# Patient Record
Sex: Female | Born: 1960 | Race: Black or African American | Hispanic: No
Health system: Southern US, Community
[De-identification: ages and names within clinical notes are randomized; demographics above are authoritative.]

## PROBLEM LIST (undated history)

## (undated) DIAGNOSIS — I350 Nonrheumatic aortic (valve) stenosis: Secondary | ICD-10-CM

## (undated) DIAGNOSIS — J449 Chronic obstructive pulmonary disease, unspecified: Secondary | ICD-10-CM

## (undated) DIAGNOSIS — E8809 Other disorders of plasma-protein metabolism, not elsewhere classified: Secondary | ICD-10-CM

## (undated) DIAGNOSIS — G8929 Other chronic pain: Secondary | ICD-10-CM

## (undated) DIAGNOSIS — M1990 Unspecified osteoarthritis, unspecified site: Secondary | ICD-10-CM

## (undated) DIAGNOSIS — I2510 Atherosclerotic heart disease of native coronary artery without angina pectoris: Secondary | ICD-10-CM

## (undated) DIAGNOSIS — L309 Dermatitis, unspecified: Secondary | ICD-10-CM

## (undated) DIAGNOSIS — F5104 Psychophysiologic insomnia: Secondary | ICD-10-CM

## (undated) DIAGNOSIS — N184 Chronic kidney disease, stage 4 (severe): Secondary | ICD-10-CM

## (undated) DIAGNOSIS — Z992 Dependence on renal dialysis: Secondary | ICD-10-CM

## (undated) DIAGNOSIS — B379 Candidiasis, unspecified: Secondary | ICD-10-CM

## (undated) DIAGNOSIS — I340 Nonrheumatic mitral (valve) insufficiency: Secondary | ICD-10-CM

## (undated) DIAGNOSIS — D649 Anemia, unspecified: Secondary | ICD-10-CM

## (undated) DIAGNOSIS — G4730 Sleep apnea, unspecified: Secondary | ICD-10-CM

## (undated) DIAGNOSIS — E669 Obesity, unspecified: Secondary | ICD-10-CM

## (undated) DIAGNOSIS — M549 Dorsalgia, unspecified: Secondary | ICD-10-CM

## (undated) DIAGNOSIS — D638 Anemia in other chronic diseases classified elsewhere: Secondary | ICD-10-CM

## (undated) DIAGNOSIS — N189 Chronic kidney disease, unspecified: Secondary | ICD-10-CM

## (undated) DIAGNOSIS — N186 End stage renal disease: Secondary | ICD-10-CM

## (undated) DIAGNOSIS — M109 Gout, unspecified: Secondary | ICD-10-CM

## (undated) DIAGNOSIS — J189 Pneumonia, unspecified organism: Secondary | ICD-10-CM

## (undated) DIAGNOSIS — F419 Anxiety disorder, unspecified: Secondary | ICD-10-CM

## (undated) DIAGNOSIS — Z8719 Personal history of other diseases of the digestive system: Secondary | ICD-10-CM

## (undated) DIAGNOSIS — K219 Gastro-esophageal reflux disease without esophagitis: Secondary | ICD-10-CM

## (undated) DIAGNOSIS — I050 Rheumatic mitral stenosis: Secondary | ICD-10-CM

## (undated) DIAGNOSIS — Z720 Tobacco use: Secondary | ICD-10-CM

## (undated) DIAGNOSIS — E871 Hypo-osmolality and hyponatremia: Secondary | ICD-10-CM

## (undated) DIAGNOSIS — I739 Peripheral vascular disease, unspecified: Secondary | ICD-10-CM

## (undated) HISTORY — DX: Dermatitis, unspecified: L30.9

## (undated) HISTORY — DX: Psychophysiologic insomnia: F51.04

## (undated) SURGERY — ESOPHAGOGASTRODUODENOSCOPY (EGD) WITH PROPOFOL
Anesthesia: Monitor Anesthesia Care | Laterality: Left

---

## 1986-02-10 HISTORY — PX: DILATION AND CURETTAGE OF UTERUS: SHX78

## 1988-02-11 HISTORY — PX: TUBAL LIGATION: SHX77

## 2006-12-30 ENCOUNTER — Emergency Department (HOSPITAL_COMMUNITY): Admission: EM | Admit: 2006-12-30 | Discharge: 2006-12-30 | Payer: Self-pay | Admitting: Emergency Medicine

## 2008-03-06 ENCOUNTER — Ambulatory Visit: Payer: Self-pay | Admitting: Family Medicine

## 2008-03-06 DIAGNOSIS — M545 Low back pain, unspecified: Secondary | ICD-10-CM | POA: Insufficient documentation

## 2008-03-06 DIAGNOSIS — E119 Type 2 diabetes mellitus without complications: Secondary | ICD-10-CM | POA: Insufficient documentation

## 2008-03-06 DIAGNOSIS — G4700 Insomnia, unspecified: Secondary | ICD-10-CM | POA: Insufficient documentation

## 2008-03-06 DIAGNOSIS — J45909 Unspecified asthma, uncomplicated: Secondary | ICD-10-CM | POA: Insufficient documentation

## 2008-03-06 DIAGNOSIS — L259 Unspecified contact dermatitis, unspecified cause: Secondary | ICD-10-CM | POA: Insufficient documentation

## 2008-03-06 DIAGNOSIS — I10 Essential (primary) hypertension: Secondary | ICD-10-CM | POA: Insufficient documentation

## 2008-03-06 LAB — CONVERTED CEMR LAB
Blood Glucose, Fingerstick: 264
Hgb A1c MFr Bld: 7.7 %

## 2008-04-24 ENCOUNTER — Telehealth (INDEPENDENT_AMBULATORY_CARE_PROVIDER_SITE_OTHER): Payer: Self-pay | Admitting: *Deleted

## 2008-05-11 ENCOUNTER — Emergency Department (HOSPITAL_COMMUNITY): Admission: EM | Admit: 2008-05-11 | Discharge: 2008-05-11 | Payer: Self-pay | Admitting: Emergency Medicine

## 2008-07-28 ENCOUNTER — Encounter
Admission: RE | Admit: 2008-07-28 | Discharge: 2008-08-01 | Payer: Self-pay | Admitting: Physical Medicine & Rehabilitation

## 2008-08-01 ENCOUNTER — Ambulatory Visit: Payer: Self-pay | Admitting: Physical Medicine & Rehabilitation

## 2008-08-09 ENCOUNTER — Ambulatory Visit (HOSPITAL_COMMUNITY)
Admission: RE | Admit: 2008-08-09 | Discharge: 2008-08-09 | Payer: Self-pay | Admitting: Physical Medicine & Rehabilitation

## 2008-08-10 ENCOUNTER — Ambulatory Visit (HOSPITAL_COMMUNITY)
Admission: RE | Admit: 2008-08-10 | Discharge: 2008-08-10 | Payer: Self-pay | Admitting: Physical Medicine & Rehabilitation

## 2009-01-20 ENCOUNTER — Emergency Department (HOSPITAL_COMMUNITY): Admission: EM | Admit: 2009-01-20 | Discharge: 2009-01-20 | Payer: Self-pay | Admitting: Emergency Medicine

## 2009-04-06 ENCOUNTER — Emergency Department (HOSPITAL_COMMUNITY): Admission: EM | Admit: 2009-04-06 | Discharge: 2009-04-06 | Payer: Self-pay | Admitting: Emergency Medicine

## 2010-03-16 ENCOUNTER — Emergency Department (HOSPITAL_COMMUNITY)
Admission: EM | Admit: 2010-03-16 | Discharge: 2010-03-16 | Disposition: A | Discharge status: Home / Self Care | Payer: Medicare HMO | Attending: Emergency Medicine | Admitting: Emergency Medicine

## 2010-03-16 DIAGNOSIS — J45909 Unspecified asthma, uncomplicated: Secondary | ICD-10-CM | POA: Insufficient documentation

## 2010-03-16 DIAGNOSIS — R51 Headache: Secondary | ICD-10-CM | POA: Insufficient documentation

## 2010-03-16 DIAGNOSIS — R1110 Vomiting, unspecified: Secondary | ICD-10-CM | POA: Insufficient documentation

## 2010-03-16 DIAGNOSIS — E119 Type 2 diabetes mellitus without complications: Secondary | ICD-10-CM | POA: Insufficient documentation

## 2010-03-16 DIAGNOSIS — H53149 Visual discomfort, unspecified: Secondary | ICD-10-CM | POA: Insufficient documentation

## 2010-03-16 DIAGNOSIS — I10 Essential (primary) hypertension: Secondary | ICD-10-CM | POA: Insufficient documentation

## 2010-03-16 LAB — POCT I-STAT, CHEM 8
BUN: 11 mg/dL (ref 6–23)
Calcium, Ion: 1.17 mmol/L (ref 1.12–1.32)
Chloride: 100 mEq/L (ref 96–112)
Creatinine, Ser: 0.9 mg/dL (ref 0.4–1.2)
Glucose, Bld: 213 mg/dL — ABNORMAL HIGH (ref 70–99)
HCT: 38 % (ref 36.0–46.0)
Hemoglobin: 12.9 g/dL (ref 12.0–15.0)
Potassium: 3.3 mEq/L — ABNORMAL LOW (ref 3.5–5.1)
Sodium: 138 mEq/L (ref 135–145)
TCO2: 28 mmol/L (ref 0–100)

## 2010-03-16 LAB — GLUCOSE, CAPILLARY: Glucose-Capillary: 237 mg/dL — ABNORMAL HIGH (ref 70–99)

## 2010-05-01 LAB — URINE CULTURE: Colony Count: 10000

## 2010-05-01 LAB — POCT I-STAT, CHEM 8
BUN: 8 mg/dL (ref 6–23)
Calcium, Ion: 1.21 mmol/L (ref 1.12–1.32)
Chloride: 101 mEq/L (ref 96–112)
Creatinine, Ser: 1 mg/dL (ref 0.4–1.2)
Glucose, Bld: 209 mg/dL — ABNORMAL HIGH (ref 70–99)
HCT: 35 % — ABNORMAL LOW (ref 36.0–46.0)
Hemoglobin: 11.9 g/dL — ABNORMAL LOW (ref 12.0–15.0)
Potassium: 3.8 mEq/L (ref 3.5–5.1)
Sodium: 134 mEq/L — ABNORMAL LOW (ref 135–145)
TCO2: 30 mmol/L (ref 0–100)

## 2010-05-01 LAB — URINALYSIS, ROUTINE W REFLEX MICROSCOPIC
Bilirubin Urine: NEGATIVE
Glucose, UA: NEGATIVE mg/dL
Hgb urine dipstick: NEGATIVE
Ketones, ur: 15 mg/dL — AB
Nitrite: NEGATIVE
Protein, ur: >300 mg/dL — AB
Specific Gravity, Urine: 1.03 (ref 1.005–1.030)
Urobilinogen, UA: 1 mg/dL (ref 0.0–1.0)
pH: 7.5 (ref 5.0–8.0)

## 2010-05-01 LAB — URINE MICROSCOPIC-ADD ON

## 2010-05-14 LAB — RAPID STREP SCREEN (MED CTR MEBANE ONLY): Streptococcus, Group A Screen (Direct): NEGATIVE

## 2010-06-25 NOTE — Group Therapy Note (Signed)
Jodi Bell is a 50 year old female, who has had back pain since 2002 in  a work-related injury.  She has reportedly had her last MRI in 2007.  I  do not have those records, but she states she left them at home.  She  can get these to [place].  She was told that she has L3, 4, 5 disk  herniations and nonsurgical approach was recommended.  She saw PT, she  had some acupuncture done.  She saw a chiropractor, all of which had  some minimal effect.  She has had epidural injections x2, one time was  helpful and the second time was not.   She has been back in the [place] area last year or so.  I do see  where she was seen in the ED in 2008 for hypertension.  More recently  she was seen in the ED on May 11, 2008, with chronic low back pain as  well as some hand pain and recommendations were to see a rheumatologist;  however, due to no funding did not go.  She has been seeing her primary  care physician initially at [facility], but then now at [facility].   Her average pain is about 8/10, the worse spots are her hips and her  hands as well as a left-sided low back.  Her pain is described as sharp,  stabbing, aching.  In addition she has some numbness in her feet.  She  is a diabetic for about last 3 years.  Sleep is fair.  Pain is worse  with walking, bending, sitting, and standing; improves with rest, heat,  and medications.  Relief from meds is fair.  She can walk 25 minutes at  a time.  She can climb steps.  She drives.  She needs assist with meal  prep, household duties, and shopping.   REVIEW OF SYSTEMS:  Positive for trouble walking, spasms, skin rash,  constipation, poor appetite, limb swelling, and asthma.   PAST MEDICAL HISTORY:  1. Diabetes.  2. Lung problems.  3. Thyroid.  4. High blood pressure.   SOCIAL HISTORY:  Divorced, lives with her sister.  Alcohol use, 2  glasses of wine per week, three-quarter pack smoker per day up until  recently quitting.   FAMILY  HISTORY:  1. Heart disease.  2. Lung disease.  3. Diabetes.  4. High blood pressure.   PHYSICAL EXAMINATION:  VITAL SIGNS:  Blood pressure 151/89, pulse 101,  respirations 18, and O2 sat 97% on room air.  GENERAL:  Overweight female in no acute distress.  NEUROLOGIC:  Orientation x3.  Affect is alert.  MUSCULOSKELETAL:  Gait is normal, although she moves slowly from sit to  stand.  She has good internal rotation and external rotation of the  shoulders.  She has mild deficits that end range for hip  internal/external rotation.  Elbow and hand range of motion are full,  although she states she cannot close her fingers all the way.  She has  good ankle range of motion.  Her deep tendon reflexes are 1+ bilateral  upper and lower extremities.  Sensation is diminished in the feet in a  stocking fashion.  Her hand show no evidence of joint swelling.  No  erythema.  She has a fine maculopapular rash on her back, this is  nonpruritic.  She has no other skin lesions noted.  Phalen and Tinel  signs are negative.   IMPRESSION:  1. Chronic low back pain  likely diskogenic.  2. Hip pain, question early osteoarthritis.  She is overweight.  3. Polyarthralgia, question whether this is inflammatory arthritis.      We will check hand films as well as arthritis panel.  Also in the      differential would include fibromyalgia, however, we first need to      rule out other sources.  Should she develop any joint pain, would      need to be further evaluated.   I discussed findings with the patient.  We will start on tramadol 50  t.i.d.  Non-narcotic management until UDS results are available.  Consider EMG NCV for lower extremities to further evaluate her numbness  in the feet, but this is most likely diabetic related.      Charlett Blake, M.D.  Electronically Signed     AEK/MedQ  D:  08/01/2008 09:40:12  T:  08/01/2008 23:10:21  Job #:  [id number]

## 2010-06-26 ENCOUNTER — Inpatient Hospital Stay (INDEPENDENT_AMBULATORY_CARE_PROVIDER_SITE_OTHER)
Admission: RE | Admit: 2010-06-26 | Discharge: 2010-06-26 | Disposition: A | Discharge status: Home / Self Care | Payer: Medicare HMO | Source: Ambulatory Visit | Attending: Family Medicine | Admitting: Family Medicine

## 2010-06-26 DIAGNOSIS — L02219 Cutaneous abscess of trunk, unspecified: Secondary | ICD-10-CM

## 2010-06-26 DIAGNOSIS — L03319 Cellulitis of trunk, unspecified: Secondary | ICD-10-CM

## 2010-06-30 ENCOUNTER — Emergency Department (HOSPITAL_COMMUNITY)
Admission: EM | Admit: 2010-06-30 | Discharge: 2010-06-30 | Disposition: A | Discharge status: Home / Self Care | Payer: Medicare HMO | Attending: Emergency Medicine | Admitting: Emergency Medicine

## 2010-06-30 DIAGNOSIS — G8929 Other chronic pain: Secondary | ICD-10-CM | POA: Insufficient documentation

## 2010-06-30 DIAGNOSIS — L089 Local infection of the skin and subcutaneous tissue, unspecified: Secondary | ICD-10-CM | POA: Insufficient documentation

## 2010-06-30 DIAGNOSIS — Z79899 Other long term (current) drug therapy: Secondary | ICD-10-CM | POA: Insufficient documentation

## 2010-06-30 DIAGNOSIS — Z7982 Long term (current) use of aspirin: Secondary | ICD-10-CM | POA: Insufficient documentation

## 2010-06-30 DIAGNOSIS — J45909 Unspecified asthma, uncomplicated: Secondary | ICD-10-CM | POA: Insufficient documentation

## 2010-06-30 DIAGNOSIS — M549 Dorsalgia, unspecified: Secondary | ICD-10-CM | POA: Insufficient documentation

## 2010-06-30 DIAGNOSIS — I10 Essential (primary) hypertension: Secondary | ICD-10-CM | POA: Insufficient documentation

## 2010-06-30 DIAGNOSIS — M25519 Pain in unspecified shoulder: Secondary | ICD-10-CM | POA: Insufficient documentation

## 2010-06-30 DIAGNOSIS — E119 Type 2 diabetes mellitus without complications: Secondary | ICD-10-CM | POA: Insufficient documentation

## 2010-11-06 ENCOUNTER — Encounter: Payer: Self-pay | Admitting: Gastroenterology

## 2010-11-21 ENCOUNTER — Encounter: Payer: Self-pay | Admitting: *Deleted

## 2010-11-29 ENCOUNTER — Ambulatory Visit: Payer: Medicare HMO | Admitting: Gastroenterology

## 2011-05-03 ENCOUNTER — Emergency Department (HOSPITAL_BASED_OUTPATIENT_CLINIC_OR_DEPARTMENT_OTHER)
Admission: EM | Admit: 2011-05-03 | Discharge: 2011-05-03 | Disposition: A | Discharge status: Home / Self Care | Payer: Medicare PPO | Attending: Emergency Medicine | Admitting: Emergency Medicine

## 2011-05-03 ENCOUNTER — Encounter (HOSPITAL_BASED_OUTPATIENT_CLINIC_OR_DEPARTMENT_OTHER): Payer: Self-pay | Admitting: Emergency Medicine

## 2011-05-03 ENCOUNTER — Emergency Department (INDEPENDENT_AMBULATORY_CARE_PROVIDER_SITE_OTHER): Payer: Medicare PPO

## 2011-05-03 DIAGNOSIS — Z79899 Other long term (current) drug therapy: Secondary | ICD-10-CM | POA: Insufficient documentation

## 2011-05-03 DIAGNOSIS — K219 Gastro-esophageal reflux disease without esophagitis: Secondary | ICD-10-CM | POA: Insufficient documentation

## 2011-05-03 DIAGNOSIS — M79609 Pain in unspecified limb: Secondary | ICD-10-CM

## 2011-05-03 DIAGNOSIS — Z862 Personal history of diseases of the blood and blood-forming organs and certain disorders involving the immune mechanism: Secondary | ICD-10-CM | POA: Insufficient documentation

## 2011-05-03 DIAGNOSIS — G4700 Insomnia, unspecified: Secondary | ICD-10-CM | POA: Insufficient documentation

## 2011-05-03 DIAGNOSIS — M25476 Effusion, unspecified foot: Secondary | ICD-10-CM | POA: Insufficient documentation

## 2011-05-03 DIAGNOSIS — M129 Arthropathy, unspecified: Secondary | ICD-10-CM | POA: Insufficient documentation

## 2011-05-03 DIAGNOSIS — I10 Essential (primary) hypertension: Secondary | ICD-10-CM | POA: Insufficient documentation

## 2011-05-03 DIAGNOSIS — Z8639 Personal history of other endocrine, nutritional and metabolic disease: Secondary | ICD-10-CM | POA: Insufficient documentation

## 2011-05-03 DIAGNOSIS — M25473 Effusion, unspecified ankle: Secondary | ICD-10-CM | POA: Insufficient documentation

## 2011-05-03 DIAGNOSIS — M79671 Pain in right foot: Secondary | ICD-10-CM

## 2011-05-03 DIAGNOSIS — J45909 Unspecified asthma, uncomplicated: Secondary | ICD-10-CM | POA: Insufficient documentation

## 2011-05-03 DIAGNOSIS — M7989 Other specified soft tissue disorders: Secondary | ICD-10-CM | POA: Insufficient documentation

## 2011-05-03 DIAGNOSIS — F17200 Nicotine dependence, unspecified, uncomplicated: Secondary | ICD-10-CM | POA: Insufficient documentation

## 2011-05-03 DIAGNOSIS — M7730 Calcaneal spur, unspecified foot: Secondary | ICD-10-CM

## 2011-05-03 HISTORY — DX: Gastro-esophageal reflux disease without esophagitis: K21.9

## 2011-05-03 HISTORY — DX: Candidiasis, unspecified: B37.9

## 2011-05-03 HISTORY — DX: Unspecified osteoarthritis, unspecified site: M19.90

## 2011-05-03 MED ORDER — HYDROCODONE-ACETAMINOPHEN 5-325 MG PO TABS
1.0000 | ORAL_TABLET | Freq: Once | ORAL | Status: AC
  Administered 2011-05-03: 1 via ORAL
  Filled 2011-05-03: qty 1

## 2011-05-03 MED ORDER — HYDROCODONE-ACETAMINOPHEN 5-325 MG PO TABS: 1.0000 | ORAL_TABLET | Freq: Four times a day (QID) | ORAL | Status: DC | PRN

## 2011-05-03 NOTE — ED Provider Notes (Signed)
History     CSN: [id number]  Arrival date & time 05/03/11  1123   First MD Initiated Contact with Patient 05/03/11 1254      Chief Complaint  Patient presents with  . Foot Pain    (Consider location/radiation/quality/duration/timing/severity/associated sxs/prior treatment) Patient is a 51 y.o. female presenting with lower extremity pain. The history is provided by the patient.  Foot Pain This is a new problem. The current episode started yesterday. The problem has been gradually worsening. Pertinent negatives include no chest pain, chills, diaphoresis, fever, joint swelling, myalgias, nausea, numbness or weakness. The symptoms are aggravated by walking. She has tried acetaminophen for the symptoms. The treatment provided mild relief.   Pt has no h/o gout.  No injury to her right foot.  She awoke yesterday morning with swelling on the dorsal aspect of her foot.  No erythema.  Pt is unable to take NSAIDs.  Past Medical History  Diagnosis Date  . Essential hypertension   . Eczema   . Asthma   . Diabetes mellitus   . Chronic insomnia   . Arthritis   . Candida infection   . GERD (gastroesophageal reflux disease)     Past Surgical History  Procedure Date  . Cesarean section   . Tubal ligation     Family History  Problem Relation Age of Onset  . Lung cancer Mother   . Diabetes Sister   . Heart disease Sister   . Thyroid cancer      1/2 sister  . Heart disease      1/2 sister    History  Substance Use Topics  . Smoking status: Current Everyday Smoker  . Smokeless tobacco: Not on file  . Alcohol Use: Yes    OB History    Grav Para Term Preterm Abortions TAB SAB Ect Mult Living                  Review of Systems  Constitutional: Negative for fever, chills and diaphoresis.  Cardiovascular: Negative for chest pain.  Gastrointestinal: Negative for nausea.  Musculoskeletal: Negative for myalgias and joint swelling.  Neurological: Negative for weakness and  numbness.  All other systems reviewed and are negative.    Allergies  Ace inhibitors  Home Medications   Current Outpatient Rx  Name Route Sig Dispense Refill  . ALBUTEROL SULFATE (2.5 MG/3ML) 0.083% IN NEBU Nebulization Take 2.5 mg by nebulization every 6 (six) hours as needed.    Marland Kitchen AMLODIPINE BESYLATE 10 MG PO TABS Oral Take 10 mg by mouth daily.    . ASPIRIN 81 MG PO TABS Oral Take 81 mg by mouth daily.    . CHLORTHALIDONE 25 MG PO TABS Oral Take 25 mg by mouth daily.    Marland Kitchen GABAPENTIN 100 MG PO CAPS Oral Take 100 mg by mouth 3 (three) times daily.    . GLYBURIDE 2.5 MG PO TABS Oral Take 2.5 mg by mouth 3 (three) times daily.    Marland Kitchen LISINOPRIL 20 MG PO TABS Oral Take 20 mg by mouth daily.    Marland Kitchen METFORMIN HCL 1000 MG PO TABS Oral Take 1,000 mg by mouth 2 (two) times daily with a meal.    . METHOTREXATE CHEMO INJECTION (PF) 1 GM **50 MG/ML** Intravenous Inject into the vein once a week.    Marland Kitchen MONTELUKAST SODIUM 10 MG PO TABS Oral Take 10 mg by mouth at bedtime.    . OXYCODONE HCL 15 MG PO TABS Oral Take 15 mg by  mouth 2 (two) times daily as needed.    Marland Kitchen PANTOPRAZOLE SODIUM 40 MG PO TBEC Oral Take 40 mg by mouth daily.    Marland Kitchen PREDNISONE 5 MG PO TABS Oral Take 7.5 mg by mouth daily.    Marland Kitchen ZOLPIDEM TARTRATE 10 MG PO TABS Oral Take 10 mg by mouth at bedtime as needed.    Marland Kitchen HYDROCODONE-ACETAMINOPHEN 5-325 MG PO TABS Oral Take 1 tablet by mouth every 6 (six) hours as needed for pain. 20 tablet 0    BP 149/87  Pulse 88  Temp(Src) 97.9 F (36.6 C) (Oral)  Resp 16  SpO2 97%  Physical Exam  Nursing note and vitals reviewed. Constitutional: She is oriented to person, place, and time. She appears well-developed and well-nourished.  HENT:  Head: Normocephalic and atraumatic.  Eyes: Conjunctivae and EOM are normal. Pupils are equal, round, and reactive to light.  Neck: Normal range of motion. Neck supple.  Cardiovascular: Normal rate and regular rhythm.   Pulmonary/Chest: Effort normal and  breath sounds normal.  Abdominal: Soft. Bowel sounds are normal. She exhibits no distension. There is no tenderness.  Musculoskeletal: Normal range of motion.       Right ankle: She exhibits swelling.       Feet:  Lymphadenopathy:    She has no cervical adenopathy.  Neurological: She is alert and oriented to person, place, and time. She has normal reflexes.  Skin: Skin is warm and dry. No rash noted. No erythema.  Psychiatric: She has a normal mood and affect. Her behavior is normal.    ED Course  Procedures (including critical care time)  Labs Reviewed - No data to display Dg Foot Complete Right  05/03/2011  *RADIOLOGY REPORT*  Clinical Data: 3-day history of right foot pain bilaterally.  No known injury.  RIGHT FOOT COMPLETE - 3+ VIEW 1013:  Comparison: None.  Findings: No evidence of acute or subacute fracture or dislocation. Well-preserved bone mineral density.  Well-preserved joint spaces. Moderate sized plantar calcaneal spur.  Spur/enthesopathy at the insertion of the Achilles tendon on the posterior calcaneus. Accessory ossicles adjacent to the navicular and cuboid bones. Calcification involving the small arteries of the foot as can be seen in patients with diabetes and/or end-stage renal disease.  IMPRESSION: No acute or subacute osseous abnormality.  Moderate sized plantar calcaneal spur.  Original Report Authenticated By: Deniece Portela, M.D.     1. Foot pain, right       MDM  Reviewed pt's xrays which showed no acute abnormality.  Advised RICE.  Given Norco since pt is unable to take NSAIDs.  Advised to f/u with PCP if sx persist or worsen.  If pt develops leg swelling or more severe pain, return to the ED.  Pt voiced understanding and compliance.        [place], [place] 05/03/11 1415

## 2011-05-03 NOTE — ED Notes (Signed)
Pt having right sided foot pain since Wednesday.  No known injury.  Pt states it seems to be getting worse.  Pt states no bruising or redness, just sore and some swelling.

## 2011-05-04 NOTE — ED Provider Notes (Signed)
History/physical exam/procedure(s) were performed by non-physician practitioner and as supervising physician I was immediately available for consultation/collaboration. I have reviewed all notes and am in agreement with care and plan.   Shaune Pollack, MD 05/04/11 [phone]

## 2011-05-06 ENCOUNTER — Ambulatory Visit (INDEPENDENT_AMBULATORY_CARE_PROVIDER_SITE_OTHER): Payer: Medicare PPO | Admitting: Family Medicine

## 2011-05-06 VITALS — BP 144/85 | HR 91 | Temp 98.0°F | Resp 16 | Ht 62.5 in | Wt 196.8 lb

## 2011-05-06 DIAGNOSIS — M25579 Pain in unspecified ankle and joints of unspecified foot: Secondary | ICD-10-CM

## 2011-05-06 DIAGNOSIS — L4050 Arthropathic psoriasis, unspecified: Secondary | ICD-10-CM

## 2011-05-06 DIAGNOSIS — M129 Arthropathy, unspecified: Secondary | ICD-10-CM

## 2011-05-06 DIAGNOSIS — M1990 Unspecified osteoarthritis, unspecified site: Secondary | ICD-10-CM

## 2011-05-06 LAB — POCT CBC
Granulocyte percent: 78.3 %G (ref 37–80)
HCT, POC: 34.3 % — AB (ref 37.7–47.9)
Hemoglobin: 10.8 g/dL — AB (ref 12.2–16.2)
Lymph, poc: 1.9 (ref 0.6–3.4)
MCH, POC: 28.1 pg (ref 27–31.2)
MCHC: 31.5 g/dL — AB (ref 31.8–35.4)
MCV: 89.3 fL (ref 80–97)
MID (cbc): 0.8 (ref 0–0.9)
MPV: 7.4 fL (ref 0–99.8)
POC Granulocyte: 9.6 — AB (ref 2–6.9)
POC LYMPH PERCENT: 15.2 %L (ref 10–50)
POC MID %: 6.5 %M (ref 0–12)
Platelet Count, POC: 410 10*3/uL (ref 142–424)
RBC: 3.84 M/uL — AB (ref 4.04–5.48)
RDW, POC: 19 %
WBC: 12.2 10*3/uL — AB (ref 4.6–10.2)

## 2011-05-06 LAB — POCT SEDIMENTATION RATE: POCT SED RATE: 87 mm/hr — AB (ref 0–22)

## 2011-05-06 LAB — URIC ACID: Uric Acid, Serum: 11.1 mg/dL — ABNORMAL HIGH (ref 2.4–7.0)

## 2011-05-06 MED ORDER — COLCHICINE 0.6 MG PO TABS: ORAL_TABLET | ORAL | Status: DC

## 2011-05-06 MED ORDER — CEPHALEXIN 500 MG PO CAPS: 500.0000 mg | ORAL_CAPSULE | Freq: Three times a day (TID) | ORAL | Status: AC

## 2011-05-06 NOTE — Progress Notes (Signed)
  Subjective:    Patient ID: Jodi Bell, female    DOB: [birth year], 51 y.o.   MRN: [id number]  Foot Pain Pertinent negatives include no chills or fever.    Patient presents with 6 days history of progressive pain, redness and swelling involving (R) foot. Seen at [facility] on Saturday X ray's were taken and were negative.  Patient was concerned that she was  developing cellulitis and started herself on leftover Keflex.  Called her Rheumatologist(Dr. Firefighter) for antibiotic  refill however he requested patient to be seen for evaluation of possible cellulitis verses inflammatory arthropathy.   No history of gout; known psoriatic arthropathy  Recent EGD; biopsies showed candidal infection. Patient currently completing course of Nystatin Review of Systems  Constitutional: Negative for fever and chills.       Objective:   Physical Exam  Constitutional: She appears well-developed and well-nourished.  Cardiovascular: Normal rate, regular rhythm, normal heart sounds and intact distal pulses.   Pulmonary/Chest: Effort normal and breath sounds normal.  Musculoskeletal:       Right foot: She exhibits tenderness and swelling.       Feet:  Neurological: She is alert.  Skin: There is erythema.  Psychiatric: She has a normal mood and affect.     Results for orders placed in visit on 05/06/11  POCT CBC      Component Value Range   WBC 12.2 (*) 4.6 - 10.2 (K/uL)   Lymph, poc 1.9  0.6 - 3.4    POC LYMPH PERCENT 15.2  10 - 50 (%L)   MID (cbc) 0.8  0 - 0.9    POC MID % 6.5  0 - 12 (%M)   POC Granulocyte 9.6 (*) 2 - 6.9    Granulocyte percent 78.3  37 - 80 (%G)   RBC 3.84 (*) 4.04 - 5.48 (M/uL)   Hemoglobin 10.8 (*) 12.2 - 16.2 (g/dL)   HCT, POC 34.3 (*) 37.7 - 47.9 (%)   MCV 89.3  80 - 97 (fL)   MCH, POC 28.1  27 - 31.2 (pg)   MCHC 31.5 (*) 31.8 - 35.4 (g/dL)   RDW, POC 19.0     Platelet Count, POC 410  142 - 424 (K/uL)   MPV 7.4  0 - 99.8 (fL)  POCT SEDIMENTATION RATE      Component  Value Range   POCT SED RATE 87 (*) 0 - 22 (mm/hr)        Assessment & Plan:   1. Psoriasis arthropathica    2. 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

## 2011-07-17 ENCOUNTER — Emergency Department (HOSPITAL_COMMUNITY)
Admission: EM | Admit: 2011-07-17 | Discharge: 2011-07-17 | Disposition: A | Discharge status: Home / Self Care | Payer: Medicare PPO | Attending: Emergency Medicine | Admitting: Emergency Medicine

## 2011-07-17 ENCOUNTER — Encounter (HOSPITAL_COMMUNITY): Payer: Self-pay | Admitting: *Deleted

## 2011-07-17 ENCOUNTER — Emergency Department (HOSPITAL_COMMUNITY): Payer: Medicare PPO

## 2011-07-17 DIAGNOSIS — J45909 Unspecified asthma, uncomplicated: Secondary | ICD-10-CM | POA: Insufficient documentation

## 2011-07-17 DIAGNOSIS — G4700 Insomnia, unspecified: Secondary | ICD-10-CM | POA: Insufficient documentation

## 2011-07-17 DIAGNOSIS — I10 Essential (primary) hypertension: Secondary | ICD-10-CM | POA: Insufficient documentation

## 2011-07-17 DIAGNOSIS — M25572 Pain in left ankle and joints of left foot: Secondary | ICD-10-CM

## 2011-07-17 DIAGNOSIS — F17200 Nicotine dependence, unspecified, uncomplicated: Secondary | ICD-10-CM | POA: Insufficient documentation

## 2011-07-17 DIAGNOSIS — M25579 Pain in unspecified ankle and joints of unspecified foot: Secondary | ICD-10-CM | POA: Insufficient documentation

## 2011-07-17 DIAGNOSIS — E119 Type 2 diabetes mellitus without complications: Secondary | ICD-10-CM | POA: Insufficient documentation

## 2011-07-17 DIAGNOSIS — K219 Gastro-esophageal reflux disease without esophagitis: Secondary | ICD-10-CM | POA: Insufficient documentation

## 2011-07-17 DIAGNOSIS — Z79899 Other long term (current) drug therapy: Secondary | ICD-10-CM | POA: Insufficient documentation

## 2011-07-17 DIAGNOSIS — Z8739 Personal history of other diseases of the musculoskeletal system and connective tissue: Secondary | ICD-10-CM | POA: Insufficient documentation

## 2011-07-17 MED ORDER — HYDROCODONE-ACETAMINOPHEN 5-325 MG PO TABS
1.0000 | ORAL_TABLET | Freq: Once | ORAL | Status: AC
  Administered 2011-07-17: 1 via ORAL
  Filled 2011-07-17: qty 1

## 2011-07-17 MED ORDER — HYDROCODONE-ACETAMINOPHEN 5-325 MG PO TABS: 1.0000 | ORAL_TABLET | Freq: Four times a day (QID) | ORAL | Status: AC | PRN

## 2011-07-17 NOTE — ED Provider Notes (Signed)
History     CSN: [id number]  Arrival date & time 07/17/11  1605   First MD Initiated Contact with Patient 07/17/11 1637      Chief Complaint  Patient presents with  . Ankle Pain    (Consider location/radiation/quality/duration/timing/severity/associated sxs/prior treatment) HPI Comments: Patient reports she previously tore a tendon in her left ankle after a muscle spasm about 1 year ago - diagnosed by MRI, treated conservatively with boot.  Pt states yesterday she woke up with increased swelling in her lateral left ankle.  Reports pain is constant, throbbing, radiates into left lower leg, exacerbated by palpation or applying pressure to her foot.  Denies foot, calf, or knee pain.  Denies any injury.  Pt states she does have chronic pain in this ankle when the weather is bad, but never like this.  Denies fevers.    Patient is a 51 y.o. female presenting with ankle pain. The history is provided by the patient.  Ankle Pain  Pertinent negatives include no numbness.    Past Medical History  Diagnosis Date  . Essential hypertension   . Eczema   . Asthma   . Diabetes mellitus   . Chronic insomnia   . Arthritis   . Candida infection   . GERD (gastroesophageal reflux disease)     Past Surgical History  Procedure Date  . Cesarean section   . Tubal ligation     Family History  Problem Relation Age of Onset  . Lung cancer Mother   . Diabetes Sister   . Heart disease Sister   . Thyroid cancer      1/2 sister  . Heart disease      1/2 sister    History  Substance Use Topics  . Smoking status: Current Everyday Smoker  . Smokeless tobacco: Not on file  . Alcohol Use: Yes    OB History    Grav Para Term Preterm Abortions TAB SAB Ect Mult Living                  Review of Systems  Musculoskeletal: Negative for back pain.  Skin: Negative for wound.  Neurological: Negative for weakness and numbness.    Allergies  Ace inhibitors  Home Medications   Current  Outpatient Rx  Name Route Sig Dispense Refill  . ALBUTEROL SULFATE HFA 108 (90 BASE) MCG/ACT IN AERS Inhalation Inhale 2 puffs into the lungs every 6 (six) hours as needed. For shortness of breath.    . ALBUTEROL SULFATE (2.5 MG/3ML) 0.083% IN NEBU Nebulization Take 2.5 mg by nebulization every 6 (six) hours as needed. For shortness of breath.    . ALLOPURINOL 100 MG PO TABS Oral Take 100 mg by mouth daily.    Marland Kitchen AMLODIPINE BESYLATE 10 MG PO TABS Oral Take 10 mg by mouth daily.    . ASPIRIN EC 81 MG PO TBEC Oral Take 81 mg by mouth daily.    Marland Kitchen BISOPROLOL FUMARATE 10 MG PO TABS Oral Take 20 mg by mouth daily.    . CHLORTHALIDONE 25 MG PO TABS Oral Take 25 mg by mouth daily.    Marland Kitchen GABAPENTIN 100 MG PO CAPS Oral Take 100-200 mg by mouth 3 (three) times daily. Take 100 MG twice daily, and 200 MG at bedtime.    . GLYBURIDE 2.5 MG PO TABS Oral Take 2.5-7.5 mg by mouth 2 (two) times daily with a meal. Take 7.5 MG in the morning and 2.5 MG at night.    Marland Kitchen  LISINOPRIL 20 MG PO TABS Oral Take 20 mg by mouth daily.    Marland Kitchen METFORMIN HCL 1000 MG PO TABS Oral Take 1,000 mg by mouth 2 (two) times daily with a meal.    . METHOTREXATE SODIUM CHEMO INJECTION 25 MG/ML PF Intravenous Inject 15 mg into the vein every 7 (seven) days. Inject on Saturdays. 15 MG = 0.6 mL    . MONTELUKAST SODIUM 10 MG PO TABS Oral Take 10 mg by mouth at bedtime.    . OXYCODONE HCL 15 MG PO TABS Oral Take 15 mg by mouth 2 (two) times daily as needed. For pain.    Marland Kitchen PANTOPRAZOLE SODIUM 40 MG PO TBEC Oral Take 40 mg by mouth daily.    Marland Kitchen PREDNISONE 5 MG PO TABS Oral Take 7.5 mg by mouth daily.    Marland Kitchen ZOLPIDEM TARTRATE 10 MG PO TABS Oral Take 10 mg by mouth at bedtime as needed. For sleep.      BP 126/79  Pulse 89  Temp(Src) 98.2 F (36.8 C) (Oral)  Resp 22  SpO2 97%  Physical Exam  Nursing note and vitals reviewed. Constitutional: She appears well-developed and well-nourished. No distress.  HENT:  Head: Normocephalic and atraumatic.    Neck: Neck supple.  Pulmonary/Chest: Effort normal.  Musculoskeletal:       Left knee: Normal.       Left ankle: She exhibits decreased range of motion and swelling. She exhibits no ecchymosis, no deformity, no laceration and normal pulse. tenderness. Lateral malleolus tenderness found. Achilles tendon normal.       Left lower leg: Normal.       Left foot: Normal.       Feet:  Neurological: She is alert.  Skin: She is not diaphoretic.    ED Course  Procedures (including critical care time)  Labs Reviewed - No data to display Dg Ankle Complete Left  07/17/2011  *RADIOLOGY REPORT*  Clinical Data: Lateral pain, remote injury  LEFT ANKLE COMPLETE - 3+ VIEW  Comparison: None  Findings: Soft tissue swelling laterally. Osseous mineralization normal. Joint spaces preserved. Plantar and Achilles insertion calcaneal spur formation. Scattered small vessel vascular calcifications. No acute fracture, dislocation, or bone destruction.  IMPRESSION: No acute osseous abnormalities. Calcaneal spurring.  Original Report Authenticated By: Burnetta Sabin, M.D.   6:11 PM Discussed results with patient.    1. Pain in left ankle       MDM  Patient with swelling and tenderness over lateral malleolus without injury, previous tendon injury in the same location last year without known injury.  Xray is negative.  Pt placed in ASO, given crutches, d/c home with norco #15 and orthopedic follow up.  Return precautions, care instructions given.  Patient verbalizes understanding and agrees with plan.          [place], [place] 07/17/11 2038

## 2011-07-17 NOTE — Discharge Instructions (Signed)
Read the information below.  Follow the RICE treatment as outlined in the attachments.  If you develop fevers, swelling that involves your entire leg, redness, or uncontrolled pain, return to the ER for a recheck.  You may return to the ER at any time for worsening condition or any new symptoms that concern you.   Ankle Pain Ankle pain is a common symptom. The bones, cartilage, tendons, and muscles of the ankle joint perform a lot of work each day. The ankle joint holds your body weight and allows you to move around. Ankle pain can occur on either side or back of 1 or both ankles. Ankle pain may be sharp and burning or dull and aching. There may be tenderness, stiffness, redness, or warmth around the ankle. The pain occurs more often when a person walks or puts pressure on the ankle. CAUSES  There are many reasons ankle pain can develop. It is important to work with your caregiver to identify the cause since many conditions can impact the bones, cartilage, muscles, and tendons. Causes for ankle pain include:  Injury, including a break (fracture), sprain, or strain often due to a fall, sports, or a high-impact activity.   Swelling (inflammation) of a tendon (tendonitis).   Achilles tendon rupture.   Ankle instability after repeated sprains and strains.   Poor foot alignment.   Pressure on a nerve (tarsal tunnel syndrome).   Arthritis in the ankle or the lining of the ankle.   Crystal formation in the ankle (gout or pseudogout).  DIAGNOSIS  A diagnosis is based on your medical history, your symptoms, results of your physical exam, and results of diagnostic tests. Diagnostic tests may include X-ray exams or a computerized magnetic scan (magnetic resonance imaging, MRI). TREATMENT  Treatment will depend on the cause of your ankle pain and may include:  Keeping pressure off the ankle and limiting activities.   Using crutches or other walking support (a cane or brace).   Using rest, ice,  compression, and elevation.   Participating in physical therapy or home exercises.   Wearing shoe inserts or special shoes.   Losing weight.   Taking medications to reduce pain or swelling or receiving an injection.   Undergoing surgery.  HOME CARE INSTRUCTIONS   Only take over-the-counter or prescription medicines for pain, discomfort, or fever as directed by your caregiver.   Put ice on the injured area.   Put ice in a plastic bag.   Place a towel between your skin and the bag.   Leave the ice on for 15 to 20 minutes at a time, 3 to 4 times a day.   Keep your leg raised (elevated) when possible to lessen swelling.   Avoid activities that cause ankle pain.   Follow specific exercises as directed by your caregiver.   Record how often you have ankle pain, the location of the pain, and what it feels like. This information may be helpful to you and your caregiver.   Ask your caregiver about returning to work or sports and whether you should drive.   Follow up with your caregiver for further examination, therapy, or testing as directed.  SEEK MEDICAL CARE IF:   Pain or swelling continues or worsens beyond 1 week.   You have an oral temperature above 102 F (38.9 C).   You are feeling unwell or have chills.   You are having an increasingly difficult time with walking.   You have loss of sensation or other new  symptoms.   You have questions or concerns.  MAKE SURE YOU:   Understand these instructions.   Will watch your condition.   Will get help right away if you are not doing well or get worse.  Document Released: 07/17/2009 Document Revised: 01/16/2011 Document Reviewed: 07/17/2009 [facility] Patient Information 2012 [place].

## 2011-07-17 NOTE — ED Notes (Signed)
Lt ankle pain since yesterday.  No known injury she had tendon repair one year ago

## 2011-07-17 NOTE — Progress Notes (Signed)
Orthopedic Tech Progress Note Patient Details:  ROBINE GASKAMP [birth year] [id number]  Ortho Devices Type of Ortho Device: Crutches;ASO Ortho Device/Splint Location: (L) LE Ortho Device/Splint Interventions: Application   Braulio Bosch 07/17/2011, 6:42 PM

## 2011-07-20 NOTE — ED Provider Notes (Signed)
Medical screening examination/treatment/procedure(s) were performed by non-physician practitioner and as supervising physician I was immediately available for consultation/collaboration.   Maudry Diego, MD 07/20/11 1600

## 2011-09-18 ENCOUNTER — Ambulatory Visit (INDEPENDENT_AMBULATORY_CARE_PROVIDER_SITE_OTHER): Payer: Medicare PPO | Admitting: Family Medicine

## 2011-09-18 VITALS — BP 132/90 | HR 88 | Temp 98.3°F | Resp 16 | Ht 62.5 in | Wt 192.6 lb

## 2011-09-18 DIAGNOSIS — F419 Anxiety disorder, unspecified: Secondary | ICD-10-CM

## 2011-09-18 DIAGNOSIS — E119 Type 2 diabetes mellitus without complications: Secondary | ICD-10-CM

## 2011-09-18 DIAGNOSIS — N289 Disorder of kidney and ureter, unspecified: Secondary | ICD-10-CM

## 2011-09-18 DIAGNOSIS — F411 Generalized anxiety disorder: Secondary | ICD-10-CM

## 2011-09-18 LAB — POCT CBC
Granulocyte percent: 71 %G (ref 37–80)
HCT, POC: 37.1 % — AB (ref 37.7–47.9)
Hemoglobin: 10.8 g/dL — AB (ref 12.2–16.2)
Lymph, poc: 2.3 (ref 0.6–3.4)
MCH, POC: 27 pg (ref 27–31.2)
MCHC: 29.1 g/dL — AB (ref 31.8–35.4)
MCV: 92.8 fL (ref 80–97)
MID (cbc): 0.7 (ref 0–0.9)
MPV: 8.1 fL (ref 0–99.8)
POC Granulocyte: 7.3 — AB (ref 2–6.9)
POC LYMPH PERCENT: 22.6 %L (ref 10–50)
POC MID %: 6.4 %M (ref 0–12)
Platelet Count, POC: 465 10*3/uL — AB (ref 142–424)
RBC: 4 M/uL — AB (ref 4.04–5.48)
RDW, POC: 16.7 %
WBC: 10.3 10*3/uL — AB (ref 4.6–10.2)

## 2011-09-18 LAB — COMPREHENSIVE METABOLIC PANEL
ALT: 37 U/L — ABNORMAL HIGH (ref 0–35)
AST: 28 U/L (ref 0–37)
Albumin: 4.3 g/dL (ref 3.5–5.2)
Alkaline Phosphatase: 65 U/L (ref 39–117)
BUN: 25 mg/dL — ABNORMAL HIGH (ref 6–23)
CO2: 28 mEq/L (ref 19–32)
Calcium: 10.7 mg/dL — ABNORMAL HIGH (ref 8.4–10.5)
Chloride: 98 mEq/L (ref 96–112)
Creat: 1.34 mg/dL — ABNORMAL HIGH (ref 0.50–1.10)
Glucose, Bld: 148 mg/dL — ABNORMAL HIGH (ref 70–99)
Potassium: 3.7 mEq/L (ref 3.5–5.3)
Sodium: 137 mEq/L (ref 135–145)
Total Bilirubin: 0.3 mg/dL (ref 0.3–1.2)
Total Protein: 7.5 g/dL (ref 6.0–8.3)

## 2011-09-18 LAB — GLUCOSE, POCT (MANUAL RESULT ENTRY): POC Glucose: 143 mg/dl — AB (ref 70–99)

## 2011-09-18 LAB — T4: T4, Total: 10.3 ug/dL (ref 5.0–12.5)

## 2011-09-18 LAB — T3, FREE: T3, Free: 2.3 pg/mL (ref 2.3–4.2)

## 2011-09-18 LAB — TSH: TSH: 3.043 u[IU]/mL (ref 0.350–4.500)

## 2011-09-18 LAB — POCT GLYCOSYLATED HEMOGLOBIN (HGB A1C): Hemoglobin A1C: 7.3

## 2011-09-18 MED ORDER — ALPRAZOLAM 0.25 MG PO TABS: ORAL_TABLET | ORAL | Status: DC

## 2011-09-18 NOTE — Progress Notes (Addendum)
[facility] [address] [place] [address] [phone]  Date:  09/18/2011   Name:  Jodi Bell   DOB:  1960-09-25   MRN:  [id number]  PCP:  William Hamburger, MD    Chief Complaint: Anxiety   History of Present Illness:  Jodi Bell is a 51 y.o. very pleasant female patient who presents with the following:  She stopped smoking about 6 weeks ago- came off chantix about a week and a half ago.  However, she notes that she feels "very nervous" over the last 2 days.  She has still been using her Ambien but this is not helping her sleep- she has slept very little.  "Just can't get my mind to shut off."    She also notes "menstual cramps" in her chest, she has had this in the past- when she was in her 3s.  It feels "like a spasm.'  Not having this pain now- it just occurs when she is laying down and trying to sleep.   She also notes tingling in her bilateral hands for the last 2 days- only when supine.  Goes away when she sits up.   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  She had an iodine treatment for her thyroid and has not had the problem since.   She has been under some stress lately- she has used some xanax in the past during hard times.    She has HTN, but no other history of CAD.   She also has noted nausea.  She saw GI a few months ago and had an endoscopy- she was diagnosed with thrush.  This problem has resolved, but she has noted nausea just for the past 2 days.    Menopausal for the last 3 or 4 years  Hg 3/13 was 10.8  Patient Active Problem List  Diagnosis  . DIABETES MELLITUS, TYPE II  . INSOMNIA, CHRONIC  . ESSENTIAL HYPERTENSION  . ASTHMA  . ECZEMA  . BACK PAIN, LUMBAR, CHRONIC  . Psoriasis arthropathica    Past Medical History  Diagnosis Date  . Essential hypertension   . Eczema   . Asthma   . Diabetes mellitus   . Chronic insomnia   . Arthritis   . Candida infection   . GERD (gastroesophageal  reflux disease)     Past Surgical History  Procedure Date  . Cesarean section   . Tubal ligation     History  Substance Use Topics  . Smoking status: Former Smoker    Quit date: 07/19/2011  . Smokeless tobacco: Not on file  . Alcohol Use: Yes    Family History  Problem Relation Age of Onset  . Lung cancer Mother   . Diabetes Sister   . Heart disease Sister   . Thyroid cancer      1/2 sister  . Heart disease      1/2 sister    Allergies  Allergen Reactions  . Ace Inhibitors     REACTION: facial swelling(per pt)    Medication list has been reviewed and updated.  Current Outpatient Prescriptions on File Prior to Visit  Medication Sig Dispense Refill  . albuterol (PROVENTIL HFA;VENTOLIN HFA) 108 (90 BASE) MCG/ACT inhaler Inhale 2 puffs into the lungs every 6 (six) hours as needed. For shortness of breath.      . allopurinol (ZYLOPRIM) 100 MG tablet Take 100 mg by mouth daily.      Marland Kitchen  amLODipine (NORVASC) 10 MG tablet Take 10 mg by mouth daily.      Marland Kitchen aspirin EC 81 MG tablet Take 81 mg by mouth daily.      . bisoprolol (ZEBETA) 10 MG tablet Take 20 mg by mouth daily.      . chlorthalidone (HYGROTON) 25 MG tablet Take 25 mg by mouth daily.      Marland Kitchen gabapentin (NEURONTIN) 100 MG capsule Take 100-200 mg by mouth 3 (three) times daily. Take 100 MG twice daily, and 200 MG at bedtime.      Marland Kitchen glyBURIDE (DIABETA) 2.5 MG tablet Take 2.5-7.5 mg by mouth 2 (two) times daily with a meal. Take 7.5 MG in the morning and 2.5 MG at night.      Marland Kitchen lisinopril (PRINIVIL,ZESTRIL) 20 MG tablet Take 20 mg by mouth daily.      . metFORMIN (GLUCOPHAGE) 1000 MG tablet Take 1,000 mg by mouth 2 (two) times daily with a meal.      . methotrexate 25 MG/ML SOLN Inject 15 mg into the vein every 7 (seven) days. Inject on Saturdays. 15 MG = 0.6 mL      . montelukast (SINGULAIR) 10 MG tablet Take 10 mg by mouth at bedtime.      Marland Kitchen oxyCODONE (ROXICODONE) 15 MG immediate release tablet Take 15 mg by mouth 2  (two) times daily as needed. For pain.      . pantoprazole (PROTONIX) 40 MG tablet Take 40 mg by mouth daily.      . predniSONE (DELTASONE) 5 MG tablet Take 7.5 mg by mouth daily.      Marland Kitchen zolpidem (AMBIEN) 10 MG tablet Take 10 mg by mouth at bedtime as needed. For sleep.      Marland Kitchen albuterol (PROVENTIL) (2.5 MG/3ML) 0.083% nebulizer solution Take 2.5 mg by nebulization every 6 (six) hours as needed. For shortness of breath.        Review of Systems:  As per HPI- otherwise negative.   Physical Examination: Filed Vitals:   09/18/11 1327  BP: 132/90  Pulse: 88  Temp: 98.3 F (36.8 C)  Resp: 16   Filed Vitals:   09/18/11 1327  Height: 5' 2.5" (1.588 m)  Weight: 192 lb 9.6 oz (87.363 kg)   Rechecked her pulse ox personally- 100%, pulse 74 Body mass index is 34.67 kg/(m^2). Ideal Body Weight: Weight in (lb) to have BMI = 25: 138.6   GEN: WDWN, NAD, Non-toxic, A & O x 3, obese HEENT: Atraumatic, Normocephalic. Neck supple. No masses, No LAD.  TM and oropharynx wnl, PEERL, EOMI, PEERL, EOMI Ears and Nose: No external deformity. CV: RRR, No M/G/R. No JVD. No thrill. No extra heart sounds. PULM: CTA B, no wheezes, crackles, rhonchi. No retractions. No resp. distress. No accessory muscle use. ABD: S, NT, ND, +BS. No rebound. No HSM. Trunk: able to reproduce soreness in her bilateral pectoralis muscles.  EXTR: No c/c/e.  No abnormalities of hands- no swelling or heat.   NEURO Normal gait.  PSYCH: Normally interactive. Conversant. Mildly anxious  Results for orders placed in visit on 09/18/11  POCT CBC      Component Value Range   WBC 10.3 (*) 4.6 - 10.2 K/uL   Lymph, poc 2.3  0.6 - 3.4   POC LYMPH PERCENT 22.6  10 - 50 %L   MID (cbc) 0.7  0 - 0.9   POC MID % 6.4  0 - 12 %M   POC Granulocyte 7.3 (*) 2 - 6.9  Granulocyte percent 71.0  37 - 80 %G   RBC 4.00 (*) 4.04 - 5.48 M/uL   Hemoglobin 10.8 (*) 12.2 - 16.2 g/dL   HCT, POC 37.1 (*) 37.7 - 47.9 %   MCV 92.8  80 - 97 fL   MCH,  POC 27.0  27 - 31.2 pg   MCHC 29.1 (*) 31.8 - 35.4 g/dL   RDW, POC 16.7     Platelet Count, POC 465 (*) 142 - 424 K/uL   MPV 8.1  0 - 99.8 fL  GLUCOSE, POCT (MANUAL RESULT ENTRY)      Component Value Range   POC Glucose 143 (*) 70 - 99 mg/dl  POCT GLYCOSYLATED HEMOGLOBIN (HGB A1C)      Component Value Range   Hemoglobin A1C 7.3     EKG: NSR, no ST elevation or depression Went over criteria for thyroid storm- she does not fit these criteria Assessment and Plan: 1. Anxiety  EKG 12-Lead, POCT CBC, POCT glucose (manual entry), POCT glycosylated hemoglobin (Hb A1C), Comprehensive metabolic panel, TSH, ALPRAZolam (XANAX) 0.25 MG tablet, T3, Free, T4   Suspect that Erica's symtoms are due to anxiety- she has had these type of symptoms with anxiety in the past.  Will try a low dose of xanax as needed for a few days.  She will let me know if not feeling better- right away if her symptoms get worse or change  Anemia- she has not yet had a colonoscopy.  Stressed the importance of having this test as chronic anemia can be a warning sign of colon problems/ bleeding.    Will follow-up with her other labs- however she certainly is not in thyroid storm as she has no fever, tachycardia, elevated BP, delirium, or other diagnostic symptoms.    Moss Berry, MD  Called her to go over labs- all thyroid labs look ok.  Her BUN and creat are up a bit, as is her calcium  ?dehydration related.  She will RTC for a BMP in the next week or so.  She is feeling much better today.

## 2011-09-19 NOTE — Addendum Note (Signed)
Addended by: Lamar Blinks C on: 09/19/2011 02:40 PM   Modules accepted: Orders

## 2011-10-10 ENCOUNTER — Telehealth: Payer: Self-pay

## 2011-10-10 NOTE — Telephone Encounter (Signed)
The patient called regarding some questions she has about her blood pressure medicine.  Please call the patient at [phone].

## 2011-10-11 NOTE — Telephone Encounter (Signed)
LMOM to CB. 

## 2011-10-13 NOTE — Telephone Encounter (Signed)
Pt reports that her bisoprolol had been switched to Carvedilol and her BP was running higher on this. At last OV Dr Lorelei Pont had wanted her to cont on the carvedilol for a while, but pt wanted her to know that she DCd the carvedilol and restarted bisoprolol on 10/10/11 because her BP was still running in the 150s/high 90s and she was having HAs for several days. She has not taken it yet today, but yesterday after changing back, it was 140/high 80s. Pt reports she is still taking the amlodipine, chlorthalidone and lisinopril as instructed. Pt reports that she did have her RFTs rechecked at [facility] on 8/15 and Creatine was 1.14 and BUN was 24. Pt wanted to check w/Dr Copland to make sure it was OK for her to be back on the bisoprolol and to see if she wanted to adjust any other medications due to her kidney function. Pt is aware that Dr Lorelei Pont will be back in office tomorrow and we will be back in touch w/instr's after her review.

## 2011-10-15 NOTE — Telephone Encounter (Signed)
Called her back- was a bit confused as we had actually not adjusted her BP medications- this was done by her PCP DR. Parks.  However, if the new combination was not controlling he BP it seems reasonable to go back to her old medication regimen.

## 2012-03-18 ENCOUNTER — Other Ambulatory Visit: Payer: Self-pay | Admitting: Nephrology

## 2012-03-18 DIAGNOSIS — N179 Acute kidney failure, unspecified: Secondary | ICD-10-CM

## 2012-03-22 ENCOUNTER — Ambulatory Visit
Admission: RE | Admit: 2012-03-22 | Discharge: 2012-03-22 | Disposition: A | Discharge status: Home / Self Care | Payer: Medicare PPO | Source: Ambulatory Visit | Attending: Nephrology | Admitting: Nephrology

## 2012-03-22 DIAGNOSIS — N179 Acute kidney failure, unspecified: Secondary | ICD-10-CM

## 2012-09-25 ENCOUNTER — Encounter: Payer: Self-pay | Admitting: Family Medicine

## 2012-09-30 ENCOUNTER — Ambulatory Visit (INDEPENDENT_AMBULATORY_CARE_PROVIDER_SITE_OTHER): Payer: Medicare PPO | Admitting: Family Medicine

## 2012-09-30 VITALS — BP 140/90 | HR 95 | Temp 98.4°F | Resp 16 | Ht 62.5 in | Wt 186.6 lb

## 2012-09-30 DIAGNOSIS — E119 Type 2 diabetes mellitus without complications: Secondary | ICD-10-CM

## 2012-09-30 DIAGNOSIS — L500 Allergic urticaria: Secondary | ICD-10-CM

## 2012-09-30 DIAGNOSIS — T7840XA Allergy, unspecified, initial encounter: Secondary | ICD-10-CM

## 2012-09-30 LAB — POCT GLYCOSYLATED HEMOGLOBIN (HGB A1C): Hemoglobin A1C: 6.3

## 2012-09-30 LAB — GLUCOSE, POCT (MANUAL RESULT ENTRY): POC Glucose: 301 mg/dl — AB (ref 70–99)

## 2012-09-30 MED ORDER — EPINEPHRINE 0.3 MG/0.3ML IJ SOAJ: 0.3000 mg | Freq: Once | INTRAMUSCULAR | Status: DC

## 2012-09-30 MED ORDER — METHYLPREDNISOLONE ACETATE 80 MG/ML IJ SUSP
80.0000 mg | Freq: Once | INTRAMUSCULAR | Status: AC
  Administered 2012-09-30: 80 mg via INTRAMUSCULAR

## 2012-09-30 MED ORDER — DIPHENHYDRAMINE HCL 50 MG/ML IJ SOLN
50.0000 mg | Freq: Once | INTRAMUSCULAR | Status: AC
  Administered 2012-09-30: 50 mg via INTRAMUSCULAR

## 2012-09-30 NOTE — Progress Notes (Signed)
[facility]:  Office Visit  Chief Complaint:  Chief Complaint  Patient presents with  . Rash    rash on buttocks and now going down legs since yesterday    HPI: Jodi Bell is a 52 y.o. female who complains of  Rash on her behinds and thought something bit her and now moving down her leg.   She has some on her left arm. THere are more areas affected and she is itching all over. Tried Benadryl at home without releif. She has immuno deficiency Psoriatic Arthritis and is on steroid snad took 2 prednisone 5 mg and thought that would help but it did not. She has not tried any new meds. She was on Lisinopril and taken off and thought that would help but it did not.  She is not SOB, no CP, no voice changes. She has pecan, spicy mustard allergy  She saw an allergist a year ago and she is allergic to everything She has allergies and ususally takes Benadryl  She sees KIm Ernestina Patches for diabetes her HbA1c in March as 6.9   Past Medical History  Diagnosis Date  . Essential hypertension   . Eczema   . Asthma   . Diabetes mellitus   . Chronic insomnia   . Arthritis   . Candida infection   . GERD (gastroesophageal reflux disease)    Past Surgical History  Procedure Laterality Date  . Cesarean section    . Tubal ligation     History   Social History  . Marital Status: Divorced    Spouse Name: N/A    Number of Children: N/A  . Years of Education: N/A   Social History Main Topics  . Smoking status: Former Smoker    Quit date: 07/19/2011  . Smokeless tobacco: Not on file  . Alcohol Use: Yes  . Drug Use: No  . Sexual Activity: Not on file   Other Topics Concern  . Not on file   Social History Narrative  . No narrative on file   Family History  Problem Relation Age of Onset  . Lung cancer Mother   . Diabetes Sister   . Heart disease Sister   . Thyroid cancer      1/2 sister  . Heart disease      1/2 sister   No Active Allergies Prior to Admission  medications   Medication Sig Start Date End Date Taking? Authorizing Provider  albuterol (PROVENTIL HFA;VENTOLIN HFA) 108 (90 BASE) MCG/ACT inhaler Inhale 2 puffs into the lungs every 6 (six) hours as needed. For shortness of breath.   Yes Historical Provider, MD  albuterol (PROVENTIL) (2.5 MG/3ML) 0.083% nebulizer solution Take 2.5 mg by nebulization every 6 (six) hours as needed. For shortness of breath.   Yes Historical Provider, MD  allopurinol (ZYLOPRIM) 100 MG tablet Take 100 mg by mouth daily.   Yes Historical Provider, MD  amLODipine (NORVASC) 10 MG tablet Take 10 mg by mouth daily.   Yes Historical Provider, MD  aspirin EC 81 MG tablet Take 81 mg by mouth daily.   Yes Historical Provider, MD  bisoprolol (ZEBETA) 10 MG tablet Take 20 mg by mouth daily.   Yes Historical Provider, MD  chlorthalidone (HYGROTON) 25 MG tablet Take 25 mg by mouth daily.   Yes Historical Provider, MD  gabapentin (NEURONTIN) 100 MG capsule Take 100-200 mg by mouth 3 (three) times daily. Take 100 MG twice daily, and 200 MG at bedtime.   Yes  Historical Provider, MD  glyBURIDE (DIABETA) 2.5 MG tablet Take 2.5-7.5 mg by mouth 2 (two) times daily with a meal. Take 7.5 MG in the morning and 2.5 MG at night.   Yes Historical Provider, MD  lisinopril (PRINIVIL,ZESTRIL) 20 MG tablet Take 20 mg by mouth daily.   Yes Historical Provider, MD  metFORMIN (GLUCOPHAGE) 1000 MG tablet Take 1,000 mg by mouth 2 (two) times daily with a meal.   Yes Historical Provider, MD  methotrexate 25 MG/ML SOLN Inject 15 mg into the vein every 7 (seven) days. Inject on Saturdays. 15 MG = 0.6 mL   Yes Historical Provider, MD  montelukast (SINGULAIR) 10 MG tablet Take 10 mg by mouth at bedtime.   Yes Historical Provider, MD  oxyCODONE (ROXICODONE) 15 MG immediate release tablet Take 15 mg by mouth 2 (two) times daily as needed. For pain.   Yes Historical Provider, MD  pantoprazole (PROTONIX) 40 MG tablet Take 40 mg by mouth daily.   Yes Historical  Provider, MD  predniSONE (DELTASONE) 5 MG tablet Take 7.5 mg by mouth daily.   Yes Historical Provider, MD  varenicline (CHANTIX) 1 MG tablet Take 1 mg by mouth 2 (two) times daily.   Yes Historical Provider, MD  zolpidem (AMBIEN) 10 MG tablet Take 10 mg by mouth at bedtime as needed. For sleep.   Yes Historical Provider, MD  ALPRAZolam Duanne Moron) 0.25 MG tablet Take one or two every 8 hours as needed for anxiety 09/18/11   Gay Filler Copland, MD     ROS: The patient denies fevers, chills, night sweats, unintentional weight loss, chest pain, palpitations, wheezing, dyspnea on exertion, nausea, vomiting, abdominal pain, dysuria, hematuria, melena, numbness, weakness, or tingling.   All other systems have been reviewed and were otherwise negative with the exception of those mentioned in the HPI and as above.    PHYSICAL EXAM: Filed Vitals:   09/30/12 1807  BP: 140/90  Pulse: 95  Temp: 98.4 F (36.9 C)  Resp: 16   Filed Vitals:   09/30/12 1807  Height: 5' 2.5" (1.588 m)  Weight: 186 lb 9.6 oz (84.641 kg)   Body mass index is 33.56 kg/(m^2).  General: Alert, no acute distress HEENT:  Normocephalic, atraumatic, oropharynx patent. EOMI, PERRLA. Throat is open, no wheezing Cardiovascular:  Regular rate and rhythm, no rubs murmurs or gallops.  No Carotid bruits, radial pulse intact. No pedal edema.  Respiratory: Clear to auscultation bilaterally.  No wheezes, rales, or rhonchi.  No cyanosis, no use of accessory musculature GI: No organomegaly, abdomen is soft and non-tender, positive bowel sounds.  No masses. Skin: + urticarial rash on buttocks, on left arm.  Neurologic: Facial musculature symmetric. Psychiatric: Patient is appropriate throughout our interaction. Lymphatic: No cervical lymphadenopathy Musculoskeletal: Gait intact.   LABS: Results for orders placed in visit on 09/30/12  GLUCOSE, POCT (MANUAL RESULT ENTRY)      Result Value Range   POC Glucose 301 (*) 70 - 99 mg/dl  POCT  GLYCOSYLATED HEMOGLOBIN (HGB A1C)      Result Value Range   Hemoglobin A1C 6.3       EKG/XRAY:   Primary read interpreted by Dr. Marin Comment at [facility].   ASSESSMENT/PLAN: Encounter Diagnoses  Name Primary?  . Allergic reaction, initial encounter Yes  . Allergic urticaria   . Diabetes    No airway obstruction Benadryl 50 mg IM x 1 in office Depomedrol 80 mg IM x 1 Rx Epi pen Benadryl 50 mg scheduled until feesl better  F/u prn or go to ER prn F/u  With allergist Gross sideeffects, risk and benefits, and alternatives of medications d/w patient. Patient is aware that all medications have potential sideeffects and we are unable to predict every sideeffect or drug-drug interaction that may occur.  LE, [place], DO 09/30/2012 6:59 PM

## 2012-09-30 NOTE — Patient Instructions (Signed)
Hives Hives are itchy, red, swollen areas of the skin. They can vary in size and location on your body. Hives can come and go for hours or several days (acute hives) or for several weeks (chronic hives). Hives do not spread from person to person (noncontagious). They may get worse with scratching, exercise, and emotional stress. CAUSES   Allergic reaction to food, additives, or drugs.  Infections, including the common cold.  Illness, such as vasculitis, lupus, or thyroid disease.  Exposure to sunlight, heat, or cold.  Exercise.  Stress.  Contact with chemicals. SYMPTOMS   Red or white swollen patches on the skin. The patches may change size, shape, and location quickly and repeatedly.  Itching.  Swelling of the hands, feet, and face. This may occur if hives develop deeper in the skin. DIAGNOSIS  Your caregiver can usually tell what is wrong by performing a physical exam. Skin or blood tests may also be done to determine the cause of your hives. In some cases, the cause cannot be determined. TREATMENT  Mild cases usually get better with medicines such as antihistamines. Severe cases may require an emergency epinephrine injection. If the cause of your hives is known, treatment includes avoiding that trigger.  HOME CARE INSTRUCTIONS   Avoid causes that trigger your hives.  Take antihistamines as directed by your caregiver to reduce the severity of your hives. Non-sedating or low-sedating antihistamines are usually recommended. Do not drive while taking an antihistamine.  Take any other medicines prescribed for itching as directed by your caregiver.  Wear loose-fitting clothing.  Keep all follow-up appointments as directed by your caregiver. SEEK MEDICAL CARE IF:   You have persistent or severe itching that is not relieved with medicine.  You have painful or swollen joints. SEEK IMMEDIATE MEDICAL CARE IF:   You have a fever.  Your tongue or lips are swollen.  You have  trouble breathing or swallowing.  You feel tightness in the throat or chest.  You have abdominal pain. These problems may be the first sign of a life-threatening allergic reaction. Call your local emergency services (911 in [place] MAKE SURE YOU:   Understand these instructions.  Will watch your condition.  Will get help right away if you are not doing well or get worse. Document Released: 01/27/2005 Document Revised: 07/29/2011 Document Reviewed: 04/22/2011 [employer] Patient Information 2014 [employer].  

## 2012-10-12 ENCOUNTER — Ambulatory Visit (INDEPENDENT_AMBULATORY_CARE_PROVIDER_SITE_OTHER): Payer: Medicare PPO | Admitting: Internal Medicine

## 2012-10-12 VITALS — BP 118/68 | HR 91 | Temp 99.3°F | Resp 16 | Ht 62.5 in | Wt 184.0 lb

## 2012-10-12 DIAGNOSIS — R059 Cough, unspecified: Secondary | ICD-10-CM

## 2012-10-12 DIAGNOSIS — R739 Hyperglycemia, unspecified: Secondary | ICD-10-CM

## 2012-10-12 DIAGNOSIS — R7309 Other abnormal glucose: Secondary | ICD-10-CM

## 2012-10-12 DIAGNOSIS — K219 Gastro-esophageal reflux disease without esophagitis: Secondary | ICD-10-CM

## 2012-10-12 DIAGNOSIS — J029 Acute pharyngitis, unspecified: Secondary | ICD-10-CM

## 2012-10-12 DIAGNOSIS — E119 Type 2 diabetes mellitus without complications: Secondary | ICD-10-CM

## 2012-10-12 DIAGNOSIS — R509 Fever, unspecified: Secondary | ICD-10-CM

## 2012-10-12 DIAGNOSIS — R05 Cough: Secondary | ICD-10-CM

## 2012-10-12 DIAGNOSIS — J039 Acute tonsillitis, unspecified: Secondary | ICD-10-CM

## 2012-10-12 LAB — POCT CBC
Granulocyte percent: 83.3 % — AB (ref 37–80)
HCT, POC: 33.8 % — AB (ref 37.7–47.9)
Hemoglobin: 10.7 g/dL — AB (ref 12.2–16.2)
Lymph, poc: 1.4 (ref 0.6–3.4)
MCH, POC: 29.9 pg (ref 27–31.2)
MCHC: 31.7 g/dL — AB (ref 31.8–35.4)
MCV: 94.5 fL (ref 80–97)
MID (cbc): 0.8 (ref 0–0.9)
MPV: 8.4 fL (ref 0–99.8)
POC Granulocyte: 11 — AB (ref 2–6.9)
POC LYMPH PERCENT: 10.9 % (ref 10–50)
POC MID %: 5.8 % (ref 0–12)
Platelet Count, POC: 324 10*3/uL (ref 142–424)
RBC: 3.58 M/uL — AB (ref 4.04–5.48)
RDW, POC: 19 %
WBC: 13.2 10*3/uL — AB (ref 4.6–10.2)

## 2012-10-12 LAB — GLUCOSE, POCT (MANUAL RESULT ENTRY): POC Glucose: 124 mg/dL — AB (ref 70–99)

## 2012-10-12 LAB — POCT RAPID STREP A (OFFICE): Rapid Strep A Screen: NEGATIVE

## 2012-10-12 MED ORDER — ESOMEPRAZOLE MAGNESIUM 40 MG PO CPDR: 40.0000 mg | DELAYED_RELEASE_CAPSULE | Freq: Every day | ORAL | Status: DC

## 2012-10-12 MED ORDER — HYDROCODONE-ACETAMINOPHEN 7.5-325 MG/15ML PO SOLN: 5.0000 mL | Freq: Four times a day (QID) | ORAL | Status: DC | PRN

## 2012-10-12 MED ORDER — AZITHROMYCIN 500 MG PO TABS: 500.0000 mg | ORAL_TABLET | Freq: Every day | ORAL | Status: DC

## 2012-10-12 NOTE — Patient Instructions (Signed)
Tonsillitis Tonsils are lumps of lymphoid tissues at the back of the throat. Each tonsil has 20 crevices (crypts). Tonsils help fight nose and throat infections and keep infection from spreading to other parts of the body for the first 18 months of life. Tonsillitis is an infection of the throat that causes the tonsils to become red, tender, and swollen. CAUSES Sudden and, if treated, temporary (acute) tonsillitis is usually caused by infection with streptococcal bacteria. Long lasting (chronic) tonsillitis occurs when the crypts of the tonsils become filled with pieces of food and bacteria, which makes it easy for the tonsils to become constantly infected. SYMPTOMS  Symptoms of tonsillitis include:  A sore throat.  White patches on the tonsils.  Fever.  Tiredness. DIAGNOSIS Tonsillitis can be diagnosed through a physical exam. Diagnosis can be confirmed with the results of lab tests, including a throat culture. TREATMENT  The goals of tonsillitis treatment include the reduction of the severity and duration of symptoms, prevention of associated conditions, and prevention of disease transmission. Tonsillitis caused by bacteria can be treated with antibiotics. Usually, treatment with antibiotics is started before the cause of the tonsillitis is known. However, if it is determined that the cause is not bacterial, antibiotics will not treat the tonsillitis. If attacks of tonsillitis are severe and frequent, your caregiver may recommend surgery to remove the tonsils (tonsillectomy). HOME CARE INSTRUCTIONS   Rest as much as possible and get plenty of sleep.  Drink plenty of fluids. While the throat is very sore, eat soft foods or liquids, such as sherbet, soups, or instant breakfast drinks.  Eat frozen ice pops.  Older children and adults may gargle with a warm or cold liquid to help soothe the throat. Mix 1 teaspoon of salt in 1 cup of water.  Other family members who also develop a sore  throat or fever should have a medical exam or throat culture.  Only take over-the-counter or prescription medicines for pain, discomfort, or fever as directed by your caregiver.  If you are given antibiotics, take them as directed. Finish them even if you start to feel better. SEEK MEDICAL CARE IF:   Your baby is older than 3 months with a rectal temperature of 100.5 F (38.1 C) or higher for more than 1 day.  Large, tender lumps develop in your neck.  A rash develops.  Green, yellow-brown, or bloody substance is coughed up.  You are unable to swallow liquids or food for 24 hours.  Your child is unable to swallow food or liquids for 12 hours. SEEK IMMEDIATE MEDICAL CARE IF:   You develop any new symptoms such as vomiting, severe headache, stiff neck, chest pain, or trouble breathing or swallowing.  You have severe throat pain along with drooling or voice changes.  You have severe pain, unrelieved with recommended medications.  You are unable to fully open the mouth.  You develop redness, swelling, or severe pain anywhere in the neck.  You have a fever.  Your baby is older than 3 months with a rectal temperature of 102 F (38.9 C) or higher.  Your baby is 46 months old or younger with a rectal temperature of 100.4 F (38 C) or higher. MAKE SURE YOU:   Understand these instructions.  Will watch your condition.  Will get help right away if you are not doing well or get worse. Document Released: 11/06/2004 Document Revised: 04/21/2011 Document Reviewed: 04/04/2010 [facility] Patient Information 2014 [place], [place]. Diet for Gastroesophageal Reflux Disease, Adult Reflux (  acid reflux) is when acid from your stomach flows up into the esophagus. When acid comes in contact with the esophagus, the acid causes irritation and soreness (inflammation) in the esophagus. When reflux happens often or so severely that it causes damage to the esophagus, it is called gastroesophageal reflux  disease (GERD). Nutrition therapy can help ease the discomfort of GERD. FOODS OR DRINKS TO AVOID OR LIMIT  Smoking or chewing tobacco. Nicotine is one of the most potent stimulants to acid production in the gastrointestinal tract.  Caffeinated and decaffeinated coffee and black tea.  Regular or low-calorie carbonated beverages or energy drinks (caffeine-free carbonated beverages are allowed).   Strong spices, such as black pepper, white pepper, red pepper, cayenne, curry powder, and chili powder.  Peppermint or spearmint.  Chocolate.  High-fat foods, including meats and fried foods. Extra added fats including oils, butter, salad dressings, and nuts. Limit these to less than 8 tsp per day.  Fruits and vegetables if they are not tolerated, such as citrus fruits or tomatoes.  Alcohol.  Any food that seems to aggravate your condition. If you have questions regarding your diet, call your caregiver or a registered dietitian. OTHER THINGS THAT MAY HELP GERD INCLUDE:   Eating your meals slowly, in a relaxed setting.  Eating 5 to 6 small meals per day instead of 3 large meals.  Eliminating food for a period of time if it causes distress.  Not lying down until 3 hours after eating a meal.  Keeping the head of your bed raised 6 to 9 inches (15 to 23 cm) by using a foam wedge or blocks under the legs of the bed. Lying flat may make symptoms worse.  Being physically active. Weight loss may be helpful in reducing reflux in overweight or obese adults.  Wear loose fitting clothing EXAMPLE MEAL PLAN This meal plan is approximately 2,000 calories based on [email or web address] meal planning guidelines. Breakfast   cup cooked oatmeal.  1 cup strawberries.  1 cup low-fat milk.  1 oz almonds. Snack  1 cup cucumber slices.  6 oz yogurt (made from low-fat or fat-free milk). Lunch  2 slice whole-wheat bread.  2 oz sliced [place].  2 tsp mayonnaise.  1 cup blueberries.  1 cup  snap peas. Snack  6 whole-wheat crackers.  1 oz string cheese. Dinner   cup brown rice.  1 cup mixed veggies.  1 tsp olive oil.  3 oz grilled fish. Document Released: 01/27/2005 Document Revised: 04/21/2011 Document Reviewed: 12/13/2010 [facility] Patient Information 2014 [place], [place]. Gastroesophageal Reflux Disease, Adult Gastroesophageal reflux disease (GERD) happens when acid from your stomach flows up into the esophagus. When acid comes in contact with the esophagus, the acid causes soreness (inflammation) in the esophagus. Over time, GERD may create small holes (ulcers) in the lining of the esophagus. CAUSES   Increased body weight. This puts pressure on the stomach, making acid rise from the stomach into the esophagus.  Smoking. This increases acid production in the stomach.  Drinking alcohol. This causes decreased pressure in the lower esophageal sphincter (valve or ring of muscle between the esophagus and stomach), allowing acid from the stomach into the esophagus.  Late evening meals and a full stomach. This increases pressure and acid production in the stomach.  A malformed lower esophageal sphincter. Sometimes, no cause is found. SYMPTOMS   Burning pain in the lower part of the mid-chest behind the breastbone and in the mid-stomach area. This may occur twice a week or more  often.  Trouble swallowing.  Sore throat.  Dry cough.  Asthma-like symptoms including chest tightness, shortness of breath, or wheezing. DIAGNOSIS  Your caregiver may be able to diagnose GERD based on your symptoms. In some cases, X-rays and other tests may be done to check for complications or to check the condition of your stomach and esophagus. TREATMENT  Your caregiver may recommend over-the-counter or prescription medicines to help decrease acid production. Ask your caregiver before starting or adding any new medicines.  HOME CARE INSTRUCTIONS   Change the factors that you can  control. Ask your caregiver for guidance concerning weight loss, quitting smoking, and alcohol consumption.  Avoid foods and drinks that make your symptoms worse, such as:  Caffeine or alcoholic drinks.  Chocolate.  Peppermint or mint flavorings.  Garlic and onions.  Spicy foods.  Citrus fruits, such as oranges, lemons, or limes.  Tomato-based foods such as sauce, chili, salsa, and pizza.  Fried and fatty foods.  Avoid lying down for the 3 hours prior to your bedtime or prior to taking a nap.  Eat small, frequent meals instead of large meals.  Wear loose-fitting clothing. Do not wear anything tight around your waist that causes pressure on your stomach.  Raise the head of your bed 6 to 8 inches with wood blocks to help you sleep. Extra pillows will not help.  Only take over-the-counter or prescription medicines for pain, discomfort, or fever as directed by your caregiver.  Do not take aspirin, ibuprofen, or other nonsteroidal anti-inflammatory drugs (NSAIDs). SEEK IMMEDIATE MEDICAL CARE IF:   You have pain in your arms, neck, jaw, teeth, or back.  Your pain increases or changes in intensity or duration.  You develop nausea, vomiting, or sweating (diaphoresis).  You develop shortness of breath, or you faint.  Your vomit is green, yellow, black, or looks like coffee grounds or blood.  Your stool is red, bloody, or black. These symptoms could be signs of other problems, such as heart disease, gastric bleeding, or esophageal bleeding. MAKE SURE YOU:   Understand these instructions.  Will watch your condition.  Will get help right away if you are not doing well or get worse. Document Released: 11/06/2004 Document Revised: 04/21/2011 Document Reviewed: 08/16/2010 [facility] Patient Information 2014 [place], [place].

## 2012-10-12 NOTE — Progress Notes (Signed)
  Subjective:    Patient ID: Jodi Bell, female    DOB: Oct 03, 1960, 52 y.o.   MRN: [id number]  HPI 2-3 days of fever, very st, early dry cough. Has DM, psoriatic arthritis on immune modulators, last glucose 310 but A1C 6.3. No sob, cp. Is wheezing a little but has neb at home.   Review of Systems     Objective:   Physical Exam  Constitutional: She is oriented to person, place, and time. She appears well-developed and well-nourished. No distress.  HENT:  Right Ear: External ear normal.  Left Ear: External ear normal.  Mouth/Throat: Oropharyngeal exudate present.  Eyes: EOM are normal.  Neck: Normal range of motion. Neck supple.  Cardiovascular: Normal rate.   Pulmonary/Chest: Effort normal. She has wheezes.  Musculoskeletal: She exhibits edema and tenderness.  Neurological: She is alert and oriented to person, place, and time. She exhibits normal muscle tone. Coordination normal.  Skin: No rash noted.  Psychiatric: She has a normal mood and affect.   Results for orders placed in visit on 10/12/12  POCT CBC      Result Value Range   WBC 13.2 (*) 4.6 - 10.2 K/uL   Lymph, poc 1.4  0.6 - 3.4   POC LYMPH PERCENT 10.9  10 - 50 %L   MID (cbc) 0.8  0 - 0.9   POC MID % 5.8  0 - 12 %M   POC Granulocyte 11.0 (*) 2 - 6.9   Granulocyte percent 83.3 (*) 37 - 80 %G   RBC 3.58 (*) 4.04 - 5.48 M/uL   Hemoglobin 10.7 (*) 12.2 - 16.2 g/dL   HCT, POC 33.8 (*) 37.7 - 47.9 %   MCV 94.5  80 - 97 fL   MCH, POC 29.9  27 - 31.2 pg   MCHC 31.7 (*) 31.8 - 35.4 g/dL   RDW, POC 19.0     Platelet Count, POC 324  142 - 424 K/uL   MPV 8.4  0 - 99.8 fL  GLUCOSE, POCT (MANUAL RESULT ENTRY)      Result Value Range   POC Glucose 124 (*) 70 - 99 mg/dl  POCT RAPID STREP A (OFFICE)      Result Value Range   Rapid Strep A Screen Negative  Negative          Assessment & Plan:  Tonsillitis Cough/Gerd

## 2013-09-27 ENCOUNTER — Inpatient Hospital Stay (HOSPITAL_BASED_OUTPATIENT_CLINIC_OR_DEPARTMENT_OTHER)
Admission: EM | Admit: 2013-09-27 | Discharge: 2013-10-03 | DRG: 193 | Disposition: A | Discharge status: Home / Self Care | Payer: Medicare PPO | Attending: Internal Medicine | Admitting: Internal Medicine

## 2013-09-27 ENCOUNTER — Encounter (HOSPITAL_BASED_OUTPATIENT_CLINIC_OR_DEPARTMENT_OTHER): Payer: Self-pay | Admitting: Emergency Medicine

## 2013-09-27 ENCOUNTER — Emergency Department (HOSPITAL_BASED_OUTPATIENT_CLINIC_OR_DEPARTMENT_OTHER): Payer: Medicare PPO

## 2013-09-27 DIAGNOSIS — J189 Pneumonia, unspecified organism: Secondary | ICD-10-CM | POA: Diagnosis not present

## 2013-09-27 DIAGNOSIS — G2579 Other drug induced movement disorders: Secondary | ICD-10-CM | POA: Diagnosis present

## 2013-09-27 DIAGNOSIS — J9690 Respiratory failure, unspecified, unspecified whether with hypoxia or hypercapnia: Secondary | ICD-10-CM

## 2013-09-27 DIAGNOSIS — E876 Hypokalemia: Secondary | ICD-10-CM | POA: Diagnosis not present

## 2013-09-27 DIAGNOSIS — K219 Gastro-esophageal reflux disease without esophagitis: Secondary | ICD-10-CM | POA: Diagnosis present

## 2013-09-27 DIAGNOSIS — E872 Acidosis, unspecified: Secondary | ICD-10-CM | POA: Diagnosis present

## 2013-09-27 DIAGNOSIS — D649 Anemia, unspecified: Secondary | ICD-10-CM | POA: Diagnosis present

## 2013-09-27 DIAGNOSIS — E2749 Other adrenocortical insufficiency: Secondary | ICD-10-CM | POA: Diagnosis present

## 2013-09-27 DIAGNOSIS — E1169 Type 2 diabetes mellitus with other specified complication: Secondary | ICD-10-CM | POA: Diagnosis present

## 2013-09-27 DIAGNOSIS — J45901 Unspecified asthma with (acute) exacerbation: Secondary | ICD-10-CM

## 2013-09-27 DIAGNOSIS — E2740 Unspecified adrenocortical insufficiency: Secondary | ICD-10-CM

## 2013-09-27 DIAGNOSIS — E871 Hypo-osmolality and hyponatremia: Secondary | ICD-10-CM | POA: Diagnosis not present

## 2013-09-27 DIAGNOSIS — D899 Disorder involving the immune mechanism, unspecified: Secondary | ICD-10-CM | POA: Diagnosis present

## 2013-09-27 DIAGNOSIS — J9601 Acute respiratory failure with hypoxia: Secondary | ICD-10-CM | POA: Diagnosis present

## 2013-09-27 DIAGNOSIS — F17200 Nicotine dependence, unspecified, uncomplicated: Secondary | ICD-10-CM | POA: Diagnosis present

## 2013-09-27 DIAGNOSIS — Z79899 Other long term (current) drug therapy: Secondary | ICD-10-CM

## 2013-09-27 DIAGNOSIS — J9600 Acute respiratory failure, unspecified whether with hypoxia or hypercapnia: Secondary | ICD-10-CM | POA: Diagnosis present

## 2013-09-27 DIAGNOSIS — Z833 Family history of diabetes mellitus: Secondary | ICD-10-CM

## 2013-09-27 DIAGNOSIS — J9602 Acute respiratory failure with hypercapnia: Secondary | ICD-10-CM | POA: Diagnosis present

## 2013-09-27 DIAGNOSIS — G9340 Encephalopathy, unspecified: Secondary | ICD-10-CM | POA: Diagnosis present

## 2013-09-27 DIAGNOSIS — D538 Other specified nutritional anemias: Secondary | ICD-10-CM

## 2013-09-27 DIAGNOSIS — N178 Other acute kidney failure: Secondary | ICD-10-CM

## 2013-09-27 DIAGNOSIS — T380X5A Adverse effect of glucocorticoids and synthetic analogues, initial encounter: Secondary | ICD-10-CM | POA: Diagnosis present

## 2013-09-27 DIAGNOSIS — J45909 Unspecified asthma, uncomplicated: Secondary | ICD-10-CM

## 2013-09-27 DIAGNOSIS — G4700 Insomnia, unspecified: Secondary | ICD-10-CM

## 2013-09-27 DIAGNOSIS — Z801 Family history of malignant neoplasm of trachea, bronchus and lung: Secondary | ICD-10-CM

## 2013-09-27 DIAGNOSIS — Z7982 Long term (current) use of aspirin: Secondary | ICD-10-CM

## 2013-09-27 DIAGNOSIS — R0902 Hypoxemia: Secondary | ICD-10-CM

## 2013-09-27 DIAGNOSIS — R509 Fever, unspecified: Secondary | ICD-10-CM | POA: Diagnosis not present

## 2013-09-27 DIAGNOSIS — L259 Unspecified contact dermatitis, unspecified cause: Secondary | ICD-10-CM

## 2013-09-27 DIAGNOSIS — IMO0002 Reserved for concepts with insufficient information to code with codable children: Secondary | ICD-10-CM

## 2013-09-27 DIAGNOSIS — G2589 Other specified extrapyramidal and movement disorders: Secondary | ICD-10-CM | POA: Diagnosis present

## 2013-09-27 DIAGNOSIS — I10 Essential (primary) hypertension: Secondary | ICD-10-CM

## 2013-09-27 DIAGNOSIS — I129 Hypertensive chronic kidney disease with stage 1 through stage 4 chronic kidney disease, or unspecified chronic kidney disease: Secondary | ICD-10-CM | POA: Diagnosis present

## 2013-09-27 DIAGNOSIS — I959 Hypotension, unspecified: Secondary | ICD-10-CM | POA: Diagnosis present

## 2013-09-27 DIAGNOSIS — E662 Morbid (severe) obesity with alveolar hypoventilation: Secondary | ICD-10-CM | POA: Diagnosis present

## 2013-09-27 DIAGNOSIS — E119 Type 2 diabetes mellitus without complications: Secondary | ICD-10-CM | POA: Diagnosis present

## 2013-09-27 DIAGNOSIS — T43205A Adverse effect of unspecified antidepressants, initial encounter: Secondary | ICD-10-CM | POA: Diagnosis present

## 2013-09-27 DIAGNOSIS — G9081 Serotonin syndrome: Secondary | ICD-10-CM | POA: Diagnosis present

## 2013-09-27 DIAGNOSIS — N189 Chronic kidney disease, unspecified: Secondary | ICD-10-CM | POA: Diagnosis present

## 2013-09-27 DIAGNOSIS — L4050 Arthropathic psoriasis, unspecified: Secondary | ICD-10-CM | POA: Diagnosis present

## 2013-09-27 NOTE — ED Notes (Signed)
Patient transported to X-ray 

## 2013-09-27 NOTE — ED Notes (Signed)
Pt c/o spasms in arms/hand, constipation, headache, fever (100.8 1 hour ago). Pt last took ASA,1 hour ago.

## 2013-09-28 ENCOUNTER — Inpatient Hospital Stay (HOSPITAL_COMMUNITY): Payer: Medicare PPO

## 2013-09-28 ENCOUNTER — Encounter (HOSPITAL_BASED_OUTPATIENT_CLINIC_OR_DEPARTMENT_OTHER): Payer: Self-pay | Admitting: Emergency Medicine

## 2013-09-28 DIAGNOSIS — J9602 Acute respiratory failure with hypercapnia: Secondary | ICD-10-CM | POA: Diagnosis present

## 2013-09-28 DIAGNOSIS — T380X5A Adverse effect of glucocorticoids and synthetic analogues, initial encounter: Secondary | ICD-10-CM | POA: Diagnosis present

## 2013-09-28 DIAGNOSIS — J9600 Acute respiratory failure, unspecified whether with hypoxia or hypercapnia: Secondary | ICD-10-CM | POA: Diagnosis present

## 2013-09-28 DIAGNOSIS — I129 Hypertensive chronic kidney disease with stage 1 through stage 4 chronic kidney disease, or unspecified chronic kidney disease: Secondary | ICD-10-CM | POA: Diagnosis present

## 2013-09-28 DIAGNOSIS — R0902 Hypoxemia: Secondary | ICD-10-CM

## 2013-09-28 DIAGNOSIS — E872 Acidosis, unspecified: Secondary | ICD-10-CM | POA: Diagnosis present

## 2013-09-28 DIAGNOSIS — Z801 Family history of malignant neoplasm of trachea, bronchus and lung: Secondary | ICD-10-CM | POA: Diagnosis not present

## 2013-09-28 DIAGNOSIS — J9601 Acute respiratory failure with hypoxia: Secondary | ICD-10-CM | POA: Diagnosis present

## 2013-09-28 DIAGNOSIS — G2579 Other drug induced movement disorders: Secondary | ICD-10-CM | POA: Diagnosis present

## 2013-09-28 DIAGNOSIS — E871 Hypo-osmolality and hyponatremia: Secondary | ICD-10-CM | POA: Diagnosis not present

## 2013-09-28 DIAGNOSIS — R609 Edema, unspecified: Secondary | ICD-10-CM

## 2013-09-28 DIAGNOSIS — E2749 Other adrenocortical insufficiency: Secondary | ICD-10-CM | POA: Diagnosis present

## 2013-09-28 DIAGNOSIS — E876 Hypokalemia: Secondary | ICD-10-CM | POA: Diagnosis not present

## 2013-09-28 DIAGNOSIS — R509 Fever, unspecified: Secondary | ICD-10-CM | POA: Diagnosis present

## 2013-09-28 DIAGNOSIS — Z79899 Other long term (current) drug therapy: Secondary | ICD-10-CM | POA: Diagnosis not present

## 2013-09-28 DIAGNOSIS — N178 Other acute kidney failure: Secondary | ICD-10-CM

## 2013-09-28 DIAGNOSIS — G2589 Other specified extrapyramidal and movement disorders: Secondary | ICD-10-CM

## 2013-09-28 DIAGNOSIS — G9340 Encephalopathy, unspecified: Secondary | ICD-10-CM | POA: Diagnosis present

## 2013-09-28 DIAGNOSIS — L4050 Arthropathic psoriasis, unspecified: Secondary | ICD-10-CM | POA: Diagnosis present

## 2013-09-28 DIAGNOSIS — E662 Morbid (severe) obesity with alveolar hypoventilation: Secondary | ICD-10-CM | POA: Diagnosis present

## 2013-09-28 DIAGNOSIS — I959 Hypotension, unspecified: Secondary | ICD-10-CM | POA: Diagnosis present

## 2013-09-28 DIAGNOSIS — J45909 Unspecified asthma, uncomplicated: Secondary | ICD-10-CM | POA: Diagnosis present

## 2013-09-28 DIAGNOSIS — J9690 Respiratory failure, unspecified, unspecified whether with hypoxia or hypercapnia: Secondary | ICD-10-CM

## 2013-09-28 DIAGNOSIS — J189 Pneumonia, unspecified organism: Secondary | ICD-10-CM | POA: Diagnosis present

## 2013-09-28 DIAGNOSIS — F17200 Nicotine dependence, unspecified, uncomplicated: Secondary | ICD-10-CM | POA: Diagnosis present

## 2013-09-28 DIAGNOSIS — L259 Unspecified contact dermatitis, unspecified cause: Secondary | ICD-10-CM | POA: Diagnosis present

## 2013-09-28 DIAGNOSIS — Z833 Family history of diabetes mellitus: Secondary | ICD-10-CM | POA: Diagnosis not present

## 2013-09-28 DIAGNOSIS — T43205A Adverse effect of unspecified antidepressants, initial encounter: Secondary | ICD-10-CM | POA: Diagnosis present

## 2013-09-28 DIAGNOSIS — D649 Anemia, unspecified: Secondary | ICD-10-CM | POA: Diagnosis present

## 2013-09-28 DIAGNOSIS — IMO0002 Reserved for concepts with insufficient information to code with codable children: Secondary | ICD-10-CM | POA: Diagnosis not present

## 2013-09-28 DIAGNOSIS — Z7982 Long term (current) use of aspirin: Secondary | ICD-10-CM | POA: Diagnosis not present

## 2013-09-28 DIAGNOSIS — N189 Chronic kidney disease, unspecified: Secondary | ICD-10-CM | POA: Diagnosis present

## 2013-09-28 DIAGNOSIS — E1169 Type 2 diabetes mellitus with other specified complication: Secondary | ICD-10-CM | POA: Diagnosis present

## 2013-09-28 DIAGNOSIS — K219 Gastro-esophageal reflux disease without esophagitis: Secondary | ICD-10-CM | POA: Diagnosis present

## 2013-09-28 DIAGNOSIS — D899 Disorder involving the immune mechanism, unspecified: Secondary | ICD-10-CM | POA: Diagnosis present

## 2013-09-28 LAB — BLOOD GAS, ARTERIAL
Acid-base deficit: 3.3 mmol/L — ABNORMAL HIGH (ref 0.0–2.0)
Acid-base deficit: 3.8 mmol/L — ABNORMAL HIGH (ref 0.0–2.0)
Bicarbonate: 22.5 mEq/L (ref 20.0–24.0)
Bicarbonate: 21.8 mEq/L (ref 20.0–24.0)
Delivery systems: POSITIVE
Drawn by: 28701
Drawn by: 277551
Expiratory PAP: 5
Expiratory PAP: 4
FIO2: 0.3 %
FIO2: 0.21 %
Inspiratory PAP: 16
Inspiratory PAP: 25
O2 Saturation: 97.7 %
O2 Saturation: 88.7 %
PEEP: 0 cmH2O
Patient temperature: 99.8
Patient temperature: 98.6
RATE: 10 resp/min
TCO2: 24 mmol/L (ref 0–100)
TCO2: 23.3 mmol/L (ref 0–100)
pCO2 arterial: 51.9 mmHg — ABNORMAL HIGH (ref 35.0–45.0)
pCO2 arterial: 47.8 mmHg — ABNORMAL HIGH (ref 35.0–45.0)
pH, Arterial: 7.264 — ABNORMAL LOW (ref 7.350–7.450)
pH, Arterial: 7.282 — ABNORMAL LOW (ref 7.350–7.450)
pO2, Arterial: 107 mmHg — ABNORMAL HIGH (ref 80.0–100.0)
pO2, Arterial: 61.5 mmHg — ABNORMAL LOW (ref 80.0–100.0)

## 2013-09-28 LAB — BASIC METABOLIC PANEL
Anion gap: 12 (ref 5–15)
Anion gap: 13 (ref 5–15)
BUN: 30 mg/dL — ABNORMAL HIGH (ref 6–23)
BUN: 28 mg/dL — ABNORMAL HIGH (ref 6–23)
CO2: 24 mEq/L (ref 19–32)
CO2: 19 mEq/L (ref 19–32)
Calcium: 10.1 mg/dL (ref 8.4–10.5)
Calcium: 9.2 mg/dL (ref 8.4–10.5)
Chloride: 96 mEq/L (ref 96–112)
Chloride: 99 mEq/L (ref 96–112)
Creatinine, Ser: 2.5 mg/dL — ABNORMAL HIGH (ref 0.50–1.10)
Creatinine, Ser: 2.13 mg/dL — ABNORMAL HIGH (ref 0.50–1.10)
GFR calc Af Amer: 24 mL/min — ABNORMAL LOW (ref >90)
GFR calc Af Amer: 29 mL/min — ABNORMAL LOW (ref >90)
GFR calc non Af Amer: 21 mL/min — ABNORMAL LOW (ref >90)
GFR calc non Af Amer: 25 mL/min — ABNORMAL LOW (ref >90)
Glucose, Bld: 114 mg/dL — ABNORMAL HIGH (ref 70–99)
Glucose, Bld: 121 mg/dL — ABNORMAL HIGH (ref 70–99)
Potassium: 4 mEq/L (ref 3.7–5.3)
Potassium: 4.3 mEq/L (ref 3.7–5.3)
Sodium: 132 mEq/L — ABNORMAL LOW (ref 137–147)
Sodium: 131 mEq/L — ABNORMAL LOW (ref 137–147)

## 2013-09-28 LAB — RAPID URINE DRUG SCREEN, HOSP PERFORMED
Amphetamines: NOT DETECTED
Barbiturates: NOT DETECTED
Benzodiazepines: NOT DETECTED
Cocaine: NOT DETECTED
Opiates: NOT DETECTED
Tetrahydrocannabinol: NOT DETECTED

## 2013-09-28 LAB — CBC
HCT: 22.5 % — ABNORMAL LOW (ref 36.0–46.0)
Hemoglobin: 7.1 g/dL — ABNORMAL LOW (ref 12.0–15.0)
MCH: 28.4 pg (ref 26.0–34.0)
MCHC: 31.6 g/dL (ref 30.0–36.0)
MCV: 90 fL (ref 78.0–100.0)
Platelets: 186 10*3/uL (ref 150–400)
RBC: 2.5 MIL/uL — ABNORMAL LOW (ref 3.87–5.11)
RDW: 21.6 % — ABNORMAL HIGH (ref 11.5–15.5)
WBC: 3.5 10*3/uL — ABNORMAL LOW (ref 4.0–10.5)

## 2013-09-28 LAB — I-STAT ARTERIAL BLOOD GAS, ED
Acid-base deficit: 4 mmol/L — ABNORMAL HIGH (ref 0.0–2.0)
Bicarbonate: 22.6 mEq/L (ref 20.0–24.0)
O2 Saturation: 94 %
Patient temperature: 100.1
TCO2: 24 mmol/L (ref 0–100)
pCO2 arterial: 50.6 mmHg — ABNORMAL HIGH (ref 35.0–45.0)
pH, Arterial: 7.263 — ABNORMAL LOW (ref 7.350–7.450)
pO2, Arterial: 85 mmHg (ref 80.0–100.0)

## 2013-09-28 LAB — CBC WITH DIFFERENTIAL/PLATELET
Basophils Absolute: 0 10*3/uL (ref 0.0–0.1)
Basophils Relative: 0 % (ref 0–1)
Eosinophils Absolute: 0.2 10*3/uL (ref 0.0–0.7)
Eosinophils Relative: 3 % (ref 0–5)
HCT: 23.9 % — ABNORMAL LOW (ref 36.0–46.0)
Hemoglobin: 7.4 g/dL — ABNORMAL LOW (ref 12.0–15.0)
Lymphocytes Relative: 23 % (ref 12–46)
Lymphs Abs: 1.3 10*3/uL (ref 0.7–4.0)
MCH: 27.7 pg (ref 26.0–34.0)
MCHC: 31 g/dL (ref 30.0–36.0)
MCV: 89.5 fL (ref 78.0–100.0)
Monocytes Absolute: 0.2 10*3/uL (ref 0.1–1.0)
Monocytes Relative: 3 % (ref 3–12)
Neutro Abs: 3.9 10*3/uL (ref 1.7–7.7)
Neutrophils Relative %: 70 % (ref 43–77)
Platelets: 235 10*3/uL (ref 150–400)
RBC: 2.67 MIL/uL — ABNORMAL LOW (ref 3.87–5.11)
RDW: 20.9 % — ABNORMAL HIGH (ref 11.5–15.5)
WBC: 5.5 10*3/uL (ref 4.0–10.5)

## 2013-09-28 LAB — URINALYSIS, ROUTINE W REFLEX MICROSCOPIC
Bilirubin Urine: NEGATIVE
Glucose, UA: NEGATIVE mg/dL
Hgb urine dipstick: NEGATIVE
Ketones, ur: NEGATIVE mg/dL
Leukocytes, UA: NEGATIVE
Nitrite: NEGATIVE
Protein, ur: 100 mg/dL — AB
Specific Gravity, Urine: 1.007 (ref 1.005–1.030)
Urobilinogen, UA: 0.2 mg/dL (ref 0.0–1.0)
pH: 5 (ref 5.0–8.0)

## 2013-09-28 LAB — OCCULT BLOOD X 1 CARD TO LAB, STOOL: Fecal Occult Bld: NEGATIVE

## 2013-09-28 LAB — URINE MICROSCOPIC-ADD ON

## 2013-09-28 LAB — GLUCOSE, CAPILLARY
Glucose-Capillary: 100 mg/dL — ABNORMAL HIGH (ref 70–99)
Glucose-Capillary: 125 mg/dL — ABNORMAL HIGH (ref 70–99)
Glucose-Capillary: 246 mg/dL — ABNORMAL HIGH (ref 70–99)

## 2013-09-28 LAB — I-STAT CG4 LACTIC ACID, ED: Lactic Acid, Venous: 0.77 mmol/L (ref 0.5–2.2)

## 2013-09-28 LAB — MRSA PCR SCREENING: MRSA by PCR: NEGATIVE

## 2013-09-28 LAB — LACTIC ACID, PLASMA: Lactic Acid, Venous: 0.5 mmol/L (ref 0.5–2.2)

## 2013-09-28 LAB — RAPID STREP SCREEN (MED CTR MEBANE ONLY): Streptococcus, Group A Screen (Direct): NEGATIVE

## 2013-09-28 LAB — ABO/RH: ABO/RH(D): AB POS

## 2013-09-28 LAB — TROPONIN I: Troponin I: <0.3 ng/mL (ref <0.30)

## 2013-09-28 LAB — PROCALCITONIN: Procalcitonin: <0.1 ng/mL

## 2013-09-28 MED ORDER — CHLORHEXIDINE GLUCONATE 0.12 % MT SOLN
15.0000 mL | Freq: Two times a day (BID) | OROMUCOSAL | Status: DC
  Administered 2013-09-28 – 2013-10-03 (×10): 15 mL via OROMUCOSAL
  Filled 2013-09-28 (×13): qty 15

## 2013-09-28 MED ORDER — CETYLPYRIDINIUM CHLORIDE 0.05 % MT LIQD
7.0000 mL | Freq: Two times a day (BID) | OROMUCOSAL | Status: DC
  Administered 2013-09-28 – 2013-10-03 (×10): 7 mL via OROMUCOSAL

## 2013-09-28 MED ORDER — KETOROLAC TROMETHAMINE 30 MG/ML IJ SOLN: 30.0000 mg | Freq: Once | INTRAMUSCULAR | Status: DC

## 2013-09-28 MED ORDER — DEXTROSE 5 % IV SOLN
1.0000 g | Freq: Once | INTRAVENOUS | Status: AC
  Administered 2013-09-28: 1 g via INTRAVENOUS

## 2013-09-28 MED ORDER — PIPERACILLIN-TAZOBACTAM 3.375 G IVPB
3.3750 g | Freq: Three times a day (TID) | INTRAVENOUS | Status: DC
  Administered 2013-09-28 – 2013-09-29 (×5): 3.375 g via INTRAVENOUS
  Filled 2013-09-28 (×7): qty 50

## 2013-09-28 MED ORDER — SODIUM CHLORIDE 0.9 % IV BOLUS (SEPSIS)
500.0000 mL | Freq: Once | INTRAVENOUS | Status: AC
  Administered 2013-09-28: 500 mL via INTRAVENOUS

## 2013-09-28 MED ORDER — DEXTROSE 5 % IV SOLN
500.0000 mg | Freq: Once | INTRAVENOUS | Status: AC
  Administered 2013-09-28: 500 mg via INTRAVENOUS
  Filled 2013-09-28: qty 500

## 2013-09-28 MED ORDER — SODIUM CHLORIDE 0.9 % IV SOLN
1500.0000 mg | INTRAVENOUS | Status: DC
  Administered 2013-09-28 – 2013-09-29 (×2): 1500 mg via INTRAVENOUS
  Filled 2013-09-28 (×3): qty 1500

## 2013-09-28 MED ORDER — ONDANSETRON HCL 4 MG PO TABS: 4.0000 mg | ORAL_TABLET | Freq: Four times a day (QID) | ORAL | Status: DC | PRN

## 2013-09-28 MED ORDER — MONTELUKAST SODIUM 10 MG PO TABS
10.0000 mg | ORAL_TABLET | Freq: Every day | ORAL | Status: DC
  Administered 2013-09-28 – 2013-10-02 (×5): 10 mg via ORAL
  Filled 2013-09-28 (×6): qty 1

## 2013-09-28 MED ORDER — SODIUM CHLORIDE 0.9 % IV BOLUS (SEPSIS)
1000.0000 mL | Freq: Once | INTRAVENOUS | Status: AC
  Administered 2013-09-28: 1000 mL via INTRAVENOUS

## 2013-09-28 MED ORDER — CHLORTHALIDONE 25 MG PO TABS
25.0000 mg | ORAL_TABLET | Freq: Every day | ORAL | Status: DC
  Filled 2013-09-28: qty 1

## 2013-09-28 MED ORDER — ACETAMINOPHEN 500 MG PO TABS
1000.0000 mg | ORAL_TABLET | Freq: Once | ORAL | Status: AC
  Administered 2013-09-28: 1000 mg via ORAL
  Filled 2013-09-28: qty 2

## 2013-09-28 MED ORDER — HYDROCORTISONE NA SUCCINATE PF 100 MG IJ SOLR
50.0000 mg | Freq: Four times a day (QID) | INTRAMUSCULAR | Status: DC
  Administered 2013-09-28 – 2013-09-30 (×8): 50 mg via INTRAVENOUS
  Filled 2013-09-28 (×11): qty 1

## 2013-09-28 MED ORDER — INSULIN ASPART 100 UNIT/ML ~~LOC~~ SOLN
0.0000 [IU] | Freq: Three times a day (TID) | SUBCUTANEOUS | Status: DC
Start: 2013-09-28 — End: 2013-10-01
  Administered 2013-09-29: 3 [IU] via SUBCUTANEOUS
  Administered 2013-09-29: 2 [IU] via SUBCUTANEOUS
  Administered 2013-09-29 – 2013-09-30 (×2): 3 [IU] via SUBCUTANEOUS
  Administered 2013-09-30: 2 [IU] via SUBCUTANEOUS
  Administered 2013-09-30: 3 [IU] via SUBCUTANEOUS

## 2013-09-28 MED ORDER — IPRATROPIUM-ALBUTEROL 0.5-2.5 (3) MG/3ML IN SOLN
3.0000 mL | RESPIRATORY_TRACT | Status: DC
  Administered 2013-09-28: 3 mL via RESPIRATORY_TRACT
  Filled 2013-09-28: qty 3

## 2013-09-28 MED ORDER — IPRATROPIUM-ALBUTEROL 0.5-2.5 (3) MG/3ML IN SOLN
3.0000 mL | Freq: Four times a day (QID) | RESPIRATORY_TRACT | Status: DC
  Administered 2013-09-28 – 2013-09-29 (×3): 3 mL via RESPIRATORY_TRACT
  Filled 2013-09-28 (×4): qty 3

## 2013-09-28 MED ORDER — CEFTRIAXONE SODIUM 1 G IJ SOLR
INTRAMUSCULAR | Status: AC
  Filled 2013-09-28: qty 10

## 2013-09-28 MED ORDER — LORAZEPAM 2 MG/ML IJ SOLN
1.0000 mg | Freq: Once | INTRAMUSCULAR | Status: AC
  Administered 2013-09-28: 1 mg via INTRAVENOUS
  Filled 2013-09-28: qty 1

## 2013-09-28 MED ORDER — ONDANSETRON HCL 4 MG/2ML IJ SOLN: 4.0000 mg | Freq: Four times a day (QID) | INTRAMUSCULAR | Status: DC | PRN

## 2013-09-28 MED ORDER — HYDROCORTISONE NA SUCCINATE PF 100 MG IJ SOLR
100.0000 mg | Freq: Four times a day (QID) | INTRAMUSCULAR | Status: DC
  Administered 2013-09-28: 100 mg via INTRAVENOUS
  Filled 2013-09-28 (×4): qty 2

## 2013-09-28 MED ORDER — SODIUM CHLORIDE 0.9 % IV SOLN
INTRAVENOUS | Status: DC
  Administered 2013-09-28 – 2013-10-01 (×4): via INTRAVENOUS

## 2013-09-28 NOTE — Progress Notes (Signed)
RT note- Dr. Chuck Hint here to see patient, cancelling ICU transfer, continue to monitor.

## 2013-09-28 NOTE — Consult Note (Signed)
PULMONARY / CRITICAL CARE MEDICINE   Name: Jodi Bell MRN: [id number] DOB: 12-02-1960    ADMISSION DATE:  09/27/2013 CONSULTATION DATE:  09/28/13  REFERRING MD :  Tyrell Antonio   CHIEF COMPLAINT:  Respiratory failure   INITIAL PRESENTATION: 53 yo female with hx asthma, DM, HTN, psoriatic arthritis on methotrexate and chronic prednisone presented 8/18 with fever, myalgias, tremors and SOB.  Admitted by [facility] with suspected PNA, acute renal failure and ?serotonin syndrome.  Had worsening SOB and mental status, started on bipap and PCCM consulted.   STUDIES:    SIGNIFICANT EVENTS: 8/19 admit, bipap, persistent hypercarbia    HISTORY OF PRESENT ILLNESS: 53 yo female with hx asthma, DM, HTN, psoriatic arthritis on methotrexate and chronic prednisone presented 8/18 with fever, myalgias, tremors and SOB.  Admitted by [facility] with suspected PNA and ?serotonin syndrome.  Had worsening SOB and mental status, started on bipap and PCCM consulted.  Of note she was started on lexapro approx 2 weeks prior to admit.      PAST MEDICAL HISTORY :  Past Medical History  Diagnosis Date  . Essential hypertension   . Eczema   . Asthma   . Diabetes mellitus   . Chronic insomnia   . Arthritis   . Candida infection   . GERD (gastroesophageal reflux disease)    Past Surgical History  Procedure Laterality Date  . Cesarean section    . Tubal ligation     Prior to Admission medications   Medication Sig Start Date End Date Taking? Authorizing Provider  albuterol (PROVENTIL HFA;VENTOLIN HFA) 108 (90 BASE) MCG/ACT inhaler Inhale 2 puffs into the lungs every 6 (six) hours as needed. For shortness of breath.   Yes Historical Provider, MD  albuterol (PROVENTIL) (2.5 MG/3ML) 0.083% nebulizer solution Take 2.5 mg by nebulization every 6 (six) hours as needed. For shortness of breath.   Yes Historical Provider, MD  allopurinol (ZYLOPRIM) 100 MG tablet Take 100 mg by mouth daily.   Yes Historical Provider, MD   amLODipine (NORVASC) 10 MG tablet Take 10 mg by mouth daily.   Yes Historical Provider, MD  aspirin EC 81 MG tablet Take 81 mg by mouth daily.   Yes Historical Provider, MD  bisoprolol (ZEBETA) 10 MG tablet Take 20 mg by mouth daily.   Yes Historical Provider, MD  EPINEPHrine (EPIPEN) 0.3 mg/0.3 mL SOAJ injection Inject 0.3 mLs (0.3 mg total) into the muscle once. 09/30/12  Yes Thao P Le, DO  esomeprazole (NEXIUM) 20 MG capsule Take 20 mg by mouth daily before breakfast.   Yes Historical Provider, MD  esomeprazole (NEXIUM) 40 MG capsule Take 1 capsule (40 mg total) by mouth daily. 10/12/12  Yes Orma Flaming, MD  gabapentin (NEURONTIN) 100 MG capsule Take 100-200 mg by mouth 3 (three) times daily. Take 100 MG twice daily, and 200 MG at bedtime.   Yes Historical Provider, MD  glyBURIDE (DIABETA) 2.5 MG tablet Take 2.5-7.5 mg by mouth 2 (two) times daily with a meal. Take 7.5 MG in the morning and 2.5 MG at night.   Yes Historical Provider, MD  lisinopril (PRINIVIL,ZESTRIL) 20 MG tablet Take 20 mg by mouth daily.   Yes Historical Provider, MD  metFORMIN (GLUCOPHAGE) 1000 MG tablet Take 1,000 mg by mouth 2 (two) times daily with a meal.   Yes Historical Provider, MD  methotrexate 25 MG/ML SOLN Inject 15 mg into the vein every 7 (seven) days. Inject on Saturdays. 15 MG = 0.6 mL   Yes  Historical Provider, MD  montelukast (SINGULAIR) 10 MG tablet Take 10 mg by mouth at bedtime.   Yes Historical Provider, MD  oxyCODONE (ROXICODONE) 15 MG immediate release tablet Take 15 mg by mouth 2 (two) times daily as needed. For pain.   Yes Historical Provider, MD  pantoprazole (PROTONIX) 40 MG tablet Take 40 mg by mouth daily.   Yes Historical Provider, MD  predniSONE (DELTASONE) 5 MG tablet Take 7.5 mg by mouth daily.   Yes Historical Provider, MD  ALPRAZolam Duanne Moron) 0.25 MG tablet Take one or two every 8 hours as needed for anxiety 09/18/11   Gay Filler Copland, MD  azithromycin (ZITHROMAX) 500 MG tablet Take 1 tablet  (500 mg total) by mouth daily. 10/12/12   Orma Flaming, MD  chlorthalidone (HYGROTON) 25 MG tablet Take 25 mg by mouth daily.    Historical Provider, MD  HYDROcodone-acetaminophen (HYCET) 7.5-325 mg/15 ml solution Take 5 mLs by mouth every 6 (six) hours as needed for pain (or cough). 10/12/12   Orma Flaming, MD  varenicline (CHANTIX) 1 MG tablet Take 1 mg by mouth 2 (two) times daily.    Historical Provider, MD  zolpidem (AMBIEN) 10 MG tablet Take 10 mg by mouth at bedtime as needed. For sleep.    Historical Provider, MD   No Known Allergies  FAMILY HISTORY:  Family History  Problem Relation Age of Onset  . Lung cancer Mother   . Diabetes Sister   . Heart disease Sister   . Thyroid cancer      1/2 sister  . Heart disease      1/2 sister   SOCIAL HISTORY:  reports that she has been smoking.  She does not have any smokeless tobacco history on file. She reports that she drinks alcohol. She reports that she does not use illicit drugs.  REVIEW OF SYSTEMS:  Obtained from records as per HPI.  Pt lethargic.   SUBJECTIVE:   VITAL SIGNS: Temp:  [98.6 F (37 C)-100.4 F (38 C)] 98.6 F (37 C) (08/19 0724) Pulse Rate:  [73-98] 73 (08/19 0746) Resp:  [11-16] 15 (08/19 0746) BP: (90-129)/(51-69) 90/51 mmHg (08/19 0746) SpO2:  [87 %-100 %] 92 % (08/19 0753) FiO2 (%):  [21 %] 21 % (08/19 0753) Weight:  [191 lb (86.637 kg)-237 lb 14 oz (107.9 kg)] 237 lb 14 oz (107.9 kg) (08/19 0636) HEMODYNAMICS:   VENTILATOR SETTINGS: Vent Mode:  [-]  FiO2 (%):  [21 %] 21 % INTAKE / OUTPUT: No intake or output data in the 24 hours ending 09/28/13 [id number]  PHYSICAL EXAMINATION: General:  Obese female, NAD, somnolent on bipap  Neuro:  Somnolent, arouses to loud voice but falls quickly back to sleep, follows some commands, no evidence of reported tremors/spasms noted, MAE HEENT:  Mm dry, Bipap  Cardiovascular:  s1s2 rrr, NSR 70's  Lungs:  resps even, non labored on bipap, exp wheeze throughout, diminished  bases  Abdomen:  Round, soft, non tender, +bs Musculoskeletal:  Warm and dry, 1+ BLE edema, NEG clonus   LABS:  CBC  Recent Labs Lab 09/28/13 0040  WBC 5.5  HGB 7.4*  HCT 23.9*  PLT 235   Coag's No results found for this basename: APTT, INR,  in the last 168 hours BMET  Recent Labs Lab 09/28/13 0040  NA 132*  K 4.0  CL 96  CO2 24  BUN 30*  CREATININE 2.50*  GLUCOSE 114*   Electrolytes  Recent Labs Lab 09/28/13 0040  CALCIUM 10.1  Sepsis Markers  Recent Labs Lab 09/28/13 0109  LATICACIDVEN 0.77   ABG  Recent Labs Lab 09/28/13 0412 09/28/13 0700 09/28/13 0905  PHART 7.263* 7.264* 7.282*  PCO2ART 50.6* 51.9* 47.8*  PO2ART 85.0 107.0* 61.5*   Liver Enzymes No results found for this basename: AST, ALT, ALKPHOS, BILITOT, ALBUMIN,  in the last 168 hours Cardiac Enzymes  Recent Labs Lab 09/28/13 0040  TROPONINI <0.30   Glucose  Recent Labs Lab 09/28/13 0740  GLUCAP 100*    Imaging Dg Chest 2 View  09/28/2013   CLINICAL DATA:  Chest pain.  EXAM: CHEST  2 VIEW  COMPARISON:  04/06/2009.  FINDINGS: The heart is mildly enlarged but stable. There is mild tortuosity of the thoracic aorta. The lungs are clear. No pleural effusion. The bony thorax is intact.  IMPRESSION: Stable cardiac enlargement.  No acute pulmonary findings.   Electronically Signed   By: Kalman Jewels M.D.   On: 09/28/2013 00:27     ASSESSMENT / PLAN:  PULMONARY Acute hypercarbic/ hypoxic respiratory failure - unclear etiology.  Suspect some degree OHS and ?OSA +/- developing PNA.  Hx asthma  P:   Cont bipap support for now  F/u ABG  F/u CXR  Check RVP - immunosuppresed  Tenuous - low threshold to intubate if no improvement mental status  BD's  BLE doppler  CARDIOVASCULAR Hypotension  Hx HTN  P:  Stress steroids  D/c home anti-HTN Gentle volume  Does not meet SIRS criteria, lactate reassuring  Consider 2D echo   RENAL Acute renal failure - suspect  multifactorial r/t dehydration, ACE, NSAIDs Hyponatremia - mild  P:   Cont volume resuscitation  F/u chem   GASTROINTESTINAL No active issue  P:   NPO for now   HEMATOLOGIC Anemia  P:  F/u cbc  SQ heparin   INFECTIOUS ?PNA - no obvious infiltrate but suspect may fluff out once she is rehydrated  Immunocompromised - on chronic methotrexate  Fever - on immunosuppression so may not develop leukocytosis  P:   BCx2 8/19>>> UA - neg  Vanc 8/19>>> Zosyn 8/19>>> RVP 8/19>>> Follow culture data  abx as above  Hold methotrexate for now  Check RVP  Check pct   ENDOCRINE Relative adrenal insufficiency - chronic prednisone  DM P:   SSI  Stress steroids   NEUROLOGIC AMS - multifactorial r/t hypercarbia, hypotension/SIRS exacerbated by Ativan admin  ?seratonin syndrome - recently started on lexapro, presented with reports of "tremors, spasms" but otherwise no s/s - not tachycardic, hypertensive, no clonus, etc.  P:   Avoid oversedation  bipap as tol  See resp  CT head pending    TODAY'S SUMMARY:   53yo with fever unclear etiology, resp failure, AMS, renal failure.  Cont bipap, f/u cultures, volume resuscitation.  Tenuous resp status, tx ICU and monitor closely.   Nickolas Madrid, NP 09/28/2013  9:22 AM Pager: [phone] or [phone]  Will cancel transfer to ICU, patient is slowly improving with appropriate BiPAP settings.  Will repeat ABG in one hour and pending results will determine need for transfer.  I have personally obtained a history, examined the patient, evaluated laboratory and imaging results, formulated the assessment and plan and placed orders.  CRITICAL CARE: The patient is critically ill with multiple organ systems failure and requires high complexity decision making for assessment and support, frequent evaluation and titration of therapies, application of advanced monitoring technologies and extensive interpretation of multiple databases. Critical  Care Time devoted to patient  care services described in this note is 40 minutes.   Rush Farmer, M.D. [facility] Pulmonary/Critical Care Medicine. Pager: [phone]. After hours pager: [phone].

## 2013-09-28 NOTE — Progress Notes (Signed)
ANTIBIOTIC CONSULT NOTE - INITIAL  Pharmacy Consult for Vancomycin/Zosyn  Indication: rule out pneumonia  No Known Allergies  Patient Measurements: Height: 5\' 2"  (157.5 cm) Weight: 237 lb 14 oz (107.9 kg) IBW/kg (Calculated) : 50.1  Vital Signs: Temp: 98.6 F (37 C) (08/19 0724) Temp src: Oral (08/19 0724) BP: 97/60 mmHg (08/19 0724) Pulse Rate: 75 (08/19 0724)  Labs:  Recent Labs  09/28/13 0040  WBC 5.5  HGB 7.4*  PLT 235  CREATININE 2.50*   Estimated Creatinine Clearance: 30.1 ml/min (by C-G formula based on Cr of 2.5).   Medical History: Past Medical History  Diagnosis Date  . Essential hypertension   . Eczema   . Asthma   . Diabetes mellitus   . Chronic insomnia   . Arthritis   . Candida infection   . GERD (gastroesophageal reflux disease)     Assessment: 53 y/o F here with fever, possible PNA, WBC WNL, acute renal failure with CrCl ~ 30, other labs as above.   Goal of Therapy:  Vancomycin trough level 15-20 mcg/ml  Plan:  -Vancomycin 1500 mg IV q24h, increase dose if renal function improves -Zosyn 3.375G IV q8h to be infused over 4 hours -Trend WBC, temp, renal function  -Drug levels as indicated   Narda Bonds 09/28/2013,7:38 AM

## 2013-09-28 NOTE — ED Notes (Signed)
[facility] here for pt transport.

## 2013-09-28 NOTE — Progress Notes (Signed)
*  Preliminary Results* Bilateral lower extremity venous duplex completed. Bilateral lower extremities are negative for deep vein thrombosis. There is no evidence of Baker's cyst bilaterally.  09/28/2013 1:19 PM  Maudry Mayhew, RVT, RDCS, RDMS

## 2013-09-28 NOTE — ED Notes (Signed)
Pt report called and given to Junie Panning, RN regarding change in pt status.

## 2013-09-28 NOTE — Progress Notes (Signed)
RT note- Patient was transported to CT with no difficulties, following commands and tolerating Bipap well. Patient will be transported to ICU, report given to unit therapist.

## 2013-09-28 NOTE — ED Notes (Signed)
resp at bs stating that pt needed to be placed on bipap for underventilation and blood gas values.

## 2013-09-28 NOTE — H&P (Addendum)
[facility] History and Physical  Jodi Bell [id number] DOB: 02/13/1960 DOA: 09/27/2013  Referring physician: Dr Chaya Jan.  PCP: Default, Provider, MD   Chief Complaint: Fever, tremors.   HPI: Jodi Bell is a 53 y.o. female with PMH significant for Diabetes, Hypertension, Psoriasis Arthritis on methotrexate and chronic prednisone, depression, recently started on lexapro 2 weeks ago who presents with tremors, spasm, of all 4 extremities, and SOB. Patient unable to provide history due to AMS.   In the ED she was given 1 Mg of Ativan. She was started on BIPAP. ABG: PH 7.2. PCO2 at 51, PO2 107, SBP in the 90.      Review of Systems:  Unable to obtain from patient. .   Past Medical History  Diagnosis Date  . Essential hypertension   . Eczema   . Asthma   . Diabetes mellitus   . Chronic insomnia   . Arthritis   . Candida infection   . GERD (gastroesophageal reflux disease)    Past Surgical History  Procedure Laterality Date  . Cesarean section    . Tubal ligation     Social History:  reports that she has been smoking.  She does not have any smokeless tobacco history on file. She reports that she drinks alcohol. She reports that she does not use illicit drugs.  No Known Allergies  Family History  Problem Relation Age of Onset  . Lung cancer Mother   . Diabetes Sister   . Heart disease Sister   . Thyroid cancer      1/2 sister  . Heart disease      1/2 sister     Prior to Admission medications   Medication Sig Start Date End Date Taking? Authorizing Provider  albuterol (PROVENTIL HFA;VENTOLIN HFA) 108 (90 BASE) MCG/ACT inhaler Inhale 2 puffs into the lungs every 6 (six) hours as needed. For shortness of breath.   Yes Historical Provider, MD  albuterol (PROVENTIL) (2.5 MG/3ML) 0.083% nebulizer solution Take 2.5 mg by nebulization every 6 (six) hours as needed. For shortness of breath.   Yes Historical Provider, MD  allopurinol (ZYLOPRIM) 100 MG tablet  Take 100 mg by mouth daily.   Yes Historical Provider, MD  amLODipine (NORVASC) 10 MG tablet Take 10 mg by mouth daily.   Yes Historical Provider, MD  aspirin EC 81 MG tablet Take 81 mg by mouth daily.   Yes Historical Provider, MD  bisoprolol (ZEBETA) 10 MG tablet Take 20 mg by mouth daily.   Yes Historical Provider, MD  EPINEPHrine (EPIPEN) 0.3 mg/0.3 mL SOAJ injection Inject 0.3 mLs (0.3 mg total) into the muscle once. 09/30/12  Yes Thao P Le, DO  esomeprazole (NEXIUM) 20 MG capsule Take 20 mg by mouth daily before breakfast.   Yes Historical Provider, MD  esomeprazole (NEXIUM) 40 MG capsule Take 1 capsule (40 mg total) by mouth daily. 10/12/12  Yes Orma Flaming, MD  gabapentin (NEURONTIN) 100 MG capsule Take 100-200 mg by mouth 3 (three) times daily. Take 100 MG twice daily, and 200 MG at bedtime.   Yes Historical Provider, MD  glyBURIDE (DIABETA) 2.5 MG tablet Take 2.5-7.5 mg by mouth 2 (two) times daily with a meal. Take 7.5 MG in the morning and 2.5 MG at night.   Yes Historical Provider, MD  lisinopril (PRINIVIL,ZESTRIL) 20 MG tablet Take 20 mg by mouth daily.   Yes Historical Provider, MD  metFORMIN (GLUCOPHAGE) 1000 MG tablet Take 1,000 mg by mouth 2 (two) times  daily with a meal.   Yes Historical Provider, MD  methotrexate 25 MG/ML SOLN Inject 15 mg into the vein every 7 (seven) days. Inject on Saturdays. 15 MG = 0.6 mL   Yes Historical Provider, MD  montelukast (SINGULAIR) 10 MG tablet Take 10 mg by mouth at bedtime.   Yes Historical Provider, MD  oxyCODONE (ROXICODONE) 15 MG immediate release tablet Take 15 mg by mouth 2 (two) times daily as needed. For pain.   Yes Historical Provider, MD  pantoprazole (PROTONIX) 40 MG tablet Take 40 mg by mouth daily.   Yes Historical Provider, MD  predniSONE (DELTASONE) 5 MG tablet Take 7.5 mg by mouth daily.   Yes Historical Provider, MD  ALPRAZolam Duanne Moron) 0.25 MG tablet Take one or two every 8 hours as needed for anxiety 09/18/11   Gay Filler Copland, MD   azithromycin (ZITHROMAX) 500 MG tablet Take 1 tablet (500 mg total) by mouth daily. 10/12/12   Orma Flaming, MD  chlorthalidone (HYGROTON) 25 MG tablet Take 25 mg by mouth daily.    Historical Provider, MD  HYDROcodone-acetaminophen (HYCET) 7.5-325 mg/15 ml solution Take 5 mLs by mouth every 6 (six) hours as needed for pain (or cough). 10/12/12   Orma Flaming, MD  varenicline (CHANTIX) 1 MG tablet Take 1 mg by mouth 2 (two) times daily.    Historical Provider, MD  zolpidem (AMBIEN) 10 MG tablet Take 10 mg by mouth at bedtime as needed. For sleep.    Historical Provider, MD   Physical Exam: Filed Vitals:   09/28/13 [id number] 09/28/13 0636 09/28/13 0724 09/28/13 0746  BP:  98/60 97/60 90/51   Pulse: 78 77 75 73  Temp:  99.8 F (37.7 C) 98.6 F (37 C)   TempSrc:  Axillary Oral   Resp: 14 11 11    Height:  5\' 2"  (1.575 m)    Weight:  107.9 kg (237 lb 14 oz)    SpO2: 100% 100% 100% 98%    Wt Readings from Last 3 Encounters:  09/28/13 107.9 kg (237 lb 14 oz)  10/12/12 83.462 kg (184 lb)  09/30/12 84.641 kg (186 lb 9.6 oz)    General:  Lethargic, open eyes to voice, on BIPAP.  Eyes: PERRL, normal lids, irises & conjunctiva ENT: grossly normal hearing, lips & tongue Neck: no LAD, masses or thyromegaly Cardiovascular: RRR, no m/r/g. No LE edema. Telemetry: SR, no arrhythmias  Respiratory: CTA bilaterally, no w/r/r. On BIPAP.  Abdomen: soft, ntnd Skin: no rash or induration seen on limited exam Musculoskeletal: grossly normal tone BUE/BLE Neurologic: Lethargic on BIPA, open eyes to command, able to hold upper extremities up for few seconds.           Labs on Admission:  Basic Metabolic Panel:  Recent Labs Lab 09/28/13 0040  NA 132*  K 4.0  CL 96  CO2 24  GLUCOSE 114*  BUN 30*  CREATININE 2.50*  CALCIUM 10.1   Liver Function Tests: No results found for this basename: AST, ALT, ALKPHOS, BILITOT, PROT, ALBUMIN,  in the last 168 hours No results found for this basename: LIPASE,  AMYLASE,  in the last 168 hours No results found for this basename: AMMONIA,  in the last 168 hours CBC:  Recent Labs Lab 09/28/13 0040  WBC 5.5  NEUTROABS 3.9  HGB 7.4*  HCT 23.9*  MCV 89.5  PLT 235   Cardiac Enzymes:  Recent Labs Lab 09/28/13 0040  TROPONINI <0.30    BNP (last 3 results) No results  found for this basename: PROBNP,  in the last 8760 hours CBG: No results found for this basename: GLUCAP,  in the last 168 hours  Radiological Exams on Admission: Dg Chest 2 View  09/28/2013   CLINICAL DATA:  Chest pain.  EXAM: CHEST  2 VIEW  COMPARISON:  04/06/2009.  FINDINGS: The heart is mildly enlarged but stable. There is mild tortuosity of the thoracic aorta. The lungs are clear. No pleural effusion. The bony thorax is intact.  IMPRESSION: Stable cardiac enlargement.  No acute pulmonary findings.   Electronically Signed   By: Kalman Jewels M.D.   On: 09/28/2013 00:27    EKG: Independently reviewed. Normal sinus rhythm.   Assessment/Plan Active Problems:   Serotonin syndrome   Respiratory failure   1-Acute Respiratory Failure: Patient on BIPAP, no improvement on ABG. She is lethargic, respond to voice. BIPAP setting adjusted. CCM consulted. High risk for intubation.   2-Encephalopathy: Multifactorial, medication, ativan, acidosis. Will order CT head.   3-Hypotension: IV fluids, IV bolus, IV hydrocortisone, stress dose. Blood culture ordered. Start IV vancomycin and zosyn empirically.   4-Psoriatic arthritis. Hold methotrexate.  5-Diabetes; Hold hypoglycemic agents. Check CBG.  6-Acute renal failure; IV fluids. Check bladder scan, I and O. UA.  7-Anemia; Type and screen. Repeat CBC , might need Blood transfusion.  8-Tremors, fever: question serotonin syndrome, monitor. Patient received ativan. Hold on sedatives due to encephalopathy. CT head.   Code Status: Full Code.  DVT Prophylaxis:SCD for now, Hb low.  Family Communication: none at bedside.  Disposition  Plan: expect 4 to 5 days.   Time spent: 75 minutes.   Niel Hummer A [facility] Pager [phone]  **Disclaimer: This note may have been dictated with voice recognition software. Similar sounding words can inadvertently be transcribed and this note may contain transcription errors which may not have been corrected upon publication of note.**

## 2013-09-28 NOTE — Progress Notes (Addendum)
Per DR Tyrell Antonio for RT to manage bipap and get abg @ 830

## 2013-09-28 NOTE — ED Notes (Signed)
[facility] here for pt transport, unable to transport pt at this time due to her resp status and being placed on bipap. [facility] will return to pick up pt after obtaining additional staff. EDP made aware.

## 2013-09-28 NOTE — Progress Notes (Signed)
Utilization Review Completed.Donne Anon T8/19/2015

## 2013-09-28 NOTE — Progress Notes (Signed)
Placed patient on BiPAP per MD order

## 2013-09-28 NOTE — ED Provider Notes (Addendum)
CSN: [id number]     Arrival date & time 09/27/13  2254 History   First MD Initiated Contact with Patient 09/28/13 0002     Chief Complaint  Patient presents with  . Fever     (Consider location/radiation/quality/duration/timing/severity/associated sxs/prior Treatment) Patient is a 53 y.o. female presenting with fever. The history is provided by the patient and a relative. No language interpreter was used.  Fever Temp source:  Oral Severity:  Moderate Onset quality:  Gradual Timing:  Constant Progression:  Unchanged Chronicity:  New Relieved by:  Nothing Worsened by:  Nothing tried Ineffective treatments:  None tried Associated symptoms: myalgias and sore throat   Associated symptoms: no chest pain, no confusion, no cough, no diarrhea, no dysuria, no nausea, no rash, no somnolence and no vomiting   Associated symptoms comment:  Tremors and spasms in all 4 extremities.  Has also been short of breath for a while is concerned that the lexapro she started 2 weeks ago is the cause as she read it's potential side effects Risk factors: no recent surgery   Shortness of breath has been ongoing.  Has stopped smoking denied cough but reports some wheezing.  Does not use home O2.  Denied GI and urinary symptoms.    Past Medical History  Diagnosis Date  . Essential hypertension   . Eczema   . Asthma   . Diabetes mellitus   . Chronic insomnia   . Arthritis   . Candida infection   . GERD (gastroesophageal reflux disease)    Past Surgical History  Procedure Laterality Date  . Cesarean section    . Tubal ligation     Family History  Problem Relation Age of Onset  . Lung cancer Mother   . Diabetes Sister   . Heart disease Sister   . Thyroid cancer      1/2 sister  . Heart disease      1/2 sister   History  Substance Use Topics  . Smoking status: Current Every Day Smoker    Last Attempt to Quit: 07/19/2011  . Smokeless tobacco: Not on file  . Alcohol Use: Yes   OB History   Grav Para Term Preterm Abortions TAB SAB Ect Mult Living                 Review of Systems  Constitutional: Positive for fever.  HENT: Positive for sore throat. Negative for trouble swallowing and voice change.   Eyes: Negative for photophobia.  Respiratory: Positive for shortness of breath and wheezing. Negative for cough.   Cardiovascular: Negative for chest pain and palpitations.  Gastrointestinal: Negative for nausea, vomiting and diarrhea.  Genitourinary: Negative for dysuria.  Musculoskeletal: Positive for myalgias. Negative for neck pain and neck stiffness.  Skin: Negative for rash.  Neurological: Positive for tremors. Negative for facial asymmetry, weakness and numbness.  Psychiatric/Behavioral: Negative for confusion.      Allergies  Review of patient's allergies indicates no known allergies.  Home Medications   Prior to Admission medications   Medication Sig Start Date End Date Taking? Authorizing Provider  albuterol (PROVENTIL HFA;VENTOLIN HFA) 108 (90 BASE) MCG/ACT inhaler Inhale 2 puffs into the lungs every 6 (six) hours as needed. For shortness of breath.   Yes Historical Provider, MD  albuterol (PROVENTIL) (2.5 MG/3ML) 0.083% nebulizer solution Take 2.5 mg by nebulization every 6 (six) hours as needed. For shortness of breath.   Yes Historical Provider, MD  allopurinol (ZYLOPRIM) 100 MG tablet Take 100 mg by mouth  daily.   Yes Historical Provider, MD  amLODipine (NORVASC) 10 MG tablet Take 10 mg by mouth daily.   Yes Historical Provider, MD  aspirin EC 81 MG tablet Take 81 mg by mouth daily.   Yes Historical Provider, MD  bisoprolol (ZEBETA) 10 MG tablet Take 20 mg by mouth daily.   Yes Historical Provider, MD  EPINEPHrine (EPIPEN) 0.3 mg/0.3 mL SOAJ injection Inject 0.3 mLs (0.3 mg total) into the muscle once. 09/30/12  Yes Thao P Le, DO  esomeprazole (NEXIUM) 20 MG capsule Take 20 mg by mouth daily before breakfast.   Yes Historical Provider, MD  esomeprazole  (NEXIUM) 40 MG capsule Take 1 capsule (40 mg total) by mouth daily. 10/12/12  Yes Orma Flaming, MD  gabapentin (NEURONTIN) 100 MG capsule Take 100-200 mg by mouth 3 (three) times daily. Take 100 MG twice daily, and 200 MG at bedtime.   Yes Historical Provider, MD  glyBURIDE (DIABETA) 2.5 MG tablet Take 2.5-7.5 mg by mouth 2 (two) times daily with a meal. Take 7.5 MG in the morning and 2.5 MG at night.   Yes Historical Provider, MD  lisinopril (PRINIVIL,ZESTRIL) 20 MG tablet Take 20 mg by mouth daily.   Yes Historical Provider, MD  metFORMIN (GLUCOPHAGE) 1000 MG tablet Take 1,000 mg by mouth 2 (two) times daily with a meal.   Yes Historical Provider, MD  methotrexate 25 MG/ML SOLN Inject 15 mg into the vein every 7 (seven) days. Inject on Saturdays. 15 MG = 0.6 mL   Yes Historical Provider, MD  montelukast (SINGULAIR) 10 MG tablet Take 10 mg by mouth at bedtime.   Yes Historical Provider, MD  oxyCODONE (ROXICODONE) 15 MG immediate release tablet Take 15 mg by mouth 2 (two) times daily as needed. For pain.   Yes Historical Provider, MD  pantoprazole (PROTONIX) 40 MG tablet Take 40 mg by mouth daily.   Yes Historical Provider, MD  predniSONE (DELTASONE) 5 MG tablet Take 7.5 mg by mouth daily.   Yes Historical Provider, MD  ALPRAZolam Duanne Moron) 0.25 MG tablet Take one or two every 8 hours as needed for anxiety 09/18/11   Gay Filler Copland, MD  azithromycin (ZITHROMAX) 500 MG tablet Take 1 tablet (500 mg total) by mouth daily. 10/12/12   Orma Flaming, MD  chlorthalidone (HYGROTON) 25 MG tablet Take 25 mg by mouth daily.    Historical Provider, MD  HYDROcodone-acetaminophen (HYCET) 7.5-325 mg/15 ml solution Take 5 mLs by mouth every 6 (six) hours as needed for pain (or cough). 10/12/12   Orma Flaming, MD  varenicline (CHANTIX) 1 MG tablet Take 1 mg by mouth 2 (two) times daily.    Historical Provider, MD  zolpidem (AMBIEN) 10 MG tablet Take 10 mg by mouth at bedtime as needed. For sleep.    Historical Provider, MD    BP 129/63  Pulse 88  Temp(Src) 99.9 F (37.7 C) (Oral)  Resp 14  Ht 5\' 2"  (1.575 m)  Wt 191 lb (86.637 kg)  BMI 34.93 kg/m2  SpO2 98% Physical Exam  Constitutional: She is oriented to person, place, and time. She appears well-developed and well-nourished.  HENT:  Head: Normocephalic and atraumatic.  Right Ear: External ear normal.  Left Ear: External ear normal.  Mouth/Throat: No oropharyngeal exudate.  Mild left tonsillar swelling  Eyes: Conjunctivae and EOM are normal. Pupils are equal, round, and reactive to light.  Neck: Normal range of motion. Neck supple.  No pain with displacement of the trachea.  No meningismus  Cardiovascular: Normal rate, regular rhythm and intact distal pulses.   Pulmonary/Chest: No stridor. No respiratory distress. She has decreased breath sounds. She has no wheezes. She has no rales. She exhibits no tenderness.  Abdominal: Soft. Bowel sounds are normal. There is no tenderness. There is no rebound and no guarding.  Musculoskeletal: She exhibits edema. She exhibits no tenderness.  Lymphadenopathy:    She has no cervical adenopathy.  Neurological: She is alert and oriented to person, place, and time.  Tremors, hyperreflexia and some mild rigidity of the knees and elbows; + clonus B  Skin: Skin is warm and dry. No rash noted. She is not diaphoretic.  Psychiatric: She has a normal mood and affect.    ED Course  Procedures (including critical care time) Labs Review Labs Reviewed  BASIC METABOLIC PANEL - Abnormal; Notable for the following:    Sodium 132 (*)    Glucose, Bld 114 (*)    BUN 30 (*)    Creatinine, Ser 2.50 (*)    GFR calc non Af Amer 21 (*)    GFR calc Af Amer 24 (*)    All other components within normal limits  CBC WITH DIFFERENTIAL - Abnormal; Notable for the following:    RBC 2.67 (*)    Hemoglobin 7.4 (*)    HCT 23.9 (*)    RDW 20.9 (*)    All other components within normal limits  URINALYSIS, ROUTINE W REFLEX MICROSCOPIC -  Abnormal; Notable for the following:    Protein, ur 100 (*)    All other components within normal limits  RAPID STREP SCREEN  CULTURE, GROUP A STREP  URINE MICROSCOPIC-ADD ON  TROPONIN I  I-STAT CG4 LACTIC ACID, ED    Imaging Review Dg Chest 2 View  09/28/2013   CLINICAL DATA:  Chest pain.  EXAM: CHEST  2 VIEW  COMPARISON:  04/06/2009.  FINDINGS: The heart is mildly enlarged but stable. There is mild tortuosity of the thoracic aorta. The lungs are clear. No pleural effusion. The bony thorax is intact.  IMPRESSION: Stable cardiac enlargement.  No acute pulmonary findings.   Electronically Signed   By: Kalman Jewels M.D.   On: 09/28/2013 00:27     EKG Interpretation None      Date: 09/28/2013  Rate: 87  Rhythm: normal sinus rhythm  QRS Axis: normal  Intervals: normal  ST/T Wave abnormalities: normal  Conduction Disutrbances: none  Narrative Interpretation: unremarkable     MDM   Final diagnoses:  Serotonin syndrome  Acute renal failure with other specified pathological lesion in kidney  Hypoxia   MDM Reviewed: previous chart, nursing note and vitals Interpretation: labs, ECG and x-ray (NACPD, no white count negative lactate acute renal failure.  Acidotic and hypoventilating on abg) Total time providing critical care: 75-105 minutes. This excludes time spent performing separately reportable procedures and services. Consults: admitting MD   Medications  cefTRIAXone (ROCEPHIN) 1 G injection (not administered)  sodium chloride 0.9 % bolus 1,000 mL (0 mLs Intravenous Stopped 09/28/13 0230)  LORazepam (ATIVAN) injection 1 mg (1 mg Intravenous Given 09/28/13 0333)  acetaminophen (TYLENOL) tablet 1,000 mg (1,000 mg Oral Given 09/28/13 0334)  sodium chloride 0.9 % bolus 500 mL (500 mLs Intravenous New Bag/Given 09/28/13 0245)  cefTRIAXone (ROCEPHIN) 1 g in dextrose 5 % 50 mL IVPB (0 g Intravenous Stopped 09/28/13 0400)  azithromycin (ZITHROMAX) 500 mg in dextrose 5 % 250 mL IVPB  (500 mg Intravenous New Bag/Given 09/28/13 0339)  Results for orders placed during the hospital encounter of 09/27/13  RAPID STREP SCREEN      Result Value Ref Range   Streptococcus, Group A Screen (Direct) NEGATIVE  NEGATIVE  BASIC METABOLIC PANEL      Result Value Ref Range   Sodium 132 (*) 137 - 147 mEq/L   Potassium 4.0  3.7 - 5.3 mEq/L   Chloride 96  96 - 112 mEq/L   CO2 24  19 - 32 mEq/L   Glucose, Bld 114 (*) 70 - 99 mg/dL   BUN 30 (*) 6 - 23 mg/dL   Creatinine, Ser 2.50 (*) 0.50 - 1.10 mg/dL   Calcium 10.1  8.4 - 10.5 mg/dL   GFR calc non Af Amer 21 (*) >90 mL/min   GFR calc Af Amer 24 (*) >90 mL/min   Anion gap 12  5 - 15  CBC WITH DIFFERENTIAL      Result Value Ref Range   WBC 5.5  4.0 - 10.5 K/uL   RBC 2.67 (*) 3.87 - 5.11 MIL/uL   Hemoglobin 7.4 (*) 12.0 - 15.0 g/dL   HCT 23.9 (*) 36.0 - 46.0 %   MCV 89.5  78.0 - 100.0 fL   MCH 27.7  26.0 - 34.0 pg   MCHC 31.0  30.0 - 36.0 g/dL   RDW 20.9 (*) 11.5 - 15.5 %   Platelets 235  150 - 400 K/uL   Neutrophils Relative % 70  43 - 77 %   Neutro Abs 3.9  1.7 - 7.7 K/uL   Lymphocytes Relative 23  12 - 46 %   Lymphs Abs 1.3  0.7 - 4.0 K/uL   Monocytes Relative 3  3 - 12 %   Monocytes Absolute 0.2  0.1 - 1.0 K/uL   Eosinophils Relative 3  0 - 5 %   Eosinophils Absolute 0.2  0.0 - 0.7 K/uL   Basophils Relative 0  0 - 1 %   Basophils Absolute 0.0  0.0 - 0.1 K/uL  URINALYSIS, ROUTINE W REFLEX MICROSCOPIC      Result Value Ref Range   Color, Urine YELLOW  YELLOW   APPearance CLEAR  CLEAR   Specific Gravity, Urine 1.007  1.005 - 1.030   pH 5.0  5.0 - 8.0   Glucose, UA NEGATIVE  NEGATIVE mg/dL   Hgb urine dipstick NEGATIVE  NEGATIVE   Bilirubin Urine NEGATIVE  NEGATIVE   Ketones, ur NEGATIVE  NEGATIVE mg/dL   Protein, ur 100 (*) NEGATIVE mg/dL   Urobilinogen, UA 0.2  0.0 - 1.0 mg/dL   Nitrite NEGATIVE  NEGATIVE   Leukocytes, UA NEGATIVE  NEGATIVE  URINE MICROSCOPIC-ADD ON      Result Value Ref Range   Squamous  Epithelial / LPF RARE  RARE   WBC, UA 0-2  <3 WBC/hpf   RBC / HPF 0-2  <3 RBC/hpf   Bacteria, UA RARE  RARE  TROPONIN I      Result Value Ref Range   Troponin I <0.30  <0.30 ng/mL  OCCULT BLOOD X 1 CARD TO LAB, STOOL      Result Value Ref Range   Fecal Occult Bld NEGATIVE  NEGATIVE  I-STAT CG4 LACTIC ACID, ED      Result Value Ref Range   Lactic Acid, Venous 0.77  0.5 - 2.2 mmol/L  I-STAT ARTERIAL BLOOD GAS, ED      Result Value Ref Range   pH, Arterial 7.263 (*) 7.350 - 7.450   pCO2 arterial  50.6 (*) 35.0 - 45.0 mmHg   pO2, Arterial 85.0  80.0 - 100.0 mmHg   Bicarbonate 22.6  20.0 - 24.0 mEq/L   TCO2 24  0 - 100 mmol/L   O2 Saturation 94.0     Acid-base deficit 4.0 (*) 0.0 - 2.0 mmol/L   Patient temperature 100.1 F     Collection site RADIAL, ALLEN'S TEST ACCEPTABLE     Drawn by RT     Sample type ARTERIAL     Dg Chest 2 View  09/28/2013   CLINICAL DATA:  Chest pain.  EXAM: CHEST  2 VIEW  COMPARISON:  04/06/2009.  FINDINGS: The heart is mildly enlarged but stable. There is mild tortuosity of the thoracic aorta. The lungs are clear. No pleural effusion. The bony thorax is intact.  IMPRESSION: Stable cardiac enlargement.  No acute pulmonary findings.   Electronically Signed   By: Kalman Jewels M.D.   On: 09/28/2013 00:27   DDx: 1. Serotonin Syndrome-- due to elevated temperature and tremors and rigidity on arrival will treat with one small dose of benzodiazepine, EDP favored periactin but not in pixis at [facility] 2. Suspect pneumonia, but as patient is clinically dehydrated, it has not fluffed out on CXR and due to MTX may have nl WBC 3. Renal failure likely secondary to dehydration 4. Hypoxia- on arrival first sat 87% in triage- Will need rule out for PE but will need to get VQ or wait to get CT as is in acute renal failure.  Suspect patient has underlying COPD and may be a CO2 retainer  5. Hypoventilation based on ABG, started bipap  CRITICAL CARE Performed by:  Carlisle Beers Total critical care time: 91 minutes Critical care time was exclusive of separately billable procedures and treating other patients. Critical care was necessary to treat or prevent imminent or life-threatening deterioration. Critical care was time spent personally by me on the following activities: development of treatment plan with patient and/or surrogate as well as nursing, discussions with consultants, evaluation of patient's response to treatment, examination of patient, obtaining history from patient or surrogate, ordering and performing treatments and interventions, ordering and review of laboratory studies, ordering and review of radiographic studies, pulse oximetry and re-evaluation of patient's condition.   Carlisle Beers, MD 09/28/13 0630   Aryiah Monterosso K Zaire Vanbuskirk-Rasch, MD 09/28/13 1106

## 2013-09-28 NOTE — Progress Notes (Signed)
Pt is stable on [place] with sats of 100%. BIPAP left in room in case needed throughout the night. RT will continue to monitor.

## 2013-09-28 NOTE — ED Notes (Signed)
Given to [facility] and Erin,RN on 2600 at [facility]

## 2013-09-29 ENCOUNTER — Inpatient Hospital Stay (HOSPITAL_COMMUNITY): Payer: Medicare PPO

## 2013-09-29 DIAGNOSIS — I10 Essential (primary) hypertension: Secondary | ICD-10-CM

## 2013-09-29 DIAGNOSIS — E119 Type 2 diabetes mellitus without complications: Secondary | ICD-10-CM

## 2013-09-29 DIAGNOSIS — J45901 Unspecified asthma with (acute) exacerbation: Secondary | ICD-10-CM

## 2013-09-29 DIAGNOSIS — J189 Pneumonia, unspecified organism: Principal | ICD-10-CM

## 2013-09-29 DIAGNOSIS — E871 Hypo-osmolality and hyponatremia: Secondary | ICD-10-CM

## 2013-09-29 DIAGNOSIS — L4050 Arthropathic psoriasis, unspecified: Secondary | ICD-10-CM

## 2013-09-29 DIAGNOSIS — E2749 Other adrenocortical insufficiency: Secondary | ICD-10-CM

## 2013-09-29 LAB — VITAMIN B12: Vitamin B-12: 730 pg/mL (ref 211–911)

## 2013-09-29 LAB — COMPREHENSIVE METABOLIC PANEL WITH GFR
ALT: 16 U/L (ref 0–35)
AST: 18 U/L (ref 0–37)
Albumin: 2.4 g/dL — ABNORMAL LOW (ref 3.5–5.2)
Alkaline Phosphatase: 45 U/L (ref 39–117)
Anion gap: 12 (ref 5–15)
BUN: 24 mg/dL — ABNORMAL HIGH (ref 6–23)
CO2: 20 meq/L (ref 19–32)
Calcium: 9.2 mg/dL (ref 8.4–10.5)
Chloride: 101 meq/L (ref 96–112)
Creatinine, Ser: 1.61 mg/dL — ABNORMAL HIGH (ref 0.50–1.10)
GFR calc Af Amer: 41 mL/min — ABNORMAL LOW
GFR calc non Af Amer: 36 mL/min — ABNORMAL LOW
Glucose, Bld: 225 mg/dL — ABNORMAL HIGH (ref 70–99)
Potassium: 4.4 meq/L (ref 3.7–5.3)
Sodium: 133 meq/L — ABNORMAL LOW (ref 137–147)
Total Bilirubin: 0.3 mg/dL (ref 0.3–1.2)
Total Protein: 5.9 g/dL — ABNORMAL LOW (ref 6.0–8.3)

## 2013-09-29 LAB — CBC
HCT: 20.9 % — ABNORMAL LOW (ref 36.0–46.0)
Hemoglobin: 6.7 g/dL — CL (ref 12.0–15.0)
MCH: 28.2 pg (ref 26.0–34.0)
MCHC: 32.1 g/dL (ref 30.0–36.0)
MCV: 87.8 fL (ref 78.0–100.0)
Platelets: 184 K/uL (ref 150–400)
RBC: 2.38 MIL/uL — ABNORMAL LOW (ref 3.87–5.11)
RDW: 21.2 % — ABNORMAL HIGH (ref 11.5–15.5)
WBC: 3.7 K/uL — ABNORMAL LOW (ref 4.0–10.5)

## 2013-09-29 LAB — FOLATE: Folate: 4.4 ng/mL

## 2013-09-29 LAB — LIPID PANEL
Cholesterol: 202 mg/dL — ABNORMAL HIGH (ref 0–200)
HDL: 41 mg/dL (ref >39)
LDL Cholesterol: 111 mg/dL — ABNORMAL HIGH (ref 0–99)
Total CHOL/HDL Ratio: 4.9 RATIO
Triglycerides: 248 mg/dL — ABNORMAL HIGH (ref <150)
VLDL: 50 mg/dL — ABNORMAL HIGH (ref 0–40)

## 2013-09-29 LAB — IRON AND TIBC
Iron: 96 ug/dL (ref 42–135)
Saturation Ratios: 27 % (ref 20–55)
TIBC: 353 ug/dL (ref 250–470)
UIBC: 257 ug/dL (ref 125–400)

## 2013-09-29 LAB — BLOOD GAS, ARTERIAL
Acid-base deficit: 3.4 mmol/L — ABNORMAL HIGH (ref 0.0–2.0)
Bicarbonate: 21.6 meq/L (ref 20.0–24.0)
Drawn by: 41877
O2 Content: 3 L/min
O2 Saturation: 98.5 %
Patient temperature: 98.6
TCO2: 22.9 mmol/L (ref 0–100)
pCO2 arterial: 42.5 mmHg (ref 35.0–45.0)
pH, Arterial: 7.327 — ABNORMAL LOW (ref 7.350–7.450)
pO2, Arterial: 107 mmHg — ABNORMAL HIGH (ref 80.0–100.0)

## 2013-09-29 LAB — GLUCOSE, CAPILLARY
Glucose-Capillary: 215 mg/dL — ABNORMAL HIGH (ref 70–99)
Glucose-Capillary: 205 mg/dL — ABNORMAL HIGH (ref 70–99)
Glucose-Capillary: 196 mg/dL — ABNORMAL HIGH (ref 70–99)
Glucose-Capillary: 161 mg/dL — ABNORMAL HIGH (ref 70–99)

## 2013-09-29 LAB — PROCALCITONIN: Procalcitonin: <0.1 ng/mL

## 2013-09-29 LAB — CULTURE, GROUP A STREP

## 2013-09-29 LAB — HEMOGLOBIN AND HEMATOCRIT, BLOOD
HCT: 21.9 % — ABNORMAL LOW (ref 36.0–46.0)
Hemoglobin: 7.3 g/dL — ABNORMAL LOW (ref 12.0–15.0)

## 2013-09-29 LAB — FERRITIN: Ferritin: 33 ng/mL (ref 10–291)

## 2013-09-29 LAB — RETICULOCYTES
RBC.: 2.57 MIL/uL — ABNORMAL LOW (ref 3.87–5.11)
Retic Count, Absolute: 15.4 10*3/uL — ABNORMAL LOW (ref 19.0–186.0)
Retic Ct Pct: 0.6 % (ref 0.4–3.1)

## 2013-09-29 LAB — HEMOGLOBIN A1C
Hgb A1c MFr Bld: 6.7 % — ABNORMAL HIGH (ref <5.7)
Mean Plasma Glucose: 146 mg/dL — ABNORMAL HIGH (ref <117)

## 2013-09-29 LAB — PREPARE RBC (CROSSMATCH)

## 2013-09-29 MED ORDER — ACETAMINOPHEN 325 MG PO TABS
650.0000 mg | ORAL_TABLET | Freq: Four times a day (QID) | ORAL | Status: DC | PRN
  Administered 2013-09-29 – 2013-10-02 (×2): 650 mg via ORAL
  Filled 2013-09-29 (×2): qty 2

## 2013-09-29 MED ORDER — SODIUM CHLORIDE 0.9 % IV SOLN
Freq: Once | INTRAVENOUS | Status: AC
Start: 2013-09-29 — End: 2013-09-29
  Administered 2013-09-29: 14:00:00 via INTRAVENOUS

## 2013-09-29 MED ORDER — LEVOFLOXACIN IN D5W 750 MG/150ML IV SOLN
750.0000 mg | INTRAVENOUS | Status: AC
  Administered 2013-09-29: 750 mg via INTRAVENOUS
  Filled 2013-09-29: qty 150

## 2013-09-29 MED ORDER — IPRATROPIUM-ALBUTEROL 0.5-2.5 (3) MG/3ML IN SOLN: 3.0000 mL | Freq: Four times a day (QID) | RESPIRATORY_TRACT | Status: DC | PRN

## 2013-09-29 MED ORDER — SODIUM CHLORIDE 0.9 % IV SOLN: Freq: Once | INTRAVENOUS | Status: AC

## 2013-09-29 MED ORDER — FUROSEMIDE 10 MG/ML IJ SOLN: 10.0000 mg | Freq: Once | INTRAMUSCULAR | Status: DC

## 2013-09-29 NOTE — Progress Notes (Signed)
Bipap not to be worn tonigt. Left in room in case needed. RT will continue to monitor.

## 2013-09-29 NOTE — Progress Notes (Signed)
[facility] TEAM 1 - Stepdown/ICU TEAM Progress Note  Jodi Bell [id number] DOB: 1960/12/25 DOA: 09/27/2013 PCP: Default, Provider, MD  Admit HPI / Brief Narrative: 53 yo BF PMHx Asthma, DM, HTN, psoriatic arthritis, eczema on methotrexate and chronic prednisone, chronic kidney disease.  Presented 8/18 with fever, myalgias, tremors and SOB. Admitted by [facility] with suspected PNA, acute renal failure and ?serotonin syndrome. Had worsening SOB and mental status, started on bipap and PCCM consulted. Per her Epic EMR appears that Dr. Vassie Loll is her Rheumatologist at [facility]   HPI/Subjective: 8/20 patient complains of DOE/lightheadedness when she stands. States her normal hemoglobin is 9. States had an EGD and colonoscopy at [facility] in 2014 and was told to return in 10 years  Assessment/Plan: Acute hypercarbic/ hypoxic respiratory failure  - unclear etiology. Suspect some degree OHS and ?OSA +/- developing PNA, and anemia. -Resolved however patient has not ambulated yet -Obtain orthostatic vitals and ambulatory SpO2 after patient receives blood transfusion  Asthma  -Continue DuoNeb QID PRN -Continue Singulair 10 mg daily - Hypotension  -Resolved -Start Zebeta 10 mg daily (home dose 20 mg daily) in the a.m. -Restart amlodipine 10 mg daily first dose a.m.  HTN  -See hypertension  -Echocardiogram pending    Acute on chronic renal failure  - suspect multifactorial r/t dehydration, ACE, NSAIDs  -Continue normal saline at 14ml/hr; improving -Baseline creatinine somewhere between 1.34 (taken 09/2011) 2..5 (taken 09/2013); current creatinine 1.61  Hyponatremia - mild  -Improving cont volume resuscitation -Recheck in a.m.   Symptomatic Anemia  -Guaiac stools -Transfuse 2 units PRBC  CAP PNA (immunocompromised) -Patient on methotrexate and prednisone chronically - no obvious infiltrate but suspect may fluff out once she is rehydrated -Complete 7 day course of antibiotics,  transition to levofloxacin PO 750 mg daily   Psoriatic arthritis/psoriasis  -Immunocompromised - on chronic methotrexate and prednisone -Hold methotrexate, continue stress dose steroids Solu-Cortef 50 mg QID  Fever  - Resolved patient has been afebrile     Relative adrenal insufficiency - chronic prednisone  -Continue stress dose steroids Solu-Cortef 50 mg QID  DM  -Obtain hemoglobin A1c  -Sensitive SSI   AMS  - multifactorial r/t hypercarbia, hypotension/SIRS exacerbated by Ativan admin - Resolved  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      Code Status: FULL Family Communication: no family present at time of exam Disposition Plan: Discharge next 24-48 hours    Consultants: Dr Jennet Maduro (PCCM)    Procedure/Significant Events: 8/19 admit, bipap, persistent hypercarbia  8./19 at CT without contrast  Mild chronic ischemic white matter disease. No acute intracranial  abnormality seen. 8/19 CXR;Stable cardiac enlargement    Culture 8/19 BCx2 left hand x2 NGTD  8/19 Culture group A strep NGTD 8/19 MRSA by PCR negative 8/19 respiratory virus panel pending  Antibiotics: Vancomycin 8/19>> stopped 8/20 Zosyn 8/19>> stopped 8/20 Levofloxacin 8/20>>  DVT prophylaxis: SCDs   Devices NA   LINES / TUBES:      Continuous Infusions: . sodium chloride 125 mL/hr at 09/28/13 0916    Objective: VITAL SIGNS: Temp: 98.8 F (37.1 C) (08/20 0400) Temp src: Oral (08/20 0400) BP: 114/84 mmHg (08/20 0400) Pulse Rate: 98 (08/20 0400) SPO2; 100% on 3 L via [place] FIO2:   Intake/Output Summary (Last 24 hours) at 09/29/13 0729 Last data filed at 09/29/13 0500  Gross per 24 hour  Intake 1891.67  ml  Output   4950 ml  Net -3058.33 ml     Exam: General: A./O. x4, NAD, No acute respiratory distress Lungs: Clear to  auscultation bilaterally without wheezes or crackles Cardiovascular: Regular rate and rhythm without murmur gallop or rub normal S1 and S2 Abdomen: Nontender, nondistended, soft, bowel sounds positive, no rebound, no ascites, no appreciable mass Extremities: No significant cyanosis, clubbing, or edema bilateral lower extremities  Data Reviewed: Basic Metabolic Panel:  Recent Labs Lab 09/28/13 0040 09/28/13 1355 09/29/13 0352  NA 132* 131* 133*  K 4.0 4.3 4.4  CL 96 99 101  CO2 24 19 20   GLUCOSE 114* 121* 225*  BUN 30* 28* 24*  CREATININE 2.50* 2.13* 1.61*  CALCIUM 10.1 9.2 9.2   Liver Function Tests:  Recent Labs Lab 09/29/13 0352  AST 18  ALT 16  ALKPHOS 45  BILITOT 0.3  PROT 5.9*  ALBUMIN 2.4*   No results found for this basename: LIPASE, AMYLASE,  in the last 168 hours No results found for this basename: AMMONIA,  in the last 168 hours CBC:  Recent Labs Lab 09/28/13 0040 09/28/13 0905 09/29/13 0352  WBC 5.5 3.5* 3.7*  NEUTROABS 3.9  --   --   HGB 7.4* 7.1* 6.7*  HCT 23.9* 22.5* 20.9*  MCV 89.5 90.0 87.8  PLT 235 186 184   Cardiac Enzymes:  Recent Labs Lab 09/28/13 0040  TROPONINI <0.30   BNP (last 3 results) No results found for this basename: PROBNP,  in the last 8760 hours CBG:  Recent Labs Lab 09/28/13 0740 09/28/13 1242 09/28/13 2225  GLUCAP 100* 125* 246*    Recent Results (from the past 240 hour(s))  RAPID STREP SCREEN     Status: None   Collection Time    09/28/13 12:50 AM      Result Value Ref Range Status   Streptococcus, Group A Screen (Direct) NEGATIVE  NEGATIVE Final   Comment: (NOTE)     A Rapid Antigen test may result negative if the antigen level in the     sample is below the detection level of this test. The FDA has not     cleared this test as a stand-alone test therefore the rapid antigen     negative result has reflexed to a Group A Strep culture.  MRSA PCR SCREENING     Status: None   Collection Time    09/28/13   6:46 AM      Result Value Ref Range Status   MRSA by PCR NEGATIVE  NEGATIVE Final   Comment:            The GeneXpert MRSA Assay (FDA     approved for NASAL specimens     only), is one component of a     comprehensive MRSA colonization     surveillance program. It is not     intended to diagnose MRSA     infection nor to guide or     monitor treatment for     MRSA infections.     Studies:  Recent x-ray studies have been reviewed in detail by the Attending Physician  Scheduled Meds:  Scheduled Meds: . sodium chloride   Intravenous Once  . antiseptic oral rinse  7 mL Mouth Rinse q12n4p  . chlorhexidine  15 mL Mouth Rinse BID  . hydrocortisone sod succinate (SOLU-CORTEF) inj  50 mg Intravenous Q6H  . insulin aspart  0-9 Units Subcutaneous TID WC  . ipratropium-albuterol  3 mL  Nebulization Q6H  . montelukast  10 mg Oral QHS  . piperacillin-tazobactam (ZOSYN)  IV  3.375 g Intravenous 3 times per day  . vancomycin  1,500 mg Intravenous Q24H    Time spent on care of this patient: 40 mins   Allie Bossier , MD   [facility] Office  [phone] Pager [phone]  On-Call/Text Page:      [email or web address]      password [email or web address]  If 7PM-7AM, please contact night-coverage [email or web address] Password [email or web address] 09/29/2013, 7:29 AM   LOS: 2 days

## 2013-09-29 NOTE — Progress Notes (Signed)
Patient Jodi Bell was on 170's, asymptomatic. Dr. Sherral Hammers paged, awaiting for new orders.

## 2013-09-29 NOTE — Progress Notes (Signed)
PULMONARY / CRITICAL CARE MEDICINE   Name: Jodi Bell MRN: [id number] DOB: [birth year]    ADMISSION DATE:  09/27/2013 CONSULTATION DATE:  09/28/13  REFERRING MD :  Tyrell Antonio   CHIEF COMPLAINT:  Respiratory failure   INITIAL PRESENTATION: 53 yo female with hx asthma, DM, HTN, psoriatic arthritis on methotrexate and chronic prednisone presented 8/18 with fever, myalgias, tremors and SOB.  Admitted by [facility] with suspected PNA, acute renal failure and ?serotonin syndrome.  Had worsening SOB and mental status, started on bipap and PCCM consulted.   STUDIES:  8/19 CT head: chronic ischemic white matter dz, no acute findings  8/19 bilateral LE dopplers negative   SIGNIFICANT EVENTS: 8/19 admit, bipap, persistent hypercarbia. BIPAP adjusted w/ improved WOB and MS. PCO2 improved.  8/20: hgb drifting 7.1-->6.7. Renal fxn improved. PCO2 improved. Fully awake and oriented   SUBJECTIVE:   VITAL SIGNS: Temp:  [96.8 F (36 C)-99.1 F (37.3 C)] 98.8 F (37.1 C) (08/20 0400) Pulse Rate:  [74-98] 98 (08/20 0400) Resp:  [10-22] 22 (08/20 0400) BP: (99-132)/(54-84) 114/84 mmHg (08/20 0400) SpO2:  [97 %-100 %] 100 % (08/20 0400) FiO2 (%):  [35 %] 35 % (08/19 1237) HEMODYNAMICS:   VENTILATOR SETTINGS: Vent Mode:  [-]  FiO2 (%):  [35 %] 35 % INTAKE / OUTPUT:  Intake/Output Summary (Last 24 hours) at 09/29/13 0819 Last data filed at 09/29/13 0700  Gross per 24 hour  Intake 2966.67 ml  Output   4950 ml  Net -1983.33 ml    PHYSICAL EXAMINATION: General:  Obese female, NAD, fully awake and oriented  Neuro:  No focal def/ Fully awake and oriented HEENT:  Mm dry, no JVD  Cardiovascular:  s1s2 rrr, NSR 70's  Lungs:  resps even, non labored, CTA  Abdomen:  Round, soft, non tender, +bs Musculoskeletal:  Warm and dry, 1+ BLE edema, NEG clonus   LABS:  CBC  Recent Labs Lab 09/28/13 0040 09/28/13 0905 09/29/13 0352  WBC 5.5 3.5* 3.7*  HGB 7.4* 7.1* 6.7*  HCT 23.9* 22.5* 20.9*   PLT 235 186 184   Coag's No results found for this basename: APTT, INR,  in the last 168 hours BMET  Recent Labs Lab 09/28/13 0040 09/28/13 1355 09/29/13 0352  NA 132* 131* 133*  K 4.0 4.3 4.4  CL 96 99 101  CO2 24 19 20   BUN 30* 28* 24*  CREATININE 2.50* 2.13* 1.61*  GLUCOSE 114* 121* 225*   Electrolytes  Recent Labs Lab 09/28/13 0040 09/28/13 1355 09/29/13 0352  CALCIUM 10.1 9.2 9.2   Sepsis Markers  Recent Labs Lab 09/28/13 0109 09/28/13 0905 09/28/13 1355 09/29/13 0352  LATICACIDVEN 0.77 0.5  --   --   PROCALCITON  --   --  <0.10 <0.10   ABG  Recent Labs Lab 09/28/13 0700 09/28/13 0905 09/29/13 0357  PHART 7.264* 7.282* 7.327*  PCO2ART 51.9* 47.8* 42.5  PO2ART 107.0* 61.5* 107.0*   Liver Enzymes  Recent Labs Lab 09/29/13 0352  AST 18  ALT 16  ALKPHOS 45  BILITOT 0.3  ALBUMIN 2.4*   Cardiac Enzymes  Recent Labs Lab 09/28/13 0040  TROPONINI <0.30   Glucose  Recent Labs Lab 09/28/13 0740 09/28/13 1242 09/28/13 2225  GLUCAP 100* 125* 246*    Imaging Ct Head Wo Contrast  09/28/2013   CLINICAL DATA:  Altered mental status.  EXAM: CT HEAD WITHOUT CONTRAST  TECHNIQUE: Contiguous axial images were obtained from the base of the skull through the vertex without intravenous  contrast.  COMPARISON:  None.  FINDINGS: Bony calvarium appears intact. No mass effect or midline shift is noted. Ventricular size is within normal limits. There is no evidence of mass lesion, hemorrhage or acute infarction. Mild chronic ischemic white matter disease is noted.  IMPRESSION: Mild chronic ischemic white matter disease. No acute intracranial abnormality seen.   Electronically Signed   By: Sabino Dick M.D.   On: 09/28/2013 10:27     ASSESSMENT / PLAN:  PULMONARY Acute hypercarbic/ hypoxic respiratory failure - unclear etiology.  Suspect some degree OHS and ?OSA +/- developing PNA.  Hx asthma  P:   BIPAP PRN Pulse ox  F/u CXR  f/u RVP -  immunosuppresed  BD's scheduled  Wean FIO2   CARDIOVASCULAR Hypotension  Hx HTN  P:  Taper stress dose steroids  If remains stable would consider adding back antihypertensives in am 8/21 Maintain euvolemia    RENAL Acute renal failure - suspect multifactorial r/t dehydration, ACE, NSAIDs-->improved  Hyponatremia - mild  P:   Maintain euvolemia  Avoid hypotension  F/u chem   GASTROINTESTINAL No active issue  P:   NPO for now   HEMATOLOGIC Anemia  P:  F/u cbc  SQ heparin  Defer to IM service   INFECTIOUS ?PNA - no obvious infiltrate but suspect may fluff out once she is rehydrated  Immunocompromised - on chronic methotrexate  Fever - on immunosuppression so may not develop leukocytosis  P:   BCx2 8/19>>> UA - neg  Vanc 8/19>>> Zosyn 8/19>>> RVP 8/19>>> Follow culture data  abx as above  Hold methotrexate for now  Check RVP  Check pct   ENDOCRINE Relative adrenal insufficiency - chronic prednisone  DM P:   SSI  Stress steroids --.begin taper   NEUROLOGIC AMS - multifactorial r/t hypercarbia, hypotension/SIRS exacerbated by Ativan admin  ?seratonin syndrome - recently started on lexapro, presented with reports of "tremors, spasms" but otherwise no s/s - not tachycardic, hypertensive, no clonus, etc.  P:   Avoid oversedation  Supportive care   TODAY'S SUMMARY:   53yo with fever unclear etiology, resp failure, AMS, renal failure. Improved w/ NIPPV, ABX and hydration. We will s/o   Patient seen and examined, agree with above note.  I dictated the care and orders written for this patient under my direction.  Rush Farmer, MD [phone]

## 2013-09-29 NOTE — Progress Notes (Signed)
ANTIBIOTIC CONSULT NOTE - INITIAL  Pharmacy Consult for Levaquin Indication: pneumonia  Allergies  Allergen Reactions  . Lexapro [Escitalopram Oxalate]     Hand problems     Patient Measurements: Height: 5\' 2"  (157.5 cm) Weight: 237 lb 14 oz (107.9 kg) IBW/kg (Calculated) : 50.1 Adjusted Body Weight:    Vital Signs: Temp: 98.2 F (36.8 C) (08/20 1213) Temp src: Oral (08/20 1213) BP: 145/74 mmHg (08/20 1213) Pulse Rate: 99 (08/20 1213) Intake/Output from previous day: 08/19 0701 - 08/20 0700 In: 4016.7 [P.O.:200; I.V.:3216.7; IV Piggyback:600] Out: 4950 [Urine:4950] Intake/Output from this shift: Total I/O In: 2370 [P.O.:720; I.V.:1000; IV Piggyback:650] Out: 4250 [Urine:4250]  Labs:  Recent Labs  09/28/13 0040 09/28/13 0905 09/28/13 1355 09/29/13 0352 09/29/13 0830  WBC 5.5 3.5*  --  3.7*  --   HGB 7.4* 7.1*  --  6.7* 7.3*  PLT 235 186  --  184  --   CREATININE 2.50*  --  2.13* 1.61*  --    Estimated Creatinine Clearance: 46.7 ml/min (by C-G formula based on Cr of 1.61). No results found for this basename: VANCOTROUGH, VANCOPEAK, VANCORANDOM, GENTTROUGH, GENTPEAK, GENTRANDOM, TOBRATROUGH, TOBRAPEAK, TOBRARND, AMIKACINPEAK, AMIKACINTROU, AMIKACIN,  in the last 72 hours   Microbiology:  Medical History: Past Medical History  Diagnosis Date  . Essential hypertension   . Eczema   . Asthma   . Diabetes mellitus   . Chronic insomnia   . Arthritis   . Candida infection   . GERD (gastroesophageal reflux disease)      Assessment: 53 y/o immunocompromised is to start Levaquin for CAP. Has received 1 day of treatment with Vanco/Zosyn. Patient is currently afebrile. WBC 3.7. CrCl 46.  Goal of Therapy:  Narrow abs to Levaquin for PNA  Plan:  Levaquin 750mg  IV q48h  Bettina Warn S. Alford Highland, PharmD, BCPS Clinical Staff Pharmacist Pager [phone]  Eilene Ghazi Stillinger 09/29/2013,3:42 PM

## 2013-09-30 DIAGNOSIS — R55 Syncope and collapse: Secondary | ICD-10-CM

## 2013-09-30 DIAGNOSIS — I517 Cardiomegaly: Secondary | ICD-10-CM

## 2013-09-30 LAB — GLUCOSE, CAPILLARY
Glucose-Capillary: 197 mg/dL — ABNORMAL HIGH (ref 70–99)
Glucose-Capillary: 203 mg/dL — ABNORMAL HIGH (ref 70–99)
Glucose-Capillary: 202 mg/dL — ABNORMAL HIGH (ref 70–99)
Glucose-Capillary: 130 mg/dL — ABNORMAL HIGH (ref 70–99)

## 2013-09-30 LAB — TYPE AND SCREEN
ABO/RH(D): AB POS
Antibody Screen: NEGATIVE
Unit division: 0
Unit division: 0

## 2013-09-30 LAB — RESPIRATORY VIRUS PANEL
Adenovirus: NOT DETECTED
Influenza A H1: NOT DETECTED
Influenza A H3: NOT DETECTED
Influenza A: NOT DETECTED
Influenza B: NOT DETECTED
Metapneumovirus: NOT DETECTED
Parainfluenza 1: NOT DETECTED
Parainfluenza 2: NOT DETECTED
Parainfluenza 3: NOT DETECTED
Respiratory Syncytial Virus A: NOT DETECTED
Respiratory Syncytial Virus B: NOT DETECTED
Rhinovirus: NOT DETECTED

## 2013-09-30 LAB — HEMOGLOBIN AND HEMATOCRIT, BLOOD
HCT: 30.2 % — ABNORMAL LOW (ref 36.0–46.0)
Hemoglobin: 9.8 g/dL — ABNORMAL LOW (ref 12.0–15.0)

## 2013-09-30 LAB — PROCALCITONIN: Procalcitonin: <0.1 ng/mL

## 2013-09-30 MED ORDER — HYDROCORTISONE NA SUCCINATE PF 100 MG IJ SOLR
50.0000 mg | Freq: Two times a day (BID) | INTRAMUSCULAR | Status: DC
  Administered 2013-09-30 – 2013-10-01 (×2): 50 mg via INTRAVENOUS
  Filled 2013-09-30 (×3): qty 1

## 2013-09-30 MED ORDER — FOLIC ACID 1 MG PO TABS
1.0000 mg | ORAL_TABLET | Freq: Every day | ORAL | Status: DC
  Administered 2013-09-30 – 2013-10-03 (×4): 1 mg via ORAL
  Filled 2013-09-30 (×4): qty 1

## 2013-09-30 MED ORDER — CYCLOBENZAPRINE HCL 10 MG PO TABS: 10.0000 mg | ORAL_TABLET | Freq: Three times a day (TID) | ORAL | Status: DC | PRN

## 2013-09-30 MED ORDER — FERROUS SULFATE 325 (65 FE) MG PO TABS
325.0000 mg | ORAL_TABLET | Freq: Every day | ORAL | Status: DC
  Administered 2013-10-01 – 2013-10-03 (×3): 325 mg via ORAL
  Filled 2013-09-30 (×4): qty 1

## 2013-09-30 MED ORDER — DICLOFENAC SODIUM 1 % TD GEL
2.0000 g | Freq: Four times a day (QID) | TRANSDERMAL | Status: DC | PRN
  Administered 2013-10-01: 2 g via TOPICAL
  Filled 2013-09-30 (×2): qty 100

## 2013-09-30 MED ORDER — LEVOFLOXACIN 750 MG PO TABS
750.0000 mg | ORAL_TABLET | ORAL | Status: DC
  Filled 2013-09-30: qty 1

## 2013-09-30 MED ORDER — HYDROXYZINE PAMOATE 25 MG PO CAPS
25.0000 mg | ORAL_CAPSULE | Freq: Four times a day (QID) | ORAL | Status: DC | PRN
  Filled 2013-09-30 (×2): qty 1

## 2013-09-30 MED ORDER — PANTOPRAZOLE SODIUM 40 MG PO TBEC
40.0000 mg | DELAYED_RELEASE_TABLET | Freq: Every day | ORAL | Status: DC
Start: 2013-09-30 — End: 2013-10-03
  Administered 2013-09-30 – 2013-10-03 (×4): 40 mg via ORAL
  Filled 2013-09-30 (×4): qty 1

## 2013-09-30 MED ORDER — AMLODIPINE BESYLATE 10 MG PO TABS
10.0000 mg | ORAL_TABLET | Freq: Every day | ORAL | Status: DC
  Administered 2013-09-30 – 2013-10-03 (×4): 10 mg via ORAL
  Filled 2013-09-30 (×4): qty 1

## 2013-09-30 MED ORDER — DICLOFENAC SODIUM 1 % TD GEL: 2.0000 g | Freq: Two times a day (BID) | TRANSDERMAL | Status: DC | PRN

## 2013-09-30 NOTE — Progress Notes (Signed)
Patient trasfered from [facility] to 5W10 via wheelchair; alert and oriented x 4; no complaints of pain; IV saline locked in RAC and fluids running in LFA; skin intact. Orient patient to room and unit; instructed how to use the call bell and  fall risk precautions. Will continue to monitor the patient.

## 2013-09-30 NOTE — Progress Notes (Signed)
Inpatient Diabetes Program Recommendations  AACE/ADA: New Consensus Statement on Inpatient Glycemic Control (2013)  Target Ranges:  Prepandial:   less than 140 mg/dL      Peak postprandial:   less than 180 mg/dL (1-2 hours)      Critically ill patients:  140 - 180 mg/dL   Results for Jodi, [name] (MRN [id number]) as of 09/30/2013 08:20  Ref. Range 09/29/2013 08:22 09/29/2013 12:12 09/29/2013 17:20 09/29/2013 21:26  Glucose-Capillary Latest Range: 70-99 mg/dL 215 (H) 205 (H) 196 (H) 161 (H)   Diabetes history: DM2 Outpatient Diabetes medications: Glyburide 7.5 mg QAM, Glyburide 2.5 mg QPM, Metformin 1000 mg BID Current orders for Inpatient glycemic control: Novolog 0-9 units AC  Inpatient Diabetes Program Recommendations Insulin - Meal Coverage: While inpatient and ordered steroids, please consider ordering Novolog 4 units TID with meals for meal coverage if patient eats at least 50% of meal.  Thanks, Barnie Alderman, RN, MSN, [place] Diabetes Coordinator Inpatient Diabetes Program [phone] (Team Pager) [phone] ([facility] office) [phone] [facility] office)

## 2013-09-30 NOTE — Progress Notes (Signed)
[place] TEAM 1 - Stepdown/ICU TEAM Progress Note  Jodi [name] [id number] DOB: 1960/06/24 DOA: 09/27/2013 PCP: Default, Provider, MD  Admit HPI / Brief Narrative: 53 yo F w/ Hx Asthma, DM, HTN, psoriatic arthritis, eczema on methotrexate and chronic prednisone, and chronic kidney disease who presented 8/18 with fever, myalgias, tremors and SOB. Admitted by [facility] with suspected PNA, acute renal failure and ?serotonin syndrome. Had worsening SOB and mental status necessitationg bipap and PCCM consult.  Per EMR appears that Dr. Vassie Loll is her Rheumatologist at [facility].  After admit, her course has been complicated by the development of anemia in the absence of obvious blood loss, as well as renal insufficiency, and hypotension.  She has begun to stabilize clinically w/ empiric tx for CAP, but we need to see her renal function improve further, assure that her Hgb remains stable, and begin to progress activity/ambulate her.    Significant Events: 8/19 admit, bipap, persistent hypercarbia  8/19 CT head without contrast - Mild chronic ischemic white matter disease. No acute intracranial  abnormality seen. 8/19 CXR - stable cardiac enlargement  HPI/Subjective: Denies new complaints.  Asks for her home meds to be resumed.  Denies hematochezia, melena, or hematemesis.  Denies cp, n/v, or abdom pain.  No sob at rest, but has not yet ambulated significantly.    Assessment/Plan:  Acute hypercarbic/ hypoxic respiratory failure -unclear etiology - suspect some degree OHS/OSA +/- developing PNA, and anemia -resolved at rest, but will need to monitor sats w/ ambulation   Asthma  -Continue DuoNeb QID PRN -Continue Singulair 10 mg daily  Hypotension  -Resolved  HTN  -recurring - resume home BP meds  -Echocardiogram pending    Acute on chronic renal failure  -suspect multifactorial r/t dehydration, ACE, NSAIDs - crt peaked at 2.5 -Baseline creatinine somewhere between 1.34 (taken  09/2011) & 2.5 (taken 09/2013) -crt improving w/ hydration - recheck in AM   Hyponatremia - mild  -Improving cont volume resuscitation - recheck in AM   Symptomatic Anemia - unclear etiology   -Transfused 2 units PRBC - Hgb 9.8 s/p transfusion - pt denies obvious blood loss - Fe studies not c/w chronic Fe deficiency - perhaps this is "anemia of chronic disease" related to psoriasis - follow Hgb trend - Hgb 9.6 per CBC at [facility] April 2015  CAP (immunocompromised) -Patient on methotrexate and prednisone chronically -no obvious infiltrate but suspect may fluff out once she is rehydrated - recheck CXR in AM  -complete 7 day course of antibiotics, transition to levofloxacin PO 750 mg daily   Psoriatic arthritis/psoriasis  -Immunocompromised on chronic methotrexate and prednisone -Hold methotrexate, continue stress dose steroids but initiate taper back toward baseline prednisone dosing    Relative adrenal insufficiency - chronic prednisone  -Continue stress dose steroids on taper   DM  -A1c 6.7 - CBG reasonably controlled at present   AMS  -multifactorial r/t hypercarbia, hypotension/SIRS exacerbated by Ativan admin -resolved - pt alert and oriented today   Possible Seratonin syndrome - unlikely -recently started on lexapro, presented with reports of "tremors, spasms" but otherwise no s/s - not tachycardic, hypertensive, no clonus, etc.   Code Status: FULL Family Communication: no family present at time of exam Disposition Plan: Discharge next 24-48 hours  Consultants: PCCM  Antibiotics: Rocephin 8/18 Azithro 8/18 Vancomycin 8/19 >> 8/20 Zosyn 8/19 >> 8/20 Levofloxacin 8/20 >>  DVT prophylaxis: SCDs  Objective: VITAL SIGNS: Temp: 98 F (36.7 C) (08/21 1141) Temp src: Oral (08/21 1141)  BP: 167/88 mmHg (08/21 1141) Pulse Rate: 85 (08/21 1141)  Intake/Output Summary (Last 24 hours) at 09/30/13 1424 Last data filed at 09/30/13 1322  Gross per 24 hour  Intake  4425.5 ml  Output   3550 ml  Net  875.5 ml   Exam: General: No acute respiratory distress at rest in bed  Lungs: Clear to auscultation bilaterally without wheezes or crackles appreciable  Cardiovascular: Regular rate and rhythm without murmur gallop or rub normal S1 and S2 Abdomen: Nontender, nondistended, soft, bowel sounds positive, no rebound, no ascites, no appreciable mass Extremities: No significant cyanosis, clubbing, or edema bilateral lower extremities  Data Reviewed: Basic Metabolic Panel:  Recent Labs Lab 09/28/13 0040 09/28/13 1355 09/29/13 0352  NA 132* 131* 133*  K 4.0 4.3 4.4  CL 96 99 101  CO2 24 19 20   GLUCOSE 114* 121* 225*  BUN 30* 28* 24*  CREATININE 2.50* 2.13* 1.61*  CALCIUM 10.1 9.2 9.2   Liver Function Tests:  Recent Labs Lab 09/29/13 0352  AST 18  ALT 16  ALKPHOS 45  BILITOT 0.3  PROT 5.9*  ALBUMIN 2.4*   CBC:  Recent Labs Lab 09/28/13 0040 09/28/13 0905 09/29/13 0352 09/29/13 0830 09/30/13 0932  WBC 5.5 3.5* 3.7*  --   --   NEUTROABS 3.9  --   --   --   --   HGB 7.4* 7.1* 6.7* 7.3* 9.8*  HCT 23.9* 22.5* 20.9* 21.9* 30.2*  MCV 89.5 90.0 87.8  --   --   PLT 235 186 184  --   --    Cardiac Enzymes:  Recent Labs Lab 09/28/13 0040  TROPONINI <0.30   CBG:  Recent Labs Lab 09/29/13 1212 09/29/13 1720 09/29/13 2126 09/30/13 0830 09/30/13 1158  GLUCAP 205* 196* 161* 197* 203*    Recent Results (from the past 240 hour(s))  RAPID STREP SCREEN     Status: None   Collection Time    09/28/13 12:50 AM      Result Value Ref Range Status   Streptococcus, Group A Screen (Direct) NEGATIVE  NEGATIVE Final   Comment: (NOTE)     A Rapid Antigen test may result negative if the antigen level in the     sample is below the detection level of this test. The FDA has not     cleared this test as a stand-alone test therefore the rapid antigen     negative result has reflexed to a Group A Strep culture.  CULTURE, GROUP A STREP      Status: None   Collection Time    09/28/13 12:50 AM      Result Value Ref Range Status   Specimen Description THROAT   Final   Special Requests NONE   Final   Culture     Final   Value: No Beta Hemolytic Streptococci Isolated     Performed at [facility]   Report Status 09/29/2013 FINAL   Final  MRSA PCR SCREENING     Status: None   Collection Time    09/28/13  6:46 AM      Result Value Ref Range Status   MRSA by PCR NEGATIVE  NEGATIVE Final   Comment:            The GeneXpert MRSA Assay (FDA     approved for NASAL specimens     only), is one component of a     comprehensive MRSA colonization     surveillance program. It  is not     intended to diagnose MRSA     infection nor to guide or     monitor treatment for     MRSA infections.  CULTURE, BLOOD (ROUTINE X 2)     Status: None   Collection Time    09/28/13 11:45 AM      Result Value Ref Range Status   Specimen Description BLOOD LEFT HAND   Final   Special Requests BOTTLES DRAWN AEROBIC AND ANAEROBIC 10CC   Final   Culture  Setup Time     Final   Value: 09/28/2013 17:13     Performed at [facility]   Culture     Final   Value:        BLOOD CULTURE RECEIVED NO GROWTH TO DATE CULTURE WILL BE HELD FOR 5 DAYS BEFORE ISSUING A FINAL NEGATIVE REPORT     Performed at [facility]   Report Status PENDING   Incomplete  CULTURE, BLOOD (ROUTINE X 2)     Status: None   Collection Time    09/28/13 12:00 PM      Result Value Ref Range Status   Specimen Description BLOOD LEFT HAND   Final   Special Requests BOTTLES DRAWN AEROBIC ONLY 10CC   Final   Culture  Setup Time     Final   Value: 09/28/2013 17:14     Performed at [facility]   Culture     Final   Value:        BLOOD CULTURE RECEIVED NO GROWTH TO DATE CULTURE WILL BE HELD FOR 5 DAYS BEFORE ISSUING A FINAL NEGATIVE REPORT     Performed at [facility]   Report Status PENDING   Incomplete     Studies:  Recent x-ray studies have  been reviewed in detail by the Attending Physician  Scheduled Meds:  Scheduled Meds: . antiseptic oral rinse  7 mL Mouth Rinse q12n4p  . chlorhexidine  15 mL Mouth Rinse BID  . hydrocortisone sod succinate (SOLU-CORTEF) inj  50 mg Intravenous Q6H  . insulin aspart  0-9 Units Subcutaneous TID WC  . levofloxacin (LEVAQUIN) IV  750 mg Intravenous Q48H  . montelukast  10 mg Oral QHS    Time spent on care of this patient: 35 mins  Cherene Altes, MD [facility] For Consults/Admissions - Flow Manager - [phone] Office  [phone] Pager [phone]  On-Call/Text Page:      [email or web address]      password [facility]  09/30/2013, 2:24 PM   LOS: 3 days

## 2013-09-30 NOTE — Progress Notes (Signed)
*  PRELIMINARY RESULTS* Echocardiogram 2D Echocardiogram has been performed.  Jodi [name] 09/30/2013, 3:19 PM

## 2013-10-01 ENCOUNTER — Inpatient Hospital Stay (HOSPITAL_COMMUNITY): Payer: Medicare PPO

## 2013-10-01 DIAGNOSIS — E876 Hypokalemia: Secondary | ICD-10-CM

## 2013-10-01 LAB — BASIC METABOLIC PANEL
Anion gap: 15 (ref 5–15)
Anion gap: 14 (ref 5–15)
BUN: 8 mg/dL (ref 6–23)
BUN: 10 mg/dL (ref 6–23)
CO2: 27 mEq/L (ref 19–32)
CO2: 27 mEq/L (ref 19–32)
Calcium: 9.5 mg/dL (ref 8.4–10.5)
Calcium: 9.3 mg/dL (ref 8.4–10.5)
Chloride: 99 mEq/L (ref 96–112)
Chloride: 98 mEq/L (ref 96–112)
Creatinine, Ser: 0.9 mg/dL (ref 0.50–1.10)
Creatinine, Ser: 1.11 mg/dL — ABNORMAL HIGH (ref 0.50–1.10)
GFR calc Af Amer: 83 mL/min — ABNORMAL LOW (ref >90)
GFR calc Af Amer: 65 mL/min — ABNORMAL LOW (ref >90)
GFR calc non Af Amer: 72 mL/min — ABNORMAL LOW (ref >90)
GFR calc non Af Amer: 56 mL/min — ABNORMAL LOW (ref >90)
Glucose, Bld: 170 mg/dL — ABNORMAL HIGH (ref 70–99)
Glucose, Bld: 144 mg/dL — ABNORMAL HIGH (ref 70–99)
Potassium: 2.3 mEq/L — CL (ref 3.7–5.3)
Potassium: 2.5 mEq/L — CL (ref 3.7–5.3)
Sodium: 141 mEq/L (ref 137–147)
Sodium: 139 mEq/L (ref 137–147)

## 2013-10-01 LAB — OCCULT BLOOD X 1 CARD TO LAB, STOOL: Fecal Occult Bld: POSITIVE — AB

## 2013-10-01 LAB — CBC
HCT: 31.1 % — ABNORMAL LOW (ref 36.0–46.0)
Hemoglobin: 10.5 g/dL — ABNORMAL LOW (ref 12.0–15.0)
MCH: 28.2 pg (ref 26.0–34.0)
MCHC: 33.8 g/dL (ref 30.0–36.0)
MCV: 83.6 fL (ref 78.0–100.0)
Platelets: 144 10*3/uL — ABNORMAL LOW (ref 150–400)
RBC: 3.72 MIL/uL — ABNORMAL LOW (ref 3.87–5.11)
RDW: 19 % — ABNORMAL HIGH (ref 11.5–15.5)
WBC: 4.2 10*3/uL (ref 4.0–10.5)

## 2013-10-01 LAB — GLUCOSE, CAPILLARY
Glucose-Capillary: 159 mg/dL — ABNORMAL HIGH (ref 70–99)
Glucose-Capillary: 168 mg/dL — ABNORMAL HIGH (ref 70–99)
Glucose-Capillary: 194 mg/dL — ABNORMAL HIGH (ref 70–99)
Glucose-Capillary: 143 mg/dL — ABNORMAL HIGH (ref 70–99)

## 2013-10-01 LAB — MAGNESIUM: Magnesium: 0.5 mg/dL — CL (ref 1.5–2.5)

## 2013-10-01 MED ORDER — POTASSIUM CHLORIDE 10 MEQ/100ML IV SOLN
10.0000 meq | INTRAVENOUS | Status: AC
  Administered 2013-10-01 (×4): 10 meq via INTRAVENOUS
  Filled 2013-10-01 (×4): qty 100

## 2013-10-01 MED ORDER — DEXTROSE 5 % IV SOLN
5.0000 g | Freq: Once | INTRAVENOUS | Status: AC
  Administered 2013-10-01: 5 g via INTRAVENOUS
  Filled 2013-10-01: qty 10

## 2013-10-01 MED ORDER — HYDROCORTISONE NA SUCCINATE PF 100 MG IJ SOLR
25.0000 mg | Freq: Two times a day (BID) | INTRAMUSCULAR | Status: DC
  Administered 2013-10-01 – 2013-10-03 (×4): 25 mg via INTRAVENOUS
  Filled 2013-10-01 (×5): qty 0.5

## 2013-10-01 MED ORDER — ZOLPIDEM TARTRATE 5 MG PO TABS
5.0000 mg | ORAL_TABLET | Freq: Every evening | ORAL | Status: DC | PRN
  Administered 2013-10-02: 5 mg via ORAL
  Filled 2013-10-01: qty 1

## 2013-10-01 MED ORDER — POTASSIUM CHLORIDE CRYS ER 20 MEQ PO TBCR
40.0000 meq | EXTENDED_RELEASE_TABLET | Freq: Once | ORAL | Status: AC
  Administered 2013-10-01: 40 meq via ORAL
  Filled 2013-10-01: qty 2

## 2013-10-01 MED ORDER — LEVOFLOXACIN 750 MG PO TABS
750.0000 mg | ORAL_TABLET | ORAL | Status: DC
  Administered 2013-10-01 – 2013-10-02 (×2): 750 mg via ORAL
  Filled 2013-10-01 (×3): qty 1

## 2013-10-01 MED ORDER — INSULIN ASPART 100 UNIT/ML ~~LOC~~ SOLN
0.0000 [IU] | Freq: Three times a day (TID) | SUBCUTANEOUS | Status: DC
  Administered 2013-10-01 (×3): 2 [IU] via SUBCUTANEOUS
  Administered 2013-10-02: 5 [IU] via SUBCUTANEOUS
  Administered 2013-10-02 – 2013-10-03 (×3): 2 [IU] via SUBCUTANEOUS
  Administered 2013-10-03: 1 [IU] via SUBCUTANEOUS

## 2013-10-01 MED ORDER — POTASSIUM CHLORIDE 20 MEQ PO PACK: 40.0000 meq | PACK | Freq: Once | ORAL | Status: DC

## 2013-10-01 MED ORDER — HYDROXYZINE HCL 25 MG PO TABS
25.0000 mg | ORAL_TABLET | Freq: Four times a day (QID) | ORAL | Status: DC | PRN
  Administered 2013-10-01 – 2013-10-02 (×3): 25 mg via ORAL
  Filled 2013-10-01 (×4): qty 1

## 2013-10-01 MED ORDER — HYDRALAZINE HCL 20 MG/ML IJ SOLN
7.0000 mg | Freq: Once | INTRAMUSCULAR | Status: AC
  Administered 2013-10-01: 7 mg via INTRAVENOUS
  Filled 2013-10-01: qty 1

## 2013-10-01 MED ORDER — BISOPROLOL FUMARATE 10 MG PO TABS
20.0000 mg | ORAL_TABLET | Freq: Every day | ORAL | Status: DC
  Administered 2013-10-01 – 2013-10-03 (×3): 20 mg via ORAL
  Filled 2013-10-01 (×3): qty 2

## 2013-10-01 MED ORDER — POTASSIUM CHLORIDE CRYS ER 10 MEQ PO TBCR
50.0000 meq | EXTENDED_RELEASE_TABLET | Freq: Once | ORAL | Status: AC
  Administered 2013-10-01: 50 meq via ORAL
  Filled 2013-10-01: qty 1

## 2013-10-01 NOTE — Progress Notes (Signed)
CRITICAL VALUE ALERT  Critical value received:  K=2.5, Magnesium=0.5  Date of notification:  10/01/2013  Time of notification:  2220   Critical value read back: yes  Nurse who received alert:  Wylene Men   MD notified (1st page):  Dr. Sherral Hammers  Time of first page:  2222  MD notified (2nd page):   Time of second page:  Responding MD:  Dr. Sherral Hammers  Time MD responded:  2226

## 2013-10-01 NOTE — Progress Notes (Addendum)
Progress Note  Jodi Bell [name] DOB: [birth year] DOA: 09/27/2013 PCP: Jodi Bell  Admit HPI / Brief Narrative: 53 yo F w/ Hx Asthma, DM, HTN, psoriatic arthritis, eczema on methotrexate and chronic prednisone, and chronic kidney disease who presented 8/18 with fever, myalgias, tremors and SOB. Admitted by [facility] with suspected PNA, acute renal failure and ?serotonin syndrome. Had worsening SOB and mental status necessitationg bipap and PCCM consult.  Per EMR appears that Jodi Bell is her Rheumatologist at [facility].  After admit, her course has been complicated by the development of anemia in the absence of obvious blood loss, as well as renal insufficiency, and hypotension.  She has begun to stabilize clinically w/ empiric tx for CAP, but we need to see her renal function improve further, assure that her Hgb remains stable, and begin to progress activity/ambulate her.    Significant Events: 8/19 admit, bipap, persistent hypercarbia  8/19 CT head without contrast - Mild chronic ischemic white matter disease. No acute intracranial  abnormality seen. 8/19 CXR - stable cardiac enlargement  HPI/Subjective: Denies new complaints. She requests for  her home meds to be resumed. Denies sob and no CP. Also requesting for IVF to be turned off.  Assessment/Plan:  Acute hypercarbic/ hypoxic respiratory failure -unclear etiology - suspect some degree OHS/OSA +/- developing PNA, and anemia -resolved at rest, follow and O2 sats w/ ambulation in am   Asthma  -Continue DuoNeb QID PRN -Continue Singulair 10 mg daily  Hypotension  -Resolved  HTN  -recurring - resume home BP meds  -Echocardiogram pending    Acute on chronic renal failure  -suspect multifactorial r/t dehydration, ACE, NSAIDs - crt peaked at 2.5 -Baseline creatinine somewhere between 1.34 (taken 09/2011) & 2.5 (taken 09/2013) -crt improved w/ hydration - recheck in AM   Hyponatremia - mild  -Improving cont  volume resuscitation - recheck in AM   Symptomatic Anemia, guaiac+ - unclear etiology   -Transfused 2 units PRBC - Hgb 9.8 s/p transfusion - pt denies obvious blood loss - Fe studies not c/w chronic Fe deficiency - perhaps this is "anemia of chronic disease" related to psoriasis - follow Hgb trend - Hgb 9.6 per CBC at [facility] April 2015 -heme + but no gross bleeding and hgb remains stable - 10.5 today -if remains stable on recheck in am, will recommend outpt GI follow up on d/c  CAP (immunocompromised) -Patient on methotrexate and prednisone chronically -no obvious infiltrate but suspect may fluff out once she is rehydrated - recheck CXR in AM  -complete 7 day course of antibiotics, transition to levofloxacin PO 750 mg daily   Psoriatic arthritis/psoriasis  -Immunocompromised on chronic methotrexate and prednisone -Hold methotrexate, continue stress dose steroids but initiate taper back toward baseline prednisone dosing    Relative adrenal insufficiency - chronic prednisone  -Continue stress dose steroids on taper   DM  -A1c 6.7 - CBG reasonably controlled at present   AMS  -multifactorial r/t hypercarbia, hypotension/SIRS exacerbated by Ativan admin -resolved - pt alert and oriented today   Possible Seratonin syndrome - unlikely -recently started on lexapro, presented with reports of "tremors, spasms" but otherwise no s/s - not tachycardic, hypertensive, no clonus, etc.  Hypokalemia -replace k, check mg  Code Status: FULL Family Communication: no family present at time of exam Disposition Plan: Discharge next 24-48 hours  Consultants: PCCM  Antibiotics: Rocephin 8/18 Azithro 8/18 Vancomycin 8/19 >> 8/20 Zosyn 8/19 >> 8/20 Levofloxacin 8/20 >>  DVT prophylaxis: SCDs  Objective: VITAL SIGNS: Temp: 98.5 F (36.9 C) (08/22 1404) Temp src: Oral (08/22 1404) BP: 145/89 mmHg (08/22 1404) Pulse Rate: 85 (08/22 1404)  Intake/Output Summary (Last 24 hours) at  10/01/13 1618 Last data filed at 10/01/13 0816  Gross per 24 hour  Intake   1560 ml  Output    600 ml  Net    960 ml   Exam: General: No acute respiratory distress at rest in bed  Lungs: Clear to auscultation bilaterally without wheezes or crackles appreciable  Cardiovascular: Regular rate and rhythm without murmur gallop or rub normal S1 and S2 Abdomen: Nontender, nondistended, soft, bowel sounds positive, no rebound, no ascites, no appreciable mass Extremities: No significant cyanosis, clubbing, or edema bilateral lower extremities  Data Reviewed: Basic Metabolic Panel:  Recent Labs Lab 09/28/13 0040 09/28/13 1355 09/29/13 0352 10/01/13 0752  NA 132* 131* 133* 141  K 4.0 4.3 4.4 2.3*  CL 96 99 101 99  CO2 _0 GLUCOSE 114* 121* 225* 170*  BUN 30* 28* 24* 8  CREATININE 2.50* 2.13* 1.61* 0.90  CALCIUM 10.1 9.2 9.2 9.5   Liver Function Tests:  Recent Labs Lab 09/29/13 0352  AST 18  ALT 16  ALKPHOS 45  BILITOT 0.3  PROT 5.9*  ALBUMIN 2.4*   CBC:  Recent Labs Lab 09/28/13 0040 09/28/13 0905 09/29/13 0352 09/29/13 0830 09/30/13 0932 10/01/13 0752  WBC 5.5 3.5* 3.7*  --   --  4.2  NEUTROABS 3.9  --   --   --   --   --   HGB 7.4* 7.1* 6.7* 7.3* 9.8* 10.5*  HCT 23.9* 22.5* 20.9* 21.9* 30.2* 31.1*  MCV 89.5 90.0 87.8  --   --  83.6  PLT 235 186 184  --   --  144*   Cardiac Enzymes:  Recent Labs Lab 09/28/13 0040  TROPONINI <0.30   CBG:  Recent Labs Lab 09/30/13 1158 09/30/13 1743 09/30/13 2208 10/01/13 0744 10/01/13 1150  GLUCAP 203* 202* 130* 159* 168*    Recent Results (from the past 240 hour(s))  RAPID STREP SCREEN     Status: None   Collection Time    09/28/13 12:50 AM      Result Value Ref Range Status   Streptococcus, Group A Screen (Direct) NEGATIVE  NEGATIVE Final   Comment: (NOTE)     A Rapid Antigen test may result negative if the antigen level in the     sample is below the detection level of this test. The FDA has not      cleared this test as a stand-alone test therefore the rapid antigen     negative result has reflexed to a Group A Strep culture.  CULTURE, GROUP A STREP     Status: None   Collection Time    09/28/13 12:50 AM      Result Value Ref Range Status   Specimen Description THROAT   Final   Special Requests NONE   Final   Culture     Final   Value: No Beta Hemolytic Streptococci Isolated     Performed at [facility]   Report Status 09/29/2013 FINAL   Final  MRSA PCR SCREENING     Status: None   Collection Time    09/28/13  6:46 AM      Result Value Ref Range Status   MRSA by PCR NEGATIVE  NEGATIVE Final   Comment:  The GeneXpert MRSA Assay (FDA     approved for NASAL specimens     only), is one component of a     comprehensive MRSA colonization     surveillance program. It is not     intended to diagnose MRSA     infection nor to guide or     monitor treatment for     MRSA infections.  CULTURE, BLOOD (ROUTINE X 2)     Status: None   Collection Time    09/28/13 11:45 AM      Result Value Ref Range Status   Specimen Description BLOOD LEFT HAND   Final   Special Requests BOTTLES DRAWN AEROBIC AND ANAEROBIC 10CC   Final   Culture  Setup Time     Final   Value: 09/28/2013 17:13     Performed at [facility]   Culture     Final   Value:        BLOOD CULTURE RECEIVED NO GROWTH TO DATE CULTURE WILL BE HELD FOR 5 DAYS BEFORE ISSUING A FINAL NEGATIVE REPORT     Performed at [facility]   Report Status PENDING   Incomplete  CULTURE, BLOOD (ROUTINE X 2)     Status: None   Collection Time    09/28/13 12:00 PM      Result Value Ref Range Status   Specimen Description BLOOD LEFT HAND   Final   Special Requests BOTTLES DRAWN AEROBIC ONLY 10CC   Final   Culture  Setup Time     Final   Value: 09/28/2013 17:14     Performed at [facility]   Culture     Final   Value:        BLOOD CULTURE RECEIVED NO GROWTH TO DATE CULTURE WILL BE HELD FOR 5  DAYS BEFORE ISSUING A FINAL NEGATIVE REPORT     Performed at [facility]   Report Status PENDING   Incomplete  RESPIRATORY VIRUS PANEL     Status: None   Collection Time    09/28/13  1:00 PM      Result Value Ref Range Status   Source - RVPAN NASAL SWAB   Corrected   Comment: CORRECTED ON 08/21 AT 2008: PREVIOUSLY REPORTED AS NASAL SWAB   Respiratory Syncytial Virus A NOT DETECTED   Final   Respiratory Syncytial Virus B NOT DETECTED   Final   Influenza A NOT DETECTED   Final   Influenza B NOT DETECTED   Final   Parainfluenza 1 NOT DETECTED   Final   Parainfluenza 2 NOT DETECTED   Final   Parainfluenza 3 NOT DETECTED   Final   Metapneumovirus NOT DETECTED   Final   Rhinovirus NOT DETECTED   Final   Adenovirus NOT DETECTED   Final   Influenza A H1 NOT DETECTED   Final   Influenza A H3 NOT DETECTED   Final   Comment: (NOTE)           Normal Reference Range for each Analyte: NOT DETECTED     Testing performed using the Luminex xTAG Respiratory Viral Panel test     kit.     The analytical performance characteristics of this assay have been     determined by [facility].  The modifications have not been     cleared or approved by the FDA. This assay has been validated pursuant     to the CLIA regulations and is used  for clinical purposes.     Performed at [facility]     Studies:  Recent x-ray studies have been reviewed in detail by the Attending Physician  Scheduled Meds:  Scheduled Meds: . amLODipine  10 mg Oral Daily  . antiseptic oral rinse  7 mL Mouth Rinse q12n4p  . bisoprolol  20 mg Oral Daily  . chlorhexidine  15 mL Mouth Rinse BID  . ferrous sulfate  325 mg Oral Q breakfast  . folic acid  1 mg Oral Daily  . hydrocortisone sod succinate (SOLU-CORTEF) inj  25 mg Intravenous Q12H  . insulin aspart  0-9 Units Subcutaneous TID WC  . levofloxacin  750 mg Oral Q24H  . montelukast  10 mg Oral QHS  . pantoprazole  40 mg Oral Daily    Time spent  on care of this patient: 35 mins  Sheila Oats, Bell [facility] For Consults/Admissions - Flow Manager - [phone] Office  [phone] Pager [phone]  On-Call/Text Page:      [email or web address]      password [facility]  10/01/2013, 4:18 PM   LOS: 4 days

## 2013-10-01 NOTE — Progress Notes (Signed)
MD paged due to concern of whether patient should be placed on telemetry since am K+ draw was 2.3.  Today she has received 4 runs of K+, as well as, 60mEq K+ PO per previous nurse.  MD replied that for now we will do a STAT lab draw to ascertain new K+ levels.  Will continue to monitor patient.

## 2013-10-01 NOTE — Progress Notes (Signed)
ANTIBIOTIC CONSULT NOTE - Follow Up  Pharmacy Consult for Levaquin Indication: pneumonia  Allergies  Allergen Reactions  . Lexapro [Escitalopram Oxalate]     Hand problems     Patient Measurements: Height: 5\' 2"  (157.5 cm) Weight: 238 lb 5.1 oz (108.1 kg) IBW/kg (Calculated) : 50.1 Adjusted Body Weight:    Vital Signs: Temp: 98.5 F (36.9 C) (08/22 [id number]) Temp src: Oral (08/22 0608) BP: 185/74 mmHg (08/22 [id number]) Pulse Rate: 76 (08/22 0608) Intake/Output from previous day: 08/21 0701 - 08/22 0700 In: [id number] [P.O.:1060; I.V.:2175] Out: 200 [Urine:200] Intake/Output from this shift: Total I/O In: -  Out: 600 [Urine:400; Stool:200]  Labs:  Recent Labs  09/28/13 1355  09/29/13 0352 09/29/13 0830 09/30/13 0932 10/01/13 0752  WBC  --   --  3.7*  --   --  4.2  HGB  --   < > 6.7* 7.3* 9.8* 10.5*  PLT  --   --  184  --   --  144*  CREATININE 2.13*  --  1.61*  --   --  0.90  < > = values in this interval not displayed. Estimated Creatinine Clearance: 83.7 ml/min (by C-G formula based on Cr of 0.9). No results found for this basename: VANCOTROUGH, VANCOPEAK, VANCORANDOM, GENTTROUGH, GENTPEAK, GENTRANDOM, TOBRATROUGH, TOBRAPEAK, TOBRARND, AMIKACINPEAK, AMIKACINTROU, AMIKACIN,  in the last 72 hours   Microbiology:  Medical History: Past Medical History  Diagnosis Date  . Essential hypertension   . Eczema   . Asthma   . Diabetes mellitus   . Chronic insomnia   . Arthritis   . Candida infection   . GERD (gastroesophageal reflux disease)      Assessment: 53 y/o immunocompromised female on day 4 of Levaquin for CAP. Patient is currently afebrile, WBC 4.2. CrCl has improved from 40s to 84. Changing therapy from Levaquin 750 mg po q48h to q24h given improvement in renal function.  Goal of Therapy:  Resolution of symptoms  Plan:  Levaquin 750mg  po q24h  Megan E. Supple, Pharm.D Clinical Pharmacy Resident Pager: [phone] 10/01/2013 10:12 AM

## 2013-10-02 DIAGNOSIS — I10 Essential (primary) hypertension: Secondary | ICD-10-CM

## 2013-10-02 LAB — BASIC METABOLIC PANEL
Anion gap: 13 (ref 5–15)
BUN: 13 mg/dL (ref 6–23)
CO2: 26 mEq/L (ref 19–32)
Calcium: 8.9 mg/dL (ref 8.4–10.5)
Chloride: 100 mEq/L (ref 96–112)
Creatinine, Ser: 1.18 mg/dL — ABNORMAL HIGH (ref 0.50–1.10)
GFR calc Af Amer: 60 mL/min — ABNORMAL LOW (ref >90)
GFR calc non Af Amer: 52 mL/min — ABNORMAL LOW (ref >90)
Glucose, Bld: 199 mg/dL — ABNORMAL HIGH (ref 70–99)
Potassium: 2.3 mEq/L — CL (ref 3.7–5.3)
Sodium: 139 mEq/L (ref 137–147)

## 2013-10-02 LAB — CBC
HCT: 30.4 % — ABNORMAL LOW (ref 36.0–46.0)
Hemoglobin: 10.1 g/dL — ABNORMAL LOW (ref 12.0–15.0)
MCH: 27.7 pg (ref 26.0–34.0)
MCHC: 33.2 g/dL (ref 30.0–36.0)
MCV: 83.3 fL (ref 78.0–100.0)
Platelets: 123 10*3/uL — ABNORMAL LOW (ref 150–400)
RBC: 3.65 MIL/uL — ABNORMAL LOW (ref 3.87–5.11)
RDW: 18.7 % — ABNORMAL HIGH (ref 11.5–15.5)
WBC: 4.3 10*3/uL (ref 4.0–10.5)

## 2013-10-02 LAB — GLUCOSE, CAPILLARY
Glucose-Capillary: 158 mg/dL — ABNORMAL HIGH (ref 70–99)
Glucose-Capillary: 192 mg/dL — ABNORMAL HIGH (ref 70–99)
Glucose-Capillary: 288 mg/dL — ABNORMAL HIGH (ref 70–99)
Glucose-Capillary: 82 mg/dL (ref 70–99)

## 2013-10-02 LAB — POTASSIUM: Potassium: 3.3 mEq/L — ABNORMAL LOW (ref 3.7–5.3)

## 2013-10-02 LAB — MAGNESIUM: Magnesium: 1.7 mg/dL (ref 1.5–2.5)

## 2013-10-02 MED ORDER — HYDROXYZINE HCL 25 MG PO TABS
25.0000 mg | ORAL_TABLET | Freq: Every day | ORAL | Status: DC
  Administered 2013-10-03: 25 mg via ORAL
  Filled 2013-10-02: qty 1

## 2013-10-02 MED ORDER — POTASSIUM CHLORIDE CRYS ER 20 MEQ PO TBCR
40.0000 meq | EXTENDED_RELEASE_TABLET | ORAL | Status: AC
  Administered 2013-10-02 (×2): 40 meq via ORAL
  Filled 2013-10-02 (×2): qty 2

## 2013-10-02 MED ORDER — POTASSIUM CHLORIDE 10 MEQ/100ML IV SOLN
10.0000 meq | INTRAVENOUS | Status: AC
  Administered 2013-10-02 (×4): 10 meq via INTRAVENOUS
  Filled 2013-10-02 (×4): qty 100

## 2013-10-02 NOTE — Progress Notes (Signed)
CRITICAL VALUE ALERT  Critical value received:  K+ 2.3  Date of notification:  10/02/13  Time of notification:  0658  Critical value read back: yes  Nurse who received alert:  Dow Adolph  MD notified (1st page):  Viyuoh  Time of first page:  0707  MD notified (2nd page): Viyuoh  Time of second page:07:15  Responding MD:  Dillard Essex  Time MD responded:  [phone]

## 2013-10-02 NOTE — Progress Notes (Signed)
MD notified that patient complained she had a head to toe flushed feeling after magnesium administration.  VS taken and were within patient's normal range.  No new orders given.  Will continue to monitor patient.

## 2013-10-02 NOTE — Progress Notes (Addendum)
Progress Note  JUNIPER COBEY PIR:[id number] DOB: 1960/03/22 DOA: 09/27/2013 PCP: Default, Provider, MD  Admit HPI / Brief Narrative: 53 yo F w/ Hx Asthma, DM, HTN, psoriatic arthritis, eczema on methotrexate and chronic prednisone, and chronic kidney disease who presented 8/18 with fever, myalgias, tremors and SOB. Admitted by [facility] with suspected PNA, acute renal failure and ?serotonin syndrome. Had worsening SOB and mental status necessitationg bipap and PCCM consult.  Per EMR appears that Dr. Vassie Loll is her Rheumatologist at [facility].  After admit, her course has been complicated by the development of anemia in the absence of obvious blood loss, as well as renal insufficiency, and hypotension.  She has begun to stabilize clinically w/ empiric tx for CAP, but we need to see her renal function improve further, assure that her Hgb remains stable, and begin to progress activity/ambulate her.    Significant Events: 8/19 admit, bipap, persistent hypercarbia  8/19 CT head without contrast - Mild chronic ischemic white matter disease. No acute intracranial  abnormality seen. 8/19 CXR - stable cardiac enlargement  HPI/Subjective: Denies new complaints.  Assessment/Plan:  Acute hypercarbic/ hypoxic respiratory failure -unclear etiology - suspect some degree OHS/OSA +/- developing PNA, and anemia -resolved at rest, follow and O2 sats w/ ambulation in am   Asthma  -Continue DuoNeb QID PRN -Continue Singulair 10 mg daily  Hypotension  -Resolved  HTN  -recurring - resume home BP meds  -Echocardiogram pending    Acute on chronic renal failure  -suspect multifactorial r/t dehydration, ACE, NSAIDs - crt peaked at 2.5 -Baseline creatinine somewhere between 1.34 (taken 09/2011) & 2.5 (taken 09/2013) -crt improved w/ hydration - recheck in AM   Hyponatremia - mild  -Improving cont volume resuscitation - recheck in AM   Symptomatic Anemia, guaiac+ - unclear etiology   -Transfused 2 units  PRBC - Hgb 9.8 s/p transfusion - pt denies obvious blood loss - Fe studies not c/w chronic Fe deficiency - perhaps this is "anemia of chronic disease" related to psoriasis - follow Hgb trend - Hgb 9.6 per CBC at [facility] April 2015 -heme + but no gross bleeding and hgb remains stable - 10.5 today -Hemoglobin remains stable at this a.m. 8/23>>recommend outpt GI follow up on d/c  CAP (immunocompromised) -Patient on methotrexate and prednisone chronically -no obvious infiltrate but suspect may fluff out once she is rehydrated - recheck CXR in AM  -complete 7 day course of antibiotics, transition to levofloxacin PO 750 mg daily   Psoriatic arthritis/psoriasis  -Immunocompromised on chronic methotrexate and prednisone -Hold methotrexate, continue stress dose steroids but initiate taper back toward baseline prednisone dosing    Relative adrenal insufficiency - chronic prednisone  -Bp stable, Continue weaning down stress dose steroids   DM  -A1c 6.7 - CBG reasonably controlled at present   AMS  -multifactorial r/t hypercarbia, hypotension/SIRS exacerbated by Ativan admin -resolved - pt alert and oriented today   Possible Seratonin syndrome - unlikely -recently started on lexapro, presented with reports of "tremors, spasms" but otherwise no s/s - not tachycardic, hypertensive, no clonus, etc.  Hypokalemia -Again replace k, Hypomagnesemia -Mag replaced last pm Code Status: FULL Family Communication: no family present at time of exam Disposition Plan: Discharge next 24-48 hours  Consultants: PCCM  Antibiotics: Rocephin 8/18 Azithro 8/18 Vancomycin 8/19 >> 8/20 Zosyn 8/19 >> 8/20 Levofloxacin 8/20 >>  DVT prophylaxis: SCDs  Objective: VITAL SIGNS: Temp: 98.3 F (36.8 C) (08/23 1400) Temp src: Oral (08/23 1400) BP: 133/80 mmHg (08/23  1400) Pulse Rate: 82 (08/23 1400)  Intake/Output Summary (Last 24 hours) at 10/02/13 1606 Last data filed at 10/02/13 1139  Gross per 24  hour  Intake    300 ml  Output      0 ml  Net    300 ml   Exam: General: No acute respiratory distress at rest in bed  Lungs: Clear to auscultation bilaterally without wheezes or crackles appreciable  Cardiovascular: Regular rate and rhythm without murmur gallop or rub normal S1 and S2 Abdomen: Nontender, nondistended, soft, bowel sounds positive, no rebound, no ascites, no appreciable mass Extremities: No significant cyanosis, clubbing, or edema bilateral lower extremities  Data Reviewed: Basic Metabolic Panel:  Recent Labs Lab 09/28/13 1355 09/29/13 0352 10/01/13 0752 10/01/13 2112 10/02/13 0530 10/02/13 1415  NA 131* 133* 141 139 139  --   K 4.3 4.4 2.3* 2.5* 2.3* 3.3*  CL 99 101 99 98 100  --   CO2 $Re'19 20 27 27 26  'KbN$ --   GLUCOSE 121* 225* 170* 144* 199*  --   BUN 28* 24* $Remov'8 10 13  'MEHrKV$ --   CREATININE 2.13* 1.61* 0.90 1.11* 1.18*  --   CALCIUM 9.2 9.2 9.5 9.3 8.9  --   MG  --   --   --  0.5* 1.7  --    Liver Function Tests:  Recent Labs Lab 09/29/13 0352  AST 18  ALT 16  ALKPHOS 45  BILITOT 0.3  PROT 5.9*  ALBUMIN 2.4*   CBC:  Recent Labs Lab 09/28/13 0040 09/28/13 0905 09/29/13 0352 09/29/13 0830 09/30/13 0932 10/01/13 0752 10/02/13 0530  WBC 5.5 3.5* 3.7*  --   --  4.2 4.3  NEUTROABS 3.9  --   --   --   --   --   --   HGB 7.4* 7.1* 6.7* 7.3* 9.8* 10.5* 10.1*  HCT 23.9* 22.5* 20.9* 21.9* 30.2* 31.1* 30.4*  MCV 89.5 90.0 87.8  --   --  83.6 83.3  PLT 235 186 184  --   --  144* 123*   Cardiac Enzymes:  Recent Labs Lab 09/28/13 0040  TROPONINI <0.30   CBG:  Recent Labs Lab 10/01/13 1150 10/01/13 1639 10/01/13 2129 10/02/13 0745 10/02/13 1226  GLUCAP 168* 194* 143* 158* 192*    Recent Results (from the past 240 hour(s))  RAPID STREP SCREEN     Status: None   Collection Time    09/28/13 12:50 AM      Result Value Ref Range Status   Streptococcus, Group A Screen (Direct) NEGATIVE  NEGATIVE Final   Comment: (NOTE)     A Rapid Antigen test  may result negative if the antigen level in the     sample is below the detection level of this test. The FDA has not     cleared this test as a stand-alone test therefore the rapid antigen     negative result has reflexed to a Group A Strep culture.  CULTURE, GROUP A STREP     Status: None   Collection Time    09/28/13 12:50 AM      Result Value Ref Range Status   Specimen Description THROAT   Final   Special Requests NONE   Final   Culture     Final   Value: No Beta Hemolytic Streptococci Isolated     Performed at [facility]   Report Status 09/29/2013 FINAL   Final  MRSA PCR SCREENING  Status: None   Collection Time    09/28/13  6:46 AM      Result Value Ref Range Status   MRSA by PCR NEGATIVE  NEGATIVE Final   Comment:            The GeneXpert MRSA Assay (FDA     approved for NASAL specimens     only), is one component of a     comprehensive MRSA colonization     surveillance program. It is not     intended to diagnose MRSA     infection nor to guide or     monitor treatment for     MRSA infections.  CULTURE, BLOOD (ROUTINE X 2)     Status: None   Collection Time    09/28/13 11:45 AM      Result Value Ref Range Status   Specimen Description BLOOD LEFT HAND   Final   Special Requests BOTTLES DRAWN AEROBIC AND ANAEROBIC 10CC   Final   Culture  Setup Time     Final   Value: 09/28/2013 17:13     Performed at [facility]   Culture     Final   Value:        BLOOD CULTURE RECEIVED NO GROWTH TO DATE CULTURE WILL BE HELD FOR 5 DAYS BEFORE ISSUING A FINAL NEGATIVE REPORT     Performed at [facility]   Report Status PENDING   Incomplete  CULTURE, BLOOD (ROUTINE X 2)     Status: None   Collection Time    09/28/13 12:00 PM      Result Value Ref Range Status   Specimen Description BLOOD LEFT HAND   Final   Special Requests BOTTLES DRAWN AEROBIC ONLY 10CC   Final   Culture  Setup Time     Final   Value: 09/28/2013 17:14     Performed at [facility]   Culture     Final   Value:        BLOOD CULTURE RECEIVED NO GROWTH TO DATE CULTURE WILL BE HELD FOR 5 DAYS BEFORE ISSUING A FINAL NEGATIVE REPORT     Performed at [facility]   Report Status PENDING   Incomplete  RESPIRATORY VIRUS PANEL     Status: None   Collection Time    09/28/13  1:00 PM      Result Value Ref Range Status   Source - [facility]AN NASAL SWAB   Corrected   Comment: CORRECTED ON 08/21 AT 2008: PREVIOUSLY REPORTED AS NASAL SWAB   Respiratory Syncytial Virus A NOT DETECTED   Final   Respiratory Syncytial Virus B NOT DETECTED   Final   Influenza A NOT DETECTED   Final   Influenza B NOT DETECTED   Final   Parainfluenza 1 NOT DETECTED   Final   Parainfluenza 2 NOT DETECTED   Final   Parainfluenza 3 NOT DETECTED   Final   Metapneumovirus NOT DETECTED   Final   Rhinovirus NOT DETECTED   Final   Adenovirus NOT DETECTED   Final   Influenza A H1 NOT DETECTED   Final   Influenza A H3 NOT DETECTED   Final   Comment: (NOTE)           Normal Reference Range for each Analyte: NOT DETECTED     Testing performed using the Luminex xTAG Respiratory Viral Panel test     kit.     The analytical performance characteristics of  this assay have been     determined by [facility].  The modifications have not been     cleared or approved by the FDA. This assay has been validated pursuant     to the CLIA regulations and is used for clinical purposes.     Performed at [facility]     Studies:  Recent x-ray studies have been reviewed in detail by the Attending Physician  Scheduled Meds:  Scheduled Meds: . amLODipine  10 mg Oral Daily  . antiseptic oral rinse  7 mL Mouth Rinse q12n4p  . bisoprolol  20 mg Oral Daily  . chlorhexidine  15 mL Mouth Rinse BID  . ferrous sulfate  325 mg Oral Q breakfast  . folic acid  1 mg Oral Daily  . hydrocortisone sod succinate (SOLU-CORTEF) inj  25 mg Intravenous Q12H  . insulin aspart  0-9 Units Subcutaneous TID WC  .  levofloxacin  750 mg Oral Q24H  . montelukast  10 mg Oral QHS  . pantoprazole  40 mg Oral Daily    Time spent on care of this patient: 35 mins  Sheila Oats, MD [facility] For Consults/Admissions - Flow Manager - [phone] Office  [phone] Pager [phone]  On-Call/Text Page:      [email or web address]      password [facility]  10/02/2013, 4:06 PM   LOS: 5 days

## 2013-10-03 DIAGNOSIS — J45909 Unspecified asthma, uncomplicated: Secondary | ICD-10-CM

## 2013-10-03 LAB — BASIC METABOLIC PANEL WITH GFR
Anion gap: 12 (ref 5–15)
BUN: 17 mg/dL (ref 6–23)
CO2: 24 meq/L (ref 19–32)
Calcium: 8.8 mg/dL (ref 8.4–10.5)
Chloride: 103 meq/L (ref 96–112)
Creatinine, Ser: 1.44 mg/dL — ABNORMAL HIGH (ref 0.50–1.10)
GFR calc Af Amer: 47 mL/min — ABNORMAL LOW
GFR calc non Af Amer: 41 mL/min — ABNORMAL LOW
Glucose, Bld: 141 mg/dL — ABNORMAL HIGH (ref 70–99)
Potassium: 3.3 meq/L — ABNORMAL LOW (ref 3.7–5.3)
Sodium: 139 meq/L (ref 137–147)

## 2013-10-03 LAB — GLUCOSE, CAPILLARY
Glucose-Capillary: 145 mg/dL — ABNORMAL HIGH (ref 70–99)
Glucose-Capillary: 169 mg/dL — ABNORMAL HIGH (ref 70–99)

## 2013-10-03 MED ORDER — POTASSIUM CHLORIDE ER 10 MEQ PO TBCR: 20.0000 meq | EXTENDED_RELEASE_TABLET | Freq: Every day | ORAL | Status: DC

## 2013-10-03 MED ORDER — LEVOFLOXACIN 750 MG PO TABS: 750.0000 mg | ORAL_TABLET | ORAL | Status: DC

## 2013-10-03 MED ORDER — POTASSIUM CHLORIDE ER 10 MEQ PO TBCR: 10.0000 meq | EXTENDED_RELEASE_TABLET | Freq: Every day | ORAL | Status: DC

## 2013-10-03 MED ORDER — POTASSIUM CHLORIDE CRYS ER 20 MEQ PO TBCR
40.0000 meq | EXTENDED_RELEASE_TABLET | ORAL | Status: AC
  Administered 2013-10-03 (×2): 40 meq via ORAL
  Filled 2013-10-03 (×2): qty 2

## 2013-10-03 NOTE — Progress Notes (Signed)
Pt requested PRN Ambien. Per MD on 10/01/13 pt is to wear CPAP if requesting Ambien. Pt is refusing to wear CPAP. PRN Ambien not given.

## 2013-10-03 NOTE — Progress Notes (Signed)
Nsg Discharge Note  Admit Date:  09/27/2013 Discharge date: 10/03/2013   MAITTE BARRICKMAN to be D/C'd Home per MD order.  AVS completed.  Copy for chart, and copy for patient signed, and dated. Patient/caregiver able to verbalize understanding.  Discharge Medication:   Medication List    STOP taking these medications       escitalopram 10 MG tablet  Commonly known as:  LEXAPRO     lisinopril 20 MG tablet  Commonly known as:  PRINIVIL,ZESTRIL     metFORMIN 1000 MG tablet  Commonly known as:  GLUCOPHAGE      TAKE these medications       albuterol (2.5 MG/3ML) 0.083% nebulizer solution  Commonly known as:  PROVENTIL  Take 2.5 mg by nebulization every 6 (six) hours as needed. For shortness of breath.     albuterol 108 (90 BASE) MCG/ACT inhaler  Commonly known as:  PROVENTIL HFA;VENTOLIN HFA  Inhale 2 puffs into the lungs every 6 (six) hours as needed. For shortness of breath.     allopurinol 100 MG tablet  Commonly known as:  ZYLOPRIM  Take 100 mg by mouth daily.     amLODipine 10 MG tablet  Commonly known as:  NORVASC  Take 10 mg by mouth daily.     aspirin EC 81 MG tablet  Take 81 mg by mouth daily.     bisoprolol 10 MG tablet  Commonly known as:  ZEBETA  Take 20 mg by mouth daily.     chlorthalidone 25 MG tablet  Commonly known as:  HYGROTON  Take 25 mg by mouth daily.     cyclobenzaprine 10 MG tablet  Commonly known as:  FLEXERIL  Take 10 mg by mouth daily as needed for muscle spasms.     diclofenac sodium 1 % Gel  Commonly known as:  VOLTAREN  Apply 2 g topically daily as needed. For joint pain     diphenhydrAMINE 25 mg capsule  Commonly known as:  BENADRYL  Take 25 mg by mouth every 6 (six) hours as needed for allergies.     EPINEPHrine 0.3 mg/0.3 mL Soaj injection  Commonly known as:  EPIPEN  Inject 0.3 mLs (0.3 mg total) into the muscle once.     esomeprazole 20 MG capsule  Commonly known as:  NEXIUM  Take 20 mg by mouth daily before breakfast.      ferrous sulfate 325 (65 FE) MG tablet  Take 325 mg by mouth See admin instructions. Take on Tuesdays and Thursdays     fluticasone 50 MCG/ACT nasal spray  Commonly known as:  FLONASE  Place 1 spray into both nostrils daily as needed for allergies or rhinitis.     folic acid 1 MG tablet  Commonly known as:  FOLVITE  Take 0.4 mg by mouth daily.     gabapentin 100 MG capsule  Commonly known as:  NEURONTIN  Take 100-200 mg by mouth 3 (three) times daily. Take 100 MG twice daily, and 200 MG at bedtime.     glyBURIDE 2.5 MG tablet  Commonly known as:  DIABETA  Take 2.5-7.5 mg by mouth 2 (two) times daily with a meal. Take 7.5 MG in the morning and 2.5 MG at night.     hydrOXYzine 25 MG capsule  Commonly known as:  VISTARIL  Take 25 mg by mouth daily as needed for anxiety.     levofloxacin 750 MG tablet  Commonly known as:  LEVAQUIN  Take 1 tablet (750  mg total) by mouth daily.     methotrexate 25 MG/ML Soln  Inject 15 mg into the vein every 7 (seven) days. Inject on Saturdays. 15 MG = 0.6 mL     montelukast 10 MG tablet  Commonly known as:  SINGULAIR  Take 10 mg by mouth at bedtime.     oxyCODONE 15 MG immediate release tablet  Commonly known as:  ROXICODONE  Take 15 mg by mouth 2 (two) times daily as needed. For pain.     potassium chloride 10 MEQ tablet  Commonly known as:  K-DUR  Take 1 tablet (10 mEq total) by mouth daily.     predniSONE 5 MG tablet  Commonly known as:  DELTASONE  Take 7.5 mg by mouth daily.     traZODone 100 MG tablet  Commonly known as:  DESYREL  Take 100 mg by mouth daily as needed for sleep.     triamcinolone ointment 0.1 %  Commonly known as:  KENALOG  Apply 1 application topically daily as needed. For rash        Discharge Assessment: Filed Vitals:   10/03/13 1348  BP: 136/81  Pulse: 74  Temp: 98.5 F (36.9 C)  Resp: 17   Skin clean, dry and intact without evidence of skin break down, no evidence of skin tears noted. IV  catheter discontinued intact. Site without signs and symptoms of complications - no redness or edema noted at insertion site, patient denies c/o pain - only slight tenderness at site.  Dressing with slight pressure applied.  D/c Instructions-Education: Discharge instructions given to patient/family with verbalized understanding. D/c education completed with patient/family including follow up instructions, medication list, d/c activities limitations if indicated, with other d/c instructions as indicated by MD - patient able to verbalize understanding, all questions fully answered. Patient instructed to return to ED, call 911, or call MD for any changes in condition.  Patient escorted via [place], and D/C home via private auto.  Dayle Points, RN 10/03/2013 3:20 PM

## 2013-10-03 NOTE — Care Management Note (Signed)
    Page 1 of 1   10/03/2013     3:14:44 PM CARE MANAGEMENT NOTE 10/03/2013  Patient:  Jodi Bell, Jodi Bell   Account Number:  [id number]  Date Initiated:  10/03/2013  Documentation initiated by:  Tomi Bamberger  Subjective/Objective Assessment:   dx fever  admit- lives with family.  pta indep.     Action/Plan:   Anticipated DC Date:  09/19/2013   Anticipated DC Plan:  [place]  CM consult      Choice offered to / List presented to:             Status of service:  Completed, signed off Medicare Important Message given?  YES (If response is "NO", the following Medicare IM given date fields will be blank) Date Medicare IM given:  10/03/2013 Medicare IM given by:  Tomi Bamberger Date Additional Medicare IM given:   Additional Medicare IM given by:    Discharge Disposition:  HOME/SELF CARE  Per UR Regulation:  Reviewed for med. necessity/level of care/duration of stay  If discussed at [place] of Stay Meetings, dates discussed:    Comments:

## 2013-10-03 NOTE — Progress Notes (Signed)
Patient stated that she doesn't want to wear the CPAP tonight. RT advised to call if she changes her mind. RT to continue to monitor.

## 2013-10-03 NOTE — Discharge Summary (Signed)
Physician Discharge Summary  Jodi Bell ZJI:[id number] DOB: 1960/04/28 DOA: 09/27/2013  PCP: Default, Provider, MD  Admit date: 09/27/2013 Discharge date: 10/03/2013  Time spent: >30 minutes  Recommendations for Outpatient Follow-up:    Follow-up Information   Please follow up. (PCP in [facility] 1week or may call [facility]/Dr Maudry Mayhew)     PCP to refer to GI outpt  Follow up labs per PCP -bMET for K and renal function.  Discharge Diagnoses:  Principal Problem:   Respiratory failure Active Problems:   DIABETES MELLITUS, TYPE II   ESSENTIAL HYPERTENSION   Serotonin syndrome   Encephalopathy acute   Hypotension, unspecified   Discharge Condition: improved/stable  Diet recommendation: modified carb  Filed Weights   09/27/13 2310 09/28/13 0636 09/30/13 0501  Weight: 86.637 kg (191 lb) 107.9 kg (237 lb 14 oz) 108.1 kg (238 lb 5.1 oz)    History of present illness:  Pt is a 53 y.o. female with PMH significant for Diabetes, Hypertension, Psoriasis Arthritis on methotrexate and chronic prednisone, depression, recently started on lexapro 2 weeks ago who presents with tremors, spasm, of all 4 extremities, and SOB. Patient unable to provide history due to AMS. In the ED she was given 1 Mg of Ativan. PH 7.2. PCO2 at 51, ABG showed PO2 107, SBP in the 90 She was started on BIPAP and critical care consulted. She was admitted to SDU for further eval and management. Significant Events:  8/19 admit, bipap, persistent hypercarbia  8/19 CT head without contrast - Mild chronic ischemic white matter disease. No acute intracranial abnormality seen.  8/19 CXR - stable cardiac enlargement  Hospital Course:   Acute hypercarbic/ hypoxic respiratory failure  -unclear etiology - suspect some degree OHS/OSA +/- developing PNA, and anemia  -As discussed above upon admission patient was placed on BiPAP, critical care was consulted and followed patient. -She was placed on empiric  antibiotics with developing pneumonia as above. -She proved a clinically, and BiPAP was DC'd and she was put on CPAP at bedtime. She has been oxygenating well on room air refusing further CPAP. Followup chest x-ray on 8/22 showed no evidence of pulmonary edema or other active cardiopulmonary process. She has remained afebrile, hemodynamically stable with no leukocytosis and will be discharged to follow up outpatient. Asthma  -She was maintained on bronchodilators and Singulair.She is to continue her outpt meds on d/c Hypotension  -She was volume resuscitated with IV fluids and was transfused packed red blood cells.She is pancultured to date - no growth She was empirically treated with stress dose steroids for relative adrenal insufficiency>> hypotension resolved and steroids have been tapered and she remains hemodynamically stable. She is to continue outpatient prednisone on discharge.  HTN  -recurringafter treatment of hypertension as above>> home BP medsResumed  -Echocardiogram  showed EF of 65%, no regional wall motion abnormalities   Acute on chronic renal failure  -suspect multifactorial r/t dehydration, ACE, NSAIDs - crt peaked at 2.5  -Baseline creatinine somewhere between 1.34 (taken 09/2011) & 2.5 (taken 09/2013)  - improved with hydration, she is to continue to stay off ACE inhibitor, and follow up with her PCP Hyponatremia - mild  -Improving cont volume resuscitation - recheck in AM  Symptomatic Anemia, guaiac+ - unclear etiology  -Transfused 2 units PRBC - Hgb 9.8 s/p transfusion - pt denies obvious blood loss - Fe studies not c/w chronic Fe deficiency - perhaps this is "anemia of chronic disease" related to psoriasis - follow Hgb trend - Hgb  9.6 per CBC at [facility] April 2015  -heme + but no gross bleeding and hgb remains stable - 10.5 today  -Hemoglobin remained stable  Last 10.1on 8/23>>recommend outpt GI follow up on d/c>> PCP to refer to GI outpt  CAP (immunocompromised)   -Patient on methotrexate and prednisone chronically  -no obvious infiltrate but suspect may fluff out once she is rehydrated - recheck CXR in AM  -she oral abx upon discharge for total of  7 days. Psoriatic arthritis/psoriasis  -Immunocompromised on chronic methotrexate and prednisone  -stress dose steroids were tapered snd she is to resume her outpt prednisone and MTX Relative adrenal insufficiency - chronic prednisone  -As above, stress dose steroids tapered and now to resume outpt prednisone on d/c DM  -A1c 6.7 - CBG reasonably controlled at present  AMS  -multifactorial r/t hypercarbia, hypotension/SIRS exacerbated by Ativan admin  -resolved - pt alert and oriented today  Possible Seratonin syndrome - unlikely  -pt had recently been started on lexapro,  andpresented with reports of "tremors, spasms" but otherwise no s/s - not tachycardic, hypertensive, no clonus, etc. The lexapro was dc'ed this admission. Her symptoms resolved with treatment as above and she is to follow up with PCP for referral to outpt psych as appropriate. Hypokalemia  -d/t diuretics, her k was replaced>> follow up with PCP for recheck. Hypomagnesemia  -resolved, Mag was replaced.  Procedures:  2-D echo  Study Conclusions  - Left ventricle: The cavity size was normal. Wall thickness was increased increased in a pattern of mild to moderate LVH. The estimated ejection fraction was 65%. Wall motion was normal; there were no regional wall motion abnormalities. - Right ventricle: The cavity size was normal. Systolic function was normal. Lower extremity Doppler  Summary:  - No evidence of deep vein thrombosis involving the right lower extremity and left lower extremity. - No evidence of Baker&'s cyst on the right or left.   Consultations:  CCM   Discharge Exam: Filed Vitals:   10/03/13 1002  BP: 138/81  Pulse:   Temp:   Resp:    Exam:  General: No acute respiratory distress at rest in bed   Lungs: Clear to auscultation bilaterally without wheezes or crackles appreciable  Cardiovascular: Regular rate and rhythm without murmur gallop or rub normal S1 and S2  Abdomen: Nontender, nondistended, soft, bowel sounds positive, no rebound, no ascites, no appreciable mass  Extremities: No significant cyanosis, clubbing, or edema bilateral lower extremities    Discharge Instructions You were cared for by a hospitalist during your hospital stay. If you have any questions about your discharge medications or the care you received while you were in the hospital after you are discharged, you can call the unit and asked to speak with the hospitalist on call if the hospitalist that took care of you is not available. Once you are discharged, your primary care physician will handle any further medical issues. Please note that NO REFILLS for any discharge medications will be authorized once you are discharged, as it is imperative that you return to your primary care physician (or establish a relationship with a primary care physician if you do not have one) for your aftercare needs so that they can reassess your need for medications and monitor your lab values.  Discharge Instructions   Diet Carb Modified    Complete by:  As directed      Increase activity slowly    Complete by:  As directed  Medication List    STOP taking these medications       escitalopram 10 MG tablet  Commonly known as:  LEXAPRO     lisinopril 20 MG tablet  Commonly known as:  PRINIVIL,ZESTRIL     metFORMIN 1000 MG tablet  Commonly known as:  GLUCOPHAGE      TAKE these medications       albuterol (2.5 MG/3ML) 0.083% nebulizer solution  Commonly known as:  PROVENTIL  Take 2.5 mg by nebulization every 6 (six) hours as needed. For shortness of breath.     albuterol 108 (90 BASE) MCG/ACT inhaler  Commonly known as:  PROVENTIL HFA;VENTOLIN HFA  Inhale 2 puffs into the lungs every 6 (six) hours as needed.  For shortness of breath.     allopurinol 100 MG tablet  Commonly known as:  ZYLOPRIM  Take 100 mg by mouth daily.     amLODipine 10 MG tablet  Commonly known as:  NORVASC  Take 10 mg by mouth daily.     aspirin EC 81 MG tablet  Take 81 mg by mouth daily.     bisoprolol 10 MG tablet  Commonly known as:  ZEBETA  Take 20 mg by mouth daily.     chlorthalidone 25 MG tablet  Commonly known as:  HYGROTON  Take 25 mg by mouth daily.     cyclobenzaprine 10 MG tablet  Commonly known as:  FLEXERIL  Take 10 mg by mouth daily as needed for muscle spasms.     diclofenac sodium 1 % Gel  Commonly known as:  VOLTAREN  Apply 2 g topically daily as needed. For joint pain     diphenhydrAMINE 25 mg capsule  Commonly known as:  BENADRYL  Take 25 mg by mouth every 6 (six) hours as needed for allergies.     EPINEPHrine 0.3 mg/0.3 mL Soaj injection  Commonly known as:  EPIPEN  Inject 0.3 mLs (0.3 mg total) into the muscle once.     esomeprazole 20 MG capsule  Commonly known as:  NEXIUM  Take 20 mg by mouth daily before breakfast.     ferrous sulfate 325 (65 FE) MG tablet  Take 325 mg by mouth See admin instructions. Take on Tuesdays and Thursdays     fluticasone 50 MCG/ACT nasal spray  Commonly known as:  FLONASE  Place 1 spray into both nostrils daily as needed for allergies or rhinitis.     folic acid 1 MG tablet  Commonly known as:  FOLVITE  Take 0.4 mg by mouth daily.     gabapentin 100 MG capsule  Commonly known as:  NEURONTIN  Take 100-200 mg by mouth 3 (three) times daily. Take 100 MG twice daily, and 200 MG at bedtime.     glyBURIDE 2.5 MG tablet  Commonly known as:  DIABETA  Take 2.5-7.5 mg by mouth 2 (two) times daily with a meal. Take 7.5 MG in the morning and 2.5 MG at night.     hydrOXYzine 25 MG capsule  Commonly known as:  VISTARIL  Take 25 mg by mouth daily as needed for anxiety.     levofloxacin 750 MG tablet  Commonly known as:  LEVAQUIN  Take 1 tablet (750  mg total) by mouth daily.     methotrexate 25 MG/ML Soln  Inject 15 mg into the vein every 7 (seven) days. Inject on Saturdays. 15 MG = 0.6 mL     montelukast 10 MG tablet  Commonly known as:  SINGULAIR  Take 10 mg by mouth at bedtime.     oxyCODONE 15 MG immediate release tablet  Commonly known as:  ROXICODONE  Take 15 mg by mouth 2 (two) times daily as needed. For pain.     potassium chloride 10 MEQ tablet  Commonly known as:  K-DUR  Take 2 tablets (20 mEq total) by mouth daily.     predniSONE 5 MG tablet  Commonly known as:  DELTASONE  Take 7.5 mg by mouth daily.     traZODone 100 MG tablet  Commonly known as:  DESYREL  Take 100 mg by mouth daily as needed for sleep.     triamcinolone ointment 0.1 %  Commonly known as:  KENALOG  Apply 1 application topically daily as needed. For rash       Allergies  Allergen Reactions  . Lexapro [Escitalopram Oxalate]     Hand problems       The results of significant diagnostics from this hospitalization (including imaging, microbiology, ancillary and laboratory) are listed below for reference.    Significant Diagnostic Studies: Dg Chest 2 View  09/28/2013   CLINICAL DATA:  Chest pain.  EXAM: CHEST  2 VIEW  COMPARISON:  04/06/2009.  FINDINGS: The heart is mildly enlarged but stable. There is mild tortuosity of the thoracic aorta. The lungs are clear. No pleural effusion. The bony thorax is intact.  IMPRESSION: Stable cardiac enlargement.  No acute pulmonary findings.   Electronically Signed   By: Kalman Jewels M.D.   On: 09/28/2013 00:27   Ct Head Wo Contrast  09/28/2013   CLINICAL DATA:  Altered mental status.  EXAM: CT HEAD WITHOUT CONTRAST  TECHNIQUE: Contiguous axial images were obtained from the base of the skull through the vertex without intravenous contrast.  COMPARISON:  None.  FINDINGS: Bony calvarium appears intact. No mass effect or midline shift is noted. Ventricular size is within normal limits. There is no evidence  of mass lesion, hemorrhage or acute infarction. Mild chronic ischemic white matter disease is noted.  IMPRESSION: Mild chronic ischemic white matter disease. No acute intracranial abnormality seen.   Electronically Signed   By: Sabino Dick M.D.   On: 09/28/2013 10:27   Dg Chest Port 1 View  10/01/2013   CLINICAL DATA:  Post hydration. Evaluate for edema. History of diabetes.  EXAM: PORTABLE CHEST - 1 VIEW  COMPARISON:  09/29/2013 and 09/27/2013.  FINDINGS: 0659 hr. The heart size is at the upper limits of normal for portable AP technique. The lungs are clear. There is no edema, pleural effusion or pneumothorax. The osseous structures appear normal.  IMPRESSION: No evidence of pulmonary edema or other active cardiopulmonary process.   Electronically Signed   By: Camie Patience M.D.   On: 10/01/2013 09:06   Dg Chest Port 1 View  09/29/2013   CLINICAL DATA:  r/o pna, fever, cough  EXAM: PORTABLE CHEST - 1 VIEW  COMPARISON:  09/27/2013  FINDINGS: Mild cardiomegaly. Tortuous aorta. Lungs are clear. No definite effusion. Regional bones unremarkable.  IMPRESSION: Stable cardiomegaly.  No acute disease.   Electronically Signed   By: Arne Cleveland M.D.   On: 09/29/2013 10:17    Microbiology: Recent Results (from the past 240 hour(s))  RAPID STREP SCREEN     Status: None   Collection Time    09/28/13 12:50 AM      Result Value Ref Range Status   Streptococcus, Group A Screen (Direct) NEGATIVE  NEGATIVE Final   Comment: (NOTE)  A Rapid Antigen test may result negative if the antigen level in the     sample is below the detection level of this test. The FDA has not     cleared this test as a stand-alone test therefore the rapid antigen     negative result has reflexed to a Group A Strep culture.  CULTURE, GROUP A STREP     Status: None   Collection Time    09/28/13 12:50 AM      Result Value Ref Range Status   Specimen Description THROAT   Final   Special Requests NONE   Final   Culture     Final    Value: No Beta Hemolytic Streptococci Isolated     Performed at [facility]   Report Status 09/29/2013 FINAL   Final  MRSA PCR SCREENING     Status: None   Collection Time    09/28/13  6:46 AM      Result Value Ref Range Status   MRSA by PCR NEGATIVE  NEGATIVE Final   Comment:            The GeneXpert MRSA Assay (FDA     approved for NASAL specimens     only), is one component of a     comprehensive MRSA colonization     surveillance program. It is not     intended to diagnose MRSA     infection nor to guide or     monitor treatment for     MRSA infections.  CULTURE, BLOOD (ROUTINE X 2)     Status: None   Collection Time    09/28/13 11:45 AM      Result Value Ref Range Status   Specimen Description BLOOD LEFT HAND   Final   Special Requests BOTTLES DRAWN AEROBIC AND ANAEROBIC 10CC   Final   Culture  Setup Time     Final   Value: 09/28/2013 17:13     Performed at [facility]   Culture     Final   Value:        BLOOD CULTURE RECEIVED NO GROWTH TO DATE CULTURE WILL BE HELD FOR 5 DAYS BEFORE ISSUING A FINAL NEGATIVE REPORT     Performed at [facility]   Report Status PENDING   Incomplete  CULTURE, BLOOD (ROUTINE X 2)     Status: None   Collection Time    09/28/13 12:00 PM      Result Value Ref Range Status   Specimen Description BLOOD LEFT HAND   Final   Special Requests BOTTLES DRAWN AEROBIC ONLY 10CC   Final   Culture  Setup Time     Final   Value: 09/28/2013 17:14     Performed at [facility]   Culture     Final   Value:        BLOOD CULTURE RECEIVED NO GROWTH TO DATE CULTURE WILL BE HELD FOR 5 DAYS BEFORE ISSUING A FINAL NEGATIVE REPORT     Performed at [facility]   Report Status PENDING   Incomplete  RESPIRATORY VIRUS PANEL     Status: None   Collection Time    09/28/13  1:00 PM      Result Value Ref Range Status   Source - RVPAN NASAL SWAB   Corrected   Comment: CORRECTED ON 08/21 AT 2008: PREVIOUSLY REPORTED AS NASAL  SWAB   Respiratory Syncytial Virus A NOT DETECTED   Final  Respiratory Syncytial Virus B NOT DETECTED   Final   Influenza A NOT DETECTED   Final   Influenza B NOT DETECTED   Final   Parainfluenza 1 NOT DETECTED   Final   Parainfluenza 2 NOT DETECTED   Final   Parainfluenza 3 NOT DETECTED   Final   Metapneumovirus NOT DETECTED   Final   Rhinovirus NOT DETECTED   Final   Adenovirus NOT DETECTED   Final   Influenza A H1 NOT DETECTED   Final   Influenza A H3 NOT DETECTED   Final   Comment: (NOTE)           Normal Reference Range for each Analyte: NOT DETECTED     Testing performed using the Luminex xTAG Respiratory Viral Panel test     kit.     The analytical performance characteristics of this assay have been     determined by [facility].  The modifications have not been     cleared or approved by the FDA. This assay has been validated pursuant     to the CLIA regulations and is used for clinical purposes.     Performed at [facility]: Basic Metabolic Panel:  Recent Labs Lab 09/29/13 0352 10/01/13 0752 10/01/13 2112 10/02/13 0530 10/02/13 1415 10/03/13 0516  NA 133* 141 139 139  --  139  K 4.4 2.3* 2.5* 2.3* 3.3* 3.3*  CL 101 99 98 100  --  103  CO2 _0 --  24  GLUCOSE 225* 170* 144* 199*  --  141*  BUN 24* _1 --  17  CREATININE 1.61* 0.90 1.11* 1.18*  --  1.44*  CALCIUM 9.2 9.5 9.3 8.9  --  8.8  MG  --   --  0.5* 1.7  --   --    Liver Function Tests:  Recent Labs Lab 09/29/13 0352  AST 18  ALT 16  ALKPHOS 45  BILITOT 0.3  PROT 5.9*  ALBUMIN 2.4*   No results found for this basename: LIPASE, AMYLASE,  in the last 168 hours No results found for this basename: AMMONIA,  in the last 168 hours CBC:  Recent Labs Lab 09/28/13 0040 09/28/13 0905 09/29/13 0352 09/29/13 0830 09/30/13 0932 10/01/13 0752 10/02/13 0530  WBC 5.5 3.5* 3.7*  --   --  4.2 4.3  NEUTROABS 3.9  --   --   --   --   --   --   HGB 7.4* 7.1*  6.7* 7.3* 9.8* 10.5* 10.1*  HCT 23.9* 22.5* 20.9* 21.9* 30.2* 31.1* 30.4*  MCV 89.5 90.0 87.8  --   --  83.6 83.3  PLT 235 186 184  --   --  144* 123*   Cardiac Enzymes:  Recent Labs Lab 09/28/13 0040  TROPONINI <0.30   BNP: BNP (last 3 results) No results found for this basename: PROBNP,  in the last 8760 hours CBG:  Recent Labs Lab 10/02/13 1226 10/02/13 1644 10/02/13 2107 10/03/13 0737 10/03/13 1139  GLUCAP 192* 288* 82 145* 169*       Signed:  Ednah Hammock C  [facility] 10/03/2013, 1:42 PM

## 2013-10-04 LAB — CULTURE, BLOOD (ROUTINE X 2)
Culture: NO GROWTH
Culture: NO GROWTH

## 2013-10-05 NOTE — Plan of Care (Cosign Needed)
Received a call from our office staff. Post discharge patient was reviewing her home medications and comparing these to her discharge medications and noted there was no mention of whether or not she was to continue her metformin. I reviewed her after visit summary in the discharge Navigator. It does not appear as if the patient's metformin was listed as a medication to continue at time of discharge but nor was it listed as a medication to stop. I suspect that the time the initial medication reconciliation was completed that metformin was never listed as a home medication. Nonetheless I also reviewed her notes during the hospitalization noting she had acute renal failure with underlying chronic kidney disease stage III. During the initial part of her hospitalization all nephrotoxic and or offending medications were held. I also reviewed her CBG readings at time of discharge and her sugars were well controlled at less than 180 on glyburide alone. Because of her ongoing renal insufficiency and stage III chronic kidney disease I instructed our office staff to inform the patient to not take the metformin. She should also followup with her primary care provider and make them aware we stopped the metformin and that this was because of her kidney function.  Erin Hearing, ANP

## 2013-12-03 ENCOUNTER — Ambulatory Visit (INDEPENDENT_AMBULATORY_CARE_PROVIDER_SITE_OTHER): Payer: Medicare PPO | Admitting: Internal Medicine

## 2013-12-03 VITALS — BP 144/82 | HR 85 | Temp 98.1°F | Resp 16 | Ht 62.25 in | Wt 186.2 lb

## 2013-12-03 DIAGNOSIS — L509 Urticaria, unspecified: Secondary | ICD-10-CM

## 2013-12-03 MED ORDER — CETIRIZINE HCL 10 MG PO TABS: 10.0000 mg | ORAL_TABLET | Freq: Two times a day (BID) | ORAL | Status: DC

## 2013-12-03 MED ORDER — DIPHENHYDRAMINE HCL 50 MG/ML IJ SOLN
50.0000 mg | Freq: Once | INTRAMUSCULAR | Status: AC
  Administered 2013-12-03: 50 mg via INTRAMUSCULAR

## 2013-12-03 MED ORDER — BECLOMETHASONE DIPROPIONATE 80 MCG/ACT IN AERS: 2.0000 | INHALATION_SPRAY | Freq: Two times a day (BID) | RESPIRATORY_TRACT | Status: DC

## 2013-12-03 MED ORDER — ALPRAZOLAM ER 1 MG PO TB24: 1.0000 mg | ORAL_TABLET | Freq: Every day | ORAL | Status: DC

## 2013-12-03 NOTE — Progress Notes (Signed)
Subjective:  This chart was scribed for Tami Lin, MD by Dellis Filbert, ED Scribe at [facility].The patient was seen in exam room ** and the patient's care was started at 11:56 PM.   Patient ID: Jodi Bell, female    DOB: [birth year], 53 y.o.   MRN: [id number]  HPI HPI Comments: Jodi Bell is a 53 y.o. female who presents to [facility] complaining of rash all over her body which began on Tuesday. She states she has been using benadryl and zyrtec which at first provides some relief but the symptoms return. Pt usually has allergies and wheezing which she states is typical during this time of year. She states not changes in her appetite. She notes she stopped taking Trazodone and is now taking Ambien because of a twitching episode she had which she says it attributed to seritonin syndrome-see hosp. She says because of her episode she had to go to the hospital for 6 days ago at the end of August.  She says she was taking Ambien 6 year ago.  Pt notes she has been under a lot of stress, and has trouble sleeping but Ambien usually puts her to sleep.  Pt does not work.  She denies fever, nausea, vomiting, diarrhea, and trouble breathing at night.  Review of Systems  Constitutional: Negative for fever and appetite change.  Respiratory: Positive for wheezing.   Gastrointestinal: Negative for nausea, vomiting and diarrhea.  Skin: Positive for color change and rash.       Objective:   Physical Exam  Nursing note and vitals reviewed. Constitutional: She is oriented to person, place, and time. She appears well-developed and well-nourished. No distress.  HENT:  Head: Normocephalic and atraumatic.  Eyes: EOM are normal.  Neck: Normal range of motion.  Cardiovascular: Normal rate, regular rhythm and normal heart sounds.   No murmur heard. Pulmonary/Chest: Effort normal.  Lungs sound clear.  Neurological: She is alert and oriented to person, place, and time.  Skin:  Skin is warm and dry.  Hives large and small scattered in a symmetrical distribution along her torso.  Psychiatric: She has a normal mood and affect. Her behavior is normal.    BP 144/82  Pulse 85  Temp(Src) 98.1 F (36.7 C) (Oral)  Resp 16  Ht 5' 2.25" (1.581 m)  Wt 186 lb 3.2 oz (84.46 kg)  BMI 33.79 kg/m2  SpO2 99%       Assessment & Plan:  I personally performed the services described in this documentation, which was scribed in my presence. The recorded information has been reviewed and is accurate.  Hives - ?etio  Meds ordered this encounter  Medications  . zolpidem (AMBIEN) 10 MG tablet    Sig: Take 10 mg by mouth at bedtime as needed for sleep.  . beclomethasone (QVAR) 80 MCG/ACT inhaler    Sig: Inhale 2 puffs into the lungs 2 (two) times daily. For 1 month    Dispense:  1 Inhaler    Refill:  12  . cetirizine (ZYRTEC) 10 MG tablet    Sig: Take 1 tablet (10 mg total) by mouth 2 (two) times daily.    Dispense:  60 tablet    Refill:  0  . ALPRAZolam (XANAX XR) 1 MG 24 hr tablet    Sig: Take 1 tablet (1 mg total) by mouth daily. At bedtime    Dispense:  10 tablet    Refill:  0  . diphenhydrAMINE (BENADRYL) injection  50 mg    Sig:    --adv zyrtec to bid for 5-7 days F/u 1 week if not well

## 2014-01-02 ENCOUNTER — Ambulatory Visit (INDEPENDENT_AMBULATORY_CARE_PROVIDER_SITE_OTHER): Payer: Medicare PPO | Admitting: Family Medicine

## 2014-01-02 VITALS — BP 124/84 | HR 92 | Temp 97.8°F | Resp 16 | Ht 62.0 in | Wt 192.2 lb

## 2014-01-02 DIAGNOSIS — Z23 Encounter for immunization: Secondary | ICD-10-CM

## 2014-01-02 DIAGNOSIS — L500 Allergic urticaria: Secondary | ICD-10-CM

## 2014-01-02 NOTE — Progress Notes (Signed)
[facility] [address] [place] [address] [phone]  Date:  01/02/2014   Name:  Jodi Bell   DOB:  05-31-1960   MRN:  [id number]  PCP:  Default, Provider, MD    Chief Complaint: Follow-up and Urticaria   History of Present Illness:  Jodi Bell is a 53 y.o. very pleasant female patient who presents with the following:  She was seen here about one month ago with hives. She saw Dr. Laney Pastor with c/o rash all over her body.  Noted to have urticaria on her torso. Treated with zyrtec, benadryl IM. The benadryl helped for a little while but she never did get better; she has had hives most days since last month.  Some days she just has a couple of hives, some days they are more severe.   She has not noted any pattern to the hives- they do not seem associated with exercise or heat, any foods or any activities  She is still on chronic prednisone- she is taking about 10 mg daily now. She is also taking ranitidine, zyrtec, hydroxyzine, singulair.  She stopped taking benadryl as it caused dry eyes.  Last night she did take 2 benadryl.  Last night she noted hives "like a collar" around her neck and onto her face.   She has some photos   She is on prednisone for psoriatric arthritis, and is also on methotrexate.  She just went back on methotrexate about two weeks ago; her rheumatologist wondered if maybe this would help suppress her hives.    The hives are sometimes itchy, sometimes not. No lip or tongue swelling, no SOB.  She does have a history of mild asthma and is on prn albuterol, started avar 80 about one week ago.    She has had some hives in the past, but never anything so persistent.   She is not aware of any new allergens in her life.    She did have a temp of 100.7 and some aches 2 days ago; advil resolved these sx.   Her rheumatologist is Dr. Ouida Sills at [facility].   She did see her PCP last week at [facility].  She had blood testing for her renal function.    Patient Active Problem List   Diagnosis Date Noted  . Serotonin syndrome 09/28/2013  . Respiratory failure 09/28/2013  . Encephalopathy acute 09/28/2013  . Hypotension, unspecified 09/28/2013  . 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 DIABETES MELLITUS, TYPE II 03/06/2008  . INSOMNIA, CHRONIC 03/06/2008  . ESSENTIAL HYPERTENSION 03/06/2008  . ASTHMA 03/06/2008  . ECZEMA 03/06/2008  . BACK PAIN, LUMBAR, CHRONIC 03/06/2008    Past Medical History  Diagnosis Date  . Essential hypertension   . Eczema   . Asthma   . Diabetes mellitus   . Chronic insomnia   . Arthritis   . Candida infection   . GERD (gastroesophageal reflux disease)     Past Surgical History  Procedure Laterality Date  . Cesarean section    . Tubal ligation      History  Substance Use Topics  . Smoking status: Current Every Day Smoker    Last Attempt to Quit: 07/19/2011  . Smokeless tobacco: Not on file  . Alcohol Use: Yes    Family History  Problem Relation Age of Onset  . Lung cancer Mother   . Diabetes Sister   . Heart disease Sister   . Thyroid cancer      1/2 sister  .  Heart disease      1/2 sister    Allergies  Allergen Reactions  . Lexapro [Escitalopram Oxalate]     Hand problems     Medication list has been reviewed and updated.  Current Outpatient Prescriptions on File Prior to Visit  Medication Sig Dispense Refill  . albuterol (PROVENTIL HFA;VENTOLIN HFA) 108 (90 BASE) MCG/ACT inhaler Inhale 2 puffs into the lungs every 6 (six) hours as needed. For shortness of breath.    Marland Kitchen albuterol (PROVENTIL) (2.5 MG/3ML) 0.083% nebulizer solution Take 2.5 mg by nebulization every 6 (six) hours as needed. For shortness of breath.    . allopurinol (ZYLOPRIM) 100 MG tablet Take 100 mg by mouth daily.    Marland Kitchen ALPRAZolam (XANAX XR) 1 MG 24 hr tablet Take 1 tablet (1 mg total) by mouth daily. At bedtime 10 tablet 0  . amLODipine (NORVASC) 10 MG tablet Take 10 mg by mouth daily.    Marland Kitchen aspirin EC 81 MG  tablet Take 81 mg by mouth daily.    . beclomethasone (QVAR) 80 MCG/ACT inhaler Inhale 2 puffs into the lungs 2 (two) times daily. For 1 month 1 Inhaler 12  . bisoprolol (ZEBETA) 10 MG tablet Take 20 mg by mouth daily.    . cetirizine (ZYRTEC) 10 MG tablet Take 1 tablet (10 mg total) by mouth 2 (two) times daily. 60 tablet 0  . chlorthalidone (HYGROTON) 25 MG tablet Take 25 mg by mouth daily.    . cyclobenzaprine (FLEXERIL) 10 MG tablet Take 10 mg by mouth daily as needed for muscle spasms.    . diclofenac sodium (VOLTAREN) 1 % GEL Apply 2 g topically daily as needed. For joint pain    . diphenhydrAMINE (BENADRYL) 25 mg capsule Take 25 mg by mouth every 6 (six) hours as needed for allergies.    Marland Kitchen EPINEPHrine (EPIPEN) 0.3 mg/0.3 mL SOAJ injection Inject 0.3 mLs (0.3 mg total) into the muscle once. 2 Device 1  . esomeprazole (NEXIUM) 20 MG capsule Take 20 mg by mouth daily before breakfast.    . ferrous sulfate 325 (65 FE) MG tablet Take 325 mg by mouth See admin instructions. Take on Tuesdays and Thursdays    . fluticasone (FLONASE) 50 MCG/ACT nasal spray Place 1 spray into both nostrils daily as needed for allergies or rhinitis.    . folic acid (FOLVITE) 1 MG tablet Take 0.4 mg by mouth daily.    Marland Kitchen gabapentin (NEURONTIN) 100 MG capsule Take 100-200 mg by mouth 3 (three) times daily. Take 100 MG twice daily, and 200 MG at bedtime.    Marland Kitchen glyBURIDE (DIABETA) 2.5 MG tablet Take 2.5-7.5 mg by mouth 2 (two) times daily with a meal. Take 7.5 MG in the morning and 2.5 MG at night.    . hydrOXYzine (VISTARIL) 25 MG capsule Take 25 mg by mouth daily as needed for anxiety.    . methotrexate 25 MG/ML SOLN Inject 15 mg into the vein every 7 (seven) days. Inject on Saturdays. 15 MG = 0.6 mL    . montelukast (SINGULAIR) 10 MG tablet Take 10 mg by mouth at bedtime.    Marland Kitchen oxyCODONE (ROXICODONE) 15 MG immediate release tablet Take 15 mg by mouth 2 (two) times daily as needed. For pain.    . predniSONE (DELTASONE) 5  MG tablet Take 7.5 mg by mouth daily.    Marland Kitchen triamcinolone ointment (KENALOG) 0.1 % Apply 1 application topically daily as needed. For rash    . zolpidem (AMBIEN) 10  MG tablet Take 10 mg by mouth at bedtime as needed for sleep.     No current facility-administered medications on file prior to visit.    Review of Systems:  As per HPI- otherwise negative.   Physical Examination: Filed Vitals:   01/02/14 1237  BP: 124/84  Pulse: 92  Temp: 97.8 F (36.6 C)  Resp: 16   Filed Vitals:   01/02/14 1237  Height: 5\' 2"  (1.575 m)  Weight: 192 lb 3.2 oz (87.181 kg)   Body mass index is 35.14 kg/(m^2). Ideal Body Weight: Weight in (lb) to have BMI = 25: 136.4  GEN: WDWN, NAD, Non-toxic, A & O x 3, obese, looks well HEENT: Atraumatic, Normocephalic. Neck supple. No masses, No LAD.  Bilateral TM wnl, oropharynx normal.  PEERL,EOMI.   No evidence of angioedema  Ears and Nose: No external deformity. CV: RRR, No M/G/R. No JVD. No thrill. No extra heart sounds. PULM: CTA B, no wheezes, crackles, rhonchi. No retractions. No resp. distress. No accessory muscle use. ABD: S, NT, ND EXTR: No c/c/e NEURO Normal gait.  PSYCH: Normally interactive. Conversant. Not depressed or anxious appearing.  Calm demeanor.  She has scattered urticaria over her trunk.  Some are quite large- up to about 4inches in diameter   Assessment and Plan: Allergic urticaria - Plan: Ambulatory referral to Allergy  Immunization due - Plan: Flu Vaccine QUAD 36+ mos IM  Giant urticaria - persistent.  She is already doing everything as far as antihistamine that I can think of, and is also on prednisone.   Flu shot today Referral to allergies to see if there is anything else we can do for her hives.  If she can get a ride to bring her back I am happy to give her a shot of benadryl but I do not want her driving home after this medication.  She understands and will try to get a ride and RTC later today or tomorrow.   She has an  epipen at home and knows how to use it She will let me know if I can do anything else to help in the meantime   Signed Lamar Blinks, MD

## 2014-01-02 NOTE — Patient Instructions (Addendum)
I am sorry that you are having such a hard time with your hives Keep your epipen handy and I will set you up to see an allergist If you are able to get a ride I am happy to give you a shot of benadryl.

## 2014-01-03 ENCOUNTER — Telehealth: Payer: Self-pay

## 2014-01-04 ENCOUNTER — Emergency Department (HOSPITAL_BASED_OUTPATIENT_CLINIC_OR_DEPARTMENT_OTHER)
Admission: EM | Admit: 2014-01-04 | Discharge: 2014-01-05 | Disposition: A | Discharge status: Home / Self Care | Payer: Medicare PPO | Attending: Emergency Medicine | Admitting: Emergency Medicine

## 2014-01-04 ENCOUNTER — Encounter (HOSPITAL_BASED_OUTPATIENT_CLINIC_OR_DEPARTMENT_OTHER): Payer: Self-pay

## 2014-01-04 DIAGNOSIS — Z7982 Long term (current) use of aspirin: Secondary | ICD-10-CM | POA: Diagnosis not present

## 2014-01-04 DIAGNOSIS — I10 Essential (primary) hypertension: Secondary | ICD-10-CM | POA: Diagnosis not present

## 2014-01-04 DIAGNOSIS — Z8619 Personal history of other infectious and parasitic diseases: Secondary | ICD-10-CM | POA: Diagnosis not present

## 2014-01-04 DIAGNOSIS — M1990 Unspecified osteoarthritis, unspecified site: Secondary | ICD-10-CM | POA: Diagnosis not present

## 2014-01-04 DIAGNOSIS — T783XXA Angioneurotic edema, initial encounter: Secondary | ICD-10-CM | POA: Diagnosis not present

## 2014-01-04 DIAGNOSIS — Z720 Tobacco use: Secondary | ICD-10-CM | POA: Insufficient documentation

## 2014-01-04 DIAGNOSIS — G8929 Other chronic pain: Secondary | ICD-10-CM | POA: Diagnosis not present

## 2014-01-04 DIAGNOSIS — K219 Gastro-esophageal reflux disease without esophagitis: Secondary | ICD-10-CM | POA: Insufficient documentation

## 2014-01-04 DIAGNOSIS — T465X5A Adverse effect of other antihypertensive drugs, initial encounter: Secondary | ICD-10-CM | POA: Insufficient documentation

## 2014-01-04 DIAGNOSIS — L509 Urticaria, unspecified: Secondary | ICD-10-CM | POA: Diagnosis present

## 2014-01-04 DIAGNOSIS — G4700 Insomnia, unspecified: Secondary | ICD-10-CM | POA: Insufficient documentation

## 2014-01-04 DIAGNOSIS — Z7951 Long term (current) use of inhaled steroids: Secondary | ICD-10-CM | POA: Insufficient documentation

## 2014-01-04 DIAGNOSIS — E119 Type 2 diabetes mellitus without complications: Secondary | ICD-10-CM | POA: Diagnosis not present

## 2014-01-04 DIAGNOSIS — Z7952 Long term (current) use of systemic steroids: Secondary | ICD-10-CM | POA: Insufficient documentation

## 2014-01-04 DIAGNOSIS — Z79899 Other long term (current) drug therapy: Secondary | ICD-10-CM | POA: Diagnosis not present

## 2014-01-04 DIAGNOSIS — T7840XA Allergy, unspecified, initial encounter: Secondary | ICD-10-CM

## 2014-01-04 DIAGNOSIS — J45901 Unspecified asthma with (acute) exacerbation: Secondary | ICD-10-CM | POA: Diagnosis not present

## 2014-01-04 DIAGNOSIS — T464X5A Adverse effect of angiotensin-converting-enzyme inhibitors, initial encounter: Secondary | ICD-10-CM

## 2014-01-04 HISTORY — DX: Other chronic pain: G89.29

## 2014-01-04 HISTORY — DX: Dorsalgia, unspecified: M54.9

## 2014-01-04 MED ORDER — METHYLPREDNISOLONE SODIUM SUCC 125 MG IJ SOLR
80.0000 mg | Freq: Once | INTRAMUSCULAR | Status: AC
  Administered 2014-01-04: 80 mg via INTRAMUSCULAR
  Filled 2014-01-04: qty 2

## 2014-01-04 MED ORDER — DIPHENHYDRAMINE HCL 50 MG/ML IJ SOLN
25.0000 mg | Freq: Once | INTRAMUSCULAR | Status: AC
  Administered 2014-01-04: 25 mg via INTRAMUSCULAR
  Filled 2014-01-04: qty 1

## 2014-01-04 NOTE — ED Notes (Addendum)
C/o hives x 1 month-c/o wheezing and swelling to bottom lip x today-was seen at Urgent Care yesterday-no resp distress noted at this time-pt chart read previous allergy to ace inhibitors that has been removed-pt was started back lisinopril last friday

## 2014-01-04 NOTE — ED Provider Notes (Signed)
CSN: [id number]     Arrival date & time 01/04/14  2207 History  This chart was scribed for Jodi Dunnavant Alfonso Patten, MD by Einar Pheasant, ED Scribe. This patient was seen in room MH03/MH03 and the patient's care was started at 11:48 PM.    Chief Complaint  Patient presents with  . Urticaria  . Wheezing  (Patient is a 53 y.o. female presenting with urticaria and wheezing.  Urticaria  Wheezing Severity:  Mild Severity compared to prior episodes:  Less severe Onset quality:  Gradual Duration:  1 day Timing:  Intermittent Progression:  Unchanged Chronicity:  New Relieved by:  Nothing Worsened by:  Nothing tried Ineffective treatments:  None tried Associated symptoms: no fever    HPI Comments: Jodi Bell is a 53 y.o. female who presents to the Emergency Department complaining of wheezing that started today. He is also complaining of associated bottom lip and right hand swelling  With onset today and hives for past 1 month. Pt was seen at urgent care yesterday but no respiratory distress was noted.  Pt states that she was recently started on Lisinopril the past 3 days. She states that she is also on prednisone daily for her hx of arthritis. Pt states that she has a scheduled appointment with an allergist on 12/9. Denies any fever, chills, nausea, emesis, or SOB.   Past Medical History  Diagnosis Date  . Essential hypertension   . Eczema   . Asthma   . Diabetes mellitus   . Chronic insomnia   . Arthritis   . Candida infection   . GERD (gastroesophageal reflux disease)   . Back pain, chronic    Past Surgical History  Procedure Laterality Date  . Cesarean section    . Tubal ligation     Family History  Problem Relation Age of Onset  . Lung cancer Mother   . Diabetes Sister   . Heart disease Sister   . Thyroid cancer      1/2 sister  . Heart disease      1/2 sister   History  Substance Use Topics  . Smoking status: Current Every Day Smoker  . Smokeless tobacco: Not  on file  . Alcohol Use: Yes   OB History    No data available     Review of Systems  Constitutional: Negative for fever.  HENT: Positive for facial swelling.   Respiratory: Positive for wheezing.   All other systems reviewed and are negative.     Allergies  Lexapro  Home Medications   Prior to Admission medications   Medication Sig Start Date End Date Taking? Authorizing Provider  albuterol (PROVENTIL HFA;VENTOLIN HFA) 108 (90 BASE) MCG/ACT inhaler Inhale 2 puffs into the lungs every 6 (six) hours as needed. For shortness of breath.    Historical Provider, MD  albuterol (PROVENTIL) (2.5 MG/3ML) 0.083% nebulizer solution Take 2.5 mg by nebulization every 6 (six) hours as needed. For shortness of breath.    Historical Provider, MD  allopurinol (ZYLOPRIM) 100 MG tablet Take 100 mg by mouth daily.    Historical Provider, MD  ALPRAZolam (XANAX XR) 1 MG 24 hr tablet Take 1 tablet (1 mg total) by mouth daily. At bedtime 12/03/13   Leandrew Koyanagi, MD  amLODipine (NORVASC) 10 MG tablet Take 10 mg by mouth daily.    Historical Provider, MD  aspirin EC 81 MG tablet Take 81 mg by mouth daily.    Historical Provider, MD  beclomethasone (QVAR) 80 MCG/ACT inhaler  Inhale 2 puffs into the lungs 2 (two) times daily. For 1 month 12/03/13   Leandrew Koyanagi, MD  bisoprolol (ZEBETA) 10 MG tablet Take 20 mg by mouth daily.    Historical Provider, MD  cetirizine (ZYRTEC) 10 MG tablet Take 1 tablet (10 mg total) by mouth 2 (two) times daily. 12/03/13   Leandrew Koyanagi, MD  chlorthalidone (HYGROTON) 25 MG tablet Take 25 mg by mouth daily.    Historical Provider, MD  cyclobenzaprine (FLEXERIL) 10 MG tablet Take 10 mg by mouth daily as needed for muscle spasms.    Historical Provider, MD  diclofenac sodium (VOLTAREN) 1 % GEL Apply 2 g topically daily as needed. For joint pain    Historical Provider, MD  diphenhydrAMINE (BENADRYL) 25 mg capsule Take 25 mg by mouth every 6 (six) hours as needed for  allergies.    Historical Provider, MD  EPINEPHrine (EPIPEN) 0.3 mg/0.3 mL SOAJ injection Inject 0.3 mLs (0.3 mg total) into the muscle once. 09/30/12   Thao P Le, DO  esomeprazole (NEXIUM) 20 MG capsule Take 20 mg by mouth daily before breakfast.    Historical Provider, MD  ferrous sulfate 325 (65 FE) MG tablet Take 325 mg by mouth See admin instructions. Take on Tuesdays and Thursdays    Historical Provider, MD  fluticasone (FLONASE) 50 MCG/ACT nasal spray Place 1 spray into both nostrils daily as needed for allergies or rhinitis.    Historical Provider, MD  folic acid (FOLVITE) 1 MG tablet Take 0.4 mg by mouth daily.    Historical Provider, MD  gabapentin (NEURONTIN) 100 MG capsule Take 100-200 mg by mouth 3 (three) times daily. Take 100 MG twice daily, and 200 MG at bedtime.    Historical Provider, MD  glyBURIDE (DIABETA) 2.5 MG tablet Take 2.5-7.5 mg by mouth 2 (two) times daily with a meal. Take 7.5 MG in the morning and 2.5 MG at night.    Historical Provider, MD  hydrOXYzine (VISTARIL) 25 MG capsule Take 25 mg by mouth daily as needed for anxiety.    Historical Provider, MD  lisinopril (PRINIVIL,ZESTRIL) 40 MG tablet Take 40 mg by mouth daily.    Historical Provider, MD  metFORMIN (GLUCOPHAGE) 1000 MG tablet Take 1,000 mg by mouth 2 (two) times daily with a meal.    Historical Provider, MD  methotrexate 25 MG/ML SOLN Inject 15 mg into the vein every 7 (seven) days. Inject on Saturdays. 15 MG = 0.6 mL    Historical Provider, MD  montelukast (SINGULAIR) 10 MG tablet Take 10 mg by mouth at bedtime.    Historical Provider, MD  oxyCODONE (ROXICODONE) 15 MG immediate release tablet Take 15 mg by mouth 2 (two) times daily as needed. For pain.    Historical Provider, MD  predniSONE (DELTASONE) 5 MG tablet Take 7.5 mg by mouth daily.    Historical Provider, MD  triamcinolone ointment (KENALOG) 0.1 % Apply 1 application topically daily as needed. For rash    Historical Provider, MD  zolpidem (AMBIEN) 10  MG tablet Take 10 mg by mouth at bedtime as needed for sleep.    Historical Provider, MD   Triage Vitals:BP 143/83 mmHg  Pulse 88  Temp(Src) 98.6 F (37 C) (Oral)  Resp 20  Ht 5\' 2"  (1.575 m)  Wt 198 lb (89.812 kg)  BMI 36.21 kg/m2  SpO2 94%  Physical Exam  Constitutional: She is oriented to person, place, and time. She appears well-developed and well-nourished. No distress.  HENT:  Head:  Normocephalic and atraumatic.  Right Ear: External ear normal.  Left Ear: External ear normal.  Nose: Nose normal.  Mouth/Throat: Oropharynx is clear and moist.  Swelling of the lower lip noted. No swelling of the tongue or uvula.   Eyes: Conjunctivae are normal. Pupils are equal, round, and reactive to light. Right eye exhibits no discharge. Left eye exhibits no discharge.  Normal appearance  Neck: Normal range of motion. Neck supple.  Cardiovascular: Normal rate, regular rhythm and normal heart sounds.  Exam reveals no gallop and no friction rub.   No murmur heard. Pulmonary/Chest: Effort normal and breath sounds normal. No stridor. No respiratory distress. She has no wheezes. She has no rhonchi. She has no rales.  Abdominal: Soft. She exhibits no distension. There is no tenderness.  Musculoskeletal: Normal range of motion. She exhibits no edema or tenderness.  Neurological: She is alert and oriented to person, place, and time.  Skin: Skin is warm and dry.  Psychiatric: She has a normal mood and affect. Her behavior is normal. Thought content normal.  Nursing note and vitals reviewed.   ED Course  Procedures (including critical care time)  DIAGNOSTIC STUDIES: Oxygen Saturation is 94% on RA, low by my interpretation.    COORDINATION OF CARE: 11:54 PM- Will order a shot of Benadryl. Pt advised of plan for treatment and pt agrees.   Labs Review Labs Reviewed - No data to display  Imaging Review No results found.   EKG Interpretation None      MDM   Final diagnoses:  None     Hives have been ongoing and there is none at the present time.  Lisinopril restarted 3 days ago and is certainly cause of angioedema.  Is scheduled to see Maryanna Shape allergy December 9.  Stop Lisinopril and call doctor to list as allergy.  Medrol dose pack for hives, benadryl Q 6 hrs.  Call 911 for worsening swelling  I personally performed the services described in this documentation, which was scribed in my presence. The recorded information has been reviewed and is accurate.    Carlisle Beers, MD 01/05/14 [phone]

## 2014-01-05 ENCOUNTER — Encounter (HOSPITAL_BASED_OUTPATIENT_CLINIC_OR_DEPARTMENT_OTHER): Payer: Self-pay | Admitting: Emergency Medicine

## 2014-01-05 MED ORDER — METHYLPREDNISOLONE 4 MG PO KIT: PACK | ORAL | Status: DC

## 2014-02-07 ENCOUNTER — Emergency Department (HOSPITAL_BASED_OUTPATIENT_CLINIC_OR_DEPARTMENT_OTHER)
Admission: EM | Admit: 2014-02-07 | Discharge: 2014-02-08 | Disposition: A | Discharge status: Home / Self Care | Payer: Medicare PPO | Attending: Emergency Medicine | Admitting: Emergency Medicine

## 2014-02-07 ENCOUNTER — Encounter (HOSPITAL_BASED_OUTPATIENT_CLINIC_OR_DEPARTMENT_OTHER): Payer: Self-pay | Admitting: *Deleted

## 2014-02-07 DIAGNOSIS — Z79899 Other long term (current) drug therapy: Secondary | ICD-10-CM | POA: Insufficient documentation

## 2014-02-07 DIAGNOSIS — Z720 Tobacco use: Secondary | ICD-10-CM | POA: Diagnosis not present

## 2014-02-07 DIAGNOSIS — J45901 Unspecified asthma with (acute) exacerbation: Secondary | ICD-10-CM | POA: Diagnosis not present

## 2014-02-07 DIAGNOSIS — Z7982 Long term (current) use of aspirin: Secondary | ICD-10-CM | POA: Insufficient documentation

## 2014-02-07 DIAGNOSIS — E119 Type 2 diabetes mellitus without complications: Secondary | ICD-10-CM | POA: Diagnosis not present

## 2014-02-07 DIAGNOSIS — G4700 Insomnia, unspecified: Secondary | ICD-10-CM | POA: Insufficient documentation

## 2014-02-07 DIAGNOSIS — I10 Essential (primary) hypertension: Secondary | ICD-10-CM | POA: Diagnosis not present

## 2014-02-07 DIAGNOSIS — Z872 Personal history of diseases of the skin and subcutaneous tissue: Secondary | ICD-10-CM | POA: Insufficient documentation

## 2014-02-07 DIAGNOSIS — R05 Cough: Secondary | ICD-10-CM | POA: Diagnosis present

## 2014-02-07 DIAGNOSIS — J069 Acute upper respiratory infection, unspecified: Secondary | ICD-10-CM | POA: Insufficient documentation

## 2014-02-07 DIAGNOSIS — K219 Gastro-esophageal reflux disease without esophagitis: Secondary | ICD-10-CM | POA: Insufficient documentation

## 2014-02-07 DIAGNOSIS — Z8619 Personal history of other infectious and parasitic diseases: Secondary | ICD-10-CM | POA: Insufficient documentation

## 2014-02-07 DIAGNOSIS — Z7952 Long term (current) use of systemic steroids: Secondary | ICD-10-CM | POA: Insufficient documentation

## 2014-02-07 DIAGNOSIS — M546 Pain in thoracic spine: Secondary | ICD-10-CM | POA: Insufficient documentation

## 2014-02-07 DIAGNOSIS — M1990 Unspecified osteoarthritis, unspecified site: Secondary | ICD-10-CM | POA: Insufficient documentation

## 2014-02-07 DIAGNOSIS — M25512 Pain in left shoulder: Secondary | ICD-10-CM | POA: Diagnosis not present

## 2014-02-07 DIAGNOSIS — R059 Cough, unspecified: Secondary | ICD-10-CM

## 2014-02-07 DIAGNOSIS — G8929 Other chronic pain: Secondary | ICD-10-CM | POA: Diagnosis not present

## 2014-02-07 NOTE — ED Notes (Signed)
Pt c/o URi symptoms  x 2 weeks now c/o upper back pain with deep breath

## 2014-02-07 NOTE — ED Notes (Signed)
Cough, congestion x 2 weeks with back pain onset today

## 2014-02-08 ENCOUNTER — Emergency Department (HOSPITAL_BASED_OUTPATIENT_CLINIC_OR_DEPARTMENT_OTHER): Payer: Medicare PPO

## 2014-02-08 MED ORDER — GI COCKTAIL ~~LOC~~
30.0000 mL | Freq: Once | ORAL | Status: AC
  Administered 2014-02-08: 30 mL via ORAL
  Filled 2014-02-08: qty 30

## 2014-02-08 MED ORDER — IPRATROPIUM BROMIDE 0.02 % IN SOLN
0.5000 mg | Freq: Once | RESPIRATORY_TRACT | Status: DC
  Filled 2014-02-08: qty 2.5

## 2014-02-08 MED ORDER — ALBUTEROL SULFATE (2.5 MG/3ML) 0.083% IN NEBU
5.0000 mg | INHALATION_SOLUTION | Freq: Once | RESPIRATORY_TRACT | Status: DC
  Filled 2014-02-08: qty 6

## 2014-02-08 MED ORDER — HYDROMORPHONE HCL 1 MG/ML IJ SOLN
2.0000 mg | Freq: Once | INTRAMUSCULAR | Status: AC
  Administered 2014-02-08: 2 mg via INTRAMUSCULAR
  Filled 2014-02-08: qty 2

## 2014-02-08 MED ORDER — ALBUTEROL SULFATE (2.5 MG/3ML) 0.083% IN NEBU
2.5000 mg | INHALATION_SOLUTION | Freq: Once | RESPIRATORY_TRACT | Status: AC
  Administered 2014-02-08: 2.5 mg via RESPIRATORY_TRACT

## 2014-02-08 MED ORDER — IPRATROPIUM-ALBUTEROL 0.5-2.5 (3) MG/3ML IN SOLN
3.0000 mL | RESPIRATORY_TRACT | Status: DC
  Administered 2014-02-08: 3 mL via RESPIRATORY_TRACT

## 2014-02-08 NOTE — ED Notes (Signed)
Pt c/o gerd  Discussed w md  Pt given gi cocktail

## 2014-02-08 NOTE — ED Notes (Signed)
Patient transported to X-ray 

## 2014-02-08 NOTE — ED Provider Notes (Addendum)
CSN: [id number]     Arrival date & time 02/07/14  2336 History   First MD Initiated Contact with Patient 02/08/14 0013     Chief Complaint  Patient presents with  . URI     (Consider location/radiation/quality/duration/timing/severity/associated sxs/prior Treatment) HPI Comments: Patient states her URI symptoms are improving however for the last 2-3 days she's had severe sharp pain in her left shoulder blade area that is worse with deep breathing and coughing. She also ran out of her oxycodone today and does not follow up with her doctor until the fifth. She is under a pain contract she could just not getting any relief today. She denies any shortness of breath  Patient is a 53 y.o. female presenting with URI. The history is provided by the patient.  URI Presenting symptoms: congestion, cough, fever and rhinorrhea   Severity:  Moderate Onset quality:  Gradual Duration:  2 weeks Timing:  Constant Progression:  Improving Chronicity:  New Associated symptoms: wheezing   Associated symptoms: no neck pain     Past Medical History  Diagnosis Date  . Essential hypertension   . Eczema   . Asthma   . Diabetes mellitus   . Chronic insomnia   . Arthritis   . Candida infection   . GERD (gastroesophageal reflux disease)   . Back pain, chronic    Past Surgical History  Procedure Laterality Date  . Cesarean section    . Tubal ligation     Family History  Problem Relation Age of Onset  . Lung cancer Mother   . Diabetes Sister   . Heart disease Sister   . Thyroid cancer      1/2 sister  . Heart disease      1/2 sister   History  Substance Use Topics  . Smoking status: Current Every Day Smoker -- 1.00 packs/day    Types: Cigarettes  . Smokeless tobacco: Not on file  . Alcohol Use: Yes   OB History    No data available     Review of Systems  Constitutional: Positive for fever.  HENT: Positive for congestion and rhinorrhea.   Respiratory: Positive for cough and  wheezing.   Musculoskeletal: Negative for neck pain.  All other systems reviewed and are negative.     Allergies  Lexapro and Lisinopril  Home Medications   Prior to Admission medications   Medication Sig Start Date End Date Taking? Authorizing Provider  promethazine (PHENERGAN) 25 MG tablet Take 25 mg by mouth every 6 (six) hours as needed for nausea or vomiting.   Yes Historical Provider, MD  albuterol (PROVENTIL HFA;VENTOLIN HFA) 108 (90 BASE) MCG/ACT inhaler Inhale 2 puffs into the lungs every 6 (six) hours as needed. For shortness of breath.    Historical Provider, MD  albuterol (PROVENTIL) (2.5 MG/3ML) 0.083% nebulizer solution Take 2.5 mg by nebulization every 6 (six) hours as needed. For shortness of breath.    Historical Provider, MD  allopurinol (ZYLOPRIM) 100 MG tablet Take 100 mg by mouth daily.    Historical Provider, MD  ALPRAZolam (XANAX XR) 1 MG 24 hr tablet Take 1 tablet (1 mg total) by mouth daily. At bedtime 12/03/13   Leandrew Koyanagi, MD  amLODipine (NORVASC) 10 MG tablet Take 10 mg by mouth daily.    Historical Provider, MD  aspirin EC 81 MG tablet Take 81 mg by mouth daily.    Historical Provider, MD  beclomethasone (QVAR) 80 MCG/ACT inhaler Inhale 2 puffs into the lungs 2 (  two) times daily. For 1 month 12/03/13   Leandrew Koyanagi, MD  bisoprolol (ZEBETA) 10 MG tablet Take 20 mg by mouth daily.    Historical Provider, MD  cetirizine (ZYRTEC) 10 MG tablet Take 1 tablet (10 mg total) by mouth 2 (two) times daily. 12/03/13   Leandrew Koyanagi, MD  chlorthalidone (HYGROTON) 25 MG tablet Take 25 mg by mouth daily.    Historical Provider, MD  cyclobenzaprine (FLEXERIL) 10 MG tablet Take 10 mg by mouth daily as needed for muscle spasms.    Historical Provider, MD  diclofenac sodium (VOLTAREN) 1 % GEL Apply 2 g topically daily as needed. For joint pain    Historical Provider, MD  diphenhydrAMINE (BENADRYL) 25 mg capsule Take 25 mg by mouth every 6 (six) hours as needed  for allergies.    Historical Provider, MD  EPINEPHrine (EPIPEN) 0.3 mg/0.3 mL SOAJ injection Inject 0.3 mLs (0.3 mg total) into the muscle once. 09/30/12   Thao P Le, DO  esomeprazole (NEXIUM) 20 MG capsule Take 20 mg by mouth daily before breakfast.    Historical Provider, MD  ferrous sulfate 325 (65 FE) MG tablet Take 325 mg by mouth See admin instructions. Take on Tuesdays and Thursdays    Historical Provider, MD  fluticasone (FLONASE) 50 MCG/ACT nasal spray Place 1 spray into both nostrils daily as needed for allergies or rhinitis.    Historical Provider, MD  folic acid (FOLVITE) 1 MG tablet Take 0.4 mg by mouth daily.    Historical Provider, MD  gabapentin (NEURONTIN) 100 MG capsule Take 100-200 mg by mouth 3 (three) times daily. Take 100 MG twice daily, and 200 MG at bedtime.    Historical Provider, MD  glyBURIDE (DIABETA) 2.5 MG tablet Take 2.5-7.5 mg by mouth 2 (two) times daily with a meal. Take 7.5 MG in the morning and 2.5 MG at night.    Historical Provider, MD  hydrOXYzine (VISTARIL) 25 MG capsule Take 25 mg by mouth daily as needed for anxiety.    Historical Provider, MD  metFORMIN (GLUCOPHAGE) 1000 MG tablet Take 1,000 mg by mouth 2 (two) times daily with a meal.    Historical Provider, MD  methotrexate 25 MG/ML SOLN Inject 15 mg into the vein every 7 (seven) days. Inject on Saturdays. 15 MG = 0.6 mL    Historical Provider, MD  methylPREDNISolone (MEDROL DOSEPAK) 4 MG tablet Use as directed 01/05/14   April K Palumbo-Rasch, MD  montelukast (SINGULAIR) 10 MG tablet Take 10 mg by mouth at bedtime.    Historical Provider, MD  oxyCODONE (ROXICODONE) 15 MG immediate release tablet Take 15 mg by mouth 2 (two) times daily as needed. For pain.    Historical Provider, MD  predniSONE (DELTASONE) 5 MG tablet Take 7.5 mg by mouth daily.    Historical Provider, MD  triamcinolone ointment (KENALOG) 0.1 % Apply 1 application topically daily as needed. For rash    Historical Provider, MD  zolpidem  (AMBIEN) 10 MG tablet Take 10 mg by mouth at bedtime as needed for sleep.    Historical Provider, MD   BP 142/96 mmHg  Pulse 91  Temp(Src) 98.5 F (36.9 C) (Oral)  Resp 18  Ht 5\' 2"  (1.575 m)  Wt 197 lb (89.359 kg)  BMI 36.02 kg/m2  SpO2 98% Physical Exam  Constitutional: She is oriented to person, place, and time. She appears well-developed and well-nourished. She appears distressed.  HENT:  Head: Normocephalic and atraumatic.  Nose: Mucosal edema and rhinorrhea  present.  Eyes: EOM are normal. Pupils are equal, round, and reactive to light.  Cardiovascular: Normal rate, regular rhythm, normal heart sounds and intact distal pulses.  Exam reveals no friction rub.   No murmur heard. Pulmonary/Chest: Effort normal. She has wheezes. She has no rales. She exhibits no tenderness.  Abdominal: Soft. Bowel sounds are normal. She exhibits no distension. There is no tenderness. There is no rebound and no guarding.  Musculoskeletal: Normal range of motion. She exhibits no tenderness.       Back:  No edema  Neurological: She is alert and oriented to person, place, and time. No cranial nerve deficit.  Skin: Skin is warm and dry. No rash noted.  Psychiatric: She has a normal mood and affect. Her behavior is normal.  Nursing note and vitals reviewed.   ED Course  Procedures (including critical care time) Labs Review Labs Reviewed - No data to display  Imaging Review Dg Chest 2 View  02/08/2014   CLINICAL DATA:  Cough  EXAM: CHEST  2 VIEW  COMPARISON:  10/01/2013  FINDINGS: Chronic mild cardiomegaly and aortic tortuosity. There is no edema, consolidation, effusion, or pneumothorax. No acute osseous findings.  IMPRESSION: No active cardiopulmonary disease.   Electronically Signed   By: Jorje Guild M.D.   On: 02/08/2014 00:31     EKG Interpretation None      MDM   Final diagnoses:  URI (upper respiratory infection)  Left-sided thoracic back pain    Pt with symptoms consistent  with viral URI for the last 2 weeks. Now patient is having severe pain with cough and deep breathing in the left scapular region.  Well appearing here.  No signs of breathing difficulty but mild wheezing on exam  No signs of pharyngitis, otitis or abnormal abdominal findings.  Low suspicion for PE as pt is low risk and in the setting of infectious sx and persistent coughing has other reasons for pain.  Low suspicion for cardiac cause as pt has no chest pain/SOB or prior cardiac hx. CXR wnl and pt to return with any further problems.  Patient is under a pain contract and does not see her doctor to the fifth. She was given 1 dose of pain control here. Also given a breathing treatment with improvement of her wheezing. Patient has nebulizer and albuterol inhaler at home.     Blanchie Dessert, MD 02/08/14 [id number]  Blanchie Dessert, MD 02/08/14 [phone]

## 2014-02-10 HISTORY — PX: BACK SURGERY: SHX140

## 2014-09-04 ENCOUNTER — Emergency Department (HOSPITAL_BASED_OUTPATIENT_CLINIC_OR_DEPARTMENT_OTHER)
Admission: EM | Admit: 2014-09-04 | Discharge: 2014-09-05 | Disposition: A | Discharge status: Home / Self Care | Payer: Medicare PPO | Attending: Emergency Medicine | Admitting: Emergency Medicine

## 2014-09-04 ENCOUNTER — Encounter (HOSPITAL_BASED_OUTPATIENT_CLINIC_OR_DEPARTMENT_OTHER): Payer: Self-pay | Admitting: Emergency Medicine

## 2014-09-04 DIAGNOSIS — Z8739 Personal history of other diseases of the musculoskeletal system and connective tissue: Secondary | ICD-10-CM | POA: Diagnosis not present

## 2014-09-04 DIAGNOSIS — R251 Tremor, unspecified: Secondary | ICD-10-CM | POA: Diagnosis not present

## 2014-09-04 DIAGNOSIS — I10 Essential (primary) hypertension: Secondary | ICD-10-CM | POA: Diagnosis not present

## 2014-09-04 DIAGNOSIS — T426X5A Adverse effect of other antiepileptic and sedative-hypnotic drugs, initial encounter: Secondary | ICD-10-CM | POA: Diagnosis not present

## 2014-09-04 DIAGNOSIS — G4700 Insomnia, unspecified: Secondary | ICD-10-CM | POA: Insufficient documentation

## 2014-09-04 DIAGNOSIS — K219 Gastro-esophageal reflux disease without esophagitis: Secondary | ICD-10-CM | POA: Insufficient documentation

## 2014-09-04 DIAGNOSIS — Z79899 Other long term (current) drug therapy: Secondary | ICD-10-CM | POA: Insufficient documentation

## 2014-09-04 DIAGNOSIS — Z7951 Long term (current) use of inhaled steroids: Secondary | ICD-10-CM | POA: Insufficient documentation

## 2014-09-04 DIAGNOSIS — E119 Type 2 diabetes mellitus without complications: Secondary | ICD-10-CM | POA: Insufficient documentation

## 2014-09-04 DIAGNOSIS — J45909 Unspecified asthma, uncomplicated: Secondary | ICD-10-CM | POA: Insufficient documentation

## 2014-09-04 DIAGNOSIS — T50905A Adverse effect of unspecified drugs, medicaments and biological substances, initial encounter: Secondary | ICD-10-CM

## 2014-09-04 DIAGNOSIS — Z872 Personal history of diseases of the skin and subcutaneous tissue: Secondary | ICD-10-CM | POA: Insufficient documentation

## 2014-09-04 DIAGNOSIS — G8929 Other chronic pain: Secondary | ICD-10-CM | POA: Diagnosis not present

## 2014-09-04 DIAGNOSIS — Z8619 Personal history of other infectious and parasitic diseases: Secondary | ICD-10-CM | POA: Insufficient documentation

## 2014-09-04 DIAGNOSIS — Z87891 Personal history of nicotine dependence: Secondary | ICD-10-CM | POA: Diagnosis not present

## 2014-09-04 MED ORDER — ACETAMINOPHEN 325 MG PO TABS
650.0000 mg | ORAL_TABLET | Freq: Once | ORAL | Status: AC
  Administered 2014-09-04: 650 mg via ORAL
  Filled 2014-09-04: qty 2

## 2014-09-04 MED ORDER — DIAZEPAM 5 MG PO TABS
10.0000 mg | ORAL_TABLET | Freq: Once | ORAL | Status: DC
  Filled 2014-09-04: qty 2

## 2014-09-04 NOTE — ED Notes (Signed)
Pt states that she started taking neurotin again on Thursday, on Saturday she developed tremors, swelling feet and fever. Pt was previously on medication,  she ran out of medication for 2 weeks then restarted back on thur. No previous problems with medication

## 2014-09-04 NOTE — ED Provider Notes (Signed)
CSN: [id number]     Arrival date & time 09/04/14  2114 History   This chart was scribed for Jodi Rosser, MD by Rayna Sexton, ED scribe. This patient was seen in room MH01/MH01 and the patient's care was started at 11:49 PM.   Chief Complaint  Patient presents with  . Tremors   HPI HPI Comments: Jodi Bell is a 54 y.o. female who presents to the Emergency Department complaining of constant, mild, tremors to her extremities with onset 1 day ago. Pt notes that she stopped taking gabapentin 2 weeks ago and began taking 900 mg 2x per day beginning 4 days ago with her last dose being 24 hours ago and is concerned her symptoms are a result of the medication. She notes associated swelling of her bilateral feet and a mild fever. Pt further notes improvement of her tremors since she stopped taking the gabapentin. She also notes a hx of serotonin syndrome with resulting hand tremors in the past and being admitted to [facility] for treatment. She denies illness symptoms such as nasal congestion, sore throat, cough, dyspnea, nausea, vomiting, diarrhea or dysuria.  Past Medical History  Diagnosis Date  . Essential hypertension   . Eczema   . Asthma   . Diabetes mellitus   . Chronic insomnia   . Arthritis   . Candida infection   . GERD (gastroesophageal reflux disease)   . Back pain, chronic    Past Surgical History  Procedure Laterality Date  . Cesarean section    . Tubal ligation     Family History  Problem Relation Age of Onset  . Lung cancer Mother   . Diabetes Sister   . Heart disease Sister   . Thyroid cancer      1/2 sister  . Heart disease      1/2 sister   History  Substance Use Topics  . Smoking status: Former Smoker -- 0.00 packs/day    Quit date: 02/03/2014  . Smokeless tobacco: Not on file  . Alcohol Use: Yes   OB History    No data available     Review of Systems  All other systems reviewed and are negative.  Allergies  Adalimumab; Ibuprofen; Leflunomide  and related; Lexapro; Lisinopril; Statins; and Trazodone and nefazodone  Home Medications   Prior to Admission medications   Medication Sig Start Date End Date Taking? Authorizing Provider  albuterol (PROVENTIL) (2.5 MG/3ML) 0.083% nebulizer solution Take 2.5 mg by nebulization every 6 (six) hours as needed. For shortness of breath.   Yes Historical Provider, MD  allopurinol (ZYLOPRIM) 100 MG tablet Take 100 mg by mouth daily.   Yes Historical Provider, MD  amLODipine (NORVASC) 10 MG tablet Take 10 mg by mouth daily.   Yes Historical Provider, MD  bisoprolol (ZEBETA) 10 MG tablet Take 20 mg by mouth daily.   Yes Historical Provider, MD  cetirizine (ZYRTEC) 10 MG tablet Take 1 tablet (10 mg total) by mouth 2 (two) times daily. 12/03/13  Yes Leandrew Koyanagi, MD  chlorthalidone (HYGROTON) 25 MG tablet Take 25 mg by mouth daily.   Yes Historical Provider, MD  diclofenac sodium (VOLTAREN) 1 % GEL Apply 2 g topically daily as needed. For joint pain   Yes Historical Provider, MD  esomeprazole (NEXIUM) 20 MG capsule Take 20 mg by mouth daily before breakfast.   Yes Historical Provider, MD  ferrous sulfate 325 (65 FE) MG tablet Take 325 mg by mouth See admin instructions. Take on Tuesdays and  Thursdays   Yes Historical Provider, MD  fluticasone (FLONASE) 50 MCG/ACT nasal spray Place 1 spray into both nostrils daily as needed for allergies or rhinitis.   Yes Historical Provider, MD  folic acid (FOLVITE) 1 MG tablet Take 0.4 mg by mouth daily.   Yes Historical Provider, MD  gabapentin (NEURONTIN) 100 MG capsule Take 100-200 mg by mouth 3 (three) times daily. Take 100 MG twice daily, and 200 MG at bedtime.   Yes Historical Provider, MD  glyBURIDE (DIABETA) 2.5 MG tablet Take 2.5-7.5 mg by mouth 2 (two) times daily with a meal. Take 7.5 MG in the morning and 2.5 MG at night.   Yes Historical Provider, MD  hydrOXYzine (VISTARIL) 25 MG capsule Take 25 mg by mouth daily as needed for anxiety.   Yes Historical  Provider, MD  montelukast (SINGULAIR) 10 MG tablet Take 10 mg by mouth at bedtime.   Yes Historical Provider, MD  oxyCODONE (ROXICODONE) 15 MG immediate release tablet Take 15 mg by mouth 2 (two) times daily as needed. For pain.   Yes Historical Provider, MD  predniSONE (DELTASONE) 5 MG tablet Take 7.5 mg by mouth daily.   Yes Historical Provider, MD  promethazine (PHENERGAN) 25 MG tablet Take 25 mg by mouth every 6 (six) hours as needed for nausea or vomiting.   Yes Historical Provider, MD  zolpidem (AMBIEN) 10 MG tablet Take 10 mg by mouth at bedtime as needed for sleep.   Yes Historical Provider, MD  ALPRAZolam (XANAX XR) 1 MG 24 hr tablet Take 1 tablet (1 mg total) by mouth daily. At bedtime 12/03/13   Leandrew Koyanagi, MD  beclomethasone (QVAR) 80 MCG/ACT inhaler Inhale 2 puffs into the lungs 2 (two) times daily. For 1 month 12/03/13   Leandrew Koyanagi, MD  cyclobenzaprine (FLEXERIL) 10 MG tablet Take 10 mg by mouth daily as needed for muscle spasms.    Historical Provider, MD  EPINEPHrine (EPIPEN) 0.3 mg/0.3 mL SOAJ injection Inject 0.3 mLs (0.3 mg total) into the muscle once. 09/30/12   Thao P Le, DO  triamcinolone ointment (KENALOG) 0.1 % Apply 1 application topically daily as needed. For rash    Historical Provider, MD   BP 159/71 mmHg  Pulse 96  Temp(Src) 101.1 F (38.4 C) (Oral)  Resp 20  Ht 5' 2.75" (1.594 m)  Wt 192 lb (87.091 kg)  BMI 34.28 kg/m2  SpO2 94%   Physical Exam  General: Well-developed, well-nourished female in no acute distress; appearance consistent with age of record HENT: normocephalic; atraumatic; pharynx normal  Eyes: pupils equal, round and reactive to light; extraocular muscles intact Neck: supple Heart: regular rate and rhythm; no murmurs, rubs or gallops Lungs: clear to auscultation bilaterally Abdomen: soft; nondistended; nontender; no masses or hepatosplenomegaly; bowel sounds present Extremities: No deformity; full range of motion; pulses normal;  +1 edema of lower legs Neurologic: Awake, alert and oriented; motor function intact in all extremities and symmetric; no facial droop; no hyperflexia; no muscle rigidity; no obvious tremor Skin: Warm and dry Psychiatric: Normal mood and affect  ED Course  Procedures  DIAGNOSTIC STUDIES: Oxygen Saturation is 94% on RA, adequate by my interpretation.    COORDINATION OF CARE: 11:54 PM Discussed treatment plan with pt at bedside and pt agreed to plan.  MDM   Nursing notes and vitals signs, including pulse oximetry, reviewed.  Summary of this visit's results, reviewed by myself:  Labs:  Results for orders placed or performed during the hospital encounter of  09/04/14 (from the past 24 hour(s))  Urinalysis, Routine w reflex microscopic (not at [facility])     Status: Abnormal   Collection Time: 09/05/14 12:13 AM  Result Value Ref Range   Color, Urine YELLOW YELLOW   APPearance CLEAR CLEAR   Specific Gravity, Urine 1.006 1.005 - 1.030   pH 5.5 5.0 - 8.0   Glucose, UA NEGATIVE NEGATIVE mg/dL   Hgb urine dipstick NEGATIVE NEGATIVE   Bilirubin Urine NEGATIVE NEGATIVE   Ketones, ur NEGATIVE NEGATIVE mg/dL   Protein, ur 100 (A) NEGATIVE mg/dL   Urobilinogen, UA 0.2 0.0 - 1.0 mg/dL   Nitrite NEGATIVE NEGATIVE   Leukocytes, UA NEGATIVE NEGATIVE  Urine microscopic-add on     Status: None   Collection Time: 09/05/14 12:13 AM  Result Value Ref Range   Squamous Epithelial / LPF RARE RARE   Bacteria, UA RARE RARE  CBC with Differential/Platelet     Status: Abnormal   Collection Time: 09/05/14 12:15 AM  Result Value Ref Range   WBC 6.5 4.0 - 10.5 K/uL   RBC 3.11 (L) 3.87 - 5.11 MIL/uL   Hemoglobin 9.1 (L) 12.0 - 15.0 g/dL   HCT 28.3 (L) 36.0 - 46.0 %   MCV 91.0 78.0 - 100.0 fL   MCH 29.3 26.0 - 34.0 pg   MCHC 32.2 30.0 - 36.0 g/dL   RDW 15.8 (H) 11.5 - 15.5 %   Platelets 261 150 - 400 K/uL   Neutrophils Relative % 54 43 - 77 %   Neutro Abs 3.6 1.7 - 7.7 K/uL   Lymphocytes Relative 32 12 -  46 %   Lymphs Abs 2.1 0.7 - 4.0 K/uL   Monocytes Relative 11 3 - 12 %   Monocytes Absolute 0.7 0.1 - 1.0 K/uL   Eosinophils Relative 3 0 - 5 %   Eosinophils Absolute 0.2 0.0 - 0.7 K/uL   Basophils Relative 0 0 - 1 %   Basophils Absolute 0.0 0.0 - 0.1 K/uL  Basic metabolic panel     Status: Abnormal   Collection Time: 09/05/14 12:15 AM  Result Value Ref Range   Sodium 134 (L) 135 - 145 mmol/L   Potassium 3.1 (L) 3.5 - 5.1 mmol/L   Chloride 101 101 - 111 mmol/L   CO2 23 22 - 32 mmol/L   Glucose, Bld 166 (H) 65 - 99 mg/dL   BUN 41 (H) 6 - 20 mg/dL   Creatinine, Ser 2.31 (H) 0.44 - 1.00 mg/dL   Calcium 9.4 8.9 - 10.3 mg/dL   GFR calc non Af Amer 23 (L) >60 mL/min   GFR calc Af Amer 26 (L) >60 mL/min   Anion gap 10 5 - 15   1:12 AM Patient symptoms of improved significantly after Valium 5 milligrams orally. We will stop her gabapentin and have her contact her primary care physician concerning restarting it. It is suspicious for a mild serotonin syndrome.  I personally performed the services described in this documentation, which was scribed in my presence. The recorded information has been reviewed and is accurate.    Jodi Rosser, MD 09/05/14 [phone]

## 2014-09-05 LAB — URINALYSIS, ROUTINE W REFLEX MICROSCOPIC
Bilirubin Urine: NEGATIVE
Glucose, UA: NEGATIVE mg/dL
Hgb urine dipstick: NEGATIVE
Ketones, ur: NEGATIVE mg/dL
Leukocytes, UA: NEGATIVE
Nitrite: NEGATIVE
Protein, ur: 100 mg/dL — AB
Specific Gravity, Urine: 1.006 (ref 1.005–1.030)
Urobilinogen, UA: 0.2 mg/dL (ref 0.0–1.0)
pH: 5.5 (ref 5.0–8.0)

## 2014-09-05 LAB — CBC WITH DIFFERENTIAL/PLATELET
Basophils Absolute: 0 10*3/uL (ref 0.0–0.1)
Basophils Relative: 0 % (ref 0–1)
Eosinophils Absolute: 0.2 10*3/uL (ref 0.0–0.7)
Eosinophils Relative: 3 % (ref 0–5)
HCT: 28.3 % — ABNORMAL LOW (ref 36.0–46.0)
Hemoglobin: 9.1 g/dL — ABNORMAL LOW (ref 12.0–15.0)
Lymphocytes Relative: 32 % (ref 12–46)
Lymphs Abs: 2.1 10*3/uL (ref 0.7–4.0)
MCH: 29.3 pg (ref 26.0–34.0)
MCHC: 32.2 g/dL (ref 30.0–36.0)
MCV: 91 fL (ref 78.0–100.0)
Monocytes Absolute: 0.7 10*3/uL (ref 0.1–1.0)
Monocytes Relative: 11 % (ref 3–12)
Neutro Abs: 3.6 10*3/uL (ref 1.7–7.7)
Neutrophils Relative %: 54 % (ref 43–77)
Platelets: 261 10*3/uL (ref 150–400)
RBC: 3.11 MIL/uL — ABNORMAL LOW (ref 3.87–5.11)
RDW: 15.8 % — ABNORMAL HIGH (ref 11.5–15.5)
WBC: 6.5 10*3/uL (ref 4.0–10.5)

## 2014-09-05 LAB — BASIC METABOLIC PANEL WITH GFR
Anion gap: 10 (ref 5–15)
BUN: 41 mg/dL — ABNORMAL HIGH (ref 6–20)
CO2: 23 mmol/L (ref 22–32)
Calcium: 9.4 mg/dL (ref 8.9–10.3)
Chloride: 101 mmol/L (ref 101–111)
Creatinine, Ser: 2.31 mg/dL — ABNORMAL HIGH (ref 0.44–1.00)
GFR calc Af Amer: 26 mL/min — ABNORMAL LOW
GFR calc non Af Amer: 23 mL/min — ABNORMAL LOW
Glucose, Bld: 166 mg/dL — ABNORMAL HIGH (ref 65–99)
Potassium: 3.1 mmol/L — ABNORMAL LOW (ref 3.5–5.1)
Sodium: 134 mmol/L — ABNORMAL LOW (ref 135–145)

## 2014-09-05 LAB — URINE MICROSCOPIC-ADD ON

## 2014-09-05 MED ORDER — DIAZEPAM 5 MG PO TABS: 5.0000 mg | ORAL_TABLET | Freq: Three times a day (TID) | ORAL | Status: DC | PRN

## 2014-09-05 MED ORDER — DIAZEPAM 5 MG PO TABS
5.0000 mg | ORAL_TABLET | Freq: Once | ORAL | Status: AC
  Administered 2014-09-05: 5 mg via ORAL

## 2015-03-14 ENCOUNTER — Emergency Department (HOSPITAL_BASED_OUTPATIENT_CLINIC_OR_DEPARTMENT_OTHER)
Admission: EM | Admit: 2015-03-14 | Discharge: 2015-03-14 | Disposition: A | Discharge status: Home / Self Care | Payer: Medicare PPO | Attending: Emergency Medicine | Admitting: Emergency Medicine

## 2015-03-14 ENCOUNTER — Emergency Department (HOSPITAL_BASED_OUTPATIENT_CLINIC_OR_DEPARTMENT_OTHER): Payer: Medicare PPO

## 2015-03-14 ENCOUNTER — Encounter (HOSPITAL_BASED_OUTPATIENT_CLINIC_OR_DEPARTMENT_OTHER): Payer: Self-pay | Admitting: Emergency Medicine

## 2015-03-14 DIAGNOSIS — I10 Essential (primary) hypertension: Secondary | ICD-10-CM | POA: Diagnosis not present

## 2015-03-14 DIAGNOSIS — Z7951 Long term (current) use of inhaled steroids: Secondary | ICD-10-CM | POA: Insufficient documentation

## 2015-03-14 DIAGNOSIS — R079 Chest pain, unspecified: Secondary | ICD-10-CM | POA: Diagnosis not present

## 2015-03-14 DIAGNOSIS — J441 Chronic obstructive pulmonary disease with (acute) exacerbation: Secondary | ICD-10-CM | POA: Diagnosis not present

## 2015-03-14 DIAGNOSIS — G8929 Other chronic pain: Secondary | ICD-10-CM | POA: Insufficient documentation

## 2015-03-14 DIAGNOSIS — M1990 Unspecified osteoarthritis, unspecified site: Secondary | ICD-10-CM | POA: Insufficient documentation

## 2015-03-14 DIAGNOSIS — Z79899 Other long term (current) drug therapy: Secondary | ICD-10-CM | POA: Diagnosis not present

## 2015-03-14 DIAGNOSIS — R51 Headache: Secondary | ICD-10-CM | POA: Insufficient documentation

## 2015-03-14 DIAGNOSIS — Z872 Personal history of diseases of the skin and subcutaneous tissue: Secondary | ICD-10-CM | POA: Diagnosis not present

## 2015-03-14 DIAGNOSIS — Z87891 Personal history of nicotine dependence: Secondary | ICD-10-CM | POA: Diagnosis not present

## 2015-03-14 DIAGNOSIS — K219 Gastro-esophageal reflux disease without esophagitis: Secondary | ICD-10-CM | POA: Diagnosis not present

## 2015-03-14 DIAGNOSIS — G4700 Insomnia, unspecified: Secondary | ICD-10-CM | POA: Diagnosis not present

## 2015-03-14 DIAGNOSIS — Z8619 Personal history of other infectious and parasitic diseases: Secondary | ICD-10-CM | POA: Insufficient documentation

## 2015-03-14 DIAGNOSIS — E119 Type 2 diabetes mellitus without complications: Secondary | ICD-10-CM | POA: Diagnosis not present

## 2015-03-14 DIAGNOSIS — E669 Obesity, unspecified: Secondary | ICD-10-CM | POA: Diagnosis not present

## 2015-03-14 DIAGNOSIS — R062 Wheezing: Secondary | ICD-10-CM | POA: Diagnosis present

## 2015-03-14 HISTORY — DX: Obesity, unspecified: E66.9

## 2015-03-14 MED ORDER — ALBUTEROL SULFATE (2.5 MG/3ML) 0.083% IN NEBU
2.5000 mg | INHALATION_SOLUTION | Freq: Once | RESPIRATORY_TRACT | Status: AC
Start: 2015-03-14 — End: 2015-03-14
  Administered 2015-03-14: 2.5 mg via RESPIRATORY_TRACT
  Filled 2015-03-14: qty 3

## 2015-03-14 MED ORDER — DOXYCYCLINE HYCLATE 100 MG PO CAPS: 100.0000 mg | ORAL_CAPSULE | Freq: Two times a day (BID) | ORAL | Status: DC

## 2015-03-14 MED ORDER — BENZONATATE 100 MG PO CAPS
100.0000 mg | ORAL_CAPSULE | Freq: Once | ORAL | Status: AC
  Administered 2015-03-14: 100 mg via ORAL
  Filled 2015-03-14: qty 1

## 2015-03-14 MED ORDER — PREDNISONE 50 MG PO TABS
60.0000 mg | ORAL_TABLET | Freq: Once | ORAL | Status: AC
  Administered 2015-03-14: 60 mg via ORAL
  Filled 2015-03-14: qty 1

## 2015-03-14 MED ORDER — BENZONATATE 100 MG PO CAPS: 100.0000 mg | ORAL_CAPSULE | Freq: Three times a day (TID) | ORAL | Status: DC

## 2015-03-14 MED ORDER — PREDNISONE 20 MG PO TABS: 20.0000 mg | ORAL_TABLET | Freq: Two times a day (BID) | ORAL | Status: DC

## 2015-03-14 NOTE — ED Provider Notes (Addendum)
CSN: [id number]     Arrival date & time 03/14/15  1749 History  By signing my name below, I, Soijett Blue, attest that this documentation has been prepared under the direction and in the presence of Tanna Furry, MD. Electronically Signed: Soijett Blue, ED Scribe. 03/14/2015. 7:22 PM.  Chief Complaint  Patient presents with  . Wheezing      The history is provided by the patient. No language interpreter was used.    Jodi Bell is a 55 y.o. female with a medical hx of HTN, asthma, DM, who presents to the Emergency Department complaining of constant wheezing onset 5 days. Pt notes that she has had pneumonia several times in the past and she is concerned for that. Pt reports that she did get a flu vaccine this year. Pt is having associated symptoms of SOB, bilateral CP worsened with deep breathing, resolved fever of 102 2 days ago, HA, and nasal congestion. She reports that her bilateral CP is alleviated with rubbing her chest. She notes that she has not tried any medications for the relief of her symptoms. She denies cough, sore throat, abdominal pain, n/v and any other symptoms. Pt notes that she quit smoking 1 year ago. Pt reports that she takes oxycodone for her chronic back pain.    Past Medical History  Diagnosis Date  . Essential hypertension   . Eczema   . Asthma   . Diabetes mellitus   . Chronic insomnia   . Arthritis   . Candida infection   . GERD (gastroesophageal reflux disease)   . Back pain, chronic   . Obesity    Past Surgical History  Procedure Laterality Date  . Cesarean section    . Tubal ligation     Family History  Problem Relation Age of Onset  . Lung cancer Mother   . Diabetes Sister   . Heart disease Sister   . Thyroid cancer      1/2 sister  . Heart disease      1/2 sister   Social History  Substance Use Topics  . Smoking status: Former Smoker -- 0.00 packs/day    Quit date: 02/03/2014  . Smokeless tobacco: None  . Alcohol Use: Yes      Comment: occ   OB History    No data available     Review of Systems  Constitutional: Positive for fever (resolved). Negative for chills, diaphoresis, appetite change and fatigue.  HENT: Positive for congestion. Negative for mouth sores, sore throat and trouble swallowing.   Eyes: Negative for visual disturbance.  Respiratory: Positive for shortness of breath. Negative for cough, chest tightness and wheezing.   Cardiovascular: Positive for chest pain.  Gastrointestinal: Negative for nausea, vomiting, abdominal pain, diarrhea and abdominal distention.  Endocrine: Negative for polydipsia, polyphagia and polyuria.  Genitourinary: Negative for dysuria, frequency and hematuria.  Musculoskeletal: Negative for gait problem.  Skin: Negative for color change, pallor and rash.  Neurological: Positive for headaches. Negative for dizziness, syncope and light-headedness.  Hematological: Does not bruise/bleed easily.  Psychiatric/Behavioral: Negative for behavioral problems and confusion.      Allergies  Adalimumab; Ibuprofen; Leflunomide and related; Lexapro; Lisinopril; Statins; and Trazodone and nefazodone  Home Medications   Prior to Admission medications   Medication Sig Start Date End Date Taking? Authorizing Provider  albuterol (PROVENTIL) (2.5 MG/3ML) 0.083% nebulizer solution Take 2.5 mg by nebulization every 6 (six) hours as needed. For shortness of breath.    Historical Provider, MD  allopurinol (  ZYLOPRIM) 100 MG tablet Take 100 mg by mouth daily.    Historical Provider, MD  amLODipine (NORVASC) 10 MG tablet Take 10 mg by mouth daily.    Historical Provider, MD  benzonatate (TESSALON) 100 MG capsule Take 1 capsule (100 mg total) by mouth every 8 (eight) hours. 03/14/15   Tanna Furry, MD  bisoprolol (ZEBETA) 10 MG tablet Take 20 mg by mouth daily.    Historical Provider, MD  cetirizine (ZYRTEC) 10 MG tablet Take 1 tablet (10 mg total) by mouth 2 (two) times daily. 12/03/13   Leandrew Koyanagi, MD  chlorthalidone (HYGROTON) 25 MG tablet Take 25 mg by mouth daily.    Historical Provider, MD  diazepam (VALIUM) 5 MG tablet Take 1 tablet (5 mg total) by mouth every 8 (eight) hours as needed (tremor). 09/05/14   John Molpus, MD  diclofenac sodium (VOLTAREN) 1 % GEL Apply 2 g topically daily as needed. For joint pain    Historical Provider, MD  doxycycline (VIBRAMYCIN) 100 MG capsule Take 1 capsule (100 mg total) by mouth 2 (two) times daily. 03/14/15   Tanna Furry, MD  EPINEPHrine (EPIPEN) 0.3 mg/0.3 mL SOAJ injection Inject 0.3 mLs (0.3 mg total) into the muscle once. 09/30/12   Thao P Le, DO  esomeprazole (NEXIUM) 20 MG capsule Take 20 mg by mouth daily before breakfast.    Historical Provider, MD  ferrous sulfate 325 (65 FE) MG tablet Take 325 mg by mouth See admin instructions. Take on Tuesdays and Thursdays    Historical Provider, MD  fluticasone (FLONASE) 50 MCG/ACT nasal spray Place 1 spray into both nostrils daily as needed for allergies or rhinitis.    Historical Provider, MD  folic acid (FOLVITE) 1 MG tablet Take 0.4 mg by mouth daily.    Historical Provider, MD  glyBURIDE (DIABETA) 2.5 MG tablet Take 2.5-7.5 mg by mouth 2 (two) times daily with a meal. Take 7.5 MG in the morning and 2.5 MG at night.    Historical Provider, MD  hydrOXYzine (VISTARIL) 25 MG capsule Take 25 mg by mouth daily as needed for anxiety.    Historical Provider, MD  montelukast (SINGULAIR) 10 MG tablet Take 10 mg by mouth at bedtime.    Historical Provider, MD  oxyCODONE (ROXICODONE) 15 MG immediate release tablet Take 15 mg by mouth 2 (two) times daily as needed. For pain.    Historical Provider, MD  predniSONE (DELTASONE) 20 MG tablet Take 1 tablet (20 mg total) by mouth 2 (two) times daily with a meal. 03/14/15   Tanna Furry, MD  promethazine (PHENERGAN) 25 MG tablet Take 25 mg by mouth every 6 (six) hours as needed for nausea or vomiting.    Historical Provider, MD  zolpidem (AMBIEN) 10 MG tablet Take 10  mg by mouth at bedtime as needed for sleep.    Historical Provider, MD   BP 139/81 mmHg  Pulse 89  Temp(Src) 99.1 F (37.3 C) (Oral)  Resp 15  Ht 5' 2.75" (1.594 m)  Wt 194 lb (87.998 kg)  BMI 34.63 kg/m2  SpO2 98% Physical Exam  Constitutional: She is oriented to person, place, and time. She appears well-developed and well-nourished. No distress.  HENT:  Head: Normocephalic.  Eyes: Conjunctivae are normal. Pupils are equal, round, and reactive to light. No scleral icterus.  Neck: Normal range of motion. Neck supple. No thyromegaly present.  Cardiovascular: Normal rate and regular rhythm.  Exam reveals no gallop and no friction rub.   No murmur  heard. Pulmonary/Chest: Effort normal. No respiratory distress. She has wheezes. She has no rales.  Abdominal: Soft. Bowel sounds are normal. She exhibits no distension. There is no tenderness. There is no rebound.  Musculoskeletal: Normal range of motion.  Neurological: She is alert and oriented to person, place, and time.  Skin: Skin is warm and dry. No rash noted.  Psychiatric: She has a normal mood and affect. Her behavior is normal.  Nursing note and vitals reviewed.   ED Course  Procedures (including critical care time) DIAGNOSTIC STUDIES: Oxygen Saturation is 100% on RA, nl by my interpretation.    COORDINATION OF CARE: 7:20 PM Discussed treatment plan with pt at bedside which includes CXR, breathing treatment, inhaler refill, abx Rx, steroid Rx, and pt agreed to plan.   Labs Review Labs Reviewed - No data to display  Imaging Review Dg Chest 2 View  03/14/2015  CLINICAL DATA:  Pain.  Patient reports wheezing and fever. EXAM: CHEST  2 VIEW COMPARISON:  07/23/2014 chest radiograph. FINDINGS: Stable cardiomediastinal silhouette with mild cardiomegaly. No pneumothorax. No pleural effusion. No pulmonary edema. No acute consolidative airspace disease. Partially visualized posterior lumbar spine hardware. IMPRESSION: Stable mild  cardiomegaly without pulmonary edema. No acute consolidative airspace disease to suggest a pneumonia. Electronically Signed   By: Ilona Sorrel M.D.   On: 03/14/2015 18:29   I have personally reviewed and evaluated these images as part of my medical decision-making.   EKG Interpretation None      MDM   Final diagnoses:  COPD exacerbation ([place])   Patient improved after nebulized albuterol. No infiltrate on chest x-ray. Did receive influenza vaccine. Plan is treatment for COPD exacerbation. Doubt ACS. Doubt pulmonary bolus. No pneumonia. Plan prednisone, albuterol, doxycycline, Tessalon.   I personally performed the services described in this documentation, which was scribed in my presence. The recorded information has been reviewed and is accurate.    Tanna Furry, MD 03/14/15 2034  Tanna Furry, MD 03/14/15 [phone]

## 2015-03-14 NOTE — ED Notes (Signed)
Pt sts she has been wheezing and "feeling bad" x5 days.  Pain under right axilla and in left back with deep breath. Denies cough. Reports hx of pneumonia multiple times and is concerned about that.

## 2015-03-14 NOTE — Discharge Instructions (Signed)

## 2015-08-17 ENCOUNTER — Encounter (HOSPITAL_COMMUNITY): Payer: Self-pay | Admitting: Emergency Medicine

## 2015-08-17 ENCOUNTER — Emergency Department (HOSPITAL_COMMUNITY): Payer: Medicare PPO

## 2015-08-17 ENCOUNTER — Emergency Department (HOSPITAL_COMMUNITY)
Admission: EM | Admit: 2015-08-17 | Discharge: 2015-08-17 | Disposition: A | Discharge status: Home / Self Care | Payer: Medicare PPO | Attending: Emergency Medicine | Admitting: Emergency Medicine

## 2015-08-17 DIAGNOSIS — J45909 Unspecified asthma, uncomplicated: Secondary | ICD-10-CM | POA: Insufficient documentation

## 2015-08-17 DIAGNOSIS — Z7984 Long term (current) use of oral hypoglycemic drugs: Secondary | ICD-10-CM | POA: Insufficient documentation

## 2015-08-17 DIAGNOSIS — E119 Type 2 diabetes mellitus without complications: Secondary | ICD-10-CM | POA: Insufficient documentation

## 2015-08-17 DIAGNOSIS — Z87891 Personal history of nicotine dependence: Secondary | ICD-10-CM | POA: Diagnosis not present

## 2015-08-17 DIAGNOSIS — Z79899 Other long term (current) drug therapy: Secondary | ICD-10-CM | POA: Insufficient documentation

## 2015-08-17 DIAGNOSIS — H538 Other visual disturbances: Secondary | ICD-10-CM | POA: Diagnosis not present

## 2015-08-17 DIAGNOSIS — E871 Hypo-osmolality and hyponatremia: Secondary | ICD-10-CM | POA: Insufficient documentation

## 2015-08-17 DIAGNOSIS — I10 Essential (primary) hypertension: Secondary | ICD-10-CM | POA: Insufficient documentation

## 2015-08-17 DIAGNOSIS — N289 Disorder of kidney and ureter, unspecified: Secondary | ICD-10-CM | POA: Insufficient documentation

## 2015-08-17 LAB — COMPREHENSIVE METABOLIC PANEL
ALT: 18 U/L (ref 14–54)
AST: 21 U/L (ref 15–41)
Albumin: 2.9 g/dL — ABNORMAL LOW (ref 3.5–5.0)
Alkaline Phosphatase: 63 U/L (ref 38–126)
Anion gap: 8 (ref 5–15)
BUN: 27 mg/dL — ABNORMAL HIGH (ref 6–20)
CO2: 25 mmol/L (ref 22–32)
Calcium: 10.7 mg/dL — ABNORMAL HIGH (ref 8.9–10.3)
Chloride: 94 mmol/L — ABNORMAL LOW (ref 101–111)
Creatinine, Ser: 2.68 mg/dL — ABNORMAL HIGH (ref 0.44–1.00)
GFR calc Af Amer: 22 mL/min — ABNORMAL LOW (ref >60)
GFR calc non Af Amer: 19 mL/min — ABNORMAL LOW (ref >60)
Glucose, Bld: 204 mg/dL — ABNORMAL HIGH (ref 65–99)
Potassium: 3.7 mmol/L (ref 3.5–5.1)
Sodium: 127 mmol/L — ABNORMAL LOW (ref 135–145)
Total Bilirubin: 0.4 mg/dL (ref 0.3–1.2)
Total Protein: 7 g/dL (ref 6.5–8.1)

## 2015-08-17 LAB — RAPID URINE DRUG SCREEN, HOSP PERFORMED
Amphetamines: NOT DETECTED
Barbiturates: NOT DETECTED
Benzodiazepines: NOT DETECTED
Cocaine: NOT DETECTED
Opiates: NOT DETECTED
Tetrahydrocannabinol: NOT DETECTED

## 2015-08-17 LAB — I-STAT CHEM 8, ED
BUN: 29 mg/dL — ABNORMAL HIGH (ref 6–20)
Calcium, Ion: 1.36 mmol/L — ABNORMAL HIGH (ref 1.13–1.30)
Chloride: 93 mmol/L — ABNORMAL LOW (ref 101–111)
Creatinine, Ser: 2.7 mg/dL — ABNORMAL HIGH (ref 0.44–1.00)
Glucose, Bld: 208 mg/dL — ABNORMAL HIGH (ref 65–99)
HCT: 34 % — ABNORMAL LOW (ref 36.0–46.0)
Hemoglobin: 11.6 g/dL — ABNORMAL LOW (ref 12.0–15.0)
Potassium: 3.8 mmol/L (ref 3.5–5.1)
Sodium: 129 mmol/L — ABNORMAL LOW (ref 135–145)
TCO2: 24 mmol/L (ref 0–100)

## 2015-08-17 LAB — URINE MICROSCOPIC-ADD ON

## 2015-08-17 LAB — URINALYSIS, ROUTINE W REFLEX MICROSCOPIC
Bilirubin Urine: NEGATIVE
Glucose, UA: NEGATIVE mg/dL
Hgb urine dipstick: NEGATIVE
Ketones, ur: NEGATIVE mg/dL
Leukocytes, UA: NEGATIVE
Nitrite: NEGATIVE
Protein, ur: 100 mg/dL — AB
Specific Gravity, Urine: 1.015 (ref 1.005–1.030)
pH: 5.5 (ref 5.0–8.0)

## 2015-08-17 LAB — CBC
HCT: 31.4 % — ABNORMAL LOW (ref 36.0–46.0)
Hemoglobin: 10.2 g/dL — ABNORMAL LOW (ref 12.0–15.0)
MCH: 27.7 pg (ref 26.0–34.0)
MCHC: 32.5 g/dL (ref 30.0–36.0)
MCV: 85.3 fL (ref 78.0–100.0)
Platelets: 378 10*3/uL (ref 150–400)
RBC: 3.68 MIL/uL — ABNORMAL LOW (ref 3.87–5.11)
RDW: 13.9 % (ref 11.5–15.5)
WBC: 6.5 10*3/uL (ref 4.0–10.5)

## 2015-08-17 LAB — I-STAT TROPONIN, ED: Troponin i, poc: 0 ng/mL (ref 0.00–0.08)

## 2015-08-17 LAB — DIFFERENTIAL
Basophils Absolute: 0 10*3/uL (ref 0.0–0.1)
Basophils Relative: 0 %
Eosinophils Absolute: 0.3 10*3/uL (ref 0.0–0.7)
Eosinophils Relative: 5 %
Lymphocytes Relative: 16 %
Lymphs Abs: 1.1 10*3/uL (ref 0.7–4.0)
Monocytes Absolute: 0.5 10*3/uL (ref 0.1–1.0)
Monocytes Relative: 7 %
Neutro Abs: 4.6 10*3/uL (ref 1.7–7.7)
Neutrophils Relative %: 72 %

## 2015-08-17 LAB — ETHANOL: Alcohol, Ethyl (B): <5 mg/dL (ref <5)

## 2015-08-17 MED ORDER — SODIUM CHLORIDE 0.9 % IV BOLUS (SEPSIS)
1000.0000 mL | Freq: Once | INTRAVENOUS | Status: AC
  Administered 2015-08-17: 1000 mL via INTRAVENOUS

## 2015-08-17 NOTE — ED Notes (Signed)
Pt states "It started yesterday, my vision has been getting blurrier, things im normally able to read i cant read". Pt also c/o pain in her head, sharp pain in the right part of her head. Pt is ambulatory, steady gait, in NAD. VSS. States she felt a little lightheaded yesterday but denies. Pain 1/10 in head.

## 2015-08-17 NOTE — ED Provider Notes (Signed)
CSN: [id number]     Arrival date & time 08/17/15  1227 History   First MD Initiated Contact with Patient 08/17/15 1601     Chief Complaint  Patient presents with  . Headache  . Blurred Vision     (Consider location/radiation/quality/duration/timing/severity/associated sxs/prior Treatment) HPI  Pt presenting with c/o blurry vision and intermittent headache.  She states symptoms started yesterday but she does not remember exactly when.  States she is not able to read things that she is normally able to read.  Headache is intermittent and throbbing.  No diplopia.  No weakness in arms or legs.  No vomiting.  No fever or neck pain.  She has not tried anything for her symptoms.  No vertigo.  No changes in gait.  Headache is intermittent, but blurry vision has been constant.  She cannot remember if the vision changes and the headache started at the same time.  There are no other associated systemic symptoms, there are no other alleviating or modifying factors.   Past Medical History  Diagnosis Date  . Essential hypertension   . Eczema   . Asthma   . Diabetes mellitus   . Chronic insomnia   . Arthritis   . Candida infection   . GERD (gastroesophageal reflux disease)   . Back pain, chronic   . Obesity    Past Surgical History  Procedure Laterality Date  . Cesarean section    . Tubal ligation     Family History  Problem Relation Age of Onset  . Lung cancer Mother   . Diabetes Sister   . Heart disease Sister   . Thyroid cancer      1/2 sister  . Heart disease      1/2 sister   Social History  Substance Use Topics  . Smoking status: Former Smoker -- 0.00 packs/day    Quit date: 02/03/2014  . Smokeless tobacco: None  . Alcohol Use: Yes     Comment: occ   OB History    No data available     Review of Systems  ROS reviewed and all otherwise negative except for mentioned in HPI    Allergies  Adalimumab; Ibuprofen; Leflunomide and related; Lexapro; Lisinopril; Statins; and  Trazodone and nefazodone  Home Medications   Prior to Admission medications   Medication Sig Start Date End Date Taking? Authorizing Provider  amLODipine (NORVASC) 10 MG tablet Take 10 mg by mouth daily.   Yes Historical Provider, MD  bisoprolol (ZEBETA) 10 MG tablet Take 20 mg by mouth daily.   Yes Historical Provider, MD  cetirizine (ZYRTEC) 10 MG tablet Take 1 tablet (10 mg total) by mouth 2 (two) times daily. Patient taking differently: Take 10 mg by mouth daily.  12/03/13  Yes Leandrew Koyanagi, MD  chlorthalidone (HYGROTON) 25 MG tablet Take 25 mg by mouth daily.   Yes Historical Provider, MD  Cholecalciferol (VITAMIN D PO) Take 1 tablet by mouth daily.   Yes Historical Provider, MD  cloNIDine (CATAPRES) 0.1 MG tablet Take 0.1 mg by mouth 2 (two) times daily. 07/15/15  Yes Historical Provider, MD  diazepam (VALIUM) 5 MG tablet Take 1 tablet (5 mg total) by mouth every 8 (eight) hours as needed (tremor). 09/05/14  Yes John Molpus, MD  diclofenac sodium (VOLTAREN) 1 % GEL Apply 2 g topically daily as needed. For joint pain   Yes Historical Provider, MD  EPINEPHrine (EPIPEN) 0.3 mg/0.3 mL SOAJ injection Inject 0.3 mLs (0.3 mg total) into the muscle  once. 09/30/12  Yes Thao P Le, DO  esomeprazole (NEXIUM) 20 MG capsule Take 20 mg by mouth daily before breakfast.   Yes Historical Provider, MD  ferrous sulfate 325 (65 FE) MG tablet Take 325 mg by mouth See admin instructions. Take on Tuesdays and Thursdays   Yes Historical Provider, MD  fluticasone (FLONASE) 50 MCG/ACT nasal spray Place 1 spray into both nostrils daily as needed for allergies or rhinitis.   Yes Historical Provider, MD  folic acid (FOLVITE) 1 MG tablet Take 0.4 mg by mouth daily.   Yes Historical Provider, MD  glyBURIDE (DIABETA) 2.5 MG tablet Take 2.5-7.5 mg by mouth 2 (two) times daily with a meal. Take 7.5 MG in the morning and 2.5 MG at night.   Yes Historical Provider, MD  JANUVIA 100 MG tablet Take 50 mg by mouth daily.  08/09/15  Yes Historical Provider, MD  losartan (COZAAR) 25 MG tablet Take 50 mg by mouth daily. 07/17/15  Yes Historical Provider, MD  montelukast (SINGULAIR) 10 MG tablet Take 10 mg by mouth at bedtime.   Yes Historical Provider, MD  Multiple Vitamin (MULTIVITAMIN) tablet Take 1 tablet by mouth daily.   Yes Historical Provider, MD  oxyCODONE (ROXICODONE) 15 MG immediate release tablet Take 15 mg by mouth 2 (two) times daily as needed. For pain.   Yes Historical Provider, MD  pravastatin (PRAVACHOL) 20 MG tablet Take 20 mg by mouth daily. 06/17/15  Yes Historical Provider, MD  Probiotic Product (PROBIOTIC DAILY PO) Take 1 tablet by mouth daily.   Yes Historical Provider, MD  promethazine (PHENERGAN) 25 MG tablet Take 25 mg by mouth every 6 (six) hours as needed for nausea or vomiting.   Yes Historical Provider, MD  ranitidine (ZANTAC) 300 MG tablet Take 300 mg by mouth 2 (two) times daily. 08/15/15  Yes Historical Provider, MD  VENTOLIN HFA 108 (90 Base) MCG/ACT inhaler Inhale 1 puff into the lungs every 6 (six) hours as needed. wheezing 06/13/15  Yes Historical Provider, MD  zolpidem (AMBIEN) 10 MG tablet Take 10 mg by mouth at bedtime as needed for sleep.   Yes Historical Provider, MD  benzonatate (TESSALON) 100 MG capsule Take 1 capsule (100 mg total) by mouth every 8 (eight) hours. Patient not taking: Reported on 08/17/2015 03/14/15   Tanna Furry, MD  doxycycline (VIBRAMYCIN) 100 MG capsule Take 1 capsule (100 mg total) by mouth 2 (two) times daily. Patient not taking: Reported on 08/17/2015 03/14/15   Tanna Furry, MD  predniSONE (DELTASONE) 20 MG tablet Take 1 tablet (20 mg total) by mouth 2 (two) times daily with a meal. 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 Mouth - mucous membranes moist, pharynx normal without lesions Chest - clear to auscultation, no wheezes, rales or rhonchi, symmetric air entry Heart - normal rate, regular rhythm, normal S1, S2, no murmurs, rubs, clicks or gallops Abdomen - soft, nontender, nondistended, no masses or organomegaly Neurological - alert, oriented x 3, cranial nerves 2-12 tested and intact, strength 5/5 in extremities x 4,  sensation intact Extremities - peripheral pulses normal, no pedal edema, no clubbing or cyanosis Skin - normal coloration and turgor, no rashes  ED Course  Procedures (including critical care time) Labs Review Labs Reviewed  CBC - Abnormal; Notable for the following:    RBC 3.68 (*)    Hemoglobin 10.2 (*)    HCT 31.4 (*)    All other components within normal limits  COMPREHENSIVE METABOLIC PANEL - Abnormal; Notable for the following:    Sodium 127 (*)    Chloride 94 (*)    Glucose, Bld 204 (*)    BUN 27 (*)    Creatinine, Ser 2.68 (*)    Calcium 10.7 (*)    Albumin 2.9 (*)    GFR calc non Af Amer 19 (*)    GFR calc Af Amer 22 (*)    All other components within normal limits  URINALYSIS, ROUTINE W REFLEX MICROSCOPIC (NOT AT [facility]) - Abnormal; Notable for the following:    Protein, ur 100 (*)    All other components within normal limits  URINE MICROSCOPIC-ADD ON - Abnormal; Notable for the following:    Squamous Epithelial / LPF 0-5 (*)    Bacteria, UA RARE (*)    Casts HYALINE CASTS (*)    All other components within normal limits  I-STAT CHEM 8, ED - Abnormal; Notable for the following:    Sodium 129 (*)    Chloride 93 (*)    BUN 29 (*)    Creatinine, Ser 2.70 (*)    Glucose, Bld 208 (*)    Calcium, Ion 1.36 (*)    Hemoglobin 11.6 (*)    HCT 34.0 (*)    All other components within normal  limits  ETHANOL  DIFFERENTIAL  URINE RAPID DRUG SCREEN, HOSP PERFORMED  PROTIME-INR  APTT  I-STAT TROPOININ, ED    Imaging Review Ct Head Wo Contrast  08/17/2015  CLINICAL DATA:  Headache with blurred vision EXAM: CT HEAD WITHOUT CONTRAST TECHNIQUE: Contiguous axial images were obtained from the base of the skull through the vertex without intravenous contrast. COMPARISON:  July 23, 2014 FINDINGS: Brain: The ventricles are normal in size and configuration. There is no intracranial mass, hemorrhage, extra-axial fluid collection, or midline shift. The gray-white compartments are normal except for minimal periventricular small vessel disease in the centra semiovale bilaterally. No acute infarct is evident. Vascular: There are foci of calcification in each cavernous carotid artery. Skull: Bony calvarium appears intact. Sinuses/Orbits: Paranasal sinuses are clear. There is a degree of symmetric ocular proptosis bilaterally, stable. The extraocular muscles do not appear enlarged. No intraorbital lesions are evident. Other: Mastoid air cells are clear. IMPRESSION: Stable minimal periventricular small vessel disease. No intracranial mass, hemorrhage, or evidence of acute infarct. There are foci of calcification in each carotid artery. Symmetric bilateral ocular proptosis without extraocular muscle thickness or enlargement. No intraorbital lesions are evident. This appearance is stable compared to prior study. Electronically Signed   By: Lowella Grip III M.D.   On: 08/17/2015 18:16   I have personally reviewed and evaluated these images and lab results as part of my medical decision-making.   EKG Interpretation   Date/Time:  Friday August 17 2015 16:49:55 EDT Ventricular Rate:  83 PR Interval:    QRS Duration: 95 QT Interval:  381 QTC Calculation: 448 R Axis:   -11 Text Interpretation:  Sinus rhythm Low voltage, precordial leads No  significant change since last tracing Confirmed by Canary Brim  MD,  Aitan Rossbach  (  [id number]) on 08/17/2015 5:02:05 PM      MDM   Final diagnoses:  Renal insufficiency  Hyponatremia  Blurred vision    Pt presenting with c/o worsening blurred vision.   Her neuro exam is normal, vision in right eye is 20/30, left eye 20/40.  No diplopia to suggest posterior CVA.  Head CT obtained and reassuring. Her labs reveal increase in renal insufficiency from one year ago, also some hyponatremia that has worsened mildly.  Pt treated with IV fluids in the ED.  Advised f/u with ophtalmology, have labs rechecked in 1 week.  Discharged with strict return precautions.  Pt agreeable with plan.    Alfonzo Beers, MD 08/17/15 2144

## 2015-08-17 NOTE — ED Notes (Signed)
Pt is in stable condition upon d/c and ambulates from ED. 

## 2015-08-17 NOTE — Discharge Instructions (Signed)
Return to the ED with any concerns including weakness of arms or legs, double vision, changes in speech, fainting, vomiting and not able to keep down liquids, decreased level of alertness/lethargy, or any other alarming symptoms

## 2015-08-17 NOTE — ED Notes (Signed)
Patient transported to CT 

## 2016-02-07 ENCOUNTER — Emergency Department (HOSPITAL_BASED_OUTPATIENT_CLINIC_OR_DEPARTMENT_OTHER)
Admission: EM | Admit: 2016-02-07 | Discharge: 2016-02-07 | Disposition: A | Discharge status: Home / Self Care | Payer: Medicare PPO | Attending: Emergency Medicine | Admitting: Emergency Medicine

## 2016-02-07 ENCOUNTER — Ambulatory Visit: Payer: Medicare PPO

## 2016-02-07 ENCOUNTER — Encounter (HOSPITAL_BASED_OUTPATIENT_CLINIC_OR_DEPARTMENT_OTHER): Payer: Self-pay | Admitting: *Deleted

## 2016-02-07 DIAGNOSIS — R062 Wheezing: Secondary | ICD-10-CM | POA: Diagnosis present

## 2016-02-07 DIAGNOSIS — Z87891 Personal history of nicotine dependence: Secondary | ICD-10-CM | POA: Insufficient documentation

## 2016-02-07 DIAGNOSIS — Z7984 Long term (current) use of oral hypoglycemic drugs: Secondary | ICD-10-CM | POA: Insufficient documentation

## 2016-02-07 DIAGNOSIS — I10 Essential (primary) hypertension: Secondary | ICD-10-CM | POA: Diagnosis not present

## 2016-02-07 DIAGNOSIS — E119 Type 2 diabetes mellitus without complications: Secondary | ICD-10-CM | POA: Insufficient documentation

## 2016-02-07 DIAGNOSIS — J45909 Unspecified asthma, uncomplicated: Secondary | ICD-10-CM | POA: Diagnosis not present

## 2016-02-07 DIAGNOSIS — L509 Urticaria, unspecified: Secondary | ICD-10-CM | POA: Diagnosis not present

## 2016-02-07 DIAGNOSIS — Z79899 Other long term (current) drug therapy: Secondary | ICD-10-CM | POA: Diagnosis not present

## 2016-02-07 MED ORDER — DEXAMETHASONE SODIUM PHOSPHATE 10 MG/ML IJ SOLN
10.0000 mg | Freq: Once | INTRAMUSCULAR | Status: AC
  Administered 2016-02-07: 10 mg via INTRAMUSCULAR
  Filled 2016-02-07: qty 1

## 2016-02-07 MED ORDER — DIPHENHYDRAMINE HCL 50 MG/ML IJ SOLN
50.0000 mg | Freq: Once | INTRAMUSCULAR | Status: AC
  Administered 2016-02-07: 50 mg via INTRAMUSCULAR
  Filled 2016-02-07: qty 1

## 2016-02-07 NOTE — ED Triage Notes (Signed)
Hives since last night. Hx of chronic hives. She took Benadryl last night but none today.

## 2016-02-07 NOTE — ED Provider Notes (Signed)
[facility] Provider Note   CSN: [id number] Arrival date & time: 02/07/16  1244  By signing my name below, I, Jodi Bell, attest that this documentation has been prepared under the direction and in the presence of Virgel Manifold, MD. Electronically Signed: Reola Bell, ED Scribe. 02/07/16. 1:46 PM.  History   Chief Complaint Chief Complaint  Patient presents with  . Urticaria   The history is provided by the patient. No language interpreter was used.    HPI Comments: Jodi Bell is a 55 y.o. female with a PMHx of DM2, asthma, and HTN, who presents to the Emergency Department complaining of gradually improving, persistent lip and bilateral eyelid swelling onset last night. She reports associated wheezing secondary to this issue as well. Pt notes that she has a h/o chronic hives, for which she takes Loratadine and Zyrtec. She notes that this controls this issue at home typically; however, with only mild of her current symptoms. Pt notes that prior to the onset of her facial swelling that she was eating chinese food, however, states that she has eaten this in the past without similar complications. No recent new foods or medication changes otherwise. She denies nausea, vomiting, shortness of breath, or any other associated symptoms.   Past Medical History:  Diagnosis Date  . Arthritis   . Asthma   . Back pain, chronic   . Candida infection   . Chronic insomnia   . Diabetes mellitus   . Eczema   . Essential hypertension   . GERD (gastroesophageal reflux disease)   . Obesity    Patient Active Problem List   Diagnosis Date Noted  . Serotonin syndrome 09/28/2013  . Respiratory failure ([place]) 09/28/2013  . Encephalopathy acute 09/28/2013  . Hypotension, unspecified 09/28/2013  . 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 DIABETES MELLITUS, TYPE II 03/06/2008  . INSOMNIA, CHRONIC 03/06/2008  . ESSENTIAL HYPERTENSION 03/06/2008  . ASTHMA 03/06/2008   . ECZEMA 03/06/2008  . BACK PAIN, LUMBAR, CHRONIC 03/06/2008   Past Surgical History:  Procedure Laterality Date  . CESAREAN SECTION    . TUBAL LIGATION     OB History    No data available     Home Medications    Prior to Admission medications   Medication Sig Start Date End Date Taking? Authorizing Provider  Etanercept (ENBREL [place]) Inject into the skin.   Yes Historical Provider, MD  amLODipine (NORVASC) 10 MG tablet Take 10 mg by mouth daily.    Historical Provider, MD  benzonatate (TESSALON) 100 MG capsule Take 1 capsule (100 mg total) by mouth every 8 (eight) hours. Patient not taking: Reported on 08/17/2015 03/14/15   Tanna Furry, MD  bisoprolol (ZEBETA) 10 MG tablet Take 20 mg by mouth daily.    Historical Provider, MD  cetirizine (ZYRTEC) 10 MG tablet Take 1 tablet (10 mg total) by mouth 2 (two) times daily. Patient taking differently: Take 10 mg by mouth daily.  12/03/13   Leandrew Koyanagi, MD  chlorthalidone (HYGROTON) 25 MG tablet Take 25 mg by mouth daily.    Historical Provider, MD  Cholecalciferol (VITAMIN D PO) Take 1 tablet by mouth daily.    Historical Provider, MD  cloNIDine (CATAPRES) 0.1 MG tablet Take 0.1 mg by mouth 2 (two) times daily. 07/15/15   Historical Provider, MD  diazepam (VALIUM) 5 MG tablet Take 1 tablet (5 mg total) by mouth every 8 (eight) hours as needed (tremor). 09/05/14   [name], MD  diclofenac  sodium (VOLTAREN) 1 % GEL Apply 2 g topically daily as needed. For joint pain    Historical Provider, MD  doxycycline (VIBRAMYCIN) 100 MG capsule Take 1 capsule (100 mg total) by mouth 2 (two) times daily. Patient not taking: Reported on 08/17/2015 03/14/15   Tanna Furry, MD  EPINEPHrine (EPIPEN) 0.3 mg/0.3 mL SOAJ injection Inject 0.3 mLs (0.3 mg total) into the muscle once. 09/30/12   Thao P Le, DO  esomeprazole (NEXIUM) 20 MG capsule Take 20 mg by mouth daily before breakfast.    Historical Provider, MD  ferrous sulfate 325 (65 FE) MG tablet Take 325 mg by  mouth See admin instructions. Take on Tuesdays and Thursdays    Historical Provider, MD  fluticasone (FLONASE) 50 MCG/ACT nasal spray Place 1 spray into both nostrils daily as needed for allergies or rhinitis.    Historical Provider, MD  folic acid (FOLVITE) 1 MG tablet Take 0.4 mg by mouth daily.    Historical Provider, MD  glyBURIDE (DIABETA) 2.5 MG tablet Take 2.5-7.5 mg by mouth 2 (two) times daily with a meal. Take 7.5 MG in the morning and 2.5 MG at night.    Historical Provider, MD  JANUVIA 100 MG tablet Take 50 mg by mouth daily. 08/09/15   Historical Provider, MD  losartan (COZAAR) 25 MG tablet Take 50 mg by mouth daily. 07/17/15   Historical Provider, MD  montelukast (SINGULAIR) 10 MG tablet Take 10 mg by mouth at bedtime.    Historical Provider, MD  Multiple Vitamin (MULTIVITAMIN) tablet Take 1 tablet by mouth daily.    Historical Provider, MD  oxyCODONE (ROXICODONE) 15 MG immediate release tablet Take 15 mg by mouth 2 (two) times daily as needed. For pain.    Historical Provider, MD  pravastatin (PRAVACHOL) 20 MG tablet Take 20 mg by mouth daily. 06/17/15   Historical Provider, MD  predniSONE (DELTASONE) 20 MG tablet Take 1 tablet (20 mg total) by mouth 2 (two) times daily with a meal. Patient not taking: Reported on 08/17/2015 03/14/15   Tanna Furry, MD  Probiotic Product (PROBIOTIC DAILY PO) Take 1 tablet by mouth daily.    Historical Provider, MD  promethazine (PHENERGAN) 25 MG tablet Take 25 mg by mouth every 6 (six) hours as needed for nausea or vomiting.    Historical Provider, MD  ranitidine (ZANTAC) 300 MG tablet Take 300 mg by mouth 2 (two) times daily. 08/15/15   Historical Provider, MD  VENTOLIN HFA 108 (90 Base) MCG/ACT inhaler Inhale 1 puff into the lungs every 6 (six) hours as needed. wheezing 06/13/15   Historical Provider, MD  zolpidem (AMBIEN) 10 MG tablet Take 10 mg by mouth at bedtime as needed for sleep.    Historical Provider, MD   Family History Family History  Problem  Relation Age of Onset  . Lung cancer Mother   . Diabetes Sister   . Heart disease Sister   . Thyroid cancer      1/2 sister  . Heart disease      1/2 sister   Social History Social History  Substance Use Topics  . Smoking status: Former Smoker    Packs/day: 0.00    Quit date: 02/03/2014  . Smokeless tobacco: Never Used  . Alcohol use Yes     Comment: occ   Allergies   Adalimumab; Ibuprofen; Leflunomide and related; Lexapro [escitalopram oxalate]; Lisinopril; Statins; and Trazodone and nefazodone  Review of Systems Review of Systems  HENT: Positive for facial swelling.   Respiratory:  Positive for wheezing. Negative for shortness of breath.   Gastrointestinal: Negative for nausea and vomiting.  All other systems reviewed and are negative.  Physical Exam Updated Vital Signs BP 157/91 (BP Location: Left Arm)   Pulse 81   Temp 97.7 F (36.5 C) (Oral)   Resp 20   Ht 5' 2.75" (1.594 m)   Wt 204 lb (92.5 kg)   SpO2 99%   BMI 36.43 kg/m   Physical Exam  Constitutional: She appears well-developed and well-nourished.  HENT:  Head: Normocephalic.  Right Ear: External ear normal.  Left Ear: External ear normal.  Nose: Nose normal.  Mouth/Throat: Oropharynx is clear and moist.  Mild facial puffiness. No oropharyngeal swelling.   Eyes: Conjunctivae are normal. Right eye exhibits no discharge. Left eye exhibits no discharge.  Neck: Normal range of motion.  Cardiovascular: Normal rate, regular rhythm and normal heart sounds.   No murmur heard. Pulmonary/Chest: Effort normal and breath sounds normal. No respiratory distress. She has no wheezes. She has no rales.  No increased work of breathing.   Abdominal: Soft. She exhibits no distension. There is no tenderness. There is no rebound and no guarding.  Musculoskeletal: Normal range of motion. She exhibits no edema or tenderness.  Neurological: She is alert. No cranial nerve deficit. Coordination normal.  Skin: Skin is warm  and dry. No rash noted. No erythema.  Psychiatric: She has a normal mood and affect. Her behavior is normal.  Nursing note and vitals reviewed.  ED Treatments / Results  DIAGNOSTIC STUDIES: Oxygen Saturation is 99% on RA, normal by my interpretation.   COORDINATION OF CARE: 1:46 PM-Discussed next steps with pt. Pt verbalized understanding and is agreeable with the plan.   Labs (all labs ordered are listed, but only abnormal results are displayed) Labs Reviewed - No data to display  EKG  EKG Interpretation None      Radiology No results found.  Procedures Procedures   Medications Ordered in ED Medications - No data to display  Initial Impression / Assessment and Plan / ED Course  I have reviewed the triage vital signs and the nursing notes.  Pertinent labs & imaging results that were available during my care of the patient were reviewed by me and considered in my medical decision making (see chart for details).  Clinical Course    55 year old female with urticaria.  No wheezing, stridor and hemodynamically stable. Plan course of steroids as needed antihistamines. She reports that she already has epinephrine pens. Reviewed indications for usage as well as return precautions.  Final Clinical Impressions(s) / ED Diagnoses   Final diagnoses:  Urticaria   New Prescriptions New Prescriptions   No medications on file   I personally preformed the services scribed in my presence. The recorded information has been reviewed is accurate. Virgel Manifold, MD.     Virgel Manifold, MD 02/14/16 [phone]

## 2017-06-10 NOTE — Progress Notes (Signed)
Need orders in epic for 5-16 surgery

## 2017-06-13 ENCOUNTER — Ambulatory Visit (HOSPITAL_COMMUNITY)
Admission: EM | Admit: 2017-06-13 | Discharge: 2017-06-13 | Disposition: A | Discharge status: Home / Self Care | Payer: Medicare PPO | Attending: Family Medicine | Admitting: Family Medicine

## 2017-06-13 ENCOUNTER — Encounter (HOSPITAL_COMMUNITY): Payer: Self-pay | Admitting: Emergency Medicine

## 2017-06-13 DIAGNOSIS — R062 Wheezing: Secondary | ICD-10-CM

## 2017-06-13 DIAGNOSIS — R0602 Shortness of breath: Secondary | ICD-10-CM | POA: Diagnosis not present

## 2017-06-13 DIAGNOSIS — Z711 Person with feared health complaint in whom no diagnosis is made: Secondary | ICD-10-CM

## 2017-06-13 NOTE — ED Provider Notes (Signed)
  [facility]    CSN: [id number] Arrival date & time: 06/13/17  1610  Chief Complaint  Patient presents with  . Asthma    Subjective: Patient is a 57 y.o. female here for several episodes of waking up gasping for air.  This happened only today.  She would not off on the couch and wake up feeling like she could not catch her breath.  She does have a history of asthma.  No recent exposure to triggers.  She did use her albuterol inhaler around 1 hour before presentation.  She does not feel like she is currently wheezing or having any shortness of breath/cough.  The last time she felt this, it was many years ago and her thyroid levels were off.  Her PCP recently checked her thyroid levels and she believes that she was told they are abnormal, but does not know the plan moving forward.  She does not have a known history of sleep apnea nor does this bother her at night.   ROS: Heart: Denies chest pain  Lungs: Denies current SOB   Family History  Problem Relation Age of Onset  . Lung cancer Mother   . Diabetes Sister   . Heart disease Sister   . Thyroid cancer Unknown        1/2 sister  . Heart disease Unknown        1/2 sister   Past Medical History:  Diagnosis Date  . Arthritis   . Asthma   . Back pain, chronic   . Candida infection   . Chronic insomnia   . Diabetes mellitus   . Eczema   . Essential hypertension   . GERD (gastroesophageal reflux disease)   . Obesity       Objective: BP 119/60 (BP Location: Right Arm)   Pulse 81   Temp 98.4 F (36.9 C) (Oral)   Resp (!) 22   SpO2 98%  General: Awake, appears stated age HEENT: MMM, EOMi Heart: RRR, no bruits or lower extremity edema Lungs: CTAB, no rales, wheezes or rhonchi. No accessory muscle use Psych: Age appropriate judgment and insight, normal affect and mood  Assessment and Plan: Worried well  The patient's main goal of coming in today was to make sure nothing was abnormal with her heart or lungs.   I did not hear anything regarding these issues.  Her throat does show some evidence of Candida, however she is being treated for this.  We will have her follow-up with her PCP regarding her abnormal thyroid levels and move forward from there.  She has never had a formal sleep study but does not complain of this issue at night. The patient voiced understanding and agreement to the plan.       Shelda Pal, [place] 06/13/17 [phone]

## 2017-06-13 NOTE — Discharge Instructions (Signed)
Your heart and lungs sound great today.   Follow up with your PCP regarding your thyroid labs.

## 2017-06-13 NOTE — ED Triage Notes (Signed)
Pt was sitting watching TV today and she started getting a sensation of holding her breath and feeling like she cant catch her breath. Hx asthma. C/o wheezing.

## 2017-06-18 ENCOUNTER — Other Ambulatory Visit (HOSPITAL_COMMUNITY): Payer: Self-pay | Admitting: *Deleted

## 2017-06-18 NOTE — Patient Instructions (Addendum)
ROSILAND SEN  06/18/2017   Your procedure is scheduled on: 06-25-17  Report to [facility] Main  Entrance  Report to admitting at 715 AM    Call this number if you have problems the morning of surgery [phone]   Remember: Do not eat food or drink liquids :After Midnight.  How to Manage Your Diabetes Before and After Surgery  Why is it important to control my blood sugar before and after surgery? . Improving blood sugar levels before and after surgery helps healing and can limit problems. . A way of improving blood sugar control is eating a healthy diet by: o  Eating less sugar and carbohydrates o  Increasing activity/exercise o  Talking with your doctor about reaching your blood sugar goals . High blood sugars (greater than 180 mg/dL) can raise your risk of infections and slow your recovery, so you will need to focus on controlling your diabetes during the weeks before surgery. . Make sure that the doctor who takes care of your diabetes knows about your planned surgery including the date and location.  How do I manage my blood sugar before surgery? . Check your blood sugar at least 4 times a day, starting 2 days before surgery, to make sure that the level is not too high or low. o Check your blood sugar the morning of your surgery when you wake up and every 2 hours until you get to the Short Stay unit. . If your blood sugar is less than 70 mg/dL, you will need to treat for low blood sugar: o Do not take insulin. o Treat a low blood sugar (less than 70 mg/dL) with  cup of clear juice (cranberry or apple), 4 glucose tablets, OR glucose gel. o Recheck blood sugar in 15 minutes after treatment (to make sure it is greater than 70 mg/dL). If your blood sugar is not greater than 70 mg/dL on recheck, call [phone] for further instructions. . Report your blood sugar to the short stay nurse when you get to Short Stay.  . If you are admitted to the hospital  after surgery: o Your blood sugar will be checked by the staff and you will probably be given insulin after surgery (instead of oral diabetes medicines) to make sure you have good blood sugar levels. o The goal for blood sugar control after surgery is 80-180 mg/dL.   WHAT DO I DO ABOUT MY DIABETES MEDICATION?  . THE DAY BEFORE SURGERY 06-24-17 TAKE YOUR MORNING DOSE OF JANUVIA.  Marland Kitchen THE NIGHT BEFORE SURGERY 06-24-17 TAKE 1/2 OF YOUR EVENING DOSE OF TRESHIBA ( TAKE 6 UNITS )      . THE MORNING OF SURGERY 06-25-17 TAKE NO DIABETIC MEDICATIONS.   Take these medicines the morning of surgery with A SIP OF WATER: OXYCODONE IF NEEDED, VENTOLIN INHALER IF NEEDED AND BRING INHALER WITH YOU, CERTRIZINE (ZYRTEC), CLONIDINE (CATAPRES),, AMLOPININE (NORVASC), PREDNISOLONE (DELTASONE), FLONASE NASAL SPRAY, RANITIDINE, BISOPROLOL, HYDRAZALINE DO NOT TAKE ANY DIABETIC MEDICATIONS DAY OF YOUR SURGERY                               You may not have any metal on your body including hair pins and              piercings  Do not wear jewelry, make-up, lotions, powders or perfumes, deodorant  Do not wear nail polish.  Do not shave  48 hours prior to surgery.                 Do not bring valuables to the hospital. [place].  Contacts, dentures or bridgework may not be worn into surgery.  Leave suitcase in the car. After surgery it may be brought to your room.                 Please read over the following fact sheets you were given: _____________________________________________________________________  [facility] - Preparing for Surgery Before surgery, you can play an important role.  Because skin is not sterile, your skin needs to be as free of germs as possible.  You can reduce the number of germs on your skin by washing with CHG (chlorahexidine gluconate) soap before surgery.  CHG is an antiseptic cleaner which kills germs and bonds with the skin to  continue killing germs even after washing. Please DO NOT use if you have an allergy to CHG or antibacterial soaps.  If your skin becomes reddened/irritated stop using the CHG and inform your nurse when you arrive at Short Stay. Do not shave (including legs and underarms) for at least 48 hours prior to the first CHG shower.  You may shave your face/neck. Please follow these instructions carefully:  1.  Shower with CHG Soap the night before surgery and the  morning of Surgery.  2.  If you choose to wash your hair, wash your hair first as usual with your  normal  shampoo.  3.  After you shampoo, rinse your hair and body thoroughly to remove the  shampoo.                           4.  Use CHG as you would any other liquid soap.  You can apply chg directly  to the skin and wash                       Gently with a scrungie or clean washcloth.  5.  Apply the CHG Soap to your body ONLY FROM THE NECK DOWN.   Do not use on face/ open                           Wound or open sores. Avoid contact with eyes, ears mouth and genitals (private parts).                       Wash face,  Genitals (private parts) with your normal soap.             6.  Wash thoroughly, paying special attention to the area where your surgery  will be performed.  7.  Thoroughly rinse your body with warm water from the neck down.  8.  DO NOT shower/wash with your normal soap after using and rinsing off  the CHG Soap.                9.  Pat yourself dry with a clean towel.            10.  Wear clean pajamas.            11.  Place clean sheets on your bed the  night of your first shower and do not  sleep with pets. Day of Surgery : Do not apply any lotions/deodorants the morning of surgery.  Please wear clean clothes to the hospital/surgery center.  FAILURE TO FOLLOW THESE INSTRUCTIONS MAY RESULT IN THE CANCELLATION OF YOUR SURGERY PATIENT SIGNATURE_________________________________  NURSE  SIGNATURE__________________________________  ________________________________________________________________________   [name]  An incentive spirometer is a tool that can help keep your lungs clear and active. This tool measures how well you are filling your lungs with each breath. Taking long deep breaths may help reverse or decrease the chance of developing breathing (pulmonary) problems (especially infection) following:  A long period of time when you are unable to move or be active. BEFORE THE PROCEDURE   If the spirometer includes an indicator to show your best effort, your nurse or respiratory therapist will set it to a desired goal.  If possible, sit up straight or lean slightly forward. Try not to slouch.  Hold the incentive spirometer in an upright position. INSTRUCTIONS FOR USE  1. Sit on the edge of your bed if possible, or sit up as far as you can in bed or on a chair. 2. Hold the incentive spirometer in an upright position. 3. Breathe out normally. 4. Place the mouthpiece in your mouth and seal your lips tightly around it. 5. Breathe in slowly and as deeply as possible, raising the piston or the ball toward the top of the column. 6. Hold your breath for 3-5 seconds or for as long as possible. Allow the piston or ball to fall to the bottom of the column. 7. Remove the mouthpiece from your mouth and breathe out normally. 8. Rest for a few seconds and repeat Steps 1 through 7 at least 10 times every 1-2 hours when you are awake. Take your time and take a few normal breaths between deep breaths. 9. The spirometer may include an indicator to show your best effort. Use the indicator as a goal to work toward during each repetition. 10. After each set of 10 deep breaths, practice coughing to be sure your lungs are clear. If you have an incision (the cut made at the time of surgery), support your incision when coughing by placing a pillow or rolled up towels firmly  against it. Once you are able to get out of bed, walk around indoors and cough well. You may stop using the incentive spirometer when instructed by your caregiver.  RISKS AND COMPLICATIONS  Take your time so you do not get dizzy or light-headed.  If you are in pain, you may need to take or ask for pain medication before doing incentive spirometry. It is harder to take a deep breath if you are having pain. AFTER USE  Rest and breathe slowly and easily.  It can be helpful to keep track of a log of your progress. Your caregiver can provide you with a simple table to help with this. If you are using the spirometer at home, follow these instructions: [place] IF:   You are having difficultly using the spirometer.  You have trouble using the spirometer as often as instructed.  Your pain medication is not giving enough relief while using the spirometer.  You develop fever of 100.5 F (38.1 C) or higher. SEEK IMMEDIATE MEDICAL CARE IF:   You cough up bloody sputum that had not been present before.  You develop fever of 102 F (38.9 C) or greater.  You develop worsening pain at or  near the incision site. MAKE SURE YOU:   Understand these instructions.  Will watch your condition.  Will get help right away if you are not doing well or get worse. Document Released: 06/09/2006 Document Revised: 04/21/2011 Document Reviewed: 08/10/2006 ExitCare Patient Information 2014 Exit[employer], [place].   ________________________________________________________________________  WHAT IS A BLOOD TRANSFUSION? Blood Transfusion Information  A transfusion is the replacement of blood or some of its parts. Blood is made up of multiple cells which provide different functions.  Red blood cells carry oxygen and are used for blood loss replacement.  White blood cells fight against infection.  Platelets control bleeding.  Plasma helps clot blood.  Other blood products are available for  specialized needs, such as hemophilia or other clotting disorders. BEFORE THE TRANSFUSION  Who gives blood for transfusions?   Healthy volunteers who are fully evaluated to make sure their blood is safe. This is blood bank blood. Transfusion therapy is the safest it has ever been in the practice of medicine. Before blood is taken from a donor, a complete history is taken to make sure that person has no history of diseases nor engages in risky social behavior (examples are intravenous drug use or sexual activity with multiple partners). The donor's travel history is screened to minimize risk of transmitting infections, such as malaria. The donated blood is tested for signs of infectious diseases, such as HIV and hepatitis. The blood is then tested to be sure it is compatible with you in order to minimize the chance of a transfusion reaction. If you or a relative donates blood, this is often done in anticipation of surgery and is not appropriate for emergency situations. It takes many days to process the donated blood. RISKS AND COMPLICATIONS Although transfusion therapy is very safe and saves many lives, the main dangers of transfusion include:   Getting an infectious disease.  Developing a transfusion reaction. This is an allergic reaction to something in the blood you were given. Every precaution is taken to prevent this. The decision to have a blood transfusion has been considered carefully by your caregiver before blood is given. Blood is not given unless the benefits outweigh the risks. AFTER THE TRANSFUSION  Right after receiving a blood transfusion, you will usually feel much better and more energetic. This is especially true if your red blood cells have gotten low (anemic). The transfusion raises the level of the red blood cells which carry oxygen, and this usually causes an energy increase.  The nurse administering the transfusion will monitor you carefully for complications. HOME CARE  INSTRUCTIONS  No special instructions are needed after a transfusion. You may find your energy is better. Speak with your caregiver about any limitations on activity for underlying diseases you may have. SEEK MEDICAL CARE IF:   Your condition is not improving after your transfusion.  You develop redness or irritation at the intravenous (IV) site. SEEK IMMEDIATE MEDICAL CARE IF:  Any of the following symptoms occur over the next 12 hours:  Shaking chills.  You have a temperature by mouth above 102 F (38.9 C), not controlled by medicine.  Chest, back, or muscle pain.  People around you feel you are not acting correctly or are confused.  Shortness of breath or difficulty breathing.  Dizziness and fainting.  You get a rash or develop hives.  You have a decrease in urine output.  Your urine turns a dark color or changes to pink, red, or brown. Any of the following symptoms occur over  the next 10 days:  You have a temperature by mouth above 102 F (38.9 C), not controlled by medicine.  Shortness of breath.  Weakness after normal activity.  The white part of the eye turns yellow (jaundice).  You have a decrease in the amount of urine or are urinating less often.  Your urine turns a dark color or changes to pink, red, or brown. Document Released: 01/25/2000 Document Revised: 04/21/2011 Document Reviewed: 09/13/2007 [facility] Patient Information 2014 [place], [place].  _______________________________________________________________________

## 2017-06-19 ENCOUNTER — Encounter (HOSPITAL_COMMUNITY): Payer: Self-pay

## 2017-06-19 ENCOUNTER — Other Ambulatory Visit: Payer: Self-pay

## 2017-06-19 ENCOUNTER — Encounter (HOSPITAL_COMMUNITY)
Admission: RE | Admit: 2017-06-19 | Discharge: 2017-06-19 | Disposition: A | Discharge status: Home / Self Care | Payer: Medicare PPO | Source: Ambulatory Visit | Attending: Orthopedic Surgery | Admitting: Orthopedic Surgery

## 2017-06-19 DIAGNOSIS — I129 Hypertensive chronic kidney disease with stage 1 through stage 4 chronic kidney disease, or unspecified chronic kidney disease: Secondary | ICD-10-CM | POA: Diagnosis not present

## 2017-06-19 DIAGNOSIS — G4700 Insomnia, unspecified: Secondary | ICD-10-CM | POA: Diagnosis not present

## 2017-06-19 DIAGNOSIS — R9431 Abnormal electrocardiogram [ECG] [EKG]: Secondary | ICD-10-CM | POA: Insufficient documentation

## 2017-06-19 DIAGNOSIS — K219 Gastro-esophageal reflux disease without esophagitis: Secondary | ICD-10-CM | POA: Insufficient documentation

## 2017-06-19 DIAGNOSIS — N189 Chronic kidney disease, unspecified: Secondary | ICD-10-CM | POA: Insufficient documentation

## 2017-06-19 DIAGNOSIS — I10 Essential (primary) hypertension: Secondary | ICD-10-CM | POA: Diagnosis not present

## 2017-06-19 DIAGNOSIS — M1711 Unilateral primary osteoarthritis, right knee: Secondary | ICD-10-CM | POA: Insufficient documentation

## 2017-06-19 DIAGNOSIS — Z794 Long term (current) use of insulin: Secondary | ICD-10-CM | POA: Diagnosis not present

## 2017-06-19 DIAGNOSIS — E1122 Type 2 diabetes mellitus with diabetic chronic kidney disease: Secondary | ICD-10-CM | POA: Diagnosis not present

## 2017-06-19 DIAGNOSIS — Z01818 Encounter for other preprocedural examination: Secondary | ICD-10-CM | POA: Diagnosis not present

## 2017-06-19 DIAGNOSIS — Z7952 Long term (current) use of systemic steroids: Secondary | ICD-10-CM | POA: Diagnosis not present

## 2017-06-19 DIAGNOSIS — Z79899 Other long term (current) drug therapy: Secondary | ICD-10-CM | POA: Insufficient documentation

## 2017-06-19 DIAGNOSIS — Z87891 Personal history of nicotine dependence: Secondary | ICD-10-CM | POA: Insufficient documentation

## 2017-06-19 HISTORY — DX: Chronic kidney disease, unspecified: N18.9

## 2017-06-19 HISTORY — DX: Pneumonia, unspecified organism: J18.9

## 2017-06-19 HISTORY — DX: Anemia, unspecified: D64.9

## 2017-06-19 HISTORY — DX: Personal history of other diseases of the digestive system: Z87.19

## 2017-06-19 LAB — HEMOGLOBIN A1C
Hgb A1c MFr Bld: 6.9 % — ABNORMAL HIGH (ref 4.8–5.6)
Mean Plasma Glucose: 151.33 mg/dL

## 2017-06-19 LAB — CBC
HCT: 28 % — ABNORMAL LOW (ref 36.0–46.0)
Hemoglobin: 8.5 g/dL — ABNORMAL LOW (ref 12.0–15.0)
MCH: 28.2 pg (ref 26.0–34.0)
MCHC: 30.4 g/dL (ref 30.0–36.0)
MCV: 93 fL (ref 78.0–100.0)
Platelets: 394 10*3/uL (ref 150–400)
RBC: 3.01 MIL/uL — ABNORMAL LOW (ref 3.87–5.11)
RDW: 16 % — ABNORMAL HIGH (ref 11.5–15.5)
WBC: 6.4 10*3/uL (ref 4.0–10.5)

## 2017-06-19 LAB — ABO/RH: ABO/RH(D): AB POS

## 2017-06-19 LAB — BASIC METABOLIC PANEL
Anion gap: 10 (ref 5–15)
BUN: 57 mg/dL — ABNORMAL HIGH (ref 6–20)
CO2: 19 mmol/L — ABNORMAL LOW (ref 22–32)
Calcium: 10.2 mg/dL (ref 8.9–10.3)
Chloride: 110 mmol/L (ref 101–111)
Creatinine, Ser: 5.1 mg/dL — ABNORMAL HIGH (ref 0.44–1.00)
GFR calc Af Amer: 10 mL/min — ABNORMAL LOW (ref >60)
GFR calc non Af Amer: 9 mL/min — ABNORMAL LOW (ref >60)
Glucose, Bld: 128 mg/dL — ABNORMAL HIGH (ref 65–99)
Potassium: 5.6 mmol/L — ABNORMAL HIGH (ref 3.5–5.1)
Sodium: 139 mmol/L (ref 135–145)

## 2017-06-19 LAB — GLUCOSE, CAPILLARY: Glucose-Capillary: 108 mg/dL — ABNORMAL HIGH (ref 65–99)

## 2017-06-19 LAB — SURGICAL PCR SCREEN
MRSA, PCR: NEGATIVE
Staphylococcus aureus: NEGATIVE

## 2017-06-19 NOTE — Progress Notes (Signed)
CBC AND BMET RESULTS ROUTED BY Epic TO DR Alvan Dame AND LEFT MESSAGE WITH SHERRY [place]

## 2017-06-22 NOTE — H&P (Signed)
TOTAL KNEE ADMISSION H&P  Patient is being admitted for right total knee arthroplasty.  Subjective:  Chief Complaint:   Right knee primary OA / pain  HPI: Jodi Bell, 57 y.o. female, has a history of pain and functional disability in the right knee due to arthritis and has failed non-surgical conservative treatments for greater than 12 weeks to include NSAID's and/or analgesics, corticosteriod injections, viscosupplementation injections and activity modification.  Onset of symptoms was gradual, starting 9+ years ago with gradually worsening course since that time. The patient noted no past surgery on the right knee(s).  Patient currently rates pain in the right knee(s) at 9 out of 10 with activity. Patient has night pain, worsening of pain with activity and weight bearing, pain that interferes with activities of daily living, pain with passive range of motion, crepitus and joint swelling.  Patient has evidence of periarticular osteophytes and joint space narrowing by imaging studies.There is no active infection.  Risks, benefits and expectations were discussed with the patient.  Risks including but not limited to the risk of anesthesia, blood clots, nerve damage, blood vessel damage, failure of the prosthesis, infection and up to and including death.  Patient understand the risks, benefits and expectations and wishes to proceed with surgery.   PCP: [place]  D/C Plans:       Home  Post-op Meds:       No Rx given   Tranexamic Acid:      To be given - IV   Decadron:      Is to be given  FYI:     Eliquis (kidney function)  Oxycodone ( on pre-op)  DME:   Pt already has equipment  PT:   OPPT Rx given   Patient Active Problem List   Diagnosis Date Noted  . Serotonin syndrome 09/28/2013  . Respiratory failure ([place]) 09/28/2013  . Encephalopathy acute 09/28/2013  . Hypotension, unspecified 09/28/2013  . 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 DIABETES MELLITUS, TYPE  II 03/06/2008  . INSOMNIA, CHRONIC 03/06/2008  . ESSENTIAL HYPERTENSION 03/06/2008  . ASTHMA 03/06/2008  . ECZEMA 03/06/2008  . BACK PAIN, LUMBAR, CHRONIC 03/06/2008   Past Medical History:  Diagnosis Date  . Anemia   . Arthritis    oa and psoriatic ra  . Asthma   . Back pain, chronic    lower back  . Candida infection finished tx 2 days ago  . Chronic insomnia   . Chronic kidney disease    stage 3 ckd last ov nephrology 04-29-17 epic  . Diabetes mellitus    type 2  . Eczema   . Essential hypertension   . GERD (gastroesophageal reflux disease)   . History of hiatal hernia   . Obesity   . Pneumonia few yrs ago x 2    Past Surgical History:  Procedure Laterality Date  . BACK SURGERY  2016   lower back fusion with cage  . CESAREAN SECTION  1990   x 1   . TUBAL LIGATION  1990    No current facility-administered medications for this encounter.    Current Outpatient Medications  Medication Sig Dispense Refill Last Dose  . allopurinol (ZYLOPRIM) 300 MG tablet Take 150 mg by mouth every evening.      Marland Kitchen amLODipine (NORVASC) 10 MG tablet Take 10 mg by mouth daily.   08/17/2015 at Unknown time  . bisoprolol (ZEBETA) 10 MG tablet Take 20 mg by mouth daily. Takes at 100 pm  08/17/2015 at Unknown time  . cetirizine (ZYRTEC) 10 MG tablet Take 20 mg by mouth 2 (two) times daily.      . cholecalciferol (VITAMIN D) 1000 units tablet Take 1 tablet by mouth daily.   08/17/2015 at Unknown time  . cloNIDine (CATAPRES) 0.2 MG tablet Take 0.2 mg by mouth 2 (two) times daily.    08/17/2015 at Unknown time  . diclofenac sodium (VOLTAREN) 1 % GEL Apply 2 g topically daily as needed. For joint pain   Past Week at Unknown time  . EPINEPHrine (EPIPEN) 0.3 mg/0.3 mL SOAJ injection Inject 0.3 mLs (0.3 mg total) into the muscle once. 2 Device 1 unk at unk  . esomeprazole (NEXIUM) 20 MG capsule Take 20 mg by mouth every evening.    08/17/2015 at Unknown time  . ferrous sulfate 325 (65 FE) MG tablet Take 325 mg  by mouth daily.    08/16/2015 at Unknown time  . fluticasone (FLONASE) 50 MCG/ACT nasal spray Place 1 spray into both nostrils daily.    Past Week at Unknown time  . folic acid (FOLVITE) 846 MCG tablet Take 0.4 mg by mouth daily.   08/17/2015 at Unknown time  . furosemide (LASIX) 40 MG tablet Take 40 mg by mouth daily.     Marland Kitchen gabapentin (NEURONTIN) 100 MG capsule Take 200 mg by mouth at bedtime.      . hydrALAZINE (APRESOLINE) 25 MG tablet Take 1 tablet by mouth 4 (four) times daily.     . Insulin Degludec (TRESIBA FLEXTOUCH) 200 UNIT/ML SOPN Inject 14 Units into the skin every evening.      Marland Kitchen JANUVIA 100 MG tablet Take 100 mg by mouth daily.    08/17/2015 at Unknown time  . montelukast (SINGULAIR) 10 MG tablet Take 10 mg by mouth at bedtime.   08/16/2015 at Unknown time  . Multiple Vitamin (MULTIVITAMIN) tablet Take 1 tablet by mouth daily.   08/17/2015 at Unknown time  . oxyCODONE (ROXICODONE) 15 MG immediate release tablet Take 7.5-15 mg by mouth 3 (three) times daily as needed for pain. For pain.   Past Week at Unknown time  . potassium chloride SA (K-DUR,KLOR-CON) 20 MEQ tablet Take 10 mEq by mouth daily.     . predniSONE (DELTASONE) 2.5 MG tablet Take 2.5 mg by mouth daily with breakfast.     . Probiotic Product (PROBIOTIC DAILY PO) Take 1 tablet by mouth daily.   08/17/2015 at Unknown time  . promethazine (PHENERGAN) 25 MG tablet Take 25 mg by mouth every 6 (six) hours as needed for nausea or vomiting.   Past Week at Unknown time  . ranitidine (ZANTAC) 300 MG tablet Take 300 mg by mouth 2 (two) times daily.   08/17/2015 at Unknown time  . senna-docusate (PERI-COLACE) 8.6-50 MG tablet Take 1 tablet by mouth daily as needed for mild constipation.     . VENTOLIN HFA 108 (90 Base) MCG/ACT inhaler Inhale 1 puff into the lungs every 6 (six) hours as needed for shortness of breath. wheezing   rescue at rescue  . zolpidem (AMBIEN) 10 MG tablet Take 10 mg by mouth at bedtime as needed for sleep.   Past Week at  Unknown time  . etanercept (ENBREL) 50 MG/ML injection Inject 50 mg into the skin once a week.       Allergies  Allergen Reactions  . Adalimumab Other (See Comments)    Blisters on abdomen =humira  . Ibuprofen Other (See Comments)    Renal Problems  .  Leflunomide And Related Other (See Comments)    Severe Headache  . Lexapro [Escitalopram Oxalate] Other (See Comments)    Hand problems - Serotonin Syndrome   . Lisinopril Swelling    Angioedema   . Other     Cosentyx= angioedema   . Pistachio Nut (Diagnostic)     Mouth itches and tongue swelling Hazel nuts and pecans also  . Statins Other (See Comments)    Leg pains and weakness  . Trazodone And Nefazodone Other (See Comments)    Serotonin Syndrome     Social History   Tobacco Use  . Smoking status: Former Smoker    Packs/day: 0.50    Years: 40.00    Pack years: 20.00    Types: Cigarettes    Last attempt to quit: 02/03/2014    Years since quitting: 3.3  . Smokeless tobacco: Never Used  Substance Use Topics  . Alcohol use: Yes    Comment: occ    Family History  Problem Relation Age of Onset  . Lung cancer Mother   . Diabetes Sister   . Heart disease Sister   . Thyroid cancer Unknown        1/2 sister  . Heart disease Unknown        1/2 sister     Review of Systems  Constitutional: Negative.   HENT: Negative.   Eyes: Negative.   Respiratory: Negative.   Cardiovascular: Negative.   Gastrointestinal: Positive for heartburn.  Genitourinary: Negative.   Musculoskeletal: Positive for back pain and joint pain.  Skin: Negative.   Neurological: Negative.   Endo/Heme/Allergies: Negative.   Psychiatric/Behavioral: The patient has insomnia.     Objective:  Physical Exam  Constitutional: She is oriented to person, place, and time. She appears well-developed.  HENT:  Head: Normocephalic.  Eyes: Pupils are equal, round, and reactive to light.  Neck: Neck supple. No JVD present. No tracheal deviation present. No  thyromegaly present.  Cardiovascular: Normal rate, regular rhythm and intact distal pulses.  Respiratory: Effort normal and breath sounds normal. No respiratory distress. She has no wheezes.  GI: Soft. There is no tenderness. There is no guarding.  Musculoskeletal:       Right knee: She exhibits decreased range of motion, swelling and bony tenderness. She exhibits no ecchymosis, no deformity, no laceration and no erythema. Tenderness found.  Lymphadenopathy:    She has no cervical adenopathy.  Neurological: She is alert and oriented to person, place, and time. A sensory deficit (bilateral LE DM neuropathy) is present.  Skin: Skin is warm and dry.  Psychiatric: She has a normal mood and affect.      Labs:  Estimated body mass index is 31.89 kg/m as calculated from the following:   Height as of 06/19/17: 5' 2.75" (1.594 m).   Weight as of 06/19/17: 81 kg (178 lb 9.6 oz).   Imaging Review Plain radiographs demonstrate severe degenerative joint disease of the right knee.  The bone quality appears to be good for age and reported activity level.   Preoperative templating of the joint replacement has been completed, documented, and submitted to the Operating Room personnel in order to optimize intra-operative equipment management.   Anticipated LOS equal to or greater than 2 midnights due to - Age 27 and older with one or more of the following:  - Obesity  - Expected need for hospital services (PT, OT, Nursing) required for safe  discharge  - Anticipated need for postoperative skilled nursing care or inpatient  rehab  - Active co-morbidities: Chronic pain requiring opiods, Diabetes and CKD    Assessment/Plan:  End stage arthritis, right knee   The patient history, physical examination, clinical judgment of the provider and imaging studies are consistent with end stage degenerative joint disease of the right knee(s) and total knee arthroplasty is deemed medically necessary. The  treatment options including medical management, injection therapy arthroscopy and arthroplasty were discussed at length. The risks and benefits of total knee arthroplasty were presented and reviewed. The risks due to aseptic loosening, infection, stiffness, patella tracking problems, thromboembolic complications and other imponderables were discussed. The patient acknowledged the explanation, agreed to proceed with the plan and consent was signed. Patient is being admitted for inpatient treatment for surgery, pain control, PT, OT, prophylactic antibiotics, VTE prophylaxis, progressive ambulation and ADL's and discharge planning. The patient is planning to be discharged home.    [place] P[place] Tichina Koebel   PA-C  06/22/2017, 8:51 AM

## 2017-06-24 ENCOUNTER — Encounter (HOSPITAL_COMMUNITY): Payer: Self-pay | Admitting: Certified Registered Nurse Anesthetist

## 2017-06-24 MED ORDER — TRANEXAMIC ACID 1000 MG/10ML IV SOLN
1000.0000 mg | INTRAVENOUS | Status: DC
  Filled 2017-06-24: qty 10

## 2017-06-25 ENCOUNTER — Ambulatory Visit (HOSPITAL_COMMUNITY)
Admission: RE | Admit: 2017-06-25 | Discharge: 2017-06-25 | Disposition: A | Discharge status: Home / Self Care | Payer: Medicare PPO | Source: Ambulatory Visit | Attending: Orthopedic Surgery | Admitting: Orthopedic Surgery

## 2017-06-25 ENCOUNTER — Encounter (HOSPITAL_COMMUNITY)
Admission: RE | Disposition: A | Discharge status: Home / Self Care | Payer: Self-pay | Source: Ambulatory Visit | Attending: Orthopedic Surgery

## 2017-06-25 ENCOUNTER — Encounter (HOSPITAL_COMMUNITY): Payer: Self-pay | Admitting: *Deleted

## 2017-06-25 DIAGNOSIS — E669 Obesity, unspecified: Secondary | ICD-10-CM | POA: Insufficient documentation

## 2017-06-25 DIAGNOSIS — Z79899 Other long term (current) drug therapy: Secondary | ICD-10-CM | POA: Insufficient documentation

## 2017-06-25 DIAGNOSIS — E119 Type 2 diabetes mellitus without complications: Secondary | ICD-10-CM | POA: Diagnosis not present

## 2017-06-25 DIAGNOSIS — M1711 Unilateral primary osteoarthritis, right knee: Secondary | ICD-10-CM | POA: Insufficient documentation

## 2017-06-25 DIAGNOSIS — F5104 Psychophysiologic insomnia: Secondary | ICD-10-CM | POA: Diagnosis not present

## 2017-06-25 DIAGNOSIS — G8929 Other chronic pain: Secondary | ICD-10-CM | POA: Insufficient documentation

## 2017-06-25 DIAGNOSIS — M545 Low back pain: Secondary | ICD-10-CM | POA: Diagnosis not present

## 2017-06-25 DIAGNOSIS — Z7951 Long term (current) use of inhaled steroids: Secondary | ICD-10-CM | POA: Diagnosis not present

## 2017-06-25 DIAGNOSIS — Z6831 Body mass index (BMI) 31.0-31.9, adult: Secondary | ICD-10-CM | POA: Diagnosis not present

## 2017-06-25 DIAGNOSIS — J45909 Unspecified asthma, uncomplicated: Secondary | ICD-10-CM | POA: Insufficient documentation

## 2017-06-25 DIAGNOSIS — Z87891 Personal history of nicotine dependence: Secondary | ICD-10-CM | POA: Insufficient documentation

## 2017-06-25 DIAGNOSIS — I10 Essential (primary) hypertension: Secondary | ICD-10-CM | POA: Diagnosis not present

## 2017-06-25 LAB — GLUCOSE, CAPILLARY: Glucose-Capillary: 157 mg/dL — ABNORMAL HIGH (ref 65–99)

## 2017-06-25 LAB — TYPE AND SCREEN
ABO/RH(D): AB POS
Antibody Screen: NEGATIVE

## 2017-06-25 SURGERY — ARTHROPLASTY, KNEE, TOTAL
Anesthesia: Spinal | Site: Knee | Laterality: Right

## 2017-06-25 MED ORDER — ONDANSETRON HCL 4 MG/2ML IJ SOLN
INTRAMUSCULAR | Status: AC
  Filled 2017-06-25: qty 2

## 2017-06-25 MED ORDER — CEFAZOLIN SODIUM-DEXTROSE 2-4 GM/100ML-% IV SOLN
2.0000 g | INTRAVENOUS | Status: DC
  Filled 2017-06-25: qty 100

## 2017-06-25 MED ORDER — CHLORHEXIDINE GLUCONATE 4 % EX LIQD: 60.0000 mL | Freq: Once | CUTANEOUS | Status: DC

## 2017-06-25 MED ORDER — DEXAMETHASONE SODIUM PHOSPHATE 10 MG/ML IJ SOLN
INTRAMUSCULAR | Status: AC
  Filled 2017-06-25: qty 1

## 2017-06-25 MED ORDER — MIDAZOLAM HCL 2 MG/2ML IJ SOLN
INTRAMUSCULAR | Status: AC
  Filled 2017-06-25: qty 2

## 2017-06-25 MED ORDER — DEXAMETHASONE SODIUM PHOSPHATE 10 MG/ML IJ SOLN: 10.0000 mg | Freq: Once | INTRAMUSCULAR | Status: DC

## 2017-06-25 MED ORDER — PROPOFOL 10 MG/ML IV BOLUS
INTRAVENOUS | Status: AC
  Filled 2017-06-25: qty 40

## 2017-06-25 MED ORDER — FENTANYL CITRATE (PF) 100 MCG/2ML IJ SOLN
50.0000 ug | INTRAMUSCULAR | Status: DC
  Filled 2017-06-25: qty 2

## 2017-06-25 MED ORDER — SODIUM CHLORIDE 0.9 % IV SOLN
INTRAVENOUS | Status: DC
  Administered 2017-06-25: 09:00:00 via INTRAVENOUS

## 2017-06-25 MED ORDER — MIDAZOLAM HCL 2 MG/2ML IJ SOLN
1.0000 mg | INTRAMUSCULAR | Status: DC
  Filled 2017-06-25: qty 2

## 2017-06-25 MED ORDER — LACTATED RINGERS IV SOLN: INTRAVENOUS | Status: DC

## 2017-06-25 NOTE — Discharge Instructions (Signed)
Follow up with your Kidney Doctor and stay in touch with Dr Honor Loh office regarding rescheduling.

## 2017-07-27 ENCOUNTER — Other Ambulatory Visit: Payer: Self-pay | Admitting: Family Medicine

## 2017-07-27 DIAGNOSIS — Z1231 Encounter for screening mammogram for malignant neoplasm of breast: Secondary | ICD-10-CM

## 2017-08-18 ENCOUNTER — Ambulatory Visit
Admission: RE | Admit: 2017-08-18 | Discharge: 2017-08-18 | Disposition: A | Discharge status: Home / Self Care | Payer: Medicare PPO | Source: Ambulatory Visit | Attending: Family Medicine | Admitting: Family Medicine

## 2017-08-18 DIAGNOSIS — Z1231 Encounter for screening mammogram for malignant neoplasm of breast: Secondary | ICD-10-CM

## 2017-09-20 ENCOUNTER — Emergency Department (HOSPITAL_COMMUNITY): Payer: Medicare PPO

## 2017-09-20 ENCOUNTER — Encounter (HOSPITAL_COMMUNITY): Payer: Self-pay

## 2017-09-20 ENCOUNTER — Inpatient Hospital Stay (HOSPITAL_COMMUNITY)
Admission: EM | Admit: 2017-09-20 | Discharge: 2017-09-28 | DRG: 393 | Disposition: A | Discharge status: Home / Self Care | Payer: Medicare PPO | Attending: Internal Medicine | Admitting: Internal Medicine

## 2017-09-20 ENCOUNTER — Other Ambulatory Visit: Payer: Self-pay

## 2017-09-20 DIAGNOSIS — N184 Chronic kidney disease, stage 4 (severe): Secondary | ICD-10-CM

## 2017-09-20 DIAGNOSIS — Z6832 Body mass index (BMI) 32.0-32.9, adult: Secondary | ICD-10-CM

## 2017-09-20 DIAGNOSIS — Z91018 Allergy to other foods: Secondary | ICD-10-CM

## 2017-09-20 DIAGNOSIS — Z7952 Long term (current) use of systemic steroids: Secondary | ICD-10-CM

## 2017-09-20 DIAGNOSIS — K633 Ulcer of intestine: Principal | ICD-10-CM | POA: Diagnosis present

## 2017-09-20 DIAGNOSIS — Z716 Tobacco abuse counseling: Secondary | ICD-10-CM

## 2017-09-20 DIAGNOSIS — N185 Chronic kidney disease, stage 5: Secondary | ICD-10-CM

## 2017-09-20 DIAGNOSIS — Z981 Arthrodesis status: Secondary | ICD-10-CM | POA: Diagnosis not present

## 2017-09-20 DIAGNOSIS — N186 End stage renal disease: Secondary | ICD-10-CM | POA: Diagnosis present

## 2017-09-20 DIAGNOSIS — R51 Headache: Secondary | ICD-10-CM | POA: Diagnosis not present

## 2017-09-20 DIAGNOSIS — K921 Melena: Secondary | ICD-10-CM | POA: Diagnosis not present

## 2017-09-20 DIAGNOSIS — K449 Diaphragmatic hernia without obstruction or gangrene: Secondary | ICD-10-CM | POA: Diagnosis present

## 2017-09-20 DIAGNOSIS — I120 Hypertensive chronic kidney disease with stage 5 chronic kidney disease or end stage renal disease: Secondary | ICD-10-CM | POA: Diagnosis present

## 2017-09-20 DIAGNOSIS — Z794 Long term (current) use of insulin: Secondary | ICD-10-CM

## 2017-09-20 DIAGNOSIS — D649 Anemia, unspecified: Secondary | ICD-10-CM | POA: Diagnosis present

## 2017-09-20 DIAGNOSIS — G8929 Other chronic pain: Secondary | ICD-10-CM | POA: Diagnosis present

## 2017-09-20 DIAGNOSIS — K5909 Other constipation: Secondary | ICD-10-CM | POA: Diagnosis present

## 2017-09-20 DIAGNOSIS — K5900 Constipation, unspecified: Secondary | ICD-10-CM | POA: Diagnosis present

## 2017-09-20 DIAGNOSIS — Z8249 Family history of ischemic heart disease and other diseases of the circulatory system: Secondary | ICD-10-CM

## 2017-09-20 DIAGNOSIS — E1122 Type 2 diabetes mellitus with diabetic chronic kidney disease: Secondary | ICD-10-CM | POA: Diagnosis present

## 2017-09-20 DIAGNOSIS — F5104 Psychophysiologic insomnia: Secondary | ICD-10-CM | POA: Diagnosis present

## 2017-09-20 DIAGNOSIS — K559 Vascular disorder of intestine, unspecified: Secondary | ICD-10-CM | POA: Diagnosis present

## 2017-09-20 DIAGNOSIS — J441 Chronic obstructive pulmonary disease with (acute) exacerbation: Secondary | ICD-10-CM | POA: Diagnosis present

## 2017-09-20 DIAGNOSIS — M545 Low back pain, unspecified: Secondary | ICD-10-CM | POA: Diagnosis present

## 2017-09-20 DIAGNOSIS — Z720 Tobacco use: Secondary | ICD-10-CM | POA: Diagnosis not present

## 2017-09-20 DIAGNOSIS — K922 Gastrointestinal hemorrhage, unspecified: Secondary | ICD-10-CM

## 2017-09-20 DIAGNOSIS — Z885 Allergy status to narcotic agent status: Secondary | ICD-10-CM

## 2017-09-20 DIAGNOSIS — K5730 Diverticulosis of large intestine without perforation or abscess without bleeding: Secondary | ICD-10-CM | POA: Diagnosis present

## 2017-09-20 DIAGNOSIS — J449 Chronic obstructive pulmonary disease, unspecified: Secondary | ICD-10-CM | POA: Diagnosis present

## 2017-09-20 DIAGNOSIS — K219 Gastro-esophageal reflux disease without esophagitis: Secondary | ICD-10-CM | POA: Diagnosis present

## 2017-09-20 DIAGNOSIS — D631 Anemia in chronic kidney disease: Secondary | ICD-10-CM | POA: Diagnosis present

## 2017-09-20 DIAGNOSIS — E669 Obesity, unspecified: Secondary | ICD-10-CM | POA: Diagnosis present

## 2017-09-20 DIAGNOSIS — Z801 Family history of malignant neoplasm of trachea, bronchus and lung: Secondary | ICD-10-CM

## 2017-09-20 DIAGNOSIS — L4050 Arthropathic psoriasis, unspecified: Secondary | ICD-10-CM | POA: Diagnosis present

## 2017-09-20 DIAGNOSIS — F17210 Nicotine dependence, cigarettes, uncomplicated: Secondary | ICD-10-CM | POA: Diagnosis present

## 2017-09-20 DIAGNOSIS — Z888 Allergy status to other drugs, medicaments and biological substances status: Secondary | ICD-10-CM

## 2017-09-20 DIAGNOSIS — E119 Type 2 diabetes mellitus without complications: Secondary | ICD-10-CM

## 2017-09-20 DIAGNOSIS — Z79899 Other long term (current) drug therapy: Secondary | ICD-10-CM

## 2017-09-20 DIAGNOSIS — I10 Essential (primary) hypertension: Secondary | ICD-10-CM | POA: Diagnosis present

## 2017-09-20 DIAGNOSIS — Z886 Allergy status to analgesic agent status: Secondary | ICD-10-CM

## 2017-09-20 DIAGNOSIS — Z7951 Long term (current) use of inhaled steroids: Secondary | ICD-10-CM | POA: Diagnosis not present

## 2017-09-20 DIAGNOSIS — Z833 Family history of diabetes mellitus: Secondary | ICD-10-CM

## 2017-09-20 DIAGNOSIS — D500 Iron deficiency anemia secondary to blood loss (chronic): Secondary | ICD-10-CM | POA: Diagnosis present

## 2017-09-20 DIAGNOSIS — R519 Headache, unspecified: Secondary | ICD-10-CM | POA: Diagnosis present

## 2017-09-20 HISTORY — DX: Chronic obstructive pulmonary disease, unspecified: J44.9

## 2017-09-20 HISTORY — DX: Chronic kidney disease, stage 4 (severe): N18.4

## 2017-09-20 HISTORY — DX: Melena: K92.1

## 2017-09-20 LAB — COMPREHENSIVE METABOLIC PANEL
ALT: 8 U/L (ref 0–44)
AST: 14 U/L — ABNORMAL LOW (ref 15–41)
Albumin: 2.8 g/dL — ABNORMAL LOW (ref 3.5–5.0)
Alkaline Phosphatase: 68 U/L (ref 38–126)
Anion gap: 7 (ref 5–15)
BUN: 57 mg/dL — ABNORMAL HIGH (ref 6–20)
CO2: 22 mmol/L (ref 22–32)
Calcium: 9 mg/dL (ref 8.9–10.3)
Chloride: 112 mmol/L — ABNORMAL HIGH (ref 98–111)
Creatinine, Ser: 6.06 mg/dL — ABNORMAL HIGH (ref 0.44–1.00)
GFR calc Af Amer: 8 mL/min — ABNORMAL LOW (ref >60)
GFR calc non Af Amer: 7 mL/min — ABNORMAL LOW (ref >60)
Glucose, Bld: 118 mg/dL — ABNORMAL HIGH (ref 70–99)
Potassium: 3.6 mmol/L (ref 3.5–5.1)
Sodium: 141 mmol/L (ref 135–145)
Total Bilirubin: 0.4 mg/dL (ref 0.3–1.2)
Total Protein: 6.5 g/dL (ref 6.5–8.1)

## 2017-09-20 LAB — I-STAT CHEM 8, ED
BUN: 57 mg/dL — ABNORMAL HIGH (ref 6–20)
Calcium, Ion: 1.28 mmol/L (ref 1.15–1.40)
Chloride: 112 mmol/L — ABNORMAL HIGH (ref 98–111)
Creatinine, Ser: 6.7 mg/dL — ABNORMAL HIGH (ref 0.44–1.00)
Glucose, Bld: 110 mg/dL — ABNORMAL HIGH (ref 70–99)
HCT: 19 % — ABNORMAL LOW (ref 36.0–46.0)
Hemoglobin: 6.5 g/dL — CL (ref 12.0–15.0)
Potassium: 3.6 mmol/L (ref 3.5–5.1)
Sodium: 141 mmol/L (ref 135–145)
TCO2: 21 mmol/L — ABNORMAL LOW (ref 22–32)

## 2017-09-20 LAB — CBC
HCT: 19.3 % — ABNORMAL LOW (ref 36.0–46.0)
Hemoglobin: 6.3 g/dL — CL (ref 12.0–15.0)
MCH: 29.4 pg (ref 26.0–34.0)
MCHC: 32.6 g/dL (ref 30.0–36.0)
MCV: 90.2 fL (ref 78.0–100.0)
Platelets: 292 10*3/uL (ref 150–400)
RBC: 2.14 MIL/uL — ABNORMAL LOW (ref 3.87–5.11)
RDW: 15.9 % — ABNORMAL HIGH (ref 11.5–15.5)
WBC: 8.8 10*3/uL (ref 4.0–10.5)

## 2017-09-20 LAB — GLUCOSE, CAPILLARY
Glucose-Capillary: 106 mg/dL — ABNORMAL HIGH (ref 70–99)
Glucose-Capillary: 98 mg/dL (ref 70–99)

## 2017-09-20 LAB — PREPARE RBC (CROSSMATCH)

## 2017-09-20 LAB — HEMOGLOBIN AND HEMATOCRIT, BLOOD
HCT: 24.4 % — ABNORMAL LOW (ref 36.0–46.0)
Hemoglobin: 8.2 g/dL — ABNORMAL LOW (ref 12.0–15.0)

## 2017-09-20 LAB — MRSA PCR SCREENING: MRSA by PCR: NEGATIVE

## 2017-09-20 LAB — PREGNANCY, URINE: Preg Test, Ur: NEGATIVE

## 2017-09-20 MED ORDER — SODIUM CHLORIDE 0.9 % IV SOLN
10.0000 mL/h | Freq: Once | INTRAVENOUS | Status: AC
  Administered 2017-09-20: 10 mL/h via INTRAVENOUS

## 2017-09-20 MED ORDER — CLONIDINE HCL 0.2 MG PO TABS
0.2000 mg | ORAL_TABLET | Freq: Two times a day (BID) | ORAL | Status: DC
  Administered 2017-09-20 – 2017-09-28 (×17): 0.2 mg via ORAL
  Filled 2017-09-20: qty 2
  Filled 2017-09-20: qty 1
  Filled 2017-09-20: qty 2
  Filled 2017-09-20: qty 1
  Filled 2017-09-20 (×2): qty 2
  Filled 2017-09-20: qty 1
  Filled 2017-09-20: qty 2
  Filled 2017-09-20: qty 1
  Filled 2017-09-20: qty 2
  Filled 2017-09-20: qty 1
  Filled 2017-09-20 (×2): qty 2
  Filled 2017-09-20 (×2): qty 1
  Filled 2017-09-20: qty 2
  Filled 2017-09-20: qty 1

## 2017-09-20 MED ORDER — SODIUM CHLORIDE 0.9% IV SOLUTION
Freq: Once | INTRAVENOUS | Status: AC
  Administered 2017-09-20: 12:00:00 via INTRAVENOUS

## 2017-09-20 MED ORDER — PREDNISONE 5 MG PO TABS
2.5000 mg | ORAL_TABLET | Freq: Every day | ORAL | Status: DC
  Administered 2017-09-21 – 2017-09-28 (×8): 2.5 mg via ORAL
  Filled 2017-09-20 (×8): qty 1

## 2017-09-20 MED ORDER — FAMOTIDINE IN NACL 20-0.9 MG/50ML-% IV SOLN
20.0000 mg | Freq: Two times a day (BID) | INTRAVENOUS | Status: DC
  Administered 2017-09-20 (×2): 20 mg via INTRAVENOUS
  Filled 2017-09-20 (×2): qty 50

## 2017-09-20 MED ORDER — OXYCODONE HCL 5 MG PO TABS
15.0000 mg | ORAL_TABLET | Freq: Four times a day (QID) | ORAL | Status: DC | PRN
  Administered 2017-09-22 – 2017-09-26 (×3): 15 mg via ORAL
  Filled 2017-09-20 (×4): qty 3

## 2017-09-20 MED ORDER — SODIUM CHLORIDE 0.9 % IV SOLN
250.0000 mL | INTRAVENOUS | Status: DC | PRN
  Administered 2017-09-20: 12:00:00 via INTRAVENOUS
  Administered 2017-09-25 (×2): 250 mL via INTRAVENOUS

## 2017-09-20 MED ORDER — ONDANSETRON HCL 4 MG/2ML IJ SOLN
4.0000 mg | Freq: Four times a day (QID) | INTRAMUSCULAR | Status: DC | PRN
  Administered 2017-09-22: 4 mg via INTRAVENOUS
  Filled 2017-09-20: qty 2

## 2017-09-20 MED ORDER — IPRATROPIUM BROMIDE 0.02 % IN SOLN: 0.5000 mg | Freq: Four times a day (QID) | RESPIRATORY_TRACT | Status: DC | PRN

## 2017-09-20 MED ORDER — ZOLPIDEM TARTRATE 5 MG PO TABS
5.0000 mg | ORAL_TABLET | Freq: Every day | ORAL | Status: DC
  Administered 2017-09-20 – 2017-09-27 (×8): 5 mg via ORAL
  Filled 2017-09-20 (×8): qty 1

## 2017-09-20 MED ORDER — FUROSEMIDE 40 MG PO TABS
40.0000 mg | ORAL_TABLET | Freq: Every day | ORAL | Status: DC
  Administered 2017-09-20 – 2017-09-22 (×3): 40 mg via ORAL
  Filled 2017-09-20 (×3): qty 1

## 2017-09-20 MED ORDER — NICOTINE 21 MG/24HR TD PT24: 21.0000 mg | MEDICATED_PATCH | Freq: Every day | TRANSDERMAL | Status: DC | PRN

## 2017-09-20 MED ORDER — SODIUM CHLORIDE 0.9% FLUSH
3.0000 mL | Freq: Two times a day (BID) | INTRAVENOUS | Status: DC
  Administered 2017-09-20 – 2017-09-28 (×15): 3 mL via INTRAVENOUS

## 2017-09-20 MED ORDER — ONDANSETRON HCL 4 MG PO TABS: 4.0000 mg | ORAL_TABLET | Freq: Four times a day (QID) | ORAL | Status: DC | PRN

## 2017-09-20 MED ORDER — ACETAMINOPHEN 325 MG PO TABS
650.0000 mg | ORAL_TABLET | Freq: Four times a day (QID) | ORAL | Status: DC | PRN
  Administered 2017-09-21 – 2017-09-28 (×17): 650 mg via ORAL
  Filled 2017-09-20 (×18): qty 2

## 2017-09-20 MED ORDER — SODIUM CHLORIDE 0.9% FLUSH: 3.0000 mL | INTRAVENOUS | Status: DC | PRN

## 2017-09-20 MED ORDER — INSULIN ASPART 100 UNIT/ML ~~LOC~~ SOLN: 0.0000 [IU] | Freq: Three times a day (TID) | SUBCUTANEOUS | Status: DC

## 2017-09-20 MED ORDER — ACETAMINOPHEN 650 MG RE SUPP: 650.0000 mg | Freq: Four times a day (QID) | RECTAL | Status: DC | PRN

## 2017-09-20 MED ORDER — ALBUTEROL SULFATE (2.5 MG/3ML) 0.083% IN NEBU: 2.5000 mg | INHALATION_SOLUTION | Freq: Four times a day (QID) | RESPIRATORY_TRACT | Status: DC | PRN

## 2017-09-20 NOTE — ED Provider Notes (Signed)
[place] DEPT Provider Note   CSN: [id number] Arrival date & time: 09/20/17  0757     History   Chief Complaint Chief Complaint  Patient presents with  . Rectal Bleeding  . Abdominal Cramping    HPI Jodi Bell is a 57 y.o. female.  The history is provided by the patient. No language interpreter was used.  Rectal Bleeding  Abdominal Cramping    Jodi Bell is a 57 y.o. female who presents to the Emergency Department complaining of rectal bleeding. She presents to the emergency department complaining of rectal bleeding that began this morning. She has a history of CKD, chronic anemia, chronic immunosuppression and chronic constipation. She took a Doctor, general practice last night due to constipation and nausea. Around 6 AM this morning she developed bloodied bowel movements times three. She reports lower abdominal cramping as well as associated nausea. She denies any fevers, chest pain, lightheadedness. She denies any similar symptoms in the past but has required blood transfusions due to anemia. Past Medical History:  Diagnosis Date  . Anemia   . Arthritis    oa and psoriatic ra  . Asthma   . Back pain, chronic    lower back  . Candida infection finished tx 2 days ago  . Chronic insomnia   . Chronic kidney disease    stage 3 ckd last ov nephrology 04-29-17 epic  . Chronic kidney disease (CKD) stage G4/A1, severely decreased glomerular filtration rate (GFR) between 15-29 mL/min/1.73 square meter and albuminuria creatinine ratio less than 30 mg/g (HCC)   . COPD (chronic obstructive pulmonary disease) ([place])   . Diabetes mellitus    type 2  . Eczema   . Essential hypertension   . GERD (gastroesophageal reflux disease)   . History of hiatal hernia   . Obesity   . Pneumonia few yrs ago x 2    Patient Active Problem List   Diagnosis Date Noted  . Hematochezia 09/20/2017  . GERD (gastroesophageal reflux disease) 09/20/2017  . CKD stage 5  due to type 2 diabetes mellitus ([place]) 09/20/2017  . COPD (chronic obstructive pulmonary disease) ([place]) 09/20/2017  . Tobacco use 09/20/2017  . Symptomatic anemia 09/20/2017  . Headache 09/20/2017  . Chronic kidney disease (CKD) stage G4/A1, severely decreased glomerular filtration rate (GFR) between 15-29 mL/min/1.73 square meter and albuminuria creatinine ratio less than 30 mg/g (HCC)   . Serotonin syndrome 09/28/2013  . Respiratory failure ([place]) 09/28/2013  . Encephalopathy acute 09/28/2013  . Hypotension, unspecified 09/28/2013  . 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 Type 2 diabetes mellitus ([place]) 03/06/2008  . INSOMNIA, CHRONIC 03/06/2008  . Essential hypertension 03/06/2008  . ASTHMA 03/06/2008  . ECZEMA 03/06/2008  . BACK PAIN, LUMBAR, CHRONIC 03/06/2008    Past Surgical History:  Procedure Laterality Date  . BACK SURGERY  2016   lower back fusion with cage  . [place]   x 1   . DILATION AND CURETTAGE OF UTERUS  1988  . TUBAL LIGATION  1990     OB History   None      Home Medications    Prior to Admission medications   Medication Sig Start Date End Date Taking? Authorizing Provider  allopurinol (ZYLOPRIM) 300 MG tablet Take 150 mg by mouth every evening.    Yes [provider]  amLODipine (NORVASC) 10 MG tablet Take 10 mg by mouth daily.   Yes [provider]  bisoprolol (ZEBETA) 10 MG tablet Take  20 mg by mouth daily. Takes at 100 pm   Yes [provider]  cetirizine (ZYRTEC) 10 MG tablet Take 20 mg by mouth 2 (two) times daily.  06/13/17  Yes Shelda Pal, DO  cloNIDine (CATAPRES) 0.2 MG tablet Take 0.2 mg by mouth 2 (two) times daily.  07/15/15  Yes [provider]  diclofenac sodium (VOLTAREN) 1 % GEL Apply 2 g topically daily as needed (joint pain).    Yes [provider]  EPINEPHrine (EPIPEN) 0.3 mg/0.3 mL SOAJ injection Inject 0.3 mLs (0.3 mg total) into the muscle once. 09/30/12  Yes Le,  Thao P, DO  esomeprazole (NEXIUM) 20 MG capsule Take 20 mg by mouth every evening.    Yes [provider]  etanercept (ENBREL) 50 MG/ML injection Inject 50 mg into the skin once a week.    Yes [provider]  ferrous sulfate 325 (65 FE) MG tablet Take 325 mg by mouth daily.    Yes [provider]  fluticasone (FLONASE) 50 MCG/ACT nasal spray Place 1 spray into both nostrils daily.    Yes [provider]  folic acid (FOLVITE) 094 MCG tablet Take 0.4 mg by mouth daily.   Yes [provider]  furosemide (LASIX) 40 MG tablet Take 40 mg by mouth daily.   Yes [provider]  gabapentin (NEURONTIN) 100 MG capsule Take 100 mg by mouth at bedtime as needed (nerve pain).   Yes [provider]  hydrALAZINE (APRESOLINE) 25 MG tablet Take 1 tablet by mouth 4 (four) times daily. 06/26/16  Yes [provider]  Insulin Degludec (TRESIBA FLEXTOUCH) 200 UNIT/ML SOPN Inject 14 Units into the skin every evening.    Yes [provider]  JANUVIA 100 MG tablet Take 100 mg by mouth daily.  08/09/15  Yes [provider]  montelukast (SINGULAIR) 10 MG tablet Take 10 mg by mouth at bedtime.   Yes [provider]  Multiple Vitamin (MULTIVITAMIN) tablet Take 1 tablet by mouth daily.   Yes [provider]  oxyCODONE (ROXICODONE) 15 MG immediate release tablet Take 15 mg by mouth 4 (four) times daily as needed for pain. For pain.   Yes [provider]  predniSONE (DELTASONE) 2.5 MG tablet Take 2.5 mg by mouth daily with breakfast.   Yes [provider]  Probiotic Product (PROBIOTIC DAILY PO) Take 1 tablet by mouth daily.   Yes [provider]  promethazine (PHENERGAN) 12.5 MG tablet Take 12.5 mg by mouth daily as needed for nausea or vomiting.  08/17/17  Yes [provider]  ranitidine (ZANTAC) 300 MG tablet Take 300 mg by mouth 2 (two) times daily. 08/15/15  Yes [provider]    senna-docusate (PERI-COLACE) 8.6-50 MG tablet Take 1 tablet by mouth daily as needed for mild constipation.   Yes [provider]  VENTOLIN HFA 108 (90 Base) MCG/ACT inhaler Inhale 1 puff into the lungs every 6 (six) hours as needed for shortness of breath. wheezing 06/13/15  Yes [provider]  Vitamin D, Ergocalciferol, (DRISDOL) 50000 units CAPS capsule Take 1 capsule by mouth once a week. On Friday 09/15/17  Yes [provider]  zolpidem (AMBIEN) 10 MG tablet Take 10 mg by mouth at bedtime.    Yes [provider]    Family History Family History  Problem Relation Age of Onset  . Lung cancer Mother   . Diabetes Sister   . Heart disease Sister   . Thyroid cancer Unknown  1/2 sister  . Heart disease Unknown        1/2 sister    Social History Social History   Tobacco Use  . Smoking status: Current Every Day Smoker    Packs/day: 0.50    Years: 40.00    Pack years: 20.00    Types: Cigarettes    Last attempt to quit: 02/03/2014    Years since quitting: 3.6  . Smokeless tobacco: Never Used  Substance Use Topics  . Alcohol use: Yes    Comment: occ  . Drug use: No     Allergies   Adalimumab; Ibuprofen; Leflunomide and related; Lexapro [escitalopram oxalate]; Lisinopril; Other; Pistachio nut (diagnostic); Statins; and Trazodone and nefazodone   Review of Systems Review of Systems  Gastrointestinal: Positive for hematochezia.  All other systems reviewed and are negative.    Physical Exam Updated Vital Signs BP 132/81   Pulse 79   Temp 98.3 F (36.8 C) (Oral)   Resp 17   Ht 5\' 2"  (1.575 m)   Wt 80.7 kg   SpO2 99%   BMI 32.56 kg/m   Physical Exam  Constitutional: She is oriented to person, place, and time. She appears well-developed and well-nourished.  HENT:  Head: Normocephalic and atraumatic.  Cardiovascular: Normal rate and regular rhythm.  No murmur heard. Pulmonary/Chest: Effort normal and breath sounds normal.  No respiratory distress.  Abdominal: Soft. There is no rebound and no guarding.  Mild lower abdominal tenderness without guarding/rebound  Musculoskeletal: She exhibits no edema or tenderness.  Neurological: She is alert and oriented to person, place, and time.  Skin: Skin is warm and dry.  Psychiatric: She has a normal mood and affect. Her behavior is normal.  Nursing note and vitals reviewed.    ED Treatments / Results  Labs (all labs ordered are listed, but only abnormal results are displayed) Labs Reviewed  COMPREHENSIVE METABOLIC PANEL - Abnormal; Notable for the following components:      Result Value   Chloride 112 (*)    Glucose, Bld 118 (*)    BUN 57 (*)    Creatinine, Ser 6.06 (*)    Albumin 2.8 (*)    AST 14 (*)    GFR calc non Af Amer 7 (*)    GFR calc Af Amer 8 (*)    All other components within normal limits  CBC - Abnormal; Notable for the following components:   RBC 2.14 (*)    Hemoglobin 6.3 (*)    HCT 19.3 (*)    RDW 15.9 (*)    All other components within normal limits  I-STAT CHEM 8, ED - Abnormal; Notable for the following components:   Chloride 112 (*)    BUN 57 (*)    Creatinine, Ser 6.70 (*)    Glucose, Bld 110 (*)    TCO2 21 (*)    Hemoglobin 6.5 (*)    HCT 19.0 (*)    All other components within normal limits  MRSA PCR SCREENING  PREGNANCY, URINE  HEMOGLOBIN AND HEMATOCRIT, BLOOD  HEMOGLOBIN AND HEMATOCRIT, BLOOD  POC OCCULT BLOOD, ED  TYPE AND SCREEN  PREPARE RBC (CROSSMATCH)  PREPARE RBC (CROSSMATCH)    EKG None  Radiology Dg Chest Port 1 View  Result Date: 09/20/2017 CLINICAL DATA:  Rectal bleeding.  Chronic kidney disease. EXAM: PORTABLE CHEST 1 VIEW COMPARISON:  05/31/2016 FINDINGS: The heart size and mediastinal contours are within normal limits. Both lungs are clear. The visualized skeletal structures are unremarkable. IMPRESSION: No  active disease. Electronically Signed   By: Kathreen Devoid   On: 09/20/2017 09:19     Procedures Procedures (including critical care time) CRITICAL CARE Performed by: Quintella Reichert   Total critical care time: 45 minutes  Critical care time was exclusive of separately billable procedures and treating other patients.  Critical care was necessary to treat or prevent imminent or life-threatening deterioration.  Critical care was time spent personally by me on the following activities: development of treatment plan with patient and/or surrogate as well as nursing, discussions with consultants, evaluation of patient's response to treatment, examination of patient, obtaining history from patient or surrogate, ordering and performing treatments and interventions, ordering and review of laboratory studies, ordering and review of radiographic studies, pulse oximetry and re-evaluation of patient's condition.  Medications Ordered in ED Medications  famotidine (PEPCID) IVPB 20 mg premix (has no administration in time range)  ondansetron (ZOFRAN) tablet 4 mg (has no administration in time range)    Or  ondansetron (ZOFRAN) injection 4 mg (has no administration in time range)  sodium chloride flush (NS) 0.9 % injection 3 mL (3 mLs Intravenous Given 09/20/17 1154)  sodium chloride flush (NS) 0.9 % injection 3 mL (has no administration in time range)  0.9 %  sodium chloride infusion (has no administration in time range)  acetaminophen (TYLENOL) tablet 650 mg (has no administration in time range)    Or  acetaminophen (TYLENOL) suppository 650 mg (has no administration in time range)  ipratropium (ATROVENT) nebulizer solution 0.5 mg (has no administration in time range)  albuterol (PROVENTIL) (2.5 MG/3ML) 0.083% nebulizer solution 2.5 mg (has no administration in time range)  cloNIDine (CATAPRES) tablet 0.2 mg (has no administration in time range)  oxyCODONE (Oxy IR/ROXICODONE) immediate release tablet 15 mg (has no administration in time range)  furosemide (LASIX) tablet 40 mg (has  no administration in time range)  predniSONE (DELTASONE) tablet 2.5 mg (has no administration in time range)  zolpidem (AMBIEN) tablet 5 mg (has no administration in time range)  insulin aspart (novoLOG) injection 0-9 Units (has no administration in time range)  nicotine (NICODERM CQ - dosed in mg/24 hours) patch 21 mg (has no administration in time range)  0.9 %  sodium chloride infusion (0 mL/hr Intravenous Stopping Infusion hung by another clincian 09/20/17 1145)  0.9 %  sodium chloride infusion (Manually program via Guardrails IV Fluids) ( Intravenous New Bag/Given 09/20/17 1133)     Initial Impression / Assessment and Plan / ED Course  I have reviewed the triage vital signs and the nursing notes.  Pertinent labs & imaging results that were available during my care of the patient were reviewed by me and considered in my medical decision making (see chart for details).     Patient here for evaluation of hematochezia starting this morning. In the emergency department just after arrival she had three large blood he bowel movements that are predominantly maroon color clots. Hemoglobin is dropped by 2 g compared to prior. Given change in hemoglobin as well as significant amount of blood in the emergency department will transfuse. She has CKD with a baseline creatinine around five, increased to six today. No evidence of volume overload on examination. Discussed with Dr. Michail Sermon with [facility] who will see the patient and consult. Discussed with intensivist - recommend hospitalist admission and they can consult critical care if needed. Hospitalist consulted for admission. Patient updated of critical nature of illness and recommendation for admission blood transfusion and she is in agreement  with treatment plan.  Final Clinical Impressions(s) / ED Diagnoses   Final diagnoses:  Lower GI bleed    ED Discharge Orders    None       Quintella Reichert, MD 09/20/17 1159

## 2017-09-20 NOTE — Consult Note (Signed)
Referring Provider: Dr. Ralene Bathe Primary Care Physician:  [facility] Primary Gastroenterologist:  Althia Forts  Reason for Consultation:  Rectal bleeding  HPI: Jodi Bell is a 57 y.o. female with CKD, GERD, Type 2 Dm, obesity who had the acute onset of red blood per rectum that occurred 3 times at home and 3 times in the ER. Large volume with clots. Lower abdominal pain that occurred with the bleeding. Nausea without vomiting. Denies dizziness. Chronic GERD on PPI therapy and reports having an EGD in 2/19 at [facility] that showed Candida esophagitis (records not available at this time). Reports a normal colonoscopy several years ago at [facility] (records not available). History of anemia with baseline reportedly in the 8 range. Hgb 6.5 today. Hemodynamically stable. Took 2 Alka-Seltzers recently and denies other NSAIDs.  Past Medical History:  Diagnosis Date  . Anemia   . Arthritis    oa and psoriatic ra  . Asthma   . Back pain, chronic    lower back  . Candida infection finished tx 2 days ago  . Chronic insomnia   . Chronic kidney disease    stage 3 ckd last ov nephrology 04-29-17 epic  . Chronic kidney disease (CKD) stage G4/A1, severely decreased glomerular filtration rate (GFR) between 15-29 mL/min/1.73 square meter and albuminuria creatinine ratio less than 30 mg/g (HCC)   . COPD (chronic obstructive pulmonary disease) ([place])   . Diabetes mellitus    type 2  . Eczema   . Essential hypertension   . GERD (gastroesophageal reflux disease)   . History of hiatal hernia   . Obesity   . Pneumonia few yrs ago x 2    Past Surgical History:  Procedure Laterality Date  . BACK SURGERY  2016   lower back fusion with cage  . [place]   x [place]    Prior to Admission medications   Medication Sig Start Date End Date Taking? Authorizing Provider  allopurinol (ZYLOPRIM) 300 MG tablet Take 150 mg by mouth every evening.    Yes [provider]   amLODipine (NORVASC) 10 MG tablet Take 10 mg by mouth daily.   Yes [provider]  bisoprolol (ZEBETA) 10 MG tablet Take 20 mg by mouth daily. Takes at 100 pm   Yes [provider]  cetirizine (ZYRTEC) 10 MG tablet Take 20 mg by mouth 2 (two) times daily.  06/13/17  Yes Shelda Pal, DO  cloNIDine (CATAPRES) 0.2 MG tablet Take 0.2 mg by mouth 2 (two) times daily.  07/15/15  Yes [provider]  diclofenac sodium (VOLTAREN) 1 % GEL Apply 2 g topically daily as needed (joint pain).    Yes [provider]  EPINEPHrine (EPIPEN) 0.3 mg/0.3 mL SOAJ injection Inject 0.3 mLs (0.3 mg total) into the muscle once. 09/30/12  Yes Le, Thao P, DO  esomeprazole (NEXIUM) 20 MG capsule Take 20 mg by mouth every evening.    Yes [provider]  etanercept (ENBREL) 50 MG/ML injection Inject 50 mg into the skin once a week.    Yes [provider]  ferrous sulfate 325 (65 FE) MG tablet Take 325 mg by mouth daily.    Yes [provider]  fluticasone (FLONASE) 50 MCG/ACT nasal spray Place 1 spray into both nostrils daily.    Yes [provider]  folic acid (FOLVITE) 892 MCG tablet Take 0.4 mg by mouth daily.   Yes [provider]  furosemide (LASIX) 40 MG tablet Take 40 mg by mouth daily.   Yes [provider]  gabapentin (NEURONTIN) 100 MG capsule Take 100 mg by mouth at bedtime as needed (nerve pain).   Yes [provider]  hydrALAZINE (APRESOLINE) 25 MG tablet Take 1 tablet by mouth 4 (four) times daily. 06/26/16  Yes [provider]  Insulin Degludec (TRESIBA FLEXTOUCH) 200 UNIT/ML SOPN Inject 14 Units into the skin every evening.    Yes [provider]  JANUVIA 100 MG tablet Take 100 mg by mouth daily.  08/09/15  Yes [provider]  montelukast (SINGULAIR) 10 MG tablet Take 10 mg by mouth at bedtime.   Yes [provider]  Multiple Vitamin (MULTIVITAMIN) tablet Take 1 tablet by  mouth daily.   Yes [provider]  oxyCODONE (ROXICODONE) 15 MG immediate release tablet Take 15 mg by mouth 4 (four) times daily as needed for pain. For pain.   Yes [provider]  predniSONE (DELTASONE) 2.5 MG tablet Take 2.5 mg by mouth daily with breakfast.   Yes [provider]  Probiotic Product (PROBIOTIC DAILY PO) Take 1 tablet by mouth daily.   Yes [provider]  promethazine (PHENERGAN) 12.5 MG tablet Take 12.5 mg by mouth daily as needed for nausea or vomiting.  08/17/17  Yes [provider]  ranitidine (ZANTAC) 300 MG tablet Take 300 mg by mouth 2 (two) times daily. 08/15/15  Yes [provider]  senna-docusate (PERI-COLACE) 8.6-50 MG tablet Take 1 tablet by mouth daily as needed for mild constipation.   Yes [provider]  VENTOLIN HFA 108 (90 Base) MCG/ACT inhaler Inhale 1 puff into the lungs every 6 (six) hours as needed for shortness of breath. wheezing 06/13/15  Yes [provider]  Vitamin D, Ergocalciferol, (DRISDOL) 50000 units CAPS capsule Take 1 capsule by mouth once a week. On Friday 09/15/17  Yes [provider]  zolpidem (AMBIEN) 10 MG tablet Take 10 mg by mouth at bedtime.    Yes [provider]    Scheduled Meds: . sodium chloride   Intravenous Once   Continuous Infusions: . famotidine (PEPCID) IV     PRN Meds:.  Allergies as of 09/20/2017 - Review Complete 09/20/2017  Allergen Reaction Noted  . Adalimumab Other (See Comments) 09/04/2014  . Ibuprofen Other (See Comments) 09/04/2014  . Leflunomide and related Other (See Comments) 09/04/2014  . Lexapro [escitalopram oxalate] Other (See Comments) 09/29/2013  . Lisinopril Swelling 01/05/2014  . Other  06/19/2017  . Pistachio nut (diagnostic)  06/19/2017  . Statins Other (See Comments) 09/04/2014  . Trazodone and nefazodone Other (See Comments) 09/04/2014    Family History  Problem Relation Age of Onset  . Lung cancer Mother    . Diabetes Sister   . Heart disease Sister   . Thyroid cancer Unknown        1/2 sister  . Heart disease Unknown        1/2 sister    Social History   Socioeconomic History  . Marital status: Divorced    Spouse name: Not on file  . Number of children: Not on file  . Years of education: Not on file  . Highest education level: Not on file  Occupational History  . Not on file  Social Needs  . Financial resource strain: Not on file  . Food insecurity:    Worry: Not on file    Inability: Not on file  . Transportation needs:  Medical: Not on file    Non-medical: Not on file  Tobacco Use  . Smoking status: Current Every Day Smoker    Packs/day: 0.50    Years: 40.00    Pack years: 20.00    Types: Cigarettes    Last attempt to quit: 02/03/2014    Years since quitting: 3.6  . Smokeless tobacco: Never Used  Substance and Sexual Activity  . Alcohol use: Yes    Comment: occ  . Drug use: No  . Sexual activity: Yes    Birth control/protection: Surgical  Lifestyle  . Physical activity:    Days per week: Not on file    Minutes per session: Not on file  . Stress: Not on file  Relationships  . Social connections:    Talks on phone: Not on file    Gets together: Not on file    Attends religious service: Not on file    Active member of club or organization: Not on file    Attends meetings of clubs or organizations: Not on file    Relationship status: Not on file  . Intimate partner violence:    Fear of current or ex partner: Not on file    Emotionally abused: Not on file    Physically abused: Not on file    Forced sexual activity: Not on file  Other Topics Concern  . Not on file  Social History Narrative  . Not on file    Review of Systems: All negative except as stated above in HPI.  Physical Exam: Vital signs: Vitals:   09/20/17 0804 09/20/17 0930  BP: 136/81 130/76  Pulse: 80 79  Resp: 16   Temp: 98 F (36.7 C)   SpO2: 97% 99%     General:    Lethargic, obese, no acute distress Head: normocephalic, atraumatic Eyes: anicteric sclera ENT: oropharynx clear Neck: supple, nontender Lungs:  Clear throughout to auscultation.   No wheezes, crackles, or rhonchi. No acute distress. Heart:  Regular rate and rhythm; no murmurs, clicks, rubs,  or gallops. Abdomen: LLQ tenderness with guarding, otherwise nontender, soft, nondistended, +BS  Rectal:  Deferred Ext: no edema  GI:  Lab Results: Recent Labs    09/20/17 0818 09/20/17 0858  WBC 8.8  --   HGB 6.3* 6.5*  HCT 19.3* 19.0*  PLT 292  --    BMET Recent Labs    09/20/17 0818 09/20/17 0858  NA 141 141  K 3.6 3.6  CL 112* 112*  CO2 22  --   GLUCOSE 118* 110*  BUN 57* 57*  CREATININE 6.06* 6.70*  CALCIUM 9.0  --    LFT Recent Labs    09/20/17 0818  PROT 6.5  ALBUMIN 2.8*  AST 14*  ALT 8  ALKPHOS 68  BILITOT 0.4   PT/INR No results for input(s): LABPROT, INR in the last 72 hours.   Studies/Results: Dg Chest Port 1 View  Result Date: 09/20/2017 CLINICAL DATA:  Rectal bleeding.  Chronic kidney disease. EXAM: PORTABLE CHEST 1 VIEW COMPARISON:  05/31/2016 FINDINGS: The heart size and mediastinal contours are within normal limits. Both lungs are clear. The visualized skeletal structures are unremarkable. IMPRESSION: No active disease. Electronically Signed   By: Kathreen Devoid   On: 09/20/2017 09:19    Impression/Plan: Rectal bleeding suspect diverticular source. Doubt upper tract source. Agree with blood transfusions. Ice chips and sips of water ok. May need an updated colonoscopy as inpatient but will see how bleeding progresses and Dr. Penelope Coop  will re-evaluate tomorrow and decide on timing of a repeat colonoscopy.    LOS: 0 days   Anselmo C.  09/20/2017, 10:22 AM  Questions please call [phone]

## 2017-09-20 NOTE — Plan of Care (Signed)
  Problem: Pain Managment: Goal: General experience of comfort will improve Outcome: Progressing   

## 2017-09-20 NOTE — Significant Event (Signed)
Pulmonary Critical Care:  Initially called for admission by ED physician. Deferred admission to Hospitalist service. On chart review, there are no critical care issues.  Please re-consult if you require our assistance.  Kipp Brood, MD [facility] ICU Physician [place]  Pager: [phone] Mobile: [phone] After hours: [phone].

## 2017-09-20 NOTE — ED Triage Notes (Signed)
Per EMS: Pt has rectal bleeding since this am. Pt states she has lost about 2 cups of blood since this am.  Pt took a medication to help her have a BM, but now she has diarrhea.  Pt hx of diabetes.

## 2017-09-20 NOTE — H&P (Signed)
History and Physical    Jodi Bell:[id number] DOB: May 06, 1960 DOA: 09/20/2017  PCP: [facility] [place]   Patient coming from: Home.  I have personally briefly reviewed patient's old medical records in [place]  Chief Complaint: Rectal bleeding.  HPI: Jodi Bell is a 57 y.o. female with medical history significant of ESRD, anemia, osteoarthritis and psoriatic arthritis, asthma/COPD, history of Candida esophagitis, chronic back pain, chronic insomnia, type 2 diabetes, eczema, essential hypertension, GERD, hiatal hernia, obesity, history of pneumonia who is coming to the emergency department with complaints of hematochezia since early this morning.  She had 3 episodes of bloody bowel movements at home in the morning the emergency department.  She complains of crampy left lower quadrant pain as well.  She felt mildly lightheaded when she was boarding in the ambulance.  She feels mildly dyspneic and fatigued.She denies regular NSAIDs use.  However, she mentioned having Alka-Seltzer yesterday for headache.  She denies nausea, emesis, diarrhea, but gets frequently constipated.  She has frequent melena, but she attributes this to her iron supplementation.    She denies fever, chills, rhinorrhea, sore throat, wheezing, hemoptysis, chest pain, palpitations, diaphoresis, PND, orthopnea or recent pitting edema lower extremities.  She urinates several times a day despite ESRD.  She denies dysuria, frequency or hematuria.  No heat or cold intolerance.  No polyuria, polydipsia, polyphagia or blurred vision.  ED Course: Initial vital signs temperature 98 F, pulse 80, respirations 16, blood pressure 136/81 mmHg and O2 sat 97% on room air.  A 2 units of packed RBC transfusion was started in the emergency department.  Her white count was 8.0, hemoglobin 6.3 g/dL (usually around 8.5 g/dL) and platelets 292.  Sodium 141, potassium 3.6, chloride 112 and CO2 22 mmol/L.  Her BUN was 57,  creatinine 6.06, glucose 118 and calcium 9.0 mg/dL.  Total protein 6.5 and albumin 2.8 g/dL.  AST mildly decreased at 14 units/L.  The rest of the LFTs were unremarkable.  Review of Systems: As per HPI otherwise 10 point review of systems negative.   Past Medical History:  Diagnosis Date  . Anemia   . Arthritis    oa and psoriatic ra  . Asthma   . Back pain, chronic    lower back  . Candida infection finished tx 2 days ago  . Chronic insomnia   . Chronic kidney disease    stage 3 ckd last ov nephrology 04-29-17 epic  . Chronic kidney disease (CKD) stage G4/A1, severely decreased glomerular filtration rate (GFR) between 15-29 mL/min/1.73 square meter and albuminuria creatinine ratio less than 30 mg/g (HCC)   . COPD (chronic obstructive pulmonary disease) ([place])   . Diabetes mellitus    type 2  . Eczema   . Essential hypertension   . GERD (gastroesophageal reflux disease)   . History of hiatal hernia   . Obesity   . Pneumonia few yrs ago x 2    Past Surgical History:  Procedure Laterality Date  . BACK SURGERY  2016   lower back fusion with cage  . [place]   x 1   . DILATION AND CURETTAGE OF UTERUS  1988  . TUBAL LIGATION  1990     reports that she has been smoking cigarettes. She has a 20.00 pack-year smoking history. She has never used smokeless tobacco. She reports that she drinks alcohol. She reports that she does not use drugs.  Allergies  Allergen Reactions  . Adalimumab Other (  See Comments)    Blisters on abdomen =humira  . Ibuprofen Other (See Comments)    Renal Problems  . Leflunomide And Related Other (See Comments)    Severe Headache  . Lexapro [Escitalopram Oxalate] Other (See Comments)    Hand problems - Serotonin Syndrome   . Lisinopril Swelling    Angioedema   . Other     Cosentyx= angioedema   . Pistachio Nut (Diagnostic)     Mouth itches and tongue swelling Hazel nuts and pecans also  . Statins Other (See Comments)    Leg pains and  weakness  . Trazodone And Nefazodone Other (See Comments)    Serotonin Syndrome     Family History  Problem Relation Age of Onset  . Lung cancer Mother   . Diabetes Sister   . Heart disease Sister   . Thyroid cancer Unknown        1/2 sister  . Heart disease Unknown        1/2 sister    Prior to Admission medications   Medication Sig Start Date End Date Taking? Authorizing Provider  allopurinol (ZYLOPRIM) 300 MG tablet Take 150 mg by mouth every evening.    Yes [provider]  amLODipine (NORVASC) 10 MG tablet Take 10 mg by mouth daily.   Yes [provider]  bisoprolol (ZEBETA) 10 MG tablet Take 20 mg by mouth daily. Takes at 100 pm   Yes [provider]  cetirizine (ZYRTEC) 10 MG tablet Take 20 mg by mouth 2 (two) times daily.  06/13/17  Yes Shelda Pal, DO  cloNIDine (CATAPRES) 0.2 MG tablet Take 0.2 mg by mouth 2 (two) times daily.  07/15/15  Yes [provider]  diclofenac sodium (VOLTAREN) 1 % GEL Apply 2 g topically daily as needed (joint pain).    Yes [provider]  EPINEPHrine (EPIPEN) 0.3 mg/0.3 mL SOAJ injection Inject 0.3 mLs (0.3 mg total) into the muscle once. 09/30/12  Yes Le, Thao P, DO  esomeprazole (NEXIUM) 20 MG capsule Take 20 mg by mouth every evening.    Yes [provider]  etanercept (ENBREL) 50 MG/ML injection Inject 50 mg into the skin once a week.    Yes [provider]  ferrous sulfate 325 (65 FE) MG tablet Take 325 mg by mouth daily.    Yes [provider]  fluticasone (FLONASE) 50 MCG/ACT nasal spray Place 1 spray into both nostrils daily.    Yes [provider]  folic acid (FOLVITE) 094 MCG tablet Take 0.4 mg by mouth daily.   Yes [provider]  furosemide (LASIX) 40 MG tablet Take 40 mg by mouth daily.   Yes [provider]  gabapentin (NEURONTIN) 100 MG capsule Take 100 mg by mouth at bedtime as needed (nerve pain).   Yes [provider]  hydrALAZINE (APRESOLINE) 25 MG tablet Take 1 tablet by mouth 4 (four) times daily. 06/26/16  Yes [provider]  Insulin Degludec (TRESIBA FLEXTOUCH) 200 UNIT/ML SOPN Inject 14 Units into the skin every evening.    Yes [provider]  JANUVIA 100 MG tablet Take 100 mg by mouth daily.  08/09/15  Yes [provider]  montelukast (SINGULAIR) 10 MG tablet Take 10 mg by mouth at bedtime.   Yes [provider]  Multiple Vitamin (MULTIVITAMIN) tablet Take 1 tablet by mouth daily.   Yes [provider]  oxyCODONE (ROXICODONE) 15 MG immediate release tablet Take 15 mg by mouth 4 (  four) times daily as needed for pain. For pain.   Yes [provider]  predniSONE (DELTASONE) 2.5 MG tablet Take 2.5 mg by mouth daily with breakfast.   Yes [provider]  Probiotic Product (PROBIOTIC DAILY PO) Take 1 tablet by mouth daily.   Yes [provider]  promethazine (PHENERGAN) 12.5 MG tablet Take 12.5 mg by mouth daily as needed for nausea or vomiting.  08/17/17  Yes [provider]  ranitidine (ZANTAC) 300 MG tablet Take 300 mg by mouth 2 (two) times daily. 08/15/15  Yes [provider]  senna-docusate (PERI-COLACE) 8.6-50 MG tablet Take 1 tablet by mouth daily as needed for mild constipation.   Yes [provider]  VENTOLIN HFA 108 (90 Base) MCG/ACT inhaler Inhale 1 puff into the lungs every 6 (six) hours as needed for shortness of breath. wheezing 06/13/15  Yes [provider]  Vitamin D, Ergocalciferol, (DRISDOL) 50000 units CAPS capsule Take 1 capsule by mouth once a week. On Friday 09/15/17  Yes [provider]  zolpidem (AMBIEN) 10 MG tablet Take 10 mg by mouth at bedtime.    Yes [provider]    Physical Exam: Vitals:   09/20/17 1020 09/20/17 1023 09/20/17 1030 09/20/17 1100  BP: 129/72 132/81 132/81   Pulse: 78 78 79   Resp: 16 16 17    Temp: 98 F (36.7 C) 98 F (36.7 C)  98.3 F  (36.8 C)  TempSrc: Oral Oral  Oral  SpO2:   99%   Weight:      Height:        Constitutional: NAD, calm, comfortable Eyes: PERRL, lids and conjunctivae normal ENMT: Mucous membranes are moist. Posterior pharynx clear of any exudate or lesions. Neck: normal, supple, no masses, no thyromegaly Respiratory: Clear to auscultation bilaterally, no wheezing, no crackles. Normal respiratory effort. No accessory muscle use.  Cardiovascular: Regular rate and rhythm, no murmurs / rubs / gallops. No extremity edema. 2+ pedal pulses. No carotid bruits.  Abdomen: Obese, soft, positive LLQ tenderness, no guarding/rebound/masses palpated. No hepatosplenomegaly. Bowel sounds positive.  Musculoskeletal: no clubbing / cyanosis. Good ROM, no contractures. Normal muscle tone.  Skin: no rashes, lesions, ulcers on limited dermatological examination. Neurologic: CN 2-12 grossly intact. Sensation intact, DTR normal. Strength 5/5 in all 4.  Psychiatric: Normal judgment and insight. Alert and oriented x 4. Normal mood.    Labs on Admission: I have personally reviewed following labs and imaging studies  CBC: Recent Labs  Lab 09/20/17 0818 09/20/17 0858  WBC 8.8  --   HGB 6.3* 6.5*  HCT 19.3* 19.0*  MCV 90.2  --   PLT 292  --    Basic Metabolic Panel: Recent Labs  Lab 09/20/17 0818 09/20/17 0858  NA 141 141  K 3.6 3.6  CL 112* 112*  CO2 22  --   GLUCOSE 118* 110*  BUN 57* 57*  CREATININE 6.06* 6.70*  CALCIUM 9.0  --    GFR: Estimated Creatinine Clearance: 9.1 mL/min (A) (by C-G formula based on SCr of 6.7 mg/dL (H)). Liver Function Tests: Recent Labs  Lab 09/20/17 0818  AST 14*  ALT 8  ALKPHOS 68  BILITOT 0.4  PROT 6.5  ALBUMIN 2.8*   No results for input(s): LIPASE, AMYLASE in the last 168 hours. No results for input(s): AMMONIA in the last 168 hours. Coagulation Profile: No results for input(s): INR, PROTIME in the last 168 hours. Cardiac Enzymes: No results for input(s):  CKTOTAL, CKMB, CKMBINDEX, TROPONINI  in the last 168 hours. BNP (last 3 results) No results for input(s): PROBNP in the last 8760 hours. HbA1C: No results for input(s): HGBA1C in the last 72 hours. CBG: No results for input(s): GLUCAP in the last 168 hours. Lipid Profile: No results for input(s): CHOL, HDL, LDLCALC, TRIG, CHOLHDL, LDLDIRECT in the last 72 hours. Thyroid Function Tests: No results for input(s): TSH, T4TOTAL, FREET4, T3FREE, THYROIDAB in the last 72 hours. Anemia Panel: No results for input(s): VITAMINB12, FOLATE, FERRITIN, TIBC, IRON, RETICCTPCT in the last 72 hours. Urine analysis:    Component Value Date/Time   COLORURINE YELLOW 08/17/2015 1621   APPEARANCEUR CLEAR 08/17/2015 1621   LABSPEC 1.015 08/17/2015 1621   PHURINE 5.5 08/17/2015 1621   GLUCOSEU NEGATIVE 08/17/2015 1621   HGBUR NEGATIVE 08/17/2015 1621   BILIRUBINUR NEGATIVE 08/17/2015 1621   KETONESUR NEGATIVE 08/17/2015 1621   PROTEINUR 100 (A) 08/17/2015 1621   UROBILINOGEN 0.2 09/05/2014 0013   NITRITE NEGATIVE 08/17/2015 1621   LEUKOCYTESUR NEGATIVE 08/17/2015 1621    Radiological Exams on Admission: Dg Chest Port 1 View  Result Date: 09/20/2017 CLINICAL DATA:  Rectal bleeding.  Chronic kidney disease. EXAM: PORTABLE CHEST 1 VIEW COMPARISON:  05/31/2016 FINDINGS: The heart size and mediastinal contours are within normal limits. Both lungs are clear. The visualized skeletal structures are unremarkable. IMPRESSION: No active disease. Electronically Signed   By: Kathreen Devoid   On: 09/20/2017 09:19    EKG: Independently reviewed.   Assessment/Plan Principal Problem:   Hematochezia Admit to stepdown/inpatient. Continue supplemental oxygen. Keep n.p.o. Continue PRBC transfusion. Monitor hematocrit and hemoglobin. Famotidine 20 mg IVP every 12 hours. Antiemetics as needed. GI consult appreciated.  Active Problems:   Symptomatic anemia As above. Continue PRBC transfusion.   Monitor H&H.     Type 2 diabetes mellitus (HCC) Last hemoglobin A1c 6.9% on 06/19/2017. Currently n.p.o. CBG monitoring every 6 hours with sensitive RI coverage. Switch to before meals and at bedtime CBG monitoring once cleared for oral intake.    Essential hypertension Continue clonidine to avoid rebound hypertension or anxiety. Multiple other antihypertensive meds. Monitor blood pressure heart rate, renal function electrolytes.    GERD (gastroesophageal reflux disease) Famotidine 20 mg IVP every 12 hours while n.p.o.    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 2 diabetes mellitus ([place]) Advised to resume follow-ups with nephrology. Advised to consult with vascular surgery for HD vascular access.    COPD (chronic obstructive pulmonary disease) ([place]) Supplemental oxygen as needed. Bronchodilators as needed.    Tobacco use Nicotine replacement therapy offered. Nicotine replacement therapy ordered PRN. Nurse to provide tobacco cessation information.    Headache Avoid NSAIDs or aspirin. Tylenol as needed.    Psoriatic arthritis On Enbrel injections.    BACK PAIN, CHRONIC, LUMBAR Continue oxycodone as needed.    DVT prophylaxis: SCDs. Code Status: Full code. Family Communication: Her daughter Bartolo Darter was present in the ED room. Disposition Plan: Admit to stepdown for PRBC transfusion and close monitoring. Consults called: GI Wilford Corner, MD) Admission status: Inpatient/stepdown.   Reubin Milan MD [facility] Pager [phone].  If 7PM-7AM, please contact night-coverage [email or web address] Password [facility]  09/20/2017, 11:41 AM

## 2017-09-20 NOTE — ED Notes (Signed)
ED TO INPATIENT HANDOFF REPORT  Name/Age/Gender Jodi Bell 57 y.o. female  Code Status Code Status History    Date Active Date Inactive Code Status Order ID Comments User Context   09/28/2013 0745 10/03/2013 1820 Full Code [id number]  Elmarie Shiley, MD Inpatient      Home/SNF/Other Home  Chief Complaint rectal bleeding  Level of Care/Admitting Diagnosis ED Disposition    ED Disposition Condition Comment   Admit  Hospital Area: [facility] [[id number]]  Level of Care: Stepdown [14]  Admit to SDU based on following criteria: Hemodynamic compromise or significant risk of instability:  Patient requiring short term acute titration and management of vasoactive drips, and invasive monitoring (i.e., CVP and Arterial line).  Diagnosis: Hematochezia [[id number]]  Admitting Physician: Reubin Milan [[id number]]  Attending Physician: Reubin Milan [[id number]]  Estimated length of stay: past midnight tomorrow  Certification:: I certify this patient will need inpatient services for at least 2 midnights  PT Class (Do Not Modify): Inpatient [101]  PT Acc Code (Do Not Modify): Private [1]       Medical History Past Medical History:  Diagnosis Date  . Anemia   . Arthritis    oa and psoriatic ra  . Asthma   . Back pain, chronic    lower back  . Candida infection finished tx 2 days ago  . Chronic insomnia   . Chronic kidney disease    stage 3 ckd last ov nephrology 04-29-17 epic  . Chronic kidney disease (CKD) stage G4/A1, severely decreased glomerular filtration rate (GFR) between 15-29 mL/min/1.73 square meter and albuminuria creatinine ratio less than 30 mg/g (HCC)   . COPD (chronic obstructive pulmonary disease) ([place])   . Diabetes mellitus    type 2  . Eczema   . Essential hypertension   . GERD (gastroesophageal reflux disease)   . History of hiatal hernia   . Obesity   . Pneumonia few yrs ago x 2    Allergies Allergies  Allergen Reactions  .  Adalimumab Other (See Comments)    Blisters on abdomen =humira  . Ibuprofen Other (See Comments)    Renal Problems  . Leflunomide And Related Other (See Comments)    Severe Headache  . Lexapro [Escitalopram Oxalate] Other (See Comments)    Hand problems - Serotonin Syndrome   . Lisinopril Swelling    Angioedema   . Other     Cosentyx= angioedema   . Pistachio Nut (Diagnostic)     Mouth itches and tongue swelling Hazel nuts and pecans also  . Statins Other (See Comments)    Leg pains and weakness  . Trazodone And Nefazodone Other (See Comments)    Serotonin Syndrome     IV Location/Drains/Wounds Patient Lines/Drains/Airways Status   Active Line/Drains/Airways    Name:   Placement date:   Placement time:   Site:   Days:   Peripheral IV 09/20/17 Right;Upper Arm   09/20/17    0855    Arm   less than 1          Labs/Imaging Results for orders placed or performed during the hospital encounter of 09/20/17 (from the past 48 hour(s))  Comprehensive metabolic panel     Status: Abnormal   Collection Time: 09/20/17  8:18 AM  Result Value Ref Range   Sodium 141 135 - 145 mmol/L   Potassium 3.6 3.5 - 5.1 mmol/L   Chloride 112 (H) 98 - 111 mmol/L   CO2 22 22 -  32 mmol/L   Glucose, Bld 118 (H) 70 - 99 mg/dL   BUN 57 (H) 6 - 20 mg/dL   Creatinine, Ser 6.06 (H) 0.44 - 1.00 mg/dL   Calcium 9.0 8.9 - 10.3 mg/dL   Total Protein 6.5 6.5 - 8.1 g/dL   Albumin 2.8 (L) 3.5 - 5.0 g/dL   AST 14 (L) 15 - 41 U/L   ALT 8 0 - 44 U/L   Alkaline Phosphatase 68 38 - 126 U/L   Total Bilirubin 0.4 0.3 - 1.2 mg/dL   GFR calc non Af Amer 7 (L) >60 mL/min   GFR calc Af Amer 8 (L) >60 mL/min    Comment: (NOTE) The eGFR has been calculated using the CKD EPI equation. This calculation has not been validated in all clinical situations. eGFR's persistently <60 mL/min signify possible Chronic Kidney Disease.    Anion gap 7 5 - 15    Comment: Performed at [facility], [place] [address], [place], [place] [address]  CBC     Status: Abnormal   Collection Time: 09/20/17  8:18 AM  Result Value Ref Range   WBC 8.8 4.0 - 10.5 K/uL   RBC 2.14 (L) 3.87 - 5.11 MIL/uL   Hemoglobin 6.3 (LL) 12.0 - 15.0 g/dL    Comment: RESULT REPEATED AND VERIFIED CRITICAL RESULT CALLED TO, READ BACK BY AND VERIFIED WITH: DOWD,P AT 0930 ON [id number] BY HOOKER,B    HCT 19.3 (L) 36.0 - 46.0 %   MCV 90.2 78.0 - 100.0 fL   MCH 29.4 26.0 - 34.0 pg   MCHC 32.6 30.0 - 36.0 g/dL   RDW 15.9 (H) 11.5 - 15.5 %   Platelets 292 150 - 400 K/uL    Comment: Performed at [facility], [place] [address], [place], [place] [address]  Type and screen [place]     Status: None (Preliminary result)   Collection Time: 09/20/17  8:18 AM  Result Value Ref Range   ABO/RH(D) AB POS    Antibody Screen NEG    Sample Expiration 09/23/2017    Unit Number [id number]    Blood Component Type RED CELLS,LR    Unit division 00    Status of Unit ALLOCATED    Transfusion Status OK TO TRANSFUSE    Crossmatch Result Compatible    Unit Number [id number]    Blood Component Type RBC LR PHER2    Unit division 00    Status of Unit ALLOCATED    Transfusion Status OK TO TRANSFUSE    Crossmatch Result Compatible    Unit Number [id number]    Blood Component Type RED CELLS,LR    Unit division 00    Status of Unit ISSUED    Transfusion Status OK TO TRANSFUSE    Crossmatch Result      Compatible Performed at [facility], [place] [address] [place], [place] [address]    Unit Number [id number]    Blood Component Type RED CELLS,LR    Unit division 00    Status of Unit ALLOCATED    Transfusion Status OK TO TRANSFUSE    Crossmatch Result Compatible   Prepare RBC     Status: None   Collection Time: 09/20/17  8:41 AM  Result Value Ref Range   Order Confirmation      ORDER PROCESSED BY BLOOD BANK Performed at [facility], [place] [address]  [place],  [address]   I-stat Chem 8, ED  Status: Abnormal   Collection Time: 09/20/17  8:58 AM  Result Value Ref Range   Sodium 141 135 - 145 mmol/L   Potassium 3.6 3.5 - 5.1 mmol/L   Chloride 112 (H) 98 - 111 mmol/L   BUN 57 (H) 6 - 20 mg/dL   Creatinine, Ser 6.70 (H) 0.44 - 1.00 mg/dL   Glucose, Bld 110 (H) 70 - 99 mg/dL   Calcium, Ion 1.28 1.15 - 1.40 mmol/L   TCO2 21 (L) 22 - 32 mmol/L   Hemoglobin 6.5 (LL) 12.0 - 15.0 g/dL   HCT 19.0 (L) 36.0 - 46.0 %   Comment NOTIFIED PHYSICIAN   Prepare RBC     Status: None   Collection Time: 09/20/17  9:02 AM  Result Value Ref Range   Order Confirmation      ORDER PROCESSED BY BLOOD BANK Performed at [facility], [place] [address] [place], [place] [address]    Dg Chest Port 1 View  Result Date: 09/20/2017 CLINICAL DATA:  Rectal bleeding.  Chronic kidney disease. EXAM: PORTABLE CHEST 1 VIEW COMPARISON:  05/31/2016 FINDINGS: The heart size and mediastinal contours are within normal limits. Both lungs are clear. The visualized skeletal structures are unremarkable. IMPRESSION: No active disease. Electronically Signed   By: Kathreen Devoid   On: 09/20/2017 09:19    Pending Labs Unresulted Labs (From admission, onward)    Start     Ordered   09/20/17 0819  Pregnancy, urine  Once,   STAT     09/20/17 0818          Vitals/Pain Today's Vitals   09/20/17 1000 09/20/17 1011 09/20/17 1020 09/20/17 1023  BP: 129/72  129/72 132/81  Pulse: 77 78 78 78  Resp:   16 16  Temp:   98 F (36.7 C) 98 F (36.7 C)  TempSrc:   Oral Oral  SpO2: 98% 99%    Weight:      Height:      PainSc:        Isolation Precautions No active isolations  Medications Medications  0.9 %  sodium chloride infusion (Manually program via Guardrails IV Fluids) (has no administration in time range)  famotidine (PEPCID) IVPB 20 mg premix (has no administration in time range)  0.9 %  sodium chloride infusion (10 mL/hr Intravenous New Bag/Given  09/20/17 0925)    Mobility walks with device

## 2017-09-21 DIAGNOSIS — Z720 Tobacco use: Secondary | ICD-10-CM

## 2017-09-21 DIAGNOSIS — I10 Essential (primary) hypertension: Secondary | ICD-10-CM

## 2017-09-21 DIAGNOSIS — N185 Chronic kidney disease, stage 5: Secondary | ICD-10-CM

## 2017-09-21 DIAGNOSIS — E1122 Type 2 diabetes mellitus with diabetic chronic kidney disease: Secondary | ICD-10-CM

## 2017-09-21 DIAGNOSIS — L4050 Arthropathic psoriasis, unspecified: Secondary | ICD-10-CM

## 2017-09-21 DIAGNOSIS — K219 Gastro-esophageal reflux disease without esophagitis: Secondary | ICD-10-CM

## 2017-09-21 DIAGNOSIS — D649 Anemia, unspecified: Secondary | ICD-10-CM

## 2017-09-21 LAB — CBC WITH DIFFERENTIAL/PLATELET
Basophils Absolute: 0 10*3/uL (ref 0.0–0.1)
Basophils Relative: 0 %
Eosinophils Absolute: 0.2 10*3/uL (ref 0.0–0.7)
Eosinophils Relative: 3 %
HCT: 21.8 % — ABNORMAL LOW (ref 36.0–46.0)
Hemoglobin: 7.2 g/dL — ABNORMAL LOW (ref 12.0–15.0)
Lymphocytes Relative: 24 %
Lymphs Abs: 1.9 10*3/uL (ref 0.7–4.0)
MCH: 29.4 pg (ref 26.0–34.0)
MCHC: 33 g/dL (ref 30.0–36.0)
MCV: 89 fL (ref 78.0–100.0)
Monocytes Absolute: 0.7 10*3/uL (ref 0.1–1.0)
Monocytes Relative: 9 %
Neutro Abs: 5 10*3/uL (ref 1.7–7.7)
Neutrophils Relative %: 64 %
Platelets: 205 10*3/uL (ref 150–400)
RBC: 2.45 MIL/uL — ABNORMAL LOW (ref 3.87–5.11)
RDW: 15.9 % — ABNORMAL HIGH (ref 11.5–15.5)
WBC: 7.8 10*3/uL (ref 4.0–10.5)

## 2017-09-21 LAB — COMPREHENSIVE METABOLIC PANEL
ALT: 9 U/L (ref 0–44)
AST: 10 U/L — ABNORMAL LOW (ref 15–41)
Albumin: 2.3 g/dL — ABNORMAL LOW (ref 3.5–5.0)
Alkaline Phosphatase: 50 U/L (ref 38–126)
Anion gap: 6 (ref 5–15)
BUN: 53 mg/dL — ABNORMAL HIGH (ref 6–20)
CO2: 22 mmol/L (ref 22–32)
Calcium: 8.9 mg/dL (ref 8.9–10.3)
Chloride: 115 mmol/L — ABNORMAL HIGH (ref 98–111)
Creatinine, Ser: 5.14 mg/dL — ABNORMAL HIGH (ref 0.44–1.00)
GFR calc Af Amer: 10 mL/min — ABNORMAL LOW (ref >60)
GFR calc non Af Amer: 8 mL/min — ABNORMAL LOW (ref >60)
Glucose, Bld: 93 mg/dL (ref 70–99)
Potassium: 3.6 mmol/L (ref 3.5–5.1)
Sodium: 143 mmol/L (ref 135–145)
Total Bilirubin: 0.4 mg/dL (ref 0.3–1.2)
Total Protein: 5.2 g/dL — ABNORMAL LOW (ref 6.5–8.1)

## 2017-09-21 LAB — GLUCOSE, CAPILLARY
Glucose-Capillary: 108 mg/dL — ABNORMAL HIGH (ref 70–99)
Glucose-Capillary: 112 mg/dL — ABNORMAL HIGH (ref 70–99)
Glucose-Capillary: 146 mg/dL — ABNORMAL HIGH (ref 70–99)
Glucose-Capillary: 164 mg/dL — ABNORMAL HIGH (ref 70–99)
Glucose-Capillary: 171 mg/dL — ABNORMAL HIGH (ref 70–99)
Glucose-Capillary: 76 mg/dL (ref 70–99)
Glucose-Capillary: 78 mg/dL (ref 70–99)
Glucose-Capillary: 82 mg/dL (ref 70–99)
Glucose-Capillary: 84 mg/dL (ref 70–99)

## 2017-09-21 LAB — HEMOGLOBIN AND HEMATOCRIT, BLOOD
HCT: 23.1 % — ABNORMAL LOW (ref 36.0–46.0)
HCT: 22.6 % — ABNORMAL LOW (ref 36.0–46.0)
HCT: 19.4 % — ABNORMAL LOW (ref 36.0–46.0)
Hemoglobin: 7.7 g/dL — ABNORMAL LOW (ref 12.0–15.0)
Hemoglobin: 7.6 g/dL — ABNORMAL LOW (ref 12.0–15.0)
Hemoglobin: 6.5 g/dL — CL (ref 12.0–15.0)

## 2017-09-21 LAB — HIV ANTIBODY (ROUTINE TESTING W REFLEX): HIV Screen 4th Generation wRfx: NONREACTIVE

## 2017-09-21 MED ORDER — SODIUM CHLORIDE 0.9% IV SOLUTION
Freq: Once | INTRAVENOUS | Status: AC
  Administered 2017-09-21: via INTRAVENOUS

## 2017-09-21 MED ORDER — AMLODIPINE BESYLATE 10 MG PO TABS
10.0000 mg | ORAL_TABLET | Freq: Every day | ORAL | Status: DC
  Administered 2017-09-21 – 2017-09-28 (×8): 10 mg via ORAL
  Filled 2017-09-21 (×8): qty 1

## 2017-09-21 MED ORDER — FAMOTIDINE IN NACL 20-0.9 MG/50ML-% IV SOLN
20.0000 mg | INTRAVENOUS | Status: DC
  Administered 2017-09-21 – 2017-09-26 (×6): 20 mg via INTRAVENOUS
  Filled 2017-09-21 (×7): qty 50

## 2017-09-21 MED ORDER — INSULIN ASPART 100 UNIT/ML ~~LOC~~ SOLN
0.0000 [IU] | SUBCUTANEOUS | Status: DC
  Administered 2017-09-21: 1 [IU] via SUBCUTANEOUS
  Administered 2017-09-21 – 2017-09-22 (×2): 2 [IU] via SUBCUTANEOUS
  Administered 2017-09-22 – 2017-09-25 (×7): 1 [IU] via SUBCUTANEOUS

## 2017-09-21 MED ORDER — DEXTROSE-NACL 5-0.2 % IV SOLN
INTRAVENOUS | Status: DC
  Administered 2017-09-21: 1000 mL via INTRAVENOUS

## 2017-09-21 MED ORDER — ALPRAZOLAM 0.25 MG PO TABS
0.2500 mg | ORAL_TABLET | Freq: Once | ORAL | Status: AC
  Administered 2017-09-21: 0.25 mg via ORAL
  Filled 2017-09-21: qty 1

## 2017-09-21 MED ORDER — LORATADINE 10 MG PO TABS
10.0000 mg | ORAL_TABLET | Freq: Every day | ORAL | Status: DC
  Administered 2017-09-21 – 2017-09-28 (×7): 10 mg via ORAL
  Filled 2017-09-21 (×9): qty 1

## 2017-09-21 MED ORDER — DEXTROSE-NACL 5-0.45 % IV SOLN: INTRAVENOUS | Status: DC

## 2017-09-21 MED ORDER — FLUTICASONE PROPIONATE 50 MCG/ACT NA SUSP
1.0000 | Freq: Every day | NASAL | Status: DC
  Administered 2017-09-21 – 2017-09-28 (×8): 1 via NASAL
  Filled 2017-09-21: qty 16

## 2017-09-21 MED ORDER — MONTELUKAST SODIUM 10 MG PO TABS
10.0000 mg | ORAL_TABLET | Freq: Every day | ORAL | Status: DC
  Administered 2017-09-21 – 2017-09-27 (×7): 10 mg via ORAL
  Filled 2017-09-21 (×7): qty 1

## 2017-09-21 NOTE — Progress Notes (Signed)
CRITICAL VALUE ALERT  Critical Value:  Hgb 6.5  Date & Time Notied:  09/21/2017 2220  Provider Notified: [facility]   Orders Received/Actions taken: Waiting for orders; will continue to monitor

## 2017-09-21 NOTE — Progress Notes (Signed)
PROGRESS NOTE  Jodi Bell GLO:[id number] DOB: [birth year] DOA: 09/20/2017 PCP: [facility]  HPI/Recap of past 19 hours: 57 year old female with medical history significant for ESRD, psoriatic arthritis, asthma/COPD, chronic back pain, DM type II, hypertension, history of Candida esophagitis, presents to the ER complaining of about 3 episodes of BRBPR, with an additional 3 episodes while in the ED X 1 day.  Complained of associated left lower quadrant discomfort, with some fatigue.  Patient denies any regular NSAID use, or any prior episodes.  Patient does report frequent dark stools but is on iron supplementation.  In the ED, patient was noted to have a hemoglobin of 6.3, baseline around 8.5, creatinine 6.06, baseline around 5.  GI consulted.  Patient admitted for further management.  Today, patient reports feeling better, last BRBPR was last night, none so far this am.  Denies any abdominal pain, nausea/vomiting, fever/chills, chest pain, shortness of breath.   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 GI on board-continue to monitor, may need a colonoscopy before discharge.  Advance to CLD Trend CBC Monitor closely  Acute drop in hgb/anemia of CKD Hgb on admission 6.3, baseline around 8.5 Likely worsened by above S/P 2 PRBC Trend CBC  ESRD Not on HD yet, still makes urine Cr around baseline Follows with nephrology: Pt has been constantly  advised to start HD and see vascular for access  Type 2 DM Controlled Last A1c 6.9% on 06/19/17 SSI, accuchecks, hypoglycemic protocol Hold home regimen for now  HTN Stable Continue norvasc, clonidine Hold hydralazine given GI bleed Monitor closely  COPD/asthma Stable Nebulizer as needed Supplemental oxygen as needed Continue Flonase, singular  GERD Continue PPI  Psoriatic arthritis Continue Enbrel injections  Chronic back pain Continue home oxycodone as needed  Tobacco abuse Advised to quit Nicotine patch offered     Code Status: Full  Family Communication: None at bedside  Disposition Plan: Home once stable   Consultants:  GI  Procedures:  None  Antimicrobials:  None  DVT prophylaxis: SCDs   Objective: Vitals:   09/21/17 0929 09/21/17 0930 09/21/17 1000 09/21/17 1100  BP: (!) 179/88 (!) 179/88 (!) 141/80   Pulse:   77 73  Resp:   17 15  Temp:      TempSrc:      SpO2:   98% 98%  Weight:      Height:        Intake/Output Summary (Last 24 hours) at 09/21/2017 1156 Last data filed at 09/21/2017 1100 Gross per 24 hour  Intake 1131.59 ml  Output 1250 ml  Net -118.41 ml   Filed Weights   09/20/17 0805  Weight: 80.7 kg    Exam:   General: NAD  Cardiovascular: S1, S2 present  Respiratory: CTA B  Abdomen: Soft, nontender, nondistended, bowel sounds present  Musculoskeletal: No pedal edema bilaterally  Skin: Normal  Psychiatry: Normal mood   Data Reviewed: CBC: Recent Labs  Lab 09/20/17  0818 09/20/17 0858 09/20/17 1843 09/21/17 0049 09/21/17 0333 09/21/17 0849  WBC 8.8  --   --   --  7.8  --   NEUTROABS  --   --   --   --  5.0  --   HGB 6.3* 6.5* 8.2* 7.7* 7.2* 7.6*  HCT 19.3* 19.0* 24.4* 23.1* 21.8* 22.6*  MCV 90.2  --   --   --  89.0  --   PLT 292  --   --   --  205  --    Basic Metabolic Panel: Recent Labs  Lab 09/20/17 0818 09/20/17 0858 09/21/17 0333  NA 141 141 143  K 3.6 3.6 3.6  CL 112* 112*  115*  CO2 22  --  22  GLUCOSE 118* 110* 93  BUN 57* 57* 53*  CREATININE 6.06* 6.70* 5.14*  CALCIUM 9.0  --  8.9   GFR: Estimated Creatinine Clearance: 11.9 mL/min (A) (by C-G formula based on SCr of 5.14 mg/dL (H)). Liver Function Tests: Recent Labs  Lab 09/20/17 0818 09/21/17 0333  AST 14* 10*  ALT 8 9  ALKPHOS 68 50  BILITOT 0.4 0.4  PROT 6.5 5.2*  ALBUMIN 2.8* 2.3*   No results for input(s): LIPASE, AMYLASE in the last 168 hours. No results for input(s): AMMONIA in the last 168 hours. Coagulation Profile: No results for input(s): INR, PROTIME in the last 168 hours. Cardiac Enzymes: No results for input(s): CKTOTAL, CKMB, CKMBINDEX, TROPONINI in the last 168 hours. BNP (last 3 results) No results for input(s): PROBNP in the last 8760 hours. HbA1C: No results for input(s): HGBA1C in the last 72 hours. CBG: Recent Labs  Lab 09/21/17 0101 09/21/17 0322 09/21/17 0648 09/21/17 0830 09/21/17 1135  GLUCAP 76 82 84 78 112*   Lipid Profile: No results for input(s): CHOL, HDL, LDLCALC, TRIG, CHOLHDL, LDLDIRECT in the last 72 hours. Thyroid Function Tests: No results for input(s): TSH, T4TOTAL, FREET4, T3FREE, THYROIDAB in the last 72 hours. Anemia Panel: No results for input(s): VITAMINB12, FOLATE, FERRITIN, TIBC, IRON, RETICCTPCT in the last 72 hours. Urine analysis:    Component Value Date/Time   COLORURINE YELLOW 08/17/2015 1621   APPEARANCEUR CLEAR 08/17/2015 1621   LABSPEC 1.015 08/17/2015 1621   PHURINE 5.5 08/17/2015 1621   GLUCOSEU NEGATIVE 08/17/2015 1621   HGBUR NEGATIVE 08/17/2015 1621   BILIRUBINUR NEGATIVE 08/17/2015 1621   KETONESUR NEGATIVE 08/17/2015 1621   PROTEINUR 100 (A) 08/17/2015 1621   UROBILINOGEN 0.2 09/05/2014 0013   NITRITE NEGATIVE 08/17/2015 1621   LEUKOCYTESUR NEGATIVE 08/17/2015 1621   Sepsis Labs: @LABRCNTIP (procalcitonin:4,lacticidven:4)  ) Recent Results (from the past 240 hour(s))  MRSA PCR Screening     Status: None    Collection Time: 09/20/17 11:17 AM  Result Value Ref Range Status   MRSA by PCR NEGATIVE NEGATIVE Final    Comment:        The GeneXpert MRSA Assay (FDA approved for NASAL specimens only), is one component of a comprehensive MRSA colonization surveillance program. It is not intended to diagnose MRSA infection nor to guide or monitor treatment for MRSA infections. Performed at [facility], [place] [address] [place], [place] [address]       Studies: No results found.  Scheduled Meds: . amLODipine  10 mg Oral Daily  . cloNIDine  0.2 mg Oral BID  . furosemide  40 mg Oral Daily  . insulin aspart  0-9 Units Subcutaneous Q4H  . predniSONE  2.5 mg Oral Q breakfast  .  sodium chloride flush  3 mL Intravenous Q12H  . zolpidem  5 mg Oral QHS    Continuous Infusions: . sodium chloride Stopped (09/21/17 0832)  . dextrose 5 % and 0.2 % NaCl 1,000 mL (09/21/17 0836)  . famotidine (PEPCID) IV       LOS: 1 day     Alma Friendly, MD [facility]   If 7PM-7AM, please contact night-coverage [email or web address] Password [facility] 09/21/2017, 11:56 AM

## 2017-09-21 NOTE — Progress Notes (Signed)
[facility] Progress Note  Subjective: The patient experienced some rectal bleeding last night which was reported as bright red but none today.  She feels okay today.  Objective: Vital signs in last 24 hours: Temp:  [98 F (36.7 C)-98.3 F (36.8 C)] 98.2 F (36.8 C) (08/12 0800) Pulse Rate:  [72-82] 74 (08/12 0900) Resp:  [11-21] 15 (08/12 0900) BP: (129-179)/(69-88) 179/88 (08/12 0930) SpO2:  [97 %-99 %] 98 % (08/12 0900) Weight change:    PE:  No distress  Heart regular rhythm  Lungs clear  Abdomen soft with some minimal discomfort on the right side which she says feels like a pinching.  Lab Results: Results for orders placed or performed during the hospital encounter of 09/20/17 (from the past 24 hour(s))  MRSA PCR Screening     Status: None   Collection Time: 09/20/17 11:17 AM  Result Value Ref Range   MRSA by PCR NEGATIVE NEGATIVE  Glucose, capillary     Status: Abnormal   Collection Time: 09/20/17 12:04 PM  Result Value Ref Range   Glucose-Capillary 106 (H) 70 - 99 mg/dL  Glucose, capillary     Status: None   Collection Time: 09/20/17  4:10 PM  Result Value Ref Range   Glucose-Capillary 98 70 - 99 mg/dL  Hemoglobin and hematocrit, blood     Status: Abnormal   Collection Time: 09/20/17  6:43 PM  Result Value Ref Range   Hemoglobin 8.2 (L) 12.0 - 15.0 g/dL   HCT 24.4 (L) 36.0 - 46.0 %  Hemoglobin and hematocrit, blood     Status: Abnormal   Collection Time: 09/21/17 12:49 AM  Result Value Ref Range   Hemoglobin 7.7 (L) 12.0 - 15.0 g/dL   HCT 23.1 (L) 36.0 - 46.0 %  Glucose, capillary     Status: None   Collection Time: 09/21/17  1:01 AM  Result Value Ref Range   Glucose-Capillary 76 70 - 99 mg/dL  Glucose, capillary     Status: None   Collection Time: 09/21/17  3:22 AM  Result Value Ref Range   Glucose-Capillary 82 70 - 99 mg/dL  CBC WITH DIFFERENTIAL     Status: Abnormal   Collection Time: 09/21/17  3:33 AM  Result Value Ref Range   WBC  7.8 4.0 - 10.5 K/uL   RBC 2.45 (L) 3.87 - 5.11 MIL/uL   Hemoglobin 7.2 (L) 12.0 - 15.0 g/dL   HCT 21.8 (L) 36.0 - 46.0 %   MCV 89.0 78.0 - 100.0 fL   MCH 29.4 26.0 - 34.0 pg   MCHC 33.0 30.0 - 36.0 g/dL   RDW 15.9 (H) 11.5 - 15.5 %   Platelets 205 150 - 400 K/uL   Neutrophils Relative % 64 %   Neutro Abs 5.0 1.7 - 7.7 K/uL   Lymphocytes Relative 24 %   Lymphs Abs 1.9 0.7 - 4.0 K/uL   Monocytes Relative 9 %   Monocytes Absolute 0.7 0.1 - 1.0 K/uL   Eosinophils Relative 3 %   Eosinophils Absolute 0.2 0.0 - 0.7 K/uL   Basophils Relative 0 %   Basophils Absolute 0.0 0.0 - 0.1 K/uL  Comprehensive metabolic panel     Status: Abnormal   Collection Time: 09/21/17  3:33 AM  Result Value Ref Range   Sodium 143 135 - 145 mmol/L   Potassium 3.6 3.5 - 5.1 mmol/L   Chloride 115 (H) 98 - 111 mmol/L   CO2 22 22 - 32 mmol/L   Glucose, Bld  93 70 - 99 mg/dL   BUN 53 (H) 6 - 20 mg/dL   Creatinine, Ser 5.14 (H) 0.44 - 1.00 mg/dL   Calcium 8.9 8.9 - 10.3 mg/dL   Total Protein 5.2 (L) 6.5 - 8.1 g/dL   Albumin 2.3 (L) 3.5 - 5.0 g/dL   AST 10 (L) 15 - 41 U/L   ALT 9 0 - 44 U/L   Alkaline Phosphatase 50 38 - 126 U/L   Total Bilirubin 0.4 0.3 - 1.2 mg/dL   GFR calc non Af Amer 8 (L) >60 mL/min   GFR calc Af Amer 10 (L) >60 mL/min   Anion gap 6 5 - 15  Glucose, capillary     Status: None   Collection Time: 09/21/17  6:48 AM  Result Value Ref Range   Glucose-Capillary 84 70 - 99 mg/dL  Glucose, capillary     Status: None   Collection Time: 09/21/17  8:30 AM  Result Value Ref Range   Glucose-Capillary 78 70 - 99 mg/dL  Hemoglobin and hematocrit, blood     Status: Abnormal   Collection Time: 09/21/17  8:49 AM  Result Value Ref Range   Hemoglobin 7.6 (L) 12.0 - 15.0 g/dL   HCT 22.6 (L) 36.0 - 46.0 %    Studies/Results: No results found.    Assessment: Lower GI bleed.  Plan:   Etiology of this bleed is unclear.  She reports a normal colonoscopy in 2014.  We will continue to monitor  her.  She will probably need an updated colonoscopy before discharge.    Cassell Clement 09/21/2017, 9:41 AM  Pager: [phone] If no answer or after 5 PM call [phone] Lab Results  Component Value Date   HGB 7.6 (L) 09/21/2017   HGB 7.2 (L) 09/21/2017   HGB 7.7 (L) 09/21/2017   HCT 22.6 (L) 09/21/2017   HCT 21.8 (L) 09/21/2017   HCT 23.1 (L) 09/21/2017   ALKPHOS 50 09/21/2017   ALKPHOS 68 09/20/2017   ALKPHOS 63 08/17/2015   AST 10 (L) 09/21/2017   AST 14 (L) 09/20/2017   AST 21 08/17/2015   ALT 9 09/21/2017   ALT 8 09/20/2017   ALT 18 08/17/2015

## 2017-09-21 NOTE — Care Management Note (Signed)
Case Management Note  Patient Details  Name: JALAIYA OYSTER MRN: [id number] Date of Birth: April 06, 1960  Subjective/Objective:         Rectal bleeding /hgb 7.2 rec'd one unit prbc-hgb 7.6/iv flds at 50cc/hr,/iv pepcid/            Action/Plan:  Lives at home with adult children/has RN through [facility] will f/u to see what cm needs patient has at dc and progress of hospital stay.   Expected Discharge Date:                  Expected Discharge Plan:  Home/Self Care  In-House Referral:     Discharge planning Services  CM Consult  Post Acute Care Choice:    Choice offered to:     DME Arranged:    DME Agency:     HH Arranged:    HH Agency:     Status of Service:  In process, will continue to follow  If discussed at Long Length of Stay Meetings, dates discussed:    Additional Comments:  Leeroy Cha, RN 09/21/2017, 10:43 AM

## 2017-09-22 LAB — CBC
HCT: 26.9 % — ABNORMAL LOW (ref 36.0–46.0)
Hemoglobin: 9.2 g/dL — ABNORMAL LOW (ref 12.0–15.0)
MCH: 30 pg (ref 26.0–34.0)
MCHC: 34.2 g/dL (ref 30.0–36.0)
MCV: 87.6 fL (ref 78.0–100.0)
Platelets: 248 10*3/uL (ref 150–400)
RBC: 3.07 MIL/uL — ABNORMAL LOW (ref 3.87–5.11)
RDW: 16.1 % — ABNORMAL HIGH (ref 11.5–15.5)
WBC: 8.8 10*3/uL (ref 4.0–10.5)

## 2017-09-22 LAB — BASIC METABOLIC PANEL
Anion gap: 8 (ref 5–15)
BUN: 49 mg/dL — ABNORMAL HIGH (ref 6–20)
CO2: 20 mmol/L — ABNORMAL LOW (ref 22–32)
Calcium: 9.2 mg/dL (ref 8.9–10.3)
Chloride: 109 mmol/L (ref 98–111)
Creatinine, Ser: 4.69 mg/dL — ABNORMAL HIGH (ref 0.44–1.00)
GFR calc Af Amer: 11 mL/min — ABNORMAL LOW (ref >60)
GFR calc non Af Amer: 9 mL/min — ABNORMAL LOW (ref >60)
Glucose, Bld: 128 mg/dL — ABNORMAL HIGH (ref 70–99)
Potassium: 3.9 mmol/L (ref 3.5–5.1)
Sodium: 137 mmol/L (ref 135–145)

## 2017-09-22 LAB — CBC WITH DIFFERENTIAL/PLATELET
Basophils Absolute: 0 10*3/uL (ref 0.0–0.1)
Basophils Relative: 0 %
Eosinophils Absolute: 0.3 10*3/uL (ref 0.0–0.7)
Eosinophils Relative: 4 %
HCT: 25 % — ABNORMAL LOW (ref 36.0–46.0)
Hemoglobin: 8.4 g/dL — ABNORMAL LOW (ref 12.0–15.0)
Lymphocytes Relative: 26 %
Lymphs Abs: 1.9 10*3/uL (ref 0.7–4.0)
MCH: 29.2 pg (ref 26.0–34.0)
MCHC: 33.6 g/dL (ref 30.0–36.0)
MCV: 86.8 fL (ref 78.0–100.0)
Monocytes Absolute: 0.8 10*3/uL (ref 0.1–1.0)
Monocytes Relative: 10 %
Neutro Abs: 4.4 10*3/uL (ref 1.7–7.7)
Neutrophils Relative %: 60 %
Platelets: 214 10*3/uL (ref 150–400)
RBC: 2.88 MIL/uL — ABNORMAL LOW (ref 3.87–5.11)
RDW: 15.6 % — ABNORMAL HIGH (ref 11.5–15.5)
WBC: 7.4 10*3/uL (ref 4.0–10.5)

## 2017-09-22 LAB — GLUCOSE, CAPILLARY
Glucose-Capillary: 108 mg/dL — ABNORMAL HIGH (ref 70–99)
Glucose-Capillary: 108 mg/dL — ABNORMAL HIGH (ref 70–99)
Glucose-Capillary: 122 mg/dL — ABNORMAL HIGH (ref 70–99)
Glucose-Capillary: 123 mg/dL — ABNORMAL HIGH (ref 70–99)
Glucose-Capillary: 156 mg/dL — ABNORMAL HIGH (ref 70–99)
Glucose-Capillary: 89 mg/dL (ref 70–99)

## 2017-09-22 LAB — PREPARE RBC (CROSSMATCH)

## 2017-09-22 MED ORDER — SENNOSIDES-DOCUSATE SODIUM 8.6-50 MG PO TABS
1.0000 | ORAL_TABLET | Freq: Every evening | ORAL | Status: DC | PRN
Start: 2017-09-22 — End: 2017-09-28
  Administered 2017-09-26 – 2017-09-27 (×2): 1 via ORAL
  Filled 2017-09-22 (×2): qty 1

## 2017-09-22 MED ORDER — HYDRALAZINE HCL 20 MG/ML IJ SOLN
10.0000 mg | Freq: Once | INTRAMUSCULAR | Status: AC
  Administered 2017-09-22: 10 mg via INTRAVENOUS
  Filled 2017-09-22: qty 1

## 2017-09-22 MED ORDER — SODIUM CHLORIDE 0.9 % IV SOLN
INTRAVENOUS | Status: DC
  Administered 2017-09-22: 20:00:00 via INTRAVENOUS

## 2017-09-22 MED ORDER — PEG 3350-KCL-NA BICARB-NACL 420 G PO SOLR
4000.0000 mL | Freq: Once | ORAL | Status: AC
  Administered 2017-09-22: 4000 mL via ORAL
  Filled 2017-09-22: qty 4000

## 2017-09-22 NOTE — Care Management Note (Signed)
Case Management Note  Patient Details  Name: Jodi Bell MRN: [id number] Date of Birth: 10/20/1960  Subjective/Objective:    Continues to have bloody stools/hgb dropped down to 6.5 overnight rec'd one unit of prbc hgb 8.4 this am/                Action/Plan: cdolonscopy planned for [id number] due to amount of blood in colon/ will follow for cm needs  Expected Discharge Date:                  Expected Discharge Plan:  Home/Self Care  In-House Referral:     Discharge planning Services  CM Consult  Post Acute Care Choice:    Choice offered to:     DME Arranged:    DME Agency:     HH Arranged:    [place] Agency:     Status of Service:  In process, will continue to follow  If discussed at Long Length of Stay Meetings, dates discussed:    Additional Comments:  Leeroy Cha, RN 09/22/2017, 10:59 AM

## 2017-09-22 NOTE — Anesthesia Preprocedure Evaluation (Addendum)
Anesthesia Evaluation  Patient identified by MRN, date of birth, ID band Patient awake    Reviewed: Allergy & Precautions, NPO status , Patient's Chart, lab work & pertinent test results  Airway Mallampati: I       Dental  (+) Dental Advisory Given, Loose,  One loose tooth, otherwise teeth intact:   Pulmonary asthma , COPD,  COPD inhaler, Current Smoker,    breath sounds clear to auscultation       Cardiovascular hypertension,  Rhythm:Regular Rate:Normal  TTE 2015 EF 60%, no valvular abnormalities   Neuro/Psych negative neurological ROS  negative psych ROS   GI/Hepatic Neg liver ROS, hiatal hernia, GERD  ,Presented to ED with hematochezia. Hgb 6.3. Received 3U pRBCs. Repeat Hgb 8.4. This morning, Hgb 6.5, received 1 U pRBCs, repeat Hgb 8.7   Endo/Other  diabetes  Renal/GU ESRFRenal disease  negative genitourinary   Musculoskeletal  (+) Arthritis , Osteoarthritis,    Abdominal   Peds  Hematology  (+) anemia ,   Anesthesia Other Findings   Reproductive/Obstetrics                            Anesthesia Physical Anesthesia Plan  ASA: III  Anesthesia Plan: MAC   Post-op Pain Management:    Induction: Intravenous  PONV Risk Score and Plan: 1 and Propofol infusion and Treatment may vary due to age or medical condition  Airway Management Planned: Natural Airway and Nasal Cannula  Additional Equipment:   Intra-op Plan:   Post-operative Plan:   Informed Consent:   Plan Discussed with: CRNA  Anesthesia Plan Comments:         Anesthesia Quick Evaluation

## 2017-09-22 NOTE — Progress Notes (Signed)
[facility] Progress Note  Subjective: The patient had further rectal bleeding last night.  Hemoglobin dropped to 6.5.  She was transfused blood.  Hemoglobin improved today.  Patient denies any bleeding today since last night  Objective: Vital signs in last 24 hours: Temp:  [97.6 F (36.4 C)-98.4 F (36.9 C)] 97.9 F (36.6 C) (08/13 0800) Pulse Rate:  [66-86] 80 (08/13 0800) Resp:  [10-29] 16 (08/13 0800) BP: (141-183)/(74-102) 156/85 (08/13 0907) SpO2:  [97 %-100 %] 99 % (08/13 0800) Weight change:    PE:  No distress  Heart regular rhythm  Lungs clear  Abdomen soft slight lower abdominal discomfort  Lab Results: Results for orders placed or performed during the hospital encounter of 09/20/17 (from the past 24 hour(s))  Glucose, capillary     Status: Abnormal   Collection Time: 09/21/17 11:35 AM  Result Value Ref Range   Glucose-Capillary 112 (H) 70 - 99 mg/dL  Glucose, capillary     Status: Abnormal   Collection Time: 09/21/17  4:38 PM  Result Value Ref Range   Glucose-Capillary 164 (H) 70 - 99 mg/dL  Glucose, capillary     Status: Abnormal   Collection Time: 09/21/17  6:28 PM  Result Value Ref Range   Glucose-Capillary 171 (H) 70 - 99 mg/dL  Glucose, capillary     Status: Abnormal   Collection Time: 09/21/17  8:08 PM  Result Value Ref Range   Glucose-Capillary 146 (H) 70 - 99 mg/dL   Comment 1 Notify RN    Comment 2 Document in Chart   Hemoglobin and hematocrit, blood     Status: Abnormal   Collection Time: 09/21/17  9:52 PM  Result Value Ref Range   Hemoglobin 6.5 (LL) 12.0 - 15.0 g/dL   HCT 19.4 (L) 36.0 - 46.0 %  Glucose, capillary     Status: Abnormal   Collection Time: 09/21/17 11:49 PM  Result Value Ref Range   Glucose-Capillary 108 (H) 70 - 99 mg/dL   Comment 1 Notify RN    Comment 2 Document in Chart   Prepare RBC     Status: None   Collection Time: 09/22/17  1:13 AM  Result Value Ref Range   Order Confirmation      ORDER PROCESSED  BY BLOOD BANK Performed at [facility], [place] [address] [place], [place] [address]   Glucose, capillary     Status: Abnormal   Collection Time: 09/22/17  3:50 AM  Result Value Ref Range   Glucose-Capillary 123 (H) 70 - 99 mg/dL   Comment 1 Notify RN    Comment 2 Document in Chart   CBC WITH DIFFERENTIAL     Status: Abnormal   Collection Time: 09/22/17  3:52 AM  Result Value Ref Range   WBC 7.4 4.0 - 10.5 K/uL   RBC 2.88 (L) 3.87 - 5.11 MIL/uL   Hemoglobin 8.4 (L) 12.0 - 15.0 g/dL   HCT 25.0 (L) 36.0 - 46.0 %   MCV 86.8 78.0 - 100.0 fL   MCH 29.2 26.0 - 34.0 pg   MCHC 33.6 30.0 - 36.0 g/dL   RDW 15.6 (H) 11.5 - 15.5 %   Platelets 214 150 - 400 K/uL   Neutrophils Relative % 60 %   Neutro Abs 4.4 1.7 - 7.7 K/uL   Lymphocytes Relative 26 %   Lymphs Abs 1.9 0.7 - 4.0 K/uL   Monocytes Relative 10 %   Monocytes Absolute 0.8 0.1 - 1.0 K/uL   Eosinophils  Relative 4 %   Eosinophils Absolute 0.3 0.0 - 0.7 K/uL   Basophils Relative 0 %   Basophils Absolute 0.0 0.0 - 0.1 K/uL  Basic metabolic panel     Status: Abnormal   Collection Time: 09/22/17  3:52 AM  Result Value Ref Range   Sodium 137 135 - 145 mmol/L   Potassium 3.9 3.5 - 5.1 mmol/L   Chloride 109 98 - 111 mmol/L   CO2 20 (L) 22 - 32 mmol/L   Glucose, Bld 128 (H) 70 - 99 mg/dL   BUN 49 (H) 6 - 20 mg/dL   Creatinine, Ser 4.69 (H) 0.44 - 1.00 mg/dL   Calcium 9.2 8.9 - 10.3 mg/dL   GFR calc non Af Amer 9 (L) >60 mL/min   GFR calc Af Amer 11 (L) >60 mL/min   Anion gap 8 5 - 15  Glucose, capillary     Status: None   Collection Time: 09/22/17  8:17 AM  Result Value Ref Range   Glucose-Capillary 89 70 - 99 mg/dL   Comment 1 Notify RN    Comment 2 Document in Chart     Studies/Results: No results found.    Assessment: Lower GI bleed etiology unclear  Blood loss anemia  Plan:   We will plan colonoscopy tomorrow.  Continue to monitor for further bleeding.  If she has excessive bleeding again it  would be reasonable to do a nuclear medicine GI bleeding scan.    SAM F Jakira Mcfadden 09/22/2017, 10:51 AM  Pager: [phone] If no answer or after 5 PM call [phone]

## 2017-09-22 NOTE — Progress Notes (Signed)
PROGRESS NOTE  Jodi Bell OBS:[id number] DOB: 20-Dec-1960 DOA: 09/20/2017 PCP: [facility]  HPI/Recap of past 69 hours: 57 year old female with medical history significant for ESRD, psoriatic arthritis, asthma/COPD, chronic back pain, DM type II, hypertension, history of Candida esophagitis, presents to the ER complaining of about 3 episodes of BRBPR, with an additional 3 episodes while in the ED X 1 day.  Complained of associated left lower quadrant discomfort, with some fatigue.  Patient denies any regular NSAID use, or any prior episodes.  Patient does report frequent dark stools but is on iron supplementation.  In the ED, patient was noted to have a hemoglobin of 6.3, baseline around 8.5, creatinine 6.06, baseline around 5.  GI consulted.  Patient admitted for further management.  Today, patient reports feeling the same, had an episode of BRBPR last night, with acute drop in hemoglobin. Denies any abdominal pain, nausea/vomiting, fever/chills, chest pain, shortness of breath. 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 GI on board- Plan for colonoscopy on 09/23/17 Trend CBC Monitor closely  Acute drop in hgb/anemia of CKD Hgb on admission 6.3, baseline around 8.5 Likely worsened by above S/P 2 PRBC with improvement, dropped to 6.5 on  8/12, s/p 1 U now 8.4 Trend CBC  ESRD Not on HD yet, still makes urine Cr around  baseline Follows with nephrology: Pt has been constantly advised to start HD and see vascular for access  Type 2 DM Controlled Last A1c 6.9% on 06/19/17 SSI, accuchecks, hypoglycemic protocol Hold home regimen for now  HTN Stable Continue norvasc, clonidine Hold hydralazine given GI bleed Monitor closely  COPD/asthma Stable Nebulizer as needed Supplemental oxygen as needed Continue Flonase, singular  GERD Continue PPI  Psoriatic arthritis Continue Enbrel injections  Chronic back pain Continue home oxycodone as needed  Tobacco abuse Advised to quit Nicotine patch offered     Code Status: Full  Family Communication: None at bedside  Disposition Plan: Home once stable   Consultants:  GI  Procedures:  None  Antimicrobials:  None  DVT prophylaxis: SCDs   Objective: Vitals:   09/22/17 1100 09/22/17 1200 09/22/17 1515 09/22/17 1544  BP: (!) 155/82 132/81 129/79   Pulse: 75 78 81 79  Resp: 15 20 14 14   Temp:  98 F (36.7 C)    TempSrc:  Oral    SpO2: 98% 98% 97% 100%  Weight:      Height:        Intake/Output Summary (Last 24 hours) at 09/22/2017 1556 Last data filed at 09/22/2017 1140 Gross per 24 hour  Intake 1822.22 ml  Output 1150 ml  Net 672.22 ml   Filed Weights   09/20/17 0805  Weight: 80.7 kg    Exam:   General: NAD  Cardiovascular: S1, S2 present  Respiratory: CTA B  Abdomen: Soft, nontender, nondistended, bowel sounds present  Musculoskeletal: No pedal edema bilaterally  Skin: Normal  Psychiatry: Normal mood  Data Reviewed: CBC: Recent Labs  Lab 09/20/17 0818  09/21/17 0049 09/21/17 0333 09/21/17 0849 09/21/17 2152 09/22/17 0352  WBC 8.8  --   --  7.8  --   --  7.4  NEUTROABS  --   --   --  5.0  --   --  4.4  HGB 6.3*   < > 7.7* 7.2* 7.6* 6.5* 8.4*  HCT 19.3*   < > 23.1* 21.8* 22.6* 19.4* 25.0*  MCV 90.2  --   --  89.0  --   --  86.8  PLT 292  --   --  205  --   --  214   < > = values in this  interval not displayed.   Basic Metabolic Panel: Recent Labs  Lab 09/20/17 0818 09/20/17 0858 09/21/17 0333 09/22/17 0352  NA 141 141 143 137  K 3.6 3.6 3.6 3.9  CL 112* 112* 115* 109  CO2 22  --  22 20*  GLUCOSE 118* 110* 93 128*  BUN 57* 57* 53* 49*  CREATININE 6.06* 6.70* 5.14* 4.69*  CALCIUM 9.0  --  8.9 9.2   GFR: Estimated Creatinine Clearance: 13 mL/min (A) (by C-G formula based on SCr of 4.69 mg/dL (H)). Liver Function Tests: Recent Labs  Lab 09/20/17 0818 09/21/17 0333  AST 14* 10*  ALT 8 9  ALKPHOS 68 50  BILITOT 0.4 0.4  PROT 6.5 5.2*  ALBUMIN 2.8* 2.3*   No results for input(s): LIPASE, AMYLASE in the last 168 hours. No results for input(s): AMMONIA in the last 168 hours. Coagulation Profile: No results for input(s): INR, PROTIME in the last 168 hours. Cardiac Enzymes: No results for input(s): CKTOTAL, CKMB, CKMBINDEX, TROPONINI in the last 168 hours. BNP (last 3 results) No results for input(s): PROBNP in the last 8760 hours. HbA1C: No results for input(s): HGBA1C in the last 72 hours. CBG: Recent Labs  Lab 09/21/17 2008 09/21/17 2349 09/22/17 0350 09/22/17 0817 09/22/17 1142  GLUCAP 146* 108* 123* 89 122*   Lipid Profile: No results for input(s): CHOL, HDL, LDLCALC, TRIG, CHOLHDL, LDLDIRECT in the last 72 hours. Thyroid Function Tests: No results for input(s): TSH, T4TOTAL, FREET4, T3FREE, THYROIDAB in the last 72 hours. Anemia Panel: No results for input(s): VITAMINB12, FOLATE, FERRITIN, TIBC, IRON, RETICCTPCT in the last 72 hours. Urine analysis:    Component Value Date/Time   COLORURINE YELLOW 08/17/2015 1621   APPEARANCEUR CLEAR 08/17/2015 1621   LABSPEC 1.015 08/17/2015 1621   PHURINE 5.5 08/17/2015 1621   GLUCOSEU NEGATIVE 08/17/2015 1621   HGBUR NEGATIVE 08/17/2015 1621   BILIRUBINUR NEGATIVE 08/17/2015 1621   KETONESUR NEGATIVE 08/17/2015 1621   PROTEINUR 100 (A) 08/17/2015 1621   UROBILINOGEN 0.2 09/05/2014 0013   NITRITE  NEGATIVE 08/17/2015 1621   LEUKOCYTESUR NEGATIVE 08/17/2015 1621   Sepsis Labs: @LABRCNTIP (procalcitonin:4,lacticidven:4)  ) Recent Results (from the past 240 hour(s))  MRSA PCR Screening     Status: None   Collection Time: 09/20/17 11:17 AM  Result Value Ref Range Status   MRSA by PCR NEGATIVE NEGATIVE Final    Comment:        The GeneXpert MRSA Assay (FDA approved for NASAL specimens only), is one component of a comprehensive MRSA colonization surveillance program. It is not intended to diagnose MRSA infection nor to guide or monitor treatment for MRSA infections. Performed at [facility], [place] [address], [place], [place] [address]       Studies: No results found.  Scheduled  Meds: . amLODipine  10 mg Oral Daily  . cloNIDine  0.2 mg Oral BID  . fluticasone  1 spray Each Nare Daily  . furosemide  40 mg Oral Daily  . insulin aspart  0-9 Units Subcutaneous Q4H  . loratadine  10 mg Oral Daily  . montelukast  10 mg Oral QHS  . polyethylene glycol-electrolytes  4,000 mL Oral Once  . predniSONE  2.5 mg Oral Q breakfast  . sodium chloride flush  3 mL Intravenous Q12H  . zolpidem  5 mg Oral QHS    Continuous Infusions: . sodium chloride Stopped (09/21/17 0832)  . famotidine (PEPCID) IV Stopped (09/21/17 2137)     LOS: 2 days     Alma Friendly, MD [facility]   If 7PM-7AM, please contact night-coverage [email or web address] Password [facility] 09/22/2017, 3:56 PM

## 2017-09-23 ENCOUNTER — Inpatient Hospital Stay (HOSPITAL_COMMUNITY): Payer: Medicare PPO | Admitting: Anesthesiology

## 2017-09-23 ENCOUNTER — Encounter (HOSPITAL_COMMUNITY)
Admission: EM | Disposition: A | Discharge status: Home / Self Care | Payer: Self-pay | Source: Home / Self Care | Attending: Internal Medicine

## 2017-09-23 ENCOUNTER — Encounter (HOSPITAL_COMMUNITY): Payer: Self-pay | Admitting: *Deleted

## 2017-09-23 DIAGNOSIS — K921 Melena: Secondary | ICD-10-CM

## 2017-09-23 HISTORY — PX: BIOPSY: SHX5522

## 2017-09-23 HISTORY — PX: COLONOSCOPY WITH PROPOFOL: SHX5780

## 2017-09-23 HISTORY — PX: SUBMUCOSAL INJECTION: SHX5543

## 2017-09-23 HISTORY — PX: ESOPHAGOGASTRODUODENOSCOPY (EGD) WITH PROPOFOL: SHX5813

## 2017-09-23 LAB — GLUCOSE, CAPILLARY
Glucose-Capillary: 104 mg/dL — ABNORMAL HIGH (ref 70–99)
Glucose-Capillary: 96 mg/dL (ref 70–99)
Glucose-Capillary: 117 mg/dL — ABNORMAL HIGH (ref 70–99)
Glucose-Capillary: 139 mg/dL — ABNORMAL HIGH (ref 70–99)
Glucose-Capillary: 128 mg/dL — ABNORMAL HIGH (ref 70–99)

## 2017-09-23 LAB — BASIC METABOLIC PANEL
Anion gap: 7 (ref 5–15)
BUN: 45 mg/dL — ABNORMAL HIGH (ref 6–20)
CO2: 22 mmol/L (ref 22–32)
Calcium: 8.7 mg/dL — ABNORMAL LOW (ref 8.9–10.3)
Chloride: 112 mmol/L — ABNORMAL HIGH (ref 98–111)
Creatinine, Ser: 4.75 mg/dL — ABNORMAL HIGH (ref 0.44–1.00)
GFR calc Af Amer: 11 mL/min — ABNORMAL LOW (ref >60)
GFR calc non Af Amer: 9 mL/min — ABNORMAL LOW (ref >60)
Glucose, Bld: 111 mg/dL — ABNORMAL HIGH (ref 70–99)
Potassium: 4.3 mmol/L (ref 3.5–5.1)
Sodium: 141 mmol/L (ref 135–145)

## 2017-09-23 LAB — CBC
HCT: 24.9 % — ABNORMAL LOW (ref 36.0–46.0)
Hemoglobin: 8.5 g/dL — ABNORMAL LOW (ref 12.0–15.0)
MCH: 30.2 pg (ref 26.0–34.0)
MCHC: 34.1 g/dL (ref 30.0–36.0)
MCV: 88.6 fL (ref 78.0–100.0)
Platelets: 190 10*3/uL (ref 150–400)
RBC: 2.81 MIL/uL — ABNORMAL LOW (ref 3.87–5.11)
RDW: 15.6 % — ABNORMAL HIGH (ref 11.5–15.5)
WBC: 7.9 10*3/uL (ref 4.0–10.5)

## 2017-09-23 LAB — CBC WITH DIFFERENTIAL/PLATELET
Basophils Absolute: 0 10*3/uL (ref 0.0–0.1)
Basophils Relative: 0 %
Eosinophils Absolute: 0.3 10*3/uL (ref 0.0–0.7)
Eosinophils Relative: 4 %
HCT: 20 % — ABNORMAL LOW (ref 36.0–46.0)
Hemoglobin: 6.7 g/dL — CL (ref 12.0–15.0)
Lymphocytes Relative: 33 %
Lymphs Abs: 2.7 10*3/uL (ref 0.7–4.0)
MCH: 29.6 pg (ref 26.0–34.0)
MCHC: 33.5 g/dL (ref 30.0–36.0)
MCV: 88.5 fL (ref 78.0–100.0)
Monocytes Absolute: 0.6 10*3/uL (ref 0.1–1.0)
Monocytes Relative: 7 %
Neutro Abs: 4.4 10*3/uL (ref 1.7–7.7)
Neutrophils Relative %: 56 %
Platelets: 203 10*3/uL (ref 150–400)
RBC: 2.26 MIL/uL — ABNORMAL LOW (ref 3.87–5.11)
RDW: 16.4 % — ABNORMAL HIGH (ref 11.5–15.5)
WBC: 8 10*3/uL (ref 4.0–10.5)

## 2017-09-23 LAB — PREPARE RBC (CROSSMATCH)

## 2017-09-23 SURGERY — COLONOSCOPY WITH PROPOFOL
Anesthesia: Monitor Anesthesia Care

## 2017-09-23 MED ORDER — BISOPROLOL FUMARATE 5 MG PO TABS
20.0000 mg | ORAL_TABLET | Freq: Every day | ORAL | Status: DC
  Administered 2017-09-23 – 2017-09-28 (×6): 20 mg via ORAL
  Filled 2017-09-23 (×6): qty 4

## 2017-09-23 MED ORDER — SODIUM CHLORIDE 0.9% IV SOLUTION
Freq: Once | INTRAVENOUS | Status: AC
Start: 2017-09-23 — End: 2017-09-23
  Administered 2017-09-23: 05:00:00 via INTRAVENOUS

## 2017-09-23 MED ORDER — PROPOFOL 10 MG/ML IV BOLUS
INTRAVENOUS | Status: AC
  Filled 2017-09-23: qty 40

## 2017-09-23 MED ORDER — PROPOFOL 10 MG/ML IV BOLUS
INTRAVENOUS | Status: AC
  Filled 2017-09-23: qty 20

## 2017-09-23 MED ORDER — LIDOCAINE 2% (20 MG/ML) 5 ML SYRINGE
INTRAMUSCULAR | Status: DC | PRN
  Administered 2017-09-23: 100 mg via INTRAVENOUS

## 2017-09-23 MED ORDER — SODIUM CHLORIDE 0.9% IV SOLUTION: Freq: Once | INTRAVENOUS | Status: AC

## 2017-09-23 MED ORDER — PROPOFOL 500 MG/50ML IV EMUL
INTRAVENOUS | Status: DC | PRN
  Administered 2017-09-23: 140 ug/kg/min via INTRAVENOUS

## 2017-09-23 MED ORDER — EPINEPHRINE PF 1 MG/10ML IJ SOSY
PREFILLED_SYRINGE | INTRAMUSCULAR | Status: AC
  Filled 2017-09-23: qty 10

## 2017-09-23 MED ORDER — PROPOFOL 10 MG/ML IV BOLUS
INTRAVENOUS | Status: DC | PRN
  Administered 2017-09-23 (×4): 20 mg via INTRAVENOUS

## 2017-09-23 MED ORDER — SODIUM CHLORIDE 0.9 % IJ SOLN
PREFILLED_SYRINGE | INTRAMUSCULAR | Status: DC | PRN
  Administered 2017-09-23: 2 mL

## 2017-09-23 MED ORDER — PHENYLEPHRINE 40 MCG/ML (10ML) SYRINGE FOR IV PUSH (FOR BLOOD PRESSURE SUPPORT)
PREFILLED_SYRINGE | INTRAVENOUS | Status: DC | PRN
  Administered 2017-09-23 (×3): 80 ug via INTRAVENOUS

## 2017-09-23 SURGICAL SUPPLY — 22 items

## 2017-09-23 NOTE — H&P (Signed)
The patient presents to the endoscopy department for further evaluation of gastrointestinal bleeding.  It is felt this is most likely a lower GI bleed however because of continued bleeding I want to make sure there is not an upper GI component to this bleed as well.  We therefore re-discussed evaluation and in addition to colonoscopy we will include an EGD which the patient is agreeable to.  Physical:  No distress  Heart regular rhythm no murmurs  Lungs clear  Abdomen soft and nontender  Impression: GI bleed  Plan EGD and colonoscopy

## 2017-09-23 NOTE — Progress Notes (Signed)
PROGRESS NOTE  Jodi Bell:[id number] DOB: 01/12/1961 DOA: 09/20/2017 [facility]: [facility]  HPI/Recap of past 43 hours: 57 year old female with medical history significant for ESRD, psoriatic arthritis, asthma/COPD, chronic back pain, DM type II, hypertension, history of Candida esophagitis, presents to the ER complaining of about 3 episodes of BRBPR, with an additional 3 episodes while in the ED X 1 day.  Complained of associated left lower quadrant discomfort, with some fatigue.  Patient denies any regular NSAID use, or any prior episodes.  Patient does report frequent dark stools but is on iron supplementation.  In the ED, patient was noted to have a hemoglobin of 6.3, baseline around 8.5, creatinine 6.06, baseline around 5.  GI consulted.  Patient admitted for further management.  Today, feeling same, no nausea no vomiting.  No fever no chills.  No abdominal pain.   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 GI on board-underwent EGD colonoscopy.  EGD was normal.  Colonoscopy showed multiple ulcers. H&H appropriately elevated after transfusion.  We will recheck tomorrow.  Acute drop in hgb/anemia of CKD Hgb on admission 6.3, baseline around 8.5 Likely worsened by above S/P 2 PRBC with improvement, dropped to 6.5 on  8/12, s/p 1 U now 8.4 Trend CBC  ESRD Not on HD yet, still makes urine Cr around baseline Follows with nephrology: Pt has been constantly advised to  start HD and see vascular for access  Type 2 DM Controlled Last A1c 6.9% on 06/19/17 SSI, accuchecks, hypoglycemic protocol Hold home regimen for now  HTN Stable Continue norvasc, clonidine Hold hydralazine given GI bleed Monitor closely  COPD/asthma Stable Nebulizer as needed Supplemental oxygen as needed Continue Flonase, singular  GERD Continue PPI  Psoriatic arthritis Continue Enbrel injections  Chronic back pain Continue home oxycodone as needed  Tobacco abuse Advised to quit Nicotine patch offered     Code Status: Full  Family Communication: None at bedside  Disposition Plan: Home once stable   Consultants:  GI  Procedures:  None  Antimicrobials:  None  DVT prophylaxis: SCDs   Objective: Vitals:   09/23/17 1500 09/23/17 1600 09/23/17 1615 09/23/17 1700  BP:   125/75   Pulse:   75 74  Resp: 20  18 19   Temp:  98.1 F (36.7 C)    TempSrc:  Oral    SpO2:   100% 100%  Weight:      Height:        Intake/Output Summary (Last 24 hours) at 09/23/2017 1830 Last data filed at 09/23/2017 1050 Gross per 24 hour  Intake 515 ml  Output -  Net 515 ml   Filed Weights   09/20/17 0805 09/23/17 0929  Weight: 80.7 kg 80.7 kg    Exam:   General: NAD  Cardiovascular: S1, S2 present  Respiratory: CTA B  Abdomen: Soft, nontender, nondistended, bowel sounds present  Musculoskeletal: No pedal edema bilaterally  Skin: Normal  Psychiatry: Normal mood   Data Reviewed: CBC:  Recent Labs  Lab 09/21/17 0333  09/21/17 2152 09/22/17 0352 09/22/17 2116 09/23/17 0311 09/23/17 0848  WBC 7.8  --   --  7.4 8.8 8.0 7.9  NEUTROABS 5.0  --   --  4.4  --  4.4  --   HGB 7.2*   < > 6.5* 8.4* 9.2* 6.7* 8.5*  HCT 21.8*   < > 19.4* 25.0* 26.9* 20.0* 24.9*  MCV 89.0  --   --  86.8 87.6 88.5 88.6  PLT 205  --   --  214 248 203 190   < > = values in this interval not displayed.   Basic Metabolic Panel: Recent Labs  Lab 09/20/17 0818  09/20/17 0858 09/21/17 0333 09/22/17 0352 09/23/17 0311  NA 141 141 143 137 141  K 3.6 3.6 3.6 3.9 4.3  CL 112* 112* 115* 109 112*  CO2 22  --  22 20* 22  GLUCOSE 118* 110* 93 128* 111*  BUN 57* 57* 53* 49* 45*  CREATININE 6.06* 6.70* 5.14* 4.69* 4.75*  CALCIUM 9.0  --  8.9 9.2 8.7*   GFR: Estimated Creatinine Clearance: 12.9 mL/min (A) (by C-G formula based on SCr of 4.75 mg/dL (H)). Liver Function Tests: Recent Labs  Lab 09/20/17 0818 09/21/17 0333  AST 14* 10*  ALT 8 9  ALKPHOS 68 50  BILITOT 0.4 0.4  PROT 6.5 5.2*  ALBUMIN 2.8* 2.3*   No results for input(s): LIPASE, AMYLASE in the last 168 hours. No results for input(s): AMMONIA in the last 168 hours. Coagulation Profile: No results for input(s): INR, PROTIME in the last 168 hours. Cardiac Enzymes: No results for input(s): CKTOTAL, CKMB, CKMBINDEX, TROPONINI in the last 168 hours. BNP (last 3 results) No results for input(s): PROBNP in the last 8760 hours. HbA1C: No results for input(s): HGBA1C in the last 72 hours. CBG: Recent Labs  Lab 09/22/17 2343 09/23/17 0344 09/23/17 0820 09/23/17 1152 09/23/17 1614  GLUCAP 108* 104* 96 117* 139*   Lipid Profile: No results for input(s): CHOL, HDL, LDLCALC, TRIG, CHOLHDL, LDLDIRECT in the last 72 hours. Thyroid Function Tests: No results for input(s): TSH, T4TOTAL, FREET4, T3FREE, THYROIDAB in the last 72 hours. Anemia Panel: No results for input(s): VITAMINB12, FOLATE, FERRITIN, TIBC, IRON, RETICCTPCT in the last 72 hours. Urine analysis:    Component Value Date/Time   COLORURINE YELLOW 08/17/2015 1621   APPEARANCEUR CLEAR 08/17/2015 1621   LABSPEC 1.015 08/17/2015 1621   PHURINE 5.5 08/17/2015 1621   GLUCOSEU NEGATIVE 08/17/2015 1621   HGBUR NEGATIVE 08/17/2015 1621   BILIRUBINUR NEGATIVE 08/17/2015 1621   KETONESUR NEGATIVE 08/17/2015 1621   PROTEINUR 100 (A) 08/17/2015 1621   UROBILINOGEN 0.2 09/05/2014 0013   NITRITE NEGATIVE 08/17/2015 1621    LEUKOCYTESUR NEGATIVE 08/17/2015 1621   Sepsis Labs: @LABRCNTIP (procalcitonin:4,lacticidven:4)  ) Recent Results (from the past 240 hour(s))  MRSA PCR Screening     Status: None   Collection Time: 09/20/17 11:17 AM  Result Value Ref Range Status   MRSA by PCR NEGATIVE NEGATIVE Final    Comment:        The GeneXpert MRSA Assay (FDA approved for NASAL specimens only), is one component of a comprehensive MRSA colonization surveillance program. It is not intended to diagnose MRSA infection nor to guide or monitor treatment for MRSA infections. Performed at [facility], [place] [address] [place], [place] [address]       Studies: No results found.  Scheduled Meds: . amLODipine  10 mg Oral  Daily  . bisoprolol  20 mg Oral Daily  . cloNIDine  0.2 mg Oral BID  . fluticasone  1 spray Each Nare Daily  . insulin aspart  0-9 Units Subcutaneous Q4H  . loratadine  10 mg Oral Daily  . montelukast  10 mg Oral QHS  . predniSONE  2.5 mg Oral Q breakfast  . sodium chloride flush  3 mL Intravenous Q12H  . zolpidem  5 mg Oral QHS    Continuous Infusions: . sodium chloride 0 mL (09/21/17 0832)  . famotidine (PEPCID) IV Stopped (09/22/17 2146)     LOS: 3 days     Berle Mull, MD [facility]   If 7PM-7AM, please contact night-coverage [email or web address] Password [email or web address] 09/23/2017, 6:30 PM

## 2017-09-23 NOTE — Op Note (Signed)
[facility] Patient Name: Jodi Bell Procedure Date: 09/23/2017 MRN: [id number] Attending MD: Wonda Horner , MD Date of Birth: 13-Oct-1960 CSN: [id number] Age: 57 Admit Type: Inpatient Procedure:                Upper GI endoscopy Indications:              Gastrointestinal bleeding of unknown origin Providers:                Wonda Horner, MD, Kingsley Plan, RN, William Dalton, Technician Referring MD:              Medicines:                Propofol per Anesthesia Complications:            No immediate complications. Estimated Blood Loss:     Estimated blood loss: none. Procedure:                Pre-Anesthesia Assessment:                           - Prior to the procedure, a History and Physical                            was performed, and patient medications and                            allergies were reviewed. The patient's tolerance of                            previous anesthesia was also reviewed. The risks                            and benefits of the procedure and the sedation                            options and risks were discussed with the patient.                            All questions were answered, and informed consent                            was obtained. Prior Anticoagulants: The patient has                            taken no previous anticoagulant or antiplatelet                            agents. ASA Grade Assessment: III - A patient with                            severe systemic disease. After reviewing the risks  and benefits, the patient was deemed in                            satisfactory condition to undergo the procedure.                           After obtaining informed consent, the endoscope was                            passed under direct vision. Throughout the                            procedure, the patient's blood pressure, pulse, and   oxygen saturations were monitored continuously. The                            GIF-H190 ([id number] Olympus adult endoscope was                            introduced through the mouth, and advanced to the                            second part of duodenum. The upper GI endoscopy was                            accomplished without difficulty. The patient                            tolerated the procedure well. Scope In: Scope Out: Findings:      The examined esophagus was normal.      A small hiatal hernia was present.      The examined duodenum was normal. Impression:               - Normal esophagus.                           - Small hiatal hernia.                           - Normal examined duodenum.                           - No specimens collected. Moderate Sedation:      . Recommendation:           - Advance diet as tolerated.                           - Continue present medications. Procedure Code(s):        --- Professional ---                           [phone], Esophagogastroduodenoscopy, flexible,                            transoral; diagnostic, including collection of  specimen(s) by brushing or washing, when performed                            (separate procedure) Diagnosis Code(s):        --- Professional ---                           K44.9, Diaphragmatic hernia without obstruction or                            gangrene                           K92.2, Gastrointestinal hemorrhage, unspecified CPT copyright 2017 American Medical Association. All rights reserved. The codes documented in this report are preliminary and upon coder review may  be revised to meet current compliance requirements. Wonda Horner, MD 09/23/2017 10:53:11 AM This report has been signed electronically. Number of Addenda: 0

## 2017-09-23 NOTE — Anesthesia Postprocedure Evaluation (Signed)
Anesthesia Post Note  Patient: [name]  Procedure(s) Performed: COLONOSCOPY WITH PROPOFOL Hemostatic clips placed (N/A ) ESOPHAGOGASTRODUODENOSCOPY (EGD) WITH PROPOFOL (N/A ) SUBMUCOSAL INJECTION BIOPSY     Patient location during evaluation: Endoscopy Anesthesia Type: MAC Level of consciousness: awake and alert Pain management: pain level controlled Vital Signs Assessment: post-procedure vital signs reviewed and stable Respiratory status: spontaneous breathing, nonlabored ventilation and respiratory function stable Cardiovascular status: blood pressure returned to baseline and stable Postop Assessment: no apparent nausea or vomiting Anesthetic complications: no    Last Vitals:  Vitals:   09/23/17 0929 09/23/17 1050  BP: (!) 189/89 135/71  Pulse: 85 72  Resp: 16 16  Temp: 36.8 C 36.8 C  SpO2: 100%     Last Pain:  Vitals:   09/23/17 1050  TempSrc: Oral  PainSc: 0-No pain                 Harlowe Dowler L Murlin Schrieber

## 2017-09-23 NOTE — Transfer of Care (Signed)
Immediate Anesthesia Transfer of Care Note  Patient: Jodi Bell  Procedure(s) Performed: COLONOSCOPY WITH PROPOFOL Hemostatic clips placed (N/A ) ESOPHAGOGASTRODUODENOSCOPY (EGD) WITH PROPOFOL (N/A ) SUBMUCOSAL INJECTION BIOPSY  Patient Location: Endoscopy Unit  Anesthesia Type:MAC  Level of Consciousness: awake and alert   Airway & Oxygen Therapy: Patient Spontanous Breathing and Patient connected to nasal cannula oxygen  Post-op Assessment: Report given to RN and Post -op Vital signs reviewed and stable  Post vital signs: Reviewed and stable  Last Vitals:  Vitals Value Taken Time  BP    Temp    Pulse 73 09/23/2017 10:50 AM  Resp    SpO2 100 % 09/23/2017 10:50 AM  Vitals shown include unvalidated device data.  Last Pain:  Vitals:   09/23/17 0929  TempSrc: Oral  PainSc: 0-No pain         Complications: No apparent anesthesia complications

## 2017-09-23 NOTE — Anesthesia Procedure Notes (Signed)
Date/Time: 09/23/2017 10:02 AM Performed by: Sharlette Dense, CRNA Oxygen Delivery Method: Nasal cannula

## 2017-09-23 NOTE — Progress Notes (Signed)
CRITICAL VALUE ALERT  Critical Value:  Hgb is 6.5  Date & Time Notied:  4210 09/23/2017  Provider Notified: [facility]   Orders Received/Actions taken: Waiting for orders; will continue to monitor

## 2017-09-23 NOTE — Op Note (Signed)
[facility] Patient Name: Jodi Bell Procedure Date: 09/23/2017 MRN: [id number] Attending MD: Wonda Horner , MD Date of Birth: 1960-02-29 CSN: [id number] Age: 57 Admit Type: Inpatient Procedure:                Colonoscopy Indications:              Gastrointestinal bleeding Providers:                Wonda Horner, MD, Kingsley Plan, RN, William Dalton, Technician Referring MD:              Medicines:                Propofol per Anesthesia Complications:            No immediate complications. Estimated Blood Loss:     Estimated blood loss: none. Procedure:                Pre-Anesthesia Assessment:                           - Prior to the procedure, a History and Physical                            was performed, and patient medications and                            allergies were reviewed. The patient's tolerance of                            previous anesthesia was also reviewed. The risks                            and benefits of the procedure and the sedation                            options and risks were discussed with the patient.                            All questions were answered, and informed consent                            was obtained. Prior Anticoagulants: The patient has                            taken no previous anticoagulant or antiplatelet                            agents. ASA Grade Assessment: III - A patient with                            severe systemic disease. After reviewing the risks                            and  benefits, the patient was deemed in                            satisfactory condition to undergo the procedure.                           - Prior to the procedure, a History and Physical                            was performed, and patient medications and                            allergies were reviewed. The patient's tolerance of                            previous anesthesia was also  reviewed. The risks                            and benefits of the procedure and the sedation                            options and risks were discussed with the patient.                            All questions were answered, and informed consent                            was obtained. Prior Anticoagulants: The patient has                            taken no previous anticoagulant or antiplatelet                            agents. ASA Grade Assessment: III - A patient with                            severe systemic disease. After reviewing the risks                            and benefits, the patient was deemed in                            satisfactory condition to undergo the procedure.                           After obtaining informed consent, the colonoscope                            was passed under direct vision. Throughout the                            procedure, the patient's blood pressure, pulse, and  oxygen saturations were monitored continuously. The                            CF-HQ190L ([id number] Olympus adult colonoscope was                            introduced through the anus and advanced to the the                            cecum, identified by appendiceal orifice and                            ileocecal valve. The ileocecal valve, appendiceal                            orifice, and rectum were photographed. The                            colonoscopy was performed without difficulty. The                            patient tolerated the procedure well. The quality                            of the bowel preparation was good. Scope In: 10:15:51 AM Scope Out: 10:43:04 AM Scope Withdrawal Time: 0 hours 5 minutes 37 seconds  Total Procedure Duration: 0 hours 27 minutes 13 seconds  Findings:      The perianal and digital rectal examinations were normal.      Multiple small and large-mouthed diverticula were found in the sigmoid       colon and  descending colon.      Multiple ulcers (4) were found in the proximal/mid transverse colon.       Stigmata of recent bleeding were present. There was a clot present. Area       was successfully injected with 2 mL of a 1:10,000 solution of       epinephrine for hemostasis. To prevent bleeding post-intervention, one       hemostatic clip was successfully placed. There was no bleeding at the       end of the procedure. I suspect this was the area of recurrent bleeding.       Biopsies were taken with a cold forceps for histology. Etiology of       ulcers unclear. Impression:               - Diverticulosis in the sigmoid colon and in the                            descending colon.                           - Multiple ulcers in the proximal transverse colon.                            Injected. Clip was placed. Biopsied. Moderate Sedation:      . Recommendation:           -  Advance diet as tolerated.                           - Continue present medications.                           - Await pathology results.                           - Watch for further bleeding. Procedure Code(s):        --- Professional ---                           [id number], Colonoscopy, flexible; with control of                            bleeding, any method Diagnosis Code(s):        --- Professional ---                           K63.3, Ulcer of intestine                           K92.2, Gastrointestinal hemorrhage, unspecified                           K57.30, Diverticulosis of large intestine without                            perforation or abscess without bleeding CPT copyright 2017 [employer]. All rights reserved. The codes documented in this report are preliminary and upon coder review may  be revised to meet current compliance requirements. Wonda Horner, MD 09/23/2017 11:01:52 AM This report has been signed electronically. Number of Addenda: 0

## 2017-09-24 LAB — TYPE AND SCREEN
ABO/RH(D): AB POS
Antibody Screen: NEGATIVE
Unit division: 0
Unit division: 0
Unit division: 0
Unit division: 0
Unit division: 0
Unit division: 0
Unit division: 0

## 2017-09-24 LAB — BASIC METABOLIC PANEL
Anion gap: 5 (ref 5–15)
BUN: 41 mg/dL — ABNORMAL HIGH (ref 6–20)
CO2: 21 mmol/L — ABNORMAL LOW (ref 22–32)
Calcium: 8.9 mg/dL (ref 8.9–10.3)
Chloride: 114 mmol/L — ABNORMAL HIGH (ref 98–111)
Creatinine, Ser: 4.59 mg/dL — ABNORMAL HIGH (ref 0.44–1.00)
GFR calc Af Amer: 11 mL/min — ABNORMAL LOW (ref >60)
GFR calc non Af Amer: 10 mL/min — ABNORMAL LOW (ref >60)
Glucose, Bld: 140 mg/dL — ABNORMAL HIGH (ref 70–99)
Potassium: 4.7 mmol/L (ref 3.5–5.1)
Sodium: 140 mmol/L (ref 135–145)

## 2017-09-24 LAB — BPAM RBC
Blood Product Expiration Date: 201908312359
Blood Product Expiration Date: 201908312359
Blood Product Expiration Date: 201908312359
Blood Product Expiration Date: 201909032359
Blood Product Expiration Date: 201909062359
Blood Product Expiration Date: 201909062359
Blood Product Expiration Date: 201909062359
ISSUE DATE / TIME: 201908111008
ISSUE DATE / TIME: 201908111327
ISSUE DATE / TIME: 201908122352
ISSUE DATE / TIME: 201908140514
Unit Type and Rh: 6200
Unit Type and Rh: 6200
Unit Type and Rh: 6200
Unit Type and Rh: 6200
Unit Type and Rh: 6200
Unit Type and Rh: 6200
Unit Type and Rh: 6200

## 2017-09-24 LAB — CBC WITH DIFFERENTIAL/PLATELET
Basophils Absolute: 0 10*3/uL (ref 0.0–0.1)
Basophils Relative: 0 %
Eosinophils Absolute: 0.2 10*3/uL (ref 0.0–0.7)
Eosinophils Relative: 3 %
HCT: 21.2 % — ABNORMAL LOW (ref 36.0–46.0)
Hemoglobin: 7.2 g/dL — ABNORMAL LOW (ref 12.0–15.0)
Lymphocytes Relative: 28 %
Lymphs Abs: 2 10*3/uL (ref 0.7–4.0)
MCH: 30.5 pg (ref 26.0–34.0)
MCHC: 34 g/dL (ref 30.0–36.0)
MCV: 89.8 fL (ref 78.0–100.0)
Monocytes Absolute: 0.4 10*3/uL (ref 0.1–1.0)
Monocytes Relative: 6 %
Neutro Abs: 4.4 10*3/uL (ref 1.7–7.7)
Neutrophils Relative %: 63 %
Platelets: 176 10*3/uL (ref 150–400)
RBC: 2.36 MIL/uL — ABNORMAL LOW (ref 3.87–5.11)
RDW: 16 % — ABNORMAL HIGH (ref 11.5–15.5)
WBC: 7.1 10*3/uL (ref 4.0–10.5)

## 2017-09-24 LAB — GLUCOSE, CAPILLARY
Glucose-Capillary: 104 mg/dL — ABNORMAL HIGH (ref 70–99)
Glucose-Capillary: 115 mg/dL — ABNORMAL HIGH (ref 70–99)
Glucose-Capillary: 118 mg/dL — ABNORMAL HIGH (ref 70–99)
Glucose-Capillary: 129 mg/dL — ABNORMAL HIGH (ref 70–99)
Glucose-Capillary: 142 mg/dL — ABNORMAL HIGH (ref 70–99)
Glucose-Capillary: 149 mg/dL — ABNORMAL HIGH (ref 70–99)
Glucose-Capillary: 83 mg/dL (ref 70–99)

## 2017-09-24 LAB — CBC
HCT: 22.3 % — ABNORMAL LOW (ref 36.0–46.0)
Hemoglobin: 7.7 g/dL — ABNORMAL LOW (ref 12.0–15.0)
MCH: 30.8 pg (ref 26.0–34.0)
MCHC: 34.5 g/dL (ref 30.0–36.0)
MCV: 89.2 fL (ref 78.0–100.0)
Platelets: 199 10*3/uL (ref 150–400)
RBC: 2.5 MIL/uL — ABNORMAL LOW (ref 3.87–5.11)
RDW: 15.9 % — ABNORMAL HIGH (ref 11.5–15.5)
WBC: 7.9 10*3/uL (ref 4.0–10.5)

## 2017-09-24 MED ORDER — FUROSEMIDE 40 MG PO TABS
40.0000 mg | ORAL_TABLET | Freq: Every day | ORAL | Status: DC
  Administered 2017-09-24 – 2017-09-28 (×5): 40 mg via ORAL
  Filled 2017-09-24 (×5): qty 1

## 2017-09-24 NOTE — Progress Notes (Addendum)
PROGRESS NOTE  Jodi Bell DGU:[id number] DOB: 1960/10/30 DOA: 09/20/2017 [facility]CP: [facility]  HPI/Recap of past 68 hours: 57 year old female with medical history significant for ESRD, psoriatic arthritis, asthma/COPD, chronic back pain, DM type II, hypertension, history of Candida esophagitis, presents to the ER complaining of about 3 episodes of BRBPR, with an additional 3 episodes while in the ED X 1 day.  Complained of associated left lower quadrant discomfort, with some fatigue.  Patient denies any regular NSAID use, or any prior episodes.  Patient does report frequent dark stools but is on iron supplementation.  In the ED, patient was noted to have a hemoglobin of 6.3, baseline around 8.5, creatinine 6.06, baseline around 5.  GI consulted.  Patient admitted for further management.  Today, feeling better.  No nausea no vomiting.  Still had some [place] per rectum.  Still also some abdominal pain.   Assessment/Plan: Principal Problem:   Hematochezia Active Problems:   Type 2 diabetes mellitus ([place])   Essential hypertension   BACK PAIN, LUMBAR, CHRONIC   Psoriasis arthropathica (HCC)   GERD (gastroesophageal reflux disease)   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 2 diabetes mellitus (H[facility])   COPD (chronic obstructive pulmonary disease) ([facility])   Tobacco use   Symptomatic anemia   Headache  GI bleed, likely lower Stable S/P 3U of PRBC, last 09/21/17 Reports hx of EGD in 2/19 at [facility] esophagitis, reports a normal colonoscopy in 2014 at [facility]. GI on board-underwent EGD colonoscopy.  EGD was normal.  Colonoscopy showed multiple ulcers. H&H appropriately elevated after transfusion.  We will recheck tomorrow. Diet advance to soft.  Acute drop in hgb/anemia of CKD Hgb on admission 6.3, baseline around 8.5 Likely worsened by above S/P 2 PRBC with improvement, dropped to 6.5 on  8/12, s/p 1 U H&H 7.2 and 7.7 today, stable.  Likely yesterday is 8.7 was an error since it  should be around 7 as well. Trend CBC  ESRD Not on HD yet, still makes urine Cr around baseline Follows with nephrology: Pt has been constantly advised to start HD and see vascular for access  Type 2 DM Controlled Last A1c 6.9% on 06/19/17 SSI, accuchecks, hypoglycemic protocol Hold home regimen for now  HTN Stable Continue norvasc, clonidine Hold hydralazine given GI bleed Monitor closely  COPD/asthma Stable Nebulizer as needed Supplemental oxygen as needed Continue Flonase, singular  GERD Continue PPI  Psoriatic arthritis Continue Enbrel injections  Chronic back pain Continue home oxycodone as needed  Tobacco abuse Advised to quit Nicotine patch offered     Code Status: Full  Family Communication: None at bedside  Disposition Plan: Home once stable   Consultants:  GI  Procedures:  None  Antimicrobials:  None  DVT prophylaxis: SCDs   Objective: Vitals:   09/24/17 0800 09/24/17 0852 09/24/17 0854 09/24/17 0925  BP:  (!) 155/95 (!) 155/95 (!) 142/85  Pulse: 79   72  Resp: 15  18 18   Temp:    (!) 97.5 F (36.4 C)  TempSrc:    Oral  SpO2: 100%   99%  Weight:      Height:        Intake/Output Summary (Last 24 hours) at 09/24/2017 1734 Last data filed at 09/24/2017 1319 Gross per 24 hour  Intake 300 ml  Output -  Net 300 ml   Filed Weights   09/20/17 0805 09/23/17 0929  Weight: 80.7 kg 80.7 kg    Exam:   General: NAD  Cardiovascular:  S1, S2 present  Respiratory: CTA B  Abdomen: Soft, nontender, nondistended, bowel sounds present  Musculoskeletal: No pedal edema bilaterally  Skin: Normal  Psychiatry: Normal mood   Data Reviewed: CBC: Recent Labs  Lab 09/21/17 0333  09/22/17 0352 09/22/17 2116 09/23/17 0311 09/23/17 0848 09/24/17 0330 09/24/17 1519  WBC 7.8  --  7.4 8.8 8.0 7.9 7.1 7.9  NEUTROABS 5.0  --  4.4  --  4.4  --  4.4  --   HGB 7.2*   < > 8.4* 9.2* 6.7* 8.5* 7.2* 7.7*  HCT 21.8*   < > 25.0* 26.9*  20.0* 24.9* 21.2* 22.3*  MCV 89.0  --  86.8 87.6 88.5 88.6 89.8 89.2  PLT 205  --  214 248 203 190 176 199   < > = values in this interval not displayed.   Basic Metabolic Panel: Recent Labs  Lab 09/20/17 0818 09/20/17 0858 09/21/17 0333 09/22/17 0352 09/23/17 0311 09/24/17 0330  NA 141 141 143 137 141 140  K 3.6 3.6 3.6 3.9 4.3 4.7  CL 112* 112* 115* 109 112* 114*  CO2 22  --  22 20* 22 21*  GLUCOSE 118* 110* 93 128* 111* 140*  BUN 57* 57* 53* 49* 45* 41*  CREATININE 6.06* 6.70* 5.14* 4.69* 4.75* 4.59*  CALCIUM 9.0  --  8.9 9.2 8.7* 8.9   GFR: Estimated Creatinine Clearance: 13.3 mL/min (A) (by C-G formula based on SCr of 4.59 mg/dL (H)). Liver Function Tests: Recent Labs  Lab 09/20/17 0818 09/21/17 0333  AST 14* 10*  ALT 8 9  ALKPHOS 68 50  BILITOT 0.4 0.4  PROT 6.5 5.2*  ALBUMIN 2.8* 2.3*   No results for input(s): LIPASE, AMYLASE in the last 168 hours. No results for input(s): AMMONIA in the last 168 hours. Coagulation Profile: No results for input(s): INR, PROTIME in the last 168 hours. Cardiac Enzymes: No results for input(s): CKTOTAL, CKMB, CKMBINDEX, TROPONINI in the last 168 hours. BNP (last 3 results) No results for input(s): PROBNP in the last 8760 hours. HbA1C: No results for input(s): HGBA1C in the last 72 hours. CBG: Recent Labs  Lab 09/24/17 0020 09/24/17 0405 09/24/17 0823 09/24/17 1147 09/24/17 1616  GLUCAP 118* 142* 83 115* 149*   Lipid Profile: No results for input(s): CHOL, HDL, LDLCALC, TRIG, CHOLHDL, LDLDIRECT in the last 72 hours. Thyroid Function Tests: No results for input(s): TSH, T4TOTAL, FREET4, T3FREE, THYROIDAB in the last 72 hours. Anemia Panel: No results for input(s): VITAMINB12, FOLATE, FERRITIN, TIBC, IRON, RETICCTPCT in the last 72 hours. Urine analysis:    Component Value Date/Time   COLORURINE YELLOW 08/17/2015 1621   APPEARANCEUR CLEAR 08/17/2015 1621   LABSPEC 1.015 08/17/2015 1621   PHURINE 5.5 08/17/2015  1621   GLUCOSEU NEGATIVE 08/17/2015 1621   HGBUR NEGATIVE 08/17/2015 1621   BILIRUBINUR NEGATIVE 08/17/2015 1621   KETONESUR NEGATIVE 08/17/2015 1621   PROTEINUR 100 (A) 08/17/2015 1621   UROBILINOGEN 0.2 09/05/2014 0013   NITRITE NEGATIVE 08/17/2015 1621   LEUKOCYTESUR NEGATIVE 08/17/2015 1621   Sepsis Labs: @LABRCNTIP (procalcitonin:4,lacticidven:4)  ) Recent Results (from the past 240 hour(s))  MRSA PCR Screening     Status: None   Collection Time: 09/20/17 11:17 AM  Result Value Ref Range Status   MRSA by PCR NEGATIVE NEGATIVE Final    Comment:        The GeneXpert MRSA Assay (FDA approved for NASAL specimens only), is one component of a comprehensive MRSA colonization surveillance program. It is not intended  to diagnose MRSA infection nor to guide or monitor treatment for MRSA infections. Performed at [facility], [place] [address] [place], [place] [address]       Studies: No results found.  Scheduled Meds: . amLODipine  10 mg Oral Daily  . bisoprolol  20 mg Oral Daily  . cloNIDine  0.2 mg Oral BID  . fluticasone  1 spray Each Nare Daily  . furosemide  40 mg Oral Daily  . insulin aspart  0-9 Units Subcutaneous Q4H  . loratadine  10 mg Oral Daily  . montelukast  10 mg Oral QHS  . predniSONE  2.5 mg Oral Q breakfast  . sodium chloride flush  3 mL Intravenous Q12H  . zolpidem  5 mg Oral QHS    Continuous Infusions: . sodium chloride 0 mL (09/21/17 0832)  . famotidine (PEPCID) IV Stopped (09/23/17 2147)     LOS: 4 days     Berle Mull, MD [facility]   If 7PM-7AM, please contact night-coverage [email or web address] Password [facility] 09/24/2017, 5:34 PM

## 2017-09-24 NOTE — Progress Notes (Signed)
Patient is transferred to 1410 from ICU. Admission VS is stable. Patient is AOX4.

## 2017-09-24 NOTE — Progress Notes (Signed)
[facility] Progress Note  Subjective: Doing well. Saw a little blood in stool last night, but not a lot.   Objective: Vital signs in last 24 hours: Temp:  [97.5 F (36.4 C)-98.2 F (36.8 C)] 97.5 F (36.4 C) (08/15 0925) Pulse Rate:  [71-80] 72 (08/15 0925) Resp:  [10-29] 18 (08/15 0925) BP: (116-169)/(71-95) 142/85 (08/15 0925) SpO2:  [97 %-100 %] 99 % (08/15 0925) Weight change:    PE: No distress  Lab Results: Results for orders placed or performed during the hospital encounter of 09/20/17 (from the past 24 hour(s))  Glucose, capillary     Status: Abnormal   Collection Time: 09/23/17 11:52 AM  Result Value Ref Range   Glucose-Capillary 117 (H) 70 - 99 mg/dL  Glucose, capillary     Status: Abnormal   Collection Time: 09/23/17  4:14 PM  Result Value Ref Range   Glucose-Capillary 139 (H) 70 - 99 mg/dL  Glucose, capillary     Status: Abnormal   Collection Time: 09/23/17  7:45 PM  Result Value Ref Range   Glucose-Capillary 128 (H) 70 - 99 mg/dL  Glucose, capillary     Status: Abnormal   Collection Time: 09/24/17 12:20 AM  Result Value Ref Range   Glucose-Capillary 118 (H) 70 - 99 mg/dL  CBC WITH DIFFERENTIAL     Status: Abnormal   Collection Time: 09/24/17  3:30 AM  Result Value Ref Range   WBC 7.1 4.0 - 10.5 K/uL   RBC 2.36 (L) 3.87 - 5.11 MIL/uL   Hemoglobin 7.2 (L) 12.0 - 15.0 g/dL   HCT 21.2 (L) 36.0 - 46.0 %   MCV 89.8 78.0 - 100.0 fL   MCH 30.5 26.0 - 34.0 pg   MCHC 34.0 30.0 - 36.0 g/dL   RDW 16.0 (H) 11.5 - 15.5 %   Platelets 176 150 - 400 K/uL   Neutrophils Relative % 63 %   Neutro Abs 4.4 1.7 - 7.7 K/uL   Lymphocytes Relative 28 %   Lymphs Abs 2.0 0.7 - 4.0 K/uL   Monocytes Relative 6 %   Monocytes Absolute 0.4 0.1 - 1.0 K/uL   Eosinophils Relative 3 %   Eosinophils Absolute 0.2 0.0 - 0.7 K/uL   Basophils Relative 0 %   Basophils Absolute 0.0 0.0 - 0.1 K/uL  Basic metabolic panel     Status: Abnormal   Collection Time: 09/24/17  3:30  AM  Result Value Ref Range   Sodium 140 135 - 145 mmol/L   Potassium 4.7 3.5 - 5.1 mmol/L   Chloride 114 (H) 98 - 111 mmol/L   CO2 21 (L) 22 - 32 mmol/L   Glucose, Bld 140 (H) 70 - 99 mg/dL   BUN 41 (H) 6 - 20 mg/dL   Creatinine, Ser 4.59 (H) 0.44 - 1.00 mg/dL   Calcium 8.9 8.9 - 10.3 mg/dL   GFR calc non Af Amer 10 (L) >60 mL/min   GFR calc Af Amer 11 (L) >60 mL/min   Anion gap 5 5 - 15  Glucose, capillary     Status: Abnormal   Collection Time: 09/24/17  4:05 AM  Result Value Ref Range   Glucose-Capillary 142 (H) 70 - 99 mg/dL  Glucose, capillary     Status: None   Collection Time: 09/24/17  8:23 AM  Result Value Ref Range   Glucose-Capillary 83 70 - 99 mg/dL    Studies/Results: No results found.    Assessment: LGI bleed. Diverticuli and ulcers seen in colon.  S/P clipping.    Plan:   Advance diet. Observe.    SAM F Rayven Hendrickson 09/24/2017, 10:00 AM  Pager: [phone] If no answer or after 5 PM call [phone]

## 2017-09-24 NOTE — Care Management Note (Signed)
Case Management Note  Patient Details  Name: Jodi Bell MRN: [id number] Date of Birth: 1960-12-20  Subjective/Objective: Admitted w/LGIB. From home. Has pcp(will make own pcp appt),has transp,pharmacy.Patient provided w/goodrx discount coupon,& DSS-she wil f/u if eligible for medicaid. No further CM needs.                  Action/Plan:d/c plan home.   Expected Discharge Date:                  Expected Discharge Plan:  Home/Self Care  In-House Referral:     Discharge planning Services  CM Consult  Post Acute Care Choice:    Choice offered to:     DME Arranged:    DME Agency:     HH Arranged:    HH Agency:     Status of Service:  In process, will continue to follow  If discussed at Long Length of Stay Meetings, dates discussed:    Additional Comments:  Dessa Phi, RN 09/24/2017, 1:20 PM

## 2017-09-24 NOTE — Care Management Important Message (Signed)
Important Message  Patient Details  Name: Jodi Bell MRN: [id number] Date of Birth: May 07, 1960   Medicare Important Message Given:  Yes    Kerin Salen 09/24/2017, 10:41 AMImportant Message  Patient Details  Name: Jodi Bell MRN: [id number] Date of Birth: 13-May-1960   Medicare Important Message Given:  Yes    Kerin Salen 09/24/2017, 10:41 AM

## 2017-09-25 ENCOUNTER — Encounter (HOSPITAL_COMMUNITY): Payer: Self-pay | Admitting: Gastroenterology

## 2017-09-25 LAB — PREPARE RBC (CROSSMATCH)

## 2017-09-25 LAB — CBC WITH DIFFERENTIAL/PLATELET
Basophils Absolute: 0 10*3/uL (ref 0.0–0.1)
Basophils Relative: 0 %
Eosinophils Absolute: 0.3 10*3/uL (ref 0.0–0.7)
Eosinophils Relative: 3 %
HCT: 18.7 % — ABNORMAL LOW (ref 36.0–46.0)
Hemoglobin: 6.3 g/dL — CL (ref 12.0–15.0)
Lymphocytes Relative: 33 %
Lymphs Abs: 2.8 10*3/uL (ref 0.7–4.0)
MCH: 30 pg (ref 26.0–34.0)
MCHC: 33.7 g/dL (ref 30.0–36.0)
MCV: 89 fL (ref 78.0–100.0)
Monocytes Absolute: 0.4 10*3/uL (ref 0.1–1.0)
Monocytes Relative: 5 %
Neutro Abs: 4.8 10*3/uL (ref 1.7–7.7)
Neutrophils Relative %: 59 %
Platelets: 186 10*3/uL (ref 150–400)
RBC: 2.1 MIL/uL — ABNORMAL LOW (ref 3.87–5.11)
RDW: 15.9 % — ABNORMAL HIGH (ref 11.5–15.5)
WBC: 8.2 10*3/uL (ref 4.0–10.5)

## 2017-09-25 LAB — BASIC METABOLIC PANEL
Anion gap: 5 (ref 5–15)
BUN: 41 mg/dL — ABNORMAL HIGH (ref 6–20)
CO2: 21 mmol/L — ABNORMAL LOW (ref 22–32)
Calcium: 8.8 mg/dL — ABNORMAL LOW (ref 8.9–10.3)
Chloride: 112 mmol/L — ABNORMAL HIGH (ref 98–111)
Creatinine, Ser: 4.4 mg/dL — ABNORMAL HIGH (ref 0.44–1.00)
GFR calc Af Amer: 12 mL/min — ABNORMAL LOW (ref >60)
GFR calc non Af Amer: 10 mL/min — ABNORMAL LOW (ref >60)
Glucose, Bld: 118 mg/dL — ABNORMAL HIGH (ref 70–99)
Potassium: 4.1 mmol/L (ref 3.5–5.1)
Sodium: 138 mmol/L (ref 135–145)

## 2017-09-25 LAB — GLUCOSE, CAPILLARY
Glucose-Capillary: 116 mg/dL — ABNORMAL HIGH (ref 70–99)
Glucose-Capillary: 98 mg/dL (ref 70–99)
Glucose-Capillary: 114 mg/dL — ABNORMAL HIGH (ref 70–99)
Glucose-Capillary: 130 mg/dL — ABNORMAL HIGH (ref 70–99)
Glucose-Capillary: 134 mg/dL — ABNORMAL HIGH (ref 70–99)

## 2017-09-25 LAB — HEMOGLOBIN AND HEMATOCRIT, BLOOD
HCT: 30.7 % — ABNORMAL LOW (ref 36.0–46.0)
Hemoglobin: 10.6 g/dL — ABNORMAL LOW (ref 12.0–15.0)

## 2017-09-25 MED ORDER — INSULIN ASPART 100 UNIT/ML ~~LOC~~ SOLN
0.0000 [IU] | Freq: Three times a day (TID) | SUBCUTANEOUS | Status: DC
  Administered 2017-09-25 – 2017-09-26 (×3): 1 [IU] via SUBCUTANEOUS
  Administered 2017-09-27: 3 [IU] via SUBCUTANEOUS
  Administered 2017-09-27: 2 [IU] via SUBCUTANEOUS
  Administered 2017-09-28: 1 [IU] via SUBCUTANEOUS
  Administered 2017-09-28: 2 [IU] via SUBCUTANEOUS

## 2017-09-25 MED ORDER — SODIUM CHLORIDE 0.9% IV SOLUTION: Freq: Once | INTRAVENOUS | Status: DC

## 2017-09-25 NOTE — Progress Notes (Signed)
PROGRESS NOTE  Jodi Bell ZOX:[id number] DOB: 1960-08-03 DOA: 09/20/2017 PCP: [facility]  HPI/Recap of past 65 hours: 57 year old female with medical history significant for ESRD, psoriatic arthritis, asthma/COPD, chronic back pain, DM type II, hypertension, history of Candida esophagitis, presents to the ER complaining of about 3 episodes of BRBPR, with an additional 3 episodes while in the ED X 1 day.  Complained of associated left lower quadrant discomfort, with some fatigue.  Patient denies any regular NSAID use, or any prior episodes.  Patient does report frequent dark stools but is on iron supplementation.  In the ED, patient was noted to have a hemoglobin of 6.3, baseline around 8.5, creatinine 6.06, baseline around 5.  GI consulted.  Patient admitted for further management.  Today, feeling hungry, had another episode of BRBPR yesterday.  No new abdominal pain.  No fever no chills.  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 GI on board-underwent EGD colonoscopy.  EGD was normal.  Colonoscopy showed multiple ulcers. H&H appropriately elevated after transfusion. On 09/24/2017 hemoglobin was 7.7, repeat on 816 hemoglobin six-point 2:03 PRBC hemoglobin came up to 10.6. GI recommend to continue conservative management, continue current diet and may require a repeat colonoscopy. We will recheck tomorrow.  Acute drop in hgb/anemia of CKD Hgb on  admission 6.3, baseline around 8.5 Likely worsened by above I suspect that the drop on 09/25/2017 is more likely secondary to lab error rather than dropping hemoglobin since the patient's H&H risen to 10.6 after only 2 PRBC from 6.3. Trend CBC  ESRD Not on HD yet, still makes urine Cr around baseline Follows with nephrology: Pt has been constantly advised to start HD and see vascular for access  Type 2 DM Controlled Last A1c 6.9% on 06/19/17 SSI, accuchecks, hypoglycemic protocol Hold home regimen for now  HTN Stable Continue norvasc, clonidine Hold hydralazine given GI bleed Monitor closely  COPD/asthma Stable Nebulizer as needed Supplemental oxygen as needed Continue Flonase, singular  GERD Continue PPI  Psoriatic arthritis Continue Enbrel injections  Chronic back pain Continue home oxycodone as needed  Tobacco abuse Advised to quit Nicotine patch offered  Code Status: Full  Family Communication: None at bedside  Disposition Plan: Home once stable   Consultants:  GI  Procedures:  None  Antimicrobials:  None  DVT prophylaxis: SCDs   Objective: Vitals:   09/25/17 1108 09/25/17 1203 09/25/17 1219 09/25/17 1355  BP: (!) 156/88 (!) 145/89 (!) 166/90 (!) 158/89  Pulse: 80 80 83 74  Resp: 16 16 18 16   Temp: 98.5 F (36.9 C) 98.2 F (36.8 C) 98.4 F (36.9 C) 98 F (36.7 C)  TempSrc: Oral Oral Oral Oral  SpO2: 100% 100% 100% 100%  Weight:      Height:        Intake/Output Summary (Last 24 hours) at 09/25/2017 1645 Last data filed at 09/25/2017 1354 Gross per 24 hour  Intake 1334.07 ml  Output  677 ml  Net 657.07 ml   Filed Weights   09/20/17 0805 09/23/17 0929  Weight: 80.7 kg 80.7 kg    Exam:   General: NAD  Cardiovascular: S1, S2 present  Respiratory: CTA B  Abdomen: Soft, nontender, nondistended, bowel sounds present  Musculoskeletal: No pedal edema bilaterally  Skin: Normal  Psychiatry: Normal mood   Data  Reviewed: CBC: Recent Labs  Lab 09/21/17 0333  09/22/17 0352  09/23/17 0311 09/23/17 0848 09/24/17 0330 09/24/17 1519 09/25/17 0533 09/25/17 1459  WBC 7.8  --  7.4   < > 8.0 7.9 7.1 7.9 8.2  --   NEUTROABS 5.0  --  4.4  --  4.4  --  4.4  --  4.8  --   HGB 7.2*   < > 8.4*   < > 6.7* 8.5* 7.2* 7.7* 6.3* 10.6*  HCT 21.8*   < > 25.0*   < > 20.0* 24.9* 21.2* 22.3* 18.7* 30.7*  MCV 89.0  --  86.8   < > 88.5 88.6 89.8 89.2 89.0  --   PLT 205  --  214   < > 203 190 176 199 186  --    < > = values in this interval not displayed.   Basic Metabolic Panel: Recent Labs  Lab 09/21/17 0333 09/22/17 0352 09/23/17 0311 09/24/17 0330 09/25/17 0533  NA 143 137 141 140 138  K 3.6 3.9 4.3 4.7 4.1  CL 115* 109 112* 114* 112*  CO2 22 20* 22 21* 21*  GLUCOSE 93 128* 111* 140* 118*  BUN 53* 49* 45* 41* 41*  CREATININE 5.14* 4.69* 4.75* 4.59* 4.40*  CALCIUM 8.9 9.2 8.7* 8.9 8.8*   GFR: Estimated Creatinine Clearance: 13.9 mL/min (A) (by C-G formula based on SCr of 4.4 mg/dL (H)). Liver Function Tests: Recent Labs  Lab 09/20/17 0818 09/21/17 0333  AST 14* 10*  ALT 8 9  ALKPHOS 68 50  BILITOT 0.4 0.4  PROT 6.5 5.2*  ALBUMIN 2.8* 2.3*   No results for input(s): LIPASE, AMYLASE in the last 168 hours. No results for input(s): AMMONIA in the last 168 hours. Coagulation Profile: No results for input(s): INR, PROTIME in the last 168 hours. Cardiac Enzymes: No results for input(s): CKTOTAL, CKMB, CKMBINDEX, TROPONINI in the last 168 hours. BNP (last 3 results) No results for input(s): PROBNP in the last 8760 hours. HbA1C: No results for input(s): HGBA1C in the last 72 hours. CBG: Recent Labs  Lab 09/24/17 2000 09/24/17 2353 09/25/17 0409 09/25/17 0741 09/25/17 1235  GLUCAP 104* 129* 116* 98 114*   Lipid Profile: No results for input(s): CHOL, HDL, LDLCALC, TRIG, CHOLHDL, LDLDIRECT in the last 72 hours. Thyroid Function Tests: No results for input(s): TSH, T4TOTAL, FREET4,  T3FREE, THYROIDAB in the last 72 hours. Anemia Panel: No results for input(s): VITAMINB12, FOLATE, FERRITIN, TIBC, IRON, RETICCTPCT in the last 72 hours. Urine analysis:    Component Value Date/Time   COLORURINE YELLOW 08/17/2015 1621   APPEARANCEUR CLEAR 08/17/2015 1621   LABSPEC 1.015 08/17/2015 1621   PHURINE 5.5 08/17/2015 1621   GLUCOSEU NEGATIVE 08/17/2015 1621   HGBUR NEGATIVE 08/17/2015 1621   BILIRUBINUR NEGATIVE 08/17/2015 1621   KETONESUR NEGATIVE 08/17/2015 1621   PROTEINUR 100 (A) 08/17/2015 1621   UROBILINOGEN 0.2 09/05/2014 0013   NITRITE NEGATIVE 08/17/2015 1621   LEUKOCYTESUR NEGATIVE 08/17/2015 1621   Sepsis Labs: @LABRCNTIP (procalcitonin:4,lacticidven:4)  ) Recent Results (from the past 240 hour(s))  MRSA PCR Screening     Status: None  Collection Time: 09/20/17 11:17 AM  Result Value Ref Range Status   MRSA by PCR NEGATIVE NEGATIVE Final    Comment:        The GeneXpert MRSA Assay (FDA approved for NASAL specimens only), is one component of a comprehensive MRSA colonization surveillance program. It is not intended to diagnose MRSA infection nor to guide or monitor treatment for MRSA infections. Performed at [facility], [place] [address] [place], [place] [address]       Studies: No results found.  Scheduled Meds: . sodium chloride   Intravenous Once  . amLODipine  10 mg Oral Daily  . bisoprolol  20 mg Oral Daily  . cloNIDine  0.2 mg Oral BID  . fluticasone  1 spray Each Nare Daily  . furosemide  40 mg Oral Daily  . insulin aspart  0-9 Units Subcutaneous Q4H  . loratadine  10 mg Oral Daily  . montelukast  10 mg Oral QHS  . predniSONE  2.5 mg Oral Q breakfast  . sodium chloride flush  3 mL Intravenous Q12H  . zolpidem  5 mg Oral QHS    Continuous Infusions: . sodium chloride 250 mL (09/25/17 0855)  . famotidine (PEPCID) IV Stopped (09/24/17 2351)     LOS: 5 days     Berle Mull, MD [facility]   If  7PM-7AM, please contact night-coverage [email or web address] Password [facility] 09/25/2017, 4:45 PM

## 2017-09-25 NOTE — Plan of Care (Signed)
Plan of care discussed.   

## 2017-09-25 NOTE — Progress Notes (Signed)
[facility] Progress Note  Subjective: The patient had some recurrent rectal bleeding yesterday at 4 PM. She states it was darker blood. She states it was about 100 mL. Repeat hemoglobin this morning dropped to 6.3. Patient being transfused.  Biopsies of ulcerated area in: Suggest ischemia  Objective: Vital signs in last 24 hours: Temp:  [97.9 F (36.6 C)-98.5 F (36.9 C)] 98.5 F (36.9 C) (08/16 1108) Pulse Rate:  [69-80] 80 (08/16 1108) Resp:  [16-18] 16 (08/16 1108) BP: (123-156)/(77-88) 156/88 (08/16 1108) SpO2:  [100 %] 100 % (08/16 1108) Weight change:    PE:  No distress  Abdomen is soft there is some tenderness which is mild in the left upper quadrant.  Lab Results: Results for orders placed or performed during the hospital encounter of 09/20/17 (from the past 24 hour(s))  Glucose, capillary     Status: Abnormal   Collection Time: 09/24/17 11:47 AM  Result Value Ref Range   Glucose-Capillary 115 (H) 70 - 99 mg/dL  CBC     Status: Abnormal   Collection Time: 09/24/17  3:19 PM  Result Value Ref Range   WBC 7.9 4.0 - 10.5 K/uL   RBC 2.50 (L) 3.87 - 5.11 MIL/uL   Hemoglobin 7.7 (L) 12.0 - 15.0 g/dL   HCT 22.3 (L) 36.0 - 46.0 %   MCV 89.2 78.0 - 100.0 fL   MCH 30.8 26.0 - 34.0 pg   MCHC 34.5 30.0 - 36.0 g/dL   RDW 15.9 (H) 11.5 - 15.5 %   Platelets 199 150 - 400 K/uL  Glucose, capillary     Status: Abnormal   Collection Time: 09/24/17  4:16 PM  Result Value Ref Range   Glucose-Capillary 149 (H) 70 - 99 mg/dL  Glucose, capillary     Status: Abnormal   Collection Time: 09/24/17  8:00 PM  Result Value Ref Range   Glucose-Capillary 104 (H) 70 - 99 mg/dL   Comment 1 Notify RN   Glucose, capillary     Status: Abnormal   Collection Time: 09/24/17 11:53 PM  Result Value Ref Range   Glucose-Capillary 129 (H) 70 - 99 mg/dL   Comment 1 Notify RN   Glucose, capillary     Status: Abnormal   Collection Time: 09/25/17  4:09 AM  Result Value Ref Range   Glucose-Capillary 116 (H) 70 - 99 mg/dL   Comment 1 Notify RN   CBC WITH DIFFERENTIAL     Status: Abnormal   Collection Time: 09/25/17  5:33 AM  Result Value Ref Range   WBC 8.2 4.0 - 10.5 K/uL   RBC 2.10 (L) 3.87 - 5.11 MIL/uL   Hemoglobin 6.3 (LL) 12.0 - 15.0 g/dL   HCT 18.7 (L) 36.0 - 46.0 %   MCV 89.0 78.0 - 100.0 fL   MCH 30.0 26.0 - 34.0 pg   MCHC 33.7 30.0 - 36.0 g/dL   RDW 15.9 (H) 11.5 - 15.5 %   Platelets 186 150 - 400 K/uL   Neutrophils Relative % 59 %   Neutro Abs 4.8 1.7 - 7.7 K/uL   Lymphocytes Relative 33 %   Lymphs Abs 2.8 0.7 - 4.0 K/uL   Monocytes Relative 5 %   Monocytes Absolute 0.4 0.1 - 1.0 K/uL   Eosinophils Relative 3 %   Eosinophils Absolute 0.3 0.0 - 0.7 K/uL   Basophils Relative 0 %   Basophils Absolute 0.0 0.0 - 0.1 K/uL  Basic metabolic panel     Status: Abnormal  Collection Time: 09/25/17  5:33 AM  Result Value Ref Range   Sodium 138 135 - 145 mmol/L   Potassium 4.1 3.5 - 5.1 mmol/L   Chloride 112 (H) 98 - 111 mmol/L   CO2 21 (L) 22 - 32 mmol/L   Glucose, Bld 118 (H) 70 - 99 mg/dL   BUN 41 (H) 6 - 20 mg/dL   Creatinine, Ser 4.40 (H) 0.44 - 1.00 mg/dL   Calcium 8.8 (L) 8.9 - 10.3 mg/dL   GFR calc non Af Amer 10 (L) >60 mL/min   GFR calc Af Amer 12 (L) >60 mL/min   Anion gap 5 5 - 15  Prepare RBC     Status: None   Collection Time: 09/25/17  6:23 AM  Result Value Ref Range   Order Confirmation      ORDER PROCESSED BY BLOOD BANK Performed at [facility] [address], [place], [place] [address]   Type and screen [place]     Status: None (Preliminary result)   Collection Time: 09/25/17  6:30 AM  Result Value Ref Range   ABO/RH(D) AB POS    Antibody Screen NEG    Sample Expiration 09/28/2017    Unit Number [id number]    Blood Component Type RBC, LR IRR    Unit division 00    Status of Unit ISSUED    Transfusion Status OK TO TRANSFUSE    Crossmatch Result      Compatible Performed at  [facility], [place] [address] [place], [place] [address]    Unit Number [id number]    Blood Component Type RED CELLS,LR    Unit division 00    Status of Unit ALLOCATED    Transfusion Status OK TO TRANSFUSE    Crossmatch Result Compatible   Glucose, capillary     Status: None   Collection Time: 09/25/17  7:41 AM  Result Value Ref Range   Glucose-Capillary 98 70 - 99 mg/dL    Studies/Results: No results found.    Assessment: Lower GI bleed secondary to ischemic ulcers in transverse colon. Status post colonoscopy with clipping  Plan:   Continue to monitor. If she bleeds further we will have to do another colonoscopy.    Cassell Clement 09/25/2017, 11:36 AM  Pager: [phone] If no answer or after 5 PM call [phone]

## 2017-09-25 NOTE — Progress Notes (Signed)
CRITICAL VALUE ALERT  Critical Value:  hgb 6.3  Date & Time Notied:  09/25/17 0602  Provider Notified: Silas Sacramento    Orders Received/Actions taken: NP on call paged

## 2017-09-26 ENCOUNTER — Encounter (HOSPITAL_COMMUNITY): Payer: Self-pay

## 2017-09-26 LAB — BASIC METABOLIC PANEL
Anion gap: 5 (ref 5–15)
BUN: 40 mg/dL — ABNORMAL HIGH (ref 6–20)
CO2: 21 mmol/L — ABNORMAL LOW (ref 22–32)
Calcium: 8.9 mg/dL (ref 8.9–10.3)
Chloride: 113 mmol/L — ABNORMAL HIGH (ref 98–111)
Creatinine, Ser: 4.29 mg/dL — ABNORMAL HIGH (ref 0.44–1.00)
GFR calc Af Amer: 12 mL/min — ABNORMAL LOW (ref >60)
GFR calc non Af Amer: 11 mL/min — ABNORMAL LOW (ref >60)
Glucose, Bld: 105 mg/dL — ABNORMAL HIGH (ref 70–99)
Potassium: 3.8 mmol/L (ref 3.5–5.1)
Sodium: 139 mmol/L (ref 135–145)

## 2017-09-26 LAB — GLUCOSE, CAPILLARY
Glucose-Capillary: 82 mg/dL (ref 70–99)
Glucose-Capillary: 137 mg/dL — ABNORMAL HIGH (ref 70–99)
Glucose-Capillary: 132 mg/dL — ABNORMAL HIGH (ref 70–99)
Glucose-Capillary: 105 mg/dL — ABNORMAL HIGH (ref 70–99)

## 2017-09-26 LAB — BPAM RBC
Blood Product Expiration Date: 201908292359
Blood Product Expiration Date: 201909062359
ISSUE DATE / TIME: 201908160906
ISSUE DATE / TIME: 201908161152
Unit Type and Rh: 6200
Unit Type and Rh: 6200

## 2017-09-26 LAB — CBC WITH DIFFERENTIAL/PLATELET
Basophils Absolute: 0 10*3/uL (ref 0.0–0.1)
Basophils Relative: 0 %
Eosinophils Absolute: 0.3 10*3/uL (ref 0.0–0.7)
Eosinophils Relative: 4 %
HCT: 27 % — ABNORMAL LOW (ref 36.0–46.0)
Hemoglobin: 9.5 g/dL — ABNORMAL LOW (ref 12.0–15.0)
Lymphocytes Relative: 32 %
Lymphs Abs: 2.6 10*3/uL (ref 0.7–4.0)
MCH: 30.8 pg (ref 26.0–34.0)
MCHC: 35.2 g/dL (ref 30.0–36.0)
MCV: 87.7 fL (ref 78.0–100.0)
Monocytes Absolute: 0.4 10*3/uL (ref 0.1–1.0)
Monocytes Relative: 5 %
Neutro Abs: 4.9 10*3/uL (ref 1.7–7.7)
Neutrophils Relative %: 59 %
Platelets: 200 10*3/uL (ref 150–400)
RBC: 3.08 MIL/uL — ABNORMAL LOW (ref 3.87–5.11)
RDW: 15 % (ref 11.5–15.5)
WBC: 8.3 10*3/uL (ref 4.0–10.5)

## 2017-09-26 LAB — TYPE AND SCREEN
ABO/RH(D): AB POS
Antibody Screen: NEGATIVE
Unit division: 0
Unit division: 0

## 2017-09-26 NOTE — Progress Notes (Signed)
H and H 9.5/27. Had a formed dark stool which I suspect is old blood. She saw a little red but only when she wiped. Feels fine otherwise. IMP: Ischemic ulcers in colon as seen on colonoscopy. S/P clipping. Plan. Advance diet and observe.

## 2017-09-26 NOTE — Progress Notes (Signed)
PROGRESS NOTE  Jodi Bell TDD:[id number] DOB: 01/19/1961 DOA: 09/20/2017 [facility]: [facility]  HPI/Recap of past 57 hours: 57 year old female with medical history significant for ESRD, psoriatic arthritis, asthma/COPD, chronic back pain, DM type II, hypertension, history of Candida esophagitis, presents to the ER complaining of about 3 episodes of BRBPR, with an additional 3 episodes while in the ED X 1 day.  Complained of associated left lower quadrant discomfort, with some fatigue.  Patient denies any regular NSAID use, or any prior episodes.  Patient does report frequent dark stools but is on iron supplementation.  In the ED, patient was noted to have a hemoglobin of 6.3, baseline around 8.5, creatinine 6.06, baseline around 5.  GI consulted.  Patient admitted for further management.  Today, no acute complaint.  Actually had a dark BM.  No blood in the stool.  No nausea no vomiting.  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 GI on board-underwent EGD colonoscopy.  EGD was normal.  Colonoscopy showed multiple ulcers. H&H stable GI recommend to continue conservative management, continue advance diet and monitor H&H.  Acute drop in hgb/anemia of CKD Hgb on admission 6.3, baseline around 8.5 Likely worsened by above I suspect that the drop on 09/25/2017 is more likely secondary to lab error rather than dropping hemoglobin since the patient's H&H risen to 10.6 after only 2 PRBC  from 6.3. Trend CBC  ESRD Not on HD yet, still makes urine Cr around baseline Follows with nephrology: Pt has been constantly advised to start HD and see vascular for access  Type 2 DM Controlled Last A1c 6.9% on 06/19/17 SSI, accuchecks, hypoglycemic protocol Hold home regimen for now  HTN Stable Continue norvasc, clonidine Hold hydralazine given GI bleed Monitor closely  COPD/asthma Stable Nebulizer as needed Supplemental oxygen as needed Continue Flonase, singular  GERD Continue PPI  Psoriatic arthritis Continue Enbrel injections  Chronic back pain Continue home oxycodone as needed  Tobacco abuse Advised to quit Nicotine patch offered  Code Status: Full  Family Communication: None at bedside  Disposition Plan: Home once stable, likely tomorrow   Consultants:  GI  Procedures:  None  Antimicrobials:  None  DVT prophylaxis: SCDs   Objective: Vitals:   09/25/17 1355 09/25/17 2126 09/26/17 0637 09/26/17 1418  BP: (!) 158/89 (!) 150/87 (!) 167/94 132/87  Pulse: 74 79 72 74  Resp: 16 16 18 16   Temp: 98 F (36.7 C) 98.6 F (37 C) 98.7 F (37.1 C) 98.7 F (37.1 C)  TempSrc: Oral Oral Oral Oral  SpO2: 100% 99% 100% 100%  Weight:      Height:        Intake/Output Summary (Last 24 hours) at 09/26/2017 1547 Last data filed at 09/26/2017 1427 Gross per 24 hour  Intake 2243.88 ml  Output -  Net 2243.88 ml   Filed Weights   09/20/17 0805 09/23/17 0929  Weight: 80.7 kg 80.7 kg    Exam:   General: NAD  Cardiovascular: S1, S2 present  Respiratory: CTA B  Abdomen: Soft, nontender, nondistended, bowel sounds present  Musculoskeletal: No pedal edema bilaterally  Skin: Normal  Psychiatry: Normal mood   Data Reviewed: CBC: Recent Labs  Lab 09/22/17 0352  09/23/17 0311 09/23/17 0848 09/24/17 0330 09/24/17 1519 09/25/17 0533 09/25/17 1459 09/26/17 0520  WBC 7.4   < > 8.0 7.9 7.1 7.9 8.2  --  8.3  NEUTROABS 4.4  --  4.4   --  4.4  --  4.8  --  4.9  HGB 8.4*   < > 6.7* 8.5* 7.2* 7.7* 6.3* 10.6* 9.5*  HCT 25.0*   < > 20.0* 24.9* 21.2* 22.3* 18.7* 30.7* 27.0*  MCV 86.8   < > 88.5 88.6 89.8 89.2 89.0  --  87.7  PLT 214   < > 203 190 176 199 186  --  200   < > = values in this interval not displayed.   Basic Metabolic Panel: Recent Labs  Lab 09/22/17 0352 09/23/17 0311 09/24/17 0330 09/25/17 0533 09/26/17 0520  NA 137 141 140 138 139  K 3.9 4.3 4.7 4.1 3.8  CL 109 112* 114* 112* 113*  CO2 20* 22 21* 21* 21*  GLUCOSE 128* 111* 140* 118* 105*  BUN 49* 45* 41* 41* 40*  CREATININE 4.69* 4.75* 4.59* 4.40* 4.29*  CALCIUM 9.2 8.7* 8.9 8.8* 8.9   GFR: Estimated Creatinine Clearance: 14.2 mL/min (A) (by C-G formula based on SCr of 4.29 mg/dL (H)). Liver Function Tests: Recent Labs  Lab 09/20/17 0818 09/21/17 0333  AST 14* 10*  ALT 8 9  ALKPHOS 68 50  BILITOT 0.4 0.4  PROT 6.5 5.2*  ALBUMIN 2.8* 2.3*   No results for input(s): LIPASE, AMYLASE in the last 168 hours. No results for input(s): AMMONIA in the last 168 hours. Coagulation Profile: No results for input(s): INR, PROTIME in the last 168 hours. Cardiac Enzymes: No results for input(s): CKTOTAL, CKMB, CKMBINDEX, TROPONINI in the last 168 hours. BNP (last 3 results) No results for input(s): PROBNP in the last 8760 hours. HbA1C: No results for input(s): HGBA1C in the last 72 hours. CBG: Recent Labs  Lab 09/25/17 1235 09/25/17 1708 09/25/17 2129 09/26/17 0730 09/26/17 1156  GLUCAP 114* 130* 134* 82 137*   Lipid Profile: No results for input(s): CHOL, HDL, LDLCALC, TRIG, CHOLHDL, LDLDIRECT in the last 72 hours. Thyroid Function Tests: No results for input(s): TSH, T4TOTAL, FREET4, T3FREE, THYROIDAB in the last 72 hours. Anemia Panel: No results for input(s): VITAMINB12, FOLATE, FERRITIN, TIBC, IRON, RETICCTPCT in the last 72 hours. Urine analysis:    Component Value Date/Time   COLORURINE YELLOW 08/17/2015 1621   APPEARANCEUR  CLEAR 08/17/2015 1621   LABSPEC 1.015 08/17/2015 1621   PHURINE 5.5 08/17/2015 1621   GLUCOSEU NEGATIVE 08/17/2015 1621   HGBUR NEGATIVE 08/17/2015 1621   BILIRUBINUR NEGATIVE 08/17/2015 1621   KETONESUR NEGATIVE 08/17/2015 1621   PROTEINUR 100 (A) 08/17/2015 1621   UROBILINOGEN 0.2 09/05/2014 0013   NITRITE NEGATIVE 08/17/2015 1621   LEUKOCYTESUR NEGATIVE 08/17/2015 1621   Sepsis Labs: @LABRCNTIP (procalcitonin:4,lacticidven:4)  ) Recent Results (from the past 240 hour(s))  MRSA PCR Screening     Status: None   Collection Time: 09/20/17 11:17 AM  Result Value Ref Range Status   MRSA by PCR NEGATIVE NEGATIVE Final    Comment:        The GeneXpert MRSA Assay (FDA approved for NASAL specimens only), is one component of a comprehensive MRSA colonization surveillance program.  It is not intended to diagnose MRSA infection nor to guide or monitor treatment for MRSA infections. Performed at [facility], [place] [address], [place], [place] [address]       Studies: No results found.  Scheduled Meds: . sodium chloride   Intravenous Once  . amLODipine  10 mg Oral Daily  . bisoprolol  20 mg Oral Daily  . cloNIDine  0.2 mg Oral BID  . fluticasone  1 spray Each Nare Daily  . furosemide  40 mg Oral Daily  . insulin aspart  0-9 Units Subcutaneous TID AC & HS  . loratadine  10 mg Oral Daily  . montelukast  10 mg Oral QHS  . predniSONE  2.5 mg Oral Q breakfast  . sodium chloride flush  3 mL Intravenous Q12H  . zolpidem  5 mg Oral QHS    Continuous Infusions: . sodium chloride Stopped (09/25/17 2329)  . famotidine (PEPCID) IV Stopped (09/25/17 2250)     LOS: 6 days     Berle Mull, MD [facility]   If 7PM-7AM, please contact night-coverage [email or web address] Password [facility] 09/26/2017, 3:47 PM

## 2017-09-27 LAB — CBC WITH DIFFERENTIAL/PLATELET
Basophils Absolute: 0 10*3/uL (ref 0.0–0.1)
Basophils Relative: 0 %
Eosinophils Absolute: 0.3 10*3/uL (ref 0.0–0.7)
Eosinophils Relative: 3 %
HCT: 27.2 % — ABNORMAL LOW (ref 36.0–46.0)
Hemoglobin: 9.4 g/dL — ABNORMAL LOW (ref 12.0–15.0)
Lymphocytes Relative: 31 %
Lymphs Abs: 2.8 10*3/uL (ref 0.7–4.0)
MCH: 30.9 pg (ref 26.0–34.0)
MCHC: 34.6 g/dL (ref 30.0–36.0)
MCV: 89.5 fL (ref 78.0–100.0)
Monocytes Absolute: 0.4 10*3/uL (ref 0.1–1.0)
Monocytes Relative: 5 %
Neutro Abs: 5.6 10*3/uL (ref 1.7–7.7)
Neutrophils Relative %: 61 %
Platelets: 235 10*3/uL (ref 150–400)
RBC: 3.04 MIL/uL — ABNORMAL LOW (ref 3.87–5.11)
RDW: 15.2 % (ref 11.5–15.5)
WBC: 9.1 10*3/uL (ref 4.0–10.5)

## 2017-09-27 LAB — GLUCOSE, CAPILLARY
Glucose-Capillary: 178 mg/dL — ABNORMAL HIGH (ref 70–99)
Glucose-Capillary: 86 mg/dL (ref 70–99)
Glucose-Capillary: 209 mg/dL — ABNORMAL HIGH (ref 70–99)
Glucose-Capillary: 82 mg/dL (ref 70–99)

## 2017-09-27 LAB — HEMOGLOBIN AND HEMATOCRIT, BLOOD
HCT: 29.4 % — ABNORMAL LOW (ref 36.0–46.0)
Hemoglobin: 9.9 g/dL — ABNORMAL LOW (ref 12.0–15.0)

## 2017-09-27 MED ORDER — SUCRALFATE 1 GM/10ML PO SUSP
1.0000 g | Freq: Three times a day (TID) | ORAL | Status: DC
  Administered 2017-09-27 – 2017-09-28 (×5): 1 g via ORAL
  Filled 2017-09-27 (×5): qty 10

## 2017-09-27 MED ORDER — PANTOPRAZOLE SODIUM 40 MG PO TBEC
40.0000 mg | DELAYED_RELEASE_TABLET | Freq: Two times a day (BID) | ORAL | Status: DC
  Administered 2017-09-27 – 2017-09-28 (×3): 40 mg via ORAL
  Filled 2017-09-27 (×3): qty 1

## 2017-09-27 MED ORDER — OXYCODONE HCL 5 MG PO TABS
5.0000 mg | ORAL_TABLET | Freq: Four times a day (QID) | ORAL | Status: DC | PRN
  Administered 2017-09-27: 5 mg via ORAL

## 2017-09-27 NOTE — Progress Notes (Signed)
The patient states that she feels fine. Her hemoglobin and hematocrit are stable. She did pass a little bit of blood with a bowel movement and she left it in the toilet bowl for me to see. It looked like a brownish stool with a little reddish blood in the toilet water. I think she is stable. She would like to wait another day however before going home just to make sure she has no further significant bleeding.

## 2017-09-27 NOTE — Progress Notes (Signed)
Notified PCP on call for an order to take telemetry off for showers and tests. Awaiting new orders.

## 2017-09-27 NOTE — Progress Notes (Addendum)
Patient ready to shower. PIV was covered and Telemetry was removed for shower. Will place telemetry back on after shower.

## 2017-09-27 NOTE — Progress Notes (Signed)
PROGRESS NOTE  Jodi Bell IWL:[id number] DOB: 25-Dec-1960 DOA: 09/20/2017 PCP: [facility]  HPI/Recap of past 64 hours: 57 year old female with medical history significant for ESRD, psoriatic arthritis, asthma/COPD, chronic back pain, DM type II, hypertension, history of Candida esophagitis, presents to the ER complaining of about 3 episodes of BRBPR, with an additional 3 episodes while in the ED X 1 day.  Complained of associated left lower quadrant discomfort, with some fatigue.  Patient denies any regular NSAID use, or any prior episodes.  Patient does report frequent dark stools but is on iron supplementation.  In the ED, patient was noted to have a hemoglobin of 6.3, baseline around 8.5, creatinine 6.06, baseline around 5.  GI consulted.  Patient admitted for further management.  Today, no acute complaint.  Has epigastric pain as well as BRBPR with bowel movements.  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 GI on board-underwent EGD colonoscopy.  EGD was normal.  Colonoscopy showed multiple ulcers. H&H stable GI recommend to continue conservative management, continue advance diet and monitor H&H.  H&H have remained stable. Epigastric pain will add Carafate and Protonix.  Acute drop in hgb/anemia of CKD Hgb on admission 6.3, baseline around 8.5 Likely worsened by above I suspect that the drop on 09/25/2017 is more likely secondary to lab error rather than dropping  hemoglobin since the patient's H&H risen to 10.6 after only 2 PRBC from 6.3. Trend CBC  ESRD Not on HD yet, still makes urine Cr around baseline Follows with nephrology: Pt has been constantly advised to start HD and see vascular for access  Type 2 DM Controlled Last A1c 6.9% on 06/19/17 SSI, accuchecks, hypoglycemic protocol Hold home regimen for now  HTN Stable Continue norvasc, clonidine Hold hydralazine given GI bleed Monitor closely  COPD/asthma Stable Nebulizer as needed Supplemental oxygen as needed Continue Flonase, singular  GERD Continue PPI  Psoriatic arthritis Continue Enbrel injections  Chronic back pain Continue home oxycodone as needed  Tobacco abuse Advised to quit Nicotine patch offered  Code Status: Full  Family Communication: None at bedside  Disposition Plan: Home once stable, likely tomorrow   Consultants:  GI  Procedures:  None  Antimicrobials:  None  DVT prophylaxis: SCDs   Objective: Vitals:   09/26/17 2200 09/27/17 0407 09/27/17 0900 09/27/17 1357  BP: (!) 158/92 (!) 172/92 (!) 148/91 (!) 143/76  Pulse: 83 79 72 73  Resp: 18 18  18   Temp: 99.3 F (37.4 C) 98.7 F (37.1 C)  98.2 F (36.8 C)  TempSrc: Oral Oral  Oral  SpO2: 100% 100%  100%  Weight:      Height:        Intake/Output Summary (Last 24 hours) at 09/27/2017 1424 Last data filed at 09/27/2017 1333 Gross per 24 hour  Intake 2383 ml  Output 405 ml  Net 1978 ml   Filed Weights   09/20/17 0805 09/23/17 0929  Weight: 80.7 kg 80.7 kg    Exam:  General: NAD  Cardiovascular: S1, S2 present  Respiratory: CTA B  Abdomen: Soft, nontender, nondistended, bowel sounds present  Musculoskeletal: No pedal edema bilaterally  Skin: Normal  Psychiatry: Normal mood   Data Reviewed: CBC: Recent Labs  Lab 09/23/17 0311  09/24/17 0330 09/24/17 1519 09/25/17 0533 09/25/17 1459 09/26/17 0520 09/27/17 0528 09/27/17 1001  WBC 8.0   < > 7.1 7.9  8.2  --  8.3 9.1  --   NEUTROABS 4.4  --  4.4  --  4.8  --  4.9 5.6  --   HGB 6.7*   < > 7.2* 7.7* 6.3* 10.6* 9.5* 9.4* 9.9*  HCT 20.0*   < > 21.2* 22.3* 18.7* 30.7* 27.0* 27.2* 29.4*  MCV 88.5   < > 89.8 89.2 89.0  --  87.7 89.5  --   PLT 203   < > 176 199 186  --  200 235  --    < > = values in this interval not displayed.   Basic Metabolic Panel: Recent Labs  Lab 09/22/17 0352 09/23/17 0311 09/24/17 0330 09/25/17 0533 09/26/17 0520  NA 137 141 140 138 139  K 3.9 4.3 4.7 4.1 3.8  CL 109 112* 114* 112* 113*  CO2 20* 22 21* 21* 21*  GLUCOSE 128* 111* 140* 118* 105*  BUN 49* 45* 41* 41* 40*  CREATININE 4.69* 4.75* 4.59* 4.40* 4.29*  CALCIUM 9.2 8.7* 8.9 8.8* 8.9   GFR: Estimated Creatinine Clearance: 14.2 mL/min (A) (by C-G formula based on SCr of 4.29 mg/dL (H)). Liver Function Tests: Recent Labs  Lab 09/21/17 0333  AST 10*  ALT 9  ALKPHOS 50  BILITOT 0.4  PROT 5.2*  ALBUMIN 2.3*   No results for input(s): LIPASE, AMYLASE in the last 168 hours. No results for input(s): AMMONIA in the last 168 hours. Coagulation Profile: No results for input(s): INR, PROTIME in the last 168 hours. Cardiac Enzymes: No results for input(s): CKTOTAL, CKMB, CKMBINDEX, TROPONINI in the last 168 hours. BNP (last 3 results) No results for input(s): PROBNP in the last 8760 hours. HbA1C: No results for input(s): HGBA1C in the last 72 hours. CBG: Recent Labs  Lab 09/26/17 1156 09/26/17 1612 09/26/17 2149 09/27/17 0742 09/27/17 1147  GLUCAP 137* 132* 105* 178* 86   Lipid Profile: No results for input(s): CHOL, HDL, LDLCALC, TRIG, CHOLHDL, LDLDIRECT in the last 72 hours. Thyroid Function Tests: No results for input(s): TSH, T4TOTAL, FREET4, T3FREE, THYROIDAB in the last 72 hours. Anemia Panel: No results for input(s): VITAMINB12, FOLATE, FERRITIN, TIBC, IRON, RETICCTPCT in the last 72 hours. Urine analysis:    Component Value Date/Time   COLORURINE YELLOW 08/17/2015 1621    APPEARANCEUR CLEAR 08/17/2015 1621   LABSPEC 1.015 08/17/2015 1621   PHURINE 5.5 08/17/2015 1621   GLUCOSEU NEGATIVE 08/17/2015 1621   HGBUR NEGATIVE 08/17/2015 1621   BILIRUBINUR NEGATIVE 08/17/2015 1621   KETONESUR NEGATIVE 08/17/2015 1621   PROTEINUR 100 (A) 08/17/2015 1621   UROBILINOGEN 0.2 09/05/2014 0013   NITRITE NEGATIVE 08/17/2015 1621   LEUKOCYTESUR NEGATIVE 08/17/2015 1621   Sepsis Labs: @LABRCNTIP (procalcitonin:4,lacticidven:4)  ) Recent Results (from the past 240 hour(s))  MRSA PCR Screening     Status: None   Collection Time: 09/20/17 11:17 AM  Result Value Ref Range Status   MRSA by PCR NEGATIVE NEGATIVE Final    Comment:        The GeneXpert MRSA Assay (FDA approved for NASAL specimens only), is one component of a comprehensive MRSA colonization surveillance  program. It is not intended to diagnose MRSA infection nor to guide or monitor treatment for MRSA infections. Performed at [facility], [place] [address], [place], [place] [address]       Studies: No results found.  Scheduled Meds: . sodium chloride   Intravenous Once  . amLODipine  10 mg Oral Daily  . bisoprolol  20 mg Oral Daily  . cloNIDine  0.2 mg Oral BID  . fluticasone  1 spray Each Nare Daily  . furosemide  40 mg Oral Daily  . insulin aspart  0-9 Units Subcutaneous TID AC & HS  . loratadine  10 mg Oral Daily  . montelukast  10 mg Oral QHS  . pantoprazole  40 mg Oral BID AC  . predniSONE  2.5 mg Oral Q breakfast  . sodium chloride flush  3 mL Intravenous Q12H  . sucralfate  1 g Oral TID WC & HS  . zolpidem  5 mg Oral QHS    Continuous Infusions: . sodium chloride Stopped (09/25/17 2329)     LOS: 7 days     Berle Mull, MD [facility]   If 7PM-7AM, please contact night-coverage [email or web address] Password [facility] 09/27/2017, 2:24 PM

## 2017-09-28 LAB — CBC WITH DIFFERENTIAL/PLATELET
Basophils Absolute: 0 10*3/uL (ref 0.0–0.1)
Basophils Relative: 0 %
Eosinophils Absolute: 0.3 10*3/uL (ref 0.0–0.7)
Eosinophils Relative: 3 %
HCT: 26.5 % — ABNORMAL LOW (ref 36.0–46.0)
Hemoglobin: 9.1 g/dL — ABNORMAL LOW (ref 12.0–15.0)
Lymphocytes Relative: 29 %
Lymphs Abs: 2.7 10*3/uL (ref 0.7–4.0)
MCH: 30.6 pg (ref 26.0–34.0)
MCHC: 34.3 g/dL (ref 30.0–36.0)
MCV: 89.2 fL (ref 78.0–100.0)
Monocytes Absolute: 0.6 10*3/uL (ref 0.1–1.0)
Monocytes Relative: 6 %
Neutro Abs: 5.8 10*3/uL (ref 1.7–7.7)
Neutrophils Relative %: 62 %
Platelets: 243 10*3/uL (ref 150–400)
RBC: 2.97 MIL/uL — ABNORMAL LOW (ref 3.87–5.11)
RDW: 15 % (ref 11.5–15.5)
WBC: 9.3 10*3/uL (ref 4.0–10.5)

## 2017-09-28 LAB — BASIC METABOLIC PANEL
Anion gap: 6 (ref 5–15)
BUN: 38 mg/dL — ABNORMAL HIGH (ref 6–20)
CO2: 22 mmol/L (ref 22–32)
Calcium: 8.9 mg/dL (ref 8.9–10.3)
Chloride: 112 mmol/L — ABNORMAL HIGH (ref 98–111)
Creatinine, Ser: 4.29 mg/dL — ABNORMAL HIGH (ref 0.44–1.00)
GFR calc Af Amer: 12 mL/min — ABNORMAL LOW (ref >60)
GFR calc non Af Amer: 11 mL/min — ABNORMAL LOW (ref >60)
Glucose, Bld: 129 mg/dL — ABNORMAL HIGH (ref 70–99)
Potassium: 3.5 mmol/L (ref 3.5–5.1)
Sodium: 140 mmol/L (ref 135–145)

## 2017-09-28 LAB — GLUCOSE, CAPILLARY
Glucose-Capillary: 122 mg/dL — ABNORMAL HIGH (ref 70–99)
Glucose-Capillary: 161 mg/dL — ABNORMAL HIGH (ref 70–99)

## 2017-09-28 MED ORDER — SUCRALFATE 1 GM/10ML PO SUSP: 1.0000 g | Freq: Three times a day (TID) | ORAL | 0 refills | Status: DC

## 2017-09-28 MED ORDER — NICOTINE 21 MG/24HR TD PT24: 21.0000 mg | MEDICATED_PATCH | Freq: Every day | TRANSDERMAL | 0 refills | Status: DC | PRN

## 2017-09-28 MED ORDER — LABETALOL HCL 5 MG/ML IV SOLN
10.0000 mg | Freq: Once | INTRAVENOUS | Status: AC
  Administered 2017-09-28: 10 mg via INTRAVENOUS
  Filled 2017-09-28: qty 4

## 2017-09-28 NOTE — Progress Notes (Signed)
B/P 186/87;HR 117.  PCP on call was notified. Awaiting new orders.

## 2017-09-28 NOTE — Care Management Note (Signed)
Case Management Note  Patient Details  Name: SHAQUAYLA KLIMAS MRN: [id number] Date of Birth: 21-Mar-1960  Subjective/Objective:  No further CM needs.                  Action/Plan:d/c home.   Expected Discharge Date:  09/28/17               Expected Discharge Plan:  Home/Self Care  In-House Referral:     Discharge planning Services  CM Consult  Post Acute Care Choice:    Choice offered to:     DME Arranged:    DME Agency:     HH Arranged:    HH Agency:     Status of Service:  In process, will continue to follow  If discussed at Long Length of Stay Meetings, dates discussed:    Additional Comments:  Dessa Phi, RN 09/28/2017, 11:10 AM

## 2017-09-28 NOTE — Care Management Important Message (Signed)
Important Message  Patient Details  Name: Jodi Bell MRN: [id number] Date of Birth: February 12, 1960   Medicare Important Message Given:  Yes    Kerin Salen 09/28/2017, 12:57 [place] Message  Patient Details  Name: Jodi Bell MRN: [id number] Date of Birth: 08-05-1960   Medicare Important Message Given:  Yes    Kerin Salen 09/28/2017, 12:57 PM

## 2017-09-29 NOTE — Discharge Summary (Signed)
[facility] Discharge Summary   Patient: Jodi Bell EUM:[id number]   PCP: [facility] DOB: 1960-03-02   Date of admission: 09/20/2017   Date of discharge: 09/28/2017    Discharge Diagnoses:  Principal Problem:   Hematochezia Active Problems:   Type 2 diabetes mellitus ([place])   Essential hypertension   BACK PAIN, LUMBAR, CHRONIC   Psoriasis arthropathica (HCC)   GERD (gastroesophageal reflux disease)   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 2 diabetes mellitus ([place])   COPD (chronic obstructive pulmonary disease) ([place])   Tobacco use   Symptomatic anemia   Headache   Admitted From: home Disposition:  home  Recommendations for Outpatient Follow-up:  1. please follow up with PCP in 1 week   [place]. Schedule an appointment as soon as possible for a visit in 1 week(s).   Contact information: [place] [address] [phone]        Gastroenterology, [facility]. Schedule an appointment as soon as possible for a visit in 1 month(s).   Contact information: [address] [place] [address] [phone]          Diet recommendation: soft diet  Activity: The patient is advised to gradually reintroduce usual activities.  Discharge Condition: good  Code Status: full code  History of present illness: As per the H and P dictated on admission, "Jodi Bell is a 57 y.o. female with medical history significant of ESRD, anemia, osteoarthritis and psoriatic arthritis, asthma/COPD, history of Candida esophagitis, chronic back pain, chronic insomnia, type 2 diabetes, eczema, essential hypertension, GERD, hiatal hernia, obesity, history of pneumonia who is coming to the emergency department with complaints of hematochezia since early this morning.  She had 3 episodes of bloody bowel movements at home in the morning the emergency department.  She complains of crampy left lower quadrant pain as well.  She  felt mildly lightheaded when she was boarding in the ambulance.  She feels mildly dyspneic and fatigued.She denies regular NSAIDs use.  However, she mentioned having Alka-Seltzer yesterday for headache.  She denies nausea, emesis, diarrhea, but gets frequently constipated.  She has frequent melena, but she attributes this to her iron supplementation.    She denies fever, chills, rhinorrhea, sore throat, wheezing, hemoptysis, chest pain, palpitations, diaphoresis, PND, orthopnea or recent pitting edema lower extremities.  She urinates several times a day despite ESRD.  She denies dysuria, frequency or hematuria.  No heat or cold intolerance.  No polyuria, polydipsia, polyphagia or blurred vision."  Hospital Course:  Summary of her active problems in the hospital is as following. GI bleed, likely lower Stable S/P 7U of PRBC, last 09/25/17 Reports hx of EGD in 2/19 at [facility] esophagitis, reports a normal colonoscopy in 2014 at [facility]. GI on board-underwent EGD colonoscopy.  EGD was normal. Colonoscopy showed multiple ulcers. H&H stable GI recommend to continue conservative management,  Epigastric pain will add Carafate and Protonix.  Acute drop in hgb/anemia of CKD Hgb on admission 6.3, baseline around 8.5 Likely worsened by above I suspect that the drop on 09/25/2017 is more likely secondary to lab error rather than dropping hemoglobin since the patient's H&H risen to 10.6 after only 2 PRBC from 6.3.  ESRD Not on HD yet, still makes urine Cr around baseline Follows with nephrology: Pt has been constantly advised to start HD and see vascular for access  Type 2 DM Controlled Last A1c 6.9% on 06/19/17 Resume home  regimen for now  HTN Stable Continue norvasc, clonidine Monitor closely  COPD/asthma Stable Nebulizer as needed Supplemental oxygen as needed Continue Flonase, singular  GERD Continue PPI  Psoriatic arthritis Continue Enbrel injections  Chronic back  pain Continue home oxycodone as needed  Tobacco abuse Advised to quit Nicotine patch offered  All other chronic medical condition were stable during the hospitalization.  Patient was ambulatory without any assistance. On the day of the discharge the patient's vitals were stable , and no other acute medical condition were reported by patient. the patient was felt safe to be discharge at home with family.  Consultants: gastroenterology  Procedures: colonoscopy   DISCHARGE MEDICATION: Allergies as of 09/28/2017      Reactions   Adalimumab Other (See Comments)   Blisters on abdomen =humira   Ibuprofen Other (See Comments)   Renal Problems   Leflunomide And Related Other (See Comments)   Severe Headache   Lexapro [escitalopram Oxalate] Other (See Comments)   Hand problems - Serotonin Syndrome    Lisinopril Swelling   Angioedema    Other    Cosentyx= angioedema   Pistachio Nut (diagnostic)    Mouth itches and tongue swelling Hazel nuts and pecans also   Statins Other (See Comments)   Leg pains and weakness   Trazodone And Nefazodone Other (See Comments)   Serotonin Syndrome       Medication List    TAKE these medications   allopurinol 300 MG tablet Commonly known as:  ZYLOPRIM Take 150 mg by mouth every evening.   amLODipine 10 MG tablet Commonly known as:  NORVASC Take 10 mg by mouth daily.   bisoprolol 10 MG tablet Commonly known as:  ZEBETA Take 20 mg by mouth daily. Takes at 100 pm   cetirizine 10 MG tablet Commonly known as:  ZYRTEC Take 20 mg by mouth 2 (two) times daily.   cloNIDine 0.2 MG tablet Commonly known as:  CATAPRES Take 0.2 mg by mouth 2 (two) times daily.   diclofenac sodium 1 % Gel Commonly known as:  VOLTAREN Apply 2 g topically daily as needed (joint pain).   ENBREL 50 MG/ML injection Generic drug:  etanercept Inject 50 mg into the skin once a week.   EPINEPHrine 0.3 mg/0.3 mL Soaj injection Commonly known as:  EPI-PEN Inject 0.3  mLs (0.3 mg total) into the muscle once. Notes to patient:  Use as directed for allergic reaction   esomeprazole 20 MG capsule Commonly known as:  NEXIUM Take 20 mg by mouth every evening.   ferrous sulfate 325 (65 FE) MG tablet Take 325 mg by mouth daily.   fluticasone 50 MCG/ACT nasal spray Commonly known as:  FLONASE Place 1 spray into both nostrils daily.   folic acid 409 MCG tablet Commonly known as:  FOLVITE Take 0.4 mg by mouth daily.   furosemide 40 MG tablet Commonly known as:  LASIX Take 40 mg by mouth daily.   gabapentin 100 MG capsule Commonly known as:  NEURONTIN Take 100 mg by mouth at bedtime as needed (nerve pain).   hydrALAZINE 25 MG tablet Commonly known as:  APRESOLINE Take 1 tablet by mouth 4 (four) times daily.   JANUVIA 100 MG tablet Generic drug:  sitaGLIPtin Take 100 mg by mouth daily.   montelukast 10 MG tablet Commonly known as:  SINGULAIR Take 10 mg by mouth at bedtime.   multivitamin tablet Take 1 tablet by mouth daily.   nicotine 21 mg/24hr patch Commonly known as:  NICODERM CQ - dosed in mg/24 hours Place 1 patch (21 mg total) onto the skin daily as needed (Nicotine withdrawal symptoms).   oxyCODONE 15 MG immediate release tablet Commonly known as:  ROXICODONE Take 15 mg by mouth 4 (four) times daily as needed for pain. For pain.   PERI-COLACE 8.6-50 MG tablet Generic drug:  senna-docusate Take 1 tablet by mouth daily as needed for mild constipation.   predniSONE 2.5 MG tablet Commonly known as:  DELTASONE Take 2.5 mg by mouth daily with breakfast.   PROBIOTIC DAILY PO Take 1 tablet by mouth daily.   promethazine 12.5 MG tablet Commonly known as:  PHENERGAN Take 12.5 mg by mouth daily as needed for nausea or vomiting.   ranitidine 300 MG tablet Commonly known as:  ZANTAC Take 300 mg by mouth 2 (two) times daily.   sucralfate 1 GM/10ML suspension Commonly known as:  CARAFATE Take 10 mLs (1 g total) by mouth 4 (four)  times daily -  with meals and at bedtime.   TRESIBA FLEXTOUCH 200 UNIT/ML Sopn Generic drug:  Insulin Degludec Inject 14 Units into the skin every evening.   VENTOLIN HFA 108 (90 Base) MCG/ACT inhaler Generic drug:  albuterol Inhale 1 puff into the lungs every 6 (six) hours as needed for shortness of breath. wheezing   Vitamin D (Ergocalciferol) 50000 units Caps capsule Commonly known as:  DRISDOL Take 1 capsule by mouth once a week. On Friday   zolpidem 10 MG tablet Commonly known as:  AMBIEN Take 10 mg by mouth at bedtime.      Allergies  Allergen Reactions  . Adalimumab Other (See Comments)    Blisters on abdomen =humira  . Ibuprofen Other (See Comments)    Renal Problems  . Leflunomide And Related Other (See Comments)    Severe Headache  . Lexapro [Escitalopram Oxalate] Other (See Comments)    Hand problems - Serotonin Syndrome   . Lisinopril Swelling    Angioedema   . Other     Cosentyx= angioedema   . Pistachio Nut (Diagnostic)     Mouth itches and tongue swelling Hazel nuts and pecans also  . Statins Other (See Comments)    Leg pains and weakness  . Trazodone And Nefazodone Other (See Comments)    Serotonin Syndrome    Discharge Instructions    Diet - low sodium heart healthy   Complete by:  As directed    Discharge instructions   Complete by:  As directed    It is important that you read following instructions as well as go over your medication list with RN to help you understand your care after this hospitalization.  Discharge Instructions: Please follow-up with PCP in one week  Please request your primary care physician to go over all Hospital Tests and Procedure/Radiological results at the follow up,  Please get all Hospital records sent to your PCP by signing hospital release before you go home.   do not take more than prescribed Pain, Sleep and Anxiety Medications. You were cared for by a hospitalist during your hospital stay. If you have any  questions about your discharge medications or the care you received while you were in the hospital after you are discharged, you can call the unit and ask to speak with the hospitalist on call if the hospitalist that took care of you is not available.  Once you are discharged, your primary care physician will handle any further medical issues. Please note that NO REFILLS for  any discharge medications will be authorized once you are discharged, as it is imperative that you return to your primary care physician (or establish a relationship with a primary care physician if you do not have one) for your aftercare needs so that they can reassess your need for medications and monitor your lab values. You Must read complete instructions/literature along with all the possible adverse reactions/side effects for all the Medicines you take and that have been prescribed to you. Take any new Medicines after you have completely understood and accept all the possible adverse reactions/side effects. Wear Seat belts while driving. If you have smoked or chewed Tobacco in the last 2 yrs please stop smoking and/or stop any Recreational drug use.   Increase activity slowly   Complete by:  As directed      Discharge Exam: Filed Weights   09/20/17 0805 09/23/17 0929  Weight: 80.7 kg 80.7 kg   Vitals:   09/28/17 0735 09/28/17 0840  BP: (!) 159/83   Pulse: 84   Resp:    Temp:    SpO2:  98%   General: Appear in no distress, no Rash; Oral Mucosa moist. Cardiovascular: S1 and S2 Present, no Murmur, no JVD Respiratory: Bilateral Air entry present and Clear to Auscultation, no Crackles, no wheezes Abdomen: Bowel Sound present, Soft and no tenderness Extremities: no Pedal edema, no calf tenderness Neurology: Grossly no focal neuro deficit.  The results of significant diagnostics from this hospitalization (including imaging, microbiology, ancillary and laboratory) are listed below for reference.    Significant  Diagnostic Studies: Dg Chest Port 1 View  Result Date: 09/20/2017 CLINICAL DATA:  Rectal bleeding.  Chronic kidney disease. EXAM: PORTABLE CHEST 1 VIEW COMPARISON:  05/31/2016 FINDINGS: The heart size and mediastinal contours are within normal limits. Both lungs are clear. The visualized skeletal structures are unremarkable. IMPRESSION: No active disease. Electronically Signed   By: Kathreen Devoid   On: 09/20/2017 09:19    Microbiology: Recent Results (from the past 240 hour(s))  MRSA PCR Screening     Status: None   Collection Time: 09/20/17 11:17 AM  Result Value Ref Range Status   MRSA by PCR NEGATIVE NEGATIVE Final    Comment:        The GeneXpert MRSA Assay (FDA approved for NASAL specimens only), is one component of a comprehensive MRSA colonization surveillance program. It is not intended to diagnose MRSA infection nor to guide or monitor treatment for MRSA infections. Performed at [facility], [address] [place], [place] [address]      Labs: CBC: Recent Labs  Lab 09/24/17 0330 09/24/17 1519 09/25/17 0533 09/25/17 1459 09/26/17 0520 09/27/17 0528 09/27/17 1001 09/28/17 0513  WBC 7.1 7.9 8.2  --  8.3 9.1  --  9.3  NEUTROABS 4.4  --  4.8  --  4.9 5.6  --  5.8  HGB 7.2* 7.7* 6.3* 10.6* 9.5* 9.4* 9.9* 9.1*  HCT 21.2* 22.3* 18.7* 30.7* 27.0* 27.2* 29.4* 26.5*  MCV 89.8 89.2 89.0  --  87.7 89.5  --  89.2  PLT 176 199 186  --  200 235  --  366   Basic Metabolic Panel: Recent Labs  Lab 09/23/17 0311 09/24/17 0330 09/25/17 0533 09/26/17 0520 09/28/17 0513  NA 141 140 138 139 140  K 4.3 4.7 4.1 3.8 3.5  CL 112* 114* 112* 113* 112*  CO2 22 21* 21* 21* 22  GLUCOSE 111* 140* 118* 105* 129*  BUN 45* 41* 41*  40* 38*  CREATININE 4.75* 4.59* 4.40* 4.29* 4.29*  CALCIUM 8.7* 8.9 8.8* 8.9 8.9   Liver Function Tests: No results for input(s): AST, ALT, ALKPHOS, BILITOT, PROT, ALBUMIN in the last 168 hours. No results for input(s): LIPASE,  AMYLASE in the last 168 hours. No results for input(s): AMMONIA in the last 168 hours. Cardiac Enzymes: No results for input(s): CKTOTAL, CKMB, CKMBINDEX, TROPONINI in the last 168 hours. BNP (last 3 results) No results for input(s): BNP in the last 8760 hours. CBG: Recent Labs  Lab 09/27/17 1147 09/27/17 1641 09/27/17 2122 09/28/17 0724 09/28/17 1141  GLUCAP 86 209* 82 122* 161*   Time spent: 35 minutes  Signed:  Berle Mull  [facility] 09/28/2017  , 6:41 PM

## 2017-10-21 ENCOUNTER — Encounter (HOSPITAL_COMMUNITY): Payer: Self-pay | Admitting: *Deleted

## 2017-10-28 ENCOUNTER — Other Ambulatory Visit (HOSPITAL_COMMUNITY): Payer: Self-pay | Admitting: Gastroenterology

## 2017-10-28 ENCOUNTER — Other Ambulatory Visit: Payer: Self-pay | Admitting: Gastroenterology

## 2017-10-28 DIAGNOSIS — K5900 Constipation, unspecified: Secondary | ICD-10-CM

## 2017-10-28 DIAGNOSIS — K559 Vascular disorder of intestine, unspecified: Secondary | ICD-10-CM

## 2017-10-30 NOTE — H&P (Signed)
UNICOMPARTMENTAL KNEE ADMISSION H&P  Patient is being admitted for left medial unicompartmental knee arthroplasty.  Subjective:  Chief Complaint:  Left knee medial compartmental primary OA /pain    HPI: Jodi Bell, 57 y.o. female , has a history of pain and functional disability in the left and has failed non-surgical conservative treatments for greater than 12 weeks to include NSAID's and/or analgesics, corticosteriod injections, viscosupplementation injections and activity modification.  Onset of symptoms was gradual, starting 2+ years ago with gradually worsening course since that time. The patient noted no past surgery on the left knee(s).  Patient currently rates pain in the left knee(s) at 9 out of 10 with activity. Patient has night pain, worsening of pain with activity and weight bearing, pain that interferes with activities of daily living, pain with passive range of motion, crepitus and joint swelling.  Patient has evidence of periarticular osteophytes and joint space narrowing of the medial compartment by imaging studies.  There is no active infection.  Risks, benefits and expectations were discussed with the patient.  Risks including but not limited to the risk of anesthesia, blood clots, nerve damage, blood vessel damage, failure of the prosthesis, infection and up to and including death.  Patient understand the risks, benefits and expectations and wishes to proceed with surgery.   PCP: [place]  D/C Plans:       Home   Post-op Meds:       No Rx given   Tranexamic Acid:      To be given - IV   Decadron:      Is to be given  FYI:     Lovenox 14 days (CKD 4)  Oxycodone 10-20 q 4 hrs prn (On oxycodone prior to surgery)  DME:    Rx given for - RW   PT:    HHPT to be ordered   Past Medical History:  Diagnosis Date  . Anemia   . Arthritis    oa and psoriatic ra  . Asthma   . Back pain, chronic    lower back  . Candida infection finished tx 2  days ago  . Chronic insomnia   . Chronic kidney disease    stage 3 ckd last ov nephrology 04-29-17 epic  . Chronic kidney disease (CKD) stage G4/A1, severely decreased glomerular filtration rate (GFR) between 15-29 mL/min/1.73 square meter and albuminuria creatinine ratio less than 30 mg/g (HCC)   . COPD (chronic obstructive pulmonary disease) ([place])   . Diabetes mellitus    type 2  . Eczema   . Essential hypertension   . GERD (gastroesophageal reflux disease)   . History of hiatal hernia   . Obesity   . Pneumonia few yrs ago x 2     Past Surgical History:  Procedure Laterality Date  . BACK SURGERY  2016   lower back fusion with cage  . BIOPSY  09/23/2017   Procedure: BIOPSY;  Surgeon: Wonda Horner, MD;  Location: [facility];  Service: Endoscopy;;  . CESAREAN SECTION  1990   x 1   . COLONOSCOPY WITH PROPOFOL N/A 09/23/2017   Procedure: COLONOSCOPY WITH PROPOFOL Hemostatic clips placed;  Surgeon: Wonda Horner, MD;  Location: [facility];  Service: Endoscopy;  Laterality: N/A;  . DILATION AND CURETTAGE OF UTERUS  1988  . ESOPHAGOGASTRODUODENOSCOPY (EGD) WITH PROPOFOL N/A 09/23/2017   Procedure: ESOPHAGOGASTRODUODENOSCOPY (EGD) WITH PROPOFOL;  Surgeon: Wonda Horner, MD;  Location: [facility];  Service: Endoscopy;  Laterality: N/A;  . SUBMUCOSAL INJECTION  09/23/2017   Procedure: SUBMUCOSAL INJECTION;  Surgeon: Wonda Horner, MD;  Location: [facility];  Service: Endoscopy;;  in colon  . TUBAL LIGATION  1990    Allergies  Allergen Reactions  . Adalimumab Other (See Comments)    Blisters on abdomen =humira  . Ibuprofen Other (See Comments)    Renal Problems  . Leflunomide And Related Other (See Comments)    Severe Headache  . Lexapro [Escitalopram Oxalate] Other (See Comments)    Hand problems - Serotonin Syndrome   . Lisinopril Swelling    Angioedema   . Other     Cosentyx= angioedema   . Pistachio Nut (Diagnostic)     Mouth itches and tongue swelling Hazel nuts  and pecans also  . Statins Other (See Comments)    Leg pains and weakness  . Trazodone And Nefazodone Other (See Comments)    Serotonin Syndrome     Social History   Tobacco Use  . Smoking status: Current Every Day Smoker    Packs/day: 0.50    Years: 40.00    Pack years: 20.00    Types: Cigarettes    Last attempt to quit: 02/03/2014    Years since quitting: 3.7  . Smokeless tobacco: Never Used  Substance Use Topics  . Alcohol use: Yes    Comment: occ  . Drug use: No    Family History  Problem Relation Age of Onset  . Lung cancer Mother   . Diabetes Sister   . Heart disease Sister   . Thyroid cancer Unknown        1/2 sister  . Heart disease Unknown        1/2 sister     Review of Systems  Constitutional: Negative.   HENT: Negative.   Eyes: Negative.   Respiratory: Negative.   Cardiovascular: Negative.   Gastrointestinal: Positive for constipation and heartburn.  Genitourinary: Negative.   Musculoskeletal: Positive for back pain and joint pain.  Skin: Negative.   Neurological: Negative.   Endo/Heme/Allergies: Negative.   Psychiatric/Behavioral: The patient is nervous/anxious and has insomnia.      Objective:   Physical Exam  Constitutional: She is oriented to person, place, and time. She appears well-developed.  HENT:  Head: Normocephalic.  Eyes: Pupils are equal, round, and reactive to light.  Neck: Neck supple. No JVD present. No tracheal deviation present. No thyromegaly present.  Cardiovascular: Normal rate, regular rhythm and intact distal pulses.  Respiratory: Effort normal and breath sounds normal. No respiratory distress. She has no wheezes.  GI: Soft. There is no tenderness. There is no guarding.  Musculoskeletal:       Left knee: She exhibits decreased range of motion, swelling and bony tenderness. She exhibits no ecchymosis, no deformity, no laceration and no erythema. Tenderness found. Medial joint line tenderness noted. No lateral joint  line tenderness noted.  Lymphadenopathy:    She has no cervical adenopathy.  Neurological: She is alert and oriented to person, place, and time.  Skin: Skin is warm and dry.  Psychiatric: She has a normal mood and affect.        Labs:  Estimated body mass index is 32.56 kg/m as calculated from the following:   Height as of 09/23/17: 5\' 2"  (1.575 m).   Weight as of 09/23/17: 80.7 kg.   Imaging Review Plain radiographs demonstrate severe degenerative joint disease of the left knee(s) medial compartment.  The bone quality appears to be good for  age and reported activity level.  Assessment/Plan:  End stage arthritis, left knee medial compartment  The patient history, physical examination, clinical judgment of the provider and imaging studies are consistent with end stage degenerative joint disease of the left knee(s) and medial unicompartmental knee arthroplasty is deemed medically necessary. The treatment options including medical management, injection therapy arthroscopy and arthroplasty were discussed at length. The risks and benefits of total knee arthroplasty were presented and reviewed. The risks due to aseptic loosening, infection, stiffness, patella tracking problems, thromboembolic complications and other imponderables were discussed. The patient acknowledged the explanation, agreed to proceed with the plan and consent was signed. Patient is being admitted for outpatient / observation treatment for surgery, pain control, PT, OT, prophylactic antibiotics, VTE prophylaxis, progressive ambulation and ADL's and discharge planning. The patient is planning to be discharged home with home health services.     [place] Elaisha Zahniser   PA-C  10/30/2017, 12:07 PM

## 2017-11-05 NOTE — Patient Instructions (Addendum)
Jodi Bell  11/05/2017   Your procedure is scheduled on: 11-12-17    Report to [facility] Main  Entrance    Report to Admitting at 9:45 AM    Call this number if you have problems the morning of surgery [phone]   Remember: Do not eat food or drink liquids :After Midnight.       Take these medicines the morning of surgery with A SIP OF WATER: Amlodipine (Norvasc), Cetirizine (Zyrtec), Clonidine (Catapres), Hydralazine (Apresoline), Ranitidine (Zantac), and Bisoprolol  You may also bring and use your inhaler, and nasal spray as needed.    BRUSH YOUR TEETH MORNING OF SURGERY AND RINSE YOUR MOUTH OUT, NO CHEWING GUM CANDY OR MINTS.   DO NOT TAKE ANY DIABETIC MEDICATIONS DAY OF YOUR SURGERY                               You may not have any metal on your body including hair pins and              piercings  Do not wear jewelry, make-up, lotions, powders or perfumes, deodorant             Do not wear nail polish.  Do not shave  48 hours prior to surgery.                 Do not bring valuables to the hospital. [place].  Contacts, dentures or bridgework may not be worn into surgery.  Leave suitcase in the car. After surgery it may be brought to your room.      Special Instructions: N/A              Please read over the following fact sheets you were given: _____________________________________________________________________             How to Manage Your Diabetes Before and After Surgery  Why is it important to control my blood sugar before and after surgery? . Improving blood sugar levels before and after surgery helps healing and can limit problems. . A way of improving blood sugar control is eating a healthy diet by: o  Eating less sugar and carbohydrates o  Increasing activity/exercise o  Talking with your doctor about reaching your blood sugar goals . High blood sugars (greater than 180  mg/dL) can raise your risk of infections and slow your recovery, so you will need to focus on controlling your diabetes during the weeks before surgery. . Make sure that the doctor who takes care of your diabetes knows about your planned surgery including the date and location.  How do I manage my blood sugar before surgery? . Check your blood sugar at least 4 times a day, starting 2 days before surgery, to make sure that the level is not too high or low. o Check your blood sugar the morning of your surgery when you wake up and every 2 hours until you get to the Short Stay unit. . If your blood sugar is less than 70 mg/dL, you will need to treat for low blood sugar: o Do not take insulin. o Treat a low blood sugar (less than 70 mg/dL) with  cup of clear juice (cranberry or apple), 4 glucose tablets,  OR glucose gel. o Recheck blood sugar in 15 minutes after treatment (to make sure it is greater than 70 mg/dL). If your blood sugar is not greater than 70 mg/dL on recheck, call [phone] for further instructions. . Report your blood sugar to the short stay nurse when you get to Short Stay.  . If you are admitted to the hospital after surgery: o Your blood sugar will be checked by the staff and you will probably be given insulin after surgery (instead of oral diabetes medicines) to make sure you have good blood sugar levels. o The goal for blood sugar control after surgery is 80-180 mg/dL.   WHAT DO I DO ABOUT MY DIABETES MEDICATION?  Marland Kitchen Do not take oral diabetes medicines (pills) the morning of surgery.  . THE DAY BEFORE SURGERY, take your usual dose of Januvia. However, take only 8  units of your evening dose of Tresiba  insulin.        [place] - Preparing for Surgery Before surgery, you can play an important role.  Because skin is not sterile, your skin needs to be as free of germs as possible.  You can reduce the number of germs on your skin by washing with CHG (chlorahexidine  gluconate) soap before surgery.  CHG is an antiseptic cleaner which kills germs and bonds with the skin to continue killing germs even after washing. Please DO NOT use if you have an allergy to CHG or antibacterial soaps.  If your skin becomes reddened/irritated stop using the CHG and inform your nurse when you arrive at Short Stay. Do not shave (including legs and underarms) for at least 48 hours prior to the first CHG shower.  You may shave your face/neck. Please follow these instructions carefully:  1.  Shower with CHG Soap the night before surgery and the  morning of Surgery.  2.  If you choose to wash your hair, wash your hair first as usual with your  normal  shampoo.  3.  After you shampoo, rinse your hair and body thoroughly to remove the  shampoo.                           4.  Use CHG as you would any other liquid soap.  You can apply chg directly  to the skin and wash                       Gently with a scrungie or clean washcloth.  5.  Apply the CHG Soap to your body ONLY FROM THE NECK DOWN.   Do not use on face/ open                           Wound or open sores. Avoid contact with eyes, ears mouth and genitals (private parts).                       Wash face,  Genitals (private parts) with your normal soap.             6.  Wash thoroughly, paying special attention to the area where your surgery  will be performed.  7.  Thoroughly rinse your body with warm water from the neck down.  8.  DO NOT shower/wash with your normal soap after using and rinsing off  the CHG Soap.  9.  Pat yourself dry with a clean towel.            10.  Wear clean pajamas.            11.  Place clean sheets on your bed the night of your first shower and do not  sleep with pets. Day of Surgery : Do not apply any lotions/deodorants the morning of surgery.  Please wear clean clothes to the hospital/surgery center.  FAILURE TO FOLLOW THESE INSTRUCTIONS MAY RESULT IN THE CANCELLATION OF YOUR  SURGERY PATIENT SIGNATURE_________________________________  NURSE SIGNATURE__________________________________  ________________________________________________________________________   [name]  An incentive spirometer is a tool that can help keep your lungs clear and active. This tool measures how well you are filling your lungs with each breath. Taking long deep breaths may help reverse or decrease the chance of developing breathing (pulmonary) problems (especially infection) following:  A long period of time when you are unable to move or be active. BEFORE THE PROCEDURE   If the spirometer includes an indicator to show your best effort, your nurse or respiratory therapist will set it to a desired goal.  If possible, sit up straight or lean slightly forward. Try not to slouch.  Hold the incentive spirometer in an upright position. INSTRUCTIONS FOR USE  1. Sit on the edge of your bed if possible, or sit up as far as you can in bed or on a chair. 2. Hold the incentive spirometer in an upright position. 3. Breathe out normally. 4. Place the mouthpiece in your mouth and seal your lips tightly around it. 5. Breathe in slowly and as deeply as possible, raising the piston or the ball toward the top of the column. 6. Hold your breath for 3-5 seconds or for as long as possible. Allow the piston or ball to fall to the bottom of the column. 7. Remove the mouthpiece from your mouth and breathe out normally. 8. Rest for a few seconds and repeat Steps 1 through 7 at least 10 times every 1-2 hours when you are awake. Take your time and take a few normal breaths between deep breaths. 9. The spirometer may include an indicator to show your best effort. Use the indicator as a goal to work toward during each repetition. 10. After each set of 10 deep breaths, practice coughing to be sure your lungs are clear. If you have an incision (the cut made at the time of surgery), support your  incision when coughing by placing a pillow or rolled up towels firmly against it. Once you are able to get out of bed, walk around indoors and cough well. You may stop using the incentive spirometer when instructed by your caregiver.  RISKS AND COMPLICATIONS  Take your time so you do not get dizzy or light-headed.  If you are in pain, you may need to take or ask for pain medication before doing incentive spirometry. It is harder to take a deep breath if you are having pain. AFTER USE  Rest and breathe slowly and easily.  It can be helpful to keep track of a log of your progress. Your caregiver can provide you with a simple table to help with this. If you are using the spirometer at home, follow these instructions: [place] IF:   You are having difficultly using the spirometer.  You have trouble using the spirometer as often as instructed.  Your pain medication is not giving enough relief while using the spirometer.  You  develop fever of 100.5 F (38.1 C) or higher. SEEK IMMEDIATE MEDICAL CARE IF:   You cough up bloody sputum that had not been present before.  You develop fever of 102 F (38.9 C) or greater.  You develop worsening pain at or near the incision site. MAKE SURE YOU:   Understand these instructions.  Will watch your condition.  Will get help right away if you are not doing well or get worse. Document Released: 06/09/2006 Document Revised: 04/21/2011 Document Reviewed: 08/10/2006 Exit[employer] Patient Information 2014 [employer], [place].   ________________________________________________________________________  WHAT IS A BLOOD TRANSFUSION? Blood Transfusion Information  A transfusion is the replacement of blood or some of its parts. Blood is made up of multiple cells which provide different functions.  Red blood cells carry oxygen and are used for blood loss replacement.  White blood cells fight against infection.  Platelets control bleeding.  Plasma  helps clot blood.  Other blood products are available for specialized needs, such as hemophilia or other clotting disorders. BEFORE THE TRANSFUSION  Who gives blood for transfusions?   Healthy volunteers who are fully evaluated to make sure their blood is safe. This is blood bank blood. Transfusion therapy is the safest it has ever been in the practice of medicine. Before blood is taken from a donor, a complete history is taken to make sure that person has no history of diseases nor engages in risky social behavior (examples are intravenous drug use or sexual activity with multiple partners). The donor's travel history is screened to minimize risk of transmitting infections, such as malaria. The donated blood is tested for signs of infectious diseases, such as HIV and hepatitis. The blood is then tested to be sure it is compatible with you in order to minimize the chance of a transfusion reaction. If you or a relative donates blood, this is often done in anticipation of surgery and is not appropriate for emergency situations. It takes many days to process the donated blood. RISKS AND COMPLICATIONS Although transfusion therapy is very safe and saves many lives, the main dangers of transfusion include:   Getting an infectious disease.  Developing a transfusion reaction. This is an allergic reaction to something in the blood you were given. Every precaution is taken to prevent this. The decision to have a blood transfusion has been considered carefully by your caregiver before blood is given. Blood is not given unless the benefits outweigh the risks. AFTER THE TRANSFUSION  Right after receiving a blood transfusion, you will usually feel much better and more energetic. This is especially true if your red blood cells have gotten low (anemic). The transfusion raises the level of the red blood cells which carry oxygen, and this usually causes an energy increase.  The nurse administering the transfusion  will monitor you carefully for complications. HOME CARE INSTRUCTIONS  No special instructions are needed after a transfusion. You may find your energy is better. Speak with your caregiver about any limitations on activity for underlying diseases you may have. SEEK MEDICAL CARE IF:   Your condition is not improving after your transfusion.  You develop redness or irritation at the intravenous (IV) site. SEEK IMMEDIATE MEDICAL CARE IF:  Any of the following symptoms occur over the next 12 hours:  Shaking chills.  You have a temperature by mouth above 102 F (38.9 C), not controlled by medicine.  Chest, back, or muscle pain.  People around you feel you are not acting correctly or are confused.  Shortness of  breath or difficulty breathing.  Dizziness and fainting.  You get a rash or develop hives.  You have a decrease in urine output.  Your urine turns a dark color or changes to pink, red, or brown. Any of the following symptoms occur over the next 10 days:  You have a temperature by mouth above 102 F (38.9 C), not controlled by medicine.  Shortness of breath.  Weakness after normal activity.  The white part of the eye turns yellow (jaundice).  You have a decrease in the amount of urine or are urinating less often.  Your urine turns a dark color or changes to pink, red, or brown. Document Released: 01/25/2000 Document Revised: 04/21/2011 Document Reviewed: 09/13/2007 [facility] Patient Information 2014 [place], [place].  _______________________________________________________________________

## 2017-11-05 NOTE — Progress Notes (Addendum)
10-19-17 Surgical Clearance from Dr. Neta Ehlers, Nephrologist on chart  09-20-17 (Epic) CXR  06-19-17 (Epic) EKG

## 2017-11-06 ENCOUNTER — Encounter (HOSPITAL_COMMUNITY)
Admission: RE | Admit: 2017-11-06 | Discharge: 2017-11-06 | Disposition: A | Discharge status: Home / Self Care | Payer: Medicare PPO | Source: Ambulatory Visit | Attending: Orthopedic Surgery | Admitting: Orthopedic Surgery

## 2017-11-06 ENCOUNTER — Other Ambulatory Visit: Payer: Self-pay

## 2017-11-06 ENCOUNTER — Encounter (HOSPITAL_COMMUNITY): Payer: Self-pay

## 2017-11-06 DIAGNOSIS — M1712 Unilateral primary osteoarthritis, left knee: Secondary | ICD-10-CM | POA: Insufficient documentation

## 2017-11-06 DIAGNOSIS — Z01812 Encounter for preprocedural laboratory examination: Secondary | ICD-10-CM | POA: Diagnosis not present

## 2017-11-06 LAB — CBC
HCT: 28.6 % — ABNORMAL LOW (ref 36.0–46.0)
Hemoglobin: 9.1 g/dL — ABNORMAL LOW (ref 12.0–15.0)
MCH: 30.4 pg (ref 26.0–34.0)
MCHC: 31.8 g/dL (ref 30.0–36.0)
MCV: 95.7 fL (ref 78.0–100.0)
Platelets: 266 10*3/uL (ref 150–400)
RBC: 2.99 MIL/uL — ABNORMAL LOW (ref 3.87–5.11)
RDW: 15.1 % (ref 11.5–15.5)
WBC: 7.9 10*3/uL (ref 4.0–10.5)

## 2017-11-06 LAB — BASIC METABOLIC PANEL
Anion gap: 6 (ref 5–15)
BUN: 50 mg/dL — ABNORMAL HIGH (ref 6–20)
CO2: 23 mmol/L (ref 22–32)
Calcium: 9.6 mg/dL (ref 8.9–10.3)
Chloride: 113 mmol/L — ABNORMAL HIGH (ref 98–111)
Creatinine, Ser: 5.85 mg/dL — ABNORMAL HIGH (ref 0.44–1.00)
GFR calc Af Amer: 8 mL/min — ABNORMAL LOW (ref >60)
GFR calc non Af Amer: 7 mL/min — ABNORMAL LOW (ref >60)
Glucose, Bld: 93 mg/dL (ref 70–99)
Potassium: 5.1 mmol/L (ref 3.5–5.1)
Sodium: 142 mmol/L (ref 135–145)

## 2017-11-06 LAB — GLUCOSE, CAPILLARY: Glucose-Capillary: 95 mg/dL (ref 70–99)

## 2017-11-06 LAB — SURGICAL PCR SCREEN
MRSA, PCR: NEGATIVE
Staphylococcus aureus: NEGATIVE

## 2017-11-06 LAB — HEMOGLOBIN A1C
Hgb A1c MFr Bld: 5.7 % — ABNORMAL HIGH (ref 4.8–5.6)
Mean Plasma Glucose: 116.89 mg/dL

## 2017-11-06 NOTE — Progress Notes (Signed)
11-06-17 BMP result routed to Dr. Alvan Dame for review

## 2017-11-09 ENCOUNTER — Ambulatory Visit (HOSPITAL_COMMUNITY)
Admission: RE | Admit: 2017-11-09 | Discharge: 2017-11-09 | Disposition: A | Discharge status: Home / Self Care | Payer: Medicare PPO | Source: Ambulatory Visit | Attending: Gastroenterology | Admitting: Gastroenterology

## 2017-11-09 DIAGNOSIS — N281 Cyst of kidney, acquired: Secondary | ICD-10-CM | POA: Diagnosis not present

## 2017-11-09 DIAGNOSIS — Z981 Arthrodesis status: Secondary | ICD-10-CM | POA: Diagnosis not present

## 2017-11-09 DIAGNOSIS — K559 Vascular disorder of intestine, unspecified: Secondary | ICD-10-CM | POA: Insufficient documentation

## 2017-11-09 DIAGNOSIS — K5900 Constipation, unspecified: Secondary | ICD-10-CM | POA: Diagnosis present

## 2017-11-12 ENCOUNTER — Encounter (HOSPITAL_COMMUNITY): Payer: Self-pay | Admitting: Anesthesiology

## 2017-11-12 ENCOUNTER — Observation Stay (HOSPITAL_COMMUNITY)
Admission: RE | Admit: 2017-11-12 | Discharge: 2017-11-13 | Disposition: A | Discharge status: Home / Self Care | Payer: Medicare PPO | Source: Ambulatory Visit | Attending: Orthopedic Surgery | Admitting: Orthopedic Surgery

## 2017-11-12 ENCOUNTER — Other Ambulatory Visit: Payer: Self-pay

## 2017-11-12 ENCOUNTER — Encounter (HOSPITAL_COMMUNITY)
Admission: RE | Disposition: A | Discharge status: Home / Self Care | Payer: Self-pay | Source: Ambulatory Visit | Attending: Orthopedic Surgery

## 2017-11-12 ENCOUNTER — Ambulatory Visit (HOSPITAL_COMMUNITY): Payer: Medicare PPO | Admitting: Anesthesiology

## 2017-11-12 DIAGNOSIS — K219 Gastro-esophageal reflux disease without esophagitis: Secondary | ICD-10-CM | POA: Insufficient documentation

## 2017-11-12 DIAGNOSIS — E669 Obesity, unspecified: Secondary | ICD-10-CM | POA: Diagnosis not present

## 2017-11-12 DIAGNOSIS — N184 Chronic kidney disease, stage 4 (severe): Secondary | ICD-10-CM | POA: Insufficient documentation

## 2017-11-12 DIAGNOSIS — J449 Chronic obstructive pulmonary disease, unspecified: Secondary | ICD-10-CM | POA: Diagnosis not present

## 2017-11-12 DIAGNOSIS — Z888 Allergy status to other drugs, medicaments and biological substances status: Secondary | ICD-10-CM | POA: Insufficient documentation

## 2017-11-12 DIAGNOSIS — F17210 Nicotine dependence, cigarettes, uncomplicated: Secondary | ICD-10-CM | POA: Diagnosis not present

## 2017-11-12 DIAGNOSIS — Z79899 Other long term (current) drug therapy: Secondary | ICD-10-CM | POA: Insufficient documentation

## 2017-11-12 DIAGNOSIS — D649 Anemia, unspecified: Secondary | ICD-10-CM | POA: Diagnosis not present

## 2017-11-12 DIAGNOSIS — Z7984 Long term (current) use of oral hypoglycemic drugs: Secondary | ICD-10-CM | POA: Diagnosis not present

## 2017-11-12 DIAGNOSIS — M1712 Unilateral primary osteoarthritis, left knee: Secondary | ICD-10-CM | POA: Diagnosis not present

## 2017-11-12 DIAGNOSIS — Z96652 Presence of left artificial knee joint: Secondary | ICD-10-CM

## 2017-11-12 DIAGNOSIS — Z6831 Body mass index (BMI) 31.0-31.9, adult: Secondary | ICD-10-CM | POA: Insufficient documentation

## 2017-11-12 DIAGNOSIS — E1122 Type 2 diabetes mellitus with diabetic chronic kidney disease: Secondary | ICD-10-CM | POA: Insufficient documentation

## 2017-11-12 DIAGNOSIS — Z23 Encounter for immunization: Secondary | ICD-10-CM | POA: Diagnosis not present

## 2017-11-12 DIAGNOSIS — I129 Hypertensive chronic kidney disease with stage 1 through stage 4 chronic kidney disease, or unspecified chronic kidney disease: Secondary | ICD-10-CM | POA: Diagnosis not present

## 2017-11-12 DIAGNOSIS — Z886 Allergy status to analgesic agent status: Secondary | ICD-10-CM | POA: Diagnosis not present

## 2017-11-12 HISTORY — PX: PARTIAL KNEE ARTHROPLASTY: SHX2174

## 2017-11-12 LAB — GLUCOSE, CAPILLARY
Glucose-Capillary: 116 mg/dL — ABNORMAL HIGH (ref 70–99)
Glucose-Capillary: 137 mg/dL — ABNORMAL HIGH (ref 70–99)

## 2017-11-12 LAB — BASIC METABOLIC PANEL
Anion gap: 11 (ref 5–15)
BUN: 38 mg/dL — ABNORMAL HIGH (ref 6–20)
CO2: 22 mmol/L (ref 22–32)
Calcium: 9.3 mg/dL (ref 8.9–10.3)
Chloride: 107 mmol/L (ref 98–111)
Creatinine, Ser: 5.97 mg/dL — ABNORMAL HIGH (ref 0.44–1.00)
GFR calc Af Amer: 8 mL/min — ABNORMAL LOW (ref >60)
GFR calc non Af Amer: 7 mL/min — ABNORMAL LOW (ref >60)
Glucose, Bld: 121 mg/dL — ABNORMAL HIGH (ref 70–99)
Potassium: 3.9 mmol/L (ref 3.5–5.1)
Sodium: 140 mmol/L (ref 135–145)

## 2017-11-12 LAB — TYPE AND SCREEN
ABO/RH(D): AB POS
Antibody Screen: NEGATIVE

## 2017-11-12 LAB — CBC
HCT: 25.7 % — ABNORMAL LOW (ref 36.0–46.0)
Hemoglobin: 8.2 g/dL — ABNORMAL LOW (ref 12.0–15.0)
MCH: 30.1 pg (ref 26.0–34.0)
MCHC: 31.9 g/dL (ref 30.0–36.0)
MCV: 94.5 fL (ref 78.0–100.0)
Platelets: 258 10*3/uL (ref 150–400)
RBC: 2.72 MIL/uL — ABNORMAL LOW (ref 3.87–5.11)
RDW: 15 % (ref 11.5–15.5)
WBC: 6.1 10*3/uL (ref 4.0–10.5)

## 2017-11-12 LAB — CREATININE, SERUM
Creatinine, Ser: 6.03 mg/dL — ABNORMAL HIGH (ref 0.44–1.00)
GFR calc Af Amer: 8 mL/min — ABNORMAL LOW (ref >60)
GFR calc non Af Amer: 7 mL/min — ABNORMAL LOW (ref >60)

## 2017-11-12 SURGERY — ARTHROPLASTY, KNEE, UNICOMPARTMENTAL
Anesthesia: Spinal | Laterality: Left

## 2017-11-12 MED ORDER — PROPOFOL 500 MG/50ML IV EMUL
INTRAVENOUS | Status: DC | PRN
  Administered 2017-11-12: 75 ug/kg/min via INTRAVENOUS

## 2017-11-12 MED ORDER — METOCLOPRAMIDE HCL 5 MG PO TABS: 5.0000 mg | ORAL_TABLET | Freq: Three times a day (TID) | ORAL | Status: DC | PRN

## 2017-11-12 MED ORDER — SODIUM CHLORIDE 0.9 % IJ SOLN
INTRAMUSCULAR | Status: AC
  Filled 2017-11-12: qty 50

## 2017-11-12 MED ORDER — STERILE WATER FOR IRRIGATION IR SOLN
Status: DC | PRN
  Administered 2017-11-12: 1000 mL

## 2017-11-12 MED ORDER — PROPOFOL 10 MG/ML IV BOLUS
INTRAVENOUS | Status: AC
  Filled 2017-11-12: qty 40

## 2017-11-12 MED ORDER — ENOXAPARIN SODIUM 30 MG/0.3ML ~~LOC~~ SOLN
30.0000 mg | SUBCUTANEOUS | Status: DC
  Administered 2017-11-13: 30 mg via SUBCUTANEOUS
  Filled 2017-11-12: qty 0.3

## 2017-11-12 MED ORDER — POLYETHYLENE GLYCOL 3350 17 G PO PACK: 17.0000 g | PACK | Freq: Every day | ORAL | Status: DC | PRN

## 2017-11-12 MED ORDER — METHOCARBAMOL 500 MG IVPB - SIMPLE MED
500.0000 mg | Freq: Four times a day (QID) | INTRAVENOUS | Status: DC | PRN
  Filled 2017-11-12: qty 50

## 2017-11-12 MED ORDER — OXYCODONE HCL 5 MG PO TABS: 5.0000 mg | ORAL_TABLET | ORAL | Status: DC | PRN

## 2017-11-12 MED ORDER — KETOROLAC TROMETHAMINE 30 MG/ML IJ SOLN
INTRAMUSCULAR | Status: DC | PRN
  Administered 2017-11-12: 30 mg

## 2017-11-12 MED ORDER — MIDAZOLAM HCL 2 MG/2ML IJ SOLN
1.0000 mg | INTRAMUSCULAR | Status: AC
  Administered 2017-11-12: 2 mg via INTRAVENOUS
  Filled 2017-11-12: qty 2

## 2017-11-12 MED ORDER — ROPIVACAINE HCL 7.5 MG/ML IJ SOLN
INTRAMUSCULAR | Status: DC | PRN
  Administered 2017-11-12: 20 mL via PERINEURAL

## 2017-11-12 MED ORDER — SODIUM CHLORIDE 0.9 % IV SOLN
1000.0000 mg | INTRAVENOUS | Status: AC
  Administered 2017-11-12: 1000 mg via INTRAVENOUS
  Filled 2017-11-12: qty 10

## 2017-11-12 MED ORDER — SODIUM CHLORIDE 0.9 % IV SOLN
INTRAVENOUS | Status: DC
  Administered 2017-11-12: 10:00:00 via INTRAVENOUS

## 2017-11-12 MED ORDER — SODIUM CHLORIDE 0.9 % IJ SOLN
INTRAMUSCULAR | Status: DC | PRN
  Administered 2017-11-12: 30 mL

## 2017-11-12 MED ORDER — PHENYLEPHRINE HCL 10 MG/ML IJ SOLN
INTRAMUSCULAR | Status: AC
  Filled 2017-11-12: qty 1

## 2017-11-12 MED ORDER — BUPIVACAINE HCL (PF) 0.25 % IJ SOLN
INTRAMUSCULAR | Status: AC
  Filled 2017-11-12: qty 30

## 2017-11-12 MED ORDER — OXYCODONE HCL 5 MG PO TABS
10.0000 mg | ORAL_TABLET | ORAL | Status: DC | PRN
  Administered 2017-11-12 – 2017-11-13 (×5): 20 mg via ORAL
  Filled 2017-11-12 (×3): qty 4
  Filled 2017-11-12: qty 2
  Filled 2017-11-12: qty 4
  Filled 2017-11-12: qty 2

## 2017-11-12 MED ORDER — ONDANSETRON HCL 4 MG/2ML IJ SOLN: 4.0000 mg | Freq: Four times a day (QID) | INTRAMUSCULAR | Status: DC | PRN

## 2017-11-12 MED ORDER — METOCLOPRAMIDE HCL 5 MG/ML IJ SOLN: 5.0000 mg | Freq: Three times a day (TID) | INTRAMUSCULAR | Status: DC | PRN

## 2017-11-12 MED ORDER — HYDROMORPHONE HCL 1 MG/ML IJ SOLN: 0.2500 mg | INTRAMUSCULAR | Status: DC | PRN

## 2017-11-12 MED ORDER — ACETAMINOPHEN 500 MG PO TABS
1000.0000 mg | ORAL_TABLET | Freq: Four times a day (QID) | ORAL | Status: DC
  Administered 2017-11-12 – 2017-11-13 (×3): 1000 mg via ORAL
  Filled 2017-11-12 (×3): qty 2

## 2017-11-12 MED ORDER — CEFAZOLIN SODIUM-DEXTROSE 1-4 GM/50ML-% IV SOLN
1.0000 g | Freq: Four times a day (QID) | INTRAVENOUS | Status: AC
  Administered 2017-11-12 – 2017-11-13 (×2): 1 g via INTRAVENOUS
  Filled 2017-11-12 (×2): qty 50

## 2017-11-12 MED ORDER — KETOROLAC TROMETHAMINE 30 MG/ML IJ SOLN
INTRAMUSCULAR | Status: AC
  Filled 2017-11-12: qty 1

## 2017-11-12 MED ORDER — ONDANSETRON HCL 4 MG PO TABS: 4.0000 mg | ORAL_TABLET | Freq: Four times a day (QID) | ORAL | Status: DC | PRN

## 2017-11-12 MED ORDER — ONDANSETRON HCL 4 MG/2ML IJ SOLN
INTRAMUSCULAR | Status: DC | PRN
  Administered 2017-11-12: 4 mg via INTRAVENOUS

## 2017-11-12 MED ORDER — FENTANYL CITRATE (PF) 100 MCG/2ML IJ SOLN
50.0000 ug | INTRAMUSCULAR | Status: AC
  Administered 2017-11-12: 100 ug via INTRAVENOUS
  Filled 2017-11-12: qty 2

## 2017-11-12 MED ORDER — PROMETHAZINE HCL 25 MG/ML IJ SOLN: 6.2500 mg | INTRAMUSCULAR | Status: DC | PRN

## 2017-11-12 MED ORDER — CEFAZOLIN SODIUM-DEXTROSE 2-4 GM/100ML-% IV SOLN
2.0000 g | INTRAVENOUS | Status: AC
  Administered 2017-11-12: 2 g via INTRAVENOUS
  Filled 2017-11-12: qty 100

## 2017-11-12 MED ORDER — INFLUENZA VAC SPLIT QUAD 0.5 ML IM SUSY
0.5000 mL | PREFILLED_SYRINGE | INTRAMUSCULAR | Status: AC
  Administered 2017-11-13: 0.5 mL via INTRAMUSCULAR
  Filled 2017-11-12: qty 0.5

## 2017-11-12 MED ORDER — DEXAMETHASONE SODIUM PHOSPHATE 10 MG/ML IJ SOLN
10.0000 mg | Freq: Once | INTRAMUSCULAR | Status: AC
  Administered 2017-11-12: 10 mg via INTRAVENOUS

## 2017-11-12 MED ORDER — METHOCARBAMOL 500 MG PO TABS
500.0000 mg | ORAL_TABLET | Freq: Four times a day (QID) | ORAL | Status: DC | PRN
  Administered 2017-11-12 – 2017-11-13 (×2): 500 mg via ORAL
  Filled 2017-11-12 (×2): qty 1

## 2017-11-12 MED ORDER — HYDROMORPHONE HCL 1 MG/ML IJ SOLN: 0.5000 mg | INTRAMUSCULAR | Status: DC | PRN

## 2017-11-12 MED ORDER — MENTHOL 3 MG MT LOZG: 1.0000 | LOZENGE | OROMUCOSAL | Status: DC | PRN

## 2017-11-12 MED ORDER — LACTATED RINGERS IV SOLN: INTRAVENOUS | Status: DC

## 2017-11-12 MED ORDER — PHENOL 1.4 % MT LIQD: 1.0000 | OROMUCOSAL | Status: DC | PRN

## 2017-11-12 MED ORDER — CHLORHEXIDINE GLUCONATE 4 % EX LIQD: 60.0000 mL | Freq: Once | CUTANEOUS | Status: DC

## 2017-11-12 MED ORDER — BUPIVACAINE-EPINEPHRINE 0.25% -1:200000 IJ SOLN
INTRAMUSCULAR | Status: DC | PRN
  Administered 2017-11-12: 30 mL

## 2017-11-12 MED ORDER — DOCUSATE SODIUM 100 MG PO CAPS
100.0000 mg | ORAL_CAPSULE | Freq: Two times a day (BID) | ORAL | Status: DC
  Administered 2017-11-12 – 2017-11-13 (×2): 100 mg via ORAL
  Filled 2017-11-12 (×2): qty 1

## 2017-11-12 MED ORDER — ACETAMINOPHEN 325 MG PO TABS: 325.0000 mg | ORAL_TABLET | Freq: Four times a day (QID) | ORAL | Status: DC | PRN

## 2017-11-12 MED ORDER — CLONIDINE HCL (ANALGESIA) 100 MCG/ML EP SOLN
EPIDURAL | Status: DC | PRN
  Administered 2017-11-12: 50 ug

## 2017-11-12 MED ORDER — SODIUM CHLORIDE 0.45 % IV SOLN
INTRAVENOUS | Status: DC
  Administered 2017-11-12: 16:00:00 via INTRAVENOUS

## 2017-11-12 MED ORDER — TRANEXAMIC ACID 1000 MG/10ML IV SOLN
1000.0000 mg | Freq: Once | INTRAVENOUS | Status: AC
  Administered 2017-11-12: 1000 mg via INTRAVENOUS
  Filled 2017-11-12: qty 1000

## 2017-11-12 SURGICAL SUPPLY — 48 items
BAG DECANTER FOR FLEXI CONT (MISCELLANEOUS) IMPLANT
BAG ZIPLOCK 12X15 (MISCELLANEOUS) IMPLANT
BANDAGE ACE 6X5 VEL STRL LF (GAUZE/BANDAGES/DRESSINGS) ×2 IMPLANT
BEARING MENISCAL TIBIAL 4 SM L (Orthopedic Implant) ×2 IMPLANT
BLADE SAW RECIPROCATING 77.5 (BLADE) ×2 IMPLANT
BLADE SAW SGTL 13.0X1.19X90.0M (BLADE) ×2 IMPLANT
BOWL SMART MIX CTS (DISPOSABLE) ×2 IMPLANT
CEMENT BONE R 1X40 (Cement) ×2 IMPLANT
COVER SURGICAL LIGHT HANDLE (MISCELLANEOUS) ×2 IMPLANT
CUFF TOURN SGL QUICK 34 (TOURNIQUET CUFF) ×1
CUFF TRNQT CYL 34X4X40X1 (TOURNIQUET CUFF) ×1 IMPLANT
DECANTER SPIKE VIAL GLASS SM (MISCELLANEOUS) IMPLANT
DERMABOND ADVANCED (GAUZE/BANDAGES/DRESSINGS) ×1
DERMABOND ADVANCED .7 DNX12 (GAUZE/BANDAGES/DRESSINGS) ×1 IMPLANT
DRAPE U-SHAPE 47X51 STRL (DRAPES) ×2 IMPLANT
DRESSING AQUACEL AG SP 3.5X10 (GAUZE/BANDAGES/DRESSINGS) ×1 IMPLANT
DRSG AQUACEL AG SP 3.5X10 (GAUZE/BANDAGES/DRESSINGS) ×2
DURAPREP 26ML APPLICATOR (WOUND CARE) ×4 IMPLANT
ELECT REM PT RETURN 15FT ADLT (MISCELLANEOUS) ×2 IMPLANT
GLOVE BIOGEL M 7.0 STRL (GLOVE) IMPLANT
GLOVE BIOGEL PI IND STRL 7.5 (GLOVE) ×1 IMPLANT
GLOVE BIOGEL PI IND STRL 8.5 (GLOVE) ×1 IMPLANT
GLOVE BIOGEL PI INDICATOR 7.5 (GLOVE) ×1
GLOVE BIOGEL PI INDICATOR 8.5 (GLOVE) ×1
GLOVE ECLIPSE 8.0 STRL XLNG CF (GLOVE) ×2 IMPLANT
GLOVE ORTHO TXT STRL SZ7.5 (GLOVE) ×4 IMPLANT
GOWN STRL REUS W/TWL 2XL LVL3 (GOWN DISPOSABLE) ×2 IMPLANT
GOWN STRL REUS W/TWL LRG LVL3 (GOWN DISPOSABLE) ×2 IMPLANT
GOWN STRL REUS W/TWL XL LVL3 (GOWN DISPOSABLE) ×2 IMPLANT
HOLDER FOLEY CATH W/STRAP (MISCELLANEOUS) IMPLANT
LEGGING LITHOTOMY PAIR STRL (DRAPES) ×2 IMPLANT
MANIFOLD NEPTUNE II (INSTRUMENTS) ×2 IMPLANT
NDL SAFETY ECLIPSE 18X1.5 (NEEDLE) IMPLANT
NEEDLE HYPO 18GX1.5 SHARP (NEEDLE)
PACK TOTAL KNEE CUSTOM (KITS) ×2 IMPLANT
PEG FEMORAL PEGGED STRL SM (Knees) ×2 IMPLANT
SUT MNCRL AB 4-0 PS2 18 (SUTURE) ×2 IMPLANT
SUT STRATAFIX PDS+ 0 24IN (SUTURE) ×2 IMPLANT
SUT VIC AB 1 CT1 36 (SUTURE) ×2 IMPLANT
SUT VIC AB 2-0 CT1 27 (SUTURE) ×2
SUT VIC AB 2-0 CT1 TAPERPNT 27 (SUTURE) ×2 IMPLANT
SYR 3ML LL SCALE MARK (SYRINGE) IMPLANT
SYR 50ML LL SCALE MARK (SYRINGE) ×2 IMPLANT
SYR 5ML LL (SYRINGE) ×2 IMPLANT
TRAY FOLEY MTR SLVR 16FR STAT (SET/KITS/TRAYS/PACK) IMPLANT
TRAY TIBIAL KNEE OXFORD STD AA (Joint) ×2 IMPLANT
WRAP KNEE MAXI GEL POST OP (GAUZE/BANDAGES/DRESSINGS) ×2 IMPLANT
YANKAUER SUCT BULB TIP NO VENT (SUCTIONS) ×2 IMPLANT

## 2017-11-12 NOTE — Brief Op Note (Signed)
11/12/2017  1:13 PM  PATIENT:  Jodi Bell  57 y.o. female  PRE-OPERATIVE DIAGNOSIS:  left knee osteoarthritis  POST-OPERATIVE DIAGNOSIS:  left knee osteoarthritis  PROCEDURE:  Procedure(s) with comments: left unicompartmental arthroplasty-medial (Left) - 20min  SURGEON:  Surgeon(s) and Role:    Paralee Cancel, MD - Primary  PHYSICIAN ASSISTANT: Molli Barrows, PA-C  ANESTHESIA:   regional and spinal  EBL:  100 mL   BLOOD ADMINISTERED:none  DRAINS: none   LOCAL MEDICATIONS USED:  MARCAINE     SPECIMEN:  No Specimen  DISPOSITION OF SPECIMEN:  N/A  COUNTS:  YES  TOURNIQUET:   Total Tourniquet Time Documented: Thigh (Left) - 34 minutes Total: Thigh (Left) - 34 minutes   DICTATION: .Other Dictation: Dictation Number [id number]  PLAN OF CARE: Admit for overnight observation  PATIENT DISPOSITION:  PACU - hemodynamically stable.   Delay start of Pharmacological VTE agent (>24hrs) due to surgical blood loss or risk of bleeding: no

## 2017-11-12 NOTE — Anesthesia Preprocedure Evaluation (Signed)
Anesthesia Evaluation  Patient identified by MRN, date of birth, ID band Patient awake    Reviewed: Allergy & Precautions, NPO status , Patient's Chart, lab work & pertinent test results  Airway Mallampati: II  TM Distance: >3 FB Neck ROM: Full    Dental no notable dental hx.    Pulmonary COPD, Current Smoker,    Pulmonary exam normal breath sounds clear to auscultation       Cardiovascular hypertension, Normal cardiovascular exam Rhythm:Regular Rate:Normal     Neuro/Psych negative neurological ROS  negative psych ROS   GI/Hepatic Neg liver ROS, GERD  ,  Endo/Other  negative endocrine ROSdiabetes  Renal/GU ESRFRenal disease  negative genitourinary   Musculoskeletal negative musculoskeletal ROS (+)   Abdominal   Peds negative pediatric ROS (+)  Hematology  (+) anemia ,   Anesthesia Other Findings   Reproductive/Obstetrics negative OB ROS                             Anesthesia Physical Anesthesia Plan  ASA: III  Anesthesia Plan: Spinal   Post-op Pain Management:  Regional for Post-op pain   Induction: Intravenous  PONV Risk Score and Plan: 2 and Ondansetron, Dexamethasone and Treatment may vary due to age or medical condition  Airway Management Planned: Simple Face Mask  Additional Equipment:   Intra-op Plan:   Post-operative Plan:   Informed Consent: I have reviewed the patients History and Physical, chart, labs and discussed the procedure including the risks, benefits and alternatives for the proposed anesthesia with the patient or authorized representative who has indicated his/her understanding and acceptance.   Dental advisory given  Plan Discussed with: CRNA and Surgeon  Anesthesia Plan Comments:         Anesthesia Quick Evaluation

## 2017-11-12 NOTE — Transfer of Care (Signed)
Immediate Anesthesia Transfer of Care Note  Patient: Jodi Bell  Procedure(s) Performed: Procedure(s) with comments: left unicompartmental arthroplasty-medial (Left) - 37min  Patient Location: PACU  Anesthesia Type:Spinal  Level of Consciousness:  sedated, patient cooperative and responds to stimulation  Airway & Oxygen Therapy:Patient Spontanous Breathing and Patient connected to face mask oxgen  Post-op Assessment:  Report given to PACU RN and Post -op Vital signs reviewed and stable  Post vital signs:  Reviewed and stable  Last Vitals:  Vitals:   11/12/17 1123 11/12/17 1124  BP:    Pulse: 73 73  Resp: 13 11  Temp:    SpO2: 55% 974%    Complications: No apparent anesthesia complications

## 2017-11-12 NOTE — Interval H&P Note (Signed)
History and Physical Interval Note:  11/12/2017 10:08 AM  Jodi Bell  has presented today for surgery, with the diagnosis of left knee osteoarthritis  The various methods of treatment have been discussed with the patient and family. After consideration of risks, benefits and other options for treatment, the patient has consented to  Procedure(s) with comments: left unicompartmental arthroplasty-medial (Left) - 9min as a surgical intervention .  The patient's history has been reviewed, patient examined, no change in status, stable for surgery.  I have reviewed the patient's chart and labs.  Questions were answered to the patient's satisfaction.     Mauri Pole

## 2017-11-12 NOTE — Anesthesia Procedure Notes (Signed)
Anesthesia Regional Block: Adductor canal block   Pre-Anesthetic Checklist: ,, timeout performed, Correct Patient, Correct Site, Correct Laterality, Correct Procedure, Correct Position, site marked, Risks and benefits discussed,  Surgical consent,  Pre-op evaluation,  At surgeon's request and post-op pain management  Laterality: Left  Prep: chloraprep       Needles:  Injection technique: Single-shot  Needle Type: Echogenic Needle     Needle Length: 9cm      Additional Needles:   Procedures:,,,, ultrasound used (permanent image in chart),,,,  Narrative:  Start time: 11/12/2017 11:18 AM End time: 11/12/2017 11:24 AM Injection made incrementally with aspirations every 5 mL.  Performed by: Personally  Anesthesiologist: Myrtie Soman, MD  Additional Notes: Patient tolerated the procedure well without complications

## 2017-11-12 NOTE — Evaluation (Signed)
Physical Therapy Evaluation Patient Details Name: Jodi Bell MRN: [id number] DOB: 07-Sep-1960 Today's Date: 11/12/2017   History of Present Illness  57 YO female s/p L UKR on 11/12/17. PMH includes anemia, OA, psoriatic arthritis, LBP and back fusion, CKD, COPD, DMII, HTN.   Clinical Impression   Pt presents with L knee pain, decreased mobility, and decreased tolerance for ambulation. Pt to benefit from acute PT. Pt ambulated room distance, but presented with mild L knee buckling. Further ambulation deferred due to this, and due to pt's high level of pain. PT to progress mobility as able, and will continue to follow acutely.     Follow Up Recommendations Follow surgeon's recommendation for DC plan and follow-up therapies;Supervision for mobility/OOB(HHPT )    Equipment Recommendations  None recommended by PT    Recommendations for Other Services       Precautions / Restrictions Precautions Precautions: Fall Restrictions Weight Bearing Restrictions: No Other Position/Activity Restrictions: WBAT       Mobility  Bed Mobility Overal bed mobility: Needs Assistance Bed Mobility: Supine to Sit     Supine to sit: Min assist;HOB elevated     General bed mobility comments: Min assist for LLE managment, trunk elevation, and scooting to EOB. Pt with increased time and effort. Pt with dizziness upon sitting EOB, BP 137/81. Dizziness subsided after approximately 1 minute.   Transfers Overall transfer level: Needs assistance Equipment used: Rolling walker (2 wheeled) Transfers: Sit to/from Stand Sit to Stand: Min assist;From elevated surface         General transfer comment: Min assist for power up. Pt with self-steadying. Pt with no L knee buckling with L and R weight shifts.   Ambulation/Gait Ambulation/Gait assistance: Min assist;+2 safety/equipment(chair follow ) Gait Distance (Feet): 5 Feet Assistive device: Rolling walker (2 wheeled) Gait Pattern/deviations: Step-to  pattern;Decreased stride length;Decreased stance time - left;Decreased weight shift to left Gait velocity: decr    General Gait Details: Pt with mild L knee buckling, initially PT-corrected with knee extension facilitation, then pt self-correcting. Ambulation stopped after a few steps/5 ft due to pt pain and due to mild L knee buckling.   Stairs            Wheelchair Mobility    Modified Rankin (Stroke Patients Only)       Balance Overall balance assessment: Mild deficits observed, not formally tested                                           Pertinent Vitals/Pain Pain Assessment: 0-10 Pain Score: 8  Pain Location: L knee  Pain Descriptors / Indicators: Sore;Operative site guarding Pain Intervention(s): Limited activity within patient's tolerance;Repositioned;Ice applied;Monitored during session;Premedicated before session    Home Living Family/patient expects to be discharged to:: Private residence Living Arrangements: Children(son ) Available Help at Discharge: Family;Available PRN/intermittently(sister is coming to help starting Monday, son works ) Type of Home: Apartment Home Access: Stairs to enter Entrance Stairs-Rails: Left;Right;Can reach both Technical brewer of Steps: [place]: One level Home Equipment: Bedside commode;Walker - 2 wheels;Adaptive equipment      Prior Function Level of Independence: Independent               Hand Dominance   Dominant Hand: Right    Extremity/Trunk Assessment   Upper Extremity Assessment Upper Extremity Assessment: Overall WFL for tasks assessed    Lower Extremity  Assessment Lower Extremity Assessment: Overall WFL for tasks assessed;LLE deficits/detail LLE Deficits / Details: suspected post-surgical weakness; able to perform weak quad set x3, ankle pumps  LLE Sensation: WNL    Cervical / Trunk Assessment Cervical / Trunk Assessment: Normal  Communication   Communication: No  difficulties  Cognition Arousal/Alertness: Lethargic;Suspect due to medications Behavior During Therapy: [facility] for tasks assessed/performed Overall Cognitive Status: Within Functional Limits for tasks assessed                                        General Comments      Exercises Total Joint Exercises Ankle Circles/Pumps: AROM;Both;10 reps;Seated   Assessment/Plan    PT Assessment Patient needs continued PT services  PT Problem List Decreased strength;Pain;Decreased range of motion;Decreased activity tolerance;Decreased knowledge of use of DME;Decreased balance;Decreased safety awareness;Decreased mobility       PT Treatment Interventions DME instruction;Therapeutic activities;Gait training;Therapeutic exercise;Patient/family education;Stair training;Balance training;Functional mobility training    PT Goals (Current goals can be found in the Care Plan section)  Acute Rehab PT Goals PT Goal Formulation: With patient Time For Goal Achievement: 11/19/17 Potential to Achieve Goals: Good    Frequency 7X/week   Barriers to discharge        Co-evaluation               AM-PAC PT "6 Clicks" Daily Activity  Outcome Measure Difficulty turning over in bed (including adjusting bedclothes, sheets and blankets)?: Unable Difficulty moving from lying on back to sitting on the side of the bed? : Unable Difficulty sitting down on and standing up from a chair with arms (e.g., wheelchair, bedside commode, etc,.)?: Unable Help needed moving to and from a bed to chair (including a wheelchair)?: A Little Help needed walking in hospital room?: A Little Help needed climbing 3-5 steps with a railing? : A Little 6 Click Score: 12    End of Session Equipment Utilized During Treatment: Gait belt Activity Tolerance: Patient limited by pain Patient left: in chair;with call bell/phone within reach;with family/visitor present;with nursing/sitter in room;with SCD's reapplied(chair  alarm not set, not functioning correctly. Pt reports she will not mobilize without assist ) Nurse Communication: Mobility status PT Visit Diagnosis: Other abnormalities of gait and mobility (R26.89);Difficulty in walking, not elsewhere classified (R26.2)    Time: 6295-2841 PT Time Calculation (min) (ACUTE ONLY): 20 min   Charges:   PT Evaluation $PT Eval Low Complexity: 1 Low         Julien Girt, PT Acute Rehabilitation Services Pager [phone]  Office [phone]   Lowell Mcgurk D Elonda Husky 11/12/2017, 6:24 PM

## 2017-11-12 NOTE — Progress Notes (Signed)
Assisted Dr. Rose with left, ultrasound guided, adductor canal block. Side rails up, monitors on throughout procedure. See vital signs in flow sheet. Tolerated Procedure well.  

## 2017-11-12 NOTE — Op Note (Signed)
NAME: Jodi Bell, Jodi Bell MEDICAL RECORD LY:[id number] ACCOUNT [id number] DATE OF BIRTH:[birth year] FACILITY: [facility] LOCATION: [facility] PHYSICIAN:Duell Holdren Marian Sorrow, MD  OPERATIVE REPORT  DATE OF PROCEDURE:  11/12/2017  PREOPERATIVE DIAGNOSIS:  Left knee medial compartment osteoarthritis.  POSTOPERATIVE DIAGNOSIS:  Left knee medial compartment osteoarthritis.  PROCEDURE:  Left knee partial knee arthroplasty, medial.  COMPONENTS USED:  Biomet Oxford knee system with a small femur, double A left medial tibial tray, a size 4 insert to match the small femur.  SURGEON:  Paralee Cancel, MD  ASSISTANT:  Danise Edge, PA-C.  Note that Mr. Freda Munro was present for the entirety of the case for preoperative positioning, perioperative management of the operative extremity, general facilitation of the case and primary wound closure.  ANESTHESIA:  Regional plus spinal.  SPECIMENS:  None.  COMPLICATIONS:  None.  DRAINS:  None.  ESTIMATED BLOOD LOSS:  Less than 100 mL.  TOURNIQUET TIME:  34 minutes at 250 mmHg.  INDICATIONS:  The patient is a 57 year old female with left knee osteoarthritis, predominantly medial base associated with pain correlating with this.  She also has multiple medical issues.  We discussed treatment options definitively including partial  versus total knee arthroplasty.  We felt that a partial knee arthroplasty would treat her knee intraarticular pathology the best allowing for a smooth recovery, less blood loss and less concern for need for transfusion, risk of infection, DVT component  failure.  Risks associated with medical comorbidities including renal failure were all discussed and reviewed, consent was obtained for benefit of pain relief.  DESCRIPTION OF PROCEDURE:  The patient was brought to the operative theater.  Once adequate anesthesia, preoperative antibiotics, Ancef administered.  She was positioned supine with a left thigh tourniquet placed.  The left lower extremity  was then  draped over an Oxford leg holder to allow for 120 degrees of flexion.  The right leg was positioned carefully in a leg holder.  The left lower extremity was prepped and draped in sterile fashion.  Timeout was performed identifying the patient, the  planned procedure and extremity.  Leg was exsanguinated, tourniquet elevated to 250 mmHg.  A paramidline incision was made from over the proximal pole of patella to the tibial tubercle.  Soft tissue dissection was carried out exposing the extensor  mechanism.  Median arthrotomy was then made.  Following initial exposure, we sized the femur to be a size small.  The size small spoon was kept in place and attached into the extramedullary guide parallel to the tibial crest using a size 4 G clamp.  The  reciprocating and oscillating saw was used to perform proximal tibial osteotomy.  The cut surface of the tibia next to the tibial spines was best fit with a size AA tibial tray.  With a size AA tibial tray and placed retractors removed, the size 4 feeler  gauge had appropriate tension on the medial collateral ligament 90 degrees of flexion.  Given these findings, attention was now directed to the femur.  Femoral canal was opened with a drill and then starting awl.  Intramedullary rod was then passed.   The intramedullary rod was then linked to the posterior cutting block guide and the drill holes made for the posterior cutting block.  The small posterior cutting block was tamped into the distal femur and the posterior cut made off the femur.  I placed  the 4 spigot first and milled the distal femur.  Excess bone was removed.  A trial reduction was now carried out  a small femur AA tibial tray.  With retractors, removed the size 4 feeler gauge had appropriate tension at 90 degrees; however, the 3 feeler  gauge had appropriate tension at 20 degrees of flexion.  Given the asymmetry and the tension at 90 and 20, we removed the trial components.  We placed a 5  spigot, milled the distal femur 1 mm and removed excessive bone.  At this point, I did a final  preparation of the tibia by pinning the tibial tray into place using reciprocating saw to create a trough for the keel, removed excessive bone.  Another trial was carried out with a small femur with the keel of the left AA tibial tray.  At this point,  the size 4 lollipop insert had appropriate tension on the medial collateral ligament at 90 and 20 degrees of flexion.  Given these findings, we removed all trial components.  We drilled the sclerotic bone.  We checked for posterior osteophytes and milled  the anterior aspect of the distal femur.  Final components were opened, cement was mixed.  The knee was irrigated with normal saline solution and the synovial capsular junction of the knee injected with 0.25% Marcaine with epinephrine, 1 mL of Toradol  and saline along the medial aspect of the joint.  The final components were then cemented into place and held at 45 degrees of flexion with a size 4 feeler gauge until the cement cured.  Once the cement fully cured, excessive cement was removed  throughout the knee.  I retrialed and selected the size 4 insert.  The final 4 insert to match the small femur was then snapped into place.  The extensor mechanism was then reapproximated using #1 Vicryl and Stratafix sutures.  The remaining wound was  closed with 2-0 Vicryl and a running Monocryl stitch.  The knee was then cleaned, dried and dressed sterilely using surgical glue and Aquacel dressing.  The patient was then brought to the recovery room in stable condition, tolerating the procedure well.   Findings were reviewed with family.  She will be admitted to the hospital overnight for observation and discharged tomorrow.  TN/NUANCE  D:11/12/2017 T:11/12/2017 JOB:[id number]

## 2017-11-12 NOTE — Anesthesia Procedure Notes (Signed)
Anesthesia Procedure Image    

## 2017-11-13 ENCOUNTER — Encounter (HOSPITAL_COMMUNITY): Payer: Self-pay | Admitting: Orthopedic Surgery

## 2017-11-13 DIAGNOSIS — M1712 Unilateral primary osteoarthritis, left knee: Secondary | ICD-10-CM | POA: Diagnosis not present

## 2017-11-13 MED ORDER — OXYCODONE HCL 10 MG PO TABS: 10.0000 mg | ORAL_TABLET | Freq: Four times a day (QID) | ORAL | 0 refills | Status: DC | PRN

## 2017-11-13 MED ORDER — METHOCARBAMOL 500 MG PO TABS: 500.0000 mg | ORAL_TABLET | Freq: Four times a day (QID) | ORAL | 0 refills | Status: DC | PRN

## 2017-11-13 MED ORDER — ENOXAPARIN SODIUM 30 MG/0.3ML ~~LOC~~ SOLN: 30.0000 mg | SUBCUTANEOUS | 0 refills | Status: DC

## 2017-11-13 NOTE — Progress Notes (Signed)
Physical Therapy Treatment Patient Details Name: Jodi Bell MRN: [id number] DOB: [birth year] Today's Date: 11/13/2017    History of Present Illness 57 YO female s/p L UKR on 11/12/17. PMH includes anemia, OA, psoriatic arthritis, LBP and back fusion, CKD, COPD, DMII, HTN.     PT Comments    Pt cooperative but moving slowly and with continued mild buckling at L knee.    Follow Up Recommendations  Follow surgeon's recommendation for DC plan and follow-up therapies;Supervision for mobility/OOB     Equipment Recommendations  None recommended by PT    Recommendations for Other Services       Precautions / Restrictions Precautions Precautions: Fall Restrictions Weight Bearing Restrictions: No Other Position/Activity Restrictions: WBAT     Mobility  Bed Mobility               General bed mobility comments: Pt up in chair and requests back to same  Transfers Overall transfer level: Needs assistance Equipment used: Rolling walker (2 wheeled) Transfers: Sit to/from Stand Sit to Stand: Min assist;Min guard         General transfer comment: cues for LE management and use of UEs to self assist  Ambulation/Gait Ambulation/Gait assistance: Min assist;Min guard Gait Distance (Feet): 50 Feet Assistive device: Rolling walker (2 wheeled) Gait Pattern/deviations: Step-to pattern;Decreased stride length;Decreased stance time - left;Decreased weight shift to left Gait velocity: decr    General Gait Details: Pt with mild L knee buckling, pt corrected.  Cues for posture, sequence and position from RW.   Stairs             Wheelchair Mobility    Modified Rankin (Stroke Patients Only)       Balance Overall balance assessment: Mild deficits observed, not formally tested                                          Cognition Arousal/Alertness: Awake/alert Behavior During Therapy: WFL for tasks assessed/performed Overall Cognitive Status: Within  Functional Limits for tasks assessed                                        Exercises Total Joint Exercises Ankle Circles/Pumps: AROM;Both;Seated;15 reps Quad Sets: AROM;Both;10 reps;Supine Heel Slides: AAROM;Left;15 reps;Supine Straight Leg Raises: AAROM;AROM;Left;10 reps;Supine Goniometric ROM: AAROM L knee -10 - 50    General Comments        Pertinent Vitals/Pain Pain Assessment: 0-10 Pain Score: 8  Pain Location: L knee  Pain Descriptors / Indicators: Sore;Operative site guarding Pain Intervention(s): Limited activity within patient's tolerance;Monitored during session;Premedicated before session;Ice applied    Home Living                      Prior Function            PT Goals (current goals can now be found in the care plan section) Acute Rehab PT Goals PT Goal Formulation: With patient Time For Goal Achievement: 11/19/17 Potential to Achieve Goals: Good Progress towards PT goals: Progressing toward goals    Frequency    7X/week      PT Plan Current plan remains appropriate    Co-evaluation              AM-PAC PT "6 Clicks" Daily Activity  Outcome Measure  Difficulty turning over in bed (including adjusting bedclothes, sheets and blankets)?: Unable Difficulty moving from lying on back to sitting on the side of the bed? : Unable Difficulty sitting down on and standing up from a chair with arms (e.g., wheelchair, bedside commode, etc,.)?: Unable Help needed moving to and from a bed to chair (including a wheelchair)?: A Little Help needed walking in hospital room?: A Little Help needed climbing 3-5 steps with a railing? : A Little 6 Click Score: 12    End of Session Equipment Utilized During Treatment: Gait belt Activity Tolerance: Patient limited by fatigue;Patient limited by pain Patient left: in chair;with call bell/phone within reach Nurse Communication: Mobility status PT Visit Diagnosis: Other abnormalities of  gait and mobility (R26.89);Difficulty in walking, not elsewhere classified (R26.2)     Time: 0935-1010 PT Time Calculation (min) (ACUTE ONLY): 35 min  Charges:  $Gait Training: 8-22 mins $Therapeutic Exercise: 8-22 mins                     [place] Pager [phone] Office [phone]    Emanuella Nickle 11/13/2017, 11:46 AM

## 2017-11-13 NOTE — Care Management Note (Signed)
Case Management Note  Patient Details  Name: Jodi Bell MRN: [id number] Date of Birth: 03-Mar-1960  Subjective/Objective:     Discharge planning, spoke with patient and spouse at bedside. Have chosen [facility] for [facility] PT, evaluate and treat.   Action/Plan: Contacted [facility] for referral. Has DME. [phone]               Expected Discharge Date:  11/13/17               Expected Discharge Plan:  [place]  In-House Referral:  NA  Discharge planning Services  CM Consult  Post Acute Care Choice:  Home Health, NA Choice offered to:  Patient  DME Arranged:  N/A DME Agency:  NA  HH Arranged:  PT [place] Agency:  [facility] (formerly [employer])  Status of Service:  Completed, signed off  If discussed at [employer], dates discussed:    Additional Comments:  Guadalupe Maple, RN 11/13/2017, 10:11 AM

## 2017-11-13 NOTE — Progress Notes (Signed)
Patient ID: Jodi Bell, female   DOB: [birth year], 57 y.o.   MRN: [id number] Subjective: 1 Day Post-Op Procedure(s) (LRB): left unicompartmental arthroplasty-medial (Left)    Patient reports pain as mild to moderate.  Up in chair this am.  Anxious about stairs.  No events  Objective:   VITALS:   Vitals:   11/13/17 0245 11/13/17 0529  BP: 135/77 (!) 143/89  Pulse: 80 87  Resp:  17  Temp: 98.6 F (37 C) 98.4 F (36.9 C)  SpO2: 96% 100%    Neurovascular intact Incision: dressing C/D/I  LABS Recent Labs    11/12/17 1401  HGB 8.2*  HCT 25.7*  WBC 6.1  PLT 258    Recent Labs    11/12/17 1020 11/12/17 1401  NA 140  --   K 3.9  --   BUN 38*  --   CREATININE 5.97* 6.03*  GLUCOSE 121*  --     No results for input(s): LABPT, INR in the last 72 hours.   Assessment/Plan: 1 Day Post-Op Procedure(s) (LRB): left unicompartmental arthroplasty-medial (Left)   Advance diet Up with therapy   Plan for discharge today after therapy HH PT for first 2 weeks - 5 visits Goals reviewed RTC in 2 weeks Oxycodone for pain Iron for chronic anemia Follow up with PCP and Nephrologist for chronic medical conditions

## 2017-11-13 NOTE — Progress Notes (Signed)
Physical Therapy Treatment Patient Details Name: Jodi Bell MRN: [id number] DOB: May 11, 1960 Today's Date: 11/13/2017    History of Present Illness 57 YO female s/p L UKR on 11/12/17. PMH includes anemia, OA, psoriatic arthritis, LBP and back fusion, CKD, COPD, DMII, HTN.     PT Comments    Pt progressing well with mobility and requiring decreased assist this session.  Pt up to [facility], reviewed don/doff KI for use on stairs and negotiated stairs with bil rails and assist.   Follow Up Recommendations  Follow surgeon's recommendation for DC plan and follow-up therapies;Supervision for mobility/OOB     Equipment Recommendations  None recommended by PT    Recommendations for Other Services       Precautions / Restrictions Precautions Precautions: Fall Restrictions Weight Bearing Restrictions: No Other Position/Activity Restrictions: WBAT     Mobility  Bed Mobility               General bed mobility comments: Pt up in chair and requests back to same  Transfers Overall transfer level: Needs assistance Equipment used: Rolling walker (2 wheeled) Transfers: Sit to/from [employer] Transfers Sit to Stand: Min guard;Supervision Stand pivot transfers: Min guard;Supervision       General transfer comment: Pt self-cues for LE management and use of UEs to self assist  Ambulation/Gait Ambulation/Gait assistance: Min guard;Supervision Gait Distance (Feet): 24 Feet Assistive device: Rolling walker (2 wheeled) Gait Pattern/deviations: Step-to pattern;Decreased stride length;Decreased stance time - left;Decreased weight shift to left Gait velocity: decr    General Gait Details: min cues for posture.  Pt self cueing for sequence and position from RW   Stairs Stairs: Yes Stairs assistance: Min assist Stair Management: Two rails;Step to pattern;Forwards Number of Stairs: 7 General stair comments: cues for sequence and foot placement   Wheelchair Mobility     Modified Rankin (Stroke Patients Only)       Balance Overall balance assessment: Mild deficits observed, not formally tested                                          Cognition Arousal/Alertness: Awake/alert Behavior During Therapy: WFL for tasks assessed/performed Overall Cognitive Status: Within Functional Limits for tasks assessed                                        Exercises Total Joint Exercises Ankle Circles/Pumps: AROM;Both;Seated;15 reps Quad Sets: AROM;Both;10 reps;Supine Heel Slides: AAROM;Left;15 reps;Supine Straight Leg Raises: AAROM;AROM;Left;10 reps;Supine Goniometric ROM: AAROM L knee -10 - 50    General Comments        Pertinent Vitals/Pain Pain Assessment: 0-10 Pain Score: 6  Pain Location: L knee  Pain Descriptors / Indicators: Aching;Sore Pain Intervention(s): Limited activity within patient's tolerance;Monitored during session;Premedicated before session;Ice applied    Home Living                      Prior Function            PT Goals (current goals can now be found in the care plan section) Acute Rehab PT Goals PT Goal Formulation: With patient Time For Goal Achievement: 11/19/17 Potential to Achieve Goals: Good Progress towards PT goals: Progressing toward goals    Frequency    7X/week      PT  Plan Current plan remains appropriate    Co-evaluation              AM-PAC PT "6 Clicks" Daily Activity  Outcome Measure  Difficulty turning over in bed (including adjusting bedclothes, sheets and blankets)?: A Lot Difficulty moving from lying on back to sitting on the side of the bed? : A Lot Difficulty sitting down on and standing up from a chair with arms (e.g., wheelchair, bedside commode, etc,.)?: A Lot Help needed moving to and from a bed to chair (including a wheelchair)?: A Little Help needed walking in hospital room?: A Little Help needed climbing 3-5 steps with a railing? :  A Little 6 Click Score: 15    End of Session Equipment Utilized During Treatment: Gait belt Activity Tolerance: Patient tolerated treatment well Patient left: in chair;with call bell/phone within reach Nurse Communication: Mobility status PT Visit Diagnosis: Other abnormalities of gait and mobility (R26.89);Difficulty in walking, not elsewhere classified (R26.2)     Time: 3976-7341 PT Time Calculation (min) (ACUTE ONLY): 28 min  Charges:  $Gait Training: 8-22 mins $Therapeutic Exercise: 8-22 mins $Therapeutic Activity: 8-22 mins                     Debe Coder PT Acute Rehabilitation Services Pager [phone] Office [phone]    Ariaunna Longsworth 11/13/2017, 1:12 PM

## 2017-11-13 NOTE — Discharge Instructions (Signed)
Dr. Paralee Cancel Total Joint Specialist [facility] [address] [place], [place] [address] [phone]  UNI KNEE REPLACEMENT POSTOPERATIVE DIRECTIONS   Knee Rehabilitation, Guidelines Following Surgery  Results after knee surgery are often greatly improved when you follow the exercise, range of motion and muscle strengthening exercises prescribed by your doctor. Safety measures are also important to protect the knee from further injury. Any time any of these exercises cause you to have increased pain or swelling in your knee joint, decrease the amount until you are comfortable again and slowly increase them. If you have problems or questions, call your caregiver or physical therapist for advice.   HOME CARE INSTRUCTIONS  Remove items at home which could result in a fall. This includes throw rugs or furniture in walking pathways.   ICE to the affected knee every three hours for 30 minutes at a time and then as needed for pain and swelling.  Continue to use ice on the knee for pain and swelling from surgery. You may notice swelling that will progress down to the foot and ankle.  This is normal after surgery.  Elevate the leg when you are not up walking on it.    Continue to use the breathing machine which will help keep your temperature down.  It is common for your temperature to cycle up and down following surgery, especially at night when you are not up moving around and exerting yourself.  The breathing machine keeps your lungs expanded and your temperature down.  Do not place pillow under knee, focus on keeping the knee straight while resting  DIET You may resume your previous home diet once your are discharged from the hospital.  DRESSING / WOUND CARE / SHOWERING Keep the surgical dressing until follow up.  The dressing is water proof, so you can shower without any extra covering.  IF THE DRESSING FALLS OFF or the wound gets wet inside, change the dressing with  sterile gauze.  Please use good hand washing techniques before changing the dressing.  Do not use any lotions or creams on the incision until instructed by your surgeon.   Leave the surgical dressing on the knee for 48 hours.  May remove the dressing on the second day.  Remove the Ace Wrap along with the cotton padding.  Leave the steri-strip bandaids in place along the incision on the skin.  Cover the incision each day with some dry gauze and paper tape. You may start showering once you are discharged home but do not submerge the incision under water. Just pat the incision dry and apply a dry gauze dressing on daily. Change the surgical dressing daily and reapply a dry dressing each time.  ACTIVITY Walk with your walker as instructed. Use walker as long as suggested by your caregivers. Avoid periods of inactivity such as sitting longer than an hour when not asleep. This helps prevent blood clots.  You may resume a sexual relationship in one month or when given the OK by your doctor.  You may return to work once you are cleared by your doctor.  Do not drive a car for 6 weeks or until released by you surgeon.  Do not drive while taking narcotics.  WEIGHT BEARING Weight bearing as tolerated with assist device (walker, cane, etc) as directed, use it as long as suggested by your surgeon or therapist, typically at least 4-6 weeks.  POSTOPERATIVE CONSTIPATION PROTOCOL Constipation - defined medically as fewer than three stools per  week and severe constipation as less than one stool per week.  One of the most common issues patients have following surgery is constipation.  Even if you have a regular bowel pattern at home, your normal regimen is likely to be disrupted due to multiple reasons following surgery.  Combination of anesthesia, postoperative narcotics, change in appetite and fluid intake all can affect your bowels.  In order to avoid complications following surgery, here are some recommendations  in order to help you during your recovery period.  Colace (docusate) - Pick up an over-the-counter form of Colace or another stool softener and take twice a day as long as you are requiring postoperative pain medications.  Take with a full glass of water daily.  If you experience loose stools or diarrhea, hold the colace until you stool forms back up.  If your symptoms do not get better within 1 week or if they get worse, check with your doctor.  Dulcolax (bisacodyl) - Pick up over-the-counter and take as directed by the product packaging as needed to assist with the movement of your bowels.  Take with a full glass of water.  Use this product as needed if not relieved by Colace only.   MiraLax (polyethylene glycol) - Pick up over-the-counter to have on hand.  MiraLax is a solution that will increase the amount of water in your bowels to assist with bowel movements.  Take as directed and can mix with a glass of water, juice, soda, coffee, or tea.  Take if you go more than two days without a movement. Do not use MiraLax more than once per day. Call your doctor if you are still constipated or irregular after using this medication for 7 days in a row.  If you continue to have problems with postoperative constipation, please contact the office for further assistance and recommendations.  If you experience "the worst abdominal pain ever" or develop nausea or vomiting, please contact the office immediatly for further recommendations for treatment.  ITCHING  If you experience itching with your medications, try taking only a single pain pill, or even half a pain pill at a time.  You can also use Benadryl over the counter for itching or also to help with sleep.   TED HOSE STOCKINGS Wear the elastic stockings on both legs for three weeks following surgery during the day but you may remove then at night for sleeping.  MEDICATIONS See your medication summary on the After Visit Summary that the nursing staff  will review with you prior to discharge.  You may have some home medications which will be placed on hold until you complete the course of blood thinner medication.  It is important for you to complete the blood thinner medication as prescribed by your surgeon.  Continue your approved medications as instructed at time of discharge.  PRECAUTIONS If you experience chest pain or shortness of breath - call 911 immediately for transfer to the hospital emergency department.  If you develop a fever greater that 101 F, purulent drainage from wound, increased redness or drainage from wound, foul odor from the wound/dressing, or calf pain - CONTACT YOUR SURGEON.                                                   FOLLOW-UP APPOINTMENTS Make sure you keep all of your  appointments after your operation with your surgeon and caregivers. You should call the office at the above phone number and make an appointment for approximately two weeks after the date of your surgery or on the date instructed by your surgeon outlined in the "After Visit Summary".  RANGE OF MOTION AND STRENGTHENING EXERCISES  Rehabilitation of the knee is important following a knee injury or an operation. After just a few days of immobilization, the muscles of the thigh which control the knee become weakened and shrink (atrophy). Knee exercises are designed to build up the tone and strength of the thigh muscles and to improve knee motion. Often times heat used for twenty to thirty minutes before working out will loosen up your tissues and help with improving the range of motion but do not use heat for the first two weeks following surgery. These exercises can be done on a training (exercise) mat, on the floor, on a table or on a bed. Use what ever works the best and is most comfortable for you Knee exercises include:  Leg Lifts - While your knee is still immobilized in a splint or cast, you can do straight leg raises. Lift the leg to 60 degrees, hold  for 3 sec, and slowly lower the leg. Repeat 10-20 times 2-3 times daily. Perform this exercise against resistance later as your knee gets better.  Quad and Hamstring Sets - Tighten up the muscle on the front of the thigh (Quad) and hold for 5-10 sec. Repeat this 10-20 times hourly. Hamstring sets are done by pushing the foot backward against an object and holding for 5-10 sec. Repeat as with quad sets.   Leg Slides: Lying on your back, slowly slide your foot toward your buttocks, bending your knee up off the floor (only go as far as is comfortable). Then slowly slide your foot back down until your leg is flat on the floor again.  Angel Wings: Lying on your back spread your legs to the side as far apart as you can without causing discomfort.  A rehabilitation program following serious knee injuries can speed recovery and prevent re-injury in the future due to weakened muscles. Contact your doctor or a physical therapist for more information on knee rehabilitation.   IF YOU ARE TRANSFERRED TO A SKILLED REHAB FACILITY If the patient is transferred to a skilled rehab facility following release from the hospital, a list of the current medications will be sent to the facility for the patient to continue.  When discharged from the skilled rehab facility, please have the facility set up the patient's [place] prior to being released. Also, the skilled facility will be responsible for providing the patient with their medications at time of release from the facility to include their pain medication, the muscle relaxants, and their blood thinner medication. If the patient is still at the rehab facility at time of the two week follow up appointment, the skilled rehab facility will also need to assist the patient in arranging follow up appointment in our office and any transportation needs.  MAKE SURE YOU:  Understand these instructions.  Get help right away if you are not doing well or get  worse.    Pick up stool softner and laxative for home use following surgery while on pain medications. Do not submerge incision under water. Please use good hand washing techniques while changing dressing each day. May shower starting three days after surgery. Please use a clean towel to pat the  incision dry following showers. Continue to use ice for pain and swelling after surgery. Do not use any lotions or creams on the incision until instructed by your surgeon.

## 2017-11-13 NOTE — Plan of Care (Signed)

## 2017-11-16 NOTE — Discharge Summary (Signed)
Physician Discharge Summary  Patient ID: [name] MRN: [id number] DOB/AGE: 12-Sep-1960 57 y.o.  Admit date: 11/12/2017 Discharge date: 11/13/2017   Procedures:  Procedure(s) (LRB): left unicompartmental arthroplasty-medial (Left)  Attending Physician:  Dr. Paralee Cancel   Admission Diagnoses:   Left knee medial compartmental primary OA /pain  Discharge Diagnoses:  Active Problems:   Status post left partial knee replacement  Past Medical History:  Diagnosis Date  . Anemia   . Arthritis    oa and psoriatic ra  . Asthma   . Back pain, chronic    lower back  . Candida infection finished tx 2 days ago  . Chronic insomnia   . Chronic kidney disease    stage 3 ckd last ov nephrology 04-29-17 epic  . Chronic kidney disease (CKD) stage G4/A1, severely decreased glomerular filtration rate (GFR) between 15-29 mL/min/1.73 square meter and albuminuria creatinine ratio less than 30 mg/g (HCC)   . COPD (chronic obstructive pulmonary disease) ([place])   . Diabetes mellitus    type 2  . Eczema   . Essential hypertension   . GERD (gastroesophageal reflux disease)   . History of hiatal hernia   . Obesity   . Pneumonia few yrs ago x 2    HPI:    Jodi Bell, 57 y.o. female , has a history of pain and functional disability in the left and has failed non-surgical conservative treatments for greater than 12 weeks to include NSAID's and/or analgesics, corticosteriod injections, viscosupplementation injections and activity modification.  Onset of symptoms was gradual, starting 2+ years ago with gradually worsening course since that time. The patient noted no past surgery on the left knee(s).  Patient currently rates pain in the left knee(s) at 9 out of 10 with activity. Patient has night pain, worsening of pain with activity and weight bearing, pain that interferes with activities of daily living, pain with passive range of motion, crepitus and joint swelling.  Patient has evidence of  periarticular osteophytes and joint space narrowing of the medial compartment by imaging studies.  There is no active infection.  Risks, benefits and expectations were discussed with the patient.  Risks including but not limited to the risk of anesthesia, blood clots, nerve damage, blood vessel damage, failure of the prosthesis, infection and up to and including death.  Patient understand the risks, benefits and expectations and wishes to proceed with surgery.   PCP: [facility]   Discharged Condition: good  Hospital Course:  Patient underwent the above stated procedure on 11/12/2017. Patient tolerated the procedure well and brought to the recovery room in good condition and subsequently to the floor.  POD #1 BP: 143/89 ; Pulse: 87 ; Temp: 98.4 F (36.9 C) ; Resp: 17 Patient reports pain as mild to moderate.  Up in chair this am.  Anxious about stairs.  No events.  Ready to be discharged home.  Neurovascular intact and incision: dressing C/D/I.   LABS  Basename    HGB     8.2  HCT     25.7    Discharge Exam: General appearance: alert, cooperative and no distress Extremities: Homans sign is negative, no sign of DVT, no edema, redness or tenderness in the calves or thighs and no ulcers, gangrene or trophic changes  Disposition: Home with follow up in 2 weeks   Follow-up Information    Paralee Cancel, MD. Schedule an appointment as soon as possible for a visit in 2 week(s).   Specialty:  Orthopedic Surgery Contact information: [address] [place] [address] [phone]        [facility] Follow up.   Specialty:  [place] Why:  phyiscal therapy Contact information: [address] [place] [address] [phone]           Discharge Instructions    Call MD / Call 911   Complete by:  As directed    If you experience chest pain or shortness of breath, CALL 911 and be transported to the hospital emergency room.   If you develope a fever above 101 F, pus (white drainage) or increased drainage or redness at the wound, or calf pain, call your surgeon's office.   Constipation Prevention   Complete by:  As directed    Drink plenty of fluids.  Prune juice may be helpful.  You may use a stool softener, such as Colace (over the counter) 100 mg twice a day.  Use MiraLax (over the counter) for constipation as needed.   Diet - low sodium heart healthy   Complete by:  As directed    Discharge instructions   Complete by:  As directed    Dr. Paralee Cancel [facility] [address], [place], [place] [address] [phone]  UNI KNEE REPLACEMENT POSTOPERATIVE DIRECTIONS   Knee Rehabilitation, Guidelines Following Surgery  Results after knee surgery are often greatly improved when you follow the exercise, range of motion and muscle strengthening exercises prescribed by your doctor. Safety measures are also important to protect the knee from further injury. Any time any of these exercises cause you to have increased pain or swelling in your knee joint, decrease the amount until you are comfortable again and slowly increase them. If you have problems or questions, call your caregiver or physical therapist for advice.   HOME CARE INSTRUCTIONS  Remove items at home which could result in a fall. This includes throw rugs or furniture in walking pathways.  ICE to the affected knee every three hours for 30 minutes at a time and then as needed for pain and swelling.  Continue to use ice on the knee for pain and swelling from surgery. You may notice swelling that will progress down to the foot and ankle.  This is normal after surgery.  Elevate the leg when you are not up walking on it.   Continue to use the breathing machine which will help keep your temperature down.  It is common for your temperature to cycle up and down following surgery, especially at night when you are not up moving around  and exerting yourself.  The breathing machine keeps your lungs expanded and your temperature down. Do not place pillow under knee, focus on keeping the knee straight while resting  DIET You may resume your previous home diet once your are discharged from the hospital.  DRESSING / WOUND CARE / SHOWERING Keep the surgical dressing until follow up.  The dressing is water proof, so you can shower without any extra covering.  IF THE DRESSING FALLS OFF or the wound gets wet inside, change the dressing with sterile gauze.  Please use good hand washing techniques before changing the dressing.  Do not use any lotions or creams on the incision until instructed by your surgeon.   Leave the surgical dressing on the knee for 48 hours.  May remove the dressing on the second day.  Remove the Ace Wrap along with the cotton  padding.  Leave the steri-strip bandaids in place along the incision on the skin.  Cover the incision each day with some dry gauze and paper tape. You may start showering once you are discharged home but do not submerge the incision under water. Just pat the incision dry and apply a dry gauze dressing on daily. Change the surgical dressing daily and reapply a dry dressing each time.  ACTIVITY Walk with your walker as instructed. Use walker as long as suggested by your caregivers. Avoid periods of inactivity such as sitting longer than an hour when not asleep. This helps prevent blood clots.  You may resume a sexual relationship in one month or when given the OK by your doctor.  You may return to work once you are cleared by your doctor.  Do not drive a car for 6 weeks or until released by you surgeon.  Do not drive while taking narcotics.  WEIGHT BEARING Weight bearing as tolerated with assist device (walker, cane, etc) as directed, use it as long as suggested by your surgeon or therapist, typically at least 4-6 weeks.  POSTOPERATIVE CONSTIPATION PROTOCOL Constipation - defined medically  as fewer than three stools per week and severe constipation as less than one stool per week.  One of the most common issues patients have following surgery is constipation.  Even if you have a regular bowel pattern at home, your normal regimen is likely to be disrupted due to multiple reasons following surgery.  Combination of anesthesia, postoperative narcotics, change in appetite and fluid intake all can affect your bowels.  In order to avoid complications following surgery, here are some recommendations in order to help you during your recovery period.  Colace (docusate) - Pick up an over-the-counter form of Colace or another stool softener and take twice a day as long as you are requiring postoperative pain medications.  Take with a full glass of water daily.  If you experience loose stools or diarrhea, hold the colace until you stool forms back up.  If your symptoms do not get better within 1 week or if they get worse, check with your doctor.  Dulcolax (bisacodyl) - Pick up over-the-counter and take as directed by the product packaging as needed to assist with the movement of your bowels.  Take with a full glass of water.  Use this product as needed if not relieved by Colace only.   MiraLax (polyethylene glycol) - Pick up over-the-counter to have on hand.  MiraLax is a solution that will increase the amount of water in your bowels to assist with bowel movements.  Take as directed and can mix with a glass of water, juice, soda, coffee, or tea.  Take if you go more than two days without a movement. Do not use MiraLax more than once per day. Call your doctor if you are still constipated or irregular after using this medication for 7 days in a row.  If you continue to have problems with postoperative constipation, please contact the office for further assistance and recommendations.  If you experience "the worst abdominal pain ever" or develop nausea or vomiting, please contact the office immediatly for  further recommendations for treatment.  ITCHING  If you experience itching with your medications, try taking only a single pain pill, or even half a pain pill at a time.  You can also use Benadryl over the counter for itching or also to help with sleep.   TED HOSE STOCKINGS Wear the elastic stockings on both  legs for three weeks following surgery during the day but you may remove then at night for sleeping.  MEDICATIONS See your medication summary on the "After Visit Summary" that the nursing staff will review with you prior to discharge.  You may have some home medications which will be placed on hold until you complete the course of blood thinner medication.  It is important for you to complete the blood thinner medication as prescribed by your surgeon.  Continue your approved medications as instructed at time of discharge.  PRECAUTIONS If you experience chest pain or shortness of breath - call 911 immediately for transfer to the hospital emergency department.  If you develop a fever greater that 101 F, purulent drainage from wound, increased redness or drainage from wound, foul odor from the wound/dressing, or calf pain - CONTACT YOUR SURGEON.                                                   FOLLOW-UP APPOINTMENTS Make sure you keep all of your appointments after your operation with your surgeon and caregivers. You should call the office at the above phone number and make an appointment for approximately two weeks after the date of your surgery or on the date instructed by your surgeon outlined in the "After Visit Summary".  RANGE OF MOTION AND STRENGTHENING EXERCISES  Rehabilitation of the knee is important following a knee injury or an operation. After just a few days of immobilization, the muscles of the thigh which control the knee become weakened and shrink (atrophy). Knee exercises are designed to build up the tone and strength of the thigh muscles and to improve knee motion. Often times  heat used for twenty to thirty minutes before working out will loosen up your tissues and help with improving the range of motion but do not use heat for the first two weeks following surgery. These exercises can be done on a training (exercise) mat, on the floor, on a table or on a bed. Use what ever works the best and is most comfortable for you Knee exercises include:  Leg Lifts - While your knee is still immobilized in a splint or cast, you can do straight leg raises. Lift the leg to 60 degrees, hold for 3 sec, and slowly lower the leg. Repeat 10-20 times 2-3 times daily. Perform this exercise against resistance later as your knee gets better.  Quad and Hamstring Sets - Tighten up the muscle on the front of the thigh (Quad) and hold for 5-10 sec. Repeat this 10-20 times hourly. Hamstring sets are done by pushing the foot backward against an object and holding for 5-10 sec. Repeat as with quad sets.  Leg Slides: Lying on your back, slowly slide your foot toward your buttocks, bending your knee up off the floor (only go as far as is comfortable). Then slowly slide your foot back down until your leg is flat on the floor again. Angel Wings: Lying on your back spread your legs to the side as far apart as you can without causing discomfort.  A rehabilitation program following serious knee injuries can speed recovery and prevent re-injury in the future due to weakened muscles. Contact your doctor or a physical therapist for more information on knee rehabilitation.   IF YOU ARE TRANSFERRED TO A SKILLED REHAB FACILITY If the patient  is transferred to a skilled rehab facility following release from the hospital, a list of the current medications will be sent to the facility for the patient to continue.  When discharged from the skilled rehab facility, please have the facility set up the patient's [place] prior to being released. Also, the skilled facility will be responsible for providing the  patient with their medications at time of release from the facility to include their pain medication, the muscle relaxants, and their blood thinner medication. If the patient is still at the rehab facility at time of the two week follow up appointment, the skilled rehab facility will also need to assist the patient in arranging follow up appointment in our office and any transportation needs.  MAKE SURE YOU:  Understand these instructions.  Get help right away if you are not doing well or get worse.    Pick up stool softner and laxative for home use following surgery while on pain medications. Do not submerge incision under water. Please use good hand washing techniques while changing dressing each day. May shower starting three days after surgery. Please use a clean towel to pat the incision dry following showers. Continue to use ice for pain and swelling after surgery. Do not use any lotions or creams on the incision until instructed by your surgeon.   Increase activity slowly as tolerated   Complete by:  As directed       Allergies as of 11/13/2017      Reactions   Adalimumab Other (See Comments)   Blisters on abdomen =humira   Ibuprofen Other (See Comments)   Renal Problems   Leflunomide And Related Other (See Comments)   Severe Headache   Lexapro [escitalopram Oxalate] Other (See Comments)   Hand problems - Serotonin Syndrome    Lisinopril Swelling   Angioedema    Other    Cosentyx= angioedema   Pistachio Nut (diagnostic)    Mouth itches and tongue swelling Hazel nuts and pecans also   Statins Other (See Comments)   Leg pains and weakness   Trazodone And Nefazodone Other (See Comments)   Serotonin Syndrome       Medication List    STOP taking these medications   nicotine 21 mg/24hr patch Commonly known as:  NICODERM CQ - dosed in mg/24 hours   sucralfate 1 GM/10ML suspension Commonly known as:  CARAFATE     TAKE these medications   allopurinol 300 MG  tablet Commonly known as:  ZYLOPRIM Take 150 mg by mouth daily.   amLODipine 10 MG tablet Commonly known as:  NORVASC Take 10 mg by mouth daily.   bisoprolol 10 MG tablet Commonly known as:  ZEBETA Take 20 mg by mouth daily.   cetirizine 10 MG tablet Commonly known as:  ZYRTEC Take 20 mg by mouth 2 (two) times daily.   cloNIDine 0.2 MG tablet Commonly known as:  CATAPRES Take 0.2 mg by mouth 2 (two) times daily.   diclofenac sodium 1 % Gel Commonly known as:  VOLTAREN Apply 2 g topically daily as needed (joint pain).   enoxaparin 30 MG/0.3ML injection Commonly known as:  LOVENOX Inject 0.3 mLs (30 mg total) into the skin daily.   EPINEPHrine 0.3 mg/0.3 mL Soaj injection Commonly known as:  EPI-PEN Inject 0.3 mLs (0.3 mg total) into the muscle once.   esomeprazole 20 MG capsule Commonly known as:  NEXIUM Take 20 mg by mouth every evening.   ferrous sulfate 325 (65 FE)  MG tablet Take 325 mg by mouth daily.   fluticasone 50 MCG/ACT nasal spray Commonly known as:  FLONASE Place 1 spray into both nostrils daily.   folic acid 932 MCG tablet Commonly known as:  FOLVITE Take 400 mcg by mouth daily.   furosemide 40 MG tablet Commonly known as:  LASIX Take 40 mg by mouth daily.   gabapentin 300 MG capsule Commonly known as:  NEURONTIN Take 600 mg by mouth at bedtime as needed (nerve pain).   hydrALAZINE 25 MG tablet Commonly known as:  APRESOLINE Take 25 mg by mouth 4 (four) times daily.   JANUVIA 100 MG tablet Generic drug:  sitaGLIPtin Take 100 mg by mouth daily.   methocarbamol 500 MG tablet Commonly known as:  ROBAXIN Take 1 tablet (500 mg total) by mouth every 6 (six) hours as needed for muscle spasms.   montelukast 10 MG tablet Commonly known as:  SINGULAIR Take 10 mg by mouth at bedtime.   multivitamin tablet Take 1 tablet by mouth daily.   Omega 3 1000 MG Caps Take 1,000 mg by mouth daily.   Oxycodone HCl 10 MG Tabs Take 1-2 tablets (10-20  mg total) by mouth every 6 (six) hours as needed for severe pain (pain score 7-10). What changed:    medication strength  how much to take  when to take this  reasons to take this  additional instructions   PERI-COLACE 8.6-50 MG tablet Generic drug:  senna-docusate Take 2 tablets by mouth at bedtime.   predniSONE 2.5 MG tablet Commonly known as:  DELTASONE Take 2.5 mg by mouth daily with breakfast.   PROBIOTIC DAILY PO Take 1 tablet by mouth daily.   promethazine 12.5 MG tablet Commonly known as:  PHENERGAN Take 12.5 mg by mouth daily as needed for nausea or vomiting.   ranitidine 300 MG tablet Commonly known as:  ZANTAC Take 300 mg by mouth 2 (two) times daily.   TRESIBA FLEXTOUCH 200 UNIT/ML Sopn Generic drug:  Insulin Degludec Inject 14 Units into the skin every evening.   VENTOLIN HFA 108 (90 Base) MCG/ACT inhaler Generic drug:  albuterol Inhale 1-2 puffs into the lungs every 6 (six) hours as needed for shortness of breath. wheezing   Vitamin D (Ergocalciferol) 50000 units Caps capsule Commonly known as:  DRISDOL Take 50,000 Units by mouth every Tuesday.   zolpidem 10 MG tablet Commonly known as:  AMBIEN Take 10 mg by mouth at bedtime.        Signed: [place] Pugh. Tania Perrott   PA-C  11/16/2017, 4:17 PM

## 2017-11-23 NOTE — Anesthesia Postprocedure Evaluation (Signed)
Anesthesia Post Note  Patient: Jodi Bell  Procedure(s) Performed: left unicompartmental arthroplasty-medial (Left )     Patient location during evaluation: PACU Anesthesia Type: Spinal Level of consciousness: oriented and awake and alert Pain management: pain level controlled Vital Signs Assessment: post-procedure vital signs reviewed and stable Respiratory status: spontaneous breathing, respiratory function stable and patient connected to nasal cannula oxygen Cardiovascular status: blood pressure returned to baseline and stable Postop Assessment: no headache, no backache and no apparent nausea or vomiting Anesthetic complications: no    Last Vitals:  Vitals:   11/13/17 0529 11/13/17 0858  BP: (!) 143/89 (!) 149/83  Pulse: 87 93  Resp: 17   Temp: 36.9 C 37.1 C  SpO2: 100% 99%    Last Pain:  Vitals:   11/13/17 1235  TempSrc:   PainSc: 3                  Irean Kendricks S

## 2017-12-12 ENCOUNTER — Observation Stay (HOSPITAL_COMMUNITY): Payer: Medicare PPO

## 2017-12-12 ENCOUNTER — Encounter (HOSPITAL_COMMUNITY): Payer: Self-pay | Admitting: Emergency Medicine

## 2017-12-12 ENCOUNTER — Observation Stay (HOSPITAL_COMMUNITY)
Admission: EM | Admit: 2017-12-12 | Discharge: 2017-12-14 | Disposition: A | Discharge status: Home / Self Care | Payer: Medicare PPO | Attending: Internal Medicine | Admitting: Internal Medicine

## 2017-12-12 ENCOUNTER — Other Ambulatory Visit: Payer: Self-pay

## 2017-12-12 ENCOUNTER — Emergency Department (HOSPITAL_COMMUNITY): Payer: Medicare PPO

## 2017-12-12 DIAGNOSIS — I214 Non-ST elevation (NSTEMI) myocardial infarction: Secondary | ICD-10-CM | POA: Diagnosis not present

## 2017-12-12 DIAGNOSIS — E1122 Type 2 diabetes mellitus with diabetic chronic kidney disease: Secondary | ICD-10-CM | POA: Diagnosis not present

## 2017-12-12 DIAGNOSIS — I10 Essential (primary) hypertension: Secondary | ICD-10-CM | POA: Diagnosis not present

## 2017-12-12 DIAGNOSIS — D649 Anemia, unspecified: Secondary | ICD-10-CM | POA: Insufficient documentation

## 2017-12-12 DIAGNOSIS — N185 Chronic kidney disease, stage 5: Secondary | ICD-10-CM | POA: Insufficient documentation

## 2017-12-12 DIAGNOSIS — Z7901 Long term (current) use of anticoagulants: Secondary | ICD-10-CM | POA: Diagnosis not present

## 2017-12-12 DIAGNOSIS — I120 Hypertensive chronic kidney disease with stage 5 chronic kidney disease or end stage renal disease: Secondary | ICD-10-CM | POA: Diagnosis not present

## 2017-12-12 DIAGNOSIS — R079 Chest pain, unspecified: Secondary | ICD-10-CM | POA: Diagnosis present

## 2017-12-12 DIAGNOSIS — Z79899 Other long term (current) drug therapy: Secondary | ICD-10-CM | POA: Insufficient documentation

## 2017-12-12 DIAGNOSIS — F17210 Nicotine dependence, cigarettes, uncomplicated: Secondary | ICD-10-CM | POA: Insufficient documentation

## 2017-12-12 DIAGNOSIS — E119 Type 2 diabetes mellitus without complications: Secondary | ICD-10-CM

## 2017-12-12 DIAGNOSIS — Z794 Long term (current) use of insulin: Secondary | ICD-10-CM

## 2017-12-12 DIAGNOSIS — J45909 Unspecified asthma, uncomplicated: Secondary | ICD-10-CM | POA: Insufficient documentation

## 2017-12-12 LAB — CBC
HCT: 26.2 % — ABNORMAL LOW (ref 36.0–46.0)
Hemoglobin: 8.2 g/dL — ABNORMAL LOW (ref 12.0–15.0)
MCH: 29.6 pg (ref 26.0–34.0)
MCHC: 31.3 g/dL (ref 30.0–36.0)
MCV: 94.6 fL (ref 80.0–100.0)
Platelets: 285 10*3/uL (ref 150–400)
RBC: 2.77 MIL/uL — ABNORMAL LOW (ref 3.87–5.11)
RDW: 14.2 % (ref 11.5–15.5)
WBC: 8.6 10*3/uL (ref 4.0–10.5)
nRBC: 0.2 % (ref 0.0–0.2)

## 2017-12-12 LAB — BASIC METABOLIC PANEL
Anion gap: 11 (ref 5–15)
BUN: 41 mg/dL — ABNORMAL HIGH (ref 6–20)
CO2: 18 mmol/L — ABNORMAL LOW (ref 22–32)
Calcium: 9.9 mg/dL (ref 8.9–10.3)
Chloride: 106 mmol/L (ref 98–111)
Creatinine, Ser: 5.5 mg/dL — ABNORMAL HIGH (ref 0.44–1.00)
GFR calc Af Amer: 9 mL/min — ABNORMAL LOW (ref >60)
GFR calc non Af Amer: 8 mL/min — ABNORMAL LOW (ref >60)
Glucose, Bld: 199 mg/dL — ABNORMAL HIGH (ref 70–99)
Potassium: 3.5 mmol/L (ref 3.5–5.1)
Sodium: 135 mmol/L (ref 135–145)

## 2017-12-12 LAB — I-STAT BETA HCG BLOOD, ED (MC, WL, AP ONLY): I-stat hCG, quantitative: <5 m[IU]/mL (ref <5)

## 2017-12-12 LAB — MRSA PCR SCREENING: MRSA by PCR: NEGATIVE

## 2017-12-12 LAB — ECHOCARDIOGRAM COMPLETE
Height: 64 in
Weight: 2864.22 oz

## 2017-12-12 LAB — CBG MONITORING, ED: Glucose-Capillary: 246 mg/dL — ABNORMAL HIGH (ref 70–99)

## 2017-12-12 LAB — GLUCOSE, CAPILLARY
Glucose-Capillary: 113 mg/dL — ABNORMAL HIGH (ref 70–99)
Glucose-Capillary: 157 mg/dL — ABNORMAL HIGH (ref 70–99)
Glucose-Capillary: 138 mg/dL — ABNORMAL HIGH (ref 70–99)

## 2017-12-12 LAB — D-DIMER, QUANTITATIVE: D-Dimer, Quant: 2.63 ug/mL-FEU — ABNORMAL HIGH (ref 0.00–0.50)

## 2017-12-12 LAB — I-STAT TROPONIN, ED: Troponin i, poc: 0.03 ng/mL (ref 0.00–0.08)

## 2017-12-12 LAB — TROPONIN I
Troponin I: 0.03 ng/mL (ref <0.03)
Troponin I: 0.06 ng/mL (ref <0.03)
Troponin I: 0.06 ng/mL (ref <0.03)

## 2017-12-12 LAB — HEPARIN LEVEL (UNFRACTIONATED)
Heparin Unfractionated: 0.32 IU/mL (ref 0.30–0.70)
Heparin Unfractionated: 0.26 IU/mL — ABNORMAL LOW (ref 0.30–0.70)

## 2017-12-12 MED ORDER — TECHNETIUM TC 99M DIETHYLENETRIAME-PENTAACETIC ACID
31.7000 | Freq: Once | INTRAVENOUS | Status: AC | PRN
  Administered 2017-12-12: 31.7 via RESPIRATORY_TRACT

## 2017-12-12 MED ORDER — AMLODIPINE BESYLATE 10 MG PO TABS
10.0000 mg | ORAL_TABLET | Freq: Every day | ORAL | Status: DC
  Administered 2017-12-12 – 2017-12-14 (×3): 10 mg via ORAL
  Filled 2017-12-12 (×3): qty 1

## 2017-12-12 MED ORDER — HEPARIN (PORCINE) IN NACL 100-0.45 UNIT/ML-% IJ SOLN
1450.0000 [IU]/h | INTRAMUSCULAR | Status: DC
  Administered 2017-12-12 (×2): 1300 [IU]/h via INTRAVENOUS
  Administered 2017-12-13: 1450 [IU]/h via INTRAVENOUS
  Filled 2017-12-12 (×4): qty 250

## 2017-12-12 MED ORDER — NITROGLYCERIN 0.4 MG SL SUBL
0.4000 mg | SUBLINGUAL_TABLET | SUBLINGUAL | Status: DC | PRN
  Administered 2017-12-12 (×2): 0.4 mg via SUBLINGUAL
  Filled 2017-12-12: qty 1

## 2017-12-12 MED ORDER — METHOCARBAMOL 500 MG PO TABS: 500.0000 mg | ORAL_TABLET | Freq: Four times a day (QID) | ORAL | Status: DC | PRN

## 2017-12-12 MED ORDER — ZOLPIDEM TARTRATE 5 MG PO TABS
5.0000 mg | ORAL_TABLET | Freq: Every day | ORAL | Status: DC
  Administered 2017-12-12 – 2017-12-13 (×2): 5 mg via ORAL
  Filled 2017-12-12 (×2): qty 1

## 2017-12-12 MED ORDER — HEPARIN BOLUS VIA INFUSION
4000.0000 [IU] | Freq: Once | INTRAVENOUS | Status: DC
  Filled 2017-12-12: qty 4000

## 2017-12-12 MED ORDER — CLONIDINE HCL 0.2 MG PO TABS
0.2000 mg | ORAL_TABLET | Freq: Two times a day (BID) | ORAL | Status: DC
  Administered 2017-12-12 – 2017-12-14 (×5): 0.2 mg via ORAL
  Filled 2017-12-12 (×5): qty 1

## 2017-12-12 MED ORDER — PROMETHAZINE HCL 25 MG PO TABS: 12.5000 mg | ORAL_TABLET | Freq: Every day | ORAL | Status: DC | PRN

## 2017-12-12 MED ORDER — INSULIN ASPART 100 UNIT/ML ~~LOC~~ SOLN
0.0000 [IU] | SUBCUTANEOUS | Status: DC
  Administered 2017-12-12: 2 [IU] via SUBCUTANEOUS
  Administered 2017-12-12: 3 [IU] via SUBCUTANEOUS
  Administered 2017-12-12: 2 [IU] via SUBCUTANEOUS
  Filled 2017-12-12: qty 1

## 2017-12-12 MED ORDER — GABAPENTIN 300 MG PO CAPS
600.0000 mg | ORAL_CAPSULE | Freq: Every evening | ORAL | Status: DC | PRN
  Administered 2017-12-13: 600 mg via ORAL
  Filled 2017-12-12: qty 2

## 2017-12-12 MED ORDER — ACETAMINOPHEN 325 MG PO TABS
650.0000 mg | ORAL_TABLET | ORAL | Status: DC | PRN
  Administered 2017-12-13: 650 mg via ORAL
  Filled 2017-12-12: qty 2

## 2017-12-12 MED ORDER — SENNOSIDES-DOCUSATE SODIUM 8.6-50 MG PO TABS
2.0000 | ORAL_TABLET | Freq: Every day | ORAL | Status: DC
  Administered 2017-12-14: 2 via ORAL
  Filled 2017-12-12 (×3): qty 2

## 2017-12-12 MED ORDER — SODIUM CHLORIDE 0.9 % IV BOLUS (SEPSIS)
1000.0000 mL | Freq: Once | INTRAVENOUS | Status: AC
  Administered 2017-12-12: 1000 mL via INTRAVENOUS

## 2017-12-12 MED ORDER — HEPARIN BOLUS VIA INFUSION
4000.0000 [IU] | Freq: Once | INTRAVENOUS | Status: AC
  Administered 2017-12-12: 4000 [IU] via INTRAVENOUS
  Filled 2017-12-12: qty 4000

## 2017-12-12 MED ORDER — ASPIRIN 81 MG PO CHEW
324.0000 mg | CHEWABLE_TABLET | Freq: Once | ORAL | Status: DC
  Filled 2017-12-12: qty 4

## 2017-12-12 MED ORDER — HYDRALAZINE HCL 50 MG PO TABS
50.0000 mg | ORAL_TABLET | Freq: Three times a day (TID) | ORAL | Status: DC
  Administered 2017-12-12 – 2017-12-14 (×5): 50 mg via ORAL
  Filled 2017-12-12 (×5): qty 1

## 2017-12-12 MED ORDER — ALBUTEROL SULFATE (2.5 MG/3ML) 0.083% IN NEBU: 3.0000 mL | INHALATION_SOLUTION | Freq: Four times a day (QID) | RESPIRATORY_TRACT | Status: DC | PRN

## 2017-12-12 MED ORDER — MONTELUKAST SODIUM 10 MG PO TABS
10.0000 mg | ORAL_TABLET | Freq: Every day | ORAL | Status: DC
  Administered 2017-12-12 – 2017-12-13 (×2): 10 mg via ORAL
  Filled 2017-12-12 (×2): qty 1

## 2017-12-12 MED ORDER — FUROSEMIDE 40 MG PO TABS
40.0000 mg | ORAL_TABLET | Freq: Every day | ORAL | Status: DC
  Administered 2017-12-12 – 2017-12-14 (×3): 40 mg via ORAL
  Filled 2017-12-12 (×3): qty 1

## 2017-12-12 MED ORDER — ADULT MULTIVITAMIN W/MINERALS CH
1.0000 | ORAL_TABLET | Freq: Every day | ORAL | Status: DC
  Administered 2017-12-12 – 2017-12-14 (×3): 1 via ORAL
  Filled 2017-12-12 (×3): qty 1

## 2017-12-12 MED ORDER — FLUTICASONE PROPIONATE 50 MCG/ACT NA SUSP
1.0000 | Freq: Every day | NASAL | Status: DC
  Administered 2017-12-12: 1 via NASAL
  Filled 2017-12-12: qty 16

## 2017-12-12 MED ORDER — ALLOPURINOL 300 MG PO TABS
150.0000 mg | ORAL_TABLET | Freq: Every day | ORAL | Status: DC
  Administered 2017-12-12 – 2017-12-14 (×3): 150 mg via ORAL
  Filled 2017-12-12 (×3): qty 1

## 2017-12-12 MED ORDER — OXYCODONE HCL 5 MG PO TABS
10.0000 mg | ORAL_TABLET | Freq: Four times a day (QID) | ORAL | Status: DC | PRN
  Administered 2017-12-13 – 2017-12-14 (×2): 10 mg via ORAL
  Filled 2017-12-12 (×2): qty 2

## 2017-12-12 MED ORDER — FERROUS SULFATE 325 (65 FE) MG PO TABS
325.0000 mg | ORAL_TABLET | Freq: Every day | ORAL | Status: DC
  Administered 2017-12-12 – 2017-12-14 (×3): 325 mg via ORAL
  Filled 2017-12-12 (×3): qty 1

## 2017-12-12 MED ORDER — HYDRALAZINE HCL 25 MG PO TABS
25.0000 mg | ORAL_TABLET | Freq: Four times a day (QID) | ORAL | Status: DC
  Administered 2017-12-12: 25 mg via ORAL
  Filled 2017-12-12 (×2): qty 1

## 2017-12-12 MED ORDER — HEPARIN (PORCINE) IN NACL 100-0.45 UNIT/ML-% IJ SOLN
1300.0000 [IU]/h | INTRAMUSCULAR | Status: DC
  Filled 2017-12-12: qty 250

## 2017-12-12 MED ORDER — BISOPROLOL FUMARATE 10 MG PO TABS
20.0000 mg | ORAL_TABLET | Freq: Every day | ORAL | Status: DC
  Administered 2017-12-12 – 2017-12-14 (×3): 20 mg via ORAL
  Filled 2017-12-12 (×4): qty 2

## 2017-12-12 MED ORDER — PREDNISONE 2.5 MG PO TABS
2.5000 mg | ORAL_TABLET | Freq: Every day | ORAL | Status: DC
  Administered 2017-12-13 – 2017-12-14 (×2): 2.5 mg via ORAL
  Filled 2017-12-12 (×3): qty 1

## 2017-12-12 MED ORDER — ACETAMINOPHEN 500 MG PO TABS
1000.0000 mg | ORAL_TABLET | Freq: Once | ORAL | Status: AC
  Administered 2017-12-12: 1000 mg via ORAL
  Filled 2017-12-12: qty 2

## 2017-12-12 MED ORDER — PANTOPRAZOLE SODIUM 40 MG PO TBEC
40.0000 mg | DELAYED_RELEASE_TABLET | Freq: Every day | ORAL | Status: DC
  Administered 2017-12-12 – 2017-12-13 (×2): 40 mg via ORAL
  Filled 2017-12-12 (×2): qty 1

## 2017-12-12 MED ORDER — INSULIN ASPART 100 UNIT/ML ~~LOC~~ SOLN
0.0000 [IU] | Freq: Three times a day (TID) | SUBCUTANEOUS | Status: DC
  Administered 2017-12-13: 3 [IU] via SUBCUTANEOUS
  Administered 2017-12-14 (×2): 1 [IU] via SUBCUTANEOUS

## 2017-12-12 MED ORDER — FOLIC ACID 1 MG PO TABS
500.0000 ug | ORAL_TABLET | Freq: Every day | ORAL | Status: DC
  Administered 2017-12-12 – 2017-12-14 (×3): 0.5 mg via ORAL
  Filled 2017-12-12 (×3): qty 1

## 2017-12-12 MED ORDER — HEPARIN SODIUM (PORCINE) 5000 UNIT/ML IJ SOLN: 5000.0000 [IU] | Freq: Three times a day (TID) | INTRAMUSCULAR | Status: DC

## 2017-12-12 MED ORDER — ONDANSETRON HCL 4 MG/2ML IJ SOLN: 4.0000 mg | Freq: Four times a day (QID) | INTRAMUSCULAR | Status: DC | PRN

## 2017-12-12 MED ORDER — TECHNETIUM TO 99M ALBUMIN AGGREGATED
4.3500 | Freq: Once | INTRAVENOUS | Status: AC | PRN
  Administered 2017-12-12: 4.35 via INTRAVENOUS

## 2017-12-12 NOTE — Progress Notes (Signed)
ANTICOAGULATION CONSULT NOTE - Initial Consult  Pharmacy Consult for heparin Indication: r/o PE  Allergies  Allergen Reactions  . Adalimumab Other (See Comments)    Blisters on abdomen =humira  . Ibuprofen Other (See Comments)    Renal Problems  . Leflunomide And Related Other (See Comments)    Severe Headache  . Lexapro [Escitalopram Oxalate] Other (See Comments)    Hand problems - Serotonin Syndrome   . Lisinopril Swelling    Angioedema   . Other     Cosentyx= angioedema   . Pistachio Nut (Diagnostic)     Mouth itches and tongue swelling Hazel nuts and pecans also  . Statins Other (See Comments)    Leg pains and weakness  . Trazodone And Nefazodone Other (See Comments)    Serotonin Syndrome     Patient Measurements: Height: 5\' 4"  (162.6 cm) Weight: 179 lb 0.2 oz (81.2 kg) IBW/kg (Calculated) : 54.7 Heparin Dosing Weight: 75kg  Vital Signs: Temp: 98.7 F (37.1 C) (11/02 0120) Temp Source: Oral (11/02 0120) BP: 144/79 (11/02 0356) Pulse Rate: 127 (11/02 0356)  Labs: Recent Labs    12/12/17 0132  HGB 8.2*  HCT 26.2*  PLT 285  CREATININE 5.50*    Estimated Creatinine Clearance: 11.6 mL/min (A) (by C-G formula based on SCr of 5.5 mg/dL (H)).   Medical History: Past Medical History:  Diagnosis Date  . Anemia   . Arthritis    oa and psoriatic ra  . Asthma   . Back pain, chronic    lower back  . Candida infection finished tx 2 days ago  . Chronic insomnia   . Chronic kidney disease    stage 3 ckd last ov nephrology 04-29-17 epic  . Chronic kidney disease (CKD) stage G4/A1, severely decreased glomerular filtration rate (GFR) between 15-29 mL/min/1.73 square meter and albuminuria creatinine ratio less than 30 mg/g (HCC)   . COPD (chronic obstructive pulmonary disease) ([place])   . Diabetes mellitus    type 2  . Eczema   . Essential hypertension   . GERD (gastroesophageal reflux disease)   . History of hiatal hernia   . Obesity   . Pneumonia few yrs ago  x 2    Assessment: 57yo female c/o SOB and left-sided CP, D-dimer elevated and noted to have had TKA 20mo ago >> concern for PE, to start heparin.  Goal of Therapy:  Heparin level 0.3-0.7 units/ml Monitor platelets by anticoagulation protocol: Yes   Plan:  Will give heparin 4000 units IV bolus x1 followed by gtt at 1300 units/hr and monitor heparin levels and CBC.  Wynona Neat, PharmD, BCPS  12/12/2017,5:29 AM

## 2017-12-12 NOTE — Progress Notes (Addendum)
[place] for heparin Indication: r/o PE  Allergies  Allergen Reactions  . Adalimumab Other (See Comments)    Blisters on abdomen =humira  . Ibuprofen Other (See Comments)    Renal Problems  . Leflunomide And Related Other (See Comments)    Severe Headache  . Lexapro [Escitalopram Oxalate] Other (See Comments)    Hand problems - Serotonin Syndrome   . Lisinopril Swelling    Angioedema   . Other     Cosentyx= angioedema   . Pistachio Nut (Diagnostic)     Mouth itches and tongue swelling Hazel nuts and pecans also  . Statins Other (See Comments)    Leg pains and weakness  . Trazodone And Nefazodone Other (See Comments)    Serotonin Syndrome     Patient Measurements: Height: 5\' 4"  (162.6 cm) Weight: 179 lb 0.2 oz (81.2 kg) IBW/kg (Calculated) : 54.7 Heparin Dosing Weight: 75kg  Vital Signs: Temp: 98.2 F (36.8 C) (11/02 0751) Temp Source: Oral (11/02 0751) BP: 142/95 (11/02 1400) Pulse Rate: 87 (11/02 1400)  Labs: Recent Labs    12/12/17 0132 12/12/17 0357 12/12/17 0759 12/12/17 1347  HGB 8.2*  --   --   --   HCT 26.2*  --   --   --   PLT 285  --   --   --   HEPARINUNFRC  --   --   --  0.32  CREATININE 5.50*  --   --   --   TROPONINI  --  0.03* 0.06*  --     Estimated Creatinine Clearance: 11.6 mL/min (A) (by C-G formula based on SCr of 5.5 mg/dL (H)).   Medical History: Past Medical History:  Diagnosis Date  . Anemia   . Arthritis    oa and psoriatic ra  . Asthma   . Back pain, chronic    lower back  . Candida infection finished tx 2 days ago  . Chronic insomnia   . Chronic kidney disease    stage 3 ckd last ov nephrology 04-29-17 epic  . Chronic kidney disease (CKD) stage G4/A1, severely decreased glomerular filtration rate (GFR) between 15-29 mL/min/1.73 square meter and albuminuria creatinine ratio less than 30 mg/g (HCC)   . COPD (chronic obstructive pulmonary disease) ([address])   . Diabetes mellitus    type  2  . Eczema   . Essential hypertension   . GERD (gastroesophageal reflux disease)   . History of hiatal hernia   . Obesity   . Pneumonia few yrs ago x 2    Assessment: 57yo female c/o SOB and left-sided CP and concern for ACS (VQ negative for PE) -heparin level = 0.32 on 1300 units/hr  Goal of Therapy:  Heparin level 0.3-0.7 units/ml Monitor platelets by anticoagulation protocol: Yes   Plan:  -No heparin changes needed -Heparin level in 6 hours and daily wth CBC daily  Hildred Laser, PharmD Clinical Pharmacist **Pharmacist phone directory can now be found on [email or web address] ([email or web address]).  Listed under [place].  Addendum -heparin level= 0.26  Plan  -increase heparin to 1450 units/ht -Heparin level in 8 hours and daily wth CBC daily  Hildred Laser, PharmD Clinical Pharmacist **Pharmacist phone directory can now be found on [email or web address] ([email or web address]).  Listed under [place].

## 2017-12-12 NOTE — Consult Note (Signed)
Reason for Consult: Chest pain Referring Physician: [facility]  [name] is an 57 y.o. female.  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 On arrival, patient was hypertensive and tachyardic. Chest pain improved with NTG. BP has now improved.   Patient sess a nephrologist Dr. Neta Bell and is contemplating getting an AV Fistula placed for dialysis.     Past Medical History:  Diagnosis Date  . Anemia   . Arthritis    oa and psoriatic ra  . Asthma   . Back pain, chronic    lower back  . Candida infection finished tx 2 days ago  . Chronic insomnia   . Chronic kidney disease    stage 3 ckd last ov nephrology 04-29-17 epic  . Chronic kidney disease (CKD) stage G4/A1, severely decreased glomerular filtration rate (GFR) between 15-29 mL/min/1.73 square meter and albuminuria creatinine ratio less than 30 mg/g (HCC)   . COPD (chronic obstructive pulmonary disease) ([place])   . Diabetes mellitus    type 2  . Eczema   . Essential hypertension   . GERD (gastroesophageal reflux disease)   . History of hiatal hernia   . Obesity   . Pneumonia few yrs ago x 2    Past Surgical History:  Procedure Laterality Date  . BACK SURGERY  2016   lower back fusion with cage  . BIOPSY  09/23/2017   Procedure: BIOPSY;  Surgeon: Wonda Horner, MD;  Location: [facility];  Service: Endoscopy;;  . CESAREAN SECTION  1990   x 1   . COLONOSCOPY WITH PROPOFOL N/A 09/23/2017   Procedure: COLONOSCOPY WITH PROPOFOL Hemostatic clips placed;  Surgeon: Wonda Horner, MD;  Location: [facility];  Service: Endoscopy;  Laterality: N/A;  . DILATION AND CURETTAGE OF UTERUS  1988  . ESOPHAGOGASTRODUODENOSCOPY (EGD) WITH PROPOFOL N/A 09/23/2017   Procedure: ESOPHAGOGASTRODUODENOSCOPY (EGD) WITH PROPOFOL;  Surgeon: Wonda Horner, MD;  Location: [facility];  Service:  Endoscopy;  Laterality: N/A;  . PARTIAL KNEE ARTHROPLASTY Left 11/12/2017   Procedure: left unicompartmental arthroplasty-medial;  Surgeon: Paralee Cancel, MD;  Location: [facility];  Service: Orthopedics;  Laterality: Left;  91mn  . SUBMUCOSAL INJECTION  09/23/2017   Procedure: SUBMUCOSAL INJECTION;  Surgeon: GWonda Horner MD;  Location: [facility];  Service: Endoscopy;;  in colon  . TUBAL LIGATION  1990    Family History  Problem Relation Age of Onset  . Lung cancer Mother   . Diabetes Sister   . Heart disease Sister   . Thyroid cancer Unknown        1/2 sister  . Heart disease Unknown        1/2 sister    Social History:  reports that she has been smoking cigarettes. She has a 20.00 pack-year smoking history. She has never used smokeless tobacco. She reports that she drinks alcohol. She reports that she does not use drugs.  Allergies:  Allergies  Allergen Reactions  . Adalimumab Other (See Comments)    Blisters on abdomen =humira  . Ibuprofen Other (See Comments)    Renal Problems  . Leflunomide And Related Other (See Comments)    Severe Headache  . Lexapro [Escitalopram Oxalate] Other (See Comments)    Hand problems - Serotonin Syndrome   . Lisinopril Swelling    Angioedema   . Other  Cosentyx= angioedema   . Pistachio Nut (Diagnostic)     Mouth itches and tongue swelling Hazel nuts and pecans also  . Statins Other (See Comments)    Leg pains and weakness  . Trazodone And Nefazodone Other (See Comments)    Serotonin Syndrome     Medications: I have reviewed the patient's current medications.  Results for orders placed or performed during the hospital encounter of 12/12/17 (from the past 48 hour(s))  Basic metabolic panel     Status: Abnormal   Collection Time: 12/12/17  1:32 AM  Result Value Ref Range   Sodium 135 135 - 145 mmol/L   Potassium 3.5 3.5 - 5.1 mmol/L   Chloride 106 98 - 111 mmol/L   CO2 18 (L) 22 - 32 mmol/L   Glucose, Bld 199 (H) 70 - 99  mg/dL   BUN 41 (H) 6 - 20 mg/dL   Creatinine, Ser 5.50 (H) 0.44 - 1.00 mg/dL   Calcium 9.9 8.9 - 10.3 mg/dL   GFR calc non Af Amer 8 (L) >60 mL/min   GFR calc Af Amer 9 (L) >60 mL/min    Comment: (NOTE) The eGFR has been calculated using the CKD EPI equation. This calculation has not been validated in all clinical situations. eGFR's persistently <60 mL/min signify possible Chronic Kidney Disease.    Anion gap 11 5 - 15    Comment: Performed at [facility] [address], [place], [place] [address]  CBC     Status: Abnormal   Collection Time: 12/12/17  1:32 AM  Result Value Ref Range   WBC 8.6 4.0 - 10.5 K/uL   RBC 2.77 (L) 3.87 - 5.11 MIL/uL   Hemoglobin 8.2 (L) 12.0 - 15.0 g/dL   HCT 26.2 (L) 36.0 - 46.0 %   MCV 94.6 80.0 - 100.0 fL   MCH 29.6 26.0 - 34.0 pg   MCHC 31.3 30.0 - 36.0 g/dL   RDW 14.2 11.5 - 15.5 %   Platelets 285 150 - 400 K/uL   nRBC 0.2 0.0 - 0.2 %    Comment: Performed at [facility], [place] [address], [place], [place] [address]  I-stat troponin, ED     Status: None   Collection Time: 12/12/17  1:35 AM  Result Value Ref Range   Troponin i, poc 0.03 0.00 - 0.08 ng/mL   Comment 3            Comment: Due to the release kinetics of cTnI, a negative result within the first hours of the onset of symptoms does not rule out myocardial infarction with certainty. If myocardial infarction is still suspected, repeat the test at appropriate intervals.   I-Stat beta hCG blood, ED     Status: None   Collection Time: 12/12/17  1:44 AM  Result Value Ref Range   I-stat hCG, quantitative <5.0 <5 mIU/mL   Comment 3            Comment:   GEST. AGE      CONC.  (mIU/mL)   <=1 WEEK        5 - 50     2 WEEKS       50 - 500     3 WEEKS       100 - 10,000     4 WEEKS     1,000 - 30,000        FEMALE AND NON-PREGNANT FEMALE:     LESS THAN 5 mIU/mL  D-dimer, quantitative (not at [facility])     Status: Abnormal   Collection Time: 12/12/17  3:57 AM  Result Value Ref  Range   D-Dimer, Quant 2.63 (H) 0.00 - 0.50 ug/mL-FEU    Comment: (NOTE) At the manufacturer cut-off of 0.50 ug/mL FEU, this assay has been documented to exclude PE with a sensitivity and negative predictive value of 97 to 99%.  At this time, this assay has not been approved by the FDA to exclude DVT/VTE. Results should be correlated with clinical presentation. Performed at [facility], [place] [address] [place], [place] [address]   Troponin I-serum (0, 3, 6 hours)     Status: Abnormal   Collection Time: 12/12/17  3:57 AM  Result Value Ref Range   Troponin I 0.03 (HH) <0.03 ng/mL    Comment: CRITICAL RESULT CALLED TO, READ BACK BY AND VERIFIED WITH: MUNNETT,W RN 12/12/2017 0549 JORDANS Performed at [facility], [place] [address], [place], [place] [address]   CBG monitoring, ED     Status: Abnormal   Collection Time: 12/12/17  6:07 AM  Result Value Ref Range   Glucose-Capillary 246 (H) 70 - 99 mg/dL   Comment 1 Notify RN    Comment 2 Document in Chart   Troponin I-serum (0, 3, 6 hours)     Status: Abnormal   Collection Time: 12/12/17  7:59 AM  Result Value Ref Range   Troponin I 0.06 (HH) <0.03 ng/mL    Comment: CRITICAL VALUE NOTED.  VALUE IS CONSISTENT WITH PREVIOUSLY REPORTED AND CALLED VALUE. Performed at [facility], [place] [address], [place], [place] [address]   MRSA PCR Screening     Status: None   Collection Time: 12/12/17  8:08 AM  Result Value Ref Range   MRSA by PCR NEGATIVE NEGATIVE    Comment:        The GeneXpert MRSA Assay (FDA approved for NASAL specimens only), is one component of a comprehensive MRSA colonization surveillance program. It is not intended to diagnose MRSA infection nor to guide or monitor treatment for MRSA infections. Performed at [facility], [place] [address] [place], [place] [address]     Dg Chest 2 View  Result Date: 12/12/2017 CLINICAL DATA:  57 year old female with chest pain and shortness of breath. EXAM:  CHEST - 2 VIEW COMPARISON:  Chest radiograph dated 09/20/2017 FINDINGS: Stable cardiomegaly with mild vascular congestion. No focal consolidation, pleural effusion, or pneumothorax. No acute osseous pathology. IMPRESSION: Cardiomegaly with mild vascular congestion. No focal consolidation. Electronically Signed   By: Anner Crete M.D.   On: 12/12/2017 02:29   Cardiac studies: EKG 12/12/2017: Sinus tachycardia. Low voltage. Occasional PVC.  No ischemic changes   Outpatient echocardiogram 08/31/2016: Hyperdynamic LV, EF >70%. Aortic sclerosis  Nuclear stress test 11/15/2016: Stress EKG changes seen, No reversible ischemia. EF 69%  Renal Dopplers 12/05/2016: No e/o renal artery stenosis. Normal renal size b/l Possibly a simple cyst 3.4X3 cm right kidney  Review of Systems  Constitutional: Negative.   HENT: Negative.   Eyes: Negative.   Respiratory: Positive for shortness of breath.   Cardiovascular: Positive for chest pain.  Gastrointestinal: Negative.   Genitourinary: Negative.   Musculoskeletal: Negative.   Neurological: Negative.   Endo/Heme/Allergies: Does not bruise/bleed easily.  All other systems reviewed and are negative.  Blood pressure 139/78, pulse (!) 106, temperature 98.2 F (36.8 C), temperature source Oral, resp. rate 12, height '5\' 4"'$  (1.626 m), weight 81.2 kg, SpO2  100 %. Physical Exam  Constitutional: She is oriented to person, place, and time. She appears well-developed and well-nourished.  HENT:  Head: Normocephalic and atraumatic.  Neck: No JVD present.  Cardiovascular: Normal rate and regular rhythm.  Murmur heard.  Crescendo decrescendo systolic murmur is present with a grade of 2/6. Pulses:      Carotid pulses are on the right side with bruit, and on the left side with bruit.      Dorsalis pedis pulses are 2+ on the right side, and 2+ on the left side.       Posterior tibial pulses are 2+ on the right side, and 2+ on the left side.  Respiratory:  Effort normal and breath sounds normal. She has no wheezes.  GI: Soft. Bowel sounds are normal. There is no tenderness. There is no rebound.  Musculoskeletal: She exhibits edema (1+ b/l).  Lymphadenopathy:    She has no cervical adenopathy.  Neurological: She is alert and oriented to person, place, and time. No cranial nerve deficit.  Skin: Skin is warm and dry.    Assessment/Recommendations:  57 y/o female with hypertension, type 2 DM, FSGS and CKD V, COPD, HFpEF, aortic sclerosis, admitted with chest pain.  Chest pain, troponin elevation: Likely related to hypertensive urgency in the setting of advanced CKD, although obstructive CAD cannot be excluded. Unfortunately, ischemic workup is limited at this stage due to her advanced CKD-not on dialysis. Continue medical management at this time. Continue amlodipine 10 mg, bisoprolol 20 mg, clonidine 0.2 mg daily. Change hydralazine 25 mg 4 times daily to 50 mg tid.   Acute on chronic diastolic heart failure: Continue aggressive blood pressure control. Continue lasix 40 mg PO daily.  Also, patient underwent VQ scan to rule put PE. Report pending.   Aortic sclerosis: Will obtain echocardiogram.  Parilee Hally J Delmar Dondero 12/12/2017, 10:33 AM    [place], MD [facility]. [place] Pager: [phone] Office: [phone] If no answer Cell [phone]

## 2017-12-12 NOTE — Progress Notes (Signed)
Patient was admitted earlier today with chest pain.    Essentially, Jodi Bell is a 57 y.o. 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  Patient presents to the ED with c/o L sided CP and SOB.  Symptoms onset earlier this evening.  Symptoms moderate to severe.  CP is intermittent, worse with activity better at rest.  Seems to be a "tightness" across her chest.    Last troponin was 0.06.  VQ scan is normal.  Radiology team has been consulted to assist with patient's management.  Further will depend on hospital course.

## 2017-12-12 NOTE — Progress Notes (Signed)
[facility] asked to see as part of CP unit protocol. Dr. Virgina Jock had messaged Nell Range to let her know he planned on seeing as it is a patient of Dr. Einar Gip. We removed from our census. Tawny Raspberry PA-C

## 2017-12-12 NOTE — H&P (Signed)
History and Physical    JESSALYNN MCCOWAN OVF:[id number] DOB: 1961/02/09 DOA: 12/12/2017  PCP: [facility] [place]  Patient coming from: Home  I have personally briefly reviewed patient's old medical records in [place]  Chief Complaint: CP  HPI: Jodi Bell is a 57 y.o. 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  Patient presents to the ED with c/o L sided CP and SOB.  Symptoms onset earlier this evening.  Symptoms moderate to severe.  CP is intermittent, worse with activity better at rest.  Seems to be a "tightness" across her chest.  EMS called.   ED Course: EMS gave ASA, 10mg  albuterol, 1mg  atrovent.  Not clear wether patient was tachycardic before or only after the albuterol but HR initially 140s in ED, now down to 120s.  BP running 841Y systolic.  Tried NTG x2 which dropped BP to 606 systolic, didn't really help the intermittent CP, but HR went up to 130.  Trop initially neg.  D.Dimer pending.  Of note, patient just had TKA earlier this month on 10/3!  She was given lovenox to take home and she completed short course of lovenox post op.   Review of Systems: As per HPI otherwise 10 point review of systems negative.   Past Medical History:  Diagnosis Date  . Anemia   . Arthritis    oa and psoriatic ra  . Asthma   . Back pain, chronic    lower back  . Candida infection finished tx 2 days ago  . Chronic insomnia   . Chronic kidney disease    stage 3 ckd last ov nephrology 04-29-17 epic  . Chronic kidney disease (CKD) stage G4/A1, severely decreased glomerular filtration rate (GFR) between 15-29 mL/min/1.73 square meter and albuminuria creatinine ratio less than 30 mg/g (HCC)   . COPD (chronic obstructive pulmonary disease) ([place])   . Diabetes mellitus    type 2  . Eczema   . Essential hypertension   . GERD (gastroesophageal reflux disease)   . History of hiatal hernia   . Obesity   . Pneumonia few yrs ago x 2     Past Surgical History:  Procedure Laterality Date  . BACK SURGERY  2016   lower back fusion with cage  . BIOPSY  09/23/2017   Procedure: BIOPSY;  Surgeon: Wonda Horner, MD;  Location: [facility];  Service: Endoscopy;;  . CESAREAN SECTION  1990   x 1   . COLONOSCOPY WITH PROPOFOL N/A 09/23/2017   Procedure: COLONOSCOPY WITH PROPOFOL Hemostatic clips placed;  Surgeon: Wonda Horner, MD;  Location: [facility];  Service: Endoscopy;  Laterality: N/A;  . DILATION AND CURETTAGE OF UTERUS  1988  . ESOPHAGOGASTRODUODENOSCOPY (EGD) WITH PROPOFOL N/A 09/23/2017   Procedure: ESOPHAGOGASTRODUODENOSCOPY (EGD) WITH PROPOFOL;  Surgeon: Wonda Horner, MD;  Location: [facility];  Service: Endoscopy;  Laterality: N/A;  . PARTIAL KNEE ARTHROPLASTY Left 11/12/2017   Procedure: left unicompartmental arthroplasty-medial;  Surgeon: Paralee Cancel, MD;  Location: [facility];  Service: Orthopedics;  Laterality: Left;  49min  . SUBMUCOSAL INJECTION  09/23/2017   Procedure: SUBMUCOSAL INJECTION;  Surgeon: Wonda Horner, MD;  Location: [facility];  Service: Endoscopy;;  in colon  . TUBAL LIGATION  1990     reports that she has been smoking cigarettes. She has a 20.00 pack-year smoking history. She has never used smokeless tobacco. She reports that she drinks alcohol. She reports that she does not  use drugs.  Allergies  Allergen Reactions  . Adalimumab Other (See Comments)    Blisters on abdomen =humira  . Ibuprofen Other (See Comments)    Renal Problems  . Leflunomide And Related Other (See Comments)    Severe Headache  . Lexapro [Escitalopram Oxalate] Other (See Comments)    Hand problems - Serotonin Syndrome   . Lisinopril Swelling    Angioedema   . Other     Cosentyx= angioedema   . Pistachio Nut (Diagnostic)     Mouth itches and tongue swelling Hazel nuts and pecans also  . Statins Other (See Comments)    Leg pains and weakness  . Trazodone And Nefazodone Other (See Comments)    Serotonin  Syndrome     Family History  Problem Relation Age of Onset  . Lung cancer Mother   . Diabetes Sister   . Heart disease Sister   . Thyroid cancer Unknown        1/2 sister  . Heart disease Unknown        1/2 sister     Prior to Admission medications   Medication Sig Start Date End Date Taking? Authorizing Provider  allopurinol (ZYLOPRIM) 300 MG tablet Take 150 mg by mouth daily.    Yes [provider]  amLODipine (NORVASC) 10 MG tablet Take 10 mg by mouth daily.   Yes [provider]  bisoprolol (ZEBETA) 10 MG tablet Take 20 mg by mouth daily.    Yes [provider]  cetirizine (ZYRTEC) 10 MG tablet Take 20 mg by mouth 2 (two) times daily.  06/13/17  Yes Shelda Pal, DO  cloNIDine (CATAPRES) 0.2 MG tablet Take 0.2 mg by mouth 2 (two) times daily.  07/15/15  Yes [provider]  diclofenac sodium (VOLTAREN) 1 % GEL Apply 2 g topically daily as needed (joint pain).    Yes [provider]  EPINEPHrine (EPIPEN) 0.3 mg/0.3 mL SOAJ injection Inject 0.3 mLs (0.3 mg total) into the muscle once. Patient taking differently: Inject 0.3 mg into the muscle as needed (for allergic reaction).  09/30/12  Yes Le, Thao P, DO  esomeprazole (NEXIUM) 20 MG capsule Take 20 mg by mouth every evening.    Yes [provider]  ferrous sulfate 325 (65 FE) MG tablet Take 325 mg by mouth daily.    Yes [provider]  fluticasone (FLONASE) 50 MCG/ACT nasal spray Place 1 spray into both nostrils daily.    Yes [provider]  folic acid (FOLVITE) 665 MCG tablet Take 400 mcg by mouth daily.    Yes [provider]  furosemide (LASIX) 40 MG tablet Take 40 mg by mouth daily.   Yes [provider]  gabapentin (NEURONTIN) 300 MG capsule Take 600 mg by mouth at bedtime as needed (nerve pain).    Yes [provider]  hydrALAZINE (APRESOLINE) 25 MG tablet Take 25 mg by mouth 4 (four) times daily.  06/26/16  Yes [provider]  Insulin Degludec (TRESIBA FLEXTOUCH) 200 UNIT/ML SOPN Inject 14 Units into the skin every evening.    Yes [provider]  JANUVIA 100 MG tablet Take 100 mg by mouth daily.  08/09/15  Yes [provider]  methocarbamol (ROBAXIN) 500 MG tablet Take 1 tablet (500 mg total) by mouth every 6 (six) hours as needed for muscle spasms. 11/13/17  Yes Constable, Amber, PA-C  montelukast (SINGULAIR) 10 MG tablet Take 10 mg by mouth at bedtime.   Yes [provider]  Multiple Vitamin (MULTIVITAMIN) tablet Take 1 tablet by mouth daily.   Yes [provider]  Omega 3 1000 MG CAPS Take 1,000 mg by mouth daily.   Yes [provider]  oxyCODONE 10 MG TABS Take 1-2 tablets (10-20 mg total) by mouth every 6 (six) hours as needed for severe pain (pain score 7-10). 11/13/17  Yes Constable, Amber, PA-C  predniSONE (DELTASONE) 2.5 MG tablet Take 2.5 mg by mouth daily with breakfast.   Yes [provider]  Probiotic Product (PROBIOTIC DAILY PO) Take 1 tablet by mouth daily.   Yes [provider]  promethazine (PHENERGAN) 12.5 MG tablet Take 12.5 mg by mouth daily as needed for nausea or vomiting.  08/17/17  Yes [provider]  ranitidine (ZANTAC) 300 MG tablet Take 300 mg by mouth 2 (two) times daily. 08/15/15  Yes [provider]  senna-docusate (PERI-COLACE) 8.6-50 MG tablet Take 2 tablets by mouth at bedtime.    Yes [provider]  VENTOLIN HFA 108 (90 Base) MCG/ACT inhaler Inhale 1-2 puffs into the lungs every 6 (six) hours as needed for shortness of breath. wheezing 06/13/15  Yes [provider]  Vitamin D, Ergocalciferol, (DRISDOL) 50000 units CAPS capsule Take 50,000 Units by mouth every Tuesday.  09/15/17  Yes [provider]  zolpidem (AMBIEN) 10 MG tablet Take 10 mg by mouth at bedtime.    Yes [provider]    Physical Exam: Vitals:   12/12/17 0345 12/12/17 0346 12/12/17 0351 12/12/17  0356  BP: (!) 145/72 (!) 146/88 (!) 137/92 (!) 144/79  Pulse: (!) 123 (!) 121 (!) 124 (!) 127  Resp: 19 (!) 24 (!) 26 (!) 25  Temp:      TempSrc:      SpO2: 98% 98% 99% 99%  Weight:      Height:        Constitutional: NAD, calm, comfortable Eyes: PERRL, lids and conjunctivae normal ENMT: Mucous membranes are moist. Posterior pharynx clear of any exudate or lesions.Normal dentition.  Neck: normal, supple, no masses, no thyromegaly Respiratory: clear to auscultation bilaterally, no wheezing, no crackles. Normal respiratory effort. No accessory muscle use.  Cardiovascular: Tachycardic, regular Abdomen: no tenderness, no masses palpated. No hepatosplenomegaly. Bowel sounds positive.  Musculoskeletal: no clubbing / cyanosis. No joint deformity upper and lower extremities. Good ROM, no contractures. Normal muscle tone.  Skin: no rashes, lesions, ulcers. No induration Neurologic: CN 2-12 grossly intact. Sensation intact, DTR normal. Strength 5/5 in all 4.  Psychiatric: Normal judgment and insight. Alert and oriented x 3. Normal mood.    Labs on Admission: I have personally reviewed following labs and imaging studies  CBC: Recent Labs  Lab 12/12/17 0132  WBC 8.6  HGB 8.2*  HCT 26.2*  MCV 94.6  PLT 875   Basic Metabolic Panel: Recent Labs  Lab 12/12/17 0132  NA 135  K 3.5  CL 106  CO2 18*  GLUCOSE 199*  BUN 41*  CREATININE 5.50*  CALCIUM 9.9   GFR: Estimated Creatinine Clearance: 11.6 mL/min (A) (by C-G formula based on SCr of 5.5 mg/dL (H)). Liver Function Tests: No results for input(s): AST, ALT, ALKPHOS, BILITOT, PROT, ALBUMIN in the last 168 hours. No results for input(s): LIPASE, AMYLASE in the last 168 hours. No results for input(s): AMMONIA in the last 168 hours. Coagulation Profile: No results for input(s): INR, PROTIME in the last 168 hours. Cardiac Enzymes: No results for input(s): CKTOTAL, CKMB, CKMBINDEX, TROPONINI in the last  168 hours. BNP (last 3  results) No results for input(s): PROBNP in the last 8760 hours. HbA1C: No results for input(s): HGBA1C in the last 72 hours. CBG: No results for input(s): GLUCAP in the last 168 hours. Lipid Profile: No results for input(s): CHOL, HDL, LDLCALC, TRIG, CHOLHDL, LDLDIRECT in the last 72 hours. Thyroid Function Tests: No results for input(s): TSH, T4TOTAL, FREET4, T3FREE, THYROIDAB in the last 72 hours. Anemia Panel: No results for input(s): VITAMINB12, FOLATE, FERRITIN, TIBC, IRON, RETICCTPCT in the last 72 hours. Urine analysis:    Component Value Date/Time   COLORURINE YELLOW 08/17/2015 1621   APPEARANCEUR CLEAR 08/17/2015 1621   LABSPEC 1.015 08/17/2015 1621   PHURINE 5.5 08/17/2015 1621   GLUCOSEU NEGATIVE 08/17/2015 1621   HGBUR NEGATIVE 08/17/2015 1621   BILIRUBINUR NEGATIVE 08/17/2015 1621   KETONESUR NEGATIVE 08/17/2015 1621   PROTEINUR 100 (A) 08/17/2015 1621   UROBILINOGEN 0.2 09/05/2014 0013   NITRITE NEGATIVE 08/17/2015 1621   LEUKOCYTESUR NEGATIVE 08/17/2015 1621    Radiological Exams on Admission: Dg Chest 2 View  Result Date: 12/12/2017 CLINICAL DATA:  57 year old female with chest pain and shortness of breath. EXAM: CHEST - 2 VIEW COMPARISON:  Chest radiograph dated 09/20/2017 FINDINGS: Stable cardiomegaly with mild vascular congestion. No focal consolidation, pleural effusion, or pneumothorax. No acute osseous pathology. IMPRESSION: Cardiomegaly with mild vascular congestion. No focal consolidation. Electronically Signed   By: Anner Crete M.D.   On: 12/12/2017 02:29    EKG: Independently reviewed.  Assessment/Plan Principal Problem:   Chest pain, rule out acute myocardial infarction Active Problems:   Type 2 diabetes mellitus (HCC)   Essential hypertension   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 2 diabetes mellitus ([place])    1. CP - need to r/o ACS and PE, dissection felt less likely 1. The intermittent CP across chest seems more suspicious for PE than ACS 1. The  h/o knee replacement surgery this month also raises suspicion for PE 2. CP obs pathway 3. Serial trops 4. Tele monitor 5. D.Dimer pending - if positive will put on heparin gtt and would need VQ scan in AM 6. If rules out for PE needs cards eval in AM 7. NPO 2. HTN - 1. Continue home BP meds 3. DM2 - 1. Sensitive SSI Q4H 4. CKD stage 5 - 1. Chronic and stable 2. Continue lasix 40  DVT prophylaxis: Heparin [place] (for now) Code Status: Full Family Communication: Son at bedside Disposition Plan: Home after admit Consults called: None, call cards in AM if rules out for PE Admission status: Place in [place], [place] [facility] Pager [phone] Only works nights!  If 7AM-7PM, please contact the primary day team physician taking care of patient  [email or web address] Password [email or web address]  12/12/2017, 3:59 AM

## 2017-12-12 NOTE — Progress Notes (Signed)
  Echocardiogram 2D Echocardiogram has been performed.  Jodi Bell M 12/12/2017, 1:18 PM

## 2017-12-12 NOTE — ED Triage Notes (Signed)
BIB [facility] from home with c/o of SOB with left sided chest pain. Received 10mg  albuterol, 1mg  of Atrovent, and 324mg  aspirin in route. Hx of COPD, Asthma and CKD.

## 2017-12-12 NOTE — ED Provider Notes (Signed)
TIME SEEN: 2:32 AM  CHIEF COMPLAINT: Chest pain  HPI: Patient is a 57 year old female with history of diabetes, hypertension, hyperlipidemia, COPD, chronic kidney disease not yet on dialysis, previous tobacco use who quit 3 weeks ago who presents to the emergency department with chest pain and shortness of breath.  Describes the chest pain is a diffuse anterior tightness without radiation.  Started around 10 AM yesterday morning and progressively got worse.  It is worse with exertion.  States she thinks her last stress test was in July 2017.  No history of MI or cardiac catheterization.  No history of PE or DVT.  She did recently have a left knee replacement the beginning of October.  No calf swelling or calf tenderness.  Was given albuterol by EMS prior to arrival.  ROS: See HPI Constitutional: no fever  Eyes: no drainage  ENT: no runny nose   Cardiovascular:   chest pain  Resp:  SOB  GI: no vomiting GU: no dysuria Integumentary: no rash  Allergy: no hives  Musculoskeletal: no leg swelling  Neurological: no slurred speech ROS otherwise negative  PAST MEDICAL HISTORY/PAST SURGICAL HISTORY:  Past Medical History:  Diagnosis Date  . Anemia   . Arthritis    oa and psoriatic ra  . Asthma   . Back pain, chronic    lower back  . Candida infection finished tx 2 days ago  . Chronic insomnia   . Chronic kidney disease    stage 3 ckd last ov nephrology 04-29-17 epic  . Chronic kidney disease (CKD) stage G4/A1, severely decreased glomerular filtration rate (GFR) between 15-29 mL/min/1.73 square meter and albuminuria creatinine ratio less than 30 mg/g (HCC)   . COPD (chronic obstructive pulmonary disease) ([place])   . Diabetes mellitus    type 2  . Eczema   . Essential hypertension   . GERD (gastroesophageal reflux disease)   . History of hiatal hernia   . Obesity   . Pneumonia few yrs ago x 2    MEDICATIONS:  Prior to Admission medications   Medication Sig Start Date End Date Taking?  Authorizing Provider  allopurinol (ZYLOPRIM) 300 MG tablet Take 150 mg by mouth daily.     [provider]  amLODipine (NORVASC) 10 MG tablet Take 10 mg by mouth daily.    [provider]  bisoprolol (ZEBETA) 10 MG tablet Take 20 mg by mouth daily.     [provider]  cetirizine (ZYRTEC) 10 MG tablet Take 20 mg by mouth 2 (two) times daily.  06/13/17   Shelda Pal, DO  cloNIDine (CATAPRES) 0.2 MG tablet Take 0.2 mg by mouth 2 (two) times daily.  07/15/15   [provider]  diclofenac sodium (VOLTAREN) 1 % GEL Apply 2 g topically daily as needed (joint pain).     [provider]  enoxaparin (LOVENOX) 30 MG/0.3ML injection Inject 0.3 mLs (30 mg total) into the skin daily. 11/13/17   Constable, Amber, PA-C  EPINEPHrine (EPIPEN) 0.3 mg/0.3 mL SOAJ injection Inject 0.3 mLs (0.3 mg total) into the muscle once. 09/30/12   Le, Thao P, DO  esomeprazole (NEXIUM) 20 MG capsule Take 20 mg by mouth every evening.     [provider]  ferrous sulfate 325 (65 FE) MG tablet Take 325 mg by mouth daily.     [provider]  fluticasone (FLONASE) 50 MCG/ACT nasal spray Place 1 spray into both nostrils daily.     [provider]  folic acid (  FOLVITE) 800 MCG tablet Take 400 mcg by mouth daily.     [provider]  furosemide (LASIX) 40 MG tablet Take 40 mg by mouth daily.    [provider]  gabapentin (NEURONTIN) 300 MG capsule Take 600 mg by mouth at bedtime as needed (nerve pain).     [provider]  hydrALAZINE (APRESOLINE) 25 MG tablet Take 25 mg by mouth 4 (four) times daily.  06/26/16   [provider]  Insulin Degludec (TRESIBA FLEXTOUCH) 200 UNIT/ML SOPN Inject 14 Units into the skin every evening.     [provider]  JANUVIA 100 MG tablet Take 100 mg by mouth daily.  08/09/15   [provider]  methocarbamol (ROBAXIN) 500 MG tablet Take 1 tablet (500 mg total) by mouth every 6  (six) hours as needed for muscle spasms. 11/13/17   Constable, Amber, PA-C  montelukast (SINGULAIR) 10 MG tablet Take 10 mg by mouth at bedtime.    [provider]  Multiple Vitamin (MULTIVITAMIN) tablet Take 1 tablet by mouth daily.    [provider]  Omega 3 1000 MG CAPS Take 1,000 mg by mouth daily.    [provider]  oxyCODONE 10 MG TABS Take 1-2 tablets (10-20 mg total) by mouth every 6 (six) hours as needed for severe pain (pain score 7-10). 11/13/17   Constable, Amber, PA-C  predniSONE (DELTASONE) 2.5 MG tablet Take 2.5 mg by mouth daily with breakfast.    [provider]  Probiotic Product (PROBIOTIC DAILY PO) Take 1 tablet by mouth daily.    [provider]  promethazine (PHENERGAN) 12.5 MG tablet Take 12.5 mg by mouth daily as needed for nausea or vomiting.  08/17/17   [provider]  ranitidine (ZANTAC) 300 MG tablet Take 300 mg by mouth 2 (two) times daily. 08/15/15   [provider]  senna-docusate (PERI-COLACE) 8.6-50 MG tablet Take 2 tablets by mouth at bedtime.     [provider]  VENTOLIN HFA 108 (90 Base) MCG/ACT inhaler Inhale 1-2 puffs into the lungs every 6 (six) hours as needed for shortness of breath. wheezing 06/13/15   [provider]  Vitamin D, Ergocalciferol, (DRISDOL) 50000 units CAPS capsule Take 50,000 Units by mouth every Tuesday.  09/15/17   [provider]  zolpidem (AMBIEN) 10 MG tablet Take 10 mg by mouth at bedtime.     [provider]    ALLERGIES:  Allergies  Allergen Reactions  . Adalimumab Other (See Comments)    Blisters on abdomen =humira  . Ibuprofen Other (See Comments)    Renal Problems  . Leflunomide And Related Other (See Comments)    Severe Headache  . Lexapro [Escitalopram Oxalate] Other (See Comments)    Hand problems - Serotonin Syndrome   . Lisinopril Swelling    Angioedema   . Other     Cosentyx= angioedema   . Pistachio Nut (Diagnostic)      Mouth itches and tongue swelling Hazel nuts and pecans also  . Statins Other (See Comments)    Leg pains and weakness  . Trazodone And Nefazodone Other (See Comments)    Serotonin Syndrome     SOCIAL HISTORY:  Social History   Tobacco Use  . Smoking status: Current Every Day Smoker    Packs/day: 0.50    Years: 40.00    Pack years: 20.00    Types: Cigarettes    Last attempt to quit: 02/03/2014    Years since quitting: 3.8  .  Smokeless tobacco: Never Used  . Tobacco comment: Pt quit  a week ago, per 11-06-17 PAT appt  Substance Use Topics  . Alcohol use: Yes    Comment: occ    FAMILY HISTORY: Family History  Problem Relation Age of Onset  . Lung cancer Mother   . Diabetes Sister   . Heart disease Sister   . Thyroid cancer Unknown        1/2 sister  . Heart disease Unknown        1/2 sister    EXAM: BP (!) 163/74   Pulse (!) 126   Temp 98.7 F (37.1 C) (Oral)   Resp 19   Ht 5\' 4"  (1.626 m)   Wt 81.2 kg   SpO2 100%   BMI 30.73 kg/m  CONSTITUTIONAL: Alert and oriented and responds appropriately to questions. Well-appearing; well-nourished HEAD: Normocephalic EYES: Conjunctivae clear, pupils appear equal, EOMI ENT: normal nose; moist mucous membranes NECK: Supple, no meningismus, no nuchal rigidity, no LAD  CARD: Regular and tachycardic; S1 and S2 appreciated; no murmurs, no clicks, no rubs, no gallops RESP: Normal chest excursion without splinting or tachypnea; breath sounds clear and equal bilaterally; no wheezes, no rhonchi, no rales, no hypoxia or respiratory distress, speaking full sentences ABD/GI: Normal bowel sounds; non-distended; soft, non-tender, no rebound, no guarding, no peritoneal signs, no hepatosplenomegaly BACK:  The back appears normal and is non-tender to palpation, there is no CVA tenderness EXT: Normal ROM in all joints; non-tender to palpation; no edema; normal capillary refill; no cyanosis, no calf tenderness or swelling    SKIN: Normal  color for age and race; warm; no rash NEURO: Moves all extremities equally PSYCH: The patient's mood and manner are appropriate. Grooming and personal hygiene are appropriate.  MEDICAL DECISION MAKING: Patient here with complaints of exertional chest pain and shortness of breath.  She has multiple risk factors for ACS.  She is extremely tachycardic here which may be secondary to recent albuterol use with EMS.  Her lungs are currently clear.  She does have history of COPD but I am not convinced that this is a COPD exacerbation.  Her rectal temperature is 99.6 today.  Will give dose of Tylenol in case she has the beginning of a fever that could be causing some of her tachycardia.  I am also concerned for possible pulmonary embolus given she recently had a knee replacement.  Unable to obtain a CTA of her chest given her history of chronic kidney disease.  Will obtain d-dimer.  First troponin is negative.  Chest x-ray shows cardiomegaly with vascular congestion but no edema.  Will discuss with medicine for admission.  Will give aspirin, nitroglycerin.  ED PROGRESS:   3:23 AM Discussed patient's case with hospitalist, Dr. Alcario Drought.  I have recommended admission and patient (and family if present) agree with this plan. Admitting physician will place admission orders.   I reviewed all nursing notes, vitals, pertinent previous records, EKGs, lab and urine results, imaging (as available).    Patient's d-dimer is positive.  Hospitalist has ordered a VQ scan and started patient on heparin.  Heart rate is slowly improving.   EKG Interpretation  Date/Time:  Saturday December 12 2017 02:06:52 EDT Ventricular Rate:  128 PR Interval:    QRS Duration: 88 QT Interval:  323 QTC Calculation: 472 R Axis:   -20 Text Interpretation:  Sinus tachycardia Ventricular premature complex Borderline left axis deviation Low voltage, precordial leads Rate faster than previous Confirmed by Jermaine Tholl, Cyril Mourning (  [id number]) on 12/12/2017  2:29:05 AM        CRITICAL CARE Performed by: Cyril Mourning Kierstan Auer   Total critical care time: 45 minutes  Critical care time was exclusive of separately billable procedures and treating other patients.  Critical care was necessary to treat or prevent imminent or life-threatening deterioration.  Critical care was time spent personally by me on the following activities: development of treatment plan with patient and/or surrogate as well as nursing, discussions with consultants, evaluation of patient's response to treatment, examination of patient, obtaining history from patient or surrogate, ordering and performing treatments and interventions, ordering and review of laboratory studies, ordering and review of radiographic studies, pulse oximetry and re-evaluation of patient's condition.       Meghna Hagmann, Delice Bison, DO 12/12/17 [phone]

## 2017-12-13 DIAGNOSIS — R079 Chest pain, unspecified: Secondary | ICD-10-CM | POA: Diagnosis not present

## 2017-12-13 LAB — CBC
HCT: 22.8 % — ABNORMAL LOW (ref 36.0–46.0)
Hemoglobin: 7.1 g/dL — ABNORMAL LOW (ref 12.0–15.0)
MCH: 29.6 pg (ref 26.0–34.0)
MCHC: 31.1 g/dL (ref 30.0–36.0)
MCV: 95 fL (ref 80.0–100.0)
Platelets: 270 K/uL (ref 150–400)
RBC: 2.4 MIL/uL — ABNORMAL LOW (ref 3.87–5.11)
RDW: 14.1 % (ref 11.5–15.5)
WBC: 7.4 K/uL (ref 4.0–10.5)
nRBC: 0 % (ref 0.0–0.2)

## 2017-12-13 LAB — IRON AND TIBC
Iron: 100 ug/dL (ref 28–170)
Saturation Ratios: 54 % — ABNORMAL HIGH (ref 10.4–31.8)
TIBC: 186 ug/dL — ABNORMAL LOW (ref 250–450)
UIBC: 86 ug/dL

## 2017-12-13 LAB — GLUCOSE, CAPILLARY
Glucose-Capillary: 119 mg/dL — ABNORMAL HIGH (ref 70–99)
Glucose-Capillary: 207 mg/dL — ABNORMAL HIGH (ref 70–99)
Glucose-Capillary: 111 mg/dL — ABNORMAL HIGH (ref 70–99)
Glucose-Capillary: 108 mg/dL — ABNORMAL HIGH (ref 70–99)

## 2017-12-13 LAB — TROPONIN I: Troponin I: 0.04 ng/mL (ref <0.03)

## 2017-12-13 LAB — HEMOGLOBIN AND HEMATOCRIT, BLOOD
HCT: 25.6 % — ABNORMAL LOW (ref 36.0–46.0)
Hemoglobin: 7.7 g/dL — ABNORMAL LOW (ref 12.0–15.0)

## 2017-12-13 LAB — FERRITIN: Ferritin: 109 ng/mL (ref 11–307)

## 2017-12-13 LAB — PREPARE RBC (CROSSMATCH)

## 2017-12-13 LAB — HEPARIN LEVEL (UNFRACTIONATED): Heparin Unfractionated: 0.38 IU/mL (ref 0.30–0.70)

## 2017-12-13 MED ORDER — FUROSEMIDE 10 MG/ML IJ SOLN
100.0000 mg | Freq: Once | INTRAVENOUS | Status: AC
  Administered 2017-12-13: 100 mg via INTRAVENOUS
  Filled 2017-12-13: qty 10

## 2017-12-13 MED ORDER — SODIUM CHLORIDE 0.9% IV SOLUTION
Freq: Once | INTRAVENOUS | Status: AC
  Administered 2017-12-13: 18:00:00 via INTRAVENOUS

## 2017-12-13 NOTE — Progress Notes (Signed)
Dr. Marthenia Rolling notified repeat H/H 7.7/25.6.

## 2017-12-13 NOTE — Progress Notes (Signed)
PROGRESS NOTE    Jodi Bell  KNL:[id number] DOB: 01/26/1961 DOA: 12/12/2017 PCP: [facility]  Outpatient Specialists:   Brief Narrative:  Jodi Bell a 57 y.o.femalewith medical history significant ofCKD stage 5 not on dialysis, HTN, DM2, asthma. Patient is admitted with chest pain and significant anemia. Troponin on presentation was 0.06.  VQ scan was normal. NSTEMI is being managed medically. Patient will be transfused with 2 units of PRBC. Will check iron studies.  Patient will follow up with Nephrology on discharge for likely EPO treatment.  BP was noted to be uncontrolled on presentation.  Assessment & Plan:   Principal Problem:   Chest pain, rule out acute myocardial infarction Active Problems:   Type 2 diabetes mellitus ([place])   Essential hypertension   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 2 diabetes mellitus (HCC)  Chest pain (NSTEMI): Troponin has been trended.   Troponin is currently stable.   No chest pain for today.   Patient is still on heparin drip.   Cardiology is directing care. VQ scan was not suggestive of PE. Significant anemia noted.  Anemia: Likely multifactorial. Patient has advanced CKD. Will check iron level. We will hold checking EPO level for now. Transfuse 2 units of packed red blood cells. High-dose IV Lasix after first unit of packed red blood cell transfusion. Low-dose to infuse iron. Follow with nephrology on discharge for possible EPO  Uncontrolled hypertension:  Continue to optimize.   Blood pressure control has improved significantly  DM2 - Sensitive SSI   CKD stage 5: Chronic and stable Follow up with Nephrology on discharge for management of advanced CKD. Likely EPO therapy on discharge.   DVT prophylaxis: Heparin   Code Status: Full Family Communication: Son at bedside Disposition Plan: Home in am Consults called: Cardiology  Procedures:   None  Antimicrobials:   None   Subjective: Denies chest pain  today.  Objective: Vitals:   12/13/17 0831 12/13/17 1000 12/13/17 1200 12/13/17 1317  BP: (!) 161/84 (!) 146/88 125/76 133/80  Pulse:  71 71   Resp:  14 19   Temp:      TempSrc:      SpO2:  98% 98%   Weight:      Height:        Intake/Output Summary (Last 24 hours) at 12/13/2017 1533 Last data filed at 12/13/2017 1438 Gross per 24 hour  Intake 1375.26 ml  Output 2750 ml  Net -1374.74 ml   Filed Weights   12/12/17 0227  Weight: 81.2 kg    Examination:  General exam: Appears calm and comfortable.  Pale.  Puffiness of the eyes. Respiratory system: Clear to auscultation. Respiratory effort normal. Cardiovascular system: S1 & S2 heard Gastrointestinal system: Abdomen is obese, soft and nontender. No organomegaly or masses felt. Normal bowel sounds heard. Central nervous system: Alert and oriented. No focal neurological deficits. Psychiatry: Judgement and insight appear normal. Mood & affect appropriate.   Data Reviewed: I have personally reviewed following labs and imaging studies  CBC: Recent Labs  Lab 12/12/17 0132 12/13/17 0359 12/13/17 1316  WBC 8.6 7.4  --   HGB 8.2* 7.1* 7.7*  HCT 26.2* 22.8* 25.6*  MCV 94.6 95.0  --   PLT 285 270  --    Basic Metabolic Panel: Recent Labs  Lab 12/12/17 0132  NA 135  K 3.5  CL 106  CO2 18*  GLUCOSE 199*  BUN 41*  CREATININE 5.50*  CALCIUM 9.9   GFR: Estimated Creatinine  Clearance: 11.6 mL/min (A) (by C-G formula based on SCr of 5.5 mg/dL (H)). Liver Function Tests: No results for input(s): AST, ALT, ALKPHOS, BILITOT, PROT, ALBUMIN in the last 168 hours. No results for input(s): LIPASE, AMYLASE in the last 168 hours. No results for input(s): AMMONIA in the last 168 hours. Coagulation Profile: No results for input(s): INR, PROTIME in the last 168 hours. Cardiac Enzymes: Recent Labs  Lab 12/12/17 0357 12/12/17 0759 12/12/17 1347 12/13/17 0359  TROPONINI 0.03* 0.06* 0.06* 0.04*   BNP (last 3 results) No  results for input(s): PROBNP in the last 8760 hours. HbA1C: No results for input(s): HGBA1C in the last 72 hours. CBG: Recent Labs  Lab 12/12/17 1236 12/12/17 1658 12/12/17 2002 12/13/17 0747 12/13/17 1138  GLUCAP 113* 157* 138* 119* 207*   Lipid Profile: No results for input(s): CHOL, HDL, LDLCALC, TRIG, CHOLHDL, LDLDIRECT in the last 72 hours. Thyroid Function Tests: No results for input(s): TSH, T4TOTAL, FREET4, T3FREE, THYROIDAB in the last 72 hours. Anemia Panel: No results for input(s): VITAMINB12, FOLATE, FERRITIN, TIBC, IRON, RETICCTPCT in the last 72 hours. Urine analysis:    Component Value Date/Time   COLORURINE YELLOW 08/17/2015 1621   APPEARANCEUR CLEAR 08/17/2015 1621   LABSPEC 1.015 08/17/2015 1621   PHURINE 5.5 08/17/2015 1621   GLUCOSEU NEGATIVE 08/17/2015 1621   HGBUR NEGATIVE 08/17/2015 1621   BILIRUBINUR NEGATIVE 08/17/2015 1621   KETONESUR NEGATIVE 08/17/2015 1621   PROTEINUR 100 (A) 08/17/2015 1621   UROBILINOGEN 0.2 09/05/2014 0013   NITRITE NEGATIVE 08/17/2015 1621   LEUKOCYTESUR NEGATIVE 08/17/2015 1621   Sepsis Labs: @LABRCNTIP (procalcitonin:4,lacticidven:4)  ) Recent Results (from the past 240 hour(s))  MRSA PCR Screening     Status: None   Collection Time: 12/12/17  8:08 AM  Result Value Ref Range Status   MRSA by PCR NEGATIVE NEGATIVE Final    Comment:        The GeneXpert MRSA Assay (FDA approved for NASAL specimens only), is one component of a comprehensive MRSA colonization surveillance program. It is not intended to diagnose MRSA infection nor to guide or monitor treatment for MRSA infections. Performed at [facility], [place] [address] [place], [place] [address]          Radiology Studies: Dg Chest 2 View  Result Date: 12/12/2017 CLINICAL DATA:  57 year old female with chest pain and shortness of breath. EXAM: CHEST - 2 VIEW COMPARISON:  Chest radiograph dated 09/20/2017 FINDINGS: Stable cardiomegaly with mild  vascular congestion. No focal consolidation, pleural effusion, or pneumothorax. No acute osseous pathology. IMPRESSION: Cardiomegaly with mild vascular congestion. No focal consolidation. Electronically Signed   By: Anner Crete M.D.   On: 12/12/2017 02:29   Nm Pulmonary Perf And Vent  Result Date: 12/12/2017 CLINICAL DATA:  Chest pain and shortness of breath, former smoker resume recently quit, history COPD, hypertension, diabetes mellitus, chronic kidney disease but not yet on dialysis, suspected pulmonary embolism EXAM: NUCLEAR MEDICINE VENTILATION - PERFUSION LUNG SCAN TECHNIQUE: Ventilation images were obtained in multiple projections using inhaled aerosol Tc-19m DTPA. Perfusion images were obtained in multiple projections after intravenous injection of Tc-73m-MAA. RADIOPHARMACEUTICALS:  31.7 mCi of Tc-90m DTPA aerosol inhalation and 4.35 mCi Tc93m-MAA IV COMPARISON:  None Correlation: Chest radiograph 12/12/2017 FINDINGS: Ventilation: Normal Perfusion: Normal IMPRESSION: Normal ventilation and perfusion lung scan. Electronically Signed   By: Lavonia Dana M.D.   On: 12/12/2017 12:10        Scheduled Meds: . sodium chloride   Intravenous Once  .  allopurinol  150 mg Oral Daily  . amLODipine  10 mg Oral Daily  . aspirin  324 mg Oral Once  . bisoprolol  20 mg Oral Daily  . cloNIDine  0.2 mg Oral BID  . ferrous sulfate  325 mg Oral Daily  . fluticasone  1 spray Each Nare Daily  . folic acid  497 mcg Oral Daily  . furosemide  40 mg Oral Daily  . hydrALAZINE  50 mg Oral Q8H  . insulin aspart  0-9 Units Subcutaneous TID WC  . montelukast  10 mg Oral QHS  . multivitamin with minerals  1 tablet Oral Daily  . pantoprazole  40 mg Oral QHS  . predniSONE  2.5 mg Oral Q breakfast  . senna-docusate  2 tablet Oral QHS  . zolpidem  5 mg Oral QHS   Continuous Infusions: . furosemide    . heparin 1,450 Units/hr (12/13/17 1438)     LOS: 0 days    Time spent: 25 minutes.    Dana Allan, MD  [facility] Pager #: [phone] 7PM-7AM contact night coverage as above

## 2017-12-13 NOTE — Progress Notes (Signed)
Subjective:  Feels better. Occasional short lasting episodes of chest pain on 12/12/17 Normal VQ scan  Objective:  Vital Signs in the last 24 hours: Temp:  [97.9 F (36.6 C)-98.1 F (36.7 C)] 97.9 F (36.6 C) (11/03 0449) Pulse Rate:  [62-87] 75 (11/03 0449) Resp:  [11-17] 12 (11/03 0449) BP: (139-161)/(78-95) 161/84 (11/03 0831) SpO2:  [96 %-98 %] 98 % (11/03 0449)  Intake/Output from previous day: 11/02 0701 - 11/03 0700 In: 611.1 [P.O.:360; I.V.:251.1] Out: 1900 [Urine:1900] Intake/Output from this shift: Total I/O In: 330.8 [P.O.:240; I.V.:90.8] Out: -   Physical Exam: Constitutional: She is oriented to person, place, and time. She appears well-developed and well-nourished.  HENT:  Head: Normocephalic and atraumatic.  Neck: No JVD present.  Cardiovascular: Normal rate and regular rhythm.  Murmur heard.  Crescendo decrescendo systolic murmur is present with a grade of 2/6. Pulses:      Carotid pulses are on the right side with bruit, and on the left side with bruit.      Dorsalis pedis pulses are 2+ on the right side, and 2+ on the left side.       Posterior tibial pulses are 2+ on the right side, and 2+ on the left side.  Respiratory: Effort normal and breath sounds normal. She has no wheezes.  GI: Soft. Bowel sounds are normal. There is no tenderness. There is no rebound.  Musculoskeletal: She exhibits trace edema  Lymphadenopathy:    She has no cervical adenopathy.  Neurological: She is alert and oriented to person, place, and time. No cranial nerve deficit.  Skin: Skin is warm and dry.   Lab Results: Recent Labs    12/12/17 0132 12/13/17 0359  WBC 8.6 7.4  HGB 8.2* 7.1*  PLT 285 270   Recent Labs    12/12/17 0132  NA 135  K 3.5  CL 106  CO2 18*  GLUCOSE 199*  BUN 41*  CREATININE 5.50*   Recent Labs    12/12/17 0759 12/12/17 1347  TROPONINI 0.06* 0.06*   Cardiac studies: EKG 12/12/2017: Sinus tachycardia. Low voltage. Occasional PVC.  No  ischemic changes  Hospital echocardiogram 12/12/2017: - Left ventricle: The cavity size was normal. There was mild focal   basal hypertrophy of the septum. Systolic function was normal.   The estimated ejection fraction was in the range of 55% to 60%.   Wall motion was normal; there were no regional wall motion   abnormalities. Doppler parameters are consistent with abnormal   left ventricular relaxation (grade 1 diastolic dysfunction). - Aortic valve: Mildly thickened leaflets. No significant valvular   stenosis. Increased LVOT velocity and murmur likely due to basal   septal hypertrophy. - Mitral valve: There was mild to moderate regurgitation. - Left atrium: The atrium was mildly dilated.  Nuclear stress test 11/15/2016: Stress EKG changes seen, No reversible ischemia. EF 69%  Renal Dopplers 12/05/2016: No e/o renal artery stenosis. Normal renal size b/l Possibly a simple cyst 3.4X3 cm right kidney  Assessment/Plan:   57 y/o female with hypertension, type 2 DM, FSGS and CKD V, COPD, HFpEF, aortic sclerosis, admitted with chest pain.  Chest pain, troponin elevation: Likely related to hypertensive urgency in the setting of advanced CKD, although obstructive CAD cannot be excluded. Unfortunately, ischemic workup is limited at this stage due to her advanced CKD-not on dialysis. Continue medical management at this time. Continue amlodipine 10 mg, bisoprolol 20 mg, clonidine 0.2 mg daily. Continue hydralazine 50 mg tid. Will change to [facility]  outpatient.   Acute on chronic diastolic heart failure: Continue aggressive blood pressure control. Continue lasix 40 mg PO daily.   Anticipate discharge today or tomorrow. Will arrange outpatient follow up.   Cardiology will sign off. Please call us if any questions.    LOS: 0 days    Druanne Bosques J Daneshia Tavano 12/13/2017, 9:43 AM  [place], MD [facility] Pager: [phone] Office: [phone] If no answer  Cell [phone]

## 2017-12-13 NOTE — Progress Notes (Addendum)
[place] for heparin Indication: r/o PE  Allergies  Allergen Reactions  . Adalimumab Other (See Comments)    Blisters on abdomen =humira  . Ibuprofen Other (See Comments)    Renal Problems  . Leflunomide And Related Other (See Comments)    Severe Headache  . Lexapro [Escitalopram Oxalate] Other (See Comments)    Hand problems - Serotonin Syndrome   . Lisinopril Swelling    Angioedema   . Other     Cosentyx= angioedema   . Pistachio Nut (Diagnostic)     Mouth itches and tongue swelling Hazel nuts and pecans also  . Statins Other (See Comments)    Leg pains and weakness  . Trazodone And Nefazodone Other (See Comments)    Serotonin Syndrome     Patient Measurements: Height: 5\' 4"  (162.6 cm) Weight: 179 lb 0.2 oz (81.2 kg) IBW/kg (Calculated) : 54.7 Heparin Dosing Weight: 75kg  Vital Signs: Temp: 97.9 F (36.6 C) (11/03 0449) Temp Source: Oral (11/03 0449) BP: 150/80 (11/03 0659) Pulse Rate: 75 (11/03 0449)  Labs: Recent Labs    12/12/17 0132 12/12/17 0357 12/12/17 0759 12/12/17 1347 12/12/17 2042 12/13/17 0359 12/13/17 0630  HGB 8.2*  --   --   --   --  7.1*  --   HCT 26.2*  --   --   --   --  22.8*  --   PLT 285  --   --   --   --  270  --   HEPARINUNFRC  --   --   --  0.32 0.26*  --  0.38  CREATININE 5.50*  --   --   --   --   --   --   TROPONINI  --  0.03* 0.06* 0.06*  --   --   --     Estimated Creatinine Clearance: 11.6 mL/min (A) (by C-G formula based on SCr of 5.5 mg/dL (H)).   Medical History: Past Medical History:  Diagnosis Date  . Anemia   . Arthritis    oa and psoriatic ra  . Asthma   . Back pain, chronic    lower back  . Candida infection finished tx 2 days ago  . Chronic insomnia   . Chronic kidney disease    stage 3 ckd last ov nephrology 04-29-17 epic  . Chronic kidney disease (CKD) stage G4/A1, severely decreased glomerular filtration rate (GFR) between 15-29 mL/min/1.73 square meter and  albuminuria creatinine ratio less than 30 mg/g (HCC)   . COPD (chronic obstructive pulmonary disease) ([place])   . Diabetes mellitus    type 2  . Eczema   . Essential hypertension   . GERD (gastroesophageal reflux disease)   . History of hiatal hernia   . Obesity   . Pneumonia few yrs ago x 2    Assessment: 57yo female c/o SOB and left-sided CP and concern for ACS (VQ negative for PE) -heparin level = 0.38 on 1450 units/hr, slight drop in Hgb but no bleeding per nurse.   Goal of Therapy:  Heparin level 0.3-0.7 units/ml Monitor platelets by anticoagulation protocol: Yes   Plan:  -Continue heparin gtt at 1450units/hr  -Heparin daily with CBC daily  Harrietta Guardian, PharmD PGY1 Pharmacy Resident 12/13/2017    8:35 AM  **Pharmacist phone directory can now be found on [email or web address] ([email or web address]).  Listed under [place].

## 2017-12-14 DIAGNOSIS — R079 Chest pain, unspecified: Secondary | ICD-10-CM | POA: Diagnosis not present

## 2017-12-14 LAB — TYPE AND SCREEN
ABO/RH(D): AB POS
Antibody Screen: NEGATIVE
Unit division: 0
Unit division: 0

## 2017-12-14 LAB — CBC WITH DIFFERENTIAL/PLATELET
Abs Immature Granulocytes: 0.04 10*3/uL (ref 0.00–0.07)
Basophils Absolute: 0 10*3/uL (ref 0.0–0.1)
Basophils Relative: 1 %
Eosinophils Absolute: 0.6 10*3/uL — ABNORMAL HIGH (ref 0.0–0.5)
Eosinophils Relative: 8 %
HCT: 31.1 % — ABNORMAL LOW (ref 36.0–46.0)
Hemoglobin: 9.9 g/dL — ABNORMAL LOW (ref 12.0–15.0)
Immature Granulocytes: 1 %
Lymphocytes Relative: 29 %
Lymphs Abs: 2.4 10*3/uL (ref 0.7–4.0)
MCH: 28.9 pg (ref 26.0–34.0)
MCHC: 31.8 g/dL (ref 30.0–36.0)
MCV: 90.9 fL (ref 80.0–100.0)
Monocytes Absolute: 0.4 10*3/uL (ref 0.1–1.0)
Monocytes Relative: 5 %
Neutro Abs: 4.7 10*3/uL (ref 1.7–7.7)
Neutrophils Relative %: 56 %
Platelets: 268 10*3/uL (ref 150–400)
RBC: 3.42 MIL/uL — ABNORMAL LOW (ref 3.87–5.11)
RDW: 17.1 % — ABNORMAL HIGH (ref 11.5–15.5)
WBC: 8.3 10*3/uL (ref 4.0–10.5)
nRBC: 0 % (ref 0.0–0.2)

## 2017-12-14 LAB — BPAM RBC
Blood Product Expiration Date: 201911282359
Blood Product Expiration Date: 201911282359
ISSUE DATE / TIME: 201911031720
ISSUE DATE / TIME: 201911032112
Unit Type and Rh: 6200
Unit Type and Rh: 6200

## 2017-12-14 LAB — RENAL FUNCTION PANEL
Albumin: 2 g/dL — ABNORMAL LOW (ref 3.5–5.0)
Anion gap: 4 — ABNORMAL LOW (ref 5–15)
BUN: 41 mg/dL — ABNORMAL HIGH (ref 6–20)
CO2: 24 mmol/L (ref 22–32)
Calcium: 9.2 mg/dL (ref 8.9–10.3)
Chloride: 108 mmol/L (ref 98–111)
Creatinine, Ser: 5.29 mg/dL — ABNORMAL HIGH (ref 0.44–1.00)
GFR calc Af Amer: 9 mL/min — ABNORMAL LOW (ref >60)
GFR calc non Af Amer: 8 mL/min — ABNORMAL LOW (ref >60)
Glucose, Bld: 113 mg/dL — ABNORMAL HIGH (ref 70–99)
Phosphorus: 3.8 mg/dL (ref 2.5–4.6)
Potassium: 4.5 mmol/L (ref 3.5–5.1)
Sodium: 136 mmol/L (ref 135–145)

## 2017-12-14 LAB — GLUCOSE, CAPILLARY
Glucose-Capillary: 124 mg/dL — ABNORMAL HIGH (ref 70–99)
Glucose-Capillary: 130 mg/dL — ABNORMAL HIGH (ref 70–99)

## 2017-12-14 LAB — MAGNESIUM: Magnesium: 1.6 mg/dL — ABNORMAL LOW (ref 1.7–2.4)

## 2017-12-14 LAB — ABO/RH: ABO/RH(D): AB POS

## 2017-12-14 LAB — HEPARIN LEVEL (UNFRACTIONATED): Heparin Unfractionated: 0.4 IU/mL (ref 0.30–0.70)

## 2017-12-14 MED ORDER — GABAPENTIN 300 MG PO CAPS: 300.0000 mg | ORAL_CAPSULE | Freq: Every evening | ORAL | 2 refills | Status: DC | PRN

## 2017-12-14 MED ORDER — ASPIRIN 325 MG PO TABS: 325.0000 mg | ORAL_TABLET | Freq: Every day | ORAL | 1 refills | Status: DC

## 2017-12-14 MED ORDER — BISOPROLOL FUMARATE 10 MG PO TABS: 20.0000 mg | ORAL_TABLET | Freq: Every day | ORAL | 1 refills | Status: DC

## 2017-12-14 MED ORDER — HYDRALAZINE HCL 50 MG PO TABS: 50.0000 mg | ORAL_TABLET | Freq: Three times a day (TID) | ORAL | 1 refills | Status: DC

## 2017-12-14 MED ORDER — ASPIRIN 325 MG PO TABS: 325.0000 mg | ORAL_TABLET | Freq: Every day | ORAL | Status: DC

## 2017-12-14 NOTE — Progress Notes (Signed)
[place] for heparin Indication: r/o PE  Allergies  Allergen Reactions  . Adalimumab Other (See Comments)    Blisters on abdomen =humira  . Ibuprofen Other (See Comments)    Renal Problems  . Leflunomide And Related Other (See Comments)    Severe Headache  . Lexapro [Escitalopram Oxalate] Other (See Comments)    Hand problems - Serotonin Syndrome   . Lisinopril Swelling    Angioedema   . Other     Cosentyx= angioedema   . Pistachio Nut (Diagnostic)     Mouth itches and tongue swelling Hazel nuts and pecans also  . Statins Other (See Comments)    Leg pains and weakness  . Trazodone And Nefazodone Other (See Comments)    Serotonin Syndrome     Patient Measurements: Height: 5\' 4"  (162.6 cm) Weight: 173 lb (78.5 kg) IBW/kg (Calculated) : 54.7 Heparin Dosing Weight: 75kg  Vital Signs: Temp: 98.5 F (36.9 C) (11/04 0734) Temp Source: Oral (11/04 0734) BP: 171/92 (11/04 0734) Pulse Rate: 75 (11/04 0734)  Labs: Recent Labs    12/12/17 0132  12/12/17 0759  12/12/17 1347 12/12/17 2042 12/13/17 0359 12/13/17 0630 12/13/17 1316 12/14/17 0524  HGB 8.2*  --   --   --   --   --  7.1*  --  7.7* 9.9*  HCT 26.2*  --   --   --   --   --  22.8*  --  25.6* 31.1*  PLT 285  --   --   --   --   --  270  --   --  268  HEPARINUNFRC  --   --   --    < > 0.32 0.26*  --  0.38  --  0.40  CREATININE 5.50*  --   --   --   --   --   --   --   --  5.29*  TROPONINI  --    < > 0.06*  --  0.06*  --  0.04*  --   --   --    < > = values in this interval not displayed.    Estimated Creatinine Clearance: 11.9 mL/min (A) (by C-G formula based on SCr of 5.29 mg/dL (H)).   Medical History: Past Medical History:  Diagnosis Date  . Anemia   . Arthritis    oa and psoriatic ra  . Asthma   . Back pain, chronic    lower back  . Candida infection finished tx 2 days ago  . Chronic insomnia   . Chronic kidney disease    stage 3 ckd last ov nephrology  04-29-17 epic  . Chronic kidney disease (CKD) stage G4/A1, severely decreased glomerular filtration rate (GFR) between 15-29 mL/min/1.73 square meter and albuminuria creatinine ratio less than 30 mg/g (HCC)   . COPD (chronic obstructive pulmonary disease) ([place])   . Diabetes mellitus    type 2  . Eczema   . Essential hypertension   . GERD (gastroesophageal reflux disease)   . History of hiatal hernia   . Obesity   . Pneumonia few yrs ago x 2    Assessment: 57yo female c/o SOB and left-sided CP and concern for ACS (VQ negative for PE). -heparin level therapeutic. Hg up to 9.9 s/p transfusion 11/3, plt wnl. No bleed issues documented. Cardiology planning discharge, likely today per notes.  Goal of Therapy:  Heparin level 0.3-0.7 units/ml Monitor platelets by anticoagulation protocol: Yes  Plan:  -Continue heparin gtt at 1450units/hr  -Monitor daily heparin level and CBC, s/sx bleeding -F/u Cardiology plans  Elicia Lamp, PharmD, BCPS Clinical Pharmacist Clinical phone [phone] Please check AMION for all [place] contact numbers 12/14/2017 8:26 AM

## 2017-12-14 NOTE — Discharge Summary (Signed)
Physician Discharge Summary  Patient ID: Jodi Bell MRN: [id number] DOB/AGE: [birth year] 57 y.o.  Admit date: 12/12/2017 Discharge date: 12/14/2017  Admission Diagnoses:  Discharge Diagnoses:  NSTEMI Anemia, likely multifactorial, including anemia of CKD Type 2 diabetes mellitus ([place]) Essential hypertension, uncontrolled 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 2 diabetes mellitus [facility])  Discharged Condition: stable  Hospital Course:  Jodi Bell a 57 year old female, with past medical history significant for CKD stage 5 not on dialysis, hypertension, diabetes mellitus type 2 and asthma.  Patient presented with chest pain and significant anemia. Troponin on presentation was 0.06. Patient was admitted for further assessment and management.  Cardiac enzymes were cycled.  Cardiology team was consulted to assist with management of patient's chest pain and minimally elevated troponin.  Due to advanced kidney impairment, cardiac work-up was very limited.  Patient was started on heparin drip.  VQ scan was negative for pulmonary embolism.  Hemoglobin was initially 8.2 g/dL, but dropped to 7.7 g/dL.  Patient was transfused with 2 units of packed red blood cells.  Hemoglobin prior to discharge was 9.9 g/dL.  Iron level was within normal range.  With above management, the chest pain resolved.  Patient will be discharged to the care of the primary care provider, cardiology team and nephrology team.  Patient will follow up with Nephrology on discharge for likely EPO treatment.  BP was noted to be uncontrolled on presentation, but optimized during the hospital stay.  Chest pain (NSTEMI): Troponin trended.   Troponin has remained stable.   Chest pain has resolved. Heparin drip will be discontinued prior to discharge. Cardiology team directed patient's care. VQ scan was not suggestive of pulmonary embolism.  Significant anemia noted. Patient was transfused with 2 units of packed red blood cells during the  hospital stay.  Anemia: Likely multifactorial. Patient has advanced CKD. Iron was within normal range.   Patient was on iron prior to admission Patient was transfused with 2 units of packed red blood cells. Follow with nephrology on discharge for possible EPO  Uncontrolled hypertension:  Continue to optimize.   Blood pressure control has improved significantly  DM2: Sensitive SSI   CKD stage 5: Chronic and stable Follow up with Nephrology on discharge for management of advanced CKD. Likely EPO therapy on discharge.  Consults: cardiology  Significant Diagnostic Studies: labs: Troponin peaked at 0.06.   Discharge Exam: Blood pressure (!) 158/89, pulse 72, temperature 98.6 F (37 C), temperature source Oral, resp. rate 18, height 5\' 4"  (1.626 m), weight 78.5 kg, SpO2 97 %.   Disposition: Discharge disposition: 01-Home or Self Care  Discharge Instructions    Diet - low sodium heart healthy   Complete by:  As directed    Increase activity slowly   Complete by:  As directed      Allergies as of 12/14/2017      Reactions   Adalimumab Other (See Comments)   Blisters on abdomen =humira   Ibuprofen Other (See Comments)   Renal Problems   Leflunomide And Related Other (See Comments)   Severe Headache   Lexapro [escitalopram Oxalate] Other (See Comments)   Hand problems - Serotonin Syndrome    Lisinopril Swelling   Angioedema    Other    Cosentyx= angioedema   Pistachio Nut (diagnostic)    Mouth itches and tongue swelling Hazel nuts and pecans also   Statins Other (See Comments)   Leg pains and weakness   Trazodone And Nefazodone Other (See Comments)   Serotonin  Syndrome       Medication List    STOP taking these medications   diclofenac sodium 1 % Gel Commonly known as:  VOLTAREN   PROBIOTIC DAILY PO   promethazine 12.5 MG tablet Commonly known as:  PHENERGAN   ranitidine 300 MG tablet Commonly known as:  ZANTAC     TAKE these medications    allopurinol 300 MG tablet Commonly known as:  ZYLOPRIM Take 150 mg by mouth daily.   amLODipine 10 MG tablet Commonly known as:  NORVASC Take 10 mg by mouth daily.   aspirin 325 MG tablet Take 1 tablet (325 mg total) by mouth daily.   bisoprolol 10 MG tablet Commonly known as:  ZEBETA Take 2 tablets (20 mg total) by mouth daily.   cetirizine 10 MG tablet Commonly known as:  ZYRTEC Take 20 mg by mouth 2 (two) times daily.   cloNIDine 0.2 MG tablet Commonly known as:  CATAPRES Take 0.2 mg by mouth 2 (two) times daily.   EPINEPHrine 0.3 mg/0.3 mL Soaj injection Commonly known as:  EPI-PEN Inject 0.3 mLs (0.3 mg total) into the muscle once. What changed:    when to take this  reasons to take this   esomeprazole 20 MG capsule Commonly known as:  NEXIUM Take 20 mg by mouth every evening.   ferrous sulfate 325 (65 FE) MG tablet Take 325 mg by mouth daily.   fluticasone 50 MCG/ACT nasal spray Commonly known as:  FLONASE Place 1 spray into both nostrils daily.   folic acid 154 MCG tablet Commonly known as:  FOLVITE Take 400 mcg by mouth daily.   furosemide 40 MG tablet Commonly known as:  LASIX Take 40 mg by mouth daily.   gabapentin 300 MG capsule Commonly known as:  NEURONTIN Take 1 capsule (300 mg total) by mouth at bedtime as needed (Neuropathic pain). What changed:    how much to take  reasons to take this   hydrALAZINE 50 MG tablet Commonly known as:  APRESOLINE Take 1 tablet (50 mg total) by mouth every 8 (eight) hours. What changed:    medication strength  how much to take  when to take this   JANUVIA 100 MG tablet Generic drug:  sitaGLIPtin Take 100 mg by mouth daily.   methocarbamol 500 MG tablet Commonly known as:  ROBAXIN Take 1 tablet (500 mg total) by mouth every 6 (six) hours as needed for muscle spasms.   montelukast 10 MG tablet Commonly known as:  SINGULAIR Take 10 mg by mouth at bedtime.   multivitamin tablet Take 1  tablet by mouth daily.   Omega 3 1000 MG Caps Take 1,000 mg by mouth daily.   Oxycodone HCl 10 MG Tabs Take 1-2 tablets (10-20 mg total) by mouth every 6 (six) hours as needed for severe pain (pain score 7-10).   PERI-COLACE 8.6-50 MG tablet Generic drug:  senna-docusate Take 2 tablets by mouth at bedtime.   predniSONE 2.5 MG tablet Commonly known as:  DELTASONE Take 2.5 mg by mouth daily with breakfast.   TRESIBA FLEXTOUCH 200 UNIT/ML Sopn Generic drug:  Insulin Degludec Inject 14 Units into the skin every evening.   VENTOLIN HFA 108 (90 Base) MCG/ACT inhaler Generic drug:  albuterol Inhale 1-2 puffs into the lungs every 6 (six) hours as needed for shortness of breath. wheezing   Vitamin D (Ergocalciferol) 50000 units Caps capsule Commonly known as:  DRISDOL Take 50,000 Units by mouth every Tuesday.   zolpidem 10 MG  tablet Commonly known as:  AMBIEN Take 10 mg by mouth at bedtime.        SignedBonnell Public 12/14/2017, 12:20 PM

## 2018-02-27 ENCOUNTER — Other Ambulatory Visit: Payer: Self-pay

## 2018-02-27 ENCOUNTER — Encounter (HOSPITAL_COMMUNITY): Payer: Self-pay | Admitting: Emergency Medicine

## 2018-02-27 ENCOUNTER — Emergency Department (HOSPITAL_COMMUNITY): Payer: Medicare PPO

## 2018-02-27 ENCOUNTER — Inpatient Hospital Stay (HOSPITAL_COMMUNITY)
Admission: EM | Admit: 2018-02-27 | Discharge: 2018-03-01 | DRG: 291 | Disposition: A | Discharge status: Home / Self Care | Payer: Medicare PPO | Attending: Internal Medicine | Admitting: Internal Medicine

## 2018-02-27 DIAGNOSIS — E1122 Type 2 diabetes mellitus with diabetic chronic kidney disease: Secondary | ICD-10-CM | POA: Diagnosis present

## 2018-02-27 DIAGNOSIS — Z794 Long term (current) use of insulin: Secondary | ICD-10-CM

## 2018-02-27 DIAGNOSIS — N186 End stage renal disease: Secondary | ICD-10-CM | POA: Diagnosis present

## 2018-02-27 DIAGNOSIS — E8770 Fluid overload, unspecified: Secondary | ICD-10-CM | POA: Diagnosis present

## 2018-02-27 DIAGNOSIS — Z6835 Body mass index (BMI) 35.0-35.9, adult: Secondary | ICD-10-CM

## 2018-02-27 DIAGNOSIS — Z833 Family history of diabetes mellitus: Secondary | ICD-10-CM

## 2018-02-27 DIAGNOSIS — I132 Hypertensive heart and chronic kidney disease with heart failure and with stage 5 chronic kidney disease, or end stage renal disease: Principal | ICD-10-CM | POA: Diagnosis present

## 2018-02-27 DIAGNOSIS — F17210 Nicotine dependence, cigarettes, uncomplicated: Secondary | ICD-10-CM | POA: Diagnosis present

## 2018-02-27 DIAGNOSIS — J42 Unspecified chronic bronchitis: Secondary | ICD-10-CM | POA: Diagnosis not present

## 2018-02-27 DIAGNOSIS — N185 Chronic kidney disease, stage 5: Secondary | ICD-10-CM

## 2018-02-27 DIAGNOSIS — Z7952 Long term (current) use of systemic steroids: Secondary | ICD-10-CM

## 2018-02-27 DIAGNOSIS — I10 Essential (primary) hypertension: Secondary | ICD-10-CM | POA: Diagnosis not present

## 2018-02-27 DIAGNOSIS — J449 Chronic obstructive pulmonary disease, unspecified: Secondary | ICD-10-CM | POA: Diagnosis present

## 2018-02-27 DIAGNOSIS — Z7982 Long term (current) use of aspirin: Secondary | ICD-10-CM | POA: Diagnosis not present

## 2018-02-27 DIAGNOSIS — Z888 Allergy status to other drugs, medicaments and biological substances status: Secondary | ICD-10-CM | POA: Diagnosis not present

## 2018-02-27 DIAGNOSIS — M109 Gout, unspecified: Secondary | ICD-10-CM | POA: Diagnosis present

## 2018-02-27 DIAGNOSIS — F5104 Psychophysiologic insomnia: Secondary | ICD-10-CM | POA: Diagnosis present

## 2018-02-27 DIAGNOSIS — Z8249 Family history of ischemic heart disease and other diseases of the circulatory system: Secondary | ICD-10-CM | POA: Diagnosis not present

## 2018-02-27 DIAGNOSIS — K219 Gastro-esophageal reflux disease without esophagitis: Secondary | ICD-10-CM | POA: Diagnosis present

## 2018-02-27 DIAGNOSIS — E669 Obesity, unspecified: Secondary | ICD-10-CM | POA: Diagnosis present

## 2018-02-27 DIAGNOSIS — I5032 Chronic diastolic (congestive) heart failure: Secondary | ICD-10-CM | POA: Diagnosis present

## 2018-02-27 DIAGNOSIS — N184 Chronic kidney disease, stage 4 (severe): Secondary | ICD-10-CM | POA: Diagnosis present

## 2018-02-27 DIAGNOSIS — Z79899 Other long term (current) drug therapy: Secondary | ICD-10-CM | POA: Diagnosis not present

## 2018-02-27 DIAGNOSIS — D631 Anemia in chronic kidney disease: Secondary | ICD-10-CM | POA: Diagnosis present

## 2018-02-27 DIAGNOSIS — E119 Type 2 diabetes mellitus without complications: Secondary | ICD-10-CM

## 2018-02-27 DIAGNOSIS — D649 Anemia, unspecified: Secondary | ICD-10-CM | POA: Diagnosis present

## 2018-02-27 DIAGNOSIS — J441 Chronic obstructive pulmonary disease with (acute) exacerbation: Secondary | ICD-10-CM | POA: Diagnosis present

## 2018-02-27 DIAGNOSIS — Z91018 Allergy to other foods: Secondary | ICD-10-CM

## 2018-02-27 LAB — BASIC METABOLIC PANEL
Anion gap: 12 (ref 5–15)
BUN: 63 mg/dL — ABNORMAL HIGH (ref 6–20)
CO2: 20 mmol/L — ABNORMAL LOW (ref 22–32)
Calcium: 8.8 mg/dL — ABNORMAL LOW (ref 8.9–10.3)
Chloride: 103 mmol/L (ref 98–111)
Creatinine, Ser: 8.23 mg/dL — ABNORMAL HIGH (ref 0.44–1.00)
GFR calc Af Amer: 6 mL/min — ABNORMAL LOW (ref >60)
GFR calc non Af Amer: 5 mL/min — ABNORMAL LOW (ref >60)
Glucose, Bld: 67 mg/dL — ABNORMAL LOW (ref 70–99)
Potassium: 4.1 mmol/L (ref 3.5–5.1)
Sodium: 135 mmol/L (ref 135–145)

## 2018-02-27 LAB — CBC WITH DIFFERENTIAL/PLATELET
Abs Immature Granulocytes: 0.04 10*3/uL (ref 0.00–0.07)
Basophils Absolute: 0 10*3/uL (ref 0.0–0.1)
Basophils Relative: 0 %
Eosinophils Absolute: 0.4 10*3/uL (ref 0.0–0.5)
Eosinophils Relative: 5 %
HCT: 27.9 % — ABNORMAL LOW (ref 36.0–46.0)
Hemoglobin: 8.4 g/dL — ABNORMAL LOW (ref 12.0–15.0)
Immature Granulocytes: 0 %
Lymphocytes Relative: 23 %
Lymphs Abs: 2.1 10*3/uL (ref 0.7–4.0)
MCH: 28.2 pg (ref 26.0–34.0)
MCHC: 30.1 g/dL (ref 30.0–36.0)
MCV: 93.6 fL (ref 80.0–100.0)
Monocytes Absolute: 0.6 10*3/uL (ref 0.1–1.0)
Monocytes Relative: 6 %
Neutro Abs: 5.9 10*3/uL (ref 1.7–7.7)
Neutrophils Relative %: 66 %
Platelets: 307 10*3/uL (ref 150–400)
RBC: 2.98 MIL/uL — ABNORMAL LOW (ref 3.87–5.11)
RDW: 15.2 % (ref 11.5–15.5)
WBC: 9 10*3/uL (ref 4.0–10.5)
nRBC: 0 % (ref 0.0–0.2)

## 2018-02-27 LAB — INFLUENZA PANEL BY PCR (TYPE A & B)
Influenza A By PCR: NEGATIVE
Influenza B By PCR: NEGATIVE

## 2018-02-27 LAB — BRAIN NATRIURETIC PEPTIDE: B Natriuretic Peptide: 889.6 pg/mL — ABNORMAL HIGH (ref 0.0–100.0)

## 2018-02-27 LAB — I-STAT TROPONIN, ED: Troponin i, poc: 0.01 ng/mL (ref 0.00–0.08)

## 2018-02-27 LAB — GLUCOSE, CAPILLARY: Glucose-Capillary: 66 mg/dL — ABNORMAL LOW (ref 70–99)

## 2018-02-27 MED ORDER — HEPARIN SODIUM (PORCINE) 5000 UNIT/ML IJ SOLN
5000.0000 [IU] | Freq: Three times a day (TID) | INTRAMUSCULAR | Status: DC
  Administered 2018-02-27 – 2018-03-01 (×5): 5000 [IU] via SUBCUTANEOUS
  Filled 2018-02-27 (×5): qty 1

## 2018-02-27 MED ORDER — FERROUS SULFATE 325 (65 FE) MG PO TABS
325.0000 mg | ORAL_TABLET | Freq: Every day | ORAL | Status: DC
  Administered 2018-02-28 – 2018-03-01 (×2): 325 mg via ORAL
  Filled 2018-02-27 (×2): qty 1

## 2018-02-27 MED ORDER — CLONIDINE HCL 0.2 MG PO TABS
0.2000 mg | ORAL_TABLET | Freq: Two times a day (BID) | ORAL | Status: DC
  Administered 2018-02-27 – 2018-03-01 (×4): 0.2 mg via ORAL
  Filled 2018-02-27 (×4): qty 1

## 2018-02-27 MED ORDER — ALLOPURINOL 300 MG PO TABS
150.0000 mg | ORAL_TABLET | Freq: Every day | ORAL | Status: DC
  Administered 2018-02-28 – 2018-03-01 (×2): 150 mg via ORAL
  Filled 2018-02-27 (×2): qty 1

## 2018-02-27 MED ORDER — MONTELUKAST SODIUM 10 MG PO TABS
10.0000 mg | ORAL_TABLET | Freq: Every day | ORAL | Status: DC
  Administered 2018-02-27 – 2018-02-28 (×2): 10 mg via ORAL
  Filled 2018-02-27 (×2): qty 1

## 2018-02-27 MED ORDER — PANTOPRAZOLE SODIUM 40 MG PO TBEC
40.0000 mg | DELAYED_RELEASE_TABLET | Freq: Every day | ORAL | Status: DC
  Administered 2018-02-28 – 2018-03-01 (×2): 40 mg via ORAL
  Filled 2018-02-27 (×2): qty 1

## 2018-02-27 MED ORDER — FLUTICASONE PROPIONATE 50 MCG/ACT NA SUSP
1.0000 | Freq: Every day | NASAL | Status: DC
  Administered 2018-03-01: 1 via NASAL
  Filled 2018-02-27 (×2): qty 16

## 2018-02-27 MED ORDER — FUROSEMIDE 10 MG/ML IJ SOLN
40.0000 mg | Freq: Once | INTRAMUSCULAR | Status: AC
  Administered 2018-02-27: 40 mg via INTRAVENOUS
  Filled 2018-02-27: qty 4

## 2018-02-27 MED ORDER — INSULIN GLARGINE 100 UNIT/ML ~~LOC~~ SOLN
14.0000 [IU] | Freq: Every day | SUBCUTANEOUS | Status: DC
  Administered 2018-02-28: 14 [IU] via SUBCUTANEOUS
  Filled 2018-02-27: qty 0.14

## 2018-02-27 MED ORDER — OXYCODONE HCL 5 MG PO TABS
10.0000 mg | ORAL_TABLET | Freq: Four times a day (QID) | ORAL | Status: DC | PRN
  Administered 2018-02-27: 10 mg via ORAL
  Filled 2018-02-27: qty 2

## 2018-02-27 MED ORDER — METHOCARBAMOL 500 MG PO TABS: 500.0000 mg | ORAL_TABLET | Freq: Four times a day (QID) | ORAL | Status: DC | PRN

## 2018-02-27 MED ORDER — LORATADINE 10 MG PO TABS
10.0000 mg | ORAL_TABLET | Freq: Every day | ORAL | Status: DC
  Administered 2018-02-28 – 2018-03-01 (×2): 10 mg via ORAL
  Filled 2018-02-27 (×2): qty 1

## 2018-02-27 MED ORDER — BISOPROLOL FUMARATE 10 MG PO TABS
20.0000 mg | ORAL_TABLET | Freq: Every day | ORAL | Status: DC
  Administered 2018-02-28 – 2018-03-01 (×2): 20 mg via ORAL
  Filled 2018-02-27 (×2): qty 2

## 2018-02-27 MED ORDER — ACETAMINOPHEN 325 MG PO TABS: 650.0000 mg | ORAL_TABLET | Freq: Four times a day (QID) | ORAL | Status: DC | PRN

## 2018-02-27 MED ORDER — INSULIN DEGLUDEC 200 UNIT/ML ~~LOC~~ SOPN: 14.0000 [IU] | PEN_INJECTOR | Freq: Every evening | SUBCUTANEOUS | Status: DC

## 2018-02-27 MED ORDER — IPRATROPIUM-ALBUTEROL 0.5-2.5 (3) MG/3ML IN SOLN
3.0000 mL | Freq: Once | RESPIRATORY_TRACT | Status: AC
  Administered 2018-02-27: 3 mL via RESPIRATORY_TRACT
  Filled 2018-02-27: qty 3

## 2018-02-27 MED ORDER — ASPIRIN 325 MG PO TABS: 325.0000 mg | ORAL_TABLET | Freq: Every day | ORAL | Status: DC

## 2018-02-27 MED ORDER — HYDRALAZINE HCL 50 MG PO TABS
50.0000 mg | ORAL_TABLET | Freq: Three times a day (TID) | ORAL | Status: DC
  Administered 2018-02-27 – 2018-02-28 (×2): 50 mg via ORAL
  Filled 2018-02-27 (×2): qty 1

## 2018-02-27 MED ORDER — ADULT MULTIVITAMIN W/MINERALS CH
1.0000 | ORAL_TABLET | Freq: Every day | ORAL | Status: DC
  Administered 2018-02-28 – 2018-03-01 (×2): 1 via ORAL
  Filled 2018-02-27 (×2): qty 1

## 2018-02-27 MED ORDER — VITAMIN D (ERGOCALCIFEROL) 1.25 MG (50000 UNIT) PO CAPS: 50000.0000 [IU] | ORAL_CAPSULE | ORAL | Status: DC

## 2018-02-27 MED ORDER — ALBUTEROL SULFATE (2.5 MG/3ML) 0.083% IN NEBU
2.5000 mg | INHALATION_SOLUTION | Freq: Four times a day (QID) | RESPIRATORY_TRACT | Status: DC | PRN
  Administered 2018-02-28: 2.5 mg via RESPIRATORY_TRACT
  Filled 2018-02-27: qty 3

## 2018-02-27 MED ORDER — FUROSEMIDE 40 MG PO TABS
40.0000 mg | ORAL_TABLET | Freq: Every day | ORAL | Status: DC
  Administered 2018-02-28: 40 mg via ORAL
  Filled 2018-02-27: qty 1

## 2018-02-27 MED ORDER — LINAGLIPTIN 5 MG PO TABS
5.0000 mg | ORAL_TABLET | Freq: Every day | ORAL | Status: DC
  Administered 2018-02-28 – 2018-03-01 (×2): 5 mg via ORAL
  Filled 2018-02-27 (×2): qty 1

## 2018-02-27 MED ORDER — ZOLPIDEM TARTRATE 5 MG PO TABS
5.0000 mg | ORAL_TABLET | Freq: Every evening | ORAL | Status: DC | PRN
  Administered 2018-02-28: 5 mg via ORAL
  Filled 2018-02-27: qty 1

## 2018-02-27 MED ORDER — FOLIC ACID 800 MCG PO TABS: 400.0000 ug | ORAL_TABLET | Freq: Every day | ORAL | Status: DC

## 2018-02-27 MED ORDER — SENNOSIDES-DOCUSATE SODIUM 8.6-50 MG PO TABS
2.0000 | ORAL_TABLET | Freq: Every day | ORAL | Status: DC
  Administered 2018-02-27 – 2018-02-28 (×2): 2 via ORAL
  Filled 2018-02-27 (×2): qty 2

## 2018-02-27 MED ORDER — BENZOCAINE 10 % MT GEL
Freq: Three times a day (TID) | OROMUCOSAL | Status: DC | PRN
  Filled 2018-02-27: qty 9

## 2018-02-27 MED ORDER — PREDNISONE 5 MG PO TABS
2.5000 mg | ORAL_TABLET | Freq: Every day | ORAL | Status: DC
  Administered 2018-02-28 – 2018-03-01 (×2): 2.5 mg via ORAL
  Filled 2018-02-27 (×3): qty 1

## 2018-02-27 MED ORDER — GABAPENTIN 300 MG PO CAPS: 300.0000 mg | ORAL_CAPSULE | Freq: Every evening | ORAL | Status: DC | PRN

## 2018-02-27 MED ORDER — OMEGA 3 1000 MG PO CAPS: 1000.0000 mg | ORAL_CAPSULE | Freq: Every day | ORAL | Status: DC

## 2018-02-27 MED ORDER — AMLODIPINE BESYLATE 10 MG PO TABS
10.0000 mg | ORAL_TABLET | Freq: Every day | ORAL | Status: DC
  Administered 2018-02-28 – 2018-03-01 (×2): 10 mg via ORAL
  Filled 2018-02-27 (×2): qty 1

## 2018-02-27 NOTE — ED Triage Notes (Signed)
Patient c/o feeling lightheaded and bilateral leg edema. Patient states she has had a cough x 3 weeks. Said she has had multiple xrays and has not had any infection but was given a z-pack and has two pills left.

## 2018-02-27 NOTE — H&P (Signed)
History and Physical  Jodi Bell JSH:[id number] DOB: 05-01-1960 DOA: 02/27/2018  Referring physician:   Gari Crown      PCP: [facility]  Outpatient Specialists: Dr. Neta Ehlers Patient coming from: Home & is able to ambulate yes  Chief Complaint: Shortness of breath cough generalized weakness  HPI: Jodi Bell is a 58 y.o. female with medical history significant for end-stage renal disease stage V, hypertension, anemia of chronic disease, mild chronic low back pain, COPD, hiatal hernia, recent left knee surgery in November who presented to the emergency room with complaints of cough and flulike symptoms for 3 weeks the cough is productive of tan sputum, and in the past 3 days has experienced increased shortness of breath with dyspnea on exertion as well as swelling of her legs and hands.  She denies abdominal bloating chest pain or fever.  She had been using  oral Lasix with no improvement in her urine output, she contacted her primary care provider who advised her to come to the emergency room so that she could have IV Lasix.  She had also been treated with a Zithromax by her primary care doctor for her cough in the emergency room she was given IV Lasix with little response in urine output.  ED Course: IV Lasix given with no improvement in urine output.  Influenza virus test was negative for flu.  She is saturating well at 98% on room air pulse is in the 70s the provider consulted with nephrologist on-call Dr. Jimmy Footman who advised to give IV Lasix this but that did not produce any urine  Review of Systems: Patient seen  . Pt complains of lightheadedness and lower extremity edema cough some shortness of breath  Pt denies any chest pain abdominal bloating nausea vomiting or diarrhea.  Review of systems are otherwise negative   Past Medical History:  Diagnosis Date  . Anemia   . Arthritis    oa and psoriatic ra  . Asthma   . Back pain, chronic    lower  back  . Candida infection finished tx 2 days ago  . Chronic insomnia   . Chronic kidney disease    stage 3 ckd last ov nephrology 04-29-17 epic  . Chronic kidney disease (CKD) stage G4/A1, severely decreased glomerular filtration rate (GFR) between 15-29 mL/min/1.73 square meter and albuminuria creatinine ratio less than 30 mg/g (HCC)   . COPD (chronic obstructive pulmonary disease) ([place])   . Diabetes mellitus    type 2  . Eczema   . Essential hypertension   . GERD (gastroesophageal reflux disease)   . History of hiatal hernia   . Obesity   . Pneumonia few yrs ago x 2   Past Surgical History:  Procedure Laterality Date  . BACK SURGERY  2016   lower back fusion with cage  . BIOPSY  09/23/2017   Procedure: BIOPSY;  Surgeon: Wonda Horner, MD;  Location: [facility];  Service: Endoscopy;;  . CESAREAN SECTION  1990   x 1   . COLONOSCOPY WITH PROPOFOL N/A 09/23/2017   Procedure: COLONOSCOPY WITH PROPOFOL Hemostatic clips placed;  Surgeon: Wonda Horner, MD;  Location: [facility];  Service: Endoscopy;  Laterality: N/A;  . DILATION AND CURETTAGE OF UTERUS  1988  . ESOPHAGOGASTRODUODENOSCOPY (EGD) WITH PROPOFOL N/A 09/23/2017   Procedure: ESOPHAGOGASTRODUODENOSCOPY (EGD) WITH PROPOFOL;  Surgeon: Wonda Horner, MD;  [facility];  Service: Endoscopy;  Laterality: N/A;  . PARTIAL KNEE ARTHROPLASTY Left 11/12/2017  Procedure: left unicompartmental arthroplasty-medial;  Surgeon: Paralee Cancel, MD;  Location: [facility];  Service: Orthopedics;  Laterality: Left;  20min  . SUBMUCOSAL INJECTION  09/23/2017   Procedure: SUBMUCOSAL INJECTION;  Surgeon: Wonda Horner, MD;  [facility]: [facility];  Service: Endoscopy;;  in colon  . TUBAL LIGATION  1990    Social History:  reports that she has been smoking cigarettes. She has a 20.00 pack-year smoking history. She has never used smokeless tobacco. She reports current alcohol use. She reports that she does not use drugs.   Allergies    Allergen Reactions  . Adalimumab Other (See Comments)    Blisters on abdomen =humira  . Ibuprofen Other (See Comments)    Renal Problems  . Leflunomide And Related Other (See Comments)    Severe Headache  . Lexapro [Escitalopram Oxalate] Other (See Comments)    Hand problems - Serotonin Syndrome   . Lisinopril Swelling    Angioedema   . Other     Cosentyx= angioedema   . Pistachio Nut (Diagnostic)     Mouth itches and tongue swelling Hazel nuts and pecans also  . Statins Other (See Comments)    Leg pains and weakness  . Trazodone And Nefazodone Other (See Comments)    Serotonin Syndrome     Family History  Problem Relation Age of Onset  . Lung cancer Mother   . Diabetes Sister   . Heart disease Sister   . Thyroid cancer Other        1/2 sister  . Heart disease Other        1/2 sister      Prior to Admission medications   Medication Sig Start Date End Date Taking? Authorizing Provider  allopurinol (ZYLOPRIM) 300 MG tablet Take 150 mg by mouth daily.     [provider]  amLODipine (NORVASC) 10 MG tablet Take 10 mg by mouth daily.    [provider]  aspirin 325 MG tablet Take 1 tablet (325 mg total) by mouth daily. 12/14/17   Bonnell Public, MD  bisoprolol (ZEBETA) 10 MG tablet Take 2 tablets (20 mg total) by mouth daily. 12/14/17   Bonnell Public, MD  cetirizine (ZYRTEC) 10 MG tablet Take 20 mg by mouth 2 (two) times daily.  06/13/17   Shelda Pal, DO  cloNIDine (CATAPRES) 0.2 MG tablet Take 0.2 mg by mouth 2 (two) times daily.  07/15/15   [provider]  EPINEPHrine (EPIPEN) 0.3 mg/0.3 mL SOAJ injection Inject 0.3 mLs (0.3 mg total) into the muscle once. Patient taking differently: Inject 0.3 mg into the muscle as needed (for allergic reaction).  09/30/12   Le, Thao P, DO  esomeprazole (NEXIUM) 20 MG capsule Take 20 mg by mouth every evening.     [provider]  ferrous sulfate 325 (65 FE) MG tablet Take 325 mg by  mouth daily.     [provider]  fluticasone (FLONASE) 50 MCG/ACT nasal spray Place 1 spray into both nostrils daily.     [provider]  folic acid (FOLVITE) 263 MCG tablet Take 400 mcg by mouth daily.     [provider]  furosemide (LASIX) 40 MG tablet Take 40 mg by mouth daily.    [provider]  gabapentin (NEURONTIN) 300 MG capsule Take 1 capsule (300 mg total) by mouth at bedtime as needed (Neuropathic pain). 12/14/17 12/14/18  Dana Allan I, MD  hydrALAZINE (APRESOLINE) 50 MG tablet Take 1 tablet (50 mg  total) by mouth every 8 (eight) hours. 12/14/17   Bonnell Public, MD  Insulin Degludec (TRESIBA FLEXTOUCH) 200 UNIT/ML SOPN Inject 14 Units into the skin every evening.     [provider]  JANUVIA 100 MG tablet Take 100 mg by mouth daily.  08/09/15   [provider]  methocarbamol (ROBAXIN) 500 MG tablet Take 1 tablet (500 mg total) by mouth every 6 (six) hours as needed for muscle spasms. 11/13/17   Constable, Amber, PA-C  montelukast (SINGULAIR) 10 MG tablet Take 10 mg by mouth at bedtime.    [provider]  Multiple Vitamin (MULTIVITAMIN) tablet Take 1 tablet by mouth daily.    [provider]  Omega 3 1000 MG CAPS Take 1,000 mg by mouth daily.    [provider]  oxyCODONE 10 MG TABS Take 1-2 tablets (10-20 mg total) by mouth every 6 (six) hours as needed for severe pain (pain score 7-10). 11/13/17   Constable, Amber, PA-C  predniSONE (DELTASONE) 2.5 MG tablet Take 2.5 mg by mouth daily with breakfast.    [provider]  senna-docusate (PERI-COLACE) 8.6-50 MG tablet Take 2 tablets by mouth at bedtime.     [provider]  VENTOLIN HFA 108 (90 Base) MCG/ACT inhaler Inhale 1-2 puffs into the lungs every 6 (six) hours as needed for shortness of breath. wheezing 06/13/15   [provider]  Vitamin D, Ergocalciferol, (DRISDOL) 50000 units CAPS capsule Take 50,000 Units by mouth  every Tuesday.  09/15/17   [provider]  zolpidem (AMBIEN) 10 MG tablet Take 10 mg by mouth at bedtime.     [provider]    Physical Exam: BP (!) 167/84   Pulse 77   Temp 98.1 F (36.7 C) (Oral)   Resp 15   Ht 5\' 2"  (1.575 m)   Wt 84.4 kg   SpO2 100%   BMI 34.02 kg/m   Exam:  . General: 58 y.o. year-old female well developed well nourished in no acute distress.  Alert and oriented x3. . Cardiovascular: Regular rate and rhythm with no rubs or gallops.  No thyromegaly or JVD noted.   Marland Kitchen Respiratory: Frequent coughing slightly productive positive wheezing wheezes audible rales. Good inspiratory effort. . Abdomen: Soft nontender nondistended with normal bowel sounds x4 quadrants. . Musculoskeletal: 2+ pitting lower extremity edema. 2/4 pulses in all 4 extremities.  There is also edema of both hands . Skin: No ulcerative lesions noted or rashes, . Psychiatry: Mood is appropriate for condition and setting           Labs on Admission:  Basic Metabolic Panel: Recent Labs  Lab 02/27/18 1419  NA 135  K 4.1  CL 103  CO2 20*  GLUCOSE 67*  BUN 63*  CREATININE 8.23*  CALCIUM 8.8*   Liver Function Tests: No results for input(s): AST, ALT, ALKPHOS, BILITOT, PROT, ALBUMIN in the last 168 hours. No results for input(s): LIPASE, AMYLASE in the last 168 hours. No results for input(s): AMMONIA in the last 168 hours. CBC: Recent Labs  Lab 02/27/18 1419  WBC 9.0  NEUTROABS 5.9  HGB 8.4*  HCT 27.9*  MCV 93.6  PLT 307   Cardiac Enzymes: No results for input(s): CKTOTAL, CKMB, CKMBINDEX, TROPONINI in the last 168 hours.  BNP (last 3 results) Recent Labs    02/27/18 1419  BNP 889.6*    ProBNP (last 3 results) No results for input(s): PROBNP in the last 8760 hours.  CBG: No results  for input(s): GLUCAP in the last 168 hours.  Radiological Exams on Admission: Dg Chest 2 View  Result Date: 02/27/2018 CLINICAL DATA:  Bilateral lower extremity edema  and lightheadedness. EXAM: CHEST - 2 VIEW COMPARISON:  February 25, 2018 FINDINGS: The heart size is enlarged. The aorta is tortuous. The mediastinal contour is stable. There is pulmonary edema. There is no focal pneumonia or pleural effusion. The bony structures are stable. IMPRESSION: Mild congestive heart failure.  No focal pneumonia. Electronically Signed   By: Abelardo Diesel M.D.   On: 02/27/2018 15:37    EKG: None  Assessment/Plan Present on Admission: . Essential hypertension . Chronic kidney disease (CKD) stage G4/A1, severely decreased glomerular filtration rate (GFR) between 15-29 mL/min/1.73 square meter and albuminuria creatinine ratio less than 30 mg/g (HCC) . Symptomatic anemia . 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 2 diabetes mellitus ([place]) . GERD (gastroesophageal reflux disease) . COPD (chronic obstructive pulmonary disease) ([place]) . Fluid overload  Active Problems:   Type 2 diabetes mellitus (HCC)   Essential hypertension   Chronic kidney disease (CKD) stage G4/A1, severely decreased glomerular filtration rate (GFR) between 15-29 mL/min/1.73 square meter and albuminuria creatinine ratio less than 30 mg/g (HCC)   GERD (gastroesophageal reflux disease)   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 2 diabetes mellitus (HCC)   COPD (chronic obstructive pulmonary disease) (HCC)   Symptomatic anemia   Fluid overload   1.  Pulmonary edema/fluid overload chest x-ray showing pulmonary edema no consolidation felt right.  Nephrology has been consulted to advise IV Lasix but patient was not responsive to IV Lasix.  Will follow patient up  2.  Anemia of chronic disease we will monitor  4.  Upper respiratory infection versus bronchitis.  She completed outpatient Zithromax with no improvement.  I will continue her on her Flonase loratadine as well as add benzonatate for symptomatic relief of her cough  4.  GERD we will continue her PPI  5.  Type 2 diabetes mellitus we will maintain low-carb diet continue her on her  insulin  Severity of Illness: The appropriate patient status for this patient is INPATIENT. Inpatient status is judged to be reasonable and necessary in order to provide the required intensity of service to ensure the patient's safety. The patient's presenting symptoms, physical exam findings, and initial radiographic and laboratory data in the context of their chronic comorbidities is felt to place them at high risk for further clinical deterioration. Furthermore, it is not anticipated that the patient will be medically stable for discharge from the hospital within 2 midnights of admission. The following factors support the patient status of inpatient.   " The patient's presenting symptoms include cough fluid overload. " The worrisome physical exam findings include fluid overload bilateral edema. " The initial radiographic and laboratory data are worrisome because of BNP 888, chest x-ray shows pulmonary edema. " The chronic co-morbidities include chronic kidney disease stage V.   * I certify that at the point of admission it is my clinical judgment that the patient will require inpatient hospital care spanning beyond 2 midnights from the point of admission due to high intensity of service, high risk for further deterioration and high frequency of surveillance required.*    DVT prophylaxis: Heparin  Code Status: Full  Family Communication: Sister at bedside  Disposition Plan: Home when stable  Consults called: Nephrology consult by ED doc  Admission status: Patient    Cristal Deer MD [facility] Pager [phone]  If 7PM-7AM, please contact  night-coverage [email or web address] Password [email or web address]  02/27/2018, 8:37 PM

## 2018-02-27 NOTE — ED Notes (Signed)
CALLED [facility] FOR PT CONSULT.

## 2018-02-27 NOTE — ED Provider Notes (Signed)
Medical Screening Examination  Chief Complaint: Bilateral Leg Swelling  HPI:   Patient reports that she was sent in today by her primary care provider for "IV Lasix"for bilateral lower extremity swelling.  Patient states the swelling is been present for multiple months, equal bilaterally without pain.  She states that she has been using p.o. Lasix without relief. Additionally patient reports that she has been having an upper respiratory tract infection for the past 3 weeks with cough, minimally reductive with white sputum.  She states that she has been recently started on a azithromycin pack for her symptoms with minimal relief. Patient denies any and all pain.  She denies chest pain or shortness of breath. Finally patient states that she has been feeling intermittently lightheaded for the past 2 days, she states that she thinks this is due to to her recent illness.  She denies syncope, vision changes, weakness or tingling of the extremities or difficulty speaking.  ROS: Cardiopulmonary: Denies chest pain, denies shortness of breath, denies hemoptysis, endorses cough, endorses bilateral lower extremity edema.  Physical Exam:   Gen: No distress  Neuro: Awake and Alert  Skin: Warm    Focused Exam: Bilateral 1+ pitting edema.  Pedal pulses intact bilaterally.  Negative Homans sign.  Lungs with mild expiratory wheezing bilaterally, questionable rhonchi versus upper airway sounds of the right lung.  Pulse rate in the 70s bpm, SPO2 98% on room air.  Patient is not in acute distress.  Moving all extremities spontaneously.  No focal neuro deficits on examination.   Initiation of care has begun. The patient has been counseled on the process, plan, and necessity for staying for the completion/evaluation, and the remainder of the medical screening examination    Gari Crown 02/27/18 [place]    Maudie Flakes, MD 02/27/18 2105

## 2018-02-27 NOTE — ED Provider Notes (Signed)
[facility] Emergency Department Provider Note MRN:  [id number]  Arrival date & time: 02/27/18     Chief Complaint   Cough and Leg Swelling   History of Present Illness   Jodi Bell is a 58 y.o. year-old female with a history of CKD, asthma, COPD, diabetes presenting to the ED with chief complaint of cough.  3 weeks of persistent cough, more recently with shortness of breath, dizziness today.  Patient explains she normally has some swelling to her ankles bilaterally, thinks it is a bit worse for the past few days.  Denies headache or vision change, endorsing subjective fevers today.  No neck pain, no chest pain.  No abdominal pain, no dysuria.  Multiple sick contacts at home.  No exacerbating relieving factors.  Review of Systems  A complete 10 system review of systems was obtained and all systems are negative except as noted in the HPI and PMH.   Patient's Health History    Past Medical History:  Diagnosis Date  . Anemia   . Arthritis    oa and psoriatic ra  . Asthma   . Back pain, chronic    lower back  . Candida infection finished tx 2 days ago  . Chronic insomnia   . Chronic kidney disease    stage 3 ckd last ov nephrology 04-29-17 epic  . Chronic kidney disease (CKD) stage G4/A1, severely decreased glomerular filtration rate (GFR) between 15-29 mL/min/1.73 square meter and albuminuria creatinine ratio less than 30 mg/g (HCC)   . COPD (chronic obstructive pulmonary disease) ([place])   . Diabetes mellitus    type 2  . Eczema   . Essential hypertension   . GERD (gastroesophageal reflux disease)   . History of hiatal hernia   . Obesity   . Pneumonia few yrs ago x 2    Past Surgical History:  Procedure Laterality Date  . BACK SURGERY  2016   lower back fusion with cage  . BIOPSY  09/23/2017   Procedure: BIOPSY;  Surgeon: Wonda Horner, MD;  [facility];  Service: Endoscopy;;  . CESAREAN SECTION  1990   x 1   . COLONOSCOPY WITH PROPOFOL  N/A 09/23/2017   Procedure: COLONOSCOPY WITH PROPOFOL Hemostatic clips placed;  Surgeon: Wonda Horner, MD;  [facility];  Service: Endoscopy;  Laterality: N/A;  . DILATION AND CURETTAGE OF UTERUS  1988  . ESOPHAGOGASTRODUODENOSCOPY (EGD) WITH PROPOFOL N/A 09/23/2017   Procedure: ESOPHAGOGASTRODUODENOSCOPY (EGD) WITH PROPOFOL;  Surgeon: Wonda Horner, MD;  [facility];  Service: Endoscopy;  Laterality: N/A;  . PARTIAL KNEE ARTHROPLASTY Left 11/12/2017   Procedure: left unicompartmental arthroplasty-medial;  Surgeon: Paralee Cancel, MD;  [facility];  Service: Orthopedics;  Laterality: Left;  14min  . SUBMUCOSAL INJECTION  09/23/2017   Procedure: SUBMUCOSAL INJECTION;  Surgeon: Wonda Horner, MD;  [facility];  Service: Endoscopy;;  in colon  . TUBAL LIGATION  1990    Family History  Problem Relation Age of Onset  . Lung cancer Mother   . Diabetes Sister   . Heart disease Sister   . Thyroid cancer Other        1/2 sister  . Heart disease Other        1/2 sister    Social History   Socioeconomic History  . Marital status: Divorced    Spouse name: Not on file  . Number of children: Not on file  . Years of education: Not on  file  . Highest education level: Not on file  Occupational History  . Not on file  Social Needs  . Financial resource strain: Not on file  . Food insecurity:    Worry: Not on file    Inability: Not on file  . Transportation needs:    Medical: Not on file    Non-medical: Not on file  Tobacco Use  . Smoking status: Current Every Day Smoker    Packs/day: 0.50    Years: 40.00    Pack years: 20.00    Types: Cigarettes    Last attempt to quit: 02/03/2014    Years since quitting: 4.0  . Smokeless tobacco: Never Used  . Tobacco comment: Pt quit  a week ago, per 11-06-17 PAT appt  Substance and Sexual Activity  . Alcohol use: Yes    Comment: occ  . Drug use: No  . Sexual activity: Yes    Birth control/protection: Surgical   Lifestyle  . Physical activity:    Days per week: Not on file    Minutes per session: Not on file  . Stress: Not on file  Relationships  . Social connections:    Talks on phone: Not on file    Gets together: Not on file    Attends religious service: Not on file    Active member of club or organization: Not on file    Attends meetings of clubs or organizations: Not on file    Relationship status: Not on file  . Intimate partner violence:    Fear of current or ex partner: Not on file    Emotionally abused: Not on file    Physically abused: Not on file    Forced sexual activity: Not on file  Other Topics Concern  . Not on file  Social History Narrative  . Not on file     Physical Exam  Vital Signs and Nursing Notes reviewed Vitals:   02/27/18 1730 02/27/18 1800  BP: (!) 162/79 (!) 167/84  Pulse: 76 77  Resp: 15 15  Temp:    SpO2: 99% 100%    CONSTITUTIONAL: Bell-appearing, NAD NEURO:  Alert and oriented x 3, no focal deficits EYES:  eyes equal and reactive ENT/NECK:  no LAD, no JVD CARDIO: Regular rate, Bell-perfused, normal S1 and S2 PULM:  CTAB no wheezing or rhonchi GI/GU:  normal bowel sounds, non-distended, non-tender MSK/SPINE:  No gross deformities, 1+ pitting edema to bilateral ankles SKIN:  no rash, atraumatic PSYCH:  Appropriate speech and behavior  Diagnostic and Interventional Summary    EKG Interpretation  Date/Time:  Saturday February 27 2018 14:17:57 EST Ventricular Rate:  77 PR Interval:    QRS Duration: 84 QT Interval:  414 QTC Calculation: 469 R Axis:   2 Text Interpretation:  Sinus rhythm Low voltage, precordial leads Confirmed by Gerlene Fee [phone]) on 02/27/2018 3:17:24 PM      Labs Reviewed  CBC WITH DIFFERENTIAL/PLATELET - Abnormal; Notable for the following components:      Result Value   RBC 2.98 (*)    Hemoglobin 8.4 (*)    HCT 27.9 (*)    All other components within normal limits  BASIC METABOLIC PANEL - Abnormal; Notable  for the following components:   CO2 20 (*)    Glucose, Bld 67 (*)    BUN 63 (*)    Creatinine, Ser 8.23 (*)    Calcium 8.8 (*)    GFR calc non Af Amer 5 (*)    GFR  calc Af Amer 6 (*)    All other components within normal limits  BRAIN NATRIURETIC PEPTIDE - Abnormal; Notable for the following components:   B Natriuretic Peptide 889.6 (*)    All other components within normal limits  INFLUENZA PANEL BY PCR (TYPE A & B)  URINALYSIS, ROUTINE W REFLEX MICROSCOPIC  I-STAT TROPONIN, ED    DG Chest 2 View  Final Result      Medications  ipratropium-albuterol (DUONEB) 0.5-2.5 (3) MG/3ML nebulizer solution 3 mL (3 mLs Nebulization Given 02/27/18 1505)  furosemide (LASIX) injection 40 mg (40 mg Intravenous Given 02/27/18 1708)     Procedures Critical Care  ED Course and Medical Decision Making  I have reviewed the triage vital signs and the nursing notes.  Pertinent labs & imaging results that were available during my care of the patient were reviewed by me and considered in my medical decision making (see below for details).  Favoring viral process, possibly influenza, long duration, now getting worse, also considering secondary bacterial pneumonia, labs and chest x-ray pending.  Patient had a recent admission back in November for worsening kidney function, required transfusion.  Clinical Course as of Feb 27 2102  Sat Feb 27, 2018  1640 Was unable to reach her specific nephrologist, spoke with Dr. Jimmy Footman of nephrology here, who was not concerned about any significant AKI based on her labs today.  Advised IV Lasix.   [MB]    Clinical Course User Index [MB] Maudie Flakes, MD    Patient did not make any urine after IV Lasix trial, admitted to hospital service for further care.  Barth Kirks. Sedonia Small, MD [place] [email or web address]  Final Clinical Impressions(s) / ED Diagnoses     ICD-10-CM   1. Stage 5 chronic kidney disease not  on chronic dialysis ([place]) N18.5   2. Hypervolemia, unspecified hypervolemia type E87.70     ED Discharge Orders    None         Maudie Flakes, MD 02/27/18 2104

## 2018-02-28 DIAGNOSIS — I10 Essential (primary) hypertension: Secondary | ICD-10-CM

## 2018-02-28 DIAGNOSIS — Z794 Long term (current) use of insulin: Secondary | ICD-10-CM

## 2018-02-28 LAB — HEMOGLOBIN A1C
Hgb A1c MFr Bld: 5.5 % (ref 4.8–5.6)
Mean Plasma Glucose: 111.15 mg/dL

## 2018-02-28 LAB — CBC
HCT: 24.1 % — ABNORMAL LOW (ref 36.0–46.0)
Hemoglobin: 7.4 g/dL — ABNORMAL LOW (ref 12.0–15.0)
MCH: 28.4 pg (ref 26.0–34.0)
MCHC: 30.7 g/dL (ref 30.0–36.0)
MCV: 92.3 fL (ref 80.0–100.0)
Platelets: 242 10*3/uL (ref 150–400)
RBC: 2.61 MIL/uL — ABNORMAL LOW (ref 3.87–5.11)
RDW: 15.4 % (ref 11.5–15.5)
WBC: 6.3 10*3/uL (ref 4.0–10.5)
nRBC: 0 % (ref 0.0–0.2)

## 2018-02-28 LAB — BASIC METABOLIC PANEL
Anion gap: 10 (ref 5–15)
BUN: 62 mg/dL — ABNORMAL HIGH (ref 6–20)
CO2: 20 mmol/L — ABNORMAL LOW (ref 22–32)
Calcium: 8.5 mg/dL — ABNORMAL LOW (ref 8.9–10.3)
Chloride: 105 mmol/L (ref 98–111)
Creatinine, Ser: 8.06 mg/dL — ABNORMAL HIGH (ref 0.44–1.00)
GFR calc Af Amer: 6 mL/min — ABNORMAL LOW (ref >60)
GFR calc non Af Amer: 5 mL/min — ABNORMAL LOW (ref >60)
Glucose, Bld: 70 mg/dL (ref 70–99)
Potassium: 3.9 mmol/L (ref 3.5–5.1)
Sodium: 135 mmol/L (ref 135–145)

## 2018-02-28 LAB — MAGNESIUM: Magnesium: 1.8 mg/dL (ref 1.7–2.4)

## 2018-02-28 LAB — GLUCOSE, CAPILLARY
Glucose-Capillary: 74 mg/dL (ref 70–99)
Glucose-Capillary: 110 mg/dL — ABNORMAL HIGH (ref 70–99)
Glucose-Capillary: 99 mg/dL (ref 70–99)

## 2018-02-28 MED ORDER — INSULIN ASPART 100 UNIT/ML ~~LOC~~ SOLN: 0.0000 [IU] | Freq: Three times a day (TID) | SUBCUTANEOUS | Status: DC

## 2018-02-28 MED ORDER — NITROGLYCERIN 2 % TD OINT
0.5000 [in_us] | TOPICAL_OINTMENT | Freq: Four times a day (QID) | TRANSDERMAL | Status: DC
  Administered 2018-02-28 – 2018-03-01 (×4): 0.5 [in_us] via TOPICAL
  Filled 2018-02-28: qty 30

## 2018-02-28 MED ORDER — HYDRALAZINE HCL 50 MG PO TABS
100.0000 mg | ORAL_TABLET | Freq: Three times a day (TID) | ORAL | Status: DC
  Administered 2018-02-28 – 2018-03-01 (×3): 100 mg via ORAL
  Filled 2018-02-28 (×3): qty 2

## 2018-02-28 MED ORDER — FUROSEMIDE 10 MG/ML IJ SOLN
160.0000 mg | Freq: Two times a day (BID) | INTRAVENOUS | Status: DC
  Administered 2018-02-28 – 2018-03-01 (×3): 160 mg via INTRAVENOUS
  Filled 2018-02-28: qty 12
  Filled 2018-02-28: qty 16
  Filled 2018-02-28: qty 2
  Filled 2018-02-28: qty 16

## 2018-02-28 MED ORDER — INSULIN GLARGINE 100 UNIT/ML ~~LOC~~ SOLN
10.0000 [IU] | Freq: Every day | SUBCUTANEOUS | Status: DC
  Filled 2018-02-28: qty 0.1

## 2018-02-28 MED ORDER — BENZONATATE 100 MG PO CAPS
200.0000 mg | ORAL_CAPSULE | Freq: Two times a day (BID) | ORAL | Status: DC | PRN
  Administered 2018-02-28: 200 mg via ORAL
  Filled 2018-02-28: qty 2

## 2018-02-28 NOTE — Progress Notes (Signed)
PROGRESS NOTE                                                                                                                                                                                                             Patient Demographics:    Jodi Bell, is a 58 y.o. female, DOB - [birth year], VEL:[id number]  Admit date - 02/27/2018   Admitting Physician Cristal Deer, MD  Outpatient Primary MD for the patient is [facility] [place]  LOS - 1  Chief Complaint  Patient presents with  . Cough  . Leg Swelling       Brief Narrative   Jodi Bell is a 59 y.o. female with medical history significant for end-stage renal disease stage V now approaching ESRD, hypertension, anemia of chronic disease, mild chronic low back pain, COPD, hiatal hernia, recent left knee surgery in November who presented to the emergency room with cough, shortness of breath and some edema.  She was diagnosed with CHF and admitted to the hospital.   Subjective:    Jodi Bell today has, No headache, No chest pain, No abdominal pain - No Nausea, No new weakness tingling or numbness, No Cough - Improved SOB.     Assessment  & Plan :     1.  Acute on chronic diastolic CHF with EF of 55 to 60% on echocardiogram done in November 2019.  She also has advanced kidney disease now approaching ESRD, did not respond well to low-dose IV diuretic, discussed the case with nephrologist on-call Dr. Jimmy Footman.  Currently no uremic signs and no indication for HD, will be placed on high-dose IV Lasix today, will monitor electrolytes, intake output and symptoms.  So encouraged to increase activity.  2.  CKD 5 now approaching ESRD.  Supportive care as above, no indication for HD, outpatient nephrology follow-up with her primary nephrologist in [facility] after discharge.  3.  Hypertension.  In poor control, continue home medications, hydralazine dose increased, Nitropaste added.  4.  COPD.  At baseline continue  supportive care.  No wheezing.  5.  Anemia of chronic disease.  Due to underlying renal insufficiency.  Monitor no indication for transfusion.  6.  Gout.  On allopurinol.  7.  DM type II.  On Lantus dose decreased due to nighttime hypoglycemia, will monitor with low-dose with meals sliding scale only.  Check A1c.  He is also on chronic prednisone patient does not recall exactly why.  Home dose continued.  Lab Results  Component Value Date   HGBA1C 5.7 (H) 11/06/2017     CBG (last 3)  Recent Labs    02/27/18 2159  GLUCAP 66*      Family Communication  :  None  Code Status :  Full  Disposition Plan  :  Home in am if better  Consults  :  Dr Deterding Renal over the phone  Procedures  :      DVT Prophylaxis  :   Heparin    Lab Results  Component Value Date   PLT 242 02/28/2018    Diet :  Diet Order            Diet renal with fluid restriction Fluid restriction: 1200 mL Fluid; Room service appropriate? Yes; Fluid consistency: Thin  Diet effective now               Inpatient Medications Scheduled Meds: . allopurinol  150 mg Oral Daily  . amLODipine  10 mg Oral Daily  . bisoprolol  20 mg Oral Daily  . cloNIDine  0.2 mg Oral BID  . ferrous sulfate  325 mg Oral Daily  . fluticasone  1 spray Each Nare Daily  . heparin  5,000 Units Subcutaneous Q8H  . hydrALAZINE  50 mg Oral Q8H  . insulin glargine  14 Units Subcutaneous Daily  . linagliptin  5 mg Oral Daily  . loratadine  10 mg Oral Daily  . montelukast  10 mg Oral QHS  . multivitamin with minerals  1 tablet Oral Daily  . pantoprazole  40 mg Oral Daily  . predniSONE  2.5 mg Oral Q breakfast  . senna-docusate  2 tablet Oral QHS  . [START ON 03/02/2018] Vitamin D (Ergocalciferol)  50,000 Units Oral Q Tue   Continuous Infusions: . furosemide     PRN Meds:.acetaminophen, albuterol, benzocaine, gabapentin, methocarbamol, oxyCODONE, zolpidem  Antibiotics  :   Anti-infectives (From admission, onward)    None          Objective:   Vitals:   02/27/18 1730 02/27/18 1800 02/27/18 2144 02/28/18 0445  BP: (!) 162/79 (!) 167/84 (!) 186/86 (!) 188/89  Pulse: 76 77 80 77  Resp: 15 15 17 20   Temp:   98.6 F (37 C) 99 F (37.2 C)  TempSrc:   Oral Oral  SpO2: 99% 100% 100% 97%  Weight:   84 kg   Height:   5' (1.524 m)     Wt Readings from Last 3 Encounters:  02/27/18 84 kg  12/14/17 78.5 kg  11/12/17 81.2 kg     Intake/Output Summary (Last 24 hours) at 02/28/2018 1045 Last data filed at 02/28/2018 1000 Gross per 24 hour  Intake 660 ml  Output -  Net 660 ml     Physical Exam  Awake Alert, Oriented X 3, No new F.N deficits, Normal affect [place].AT,PERRAL Supple Neck,No JVD, No cervical lymphadenopathy appriciated.  Symmetrical Chest wall movement, Good air movement bilaterally, few rales RRR,No Gallops,Rubs or new Murmurs, No Parasternal Heave +ve B.Sounds, Abd Soft, No tenderness, No organomegaly appriciated, No rebound - guarding or rigidity. No Cyanosis, Clubbing or edema, No new Rash or bruise       Data Review:    CBC Recent Labs  Lab 02/27/18 1419 02/28/18 0605  WBC 9.0 6.3  HGB 8.4* 7.4*  HCT 27.9* 24.1*  PLT 307 242  MCV 93.6 92.3  MCH 28.2 28.4  MCHC 30.1 30.7  RDW 15.2 15.4  LYMPHSABS 2.1  --  MONOABS 0.6  --   EOSABS 0.4  --   BASOSABS 0.0  --     Chemistries  Recent Labs  Lab 02/27/18 1419 02/28/18 0605  NA 135 135  K 4.1 3.9  CL 103 105  CO2 20* 20*  GLUCOSE 67* 70  BUN 63* 62*  CREATININE 8.23* 8.06*  CALCIUM 8.8* 8.5*  MG  --  1.8   ------------------------------------------------------------------------------------------------------------------ No results for input(s): CHOL, HDL, LDLCALC, TRIG, CHOLHDL, LDLDIRECT in the last 72 hours.  Lab Results  Component Value Date   HGBA1C 5.7 (H) 11/06/2017   ------------------------------------------------------------------------------------------------------------------ No results for  input(s): TSH, T4TOTAL, T3FREE, THYROIDAB in the last 72 hours.  Invalid input(s): FREET3 ------------------------------------------------------------------------------------------------------------------ No results for input(s): VITAMINB12, FOLATE, FERRITIN, TIBC, IRON, RETICCTPCT in the last 72 hours.  Coagulation profile No results for input(s): INR, PROTIME in the last 168 hours.  No results for input(s): DDIMER in the last 72 hours.  Cardiac Enzymes No results for input(s): CKMB, TROPONINI, MYOGLOBIN in the last 168 hours.  Invalid input(s): CK ------------------------------------------------------------------------------------------------------------------    Component Value Date/Time   BNP 889.6 (H) 02/27/2018 1419    Micro Results No results found for this or any previous visit (from the past 240 hour(s)).  Radiology Reports Dg Chest 2 View  Result Date: 02/27/2018 CLINICAL DATA:  Bilateral lower extremity edema and lightheadedness. EXAM: CHEST - 2 VIEW COMPARISON:  February 25, 2018 FINDINGS: The heart size is enlarged. The aorta is tortuous. The mediastinal contour is stable. There is pulmonary edema. There is no focal pneumonia or pleural effusion. The bony structures are stable. IMPRESSION: Mild congestive heart failure.  No focal pneumonia. Electronically Signed   By: Abelardo Diesel M.D.   On: 02/27/2018 15:37    Time Spent in minutes  30   Lala Lund M.D on 02/28/2018 at 10:45 AM  To page go to [email or web address] - password [email or web address]

## 2018-03-01 LAB — BASIC METABOLIC PANEL
Anion gap: 12 (ref 5–15)
BUN: 63 mg/dL — ABNORMAL HIGH (ref 6–20)
CO2: 19 mmol/L — ABNORMAL LOW (ref 22–32)
Calcium: 8.5 mg/dL — ABNORMAL LOW (ref 8.9–10.3)
Chloride: 103 mmol/L (ref 98–111)
Creatinine, Ser: 8.47 mg/dL — ABNORMAL HIGH (ref 0.44–1.00)
GFR calc Af Amer: 5 mL/min — ABNORMAL LOW (ref >60)
GFR calc non Af Amer: 5 mL/min — ABNORMAL LOW (ref >60)
Glucose, Bld: 73 mg/dL (ref 70–99)
Potassium: 3.8 mmol/L (ref 3.5–5.1)
Sodium: 134 mmol/L — ABNORMAL LOW (ref 135–145)

## 2018-03-01 LAB — GLUCOSE, CAPILLARY: Glucose-Capillary: 56 mg/dL — ABNORMAL LOW (ref 70–99)

## 2018-03-01 LAB — MAGNESIUM: Magnesium: 1.8 mg/dL (ref 1.7–2.4)

## 2018-03-01 MED ORDER — POTASSIUM CHLORIDE CRYS ER 20 MEQ PO TBCR
20.0000 meq | EXTENDED_RELEASE_TABLET | Freq: Once | ORAL | Status: AC
  Administered 2018-03-01: 20 meq via ORAL
  Filled 2018-03-01: qty 1

## 2018-03-01 MED ORDER — METOLAZONE 2.5 MG PO TABS
2.5000 mg | ORAL_TABLET | Freq: Once | ORAL | Status: AC
  Administered 2018-03-01: 2.5 mg via ORAL
  Filled 2018-03-01: qty 1

## 2018-03-01 MED ORDER — ISOSORBIDE MONONITRATE ER 60 MG PO TB24: 60.0000 mg | ORAL_TABLET | Freq: Every day | ORAL | 0 refills | Status: DC

## 2018-03-01 MED ORDER — POTASSIUM CHLORIDE ER 10 MEQ PO TBCR: 10.0000 meq | EXTENDED_RELEASE_TABLET | Freq: Every day | ORAL | 0 refills | Status: DC

## 2018-03-01 MED ORDER — ISOSORBIDE MONONITRATE ER 60 MG PO TB24
60.0000 mg | ORAL_TABLET | Freq: Every day | ORAL | Status: DC
  Administered 2018-03-01: 60 mg via ORAL
  Filled 2018-03-01: qty 1

## 2018-03-01 MED ORDER — FUROSEMIDE 40 MG PO TABS: 80.0000 mg | ORAL_TABLET | Freq: Two times a day (BID) | ORAL | 0 refills | Status: DC

## 2018-03-01 MED ORDER — SPIRONOLACTONE 25 MG PO TABS: 25.0000 mg | ORAL_TABLET | Freq: Every day | ORAL | Status: DC

## 2018-03-01 MED ORDER — BENZONATATE 100 MG PO CAPS: 100.0000 mg | ORAL_CAPSULE | Freq: Three times a day (TID) | ORAL | 0 refills | Status: DC | PRN

## 2018-03-01 NOTE — Discharge Instructions (Signed)
Follow with Primary MD [facility] and your Nephrologist in 3-4 days   Get CBC, CMP checked  by Primary MD in 3-4 days   Activity: As tolerated with Full fall precautions use walker/cane & assistance as needed  Disposition Home   Diet: Renal, 1.2 lit/day fluid restriction  Special Instructions: If you have smoked or chewed Tobacco  in the last 2 yrs please stop smoking, stop any regular Alcohol  and or any Recreational drug use.  On your next visit with your primary care physician please Get Medicines reviewed and adjusted.  Please request your Prim.MD to go over all Hospital Tests and Procedure/Radiological results at the follow up, please get all Hospital records sent to your Prim MD by signing hospital release before you go home.  If you experience worsening of your admission symptoms, develop shortness of breath, life threatening emergency, suicidal or homicidal thoughts you must seek medical attention immediately by calling 911 or calling your MD immediately  if symptoms less severe.  You Must read complete instructions/literature along with all the possible adverse reactions/side effects for all the Medicines you take and that have been prescribed to you. Take any new Medicines after you have completely understood and accpet all the possible adverse reactions/side effects.

## 2018-03-01 NOTE — Discharge Summary (Signed)
Jodi Bell WNU:[id number] DOB: 1960-10-04 DOA: 02/27/2018  PCP: [facility]  Admit date: 02/27/2018  Discharge date: 03/01/2018  Admitted From: Home   Disposition:  Home  Recommendations for Outpatient Follow-up:   Follow up with PCP in 1-2 weeks  PCP Please obtain BMP/CBC, 2 view CXR in 1week,  (see Discharge instructions)   PCP Please follow up on the following pending results:    Home Health: None   Equipment/Devices: None  Consultations: None Discharge Condition: Stable   CODE STATUS: Full   Diet Recommendation: Renal, 1.2 lit/day fluid restriction  Chief Complaint  Patient presents with  . Cough  . Leg Swelling     Brief history of present illness from the day of admission and additional interim summary    Jodi T Tayloris a 58 y.o.femalewith medical history significant forend-stage renal disease stage V now approaching ESRD, hypertension, anemia of chronic disease, mild chronic low back pain, COPD, hiatal hernia, recent left knee surgery in November who presented to the emergency room with cough, shortness of breath and some edema.  She was diagnosed with CHF and admitted to the hospital.                                                                 Hospital Course   1.  Acute on chronic diastolic CHF with EF of 55 to 60% on echocardiogram done in November 2019.  She also has advanced kidney disease now approaching ESRD, did not respond well to low-dose IV diuretic, she was subsequently put on Lasix 160 mg IV twice daily along with a dose of Zaroxolyn after which she had excellent diuresis, symptoms are much improved and she is at baseline on room air now, she has been educated on salt and fluid restriction at home, she will be placed on oral diuretic and requested to follow with  PCP and her primary nephrologist in [facility] within the next 2 to 3 days.  Twice PCP to monitor her weight, BMP and diuretic dose closely.  2.  CKD 5 now approaching ESRD.  Supportive care as above, no indication for HD, outpatient nephrology follow-up with her primary nephrologist in [facility] after discharge.  He already has left arm AV fistula/graft placed in November.  3.  Hypertension.  In poor control, continue home medications I have added Imdur 60 mg daily for better control, PCP to monitor and adjust.  4.  COPD.  At baseline continue supportive care.  No wheezing.  5.  Anemia of chronic disease.  Due to underlying renal insufficiency.  Monitor no indication for transfusion.  6.  Gout.  On allopurinol.  7.  DM type II.    Continue home regimen.  8.  History of psoriatic arthritis.  Patient takes 2.5 mg of prednisone daily  for it.  Currently stable.  Discharge diagnosis     Active Problems:   Type 2 diabetes mellitus (HCC)   Essential hypertension   Chronic kidney disease (CKD) stage G4/A1, severely decreased glomerular filtration rate (GFR) between 15-29 mL/min/1.73 square meter and albuminuria creatinine ratio less than 30 mg/g (HCC)   GERD (gastroesophageal reflux disease)   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 2 diabetes mellitus (HCC)   COPD (chronic obstructive pulmonary disease) (HCC)   Symptomatic anemia   Fluid overload    Discharge instructions    Discharge Instructions    Diet - low sodium heart healthy   Complete by:  As directed    Discharge instructions   Complete by:  As directed    Follow with Primary MD [facility] [place] and your Nephrologist in 3-4 days   Get CBC, CMP checked  by Primary MD in 3-4 days   Activity: As tolerated with Full fall precautions use walker/cane & assistance as needed  Disposition Home   Diet: Renal, 1.2 lit/day fluid restriction  Special Instructions: If you have smoked or chewed Tobacco  in the last 2 yrs please stop  smoking, stop any regular Alcohol  and or any Recreational drug use.  On your next visit with your primary care physician please Get Medicines reviewed and adjusted.  Please request your Prim.MD to go over all Hospital Tests and Procedure/Radiological results at the follow up, please get all Hospital records sent to your Prim MD by signing hospital release before you go home.  If you experience worsening of your admission symptoms, develop shortness of breath, life threatening emergency, suicidal or homicidal thoughts you must seek medical attention immediately by calling 911 or calling your MD immediately  if symptoms less severe.  You Must read complete instructions/literature along with all the possible adverse reactions/side effects for all the Medicines you take and that have been prescribed to you. Take any new Medicines after you have completely understood and accpet all the possible adverse reactions/side effects.   Increase activity slowly   Complete by:  As directed       Discharge Medications   Allergies as of 03/01/2018      Reactions   Adalimumab Other (See Comments)   Blisters on abdomen =humira   Allyl Isothiocyanate Swelling   SPICEY MUSTARD SWELLING OF TONGUE AND MOUTH   Banana Swelling   TONGUE AND MOUTH SWELLING   Ezetimibe Other (See Comments)   Ibuprofen Other (See Comments)   Renal Problems   Leflunomide And Related Other (See Comments)   Severe Headache   Lexapro [escitalopram Oxalate] Other (See Comments)   Hand problems - Serotonin Syndrome    Lisinopril Swelling   Angioedema    Other Other (See Comments)   Cosentyx= angioedema   Pistachio Nut (diagnostic) Itching   Mouth itches and tongue swelling Hazel nuts and pecans also   Statins Other (See Comments)   Leg pains and weakness   Trazodone And Nefazodone Other (See Comments)   Serotonin Syndrome       Medication List    STOP taking these medications   aspirin 325 MG tablet   azithromycin 250  MG tablet Commonly known as:  ZITHROMAX   methocarbamol 500 MG tablet Commonly known as:  ROBAXIN     TAKE these medications   allopurinol 300 MG tablet Commonly known as:  ZYLOPRIM Take 150 mg by mouth daily.   amLODipine 10 MG tablet Commonly known as:  NORVASC Take 10 mg  by mouth daily.   benzonatate 100 MG capsule Commonly known as:  TESSALON PERLES Take 1 capsule (100 mg total) by mouth 3 (three) times daily as needed for cough.   bisoprolol 10 MG tablet Commonly known as:  ZEBETA Take 2 tablets (20 mg total) by mouth daily.   cetirizine 10 MG tablet Commonly known as:  ZYRTEC Take 20 mg by mouth 2 (two) times daily.   cloNIDine 0.2 MG tablet Commonly known as:  CATAPRES Take 0.2 mg by mouth 2 (two) times daily.   EPINEPHrine 0.3 mg/0.3 mL Soaj injection Commonly known as:  EPIPEN Inject 0.3 mLs (0.3 mg total) into the muscle once. What changed:    when to take this  reasons to take this   esomeprazole 20 MG capsule Commonly known as:  NEXIUM Take 20 mg by mouth every evening.   ferrous sulfate 325 (65 FE) MG tablet Take 325 mg by mouth daily.   fluticasone 50 MCG/ACT nasal spray Commonly known as:  FLONASE Place 1 spray into both nostrils daily as needed for allergies or rhinitis.   folic acid 353 MCG tablet Commonly known as:  FOLVITE Take 400 mcg by mouth daily.   furosemide 40 MG tablet Commonly known as:  LASIX Take 40 mg by mouth 2 (two) times daily. What changed:  Another medication with the same name was added. Make sure you understand how and when to take each.   furosemide 40 MG tablet Commonly known as:  LASIX Take 2 tablets (80 mg total) by mouth 2 (two) times daily. What changed:  You were already taking a medication with the same name, and this prescription was added. Make sure you understand how and when to take each.   gabapentin 300 MG capsule Commonly known as:  NEURONTIN Take 1 capsule (300 mg total) by mouth at bedtime as  needed (Neuropathic pain).   hydrALAZINE 50 MG tablet Commonly known as:  APRESOLINE Take 1 tablet (50 mg total) by mouth every 8 (eight) hours.   isosorbide mononitrate 60 MG 24 hr tablet Commonly known as:  IMDUR Take 1 tablet (60 mg total) by mouth daily. Start taking on:  March 02, 2018   JANUVIA 100 MG tablet Generic drug:  sitaGLIPtin Take 100 mg by mouth daily.   montelukast 10 MG tablet Commonly known as:  SINGULAIR Take 10 mg by mouth at bedtime.   multivitamin tablet Take 1 tablet by mouth daily.   Oxycodone HCl 10 MG Tabs Take 1-2 tablets (10-20 mg total) by mouth every 6 (six) hours as needed for severe pain (pain score 7-10). What changed:  how much to take   potassium chloride 10 MEQ tablet Commonly known as:  K-DUR Take 1 tablet (10 mEq total) by mouth daily.   predniSONE 2.5 MG tablet Commonly known as:  DELTASONE Take 2.5 mg by mouth daily with breakfast.   sodium bicarbonate 650 MG tablet Take 650 mg by mouth 2 (two) times daily.   TRESIBA FLEXTOUCH 200 UNIT/ML Sopn Generic drug:  Insulin Degludec Inject 14 Units into the skin every evening.   VENTOLIN HFA 108 (90 Base) MCG/ACT inhaler Generic drug:  albuterol Inhale 1-2 puffs into the lungs every 6 (six) hours as needed for shortness of breath. wheezing   Vitamin D (Ergocalciferol) 1.25 MG (50000 UT) Caps capsule Commonly known as:  DRISDOL Take 50,000 Units by mouth every Tuesday.   zolpidem 10 MG tablet Commonly known as:  AMBIEN Take 10 mg by mouth at bedtime.  Follow-up Drew. Schedule an appointment as soon as possible for a visit in 3 day(s).   Contact information: [place] [id number] [phone]           Major procedures and Radiology Reports - PLEASE review detailed and final reports thoroughly  -       Dg Chest 2 View  Result Date: 02/27/2018 CLINICAL DATA:  Bilateral lower extremity edema and  lightheadedness. EXAM: CHEST - 2 VIEW COMPARISON:  February 25, 2018 FINDINGS: The heart size is enlarged. The aorta is tortuous. The mediastinal contour is stable. There is pulmonary edema. There is no focal pneumonia or pleural effusion. The bony structures are stable. IMPRESSION: Mild congestive heart failure.  No focal pneumonia. Electronically Signed   By: Abelardo Diesel M.D.   On: 02/27/2018 15:37    Micro Results     No results found for this or any previous visit (from the past 240 hour(s)).  Today   Subjective    Jodi Bell today has no headache,no chest abdominal pain,no new weakness tingling or numbness, feels much better wants to go home today.   Objective   Blood pressure (!) 176/93, pulse 77, temperature 98.2 F (36.8 C), temperature source Oral, resp. rate 16, height 5' (1.524 m), weight 84 kg, SpO2 99 %.   Intake/Output Summary (Last 24 hours) at 03/01/2018 0904 Last data filed at 03/01/2018 0600 Gross per 24 hour  Intake 492 ml  Output 1550 ml  Net -1058 ml    Exam  Awake Alert, Oriented x 3, No new F.N deficits, Normal affect [employer].AT,PERRAL Supple Neck,No JVD, No cervical lymphadenopathy appriciated.  Symmetrical Chest wall movement, Good air movement bilaterally, CTAB RRR,No Gallops,Rubs or new Murmurs, No Parasternal Heave +ve B.Sounds, Abd Soft, Non tender, No organomegaly appriciated, No rebound -guarding or rigidity. No Cyanosis, Clubbing or edema, No new Rash or bruise    Data Review   CBC w Diff:  Lab Results  Component Value Date   WBC 6.3 02/28/2018   HGB 7.4 (L) 02/28/2018   HCT 24.1 (L) 02/28/2018   PLT 242 02/28/2018   LYMPHOPCT 23 02/27/2018   MONOPCT 6 02/27/2018   EOSPCT 5 02/27/2018   BASOPCT 0 02/27/2018    CMP:  Lab Results  Component Value Date   NA 134 (L) 03/01/2018   K 3.8 03/01/2018   CL 103 03/01/2018   CO2 19 (L) 03/01/2018   BUN 63 (H) 03/01/2018   CREATININE 8.47 (H) 03/01/2018   CREATININE 1.34 (H) 09/18/2011     PROT 5.2 (L) 09/21/2017   ALBUMIN 2.0 (L) 12/14/2017   BILITOT 0.4 09/21/2017   ALKPHOS 50 09/21/2017   AST 10 (L) 09/21/2017   ALT 9 09/21/2017  .   Total Time in preparing paper work, data evaluation and todays exam - 74 minutes  Lala Lund M.D on 03/01/2018 at 9:04 AM  [facility]   Office  [phone]

## 2018-03-01 NOTE — Progress Notes (Signed)
Discharge instructions (including medications) discussed with and copy provided to patient/caregiver 

## 2018-03-01 NOTE — Plan of Care (Signed)
  Problem: Clinical Measurements: Goal: Ability to maintain clinical measurements within normal limits will improve Outcome: Progressing   Problem: Clinical Measurements: Goal: Respiratory complications will improve Outcome: Progressing   Problem: Clinical Measurements: Goal: Cardiovascular complication will be avoided Outcome: Progressing   

## 2018-03-01 NOTE — Progress Notes (Signed)
[name] to be D/C' per MD order.  Discussed with the patient and all questions fully answered.  VSS, Skin clean, dry and intact without evidence of skin break down, no evidence of skin tears noted. IV catheter discontinued intact. Site without signs and symptoms of complications. Dressing and pressure applied.  An After Visit Summary was printed and given to the patient. Patient received prescription.  D/c education completed with patient/family including follow up instructions, medication list, d/c activities limitations if indicated, with other d/c instructions as indicated by MD - patient able to verbalize understanding, all questions fully answered.   Patient instructed to return to ED, call 911, or call MD for any changes in condition.   Patient escorted via [place], and D/C home via private auto.   [place] 03/01/2018 12:33 PM

## 2018-03-22 ENCOUNTER — Other Ambulatory Visit: Payer: Self-pay | Admitting: Cardiology

## 2018-03-24 ENCOUNTER — Ambulatory Visit: Payer: Self-pay | Admitting: Cardiology

## 2018-04-21 ENCOUNTER — Emergency Department (HOSPITAL_COMMUNITY): Payer: Medicare PPO

## 2018-04-21 ENCOUNTER — Other Ambulatory Visit: Payer: Self-pay

## 2018-04-21 ENCOUNTER — Encounter (HOSPITAL_COMMUNITY): Payer: Self-pay | Admitting: Emergency Medicine

## 2018-04-21 ENCOUNTER — Emergency Department (HOSPITAL_COMMUNITY)
Admission: EM | Admit: 2018-04-21 | Discharge: 2018-04-21 | Disposition: A | Discharge status: Home / Self Care | Payer: Medicare PPO | Attending: Emergency Medicine | Admitting: Emergency Medicine

## 2018-04-21 DIAGNOSIS — J069 Acute upper respiratory infection, unspecified: Secondary | ICD-10-CM | POA: Diagnosis not present

## 2018-04-21 DIAGNOSIS — J449 Chronic obstructive pulmonary disease, unspecified: Secondary | ICD-10-CM | POA: Insufficient documentation

## 2018-04-21 DIAGNOSIS — F17210 Nicotine dependence, cigarettes, uncomplicated: Secondary | ICD-10-CM | POA: Insufficient documentation

## 2018-04-21 DIAGNOSIS — Z992 Dependence on renal dialysis: Secondary | ICD-10-CM | POA: Diagnosis not present

## 2018-04-21 DIAGNOSIS — N184 Chronic kidney disease, stage 4 (severe): Secondary | ICD-10-CM | POA: Diagnosis not present

## 2018-04-21 DIAGNOSIS — R110 Nausea: Secondary | ICD-10-CM | POA: Insufficient documentation

## 2018-04-21 DIAGNOSIS — E1122 Type 2 diabetes mellitus with diabetic chronic kidney disease: Secondary | ICD-10-CM | POA: Diagnosis not present

## 2018-04-21 DIAGNOSIS — Z794 Long term (current) use of insulin: Secondary | ICD-10-CM | POA: Diagnosis not present

## 2018-04-21 DIAGNOSIS — Z79899 Other long term (current) drug therapy: Secondary | ICD-10-CM | POA: Insufficient documentation

## 2018-04-21 DIAGNOSIS — R05 Cough: Secondary | ICD-10-CM | POA: Diagnosis present

## 2018-04-21 DIAGNOSIS — N186 End stage renal disease: Secondary | ICD-10-CM

## 2018-04-21 DIAGNOSIS — B9789 Other viral agents as the cause of diseases classified elsewhere: Secondary | ICD-10-CM

## 2018-04-21 LAB — COMPREHENSIVE METABOLIC PANEL
ALT: 10 U/L (ref 0–44)
AST: 17 U/L (ref 15–41)
Albumin: 2.6 g/dL — ABNORMAL LOW (ref 3.5–5.0)
Alkaline Phosphatase: 93 U/L (ref 38–126)
Anion gap: 14 (ref 5–15)
BUN: 45 mg/dL — ABNORMAL HIGH (ref 6–20)
CO2: 22 mmol/L (ref 22–32)
Calcium: 8.3 mg/dL — ABNORMAL LOW (ref 8.9–10.3)
Chloride: 95 mmol/L — ABNORMAL LOW (ref 98–111)
Creatinine, Ser: 10.31 mg/dL — ABNORMAL HIGH (ref 0.44–1.00)
GFR calc Af Amer: 4 mL/min — ABNORMAL LOW (ref >60)
GFR calc non Af Amer: 4 mL/min — ABNORMAL LOW (ref >60)
Glucose, Bld: 86 mg/dL (ref 70–99)
Potassium: 3.8 mmol/L (ref 3.5–5.1)
Sodium: 131 mmol/L — ABNORMAL LOW (ref 135–145)
Total Bilirubin: 0.5 mg/dL (ref 0.3–1.2)
Total Protein: 6.3 g/dL — ABNORMAL LOW (ref 6.5–8.1)

## 2018-04-21 LAB — CBC WITH DIFFERENTIAL/PLATELET
Abs Immature Granulocytes: 0.02 10*3/uL (ref 0.00–0.07)
Basophils Absolute: 0 10*3/uL (ref 0.0–0.1)
Basophils Relative: 0 %
Eosinophils Absolute: 0.3 10*3/uL (ref 0.0–0.5)
Eosinophils Relative: 5 %
HCT: 28.4 % — ABNORMAL LOW (ref 36.0–46.0)
Hemoglobin: 9 g/dL — ABNORMAL LOW (ref 12.0–15.0)
Immature Granulocytes: 0 %
Lymphocytes Relative: 25 %
Lymphs Abs: 1.8 10*3/uL (ref 0.7–4.0)
MCH: 29.8 pg (ref 26.0–34.0)
MCHC: 31.7 g/dL (ref 30.0–36.0)
MCV: 94 fL (ref 80.0–100.0)
Monocytes Absolute: 0.5 10*3/uL (ref 0.1–1.0)
Monocytes Relative: 7 %
Neutro Abs: 4.4 10*3/uL (ref 1.7–7.7)
Neutrophils Relative %: 63 %
Platelets: 252 10*3/uL (ref 150–400)
RBC: 3.02 MIL/uL — ABNORMAL LOW (ref 3.87–5.11)
RDW: 14.5 % (ref 11.5–15.5)
WBC: 7 10*3/uL (ref 4.0–10.5)
nRBC: 0 % (ref 0.0–0.2)

## 2018-04-21 MED ORDER — OXYCODONE HCL 5 MG PO TABS
5.0000 mg | ORAL_TABLET | Freq: Once | ORAL | Status: AC
  Administered 2018-04-21: 5 mg via ORAL
  Filled 2018-04-21: qty 1

## 2018-04-21 MED ORDER — IPRATROPIUM-ALBUTEROL 0.5-2.5 (3) MG/3ML IN SOLN
3.0000 mL | Freq: Once | RESPIRATORY_TRACT | Status: AC
  Administered 2018-04-21: 3 mL via RESPIRATORY_TRACT
  Filled 2018-04-21: qty 3

## 2018-04-21 NOTE — ED Notes (Signed)
Came to get labs PT NOT IN THE ROOM.

## 2018-04-21 NOTE — ED Provider Notes (Signed)
[facility] EMERGENCY DEPARTMENT Provider Note   CSN: [id number] Arrival date & time: 04/21/18  1111    History   Chief Complaint Chief Complaint  Patient presents with  . Sore Throat  . Missed Dialysis    HPI Jodi Bell is a 58 y.o. female with past medical history of hypertension, asthma, COPD, CKD on dialysis MWF, type 2 diabetes, presenting to the emergency department with complaint of URI symptoms.  Patient states she had a sore throat and runny nose that began on Thursday.  On Friday she developed a dry cough.  She states yesterday she then had some episodes of nausea with vomiting, that is worse after meals.  She has not had any nausea or vomiting today.  Dates because she was not feeling well, she missed her dialysis on Monday and again today.  Denies shortness of breath is worsening from baseline, no new lower extremity swelling, abdominal pain, diarrhea, fever or chills.  Patient is treated her symptoms with over-the-counter cold medications with some relief.  Patient has had the flu vaccine this season.     The history is provided by the patient.    Past Medical History:  Diagnosis Date  . Anemia   . Arthritis    oa and psoriatic ra  . Asthma   . Back pain, chronic    lower back  . Candida infection finished tx 2 days ago  . Chronic insomnia   . Chronic kidney disease    stage 3 ckd last ov nephrology 04-29-17 epic  . Chronic kidney disease (CKD) stage G4/A1, severely decreased glomerular filtration rate (GFR) between 15-29 mL/min/1.73 square meter and albuminuria creatinine ratio less than 30 mg/g (HCC)   . COPD (chronic obstructive pulmonary disease) ([place])   . Diabetes mellitus    type 2  . Eczema   . Essential hypertension   . GERD (gastroesophageal reflux disease)   . History of hiatal hernia   . Obesity   . Pneumonia few yrs ago x 2    Patient Active Problem List   Diagnosis Date Noted  . Fluid overload 02/27/2018  . Chest  pain, rule out acute myocardial infarction 12/12/2017  . Status post left partial knee replacement 11/12/2017  . Hematochezia 09/20/2017  . GERD (gastroesophageal reflux disease) 09/20/2017  . 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 2 diabetes mellitus ([place]) 09/20/2017  . COPD (chronic obstructive pulmonary disease) ([place]) 09/20/2017  . Tobacco use 09/20/2017  . Symptomatic anemia 09/20/2017  . Headache 09/20/2017  . Chronic kidney disease (CKD) stage G4/A1, severely decreased glomerular filtration rate (GFR) between 15-29 mL/min/1.73 square meter and albuminuria creatinine ratio less than 30 mg/g (HCC)   . Serotonin syndrome 09/28/2013  . Respiratory failure ([place]) 09/28/2013  . Encephalopathy acute 09/28/2013  . Hypotension, unspecified 09/28/2013  . 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 Type 2 diabetes mellitus ([place]) 03/06/2008  . INSOMNIA, CHRONIC 03/06/2008  . Essential hypertension 03/06/2008  . ASTHMA 03/06/2008  . ECZEMA 03/06/2008  . BACK PAIN, LUMBAR, CHRONIC 03/06/2008    Past Surgical History:  Procedure Laterality Date  . BACK SURGERY  2016   lower back fusion with cage  . BIOPSY  09/23/2017   Procedure: BIOPSY;  Surgeon: Wonda Horner, MD;  Location: [facility];  Service: Endoscopy;;  . CESAREAN SECTION  1990   x 1   . COLONOSCOPY WITH PROPOFOL N/A 09/23/2017   Procedure: COLONOSCOPY WITH PROPOFOL Hemostatic clips placed;  Surgeon: Wonda Horner, MD;  Location: [facility];  Service: Endoscopy;  Laterality: N/A;  . DILATION AND CURETTAGE OF UTERUS  1988  . ESOPHAGOGASTRODUODENOSCOPY (EGD) WITH PROPOFOL N/A 09/23/2017   Procedure: ESOPHAGOGASTRODUODENOSCOPY (EGD) WITH PROPOFOL;  Surgeon: Wonda Horner, MD;  Location: [facility];  Service: Endoscopy;  Laterality: N/A;  . PARTIAL KNEE ARTHROPLASTY Left 11/12/2017   Procedure: left unicompartmental arthroplasty-medial;  Surgeon: Paralee Cancel, MD;  Location: [facility];  Service: Orthopedics;  Laterality: Left;  64min  .  SUBMUCOSAL INJECTION  09/23/2017   Procedure: SUBMUCOSAL INJECTION;  Surgeon: Wonda Horner, MD;  Location: [facility];  Service: Endoscopy;;  in colon  . TUBAL LIGATION  1990     OB History   No obstetric history on file.      Home Medications    Prior to Admission medications   Medication Sig Start Date End Date Taking? Authorizing Provider  allopurinol (ZYLOPRIM) 300 MG tablet Take 150 mg by mouth daily.     [provider]  amLODipine (NORVASC) 10 MG tablet Take 10 mg by mouth daily.    [provider]  benzonatate (TESSALON PERLES) 100 MG capsule Take 1 capsule (100 mg total) by mouth 3 (three) times daily as needed for cough. 03/01/18 03/01/19  Thurnell Lose, MD  bisoprolol (ZEBETA) 10 MG tablet Take 2 tablets (20 mg total) by mouth daily. 12/14/17   Bonnell Public, MD  cetirizine (ZYRTEC) 10 MG tablet Take 20 mg by mouth 2 (two) times daily.  06/13/17   Shelda Pal, DO  cloNIDine (CATAPRES) 0.2 MG tablet Take 0.2 mg by mouth 2 (two) times daily.  07/15/15   [provider]  EPINEPHrine (EPIPEN) 0.3 mg/0.3 mL SOAJ injection Inject 0.3 mLs (0.3 mg total) into the muscle once. Patient taking differently: Inject 0.3 mg into the muscle as needed (for allergic reaction).  09/30/12   Le, Thao P, DO  esomeprazole (NEXIUM) 20 MG capsule Take 20 mg by mouth every evening.     [provider]  ezetimibe (ZETIA) 10 MG tablet TAKE 1 TABLET BY MOUTH EVERY DAY 03/22/18   Adrian Prows, MD  ferrous sulfate 325 (65 FE) MG tablet Take 325 mg by mouth daily.     [provider]  fluticasone (FLONASE) 50 MCG/ACT nasal spray Place 1 spray into both nostrils daily as needed for allergies or rhinitis.     [provider]  folic acid (FOLVITE) 932 MCG tablet Take 400 mcg by mouth daily.     [provider]  furosemide (LASIX) 40 MG tablet Take 40 mg by mouth 2 (two) times daily.     [provider]  furosemide (LASIX) 40  MG tablet Take 2 tablets (80 mg total) by mouth 2 (two) times daily. 03/01/18   Thurnell Lose, MD  gabapentin (NEURONTIN) 300 MG capsule Take 1 capsule (300 mg total) by mouth at bedtime as needed (Neuropathic pain). 12/14/17 12/14/18  Dana Allan I, MD  hydrALAZINE (APRESOLINE) 50 MG tablet Take 1 tablet (50 mg total) by mouth every 8 (eight) hours. 12/14/17   Bonnell Public, MD  Insulin Degludec (TRESIBA FLEXTOUCH) 200 UNIT/ML SOPN Inject 14 Units into the skin every evening.     [provider]  isosorbide mononitrate (IMDUR) 60 MG 24 hr tablet Take 1 tablet (60 mg total) by mouth daily. 03/02/18   Thurnell Lose, MD  JANUVIA 100 MG tablet Take 100 mg by mouth daily.  08/09/15   [provider]  montelukast (  SINGULAIR) 10 MG tablet Take 10 mg by mouth at bedtime.    [provider]  Multiple Vitamin (MULTIVITAMIN) tablet Take 1 tablet by mouth daily.    [provider]  oxyCODONE 10 MG TABS Take 1-2 tablets (10-20 mg total) by mouth every 6 (six) hours as needed for severe pain (pain score 7-10). Patient taking differently: Take 10 mg by mouth every 6 (six) hours as needed for severe pain (pain score 7-10).  11/13/17   Constable, Amber, PA-C  potassium chloride (K-DUR) 10 MEQ tablet Take 1 tablet (10 mEq total) by mouth daily. 03/01/18   Thurnell Lose, MD  predniSONE (DELTASONE) 2.5 MG tablet Take 2.5 mg by mouth daily with breakfast.    [provider]  sodium bicarbonate 650 MG tablet Take 650 mg by mouth 2 (two) times daily. 02/17/18   [provider]  VENTOLIN HFA 108 (90 Base) MCG/ACT inhaler Inhale 1-2 puffs into the lungs every 6 (six) hours as needed for shortness of breath. wheezing 06/13/15   [provider]  Vitamin D, Ergocalciferol, (DRISDOL) 50000 units CAPS capsule Take 50,000 Units by mouth every Tuesday.  09/15/17   [provider]  zolpidem (AMBIEN) 10 MG tablet Take 10 mg by mouth at bedtime.      [provider]    Family History Family History  Problem Relation Age of Onset  . Lung cancer Mother   . Diabetes Sister   . Heart disease Sister   . Thyroid cancer Other        1/2 sister  . Heart disease Other        1/2 sister    Social History Social History   Tobacco Use  . Smoking status: Current Every Day Smoker    Packs/day: 0.50    Years: 40.00    Pack years: 20.00    Types: Cigarettes    Last attempt to quit: 02/03/2014    Years since quitting: 4.2  . Smokeless tobacco: Never Used  . Tobacco comment: Pt quit  a week ago, per 11-06-17 PAT appt  Substance Use Topics  . Alcohol use: Yes    Comment: occ  . Drug use: No     Allergies   Adalimumab; Allyl isothiocyanate; Banana; Ezetimibe; Ibuprofen; Leflunomide and related; Lexapro [escitalopram oxalate]; Lisinopril; Other; Pistachio nut (diagnostic); Statins; and Trazodone and nefazodone   Review of Systems Review of Systems  Constitutional: Positive for appetite change. Negative for chills and fever.  HENT: Positive for congestion, rhinorrhea and sore throat. Negative for ear pain, trouble swallowing and voice change.   Respiratory: Positive for cough. Negative for shortness of breath.   Cardiovascular: Negative for leg swelling.  Gastrointestinal: Positive for nausea and vomiting. Negative for abdominal pain.  All other systems reviewed and are negative.    Physical Exam Updated Vital Signs BP (!) 148/97   Pulse 74   Temp 98.7 F (37.1 C) (Oral)   Resp 19   Ht 5\' 2"  (1.575 m)   Wt 78.9 kg   SpO2 100%   BMI 31.83 kg/m   Physical Exam Vitals signs and nursing note reviewed.  Constitutional:      General: She is not in acute distress.    Appearance: She is well-developed. She is not ill-appearing.  HENT:     Head: Normocephalic and atraumatic.     Right Ear: Tympanic membrane and ear canal normal.     Left Ear: Tympanic membrane and ear canal normal.  Mouth/Throat:     Mouth:  Mucous membranes are moist.     Tonsils: No tonsillar exudate or tonsillar abscesses.  Eyes:     Conjunctiva/sclera: Conjunctivae normal.     Pupils: Pupils are equal, round, and reactive to light.  Neck:     Musculoskeletal: Normal range of motion and neck supple.  Cardiovascular:     Rate and Rhythm: Normal rate and regular rhythm.  Pulmonary:     Effort: Pulmonary effort is normal. No respiratory distress.     Breath sounds: Wheezing present.     Comments: Breath sounds diminished with faint wheezes bilaterally. Chest:     Chest wall: No tenderness.  Abdominal:     General: Bowel sounds are normal. There is no distension.     Palpations: Abdomen is soft.     Tenderness: There is no abdominal tenderness. There is no guarding or rebound.  Musculoskeletal:     Comments: 1+ pedal edema bilaterally  Lymphadenopathy:     Cervical: No cervical adenopathy.  Skin:    General: Skin is warm.  Neurological:     Mental Status: She is alert.  Psychiatric:        Mood and Affect: Mood normal.        Behavior: Behavior normal.      ED Treatments / Results  Labs (all labs ordered are listed, but only abnormal results are displayed) Labs Reviewed  COMPREHENSIVE METABOLIC PANEL - Abnormal; Notable for the following components:      Result Value   Sodium 131 (*)    Chloride 95 (*)    BUN 45 (*)    Creatinine, Ser 10.31 (*)    Calcium 8.3 (*)    Total Protein 6.3 (*)    Albumin 2.6 (*)    GFR calc non Af Amer 4 (*)    GFR calc Af Amer 4 (*)    All other components within normal limits  CBC WITH DIFFERENTIAL/PLATELET - Abnormal; Notable for the following components:   RBC 3.02 (*)    Hemoglobin 9.0 (*)    HCT 28.4 (*)    All other components within normal limits    EKG EKG Interpretation  Date/Time:  Wednesday April 21 2018 12:47:29 EDT Ventricular Rate:  78 PR Interval:    QRS Duration: 89 QT Interval:  436 QTC Calculation: 497 R Axis:   -27 Text Interpretation:   Sinus rhythm Borderline left axis deviation Borderline prolonged QT interval No acute changes No significant change since last tracing 7 Confirmed by Varney Biles ([id number]) on 04/21/2018 12:52:22 PM   Radiology Dg Chest 2 View  Result Date: 04/21/2018 CLINICAL DATA:  Cough, congestion and sore throat for 4-5 days. EXAM: CHEST - 2 VIEW COMPARISON:  02/27/2010 FINDINGS: The heart is enlarged but stable. There is mild tortuosity and calcification of the thoracic aorta. Moderate central vascular congestion without overt pulmonary edema. No definite infiltrates or effusions. The bony thorax is intact. IMPRESSION: Cardiac enlargement and moderate vascular congestion without overt pulmonary edema, focal infiltrates or pleural effusions. Electronically Signed   By: Marijo Sanes M.D.   On: 04/21/2018 12:54    Procedures Procedures (including critical care time)  Medications Ordered in ED Medications  ipratropium-albuterol (DUONEB) 0.5-2.5 (3) MG/3ML nebulizer solution 3 mL (3 mLs Nebulization Given 04/21/18 1253)  oxyCODONE (Oxy IR/ROXICODONE) immediate release tablet 5 mg (5 mg Oral Given 04/21/18 1505)     Initial Impression / Assessment and Plan / ED Course  I have reviewed the  triage vital signs and the nursing notes.  Pertinent labs & imaging results that were available during my care of the patient were reviewed by me and considered in my medical decision making (see chart for details).        Patient with history of CKD on hemodialysis MWF, asthma, COPD, presenting with 5 days of URI symptoms, consistent with viral etiology.  On exam, lungs with faint wheezes, however patient denies any shortness of breath.  O2 saturation 100% on room air.  Normal work of breathing.  There is some lower extremity edema present, however patient states this is not worse than her normal.  She has missed dialysis on Monday and today due to not feeling well.  ENT exam is otherwise reassuring.  Uvula is midline,  tolerating secretions.  Afebrile.  Chest x-ray with vascular congestion, however patient is breathing at her baseline and improved after DuoNeb.  Creatinine is 10.3 however potassium is within normal limits.  No leukocytosis.  Recommend patient follow-up with her hemodialysis clinic for dialysis tomorrow.  Symptomatic management for viral illness.  Patient has albuterol inhaler at home, encouraged she use this as the DuoNeb helped her chest congestion today.  Patient is agreeable to plan and safe for discharge.  Patient discussed with Dr. Kathrynn Humble.  Discussed results, findings, treatment and follow up. Patient advised of return precautions. Patient verbalized understanding and agreed with plan.   Final Clinical Impressions(s) / ED Diagnoses   Final diagnoses:  CKD (chronic kidney disease) requiring chronic dialysis ([place])  Viral URI with cough    ED Discharge Orders    None       Robinson, [place] N, PA-C 04/21/18 1506    Varney Biles, MD 04/23/18 1817

## 2018-04-21 NOTE — Discharge Instructions (Addendum)
Call your dialysis clinic to schedule dialysis tomorrow. Use your inhaler every 4-6 hours as needed. Continue taking over-the-counter medicines as needed for cold symptoms. You can use saline nasal spray for congestion. Return for develop shortness of breath, difficulty swallowing, or new or worsening symptoms.

## 2018-04-21 NOTE — ED Notes (Signed)
Patient verbalizes understanding of discharge instructions. Opportunity for questioning and answers were provided. Armband removed by staff, pt discharged from ED.  

## 2018-04-21 NOTE — ED Triage Notes (Signed)
Pt arrives to ED from home with complaints of cough, runny nose, sore throat, since Thursday. Pt stated she started to vomit yesterday. Pt has not been to dialysis since last Monday.

## 2018-05-16 ENCOUNTER — Inpatient Hospital Stay (HOSPITAL_COMMUNITY)
Admission: EM | Admit: 2018-05-16 | Discharge: 2018-05-20 | DRG: 208 | Disposition: A | Discharge status: Home / Self Care | Payer: Medicare PPO | Attending: Internal Medicine | Admitting: Internal Medicine

## 2018-05-16 ENCOUNTER — Emergency Department (HOSPITAL_COMMUNITY): Payer: Medicare PPO

## 2018-05-16 ENCOUNTER — Other Ambulatory Visit: Payer: Self-pay

## 2018-05-16 ENCOUNTER — Encounter (HOSPITAL_COMMUNITY): Payer: Self-pay

## 2018-05-16 DIAGNOSIS — N186 End stage renal disease: Secondary | ICD-10-CM | POA: Diagnosis not present

## 2018-05-16 DIAGNOSIS — E1122 Type 2 diabetes mellitus with diabetic chronic kidney disease: Secondary | ICD-10-CM | POA: Diagnosis present

## 2018-05-16 DIAGNOSIS — J9601 Acute respiratory failure with hypoxia: Secondary | ICD-10-CM | POA: Diagnosis present

## 2018-05-16 DIAGNOSIS — R6889 Other general symptoms and signs: Secondary | ICD-10-CM | POA: Diagnosis not present

## 2018-05-16 DIAGNOSIS — T41295A Adverse effect of other general anesthetics, initial encounter: Secondary | ICD-10-CM | POA: Diagnosis not present

## 2018-05-16 DIAGNOSIS — Z6831 Body mass index (BMI) 31.0-31.9, adult: Secondary | ICD-10-CM

## 2018-05-16 DIAGNOSIS — L309 Dermatitis, unspecified: Secondary | ICD-10-CM | POA: Diagnosis present

## 2018-05-16 DIAGNOSIS — Z716 Tobacco abuse counseling: Secondary | ICD-10-CM

## 2018-05-16 DIAGNOSIS — I959 Hypotension, unspecified: Secondary | ICD-10-CM | POA: Diagnosis not present

## 2018-05-16 DIAGNOSIS — Z7951 Long term (current) use of inhaled steroids: Secondary | ICD-10-CM

## 2018-05-16 DIAGNOSIS — M109 Gout, unspecified: Secondary | ICD-10-CM | POA: Diagnosis present

## 2018-05-16 DIAGNOSIS — J441 Chronic obstructive pulmonary disease with (acute) exacerbation: Secondary | ICD-10-CM | POA: Diagnosis present

## 2018-05-16 DIAGNOSIS — E669 Obesity, unspecified: Secondary | ICD-10-CM | POA: Diagnosis present

## 2018-05-16 DIAGNOSIS — E785 Hyperlipidemia, unspecified: Secondary | ICD-10-CM | POA: Diagnosis present

## 2018-05-16 DIAGNOSIS — J9602 Acute respiratory failure with hypercapnia: Secondary | ICD-10-CM | POA: Diagnosis present

## 2018-05-16 DIAGNOSIS — Z79899 Other long term (current) drug therapy: Secondary | ICD-10-CM | POA: Diagnosis not present

## 2018-05-16 DIAGNOSIS — Z992 Dependence on renal dialysis: Secondary | ICD-10-CM

## 2018-05-16 DIAGNOSIS — R069 Unspecified abnormalities of breathing: Secondary | ICD-10-CM

## 2018-05-16 DIAGNOSIS — Z7401 Bed confinement status: Secondary | ICD-10-CM | POA: Diagnosis not present

## 2018-05-16 DIAGNOSIS — R251 Tremor, unspecified: Secondary | ICD-10-CM | POA: Diagnosis present

## 2018-05-16 DIAGNOSIS — I120 Hypertensive chronic kidney disease with stage 5 chronic kidney disease or end stage renal disease: Secondary | ICD-10-CM | POA: Diagnosis present

## 2018-05-16 DIAGNOSIS — J45901 Unspecified asthma with (acute) exacerbation: Secondary | ICD-10-CM | POA: Diagnosis present

## 2018-05-16 DIAGNOSIS — Z794 Long term (current) use of insulin: Secondary | ICD-10-CM

## 2018-05-16 DIAGNOSIS — Z720 Tobacco use: Secondary | ICD-10-CM

## 2018-05-16 DIAGNOSIS — Z20828 Contact with and (suspected) exposure to other viral communicable diseases: Secondary | ICD-10-CM | POA: Diagnosis present

## 2018-05-16 DIAGNOSIS — J9690 Respiratory failure, unspecified, unspecified whether with hypoxia or hypercapnia: Secondary | ICD-10-CM

## 2018-05-16 DIAGNOSIS — Z9911 Dependence on respirator [ventilator] status: Secondary | ICD-10-CM | POA: Diagnosis not present

## 2018-05-16 DIAGNOSIS — J45909 Unspecified asthma, uncomplicated: Secondary | ICD-10-CM | POA: Diagnosis not present

## 2018-05-16 DIAGNOSIS — E8770 Fluid overload, unspecified: Secondary | ICD-10-CM | POA: Diagnosis not present

## 2018-05-16 DIAGNOSIS — Z20822 Contact with and (suspected) exposure to covid-19: Secondary | ICD-10-CM

## 2018-05-16 LAB — C-REACTIVE PROTEIN: CRP: 4.8 mg/dL — ABNORMAL HIGH (ref <1.0)

## 2018-05-16 LAB — POCT I-STAT 7, (LYTES, BLD GAS, ICA,H+H)
Acid-Base Excess: 1 mmol/L (ref 0.0–2.0)
Bicarbonate: 26.5 mmol/L (ref 20.0–28.0)
Bicarbonate: 24.6 mmol/L (ref 20.0–28.0)
Calcium, Ion: 1.08 mmol/L — ABNORMAL LOW (ref 1.15–1.40)
Calcium, Ion: 1.06 mmol/L — ABNORMAL LOW (ref 1.15–1.40)
HCT: 31 % — ABNORMAL LOW (ref 36.0–46.0)
HCT: 26 % — ABNORMAL LOW (ref 36.0–46.0)
Hemoglobin: 10.5 g/dL — ABNORMAL LOW (ref 12.0–15.0)
Hemoglobin: 8.8 g/dL — ABNORMAL LOW (ref 12.0–15.0)
O2 Saturation: 95 %
O2 Saturation: 97 %
Patient temperature: 98.6
Patient temperature: 96.8
Potassium: 3.2 mmol/L — ABNORMAL LOW (ref 3.5–5.1)
Potassium: 3.4 mmol/L — ABNORMAL LOW (ref 3.5–5.1)
Sodium: 132 mmol/L — ABNORMAL LOW (ref 135–145)
Sodium: 131 mmol/L — ABNORMAL LOW (ref 135–145)
TCO2: 28 mmol/L (ref 22–32)
TCO2: 26 mmol/L (ref 22–32)
pCO2 arterial: 44.1 mmHg (ref 32.0–48.0)
pCO2 arterial: 38.9 mmHg (ref 32.0–48.0)
pH, Arterial: 7.387 (ref 7.350–7.450)
pH, Arterial: 7.405 (ref 7.350–7.450)
pO2, Arterial: 75 mmHg — ABNORMAL LOW (ref 83.0–108.0)
pO2, Arterial: 87 mmHg (ref 83.0–108.0)

## 2018-05-16 LAB — CBC WITH DIFFERENTIAL/PLATELET
Abs Immature Granulocytes: 0.03 10*3/uL (ref 0.00–0.07)
Basophils Absolute: 0.1 10*3/uL (ref 0.0–0.1)
Basophils Relative: 1 %
Eosinophils Absolute: 0.5 10*3/uL (ref 0.0–0.5)
Eosinophils Relative: 5 %
HCT: 34.1 % — ABNORMAL LOW (ref 36.0–46.0)
Hemoglobin: 10.6 g/dL — ABNORMAL LOW (ref 12.0–15.0)
Immature Granulocytes: 0 %
Lymphocytes Relative: 34 %
Lymphs Abs: 3.8 10*3/uL (ref 0.7–4.0)
MCH: 30.3 pg (ref 26.0–34.0)
MCHC: 31.1 g/dL (ref 30.0–36.0)
MCV: 97.4 fL (ref 80.0–100.0)
Monocytes Absolute: 1.1 10*3/uL — ABNORMAL HIGH (ref 0.1–1.0)
Monocytes Relative: 10 %
Neutro Abs: 5.6 10*3/uL (ref 1.7–7.7)
Neutrophils Relative %: 50 %
Platelets: 347 10*3/uL (ref 150–400)
RBC: 3.5 MIL/uL — ABNORMAL LOW (ref 3.87–5.11)
RDW: 16.5 % — ABNORMAL HIGH (ref 11.5–15.5)
WBC: 11.2 10*3/uL — ABNORMAL HIGH (ref 4.0–10.5)
nRBC: 0 % (ref 0.0–0.2)

## 2018-05-16 LAB — COMPREHENSIVE METABOLIC PANEL
ALT: 14 U/L (ref 0–44)
AST: 27 U/L (ref 15–41)
Albumin: 3 g/dL — ABNORMAL LOW (ref 3.5–5.0)
Alkaline Phosphatase: 144 U/L — ABNORMAL HIGH (ref 38–126)
Anion gap: 14 (ref 5–15)
BUN: 19 mg/dL (ref 6–20)
CO2: 24 mmol/L (ref 22–32)
Calcium: 8.5 mg/dL — ABNORMAL LOW (ref 8.9–10.3)
Chloride: 92 mmol/L — ABNORMAL LOW (ref 98–111)
Creatinine, Ser: 5.79 mg/dL — ABNORMAL HIGH (ref 0.44–1.00)
GFR calc Af Amer: 9 mL/min — ABNORMAL LOW (ref >60)
GFR calc non Af Amer: 7 mL/min — ABNORMAL LOW (ref >60)
Glucose, Bld: 142 mg/dL — ABNORMAL HIGH (ref 70–99)
Potassium: 3.4 mmol/L — ABNORMAL LOW (ref 3.5–5.1)
Sodium: 130 mmol/L — ABNORMAL LOW (ref 135–145)
Total Bilirubin: 0.4 mg/dL (ref 0.3–1.2)
Total Protein: 7.4 g/dL (ref 6.5–8.1)

## 2018-05-16 LAB — RAPID URINE DRUG SCREEN, HOSP PERFORMED
Amphetamines: NOT DETECTED
Barbiturates: NOT DETECTED
Benzodiazepines: NOT DETECTED
Cocaine: NOT DETECTED
Opiates: NOT DETECTED
Tetrahydrocannabinol: NOT DETECTED

## 2018-05-16 LAB — BLOOD GAS, ARTERIAL
Acid-base deficit: 3.6 mmol/L — ABNORMAL HIGH (ref 0.0–2.0)
Bicarbonate: 25.9 mmol/L (ref 20.0–28.0)
Drawn by: 36277
FIO2: 100
MECHVT: 520 mL
O2 Saturation: 98.8 %
PEEP: 5 cmH2O
Patient temperature: 98.6
RATE: 18 resp/min
pCO2 arterial: 96.7 mmHg (ref 32.0–48.0)
pH, Arterial: 7.056 — CL (ref 7.350–7.450)
pO2, Arterial: 244 mmHg — ABNORMAL HIGH (ref 83.0–108.0)

## 2018-05-16 LAB — RESPIRATORY PANEL BY PCR

## 2018-05-16 LAB — TRIGLYCERIDES: Triglycerides: 260 mg/dL — ABNORMAL HIGH (ref <150)

## 2018-05-16 LAB — FERRITIN: Ferritin: 848 ng/mL — ABNORMAL HIGH (ref 11–307)

## 2018-05-16 LAB — LACTIC ACID, PLASMA
Lactic Acid, Venous: 1.6 mmol/L (ref 0.5–1.9)
Lactic Acid, Venous: 1.2 mmol/L (ref 0.5–1.9)

## 2018-05-16 LAB — GLUCOSE, CAPILLARY
Glucose-Capillary: 173 mg/dL — ABNORMAL HIGH (ref 70–99)
Glucose-Capillary: 163 mg/dL — ABNORMAL HIGH (ref 70–99)
Glucose-Capillary: 169 mg/dL — ABNORMAL HIGH (ref 70–99)
Glucose-Capillary: 136 mg/dL — ABNORMAL HIGH (ref 70–99)

## 2018-05-16 LAB — TROPONIN I: Troponin I: <0.03 ng/mL (ref <0.03)

## 2018-05-16 LAB — BRAIN NATRIURETIC PEPTIDE: B Natriuretic Peptide: >4500 pg/mL — ABNORMAL HIGH (ref 0.0–100.0)

## 2018-05-16 LAB — STREP PNEUMONIAE URINARY ANTIGEN: Strep Pneumo Urinary Antigen: NEGATIVE

## 2018-05-16 LAB — HIV ANTIBODY (ROUTINE TESTING W REFLEX): HIV Screen 4th Generation wRfx: NONREACTIVE

## 2018-05-16 LAB — MRSA PCR SCREENING: MRSA by PCR: NEGATIVE

## 2018-05-16 LAB — PROCALCITONIN: Procalcitonin: 0.22 ng/mL

## 2018-05-16 LAB — D-DIMER, QUANTITATIVE: D-Dimer, Quant: 6.1 ug/mL-FEU — ABNORMAL HIGH (ref 0.00–0.50)

## 2018-05-16 MED ORDER — ORAL CARE MOUTH RINSE
15.0000 mL | OROMUCOSAL | Status: DC
  Administered 2018-05-16 – 2018-05-17 (×14): 15 mL via OROMUCOSAL

## 2018-05-16 MED ORDER — PIPERACILLIN-TAZOBACTAM 3.375 G IVPB
3.3750 g | Freq: Two times a day (BID) | INTRAVENOUS | Status: DC
  Administered 2018-05-16 – 2018-05-18 (×4): 3.375 g via INTRAVENOUS
  Filled 2018-05-16 (×7): qty 50

## 2018-05-16 MED ORDER — PIPERACILLIN-TAZOBACTAM 3.375 G IVPB 30 MIN: 3.3750 g | Freq: Once | INTRAVENOUS | Status: DC

## 2018-05-16 MED ORDER — ACETAMINOPHEN 325 MG PO TABS
650.0000 mg | ORAL_TABLET | ORAL | Status: DC | PRN
  Administered 2018-05-18 – 2018-05-20 (×3): 650 mg via ORAL
  Filled 2018-05-16 (×3): qty 2

## 2018-05-16 MED ORDER — ENOXAPARIN SODIUM 30 MG/0.3ML ~~LOC~~ SOLN
30.0000 mg | SUBCUTANEOUS | Status: DC
  Administered 2018-05-16: 30 mg via SUBCUTANEOUS
  Filled 2018-05-16 (×2): qty 0.3

## 2018-05-16 MED ORDER — INSULIN ASPART 100 UNIT/ML ~~LOC~~ SOLN
0.0000 [IU] | SUBCUTANEOUS | Status: DC
  Administered 2018-05-16: 3 [IU] via SUBCUTANEOUS
  Administered 2018-05-16: 20:00:00 2 [IU] via SUBCUTANEOUS
  Administered 2018-05-16 – 2018-05-17 (×3): 3 [IU] via SUBCUTANEOUS
  Administered 2018-05-17: 2 [IU] via SUBCUTANEOUS
  Administered 2018-05-18 (×2): 3 [IU] via SUBCUTANEOUS

## 2018-05-16 MED ORDER — SENNOSIDES 8.8 MG/5ML PO SYRP
5.0000 mL | ORAL_SOLUTION | Freq: Two times a day (BID) | ORAL | Status: DC | PRN
  Filled 2018-05-16: qty 5

## 2018-05-16 MED ORDER — ALBUTEROL SULFATE (2.5 MG/3ML) 0.083% IN NEBU: 2.5000 mg | INHALATION_SOLUTION | RESPIRATORY_TRACT | Status: DC | PRN

## 2018-05-16 MED ORDER — PANTOPRAZOLE SODIUM 40 MG IV SOLR: 40.0000 mg | Freq: Every day | INTRAVENOUS | Status: DC

## 2018-05-16 MED ORDER — PROPOFOL 1000 MG/100ML IV EMUL
5.0000 ug/kg/min | INTRAVENOUS | Status: DC
  Administered 2018-05-16: 10 ug/kg/min via INTRAVENOUS

## 2018-05-16 MED ORDER — ETOMIDATE 2 MG/ML IV SOLN
INTRAVENOUS | Status: AC | PRN
  Administered 2018-05-16: 20 mg via INTRAVENOUS

## 2018-05-16 MED ORDER — ALBUTEROL SULFATE (2.5 MG/3ML) 0.083% IN NEBU: 2.5000 mg | INHALATION_SOLUTION | RESPIRATORY_TRACT | Status: DC

## 2018-05-16 MED ORDER — FUROSEMIDE 10 MG/ML IJ SOLN
120.0000 mg | Freq: Once | INTRAVENOUS | Status: AC
  Administered 2018-05-16: 120 mg via INTRAVENOUS
  Filled 2018-05-16: qty 10

## 2018-05-16 MED ORDER — MIDAZOLAM HCL (PF) 5 MG/ML IJ SOLN: 1.0000 mg | INTRAMUSCULAR | Status: DC | PRN

## 2018-05-16 MED ORDER — ALBUTEROL SULFATE (2.5 MG/3ML) 0.083% IN NEBU
INHALATION_SOLUTION | RESPIRATORY_TRACT | Status: AC
  Administered 2018-05-16: 2.5 mg
  Filled 2018-05-16: qty 3

## 2018-05-16 MED ORDER — CHLORHEXIDINE GLUCONATE 0.12% ORAL RINSE (MEDLINE KIT)
15.0000 mL | Freq: Two times a day (BID) | OROMUCOSAL | Status: DC
  Administered 2018-05-16 – 2018-05-17 (×3): 15 mL via OROMUCOSAL

## 2018-05-16 MED ORDER — HYDRALAZINE HCL 20 MG/ML IJ SOLN
10.0000 mg | INTRAMUSCULAR | Status: DC | PRN
  Administered 2018-05-18 (×2): 20 mg via INTRAVENOUS
  Filled 2018-05-16 (×2): qty 1

## 2018-05-16 MED ORDER — PANTOPRAZOLE SODIUM 40 MG PO PACK
40.0000 mg | PACK | Freq: Every day | ORAL | Status: DC
  Administered 2018-05-16 – 2018-05-17 (×2): 40 mg
  Filled 2018-05-16 (×2): qty 20

## 2018-05-16 MED ORDER — BISACODYL 10 MG RE SUPP: 10.0000 mg | Freq: Every day | RECTAL | Status: DC | PRN

## 2018-05-16 MED ORDER — ONDANSETRON HCL 4 MG/2ML IJ SOLN: 4.0000 mg | Freq: Four times a day (QID) | INTRAMUSCULAR | Status: DC | PRN

## 2018-05-16 MED ORDER — HEPARIN SODIUM (PORCINE) 5000 UNIT/ML IJ SOLN
5000.0000 [IU] | Freq: Three times a day (TID) | INTRAMUSCULAR | Status: DC
  Filled 2018-05-16: qty 1

## 2018-05-16 MED ORDER — PROPOFOL 1000 MG/100ML IV EMUL
0.0000 ug/kg/min | INTRAVENOUS | Status: DC
  Filled 2018-05-16: qty 100

## 2018-05-16 MED ORDER — LINEZOLID 600 MG/300ML IV SOLN
600.0000 mg | Freq: Two times a day (BID) | INTRAVENOUS | Status: DC
  Administered 2018-05-16 (×2): 600 mg via INTRAVENOUS
  Filled 2018-05-16 (×4): qty 300

## 2018-05-16 MED ORDER — PROPOFOL 1000 MG/100ML IV EMUL
INTRAVENOUS | Status: AC
  Filled 2018-05-16: qty 100

## 2018-05-16 MED ORDER — MIDAZOLAM HCL 2 MG/2ML IJ SOLN
1.0000 mg | INTRAMUSCULAR | Status: DC | PRN
  Administered 2018-05-16: 2 mg via INTRAVENOUS
  Administered 2018-05-16: 1 mg via INTRAVENOUS
  Administered 2018-05-17 (×2): 2 mg via INTRAVENOUS
  Filled 2018-05-16 (×3): qty 2
  Filled 2018-05-16: qty 4

## 2018-05-16 MED ORDER — METHYLPREDNISOLONE SODIUM SUCC 125 MG IJ SOLR
60.0000 mg | Freq: Every day | INTRAMUSCULAR | Status: DC
  Administered 2018-05-16 – 2018-05-18 (×3): 60 mg via INTRAVENOUS
  Filled 2018-05-16 (×3): qty 2

## 2018-05-16 MED ORDER — SODIUM CHLORIDE 0.9 % IV SOLN
INTRAVENOUS | Status: AC | PRN
  Administered 2018-05-16: 1000 mL via INTRAVENOUS

## 2018-05-16 MED ORDER — ROCURONIUM BROMIDE 50 MG/5ML IV SOLN
INTRAVENOUS | Status: AC | PRN
  Administered 2018-05-16: 100 mg via INTRAVENOUS

## 2018-05-16 MED ORDER — FENTANYL 2500MCG IN NS 250ML (10MCG/ML) PREMIX INFUSION
25.0000 ug/h | INTRAVENOUS | Status: DC
  Administered 2018-05-16: 50 ug/h via INTRAVENOUS
  Administered 2018-05-16: 150 ug/h via INTRAVENOUS
  Administered 2018-05-17: 275 ug/h via INTRAVENOUS
  Filled 2018-05-16 (×3): qty 250

## 2018-05-16 MED ORDER — INSULIN ASPART 100 UNIT/ML ~~LOC~~ SOLN: 0.0000 [IU] | SUBCUTANEOUS | Status: DC

## 2018-05-16 MED ORDER — FENTANYL CITRATE (PF) 100 MCG/2ML IJ SOLN
50.0000 ug | Freq: Once | INTRAMUSCULAR | Status: AC
  Administered 2018-05-16: 50 ug via INTRAVENOUS

## 2018-05-16 MED ORDER — ALBUTEROL SULFATE (2.5 MG/3ML) 0.083% IN NEBU
2.5000 mg | INHALATION_SOLUTION | RESPIRATORY_TRACT | Status: DC
  Administered 2018-05-16 (×4): 2.5 mg via RESPIRATORY_TRACT
  Filled 2018-05-16 (×3): qty 3

## 2018-05-16 MED ORDER — ALBUTEROL SULFATE HFA 108 (90 BASE) MCG/ACT IN AERS
2.0000 | INHALATION_SPRAY | RESPIRATORY_TRACT | Status: DC
  Filled 2018-05-16: qty 6.7

## 2018-05-16 MED ORDER — ALBUTEROL SULFATE HFA 108 (90 BASE) MCG/ACT IN AERS
2.0000 | INHALATION_SPRAY | RESPIRATORY_TRACT | Status: DC | PRN
  Filled 2018-05-16 (×2): qty 6.7

## 2018-05-16 MED ORDER — FENTANYL BOLUS VIA INFUSION
50.0000 ug | INTRAVENOUS | Status: DC | PRN
  Administered 2018-05-16 – 2018-05-17 (×8): 50 ug via INTRAVENOUS
  Filled 2018-05-16: qty 50

## 2018-05-16 NOTE — ED Triage Notes (Addendum)
Per EMS, SOB all day, tried albuterol.  EMS gave duo neb, 99% on non-rebreather.  Given 125 solmed., 190/100.  Pt has diminished breath sounds on both sides, unable to speak to answer questions.

## 2018-05-16 NOTE — H&P (Addendum)
NAME:  Jodi Bell, MRN:  [id number], DOB:  Jan 05, 1961, LOS: 0 ADMISSION DATE:  05/16/2018, CONSULTATION DATE:  05/16/18 REFERRING MD:  Chryl Heck, CHIEF COMPLAINT:  Shortness of breath  Brief History   58 year old woman with history of asthma, ESRD on dialysis MWF with sudden onset shortness of breath since yesterday, 4/4 am.    History of present illness   Dyspnea at rest since yesterday am (4/4).  Lives with daughter.  Apparently was unable to get her nebulizer treatment done on her own, which is unusual for her.  Patient called her to come assist her daughter this evening, found her to be very distressed, short of breath and unable to complete sentences, so EMS was called.  She was given duoneb and solumedrol in route.   She was intubated in the er for severe respiratory distress, tachypnea, confusion. Initial sat "20%", improved to 90 with NRB but remained tachypneic and altered.  Per daughter, no sick contacts.   Last dialysis Friday as scheduled with no problems.    She was hypertensive at the time of intubation, which improved with sedation and intubation.  Started on propofol infusion.  2020 propofol discontinued due to hypotension   Past Medical History  Asthma (has nebulizer at home) ESRD - Nephrologist is Dr. Quincy Simmonds HTN  [facility] Events   Intubated 4/4   Consults:  05/16/2018 renal>>  Procedures:  Intubated 4/4  Significant Diagnostic Tests:   CXR 05/16/18:  Moderate cardiomegaly with hazy bilateral opacities. No pneumothorax or sizable pleural effusion.  IMPRESSION: 1. Radiographically appropriate position of endotracheal tube. 2. Cardiomegaly with bilateral hazy opacities which may indicate pulmonary edema or infection.  Micro Data:  4/4 Blood cultures>> 05/16/2018 sputum culture 05/16/2018 urine Legionella 05/16/2018 urine strep 4 06/12/2018 COVID-19 05/16/2018 RVP  Antimicrobials:   linezolid 4/5>> Zosyn 4/5>>  Interim  history/subjective:    Objective   Blood pressure 123/71, pulse 70, temperature (!) 95.8 F (35.4 C), temperature source Oral, resp. rate (!) 25, height 5\' 2"  (1.575 m), weight 78.9 kg, SpO2 100 %.    Vent Mode: PRVC FiO2 (%):  [40 %-100 %] 60 % Set Rate:  [18 bmp-25 bmp] 25 bmp Vt Set:  [390 mL-520 mL] 390 mL PEEP:  [5 cmH20-8 cmH20] 8 cmH20 Plateau Pressure:  [22 cmH20-30 cmH20] 22 cmH20   Intake/Output Summary (Last 24 hours) at 05/16/2018 0924 Last data filed at 05/16/2018 0900 Gross per 24 hour  Intake 76.04 ml  Output -  Net 76.04 ml      Filed Weights   05/16/18 0402  Weight: 78.9 kg    Examination: General: Awake alert follows commands.  She had a period of hypotension while on propofol this is been discontinued.  Noted to have increasing FiO2 needs currently on 60% FiO2 and PEEP increased to 8 arterial blood gas. HEENT: Endotracheal tube orogastric tube in place Neuro: Follows commands CV: Heart sounds are regular regular rate and rhythm PULM: even/non-labored, lungs bilaterally expiratory wheezes noted IN:OMVE, non-tender, bsx4 active  Extremities: warm/dry, 1+ edema  Skin: Warm and dry   Resolved Hospital Problem list     Assessment & Plan:  Acute hypoxic respiratory failure that required intubation on 05/16/2018 at 0413 hrs. She has a history of asthma and continues to smoke end-stage renal disease with a left AV graft and on arm, hemodialysis Monday Wednesdays and Fridays reportedly attended on Friday Note she has a separate medical record number from 1 be used  Rule out COVID-19  Vent bundle Clinically this does not sound like COVID-19 but has been tested. Isolation protocol for COVID-19 Solu-Medrol for bronchospastic stent Bronchodilators Note increasing FiO2 needs on 05/16/2018 Continue to monitor for FiO2 needs increased dramatically and will consider early prone tx 05/16/2018 chest x-ray bilateral hazy opacities could be infection could be fluid    End-stage renal disease with hemodialysis Monday Wednesdays and Fridays Renal consult We will place dialysis catheter early rather than later due to suspected COVID-19 infection Nephrology consult will be called 05/16/2018, Paged at [phone]  Diabetes mellitus Sliding scale insulin protocol  Hypertension Hold home antihypertensives       Best practice:  Diet:NPO  Pain/Anxiety/Delirium protocol (if indicated): yes VAP protocol (if indicated): yes DVT prophylaxis: heparin GI prophylaxis: protonix Glucose control: ISS Mobility: bed bound Code Status: Full Code  Family Communication: 05/16/2018 we will contact daughter for permit for hemodialysis cath.Daughter is Beatriz Chancellor [phone] Disposition:   Labs   CBC: Recent Labs  Lab 05/16/18 0400 05/16/18 0533  WBC 11.2*  --   NEUTROABS 5.6  --   HGB 10.6* 10.5*  HCT 34.1* 31.0*  MCV 97.4  --   PLT 347  --     Basic Metabolic Panel: Recent Labs  Lab 05/16/18 0401 05/16/18 0533  NA 130* 132*  K 3.4* 3.2*  CL 92*  --   CO2 24  --   GLUCOSE 142*  --   BUN 19  --   CREATININE 5.79*  --   CALCIUM 8.5*  --    GFR: Estimated Creatinine Clearance: 10.3 mL/min (A) (by C-G formula based on SCr of 5.79 mg/dL (H)). Recent Labs  Lab 05/16/18 0400 05/16/18 0740  PROCALCITON 0.22  --   WBC 11.2*  --   LATICACIDVEN 1.6 1.2    Liver Function Tests: Recent Labs  Lab 05/16/18 0401  AST 27  ALT 14  ALKPHOS 144*  BILITOT 0.4  PROT 7.4  ALBUMIN 3.0*   No results for input(s): LIPASE, AMYLASE in the last 168 hours. No results for input(s): AMMONIA in the last 168 hours.  ABG    Component Value Date/Time   PHART 7.387 05/16/2018 0533   PCO2ART 44.1 05/16/2018 0533   PO2ART 75.0 (L) 05/16/2018 0533   HCO3 26.5 05/16/2018 0533   TCO2 28 05/16/2018 0533   ACIDBASEDEF 3.6 (H) 05/16/2018 0411   O2SAT 95.0 05/16/2018 0533     Coagulation Profile: No results for input(s): INR, PROTIME in the last 168 hours.  Cardiac  Enzymes: Recent Labs  Lab 05/16/18 0400  TROPONINI <0.03    HbA1C: No results found for: HGBA1C  CBG: Recent Labs  Lab 05/16/18 0727  GLUCAP 173*      Critical care time: 60      Additional problem: QTC prolongation >500   Attending Note:  Case, chart and films reviewed, assessment and plan discussed with Mr Minor. Agree with assessment and plan. Dr Laurelyn Sickle  MD   [facility] catheter pla  Richardson Landry Minor ACNP Maryanna Shape PCCM Pager [phone] till 1 pm If no answer page [phone] 05/16/2018, 9:26 AM

## 2018-05-16 NOTE — ED Notes (Signed)
ED TO INPATIENT HANDOFF REPORT  ED Nurse Name and Phone #: Kathie Rhodes RN  S Name/Age/Gender Jodi Bell 58 y.o. female Room/Bed: RESUSC/RESUSC  Code Status   Code Status: Full Code  Home/SNF/Other Home Patient oriented to: self, place, time and situation Is this baseline? No   Triage Complete: Triage complete  Chief Complaint shortness of breath   Triage Note Per EMS, SOB all day, tried albuterol.  EMS gave duo neb, 99% on non-rebreather.  Given 125 solmed., 190/100.  Pt has diminished breath sounds on both sides, unable to speak to answer questions.   Allergies Allergies not on file  Level of Care/Admitting Diagnosis ED Disposition    ED Disposition Condition [facility] Area: [place] [[id number]]  Level of Care: ICU [6]  Diagnosis: Suspected Covid-19 Virus Infection [[id number]]  Admitting Physician: Collier Bullock [[id number]]  Attending Physician: Collier Bullock [[id number]]  Estimated length of stay: > 1 week  Certification:: I certify this patient will need inpatient services for at least 2 midnights  Bed request comments: COVID R/O HIGH RISK  PT Class (Do Not Modify): Inpatient [101]  PT Acc Code (Do Not Modify): Private [1]       B Medical/Surgery History No past medical history on file.   A IV Location/Drains/Wounds Patient Lines/Drains/Airways Status   Active Line/Drains/Airways    Name:   Placement date:   Placement time:   Site:   Days:   Peripheral IV 05/16/18 Right Antecubital   05/16/18    0402    Antecubital   less than 1   Peripheral IV 05/16/18 Right Hand   05/16/18    0403    Hand   less than 1   NG/OG Tube Orogastric 16 Fr. Center mouth Xray;Aucultation Documented cm marking at nare/ corner of mouth   05/16/18    [place] mouth   less than 1   Airway 7.5 mm   05/16/18    0345     less than 1          Intake/Output Last 24 hours No intake or output data in the 24 hours ending 05/16/18  0551  Labs/Imaging Results for orders placed or performed during the hospital encounter of 05/16/18 (from the past 48 hour(s))  CBC with Differential     Status: Abnormal   Collection Time: 05/16/18  4:00 AM  Result Value Ref Range   WBC 11.2 (H) 4.0 - 10.5 K/uL   RBC 3.50 (L) 3.87 - 5.11 MIL/uL   Hemoglobin 10.6 (L) 12.0 - 15.0 g/dL   HCT 34.1 (L) 36.0 - 46.0 %   MCV 97.4 80.0 - 100.0 fL   MCH 30.3 26.0 - 34.0 pg   MCHC 31.1 30.0 - 36.0 g/dL   RDW 16.5 (H) 11.5 - 15.5 %   Platelets 347 150 - 400 K/uL   nRBC 0.0 0.0 - 0.2 %   Neutrophils Relative % 50 %   Neutro Abs 5.6 1.7 - 7.7 K/uL   Lymphocytes Relative 34 %   Lymphs Abs 3.8 0.7 - 4.0 K/uL   Monocytes Relative 10 %   Monocytes Absolute 1.1 (H) 0.1 - 1.0 K/uL   Eosinophils Relative 5 %   Eosinophils Absolute 0.5 0.0 - 0.5 K/uL   Basophils Relative 1 %   Basophils Absolute 0.1 0.0 - 0.1 K/uL   Immature Granulocytes 0 %   Abs Immature Granulocytes 0.03 0.00 - 0.07 K/uL  Comment: Performed at [facility], [place] [address] [place], [place] [address]  Troponin I - ONCE - STAT     Status: None   Collection Time: 05/16/18  4:00 AM  Result Value Ref Range   Troponin I <0.03 <0.03 ng/mL    Comment: Performed at [facility] [address] [place], [place] [address]  Lactic acid, plasma     Status: None   Collection Time: 05/16/18  4:00 AM  Result Value Ref Range   Lactic Acid, Venous 1.6 0.5 - 1.9 mmol/L    Comment: Performed at [facility] [address], [place], [place] [address]  Blood culture (routine x 2)     Status: None (Preliminary result)   Collection Time: 05/16/18  4:01 AM  Result Value Ref Range   Specimen Description BLOOD RIGHT ARM    Special Requests      BOTTLES DRAWN AEROBIC AND ANAEROBIC Blood Culture adequate volume Performed at [facility], [place] [address], [place], [place] [address]    Culture PENDING    Report Status PENDING   Comprehensive metabolic panel     Status:  Abnormal   Collection Time: 05/16/18  4:01 AM  Result Value Ref Range   Sodium 130 (L) 135 - 145 mmol/L   Potassium 3.4 (L) 3.5 - 5.1 mmol/L   Chloride 92 (L) 98 - 111 mmol/L   CO2 24 22 - 32 mmol/L   Glucose, Bld 142 (H) 70 - 99 mg/dL   BUN 19 6 - 20 mg/dL   Creatinine, Ser 5.79 (H) 0.44 - 1.00 mg/dL   Calcium 8.5 (L) 8.9 - 10.3 mg/dL   Total Protein 7.4 6.5 - 8.1 g/dL   Albumin 3.0 (L) 3.5 - 5.0 g/dL   AST 27 15 - 41 U/L   ALT 14 0 - 44 U/L   Alkaline Phosphatase 144 (H) 38 - 126 U/L   Total Bilirubin 0.4 0.3 - 1.2 mg/dL   GFR calc non Af Amer 7 (L) >60 mL/min   GFR calc Af Amer 9 (L) >60 mL/min   Anion gap 14 5 - 15    Comment: Performed at [facility], [place] [address], [place], [place] [address]  Blood culture (routine x 2)     Status: None (Preliminary result)   Collection Time: 05/16/18  4:02 AM  Result Value Ref Range   Specimen Description BLOOD RIGHT HAND    Special Requests      BOTTLES DRAWN AEROBIC ONLY Blood Culture adequate volume Performed at [facility] [address], [name], [place] [address]    Culture PENDING    Report Status PENDING   Blood gas, arterial     Status: Abnormal   Collection Time: 05/16/18  4:11 AM  Result Value Ref Range   FIO2 100.00    Delivery systems VENTILATOR    Mode PRESSURE REGULATED VOLUME CONTROL    VT 520 mL   LHR 18.0 resp/min   Peep/cpap 5.0 cm H20   pH, Arterial 7.056 (LL) 7.350 - 7.450    Comment: CRITICAL RESULT CALLED TO, READ BACK BY AND VERIFIED WITH: TERRI COCKMAN, RRT RCP AT 0415 BY FRANK MIKE JR RRT,RCP ON 05/16/2018    pCO2 arterial 96.7 (HH) 32.0 - 48.0 mmHg    Comment: CRITICAL RESULT CALLED TO, READ BACK BY AND VERIFIED WITH: TERRI COCKMAN, RRT RCP AT 0415 BY FRANK MIKE JR RRT,RCP ON 05/16/2018    pO2,  Arterial 244 (H) 83.0 - 108.0 mmHg   Bicarbonate 25.9 20.0 - 28.0 mmol/L   Acid-base deficit 3.6 (H) 0.0 - 2.0 mmol/L   O2 Saturation 98.8 %   Patient temperature 98.6    Collection site  LEFT RADIAL    Drawn by [phone]    Sample type ARTERIAL    Allens test (pass/fail) PASS PASS  I-STAT 7, (LYTES, BLD GAS, ICA, H+H)     Status: Abnormal   Collection Time: 05/16/18  5:33 AM  Result Value Ref Range   pH, Arterial 7.387 7.350 - 7.450   pCO2 arterial 44.1 32.0 - 48.0 mmHg   pO2, Arterial 75.0 (L) 83.0 - 108.0 mmHg   Bicarbonate 26.5 20.0 - 28.0 mmol/L   TCO2 28 22 - 32 mmol/L   O2 Saturation 95.0 %   Acid-Base Excess 1.0 0.0 - 2.0 mmol/L   Sodium 132 (L) 135 - 145 mmol/L   Potassium 3.2 (L) 3.5 - 5.1 mmol/L   Calcium, Ion 1.08 (L) 1.15 - 1.40 mmol/L   HCT 31.0 (L) 36.0 - 46.0 %   Hemoglobin 10.5 (L) 12.0 - 15.0 g/dL   Patient temperature 98.6 F    Collection site RADIAL, ALLEN'S TEST ACCEPTABLE    Drawn by RT    Sample type ARTERIAL    Dg Chest Portable 1 View  Result Date: 05/16/2018 CLINICAL DATA:  Shortness of breath EXAM: PORTABLE CHEST 1 VIEW COMPARISON:  None. FINDINGS: Endotracheal tube tip is at the level of the clavicular heads. Orogastric tube tip and side port are below the field of view. Moderate cardiomegaly with hazy bilateral opacities. No pneumothorax or sizable pleural effusion. IMPRESSION: 1. Radiographically appropriate position of endotracheal tube. 2. Cardiomegaly with bilateral hazy opacities which may indicate pulmonary edema or infection. Electronically Signed   By: Ulyses Jarred M.D.   On: 05/16/2018 04:24    Pending Labs Unresulted Labs (From admission, onward)    Start     Ordered   05/17/18 0500  Comprehensive metabolic panel  Tomorrow morning,   R     05/16/18 0522   05/17/18 0500  Magnesium  Tomorrow morning,   R     05/16/18 0522   05/16/18 0529  Triglycerides  (propofol (DIPRIVAN))  Every 72 hours,   R    Comments:  While on propofol (DIPRIVAN)    05/16/18 0529   05/16/18 0524  Ferritin  Once,   R     05/16/18 0524   05/16/18 0523  C-reactive protein  Once,   R     05/16/18 0524   05/16/18 0522  Procalcitonin  Once,   R      05/16/18 0522   05/16/18 0522  D-dimer, quantitative (not at [facility])  Once,   R     05/16/18 0522   05/16/18 0522  Strep pneumoniae urinary antigen  (not at [facility])  Once,   R     05/16/18 0522   05/16/18 0522  Legionella Pneumophila Serogp 1 Ur Ag  Once,   R     05/16/18 0522   05/16/18 0522  Blood gas, arterial  Once,   R     05/16/18 0522   05/16/18 0518  HIV antibody (Routine Testing)  Once,   R     05/16/18 0522   05/16/18 0517  Novel Coronavirus, NAA (hospital order; send-out to ref lab)  (Novel Coronavirus, NAA [facility] Order; send-out to ref lab) with precautions panel)  ONCE - STAT,   R  Question Answer Comment  Current symptoms Fever and Shortness of breath   Excluded other viral illnesses No (testing not indicated)      05/16/18 0522   05/16/18 0358  Lactic acid, plasma  Now then every 2 hours,   STAT     05/16/18 0357   05/16/18 0340  Brain natriuretic peptide  Once,   R     05/16/18 0353          Vitals/Pain Today's Vitals   05/16/18 0510 05/16/18 0520 05/16/18 0530 05/16/18 0540  BP: 132/75 (!) 173/85 (!) 164/78 (!) 161/78  Pulse: 75 79 80 77  Resp: (!) 25 (!) 25 (!) 25 20  Temp:      TempSrc:      SpO2: 98% 99% 99% 98%  Weight:      Height:        Isolation Precautions Airborne and Contact precautions  Medications Medications  propofol (DIPRIVAN) 1000 MG/100ML infusion (has no administration in time range)  heparin injection 5,000 Units (has no administration in time range)  acetaminophen (TYLENOL) tablet 650 mg (has no administration in time range)  ondansetron (ZOFRAN) injection 4 mg (has no administration in time range)  pantoprazole (PROTONIX) injection 40 mg (has no administration in time range)  linezolid (ZYVOX) IVPB 600 mg (has no administration in time range)  propofol (DIPRIVAN) 1000 MG/100ML infusion (has no administration in time range)  fentaNYL (SUBLIMAZE) injection 50 mcg (has no administration in time range)  fentaNYL 257mcg in NS  29mL (8mcg/ml) infusion-PREMIX (has no administration in time range)  fentaNYL (SUBLIMAZE) bolus via infusion 50 mcg (has no administration in time range)  sennosides (SENOKOT) 8.8 MG/5ML syrup 5 mL (has no administration in time range)  bisacodyl (DULCOLAX) suppository 10 mg (has no administration in time range)  methylPREDNISolone sodium succinate (SOLU-MEDROL) 125 mg/2 mL injection 60 mg (has no administration in time range)  piperacillin-tazobactam (ZOSYN) IVPB 3.375 g (has no administration in time range)  piperacillin-tazobactam (ZOSYN) IVPB 3.375 g (has no administration in time range)  0.9 %  sodium chloride infusion (1,000 mLs Intravenous New Bag/Given 05/16/18 0346)  etomidate (AMIDATE) injection (20 mg Intravenous Given 05/16/18 0348)  rocuronium (ZEMURON) injection (100 mg Intravenous Given 05/16/18 0348)    Mobility walks     Focused Assessments Pulmonary Assessment Handoff:  Lung sounds: Bilateral Breath Sounds: Diminished, Expiratory wheezes O2 Device: Ventilator        R Recommendations: See Admitting Provider Note  Report given to:   Additional Notes: COVID r/o

## 2018-05-16 NOTE — Consult Note (Addendum)
Renal Service Consult Note [facility]  Jodi Bell 05/16/2018 Jodi Bell Requesting Physician:  Dr Jodi Bell, N  Reason for Consult:  ESRD pt w/ resp failure HPI: The patient is a 58 y.o. year-old with hx of DM2, HTN, COPD, obesity and ESRD on HD in [facility] for about 6 wks (hx obtained from daughter) on MWF schedule. Pt presented this am SOB/ resp distress  Initial sat "20%", improved on NRB but pt remained tachypenic and altered and was intubated.  No sick contacts per family. CXR R > L infiltrates. Admitted to ICU, temp 95.8 lowest and 99.5 this am.  Asked to see for ESRD.    Patient sedated unable to give hx.  Spoke w/ daughter on the phone.    ROS  denies CP  no joint pain   no HA  no blurry vision  no rash  no diarrhea  no nausea/ vomiting   PMH: HTN, DM2, COPD, PNA, obestiy, ESRD on HD PSH: back surg, Csection Allergies: none  Home medications Prior to Admission medications   Medication Sig Start Date End Date Taking? Authorizing Provider  allopurinol (ZYLOPRIM) 300 MG tablet Take 150-300 mg by mouth daily.   Yes [provider]  amLODipine (NORVASC) 10 MG tablet Take 10 mg by mouth daily.   Yes [provider]  bisoprolol (ZEBETA) 10 MG tablet Take 20 mg by mouth daily.   Yes [provider]  budesonide-formoterol (SYMBICORT) 160-4.5 MCG/ACT inhaler Inhale 2 puffs into the lungs 2 (two) times daily.   Yes [provider]  busPIRone (BUSPAR) 5 MG tablet Take 5 mg by mouth 2 (two) times daily.   Yes [provider]  cloNIDine (CATAPRES) 0.2 MG tablet Take 0.2 mg by mouth 2 (two) times daily. 02/04/18  Yes [provider]  ezetimibe (ZETIA) 10 MG tablet Take 10 mg by mouth daily. 12/25/17  Yes [provider]  furosemide (LASIX) 40 MG tablet Take 40 mg by mouth 2 (two) times daily.   Yes [provider]  gabapentin (NEURONTIN) 300 MG capsule Take 300 mg by mouth at  bedtime.   Yes [provider]  hydrALAZINE (APRESOLINE) 50 MG tablet Take 50 mg by mouth every 8 (eight) hours.   Yes [provider]  Insulin Degludec (TRESIBA FLEXTOUCH) 200 UNIT/ML SOPN Inject 14 Units into the skin at bedtime.   Yes [provider]  isosorbide mononitrate (IMDUR) 60 MG 24 hr tablet Take 60 mg by mouth daily.   Yes [provider]  montelukast (SINGULAIR) 10 MG tablet Take 10 mg by mouth at bedtime. 04/04/18  Yes [provider]  oxyCODONE (OXYCONTIN) 10 mg 12 hr tablet Take 10 mg by mouth every 12 (twelve) hours.   Yes [provider]  sitaGLIPtin (JANUVIA) 100 MG tablet Take 100 mg by mouth daily.   Yes [provider]  Vitamin D, Ergocalciferol, (DRISDOL) 1.25 MG (50000 UT) CAPS capsule Take 50,000 Units by mouth every 7 (seven) days.   Yes [provider]  zolpidem (AMBIEN) 10 MG tablet Take 10 mg by mouth at bedtime.   Yes [provider]   Liver Function Tests Recent Labs  Lab 05/16/18 0401  AST 27  ALT 14  ALKPHOS 144*  BILITOT 0.4  PROT 7.4  ALBUMIN 3.0*   No results for input(s): LIPASE, AMYLASE in the last 168 hours. CBC Recent Labs  Lab 05/16/18 0400 05/16/18 0533 05/16/18 0951  WBC 11.2*  --   --  NEUTROABS 5.6  --   --   HGB 10.6* 10.5* 8.8*  HCT 34.1* 31.0* 26.0*  MCV 97.4  --   --   PLT 347  --   --    Basic Metabolic Panel Recent Labs  Lab 05/16/18 0401 05/16/18 0533 05/16/18 0951  NA 130* 132* 131*  K 3.4* 3.2* 3.4*  CL 92*  --   --   CO2 24  --   --   GLUCOSE 142*  --   --   BUN 19  --   --   CREATININE 5.79*  --   --   CALCIUM 8.5*  --   --    Iron/TIBC/Ferritin/ %Sat    Component Value Date/Time   FERRITIN 848 (H) 05/16/2018 0400    Vitals:   05/16/18 1000 05/16/18 1030 05/16/18 1100 05/16/18 1130  BP: (!) 151/79 128/73 (!) 159/85 124/72  Pulse: 75 71 80 73  Resp: (!) 25 (!) 25 (!) 25 (!) 25  Temp:    (!) 97.2 F (36.2 C)  TempSrc:       SpO2: 100% 100% 100% 99%  Weight:      Height:       Exam Gen examined in room 4/5, on vent, sedated , not responding  No rash, cyanosis or gangrene Sclera anicteric, throat w ETT  No jvd or bruits, flat neck veins Chest no rales or wheezing RRR no MRG Abd soft ntnd no mass or ascites +bs obese GU deferred MS no joint effusions or deformity Ext no LE or UE or abd wall edema Neuro is unresponsive on the vent  UE AVF+bruit    Home meds:  - amlodipine 10/ bisoprolol 20/ clonidine 0.2 bid/ lasix 40 bid/ hydralazine 50 tid  - insulin degludec 14 hs/ sitagliptin 100 qd  - allopurinol 300  - ezetimibe 10/ isosorbide mononitrate 60 qd  - oxycontin 10 bid/ gabapentin 300 hs/ buspirone 5 bid  - budesonide-formoterol 2 puffs bid   - prn's/ vitamins/ supplements   Assessment/ Rec: 1. Acute resp failure - in patient recently started on dialysis 6 wks ago , gets HD in [facility] w[facility].  Bilat pulm infiltrates, not sure infectious or non-infectious (edema).  On exam is euvolemic.  Does not need RRT form a solute standpoint at this time. Pt will most likely declare herself in next 48hrs as to cardiogenic or non-cardiogenic edema. If 3-lumen is placed , CVP's might be helpful.  Will follow.  2. ESRD on HD MWF - started 6 wks ago 3. DM2 on insulin 4. HTN - on 4 bp meds and still takes some lasix. She was given IV lasix high dose 1 time today, results pending.  5. H/o gout 6. H/o HL    [employer] 05/16/2018, 12:22 PM

## 2018-05-16 NOTE — ED Provider Notes (Addendum)
[facility] Provider Note   CSN: [id number] Arrival date & time: 05/16/18  0334    History   Chief Complaint Chief Complaint  Patient presents with  . Shortness of Breath    HPI Jodi Bell is a 58 y.o. female.     HPI  This is a 58 year old female with history of end-stage renal disease on dialysis Monday, Wednesday, Friday, asthma who presents with respiratory distress.  I was unable to obtain a history from the patient given her respiratory status.  Per EMS, she had progressive shortness of breath beginning yesterday.  She was given a DuoNeb and Solu-Medrol in route with minimal improvement.  On my evaluation she is in acute distress.  She speaks in short sentences.  She appears confused.  I was able to speak to the patient's daughter.  Patient's daughter said she complained of some shortness of breath last night requiring her to sit up more.  She called her daughter this morning.  Daughter states that she took a nebulizer treatment but had difficulty getting it on herself and required extra help.  Daughter denies any specific sick contacts.  Does report increased allergies in the family.  Reports that she did go to her dialysis session on Friday as scheduled  Level 5 caveat for acuity of condition.  No past medical history on file.  There are no active problems to display for this patient.  OB History   No obstetric history on file.   Past medical, family, and social history unable to obtain secondary to mental status.  Level 5 caveat.   Home Medications    Prior to Admission medications   Not on File    Family History No family history on file.  Social History Social History   Tobacco Use  . Smoking status: Not on file  Substance Use Topics  . Alcohol use: Not on file  . Drug use: Not on file     Allergies   Patient has no allergy information on record.   Review of Systems Review of Systems  Unable to  perform ROS: Acuity of condition     Physical Exam Updated Vital Signs BP (!) 180/95   Pulse 87   Temp (!) 96.3 F (35.7 C) (Rectal)   Resp 18   Ht 1.575 m (5\' 2" )   Wt 78.9 kg   SpO2 100%   BMI 31.83 kg/m   Physical Exam Vitals signs and nursing note reviewed.  Constitutional:      Comments: Disoriented, tachypneic, acute respiratory distress noted  HENT:     Head: Normocephalic and atraumatic.  Eyes:     Pupils: Pupils are equal, round, and reactive to light.  Neck:     Musculoskeletal: Neck supple.  Cardiovascular:     Rate and Rhythm: Regular rhythm.     Heart sounds: Normal heart sounds.     Comments: Tachycardia Pulmonary:     Effort: Respiratory distress present.     Comments: Minimal air movement, speaking in short sentences, tachypnea noted, accessory muscle use, slight wheeze right lung fields Abdominal:     General: Bowel sounds are normal.     Palpations: Abdomen is soft.  Musculoskeletal:     Right lower leg: Edema present.     Left lower leg: Edema present.     Comments: Trace bilateral lower extremity edema  Skin:    General: Skin is warm and dry.  Neurological:     Mental Status: She  is alert and oriented to person, place, and time.      ED Treatments / Results  Labs (all labs ordered are listed, but only abnormal results are displayed) Labs Reviewed  BLOOD GAS, ARTERIAL - Abnormal; Notable for the following components:      Result Value   pH, Arterial 7.056 (*)    pCO2 arterial 96.7 (*)    pO2, Arterial 244 (*)    Acid-base deficit 3.6 (*)    All other components within normal limits  CULTURE, BLOOD (ROUTINE X 2)  CULTURE, BLOOD (ROUTINE X 2)  CBC WITH DIFFERENTIAL/PLATELET  BRAIN NATRIURETIC PEPTIDE  TROPONIN I  LACTIC ACID, PLASMA  LACTIC ACID, PLASMA  COMPREHENSIVE METABOLIC PANEL  I-STAT ARTERIAL BLOOD GAS, ED    EKG None  Radiology Dg Chest Portable 1 View  Result Date: 05/16/2018 CLINICAL DATA:  Shortness of breath  EXAM: PORTABLE CHEST 1 VIEW COMPARISON:  None. FINDINGS: Endotracheal tube tip is at the level of the clavicular heads. Orogastric tube tip and side port are below the field of view. Moderate cardiomegaly with hazy bilateral opacities. No pneumothorax or sizable pleural effusion. IMPRESSION: 1. Radiographically appropriate position of endotracheal tube. 2. Cardiomegaly with bilateral hazy opacities which may indicate pulmonary edema or infection. Electronically Signed   By: Ulyses Jarred M.D.   On: 05/16/2018 04:24    Procedures .Critical Care Performed by: Merryl Hacker, MD Authorized by: Merryl Hacker, MD   Critical care provider statement:    Critical care time (minutes):  45   Critical care time was exclusive of:  Separately billable procedures and treating other patients   Critical care was necessary to treat or prevent imminent or life-threatening deterioration of the following conditions:  Respiratory failure   Critical care was time spent personally by me on the following activities:  Discussions with consultants, evaluation of patient's response to treatment, examination of patient, ordering and performing treatments and interventions, ordering and review of laboratory studies, ordering and review of radiographic studies, pulse oximetry, re-evaluation of patient's condition, obtaining history from patient or surrogate and review of old charts Procedure Name: Intubation Date/Time: 05/16/2018 4:13 AM Performed by: Merryl Hacker, MD Pre-anesthesia Checklist: Patient identified, Patient being monitored, Emergency Drugs available, Timeout performed and Suction available Oxygen Delivery Method: Nasal cannula Preoxygenation: Pre-oxygenation with 100% oxygen Induction Type: Rapid sequence Ventilation: Mask ventilation without difficulty Laryngoscope Size: Glidescope and 3 Grade View: Grade I Tube size: 7.5 mm Number of attempts: 1 Placement Confirmation: ETT inserted through  vocal cords under direct vision,  CO2 detector and Breath sounds checked- equal and bilateral Secured at: 22 cm Tube secured with: ETT holder Dental Injury: Teeth and Oropharynx as per pre-operative assessment       (including critical care time)  Medications Ordered in ED Medications  0.9 %  sodium chloride infusion (1,000 mLs Intravenous New Bag/Given 05/16/18 0346)  propofol (DIPRIVAN) 1000 MG/100ML infusion (has no administration in time range)  etomidate (AMIDATE) injection (20 mg Intravenous Given 05/16/18 0348)  rocuronium (ZEMURON) injection (100 mg Intravenous Given 05/16/18 0348)  propofol (DIPRIVAN) 1000 MG/100ML infusion (10 mcg/kg/min  78.9 kg Intravenous New Bag/Given 05/16/18 0413)     Initial Impression / Assessment and Plan / ED Course  I have reviewed the triage vital signs and the nursing notes.  Pertinent labs & imaging results that were available during my care of the patient were reviewed by me and considered in my medical decision making (see chart for details).  Patient presents in acute respiratory distress.  Minimal history obtained on initial evaluation.  She has a history of asthma as well as end-stage renal disease.  She does not appear overtly volume overloaded.  Minimal air movement on exam.  Initial O2 sats on room air in the mid 20s with a good waveform.  She was placed on a nonrebreather with improvement.  Given her mental status and respiratory compromise, decision was made to intubate the patient.    Blood gas shows respiratory acidosis with pH of 7.056 and a PCO2 of 96.7.  Additionally chest x-ray with bilateral opacities concerning for infection versus pulmonary edema.  Critical care is at the bedside.  Antibiotics deferred to critical care.  Additional lab work pending.  Jodi Bell was evaluated in Emergency Department on 05/16/2018 for the symptoms described in the history of present illness. She was evaluated in the context of the global  COVID-19 pandemic, which necessitated consideration that the patient might be at risk for infection with the SARS-CoV-2 virus that causes COVID-19. Institutional protocols and algorithms that pertain to the evaluation of patients at risk for COVID-19 are in a state of rapid change based on information released by regulatory bodies including the CDC and federal and state organizations. These policies and algorithms were followed during the patient's care in the ED.   Final Clinical Impressions(s) / ED Diagnoses   Final diagnoses:  Acute respiratory failure with hypoxia and hypercapnia [facility])    ED Discharge Orders    None       Zymir Napoli, Barbette Hair, MD 05/16/18 4917    Merryl Hacker, MD 05/16/18 [phone]

## 2018-05-16 NOTE — Progress Notes (Signed)
Pharmacy Antibiotic Note  Jodi Bell is a 58 y.o. female admitted on 05/16/2018 with pneumonia.  Pharmacy has been consulted for zosyn dosing.  Plan: Zosyn 3.375 gm IV q12 hours F/u cultures and clinical course  Height: 5\' 2"  (157.5 cm) Weight: 174 lb (78.9 kg) IBW/kg (Calculated) : 50.1  Temp (24hrs), Avg:96.3 F (35.7 C), Min:96.3 F (35.7 C), Max:96.3 F (35.7 C)  Recent Labs  Lab 05/16/18 0400 05/16/18 0401  WBC 11.2*  --   CREATININE  --  5.79*  LATICACIDVEN 1.6  --     Estimated Creatinine Clearance: 10.3 mL/min (A) (by C-G formula based on SCr of 5.79 mg/dL (H)).    Allergies not on file  Thank you for allowing pharmacy to be a part of this patient's care.  Beverlee Nims 05/16/2018 5:36 AM

## 2018-05-16 NOTE — H&P (Addendum)
..   NAME:  Jodi Bell, MRN:  [id number], DOB:  1960/06/02, LOS: 0 ADMISSION DATE:  05/16/2018, CONSULTATION DATE:  05/16/18 REFERRING MD:  Chryl Heck, CHIEF COMPLAINT:  Shortness of breath  Brief History   58 year old woman with history of asthma, ESRD on dialysis MWF with sudden onset shortness of breath since yesterday, 4/4 am.    History of present illness   Dyspnea at rest since yesterday am (4/4).  Lives with daughter.  Apparently was unable to get her nebulizer treatment done on her own, which is unusual for her.  Patient called her to come assist her daughter this evening, found her to be very distressed, short of breath and unable to complete sentences, so EMS was called.  She was given duoneb and solumedrol in route.   She was intubated in the er for severe respiratory distress, tachypnea, confusion. Initial sat "20%", improved to 90 with NRB but remained tachypneic and altered.  Per daughter, no sick contacts.   Last dialysis Friday as scheduled with no problems.    She was hypertensive at the time of intubation, which improved with sedation and intubation.  Started on propofol infusion.    Past Medical History  Asthma (has nebulizer at home) ESRD - Nephrologist is Dr. Quincy Simmonds HTN   [facility] Events   Intubated 4/4   Consults:    Procedures:  Intubated 4/4  Significant Diagnostic Tests:   CXR 05/16/18:  Moderate cardiomegaly with hazy bilateral opacities. No pneumothorax or sizable pleural effusion.  IMPRESSION: 1. Radiographically appropriate position of endotracheal tube. 2. Cardiomegaly with bilateral hazy opacities which may indicate pulmonary edema or infection.  Micro Data:  Blood cultures  Antimicrobials:   linezolid 4/5 Zosyn 4/5  Interim history/subjective:    Objective   Blood pressure (!) 156/81, pulse 80, temperature (!) 96.3 F (35.7 C), temperature source Rectal, resp. rate (!) 22, height 5\' 2"  (1.575 m), weight 78.9 kg, SpO2 97  %.    Vent Mode: PRVC FiO2 (%):  [100 %] 100 % Set Rate:  [18 bmp] 18 bmp Vt Set:  [520 mL] 520 mL PEEP:  [5 cmH20] 5 cmH20 Plateau Pressure:  [30 cmH20] 30 cmH20  No intake or output data in the 24 hours ending 05/16/18 0447   At the time of my exam:  PRVC, Vt 390, Peep 5 RR 25 FiO2 40% Peak pressure 21 Filed Weights   05/16/18 0402  Weight: 78.9 kg    Examination: General: sedated, paralyzed  HENT: p equal and round Lungs: poor air movement Cardiovascular: rrr Abdomen: non distended.   Neuro: sedated and paralysed  Resolved Hospital Problem list     Assessment & Plan:  Acute respiratory failure, hypoxic and hypercarbic:  AMS likely 2/2 severe hypercarbia prior to intubation.  Repeat ABG shows improvement. Cont current settings.   DDX: COVID-19, bacterial PNA, CHF.  Volume status - appears euvolemic.   CRP, ferritin, procal, BNP pending.  Trop negative.  Urine legionella and strep ag pending.   Empiric antibiotics started, broad coverage for possible MDR given HD.  Linezolid and zosyn.  Solumedrol for asthma exacerbation component.    Hx Asthma: albuterol nebs.  Awaiting drug list from daughter.   ESRD:  Renal consult in AM for dialysis.  AF fistula is access.    HTN: monitor.  Initially hypertensive prior to intubation.  Now in normal range.    Full Code.    Daughter is Beatriz Chancellor [phone]  Best practice:  Diet:NPO  Pain/Anxiety/Delirium protocol (if indicated): yes VAP protocol (if indicated): yes DVT prophylaxis: heparin GI prophylaxis: protonix Glucose control: ISS Mobility: bed bound Code Status: Full Code  Family Communication: Daughter  Disposition:   Labs   CBC: No results for input(s): WBC, NEUTROABS, HGB, HCT, MCV, PLT in the last 168 hours.  Basic Metabolic Panel: No results for input(s): NA, K, CL, CO2, GLUCOSE, BUN, CREATININE, CALCIUM, MG, PHOS in the last 168 hours. GFR: CrCl cannot be calculated (No successful lab value found.).  No results for input(s): PROCALCITON, WBC, LATICACIDVEN in the last 168 hours.  Liver Function Tests: No results for input(s): AST, ALT, ALKPHOS, BILITOT, PROT, ALBUMIN in the last 168 hours. No results for input(s): LIPASE, AMYLASE in the last 168 hours. No results for input(s): AMMONIA in the last 168 hours.  ABG    Component Value Date/Time   PHART 7.056 (LL) 05/16/2018 0411   PCO2ART 96.7 (HH) 05/16/2018 0411   PO2ART 244 (H) 05/16/2018 0411   HCO3 25.9 05/16/2018 0411   ACIDBASEDEF 3.6 (H) 05/16/2018 0411   O2SAT 98.8 05/16/2018 0411     Coagulation Profile: No results for input(s): INR, PROTIME in the last 168 hours.  Cardiac Enzymes: No results for input(s): CKTOTAL, CKMB, CKMBINDEX, TROPONINI in the last 168 hours.  HbA1C: No results found for: HGBA1C  CBG: No results for input(s): GLUCAP in the last 168 hours.  Review of Systems:   Unable to assess.   Past Medical History  She,  has no past medical history on file.   Surgical History      Social History      Family History   Her family history is not on file.   Allergies Allergies not on file   Home Medications  Prior to Admission medications   Not on File     Critical care time: 60      Additional problem: QTC prolongation >500

## 2018-05-16 NOTE — ED Notes (Signed)
Pts daughter Beatriz Chancellor would like a pt status @ [phone]

## 2018-05-16 NOTE — Progress Notes (Signed)
Pt transported from ED to 43m 16 after suctioning without event.  Rt will monitor.

## 2018-05-16 NOTE — Significant Event (Signed)
    Entered room to attempt to place hemodialysis catheter. Patient was awake and alert despite being on 300 mcg of fentanyl. Gave her a sheet of paper and a pen and she expressed the fact she does not want a hemodialysis catheter at this time. I spent in excess of 25 minutes talking to her about the need for hemodialysis catheter.  The fact that we were not Normal hemodialysis in intensive care unit but doing CRRT due to the COVID-19 virus.  She again refused to have a catheter placed at this time.  Spoke with Dr. Valeta Harms and he will speak with nephrology concerning intermittent hemodialysis.  Summary hemodialysis catheter was not placed due to the request of the patient.    05/16/2018 1300 hrs.  Richardson Landry Jahzeel Poythress ACNP Maryanna Shape PCCM Pager [phone] till 1 pm If no answer page [phone] 05/16/2018, 1:09 PM

## 2018-05-17 ENCOUNTER — Inpatient Hospital Stay (HOSPITAL_COMMUNITY): Payer: Medicare PPO

## 2018-05-17 DIAGNOSIS — R6889 Other general symptoms and signs: Secondary | ICD-10-CM

## 2018-05-17 DIAGNOSIS — Z9911 Dependence on respirator [ventilator] status: Secondary | ICD-10-CM

## 2018-05-17 DIAGNOSIS — N186 End stage renal disease: Secondary | ICD-10-CM

## 2018-05-17 DIAGNOSIS — J45909 Unspecified asthma, uncomplicated: Secondary | ICD-10-CM

## 2018-05-17 LAB — GLUCOSE, CAPILLARY
Glucose-Capillary: 103 mg/dL — ABNORMAL HIGH (ref 70–99)
Glucose-Capillary: 109 mg/dL — ABNORMAL HIGH (ref 70–99)
Glucose-Capillary: 141 mg/dL — ABNORMAL HIGH (ref 70–99)
Glucose-Capillary: 153 mg/dL — ABNORMAL HIGH (ref 70–99)
Glucose-Capillary: 176 mg/dL — ABNORMAL HIGH (ref 70–99)
Glucose-Capillary: 83 mg/dL (ref 70–99)

## 2018-05-17 LAB — COMPREHENSIVE METABOLIC PANEL
ALT: 11 U/L (ref 0–44)
AST: 21 U/L (ref 15–41)
Albumin: 2.4 g/dL — ABNORMAL LOW (ref 3.5–5.0)
Alkaline Phosphatase: 100 U/L (ref 38–126)
Anion gap: 17 — ABNORMAL HIGH (ref 5–15)
BUN: 28 mg/dL — ABNORMAL HIGH (ref 6–20)
CO2: 20 mmol/L — ABNORMAL LOW (ref 22–32)
Calcium: 8.3 mg/dL — ABNORMAL LOW (ref 8.9–10.3)
Chloride: 93 mmol/L — ABNORMAL LOW (ref 98–111)
Creatinine, Ser: 7.26 mg/dL — ABNORMAL HIGH (ref 0.44–1.00)
GFR calc Af Amer: 7 mL/min — ABNORMAL LOW (ref >60)
GFR calc non Af Amer: 6 mL/min — ABNORMAL LOW (ref >60)
Glucose, Bld: 88 mg/dL (ref 70–99)
Potassium: 3.9 mmol/L (ref 3.5–5.1)
Sodium: 130 mmol/L — ABNORMAL LOW (ref 135–145)
Total Bilirubin: 0.5 mg/dL (ref 0.3–1.2)
Total Protein: 5.9 g/dL — ABNORMAL LOW (ref 6.5–8.1)

## 2018-05-17 LAB — POCT I-STAT 7, (LYTES, BLD GAS, ICA,H+H)
Acid-Base Excess: 2 mmol/L (ref 0.0–2.0)
Bicarbonate: 26.5 mmol/L (ref 20.0–28.0)
Calcium, Ion: 1.09 mmol/L — ABNORMAL LOW (ref 1.15–1.40)
HCT: 38 % (ref 36.0–46.0)
Hemoglobin: 12.9 g/dL (ref 12.0–15.0)
O2 Saturation: 100 %
Patient temperature: 97.9
Potassium: 3.6 mmol/L (ref 3.5–5.1)
Sodium: 130 mmol/L — ABNORMAL LOW (ref 135–145)
TCO2: 28 mmol/L (ref 22–32)
pCO2 arterial: 37.7 mmHg (ref 32.0–48.0)
pH, Arterial: 7.454 — ABNORMAL HIGH (ref 7.350–7.450)
pO2, Arterial: 169 mmHg — ABNORMAL HIGH (ref 83.0–108.0)

## 2018-05-17 LAB — NOVEL CORONAVIRUS, NAA (HOSP ORDER, SEND-OUT TO REF LAB; TAT 18-24 HRS): SARS-CoV-2, NAA: NOT DETECTED

## 2018-05-17 LAB — CBC WITH DIFFERENTIAL/PLATELET
Abs Immature Granulocytes: 0.03 10*3/uL (ref 0.00–0.07)
Basophils Absolute: 0 10*3/uL (ref 0.0–0.1)
Basophils Relative: 0 %
Eosinophils Absolute: 0 10*3/uL (ref 0.0–0.5)
Eosinophils Relative: 0 %
HCT: 25.8 % — ABNORMAL LOW (ref 36.0–46.0)
Hemoglobin: 8.5 g/dL — ABNORMAL LOW (ref 12.0–15.0)
Immature Granulocytes: 0 %
Lymphocytes Relative: 11 %
Lymphs Abs: 0.8 10*3/uL (ref 0.7–4.0)
MCH: 30.2 pg (ref 26.0–34.0)
MCHC: 32.9 g/dL (ref 30.0–36.0)
MCV: 91.8 fL (ref 80.0–100.0)
Monocytes Absolute: 0.5 10*3/uL (ref 0.1–1.0)
Monocytes Relative: 7 %
Neutro Abs: 6.2 10*3/uL (ref 1.7–7.7)
Neutrophils Relative %: 82 %
Platelets: 233 10*3/uL (ref 150–400)
RBC: 2.81 MIL/uL — ABNORMAL LOW (ref 3.87–5.11)
RDW: 15.9 % — ABNORMAL HIGH (ref 11.5–15.5)
WBC: 7.6 10*3/uL (ref 4.0–10.5)
nRBC: 0 % (ref 0.0–0.2)

## 2018-05-17 LAB — PHOSPHORUS: Phosphorus: 5.6 mg/dL — ABNORMAL HIGH (ref 2.5–4.6)

## 2018-05-17 LAB — LEGIONELLA PNEUMOPHILA SEROGP 1 UR AG: L. pneumophila Serogp 1 Ur Ag: NEGATIVE

## 2018-05-17 LAB — MAGNESIUM: Magnesium: 2.1 mg/dL (ref 1.7–2.4)

## 2018-05-17 MED ORDER — ORAL CARE MOUTH RINSE
15.0000 mL | Freq: Two times a day (BID) | OROMUCOSAL | Status: DC
  Administered 2018-05-17 – 2018-05-19 (×5): 15 mL via OROMUCOSAL

## 2018-05-17 MED ORDER — ALBUTEROL SULFATE HFA 108 (90 BASE) MCG/ACT IN AERS
2.0000 | INHALATION_SPRAY | Freq: Four times a day (QID) | RESPIRATORY_TRACT | Status: DC
  Filled 2018-05-17: qty 6.7

## 2018-05-17 MED ORDER — ALBUTEROL SULFATE (2.5 MG/3ML) 0.083% IN NEBU
2.5000 mg | INHALATION_SOLUTION | Freq: Four times a day (QID) | RESPIRATORY_TRACT | Status: DC
  Administered 2018-05-17: 2.5 mg via RESPIRATORY_TRACT
  Filled 2018-05-17: qty 3

## 2018-05-17 MED ORDER — VITAL AF 1.2 CAL PO LIQD: 1000.0000 mL | ORAL | Status: DC

## 2018-05-17 MED ORDER — AMLODIPINE BESYLATE 10 MG PO TABS
10.0000 mg | ORAL_TABLET | Freq: Every day | ORAL | Status: DC
  Administered 2018-05-17 – 2018-05-20 (×4): 10 mg via ORAL
  Filled 2018-05-17 (×4): qty 1

## 2018-05-17 MED ORDER — ALBUTEROL SULFATE (2.5 MG/3ML) 0.083% IN NEBU: 2.5000 mg | INHALATION_SOLUTION | Freq: Four times a day (QID) | RESPIRATORY_TRACT | Status: DC

## 2018-05-17 MED ORDER — ALBUTEROL SULFATE HFA 108 (90 BASE) MCG/ACT IN AERS
2.0000 | INHALATION_SPRAY | Freq: Four times a day (QID) | RESPIRATORY_TRACT | Status: DC
  Administered 2018-05-17 – 2018-05-18 (×3): 2 via RESPIRATORY_TRACT
  Filled 2018-05-17: qty 6.7

## 2018-05-17 MED ORDER — CLONIDINE HCL 0.2 MG PO TABS
0.2000 mg | ORAL_TABLET | Freq: Two times a day (BID) | ORAL | Status: DC
  Administered 2018-05-17 – 2018-05-20 (×6): 0.2 mg via ORAL
  Filled 2018-05-17 (×6): qty 1

## 2018-05-17 MED ORDER — PRO-STAT SUGAR FREE PO LIQD
30.0000 mL | Freq: Three times a day (TID) | ORAL | Status: DC
  Administered 2018-05-18: 30 mL
  Filled 2018-05-17: qty 30

## 2018-05-17 MED ORDER — HEPARIN SODIUM (PORCINE) 5000 UNIT/ML IJ SOLN
5000.0000 [IU] | Freq: Three times a day (TID) | INTRAMUSCULAR | Status: DC
  Administered 2018-05-17 – 2018-05-20 (×8): 5000 [IU] via SUBCUTANEOUS
  Filled 2018-05-17 (×8): qty 1

## 2018-05-17 MED ORDER — ALBUTEROL SULFATE (2.5 MG/3ML) 0.083% IN NEBU
INHALATION_SOLUTION | RESPIRATORY_TRACT | Status: AC
  Administered 2018-05-17: 2.5 mg
  Filled 2018-05-17: qty 3

## 2018-05-17 MED ORDER — DEXMEDETOMIDINE HCL IN NACL 400 MCG/100ML IV SOLN
0.4000 ug/kg/h | INTRAVENOUS | Status: DC
  Administered 2018-05-17: 0.4 ug/kg/h via INTRAVENOUS
  Filled 2018-05-17: qty 100

## 2018-05-17 NOTE — Progress Notes (Addendum)
NAME:  Jodi Bell, MRN:  [id number], DOB:  02/14/1960, LOS: 1 ADMISSION DATE:  05/16/2018, CONSULTATION DATE:  05/16/18 REFERRING MD:  Chryl Heck, CHIEF COMPLAINT:  Shortness of breath  Brief History   58 year old woman with history of asthma, ESRD on dialysis MWF with sudden onset shortness of breath since yesterday, 4/4 am.    History of present illness   Dyspnea at rest since yesterday am (4/4).  Lives with daughter.  Apparently was unable to get her nebulizer treatment done on her own, which is unusual for her.  Patient called her to come assist her daughter this evening, found her to be very distressed, short of breath and unable to complete sentences, so EMS was called.  She was given duoneb and solumedrol in route.   She was intubated in the er for severe respiratory distress, tachypnea, confusion. Initial sat "20%", improved to 90 with NRB but remained tachypneic and altered.  Per daughter, no sick contacts.   Last dialysis Friday as scheduled with no problems.    She was hypertensive at the time of intubation, which improved with sedation and intubation.  Started on propofol infusion.  2020 propofol discontinued due to hypotension   Past Medical History  Asthma (has nebulizer at home) ESRD - Nephrologist is Dr. Quincy Simmonds HTN  [place] [facility] Events   Intubated 4/4 4/5> refused placement of trialysis catheter  Consults:  05/16/2018 renal>>  Procedures:  Intubated 4/4  Significant Diagnostic Tests:   CXR 05/16/18:  Moderate cardiomegaly with hazy bilateral opacities. No pneumothorax or sizable pleural effusion.  IMPRESSION: 1. Radiographically appropriate position of endotracheal tube. 2. Cardiomegaly with bilateral hazy opacities which may indicate pulmonary edema or infection.  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 Personally Reviewed   Micro Data:  4/4 Blood  cultures>> 05/16/2018 sputum culture 05/16/2018 urine Legionella 05/16/2018 urine strep 4 06/12/2018 COVID-19 05/16/2018 RVP  Antimicrobials:   linezolid 4/5>4/6 Zosyn 4/5>>  Interim history/subjective:  Increased agitation overnight, eICU increased fentanyl gtt.  Doing well with PSV/CPAP this morning COVID-19 result negative  Objective   Blood pressure 134/71, pulse 77, temperature 97.9 F (36.6 C), resp. rate (!) 25, height 5\' 2"  (1.575 m), weight 79.6 kg, SpO2 100 %.    Vent Mode: PRVC FiO2 (%):  [60 %] 60 % Set Rate:  [25 bmp] 25 bmp Vt Set:  [390 mL] 390 mL PEEP:  [8 cmH20] 8 cmH20 Plateau Pressure:  [19 cmH20-24 cmH20] 19 cmH20   Intake/Output Summary (Last 24 hours) at 05/17/2018 0807 Last data filed at 05/17/2018 0600 Gross per 24 hour  Intake 1266.27 ml  Output 1000 ml  Net 266.27 ml      Filed Weights   05/16/18 0402 05/17/18 0359  Weight: 78.9 kg 79.6 kg    Examination:  General: AAO, following commands, NAD in bed  HEENT: NCAT, ETT OGT secure, pink mmm, anicteric sclera, trachea midline Neuro: PERRL, following commands, no focal deficits  CV: RRR s1s2 no r/g/m no JVD, 2+ pulses  PULM: Even, non-labored respirations, CTA bilaterally.  GI: soft round ndnt, normoactive x4  Extremities: Trace pedal edema bilaterally. Symmetrical bulk and tone, no obvious deformity Skin: Clean, dry, warm, intact no rash or abrasion    Resolved Hospital Problem list     Assessment & Plan:  Acute hypoxic respiratory failure that required intubation on 05/16/2018 at 0413 hrs. -Asthma, continued tobacco use -ESRD with a left  AV graft and on arm, iHD Monday Wednesdays and Fridays reportedly attended on Friday Note she has a separate medical record number from 1 be used P VAP prevention bundle Clinically this does not sound like COVID-19 but has been tested. COVID-19 not detected Solu-Medrol for bronchospasm Continue bronchodilators  Weaning on vent today, PSV/CPAP 40% Will Discuss  with PCCM attending-- wondering if, given clinical improvement, CXR improvement, doing well with vent wean, if patient can be extubated.  Patient does make some urine, can use Lasix  Will change fentanyl gtt to precedex gtt, with PRN fentanyl   End-stage renal disease with hemodialysis Monday Wednesdays and Fridays Patient refused trialysis catheter placement 4/5, however has stated 4/6 that she will consent for placement if we need to do CRRT.  P Nephrology following, appreciate recs  Per nephrology 4/6 patient does not need CRRT today.  (iHD can be executed when/if COVID-19 results negative) Patient does make urine, we can repeat lasix today today if needed.   Diabetes mellitus Sliding scale insulin protocol  Hypertension Hold home antihypertensives  -will be starting precedex gtt today in lieu of fentanyl gtt    Best practice:  Diet:NPO  Pain/Anxiety/Delirium protocol (if indicated): precedex  VAP protocol (if indicated): yes DVT prophylaxis: heparin GI prophylaxis: protonix Glucose control: ISS Mobility: bed bound Code Status: Full Code  Family Communication: pending. Daughter is Beatriz Chancellor [phone] Disposition: ICU   Labs   CBC: Recent Labs  Lab 05/16/18 0400 05/16/18 0533 05/16/18 0951 05/17/18 0344  WBC 11.2*  --   --   --   NEUTROABS 5.6  --   --   --   HGB 10.6* 10.5* 8.8* 12.9  HCT 34.1* 31.0* 26.0* 38.0  MCV 97.4  --   --   --   PLT 347  --   --   --     Basic Metabolic Panel: Recent Labs  Lab 05/16/18 0401 05/16/18 0533 05/16/18 0951 05/17/18 0344 05/17/18 0656  NA 130* 132* 131* 130* 130*  K 3.4* 3.2* 3.4* 3.6 3.9  CL 92*  --   --   --  93*  CO2 24  --   --   --  20*  GLUCOSE 142*  --   --   --  88  BUN 19  --   --   --  28*  CREATININE 5.79*  --   --   --  7.26*  CALCIUM 8.5*  --   --   --  8.3*  MG  --   --   --   --  2.1  PHOS  --   --   --   --  5.6*   GFR: Estimated Creatinine Clearance: 8.3 mL/min (A) (by C-G formula based on  SCr of 7.26 mg/dL (H)). Recent Labs  Lab 05/16/18 0400 05/16/18 0740  PROCALCITON 0.22  --   WBC 11.2*  --   LATICACIDVEN 1.6 1.2    Liver Function Tests: Recent Labs  Lab 05/16/18 0401 05/17/18 0656  AST 27 21  ALT 14 11  ALKPHOS 144* 100  BILITOT 0.4 0.5  PROT 7.4 5.9*  ALBUMIN 3.0* 2.4*   No results for input(s): LIPASE, AMYLASE in the last 168 hours. No results for input(s): AMMONIA in the last 168 hours.  ABG    Component Value Date/Time   PHART 7.454 (H) 05/17/2018 0344   PCO2ART 37.7 05/17/2018 0344   PO2ART 169.0 (H) 05/17/2018 0344   HCO3 26.5 05/17/2018 0344  TCO2 28 05/17/2018 0344   ACIDBASEDEF 3.6 (H) 05/16/2018 0411   O2SAT 100.0 05/17/2018 0344     Coagulation Profile: No results for input(s): INR, PROTIME in the last 168 hours.  Cardiac Enzymes: Recent Labs  Lab 05/16/18 0400  TROPONINI <0.03    HbA1C: No results found for: HGBA1C  CBG: Recent Labs  Lab 05/16/18 1137 05/16/18 1619 05/16/18 2010 05/17/18 0019 05/17/18 0803  GLUCAP 163* 169* 136* 153* 83      Critical care time: 35 minutes     Eliseo Gum MSN, AGACNP-BC [place] [phone] If no answer, [phone] 05/17/2018, 8:07 AM   PCCM Attending:  58 yo FM, AHRF, intubated on MV, ESRD h/o asthma  BP (!) 172/93   Pulse 84   Temp 98.2 F (36.8 C)   Resp 15   Ht 5\' 2"  (1.575 m)   Wt 79.6 kg   SpO2 95%   BMI 32.10 kg/m   Gen: intubated on vent, comfortable, seen while weaning, NAD  Lungs: vent reviewed, BL vented breaths  Cardiac: sinus on tele, regular   A:  AHRF Intubated on MV  ESRD DMII HTN  P: COVID19 negative  Transfer laterally to another ICU  Continue weaning Planned extubation later iHD per nephrology  We appreciate there rec   See note above   This patient is critically ill with multiple organ system failure; which, requires frequent high complexity decision making, assessment, support, evaluation, and  titration of therapies. This was completed through the application of advanced monitoring technologies and extensive interpretation of multiple databases. During this encounter critical care time was devoted to patient care services described in this note for 32 minutes.   Garner Nash, DO [facility] 05/17/2018 6:15 PM  Personal pager: [phone] If unanswered, please page CCM On-call: [phone]

## 2018-05-17 NOTE — Progress Notes (Signed)
  [facility] Progress Note   Assessment/ Plan:   1. Acute resp failure - in patient recently started on dialysis 6 wks ago , gets HD in [facility].  Bilat pulm infiltrates, not sure infectious or non-infectious (edema).  On exam is euvolemic per notes.  COVID-19 testing pending.  On linezolid, solumedrol, Zosyn.  2.  ESRD on HD MWF - started 6 wks ago.  Discussed with Dr. Valeta Harms this AM- we will await results of COVID-19 testing as long as we can before committing to CRRT or IHD to conserve resources and protect staff.  There is no urgent or emergent need for dialysis currently and she has not missed any sessions.  High-dose Lasix since she still makes some urine.      3. DM2 on insulin  4. HTN - on 4 bp meds and still takes some lasix. She was given IV lasix high dose 1 time today, results pending.   5. H/o gout  6. H/o HL  Subjective:    Remains intubated.  Declined placement of nontunneled HD catheter (please see pt's handwritten note in PCCM documentation).  SBT this AM.  COVID-19 testing pending.   Objective:   BP 134/71   Pulse 82   Temp 98.1 F (36.7 C)   Resp 20   Ht _0  (1.575 m)   Wt 79.6 kg   SpO2 100%   BMI 32.10 kg/m   Physical Exam: Gen: intubated, sedated, NAD Remainder of physical exam not possible d/t COVID-19 ruleout and I am relying on physical exam perofrmed by Drs Mariane Masters and Valeta Harms and Richardson Landry Minor NP.  Labs: BMET Recent Labs  Lab 05/16/18 0401 05/16/18 0533 05/16/18 0951 05/17/18 0344 05/17/18 0656  NA 130* 132* 131* 130* 130*  K 3.4* 3.2* 3.4* 3.6 3.9  CL 92*  --   --   --  93*  CO2 24  --   --   --  20*  GLUCOSE 142*  --   --   --  88  BUN 19  --   --   --  28*  CREATININE 5.79*  --   --   --  7.26*  CALCIUM 8.5*  --   --   --  8.3*  PHOS  --   --   --   --  5.6*   CBC Recent Labs  Lab 05/16/18 0400 05/16/18 0533 05/16/18 0951 05/17/18 0344  WBC 11.2*  --   --   --   NEUTROABS 5.6  --   --   --   HGB  10.6* 10.5* 8.8* 12.9  HCT 34.1* 31.0* 26.0* 38.0  MCV 97.4  --   --   --   PLT 347  --   --   --     _1 @ Medications:    . albuterol  2.5 mg Nebulization Q6H  . chlorhexidine gluconate (MEDLINE KIT)  15 mL Mouth Rinse BID  . enoxaparin (LOVENOX) injection  30 mg Subcutaneous Q24H  . insulin aspart  0-15 Units Subcutaneous Q4H  . mouth rinse  15 mL Mouth Rinse 10 times per day  . methylPREDNISolone (SOLU-MEDROL) injection  60 mg Intravenous Daily  . pantoprazole sodium  40 mg Per Tube Daily     Madelon Lips, MD [place] pgr [phone] 05/17/2018, 9:10 AM

## 2018-05-17 NOTE — Progress Notes (Signed)
[place] Progress Note Patient Name: MYLENE BOW DOB: [birth year] MRN: [id number]   Date of Service  05/17/2018  HPI/Events of Note  Notified by bedside ICU RN that pt was requiring higher doses of fentanyl.    eICU Interventions  Increase the max dose of fentanyl drip to 344mcg/hr.      Intervention Category Minor Interventions: Agitation / anxiety - evaluation and management  Elsie Lincoln 05/17/2018, 2:09 AM

## 2018-05-17 NOTE — Progress Notes (Addendum)
Initial Nutrition Assessment RD working remotely.  DOCUMENTATION CODES:   Obesity unspecified  INTERVENTION:    Vital AF 1.2 at 30 ml/h (720 ml per day)  Pro-stat 30 ml TID   Provides 1164 kcal, 99 gm protein, 523 ml free water daily  NUTRITION DIAGNOSIS:   Inadequate oral intake related to inability to eat as evidenced by NPO status.  GOAL:   Provide needs based on ASPEN/SCCM guidelines  MONITOR:   Vent status, TF tolerance, Labs, I & O's, Weight trends  REASON FOR ASSESSMENT:   Ventilator, Consult Enteral/tube feeding initiation and management  ASSESSMENT:   58 yo female with PMH of DM, asthma, ESRD on HD, and HTN who was admitted with sudden onset SOB. Intubated 4/4. COVID-19 negative.   Received MD Consult for TF initiation and management. OGT in place.  Patient is currently intubated on ventilator support MV: 4.3 L/min Temp (24hrs), Avg:98.4 F (36.9 C), Min:97.5 F (36.4 C), Max:99.9 F (37.7 C)   Labs reviewed. Sodium 130 (L), BUN 28 (H), creatinine 7.26 (H), phosphorus 5.6 (H) Medications reviewed and include Novolog, Solumedrol, Precedex.  Generalized edema to BUE per RN documentation. Unsure of EDW.  NUTRITION - FOCUSED PHYSICAL EXAM:  unable to complete  Diet Order:   Diet Order            Diet NPO time specified  Diet effective now              EDUCATION NEEDS:   No education needs have been identified at this time  Skin:  Skin Assessment: Reviewed RN Assessment  Last BM:  none documented since admission  Height:   Ht Readings from Last 1 Encounters:  05/16/18 5\' 2"  (1.575 m)    Weight:   Wt Readings from Last 1 Encounters:  05/17/18 79.6 kg    Ideal Body Weight:  50 kg  BMI:  Body mass index is 32.1 kg/m.  Estimated Nutritional Needs:   Kcal:  [phone]  Protein:  100 gm  Fluid:  1 L     Molli Barrows, RD, LDN, [place] Pager [phone] After Hours Pager [phone]

## 2018-05-17 NOTE — Progress Notes (Signed)
Patient had 900cc green colored output through OG tube within the last 24 hours. ELink notified. Will continue to monitor.

## 2018-05-17 NOTE — Procedures (Signed)
Extubation Procedure Note  Patient Details:   Name: Jodi Bell DOB: 1960-04-06 MRN: [id number]   Airway Documentation:    Vent end date: 05/17/18 Vent end time: 1545   Evaluation  O2 sats: stable throughout Complications: No apparent complications Patient did tolerate procedure well. Bilateral Breath Sounds: Inspiratory wheezes, Expiratory wheezes   Yes.   Pt extubated per physician order. Pt placed on 3L [place]. No stridor heard, good strong cough and pt able to speak name. IS started with pt. RRT will continue to monitor.   Sharla Kidney 05/17/2018, 3:49 PM

## 2018-05-18 ENCOUNTER — Inpatient Hospital Stay (HOSPITAL_COMMUNITY): Payer: Medicare PPO

## 2018-05-18 LAB — BASIC METABOLIC PANEL
Anion gap: 15 (ref 5–15)
BUN: 35 mg/dL — ABNORMAL HIGH (ref 6–20)
CO2: 26 mmol/L (ref 22–32)
Calcium: 7.8 mg/dL — ABNORMAL LOW (ref 8.9–10.3)
Chloride: 91 mmol/L — ABNORMAL LOW (ref 98–111)
Creatinine, Ser: 8.44 mg/dL — ABNORMAL HIGH (ref 0.44–1.00)
GFR calc Af Amer: 5 mL/min — ABNORMAL LOW (ref >60)
GFR calc non Af Amer: 5 mL/min — ABNORMAL LOW (ref >60)
Glucose, Bld: 142 mg/dL — ABNORMAL HIGH (ref 70–99)
Potassium: 4.1 mmol/L (ref 3.5–5.1)
Sodium: 132 mmol/L — ABNORMAL LOW (ref 135–145)

## 2018-05-18 LAB — CBC
HCT: 28.1 % — ABNORMAL LOW (ref 36.0–46.0)
Hemoglobin: 8.7 g/dL — ABNORMAL LOW (ref 12.0–15.0)
MCH: 29.3 pg (ref 26.0–34.0)
MCHC: 31 g/dL (ref 30.0–36.0)
MCV: 94.6 fL (ref 80.0–100.0)
Platelets: 350 10*3/uL (ref 150–400)
RBC: 2.97 MIL/uL — ABNORMAL LOW (ref 3.87–5.11)
RDW: 16 % — ABNORMAL HIGH (ref 11.5–15.5)
WBC: 9.7 10*3/uL (ref 4.0–10.5)
nRBC: 0 % (ref 0.0–0.2)

## 2018-05-18 LAB — CULTURE, RESPIRATORY W GRAM STAIN
Culture: NORMAL
Special Requests: NORMAL

## 2018-05-18 LAB — GLUCOSE, CAPILLARY
Glucose-Capillary: 107 mg/dL — ABNORMAL HIGH (ref 70–99)
Glucose-Capillary: 119 mg/dL — ABNORMAL HIGH (ref 70–99)
Glucose-Capillary: 132 mg/dL — ABNORMAL HIGH (ref 70–99)
Glucose-Capillary: 146 mg/dL — ABNORMAL HIGH (ref 70–99)
Glucose-Capillary: 157 mg/dL — ABNORMAL HIGH (ref 70–99)
Glucose-Capillary: 168 mg/dL — ABNORMAL HIGH (ref 70–99)

## 2018-05-18 LAB — HEPATITIS B SURFACE ANTIGEN: Hepatitis B Surface Ag: NEGATIVE

## 2018-05-18 MED ORDER — ALTEPLASE 2 MG IJ SOLR
2.0000 mg | Freq: Once | INTRAMUSCULAR | Status: DC | PRN
  Filled 2018-05-18: qty 2

## 2018-05-18 MED ORDER — SODIUM CHLORIDE 0.9 % IV SOLN: 100.0000 mL | INTRAVENOUS | Status: DC | PRN

## 2018-05-18 MED ORDER — HEPARIN SODIUM (PORCINE) 1000 UNIT/ML DIALYSIS
1000.0000 [IU] | INTRAMUSCULAR | Status: DC | PRN
  Filled 2018-05-18: qty 1

## 2018-05-18 MED ORDER — LIDOCAINE-PRILOCAINE 2.5-2.5 % EX CREA: 1.0000 "application " | TOPICAL_CREAM | CUTANEOUS | Status: DC | PRN

## 2018-05-18 MED ORDER — LIDOCAINE HCL (PF) 1 % IJ SOLN
5.0000 mL | INTRAMUSCULAR | Status: DC | PRN
  Filled 2018-05-18: qty 5

## 2018-05-18 MED ORDER — PENTAFLUOROPROP-TETRAFLUOROETH EX AERO: 1.0000 "application " | INHALATION_SPRAY | CUTANEOUS | Status: DC | PRN

## 2018-05-18 MED ORDER — GUAIFENESIN ER 600 MG PO TB12
600.0000 mg | ORAL_TABLET | Freq: Two times a day (BID) | ORAL | Status: DC
  Administered 2018-05-18 – 2018-05-20 (×6): 600 mg via ORAL
  Filled 2018-05-18 (×6): qty 1

## 2018-05-18 MED ORDER — CHLORHEXIDINE GLUCONATE CLOTH 2 % EX PADS
6.0000 | MEDICATED_PAD | Freq: Every day | CUTANEOUS | Status: DC
  Administered 2018-05-19 – 2018-05-20 (×2): 6 via TOPICAL

## 2018-05-18 NOTE — Progress Notes (Signed)
[place] Progress Note Patient Name: Jodi Bell DOB: [birth year] MRN: [id number]   Date of Service  05/18/2018  HPI/Events of Note  Patient with respiratory secretions which she is having difficulty mobilizing. Request for Mucinex.   eICU Interventions  Will order Mucinex.      Intervention Category Major Interventions: Other:  [name],[name] 05/18/2018, 1:24 AM

## 2018-05-18 NOTE — Progress Notes (Addendum)
NAME:  Jodi Bell, MRN:  [id number], DOB:  05-Mar-1960, LOS: 2 ADMISSION DATE:  05/16/2018, CONSULTATION DATE:  05/16/18 REFERRING MD:  Chryl Heck, CHIEF COMPLAINT:  Shortness of breath  Brief History   58 year old woman with history of asthma, ESRD on dialysis MWF with sudden onset shortness of breath since yesterday, 4/4 am.    History of present illness   Dyspnea at rest since yesterday am (4/4).  Lives with daughter.  Apparently was unable to get her nebulizer treatment done on her own, which is unusual for her.  Patient called her to come assist her daughter this evening, found her to be very distressed, short of breath and unable to complete sentences, so EMS was called.  She was given duoneb and solumedrol in route.   She was intubated in the er for severe respiratory distress, tachypnea, confusion. Initial sat "20%", improved to 90 with NRB but remained tachypneic and altered.  Per daughter, no sick contacts.   Last dialysis Friday as scheduled with no problems.    She was hypertensive at the time of intubation, which improved with sedation and intubation.  Started on propofol infusion.  2020 propofol discontinued due to hypotension   Past Medical History  Asthma (has nebulizer at home) ESRD - Nephrologist is Dr. Quincy Simmonds HTN  [facility] Events   Intubated 4/4 4/5> refused placement of trialysis catheter  Consults:  05/16/2018 renal>>  Procedures:  Intubated 4/4>> 4/6  Significant Diagnostic Tests:   CXR 05/16/18:  Moderate cardiomegaly with hazy bilateral opacities. No pneumothorax or sizable pleural effusion.  IMPRESSION: 1. Radiographically appropriate position of endotracheal tube. 2. Cardiomegaly with bilateral hazy opacities which may indicate pulmonary edema or infection.  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 Personally Reviewed CXR 4/7>>  Improved aeration  is noted with tiny left pleural effusion  Micro Data:  4/4 Blood cultures>> 05/16/2018 sputum culture 05/16/2018 urine Legionella 05/16/2018 urine strep 4 06/12/2018 COVID-19 05/16/2018 RVP  Antimicrobials:   linezolid 4/5>4/6 Zosyn 4/5>>  Interim history/subjective:  Extubated 4/6 COVID-19 result negative  Objective   Blood pressure (!) 149/75, pulse 90, temperature 98.1 F (36.7 C), temperature source Oral, resp. rate 16, height 5\' 2"  (1.575 m), weight 80.8 kg, SpO2 99 %.    Vent Mode: PRVC FiO2 (%):  [40 %] 40 % Set Rate:  [25 bmp] 25 bmp Vt Set:  [390 mL] 390 mL PEEP:  [8 cmH20] 8 cmH20 Plateau Pressure:  [29 cmH20] 29 cmH20   Intake/Output Summary (Last 24 hours) at 05/18/2018 1024 Last data filed at 05/18/2018 0900 Gross per 24 hour  Intake 408.16 ml  Output 50 ml  Net 358.16 ml      Filed Weights   05/16/18 0402 05/17/18 0359 05/18/18 0500  Weight: 78.9 kg 79.6 kg 80.8 kg    Examination:  General: AAO, following commands, OOB in chair HEENT: NCAT, ETT OGT secure, pink mmm, anicteric sclera, trachea midline Neuro: Awake and alert, following commands, MAE x 4, tremor CV:s1s2, RRR,  no r/g/m no JVD, 2+ pulses  PULM: Bilateral chest excursion, Even, non-labored respirations, CTA bilaterally, diminished per bases.  GI: soft , ND, NT, BS + Extremities: Trace pedal edema bilaterally. Symmetrical bulk and tone, no obvious deformity Skin: Clean, dry, warm, intact no rash or abrasion    Resolved Hospital Problem list     Assessment & Plan:  Acute hypoxic respiratory failure that required intubation  on 05/16/2018 at 0413 hrs. -Asthma, continued tobacco use -ESRD with a left AV graft and on arm, iHD Monday Wednesdays and Fridays reportedly attended on Friday Extubated 4/6 Now on 1 L [place] COVID-19 not detected P Aggressive Pulmonary Toilet OOB to chair PT/OT IS Continue Solu-Medrol for bronchospasm Continue bronchodilators  Titrate oxygen for Saturation goal of  > 92%  Transfer to med surg floor and [facility] as primary Patient does make some urine, can use Lasix    End-stage renal disease with hemodialysis Monday Wednesdays and Fridays For HD 4/7 CRRT not needed Tremors  P Nephrology following, appreciate recs  HD planned 4/7 Hopeful HD will resolve tremors Patient does make urine, we can repeat lasix  As needed   Diabetes mellitus Sliding scale insulin protocol CBG's Q 4  Hypertension Home Norvasc and Catapress resumed 4/6 Prn apresoline for SBP > 185 systolic  Will transfer to regular medical bed and [facility].  Best practice:  Diet:NPO  Pain/Anxiety/Delirium protocol (if indicated): precedex  VAP protocol (if indicated): yes DVT prophylaxis: heparin GI prophylaxis: protonix Glucose control: ISS Mobility: bed bound Code Status: Full Code  Family Communication: pending. Daughter is Beatriz Chancellor [phone] Disposition: Transfer to med surg   Labs   CBC: Recent Labs  Lab 05/16/18 0400 05/16/18 0533 05/16/18 0951 05/17/18 0344 05/17/18 0940  WBC 11.2*  --   --   --  7.6  NEUTROABS 5.6  --   --   --  6.2  HGB 10.6* 10.5* 8.8* 12.9 8.5*  HCT 34.1* 31.0* 26.0* 38.0 25.8*  MCV 97.4  --   --   --  91.8  PLT 347  --   --   --  785    Basic Metabolic Panel: Recent Labs  Lab 05/16/18 0401 05/16/18 0533 05/16/18 0951 05/17/18 0344 05/17/18 0656 05/18/18 0307  NA 130* 132* 131* 130* 130* 132*  K 3.4* 3.2* 3.4* 3.6 3.9 4.1  CL 92*  --   --   --  93* 91*  CO2 24  --   --   --  20* 26  GLUCOSE 142*  --   --   --  88 142*  BUN 19  --   --   --  28* 35*  CREATININE 5.79*  --   --   --  7.26* 8.44*  CALCIUM 8.5*  --   --   --  8.3* 7.8*  MG  --   --   --   --  2.1  --   PHOS  --   --   --   --  5.6*  --    GFR: Estimated Creatinine Clearance: 7.2 mL/min (A) (by C-G formula based on SCr of 8.44 mg/dL (H)). Recent Labs  Lab 05/16/18 0400 05/16/18 0740 05/17/18 0940  PROCALCITON 0.22  --   --   WBC 11.2*  --  7.6  LATICACIDVEN  1.6 1.2  --     Liver Function Tests: Recent Labs  Lab 05/16/18 0401 05/17/18 0656  AST 27 21  ALT 14 11  ALKPHOS 144* 100  BILITOT 0.4 0.5  PROT 7.4 5.9*  ALBUMIN 3.0* 2.4*   No results for input(s): LIPASE, AMYLASE in the last 168 hours. No results for input(s): AMMONIA in the last 168 hours.  ABG    Component Value Date/Time   PHART 7.454 (H) 05/17/2018 0344   PCO2ART 37.7 05/17/2018 0344   PO2ART 169.0 (H) 05/17/2018 0344   HCO3 26.5 05/17/2018 0344  TCO2 28 05/17/2018 0344   ACIDBASEDEF 3.6 (H) 05/16/2018 0411   O2SAT 100.0 05/17/2018 0344     Coagulation Profile: No results for input(s): INR, PROTIME in the last 168 hours.  Cardiac Enzymes: Recent Labs  Lab 05/16/18 0400  TROPONINI <0.03    HbA1C: No results found for: HGBA1C  CBG: Recent Labs  Lab 05/17/18 1549 05/17/18 1959 05/18/18 0008 05/18/18 0413 05/18/18 0825  GLUCAP 141* 176* 107* 157* 119*      APP time 30 minutes      Magdalen Spatz, AGACNP-BC  Pulmonary Critical Care 05/18/2018 10:24 AM  Personal pager: [phone] If unanswered, please page CCM On-call: [phone]

## 2018-05-18 NOTE — Evaluation (Addendum)
Occupational Therapy Evaluation Patient Details Name: Jodi [facility] MRN: [id number] DOB: 1960/03/20 Today's Date: 05/18/2018    History of Present Illness Pt is a 58 y/o female with a PMH significant for asthma, ESRD on HD MWF,  who presents with SOB at rest. Pt arrived via EMS and was intubated in the ED for severe respiratory distress, tachynea, confusion. Intubated 4/4-4/6.   Clinical Impression   This 58 y/o female presents with the above. PTA pt reports she is mod independent with ADL and functional mobility using SPC. Pt presents supine in bed pleasant and willing to participate in therapy session. Pt requiring close minguard-light minA for functional transfers; she currently requires minguard assist for seated UB ADL, minA for LB ADL. Pt with bil UE tremors which she reports she has intermittently at baseline. Pt fatigued with activity but feel she will progress well. Pt trialed on RA during session with O2 sats briefly decreasing to mid-high 80s, returning to >92% with seated rest and cues for deep breathing, and reapplied 1L O2 end of session. She will benefit from continued acute OT services and recommend follow up [facility] therapy services after discharge to maximize her safety and independence with ADL and mobility. Will follow.     Follow Up Recommendations  Home health OT;Supervision/Assistance - 24 hour    Equipment Recommendations  None recommended by OT           Precautions / Restrictions Precautions Precautions: Fall Precaution Comments: Watch O2 sats Restrictions Weight Bearing Restrictions: No      Mobility Bed Mobility Overal bed mobility: Needs Assistance Bed Mobility: Supine to Sit     Supine to sit: Supervision     General bed mobility comments: Supervision for safety and management of lines. Pt was able to transition to EOB without assistance, however required cues to scoot out fully and get feet on the floor.   Transfers Overall transfer level: Needs  assistance Equipment used: None Transfers: Sit to/from Stand Sit to Stand: Min guard         General transfer comment: Close guard for safety as pt powered up to full stand. Knees mildly buckly due to tremors but no LOB noted.     Balance Overall balance assessment: Needs assistance Sitting-balance support: Feet supported;No upper extremity supported Sitting balance-Leahy Scale: Fair     Standing balance support: Single extremity supported;During functional activity Standing balance-Leahy Scale: Fair                             ADL either performed or assessed with clinical judgement   ADL Overall ADL's : Needs assistance/impaired Eating/Feeding: Set up;Sitting   Grooming: Wash/dry face;Set up;Sitting   Upper Body Bathing: Set up;Min guard;Sitting   Lower Body Bathing: Minimal assistance;Sit to/from stand   Upper Body Dressing : Set up;Min guard;Sitting   Lower Body Dressing: Minimal assistance;Sit to/from stand Lower Body Dressing Details (indicate cue type and reason): pt donning socks via figure 4 technique at bed level with HOB elevated; minguard-light minA for standing balance Toilet Transfer: Min guard;Minimal assistance;Stand-pivot Toilet Transfer Details (indicate cue type and reason): simulated via transfer to [place] and Hygiene: Minimal assistance;Sit to/from stand       Functional mobility during ADLs: Min guard;Minimal assistance General ADL Comments: pt with bil UE tremors, generalized weakness and decreased endurance  Pertinent Vitals/Pain Pain Assessment: No/denies pain     Hand Dominance Right   Extremity/Trunk Assessment Upper Extremity Assessment Upper Extremity Assessment: Generalized weakness;RUE deficits/detail;LUE deficits/detail RUE Deficits / Details: bil UE tremors, more notable at rest vs with intentional movement, pt reports she has these intermittently;  reports numbness in fingers (neuropathy) LUE Deficits / Details: bil UE tremors, more notable at rest vs with intentional movement, pt reports she has these intermittently; reports numbness in fingers (neuropathy)   Lower Extremity Assessment Lower Extremity Assessment: Defer to PT evaluation RLE Deficits / Details: Bilaterally, strength 4+/5 to 5/5, however noted tremors at rest and during functional mobility.  RLE Sensation: WNL LLE Sensation: WNL   Cervical / Trunk Assessment Cervical / Trunk Assessment: Normal   Communication Communication Communication: No difficulties   Cognition Arousal/Alertness: Awake/alert Behavior During Therapy: WFL for tasks assessed/performed Overall Cognitive Status: Within Functional Limits for tasks assessed                                     General Comments  Pt trialed on RA with SpO2 briefly down to mid-upper 80's, returned to >92% with seated rest and cues for deep breathing; pt placed on 1L O2 end of session    Exercises     Shoulder Instructions      Home Living Family/patient expects to be discharged to:: Private residence Living Arrangements: Children Available Help at Discharge: Family;Available 24 hours/day Type of Home: House Home Access: Level entry     Home Layout: Multi-level Alternate Level Stairs-Number of Steps: 10 stairs to get up to kitchen Alternate Level Stairs-Rails: Left Bathroom Shower/Tub: Teacher, early years/pre: Standard     Home Equipment: Environmental consultant - 2 wheels;Cane - single point;Bedside commode          Prior Functioning/Environment Level of Independence: Independent with assistive device(s)        Comments: Utilized SPC mostly         OT Problem List: Decreased strength;Decreased range of motion;Decreased activity tolerance;Impaired balance (sitting and/or standing);Cardiopulmonary status limiting activity;Impaired UE functional use      OT Treatment/Interventions:  Self-care/ADL training;Therapeutic exercise;Neuromuscular education;DME and/or AE instruction;Therapeutic activities;Patient/family education;Balance training    OT Goals(Current goals can be found in the care plan section) Acute Rehab OT Goals Patient Stated Goal: Return home at d/c; get rid of the tremors OT Goal Formulation: With patient Time For Goal Achievement: 06/01/18 Potential to Achieve Goals: Good  OT Frequency: Min 2X/week   Barriers to D/C:            Co-evaluation PT/OT/SLP Co-Evaluation/Treatment: Yes Reason for Co-Treatment: To address functional/ADL transfers;For patient/therapist safety PT goals addressed during session: Mobility/safety with mobility;Balance OT goals addressed during session: ADL's and self-care      AM-PAC OT "6 Clicks" Daily Activity     Outcome Measure Help from another person eating meals?: None Help from another person taking care of personal grooming?: A Little Help from another person toileting, which includes using toliet, bedpan, or urinal?: A Little Help from another person bathing (including washing, rinsing, drying)?: A Little Help from another person to put on and taking off regular upper body clothing?: None Help from another person to put on and taking off regular lower body clothing?: A Little 6 Click Score: 20   End of Session Equipment Utilized During Treatment: Oxygen Nurse Communication: Mobility status  Activity Tolerance: Patient tolerated treatment well  Patient left: in chair;with call bell/phone within reach;with nursing/sitter in room  OT Visit Diagnosis: Muscle weakness (generalized) (M62.81);Unsteadiness on feet (R26.81)                Time: 1991-4445 OT Time Calculation (min): 25 min Charges:  OT General Charges $OT Visit: 1 Visit OT Evaluation $OT Eval Moderate Complexity: [place], OT [employer] Pager [phone] Office [phone]   Raymondo Band 05/18/2018, 10:56 AM

## 2018-05-18 NOTE — Evaluation (Signed)
Physical Therapy Evaluation Patient Details Name: Jodi Bell MRN: [id number] DOB: 1960-08-23 Today's Date: 05/18/2018   History of Present Illness  Pt is a 58 y/o female with a PMH significant for asthma, ESRD on HD MWF,  who presents with SOB at rest. Pt arrived via EMS and was intubated in the ED for severe respiratory distress, tachynea, confusion. Intubated 4/4-4/6.  Clinical Impression  Pt admitted with above diagnosis. Pt currently with functional limitations due to the deficits listed below (see PT Problem List). At the time of PT eval pt was able to perform transfers and minimal ambulation with gross min guard assist and therapist management of lines. Pt on RA for a portion of session and sats dropped as low as 86% (waveform questionable). Pt left on 1L/min supplemental O2 (sats 100%) at end of session and RN notified. Pt will benefit from skilled PT to increase their independence and safety with mobility to allow discharge to the venue listed below.       Follow Up Recommendations Home health PT;Supervision for mobility/OOB    Equipment Recommendations  None recommended by PT    Recommendations for Other Services       Precautions / Restrictions Precautions Precautions: Fall Precaution Comments: Watch O2 sats Restrictions Weight Bearing Restrictions: No      Mobility  Bed Mobility Overal bed mobility: Needs Assistance Bed Mobility: Supine to Sit     Supine to sit: Supervision     General bed mobility comments: Supervision for safety and management of lines. Pt was able to transition to EOB without assistance, however required cues to scoot out fully and get feet on the floor.   Transfers Overall transfer level: Needs assistance Equipment used: None Transfers: Sit to/from Stand Sit to Stand: Min guard         General transfer comment: Close guard for safety as pt powered up to full stand. Knees mildly buckly due to tremors but no LOB noted.    Ambulation/Gait Ambulation/Gait assistance: Min guard Gait Distance (Feet): 5 Feet Assistive device: 1 person hand held assist Gait Pattern/deviations: Step-through pattern;Decreased stride length;Trunk flexed;Narrow base of support Gait velocity: Decreased Gait velocity interpretation: <1.31 ft/sec, indicative of household ambulator General Gait Details: Pt took a few guarded steps from bed to recliner. Continued to demonstrate tremors and required hands on guarding throughout.   Stairs            Wheelchair Mobility    Modified Rankin (Stroke Patients Only)       Balance Overall balance assessment: Needs assistance Sitting-balance support: Feet supported;No upper extremity supported Sitting balance-Leahy Scale: Fair     Standing balance support: Single extremity supported;During functional activity Standing balance-Leahy Scale: Fair                               Pertinent Vitals/Pain Pain Assessment: No/denies pain    Home Living Family/patient expects to be discharged to:: Private residence Living Arrangements: Children Available Help at Discharge: Family;Available 24 hours/day Type of Home: House Home Access: Level entry     Home Layout: Multi-level Home Equipment: Walker - 2 wheels;Cane - single point;Bedside commode      Prior Function Level of Independence: Independent with assistive device(s)         Comments: Utilized SPC mostly      Hand Dominance        Extremity/Trunk Assessment   Upper Extremity Assessment Upper Extremity Assessment: Defer to  OT evaluation    Lower Extremity Assessment Lower Extremity Assessment: RLE deficits/detail;LLE deficits/detail RLE Deficits / Details: Bilaterally, strength 4+/5 to 5/5, however noted tremors at rest and during functional mobility.  RLE Sensation: WNL LLE Sensation: WNL    Cervical / Trunk Assessment Cervical / Trunk Assessment: Normal  Communication   Communication: No  difficulties  Cognition Arousal/Alertness: Awake/alert Behavior During Therapy: WFL for tasks assessed/performed Overall Cognitive Status: Within Functional Limits for tasks assessed                                        General Comments      Exercises     Assessment/Plan    PT Assessment Patient needs continued PT services  PT Problem List Decreased strength;Decreased activity tolerance;Decreased balance;Decreased mobility;Decreased coordination;Decreased knowledge of use of DME;Decreased safety awareness;Decreased knowledge of precautions;Cardiopulmonary status limiting activity       PT Treatment Interventions DME instruction;Gait training;Stair training;Functional mobility training;Therapeutic activities;Therapeutic exercise;Neuromuscular re-education;Patient/family education    PT Goals (Current goals can be found in the Care Plan section)  Acute Rehab PT Goals Patient Stated Goal: Return home at d/c; get rid of the tremors PT Goal Formulation: With patient Time For Goal Achievement: 05/25/18 Potential to Achieve Goals: Good    Frequency Min 3X/week   Barriers to discharge        Co-evaluation PT/OT/SLP Co-Evaluation/Treatment: Yes Reason for Co-Treatment: For patient/therapist safety;To address functional/ADL transfers PT goals addressed during session: Mobility/safety with mobility;Balance         AM-PAC PT "6 Clicks" Mobility  Outcome Measure Help needed turning from your back to your side while in a flat bed without using bedrails?: None Help needed moving from lying on your back to sitting on the side of a flat bed without using bedrails?: A Little Help needed moving to and from a bed to a chair (including a wheelchair)?: A Little Help needed standing up from a chair using your arms (e.g., wheelchair or bedside chair)?: A Little Help needed to walk in hospital room?: A Little Help needed climbing 3-5 steps with a railing? : A Little 6  Click Score: 19    End of Session Equipment Utilized During Treatment: Oxygen Activity Tolerance: Patient tolerated treatment well Patient left: in chair;with call bell/phone within reach Nurse Communication: Mobility status PT Visit Diagnosis: Unsteadiness on feet (R26.81);Difficulty in walking, not elsewhere classified (R26.2)    Time: 0347-4259 PT Time Calculation (min) (ACUTE ONLY): 24 min   Charges:   PT Evaluation $PT Eval Moderate Complexity: 1 Mod          Rolinda Roan, PT, DPT Acute Rehabilitation Services Pager: [phone] Office: [phone]   Thelma Comp 05/18/2018, 9:34 AM

## 2018-05-18 NOTE — Progress Notes (Signed)
SLP Cancellation Note  Patient Details Name: Jodi Bell MRN: [id number] DOB: 1960/10/31   Cancelled treatment:       Reason Eval/Treat Not Completed: Other (comment) Per discussion RN, diet was started post-extubation and SLP swallow eval no longer wanted. Will defer evaluation at this time, but please reorder if needed.   [name] 05/18/2018, 10:23 AM  [name], M.A. [place] Acute Environmental education officer [phone] Office [phone]

## 2018-05-18 NOTE — Progress Notes (Signed)
Jodi Bell [id number]  Code Status: FULL  Admission Data: 05/18/2018 7:38 PM  Attending Provider: Duwayne Heck  DDU:[id number], [facility]  Consults/ Treatment Team: Treatment Team:  Roney Jaffe, MD  [name] is a 58 y.o. female patient admitted from ED awake, alert - oriented X 4 - no acute distress noted. VSS - Blood pressure (!) 180/81, pulse 96, temperature 98.7 F (37.1 C), temperature source Oral, resp. rate 18, height 5\' 2"  (1.575 m), weight 75.9 kg, SpO2 96 %. no c/o shortness of breath, no c/o chest pain. packet given to patient/family. Admission INP armband ID verified with patient/family, and in place.  SR up x 2, fall assessment complete, with patient and family able to verbalize understanding of risk associated with falls, and verbalized understanding to call nsg before up out of bed. Call light within reach, patient able to voice, and demonstrate understanding. Skin, clean-dry- intact without evidence of bruising, or skin tears.  No evidence of skin break down noted on exam.  ?  Will cont to eval and treat per MD orders.  [name], RN  05/18/2018 7:38 PM

## 2018-05-18 NOTE — Progress Notes (Signed)
[facility] Progress Note   Assessment/ Plan:   1. Acute resp failure - in patient recently started on dialysis 6 wks ago , gets HD in [facility] w/ [place] group.  Bilat pulm infiltrates, RVP and COVID-19 negative, cultures negative so far, antibiotics narrowed to Zosyn per primary.  Extubated  2.  ESRD on HD MWF - started 6 wks ago.  Will provide HD off schedule today and then back normal MWF schedule.    3. DM2 on insulin  4. HTN - on 4 bp meds and still takes some lasix. She was given IV lasix high dose 1 time today, results pending.   5. H/o gout  6. H/o HL  7.  Tremor: Possibly d/t combination of solumedrol, missed HD, per pt gabapentin has this effect on her but hasn't had any here so anything would be PTA  Would recommend her decreasing gabapentin to 100 mg QHS--> pt reports she takes TID.    Subjective:    COVID-19 negative.  Extubated yesterday.  Reports having tremors.   Objective:   BP (!) 156/72   Pulse 90   Temp 98.1 F (36.7 C) (Oral)   Resp (!) 22   Ht 5\' 2"  (1.575 m)   Wt 80.8 kg   SpO2 97%   BMI 32.58 kg/m   Physical Exam: GEN older woman, NAD, sitting in chair HEENT EOMI PERRL NECK + JVD PULM bilateral faint wheezes CV RRR no m/rr/g ABD soft nontender NABS EXT 1+ LE edema NEURO AAO x 3, coarse tremor bilaterally  SKIN no rashes or lesions ACCESS: LUE AVF + T/B  Labs: BMET Recent Labs  Lab 05/16/18 0401 05/16/18 0533 05/16/18 0951 05/17/18 0344 05/17/18 0656 05/18/18 0307  NA 130* 132* 131* 130* 130* 132*  K 3.4* 3.2* 3.4* 3.6 3.9 4.1  CL 92*  --   --   --  93* 91*  CO2 24  --   --   --  20* 26  GLUCOSE 142*  --   --   --  88 142*  BUN 19  --   --   --  28* 35*  CREATININE 5.79*  --   --   --  7.26* 8.44*  CALCIUM 8.5*  --   --   --  8.3* 7.8*  PHOS  --   --   --   --  5.6*  --    CBC Recent Labs  Lab 05/16/18 0400 05/16/18 0533 05/16/18 0951 05/17/18 0344 05/17/18 0940  WBC 11.2*  --   --   --  7.6  NEUTROABS  5.6  --   --   --  6.2  HGB 10.6* 10.5* 8.8* 12.9 8.5*  HCT 34.1* 31.0* 26.0* 38.0 25.8*  MCV 97.4  --   --   --  91.8  PLT 347  --   --   --  233    @IMGRELPRIORS @ Medications:    . albuterol  2 puff Inhalation Q6H  . amLODipine  10 mg Oral Daily  . Chlorhexidine Gluconate Cloth  6 each Topical Q0600  . cloNIDine  0.2 mg Oral BID  . feeding supplement (PRO-STAT SUGAR FREE 64)  30 mL Per Tube TID  . guaiFENesin  600 mg Oral BID  . heparin injection (subcutaneous)  5,000 Units Subcutaneous Q8H  . insulin aspart  0-15 Units Subcutaneous Q4H  . mouth rinse  15 mL Mouth Rinse BID  . methylPREDNISolone (SOLU-MEDROL) injection  60 mg Intravenous Daily  .  pantoprazole sodium  40 mg Per Tube Daily     Madelon Lips, MD [place] pgr [phone] 05/18/2018, 9:56 AM

## 2018-05-19 ENCOUNTER — Inpatient Hospital Stay (HOSPITAL_COMMUNITY): Payer: Medicare PPO

## 2018-05-19 LAB — GLUCOSE, CAPILLARY
Glucose-Capillary: 105 mg/dL — ABNORMAL HIGH (ref 70–99)
Glucose-Capillary: 107 mg/dL — ABNORMAL HIGH (ref 70–99)
Glucose-Capillary: 76 mg/dL (ref 70–99)
Glucose-Capillary: 110 mg/dL — ABNORMAL HIGH (ref 70–99)
Glucose-Capillary: 114 mg/dL — ABNORMAL HIGH (ref 70–99)
Glucose-Capillary: 134 mg/dL — ABNORMAL HIGH (ref 70–99)

## 2018-05-19 LAB — CBC
HCT: 26.3 % — ABNORMAL LOW (ref 36.0–46.0)
Hemoglobin: 8.2 g/dL — ABNORMAL LOW (ref 12.0–15.0)
MCH: 29.7 pg (ref 26.0–34.0)
MCHC: 31.2 g/dL (ref 30.0–36.0)
MCV: 95.3 fL (ref 80.0–100.0)
Platelets: 267 10*3/uL (ref 150–400)
RBC: 2.76 MIL/uL — ABNORMAL LOW (ref 3.87–5.11)
RDW: 15.9 % — ABNORMAL HIGH (ref 11.5–15.5)
WBC: 7.1 10*3/uL (ref 4.0–10.5)
nRBC: 0 % (ref 0.0–0.2)

## 2018-05-19 LAB — BASIC METABOLIC PANEL
Anion gap: 8 (ref 5–15)
BUN: 15 mg/dL (ref 6–20)
CO2: 25 mmol/L (ref 22–32)
Calcium: 7.8 mg/dL — ABNORMAL LOW (ref 8.9–10.3)
Chloride: 99 mmol/L (ref 98–111)
Creatinine, Ser: 4.64 mg/dL — ABNORMAL HIGH (ref 0.44–1.00)
GFR calc Af Amer: 11 mL/min — ABNORMAL LOW (ref >60)
GFR calc non Af Amer: 10 mL/min — ABNORMAL LOW (ref >60)
Glucose, Bld: 111 mg/dL — ABNORMAL HIGH (ref 70–99)
Potassium: 3 mmol/L — ABNORMAL LOW (ref 3.5–5.1)
Sodium: 132 mmol/L — ABNORMAL LOW (ref 135–145)

## 2018-05-19 LAB — HEPATITIS B SURFACE ANTIBODY,QUALITATIVE: Hep B S Ab: NONREACTIVE

## 2018-05-19 MED ORDER — ALBUTEROL SULFATE (2.5 MG/3ML) 0.083% IN NEBU
2.5000 mg | INHALATION_SOLUTION | Freq: Two times a day (BID) | RESPIRATORY_TRACT | Status: DC
  Administered 2018-05-19: 2.5 mg via RESPIRATORY_TRACT
  Filled 2018-05-19 (×2): qty 3

## 2018-05-19 MED ORDER — FUROSEMIDE 40 MG PO TABS
40.0000 mg | ORAL_TABLET | Freq: Two times a day (BID) | ORAL | Status: DC
  Administered 2018-05-20: 40 mg via ORAL
  Filled 2018-05-19 (×2): qty 1

## 2018-05-19 MED ORDER — ALBUTEROL SULFATE (2.5 MG/3ML) 0.083% IN NEBU: 2.5000 mg | INHALATION_SOLUTION | Freq: Four times a day (QID) | RESPIRATORY_TRACT | Status: DC | PRN

## 2018-05-19 MED ORDER — EZETIMIBE 10 MG PO TABS
10.0000 mg | ORAL_TABLET | Freq: Every day | ORAL | Status: DC
  Administered 2018-05-19 – 2018-05-20 (×2): 10 mg via ORAL
  Filled 2018-05-19 (×2): qty 1

## 2018-05-19 MED ORDER — ALLOPURINOL 300 MG PO TABS
150.0000 mg | ORAL_TABLET | Freq: Every day | ORAL | Status: DC
  Administered 2018-05-19 – 2018-05-20 (×2): 150 mg via ORAL
  Filled 2018-05-19 (×2): qty 1

## 2018-05-19 MED ORDER — BISOPROLOL FUMARATE 10 MG PO TABS
20.0000 mg | ORAL_TABLET | Freq: Every day | ORAL | Status: DC
  Administered 2018-05-19 – 2018-05-20 (×2): 20 mg via ORAL
  Filled 2018-05-19 (×2): qty 2

## 2018-05-19 MED ORDER — ZOLPIDEM TARTRATE 5 MG PO TABS
5.0000 mg | ORAL_TABLET | Freq: Every evening | ORAL | Status: DC | PRN
  Administered 2018-05-19: 5 mg via ORAL
  Filled 2018-05-19: qty 1

## 2018-05-19 MED ORDER — INSULIN ASPART 100 UNIT/ML ~~LOC~~ SOLN: 0.0000 [IU] | Freq: Three times a day (TID) | SUBCUTANEOUS | Status: DC

## 2018-05-19 MED ORDER — ALBUTEROL SULFATE (2.5 MG/3ML) 0.083% IN NEBU
2.5000 mg | INHALATION_SOLUTION | Freq: Four times a day (QID) | RESPIRATORY_TRACT | Status: DC
  Administered 2018-05-19: 2.5 mg via RESPIRATORY_TRACT

## 2018-05-19 MED ORDER — NEPRO/CARBSTEADY PO LIQD
237.0000 mL | Freq: Every day | ORAL | Status: DC
  Administered 2018-05-19 – 2018-05-20 (×2): 237 mL via ORAL

## 2018-05-19 NOTE — Progress Notes (Signed)
[facility] Progress Note Patient Name: MELYNA HURON DOB: [birth year] MRN: [id number]   Date of Service  05/19/2018  HPI/Events of Note  Hypertension and Headache - BP = 166/76. Patient requests home Bisoprolol.   eICU Interventions  Will order: 1. Bisoprolol 20 mg PO now and Q day.      Intervention Category Major Interventions: Hypertension - evaluation and management  [name],Steven Eugene 05/19/2018, 12:56 AM

## 2018-05-19 NOTE — Progress Notes (Signed)
Patient back from dialysis; she didn't received the treatment today due to her left upper arm pain and edema. Dialysis was rescheduled for tomorrow. At this time patient is eating her breakfast; ice pack on LUE. Will continue to monitor.

## 2018-05-19 NOTE — Progress Notes (Signed)
Physical Therapy Treatment Patient Details Name: Jodi Bell MRN: [id number] DOB: 1960-12-16 Today's Date: 05/19/2018    History of Present Illness Pt is a 58 y/o female with a PMH significant for asthma, ESRD on HD MWF,  who presents with SOB at rest. Pt arrived via EMS and was intubated in the ED for severe respiratory distress, tachynea, confusion. Intubated 4/4-4/6.    PT Comments    Pt making steady progress with functional mobility. No SOB or dyspnea noted. Pt would continue to benefit from skilled physical therapy services at this time while admitted and after d/c to address the below listed limitations in order to improve overall safety and independence with functional mobility.    Follow Up Recommendations  Home health PT;Supervision for mobility/OOB     Equipment Recommendations  None recommended by PT    Recommendations for Other Services       Precautions / Restrictions Precautions Precautions: Fall Restrictions Weight Bearing Restrictions: No    Mobility  Bed Mobility Overal bed mobility: Modified Independent                Transfers Overall transfer level: Needs assistance Equipment used: None Transfers: Sit to/from Stand Sit to Stand: Supervision         General transfer comment: supervision for safety  Ambulation/Gait Ambulation/Gait assistance: Min guard Gait Distance (Feet): 20 Feet Assistive device: None Gait Pattern/deviations: Antalgic;Step-through pattern;Decreased step length - right;Decreased step length - left;Decreased stance time - left;Decreased stride length;Decreased weight shift to left Gait velocity: decreased   General Gait Details: pt with mildly antalgic gait pattern which she reported as a prior pain/injury to her L groin area. Pt with no instability or need for physical assistance, min guard for safety   Stairs             Wheelchair Mobility    Modified Rankin (Stroke Patients Only)       Balance  Overall balance assessment: Needs assistance Sitting-balance support: Feet supported;No upper extremity supported Sitting balance-Leahy Scale: Good     Standing balance support: During functional activity Standing balance-Leahy Scale: Fair                              Cognition Arousal/Alertness: Awake/alert Behavior During Therapy: WFL for tasks assessed/performed Overall Cognitive Status: Within Functional Limits for tasks assessed                                        Exercises      General Comments        Pertinent Vitals/Pain Pain Assessment: No/denies pain    Home Living                      Prior Function            PT Goals (current goals can now be found in the care plan section) Acute Rehab PT Goals PT Goal Formulation: With patient Time For Goal Achievement: 05/25/18 Potential to Achieve Goals: Good Progress towards PT goals: Progressing toward goals    Frequency    Min 3X/week      PT Plan Current plan remains appropriate    Co-evaluation              AM-PAC PT "6 Clicks" Mobility   Outcome Measure  Help needed turning from your  back to your side while in a flat bed without using bedrails?: None Help needed moving from lying on your back to sitting on the side of a flat bed without using bedrails?: None Help needed moving to and from a bed to a chair (including a wheelchair)?: A Little Help needed standing up from a chair using your arms (e.g., wheelchair or bedside chair)?: None Help needed to walk in hospital room?: A Little Help needed climbing 3-5 steps with a railing? : A Little 6 Click Score: 21    End of Session   Activity Tolerance: Patient tolerated treatment well Patient left: in bed;with call bell/phone within reach Nurse Communication: Mobility status PT Visit Diagnosis: Unsteadiness on feet (R26.81);Difficulty in walking, not elsewhere classified (R26.2)     Time: 1324-4010 PT  Time Calculation (min) (ACUTE ONLY): 13 min  Charges:  $Gait Training: 8-22 mins                     Sherie Don, [place], DPT  Acute Rehabilitation Services Pager [phone] Office [place] 05/19/2018, 2:56 PM

## 2018-05-19 NOTE — Progress Notes (Signed)
Occupational Therapy Treatment Patient Details Name: Jodi Bell MRN: [id number] DOB: 22-Aug-1960 Today's Date: 05/19/2018    History of present illness Pt is a 58 y/o female with a PMH significant for asthma, ESRD on HD MWF,  who presents with SOB at rest. Pt arrived via EMS and was intubated in the ED for severe respiratory distress, tachynea, confusion. Intubated 4/4-4/6.   OT comments  Pt performing grooming at sink with supervision in standing and leaning on counter to preserve energy. Pt performing light bathing and dressing in sitting in standing with supervisionA. Pt performing ADL functional mobility with RW with supervisionA. Pt would benefit from continued OT skilled services. OT to follow acutely.   RA: >90% O2    Follow Up Recommendations  Home health OT;Supervision - Intermittent    Equipment Recommendations  None recommended by OT    Recommendations for Other Services      Precautions / Restrictions Precautions Precautions: Fall Restrictions Weight Bearing Restrictions: No       Mobility Bed Mobility Overal bed mobility: Modified Independent Bed Mobility: Supine to Sit     Supine to sit: Supervision     General bed mobility comments: no assist required  Transfers Overall transfer level: Needs assistance Equipment used: None Transfers: Sit to/from Stand Sit to Stand: Supervision         General transfer comment: supervision for safety    Balance Overall balance assessment: Needs assistance Sitting-balance support: Feet supported;No upper extremity supported Sitting balance-Leahy Scale: Good     Standing balance support: During functional activity Standing balance-Leahy Scale: Fair                             ADL either performed or assessed with clinical judgement   ADL Overall ADL's : Needs assistance/impaired Eating/Feeding: Modified independent;Sitting   Grooming: Supervision/safety;Set up;Standing   Upper Body  Bathing: Supervision/ safety;Set up;Standing   Lower Body Bathing: Set up;Sitting/lateral leans;Sit to/from stand   Upper Body Dressing : Modified independent;Sitting;Standing   Lower Body Dressing: Set up;Sitting/lateral leans;Sit to/from stand   Toilet Transfer: Supervision/safety;Regular Museum/gallery exhibitions officer and Hygiene: Supervision/safety;Sitting/lateral lean;Sit to/from stand       Functional mobility during ADLs: Min guard General ADL Comments: Decreased activity tolerance, but pt reported close to baseline.     Vision   Vision Assessment?: No apparent visual deficits   Perception     Praxis      Cognition Arousal/Alertness: Awake/alert Behavior During Therapy: WFL for tasks assessed/performed Overall Cognitive Status: Within Functional Limits for tasks assessed                                          Exercises     Shoulder Instructions       General Comments O2 levels on RA for mobility and ADl at sink. Pt performing functional tasks with supervisionA.    Pertinent Vitals/ Pain       Pain Assessment: No/denies pain  Home Living                                          Prior Functioning/Environment              Frequency  Min 2X/week  Progress Toward Goals  OT Goals(current goals can now be found in the care plan section)  Progress towards OT goals: Progressing toward goals  Acute Rehab OT Goals Patient Stated Goal: Return home at d/c; get rid of the tremors OT Goal Formulation: With patient Time For Goal Achievement: 06/01/18 Potential to Achieve Goals: Good ADL Goals Pt Will Transfer to Toilet: with modified independence;regular height toilet Pt Will Perform Toileting - Clothing Manipulation and hygiene: with modified independence;sit to/from stand;sitting/lateral leans Pt/caregiver will Perform Home Exercise Program: Both right and left upper extremity;With written HEP  provided Additional ADL Goal #1: Pt will independently demonstrate at least one energy conservation technique during functional task completion.  Plan Discharge plan remains appropriate    Co-evaluation                 AM-PAC OT "6 Clicks" Daily Activity     Outcome Measure   Help from another person eating meals?: None Help from another person taking care of personal grooming?: None Help from another person toileting, which includes using toliet, bedpan, or urinal?: None Help from another person bathing (including washing, rinsing, drying)?: A Little Help from another person to put on and taking off regular upper body clothing?: None Help from another person to put on and taking off regular lower body clothing?: None 6 Click Score: 23    End of Session Equipment Utilized During Treatment: Oxygen  OT Visit Diagnosis: Muscle weakness (generalized) (M62.81);Unsteadiness on feet (R26.81)   Activity Tolerance Patient tolerated treatment well   Patient Left in bed;with call bell/phone within reach(pt does not like recliner- "makes me feel stiff.")   Nurse Communication Mobility status        Time: 4403-4742 OT Time Calculation (min): 17 min  Charges: OT General Charges $OT Visit: 1 Visit OT Treatments $Self Care/Home Management : 8-22 mins  Darryl Nestle) Marsa Aris OTR/L Acute Rehabilitation Services Pager: [phone] Office: [phone]   Fredda Hammed 05/19/2018, 3:20 PM

## 2018-05-19 NOTE — Progress Notes (Signed)
[facility] Progress Note   Assessment/ Plan:   1. Acute resp failure - in patient recently started on dialysis 6 wks ago , gets HD in [facility] w/ [place] group.  Bilat pulm infiltrates, RVP and COVID-19 negative, cultures negative so far, antibiotics narrowed to Zosyn per primary.  Extubated  2.  ESRD on HD MWF - started 6 wks ago.  Will reschedule rx to tomorrow given infiltration.  Suspect it will resolve.    3. DM2 on insulin  4. HTN - on 4 bp meds and still takes some lasix. Better with UF  5. H/o gout  6. H/o HL  7.  Tremor: Possibly d/t combination of solumedrol, missed HD, per pt gabapentin has this effect on her but hasn't had any here so anything would be PTA  Would recommend her decreasing gabapentin to 100 mg QHS--> pt reports she takes TID.    Subjective:    Came for dialysis today but noted infiltration- unable to access.  Will reschedule rx for tomorrow.  Fistula with with T/B.     Objective:   BP (!) 166/86 (BP Location: Right Arm)   Pulse 81   Temp 98.2 F (36.8 C) (Oral)   Resp 20   Ht 5\' 2"  (1.575 m)   Wt 77 kg   SpO2 95%   BMI 31.05 kg/m   Physical Exam: GEN older woman, NAD, sitting in chair HEENT EOMI PERRL NECK JVD PULM bilateral faint wheezes CV RRR no m/rr/g ABD soft nontender NABS EXT no LE edema NEURO AAO x 3, no tremor SKIN no rashes or lesions ACCESS: LUE AVF + T/B-- infiltration noted.    Labs: BMET Recent Labs  Lab 05/16/18 0401 05/16/18 0533 05/16/18 5465 05/17/18 0344 05/17/18 0656 05/18/18 0307 05/19/18 0226  NA 130* 132* 131* 130* 130* 132* 132*  K 3.4* 3.2* 3.4* 3.6 3.9 4.1 3.0*  CL 92*  --   --   --  93* 91* 99  CO2 24  --   --   --  20* 26 25  GLUCOSE 142*  --   --   --  88 142* 111*  BUN 19  --   --   --  28* 35* 15  CREATININE 5.79*  --   --   --  7.26* 8.44* 4.64*  CALCIUM 8.5*  --   --   --  8.3* 7.8* 7.8*  PHOS  --   --   --   --  5.6*  --   --    CBC Recent Labs  Lab 05/16/18 0400   05/17/18 0344 05/17/18 0940 05/18/18 1258 05/19/18 0226  WBC 11.2*  --   --  7.6 9.7 7.1  NEUTROABS 5.6  --   --  6.2  --   --   HGB 10.6*   < > 12.9 8.5* 8.7* 8.2*  HCT 34.1*   < > 38.0 25.8* 28.1* 26.3*  MCV 97.4  --   --  91.8 94.6 95.3  PLT 347  --   --  233 350 267   < > = values in this interval not displayed.    @IMGRELPRIORS @ Medications:    . albuterol  2.5 mg Nebulization Q6H  . amLODipine  10 mg Oral Daily  . bisoprolol  20 mg Oral Daily  . Chlorhexidine Gluconate Cloth  6 each Topical Q0600  . cloNIDine  0.2 mg Oral BID  . guaiFENesin  600 mg Oral BID  . heparin injection (  subcutaneous)  5,000 Units Subcutaneous Q8H  . insulin aspart  0-15 Units Subcutaneous Q4H  . mouth rinse  15 mL Mouth Rinse BID     Madelon Lips, MD [facility] pgr [phone] 05/19/2018, 10:22 AM

## 2018-05-19 NOTE — Progress Notes (Signed)
Nutrition Follow Up  RD working remotely  [place]:   Obesity unspecified  INTERVENTION:    Nepro Shake po daily, each supplement provides 425 kcal and 19 grams protein  NEW NUTRITION DIAGNOSIS:   Increased nutrient needs related to acute illness, chronic illness as evidenced by estimated needs, ongoing  NEW GOAL:   Patient will meet greater than or equal to 90% of their needs, progressing  MONITOR:   PO intake, Supplement acceptance, Labs, Skin, Weight trends, I & O's  ASSESSMENT:   58 yo female with PMH of DM, asthma, ESRD on HD, and HTN who was admitted with sudden onset SOB. Intubated 4/4. COVID-19 negative.   4/5 admitted with acute respiratory failure with hypoxia  4/6 extubated, TF discontinued via OGT 4/7 transferred out of MICU  RD attempted to speak with over room phone; no answer. Pt currently on a Renal-1200 ml fluid restriction diet. PO intake at 60% per flowsheet records.  Adult Tube Feeding Protocol order still in place; will D/C. Labs reviewed. Na 132 (L). K 3.0 (L). CBG's [phone].  Diet Order:   Diet Order            Diet renal with fluid restriction Fluid restriction: 1200 mL Fluid; Room service appropriate? Yes; Fluid consistency: Thin  Diet effective now             EDUCATION NEEDS:   Not appropriate for education at this time  Skin:  Skin Assessment: Reviewed RN Assessment  Last BM:  PTA  Height:   Ht Readings from Last 1 Encounters:  05/16/18 5\' 2"  (1.575 m)   Weight:   Wt Readings from Last 1 Encounters:  05/19/18 77 kg   Ideal Body Weight:  50 kg  BMI:  Body mass index is 31.05 kg/m.  Estimated Nutritional Needs:   Kcal:  1800-2000  Protein:  90-105 gm  Fluid:  1,0000 ml + UOP  Arthur Holms, RD, LDN Pager #: [phone] After-Hours Pager #: [phone]

## 2018-05-19 NOTE — Progress Notes (Signed)
Patient's sister Kennyth Lose, called staff and asked to speak with a doctor regarding patient's AV fistula. She said that problem must be addressed today not to wait until tomorrow. Writer paged Dr. Hollie Salk and made her aware. Will continue to monitor.

## 2018-05-19 NOTE — Progress Notes (Signed)
PROGRESS NOTE  Jodi Bell  MOQ:[id number] DOB: 19-Jul-1960 DOA: 05/16/2018 PCP: [facility]   Brief Narrative: Jodi Bell is a 58 y.o. female with a history of asthma, tobacco use, ESRD (MWF begun recently), HTN who presented to ED by EMS with increasing dyspnea. Solumedrol and duonebs given en route though continued to be in respiratory distress requiring intubation in ED. CXR showed cardiomegaly with bilateral hazy opacities which improved with dialysis. She was given antibiotics and extubated 4/6, weaned to supplemental oxygen. Covid testing was sent and found to be negative.    Assessment & Plan: Active Problems:   Asthma   Suspected Covid-19 Virus Infection   ESRD (end stage renal disease) ([place])  Acute hypoxic respiratory failure: s/p ETT 4/5 - 4/6, due to pulmonary edema in setting of asthma and tobacco use. RVP and covid negative/viral infection ruled out, cultures negative so zosyn discontinued. - Continue supplemental oxygen to maintain SpO2 >92%  ESRD:  - Continue iHD MWF per nephrology. Due to left AVF infiltration, nephrology will cancel HD today and retry tomorrow. Continues to have thrill and bruit.  - Continue lasix  Asthma:  - Continue singulair - IV steroid discontinued, no current wheezing. - Continue nebs prn  Tobacco use:  - Cessation counseling recommended.   Tremors: - Reduced gabapentin to 100mg  once daily.   HTN:  - Continue norvasc, clonidine, bisoprolol home meds. If remains elevated, will also restart hydralazine/imdur - Continue prn hydralazine.   T2DM:  - Home sitagliptin, tresiba 14u qHS being held - Continue SSI, at inpatient goal.  Obesity: BMI 31 - Weight loss recommended  Gout: Stable - Continue allopurinol  Hyperlipidemia:  - Continue zetia.  DVT prophylaxis: Heparin Code Status: Full Family Communication: None at bedside. Patient did not request call to family.  Disposition Plan: Home once stabilized.    Consultants:   CCM primary 4/5 - 4/7  Nephrology  Procedures:   Hemodialysis  Antimicrobials:  Zosyn 4/5 - 4/6  Linezolid 4/5   Subjective: Breathing better, not at baseline, some swelling to the LUE over past 24 hours. No fevers, no cough, no wheezing.  Objective: Vitals:   05/19/18 0444 05/19/18 0447 05/19/18 0841 05/19/18 0842  BP:  (!) 166/86    Pulse:  84  81  Resp:  16  20  Temp:  98.2 F (36.8 C)    TempSrc:  Oral    SpO2:  92% 95% 95%  Weight: 77 kg     Height:        Intake/Output Summary (Last 24 hours) at 05/19/2018 1121 Last data filed at 05/18/2018 1619 Gross per 24 hour  Intake -  Output 2503 ml  Net -2503 ml   Filed Weights   05/18/18 1210 05/18/18 1619 05/19/18 0444  Weight: 78.5 kg 75.9 kg 77 kg    Gen: 58 y.o. female in no distress  Pulm: Non-labored breathing. Clear to auscultation bilaterally.  CV: Regular rate and rhythm. No murmur, rub, or gallop. No JVD, no pedal edema. GI: Abdomen soft, non-tender, non-distended, with normoactive bowel sounds. No organomegaly or masses felt. Ext: Warm, no deformities. LUE AVF with distal bruit and thrill, proximally swollen, mildly tender no erythema. Skin: As above, otherwise no rashes, lesions or ulcers Neuro: Alert and oriented. No focal neurological deficits. Psych: Judgement and insight appear normal. Mood & affect appropriate.   Data Reviewed: I have personally reviewed following labs and imaging studies  CBC: Recent Labs  Lab 05/16/18 0400  05/16/18 0951  05/17/18 0344 05/17/18 0940 05/18/18 1258 05/19/18 0226  WBC 11.2*  --   --   --  7.6 9.7 7.1  NEUTROABS 5.6  --   --   --  6.2  --   --   HGB 10.6*   < > 8.8* 12.9 8.5* 8.7* 8.2*  HCT 34.1*   < > 26.0* 38.0 25.8* 28.1* 26.3*  MCV 97.4  --   --   --  91.8 94.6 95.3  PLT 347  --   --   --  233 350 267   < > = values in this interval not displayed.   Basic Metabolic Panel: Recent Labs  Lab 05/16/18 0401  05/16/18 0951 05/17/18  0344 05/17/18 0656 05/18/18 0307 05/19/18 0226  NA 130*   < > 131* 130* 130* 132* 132*  K 3.4*   < > 3.4* 3.6 3.9 4.1 3.0*  CL 92*  --   --   --  93* 91* 99  CO2 24  --   --   --  20* 26 25  GLUCOSE 142*  --   --   --  88 142* 111*  BUN 19  --   --   --  28* 35* 15  CREATININE 5.79*  --   --   --  7.26* 8.44* 4.64*  CALCIUM 8.5*  --   --   --  8.3* 7.8* 7.8*  MG  --   --   --   --  2.1  --   --   PHOS  --   --   --   --  5.6*  --   --    < > = values in this interval not displayed.   GFR: Estimated Creatinine Clearance: 12.7 mL/min (A) (by C-G formula based on SCr of 4.64 mg/dL (H)). Liver Function Tests: Recent Labs  Lab 05/16/18 0401 05/17/18 0656  AST 27 21  ALT 14 11  ALKPHOS 144* 100  BILITOT 0.4 0.5  PROT 7.4 5.9*  ALBUMIN 3.0* 2.4*   No results for input(s): LIPASE, AMYLASE in the last 168 hours. No results for input(s): AMMONIA in the last 168 hours. Coagulation Profile: No results for input(s): INR, PROTIME in the last 168 hours. Cardiac Enzymes: Recent Labs  Lab 05/16/18 0400  TROPONINI <0.03   BNP (last 3 results) No results for input(s): PROBNP in the last 8760 hours. HbA1C: No results for input(s): HGBA1C in the last 72 hours. CBG: Recent Labs  Lab 05/18/18 1731 05/18/18 2049 05/19/18 0125 05/19/18 0608 05/19/18 0834  GLUCAP 168* 146* 105* 107* 76   Lipid Profile: No results for input(s): CHOL, HDL, LDLCALC, TRIG, CHOLHDL, LDLDIRECT in the last 72 hours. Thyroid Function Tests: No results for input(s): TSH, T4TOTAL, FREET4, T3FREE, THYROIDAB in the last 72 hours. Anemia Panel: No results for input(s): VITAMINB12, FOLATE, FERRITIN, TIBC, IRON, RETICCTPCT in the last 72 hours. Urine analysis: No results found for: COLORURINE, APPEARANCEUR, LABSPEC, [place], GLUCOSEU, HGBUR, [place], [place], PROTEINUR, UROBILINOGEN, NITRITE, LEUKOCYTESUR Recent Results (from the past 240 hour(s))  Blood culture (routine x 2)     Status: None  (Preliminary result)   Collection Time: 05/16/18  4:01 AM  Result Value Ref Range Status   Specimen Description BLOOD RIGHT ARM  Final   Special Requests   Final    BOTTLES DRAWN AEROBIC AND ANAEROBIC Blood Culture adequate volume   Culture   Final    NO GROWTH 3 DAYS Performed at [facility]  Lab, [address], [place], [place] [address]    Report Status PENDING  Incomplete  Blood culture (routine x 2)     Status: None (Preliminary result)   Collection Time: 05/16/18  4:02 AM  Result Value Ref Range Status   Specimen Description BLOOD RIGHT HAND  Final   Special Requests   Final    BOTTLES DRAWN AEROBIC ONLY Blood Culture adequate volume   Culture   Final    NO GROWTH 3 DAYS Performed at [facility], [place] [address], [place], [place] [address]    Report Status PENDING  Incomplete  Novel Coronavirus, NAA (hospital order; send-out to ref lab)     Status: None   Collection Time: 05/16/18  4:03 AM  Result Value Ref Range Status   SARS-CoV-2, NAA NOT DETECTED NOT DETECTED Final    Comment: Performed at [facility]  Final    Comment: Performed at [facility], [place] [address], [place], [place] [address]  MRSA PCR Screening     Status: None   Collection Time: 05/16/18  7:17 AM  Result Value Ref Range Status   MRSA by PCR NEGATIVE NEGATIVE Final    Comment:        The GeneXpert MRSA Assay (FDA approved for NASAL specimens only), is one component of a comprehensive MRSA colonization surveillance program. It is not intended to diagnose MRSA infection nor to guide or monitor treatment for MRSA infections. Performed at [facility], [place] [address] [place], [place] [address]   Culture, respiratory (non-expectorated)     Status: None   Collection Time: 05/16/18  9:30 AM  Result Value Ref Range Status   Specimen Description TRACHEAL ASPIRATE  Final   Special Requests Normal  Final   Gram Stain   Final    RARE WBC  PRESENT, PREDOMINANTLY MONONUCLEAR RARE GRAM POSITIVE COCCI IN PAIRS    Culture   Final    FEW Consistent with normal respiratory flora. Performed at [facility], [place] [address] [place],  [address]    Report Status 05/18/2018 FINAL  Final  Respiratory Panel by PCR     Status: None   Collection Time: 05/16/18 11:24 AM  Result Value Ref Range Status   Adenovirus NOT DETECTED NOT DETECTED Final   Coronavirus 229E NOT DETECTED NOT DETECTED Final    Comment: (NOTE) The Coronavirus on the Respiratory Panel, DOES NOT test for the novel  Coronavirus (2019 nCoV)    Coronavirus HKU1 NOT DETECTED NOT DETECTED Final   Coronavirus NL63 NOT DETECTED NOT DETECTED Final   Coronavirus OC43 NOT DETECTED NOT DETECTED Final   Metapneumovirus NOT DETECTED NOT DETECTED Final   Rhinovirus / Enterovirus NOT DETECTED NOT DETECTED Final   Influenza A NOT DETECTED NOT DETECTED Final   Influenza B NOT DETECTED NOT DETECTED Final   Parainfluenza Virus 1 NOT DETECTED NOT DETECTED Final   Parainfluenza Virus 2 NOT DETECTED NOT DETECTED Final   Parainfluenza Virus 3 NOT DETECTED NOT DETECTED Final   Parainfluenza Virus 4 NOT DETECTED NOT DETECTED Final   Respiratory Syncytial Virus NOT DETECTED NOT DETECTED Final   Bordetella pertussis NOT DETECTED NOT DETECTED Final   Chlamydophila pneumoniae NOT DETECTED NOT DETECTED Final   Mycoplasma pneumoniae NOT DETECTED NOT DETECTED Final    Comment: Performed at [facility], [place] [address], [place],  [address]      Radiology Studies: Dg Chest [place]  1 View  Result Date: 05/19/2018 CLINICAL DATA:  Respiratory distress, shortness of breath. EXAM: PORTABLE CHEST 1 VIEW COMPARISON:  05/18/2018 FINDINGS: Lungs are somewhat hypoinflated demonstrate hazy prominence of the perihilar markings suggesting mild vascular congestion. No focal lobar consolidation or effusion. Moderate stable cardiomegaly. Remainder of the exam is unchanged. IMPRESSION:  Cardiomegaly with suggestion of mild vascular congestion. Electronically Signed   By: Marin Olp M.D.   On: 05/19/2018 08:27   Dg Chest Port 1 View  Result Date: 05/18/2018 CLINICAL DATA:  Respiratory failure EXAM: PORTABLE CHEST 1 VIEW COMPARISON:  05/17/2018 FINDINGS: Cardiac shadow is enlarged but stable. Aortic calcifications are again seen. Endotracheal tube and nasogastric catheter have been removed in the interval. Lungs are well aerated bilaterally. Improved aeration is noted bilaterally with only mild residual hazy opacities seen. Tiny left pleural effusion is noted. IMPRESSION: Interval removal of tubes and lines. Improved aeration is noted with tiny left pleural effusion. Electronically Signed   By: Inez Catalina M.D.   On: 05/18/2018 08:16    Scheduled Meds: . albuterol  2.5 mg Nebulization Q6H  . amLODipine  10 mg Oral Daily  . bisoprolol  20 mg Oral Daily  . Chlorhexidine Gluconate Cloth  6 each Topical Q0600  . cloNIDine  0.2 mg Oral BID  . guaiFENesin  600 mg Oral BID  . heparin injection (subcutaneous)  5,000 Units Subcutaneous Q8H  . insulin aspart  0-15 Units Subcutaneous Q4H  . mouth rinse  15 mL Mouth Rinse BID   Continuous Infusions: . sodium chloride    . sodium chloride       LOS: 3 days   Time spent: 25 minutes.  Patrecia Pour, MD [facility] [email or web address] Password [facility] 05/19/2018, 11:21 AM

## 2018-05-20 DIAGNOSIS — E8770 Fluid overload, unspecified: Secondary | ICD-10-CM

## 2018-05-20 LAB — RENAL FUNCTION PANEL
Albumin: 2.2 g/dL — ABNORMAL LOW (ref 3.5–5.0)
Anion gap: 10 (ref 5–15)
BUN: 23 mg/dL — ABNORMAL HIGH (ref 6–20)
CO2: 26 mmol/L (ref 22–32)
Calcium: 8.2 mg/dL — ABNORMAL LOW (ref 8.9–10.3)
Chloride: 99 mmol/L (ref 98–111)
Creatinine, Ser: 6.1 mg/dL — ABNORMAL HIGH (ref 0.44–1.00)
GFR calc Af Amer: 8 mL/min — ABNORMAL LOW (ref >60)
GFR calc non Af Amer: 7 mL/min — ABNORMAL LOW (ref >60)
Glucose, Bld: 98 mg/dL (ref 70–99)
Phosphorus: 4.4 mg/dL (ref 2.5–4.6)
Potassium: 2.8 mmol/L — ABNORMAL LOW (ref 3.5–5.1)
Sodium: 135 mmol/L (ref 135–145)

## 2018-05-20 LAB — GLUCOSE, CAPILLARY: Glucose-Capillary: 76 mg/dL (ref 70–99)

## 2018-05-20 MED ORDER — POTASSIUM CHLORIDE CRYS ER 20 MEQ PO TBCR
20.0000 meq | EXTENDED_RELEASE_TABLET | Freq: Once | ORAL | Status: AC
  Administered 2018-05-20: 20 meq via ORAL
  Filled 2018-05-20: qty 1

## 2018-05-20 MED ORDER — GABAPENTIN 100 MG PO CAPS: 100.0000 mg | ORAL_CAPSULE | Freq: Every day | ORAL | 0 refills | Status: DC

## 2018-05-20 MED ORDER — ONDANSETRON HCL 4 MG PO TABS: 4.0000 mg | ORAL_TABLET | Freq: Three times a day (TID) | ORAL | 0 refills | Status: DC | PRN

## 2018-05-20 NOTE — Progress Notes (Signed)
Patient went to dialysis. She verbalized she feels much better on her LUE. Present bruit/thrill, edema decreased.

## 2018-05-20 NOTE — TOC Transition Note (Signed)
Transition of Care [facility]) - CM/SW Discharge Note   Patient Details  Name: AZAYLIA FONG MRN: [id number] Date of Birth: Dec 22, 1960  [facility] of [facility]) CM/SW Contact:  Sharin Mons, RN Phone Number: 05/20/2018, 4:24 PM   Clinical Narrative:   Admitted with KI/ sob. PMH significant for asthma, ESRD on HD MWF. From home with daughter and son in law. Pt declined home health services ( PT). Pt states has transportation.      Freddy Finner (Daughter) Simone Curia [phone]          Patient Goals and CMS Choice        Discharge Placement                       Discharge Plan and Services                          Social Determinants of Health (SDOH) Interventions     Readmission Risk Interventions No flowsheet data found.

## 2018-05-20 NOTE — Progress Notes (Signed)
HD initiated via L AVF using 16g needles x2. No heparin treatment. Dr. Hollie Salk notified of 16g needle use due to swelling of arterial aspect of avf. Patient currently without complaints. Call bell at bedside. Continue to monitor.

## 2018-05-20 NOTE — Progress Notes (Signed)
Patient back from dialysis; alert and oriented x 4; no acute distress noted, no complaints. Will continue to monitor.

## 2018-05-20 NOTE — Procedures (Signed)
Patient seen and examined on Hemodialysis. BP (!) 208/88   Pulse 81   Temp (!) 97 F (36.1 C) (Oral)   Resp 14   Ht 5\' 2"  (1.575 m)   Wt 77.1 kg   SpO2 99%   BMI 31.09 kg/m   QB 400 mL/ min, UF goal 3L  2 16 g needles today.  Eager to go.  Tolerating treatment without complaints at this time.  Madelon Lips MD [facility] pgr [phone] 11:30 AM

## 2018-05-20 NOTE — Progress Notes (Signed)
Patient was discharged home by MD order; discharged instructions review and give to patient with care notes; IV DIC;  AV fistula LUE - positive bruit/thrill; patient will be escorted to the car by nurse via wheelchair.

## 2018-05-20 NOTE — Progress Notes (Signed)
Physical Therapy Treatment Patient Details Name: Jodi Bell MRN: [id number] DOB: [birth year] Today's Date: 05/20/2018    History of Present Illness Pt is a 58 y/o female with a PMH significant for asthma, ESRD on HD MWF,  who presents with SOB at rest. Pt arrived via EMS and was intubated in the ED for severe respiratory distress, tachynea, confusion. Intubated 4/4-4/6.    PT Comments    Pt making steady progress with functional mobility. Pt would continue to benefit from skilled physical therapy services at this time while admitted and after d/c to address the below listed limitations in order to improve overall safety and independence with functional mobility.    Follow Up Recommendations  Home health PT;Supervision for mobility/OOB     Equipment Recommendations  None recommended by PT    Recommendations for Other Services       Precautions / Restrictions Precautions Precautions: Fall Restrictions Weight Bearing Restrictions: No    Mobility  Bed Mobility               General bed mobility comments: pt sitting EOB upon arrival  Transfers Overall transfer level: Needs assistance Equipment used: None Transfers: Sit to/from Stand Sit to Stand: Supervision         General transfer comment: supervision for safety  Ambulation/Gait Ambulation/Gait assistance: Min guard;Supervision Gait Distance (Feet): 75 Feet Assistive device: None Gait Pattern/deviations: Antalgic;Step-through pattern;Decreased step length - right;Decreased step length - left;Decreased stance time - left;Decreased stride length;Decreased weight shift to left Gait velocity: decreased   General Gait Details: pt with mildly antalgic gait pattern which she reported as a prior pain/injury to her L groin area. Pt with no instability or need for physical assistance, min guard to supervision level for safety   Stairs             Wheelchair Mobility    Modified Rankin (Stroke Patients  Only)       Balance Overall balance assessment: Needs assistance Sitting-balance support: Feet supported;No upper extremity supported Sitting balance-Leahy Scale: Good     Standing balance support: During functional activity Standing balance-Leahy Scale: Fair                              Cognition Arousal/Alertness: Awake/alert Behavior During Therapy: WFL for tasks assessed/performed Overall Cognitive Status: Within Functional Limits for tasks assessed                                        Exercises      General Comments        Pertinent Vitals/Pain Pain Assessment: No/denies pain    Home Living                      Prior Function            PT Goals (current goals can now be found in the care plan section) Acute Rehab PT Goals PT Goal Formulation: With patient Time For Goal Achievement: 05/25/18 Potential to Achieve Goals: Good Progress towards PT goals: Progressing toward goals    Frequency    Min 3X/week      PT Plan Current plan remains appropriate    Co-evaluation              AM-PAC PT "6 Clicks" Mobility   Outcome Measure  Help needed turning  from your back to your side while in a flat bed without using bedrails?: None Help needed moving from lying on your back to sitting on the side of a flat bed without using bedrails?: None Help needed moving to and from a bed to a chair (including a wheelchair)?: None Help needed standing up from a chair using your arms (e.g., wheelchair or bedside chair)?: None Help needed to walk in hospital room?: A Little Help needed climbing 3-5 steps with a railing? : A Little 6 Click Score: 22    End of Session   Activity Tolerance: Patient tolerated treatment well Patient left: in bed;with call bell/phone within reach;Other (comment)(sitting EOB) Nurse Communication: Mobility status PT Visit Diagnosis: Unsteadiness on feet (R26.81);Difficulty in walking, not  elsewhere classified (R26.2)     Time: 3343-5686 PT Time Calculation (min) (ACUTE ONLY): 15 min  Charges:  $Gait Training: 8-22 mins                     Sherie Don, [place], DPT  Acute Rehabilitation Services Pager [phone] [address] 05/20/2018, 12:40 PM

## 2018-05-20 NOTE — Discharge Summary (Signed)
Physician Discharge Summary  Jodi Bell IDC:[id number] DOB: [birth year] DOA: 05/16/2018  PCP: [facility]  Admit date: 05/16/2018 Discharge date: 05/20/2018  Admitted From: Home Disposition: Home  Recommendations for Outpatient Follow-up:  1. Follow up with PCP in 1 week  2. Follow up with outpatient dialysis as scheduled 3. Follow up in ED if symptoms worsen or new appear   Home Health: Home with PT/OT Equipment/Devices: None  Discharge Condition: Stable CODE STATUS: Full Diet recommendation: Heart healthy/renal hemodialysis diet with fluid restriction  Brief/Interim Summary: 58 year old female with history of asthma, tobacco use, end-stage renal disease on dialysis which was started 6 weeks ago, hypertension presented on 05/16/2018 with increasing dyspnea.  Patient was intubated in the ED.  Chest x-ray showed cardiomegaly with bilateral hazy opacities which improved with dialysis.  She was given antibiotics.  She was extubated on 05/17/2018.  COVID 19 testing was sent out and was found to be negative.  She was transferred to hospitalist service on 05/19/2018.-Respiratory status has much improved with dialysis.  She will be discharged home today.   Discharge Diagnoses:  Active Problems:   Asthma   Suspected Covid-19 Virus Infection   ESRD (end stage renal disease) ([place])  Acute hypoxic respiratory failure: Most likely due to pulmonary edema in the setting of asthma and tobacco use -Status post intubation on 05/16/2018 and extubation on 05/17/2018. -Infiltrates seem to have been related to fluid overload which resolved with dialysis.  Cultures have been negative so far.  Zosyn has been discontinued. -Currently on room air.  End-stage renal disease on hemodialysis -Hemodialysis was started 6 weeks ago.  Nephrology following.  Undergoing dialysis today as well.  Discharged home after dialysis today.  Outpatient follow-up with dialysis as scheduled. Patient is also on  Lasix.  Asthma -Continue Singulair.   IV Solu-Medrol discontinued. -Continue Symbicort.  Tobacco use -Patient needs to stop smoking  Tremors -Gabapentin has been reduced to 100 mg daily  Hypertension--blood pressure on the higher side.  Continue Norvasc, clonidine, bisoprolol.  Still takes Lasix.  Resume hydralazine and Imdur.  Diabetes mellitus type 2 -Home medications on hold.  Continue CBGs with SSI  Obesity -Outpatient follow-up  Gout--continue allopurinol  Hyperlipidemia--continue Zetia   Discharge Instructions  Discharge Instructions    Call MD for:  difficulty breathing, headache or visual disturbances   Complete by:  As directed    Call MD for:  extreme fatigue   Complete by:  As directed    Call MD for:  persistant dizziness or light-headedness   Complete by:  As directed    Diet - low sodium heart healthy   Complete by:  As directed    Diet Carb Modified   Complete by:  As directed    Increase activity slowly   Complete by:  As directed      Allergies as of 05/20/2018   No Known Allergies     Medication List    TAKE these medications   allopurinol 300 MG tablet Commonly known as:  ZYLOPRIM Take 150-300 mg by mouth daily.   amLODipine 10 MG tablet Commonly known as:  NORVASC Take 10 mg by mouth daily.   bisoprolol 10 MG tablet Commonly known as:  ZEBETA Take 20 mg by mouth daily.   budesonide-formoterol 160-4.5 MCG/ACT inhaler Commonly known as:  SYMBICORT Inhale 2 puffs into the lungs 2 (two) times daily.   busPIRone 5 MG tablet Commonly known as:  BUSPAR Take 5 mg by mouth 2 (two) times daily.  cloNIDine 0.2 MG tablet Commonly known as:  CATAPRES Take 0.2 mg by mouth 2 (two) times daily.   ezetimibe 10 MG tablet Commonly known as:  ZETIA Take 10 mg by mouth daily.   furosemide 40 MG tablet Commonly known as:  LASIX Take 40 mg by mouth 2 (two) times daily.   gabapentin 100 MG capsule Commonly known as:   Neurontin Take 1 capsule (100 mg total) by mouth at bedtime for 30 days. What changed:    medication strength  how much to take   hydrALAZINE 50 MG tablet Commonly known as:  APRESOLINE Take 50 mg by mouth every 8 (eight) hours.   isosorbide mononitrate 60 MG 24 hr tablet Commonly known as:  IMDUR Take 60 mg by mouth daily.   montelukast 10 MG tablet Commonly known as:  SINGULAIR Take 10 mg by mouth at bedtime.   ondansetron 4 MG tablet Commonly known as:  Zofran Take 1 tablet (4 mg total) by mouth every 8 (eight) hours as needed for nausea or vomiting.   OxyCONTIN 10 mg 12 hr tablet Generic drug:  oxyCODONE Take 10 mg by mouth every 12 (twelve) hours.   sitaGLIPtin 100 MG tablet Commonly known as:  JANUVIA Take 100 mg by mouth daily.   Tyler Aas FlexTouch 200 UNIT/ML Sopn Generic drug:  Insulin Degludec Inject 14 Units into the skin at bedtime.   Vitamin D (Ergocalciferol) 1.25 MG (50000 UT) Caps capsule Commonly known as:  DRISDOL Take 50,000 Units by mouth every 7 (seven) days.   zolpidem 10 MG tablet Commonly known as:  AMBIEN Take 10 mg by mouth at bedtime.      Follow-up [facility]. Schedule an appointment as soon as possible for a visit in 1 week(s).   Contact information: [place] [phone]          No Known Allergies  Consultations:  Nephrology/PCCM   Procedures/Studies: Dg Chest Port 1 View  Result Date: 05/19/2018 CLINICAL DATA:  Respiratory distress, shortness of breath. EXAM: PORTABLE CHEST 1 VIEW COMPARISON:  05/18/2018 FINDINGS: Lungs are somewhat hypoinflated demonstrate hazy prominence of the perihilar markings suggesting mild vascular congestion. No focal lobar consolidation or effusion. Moderate stable cardiomegaly. Remainder of the exam is unchanged. IMPRESSION: Cardiomegaly with suggestion of mild vascular congestion. Electronically Signed   By: Marin Olp M.D.   On:  05/19/2018 08:27   Dg Chest Port 1 View  Result Date: 05/18/2018 CLINICAL DATA:  Respiratory failure EXAM: PORTABLE CHEST 1 VIEW COMPARISON:  05/17/2018 FINDINGS: Cardiac shadow is enlarged but stable. Aortic calcifications are again seen. Endotracheal tube and nasogastric catheter have been removed in the interval. Lungs are well aerated bilaterally. Improved aeration is noted bilaterally with only mild residual hazy opacities seen. Tiny left pleural effusion is noted. IMPRESSION: Interval removal of tubes and lines. Improved aeration is noted with tiny left pleural effusion. Electronically Signed   By: Inez Catalina M.D.   On: 05/18/2018 08:16   Dg Chest Port 1 View  Result Date: 05/17/2018 CLINICAL DATA:  Abnormal respiration EXAM: PORTABLE CHEST 1 VIEW COMPARISON:  05/16/2018 FINDINGS: Diffuse bilateral airspace disease has improved. Residual central and basilar airspace disease persists. Endotracheal and NG tubes stable. Cardiomegaly. Vascular congestion. No pneumothorax. IMPRESSION: Improved bilateral airspace disease. Electronically Signed   By: Marybelle Killings M.D.   On: 05/17/2018 07:29   Dg Chest Portable 1 View  Result Date: 05/16/2018 CLINICAL DATA:  Shortness of breath  EXAM: PORTABLE CHEST 1 VIEW COMPARISON:  None. FINDINGS: Endotracheal tube tip is at the level of the clavicular heads. Orogastric tube tip and side port are below the field of view. Moderate cardiomegaly with hazy bilateral opacities. No pneumothorax or sizable pleural effusion. IMPRESSION: 1. Radiographically appropriate position of endotracheal tube. 2. Cardiomegaly with bilateral hazy opacities which may indicate pulmonary edema or infection. Electronically Signed   By: Ulyses Jarred M.D.   On: 05/16/2018 04:24       Subjective: Patient seen and examined at bedside undergoing dialysis.  Denies overnight fever, nausea, vomiting or worsening shortness of breath..  Feels better and wants to go home today.  Discharge  Exam: Vitals:   05/20/18 1030 05/20/18 1100  BP: (!) 169/93 (!) 208/88  Pulse: 77 81  Resp: 15 14  Temp:    SpO2:     General exam: Appears calm and comfortable  Respiratory system: Bilateral decreased breath sounds at bases with scattered crackles Cardiovascular system: S1 & S2 heard, Rate controlled Gastrointestinal system: Abdomen is nondistended, soft and nontender. Normal bowel sounds heard. Extremities: No cyanosis, clubbing; lower extremity edema present    The results of significant diagnostics from this hospitalization (including imaging, microbiology, ancillary and laboratory) are listed below for reference.     Microbiology: Recent Results (from the past 240 hour(s))  Blood culture (routine x 2)     Status: None (Preliminary result)   Collection Time: 05/16/18  4:01 AM  Result Value Ref Range Status   Specimen Description BLOOD RIGHT ARM  Final   Special Requests   Final    BOTTLES DRAWN AEROBIC AND ANAEROBIC Blood Culture adequate volume   Culture   Final    NO GROWTH 4 DAYS Performed at [facility], [address], [place], [place] [address]    Report Status PENDING  Incomplete  Blood culture (routine x 2)     Status: None (Preliminary result)   Collection Time: 05/16/18  4:02 AM  Result Value Ref Range Status   Specimen Description BLOOD RIGHT HAND  Final   Special Requests   Final    BOTTLES DRAWN AEROBIC ONLY Blood Culture adequate volume   Culture   Final    NO GROWTH 4 DAYS Performed at [facility], [place] [address] [place], [place] [address]    Report Status PENDING  Incomplete  Novel Coronavirus, NAA (hospital order; send-out to ref lab)     Status: None   Collection Time: 05/16/18  4:03 AM  Result Value Ref Range Status   SARS-CoV-2, NAA NOT DETECTED NOT DETECTED Final    Comment: Performed at [place]  Final    Comment: Performed at [facility], [place] [address] [place], [place]  [address]  MRSA PCR Screening     Status: None   Collection Time: 05/16/18  7:17 AM  Result Value Ref Range Status   MRSA by PCR NEGATIVE NEGATIVE Final    Comment:        The GeneXpert MRSA Assay (FDA approved for NASAL specimens only), is one component of a comprehensive MRSA colonization surveillance program. It is not intended to diagnose MRSA infection nor to guide or monitor treatment for MRSA infections. Performed at [facility], [place] [address], [place],  [address]   Culture, respiratory (non-expectorated)     Status: None   Collection Time: 05/16/18  9:30 AM  Result Value Ref Range Status  Specimen Description TRACHEAL ASPIRATE  Final   Special Requests Normal  Final   Gram Stain   Final    RARE WBC PRESENT, PREDOMINANTLY MONONUCLEAR RARE GRAM POSITIVE COCCI IN PAIRS    Culture   Final    FEW Consistent with normal respiratory flora. Performed at [facility], [place] [address] [place], [place] [address]    Report Status 05/18/2018 FINAL  Final  Respiratory Panel by PCR     Status: None   Collection Time: 05/16/18 11:24 AM  Result Value Ref Range Status   Adenovirus NOT DETECTED NOT DETECTED Final   Coronavirus 229E NOT DETECTED NOT DETECTED Final    Comment: (NOTE) The Coronavirus on the Respiratory Panel, DOES NOT test for the novel  Coronavirus (2019 nCoV)    Coronavirus HKU1 NOT DETECTED NOT DETECTED Final   Coronavirus NL63 NOT DETECTED NOT DETECTED Final   Coronavirus OC43 NOT DETECTED NOT DETECTED Final   Metapneumovirus NOT DETECTED NOT DETECTED Final   Rhinovirus / Enterovirus NOT DETECTED NOT DETECTED Final   Influenza A NOT DETECTED NOT DETECTED Final   Influenza B NOT DETECTED NOT DETECTED Final   Parainfluenza Virus 1 NOT DETECTED NOT DETECTED Final   Parainfluenza Virus 2 NOT DETECTED NOT DETECTED Final   Parainfluenza Virus 3 NOT DETECTED NOT DETECTED Final   Parainfluenza Virus 4 NOT DETECTED NOT DETECTED Final   Respiratory  Syncytial Virus NOT DETECTED NOT DETECTED Final   Bordetella pertussis NOT DETECTED NOT DETECTED Final   Chlamydophila pneumoniae NOT DETECTED NOT DETECTED Final   Mycoplasma pneumoniae NOT DETECTED NOT DETECTED Final    Comment: Performed at [facility], [place] [address] [place], [place] [address]     Labs: BNP (last 3 results) Recent Labs    05/16/18 0400  BNP >1,497.0*   Basic Metabolic Panel: Recent Labs  Lab 05/16/18 0401  05/17/18 0344 05/17/18 0656 05/18/18 0307 05/19/18 0226 05/20/18 0243  NA 130*   < > 130* 130* 132* 132* 135  K 3.4*   < > 3.6 3.9 4.1 3.0* 2.8*  CL 92*  --   --  93* 91* 99 99  CO2 24  --   --  20* 26 25 26   GLUCOSE 142*  --   --  88 142* 111* 98  BUN 19  --   --  28* 35* 15 23*  CREATININE 5.79*  --   --  7.26* 8.44* 4.64* 6.10*  CALCIUM 8.5*  --   --  8.3* 7.8* 7.8* 8.2*  MG  --   --   --  2.1  --   --   --   PHOS  --   --   --  5.6*  --   --  4.4   < > = values in this interval not displayed.   Liver Function Tests: Recent Labs  Lab 05/16/18 0401 05/17/18 0656 05/20/18 0243  AST 27 21  --   ALT 14 11  --   ALKPHOS 144* 100  --   BILITOT 0.4 0.5  --   PROT 7.4 5.9*  --   ALBUMIN 3.0* 2.4* 2.2*   No results for input(s): LIPASE, AMYLASE in the last 168 hours. No results for input(s): AMMONIA in the last 168 hours. CBC: Recent Labs  Lab 05/16/18 0400  05/16/18 0951 05/17/18 0344 05/17/18 0940 05/18/18 1258 05/19/18 0226  WBC 11.2*  --   --   --  7.6 9.7 7.1  NEUTROABS  5.6  --   --   --  6.2  --   --   HGB 10.6*   < > 8.8* 12.9 8.5* 8.7* 8.2*  HCT 34.1*   < > 26.0* 38.0 25.8* 28.1* 26.3*  MCV 97.4  --   --   --  91.8 94.6 95.3  PLT 347  --   --   --  233 350 267   < > = values in this interval not displayed.   Cardiac Enzymes: Recent Labs  Lab 05/16/18 0400  TROPONINI <0.03   BNP: Invalid input(s): POCBNP CBG: Recent Labs  Lab 05/19/18 0608 05/19/18 0834 05/19/18 1130 05/19/18 1627 05/19/18 2106  GLUCAP  107* 76 110* 114* 134*   D-Dimer No results for input(s): DDIMER in the last 72 hours. Hgb A1c No results for input(s): HGBA1C in the last 72 hours. Lipid Profile No results for input(s): CHOL, HDL, LDLCALC, TRIG, CHOLHDL, LDLDIRECT in the last 72 hours. Thyroid function studies No results for input(s): TSH, T4TOTAL, T3FREE, THYROIDAB in the last 72 hours.  Invalid input(s): FREET3 Anemia work up No results for input(s): VITAMINB12, FOLATE, FERRITIN, TIBC, IRON, RETICCTPCT in the last 72 hours. Urinalysis No results found for: COLORURINE, APPEARANCEUR, [place], [place], [place], [place], [place], [place], PROTEINUR, UROBILINOGEN, NITRITE, LEUKOCYTESUR Sepsis Labs Invalid input(s): PROCALCITONIN,  WBC,  LACTICIDVEN Microbiology Recent Results (from the past 240 hour(s))  Blood culture (routine x 2)     Status: None (Preliminary result)   Collection Time: 05/16/18  4:01 AM  Result Value Ref Range Status   Specimen Description BLOOD RIGHT ARM  Final   Special Requests   Final    BOTTLES DRAWN AEROBIC AND ANAEROBIC Blood Culture adequate volume   Culture   Final    NO GROWTH 4 DAYS Performed at [facility], [place] [address] [place], [place] [address]    Report Status PENDING  Incomplete  Blood culture (routine x 2)     Status: None (Preliminary result)   Collection Time: 05/16/18  4:02 AM  Result Value Ref Range Status   Specimen Description BLOOD RIGHT HAND  Final   Special Requests   Final    BOTTLES DRAWN AEROBIC ONLY Blood Culture adequate volume   Culture   Final    NO GROWTH 4 DAYS Performed at [facility], [place] [address] [place], [place] [address]    Report Status PENDING  Incomplete  Novel Coronavirus, NAA (hospital order; send-out to ref lab)     Status: None   Collection Time: 05/16/18  4:03 AM  Result Value Ref Range Status   SARS-CoV-2, NAA NOT DETECTED NOT DETECTED Final    Comment: Performed at [place]  Final    Comment: Performed at [facility], [place] [address], [place], [place] [address]  MRSA PCR Screening     Status: None   Collection Time: 05/16/18  7:17 AM  Result Value Ref Range Status   MRSA by PCR NEGATIVE NEGATIVE Final    Comment:        The GeneXpert MRSA Assay (FDA approved for NASAL specimens only), is one component of a comprehensive MRSA colonization surveillance program. It is not intended to diagnose MRSA infection nor to guide or monitor treatment for MRSA infections. Performed at [facility], [place] [address], [place],  [address]   Culture, respiratory (non-expectorated)     Status: None   Collection Time: 05/16/18  9:30 AM  Result Value Ref Range Status   Specimen Description TRACHEAL ASPIRATE  Final   Special Requests Normal  Final   Gram Stain   Final    RARE WBC PRESENT, PREDOMINANTLY MONONUCLEAR RARE GRAM POSITIVE COCCI IN PAIRS    Culture   Final    FEW Consistent with normal respiratory flora. Performed at [facility], [place] [address], [place], [place] [address]    Report Status 05/18/2018 FINAL  Final  Respiratory Panel by PCR     Status: None   Collection Time: 05/16/18 11:24 AM  Result Value Ref Range Status   Adenovirus NOT DETECTED NOT DETECTED Final   Coronavirus 229E NOT DETECTED NOT DETECTED Final    Comment: (NOTE) The Coronavirus on the Respiratory Panel, DOES NOT test for the novel  Coronavirus (2019 nCoV)    Coronavirus HKU1 NOT DETECTED NOT DETECTED Final   Coronavirus NL63 NOT DETECTED NOT DETECTED Final   Coronavirus OC43 NOT DETECTED NOT DETECTED Final   Metapneumovirus NOT DETECTED NOT DETECTED Final   Rhinovirus / Enterovirus NOT DETECTED NOT DETECTED Final   Influenza A NOT DETECTED NOT DETECTED Final   Influenza B NOT DETECTED NOT DETECTED Final   Parainfluenza Virus 1 NOT DETECTED NOT DETECTED Final   Parainfluenza Virus 2 NOT DETECTED NOT DETECTED Final   Parainfluenza Virus 3  NOT DETECTED NOT DETECTED Final   Parainfluenza Virus 4 NOT DETECTED NOT DETECTED Final   Respiratory Syncytial Virus NOT DETECTED NOT DETECTED Final   Bordetella pertussis NOT DETECTED NOT DETECTED Final   Chlamydophila pneumoniae NOT DETECTED NOT DETECTED Final   Mycoplasma pneumoniae NOT DETECTED NOT DETECTED Final    Comment: Performed at [facility], [place] [address] [place], [place] [address]     Time coordinating discharge: 35 minutes  SIGNED:   Aline August, MD  [facility] 05/20/2018, 11:20 AM

## 2018-05-20 NOTE — Progress Notes (Signed)
[facility] Progress Note   Assessment/ Plan:   1. Acute resp failure - in patient recently started on dialysis 6 wks ago , gets HD in [facility] w/ [facility].  Bilat pulm infiltrates, RVP and COVID-19 negative, cultures negative so far, off antibiotics.  Extubated and in NAD.    2.  ESRD on HD MWF - started 6 wks ago.  HD today off schedule and then back to regular MWF tomorrow. Using 3K bath but K pretty low- will give a small amount of supplementation today esp since going to HD again tomorrow as OP.    3. DM2 on insulin  4. HTN - on 4 bp meds and still takes some lasix. Better with UF  5. H/o gout  6. H/o HL  7.  Tremor: Possibly d/t combination of solumedrol, missed HD, per pt gabapentin has this effect on her but hasn't had any here so anything would be PTA  Would recommend her decreasing gabapentin to 100 mg QHS--> pt reports she takes TID.  8.  Dispo: OK to go after HD  Subjective:    Got 2 16 g needles in today.  Access looks better.     Objective:   BP (!) 208/88   Pulse 81   Temp (!) 97 F (36.1 C) (Oral)   Resp 14   Ht 5\' 2"  (1.575 m)   Wt 77.1 kg   SpO2 99%   BMI 31.09 kg/m   Physical Exam: GEN older woman, NAD, sitting in chair HEENT EOMI PERRL NECK JVD PULM clear bilaterally CV RRR no m/rr/g ABD soft nontender NABS EXT no LE edema NEURO AAO x 3, no tremor SKIN no rashes or lesions ACCESS: LUE AVF + T/B-- infiltration noted.    Labs: BMET Recent Labs  Lab 05/16/18 0401 05/16/18 0533 05/16/18 9983 05/17/18 0344 05/17/18 0656 05/18/18 0307 05/19/18 0226 05/20/18 0243  NA 130* 132* 131* 130* 130* 132* 132* 135  K 3.4* 3.2* 3.4* 3.6 3.9 4.1 3.0* 2.8*  CL 92*  --   --   --  93* 91* 99 99  CO2 24  --   --   --  20* 26 25 26   GLUCOSE 142*  --   --   --  88 142* 111* 98  BUN 19  --   --   --  28* 35* 15 23*  CREATININE 5.79*  --   --   --  7.26* 8.44* 4.64* 6.10*  CALCIUM 8.5*  --   --   --  8.3* 7.8* 7.8* 8.2*  PHOS  --   --    --   --  5.6*  --   --  4.4   CBC Recent Labs  Lab 05/16/18 0400  05/17/18 0344 05/17/18 0940 05/18/18 1258 05/19/18 0226  WBC 11.2*  --   --  7.6 9.7 7.1  NEUTROABS 5.6  --   --  6.2  --   --   HGB 10.6*   < > 12.9 8.5* 8.7* 8.2*  HCT 34.1*   < > 38.0 25.8* 28.1* 26.3*  MCV 97.4  --   --  91.8 94.6 95.3  PLT 347  --   --  233 350 267   < > = values in this interval not displayed.    @IMGRELPRIORS @ Medications:    . albuterol  2.5 mg Nebulization BID  . allopurinol  150 mg Oral Daily  . amLODipine  10 mg Oral Daily  .  bisoprolol  20 mg Oral Daily  . Chlorhexidine Gluconate Cloth  6 each Topical Q0600  . cloNIDine  0.2 mg Oral BID  . ezetimibe  10 mg Oral Daily  . feeding supplement (NEPRO CARB STEADY)  237 mL Oral Daily  . furosemide  40 mg Oral BID  . guaiFENesin  600 mg Oral BID  . heparin injection (subcutaneous)  5,000 Units Subcutaneous Q8H  . insulin aspart  0-9 Units Subcutaneous TID WC  . mouth rinse  15 mL Mouth Rinse BID     Madelon Lips, MD [facility] pgr [phone] 05/20/2018, 11:25 AM

## 2018-05-20 NOTE — Progress Notes (Signed)
PT Cancellation Note  Patient Details Name: Jodi Bell MRN: [id number] DOB: 1960-12-22   Cancelled Treatment:    Reason Eval/Treat Not Completed: Patient at procedure or test/unavailable (HD). PT will continue to f/u with pt acutely as available.    [place] 05/20/2018, 8:21 AM

## 2018-05-21 LAB — CULTURE, BLOOD (ROUTINE X 2)
Culture: NO GROWTH
Culture: NO GROWTH
Special Requests: ADEQUATE
Special Requests: ADEQUATE

## 2018-09-14 ENCOUNTER — Emergency Department (HOSPITAL_BASED_OUTPATIENT_CLINIC_OR_DEPARTMENT_OTHER): Payer: Medicare PPO

## 2018-09-14 ENCOUNTER — Observation Stay (HOSPITAL_BASED_OUTPATIENT_CLINIC_OR_DEPARTMENT_OTHER)
Admission: EM | Admit: 2018-09-14 | Discharge: 2018-09-15 | Disposition: A | Discharge status: Home / Self Care | Payer: Medicare PPO | Attending: Internal Medicine | Admitting: Internal Medicine

## 2018-09-14 ENCOUNTER — Encounter (HOSPITAL_BASED_OUTPATIENT_CLINIC_OR_DEPARTMENT_OTHER): Payer: Self-pay

## 2018-09-14 ENCOUNTER — Other Ambulatory Visit: Payer: Self-pay

## 2018-09-14 DIAGNOSIS — I120 Hypertensive chronic kidney disease with stage 5 chronic kidney disease or end stage renal disease: Secondary | ICD-10-CM | POA: Diagnosis not present

## 2018-09-14 DIAGNOSIS — E1122 Type 2 diabetes mellitus with diabetic chronic kidney disease: Secondary | ICD-10-CM | POA: Insufficient documentation

## 2018-09-14 DIAGNOSIS — E669 Obesity, unspecified: Secondary | ICD-10-CM | POA: Insufficient documentation

## 2018-09-14 DIAGNOSIS — G8929 Other chronic pain: Secondary | ICD-10-CM | POA: Insufficient documentation

## 2018-09-14 DIAGNOSIS — R0602 Shortness of breath: Secondary | ICD-10-CM

## 2018-09-14 DIAGNOSIS — I10 Essential (primary) hypertension: Secondary | ICD-10-CM | POA: Diagnosis present

## 2018-09-14 DIAGNOSIS — J441 Chronic obstructive pulmonary disease with (acute) exacerbation: Secondary | ICD-10-CM | POA: Diagnosis present

## 2018-09-14 DIAGNOSIS — Z992 Dependence on renal dialysis: Secondary | ICD-10-CM | POA: Diagnosis not present

## 2018-09-14 DIAGNOSIS — I6782 Cerebral ischemia: Secondary | ICD-10-CM | POA: Diagnosis not present

## 2018-09-14 DIAGNOSIS — J9602 Acute respiratory failure with hypercapnia: Secondary | ICD-10-CM | POA: Diagnosis present

## 2018-09-14 DIAGNOSIS — M5021 Other cervical disc displacement,  high cervical region: Secondary | ICD-10-CM | POA: Diagnosis not present

## 2018-09-14 DIAGNOSIS — J42 Unspecified chronic bronchitis: Secondary | ICD-10-CM

## 2018-09-14 DIAGNOSIS — E8770 Fluid overload, unspecified: Principal | ICD-10-CM | POA: Insufficient documentation

## 2018-09-14 DIAGNOSIS — K219 Gastro-esophageal reflux disease without esophagitis: Secondary | ICD-10-CM | POA: Insufficient documentation

## 2018-09-14 DIAGNOSIS — E8779 Other fluid overload: Secondary | ICD-10-CM | POA: Diagnosis present

## 2018-09-14 DIAGNOSIS — I700 Atherosclerosis of aorta: Secondary | ICD-10-CM | POA: Diagnosis not present

## 2018-09-14 DIAGNOSIS — N185 Chronic kidney disease, stage 5: Secondary | ICD-10-CM

## 2018-09-14 DIAGNOSIS — N186 End stage renal disease: Secondary | ICD-10-CM | POA: Insufficient documentation

## 2018-09-14 DIAGNOSIS — Z7951 Long term (current) use of inhaled steroids: Secondary | ICD-10-CM | POA: Insufficient documentation

## 2018-09-14 DIAGNOSIS — Z87891 Personal history of nicotine dependence: Secondary | ICD-10-CM | POA: Insufficient documentation

## 2018-09-14 DIAGNOSIS — Z8249 Family history of ischemic heart disease and other diseases of the circulatory system: Secondary | ICD-10-CM | POA: Insufficient documentation

## 2018-09-14 DIAGNOSIS — Z7952 Long term (current) use of systemic steroids: Secondary | ICD-10-CM | POA: Insufficient documentation

## 2018-09-14 DIAGNOSIS — J449 Chronic obstructive pulmonary disease, unspecified: Secondary | ICD-10-CM | POA: Insufficient documentation

## 2018-09-14 DIAGNOSIS — M5000 Cervical disc disorder with myelopathy, unspecified cervical region: Secondary | ICD-10-CM | POA: Insufficient documentation

## 2018-09-14 DIAGNOSIS — G4733 Obstructive sleep apnea (adult) (pediatric): Secondary | ICD-10-CM | POA: Diagnosis not present

## 2018-09-14 DIAGNOSIS — Z20828 Contact with and (suspected) exposure to other viral communicable diseases: Secondary | ICD-10-CM | POA: Insufficient documentation

## 2018-09-14 DIAGNOSIS — J9601 Acute respiratory failure with hypoxia: Secondary | ICD-10-CM | POA: Insufficient documentation

## 2018-09-14 DIAGNOSIS — Z79899 Other long term (current) drug therapy: Secondary | ICD-10-CM | POA: Insufficient documentation

## 2018-09-14 DIAGNOSIS — G4700 Insomnia, unspecified: Secondary | ICD-10-CM | POA: Insufficient documentation

## 2018-09-14 DIAGNOSIS — E119 Type 2 diabetes mellitus without complications: Secondary | ICD-10-CM

## 2018-09-14 LAB — COMPREHENSIVE METABOLIC PANEL
ALT: 18 U/L (ref 0–44)
AST: 21 U/L (ref 15–41)
Albumin: 3.5 g/dL (ref 3.5–5.0)
Alkaline Phosphatase: 123 U/L (ref 38–126)
Anion gap: 11 (ref 5–15)
BUN: 24 mg/dL — ABNORMAL HIGH (ref 6–20)
CO2: 27 mmol/L (ref 22–32)
Calcium: 8.6 mg/dL — ABNORMAL LOW (ref 8.9–10.3)
Chloride: 98 mmol/L (ref 98–111)
Creatinine, Ser: 5.23 mg/dL — ABNORMAL HIGH (ref 0.44–1.00)
GFR calc Af Amer: 10 mL/min — ABNORMAL LOW (ref >60)
GFR calc non Af Amer: 8 mL/min — ABNORMAL LOW (ref >60)
Glucose, Bld: 149 mg/dL — ABNORMAL HIGH (ref 70–99)
Potassium: 5 mmol/L (ref 3.5–5.1)
Sodium: 136 mmol/L (ref 135–145)
Total Bilirubin: 0.7 mg/dL (ref 0.3–1.2)
Total Protein: 6.8 g/dL (ref 6.5–8.1)

## 2018-09-14 LAB — SARS CORONAVIRUS 2 BY RT PCR (HOSPITAL ORDER, PERFORMED IN ~~LOC~~ HOSPITAL LAB): SARS Coronavirus 2: NEGATIVE

## 2018-09-14 LAB — CBC WITH DIFFERENTIAL/PLATELET
Abs Immature Granulocytes: 0.02 10*3/uL (ref 0.00–0.07)
Basophils Absolute: 0 10*3/uL (ref 0.0–0.1)
Basophils Relative: 0 %
Eosinophils Absolute: 0.2 10*3/uL (ref 0.0–0.5)
Eosinophils Relative: 3 %
HCT: 35.1 % — ABNORMAL LOW (ref 36.0–46.0)
Hemoglobin: 10.7 g/dL — ABNORMAL LOW (ref 12.0–15.0)
Immature Granulocytes: 0 %
Lymphocytes Relative: 7 %
Lymphs Abs: 0.5 10*3/uL — ABNORMAL LOW (ref 0.7–4.0)
MCH: 27.4 pg (ref 26.0–34.0)
MCHC: 30.5 g/dL (ref 30.0–36.0)
MCV: 89.8 fL (ref 80.0–100.0)
Monocytes Absolute: 0.6 10*3/uL (ref 0.1–1.0)
Monocytes Relative: 8 %
Neutro Abs: 5.9 10*3/uL (ref 1.7–7.7)
Neutrophils Relative %: 82 %
Platelets: 211 10*3/uL (ref 150–400)
RBC: 3.91 MIL/uL (ref 3.87–5.11)
RDW: 18.5 % — ABNORMAL HIGH (ref 11.5–15.5)
WBC: 7.2 10*3/uL (ref 4.0–10.5)
nRBC: 0 % (ref 0.0–0.2)

## 2018-09-14 LAB — GLUCOSE, CAPILLARY: Glucose-Capillary: 115 mg/dL — ABNORMAL HIGH (ref 70–99)

## 2018-09-14 LAB — TROPONIN I (HIGH SENSITIVITY): Troponin I (High Sensitivity): 12 ng/L (ref <18)

## 2018-09-14 MED ORDER — FLUTICASONE PROPIONATE 50 MCG/ACT NA SUSP
1.0000 | Freq: Every day | NASAL | Status: DC | PRN
  Filled 2018-09-14: qty 16

## 2018-09-14 MED ORDER — FERROUS SULFATE 325 (65 FE) MG PO TABS
325.0000 mg | ORAL_TABLET | Freq: Every day | ORAL | Status: DC
  Administered 2018-09-15: 325 mg via ORAL
  Filled 2018-09-14: qty 1

## 2018-09-14 MED ORDER — ACETAMINOPHEN 650 MG RE SUPP: 650.0000 mg | Freq: Four times a day (QID) | RECTAL | Status: DC | PRN

## 2018-09-14 MED ORDER — DIPHENHYDRAMINE HCL 50 MG/ML IJ SOLN
12.5000 mg | Freq: Once | INTRAMUSCULAR | Status: AC
  Administered 2018-09-14: 12.5 mg via INTRAVENOUS
  Filled 2018-09-14: qty 1

## 2018-09-14 MED ORDER — HYDRALAZINE HCL 50 MG PO TABS
100.0000 mg | ORAL_TABLET | Freq: Three times a day (TID) | ORAL | Status: DC
  Administered 2018-09-14 – 2018-09-15 (×2): 100 mg via ORAL
  Filled 2018-09-14 (×2): qty 2

## 2018-09-14 MED ORDER — MOMETASONE FURO-FORMOTEROL FUM 200-5 MCG/ACT IN AERO
2.0000 | INHALATION_SPRAY | Freq: Two times a day (BID) | RESPIRATORY_TRACT | Status: DC
  Filled 2018-09-14: qty 8.8

## 2018-09-14 MED ORDER — PREDNISONE 5 MG PO TABS
5.0000 mg | ORAL_TABLET | Freq: Every day | ORAL | Status: DC
  Administered 2018-09-15: 5 mg via ORAL
  Filled 2018-09-14: qty 1

## 2018-09-14 MED ORDER — AMLODIPINE BESYLATE 10 MG PO TABS
10.0000 mg | ORAL_TABLET | Freq: Every day | ORAL | Status: DC
  Administered 2018-09-15: 10 mg via ORAL
  Filled 2018-09-14: qty 1

## 2018-09-14 MED ORDER — OXYCODONE HCL 5 MG PO TABS: 10.0000 mg | ORAL_TABLET | Freq: Four times a day (QID) | ORAL | Status: DC | PRN

## 2018-09-14 MED ORDER — UMECLIDINIUM BROMIDE 62.5 MCG/INH IN AEPB
1.0000 | INHALATION_SPRAY | Freq: Every day | RESPIRATORY_TRACT | Status: DC
  Filled 2018-09-14: qty 7

## 2018-09-14 MED ORDER — FUROSEMIDE 40 MG PO TABS
40.0000 mg | ORAL_TABLET | Freq: Two times a day (BID) | ORAL | Status: DC
  Administered 2018-09-15: 40 mg via ORAL
  Filled 2018-09-14: qty 1

## 2018-09-14 MED ORDER — ONDANSETRON HCL 4 MG/2ML IJ SOLN: 4.0000 mg | Freq: Four times a day (QID) | INTRAMUSCULAR | Status: DC | PRN

## 2018-09-14 MED ORDER — ZOLPIDEM TARTRATE 5 MG PO TABS
5.0000 mg | ORAL_TABLET | Freq: Every day | ORAL | Status: DC
  Administered 2018-09-14: 5 mg via ORAL
  Filled 2018-09-14: qty 1

## 2018-09-14 MED ORDER — METHOCARBAMOL 500 MG PO TABS
500.0000 mg | ORAL_TABLET | Freq: Two times a day (BID) | ORAL | Status: DC
  Administered 2018-09-14 – 2018-09-15 (×2): 500 mg via ORAL
  Filled 2018-09-14 (×2): qty 1

## 2018-09-14 MED ORDER — CLONIDINE HCL 0.3 MG PO TABS
0.3000 mg | ORAL_TABLET | Freq: Two times a day (BID) | ORAL | Status: DC
  Administered 2018-09-15: 0.3 mg via ORAL
  Filled 2018-09-14: qty 1

## 2018-09-14 MED ORDER — CLONIDINE HCL 0.1 MG PO TABS
0.2000 mg | ORAL_TABLET | Freq: Once | ORAL | Status: AC
  Administered 2018-09-14: 0.2 mg via ORAL
  Filled 2018-09-14: qty 2

## 2018-09-14 MED ORDER — ONDANSETRON HCL 4 MG PO TABS: 4.0000 mg | ORAL_TABLET | Freq: Four times a day (QID) | ORAL | Status: DC | PRN

## 2018-09-14 MED ORDER — MONTELUKAST SODIUM 10 MG PO TABS
10.0000 mg | ORAL_TABLET | Freq: Every day | ORAL | Status: DC
  Administered 2018-09-14: 10 mg via ORAL
  Filled 2018-09-14: qty 1

## 2018-09-14 MED ORDER — LOSARTAN POTASSIUM 50 MG PO TABS
100.0000 mg | ORAL_TABLET | Freq: Every day | ORAL | Status: DC
  Administered 2018-09-15: 100 mg via ORAL
  Filled 2018-09-14: qty 2

## 2018-09-14 MED ORDER — GABAPENTIN 300 MG PO CAPS: 300.0000 mg | ORAL_CAPSULE | Freq: Every evening | ORAL | Status: DC | PRN

## 2018-09-14 MED ORDER — BISOPROLOL FUMARATE 5 MG PO TABS
20.0000 mg | ORAL_TABLET | Freq: Every day | ORAL | Status: DC
  Administered 2018-09-15: 20 mg via ORAL
  Filled 2018-09-14: qty 4

## 2018-09-14 MED ORDER — LINAGLIPTIN 5 MG PO TABS
5.0000 mg | ORAL_TABLET | Freq: Every day | ORAL | Status: DC
  Administered 2018-09-15: 5 mg via ORAL
  Filled 2018-09-14: qty 1

## 2018-09-14 MED ORDER — FOLIC ACID 1 MG PO TABS
500.0000 ug | ORAL_TABLET | Freq: Every day | ORAL | Status: DC
  Administered 2018-09-15: 0.5 mg via ORAL
  Filled 2018-09-14: qty 1

## 2018-09-14 MED ORDER — ISOSORBIDE MONONITRATE ER 60 MG PO TB24
60.0000 mg | ORAL_TABLET | Freq: Every day | ORAL | Status: DC
  Administered 2018-09-15: 60 mg via ORAL
  Filled 2018-09-14: qty 1

## 2018-09-14 MED ORDER — PROCHLORPERAZINE EDISYLATE 10 MG/2ML IJ SOLN
10.0000 mg | Freq: Once | INTRAMUSCULAR | Status: AC
  Administered 2018-09-14: 10 mg via INTRAVENOUS
  Filled 2018-09-14: qty 2

## 2018-09-14 MED ORDER — ALBUTEROL SULFATE (2.5 MG/3ML) 0.083% IN NEBU: 3.0000 mL | INHALATION_SOLUTION | Freq: Four times a day (QID) | RESPIRATORY_TRACT | Status: DC | PRN

## 2018-09-14 MED ORDER — LORATADINE 10 MG PO TABS
10.0000 mg | ORAL_TABLET | Freq: Every day | ORAL | Status: DC
  Administered 2018-09-15: 10 mg via ORAL
  Filled 2018-09-14: qty 1

## 2018-09-14 MED ORDER — ACETAMINOPHEN 325 MG PO TABS
650.0000 mg | ORAL_TABLET | Freq: Four times a day (QID) | ORAL | Status: DC | PRN
  Administered 2018-09-15: 09:00:00 650 mg via ORAL

## 2018-09-14 MED ORDER — CHLORHEXIDINE GLUCONATE CLOTH 2 % EX PADS
6.0000 | MEDICATED_PAD | Freq: Every day | CUTANEOUS | Status: DC
  Administered 2018-09-15: 6 via TOPICAL

## 2018-09-14 MED ORDER — HEPARIN SODIUM (PORCINE) 5000 UNIT/ML IJ SOLN
5000.0000 [IU] | Freq: Three times a day (TID) | INTRAMUSCULAR | Status: DC
  Administered 2018-09-14 – 2018-09-15 (×3): 5000 [IU] via SUBCUTANEOUS
  Filled 2018-09-14 (×3): qty 1

## 2018-09-14 MED ORDER — ALLOPURINOL 100 MG PO TABS
100.0000 mg | ORAL_TABLET | Freq: Every day | ORAL | Status: DC
  Administered 2018-09-15: 100 mg via ORAL
  Filled 2018-09-14: qty 1

## 2018-09-14 MED ORDER — PANTOPRAZOLE SODIUM 40 MG PO TBEC
40.0000 mg | DELAYED_RELEASE_TABLET | Freq: Every day | ORAL | Status: DC
  Administered 2018-09-14: 40 mg via ORAL
  Filled 2018-09-14: qty 1

## 2018-09-14 NOTE — ED Notes (Signed)
Lawrence (Pt Son) [phone]

## 2018-09-14 NOTE — ED Notes (Signed)
Pt son lawrence updated with plan of care.

## 2018-09-14 NOTE — ED Triage Notes (Signed)
Pt c/o elevated BP x 1-2 weeks-c/o SOB that started last night-pt states she has dialysis this am-normal schedule in M-W-F-pt states she did not go yesterday-states [facility] where [facility] "of my neck" due to "stiff neck c/o yesterday-pt c/o HA

## 2018-09-14 NOTE — ED Notes (Signed)
Pt reports having to stop dialysis today due to severe cramping- pt also reports dizziness. Pt SOB with exertion. Pt states "this feels like when my CHF acts up". Pt reports severe HA with light sensitivity. No N/V. Pt  originally 85% RA when brought back to room, placed on 2L [place] and SpO2 increased to 98%.

## 2018-09-14 NOTE — ED Notes (Signed)
ED Provider at bedside. 

## 2018-09-14 NOTE — ED Provider Notes (Signed)
[facility] Provider Note   CSN: [id number] Arrival date & time: 09/14/18  1421    History   Chief Complaint Chief Complaint  Patient presents with   Hypertension   Shortness of Breath    HPI Jodi Bell is a 58 y.o. female.     HPI   Patient is a 58 year old female with a history of ESRD (on dialysis MWF, [facility]), COPD, diabetes, hypertension, GERD, who presents to the emergency department today complaining of hypertension and shortness of breath. She is also c/o a headache.   Headache: States that HA started today at 10am. States HA woke her out of sleep. Pain is located on the right frontal aspect behind her eye. Denies vision changes or vomiting. Has been nauseated and has sensitivity to light. Has been constant since onset. Pain rated 9/10.   SOB: Started having SOB last night. Has been using inhalers but they have not improved her symptoms.  Denies chest pain, cough, or fevers. States her sxs feel similar to when she had a heart failure exacerbation in the past. States her weight was up 11 lbs from last dialysis (Friday). She still makes some urine.  States she supposed to go to dialysis Monday, Wednesday, Friday.  She skipped yesterday's session and was able to be worked in today.  She normally goes for 4 hours but only went for 3 hours today because she was cramping so she requested to be stopped.  Past Medical History:  Diagnosis Date   Anemia    Arthritis    oa and psoriatic ra   Asthma    Back pain, chronic    lower back   Candida infection finished tx 2 days ago   Chronic insomnia    Chronic kidney disease    stage 3 ckd last ov nephrology 04-29-17 epic   Chronic kidney disease (CKD) stage G4/A1, severely decreased glomerular filtration rate (GFR) between 15-29 mL/min/1.73 square meter and albuminuria creatinine ratio less than 30 mg/g (HCC)    COPD (chronic obstructive pulmonary disease) ([place])     Diabetes mellitus    type 2   Eczema    Essential hypertension    GERD (gastroesophageal reflux disease)    History of hiatal hernia    Obesity    Pneumonia few yrs ago x 2    Patient Active Problem List   Diagnosis Date Noted   Volume overload state of heart 09/14/2018   Asthma 05/16/2018   Suspected Covid-19 Virus Infection 05/16/2018   ESRD (end stage renal disease) ([place]) 05/16/2018   Fluid overload 02/27/2018   Chest pain, rule out acute myocardial infarction 12/12/2017   Status post left partial knee replacement 11/12/2017   Hematochezia 09/20/2017   GERD (gastroesophageal reflux disease) 09/20/2017   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 2 diabetes mellitus ([place]) 09/20/2017   COPD (chronic obstructive pulmonary disease) ([place]) 09/20/2017   Tobacco use 09/20/2017   Symptomatic anemia 09/20/2017   Headache 09/20/2017   Chronic kidney disease (CKD) stage G4/A1, severely decreased glomerular filtration rate (GFR) between 15-29 mL/min/1.73 square meter and albuminuria creatinine ratio less than 30 mg/g (HCC)    Serotonin syndrome 09/28/2013   Respiratory failure ([place]) 09/28/2013   Encephalopathy acute 09/28/2013   Hypotension, unspecified 09/28/2013   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   Type 2 diabetes mellitus ([place]) 03/06/2008   INSOMNIA, CHRONIC 03/06/2008   Essential hypertension 03/06/2008   ASTHMA 03/06/2008   ECZEMA 03/06/2008   BACK PAIN,  LUMBAR, CHRONIC 03/06/2008    Past Surgical History:  Procedure Laterality Date   BACK SURGERY  2016   lower back fusion with cage   BIOPSY  09/23/2017   Procedure: BIOPSY;  Surgeon: Wonda Horner, MD;  Location: [facility];  Service: Endoscopy;;   CESAREAN SECTION  1990   x 1    COLONOSCOPY WITH PROPOFOL N/A 09/23/2017   Procedure: COLONOSCOPY WITH PROPOFOL Hemostatic clips placed;  Surgeon: Wonda Horner, MD;  Location: [facility];  Service: Endoscopy;  Laterality: N/A;   DILATION AND  CURETTAGE OF UTERUS  1988   ESOPHAGOGASTRODUODENOSCOPY (EGD) WITH PROPOFOL N/A 09/23/2017   Procedure: ESOPHAGOGASTRODUODENOSCOPY (EGD) WITH PROPOFOL;  Surgeon: Wonda Horner, MD;  Location: [facility];  Service: Endoscopy;  Laterality: N/A;   PARTIAL KNEE ARTHROPLASTY Left 11/12/2017   Procedure: left unicompartmental arthroplasty-medial;  Surgeon: Paralee Cancel, MD;  Location: [facility];  Service: Orthopedics;  Laterality: Left;  68min   SUBMUCOSAL INJECTION  09/23/2017   Procedure: SUBMUCOSAL INJECTION;  Surgeon: Wonda Horner, MD;  Location: [facility];  Service: Endoscopy;;  in colon   [place]     OB History   No obstetric history on file.      Home Medications    Prior to Admission medications   Medication Sig Start Date End Date Taking? Authorizing Provider  allopurinol (ZYLOPRIM) 300 MG tablet Take 150 mg by mouth daily.     [provider]  allopurinol (ZYLOPRIM) 300 MG tablet Take 150-300 mg by mouth daily.    [provider]  amLODipine (NORVASC) 10 MG tablet Take 10 mg by mouth daily.    [provider]  amLODipine (NORVASC) 10 MG tablet Take 10 mg by mouth daily.    [provider]  benzonatate (TESSALON PERLES) 100 MG capsule Take 1 capsule (100 mg total) by mouth 3 (three) times daily as needed for cough. 03/01/18 03/01/19  Thurnell Lose, MD  bisoprolol (ZEBETA) 10 MG tablet Take 2 tablets (20 mg total) by mouth daily. 12/14/17   Dana Allan I, MD  bisoprolol (ZEBETA) 10 MG tablet Take 20 mg by mouth daily.    [provider]  budesonide-formoterol (SYMBICORT) 160-4.5 MCG/ACT inhaler Inhale 2 puffs into the lungs 2 (two) times daily.    [provider]  busPIRone (BUSPAR) 5 MG tablet Take 5 mg by mouth 2 (two) times daily.    [provider]  cetirizine (ZYRTEC) 10 MG tablet Take 20 mg by mouth 2 (two) times daily.  06/13/17   Shelda Pal, DO  cloNIDine (CATAPRES) 0.2 MG  tablet Take 0.2 mg by mouth 2 (two) times daily.  07/15/15   [provider]  cloNIDine (CATAPRES) 0.2 MG tablet Take 0.2 mg by mouth 2 (two) times daily. 02/04/18   [provider]  EPINEPHrine (EPIPEN) 0.3 mg/0.3 mL SOAJ injection Inject 0.3 mLs (0.3 mg total) into the muscle once. Patient taking differently: Inject 0.3 mg into the muscle as needed (for allergic reaction).  09/30/12   Le, Thao P, DO  esomeprazole (NEXIUM) 20 MG capsule Take 20 mg by mouth every evening.     [provider]  ezetimibe (ZETIA) 10 MG tablet TAKE 1 TABLET BY MOUTH EVERY DAY 03/22/18   Adrian Prows, MD  ezetimibe (ZETIA) 10 MG tablet Take 10 mg by mouth daily. 12/25/17   [provider]  ferrous sulfate 325 (65 FE) MG tablet Take 325 mg by mouth daily.  [provider]  fluticasone (FLONASE) 50 MCG/ACT nasal spray Place 1 spray into both nostrils daily as needed for allergies or rhinitis.     [provider]  folic acid (FOLVITE) 465 MCG tablet Take 400 mcg by mouth daily.     [provider]  furosemide (LASIX) 40 MG tablet Take 40 mg by mouth 2 (two) times daily.     [provider]  furosemide (LASIX) 40 MG tablet Take 2 tablets (80 mg total) by mouth 2 (two) times daily. 03/01/18   Thurnell Lose, MD  furosemide (LASIX) 40 MG tablet Take 40 mg by mouth 2 (two) times daily.    [provider]  gabapentin (NEURONTIN) 100 MG capsule Take 1 capsule (100 mg total) by mouth at bedtime for 30 days. 05/20/18 06/19/18  Aline August, MD  gabapentin (NEURONTIN) 300 MG capsule Take 1 capsule (300 mg total) by mouth at bedtime as needed (Neuropathic pain). 12/14/17 12/14/18  Dana Allan I, MD  hydrALAZINE (APRESOLINE) 50 MG tablet Take 1 tablet (50 mg total) by mouth every 8 (eight) hours. 12/14/17   Dana Allan I, MD  hydrALAZINE (APRESOLINE) 50 MG tablet Take 50 mg by mouth every 8 (eight) hours.    [provider]  Insulin  Degludec (TRESIBA FLEXTOUCH) 200 UNIT/ML SOPN Inject 14 Units into the skin every evening.     [provider]  Insulin Degludec (TRESIBA FLEXTOUCH) 200 UNIT/ML SOPN Inject 14 Units into the skin at bedtime.    [provider]  isosorbide mononitrate (IMDUR) 60 MG 24 hr tablet Take 1 tablet (60 mg total) by mouth daily. 03/02/18   Thurnell Lose, MD  isosorbide mononitrate (IMDUR) 60 MG 24 hr tablet Take 60 mg by mouth daily.    [provider]  JANUVIA 100 MG tablet Take 100 mg by mouth daily.  08/09/15   [provider]  montelukast (SINGULAIR) 10 MG tablet Take 10 mg by mouth at bedtime.    [provider]  montelukast (SINGULAIR) 10 MG tablet Take 10 mg by mouth at bedtime. 04/04/18   [provider]  Multiple Vitamin (MULTIVITAMIN) tablet Take 1 tablet by mouth daily.    [provider]  ondansetron (ZOFRAN) 4 MG tablet Take 1 tablet (4 mg total) by mouth every 8 (eight) hours as needed for nausea or vomiting. 05/20/18 05/20/19  Aline August, MD  oxyCODONE (OXYCONTIN) 10 mg 12 hr tablet Take 10 mg by mouth every 12 (twelve) hours.    [provider]  oxyCODONE 10 MG TABS Take 1-2 tablets (10-20 mg total) by mouth every 6 (six) hours as needed for severe pain (pain score 7-10). Patient taking differently: Take 10 mg by mouth every 6 (six) hours as needed for severe pain (pain score 7-10).  11/13/17   Constable, Amber, PA-C  potassium chloride (K-DUR) 10 MEQ tablet Take 1 tablet (10 mEq total) by mouth daily. 03/01/18   Thurnell Lose, MD  predniSONE (DELTASONE) 2.5 MG tablet Take 2.5 mg by mouth daily with breakfast.    [provider]  sitaGLIPtin (JANUVIA) 100 MG tablet Take 100 mg by mouth daily.    [provider]  sodium bicarbonate 650 MG tablet Take 650 mg by mouth 2 (two) times daily. 02/17/18   [provider]  VENTOLIN HFA 108 (90 Base) MCG/ACT inhaler Inhale 1-2 puffs into the lungs every 6  (six) hours as needed for shortness of breath. wheezing 06/13/15   [provider]  Vitamin D, Ergocalciferol, (DRISDOL) 1.25 MG (50000 UT) CAPS capsule Take 50,000 Units by mouth every 7 (seven) days.    [provider]  Vitamin D, Ergocalciferol, (DRISDOL) 50000 units CAPS capsule Take 50,000 Units by mouth every Tuesday.  09/15/17   [provider]  zolpidem (AMBIEN) 10 MG tablet Take 10 mg by mouth at bedtime.     [provider]  zolpidem (AMBIEN) 10 MG tablet Take 10 mg by mouth at bedtime.    [provider]    Family History Family History  Problem Relation Age of Onset   Lung cancer Mother    Diabetes Sister    Heart disease Sister    Thyroid cancer Other        1/2 sister   Heart disease Other        1/2 sister    Social History Social History   Tobacco Use   Smoking status: Former Smoker    Packs/day: 0.50    Years: 40.00    Pack years: 20.00    Types: Cigarettes    Quit date: 05/15/2018    Years since quitting: 0.3   Smokeless tobacco: Never Used  Substance Use Topics   Alcohol use: Yes    Comment: occ   Drug use: No     Allergies   Adalimumab, Allyl isothiocyanate, Banana, Ezetimibe, Ibuprofen, Leflunomide and related, Lexapro [escitalopram oxalate], Lisinopril, Other, Pistachio nut (diagnostic), Statins, and Trazodone and nefazodone   Review of Systems Review of Systems  Constitutional: Negative for chills and fever.  HENT: Negative for ear pain and sore throat.   Eyes: Positive for photophobia. Negative for pain and visual disturbance.  Respiratory: Positive for shortness of breath. Negative for cough.   Cardiovascular: Positive for leg swelling. Negative for chest pain.  Gastrointestinal: Negative for abdominal pain, constipation, diarrhea, nausea and vomiting.  Genitourinary: Negative for dysuria and hematuria.  Musculoskeletal: Negative for back pain.  Skin: Negative for rash.  Neurological:  Positive for headaches. Negative for dizziness, weakness, light-headedness and numbness.  All other systems reviewed and are negative.    Physical Exam Updated Vital Signs BP (!) 150/104    Pulse 70    Temp 99.3 F (37.4 C) (Oral)    Resp 17    SpO2 96%   Physical Exam Vitals signs and nursing note reviewed.  Constitutional:      General: She is not in acute distress.    Appearance: She is well-developed.  HENT:     Head: Normocephalic and atraumatic.  Eyes:     Conjunctiva/sclera: Conjunctivae normal.  Neck:     Musculoskeletal: Neck supple.  Cardiovascular:     Rate and Rhythm: Normal rate and regular rhythm.     Heart sounds: No murmur.  Pulmonary:     Effort: Pulmonary effort is normal. No respiratory distress.     Breath sounds: Examination of the right-upper field reveals wheezing. Examination of the left-upper field reveals decreased breath sounds and wheezing. Examination of the right-middle field reveals rales. Examination of the left-middle field reveals rales. Examination of the right-lower field reveals rales. Examination of the left-lower field reveals decreased breath sounds and rales. Decreased breath sounds, wheezing and rales present.  Abdominal:     Palpations: Abdomen is soft.     Tenderness: There is no abdominal tenderness.  Skin:    General: Skin is warm and dry.  Neurological:     Mental Status: She is alert.     ED Treatments / Results  Labs (all labs ordered are listed, but only abnormal results are displayed) Labs Reviewed  CBC WITH DIFFERENTIAL/PLATELET - Abnormal; Notable for the following components:      Result Value   Hemoglobin 10.7 (*)    HCT 35.1 (*)    RDW 18.5 (*)    Lymphs Abs 0.5 (*)    All other components within normal limits  COMPREHENSIVE METABOLIC PANEL - Abnormal; Notable for the following components:   Glucose, Bld 149 (*)    BUN 24 (*)    Creatinine, Ser 5.23 (*)    Calcium 8.6 (*)    GFR calc non Af Amer 8 (*)    GFR  calc Af Amer 10 (*)    All other components within normal limits  SARS CORONAVIRUS 2 (HOSPITAL ORDER, [facility])  GLUCOSE, CAPILLARY  TROPONIN I (HIGH SENSITIVITY)    EKG EKG Interpretation  Date/Time:  Tuesday September 14 2018 14:43:30 EDT Ventricular Rate:  80 PR Interval:    QRS Duration: 86 QT Interval:  386 QTC Calculation: 446 R Axis:   -2 Text Interpretation:  Sinus rhythm Probable left atrial enlargement Confirmed by Quintella Reichert [phone]) on 09/14/2018 3:02:14 PM   Radiology Dg Chest 1 View  Result Date: 09/14/2018 CLINICAL DATA:  Elevated blood pressure and shortness of breath. EXAM: CHEST  1 VIEW COMPARISON:  April 21, 2018 FINDINGS: Enlarged cardiac silhouette. Calcific atherosclerotic disease of the aorta. Mixed pattern pulmonary edema. No pleural effusions seen. Osseous structures are without acute abnormality. Soft tissues are grossly normal. IMPRESSION: Enlarged cardiac silhouette with mixed pattern pulmonary edema. Electronically Signed   By: Fidela Salisbury M.D.   On: 09/14/2018 15:25   Ct Head Wo Contrast  Result Date: 09/14/2018 CLINICAL DATA:  Headache, stiff neck, worst headache of life, elevated blood pressure for 1-2 weeks, shortness of breath since last night, dialysis this morning EXAM: CT HEAD WITHOUT CONTRAST CT CERVICAL SPINE WITHOUT CONTRAST TECHNIQUE: Multidetector CT imaging of the head and cervical spine was performed following the standard protocol without intravenous contrast. Multiplanar CT image reconstructions of the cervical spine were also generated. COMPARISON:  CT head 08/17/2015, cervical spine radiographs of 09/13/2018 FINDINGS: CT HEAD FINDINGS Brain: Normal ventricular morphology. No midline shift or mass effect. Small vessel chronic ischemic changes of deep cerebral white matter. No intracranial hemorrhage, mass lesion, or evidence of acute infarction. No extra-axial fluid collections. Few scattered dural  calcifications at falx Vascular: Atherosclerotic calcifications of internal carotid arteries bilaterally at skull base. No hyperdense vessels. Skull: Intact Sinuses/Orbits: Clear Other: N/A CT CERVICAL SPINE FINDINGS Alignment: Normal Skull base and vertebrae: Low normal osseous mineralization. Disc space narrowing and endplate spur formation at C4-C5 and C5-C6. Vertebral body heights maintained without fracture or bone, subluxation or bone destruction. Soft tissues and spinal canal: Prevertebral soft tissues normal thickness. Remaining cervical soft tissues grossly unremarkable. Atherosclerotic calcifications of the carotid bifurcations noted. Disc levels: Bulging disc at C4-C5. Probable central disc protrusion at C3-C4, potentially exerting mass effect upon spinal cord. Upper chest: Tip of LEFT lung apex clear. Other: N/A IMPRESSION: Small vessel chronic ischemic changes of deep cerebral white matter. No acute intracranial abnormalities. Degenerative disc disease changes of the cervical spine primarily at C4-C5 and C5-C6. Probable central disc protrusion at C3-C4, potentially exerting mass effect upon the spinal cord; this could be better evaluated by MR imaging of the cervical spine without contrast. Electronically Signed   By: Lavonia Dana M.D.   On: 09/14/2018 15:30  Ct Cervical Spine Wo Contrast  Result Date: 09/14/2018 CLINICAL DATA:  Headache, stiff neck, worst headache of life, elevated blood pressure for 1-2 weeks, shortness of breath since last night, dialysis this morning EXAM: CT HEAD WITHOUT CONTRAST CT CERVICAL SPINE WITHOUT CONTRAST TECHNIQUE: Multidetector CT imaging of the head and cervical spine was performed following the standard protocol without intravenous contrast. Multiplanar CT image reconstructions of the cervical spine were also generated. COMPARISON:  CT head 08/17/2015, cervical spine radiographs of 09/13/2018 FINDINGS: CT HEAD FINDINGS Brain: Normal ventricular morphology. No  midline shift or mass effect. Small vessel chronic ischemic changes of deep cerebral white matter. No intracranial hemorrhage, mass lesion, or evidence of acute infarction. No extra-axial fluid collections. Few scattered dural calcifications at falx Vascular: Atherosclerotic calcifications of internal carotid arteries bilaterally at skull base. No hyperdense vessels. Skull: Intact Sinuses/Orbits: Clear Other: N/A CT CERVICAL SPINE FINDINGS Alignment: Normal Skull base and vertebrae: Low normal osseous mineralization. Disc space narrowing and endplate spur formation at C4-C5 and C5-C6. Vertebral body heights maintained without fracture or bone, subluxation or bone destruction. Soft tissues and spinal canal: Prevertebral soft tissues normal thickness. Remaining cervical soft tissues grossly unremarkable. Atherosclerotic calcifications of the carotid bifurcations noted. Disc levels: Bulging disc at C4-C5. Probable central disc protrusion at C3-C4, potentially exerting mass effect upon spinal cord. Upper chest: Tip of LEFT lung apex clear. Other: N/A IMPRESSION: Small vessel chronic ischemic changes of deep cerebral white matter. No acute intracranial abnormalities. Degenerative disc disease changes of the cervical spine primarily at C4-C5 and C5-C6. Probable central disc protrusion at C3-C4, potentially exerting mass effect upon the spinal cord; this could be better evaluated by MR imaging of the cervical spine without contrast. Electronically Signed   By: Lavonia Dana M.D.   On: 09/14/2018 15:30    Procedures Procedures (including critical care time)  Medications Ordered in ED Medications  prochlorperazine (COMPAZINE) injection 10 mg (10 mg Intravenous Given 09/14/18 1517)  diphenhydrAMINE (BENADRYL) injection 12.5 mg (12.5 mg Intravenous Given 09/14/18 1518)  cloNIDine (CATAPRES) tablet 0.2 mg (0.2 mg Oral Given 09/14/18 1609)     Initial Impression / Assessment and Plan / ED Course  I have reviewed the  triage vital signs and the nursing notes.  Pertinent labs & imaging results that were available during my care of the patient were reviewed by me and considered in my medical decision making (see chart for details).   Final Clinical Impressions(s) / ED Diagnoses   Final diagnoses:  SOB (shortness of breath)   58 year old female with history of ESRD on dialysis presenting for evaluation of shortness of breath started yesterday.  Missed dialysis yesterday and only had partial treatment today.  Also complaining of headache on the right side of the head associated with photosensitivity.  Started 5 hours prior to arrival.  On exam patient does appear to be fluid overloaded has crackles in the bilateral lower lobes.  She does have minimal wheezing in the bilateral upper lobes however less likely COPD exacerbation, favor fluid overload.  She will likely need dialysis.  She was given for nightly dose of clonidine to help lower her blood pressures.  She was placed on 3L O2 and sats improved and she remains comfortable.  Additionally, with regard to headache, Her neuro exam is nonfocal.  Her headache seems migrainous in nature.  CT scan negative for intracranial bleeding or obvious CVA.  She also notes that she recently had bilateral neck pain with bilateral trapezius pain and has some  tenderness on exam.  She had x-ray completed at urgent care.  Obtained CT imaging of the cervical spine to rule out cervical pathology as cause of headache.  This did showed some degenerative changes with some disc bulging potentially going to the spinal cord.  She does not have any numbness/weakness or findings on exam to suggest cord involvement.  Radiology is recommending MRI for further evaluation.  If she continues to have no neurologic symptoms she may be appropriate for outpatient MRI.  I did discuss this with her prior to her transfer.  She was given headache cocktail and headache completely improved.  4:08 PM CONSULT  with Dr. Moshe Cipro with nephrology who states that patient can be transferred to come for dialysis however she would likely not be dialyzed until tomorrow.  Recommends hospitalist admission.  4:44 PM CONSULT with Dr. Evangeline Gula with hospitalist service at [facility] who accepts pt for admission.   Patient transferred to [facility] in stable condition.   ED Discharge Orders    None       Bishop Dublin 09/14/18 2058    Quintella Reichert, MD 09/16/18 [phone]

## 2018-09-14 NOTE — ED Notes (Signed)
Patient transported to CT 

## 2018-09-14 NOTE — Progress Notes (Signed)
58 year old female with end-stage renal disease dialyzes Monday Wednesday and Friday at [facility] who skipped her dialysis yesterday.  She presents emergency department short of breath and hypoxic complaints of headaches.  She was at dialysis today but had only a partial treatment because of headache and cramping.  She requested that they stop dialysis due to that.  In the emergency department here she is found to be volume overloaded and short of breath but requiring 2 L nasal cannula.  Hest x-ray is consistent with pulmonary edema.  Complains of persistent headache and a CT scan of the head and neck were obtained which found a C3-C4 disc protrusion.  He is followed by [facility] in [facility]. Case was discussed with Dr. Moshe Cipro who feels patient could dialyze in a.m.  She is going to be admitted overnight and dialyzed in the morning.  Guards to her disc protrusion recommend close follow-up at [facility].  Patient may require MRI for further evaluation.  Physical examination does not reveal any neurological deficits.

## 2018-09-14 NOTE — H&P (Signed)
History and Physical    Jodi Bell ZDG:[id number] DOB: Oct 30, 1960 DOA: 09/14/2018  PCP: [facility] [place]  Patient coming from: Home  I have personally briefly reviewed patient's old medical records in [place]  Chief Complaint: SOB  HPI: Jodi Bell is a 58 y.o. female with medical history significant of ESRD on dialysis MWF, DM2, HTN, COPD.  Patient presents to the ED at [facility] with c/o headache, SOB, and hypertension.  Headache onset at 10am today, woke her from sleep, pain is right frontal.  Associated nausea and sensitivity to light.  Constant since onset.  Started having SOB last night, inhalers not helping.  No CP, no cough, no fevers.  Symptoms similar to CHF in past.  Wt up 11 lbs from last dialysis on Friday.  Skipped dialysis yesterday, went today but only for 3 hours before dialysis terminated prematurely due to cramping.   ED Course: BP 194/91, now 155/81 after 0.2mg  clonidine.  CXR shows pulmonary edema.  COVID negative.  CT neck shows central disk protrusion at C3-C4 but patient having no neurologic symptoms.   Review of Systems: As per HPI, otherwise all review of systems negative.  Past Medical History:  Diagnosis Date   Anemia    Arthritis    oa and psoriatic ra   Asthma    Back pain, chronic    lower back   Candida infection finished tx 2 days ago   Chronic insomnia    Chronic kidney disease    stage 3 ckd last ov nephrology 04-29-17 epic   Chronic kidney disease (CKD) stage G4/A1, severely decreased glomerular filtration rate (GFR) between 15-29 mL/min/1.73 square meter and albuminuria creatinine ratio less than 30 mg/g (HCC)    COPD (chronic obstructive pulmonary disease) ([place])    Diabetes mellitus    type 2   Eczema    Essential hypertension    GERD (gastroesophageal reflux disease)    History of hiatal hernia    Obesity    Pneumonia few yrs ago x 2    Past Surgical History:  Procedure Laterality Date   BACK  SURGERY  2016   lower back fusion with cage   BIOPSY  09/23/2017   Procedure: BIOPSY;  Surgeon: Wonda Horner, MD;  Location: [facility];  Service: Endoscopy;;   CESAREAN SECTION  1990   x 1    COLONOSCOPY WITH PROPOFOL N/A 09/23/2017   Procedure: COLONOSCOPY WITH PROPOFOL Hemostatic clips placed;  Surgeon: Wonda Horner, MD;  Location: [facility];  Service: Endoscopy;  Laterality: N/A;   DILATION AND CURETTAGE OF UTERUS  1988   ESOPHAGOGASTRODUODENOSCOPY (EGD) WITH PROPOFOL N/A 09/23/2017   Procedure: ESOPHAGOGASTRODUODENOSCOPY (EGD) WITH PROPOFOL;  Surgeon: Wonda Horner, MD;  Location: [facility];  Service: Endoscopy;  Laterality: N/A;   PARTIAL KNEE ARTHROPLASTY Left 11/12/2017   Procedure: left unicompartmental arthroplasty-medial;  Surgeon: Paralee Cancel, MD;  Location: [facility];  Service: Orthopedics;  Laterality: Left;  67min   SUBMUCOSAL INJECTION  09/23/2017   Procedure: SUBMUCOSAL INJECTION;  Surgeon: Wonda Horner, MD;  Location: [facility];  Service: Endoscopy;;  in colon   [place]     reports that she quit smoking about 4 months ago. Her smoking use included cigarettes. She has a 20.00 pack-year smoking history. She has never used smokeless tobacco. She reports current alcohol use. She reports that she does not use drugs.  Allergies  Allergen Reactions   Adalimumab Other (See Comments)    Blisters  on abdomen =humira   Allyl Isothiocyanate Swelling    SPICEY MUSTARD SWELLING OF TONGUE AND MOUTH   Banana Swelling    TONGUE AND MOUTH SWELLING   Ezetimibe Other (See Comments)   Ibuprofen Other (See Comments)    Renal Problems   Leflunomide And Related Other (See Comments)    Severe Headache   Lexapro [Escitalopram Oxalate] Other (See Comments)    Hand problems - Serotonin Syndrome    Lisinopril Swelling    Angioedema    Other Other (See Comments)    Cosentyx= angioedema    Pistachio Nut (Diagnostic) Itching    Mouth itches and  tongue swelling Hazel nuts and pecans also   Statins Other (See Comments)    Leg pains and weakness   Trazodone And Nefazodone Other (See Comments)    Serotonin Syndrome     Family History  Problem Relation Age of Onset   Lung cancer Mother    Diabetes Sister    Heart disease Sister    Thyroid cancer Other        1/2 sister   Heart disease Other        1/2 sister     Prior to Admission medications   Medication Sig Start Date End Date Taking? Authorizing Provider  allopurinol (ZYLOPRIM) 300 MG tablet Take 150 mg by mouth daily.     [provider]  amLODipine (NORVASC) 10 MG tablet Take 10 mg by mouth daily.    [provider]  bisoprolol (ZEBETA) 10 MG tablet Take 2 tablets (20 mg total) by mouth daily. 12/14/17   Bonnell Public, MD  budesonide-formoterol (SYMBICORT) 160-4.5 MCG/ACT inhaler Inhale 2 puffs into the lungs 2 (two) times daily.    [provider]  cetirizine (ZYRTEC) 10 MG tablet Take 20 mg by mouth 2 (two) times daily.  06/13/17   Shelda Pal, DO  cloNIDine (CATAPRES) 0.2 MG tablet Take 0.2 mg by mouth 2 (two) times daily.  07/15/15   [provider]  EPINEPHrine (EPIPEN) 0.3 mg/0.3 mL SOAJ injection Inject 0.3 mLs (0.3 mg total) into the muscle once. Patient taking differently: Inject 0.3 mg into the muscle as needed (for allergic reaction).  09/30/12   Le, Thao P, DO  esomeprazole (NEXIUM) 20 MG capsule Take 20 mg by mouth every evening.     [provider]  ezetimibe (ZETIA) 10 MG tablet TAKE 1 TABLET BY MOUTH EVERY DAY 03/22/18   Adrian Prows, MD  ferrous sulfate 325 (65 FE) MG tablet Take 325 mg by mouth daily.     [provider]  fluticasone (FLONASE) 50 MCG/ACT nasal spray Place 1 spray into both nostrils daily as needed for allergies or rhinitis.     [provider]  folic acid (FOLVITE) 381 MCG tablet Take 400 mcg by mouth daily.     [provider]  furosemide (LASIX) 40  MG tablet Take 40 mg by mouth 2 (two) times daily.     [provider]  gabapentin (NEURONTIN) 100 MG capsule Take 1 capsule (100 mg total) by mouth at bedtime for 30 days. 05/20/18 06/19/18  Aline August, MD  gabapentin (NEURONTIN) 300 MG capsule Take 1 capsule (300 mg total) by mouth at bedtime as needed (Neuropathic pain). 12/14/17 12/14/18  Dana Allan I, MD  hydrALAZINE (APRESOLINE) 50 MG tablet Take 1 tablet (50 mg total) by mouth every 8 (eight) hours. 12/14/17   Dana Allan I, MD  isosorbide mononitrate (IMDUR) 60 MG 24 hr  tablet Take 1 tablet (60 mg total) by mouth daily. 03/02/18   Thurnell Lose, MD  JANUVIA 100 MG tablet Take 100 mg by mouth daily.  08/09/15   [provider]  montelukast (SINGULAIR) 10 MG tablet Take 10 mg by mouth at bedtime.    [provider]  Multiple Vitamin (MULTIVITAMIN) tablet Take 1 tablet by mouth daily.    [provider]  ondansetron (ZOFRAN) 4 MG tablet Take 1 tablet (4 mg total) by mouth every 8 (eight) hours as needed for nausea or vomiting. 05/20/18 05/20/19  Aline August, MD  oxyCODONE 10 MG TABS Take 1-2 tablets (10-20 mg total) by mouth every 6 (six) hours as needed for severe pain (pain score 7-10). Patient taking differently: Take 10 mg by mouth every 6 (six) hours as needed for severe pain (pain score 7-10).  11/13/17   Constable, Amber, PA-C  predniSONE (DELTASONE) 2.5 MG tablet Take 2.5 mg by mouth daily with breakfast.    [provider]  VENTOLIN HFA 108 (90 Base) MCG/ACT inhaler Inhale 1-2 puffs into the lungs every 6 (six) hours as needed for shortness of breath. wheezing 06/13/15   [provider]  Vitamin D, Ergocalciferol, (DRISDOL) 1.25 MG (50000 UT) CAPS capsule Take 50,000 Units by mouth every 7 (seven) days.    [provider]  Vitamin D, Ergocalciferol, (DRISDOL) 50000 units CAPS capsule Take 50,000 Units by mouth every Tuesday.  09/15/17   [provider]    zolpidem (AMBIEN) 10 MG tablet Take 10 mg by mouth at bedtime.     [provider]    Physical Exam: Vitals:   09/14/18 1900 09/14/18 1930 09/14/18 2000 09/14/18 2058  BP: (!) 146/72 (!) 146/79 (!) 150/104 (!) 155/81  Pulse: 65 70  70  Resp: 20 (!) 25 17 18   Temp:    98.3 F (36.8 C)  TempSrc:    Oral  SpO2: 99% 96%  99%  Weight:    72 kg    Constitutional: NAD, calm, comfortable Eyes: PERRL, lids and conjunctivae normal ENMT: Mucous membranes are moist. Posterior pharynx clear of any exudate or lesions.Normal dentition.  Neck: normal, supple, no masses, no thyromegaly Respiratory: Diffuse rales and decreased breath sounds Cardiovascular: Regular rate and rhythm, no murmurs / rubs / gallops. No extremity edema. 2+ pedal pulses. No carotid bruits.  Abdomen: no tenderness, no masses palpated. No hepatosplenomegaly. Bowel sounds positive.  Musculoskeletal: no clubbing / cyanosis. No joint deformity upper and lower extremities. Good ROM, no contractures. Normal muscle tone.  Skin: no rashes, lesions, ulcers. No induration Neurologic: CN 2-12 grossly intact. Sensation intact, DTR normal. Strength 5/5 in all 4.  Psychiatric: Normal judgment and insight. Alert and oriented x 3. Normal mood.    Labs on Admission: I have personally reviewed following labs and imaging studies  CBC: Recent Labs  Lab 09/14/18 1447  WBC 7.2  NEUTROABS 5.9  HGB 10.7*  HCT 35.1*  MCV 89.8  PLT 417   Basic Metabolic Panel: Recent Labs  Lab 09/14/18 1447  NA 136  K 5.0  CL 98  CO2 27  GLUCOSE 149*  BUN 24*  CREATININE 5.23*  CALCIUM 8.6*   GFR: Estimated Creatinine Clearance: 10.9 mL/min (A) (by C-G formula based on SCr of 5.23 mg/dL (H)). Liver Function Tests: Recent Labs  Lab 09/14/18 1447  AST 21  ALT 18  ALKPHOS 123  BILITOT 0.7  PROT 6.8  ALBUMIN 3.5   No results for input(s): LIPASE,  AMYLASE in the last 168 hours. No results for input(s): AMMONIA in the last 168  hours. Coagulation Profile: No results for input(s): INR, PROTIME in the last 168 hours. Cardiac Enzymes: No results for input(s): CKTOTAL, CKMB, CKMBINDEX, TROPONINI in the last 168 hours. BNP (last 3 results) No results for input(s): PROBNP in the last 8760 hours. HbA1C: No results for input(s): HGBA1C in the last 72 hours. CBG: Recent Labs  Lab 09/14/18 2057  GLUCAP 115*   Lipid Profile: No results for input(s): CHOL, HDL, LDLCALC, TRIG, CHOLHDL, LDLDIRECT in the last 72 hours. Thyroid Function Tests: No results for input(s): TSH, T4TOTAL, FREET4, T3FREE, THYROIDAB in the last 72 hours. Anemia Panel: No results for input(s): VITAMINB12, FOLATE, FERRITIN, TIBC, IRON, RETICCTPCT in the last 72 hours. Urine analysis:    Component Value Date/Time   COLORURINE YELLOW 08/17/2015 1621   APPEARANCEUR CLEAR 08/17/2015 1621   LABSPEC 1.015 08/17/2015 1621   PHURINE 5.5 08/17/2015 1621   GLUCOSEU NEGATIVE 08/17/2015 1621   HGBUR NEGATIVE 08/17/2015 1621   BILIRUBINUR NEGATIVE 08/17/2015 1621   KETONESUR NEGATIVE 08/17/2015 1621   PROTEINUR 100 (A) 08/17/2015 1621   UROBILINOGEN 0.2 09/05/2014 0013   NITRITE NEGATIVE 08/17/2015 1621   LEUKOCYTESUR NEGATIVE 08/17/2015 1621    Radiological Exams on Admission: Dg Chest 1 View  Result Date: 09/14/2018 CLINICAL DATA:  Elevated blood pressure and shortness of breath. EXAM: CHEST  1 VIEW COMPARISON:  April 21, 2018 FINDINGS: Enlarged cardiac silhouette. Calcific atherosclerotic disease of the aorta. Mixed pattern pulmonary edema. No pleural effusions seen. Osseous structures are without acute abnormality. Soft tissues are grossly normal. IMPRESSION: Enlarged cardiac silhouette with mixed pattern pulmonary edema. Electronically Signed   By: Fidela Salisbury M.D.   On: 09/14/2018 15:25   Ct Head Wo Contrast  Result Date: 09/14/2018 CLINICAL DATA:  Headache, stiff neck, worst headache of life, elevated blood pressure for 1-2 weeks,  shortness of breath since last night, dialysis this morning EXAM: CT HEAD WITHOUT CONTRAST CT CERVICAL SPINE WITHOUT CONTRAST TECHNIQUE: Multidetector CT imaging of the head and cervical spine was performed following the standard protocol without intravenous contrast. Multiplanar CT image reconstructions of the cervical spine were also generated. COMPARISON:  CT head 08/17/2015, cervical spine radiographs of 09/13/2018 FINDINGS: CT HEAD FINDINGS Brain: Normal ventricular morphology. No midline shift or mass effect. Small vessel chronic ischemic changes of deep cerebral white matter. No intracranial hemorrhage, mass lesion, or evidence of acute infarction. No extra-axial fluid collections. Few scattered dural calcifications at falx Vascular: Atherosclerotic calcifications of internal carotid arteries bilaterally at skull base. No hyperdense vessels. Skull: Intact Sinuses/Orbits: Clear Other: N/A CT CERVICAL SPINE FINDINGS Alignment: Normal Skull base and vertebrae: Low normal osseous mineralization. Disc space narrowing and endplate spur formation at C4-C5 and C5-C6. Vertebral body heights maintained without fracture or bone, subluxation or bone destruction. Soft tissues and spinal canal: Prevertebral soft tissues normal thickness. Remaining cervical soft tissues grossly unremarkable. Atherosclerotic calcifications of the carotid bifurcations noted. Disc levels: Bulging disc at C4-C5. Probable central disc protrusion at C3-C4, potentially exerting mass effect upon spinal cord. Upper chest: Tip of LEFT lung apex clear. Other: N/A IMPRESSION: Small vessel chronic ischemic changes of deep cerebral white matter. No acute intracranial abnormalities. Degenerative disc disease changes of the cervical spine primarily at C4-C5 and C5-C6. Probable central disc protrusion at C3-C4, potentially exerting mass effect upon the spinal cord; this could be better evaluated by MR imaging of the cervical spine without contrast.  Electronically Signed  By: Lavonia Dana M.D.   On: 09/14/2018 15:30   Ct Cervical Spine Wo Contrast  Result Date: 09/14/2018 CLINICAL DATA:  Headache, stiff neck, worst headache of life, elevated blood pressure for 1-2 weeks, shortness of breath since last night, dialysis this morning EXAM: CT HEAD WITHOUT CONTRAST CT CERVICAL SPINE WITHOUT CONTRAST TECHNIQUE: Multidetector CT imaging of the head and cervical spine was performed following the standard protocol without intravenous contrast. Multiplanar CT image reconstructions of the cervical spine were also generated. COMPARISON:  CT head 08/17/2015, cervical spine radiographs of 09/13/2018 FINDINGS: CT HEAD FINDINGS Brain: Normal ventricular morphology. No midline shift or mass effect. Small vessel chronic ischemic changes of deep cerebral white matter. No intracranial hemorrhage, mass lesion, or evidence of acute infarction. No extra-axial fluid collections. Few scattered dural calcifications at falx Vascular: Atherosclerotic calcifications of internal carotid arteries bilaterally at skull base. No hyperdense vessels. Skull: Intact Sinuses/Orbits: Clear Other: N/A CT CERVICAL SPINE FINDINGS Alignment: Normal Skull base and vertebrae: Low normal osseous mineralization. Disc space narrowing and endplate spur formation at C4-C5 and C5-C6. Vertebral body heights maintained without fracture or bone, subluxation or bone destruction. Soft tissues and spinal canal: Prevertebral soft tissues normal thickness. Remaining cervical soft tissues grossly unremarkable. Atherosclerotic calcifications of the carotid bifurcations noted. Disc levels: Bulging disc at C4-C5. Probable central disc protrusion at C3-C4, potentially exerting mass effect upon spinal cord. Upper chest: Tip of LEFT lung apex clear. Other: N/A IMPRESSION: Small vessel chronic ischemic changes of deep cerebral white matter. No acute intracranial abnormalities. Degenerative disc disease changes of the  cervical spine primarily at C4-C5 and C5-C6. Probable central disc protrusion at C3-C4, potentially exerting mass effect upon the spinal cord; this could be better evaluated by MR imaging of the cervical spine without contrast. Electronically Signed   By: Lavonia Dana M.D.   On: 09/14/2018 15:30    EKG: Independently reviewed.  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 Volume overload in setting of ESRD, missed dialysis and only partial session today - 1. EDP spoke with Dr. Moshe Cipro: plan for dialysis in AM 2. O2 via [employer] 3. Tele monitor 2. C3-C4 disk protrusion - 1. Can follow up outpt for this 3. DM2 - 1. Continue Januvia 4. HTN - 1. Continue home BP meds 5. OSA - 1. Continue CPAP 6. COPD - 1. Continue home neb treatments 2. Continue chronic prednisone  DVT prophylaxis: Heparin [place] Code Status: Full Family Communication: No family in room Disposition Plan: Home after admit Consults called: Nephrology Admission status: Place in obs - convert to IP if not discharged tomorrow     Alcario Drought, [facility]  How to contact the [facility] Attending or Consulting provider [place] or covering provider during after hours [place], for this patient?  1. Check the care team in [facility] and look for a) attending/consulting [facility] provider listed and b) the [facility] team listed 2. Log into [email or web address]  Amion Physician Scheduling and messaging for groups and whole hospitals  On call and physician scheduling software for group practices, residents, hospitalists and other medical providers for call, clinic, rotation  and shift schedules.  OnCall [employer] is a hospital-wide system for scheduling doctors and paging doctors on call. EasyPlot is for scientific plotting and data analysis.  [email or web address]  and use [place]'s universal password to access. If you do not have the password, please contact the hospital operator.  3. Locate the [facility] provider you are looking for under [facility] and page to a number that you can be directly reached. 4. If you still have difficulty reaching the provider, please page the [facility] (Director on Call) for the Hospitalists listed on amion for assistance.  09/14/2018, 10:16 PM

## 2018-09-14 NOTE — Progress Notes (Signed)
Just arrived from [facility] via [place]. Tele applied and called. Oxygen @ 2L/[place] applied. [facility] paged to notify of arrival.

## 2018-09-14 NOTE — ED Notes (Signed)
[facility] arrived to transport [facility] to [facility] 

## 2018-09-15 ENCOUNTER — Encounter (HOSPITAL_COMMUNITY): Payer: Self-pay | Admitting: *Deleted

## 2018-09-15 ENCOUNTER — Other Ambulatory Visit: Payer: Self-pay

## 2018-09-15 DIAGNOSIS — N186 End stage renal disease: Secondary | ICD-10-CM | POA: Diagnosis not present

## 2018-09-15 DIAGNOSIS — J9601 Acute respiratory failure with hypoxia: Secondary | ICD-10-CM

## 2018-09-15 DIAGNOSIS — J42 Unspecified chronic bronchitis: Secondary | ICD-10-CM | POA: Diagnosis not present

## 2018-09-15 DIAGNOSIS — I10 Essential (primary) hypertension: Secondary | ICD-10-CM | POA: Diagnosis not present

## 2018-09-15 LAB — GLUCOSE, CAPILLARY
Glucose-Capillary: 102 mg/dL — ABNORMAL HIGH (ref 70–99)
Glucose-Capillary: 118 mg/dL — ABNORMAL HIGH (ref 70–99)

## 2018-09-15 LAB — HEPATITIS B SURFACE ANTIGEN: Hepatitis B Surface Ag: NEGATIVE

## 2018-09-15 MED ORDER — TRAMADOL HCL 50 MG PO TABS
50.0000 mg | ORAL_TABLET | Freq: Once | ORAL | Status: AC
  Administered 2018-09-15: 50 mg via ORAL
  Filled 2018-09-15: qty 1

## 2018-09-15 MED ORDER — ACETAMINOPHEN 325 MG PO TABS
ORAL_TABLET | ORAL | Status: AC
  Administered 2018-09-15: 650 mg via ORAL
  Filled 2018-09-15: qty 2

## 2018-09-15 MED ORDER — OXYCODONE HCL 10 MG PO TABS: 10.0000 mg | ORAL_TABLET | Freq: Four times a day (QID) | ORAL | Status: DC | PRN

## 2018-09-15 NOTE — Discharge Summary (Signed)
Physician Discharge Summary  Jodi Bell NOI:[id number] DOB: [birth year] DOA: 09/14/2018  PCP: [facility]  Admit date: 09/14/2018 Discharge date: 09/15/2018  Time spent: 45 minutes  Recommendations for Outpatient Follow-up:  1. Continue dialysis per normal schedule. 2. Follow up with PCP for evaluation of BP control 3. 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 female with medical history significant of ESRD on dialysis MWF, DM2, HTN, COPD presented 8/4 to the ED at [facility] with c/o headache, SOB, and hypertension. Headache onset at 10am  woke her from sleep, pain is right frontal.  Associated nausea and sensitivity to light.  Constant since onset. Started having SOB later and reported home inhalers not helping.  No CP, no cough, no fevers.  Symptoms similar to CHF in past.  Wt up 11 lbs from last dialysis on Friday 4 days prior.  Skipped dialysis , went today but only for 3 hours before dialysis terminated prematurely due to cramping  Hospital Course:  1.  acute respiratory failure with hypoxia secondary to volume overload in setting of ESRD, missed dialysis and only partial session at make up in setting of uncontrolled BP.  Initially oxygen saturation level 85% on room air. Chest xray with enlarged cardiac silhouette with mixed pattern pulmonary edema.   Provided with oxygen supplementation and full Dialysis session on  8/5. At discharge oxygen saturation level 95% on room air with ambulation 2. C3-C4 disk protrusion - 1. Can follow up outpt for this 3. DM2 - 1. Continue Januvia 4. HTN - 1. Continue home BP meds 5. OSA - 1. Continue CPAP 6. COPD - 1. Continue home neb treatments 2. Continue chronic prednisone   Procedures:  Dialysis 09/15/18  Consultations:  Dr Moshe Cipro nephrology  Discharge Exam: Vitals:   09/15/18 1130 09/15/18 1140  BP: (!) 184/86 (!) 184/84  Pulse: 75 75  Resp:  18  Temp:  98.5 F (36.9 C)  SpO2:  98%    General: awake alert no acute distress Cardiovascular: rrr no mgr no LE edema Respiratory: normal effort BS clear bilaterally no wheeze or crackles  Discharge Instructions    Allergies as of 09/15/2018      Reactions   Adalimumab Other (See Comments)   Blisters on abdomen =humira   Allyl Isothiocyanate Swelling   SPICEY MUSTARD SWELLING OF TONGUE AND MOUTH   Banana Swelling   TONGUE AND MOUTH SWELLING   Ezetimibe Other (See Comments)   Ibuprofen Other (See Comments)   Renal Problems   Leflunomide And Related Other (See Comments)   Severe Headache   Lexapro [escitalopram Oxalate] Other (See Comments)   Hand problems - Serotonin Syndrome    Lisinopril Swelling   Angioedema    Other Other (See Comments)   Cosentyx= angioedema   Pistachio Nut (diagnostic) Itching   Mouth itches and tongue swelling  Hazel nuts and pecans also   Statins Other (See Comments)   Leg pains and weakness   Trazodone And Nefazodone Other (See Comments)   Serotonin Syndrome       Medication List    TAKE these medications   allopurinol 300 MG tablet Commonly known as: ZYLOPRIM Take 150 mg by mouth daily.   amLODipine 10 MG tablet Commonly known as: NORVASC Take 10 mg by mouth daily.   bisoprolol 10 MG tablet Commonly known as: ZEBETA Take 2 tablets (20 mg total) by mouth daily.    budesonide-formoterol 160-4.5 MCG/ACT inhaler Commonly known as: SYMBICORT Inhale 2 puffs into the lungs 2 (two) times daily.   cetirizine 10 MG tablet Commonly known as: ZYRTEC Take 20 mg by mouth 2 (two) times daily.   cloNIDine 0.2 MG tablet Commonly known as: CATAPRES Take 0.2 mg by mouth 2 (two) times daily.   EPINEPHrine 0.3 mg/0.3 mL Soaj injection Commonly known as: EpiPen Inject 0.3 mLs (0.3 mg total) into the muscle once. What changed:   when to take this  reasons to take this   esomeprazole 20 MG capsule Commonly known as: NEXIUM Take 20 mg by mouth every evening.   ezetimibe 10 MG tablet Commonly known as: ZETIA TAKE 1 TABLET BY MOUTH EVERY DAY   ferrous sulfate 325 (65 FE) MG tablet Take 325 mg by mouth daily.   fluticasone 50 MCG/ACT nasal spray Commonly known as: FLONASE Place 1 spray into both nostrils daily as needed for allergies or rhinitis.   folic acid 892 MCG tablet Commonly known as: FOLVITE Take 400 mcg by mouth daily.   furosemide 40 MG tablet Commonly known as: LASIX Take 40 mg by mouth 2 (two) times daily.   gabapentin 300 MG capsule Commonly known as: Neurontin Take 1 capsule (300 mg total) by mouth at bedtime as needed (Neuropathic pain). What changed: Another medication with the same name was removed. Continue taking this medication, and follow the directions you see here.   hydrALAZINE 50 MG tablet Commonly known as: APRESOLINE Take 1 tablet (50 mg total) by mouth every 8 (eight) hours.   isosorbide mononitrate 60 MG 24 hr tablet Commonly known as: IMDUR Take 1 tablet (60 mg total) by mouth daily.   Januvia 100 MG tablet Generic drug: sitaGLIPtin Take 100 mg by mouth daily.   montelukast 10 MG tablet Commonly known as: SINGULAIR Take 10 mg by mouth at bedtime.   multivitamin tablet Take 1 tablet by mouth daily.   ondansetron 4 MG tablet Commonly known as: Zofran Take 1 tablet (4 mg total) by mouth every 8 (eight)  hours as needed for nausea or vomiting.   Oxycodone HCl 10 MG Tabs Take 1 tablet (10 mg total) by mouth every 6 (six) hours as needed (pain score 7-10). What changed:   how much to take  reasons to take this   predniSONE 2.5 MG tablet Commonly known as: DELTASONE Take 2.5 mg by mouth daily with breakfast.   Ventolin HFA 108 (90 Base) MCG/ACT inhaler Generic drug: albuterol Inhale 1-2 puffs into the lungs every 6 (six) hours as needed for shortness of breath. wheezing   Vitamin D (Ergocalciferol) 1.25 MG (50000 UT) Caps capsule Commonly known as: DRISDOL Take 50,000 Units by mouth every 7 (seven) days.   Vitamin D (Ergocalciferol) 1.25 MG (50000 UT) Caps capsule Commonly known as: DRISDOL Take 50,000 Units by mouth every Tuesday.   zolpidem 10 MG tablet Commonly known as: AMBIEN Take 10 mg  by mouth at bedtime.      Allergies  Allergen Reactions  . Adalimumab Other (See Comments)    Blisters on abdomen =humira  . Allyl Isothiocyanate Swelling    SPICEY MUSTARD SWELLING OF TONGUE AND MOUTH  . Banana Swelling    TONGUE AND MOUTH SWELLING  . Ezetimibe Other (See Comments)  . Ibuprofen Other (See Comments)    Renal Problems  . Leflunomide And Related Other (See Comments)    Severe Headache  . Lexapro [Escitalopram Oxalate] Other (See Comments)    Hand problems - Serotonin Syndrome   . Lisinopril Swelling    Angioedema   . Other Other (See Comments)    Cosentyx= angioedema   . Pistachio Nut (Diagnostic) Itching    Mouth itches and tongue swelling Hazel nuts and pecans also  . Statins Other (See Comments)    Leg pains and weakness  . Trazodone And Nefazodone Other (See Comments)    Serotonin Syndrome       The results of significant diagnostics from this hospitalization (including imaging, microbiology, ancillary and laboratory) are listed below for reference.    Significant Diagnostic Studies: Dg Chest 1 View  Result Date: 09/14/2018 CLINICAL DATA:   Elevated blood pressure and shortness of breath. EXAM: CHEST  1 VIEW COMPARISON:  April 21, 2018 FINDINGS: Enlarged cardiac silhouette. Calcific atherosclerotic disease of the aorta. Mixed pattern pulmonary edema. No pleural effusions seen. Osseous structures are without acute abnormality. Soft tissues are grossly normal. IMPRESSION: Enlarged cardiac silhouette with mixed pattern pulmonary edema. Electronically Signed   By: Fidela Salisbury M.D.   On: 09/14/2018 15:25   Ct Head Wo Contrast  Result Date: 09/14/2018 CLINICAL DATA:  Headache, stiff neck, worst headache of life, elevated blood pressure for 1-2 weeks, shortness of breath since last night, dialysis this morning EXAM: CT HEAD WITHOUT CONTRAST CT CERVICAL SPINE WITHOUT CONTRAST TECHNIQUE: Multidetector CT imaging of the head and cervical spine was performed following the standard protocol without intravenous contrast. Multiplanar CT image reconstructions of the cervical spine were also generated. COMPARISON:  CT head 08/17/2015, cervical spine radiographs of 09/13/2018 FINDINGS: CT HEAD FINDINGS Brain: Normal ventricular morphology. No midline shift or mass effect. Small vessel chronic ischemic changes of deep cerebral white matter. No intracranial hemorrhage, mass lesion, or evidence of acute infarction. No extra-axial fluid collections. Few scattered dural calcifications at falx Vascular: Atherosclerotic calcifications of internal carotid arteries bilaterally at skull base. No hyperdense vessels. Skull: Intact Sinuses/Orbits: Clear Other: N/A CT CERVICAL SPINE FINDINGS Alignment: Normal Skull base and vertebrae: Low normal osseous mineralization. Disc space narrowing and endplate spur formation at C4-C5 and C5-C6. Vertebral body heights maintained without fracture or bone, subluxation or bone destruction. Soft tissues and spinal canal: Prevertebral soft tissues normal thickness. Remaining cervical soft tissues grossly unremarkable. Atherosclerotic  calcifications of the carotid bifurcations noted. Disc levels: Bulging disc at C4-C5. Probable central disc protrusion at C3-C4, potentially exerting mass effect upon spinal cord. Upper chest: Tip of LEFT lung apex clear. Other: N/A IMPRESSION: Small vessel chronic ischemic changes of deep cerebral white matter. No acute intracranial abnormalities. Degenerative disc disease changes of the cervical spine primarily at C4-C5 and C5-C6. Probable central disc protrusion at C3-C4, potentially exerting mass effect upon the spinal cord; this could be better evaluated by MR imaging of the cervical spine without contrast. Electronically Signed   By: Lavonia Dana M.D.   On: 09/14/2018 15:30   Ct Cervical Spine Wo Contrast  Result Date: 09/14/2018 CLINICAL DATA:  Headache, stiff neck, worst headache of life, elevated blood pressure for 1-2 weeks, shortness of breath since last night, dialysis this morning EXAM: CT HEAD WITHOUT CONTRAST CT CERVICAL SPINE WITHOUT CONTRAST TECHNIQUE: Multidetector CT imaging of the head and cervical spine was performed following the standard protocol without intravenous contrast. Multiplanar CT image reconstructions of the cervical spine were also generated. COMPARISON:  CT head 08/17/2015, cervical spine radiographs of 09/13/2018 FINDINGS: CT HEAD FINDINGS Brain: Normal ventricular morphology. No midline shift or mass effect. Small vessel chronic ischemic changes of deep cerebral white matter. No intracranial hemorrhage, mass lesion, or evidence of acute infarction. No extra-axial fluid collections. Few scattered dural calcifications at falx Vascular: Atherosclerotic calcifications of internal carotid arteries bilaterally at skull base. No hyperdense vessels. Skull: Intact Sinuses/Orbits: Clear Other: N/A CT CERVICAL SPINE FINDINGS Alignment: Normal Skull base and vertebrae: Low normal osseous mineralization. Disc space narrowing and endplate spur formation at C4-C5 and C5-C6. Vertebral body  heights maintained without fracture or bone, subluxation or bone destruction. Soft tissues and spinal canal: Prevertebral soft tissues normal thickness. Remaining cervical soft tissues grossly unremarkable. Atherosclerotic calcifications of the carotid bifurcations noted. Disc levels: Bulging disc at C4-C5. Probable central disc protrusion at C3-C4, potentially exerting mass effect upon spinal cord. Upper chest: Tip of LEFT lung apex clear. Other: N/A IMPRESSION: Small vessel chronic ischemic changes of deep cerebral white matter. No acute intracranial abnormalities. Degenerative disc disease changes of the cervical spine primarily at C4-C5 and C5-C6. Probable central disc protrusion at C3-C4, potentially exerting mass effect upon the spinal cord; this could be better evaluated by MR imaging of the cervical spine without contrast. Electronically Signed   By: Lavonia Dana M.D.   On: 09/14/2018 15:30    Microbiology: Recent Results (from the past 240 hour(s))  SARS Coronavirus 2 [facility] order, Performed in [facility] hospital lab) Nasopharyngeal Nasopharyngeal Swab     Status: None   Collection Time: 09/14/18  3:15 PM   Specimen: Nasopharyngeal Swab  Result Value Ref Range Status   SARS Coronavirus 2 NEGATIVE NEGATIVE Final    Comment: (NOTE) If result is NEGATIVE SARS-CoV-2 target nucleic acids are NOT DETECTED. The SARS-CoV-2 RNA is generally detectable in upper and lower  respiratory specimens during the acute phase of infection. The lowest  concentration of SARS-CoV-2 viral copies this assay can detect is 250  copies / mL. A negative result does not preclude SARS-CoV-2 infection  and should not be used as the sole basis for treatment or other  patient management decisions.  A negative result may occur with  improper specimen collection / handling, submission of specimen other  than nasopharyngeal swab, presence of viral mutation(s) within the  areas targeted by this assay, and inadequate  number of viral copies  (<250 copies / mL). A negative result must be combined with clinical  observations, patient history, and epidemiological information. If result is POSITIVE SARS-CoV-2 target nucleic acids are DETECTED. The SARS-CoV-2 RNA is generally detectable in upper and lower  respiratory specimens dur ing the acute phase of infection.  Positive  results are indicative of active infection with SARS-CoV-2.  Clinical  correlation with patient history and other diagnostic information is  necessary to determine patient infection status.  Positive results do  not rule out bacterial infection or co-infection with other viruses. If result is PRESUMPTIVE POSTIVE SARS-CoV-2 nucleic acids MAY BE PRESENT.   A presumptive positive result was obtained on the submitted specimen  and confirmed on repeat testing.  While  2019 novel coronavirus  (SARS-CoV-2) nucleic acids may be present in the submitted sample  additional confirmatory testing may be necessary for epidemiological  and / or clinical management purposes  to differentiate between  SARS-CoV-2 and other Sarbecovirus currently known to infect humans.  If clinically indicated additional testing with an alternate test  methodology [phone]) is advised. The SARS-CoV-2 RNA is generally  detectable in upper and lower respiratory sp ecimens during the acute  phase of infection. The expected result is Negative. Fact Sheet for Patients:  StrictlyIdeas.no Fact Sheet for Healthcare Providers: BankingDealers.co.za This test is not yet approved or cleared by the [place] FDA and has been authorized for detection and/or diagnosis of SARS-CoV-2 by FDA under an Emergency Use Authorization (EUA).  This EUA will remain in effect (meaning this test can be used) for the duration of the COVID-19 declaration under Section 564(b)(1) of the Act, 21 U.S.C. section 360bbb-3(b)(1), unless the  authorization is terminated or revoked sooner. Performed at [facility], [address] [place], [place] [address]      Labs: Basic Metabolic Panel: Recent Labs  Lab 09/14/18 1447  NA 136  K 5.0  CL 98  CO2 27  GLUCOSE 149*  BUN 24*  CREATININE 5.23*  CALCIUM 8.6*   Liver Function Tests: Recent Labs  Lab 09/14/18 1447  AST 21  ALT 18  ALKPHOS 123  BILITOT 0.7  PROT 6.8  ALBUMIN 3.5   No results for input(s): LIPASE, AMYLASE in the last 168 hours. No results for input(s): AMMONIA in the last 168 hours. CBC: Recent Labs  Lab 09/14/18 1447  WBC 7.2  NEUTROABS 5.9  HGB 10.7*  HCT 35.1*  MCV 89.8  PLT 211   Cardiac Enzymes: No results for input(s): CKTOTAL, CKMB, CKMBINDEX, TROPONINI in the last 168 hours. BNP: BNP (last 3 results) Recent Labs    02/27/18 1419 05/16/18 0400  BNP 889.6* >4,500.0*    ProBNP (last 3 results) No results for input(s): PROBNP in the last 8760 hours.  CBG: Recent Labs  Lab 09/14/18 2057 09/15/18 0655 09/15/18 1234  GLUCAP 115* 102* 118*       Signed:  Radene Gunning NP [facility] 09/15/2018, 2:04 PM

## 2018-09-15 NOTE — Procedures (Signed)
Patient was seen on dialysis and the procedure was supervised.  BFR 400  Via AVF BP is  188/96.   Patient appears to be tolerating treatment well- c/o neck pain and does not look visibly SOB-  BP still very high- put in for a high goal hoping that she will no longer need O2 once HD is done and BP will be down.  If is the case is fine for d/c after HD   Louis Meckel 09/15/2018

## 2018-09-15 NOTE — Progress Notes (Signed)
DISCHARGE NOTE HOME Jodi Bell to be discharged Home per MD order. Discussed prescriptions and follow up appointments with the patient. Prescriptions given to patient; medication list explained in detail. Patient verbalized understanding.  Skin clean, dry and intact without evidence of skin break down, no evidence of skin tears noted. IV catheter discontinued intact. Site without signs and symptoms of complications. Dressing and pressure applied. Pt denies pain at the site currently. No complaints noted.  Patient free of lines, drains, and wounds.   An After Visit Summary (AVS) was printed and given to the patient. Patient escorted via wheelchair, and discharged home via private auto.  Paulla Fore, RN

## 2018-09-15 NOTE — Progress Notes (Addendum)
Pt on RA, O2 saturation 92% Pt walked into the hall on RA, O2 saturation  At 95%.  Paulla Fore, RN.

## 2018-09-24 ENCOUNTER — Emergency Department (HOSPITAL_BASED_OUTPATIENT_CLINIC_OR_DEPARTMENT_OTHER)
Admission: EM | Admit: 2018-09-24 | Discharge: 2018-09-24 | Disposition: A | Discharge status: Home / Self Care | Payer: Medicare PPO | Attending: Emergency Medicine | Admitting: Emergency Medicine

## 2018-09-24 ENCOUNTER — Encounter (HOSPITAL_BASED_OUTPATIENT_CLINIC_OR_DEPARTMENT_OTHER): Payer: Self-pay | Admitting: *Deleted

## 2018-09-24 ENCOUNTER — Other Ambulatory Visit: Payer: Self-pay

## 2018-09-24 DIAGNOSIS — R51 Headache: Secondary | ICD-10-CM | POA: Diagnosis present

## 2018-09-24 DIAGNOSIS — I120 Hypertensive chronic kidney disease with stage 5 chronic kidney disease or end stage renal disease: Secondary | ICD-10-CM | POA: Insufficient documentation

## 2018-09-24 DIAGNOSIS — Z79899 Other long term (current) drug therapy: Secondary | ICD-10-CM | POA: Diagnosis not present

## 2018-09-24 DIAGNOSIS — N186 End stage renal disease: Secondary | ICD-10-CM | POA: Insufficient documentation

## 2018-09-24 DIAGNOSIS — J449 Chronic obstructive pulmonary disease, unspecified: Secondary | ICD-10-CM | POA: Insufficient documentation

## 2018-09-24 DIAGNOSIS — Z87891 Personal history of nicotine dependence: Secondary | ICD-10-CM | POA: Insufficient documentation

## 2018-09-24 DIAGNOSIS — Z992 Dependence on renal dialysis: Secondary | ICD-10-CM | POA: Diagnosis not present

## 2018-09-24 DIAGNOSIS — Z96652 Presence of left artificial knee joint: Secondary | ICD-10-CM | POA: Insufficient documentation

## 2018-09-24 DIAGNOSIS — I10 Essential (primary) hypertension: Secondary | ICD-10-CM

## 2018-09-24 LAB — CBC WITH DIFFERENTIAL/PLATELET
Abs Immature Granulocytes: 0.02 10*3/uL (ref 0.00–0.07)
Basophils Absolute: 0 10*3/uL (ref 0.0–0.1)
Basophils Relative: 1 %
Eosinophils Absolute: 0.2 10*3/uL (ref 0.0–0.5)
Eosinophils Relative: 4 %
HCT: 36.7 % (ref 36.0–46.0)
Hemoglobin: 11.4 g/dL — ABNORMAL LOW (ref 12.0–15.0)
Immature Granulocytes: 0 %
Lymphocytes Relative: 25 %
Lymphs Abs: 1.3 10*3/uL (ref 0.7–4.0)
MCH: 27.1 pg (ref 26.0–34.0)
MCHC: 31.1 g/dL (ref 30.0–36.0)
MCV: 87.4 fL (ref 80.0–100.0)
Monocytes Absolute: 0.4 10*3/uL (ref 0.1–1.0)
Monocytes Relative: 8 %
Neutro Abs: 3.1 10*3/uL (ref 1.7–7.7)
Neutrophils Relative %: 62 %
Platelets: 241 10*3/uL (ref 150–400)
RBC: 4.2 MIL/uL (ref 3.87–5.11)
RDW: 17.6 % — ABNORMAL HIGH (ref 11.5–15.5)
WBC: 5.1 10*3/uL (ref 4.0–10.5)
nRBC: 0 % (ref 0.0–0.2)

## 2018-09-24 LAB — BASIC METABOLIC PANEL
Anion gap: 12 (ref 5–15)
BUN: 23 mg/dL — ABNORMAL HIGH (ref 6–20)
CO2: 27 mmol/L (ref 22–32)
Calcium: 8.7 mg/dL — ABNORMAL LOW (ref 8.9–10.3)
Chloride: 97 mmol/L — ABNORMAL LOW (ref 98–111)
Creatinine, Ser: 4.3 mg/dL — ABNORMAL HIGH (ref 0.44–1.00)
GFR calc Af Amer: 12 mL/min — ABNORMAL LOW (ref >60)
GFR calc non Af Amer: 11 mL/min — ABNORMAL LOW (ref >60)
Glucose, Bld: 115 mg/dL — ABNORMAL HIGH (ref 70–99)
Potassium: 4.7 mmol/L (ref 3.5–5.1)
Sodium: 136 mmol/L (ref 135–145)

## 2018-09-24 MED ORDER — HYDRALAZINE HCL 20 MG/ML IJ SOLN
10.0000 mg | Freq: Once | INTRAMUSCULAR | Status: AC
  Administered 2018-09-24: 10 mg via INTRAVENOUS
  Filled 2018-09-24: qty 1

## 2018-09-24 MED ORDER — DIPHENHYDRAMINE HCL 50 MG/ML IJ SOLN
25.0000 mg | Freq: Once | INTRAMUSCULAR | Status: AC
  Administered 2018-09-24: 25 mg via INTRAVENOUS
  Filled 2018-09-24: qty 1

## 2018-09-24 MED ORDER — CLONIDINE HCL 0.1 MG PO TABS
0.2000 mg | ORAL_TABLET | Freq: Once | ORAL | Status: AC
  Administered 2018-09-24: 0.2 mg via ORAL
  Filled 2018-09-24: qty 2

## 2018-09-24 MED ORDER — METOCLOPRAMIDE HCL 5 MG/ML IJ SOLN
10.0000 mg | Freq: Once | INTRAMUSCULAR | Status: AC
  Administered 2018-09-24: 10 mg via INTRAVENOUS
  Filled 2018-09-24: qty 2

## 2018-09-24 MED ORDER — HYDRALAZINE HCL 25 MG PO TABS: 50.0000 mg | ORAL_TABLET | Freq: Once | ORAL | Status: DC

## 2018-09-24 NOTE — ED Notes (Signed)
Pt. Reports headache all week with elevated B/P.  Pt. Reports she was last tested for covid a week ago here at [facility]

## 2018-09-24 NOTE — ED Notes (Signed)
Pt. Reports she is here for elevated blood pressure.  Last dialysis was this morning her B/P there was high.

## 2018-09-24 NOTE — ED Provider Notes (Signed)
[facility] EMERGENCY DEPARTMENT Provider Note   CSN: [id number] Arrival date & time: 09/24/18  1743     History   Chief Complaint Chief Complaint  Patient presents with  . Hypertension    HPI Jodi Bell is a 58 y.o. female history of CKD on dialysis, uncontrolled hypertension here presenting with headaches, hypertension .  Patient was recently admitted for shortness of breath, hypertension.  She also saw urgent care earlier this week and her losartan was changed to olmesartan.  She also has been on the clonidine as well as hydralazine and Norvasc.  Patient states that she has some headaches but denies any chest pain or shortness of breath .  She got dialyzed today but blood pressure has been persistently in the low 200s so she wants to come here for evaluation.  She states that she is compliant with all her BP meds.    The history is provided by the patient.    Past Medical History:  Diagnosis Date  . Anemia   . Arthritis    oa and psoriatic ra  . Asthma   . Back pain, chronic    lower back  . Candida infection finished tx 2 days ago  . Chronic insomnia   . Chronic kidney disease    stage 3 ckd last ov nephrology 04-29-17 epic  . Chronic kidney disease (CKD) stage G4/A1, severely decreased glomerular filtration rate (GFR) between 15-29 mL/min/1.73 square meter and albuminuria creatinine ratio less than 30 mg/g (HCC)   . COPD (chronic obstructive pulmonary disease) ([place])   . Diabetes mellitus    type 2  . Eczema   . Essential hypertension   . GERD (gastroesophageal reflux disease)   . History of hiatal hernia   . Obesity   . Pneumonia few yrs ago x 2    Patient Active Problem List   Diagnosis Date Noted  . Volume overload state of heart 09/14/2018  . Intervertebral cervical disc disorder with myelopathy, cervical region 09/14/2018  . OSA (obstructive sleep apnea) 09/14/2018  . Asthma 05/16/2018  . Suspected Covid-19 Virus Infection 05/16/2018  .  ESRD (end stage renal disease) ([place]) 05/16/2018  . Fluid overload 02/27/2018  . Chest pain, rule out acute myocardial infarction 12/12/2017  . Status post left partial knee replacement 11/12/2017  . Hematochezia 09/20/2017  . GERD (gastroesophageal reflux disease) 09/20/2017  . 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 2 diabetes mellitus ([place]) 09/20/2017  . COPD (chronic obstructive pulmonary disease) ([place]) 09/20/2017  . Tobacco use 09/20/2017  . Symptomatic anemia 09/20/2017  . Headache 09/20/2017  . Serotonin syndrome 09/28/2013  . Acute respiratory failure with hypoxia ([place]) 09/28/2013  . Encephalopathy acute 09/28/2013  . Hypotension, unspecified 09/28/2013  . 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 Type 2 diabetes mellitus ([place]) 03/06/2008  . INSOMNIA, CHRONIC 03/06/2008  . Essential hypertension 03/06/2008  . ASTHMA 03/06/2008  . ECZEMA 03/06/2008  . BACK PAIN, LUMBAR, CHRONIC 03/06/2008    Past Surgical History:  Procedure Laterality Date  . BACK SURGERY  2016   lower back fusion with cage  . BIOPSY  09/23/2017   Procedure: BIOPSY;  Surgeon: Wonda Horner, MD;  Location: [facility];  Service: Endoscopy;;  . CESAREAN SECTION  1990   x 1   . COLONOSCOPY WITH PROPOFOL N/A 09/23/2017   Procedure: COLONOSCOPY WITH PROPOFOL Hemostatic clips placed;  Surgeon: Wonda Horner, MD;  Location: [facility];  Service: Endoscopy;  Laterality: N/A;  . DILATION AND CURETTAGE OF  UTERUS  1988  . ESOPHAGOGASTRODUODENOSCOPY (EGD) WITH PROPOFOL N/A 09/23/2017   Procedure: ESOPHAGOGASTRODUODENOSCOPY (EGD) WITH PROPOFOL;  Surgeon: Wonda Horner, MD;  Location: [facility];  Service: Endoscopy;  Laterality: N/A;  . PARTIAL KNEE ARTHROPLASTY Left 11/12/2017   Procedure: left unicompartmental arthroplasty-medial;  Surgeon: Paralee Cancel, MD;  Location: [facility];  Service: Orthopedics;  Laterality: Left;  83min  . SUBMUCOSAL INJECTION  09/23/2017   Procedure: SUBMUCOSAL INJECTION;  Surgeon: Wonda Horner, MD;   Location: [facility];  Service: Endoscopy;;  in colon  . TUBAL LIGATION  1990     OB History   No obstetric history on file.      Home Medications    Prior to Admission medications   Medication Sig Start Date End Date Taking? Authorizing Provider  allopurinol (ZYLOPRIM) 300 MG tablet Take 150 mg by mouth daily.     [provider]  amLODipine (NORVASC) 10 MG tablet Take 10 mg by mouth daily.    [provider]  bisoprolol (ZEBETA) 10 MG tablet Take 2 tablets (20 mg total) by mouth daily. 12/14/17   Bonnell Public, MD  budesonide-formoterol (SYMBICORT) 160-4.5 MCG/ACT inhaler Inhale 2 puffs into the lungs 2 (two) times daily.    [provider]  cetirizine (ZYRTEC) 10 MG tablet Take 20 mg by mouth 2 (two) times daily.  06/13/17   Shelda Pal, DO  cloNIDine (CATAPRES) 0.2 MG tablet Take 0.2 mg by mouth 2 (two) times daily.  07/15/15   [provider]  EPINEPHrine (EPIPEN) 0.3 mg/0.3 mL SOAJ injection Inject 0.3 mLs (0.3 mg total) into the muscle once. Patient taking differently: Inject 0.3 mg into the muscle as needed (for allergic reaction).  09/30/12   Le, Thao P, DO  esomeprazole (NEXIUM) 20 MG capsule Take 20 mg by mouth every evening.     [provider]  ezetimibe (ZETIA) 10 MG tablet TAKE 1 TABLET BY MOUTH EVERY DAY 03/22/18   Adrian Prows, MD  ferrous sulfate 325 (65 FE) MG tablet Take 325 mg by mouth daily.     [provider]  fluticasone (FLONASE) 50 MCG/ACT nasal spray Place 1 spray into both nostrils daily as needed for allergies or rhinitis.     [provider]  folic acid (FOLVITE) [id number] MCG tablet Take 400 mcg by mouth daily.     [provider]  furosemide (LASIX) 40 MG tablet Take 40 mg by mouth 2 (two) times daily.     [provider]  gabapentin (NEURONTIN) 300 MG capsule Take 1 capsule (300 mg total) by mouth at bedtime as needed (Neuropathic pain). 12/14/17 12/14/18  Dana Allan I, MD  hydrALAZINE (APRESOLINE) 50 MG tablet Take 1 tablet (50 mg total) by mouth every 8 (eight) hours. 12/14/17   Bonnell Public, MD  isosorbide mononitrate (IMDUR) 60 MG 24 hr tablet Take 1 tablet (60 mg total) by mouth daily. 03/02/18   Thurnell Lose, MD  JANUVIA 100 MG tablet Take 100 mg by mouth daily.  08/09/15   [provider]  montelukast (SINGULAIR) 10 MG tablet Take 10 mg by mouth at bedtime.    [provider]  Multiple Vitamin (MULTIVITAMIN) tablet Take 1 tablet by mouth daily.    [provider]  ondansetron (ZOFRAN) 4 MG tablet Take 1 tablet (4 mg total) by mouth every 8 (eight) hours as needed for nausea or vomiting. 05/20/18 05/20/19  Aline August, MD  Oxycodone HCl 10 MG TABS Take  1 tablet (10 mg total) by mouth every 6 (six) hours as needed (pain score 7-10). 09/15/18   Black, Lezlie Octave, NP  predniSONE (DELTASONE) 2.5 MG tablet Take 2.5 mg by mouth daily with breakfast.    [provider]  VENTOLIN HFA 108 (90 Base) MCG/ACT inhaler Inhale 1-2 puffs into the lungs every 6 (six) hours as needed for shortness of breath. wheezing 06/13/15   [provider]  Vitamin D, Ergocalciferol, (DRISDOL) 1.25 MG (50000 UT) CAPS capsule Take 50,000 Units by mouth every 7 (seven) days.    [provider]  Vitamin D, Ergocalciferol, (DRISDOL) 50000 units CAPS capsule Take 50,000 Units by mouth every Tuesday.  09/15/17   [provider]  zolpidem (AMBIEN) 10 MG tablet Take 10 mg by mouth at bedtime.     [provider]    Family History Family History  Problem Relation Age of Onset  . Lung cancer Mother   . Diabetes Sister   . Heart disease Sister   . Asthma Daughter   . Arthritis Daughter   . Arthritis Daughter   . Thyroid cancer Other        1/2 sister  . Heart disease Other        1/2 sister    Social History Social History   Tobacco Use  . Smoking status: Former Smoker    Packs/day: 0.50    Years:  40.00    Pack years: 20.00    Types: Cigarettes    Quit date: 05/15/2018    Years since quitting: 0.3  . Smokeless tobacco: Never Used  Substance Use Topics  . Alcohol use: Yes    Comment: occ  . Drug use: No     Allergies   Adalimumab, Allyl isothiocyanate, Banana, Ezetimibe, Ibuprofen, Leflunomide and related, Lexapro [escitalopram oxalate], Lisinopril, Other, Pistachio nut (diagnostic), Statins, and Trazodone and nefazodone   Review of Systems Review of Systems  Neurological: Positive for headaches.  All other systems reviewed and are negative.    Physical Exam Updated Vital Signs BP (!) 174/86   Pulse 75   Temp 98.3 F (36.8 C) (Oral)   Resp (!) 21   Ht 5\' 2"  (1.575 m)   Wt 67.8 kg   SpO2 97%   BMI 27.34 kg/m   Physical Exam Vitals signs reviewed.  HENT:     Head: Normocephalic.     Mouth/Throat:     Mouth: Mucous membranes are moist.  Eyes:     Extraocular Movements: Extraocular movements intact.     Pupils: Pupils are equal, round, and reactive to light.  Neck:     Musculoskeletal: Normal range of motion.  Cardiovascular:     Rate and Rhythm: Normal rate and regular rhythm.     Pulses: Normal pulses.     Heart sounds: Normal heart sounds.  Pulmonary:     Effort: Pulmonary effort is normal.     Breath sounds: Normal breath sounds.  Abdominal:     General: Abdomen is flat.     Palpations: Abdomen is soft.  Musculoskeletal: Normal range of motion.  Skin:    General: Skin is warm.     Capillary Refill: Capillary refill takes less than 2 seconds.  Neurological:     General: No focal deficit present.     Mental Status: She is alert and oriented to person, place, and time.     Cranial Nerves: No cranial nerve deficit.     Sensory: No sensory deficit.  Comments: CN 2- 12 intact, nl strength and sensation throughout, nl finger to nose bilaterally, nl gait   Psychiatric:        Mood and Affect: Mood normal.      ED Treatments / Results  Labs  (all labs ordered are listed, but only abnormal results are displayed) Labs Reviewed  CBC WITH DIFFERENTIAL/PLATELET - Abnormal; Notable for the following components:      Result Value   Hemoglobin 11.4 (*)    RDW 17.6 (*)    All other components within normal limits  BASIC METABOLIC PANEL - Abnormal; Notable for the following components:   Chloride 97 (*)    Glucose, Bld 115 (*)    BUN 23 (*)    Creatinine, Ser 4.30 (*)    Calcium 8.7 (*)    GFR calc non Af Amer 11 (*)    GFR calc Af Amer 12 (*)    All other components within normal limits    EKG EKG Interpretation  Date/Time:  Friday September 24 2018 18:40:45 EDT Ventricular Rate:  74 PR Interval:    QRS Duration: 87 QT Interval:  409 QTC Calculation: 454 R Axis:   -23 Text Interpretation:  Sinus rhythm Probable left atrial enlargement Borderline left axis deviation Baseline wander in lead(s) V3 No significant change since last tracing Confirmed by Wandra Arthurs [phone]) on 09/24/2018 6:50:55 PM   Radiology No results found.  Procedures Procedures (including critical care time)  Medications Ordered in ED Medications  cloNIDine (CATAPRES) tablet 0.2 mg (0.2 mg Oral Given 09/24/18 1933)  hydrALAZINE (APRESOLINE) injection 10 mg (10 mg Intravenous Given 09/24/18 1929)  metoCLOPramide (REGLAN) injection 10 mg (10 mg Intravenous Given 09/24/18 1936)  diphenhydrAMINE (BENADRYL) injection 25 mg (25 mg Intravenous Given 09/24/18 1934)     Initial Impression / Assessment and Plan / ED Course  I have reviewed the triage vital signs and the nursing notes.  Pertinent labs & imaging results that were available during my care of the patient were reviewed by me and considered in my medical decision making (see chart for details).       TEASHA KUBACKI is a 58 y.o. female here with headaches. Likely somatic hypertension.  Patient is on multiple BP meds and has normal neuro exam currently .  Patient also was recently admitted and had  extensive work-up.  She is compliant with her BP meds .  We will give her extra dose of her clonidine and hydralazine.  Also given some migraine cocktail  8:41 PM Headache improved. BP down to 170s from 200s. Told her to take her BP meds as prescribed. She just had a medication change and likely needs more time to be effective. She will continue to take her BP at home and follow up with PCP in a week    Final Clinical Impressions(s) / ED Diagnoses   Final diagnoses:  None    ED Discharge Orders    None       Drenda Freeze, MD 09/24/18 2042

## 2018-09-24 NOTE — Discharge Instructions (Signed)
Continue your blood pressure medicines as prescribed.   Continue to take your blood pressure at home.  Please mention to your dialysis doctor about your blood pressure.  Return to ER if you have worse chest pain, shortness of breath, headaches, vomiting.

## 2018-09-24 NOTE — ED Triage Notes (Signed)
Pt c/o increased BP

## 2018-09-26 ENCOUNTER — Emergency Department (HOSPITAL_BASED_OUTPATIENT_CLINIC_OR_DEPARTMENT_OTHER)
Admission: EM | Admit: 2018-09-26 | Discharge: 2018-09-27 | Disposition: A | Discharge status: Home / Self Care | Payer: Medicare PPO | Attending: Emergency Medicine | Admitting: Emergency Medicine

## 2018-09-26 ENCOUNTER — Other Ambulatory Visit: Payer: Self-pay

## 2018-09-26 ENCOUNTER — Encounter (HOSPITAL_BASED_OUTPATIENT_CLINIC_OR_DEPARTMENT_OTHER): Payer: Self-pay | Admitting: Emergency Medicine

## 2018-09-26 ENCOUNTER — Emergency Department (HOSPITAL_BASED_OUTPATIENT_CLINIC_OR_DEPARTMENT_OTHER): Payer: Medicare PPO

## 2018-09-26 DIAGNOSIS — I129 Hypertensive chronic kidney disease with stage 1 through stage 4 chronic kidney disease, or unspecified chronic kidney disease: Secondary | ICD-10-CM | POA: Insufficient documentation

## 2018-09-26 DIAGNOSIS — Z87891 Personal history of nicotine dependence: Secondary | ICD-10-CM | POA: Diagnosis not present

## 2018-09-26 DIAGNOSIS — R51 Headache: Secondary | ICD-10-CM | POA: Diagnosis present

## 2018-09-26 DIAGNOSIS — Z79899 Other long term (current) drug therapy: Secondary | ICD-10-CM | POA: Diagnosis not present

## 2018-09-26 DIAGNOSIS — J441 Chronic obstructive pulmonary disease with (acute) exacerbation: Secondary | ICD-10-CM | POA: Insufficient documentation

## 2018-09-26 DIAGNOSIS — R519 Headache, unspecified: Secondary | ICD-10-CM

## 2018-09-26 DIAGNOSIS — N183 Chronic kidney disease, stage 3 (moderate): Secondary | ICD-10-CM | POA: Insufficient documentation

## 2018-09-26 DIAGNOSIS — E119 Type 2 diabetes mellitus without complications: Secondary | ICD-10-CM | POA: Diagnosis not present

## 2018-09-26 DIAGNOSIS — R030 Elevated blood-pressure reading, without diagnosis of hypertension: Secondary | ICD-10-CM

## 2018-09-26 DIAGNOSIS — R0602 Shortness of breath: Secondary | ICD-10-CM | POA: Insufficient documentation

## 2018-09-26 LAB — COMPREHENSIVE METABOLIC PANEL
ALT: 25 U/L (ref 0–44)
AST: 34 U/L (ref 15–41)
Albumin: 3.7 g/dL (ref 3.5–5.0)
Alkaline Phosphatase: 128 U/L — ABNORMAL HIGH (ref 38–126)
Anion gap: 15 (ref 5–15)
BUN: 52 mg/dL — ABNORMAL HIGH (ref 6–20)
CO2: 22 mmol/L (ref 22–32)
Calcium: 8.6 mg/dL — ABNORMAL LOW (ref 8.9–10.3)
Chloride: 97 mmol/L — ABNORMAL LOW (ref 98–111)
Creatinine, Ser: 7.92 mg/dL — ABNORMAL HIGH (ref 0.44–1.00)
GFR calc Af Amer: 6 mL/min — ABNORMAL LOW (ref >60)
GFR calc non Af Amer: 5 mL/min — ABNORMAL LOW (ref >60)
Glucose, Bld: 117 mg/dL — ABNORMAL HIGH (ref 70–99)
Potassium: 5.2 mmol/L — ABNORMAL HIGH (ref 3.5–5.1)
Sodium: 134 mmol/L — ABNORMAL LOW (ref 135–145)
Total Bilirubin: 0.7 mg/dL (ref 0.3–1.2)
Total Protein: 7.1 g/dL (ref 6.5–8.1)

## 2018-09-26 LAB — CBC WITH DIFFERENTIAL/PLATELET
Abs Immature Granulocytes: 0.04 10*3/uL (ref 0.00–0.07)
Basophils Absolute: 0 10*3/uL (ref 0.0–0.1)
Basophils Relative: 0 %
Eosinophils Absolute: 0.2 10*3/uL (ref 0.0–0.5)
Eosinophils Relative: 3 %
HCT: 34.6 % — ABNORMAL LOW (ref 36.0–46.0)
Hemoglobin: 10.7 g/dL — ABNORMAL LOW (ref 12.0–15.0)
Immature Granulocytes: 1 %
Lymphocytes Relative: 31 %
Lymphs Abs: 2.2 10*3/uL (ref 0.7–4.0)
MCH: 27.1 pg (ref 26.0–34.0)
MCHC: 30.9 g/dL (ref 30.0–36.0)
MCV: 87.6 fL (ref 80.0–100.0)
Monocytes Absolute: 0.5 10*3/uL (ref 0.1–1.0)
Monocytes Relative: 8 %
Neutro Abs: 3.9 10*3/uL (ref 1.7–7.7)
Neutrophils Relative %: 57 %
Platelets: 238 10*3/uL (ref 150–400)
RBC: 3.95 MIL/uL (ref 3.87–5.11)
RDW: 17.6 % — ABNORMAL HIGH (ref 11.5–15.5)
WBC: 6.9 10*3/uL (ref 4.0–10.5)
nRBC: 0 % (ref 0.0–0.2)

## 2018-09-26 LAB — TROPONIN I (HIGH SENSITIVITY)
Troponin I (High Sensitivity): 13 ng/L (ref <18)
Troponin I (High Sensitivity): 12 ng/L (ref <18)

## 2018-09-26 MED ORDER — CLONIDINE HCL 0.1 MG PO TABS
0.2000 mg | ORAL_TABLET | Freq: Once | ORAL | Status: AC
  Administered 2018-09-26: 0.2 mg via ORAL
  Filled 2018-09-26: qty 2

## 2018-09-26 MED ORDER — HYDRALAZINE HCL 20 MG/ML IJ SOLN
10.0000 mg | Freq: Once | INTRAMUSCULAR | Status: AC
  Administered 2018-09-26: 10 mg via INTRAVENOUS
  Filled 2018-09-26: qty 1

## 2018-09-26 NOTE — ED Notes (Signed)
Patient transported to X-ray 

## 2018-09-26 NOTE — ED Triage Notes (Signed)
Reports increase in blood pressure in the last couple days. Reports seen on Friday for same. States today she has a "terrible headache."

## 2018-09-26 NOTE — ED Notes (Signed)
Pt requesting something for headache. EDP made aware

## 2018-09-26 NOTE — ED Provider Notes (Addendum)
[facility] Provider Note   CSN: [id number] Arrival date & time: 09/26/18  1948     History   Chief Complaint Chief Complaint  Patient presents with  . Hypertension  . Headache    HPI [name] is a 58 y.o. female.     The history is provided by the patient and medical records. No language interpreter was used.  Headache Pain location:  Generalized Quality:  Dull Radiates to:  Does not radiate Severity currently:  8/10 Severity at highest:  8/10 Onset quality:  Gradual Duration:  1 day Timing:  Constant Progression:  Waxing and waning Similar to prior headaches: yes   Context comment:  Elevated BP Relieved by:  Nothing Worsened by:  Nothing Ineffective treatments:  None tried Associated symptoms: no abdominal pain, no back pain, no blurred vision, no congestion, no cough, no diarrhea, no dizziness, no facial pain, no fatigue, no fever, no focal weakness, no loss of balance, no nausea, no neck pain, no neck stiffness, no numbness, no paresthesias, no photophobia, no seizures, no URI, no visual change, no vomiting and no weakness     Past Medical History:  Diagnosis Date  . Anemia   . Arthritis    oa and psoriatic ra  . Asthma   . Back pain, chronic    lower back  . Candida infection finished tx 2 days ago  . Chronic insomnia   . Chronic kidney disease    stage 3 ckd last ov nephrology 04-29-17 epic  . Chronic kidney disease (CKD) stage G4/A1, severely decreased glomerular filtration rate (GFR) between 15-29 mL/min/1.73 square meter and albuminuria creatinine ratio less than 30 mg/g (HCC)   . COPD (chronic obstructive pulmonary disease) ([place])   . Diabetes mellitus    type 2  . Eczema   . Essential hypertension   . GERD (gastroesophageal reflux disease)   . History of hiatal hernia   . Obesity   . Pneumonia few yrs ago x 2    Patient Active Problem List   Diagnosis Date Noted  . Volume overload state of heart  09/14/2018  . Intervertebral cervical disc disorder with myelopathy, cervical region 09/14/2018  . OSA (obstructive sleep apnea) 09/14/2018  . Asthma 05/16/2018  . Suspected Covid-19 Virus Infection 05/16/2018  . ESRD (end stage renal disease) ([place]) 05/16/2018  . Fluid overload 02/27/2018  . Chest pain, rule out acute myocardial infarction 12/12/2017  . Status post left partial knee replacement 11/12/2017  . Hematochezia 09/20/2017  . GERD (gastroesophageal reflux disease) 09/20/2017  . 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 2 diabetes mellitus ([place]) 09/20/2017  . COPD (chronic obstructive pulmonary disease) ([place]) 09/20/2017  . Tobacco use 09/20/2017  . Symptomatic anemia 09/20/2017  . Headache 09/20/2017  . Serotonin syndrome 09/28/2013  . Acute respiratory failure with hypoxia ([place]) 09/28/2013  . Encephalopathy acute 09/28/2013  . Hypotension, unspecified 09/28/2013  . 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 Type 2 diabetes mellitus ([place]) 03/06/2008  . INSOMNIA, CHRONIC 03/06/2008  . Essential hypertension 03/06/2008  . ASTHMA 03/06/2008  . ECZEMA 03/06/2008  . BACK PAIN, LUMBAR, CHRONIC 03/06/2008    Past Surgical History:  Procedure Laterality Date  . BACK SURGERY  2016   lower back fusion with cage  . BIOPSY  09/23/2017   Procedure: BIOPSY;  Surgeon: Wonda Horner, MD;  Location: [facility];  Service: Endoscopy;;  . CESAREAN SECTION  1990   x 1   . COLONOSCOPY WITH PROPOFOL N/A 09/23/2017  Procedure: COLONOSCOPY WITH PROPOFOL Hemostatic clips placed;  Surgeon: Wonda Horner, MD;  Location: [facility];  Service: Endoscopy;  Laterality: N/A;  . DILATION AND CURETTAGE OF UTERUS  1988  . ESOPHAGOGASTRODUODENOSCOPY (EGD) WITH PROPOFOL N/A 09/23/2017   Procedure: ESOPHAGOGASTRODUODENOSCOPY (EGD) WITH PROPOFOL;  Surgeon: Wonda Horner, MD;  Location: [facility];  Service: Endoscopy;  Laterality: N/A;  . PARTIAL KNEE ARTHROPLASTY Left 11/12/2017   Procedure: left unicompartmental  arthroplasty-medial;  Surgeon: Paralee Cancel, MD;  Location: [facility];  Service: Orthopedics;  Laterality: Left;  82min  . SUBMUCOSAL INJECTION  09/23/2017   Procedure: SUBMUCOSAL INJECTION;  Surgeon: Wonda Horner, MD;  Location: [facility];  Service: Endoscopy;;  in colon  . TUBAL LIGATION  1990     OB History   No obstetric history on file.      Home Medications    Prior to Admission medications   Medication Sig Start Date End Date Taking? Authorizing Provider  allopurinol (ZYLOPRIM) 300 MG tablet Take 150 mg by mouth daily.     [provider]  amLODipine (NORVASC) 10 MG tablet Take 10 mg by mouth daily.    [provider]  bisoprolol (ZEBETA) 10 MG tablet Take 2 tablets (20 mg total) by mouth daily. 12/14/17   Bonnell Public, MD  budesonide-formoterol (SYMBICORT) 160-4.5 MCG/ACT inhaler Inhale 2 puffs into the lungs 2 (two) times daily.    [provider]  cetirizine (ZYRTEC) 10 MG tablet Take 20 mg by mouth 2 (two) times daily.  06/13/17   Shelda Pal, DO  cloNIDine (CATAPRES) 0.2 MG tablet Take 0.2 mg by mouth 2 (two) times daily.  07/15/15   [provider]  EPINEPHrine (EPIPEN) 0.3 mg/0.3 mL SOAJ injection Inject 0.3 mLs (0.3 mg total) into the muscle once. Patient taking differently: Inject 0.3 mg into the muscle as needed (for allergic reaction).  09/30/12   Le, Thao P, DO  esomeprazole (NEXIUM) 20 MG capsule Take 20 mg by mouth every evening.     [provider]  ezetimibe (ZETIA) 10 MG tablet TAKE 1 TABLET BY MOUTH EVERY DAY 03/22/18   Adrian Prows, MD  ferrous sulfate 325 (65 FE) MG tablet Take 325 mg by mouth daily.     [provider]  fluticasone (FLONASE) 50 MCG/ACT nasal spray Place 1 spray into both nostrils daily as needed for allergies or rhinitis.     [provider]  folic acid (FOLVITE) [id number] MCG tablet Take 400 mcg by mouth daily.     [provider]  furosemide (LASIX) 40 MG tablet  Take 40 mg by mouth 2 (two) times daily.     [provider]  gabapentin (NEURONTIN) 300 MG capsule Take 1 capsule (300 mg total) by mouth at bedtime as needed (Neuropathic pain). 12/14/17 12/14/18  Dana Allan I, MD  hydrALAZINE (APRESOLINE) 50 MG tablet Take 1 tablet (50 mg total) by mouth every 8 (eight) hours. 12/14/17   Bonnell Public, MD  isosorbide mononitrate (IMDUR) 60 MG 24 hr tablet Take 1 tablet (60 mg total) by mouth daily. 03/02/18   Thurnell Lose, MD  JANUVIA 100 MG tablet Take 100 mg by mouth daily.  08/09/15   [provider]  montelukast (SINGULAIR) 10 MG tablet Take 10 mg by mouth at bedtime.    [provider]  Multiple Vitamin (MULTIVITAMIN) tablet Take 1 tablet by mouth daily.    [provider]  ondansetron (ZOFRAN) 4 MG tablet Take 1  tablet (4 mg total) by mouth every 8 (eight) hours as needed for nausea or vomiting. 05/20/18 05/20/19  Aline August, MD  Oxycodone HCl 10 MG TABS Take 1 tablet (10 mg total) by mouth every 6 (six) hours as needed (pain score 7-10). 09/15/18   Black, Lezlie Octave, NP  predniSONE (DELTASONE) 2.5 MG tablet Take 2.5 mg by mouth daily with breakfast.    [provider]  VENTOLIN HFA 108 (90 Base) MCG/ACT inhaler Inhale 1-2 puffs into the lungs every 6 (six) hours as needed for shortness of breath. wheezing 06/13/15   [provider]  Vitamin D, Ergocalciferol, (DRISDOL) 1.25 MG (50000 UT) CAPS capsule Take 50,000 Units by mouth every 7 (seven) days.    [provider]  Vitamin D, Ergocalciferol, (DRISDOL) 50000 units CAPS capsule Take 50,000 Units by mouth every Tuesday.  09/15/17   [provider]  zolpidem (AMBIEN) 10 MG tablet Take 10 mg by mouth at bedtime.     [provider]    Family History Family History  Problem Relation Age of Onset  . Lung cancer Mother   . Diabetes Sister   . Heart disease Sister   . Asthma Daughter   . Arthritis Daughter   . Arthritis  Daughter   . Thyroid cancer Other        1/2 sister  . Heart disease Other        1/2 sister    Social History Social History   Tobacco Use  . Smoking status: Former Smoker    Packs/day: 0.50    Years: 40.00    Pack years: 20.00    Types: Cigarettes    Quit date: 05/15/2018    Years since quitting: 0.3  . Smokeless tobacco: Never Used  Substance Use Topics  . Alcohol use: Yes    Comment: occ  . Drug use: No     Allergies   Adalimumab, Allyl isothiocyanate, Banana, Ezetimibe, Ibuprofen, Leflunomide and related, Lexapro [escitalopram oxalate], Lisinopril, Other, Pistachio nut (diagnostic), Statins, and Trazodone and nefazodone   Review of Systems Review of Systems  Constitutional: Negative for chills, diaphoresis, fatigue and fever.  HENT: Negative for congestion.   Eyes: Negative for blurred vision and photophobia.  Respiratory: Positive for shortness of breath. Negative for cough, chest tightness and wheezing.   Cardiovascular: Negative for chest pain and palpitations.  Gastrointestinal: Negative for abdominal distention, abdominal pain, blood in stool, constipation, diarrhea, nausea and vomiting.  Genitourinary: Negative for flank pain and frequency.  Musculoskeletal: Negative for back pain, neck pain and neck stiffness.  Neurological: Positive for headaches. Negative for dizziness, focal weakness, seizures, facial asymmetry, weakness, light-headedness, numbness, paresthesias and loss of balance.  Psychiatric/Behavioral: Negative for agitation.  All other systems reviewed and are negative.    Physical Exam Updated Vital Signs BP (!) 202/94 (BP Location: Right Arm)   Temp 98.6 F (37 C) (Oral)   Resp 18   SpO2 100%   Physical Exam Vitals signs and nursing note reviewed.  Constitutional:      General: She is not in acute distress.    Appearance: She is well-developed. She is not ill-appearing, toxic-appearing or diaphoretic.  HENT:     Head: Normocephalic and  atraumatic.     Right Ear: External ear normal.     Left Ear: External ear normal.     Nose: Nose normal.     Mouth/Throat:     Pharynx: No oropharyngeal exudate.  Eyes:     General: No  scleral icterus.    Extraocular Movements: Extraocular movements intact.     Right eye: Normal extraocular motion.     Left eye: Normal extraocular motion.     Conjunctiva/sclera: Conjunctivae normal.     Pupils: Pupils are equal, round, and reactive to light.  Neck:     Musculoskeletal: Normal range of motion and neck supple. No neck rigidity.  Cardiovascular:     Rate and Rhythm: Normal rate and regular rhythm.     Heart sounds: Normal heart sounds. No murmur.  Pulmonary:     Effort: Pulmonary effort is normal. No respiratory distress.     Breath sounds: No stridor. Rales present. No wheezing or rhonchi.  Chest:     Chest wall: No tenderness.  Abdominal:     General: There is no distension.     Tenderness: There is no abdominal tenderness. There is no rebound.  Musculoskeletal: Normal range of motion.        General: No swelling or tenderness.  Lymphadenopathy:     Cervical: No cervical adenopathy.  Skin:    General: Skin is warm.     Capillary Refill: Capillary refill takes less than 2 seconds.     Findings: No erythema or rash.  Neurological:     Mental Status: She is alert and oriented to person, place, and time.     Cranial Nerves: No cranial nerve deficit, dysarthria or facial asymmetry.     Sensory: No sensory deficit.     Motor: No weakness or abnormal muscle tone.     Coordination: Coordination normal.     Deep Tendon Reflexes: Reflexes are normal and symmetric.  Psychiatric:        Mood and Affect: Mood normal.      ED Treatments / Results  Labs (all labs ordered are listed, but only abnormal results are displayed) Labs Reviewed  CBC WITH DIFFERENTIAL/PLATELET - Abnormal; Notable for the following components:      Result Value   Hemoglobin 10.7 (*)    HCT 34.6 (*)     RDW 17.6 (*)    All other components within normal limits  COMPREHENSIVE METABOLIC PANEL - Abnormal; Notable for the following components:   Sodium 134 (*)    Potassium 5.2 (*)    Chloride 97 (*)    Glucose, Bld 117 (*)    BUN 52 (*)    Creatinine, Ser 7.92 (*)    Calcium 8.6 (*)    Alkaline Phosphatase 128 (*)    GFR calc non Af Amer 5 (*)    GFR calc Af Amer 6 (*)    All other components within normal limits  TROPONIN I (HIGH SENSITIVITY)  TROPONIN I (HIGH SENSITIVITY)    EKG EKG Interpretation  Date/Time:  Sunday September 26 2018 21:38:55 EDT Ventricular Rate:  72 PR Interval:    QRS Duration: 86 QT Interval:  422 QTC Calculation: 462 R Axis:   -19 Text Interpretation:  Sinus rhythm Probable left atrial enlargement Probable left ventricular hypertrophy When compared to prior, no significant changes seen.  No STEMI Confirmed by Antony Blackbird [phone]) on 09/27/2018 12:20:27 AM   Radiology Dg Chest 2 View  Result Date: 09/26/2018 CLINICAL DATA:  Bilateral lung rales. Exertional shortness of breath. Ex-smoker. EXAM: CHEST - 2 VIEW COMPARISON:  09/14/2018. FINDINGS: Stable enlarged cardiac silhouette. Increased prominence of the pulmonary vasculature with decreased prominence of the interstitial markings. No pleural fluid. Lumbar spine fixation hardware. IMPRESSION: 1. Cardiomegaly with progressive pulmonary vascular congestion. 2.  Improved pulmonary edema. Electronically Signed   By: Claudie Revering M.D.   On: 09/26/2018 21:40    Procedures Procedures (including critical care time)  CRITICAL CARE Performed by: Gwenyth Allegra Elizebeth Kluesner Total critical care time: 35 minutes Critical care time was exclusive of separately billable procedures and treating other patients. Critical care was necessary to treat or prevent imminent or life-threatening deterioration. Critical care was time spent personally by me on the following activities: development of treatment plan with patient and/or  surrogate as well as nursing, discussions with consultants, evaluation of patient's response to treatment, examination of patient, obtaining history from patient or surrogate, ordering and performing treatments and interventions, ordering and review of laboratory studies, ordering and review of radiographic studies, pulse oximetry and re-evaluation of patient's condition.   Medications Ordered in ED Medications  cloNIDine (CATAPRES) tablet 0.2 mg (0.2 mg Oral Given 09/26/18 2207)  hydrALAZINE (APRESOLINE) injection 10 mg (10 mg Intravenous Given 09/26/18 2209)     Initial Impression / Assessment and Plan / ED Course  I have reviewed the triage vital signs and the nursing notes.  Pertinent labs & imaging results that were available during my care of the patient were reviewed by me and considered in my medical decision making (see chart for details).        ARRIELLE DOFFLEMYER is a 58 y.o. female with a past medical history sniffing for hypertension, diabetes, ESRD on dialysis MWF, COPD, asthma, obesity who presents with elevated blood pressure, headache, and exertional shortness of breath.  Patient reports that she recently had some blood pressure medications changed and has been having elevated blood pressures causing headaches.  She reports that she was seen several days ago with similar headache and came in for blood pressure management.  She says that it happened again and she wanted to be evaluated.  She reports he is also had some exertional shortness of breath which is worse than her baseline.  She denies any new extremity edema, fevers, chills, congestion, or cough.  She denies any chest pain.  She denies nausea, vomiting, and does not have any urinary symptoms for the small amount of urine she makes.  On exam, patient has no focal neurologic deficits.  Neck is nontender and not rigid.  Normal sensation and strength in all extremities.  Normal gait.  Lungs had some faint crackles in the bases  but were otherwise clear.  No significant wheezing on my initial exam.  Chest nontender.  No murmur.  Patient maintaining oxygen saturations on room air.  Patient's blood pressure was over 200 on arrival similar to previous visit.  Patient was given clonidine and hydralazine similar to prior and blood pressure improved into the 170s.  Patient's headache improved when blood pressure improved.  Had a shared decision making conversation with patient about CT head imaging.  Patient recently had a normal head CT and she would rather not get a repeat head CT today given the improving headache.  This was felt to be reasonable decision given her lack of other neurologic deficits.  Patient was ambulated with pulse oximetry with no hypoxia.  Chest x-ray was performed showing improved pulmonary edema.  Troponin negative x2.  Other labs consistent with a ESRD patient needing dialysis tomorrow.  No evidence that she needs emergent dialysis tonight.  As headache is improved and patient is feeling better, she was felt stable for discharge home for dialysis tomorrow.  She will continue to take her blood pressure medication at home and  will call her doctor tomorrow to discuss further blood pressure management titration.  She understands extremely strict return precautions and follow-up instructions.  She had no other questions or concerns and was discharged in good condition with improved symptoms.    Final Clinical Impressions(s) / ED Diagnoses   Final diagnoses:  Bad headache  Elevated blood pressure reading  Exertional shortness of breath    ED Discharge Orders    None      Clinical Impression: 1. Bad headache   2. Elevated blood pressure reading   3. Exertional shortness of breath     Disposition: Discharge  Condition: Good  I have discussed the results, Dx and Tx plan with the pt(& family if present). He/she/they expressed understanding and agree(s) with the plan. Discharge instructions discussed  at great length. Strict return precautions discussed and pt &/or family have verbalized understanding of the instructions. No further questions at time of discharge.    Discharge Medication List as of 09/27/2018 12:47 AM      Follow Up: your PCP and nephrology team.        Raenette Sakata, Gwenyth Allegra, MD 09/27/18 0129    Edward Guthmiller, Gwenyth Allegra, MD 10/04/18 1031

## 2018-09-27 NOTE — Discharge Instructions (Signed)
Your work-up today was consistent with headaches related to your elevated blood pressure.  As your blood pressure improved, her headache improved.  We worked you up to look for too much fluid in your lungs and your x-ray showed improved fluid in your lungs compared to prior.  You were able to ambulate around the emergency department without hypoxia, low oxygen.  Your other labs were consistent with your renal disease and you are scheduled for dialysis tomorrow.  Please go to your dialysis tomorrow and call your PCP and nephrologist for further management of your elevated blood pressure as this has been the second visit recently for headaches related to the blood pressure.  We discussed together holding on CT imaging given the improving headache and your recent head imaging that was otherwise reassuring.  If you develop any new or worsened symptoms, please return to the nearest emergency department.

## 2018-09-27 NOTE — ED Notes (Signed)
Pt understood dc material. NAD noted. All questions answered to satisfaction. Pt escorted to check out counter. 

## 2018-10-07 ENCOUNTER — Other Ambulatory Visit: Payer: Self-pay | Admitting: Internal Medicine

## 2018-10-07 DIAGNOSIS — Z1231 Encounter for screening mammogram for malignant neoplasm of breast: Secondary | ICD-10-CM

## 2018-11-19 ENCOUNTER — Ambulatory Visit: Payer: Medicare PPO

## 2019-01-04 ENCOUNTER — Ambulatory Visit: Payer: Medicare PPO

## 2019-04-30 ENCOUNTER — Ambulatory Visit: Payer: Medicare PPO | Attending: Internal Medicine

## 2019-04-30 DIAGNOSIS — Z23 Encounter for immunization: Secondary | ICD-10-CM

## 2019-04-30 NOTE — Progress Notes (Signed)
   Covid-19 Vaccination Clinic  Name:  Jodi Bell    MRN: [id number] DOB: [birth year]  04/30/2019  [name] was observed post Covid-19 immunization for 15 minutes without incident. She was provided with Vaccine Information Sheet and instruction to access the V-Safe system.   [name] was instructed to call 911 with any severe reactions post vaccine: Marland Kitchen Difficulty breathing  . Swelling of face and throat  . A fast heartbeat  . A bad rash all over body  . Dizziness and weakness   Immunizations Administered    Name Date Dose VIS Date Route   Moderna COVID-19 Vaccine 04/30/2019  9:05 AM 0.5 mL 01/11/2019 Intramuscular   Manufacturer: Moderna   Lot: [id number]   [employer] [place]: [id number]

## 2019-05-12 DIAGNOSIS — K922 Gastrointestinal hemorrhage, unspecified: Secondary | ICD-10-CM

## 2019-05-12 HISTORY — DX: Gastrointestinal hemorrhage, unspecified: K92.2

## 2019-05-17 ENCOUNTER — Other Ambulatory Visit: Payer: Self-pay

## 2019-05-17 ENCOUNTER — Encounter (HOSPITAL_BASED_OUTPATIENT_CLINIC_OR_DEPARTMENT_OTHER): Payer: Self-pay | Admitting: *Deleted

## 2019-05-17 ENCOUNTER — Inpatient Hospital Stay (HOSPITAL_BASED_OUTPATIENT_CLINIC_OR_DEPARTMENT_OTHER)
Admission: EM | Admit: 2019-05-17 | Discharge: 2019-05-21 | DRG: 380 | Disposition: A | Discharge status: Home Health | Payer: Medicare PPO | Attending: Internal Medicine | Admitting: Internal Medicine

## 2019-05-17 DIAGNOSIS — D631 Anemia in chronic kidney disease: Secondary | ICD-10-CM | POA: Diagnosis present

## 2019-05-17 DIAGNOSIS — E8889 Other specified metabolic disorders: Secondary | ICD-10-CM | POA: Diagnosis present

## 2019-05-17 DIAGNOSIS — Z20822 Contact with and (suspected) exposure to covid-19: Secondary | ICD-10-CM | POA: Diagnosis present

## 2019-05-17 DIAGNOSIS — J45909 Unspecified asthma, uncomplicated: Secondary | ICD-10-CM | POA: Diagnosis present

## 2019-05-17 DIAGNOSIS — K2211 Ulcer of esophagus with bleeding: Secondary | ICD-10-CM | POA: Diagnosis present

## 2019-05-17 DIAGNOSIS — R195 Other fecal abnormalities: Secondary | ICD-10-CM

## 2019-05-17 DIAGNOSIS — Z992 Dependence on renal dialysis: Secondary | ICD-10-CM | POA: Diagnosis not present

## 2019-05-17 DIAGNOSIS — Z87891 Personal history of nicotine dependence: Secondary | ICD-10-CM

## 2019-05-17 DIAGNOSIS — K31819 Angiodysplasia of stomach and duodenum without bleeding: Secondary | ICD-10-CM | POA: Diagnosis present

## 2019-05-17 DIAGNOSIS — Z801 Family history of malignant neoplasm of trachea, bronchus and lung: Secondary | ICD-10-CM

## 2019-05-17 DIAGNOSIS — I160 Hypertensive urgency: Secondary | ICD-10-CM | POA: Diagnosis present

## 2019-05-17 DIAGNOSIS — E1142 Type 2 diabetes mellitus with diabetic polyneuropathy: Secondary | ICD-10-CM | POA: Diagnosis present

## 2019-05-17 DIAGNOSIS — G8929 Other chronic pain: Secondary | ICD-10-CM | POA: Diagnosis present

## 2019-05-17 DIAGNOSIS — I10 Essential (primary) hypertension: Secondary | ICD-10-CM | POA: Diagnosis not present

## 2019-05-17 DIAGNOSIS — E875 Hyperkalemia: Secondary | ICD-10-CM | POA: Diagnosis present

## 2019-05-17 DIAGNOSIS — L4050 Arthropathic psoriasis, unspecified: Secondary | ICD-10-CM | POA: Diagnosis present

## 2019-05-17 DIAGNOSIS — I120 Hypertensive chronic kidney disease with stage 5 chronic kidney disease or end stage renal disease: Secondary | ICD-10-CM | POA: Diagnosis present

## 2019-05-17 DIAGNOSIS — N2581 Secondary hyperparathyroidism of renal origin: Secondary | ICD-10-CM | POA: Diagnosis present

## 2019-05-17 DIAGNOSIS — Z808 Family history of malignant neoplasm of other organs or systems: Secondary | ICD-10-CM

## 2019-05-17 DIAGNOSIS — J449 Chronic obstructive pulmonary disease, unspecified: Secondary | ICD-10-CM | POA: Diagnosis present

## 2019-05-17 DIAGNOSIS — D649 Anemia, unspecified: Secondary | ICD-10-CM | POA: Diagnosis not present

## 2019-05-17 DIAGNOSIS — E1122 Type 2 diabetes mellitus with diabetic chronic kidney disease: Secondary | ICD-10-CM | POA: Diagnosis present

## 2019-05-17 DIAGNOSIS — N186 End stage renal disease: Secondary | ICD-10-CM | POA: Diagnosis present

## 2019-05-17 DIAGNOSIS — K2101 Gastro-esophageal reflux disease with esophagitis, with bleeding: Secondary | ICD-10-CM | POA: Diagnosis present

## 2019-05-17 DIAGNOSIS — J441 Chronic obstructive pulmonary disease with (acute) exacerbation: Secondary | ICD-10-CM | POA: Diagnosis present

## 2019-05-17 DIAGNOSIS — K922 Gastrointestinal hemorrhage, unspecified: Secondary | ICD-10-CM | POA: Diagnosis not present

## 2019-05-17 DIAGNOSIS — Z7952 Long term (current) use of systemic steroids: Secondary | ICD-10-CM

## 2019-05-17 DIAGNOSIS — K219 Gastro-esophageal reflux disease without esophagitis: Secondary | ICD-10-CM | POA: Diagnosis not present

## 2019-05-17 DIAGNOSIS — E669 Obesity, unspecified: Secondary | ICD-10-CM | POA: Diagnosis present

## 2019-05-17 DIAGNOSIS — Z96652 Presence of left artificial knee joint: Secondary | ICD-10-CM | POA: Diagnosis present

## 2019-05-17 DIAGNOSIS — E119 Type 2 diabetes mellitus without complications: Secondary | ICD-10-CM

## 2019-05-17 DIAGNOSIS — Z981 Arthrodesis status: Secondary | ICD-10-CM

## 2019-05-17 DIAGNOSIS — Z79899 Other long term (current) drug therapy: Secondary | ICD-10-CM

## 2019-05-17 DIAGNOSIS — Z833 Family history of diabetes mellitus: Secondary | ICD-10-CM | POA: Diagnosis not present

## 2019-05-17 DIAGNOSIS — Z7951 Long term (current) use of inhaled steroids: Secondary | ICD-10-CM

## 2019-05-17 DIAGNOSIS — J42 Unspecified chronic bronchitis: Secondary | ICD-10-CM | POA: Diagnosis not present

## 2019-05-17 DIAGNOSIS — Z6830 Body mass index (BMI) 30.0-30.9, adult: Secondary | ICD-10-CM

## 2019-05-17 DIAGNOSIS — Z7984 Long term (current) use of oral hypoglycemic drugs: Secondary | ICD-10-CM

## 2019-05-17 DIAGNOSIS — G4733 Obstructive sleep apnea (adult) (pediatric): Secondary | ICD-10-CM | POA: Diagnosis present

## 2019-05-17 DIAGNOSIS — Z825 Family history of asthma and other chronic lower respiratory diseases: Secondary | ICD-10-CM

## 2019-05-17 DIAGNOSIS — D62 Acute posthemorrhagic anemia: Secondary | ICD-10-CM | POA: Diagnosis present

## 2019-05-17 DIAGNOSIS — K921 Melena: Secondary | ICD-10-CM | POA: Diagnosis present

## 2019-05-17 DIAGNOSIS — Z8249 Family history of ischemic heart disease and other diseases of the circulatory system: Secondary | ICD-10-CM

## 2019-05-17 HISTORY — DX: Sleep apnea, unspecified: G47.30

## 2019-05-17 LAB — MRSA PCR SCREENING: MRSA by PCR: NEGATIVE

## 2019-05-17 LAB — BASIC METABOLIC PANEL
Anion gap: 15 (ref 5–15)
BUN: 46 mg/dL — ABNORMAL HIGH (ref 6–20)
CO2: 25 mmol/L (ref 22–32)
Calcium: 8.5 mg/dL — ABNORMAL LOW (ref 8.9–10.3)
Chloride: 94 mmol/L — ABNORMAL LOW (ref 98–111)
Creatinine, Ser: 7.42 mg/dL — ABNORMAL HIGH (ref 0.44–1.00)
GFR calc Af Amer: 6 mL/min — ABNORMAL LOW (ref >60)
GFR calc non Af Amer: 5 mL/min — ABNORMAL LOW (ref >60)
Glucose, Bld: 144 mg/dL — ABNORMAL HIGH (ref 70–99)
Potassium: 5.5 mmol/L — ABNORMAL HIGH (ref 3.5–5.1)
Sodium: 134 mmol/L — ABNORMAL LOW (ref 135–145)

## 2019-05-17 LAB — CBC WITH DIFFERENTIAL/PLATELET
Abs Immature Granulocytes: 0.02 10*3/uL (ref 0.00–0.07)
Basophils Absolute: 0 10*3/uL (ref 0.0–0.1)
Basophils Relative: 0 %
Eosinophils Absolute: 0.1 10*3/uL (ref 0.0–0.5)
Eosinophils Relative: 1 %
HCT: 20.4 % — ABNORMAL LOW (ref 36.0–46.0)
Hemoglobin: 6.2 g/dL — CL (ref 12.0–15.0)
Immature Granulocytes: 0 %
Lymphocytes Relative: 9 %
Lymphs Abs: 0.5 10*3/uL — ABNORMAL LOW (ref 0.7–4.0)
MCH: 32.3 pg (ref 26.0–34.0)
MCHC: 30.4 g/dL (ref 30.0–36.0)
MCV: 106.3 fL — ABNORMAL HIGH (ref 80.0–100.0)
Monocytes Absolute: 0.3 10*3/uL (ref 0.1–1.0)
Monocytes Relative: 6 %
Neutro Abs: 4.6 10*3/uL (ref 1.7–7.7)
Neutrophils Relative %: 84 %
Platelets: 212 10*3/uL (ref 150–400)
RBC: 1.92 MIL/uL — ABNORMAL LOW (ref 3.87–5.11)
RDW: 16.2 % — ABNORMAL HIGH (ref 11.5–15.5)
WBC: 5.5 10*3/uL (ref 4.0–10.5)
nRBC: 0 % (ref 0.0–0.2)

## 2019-05-17 LAB — RESPIRATORY PANEL BY RT PCR (FLU A&B, COVID)
Influenza A by PCR: NEGATIVE
Influenza B by PCR: NEGATIVE
SARS Coronavirus 2 by RT PCR: NEGATIVE

## 2019-05-17 LAB — OCCULT BLOOD X 1 CARD TO LAB, STOOL: Fecal Occult Bld: POSITIVE — AB

## 2019-05-17 MED ORDER — CHLORHEXIDINE GLUCONATE CLOTH 2 % EX PADS
6.0000 | MEDICATED_PAD | Freq: Every day | CUTANEOUS | Status: DC
  Administered 2019-05-18 – 2019-05-19 (×2): 6 via TOPICAL

## 2019-05-17 MED ORDER — SODIUM CHLORIDE 0.9 % IV SOLN
10.0000 mL/h | Freq: Once | INTRAVENOUS | Status: AC
  Administered 2019-05-17: 10 mL/h via INTRAVENOUS

## 2019-05-17 MED ORDER — HYDRALAZINE HCL 50 MG PO TABS
100.0000 mg | ORAL_TABLET | Freq: Three times a day (TID) | ORAL | Status: DC
  Administered 2019-05-17 – 2019-05-21 (×11): 100 mg via ORAL
  Filled 2019-05-17 (×11): qty 2

## 2019-05-17 MED ORDER — LABETALOL HCL 200 MG PO TABS
400.0000 mg | ORAL_TABLET | Freq: Two times a day (BID) | ORAL | Status: DC
  Administered 2019-05-17 – 2019-05-18 (×2): 400 mg via ORAL
  Filled 2019-05-17 (×4): qty 2

## 2019-05-17 MED ORDER — CLONIDINE HCL 0.1 MG PO TABS
0.3000 mg | ORAL_TABLET | Freq: Two times a day (BID) | ORAL | Status: DC
  Administered 2019-05-17 – 2019-05-18 (×2): 0.3 mg via ORAL
  Filled 2019-05-17 (×2): qty 3

## 2019-05-17 MED ORDER — AMLODIPINE BESYLATE 10 MG PO TABS
10.0000 mg | ORAL_TABLET | Freq: Every day | ORAL | Status: DC
  Administered 2019-05-18 – 2019-05-20 (×3): 10 mg via ORAL
  Filled 2019-05-17 (×5): qty 1

## 2019-05-17 MED ORDER — SODIUM ZIRCONIUM CYCLOSILICATE 10 G PO PACK
10.0000 g | PACK | Freq: Every day | ORAL | Status: DC
  Administered 2019-05-17 – 2019-05-21 (×4): 10 g via ORAL
  Filled 2019-05-17 (×4): qty 1

## 2019-05-17 NOTE — Consult Note (Addendum)
[facility] Renal Consultation Note    Indication for Consultation:  Management of ESRD/hemodialysis; anemia, hypertension/volume and secondary hyperparathyroidism  HPI: Jodi Bell is a 59 y.o. female with a PMH significant for HTN, DM type 2, GERD, psoriatic arthritis, DDD s/p fusion of her cervical spine in February, chronic pain, GIB due to duodenitis and esophagitis s/p blood transfusion 04/12/19, and ESRD on HD qMWF at [facility] who presented to [facility] with worsening weakness and dizziness.  She has also noted hematochezia for the past week associated with abdominal pain.  At the ED her Hgb was noted to be 6.2 and she was transferred to [facility] for blood transfusion and further GI workup.  We were consulted to provide HD during her hospitalization.  Past Medical History:  Diagnosis Date  . Anemia   . Arthritis    oa and psoriatic ra  . Asthma   . Back pain, chronic    lower back  . Candida infection finished tx 2 days ago  . Chronic insomnia   . ESRD on HD every MWF   . COPD (chronic obstructive pulmonary disease) ([place])   . Diabetes mellitus    type 2  . Eczema   . Essential hypertension   . GERD (gastroesophageal reflux disease)   . History of hiatal hernia   . Obesity   . Pneumonia few yrs ago x 2   Past Surgical History:  Procedure Laterality Date  . BACK SURGERY  2016   lower back fusion with cage  . BIOPSY  09/23/2017   Procedure: BIOPSY;  Surgeon: Wonda Horner, MD;  Location: [facility];  Service: Endoscopy;;  . CESAREAN SECTION  1990   x 1   . COLONOSCOPY WITH PROPOFOL N/A 09/23/2017   Procedure: COLONOSCOPY WITH PROPOFOL Hemostatic clips placed;  Surgeon: Wonda Horner, MD;  Location: [facility];  Service: Endoscopy;  Laterality: N/A;  . DILATION AND CURETTAGE OF UTERUS  1988  . ESOPHAGOGASTRODUODENOSCOPY (EGD) WITH PROPOFOL N/A 09/23/2017   Procedure: ESOPHAGOGASTRODUODENOSCOPY (EGD) WITH PROPOFOL;  Surgeon: Wonda Horner,  MD;  Location: [facility];  Service: Endoscopy;  Laterality: N/A;  . PARTIAL KNEE ARTHROPLASTY Left 11/12/2017   Procedure: left unicompartmental arthroplasty-medial;  Surgeon: Paralee Cancel, MD;  Location: [facility];  Service: Orthopedics;  Laterality: Left;  32min  . SUBMUCOSAL INJECTION  09/23/2017   Procedure: SUBMUCOSAL INJECTION;  Surgeon: Wonda Horner, MD;  Location: [facility];  Service: Endoscopy;;  in colon  . TUBAL LIGATION  1990   Family History:   Family History  Problem Relation Age of Onset  . Lung cancer Mother   . Diabetes Sister   . Heart disease Sister   . Asthma Daughter   . Arthritis Daughter   . Arthritis Daughter   . Thyroid cancer Other        1/2 sister  . Heart disease Other        1/2 sister   Social History:  reports that she quit smoking about a year ago. Her smoking use included cigarettes. She has a 20.00 pack-year smoking history. She has never used smokeless tobacco. She reports current alcohol use. She reports that she does not use drugs. Allergies  Allergen Reactions  . Adalimumab Other (See Comments)    Blisters on abdomen =humira  . Allyl Isothiocyanate Swelling    SPICEY MUSTARD SWELLING OF TONGUE AND MOUTH  . Banana Swelling    TONGUE AND MOUTH SWELLING  . Ezetimibe Other (  See Comments)  . Ibuprofen Other (See Comments)    Renal Problems  . Leflunomide And Related Other (See Comments)    Severe Headache  . Lexapro [Escitalopram Oxalate] Other (See Comments)    Hand problems - Serotonin Syndrome   . Lisinopril Swelling    Angioedema   . Other Other (See Comments)    Cosentyx= angioedema   . Pistachio Nut (Diagnostic) Itching    Mouth itches and tongue swelling Hazel nuts and pecans also  . Statins Other (See Comments)    Leg pains and weakness  . Trazodone And Nefazodone Other (See Comments)    Serotonin Syndrome    Medications prior to admission: cetirizine (ZYRTEC) 10 MG tablet  Indications: urticaria Take 20 mg by mouth  as needed for Allergies or Rhinitis. Patient takes as needed   0 12/03/2013  Active  EPINEPHrine (EPI-PEN) 0.3 mg/0.3 mL auto-injector  Indications: anaphylaxis Inject 0.3 mg into the muscle once as needed for Anaphylaxis.   0 09/30/2012  Active  ferrous sulfate (FERATAB) 325 (65 FE) MG tablet  Take 325 mg by mouth 3 (three) times a week. Monday, Wednesday, Friday   0   Active  fluticasone (FLONASE) 50 mcg/actuation nasal spray  1 spray by Nasal route daily as needed for Allergies.   0   Active  folic acid (FOLVITE) 102 MCG tablet  Take 400 mcg by mouth daily.   0   Active  allopurinoL (ZYLOPRIM) 100 MG tablet  Take 100 mg by mouth daily.   0 04/14/2016  Active  budesonide-formoteroL (SYMBICORT) 160-4.5 mcg/actuation inhaler  Indications: Chronic obstructive pulmonary disease, unspecified COPD type (HCC) Inhale 2 puffs into the lungs 2 times daily for 30 days. 2 Inhaler  3 03/05/2018 01/18/2020 Active  albuterol (VENTOLIN HFA) 90 mcg/actuation inhaler  Inhale 1 puff into the lungs every 6 (six) hours as needed. 1 Inhaler  5 05/31/2018  Active  tiotropium (SPIRIVA RESPIMAT) 2.5 mcg/actuation inhaler  Inhale 2 puffs into the lungs daily. 4 g  5 07/16/2018  Active    Additional Information Patient taking differently: 2 puff Inhalation Daily as needed, shortness of breath, Informant: Self, Reported on 01/18/2019   hydrALAZINE (APRESOLINE) 100 MG tablet  TAKE 1 TABLET BY MOUTH THREE TIMES A DAY 270 tablet  3 08/25/2018  Active  olmesartan (BENICAR) 40 MG tablet  Take 1 tablet (40 mg total) by mouth daily. 30 tablet  11 09/22/2018  Active  isosorbide mononitrate ER (IMDUR ER) 60 MG 24 hr tablet  Take 1 tablet (60 mg total) by mouth 2 times daily. 60 tablet  11 09/22/2018  Active    Additional Information Patient taking differently: 60 mg Oral Daily, Informant: Self, Reported on 01/18/2019   esomeprazole (NEXIUM, NEXIUM 24HR) 20 MG capsule  Take 20 mg by mouth  daily as needed (acid reflux).   0   Active  labetaloL (NORMODYNE) 200 MG tablet  Take 400 mg by mouth 3 times daily.   0 10/31/2018  Active  calcium acetate,phosphat bind, (PHOSLO) 667 mg capsule  Take 1,334-2,001 mg by mouth 3 times daily with meals. *6 capsules per day total, split between 2-3 meals*   0 10/25/2018  Active  ondansetron (ZOFRAN) 4 MG tablet  Take 1 tablet (4 mg total) by mouth every 8 (eight) hours as needed. 20 tablet  5 11/26/2018 11/26/2019 Active  cloNIDine HCL (CATAPRES) 0.3 MG tablet  Indications: hypertension Take 1 tablet (0.3 mg total) by mouth 3 times daily. 270 tablet  1  11/30/2018  Active    Additional Information Patient taking differently: 0.3 mg Oral 2 times daily, Indications: hypertension, Informant: Self, Reported on 04/22/2019   zolpidem (AMBIEN) 5 MG tablet  Indications: Primary insomnia Take 1 tablet (5 mg total) by mouth nightly as needed for up to 30 days for Sleep. 30 tablet  5 12/01/2018 01/18/2020 Active  XELJANZ 5 mg Tab  Take 5 mg by mouth every evening.   0 12/09/2018  Active  predniSONE (DELTASONE) 5 MG tablet  Take 5 mg by mouth daily.   0 12/30/2018  Active  LORazepam (ATIVAN) 0.5 MG tablet  Take 1 tablet (0.5 mg total) by mouth every 8 (eight) hours as needed for Anxiety. 30 tablet  5 02/22/2019  Active  furosemide (LASIX) 40 MG tablet  Take 1 tablet (40 mg total) by mouth 2 times daily. 60 tablet  11 02/25/2019  Active  cinacalcet (SENSIPAR) 30 MG tablet  Take 30 mg by mouth every evening.  0   Active  folic acid/vit B complex and C (RENAL-VITE ORAL)  Take 1 tablet by mouth daily.  0   Active  ELDERBERRY FRUIT ORAL  Drops - 2 dropperfuls by mouth daily  0   Active  Bacillus coagulans/inulin (PROBIOTIC WITH PREBIOTIC ORAL)  2 gummies by mouth every evening  0   Active  acetaminophen (TYLENOL) 500 MG tablet  Take 1,000 mg by mouth as needed for Pain.  0   Active  UNABLE TO FIND  Eye Relief (omega  3-4-5) capsules - 1 capsule by mouth 3 times daily  0   Active  carboxymethylcellulose (REFRESH LIQUIGEL) 1 % ophthalmic solution  Place 1 drop into both eyes 3 times daily.  0   Active  methocarbamoL (ROBAXIN) 500 MG tablet  Indications: Cervical pain Take 1 tablet (500 mg total) by mouth 4 (four) times daily as needed. 50 tablet  1 03/21/2019  Active  senna-docusate (PERICOLACE, SENOKOT-S) 8.6-50 mg per tablet  Take 2 tablets by mouth nightly.  0 03/21/2019  Active  amLODIPine (NORVASC) 10 MG tablet  TAKE 1 TABLET BY MOUTH EVERY DAY 90 tablet  0 03/28/2019  Active  oxyCODONE (ROXICODONE) 15 MG immediate release tablet  Take 15 mg by mouth every 4 (four) hours as needed for Moderate pain.  0   Active  nebulizers (COMPACT COMPRESSOR NEBULIZER) Misc  Indications: Mild persistent asthma with acute exacerbation, Wheezing Use as directed. 1 each  0 04/15/2019  Active  albuterol 2.5 mg /3 mL (0.083 %) nebulizer solution  Indications: Mild persistent asthma with acute exacerbation Take 3 mLs (2.5 mg total) by nebulization every 6 (six) hours as needed for Wheezing. 120 mL  11 04/15/2019  Active  montelukast (SINGULAIR) 10 mg tablet  Indications: Mild persistent asthma without complication TAKE 1 TABLET BY MOUTH EVERY DAY AT NIGHT 90 tablet  1 04/20/2019  Active  JANUVIA 25 mg tablet  TAKE 1 TABLET BY MOUTH EVERY DAY 90 tablet  3 04/26/2019  Active  oxyCODONE (ROXICODONE) 10 mg IR tablet  Take 10 mg by mouth every 4 (four) hours as needed. 60 each  0 04/29/2019  Active  gabapentin (NEURONTIN) 300 MG capsule  TAKE 1 CAPSULE (300 MG TOTAL) BY MOUTH NIGHTLY AS NEEDED. 90 capsule  0 05/09/2019 08/07/2019 Active        No current facility-administered medications for this encounter.   Labs: Basic Metabolic Panel: Recent Labs  Lab 05/17/19 1510  NA 134*  K 5.5*  CL 94*  CO2  25  GLUCOSE 144*  BUN 46*  CREATININE 7.42*  CALCIUM 8.5*   Liver Function  Tests: No results for input(s): AST, ALT, ALKPHOS, BILITOT, PROT, ALBUMIN in the last 168 hours. No results for input(s): LIPASE, AMYLASE in the last 168 hours. No results for input(s): AMMONIA in the last 168 hours. CBC: Recent Labs  Lab 05/17/19 1510  WBC 5.5  NEUTROABS 4.6  HGB 6.2*  HCT 20.4*  MCV 106.3*  PLT 212   Cardiac Enzymes: No results for input(s): CKTOTAL, CKMB, CKMBINDEX, TROPONINI in the last 168 hours. CBG: No results for input(s): GLUCAP in the last 168 hours. Iron Studies: No results for input(s): IRON, TIBC, TRANSFERRIN, FERRITIN in the last 72 hours. Studies/Results: No results found.  ROS: A comprehensive review of systems was negative except for: Constitutional: positive for dizziness and weakness Gastrointestinal: positive for abdominal pain, vomiting and hematochezia Musculoskeletal: positive for arthralgias, muscle weakness and neck pain Physical Exam: Vitals:   05/17/19 1700 05/17/19 1727 05/17/19 1931 05/17/19 2000  BP:  (!) 143/70 (!) 162/70 (!) 186/78  Pulse: 70 75 79 83  Resp: 20 19 19 20   Temp:    98 F (36.7 C)  TempSrc:    Oral  SpO2: 96% 95% 96% 100%  Weight:      Height:          Weight change:  No intake or output data in the 24 hours ending 05/17/19 2204 BP (!) 186/78 (BP Location: Right Wrist)   Pulse 83   Temp 98 F (36.7 C) (Oral)   Resp 20   Ht 5' 2.75" (1.594 m)   Wt 71.2 kg   SpO2 100%   BMI 28.03 kg/m  General appearance: alert, cooperative and no distress Head: Normocephalic, without obvious abnormality, atraumatic Eyes: negative findings: lids and lashes normal, conjunctivae and sclerae normal and corneas clear Resp: clear to auscultation bilaterally Cardio: regular rate and rhythm, S1, S2 normal, no murmur, click, rub or gallop GI: +BS, soft, mild tenderness to deep palpation of lower abdomen, no guarding or rebound Extremities: extremities normal, atraumatic, no cyanosis or edema and LUE AVF +T/B Dialysis  Access:  Dialysis Orders: Center: [facility]  on MWF . EDW 73 kg (below edw) HD [place] 2K/2.5Ca  Time 3:30 Heparin none. Access LUE AVF BFR 400 DFR 700       Assessment/Plan: 1.  ABLA due to GI bleed- agree with transfusion of 1 unit tonight and recheck in am.  She may need another transfusion tomorrow with HD 2.  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  Plan for HD first thing tomorrow morning, no heparin.  Will temporize hyperkalemia with lokelma until HD tomorrow morning. 3. Hyperkalemia- mild, will treat with lokelma and plan for HD first thing in the am 4.  Hypertension/volume  - resume home bp meds and follow 5.  Anemia  - as above, transfuse and follow 6.  Metabolic bone disease -  Cont with home meds 7.  Nutrition - renal diet, carb modified, npo after midnight for likely EGD tomorrow. 8. Chronic pain- ok to resume meds  Donetta Potts, MD [place] Pager [phone] 05/17/2019, 10:04 PM

## 2019-05-17 NOTE — ED Triage Notes (Signed)
Dialysis yesterday. Pain in both arms afterward. She went to pain management today but left due not feeling well. States she is anemic. She had a blood transfusion recently. She has multiple complaints.

## 2019-05-17 NOTE — ED Provider Notes (Signed)
[place] EMERGENCY DEPARTMENT Provider Note   CSN: [id number] Arrival date & time: 05/17/19  1258     History Chief Complaint  Patient presents with  . Fatigue    Jodi Bell is a 59 y.o. female with history of ESRD on dialysis Monday Wednesday Friday, diabetes mellitus, hypertension, GERD, hiatal hernia, obesity, anemia, arthritis, asthma presents for evaluation of acute onset, persistent and progressively worsening fatigue beginning today.  She reports "feeling as though I need a blood transfusion".  She was last transfused yesterday, reports that she is approximately 30 minutes short of treatment but was dialyzed for 3.5 hours.  She tells me that yesterday evening while she was attempting to lay down for bed she felt "severe bone pain" radiating from her neck to the bilateral upper extremities.  She reports this lasted for several hours and was unrelieved by 20 mg of oxycodone and 2 extra strength Tylenol but that she finally fell asleep around 5 AM and awoke with no pain.  She is status post cervical spine surgery in February and is scheduled to see her neurosurgeon later this month.  She denies numbness or weakness of the extremities, chest pain, shortness of breath.  Yesterday she did have an episode of nonbloody nonbilious emesis and reports chronic upper abdominal pains consistent with pain she has experienced with her hiatal hernia.  She reports that on Friday she had some wine for her birthday and since then her stools have been dark and tarry but that this is not necessarily unusual for her when she takes her iron supplements.  Chart review shows she was admitted to [facility] for symptomatic anemia on 04/12/19. She underwent EGD that "showed distal esophagus was mildly friable with several tiny 1 to 2 mm duodenal erosions with no evidence of active bleeding impression was esophagitis duodenitis".  She was transfused 1 unit packed red blood cells and started on a  PPI during that hospitalization.  Per discharge summary noted through care everywhere the plan was to continue monitoring the patient's hemoglobin but the patient left the hospital AMA on 04/13/2019 at 7:45 PM.   The history is provided by the patient.       Past Medical History:  Diagnosis Date  . Anemia   . Arthritis    oa and psoriatic ra  . Asthma   . Back pain, chronic    lower back  . Candida infection finished tx 2 days ago  . Chronic insomnia   . Chronic kidney disease    stage 3 ckd last ov nephrology 04-29-17 epic  . Chronic kidney disease (CKD) stage G4/A1, severely decreased glomerular filtration rate (GFR) between 15-29 mL/min/1.73 square meter and albuminuria creatinine ratio less than 30 mg/g (HCC)   . COPD (chronic obstructive pulmonary disease) ([place])   . Diabetes mellitus    type 2  . Eczema   . Essential hypertension   . GERD (gastroesophageal reflux disease)   . History of hiatal hernia   . Obesity   . Pneumonia few yrs ago x 2    Patient Active Problem List   Diagnosis Date Noted  . GI bleeding 05/17/2019  . Volume overload state of heart 09/14/2018  . Intervertebral cervical disc disorder with myelopathy, cervical region 09/14/2018  . OSA (obstructive sleep apnea) 09/14/2018  . Asthma 05/16/2018  . Suspected COVID-19 virus infection 05/16/2018  . ESRD (end stage renal disease) ([place]) 05/16/2018  . Fluid overload 02/27/2018  . Chest pain, rule out  acute myocardial infarction 12/12/2017  . Status post left partial knee replacement 11/12/2017  . Hematochezia 09/20/2017  . GERD (gastroesophageal reflux disease) 09/20/2017  . 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 2 diabetes mellitus ([place]) 09/20/2017  . COPD (chronic obstructive pulmonary disease) ([place]) 09/20/2017  . Tobacco use 09/20/2017  . Symptomatic anemia 09/20/2017  . Headache 09/20/2017  . Serotonin syndrome 09/28/2013  . Acute respiratory failure with hypoxia ([place]) 09/28/2013  . Encephalopathy acute  09/28/2013  . Hypotension, unspecified 09/28/2013  . 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 Type 2 diabetes mellitus ([place]) 03/06/2008  . INSOMNIA, CHRONIC 03/06/2008  . Essential hypertension 03/06/2008  . ASTHMA 03/06/2008  . ECZEMA 03/06/2008  . BACK PAIN, LUMBAR, CHRONIC 03/06/2008    Past Surgical History:  Procedure Laterality Date  . BACK SURGERY  2016   lower back fusion with cage  . BIOPSY  09/23/2017   Procedure: BIOPSY;  Surgeon: Wonda Horner, MD;  Location: [facility] ENDOSCOPY;  Service: Endoscopy;;  . CESAREAN SECTION  1990   x 1   . COLONOSCOPY WITH PROPOFOL N/A 09/23/2017   Procedure: COLONOSCOPY WITH PROPOFOL Hemostatic clips placed;  Surgeon: Wonda Horner, MD;  Location: [facility]OS[facility];  Service: Endoscopy;  Laterality: N/A;  . DILATION AND CURETTAGE OF UTERUS  1988  . ESOPHAGOGASTRODUODENOSCOPY (EGD) WITH PROPOFOL N/A 09/23/2017   Procedure: ESOPHAGOGASTRODUODENOSCOPY (EGD) WITH PROPOFOL;  Surgeon: Wonda Horner, MD;  Location: [facility];  Service: Endoscopy;  Laterality: N/A;  . PARTIAL KNEE ARTHROPLASTY Left 11/12/2017   Procedure: left unicompartmental arthroplasty-medial;  Surgeon: Paralee Cancel, MD;  Location: [facility] ORS;  Service: Orthopedics;  Laterality: Left;  48min  . SUBMUCOSAL INJECTION  09/23/2017   Procedure: SUBMUCOSAL INJECTION;  Surgeon: Wonda Horner, MD;  Location: [facility];  Service: Endoscopy;;  in colon  . TUBAL LIGATION  1990     OB History   No obstetric history on file.     Family History  Problem Relation Age of Onset  . Lung cancer Mother   . Diabetes Sister   . Heart disease Sister   . Asthma Daughter   . Arthritis Daughter   . Arthritis Daughter   . Thyroid cancer Other        1/2 sister  . Heart disease Other        1/2 sister    Social History   Tobacco Use  . Smoking status: Former Smoker    Packs/day: 0.50    Years: 40.00    Pack years: 20.00    Types: Cigarettes    Quit date: 05/15/2018    Years since  quitting: 1.0  . Smokeless tobacco: Never Used  Substance Use Topics  . Alcohol use: Yes    Comment: occ  . Drug use: No    Home Medications Prior to Admission medications   Medication Sig Start Date End Date Taking? Authorizing Provider  allopurinol (ZYLOPRIM) 300 MG tablet Take 150 mg by mouth daily.     [provider]  amLODipine (NORVASC) 10 MG tablet Take 10 mg by mouth daily.    [provider]  bisoprolol (ZEBETA) 10 MG tablet Take 2 tablets (20 mg total) by mouth daily. 12/14/17   Bonnell Public, MD  budesonide-formoterol (SYMBICORT) 160-4.5 MCG/ACT inhaler Inhale 2 puffs into the lungs 2 (two) times daily.    [provider]  cetirizine (ZYRTEC) 10 MG tablet Take 20 mg by mouth 2 (two) times daily.  06/13/17   Shelda Pal, DO  cloNIDine (CATAPRES) 0.2 MG tablet Take 0.2 mg by mouth 2 (two) times daily.  07/15/15   [provider]  EPINEPHrine (EPIPEN) 0.3 mg/0.3 mL SOAJ injection Inject 0.3 mLs (0.3 mg total) into the muscle once. Patient taking differently: Inject 0.3 mg into the muscle as needed (for allergic reaction).  09/30/12   Le, Thao P, DO  esomeprazole (NEXIUM) 20 MG capsule Take 20 mg by mouth every evening.     [provider]  ezetimibe (ZETIA) 10 MG tablet TAKE 1 TABLET BY MOUTH EVERY DAY 03/22/18   Adrian Prows, MD  ferrous sulfate 325 (65 FE) MG tablet Take 325 mg by mouth daily.     [provider]  fluticasone (FLONASE) 50 MCG/ACT nasal spray Place 1 spray into both nostrils daily as needed for allergies or rhinitis.     [provider]  folic acid (FOLVITE) 378 MCG tablet Take 400 mcg by mouth daily.     [provider]  furosemide (LASIX) 40 MG tablet Take 40 mg by mouth 2 (two) times daily.     [provider]  gabapentin (NEURONTIN) 300 MG capsule Take 1 capsule (300 mg total) by mouth at bedtime as needed (Neuropathic pain). 12/14/17 12/14/18  Dana Allan I, MD   hydrALAZINE (APRESOLINE) 50 MG tablet Take 1 tablet (50 mg total) by mouth every 8 (eight) hours. 12/14/17   Bonnell Public, MD  isosorbide mononitrate (IMDUR) 60 MG 24 hr tablet Take 1 tablet (60 mg total) by mouth daily. 03/02/18   Thurnell Lose, MD  JANUVIA 100 MG tablet Take 100 mg by mouth daily.  08/09/15   [provider]  montelukast (SINGULAIR) 10 MG tablet Take 10 mg by mouth at bedtime.    [provider]  Multiple Vitamin (MULTIVITAMIN) tablet Take 1 tablet by mouth daily.    [provider]  ondansetron (ZOFRAN) 4 MG tablet Take 1 tablet (4 mg total) by mouth every 8 (eight) hours as needed for nausea or vomiting. 05/20/18 05/20/19  Aline August, MD  Oxycodone HCl 10 MG TABS Take 1 tablet (10 mg total) by mouth every 6 (six) hours as needed (pain score 7-10). 09/15/18   Black, Lezlie Octave, NP  predniSONE (DELTASONE) 2.5 MG tablet Take 2.5 mg by mouth daily with breakfast.    [provider]  VENTOLIN HFA 108 (90 Base) MCG/ACT inhaler Inhale 1-2 puffs into the lungs every 6 (six) hours as needed for shortness of breath. wheezing 06/13/15   [provider]  Vitamin D, Ergocalciferol, (DRISDOL) 1.25 MG (50000 UT) CAPS capsule Take 50,000 Units by mouth every 7 (seven) days.    [provider]  Vitamin D, Ergocalciferol, (DRISDOL) 50000 units CAPS capsule Take 50,000 Units by mouth every Tuesday.  09/15/17   [provider]  zolpidem (AMBIEN) 10 MG tablet Take 10 mg by mouth at bedtime.     [provider]    Allergies    Adalimumab, Allyl isothiocyanate, Banana, Ezetimibe, Ibuprofen, Leflunomide and related, Lexapro [escitalopram oxalate], Lisinopril, Other, Pistachio nut (diagnostic), Statins, and Trazodone and nefazodone  Review of Systems   Review of Systems  Constitutional: Positive for fatigue. Negative for chills and fever.  Respiratory: Negative for shortness of breath.   Cardiovascular: Negative for chest pain.   Gastrointestinal: Positive for abdominal pain (chronic, unchanged), nausea and vomiting.  Neurological: Positive for weakness (generally) and light-headedness.  All other systems reviewed and are negative.   Physical Exam Updated Vital Signs BP Marland Kitchen)  181/74 (BP Location: Right Wrist)   Pulse 82   Temp 98.2 F (36.8 C) (Oral)   Resp 20   Ht 5' 2.75" (1.594 m)   Wt 71.2 kg   SpO2 100%   BMI 28.03 kg/m   Physical Exam Vitals and nursing note reviewed.  Constitutional:      General: She is not in acute distress.    Appearance: She is well-developed.  HENT:     Head: Normocephalic and atraumatic.  Eyes:     General:        Right eye: No discharge.        Left eye: No discharge.     Conjunctiva/sclera: Conjunctivae normal.  Neck:     Vascular: No JVD.     Trachea: No tracheal deviation.     Comments: Aspen collar in place Cardiovascular:     Rate and Rhythm: Normal rate and regular rhythm.  Pulmonary:     Effort: Pulmonary effort is normal.     Breath sounds: Normal breath sounds.  Abdominal:     General: Abdomen is protuberant. Bowel sounds are normal. There is no distension.     Palpations: Abdomen is soft.     Tenderness: There is abdominal tenderness in the right upper quadrant, epigastric area and left upper quadrant. There is no right CVA tenderness, left CVA tenderness, guarding or rebound.  Musculoskeletal:     Cervical back: Neck supple.  Skin:    General: Skin is warm and dry.     Capillary Refill: Capillary refill takes less than 2 seconds.     Findings: No erythema.  Neurological:     Mental Status: She is alert.  Psychiatric:        Behavior: Behavior normal.     ED Results / Procedures / Treatments   Labs (all labs ordered are listed, but only abnormal results are displayed) Labs Reviewed  BASIC METABOLIC PANEL - Abnormal; Notable for the following components:      Result Value   Sodium 134 (*)    Potassium 5.5 (*)    Chloride 94 (*)     Glucose, Bld 144 (*)    BUN 46 (*)    Creatinine, Ser 7.42 (*)    Calcium 8.5 (*)    GFR calc non Af Amer 5 (*)    GFR calc Af Amer 6 (*)    All other components within normal limits  CBC WITH DIFFERENTIAL/PLATELET - Abnormal; Notable for the following components:   RBC 1.92 (*)    Hemoglobin 6.2 (*)    HCT 20.4 (*)    MCV 106.3 (*)    RDW 16.2 (*)    Lymphs Abs 0.5 (*)    All other components within normal limits  OCCULT BLOOD X 1 CARD TO LAB, STOOL - Abnormal; Notable for the following components:   Fecal Occult Bld POSITIVE (*)    All other components within normal limits  RESPIRATORY PANEL BY RT PCR (FLU A&B, COVID)  MRSA PCR SCREENING  RENAL FUNCTION PANEL  CBC  PREPARE RBC (CROSSMATCH)  TYPE AND SCREEN    EKG None  Radiology No results found.  Procedures .Critical Care Performed by: Renita Papa, PA-C Authorized by: Renita Papa, PA-C   Critical care provider statement:    Critical care time (minutes):  45   Critical care was necessary to treat or prevent imminent or life-threatening deterioration of the following conditions:  Circulatory failure   Critical care was time spent personally  by me on the following activities:  Discussions with consultants, evaluation of patient's response to treatment, examination of patient, ordering and performing treatments and interventions, ordering and review of laboratory studies, ordering and review of radiographic studies, pulse oximetry, re-evaluation of patient's condition, obtaining history from patient or surrogate and review of old charts   (including critical care time)  Medications Ordered in ED Medications  sodium zirconium cyclosilicate (LOKELMA) packet 10 g (10 g Oral Given 05/17/19 2308)  cloNIDine (CATAPRES) tablet 0.3 mg (0.3 mg Oral Given 05/17/19 2305)  amLODipine (NORVASC) tablet 10 mg (10 mg Oral Not Given 05/17/19 2309)  labetalol (NORMODYNE) tablet 400 mg (400 mg Oral Given 05/17/19 2306)  hydrALAZINE  (APRESOLINE) tablet 100 mg (100 mg Oral Given 05/17/19 2305)  Chlorhexidine Gluconate Cloth 2 % PADS 6 each (has no administration in time range)  0.9 %  sodium chloride infusion (10 mL/hr Intravenous New Bag/Given 05/17/19 1859)    ED Course  I have reviewed the triage vital signs and the nursing notes.  Pertinent labs & imaging results that were available during my care of the patient were reviewed by me and considered in my medical decision making (see chart for details).    MDM Rules/Calculators/A&P                      Patient presenting for evaluation of generalized weakness.  She is afebrile, intermittently hypertensive in the ED.  She is nontoxic in appearance.  Lab work reviewed and interpreted by myself is significant for anemia with hemoglobin of 6.2, down from 8 after recent admission last month at outside hospital for symptomatic anemia with acute GI bleed.  It appears her baseline is around 8.  Her stools are heme positive.  Remainder of lab work is significant for BUN and creatinine consistent with ESRD, mild hyperkalemia with potassium of 5.5 however EKG shows no concerning changes, QT intervals within normal limits.  CONSULT: Spoke with Azucena Freed, PA with [facility] gastroenterology.  They will plan to see the patient tomorrow.  Spoke with Dr. Doristine Bosworth with [facility] at [facility].  The patient will require transfer to [facility] for admission to receive blood transfusion and for dialysis. She requests transfer to ED to ED to help expedite her transfusion as well as consult to the nephrologist for further recommendations.   CONSULT: Spoke with Dr. Marval Regal who is also in agreement with transfer ED to ED and will see the patient emergently when she arrives to [facility].  Spoke with Dr. Vanita Panda who accepts for transfer.  Final Clinical Impression(s) / ED Diagnoses Final diagnoses:  Symptomatic anemia  Hyperkalemia  ESRD on dialysis [facility])  Heme  positive stool    Rx / DC Orders ED Discharge Orders    None       Debroah Baller 05/17/19 2352    Margette Fast, MD 05/19/19 [phone]

## 2019-05-17 NOTE — ED Notes (Signed)
Snack & juice provided.

## 2019-05-17 NOTE — ED Notes (Signed)
Attempted to call report to [facility], left name and # for call back. [facility] en route with pt.

## 2019-05-18 DIAGNOSIS — I10 Essential (primary) hypertension: Secondary | ICD-10-CM

## 2019-05-18 DIAGNOSIS — K922 Gastrointestinal hemorrhage, unspecified: Secondary | ICD-10-CM

## 2019-05-18 DIAGNOSIS — D649 Anemia, unspecified: Secondary | ICD-10-CM

## 2019-05-18 DIAGNOSIS — K219 Gastro-esophageal reflux disease without esophagitis: Secondary | ICD-10-CM

## 2019-05-18 DIAGNOSIS — J42 Unspecified chronic bronchitis: Secondary | ICD-10-CM

## 2019-05-18 DIAGNOSIS — N186 End stage renal disease: Secondary | ICD-10-CM

## 2019-05-18 LAB — CBC
HCT: 19.1 % — ABNORMAL LOW (ref 36.0–46.0)
HCT: 22 % — ABNORMAL LOW (ref 36.0–46.0)
Hemoglobin: 6 g/dL — CL (ref 12.0–15.0)
Hemoglobin: 6.9 g/dL — CL (ref 12.0–15.0)
MCH: 31.5 pg (ref 26.0–34.0)
MCH: 33.5 pg (ref 26.0–34.0)
MCHC: 31.4 g/dL (ref 30.0–36.0)
MCHC: 31.4 g/dL (ref 30.0–36.0)
MCV: 100.5 fL — ABNORMAL HIGH (ref 80.0–100.0)
MCV: 106.7 fL — ABNORMAL HIGH (ref 80.0–100.0)
Platelets: 215 10*3/uL (ref 150–400)
Platelets: 218 10*3/uL (ref 150–400)
RBC: 1.79 MIL/uL — ABNORMAL LOW (ref 3.87–5.11)
RBC: 2.19 MIL/uL — ABNORMAL LOW (ref 3.87–5.11)
RDW: 16.2 % — ABNORMAL HIGH (ref 11.5–15.5)
RDW: 18.1 % — ABNORMAL HIGH (ref 11.5–15.5)
WBC: 5.4 10*3/uL (ref 4.0–10.5)
WBC: 5.4 10*3/uL (ref 4.0–10.5)
nRBC: 0 % (ref 0.0–0.2)
nRBC: 0 % (ref 0.0–0.2)

## 2019-05-18 LAB — GLUCOSE, CAPILLARY: Glucose-Capillary: 121 mg/dL — ABNORMAL HIGH (ref 70–99)

## 2019-05-18 LAB — RENAL FUNCTION PANEL
Albumin: 3.2 g/dL — ABNORMAL LOW (ref 3.5–5.0)
Albumin: 3.2 g/dL — ABNORMAL LOW (ref 3.5–5.0)
Anion gap: 14 (ref 5–15)
Anion gap: 17 — ABNORMAL HIGH (ref 5–15)
BUN: 49 mg/dL — ABNORMAL HIGH (ref 6–20)
BUN: 54 mg/dL — ABNORMAL HIGH (ref 6–20)
CO2: 25 mmol/L (ref 22–32)
CO2: 27 mmol/L (ref 22–32)
Calcium: 8.7 mg/dL — ABNORMAL LOW (ref 8.9–10.3)
Calcium: 8.9 mg/dL (ref 8.9–10.3)
Chloride: 95 mmol/L — ABNORMAL LOW (ref 98–111)
Chloride: 97 mmol/L — ABNORMAL LOW (ref 98–111)
Creatinine, Ser: 8.2 mg/dL — ABNORMAL HIGH (ref 0.44–1.00)
Creatinine, Ser: 8.76 mg/dL — ABNORMAL HIGH (ref 0.44–1.00)
GFR calc Af Amer: 5 mL/min — ABNORMAL LOW (ref >60)
GFR calc Af Amer: 6 mL/min — ABNORMAL LOW (ref >60)
GFR calc non Af Amer: 4 mL/min — ABNORMAL LOW (ref >60)
GFR calc non Af Amer: 5 mL/min — ABNORMAL LOW (ref >60)
Glucose, Bld: 138 mg/dL — ABNORMAL HIGH (ref 70–99)
Glucose, Bld: 158 mg/dL — ABNORMAL HIGH (ref 70–99)
Phosphorus: 6.2 mg/dL — ABNORMAL HIGH (ref 2.5–4.6)
Phosphorus: 7.2 mg/dL — ABNORMAL HIGH (ref 2.5–4.6)
Potassium: 4.2 mmol/L (ref 3.5–5.1)
Potassium: 5 mmol/L (ref 3.5–5.1)
Sodium: 137 mmol/L (ref 135–145)
Sodium: 138 mmol/L (ref 135–145)

## 2019-05-18 LAB — PREPARE RBC (CROSSMATCH)

## 2019-05-18 LAB — HEMOGLOBIN AND HEMATOCRIT, BLOOD
HCT: 29.7 % — ABNORMAL LOW (ref 36.0–46.0)
Hemoglobin: 9.9 g/dL — ABNORMAL LOW (ref 12.0–15.0)

## 2019-05-18 LAB — HEPATITIS B SURFACE ANTIGEN: Hepatitis B Surface Ag: NONREACTIVE

## 2019-05-18 LAB — HIV ANTIBODY (ROUTINE TESTING W REFLEX): HIV Screen 4th Generation wRfx: NONREACTIVE

## 2019-05-18 MED ORDER — ACETAMINOPHEN 500 MG PO TABS
1000.0000 mg | ORAL_TABLET | Freq: Four times a day (QID) | ORAL | Status: DC | PRN
  Administered 2019-05-18 – 2019-05-21 (×6): 1000 mg via ORAL
  Filled 2019-05-18 (×6): qty 2

## 2019-05-18 MED ORDER — ZOLPIDEM TARTRATE 5 MG PO TABS
5.0000 mg | ORAL_TABLET | Freq: Every evening | ORAL | Status: DC | PRN
  Administered 2019-05-18 – 2019-05-20 (×3): 5 mg via ORAL
  Filled 2019-05-18 (×3): qty 1

## 2019-05-18 MED ORDER — CLONIDINE HCL 0.1 MG PO TABS
0.3000 mg | ORAL_TABLET | Freq: Three times a day (TID) | ORAL | Status: DC
  Administered 2019-05-18 – 2019-05-21 (×8): 0.3 mg via ORAL
  Filled 2019-05-18 (×8): qty 3

## 2019-05-18 MED ORDER — INSULIN ASPART 100 UNIT/ML ~~LOC~~ SOLN
0.0000 [IU] | Freq: Three times a day (TID) | SUBCUTANEOUS | Status: DC
  Administered 2019-05-19: 1 [IU] via SUBCUTANEOUS

## 2019-05-18 MED ORDER — MOMETASONE FURO-FORMOTEROL FUM 200-5 MCG/ACT IN AERO
2.0000 | INHALATION_SPRAY | Freq: Two times a day (BID) | RESPIRATORY_TRACT | Status: DC
  Administered 2019-05-19 – 2019-05-20 (×2): 2 via RESPIRATORY_TRACT
  Filled 2019-05-18: qty 8.8

## 2019-05-18 MED ORDER — ISOSORBIDE MONONITRATE ER 60 MG PO TB24
60.0000 mg | ORAL_TABLET | Freq: Every day | ORAL | Status: DC
  Administered 2019-05-18 – 2019-05-21 (×4): 60 mg via ORAL
  Filled 2019-05-18 (×4): qty 1

## 2019-05-18 MED ORDER — IRBESARTAN 150 MG PO TABS
150.0000 mg | ORAL_TABLET | Freq: Every day | ORAL | Status: DC
  Administered 2019-05-18 – 2019-05-21 (×4): 150 mg via ORAL
  Filled 2019-05-18 (×4): qty 1

## 2019-05-18 MED ORDER — SODIUM CHLORIDE 0.9 % IV SOLN: 100.0000 mL | INTRAVENOUS | Status: DC | PRN

## 2019-05-18 MED ORDER — FOLIC ACID 1 MG PO TABS
1.0000 mg | ORAL_TABLET | Freq: Every day | ORAL | Status: DC
  Administered 2019-05-20 – 2019-05-21 (×2): 1 mg via ORAL
  Filled 2019-05-18 (×2): qty 1

## 2019-05-18 MED ORDER — PREDNISONE 10 MG PO TABS
5.0000 mg | ORAL_TABLET | Freq: Every day | ORAL | Status: DC
  Administered 2019-05-20 – 2019-05-21 (×2): 5 mg via ORAL
  Filled 2019-05-18 (×2): qty 1

## 2019-05-18 MED ORDER — ZOLPIDEM TARTRATE 5 MG PO TABS: 5.0000 mg | ORAL_TABLET | Freq: Every day | ORAL | Status: DC

## 2019-05-18 MED ORDER — LORAZEPAM 0.5 MG PO TABS
0.5000 mg | ORAL_TABLET | Freq: Three times a day (TID) | ORAL | Status: DC | PRN
  Administered 2019-05-19: 0.5 mg via ORAL
  Filled 2019-05-18: qty 1

## 2019-05-18 MED ORDER — PENTAFLUOROPROP-TETRAFLUOROETH EX AERO: 1.0000 "application " | INHALATION_SPRAY | CUTANEOUS | Status: DC | PRN

## 2019-05-18 MED ORDER — METHOCARBAMOL 500 MG PO TABS
500.0000 mg | ORAL_TABLET | Freq: Four times a day (QID) | ORAL | Status: DC | PRN
  Administered 2019-05-19 – 2019-05-21 (×6): 500 mg via ORAL
  Filled 2019-05-18 (×7): qty 1

## 2019-05-18 MED ORDER — SODIUM CHLORIDE 0.9% IV SOLUTION: Freq: Once | INTRAVENOUS | Status: DC

## 2019-05-18 MED ORDER — CINACALCET HCL 30 MG PO TABS
30.0000 mg | ORAL_TABLET | Freq: Every day | ORAL | Status: DC
  Administered 2019-05-20 – 2019-05-21 (×2): 30 mg via ORAL
  Filled 2019-05-18 (×3): qty 1

## 2019-05-18 MED ORDER — PANTOPRAZOLE SODIUM 40 MG IV SOLR
40.0000 mg | Freq: Two times a day (BID) | INTRAVENOUS | Status: DC
  Administered 2019-05-18 – 2019-05-21 (×6): 40 mg via INTRAVENOUS
  Filled 2019-05-18 (×6): qty 40

## 2019-05-18 MED ORDER — FUROSEMIDE 40 MG PO TABS
40.0000 mg | ORAL_TABLET | Freq: Two times a day (BID) | ORAL | Status: DC
  Administered 2019-05-18 – 2019-05-21 (×5): 40 mg via ORAL
  Filled 2019-05-18 (×5): qty 1

## 2019-05-18 MED ORDER — PREDNISONE 10 MG PO TABS: 5.0000 mg | ORAL_TABLET | Freq: Every day | ORAL | Status: DC

## 2019-05-18 MED ORDER — MONTELUKAST SODIUM 10 MG PO TABS
10.0000 mg | ORAL_TABLET | Freq: Every day | ORAL | Status: DC
  Administered 2019-05-19 – 2019-05-20 (×2): 10 mg via ORAL
  Filled 2019-05-18 (×2): qty 1

## 2019-05-18 MED ORDER — FERROUS SULFATE 325 (65 FE) MG PO TABS
325.0000 mg | ORAL_TABLET | ORAL | Status: DC
  Administered 2019-05-19: 325 mg via ORAL
  Filled 2019-05-18: qty 1

## 2019-05-18 MED ORDER — SENNA 8.6 MG PO TABS
1.0000 | ORAL_TABLET | Freq: Every day | ORAL | Status: DC
  Administered 2019-05-18 – 2019-05-21 (×3): 8.6 mg via ORAL
  Filled 2019-05-18 (×3): qty 1

## 2019-05-18 MED ORDER — LABETALOL HCL 200 MG PO TABS
400.0000 mg | ORAL_TABLET | Freq: Three times a day (TID) | ORAL | Status: DC
  Administered 2019-05-18 – 2019-05-21 (×8): 400 mg via ORAL
  Filled 2019-05-18 (×11): qty 2

## 2019-05-18 MED ORDER — METHOCARBAMOL 500 MG PO TABS
500.0000 mg | ORAL_TABLET | Freq: Once | ORAL | Status: AC
  Administered 2019-05-18: 500 mg via ORAL
  Filled 2019-05-18: qty 1

## 2019-05-18 MED ORDER — ALLOPURINOL 100 MG PO TABS
100.0000 mg | ORAL_TABLET | Freq: Every evening | ORAL | Status: DC
  Administered 2019-05-18 – 2019-05-20 (×3): 100 mg via ORAL
  Filled 2019-05-18 (×3): qty 1

## 2019-05-18 MED ORDER — LIDOCAINE-PRILOCAINE 2.5-2.5 % EX CREA: 1.0000 "application " | TOPICAL_CREAM | CUTANEOUS | Status: DC | PRN

## 2019-05-18 MED ORDER — UMECLIDINIUM BROMIDE 62.5 MCG/INH IN AEPB
1.0000 | INHALATION_SPRAY | Freq: Every day | RESPIRATORY_TRACT | Status: DC
  Administered 2019-05-19: 1 via RESPIRATORY_TRACT
  Filled 2019-05-18: qty 7

## 2019-05-18 MED ORDER — CALCIUM ACETATE (PHOS BINDER) 667 MG PO CAPS
2001.0000 mg | ORAL_CAPSULE | Freq: Three times a day (TID) | ORAL | Status: DC
  Administered 2019-05-19 – 2019-05-21 (×6): 2001 mg via ORAL
  Filled 2019-05-18 (×6): qty 3

## 2019-05-18 MED ORDER — SODIUM CHLORIDE 0.9 % IV SOLN: INTRAVENOUS | Status: DC

## 2019-05-18 MED ORDER — LIDOCAINE HCL (PF) 1 % IJ SOLN: 5.0000 mL | INTRAMUSCULAR | Status: DC | PRN

## 2019-05-18 MED ORDER — INSULIN ASPART 100 UNIT/ML ~~LOC~~ SOLN: 0.0000 [IU] | Freq: Every day | SUBCUTANEOUS | Status: DC

## 2019-05-18 MED ORDER — OXYCODONE HCL 5 MG PO TABS
10.0000 mg | ORAL_TABLET | Freq: Four times a day (QID) | ORAL | Status: DC | PRN
  Administered 2019-05-20: 10 mg via ORAL
  Filled 2019-05-18: qty 2

## 2019-05-18 MED ORDER — FLUTICASONE PROPIONATE 50 MCG/ACT NA SUSP
1.0000 | Freq: Every day | NASAL | Status: DC
  Administered 2019-05-20 – 2019-05-21 (×2): 1 via NASAL
  Filled 2019-05-18: qty 16

## 2019-05-18 MED ORDER — GABAPENTIN 300 MG PO CAPS
300.0000 mg | ORAL_CAPSULE | Freq: Every day | ORAL | Status: DC
  Administered 2019-05-18 – 2019-05-20 (×3): 300 mg via ORAL
  Filled 2019-05-18 (×3): qty 1

## 2019-05-18 MED ORDER — ALBUTEROL SULFATE (2.5 MG/3ML) 0.083% IN NEBU: 2.5000 mg | INHALATION_SOLUTION | Freq: Four times a day (QID) | RESPIRATORY_TRACT | Status: DC | PRN

## 2019-05-18 NOTE — Evaluation (Signed)
Physical Therapy Evaluation Patient Details Name: Jodi Bell MRN: [id number] DOB: [birth year] Today's Date: 05/18/2019   History of Present Illness  Jodi Bell is a 59 y.o. female with a PMH significant for HTN, DM type 2, GERD, psoriatic arthritis, DDD s/p fusion of her cervical spine in February, chronic pain, GIB due to duodenitis and esophagitis s/p blood transfusion 04/12/19, and ESRD on HD qMWF admitted with anemia due to GI bleed.  Clinical Impression  Patient presents with mobility limited due to recent anemia and generalized weakness.  She remains close to baseline, but will benefit from skilled PT in the acute setting to allow return home with steps needed to access kitchen. Receiving HHPT prior to admission so recommend resume PT upon d/c.     Follow Up Recommendations Home health PT(already set up with [facility])    Equipment Recommendations  Other (comment)(toilet riser)    Recommendations for Other Services       Precautions / Restrictions Precautions Precautions: Fall Required Braces or Orthoses: Cervical Brace Cervical Brace: Hard collar;For comfort Restrictions Other Position/Activity Restrictions: cervical fusion 03/2019      Mobility  Bed Mobility               General bed mobility comments: up in chair  Transfers Overall transfer level: Needs assistance Equipment used: Straight cane Transfers: Sit to/from Stand Sit to Stand: Supervision         General transfer comment: for safety with lines  Ambulation/Gait Ambulation/Gait assistance: Supervision Gait Distance (Feet): 150 Feet Assistive device: Straight cane Gait Pattern/deviations: Step-through pattern;Decreased stride length;Wide base of support     General Gait Details: adjusted cane for height  Stairs            Wheelchair Mobility    Modified Rankin (Stroke Patients Only)       Balance Overall balance assessment: Needs assistance   Sitting balance-Leahy Scale:  Good       Standing balance-Leahy Scale: Good                               Pertinent Vitals/Pain Pain Assessment: Faces Faces Pain Scale: Hurts little more Pain Location: neck and L shoulder Pain Descriptors / Indicators: Aching;Sore Pain Intervention(s): Monitored during session;Repositioned    Home Living Family/patient expects to be discharged to:: Private residence Living Arrangements: Children Available Help at Discharge: Family Type of Home: House Home Access: Level entry     Home Layout: Multi-level Home Equipment: Environmental consultant - 2 wheels;Cane - single point;Bedside commode Additional Comments: wants to get toilet riser for toilet    Prior Function Level of Independence: Independent with assistive device(s)         Comments: SPC when going to dialysis occasionally or on steps; drives to dialysis     Hand Dominance        Extremity/Trunk Assessment   Upper Extremity Assessment Upper Extremity Assessment: LUE deficits/detail LUE Deficits / Details: shoulder flexion 3+/5 pain from cervical surgery    Lower Extremity Assessment Lower Extremity Assessment: LLE deficits/detail LLE Deficits / Details: AROM WFL, strength hip flexion 3+/5 mild pain in joint LLE Sensation: (intermittent numbness in feet, more at night)       Communication   Communication: No difficulties  Cognition Arousal/Alertness: Awake/alert Behavior During Therapy: WFL for tasks assessed/performed Overall Cognitive Status: Within Functional Limits for tasks assessed  General Comments      Exercises     Assessment/Plan    PT Assessment Patient needs continued PT services  PT Problem List Decreased mobility;Decreased safety awareness;Cardiopulmonary status limiting activity;Decreased activity tolerance       PT Treatment Interventions DME instruction;Stair training;Therapeutic activities;Balance training;Functional  mobility training;Gait training;Patient/family education    PT Goals (Current goals can be found in the Care Plan section)  Acute Rehab PT Goals Patient Stated Goal: to get home PT Goal Formulation: With patient Time For Goal Achievement: 06/01/19 Potential to Achieve Goals: Good    Frequency Min 3X/week   Barriers to discharge        Co-evaluation               AM-PAC PT "6 Clicks" Mobility  Outcome Measure Help needed turning from your back to your side while in a flat bed without using bedrails?: None Help needed moving from lying on your back to sitting on the side of a flat bed without using bedrails?: None Help needed moving to and from a bed to a chair (including a wheelchair)?: None Help needed standing up from a chair using your arms (e.g., wheelchair or bedside chair)?: None Help needed to walk in hospital room?: A Little Help needed climbing 3-5 steps with a railing? : A Little 6 Click Score: 22    End of Session   Activity Tolerance: Patient tolerated treatment well Patient left: in chair;with call bell/phone within reach   PT Visit Diagnosis: Other abnormalities of gait and mobility (R26.89)    Time: 5809-9833 PT Time Calculation (min) (ACUTE ONLY): 14 min   Charges:   PT Evaluation $PT Eval Low Complexity: [place], PT Acute Rehabilitation Services [phone] 05/18/2019   Reginia Naas 05/18/2019, 4:42 PM

## 2019-05-18 NOTE — Progress Notes (Signed)
PT Cancellation Note  Patient Details Name: Jodi Bell MRN: [id number] DOB: [birth year]   Cancelled Treatment:    Reason Eval/Treat Not Completed: Patient at procedure or test/unavailable; patient currently in HD.  Will attempt later as time permits and pt stable.    Reginia Naas 05/18/2019, 9:16 AM Magda Kiel, [place] [phone] 05/18/2019

## 2019-05-18 NOTE — Consult Note (Signed)
Referring Provider: [facility] Primary Care Physician:  Rudene Anda, MD Primary Gastroenterologist:  Dr. Penelope Coop  Reason for Consultation: GI bleed  HPI: Jodi Bell is a 59 y.o. female with past medical history of psoriatic arthritis, end-stage renal disease on dialysis hypertension, diabetes, degenerative disc disease admitted to the hospital for further evaluation for anemia.  She initially presented to [facility] with weakness and dizziness.  Found to have hemoglobin of 6.2.  Transferred to [place] [facility] for further evaluation.   Patient was admitted to the hospital 04/2019 at [facility].  She had EGD and colonoscopy at that time.  EGD showed gastritis.  Biopsies were negative for H. pylori.  Colonoscopy showed tubular adenoma.  Repeat colonoscopy was recommended in 3 years with 2-day prep. -Full report not available to review.  Patient seen and examined at bedside.  She has been complaining of intermittent black tarry stools since her discharge from [facility].  Also complaining of few streaks of fresh blood in the stool.  Complaining of diarrhea alternating with constipation which is chronic for her.  Taking chronic narcotic pain medication.  Had one episode of nonbloody vomiting few days ago.  Denies any nausea.   EGD in August 2019 by Dr. Penelope Coop for evaluation of anemia showed small adenopathy otherwise normal.  Colonoscopy in August 2019 showed multiple ulcers in the proximal to mid transverse colon.  It was treated with epinephrine injection and clips placement.    Past Medical History:  Diagnosis Date  . Anemia   . Arthritis    oa and psoriatic ra  . Asthma   . Back pain, chronic    lower back  . Candida infection finished tx 2 days ago  . Chronic insomnia   . Chronic kidney disease    stage 3 ckd last ov nephrology 04-29-17 epic  . Chronic kidney disease (CKD) stage G4/A1, severely decreased glomerular filtration rate (GFR) between 15-29  mL/min/1.73 square meter and albuminuria creatinine ratio less than 30 mg/g (HCC)   . COPD (chronic obstructive pulmonary disease) ([place])   . Diabetes mellitus    type 2  . Eczema   . Essential hypertension   . GERD (gastroesophageal reflux disease)   . History of hiatal hernia   . Obesity   . Pneumonia few yrs ago x 2    Past Surgical History:  Procedure Laterality Date  . BACK SURGERY  2016   lower back fusion with cage  . BIOPSY  09/23/2017   Procedure: BIOPSY;  Surgeon: Wonda Horner, MD;  Location: [facility];  Service: Endoscopy;;  . CESAREAN SECTION  1990   x 1   . COLONOSCOPY WITH PROPOFOL N/A 09/23/2017   Procedure: COLONOSCOPY WITH PROPOFOL Hemostatic clips placed;  Surgeon: Wonda Horner, MD;  Location: [facility];  Service: Endoscopy;  Laterality: N/A;  . DILATION AND CURETTAGE OF UTERUS  1988  . ESOPHAGOGASTRODUODENOSCOPY (EGD) WITH PROPOFOL N/A 09/23/2017   Procedure: ESOPHAGOGASTRODUODENOSCOPY (EGD) WITH PROPOFOL;  Surgeon: Wonda Horner, MD;  Location: [facility];  Service: Endoscopy;  Laterality: N/A;  . PARTIAL KNEE ARTHROPLASTY Left 11/12/2017   Procedure: left unicompartmental arthroplasty-medial;  Surgeon: Paralee Cancel, MD;  Location: [facility];  Service: Orthopedics;  Laterality: Left;  48min  . SUBMUCOSAL INJECTION  09/23/2017   Procedure: SUBMUCOSAL INJECTION;  Surgeon: Wonda Horner, MD;  Location: [facility];  Service: Endoscopy;;  in colon  . TUBAL LIGATION  1990    Prior to Admission  medications   Medication Sig Start Date End Date Taking? Authorizing Provider  acetaminophen (TYLENOL) 500 MG tablet Take 1,000 mg by mouth every 6 (six) hours as needed for mild pain.   Yes [provider]  allopurinol (ZYLOPRIM) 100 MG tablet Take 100 mg by mouth every evening.   Yes [provider]  amLODipine (NORVASC) 10 MG tablet Take 10 mg by mouth daily.   Yes [provider]  budesonide-formoterol (SYMBICORT) 160-4.5 MCG/ACT inhaler  Inhale 2 puffs into the lungs 2 (two) times daily.   Yes [provider]  calcium acetate (PHOSLO) 667 MG capsule Take 3 capsules by mouth in the morning and at bedtime. 10/25/18  Yes [provider]  carboxymethylcellulose 1 % ophthalmic solution Place 1 drop into both eyes 3 (three) times daily.   Yes [provider]  cetirizine (ZYRTEC) 10 MG tablet Take 10 mg by mouth 2 (two) times daily as needed (hives).  06/13/17  Yes Shelda Pal, DO  cinacalcet (SENSIPAR) 30 MG tablet Take 30 mg by mouth daily.   Yes [provider]  cloNIDine (CATAPRES) 0.3 MG tablet Take 0.3 mg by mouth 2 (two) times daily.  07/15/15  Yes [provider]  ELDERBERRY PO Take 2 drops by mouth daily.   Yes [provider]  EPINEPHrine (EPIPEN) 0.3 mg/0.3 mL SOAJ injection Inject 0.3 mLs (0.3 mg total) into the muscle once. Patient taking differently: Inject 0.3 mg into the muscle as needed (for allergic reaction).  09/30/12  Yes Le, Thao P, DO  esomeprazole (NEXIUM) 20 MG capsule Take 20 mg by mouth daily.    Yes [provider]  ferrous sulfate 325 (65 FE) MG tablet Take 325 mg by mouth 3 (three) times a week. TUE THUR SAT   Yes [provider]  fluticasone (FLONASE) 50 MCG/ACT nasal spray Place 1 spray into both nostrils daily.    Yes [provider]  folic acid (FOLVITE) 782 MCG tablet Take 400 mcg by mouth daily.    Yes [provider]  furosemide (LASIX) 40 MG tablet Take 40 mg by mouth 2 (two) times daily.    Yes [provider]  gabapentin (NEURONTIN) 300 MG capsule Take 1 capsule (300 mg total) by mouth at bedtime as needed (Neuropathic pain). Patient taking differently: Take 300 mg by mouth daily with supper.  12/14/17 05/18/19 Yes Dana Allan I, MD  hydrALAZINE (APRESOLINE) 50 MG tablet Take 1 tablet (50 mg total) by mouth every 8 (eight) hours. Patient taking differently: Take 100 mg by mouth every 8 (eight)  hours.  12/14/17  Yes Bonnell Public, MD  isosorbide mononitrate (IMDUR) 60 MG 24 hr tablet Take 1 tablet (60 mg total) by mouth daily. 03/02/18  Yes Thurnell Lose, MD  JANUVIA 25 MG tablet Take 25 mg by mouth daily.  08/09/15  Yes [provider]  labetalol (NORMODYNE) 200 MG tablet Take 400 mg by mouth 3 (three) times daily.   Yes [provider]  LORazepam (ATIVAN) 0.5 MG tablet Take 0.5 mg by mouth every 8 (eight) hours as needed for anxiety. 05/02/19  Yes [provider]  methocarbamol (ROBAXIN) 500 MG tablet Take 500 mg by mouth 4 (four) times daily as needed for muscle pain. neck 05/01/19  Yes [provider]  montelukast (SINGULAIR) 10 MG tablet Take 10 mg by mouth daily with supper.    Yes [provider]  multivitamin (RENA-VIT) TABS tablet Take 1 tablet by mouth daily  with supper.   Yes [provider]  olmesartan (BENICAR) 40 MG tablet Take 40 mg by mouth daily. 05/06/19  Yes [provider]  Omega-3 1000 MG CAPS Take 1,000 mg by mouth in the morning, at noon, and at bedtime.   Yes [provider]  ondansetron (ZOFRAN) 4 MG tablet Take 1 tablet (4 mg total) by mouth every 8 (eight) hours as needed for nausea or vomiting. 05/20/18 05/20/19 Yes Aline August, MD  Oxycodone HCl 10 MG TABS Take 1 tablet (10 mg total) by mouth every 6 (six) hours as needed (pain score 7-10). 09/15/18  Yes Black, Lezlie Octave, NP  predniSONE (DELTASONE) 5 MG tablet Take 5 mg by mouth daily with breakfast.    Yes [provider]  Probiotic Product (PROBIOTIC PO) Take 1 capsule by mouth daily.   Yes [provider]  senna (SENOKOT) 8.6 MG tablet Take 1-2 tablets by mouth daily as needed for constipation.   Yes [provider]  Tiotropium Bromide Monohydrate (SPIRIVA RESPIMAT IN) Inhale 2 puffs into the lungs daily.   Yes [provider]  VENTOLIN HFA 108 (90 Base) MCG/ACT inhaler Inhale 1-2 puffs into the lungs  every 6 (six) hours as needed for shortness of breath. wheezing 06/13/15  Yes [provider]  XELJANZ 5 MG TABS Take 1 tablet by mouth at bedtime. 05/17/19  Yes [provider]  zolpidem (AMBIEN) 5 MG tablet Take 5 mg by mouth at bedtime. 05/05/19  Yes [provider]  bisoprolol (ZEBETA) 10 MG tablet Take 2 tablets (20 mg total) by mouth daily. Patient not taking: Reported on 05/18/2019 12/14/17   Dana Allan I, MD  ezetimibe (ZETIA) 10 MG tablet TAKE 1 TABLET BY MOUTH EVERY DAY Patient not taking: Reported on 05/18/2019 03/22/18   Adrian Prows, MD    Scheduled Meds: . sodium chloride   Intravenous Once  . sodium chloride   Intravenous Once  . amLODipine  10 mg Oral QHS  . Chlorhexidine Gluconate Cloth  6 each Topical Q0600  . cloNIDine  0.3 mg Oral BID  . hydrALAZINE  100 mg Oral Q8H  . labetalol  400 mg Oral BID  . senna  1 tablet Oral Daily  . sodium zirconium cyclosilicate  10 g Oral Daily   Continuous Infusions: PRN Meds:.  Allergies as of 05/17/2019 - Review Complete 05/17/2019  Allergen Reaction Noted  . Adalimumab Other (See Comments) 09/04/2014  . Allyl isothiocyanate Swelling 01/21/2018  . Banana Swelling 01/21/2018  . Ezetimibe Other (See Comments) 01/13/2018  . Ibuprofen Other (See Comments) 09/04/2014  . Leflunomide and related Other (See Comments) 09/04/2014  . Lexapro [escitalopram oxalate] Other (See Comments) 09/29/2013  . Lisinopril Swelling 01/05/2014  . Other Other (See Comments) 06/19/2017  . Pistachio nut (diagnostic) Itching 06/19/2017  . Statins Other (See Comments) 09/04/2014  . Trazodone and nefazodone Other (See Comments) 09/04/2014    Family History  Problem Relation Age of Onset  . Lung cancer Mother   . Diabetes Sister   . Heart disease Sister   . Asthma Daughter   . Arthritis Daughter   . Arthritis Daughter   . Thyroid cancer Other        1/2 sister  . Heart disease Other        1/2 sister    Social History    Socioeconomic History  . Marital status: Divorced    Spouse name: BASIL  . Number of children: 3  . Years of education: 1  .  Highest education level: GED or equivalent  Occupational History  . Occupation: retired rn  Tobacco Use  . Smoking status: Former Smoker    Packs/day: 0.50    Years: 40.00    Pack years: 20.00    Types: Cigarettes    Quit date: 05/15/2018    Years since quitting: 1.0  . Smokeless tobacco: Never Used  Substance and Sexual Activity  . Alcohol use: Yes    Comment: occ  . Drug use: No  . Sexual activity: Not Currently    Birth control/protection: Surgical  Other Topics Concern  . Not on file  Social History Narrative  . Not on file   Social Determinants of Health   Financial Resource Strain: Low Risk   . Difficulty of Paying Living Expenses: Not hard at all  Food Insecurity: No Food Insecurity  . Worried About Charity fundraiser in the Last Year: Never true  . Ran Out of Food in the Last Year: Never true  Transportation Needs: No Transportation Needs  . Lack of Transportation (Medical): No  . Lack of Transportation (Non-Medical): No  Physical Activity: Inactive  . Days of Exercise per Week: 0 days  . Minutes of Exercise per Session: 0 min  Stress: No Stress Concern Present  . Feeling of Stress : Not at all  Social Connections: Somewhat Isolated  . Frequency of Communication with Friends and Family: More than three times a week  . Frequency of Social Gatherings with Friends and Family: Three times a week  . Attends Religious Services: More than 4 times per year  . Active Member of Clubs or Organizations: No  . Attends Archivist Meetings: Never  . Marital Status: Divorced  Human resources officer Violence: Not At Risk  . Fear of Current or Ex-Partner: No  . Emotionally Abused: No  . Physically Abused: No  . Sexually Abused: No    Review of Systems: Review of Systems  Constitutional: Positive for malaise/fatigue. Negative for chills and  fever.  HENT: Negative for hearing loss and tinnitus.   Eyes: Negative for blurred vision and double vision.  Respiratory: Positive for shortness of breath. Negative for cough and hemoptysis.   Cardiovascular: Negative for chest pain and palpitations.  Gastrointestinal: Positive for blood in stool, constipation, diarrhea, melena, nausea and vomiting.  Genitourinary: Negative for dysuria and urgency.  Musculoskeletal: Positive for joint pain, myalgias and neck pain.  Skin: Negative for itching and rash.  Neurological: Positive for weakness. Negative for seizures and loss of consciousness.  Endo/Heme/Allergies: Bruises/bleeds easily.  Psychiatric/Behavioral: The patient is not nervous/anxious and does not have insomnia.     Physical Exam: Vital signs: Vitals:   05/18/19 1115 05/18/19 1133  BP: (!) 176/84 (!) 184/81  Pulse: 72 77  Resp: 17 15  Temp: 98.7 F (37.1 C) 98.2 F (36.8 C)  SpO2: 100% 98%   Last BM Date: 05/17/19(reports normally takes senekot) Physical Exam  Constitutional: She is oriented to person, place, and time. She appears well-developed and well-nourished. No distress.  HENT:  Head: Normocephalic and atraumatic.  Cervical collar present  Eyes: EOM are normal. No scleral icterus.  Cardiovascular: Normal rate, regular rhythm and normal heart sounds.  Pulmonary/Chest: Effort normal. No respiratory distress.  Abdominal: Soft. Bowel sounds are normal. She exhibits no distension. There is no abdominal tenderness. There is no rebound and no guarding.  Musculoskeletal:        General: No edema. Normal range of motion.     Cervical back:  Normal range of motion and neck supple.  Neurological: She is alert and oriented to person, place, and time.  Skin: Skin is warm. No erythema.  Psychiatric: She has a normal mood and affect. Judgment and thought content normal.  Vitals reviewed.  GI:  Lab Results: Recent Labs    05/17/19 1510 05/17/19 2356 05/18/19 0845  WBC  5.5 5.4 5.4  HGB 6.2* 6.0* 6.9*  HCT 20.4* 19.1* 22.0*  PLT 212 218 215   BMET Recent Labs    05/17/19 1510 05/17/19 2356 05/18/19 0844  NA 134* 138 137  K 5.5* 5.0 4.2  CL 94* 97* 95*  CO2 25 27 25   GLUCOSE 144* 138* 158*  BUN 46* 49* 54*  CREATININE 7.42* 8.20* 8.76*  CALCIUM 8.5* 8.9 8.7*   LFT Recent Labs    05/18/19 0844  ALBUMIN 3.2*   PT/INR No results for input(s): LABPROT, INR in the last 72 hours.   Studies/Results: No results found.  Impression/Plan: -GI bleed with black tarry stool and streaks of fresh blood in the stool.  Had EGD and colonoscopy in March 2021 at [facility].  EGD showed gastritis and duodenitis.  Colonoscopy showed tubular adenoma.  Full report not available to review. -Acute blood loss anemia.  Improved with blood transfusion -End-stage renal disease.  On dialysis  Recommendations ------------------------ -Start IV twice daily PPI.  -Plan for EGD tomorrow.  Keep n.p.o. past midnight -  if EGD negative, recommend capsule endoscopy.  Patient with no history of small bowel surgery.  No history of abdominal or pelvic radiation.  Risk of capsule retention discussed.  Patient verbalized understanding.  Risks (bleeding, infection, bowel perforation that could require surgery, sedation-related changes in cardiopulmonary systems), benefits (identification and possible treatment of source of symptoms, exclusion of certain causes of symptoms), and alternatives (watchful waiting, radiographic imaging studies, empiric medical treatment)  were explained to patient in detail and patient wishes to proceed.    LOS: 1 day   Otis Brace  MD, FACP 05/18/2019, 1:33 PM  Contact #  [phone]

## 2019-05-18 NOTE — H&P (Signed)
History and Physical    Jodi Bell YYT:[id number] DOB: 1960/12/04 DOA: 05/17/2019  PCP: Rudene Anda, MD   Patient coming from: Home  I have personally briefly reviewed patient's old medical records in [place]  Chief Complaint: Transferred from [place] generalized weakness and fatigue  HPI: Jodi Bell is a 59 y.o. female with medical history significant of anemia of chronic disease, end-stage renal disease on dialysis, type 2 diabetes mellitus, hypertension, GERD, asthma presents with generalized weakness and fatigue the last few days.  She also reports drinking wine since last Friday and her stools have been bloody and dark since then.  She reports being admitted to [facility] for symptomatic anemia on 04/12/2019, underwent an EGD showing esophagitis and duodenitis, 1 unit of PRBC transfusion , and was on PPI.  But patient left the hospital AMA on 04/13/2019.  She presents yesterday with the above complaints and was found to have a hemoglobin around 6.  She was admitted to [facility] for further evaluation. Her hemoglobin was 6.2, ED Course: On arrival to ED hemoglobin was 6.2, potassium of 5.5 and guaiac positive.  Review of Systems: As per HPI otherwise"All others reviewed and are negative,"   Past Medical History:  Diagnosis Date  . Anemia   . Arthritis    oa and psoriatic ra  . Asthma   . Back pain, chronic    lower back  . Candida infection finished tx 2 days ago  . Chronic insomnia   . Chronic kidney disease    stage 3 ckd last ov nephrology 04-29-17 epic  . Chronic kidney disease (CKD) stage G4/A1, severely decreased glomerular filtration rate (GFR) between 15-29 mL/min/1.73 square meter and albuminuria creatinine ratio less than 30 mg/g (HCC)   . COPD (chronic obstructive pulmonary disease) ([place])   . Diabetes mellitus    type 2  . Eczema   . Essential hypertension   . GERD (gastroesophageal reflux disease)   . History of hiatal hernia    . Obesity   . Pneumonia few yrs ago x 2    Past Surgical History:  Procedure Laterality Date  . BACK SURGERY  2016   lower back fusion with cage  . BIOPSY  09/23/2017   Procedure: BIOPSY;  Surgeon: Wonda Horner, MD;  Location: [facility];  Service: Endoscopy;;  . CESAREAN SECTION  1990   x 1   . COLONOSCOPY WITH PROPOFOL N/A 09/23/2017   Procedure: COLONOSCOPY WITH PROPOFOL Hemostatic clips placed;  Surgeon: Wonda Horner, MD;  Location: [facility];  Service: Endoscopy;  Laterality: N/A;  . DILATION AND CURETTAGE OF UTERUS  1988  . ESOPHAGOGASTRODUODENOSCOPY (EGD) WITH PROPOFOL N/A 09/23/2017   Procedure: ESOPHAGOGASTRODUODENOSCOPY (EGD) WITH PROPOFOL;  Surgeon: Wonda Horner, MD;  Location: [facility];  Service: Endoscopy;  Laterality: N/A;  . PARTIAL KNEE ARTHROPLASTY Left 11/12/2017   Procedure: left unicompartmental arthroplasty-medial;  Surgeon: Paralee Cancel, MD;  Location: [facility] OR[facility];  Service: Orthopedics;  Laterality: Left;  86min  . SUBMUCOSAL INJECTION  09/23/2017   Procedure: SUBMUCOSAL INJECTION;  Surgeon: Wonda Horner, MD;  Location: [facility];  Service: Endoscopy;;  in colon  . TUBAL LIGATION  1990   Social History:    reports that she quit smoking about a year ago. Her smoking use included cigarettes. She has a 20.00 pack-year smoking history. She has never used smokeless tobacco. She reports current alcohol use. She reports that she does not use drugs.  Allergies  Allergen Reactions  . Banana Anaphylaxis and Swelling    TONGUE AND MOUTH SWELLING  . Black Walnut Flavor Anaphylaxis    Walnuts  . Hazelnut ([place]) Allergy Skin Test Anaphylaxis    Hazelnuts  . No Healthtouch Food Allergies Anaphylaxis    Spicy Mustard 4.7.2021 Pt reports that she eats Regular Yellow Mustard  . Pecan Nut (Diagnostic) Anaphylaxis    Pecans  . Adalimumab Other (See Comments)    Blisters on abdomen =humira  . Ezetimibe Other (See Comments)    myalgias  . Ibuprofen Other  (See Comments)    Renal Problems  . Leflunomide And Related Other (See Comments)    Severe Headache  . Lexapro [Escitalopram Oxalate] Other (See Comments)    Hand problems - Serotonin Syndrome   . Lisinopril Swelling    Angioedema   . Secukinumab     Cosentyx = angiodema  . Statins Other (See Comments)    Leg pains and weakness  . Trazodone And Nefazodone Other (See Comments)    Serotonin Syndrome     Family History  Problem Relation Age of Onset  . Lung cancer Mother   . Diabetes Sister   . Heart disease Sister   . Asthma Daughter   . Arthritis Daughter   . Arthritis Daughter   . Thyroid cancer Other        1/2 sister  . Heart disease Other        1/2 sister   Family history reviewed and not pertinent  Prior to Admission medications   Medication Sig Start Date End Date Taking? Authorizing Provider  acetaminophen (TYLENOL) 500 MG tablet Take 1,000 mg by mouth every 6 (six) hours as needed for mild pain.   Yes [provider]  allopurinol (ZYLOPRIM) 100 MG tablet Take 100 mg by mouth every evening.   Yes [provider]  amLODipine (NORVASC) 10 MG tablet Take 10 mg by mouth daily.   Yes [provider]  budesonide-formoterol (SYMBICORT) 160-4.5 MCG/ACT inhaler Inhale 2 puffs into the lungs 2 (two) times daily.   Yes [provider]  calcium acetate (PHOSLO) 667 MG capsule Take 3 capsules by mouth in the morning and at bedtime. 10/25/18  Yes [provider]  carboxymethylcellulose 1 % ophthalmic solution Place 1 drop into both eyes 3 (three) times daily.   Yes [provider]  cetirizine (ZYRTEC) 10 MG tablet Take 10 mg by mouth 2 (two) times daily as needed (hives).  06/13/17  Yes Shelda Pal, DO  cinacalcet (SENSIPAR) 30 MG tablet Take 30 mg by mouth daily.   Yes [provider]  cloNIDine (CATAPRES) 0.3 MG tablet Take 0.3 mg by mouth 2 (two) times daily.  07/15/15  Yes [provider]  ELDERBERRY  PO Take 2 drops by mouth daily.   Yes [provider]  EPINEPHrine (EPIPEN) 0.3 mg/0.3 mL SOAJ injection Inject 0.3 mLs (0.3 mg total) into the muscle once. Patient taking differently: Inject 0.3 mg into the muscle as needed (for allergic reaction).  09/30/12  Yes Le, Thao P, DO  esomeprazole (NEXIUM) 20 MG capsule Take 20 mg by mouth daily.    Yes [provider]  ferrous sulfate 325 (65 FE) MG tablet Take 325 mg by mouth 3 (three) times a week. TUE THUR SAT   Yes [provider]  fluticasone (FLONASE) 50 MCG/ACT nasal spray Place 1 spray into both nostrils daily.    Yes [provider]  folic acid (FOLVITE) 209 MCG  tablet Take 400 mcg by mouth daily.    Yes [provider]  furosemide (LASIX) 40 MG tablet Take 40 mg by mouth 2 (two) times daily.    Yes [provider]  gabapentin (NEURONTIN) 300 MG capsule Take 1 capsule (300 mg total) by mouth at bedtime as needed (Neuropathic pain). Patient taking differently: Take 300 mg by mouth daily with supper.  12/14/17 05/18/19 Yes Dana Allan I, MD  hydrALAZINE (APRESOLINE) 50 MG tablet Take 1 tablet (50 mg total) by mouth every 8 (eight) hours. Patient taking differently: Take 100 mg by mouth every 8 (eight) hours.  12/14/17  Yes Bonnell Public, MD  isosorbide mononitrate (IMDUR) 60 MG 24 hr tablet Take 1 tablet (60 mg total) by mouth daily. 03/02/18  Yes Thurnell Lose, MD  JANUVIA 25 MG tablet Take 25 mg by mouth daily.  08/09/15  Yes [provider]  labetalol (NORMODYNE) 200 MG tablet Take 400 mg by mouth 3 (three) times daily.   Yes [provider]  LORazepam (ATIVAN) 0.5 MG tablet Take 0.5 mg by mouth every 8 (eight) hours as needed for anxiety. 05/02/19  Yes [provider]  methocarbamol (ROBAXIN) 500 MG tablet Take 500 mg by mouth 4 (four) times daily as needed for muscle pain. neck 05/01/19  Yes [provider]  montelukast (SINGULAIR) 10 MG tablet  Take 10 mg by mouth daily with supper.    Yes [provider]  multivitamin (RENA-VIT) TABS tablet Take 1 tablet by mouth daily with supper.   Yes [provider]  olmesartan (BENICAR) 40 MG tablet Take 40 mg by mouth daily. 05/06/19  Yes [provider]  Omega-3 1000 MG CAPS Take 1,000 mg by mouth in the morning, at noon, and at bedtime.   Yes [provider]  ondansetron (ZOFRAN) 4 MG tablet Take 1 tablet (4 mg total) by mouth every 8 (eight) hours as needed for nausea or vomiting. 05/20/18 05/20/19 Yes Aline August, MD  Oxycodone HCl 10 MG TABS Take 1 tablet (10 mg total) by mouth every 6 (six) hours as needed (pain score 7-10). 09/15/18  Yes Black, Lezlie Octave, NP  predniSONE (DELTASONE) 5 MG tablet Take 5 mg by mouth daily with breakfast.    Yes [provider]  Probiotic Product (PROBIOTIC PO) Take 1 capsule by mouth daily.   Yes [provider]  senna (SENOKOT) 8.6 MG tablet Take 1-2 tablets by mouth daily as needed for constipation.   Yes [provider]  Tiotropium Bromide Monohydrate (SPIRIVA RESPIMAT IN) Inhale 2 puffs into the lungs daily.   Yes [provider]  VENTOLIN HFA 108 (90 Base) MCG/ACT inhaler Inhale 1-2 puffs into the lungs every 6 (six) hours as needed for shortness of breath. wheezing 06/13/15  Yes [provider]  XELJANZ 5 MG TABS Take 1 tablet by mouth at bedtime. 05/17/19  Yes [provider]  zolpidem (AMBIEN) 5 MG tablet Take 5 mg by mouth at bedtime. 05/05/19  Yes [provider]  bisoprolol (ZEBETA) 10 MG tablet Take 2 tablets (20 mg total) by mouth daily. Patient not taking: Reported on 05/18/2019 12/14/17   Dana Allan I, MD  ezetimibe (ZETIA) 10 MG tablet TAKE 1 TABLET BY MOUTH EVERY DAY Patient not taking: Reported on 05/18/2019 03/22/18   Adrian Prows, MD    Physical Exam: Vitals:   05/18/19 1615 05/18/19 1630 05/18/19 1632 05/18/19 1700  BP: (!) 196/85 (!) 208/88 (!) 207/92  (!) 210/89  Pulse: 72 73 75 72  Resp: 13 12 15 12   Temp: 98.4 F (36.9 C) 98.4 F (36.9 C)    TempSrc: Oral Oral    SpO2: 98% 99% 100% 99%  Weight:      Height:        Constitutional: NAD, calm, comfortable Vitals:   05/18/19 1615 05/18/19 1630 05/18/19 1632 05/18/19 1700  BP: (!) 196/85 (!) 208/88 (!) 207/92 (!) 210/89  Pulse: 72 73 75 72  Resp: 13 12 15 12   Temp: 98.4 F (36.9 C) 98.4 F (36.9 C)    TempSrc: Oral Oral    SpO2: 98% 99% 100% 99%  Weight:      Height:       Eyes: PERRL, lids and conjunctivae normal ENMT: Mucous membranes are moist..Normal dentition.  Neck: normal, supple, no masses, no thyromegaly Respiratory: clear to auscultation bilaterally, Normal respiratory effort. No accessory muscle use.  Cardiovascular: Regular rate and rhythm, no JVD Abdomen: no tenderness, no distention bowel sounds positive.  Musculoskeletal: no clubbing / cyanosis. No joint deformity upper and lower extremities. Good ROM, no contractures. Normal muscle tone.  Skin: no rashes, lesions, ulcers. No induration Neurologic: CN 2-12 grossly intact. Sensation intact, DTR normal. Strength 5/5 in all 4.  Psychiatric: . Alert and oriented x 3. Normal mood.    Labs on Admission: I have personally reviewed following labs and imaging studies  CBC: Recent Labs  Lab 05/17/19 1510 05/17/19 2356 05/18/19 0845  WBC 5.5 5.4 5.4  NEUTROABS 4.6  --   --   HGB 6.2* 6.0* 6.9*  HCT 20.4* 19.1* 22.0*  MCV 106.3* 106.7* 100.5*  PLT 212 218 409   Basic Metabolic Panel: Recent Labs  Lab 05/17/19 1510 05/17/19 2356 05/18/19 0844  NA 134* 138 137  K 5.5* 5.0 4.2  CL 94* 97* 95*  CO2 25 27 25   GLUCOSE 144* 138* 158*  BUN 46* 49* 54*  CREATININE 7.42* 8.20* 8.76*  CALCIUM 8.5* 8.9 8.7*  PHOS  --  6.2* 7.2*   GFR: Estimated Creatinine Clearance: 6.5 mL/min (A) (by C-G formula based on SCr of 8.76 mg/dL (H)). Liver Function Tests: Recent Labs  Lab 05/17/19 2356 05/18/19 0844   ALBUMIN 3.2* 3.2*   No results for input(s): LIPASE, AMYLASE in the last 168 hours. No results for input(s): AMMONIA in the last 168 hours. Coagulation Profile: No results for input(s): INR, PROTIME in the last 168 hours. Cardiac Enzymes: No results for input(s): CKTOTAL, CKMB, CKMBINDEX, TROPONINI in the last 168 hours. BNP (last 3 results) No results for input(s): PROBNP in the last 8760 hours. HbA1C: No results for input(s): HGBA1C in the last 72 hours. CBG: Recent Labs  Lab 05/18/19 0023  GLUCAP 121*   Lipid Profile: No results for input(s): CHOL, HDL, LDLCALC, TRIG, CHOLHDL, LDLDIRECT in the last 72 hours. Thyroid Function Tests: No results for input(s): TSH, T4TOTAL, FREET4, T3FREE, THYROIDAB in the last 72 hours. Anemia Panel: No results for input(s): VITAMINB12, FOLATE, FERRITIN, TIBC, IRON, RETICCTPCT in the last 72 hours. Urine analysis:    Component Value Date/Time   COLORURINE YELLOW 08/17/2015 1621   APPEARANCEUR CLEAR 08/17/2015 1621   LABSPEC 1.015 08/17/2015 1621   PHURINE 5.5 08/17/2015 1621   GLUCOSEU NEGATIVE 08/17/2015 1621   HGBUR NEGATIVE 08/17/2015 1621   BILIRUBINUR NEGATIVE 08/17/2015 1621   KETONESUR NEGATIVE 08/17/2015 1621   PROTEINUR 100 (A) 08/17/2015 1621   UROBILINOGEN 0.2 09/05/2014 0013   NITRITE NEGATIVE 08/17/2015 1621   LEUKOCYTESUR  NEGATIVE 08/17/2015 1621    Radiological Exams on Admission: No results found.    Assessment/Plan Active Problems:   Type 2 diabetes mellitus (HCC)   Essential hypertension   Psoriasis arthropathica (HCC)   GERD (gastroesophageal reflux disease)   COPD (chronic obstructive pulmonary disease) (HCC)   Symptomatic anemia   Asthma   ESRD (end stage renal disease) (HCC)   OSA (obstructive sleep apnea)   GI bleeding   Acute anemia of blood loss probably from upper GI bleed Hemoglobin around 6.2 on admission, with melanotic stools and bright red blood per rectum N.p.o. after midnight for  possible EGD in the morning.  GI on board and appreciate recommendations. Continue with PPI IV twice daily Check CBC every 6 hours Transfuse to keep hemoglobin greater than 7.  Currently hemoglobin is at 6.91 unit of PRBC transfusion ordered.    End-stage renal disease on dialysis  Nephrology consulted appreciate recommendations.    Essential hypertension/hypertensive urgency Restarted all home medications and recheck blood pressure and notify MD. Patient is currently on clonidine 0.3 mg twice daily, amlodipine 10 mg daily, isosorbide for 60 mg daily, Normodyne 400 mg 3 times daily,.    Hyperkalemia Resolved with Lokelma.    Type 2 diabetes mellitus with peripheral neuropathy Continue with sliding scale insulin and get hemoglobin A1c.   Psoriatic arthritis; On prednisone 5 mg daily.   COPD Wheezing heard Continue with Spiriva and Ventolin as needed.   DVT prophylaxis: SCDs Code Status: Full code Family Communication: None at bedside Disposition Plan: Pending further evaluation GI bleed Consults called: GI consult, nephrology Admission status: Inpatient/progress   Hosie Poisson MD [facility]    05/18/2019, 6:01 PM

## 2019-05-18 NOTE — Progress Notes (Signed)
CRITICAL VALUE ALERT  Critical Value:  Hemoglobin 6.0  Date & Time Notified:  4/7 @ 0030  Provider Notified: Baltazar Najjar @ [phone]  Orders Received/Actions taken: Patient awaiting to receive 1 unit of PRBC. Will transfuse once blood is ready.

## 2019-05-18 NOTE — Plan of Care (Signed)

## 2019-05-18 NOTE — Progress Notes (Signed)
[facility] Progress Note  Subjective: seen on HD , no c/o's.    Vitals:   05/18/19 0930 05/18/19 1000 05/18/19 1030 05/18/19 1050  BP: (!) 176/84 (!) 186/69 (!) 191/88 (!) 189/87  Pulse: 84 82 78 77  Resp:    14  Temp:    98.4 F (36.9 C)  TempSrc:    Oral  SpO2:    99%  Weight:      Height:        Exam:  alert, obese, no distress   No jvd   Chest occ rales at bases o/w clear   Cor reg no RG   Abd soft ntnd obese     Ext no edema    LUE AVF +bruit    Dialysis: [facility] MWF  3.5h  2/2.5 bath  73kg  Hep none  L AVF  400/700  Assessment/ Plan: 1. ABLA/ GI bleed- agree with transfusion of 1 unit tonight and recheck in am. Have ordered 1 additional unit prbc w/ HD this am. O/w per pmd.  2.  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 HD this am, is on now.  3. Hyperkalemia - mild, on HD now 4.  Hypertension/volume  - resume home bp meds and follow, no vol excess on exam.  5.  Anemia ckd  - as above, transfuse and follow 6.  Metabolic bone disease -  Cont with home meds 7.  Nutrition - renal diet, carb modified, npo after midnight for likely EGD tomorrow. 8. Chronic pain- ok to resume meds     Rob Arno Cullers 05/18/2019, 11:06 AM   Recent Labs  Lab 05/17/19 2356 05/18/19 0844 05/18/19 0845  K 5.0 4.2  --   BUN 49* 54*  --   CREATININE 8.20* 8.76*  --   CALCIUM 8.9 8.7*  --   PHOS 6.2* 7.2*  --   HGB 6.0*  --  6.9*   Inpatient medications: . sodium chloride   Intravenous Once  . amLODipine  10 mg Oral QHS  . Chlorhexidine Gluconate Cloth  6 each Topical Q0600  . cloNIDine  0.3 mg Oral BID  . hydrALAZINE  100 mg Oral Q8H  . labetalol  400 mg Oral BID  . senna  1 tablet Oral Daily  . sodium zirconium cyclosilicate  10 g Oral Daily   . sodium chloride    . sodium chloride     sodium chloride, sodium chloride, lidocaine (PF), lidocaine-prilocaine, pentafluoroprop-tetrafluoroeth

## 2019-05-18 NOTE — H&P (View-Only) (Signed)
Referring Provider: [facility] Primary Care Physician:  Rudene Anda, MD Primary Gastroenterologist:  Dr. Penelope Coop  Reason for Consultation: GI bleed  HPI: Jodi Bell is a 59 y.o. female with past medical history of psoriatic arthritis, end-stage renal disease on dialysis hypertension, diabetes, degenerative disc disease admitted to the hospital for further evaluation for anemia.  She initially presented to [facility] with weakness and dizziness.  Found to have hemoglobin of 6.2.  Transferred to [facility] for further evaluation.   Patient was admitted to the hospital 04/2019 at [facility].  She had EGD and colonoscopy at that time.  EGD showed gastritis.  Biopsies were negative for H. pylori.  Colonoscopy showed tubular adenoma.  Repeat colonoscopy was recommended in 3 years with 2-day prep. -Full report not available to review.  Patient seen and examined at bedside.  She has been complaining of intermittent black tarry stools since her discharge from [facility].  Also complaining of few streaks of fresh blood in the stool.  Complaining of diarrhea alternating with constipation which is chronic for her.  Taking chronic narcotic pain medication.  Had one episode of nonbloody vomiting few days ago.  Denies any nausea.   EGD in August 2019 by Dr. Penelope Coop for evaluation of anemia showed small adenopathy otherwise normal.  Colonoscopy in August 2019 showed multiple ulcers in the proximal to mid transverse colon.  It was treated with epinephrine injection and clips placement.    Past Medical History:  Diagnosis Date  . Anemia   . Arthritis    oa and psoriatic ra  . Asthma   . Back pain, chronic    lower back  . Candida infection finished tx 2 days ago  . Chronic insomnia   . Chronic kidney disease    stage 3 ckd last ov nephrology 04-29-17 epic  . Chronic kidney disease (CKD) stage G4/A1, severely decreased glomerular filtration rate (GFR) between 15-29  mL/min/1.73 square meter and albuminuria creatinine ratio less than 30 mg/g (HCC)   . COPD (chronic obstructive pulmonary disease) ([place])   . Diabetes mellitus    type 2  . Eczema   . Essential hypertension   . GERD (gastroesophageal reflux disease)   . History of hiatal hernia   . Obesity   . Pneumonia few yrs ago x 2    Past Surgical History:  Procedure Laterality Date  . BACK SURGERY  2016   lower back fusion with cage  . BIOPSY  09/23/2017   Procedure: BIOPSY;  Surgeon: Wonda Horner, MD;  Location: [facility] ENDOSCOPY;  Service: Endoscopy;;  . CESAREAN SECTION  1990   x 1   . COLONOSCOPY WITH PROPOFOL N/A 09/23/2017   Procedure: COLONOSCOPY WITH PROPOFOL Hemostatic clips placed;  Surgeon: Wonda Horner, MD;  Location: [facility] ENDOSCOPY;  Service: Endoscopy;  Laterality: N/A;  . DILATION AND CURETTAGE OF UTERUS  1988  . ESOPHAGOGASTRODUODENOSCOPY (EGD) WITH PROPOFOL N/A 09/23/2017   Procedure: ESOPHAGOGASTRODUODENOSCOPY (EGD) WITH PROPOFOL;  Surgeon: Wonda Horner, MD;  Location: [facility] ENDOSCOPY;  Service: Endoscopy;  Laterality: N/A;  . PARTIAL KNEE ARTHROPLASTY Left 11/12/2017   Procedure: left unicompartmental arthroplasty-medial;  Surgeon: Paralee Cancel, MD;  Location: [facility] ORS;  Service: Orthopedics;  Laterality: Left;  53min  . SUBMUCOSAL INJECTION  09/23/2017   Procedure: SUBMUCOSAL INJECTION;  Surgeon: Wonda Horner, MD;  Location: [facility] ENDOSCOPY;  Service: Endoscopy;;  in colon  . TUBAL LIGATION  1990    Prior to Admission  medications   Medication Sig Start Date End Date Taking? Authorizing Provider  acetaminophen (TYLENOL) 500 MG tablet Take 1,000 mg by mouth every 6 (six) hours as needed for mild pain.   Yes [provider]  allopurinol (ZYLOPRIM) 100 MG tablet Take 100 mg by mouth every evening.   Yes [provider]  amLODipine (NORVASC) 10 MG tablet Take 10 mg by mouth daily.   Yes [provider]  budesonide-formoterol (SYMBICORT) 160-4.5 MCG/ACT inhaler  Inhale 2 puffs into the lungs 2 (two) times daily.   Yes [provider]  calcium acetate (PHOSLO) 667 MG capsule Take 3 capsules by mouth in the morning and at bedtime. 10/25/18  Yes [provider]  carboxymethylcellulose 1 % ophthalmic solution Place 1 drop into both eyes 3 (three) times daily.   Yes [provider]  cetirizine (ZYRTEC) 10 MG tablet Take 10 mg by mouth 2 (two) times daily as needed (hives).  06/13/17  Yes Shelda Pal, DO  cinacalcet (SENSIPAR) 30 MG tablet Take 30 mg by mouth daily.   Yes [provider]  cloNIDine (CATAPRES) 0.3 MG tablet Take 0.3 mg by mouth 2 (two) times daily.  07/15/15  Yes [provider]  ELDERBERRY PO Take 2 drops by mouth daily.   Yes [provider]  EPINEPHrine (EPIPEN) 0.3 mg/0.3 mL SOAJ injection Inject 0.3 mLs (0.3 mg total) into the muscle once. Patient taking differently: Inject 0.3 mg into the muscle as needed (for allergic reaction).  09/30/12  Yes Le, Thao P, DO  esomeprazole (NEXIUM) 20 MG capsule Take 20 mg by mouth daily.    Yes [provider]  ferrous sulfate 325 (65 FE) MG tablet Take 325 mg by mouth 3 (three) times a week. TUE THUR SAT   Yes [provider]  fluticasone (FLONASE) 50 MCG/ACT nasal spray Place 1 spray into both nostrils daily.    Yes [provider]  folic acid (FOLVITE) 786 MCG tablet Take 400 mcg by mouth daily.    Yes [provider]  furosemide (LASIX) 40 MG tablet Take 40 mg by mouth 2 (two) times daily.    Yes [provider]  gabapentin (NEURONTIN) 300 MG capsule Take 1 capsule (300 mg total) by mouth at bedtime as needed (Neuropathic pain). Patient taking differently: Take 300 mg by mouth daily with supper.  12/14/17 05/18/19 Yes Dana Allan I, MD  hydrALAZINE (APRESOLINE) 50 MG tablet Take 1 tablet (50 mg total) by mouth every 8 (eight) hours. Patient taking differently: Take 100 mg by mouth every 8 (eight)  hours.  12/14/17  Yes Bonnell Public, MD  isosorbide mononitrate (IMDUR) 60 MG 24 hr tablet Take 1 tablet (60 mg total) by mouth daily. 03/02/18  Yes Thurnell Lose, MD  JANUVIA 25 MG tablet Take 25 mg by mouth daily.  08/09/15  Yes [provider]  labetalol (NORMODYNE) 200 MG tablet Take 400 mg by mouth 3 (three) times daily.   Yes [provider]  LORazepam (ATIVAN) 0.5 MG tablet Take 0.5 mg by mouth every 8 (eight) hours as needed for anxiety. 05/02/19  Yes [provider]  methocarbamol (ROBAXIN) 500 MG tablet Take 500 mg by mouth 4 (four) times daily as needed for muscle pain. neck 05/01/19  Yes [provider]  montelukast (SINGULAIR) 10 MG tablet Take 10 mg by mouth daily with supper.    Yes [provider]  multivitamin (RENA-VIT) TABS tablet Take 1 tablet by mouth daily  with supper.   Yes [provider]  olmesartan (BENICAR) 40 MG tablet Take 40 mg by mouth daily. 05/06/19  Yes [provider]  Omega-3 1000 MG CAPS Take 1,000 mg by mouth in the morning, at noon, and at bedtime.   Yes [provider]  ondansetron (ZOFRAN) 4 MG tablet Take 1 tablet (4 mg total) by mouth every 8 (eight) hours as needed for nausea or vomiting. 05/20/18 05/20/19 Yes Aline August, MD  Oxycodone HCl 10 MG TABS Take 1 tablet (10 mg total) by mouth every 6 (six) hours as needed (pain score 7-10). 09/15/18  Yes Black, Lezlie Octave, NP  predniSONE (DELTASONE) 5 MG tablet Take 5 mg by mouth daily with breakfast.    Yes [provider]  Probiotic Product (PROBIOTIC PO) Take 1 capsule by mouth daily.   Yes [provider]  senna (SENOKOT) 8.6 MG tablet Take 1-2 tablets by mouth daily as needed for constipation.   Yes [provider]  Tiotropium Bromide Monohydrate (SPIRIVA RESPIMAT IN) Inhale 2 puffs into the lungs daily.   Yes [provider]  VENTOLIN HFA 108 (90 Base) MCG/ACT inhaler Inhale 1-2 puffs into the lungs  every 6 (six) hours as needed for shortness of breath. wheezing 06/13/15  Yes [provider]  XELJANZ 5 MG TABS Take 1 tablet by mouth at bedtime. 05/17/19  Yes [provider]  zolpidem (AMBIEN) 5 MG tablet Take 5 mg by mouth at bedtime. 05/05/19  Yes [provider]  bisoprolol (ZEBETA) 10 MG tablet Take 2 tablets (20 mg total) by mouth daily. Patient not taking: Reported on 05/18/2019 12/14/17   Dana Allan I, MD  ezetimibe (ZETIA) 10 MG tablet TAKE 1 TABLET BY MOUTH EVERY DAY Patient not taking: Reported on 05/18/2019 03/22/18   Adrian Prows, MD    Scheduled Meds: . sodium chloride   Intravenous Once  . sodium chloride   Intravenous Once  . amLODipine  10 mg Oral QHS  . Chlorhexidine Gluconate Cloth  6 each Topical Q0600  . cloNIDine  0.3 mg Oral BID  . hydrALAZINE  100 mg Oral Q8H  . labetalol  400 mg Oral BID  . senna  1 tablet Oral Daily  . sodium zirconium cyclosilicate  10 g Oral Daily   Continuous Infusions: PRN Meds:.  Allergies as of 05/17/2019 - Review Complete 05/17/2019  Allergen Reaction Noted  . Adalimumab Other (See Comments) 09/04/2014  . Allyl isothiocyanate Swelling 01/21/2018  . Banana Swelling 01/21/2018  . Ezetimibe Other (See Comments) 01/13/2018  . Ibuprofen Other (See Comments) 09/04/2014  . Leflunomide and related Other (See Comments) 09/04/2014  . Lexapro [escitalopram oxalate] Other (See Comments) 09/29/2013  . Lisinopril Swelling 01/05/2014  . Other Other (See Comments) 06/19/2017  . Pistachio nut (diagnostic) Itching 06/19/2017  . Statins Other (See Comments) 09/04/2014  . Trazodone and nefazodone Other (See Comments) 09/04/2014    Family History  Problem Relation Age of Onset  . Lung cancer Mother   . Diabetes Sister   . Heart disease Sister   . Asthma Daughter   . Arthritis Daughter   . Arthritis Daughter   . Thyroid cancer Other        1/2 sister  . Heart disease Other        1/2 sister    Social History    Socioeconomic History  . Marital status: Divorced    Spouse name: BASIL  . Number of children: 3  . Years of education: 1  .  Highest education level: GED or equivalent  Occupational History  . Occupation: retired rn  Tobacco Use  . Smoking status: Former Smoker    Packs/day: 0.50    Years: 40.00    Pack years: 20.00    Types: Cigarettes    Quit date: 05/15/2018    Years since quitting: 1.0  . Smokeless tobacco: Never Used  Substance and Sexual Activity  . Alcohol use: Yes    Comment: occ  . Drug use: No  . Sexual activity: Not Currently    Birth control/protection: Surgical  Other Topics Concern  . Not on file  Social History Narrative  . Not on file   Social Determinants of Health   Financial Resource Strain: Low Risk   . Difficulty of Paying Living Expenses: Not hard at all  Food Insecurity: No Food Insecurity  . Worried About Charity fundraiser in the Last Year: Never true  . Ran Out of Food in the Last Year: Never true  Transportation Needs: No Transportation Needs  . Lack of Transportation (Medical): No  . Lack of Transportation (Non-Medical): No  Physical Activity: Inactive  . Days of Exercise per Week: 0 days  . Minutes of Exercise per Session: 0 min  Stress: No Stress Concern Present  . Feeling of Stress : Not at all  Social Connections: Somewhat Isolated  . Frequency of Communication with Friends and Family: More than three times a week  . Frequency of Social Gatherings with Friends and Family: Three times a week  . Attends Religious Services: More than 4 times per year  . Active Member of Clubs or Organizations: No  . Attends Archivist Meetings: Never  . Marital Status: Divorced  Human resources officer Violence: Not At Risk  . Fear of Current or Ex-Partner: No  . Emotionally Abused: No  . Physically Abused: No  . Sexually Abused: No    Review of Systems: Review of Systems  Constitutional: Positive for malaise/fatigue. Negative for chills and  fever.  HENT: Negative for hearing loss and tinnitus.   Eyes: Negative for blurred vision and double vision.  Respiratory: Positive for shortness of breath. Negative for cough and hemoptysis.   Cardiovascular: Negative for chest pain and palpitations.  Gastrointestinal: Positive for blood in stool, constipation, diarrhea, melena, nausea and vomiting.  Genitourinary: Negative for dysuria and urgency.  Musculoskeletal: Positive for joint pain, myalgias and neck pain.  Skin: Negative for itching and rash.  Neurological: Positive for weakness. Negative for seizures and loss of consciousness.  Endo/Heme/Allergies: Bruises/bleeds easily.  Psychiatric/Behavioral: The patient is not nervous/anxious and does not have insomnia.     Physical Exam: Vital signs: Vitals:   05/18/19 1115 05/18/19 1133  BP: (!) 176/84 (!) 184/81  Pulse: 72 77  Resp: 17 15  Temp: 98.7 F (37.1 C) 98.2 F (36.8 C)  SpO2: 100% 98%   Last BM Date: 05/17/19(reports normally takes senekot) Physical Exam  Constitutional: She is oriented to person, place, and time. She appears well-developed and well-nourished. No distress.  HENT:  Head: Normocephalic and atraumatic.  Cervical collar present  Eyes: EOM are normal. No scleral icterus.  Cardiovascular: Normal rate, regular rhythm and normal heart sounds.  Pulmonary/Chest: Effort normal. No respiratory distress.  Abdominal: Soft. Bowel sounds are normal. She exhibits no distension. There is no abdominal tenderness. There is no rebound and no guarding.  Musculoskeletal:        General: No edema. Normal range of motion.     Cervical back:  Normal range of motion and neck supple.  Neurological: She is alert and oriented to person, place, and time.  Skin: Skin is warm. No erythema.  Psychiatric: She has a normal mood and affect. Judgment and thought content normal.  Vitals reviewed.  GI:  Lab Results: Recent Labs    05/17/19 1510 05/17/19 2356 05/18/19 0845  WBC  5.5 5.4 5.4  HGB 6.2* 6.0* 6.9*  HCT 20.4* 19.1* 22.0*  PLT 212 218 215   BMET Recent Labs    05/17/19 1510 05/17/19 2356 05/18/19 0844  NA 134* 138 137  K 5.5* 5.0 4.2  CL 94* 97* 95*  CO2 25 27 25   GLUCOSE 144* 138* 158*  BUN 46* 49* 54*  CREATININE 7.42* 8.20* 8.76*  CALCIUM 8.5* 8.9 8.7*   LFT Recent Labs    05/18/19 0844  ALBUMIN 3.2*   PT/INR No results for input(s): LABPROT, INR in the last 72 hours.   Studies/Results: No results found.  Impression/Plan: -GI bleed with black tarry stool and streaks of fresh blood in the stool.  Had EGD and colonoscopy in March 2021 at [facility].  EGD showed gastritis and duodenitis.  Colonoscopy showed tubular adenoma.  Full report not available to review. -Acute blood loss anemia.  Improved with blood transfusion -End-stage renal disease.  On dialysis  Recommendations ------------------------ -Start IV twice daily PPI.  -Plan for EGD tomorrow.  Keep n.p.o. past midnight -  if EGD negative, recommend capsule endoscopy.  Patient with no history of small bowel surgery.  No history of abdominal or pelvic radiation.  Risk of capsule retention discussed.  Patient verbalized understanding.  Risks (bleeding, infection, bowel perforation that could require surgery, sedation-related changes in cardiopulmonary systems), benefits (identification and possible treatment of source of symptoms, exclusion of certain causes of symptoms), and alternatives (watchful waiting, radiographic imaging studies, empiric medical treatment)  were explained to patient in detail and patient wishes to proceed.    LOS: 1 day   Otis Brace  MD, FACP 05/18/2019, 1:33 PM  Contact #  [phone]

## 2019-05-18 NOTE — Progress Notes (Signed)
Patient via bed to dialysis

## 2019-05-19 ENCOUNTER — Inpatient Hospital Stay (HOSPITAL_COMMUNITY): Payer: Medicare PPO | Admitting: Certified Registered"

## 2019-05-19 ENCOUNTER — Encounter (HOSPITAL_COMMUNITY): Payer: Self-pay | Admitting: Internal Medicine

## 2019-05-19 ENCOUNTER — Other Ambulatory Visit: Payer: Self-pay

## 2019-05-19 ENCOUNTER — Encounter (HOSPITAL_COMMUNITY)
Admission: EM | Disposition: A | Discharge status: Home Health | Payer: Self-pay | Source: Home / Self Care | Attending: Internal Medicine

## 2019-05-19 DIAGNOSIS — Z992 Dependence on renal dialysis: Secondary | ICD-10-CM

## 2019-05-19 DIAGNOSIS — J45909 Unspecified asthma, uncomplicated: Secondary | ICD-10-CM

## 2019-05-19 HISTORY — PX: BIOPSY: SHX5522

## 2019-05-19 HISTORY — PX: GIVENS CAPSULE STUDY: SHX5432

## 2019-05-19 HISTORY — PX: HOT HEMOSTASIS: SHX5433

## 2019-05-19 HISTORY — PX: ESOPHAGOGASTRODUODENOSCOPY (EGD) WITH PROPOFOL: SHX5813

## 2019-05-19 LAB — GLUCOSE, CAPILLARY
Glucose-Capillary: 99 mg/dL (ref 70–99)
Glucose-Capillary: 149 mg/dL — ABNORMAL HIGH (ref 70–99)

## 2019-05-19 LAB — CBC
HCT: 29.2 % — ABNORMAL LOW (ref 36.0–46.0)
Hemoglobin: 9.5 g/dL — ABNORMAL LOW (ref 12.0–15.0)
MCH: 31.4 pg (ref 26.0–34.0)
MCHC: 32.5 g/dL (ref 30.0–36.0)
MCV: 96.4 fL (ref 80.0–100.0)
Platelets: 191 10*3/uL (ref 150–400)
RBC: 3.03 MIL/uL — ABNORMAL LOW (ref 3.87–5.11)
RDW: 18.7 % — ABNORMAL HIGH (ref 11.5–15.5)
WBC: 5.3 10*3/uL (ref 4.0–10.5)
nRBC: 0 % (ref 0.0–0.2)

## 2019-05-19 LAB — TYPE AND SCREEN
ABO/RH(D): AB POS
Antibody Screen: NEGATIVE
Unit division: 0
Unit division: 0
Unit division: 0

## 2019-05-19 LAB — BPAM RBC
Blood Product Expiration Date: 202104102359
Blood Product Expiration Date: 202105012359
Blood Product Expiration Date: 202105082359
ISSUE DATE / TIME: 202104070413
ISSUE DATE / TIME: 202104071056
ISSUE DATE / TIME: 202104071536
Unit Type and Rh: 8400
Unit Type and Rh: 8400
Unit Type and Rh: 8400

## 2019-05-19 SURGERY — ESOPHAGOGASTRODUODENOSCOPY (EGD) WITH PROPOFOL
Anesthesia: Monitor Anesthesia Care

## 2019-05-19 MED ORDER — SODIUM CHLORIDE 0.9 % IV SOLN
INTRAVENOUS | Status: AC | PRN
  Administered 2019-05-19: 500 mL via INTRAVENOUS

## 2019-05-19 MED ORDER — PROPOFOL 500 MG/50ML IV EMUL
INTRAVENOUS | Status: DC | PRN
  Administered 2019-05-19: 125 ug/kg/min via INTRAVENOUS

## 2019-05-19 MED ORDER — PROPOFOL 10 MG/ML IV BOLUS
INTRAVENOUS | Status: DC | PRN
  Administered 2019-05-19: 40 mg via INTRAVENOUS
  Administered 2019-05-19 (×2): 20 mg via INTRAVENOUS
  Administered 2019-05-19: 30 mg via INTRAVENOUS

## 2019-05-19 MED ORDER — CHLORHEXIDINE GLUCONATE CLOTH 2 % EX PADS: 6.0000 | MEDICATED_PAD | Freq: Every day | CUTANEOUS | Status: DC

## 2019-05-19 MED ORDER — LABETALOL HCL 5 MG/ML IV SOLN
10.0000 mg | INTRAVENOUS | Status: DC | PRN
  Administered 2019-05-19: 10 mg via INTRAVENOUS
  Filled 2019-05-19: qty 4

## 2019-05-19 SURGICAL SUPPLY — 15 items

## 2019-05-19 NOTE — Transfer of Care (Signed)
Immediate Anesthesia Transfer of Care Note  Patient: Jodi Bell  Procedure(s) Performed: ESOPHAGOGASTRODUODENOSCOPY (EGD) WITH PROPOFOL with possible Capsule (N/A ) GIVENS CAPSULE STUDY (N/A ) BIOPSY HOT HEMOSTASIS (ARGON PLASMA COAGULATION/BICAP) (N/A )  Patient Location: Endoscopy Unit  Anesthesia Type:MAC  Level of Consciousness: drowsy and patient cooperative  Airway & Oxygen Therapy: Patient Spontanous Breathing  Post-op Assessment: Report given to RN and Post -op Vital signs reviewed and stable  Post vital signs: Reviewed and stable  Last Vitals:  Vitals Value Taken Time  BP 142/66 05/19/19 1151  Temp    Pulse 69 05/19/19 1151  Resp 10 05/19/19 1151  SpO2 96 % 05/19/19 1151  Vitals shown include unvalidated device data.  Last Pain:  Vitals:   05/19/19 1003  TempSrc: Oral  PainSc: 0-No pain         Complications: No apparent anesthesia complications

## 2019-05-19 NOTE — Anesthesia Preprocedure Evaluation (Addendum)
Anesthesia Evaluation  Patient identified by MRN, date of birth, ID band Patient awake    Reviewed: Allergy & Precautions, NPO status , Patient's Chart, lab work & pertinent test results, reviewed documented beta blocker date and time   Airway Mallampati: II  TM Distance: >3 FB Neck ROM: Limited   Comment: C-collar in place Dental  (+) Teeth Intact, Dental Advisory Given   Pulmonary asthma , sleep apnea and Continuous Positive Airway Pressure Ventilation , COPD,  COPD inhaler, Current Smoker and Patient abstained from smoking.,    Pulmonary exam normal breath sounds clear to auscultation       Cardiovascular hypertension, Pt. on medications and Pt. on home beta blockers  Rhythm:Regular Rate:Tachycardia + Systolic murmurs    Neuro/Psych  Headaches, negative psych ROS   GI/Hepatic Neg liver ROS, hiatal hernia, GERD  ,GI Bleed    Endo/Other  diabetes, Type 2, Oral Hypoglycemic AgentsObesity   Renal/GU ESRF and DialysisRenal disease     Musculoskeletal  (+) Arthritis ,   Abdominal   Peds  Hematology  (+) Blood dyscrasia, anemia ,   Anesthesia Other Findings Day of surgery medications reviewed with the patient.  Reproductive/Obstetrics                            Anesthesia Physical Anesthesia Plan  ASA: III  Anesthesia Plan: MAC   Post-op Pain Management:    Induction: Intravenous  PONV Risk Score and Plan: 2 and Propofol infusion and Treatment may vary due to age or medical condition  Airway Management Planned: Nasal Cannula and Natural Airway  Additional Equipment:   Intra-op Plan:   Post-operative Plan:   Informed Consent: I have reviewed the patients History and Physical, chart, labs and discussed the procedure including the risks, benefits and alternatives for the proposed anesthesia with the patient or authorized representative who has indicated his/her understanding and  acceptance.     Dental advisory given  Plan Discussed with: CRNA and Anesthesiologist  Anesthesia Plan Comments:         Anesthesia Quick Evaluation

## 2019-05-19 NOTE — Progress Notes (Signed)
PROGRESS NOTE    Jodi Bell  YKD:[id number] DOB: 1960-12-05 DOA: 05/17/2019 PCP: Rudene Anda, MD    Brief Narrative:   Jodi Bell is a 59 y.o. female with medical history significant of anemia of chronic disease, end-stage renal disease on dialysis, type 2 diabetes mellitus, hypertension, GERD, asthma presents with generalized weakness and fatigue the last few days.  She also reports drinking wine since last Friday and her stools have been bloody and dark since then.  She reports being admitted to [facility] hospital for symptomatic anemia on 04/12/2019, underwent an EGD showing esophagitis and duodenitis, 1 unit of PRBC transfusion , and was on PPI.  But patient left the hospital AMA on 04/13/2019.  She presents on 4/6 with the above complaints and was found to have a hemoglobin around 6.  She was admitted to [facility] for further evaluation.  Assessment & Plan:   Active Problems:   Type 2 diabetes mellitus (HCC)   Essential hypertension   Psoriasis arthropathica (HCC)   GERD (gastroesophageal reflux disease)   COPD (chronic obstructive pulmonary disease) (HCC)   Symptomatic anemia   Asthma   ESRD (end stage renal disease) (HCC)   OSA (obstructive sleep apnea)   GI bleeding   Acute anemia of blood loss probably from the upper GI bleed Patient's hemoglobin around 6.2 on admission and reports melanotic stools and also bright red blood per rectum Gastroenterology consulted and patient underwent EGD showing reflux esophagitis and esophageal varices.  Video capsule inserted. Continue with PPI and transfuse to keep hemoglobin greater than 7. Patient underwent 1 unit of PRBC transfusion and repeat hemoglobin around 9.   End-stage renal disease on dialysis Further management as per nephrology.   Hypertensive urgency Blood pressure parameters improved when compared to yesterday and restarted all her home medications reviewed. Patient is currently on clonidine 0.3 mg 3 times  daily, amlodipine 10 mg daily, labetalol 400 mg 3 times daily, isosorbide 60 mg daily and Benicar 40 mg daily.    Type 2 diabetes mellitus with peripheral neuropathy. CBG (last 3)  Recent Labs    05/18/19 0023 05/19/19 1241  GLUCAP 121* 99   Continue with sliding scale insulin and get hemoglobin A1c.    COPD and asthma No wheezing heard Continue with Spiriva and Ventolin.   Psoriatec arthritis Continue with prednisone.  DVT prophylaxis: SCDs Code Status: Full code Family Communication: None at bedside  disposition Plan:  . Patient came from: Home.            . Anticipated d/c place: Home . Barriers to d/c OR conditions which need to be met to effect a safe d/c: pending further work up for GI bleeding with video capsule.    Consultants:   Gastroenterology  Nephrology.   Procedures: EGD shows LA Grade B reflux esophagitis with no bleeding. - Esophageal ulcer with no stigmata of recent bleeding. - Esophageal plaques were found, suspicious for candidiasis. Biopsied. - Normal mucosa was found in the entire stomach.  - A single angioectasia in the duodenum.   Video capsule placement.   Antimicrobials: none.   Subjective: Pt reports on bowel movement yesterday with some blood.  No chest pain or sob.   Objective: Vitals:   05/19/19 1156 05/19/19 1200 05/19/19 1210 05/19/19 1227  BP: (!) 142/66 (!) 158/69 (!) 179/65 (!) 162/78  Pulse: 67 66 69 70  Resp: _0 Temp: 98.8 F (37.1 C)     TempSrc: Oral  SpO2: 96% 97% 98% 100%  Weight:      Height:        Intake/Output Summary (Last 24 hours) at 05/19/2019 1247 Last data filed at 05/19/2019 1148 Gross per 24 hour  Intake 701 ml  Output --  Net 701 ml   Filed Weights   05/17/19 1304 05/17/19 2000 05/18/19 0830  Weight: 71.2 kg 72.6 kg 74.8 kg    Examination:  General exam: Appears calm and comfortable  Respiratory system: Clear to auscultation. Respiratory effort normal. Cardiovascular  system: S1 & S2 heard, RRR. No JVD,No pedal edema. Gastrointestinal system: Abdomen is nondistended, soft and nontender. Normal bowel sounds heard. Central nervous system: Alert and oriented. No focal neurological deficits. Extremities: Symmetric 5 x 5 power. Skin: No rashes, lesions or ulcers Psychiatry: Mood & affect appropriate.     Data Reviewed: I have personally reviewed following labs and imaging studies  CBC: Recent Labs  Lab 05/17/19 1510 05/17/19 2356 05/18/19 0845 05/18/19 2058 05/19/19 0639  WBC 5.5 5.4 5.4  --  5.3  NEUTROABS 4.6  --   --   --   --   HGB 6.2* 6.0* 6.9* 9.9* 9.5*  HCT 20.4* 19.1* 22.0* 29.7* 29.2*  MCV 106.3* 106.7* 100.5*  --  96.4  PLT 212 218 215  --  397   Basic Metabolic Panel: Recent Labs  Lab 05/17/19 1510 05/17/19 2356 05/18/19 0844  NA 134* 138 137  K 5.5* 5.0 4.2  CL 94* 97* 95*  CO2 _0 GLUCOSE 144* 138* 158*  BUN 46* 49* 54*  CREATININE 7.42* 8.20* 8.76*  CALCIUM 8.5* 8.9 8.7*  PHOS  --  6.2* 7.2*   GFR: Estimated Creatinine Clearance: 6.5 mL/min (A) (by C-G formula based on SCr of 8.76 mg/dL (H)). Liver Function Tests: Recent Labs  Lab 05/17/19 2356 05/18/19 0844  ALBUMIN 3.2* 3.2*   No results for input(s): LIPASE, AMYLASE in the last 168 hours. No results for input(s): AMMONIA in the last 168 hours. Coagulation Profile: No results for input(s): INR, PROTIME in the last 168 hours. Cardiac Enzymes: No results for input(s): CKTOTAL, CKMB, CKMBINDEX, TROPONINI in the last 168 hours. BNP (last 3 results) No results for input(s): PROBNP in the last 8760 hours. HbA1C: No results for input(s): HGBA1C in the last 72 hours. CBG: Recent Labs  Lab 05/18/19 0023 05/19/19 1241  GLUCAP 121* 99   Lipid Profile: No results for input(s): CHOL, HDL, LDLCALC, TRIG, CHOLHDL, LDLDIRECT in the last 72 hours. Thyroid Function Tests: No results for input(s): TSH, T4TOTAL, FREET4, T3FREE, THYROIDAB in the last 72  hours. Anemia Panel: No results for input(s): VITAMINB12, FOLATE, FERRITIN, TIBC, IRON, RETICCTPCT in the last 72 hours. Sepsis Labs: No results for input(s): PROCALCITON, LATICACIDVEN in the last 168 hours.  Recent Results (from the past 240 hour(s))  Respiratory Panel by RT PCR (Flu A&B, Covid) - Nasopharyngeal Swab     Status: None   Collection Time: 05/17/19  5:14 PM   Specimen: Nasopharyngeal Swab  Result Value Ref Range Status   SARS Coronavirus 2 by RT PCR NEGATIVE NEGATIVE Final    Comment: (NOTE) SARS-CoV-2 target nucleic acids are NOT DETECTED. The SARS-CoV-2 RNA is generally detectable in upper respiratoy specimens during the acute phase of infection. The lowest concentration of SARS-CoV-2 viral copies this assay can detect is 131 copies/mL. A negative result does not preclude SARS-Cov-2 infection and should not be used as the sole basis for treatment or other patient management  decisions. A negative result may occur with  improper specimen collection/handling, submission of specimen other than nasopharyngeal swab, presence of viral mutation(s) within the areas targeted by this assay, and inadequate number of viral copies (<131 copies/mL). A negative result must be combined with clinical observations, patient history, and epidemiological information. The expected result is Negative. Fact Sheet for Patients:  PinkCheek.be Fact Sheet for Healthcare Providers:  GravelBags.it This test is not yet ap proved or cleared by the [place] FDA and  has been authorized for detection and/or diagnosis of SARS-CoV-2 by FDA under an Emergency Use Authorization (EUA). This EUA will remain  in effect (meaning this test can be used) for the duration of the COVID-19 declaration under Section 564(b)(1) of the Act, 21 U.S.C. section 360bbb-3(b)(1), unless the authorization is terminated or revoked sooner.    Influenza A by PCR  NEGATIVE NEGATIVE Final   Influenza B by PCR NEGATIVE NEGATIVE Final    Comment: (NOTE) The Xpert Xpress SARS-CoV-2/FLU/RSV assay is intended as an aid in  the diagnosis of influenza from Nasopharyngeal swab specimens and  should not be used as a sole basis for treatment. Nasal washings and  aspirates are unacceptable for Xpert Xpress SARS-CoV-2/FLU/RSV  testing. Fact Sheet for Patients: PinkCheek.be Fact Sheet for Healthcare Providers: GravelBags.it This test is not yet approved or cleared by the [place] FDA and  has been authorized for detection and/or diagnosis of SARS-CoV-2 by  FDA under an Emergency Use Authorization (EUA). This EUA will remain  in effect (meaning this test can be used) for the duration of the  Covid-19 declaration under Section 564(b)(1) of the Act, 21  U.S.C. section 360bbb-3(b)(1), unless the authorization is  terminated or revoked. Performed at [facility], [address] [place], [place] [address]   MRSA PCR Screening     Status: None   Collection Time: 05/17/19  9:00 PM   Specimen: Nasal Mucosa; Nasopharyngeal  Result Value Ref Range Status   MRSA by PCR NEGATIVE NEGATIVE Final    Comment:        The GeneXpert MRSA Assay (FDA approved for NASAL specimens only), is one component of a comprehensive MRSA colonization surveillance program. It is not intended to diagnose MRSA infection nor to guide or monitor treatment for MRSA infections. Performed at [facility], [place] [address], [place], [place] [address]          Radiology Studies: No results found.      Scheduled Meds: . sodium chloride   Intravenous Once  . sodium chloride   Intravenous Once  . allopurinol  100 mg Oral QPM  . amLODipine  10 mg Oral QHS  . calcium acetate  2,001 mg Oral TID WC  . Chlorhexidine Gluconate Cloth  6 each Topical Q0600  . cinacalcet  30 mg Oral Q breakfast  . cloNIDine   0.3 mg Oral TID  . ferrous sulfate  325 mg Oral Q T,Th,Sat-1800  . fluticasone  1 spray Each Nare Daily  . folic acid  1 mg Oral Daily  . furosemide  40 mg Oral BID  . gabapentin  300 mg Oral Q supper  . hydrALAZINE  100 mg Oral Q8H  . insulin aspart  0-5 Units Subcutaneous QHS  . insulin aspart  0-9 Units Subcutaneous TID WC  . irbesartan  150 mg Oral Daily  . isosorbide mononitrate  60 mg Oral Daily  . labetalol  400 mg Oral TID  . mometasone-formoterol  2  puff Inhalation BID  . montelukast  10 mg Oral Q supper  . pantoprazole (PROTONIX) IV  40 mg Intravenous Q12H  . predniSONE  5 mg Oral Q breakfast  . senna  1 tablet Oral Daily  . sodium zirconium cyclosilicate  10 g Oral Daily  . umeclidinium bromide  1 puff Inhalation Daily   Continuous Infusions:   LOS: 2 days      Hosie Poisson, MD [facility]   To contact the attending provider between 7A-7P or the covering provider during after hours 7P-7A, please log into the web site [email or web address] and access using universal [place] password for that web site. If you do not have the password, please call the hospital operator.  05/19/2019, 12:47 PM

## 2019-05-19 NOTE — Progress Notes (Signed)
PT Cancellation Note  Patient Details Name: Jodi Bell MRN: [id number] DOB: [birth year]   Cancelled Treatment:    Reason Eval/Treat Not Completed: Medical issues which prohibited therapy; hooked up to camera for capsule endoscopy with lines, etc.  Will attempt to see tomorrow around HD.   Reginia Naas 05/19/2019, 4:31 PM  Magda Kiel, [place] [phone] 05/19/2019

## 2019-05-19 NOTE — Progress Notes (Signed)
[facility] Progress Note  Subjective: seen on HD , no c/o's.    Vitals:   05/19/19 1200 05/19/19 1210 05/19/19 1227 05/19/19 1406  BP: (!) 158/69 (!) 179/65 (!) 162/78 (!) 179/76  Pulse: 66 69 70   Resp: 10 14 20    Temp:      TempSrc:      SpO2: 97% 98% 100%   Weight:      Height:        Exam:  alert, obese, no distress   No jvd   Chest occ rales at bases o/w clear   Cor reg no RG   Abd soft ntnd obese     Ext no edema    LUE AVF +bruit    Dialysis: [facility] MWF  3.5h  2/2.5 bath  73kg  Hep none  L AVF  400/700  Assessment/ Plan: 1. ABLA/ GI bleed- EGD today showed esophagitis w/ varices. Video capsule inserted. On PPI , prn prbc's.  2.  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 HD Friday, no heparin.  3. Hyperkalemia - mild, on HD now 4.  Hypertension/volume  - resume home bp meds and follow, up 2kg , BP's up UF 2.5 L next hd  5.  Anemia ckd  - get records 6.  Metabolic bone disease -  Cont with home meds 7.  Nutrition - renal diet, carb modified, npo after midnight for likely EGD tomorrow. 8. Chronic pain- ok to resume meds     Rob Berk Pilot 05/19/2019, 2:43 PM   Recent Labs  Lab 05/17/19 2356 05/18/19 0844 05/18/19 0845 05/18/19 2058 05/19/19 0639  K 5.0 4.2  --   --   --   BUN 49* 54*  --   --   --   CREATININE 8.20* 8.76*  --   --   --   CALCIUM 8.9 8.7*  --   --   --   PHOS 6.2* 7.2*  --   --   --   HGB 6.0*  --    < > 9.9* 9.5*   < > = values in this interval not displayed.   Inpatient medications: . sodium chloride   Intravenous Once  . sodium chloride   Intravenous Once  . allopurinol  100 mg Oral QPM  . amLODipine  10 mg Oral QHS  . calcium acetate  2,001 mg Oral TID WC  . Chlorhexidine Gluconate Cloth  6 each Topical Q0600  . cinacalcet  30 mg Oral Q breakfast  . cloNIDine  0.3 mg Oral TID  . ferrous sulfate  325 mg Oral Q T,Th,Sat-1800  . fluticasone  1 spray Each Nare Daily  . folic acid  1 mg Oral Daily  . furosemide  40 mg  Oral BID  . gabapentin  300 mg Oral Q supper  . hydrALAZINE  100 mg Oral Q8H  . insulin aspart  0-5 Units Subcutaneous QHS  . insulin aspart  0-9 Units Subcutaneous TID WC  . irbesartan  150 mg Oral Daily  . isosorbide mononitrate  60 mg Oral Daily  . labetalol  400 mg Oral TID  . mometasone-formoterol  2 puff Inhalation BID  . montelukast  10 mg Oral Q supper  . pantoprazole (PROTONIX) IV  40 mg Intravenous Q12H  . predniSONE  5 mg Oral Q breakfast  . senna  1 tablet Oral Daily  . sodium zirconium cyclosilicate  10 g Oral Daily  . umeclidinium bromide  1 puff Inhalation  Daily    acetaminophen, albuterol, labetalol, LORazepam, methocarbamol, oxyCODONE, zolpidem

## 2019-05-19 NOTE — Op Note (Signed)
[facility] Patient Name: Jodi Bell Procedure Date : 05/19/2019 MRN: [id number] Attending MD: Otis Brace , MD Date of Birth: 03/22/1960 CSN: [id number] Age: 59 Admit Type: Inpatient Procedure:                Upper GI endoscopy Indications:              Melena, Suspected upper gastrointestinal bleeding Providers:                Otis Brace, MD, Jobe Igo, RN, Elspeth Cho Tech., Technician, Lance Coon, CRNA Referring MD:              Medicines:                Sedation Administered by an Anesthesia Professional Complications:            No immediate complications. Estimated Blood Loss:     Estimated blood loss was minimal. Procedure:                Pre-Anesthesia Assessment:                           - Prior to the procedure, a History and Physical                            was performed, and patient medications and                            allergies were reviewed. The patient's tolerance of                            previous anesthesia was also reviewed. The risks                            and benefits of the procedure and the sedation                            options and risks were discussed with the patient.                            All questions were answered, and informed consent                            was obtained. Prior Anticoagulants: The patient has                            taken no previous anticoagulant or antiplatelet                            agents. ASA Grade Assessment: III - A patient with                            severe systemic disease. After reviewing the risks  and benefits, the patient was deemed in                            satisfactory condition to undergo the procedure.                           After obtaining informed consent, the endoscope was                            passed under direct vision. Throughout the                            procedure,  the patient's blood pressure, pulse, and                            oxygen saturations were monitored continuously. The                            [id number]-H190 ([id number]) Olympus gastroscope was                            introduced through the mouth, and advanced to the                            second part of duodenum. The upper GI endoscopy was                            accomplished without difficulty. The patient                            tolerated the procedure well. Scope In: Scope Out: Findings:      LA Grade B (one or more mucosal breaks greater than 5 mm, not extending       between the tops of two mucosal folds) esophagitis with no bleeding was       found in the distal esophagus.      One superficial esophageal ulcer with no stigmata of recent bleeding was       found in the middle third of the esophagus. The lesion was 4 mm in       largest dimension.      Patchy, white plaques were found in the middle third of the esophagus       and in the lower third of the esophagus. Biopsies were taken with a cold       forceps for histology.      Normal mucosa was found in the entire examined stomach.      The cardia and gastric fundus were normal on retroflexion.      A single small angioectasia with bleeding on contact was found in the       duodenal bulb. Fulguration to ablate the lesion to prevent bleeding by       argon plasma was successful.      The second portion of the duodenum was normal.      Using the endoscope, the video capsule enteroscope was advanced into the       duodenal bulb. Impression:               -  LA Grade B reflux esophagitis with no bleeding.                           - Esophageal ulcer with no stigmata of recent                            bleeding.                           - Esophageal plaques were found, suspicious for                            candidiasis. Biopsied.                           - Normal mucosa was found in the entire stomach.                            - A single angioectasia in the duodenum. Treated                            with argon plasma coagulation (APC).                           - Normal second portion of the duodenum.                           - Successful completion of the Video Capsule                            Enteroscope placement. Recommendation:           - Resume previous diet.                           - Continue present medications.                           - Await pathology results. Procedure Code(s):        --- Professional ---                           40347, 50, Esophagogastroduodenoscopy, flexible,                            transoral; with control of bleeding, any method                           43239, Esophagogastroduodenoscopy, flexible,                            transoral; with biopsy, single or multiple Diagnosis Code(s):        --- Professional ---                           K21.00, Gastro-esophageal reflux disease with  esophagitis, without bleeding                           K22.10, Ulcer of esophagus without bleeding                           K22.9, Disease of esophagus, unspecified                           K31.819, Angiodysplasia of stomach and duodenum                            without bleeding                           K92.1, Melena (includes Hematochezia) CPT copyright 2019 American Medical Association. All rights reserved. The codes documented in this report are preliminary and upon coder review may  be revised to meet current compliance requirements. Otis Brace, MD Otis Brace, MD 05/19/2019 11:59:59 AM Number of Addenda: 0

## 2019-05-19 NOTE — Progress Notes (Signed)
RT offered pt CPAP for the night and pt declined at this time. RT will continue to monitor.

## 2019-05-19 NOTE — Plan of Care (Signed)
  Problem: Education: Goal: Knowledge of General Education information will improve Description: Including pain rating scale, medication(s)/side effects and non-pharmacologic comfort measures Outcome: Progressing   Problem: Clinical Measurements: Goal: Ability to maintain clinical measurements within normal limits will improve Outcome: Progressing Goal: Will remain free from infection Outcome: Progressing Goal: Respiratory complications will improve Outcome: Progressing   Problem: Activity: Goal: Risk for activity intolerance will decrease Outcome: Progressing   Problem: Coping: Goal: Level of anxiety will decrease Outcome: Progressing

## 2019-05-19 NOTE — Interval H&P Note (Signed)
History and Physical Interval Note:  05/19/2019 10:32 AM  Jodi Bell  has presented today for surgery, with the diagnosis of GI bleed.  The various methods of treatment have been discussed with the patient and family. After consideration of risks, benefits and other options for treatment, the patient has consented to  Procedure(s): ESOPHAGOGASTRODUODENOSCOPY (EGD) WITH PROPOFOL with possible Capsule (N/A) as a surgical intervention.  The patient's history has been reviewed, patient examined, no change in status, stable for surgery.  I have reviewed the patient's chart and labs.  Questions were answered to the patient's satisfaction.    Hemoglobin improved with blood transfusion.  Continues to have bloody bowel movements.  Proceed with EGD plus or minus capsule endoscopy.  Risks (bleeding, infection, bowel perforation that could require surgery, sedation-related changes in cardiopulmonary systems, possible risk of capsule retention), benefits (identification and possible treatment of source of symptoms, exclusion of certain causes of symptoms), and alternatives (watchful waiting, radiographic imaging studies, empiric medical treatment)  were explained to patient in detail and patient wishes to proceed.    Fantasha Daniele

## 2019-05-19 NOTE — Anesthesia Postprocedure Evaluation (Signed)
Anesthesia Post Note  Patient: GEORGINA KRIST  Procedure(s) Performed: ESOPHAGOGASTRODUODENOSCOPY (EGD) WITH PROPOFOL with possible Capsule (N/A ) GIVENS CAPSULE STUDY (N/A ) BIOPSY HOT HEMOSTASIS (ARGON PLASMA COAGULATION/BICAP) (N/A )     Patient location during evaluation: PACU Anesthesia Type: MAC Level of consciousness: awake and alert and oriented Pain management: pain level controlled Vital Signs Assessment: post-procedure vital signs reviewed and stable Respiratory status: spontaneous breathing, nonlabored ventilation and respiratory function stable Cardiovascular status: blood pressure returned to baseline Postop Assessment: no apparent nausea or vomiting Anesthetic complications: no    Last Vitals:  Vitals:   05/19/19 1200 05/19/19 1210  BP: (!) 158/69 (!) 179/65  Pulse: 66 69  Resp: 10 14  Temp:    SpO2: 97% 98%    Last Pain:  Vitals:   05/19/19 1210  TempSrc:   PainSc: 0-No pain                 Brennan Bailey

## 2019-05-20 ENCOUNTER — Other Ambulatory Visit: Payer: Self-pay | Admitting: Physician Assistant

## 2019-05-20 LAB — CBC
HCT: 32.1 % — ABNORMAL LOW (ref 36.0–46.0)
Hemoglobin: 10.4 g/dL — ABNORMAL LOW (ref 12.0–15.0)
MCH: 31.2 pg (ref 26.0–34.0)
MCHC: 32.4 g/dL (ref 30.0–36.0)
MCV: 96.4 fL (ref 80.0–100.0)
Platelets: 201 10*3/uL (ref 150–400)
RBC: 3.33 MIL/uL — ABNORMAL LOW (ref 3.87–5.11)
RDW: 17.7 % — ABNORMAL HIGH (ref 11.5–15.5)
WBC: 6.7 10*3/uL (ref 4.0–10.5)
nRBC: 0 % (ref 0.0–0.2)

## 2019-05-20 LAB — COMPREHENSIVE METABOLIC PANEL
ALT: 10 U/L (ref 0–44)
AST: 15 U/L (ref 15–41)
Albumin: 3.4 g/dL — ABNORMAL LOW (ref 3.5–5.0)
Alkaline Phosphatase: 53 U/L (ref 38–126)
Anion gap: 16 — ABNORMAL HIGH (ref 5–15)
BUN: 42 mg/dL — ABNORMAL HIGH (ref 6–20)
CO2: 23 mmol/L (ref 22–32)
Calcium: 9.6 mg/dL (ref 8.9–10.3)
Chloride: 97 mmol/L — ABNORMAL LOW (ref 98–111)
Creatinine, Ser: 9.19 mg/dL — ABNORMAL HIGH (ref 0.44–1.00)
GFR calc Af Amer: 5 mL/min — ABNORMAL LOW (ref >60)
GFR calc non Af Amer: 4 mL/min — ABNORMAL LOW (ref >60)
Glucose, Bld: 136 mg/dL — ABNORMAL HIGH (ref 70–99)
Potassium: 5.3 mmol/L — ABNORMAL HIGH (ref 3.5–5.1)
Sodium: 136 mmol/L (ref 135–145)
Total Bilirubin: 1 mg/dL (ref 0.3–1.2)
Total Protein: 6 g/dL — ABNORMAL LOW (ref 6.5–8.1)

## 2019-05-20 LAB — HEMOGLOBIN A1C
Hgb A1c MFr Bld: 4.9 % (ref 4.8–5.6)
Mean Plasma Glucose: 94 mg/dL

## 2019-05-20 LAB — GLUCOSE, CAPILLARY
Glucose-Capillary: 154 mg/dL — ABNORMAL HIGH (ref 70–99)
Glucose-Capillary: 111 mg/dL — ABNORMAL HIGH (ref 70–99)

## 2019-05-20 NOTE — Progress Notes (Signed)
Physical Therapy Treatment Patient Details Name: Jodi Bell MRN: [id number] DOB: October 23, 1960 Today's Date: 05/20/2019    History of Present Illness Jodi Bell is a 59 y.o. female with a PMH significant for HTN, DM type 2, GERD, psoriatic arthritis, DDD s/p fusion of her cervical spine in February, chronic pain, GIB due to duodenitis and esophagitis s/p blood transfusion 04/12/19, and ESRD on HD qMWF admitted with anemia due to GI bleed.    PT Comments    Patient progressing with ambulation distance/actvity tolerance and able to negotiate steps today.  Vitals stable and no noted LOB though some veering in hallway with cane.  She does have some elevation of BP with mobility with BP 189/74, but asymptomatic.  She is already set up with HHPT, would recommend to resume upon d/c.   Follow Up Recommendations  Home health PT(set up through [facility] already)     Equipment Recommendations  Other (comment)(toilet riser)    Recommendations for Other Services       Precautions / Restrictions Precautions Precautions: Fall Required Braces or Orthoses: Cervical Brace Cervical Brace: Hard collar;For comfort    Mobility  Bed Mobility               General bed mobility comments: up in chair  Transfers Overall transfer level: Modified independent Equipment used: Straight cane                Ambulation/Gait Ambulation/Gait assistance: Supervision Gait Distance (Feet): 300 Feet Assistive device: Straight cane Gait Pattern/deviations: Step-through pattern;Drifts right/left;Decreased stride length     General Gait Details: cane for balance, no LOB, but some veering in hallway   Stairs Stairs: Yes Stairs assistance: Min guard;Supervision Stair Management: One rail Left;With cane Number of Stairs: 4(x 2)     Wheelchair Mobility    Modified Rankin (Stroke Patients Only)       Balance Overall balance assessment: Mild deficits observed, not formally tested                                           Cognition Arousal/Alertness: Awake/alert Behavior During Therapy: WFL for tasks assessed/performed Overall Cognitive Status: Within Functional Limits for tasks assessed                                        Exercises      General Comments General comments (skin integrity, edema, etc.): VSS with mobility      Pertinent Vitals/Pain Faces Pain Scale: Hurts a little bit Pain Location: neck and L shoulder Pain Descriptors / Indicators: Discomfort Pain Intervention(s): Monitored during session    Home Living                      Prior Function            PT Goals (current goals can now be found in the care plan section) Progress towards PT goals: Progressing toward goals    Frequency    Min 3X/week      PT Plan Current plan remains appropriate    Co-evaluation              AM-PAC PT "6 Clicks" Mobility   Outcome Measure  Help needed turning from your back to your side while in a flat bed  without using bedrails?: None Help needed moving from lying on your back to sitting on the side of a flat bed without using bedrails?: None Help needed moving to and from a bed to a chair (including a wheelchair)?: None Help needed standing up from a chair using your arms (e.g., wheelchair or bedside chair)?: None Help needed to walk in hospital room?: None Help needed climbing 3-5 steps with a railing? : A Little 6 Click Score: 23    End of Session Equipment Utilized During Treatment: Cervical collar Activity Tolerance: Patient tolerated treatment well Patient left: in chair;with call bell/phone within reach   PT Visit Diagnosis: Other abnormalities of gait and mobility (R26.89)     Time: 8453-6468 PT Time Calculation (min) (ACUTE ONLY): 14 min  Charges:  $Gait Training: 8-22 mins                     Magda Kiel, [place] [phone] 05/20/2019    Reginia Naas 05/20/2019, 4:03 PM

## 2019-05-20 NOTE — Progress Notes (Signed)
PROGRESS NOTE    Jodi Bell  WIO:[id number] DOB: 06/16/1960 DOA: 05/17/2019 PCP: Rudene Anda, MD    Brief Narrative:   Jodi Bell is a 59 y.o. female with medical history significant of anemia of chronic disease, end-stage renal disease on dialysis, type 2 diabetes mellitus, hypertension, GERD, asthma presents with generalized weakness and fatigue the last few days.  She also reports drinking wine since last Friday and her stools have been bloody and dark since then.  She reports being admitted to [facility] hospital for symptomatic anemia on 04/12/2019, underwent an EGD showing esophagitis and duodenitis, 1 unit of PRBC transfusion , and was on PPI.  But patient left the hospital AMA on 04/13/2019.  She presents on 4/6 with the above complaints and was found to have a hemoglobin around 6.  She was admitted to [facility] for further evaluation. Underwent EGd showing reflux esophagitis and non bleeding esophageal ulcer.  Video capsule inserted.  GI on board and await further recommendations.  Assessment & Plan:   Active Problems:   Type 2 diabetes mellitus (HCC)   Essential hypertension   Psoriasis arthropathica (HCC)   GERD (gastroesophageal reflux disease)   COPD (chronic obstructive pulmonary disease) (HCC)   Symptomatic anemia   Asthma   ESRD (end stage renal disease) (HCC)   OSA (obstructive sleep apnea)   GI bleeding   Acute anemia of blood loss probably from the upper GI bleed Patient's hemoglobin around 6.2 on admission and reports melanotic stools and also bright red blood per rectum Gastroenterology consulted and patient underwent EGD showing reflux esophagitis and non bleeding esophageal ulcer.    Video capsule inserted. Continue with PPI and transfuse to keep hemoglobin greater than 7. Patient underwent 1 unit of PRBC transfusion and hemoglobin has been stable between 9-10. 1 small bowel movement overnight.  Patient denies any nausea or vomiting abdominal cramping  with   End-stage renal disease on dialysis Further management as per nephrology. Plan for HD later today.   Hypertensive urgency Blood pressure parameters suboptimally controlled as needed labetalol ordered. Patient is currently on clonidine 0.3 mg 3 times daily, amlodipine 10 mg daily, labetalol 400 mg 3 times daily, isosorbide 60 mg daily and Benicar 40 mg daily.    Type 2 diabetes mellitus with peripheral neuropathy. CBG (last 3)  Recent Labs    05/19/19 1241 05/19/19 1637 05/20/19 1059  GLUCAP 99 149* 154*   Continue with sliding scale insulin Hemoglobin A1c is 4.9    COPD and asthma No wheezing is heard Continue with Spiriva and Ventolin.   Psoriatec arthritis Continue with prednisone.   Hyperkalemia Resolved, currently on Lokelma.  DVT prophylaxis: SCDs Code Status: Full code Family Communication: None at bedside  disposition Plan:  . Patient came from: Home.            . Anticipated d/c place: Home . Barriers to d/c OR conditions which need to be met to effect a safe d/c: pending further work up for GI bleeding with video capsule.    Consultants:   Gastroenterology  Nephrology.   Procedures: EGD shows LA Grade B reflux esophagitis with no bleeding. - Esophageal ulcer with no stigmata of recent bleeding. - Esophageal plaques were found, suspicious for candidiasis. Biopsied. - Normal mucosa was found in the entire stomach.  - A single angioectasia in the duodenum.   Video capsule placement.   Antimicrobials: none.   Subjective: No chest pain or shortness of breath no abdominal pain one  small bowel movement appeared red in color  Objective: Vitals:   05/20/19 0626 05/20/19 0808 05/20/19 0809 05/20/19 1058  BP: (!) 151/67  (!) 154/70 (!) 164/75  Pulse:   70   Resp:  17    Temp:  98 F (36.7 C)  97.7 F (36.5 C)  TempSrc:  Oral  Oral  SpO2:   93%   Weight:      Height:        Intake/Output Summary (Last 24 hours) at 05/20/2019  1334 Last data filed at 05/19/2019 1600 Gross per 24 hour  Intake 378.42 ml  Output --  Net 378.42 ml   Filed Weights   05/17/19 1304 05/17/19 2000 05/18/19 0830  Weight: 71.2 kg 72.6 kg 74.8 kg    Examination:  General exam: Alert and comfortable, no apparent distress Respiratory system: Clear to auscultation bilaterally, no wheezing or rhonchi. Cardiovascular system: 1 S2 heard, regular rate rhythm, no JVD,  gastrointestinal system: Abdomen is soft, nontender, nondistended, bowel sounds normal Central nervous system: Alert and oriented, grossly nonfocal Extremities: No cyanosis or clubbing Skin: No rashes seen Psychiatry: Mood is appropriate     Data Reviewed: I have personally reviewed following labs and imaging studies  CBC: Recent Labs  Lab 05/17/19 1510 05/17/19 1510 05/17/19 2356 05/18/19 0845 05/18/19 2058 05/19/19 0639 05/20/19 0955  WBC 5.5  --  5.4 5.4  --  5.3 6.7  NEUTROABS 4.6  --   --   --   --   --   --   HGB 6.2*   < > 6.0* 6.9* 9.9* 9.5* 10.4*  HCT 20.4*   < > 19.1* 22.0* 29.7* 29.2* 32.1*  MCV 106.3*  --  106.7* 100.5*  --  96.4 96.4  PLT 212  --  218 215  --  191 201   < > = values in this interval not displayed.   Basic Metabolic Panel: Recent Labs  Lab 05/17/19 1510 05/17/19 2356 05/18/19 0844  NA 134* 138 137  K 5.5* 5.0 4.2  CL 94* 97* 95*  CO2 '25 27 25  '$ GLUCOSE 144* 138* 158*  BUN 46* 49* 54*  CREATININE 7.42* 8.20* 8.76*  CALCIUM 8.5* 8.9 8.7*  PHOS  --  6.2* 7.2*   GFR: Estimated Creatinine Clearance: 6.5 mL/min (A) (by C-G formula based on SCr of 8.76 mg/dL (H)). Liver Function Tests: Recent Labs  Lab 05/17/19 2356 05/18/19 0844  ALBUMIN 3.2* 3.2*   No results for input(s): LIPASE, AMYLASE in the last 168 hours. No results for input(s): AMMONIA in the last 168 hours. Coagulation Profile: No results for input(s): INR, PROTIME in the last 168 hours. Cardiac Enzymes: No results for input(s): CKTOTAL, CKMB, CKMBINDEX,  TROPONINI in the last 168 hours. BNP (last 3 results) No results for input(s): PROBNP in the last 8760 hours. HbA1C: Recent Labs    05/19/19 0639  HGBA1C 4.9   CBG: Recent Labs  Lab 05/18/19 0023 05/19/19 1241 05/19/19 1637 05/20/19 1059  GLUCAP 121* 99 149* 154*   Lipid Profile: No results for input(s): CHOL, HDL, LDLCALC, TRIG, CHOLHDL, LDLDIRECT in the last 72 hours. Thyroid Function Tests: No results for input(s): TSH, T4TOTAL, FREET4, T3FREE, THYROIDAB in the last 72 hours. Anemia Panel: No results for input(s): VITAMINB12, FOLATE, FERRITIN, TIBC, IRON, RETICCTPCT in the last 72 hours. Sepsis Labs: No results for input(s): PROCALCITON, LATICACIDVEN in the last 168 hours.  Recent Results (from the past 240 hour(s))  Respiratory Panel by RT PCR (Flu  A&B, Covid) - Nasopharyngeal Swab     Status: None   Collection Time: 05/17/19  5:14 PM   Specimen: Nasopharyngeal Swab  Result Value Ref Range Status   SARS Coronavirus 2 by RT PCR NEGATIVE NEGATIVE Final    Comment: (NOTE) SARS-CoV-2 target nucleic acids are NOT DETECTED. The SARS-CoV-2 RNA is generally detectable in upper respiratoy specimens during the acute phase of infection. The lowest concentration of SARS-CoV-2 viral copies this assay can detect is 131 copies/mL. A negative result does not preclude SARS-Cov-2 infection and should not be used as the sole basis for treatment or other patient management decisions. A negative result may occur with  improper specimen collection/handling, submission of specimen other than nasopharyngeal swab, presence of viral mutation(s) within the areas targeted by this assay, and inadequate number of viral copies (<131 copies/mL). A negative result must be combined with clinical observations, patient history, and epidemiological information. The expected result is Negative. Fact Sheet for Patients:  PinkCheek.be Fact Sheet for Healthcare Providers:   GravelBags.it This test is not yet ap proved or cleared by the [place] FDA and  has been authorized for detection and/or diagnosis of SARS-CoV-2 by FDA under an Emergency Use Authorization (EUA). This EUA will remain  in effect (meaning this test can be used) for the duration of the COVID-19 declaration under Section 564(b)(1) of the Act, 21 U.S.C. section 360bbb-3(b)(1), unless the authorization is terminated or revoked sooner.    Influenza A by PCR NEGATIVE NEGATIVE Final   Influenza B by PCR NEGATIVE NEGATIVE Final    Comment: (NOTE) The Xpert Xpress SARS-CoV-2/FLU/RSV assay is intended as an aid in  the diagnosis of influenza from Nasopharyngeal swab specimens and  should not be used as a sole basis for treatment. Nasal washings and  aspirates are unacceptable for Xpert Xpress SARS-CoV-2/FLU/RSV  testing. Fact Sheet for Patients: PinkCheek.be Fact Sheet for Healthcare Providers: GravelBags.it This test is not yet approved or cleared by the [place] FDA and  has been authorized for detection and/or diagnosis of SARS-CoV-2 by  FDA under an Emergency Use Authorization (EUA). This EUA will remain  in effect (meaning this test can be used) for the duration of the  Covid-19 declaration under Section 564(b)(1) of the Act, 21  U.S.C. section 360bbb-3(b)(1), unless the authorization is  terminated or revoked. Performed at [facility], [address] [place], [place] [address]   MRSA PCR Screening     Status: None   Collection Time: 05/17/19  9:00 PM   Specimen: Nasal Mucosa; Nasopharyngeal  Result Value Ref Range Status   MRSA by PCR NEGATIVE NEGATIVE Final    Comment:        The GeneXpert MRSA Assay (FDA approved for NASAL specimens only), is one component of a comprehensive MRSA colonization surveillance program. It is not intended to diagnose MRSA infection nor to  guide or monitor treatment for MRSA infections. Performed at [facility], [place] [address], [place], [place] [address]          Radiology Studies: No results found.      Scheduled Meds: . sodium chloride   Intravenous Once  . sodium chloride   Intravenous Once  . allopurinol  100 mg Oral QPM  . amLODipine  10 mg Oral QHS  . calcium acetate  2,001 mg Oral TID WC  . Chlorhexidine Gluconate Cloth  6 each Topical Q0600  . Chlorhexidine Gluconate Cloth  6 each Topical Q0600  . cinacalcet  30 mg Oral Q breakfast  . cloNIDine  0.3 mg Oral TID  . ferrous sulfate  325 mg Oral Q T,Th,Sat-1800  . fluticasone  1 spray Each Nare Daily  . folic acid  1 mg Oral Daily  . furosemide  40 mg Oral BID  . gabapentin  300 mg Oral Q supper  . hydrALAZINE  100 mg Oral Q8H  . insulin aspart  0-5 Units Subcutaneous QHS  . insulin aspart  0-9 Units Subcutaneous TID WC  . irbesartan  150 mg Oral Daily  . isosorbide mononitrate  60 mg Oral Daily  . labetalol  400 mg Oral TID  . mometasone-formoterol  2 puff Inhalation BID  . montelukast  10 mg Oral Q supper  . pantoprazole (PROTONIX) IV  40 mg Intravenous Q12H  . predniSONE  5 mg Oral Q breakfast  . senna  1 tablet Oral Daily  . sodium zirconium cyclosilicate  10 g Oral Daily  . umeclidinium bromide  1 puff Inhalation Daily   Continuous Infusions:   LOS: 3 days      Hosie Poisson, MD [facility]   To contact the attending provider between 7A-7P or the covering provider during after hours 7P-7A, please log into the web site [email or web address] and access using universal [place] password for that web site. If you do not have the password, please call the hospital operator.  05/20/2019, 1:34 PM

## 2019-05-20 NOTE — Progress Notes (Signed)
[facility] Gastroenterology Progress Note  Jodi Bell 59 y.o. 1960/08/31  CC: GI bleed   Subjective: Patient seen and examined at bedside.  Complaining of streaks of fresh blood in the stool but no black tarry stool.  Denies abdominal pain.  ROS : Afebrile.  Negative for chest pain   Objective: Vital signs in last 24 hours: Vitals:   05/20/19 0809 05/20/19 1058  BP: (!) 154/70 (!) 164/75  Pulse: 70   Resp:    Temp:  97.7 F (36.5 C)  SpO2: 93%     Physical Exam:  General.  Well developed.  Sitting comfortably.  Not in acute distress Abdomen.  Soft, nontender, nondistended, bowel sounds present.  No peritoneal signs  Lab Results: Recent Labs    05/17/19 2356 05/18/19 0844  NA 138 137  K 5.0 4.2  CL 97* 95*  CO2 27 25  GLUCOSE 138* 158*  BUN 49* 54*  CREATININE 8.20* 8.76*  CALCIUM 8.9 8.7*  PHOS 6.2* 7.2*   Recent Labs    05/17/19 2356 05/18/19 0844  ALBUMIN 3.2* 3.2*   Recent Labs    05/17/19 1510 05/17/19 2356 05/19/19 0639 05/20/19 0955  WBC 5.5   < > 5.3 6.7  NEUTROABS 4.6  --   --   --   HGB 6.2*   < > 9.5* 10.4*  HCT 20.4*   < > 29.2* 32.1*  MCV 106.3*   < > 96.4 96.4  PLT 212   < > 191 201   < > = values in this interval not displayed.   No results for input(s): LABPROT, INR in the last 72 hours.    Assessment/Plan: -GI bleed with black tarry stool and streaks of fresh blood in the stool.  Had EGD and colonoscopy in March 2021 at [facility].  EGD showed gastritis and duodenitis.  Colonoscopy showed tubular adenoma.  Full report not available to review. -Acute blood loss anemia.  Improved with blood transfusion -End-stage renal disease.  On dialysis  Recommendations -------------------------- -EGD yesterday showed LA grade D esophagitis and esophageal ulcer.  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  Capsule was deployed.  -Follow capsule study findings- report pending -Advance diet as tolerated -Hemoglobin  is stable. -GI will follow   Otis Brace MD, [place] 05/20/2019, 2:06 PM  Contact #  [phone]

## 2019-05-20 NOTE — Progress Notes (Signed)
Pt removed her cardiac monitor, and refused to put back on

## 2019-05-20 NOTE — Progress Notes (Signed)
[facility] Progress Note  Subjective: seen in room, no c/o's. Waiting for video results.   Vitals:   05/20/19 1058 05/20/19 1410 05/20/19 1503 05/20/19 1559  BP: (!) 164/75 (!) 165/70 (!) 189/74   Pulse:   73   Resp:      Temp: 97.7 F (36.5 C)   97.8 F (36.6 C)  TempSrc: Oral   Oral  SpO2:   99%   Weight:      Height:        Exam:  alert, obese, no distress   No jvd   Chest occ rales at bases o/w clear   Cor reg no RG   Abd soft ntnd obese     Ext no edema    LUE AVF +bruit    Dialysis: [facility] MWF  3.5h  2/2.5 bath  73kg  Hep none  L AVF  400/700  Assessment/ Plan: 1. ABLA/ GI bleed- EGD today showed esophagitis w/ varices. Video capsule results pending. On PPI , prn prbc's.  Hb 10 today after transfusion. 2.  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 HD today, may have to postpone until Sat d/t high census.   3. Hyperkalemia - resolved 4.  Hypertension/volume  - stable on exam, up 1-2kg 5.  Metabolic bone disease -  Cont with home meds 6.  Nutrition - renal diet, carb modified, npo after midnight for likely EGD tomorrow. 7. Chronic pain- ok to resume meds     Jodi Bell 05/20/2019, 5:15 PM   Recent Labs  Lab 05/17/19 2356 05/18/19 0844 05/18/19 0845 05/19/19 0639 05/20/19 0955  K 5.0 4.2  --   --   --   BUN 49* 54*  --   --   --   CREATININE 8.20* 8.76*  --   --   --   CALCIUM 8.9 8.7*  --   --   --   PHOS 6.2* 7.2*  --   --   --   HGB 6.0*  --    < > 9.5* 10.4*   < > = values in this interval not displayed.   Inpatient medications: . sodium chloride   Intravenous Once  . sodium chloride   Intravenous Once  . allopurinol  100 mg Oral QPM  . amLODipine  10 mg Oral QHS  . calcium acetate  2,001 mg Oral TID WC  . Chlorhexidine Gluconate Cloth  6 each Topical Q0600  . Chlorhexidine Gluconate Cloth  6 each Topical Q0600  . cinacalcet  30 mg Oral Q breakfast  . cloNIDine  0.3 mg Oral TID  . ferrous sulfate  325 mg Oral Q T,Th,Sat-1800   . fluticasone  1 spray Each Nare Daily  . folic acid  1 mg Oral Daily  . furosemide  40 mg Oral BID  . gabapentin  300 mg Oral Q supper  . hydrALAZINE  100 mg Oral Q8H  . insulin aspart  0-5 Units Subcutaneous QHS  . insulin aspart  0-9 Units Subcutaneous TID WC  . irbesartan  150 mg Oral Daily  . isosorbide mononitrate  60 mg Oral Daily  . labetalol  400 mg Oral TID  . mometasone-formoterol  2 puff Inhalation BID  . montelukast  10 mg Oral Q supper  . pantoprazole (PROTONIX) IV  40 mg Intravenous Q12H  . predniSONE  5 mg Oral Q breakfast  . senna  1 tablet Oral Daily  . sodium zirconium cyclosilicate  10 g Oral Daily  .  umeclidinium bromide  1 puff Inhalation Daily    acetaminophen, albuterol, labetalol, LORazepam, methocarbamol, oxyCODONE, zolpidem

## 2019-05-20 NOTE — Plan of Care (Signed)

## 2019-05-20 NOTE — Procedures (Signed)
-  Capsule endoscopy showed few erosions and one small nonbleeding AVM in the proximal jejunum.  No active bleeding.  -Okay to discharge from GI standpoint.  GI will sign off.  Call us back if needed -Recommend follow-up with primary GI at [facility] after discharge  Otis Brace MD, [place] 05/20/2019, 3:44 PM  Contact #  [phone]

## 2019-05-20 NOTE — Progress Notes (Signed)
Nutrition Brief Note  Patient identified on the Malnutrition Screening Tool (MST) Report  Wt Readings from Last 15 Encounters:  05/18/19 74.8 kg  09/24/18 67.8 kg  09/15/18 67.8 kg  05/20/18 75.7 kg  04/21/18 78.9 kg  02/27/18 84 kg  12/14/17 78.5 kg  11/12/17 81.2 kg  11/06/17 81.2 kg  09/23/17 80.7 kg  06/25/17 80.7 kg  06/19/17 81 kg  02/07/16 92.5 kg  03/14/15 88 kg  09/04/14 87.1 kg   Jodi Bell is a 59 y.o. female with medical history significant of anemia of chronic disease, end-stage renal disease on dialysis, type 2 diabetes mellitus, hypertension, GERD, asthma presents with generalized weakness and fatigue the last few days.  She also reports drinking wine since last Friday and her stools have been bloody and dark since then.  She reports being admitted to [facility] hospital for symptomatic anemia on 04/12/2019, underwent an EGD showing esophagitis and duodenitis, 1 unit of PRBC transfusion , and was on PPI.  But patient left the hospital AMA on 04/13/2019.  She presents yesterday with the above complaints and was found to have a hemoglobin around 6.  She was admitted to [facility] for further evaluation.  Pt admitted with acute anemia of blood loss secondary to upper GIB.   4/8- s/p upper GI endoscopy- revealed LA grade B reflux esophagitis with no bleeding, esophageal ulcer with no stigmata of recent bleeding; esophageal plaques found (?cadidiasis, biopsied); single angioectasia in duodenum (s/p APC), video capsule inserted  Reviewed I/O's: +578 ml x 24 hours and +194 ml since admission  Spoke with pt, who was sitting in recliner chair, finishing lunch at time of visit (pt consumed all of her cottage cheese and fruit plate). Pt shares her appetite has been good; noted meal completion documented at 50-100%. PTA pt reports improved appetite, generally consuming 2 meals per day on non-HD days (meals include oatmeal and caramel macchiato from [employer] or a plate from  [employer] chicken). On HD days, she still consumes 2 meals, but consumes a snack (Little Debbie Cake or bagel) prior to HD.   Pt denies any weight loss. She shares that she had a history of losing weight, but has gained most of it back. She shares that her clothes have been fitting more snug lately secondary to weight gain. Per wt hx, wt has been stable over the past year. Nephrology notes indicate EDW 73 kg.   Nutrition-Focused physical exam completed. Findings are no fat depletion, no muscle depletion, and no edema.   Pt denies any further nutrition-related concerns, however, appreciative of RD visit.   Lab Results  Component Value Date   HGBA1C 5.5 02/28/2018   PTA DM medications are 25 mg [place] daily.   Labs reviewed: CBGS: 99-149 (inpatient orders for glycemic control are 0-5 units inuslin aspart q HS, 0-9 units insulin aspart TID with meals).   Body mass index is 30.16 kg/m. Patient meets criteria for obesity, class I based on current BMI.   Current diet order is renal, carb modified with 1.2 L fluid restriction, patient is consuming approximately 50-100% of meals at this time. Labs and medications reviewed.   No nutrition interventions warranted at this time. If nutrition issues arise, please consult RD.   Loistine Chance, RD, LDN, [place] Registered Dietitian II Certified Diabetes Care and Education Specialist Please refer to [facility] for RD and/or RD on-call/weekend/after hours pager

## 2019-05-21 DIAGNOSIS — R195 Other fecal abnormalities: Secondary | ICD-10-CM

## 2019-05-21 DIAGNOSIS — E875 Hyperkalemia: Secondary | ICD-10-CM

## 2019-05-21 LAB — GLUCOSE, CAPILLARY: Glucose-Capillary: 171 mg/dL — ABNORMAL HIGH (ref 70–99)

## 2019-05-21 LAB — POTASSIUM: Potassium: 4.6 mmol/L (ref 3.5–5.1)

## 2019-05-21 MED ORDER — CLONIDINE HCL 0.3 MG PO TABS: 0.3000 mg | ORAL_TABLET | Freq: Three times a day (TID) | ORAL | Status: DC

## 2019-05-21 MED ORDER — ONDANSETRON HCL 4 MG PO TABS: 4.0000 mg | ORAL_TABLET | Freq: Three times a day (TID) | ORAL | 0 refills | Status: AC | PRN

## 2019-05-21 MED ORDER — ESOMEPRAZOLE MAGNESIUM 20 MG PO CPDR: 40.0000 mg | DELAYED_RELEASE_CAPSULE | Freq: Every day | ORAL | 1 refills | Status: DC

## 2019-05-21 NOTE — Plan of Care (Signed)
  Problem: Education: Goal: Knowledge of General Education information will improve Description: Including pain rating scale, medication(s)/side effects and non-pharmacologic comfort measures Outcome: Progressing   Problem: Clinical Measurements: Goal: Ability to maintain clinical measurements within normal limits will improve Outcome: Progressing Goal: Will remain free from infection Outcome: Progressing   

## 2019-05-21 NOTE — Progress Notes (Signed)
[facility] Progress Note  Subjective: going home today  Vitals:   05/21/19 0318 05/21/19 0621 05/21/19 0823 05/21/19 1122  BP: (!) 164/75 (!) 171/77 (!) 181/77 (!) 150/68  Pulse: 85  90 76  Resp: 16   19  Temp: 98.1 F (36.7 C)   98.1 F (36.7 C)  TempSrc: Oral   Oral  SpO2: 97%   96%  Weight: 78.1 kg     Height:        Exam:  alert, obese, no distress   No jvd   Chest occ rales at bases o/w clear   Cor reg no RG   Abd soft ntnd obese     Ext no edema    LUE AVF +bruit    Dialysis: [facility] MWF  3.5h  2/2.5 bath  73kg  Hep none  L AVF  400/700  Assessment/ Plan: 1. ABLA/ GI bleed- EGD today showed esophagitis w/ varices. Video capsule results pending. On PPI , prn prbc's.  Hb 9.5. Going home today.  2.  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 Had HD last night.  3. Hyperkalemia - resolved 4.  Hypertension/volume  - stable on exam, up 1-2kg 5.  Metabolic bone disease -  Cont with home meds 6.  Nutrition - renal diet, carb modified, npo after midnight for likely EGD tomorrow. 7. Chronic pain- ok to resume meds     Rob Maureen Delatte 05/21/2019, 2:51 PM   Recent Labs  Lab 05/17/19 2356 05/17/19 2356 05/18/19 0844 05/18/19 0844 05/18/19 0845 05/19/19 0639 05/20/19 0955 05/20/19 1857 05/21/19 1055  K 5.0   < > 4.2   < >  --   --   --  5.3* 4.6  BUN 49*   < > 54*  --   --   --   --  42*  --   CREATININE 8.20*   < > 8.76*  --   --   --   --  9.19*  --   CALCIUM 8.9   < > 8.7*  --   --   --   --  9.6  --   PHOS 6.2*  --  7.2*  --   --   --   --   --   --   HGB 6.0*  --   --   --    < > 9.5* 10.4*  --   --    < > = values in this interval not displayed.   Inpatient medications: . sodium chloride   Intravenous Once  . sodium chloride   Intravenous Once  . allopurinol  100 mg Oral QPM  . amLODipine  10 mg Oral QHS  . calcium acetate  2,001 mg Oral TID WC  . Chlorhexidine Gluconate Cloth  6 each Topical Q0600  . Chlorhexidine Gluconate Cloth  6 each  Topical Q0600  . cinacalcet  30 mg Oral Q breakfast  . cloNIDine  0.3 mg Oral TID  . ferrous sulfate  325 mg Oral Q T,Th,Sat-1800  . fluticasone  1 spray Each Nare Daily  . folic acid  1 mg Oral Daily  . furosemide  40 mg Oral BID  . gabapentin  300 mg Oral Q supper  . hydrALAZINE  100 mg Oral Q8H  . insulin aspart  0-5 Units Subcutaneous QHS  . insulin aspart  0-9 Units Subcutaneous TID WC  . irbesartan  150 mg Oral Daily  . isosorbide mononitrate  60 mg Oral  Daily  . labetalol  400 mg Oral TID  . mometasone-formoterol  2 puff Inhalation BID  . montelukast  10 mg Oral Q supper  . pantoprazole (PROTONIX) IV  40 mg Intravenous Q12H  . predniSONE  5 mg Oral Q breakfast  . senna  1 tablet Oral Daily  . sodium zirconium cyclosilicate  10 g Oral Daily  . umeclidinium bromide  1 puff Inhalation Daily    acetaminophen, albuterol, labetalol, LORazepam, methocarbamol, oxyCODONE, zolpidem

## 2019-05-21 NOTE — Progress Notes (Addendum)
Pt refusing to be on wall monitor at handoff, said she is not here for any cardiac issues. I educated her on the importance of heart monitors so if anything does go wrong I know to come in. She agreed to be a on a tele box after finishing breakfast.    0804: Came in with the tele box and patient refused the tele box, saying "I'm a GI patient and they just don't have anywhere else to put me" and "my rhythm is fine"

## 2019-05-21 NOTE — Care Management (Signed)
Verified patient is active w [facility] for [facility] PT. Notified liaison that she is DC to home today to resume services.

## 2019-05-21 NOTE — Progress Notes (Signed)
RT Late note: Pt declined use of CPAP dream station tonight stating she is going home as soon as someone comes to get her. RT will continue to monitor.

## 2019-05-21 NOTE — Progress Notes (Signed)
Discharge instruction given. Patient verbalized understanding and all questions were answered.

## 2019-05-22 NOTE — Discharge Summary (Signed)
Physician Discharge Summary  Jodi Bell HDQ:[id number] DOB: [birth year] DOA: 05/17/2019  PCP: Rudene Anda, MD  Admit date: 05/17/2019 Discharge date: 05/21/2019  Admitted From: Home Disposition: Home  Recommendations for Outpatient Follow-up:  1. Follow up with PCP in 1-2 weeks 2. Please obtain BMP/CBC in one week 3. Please follow up with gastroenterology as needed 4. Follow-up with nephrology as needed.  Home Health: Yes  Discharge Condition:stable.  CODE STATUS: full code.  Diet recommendation: Heart Healthy  Brief/Interim Summary: Jodi Bell a 59 y.o.femalewith medical history significant ofanemia of chronic disease, end-stage renal disease on dialysis, type 2 diabetes mellitus, hypertension, GERD, asthma presents with generalized weakness and fatigue the last few days. She also reports drinking wine since last Friday and her stools have been bloody and dark since then. She reports being admitted to [facility] hospital for symptomatic anemia on 04/12/2019, underwent an EGD showing esophagitis and duodenitis, 1 unit of PRBC transfusion ,and was on PPI. But patient left the hospital AMA on 04/13/2019. She presents on 4/6 with the above complaints and was found to have a hemoglobin around 6. She was admitted to [facility] for further evaluation. Underwent EGd showing reflux esophagitis and non bleeding esophageal ulcer.  Video capsule inserted no new abnormalities noted. She was cleared for discharge from GI, and pt did not want to wait for HD today.   Discharge Diagnoses:  Active Problems:   Type 2 diabetes mellitus (HCC)   Essential hypertension   Psoriasis arthropathica (HCC)   GERD (gastroesophageal reflux disease)   COPD (chronic obstructive pulmonary disease) (HCC)   Symptomatic anemia   Asthma   ESRD (end stage renal disease) (HCC)   OSA (obstructive sleep apnea)   GI bleeding  Acute anemia of blood loss probably from the upper GI bleed Patient's hemoglobin  around 6.2 on admission and reports melanotic stools and also bright red blood per rectum Gastroenterology consulted and patient underwent EGD showing reflux esophagitis and non bleeding esophageal ulcer.    Video capsule inserted. Continue with PPI and transfuse to keep hemoglobin greater than 7. Patient underwent 1 unit of PRBC transfusion and hemoglobin has been stable between 9-10. 1 small bowel movement overnight.  Patient denies any nausea or vomiting abdominal cramping .  Video capsule study wnl as per GI and she is cleared for discharge.    End-stage renal disease on dialysis Further management as per nephrology.    Hypertensive urgency Blood pressure parameters suboptimally controlled as needed labetalol ordered. Patient is currently on clonidine 0.3 mg 3 times daily, amlodipine 10 mg daily, labetalol 400 mg 3 times daily, isosorbide 60 mg daily and Benicar 40 mg daily.    Type 2 diabetes mellitus with peripheral neuropathy. CBG (last 3)  Recent Labs (last 2 labs)        Recent Labs    05/19/19 1241 05/19/19 1637 05/20/19 1059  GLUCAP 99 149* 154*     Continue with sliding scale insulin Hemoglobin A1c is 4.9    COPD and asthma No wheezing is heard Continue with Spiriva and Ventolin.   Psoriatec arthritis Continue with prednisone.   Hyperkalemia Resolved, currently on Lokelma.    Discharge Instructions  Discharge Instructions    Diet - low sodium heart healthy   Complete by: As directed    Discharge instructions   Complete by: As directed    Please follow up with Nephrology as needed.     Allergies as of 05/21/2019      Reactions  Banana Anaphylaxis, Swelling   TONGUE AND MOUTH SWELLING   Black Walnut Flavor Anaphylaxis   Walnuts   Hazelnut (filbert) Allergy Skin Test Anaphylaxis   Hazelnuts   No Healthtouch Food Allergies Anaphylaxis   Spicy Mustard 4.7.2021 Pt reports that she eats Regular Yellow Mustard   Pecan Nut  (diagnostic) Anaphylaxis   Pecans   Adalimumab Other (See Comments)   Blisters on abdomen =humira   Ezetimibe Other (See Comments)   myalgias   Ibuprofen Other (See Comments)   Renal Problems   Leflunomide And Related Other (See Comments)   Severe Headache   Lexapro [escitalopram Oxalate] Other (See Comments)   Hand problems - Serotonin Syndrome    Lisinopril Swelling   Angioedema    Secukinumab    Cosentyx = angiodema   Statins Other (See Comments)   Leg pains and weakness   Trazodone And Nefazodone Other (See Comments)   Serotonin Syndrome       Medication List    STOP taking these medications   bisoprolol 10 MG tablet Commonly known as: ZEBETA   ezetimibe 10 MG tablet Commonly known as: ZETIA     TAKE these medications   acetaminophen 500 MG tablet Commonly known as: TYLENOL Take 1,000 mg by mouth every 6 (six) hours as needed for mild pain.   allopurinol 100 MG tablet Commonly known as: ZYLOPRIM Take 100 mg by mouth every evening.   amLODipine 10 MG tablet Commonly known as: NORVASC Take 10 mg by mouth daily.   budesonide-formoterol 160-4.5 MCG/ACT inhaler Commonly known as: SYMBICORT Inhale 2 puffs into the lungs 2 (two) times daily.   calcium acetate 667 MG capsule Commonly known as: PHOSLO Take 3 capsules by mouth in the morning and at bedtime.   carboxymethylcellulose 1 % ophthalmic solution Place 1 drop into both eyes 3 (three) times daily.   cetirizine 10 MG tablet Commonly known as: ZYRTEC Take 10 mg by mouth 2 (two) times daily as needed (hives).   cinacalcet 30 MG tablet Commonly known as: SENSIPAR Take 30 mg by mouth daily.   cloNIDine 0.3 MG tablet Commonly known as: CATAPRES Take 1 tablet (0.3 mg total) by mouth 3 (three) times daily. What changed: when to take this   ELDERBERRY PO Take 2 drops by mouth daily.   EPINEPHrine 0.3 mg/0.3 mL Soaj injection Commonly known as: EpiPen Inject 0.3 mLs (0.3 mg total) into the muscle  once. What changed:   when to take this  reasons to take this   esomeprazole 20 MG capsule Commonly known as: NEXIUM Take 2 capsules (40 mg total) by mouth daily. What changed: how much to take   ferrous sulfate 325 (65 FE) MG tablet Take 325 mg by mouth 3 (three) times a week. TUE THUR SAT   fluticasone 50 MCG/ACT nasal spray Commonly known as: FLONASE Place 1 spray into both nostrils daily.   folic acid 073 MCG tablet Commonly known as: FOLVITE Take 400 mcg by mouth daily.   furosemide 40 MG tablet Commonly known as: LASIX Take 40 mg by mouth 2 (two) times daily.   gabapentin 300 MG capsule Commonly known as: Neurontin Take 1 capsule (300 mg total) by mouth at bedtime as needed (Neuropathic pain). What changed: when to take this   hydrALAZINE 50 MG tablet Commonly known as: APRESOLINE Take 1 tablet (50 mg total) by mouth every 8 (eight) hours. What changed: how much to take   isosorbide mononitrate 60 MG 24 hr tablet Commonly known as:  IMDUR Take 1 tablet (60 mg total) by mouth daily.   Januvia 25 MG tablet Generic drug: sitaGLIPtin Take 25 mg by mouth daily.   labetalol 200 MG tablet Commonly known as: NORMODYNE Take 400 mg by mouth 3 (three) times daily.   LORazepam 0.5 MG tablet Commonly known as: ATIVAN Take 0.5 mg by mouth every 8 (eight) hours as needed for anxiety.   methocarbamol 500 MG tablet Commonly known as: ROBAXIN Take 500 mg by mouth 4 (four) times daily as needed for muscle pain. neck   montelukast 10 MG tablet Commonly known as: SINGULAIR Take 10 mg by mouth daily with supper.   multivitamin Tabs tablet Take 1 tablet by mouth daily with supper.   olmesartan 40 MG tablet Commonly known as: BENICAR Take 40 mg by mouth daily.   Omega-3 1000 MG Caps Take 1,000 mg by mouth in the morning, at noon, and at bedtime.   ondansetron 4 MG tablet Commonly known as: Zofran Take 1 tablet (4 mg total) by mouth every 8 (eight) hours as needed  for nausea or vomiting.   Oxycodone HCl 10 MG Tabs Take 1 tablet (10 mg total) by mouth every 6 (six) hours as needed (pain score 7-10).   predniSONE 5 MG tablet Commonly known as: DELTASONE Take 5 mg by mouth daily with breakfast.   PROBIOTIC PO Take 1 capsule by mouth daily.   senna 8.6 MG tablet Commonly known as: SENOKOT Take 1-2 tablets by mouth daily as needed for constipation.   SPIRIVA RESPIMAT IN Inhale 2 puffs into the lungs daily.   Ventolin HFA 108 (90 Base) MCG/ACT inhaler Generic drug: albuterol Inhale 1-2 puffs into the lungs every 6 (six) hours as needed for shortness of breath. wheezing   Xeljanz 5 MG Tabs Generic drug: Tofacitinib Citrate Take 1 tablet by mouth at bedtime.   zolpidem 5 MG tablet Commonly known as: AMBIEN Take 5 mg by mouth at bedtime.      Follow-up Information    [facility]   Specialty: Home Health Services Why: For home health services Contact information: [address] [place] [address] [phone]          Allergies  Allergen Reactions  . Banana Anaphylaxis and Swelling    TONGUE AND MOUTH SWELLING  . Black Walnut Flavor Anaphylaxis    Walnuts  . Hazelnut (Filbert) Allergy Skin Test Anaphylaxis    Hazelnuts  . No Healthtouch Food Allergies Anaphylaxis    Spicy Mustard 4.7.2021 Pt reports that she eats Regular Yellow Mustard  . Pecan Nut (Diagnostic) Anaphylaxis    Pecans  . Adalimumab Other (See Comments)    Blisters on abdomen =humira  . Ezetimibe Other (See Comments)    myalgias  . Ibuprofen Other (See Comments)    Renal Problems  . Leflunomide And Related Other (See Comments)    Severe Headache  . Lexapro [Escitalopram Oxalate] Other (See Comments)    Hand problems - Serotonin Syndrome   . Lisinopril Swelling    Angioedema   . Secukinumab     Cosentyx = angiodema  . Statins Other (See Comments)    Leg pains and weakness  . Trazodone And Nefazodone Other (See Comments)     Serotonin Syndrome     Consultations:  Nephrology  Gastroenterology   Procedures/Studies:  No results found.    Subjective: No new complaints  Discharge Exam: Vitals:   05/21/19 0823 05/21/19 1122  BP: (!) 181/77 (!) 150/68  Pulse: 90 76  Resp:  19  Temp:  98.1 F (36.7 C)  SpO2:  96%   Vitals:   05/21/19 0318 05/21/19 0621 05/21/19 0823 05/21/19 1122  BP: (!) 164/75 (!) 171/77 (!) 181/77 (!) 150/68  Pulse: 85  90 76  Resp: 16   19  Temp: 98.1 F (36.7 C)   98.1 F (36.7 C)  TempSrc: Oral   Oral  SpO2: 97%   96%  Weight: 78.1 kg     Height:        General: Pt is alert, awake, not in acute distress Cardiovascular: RRR, S1/S2 +, no rubs, no gallops Respiratory: CTA bilaterally, no wheezing, no rhonchi Abdominal: Soft, NT, ND, bowel sounds + Extremities: no edema, no cyanosis    The results of significant diagnostics from this hospitalization (including imaging, microbiology, ancillary and laboratory) are listed below for reference.     Microbiology: Recent Results (from the past 240 hour(s))  Respiratory Panel by RT PCR (Flu A&B, Covid) - Nasopharyngeal Swab     Status: None   Collection Time: 05/17/19  5:14 PM   Specimen: Nasopharyngeal Swab  Result Value Ref Range Status   SARS Coronavirus 2 by RT PCR NEGATIVE NEGATIVE Final    Comment: (NOTE) SARS-CoV-2 target nucleic acids are NOT DETECTED. The SARS-CoV-2 RNA is generally detectable in upper respiratoy specimens during the acute phase of infection. The lowest concentration of SARS-CoV-2 viral copies this assay can detect is 131 copies/mL. A negative result does not preclude SARS-Cov-2 infection and should not be used as the sole basis for treatment or other patient management decisions. A negative result may occur with  improper specimen collection/handling, submission of specimen other than nasopharyngeal swab, presence of viral mutation(s) within the areas targeted by this assay, and  inadequate number of viral copies (<131 copies/mL). A negative result must be combined with clinical observations, patient history, and epidemiological information. The expected result is Negative. Fact Sheet for Patients:  PinkCheek.be Fact Sheet for Healthcare Providers:  GravelBags.it This test is not yet ap proved or cleared by the [place] FDA and  has been authorized for detection and/or diagnosis of SARS-CoV-2 by FDA under an Emergency Use Authorization (EUA). This EUA will remain  in effect (meaning this test can be used) for the duration of the COVID-19 declaration under Section 564(b)(1) of the Act, 21 U.S.C. section 360bbb-3(b)(1), unless the authorization is terminated or revoked sooner.    Influenza A by PCR NEGATIVE NEGATIVE Final   Influenza B by PCR NEGATIVE NEGATIVE Final    Comment: (NOTE) The Xpert Xpress SARS-CoV-2/FLU/RSV assay is intended as an aid in  the diagnosis of influenza from Nasopharyngeal swab specimens and  should not be used as a sole basis for treatment. Nasal washings and  aspirates are unacceptable for Xpert Xpress SARS-CoV-2/FLU/RSV  testing. Fact Sheet for Patients: PinkCheek.be Fact Sheet for Healthcare Providers: GravelBags.it This test is not yet approved or cleared by the [place] FDA and  has been authorized for detection and/or diagnosis of SARS-CoV-2 by  FDA under an Emergency Use Authorization (EUA). This EUA will remain  in effect (meaning this test can be used) for the duration of the  Covid-19 declaration under Section 564(b)(1) of the Act, 21  U.S.C. section 360bbb-3(b)(1), unless the authorization is  terminated or revoked. Performed at [facility], [address] [place], [place] [address]   MRSA PCR Screening     Status: None   Collection Time: 05/17/19  9:00 PM   Specimen: Nasal Mucosa;  Nasopharyngeal  Result Value Ref Range Status   MRSA by PCR NEGATIVE NEGATIVE Final    Comment:        The GeneXpert MRSA Assay (FDA approved for NASAL specimens only), is one component of a comprehensive MRSA colonization surveillance program. It is not intended to diagnose MRSA infection nor to guide or monitor treatment for MRSA infections. Performed at [facility], [facility] [address] [place], [place] [address]      Labs: BNP (last 3 results) No results for input(s): BNP in the last 8760 hours. Basic Metabolic Panel: Recent Labs  Lab 05/17/19 1510 05/17/19 2356 05/18/19 0844 05/20/19 1857 05/21/19 1055  NA 134* 138 137 136  --   K 5.5* 5.0 4.2 5.3* 4.6  CL 94* 97* 95* 97*  --   CO2 25 27 25 23   --   GLUCOSE 144* 138* 158* 136*  --   BUN 46* 49* 54* 42*  --   CREATININE 7.42* 8.20* 8.76* 9.19*  --   CALCIUM 8.5* 8.9 8.7* 9.6  --   PHOS  --  6.2* 7.2*  --   --    Liver Function Tests: Recent Labs  Lab 05/17/19 2356 05/18/19 0844 05/20/19 1857  AST  --   --  15  ALT  --   --  10  ALKPHOS  --   --  53  BILITOT  --   --  1.0  PROT  --   --  6.0*  ALBUMIN 3.2* 3.2* 3.4*   No results for input(s): LIPASE, AMYLASE in the last 168 hours. No results for input(s): AMMONIA in the last 168 hours. CBC: Recent Labs  Lab 05/17/19 1510 05/17/19 1510 05/17/19 2356 05/18/19 0845 05/18/19 2058 05/19/19 0639 05/20/19 0955  WBC 5.5  --  5.4 5.4  --  5.3 6.7  NEUTROABS 4.6  --   --   --   --   --   --   HGB 6.2*   < > 6.0* 6.9* 9.9* 9.5* 10.4*  HCT 20.4*   < > 19.1* 22.0* 29.7* 29.2* 32.1*  MCV 106.3*  --  106.7* 100.5*  --  96.4 96.4  PLT 212  --  218 215  --  191 201   < > = values in this interval not displayed.   Cardiac Enzymes: No results for input(s): CKTOTAL, CKMB, CKMBINDEX, TROPONINI in the last 168 hours. BNP: Invalid input(s): POCBNP CBG: Recent Labs  Lab 05/19/19 1241 05/19/19 1637 05/20/19 1059 05/20/19 2110 05/21/19 0801  GLUCAP 99  149* 154* 111* 171*   D-Dimer No results for input(s): DDIMER in the last 72 hours. Hgb A1c No results for input(s): HGBA1C in the last 72 hours. Lipid Profile No results for input(s): CHOL, HDL, LDLCALC, TRIG, CHOLHDL, LDLDIRECT in the last 72 hours. Thyroid function studies No results for input(s): TSH, T4TOTAL, T3FREE, THYROIDAB in the last 72 hours.  Invalid input(s): FREET3 Anemia work up No results for input(s): VITAMINB12, FOLATE, FERRITIN, TIBC, IRON, RETICCTPCT in the last 72 hours. Urinalysis    Component Value Date/Time   COLORURINE YELLOW 08/17/2015 1621   APPEARANCEUR CLEAR 08/17/2015 1621   LABSPEC 1.015 08/17/2015 1621   PHURINE 5.5 08/17/2015 1621   GLUCOSEU NEGATIVE 08/17/2015 1621   HGBUR NEGATIVE 08/17/2015 1621   BILIRUBINUR NEGATIVE 08/17/2015 1621   KETONESUR NEGATIVE 08/17/2015 1621   PROTEINUR 100 (A) 08/17/2015 1621   UROBILINOGEN 0.2 09/05/2014 0013  NITRITE NEGATIVE 08/17/2015 1621   LEUKOCYTESUR NEGATIVE 08/17/2015 1621   Sepsis Labs Invalid input(s): PROCALCITONIN,  WBC,  LACTICIDVEN Microbiology Recent Results (from the past 240 hour(s))  Respiratory Panel by RT PCR (Flu A&B, Covid) - Nasopharyngeal Swab     Status: None   Collection Time: 05/17/19  5:14 PM   Specimen: Nasopharyngeal Swab  Result Value Ref Range Status   SARS Coronavirus 2 by RT PCR NEGATIVE NEGATIVE Final    Comment: (NOTE) SARS-CoV-2 target nucleic acids are NOT DETECTED. The SARS-CoV-2 RNA is generally detectable in upper respiratoy specimens during the acute phase of infection. The lowest concentration of SARS-CoV-2 viral copies this assay can detect is 131 copies/mL. A negative result does not preclude SARS-Cov-2 infection and should not be used as the sole basis for treatment or other patient management decisions. A negative result may occur with  improper specimen collection/handling, submission of specimen other than nasopharyngeal swab, presence of viral  mutation(s) within the areas targeted by this assay, and inadequate number of viral copies (<131 copies/mL). A negative result must be combined with clinical observations, patient history, and epidemiological information. The expected result is Negative. Fact Sheet for Patients:  PinkCheek.be Fact Sheet for Healthcare Providers:  GravelBags.it This test is not yet ap proved or cleared by the [place] FDA and  has been authorized for detection and/or diagnosis of SARS-CoV-2 by FDA under an Emergency Use Authorization (EUA). This EUA will remain  in effect (meaning this test can be used) for the duration of the COVID-19 declaration under Section 564(b)(1) of the Act, 21 U.S.C. section 360bbb-3(b)(1), unless the authorization is terminated or revoked sooner.    Influenza A by PCR NEGATIVE NEGATIVE Final   Influenza B by PCR NEGATIVE NEGATIVE Final    Comment: (NOTE) The Xpert Xpress SARS-CoV-2/FLU/RSV assay is intended as an aid in  the diagnosis of influenza from Nasopharyngeal swab specimens and  should not be used as a sole basis for treatment. Nasal washings and  aspirates are unacceptable for Xpert Xpress SARS-CoV-2/FLU/RSV  testing. Fact Sheet for Patients: PinkCheek.be Fact Sheet for Healthcare Providers: GravelBags.it This test is not yet approved or cleared by the [place] FDA and  has been authorized for detection and/or diagnosis of SARS-CoV-2 by  FDA under an Emergency Use Authorization (EUA). This EUA will remain  in effect (meaning this test can be used) for the duration of the  Covid-19 declaration under Section 564(b)(1) of the Act, 21  U.S.C. section 360bbb-3(b)(1), unless the authorization is  terminated or revoked. Performed at [facility], [address] [place], [place] [address]   MRSA PCR Screening     Status: None    Collection Time: 05/17/19  9:00 PM   Specimen: Nasal Mucosa; Nasopharyngeal  Result Value Ref Range Status   MRSA by PCR NEGATIVE NEGATIVE Final    Comment:        The GeneXpert MRSA Assay (FDA approved for NASAL specimens only), is one component of a comprehensive MRSA colonization surveillance program. It is not intended to diagnose MRSA infection nor to guide or monitor treatment for MRSA infections. Performed at [facility], [place] [address], [place], [place] [address]      Time coordinating discharge: 35 minutes  SIGNED:   Hosie Poisson, MD  [facility]

## 2019-05-23 LAB — SURGICAL PATHOLOGY

## 2019-06-01 ENCOUNTER — Ambulatory Visit: Payer: Medicare PPO | Attending: Internal Medicine

## 2019-06-01 DIAGNOSIS — Z23 Encounter for immunization: Secondary | ICD-10-CM

## 2019-06-01 NOTE — Progress Notes (Signed)
   Covid-19 Vaccination Clinic  Name:  LEANNY MOECKEL    MRN: [id number] DOB: [birth year]  06/01/2019  Ms. Kilts was observed post Covid-19 immunization for 15 minutes without incident. She was provided with Vaccine Information Sheet and instruction to access the V-Safe system.   Ms. Sze was instructed to call 911 with any severe reactions post vaccine: Marland Kitchen Difficulty breathing  . Swelling of face and throat  . A fast heartbeat  . A bad rash all over body  . Dizziness and weakness   Immunizations Administered    Name Date Dose VIS Date Route   Moderna COVID-19 Vaccine 06/01/2019  3:52 PM 0.5 mL 01/2019 Intramuscular   Manufacturer: Moderna   Lot: [id number]   [place]: [id number]

## 2019-07-09 ENCOUNTER — Inpatient Hospital Stay (HOSPITAL_BASED_OUTPATIENT_CLINIC_OR_DEPARTMENT_OTHER)
Admission: EM | Admit: 2019-07-09 | Discharge: 2019-07-11 | DRG: 193 | Disposition: A | Discharge status: Home / Self Care | Payer: Medicare PPO | Attending: Family Medicine | Admitting: Family Medicine

## 2019-07-09 ENCOUNTER — Encounter (HOSPITAL_BASED_OUTPATIENT_CLINIC_OR_DEPARTMENT_OTHER): Payer: Self-pay | Admitting: *Deleted

## 2019-07-09 ENCOUNTER — Emergency Department (HOSPITAL_BASED_OUTPATIENT_CLINIC_OR_DEPARTMENT_OTHER): Payer: Medicare PPO

## 2019-07-09 ENCOUNTER — Other Ambulatory Visit: Payer: Self-pay

## 2019-07-09 DIAGNOSIS — Z888 Allergy status to other drugs, medicaments and biological substances status: Secondary | ICD-10-CM

## 2019-07-09 DIAGNOSIS — E1122 Type 2 diabetes mellitus with diabetic chronic kidney disease: Secondary | ICD-10-CM | POA: Diagnosis present

## 2019-07-09 DIAGNOSIS — J9602 Acute respiratory failure with hypercapnia: Secondary | ICD-10-CM | POA: Diagnosis present

## 2019-07-09 DIAGNOSIS — E669 Obesity, unspecified: Secondary | ICD-10-CM | POA: Diagnosis present

## 2019-07-09 DIAGNOSIS — G4733 Obstructive sleep apnea (adult) (pediatric): Secondary | ICD-10-CM | POA: Diagnosis present

## 2019-07-09 DIAGNOSIS — G4700 Insomnia, unspecified: Secondary | ICD-10-CM | POA: Diagnosis present

## 2019-07-09 DIAGNOSIS — Z885 Allergy status to narcotic agent status: Secondary | ICD-10-CM

## 2019-07-09 DIAGNOSIS — Z992 Dependence on renal dialysis: Secondary | ICD-10-CM

## 2019-07-09 DIAGNOSIS — Z7984 Long term (current) use of oral hypoglycemic drugs: Secondary | ICD-10-CM

## 2019-07-09 DIAGNOSIS — Z91018 Allergy to other foods: Secondary | ICD-10-CM

## 2019-07-09 DIAGNOSIS — N186 End stage renal disease: Secondary | ICD-10-CM | POA: Diagnosis present

## 2019-07-09 DIAGNOSIS — L309 Dermatitis, unspecified: Secondary | ICD-10-CM | POA: Diagnosis present

## 2019-07-09 DIAGNOSIS — Z87892 Personal history of anaphylaxis: Secondary | ICD-10-CM

## 2019-07-09 DIAGNOSIS — J441 Chronic obstructive pulmonary disease with (acute) exacerbation: Secondary | ICD-10-CM | POA: Diagnosis present

## 2019-07-09 DIAGNOSIS — F419 Anxiety disorder, unspecified: Secondary | ICD-10-CM | POA: Diagnosis present

## 2019-07-09 DIAGNOSIS — J189 Pneumonia, unspecified organism: Secondary | ICD-10-CM

## 2019-07-09 DIAGNOSIS — I120 Hypertensive chronic kidney disease with stage 5 chronic kidney disease or end stage renal disease: Secondary | ICD-10-CM | POA: Diagnosis present

## 2019-07-09 DIAGNOSIS — Z8701 Personal history of pneumonia (recurrent): Secondary | ICD-10-CM

## 2019-07-09 DIAGNOSIS — D631 Anemia in chronic kidney disease: Secondary | ICD-10-CM | POA: Diagnosis present

## 2019-07-09 DIAGNOSIS — J9601 Acute respiratory failure with hypoxia: Secondary | ICD-10-CM | POA: Diagnosis not present

## 2019-07-09 DIAGNOSIS — F17210 Nicotine dependence, cigarettes, uncomplicated: Secondary | ICD-10-CM | POA: Diagnosis present

## 2019-07-09 DIAGNOSIS — Z833 Family history of diabetes mellitus: Secondary | ICD-10-CM

## 2019-07-09 DIAGNOSIS — Z825 Family history of asthma and other chronic lower respiratory diseases: Secondary | ICD-10-CM

## 2019-07-09 DIAGNOSIS — J449 Chronic obstructive pulmonary disease, unspecified: Secondary | ICD-10-CM | POA: Diagnosis present

## 2019-07-09 DIAGNOSIS — Z8249 Family history of ischemic heart disease and other diseases of the circulatory system: Secondary | ICD-10-CM

## 2019-07-09 DIAGNOSIS — Z6829 Body mass index (BMI) 29.0-29.9, adult: Secondary | ICD-10-CM

## 2019-07-09 DIAGNOSIS — E119 Type 2 diabetes mellitus without complications: Secondary | ICD-10-CM

## 2019-07-09 DIAGNOSIS — I10 Essential (primary) hypertension: Secondary | ICD-10-CM | POA: Diagnosis present

## 2019-07-09 DIAGNOSIS — G8929 Other chronic pain: Secondary | ICD-10-CM | POA: Diagnosis present

## 2019-07-09 DIAGNOSIS — Z20822 Contact with and (suspected) exposure to covid-19: Secondary | ICD-10-CM | POA: Diagnosis present

## 2019-07-09 DIAGNOSIS — F5104 Psychophysiologic insomnia: Secondary | ICD-10-CM | POA: Diagnosis present

## 2019-07-09 DIAGNOSIS — J440 Chronic obstructive pulmonary disease with acute lower respiratory infection: Secondary | ICD-10-CM | POA: Diagnosis present

## 2019-07-09 DIAGNOSIS — K219 Gastro-esophageal reflux disease without esophagitis: Secondary | ICD-10-CM | POA: Diagnosis present

## 2019-07-09 DIAGNOSIS — J810 Acute pulmonary edema: Secondary | ICD-10-CM | POA: Diagnosis present

## 2019-07-09 DIAGNOSIS — Z7952 Long term (current) use of systemic steroids: Secondary | ICD-10-CM

## 2019-07-09 DIAGNOSIS — M549 Dorsalgia, unspecified: Secondary | ICD-10-CM | POA: Diagnosis present

## 2019-07-09 DIAGNOSIS — Z7951 Long term (current) use of inhaled steroids: Secondary | ICD-10-CM

## 2019-07-09 DIAGNOSIS — M898X9 Other specified disorders of bone, unspecified site: Secondary | ICD-10-CM | POA: Diagnosis present

## 2019-07-09 DIAGNOSIS — L4050 Arthropathic psoriasis, unspecified: Secondary | ICD-10-CM | POA: Diagnosis present

## 2019-07-09 DIAGNOSIS — M1990 Unspecified osteoarthritis, unspecified site: Secondary | ICD-10-CM | POA: Diagnosis present

## 2019-07-09 DIAGNOSIS — Z981 Arthrodesis status: Secondary | ICD-10-CM

## 2019-07-09 DIAGNOSIS — Z79899 Other long term (current) drug therapy: Secondary | ICD-10-CM

## 2019-07-09 LAB — CBC WITH DIFFERENTIAL/PLATELET
Abs Immature Granulocytes: 0.03 10*3/uL (ref 0.00–0.07)
Basophils Absolute: 0 10*3/uL (ref 0.0–0.1)
Basophils Relative: 0 %
Eosinophils Absolute: 0.1 10*3/uL (ref 0.0–0.5)
Eosinophils Relative: 1 %
HCT: 36.1 % (ref 36.0–46.0)
Hemoglobin: 11.5 g/dL — ABNORMAL LOW (ref 12.0–15.0)
Immature Granulocytes: 1 %
Lymphocytes Relative: 10 %
Lymphs Abs: 0.6 10*3/uL — ABNORMAL LOW (ref 0.7–4.0)
MCH: 31.7 pg (ref 26.0–34.0)
MCHC: 31.9 g/dL (ref 30.0–36.0)
MCV: 99.4 fL (ref 80.0–100.0)
Monocytes Absolute: 0.7 10*3/uL (ref 0.1–1.0)
Monocytes Relative: 12 %
Neutro Abs: 4.5 10*3/uL (ref 1.7–7.7)
Neutrophils Relative %: 76 %
Platelets: 223 10*3/uL (ref 150–400)
RBC: 3.63 MIL/uL — ABNORMAL LOW (ref 3.87–5.11)
RDW: 17.2 % — ABNORMAL HIGH (ref 11.5–15.5)
WBC: 6 10*3/uL (ref 4.0–10.5)
nRBC: 0 % (ref 0.0–0.2)

## 2019-07-09 LAB — COMPREHENSIVE METABOLIC PANEL
ALT: 12 U/L (ref 0–44)
AST: 17 U/L (ref 15–41)
Albumin: 3.7 g/dL (ref 3.5–5.0)
Alkaline Phosphatase: 94 U/L (ref 38–126)
Anion gap: 14 (ref 5–15)
BUN: 26 mg/dL — ABNORMAL HIGH (ref 6–20)
CO2: 26 mmol/L (ref 22–32)
Calcium: 9.3 mg/dL (ref 8.9–10.3)
Chloride: 94 mmol/L — ABNORMAL LOW (ref 98–111)
Creatinine, Ser: 8.32 mg/dL — ABNORMAL HIGH (ref 0.44–1.00)
GFR calc Af Amer: 6 mL/min — ABNORMAL LOW (ref >60)
GFR calc non Af Amer: 5 mL/min — ABNORMAL LOW (ref >60)
Glucose, Bld: 89 mg/dL (ref 70–99)
Potassium: 4.5 mmol/L (ref 3.5–5.1)
Sodium: 134 mmol/L — ABNORMAL LOW (ref 135–145)
Total Bilirubin: 1 mg/dL (ref 0.3–1.2)
Total Protein: 7.3 g/dL (ref 6.5–8.1)

## 2019-07-09 LAB — SARS CORONAVIRUS 2 BY RT PCR (HOSPITAL ORDER, PERFORMED IN ~~LOC~~ HOSPITAL LAB): SARS Coronavirus 2: NEGATIVE

## 2019-07-09 LAB — PROTIME-INR
INR: 1.1 (ref 0.8–1.2)
Prothrombin Time: 13.6 seconds (ref 11.4–15.2)

## 2019-07-09 LAB — LACTIC ACID, PLASMA: Lactic Acid, Venous: 0.7 mmol/L (ref 0.5–1.9)

## 2019-07-09 LAB — APTT: aPTT: 37 seconds — ABNORMAL HIGH (ref 24–36)

## 2019-07-09 LAB — BRAIN NATRIURETIC PEPTIDE: B Natriuretic Peptide: 775.5 pg/mL — ABNORMAL HIGH (ref 0.0–100.0)

## 2019-07-09 MED ORDER — ALBUTEROL SULFATE HFA 108 (90 BASE) MCG/ACT IN AERS
6.0000 | INHALATION_SPRAY | Freq: Once | RESPIRATORY_TRACT | Status: AC
  Administered 2019-07-09: 6 via RESPIRATORY_TRACT
  Filled 2019-07-09: qty 6.7

## 2019-07-09 MED ORDER — SODIUM CHLORIDE 0.9 % IV SOLN
500.0000 mg | Freq: Once | INTRAVENOUS | Status: AC
  Administered 2019-07-09: 500 mg via INTRAVENOUS
  Filled 2019-07-09: qty 500

## 2019-07-09 MED ORDER — SODIUM CHLORIDE 0.9 % IV SOLN: 2.0000 g | INTRAVENOUS | Status: DC

## 2019-07-09 MED ORDER — ACETAMINOPHEN 500 MG PO TABS
1000.0000 mg | ORAL_TABLET | Freq: Once | ORAL | Status: AC
  Administered 2019-07-09: 1000 mg via ORAL
  Filled 2019-07-09: qty 2

## 2019-07-09 MED ORDER — SODIUM CHLORIDE 0.9 % IV SOLN
INTRAVENOUS | Status: DC | PRN
  Administered 2019-07-09: 250 mL via INTRAVENOUS

## 2019-07-09 MED ORDER — VANCOMYCIN HCL IN DEXTROSE 750-5 MG/150ML-% IV SOLN
750.0000 mg | INTRAVENOUS | Status: DC
  Filled 2019-07-09: qty 150

## 2019-07-09 MED ORDER — AEROCHAMBER PLUS FLO-VU MISC
1.0000 | Freq: Once | Status: AC
  Administered 2019-07-09: 1
  Filled 2019-07-09: qty 1

## 2019-07-09 MED ORDER — VANCOMYCIN HCL IN DEXTROSE 1-5 GM/200ML-% IV SOLN
1000.0000 mg | Freq: Once | INTRAVENOUS | Status: AC
  Administered 2019-07-09: 1000 mg via INTRAVENOUS
  Filled 2019-07-09: qty 200

## 2019-07-09 MED ORDER — SODIUM CHLORIDE 0.9 % IV SOLN
2.0000 g | Freq: Once | INTRAVENOUS | Status: AC
  Administered 2019-07-09: 2 g via INTRAVENOUS
  Filled 2019-07-09: qty 2

## 2019-07-09 NOTE — Progress Notes (Signed)
Pharmacy Antibiotic Note  Jodi Bell is a 59 y.o. female admitted on 07/09/2019 with pneumonia.  Pharmacy has been consulted for vancomycin and cefepime dosing. Pt is febrile with Tmax 101.2. Labs are pending. Pt with history of ESRD on HD.   Plan: Vancomycin 1gm (only dose available in [facility] pyxis) then 750mg  post-HD Cefepime 2gm IV x 1 then 2gm QHD F/u renal plans, C&S, clinical status and pre-HD vanc level PRN  Height: 5\' 2"  (157.5 cm) Weight: 72.1 kg (159 lb) IBW/kg (Calculated) : 50.1  Temp (24hrs), Avg:100.4 F (38 C), Min:99.5 F (37.5 C), Max:101.2 F (38.4 C)  No results for input(s): WBC, CREATININE, LATICACIDVEN, VANCOTROUGH, VANCOPEAK, VANCORANDOM, GENTTROUGH, GENTPEAK, GENTRANDOM, TOBRATROUGH, TOBRAPEAK, TOBRARND, AMIKACINPEAK, AMIKACINTROU, AMIKACIN in the last 168 hours.  CrCl cannot be calculated (Patient's most recent lab result is older than the maximum 21 days allowed.).    Allergies  Allergen Reactions  . Banana Anaphylaxis and Swelling    TONGUE AND MOUTH SWELLING  . Black Walnut Flavor Anaphylaxis    Walnuts  . Hazelnut ([place]) Allergy Skin Test Anaphylaxis    Hazelnuts  . No Healthtouch Food Allergies Anaphylaxis    Spicy Mustard 4.7.2021 Pt reports that she eats Regular Yellow Mustard  . Pecan Nut (Diagnostic) Anaphylaxis    Pecans  . Adalimumab Other (See Comments)    Blisters on abdomen =humira  . Ezetimibe Other (See Comments)    myalgias  . Ibuprofen Other (See Comments)    Renal Problems  . Leflunomide And Related Other (See Comments)    Severe Headache  . Lexapro [Escitalopram Oxalate] Other (See Comments)    Hand problems - Serotonin Syndrome   . Lisinopril Swelling    Angioedema   . Secukinumab     Cosentyx = angiodema  . Statins Other (See Comments)    Leg pains and weakness  . Trazodone And Nefazodone Other (See Comments)    Serotonin Syndrome     Antimicrobials this admission: Vanc 5/29>> Cefepime 5/29>> Azithro x 1  5/29  Dose adjustments this admission: N/A  Microbiology results: Pending  Thank you for allowing pharmacy to be a part of this patient's care.  Cyncere Ruhe, Rande Lawman 07/09/2019 8:58 PM

## 2019-07-09 NOTE — ED Provider Notes (Signed)
[facility] EMERGENCY DEPARTMENT Provider Note   CSN: [id number] Arrival date & time: 07/09/19  1930     History Chief Complaint  Patient presents with  . Cough    Jodi Bell is a 59 y.o. female.  Jodi Bell is a 59 y.o. female with a history of ESRD on HD, hypertension, asthma, GERD, COPD, diabetes, sleep apnea, who presents to the emergency department for evaluation of fevers, cough and shortness of breath.  She reports symptoms started a few days ago with progressively worsening cough.  She reports she feels that her chest is congested but she feels like she cannot get it off with coughing.  She reports that she continued to feel progressively worse and this morning woke up with a fever up to 101 at home, no meds prior to arrival to treat fever.  She states that she has felt more fatigued with some generalized body aches and poor appetite over the past few days, today feels more short of breath with exertion but denies associated chest pain.  She has had some congestion, denies sore throat.  Denies vomiting, diarrhea or abdominal pain.  Only makes a small amount of urine daily, denies any dysuria.  Reports she had a full dialysis treatment yesterday where they took off about 6 pounds and she was just below her dry weight after treatment.  She reports that she has not had much swelling in her legs recently.  States that she had both of her Covid vaccines, and that all of her family members have been vaccinated as well.  Her ex-husband who she is around frequently, was just hospitalized for a few weeks with bilateral pneumonia, but did not have Covid.  Also reports that her grandchild was recently sick over the past week and had coughed in her face a few times.  No other known sick contacts.        Past Medical History:  Diagnosis Date  . Anemia   . Arthritis    oa and psoriatic ra  . Asthma   . Back pain, chronic    lower back  . Candida infection finished tx 2  days ago  . Chronic insomnia   . Chronic kidney disease    stage 3 ckd last ov nephrology 04-29-17 epic  . Chronic kidney disease (CKD) stage G4/A1, severely decreased glomerular filtration rate (GFR) between 15-29 mL/min/1.73 square meter and albuminuria creatinine ratio less than 30 mg/g (HCC)   . COPD (chronic obstructive pulmonary disease) ([place])   . Diabetes mellitus    type 2  . Eczema   . Essential hypertension   . GERD (gastroesophageal reflux disease)   . History of hiatal hernia   . Obesity   . Pneumonia few yrs ago x 2  . Sleep apnea     Patient Active Problem List   Diagnosis Date Noted  . GI bleeding 05/17/2019  . Volume overload state of heart 09/14/2018  . Intervertebral cervical disc disorder with myelopathy, cervical region 09/14/2018  . OSA (obstructive sleep apnea) 09/14/2018  . Asthma 05/16/2018  . Suspected COVID-19 virus infection 05/16/2018  . ESRD (end stage renal disease) ([place]) 05/16/2018  . Fluid overload 02/27/2018  . Chest pain, rule out acute myocardial infarction 12/12/2017  . Status post left partial knee replacement 11/12/2017  . Hematochezia 09/20/2017  . GERD (gastroesophageal reflux disease) 09/20/2017  . 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 2 diabetes mellitus ([place]) 09/20/2017  . COPD (chronic obstructive pulmonary disease) ([place])  09/20/2017  . Tobacco use 09/20/2017  . Symptomatic anemia 09/20/2017  . Headache 09/20/2017  . Serotonin syndrome 09/28/2013  . Acute respiratory failure with hypoxia ([place]) 09/28/2013  . Encephalopathy acute 09/28/2013  . Hypotension, unspecified 09/28/2013  . 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 Type 2 diabetes mellitus ([place]) 03/06/2008  . INSOMNIA, CHRONIC 03/06/2008  . Essential hypertension 03/06/2008  . ASTHMA 03/06/2008  . ECZEMA 03/06/2008  . BACK PAIN, LUMBAR, CHRONIC 03/06/2008    Past Surgical History:  Procedure Laterality Date  . BACK SURGERY  2016   lower back fusion with cage  . BIOPSY  09/23/2017    Procedure: BIOPSY;  Surgeon: Wonda Horner, MD;  Location: [facility];  Service: Endoscopy;;  . BIOPSY  05/19/2019   Procedure: BIOPSY;  Surgeon: Otis Brace, MD;  Location: [facility];  Service: Gastroenterology;;  . CESAREAN SECTION  1990   x 1   . COLONOSCOPY WITH PROPOFOL N/A 09/23/2017   Procedure: COLONOSCOPY WITH PROPOFOL Hemostatic clips placed;  Surgeon: Wonda Horner, MD;  Location: [facility];  Service: Endoscopy;  Laterality: N/A;  . DILATION AND CURETTAGE OF UTERUS  1988  . ESOPHAGOGASTRODUODENOSCOPY (EGD) WITH PROPOFOL N/A 09/23/2017   Procedure: ESOPHAGOGASTRODUODENOSCOPY (EGD) WITH PROPOFOL;  Surgeon: Wonda Horner, MD;  Location: [facility];  Service: Endoscopy;  Laterality: N/A;  . ESOPHAGOGASTRODUODENOSCOPY (EGD) WITH PROPOFOL N/A 05/19/2019   Procedure: ESOPHAGOGASTRODUODENOSCOPY (EGD) WITH PROPOFOL;  Surgeon: Otis Brace, MD;  Location: [facility];  Service: Gastroenterology;  Laterality: N/A;  . GIVENS CAPSULE STUDY N/A 05/19/2019   Procedure: GIVENS CAPSULE STUDY;  Surgeon: Otis Brace, MD;  Location: [facility];  Service: Gastroenterology;  Laterality: N/A;  . HOT HEMOSTASIS N/A 05/19/2019   Procedure: HOT HEMOSTASIS (ARGON PLASMA COAGULATION/BICAP);  Surgeon: Otis Brace, MD;  Location: [facility];  Service: Gastroenterology;  Laterality: N/A;  . PARTIAL KNEE ARTHROPLASTY Left 11/12/2017   Procedure: left unicompartmental arthroplasty-medial;  Surgeon: Paralee Cancel, MD;  Location: [facility];  Service: Orthopedics;  Laterality: Left;  39min  . SUBMUCOSAL INJECTION  09/23/2017   Procedure: SUBMUCOSAL INJECTION;  Surgeon: Wonda Horner, MD;  Location: [facility];  Service: Endoscopy;;  in colon  . TUBAL LIGATION  1990     OB History   No obstetric history on file.     Family History  Problem Relation Age of Onset  . Lung cancer Mother   . Diabetes Sister   . Heart disease Sister   . Asthma Daughter   . Arthritis Daughter   .  Arthritis Daughter   . Thyroid cancer Other        1/2 sister  . Heart disease Other        1/2 sister    Social History   Tobacco Use  . Smoking status: Current Some Day Smoker    Packs/day: 0.50    Years: 40.00    Pack years: 20.00    Types: Cigarettes    Last attempt to quit: 05/15/2018    Years since quitting: 1.1  . Smokeless tobacco: Never Used  Substance Use Topics  . Alcohol use: Yes    Comment: occ  . Drug use: No    Home Medications Prior to Admission medications   Medication Sig Start Date End Date Taking? Authorizing Provider  acetaminophen (TYLENOL) 500 MG tablet Take 1,000 mg by mouth every 6 (six) hours as needed for mild pain.    [provider]  allopurinol (ZYLOPRIM) 100 MG tablet Take 100 mg by mouth every  evening.    [provider]  amLODipine (NORVASC) 10 MG tablet Take 10 mg by mouth daily.    [provider]  budesonide-formoterol (SYMBICORT) 160-4.5 MCG/ACT inhaler Inhale 2 puffs into the lungs 2 (two) times daily.    [provider]  calcium acetate (PHOSLO) 667 MG capsule Take 3 capsules by mouth in the morning and at bedtime. 10/25/18   [provider]  carboxymethylcellulose 1 % ophthalmic solution Place 1 drop into both eyes 3 (three) times daily.    [provider]  cetirizine (ZYRTEC) 10 MG tablet Take 10 mg by mouth 2 (two) times daily as needed (hives).  06/13/17   Shelda Pal, DO  cinacalcet (SENSIPAR) 30 MG tablet Take 30 mg by mouth daily.    [provider]  cloNIDine (CATAPRES) 0.3 MG tablet Take 1 tablet (0.3 mg total) by mouth 3 (three) times daily. 05/21/19   Hosie Poisson, MD  ELDERBERRY PO Take 2 drops by mouth daily.    [provider]  EPINEPHrine (EPIPEN) 0.3 mg/0.3 mL SOAJ injection Inject 0.3 mLs (0.3 mg total) into the muscle once. Patient taking differently: Inject 0.3 mg into the muscle as needed (for allergic reaction).  09/30/12   Le, Thao P, DO   esomeprazole (NEXIUM) 20 MG capsule Take 2 capsules (40 mg total) by mouth daily. 05/21/19   Hosie Poisson, MD  ferrous sulfate 325 (65 FE) MG tablet Take 325 mg by mouth 3 (three) times a week. TUE THUR SAT    [provider]  fluticasone (FLONASE) 50 MCG/ACT nasal spray Place 1 spray into both nostrils daily.     [provider]  folic acid (FOLVITE) 885 MCG tablet Take 400 mcg by mouth daily.     [provider]  furosemide (LASIX) 40 MG tablet Take 40 mg by mouth 2 (two) times daily.     [provider]  gabapentin (NEURONTIN) 300 MG capsule Take 1 capsule (300 mg total) by mouth at bedtime as needed (Neuropathic pain). Patient taking differently: Take 300 mg by mouth daily with supper.  12/14/17 05/18/19  Dana Allan I, MD  hydrALAZINE (APRESOLINE) 50 MG tablet Take 1 tablet (50 mg total) by mouth every 8 (eight) hours. Patient taking differently: Take 100 mg by mouth every 8 (eight) hours.  12/14/17   Bonnell Public, MD  isosorbide mononitrate (IMDUR) 60 MG 24 hr tablet Take 1 tablet (60 mg total) by mouth daily. 03/02/18   Thurnell Lose, MD  JANUVIA 25 MG tablet Take 25 mg by mouth daily.  08/09/15   [provider]  labetalol (NORMODYNE) 200 MG tablet Take 400 mg by mouth 3 (three) times daily.    [provider]  LORazepam (ATIVAN) 0.5 MG tablet Take 0.5 mg by mouth every 8 (eight) hours as needed for anxiety. 05/02/19   [provider]  methocarbamol (ROBAXIN) 500 MG tablet Take 500 mg by mouth 4 (four) times daily as needed for muscle pain. neck 05/01/19   [provider]  montelukast (SINGULAIR) 10 MG tablet Take 10 mg by mouth daily with supper.     [provider]  multivitamin (RENA-VIT) TABS tablet Take 1 tablet by mouth daily with supper.    [provider]  olmesartan (BENICAR) 40 MG tablet Take 40 mg by mouth daily. 05/06/19   [provider]  Omega-3 1000 MG CAPS Take 1,000  mg by mouth in the morning, at noon, and at bedtime.  [provider]  ondansetron (ZOFRAN) 4 MG tablet Take 1 tablet (4 mg total) by mouth every 8 (eight) hours as needed for nausea or vomiting. 05/21/19 05/20/20  Hosie Poisson, MD  Oxycodone HCl 10 MG TABS Take 1 tablet (10 mg total) by mouth every 6 (six) hours as needed (pain score 7-10). 09/15/18   Black, Lezlie Octave, NP  predniSONE (DELTASONE) 5 MG tablet Take 5 mg by mouth daily with breakfast.     [provider]  Probiotic Product (PROBIOTIC PO) Take 1 capsule by mouth daily.    [provider]  senna (SENOKOT) 8.6 MG tablet Take 1-2 tablets by mouth daily as needed for constipation.    [provider]  Tiotropium Bromide Monohydrate (SPIRIVA RESPIMAT IN) Inhale 2 puffs into the lungs daily.    [provider]  VENTOLIN HFA 108 (90 Base) MCG/ACT inhaler Inhale 1-2 puffs into the lungs every 6 (six) hours as needed for shortness of breath. wheezing 06/13/15   [provider]  XELJANZ 5 MG TABS Take 1 tablet by mouth at bedtime. 05/17/19   [provider]  zolpidem (AMBIEN) 5 MG tablet Take 5 mg by mouth at bedtime. 05/05/19   [provider]    Allergies    Banana, Black walnut flavor, Hazelnut (filbert) allergy skin test, No healthtouch food allergies, Pecan nut (diagnostic), Adalimumab, Ezetimibe, Ibuprofen, Leflunomide and related, Lexapro [escitalopram oxalate], Lisinopril, Secukinumab, Statins, and Trazodone and nefazodone  Review of Systems   Review of Systems  Constitutional: Positive for appetite change, chills and fever.  HENT: Positive for congestion and rhinorrhea. Negative for sore throat.   Respiratory: Positive for cough, shortness of breath and wheezing.   Cardiovascular: Negative for chest pain.  Gastrointestinal: Negative for abdominal pain, diarrhea, nausea and vomiting.  Genitourinary: Negative for dysuria and frequency.  Musculoskeletal: Positive for  arthralgias and myalgias.  Skin: Negative for color change and rash.  Neurological: Negative for dizziness, syncope and light-headedness.  All other systems reviewed and are negative.   Physical Exam Updated Vital Signs BP (!) 180/74 (BP Location: Right Arm)   Pulse 85   Temp (!) 101.2 F (38.4 C) (Oral)   Resp (!) 24   Ht 5\' 2"  (1.575 m)   Wt 72.1 kg   SpO2 96%   BMI 29.08 kg/m   Physical Exam Vitals and nursing note reviewed.  Constitutional:      General: She is not in acute distress.    Appearance: She is well-developed. She is ill-appearing. She is not diaphoretic.     Comments: Patient is ill-appearing but not in acute distress  HENT:     Head: Normocephalic and atraumatic.     Nose: Rhinorrhea present.     Mouth/Throat:     Mouth: Mucous membranes are moist.     Pharynx: Oropharynx is clear.  Eyes:     General:        Right eye: No discharge.        Left eye: No discharge.  Cardiovascular:     Rate and Rhythm: Normal rate and regular rhythm.     Heart sounds: Normal heart sounds. No murmur. No friction rub. No gallop.   Pulmonary:     Effort: No respiratory distress.     Breath sounds: Wheezing, rhonchi and rales present.     Comments: Patient tachypneic with mildly increased respiratory effort, satting in the low 90s on room air, with intermittent drops into the upper 80s, improved on 2 L  nasal cannula.  On exam she has diffuse wheezes, rales and rhonchi throughout bilateral lung fields, with diminished air movement in the bases. Abdominal:     General: Bowel sounds are normal. There is no distension.     Palpations: Abdomen is soft. There is no mass.     Tenderness: There is no abdominal tenderness. There is no guarding.     Comments: Abdomen soft, nondistended, nontender to palpation in all quadrants without guarding or peritoneal signs  Musculoskeletal:        General: No deformity.     Cervical back: Neck supple.     Right lower leg: No edema.     Left  lower leg: No edema.  Skin:    General: Skin is warm and dry.     Capillary Refill: Capillary refill takes less than 2 seconds.  Neurological:     Mental Status: She is alert and oriented to person, place, and time.     Coordination: Coordination normal.     Comments: Speech is clear, able to follow commands Moves extremities without ataxia, coordination intact  Psychiatric:        Mood and Affect: Mood normal.        Behavior: Behavior normal.     ED Results / Procedures / Treatments   Labs (all labs ordered are listed, but only abnormal results are displayed) Labs Reviewed  SARS CORONAVIRUS 2 BY RT PCR (HOSPITAL ORDER, [place] [facility])    EKG None  Radiology No results found.  Procedures Procedures (including critical care time)  Medications Ordered in ED Medications  acetaminophen (TYLENOL) tablet 1,000 mg (has no administration in time range)    ED Course  I have reviewed the triage vital signs and the nursing notes.  Pertinent labs & imaging results that were available during my care of the patient were reviewed by me and considered in my medical decision making (see chart for details).   MDM Rules/Calculators/A&P                     59 year old female presents with fevers, cough, shortness of breath.  On arrival patient is ill-appearing, febrile, lungs with diffuse, wheezes, rales and rhonchi, with new 2 L oxygen requirement.  Patient is on dialysis and had full treatment yesterday, clinically does not appear fluid overloaded.  High suspicion for pneumonia, also concern for potential Covid or other viral etiology.  Patient has had both Covid vaccines.  Will get sepsis labs.  Patient is febrile with oxygen requirement but does not have signs of severe sepsis is not tachycardic or hypotensive, feel that 30 cc/kg fluid bolus will do more harm than good at this point we will continue to monitor patient closely.  Chest x-ray with cardiomegaly and  possible mild vascular congestion, feel this could also represent a more apical pneumonia given clinical picture.  Patient started on broad-spectrum antibiotics given her high exposure to healthcare setting she is at high risk for potential MRSA or Pseudomonas colonization.  She is also been given bronchodilators given wheezing on exam.  Remained stable on 2 L nasal cannula.  I have independently ordered, reviewed and interpreted all labs and imaging: Covid test is negative.  Patient without leukocytosis, hemoglobin is stable.  No significant electrolyte derangements, specifically potassium is normal after recent dialysis treatment.  Lactic acid is not elevated.  BNP at 775.5 which appears to be patient's baseline with renal failure.  Given new oxygen requirement with high  clinical suspicion for pneumonia patient will require hospitalization.  Case discussed with Dr. Posey Pronto with [facility] hospitalist who accepts the patient for admission at [facility].  I discussed with Dr. Joylene Grapes with nephrology who requests that the morning team be called to set patient up for dialysis during her hospital admission.  Final Clinical Impression(s) / ED Diagnoses Final diagnoses:  Acute respiratory failure with hypoxia ([place])  Pneumonia of both lungs due to infectious organism, unspecified part of lung    Rx / DC Orders ED Discharge Orders    None       Janet Berlin 07/10/19 1229    Malvin Johns, MD 07/14/19 1505

## 2019-07-09 NOTE — ED Triage Notes (Signed)
Pt reports productive cough x 3 days. States she received her 2nd covid vaccine in April. She had dialysis yesterday. States using her inhalers with some relief

## 2019-07-10 ENCOUNTER — Encounter (HOSPITAL_COMMUNITY): Payer: Self-pay | Admitting: Internal Medicine

## 2019-07-10 ENCOUNTER — Other Ambulatory Visit: Payer: Self-pay

## 2019-07-10 DIAGNOSIS — F17210 Nicotine dependence, cigarettes, uncomplicated: Secondary | ICD-10-CM | POA: Diagnosis present

## 2019-07-10 DIAGNOSIS — J449 Chronic obstructive pulmonary disease, unspecified: Secondary | ICD-10-CM

## 2019-07-10 DIAGNOSIS — F5104 Psychophysiologic insomnia: Secondary | ICD-10-CM | POA: Diagnosis present

## 2019-07-10 DIAGNOSIS — J810 Acute pulmonary edema: Secondary | ICD-10-CM | POA: Diagnosis present

## 2019-07-10 DIAGNOSIS — J441 Chronic obstructive pulmonary disease with (acute) exacerbation: Secondary | ICD-10-CM | POA: Diagnosis present

## 2019-07-10 DIAGNOSIS — I120 Hypertensive chronic kidney disease with stage 5 chronic kidney disease or end stage renal disease: Secondary | ICD-10-CM | POA: Diagnosis present

## 2019-07-10 DIAGNOSIS — N186 End stage renal disease: Secondary | ICD-10-CM

## 2019-07-10 DIAGNOSIS — M898X9 Other specified disorders of bone, unspecified site: Secondary | ICD-10-CM | POA: Diagnosis present

## 2019-07-10 DIAGNOSIS — G4700 Insomnia, unspecified: Secondary | ICD-10-CM

## 2019-07-10 DIAGNOSIS — J189 Pneumonia, unspecified organism: Secondary | ICD-10-CM | POA: Diagnosis present

## 2019-07-10 DIAGNOSIS — Z992 Dependence on renal dialysis: Secondary | ICD-10-CM | POA: Diagnosis not present

## 2019-07-10 DIAGNOSIS — Z6829 Body mass index (BMI) 29.0-29.9, adult: Secondary | ICD-10-CM | POA: Diagnosis not present

## 2019-07-10 DIAGNOSIS — J440 Chronic obstructive pulmonary disease with acute lower respiratory infection: Secondary | ICD-10-CM | POA: Diagnosis present

## 2019-07-10 DIAGNOSIS — L309 Dermatitis, unspecified: Secondary | ICD-10-CM | POA: Diagnosis present

## 2019-07-10 DIAGNOSIS — D631 Anemia in chronic kidney disease: Secondary | ICD-10-CM | POA: Diagnosis present

## 2019-07-10 DIAGNOSIS — L4050 Arthropathic psoriasis, unspecified: Secondary | ICD-10-CM | POA: Diagnosis present

## 2019-07-10 DIAGNOSIS — K219 Gastro-esophageal reflux disease without esophagitis: Secondary | ICD-10-CM | POA: Diagnosis present

## 2019-07-10 DIAGNOSIS — G8929 Other chronic pain: Secondary | ICD-10-CM | POA: Diagnosis present

## 2019-07-10 DIAGNOSIS — E1122 Type 2 diabetes mellitus with diabetic chronic kidney disease: Secondary | ICD-10-CM | POA: Diagnosis present

## 2019-07-10 DIAGNOSIS — M549 Dorsalgia, unspecified: Secondary | ICD-10-CM | POA: Diagnosis present

## 2019-07-10 DIAGNOSIS — J9601 Acute respiratory failure with hypoxia: Secondary | ICD-10-CM

## 2019-07-10 DIAGNOSIS — Z20822 Contact with and (suspected) exposure to covid-19: Secondary | ICD-10-CM | POA: Diagnosis present

## 2019-07-10 DIAGNOSIS — M1990 Unspecified osteoarthritis, unspecified site: Secondary | ICD-10-CM | POA: Diagnosis present

## 2019-07-10 DIAGNOSIS — F419 Anxiety disorder, unspecified: Secondary | ICD-10-CM | POA: Diagnosis present

## 2019-07-10 DIAGNOSIS — E669 Obesity, unspecified: Secondary | ICD-10-CM | POA: Diagnosis present

## 2019-07-10 DIAGNOSIS — G4733 Obstructive sleep apnea (adult) (pediatric): Secondary | ICD-10-CM

## 2019-07-10 DIAGNOSIS — L4052 Psoriatic arthritis mutilans: Secondary | ICD-10-CM

## 2019-07-10 LAB — GLUCOSE, CAPILLARY
Glucose-Capillary: 103 mg/dL — ABNORMAL HIGH (ref 70–99)
Glucose-Capillary: 77 mg/dL (ref 70–99)
Glucose-Capillary: 85 mg/dL (ref 70–99)
Glucose-Capillary: 151 mg/dL — ABNORMAL HIGH (ref 70–99)
Glucose-Capillary: 223 mg/dL — ABNORMAL HIGH (ref 70–99)

## 2019-07-10 LAB — PROCALCITONIN: Procalcitonin: 0.31 ng/mL

## 2019-07-10 LAB — CBG MONITORING, ED: Glucose-Capillary: 107 mg/dL — ABNORMAL HIGH (ref 70–99)

## 2019-07-10 MED ORDER — HYDRALAZINE HCL 50 MG PO TABS
100.0000 mg | ORAL_TABLET | Freq: Three times a day (TID) | ORAL | Status: DC
  Administered 2019-07-10 – 2019-07-11 (×4): 100 mg via ORAL
  Filled 2019-07-10 (×5): qty 2

## 2019-07-10 MED ORDER — CALCIUM ACETATE (PHOS BINDER) 667 MG PO CAPS
2001.0000 mg | ORAL_CAPSULE | Freq: Three times a day (TID) | ORAL | Status: DC
  Administered 2019-07-10 – 2019-07-11 (×4): 2001 mg via ORAL
  Filled 2019-07-10 (×4): qty 3

## 2019-07-10 MED ORDER — PREDNISONE 5 MG PO TABS
5.0000 mg | ORAL_TABLET | Freq: Every day | ORAL | Status: DC
  Administered 2019-07-10 – 2019-07-11 (×2): 5 mg via ORAL
  Filled 2019-07-10 (×2): qty 1

## 2019-07-10 MED ORDER — ALBUTEROL SULFATE (2.5 MG/3ML) 0.083% IN NEBU: 2.5000 mg | INHALATION_SOLUTION | Freq: Four times a day (QID) | RESPIRATORY_TRACT | Status: DC | PRN

## 2019-07-10 MED ORDER — ACETAMINOPHEN 500 MG PO TABS: 1000.0000 mg | ORAL_TABLET | Freq: Four times a day (QID) | ORAL | Status: DC | PRN

## 2019-07-10 MED ORDER — UMECLIDINIUM BROMIDE 62.5 MCG/INH IN AEPB
1.0000 | INHALATION_SPRAY | Freq: Every day | RESPIRATORY_TRACT | Status: DC
  Administered 2019-07-10: 1 via RESPIRATORY_TRACT
  Filled 2019-07-10: qty 7

## 2019-07-10 MED ORDER — FUROSEMIDE 40 MG PO TABS
40.0000 mg | ORAL_TABLET | Freq: Two times a day (BID) | ORAL | Status: DC
  Administered 2019-07-10 – 2019-07-11 (×3): 40 mg via ORAL
  Filled 2019-07-10 (×3): qty 1

## 2019-07-10 MED ORDER — GABAPENTIN 300 MG PO CAPS
300.0000 mg | ORAL_CAPSULE | Freq: Every day | ORAL | Status: DC
  Filled 2019-07-10: qty 1

## 2019-07-10 MED ORDER — FOLIC ACID 1 MG PO TABS
500.0000 ug | ORAL_TABLET | Freq: Every day | ORAL | Status: DC
  Administered 2019-07-10 – 2019-07-11 (×2): 0.5 mg via ORAL
  Filled 2019-07-10 (×2): qty 1

## 2019-07-10 MED ORDER — MENTHOL 3 MG MT LOZG
1.0000 | LOZENGE | OROMUCOSAL | Status: DC | PRN
  Administered 2019-07-10: 3 mg via ORAL
  Filled 2019-07-10: qty 9

## 2019-07-10 MED ORDER — POLYVINYL ALCOHOL 1.4 % OP SOLN
1.0000 [drp] | Freq: Three times a day (TID) | OPHTHALMIC | Status: DC
  Administered 2019-07-10 (×3): 1 [drp] via OPHTHALMIC
  Filled 2019-07-10: qty 15

## 2019-07-10 MED ORDER — LINAGLIPTIN 5 MG PO TABS
5.0000 mg | ORAL_TABLET | Freq: Every day | ORAL | Status: DC
  Administered 2019-07-11: 5 mg via ORAL
  Filled 2019-07-10 (×2): qty 1

## 2019-07-10 MED ORDER — PANTOPRAZOLE SODIUM 40 MG PO TBEC
40.0000 mg | DELAYED_RELEASE_TABLET | Freq: Every day | ORAL | Status: DC
  Administered 2019-07-10 – 2019-07-11 (×2): 40 mg via ORAL
  Filled 2019-07-10 (×2): qty 1

## 2019-07-10 MED ORDER — CLONIDINE HCL 0.3 MG PO TABS
0.3000 mg | ORAL_TABLET | Freq: Three times a day (TID) | ORAL | Status: DC
  Administered 2019-07-10 – 2019-07-11 (×4): 0.3 mg via ORAL
  Filled 2019-07-10 (×4): qty 1

## 2019-07-10 MED ORDER — OXYCODONE HCL 5 MG PO TABS
10.0000 mg | ORAL_TABLET | Freq: Four times a day (QID) | ORAL | Status: DC | PRN
  Administered 2019-07-10 (×2): 10 mg via ORAL
  Filled 2019-07-10 (×2): qty 2

## 2019-07-10 MED ORDER — LABETALOL HCL 200 MG PO TABS
400.0000 mg | ORAL_TABLET | Freq: Three times a day (TID) | ORAL | Status: DC
  Administered 2019-07-10 – 2019-07-11 (×4): 400 mg via ORAL
  Filled 2019-07-10 (×4): qty 2

## 2019-07-10 MED ORDER — LORATADINE 10 MG PO TABS
10.0000 mg | ORAL_TABLET | Freq: Every day | ORAL | Status: DC
  Administered 2019-07-10: 10 mg via ORAL
  Filled 2019-07-10 (×2): qty 1

## 2019-07-10 MED ORDER — ALLOPURINOL 100 MG PO TABS
100.0000 mg | ORAL_TABLET | Freq: Every evening | ORAL | Status: DC
  Administered 2019-07-10: 100 mg via ORAL
  Filled 2019-07-10: qty 1

## 2019-07-10 MED ORDER — MONTELUKAST SODIUM 10 MG PO TABS
10.0000 mg | ORAL_TABLET | Freq: Every day | ORAL | Status: DC
  Administered 2019-07-10: 10 mg via ORAL
  Filled 2019-07-10: qty 1

## 2019-07-10 MED ORDER — METHYLPREDNISOLONE SODIUM SUCC 125 MG IJ SOLR
60.0000 mg | Freq: Three times a day (TID) | INTRAMUSCULAR | Status: DC
  Administered 2019-07-10 – 2019-07-11 (×4): 60 mg via INTRAVENOUS
  Filled 2019-07-10 (×4): qty 2

## 2019-07-10 MED ORDER — AMLODIPINE BESYLATE 10 MG PO TABS
10.0000 mg | ORAL_TABLET | Freq: Every day | ORAL | Status: DC
  Administered 2019-07-10 – 2019-07-11 (×2): 10 mg via ORAL
  Filled 2019-07-10 (×2): qty 1

## 2019-07-10 MED ORDER — METHOCARBAMOL 500 MG PO TABS: 500.0000 mg | ORAL_TABLET | Freq: Four times a day (QID) | ORAL | Status: DC | PRN

## 2019-07-10 MED ORDER — SENNA 8.6 MG PO TABS: 1.0000 | ORAL_TABLET | Freq: Every day | ORAL | Status: DC | PRN

## 2019-07-10 MED ORDER — FERROUS SULFATE 325 (65 FE) MG PO TABS: 325.0000 mg | ORAL_TABLET | ORAL | Status: DC

## 2019-07-10 MED ORDER — TOFACITINIB CITRATE 5 MG PO TABS
1.0000 | ORAL_TABLET | Freq: Every day | ORAL | Status: DC
  Filled 2019-07-10: qty 1

## 2019-07-10 MED ORDER — CHLORHEXIDINE GLUCONATE CLOTH 2 % EX PADS
6.0000 | MEDICATED_PAD | Freq: Every day | CUTANEOUS | Status: DC
  Administered 2019-07-11: 6 via TOPICAL

## 2019-07-10 MED ORDER — FLUTICASONE PROPIONATE 50 MCG/ACT NA SUSP
1.0000 | Freq: Every day | NASAL | Status: DC
  Administered 2019-07-10: 1 via NASAL
  Filled 2019-07-10: qty 16

## 2019-07-10 MED ORDER — BENZONATATE 100 MG PO CAPS
200.0000 mg | ORAL_CAPSULE | Freq: Three times a day (TID) | ORAL | Status: DC | PRN
  Administered 2019-07-10 (×2): 200 mg via ORAL
  Filled 2019-07-10 (×2): qty 2

## 2019-07-10 MED ORDER — ISOSORBIDE MONONITRATE ER 60 MG PO TB24
60.0000 mg | ORAL_TABLET | Freq: Every day | ORAL | Status: DC
  Administered 2019-07-10 – 2019-07-11 (×2): 60 mg via ORAL
  Filled 2019-07-10 (×2): qty 1

## 2019-07-10 MED ORDER — INSULIN ASPART 100 UNIT/ML ~~LOC~~ SOLN
0.0000 [IU] | Freq: Every day | SUBCUTANEOUS | Status: DC
  Administered 2019-07-10: 2 [IU] via SUBCUTANEOUS

## 2019-07-10 MED ORDER — HEPARIN SODIUM (PORCINE) 5000 UNIT/ML IJ SOLN
5000.0000 [IU] | Freq: Three times a day (TID) | INTRAMUSCULAR | Status: DC
  Administered 2019-07-10 – 2019-07-11 (×4): 5000 [IU] via SUBCUTANEOUS
  Filled 2019-07-10 (×5): qty 1

## 2019-07-10 MED ORDER — RENA-VITE PO TABS
1.0000 | ORAL_TABLET | Freq: Every day | ORAL | Status: DC
  Administered 2019-07-10: 1 via ORAL
  Filled 2019-07-10: qty 1

## 2019-07-10 MED ORDER — ZOLPIDEM TARTRATE 5 MG PO TABS
5.0000 mg | ORAL_TABLET | Freq: Every day | ORAL | Status: DC
  Administered 2019-07-10: 5 mg via ORAL
  Filled 2019-07-10: qty 1

## 2019-07-10 MED ORDER — MOMETASONE FURO-FORMOTEROL FUM 200-5 MCG/ACT IN AERO
2.0000 | INHALATION_SPRAY | Freq: Two times a day (BID) | RESPIRATORY_TRACT | Status: DC
  Administered 2019-07-10 (×2): 2 via RESPIRATORY_TRACT
  Filled 2019-07-10: qty 8.8

## 2019-07-10 MED ORDER — INSULIN ASPART 100 UNIT/ML ~~LOC~~ SOLN: 0.0000 [IU] | Freq: Three times a day (TID) | SUBCUTANEOUS | Status: DC

## 2019-07-10 MED ORDER — CINACALCET HCL 30 MG PO TABS
30.0000 mg | ORAL_TABLET | Freq: Every day | ORAL | Status: DC
  Administered 2019-07-10: 30 mg via ORAL
  Filled 2019-07-10 (×2): qty 1

## 2019-07-10 MED ORDER — IRBESARTAN 300 MG PO TABS
300.0000 mg | ORAL_TABLET | Freq: Every day | ORAL | Status: DC
  Administered 2019-07-10 – 2019-07-11 (×2): 300 mg via ORAL
  Filled 2019-07-10 (×2): qty 1

## 2019-07-10 MED ORDER — LORAZEPAM 0.5 MG PO TABS: 0.5000 mg | ORAL_TABLET | Freq: Three times a day (TID) | ORAL | Status: DC | PRN

## 2019-07-10 MED ORDER — ONDANSETRON HCL 4 MG/2ML IJ SOLN: 4.0000 mg | Freq: Four times a day (QID) | INTRAMUSCULAR | Status: DC | PRN

## 2019-07-10 MED ORDER — VANCOMYCIN HCL 750 MG/150ML IV SOLN
750.0000 mg | Freq: Once | INTRAVENOUS | Status: AC
  Administered 2019-07-10: 750 mg via INTRAVENOUS
  Filled 2019-07-10: qty 150

## 2019-07-10 NOTE — ED Notes (Signed)
[facility] notified (Tara) - patient ready for transport 

## 2019-07-10 NOTE — Progress Notes (Signed)
Patient was admitted early morning with community-acquired pneumonia and acute hypoxic respiratory failure.  She was started on broad-spectrum antibiotics, cefepime, vancomycin and Flagyl.  Patient seen and examined.  She does not feel any difference than yesterday.  Her major complaint is cough.  She has a sputum but she cannot cough it up.  Denies any shortness of breath but still requiring 1 L of nasal cannula oxygen.  On examination, she has scattered rhonchi and bilateral diffuse wheezes on the lung exam.  Abdomen soft nontender.  She looks sick.  Abdomen soft and nontender.  No peripheral edema.  Chest x-ray also shows pulmonary edema. She tells me that she has a history of COPD as well.  Based on the examination, she seems to have COPD exacerbation.  I will start her on Solu-Medrol and continue current antibiotics.  I will consult nephrology for dialysis tomorrow.

## 2019-07-10 NOTE — ED Notes (Signed)
Leaving with [facility] at this time. 

## 2019-07-10 NOTE — Consult Note (Signed)
Reason for Consult: To manage dialysis and dialysis related needs Referring Physician: Darliss Cheney  Jodi Bell is an 59 y.o. female.  HPI: 59 year old female with HTN, DM 2, GERD, psoriatic arthritis, chronic back pain, ESRD on MWF at [facility] who presents with worsening shortness of breath and cough for the past few days.  She also has had symptoms of fever.  Her cough has intermittently been productive of green sputum.  She completed dialysis on Friday without any issues.  She recently had her dry weight increased because of some shaking at the end of dialysis and possibly cramping.  She visually had a dry weight of 157 and it was increased to 160.  Because of her worsening shortness of breath she was brought to the emergency department.  In the emergency department she was found to be febrile.  Blood pressure was elevated.  She was given supplemental oxygen and admitted to the hospital.  She was given antibiotics.  She specifically denies nausea, vomiting, diarrhea, chest pain.  She has not had any bloody stools.  Past Medical History:  Diagnosis Date  . Anemia   . Arthritis    oa and psoriatic ra  . Asthma   . Back pain, chronic    lower back  . Candida infection finished tx 2 days ago  . Chronic insomnia   . Chronic kidney disease    stage 3 ckd last ov nephrology 04-29-17 epic  . Chronic kidney disease (CKD) stage G4/A1, severely decreased glomerular filtration rate (GFR) between 15-29 mL/min/1.73 square meter and albuminuria creatinine ratio less than 30 mg/g (HCC)   . COPD (chronic obstructive pulmonary disease) ([place])   . Diabetes mellitus    type 2  . Eczema   . Essential hypertension   . GERD (gastroesophageal reflux disease)   . History of hiatal hernia   . Obesity   . Pneumonia few yrs ago x 2  . Sleep apnea     Past Surgical History:  Procedure Laterality Date  . BACK SURGERY  2016   lower back fusion with cage  . BIOPSY  09/23/2017   Procedure: BIOPSY;   Surgeon: Wonda Horner, MD;  Location: [facility];  Service: Endoscopy;;  . BIOPSY  05/19/2019   Procedure: BIOPSY;  Surgeon: Otis Brace, MD;  Location: [place];  Service: Gastroenterology;;  . CESAREAN SECTION  1990   x 1   . COLONOSCOPY WITH PROPOFOL N/A 09/23/2017   Procedure: COLONOSCOPY WITH PROPOFOL Hemostatic clips placed;  Surgeon: Wonda Horner, MD;  Location: [facility];  Service: Endoscopy;  Laterality: N/A;  . DILATION AND CURETTAGE OF UTERUS  1988  . ESOPHAGOGASTRODUODENOSCOPY (EGD) WITH PROPOFOL N/A 09/23/2017   Procedure: ESOPHAGOGASTRODUODENOSCOPY (EGD) WITH PROPOFOL;  Surgeon: Wonda Horner, MD;  Location: [facility];  Service: Endoscopy;  Laterality: N/A;  . ESOPHAGOGASTRODUODENOSCOPY (EGD) WITH PROPOFOL N/A 05/19/2019   Procedure: ESOPHAGOGASTRODUODENOSCOPY (EGD) WITH PROPOFOL;  Surgeon: Otis Brace, MD;  Location: [facility];  Service: Gastroenterology;  Laterality: N/A;  . GIVENS CAPSULE STUDY N/A 05/19/2019   Procedure: GIVENS CAPSULE STUDY;  Surgeon: Otis Brace, MD;  Location: [place];  Service: Gastroenterology;  Laterality: N/A;  . HOT HEMOSTASIS N/A 05/19/2019   Procedure: HOT HEMOSTASIS (ARGON PLASMA COAGULATION/BICAP);  Surgeon: Otis Brace, MD;  Location: [facility];  Service: Gastroenterology;  Laterality: N/A;  . PARTIAL KNEE ARTHROPLASTY Left 11/12/2017   Procedure: left unicompartmental arthroplasty-medial;  Surgeon: Paralee Cancel, MD;  Location: [facility];  Service: Orthopedics;  Laterality: Left;  87min  . SUBMUCOSAL INJECTION  09/23/2017   Procedure: SUBMUCOSAL INJECTION;  Surgeon: Wonda Horner, MD;  Location: [facility];  Service: Endoscopy;;  in colon  . TUBAL LIGATION  1990    Family History  Problem Relation Age of Onset  . Lung cancer Mother   . Diabetes Sister   . Heart disease Sister   . Asthma Daughter   . Arthritis Daughter   . Arthritis Daughter   . Thyroid cancer Other        1/2 sister  . Heart  disease Other        1/2 sister    Social History:  reports that she has been smoking cigarettes. She has a 20.00 pack-year smoking history. She has never used smokeless tobacco. She reports current alcohol use. She reports that she does not use drugs.  Allergies:  Allergies  Allergen Reactions  . Banana Anaphylaxis and Swelling    TONGUE AND MOUTH SWELLING  . Black Walnut Flavor Anaphylaxis    Walnuts  . Hazelnut ([place]) Allergy Skin Test Anaphylaxis    Hazelnuts  . No Healthtouch Food Allergies Anaphylaxis    Spicy Mustard 4.7.2021 Pt reports that she eats Regular Yellow Mustard  . Pecan Nut (Diagnostic) Anaphylaxis    Pecans  . Adalimumab Other (See Comments)    Blisters on abdomen =humira  . Ezetimibe Other (See Comments)    myalgias  . Ibuprofen Other (See Comments)    Renal Problems  . Leflunomide And Related Other (See Comments)    Severe Headache  . Lexapro [Escitalopram Oxalate] Other (See Comments)    Hand problems - Serotonin Syndrome   . Lisinopril Swelling    Angioedema   . Secukinumab     Cosentyx = angiodema  . Statins Other (See Comments)    Leg pains and weakness  . Trazodone And Nefazodone Other (See Comments)    Serotonin Syndrome     Medications: I have reviewed the patient's current medications.   Results for orders placed or performed during the hospital encounter of 07/09/19 (from the past 48 hour(s))  SARS Coronavirus 2 by RT PCR (hospital order, performed in [facility] hospital lab) Nasopharyngeal Nasopharyngeal Swab     Status: None   Collection Time: 07/09/19  8:19 PM   Specimen: Nasopharyngeal Swab  Result Value Ref Range   SARS Coronavirus 2 NEGATIVE NEGATIVE    Comment: (NOTE) SARS-CoV-2 target nucleic acids are NOT DETECTED. The SARS-CoV-2 RNA is generally detectable in upper and lower respiratory specimens during the acute phase of infection. The lowest concentration of SARS-CoV-2 viral copies this assay can detect is 250 copies  / mL. A negative result does not preclude SARS-CoV-2 infection and should not be used as the sole basis for treatment or other patient management decisions.  A negative result may occur with improper specimen collection / handling, submission of specimen other than nasopharyngeal swab, presence of viral mutation(s) within the areas targeted by this assay, and inadequate number of viral copies (<250 copies / mL). A negative result must be combined with clinical observations, patient history, and epidemiological information. Fact Sheet for Patients:   StrictlyIdeas.no Fact Sheet for Healthcare Providers: BankingDealers.co.za This test is not yet approved or cleared  by the [place] FDA and has been authorized for detection and/or diagnosis of SARS-CoV-2 by FDA under an Emergency Use Authorization (EUA).  This EUA will remain in effect (meaning this test can be used) for the duration of the COVID-19 declaration under Section 564(b)(1)  of the Act, 21 U.S.C. section 360bbb-3(b)(1), unless the authorization is terminated or revoked sooner. Performed at [facility], [address] [place], [place] [address]   Comprehensive metabolic panel     Status: Abnormal   Collection Time: 07/09/19  9:05 PM  Result Value Ref Range   Sodium 134 (L) 135 - 145 mmol/L   Potassium 4.5 3.5 - 5.1 mmol/L   Chloride 94 (L) 98 - 111 mmol/L   CO2 26 22 - 32 mmol/L   Glucose, Bld 89 70 - 99 mg/dL    Comment: Glucose reference range applies only to samples taken after fasting for at least 8 hours.   BUN 26 (H) 6 - 20 mg/dL   Creatinine, Ser 8.32 (H) 0.44 - 1.00 mg/dL   Calcium 9.3 8.9 - 10.3 mg/dL   Total Protein 7.3 6.5 - 8.1 g/dL   Albumin 3.7 3.5 - 5.0 g/dL   AST 17 15 - 41 U/L   ALT 12 0 - 44 U/L   Alkaline Phosphatase 94 38 - 126 U/L   Total Bilirubin 1.0 0.3 - 1.2 mg/dL   GFR calc non Af Amer 5 (L) >60 mL/min   GFR calc Af Amer 6 (L) >60  mL/min   Anion gap 14 5 - 15    Comment: Performed at [facility], [address] [place], [place] [address]  CBC with Differential     Status: Abnormal   Collection Time: 07/09/19  9:05 PM  Result Value Ref Range   WBC 6.0 4.0 - 10.5 K/uL   RBC 3.63 (L) 3.87 - 5.11 MIL/uL   Hemoglobin 11.5 (L) 12.0 - 15.0 g/dL   HCT 36.1 36.0 - 46.0 %   MCV 99.4 80.0 - 100.0 fL   MCH 31.7 26.0 - 34.0 pg   MCHC 31.9 30.0 - 36.0 g/dL   RDW 17.2 (H) 11.5 - 15.5 %   Platelets 223 150 - 400 K/uL   nRBC 0.0 0.0 - 0.2 %   Neutrophils Relative % 76 %   Neutro Abs 4.5 1.7 - 7.7 K/uL   Lymphocytes Relative 10 %   Lymphs Abs 0.6 (L) 0.7 - 4.0 K/uL   Monocytes Relative 12 %   Monocytes Absolute 0.7 0.1 - 1.0 K/uL   Eosinophils Relative 1 %   Eosinophils Absolute 0.1 0.0 - 0.5 K/uL   Basophils Relative 0 %   Basophils Absolute 0.0 0.0 - 0.1 K/uL   Immature Granulocytes 1 %   Abs Immature Granulocytes 0.03 0.00 - 0.07 K/uL    Comment: Performed at [facility], [address] [place], [place] [address]  Lactic acid, plasma     Status: None   Collection Time: 07/09/19  9:05 PM  Result Value Ref Range   Lactic Acid, Venous 0.7 0.5 - 1.9 mmol/L    Comment: Performed at [facility], [address] [place], [place] [address]  Culture, blood (routine x 2)     Status: None (Preliminary result)   Collection Time: 07/09/19  9:05 PM   Specimen: BLOOD RIGHT ARM  Result Value Ref Range   Specimen Description      BLOOD RIGHT ARM Performed at [facility], [address] [place], [place] [address]    Special Requests      BOTTLES DRAWN AEROBIC AND ANAEROBIC Blood Culture adequate volume Performed at [facility], [address] [place], [place]  [address]    Culture      NO GROWTH < 12 HOURS Performed at [facility] [address] [place], [place] [address]    Report Status PENDING   Brain natriuretic peptide     Status: Abnormal    Collection Time: 07/09/19  9:05 PM  Result Value Ref Range   B Natriuretic Peptide 775.5 (H) 0.0 - 100.0 pg/mL    Comment: Performed at [facility], [address] [place], [place] [address]  Protime-INR     Status: None   Collection Time: 07/09/19  9:05 PM  Result Value Ref Range   Prothrombin Time 13.6 11.4 - 15.2 seconds   INR 1.1 0.8 - 1.2    Comment: (NOTE) INR goal varies based on device and disease states. Performed at [facility], [address] [place], [place] [address]   APTT     Status: Abnormal   Collection Time: 07/09/19  9:05 PM  Result Value Ref Range   aPTT 37 (H) 24 - 36 seconds    Comment:        IF BASELINE aPTT IS ELEVATED, SUGGEST PATIENT RISK ASSESSMENT BE USED TO DETERMINE APPROPRIATE ANTICOAGULANT THERAPY. Performed at [facility], [address] [place], [place] [address]   Culture, blood (routine x 2)     Status: None (Preliminary result)   Collection Time: 07/09/19  9:10 PM   Specimen: BLOOD RIGHT HAND  Result Value Ref Range   Specimen Description      BLOOD RIGHT HAND Performed at [facility], [address] [place], [place] [address]    Special Requests      BOTTLES DRAWN AEROBIC AND ANAEROBIC Blood Culture adequate volume Performed at [facility], [address] [place], [place] [address]    Culture      NO GROWTH < 12 HOURS Performed at [facility], [address], [place], [place] [address]    Report Status PENDING   CBG monitoring, ED     Status: Abnormal   Collection Time: 07/10/19  1:16 AM  Result Value Ref Range   Glucose-Capillary 107 (H) 70 - 99 mg/dL    Comment: Glucose reference range applies only to samples taken after fasting for at least 8 hours.  Glucose, capillary     Status: Abnormal   Collection Time: 07/10/19  2:50 AM  Result Value Ref Range   Glucose-Capillary 103 (H) 70 - 99 mg/dL    Comment: Glucose reference range applies only to samples  taken after fasting for at least 8 hours.  Procalcitonin - Baseline     Status: None   Collection Time: 07/10/19  4:42 AM  Result Value Ref Range   Procalcitonin 0.31 ng/mL    Comment:        Interpretation: PCT (Procalcitonin) <= 0.5 ng/mL: Systemic infection (sepsis) is not likely. Local bacterial infection is possible. (NOTE)       Sepsis PCT Algorithm           Lower Respiratory Tract                                      Infection PCT Algorithm    ----------------------------     ----------------------------         PCT < 0.25 ng/mL  PCT < 0.10 ng/mL         Strongly encourage             Strongly discourage   discontinuation of antibiotics    initiation of antibiotics    ----------------------------     -----------------------------       PCT 0.25 - 0.50 ng/mL            PCT 0.10 - 0.25 ng/mL               OR       >80% decrease in PCT            Discourage initiation of                                            antibiotics      Encourage discontinuation           of antibiotics    ----------------------------     -----------------------------         PCT >= 0.50 ng/mL              PCT 0.26 - 0.50 ng/mL               AND        <80% decrease in PCT             Encourage initiation of                                             antibiotics       Encourage continuation           of antibiotics    ----------------------------     -----------------------------        PCT >= 0.50 ng/mL                  PCT > 0.50 ng/mL               AND         increase in PCT                  Strongly encourage                                      initiation of antibiotics    Strongly encourage escalation           of antibiotics                                     -----------------------------                                           PCT <= 0.25 ng/mL                                                 OR                                        >  80% decrease in PCT                                      Discontinue / Do not initiate                                             antibiotics Performed at [facility], [place] [address], [place], [place] [address]   Glucose, capillary     Status: None   Collection Time: 07/10/19  7:00 AM  Result Value Ref Range   Glucose-Capillary 77 70 - 99 mg/dL    Comment: Glucose reference range applies only to samples taken after fasting for at least 8 hours.  Glucose, capillary     Status: None   Collection Time: 07/10/19 10:58 AM  Result Value Ref Range   Glucose-Capillary 85 70 - 99 mg/dL    Comment: Glucose reference range applies only to samples taken after fasting for at least 8 hours.    DG Chest Port 1 View  Result Date: 07/09/2019 CLINICAL DATA:  Cough and fever EXAM: PORTABLE CHEST 1 VIEW COMPARISON:  None. FINDINGS: There is mild cardiomegaly. Aortic knob calcifications. Prominence of the central pulmonary vasculature seen. The visualized skeletal structures are unremarkable. IMPRESSION: Cardiomegaly and mild pulmonary vascular congestion. Electronically Signed   By: Prudencio Pair M.D.   On: 07/09/2019 20:35    ROS: 12 systems reviewed and negative except per HPI Blood pressure (!) 169/68, pulse 85, temperature 98.6 F (37 C), temperature source Oral, resp. rate 18, height 5\' 2"  (1.575 m), weight 72.9 kg, SpO2 95 %. GEN: wdwn, sitting in bed, nad ENT: no nasal discharge, mmm EYES: no scleral icterus, eomi CV: normal rate, no murmurs PULM: Mild increased work of breathing, crackles bilaterally with bilateral expiratory wheezing ABD: NABS, non-distended SKIN: no rashes or jaundice EXT: no edema, warm and well perfused  Assessment/Plan: 1 hypoxic respiratory failure/pneumonia/COPD exacerbation: Based on exam most likely COPD exacerbation.  Being treated with steroids and antibiotics.  Pulmonary edema from inadequate ultrafiltration on dialysis also likely contributing.  Titrate oxygen requirements as needed and  dialysis tomorrow as below 2 ESRD: Monday Wednesday Friday at [facility].  We will need to obtain records tomorrow.  Plan for dialysis tomorrow with regimen dictated by labs.  Likely to challenge dry weight if tolerated. 3 Hypertension: Amlodipine 10 mg, Lasix 40 mg twice daily, hydralazine 100 mg 3 times daily, irbesartan 300 mg daily, labetalol 400 mg 3 times daily, clonidine 0.3 mg 3 times daily, Imdur 60 mg daily and remains elevated.  Need to challenge dry weight as above 4. 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  No indication for ESA.  On oral iron daily 5. Metabolic Bone Disease: Continue home PhosLo and Sensipar 6.  Diabetes mellitus type 2: Linagliptin and insulin per primary team  Reesa Chew 07/10/2019, 3:07 PM

## 2019-07-10 NOTE — Progress Notes (Signed)
Pt arrived via stretcher with [facility]. [place] texted re: arrival. No family with patient. See assessment.

## 2019-07-10 NOTE — H&P (Signed)
[facility] History and Physical  Jodi Bell AOZ:[id number] DOB: 1960-04-05 DOA: 07/09/2019  Referring EDP: Marijean Bravo PCP: Rudene Anda, MD   Chief Complaint: Cough, Fever, SOB  HPI: Jodi Bell is a 59 y.o. 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  Patient reports that she started to cough and feel SOB a few days ago which has progressively worsened and took her to the ED last night at [facility]. She had noted a fever at home as well. Reports cough is productive but unable to expectorate. She was admitted in April for anemia workup. Reportedly completed full HD session yesterday with 6 pounds pulled off. SARS-CoV-2 PCR is negative. She received COVID-19 vaccination per report. Also reports close contact to family member who had bilateral pneumonia recently. Reports poor appetite over the last few days. Denies headache, dizziness, chest pain, abdominal pain, nausea, vomiting, diarrhea, constipation, dysuria, hematuria, hematochezia, melena, difficulty moving arms/legs, speech difficulty, confusion or any other complaints.  In the outside ED: Febrile to 101.63F with elevated BP's/ Required 1-2 L O2. Labs remarkable for Na 134, Cr 8.32, WBC 6.0, Hgb 11.5, Lactate WNL, BNP 775, blood cx drawn.   Patient given broad spectrum abx w/ IV Vanco/cefepime/azithromycin and albuterol inhaler treatment. No emergent need for HD, EDP did call nephrology who recommended consulting tomorrow if needed. Transferred to [facility].  Review of Systems:  All other systems negative unless noted above in HPI.   Past Medical History:  Diagnosis Date  . Anemia   . Arthritis    oa and psoriatic ra  . Asthma   . Back pain, chronic    lower back  . Candida infection finished tx 2 days ago  . Chronic insomnia   . Chronic kidney disease    stage 3 ckd last ov nephrology 04-29-17  epic  . Chronic kidney disease (CKD) stage G4/A1, severely decreased glomerular filtration rate (GFR) between 15-29 mL/min/1.73 square meter and albuminuria creatinine ratio less than 30 mg/g (HCC)   . COPD (chronic obstructive pulmonary disease) ([place])   . Diabetes mellitus    type 2  . Eczema   . Essential hypertension   . GERD (gastroesophageal reflux disease)   . History of hiatal hernia   . Obesity   . Pneumonia few yrs ago x 2  . Sleep apnea    Past Surgical History:  Procedure Laterality Date  . BACK SURGERY  2016   lower back fusion with cage  . BIOPSY  09/23/2017   Procedure: BIOPSY;  Surgeon: Wonda Horner, MD;  Location: [facility];  Service: Endoscopy;;  . BIOPSY  05/19/2019   Procedure: BIOPSY;  Surgeon: Otis Brace, MD;  Location: [place];  Service: Gastroenterology;;  . CESAREAN SECTION  1990   x 1   . COLONOSCOPY WITH PROPOFOL N/A 09/23/2017   Procedure: COLONOSCOPY WITH PROPOFOL Hemostatic clips placed;  Surgeon: Wonda Horner, MD;  Location: [facility];  Service: Endoscopy;  Laterality: N/A;  . DILATION AND CURETTAGE OF UTERUS  1988  . ESOPHAGOGASTRODUODENOSCOPY (EGD) WITH PROPOFOL N/A 09/23/2017   Procedure: ESOPHAGOGASTRODUODENOSCOPY (EGD) WITH PROPOFOL;  Surgeon: Wonda Horner, MD;  Location: [facility];  Service: Endoscopy;  Laterality: N/A;  . ESOPHAGOGASTRODUODENOSCOPY (EGD) WITH PROPOFOL N/A 05/19/2019   Procedure: ESOPHAGOGASTRODUODENOSCOPY (EGD) WITH PROPOFOL;  Surgeon: Otis Brace, MD;  Location: [facility];  Service: Gastroenterology;  Laterality:  N/A;  . GIVENS CAPSULE STUDY N/A 05/19/2019   Procedure: GIVENS CAPSULE STUDY;  Surgeon: Otis Brace, MD;  Location: [place];  Service: Gastroenterology;  Laterality: N/A;  . HOT HEMOSTASIS N/A 05/19/2019   Procedure: HOT HEMOSTASIS (ARGON PLASMA COAGULATION/BICAP);  Surgeon: Otis Brace, MD;  Location: [facility] ENDOSCOPY;  Service: Gastroenterology;  Laterality: N/A;  . PARTIAL KNEE  ARTHROPLASTY Left 11/12/2017   Procedure: left unicompartmental arthroplasty-medial;  Surgeon: Paralee Cancel, MD;  Location: [facility];  Service: Orthopedics;  Laterality: Left;  68min  . SUBMUCOSAL INJECTION  09/23/2017   Procedure: SUBMUCOSAL INJECTION;  Surgeon: Wonda Horner, MD;  Location: [facility];  Service: Endoscopy;;  in colon  . TUBAL LIGATION  1990   Social History:  reports that she has been smoking cigarettes. She has a 20.00 pack-year smoking history. She has never used smokeless tobacco. She reports current alcohol use. She reports that she does not use drugs.  Allergies  Allergen Reactions  . Banana Anaphylaxis and Swelling    TONGUE AND MOUTH SWELLING  . Black Walnut Flavor Anaphylaxis    Walnuts  . Hazelnut ([place]) Allergy Skin Test Anaphylaxis    Hazelnuts  . No Healthtouch Food Allergies Anaphylaxis    Spicy Mustard 4.7.2021 Pt reports that she eats Regular Yellow Mustard  . Pecan Nut (Diagnostic) Anaphylaxis    Pecans  . Adalimumab Other (See Comments)    Blisters on abdomen =humira  . Ezetimibe Other (See Comments)    myalgias  . Ibuprofen Other (See Comments)    Renal Problems  . Leflunomide And Related Other (See Comments)    Severe Headache  . Lexapro [Escitalopram Oxalate] Other (See Comments)    Hand problems - Serotonin Syndrome   . Lisinopril Swelling    Angioedema   . Secukinumab     Cosentyx = angiodema  . Statins Other (See Comments)    Leg pains and weakness  . Trazodone And Nefazodone Other (See Comments)    Serotonin Syndrome     Family History  Problem Relation Age of Onset  . Lung cancer Mother   . Diabetes Sister   . Heart disease Sister   . Asthma Daughter   . Arthritis Daughter   . Arthritis Daughter   . Thyroid cancer Other        1/2 sister  . Heart disease Other        1/2 sister     Prior to Admission medications   Medication Sig Start Date End Date Taking? Authorizing Provider  acetaminophen (TYLENOL) 500 MG  tablet Take 1,000 mg by mouth every 6 (six) hours as needed for mild pain.    [provider]  allopurinol (ZYLOPRIM) 100 MG tablet Take 100 mg by mouth every evening.    [provider]  amLODipine (NORVASC) 10 MG tablet Take 10 mg by mouth daily.    [provider]  budesonide-formoterol (SYMBICORT) 160-4.5 MCG/ACT inhaler Inhale 2 puffs into the lungs 2 (two) times daily.    [provider]  calcium acetate (PHOSLO) 667 MG capsule Take 3 capsules by mouth in the morning and at bedtime. 10/25/18   [provider]  carboxymethylcellulose 1 % ophthalmic solution Place 1 drop into both eyes 3 (three) times daily.    [provider]  cetirizine (ZYRTEC) 10 MG tablet Take 10 mg by mouth 2 (two) times daily as needed (hives).  06/13/17   Shelda Pal, DO  cinacalcet (SENSIPAR) 30 MG tablet Take 30 mg by mouth  daily.    [provider]  cloNIDine (CATAPRES) 0.3 MG tablet Take 1 tablet (0.3 mg total) by mouth 3 (three) times daily. 05/21/19   Hosie Poisson, MD  ELDERBERRY PO Take 2 drops by mouth daily.    [provider]  EPINEPHrine (EPIPEN) 0.3 mg/0.3 mL SOAJ injection Inject 0.3 mLs (0.3 mg total) into the muscle once. Patient taking differently: Inject 0.3 mg into the muscle as needed (for allergic reaction).  09/30/12   Le, Thao P, DO  esomeprazole (NEXIUM) 20 MG capsule Take 2 capsules (40 mg total) by mouth daily. 05/21/19   Hosie Poisson, MD  ferrous sulfate 325 (65 FE) MG tablet Take 325 mg by mouth 3 (three) times a week. TUE THUR SAT    [provider]  fluticasone (FLONASE) 50 MCG/ACT nasal spray Place 1 spray into both nostrils daily.     [provider]  folic acid (FOLVITE) 076 MCG tablet Take 400 mcg by mouth daily.     [provider]  furosemide (LASIX) 40 MG tablet Take 40 mg by mouth 2 (two) times daily.     [provider]  gabapentin (NEURONTIN) 300 MG capsule Take 1  capsule (300 mg total) by mouth at bedtime as needed (Neuropathic pain). Patient taking differently: Take 300 mg by mouth daily with supper.  12/14/17 05/18/19  Dana Allan I, MD  hydrALAZINE (APRESOLINE) 50 MG tablet Take 1 tablet (50 mg total) by mouth every 8 (eight) hours. Patient taking differently: Take 100 mg by mouth every 8 (eight) hours.  12/14/17   Bonnell Public, MD  isosorbide mononitrate (IMDUR) 60 MG 24 hr tablet Take 1 tablet (60 mg total) by mouth daily. 03/02/18   Thurnell Lose, MD  JANUVIA 25 MG tablet Take 25 mg by mouth daily.  08/09/15   [provider]  labetalol (NORMODYNE) 200 MG tablet Take 400 mg by mouth 3 (three) times daily.    [provider]  LORazepam (ATIVAN) 0.5 MG tablet Take 0.5 mg by mouth every 8 (eight) hours as needed for anxiety. 05/02/19   [provider]  methocarbamol (ROBAXIN) 500 MG tablet Take 500 mg by mouth 4 (four) times daily as needed for muscle pain. neck 05/01/19   [provider]  montelukast (SINGULAIR) 10 MG tablet Take 10 mg by mouth daily with supper.     [provider]  multivitamin (RENA-VIT) TABS tablet Take 1 tablet by mouth daily with supper.    [provider]  olmesartan (BENICAR) 40 MG tablet Take 40 mg by mouth daily. 05/06/19   [provider]  Omega-3 1000 MG CAPS Take 1,000 mg by mouth in the morning, at noon, and at bedtime.    [provider]  ondansetron (ZOFRAN) 4 MG tablet Take 1 tablet (4 mg total) by mouth every 8 (eight) hours as needed for nausea or vomiting. 05/21/19 05/20/20  Hosie Poisson, MD  Oxycodone HCl 10 MG TABS Take 1 tablet (10 mg total) by mouth every 6 (six) hours as needed (pain score 7-10). 09/15/18   Black, Lezlie Octave, NP  predniSONE (DELTASONE) 5 MG tablet Take 5 mg by mouth daily with breakfast.     [provider]  Probiotic Product (PROBIOTIC PO) Take 1 capsule by mouth daily.    [provider]  senna (SENOKOT)  8.6 MG tablet Take 1-2 tablets by mouth daily as needed for constipation.    [provider]  Tiotropium Bromide Monohydrate (SPIRIVA RESPIMAT  IN) Inhale 2 puffs into the lungs daily.    [provider]  VENTOLIN HFA 108 (90 Base) MCG/ACT inhaler Inhale 1-2 puffs into the lungs every 6 (six) hours as needed for shortness of breath. wheezing 06/13/15   [provider]  XELJANZ 5 MG TABS Take 1 tablet by mouth at bedtime. 05/17/19   [provider]  zolpidem (AMBIEN) 5 MG tablet Take 5 mg by mouth at bedtime. 05/05/19   [provider]   Physical Exam: Vitals:   07/10/19 0051 07/10/19 0100 07/10/19 0130 07/10/19 0235  BP:  131/68 130/64 (!) 147/62  Pulse:  79 70 76  Resp:  13 14 18   Temp: 98.6 F (37 C)   98.8 F (37.1 C)  TempSrc: Oral   Oral  SpO2:  100% 99% 100%  Weight:    72.9 kg  Height:    5\' 2"  (1.575 m)    Wt Readings from Last 3 Encounters:  07/10/19 72.9 kg  05/21/19 78.1 kg  09/24/18 67.8 kg    . General:  Appears calm and comfortable. AAOx4. Appears fatigued. . Eyes: EOMI, normal lids, irises & conjunctiva . ENT: grossly normal hearing, lips & tongue . Neck: normal ROM . Cardiovascular: RRR, no m/r/g. No LE edema. Marland Kitchen Respiratory: Bilateral expiratory wheezing and diffuse rhonchi. Normal respiratory effort. . Abdomen: soft, ntnd . Skin: no rash or induration seen on limited exam . Musculoskeletal: grossly normal tone BUE/BLE . Psychiatric: grossly normal mood and affect, speech fluent and appropriate . Neurologic: grossly non-focal.          Labs on Admission:  Basic Metabolic Panel: Recent Labs  Lab 07/09/19 2105  NA 134*  K 4.5  CL 94*  CO2 26  GLUCOSE 89  BUN 26*  CREATININE 8.32*  CALCIUM 9.3   Liver Function Tests: Recent Labs  Lab 07/09/19 2105  AST 17  ALT 12  ALKPHOS 94  BILITOT 1.0  PROT 7.3  ALBUMIN 3.7   No results for input(s): LIPASE, AMYLASE in the last 168 hours. No results for input(s):  AMMONIA in the last 168 hours. CBC: Recent Labs  Lab 07/09/19 2105  WBC 6.0  NEUTROABS 4.5  HGB 11.5*  HCT 36.1  MCV 99.4  PLT 223   Cardiac Enzymes: No results for input(s): CKTOTAL, CKMB, CKMBINDEX, TROPONINI in the last 168 hours.  BNP (last 3 results) Recent Labs    07/09/19 2105  BNP 775.5*    ProBNP (last 3 results) No results for input(s): PROBNP in the last 8760 hours.  CBG: Recent Labs  Lab 07/10/19 0116 07/10/19 0250  GLUCAP 107* 103*    Radiological Exams on Admission: DG Chest Port 1 View  Result Date: 07/09/2019 CLINICAL DATA:  Cough and fever EXAM: PORTABLE CHEST 1 VIEW COMPARISON:  None. FINDINGS: There is mild cardiomegaly. Aortic knob calcifications. Prominence of the central pulmonary vasculature seen. The visualized skeletal structures are unremarkable. IMPRESSION: Cardiomegaly and mild pulmonary vascular congestion. Electronically Signed   By: Prudencio Pair M.D.   On: 07/09/2019 20:35    EKG: Independently reviewed. HR 85. Sinus rhythm. QTc 452. No STEMI.  Assessment/Plan Principal Problem:   Acute respiratory failure with hypoxia (HCC) Active Problems:   Type 2 diabetes mellitus (HCC)   INSOMNIA, CHRONIC   Essential hypertension   COPD (chronic obstructive pulmonary disease) (HCC)   ESRD (end stage renal disease) (HCC)   OSA (obstructive sleep apnea)  59 y.o. female with PMH of ESRD on MWF HD, COPD/Asthma  not on O2, T2DM, HLD, HTN, Anxiety, OSA presenting with several days of dyspnea, productive cough, and fever and admitted for possible PNA.  Acute Hypoxic Respiratory Failure Possible PNA Hx COPD/Asthma  - several days of dyspnea, productive cough, and fever - O2 saturation high 80s-low 90s on arrival, improved with 2 L O2. Temp 101.40F, no leukocytosis. Lactate WNL - CXR read as mild pulmonary vascular congestion.   - Wheezing, crackles, rhonchi on exam. No lower extremity edema, BNP 775 which appears to be baseline with ESRD -  started on Vanco/cefepime/azithromycin and albuterol inhaler treatment at outside ED - Cont Vanc and Cefepime for now; possibly viral  - Procal ordered - hx of COPD/Asthma with significant wheezing; cont inhalers - O2 PRN - f/u blood cx - hx of tobacco use, currently smoke 0.25 ppd, declined nicotine patch   ESRD - Renal consulted for dialysis - appears euvolemic  - electrolytes stable - Cont Phoslo, Cinacalcet - Daily weights - I's and O's  HTN - cont Benicar, Labetalol, Imdur, Hydralazine, Clonidine, Norvasc - denies chest pain   Type 2 DM - cont home meds with sliding scale  - Cont Gabapentin   Psoriatic Arthritis - cont Prednisone and Xeljanz  Insomnia Anxiety  - Ambien PRN - cont Xanax   Code Status: Full DVT Prophylaxis: Heparin Family Communication: None Disposition Plan: Admit to inpatient. Patient is at high risk for further decompensation due to age and co-morbidities. Requiring O2 and IV meds currently. Anticipate discharge home in 3-4 days.   Time spent: 70 minutes  Chauncey Mann, MD [facility] Pager [phone]

## 2019-07-11 LAB — BASIC METABOLIC PANEL
Anion gap: 16 — ABNORMAL HIGH (ref 5–15)
BUN: 49 mg/dL — ABNORMAL HIGH (ref 6–20)
CO2: 19 mmol/L — ABNORMAL LOW (ref 22–32)
Calcium: 8.7 mg/dL — ABNORMAL LOW (ref 8.9–10.3)
Chloride: 94 mmol/L — ABNORMAL LOW (ref 98–111)
Creatinine, Ser: 10.35 mg/dL — ABNORMAL HIGH (ref 0.44–1.00)
GFR calc Af Amer: 4 mL/min — ABNORMAL LOW (ref >60)
GFR calc non Af Amer: 4 mL/min — ABNORMAL LOW (ref >60)
Glucose, Bld: 175 mg/dL — ABNORMAL HIGH (ref 70–99)
Potassium: 5.2 mmol/L — ABNORMAL HIGH (ref 3.5–5.1)
Sodium: 129 mmol/L — ABNORMAL LOW (ref 135–145)

## 2019-07-11 LAB — CBC
HCT: 32.9 % — ABNORMAL LOW (ref 36.0–46.0)
Hemoglobin: 10.5 g/dL — ABNORMAL LOW (ref 12.0–15.0)
MCH: 31.3 pg (ref 26.0–34.0)
MCHC: 31.9 g/dL (ref 30.0–36.0)
MCV: 98.2 fL (ref 80.0–100.0)
Platelets: 227 10*3/uL (ref 150–400)
RBC: 3.35 MIL/uL — ABNORMAL LOW (ref 3.87–5.11)
RDW: 16 % — ABNORMAL HIGH (ref 11.5–15.5)
WBC: 3.9 10*3/uL — ABNORMAL LOW (ref 4.0–10.5)
nRBC: 0 % (ref 0.0–0.2)

## 2019-07-11 LAB — GLUCOSE, CAPILLARY
Glucose-Capillary: 141 mg/dL — ABNORMAL HIGH (ref 70–99)
Glucose-Capillary: 178 mg/dL — ABNORMAL HIGH (ref 70–99)

## 2019-07-11 LAB — HEPATITIS B SURFACE ANTIBODY,QUALITATIVE: Hep B S Ab: NONREACTIVE

## 2019-07-11 LAB — HEPATITIS B CORE ANTIBODY, TOTAL: Hep B Core Total Ab: NONREACTIVE

## 2019-07-11 LAB — MAGNESIUM: Magnesium: 1.9 mg/dL (ref 1.7–2.4)

## 2019-07-11 LAB — HEPATITIS B SURFACE ANTIGEN: Hepatitis B Surface Ag: NONREACTIVE

## 2019-07-11 MED ORDER — VANCOMYCIN HCL IN DEXTROSE 750-5 MG/150ML-% IV SOLN
INTRAVENOUS | Status: AC
  Administered 2019-07-11: 750 mg via INTRAVENOUS
  Filled 2019-07-11: qty 150

## 2019-07-11 MED ORDER — CEFDINIR 300 MG PO CAPS: 300.0000 mg | ORAL_CAPSULE | ORAL | 0 refills | Status: AC

## 2019-07-11 MED ORDER — PREDNISONE 50 MG PO TABS: 50.0000 mg | ORAL_TABLET | Freq: Every day | ORAL | 0 refills | Status: AC

## 2019-07-11 MED ORDER — CEFDINIR 300 MG PO CAPS
300.0000 mg | ORAL_CAPSULE | Freq: Once | ORAL | Status: AC
  Administered 2019-07-11: 300 mg via ORAL
  Filled 2019-07-11 (×2): qty 1

## 2019-07-11 NOTE — Progress Notes (Signed)
Jodi Bell to be discharged Home  per MD order. Discussed prescriptions and follow up appointments with the patient. Prescriptions given to patient; medication list explained in detail. Patient verbalized understanding.  Skin clean, dry and intact without evidence of skin break down, no evidence of skin tears noted. IV catheter discontinued intact. Site without signs and symptoms of complications. Dressing and pressure applied. Pt denies pain at the site currently. No complaints noted.  Patient free of lines, drains, and wounds.   An After Visit Summary (AVS) was printed and given to the patient. Patient escorted via wheelchair, and discharged home via private auto.  [name], RN

## 2019-07-11 NOTE — Discharge Instructions (Signed)
Community-Acquired Pneumonia, Adult Pneumonia is a type of lung infection that causes swelling in the airways of the lungs. Mucus and fluid may also build up inside the airways. This may cause coughing and difficulty breathing. There are different types of pneumonia. One type can develop while a person is in a hospital. A different type is called community-acquired pneumonia. It develops in people who are not, and have not recently been, in the hospital or another type of health care facility. What are the causes? This condition may be caused by:  Viruses. This is the most common cause of pneumonia.  Bacteria. Community-acquired pneumonia is often caused by Streptococcus pneumoniae bacteria. These bacteria are often passed from one person to another by breathing in droplets from the cough or sneeze of an infected person.  Fungi. This is the least common cause of pneumonia. What increases the risk? The following factors may make you more likely to develop this condition:  Having a chronic disease, such as chronic obstructive pulmonary disease (COPD), asthma, congestive heart failure, cystic fibrosis, diabetes, or kidney disease.  Having early-stage or late-stage HIV.  Having sickle cell disease.  Having had your spleen removed (splenectomy).  Having poor dental hygiene.  Having a medical condition that increases the risk of breathing in (aspirating) secretions from your own mouth and nose.  Having a weakened body defense system (immune system).  Being a smoker.  Traveling to areas where pneumonia-causing germs commonly exist.  Being around animal habitats or animals that have pneumonia-causing germs, including birds, bats, rabbits, cats, and farm animals. What are the signs or symptoms? Symptoms of this condition include:  A dry cough.  A wet (productive) cough.  Fever.  Sweating.  Chest pain, especially when breathing deeply or coughing.  Rapid breathing or difficulty  breathing.  Shortness of breath.  Shaking chills.  Fatigue.  Muscle aches. How is this diagnosed? This condition may be diagnosed based on:  Your medical history.  A physical exam. You may also have tests, including:  Chest X-rays.  Tests of your blood oxygen level and other blood gases.  Tests on blood, mucus (sputum), fluid around your lungs (pleural fluid), and urine. If your pneumonia is severe, other tests may be done to find the exact cause of your illness. How is this treated? Treatment for this condition depends on many factors, such as the cause of your pneumonia, the medicines you take, and other medical conditions that you have. For most adults, treatment and recovery from pneumonia may occur at home. In some cases, treatment must happen in a hospital. Treatment may include:  Medicines that are given by mouth or through an IV, including: ? Antibiotic medicines, if the pneumonia was caused by bacteria. ? Antiviral medicines, if the pneumonia was caused by a virus.  Being given extra oxygen.  Respiratory therapy. Although rare, treating severe pneumonia may include:  Using a machine to help you breathe (mechanical ventilation). This is done if you are not breathing well on your own and you cannot maintain a safe blood oxygen level.  Thoracentesis. This is a procedure to remove fluid from around one lung or both lungs to help you breathe better. Follow these instructions at home:  Medicines  Take over-the-counter and prescription medicines only as told by your health care provider. ? Only take cough medicine if you are losing sleep. Be aware that cough medicine can prevent your body's natural ability to remove mucus from your lungs.  If you were prescribed an antibiotic   medicine, take it as told by your health care provider. Do not stop taking the antibiotic even if you start to feel better. General instructions  Sleep in a semi-upright position at night. Try  sleeping in a reclining chair, or place a few pillows under your head.  Rest as needed and get at least 8 hours of sleep each night.  Drink enough water to keep your urine pale yellow. This will help to thin out mucus secretions in your lungs.  Eat a healthy diet that includes plenty of vegetables, fruits, whole grains, low-fat dairy products, and lean protein.  Do not use any products that contain nicotine or tobacco, such as cigarettes, e-cigarettes, and chewing tobacco. If you need help quitting, ask your health care provider.  Keep all follow-up visits as told by your health care provider. This is important. How is this prevented? You can lower your risk of developing community-acquired pneumonia by:  Getting a pneumococcal vaccine. There are different types and schedules of pneumococcal vaccines. Ask your health care provider which option is best for you. Consider getting the vaccine if: ? You are older than 59 years of age. ? You are older than 59 years of age and are undergoing cancer treatment, have chronic lung disease, or have other medical conditions that affect your immune system. Ask your health care provider if this applies to you.  Getting an influenza vaccine every year. Ask your health care provider which type of vaccine is best for you.  Getting regular checkups from your dentist.  Washing your hands often. If soap and water are not available, use hand sanitizer. Contact a health care provider if:  You have a fever.  You are losing sleep because you cannot control your cough with cough medicine. Get help right away if:  You have worsening shortness of breath.  You have increased chest pain.  Your sickness becomes worse, especially if you are an older adult or have a weakened immune system.  You cough up blood. Summary  Pneumonia is an infection of the lungs.  Community-acquired pneumonia develops in people who have not been in the hospital. It can be caused  by bacteria, viruses, or fungi.  This condition may be treated with antibiotics or antiviral medicines.  Severe cases may require hospitalization, mechanical ventilation, and other procedures to drain fluid from the lungs. This information is not intended to replace advice given to you by your health care provider. Make sure you discuss any questions you have with your health care provider. Document Revised: 09/24/2017 Document Reviewed: 09/24/2017 Elsevier Patient Education  2020 [employer]  

## 2019-07-11 NOTE — Progress Notes (Signed)
[facility] Progress Note  Subjective: feeling good, wants to go home  Vitals:   07/11/19 0714 07/11/19 0730 07/11/19 0800 07/11/19 0830  BP: (!) 151/85 (!) 188/76 (!) 166/83 (!) 176/81  Pulse: 81 78 78 80  Resp:      Temp:      TempSrc:      SpO2:      Weight:      Height:        Exam: GEN: wdwn, sitting in bed, nad ENT: no nasal discharge, mmm EYES: no scleral icterus, eomi CV: normal rate, no murmurs PULM: Mild increased work of breathing, crackles bilaterally with bilateral expiratory wheezing ABD: NABS, non-distended SKIN: no rashes or jaundice EXT: no edema, warm and well perfused    OP HD: get records   Assessment/ Plan: 1 hypoxic respiratory failure/pneumonia/COPD exacerbation: Based on exam most likely COPD exacerbation.  Being treated with steroids and abx. Feeling much better. On HD now, wants to go home.  2 ESRD: Monday Wednesday Friday at [facility]  On HD this am, stable for dc from renal standpoint.  3 Hypertension: Amlodipine 10 mg, Lasix 40 mg twice daily, hydralazine 100 mg 3 times daily, irbesartan 300 mg daily, labetalol 400 mg 3 times daily, clonidine 0.3 mg 3 times daily, Imdur 60 mg daily and remains elevated.  4. 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  No indication for ESA.  On oral iron daily 5. Metabolic Bone Disease: Continue home PhosLo and Sensipar 6.  Diabetes mellitus type 2: Linagliptin and insulin per primary team      Rob Terriona Horlacher 07/11/2019, 9:17 AM   Recent Labs  Lab 07/09/19 2105 07/11/19 0318  K 4.5 5.2*  BUN 26* 49*  CREATININE 8.32* 10.35*  CALCIUM 9.3 8.7*  HGB 11.5* 10.5*   Inpatient medications: . allopurinol  100 mg Oral QPM  . amLODipine  10 mg Oral Daily  . calcium acetate  2,001 mg Oral TID WC  . Chlorhexidine Gluconate Cloth  6 each Topical Q0600  . cinacalcet  30 mg Oral Q breakfast  . cloNIDine  0.3 mg Oral TID  . ferrous sulfate  325 mg Oral Once per day on Mon Wed Fri  . fluticasone  1 spray  Each Nare Daily  . folic acid  785 mcg Oral Daily  . furosemide  40 mg Oral BID  . gabapentin  300 mg Oral Q supper  . heparin  5,000 Units Subcutaneous Q8H  . hydrALAZINE  100 mg Oral Q8H  . insulin aspart  0-5 Units Subcutaneous QHS  . insulin aspart  0-6 Units Subcutaneous TID WC  . irbesartan  300 mg Oral Daily  . isosorbide mononitrate  60 mg Oral Daily  . labetalol  400 mg Oral TID  . linagliptin  5 mg Oral Daily  . loratadine  10 mg Oral Daily  . methylPREDNISolone (SOLU-MEDROL) injection  60 mg Intravenous Q8H  . mometasone-formoterol  2 puff Inhalation BID  . montelukast  10 mg Oral Q supper  . multivitamin  1 tablet Oral Q supper  . pantoprazole  40 mg Oral Daily  . polyvinyl alcohol  1 drop Both Eyes TID  . predniSONE  5 mg Oral Q breakfast  . Tofacitinib Citrate  1 tablet Oral QHS  . umeclidinium bromide  1 puff Inhalation Daily  . zolpidem  5 mg Oral QHS   . sodium chloride 250 mL (07/09/19 2122)  . ceFEPime (MAXIPIME) IV    . vancomycin  sodium chloride, acetaminophen, albuterol, benzonatate, LORazepam, menthol-cetylpyridinium, methocarbamol, ondansetron (ZOFRAN) IV, oxyCODONE, senna

## 2019-07-11 NOTE — Discharge Summary (Signed)
Physician Discharge Summary  Jodi Bell JAS:[id number] DOB: 02-28-1960 DOA: 07/09/2019  PCP: Rudene Anda, MD  Admit date: 07/09/2019 Discharge date: 07/11/2019  Admitted From: Home Disposition: Home  Recommendations for Outpatient Follow-up:  1. Follow up with PCP in 1-2 weeks 2. Follow-up with nephrology for scheduled dialysis 3. Please obtain BMP/CBC in one week 4. Please follow up on the following pending results:  Home Health: None Equipment/Devices: None  Discharge Condition: Stable CODE STATUS: Full code Diet recommendation: Cardiac/renal  Subjective: Seen and examined in the dialysis unit. Feels " excellent". No shortness of breath or any other complaint. Has been off of oxygen since 5 PM yesterday.  HPI: Jodi Bell is a 59 y.o. 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  Patient reports that she started to cough and feel SOB a few days ago which has progressively worsened and took her to the ED last night at [facility]. She had noted a fever at home as well. Reports cough is productive but unable to expectorate. She was admitted in April for anemia workup. Reportedly completed full HD session yesterday with 6 pounds pulled off. SARS-CoV-2 PCR is negative. She received COVID-19 vaccination per report. Also reports close contact to family member who had bilateral pneumonia recently. Reports poor appetite over the last few days. Denies headache, dizziness, chest pain, abdominal pain, nausea, vomiting, diarrhea, constipation, dysuria, hematuria, hematochezia, melena, difficulty moving arms/legs, speech difficulty, confusion or any other complaints.  In the outside ED: Febrile to 101.9F with elevated BP's/ Required 1-2 L O2. Labs remarkable for Na 134, Cr 8.32, WBC 6.0, Hgb 11.5, Lactate WNL, BNP 775, blood cx drawn.   Patient given broad  spectrum abx w/ IV Vanco/cefepime/azithromycin and albuterol inhaler treatment. No emergent need for HD, EDP did call nephrology who recommended consulting tomorrow if needed. Transferred to [facility].  Brief/Interim Summary: Patient was initially admitted with acute hypoxic respiratory failure secondary to community-acquired pneumonia and acute pulmonary edema and she was started on cefepime, vancomycin and Flagyl. When I saw this patient yesterday, she was diffusely wheezy and based on that, she was diagnosed with acute COPD exacerbation. I started her on Solu-Medrol and continued antibiotics. Nephrology was consulted. She was getting her scheduled dialysis today in the ED. She feels " excellent" today without having any shortness of breath and she has been off of oxygen since 5 PM yesterday. She is ready to go home. She is going to be discharged home after dialysis today. I will discharge her on cefdinir. She will get her first dose today after dialysis and then 3 more doses. I will also discharge her on prednisone for 3 more doses starting tomorrow. Resume all her home medications and follow with PCP and nephrology.  Discharge Diagnoses:  Principal Problem:   Acute respiratory failure with hypoxia (HCC) Active Problems:   Type 2 diabetes mellitus (HCC)   INSOMNIA, CHRONIC   Essential hypertension   COPD (chronic obstructive pulmonary disease) (HCC)   ESRD (end stage renal disease) (HCC)   OSA (obstructive sleep apnea)    Discharge Instructions   Allergies as of 07/11/2019      Reactions   Banana Anaphylaxis, Swelling   TONGUE AND MOUTH SWELLING   Black Walnut Flavor Anaphylaxis   Walnuts   Hazelnut (filbert) Allergy Skin Test Anaphylaxis   Hazelnuts   No Healthtouch Food Allergies  Anaphylaxis   Spicy Mustard 4.7.2021 Pt reports that she eats Regular Yellow Mustard   Pecan Nut (diagnostic) Anaphylaxis   Pecans   Adalimumab Other (See Comments)   Blisters on abdomen =humira   Ezetimibe  Other (See Comments)   myalgias   Ibuprofen Other (See Comments)   Renal Problems   Leflunomide And Related Other (See Comments)   Severe Headache   Lexapro [escitalopram Oxalate] Other (See Comments)   Hand problems - Serotonin Syndrome    Lisinopril Swelling   Angioedema    Secukinumab    Cosentyx = angiodema   Statins Other (See Comments)   Leg pains and weakness   Trazodone And Nefazodone Other (See Comments)   Serotonin Syndrome       Medication List    TAKE these medications   acetaminophen 500 MG tablet Commonly known as: TYLENOL Take 1,000 mg by mouth every 6 (six) hours as needed for mild pain.   allopurinol 100 MG tablet Commonly known as: ZYLOPRIM Take 100 mg by mouth every evening.   amLODipine 10 MG tablet Commonly known as: NORVASC Take 10 mg by mouth daily.   budesonide-formoterol 160-4.5 MCG/ACT inhaler Commonly known as: SYMBICORT Inhale 2 puffs into the lungs 2 (two) times daily.   calcium acetate 667 MG capsule Commonly known as: PHOSLO Take 3 capsules by mouth in the morning and at bedtime.   carboxymethylcellulose 1 % ophthalmic solution Place 1 drop into both eyes 3 (three) times daily.   cefdinir 300 MG capsule Commonly known as: OMNICEF Take 1 capsule (300 mg total) by mouth every other day for 3 doses. Start taking on: July 13, 2019   cetirizine 10 MG tablet Commonly known as: ZYRTEC Take 10 mg by mouth 2 (two) times daily as needed (hives).   cinacalcet 30 MG tablet Commonly known as: SENSIPAR Take 30 mg by mouth daily.   cloNIDine 0.3 MG tablet Commonly known as: CATAPRES Take 1 tablet (0.3 mg total) by mouth 3 (three) times daily.   ELDERBERRY PO Take 2 drops by mouth daily.   EPINEPHrine 0.3 mg/0.3 mL Soaj injection Commonly known as: EpiPen Inject 0.3 mLs (0.3 mg total) into the muscle once. What changed:   when to take this  reasons to take this   esomeprazole 20 MG capsule Commonly known as: NEXIUM Take 2  capsules (40 mg total) by mouth daily.   ferrous sulfate 325 (65 FE) MG tablet Take 325 mg by mouth 3 (three) times a week. TUE THUR SAT   fluticasone 50 MCG/ACT nasal spray Commonly known as: FLONASE Place 1 spray into both nostrils daily.   folic acid 195 MCG tablet Commonly known as: FOLVITE Take 400 mcg by mouth daily.   furosemide 40 MG tablet Commonly known as: LASIX Take 40 mg by mouth 2 (two) times daily.   gabapentin 300 MG capsule Commonly known as: Neurontin Take 1 capsule (300 mg total) by mouth at bedtime as needed (Neuropathic pain). What changed: when to take this   hydrALAZINE 50 MG tablet Commonly known as: APRESOLINE Take 1 tablet (50 mg total) by mouth every 8 (eight) hours. What changed: how much to take   isosorbide mononitrate 60 MG 24 hr tablet Commonly known as: IMDUR Take 1 tablet (60 mg total) by mouth daily.   Januvia 25 MG tablet Generic drug: sitaGLIPtin Take 25 mg by mouth daily.   labetalol 200 MG tablet Commonly known as: NORMODYNE Take 400 mg by mouth 3 (three) times daily.  LORazepam 0.5 MG tablet Commonly known as: ATIVAN Take 0.5 mg by mouth every 8 (eight) hours as needed for anxiety.   methocarbamol 500 MG tablet Commonly known as: ROBAXIN Take 500 mg by mouth 4 (four) times daily as needed for muscle pain. neck   montelukast 10 MG tablet Commonly known as: SINGULAIR Take 10 mg by mouth daily with supper.   multivitamin Tabs tablet Take 1 tablet by mouth daily with supper.   olmesartan 40 MG tablet Commonly known as: BENICAR Take 40 mg by mouth daily.   Omega-3 1000 MG Caps Take 1,000 mg by mouth in the morning, at noon, and at bedtime.   ondansetron 4 MG tablet Commonly known as: Zofran Take 1 tablet (4 mg total) by mouth every 8 (eight) hours as needed for nausea or vomiting.   Oxycodone HCl 10 MG Tabs Take 1 tablet (10 mg total) by mouth every 6 (six) hours as needed (pain score 7-10).   predniSONE 5 MG  tablet Commonly known as: DELTASONE Take 5 mg by mouth daily with breakfast. What changed: Another medication with the same name was added. Make sure you understand how and when to take each.   predniSONE 50 MG tablet Commonly known as: DELTASONE Take 1 tablet (50 mg total) by mouth daily with breakfast for 3 days. Start taking on: July 12, 2019 What changed: You were already taking a medication with the same name, and this prescription was added. Make sure you understand how and when to take each.   PROBIOTIC PO Take 1 capsule by mouth daily.   senna 8.6 MG tablet Commonly known as: SENOKOT Take 1-2 tablets by mouth daily as needed for constipation.   SPIRIVA RESPIMAT IN Inhale 2 puffs into the lungs daily.   Ventolin HFA 108 (90 Base) MCG/ACT inhaler Generic drug: albuterol Inhale 1-2 puffs into the lungs every 6 (six) hours as needed for shortness of breath. wheezing   Xeljanz 5 MG Tabs Generic drug: Tofacitinib Citrate Take 1 tablet by mouth at bedtime.   zolpidem 5 MG tablet Commonly known as: AMBIEN Take 5 mg by mouth at bedtime.      Follow-up Information    Rudene Anda, MD Follow up in 1 week(s).   Specialty: Internal Medicine Contact information: [address] [place] [address] [phone]          Allergies  Allergen Reactions  . Banana Anaphylaxis and Swelling    TONGUE AND MOUTH SWELLING  . Black Walnut Flavor Anaphylaxis    Walnuts  . Hazelnut (Filbert) Allergy Skin Test Anaphylaxis    Hazelnuts  . No Healthtouch Food Allergies Anaphylaxis    Spicy Mustard 4.7.2021 Pt reports that she eats Regular Yellow Mustard  . Pecan Nut (Diagnostic) Anaphylaxis    Pecans  . Adalimumab Other (See Comments)    Blisters on abdomen =humira  . Ezetimibe Other (See Comments)    myalgias  . Ibuprofen Other (See Comments)    Renal Problems  . Leflunomide And Related Other (See Comments)    Severe Headache  . Lexapro [Escitalopram Oxalate]  Other (See Comments)    Hand problems - Serotonin Syndrome   . Lisinopril Swelling    Angioedema   . Secukinumab     Cosentyx = angiodema  . Statins Other (See Comments)    Leg pains and weakness  . Trazodone And Nefazodone Other (See Comments)    Serotonin Syndrome     Consultations: Nephrology   Procedures/Studies: DG Chest  Port 1 View  Result Date: 07/09/2019 CLINICAL DATA:  Cough and fever EXAM: PORTABLE CHEST 1 VIEW COMPARISON:  None. FINDINGS: There is mild cardiomegaly. Aortic knob calcifications. Prominence of the central pulmonary vasculature seen. The visualized skeletal structures are unremarkable. IMPRESSION: Cardiomegaly and mild pulmonary vascular congestion. Electronically Signed   By: Prudencio Pair M.D.   On: 07/09/2019 20:35      Discharge Exam: Vitals:   07/11/19 0900 07/11/19 1000  BP: (!) 164/76 (!) 156/80  Pulse: 78 80  Resp:    Temp:    SpO2:     Vitals:   07/11/19 0800 07/11/19 0830 07/11/19 0900 07/11/19 1000  BP: (!) 166/83 (!) 176/81 (!) 164/76 (!) 156/80  Pulse: 78 80 78 80  Resp:      Temp:      TempSrc:      SpO2:      Weight:      Height:        General: Pt is alert, awake, not in acute distress Cardiovascular: RRR, S1/S2 +, no rubs, no gallops Respiratory: Faint rhonchi bilaterally, no wheezing, no rhonchi Abdominal: Soft, NT, ND, bowel sounds + Extremities: no edema, no cyanosis    The results of significant diagnostics from this hospitalization (including imaging, microbiology, ancillary and laboratory) are listed below for reference.     Microbiology: Recent Results (from the past 240 hour(s))  SARS Coronavirus 2 by RT PCR (hospital order, performed in [facility] hospital lab) Nasopharyngeal Nasopharyngeal Swab     Status: None   Collection Time: 07/09/19  8:19 PM   Specimen: Nasopharyngeal Swab  Result Value Ref Range Status   SARS Coronavirus 2 NEGATIVE NEGATIVE Final    Comment: (NOTE) SARS-CoV-2 target nucleic acids  are NOT DETECTED. The SARS-CoV-2 RNA is generally detectable in upper and lower respiratory specimens during the acute phase of infection. The lowest concentration of SARS-CoV-2 viral copies this assay can detect is 250 copies / mL. A negative result does not preclude SARS-CoV-2 infection and should not be used as the sole basis for treatment or other patient management decisions.  A negative result may occur with improper specimen collection / handling, submission of specimen other than nasopharyngeal swab, presence of viral mutation(s) within the areas targeted by this assay, and inadequate number of viral copies (<250 copies / mL). A negative result must be combined with clinical observations, patient history, and epidemiological information. Fact Sheet for Patients:   StrictlyIdeas.no Fact Sheet for Healthcare Providers: BankingDealers.co.za This test is not yet approved or cleared  by the [place] FDA and has been authorized for detection and/or diagnosis of SARS-CoV-2 by FDA under an Emergency Use Authorization (EUA).  This EUA will remain in effect (meaning this test can be used) for the duration of the COVID-19 declaration under Section 564(b)(1) of the Act, 21 U.S.C. section 360bbb-3(b)(1), unless the authorization is terminated or revoked sooner. Performed at [facility], [address] [place], [place] [address]   Culture, blood (routine x 2)     Status: None (Preliminary result)   Collection Time: 07/09/19  9:05 PM   Specimen: BLOOD RIGHT ARM  Result Value Ref Range Status   Specimen Description   Final    BLOOD RIGHT ARM Performed at [facility], [address] [place], [place] [address]    Special Requests   Final    BOTTLES DRAWN AEROBIC AND ANAEROBIC Blood Culture adequate volume Performed at [facility], [address] [place], [place] [address]    Culture   Final    NO  GROWTH 1 DAY Performed at [facility] [address], [place], [place] [address]    Report Status PENDING  Incomplete  Culture, blood (routine x 2)     Status: None (Preliminary result)   Collection Time: 07/09/19  9:10 PM   Specimen: BLOOD RIGHT HAND  Result Value Ref Range Status   Specimen Description   Final    BLOOD RIGHT HAND Performed at [facility], [address] [place], [place] [address]    Special Requests   Final    BOTTLES DRAWN AEROBIC AND ANAEROBIC Blood Culture adequate volume Performed at [facility], [address], [place], [place] [address]    Culture   Final    NO GROWTH 1 DAY Performed at [facility], [place] [address], [place], [place] [address]    Report Status PENDING  Incomplete  MRSA culture     Status: None (Preliminary result)   Collection Time: 07/10/19 10:47 AM   Specimen: Body Fluid  Result Value Ref Range Status   Specimen Description FLUID  Final   Special Requests BODY FLUID  Final   Culture   Final    NO GROWTH < 24 HOURS Performed at [facility], [address], [place], [place] [address]    Report Status PENDING  Incomplete     Labs: BNP (last 3 results) Recent Labs    07/09/19 2105  BNP 485.4*   Basic Metabolic Panel: Recent Labs  Lab 07/09/19 2105 07/11/19 0318  NA 134* 129*  K 4.5 5.2*  CL 94* 94*  CO2 26 19*  GLUCOSE 89 175*  BUN 26* 49*  CREATININE 8.32* 10.35*  CALCIUM 9.3 8.7*  MG  --  1.9   Liver Function Tests: Recent Labs  Lab 07/09/19 2105  AST 17  ALT 12  ALKPHOS 94  BILITOT 1.0  PROT 7.3  ALBUMIN 3.7   No results for input(s): LIPASE, AMYLASE in the last 168 hours. No results for input(s): AMMONIA in the last 168 hours. CBC: Recent Labs  Lab 07/09/19 2105 07/11/19 0318  WBC 6.0 3.9*  NEUTROABS 4.5  --   HGB 11.5* 10.5*  HCT 36.1 32.9*  MCV 99.4 98.2  PLT 223 227   Cardiac Enzymes: No results for input(s): CKTOTAL, CKMB, CKMBINDEX, TROPONINI  in the last 168 hours. BNP: Invalid input(s): POCBNP CBG: Recent Labs  Lab 07/10/19 0700 07/10/19 1058 07/10/19 1621 07/10/19 2113 07/11/19 0622  GLUCAP 77 85 151* 223* 141*   D-Dimer No results for input(s): DDIMER in the last 72 hours. Hgb A1c No results for input(s): HGBA1C in the last 72 hours. Lipid Profile No results for input(s): CHOL, HDL, LDLCALC, TRIG, CHOLHDL, LDLDIRECT in the last 72 hours. Thyroid function studies No results for input(s): TSH, T4TOTAL, T3FREE, THYROIDAB in the last 72 hours.  Invalid input(s): FREET3 Anemia work up No results for input(s): VITAMINB12, FOLATE, FERRITIN, TIBC, IRON, RETICCTPCT in the last 72 hours. Urinalysis    Component Value Date/Time   COLORURINE YELLOW 08/17/2015 1621   APPEARANCEUR CLEAR 08/17/2015 1621   LABSPEC 1.015 08/17/2015 1621   PHURINE 5.5 08/17/2015 1621   GLUCOSEU NEGATIVE 08/17/2015 1621   HGBUR NEGATIVE 08/17/2015 1621   BILIRUBINUR NEGATIVE 08/17/2015 1621   KETONESUR NEGATIVE 08/17/2015 1621   PROTEINUR 100 (A) 08/17/2015 1621   UROBILINOGEN 0.2 09/05/2014  0013   NITRITE NEGATIVE 08/17/2015 1621   LEUKOCYTESUR NEGATIVE 08/17/2015 1621   Sepsis Labs Invalid input(s): PROCALCITONIN,  WBC,  LACTICIDVEN Microbiology Recent Results (from the past 240 hour(s))  SARS Coronavirus 2 by RT PCR (hospital order, performed in [facility] hospital lab) Nasopharyngeal Nasopharyngeal Swab     Status: None   Collection Time: 07/09/19  8:19 PM   Specimen: Nasopharyngeal Swab  Result Value Ref Range Status   SARS Coronavirus 2 NEGATIVE NEGATIVE Final    Comment: (NOTE) SARS-CoV-2 target nucleic acids are NOT DETECTED. The SARS-CoV-2 RNA is generally detectable in upper and lower respiratory specimens during the acute phase of infection. The lowest concentration of SARS-CoV-2 viral copies this assay can detect is 250 copies / mL. A negative result does not preclude SARS-CoV-2 infection and should not be used as the  sole basis for treatment or other patient management decisions.  A negative result may occur with improper specimen collection / handling, submission of specimen other than nasopharyngeal swab, presence of viral mutation(s) within the areas targeted by this assay, and inadequate number of viral copies (<250 copies / mL). A negative result must be combined with clinical observations, patient history, and epidemiological information. Fact Sheet for Patients:   StrictlyIdeas.no Fact Sheet for Healthcare Providers: BankingDealers.co.za This test is not yet approved or cleared  by the [place] FDA and has been authorized for detection and/or diagnosis of SARS-CoV-2 by FDA under an Emergency Use Authorization (EUA).  This EUA will remain in effect (meaning this test can be used) for the duration of the COVID-19 declaration under Section 564(b)(1) of the Act, 21 U.S.C. section 360bbb-3(b)(1), unless the authorization is terminated or revoked sooner. Performed at [facility], [address] [place], [place] [address]   Culture, blood (routine x 2)     Status: None (Preliminary result)   Collection Time: 07/09/19  9:05 PM   Specimen: BLOOD RIGHT ARM  Result Value Ref Range Status   Specimen Description   Final    BLOOD RIGHT ARM Performed at [facility], [address] [place], [place] [address]    Special Requests   Final    BOTTLES DRAWN AEROBIC AND ANAEROBIC Blood Culture adequate volume Performed at [facility], [address], [place], [place] [address]    Culture   Final    NO GROWTH 1 DAY Performed at [facility], [place] [address], [place], [place] [address]    Report Status PENDING  Incomplete  Culture, blood (routine x 2)     Status: None (Preliminary result)   Collection Time: 07/09/19  9:10 PM   Specimen: BLOOD RIGHT HAND  Result Value Ref Range Status   Specimen Description    Final    BLOOD RIGHT HAND Performed at [facility], [address] [place], [place] [address]    Special Requests   Final    BOTTLES DRAWN AEROBIC AND ANAEROBIC Blood Culture adequate volume Performed at [facility], [address], [place], [place] [address]    Culture   Final    NO GROWTH 1 DAY Performed at [facility], [place] [address] [place], [place] [address]    Report Status PENDING  Incomplete  MRSA culture     Status: None (Preliminary result)   Collection Time: 07/10/19 10:47 AM   Specimen: Body Fluid  Result Value Ref Range Status   Specimen Description  FLUID  Final   Special Requests BODY FLUID  Final   Culture   Final    NO GROWTH < 24 HOURS Performed at [facility], [place] [address] [place], [place] [address]    Report Status PENDING  Incomplete     Time coordinating discharge: Over 30 minutes  SIGNED:   Darliss Cheney, MD  [facility] 07/11/2019, 10:32 AM  If 7PM-7AM, please contact night-coverage [email or web address]

## 2019-07-13 LAB — MRSA CULTURE: Culture: NOT DETECTED

## 2019-07-15 LAB — CULTURE, BLOOD (ROUTINE X 2)
Culture: NO GROWTH
Culture: NO GROWTH
Special Requests: ADEQUATE
Special Requests: ADEQUATE

## 2019-11-09 ENCOUNTER — Other Ambulatory Visit: Payer: Self-pay

## 2019-11-09 ENCOUNTER — Encounter (HOSPITAL_COMMUNITY): Payer: Self-pay

## 2019-11-09 ENCOUNTER — Emergency Department (HOSPITAL_COMMUNITY): Payer: Medicare PPO

## 2019-11-09 ENCOUNTER — Emergency Department (HOSPITAL_COMMUNITY)
Admission: EM | Admit: 2019-11-09 | Discharge: 2019-11-10 | Disposition: A | Discharge status: Home / Self Care | Payer: Medicare PPO | Attending: Emergency Medicine | Admitting: Emergency Medicine

## 2019-11-09 DIAGNOSIS — R0602 Shortness of breath: Secondary | ICD-10-CM | POA: Insufficient documentation

## 2019-11-09 DIAGNOSIS — Z96652 Presence of left artificial knee joint: Secondary | ICD-10-CM | POA: Diagnosis not present

## 2019-11-09 DIAGNOSIS — R079 Chest pain, unspecified: Secondary | ICD-10-CM | POA: Diagnosis not present

## 2019-11-09 DIAGNOSIS — Z79899 Other long term (current) drug therapy: Secondary | ICD-10-CM | POA: Diagnosis not present

## 2019-11-09 DIAGNOSIS — M25512 Pain in left shoulder: Secondary | ICD-10-CM | POA: Insufficient documentation

## 2019-11-09 DIAGNOSIS — Z20822 Contact with and (suspected) exposure to covid-19: Secondary | ICD-10-CM | POA: Diagnosis not present

## 2019-11-09 DIAGNOSIS — M542 Cervicalgia: Secondary | ICD-10-CM | POA: Diagnosis not present

## 2019-11-09 DIAGNOSIS — Z7951 Long term (current) use of inhaled steroids: Secondary | ICD-10-CM | POA: Insufficient documentation

## 2019-11-09 DIAGNOSIS — F17210 Nicotine dependence, cigarettes, uncomplicated: Secondary | ICD-10-CM | POA: Insufficient documentation

## 2019-11-09 DIAGNOSIS — I120 Hypertensive chronic kidney disease with stage 5 chronic kidney disease or end stage renal disease: Secondary | ICD-10-CM | POA: Diagnosis not present

## 2019-11-09 DIAGNOSIS — N186 End stage renal disease: Secondary | ICD-10-CM | POA: Diagnosis not present

## 2019-11-09 DIAGNOSIS — E1122 Type 2 diabetes mellitus with diabetic chronic kidney disease: Secondary | ICD-10-CM | POA: Insufficient documentation

## 2019-11-09 DIAGNOSIS — Z992 Dependence on renal dialysis: Secondary | ICD-10-CM | POA: Diagnosis not present

## 2019-11-09 DIAGNOSIS — J449 Chronic obstructive pulmonary disease, unspecified: Secondary | ICD-10-CM | POA: Diagnosis not present

## 2019-11-09 LAB — BASIC METABOLIC PANEL
Anion gap: 17 — ABNORMAL HIGH (ref 5–15)
BUN: 12 mg/dL (ref 6–20)
CO2: 23 mmol/L (ref 22–32)
Calcium: 8.9 mg/dL (ref 8.9–10.3)
Chloride: 94 mmol/L — ABNORMAL LOW (ref 98–111)
Creatinine, Ser: 3.52 mg/dL — ABNORMAL HIGH (ref 0.44–1.00)
GFR calc Af Amer: 16 mL/min — ABNORMAL LOW (ref >60)
GFR calc non Af Amer: 13 mL/min — ABNORMAL LOW (ref >60)
Glucose, Bld: 248 mg/dL — ABNORMAL HIGH (ref 70–99)
Potassium: 3.6 mmol/L (ref 3.5–5.1)
Sodium: 134 mmol/L — ABNORMAL LOW (ref 135–145)

## 2019-11-09 LAB — CBC
HCT: 35.3 % — ABNORMAL LOW (ref 36.0–46.0)
Hemoglobin: 11.1 g/dL — ABNORMAL LOW (ref 12.0–15.0)
MCH: 31.7 pg (ref 26.0–34.0)
MCHC: 31.4 g/dL (ref 30.0–36.0)
MCV: 100.9 fL — ABNORMAL HIGH (ref 80.0–100.0)
Platelets: 282 10*3/uL (ref 150–400)
RBC: 3.5 MIL/uL — ABNORMAL LOW (ref 3.87–5.11)
RDW: 16.6 % — ABNORMAL HIGH (ref 11.5–15.5)
WBC: 7.1 10*3/uL (ref 4.0–10.5)
nRBC: 0 % (ref 0.0–0.2)

## 2019-11-09 LAB — CBG MONITORING, ED: Glucose-Capillary: 127 mg/dL — ABNORMAL HIGH (ref 70–99)

## 2019-11-09 LAB — TROPONIN I (HIGH SENSITIVITY): Troponin I (High Sensitivity): 10 ng/L (ref <18)

## 2019-11-09 NOTE — ED Triage Notes (Signed)
Patient complains of CP/SOB while receiving dialysis today. Reports that she has had same in the past. Patient alert and oriented, states pain more in left shoulder.

## 2019-11-10 ENCOUNTER — Emergency Department (HOSPITAL_BASED_OUTPATIENT_CLINIC_OR_DEPARTMENT_OTHER)
Admission: EM | Admit: 2019-11-10 | Discharge: 2019-11-10 | Disposition: A | Discharge status: Home / Self Care | Payer: Medicare PPO | Source: Home / Self Care | Attending: Emergency Medicine | Admitting: Emergency Medicine

## 2019-11-10 ENCOUNTER — Emergency Department (HOSPITAL_BASED_OUTPATIENT_CLINIC_OR_DEPARTMENT_OTHER): Payer: Medicare PPO

## 2019-11-10 ENCOUNTER — Other Ambulatory Visit: Payer: Self-pay

## 2019-11-10 ENCOUNTER — Encounter (HOSPITAL_BASED_OUTPATIENT_CLINIC_OR_DEPARTMENT_OTHER): Payer: Self-pay | Admitting: *Deleted

## 2019-11-10 DIAGNOSIS — Z7951 Long term (current) use of inhaled steroids: Secondary | ICD-10-CM | POA: Insufficient documentation

## 2019-11-10 DIAGNOSIS — I120 Hypertensive chronic kidney disease with stage 5 chronic kidney disease or end stage renal disease: Secondary | ICD-10-CM | POA: Insufficient documentation

## 2019-11-10 DIAGNOSIS — R0602 Shortness of breath: Secondary | ICD-10-CM | POA: Insufficient documentation

## 2019-11-10 DIAGNOSIS — Z20822 Contact with and (suspected) exposure to covid-19: Secondary | ICD-10-CM | POA: Insufficient documentation

## 2019-11-10 DIAGNOSIS — F17210 Nicotine dependence, cigarettes, uncomplicated: Secondary | ICD-10-CM | POA: Insufficient documentation

## 2019-11-10 DIAGNOSIS — Z992 Dependence on renal dialysis: Secondary | ICD-10-CM | POA: Insufficient documentation

## 2019-11-10 DIAGNOSIS — Z79899 Other long term (current) drug therapy: Secondary | ICD-10-CM | POA: Insufficient documentation

## 2019-11-10 DIAGNOSIS — Z96652 Presence of left artificial knee joint: Secondary | ICD-10-CM | POA: Insufficient documentation

## 2019-11-10 DIAGNOSIS — M542 Cervicalgia: Secondary | ICD-10-CM | POA: Insufficient documentation

## 2019-11-10 DIAGNOSIS — E1122 Type 2 diabetes mellitus with diabetic chronic kidney disease: Secondary | ICD-10-CM | POA: Insufficient documentation

## 2019-11-10 DIAGNOSIS — M25512 Pain in left shoulder: Secondary | ICD-10-CM | POA: Insufficient documentation

## 2019-11-10 DIAGNOSIS — J449 Chronic obstructive pulmonary disease, unspecified: Secondary | ICD-10-CM | POA: Insufficient documentation

## 2019-11-10 DIAGNOSIS — N186 End stage renal disease: Secondary | ICD-10-CM | POA: Insufficient documentation

## 2019-11-10 LAB — RESPIRATORY PANEL BY RT PCR (FLU A&B, COVID)
Influenza A by PCR: NEGATIVE
Influenza B by PCR: NEGATIVE
SARS Coronavirus 2 by RT PCR: NEGATIVE

## 2019-11-10 LAB — TROPONIN I (HIGH SENSITIVITY): Troponin I (High Sensitivity): 8 ng/L (ref <18)

## 2019-11-10 MED ORDER — IOHEXOL 350 MG/ML SOLN
100.0000 mL | Freq: Once | INTRAVENOUS | Status: AC | PRN
  Administered 2019-11-10: 100 mL via INTRAVENOUS

## 2019-11-10 NOTE — Discharge Instructions (Signed)
Your CT scan did not show a blood clot in the lung or pneumonia. This could be related so the discomfort you're having with your neck spasms. Please follow-up with your family doctor.

## 2019-11-10 NOTE — ED Notes (Signed)
Pt called to be roomed, no response 

## 2019-11-10 NOTE — ED Triage Notes (Addendum)
SOB since yesterday while at dialysis. She was sent to [facility] with oxygen sats 100% but left due to long wait. She went to [facility] this am with feeling of SOB. She was stable but was told to come to the ED. Oxygen sats 100% at triage. She is speaking in complete sentences. Lungs clear. Her CXR yesterday was normal.

## 2019-11-10 NOTE — ED Provider Notes (Signed)
[facility] EMERGENCY DEPARTMENT Provider Note   CSN: [id number] Arrival date & time: 11/10/19  1121     History Chief Complaint  Patient presents with  . Shortness of Breath    Jodi Bell is a 59 y.o. female.  59 yo F with a chief complaint of shortness of breath and pain in her left shoulder.  This is been off and on since yesterday.  Occurred during our 4 of dialysis.  They felt she was over her dry weight and so had increased her time by an hour.  She said that when she got this pain in her shoulder it made her breathing a bit worse.  Last for couple minutes and then resolved.  This happened again yesterday when she was in the emergency department waiting to be seen.  Again lasted for couple minutes and resolved as the spasms resolved.  She has been having chronic spasms to the base of her neck and she is on medication for that.  She had some transient chest pain to the left side of her chest occurred while she was in the waiting room last night but is resolved.  Denied any exertional symptoms.  Denied history of PE or DVT denied hemoptysis denies unilateral lower extremity edema.  She was just in the hospital about 3 weeks ago for a hypoglycemic episode per her.  She states compliance with her dialysis.  The history is provided by the patient.  Shortness of Breath Severity:  Moderate Onset quality:  Gradual Duration:  2 days Timing:  Intermittent Progression:  Waxing and waning Chronicity:  New Relieved by:  Nothing Worsened by:  Nothing Ineffective treatments:  None tried Associated symptoms: neck pain (chronic spasms)   Associated symptoms: no chest pain, no fever, no headaches, no vomiting and no wheezing        Past Medical History:  Diagnosis Date  . Anemia   . Arthritis    oa and psoriatic ra  . Asthma   . Back pain, chronic    lower back  . Candida infection finished tx 2 days ago  . Chronic insomnia   . Chronic kidney disease    stage 3 ckd  last ov nephrology 04-29-17 epic  . Chronic kidney disease (CKD) stage G4/A1, severely decreased glomerular filtration rate (GFR) between 15-29 mL/min/1.73 square meter and albuminuria creatinine ratio less than 30 mg/g (HCC)   . COPD (chronic obstructive pulmonary disease) ([place])   . Diabetes mellitus    type 2  . Eczema   . Essential hypertension   . GERD (gastroesophageal reflux disease)   . History of hiatal hernia   . Obesity   . Pneumonia few yrs ago x 2  . Sleep apnea     Patient Active Problem List   Diagnosis Date Noted  . GI bleeding 05/17/2019  . Volume overload state of heart 09/14/2018  . Intervertebral cervical disc disorder with myelopathy, cervical region 09/14/2018  . OSA (obstructive sleep apnea) 09/14/2018  . Asthma 05/16/2018  . Suspected COVID-19 virus infection 05/16/2018  . ESRD (end stage renal disease) ([place]) 05/16/2018  . Fluid overload 02/27/2018  . Chest pain, rule out acute myocardial infarction 12/12/2017  . Status post left partial knee replacement 11/12/2017  . Hematochezia 09/20/2017  . GERD (gastroesophageal reflux disease) 09/20/2017  . 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 2 diabetes mellitus ([place]) 09/20/2017  . COPD (chronic obstructive pulmonary disease) ([place]) 09/20/2017  . Tobacco use 09/20/2017  . Symptomatic anemia 09/20/2017  .  Headache 09/20/2017  . Serotonin syndrome 09/28/2013  . Acute respiratory failure with hypoxia ([place]) 09/28/2013  . Encephalopathy acute 09/28/2013  . Hypotension, unspecified 09/28/2013  . 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 Type 2 diabetes mellitus ([place]) 03/06/2008  . INSOMNIA, CHRONIC 03/06/2008  . Essential hypertension 03/06/2008  . ASTHMA 03/06/2008  . ECZEMA 03/06/2008  . BACK PAIN, LUMBAR, CHRONIC 03/06/2008    Past Surgical History:  Procedure Laterality Date  . BACK SURGERY  2016   lower back fusion with cage  . BIOPSY  09/23/2017   Procedure: BIOPSY;  Surgeon: Wonda Horner, MD;  Location: [facility]  ENDOSCOPY;  Service: Endoscopy;;  . BIOPSY  05/19/2019   Procedure: BIOPSY;  Surgeon: Otis Brace, MD;  Location: [facility];  Service: Gastroenterology;;  . CESAREAN SECTION  1990   x 1   . COLONOSCOPY WITH PROPOFOL N/A 09/23/2017   Procedure: COLONOSCOPY WITH PROPOFOL Hemostatic clips placed;  Surgeon: Wonda Horner, MD;  Location: [facility] ENDOSCOPY;  Service: Endoscopy;  Laterality: N/A;  . DILATION AND CURETTAGE OF UTERUS  1988  . ESOPHAGOGASTRODUODENOSCOPY (EGD) WITH PROPOFOL N/A 09/23/2017   Procedure: ESOPHAGOGASTRODUODENOSCOPY (EGD) WITH PROPOFOL;  Surgeon: Wonda Horner, MD;  Location: [facility] ENDOSCOPY;  Service: Endoscopy;  Laterality: N/A;  . ESOPHAGOGASTRODUODENOSCOPY (EGD) WITH PROPOFOL N/A 05/19/2019   Procedure: ESOPHAGOGASTRODUODENOSCOPY (EGD) WITH PROPOFOL;  Surgeon: Otis Brace, MD;  Location: [facility] ENDOSCOPY;  Service: Gastroenterology;  Laterality: N/A;  . GIVENS CAPSULE STUDY N/A 05/19/2019   Procedure: GIVENS CAPSULE STUDY;  Surgeon: Otis Brace, MD;  Location: [facility];  Service: Gastroenterology;  Laterality: N/A;  . HOT HEMOSTASIS N/A 05/19/2019   Procedure: HOT HEMOSTASIS (ARGON PLASMA COAGULATION/BICAP);  Surgeon: Otis Brace, MD;  Location[facility] ENDOSCOPY;  Service: Gastroenterology;  Laterality: N/A;  . PARTIAL KNEE ARTHROPLASTY Left 11/12/2017   Procedure: left unicompartmental arthroplasty-medial;  Surgeon: Paralee Cancel, MD;  Location: [facility] ORS;  Service: Orthopedics;  Laterality: Left;  40min  . SUBMUCOSAL INJECTION  09/23/2017   Procedure: SUBMUCOSAL INJECTION;  Surgeon: Wonda Horner, MD;  Location: [facility] ENDOSCOPY;  Service: Endoscopy;;  in colon  . TUBAL LIGATION  1990     OB History   No obstetric history on file.     Family History  Problem Relation Age of Onset  . Lung cancer Mother   . Diabetes Sister   . Heart disease Sister   . Asthma Daughter   . Arthritis Daughter   . Arthritis Daughter   . Thyroid cancer Other        1/2 sister  .  Heart disease Other        1/2 sister    Social History   Tobacco Use  . Smoking status: Current Some Day Smoker    Packs/day: 0.50    Years: 40.00    Pack years: 20.00    Types: Cigarettes    Last attempt to quit: 05/15/2018    Years since quitting: 1.4  . Smokeless tobacco: Never Used  Vaping Use  . Vaping Use: Never used  Substance Use Topics  . Alcohol use: Yes    Comment: occ  . Drug use: No    Home Medications Prior to Admission medications   Medication Sig Start Date End Date Taking? Authorizing Provider  acetaminophen (TYLENOL) 500 MG tablet Take 1,000 mg by mouth every 6 (six) hours as needed for mild pain.    [provider]  allopurinol (ZYLOPRIM) 100 MG tablet Take 100 mg by mouth every evening.  [provider]  amLODipine (NORVASC) 10 MG tablet Take 10 mg by mouth daily.    [provider]  budesonide-formoterol (SYMBICORT) 160-4.5 MCG/ACT inhaler Inhale 2 puffs into the lungs 2 (two) times daily.    [provider]  calcium acetate (PHOSLO) 667 MG capsule Take 3 capsules by mouth in the morning and at bedtime. 10/25/18   [provider]  carboxymethylcellulose 1 % ophthalmic solution Place 1 drop into both eyes 3 (three) times daily.    [provider]  cetirizine (ZYRTEC) 10 MG tablet Take 10 mg by mouth 2 (two) times daily as needed (hives).  06/13/17   Shelda Pal, DO  cinacalcet (SENSIPAR) 30 MG tablet Take 30 mg by mouth daily.    [provider]  cloNIDine (CATAPRES) 0.3 MG tablet Take 1 tablet (0.3 mg total) by mouth 3 (three) times daily. 05/21/19   Hosie Poisson, MD  ELDERBERRY PO Take 2 drops by mouth daily.    [provider]  EPINEPHrine (EPIPEN) 0.3 mg/0.3 mL SOAJ injection Inject 0.3 mLs (0.3 mg total) into the muscle once. Patient taking differently: Inject 0.3 mg into the muscle as needed (for allergic reaction).  09/30/12   Le, Thao P, DO  esomeprazole (NEXIUM) 20 MG  capsule Take 2 capsules (40 mg total) by mouth daily. 05/21/19   Hosie Poisson, MD  ferrous sulfate 325 (65 FE) MG tablet Take 325 mg by mouth 3 (three) times a week. TUE THUR SAT    [provider]  fluticasone (FLONASE) 50 MCG/ACT nasal spray Place 1 spray into both nostrils daily.     [provider]  folic acid (FOLVITE) 778 MCG tablet Take 400 mcg by mouth daily.     [provider]  furosemide (LASIX) 40 MG tablet Take 40 mg by mouth 2 (two) times daily.     [provider]  gabapentin (NEURONTIN) 300 MG capsule Take 1 capsule (300 mg total) by mouth at bedtime as needed (Neuropathic pain). Patient taking differently: Take 300 mg by mouth daily with supper.  12/14/17 05/18/19  Dana Allan I, MD  hydrALAZINE (APRESOLINE) 50 MG tablet Take 1 tablet (50 mg total) by mouth every 8 (eight) hours. Patient taking differently: Take 100 mg by mouth every 8 (eight) hours.  12/14/17   Bonnell Public, MD  isosorbide mononitrate (IMDUR) 60 MG 24 hr tablet Take 1 tablet (60 mg total) by mouth daily. 03/02/18   Thurnell Lose, MD  JANUVIA 25 MG tablet Take 25 mg by mouth daily.  08/09/15   [provider]  labetalol (NORMODYNE) 200 MG tablet Take 400 mg by mouth 3 (three) times daily.    [provider]  LORazepam (ATIVAN) 0.5 MG tablet Take 0.5 mg by mouth every 8 (eight) hours as needed for anxiety. 05/02/19   [provider]  methocarbamol (ROBAXIN) 500 MG tablet Take 500 mg by mouth 4 (four) times daily as needed for muscle pain. neck 05/01/19   [provider]  montelukast (SINGULAIR) 10 MG tablet Take 10 mg by mouth daily with supper.     [provider]  multivitamin (RENA-VIT) TABS tablet Take 1 tablet by mouth daily with supper.    [provider]  olmesartan (BENICAR) 40 MG tablet Take 40 mg by mouth daily. 05/06/19   [provider]  Omega-3 1000 MG CAPS Take 1,000 mg by mouth in the morning, at  noon, and at bedtime.    [provider]  ondansetron (ZOFRAN) 4 MG tablet Take 1 tablet (4 mg total) by mouth every 8 (eight) hours as needed for nausea or vomiting. 05/21/19 05/20/20  Hosie Poisson, MD  Oxycodone HCl 10 MG TABS Take 1 tablet (10 mg total) by mouth every 6 (six) hours as needed (pain score 7-10). 09/15/18   Black, Lezlie Octave, NP  predniSONE (DELTASONE) 5 MG tablet Take 5 mg by mouth daily with breakfast.     [provider]  Probiotic Product (PROBIOTIC PO) Take 1 capsule by mouth daily.    [provider]  senna (SENOKOT) 8.6 MG tablet Take 1-2 tablets by mouth daily as needed for constipation.    [provider]  Tiotropium Bromide Monohydrate (SPIRIVA RESPIMAT IN) Inhale 2 puffs into the lungs daily.    [provider]  VENTOLIN HFA 108 (90 Base) MCG/ACT inhaler Inhale 1-2 puffs into the lungs every 6 (six) hours as needed for shortness of breath. wheezing 06/13/15   [provider]  XELJANZ 5 MG TABS Take 1 tablet by mouth at bedtime. 05/17/19   [provider]  zolpidem (AMBIEN) 5 MG tablet Take 5 mg by mouth at bedtime. 05/05/19   [provider]    Allergies    Banana, Black walnut flavor, Hazelnut (filbert) allergy skin test, No healthtouch food allergies, Pecan nut (diagnostic), Adalimumab, Ezetimibe, Ibuprofen, Leflunomide and related, Lexapro [escitalopram oxalate], Lisinopril, Secukinumab, Statins, and Trazodone and nefazodone  Review of Systems   Review of Systems  Constitutional: Negative for chills and fever.  HENT: Negative for congestion and rhinorrhea.   Eyes: Negative for redness and visual disturbance.  Respiratory: Positive for shortness of breath. Negative for wheezing.   Cardiovascular: Negative for chest pain and palpitations.  Gastrointestinal: Negative for nausea and vomiting.  Genitourinary: Negative for dysuria and urgency.  Musculoskeletal: Positive for neck pain (chronic spasms).  Negative for arthralgias and myalgias.  Skin: Negative for pallor and wound.  Neurological: Negative for dizziness and headaches.    Physical Exam Updated Vital Signs BP 116/67 (BP Location: Left Arm)   Pulse 76   Temp 98.9 F (37.2 C) (Oral)   Resp 16   Ht 5\' 2"  (1.575 m)   Wt 72.6 kg   SpO2 100%   BMI 29.26 kg/m   Physical Exam Vitals and nursing note reviewed.  Constitutional:      General: She is not in acute distress.    Appearance: She is well-developed. She is obese. She is not diaphoretic.  HENT:     Head: Normocephalic and atraumatic.     Comments: Swollen turbinates, posterior nasal drip, no noted sinus ttp, tm normal bilaterally.   Eyes:     Pupils: Pupils are equal, round, and reactive to light.  Cardiovascular:     Rate and Rhythm: Normal rate and regular rhythm.     Heart sounds: No murmur heard.  No friction rub. No gallop.   Pulmonary:     Effort: Pulmonary effort is normal.     Breath sounds: No wheezing or rales.  Abdominal:     General: There is no distension.     Palpations: Abdomen is soft.     Tenderness: There is no abdominal tenderness.  Musculoskeletal:        General: No tenderness.     Cervical back: Normal range of motion and neck supple.  Skin:    General: Skin is warm and dry.  Neurological:     Mental Status: She is alert and oriented to  person, place, and time.  Psychiatric:        Behavior: Behavior normal.     ED Results / Procedures / Treatments   Labs (all labs ordered are listed, but only abnormal results are displayed) Labs Reviewed  RESPIRATORY PANEL BY RT PCR (FLU A&B, COVID)  TROPONIN I (HIGH SENSITIVITY)    EKG None  Radiology DG Chest 2 View  Result Date: 11/09/2019 CLINICAL DATA:  Chest pain EXAM: CHEST - 2 VIEW COMPARISON:  October 17, 2019 FINDINGS: Lungs are clear. Heart is mildly enlarged with pulmonary vascularity normal. No adenopathy. There is aortic atherosclerosis. There is postoperative change in  the lower cervical region as well as in the mid lumbar region. IMPRESSION: Lungs clear.  Heart mildly enlarged.  No adenopathy. Aortic Atherosclerosis (ICD10-I70.0). Electronically Signed   By: Lowella Grip III M.D.   On: 11/09/2019 12:28   CT Angio Chest PE W and/or Wo Contrast  Result Date: 11/10/2019 CLINICAL DATA:  Shortness of breath EXAM: CT ANGIOGRAPHY CHEST WITH CONTRAST TECHNIQUE: Multidetector CT imaging of the chest was performed using the standard protocol during bolus administration of intravenous contrast. Multiplanar CT image reconstructions and MIPs were obtained to evaluate the vascular anatomy. CONTRAST:  114mL OMNIPAQUE IOHEXOL 350 MG/ML SOLN COMPARISON:  Chest radiograph November 09, 2019. Chest CT August 10, 2018 FINDINGS: Cardiovascular: There is no demonstrable pulmonary embolus. There is no appreciable thoracic aortic aneurysm or dissection. There are foci of aortic atherosclerosis. There are foci of coronary artery calcification at multiple sites. There is no pericardial thickening. Minimal pericardial fluid is within physiologic range. Heart is mildly enlarged. Main pulmonary outflow tract measures 3.3 cm in diameter, prominent. Mediastinum/Nodes: Visualized thyroid appears normal. There is a stable precarinal lymph node measuring 1.2 x 1.1 cm, stable. There is a stable lymph node lateral to the distal trachea measuring 1.4 x 1.4 cm. There is a subcarinal lymph node measuring 1.9 x 1.5 cm, smaller than on previous study. No new lymph node enlargement evident. No esophageal lesion evident. Lungs/Pleura: There is no edema or airspace opacity. There is bibasilar atelectatic change. A previously noted nodular opacity in the posterior right base is not appreciable currently. A previously noted nodular opacity in the posterior left upper lobe region not appreciable. Upper Abdomen: Visualized upper abdominal structures appear unremarkable. Musculoskeletal: No blastic or lytic bone lesions.  No evident chest wall lesions. Postoperative change incompletely visualized in the lower cervical spine region. Review of the MIP images confirms the above findings. IMPRESSION: 1. No evident pulmonary embolus. No thoracic aortic aneurysm or dissection. There are foci of aortic atherosclerosis. There are foci of coronary artery calcification. There is mild cardiomegaly. 2. Prominence of the main pulmonary outflow tract is indicative of a degree of pulmonary arterial hypertension. 3.  Bibasilar atelectasis.  No edema or airspace opacity. 4. Areas of adenopathy either stable or slightly less prominent than on previous study. Etiology for adenopathy uncertain. Aortic Atherosclerosis (ICD10-I70.0). Electronically Signed   By: Lowella Grip III M.D.   On: 11/10/2019 16:41    Procedures Procedures (including critical care time)  Medications Ordered in ED Medications  iohexol (OMNIPAQUE) 350 MG/ML injection 100 mL (100 mLs Intravenous Contrast Given 11/10/19 1621)    ED Course  I have reviewed the triage vital signs and the nursing notes.  Pertinent labs & imaging results that were available during my care of the patient were reviewed by me and considered in my medical decision making (see chart for details).  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  It sounds like her shortness of breath is most likely related to her neck spasms that seem to be consistent with the timing.  She is however at a higher risk for PE as she was just in the hospital.  She also is likely to have an elevated D-dimer with her having a chronic infection.  CT scan of the chest without PE or occult pneumonia. Delta Lance Bosch is negative. Will discharge the patient home. PCP and dialysis follow-up.  5:12 PM:  I have discussed the  diagnosis/risks/treatment options with the patient and believe the pt to be eligible for discharge home to follow-up with PCP, nephrology. We also discussed returning to the ED immediately if new or worsening sx occur. We discussed the sx which are most concerning (e.g., sudden worsening pain, fever, inability to tolerate by mouth) that necessitate immediate return. Medications administered to the patient during their visit and any new prescriptions provided to the patient are listed below.  Medications given during this visit Medications  iohexol (OMNIPAQUE) 350 MG/ML injection 100 mL (100 mLs Intravenous Contrast Given 11/10/19 1621)     The patient appears reasonably screen and/or stabilized for discharge and I doubt any other medical condition or other [facility] requiring further screening, evaluation, or treatment in the ED at this time prior to discharge.   Final Clinical Impression(s) / ED Diagnoses Final diagnoses:  Shortness of breath    Rx / DC Orders ED Discharge Orders    None       Deno Etienne, DO 11/10/19 1712

## 2019-11-10 NOTE — ED Notes (Addendum)
Sent to [facility] yesterday after dialysis  For sob after they started her on her 4 th hour , was seen at [facility] this am and then sent here ,   M W F  This am felt the same .   States felt different than fluid overload , but her pulse ox is always 100 % , states these sob episodes are accompied by neck spasms she states

## 2019-12-15 ENCOUNTER — Emergency Department (HOSPITAL_BASED_OUTPATIENT_CLINIC_OR_DEPARTMENT_OTHER): Payer: Medicare PPO

## 2019-12-15 ENCOUNTER — Encounter (HOSPITAL_BASED_OUTPATIENT_CLINIC_OR_DEPARTMENT_OTHER): Payer: Self-pay

## 2019-12-15 ENCOUNTER — Other Ambulatory Visit: Payer: Self-pay

## 2019-12-15 ENCOUNTER — Emergency Department (HOSPITAL_BASED_OUTPATIENT_CLINIC_OR_DEPARTMENT_OTHER)
Admission: EM | Admit: 2019-12-15 | Discharge: 2019-12-15 | Disposition: A | Discharge status: Home / Self Care | Payer: Medicare PPO | Attending: Emergency Medicine | Admitting: Emergency Medicine

## 2019-12-15 DIAGNOSIS — Z7951 Long term (current) use of inhaled steroids: Secondary | ICD-10-CM | POA: Diagnosis not present

## 2019-12-15 DIAGNOSIS — Z8616 Personal history of COVID-19: Secondary | ICD-10-CM | POA: Insufficient documentation

## 2019-12-15 DIAGNOSIS — I120 Hypertensive chronic kidney disease with stage 5 chronic kidney disease or end stage renal disease: Secondary | ICD-10-CM | POA: Insufficient documentation

## 2019-12-15 DIAGNOSIS — Z79899 Other long term (current) drug therapy: Secondary | ICD-10-CM | POA: Insufficient documentation

## 2019-12-15 DIAGNOSIS — I959 Hypotension, unspecified: Secondary | ICD-10-CM | POA: Diagnosis not present

## 2019-12-15 DIAGNOSIS — N186 End stage renal disease: Secondary | ICD-10-CM | POA: Insufficient documentation

## 2019-12-15 DIAGNOSIS — Z96652 Presence of left artificial knee joint: Secondary | ICD-10-CM | POA: Insufficient documentation

## 2019-12-15 DIAGNOSIS — Z992 Dependence on renal dialysis: Secondary | ICD-10-CM | POA: Insufficient documentation

## 2019-12-15 DIAGNOSIS — J449 Chronic obstructive pulmonary disease, unspecified: Secondary | ICD-10-CM | POA: Diagnosis not present

## 2019-12-15 DIAGNOSIS — Z7984 Long term (current) use of oral hypoglycemic drugs: Secondary | ICD-10-CM | POA: Insufficient documentation

## 2019-12-15 DIAGNOSIS — F17210 Nicotine dependence, cigarettes, uncomplicated: Secondary | ICD-10-CM | POA: Diagnosis not present

## 2019-12-15 DIAGNOSIS — E1122 Type 2 diabetes mellitus with diabetic chronic kidney disease: Secondary | ICD-10-CM | POA: Insufficient documentation

## 2019-12-15 DIAGNOSIS — R55 Syncope and collapse: Secondary | ICD-10-CM | POA: Insufficient documentation

## 2019-12-15 LAB — TROPONIN I (HIGH SENSITIVITY)
Troponin I (High Sensitivity): 12 ng/L (ref <18)
Troponin I (High Sensitivity): 11 ng/L (ref <18)

## 2019-12-15 LAB — CBC
HCT: 38.1 % (ref 36.0–46.0)
Hemoglobin: 12.2 g/dL (ref 12.0–15.0)
MCH: 32.2 pg (ref 26.0–34.0)
MCHC: 32 g/dL (ref 30.0–36.0)
MCV: 100.5 fL — ABNORMAL HIGH (ref 80.0–100.0)
Platelets: 250 10*3/uL (ref 150–400)
RBC: 3.79 MIL/uL — ABNORMAL LOW (ref 3.87–5.11)
RDW: 15.2 % (ref 11.5–15.5)
WBC: 6.3 10*3/uL (ref 4.0–10.5)
nRBC: 0 % (ref 0.0–0.2)

## 2019-12-15 LAB — BASIC METABOLIC PANEL
Anion gap: 18 — ABNORMAL HIGH (ref 5–15)
BUN: 25 mg/dL — ABNORMAL HIGH (ref 6–20)
CO2: 22 mmol/L (ref 22–32)
Calcium: 10.2 mg/dL (ref 8.9–10.3)
Chloride: 92 mmol/L — ABNORMAL LOW (ref 98–111)
Creatinine, Ser: 7.31 mg/dL — ABNORMAL HIGH (ref 0.44–1.00)
GFR, Estimated: 6 mL/min — ABNORMAL LOW (ref >60)
Glucose, Bld: 137 mg/dL — ABNORMAL HIGH (ref 70–99)
Potassium: 4.3 mmol/L (ref 3.5–5.1)
Sodium: 132 mmol/L — ABNORMAL LOW (ref 135–145)

## 2019-12-15 LAB — CBG MONITORING, ED: Glucose-Capillary: 152 mg/dL — ABNORMAL HIGH (ref 70–99)

## 2019-12-15 MED ORDER — SODIUM CHLORIDE 0.9 % IV BOLUS
500.0000 mL | Freq: Once | INTRAVENOUS | Status: AC
  Administered 2019-12-15: 500 mL via INTRAVENOUS

## 2019-12-15 NOTE — Discharge Instructions (Signed)
Your blood pressure was low and that is why you feel dizzy.  Do not take your blood pressure medicine tomorrow morning.  Please start dialysis tomorrow you need less fluid taken out  Return to ER if you have worse dizziness, chest pain, shortness of breath.

## 2019-12-15 NOTE — ED Notes (Signed)
Pt on monitor 

## 2019-12-15 NOTE — ED Notes (Signed)
Pt only produces urine once or twice a week - not able to give sample at this time.

## 2019-12-15 NOTE — ED Triage Notes (Signed)
Pt c/o near syncopal episode yesterday after dialysis and once again today-NAD-to triage in w/c

## 2019-12-15 NOTE — ED Provider Notes (Signed)
[facility] EMERGENCY DEPARTMENT Provider Note   CSN: [id number] Arrival date & time: 12/15/19  1758     History Chief Complaint  Patient presents with  . Near Syncope    Jodi Bell is a 59 y.o. female history of CKD, diabetes, hypertension, ESRD on dialysis (last dialysis was yesterday), here presenting with near syncope.  Patient states that she usually gets very lightheaded and dizzy after dialysis.  She states that yesterday she felt like she is on pass out so she got a wheelchair to go to her car.  She states that her blood pressure was low 100s at that time.  She states that this morning her blood pressures in the 140s so she did take her blood pressure medicine.  She states that this afternoon she just feels lightheaded and dizzy like she is on pass out.  Denies any chest pain or shortness of breath.  Patient states that they did not take off more fluid than usual yesterday.  Patient denies any focal weakness or trouble speaking or room spinning  The history is provided by the patient.       Past Medical History:  Diagnosis Date  . Anemia   . Arthritis    oa and psoriatic ra  . Asthma   . Back pain, chronic    lower back  . Candida infection finished tx 2 days ago  . Chronic insomnia   . Chronic kidney disease    stage 3 ckd last ov nephrology 04-29-17 epic  . Chronic kidney disease (CKD) stage G4/A1, severely decreased glomerular filtration rate (GFR) between 15-29 mL/min/1.73 square meter and albuminuria creatinine ratio less than 30 mg/g (HCC)   . COPD (chronic obstructive pulmonary disease) ([place])   . Diabetes mellitus    type 2  . Eczema   . Essential hypertension   . GERD (gastroesophageal reflux disease)   . History of hiatal hernia   . Obesity   . Pneumonia few yrs ago x 2  . Sleep apnea     Patient Active Problem List   Diagnosis Date Noted  . GI bleeding 05/17/2019  . Volume overload state of heart 09/14/2018  . Intervertebral cervical  disc disorder with myelopathy, cervical region 09/14/2018  . OSA (obstructive sleep apnea) 09/14/2018  . Asthma 05/16/2018  . Suspected COVID-19 virus infection 05/16/2018  . ESRD (end stage renal disease) ([place]) 05/16/2018  . Fluid overload 02/27/2018  . Chest pain, rule out acute myocardial infarction 12/12/2017  . Status post left partial knee replacement 11/12/2017  . Hematochezia 09/20/2017  . GERD (gastroesophageal reflux disease) 09/20/2017  . 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 2 diabetes mellitus ([place]) 09/20/2017  . COPD (chronic obstructive pulmonary disease) ([place]) 09/20/2017  . Tobacco use 09/20/2017  . Symptomatic anemia 09/20/2017  . Headache 09/20/2017  . Serotonin syndrome 09/28/2013  . Acute respiratory failure with hypoxia ([place]) 09/28/2013  . Encephalopathy acute 09/28/2013  . Hypotension, unspecified 09/28/2013  . 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 Type 2 diabetes mellitus ([place]) 03/06/2008  . INSOMNIA, CHRONIC 03/06/2008  . Essential hypertension 03/06/2008  . ASTHMA 03/06/2008  . ECZEMA 03/06/2008  . BACK PAIN, LUMBAR, CHRONIC 03/06/2008    Past Surgical History:  Procedure Laterality Date  . BACK SURGERY  2016   lower back fusion with cage  . BIOPSY  09/23/2017   Procedure: BIOPSY;  Surgeon: Wonda Horner, MD;  Location: [facility];  Service: Endoscopy;;  . BIOPSY  05/19/2019   Procedure: BIOPSY;  Surgeon: Otis Brace, MD;  Location: [facility];  Service: Gastroenterology;;  . CESAREAN SECTION  1990   x 1   . COLONOSCOPY WITH PROPOFOL N/A 09/23/2017   Procedure: COLONOSCOPY WITH PROPOFOL Hemostatic clips placed;  Surgeon: Wonda Horner, MD;  Location: [facility];  Service: Endoscopy;  Laterality: N/A;  . DILATION AND CURETTAGE OF UTERUS  1988  . ESOPHAGOGASTRODUODENOSCOPY (EGD) WITH PROPOFOL N/A 09/23/2017   Procedure: ESOPHAGOGASTRODUODENOSCOPY (EGD) WITH PROPOFOL;  Surgeon: Wonda Horner, MD;  Location: [facility];  Service: Endoscopy;   Laterality: N/A;  . ESOPHAGOGASTRODUODENOSCOPY (EGD) WITH PROPOFOL N/A 05/19/2019   Procedure: ESOPHAGOGASTRODUODENOSCOPY (EGD) WITH PROPOFOL;  Surgeon: Otis Brace, MD;  Location: [facility];  Service: Gastroenterology;  Laterality: N/A;  . GIVENS CAPSULE STUDY N/A 05/19/2019   Procedure: GIVENS CAPSULE STUDY;  Surgeon: Otis Brace, MD;  Location: [facility];  Service: Gastroenterology;  Laterality: N/A;  . HOT HEMOSTASIS N/A 05/19/2019   Procedure: HOT HEMOSTASIS (ARGON PLASMA COAGULATION/BICAP);  Surgeon: Otis Brace, MD;  Location: [facility] ENDOSCOPY;  Service: Gastroenterology;  Laterality: N/A;  . PARTIAL KNEE ARTHROPLASTY Left 11/12/2017   Procedure: left unicompartmental arthroplasty-medial;  Surgeon: Paralee Cancel, MD;  Location: [facility];  Service: Orthopedics;  Laterality: Left;  50min  . SUBMUCOSAL INJECTION  09/23/2017   Procedure: SUBMUCOSAL INJECTION;  Surgeon: Wonda Horner, MD;  Location: [facility];  Service: Endoscopy;;  in colon  . TUBAL LIGATION  1990     OB History   No obstetric history on file.     Family History  Problem Relation Age of Onset  . Lung cancer Mother   . Diabetes Sister   . Heart disease Sister   . Asthma Daughter   . Arthritis Daughter   . Arthritis Daughter   . Thyroid cancer Other        1/2 sister  . Heart disease Other        1/2 sister    Social History   Tobacco Use  . Smoking status: Current Every Day Smoker    Packs/day: 0.50    Years: 40.00    Pack years: 20.00    Types: Cigarettes  . Smokeless tobacco: Never Used  Vaping Use  . Vaping Use: Never used  Substance Use Topics  . Alcohol use: Yes    Comment: occ  . Drug use: No    Home Medications Prior to Admission medications   Medication Sig Start Date End Date Taking? Authorizing Provider  acetaminophen (TYLENOL) 500 MG tablet Take 1,000 mg by mouth every 6 (six) hours as needed for mild pain.    [provider]  allopurinol (ZYLOPRIM) 100 MG  tablet Take 100 mg by mouth every evening.    [provider]  amLODipine (NORVASC) 10 MG tablet Take 10 mg by mouth daily.    [provider]  budesonide-formoterol (SYMBICORT) 160-4.5 MCG/ACT inhaler Inhale 2 puffs into the lungs 2 (two) times daily.    [provider]  calcium acetate (PHOSLO) 667 MG capsule Take 3 capsules by mouth in the morning and at bedtime. 10/25/18   [provider]  carboxymethylcellulose 1 % ophthalmic solution Place 1 drop into both eyes 3 (three) times daily.    [provider]  cetirizine (ZYRTEC) 10 MG tablet Take 10 mg by mouth 2 (two) times daily as needed (hives).  06/13/17   Shelda Pal, DO  cinacalcet (SENSIPAR) 30 MG tablet Take 30 mg by mouth daily.    [provider]  cloNIDine (  CATAPRES) 0.3 MG tablet Take 1 tablet (0.3 mg total) by mouth 3 (three) times daily. 05/21/19   Hosie Poisson, MD  ELDERBERRY PO Take 2 drops by mouth daily.    [provider]  EPINEPHrine (EPIPEN) 0.3 mg/0.3 mL SOAJ injection Inject 0.3 mLs (0.3 mg total) into the muscle once. Patient taking differently: Inject 0.3 mg into the muscle as needed (for allergic reaction).  09/30/12   Le, Thao P, DO  esomeprazole (NEXIUM) 20 MG capsule Take 2 capsules (40 mg total) by mouth daily. 05/21/19   Hosie Poisson, MD  ferrous sulfate 325 (65 FE) MG tablet Take 325 mg by mouth 3 (three) times a week. TUE THUR SAT    [provider]  fluticasone (FLONASE) 50 MCG/ACT nasal spray Place 1 spray into both nostrils daily.     [provider]  folic acid (FOLVITE) 093 MCG tablet Take 400 mcg by mouth daily.     [provider]  furosemide (LASIX) 40 MG tablet Take 40 mg by mouth 2 (two) times daily.     [provider]  gabapentin (NEURONTIN) 300 MG capsule Take 1 capsule (300 mg total) by mouth at bedtime as needed (Neuropathic pain). Patient taking differently: Take 300 mg by mouth daily with  supper.  12/14/17 05/18/19  Dana Allan I, MD  hydrALAZINE (APRESOLINE) 50 MG tablet Take 1 tablet (50 mg total) by mouth every 8 (eight) hours. Patient taking differently: Take 100 mg by mouth every 8 (eight) hours.  12/14/17   Bonnell Public, MD  isosorbide mononitrate (IMDUR) 60 MG 24 hr tablet Take 1 tablet (60 mg total) by mouth daily. 03/02/18   Thurnell Lose, MD  JANUVIA 25 MG tablet Take 25 mg by mouth daily.  08/09/15   [provider]  labetalol (NORMODYNE) 200 MG tablet Take 400 mg by mouth 3 (three) times daily.    [provider]  LORazepam (ATIVAN) 0.5 MG tablet Take 0.5 mg by mouth every 8 (eight) hours as needed for anxiety. 05/02/19   [provider]  methocarbamol (ROBAXIN) 500 MG tablet Take 500 mg by mouth 4 (four) times daily as needed for muscle pain. neck 05/01/19   [provider]  montelukast (SINGULAIR) 10 MG tablet Take 10 mg by mouth daily with supper.     [provider]  multivitamin (RENA-VIT) TABS tablet Take 1 tablet by mouth daily with supper.    [provider]  olmesartan (BENICAR) 40 MG tablet Take 40 mg by mouth daily. 05/06/19   [provider]  Omega-3 1000 MG CAPS Take 1,000 mg by mouth in the morning, at noon, and at bedtime.    [provider]  ondansetron (ZOFRAN) 4 MG tablet Take 1 tablet (4 mg total) by mouth every 8 (eight) hours as needed for nausea or vomiting. 05/21/19 05/20/20  Hosie Poisson, MD  Oxycodone HCl 10 MG TABS Take 1 tablet (10 mg total) by mouth every 6 (six) hours as needed (pain score 7-10). 09/15/18   Black, Lezlie Octave, NP  predniSONE (DELTASONE) 5 MG tablet Take 5 mg by mouth daily with breakfast.     [provider]  Probiotic Product (PROBIOTIC PO) Take 1 capsule by mouth daily.    [provider]  senna (SENOKOT) 8.6 MG tablet Take 1-2 tablets by mouth daily as needed for constipation.    [provider]  Tiotropium Bromide  Monohydrate (SPIRIVA RESPIMAT IN) Inhale 2 puffs into the lungs daily.  [provider]  VENTOLIN HFA 108 (90 Base) MCG/ACT inhaler Inhale 1-2 puffs into the lungs every 6 (six) hours as needed for shortness of breath. wheezing 06/13/15   [provider]  XELJANZ 5 MG TABS Take 1 tablet by mouth at bedtime. 05/17/19   [provider]  zolpidem (AMBIEN) 5 MG tablet Take 5 mg by mouth at bedtime. 05/05/19   [provider]    Allergies    Banana, Black walnut flavor, Hazelnut (filbert) allergy skin test, No healthtouch food allergies, Pecan nut (diagnostic), Adalimumab, Ezetimibe, Ibuprofen, Leflunomide and related, Lexapro [escitalopram oxalate], Lisinopril, Secukinumab, Statins, and Trazodone and nefazodone  Review of Systems   Review of Systems  Cardiovascular: Positive for near-syncope.  Neurological: Positive for dizziness.  All other systems reviewed and are negative.   Physical Exam Updated Vital Signs BP 114/64 (BP Location: Right Arm)   Pulse (!) 103   Temp 98.8 F (37.1 C) (Oral)   Resp 20   Ht 5\' 3"  (1.6 m)   Wt 74 kg   SpO2 100%   BMI 28.90 kg/m   Physical Exam Vitals and nursing note reviewed.  Constitutional:      Appearance: Normal appearance.     Comments: Chronically ill  HENT:     Head: Normocephalic.     Nose: Nose normal.     Mouth/Throat:     Mouth: Mucous membranes are moist.  Eyes:     Extraocular Movements: Extraocular movements intact.     Pupils: Pupils are equal, round, and reactive to light.  Cardiovascular:     Rate and Rhythm: Normal rate and regular rhythm.     Pulses: Normal pulses.     Heart sounds: Normal heart sounds.  Pulmonary:     Effort: Pulmonary effort is normal.     Breath sounds: Normal breath sounds.  Abdominal:     General: Abdomen is flat.     Palpations: Abdomen is soft.  Musculoskeletal:        General: Normal range of motion.     Cervical back: Normal range of motion and neck supple.    Skin:    General: Skin is warm.     Capillary Refill: Capillary refill takes less than 2 seconds.  Neurological:     Comments: No obvious eye deviation.  Patient has normal strength and sensation bilateral arms and legs.  No obvious facial droop.  Psychiatric:        Mood and Affect: Mood normal.        Behavior: Behavior normal.     ED Results / Procedures / Treatments   Labs (all labs ordered are listed, but only abnormal results are displayed) Labs Reviewed  BASIC METABOLIC PANEL - Abnormal; Notable for the following components:      Result Value   Sodium 132 (*)    Chloride 92 (*)    Glucose, Bld 137 (*)    BUN 25 (*)    Creatinine, Ser 7.31 (*)    GFR, Estimated 6 (*)    Anion gap 18 (*)    All other components within normal limits  CBC - Abnormal; Notable for the following components:   RBC 3.79 (*)    MCV 100.5 (*)    All other components within normal limits  CBG MONITORING, ED - Abnormal; Notable for the following components:   Glucose-Capillary 152 (*)    All other components within normal limits  URINALYSIS, ROUTINE W REFLEX MICROSCOPIC  TROPONIN I (HIGH SENSITIVITY)  EKG EKG Interpretation  Date/Time:  Thursday December 15 2019 18:39:11 EDT Ventricular Rate:  97 PR Interval:    QRS Duration: 97 QT Interval:  376 QTC Calculation: 478 R Axis:   -14 Text Interpretation: Sinus rhythm No significant change since last tracing Confirmed by Wandra Arthurs [phone]) on 12/15/2019 6:40:47 PM   Radiology No results found.  Procedures Procedures (including critical care time)  Medications Ordered in ED Medications  sodium chloride 0.9 % bolus 500 mL (500 mLs Intravenous New Bag/Given 12/15/19 1859)    ED Course  I have reviewed the triage vital signs and the nursing notes.  Pertinent labs & imaging results that were available during my care of the patient were reviewed by me and considered in my medical decision making (see chart for details).    MDM  Rules/Calculators/A&P                         Jodi Bell is a 59 y.o. female here presenting with near syncope.  Patient's blood pressure in the room was in the low 90s.  I wonder if she was diuresed too much yesterday.  Also consider orthostatic hypotension.  Will get CBC and BMP and also chest x-ray.  Will give 500 cc bolus.  9:56 PM Labs at baseline.  Troponin negative x2 and chest x-ray did not show any pulmonary edema.  I think likely too much fluid was taken off during dialysis.  I told her to hold her blood pressure medicines in the morning.  She also should tell dialysis take off less fluid tomorrow.  Her blood pressure came up to 103/56 from 90s after some fluids.  Stable for discharge.   Final Clinical Impression(s) / ED Diagnoses Final diagnoses:  None    Rx / DC Orders ED Discharge Orders    None       Drenda Freeze, MD 12/15/19 2157

## 2020-02-09 ENCOUNTER — Ambulatory Visit: Payer: Self-pay | Admitting: Student

## 2020-02-13 NOTE — Progress Notes (Deleted)
Primary Physician/Referring:  [name]., MD  Patient ID: Jodi Bell, female    DOB: Jun 13, 1960, 60 y.o.   MRN: [id number]  No chief complaint on file.  HPI:    Jodi Bell  is a 60 y.o. female with history of hypertension, type 2 diabetes, focal segmental glomerulosclerosis, stage V chronic kidney disease awaiting transplant, COPD, HFpEF, aortic sclerosis.  Patient was last seen in the office by Dr. Virgina Jock 12/24/2017 for management of hypertension.  Patient continues to follow with nephrologist Dr. Neta Ehlers***.  Elevated cholesterol in 2018 but patient reports significant myalgias with statin therapy and therefore in the past has preferred to avoid statin therapy.   ***Patient was seen in ED at [facility] on 12/15/2019 with complaints of near syncope following dialysis earlier that morning. Workup revealed orthostatics and blood pressure improved with fluid resuscitation. It was suspected patient was over diuresed at dialysis and she was discharged.   Past Medical History:  Diagnosis Date  . Anemia   . Arthritis    oa and psoriatic ra  . Asthma   . Back pain, chronic    lower back  . Candida infection finished tx 2 days ago  . Chronic insomnia   . Chronic kidney disease    stage 3 ckd last ov nephrology 04-29-17 epic  . Chronic kidney disease (CKD) stage G4/A1, severely decreased glomerular filtration rate (GFR) between 15-29 mL/min/1.73 square meter and albuminuria creatinine ratio less than 30 mg/g (HCC)   . COPD (chronic obstructive pulmonary disease) ([place])   . Diabetes mellitus    type 2  . Eczema   . Essential hypertension   . GERD (gastroesophageal reflux disease)   . History of hiatal hernia   . Obesity   . Pneumonia few yrs ago x 2  . Sleep apnea    Past Surgical History:  Procedure Laterality Date  . BACK SURGERY  2016   lower back fusion with cage  . BIOPSY  09/23/2017   Procedure: BIOPSY;  Surgeon: Wonda Horner, MD;  Location: [facility]  ENDOSCOPY;  Service: Endoscopy;;  . BIOPSY  05/19/2019   Procedure: BIOPSY;  Surgeon: Otis Brace, MD;  Location: [place];  Service: Gastroenterology;;  . CESAREAN SECTION  1990   x 1   . COLONOSCOPY WITH PROPOFOL N/A 09/23/2017   Procedure: COLONOSCOPY WITH PROPOFOL Hemostatic clips placed;  Surgeon: Wonda Horner, MD;  Location: [facility] ENDOSCOPY;  Service: Endoscopy;  Laterality: N/A;  . DILATION AND CURETTAGE OF UTERUS  1988  . ESOPHAGOGASTRODUODENOSCOPY (EGD) WITH PROPOFOL N/A 09/23/2017   Procedure: ESOPHAGOGASTRODUODENOSCOPY (EGD) WITH PROPOFOL;  Surgeon: Wonda Horner, MD;  Location: [facility] ENDOSCOPY;  Service: Endoscopy;  Laterality: N/A;  . ESOPHAGOGASTRODUODENOSCOPY (EGD) WITH PROPOFOL N/A 05/19/2019   Procedure: ESOPHAGOGASTRODUODENOSCOPY (EGD) WITH PROPOFOL;  Surgeon: Otis Brace, MD;  Location: [facility] ENDOSCOPY;  Service: Gastroenterology;  Laterality: N/A;  . GIVENS CAPSULE STUDY N/A 05/19/2019   Procedure: GIVENS CAPSULE STUDY;  Surgeon: Otis Brace, MD;  Location: [place];  Service: Gastroenterology;  Laterality: N/A;  . HOT HEMOSTASIS N/A 05/19/2019   Procedure: HOT HEMOSTASIS (ARGON PLASMA COAGULATION/BICAP);  Surgeon: Otis Brace, MD;  Location: [facility] ENDOSCOPY;  Service: Gastroenterology;  Laterality: N/A;  . PARTIAL KNEE ARTHROPLASTY Left 11/12/2017   Procedure: left unicompartmental arthroplasty-medial;  Surgeon: Paralee Cancel, MD;  Location: [facility] ORS;  Service: Orthopedics;  Laterality: Left;  62min  . SUBMUCOSAL INJECTION  09/23/2017   Procedure: SUBMUCOSAL INJECTION;  Surgeon: Wonda Horner, MD;  Location:  [facility] ENDOSCOPY;  Service: Endoscopy;;  in colon  . TUBAL LIGATION  1990   Family History  Problem Relation Age of Onset  . Lung cancer Mother   . Diabetes Sister   . Heart disease Sister   . Asthma Daughter   . Arthritis Daughter   . Arthritis Daughter   . Thyroid cancer Other        1/2 sister  . Heart disease Other        1/2 sister    Social  History   Tobacco Use  . Smoking status: Current Every Day Smoker    Packs/day: 0.50    Years: 40.00    Pack years: 20.00    Types: Cigarettes  . Smokeless tobacco: Never Used  Substance Use Topics  . Alcohol use: Yes    Comment: occ   Marital Status: Divorced   ROS  ***Review of Systems  Cardiovascular: Positive for dyspnea on exertion (stable).  Musculoskeletal: Positive for joint pain (bilateral knees) and muscle weakness (bilateral legs, chronic. Uses walker).    Objective  There were no vitals taken for this visit.  Vitals with BMI 12/15/2019 12/15/2019 12/15/2019  Height - - -  Weight - - -  BMI - - -  Systolic 762 831 99  Diastolic 71 61 67  Pulse 88 91 93      ***Physical Exam Vitals reviewed.  Constitutional:      Appearance: She is obese.     Comments: Pannus present  HENT:     Head: Normocephalic and atraumatic.  Cardiovascular:     Rate and Rhythm: Normal rate and regular rhythm.     Pulses: Intact distal pulses.          Carotid pulses are 2+ on the right side with bruit and 2+ on the left side with bruit.      Femoral pulses are 1+ on the right side and 1+ on the left side.      Popliteal pulses are 1+ on the right side and 1+ on the left side.       Dorsalis pedis pulses are 2+ on the right side and 2+ on the left side.       Posterior tibial pulses are 2+ on the right side and 2+ on the left side.     Heart sounds: S1 normal and S2 normal. Murmur heard.   Harsh midsystolic murmur is present with a grade of 2/6 at the upper right sternal border. No gallop.      Comments: Bilateral 1+ pitting edema lower extremities.  Femoral and popliteal pulses difficult to evaluate doe to patient's body habitus.  Pulmonary:     Effort: Pulmonary effort is normal. No respiratory distress.     Breath sounds: No wheezing, rhonchi or rales.  Neurological:     Mental Status: She is alert.     Laboratory examination:   Recent Labs    07/09/19 2105 07/11/19 0318  11/09/19 1152 12/15/19 1845  NA 134* 129* 134* 132*  K 4.5 5.2* 3.6 4.3  CL 94* 94* 94* 92*  CO2 26 19* 23 22  GLUCOSE 89 175* 248* 137*  BUN 26* 49* 12 25*  CREATININE 8.32* 10.35* 3.52* 7.31*  CALCIUM 9.3 8.7* 8.9 10.2  GFRNONAA 5* 4* 13* 6*  GFRAA 6* 4* 16*  --    CrCl cannot be calculated (Patient's most recent lab result is older than the maximum 21 days allowed.).  CMP Latest Ref Rng & Units 12/15/2019  11/09/2019 07/11/2019  Glucose 70 - 99 mg/dL 137(H) 248(H) 175(H)  BUN 6 - 20 mg/dL 25(H) 12 49(H)  Creatinine 0.44 - 1.00 mg/dL 7.31(H) 3.52(H) 10.35(H)  Sodium 135 - 145 mmol/L 132(L) 134(L) 129(L)  Potassium 3.5 - 5.1 mmol/L 4.3 3.6 5.2(H)  Chloride 98 - 111 mmol/L 92(L) 94(L) 94(L)  CO2 22 - 32 mmol/L 22 23 19(L)  Calcium 8.9 - 10.3 mg/dL 10.2 8.9 8.7(L)  Total Protein 6.5 - 8.1 g/dL - - -  Total Bilirubin 0.3 - 1.2 mg/dL - - -  Alkaline Phos 38 - 126 U/L - - -  AST 15 - 41 U/L - - -  ALT 0 - 44 U/L - - -   CBC Latest Ref Rng & Units 12/15/2019 11/09/2019 07/11/2019  WBC 4.0 - 10.5 K/uL 6.3 7.1 3.9(L)  Hemoglobin 12.0 - 15.0 g/dL 12.2 11.1(L) 10.5(L)  Hematocrit 36.0 - 46.0 % 38.1 35.3(L) 32.9(L)  Platelets 150 - 400 K/uL 250 282 227    Lipid Panel No results for input(s): CHOL, TRIG, LDLCALC, VLDL, HDL, CHOLHDL, LDLDIRECT in the last 8760 hours.  HEMOGLOBIN A1C Lab Results  Component Value Date   HGBA1C 4.9 05/19/2019   MPG 94 05/19/2019   TSH No results for input(s): TSH in the last 8760 hours.  External labs:   ***  Medications and allergies   Allergies  Allergen Reactions  . Banana Anaphylaxis and Swelling    TONGUE AND MOUTH SWELLING  . Black Walnut Flavor Anaphylaxis    Walnuts  . Hazelnut ([place]) Allergy Skin Test Anaphylaxis    Hazelnuts  . No Healthtouch Food Allergies Anaphylaxis    Spicy Mustard 4.7.2021 Pt reports that she eats Regular Yellow Mustard  . Pecan Nut (Diagnostic) Anaphylaxis    Pecans  . Adalimumab Other (See Comments)     Blisters on abdomen =humira  . Ezetimibe Other (See Comments)    myalgias  . Ibuprofen Other (See Comments)    Renal Problems  . Leflunomide And Related Other (See Comments)    Severe Headache  . Lexapro [Escitalopram Oxalate] Other (See Comments)    Hand problems - Serotonin Syndrome   . Lisinopril Swelling    Angioedema   . Secukinumab     Cosentyx = angiodema  . Statins Other (See Comments)    Leg pains and weakness  . Trazodone And Nefazodone Other (See Comments)    Serotonin Syndrome      Outpatient Medications Prior to Visit  Medication Sig Dispense Refill  . acetaminophen (TYLENOL) 500 MG tablet Take 1,000 mg by mouth every 6 (six) hours as needed for mild pain.    Marland Kitchen allopurinol (ZYLOPRIM) 100 MG tablet Take 100 mg by mouth every evening.    Marland Kitchen amLODipine (NORVASC) 10 MG tablet Take 10 mg by mouth daily.    . budesonide-formoterol (SYMBICORT) 160-4.5 MCG/ACT inhaler Inhale 2 puffs into the lungs 2 (two) times daily.    . calcium acetate (PHOSLO) 667 MG capsule Take 3 capsules by mouth in the morning and at bedtime.    . carboxymethylcellulose 1 % ophthalmic solution Place 1 drop into both eyes 3 (three) times daily.    . cetirizine (ZYRTEC) 10 MG tablet Take 10 mg by mouth 2 (two) times daily as needed (hives).     . cinacalcet (SENSIPAR) 30 MG tablet Take 30 mg by mouth daily.    . cloNIDine (CATAPRES) 0.3 MG tablet Take 1 tablet (0.3 mg total) by mouth 3 (three) times daily.    Marland Kitchen  ELDERBERRY PO Take 2 drops by mouth daily.    Marland Kitchen EPINEPHrine (EPIPEN) 0.3 mg/0.3 mL SOAJ injection Inject 0.3 mLs (0.3 mg total) into the muscle once. (Patient taking differently: Inject 0.3 mg into the muscle as needed (for allergic reaction). ) 2 Device 1  . esomeprazole (NEXIUM) 20 MG capsule Take 2 capsules (40 mg total) by mouth daily. 60 capsule 1  . ferrous sulfate 325 (65 FE) MG tablet Take 325 mg by mouth 3 (three) times a week. TUE THUR SAT    . fluticasone (FLONASE) 50 MCG/ACT nasal  spray Place 1 spray into both nostrils daily.     . folic acid (FOLVITE) 009 MCG tablet Take 400 mcg by mouth daily.     . furosemide (LASIX) 40 MG tablet Take 40 mg by mouth 2 (two) times daily.     Marland Kitchen gabapentin (NEURONTIN) 300 MG capsule Take 1 capsule (300 mg total) by mouth at bedtime as needed (Neuropathic pain). (Patient taking differently: Take 300 mg by mouth daily with supper. ) 30 capsule 2  . hydrALAZINE (APRESOLINE) 50 MG tablet Take 1 tablet (50 mg total) by mouth every 8 (eight) hours. (Patient taking differently: Take 100 mg by mouth every 8 (eight) hours. ) 90 tablet 1  . isosorbide mononitrate (IMDUR) 60 MG 24 hr tablet Take 1 tablet (60 mg total) by mouth daily. 30 tablet 0  . JANUVIA 25 MG tablet Take 25 mg by mouth daily.     Marland Kitchen labetalol (NORMODYNE) 200 MG tablet Take 400 mg by mouth 3 (three) times daily.    Marland Kitchen LORazepam (ATIVAN) 0.5 MG tablet Take 0.5 mg by mouth every 8 (eight) hours as needed for anxiety.    . methocarbamol (ROBAXIN) 500 MG tablet Take 500 mg by mouth 4 (four) times daily as needed for muscle pain. neck    . montelukast (SINGULAIR) 10 MG tablet Take 10 mg by mouth daily with supper.     . multivitamin (RENA-VIT) TABS tablet Take 1 tablet by mouth daily with supper.    . olmesartan (BENICAR) 40 MG tablet Take 40 mg by mouth daily.    . Omega-3 1000 MG CAPS Take 1,000 mg by mouth in the morning, at noon, and at bedtime.    . ondansetron (ZOFRAN) 4 MG tablet Take 1 tablet (4 mg total) by mouth every 8 (eight) hours as needed for nausea or vomiting. 20 tablet 0  . Oxycodone HCl 10 MG TABS Take 1 tablet (10 mg total) by mouth every 6 (six) hours as needed (pain score 7-10).    . predniSONE (DELTASONE) 5 MG tablet Take 5 mg by mouth daily with breakfast.     . Probiotic Product (PROBIOTIC PO) Take 1 capsule by mouth daily.    Marland Kitchen senna (SENOKOT) 8.6 MG tablet Take 1-2 tablets by mouth daily as needed for constipation.    . Tiotropium Bromide Monohydrate (SPIRIVA  RESPIMAT IN) Inhale 2 puffs into the lungs daily.    . VENTOLIN HFA 108 (90 Base) MCG/ACT inhaler Inhale 1-2 puffs into the lungs every 6 (six) hours as needed for shortness of breath. wheezing    . XELJANZ 5 MG TABS Take 1 tablet by mouth at bedtime.    Marland Kitchen zolpidem (AMBIEN) 5 MG tablet Take 5 mg by mouth at bedtime.     No facility-administered medications prior to visit.     Radiology:   No results found.  Cardiac Studies:   ***  Echocardiogram 08/31/2016:  Normal LV systolic  function, no regional wall motion abnormality.  Hyperdynamic LV with greater than 70% EF.  Aortic sclerosis.  Renal artery duplex 12/05/2016:  No evidence of renal artery occlusive disease in either renal artery.  Possibly a simple cyst in the right kidney.  Normal renal size bilateral.  Nuclear stress test Lexiscan 11/15/2016:  Stress EKG changes.  No reversible ischemia.  EF 69%   EKG:   ***  ***  Assessment     ICD-10-CM   1. Chronic diastolic (congestive) heart failure (HCC)  I50.32   2. Essential hypertension  I10   3. Hyperlipidemia associated with type 2 diabetes mellitus ([place])  E11.69    E78.5   4. Stenosis of right carotid artery  I65.21   5. 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 CKD (chronic kidney disease) stage 5, GFR less than 15 ml/min (HCC)  N18.5      There are no discontinued medications.  No orders of the defined types were placed in this encounter.   Recommendations:   VON INSCOE is a 60 y.o.  female with history of hypertension, type 2 diabetes, focal segmental glomerulosclerosis, stage V chronic kidney disease awaiting transplant, COPD, HFpEF, aortic sclerosis.  ***Lipids?  At last visit medical management of risk factors for coronary disease are limited due to advanced CAD and not on dialysis, however patient is now on dialysis.  Patient was seen in  collaboration with Dr. Marland Kitchen He also reviewed patient's chart and examined the patient. Dr. Marland Kitchen is in agreement of the plan.   During this visit I reviewed and updated: Tobacco history  allergies medication reconciliation  medical history  surgical history  family history  social history.  This note was created using a voice recognition software as a result there may be grammatical errors inadvertently enclosed that do not reflect the nature of this encounter. Every attempt is made to correct such errors.   Alethia Berthold, PA-C 02/14/2020, 3:18 PM Office: [phone]

## 2020-02-14 ENCOUNTER — Ambulatory Visit: Payer: Self-pay | Admitting: Student

## 2020-05-22 ENCOUNTER — Emergency Department (HOSPITAL_BASED_OUTPATIENT_CLINIC_OR_DEPARTMENT_OTHER): Payer: Medicare PPO

## 2020-05-22 ENCOUNTER — Emergency Department (HOSPITAL_BASED_OUTPATIENT_CLINIC_OR_DEPARTMENT_OTHER)
Admission: EM | Admit: 2020-05-22 | Discharge: 2020-05-22 | Disposition: A | Discharge status: Home / Self Care | Payer: Medicare PPO | Attending: Emergency Medicine | Admitting: Emergency Medicine

## 2020-05-22 ENCOUNTER — Encounter (HOSPITAL_BASED_OUTPATIENT_CLINIC_OR_DEPARTMENT_OTHER): Payer: Self-pay

## 2020-05-22 ENCOUNTER — Other Ambulatory Visit: Payer: Self-pay

## 2020-05-22 DIAGNOSIS — R0602 Shortness of breath: Secondary | ICD-10-CM | POA: Insufficient documentation

## 2020-05-22 DIAGNOSIS — Z992 Dependence on renal dialysis: Secondary | ICD-10-CM | POA: Diagnosis not present

## 2020-05-22 DIAGNOSIS — N186 End stage renal disease: Secondary | ICD-10-CM | POA: Insufficient documentation

## 2020-05-22 DIAGNOSIS — I120 Hypertensive chronic kidney disease with stage 5 chronic kidney disease or end stage renal disease: Secondary | ICD-10-CM | POA: Diagnosis not present

## 2020-05-22 DIAGNOSIS — Z96652 Presence of left artificial knee joint: Secondary | ICD-10-CM | POA: Diagnosis not present

## 2020-05-22 DIAGNOSIS — J449 Chronic obstructive pulmonary disease, unspecified: Secondary | ICD-10-CM | POA: Diagnosis not present

## 2020-05-22 DIAGNOSIS — R600 Localized edema: Secondary | ICD-10-CM | POA: Diagnosis not present

## 2020-05-22 DIAGNOSIS — J45909 Unspecified asthma, uncomplicated: Secondary | ICD-10-CM | POA: Diagnosis not present

## 2020-05-22 DIAGNOSIS — R609 Edema, unspecified: Secondary | ICD-10-CM

## 2020-05-22 DIAGNOSIS — Z79899 Other long term (current) drug therapy: Secondary | ICD-10-CM | POA: Diagnosis not present

## 2020-05-22 DIAGNOSIS — F17210 Nicotine dependence, cigarettes, uncomplicated: Secondary | ICD-10-CM | POA: Diagnosis not present

## 2020-05-22 DIAGNOSIS — E1122 Type 2 diabetes mellitus with diabetic chronic kidney disease: Secondary | ICD-10-CM | POA: Diagnosis not present

## 2020-05-22 DIAGNOSIS — M7989 Other specified soft tissue disorders: Secondary | ICD-10-CM | POA: Diagnosis present

## 2020-05-22 LAB — CBC WITH DIFFERENTIAL/PLATELET
Abs Immature Granulocytes: 0.01 10*3/uL (ref 0.00–0.07)
Basophils Absolute: 0 10*3/uL (ref 0.0–0.1)
Basophils Relative: 0 %
Eosinophils Absolute: 0 10*3/uL (ref 0.0–0.5)
Eosinophils Relative: 0 %
HCT: 33.1 % — ABNORMAL LOW (ref 36.0–46.0)
Hemoglobin: 10.5 g/dL — ABNORMAL LOW (ref 12.0–15.0)
Immature Granulocytes: 0 %
Lymphocytes Relative: 6 %
Lymphs Abs: 0.4 10*3/uL — ABNORMAL LOW (ref 0.7–4.0)
MCH: 31.8 pg (ref 26.0–34.0)
MCHC: 31.7 g/dL (ref 30.0–36.0)
MCV: 100.3 fL — ABNORMAL HIGH (ref 80.0–100.0)
Monocytes Absolute: 0.2 10*3/uL (ref 0.1–1.0)
Monocytes Relative: 3 %
Neutro Abs: 5.6 10*3/uL (ref 1.7–7.7)
Neutrophils Relative %: 91 %
Platelets: 254 10*3/uL (ref 150–400)
RBC: 3.3 MIL/uL — ABNORMAL LOW (ref 3.87–5.11)
RDW: 15.4 % (ref 11.5–15.5)
WBC: 6.2 10*3/uL (ref 4.0–10.5)
nRBC: 0 % (ref 0.0–0.2)

## 2020-05-22 LAB — COMPREHENSIVE METABOLIC PANEL
ALT: 8 U/L (ref 0–44)
AST: 16 U/L (ref 15–41)
Albumin: 3 g/dL — ABNORMAL LOW (ref 3.5–5.0)
Alkaline Phosphatase: 113 U/L (ref 38–126)
Anion gap: 9 (ref 5–15)
BUN: 12 mg/dL (ref 6–20)
CO2: 27 mmol/L (ref 22–32)
Calcium: 8.3 mg/dL — ABNORMAL LOW (ref 8.9–10.3)
Chloride: 95 mmol/L — ABNORMAL LOW (ref 98–111)
Creatinine, Ser: 5.97 mg/dL — ABNORMAL HIGH (ref 0.44–1.00)
GFR, Estimated: 8 mL/min — ABNORMAL LOW (ref >60)
Glucose, Bld: 176 mg/dL — ABNORMAL HIGH (ref 70–99)
Potassium: 4.1 mmol/L (ref 3.5–5.1)
Sodium: 131 mmol/L — ABNORMAL LOW (ref 135–145)
Total Bilirubin: 0.4 mg/dL (ref 0.3–1.2)
Total Protein: 6.6 g/dL (ref 6.5–8.1)

## 2020-05-22 MED ORDER — FUROSEMIDE 10 MG/ML IJ SOLN
40.0000 mg | Freq: Once | INTRAMUSCULAR | Status: AC
  Administered 2020-05-22: 40 mg via INTRAVENOUS
  Filled 2020-05-22: qty 4

## 2020-05-22 NOTE — ED Notes (Signed)
Patient transported to X-ray 

## 2020-05-22 NOTE — ED Provider Notes (Signed)
[facility] EMERGENCY DEPARTMENT Provider Note   CSN: [id number] Arrival date & time: 05/22/20  1558     History Chief Complaint  Patient presents with  . Leg Swelling  . Shortness of Breath    Jodi Bell is a 60 y.o. female.  The history is provided by the patient.  Shortness of Breath Severity:  Mild Onset quality:  Gradual Timing:  Intermittent Progression:  Unchanged Chronicity:  Recurrent Context comment:  Above her dry weight in diaylsis, increased leg swelling but improved since diaylsis yesterday. Takes lasix.  Relieved by:  Nothing Worsened by:  Nothing Associated symptoms: no abdominal pain, no chest pain, no cough, no ear pain, no fever, no rash, no sore throat and no vomiting   Risk factors: no hx of PE/DVT        Past Medical History:  Diagnosis Date  . Anemia   . Arthritis    oa and psoriatic ra  . Asthma   . Back pain, chronic    lower back  . Candida infection finished tx 2 days ago  . Chronic insomnia   . Chronic kidney disease    stage 3 ckd last ov nephrology 04-29-17 epic  . Chronic kidney disease (CKD) stage G4/A1, severely decreased glomerular filtration rate (GFR) between 15-29 mL/min/1.73 square meter and albuminuria creatinine ratio less than 30 mg/g (HCC)   . COPD (chronic obstructive pulmonary disease) ([place])   . Diabetes mellitus    type 2  . Eczema   . Essential hypertension   . GERD (gastroesophageal reflux disease)   . History of hiatal hernia   . Obesity   . Pneumonia few yrs ago x 2  . Sleep apnea     Patient Active Problem List   Diagnosis Date Noted  . GI bleeding 05/17/2019  . Volume overload state of heart 09/14/2018  . Intervertebral cervical disc disorder with myelopathy, cervical region 09/14/2018  . OSA (obstructive sleep apnea) 09/14/2018  . Asthma 05/16/2018  . Suspected COVID-19 virus infection 05/16/2018  . ESRD (end stage renal disease) ([place]) 05/16/2018  . Fluid overload 02/27/2018  . Chest  pain, rule out acute myocardial infarction 12/12/2017  . Status post left partial knee replacement 11/12/2017  . Hematochezia 09/20/2017  . GERD (gastroesophageal reflux disease) 09/20/2017  . 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 2 diabetes mellitus ([place]) 09/20/2017  . COPD (chronic obstructive pulmonary disease) ([place]) 09/20/2017  . Tobacco use 09/20/2017  . Symptomatic anemia 09/20/2017  . Headache 09/20/2017  . Serotonin syndrome 09/28/2013  . Acute respiratory failure with hypoxia ([place]) 09/28/2013  . Encephalopathy acute 09/28/2013  . Hypotension, unspecified 09/28/2013  . 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 Type 2 diabetes mellitus ([place]) 03/06/2008  . INSOMNIA, CHRONIC 03/06/2008  . Essential hypertension 03/06/2008  . ASTHMA 03/06/2008  . ECZEMA 03/06/2008  . BACK PAIN, LUMBAR, CHRONIC 03/06/2008    Past Surgical History:  Procedure Laterality Date  . BACK SURGERY  2016   lower back fusion with cage  . BIOPSY  09/23/2017   Procedure: BIOPSY;  Surgeon: Wonda Horner, MD;  Location: [facility];  Service: Endoscopy;;  . BIOPSY  05/19/2019   Procedure: BIOPSY;  Surgeon: Otis Brace, MD;  Location: [facility];  Service: Gastroenterology;;  . CESAREAN SECTION  1990   x 1   . COLONOSCOPY WITH PROPOFOL N/A 09/23/2017   Procedure: COLONOSCOPY WITH PROPOFOL Hemostatic clips placed;  Surgeon: Wonda Horner, MD;  Location: [facility];  Service: Endoscopy;  Laterality:  N/A;  . DILATION AND CURETTAGE OF UTERUS  1988  . ESOPHAGOGASTRODUODENOSCOPY (EGD) WITH PROPOFOL N/A 09/23/2017   Procedure: ESOPHAGOGASTRODUODENOSCOPY (EGD) WITH PROPOFOL;  Surgeon: Wonda Horner, MD;  Location: [facility];  Service: Endoscopy;  Laterality: N/A;  . ESOPHAGOGASTRODUODENOSCOPY (EGD) WITH PROPOFOL N/A 05/19/2019   Procedure: ESOPHAGOGASTRODUODENOSCOPY (EGD) WITH PROPOFOL;  Surgeon: Otis Brace, MD;  Location: [facility];  Service: Gastroenterology;  Laterality: N/A;  . GIVENS CAPSULE STUDY N/A  05/19/2019   Procedure: GIVENS CAPSULE STUDY;  Surgeon: Otis Brace, MD;  Location: [facility];  Service: Gastroenterology;  Laterality: N/A;  . HOT HEMOSTASIS N/A 05/19/2019   Procedure: HOT HEMOSTASIS (ARGON PLASMA COAGULATION/BICAP);  Surgeon: Otis Brace, MD;  Location: [facility];  Service: Gastroenterology;  Laterality: N/A;  . PARTIAL KNEE ARTHROPLASTY Left 11/12/2017   Procedure: left unicompartmental arthroplasty-medial;  Surgeon: Paralee Cancel, MD;  Location: [facility];  Service: Orthopedics;  Laterality: Left;  19mn  . SUBMUCOSAL INJECTION  09/23/2017   Procedure: SUBMUCOSAL INJECTION;  Surgeon: GWonda Horner MD;  Location: [facility];  Service: Endoscopy;;  in colon  . TUBAL LIGATION  1990     OB History   No obstetric history on file.     Family History  Problem Relation Age of Onset  . Lung cancer Mother   . Diabetes Sister   . Heart disease Sister   . Asthma Daughter   . Arthritis Daughter   . Arthritis Daughter   . Thyroid cancer Other        1/2 sister  . Heart disease Other        1/2 sister    Social History   Tobacco Use  . Smoking status: Current Every Day Smoker    Packs/day: 0.50    Years: 40.00    Pack years: 20.00    Types: Cigarettes  . Smokeless tobacco: Never Used  Vaping Use  . Vaping Use: Never used  Substance Use Topics  . Alcohol use: Yes    Comment: occ  . Drug use: No    Home Medications Prior to Admission medications   Medication Sig Start Date End Date Taking? Authorizing Provider  acetaminophen (TYLENOL) 500 MG tablet Take 1,000 mg by mouth every 6 (six) hours as needed for mild pain.    [provider]  allopurinol (ZYLOPRIM) 100 MG tablet Take 100 mg by mouth every evening.    [provider]  amLODipine (NORVASC) 10 MG tablet Take 10 mg by mouth daily.    [provider]  budesonide-formoterol (SYMBICORT) 160-4.5 MCG/ACT inhaler Inhale 2 puffs into the lungs 2 (two) times daily.     [provider]  calcium acetate (PHOSLO) 667 MG capsule Take 3 capsules by mouth in the morning and at bedtime. 10/25/18   [provider]  carboxymethylcellulose 1 % ophthalmic solution Place 1 drop into both eyes 3 (three) times daily.    [provider]  cetirizine (ZYRTEC) 10 MG tablet Take 10 mg by mouth 2 (two) times daily as needed (hives).  06/13/17   WShelda Pal DO  cinacalcet (SENSIPAR) 30 MG tablet Take 30 mg by mouth daily.    [provider]  cloNIDine (CATAPRES) 0.3 MG tablet Take 1 tablet (0.3 mg total) by mouth 3 (three) times daily. 05/21/19   AHosie Poisson MD  ELDERBERRY PO Take 2 drops by mouth daily.    [provider]  EPINEPHrine (EPIPEN) 0.3 mg/0.3 mL SOAJ injection Inject 0.3 mLs (0.3 mg total) into the  muscle once. Patient taking differently: Inject 0.3 mg into the muscle as needed (for allergic reaction).  09/30/12   Le, Thao P, DO  esomeprazole (NEXIUM) 20 MG capsule Take 2 capsules (40 mg total) by mouth daily. 05/21/19   Hosie Poisson, MD  ferrous sulfate 325 (65 FE) MG tablet Take 325 mg by mouth 3 (three) times a week. TUE THUR SAT    [provider]  fluticasone (FLONASE) 50 MCG/ACT nasal spray Place 1 spray into both nostrils daily.     [provider]  folic acid (FOLVITE) Q000111Q MCG tablet Take 400 mcg by mouth daily.     [provider]  furosemide (LASIX) 40 MG tablet Take 40 mg by mouth 2 (two) times daily.     [provider]  gabapentin (NEURONTIN) 300 MG capsule Take 1 capsule (300 mg total) by mouth at bedtime as needed (Neuropathic pain). Patient taking differently: Take 300 mg by mouth daily with supper.  12/14/17 05/18/19  Dana Allan I, MD  hydrALAZINE (APRESOLINE) 50 MG tablet Take 1 tablet (50 mg total) by mouth every 8 (eight) hours. Patient taking differently: Take 100 mg by mouth every 8 (eight) hours.  12/14/17   Bonnell Public, MD  isosorbide mononitrate  (IMDUR) 60 MG 24 hr tablet Take 1 tablet (60 mg total) by mouth daily. 03/02/18   Thurnell Lose, MD  JANUVIA 25 MG tablet Take 25 mg by mouth daily.  08/09/15   [provider]  labetalol (NORMODYNE) 200 MG tablet Take 400 mg by mouth 3 (three) times daily.    [provider]  LORazepam (ATIVAN) 0.5 MG tablet Take 0.5 mg by mouth every 8 (eight) hours as needed for anxiety. 05/02/19   [provider]  methocarbamol (ROBAXIN) 500 MG tablet Take 500 mg by mouth 4 (four) times daily as needed for muscle pain. neck 05/01/19   [provider]  montelukast (SINGULAIR) 10 MG tablet Take 10 mg by mouth daily with supper.     [provider]  multivitamin (RENA-VIT) TABS tablet Take 1 tablet by mouth daily with supper.    [provider]  olmesartan (BENICAR) 40 MG tablet Take 40 mg by mouth daily. 05/06/19   [provider]  Omega-3 1000 MG CAPS Take 1,000 mg by mouth in the morning, at noon, and at bedtime.    [provider]  Oxycodone HCl 10 MG TABS Take 1 tablet (10 mg total) by mouth every 6 (six) hours as needed (pain score 7-10). 09/15/18   Black, Lezlie Octave, NP  predniSONE (DELTASONE) 5 MG tablet Take 5 mg by mouth daily with breakfast.     [provider]  Probiotic Product (PROBIOTIC PO) Take 1 capsule by mouth daily.    [provider]  senna (SENOKOT) 8.6 MG tablet Take 1-2 tablets by mouth daily as needed for constipation.    [provider]  Tiotropium Bromide Monohydrate (SPIRIVA RESPIMAT IN) Inhale 2 puffs into the lungs daily.    [provider]  VENTOLIN HFA 108 (90 Base) MCG/ACT inhaler Inhale 1-2 puffs into the lungs every 6 (six) hours as needed for shortness of breath. wheezing 06/13/15   [provider]  XELJANZ 5 MG TABS Take 1 tablet by mouth at bedtime. 05/17/19   [provider]  zolpidem (AMBIEN) 5 MG tablet Take 5 mg by mouth at bedtime. 05/05/19   [provider]    Allergies    Banana, Black walnut  flavor, Hazelnut (filbert) allergy skin test, No healthtouch food allergies, Pecan nut (diagnostic), Adalimumab, Ezetimibe, Ibuprofen, Leflunomide and related, Lexapro [escitalopram oxalate], Lisinopril, Secukinumab, Statins, and Trazodone and nefazodone  Review of Systems   Review of Systems  Constitutional: Negative for chills and fever.  HENT: Negative for ear pain and sore throat.   Eyes: Negative for pain and visual disturbance.  Respiratory: Positive for shortness of breath. Negative for cough.   Cardiovascular: Positive for leg swelling. Negative for chest pain and palpitations.  Gastrointestinal: Negative for abdominal pain and vomiting.  Genitourinary: Negative for dysuria and hematuria.  Musculoskeletal: Negative for arthralgias and back pain.  Skin: Negative for color change and rash.  Neurological: Negative for seizures and syncope.  All other systems reviewed and are negative.   Physical Exam Updated Vital Signs BP 138/83 (BP Location: Right Arm)   Pulse 79   Temp 98.4 F (36.9 C) (Oral)   Resp 20   Ht '5\' 2"'$  (1.575 m)   Wt 75.8 kg   SpO2 96%   BMI 30.54 kg/m   Physical Exam Vitals and nursing note reviewed.  Constitutional:      General: She is not in acute distress.    Appearance: She is well-developed.  HENT:     Head: Normocephalic and atraumatic.     Mouth/Throat:     Mouth: Mucous membranes are moist.  Eyes:     Conjunctiva/sclera: Conjunctivae normal.     Pupils: Pupils are equal, round, and reactive to light.  Cardiovascular:     Rate and Rhythm: Normal rate and regular rhythm.     Pulses: Normal pulses.     Heart sounds: Normal heart sounds. No murmur heard.   Pulmonary:     Effort: Pulmonary effort is normal. No respiratory distress.     Breath sounds: Normal breath sounds. No decreased breath sounds, wheezing or rhonchi.  Abdominal:     Palpations: Abdomen is soft.     Tenderness: There  is no abdominal tenderness.  Musculoskeletal:        General: Normal range of motion.     Cervical back: Normal range of motion and neck supple.     Right lower leg: No tenderness. Edema (1+) present.     Left lower leg: No tenderness. Edema (1+) present.  Skin:    General: Skin is warm and dry.     Capillary Refill: Capillary refill takes less than 2 seconds.  Neurological:     General: No focal deficit present.     Mental Status: She is alert.     ED Results / Procedures / Treatments   Labs (all labs ordered are listed, but only abnormal results are displayed) Labs Reviewed  CBC WITH DIFFERENTIAL/PLATELET - Abnormal; Notable for the following components:      Result Value   RBC 3.30 (*)    Hemoglobin 10.5 (*)    HCT 33.1 (*)    MCV 100.3 (*)    Lymphs Abs 0.4 (*)    All other components within normal limits  COMPREHENSIVE METABOLIC PANEL - Abnormal; Notable for the following components:   Sodium 131 (*)    Chloride 95 (*)    Glucose, Bld 176 (*)    Creatinine, Ser 5.97 (*)    Calcium 8.3 (*)    Albumin 3.0 (*)    GFR, Estimated 8 (*)    All other components within normal limits    EKG EKG Interpretation  Date/Time:  Tuesday May 22 2020 16:18:54 EDT  Ventricular Rate:  84 PR Interval:  172 QRS Duration: 86 QT Interval:  404 QTC Calculation: 477 R Axis:   -1 Text Interpretation: Normal sinus rhythm Possible Anterior infarct , age undetermined Abnormal ECG Confirmed by Lennice Sites [phone]) on 05/22/2020 5:01:38 PM   Radiology DG Chest 2 View  Result Date: 05/22/2020 CLINICAL DATA:  Short of breath, lower extremity edema EXAM: CHEST - 2 VIEW COMPARISON:  12/15/2019 FINDINGS: Frontal and lateral views of the chest demonstrate an enlarged cardiac silhouette. There is central vascular congestion without acute airspace disease. Are trace bilateral pleural effusions. No pneumothorax. No acute bony abnormalities. IMPRESSION: 1. Central vascular congestion without overt  edema. 2. Trace bilateral pleural effusions. Electronically Signed   By: Randa Ngo M.D.   On: 05/22/2020 17:28    Procedures Procedures   Medications Ordered in ED Medications  furosemide (LASIX) injection 40 mg (40 mg Intravenous Given 05/22/20 1813)    ED Course  I have reviewed the triage vital signs and the nursing notes.  Pertinent labs & imaging results that were available during my care of the patient were reviewed by me and considered in my medical decision making (see chart for details).    MDM Rules/Calculators/A&P                          Jodi Bell is a 60 year old female with history of CKD, COPD presents the ED with leg swelling, shortness of breath.  She states that she is slightly above her dry weight.  Had dialysis yesterday and feels better since then.  She has no signs of respiratory distress.  She has some trace swelling in her legs.  Chest x-ray shows some trace effusion but no overt edema.  Lab work showed no significant anemia, electrolyte abnormality, kidney injury.  She is on room air with normal work of breathing.  Overall we will give her an extra dose of Lasix here.  She has dialysis tomorrow.  Recommend having discussion with them about trying to get her back to her dry weight.  She understands return precautions.  No concern for PE or DVT as she is not hypoxic having no leg pain.  Symmetric swelling.  Overall suspect some mild volume overload that I think will improve with Lasix and further dialysis.  No emergent need for dialysis at this time.  Discharged in good condition.  EKG unremarkable.  Doubt cardiac process.  This chart was dictated using voice recognition software.  Despite best efforts to proofread,  errors can occur which can change the documentation meaning.    Final Clinical Impression(s) / ED Diagnoses Final diagnoses:  Peripheral edema    Rx / DC Orders ED Discharge Orders    None       Lennice Sites, DO 05/22/20 1816

## 2020-05-22 NOTE — ED Notes (Signed)
Patient states had daysis yesterday.  States has swelling of both legs and in  To lower abdomen  Legs swollen but non pitting.

## 2020-05-22 NOTE — ED Triage Notes (Signed)
Pt c/o bilat LE swelling, SOB x this am-states also bilat LE swelling 4/2-states she had dialysis yesterday and swelling was better-NAD-steady slow gait with own cane

## 2020-06-28 ENCOUNTER — Other Ambulatory Visit: Payer: Self-pay

## 2020-06-28 ENCOUNTER — Emergency Department (HOSPITAL_COMMUNITY): Payer: Medicare PPO

## 2020-06-28 ENCOUNTER — Encounter (HOSPITAL_COMMUNITY): Payer: Self-pay

## 2020-06-28 ENCOUNTER — Emergency Department (HOSPITAL_COMMUNITY)
Admission: EM | Admit: 2020-06-28 | Discharge: 2020-06-29 | Disposition: A | Discharge status: Home / Self Care | Payer: Medicare PPO | Attending: Emergency Medicine | Admitting: Emergency Medicine

## 2020-06-28 DIAGNOSIS — R079 Chest pain, unspecified: Secondary | ICD-10-CM | POA: Insufficient documentation

## 2020-06-28 DIAGNOSIS — Z96652 Presence of left artificial knee joint: Secondary | ICD-10-CM | POA: Insufficient documentation

## 2020-06-28 DIAGNOSIS — F17210 Nicotine dependence, cigarettes, uncomplicated: Secondary | ICD-10-CM | POA: Diagnosis not present

## 2020-06-28 DIAGNOSIS — Z7951 Long term (current) use of inhaled steroids: Secondary | ICD-10-CM | POA: Diagnosis not present

## 2020-06-28 DIAGNOSIS — J449 Chronic obstructive pulmonary disease, unspecified: Secondary | ICD-10-CM | POA: Diagnosis not present

## 2020-06-28 DIAGNOSIS — Z79899 Other long term (current) drug therapy: Secondary | ICD-10-CM | POA: Diagnosis not present

## 2020-06-28 DIAGNOSIS — I120 Hypertensive chronic kidney disease with stage 5 chronic kidney disease or end stage renal disease: Secondary | ICD-10-CM | POA: Diagnosis not present

## 2020-06-28 DIAGNOSIS — E1122 Type 2 diabetes mellitus with diabetic chronic kidney disease: Secondary | ICD-10-CM | POA: Insufficient documentation

## 2020-06-28 DIAGNOSIS — N186 End stage renal disease: Secondary | ICD-10-CM | POA: Insufficient documentation

## 2020-06-28 DIAGNOSIS — Z8616 Personal history of COVID-19: Secondary | ICD-10-CM | POA: Insufficient documentation

## 2020-06-28 DIAGNOSIS — R0602 Shortness of breath: Secondary | ICD-10-CM | POA: Insufficient documentation

## 2020-06-28 DIAGNOSIS — Z992 Dependence on renal dialysis: Secondary | ICD-10-CM | POA: Insufficient documentation

## 2020-06-28 DIAGNOSIS — J45909 Unspecified asthma, uncomplicated: Secondary | ICD-10-CM | POA: Diagnosis not present

## 2020-06-28 DIAGNOSIS — R0989 Other specified symptoms and signs involving the circulatory and respiratory systems: Secondary | ICD-10-CM | POA: Insufficient documentation

## 2020-06-28 DIAGNOSIS — R0789 Other chest pain: Secondary | ICD-10-CM

## 2020-06-28 LAB — BASIC METABOLIC PANEL
Anion gap: 9 (ref 5–15)
BUN: 19 mg/dL (ref 6–20)
CO2: 26 mmol/L (ref 22–32)
Calcium: 9.2 mg/dL (ref 8.9–10.3)
Chloride: 95 mmol/L — ABNORMAL LOW (ref 98–111)
Creatinine, Ser: 7.85 mg/dL — ABNORMAL HIGH (ref 0.44–1.00)
GFR, Estimated: 5 mL/min — ABNORMAL LOW (ref >60)
Glucose, Bld: 76 mg/dL (ref 70–99)
Potassium: 4.2 mmol/L (ref 3.5–5.1)
Sodium: 130 mmol/L — ABNORMAL LOW (ref 135–145)

## 2020-06-28 LAB — HEPATIC FUNCTION PANEL
ALT: 7 U/L (ref 0–44)
AST: 15 U/L (ref 15–41)
Albumin: 2.6 g/dL — ABNORMAL LOW (ref 3.5–5.0)
Alkaline Phosphatase: 89 U/L (ref 38–126)
Bilirubin, Direct: 0.1 mg/dL (ref 0.0–0.2)
Indirect Bilirubin: 0.6 mg/dL (ref 0.3–0.9)
Total Bilirubin: 0.7 mg/dL (ref 0.3–1.2)
Total Protein: 5.7 g/dL — ABNORMAL LOW (ref 6.5–8.1)

## 2020-06-28 LAB — CBC
HCT: 35.5 % — ABNORMAL LOW (ref 36.0–46.0)
Hemoglobin: 11.4 g/dL — ABNORMAL LOW (ref 12.0–15.0)
MCH: 31.8 pg (ref 26.0–34.0)
MCHC: 32.1 g/dL (ref 30.0–36.0)
MCV: 98.9 fL (ref 80.0–100.0)
Platelets: 169 10*3/uL (ref 150–400)
RBC: 3.59 MIL/uL — ABNORMAL LOW (ref 3.87–5.11)
RDW: 15.7 % — ABNORMAL HIGH (ref 11.5–15.5)
WBC: 4 10*3/uL (ref 4.0–10.5)
nRBC: 0 % (ref 0.0–0.2)

## 2020-06-28 LAB — D-DIMER, QUANTITATIVE: D-Dimer, Quant: 3.8 ug/mL-FEU — ABNORMAL HIGH (ref 0.00–0.50)

## 2020-06-28 LAB — TROPONIN I (HIGH SENSITIVITY): Troponin I (High Sensitivity): 11 ng/L (ref <18)

## 2020-06-28 LAB — LIPASE, BLOOD: Lipase: 25 U/L (ref 11–51)

## 2020-06-28 NOTE — ED Notes (Addendum)
Patient remained 94-95% on room air during ambulation

## 2020-06-28 NOTE — ED Provider Notes (Signed)
[facility] EMERGENCY DEPARTMENT Provider Note   CSN: [id number] Arrival date & time: 06/28/20  2028     History Chief Complaint  Patient presents with  . Chest Pain    Jodi Bell is a 60 y.o. female.  HPI 60 year old female presents with chest pain and shortness of breath.  Started this evening.  She noted a little bit of a low-grade temperature and cough yesterday morning but that resolved.  Chest pain feels like a burning sensation in the middle of her chest.  It is inferior and almost into her abdomen.  Some shortness of breath as well.  She missed dialysis yesterday because she was not feeling great.  For the last couple weeks since returning from a beach trip she has noticed that her right leg seems to be swollen.  Pain has improved with nitroglycerin given by EMS, went down from a 7 to a 4 down to a 3.  Past Medical History:  Diagnosis Date  . Anemia   . Arthritis    oa and psoriatic ra  . Asthma   . Back pain, chronic    lower back  . Candida infection finished tx 2 days ago  . Chronic insomnia   . Chronic kidney disease    stage 3 ckd last ov nephrology 04-29-17 epic  . Chronic kidney disease (CKD) stage G4/A1, severely decreased glomerular filtration rate (GFR) between 15-29 mL/min/1.73 square meter and albuminuria creatinine ratio less than 30 mg/g (HCC)   . COPD (chronic obstructive pulmonary disease) ([place])   . Diabetes mellitus    type 2  . Eczema   . Essential hypertension   . GERD (gastroesophageal reflux disease)   . History of hiatal hernia   . Obesity   . Pneumonia few yrs ago x 2  . Sleep apnea     Patient Active Problem List   Diagnosis Date Noted  . GI bleeding 05/17/2019  . Volume overload state of heart 09/14/2018  . Intervertebral cervical disc disorder with myelopathy, cervical region 09/14/2018  . OSA (obstructive sleep apnea) 09/14/2018  . Asthma 05/16/2018  . Suspected COVID-19 virus infection 05/16/2018  . ESRD  (end stage renal disease) ([place]) 05/16/2018  . Fluid overload 02/27/2018  . Chest pain, rule out acute myocardial infarction 12/12/2017  . Status post left partial knee replacement 11/12/2017  . Hematochezia 09/20/2017  . GERD (gastroesophageal reflux disease) 09/20/2017  . 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 2 diabetes mellitus ([place]) 09/20/2017  . COPD (chronic obstructive pulmonary disease) ([place]) 09/20/2017  . Tobacco use 09/20/2017  . Symptomatic anemia 09/20/2017  . Headache 09/20/2017  . Serotonin syndrome 09/28/2013  . Acute respiratory failure with hypoxia ([place]) 09/28/2013  . Encephalopathy acute 09/28/2013  . Hypotension, unspecified 09/28/2013  . 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 Type 2 diabetes mellitus ([place]) 03/06/2008  . INSOMNIA, CHRONIC 03/06/2008  . Essential hypertension 03/06/2008  . ASTHMA 03/06/2008  . ECZEMA 03/06/2008  . BACK PAIN, LUMBAR, CHRONIC 03/06/2008    Past Surgical History:  Procedure Laterality Date  . BACK SURGERY  2016   lower back fusion with cage  . BIOPSY  09/23/2017   Procedure: BIOPSY;  Surgeon: Wonda Horner, MD;  Location: [facility];  Service: Endoscopy;;  . BIOPSY  05/19/2019   Procedure: BIOPSY;  Surgeon: Otis Brace, MD;  Location: [place];  Service: Gastroenterology;;  . CESAREAN SECTION  1990   x 1   . COLONOSCOPY WITH PROPOFOL N/A 09/23/2017   Procedure:  COLONOSCOPY WITH PROPOFOL Hemostatic clips placed;  Surgeon: Wonda Horner, MD;  Location: [facility];  Service: Endoscopy;  Laterality: N/A;  . DILATION AND CURETTAGE OF UTERUS  1988  . ESOPHAGOGASTRODUODENOSCOPY (EGD) WITH PROPOFOL N/A 09/23/2017   Procedure: ESOPHAGOGASTRODUODENOSCOPY (EGD) WITH PROPOFOL;  Surgeon: Wonda Horner, MD;  Location: [facility];  Service: Endoscopy;  Laterality: N/A;  . ESOPHAGOGASTRODUODENOSCOPY (EGD) WITH PROPOFOL N/A 05/19/2019   Procedure: ESOPHAGOGASTRODUODENOSCOPY (EGD) WITH PROPOFOL;  Surgeon: Otis Brace, MD;  Location: [facility]  ENDOSCOPY;  Service: Gastroenterology;  Laterality: N/A;  . GIVENS CAPSULE STUDY N/A 05/19/2019   Procedure: GIVENS CAPSULE STUDY;  Surgeon: Otis Brace, MD;  Location: [place];  Service: Gastroenterology;  Laterality: N/A;  . HOT HEMOSTASIS N/A 05/19/2019   Procedure: HOT HEMOSTASIS (ARGON PLASMA COAGULATION/BICAP);  Surgeon: Otis Brace, MD;  Location: [facility] ENDOSCOPY;  Service: Gastroenterology;  Laterality: N/A;  . PARTIAL KNEE ARTHROPLASTY Left 11/12/2017   Procedure: left unicompartmental arthroplasty-medial;  Surgeon: Paralee Cancel, MD;  Location: [facility];  Service: Orthopedics;  Laterality: Left;  46mn  . SUBMUCOSAL INJECTION  09/23/2017   Procedure: SUBMUCOSAL INJECTION;  Surgeon: GWonda Horner MD;  Location: [facility];  Service: Endoscopy;;  in colon  . TUBAL LIGATION  1990     OB History   No obstetric history on file.     Family History  Problem Relation Age of Onset  . Lung cancer Mother   . Diabetes Sister   . Heart disease Sister   . Asthma Daughter   . Arthritis Daughter   . Arthritis Daughter   . Thyroid cancer Other        1/2 sister  . Heart disease Other        1/2 sister    Social History   Tobacco Use  . Smoking status: Current Every Day Smoker    Packs/day: 0.50    Years: 40.00    Pack years: 20.00    Types: Cigarettes  . Smokeless tobacco: Never Used  Vaping Use  . Vaping Use: Never used  Substance Use Topics  . Alcohol use: Yes    Comment: occ  . Drug use: No    Home Medications Prior to Admission medications   Medication Sig Start Date End Date Taking? Authorizing Provider  acetaminophen (TYLENOL) 500 MG tablet Take 1,000 mg by mouth every 6 (six) hours as needed for mild pain.    [provider]  allopurinol (ZYLOPRIM) 100 MG tablet Take 100 mg by mouth every evening.    [provider]  amLODipine (NORVASC) 10 MG tablet Take 10 mg by mouth daily.    [provider]  budesonide-formoterol  (SYMBICORT) 160-4.5 MCG/ACT inhaler Inhale 2 puffs into the lungs 2 (two) times daily.    [provider]  calcium acetate (PHOSLO) 667 MG capsule Take 3 capsules by mouth in the morning and at bedtime. 10/25/18   [provider]  carboxymethylcellulose 1 % ophthalmic solution Place 1 drop into both eyes 3 (three) times daily.    [provider]  cetirizine (ZYRTEC) 10 MG tablet Take 10 mg by mouth 2 (two) times daily as needed (hives).  06/13/17   WShelda Pal DO  cinacalcet (SENSIPAR) 30 MG tablet Take 30 mg by mouth daily.    [provider]  cloNIDine (CATAPRES) 0.3 MG tablet Take 1 tablet (0.3 mg total) by mouth 3 (three) times daily. 05/21/19   AHosie Poisson MD  ELDERBERRY PO Take 2 drops by mouth daily.  [provider]  EPINEPHrine (EPIPEN) 0.3 mg/0.3 mL SOAJ injection Inject 0.3 mLs (0.3 mg total) into the muscle once. Patient taking differently: Inject 0.3 mg into the muscle as needed (for allergic reaction).  09/30/12   Le, Thao P, DO  esomeprazole (NEXIUM) 20 MG capsule Take 2 capsules (40 mg total) by mouth daily. 05/21/19   Hosie Poisson, MD  ferrous sulfate 325 (65 FE) MG tablet Take 325 mg by mouth 3 (three) times a week. TUE THUR SAT    [provider]  fluticasone (FLONASE) 50 MCG/ACT nasal spray Place 1 spray into both nostrils daily.     [provider]  folic acid (FOLVITE) Q000111Q MCG tablet Take 400 mcg by mouth daily.     [provider]  furosemide (LASIX) 40 MG tablet Take 40 mg by mouth 2 (two) times daily.     [provider]  gabapentin (NEURONTIN) 300 MG capsule Take 1 capsule (300 mg total) by mouth at bedtime as needed (Neuropathic pain). Patient taking differently: Take 300 mg by mouth daily with supper.  12/14/17 05/18/19  Dana Allan I, MD  hydrALAZINE (APRESOLINE) 50 MG tablet Take 1 tablet (50 mg total) by mouth every 8 (eight) hours. Patient taking differently: Take 100 mg  by mouth every 8 (eight) hours.  12/14/17   Bonnell Public, MD  isosorbide mononitrate (IMDUR) 60 MG 24 hr tablet Take 1 tablet (60 mg total) by mouth daily. 03/02/18   Thurnell Lose, MD  JANUVIA 25 MG tablet Take 25 mg by mouth daily.  08/09/15   [provider]  labetalol (NORMODYNE) 200 MG tablet Take 400 mg by mouth 3 (three) times daily.    [provider]  LORazepam (ATIVAN) 0.5 MG tablet Take 0.5 mg by mouth every 8 (eight) hours as needed for anxiety. 05/02/19   [provider]  methocarbamol (ROBAXIN) 500 MG tablet Take 500 mg by mouth 4 (four) times daily as needed for muscle pain. neck 05/01/19   [provider]  montelukast (SINGULAIR) 10 MG tablet Take 10 mg by mouth daily with supper.     [provider]  multivitamin (RENA-VIT) TABS tablet Take 1 tablet by mouth daily with supper.    [provider]  olmesartan (BENICAR) 40 MG tablet Take 40 mg by mouth daily. 05/06/19   [provider]  Omega-3 1000 MG CAPS Take 1,000 mg by mouth in the morning, at noon, and at bedtime.    [provider]  Oxycodone HCl 10 MG TABS Take 1 tablet (10 mg total) by mouth every 6 (six) hours as needed (pain score 7-10). 09/15/18   Black, Lezlie Octave, NP  predniSONE (DELTASONE) 5 MG tablet Take 5 mg by mouth daily with breakfast.     [provider]  Probiotic Product (PROBIOTIC PO) Take 1 capsule by mouth daily.    [provider]  senna (SENOKOT) 8.6 MG tablet Take 1-2 tablets by mouth daily as needed for constipation.    [provider]  Tiotropium Bromide Monohydrate (SPIRIVA RESPIMAT IN) Inhale 2 puffs into the lungs daily.    [provider]  VENTOLIN HFA 108 (90 Base) MCG/ACT inhaler Inhale 1-2 puffs into the lungs every 6 (six) hours as needed for shortness of breath. wheezing 06/13/15   [provider]  XELJANZ 5 MG TABS Take 1 tablet by mouth at bedtime. 05/17/19   [provider]   zolpidem (AMBIEN) 5 MG tablet Take 5 mg by  mouth at bedtime. 05/05/19   [provider]    Allergies    Banana, Black walnut flavor, Hazelnut (filbert) allergy skin test, No healthtouch food allergies, Pecan nut (diagnostic), Adalimumab, Ezetimibe, Ibuprofen, Leflunomide and related, Lexapro [escitalopram oxalate], Lisinopril, Secukinumab, Statins, and Trazodone and nefazodone  Review of Systems   Review of Systems  Constitutional: Positive for fever (low grade, 99).  Respiratory: Positive for cough (yesterday, none today) and shortness of breath.   Cardiovascular: Positive for chest pain and leg swelling (Right).  Gastrointestinal: Negative for abdominal pain and vomiting.  All other systems reviewed and are negative.   Physical Exam Updated Vital Signs BP (!) 175/83   Pulse 83   Temp 98.4 F (36.9 C) (Temporal)   Resp 19   Ht '5\' 2"'$  (1.575 m)   Wt 75.8 kg   SpO2 94%   BMI 30.56 kg/m   Physical Exam Vitals and nursing note reviewed.  Constitutional:      General: She is not in acute distress.    Appearance: She is well-developed. She is not ill-appearing or diaphoretic.  HENT:     Head: Normocephalic and atraumatic.     Right Ear: External ear normal.     Left Ear: External ear normal.     Nose: Nose normal.  Eyes:     General:        Right eye: No discharge.        Left eye: No discharge.  Cardiovascular:     Rate and Rhythm: Normal rate and regular rhythm.     Heart sounds: Normal heart sounds.  Pulmonary:     Effort: Pulmonary effort is normal.     Breath sounds: Examination of the right-lower field reveals rales. Examination of the left-lower field reveals rales. Rales present.  Abdominal:     Palpations: Abdomen is soft.     Tenderness: There is no abdominal tenderness.  Musculoskeletal:     Right lower leg: No edema.     Left lower leg: No edema.  Skin:    General: Skin is warm and dry.  Neurological:     Mental Status: She is alert.   Psychiatric:        Mood and Affect: Mood is not anxious.     ED Results / Procedures / Treatments   Labs (all labs ordered are listed, but only abnormal results are displayed) Labs Reviewed  BASIC METABOLIC PANEL - Abnormal; Notable for the following components:      Result Value   Sodium 130 (*)    Chloride 95 (*)    Creatinine, Ser 7.85 (*)    GFR, Estimated 5 (*)    All other components within normal limits  CBC - Abnormal; Notable for the following components:   RBC 3.59 (*)    Hemoglobin 11.4 (*)    HCT 35.5 (*)    RDW 15.7 (*)    All other components within normal limits  D-DIMER, QUANTITATIVE - Abnormal; Notable for the following components:   D-Dimer, Quant 3.80 (*)    All other components within normal limits  HEPATIC FUNCTION PANEL - Abnormal; Notable for the following components:   Total Protein 5.7 (*)    Albumin 2.6 (*)    All other components within normal limits  LIPASE, BLOOD  TROPONIN I (HIGH SENSITIVITY)  TROPONIN I (HIGH SENSITIVITY)    EKG EKG Interpretation  Date/Time:  Thursday Jun 28 2020 20:41:59 EDT Ventricular Rate:  81 PR Interval:  179 QRS Duration:  89 QT Interval:  409 QTC Calculation: 475 R Axis:   -22 Text Interpretation: Sinus rhythm Consider left ventricular hypertrophy Confirmed by Sherwood Gambler [phone]) on 06/28/2020 8:51:32 PM   Radiology DG Chest 2 View  Result Date: 06/28/2020 CLINICAL DATA:  chest pain EXAM: CHEST - 2 VIEW COMPARISON:  May 22, 2020 FINDINGS: Enlarged cardiac silhouette with central vascular congestion. Bilateral interstitial thickening. No visible pneumothorax or pleural effusion. The visualized skeletal structures are unremarkable. IMPRESSION: Enlarged cardiac silhouette with central vascular congestion and bilateral interstitial thickening, suggestive of CHF. Electronically Signed   By: Dahlia Bailiff MD   On: 06/28/2020 21:11    Procedures Procedures   Medications Ordered in ED Medications - No  data to display  ED Course  I have reviewed the triage vital signs and the nursing notes.  Pertinent labs & imaging results that were available during my care of the patient were reviewed by me and considered in my medical decision making (see chart for details).    MDM Rules/Calculators/A&P                          Patient's chest pain is of unclear etiology.  Initial troponin is negative.  She did miss dialysis yesterday because she was not feeling great which is probably why she has some mild congestion seen on chest x-ray.  However she is able to ambulate here without significant shortness of breath and no hypoxia.  Given her recent travel to the beach with report of right leg swelling, PE work-up obtained he does have a positive D-dimer so will need V/Q scanning given the contrast shortage.  Second troponin is also pending.  Care transferred to Dr. Ronnald Nian with VQ and troponin pending.  If these are negative patient feels well enough for discharge and follow-up at dialysis tomorrow Final Clinical Impression(s) / ED Diagnoses Final diagnoses:  None    Rx / DC Orders ED Discharge Orders         Ordered    LE VENOUS        06/28/20 [id number]           Sherwood Gambler, MD 06/28/20 [id number]

## 2020-06-28 NOTE — ED Notes (Signed)
Patient transported to X-ray 

## 2020-06-28 NOTE — Discharge Instructions (Addendum)
IMPORTANT PATIENT INSTRUCTIONS:  You have been scheduled for an Outpatient Vascular Study at [facility].     If tomorrow is a Saturday, Sunday or holiday, please go to the [facility] Emergency Department Registration Desk at 11 am tomorrow morning and tell them you are there for a vascular study.    If tomorrow is a weekday (Monday-Friday), please go to [facility] at 11 am and tell them you are there for a vascular study.  If you develop recurrent, continued, or worsening chest pain, shortness of breath, fever, vomiting, abdominal or back pain, or any other new/concerning symptoms then return to the ER for evaluation.

## 2020-06-28 NOTE — ED Triage Notes (Signed)
Patient from home with [facility], reports chest pain and shortness of breath starting about 1 hour PTA, patient with hx of CHF, PNA, ESRD, gave herself a breathing treatment at home with relief.  190/100 80 HR 94% RA 175/86 after 2 SL Nitro ESRD L arm restriction CBG 98 324 mg ASA 18 R A/C

## 2020-06-28 NOTE — ED Notes (Signed)
Lab called to add on hepatic function, lipase, and d-dimer.

## 2020-06-29 ENCOUNTER — Emergency Department (HOSPITAL_COMMUNITY): Payer: Medicare PPO

## 2020-06-29 LAB — TROPONIN I (HIGH SENSITIVITY): Troponin I (High Sensitivity): 10 ng/L (ref <18)

## 2020-06-29 MED ORDER — FENTANYL CITRATE (PF) 100 MCG/2ML IJ SOLN
50.0000 ug | Freq: Once | INTRAMUSCULAR | Status: AC
  Administered 2020-06-29: 50 ug via INTRAVENOUS
  Filled 2020-06-29: qty 2

## 2020-06-29 MED ORDER — TECHNETIUM TO 99M ALBUMIN AGGREGATED
4.4000 | Freq: Once | INTRAVENOUS | Status: AC | PRN
  Administered 2020-06-29: 4.4 via INTRAVENOUS

## 2020-06-29 NOTE — ED Provider Notes (Signed)
Signed out to me awaiting repeat troponin and VQ scan.  Here with atypical chest pain.  Troponin stable at 10.  VQ scan negative for PE.  Discharged in good condition.   Lennice Sites, DO 06/29/20 [id number]

## 2020-06-29 NOTE — ED Notes (Signed)
Patient transported to Nuclear Medicine 

## 2020-07-03 ENCOUNTER — Inpatient Hospital Stay (HOSPITAL_COMMUNITY)
Admission: EM | Admit: 2020-07-03 | Discharge: 2020-07-06 | DRG: 643 | Disposition: A | Discharge status: Home / Self Care | Payer: Medicare PPO | Attending: Family Medicine | Admitting: Family Medicine

## 2020-07-03 ENCOUNTER — Emergency Department (HOSPITAL_COMMUNITY): Payer: Medicare PPO

## 2020-07-03 DIAGNOSIS — G4733 Obstructive sleep apnea (adult) (pediatric): Secondary | ICD-10-CM | POA: Diagnosis present

## 2020-07-03 DIAGNOSIS — N186 End stage renal disease: Secondary | ICD-10-CM

## 2020-07-03 DIAGNOSIS — J449 Chronic obstructive pulmonary disease, unspecified: Secondary | ICD-10-CM | POA: Diagnosis present

## 2020-07-03 DIAGNOSIS — E785 Hyperlipidemia, unspecified: Secondary | ICD-10-CM | POA: Diagnosis present

## 2020-07-03 DIAGNOSIS — E876 Hypokalemia: Secondary | ICD-10-CM | POA: Diagnosis not present

## 2020-07-03 DIAGNOSIS — Z87892 Personal history of anaphylaxis: Secondary | ICD-10-CM

## 2020-07-03 DIAGNOSIS — M542 Cervicalgia: Secondary | ICD-10-CM | POA: Diagnosis present

## 2020-07-03 DIAGNOSIS — E2740 Unspecified adrenocortical insufficiency: Principal | ICD-10-CM | POA: Diagnosis present

## 2020-07-03 DIAGNOSIS — Z8249 Family history of ischemic heart disease and other diseases of the circulatory system: Secondary | ICD-10-CM

## 2020-07-03 DIAGNOSIS — K219 Gastro-esophageal reflux disease without esophagitis: Secondary | ICD-10-CM | POA: Diagnosis present

## 2020-07-03 DIAGNOSIS — K5900 Constipation, unspecified: Secondary | ICD-10-CM | POA: Diagnosis not present

## 2020-07-03 DIAGNOSIS — I2510 Atherosclerotic heart disease of native coronary artery without angina pectoris: Secondary | ICD-10-CM | POA: Diagnosis present

## 2020-07-03 DIAGNOSIS — J4520 Mild intermittent asthma, uncomplicated: Secondary | ICD-10-CM

## 2020-07-03 DIAGNOSIS — J45909 Unspecified asthma, uncomplicated: Secondary | ICD-10-CM

## 2020-07-03 DIAGNOSIS — K5901 Slow transit constipation: Secondary | ICD-10-CM

## 2020-07-03 DIAGNOSIS — Z825 Family history of asthma and other chronic lower respiratory diseases: Secondary | ICD-10-CM

## 2020-07-03 DIAGNOSIS — E11649 Type 2 diabetes mellitus with hypoglycemia without coma: Secondary | ICD-10-CM | POA: Diagnosis present

## 2020-07-03 DIAGNOSIS — D631 Anemia in chronic kidney disease: Secondary | ICD-10-CM | POA: Diagnosis present

## 2020-07-03 DIAGNOSIS — E162 Hypoglycemia, unspecified: Secondary | ICD-10-CM

## 2020-07-03 DIAGNOSIS — F5104 Psychophysiologic insomnia: Secondary | ICD-10-CM | POA: Diagnosis present

## 2020-07-03 DIAGNOSIS — I150 Renovascular hypertension: Secondary | ICD-10-CM | POA: Diagnosis present

## 2020-07-03 DIAGNOSIS — E669 Obesity, unspecified: Secondary | ICD-10-CM | POA: Diagnosis present

## 2020-07-03 DIAGNOSIS — R0609 Other forms of dyspnea: Secondary | ICD-10-CM

## 2020-07-03 DIAGNOSIS — F17210 Nicotine dependence, cigarettes, uncomplicated: Secondary | ICD-10-CM | POA: Diagnosis present

## 2020-07-03 DIAGNOSIS — G4700 Insomnia, unspecified: Secondary | ICD-10-CM

## 2020-07-03 DIAGNOSIS — R0600 Dyspnea, unspecified: Secondary | ICD-10-CM | POA: Diagnosis present

## 2020-07-03 DIAGNOSIS — R0602 Shortness of breath: Secondary | ICD-10-CM

## 2020-07-03 DIAGNOSIS — Z8701 Personal history of pneumonia (recurrent): Secondary | ICD-10-CM

## 2020-07-03 DIAGNOSIS — Z885 Allergy status to narcotic agent status: Secondary | ICD-10-CM

## 2020-07-03 DIAGNOSIS — Z888 Allergy status to other drugs, medicaments and biological substances status: Secondary | ICD-10-CM

## 2020-07-03 DIAGNOSIS — M5000 Cervical disc disorder with myelopathy, unspecified cervical region: Secondary | ICD-10-CM

## 2020-07-03 DIAGNOSIS — J111 Influenza due to unidentified influenza virus with other respiratory manifestations: Secondary | ICD-10-CM | POA: Diagnosis present

## 2020-07-03 DIAGNOSIS — Z79899 Other long term (current) drug therapy: Secondary | ICD-10-CM

## 2020-07-03 DIAGNOSIS — Z992 Dependence on renal dialysis: Secondary | ICD-10-CM

## 2020-07-03 DIAGNOSIS — E1122 Type 2 diabetes mellitus with diabetic chronic kidney disease: Secondary | ICD-10-CM | POA: Diagnosis present

## 2020-07-03 DIAGNOSIS — I10 Essential (primary) hypertension: Secondary | ICD-10-CM

## 2020-07-03 DIAGNOSIS — F419 Anxiety disorder, unspecified: Secondary | ICD-10-CM | POA: Diagnosis present

## 2020-07-03 DIAGNOSIS — Z91018 Allergy to other foods: Secondary | ICD-10-CM

## 2020-07-03 DIAGNOSIS — Z981 Arthrodesis status: Secondary | ICD-10-CM

## 2020-07-03 DIAGNOSIS — Z7984 Long term (current) use of oral hypoglycemic drugs: Secondary | ICD-10-CM

## 2020-07-03 DIAGNOSIS — M069 Rheumatoid arthritis, unspecified: Secondary | ICD-10-CM | POA: Diagnosis present

## 2020-07-03 DIAGNOSIS — Z6828 Body mass index (BMI) 28.0-28.9, adult: Secondary | ICD-10-CM

## 2020-07-03 DIAGNOSIS — Z20822 Contact with and (suspected) exposure to covid-19: Secondary | ICD-10-CM | POA: Diagnosis present

## 2020-07-03 DIAGNOSIS — L4050 Arthropathic psoriasis, unspecified: Secondary | ICD-10-CM | POA: Diagnosis present

## 2020-07-03 DIAGNOSIS — M898X9 Other specified disorders of bone, unspecified site: Secondary | ICD-10-CM | POA: Diagnosis present

## 2020-07-03 DIAGNOSIS — Z833 Family history of diabetes mellitus: Secondary | ICD-10-CM

## 2020-07-03 HISTORY — DX: Peripheral vascular disease, unspecified: I73.9

## 2020-07-03 HISTORY — DX: Nonrheumatic aortic (valve) stenosis: I35.0

## 2020-07-03 HISTORY — DX: Tobacco use: Z72.0

## 2020-07-03 HISTORY — DX: Atherosclerotic heart disease of native coronary artery without angina pectoris: I25.10

## 2020-07-03 HISTORY — DX: End stage renal disease: N18.6

## 2020-07-03 HISTORY — DX: Rheumatic mitral stenosis: I05.0

## 2020-07-03 HISTORY — DX: Other disorders of plasma-protein metabolism, not elsewhere classified: E88.09

## 2020-07-03 HISTORY — DX: Gout, unspecified: M10.9

## 2020-07-03 HISTORY — DX: End stage renal disease: Z99.2

## 2020-07-03 HISTORY — DX: Anemia in other chronic diseases classified elsewhere: D63.8

## 2020-07-03 HISTORY — DX: Hypo-osmolality and hyponatremia: E87.1

## 2020-07-03 HISTORY — DX: Anxiety disorder, unspecified: F41.9

## 2020-07-03 LAB — COMPREHENSIVE METABOLIC PANEL
ALT: 9 U/L (ref 0–44)
AST: 17 U/L (ref 15–41)
Albumin: 2.5 g/dL — ABNORMAL LOW (ref 3.5–5.0)
Alkaline Phosphatase: 91 U/L (ref 38–126)
Anion gap: 14 (ref 5–15)
BUN: 22 mg/dL — ABNORMAL HIGH (ref 6–20)
CO2: 20 mmol/L — ABNORMAL LOW (ref 22–32)
Calcium: 8.4 mg/dL — ABNORMAL LOW (ref 8.9–10.3)
Chloride: 96 mmol/L — ABNORMAL LOW (ref 98–111)
Creatinine, Ser: 11.17 mg/dL — ABNORMAL HIGH (ref 0.44–1.00)
GFR, Estimated: 4 mL/min — ABNORMAL LOW (ref >60)
Glucose, Bld: 47 mg/dL — ABNORMAL LOW (ref 70–99)
Potassium: 4.9 mmol/L (ref 3.5–5.1)
Sodium: 130 mmol/L — ABNORMAL LOW (ref 135–145)
Total Bilirubin: 1.4 mg/dL — ABNORMAL HIGH (ref 0.3–1.2)
Total Protein: 5.8 g/dL — ABNORMAL LOW (ref 6.5–8.1)

## 2020-07-03 LAB — RESP PANEL BY RT-PCR (FLU A&B, COVID) ARPGX2
Influenza A by PCR: NEGATIVE
Influenza B by PCR: NEGATIVE
SARS Coronavirus 2 by RT PCR: NEGATIVE

## 2020-07-03 LAB — CBC WITH DIFFERENTIAL/PLATELET
Abs Immature Granulocytes: 0.01 10*3/uL (ref 0.00–0.07)
Basophils Absolute: 0 10*3/uL (ref 0.0–0.1)
Basophils Relative: 1 %
Eosinophils Absolute: 0.3 10*3/uL (ref 0.0–0.5)
Eosinophils Relative: 9 %
HCT: 35.6 % — ABNORMAL LOW (ref 36.0–46.0)
Hemoglobin: 11 g/dL — ABNORMAL LOW (ref 12.0–15.0)
Immature Granulocytes: 0 %
Lymphocytes Relative: 30 %
Lymphs Abs: 1.1 10*3/uL (ref 0.7–4.0)
MCH: 31.5 pg (ref 26.0–34.0)
MCHC: 30.9 g/dL (ref 30.0–36.0)
MCV: 102 fL — ABNORMAL HIGH (ref 80.0–100.0)
Monocytes Absolute: 0.5 10*3/uL (ref 0.1–1.0)
Monocytes Relative: 14 %
Neutro Abs: 1.6 10*3/uL — ABNORMAL LOW (ref 1.7–7.7)
Neutrophils Relative %: 46 %
Platelets: 200 10*3/uL (ref 150–400)
RBC: 3.49 MIL/uL — ABNORMAL LOW (ref 3.87–5.11)
RDW: 15.8 % — ABNORMAL HIGH (ref 11.5–15.5)
WBC: 3.6 10*3/uL — ABNORMAL LOW (ref 4.0–10.5)
nRBC: 0 % (ref 0.0–0.2)

## 2020-07-03 LAB — HEPATITIS B SURFACE ANTIGEN: Hepatitis B Surface Ag: NONREACTIVE

## 2020-07-03 LAB — HEPATITIS B SURFACE ANTIBODY,QUALITATIVE: Hep B S Ab: REACTIVE — AB

## 2020-07-03 LAB — HIV ANTIBODY (ROUTINE TESTING W REFLEX): HIV Screen 4th Generation wRfx: NONREACTIVE

## 2020-07-03 LAB — CBG MONITORING, ED
Glucose-Capillary: 42 mg/dL — CL (ref 70–99)
Glucose-Capillary: 49 mg/dL — ABNORMAL LOW (ref 70–99)
Glucose-Capillary: 156 mg/dL — ABNORMAL HIGH (ref 70–99)
Glucose-Capillary: 67 mg/dL — ABNORMAL LOW (ref 70–99)
Glucose-Capillary: 81 mg/dL (ref 70–99)
Glucose-Capillary: 70 mg/dL (ref 70–99)

## 2020-07-03 LAB — GLUCOSE, CAPILLARY: Glucose-Capillary: 87 mg/dL (ref 70–99)

## 2020-07-03 MED ORDER — ISOSORBIDE MONONITRATE ER 60 MG PO TB24
60.0000 mg | ORAL_TABLET | Freq: Every day | ORAL | Status: DC
  Administered 2020-07-04: 60 mg via ORAL
  Filled 2020-07-03 (×2): qty 1

## 2020-07-03 MED ORDER — ACETAMINOPHEN 500 MG PO TABS
ORAL_TABLET | ORAL | Status: AC
  Filled 2020-07-03: qty 2

## 2020-07-03 MED ORDER — ACETAMINOPHEN 500 MG PO TABS
1000.0000 mg | ORAL_TABLET | Freq: Four times a day (QID) | ORAL | Status: DC | PRN
  Administered 2020-07-03 – 2020-07-04 (×2): 1000 mg via ORAL
  Filled 2020-07-03: qty 2

## 2020-07-03 MED ORDER — ALBUTEROL SULFATE (2.5 MG/3ML) 0.083% IN NEBU: 3.0000 mL | INHALATION_SOLUTION | Freq: Four times a day (QID) | RESPIRATORY_TRACT | Status: DC | PRN

## 2020-07-03 MED ORDER — RAMELTEON 8 MG PO TABS
8.0000 mg | ORAL_TABLET | Freq: Every day | ORAL | Status: DC
  Filled 2020-07-03: qty 1

## 2020-07-03 MED ORDER — DEXTROSE 50 % IV SOLN
50.0000 mL | Freq: Once | INTRAVENOUS | Status: AC
  Administered 2020-07-03: 50 mL via INTRAVENOUS
  Filled 2020-07-03: qty 50

## 2020-07-03 MED ORDER — HEPARIN SODIUM (PORCINE) 5000 UNIT/ML IJ SOLN
5000.0000 [IU] | Freq: Three times a day (TID) | INTRAMUSCULAR | Status: DC
  Administered 2020-07-03 – 2020-07-06 (×8): 5000 [IU] via SUBCUTANEOUS
  Filled 2020-07-03 (×7): qty 1

## 2020-07-03 MED ORDER — GUAIFENESIN 200 MG PO TABS
200.0000 mg | ORAL_TABLET | ORAL | Status: DC | PRN
  Administered 2020-07-03: 200 mg via ORAL
  Filled 2020-07-03: qty 1

## 2020-07-03 MED ORDER — NICOTINE 7 MG/24HR TD PT24
7.0000 mg | MEDICATED_PATCH | Freq: Every day | TRANSDERMAL | Status: DC
  Administered 2020-07-03 – 2020-07-04 (×2): 7 mg via TRANSDERMAL
  Filled 2020-07-03 (×2): qty 1

## 2020-07-03 MED ORDER — FLUTICASONE FUROATE-VILANTEROL 200-25 MCG/INH IN AEPB
1.0000 | INHALATION_SPRAY | Freq: Every day | RESPIRATORY_TRACT | Status: DC
  Filled 2020-07-03: qty 28

## 2020-07-03 MED ORDER — CHLORHEXIDINE GLUCONATE CLOTH 2 % EX PADS: 6.0000 | MEDICATED_PAD | Freq: Every day | CUTANEOUS | Status: DC

## 2020-07-03 MED ORDER — HYDRALAZINE HCL 50 MG PO TABS: 50.0000 mg | ORAL_TABLET | Freq: Every evening | ORAL | Status: DC | PRN

## 2020-07-03 MED ORDER — FUROSEMIDE 40 MG PO TABS
40.0000 mg | ORAL_TABLET | Freq: Two times a day (BID) | ORAL | Status: DC
  Administered 2020-07-04 – 2020-07-06 (×5): 40 mg via ORAL
  Filled 2020-07-03: qty 2
  Filled 2020-07-03 (×4): qty 1

## 2020-07-03 MED ORDER — PARICALCITOL 5 MCG/ML IV SOLN
6.0000 ug | INTRAVENOUS | Status: DC
  Administered 2020-07-06: 6 ug via INTRAVENOUS
  Filled 2020-07-03 (×3): qty 1.2

## 2020-07-03 MED ORDER — AMLODIPINE BESYLATE 10 MG PO TABS
10.0000 mg | ORAL_TABLET | Freq: Every day | ORAL | Status: DC
  Administered 2020-07-04: 10 mg via ORAL
  Filled 2020-07-03: qty 2
  Filled 2020-07-03: qty 1

## 2020-07-03 MED ORDER — LABETALOL HCL 200 MG PO TABS
400.0000 mg | ORAL_TABLET | Freq: Three times a day (TID) | ORAL | Status: DC
  Administered 2020-07-03: 400 mg via ORAL
  Filled 2020-07-03: qty 2

## 2020-07-03 MED ORDER — TRIMETHOBENZAMIDE HCL 100 MG/ML IM SOLN
200.0000 mg | Freq: Once | INTRAMUSCULAR | Status: AC
  Administered 2020-07-03: 200 mg via INTRAMUSCULAR
  Filled 2020-07-03: qty 2

## 2020-07-03 MED ORDER — IRBESARTAN 300 MG PO TABS
300.0000 mg | ORAL_TABLET | Freq: Every day | ORAL | Status: DC
  Administered 2020-07-04: 300 mg via ORAL
  Filled 2020-07-03 (×2): qty 1

## 2020-07-03 MED ORDER — CLONIDINE HCL 0.3 MG PO TABS
0.3000 mg | ORAL_TABLET | Freq: Three times a day (TID) | ORAL | Status: DC
  Administered 2020-07-03 – 2020-07-06 (×8): 0.3 mg via ORAL
  Filled 2020-07-03 (×8): qty 1

## 2020-07-03 MED ORDER — HYDRALAZINE HCL 50 MG PO TABS
50.0000 mg | ORAL_TABLET | Freq: Three times a day (TID) | ORAL | Status: DC
  Administered 2020-07-04 (×2): 50 mg via ORAL
  Filled 2020-07-03 (×2): qty 1

## 2020-07-03 NOTE — Consult Note (Signed)
[facility] Renal Consultation Note  Requesting MD: Blanchie Dessert, MD Indication for Consultation:  ESRD  Chief complaint: shortness of breath  HPI:  Jodi Bell is a 60 y.o. female with a history of ESDR on HD through [facility], HTN, DM, obesity and OSA who presented to the ER with shortness of breath.  Shortness of breath has gotten worse since she's been here and she has been coughing more.  She did also have n/v yesterday.  She was recently diagnosed with influenza last Thursday.  She is at [facility] through [place].  Called her outpatient HD unit for orders below.  Missed 5/18 as well - before that has been doing well with attending for several months per nursing.  The patient states that she was too weak to get ready and she states was also found to have low blood sugar.   PMHx:   Past Medical History:  Diagnosis Date  . Anemia   . Arthritis    oa and psoriatic ra  . Asthma   . Back pain, chronic    lower back  . Candida infection finished tx 2 days ago  . Chronic insomnia    ESRD   . COPD (chronic obstructive pulmonary disease) ([place])   . Diabetes mellitus    type 2  . Eczema   . Essential hypertension   . GERD (gastroesophageal reflux disease)   . History of hiatal hernia   . Obesity   . Pneumonia few yrs ago x 2  . Sleep apnea     Past Surgical History:  Procedure Laterality Date  . BACK SURGERY  2016   lower back fusion with cage  . BIOPSY  09/23/2017   Procedure: BIOPSY;  Surgeon: Wonda Horner, MD;  Location: [facility];  Service: Endoscopy;;  . BIOPSY  05/19/2019   Procedure: BIOPSY;  Surgeon: Otis Brace, MD;  Location: [facility];  Service: Gastroenterology;;  . CESAREAN SECTION  1990   x 1   . COLONOSCOPY WITH PROPOFOL N/A 09/23/2017   Procedure: COLONOSCOPY WITH PROPOFOL Hemostatic clips placed;  Surgeon: Wonda Horner, MD;  Location: [facility];  Service: Endoscopy;  Laterality: N/A;  . DILATION AND  CURETTAGE OF UTERUS  1988  . ESOPHAGOGASTRODUODENOSCOPY (EGD) WITH PROPOFOL N/A 09/23/2017   Procedure: ESOPHAGOGASTRODUODENOSCOPY (EGD) WITH PROPOFOL;  Surgeon: Wonda Horner, MD;  Location: [facility];  Service: Endoscopy;  Laterality: N/A;  . ESOPHAGOGASTRODUODENOSCOPY (EGD) WITH PROPOFOL N/A 05/19/2019   Procedure: ESOPHAGOGASTRODUODENOSCOPY (EGD) WITH PROPOFOL;  Surgeon: Otis Brace, MD;  Location: [facility];  Service: Gastroenterology;  Laterality: N/A;  . GIVENS CAPSULE STUDY N/A 05/19/2019   Procedure: GIVENS CAPSULE STUDY;  Surgeon: Otis Brace, MD;  Location: [facility];  Service: Gastroenterology;  Laterality: N/A;  . HOT HEMOSTASIS N/A 05/19/2019   Procedure: HOT HEMOSTASIS (ARGON PLASMA COAGULATION/BICAP);  Surgeon: Otis Brace, MD;  Location: [facility]COPY;  Service: Gastroenterology;  Laterality: N/A;  . PARTIAL KNEE ARTHROPLASTY Left 11/12/2017   Procedure: left unicompartmental arthroplasty-medial;  Surgeon: Paralee Cancel, MD;  Location: [facility];  Service: Orthopedics;  Laterality: Left;  74mn  . SUBMUCOSAL INJECTION  09/23/2017   Procedure: SUBMUCOSAL INJECTION;  Surgeon: GWonda Horner MD;  Location: [facility];  Service: Endoscopy;;  in colon  . TUBAL LIGATION  1990    Family Hx:  Family History  Problem Relation Age of Onset  . Lung cancer Mother   . Diabetes Sister   . Heart disease Sister   .  Asthma Daughter   . Arthritis Daughter   . Arthritis Daughter   . Thyroid cancer Other        1/2 sister  . Heart disease Other        1/2 sister    Social History:  reports that she has been smoking cigarettes. She has a 20.00 pack-year smoking history. She has never used smokeless tobacco. She reports current alcohol use. She reports that she does not use drugs.  Allergies:  Allergies  Allergen Reactions  . Banana Anaphylaxis and Swelling    TONGUE AND MOUTH SWELLING  . Black Walnut Flavor Anaphylaxis    Walnuts  . Hazelnut ([place]) Allergy  Skin Test Anaphylaxis    Hazelnuts  . No Healtht[employer] Food Allergies Anaphylaxis    Spicy Mustard 4.7.2021 Pt reports that she eats Regular Yellow Mustard  . Pecan Nut (Diagnostic) Anaphylaxis    Pecans  . Adalimumab Other (See Comments)    Blisters on abdomen =humira  . Ezetimibe Other (See Comments)    myalgias  . Ibuprofen Other (See Comments)    Renal Problems  . Leflunomide And Related Other (See Comments)    Severe Headache  . Lexapro [Escitalopram Oxalate] Other (See Comments)    Hand problems - Serotonin Syndrome   . Lisinopril Swelling    Angioedema   . Secukinumab     Cosentyx = angiodema  . Statins Other (See Comments)    Leg pains and weakness  . Trazodone And Nefazodone Other (See Comments)    Serotonin Syndrome     Medications: Prior to Admission medications   Medication Sig Start Date End Date Taking? Authorizing Provider  acetaminophen (TYLENOL) 500 MG tablet Take 1,000 mg by mouth every 6 (six) hours as needed for mild pain.    [provider]  allopurinol (ZYLOPRIM) 100 MG tablet Take 100 mg by mouth every evening.    [provider]  amLODipine (NORVASC) 10 MG tablet Take 10 mg by mouth daily.    [provider]  budesonide-formoterol (SYMBICORT) 160-4.5 MCG/ACT inhaler Inhale 2 puffs into the lungs 2 (two) times daily.    [provider]  calcium acetate (PHOSLO) 667 MG capsule Take 3 capsules by mouth in the morning and at bedtime. 10/25/18   [provider]  carboxymethylcellulose 1 % ophthalmic solution Place 1 drop into both eyes 3 (three) times daily.    [provider]  cetirizine (ZYRTEC) 10 MG tablet Take 10 mg by mouth 2 (two) times daily as needed (hives).  06/13/17   Shelda Pal, DO  cinacalcet (SENSIPAR) 30 MG tablet Take 30 mg by mouth MWF.    [provider]  cloNIDine (CATAPRES) 0.3 MG tablet Take 1 tablet (0.3 mg total) by mouth 3 (three) times daily. 05/21/19   Hosie Poisson, MD  ELDERBERRY PO Take 2 drops by mouth daily.    [provider]  EPINEPHrine (EPIPEN) 0.3 mg/0.3 mL SOAJ injection Inject 0.3 mLs (0.3 mg total) into the muscle once. Patient taking differently: Inject 0.3 mg into the muscle as needed (for allergic reaction).  09/30/12   Le, Thao P, DO  esomeprazole (NEXIUM) 20 MG capsule Take 2 capsules (40 mg total) by mouth daily. 05/21/19   Hosie Poisson, MD  ferrous sulfate 325 (65 FE) MG tablet Take 325 mg by mouth 3 (three) times a week. TUE THUR SAT    [provider]  fluticasone (FLONASE) 50 MCG/ACT nasal spray Place 1 spray into both nostrils daily.  [provider]  folic acid (FOLVITE) [id number] MCG tablet Take 400 mcg by mouth daily.     [provider]  furosemide (LASIX) 40 MG tablet Take 40 mg by mouth 2 (two) times daily.     [provider]  gabapentin (NEURONTIN) 300 MG capsule Take 1 capsule (300 mg total) by mouth at bedtime as needed (Neuropathic pain). Patient taking differently: Take 300 mg by mouth daily with supper.  12/14/17 05/18/19  Dana Allan I, MD  hydrALAZINE (APRESOLINE) 50 MG tablet Take 1 tablet (50 mg total) by mouth every 8 (eight) hours. Patient taking differently: Take 100 mg by mouth every 8 (eight) hours.  12/14/17   Bonnell Public, MD  isosorbide mononitrate (IMDUR) 60 MG 24 hr tablet Take 1 tablet (60 mg total) by mouth daily. 03/02/18   Thurnell Lose, MD  JANUVIA 25 MG tablet Take 25 mg by mouth daily.  08/09/15   [provider]  labetalol (NORMODYNE) 200 MG tablet Take 400 mg by mouth 3 (three) times daily.    [provider]  LORazepam (ATIVAN) 0.5 MG tablet Take 0.5 mg by mouth every 8 (eight) hours as needed for anxiety. 05/02/19   [provider]  methocarbamol (ROBAXIN) 500 MG tablet Take 500 mg by mouth 4 (four) times daily as needed for muscle pain. neck 05/01/19   [provider]  montelukast (SINGULAIR) 10 MG tablet  Take 10 mg by mouth daily with supper.     [provider]  multivitamin (RENA-VIT) TABS tablet Take 1 tablet by mouth daily with supper.    [provider]  olmesartan (BENICAR) 40 MG tablet Take 40 mg by mouth daily. 05/06/19   [provider]  Omega-3 1000 MG CAPS Take 1,000 mg by mouth in the morning, at noon, and at bedtime.    [provider]  Oxycodone HCl 10 MG TABS Take 1 tablet (10 mg total) by mouth every 6 (six) hours as needed (pain score 7-10). 09/15/18   Black, Lezlie Octave, NP  predniSONE (DELTASONE) 5 MG tablet Take 5 mg by mouth daily with breakfast.     [provider]  Probiotic Product (PROBIOTIC PO) Take 1 capsule by mouth daily.    [provider]  senna (SENOKOT) 8.6 MG tablet Take 1-2 tablets by mouth daily as needed for constipation.    [provider]  Tiotropium Bromide Monohydrate (SPIRIVA RESPIMAT IN) Inhale 2 puffs into the lungs daily.    [provider]  VENTOLIN HFA 108 (90 Base) MCG/ACT inhaler Inhale 1-2 puffs into the lungs every 6 (six) hours as needed for shortness of breath. wheezing 06/13/15   [provider]  XELJANZ 5 MG TABS Take 1 tablet by mouth at bedtime. 05/17/19   [provider]  zolpidem (AMBIEN) 5 MG tablet Take 5 mg by mouth at bedtime. 05/05/19   [provider]    I have reviewed the patient's current and reported prior to admission medications.  Labs:  BMP Latest Ref Rng & Units 07/03/2020 06/28/2020 05/22/2020  Glucose 70 - 99 mg/dL 47(L) 76 176(H)  BUN 6 - 20 mg/dL 22(H) 19 12  Creatinine 0.44 - 1.00 mg/dL 11.17(H) 7.85(H) 5.97(H)  Sodium 135 - 145 mmol/L 130(L) 130(L) 131(L)  Potassium 3.5 - 5.1 mmol/L 4.9 4.2 4.1  Chloride 98 - 111 mmol/L 96(L) 95(L) 95(L)  CO2 22 - 32 mmol/L 20(L) 26 27  Calcium 8.9 - 10.3 mg/dL 8.4(L) 9.2 8.3(L)  Urinalysis    Component Value Date/Time   COLORURINE YELLOW 08/17/2015 1621   APPEARANCEUR CLEAR 08/17/2015 1621    LABSPEC 1.015 08/17/2015 1621   PHURINE 5.5 08/17/2015 1621   GLUCOSEU NEGATIVE 08/17/2015 1621   HGBUR NEGATIVE 08/17/2015 1621   BILIRUBINUR NEGATIVE 08/17/2015 1621   KETONESUR NEGATIVE 08/17/2015 1621   PROTEINUR 100 (A) 08/17/2015 1621   UROBILINOGEN 0.2 09/05/2014 0013   NITRITE NEGATIVE 08/17/2015 1621   LEUKOCYTESUR NEGATIVE 08/17/2015 1621     ROS:  Pertinent items noted in HPI and remainder of comprehensive ROS otherwise negative.  Physical Exam: Vitals:   07/03/20 1400 07/03/20 1500  BP: (!) 162/69 (!) 149/79  Pulse: 73 74  Resp: 19 20  Temp:    SpO2: 99% 90%     General: adult female in bed in NAD at rest, short of breath with exertion HEENT: NCAT Eyes: EOMI sclera anicteric Neck: supple trachea midline Heart:S1S2 no rub Lungs: crackles occ wheeze; on room air; increased work of breathing with exertion Abdomen:  Soft/NT/ND Extremities: no pitting edema appreciated; no cyanosis or clubbing Skin: no rash on extremities exposed Neuro: alert and oriented x 3 provides hx and follows commands Access - LUE AVF bruit and thrill   Outpatient HD orders:  [facility] MWF Left arm AVF EDW 72.5 kg  3.5 hours Didn't come 5/23; last HD was 5/20 with post weight 72.5 kg  2/2.5 Ca  Heparin 3200 bolus and 500 hourly aranesp 85 mcg once weekly last dose on 5/20 venofer 50 mg weekly hectorol out - now on zemplar 6 mcg each treatment Normally between 1.5 - 2.5 kg between treatment  Assessment/Plan:  # ESRD - HD today  - she still needs covid swab - get back on schedule for HD as staffing permits  # Flu positive - per primary team - optimize volume status with HD  # HTN  - Continue home regimen per team and optimize volume status with HD   # Anemia CKD  - s/p recent aranesp dose on 5/20 - no ESA indicated at this time   # Metabolic bone disease - zemplar 6 mcg each tx outpatient and on sensipar 30 mg MWF at home (not daily as on bottle - per pt has been  reduced)   Claudia Desanctis 07/03/2020, 4:24 PM

## 2020-07-03 NOTE — H&P (Addendum)
[facility] Admission History and Physical Service Pager: [phone]  Patient name: Jodi Bell Medical record number: [id number] Date of birth: 04/10/1960 Age: 60 y.o. Gender: female  Primary Care Provider: [name]., MD Consultants: Nephro Code Status: Full Preferred Emergency Contact: Daughter Dameika  Chief Complaint: Weakness  Assessment and Plan: Jodi Bell is a 60 y.o. female presenting with weakness. 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     Weakness Patient began experienceing this morning.  On arrival patient's blood sugars in 40's.  Improved with D10 bolus. VSS.  Thought likely due to hypoglycemia in setting of Viral URI given symptoms.  Afebrile. WBC-3.6.  Less likely COPD exacerbtion given minimal sputum production.   Unlikely PE given Wells score of 0.  Last ECHO in 2019, showed Grade 1 Diastolic Dysfuction with mildl septal hypertrophy.  Will obtain ECHO given Cardiomegaly on exam and suspected cardiomegaly on chest X-Ray.    Patient also ESRD with MWF and has missed dialysis on Monday.  Plan to admit for observation and dialysis. - Admit to FPTS, Med/Surg, Attneding Dr Nori Riis - Vital signs q4hr - CBG q2hr, then q4h - Renal diet - Daily weights - f/u ECHO   ESRD on MWF HD Last dialysis session was on Friday.  Dry weight is 73 kg.  Indicates she couldn't go on Monday as she was sick.  Had planned to go this morning but felt very weak when trying to get up.  Indicates has had difficulty breathing today.  On exam has crackles in lung bases and systolic murmur at right sternal border.  Plan for dialysis today. - Nephro following, appreciate recs - Plan for Dialysis tonight  Chronic Nausea Patient takes Zofran at home.  Holding in setting of Prolonged QTc - Can consider restarting Zofran if develops nausea   COPD/Asthma  Has had only mild amount of clear sputum  production with cough.  On Spiriva 2 puffs BID, Singulair daily, and Albuterol as needed.   - Wean down O2 as able, goal O2 88-92%  Type 2 DM Last A1C was 5.6 on 02/17/20.  On Januvia 25 mg daily.  Last took yesterday. - Hold Januvia in setting of Hypoglycemia  HTN On multiple medications for management.  On Amlodipine 10 mg daily, Clonidine 0.3 mg 3 times daily, Lasix 40 mg twice daily, Hydralazine 2 times daily, Imdur 60 mg daily, Labetalol 200 mg 2 tabs 3 times daily, Olmesartan 40 mg daily - Continue Home medications  Rheumatoid Arthritis On Xeljanz 5 mg daily. Was taking Prednisone 5 mg daily, but stopped about 2 weeks ago. Was discontinued by her Rheumatologist. She is currently in the process of looking for a new Rheumatologist. - follow up with Pharm regarding when started taking  Anxiety Takes Ativan as needed for anxiety. - Hold Ativan  Insomnia Receives Ambien nightly - Will hold ambien currently to avoid sedation  Neck Pain -Takes Felexeril 10 mg TID.  Tobacco Use Currently smoking  - Nicotine patch   FEN/GI: Renal diet, w/fluid restriction Prophylaxis: Heparin  Disposition: Med-Surg  History of Present Illness:  Jodi Bell is a 60 y.o. female presenting with weakness.  Indicates tested positive for flu on Thursday.  Since then has had cough, malaise, n/v, and weakness.   Has MWF dialysis and missed Monday session due to feeling sick and was feeling too weak to go today.  Also endorses having chronic  nausea.    Review Of Systems: Per HPI with the following additions:  Review of Systems  Constitutional: Positive for activity change and appetite change. Negative for fever.  Respiratory: Positive for cough and shortness of breath. Negative for chest tightness.   Cardiovascular: Positive for leg swelling. Negative for chest pain.  Gastrointestinal: Positive for nausea and vomiting. Negative for abdominal pain.  Endocrine: Positive for polydipsia.   Neurological: Positive for weakness.     Patient Active Problem List   Diagnosis Date Noted  . Dyspnea 07/03/2020  . GI bleeding 05/17/2019  . Volume overload state of heart 09/14/2018  . Intervertebral cervical disc disorder with myelopathy, cervical region 09/14/2018  . OSA (obstructive sleep apnea) 09/14/2018  . Asthma 05/16/2018  . Suspected COVID-19 virus infection 05/16/2018  . ESRD (end stage renal disease) ([place]) 05/16/2018  . Fluid overload 02/27/2018  . Chest pain, rule out acute myocardial infarction 12/12/2017  . Status post left partial knee replacement 11/12/2017  . Hematochezia 09/20/2017  . GERD (gastroesophageal reflux disease) 09/20/2017  . 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 2 diabetes mellitus ([place]) 09/20/2017  . COPD (chronic obstructive pulmonary disease) ([place]) 09/20/2017  . Tobacco use 09/20/2017  . Symptomatic anemia 09/20/2017  . Headache 09/20/2017  . Serotonin syndrome 09/28/2013  . Acute respiratory failure with hypoxia ([place]) 09/28/2013  . Encephalopathy acute 09/28/2013  . Hypotension, unspecified 09/28/2013  . 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 Type 2 diabetes mellitus ([place]) 03/06/2008  . INSOMNIA, CHRONIC 03/06/2008  . Essential hypertension 03/06/2008  . ASTHMA 03/06/2008  . ECZEMA 03/06/2008  . BACK PAIN, LUMBAR, CHRONIC 03/06/2008    Past Medical History: Past Medical History:  Diagnosis Date  . Anemia   . Arthritis    oa and psoriatic ra  . Asthma   . Back pain, chronic    lower back  . Candida infection finished tx 2 days ago  . Chronic insomnia   . Chronic kidney disease    stage 3 ckd last ov nephrology 04-29-17 epic  . Chronic kidney disease (CKD) stage G4/A1, severely decreased glomerular filtration rate (GFR) between 15-29 mL/min/1.73 square meter and albuminuria creatinine ratio less than 30 mg/g (HCC)   . COPD (chronic obstructive pulmonary disease) ([place])   . Diabetes mellitus    type 2  . Eczema   . Essential hypertension    . GERD (gastroesophageal reflux disease)   . History of hiatal hernia   . Obesity   . Pneumonia few yrs ago x 2  . Sleep apnea     Past Surgical History: Past Surgical History:  Procedure Laterality Date  . BACK SURGERY  2016   lower back fusion with cage  . BIOPSY  09/23/2017   Procedure: BIOPSY;  Surgeon: Wonda Horner, MD;  Location: [facility];  Service: Endoscopy;;  . BIOPSY  05/19/2019   Procedure: BIOPSY;  Surgeon: Otis Brace, MD;  Location: [place];  Service: Gastroenterology;;  . CESAREAN SECTION  1990   x 1   . COLONOSCOPY WITH PROPOFOL N/A 09/23/2017   Procedure: COLONOSCOPY WITH PROPOFOL Hemostatic clips placed;  Surgeon: Wonda Horner, MD;  Location: [facility];  Service: Endoscopy;  Laterality: N/A;  . DILATION AND CURETTAGE OF UTERUS  1988  . ESOPHAGOGASTRODUODENOSCOPY (EGD) WITH PROPOFOL N/A 09/23/2017   Procedure: ESOPHAGOGASTRODUODENOSCOPY (EGD) WITH PROPOFOL;  Surgeon: Wonda Horner, MD;  Location: [facility];  Service: Endoscopy;  Laterality: N/A;  . ESOPHAGOGASTRODUODENOSCOPY (EGD) WITH PROPOFOL N/A 05/19/2019   Procedure: ESOPHAGOGASTRODUODENOSCOPY (EGD)  WITH PROPOFOL;  Surgeon: Otis Brace, MD;  Location: [facility];  Service: Gastroenterology;  Laterality: N/A;  . GIVENS CAPSULE STUDY N/A 05/19/2019   Procedure: GIVENS CAPSULE STUDY;  Surgeon: Otis Brace, MD;  Location: [place];  Service: Gastroenterology;  Laterality: N/A;  . HOT HEMOSTASIS N/A 05/19/2019   Procedure: HOT HEMOSTASIS (ARGON PLASMA COAGULATION/BICAP);  Surgeon: Otis Brace, MD;  [facility];  Service: Gastroenterology;  Laterality: N/A;  . PARTIAL KNEE ARTHROPLASTY Left 11/12/2017   Procedure: left unicompartmental arthroplasty-medial;  Surgeon: Paralee Cancel, MD;  Location: [facility];  Service: Orthopedics;  Laterality: Left;  36mn  . SUBMUCOSAL INJECTION  09/23/2017   Procedure: SUBMUCOSAL INJECTION;  Surgeon: GWonda Horner MD;  Location: [facility];  Service: Endoscopy;;  in colon  . TUBAL LIGATION  1990    Social History: Social History   Tobacco Use  . Smoking status: Current Every Day Smoker    Packs/day: 0.50    Years: 40.00    Pack years: 20.00    Types: Cigarettes  . Smokeless tobacco: Never Used  Vaping Use  . Vaping Use: Never used  Substance Use Topics  . Alcohol use: Yes    Comment: occ  . Drug use: No   Additional social history: Please also refer to relevant sections of EMR.  Family History: Family History  Problem Relation Age of Onset  . Lung cancer Mother   . Diabetes Sister   . Heart disease Sister   . Asthma Daughter   . Arthritis Daughter   . Arthritis Daughter   . Thyroid cancer Other        1/2 sister  . Heart disease Other        1/2 sister    Allergies and Medications: Allergies  Allergen Reactions  . Banana Anaphylaxis and Swelling    TONGUE AND MOUTH SWELLING  . Black Walnut Flavor Anaphylaxis    Walnuts  . Hazelnut ([place]) Allergy Skin Test Anaphylaxis    Hazelnuts  . No Healthtouch Food Allergies Anaphylaxis    Spicy Mustard 4.7.2021 Pt reports that she eats Regular Yellow Mustard  . Pecan Nut (Diagnostic) Anaphylaxis    Pecans  . Adalimumab Other (See Comments)    Blisters on abdomen =humira  . Ezetimibe Other (See Comments)    myalgias  . Ibuprofen Other (See Comments)    Renal Problems  . Leflunomide And Related Other (See Comments)    Severe Headache  . Lexapro [Escitalopram Oxalate] Other (See Comments)    Hand problems - Serotonin Syndrome   . Lisinopril Swelling    Angioedema   . Secukinumab     Cosentyx = angiodema  . Statins Other (See Comments)    Leg pains and weakness  . Trazodone And Nefazodone Other (See Comments)    Serotonin Syndrome    No current facility-administered medications on file prior to encounter.   Current Outpatient Medications on File Prior to Encounter  Medication Sig Dispense Refill  . acetaminophen (TYLENOL) 500 MG  tablet Take 1,000 mg by mouth every 6 (six) hours as needed for mild pain.    .Marland Kitchenallopurinol (ZYLOPRIM) 100 MG tablet Take 100 mg by mouth every evening.    .Marland KitchenamLODipine (NORVASC) 10 MG tablet Take 10 mg by mouth daily.    . budesonide-formoterol (SYMBICORT) 160-4.5 MCG/ACT inhaler Inhale 2 puffs into the lungs 2 (two) times daily.    . calcium acetate (PHOSLO) 667 MG capsule Take 3 capsules by mouth in the morning and at  bedtime.    . carboxymethylcellulose 1 % ophthalmic solution Place 1 drop into both eyes 3 (three) times daily.    . cetirizine (ZYRTEC) 10 MG tablet Take 10 mg by mouth 2 (two) times daily as needed (hives).     . cinacalcet (SENSIPAR) 30 MG tablet Take 30 mg by mouth daily.    . cloNIDine (CATAPRES) 0.3 MG tablet Take 1 tablet (0.3 mg total) by mouth 3 (three) times daily.    Marland Kitchen ELDERBERRY PO Take 2 drops by mouth daily.    Marland Kitchen EPINEPHrine (EPIPEN) 0.3 mg/0.3 mL SOAJ injection Inject 0.3 mLs (0.3 mg total) into the muscle once. (Patient taking differently: Inject 0.3 mg into the muscle as needed (for allergic reaction). ) 2 Device 1  . esomeprazole (NEXIUM) 20 MG capsule Take 2 capsules (40 mg total) by mouth daily. 60 capsule 1  . ferrous sulfate 325 (65 FE) MG tablet Take 325 mg by mouth 3 (three) times a week. TUE THUR SAT    . fluticasone (FLONASE) 50 MCG/ACT nasal spray Place 1 spray into both nostrils daily.     . folic acid (FOLVITE) [id number] MCG tablet Take 400 mcg by mouth daily.     . furosemide (LASIX) 40 MG tablet Take 40 mg by mouth 2 (two) times daily.     Marland Kitchen gabapentin (NEURONTIN) 300 MG capsule Take 1 capsule (300 mg total) by mouth at bedtime as needed (Neuropathic pain). (Patient taking differently: Take 300 mg by mouth daily with supper. ) 30 capsule 2  . hydrALAZINE (APRESOLINE) 50 MG tablet Take 1 tablet (50 mg total) by mouth every 8 (eight) hours. (Patient taking differently: Take 100 mg by mouth every 8 (eight) hours. ) 90 tablet 1  . isosorbide mononitrate (IMDUR)  60 MG 24 hr tablet Take 1 tablet (60 mg total) by mouth daily. 30 tablet 0  . JANUVIA 25 MG tablet Take 25 mg by mouth daily.     Marland Kitchen labetalol (NORMODYNE) 200 MG tablet Take 400 mg by mouth 3 (three) times daily.    Marland Kitchen LORazepam (ATIVAN) 0.5 MG tablet Take 0.5 mg by mouth every 8 (eight) hours as needed for anxiety.    . methocarbamol (ROBAXIN) 500 MG tablet Take 500 mg by mouth 4 (four) times daily as needed for muscle pain. neck    . montelukast (SINGULAIR) 10 MG tablet Take 10 mg by mouth daily with supper.     . multivitamin (RENA-VIT) TABS tablet Take 1 tablet by mouth daily with supper.    . olmesartan (BENICAR) 40 MG tablet Take 40 mg by mouth daily.    . Omega-3 1000 MG CAPS Take 1,000 mg by mouth in the morning, at noon, and at bedtime.    . Oxycodone HCl 10 MG TABS Take 1 tablet (10 mg total) by mouth every 6 (six) hours as needed (pain score 7-10).    . predniSONE (DELTASONE) 5 MG tablet Take 5 mg by mouth daily with breakfast.     . Probiotic Product (PROBIOTIC PO) Take 1 capsule by mouth daily.    Marland Kitchen senna (SENOKOT) 8.6 MG tablet Take 1-2 tablets by mouth daily as needed for constipation.    . Tiotropium Bromide Monohydrate (SPIRIVA RESPIMAT IN) Inhale 2 puffs into the lungs daily.    . VENTOLIN HFA 108 (90 Base) MCG/ACT inhaler Inhale 1-2 puffs into the lungs every 6 (six) hours as needed for shortness of breath. wheezing    . XELJANZ 5 MG TABS Take 1 tablet  by mouth at bedtime.    Marland Kitchen zolpidem (AMBIEN) 5 MG tablet Take 5 mg by mouth at bedtime.      Objective: BP (!) 167/78   Pulse 73   Temp (!) 97.5 F (36.4 C) (Oral)   Resp 16   SpO2 98%  Exam:  Physical Exam Constitutional:      General: She is not in acute distress.    Appearance: Normal appearance.  HENT:     Head: Normocephalic and atraumatic.  Cardiovascular:     Rate and Rhythm: Normal rate and regular rhythm.     Heart sounds: Murmur heard.      Comments: Systolic murmur  Pulmonary:     Effort: Pulmonary  effort is normal.     Breath sounds: Rales present.     Comments: Breathing comfortably on 4L Nasal Canula, crackles in bases of lungs Musculoskeletal:     Comments: Mild non-pitting edema in legs bilaterally  Skin:    General: Skin is warm.  Neurological:     Mental Status: She is alert.     Labs and Imaging: CBC BMET  Recent Labs  Lab 07/03/20 1025  WBC 3.6*  HGB 11.0*  HCT 35.6*  PLT 200   Recent Labs  Lab 07/03/20 1025  NA 130*  K 4.9  CL 96*  CO2 20*  BUN 22*  CREATININE 11.17*  GLUCOSE 47*  CALCIUM 8.4*     EKG: Normal sinus rhythm, left axis deviation   Delora Fuel, MD 07/03/2020, 4:22 PM PGY-1, [place] Intern pager: [phone], text pages welcome   FPTS Upper-Level Resident Addendum   I have independently interviewed and examined the patient. I have discussed the above with the original author and agree with their documentation.  Please see also any attending notes.    Carollee Leitz MD PGY-2, [facility] 07/04/2020 6:37 AM  FPTS Service pager: [phone] (text pages welcome through [facility])

## 2020-07-03 NOTE — Progress Notes (Incomplete)
Patient req for her hydralazine,ambien and flexeril meds

## 2020-07-03 NOTE — ED Notes (Signed)
Report given to 5MW.

## 2020-07-03 NOTE — ED Notes (Signed)
Pt given 8oz of coke for CBG 42

## 2020-07-03 NOTE — Progress Notes (Incomplete)
New Admission Note:   Arrival Method:  Mental Orientation: Telemetry:  Assessment: Completed Skin: IV: Pain:  Tubes: Safety Measures: Safety Fall Prevention Plan has been discussed.  Admission: Completed 5MW Orientation: Patient has been oriented to the room, unit and staff.  Family:  Orders have been reviewed and implemented. Will continue to monitor the patient. Call light has been placed within reach and bed alarm has been activated.   Renna Kilmer BSN, RN-BC Phone number: [phone] 

## 2020-07-03 NOTE — ED Triage Notes (Signed)
Pt arrived by EMS from home c/o weakness and feeling light headed today Has diagnosed with the flu last week and missed her dialysis yesterday due to feeling unwell.   Pt states today when she tried to stand her legs felt very weak and she got lightheaded and called EMS.

## 2020-07-03 NOTE — ED Provider Notes (Signed)
[facility] EMERGENCY DEPARTMENT Provider Note   CSN: [id number] Arrival date & time: 07/03/20  1006     History Chief Complaint  Patient presents with  . Weakness    Jodi Bell is a 60 y.o. female with a pmh of ESRD on HD M/w/F with last full dialysis last Friday (4 days ago.)  Patient was diagnosed with influenza last Thursday.  Has a history of chronic nausea.  Patient states that since she had the flu she has had extremely poor appetite and has barely been able to eat anything because she is constantly nauseated whenever she thinks about eating food.  This morning she got a call from the nurse at her dialysis clinic who told her that she could go ahead and come in for full dialysis since she missed yesterday however when she stood up she felt like she was too weak to stand and felt like she was going to pass out.  She denies any active fevers.  She has a mild cough.  She has not been taking any new medications.  HPI     Past Medical History:  Diagnosis Date  . Anemia   . Arthritis    oa and psoriatic ra  . Asthma   . Back pain, chronic    lower back  . Candida infection finished tx 2 days ago  . Chronic insomnia   . Chronic kidney disease    stage 3 ckd last ov nephrology 04-29-17 epic  . Chronic kidney disease (CKD) stage G4/A1, severely decreased glomerular filtration rate (GFR) between 15-29 mL/min/1.73 square meter and albuminuria creatinine ratio less than 30 mg/g (HCC)   . COPD (chronic obstructive pulmonary disease) ([place])   . Diabetes mellitus    type 2  . Eczema   . Essential hypertension   . GERD (gastroesophageal reflux disease)   . History of hiatal hernia   . Obesity   . Pneumonia few yrs ago x 2  . Sleep apnea     Patient Active Problem List   Diagnosis Date Noted  . GI bleeding 05/17/2019  . Volume overload state of heart 09/14/2018  . Intervertebral cervical disc disorder with myelopathy, cervical region 09/14/2018  . OSA  (obstructive sleep apnea) 09/14/2018  . Asthma 05/16/2018  . Suspected COVID-19 virus infection 05/16/2018  . ESRD (end stage renal disease) ([place]) 05/16/2018  . Fluid overload 02/27/2018  . Chest pain, rule out acute myocardial infarction 12/12/2017  . Status post left partial knee replacement 11/12/2017  . Hematochezia 09/20/2017  . GERD (gastroesophageal reflux disease) 09/20/2017  . 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 2 diabetes mellitus ([place]) 09/20/2017  . COPD (chronic obstructive pulmonary disease) ([place]) 09/20/2017  . Tobacco use 09/20/2017  . Symptomatic anemia 09/20/2017  . Headache 09/20/2017  . Serotonin syndrome 09/28/2013  . Acute respiratory failure with hypoxia ([place]) 09/28/2013  . Encephalopathy acute 09/28/2013  . Hypotension, unspecified 09/28/2013  . 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 Type 2 diabetes mellitus ([place]) 03/06/2008  . INSOMNIA, CHRONIC 03/06/2008  . Essential hypertension 03/06/2008  . ASTHMA 03/06/2008  . ECZEMA 03/06/2008  . BACK PAIN, LUMBAR, CHRONIC 03/06/2008    Past Surgical History:  Procedure Laterality Date  . BACK SURGERY  2016   lower back fusion with cage  . BIOPSY  09/23/2017   Procedure: BIOPSY;  Surgeon: Wonda Horner, MD;  Location: [facility];  Service: Endoscopy;;  . BIOPSY  05/19/2019   Procedure: BIOPSY;  Surgeon: Otis Brace, MD;  Location: [facility];  Service: Gastroenterology;;  . CESAREAN SECTION  1990   x 1   . COLONOSCOPY WITH PROPOFOL N/A 09/23/2017   Procedure: COLONOSCOPY WITH PROPOFOL Hemostatic clips placed;  Surgeon: Wonda Horner, MD;  Location: [facility];  Service: Endoscopy;  Laterality: N/A;  . DILATION AND CURETTAGE OF UTERUS  1988  . ESOPHAGOGASTRODUODENOSCOPY (EGD) WITH PROPOFOL N/A 09/23/2017   Procedure: ESOPHAGOGASTRODUODENOSCOPY (EGD) WITH PROPOFOL;  Surgeon: Wonda Horner, MD;  Location: [facility];  Service: Endoscopy;  Laterality: N/A;  . ESOPHAGOGASTRODUODENOSCOPY (EGD) WITH PROPOFOL N/A  05/19/2019   Procedure: ESOPHAGOGASTRODUODENOSCOPY (EGD) WITH PROPOFOL;  Surgeon: Otis Brace, MD;  Location: [facility];  Service: Gastroenterology;  Laterality: N/A;  . GIVENS CAPSULE STUDY N/A 05/19/2019   Procedure: GIVENS CAPSULE STUDY;  Surgeon: Otis Brace, MD;  Location: [facility];  Service: Gastroenterology;  Laterality: N/A;  . HOT HEMOSTASIS N/A 05/19/2019   Procedure: HOT HEMOSTASIS (ARGON PLASMA COAGULATION/BICAP);  Surgeon: Otis Brace, MD;  Location: [facility] ENDOSCOPY;  Service: Gastroenterology;  Laterality: N/A;  . PARTIAL KNEE ARTHROPLASTY Left 11/12/2017   Procedure: left unicompartmental arthroplasty-medial;  Surgeon: Paralee Cancel, MD;  Location: [facility];  Service: Orthopedics;  Laterality: Left;  42mn  . SUBMUCOSAL INJECTION  09/23/2017   Procedure: SUBMUCOSAL INJECTION;  Surgeon: GWonda Horner MD;  Location: [facility];  Service: Endoscopy;;  in colon  . TUBAL LIGATION  1990     OB History   No obstetric history on file.     Family History  Problem Relation Age of Onset  . Lung cancer Mother   . Diabetes Sister   . Heart disease Sister   . Asthma Daughter   . Arthritis Daughter   . Arthritis Daughter   . Thyroid cancer Other        1/2 sister  . Heart disease Other        1/2 sister    Social History   Tobacco Use  . Smoking status: Current Every Day Smoker    Packs/day: 0.50    Years: 40.00    Pack years: 20.00    Types: Cigarettes  . Smokeless tobacco: Never Used  Vaping Use  . Vaping Use: Never used  Substance Use Topics  . Alcohol use: Yes    Comment: occ  . Drug use: No    Home Medications Prior to Admission medications   Medication Sig Start Date End Date Taking? Authorizing Provider  acetaminophen (TYLENOL) 500 MG tablet Take 1,000 mg by mouth every 6 (six) hours as needed for mild pain.    [provider]  allopurinol (ZYLOPRIM) 100 MG tablet Take 100 mg by mouth every evening.    [provider]   amLODipine (NORVASC) 10 MG tablet Take 10 mg by mouth daily.    [provider]  budesonide-formoterol (SYMBICORT) 160-4.5 MCG/ACT inhaler Inhale 2 puffs into the lungs 2 (two) times daily.    [provider]  calcium acetate (PHOSLO) 667 MG capsule Take 3 capsules by mouth in the morning and at bedtime. 10/25/18   [provider]  carboxymethylcellulose 1 % ophthalmic solution Place 1 drop into both eyes 3 (three) times daily.    [provider]  cetirizine (ZYRTEC) 10 MG tablet Take 10 mg by mouth 2 (two) times daily as needed (hives).  06/13/17   WShelda Pal DO  cinacalcet (SENSIPAR) 30 MG tablet Take 30 mg by mouth daily.    [provider]  cloNIDine (CATAPRES) 0.3 MG tablet Take  1 tablet (0.3 mg total) by mouth 3 (three) times daily. 05/21/19   Hosie Poisson, MD  ELDERBERRY PO Take 2 drops by mouth daily.    [provider]  EPINEPHrine (EPIPEN) 0.3 mg/0.3 mL SOAJ injection Inject 0.3 mLs (0.3 mg total) into the muscle once. Patient taking differently: Inject 0.3 mg into the muscle as needed (for allergic reaction).  09/30/12   Le, Thao P, DO  esomeprazole (NEXIUM) 20 MG capsule Take 2 capsules (40 mg total) by mouth daily. 05/21/19   Hosie Poisson, MD  ferrous sulfate 325 (65 FE) MG tablet Take 325 mg by mouth 3 (three) times a week. TUE THUR SAT    [provider]  fluticasone (FLONASE) 50 MCG/ACT nasal spray Place 1 spray into both nostrils daily.     [provider]  folic acid (FOLVITE) [id number] MCG tablet Take 400 mcg by mouth daily.     [provider]  furosemide (LASIX) 40 MG tablet Take 40 mg by mouth 2 (two) times daily.     [provider]  gabapentin (NEURONTIN) 300 MG capsule Take 1 capsule (300 mg total) by mouth at bedtime as needed (Neuropathic pain). Patient taking differently: Take 300 mg by mouth daily with supper.  12/14/17 05/18/19  Dana Allan I, MD  hydrALAZINE (APRESOLINE)  50 MG tablet Take 1 tablet (50 mg total) by mouth every 8 (eight) hours. Patient taking differently: Take 100 mg by mouth every 8 (eight) hours.  12/14/17   Bonnell Public, MD  isosorbide mononitrate (IMDUR) 60 MG 24 hr tablet Take 1 tablet (60 mg total) by mouth daily. 03/02/18   Thurnell Lose, MD  JANUVIA 25 MG tablet Take 25 mg by mouth daily.  08/09/15   [provider]  labetalol (NORMODYNE) 200 MG tablet Take 400 mg by mouth 3 (three) times daily.    [provider]  LORazepam (ATIVAN) 0.5 MG tablet Take 0.5 mg by mouth every 8 (eight) hours as needed for anxiety. 05/02/19   [provider]  methocarbamol (ROBAXIN) 500 MG tablet Take 500 mg by mouth 4 (four) times daily as needed for muscle pain. neck 05/01/19   [provider]  montelukast (SINGULAIR) 10 MG tablet Take 10 mg by mouth daily with supper.     [provider]  multivitamin (RENA-VIT) TABS tablet Take 1 tablet by mouth daily with supper.    [provider]  olmesartan (BENICAR) 40 MG tablet Take 40 mg by mouth daily. 05/06/19   [provider]  Omega-3 1000 MG CAPS Take 1,000 mg by mouth in the morning, at noon, and at bedtime.    [provider]  Oxycodone HCl 10 MG TABS Take 1 tablet (10 mg total) by mouth every 6 (six) hours as needed (pain score 7-10). 09/15/18   Black, Lezlie Octave, NP  predniSONE (DELTASONE) 5 MG tablet Take 5 mg by mouth daily with breakfast.     [provider]  Probiotic Product (PROBIOTIC PO) Take 1 capsule by mouth daily.    [provider]  senna (SENOKOT) 8.6 MG tablet Take 1-2 tablets by mouth daily as needed for constipation.    [provider]  Tiotropium Bromide Monohydrate (SPIRIVA RESPIMAT IN) Inhale 2 puffs into the lungs daily.    [provider]  VENTOLIN HFA 108 (90 Base) MCG/ACT inhaler Inhale 1-2 puffs into the lungs every 6 (six) hours as needed for shortness of breath. wheezing 06/13/15    [provider]  XELJANZ 5 MG TABS Take 1 tablet by mouth at bedtime. 05/17/19   [provider]  zolpidem (AMBIEN) 5 MG tablet Take 5 mg by mouth at bedtime. 05/05/19   [provider]    Allergies    Banana, Black walnut flavor, Hazelnut (filbert) allergy skin test, No healthtouch food allergies, Pecan nut (diagnostic), Adalimumab, Ezetimibe, Ibuprofen, Leflunomide and related, Lexapro [escitalopram oxalate], Lisinopril, Secukinumab, Statins, and Trazodone and nefazodone  Review of Systems   Review of Systems Ten systems reviewed and are negative for acute change, except as noted in the HPI.   Physical Exam Updated Vital Signs BP (!) 157/65   Pulse 73   Temp (!) 97.5 F (36.4 C) (Oral)   Resp 15   SpO2 92%   Physical Exam Vitals and nursing note reviewed.  Constitutional:      General: She is not in acute distress.    Appearance: She is well-developed. She is not diaphoretic.  HENT:     Head: Normocephalic and atraumatic.  Eyes:     General: No scleral icterus.    Conjunctiva/sclera: Conjunctivae normal.  Cardiovascular:     Rate and Rhythm: Normal rate and regular rhythm.     Heart sounds: Normal heart sounds. No murmur heard. No friction rub. No gallop.   Pulmonary:     Effort: Pulmonary effort is normal. No respiratory distress.     Breath sounds: Normal breath sounds. No rales.  Abdominal:     General: Bowel sounds are normal. There is no distension.     Palpations: Abdomen is soft. There is no mass.     Tenderness: There is no abdominal tenderness. There is no guarding.  Musculoskeletal:     Cervical back: Normal range of motion.     Right lower leg: No edema.     Left lower leg: No edema.  Skin:    General: Skin is warm and dry.  Neurological:     Mental Status: She is alert and oriented to person, place, and time.  Psychiatric:        Behavior: Behavior normal.     ED Results / Procedures / Treatments   Labs (all labs ordered  are listed, but only abnormal results are displayed) Labs Reviewed  CBC WITH DIFFERENTIAL/PLATELET - Abnormal; Notable for the following components:      Result Value   WBC 3.6 (*)    RBC 3.49 (*)    Hemoglobin 11.0 (*)    HCT 35.6 (*)    MCV 102.0 (*)    RDW 15.8 (*)    Neutro Abs 1.6 (*)    All other components within normal limits  CBG MONITORING, ED - Abnormal; Notable for the following components:   Glucose-Capillary 42 (*)    All other components within normal limits  CBG MONITORING, ED - Abnormal; Notable for the following components:   Glucose-Capillary 49 (*)    All other components within normal limits  CBG MONITORING, ED - Abnormal; Notable for the following components:   Glucose-Capillary 67 (*)    All other components within normal limits  COMPREHENSIVE METABOLIC PANEL    EKG None  Radiology DG Chest Port 1 View  Result Date: 07/03/2020 CLINICAL DATA:  Weakness. EXAM: PORTABLE CHEST 1 VIEW COMPARISON:  April 28, 2020. FINDINGS: Stable cardiomegaly is noted with central pulmonary vascular congestion. No pneumothorax is noted. Mild bibasilar atelectasis or edema is noted with small pleural effusions, left greater than right. Bony thorax is unremarkable. IMPRESSION:  Stable cardiomegaly with central pulmonary vascular congestion. Mild bibasilar atelectasis or edema is noted with small pleural effusions. Aortic Atherosclerosis (ICD10-I70.0). Electronically Signed   By: Marijo Conception M.D.   On: 07/03/2020 11:02    Procedures Procedures   Medications Ordered in ED Medications - No data to display  ED Course  I have reviewed the triage vital signs and the nursing notes.  Pertinent labs & imaging results that were available during my care of the patient were reviewed by me and considered in my medical decision making (see chart for details).  Clinical Course as of 07/04/20 1607  Tue Jul 03, 2020  1206 Potassium: 4.9 [AH]  1458 Patient not wanting to eat. I have  encouraged her to take in more food. - she is more short of breath.  [AH]    Clinical Course User Index [AH] Margarita Mail, PA-C   MDM Rules/Calculators/A&P                           Patient here with weakness. The differential diagnosis of weakness includes but is not limited to neurologic causes (GBS, myasthenia gravis, CVA, MS, ALS, transverse myelitis, spinal cord injury, CVA, botulism, ) and other causes: ACS, Arrhythmia, syncope, orthostatic hypotension, sepsis, hypoglycemia, electrolyte disturbance, hypothyroidism, respiratory failure, symptomatic anemia, dehydration, heat injury, polypharmacy, malignancy. During the course of her ED stay patient developed worsening dyspnea, likely multifactorial due to pulm edema and bronchitis/ bronchospasm. The patient has had difficulty keeping her blood sugars elevated and again this appears to be multifactorial due to ESRD and very poor oral intake. I ordered and reviewed labs cbcwith mild macrocytic anemia, cmo with elevated BUN/CR and potassium wnl. CXR shows vascular congestionand BL trace effusions. EKG with Prolonged QT. Patient given TIGAN IM for nausea. Patient will be admitted by resident service, Nephrology will dialyze. Final Clinical Impression(s) / ED Diagnoses Final diagnoses:  None    Rx / DC Orders ED Discharge Orders    None       Margarita Mail, PA-C 07/04/20 1613    Blanchie Dessert, MD 07/08/20 2051

## 2020-07-03 NOTE — Progress Notes (Incomplete)
Received report from ED RN.

## 2020-07-04 ENCOUNTER — Other Ambulatory Visit: Payer: Self-pay

## 2020-07-04 ENCOUNTER — Encounter (HOSPITAL_COMMUNITY): Payer: Self-pay | Admitting: Family Medicine

## 2020-07-04 ENCOUNTER — Observation Stay (HOSPITAL_COMMUNITY): Payer: Medicare PPO

## 2020-07-04 DIAGNOSIS — M069 Rheumatoid arthritis, unspecified: Secondary | ICD-10-CM | POA: Diagnosis present

## 2020-07-04 DIAGNOSIS — E2740 Unspecified adrenocortical insufficiency: Secondary | ICD-10-CM | POA: Diagnosis present

## 2020-07-04 DIAGNOSIS — F5104 Psychophysiologic insomnia: Secondary | ICD-10-CM | POA: Diagnosis present

## 2020-07-04 DIAGNOSIS — I150 Renovascular hypertension: Secondary | ICD-10-CM | POA: Diagnosis present

## 2020-07-04 DIAGNOSIS — R011 Cardiac murmur, unspecified: Secondary | ICD-10-CM | POA: Diagnosis not present

## 2020-07-04 DIAGNOSIS — K219 Gastro-esophageal reflux disease without esophagitis: Secondary | ICD-10-CM | POA: Diagnosis present

## 2020-07-04 DIAGNOSIS — N186 End stage renal disease: Secondary | ICD-10-CM | POA: Diagnosis present

## 2020-07-04 DIAGNOSIS — F17210 Nicotine dependence, cigarettes, uncomplicated: Secondary | ICD-10-CM | POA: Diagnosis present

## 2020-07-04 DIAGNOSIS — Z992 Dependence on renal dialysis: Secondary | ICD-10-CM | POA: Diagnosis not present

## 2020-07-04 DIAGNOSIS — E162 Hypoglycemia, unspecified: Secondary | ICD-10-CM

## 2020-07-04 DIAGNOSIS — I080 Rheumatic disorders of both mitral and aortic valves: Secondary | ICD-10-CM

## 2020-07-04 DIAGNOSIS — Z20822 Contact with and (suspected) exposure to covid-19: Secondary | ICD-10-CM | POA: Diagnosis present

## 2020-07-04 DIAGNOSIS — R0600 Dyspnea, unspecified: Secondary | ICD-10-CM | POA: Diagnosis present

## 2020-07-04 DIAGNOSIS — I2510 Atherosclerotic heart disease of native coronary artery without angina pectoris: Secondary | ICD-10-CM | POA: Diagnosis present

## 2020-07-04 DIAGNOSIS — E1122 Type 2 diabetes mellitus with diabetic chronic kidney disease: Secondary | ICD-10-CM | POA: Diagnosis present

## 2020-07-04 DIAGNOSIS — J449 Chronic obstructive pulmonary disease, unspecified: Secondary | ICD-10-CM | POA: Diagnosis present

## 2020-07-04 DIAGNOSIS — M898X9 Other specified disorders of bone, unspecified site: Secondary | ICD-10-CM | POA: Diagnosis present

## 2020-07-04 DIAGNOSIS — L4050 Arthropathic psoriasis, unspecified: Secondary | ICD-10-CM | POA: Diagnosis present

## 2020-07-04 DIAGNOSIS — E785 Hyperlipidemia, unspecified: Secondary | ICD-10-CM | POA: Diagnosis present

## 2020-07-04 DIAGNOSIS — E669 Obesity, unspecified: Secondary | ICD-10-CM | POA: Diagnosis present

## 2020-07-04 DIAGNOSIS — E11649 Type 2 diabetes mellitus with hypoglycemia without coma: Secondary | ICD-10-CM | POA: Diagnosis present

## 2020-07-04 DIAGNOSIS — I10 Essential (primary) hypertension: Secondary | ICD-10-CM | POA: Diagnosis not present

## 2020-07-04 DIAGNOSIS — F419 Anxiety disorder, unspecified: Secondary | ICD-10-CM | POA: Diagnosis present

## 2020-07-04 DIAGNOSIS — J111 Influenza due to unidentified influenza virus with other respiratory manifestations: Secondary | ICD-10-CM | POA: Diagnosis present

## 2020-07-04 DIAGNOSIS — G4733 Obstructive sleep apnea (adult) (pediatric): Secondary | ICD-10-CM | POA: Diagnosis present

## 2020-07-04 DIAGNOSIS — D631 Anemia in chronic kidney disease: Secondary | ICD-10-CM | POA: Diagnosis present

## 2020-07-04 DIAGNOSIS — E876 Hypokalemia: Secondary | ICD-10-CM | POA: Diagnosis not present

## 2020-07-04 DIAGNOSIS — M542 Cervicalgia: Secondary | ICD-10-CM | POA: Diagnosis present

## 2020-07-04 DIAGNOSIS — K5900 Constipation, unspecified: Secondary | ICD-10-CM | POA: Diagnosis not present

## 2020-07-04 LAB — GLUCOSE, CAPILLARY
Glucose-Capillary: 110 mg/dL — ABNORMAL HIGH (ref 70–99)
Glucose-Capillary: 82 mg/dL (ref 70–99)
Glucose-Capillary: 76 mg/dL (ref 70–99)
Glucose-Capillary: 81 mg/dL (ref 70–99)
Glucose-Capillary: 107 mg/dL — ABNORMAL HIGH (ref 70–99)
Glucose-Capillary: 95 mg/dL (ref 70–99)
Glucose-Capillary: 84 mg/dL (ref 70–99)

## 2020-07-04 LAB — RENAL FUNCTION PANEL
Albumin: 2.4 g/dL — ABNORMAL LOW (ref 3.5–5.0)
Anion gap: 8 (ref 5–15)
BUN: 7 mg/dL (ref 6–20)
CO2: 30 mmol/L (ref 22–32)
Calcium: 8.7 mg/dL — ABNORMAL LOW (ref 8.9–10.3)
Chloride: 96 mmol/L — ABNORMAL LOW (ref 98–111)
Creatinine, Ser: 6.01 mg/dL — ABNORMAL HIGH (ref 0.44–1.00)
GFR, Estimated: 7 mL/min — ABNORMAL LOW (ref >60)
Glucose, Bld: 86 mg/dL (ref 70–99)
Phosphorus: 2.7 mg/dL (ref 2.5–4.6)
Potassium: 3.8 mmol/L (ref 3.5–5.1)
Sodium: 134 mmol/L — ABNORMAL LOW (ref 135–145)

## 2020-07-04 LAB — CBC
HCT: 32.3 % — ABNORMAL LOW (ref 36.0–46.0)
Hemoglobin: 10.4 g/dL — ABNORMAL LOW (ref 12.0–15.0)
MCH: 31.5 pg (ref 26.0–34.0)
MCHC: 32.2 g/dL (ref 30.0–36.0)
MCV: 97.9 fL (ref 80.0–100.0)
Platelets: 194 10*3/uL (ref 150–400)
RBC: 3.3 MIL/uL — ABNORMAL LOW (ref 3.87–5.11)
RDW: 15.3 % (ref 11.5–15.5)
WBC: 2.7 10*3/uL — ABNORMAL LOW (ref 4.0–10.5)
nRBC: 0 % (ref 0.0–0.2)

## 2020-07-04 LAB — ECHOCARDIOGRAM COMPLETE
AR max vel: 1.26 cm2
AV Area VTI: 1.34 cm2
AV Area mean vel: 1.25 cm2
AV Mean grad: 18 mmHg
AV Peak grad: 33.2 mmHg
Ao pk vel: 2.88 m/s
Area-P 1/2: 2.23 cm2
MV VTI: 1.52 cm2
S' Lateral: 3.1 cm

## 2020-07-04 LAB — T4, FREE: Free T4: 0.71 ng/dL (ref 0.61–1.12)

## 2020-07-04 LAB — HEPATITIS B SURFACE ANTIBODY, QUANTITATIVE: Hep B S AB Quant (Post): 55.3 m[IU]/mL (ref >9.9)

## 2020-07-04 LAB — MRSA PCR SCREENING: MRSA by PCR: NEGATIVE

## 2020-07-04 LAB — TSH: TSH: 4.375 u[IU]/mL (ref 0.350–4.500)

## 2020-07-04 LAB — CORTISOL: Cortisol, Plasma: 4.2 ug/dL

## 2020-07-04 MED ORDER — LABETALOL HCL 200 MG PO TABS
400.0000 mg | ORAL_TABLET | Freq: Three times a day (TID) | ORAL | Status: DC
  Administered 2020-07-04 (×2): 400 mg via ORAL
  Filled 2020-07-04 (×3): qty 2

## 2020-07-04 MED ORDER — DARBEPOETIN ALFA 150 MCG/0.3ML IJ SOSY
PREFILLED_SYRINGE | INTRAMUSCULAR | Status: AC
  Filled 2020-07-04: qty 0.3

## 2020-07-04 MED ORDER — RENA-VITE PO TABS
1.0000 | ORAL_TABLET | Freq: Every day | ORAL | Status: DC
  Administered 2020-07-04 – 2020-07-05 (×2): 1 via ORAL
  Filled 2020-07-04 (×3): qty 1

## 2020-07-04 MED ORDER — LORATADINE 10 MG PO TABS
10.0000 mg | ORAL_TABLET | Freq: Every day | ORAL | Status: DC | PRN
Start: 2020-07-04 — End: 2020-07-06

## 2020-07-04 MED ORDER — HYDRALAZINE HCL 50 MG PO TABS
50.0000 mg | ORAL_TABLET | Freq: Two times a day (BID) | ORAL | Status: DC
  Administered 2020-07-04: 50 mg via ORAL
  Filled 2020-07-04: qty 1

## 2020-07-04 MED ORDER — ZOLPIDEM TARTRATE 5 MG PO TABS
5.0000 mg | ORAL_TABLET | Freq: Every day | ORAL | Status: DC
  Administered 2020-07-04 – 2020-07-05 (×2): 5 mg via ORAL
  Filled 2020-07-04 (×2): qty 1

## 2020-07-04 MED ORDER — GABAPENTIN 300 MG PO CAPS
300.0000 mg | ORAL_CAPSULE | Freq: Every evening | ORAL | Status: DC | PRN
  Filled 2020-07-04: qty 1

## 2020-07-04 MED ORDER — METHOCARBAMOL 500 MG PO TABS
500.0000 mg | ORAL_TABLET | Freq: Three times a day (TID) | ORAL | Status: DC | PRN
Start: 2020-07-04 — End: 2020-07-06
  Administered 2020-07-04 – 2020-07-06 (×2): 500 mg via ORAL
  Filled 2020-07-04 (×2): qty 1

## 2020-07-04 MED ORDER — SENNA 8.6 MG PO TABS
1.0000 | ORAL_TABLET | Freq: Every day | ORAL | Status: DC | PRN
Start: 2020-07-04 — End: 2020-07-06

## 2020-07-04 MED ORDER — PREDNISONE 5 MG PO TABS: 5.0000 mg | ORAL_TABLET | Freq: Every day | ORAL | Status: DC

## 2020-07-04 MED ORDER — SORBITOL 70 % SOLN: 15.0000 mL | Freq: Every day | Status: DC | PRN

## 2020-07-04 MED ORDER — OXYCODONE HCL 5 MG PO TABS
5.0000 mg | ORAL_TABLET | Freq: Four times a day (QID) | ORAL | Status: DC | PRN
Start: 2020-07-04 — End: 2020-07-06
  Administered 2020-07-04 – 2020-07-06 (×3): 5 mg via ORAL
  Filled 2020-07-04 (×3): qty 1

## 2020-07-04 MED ORDER — FERROUS SULFATE 325 (65 FE) MG PO TABS
325.0000 mg | ORAL_TABLET | ORAL | Status: DC
  Administered 2020-07-04: 325 mg via ORAL
  Filled 2020-07-04: qty 1

## 2020-07-04 MED ORDER — POLYVINYL ALCOHOL 1.4 % OP SOLN
1.0000 [drp] | Freq: Three times a day (TID) | OPHTHALMIC | Status: DC
  Administered 2020-07-04 – 2020-07-06 (×7): 1 [drp] via OPHTHALMIC
  Filled 2020-07-04: qty 15

## 2020-07-04 MED ORDER — CHLORHEXIDINE GLUCONATE CLOTH 2 % EX PADS: 6.0000 | MEDICATED_PAD | Freq: Every day | CUTANEOUS | Status: DC

## 2020-07-04 NOTE — Plan of Care (Signed)
  Problem: Clinical Measurements: Goal: Ability to maintain clinical measurements within normal limits will improve Outcome: Progressing   

## 2020-07-04 NOTE — Progress Notes (Signed)
Initial Nutrition Assessment  DOCUMENTATION CODES:  Not applicable  INTERVENTION:  Add Magic cup TID with meals, each supplement provides 290 kcal and 9 grams of protein.  Add Rena-Vite daily.  NUTRITION DIAGNOSIS:  Inadequate oral intake related to acute illness as evidenced by per patient/family report.  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  Spoke with pt at bedside. Pt reports that she has had little to no appetite in the last 2-3 weeks. She reports trying to eat despite no appetite, but it was difficult. She reports eating 3 meals per day on non-dialysis days and 2 meals and a snack on non-dialysis days. In H&P, pt reports having the flu and not feeling well recently.  She reports her dry weight is 164 lbs and she recently weighed herself and her scale said 149 lbs. This is a 15 (9%) weight loss, which is significant and severe. New EDW is 72.5 kg.  On exam, pt did not have any significant depletions.  Recommend adding Magic Cup TID to promote intake and Rena-Vite daily.  Medications: reviewed; ferrous sulfate, Lasix  Labs: reviewed; Na 134, CBG 76-110 HbA1c: 4.9% (05/2019)  NUTRITION - FOCUSED PHYSICAL EXAM: Flowsheet Row Most Recent Value  Orbital Region Mild depletion  Upper Arm Region No depletion  Thoracic and Lumbar Region No depletion  Buccal Region Mild  depletion  Temple Region No depletion  Clavicle Bone Region No depletion  Clavicle and Acromion Bone Region No depletion  Scapular Bone Region No depletion  Dorsal Hand No depletion  Patellar Region No depletion  Anterior Thigh Region No depletion  Posterior Calf Region No depletion  Edema (RD Assessment) None  Hair Reviewed  Eyes Reviewed  Mouth Reviewed  Skin Reviewed  Nails Reviewed     Diet Order:   Diet Order            Diet renal with fluid restriction Fluid restriction: 1200 mL Fluid; Room service appropriate? Yes; Fluid consistency: Thin  Diet effective now                EDUCATION NEEDS:  Education needs have been addressed  Skin:  Skin Assessment: Reviewed RN Assessment  Last BM:  07/04/20 - Type 7, large amount  Height:  Ht Readings from Last 1 Encounters:  06/28/20 '5\' 2"'$  (1.575 m)   Weight:  Wt Readings from Last 1 Encounters:  07/04/20 70 kg   Ideal Body Weight:  50 kg  BMI:  Body mass index is 28.23 kg/m.  Estimated Nutritional Needs:  Kcal:  1850-2050 Protein:  85-100 grams Fluid:  >1.85 L  Derrel Nip, RD, LDN Registered Dietitian After Hours/Weekend Pager # in [place]

## 2020-07-04 NOTE — Evaluation (Signed)
Physical Therapy Evaluation Patient Details Name: Jodi Bell MRN: [id number] DOB: Mar 27, 1960 Today's Date: 07/04/2020   History of Present Illness  60 year old female presents via EMS 5/24 c/o weakness and light headed after missing dialysis day prior.  Blood sugar in 40s Admitted for observation. 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  Clinical Impression  PTA pt living with daughter in multistory home with bed and bath on main level. Pt reports independence with use of SPC/RW for mobility. Pt is limited in safe mobility by decreased strength and endurance. Pt is independent for bed mobility, mod I for transfers and supervision for ambulation with RW. PT recommending HHPT at discharge to regain strength and endurance. PT will continue to follow acutely.     Follow Up Recommendations Home health PT;Supervision - Intermittent    Equipment Recommendations  Other (comment) (Rollator)       Precautions / Restrictions Precautions Precautions: None Restrictions Weight Bearing Restrictions: No      Mobility  Bed Mobility Overal bed mobility: Independent                  Transfers Overall transfer level: Modified independent Equipment used: Rolling walker (2 wheeled)             General transfer comment: good power up and self steadying at [employer]  Ambulation/Gait Ambulation/Gait assistance: Supervision Gait Distance (Feet): 20 Feet Assistive device: Rolling walker (2 wheeled) Gait Pattern/deviations: Step-through pattern;Decreased step length - right;Decreased step length - left Gait velocity: slowed Gait velocity interpretation: <1.8 ft/sec, indicate of risk for recurrent falls General Gait Details: supervision for slow, steady gait with RW, vc for proximity to RW        Balance Overall balance assessment: Mild deficits observed, not formally tested                                           Pertinent  Vitals/Pain Pain Assessment: Faces Faces Pain Scale: Hurts a little bit Pain Location: R 5th finger Pain Descriptors / Indicators: Aching Pain Intervention(s): Monitored during session    Home Living Family/patient expects to be discharged to:: Private residence Living Arrangements: Children Available Help at Discharge: Family Type of Home: House Home Access: Level entry     Home Layout: Multi-level;Able to live on main level with bedroom/bathroom Home Equipment: Gilford Rile - 2 wheels;Toilet riser;Tub bench;Cane - single point      Prior Function Level of Independence: Independent with assistive device(s)         Comments: uses SPC     Hand Dominance   Dominant Hand: Right    Extremity/Trunk Assessment   Upper Extremity Assessment Upper Extremity Assessment: Defer to OT evaluation    Lower Extremity Assessment Lower Extremity Assessment: Generalized weakness       Communication   Communication: No difficulties  Cognition Arousal/Alertness: Awake/alert Behavior During Therapy: WFL for tasks assessed/performed Overall Cognitive Status: Within Functional Limits for tasks assessed                                        General Comments General comments (skin integrity, edema, etc.): VSS on RA        Assessment/Plan    PT Assessment Patient needs continued PT services  PT  Problem List Decreased strength;Decreased activity tolerance;Decreased mobility       PT Treatment Interventions DME instruction;Gait training;Functional mobility training;Therapeutic activities;Therapeutic exercise;Balance training;Cognitive remediation;Patient/family education    PT Goals (Current goals can be found in the Care Plan section)  Acute Rehab PT Goals Patient Stated Goal: get strength back PT Goal Formulation: With patient Time For Goal Achievement: 07/18/20 Potential to Achieve Goals: Good    Frequency Min 3X/week    AM-PAC PT "6 Clicks" Mobility   Outcome Measure Help needed turning from your back to your side while in a flat bed without using bedrails?: None Help needed moving from lying on your back to sitting on the side of a flat bed without using bedrails?: None Help needed moving to and from a bed to a chair (including a wheelchair)?: None Help needed standing up from a chair using your arms (e.g., wheelchair or bedside chair)?: None Help needed to walk in hospital room?: None Help needed climbing 3-5 steps with a railing? : A Little 6 Click Score: 23    End of Session Equipment Utilized During Treatment: Gait belt Activity Tolerance: Patient tolerated treatment well Patient left: in chair;with call bell/phone within reach;with chair alarm set Nurse Communication: Mobility status PT Visit Diagnosis: Muscle weakness (generalized) (M62.81)    Time: [id number] PT Time Calculation (min) (ACUTE ONLY): 20 min   Charges:   PT Evaluation $PT Eval Moderate Complexity: 1 Mod          Shawna Kiener B. Migdalia Dk PT, DPT Acute Rehabilitation Services Pager [phone] Office [phone]   [place] 07/04/2020, 9:41 AM

## 2020-07-04 NOTE — Evaluation (Signed)
Occupational Therapy Evaluation Patient Details Name: Jodi Bell MRN: [id number] DOB: 07-Apr-1960 Today's Date: 07/04/2020    History of Present Illness 60 year old female presents via EMS 5/24 c/o weakness and light headed after missing dialysis day prior.  Blood sugar in 40s Admitted for observation. 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   Clinical Impression   Pt is functioning at a set up to supervision level in ADL and ADL transfers due to generalized weakness. Recommending ADL with nursing staff and returning home with her supportive family. No further OT needs.     Follow Up Recommendations  No OT follow up    Equipment Recommendations  None recommended by OT    Recommendations for Other Services       Precautions / Restrictions Precautions Precautions: None Restrictions Weight Bearing Restrictions: No      Mobility Bed Mobility              General bed mobility comments: pt in chair    Transfers Overall transfer level: Modified independent Equipment used: Rolling walker (2 wheeled)             General transfer comment: no physical assist or difficulty    Balance Overall balance assessment: Mild deficits observed, not formally tested                                         ADL either performed or assessed with clinical judgement   ADL                                         General ADL Comments: Pt is functioning over all at a set up to supervision level. Pt reports a sedentary lifestyle.     Vision Patient Visual Report: No change from baseline       Perception     Praxis      Pertinent Vitals/Pain Pain Assessment: No/denies pain Faces Pain Scale: Hurts a little bit Pain Location: R 5th finger Pain Descriptors / Indicators: Aching Pain Intervention(s): Monitored during session     Hand Dominance Right   Extremity/Trunk Assessment Upper Extremity  Assessment Upper Extremity Assessment: Generalized weakness   Lower Extremity Assessment Lower Extremity Assessment: Generalized weakness       Communication Communication Communication: No difficulties   Cognition Arousal/Alertness: Awake/alert Behavior During Therapy: WFL for tasks assessed/performed Overall Cognitive Status: Within Functional Limits for tasks assessed                                 General Comments: reports her processing is slower than usual   General Comments  VSS on RA    Exercises     Shoulder Instructions      Home Living Family/patient expects to be discharged to:: Private residence Living Arrangements: Children Available Help at Discharge: Family Type of Home: House Home Access: Level entry     Home Layout: Multi-level;Able to live on main level with bedroom/bathroom     Bathroom Shower/Tub: Teacher, early years/pre: Standard Bathroom Accessibility: Yes   Home Equipment: Walker - 2 wheels;Toilet riser;Tub bench;Cane - single point          Prior Functioning/Environment Level  of Independence: Independent with assistive device(s)        Comments: uses [facility]        OT Problem List:        OT Treatment/Interventions:      OT Goals(Current goals can be found in the care plan section) Acute Rehab OT Goals Patient Stated Goal: get strength back  OT Frequency:     Barriers to D/C:            Co-evaluation              AM-PAC OT "6 Clicks" Daily Activity     Outcome Measure Help from another person eating meals?: None Help from another person taking care of personal grooming?: None Help from another person toileting, which includes using toliet, bedpan, or urinal?: None Help from another person bathing (including washing, rinsing, drying)?: None Help from another person to put on and taking off regular upper body clothing?: None Help from another person to put on and taking off regular lower  body clothing?: None 6 Click Score: 24   End of Session Equipment Utilized During Treatment: Rolling walker;Gait belt  Activity Tolerance: Patient tolerated treatment well Patient left: in chair;with call bell/phone within reach;with chair alarm set  OT Visit Diagnosis: Muscle weakness (generalized) (M62.81)                Time: [id number] OT Time Calculation (min): 15 min Charges:  OT General Charges $OT Visit: 1 Visit OT Evaluation $OT Eval Low Complexity: 1 Low  Nestor Lewandowsky, OTR/L Acute Rehabilitation Services Pager: [phone] Office: [phone]  Malka So 07/04/2020, 10:37 AM

## 2020-07-04 NOTE — Consult Note (Addendum)
Cardiology Consultation:   Patient ID: Jodi Bell MRN: [id number]; DOB: 1960-09-19  Admit date: 07/03/2020 Date of Consult: 07/04/2020  PCP:  [name]., MD   [facility] Providers Cardiologist:  None   - new to [facility] Click here to update MD or APP on Care Team, Refresh:1}     Patient Profile:   Jodi Bell is a 60 y.o. 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 Demaris Callander.  History of Present Illness:   Jodi Bell was seen at [facility] several months ago in anticipation of renal transplant. Their note indicates she previously had issues with baseline BPs 180s but was also having intermittent issues with hypotension at dialysis. Pre-transplant testing was arranged to include 2D echo 04/2020 showing normal LV function, mild LVH, trivial AI/MR/PR, mild-moderate AS, mild TR, trivial pericardial effusion. Abdominal CT 04/2020 showed no adrenal gland tumors but did demonstrate mild-moderate atherosclerotic calcification and tortuosity of the external iliac arteries, severe coronary artery calcifications. Cardiac PET MPI same day demonstrated normal LV function, no evidence of ischemia or infarct, abnormal myocardial blood flow reserve suggestive of balanced ischemia and/or microvascular disease. She does not recall being told any additional workup was necessary but states she honestly doesn't remember even what tests she had. Additional CT imaging 04/24/20 showed numerous bilateral  renal lesions likely representing cysts but too small to characterize with absolute certainty.  She was sick last week with coughing, nausea, vomiting and low grade fever. She reportedly was diagnosed with influenza last Thursday. She states this was diagnosed when she was in the ER. Reviewed note from 5/19 and do not see flu or Covid testing at that time. She was also having some atypical chest pain. D-dimer was elevated but VQ negative for PE. Symptoms were felt possibly due to missed HD (had missed 5/18). She went to her session on Friday 5/20. However, over the weekend she continued feel weak, especially in her legs so missed her HD on Monday 5/23 as well. Per nephrology notes she typically otherwise usually attends well without missed dialysis. She presented back to the ED 07/03/2020 with continued weakness in her legs and poor appetite. She reports mild SOB and nausea. She had not been using her inhaler recently. She has rare transient focal chest pains that periodically come on that spontaneously resolve within minutes (occurred 1x over the weekend then briefly last night). It is not worse with exertion or inspiration or specifically associated with any other symptoms. She does notice the episodes increase in frequency if she misses her Imdur. 2D Echo today showed EF 60-65%, grade 2 DD, normal RV, mildly elevated PASP, severely dilated LA, mild-moderate mitral stenosis, mild aortic stenosis, dilated IVC. Troponins were negative. EKGs show NSR no acute changes. Some question of prolonged QT noted in chart, last assessed 41m by hand calculation.  Cardiology is consulted for persistent hypertension in the 180s. She has not yet gotten her amlodipine, Imdur, and irbesartan from this morning. She has not yet gone for make-up dialysis yet to get back on track. It is  planned for this afternoon. She denies any acute complaints this afternoon. She reports as an outpatient her BP had been much better controlled  after they got her on her current regimen. She is currently ordered for amlodipine '10mg'$  daily, clonidine 0.3.mg TID, Lasix '40mg'$  BID, hydralazine '50mg'$  BID, labetalol '400mg'$  TID, Imdur '60mg'$  daily, irbesartan '300mg'$  daily (formulary substituted for home olmesartan). Her home MAR has hydralazine '100mg'$  BID listed instead of '50mg'$  BID. At the end of the encounter she recalls she saw Dr. Einar Gip once many years ago for unclear reasons, benign workup per her report, and is content with [facility] taking over her care.   Past Medical History:  Diagnosis Date  . Anemia of chronic disease   . Anxiety   . Aortic stenosis   . Arthritis    oa and psoriatic ra  . Asthma   . Back pain, chronic    lower back  . Candida infection finished tx 2 days ago  . Chronic insomnia   . COPD (chronic obstructive pulmonary disease) ([place])   . Coronary artery calcification seen on CT scan   . Diabetes mellitus    type 2  . Eczema   . ESRD on dialysis ([place])   . Essential hypertension   . GERD (gastroesophageal reflux disease)   . Gout   . History of hiatal hernia   . Hypoalbuminemia   . Hyponatremia   . Mitral stenosis   . Obesity   . PAD (peripheral artery disease) ([facility])    a. externial iliac calcification seen on CT 04/2020.  Marland Kitchen Pneumonia few yrs ago x 2  . Sleep apnea   . Tobacco abuse   . Upper GI bleed 05/2019    Past Surgical History:  Procedure Laterality Date  . BACK SURGERY  2016   lower back fusion with cage  . BIOPSY  09/23/2017   Procedure: BIOPSY;  Surgeon: Wonda Horner, MD;  Location: [facility];  Service: Endoscopy;;  . BIOPSY  05/19/2019   Procedure: BIOPSY;  Surgeon: Otis Brace, MD;  Location: [place];  Service: Gastroenterology;;  . CESAREAN SECTION  1990   x 1   . COLONOSCOPY WITH PROPOFOL N/A 09/23/2017   Procedure: COLONOSCOPY WITH PROPOFOL Hemostatic clips placed;  Surgeon: Wonda Horner, MD;  Location: [facility];  Service: Endoscopy;  Laterality: N/A;  . DILATION AND  CURETTAGE OF UTERUS  1988  . ESOPHAGOGASTRODUODENOSCOPY (EGD) WITH PROPOFOL N/A 09/23/2017   Procedure: ESOPHAGOGASTRODUODENOSCOPY (EGD) WITH PROPOFOL;  Surgeon: Wonda Horner, MD;  Location: [facility];  Service: Endoscopy;  Laterality: N/A;  . ESOPHAGOGASTRODUODENOSCOPY (EGD) WITH PROPOFOL N/A 05/19/2019   Procedure: ESOPHAGOGASTRODUODENOSCOPY (EGD) WITH PROPOFOL;  Surgeon: Otis Brace, MD;  Location: [facility];  Service: Gastroenterology;  Laterality: N/A;  . GIVENS CAPSULE STUDY N/A 05/19/2019   Procedure: GIVENS CAPSULE STUDY;  Surgeon: Otis Brace, MD;  Location: [place];  Service: Gastroenterology;  Laterality: N/A;  . HOT HEMOSTASIS N/A 05/19/2019   Procedure: HOT HEMOSTASIS (ARGON PLASMA COAGULATION/BICAP);  Surgeon: Otis Brace, MD;  Location: [facility];  Service: Gastroenterology;  Laterality: N/A;  . PARTIAL KNEE ARTHROPLASTY Left 11/12/2017   Procedure: left unicompartmental arthroplasty-medial;  Surgeon: Paralee Cancel, MD;  Location: [facility];  Service: Orthopedics;  Laterality: Left;  45mn  . SUBMUCOSAL INJECTION  09/23/2017   Procedure: SUBMUCOSAL INJECTION;  Surgeon: GWonda Horner MD;  Location: [facility];  Service: Endoscopy;;  in colon  . TUBAL LIGATION  1990     Home Medications:  Prior to  Admission medications   Medication Sig Start Date End Date Taking? Authorizing Provider  acetaminophen (TYLENOL) 500 MG tablet Take 1,000 mg by mouth every 6 (six) hours as needed for mild pain.   Yes [provider]  albuterol (PROVENTIL) (2.5 MG/3ML) 0.083% nebulizer solution Take 2.5 mg by nebulization as needed for wheezing or shortness of breath. 04/15/19  Yes [provider]  allopurinol (ZYLOPRIM) 100 MG tablet Take 100 mg by mouth every evening.   Yes [provider]  amLODipine (NORVASC) 10 MG tablet Take 10 mg by mouth daily.   Yes [provider]  budesonide-formoterol (SYMBICORT) 160-4.5 MCG/ACT inhaler Inhale 2  puffs into the lungs 2 (two) times daily.   Yes [provider]  calcium acetate (PHOSLO) 667 MG capsule Take 3 capsules by mouth in the morning and at bedtime. Take 2 capsules with snacks. 10/25/18  Yes [provider]  carboxymethylcellulose 1 % ophthalmic solution Place 1 drop into both eyes 4 (four) times daily.   Yes [provider]  cetirizine (ZYRTEC) 10 MG tablet Take 10 mg by mouth 2 (two) times daily as needed (hives).  06/13/17  Yes Shelda Pal, DO  Cholecalciferol (VITAMIN D3) 10 MCG (400 UNIT) tablet Take 400 Units by mouth every evening.   Yes [provider]  cinacalcet (SENSIPAR) 30 MG tablet Take 30 mg by mouth 3 (three) times a week. Tuesday, Thursday, Saturday.   Yes [provider]  cloNIDine (CATAPRES) 0.3 MG tablet Take 1 tablet (0.3 mg total) by mouth 3 (three) times daily. 05/21/19  Yes Hosie Poisson, MD  cyclobenzaprine (FLEXERIL) 10 MG tablet Take 10 mg by mouth 3 (three) times daily as needed for muscle spasms. 06/08/20  Yes [provider]  ELDERBERRY PO Take 2 drops by mouth daily.   Yes [provider]  EPINEPHrine (EPIPEN) 0.3 mg/0.3 mL SOAJ injection Inject 0.3 mLs (0.3 mg total) into the muscle once. Patient taking differently: Inject 0.3 mg into the muscle as needed (for allergic reaction). 09/30/12  Yes Le, Thao P, DO  ferrous sulfate 325 (65 FE) MG tablet Take 325 mg by mouth 3 (three) times a week. TUE THUR SAT   Yes [provider]  fluticasone (FLONASE) 50 MCG/ACT nasal spray Place 1 spray into both nostrils daily.   Yes [provider]  folic acid (FOLVITE) [id number] MCG tablet Take 400 mcg by mouth daily.    Yes [provider]  furosemide (LASIX) 40 MG tablet Take 40 mg by mouth 2 (two) times daily.    Yes [provider]  gabapentin (NEURONTIN) 300 MG capsule Take 1 capsule (300 mg total) by mouth at bedtime as needed (Neuropathic pain). Patient taking  differently: Take 300 mg by mouth as needed (pain). 12/14/17 05/18/19 Yes Dana Allan I, MD  hydrALAZINE (APRESOLINE) 100 MG tablet Take 100 mg by mouth 2 (two) times daily. 05/07/20  Yes [provider]  isosorbide mononitrate (IMDUR) 60 MG 24 hr tablet Take 1 tablet (60 mg total) by mouth daily. 03/02/18  Yes Thurnell Lose, MD  JANUVIA 25 MG tablet Take 25 mg by mouth daily.  08/09/15  Yes [provider]  labetalol (NORMODYNE) 200 MG tablet Take 400 mg by mouth 3 (three) times daily.   Yes [provider]  LORazepam (ATIVAN) 1 MG tablet Take 1 mg by mouth every 8 (eight) hours as needed for anxiety. 06/25/20  Yes [provider]  montelukast (SINGULAIR) 10 MG tablet Take 10  mg by mouth daily with supper.    Yes [provider]  multivitamin (RENA-VIT) TABS tablet Take 1 tablet by mouth daily with supper.   Yes [provider]  olmesartan (BENICAR) 40 MG tablet Take 40 mg by mouth every evening. 05/06/19  Yes [provider]  oxyCODONE (ROXICODONE) 15 MG immediate release tablet Take 15 mg by mouth 3 (three) times daily as needed for pain. 05/31/20  Yes [provider]  pantoprazole (PROTONIX) 40 MG tablet Take 40 mg by mouth daily. 06/09/20  Yes [provider]  predniSONE (DELTASONE) 5 MG tablet Take 5 mg by mouth daily with breakfast.    Yes [provider]  Probiotic Product (PROBIOTIC PO) Take 1 capsule by mouth daily.   Yes [provider]  Tiotropium Bromide Monohydrate (SPIRIVA RESPIMAT IN) Inhale 2 puffs into the lungs daily.   Yes [provider]  VENTOLIN HFA 108 (90 Base) MCG/ACT inhaler Inhale 1-2 puffs into the lungs every 6 (six) hours as needed for shortness of breath. wheezing 06/13/15  Yes [provider]  XELJANZ 5 MG TABS Take 5 mg by mouth at bedtime. 05/17/19  Yes [provider]  zolpidem (AMBIEN) 5 MG tablet Take 5 mg by mouth at bedtime. 05/05/19  Yes  [provider]  esomeprazole (NEXIUM) 20 MG capsule Take 2 capsules (40 mg total) by mouth daily. Patient not taking: Reported on 07/04/2020 05/21/19   Hosie Poisson, MD    Inpatient Medications: Scheduled Meds: . amLODipine  10 mg Oral Daily  . Chlorhexidine Gluconate Cloth  6 each Topical Q0600  . cloNIDine  0.3 mg Oral TID  . ferrous sulfate  325 mg Oral Once per day on Mon Wed Fri  . furosemide  40 mg Oral BID  . heparin  5,000 Units Subcutaneous Q8H  . hydrALAZINE  50 mg Oral BID  . irbesartan  300 mg Oral Daily  . isosorbide mononitrate  60 mg Oral Daily  . labetalol  400 mg Oral TID  . paricalcitol  6 mcg Intravenous Once per day on Mon Wed Fri  . polyvinyl alcohol  1 drop Both Eyes TID  . zolpidem  5 mg Oral QHS   Continuous Infusions:  PRN Meds: acetaminophen, albuterol, guaiFENesin, loratadine, methocarbamol, senna  Allergies:    Allergies  Allergen Reactions  . 3-Methyl-2-Benzothiazolinone Hydrazone Other (See Comments)    Muscle weakness muscle cramping in legs Other reaction(s): Myalgias (Muscle Pain)  . Banana Anaphylaxis and Swelling    TONGUE AND MOUTH SWELLING  . Black Walnut Flavor Anaphylaxis    Walnuts  . Hazelnut ([place]) Allergy Skin Test Anaphylaxis    Hazelnuts  . Leflunomide And Related Other (See Comments)    Severe Headache Other reaction(s): Headache Severe Headache Severe Headache    . Lisinopril Swelling    Angioedema  Swelling of the face Angioedema  Swelling of the face Other reaction(s): Facial Swelling   . No Healthtouch Food Allergies Anaphylaxis    Spicy Mustard 4.7.2021 Pt reports that she eats Regular Yellow Mustard  . Other Other (See Comments), Anaphylaxis and Swelling    Cosentyx= angioedema  Other reaction(s): Facial Edema (intolerance) Mouth itches and tongue swelling Hazel nuts and pecans also Walnuts Walnuts Renal failure Other reaction(s): Other Serotonin Syndrome  Serotonin syndrome   Tremors Other reaction(s): Other Tremors Muscle weakness muscle cramping in legs Other reaction(s): Myalgias (Muscle Pain) Mouth itches and tongue swelling Hazel nuts and pecans also Walnuts   . Pecan Extract Allergy  Skin Test Anaphylaxis    Pecans Pecans Pecans   . Pecan Nut (Diagnostic) Anaphylaxis    Pecans  . Trazodone And Nefazodone Other (See Comments)    Serotonin Syndrome  Serotonin syndrome  Serotonin Syndrome  Serotonin syndrome  Tremors Other reaction(s): Other Tremors   . Adalimumab Other (See Comments)    Blisters on abdomen =humira Blisters on abdomen =humira Blisters on abdomen =humira  . Escitalopram Oxalate Other (See Comments)    Hand problems - Serotonin Syndrome  Hand problems - Serotonin Syndrome  Hand problems - Serotonin Syndrome  Hand problems   . Ezetimibe Other (See Comments)    myalgias cramps myalgias cramps   . Secukinumab Swelling    Cosentyx = angiodema Cosentyx = angiodema  . Statins Other (See Comments)    Leg pains and weakness Leg pains and weakness  . Ibuprofen Other (See Comments)    Renal Problems    Social History:   Social History   Socioeconomic History  . Marital status: Divorced    Spouse name: BASIL  . Number of children: 3  . Years of education: 1  . Highest education level: GED or equivalent  Occupational History  . Occupation: retired Corporate treasurer  Tobacco Use  . Smoking status: Current Every Day Smoker    Packs/day: 0.50    Years: 40.00    Pack years: 20.00    Types: Cigarettes  . Smokeless tobacco: Never Used  Vaping Use  . Vaping Use: Never used  Substance and Sexual Activity  . Alcohol use: Yes    Comment: occ  . Drug use: No  . Sexual activity: Not on file  Other Topics Concern  . Not on file  Social History Narrative  . Not on file   Social Determinants of Health   Financial Resource Strain: Not on file  Food Insecurity: Not on file  Transportation Needs: Not on file  Physical Activity:  Not on file  Stress: Not on file  Social Connections: Not on file  Intimate Partner Violence: Not on file    Family History:    Family History  Problem Relation Age of Onset  . Lung cancer Mother   . Diabetes Sister   . Heart disease Sister   . Asthma Daughter   . Arthritis Daughter   . Arthritis Daughter   . Thyroid cancer Other        1/2 sister  . Heart disease Other        1/2 sister     ROS:  Please see the history of present illness.  All other ROS reviewed and negative.     Physical Exam/Data:   Vitals:   07/03/20 2233 07/04/20 0130 07/04/20 0421 07/04/20 0945  BP: (!) 194/79 (!) 184/74 (!) 186/82 (!) 182/81  Pulse: 87 76 80 81  Resp: '18 17 17 19  '$ Temp: 99.4 F (37.4 C) 98.5 F (36.9 C) 98.7 F (37.1 C) 98.6 F (37 C)  TempSrc: Oral Oral Oral Oral  SpO2: 90% 92% 92% 93%    Intake/Output Summary (Last 24 hours) at 07/04/2020 1528 Last data filed at 07/04/2020 1258 Gross per 24 hour  Intake 500 ml  Output 3001 ml  Net -2501 ml   Last 3 Weights 06/28/2020 05/22/2020 12/15/2019  Weight (lbs) 167 lb 1.7 oz 167 lb 163 lb 2.3 oz  Weight (kg) 75.8 kg 75.751 kg 74 kg     There is no height or weight on file to calculate BMI.  General: Well developed,  well nourished AAF in no acute distress. Head: Normocephalic, atraumatic, sclera non-icteric, no xanthomas, nares are without discharge. Neck: Negative for carotid bruits. JVP not elevated. Lungs: Bilateral crackles noted without wheezes or rhonchi. Breathing is unlabored. Heart: RRR S1 S2 2/6 SEM across precordium without rubs or gallops.  Abdomen: Soft, non-tender, non-distended with normoactive bowel sounds. No rebound/guarding. Extremities: No clubbing or cyanosis. No edema. Distal pedal pulses are 2+ and equal bilaterally. LUE AVF Neuro: Alert and oriented X 3. Moves all extremities spontaneously. Psych:  Responds to questions appropriately with a normal affect. Son at bedside.  EKG:  The EKG was personally  reviewed and demonstrates:    Multiple tracings reviewed. Initial: NSR 81bpm, LAD, no acute STT changes, QTc 463m Most recent: NSR 75bpm, borderline LAD, no acute STT changes, QTc hand calculated at 4944m  Telemetry:  Telemetry was personally reviewed and demonstrates:  NSR  Relevant CV Studies:  [facility]2020 [facility] INTERPRET[facility] --------------------------------------------------------------- NORMAL LEFT VENTRICULAR SYSTOLIC FUNCTION WITH MILD LVH NORMAL RIGHT VENTRICULAR SYSTOLIC FUNCTION VALVULAR REGURGITATION: TRIVIAL AR, TRIVIAL MR, TRIVIAL PR, MILD TR VALVULAR STENOSIS: MILD AS TRIVIAL PERICARDIAL EFFUSION MILD TO MODERATE AORTIC STENOSIS. NO PRIOR STUDY FOR COMPARISON  2d Echo Here Today 1. Left ventricular ejection fraction, by estimation, is 60 to 65%. The  left ventricle has normal function. The left ventricle has no regional  wall motion abnormalities. There is mild concentric left ventricular  hypertrophy. Left ventricular diastolic  parameters are consistent with Grade II diastolic dysfunction  (pseudonormalization). Elevated left atrial pressure.  2. Right ventricular systolic function is normal. The right ventricular  size is normal. There is mildly elevated pulmonary artery systolic  pressure. The estimated right ventricular systolic pressure is 370000000mHg.  3. Left atrial size was severely dilated.  4. The mitral valve is degenerative. Mild to moderate mitral valve  regurgitation. Mild to moderate mitral stenosis. The mean mitral valve  gradient is 8.0 mmHg. Moderate to severe mitral annular calcification.  5. The aortic valve is tricuspid. There is mild calcification of the  aortic valve. There is moderate thickening of the aortic valve. Aortic  valve regurgitation is not visualized. Mild aortic valve stenosis. Aortic  valve mean gradient measures 18.0 mmHg.  Aortic valve Vmax measures 2.88 m/s.  6. The inferior vena cava is  dilated in size with >50% respiratory  variability, suggesting right atrial pressure of 8 mmHg.   Comparison(s): Prior images unable to be directly viewed, comparison made  by report only.   PET MPI 04/2020  FINAL COMMENTS   Cardiac PET/CT Myocardial Perfusion Study:  1- Cardiac PET/CT Myocardial Perfusion Imaging Study Demonstrate No Evidence of Ischemia or Infarct By Visual Analysis.   Cardiac PET/CT Myocardial Functional Study:  1- Gated Cardiac PET/CT Functional Study DemonstrateNormal Left Ventricular Ejection Fraction at Rest and Stress as Well as  Normal Left Ventricular Volumes.  2- Gated Cardiac PET/CT Functional Study Demonstrate Normal Left Ventricular Systolic Function and Normal Wall Thickening.  3- No Evidence of Increased Tracer Uptake in The Right Ventricular Free Wall.    Cardiac PET/CT Global Myocardial Blood Flow Quantitation:   Rest Myocardial Blood Flow - 1.0 ml/min/g  Stress Myocardial Blood Flow: 1.2 ml/min/g  Myocardial Blood Flow Reserve: 1.2 ml/min/g ( Normal >62m66min/g )   Cardiac CT Viewer: Evidence Of Significant Coronary Artery Calcifications.   Conclusion: Significant Coronary Artery Calcifications. No Evidence of Ischemia or Infarct By Visual Analysis. Abnormal  Myocardial Blood FlowReserve Suggestive of Balanced Ischemia and/or Microvascular Disease.  Normal Left Ventricle Ejection  Fraction. Normal Left Ventricular Systolic Function and Volumes.        Laboratory Data:  High Sensitivity Troponin:   Recent Labs  Lab 06/28/20 2037 06/28/20 2237  TROPONINIHS 11 10     Chemistry Recent Labs  Lab 06/28/20 2037 07/03/20 1025 07/04/20 0405  NA 130* 130* 134*  K 4.2 4.9 3.8  CL 95* 96* 96*  CO2 26 20* 30  GLUCOSE 76 47* 86  BUN 19 22* 7  CREATININE 7.85* 11.17* 6.01*  CALCIUM 9.2 8.4* 8.7*  GFRNONAA 5* 4* 7*  ANIONGAP '9 14 8    '$ Recent Labs  Lab 06/28/20 2119 07/03/20 1025 07/04/20 0405  PROT 5.7* 5.8*  --    ALBUMIN 2.6* 2.5* 2.4*  AST 15 17  --   ALT 7 9  --   ALKPHOS 89 91  --   BILITOT 0.7 1.4*  --    Hematology Recent Labs  Lab 06/28/20 2037 07/03/20 1025 07/04/20 0405  WBC 4.0 3.6* 2.7*  RBC 3.59* 3.49* 3.30*  HGB 11.4* 11.0* 10.4*  HCT 35.5* 35.6* 32.3*  MCV 98.9 102.0* 97.9  MCH 31.8 31.5 31.5  MCHC 32.1 30.9 32.2  RDW 15.7* 15.8* 15.3  PLT 169 200 194   BNPNo results for input(s): BNP, PROBNP in the last 168 hours.  DDimer  Recent Labs  Lab 06/28/20 2119  DDIMER 3.80*     Radiology/Studies:  DG Chest Port 1 View  Result Date: 07/03/2020 CLINICAL DATA:  Weakness. EXAM: PORTABLE CHEST 1 VIEW COMPARISON:  April 28, 2020. FINDINGS: Stable cardiomegaly is noted with central pulmonary vascular congestion. No pneumothorax is noted. Mild bibasilar atelectasis or edema is noted with small pleural effusions, left greater than right. Bony thorax is unremarkable. IMPRESSION: Stable cardiomegaly with central pulmonary vascular congestion. Mild bibasilar atelectasis or edema is noted with small pleural effusions. Aortic Atherosclerosis (ICD10-I70.0). Electronically Signed   By: Marijo Conception M.D.   On: 07/03/2020 11:02   ECHOCARDIOGRAM COMPLETE  Result Date: 07/04/2020    ECHOCARDIOGRAM REPORT   Patient Name:   NERINE STURMS Date of Exam: 07/04/2020 Medical Rec #:  [id number]        Height:       62.0 in Accession #:    [id number]       Weight:       167.1 lb Date of Birth:  26-Oct-1960         BSA:          1.771 m Patient Age:    13 years         BP:           186/82 mmHg Patient Gender: F                HR:           80 bpm. Exam Location:  Inpatient Procedure: 2D Echo, Cardiac Doppler and Color Doppler Indications:    Murmur  History:        Patient has prior history of Echocardiogram examinations, most                 recent 12/12/2017. COPD; Risk Factors:Hypertension and Diabetes.  Sonographer:    Cammy Brochure Referring Phys: [phone] [place]  1. Left ventricular  ejection fraction, by estimation, is 60 to 65%. The left ventricle has normal function. The left ventricle has no regional wall motion abnormalities. There is mild concentric left ventricular hypertrophy.  Left ventricular diastolic parameters are consistent with Grade II diastolic dysfunction (pseudonormalization). Elevated left atrial pressure.  2. Right ventricular systolic function is normal. The right ventricular size is normal. There is mildly elevated pulmonary artery systolic pressure. The estimated right ventricular systolic pressure is 0000000 mmHg.  3. Left atrial size was severely dilated.  4. The mitral valve is degenerative. Mild to moderate mitral valve regurgitation. Mild to moderate mitral stenosis. The mean mitral valve gradient is 8.0 mmHg. Moderate to severe mitral annular calcification.  5. The aortic valve is tricuspid. There is mild calcification of the aortic valve. There is moderate thickening of the aortic valve. Aortic valve regurgitation is not visualized. Mild aortic valve stenosis. Aortic valve mean gradient measures 18.0 mmHg.  Aortic valve Vmax measures 2.88 m/s.  6. The inferior vena cava is dilated in size with >50% respiratory variability, suggesting right atrial pressure of 8 mmHg. Comparison(s): Prior images unable to be directly viewed, comparison made by report only. FINDINGS  Left Ventricle: Left ventricular ejection fraction, by estimation, is 60 to 65%. The left ventricle has normal function. The left ventricle has no regional wall motion abnormalities. The left ventricular internal cavity size was normal in size. There is  mild concentric left ventricular hypertrophy. Left ventricular diastolic parameters are consistent with Grade II diastolic dysfunction (pseudonormalization). Elevated left atrial pressure. Right Ventricle: The right ventricular size is normal. No increase in right ventricular wall thickness. Right ventricular systolic function is normal. There is mildly  elevated pulmonary artery systolic pressure. The tricuspid regurgitant velocity is 2.70  m/s, and with an assumed right atrial pressure of 8 mmHg, the estimated right ventricular systolic pressure is 0000000 mmHg. Left Atrium: Left atrial size was severely dilated. Right Atrium: Right atrial size was normal in size. Pericardium: There is no evidence of pericardial effusion. Mitral Valve: The mitral valve is degenerative in appearance. There is mild thickening of the mitral valve leaflet(s). Moderate to severe mitral annular calcification. Mild to moderate mitral valve regurgitation, with centrally-directed jet. Mild to moderate mitral valve stenosis. MV peak gradient, 16.8 mmHg. The mean mitral valve gradient is 8.0 mmHg with average heart rate of 66 bpm. Tricuspid Valve: The tricuspid valve is normal in structure. Tricuspid valve regurgitation is mild . No evidence of tricuspid stenosis. Aortic Valve: The aortic valve is tricuspid. There is mild calcification of the aortic valve. There is moderate thickening of the aortic valve. Aortic valve regurgitation is not visualized. Mild aortic stenosis is present. Aortic valve mean gradient measures 18.0 mmHg. Aortic valve peak gradient measures 33.2 mmHg. Aortic valve area, by VTI measures 1.34 cm. Pulmonic Valve: The pulmonic valve was grossly normal. Pulmonic valve regurgitation is trivial. No evidence of pulmonic stenosis. Aorta: The aortic root is normal in size and structure. Venous: The inferior vena cava is dilated in size with greater than 50% respiratory variability, suggesting right atrial pressure of 8 mmHg. IAS/Shunts: There is right bowing of the interatrial septum, suggestive of elevated left atrial pressure. No atrial level shunt detected by color flow Doppler.  LEFT VENTRICLE PLAX 2D LVIDd:         4.50 cm  Diastology LVIDs:         3.10 cm  LV e' medial:    4.46 cm/s LV PW:         1.30 cm  LV E/e' medial:  33.9 LV IVS:        1.40 cm  LV e' lateral:   7.83  cm/s LVOT diam:  1.90 cm  LV E/e' lateral: 19.3 LV SV:         77 LV SV Index:   44 LVOT Area:     2.84 cm  RIGHT VENTRICLE             IVC RV Basal diam:  2.90 cm     IVC diam: 2.20 cm RV S prime:     12.70 cm/s TAPSE (M-mode): 2.6 cm LEFT ATRIUM             Index       RIGHT ATRIUM           Index LA diam:        4.40 cm 2.48 cm/m  RA Area:     16.60 cm LA Vol (A2C):   95.0 ml 53.64 ml/m RA Volume:   39.50 ml  22.30 ml/m LA Vol (A4C):   98.2 ml 55.44 ml/m LA Biplane Vol: 97.0 ml 54.77 ml/m  AORTIC VALVE AV Area (Vmax):    1.26 cm AV Area (Vmean):   1.25 cm AV Area (VTI):     1.34 cm AV Vmax:           288.00 cm/s AV Vmean:          199.000 cm/s AV VTI:            0.579 m AV Peak Grad:      33.2 mmHg AV Mean Grad:      18.0 mmHg LVOT Vmax:         128.00 cm/s LVOT Vmean:        87.600 cm/s LVOT VTI:          0.273 m LVOT/AV VTI ratio: 0.47  AORTA Ao Root diam: 3.00 cm Ao Asc diam:  3.60 cm MITRAL VALVE                TRICUSPID VALVE MV Area (PHT): 2.23 cm     TR Peak grad:   29.2 mmHg MV Area VTI:   1.52 cm     TR Vmax:        270.00 cm/s MV Peak grad:  16.8 mmHg MV Mean grad:  8.0 mmHg     SHUNTS MV Vmax:       2.05 m/s     Systemic VTI:  0.27 m MV Vmean:      137.0 cm/s   Systemic Diam: 1.90 cm MV Decel Time: 341 msec MV E velocity: 151.00 cm/s MV A velocity: 144.00 cm/s MV E/A ratio:  1.05 Mihai Croitoru MD Electronically signed by Sanda Klein MD Signature Date/Time: 07/04/2020/12:46:59 PM    Final      Assessment and Plan:   1. Uncontrolled HTN - patient missed HD 5/18 and 5/23 in context of not feeling well (?influenza) - reports good control ever since adjustment of her regimen before then - she has not been dialyzed since Friday 5/20 so this is likely contributing to the persistence of elevated BPs at this time, along with home regimen not yet administered - would recommend to follow post-dialysis and consider titrating hydralazine back to prior home dose if needed - thyroid  function added to labs and normal - if BP does not return to baseline consider renal artery duplex  2. Coronary artery calcification with h/o atypical chest pain - recent PET MPI as above - negative for ischemia/infarct but could not exclude balanced ischemia or microvascular disease - not on ASA with prior history of GI bleed  -  currently on beta blocker - consider statin therapy although mortality data limited in ESRD pts - tobacco cessation has been advised - negative troponins - will review further eval with MD  3. ESRD on HD - per nephrology  4. ? Recent influenza - patient states she was tested in ED on Thursday 5/19, I do not see this occurred - Covid/flu swab here was negative but that does not fully exclude recent infection last week otherwise  5. Valvular heart disease with mild AS, mild-moderate MS - anticipate continued OP follow-up, will review with MD  6. PAD - mild-moderate atherosclerotic calcification and tortuosity of the external iliac arteries noted on CT 04/2020 - no true claudication sx but does report generalized weakness of legs - consider noninvasive LE arterial testing  Risk Assessment/Risk Scores:     HEAR Score (for undifferentiated chest pain):  HEAR Score: 3  New York Heart Association (NYHA) Functional Class NYHA Class II in context of ESRD    For questions or updates, please contact [facility] Please consult [email or web address] for contact info under    Signed, Charlie Pitter, PA-C  07/04/2020 3:28 PM

## 2020-07-04 NOTE — Progress Notes (Signed)
[facility] Progress Note  Name: Jodi Bell MRN: [id number] DOB: 1960-12-07  Chief Complaint:  Shortness of breath  Subjective:  had HD on 5/24 with 3 kg UF (early evening). covid returned negative.  Note she is obs status.  She states breathing is better.  Some shortness of breath with working with PT/walking. States she is unclear as to discharge plan.  She thinks she is going home tomorrow.  Review of systems:  Denies n/v No chest pain Shortness of breath with exertion  ---- Background on consult  Jodi Bell is a 60 y.o. female with a history of ESDR on HD through [facility], HTN, DM, obesity and OSA who presented to the ER with shortness of breath.  Shortness of breath has gotten worse since she's been here and she has been coughing more.  She did also have n/v yesterday.  She was recently diagnosed with influenza last Thursday.  She is at [facility] unit through [facility].  Called her outpatient HD unit for orders below.  Missed 5/18 as well - before that has been doing well with attending for several months per nursing.  The patient states that she was too weak to get ready and she states was also found to have low blood sugar.   Intake/Output Summary (Last 24 hours) at 07/04/2020 1048 Last data filed at 07/04/2020 [id number] Gross per 24 hour  Intake 300 ml  Output 3001 ml  Net -2701 ml    Vitals:  Vitals:   07/03/20 2152 07/03/20 2233 07/04/20 0130 07/04/20 0421  BP: (!) 174/85 (!) 194/79 (!) 184/74 (!) 186/82  Pulse: 83 87 76 80  Resp: '20 18 17 17  '$ Temp: 97.8 F (36.6 C) 99.4 F (37.4 C) 98.5 F (36.9 C) 98.7 F (37.1 C)  TempSrc: Oral Oral Oral Oral  SpO2: 99% 90% 92% 92%     Physical Exam:  General: adult female in bed in NAD at rest HEENT: NCAT Eyes: EOMI sclera anicteric Neck: supple trachea midline Heart:S1S2 no rub Lungs: unlabored on room air Abdomen:  Soft/NT/ND Extremities: no pitting edema appreciated; no cyanosis or  clubbing Skin: no rash on extremities exposed Neuro: alert and oriented x 3 provides hx and follows commands Access - LUE AVF thrill    Medications reviewed   Labs:  BMP Latest Ref Rng & Units 07/04/2020 07/03/2020 06/28/2020  Glucose 70 - 99 mg/dL 86 47(L) 76  BUN 6 - 20 mg/dL 7 22(H) 19  Creatinine 0.44 - 1.00 mg/dL 6.01(H) 11.17(H) 7.85(H)  Sodium 135 - 145 mmol/L 134(L) 130(L) 130(L)  Potassium 3.5 - 5.1 mmol/L 3.8 4.9 4.2  Chloride 98 - 111 mmol/L 96(L) 96(L) 95(L)  CO2 22 - 32 mmol/L 30 20(L) 26  Calcium 8.9 - 10.3 mg/dL 8.7(L) 8.4(L) 9.2    Outpatient HD orders:  [facility] MWF Left arm AVF EDW 72.5 kg  3.5 hours Didn't come 5/23; last HD was 5/20 with post weight 72.5 kg  2/2.5 Ca  Heparin 3200 bolus and 500 hourly aranesp 85 mcg once weekly last dose on 5/20 venofer 50 mg weekly hectorol out - now on zemplar 6 mcg each treatment Normally between 1.5 - 2.5 kg between treatment   Assessment/Plan:   # ESRD - HD usually per MWF schedule - Truncated treatment today to get back on schedule    # Flu positive - per primary team - optimize volume status with HD  # HTN  - Continue home regimen per team and  optimize volume status with HD   # Anemia CKD  - s/p recent aranesp dose on 5/20 - no ESA indicated at this time   # Metabolic bone disease - zemplar 6 mcg each tx outpatient and on sensipar 30 mg MWF at home (not daily as on bottle - per pt has been reduced)  Disposition per primary team. States she is going home tomorrow.  She is obs   Claudia Desanctis, MD 07/04/2020 11:08 AM

## 2020-07-04 NOTE — Progress Notes (Signed)
Family Medicine Teaching Service Daily Progress Note Intern Pager: [phone]  Patient name: Jodi Bell Medical record number: [id number] Date of birth: 08/20/1960 Age: 60 y.o. Gender: female  Primary Care Provider: [name]., MD Consultants: Nephrology Code Status: Full  Pt Overview and Major Events to Date:  07/04/2020: Admitted to FPTS  Assessment and Plan: Jodi Bell is a 60 y.o. female presenting with weakness. 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    Weakness-Improving I Hypoglycemia Patient is improving but still feels weak this morning. On arrival patient's blood sugars in 40's, improved with D10 bolus.Thought likely due to hypoglycemia in setting of Viral URI given symptoms.  Afebrile. WBC-2.7.  Less likely COPD exacerbtion given minimal sputum production.   Unlikely PE given Wells score of 0.   Cardiomegaly on exam and suspected cardiomegaly on chest X-Ray.   Echo pending Patient also ESRD with MWF and has missed dialysis on Monday.   -- Vital signs q4hr -- CBG q2hr -- Renal diet -- Daily weights -- f/u ECHO   ESRD on MWF HD Nephrology consulted yesterday and dialysis session last night.  Dry weight is 73 kg.  Indicates she couldn't go on Monday as she was sick.  No edema appreciated in lower extremities and as per nephro they might put her on regular schedule based on their staffing. -- Nephro following, appreciate recs  Anemia of chronic disease --No ESA indicated at this time as per nephrology --s/p recent Aranesp dose on 99991111  Metabolic bone disease Home meds: Zemplar 6 MCG H treatment outpatient and on Sensipar 30 mg MWF at home.  [facility] Sensipar 30 mg every day but per patient it has been reduced orally for MWF -- Continue with same plan  Chronic Nausea Patient takes Zofran at home.  Holding in setting of Prolonged QTc 517 --May consider restarting if nausea develops  COPD/Asthma   Home meds:  Spiriva 2 puffs BID, Singulair daily, and Albuterol as needed.   Patient breathing normally on room air --c/w albuterol, guanfacine 200 mg  Type 2 DM Last A1C was 5.6 on 02/17/20.  On Januvia 25 mg daily.  Last took yesterday. - Hold Januvia in setting of Hypoglycemia  HTN Home meds: clonidine 0.3 mg  3 times daily, Lasix 40 mg twice daily, hydralazine 50 mg 3 times daily, Imdur 60 mg daily, labetalol 400 mg 3 times daily, olmesartan 40 mg daily, norvasc 10 mg.  Cardiology consulted, awaiting their recommendation --c/w Norvasc 10 mg, clonidine 0.3 mg, hydralazine 50 mg BID, irbesartan 300 mg, Imdur 60 mg, Lasix 40 mg BID, labetalol 400 mg TID --Awaiting cardiology recommendations  Rheumatoid Arthritis On Xeljanz 5 mg daily.  Patient was also taking 5 mg prednisone, ran out of it 2 weeks ago.  To rule out any adrenal insufficiency we have ordered random cortisol, if needed we will follow-up with morning cortisol. -- Hold Morrie Sheldon --Hold prednisone 5 mg --f/u random Cortiosol  Anxiety Takes Ativan as needed for anxiety. - Hold Ativan  Insomnia Receives Ambien nightly - c/w Ambien  Tobacco Use Currently smoking  - d/c nicotine patch   FEN/GI: Renal diet with fluid restriction PPx: Sub Q Heparin   Status is: Inpatient  The patient will require care spanning > 2 midnights and should be moved to inpatient because: Ongoing diagnostic testing needed not appropriate for outpatient work up  Dispo: The patient is from: Home  Anticipated d/c is to: Home              Patient currently is not medically stable to d/c.   Difficult to place patient No        Subjective:  Patient evaluated at bedside this morning.  She complains of constipation and would like to restart her home medication along with prune juice.  She denies any nausea, vomiting, dizziness, headache, chest pain, abdominal pain but states she would need her sleep medication Ambien  because was not able to sleep well last night.  No other complaints  Objective: Temp:  [97.8 F (36.6 C)-99.4 F (37.4 C)] 98.7 F (37.1 C) (05/25 0421) Pulse Rate:  [73-95] 80 (05/25 0421) Resp:  [15-24] 17 (05/25 0421) BP: (140-194)/(65-89) 186/82 (05/25 0421) SpO2:  [90 %-99 %] 92 % (05/25 0421) Physical Exam: General: Pleasant lady lying comfortably in bed, NAD Cardiovascular: Regular rate and rhythm,+ murmur Respiratory: Patient breathing comfortably on room air Abdomen: Nondistended, nontender, bowel sounds present Extremities: No Edema appreciated  Laboratory: Recent Labs  Lab 06/28/20 2037 07/03/20 1025 07/04/20 0405  WBC 4.0 3.6* 2.7*  HGB 11.4* 11.0* 10.4*  HCT 35.5* 35.6* 32.3*  PLT 169 200 194   Recent Labs  Lab 06/28/20 2037 06/28/20 2119 07/03/20 1025 07/04/20 0405  NA 130*  --  130* 134*  K 4.2  --  4.9 3.8  CL 95*  --  96* 96*  CO2 26  --  20* 30  BUN 19  --  22* 7  CREATININE 7.85*  --  11.17* 6.01*  CALCIUM 9.2  --  8.4* 8.7*  PROT  --  5.7* 5.8*  --   BILITOT  --  0.7 1.4*  --   ALKPHOS  --  89 91  --   ALT  --  7 9  --   AST  --  15 17  --   GLUCOSE 76  --  47* 86    Imaging/Diagnostic Tests:No results found.  Honor Junes, MD 07/04/2020, 11:03 AM PGY-1, [place] Intern pager: [phone], text pages welcome

## 2020-07-05 DIAGNOSIS — N186 End stage renal disease: Secondary | ICD-10-CM | POA: Diagnosis not present

## 2020-07-05 DIAGNOSIS — R0600 Dyspnea, unspecified: Secondary | ICD-10-CM

## 2020-07-05 LAB — CBC
HCT: 33.4 % — ABNORMAL LOW (ref 36.0–46.0)
Hemoglobin: 11 g/dL — ABNORMAL LOW (ref 12.0–15.0)
MCH: 32 pg (ref 26.0–34.0)
MCHC: 32.9 g/dL (ref 30.0–36.0)
MCV: 97.1 fL (ref 80.0–100.0)
Platelets: 193 10*3/uL (ref 150–400)
RBC: 3.44 MIL/uL — ABNORMAL LOW (ref 3.87–5.11)
RDW: 15.5 % (ref 11.5–15.5)
WBC: 2.6 10*3/uL — ABNORMAL LOW (ref 4.0–10.5)
nRBC: 0 % (ref 0.0–0.2)

## 2020-07-05 LAB — BASIC METABOLIC PANEL
Anion gap: 10 (ref 5–15)
BUN: <5 mg/dL — ABNORMAL LOW (ref 6–20)
CO2: 29 mmol/L (ref 22–32)
Calcium: 9.1 mg/dL (ref 8.9–10.3)
Chloride: 92 mmol/L — ABNORMAL LOW (ref 98–111)
Creatinine, Ser: 4.65 mg/dL — ABNORMAL HIGH (ref 0.44–1.00)
GFR, Estimated: 10 mL/min — ABNORMAL LOW (ref >60)
Glucose, Bld: 110 mg/dL — ABNORMAL HIGH (ref 70–99)
Potassium: 3.4 mmol/L — ABNORMAL LOW (ref 3.5–5.1)
Sodium: 131 mmol/L — ABNORMAL LOW (ref 135–145)

## 2020-07-05 LAB — GLUCOSE, CAPILLARY
Glucose-Capillary: 144 mg/dL — ABNORMAL HIGH (ref 70–99)
Glucose-Capillary: 182 mg/dL — ABNORMAL HIGH (ref 70–99)
Glucose-Capillary: 82 mg/dL (ref 70–99)
Glucose-Capillary: 89 mg/dL (ref 70–99)
Glucose-Capillary: 95 mg/dL (ref 70–99)

## 2020-07-05 LAB — CORTISOL-AM, BLOOD: Cortisol - AM: 3.1 ug/dL — ABNORMAL LOW (ref 6.7–22.6)

## 2020-07-05 MED ORDER — PREDNISONE 10 MG PO TABS
10.0000 mg | ORAL_TABLET | Freq: Every day | ORAL | Status: AC
  Administered 2020-07-05 – 2020-07-06 (×2): 10 mg via ORAL
  Filled 2020-07-05 (×2): qty 1

## 2020-07-05 MED ORDER — LABETALOL HCL 200 MG PO TABS
400.0000 mg | ORAL_TABLET | Freq: Three times a day (TID) | ORAL | Status: DC
  Administered 2020-07-05 – 2020-07-06 (×4): 400 mg via ORAL
  Filled 2020-07-05 (×4): qty 2

## 2020-07-05 MED ORDER — PREDNISONE 5 MG PO TABS: 5.0000 mg | ORAL_TABLET | Freq: Every day | ORAL | Status: DC

## 2020-07-05 MED ORDER — CHLORHEXIDINE GLUCONATE CLOTH 2 % EX PADS: 6.0000 | MEDICATED_PAD | Freq: Every day | CUTANEOUS | Status: DC

## 2020-07-05 MED ORDER — AMLODIPINE BESYLATE 10 MG PO TABS
10.0000 mg | ORAL_TABLET | Freq: Every day | ORAL | Status: DC
  Administered 2020-07-05 – 2020-07-06 (×2): 10 mg via ORAL
  Filled 2020-07-05 (×2): qty 1

## 2020-07-05 MED ORDER — HYDRALAZINE HCL 50 MG PO TABS
50.0000 mg | ORAL_TABLET | Freq: Two times a day (BID) | ORAL | Status: DC
  Administered 2020-07-05: 50 mg via ORAL
  Filled 2020-07-05: qty 1

## 2020-07-05 MED ORDER — ISOSORBIDE MONONITRATE ER 60 MG PO TB24
60.0000 mg | ORAL_TABLET | Freq: Every day | ORAL | Status: DC
  Administered 2020-07-05: 60 mg via ORAL
  Filled 2020-07-05 (×2): qty 1

## 2020-07-05 MED ORDER — POTASSIUM CHLORIDE CRYS ER 20 MEQ PO TBCR
40.0000 meq | EXTENDED_RELEASE_TABLET | Freq: Once | ORAL | Status: AC
  Administered 2020-07-05: 40 meq via ORAL
  Filled 2020-07-05: qty 2

## 2020-07-05 MED ORDER — IRBESARTAN 300 MG PO TABS
300.0000 mg | ORAL_TABLET | Freq: Every day | ORAL | Status: DC
Start: 2020-07-05 — End: 2020-07-06
  Administered 2020-07-05: 300 mg via ORAL
  Filled 2020-07-05 (×2): qty 1

## 2020-07-05 MED ORDER — HYDRALAZINE HCL 50 MG PO TABS
100.0000 mg | ORAL_TABLET | Freq: Three times a day (TID) | ORAL | Status: DC
  Administered 2020-07-05 – 2020-07-06 (×4): 100 mg via ORAL
  Filled 2020-07-05 (×4): qty 2

## 2020-07-05 NOTE — Discharge Instructions (Signed)
Dear Ms. Lovena Le,  Please continue taking prednisone 5 mg and see your PCP within a week Also, continue taking all your BP medications along with regular check-ups at home but see your PCP within a week to check your BP medications and for any adjustment that is needed.  It was pleasure taking care of you!

## 2020-07-05 NOTE — Discharge Summary (Addendum)
[facility] Discharge Summary  Patient name: Jodi Bell Medical record number: [id number] Date of birth: 03-17-1960 Age: 60 y.o. Gender: female Date of Admission: 07/03/2020  Date of Discharge: 07/06/2020  Admitting Physician: Delora Fuel, MD  Primary Care Provider: [name]., MD Consultants: Nephrology, Cardiology  Indication for Hospitalization:  Uncontrolled hypertension Weakness  Discharge Diagnoses/Problem List:  1.  Acute adrenal insufficiency. Weakness with low AM cortisol suggestive of  Adrenal insufficiency from recent withdrawal of chronic prednisone  3.  Uncontrolled renovascular hypertension 3.  ESRD on HD MWF 4. Rheumatoid arthritis  Disposition: Home  Discharge Condition: Stable  Discharge Exam:   Vitals with BMI 07/06/2020 07/06/2020 07/06/2020  Height - - -  Weight - 149 lbs 1 oz -  BMI - 99991111 -  Systolic Q000111Q [id number]  Diastolic 73 83 83  Pulse 92 88 87    Physical Exam Constitutional:      General: She is not in acute distress.    Appearance: Normal appearance. She is not ill-appearing.  HENT:     Head: Normocephalic and atraumatic.     Mouth/Throat:     Mouth: Mucous membranes are moist.  Cardiovascular:     Rate and Rhythm: Normal rate and regular rhythm.  Pulmonary:     Effort: Pulmonary effort is normal.     Breath sounds: Normal breath sounds.  Abdominal:     General: Abdomen is flat.  Skin:    General: Skin is warm.  Neurological:     General: No focal deficit present.     Mental Status: She is alert.     Brief Hospital Course:   1. Acute adrenal insufficiency Both random and AM cortisol low at 4.1 and 3.1 respectively. Patient has been taking prednisone almost daily with last refill of 5 mg tabs start date 03/13/2020 and 5 refills. We gave 2 days 10 mg a day and will discharge on 5 mg a day orally. To be tapered or continued per PCP with followup scheduled.  2. Uncontrolled renovascular  hypertension BP ranged from [id number] systolic and diastolic in [id number] range. Seen by both cardiology and nephrology in consult for renovascualr hypertension with poor control. They recommended continuing current outpatient regimen with an increased dose of hydralazine as below and follow up by cardiology and nephrology.  3. ESRD on hemodialysis Continue HD and regarding BP medications continue the lasix 40 mg BID (daily including dialysis days) - continue her established regimen  - anticipate lower EDW at future diaalysis  4. Rheumatoid arthritis  Needs follow up with rheumatology. Has been seen at [facility] and was not able to make her appointment in February of this year. Had been taking 5 mg  Prednisone on most days until 2 weeks prior to admission Needs to re-establish care with rheumatology. At one time was on [place] but did not receive  That this brief hospital stay.  Issues for Follow Up:  1. Consider CBC and BMP in 1 week 2. BP in 1 week with PCP 3. PCP appointment for further management (continuationo r tapering) of prednisone    Significant Procedures: ECHO  Significant Labs and Imaging:  Recent Labs  Lab 07/04/20 0405 07/05/20 0605 07/06/20 0349  WBC 2.7* 2.6* 1.7*  HGB 10.4* 11.0* 10.9*  HCT 32.3* 33.4* 33.3*  PLT 194 193 225   Recent Labs  Lab 07/03/20 1025 07/04/20 0405 07/05/20 0605 07/06/20 0349  NA 130* 134* 131* 130*  K 4.9 3.8 3.4* 4.8  CL 96* 96* 92* 96*  CO2 20* '30 29 28  '$ GLUCOSE 47* 86 110* 126*  BUN 22* 7 <5* 6  CREATININE 11.17* 6.01* 4.65* 6.25*  CALCIUM 8.4* 8.7* 9.1 9.4  PHOS  --  2.7  --   --   ALKPHOS 91  --   --   --   AST 17  --   --   --   ALT 9  --   --   --   ALBUMIN 2.5* 2.4*  --   --    DG Chest 2 View  Result Date: 06/28/2020 CLINICAL DATA:  chest pain EXAM: CHEST - 2 VIEW COMPARISON:  May 22, 2020 FINDINGS: Enlarged cardiac silhouette with central vascular congestion. Bilateral interstitial thickening. No visible pneumothorax or  pleural effusion. The visualized skeletal structures are unremarkable. IMPRESSION: Enlarged cardiac silhouette with central vascular congestion and bilateral interstitial thickening, suggestive of CHF. Electronically Signed   By: Dahlia Bailiff MD   On: 06/28/2020 21:11   DG Chest 2 View  Result Date: 05/22/2020 CLINICAL DATA:  Short of breath, lower extremity edema EXAM: CHEST - 2 VIEW COMPARISON:  12/15/2019 FINDINGS: Frontal and lateral views of the chest demonstrate an enlarged cardiac silhouette. There is central vascular congestion without acute airspace disease. Are trace bilateral pleural effusions. No pneumothorax. No acute bony abnormalities. IMPRESSION: 1. Central vascular congestion without overt edema. 2. Trace bilateral pleural effusions. Electronically Signed   By: Randa Ngo M.D.   On: 05/22/2020 17:28   NM Pulmonary Perfusion  Result Date: 06/29/2020 CLINICAL DATA:  Chest pain, dyspnea on exertion, elevated D-dimer EXAM: NUCLEAR MEDICINE PERFUSION LUNG SCAN TECHNIQUE: Perfusion images were obtained in multiple projections after intravenous injection of radiopharmaceutical. Ventilation scans intentionally deferred if perfusion scan and chest x-ray adequate for interpretation during COVID 19 epidemic. RADIOPHARMACEUTICALS:  4.4 mCi Tc-87mMAA IV COMPARISON:  None. FINDINGS: There is a normal distribution of radiotracer throughout the lungs. No perfusion defects are identified. IMPRESSION: Normal examination.  No evidence of pulmonary embolism. Electronically Signed   By: AFidela SalisburyMD   On: 06/29/2020 01:17   DG Chest Port 1 View  Result Date: 07/03/2020 CLINICAL DATA:  Weakness. EXAM: PORTABLE CHEST 1 VIEW COMPARISON:  April 28, 2020. FINDINGS: Stable cardiomegaly is noted with central pulmonary vascular congestion. No pneumothorax is noted. Mild bibasilar atelectasis or edema is noted with small pleural effusions, left greater than right. Bony thorax is unremarkable. IMPRESSION:  Stable cardiomegaly with central pulmonary vascular congestion. Mild bibasilar atelectasis or edema is noted with small pleural effusions. Aortic Atherosclerosis (ICD10-I70.0). Electronically Signed   By: JMarijo ConceptionM.D.   On: 07/03/2020 11:02   ECHOCARDIOGRAM COMPLETE  Result Date: 07/04/2020    ECHOCARDIOGRAM REPORT   Patient Name:   LCAITIE DELINEDate of Exam: 07/04/2020 Medical Rec #:  [id number]       Height:       62.0 in Accession #:    [id number]      Weight:       167.1 lb Date of Birth:  [birth year]        BSA:          1.771 m Patient Age:    60 years         BP:           186/82 mmHg Patient Gender: F                HR:  80 bpm. Exam Location:  Inpatient Procedure: 2D Echo, Cardiac Doppler and Color Doppler Indications:    Murmur  History:        Patient has prior history of Echocardiogram examinations, most                 recent 12/12/2017. COPD; Risk Factors:Hypertension and Diabetes.  Sonographer:    Cammy Brochure Referring Phys: [phone] [place]  1. Left ventricular ejection fraction, by estimation, is 60 to 65%. The left ventricle has normal function. The left ventricle has no regional wall motion abnormalities. There is mild concentric left ventricular hypertrophy. Left ventricular diastolic parameters are consistent with Grade II diastolic dysfunction (pseudonormalization). Elevated left atrial pressure.  2. Right ventricular systolic function is normal. The right ventricular size is normal. There is mildly elevated pulmonary artery systolic pressure. The estimated right ventricular systolic pressure is 0000000 mmHg.  3. Left atrial size was severely dilated.  4. The mitral valve is degenerative. Mild to moderate mitral valve regurgitation. Mild to moderate mitral stenosis. The mean mitral valve gradient is 8.0 mmHg. Moderate to severe mitral annular calcification.  5. The aortic valve is tricuspid. There is mild calcification of the aortic valve. There is moderate  thickening of the aortic valve. Aortic valve regurgitation is not visualized. Mild aortic valve stenosis. Aortic valve mean gradient measures 18.0 mmHg.  Aortic valve Vmax measures 2.88 m/s.  6. The inferior vena cava is dilated in size with >50% respiratory variability, suggesting right atrial pressure of 8 mmHg. Comparison(s): Prior images unable to be directly viewed, comparison made by report only. FINDINGS  Left Ventricle: Left ventricular ejection fraction, by estimation, is 60 to 65%. The left ventricle has normal function. The left ventricle has no regional wall motion abnormalities. The left ventricular internal cavity size was normal in size. There is  mild concentric left ventricular hypertrophy. Left ventricular diastolic parameters are consistent with Grade II diastolic dysfunction (pseudonormalization). Elevated left atrial pressure. Right Ventricle: The right ventricular size is normal. No increase in right ventricular wall thickness. Right ventricular systolic function is normal. There is mildly elevated pulmonary artery systolic pressure. The tricuspid regurgitant velocity is 2.70  m/s, and with an assumed right atrial pressure of 8 mmHg, the estimated right ventricular systolic pressure is 0000000 mmHg. Left Atrium: Left atrial size was severely dilated. Right Atrium: Right atrial size was normal in size. Pericardium: There is no evidence of pericardial effusion. Mitral Valve: The mitral valve is degenerative in appearance. There is mild thickening of the mitral valve leaflet(s). Moderate to severe mitral annular calcification. Mild to moderate mitral valve regurgitation, with centrally-directed jet. Mild to moderate mitral valve stenosis. MV peak gradient, 16.8 mmHg. The mean mitral valve gradient is 8.0 mmHg with average heart rate of 66 bpm. Tricuspid Valve: The tricuspid valve is normal in structure. Tricuspid valve regurgitation is mild . No evidence of tricuspid stenosis. Aortic Valve: The  aortic valve is tricuspid. There is mild calcification of the aortic valve. There is moderate thickening of the aortic valve. Aortic valve regurgitation is not visualized. Mild aortic stenosis is present. Aortic valve mean gradient measures 18.0 mmHg. Aortic valve peak gradient measures 33.2 mmHg. Aortic valve area, by VTI measures 1.34 cm. Pulmonic Valve: The pulmonic valve was grossly normal. Pulmonic valve regurgitation is trivial. No evidence of pulmonic stenosis. Aorta: The aortic root is normal in size and structure. Venous: The inferior vena cava is dilated in size with greater than 50% respiratory variability,  suggesting right atrial pressure of 8 mmHg. IAS/Shunts: There is right bowing of the interatrial septum, suggestive of elevated left atrial pressure. No atrial level shunt detected by color flow Doppler.  LEFT VENTRICLE PLAX 2D LVIDd:         4.50 cm  Diastology LVIDs:         3.10 cm  LV e' medial:    4.46 cm/s LV PW:         1.30 cm  LV E/e' medial:  33.9 LV IVS:        1.40 cm  LV e' lateral:   7.83 cm/s LVOT diam:     1.90 cm  LV E/e' lateral: 19.3 LV SV:         77 LV SV Index:   44 LVOT Area:     2.84 cm  RIGHT VENTRICLE             IVC RV Basal diam:  2.90 cm     IVC diam: 2.20 cm RV S prime:     12.70 cm/s TAPSE (M-mode): 2.6 cm LEFT ATRIUM             Index       RIGHT ATRIUM           Index LA diam:        4.40 cm 2.48 cm/m  RA Area:     16.60 cm LA Vol (A2C):   95.0 ml 53.64 ml/m RA Volume:   39.50 ml  22.30 ml/m LA Vol (A4C):   98.2 ml 55.44 ml/m LA Biplane Vol: 97.0 ml 54.77 ml/m  AORTIC VALVE AV Area (Vmax):    1.26 cm AV Area (Vmean):   1.25 cm AV Area (VTI):     1.34 cm AV Vmax:           288.00 cm/s AV Vmean:          199.000 cm/s AV VTI:            0.579 m AV Peak Grad:      33.2 mmHg AV Mean Grad:      18.0 mmHg LVOT Vmax:         128.00 cm/s LVOT Vmean:        87.600 cm/s LVOT VTI:          0.273 m LVOT/AV VTI ratio: 0.47  AORTA Ao Root diam: 3.00 cm Ao Asc diam:  3.60 cm  MITRAL VALVE                TRICUSPID VALVE MV Area (PHT): 2.23 cm     TR Peak grad:   29.2 mmHg MV Area VTI:   1.52 cm     TR Vmax:        270.00 cm/s MV Peak grad:  16.8 mmHg MV Mean grad:  8.0 mmHg     SHUNTS MV Vmax:       2.05 m/s     Systemic VTI:  0.27 m MV Vmean:      137.0 cm/s   Systemic Diam: 1.90 cm MV Decel Time: 341 msec MV E velocity: 151.00 cm/s MV A velocity: 144.00 cm/s MV E/A ratio:  1.05 Mihai Croitoru MD Electronically signed by Sanda Klein MD Signature Date/Time: 07/04/2020/12:46:59 PM    Final     Results/Tests Pending at Time of Discharge: None  Discharge Medications:  Allergies as of 07/06/2020      Reactions   3-methyl-2-benzothiazolinone Hydrazone Other (See Comments)   Muscle  weakness muscle cramping in legs Other reaction(s): Myalgias (Muscle Pain)   Banana Anaphylaxis, Swelling   TONGUE AND MOUTH SWELLING   Black Walnut Flavor Anaphylaxis   Walnuts   Hazelnut (filbert) Allergy Skin Test Anaphylaxis   Hazelnuts   Leflunomide And Related Other (See Comments)   Severe Headache Other reaction(s): Headache Severe Headache Severe Headache    Lisinopril Swelling   Angioedema  Swelling of the face Angioedema  Swelling of the face Other reaction(s): Facial Swelling   No Healtht[employer] Food Allergies Anaphylaxis   Spicy Mustard 4.7.2021 Pt reports that she eats Regular Yellow Mustard   Other Other (See Comments), Anaphylaxis, Swelling   Cosentyx= angioedema Other reaction(s): Facial Edema (intolerance) Mouth itches and tongue swelling Hazel nuts and pecans also Walnuts Walnuts Renal failure Other reaction(s): Other Serotonin Syndrome  Serotonin syndrome  Tremors Other reaction(s): Other Tremors Muscle weakness muscle cramping in legs Other reaction(s): Myalgias (Muscle Pain) Mouth itches and tongue swelling Hazel nuts and pecans also Walnuts   Pecan Extract Allergy Skin Test Anaphylaxis   Pecans Pecans Pecans   Pecan Nut (diagnostic)  Anaphylaxis   Pecans   Trazodone And Nefazodone Other (See Comments)   Serotonin Syndrome  Serotonin syndrome  Serotonin Syndrome  Serotonin syndrome  Tremors Other reaction(s): Other Tremors   Adalimumab Other (See Comments)   Blisters on abdomen =humira Blisters on abdomen =humira Blisters on abdomen =humira   Escitalopram Oxalate Other (See Comments)   Hand problems - Serotonin Syndrome  Hand problems - Serotonin Syndrome  Hand problems - Serotonin Syndrome  Hand problems    Ezetimibe Other (See Comments)   myalgias cramps myalgias cramps   Secukinumab Swelling   Cosentyx = angiodema Cosentyx = angiodema   Statins Other (See Comments)   Leg pains and weakness Leg pains and weakness   Ibuprofen Other (See Comments)   Renal Problems      Medication List    STOP taking these medications   esomeprazole 20 MG capsule Commonly known as: NEXIUM     TAKE these medications   acetaminophen 500 MG tablet Commonly known as: TYLENOL Take 1,000 mg by mouth every 6 (six) hours as needed for mild pain.   allopurinol 100 MG tablet Commonly known as: ZYLOPRIM Take 100 mg by mouth every evening.   amLODipine 10 MG tablet Commonly known as: NORVASC Take 10 mg by mouth daily.   budesonide-formoterol 160-4.5 MCG/ACT inhaler Commonly known as: SYMBICORT Inhale 2 puffs into the lungs 2 (two) times daily.   calcium acetate 667 MG capsule Commonly known as: PHOSLO Take 3 capsules by mouth in the morning and at bedtime. Take 2 capsules with snacks.   carboxymethylcellulose 1 % ophthalmic solution Place 1 drop into both eyes 4 (four) times daily.   cetirizine 10 MG tablet Commonly known as: ZYRTEC Take 10 mg by mouth 2 (two) times daily as needed (hives).   cinacalcet 30 MG tablet Commonly known as: SENSIPAR Take 30 mg by mouth 3 (three) times a week. Tuesday, Thursday, Saturday.   cloNIDine 0.3 MG tablet Commonly known as: CATAPRES Take 1 tablet (0.3 mg total) by  mouth 3 (three) times daily.   cyclobenzaprine 10 MG tablet Commonly known as: FLEXERIL Take 10 mg by mouth 3 (three) times daily as needed for muscle spasms.   ELDERBERRY PO Take 2 drops by mouth daily.   EPINEPHrine 0.3 mg/0.3 mL Soaj injection Commonly known as: EpiPen Inject 0.3 mLs (0.3 mg total) into the muscle once. What changed:  when to take this  reasons to take this   ferrous sulfate 325 (65 FE) MG tablet Take 325 mg by mouth 3 (three) times a week. TUE THUR SAT   fluticasone 50 MCG/ACT nasal spray Commonly known as: FLONASE Place 1 spray into both nostrils daily.   folic acid Q000111Q MCG tablet Commonly known as: FOLVITE Take 400 mcg by mouth daily.   furosemide 40 MG tablet Commonly known as: LASIX Take 40 mg by mouth 2 (two) times daily.   gabapentin 300 MG capsule Commonly known as: Neurontin Take 1 capsule (300 mg total) by mouth at bedtime as needed (Neuropathic pain). What changed:   when to take this  reasons to take this   hydrALAZINE 100 MG tablet Commonly known as: APRESOLINE Take 1 tablet (100 mg total) by mouth every 8 (eight) hours. What changed:   medication strength  how much to take  Another medication with the same name was removed. Continue taking this medication, and follow the directions you see here.   isosorbide mononitrate 60 MG 24 hr tablet Commonly known as: IMDUR Take 1 tablet (60 mg total) by mouth daily.   Januvia 25 MG tablet Generic drug: sitaGLIPtin Take 1 tablet (25 mg total) by mouth daily.   labetalol 200 MG tablet Commonly known as: NORMODYNE Take 400 mg by mouth 3 (three) times daily.   LORazepam 1 MG tablet Commonly known as: ATIVAN Take 1 mg by mouth every 8 (eight) hours as needed for anxiety.   montelukast 10 MG tablet Commonly known as: SINGULAIR Take 10 mg by mouth daily with supper.   multivitamin Tabs tablet Take 1 tablet by mouth daily with supper.   olmesartan 40 MG tablet Commonly  known as: BENICAR Take 40 mg by mouth every evening.   oxyCODONE 15 MG immediate release tablet Commonly known as: ROXICODONE Take 15 mg by mouth 3 (three) times daily as needed for pain.   pantoprazole 40 MG tablet Commonly known as: PROTONIX Take 40 mg by mouth daily.   predniSONE 5 MG tablet Commonly known as: DELTASONE Take 1 tablet (5 mg total) by mouth daily with breakfast.   PROBIOTIC PO Take 1 capsule by mouth daily.   SPIRIVA RESPIMAT IN Inhale 2 puffs into the lungs daily.   Ventolin HFA 108 (90 Base) MCG/ACT inhaler Generic drug: albuterol Inhale 1-2 puffs into the lungs every 6 (six) hours as needed for shortness of breath. wheezing   albuterol (2.5 MG/3ML) 0.083% nebulizer solution Commonly known as: PROVENTIL Take 2.5 mg by nebulization as needed for wheezing or shortness of breath.   Vitamin D3 10 MCG (400 UNIT) tablet Take 400 Units by mouth every evening.   Xeljanz 5 MG Tabs Generic drug: Tofacitinib Citrate Take 5 mg by mouth at bedtime.   zolpidem 5 MG tablet Commonly known as: AMBIEN Take 5 mg by mouth at bedtime.            Durable Medical Equipment  (From admission, onward)         Start     Ordered   07/06/20 1449  For home use only DME 4 wheeled rolling walker with seat  Once       Question:  Patient needs a walker to treat with the following condition  Answer:  Weakness   07/06/20 1448          Discharge Instructions: Please refer to Patient Instructions section of EMR for full details.  Patient was counseled important signs and symptoms  that should prompt return to medical care, changes in medications, dietary instructions, activity restrictions, and follow up appointments.   Follow-Up Appointments:  Follow-up Information    Care, [facility] Follow up.   Specialty: [place] Why: A representative from [facility] will contact you to arrange start date and time for your therapy. Contact  information: [place] [address] [phone]        [name]., MD. Schedule an appointment as soon as possible for a visit.   Specialty: Internal Medicine Contact information: [id number] [place] [address] [phone]               Delora Fuel, MD 07/06/2020, 9:08 PM PGY-2, [place]

## 2020-07-05 NOTE — TOC Transition Note (Signed)
Transition of Care [facility]) - CM/SW Discharge Note   Patient Details  Name: Jodi Bell MRN: [id number] Date of Birth: [birth year]  Transition of Care [employer]) CM/SW Contact:  Ninfa Meeker, RN Phone Number: 07/05/2020, 11:02 AM   Clinical Narrative:   Case manager spoke with patient concerning discharge plan. Patient lives with her daughter and son-in-law. CM discussed choices for [place], patient says she has used [facility] and wishes to do so now. Referral called to [place], Liaison for [facility]. No DME needs.   Final next level of care: Home w Home Health Services Barriers to Discharge: No Barriers Identified   Patient Goals and CMS Choice Patient states their goals for this hospitalization and ongoing recovery are:: go home   Choice offered to / list presented to : Patient  Discharge Placement                       Discharge Plan and Services   Discharge Planning Services: CM Consult Post Acute Care Choice: Home Health          DME Arranged: N/A DME Agency: NA       HH Arranged: PT HH Agency: [place] Date [facility] Agency Contacted: 07/05/20 Time [place]: 1101 Representative spoke with at [place]: [place] Determinants of Health ([place]) Interventions     Readmission Risk Interventions No flowsheet data found.

## 2020-07-05 NOTE — Progress Notes (Addendum)
FPTS Interim Progress Note  S: Received a page from RN about patient sleeping and found on floor. Patient evaluated at bedside immediately.  She states that she was sleeping on her chair when she heard some noise outside, and that scared her and she tried to get up from her chair and her foot just slipped.  Did not hit the floor but when had balance issue just slipped herself voluntarily to the floor and sat there until found by RN.  Denies hitting her head or intending of her foot just sat quickly on her back.  Of note while conversation informed that her blood pressure medications are managed by her nephrology team at HD not by her internal medicine physician  O: BP (!) 179/81 (BP Location: Right Arm)   Pulse 79   Temp 98.3 F (36.8 C) (Oral)   Resp 18   Wt 151 lb 0.2 oz (68.5 kg)   SpO2 94%   BMI 27.62 kg/m     Patient is in expected thoroughly at her back, hips and legs.  No redness, no swelling or bruise noted.  No broken bone felt.  Multiple exercises performed with elevation flexion extension of bilateral lower extremities.  Did not notice any flinching. --Patient is advised to sleep in bed and ask for help when needed to get up from bed and transfer to chair. -- No other intervention needed at this time.   Honor Junes, MD 07/05/2020, 3:30 PM PGY-1 , [place] Medicine Service pager [phone]

## 2020-07-05 NOTE — Progress Notes (Signed)
Alarm went off from chair, patient found on the edge of chair sitting on the floor by Eliberto Ivory with  [facility] RN and  Patient noted to be alert, oriented x 4 ,and denies of any pain. Assessed the patient with no complaints of discomforts. Able to stand up and transferred back in bed placed in low fowlers position. Vital signs stable. Denies any discomforts or pain. Family notified and aware.

## 2020-07-05 NOTE — Progress Notes (Addendum)
Family Medicine Teaching Service Daily Progress Note Intern Pager: [phone]  Patient name: Jodi Bell Medical record number: [id number] Date of birth: 05/14/1960 Age: 60 y.o. Gender: female  Primary Care Provider: [name]., MD Consultants: Nephrology Code Status: Full  Pt Overview and Major Events to Date:  07/04/2020: Admitted to FPTS  Assessment and Plan: Jodi Bell is a 60 y.o. female presenting with weakness and uncontrolled hypertension. 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    Weakness-Improving I Hypoglycemia Patient is improving and feels better this morning.  CBG 89 this morning, improved from 40s on arrival s/p D10 bolus. Afebrile. WBC-2.6.  Waiting for PT and OT recommendations. -- Vital signs q4hr -- CBG TID -- Renal diet -- PT eval and treat --OT eval and treat  Low AM Cortisol I ? Adrenal insufficiency due to prednisone withdrawal Patient's am cortisol is 3.1, random glucose 4.1, normal Complains of fatigue, weakness, chronic nausea in past.  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  On chart review 03/13/2020 orders only visit shows prednisone 5 mg daily 30 tablets with 5 refills start date 03/13/2020- 05/22/2020. -- Start prednisone 10 mg for 2 days and then 5 mg  Rheumatoid Arthritis  Psoriatic Arthritis  On Xeljanz 5 mg daily.  Patient was also taking 5 mg prednisone, ran out of it 2 weeks ago.   -- Hold Xeljanz  Uncontrolled Hypertension BP's 171-200/71-85.Cardiology patient's hold home regimen. Home meds: clonidine 0.3 mg  3 times daily, Lasix 40 mg twice daily, hydralazine 100 mg 3 times daily, Imdur 60 mg daily, labetalol 400 mg 3 times daily, olmesartan 40 mg daily, norvasc 10 mg.  Cardiology consulted, awaiting their recommendation --c/w Norvasc 10 mg, clonidine 0.3 mg, hydralazine 100 mg TID, irbesartan 300 mg,  Imdur 60 mg, Lasix 40 mg BID, labetalol 400 mg TID -- Appreciate cardiology assistance   ESRD on MWF HD Following and patient had HD yesterday and it was truncated to get her back on her schedule.  Dry weight is 73 kg. No edema appreciated in lower extremities. -- Nephro following, appreciate recs  Hypokalemia K+ 3.4 -- Recieved 1 dose of 40 mg KCl  Anemia of chronic disease --No ESA indicated at this time as per nephrology --s/p recent Aranesp dose on [id number]  Metabolic bone disease Home meds: Zemplar 6 MCG H treatment outpatient and on Sensipar 30 mg MWF at home.  [facility] Sensipar 30 mg every day but per patient it has been reduced orally for MWF -- Continue with same plan  Chronic Nausea Patient takes Zofran at home.  Holding in setting of Prolonged QTc 517 --May consider restarting if nausea develops  COPD/Asthma  Home meds:  Spiriva 2 puffs BID, Singulair daily, and Albuterol as needed.   Patient breathing normally on room air --c/w albuterol, guanfacine 200 mg  Type 2 DM Last A1C was 5.6 on 02/17/20.  On Januvia 25 mg daily.  - Hold Januvia in setting of Hypoglycemia  Anxiety Takes Ativan as needed for anxiety. - Hold Ativan  Insomnia Receives Ambien nightly - c/w Ambien  Tobacco Use Currently smoking  - d/c nicotine patch   FEN/GI: Renal diet with fluid restriction PPx: Sub Q Heparin   Status is: Inpatient  The patient will require care spanning > 2 midnights and should be moved to inpatient because: Ongoing diagnostic testing needed not appropriate for  outpatient work up  [employer]: The patient is from: Home              Anticipated d/c is to: Home              Patient currently is not medically stable to d/c.   Difficult to place patient No        Subjective:  No acute overnight events.  Patient evaluated at bedside this morning.  She denies any complaints today except feeling weak and states looking forward to eat her breakfast.  Explained  about her discharge plan delayed due to her high blood pressure and states she is okay to stay.  No other complaints at this time  Objective: Temp:  [98.1 F (36.7 C)-99.1 F (37.3 C)] 98.2 F (36.8 C) (05/26 0509) Pulse Rate:  [78-84] 80 (05/26 0627) Resp:  [15-19] 15 (05/26 0509) BP: (171-200)/(71-85) 176/78 (05/26 0627) SpO2:  [86 %-95 %] 86 % (05/26 0509) Weight:  [151 lb 0.2 oz (68.5 kg)-154 lb 5.2 oz (70 kg)] 151 lb 0.2 oz (68.5 kg) (05/25 1805) Physical Exam: General: Pleasant lady sitting comfortably in bed, NAD Cardiovascular: Regular rate and rhythm,+ murmur Respiratory: Patient breathing comfortably on room air Abdomen: Nondistended, nontender, bowel sounds present Extremities: No Edema appreciated Laboratory: Recent Labs  Lab 07/03/20 1025 07/04/20 0405 07/05/20 0605  WBC 3.6* 2.7* 2.6*  HGB 11.0* 10.4* 11.0*  HCT 35.6* 32.3* 33.4*  PLT 200 194 193   Recent Labs  Lab 06/28/20 2119 07/03/20 1025 07/04/20 0405 07/05/20 0605  NA  --  130* 134* 131*  K  --  4.9 3.8 3.4*  CL  --  96* 96* 92*  CO2  --  20* 30 29  BUN  --  22* 7 <5*  CREATININE  --  11.17* 6.01* 4.65*  CALCIUM  --  8.4* 8.7* 9.1  PROT 5.7* 5.8*  --   --   BILITOT 0.7 1.4*  --   --   ALKPHOS 89 91  --   --   ALT 7 9  --   --   AST 15 17  --   --   GLUCOSE  --  47* 86 110*    Imaging/Diagnostic Tests:No results found.   Honor Junes, MD 07/05/2020, 9:23 AM PGY-1, [place] Intern pager: [phone], text pages welcome

## 2020-07-05 NOTE — Plan of Care (Signed)
  Problem: Health Behavior/Discharge Planning: Goal: Ability to manage health-related needs will improve Outcome: Progressing   Problem: Activity: Goal: Risk for activity intolerance will decrease Outcome: Progressing   Problem: Nutrition: Goal: Adequate nutrition will be maintained Outcome: Progressing   Problem: Pain Managment: Goal: General experience of comfort will improve Outcome: Progressing   

## 2020-07-05 NOTE — Hospital Course (Addendum)
Jodi Bell is a 60 y.o. female presenting with weakness. 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       Weakness I Hypoglycemia Patient began experienceing weakness on 5/25.  On arrival patient's blood sugars in 40's.  Improved with D10 bolus. VSS.  Thought likely due to hypoglycemia in setting of Viral URI given symptoms.  Afebrile. WBC-3.6.  Less likely COPD exacerbtion given minimal sputum production.   Unlikely PE given Wells score of 0.  Last ECHO in 2019, showed Grade 1 Diastolic Dysfuction with mildl septal hypertrophy. Cardiomegaly on exam and suspected cardiomegaly on chest X-Ray. Patient approved during hospitalization.   Low AM Cortisol I ? Adrenal insufficiency with prednisone withdrawal Patient's am cortisol is 3.1, random glucose 4.1, normal Complains of fatigue, weakness, chronic nausea in past.  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  On chart review 03/13/2020 orders only visit shows prednisone 5 mg daily 30 tablets with 5 refills start date 03/13/2020- 05/22/2020. Started on prednisone 10 mg for 2 days and then c/w 5 mg   ESRD on MWF HD Nephrology followed in the hospital.  Received HD on 5/24  then 5/25 as was not able to make her outpatient appointments 1 time due to weakness.   Uncontrolled Hypertension BP's 171-200/71-85.Cardiology patient's hold home regimen. Home meds: clonidine 0.3 mg  3 times daily, Lasix 40 mg twice daily, hydralazine 100 mg 3 times daily, Imdur 60 mg daily, labetalol 400 mg 3 times daily, olmesartan 40 mg daily, norvasc 10 mg.  Cardiology consulted, awaiting their recommendation --c/w Norvasc 10 mg, clonidine 0.3 mg, hydralazine 100 mg TID, irbesartan 300 mg, Imdur 60 mg, Lasix 40 mg BID, labetalol 400 mg TID   All other chronic conditions stable.

## 2020-07-05 NOTE — Plan of Care (Signed)
  Problem: Activity: Goal: Risk for activity intolerance will decrease Outcome: Progressing   

## 2020-07-05 NOTE — Progress Notes (Signed)
Physical Therapy Treatment Patient Details Name: Jodi Bell MRN: [id number] DOB: [birth year] Today's Date: 07/05/2020    History of Present Illness 60 year old female presents via EMS 5/24 c/o weakness and light headed after missing dialysis day prior.  Blood sugar in 40s Admitted for observation. 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    PT Comments    Pt dozing with feet off EoB. Pt reports feeling very tired today. Hopeful for discharge home today however BP is being monitored. BP prior to ambulation 167/77. Pt independent for bed mobility, and mod I for transfers and ambulation. Pt ambulation noted to start strong however velocity decreased with distance. With return to room BP 169/74. Pt lunch had arrived and pt declined additional exercise. D/c plans remain appropriate at this time.      Follow Up Recommendations  Home health PT;Supervision - Intermittent     Equipment Recommendations  Other (comment) (Rollator)       Precautions / Restrictions Precautions Precautions: None Restrictions Weight Bearing Restrictions: No    Mobility  Bed Mobility Overal bed mobility: Independent                  Transfers Overall transfer level: Modified independent Equipment used: Rolling walker (2 wheeled)             General transfer comment: good power up and self steadying at [employer]  Ambulation/Gait Ambulation/Gait assistance: Supervision Gait Distance (Feet): 60 Feet Assistive device: Rolling walker (2 wheeled) Gait Pattern/deviations: Step-through pattern;Decreased step length - right;Decreased step length - left Gait velocity: slowed Gait velocity interpretation: <1.8 ft/sec, indicate of risk for recurrent falls General Gait Details: supervision for slow, steady gait with RW, vc for proximity to RW         Balance Overall balance assessment: Mild deficits observed, not formally tested                                           Cognition Arousal/Alertness: Awake/alert Behavior During Therapy: WFL for tasks assessed/performed Overall Cognitive Status: Within Functional Limits for tasks assessed                                           General Comments General comments (skin integrity, edema, etc.): BP prior to ambulation 167/77 after ambulation 139/74      Pertinent Vitals/Pain Pain Assessment: No/denies pain           PT Goals (current goals can now be found in the care plan section) Acute Rehab PT Goals Patient Stated Goal: get strength back PT Goal Formulation: With patient Time For Goal Achievement: 07/18/20 Potential to Achieve Goals: Good Progress towards PT goals: Progressing toward goals    Frequency    Min 3X/week      PT Plan Current plan remains appropriate       AM-PAC PT "6 Clicks" Mobility   Outcome Measure  Help needed turning from your back to your side while in a flat bed without using bedrails?: None Help needed moving from lying on your back to sitting on the side of a flat bed without using bedrails?: None Help needed moving to and from a bed to a chair (including a wheelchair)?: None Help needed standing up  from a chair using your arms (e.g., wheelchair or bedside chair)?: None Help needed to walk in hospital room?: None Help needed climbing 3-5 steps with a railing? : A Little 6 Click Score: 23    End of Session Equipment Utilized During Treatment: Gait belt Activity Tolerance: Patient tolerated treatment well Patient left: in chair;with call bell/phone within reach;with chair alarm set Nurse Communication: Mobility status PT Visit Diagnosis: Muscle weakness (generalized) (M62.81)     Time: [id number] PT Time Calculation (min) (ACUTE ONLY): 16 min  Charges:  $Therapeutic Exercise: 8-22 mins                     Kayhan Boardley B. Migdalia Dk PT, DPT Acute Rehabilitation Services Pager [phone] Office [phone]    [place] 07/05/2020, 12:55 PM

## 2020-07-06 ENCOUNTER — Other Ambulatory Visit (HOSPITAL_COMMUNITY): Payer: Self-pay

## 2020-07-06 DIAGNOSIS — R0600 Dyspnea, unspecified: Secondary | ICD-10-CM | POA: Diagnosis not present

## 2020-07-06 DIAGNOSIS — I10 Essential (primary) hypertension: Secondary | ICD-10-CM

## 2020-07-06 DIAGNOSIS — N186 End stage renal disease: Secondary | ICD-10-CM | POA: Diagnosis not present

## 2020-07-06 LAB — BASIC METABOLIC PANEL
Anion gap: 6 (ref 5–15)
BUN: 6 mg/dL (ref 6–20)
CO2: 28 mmol/L (ref 22–32)
Calcium: 9.4 mg/dL (ref 8.9–10.3)
Chloride: 96 mmol/L — ABNORMAL LOW (ref 98–111)
Creatinine, Ser: 6.25 mg/dL — ABNORMAL HIGH (ref 0.44–1.00)
GFR, Estimated: 7 mL/min — ABNORMAL LOW (ref >60)
Glucose, Bld: 126 mg/dL — ABNORMAL HIGH (ref 70–99)
Potassium: 4.8 mmol/L (ref 3.5–5.1)
Sodium: 130 mmol/L — ABNORMAL LOW (ref 135–145)

## 2020-07-06 LAB — CBC
HCT: 33.3 % — ABNORMAL LOW (ref 36.0–46.0)
Hemoglobin: 10.9 g/dL — ABNORMAL LOW (ref 12.0–15.0)
MCH: 31.6 pg (ref 26.0–34.0)
MCHC: 32.7 g/dL (ref 30.0–36.0)
MCV: 96.5 fL (ref 80.0–100.0)
Platelets: 225 10*3/uL (ref 150–400)
RBC: 3.45 MIL/uL — ABNORMAL LOW (ref 3.87–5.11)
RDW: 15.5 % (ref 11.5–15.5)
WBC: 1.7 10*3/uL — ABNORMAL LOW (ref 4.0–10.5)
nRBC: 0 % (ref 0.0–0.2)

## 2020-07-06 LAB — GLUCOSE, CAPILLARY
Glucose-Capillary: 110 mg/dL — ABNORMAL HIGH (ref 70–99)
Glucose-Capillary: 150 mg/dL — ABNORMAL HIGH (ref 70–99)
Glucose-Capillary: 135 mg/dL — ABNORMAL HIGH (ref 70–99)
Glucose-Capillary: 164 mg/dL — ABNORMAL HIGH (ref 70–99)

## 2020-07-06 MED ORDER — CINACALCET HCL 30 MG PO TABS
30.0000 mg | ORAL_TABLET | ORAL | Status: DC
  Administered 2020-07-06: 30 mg via ORAL
  Filled 2020-07-06: qty 1

## 2020-07-06 MED ORDER — PREDNISONE 5 MG PO TABS
5.0000 mg | ORAL_TABLET | Freq: Every day | ORAL | 0 refills | Status: DC
  Filled 2020-07-06: qty 30, 30d supply, fill #0

## 2020-07-06 MED ORDER — JANUVIA 25 MG PO TABS
25.0000 mg | ORAL_TABLET | Freq: Every day | ORAL | 0 refills | Status: DC
  Filled 2020-07-06: qty 30, 30d supply, fill #0

## 2020-07-06 MED ORDER — HYDRALAZINE HCL 100 MG PO TABS
100.0000 mg | ORAL_TABLET | Freq: Three times a day (TID) | ORAL | 0 refills | Status: DC
  Filled 2020-07-06: qty 90, 30d supply, fill #0

## 2020-07-06 MED ORDER — FUROSEMIDE 40 MG PO TABS
40.0000 mg | ORAL_TABLET | Freq: Two times a day (BID) | ORAL | Status: DC
  Administered 2020-07-06: 40 mg via ORAL
  Filled 2020-07-06: qty 1

## 2020-07-06 NOTE — Plan of Care (Signed)
  Problem: Education: Goal: Knowledge of General Education information will improve Description: Including pain rating scale, medication(s)/side effects and non-pharmacologic comfort measures Outcome: Adequate for Discharge   Problem: Health Behavior/Discharge Planning: Goal: Ability to manage health-related needs will improve Outcome: Adequate for Discharge   Problem: Clinical Measurements: Goal: Ability to maintain clinical measurements within normal limits will improve Outcome: Adequate for Discharge Goal: Will remain free from infection Outcome: Adequate for Discharge Goal: Diagnostic test results will improve Outcome: Adequate for Discharge Goal: Respiratory complications will improve Outcome: Adequate for Discharge Goal: Cardiovascular complication will be avoided Outcome: Adequate for Discharge   Problem: Activity: Goal: Risk for activity intolerance will decrease Outcome: Adequate for Discharge   Problem: Nutrition: Goal: Adequate nutrition will be maintained Outcome: Adequate for Discharge   Problem: Coping: Goal: Level of anxiety will decrease Outcome: Adequate for Discharge   Problem: Elimination: Goal: Will not experience complications related to bowel motility Outcome: Adequate for Discharge Goal: Will not experience complications related to urinary retention Outcome: Adequate for Discharge   Problem: Pain Managment: Goal: General experience of comfort will improve Outcome: Adequate for Discharge   Problem: Safety: Goal: Ability to remain free from injury will improve Outcome: Adequate for Discharge   Problem: Skin Integrity: Goal: Risk for impaired skin integrity will decrease Outcome: Adequate for Discharge   Problem: Acute Rehab PT Goals(only PT should resolve) Goal: Patient Will Transfer Sit To/From Stand Outcome: Adequate for Discharge Goal: Pt Will Ambulate Outcome: Adequate for Discharge Goal: Pt Will Go Up/Down Stairs Outcome: Adequate for  Discharge   Problem: Inadequate Intake (NI-2.1) Goal: Food and/or nutrient delivery Description: Individualized approach for food/nutrient provision. Outcome: Adequate for Discharge   Problem: Fluid Volume: Goal: Compliance with measures to maintain balanced fluid volume will improve 07/06/2020 1718 by [name], RN Outcome: Adequate for Discharge 07/06/2020 0743 by [name], RN Outcome: Progressing   Problem: Health Behavior/Discharge Planning: Goal: Ability to manage health-related needs will improve 07/06/2020 1718 by [name], RN Outcome: Adequate for Discharge 07/06/2020 0743 by [name], RN Outcome: Progressing   Problem: Nutritional: Goal: Ability to make healthy dietary choices will improve 07/06/2020 1718 by [name], RN Outcome: Adequate for Discharge 07/06/2020 0743 by [name], RN Outcome: Progressing

## 2020-07-06 NOTE — Plan of Care (Signed)
  Problem: Nutrition: Goal: Adequate nutrition will be maintained Outcome: Progressing   

## 2020-07-06 NOTE — Plan of Care (Signed)
  Problem: Fluid Volume: Goal: Compliance with measures to maintain balanced fluid volume will improve Outcome: Progressing   Problem: Health Behavior/Discharge Planning: Goal: Ability to manage health-related needs will improve Outcome: Progressing   Problem: Nutritional: Goal: Ability to make healthy dietary choices will improve Outcome: Progressing

## 2020-07-06 NOTE — Progress Notes (Addendum)
[facility] Progress Note  Name: Jodi Bell MRN: [id number] DOB: [birth year]  Chief Complaint:  Shortness of breath  Subjective:  had HD on 5/25 with 1.5 kg UF (early evening).  She thinks she is going home today.  States she is on lasix 40 mg BID at home.  Review of systems:   Denies n/v No chest pain Denies shortness of breath.  ---- Background on consult  Jodi Bell is a 60 y.o. female with a history of ESRD on HD through [facility], HTN, DM, obesity and OSA who presented to the ER with shortness of breath.  Shortness of breath has gotten worse since she's been here and she has been coughing more.  She did also have n/v yesterday.  She was recently diagnosed with influenza last Thursday.  She is at [facility] unit through [place].  Called her outpatient HD unit for orders below.  Missed 5/18 as well - before that has been doing well with attending for several months per nursing.  The patient states that she was too weak to get ready and she states was also found to have low blood sugar.   Intake/Output Summary (Last 24 hours) at 07/06/2020 1021 Last data filed at 07/06/2020 0800 Gross per 24 hour  Intake 600 ml  Output 0 ml  Net 600 ml    Vitals:  Vitals:   07/05/20 2134 07/06/20 0531 07/06/20 0535 07/06/20 0855  BP: (!) 172/84 (!) 179/78  (!) 169/74  Pulse: 83 78  79  Resp: '16 16  20  '$ Temp: 98.8 F (37.1 C) 98.2 F (36.8 C)  98.3 F (36.8 C)  TempSrc: Oral Oral  Oral  SpO2: 96% 93%  93%  Weight:   71 kg      Physical Exam:  General: adult female in bed in NAD at rest HEENT: NCAT Eyes: EOMI sclera anicteric Neck: supple trachea midline Heart:S1S2 no rub Lungs: clear to auscultation; unlabored on room air Abdomen:  Soft/NT/ND Extremities: no pitting edema appreciated; no cyanosis or clubbing Skin: no rash on extremities exposed Neuro: alert and oriented x 3 provides hx and follows commands Access - LUE AVF thrill and  bruit   Medications reviewed   Labs:  BMP Latest Ref Rng & Units 07/06/2020 07/05/2020 07/04/2020  Glucose 70 - 99 mg/dL 126(H) 110(H) 86  BUN 6 - 20 mg/dL 6 <5(L) 7  Creatinine 0.44 - 1.00 mg/dL 6.25(H) 4.65(H) 6.01(H)  Sodium 135 - 145 mmol/L 130(L) 131(L) 134(L)  Potassium 3.5 - 5.1 mmol/L 4.8 3.4(L) 3.8  Chloride 98 - 111 mmol/L 96(L) 92(L) 96(L)  CO2 22 - 32 mmol/L '28 29 30  '$ Calcium 8.9 - 10.3 mg/dL 9.4 9.1 8.7(L)    Outpatient HD orders:  [facility] MWF Left arm AVF EDW 72.5 kg  3.5 hours Didn't come 5/23; last HD was 5/20 with post weight 72.5 kg  2/2.5 Ca  Heparin 3200 bolus and 500 hourly aranesp 85 mcg once weekly last dose on 5/20 venofer 50 mg weekly hectorol out - now on zemplar 6 mcg each treatment Normally between 1.5 - 2.5 kg between treatment   Assessment/Plan:   # ESRD - HD per MWF schedule - HD here today - she tells me she is on lasix 40 mg BID (daily not just on non-HD days) - continue her established regimen  - anticipate lower EDW  # Flu positive - per primary team - optimize volume status with HD   # HTN  -  Continue home regimen per team and optimize volume status with HD   # Anemia CKD  - s/p recent aranesp dose on 5/20 - no ESA indicated at this time   # Metabolic bone disease - zemplar 6 mcg each tx outpatient and on sensipar 30 mg MWF at home (not daily as on bottle - per pt has been reduced)  Disposition per primary team. Stable from a renal standpoint.   Claudia Desanctis, MD 07/06/2020 10:32 AM

## 2020-07-06 NOTE — Progress Notes (Signed)
Renal Navigator faxed discharge information to patient's outpatient HD clinic/[facility] to provide continuity of care.   Alphonzo Cruise, [place] Renal Navigator [phone]

## 2020-07-06 NOTE — Progress Notes (Signed)
DISCHARGE NOTE HOME Jodi Bell to be discharged Home per MD order. Discussed prescriptions and follow up appointments with the patient. Prescriptions given to patient; medication list explained in detail. Patient verbalized understanding.  Skin clean, dry and intact without evidence of skin break down, no evidence of skin tears noted. IV catheter discontinued intact. Site without signs and symptoms of complications. Dressing and pressure applied. Pt denies pain at the site currently. No complaints noted.  Patient free of lines, drains, and wounds.   An After Visit Summary (AVS) was printed and given to the patient. Patient escorted via wheelchair, and discharged home via private auto.  [name], RN

## 2020-08-22 ENCOUNTER — Emergency Department (HOSPITAL_COMMUNITY)
Admission: EM | Admit: 2020-08-22 | Discharge: 2020-08-23 | Disposition: A | Discharge status: Home / Self Care | Payer: Medicare PPO | Attending: Emergency Medicine | Admitting: Emergency Medicine

## 2020-08-22 ENCOUNTER — Emergency Department (HOSPITAL_COMMUNITY): Payer: Medicare PPO

## 2020-08-22 ENCOUNTER — Other Ambulatory Visit: Payer: Self-pay

## 2020-08-22 DIAGNOSIS — F17210 Nicotine dependence, cigarettes, uncomplicated: Secondary | ICD-10-CM | POA: Diagnosis not present

## 2020-08-22 DIAGNOSIS — Z96652 Presence of left artificial knee joint: Secondary | ICD-10-CM | POA: Insufficient documentation

## 2020-08-22 DIAGNOSIS — M542 Cervicalgia: Secondary | ICD-10-CM | POA: Insufficient documentation

## 2020-08-22 DIAGNOSIS — I120 Hypertensive chronic kidney disease with stage 5 chronic kidney disease or end stage renal disease: Secondary | ICD-10-CM | POA: Insufficient documentation

## 2020-08-22 DIAGNOSIS — I509 Heart failure, unspecified: Secondary | ICD-10-CM

## 2020-08-22 DIAGNOSIS — R0602 Shortness of breath: Secondary | ICD-10-CM | POA: Diagnosis present

## 2020-08-22 DIAGNOSIS — J449 Chronic obstructive pulmonary disease, unspecified: Secondary | ICD-10-CM | POA: Insufficient documentation

## 2020-08-22 DIAGNOSIS — Z992 Dependence on renal dialysis: Secondary | ICD-10-CM | POA: Diagnosis not present

## 2020-08-22 DIAGNOSIS — Z20822 Contact with and (suspected) exposure to covid-19: Secondary | ICD-10-CM | POA: Insufficient documentation

## 2020-08-22 DIAGNOSIS — J45909 Unspecified asthma, uncomplicated: Secondary | ICD-10-CM | POA: Insufficient documentation

## 2020-08-22 DIAGNOSIS — Z79899 Other long term (current) drug therapy: Secondary | ICD-10-CM | POA: Diagnosis not present

## 2020-08-22 DIAGNOSIS — N186 End stage renal disease: Secondary | ICD-10-CM | POA: Diagnosis not present

## 2020-08-22 DIAGNOSIS — Z7951 Long term (current) use of inhaled steroids: Secondary | ICD-10-CM | POA: Insufficient documentation

## 2020-08-22 DIAGNOSIS — E119 Type 2 diabetes mellitus without complications: Secondary | ICD-10-CM | POA: Diagnosis not present

## 2020-08-22 DIAGNOSIS — I10 Essential (primary) hypertension: Secondary | ICD-10-CM

## 2020-08-22 LAB — CBC WITH DIFFERENTIAL/PLATELET
Abs Immature Granulocytes: 0.01 10*3/uL (ref 0.00–0.07)
Basophils Absolute: 0 10*3/uL (ref 0.0–0.1)
Basophils Relative: 1 %
Eosinophils Absolute: 0.2 10*3/uL (ref 0.0–0.5)
Eosinophils Relative: 5 %
HCT: 30.5 % — ABNORMAL LOW (ref 36.0–46.0)
Hemoglobin: 9.7 g/dL — ABNORMAL LOW (ref 12.0–15.0)
Immature Granulocytes: 0 %
Lymphocytes Relative: 19 %
Lymphs Abs: 0.8 10*3/uL (ref 0.7–4.0)
MCH: 33.2 pg (ref 26.0–34.0)
MCHC: 31.8 g/dL (ref 30.0–36.0)
MCV: 104.5 fL — ABNORMAL HIGH (ref 80.0–100.0)
Monocytes Absolute: 0.6 10*3/uL (ref 0.1–1.0)
Monocytes Relative: 13 %
Neutro Abs: 2.6 10*3/uL (ref 1.7–7.7)
Neutrophils Relative %: 62 %
Platelets: 153 10*3/uL (ref 150–400)
RBC: 2.92 MIL/uL — ABNORMAL LOW (ref 3.87–5.11)
RDW: 15.3 % (ref 11.5–15.5)
WBC: 4.2 10*3/uL (ref 4.0–10.5)
nRBC: 0 % (ref 0.0–0.2)

## 2020-08-22 LAB — COMPREHENSIVE METABOLIC PANEL
ALT: 12 U/L (ref 0–44)
AST: 16 U/L (ref 15–41)
Albumin: 2.9 g/dL — ABNORMAL LOW (ref 3.5–5.0)
Alkaline Phosphatase: 67 U/L (ref 38–126)
Anion gap: 7 (ref 5–15)
BUN: 7 mg/dL (ref 6–20)
CO2: 32 mmol/L (ref 22–32)
Calcium: 10.1 mg/dL (ref 8.9–10.3)
Chloride: 99 mmol/L (ref 98–111)
Creatinine, Ser: 3.14 mg/dL — ABNORMAL HIGH (ref 0.44–1.00)
GFR, Estimated: 16 mL/min — ABNORMAL LOW (ref >60)
Glucose, Bld: 72 mg/dL (ref 70–99)
Potassium: 4.3 mmol/L (ref 3.5–5.1)
Sodium: 138 mmol/L (ref 135–145)
Total Bilirubin: 0.7 mg/dL (ref 0.3–1.2)
Total Protein: 6.1 g/dL — ABNORMAL LOW (ref 6.5–8.1)

## 2020-08-22 LAB — TROPONIN I (HIGH SENSITIVITY): Troponin I (High Sensitivity): 19 ng/L — ABNORMAL HIGH (ref <18)

## 2020-08-22 MED ORDER — ACETAMINOPHEN 325 MG PO TABS
650.0000 mg | ORAL_TABLET | Freq: Once | ORAL | Status: AC
  Administered 2020-08-22: 650 mg via ORAL
  Filled 2020-08-22: qty 2

## 2020-08-22 MED ORDER — IPRATROPIUM-ALBUTEROL 0.5-2.5 (3) MG/3ML IN SOLN
3.0000 mL | Freq: Once | RESPIRATORY_TRACT | Status: AC
  Administered 2020-08-22: 3 mL via RESPIRATORY_TRACT
  Filled 2020-08-22: qty 3

## 2020-08-22 MED ORDER — LABETALOL HCL 200 MG PO TABS
200.0000 mg | ORAL_TABLET | Freq: Once | ORAL | Status: AC
  Administered 2020-08-22: 200 mg via ORAL
  Filled 2020-08-22: qty 1

## 2020-08-22 MED ORDER — HYDRALAZINE HCL 25 MG PO TABS
100.0000 mg | ORAL_TABLET | Freq: Once | ORAL | Status: AC
  Administered 2020-08-22: 100 mg via ORAL
  Filled 2020-08-22: qty 4

## 2020-08-22 MED ORDER — CLONIDINE HCL 0.2 MG PO TABS
0.3000 mg | ORAL_TABLET | Freq: Once | ORAL | Status: AC
  Administered 2020-08-22: 0.3 mg via ORAL
  Filled 2020-08-22: qty 1

## 2020-08-22 NOTE — ED Notes (Addendum)
PT stated that she felt like she couldn't breathe. Pt O2 was at 98%,  Pt was placed on 2liters via nasal cannula for comfort.

## 2020-08-22 NOTE — ED Provider Notes (Signed)
[facility] EMERGENCY DEPARTMENT Provider Note   CSN: [id number] Arrival date & time: 08/22/20  1810     History Chief Complaint  Patient presents with  . Shortness of Breath    Pt  c/o SOB that started this morning. Pt also c/o headache that's been ongoing for past month.     Jodi Bell is a 60 y.o. female with a past medical history of ESRD on dialysis last completed today.  Hypertension, GERD presenting to the ED for shortness of breath.  Reports chest pain since yesterday that is been intermittent.  2 nitroglycerin yesterday but unsure if this caused improvement.  Has been feeling short of breath today.  Temp of 100 that she noticed today.  This all occurred after her dialysis session which she completed without issue.  Has been having neck pain "for ages."  Had neck surgery about a year ago for possible fracture?  The neck pain is not new.  Will intermittently radiate to her ear.  Denies any vomiting, diarrhea, abdominal pain.  HPI     Past Medical History:  Diagnosis Date  . Anemia of chronic disease   . Anxiety   . Aortic stenosis   . Arthritis    oa and psoriatic ra  . Asthma   . Back pain, chronic    lower back  . Candida infection finished tx 2 days ago  . Chronic insomnia   . COPD (chronic obstructive pulmonary disease) ([place])   . Coronary artery calcification seen on CT scan   . Diabetes mellitus    type 2  . Eczema   . ESRD on dialysis ([place])   . Essential hypertension   . GERD (gastroesophageal reflux disease)   . Gout   . History of hiatal hernia   . Hypoalbuminemia   . Hyponatremia   . Mitral stenosis   . Obesity   . PAD (peripheral artery disease) ([facility])    a. externial iliac calcification seen on CT 04/2020.  Marland Kitchen Pneumonia few yrs ago x 2  . Sleep apnea   . Tobacco abuse   . Upper GI bleed 05/2019    Patient Active Problem List   Diagnosis Date Noted  . Dyspnea 07/03/2020  . GI bleeding 05/17/2019  . Volume overload state  of heart 09/14/2018  . Intervertebral cervical disc disorder with myelopathy, cervical region 09/14/2018  . OSA (obstructive sleep apnea) 09/14/2018  . Asthma 05/16/2018  . Suspected COVID-19 virus infection 05/16/2018  . ESRD (end stage renal disease) ([place]) 05/16/2018  . Fluid overload 02/27/2018  . Chest pain, rule out acute myocardial infarction 12/12/2017  . Status post left partial knee replacement 11/12/2017  . Hematochezia 09/20/2017  . GERD (gastroesophageal reflux disease) 09/20/2017  . 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 2 diabetes mellitus ([place]) 09/20/2017  . COPD (chronic obstructive pulmonary disease) ([place]) 09/20/2017  . Tobacco use 09/20/2017  . Symptomatic anemia 09/20/2017  . Headache 09/20/2017  . Serotonin syndrome 09/28/2013  . Acute respiratory failure with hypoxia ([place]) 09/28/2013  . Encephalopathy acute 09/28/2013  . Hypotension, unspecified 09/28/2013  . 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 Type 2 diabetes mellitus ([place]) 03/06/2008  . INSOMNIA, CHRONIC 03/06/2008  . Essential hypertension 03/06/2008  . ASTHMA 03/06/2008  . ECZEMA 03/06/2008  . BACK PAIN, LUMBAR, CHRONIC 03/06/2008    Past Surgical History:  Procedure Laterality Date  . BACK SURGERY  2016   lower back fusion with cage  . BIOPSY  09/23/2017   Procedure:  BIOPSY;  Surgeon: Wonda Horner, MD;  Location: [facility];  Service: Endoscopy;;  . BIOPSY  05/19/2019   Procedure: BIOPSY;  Surgeon: Otis Brace, MD;  Location: [facility];  Service: Gastroenterology;;  . CESAREAN SECTION  1990   x 1   . COLONOSCOPY WITH PROPOFOL N/A 09/23/2017   Procedure: COLONOSCOPY WITH PROPOFOL Hemostatic clips placed;  Surgeon: Wonda Horner, MD;  Location: [facility];  Service: Endoscopy;  Laterality: N/A;  . DILATION AND CURETTAGE OF UTERUS  1988  . ESOPHAGOGASTRODUODENOSCOPY (EGD) WITH PROPOFOL N/A 09/23/2017   Procedure: ESOPHAGOGASTRODUODENOSCOPY (EGD) WITH PROPOFOL;  Surgeon: Wonda Horner, MD;   Location: [facility];  Service: Endoscopy;  Laterality: N/A;  . ESOPHAGOGASTRODUODENOSCOPY (EGD) WITH PROPOFOL N/A 05/19/2019   Procedure: ESOPHAGOGASTRODUODENOSCOPY (EGD) WITH PROPOFOL;  Surgeon: Otis Brace, MD;  Location: [facility];  Service: Gastroenterology;  Laterality: N/A;  . GIVENS CAPSULE STUDY N/A 05/19/2019   Procedure: GIVENS CAPSULE STUDY;  Surgeon: Otis Brace, MD;  Location: [facility];  Service: Gastroenterology;  Laterality: N/A;  . HOT HEMOSTASIS N/A 05/19/2019   Procedure: HOT HEMOSTASIS (ARGON PLASMA COAGULATION/BICAP);  Surgeon: Otis Brace, MD;  Location: [facility] ENDOSCOPY;  Service: Gastroenterology;  Laterality: N/A;  . PARTIAL KNEE ARTHROPLASTY Left 11/12/2017   Procedure: left unicompartmental arthroplasty-medial;  Surgeon: Paralee Cancel, MD;  Location: [facility];  Service: Orthopedics;  Laterality: Left;  32mn  . SUBMUCOSAL INJECTION  09/23/2017   Procedure: SUBMUCOSAL INJECTION;  Surgeon: GWonda Horner MD;  Location: [facility];  Service: Endoscopy;;  in colon  . TUBAL LIGATION  1990     OB History   No obstetric history on file.     Family History  Problem Relation Age of Onset  . Lung cancer Mother   . Diabetes Sister   . Heart disease Sister   . Asthma Daughter   . Arthritis Daughter   . Arthritis Daughter   . Thyroid cancer Other        1/2 sister  . Heart disease Other        1/2 sister    Social History   Tobacco Use  . Smoking status: Every Day    Packs/day: 0.50    Years: 40.00    Pack years: 20.00    Types: Cigarettes  . Smokeless tobacco: Never  Vaping Use  . Vaping Use: Never used  Substance Use Topics  . Alcohol use: Yes    Comment: occ  . Drug use: No    Home Medications Prior to Admission medications   Medication Sig Start Date End Date Taking? Authorizing Provider  acetaminophen (TYLENOL) 500 MG tablet Take 1,000 mg by mouth every 6 (six) hours as needed for mild pain.    [provider]   albuterol (PROVENTIL) (2.5 MG/3ML) 0.083% nebulizer solution Take 2.5 mg by nebulization as needed for wheezing or shortness of breath. 04/15/19   [provider]  allopurinol (ZYLOPRIM) 100 MG tablet Take 100 mg by mouth every evening.    [provider]  amLODipine (NORVASC) 10 MG tablet Take 10 mg by mouth daily.    [provider]  budesonide-formoterol (SYMBICORT) 160-4.5 MCG/ACT inhaler Inhale 2 puffs into the lungs 2 (two) times daily.    [provider]  calcium acetate (PHOSLO) 667 MG capsule Take 3 capsules by mouth in the morning and at bedtime. Take 2 capsules with snacks. 10/25/18   [provider]  carboxymethylcellulose 1 % ophthalmic solution Place 1 drop into both eyes 4 (four) times  daily.    [provider]  cetirizine (ZYRTEC) 10 MG tablet Take 10 mg by mouth 2 (two) times daily as needed (hives).  06/13/17   Shelda Pal, DO  Cholecalciferol (VITAMIN D3) 10 MCG (400 UNIT) tablet Take 400 Units by mouth every evening.    [provider]  cinacalcet (SENSIPAR) 30 MG tablet Take 30 mg by mouth 3 (three) times a week. Tuesday, Thursday, Saturday.    [provider]  cloNIDine (CATAPRES) 0.3 MG tablet Take 1 tablet (0.3 mg total) by mouth 3 (three) times daily. 05/21/19   Hosie Poisson, MD  cyclobenzaprine (FLEXERIL) 10 MG tablet Take 10 mg by mouth 3 (three) times daily as needed for muscle spasms. 06/08/20   [provider]  ELDERBERRY PO Take 2 drops by mouth daily.    [provider]  EPINEPHrine (EPIPEN) 0.3 mg/0.3 mL SOAJ injection Inject 0.3 mLs (0.3 mg total) into the muscle once. Patient taking differently: Inject 0.3 mg into the muscle as needed (for allergic reaction). 09/30/12   Le, Thao P, DO  ferrous sulfate 325 (65 FE) MG tablet Take 325 mg by mouth 3 (three) times a week. TUE THUR SAT    [provider]  fluticasone (FLONASE) 50 MCG/ACT nasal spray Place 1 spray into  both nostrils daily.    [provider]  folic acid (FOLVITE) Q000111Q MCG tablet Take 400 mcg by mouth daily.     [provider]  furosemide (LASIX) 40 MG tablet Take 40 mg by mouth 2 (two) times daily.     [provider]  gabapentin (NEURONTIN) 300 MG capsule Take 1 capsule (300 mg total) by mouth at bedtime as needed (Neuropathic pain). Patient taking differently: Take 300 mg by mouth as needed (pain). 12/14/17 05/18/19  Dana Allan I, MD  hydrALAZINE (APRESOLINE) 100 MG tablet Take 1 tablet (100 mg total) by mouth every 8 (eight) hours. 07/06/20   [name], MD  isosorbide mononitrate (IMDUR) 60 MG 24 hr tablet Take 1 tablet (60 mg total) by mouth daily. 03/02/18   Thurnell Lose, MD  JANUVIA 25 MG tablet Take 1 tablet (25 mg total) by mouth daily. 07/06/20   [name], MD  labetalol (NORMODYNE) 200 MG tablet Take 400 mg by mouth 3 (three) times daily.    [provider]  LORazepam (ATIVAN) 1 MG tablet Take 1 mg by mouth every 8 (eight) hours as needed for anxiety. 06/25/20   [provider]  montelukast (SINGULAIR) 10 MG tablet Take 10 mg by mouth daily with supper.     [provider]  multivitamin (RENA-VIT) TABS tablet Take 1 tablet by mouth daily with supper.    [provider]  olmesartan (BENICAR) 40 MG tablet Take 40 mg by mouth every evening. 05/06/19   [provider]  oxyCODONE (ROXICODONE) 15 MG immediate release tablet Take 15 mg by mouth 3 (three) times daily as needed for pain. 05/31/20   [provider]  pantoprazole (PROTONIX) 40 MG tablet Take 40 mg by mouth daily. 06/09/20   [provider]  predniSONE (DELTASONE) 5 MG tablet Take 1 tablet (5 mg total) by mouth daily with breakfast. 07/06/20   [name], MD  Probiotic Product (PROBIOTIC PO) Take 1 capsule by mouth daily.    [provider]  Tiotropium Bromide Monohydrate (SPIRIVA RESPIMAT IN) Inhale 2 puffs into  the lungs daily.    [provider]  VENTOLIN HFA 108 (90 Base) MCG/ACT inhaler  Inhale 1-2 puffs into the lungs every 6 (six) hours as needed for shortness of breath. wheezing 06/13/15   [provider]  XELJANZ 5 MG TABS Take 5 mg by mouth at bedtime. 05/17/19   [provider]  zolpidem (AMBIEN) 5 MG tablet Take 5 mg by mouth at bedtime. 05/05/19   [provider]    Allergies    3-methyl-2-benzothiazolinone hydrazone, Banana, Black walnut flavor, Hazelnut (filbert) allergy skin test, Leflunomide and related, Lisinopril, No healthtouch food allergies, Other, Pecan extract allergy skin test, Pecan nut (diagnostic), Trazodone and nefazodone, Adalimumab, Escitalopram oxalate, Ezetimibe, Secukinumab, Statins, and Ibuprofen  Review of Systems   Review of Systems  Constitutional:  Negative for appetite change, chills and fever.  HENT:  Negative for ear pain, rhinorrhea, sneezing and sore throat.   Eyes:  Negative for photophobia and visual disturbance.  Respiratory:  Positive for shortness of breath. Negative for cough, chest tightness and wheezing.   Cardiovascular:  Positive for chest pain. Negative for palpitations.  Gastrointestinal:  Negative for abdominal pain, blood in stool, constipation, diarrhea, nausea and vomiting.  Genitourinary:  Negative for dysuria, hematuria and urgency.  Musculoskeletal:  Positive for neck pain. Negative for myalgias.  Skin:  Negative for rash.  Neurological:  Negative for dizziness, weakness and light-headedness.   Physical Exam Updated Vital Signs BP (!) 186/81   Pulse 79   Temp 99.1 F (37.3 C) (Oral)   Resp 17   Ht '5\' 2"'$  (1.575 m)   Wt 76.2 kg   SpO2 100%   BMI 30.73 kg/m   Physical Exam Vitals and nursing note reviewed.  Constitutional:      General: She is not in acute distress.    Appearance: She is well-developed.     Comments: Speaking in complete sentences without signs of respiratory distress.  HENT:      Head: Normocephalic and atraumatic.     Nose: Nose normal.  Eyes:     General: No scleral icterus.       Left eye: No discharge.     Conjunctiva/sclera: Conjunctivae normal.  Cardiovascular:     Rate and Rhythm: Normal rate and regular rhythm.     Heart sounds: Normal heart sounds. No murmur heard.   No friction rub. No gallop.  Pulmonary:     Effort: Pulmonary effort is normal. No respiratory distress.     Breath sounds: Normal breath sounds.  Abdominal:     General: Bowel sounds are normal. There is no distension.     Palpations: Abdomen is soft.     Tenderness: There is no abdominal tenderness. There is no guarding.  Musculoskeletal:        General: Normal range of motion.     Cervical back: Normal range of motion and neck supple.     Right lower leg: No tenderness. No edema.     Left lower leg: No tenderness. No edema.  Skin:    General: Skin is warm and dry.     Findings: No rash.  Neurological:     Mental Status: She is alert.     Motor: No abnormal muscle tone.     Coordination: Coordination normal.    ED Results / Procedures / Treatments   Labs (all labs ordered are listed, but only abnormal results are displayed) Labs Reviewed  CBC WITH DIFFERENTIAL/PLATELET - Abnormal; Notable for the following components:      Result Value   RBC 2.92 (*)    Hemoglobin 9.7 (*)  HCT 30.5 (*)    MCV 104.5 (*)    All other components within normal limits  RESP PANEL BY RT-PCR (FLU A&B, COVID) [id number]  COMPREHENSIVE METABOLIC PANEL  TROPONIN I (HIGH SENSITIVITY)  TROPONIN I (HIGH SENSITIVITY)    EKG None  Radiology CT Cervical Spine Wo Contrast  Result Date: 08/22/2020 CLINICAL DATA:  Initial evaluation for neck pain. EXAM: CT CERVICAL SPINE WITHOUT CONTRAST TECHNIQUE: Multidetector CT imaging of the cervical spine was performed without intravenous contrast. Multiplanar CT image reconstructions were also generated. COMPARISON:  Comparison made with prior postoperative  radiograph from 09/01/2019. FINDINGS: Alignment: Straightening of the normal cervical lordosis. No listhesis. Skull base and vertebrae: Vertebral body height maintained without acute or chronic fracture. No discrete or worrisome osseous lesions. Postoperative changes from prior anterior fusion at C3-C6 with C4 corpectomy. Interbody device in place at C5-6 with solid arthrodesis. There has been interval subsidence at the corpectomy cage, now seen abutting the fixation screws superiorly at C3 and inferiorly at C5. Previously approximately 2 mm of distance with seen between the cage and adjacent screws on prior postoperative radiograph. Trace 1-2 mm lucency seen between the anterior fixation plate and anterior vertebral column, also slightly progressed from prior radiograph. No other significant periprosthetic lucency to suggest loosening or failure. Hardware itself is intact. Soft tissues and spinal canal: Paraspinous soft tissues demonstrate no acute finding. No abnormal prevertebral edema. Moderate atheromatous change about the carotid bifurcations. Disc levels: C2-3: Minimal disc bulge with left-sided facet hypertrophy. No canal or foraminal stenosis. C3-4: Prior fusion with C4 corpectomy. No residual spinal stenosis. Foramina remain patent. C4-5: Prior fusion with C4 corpectomy. No residual spinal stenosis. Foramina remain patent. C5-6: Prior fusion. No residual spinal stenosis. Residual uncinate spurring without significant foraminal encroachment. C6-7: Mild disc bulge.  No spinal stenosis.  Foramina appear patent. C7-T1: Unremarkable. Upper chest: Layering bilateral pleural effusions partially visualized. Mild scattered interlobular septal thickening within the aerated portions of the visualized lungs suggests a degree of associated pulmonary interstitial congestion/edema. Other: None. IMPRESSION: 1. Postoperative changes from prior anterior fusion at C3-C6 with C4 corpectomy. There has been interval subsidence  at the corpectomy cage, now seen abutting the fixation screws superiorly at C3 and inferiorly at C5. Finding could potentially contribute to neck pain. 2. Otherwise negative CT of the cervical spine. No other acute osseous abnormality. No significant canal or foraminal stenosis. 3. Layering bilateral pleural effusions, partially visualized. Mild scattered interlobular septal thickening within the aerated portions of the visualized lungs, suggesting a degree of associated pulmonary interstitial congestion/edema. Electronically Signed   By: Jeannine Boga M.D.   On: 08/22/2020 21:35   DG Chest Port 1 View  Result Date: 08/22/2020 CLINICAL DATA:  Shortness of breath EXAM: PORTABLE CHEST 1 VIEW COMPARISON:  Jul 03, 2020. FINDINGS: Cardiomegaly with central vascular prominence. Aortic atherosclerosis. Diffuse interstitial opacities. Probable small bilateral pleural effusions. Partially visualized cervical fusion hardware. IMPRESSION: Findings most consistent with CHF. Electronically Signed   By: Dahlia Bailiff MD   On: 08/22/2020 20:07    Procedures Procedures   Medications Ordered in ED Medications  labetalol (NORMODYNE) tablet 200 mg (has no administration in time range)  ipratropium-albuterol (DUONEB) 0.5-2.5 (3) MG/3ML nebulizer solution 3 mL (3 mLs Nebulization Given 08/22/20 2211)  hydrALAZINE (APRESOLINE) tablet 100 mg (100 mg Oral Given 08/22/20 2214)  cloNIDine (CATAPRES) tablet 0.3 mg (0.3 mg Oral Given 08/22/20 2214)    ED Course  I have reviewed the triage vital signs and the nursing  notes.  Pertinent labs & imaging results that were available during my care of the patient were reviewed by me and considered in my medical decision making (see chart for details).  Clinical Course as of 08/23/20 0005  Wed Aug 22, 2020  2205 BP(!): 211/81 Patient has not taking her antihypertensives that she typically takes tonight.  I have ordered these to be given here. [HK]  2225 Hemoglobin(!): 9.7  [HK]  2257 CT Cervical Spine Wo Contrast Postop changes without evidence of hardware failure or loosening. [HK]  2323 Creatinine(!): 3.14 [HK]  2323 Potassium: 4.3 [HK]  2323 Anion gap: 7 [HK]  2323 Troponin I (High Sensitivity)(!): 19 [HK]  Thu Aug 23, 2020  0005 SARS Coronavirus 2 by RT PCR: NEGATIVE [HK]    Clinical Course User Index [HK] Delia Heady, PA-C   MDM Rules/Calculators/A&P                          60 year old female with past medical history of ESRD on dialysis, hypertension, GERD presenting to the ED for shortness of breath.  Chest pain intermittently since yesterday.  Shortness of breath happened today.  1 dialysis this morning without any issues.  Temp of 100 at home.  Reports neck pain has been chronic for several months.  This is unchanged.  No recent injury or trauma.  Abdomen is soft.  No signs respiratory distress on arrival.  Lungs are clear.  Will obtain lab work, imaging and reassess.  Patient developed wheezing, requested breathing treatment which I have ordered.  She is hypertensive here so I ordered her home labetalol, hydralazine and clonidine to be given.  Chest x-ray shows findings consistent with CHF.  CT of the cervical spine shows postop changes without any suggestions of loosening or failure of hardware.  No acute findings.  Again redemonstrated pleural effusions. She reports improvement with DuoNeb.  Her creatinine is consistent with her ESRD.  Normal potassium and anion gap here.  Her first troponin is 19.  She will need a repeat troponin as well as an EKG to evaluate for ischemia.  I have ordered her home antihypertensive so we will need to make sure her blood pressure is improved here. Suspect that if her delta troponin is stable and her EKG does not show ischemia as well as if her blood pressure improves will be able to be discharged home with continued home medications and continuing dialysis as regularly scheduled.  She does not appear in respiratory  distress and is not hypoxic here.  Care handed off to oncoming provider pending recheck of blood pressure after antihypertensives, delta troponin and EKG.    Portions of this note were generated with [employer]. Dictation errors may occur despite best attempts at proofreading.  Final Clinical Impression(s) / ED Diagnoses Final diagnoses:  SOB (shortness of breath)    Rx / DC Orders ED Discharge Orders     None        Delia Heady, PA-C 08/22/20 2336    Lucrezia Starch, MD 08/24/20 1140

## 2020-08-23 LAB — RESP PANEL BY RT-PCR (FLU A&B, COVID) ARPGX2
Influenza A by PCR: NEGATIVE
Influenza B by PCR: NEGATIVE
SARS Coronavirus 2 by RT PCR: NEGATIVE

## 2020-08-23 LAB — TROPONIN I (HIGH SENSITIVITY): Troponin I (High Sensitivity): 18 ng/L — ABNORMAL HIGH (ref <18)

## 2020-08-23 MED ORDER — LABETALOL HCL 200 MG PO TABS
200.0000 mg | ORAL_TABLET | Freq: Once | ORAL | Status: AC
  Administered 2020-08-23: 200 mg via ORAL
  Filled 2020-08-23: qty 1

## 2020-08-23 MED ORDER — ALBUTEROL SULFATE HFA 108 (90 BASE) MCG/ACT IN AERS
2.0000 | INHALATION_SPRAY | RESPIRATORY_TRACT | Status: DC | PRN
  Administered 2020-08-23: 2 via RESPIRATORY_TRACT
  Filled 2020-08-23: qty 6.7

## 2020-08-23 NOTE — ED Provider Notes (Signed)
Received signout from previous provider, please see her note for complete H&P.  Patient with multiple comorbidities including end-stage renal disease currently on dialysis had a dialysis session today.  She is here with chest pain shortness of breath as well as having some cold symptoms including feeling feverish.  Had a negative COVID test here.  Blood pressure did improved.  When ambulating, O2 sats at 98% to 100% on room air.  Patient may go home and take her usual blood pressure medication.  She was also will need dialysis tomorrow as per schedule.  Return precaution given.  Also recommend patient to follow-up with neurosurgeon due to her neck pain as CT scan today did demonstrate some mild changes to her hardware.  Unsure if this is related to her neck discomfort but she does not have any focal neurodeficit requiring emergent management at this time.  BP (!) 200/88   Pulse 71   Temp 99.1 F (37.3 C) (Oral)   Resp 13   Ht '5\' 2"'$  (1.575 m)   Wt 76.2 kg   SpO2 100%   BMI 30.73 kg/m   Results for orders placed or performed during the hospital encounter of 08/22/20  Resp Panel by RT-PCR (Flu A&B, Covid) Nasopharyngeal Swab   Specimen: Nasopharyngeal Swab; Nasopharyngeal(NP) swabs in vial transport medium  Result Value Ref Range   SARS Coronavirus 2 by RT PCR NEGATIVE NEGATIVE   Influenza A by PCR NEGATIVE NEGATIVE   Influenza B by PCR NEGATIVE NEGATIVE  Comprehensive metabolic panel  Result Value Ref Range   Sodium 138 135 - 145 mmol/L   Potassium 4.3 3.5 - 5.1 mmol/L   Chloride 99 98 - 111 mmol/L   CO2 32 22 - 32 mmol/L   Glucose, Bld 72 70 - 99 mg/dL   BUN 7 6 - 20 mg/dL   Creatinine, Ser 3.14 (H) 0.44 - 1.00 mg/dL   Calcium 10.1 8.9 - 10.3 mg/dL   Total Protein 6.1 (L) 6.5 - 8.1 g/dL   Albumin 2.9 (L) 3.5 - 5.0 g/dL   AST 16 15 - 41 U/L   ALT 12 0 - 44 U/L   Alkaline Phosphatase 67 38 - 126 U/L   Total Bilirubin 0.7 0.3 - 1.2 mg/dL   GFR, Estimated 16 (L) >60 mL/min   Anion gap  7 5 - 15  CBC with Differential  Result Value Ref Range   WBC 4.2 4.0 - 10.5 K/uL   RBC 2.92 (L) 3.87 - 5.11 MIL/uL   Hemoglobin 9.7 (L) 12.0 - 15.0 g/dL   HCT 30.5 (L) 36.0 - 46.0 %   MCV 104.5 (H) 80.0 - 100.0 fL   MCH 33.2 26.0 - 34.0 pg   MCHC 31.8 30.0 - 36.0 g/dL   RDW 15.3 11.5 - 15.5 %   Platelets 153 150 - 400 K/uL   nRBC 0.0 0.0 - 0.2 %   Neutrophils Relative % 62 %   Neutro Abs 2.6 1.7 - 7.7 K/uL   Lymphocytes Relative 19 %   Lymphs Abs 0.8 0.7 - 4.0 K/uL   Monocytes Relative 13 %   Monocytes Absolute 0.6 0.1 - 1.0 K/uL   Eosinophils Relative 5 %   Eosinophils Absolute 0.2 0.0 - 0.5 K/uL   Basophils Relative 1 %   Basophils Absolute 0.0 0.0 - 0.1 K/uL   Immature Granulocytes 0 %   Abs Immature Granulocytes 0.01 0.00 - 0.07 K/uL  Troponin I (High Sensitivity)  Result Value Ref Range   Troponin  I (High Sensitivity) 19 (H) <18 ng/L  Troponin I (High Sensitivity)  Result Value Ref Range   Troponin I (High Sensitivity) 18 (H) <18 ng/L   CT Cervical Spine Wo Contrast  Result Date: 08/22/2020 CLINICAL DATA:  Initial evaluation for neck pain. EXAM: CT CERVICAL SPINE WITHOUT CONTRAST TECHNIQUE: Multidetector CT imaging of the cervical spine was performed without intravenous contrast. Multiplanar CT image reconstructions were also generated. COMPARISON:  Comparison made with prior postoperative radiograph from 09/01/2019. FINDINGS: Alignment: Straightening of the normal cervical lordosis. No listhesis. Skull base and vertebrae: Vertebral body height maintained without acute or chronic fracture. No discrete or worrisome osseous lesions. Postoperative changes from prior anterior fusion at C3-C6 with C4 corpectomy. Interbody device in place at C5-6 with solid arthrodesis. There has been interval subsidence at the corpectomy cage, now seen abutting the fixation screws superiorly at C3 and inferiorly at C5. Previously approximately 2 mm of distance with seen between the cage and  adjacent screws on prior postoperative radiograph. Trace 1-2 mm lucency seen between the anterior fixation plate and anterior vertebral column, also slightly progressed from prior radiograph. No other significant periprosthetic lucency to suggest loosening or failure. Hardware itself is intact. Soft tissues and spinal canal: Paraspinous soft tissues demonstrate no acute finding. No abnormal prevertebral edema. Moderate atheromatous change about the carotid bifurcations. Disc levels: C2-3: Minimal disc bulge with left-sided facet hypertrophy. No canal or foraminal stenosis. C3-4: Prior fusion with C4 corpectomy. No residual spinal stenosis. Foramina remain patent. C4-5: Prior fusion with C4 corpectomy. No residual spinal stenosis. Foramina remain patent. C5-6: Prior fusion. No residual spinal stenosis. Residual uncinate spurring without significant foraminal encroachment. C6-7: Mild disc bulge.  No spinal stenosis.  Foramina appear patent. C7-T1: Unremarkable. Upper chest: Layering bilateral pleural effusions partially visualized. Mild scattered interlobular septal thickening within the aerated portions of the visualized lungs suggests a degree of associated pulmonary interstitial congestion/edema. Other: None. IMPRESSION: 1. Postoperative changes from prior anterior fusion at C3-C6 with C4 corpectomy. There has been interval subsidence at the corpectomy cage, now seen abutting the fixation screws superiorly at C3 and inferiorly at C5. Finding could potentially contribute to neck pain. 2. Otherwise negative CT of the cervical spine. No other acute osseous abnormality. No significant canal or foraminal stenosis. 3. Layering bilateral pleural effusions, partially visualized. Mild scattered interlobular septal thickening within the aerated portions of the visualized lungs, suggesting a degree of associated pulmonary interstitial congestion/edema. Electronically Signed   By: Jeannine Boga M.D.   On: 08/22/2020  21:35   DG Chest Port 1 View  Result Date: 08/22/2020 CLINICAL DATA:  Shortness of breath EXAM: PORTABLE CHEST 1 VIEW COMPARISON:  Jul 03, 2020. FINDINGS: Cardiomegaly with central vascular prominence. Aortic atherosclerosis. Diffuse interstitial opacities. Probable small bilateral pleural effusions. Partially visualized cervical fusion hardware. IMPRESSION: Findings most consistent with CHF. Electronically Signed   By: Dahlia Bailiff MD   On: 08/22/2020 20:07      Domenic Moras, PA-C 08/23/20 ID:[id number]    Ripley Fraise, MD 08/24/20 [phone]

## 2020-08-23 NOTE — ED Notes (Signed)
RN to MD about plan of care for patient due to concerns about ongoing high BP and SOB and concerning lab results. MD aware, but concerned about 2nd troponin at this time. RN to continue to monitor patient.

## 2020-08-23 NOTE — ED Notes (Signed)
Pt complaints of shOB after ambulating. Pt connected back to monitor and PA made aware

## 2020-08-23 NOTE — Discharge Instructions (Addendum)
Continue your home medications as previously prescribed. Take your blood pressure medication. Continue dialysis sessions. Return to the ER if you start to experience worsening shortness of breath, chest pain, increased leg swelling. Do not forget to follow-up closely with your neurosurgeon for reassessments of your recurrent neck pain as the CT scan today did mention some slight changes to the location of your hardware.

## 2020-08-23 NOTE — ED Notes (Signed)
RN concern about patient BP. Discussed with ED provider plan for patient du

## 2020-08-23 NOTE — ED Notes (Signed)
Ambulated in room with sats between 98%-100% RA

## 2020-08-23 NOTE — ED Notes (Signed)
Pt complained of shortness of breath after ambulating to the bathroom. RN made aware.

## 2020-09-08 ENCOUNTER — Emergency Department (HOSPITAL_BASED_OUTPATIENT_CLINIC_OR_DEPARTMENT_OTHER): Payer: Medicare PPO

## 2020-09-08 ENCOUNTER — Encounter (HOSPITAL_BASED_OUTPATIENT_CLINIC_OR_DEPARTMENT_OTHER): Payer: Self-pay

## 2020-09-08 ENCOUNTER — Inpatient Hospital Stay (HOSPITAL_BASED_OUTPATIENT_CLINIC_OR_DEPARTMENT_OTHER)
Admission: EM | Admit: 2020-09-08 | Discharge: 2020-10-04 | DRG: 233 | Disposition: A | Discharge status: Home Health | Payer: Medicare PPO | Attending: Surgery | Admitting: Surgery

## 2020-09-08 ENCOUNTER — Other Ambulatory Visit: Payer: Self-pay

## 2020-09-08 DIAGNOSIS — E871 Hypo-osmolality and hyponatremia: Secondary | ICD-10-CM | POA: Diagnosis present

## 2020-09-08 DIAGNOSIS — M109 Gout, unspecified: Secondary | ICD-10-CM | POA: Diagnosis present

## 2020-09-08 DIAGNOSIS — E1151 Type 2 diabetes mellitus with diabetic peripheral angiopathy without gangrene: Secondary | ICD-10-CM | POA: Diagnosis present

## 2020-09-08 DIAGNOSIS — E440 Moderate protein-calorie malnutrition: Secondary | ICD-10-CM | POA: Diagnosis present

## 2020-09-08 DIAGNOSIS — G9341 Metabolic encephalopathy: Secondary | ICD-10-CM | POA: Diagnosis present

## 2020-09-08 DIAGNOSIS — J90 Pleural effusion, not elsewhere classified: Secondary | ICD-10-CM

## 2020-09-08 DIAGNOSIS — Z951 Presence of aortocoronary bypass graft: Secondary | ICD-10-CM | POA: Diagnosis not present

## 2020-09-08 DIAGNOSIS — J441 Chronic obstructive pulmonary disease with (acute) exacerbation: Secondary | ICD-10-CM | POA: Diagnosis present

## 2020-09-08 DIAGNOSIS — I080 Rheumatic disorders of both mitral and aortic valves: Secondary | ICD-10-CM | POA: Diagnosis present

## 2020-09-08 DIAGNOSIS — J449 Chronic obstructive pulmonary disease, unspecified: Secondary | ICD-10-CM | POA: Diagnosis not present

## 2020-09-08 DIAGNOSIS — J939 Pneumothorax, unspecified: Secondary | ICD-10-CM | POA: Diagnosis not present

## 2020-09-08 DIAGNOSIS — I5031 Acute diastolic (congestive) heart failure: Secondary | ICD-10-CM | POA: Diagnosis not present

## 2020-09-08 DIAGNOSIS — Z96652 Presence of left artificial knee joint: Secondary | ICD-10-CM | POA: Diagnosis present

## 2020-09-08 DIAGNOSIS — Z7984 Long term (current) use of oral hypoglycemic drugs: Secondary | ICD-10-CM

## 2020-09-08 DIAGNOSIS — Z888 Allergy status to other drugs, medicaments and biological substances status: Secondary | ICD-10-CM

## 2020-09-08 DIAGNOSIS — E1165 Type 2 diabetes mellitus with hyperglycemia: Secondary | ICD-10-CM | POA: Diagnosis not present

## 2020-09-08 DIAGNOSIS — D631 Anemia in chronic kidney disease: Secondary | ICD-10-CM | POA: Diagnosis present

## 2020-09-08 DIAGNOSIS — Z833 Family history of diabetes mellitus: Secondary | ICD-10-CM

## 2020-09-08 DIAGNOSIS — L4050 Arthropathic psoriasis, unspecified: Secondary | ICD-10-CM | POA: Diagnosis present

## 2020-09-08 DIAGNOSIS — M069 Rheumatoid arthritis, unspecified: Secondary | ICD-10-CM | POA: Diagnosis present

## 2020-09-08 DIAGNOSIS — K922 Gastrointestinal hemorrhage, unspecified: Secondary | ICD-10-CM | POA: Diagnosis not present

## 2020-09-08 DIAGNOSIS — I132 Hypertensive heart and chronic kidney disease with heart failure and with stage 5 chronic kidney disease, or end stage renal disease: Secondary | ICD-10-CM | POA: Diagnosis present

## 2020-09-08 DIAGNOSIS — I161 Hypertensive emergency: Secondary | ICD-10-CM | POA: Diagnosis present

## 2020-09-08 DIAGNOSIS — F419 Anxiety disorder, unspecified: Secondary | ICD-10-CM | POA: Diagnosis present

## 2020-09-08 DIAGNOSIS — Z8719 Personal history of other diseases of the digestive system: Secondary | ICD-10-CM

## 2020-09-08 DIAGNOSIS — Z20822 Contact with and (suspected) exposure to covid-19: Secondary | ICD-10-CM | POA: Diagnosis present

## 2020-09-08 DIAGNOSIS — J811 Chronic pulmonary edema: Secondary | ICD-10-CM

## 2020-09-08 DIAGNOSIS — I509 Heart failure, unspecified: Secondary | ICD-10-CM

## 2020-09-08 DIAGNOSIS — E1122 Type 2 diabetes mellitus with diabetic chronic kidney disease: Secondary | ICD-10-CM | POA: Diagnosis present

## 2020-09-08 DIAGNOSIS — I313 Pericardial effusion (noninflammatory): Secondary | ICD-10-CM | POA: Diagnosis present

## 2020-09-08 DIAGNOSIS — M898X9 Other specified disorders of bone, unspecified site: Secondary | ICD-10-CM | POA: Diagnosis present

## 2020-09-08 DIAGNOSIS — I214 Non-ST elevation (NSTEMI) myocardial infarction: Principal | ICD-10-CM | POA: Diagnosis present

## 2020-09-08 DIAGNOSIS — Z6826 Body mass index (BMI) 26.0-26.9, adult: Secondary | ICD-10-CM

## 2020-09-08 DIAGNOSIS — I160 Hypertensive urgency: Secondary | ICD-10-CM | POA: Insufficient documentation

## 2020-09-08 DIAGNOSIS — F17210 Nicotine dependence, cigarettes, uncomplicated: Secondary | ICD-10-CM | POA: Diagnosis present

## 2020-09-08 DIAGNOSIS — I700 Atherosclerosis of aorta: Secondary | ICD-10-CM | POA: Diagnosis present

## 2020-09-08 DIAGNOSIS — J9601 Acute respiratory failure with hypoxia: Secondary | ICD-10-CM | POA: Diagnosis present

## 2020-09-08 DIAGNOSIS — Z981 Arthrodesis status: Secondary | ICD-10-CM

## 2020-09-08 DIAGNOSIS — K648 Other hemorrhoids: Secondary | ICD-10-CM | POA: Diagnosis present

## 2020-09-08 DIAGNOSIS — K254 Chronic or unspecified gastric ulcer with hemorrhage: Secondary | ICD-10-CM | POA: Diagnosis present

## 2020-09-08 DIAGNOSIS — R569 Unspecified convulsions: Secondary | ICD-10-CM | POA: Diagnosis not present

## 2020-09-08 DIAGNOSIS — Z79899 Other long term (current) drug therapy: Secondary | ICD-10-CM

## 2020-09-08 DIAGNOSIS — K2970 Gastritis, unspecified, without bleeding: Secondary | ICD-10-CM | POA: Diagnosis present

## 2020-09-08 DIAGNOSIS — I5033 Acute on chronic diastolic (congestive) heart failure: Secondary | ICD-10-CM | POA: Diagnosis present

## 2020-09-08 DIAGNOSIS — L8990 Pressure ulcer of unspecified site, unspecified stage: Secondary | ICD-10-CM | POA: Insufficient documentation

## 2020-09-08 DIAGNOSIS — Z886 Allergy status to analgesic agent status: Secondary | ICD-10-CM

## 2020-09-08 DIAGNOSIS — G253 Myoclonus: Secondary | ICD-10-CM | POA: Diagnosis not present

## 2020-09-08 DIAGNOSIS — R29810 Facial weakness: Secondary | ICD-10-CM | POA: Diagnosis not present

## 2020-09-08 DIAGNOSIS — I10 Essential (primary) hypertension: Secondary | ICD-10-CM | POA: Diagnosis not present

## 2020-09-08 DIAGNOSIS — D500 Iron deficiency anemia secondary to blood loss (chronic): Secondary | ICD-10-CM | POA: Diagnosis not present

## 2020-09-08 DIAGNOSIS — D6959 Other secondary thrombocytopenia: Secondary | ICD-10-CM | POA: Diagnosis not present

## 2020-09-08 DIAGNOSIS — Z992 Dependence on renal dialysis: Secondary | ICD-10-CM | POA: Diagnosis not present

## 2020-09-08 DIAGNOSIS — D62 Acute posthemorrhagic anemia: Secondary | ICD-10-CM | POA: Diagnosis not present

## 2020-09-08 DIAGNOSIS — Y848 Other medical procedures as the cause of abnormal reaction of the patient, or of later complication, without mention of misadventure at the time of the procedure: Secondary | ICD-10-CM | POA: Diagnosis not present

## 2020-09-08 DIAGNOSIS — J810 Acute pulmonary edema: Secondary | ICD-10-CM | POA: Diagnosis not present

## 2020-09-08 DIAGNOSIS — G4730 Sleep apnea, unspecified: Secondary | ICD-10-CM | POA: Diagnosis present

## 2020-09-08 DIAGNOSIS — I2510 Atherosclerotic heart disease of native coronary artery without angina pectoris: Secondary | ICD-10-CM | POA: Diagnosis not present

## 2020-09-08 DIAGNOSIS — Z91018 Allergy to other foods: Secondary | ICD-10-CM

## 2020-09-08 DIAGNOSIS — K3182 Dieulafoy lesion (hemorrhagic) of stomach and duodenum: Secondary | ICD-10-CM | POA: Diagnosis present

## 2020-09-08 DIAGNOSIS — M48061 Spinal stenosis, lumbar region without neurogenic claudication: Secondary | ICD-10-CM | POA: Diagnosis present

## 2020-09-08 DIAGNOSIS — R0602 Shortness of breath: Secondary | ICD-10-CM

## 2020-09-08 DIAGNOSIS — K5900 Constipation, unspecified: Secondary | ICD-10-CM | POA: Diagnosis present

## 2020-09-08 DIAGNOSIS — K219 Gastro-esophageal reflux disease without esophagitis: Secondary | ICD-10-CM | POA: Diagnosis present

## 2020-09-08 DIAGNOSIS — K5730 Diverticulosis of large intestine without perforation or abscess without bleeding: Secondary | ICD-10-CM | POA: Diagnosis present

## 2020-09-08 DIAGNOSIS — I480 Paroxysmal atrial fibrillation: Secondary | ICD-10-CM | POA: Diagnosis not present

## 2020-09-08 DIAGNOSIS — Z452 Encounter for adjustment and management of vascular access device: Secondary | ICD-10-CM

## 2020-09-08 DIAGNOSIS — E1140 Type 2 diabetes mellitus with diabetic neuropathy, unspecified: Secondary | ICD-10-CM | POA: Diagnosis present

## 2020-09-08 DIAGNOSIS — Z09 Encounter for follow-up examination after completed treatment for conditions other than malignant neoplasm: Secondary | ICD-10-CM

## 2020-09-08 DIAGNOSIS — T82594A Other mechanical complication of infusion catheter, initial encounter: Secondary | ICD-10-CM | POA: Diagnosis not present

## 2020-09-08 DIAGNOSIS — I25110 Atherosclerotic heart disease of native coronary artery with unstable angina pectoris: Secondary | ICD-10-CM | POA: Diagnosis present

## 2020-09-08 DIAGNOSIS — N186 End stage renal disease: Secondary | ICD-10-CM | POA: Diagnosis present

## 2020-09-08 DIAGNOSIS — Z720 Tobacco use: Secondary | ICD-10-CM | POA: Diagnosis not present

## 2020-09-08 DIAGNOSIS — K279 Peptic ulcer, site unspecified, unspecified as acute or chronic, without hemorrhage or perforation: Secondary | ICD-10-CM | POA: Diagnosis not present

## 2020-09-08 DIAGNOSIS — E876 Hypokalemia: Secondary | ICD-10-CM | POA: Diagnosis present

## 2020-09-08 DIAGNOSIS — Z01810 Encounter for preprocedural cardiovascular examination: Secondary | ICD-10-CM | POA: Diagnosis not present

## 2020-09-08 DIAGNOSIS — Z7952 Long term (current) use of systemic steroids: Secondary | ICD-10-CM

## 2020-09-08 DIAGNOSIS — Z8249 Family history of ischemic heart disease and other diseases of the circulatory system: Secondary | ICD-10-CM

## 2020-09-08 DIAGNOSIS — I4891 Unspecified atrial fibrillation: Secondary | ICD-10-CM | POA: Diagnosis not present

## 2020-09-08 DIAGNOSIS — J9811 Atelectasis: Secondary | ICD-10-CM | POA: Diagnosis not present

## 2020-09-08 DIAGNOSIS — F5104 Psychophysiologic insomnia: Secondary | ICD-10-CM | POA: Diagnosis present

## 2020-09-08 DIAGNOSIS — I440 Atrioventricular block, first degree: Secondary | ICD-10-CM | POA: Diagnosis present

## 2020-09-08 DIAGNOSIS — G8929 Other chronic pain: Secondary | ICD-10-CM | POA: Diagnosis present

## 2020-09-08 HISTORY — DX: Nonrheumatic aortic (valve) stenosis: I35.0

## 2020-09-08 HISTORY — DX: Nonrheumatic mitral (valve) insufficiency: I34.0

## 2020-09-08 LAB — CBC WITH DIFFERENTIAL/PLATELET
Abs Immature Granulocytes: 0.03 10*3/uL (ref 0.00–0.07)
Basophils Absolute: 0.1 10*3/uL (ref 0.0–0.1)
Basophils Relative: 1 %
Eosinophils Absolute: 0.4 10*3/uL (ref 0.0–0.5)
Eosinophils Relative: 6 %
HCT: 28.1 % — ABNORMAL LOW (ref 36.0–46.0)
Hemoglobin: 9.2 g/dL — ABNORMAL LOW (ref 12.0–15.0)
Immature Granulocytes: 0 %
Lymphocytes Relative: 18 %
Lymphs Abs: 1.3 10*3/uL (ref 0.7–4.0)
MCH: 33.1 pg (ref 26.0–34.0)
MCHC: 32.7 g/dL (ref 30.0–36.0)
MCV: 101.1 fL — ABNORMAL HIGH (ref 80.0–100.0)
Monocytes Absolute: 0.6 10*3/uL (ref 0.1–1.0)
Monocytes Relative: 9 %
Neutro Abs: 4.7 10*3/uL (ref 1.7–7.7)
Neutrophils Relative %: 66 %
Platelets: 243 10*3/uL (ref 150–400)
RBC: 2.78 MIL/uL — ABNORMAL LOW (ref 3.87–5.11)
RDW: 14.2 % (ref 11.5–15.5)
WBC: 7.1 10*3/uL (ref 4.0–10.5)
nRBC: 0 % (ref 0.0–0.2)

## 2020-09-08 LAB — RESP PANEL BY RT-PCR (FLU A&B, COVID) ARPGX2
Influenza A by PCR: NEGATIVE
Influenza B by PCR: NEGATIVE
SARS Coronavirus 2 by RT PCR: NEGATIVE

## 2020-09-08 LAB — PROTIME-INR
INR: 1.1 (ref 0.8–1.2)
Prothrombin Time: 14.4 seconds (ref 11.4–15.2)

## 2020-09-08 LAB — BASIC METABOLIC PANEL
Anion gap: 8 (ref 5–15)
BUN: 18 mg/dL (ref 6–20)
CO2: 29 mmol/L (ref 22–32)
Calcium: 10.3 mg/dL (ref 8.9–10.3)
Chloride: 97 mmol/L — ABNORMAL LOW (ref 98–111)
Creatinine, Ser: 3.86 mg/dL — ABNORMAL HIGH (ref 0.44–1.00)
GFR, Estimated: 13 mL/min — ABNORMAL LOW (ref >60)
Glucose, Bld: 103 mg/dL — ABNORMAL HIGH (ref 70–99)
Potassium: 4.4 mmol/L (ref 3.5–5.1)
Sodium: 134 mmol/L — ABNORMAL LOW (ref 135–145)

## 2020-09-08 LAB — TROPONIN I (HIGH SENSITIVITY)
Troponin I (High Sensitivity): 40 ng/L — ABNORMAL HIGH (ref <18)
Troponin I (High Sensitivity): 126 ng/L (ref <18)
Troponin I (High Sensitivity): 330 ng/L

## 2020-09-08 LAB — MAGNESIUM: Magnesium: 1.9 mg/dL (ref 1.7–2.4)

## 2020-09-08 MED ORDER — NITROGLYCERIN 0.4 MG SL SUBL
0.4000 mg | SUBLINGUAL_TABLET | Freq: Once | SUBLINGUAL | Status: AC
  Administered 2020-09-08: 0.4 mg via SUBLINGUAL
  Filled 2020-09-08: qty 1

## 2020-09-08 MED ORDER — ALBUTEROL SULFATE HFA 108 (90 BASE) MCG/ACT IN AERS
1.0000 | INHALATION_SPRAY | Freq: Four times a day (QID) | RESPIRATORY_TRACT | Status: DC | PRN
  Filled 2020-09-08: qty 6.7

## 2020-09-08 MED ORDER — ALBUTEROL SULFATE (2.5 MG/3ML) 0.083% IN NEBU
INHALATION_SOLUTION | RESPIRATORY_TRACT | Status: AC
  Administered 2020-09-08: 2.5 mg via RESPIRATORY_TRACT
  Filled 2020-09-08: qty 3

## 2020-09-08 MED ORDER — AMLODIPINE BESYLATE 10 MG PO TABS
10.0000 mg | ORAL_TABLET | Freq: Every day | ORAL | Status: DC
  Administered 2020-09-09 – 2020-09-12 (×4): 10 mg via ORAL
  Filled 2020-09-08 (×2): qty 1
  Filled 2020-09-08: qty 2
  Filled 2020-09-08: qty 1

## 2020-09-08 MED ORDER — UMECLIDINIUM BROMIDE 62.5 MCG/INH IN AEPB
1.0000 | INHALATION_SPRAY | Freq: Every day | RESPIRATORY_TRACT | Status: DC
  Administered 2020-09-09 – 2020-09-19 (×4): 1 via RESPIRATORY_TRACT
  Filled 2020-09-08 (×2): qty 7

## 2020-09-08 MED ORDER — MONTELUKAST SODIUM 10 MG PO TABS
10.0000 mg | ORAL_TABLET | Freq: Every day | ORAL | Status: DC
  Administered 2020-09-08 – 2020-10-03 (×23): 10 mg via ORAL
  Filled 2020-09-08 (×23): qty 1

## 2020-09-08 MED ORDER — CLONIDINE HCL 0.2 MG PO TABS
0.3000 mg | ORAL_TABLET | Freq: Two times a day (BID) | ORAL | Status: DC
  Administered 2020-09-08 – 2020-09-09 (×2): 0.3 mg via ORAL
  Filled 2020-09-08 (×2): qty 1

## 2020-09-08 MED ORDER — IPRATROPIUM-ALBUTEROL 0.5-2.5 (3) MG/3ML IN SOLN
3.0000 mL | Freq: Four times a day (QID) | RESPIRATORY_TRACT | Status: DC
  Administered 2020-09-08 – 2020-09-09 (×5): 3 mL via RESPIRATORY_TRACT
  Filled 2020-09-08 (×6): qty 3

## 2020-09-08 MED ORDER — HYDRALAZINE HCL 50 MG PO TABS
100.0000 mg | ORAL_TABLET | Freq: Three times a day (TID) | ORAL | Status: DC
  Administered 2020-09-08 – 2020-09-12 (×10): 100 mg via ORAL
  Filled 2020-09-08 (×12): qty 2

## 2020-09-08 MED ORDER — TOFACITINIB CITRATE 5 MG PO TABS: 5.0000 mg | ORAL_TABLET | Freq: Every day | ORAL | Status: DC

## 2020-09-08 MED ORDER — LORAZEPAM 1 MG PO TABS
1.0000 mg | ORAL_TABLET | Freq: Three times a day (TID) | ORAL | Status: DC | PRN
  Administered 2020-09-10 – 2020-09-13 (×2): 1 mg via ORAL
  Filled 2020-09-08 (×2): qty 1

## 2020-09-08 MED ORDER — LORATADINE 10 MG PO TABS
10.0000 mg | ORAL_TABLET | Freq: Every day | ORAL | Status: DC
  Administered 2020-09-09 – 2020-09-12 (×4): 10 mg via ORAL
  Filled 2020-09-08 (×4): qty 1

## 2020-09-08 MED ORDER — ZOLPIDEM TARTRATE 5 MG PO TABS
5.0000 mg | ORAL_TABLET | Freq: Every day | ORAL | Status: DC
  Administered 2020-09-08 – 2020-09-12 (×5): 5 mg via ORAL
  Filled 2020-09-08 (×5): qty 1

## 2020-09-08 MED ORDER — ACETAMINOPHEN 325 MG PO TABS: 650.0000 mg | ORAL_TABLET | ORAL | Status: DC | PRN

## 2020-09-08 MED ORDER — NITROGLYCERIN 0.2 MG/HR TD PT24
0.2000 mg | MEDICATED_PATCH | Freq: Every day | TRANSDERMAL | Status: DC
  Administered 2020-09-09: 0.2 mg via TRANSDERMAL
  Filled 2020-09-08 (×2): qty 1

## 2020-09-08 MED ORDER — PREDNISONE 20 MG PO TABS
40.0000 mg | ORAL_TABLET | Freq: Every day | ORAL | Status: DC
  Administered 2020-09-09: 40 mg via ORAL
  Filled 2020-09-08: qty 2

## 2020-09-08 MED ORDER — FERROUS SULFATE 325 (65 FE) MG PO TABS
325.0000 mg | ORAL_TABLET | ORAL | Status: DC
  Administered 2020-09-12 – 2020-09-28 (×8): 325 mg via ORAL
  Filled 2020-09-08 (×10): qty 1

## 2020-09-08 MED ORDER — FUROSEMIDE 10 MG/ML IJ SOLN
80.0000 mg | Freq: Two times a day (BID) | INTRAMUSCULAR | Status: DC
  Administered 2020-09-08 – 2020-09-10 (×4): 80 mg via INTRAVENOUS
  Filled 2020-09-08 (×4): qty 8

## 2020-09-08 MED ORDER — ONDANSETRON HCL 4 MG/2ML IJ SOLN: 4.0000 mg | Freq: Four times a day (QID) | INTRAMUSCULAR | Status: DC | PRN

## 2020-09-08 MED ORDER — INSULIN ASPART 100 UNIT/ML IJ SOLN
0.0000 [IU] | Freq: Three times a day (TID) | INTRAMUSCULAR | Status: DC
  Administered 2020-09-09: 2 [IU] via SUBCUTANEOUS
  Administered 2020-09-10 – 2020-09-11 (×2): 1 [IU] via SUBCUTANEOUS

## 2020-09-08 MED ORDER — FLUTICASONE PROPIONATE 50 MCG/ACT NA SUSP
1.0000 | Freq: Every day | NASAL | Status: DC
  Administered 2020-09-09 – 2020-10-04 (×15): 1 via NASAL
  Filled 2020-09-08: qty 16

## 2020-09-08 MED ORDER — ISOSORBIDE MONONITRATE ER 30 MG PO TB24: 60.0000 mg | ORAL_TABLET | Freq: Every day | ORAL | Status: DC

## 2020-09-08 MED ORDER — GABAPENTIN 300 MG PO CAPS
300.0000 mg | ORAL_CAPSULE | Freq: Every evening | ORAL | Status: DC | PRN
  Administered 2020-09-14 – 2020-09-25 (×3): 300 mg via ORAL
  Filled 2020-09-08 (×4): qty 1

## 2020-09-08 MED ORDER — SODIUM CHLORIDE 0.9% FLUSH
3.0000 mL | Freq: Two times a day (BID) | INTRAVENOUS | Status: DC
  Administered 2020-09-08 – 2020-09-09 (×2): 3 mL via INTRAVENOUS

## 2020-09-08 MED ORDER — ALLOPURINOL 100 MG PO TABS
100.0000 mg | ORAL_TABLET | Freq: Every evening | ORAL | Status: DC
  Administered 2020-09-08 – 2020-10-03 (×22): 100 mg via ORAL
  Filled 2020-09-08 (×23): qty 1

## 2020-09-08 MED ORDER — IRBESARTAN 300 MG PO TABS
300.0000 mg | ORAL_TABLET | Freq: Every day | ORAL | Status: DC
  Administered 2020-09-09 – 2020-09-12 (×4): 300 mg via ORAL
  Filled 2020-09-08 (×4): qty 1

## 2020-09-08 MED ORDER — OXYCODONE HCL 5 MG PO TABS: 15.0000 mg | ORAL_TABLET | Freq: Three times a day (TID) | ORAL | Status: DC | PRN

## 2020-09-08 MED ORDER — ALBUTEROL SULFATE (2.5 MG/3ML) 0.083% IN NEBU
INHALATION_SOLUTION | RESPIRATORY_TRACT | Status: AC
  Administered 2020-09-08: 5 mg via RESPIRATORY_TRACT
  Filled 2020-09-08: qty 6

## 2020-09-08 MED ORDER — PANTOPRAZOLE SODIUM 40 MG PO TBEC
40.0000 mg | DELAYED_RELEASE_TABLET | Freq: Every day | ORAL | Status: DC
  Administered 2020-09-08 – 2020-09-12 (×5): 40 mg via ORAL
  Filled 2020-09-08 (×5): qty 1

## 2020-09-08 MED ORDER — FOLIC ACID 1 MG PO TABS
1.0000 mg | ORAL_TABLET | Freq: Every day | ORAL | Status: DC
  Administered 2020-09-09 – 2020-10-04 (×23): 1 mg via ORAL
  Filled 2020-09-08 (×24): qty 1

## 2020-09-08 MED ORDER — ALBUTEROL SULFATE (2.5 MG/3ML) 0.083% IN NEBU: 2.5000 mg | INHALATION_SOLUTION | Freq: Once | RESPIRATORY_TRACT | Status: AC

## 2020-09-08 MED ORDER — SACCHAROMYCES BOULARDII 250 MG PO CAPS
250.0000 mg | ORAL_CAPSULE | Freq: Every day | ORAL | Status: DC
  Administered 2020-09-09 – 2020-10-04 (×23): 250 mg via ORAL
  Filled 2020-09-08 (×25): qty 1

## 2020-09-08 MED ORDER — VITAMIN D 25 MCG (1000 UNIT) PO TABS
1000.0000 [IU] | ORAL_TABLET | Freq: Every evening | ORAL | Status: DC
  Administered 2020-09-08 – 2020-10-03 (×22): 1000 [IU] via ORAL
  Filled 2020-09-08 (×23): qty 1

## 2020-09-08 MED ORDER — LINAGLIPTIN 5 MG PO TABS
5.0000 mg | ORAL_TABLET | Freq: Every day | ORAL | Status: DC
  Administered 2020-09-09 – 2020-09-12 (×4): 5 mg via ORAL
  Filled 2020-09-08 (×4): qty 1

## 2020-09-08 MED ORDER — METHYLPREDNISOLONE SODIUM SUCC 125 MG IJ SOLR
60.0000 mg | Freq: Once | INTRAMUSCULAR | Status: AC
  Administered 2020-09-09: 60 mg via INTRAVENOUS
  Filled 2020-09-08: qty 2

## 2020-09-08 MED ORDER — NITROGLYCERIN IN D5W 200-5 MCG/ML-% IV SOLN
0.0000 ug/min | INTRAVENOUS | Status: DC
  Administered 2020-09-08: 5 ug/min via INTRAVENOUS
  Filled 2020-09-08: qty 250

## 2020-09-08 MED ORDER — CINACALCET HCL 30 MG PO TABS
30.0000 mg | ORAL_TABLET | ORAL | Status: DC
  Administered 2020-09-18: 30 mg via ORAL
  Filled 2020-09-08 (×3): qty 1

## 2020-09-08 MED ORDER — POLYVINYL ALCOHOL 1.4 % OP SOLN
1.0000 [drp] | Freq: Three times a day (TID) | OPHTHALMIC | Status: DC
  Administered 2020-09-10 – 2020-10-04 (×42): 1 [drp] via OPHTHALMIC
  Filled 2020-09-08 (×2): qty 15

## 2020-09-08 MED ORDER — ALBUTEROL SULFATE (2.5 MG/3ML) 0.083% IN NEBU: 5.0000 mg | INHALATION_SOLUTION | Freq: Once | RESPIRATORY_TRACT | Status: AC

## 2020-09-08 MED ORDER — NITROGLYCERIN 2 % TD OINT
1.0000 [in_us] | TOPICAL_OINTMENT | Freq: Once | TRANSDERMAL | Status: AC
  Administered 2020-09-08: 1 [in_us] via TOPICAL
  Filled 2020-09-08: qty 1

## 2020-09-08 MED ORDER — NICOTINE 7 MG/24HR TD PT24
7.0000 mg | MEDICATED_PATCH | Freq: Every day | TRANSDERMAL | Status: DC
  Administered 2020-09-09 – 2020-09-11 (×3): 7 mg via TRANSDERMAL
  Filled 2020-09-08 (×6): qty 1

## 2020-09-08 MED ORDER — LABETALOL HCL 200 MG PO TABS
400.0000 mg | ORAL_TABLET | Freq: Two times a day (BID) | ORAL | Status: DC
  Administered 2020-09-08 – 2020-09-09 (×2): 400 mg via ORAL
  Filled 2020-09-08 (×2): qty 2

## 2020-09-08 MED ORDER — CYCLOBENZAPRINE HCL 10 MG PO TABS
10.0000 mg | ORAL_TABLET | Freq: Three times a day (TID) | ORAL | Status: DC | PRN
  Administered 2020-09-08 – 2020-09-11 (×3): 10 mg via ORAL
  Filled 2020-09-08 (×3): qty 1

## 2020-09-08 MED ORDER — SODIUM CHLORIDE 0.9 % IV SOLN: 250.0000 mL | INTRAVENOUS | Status: DC | PRN

## 2020-09-08 MED ORDER — ACETAMINOPHEN 500 MG PO TABS
1000.0000 mg | ORAL_TABLET | Freq: Four times a day (QID) | ORAL | Status: DC | PRN
  Administered 2020-09-09 – 2020-09-10 (×3): 1000 mg via ORAL
  Filled 2020-09-08 (×3): qty 2

## 2020-09-08 MED ORDER — CHLORHEXIDINE GLUCONATE CLOTH 2 % EX PADS
6.0000 | MEDICATED_PAD | Freq: Every day | CUTANEOUS | Status: DC
  Administered 2020-09-09 – 2020-09-12 (×4): 6 via TOPICAL

## 2020-09-08 MED ORDER — SODIUM CHLORIDE 0.9% FLUSH: 3.0000 mL | INTRAVENOUS | Status: DC | PRN

## 2020-09-08 MED ORDER — LABETALOL HCL 5 MG/ML IV SOLN
20.0000 mg | Freq: Once | INTRAVENOUS | Status: AC
  Administered 2020-09-08: 20 mg via INTRAVENOUS
  Filled 2020-09-08: qty 4

## 2020-09-08 MED ORDER — CALCIUM ACETATE (PHOS BINDER) 667 MG PO CAPS
2001.0000 mg | ORAL_CAPSULE | Freq: Three times a day (TID) | ORAL | Status: DC
  Administered 2020-09-09 – 2020-09-19 (×20): 2001 mg via ORAL
  Filled 2020-09-08 (×20): qty 3

## 2020-09-08 MED ORDER — HEPARIN (PORCINE) 25000 UT/250ML-% IV SOLN
2230.0000 [IU]/h | INTRAVENOUS | Status: DC
  Administered 2020-09-08: 1000 [IU]/h via INTRAVENOUS
  Administered 2020-09-09: 1150 [IU]/h via INTRAVENOUS
  Administered 2020-09-10: 1300 [IU]/h via INTRAVENOUS
  Filled 2020-09-08 (×4): qty 250

## 2020-09-08 NOTE — Progress Notes (Signed)
Troponin level on the rise 40>126>330. Paged cardiology x2, no response.  Patient at dialysis, ordered repeat EKG and morning troponin.  Continue heparin drip tonight.

## 2020-09-08 NOTE — Progress Notes (Signed)
Patient wanted a break from BIPAP and said she was breathing easier after HD. Placed patient on nasal cannula at 3lpm with Sp02=97%. Will continue to monitor patient.

## 2020-09-08 NOTE — Procedures (Signed)
   I was present at this dialysis session, have reviewed the session itself and made  appropriate changes Kelly Splinter MD [place] pager [phone]   09/08/2020, 8:05 PM

## 2020-09-08 NOTE — ED Triage Notes (Signed)
Awoke this a.m. with pain all over. She states she has had an episode like this before in Feb. and it was caused by a medication:  Cinacalcet.  Her nephrologist stopped the medicine temporarily because it caused a calcium imbalance that was the reason for her pain.  There restarted the medicine back once her labs WDL.

## 2020-09-08 NOTE — Consult Note (Signed)
Renal Service Consult Note [facility]  Jodi Bell 09/08/2020 Jodi Blazing, MD Requesting Physician: Dr. Roosevelt Bell  Reason for Consult: ESRD pt w/ resp distress HPI: The patient is a 60 y.o. year-old w/ hx of ESRD on HD, PAD, COPD, DM2, HTN on 5 bp meds presented to ED w/ SOB today. She had full HD yest, she was "below my dry weight" prior to starting.  Not trying to lose weight. No fever or sputum production. Asked to see for dialysis.    Pt reportedly was in some resp distress, but w/ bipap the resp effort is now back to baseline. Pt deniens any CP, fevers, abd pain , no n/v/d. Has not missed HD.    ROS - denies CP, no joint pain, no HA, no blurry vision, no rash, no diarrhea, no nausea/ vomiting, no dysuria, no difficulty voiding   Past Medical History  Past Medical History:  Diagnosis Date   Anemia of chronic disease    Anxiety    Aortic stenosis    Arthritis    oa and psoriatic ra   Asthma    Back pain, chronic    lower back   Candida infection finished tx 2 days ago   Chronic insomnia    COPD (chronic obstructive pulmonary disease) (HCC)    Coronary artery calcification seen on CT scan    Diabetes mellitus    type 2   Eczema    ESRD on dialysis [facility])    Essential hypertension    GERD (gastroesophageal reflux disease)    Gout    History of hiatal hernia    Hypoalbuminemia    Hyponatremia    Mitral stenosis    Obesity    PAD (peripheral artery disease) ([place])    a. externial iliac calcification seen on CT 04/2020.   Pneumonia few yrs ago x 2   Sleep apnea    Tobacco abuse    Upper GI bleed 05/2019   Past Surgical History  Past Surgical History:  Procedure Laterality Date   BACK SURGERY  2016   lower back fusion with cage   BIOPSY  09/23/2017   Procedure: BIOPSY;  Surgeon: Wonda Horner, MD;  Location: [facility];  Service: Endoscopy;;   BIOPSY  05/19/2019   Procedure: BIOPSY;  Surgeon: Otis Brace, MD;  Location: [place];   Service: Gastroenterology;;   CESAREAN SECTION  1990   x 1    COLONOSCOPY WITH PROPOFOL N/A 09/23/2017   Procedure: COLONOSCOPY WITH PROPOFOL Hemostatic clips placed;  Surgeon: Wonda Horner, MD;  Location: [facility];  Service: Endoscopy;  Laterality: N/A;   DILATION AND CURETTAGE OF UTERUS  1988   ESOPHAGOGASTRODUODENOSCOPY (EGD) WITH PROPOFOL N/A 09/23/2017   Procedure: ESOPHAGOGASTRODUODENOSCOPY (EGD) WITH PROPOFOL;  Surgeon: Wonda Horner, MD;  Location: [facility];  Service: Endoscopy;  Laterality: N/A;   ESOPHAGOGASTRODUODENOSCOPY (EGD) WITH PROPOFOL N/A 05/19/2019   Procedure: ESOPHAGOGASTRODUODENOSCOPY (EGD) WITH PROPOFOL;  Surgeon: Otis Brace, MD;  Location: [place];  Service: Gastroenterology;  Laterality: N/A;   GIVENS CAPSULE STUDY N/A 05/19/2019   Procedure: GIVENS CAPSULE STUDY;  Surgeon: Otis Brace, MD;  Location: [place];  Service: Gastroenterology;  Laterality: N/A;   HOT HEMOSTASIS N/A 05/19/2019   Procedure: HOT HEMOSTASIS (ARGON PLASMA COAGULATION/BICAP);  Surgeon: Otis Brace, MD;  Location: [facility] ENDOSCOPY;  Service: Gastroenterology;  Laterality: N/A;   PARTIAL KNEE ARTHROPLASTY Left 11/12/2017   Procedure: left unicompartmental arthroplasty-medial;  Surgeon: Paralee Cancel, MD;  Location: [facility];  Service:  Orthopedics;  Laterality: Left;  9mn   SUBMUCOSAL INJECTION  09/23/2017   Procedure: SUBMUCOSAL INJECTION;  Surgeon: GWonda Horner MD;  Location: [facility];  Service: Endoscopy;;  in colon   TUBAL LIGATION  1990   Family History  Family History  Problem Relation Age of Onset   Lung cancer Mother    Diabetes Sister    Heart disease Sister    Asthma Daughter    Arthritis Daughter    Arthritis Daughter    Thyroid cancer Other        1/2 sister   Heart disease Other        1/2 sister   Social History  reports that she has been smoking cigarettes. She has a 20.00 pack-year smoking history. She has never used smokeless tobacco. She  reports current alcohol use. She reports that she does not use drugs. Allergies  Allergies  Allergen Reactions   3-Methyl-2-Benzothiazolinone Hydrazone Other (See Comments)    Muscle weakness muscle cramping in legs Other reaction(s): Myalgias (Muscle Pain)   Banana Anaphylaxis and Swelling    TONGUE AND MOUTH SWELLING   Black Walnut Flavor Anaphylaxis    Walnuts   Hazelnut ([place]) Allergy Skin Test Anaphylaxis    Hazelnuts   Leflunomide And Related Other (See Comments)    Severe Headache Other reaction(s): Headache Severe Headache Severe Headache     Lisinopril Swelling    Angioedema  Swelling of the face Angioedema  Swelling of the face Other reaction(s): Facial Swelling    No [employer] Food Allergies Anaphylaxis    Spicy Mustard 4.7.2021 Pt reports that she eats Regular Yellow Mustard   Other Other (See Comments), Anaphylaxis and Swelling    Cosentyx= angioedema  Other reaction(s): Facial Edema (intolerance) Mouth itches and tongue swelling Hazel nuts and pecans also Walnuts Walnuts Renal failure Other reaction(s): Other Serotonin Syndrome  Serotonin syndrome  Tremors Other reaction(s): Other Tremors Muscle weakness muscle cramping in legs Other reaction(s): Myalgias (Muscle Pain) Mouth itches and tongue swelling Hazel nuts and pecans also Walnuts    Pecan Extract Allergy Skin Test Anaphylaxis    Pecans Pecans Pecans    Pecan Nut (Diagnostic) Anaphylaxis    Pecans   Trazodone And Nefazodone Other (See Comments)    Serotonin Syndrome  Serotonin syndrome  Serotonin Syndrome  Serotonin syndrome  Tremors Other reaction(s): Other Tremors    Adalimumab Other (See Comments)    Blisters on abdomen =humira Blisters on abdomen =humira Blisters on abdomen =humira   Escitalopram Oxalate Other (See Comments)    Hand problems - Serotonin Syndrome  Hand problems - Serotonin Syndrome  Hand problems - Serotonin Syndrome  Hand problems    Ezetimibe  Other (See Comments)    myalgias cramps myalgias cramps    Secukinumab Swelling    Cosentyx = angiodema Cosentyx = angiodema   Statins Other (See Comments)    Leg pains and weakness Leg pains and weakness   Ibuprofen Other (See Comments)    Renal Problems   Home medications Prior to Admission medications   Medication Sig Start Date End Date Taking? Authorizing Provider  acetaminophen (TYLENOL) 500 MG tablet Take 1,000 mg by mouth every 6 (six) hours as needed for mild pain.    [provider]  albuterol (PROVENTIL) (2.5 MG/3ML) 0.083% nebulizer solution Take 2.5 mg by nebulization as needed for wheezing or shortness of breath. 04/15/19   [provider]  allopurinol (ZYLOPRIM) 100 MG tablet Take 100 mg by mouth every evening.  [provider]  amLODipine (NORVASC) 10 MG tablet Take 10 mg by mouth daily.    [provider]  calcium acetate (PHOSLO) 667 MG capsule Take 3 capsules by mouth in the morning and at bedtime. Take 2 capsules with snacks. 10/25/18   [provider]  carboxymethylcellulose 1 % ophthalmic solution Place 1 drop into both eyes 4 (four) times daily.    [provider]  cetirizine (ZYRTEC) 10 MG tablet Take 10 mg by mouth 2 (two) times daily as needed (hives).  06/13/17   Shelda Pal, DO  Cholecalciferol (VITAMIN D3) 10 MCG (400 UNIT) tablet Take 400 Units by mouth every evening.    [provider]  cinacalcet (SENSIPAR) 30 MG tablet Take 30 mg by mouth 3 (three) times a week. Tuesday, Thursday, Saturday.    [provider]  cloNIDine (CATAPRES) 0.3 MG tablet Take 1 tablet (0.3 mg total) by mouth 3 (three) times daily. 05/21/19   Hosie Poisson, MD  cyclobenzaprine (FLEXERIL) 10 MG tablet Take 10 mg by mouth 3 (three) times daily as needed for muscle spasms. 06/08/20   [provider]  ELDERBERRY PO Take 2 drops by mouth daily.    [provider]  EPINEPHrine (EPIPEN) 0.3  mg/0.3 mL SOAJ injection Inject 0.3 mLs (0.3 mg total) into the muscle once. Patient taking differently: Inject 0.3 mg into the muscle as needed (for allergic reaction). 09/30/12   Le, Thao P, DO  ferrous sulfate 325 (65 FE) MG tablet Take 325 mg by mouth 3 (three) times a week. TUE THUR SAT    [provider]  fluticasone (FLONASE) 50 MCG/ACT nasal spray Place 1 spray into both nostrils daily.    [provider]  folic acid (FOLVITE) [id number] MCG tablet Take 400 mcg by mouth daily.     [provider]  furosemide (LASIX) 40 MG tablet Take 40 mg by mouth 2 (two) times daily.     [provider]  gabapentin (NEURONTIN) 300 MG capsule Take 1 capsule (300 mg total) by mouth at bedtime as needed (Neuropathic pain). Patient taking differently: Take 300 mg by mouth as needed (pain). 12/14/17 10/20/20  Dana Allan I, MD  hydrALAZINE (APRESOLINE) 100 MG tablet Take 1 tablet (100 mg total) by mouth every 8 (eight) hours. 07/06/20   [name], MD  isosorbide mononitrate (IMDUR) 60 MG 24 hr tablet Take 1 tablet (60 mg total) by mouth daily. 03/02/18   Thurnell Lose, MD  JANUVIA 25 MG tablet Take 1 tablet (25 mg total) by mouth daily. 07/06/20   [name], MD  labetalol (NORMODYNE) 200 MG tablet Take 400 mg by mouth 3 (three) times daily.    [provider]  LORazepam (ATIVAN) 1 MG tablet Take 1 mg by mouth every 8 (eight) hours as needed for anxiety. 06/25/20   [provider]  montelukast (SINGULAIR) 10 MG tablet Take 10 mg by mouth daily with supper.     [provider]  multivitamin (RENA-VIT) TABS tablet Take 1 tablet by mouth daily with supper.    [provider]  nitroGLYCERIN (NITROSTAT) 0.4 MG SL tablet Place 0.4 mg under the tongue every 5 (five) minutes as needed for chest pain. 08/01/20   [provider]  olmesartan (BENICAR) 40 MG tablet Take 40 mg by mouth every evening. 05/06/19   [provider]   oxyCODONE (ROXICODONE) 15 MG immediate release tablet Take 15 mg by mouth 3 (three) times daily as needed for  pain. 05/31/20   [provider]  pantoprazole (PROTONIX) 40 MG tablet Take 40 mg by mouth daily. 06/09/20   [provider]  predniSONE (DELTASONE) 5 MG tablet Take 1 tablet (5 mg total) by mouth daily with breakfast. 07/06/20   [name], MD  Probiotic Product (PROBIOTIC PO) Take 1 capsule by mouth daily.    [provider]  Tiotropium Bromide Monohydrate (SPIRIVA RESPIMAT IN) Inhale 2 puffs into the lungs daily.    [provider]  VENTOLIN HFA 108 (90 Base) MCG/ACT inhaler Inhale 1-2 puffs into the lungs every 6 (six) hours as needed for shortness of breath. wheezing 06/13/15   [provider]  XELJANZ 5 MG TABS Take 5 mg by mouth at bedtime. 05/17/19   [provider]  zolpidem (AMBIEN) 5 MG tablet Take 5 mg by mouth at bedtime. 05/05/19   [provider]     Vitals:   09/08/20 1743 09/08/20 1758 09/08/20 1815 09/08/20 1915  BP: (!) 191/97  (!) 183/111 (!) 198/93  Pulse: 90  89 84  Resp: 19  (!) 28 20  Temp: 98.1 F (36.7 C)   98.2 F (36.8 C)  TempSrc: Oral   Oral  SpO2: 99% 99% 98% 100%  Weight:      Height:       Exam Gen alert, no distress, on bipap, no use of acc muscles No rash, cyanosis or gangrene Sclera anicteric, throat clear  No jvd or bruits Chest clear bilat to bases, no rales/ wheezing RRR no MRG Abd soft ntnd no mass or ascites +bs GU normal MS no joint effusions or deformity Ext 1+ LE edema, no wounds or ulcers Neuro is alert, Ox 3 , nf  LUE AVF+bruit    Home meds include phoslo 3 ac tid, norvasc, allopurinol, sensipar 30 tiw mwf hd, capares 0.3 tid, lasix 40, neurontin 300 hs prn/ hydralazine 100 tid, imdur 60, januvia, labetalol 400 tid, singulair, renavit, sl ntg prn, olmesartan 300 hs, oxy IR prn, protonix, prednisone 5 qd, spiriva respimat, venotolin hfa, xeljanz 5 hs,  prn's   CXR - IMPRESSION: Cardiomegaly with moderate interstitial edema, slightly increased.   OP HD: MWF [facility]  3.5h  2 /2.5 bath  dry wt pend   Hep none   LUA AVF  400/700   na 134  K 4.4 CO2 29   BUN 18  Cr 3.8  Assessment Resp distress/ acute hypoxic resp failure - due to vol overload most likely. May be losing body weight.  ESRD - on HD MWF at [facility] HTN - severe, on 5 BP lowering meds at home Anemia - Hb 9.2, follow COPD Gout PAD   Plan - acute HD urgently tonight, UF 4-5 L as tolerated. Wean bipap off as tolerated. Get good wt's post HD.      Kelly Splinter  MD 09/08/2020, 7:53 PM  Recent Labs  Lab 09/08/20 1221  WBC 7.1  HGB 9.2*   Recent Labs  Lab 09/08/20 1221  K 4.4  BUN 18  CREATININE 3.86*  CALCIUM 10.3

## 2020-09-08 NOTE — ED Triage Notes (Signed)
Pt presented to free standing ED with c/o SOB and CP that started this morning. PT states she felt fine at dialysis yesterday and completed her session. PT was transferred to [facility] after elevated BP and trop. Pt transferred with Nitro drip and heparin drip infusing

## 2020-09-08 NOTE — H&P (Addendum)
History and Physical    Jodi Bell [id number] DOB: 1961/01/08 DOA: 09/08/2020  PCP: [name]., MD (Confirm with patient/family/NH records and if not entered, this has to be entered at [facility] point of entry) Patient coming from: [facility]  I have personally briefly reviewed patient's old medical records in [place]  Chief Complaint: SOB  HPI: Jodi Bell is a 60 y.o. female with medical history significant of ESRD on HD MWF, refractory HTN, chronic diastolic CHF, IIDM, diabetic neuropathy, COPD, cigarette smoker, RA on chronic steroid, CAD, PAD, chronic anemia presented with sudden onset of shortness of breath.  Patient underwent routine hemodialysis yesterday, and came home okay.  Patient states she has been careful of her fluid intake she used to only drink 32 ounces a day, does not use any salt to cook.  Overnight she slept well, she woke up this morning feeling okay but after a mild exercise she became severe shortness of breath.  No cough no fever chills no chest pains.  Patient also reported that she has been compliant with all her home BP medications, she smokes 7 to 8 cigarettes a day.  Patient also said that recently she has been taking 5 mg prednisone daily for her rheumatoid arthritis chronically.  ED Course: Patient was found to be in severe respiratory distress, tachypneic.  Blood pressure significantly elevated with systolic [id number] to [id number].  Chest x-ray shows severe fluid overload and pulmonary edema.  Troponins 40> 126, EKG pending, patient has no chest pains.  ED started patient on heparin drip and nitroglycerin drip and transferred to [facility] for further management.  Review of Systems: As per HPI otherwise 14 point review of systems negative.    Past Medical History:  Diagnosis Date   Anemia of chronic disease    Anxiety    Aortic stenosis    Arthritis    oa and psoriatic ra   Asthma    Back pain, chronic    lower back   Candida infection finished  tx 2 days ago   Chronic insomnia    COPD (chronic obstructive pulmonary disease) (HCC)    Coronary artery calcification seen on CT scan    Diabetes mellitus    type 2   Eczema    ESRD on dialysis [facility])    Essential hypertension    GERD (gastroesophageal reflux disease)    Gout    History of hiatal hernia    Hypoalbuminemia    Hyponatremia    Mitral stenosis    Obesity    PAD (peripheral artery disease) ([place])    a. externial iliac calcification seen on CT 04/2020.   Pneumonia few yrs ago x 2   Sleep apnea    Tobacco abuse    Upper GI bleed 05/2019    Past Surgical History:  Procedure Laterality Date   BACK SURGERY  2016   lower back fusion with cage   BIOPSY  09/23/2017   Procedure: BIOPSY;  Surgeon: Wonda Horner, MD;  Location: [facility];  Service: Endoscopy;;   BIOPSY  05/19/2019   Procedure: BIOPSY;  Surgeon: Otis Brace, MD;  Location: [place];  Service: Gastroenterology;;   CESAREAN SECTION  1990   x 1    COLONOSCOPY WITH PROPOFOL N/A 09/23/2017   Procedure: COLONOSCOPY WITH PROPOFOL Hemostatic clips placed;  Surgeon: Wonda Horner, MD;  Location: [facility];  Service: Endoscopy;  Laterality: N/A;   DILATION AND CURETTAGE OF UTERUS  1988   ESOPHAGOGASTRODUODENOSCOPY (EGD)  WITH PROPOFOL N/A 09/23/2017   Procedure: ESOPHAGOGASTRODUODENOSCOPY (EGD) WITH PROPOFOL;  Surgeon: Wonda Horner, MD;  Location: [facility];  Service: Endoscopy;  Laterality: N/A;   ESOPHAGOGASTRODUODENOSCOPY (EGD) WITH PROPOFOL N/A 05/19/2019   Procedure: ESOPHAGOGASTRODUODENOSCOPY (EGD) WITH PROPOFOL;  Surgeon: Otis Brace, MD;  Location: [place];  Service: Gastroenterology;  Laterality: N/A;   GIVENS CAPSULE STUDY N/A 05/19/2019   Procedure: GIVENS CAPSULE STUDY;  Surgeon: Otis Brace, MD;  Location: [place];  Service: Gastroenterology;  Laterality: N/A;   HOT HEMOSTASIS N/A 05/19/2019   Procedure: HOT HEMOSTASIS (ARGON PLASMA COAGULATION/BICAP);  Surgeon:  Otis Brace, MD;  Location: [facility];  Service: Gastroenterology;  Laterality: N/A;   PARTIAL KNEE ARTHROPLASTY Left 11/12/2017   Procedure: left unicompartmental arthroplasty-medial;  Surgeon: Paralee Cancel, MD;  Location: [facility];  Service: Orthopedics;  Laterality: Left;  26mn   SUBMUCOSAL INJECTION  09/23/2017   Procedure: SUBMUCOSAL INJECTION;  Surgeon: GWonda Horner MD;  Location: [facility];  Service: Endoscopy;;  in colon   [place]    reports that she has been smoking cigarettes. She has a 20.00 pack-year smoking history. She has never used smokeless tobacco. She reports current alcohol use. She reports that she does not use drugs.  Allergies  Allergen Reactions   3-Methyl-2-Benzothiazolinone Hydrazone Other (See Comments)    Muscle weakness muscle cramping in legs Other reaction(s): Myalgias (Muscle Pain)   Banana Anaphylaxis and Swelling    TONGUE AND MOUTH SWELLING   Black Walnut Flavor Anaphylaxis    Walnuts   Hazelnut ([place]) Allergy Skin Test Anaphylaxis    Hazelnuts   Leflunomide And Related Other (See Comments)    Severe Headache Other reaction(s): Headache Severe Headache Severe Headache     Lisinopril Swelling    Angioedema  Swelling of the face Angioedema  Swelling of the face Other reaction(s): Facial Swelling    No Healthtouch Food Allergies Anaphylaxis    Spicy Mustard 4.7.2021 Pt reports that she eats Regular Yellow Mustard   Other Other (See Comments), Anaphylaxis and Swelling    Cosentyx= angioedema  Other reaction(s): Facial Edema (intolerance) Mouth itches and tongue swelling Hazel nuts and pecans also Walnuts Walnuts Renal failure Other reaction(s): Other Serotonin Syndrome  Serotonin syndrome  Tremors Other reaction(s): Other Tremors Muscle weakness muscle cramping in legs Other reaction(s): Myalgias (Muscle Pain) Mouth itches and tongue swelling Hazel nuts and pecans also Walnuts    Pecan Extract  Allergy Skin Test Anaphylaxis    Pecans Pecans Pecans    Pecan Nut (Diagnostic) Anaphylaxis    Pecans   Trazodone And Nefazodone Other (See Comments)    Serotonin Syndrome  Serotonin syndrome  Serotonin Syndrome  Serotonin syndrome  Tremors Other reaction(s): Other Tremors    Adalimumab Other (See Comments)    Blisters on abdomen =humira Blisters on abdomen =humira Blisters on abdomen =humira   Escitalopram Oxalate Other (See Comments)    Hand problems - Serotonin Syndrome  Hand problems - Serotonin Syndrome  Hand problems - Serotonin Syndrome  Hand problems    Ezetimibe Other (See Comments)    myalgias cramps myalgias cramps    Secukinumab Swelling    Cosentyx = angiodema Cosentyx = angiodema   Statins Other (See Comments)    Leg pains and weakness Leg pains and weakness   Ibuprofen Other (See Comments)    Renal Problems    Family History  Problem Relation Age of Onset   Lung cancer Mother    Diabetes Sister  Heart disease Sister    Asthma Daughter    Arthritis Daughter    Arthritis Daughter    Thyroid cancer Other        1/2 sister   Heart disease Other        1/2 sister     Prior to Admission medications   Medication Sig Start Date End Date Taking? Authorizing Provider  acetaminophen (TYLENOL) 500 MG tablet Take 1,000 mg by mouth every 6 (six) hours as needed for mild pain.    [provider]  albuterol (PROVENTIL) (2.5 MG/3ML) 0.083% nebulizer solution Take 2.5 mg by nebulization as needed for wheezing or shortness of breath. 04/15/19   [provider]  allopurinol (ZYLOPRIM) 100 MG tablet Take 100 mg by mouth every evening.    [provider]  amLODipine (NORVASC) 10 MG tablet Take 10 mg by mouth daily.    [provider]  calcium acetate (PHOSLO) 667 MG capsule Take 3 capsules by mouth in the morning and at bedtime. Take 2 capsules with snacks. 10/25/18   [provider]  carboxymethylcellulose 1 %  ophthalmic solution Place 1 drop into both eyes 4 (four) times daily.    [provider]  cetirizine (ZYRTEC) 10 MG tablet Take 10 mg by mouth 2 (two) times daily as needed (hives).  06/13/17   Shelda Pal, DO  Cholecalciferol (VITAMIN D3) 10 MCG (400 UNIT) tablet Take 400 Units by mouth every evening.    [provider]  cinacalcet (SENSIPAR) 30 MG tablet Take 30 mg by mouth 3 (three) times a week. Tuesday, Thursday, Saturday.    [provider]  cloNIDine (CATAPRES) 0.3 MG tablet Take 1 tablet (0.3 mg total) by mouth 3 (three) times daily. 05/21/19   Hosie Poisson, MD  cyclobenzaprine (FLEXERIL) 10 MG tablet Take 10 mg by mouth 3 (three) times daily as needed for muscle spasms. 06/08/20   [provider]  ELDERBERRY PO Take 2 drops by mouth daily.    [provider]  EPINEPHrine (EPIPEN) 0.3 mg/0.3 mL SOAJ injection Inject 0.3 mLs (0.3 mg total) into the muscle once. Patient taking differently: Inject 0.3 mg into the muscle as needed (for allergic reaction). 09/30/12   Le, Thao P, DO  ferrous sulfate 325 (65 FE) MG tablet Take 325 mg by mouth 3 (three) times a week. TUE THUR SAT    [provider]  fluticasone (FLONASE) 50 MCG/ACT nasal spray Place 1 spray into both nostrils daily.    [provider]  folic acid (FOLVITE) [id number] MCG tablet Take 400 mcg by mouth daily.     [provider]  furosemide (LASIX) 40 MG tablet Take 40 mg by mouth 2 (two) times daily.     [provider]  gabapentin (NEURONTIN) 300 MG capsule Take 1 capsule (300 mg total) by mouth at bedtime as needed (Neuropathic pain). Patient taking differently: Take 300 mg by mouth as needed (pain). 12/14/17 10/20/20  Dana Allan I, MD  hydrALAZINE (APRESOLINE) 100 MG tablet Take 1 tablet (100 mg total) by mouth every 8 (eight) hours. 07/06/20   [name], MD  isosorbide mononitrate (IMDUR) 60 MG 24 hr tablet Take 1 tablet (60 mg total) by  mouth daily. 03/02/18   Thurnell Lose, MD  JANUVIA 25 MG tablet Take 1 tablet (25 mg total) by mouth daily. 07/06/20   [name], MD  labetalol (NORMODYNE) 200 MG tablet Take 400 mg by mouth 3 (three) times daily.  [provider]  LORazepam (ATIVAN) 1 MG tablet Take 1 mg by mouth every 8 (eight) hours as needed for anxiety. 06/25/20   [provider]  montelukast (SINGULAIR) 10 MG tablet Take 10 mg by mouth daily with supper.     [provider]  multivitamin (RENA-VIT) TABS tablet Take 1 tablet by mouth daily with supper.    [provider]  nitroGLYCERIN (NITROSTAT) 0.4 MG SL tablet Place 0.4 mg under the tongue every 5 (five) minutes as needed for chest pain. 08/01/20   [provider]  olmesartan (BENICAR) 40 MG tablet Take 40 mg by mouth every evening. 05/06/19   [provider]  oxyCODONE (ROXICODONE) 15 MG immediate release tablet Take 15 mg by mouth 3 (three) times daily as needed for pain. 05/31/20   [provider]  pantoprazole (PROTONIX) 40 MG tablet Take 40 mg by mouth daily. 06/09/20   [provider]  predniSONE (DELTASONE) 5 MG tablet Take 1 tablet (5 mg total) by mouth daily with breakfast. 07/06/20   [name], MD  Probiotic Product (PROBIOTIC PO) Take 1 capsule by mouth daily.    [provider]  Tiotropium Bromide Monohydrate (SPIRIVA RESPIMAT IN) Inhale 2 puffs into the lungs daily.    [provider]  VENTOLIN HFA 108 (90 Base) MCG/ACT inhaler Inhale 1-2 puffs into the lungs every 6 (six) hours as needed for shortness of breath. wheezing 06/13/15   [provider]  XELJANZ 5 MG TABS Take 5 mg by mouth at bedtime. 05/17/19   [provider]  zolpidem (AMBIEN) 5 MG tablet Take 5 mg by mouth at bedtime. 05/05/19   [provider]    Physical Exam: Vitals:   09/08/20 1600 09/08/20 1629 09/08/20 1743 09/08/20 1758  BP: (!) 186/96  (!) 191/97   Pulse: 85   90   Resp: 16  19   Temp:   98.1 F (36.7 C)   TempSrc:   Oral   SpO2: 97% 95% 99% 99%  Weight:      Height:        Constitutional: NAD, calm, comfortable Vitals:   09/08/20 1600 09/08/20 1629 09/08/20 1743 09/08/20 1758  BP: (!) 186/96  (!) 191/97   Pulse: 85  90   Resp: 16  19   Temp:   98.1 F (36.7 C)   TempSrc:   Oral   SpO2: 97% 95% 99% 99%  Weight:      Height:       Eyes: PERRL, lids and conjunctivae normal ENMT: Mucous membranes are moist. Posterior pharynx clear of any exudate or lesions.Normal dentition.  Neck: normal, supple, no masses, no thyromegaly Respiratory: Diminished breathing sound bilaterally, scattered wheezing, diffused crackles.  Increasing respiratory effort.  Positive signs of accessory muscle use.  Cardiovascular: Regular rate and rhythm, no murmurs / rubs / gallops.  2+ extremity edema. 2+ pedal pulses. No carotid bruits.  Abdomen: no tenderness, no masses palpated. No hepatosplenomegaly. Bowel sounds positive.  Musculoskeletal: no clubbing / cyanosis. No joint deformity upper and lower extremities. Good ROM, no contractures. Normal muscle tone.  Skin: no rashes, lesions, ulcers. No induration Neurologic: CN 2-12 grossly intact. Sensation intact, DTR normal. Strength 5/5 in all 4.  Psychiatric: Normal judgment and insight. Alert and oriented x 3. Normal mood.     Labs on Admission: I have personally reviewed following labs and imaging studies  CBC: Recent Labs  Lab 09/08/20 1221  WBC 7.1  NEUTROABS 4.7  HGB  9.2*  HCT 28.1*  MCV 101.1*  PLT 0000000   Basic Metabolic Panel: Recent Labs  Lab 09/08/20 1221  NA 134*  K 4.4  CL 97*  CO2 29  GLUCOSE 103*  BUN 18  CREATININE 3.86*  CALCIUM 10.3  MG 1.9   GFR: Estimated Creatinine Clearance: 14 mL/min (A) (by C-G formula based on SCr of 3.86 mg/dL (H)). Liver Function Tests: No results for input(s): AST, ALT, ALKPHOS, BILITOT, PROT, ALBUMIN in the last 168 hours. No results for  input(s): LIPASE, AMYLASE in the last 168 hours. No results for input(s): AMMONIA in the last 168 hours. Coagulation Profile: No results for input(s): INR, PROTIME in the last 168 hours. Cardiac Enzymes: No results for input(s): CKTOTAL, CKMB, CKMBINDEX, TROPONINI in the last 168 hours. BNP (last 3 results) No results for input(s): PROBNP in the last 8760 hours. HbA1C: No results for input(s): HGBA1C in the last 72 hours. CBG: No results for input(s): GLUCAP in the last 168 hours. Lipid Profile: No results for input(s): CHOL, HDL, LDLCALC, TRIG, CHOLHDL, LDLDIRECT in the last 72 hours. Thyroid Function Tests: No results for input(s): TSH, T4TOTAL, FREET4, T3FREE, THYROIDAB in the last 72 hours. Anemia Panel: No results for input(s): VITAMINB12, FOLATE, FERRITIN, TIBC, IRON, RETICCTPCT in the last 72 hours. Urine analysis:    Component Value Date/Time   COLORURINE YELLOW 08/17/2015 1621   APPEARANCEUR CLEAR 08/17/2015 1621   LABSPEC 1.015 08/17/2015 1621   PHURINE 5.5 08/17/2015 1621   GLUCOSEU NEGATIVE 08/17/2015 1621   HGBUR NEGATIVE 08/17/2015 1621   BILIRUBINUR NEGATIVE 08/17/2015 1621   KETONESUR NEGATIVE 08/17/2015 1621   PROTEINUR 100 (A) 08/17/2015 1621   UROBILINOGEN 0.2 09/05/2014 0013   NITRITE NEGATIVE 08/17/2015 1621   LEUKOCYTESUR NEGATIVE 08/17/2015 1621    Radiological Exams on Admission: DG Chest 2 View  Result Date: 09/08/2020 CLINICAL DATA:  Shortness of breath beginning this morning EXAM: CHEST - 2 VIEW COMPARISON:  08/22/2020 FINDINGS: Lateral view degraded by patient arm position. Cervical spine fixation. Midline trachea. Moderate cardiomegaly. Atherosclerosis in the transverse aorta. No pleural effusion or pneumothorax. Moderate interstitial edema is asymmetric, greater on the right. Felt to be slightly increased. IMPRESSION: Cardiomegaly with moderate interstitial edema, slightly increased. Aortic Atherosclerosis (ICD10-I70.0). Electronically Signed   By:  Abigail Miyamoto M.D.   On: 09/08/2020 13:25    EKG: Independently reviewed.  Sinus rhythm, no acute ST-T changes.  Assessment/Plan Active Problems:   Pulmonary edema   CHF (congestive heart failure) ([place])  (please populate well all problems here in Problem List. (For example, if patient is on BP meds at home and you resume or decide to hold them, it is a problem that needs to be her. Same for CAD, COPD, HLD and so on)  Acute on chronic diastolic CHF decompensation -With significant pulmonary edema -Ordered Lasix 80 mg twice daily starting now. -Informed Dr. Jonnie Finner for emergency HD tonightl. -Change nitroglycerin drip to nitro patch to avoid additional fluid overloading. -resume home BP meds -History of refractory hypertension, looks like patient was in the ED 2 weeks ago and has BP 200 and patient says that she checked her blood pressure at home occasionally most readings SBP> 160.  Will discuss with nephrology about 4 times a week dialysis versus additional BP meds.  But compliance will be an issue. -Echo was done in May, will not repeat this time. -BiPAP  HTN emergency -As above.  Acute COPD exacerbation -1 dose of IV Solu-Medrol, short course p.o. steroids starting  tomorrow.  Breathing treatment. -Educated to quit smoking.  Positive troponins -No significant chest pains, will repeat third and fourth set troponin, temporarily on heparin drip, if no significant spiking on third or fourth troponin, plans to discontinue heparin drip. -Overall suspect this is demand ischemia from hypertension emergency and fluid overload low/CHF decompensation.  ESRD on HD -Needs emergency dialysis tonight.  Nephrology aware and making arrangement for HD ASAP.  IIDM -Continue Januvia, add sliding scale.  CAD -As above.  Cigarette smoke -Nicotine patch.  DVT prophylaxis: Heparin drip Code Status: Full Code Family Communication: Son at bedside Disposition Plan: Expect more than 2 midnight  hospital stay to treat acute decompensation diastolic CHF and uncontrolled/refractory hypertension, likely will need additional dialysis. Consults called: Nephrology Admission status: PCU   Lequita Halt MD [facility] Pager [phone]  09/08/2020, 6:08 PM

## 2020-09-08 NOTE — Progress Notes (Signed)
ANTICOAGULATION CONSULT NOTE - Initial Consult  Pharmacy Consult for Heparin Indication: chest pain/ACS  Allergies  Allergen Reactions   3-Methyl-2-Benzothiazolinone Hydrazone Other (See Comments)    Muscle weakness muscle cramping in legs Other reaction(s): Myalgias (Muscle Pain)   Banana Anaphylaxis and Swelling    TONGUE AND MOUTH SWELLING   Black Walnut Flavor Anaphylaxis    Walnuts   Hazelnut ([place]) Allergy Skin Test Anaphylaxis    Hazelnuts   Leflunomide And Related Other (See Comments)    Severe Headache Other reaction(s): Headache Severe Headache Severe Headache     Lisinopril Swelling    Angioedema  Swelling of the face Angioedema  Swelling of the face Other reaction(s): Facial Swelling    No Healthtouch Food Allergies Anaphylaxis    Spicy Mustard 4.7.2021 Pt reports that she eats Regular Yellow Mustard   Other Other (See Comments), Anaphylaxis and Swelling    Cosentyx= angioedema  Other reaction(s): Facial Edema (intolerance) Mouth itches and tongue swelling Hazel nuts and pecans also Walnuts Walnuts Renal failure Other reaction(s): Other Serotonin Syndrome  Serotonin syndrome  Tremors Other reaction(s): Other Tremors Muscle weakness muscle cramping in legs Other reaction(s): Myalgias (Muscle Pain) Mouth itches and tongue swelling Hazel nuts and pecans also Walnuts    Pecan Extract Allergy Skin Test Anaphylaxis    Pecans Pecans Pecans    Pecan Nut (Diagnostic) Anaphylaxis    Pecans   Trazodone And Nefazodone Other (See Comments)    Serotonin Syndrome  Serotonin syndrome  Serotonin Syndrome  Serotonin syndrome  Tremors Other reaction(s): Other Tremors    Adalimumab Other (See Comments)    Blisters on abdomen =humira Blisters on abdomen =humira Blisters on abdomen =humira   Escitalopram Oxalate Other (See Comments)    Hand problems - Serotonin Syndrome  Hand problems - Serotonin Syndrome  Hand problems - Serotonin Syndrome   Hand problems    Ezetimibe Other (See Comments)    myalgias cramps myalgias cramps    Secukinumab Swelling    Cosentyx = angiodema Cosentyx = angiodema   Statins Other (See Comments)    Leg pains and weakness Leg pains and weakness   Ibuprofen Other (See Comments)    Renal Problems    Patient Measurements: Height: '5\' 2"'$  (157.5 cm) Weight: 68 kg (150 lb) IBW/kg (Calculated) : 50.1 Heparin Dosing Weight: 64.2  Vital Signs: Temp: 98 F (36.7 C) (07/30 1125) Temp Source: Oral (07/30 1125) BP: 184/90 (07/30 1516) Pulse Rate: 84 (07/30 1516)  Labs: Recent Labs    09/08/20 1221 09/08/20 1430  HGB 9.2*  --   HCT 28.1*  --   PLT 243  --   CREATININE 3.86*  --   TROPONINIHS 40* 126*    Estimated Creatinine Clearance: 14 mL/min (A) (by C-G formula based on SCr of 3.86 mg/dL (H)).   Medical History: Past Medical History:  Diagnosis Date   Anemia of chronic disease    Anxiety    Aortic stenosis    Arthritis    oa and psoriatic ra   Asthma    Back pain, chronic    lower back   Candida infection finished tx 2 days ago   Chronic insomnia    COPD (chronic obstructive pulmonary disease) (HCC)    Coronary artery calcification seen on CT scan    Diabetes mellitus    type 2   Eczema    ESRD on dialysis [facility])    Essential hypertension    GERD (gastroesophageal reflux disease)    Gout  History of hiatal hernia    Hypoalbuminemia    Hyponatremia    Mitral stenosis    Obesity    PAD (peripheral artery disease) (HCC)    a. externial iliac calcification seen on CT 04/2020.   Pneumonia few yrs ago x 2   Sleep apnea    Tobacco abuse    Upper GI bleed 05/2019    Medications:  (Not in a hospital admission)  Scheduled:  Infusions:   nitroGLYCERIN     Assessment: 44 yof presenting with SOB. Patient is ESRD on HD, last session 7/29. MD [place] requesting ED to ED transfer for emergent HD in the setting of fluid overload and HTN emergency.  Patient is not on  anticoagulation prior to arrival. HGb 9.2 (near baseline); plt 243.  Goal of Therapy:  Heparin level 0.3-0.7 units/ml Monitor platelets by anticoagulation protocol: Yes   Plan:  Start heparin infusion at 1000 units/hr Check anti-Xa level in 6-8 hours and daily while on heparin Continue to monitor H&H and platelets  Heloise Purpura 09/08/2020,3:52 PM

## 2020-09-08 NOTE — ED Provider Notes (Signed)
[place] EMERGENCY DEPARTMENT Provider Note   CSN: [id number] Arrival date & time: 09/08/20  1109     History No chief complaint on file.   Jodi Bell is a 60 y.o. female.  Patient with history of ESRD on hemodialysis, last dialyzed yesterday, anemia, COPD and asthma --presents to the emergency department for evaluation of shortness of breath.  She also states that she has had ongoing muscle pains.  She states that her muscles feel like they are "being ripped off the bone".  This has been ongoing.  This morning she awoke and swept her porch.  She sat down to watch a movie with family and develop progressively worsening shortness of breath.  She called her dialysis center and after speaking with them felt that she should come to the emergency department for work-up.  Pain is all over the upper body, including the chest.  He states that she has had pain like this in the past that she attributes to a medication, cinacalcet.  She denies infectious symptoms such as fever, chills, runny nose, cough.  No vomiting or diarrhea.  No reported bloody stools.  She does report some wheezing due to asthma. The onset of this condition was acute. The course is constant. Aggravating factors: none. Alleviating factors: none.        Past Medical History:  Diagnosis Date   Anemia of chronic disease    Anxiety    Aortic stenosis    Arthritis    oa and psoriatic ra   Asthma    Back pain, chronic    lower back   Candida infection finished tx 2 days ago   Chronic insomnia    COPD (chronic obstructive pulmonary disease) (HCC)    Coronary artery calcification seen on CT scan    Diabetes mellitus    type 2   Eczema    ESRD on dialysis [facility])    Essential hypertension    GERD (gastroesophageal reflux disease)    Gout    History of hiatal hernia    Hypoalbuminemia    Hyponatremia    Mitral stenosis    Obesity    PAD (peripheral artery disease) ([place])    a. externial iliac  calcification seen on CT 04/2020.   Pneumonia few yrs ago x 2   Sleep apnea    Tobacco abuse    Upper GI bleed 05/2019    Patient Active Problem List   Diagnosis Date Noted   Dyspnea 07/03/2020   GI bleeding 05/17/2019   Volume overload state of heart 09/14/2018   Intervertebral cervical disc disorder with myelopathy, cervical region 09/14/2018   OSA (obstructive sleep apnea) 09/14/2018   Asthma 05/16/2018   Suspected COVID-19 virus infection 05/16/2018   ESRD (end stage renal disease) ([place]) 05/16/2018   Fluid overload 02/27/2018   Chest pain, rule out acute myocardial infarction 12/12/2017   Status post left partial knee replacement 11/12/2017   Hematochezia 09/20/2017   GERD (gastroesophageal reflux disease) 09/20/2017   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 2 diabetes mellitus ([place]) 09/20/2017   COPD (chronic obstructive pulmonary disease) ([place]) 09/20/2017   Tobacco use 09/20/2017   Symptomatic anemia 09/20/2017   Headache 09/20/2017   Serotonin syndrome 09/28/2013   Acute respiratory failure with hypoxia ([place]) 09/28/2013   Encephalopathy acute 09/28/2013   Hypotension, unspecified 09/28/2013   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   Type 2 diabetes mellitus ([place]) 03/06/2008   INSOMNIA, CHRONIC 03/06/2008   Essential hypertension 03/06/2008   ASTHMA  03/06/2008   ECZEMA 03/06/2008   BACK PAIN, LUMBAR, CHRONIC 03/06/2008    Past Surgical History:  Procedure Laterality Date   BACK SURGERY  2016   lower back fusion with cage   BIOPSY  09/23/2017   Procedure: BIOPSY;  Surgeon: Wonda Horner, MD;  Location: [facility];  Service: Endoscopy;;   BIOPSY  05/19/2019   Procedure: BIOPSY;  Surgeon: Otis Brace, MD;  Location: [place];  Service: Gastroenterology;;   CESAREAN SECTION  1990   x 1    COLONOSCOPY WITH PROPOFOL N/A 09/23/2017   Procedure: COLONOSCOPY WITH PROPOFOL Hemostatic clips placed;  Surgeon: Wonda Horner, MD;  [facility]: [facility];  Service: Endoscopy;   Laterality: N/A;   DILATION AND CURETTAGE OF UTERUS  1988   ESOPHAGOGASTRODUODENOSCOPY (EGD) WITH PROPOFOL N/A 09/23/2017   Procedure: ESOPHAGOGASTRODUODENOSCOPY (EGD) WITH PROPOFOL;  Surgeon: Wonda Horner, MD;  [facility];  Service: Endoscopy;  Laterality: N/A;   ESOPHAGOGASTRODUODENOSCOPY (EGD) WITH PROPOFOL N/A 05/19/2019   Procedure: ESOPHAGOGASTRODUODENOSCOPY (EGD) WITH PROPOFOL;  Surgeon: Otis Brace, MD;  Location: [place];  Service: Gastroenterology;  Laterality: N/A;   GIVENS CAPSULE STUDY N/A 05/19/2019   Procedure: GIVENS CAPSULE STUDY;  Surgeon: Otis Brace, MD;  Location: [place];  Service: Gastroenterology;  Laterality: N/A;   HOT HEMOSTASIS N/A 05/19/2019   Procedure: HOT HEMOSTASIS (ARGON PLASMA COAGULATION/BICAP);  Surgeon: Otis Brace, MD;  Location: [facility] END[facility]COPY;  Service: Gastroenterology;  Laterality: N/A;   PARTIAL KNEE ARTHROPLASTY Left 11/12/2017   Procedure: left unicompartmental arthroplasty-medial;  Surgeon: Paralee Cancel, MD;  Location: [facility];  Service: Orthopedics;  Laterality: Left;  48mn   SUBMUCOSAL INJECTION  09/23/2017   Procedure: SUBMUCOSAL INJECTION;  Surgeon: GWonda Horner MD;  Location[facility];  Service: Endoscopy;;  in colon   [place]    OB History   No obstetric history on file.     Family History  Problem Relation Age of Onset   Lung cancer Mother    Diabetes Sister    Heart disease Sister    Asthma Daughter    Arthritis Daughter    Arthritis Daughter    Thyroid cancer Other        1/2 sister   Heart disease Other        1/2 sister    Social History   Tobacco Use   Smoking status: Every Day    Packs/day: 0.50    Years: 40.00    Pack years: 20.00    Types: Cigarettes   Smokeless tobacco: Never  Vaping Use   Vaping Use: Never used  Substance Use Topics   Alcohol use: Yes    Comment: occ   Drug use: No    Home Medications Prior to Admission medications   Medication  Sig Start Date End Date Taking? Authorizing Provider  acetaminophen (TYLENOL) 500 MG tablet Take 1,000 mg by mouth every 6 (six) hours as needed for mild pain.    [provider]  albuterol (PROVENTIL) (2.5 MG/3ML) 0.083% nebulizer solution Take 2.5 mg by nebulization as needed for wheezing or shortness of breath. 04/15/19   [provider]  allopurinol (ZYLOPRIM) 100 MG tablet Take 100 mg by mouth every evening.    [provider]  amLODipine (NORVASC) 10 MG tablet Take 10 mg by mouth daily.    [provider]  calcium acetate (PHOSLO) 667 MG capsule Take 3 capsules by mouth in the morning and at bedtime. Take 2 capsules with snacks.  10/25/18   [provider]  carboxymethylcellulose 1 % ophthalmic solution Place 1 drop into both eyes 4 (four) times daily.    [provider]  cetirizine (ZYRTEC) 10 MG tablet Take 10 mg by mouth 2 (two) times daily as needed (hives).  06/13/17   Shelda Pal, DO  Cholecalciferol (VITAMIN D3) 10 MCG (400 UNIT) tablet Take 400 Units by mouth every evening.    [provider]  cinacalcet (SENSIPAR) 30 MG tablet Take 30 mg by mouth 3 (three) times a week. Tuesday, Thursday, Saturday.    [provider]  cloNIDine (CATAPRES) 0.3 MG tablet Take 1 tablet (0.3 mg total) by mouth 3 (three) times daily. 05/21/19   Hosie Poisson, MD  cyclobenzaprine (FLEXERIL) 10 MG tablet Take 10 mg by mouth 3 (three) times daily as needed for muscle spasms. 06/08/20   [provider]  ELDERBERRY PO Take 2 drops by mouth daily.    [provider]  EPINEPHrine (EPIPEN) 0.3 mg/0.3 mL SOAJ injection Inject 0.3 mLs (0.3 mg total) into the muscle once. Patient taking differently: Inject 0.3 mg into the muscle as needed (for allergic reaction). 09/30/12   Le, Thao P, DO  ferrous sulfate 325 (65 FE) MG tablet Take 325 mg by mouth 3 (three) times a week. TUE THUR SAT    [provider]  fluticasone  (FLONASE) 50 MCG/ACT nasal spray Place 1 spray into both nostrils daily.    [provider]  folic acid (FOLVITE) [id number] MCG tablet Take 400 mcg by mouth daily.     [provider]  furosemide (LASIX) 40 MG tablet Take 40 mg by mouth 2 (two) times daily.     [provider]  gabapentin (NEURONTIN) 300 MG capsule Take 1 capsule (300 mg total) by mouth at bedtime as needed (Neuropathic pain). Patient taking differently: Take 300 mg by mouth as needed (pain). 12/14/17 10/20/20  Dana Allan I, MD  hydrALAZINE (APRESOLINE) 100 MG tablet Take 1 tablet (100 mg total) by mouth every 8 (eight) hours. 07/06/20   [name], MD  isosorbide mononitrate (IMDUR) 60 MG 24 hr tablet Take 1 tablet (60 mg total) by mouth daily. 03/02/18   Thurnell Lose, MD  JANUVIA 25 MG tablet Take 1 tablet (25 mg total) by mouth daily. 07/06/20   [name], MD  labetalol (NORMODYNE) 200 MG tablet Take 400 mg by mouth 3 (three) times daily.    [provider]  LORazepam (ATIVAN) 1 MG tablet Take 1 mg by mouth every 8 (eight) hours as needed for anxiety. 06/25/20   [provider]  montelukast (SINGULAIR) 10 MG tablet Take 10 mg by mouth daily with supper.     [provider]  multivitamin (RENA-VIT) TABS tablet Take 1 tablet by mouth daily with supper.    [provider]  nitroGLYCERIN (NITROSTAT) 0.4 MG SL tablet Place 0.4 mg under the tongue every 5 (five) minutes as needed for chest pain. 08/01/20   [provider]  olmesartan (BENICAR) 40 MG tablet Take 40 mg by mouth every evening. 05/06/19   [provider]  oxyCODONE (ROXICODONE) 15 MG immediate release tablet Take 15 mg by mouth 3 (three) times daily as needed for pain. 05/31/20   [provider]  pantoprazole (PROTONIX) 40 MG tablet Take 40 mg by mouth daily. 06/09/20   [provider]  predniSONE (DELTASONE) 5 MG tablet Take 1 tablet (5 mg total) by mouth daily with  breakfast. 07/06/20  [name], MD  Probiotic Product (PROBIOTIC PO) Take 1 capsule by mouth daily.    [provider]  Tiotropium Bromide Monohydrate (SPIRIVA RESPIMAT IN) Inhale 2 puffs into the lungs daily.    [provider]  VENTOLIN HFA 108 (90 Base) MCG/ACT inhaler Inhale 1-2 puffs into the lungs every 6 (six) hours as needed for shortness of breath. wheezing 06/13/15   [provider]  XELJANZ 5 MG TABS Take 5 mg by mouth at bedtime. 05/17/19   [provider]  zolpidem (AMBIEN) 5 MG tablet Take 5 mg by mouth at bedtime. 05/05/19   [provider]    Allergies    3-methyl-2-benzothiazolinone hydrazone, Banana, Black walnut flavor, Hazelnut (filbert) allergy skin test, Leflunomide and related, Lisinopril, No healthtouch food allergies, Other, Pecan extract allergy skin test, Pecan nut (diagnostic), Trazodone and nefazodone, Adalimumab, Escitalopram oxalate, Ezetimibe, Secukinumab, Statins, and Ibuprofen  Review of Systems   Review of Systems  Constitutional:  Negative for diaphoresis and fever.  Eyes:  Negative for redness.  Respiratory:  Positive for shortness of breath and wheezing. Negative for cough.   Cardiovascular:  Positive for chest pain. Negative for palpitations and leg swelling.  Gastrointestinal:  Negative for abdominal pain, nausea and vomiting.  Genitourinary:  Negative for dysuria.  Musculoskeletal:  Positive for myalgias. Negative for back pain and neck pain.  Skin:  Negative for rash.  Neurological:  Negative for syncope and light-headedness.  Psychiatric/Behavioral:  The patient is not nervous/anxious.    Physical Exam Updated Vital Signs BP (!) 199/93 (BP Location: Right Arm)   Pulse 93   Temp 98 F (36.7 C) (Oral)   Resp (!) 24   SpO2 97%   Physical Exam Vitals and nursing note reviewed.  Constitutional:      Appearance: She is well-developed. She is not diaphoretic.  HENT:     Head: Normocephalic and  atraumatic.     Nose: Nose normal.     Mouth/Throat:     Mouth: Mucous membranes are moist. Mucous membranes are not dry.  Eyes:     Conjunctiva/sclera: Conjunctivae normal.  Neck:     Vascular: Normal carotid pulses. No JVD.     Trachea: Trachea normal. No tracheal deviation.  Cardiovascular:     Rate and Rhythm: Normal rate and regular rhythm.     Pulses: No decreased pulses.          Radial pulses are 2+ on the right side and 2+ on the left side.     Heart sounds: S1 normal and S2 normal. Murmur heard.  Pulmonary:     Effort: Pulmonary effort is normal. No respiratory distress.     Breath sounds: Wheezing and rales present.     Comments: Minimal scattered wheezing, expiratory Chest:     Chest wall: No tenderness.  Abdominal:     General: Bowel sounds are normal.     Palpations: Abdomen is soft.     Tenderness: There is no abdominal tenderness. There is no guarding or rebound.  Musculoskeletal:        General: Normal range of motion.     Cervical back: Normal range of motion and neck supple. No muscular tenderness.     Right lower leg: No edema.     Left lower leg: No edema.     Comments: No significant lower extremity edema  Skin:    General: Skin is warm and dry.     Coloration: Skin is not pale.  Neurological:  Mental Status: She is alert.    ED Results / Procedures / Treatments   Labs (all labs ordered are listed, but only abnormal results are displayed) Labs Reviewed  CBC WITH DIFFERENTIAL/PLATELET - Abnormal; Notable for the following components:      Result Value   RBC 2.78 (*)    Hemoglobin 9.2 (*)    HCT 28.1 (*)    MCV 101.1 (*)    All other components within normal limits  BASIC METABOLIC PANEL - Abnormal; Notable for the following components:   Sodium 134 (*)    Chloride 97 (*)    Glucose, Bld 103 (*)    Creatinine, Ser 3.86 (*)    GFR, Estimated 13 (*)    All other components within normal limits  TROPONIN I (HIGH SENSITIVITY) - Abnormal;  Notable for the following components:   Troponin I (High Sensitivity) 40 (*)    All other components within normal limits  TROPONIN I (HIGH SENSITIVITY) - Abnormal; Notable for the following components:   Troponin I (High Sensitivity) 126 (*)    All other components within normal limits  RESP PANEL BY RT-PCR (FLU A&B, COVID) ARPGX2  MAGNESIUM  PROTIME-INR  HEPARIN LEVEL (UNFRACTIONATED)    ED ECG REPORT   Date: 09/08/2020  Rate: 80  Rhythm: normal sinus rhythm  QRS Axis: left  Intervals: normal  ST/T Wave abnormalities: normal  Conduction Disutrbances:none  Narrative Interpretation:   Old EKG Reviewed: unchanged  I have personally reviewed the EKG tracing and agree with the computerized printout as noted.   Radiology DG Chest 2 View  Result Date: 09/08/2020 CLINICAL DATA:  Shortness of breath beginning this morning EXAM: CHEST - 2 VIEW COMPARISON:  08/22/2020 FINDINGS: Lateral view degraded by patient arm position. Cervical spine fixation. Midline trachea. Moderate cardiomegaly. Atherosclerosis in the transverse aorta. No pleural effusion or pneumothorax. Moderate interstitial edema is asymmetric, greater on the right. Felt to be slightly increased. IMPRESSION: Cardiomegaly with moderate interstitial edema, slightly increased. Aortic Atherosclerosis (ICD10-I70.0). Electronically Signed   By: Abigail Miyamoto M.D.   On: 09/08/2020 13:25    Procedures Procedures   Medications Ordered in ED Medications  nitroGLYCERIN 50 mg in dextrose 5 % 250 mL (0.2 mg/mL) infusion (5 mcg/min Intravenous New Bag/Given 09/08/20 1613)  heparin ADULT infusion 100 units/mL (25000 units/241m) (has no administration in time range)  albuterol (PROVENTIL) (2.5 MG/3ML) 0.083% nebulizer solution 5 mg (5 mg Nebulization Given 09/08/20 1232)  nitroGLYCERIN (NITROSTAT) SL tablet 0.4 mg (0.4 mg Sublingual Given 09/08/20 1341)  labetalol (NORMODYNE) injection 20 mg (20 mg Intravenous Given 09/08/20 1438)   nitroGLYCERIN (NITROGLYN) 2 % ointment 1 inch (1 inch Topical Given 09/08/20 1513)    ED Course  I have reviewed the triage vital signs and the nursing notes.  Pertinent labs & imaging results that were available during my care of the patient were reviewed by me and considered in my medical decision making (see chart for details).  EKG reviewed.   Vital signs reviewed and are as follows: BP (!) 199/93 (BP Location: Right Arm)   Pulse 93   Temp 98 F (36.7 C) (Oral)   Resp (!) 24   SpO2 97%   Patient seen and examined. Work-up initiated. Medications ordered.  She was placed on supplemental oxygen on arrival due to reported chest pain and shortness of breath.  There have been no episodes of hypoxia reported.  Patient will be weaned down to ensure no hypoxia, changed from 4 L/min to 2  L/min.  1:14 PM Pt currently on 4L/min. CXR with edema. Discussed with Dr. Joya Gaskins who will see patient. Will give SL NTG, consider dose of labetalol. Will likely need admission. No need for bipap unless worsening.   2:58 PM BP is slightly improved with treatment thus far.  Will place Nitropaste.  Last reading 181/100.  Plan for admission to the hospital.  COVID-negative.  4:18 PM Discussed with Dr. Roosevelt Locks with [facility]. He requests ED to ED transfer due to possibility of needing dialysis for pulmonary edema. Discussed with Dr. Tyrone Nine who accepts.   After discussion with hospitalist, troponin 40 >> 126. Ordered heparin. Will need better BP control, will start NTG drip. Goal 0000000 systolic.   Notified Dr. Roosevelt Locks who agrees with plan.   Repeat EKG unchanged.        CRITICAL CARE Performed by: Carlisle Cater PA-C Total critical care time: 45 minutes Critical care time was exclusive of separately billable procedures and treating other patients. Critical care was necessary to treat or prevent imminent or life-threatening deterioration. Critical care was time spent personally by me on the following activities:  development of treatment plan with patient and/or surrogate as well as nursing, discussions with consultants, evaluation of patient's response to treatment, examination of patient, obtaining history from patient or surrogate, ordering and performing treatments and interventions, ordering and review of laboratory studies, ordering and review of radiographic studies, pulse oximetry and re-evaluation of patient's condition.     MDM Rules/Calculators/A&P                           Admit.    Final Clinical Impression(s) / ED Diagnoses Final diagnoses:  Acute pulmonary edema ([place])  Hypertensive urgency  ESRD (end stage renal disease) [facility])    Rx / DC Orders ED Discharge Orders     None        Suann Larry 09/08/20 2046    Arnaldo Natal, MD 09/09/20 [phone]

## 2020-09-08 NOTE — ED Notes (Signed)
PT taken to Dialysis on bipap with RT and RN. Report given to Ob[facility]. MD RS also made aware of critical trop of 330

## 2020-09-08 NOTE — Progress Notes (Signed)
ESRD and Fluid overload and HTN emergency, I asked ED physician at [facility] to do ED to ED transfer, patient need emergency HD.

## 2020-09-08 NOTE — ED Notes (Signed)
Report given to [facility]. 

## 2020-09-08 NOTE — ED Provider Notes (Signed)
  Physical Exam  BP (!) 191/97 (BP Location: Right Arm)   Pulse 90   Temp 98.1 F (36.7 C) (Oral)   Resp 19   Ht '5\' 2"'$  (1.575 m)   Wt 68 kg   SpO2 99%   BMI 27.44 kg/m   Physical Exam Vitals and nursing note reviewed.  Constitutional:      General: She is not in acute distress.    Appearance: She is well-developed.  HENT:     Head: Normocephalic and atraumatic.  Eyes:     Extraocular Movements: Extraocular movements intact.  Cardiovascular:     Rate and Rhythm: Normal rate.  Pulmonary:     Breath sounds: Rhonchi present.     Comments: Rhonchi in bilateral lower bases Abdominal:     General: There is no distension.  Musculoskeletal:        General: Normal range of motion.     Cervical back: Normal range of motion.  Skin:    General: Skin is warm.     Findings: No rash.  Neurological:     Mental Status: She is alert and oriented to person, place, and time.    ED Course/Procedures     Procedures  MDM   Patient sent from [place] ER.  Presenting for chest pain and shortness of breath.  Concern for fluid overload.  Patient has been accepted and admitted by [facility] hospitalist service, however there are no beds available inpatient, so patient was sent ED to ED for more urgent evaluation and possible dialysis.  Patient has uptrending trops, is requiring oxygen, and reporting worsening shortness of breath.  Discussed with Dr. Roosevelt Locks from [facility] hospitalist service, he is aware patient is in the ER.  Discussed with Dr. Jonnie Finner from nephrology who will evaluate patient for dialysis.      Franchot Heidelberg, PA-C 09/08/20 Evalee Jefferson    Fredia Sorrow, MD 09/13/20 1241

## 2020-09-09 ENCOUNTER — Encounter (HOSPITAL_COMMUNITY): Payer: Self-pay | Admitting: Internal Medicine

## 2020-09-09 ENCOUNTER — Inpatient Hospital Stay (HOSPITAL_COMMUNITY): Payer: Medicare PPO

## 2020-09-09 DIAGNOSIS — I214 Non-ST elevation (NSTEMI) myocardial infarction: Secondary | ICD-10-CM | POA: Diagnosis not present

## 2020-09-09 DIAGNOSIS — I5031 Acute diastolic (congestive) heart failure: Secondary | ICD-10-CM

## 2020-09-09 DIAGNOSIS — J810 Acute pulmonary edema: Secondary | ICD-10-CM

## 2020-09-09 DIAGNOSIS — N186 End stage renal disease: Secondary | ICD-10-CM

## 2020-09-09 LAB — GLUCOSE, CAPILLARY
Glucose-Capillary: 82 mg/dL (ref 70–99)
Glucose-Capillary: 235 mg/dL — ABNORMAL HIGH (ref 70–99)
Glucose-Capillary: 184 mg/dL — ABNORMAL HIGH (ref 70–99)

## 2020-09-09 LAB — CBC
HCT: 22.8 % — ABNORMAL LOW (ref 36.0–46.0)
Hemoglobin: 7.5 g/dL — ABNORMAL LOW (ref 12.0–15.0)
MCH: 33 pg (ref 26.0–34.0)
MCHC: 32.9 g/dL (ref 30.0–36.0)
MCV: 100.4 fL — ABNORMAL HIGH (ref 80.0–100.0)
Platelets: 171 10*3/uL (ref 150–400)
RBC: 2.27 MIL/uL — ABNORMAL LOW (ref 3.87–5.11)
RDW: 14.1 % (ref 11.5–15.5)
WBC: 5.2 10*3/uL (ref 4.0–10.5)
nRBC: 0 % (ref 0.0–0.2)

## 2020-09-09 LAB — ECHOCARDIOGRAM COMPLETE
AR max vel: 1.04 cm2
AV Area VTI: 1.06 cm2
AV Area mean vel: 1.02 cm2
AV Mean grad: 24 mmHg
AV Peak grad: 42.5 mmHg
Ao pk vel: 3.26 m/s
Area-P 1/2: 3.91 cm2
Height: 62 in
S' Lateral: 3.2 cm
Weight: 2433.88 oz

## 2020-09-09 LAB — HEMOGLOBIN A1C
Hgb A1c MFr Bld: 5.2 % (ref 4.8–5.6)
Mean Plasma Glucose: 102.54 mg/dL

## 2020-09-09 LAB — BASIC METABOLIC PANEL
Anion gap: 7 (ref 5–15)
BUN: 10 mg/dL (ref 6–20)
CO2: 31 mmol/L (ref 22–32)
Calcium: 9.2 mg/dL (ref 8.9–10.3)
Chloride: 97 mmol/L — ABNORMAL LOW (ref 98–111)
Creatinine, Ser: 3.14 mg/dL — ABNORMAL HIGH (ref 0.44–1.00)
GFR, Estimated: 16 mL/min — ABNORMAL LOW (ref >60)
Glucose, Bld: 82 mg/dL (ref 70–99)
Potassium: 3.8 mmol/L (ref 3.5–5.1)
Sodium: 135 mmol/L (ref 135–145)

## 2020-09-09 LAB — TROPONIN I (HIGH SENSITIVITY): Troponin I (High Sensitivity): 1499 ng/L (ref <18)

## 2020-09-09 LAB — HEMOGLOBIN AND HEMATOCRIT, BLOOD
HCT: 24.3 % — ABNORMAL LOW (ref 36.0–46.0)
Hemoglobin: 8.2 g/dL — ABNORMAL LOW (ref 12.0–15.0)

## 2020-09-09 LAB — HEPARIN LEVEL (UNFRACTIONATED)
Heparin Unfractionated: 0.12 IU/mL — ABNORMAL LOW (ref 0.30–0.70)
Heparin Unfractionated: 0.21 IU/mL — ABNORMAL LOW (ref 0.30–0.70)

## 2020-09-09 LAB — SEDIMENTATION RATE: Sed Rate: 30 mm/hr — ABNORMAL HIGH (ref 0–22)

## 2020-09-09 LAB — PREPARE RBC (CROSSMATCH)

## 2020-09-09 LAB — C-REACTIVE PROTEIN: CRP: 5.7 mg/dL — ABNORMAL HIGH (ref <1.0)

## 2020-09-09 MED ORDER — CLONIDINE HCL 0.3 MG PO TABS
0.3000 mg | ORAL_TABLET | Freq: Three times a day (TID) | ORAL | Status: DC
  Administered 2020-09-09 – 2020-09-12 (×11): 0.3 mg via ORAL
  Filled 2020-09-09 (×11): qty 1

## 2020-09-09 MED ORDER — PREDNISONE 20 MG PO TABS
20.0000 mg | ORAL_TABLET | Freq: Every day | ORAL | Status: DC
  Administered 2020-09-10: 20 mg via ORAL
  Filled 2020-09-09: qty 1

## 2020-09-09 MED ORDER — LABETALOL HCL 200 MG PO TABS
400.0000 mg | ORAL_TABLET | Freq: Three times a day (TID) | ORAL | Status: DC
  Administered 2020-09-09 – 2020-09-12 (×11): 400 mg via ORAL
  Filled 2020-09-09 (×11): qty 2

## 2020-09-09 MED ORDER — ASPIRIN EC 81 MG PO TBEC
81.0000 mg | DELAYED_RELEASE_TABLET | Freq: Every day | ORAL | Status: DC
  Administered 2020-09-09 – 2020-09-12 (×4): 81 mg via ORAL
  Filled 2020-09-09 (×4): qty 1

## 2020-09-09 MED ORDER — CHLORHEXIDINE GLUCONATE 0.12 % MT SOLN
15.0000 mL | Freq: Two times a day (BID) | OROMUCOSAL | Status: DC
  Administered 2020-09-09 – 2020-09-29 (×28): 15 mL via OROMUCOSAL
  Filled 2020-09-09 (×34): qty 15

## 2020-09-09 MED ORDER — ORAL CARE MOUTH RINSE
15.0000 mL | Freq: Two times a day (BID) | OROMUCOSAL | Status: DC
  Administered 2020-09-09 – 2020-10-02 (×19): 15 mL via OROMUCOSAL

## 2020-09-09 MED ORDER — POLYETHYLENE GLYCOL 3350 17 G PO PACK
17.0000 g | PACK | Freq: Every day | ORAL | Status: DC
  Administered 2020-09-11 – 2020-09-24 (×5): 17 g via ORAL
  Filled 2020-09-09 (×7): qty 1

## 2020-09-09 MED ORDER — SODIUM CHLORIDE 0.9% IV SOLUTION
Freq: Once | INTRAVENOUS | Status: DC
Start: 2020-09-09 — End: 2020-09-13

## 2020-09-09 MED ORDER — SORBITOL 70 % SOLN
30.0000 mL | Freq: Once | Status: AC
  Administered 2020-09-09: 30 mL via ORAL
  Filled 2020-09-09: qty 30

## 2020-09-09 MED ORDER — NITROGLYCERIN IN D5W 200-5 MCG/ML-% IV SOLN
5.0000 ug/min | INTRAVENOUS | Status: DC
  Administered 2020-09-09: 5 ug/min via INTRAVENOUS
  Administered 2020-09-10: 45 ug/min via INTRAVENOUS
  Administered 2020-09-10: 55 ug/min via INTRAVENOUS
  Administered 2020-09-11: 65 ug/min via INTRAVENOUS
  Administered 2020-09-11: 75 ug/min via INTRAVENOUS
  Administered 2020-09-12: 85 ug/min via INTRAVENOUS
  Administered 2020-09-12: 75 ug/min via INTRAVENOUS
  Administered 2020-09-13: 85 ug/min via INTRAVENOUS
  Filled 2020-09-09 (×7): qty 250

## 2020-09-09 NOTE — Progress Notes (Signed)
Patient's RN consult for new PIV access. Patient has two PIV access and infusing nitroglycerine and heparin drip each PIV access. Nitro and heparin are comparable to run together. Patient's RN called pharmacist regarding this matter and pharmacist told her it is okay to run. Patient was not need third PIV access at this time. HS [employer]

## 2020-09-09 NOTE — Progress Notes (Signed)
[facility] Progress Note  Subjective: only tolerate 1.5- 2 hrs of HD overnight, net UF 1.5 L . If off bipap but still SOB. Had cramping on HD. Seen in room. Still SOB but improved.   Vitals:   09/09/20 0300 09/09/20 0400 09/09/20 0733 09/09/20 0820  BP: (!) 174/82 (!) 165/78  (!) 186/84  Pulse: 74 72  86  Resp: '17 14  14  '$ Temp:    98.9 F (37.2 C)  TempSrc:    Oral  SpO2: 100% 100% 100% 100%  Weight:  69 kg    Height:        Exam: Gen alert, no distress, [place] O2 No rash, cyanosis or gangrene Sclera anicteric, throat clear No jvd or bruits Chest bilat basilar rales RRR no MRG Abd soft ntnd no mass or ascites +bs GU normal MS no joint effusions or deformity Ext 1+ LE edema, no wounds or ulcers Neuro is alert, Ox 3 , nf  LUE AVF+bruit      Home meds include phoslo 3 ac tid, norvasc, allopurinol, sensipar 30 tiw mwf hd, capares 0.3 tid, lasix 40, neurontin 300 hs prn/ hydralazine 100 tid, imdur 60, januvia, labetalol 400 tid, singulair, renavit, sl ntg prn, olmesartan 300 hs, oxy IR prn, protonix, prednisone 5 qd, spiriva respimat, venotolin hfa, xeljanz 5 hs, prn's     CXR - 7/30 diffuse IS edema  CXR - 7/31 improving but persistent IS edema    OP HD: MWF [facility]/ [facility]  3.5h  2 /2.5 bath  dry wt pend   Hep none   LUA AVF  400/700   na 134  K 4.4 CO2 29   BUN 18  Cr 3.8   Assessment/ Plan Resp distress/ acute hypoxic resp failure - due to vol overload most likely. May be losing body weight. Needs records from her OP unit. Only tolerated 1.5 L off last night, breathing better but still wet.  Will plan HD again today later this evening w/ gentler UF 2-2.5 L over 3 hrs w/ transfusion of 1u prbc.  ESRD - on HD MWF at [facility] [address], [facility] HTN - severe, on 5 BP lowering meds at home Anemia - Hb 9.2 > 7.5 , per cards keep her Hb > 8.0.  to get prbc x 1 tonight w hd COPD Gout PAD     Rob Antrone [place] 09/09/2020, 11:41 AM   Recent Labs  Lab 09/08/20 1221  09/09/20 0300  K 4.4 3.8  BUN 18 10  CREATININE 3.86* 3.14*  CALCIUM 10.3 9.2  HGB 9.2* 7.5*   Inpatient medications:  allopurinol  100 mg Oral QPM   amLODipine  10 mg Oral Daily   aspirin EC  81 mg Oral Daily   calcium acetate  2,001 mg Oral TID WC   chlorhexidine  15 mL Mouth Rinse BID   Chlorhexidine Gluconate Cloth  6 each Topical Q0600   cholecalciferol  1,000 Units Oral QPM   [START ON 09/11/2020] cinacalcet  30 mg Oral Q T,Th,Sa-HD   cloNIDine  0.3 mg Oral TID   [START ON 09/10/2020] ferrous sulfate  325 mg Oral Once per day on Mon Wed Fri   fluticasone  1 spray Each Nare Daily   folic acid  1 mg Oral Daily   furosemide  80 mg Intravenous BID   hydrALAZINE  100 mg Oral Q8H   insulin aspart  0-6 Units Subcutaneous TID WC   ipratropium-albuterol  3 mL Nebulization Q6H   irbesartan  300 mg Oral Daily   labetalol  400 mg Oral TID   linagliptin  5 mg Oral Daily   loratadine  10 mg Oral Daily   mouth rinse  15 mL Mouth Rinse q12n4p   methylPREDNISolone (SOLU-MEDROL) injection  60 mg Intravenous Once   montelukast  10 mg Oral Q supper   nicotine  7 mg Transdermal Daily   pantoprazole  40 mg Oral Daily   polyvinyl alcohol  1 drop Both Eyes TID AC & HS   [START ON 09/10/2020] predniSONE  20 mg Oral Q breakfast   saccharomyces boulardii  250 mg Oral Daily   sodium chloride flush  3 mL Intravenous Q12H   Tofacitinib Citrate  5 mg Oral QHS   umeclidinium bromide  1 puff Inhalation Daily   zolpidem  5 mg Oral QHS    sodium chloride     heparin 1,150 Units/hr (09/09/20 0500)   nitroGLYCERIN     sodium chloride, acetaminophen, albuterol, cyclobenzaprine, gabapentin, LORazepam, ondansetron (ZOFRAN) IV, oxyCODONE, sodium chloride flush

## 2020-09-09 NOTE — Progress Notes (Signed)
  Echocardiogram 2D Echocardiogram has been performed.  [name] 09/09/2020, 4:38 PM

## 2020-09-09 NOTE — Plan of Care (Signed)

## 2020-09-09 NOTE — Progress Notes (Signed)
[facility] for Heparin Indication: chest pain/ACS  Allergies  Allergen Reactions   3-Methyl-2-Benzothiazolinone Hydrazone Other (See Comments)    Muscle weakness muscle cramping in legs Other reaction(s): Myalgias (Muscle Pain)   Banana Anaphylaxis and Swelling    TONGUE AND MOUTH SWELLING   Black Walnut Flavor Anaphylaxis    Walnuts   Hazelnut ([place]) Allergy Skin Test Anaphylaxis    Hazelnuts   Leflunomide And Related Other (See Comments)    Severe Headache Other reaction(s): Headache Severe Headache Severe Headache     Lisinopril Swelling    Angioedema  Swelling of the face Angioedema  Swelling of the face Other reaction(s): Facial Swelling    No [employer] Food Allergies Anaphylaxis    Spicy Mustard 4.7.2021 Pt reports that she eats Regular Yellow Mustard   Other Other (See Comments), Anaphylaxis and Swelling    Cosentyx= angioedema  Other reaction(s): Facial Edema (intolerance) Mouth itches and tongue swelling Hazel nuts and pecans also Walnuts Walnuts Renal failure Other reaction(s): Other Serotonin Syndrome  Serotonin syndrome  Tremors Other reaction(s): Other Tremors Muscle weakness muscle cramping in legs Other reaction(s): Myalgias (Muscle Pain) Mouth itches and tongue swelling Hazel nuts and pecans also Walnuts    Pecan Extract Allergy Skin Test Anaphylaxis    Pecans Pecans Pecans    Pecan Nut (Diagnostic) Anaphylaxis    Pecans   Trazodone And Nefazodone Other (See Comments)    Serotonin Syndrome  Serotonin syndrome  Serotonin Syndrome  Serotonin syndrome  Tremors Other reaction(s): Other Tremors    Adalimumab Other (See Comments)    Blisters on abdomen =humira Blisters on abdomen =humira Blisters on abdomen =humira   Escitalopram Oxalate Other (See Comments)    Hand problems - Serotonin Syndrome  Hand problems - Serotonin Syndrome  Hand problems - Serotonin Syndrome  Hand problems     Ezetimibe Other (See Comments)    myalgias cramps myalgias cramps    Secukinumab Swelling    Cosentyx = angiodema Cosentyx = angiodema   Statins Other (See Comments)    Leg pains and weakness Leg pains and weakness   Ibuprofen Other (See Comments)    Renal Problems    Patient Measurements: Height: '5\' 2"'$  (157.5 cm) Weight: 68 kg (150 lb) IBW/kg (Calculated) : 50.1 Heparin Dosing Weight: 64.2  Vital Signs: Temp: 98.9 F (37.2 C) (07/31 0000) Temp Source: Oral (07/31 0000) BP: 174/82 (07/31 0300) Pulse Rate: 74 (07/31 0300)  Labs: Recent Labs    09/08/20 1221 09/08/20 1430 09/08/20 1800 09/09/20 0300  HGB 9.2*  --   --  7.5*  HCT 28.1*  --   --  22.8*  PLT 243  --   --  171  LABPROT  --   --  14.4  --   INR  --   --  1.1  --   HEPARINUNFRC  --   --   --  0.12*  CREATININE 3.86*  --   --  3.14*  TROPONINIHS 40* 126* 330*  --      Estimated Creatinine Clearance: 17.2 mL/min (A) (by C-G formula based on SCr of 3.14 mg/dL (H)).   Medical History: Past Medical History:  Diagnosis Date   Anemia of chronic disease    Anxiety    Aortic stenosis    Arthritis    oa and psoriatic ra   Asthma    Back pain, chronic    lower back   Candida infection finished tx 2 days ago   Chronic  insomnia    COPD (chronic obstructive pulmonary disease) (HCC)    Coronary artery calcification seen on CT scan    Diabetes mellitus    type 2   Eczema    ESRD on dialysis [facility])    Essential hypertension    GERD (gastroesophageal reflux disease)    Gout    History of hiatal hernia    Hypoalbuminemia    Hyponatremia    Mitral stenosis    Obesity    PAD (peripheral artery disease) ([place])    a. externial iliac calcification seen on CT 04/2020.   Pneumonia few yrs ago x 2   Sleep apnea    Tobacco abuse    Upper GI bleed 05/2019    Infusions:   sodium chloride     heparin 1,000 Units/hr (09/08/20 1627)   Assessment: 54 yof presenting with SOB. Patient is ESRD on HD, last  session 7/29. MD [facility] requesting ED to ED transfer for emergent HD in the setting of fluid overload and HTN emergency.  Patient is not on anticoagulation prior to arrival. HGb 9.2 (near baseline); plt 243.  7/31 AM update:  Heparin level low Hgb down some (watch closely) S/P HD session last night  Goal of Therapy:  Heparin level 0.3-0.7 units/ml Monitor platelets by anticoagulation protocol: Yes   Plan:  Inc heparin to 1150 units/hr 1200 heparin level  Narda Bonds, PharmD, BCPS Clinical Pharmacist Phone: [phone]

## 2020-09-09 NOTE — Consult Note (Signed)
Cardiology Consultation:   Patient ID: Jodi Bell MRN: [id number]; DOB: Jun 21, 1960  Admit date: 09/08/2020 Date of Consult: 09/09/2020  PCP:  [name]., MD   [facility] Providers Cardiologist:  None   - somewhat prior disjointed care, saw Dr. Virgina Jock team in 2019, Dr Debara Pickett in consult 06/2020, was pending to see [facility] cardiology soon Click here to update MD or APP on Care Team, Refresh:1}     Patient Profile:   Jodi Bell is a 60 y.o. 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 Tyrell Antonio.  History of Present Illness:   Our team previously met Ms. Maldonado for the first time in 06/2020. She had previously seen Dr. Virgina Jock several years ago but did not recall seeing them. She has not had any office visits since that time but she tells me she had an appointment pending at [facility]. She was seen at [facility] earlier this year in anticipation of renal transplant. Their note indicated prior had issues with baseline BPs 180s but was also having intermittent issues with hypotension at dialysis. (Pt states hypotension has not been an issue recently.) Pre-transplant testing was arranged to include 2D echo 04/2020 showing normal LV function, mild LVH, trivial AI/MR/PR, mild-moderate AS, mild TR, trivial pericardial effusion. Abdominal CT 04/2020 showed no adrenal gland tumors but did demonstrate mild-moderate  atherosclerotic calcification and tortuosity of the external iliac arteries, severe coronary artery calcifications. Cardiac PET MPI same day demonstrated normal LV function, no evidence of ischemia or infarct, abnormal myocardial blood flow reserve suggestive of balanced ischemia and/or microvascular disease. She did not recall any further cardiac testing being planned. Additional CT imaging 04/24/20 showed numerous bilateral renal lesions likely representing cysts but too small to characterize with absolute certainty. We were consulted in 06/2020 for hypertension but this was in the context of missed HD while she had had a recent URI (pt reported she'd tested positive for flu but not corroborated in chart), also some delay in getting medications during that admission so it was unclear what her BP would do after catching up. It was recommended to continue antihypertensive regimen and f/u as OP. TSH was normal. 2D echo 06/2020 showed EF 60-65%, grade 2 DD, mildly elevated PASP, severely dilated LA, mild-moderate mitral stenosis/regurgitation, mild aortic stenosis. She was seen in the ED 08/2020 with low grade temp, shortness of breath, and intermittent chest pain with markedly elevated BP at that time. Troponins were low level 18-19, she was treated with antihypertensive therapy and able to be discharged.  She presented back to the hospital yesterday with progressive shortness of breath. She had been noticing intermittent brief vague left sided chest pains lasting 2-3 minutes over the course of the week for which she'd been periodically taking SL NTG with relief. No aggravating factors otherwise. She had had full dialysis on the day prior, 09/07/20. She also reported diffuse muscle aching feeling like her muscles were being ripped off the bone in her upper body. This felt somewhat similar  to a prior calcium imbalance issue she'd had. Due to the worsening SOB she came to the ER. She was placed on supplemental O2 for  comfort. SBPs 180-190s. CXR showed with cardiomegaly, moderate interstitial edema, aortic atherosclerosis. Given concern for fluid overload she was started on IV Lasix 80mg  BID and sent for emergency HD yesterday evening. Dr. Jonnie Finner questioned whether perhaps she could have lost body weight recently, making her target dry weight actually lower than previous values. Labs reveal hsTroponin 40->126->330->1499, Hgb drop to 7.5 (macrocytic) down from 9-11 range earlier this year, Covid negative. She was started on IV heparin.  BP remains 186/84 by last check. Current antihypertensive rx includes amlodipine 10mg  daily, clonidine 0.3mg  BID, Lasix 80mg  IV BID, hydralazine 100mg  q8hr, irbesartan 300mg  daily, labetalol 400mg  BID (received 20mg  IV in ED), NTG patch (initially IV NTG then converted to patch). Has not had ASA this admission. Denies any evidence of bleeding. She is feeling better this AM but still feels mildly SOB. Chest pains are fleeting but less frequent today.   Past Medical History:  Diagnosis Date   Anemia of chronic disease    Anxiety    Arthritis    oa and psoriatic ra   Asthma    Back pain, chronic    lower back   Candida infection finished tx 2 days ago   Chronic insomnia    COPD (chronic obstructive pulmonary disease) (HCC)    Coronary artery calcification seen on CT scan    Diabetes mellitus    type 2   Eczema    ESRD on dialysis [facility])    Essential hypertension    GERD (gastroesophageal reflux disease)    Gout    History of hiatal hernia    Hypoalbuminemia    Hyponatremia    Mild aortic stenosis    Mitral regurgitation    Mitral stenosis    Obesity    PAD (peripheral artery disease) ([place])    a. externial iliac calcification seen on CT 04/2020.   Pneumonia few yrs ago x 2   Sleep apnea    Tobacco abuse    Upper GI bleed 05/2019    Past Surgical History:  Procedure Laterality Date   BACK SURGERY  2016   lower back fusion with cage   BIOPSY  09/23/2017    Procedure: BIOPSY;  Surgeon: Wonda Horner, MD;  Location: [facility];  Service: Endoscopy;;   BIOPSY  05/19/2019   Procedure: BIOPSY;  Surgeon: Otis Brace, MD;  Location: [place];  Service: Gastroenterology;;   CESAREAN SECTION  1990   x 1    COLONOSCOPY WITH PROPOFOL N/A 09/23/2017   Procedure: COLONOSCOPY WITH PROPOFOL Hemostatic clips placed;  Surgeon: Wonda Horner, MD;  Location: [facility];  Service: Endoscopy;  Laterality: N/A;   DILATION AND CURETTAGE OF UTERUS  1988   ESOPHAGOGASTRODUODENOSCOPY (EGD) WITH PROPOFOL N/A 09/23/2017   Procedure: ESOPHAGOGASTRODUODENOSCOPY (EGD) WITH PROPOFOL;  Surgeon: Wonda Horner, MD;  Location: [facility];  Service: Endoscopy;  Laterality: N/A;   ESOPHAGOGASTRODUODENOSCOPY (EGD) WITH PROPOFOL N/A 05/19/2019   Procedure: ESOPHAGOGASTRODUODENOSCOPY (EGD) WITH PROPOFOL;  Surgeon: Otis Brace, MD;  Location: [place];  Service: Gastroenterology;  Laterality: N/A;   GIVENS CAPSULE STUDY N/A 05/19/2019   Procedure: GIVENS CAPSULE STUDY;  Surgeon: Otis Brace, MD;  Location: [place];  Service: Gastroenterology;  Laterality: N/A;   HOT HEMOSTASIS N/A 05/19/2019   Procedure: HOT HEMOSTASIS (ARGON PLASMA COAGULATION/BICAP);  Surgeon: Otis Brace, MD;  Location: [place];  Service:  Gastroenterology;  Laterality: N/A;   PARTIAL KNEE ARTHROPLASTY Left 11/12/2017   Procedure: left unicompartmental arthroplasty-medial;  Surgeon: Paralee Cancel, MD;  Location: [facility];  Service: Orthopedics;  Laterality: Left;  46min   SUBMUCOSAL INJECTION  09/23/2017   Procedure: SUBMUCOSAL INJECTION;  Surgeon: Wonda Horner, MD;  Location: [facility];  Service: Endoscopy;;  in colon   [place] Medications:  Prior to Admission medications   Medication Sig Start Date End Date Taking? Authorizing Provider  acetaminophen (TYLENOL) 500 MG tablet Take 1,000 mg by mouth every 6 (six) hours as needed for mild pain.     [provider]  albuterol (PROVENTIL) (2.5 MG/3ML) 0.083% nebulizer solution Take 2.5 mg by nebulization as needed for wheezing or shortness of breath. 04/15/19   [provider]  allopurinol (ZYLOPRIM) 100 MG tablet Take 100 mg by mouth every evening.    [provider]  amLODipine (NORVASC) 10 MG tablet Take 10 mg by mouth daily.    [provider]  calcium acetate (PHOSLO) 667 MG capsule Take 3 capsules by mouth in the morning and at bedtime. Take 2 capsules with snacks. 10/25/18   [provider]  carboxymethylcellulose 1 % ophthalmic solution Place 1 drop into both eyes 4 (four) times daily.    [provider]  cetirizine (ZYRTEC) 10 MG tablet Take 10 mg by mouth 2 (two) times daily as needed (hives).  06/13/17   Shelda Pal, DO  Cholecalciferol (VITAMIN D3) 10 MCG (400 UNIT) tablet Take 400 Units by mouth every evening.    [provider]  cinacalcet (SENSIPAR) 30 MG tablet Take 30 mg by mouth 3 (three) times a week. Tuesday, Thursday, Saturday.    [provider]  cloNIDine (CATAPRES) 0.3 MG tablet Take 1 tablet (0.3 mg total) by mouth 3 (three) times daily. 05/21/19   Hosie Poisson, MD  cyclobenzaprine (FLEXERIL) 10 MG tablet Take 10 mg by mouth 3 (three) times daily as needed for muscle spasms. 06/08/20   [provider]  ELDERBERRY PO Take 2 drops by mouth daily.    [provider]  EPINEPHrine (EPIPEN) 0.3 mg/0.3 mL SOAJ injection Inject 0.3 mLs (0.3 mg total) into the muscle once. Patient taking differently: Inject 0.3 mg into the muscle as needed (for allergic reaction). 09/30/12   Le, Thao P, DO  ferrous sulfate 325 (65 FE) MG tablet Take 325 mg by mouth 3 (three) times a week. TUE THUR SAT    [provider]  fluticasone (FLONASE) 50 MCG/ACT nasal spray Place 1 spray into both nostrils daily.    [provider]  folic acid (FOLVITE) 621 MCG tablet Take 400 mcg by mouth  daily.     [provider]  furosemide (LASIX) 40 MG tablet Take 40 mg by mouth 2 (two) times daily.     [provider]  gabapentin (NEURONTIN) 300 MG capsule Take 1 capsule (300 mg total) by mouth at bedtime as needed (Neuropathic pain). Patient taking differently: Take 300 mg by mouth as needed (pain). 12/14/17 10/20/20  Dana Allan I, MD  hydrALAZINE (APRESOLINE) 100 MG tablet Take 1 tablet (100 mg total) by mouth every 8 (eight) hours. 07/06/20   [name], MD  isosorbide mononitrate (IMDUR) 60 MG 24 hr tablet Take 1 tablet (60 mg total) by mouth daily. 03/02/18   Thurnell Lose, MD  JANUVIA 25 MG tablet Take 1 tablet (25 mg total) by mouth daily. 07/06/20  [name], MD  labetalol (NORMODYNE) 200 MG tablet Take 400 mg by mouth 3 (three) times daily.    [provider]  LORazepam (ATIVAN) 1 MG tablet Take 1 mg by mouth every 8 (eight) hours as needed for anxiety. 06/25/20   [provider]  montelukast (SINGULAIR) 10 MG tablet Take 10 mg by mouth daily with supper.     [provider]  multivitamin (RENA-VIT) TABS tablet Take 1 tablet by mouth daily with supper.    [provider]  nitroGLYCERIN (NITROSTAT) 0.4 MG SL tablet Place 0.4 mg under the tongue every 5 (five) minutes as needed for chest pain. 08/01/20   [provider]  olmesartan (BENICAR) 40 MG tablet Take 40 mg by mouth every evening. 05/06/19   [provider]  oxyCODONE (ROXICODONE) 15 MG immediate release tablet Take 15 mg by mouth 3 (three) times daily as needed for pain. 05/31/20   [provider]  pantoprazole (PROTONIX) 40 MG tablet Take 40 mg by mouth daily. 06/09/20   [provider]  predniSONE (DELTASONE) 5 MG tablet Take 1 tablet (5 mg total) by mouth daily with breakfast. 07/06/20   [name], MD  Probiotic Product (PROBIOTIC PO) Take 1 capsule by mouth daily.    [provider]  Tiotropium Bromide  Monohydrate (SPIRIVA RESPIMAT IN) Inhale 2 puffs into the lungs daily.    [provider]  VENTOLIN HFA 108 (90 Base) MCG/ACT inhaler Inhale 1-2 puffs into the lungs every 6 (six) hours as needed for shortness of breath. wheezing 06/13/15   [provider]  XELJANZ 5 MG TABS Take 5 mg by mouth at bedtime. 05/17/19   [provider]  zolpidem (AMBIEN) 5 MG tablet Take 5 mg by mouth at bedtime. 05/05/19   [provider]    Inpatient Medications: Scheduled Meds:  allopurinol  100 mg Oral QPM   amLODipine  10 mg Oral Daily   aspirin EC  81 mg Oral Daily   calcium acetate  2,001 mg Oral TID WC   chlorhexidine  15 mL Mouth Rinse BID   Chlorhexidine Gluconate Cloth  6 each Topical Q0600   cholecalciferol  1,000 Units Oral QPM   [START ON 09/11/2020] cinacalcet  30 mg Oral Q T,Th,Sa-HD   cloNIDine  0.3 mg Oral BID   [START ON 09/10/2020] ferrous sulfate  325 mg Oral Once per day on Mon Wed Fri   fluticasone  1 spray Each Nare Daily   folic acid  1 mg Oral Daily   furosemide  80 mg Intravenous BID   hydrALAZINE  100 mg Oral Q8H   insulin aspart  0-6 Units Subcutaneous TID WC   ipratropium-albuterol  3 mL Nebulization Q6H   irbesartan  300 mg Oral Daily   labetalol  400 mg Oral BID   linagliptin  5 mg Oral Daily   loratadine  10 mg Oral Daily   mouth rinse  15 mL Mouth Rinse q12n4p   methylPREDNISolone (SOLU-MEDROL) injection  60 mg Intravenous Once   montelukast  10 mg Oral Q supper   nicotine  7 mg Transdermal Daily   nitroGLYCERIN  0.2 mg Transdermal Daily   pantoprazole  40 mg Oral Daily   polyvinyl alcohol  1 drop Both Eyes TID AC & HS   predniSONE  40 mg Oral Q breakfast   saccharomyces boulardii  250 mg Oral Daily   sodium chloride flush  3 mL Intravenous Q12H   Tofacitinib Citrate  5  mg Oral QHS   umeclidinium bromide  1 puff Inhalation Daily   zolpidem  5 mg Oral QHS   Continuous Infusions:  sodium chloride     heparin 1,150 Units/hr (09/09/20  0500)   PRN Meds: sodium chloride, acetaminophen, albuterol, cyclobenzaprine, gabapentin, LORazepam, ondansetron (ZOFRAN) IV, oxyCODONE, sodium chloride flush  Allergies:    Allergies  Allergen Reactions   3-Methyl-2-Benzothiazolinone Hydrazone Other (See Comments)    Muscle weakness muscle cramping in legs Other reaction(s): Myalgias (Muscle Pain)   Banana Anaphylaxis and Swelling    TONGUE AND MOUTH SWELLING   Black Walnut Flavor Anaphylaxis    Walnuts   Hazelnut ([place]) Allergy Skin Test Anaphylaxis    Hazelnuts   Leflunomide And Related Other (See Comments)    Severe Headache Other reaction(s): Headache Severe Headache Severe Headache     Lisinopril Swelling    Angioedema  Swelling of the face Angioedema  Swelling of the face Other reaction(s): Facial Swelling    No Healthtouch Food Allergies Anaphylaxis    Spicy Mustard 4.7.2021 Pt reports that she eats Regular Yellow Mustard   Other Other (See Comments), Anaphylaxis and Swelling    Cosentyx= angioedema  Other reaction(s): Facial Edema (intolerance) Mouth itches and tongue swelling Hazel nuts and pecans also Walnuts Walnuts Renal failure Other reaction(s): Other Serotonin Syndrome  Serotonin syndrome  Tremors Other reaction(s): Other Tremors Muscle weakness muscle cramping in legs Other reaction(s): Myalgias (Muscle Pain) Mouth itches and tongue swelling Hazel nuts and pecans also Walnuts    Pecan Extract Allergy Skin Test Anaphylaxis    Pecans Pecans Pecans    Pecan Nut (Diagnostic) Anaphylaxis    Pecans   Trazodone And Nefazodone Other (See Comments)    Serotonin Syndrome  Serotonin syndrome  Serotonin Syndrome  Serotonin syndrome  Tremors Other reaction(s): Other Tremors    Adalimumab Other (See Comments)    Blisters on abdomen =humira Blisters on abdomen =humira Blisters on abdomen =humira   Escitalopram Oxalate Other (See Comments)    Hand problems - Serotonin Syndrome  Hand  problems - Serotonin Syndrome  Hand problems - Serotonin Syndrome  Hand problems    Ezetimibe Other (See Comments)    myalgias cramps myalgias cramps    Secukinumab Swelling    Cosentyx = angiodema Cosentyx = angiodema   Statins Other (See Comments)    Leg pains and weakness Leg pains and weakness   Ibuprofen Other (See Comments)    Renal Problems    Social History:   Social History   Socioeconomic History   Marital status: Divorced    Spouse name: BASIL   Number of children: 3   Years of education: 1   Highest education level: GED or equivalent  Occupational History   Occupation: retired Corporate treasurer  Tobacco Use   Smoking status: Every Day    Packs/day: 0.50    Years: 40.00    Pack years: 20.00    Types: Cigarettes   Smokeless tobacco: Never  Vaping Use   Vaping Use: Never used  Substance and Sexual Activity   Alcohol use: Yes    Comment: occ   Drug use: No   Sexual activity: Not on file  Other Topics Concern   Not on file  Social History Narrative   Not on file   Social Determinants of Health   Financial Resource Strain: Not on file  Food Insecurity: Not on file  Transportation Needs: Not on file  Physical Activity: Not on file  Stress: Not on file  Social  Connections: Not on file  Intimate Partner Violence: Not on file    Family History:   Family History  Problem Relation Age of Onset   Lung cancer Mother    Diabetes Sister    Heart disease Sister    Asthma Daughter    Arthritis Daughter    Arthritis Daughter    Thyroid cancer Other        1/2 sister   Heart disease Other        1/2 sister     ROS:  Please see the history of present illness.  All other ROS reviewed and negative.     Physical Exam/Data:   Vitals:   09/09/20 0300 09/09/20 0400 09/09/20 0733 09/09/20 0820  BP: (!) 174/82 (!) 165/78  (!) 186/84  Pulse: 74 72  86  Resp: $Remo'17 14  14  'AMZJw$ Temp:    98.9 F (37.2 C)  TempSrc:    Oral  SpO2: 100% 100% 100% 100%  Weight:  69 kg     Height:        Intake/Output Summary (Last 24 hours) at 09/09/2020 0959 Last data filed at 09/09/2020 0600 Gross per 24 hour  Intake 139.53 ml  Output 1503 ml  Net -1363.47 ml   Last 3 Weights 09/09/2020 09/08/2020 08/22/2020  Weight (lbs) 152 lb 1.9 oz 150 lb 168 lb  Weight (kg) 69 kg 68.04 kg 76.204 kg     Body mass index is 27.82 kg/m.  General: Well developed, well nourished AAF in no acute distress. Head: Normocephalic, atraumatic, mild scleral dullness, no xanthomas, nares are without discharge, + exophthalmos/periorbital prominence Neck: Negative for carotid bruits. JVP not elevated. Lungs: Clear bilaterally to auscultation without wheezes, rales, or rhonchi. Breathing is unlabored. Heart: RRR 2/6 SEM heard over precordium, preserved S2, no rubs, or gallops.  Abdomen: Soft, non-tender, non-distended with normoactive bowel sounds. No rebound/guarding. Extremities: No clubbing or cyanosis. Trace edema. Distal pedal pulses are 2+ and equal bilaterally. Neuro: Alert and oriented X 3. Moves all extremities spontaneously. Psych:  Responds to questions appropriately with a normal affect.   EKG:  The EKG was personally reviewed and demonstrates:   Multiple tracings reviewed Most recently NSR 86bpm probable LAE, nonspecific STTW changes, left axis deviation Telemetry:  Telemetry was personally reviewed and demonstrates:  NSR  Relevant CV Studies: 2D echo 06/2020 IMPRESSIONS     1. Left ventricular ejection fraction, by estimation, is 60 to 65%. The  left ventricle has normal function. The left ventricle has no regional  wall motion abnormalities. There is mild concentric left ventricular  hypertrophy. Left ventricular diastolic  parameters are consistent with Grade II diastolic dysfunction  (pseudonormalization). Elevated left atrial pressure.   2. Right ventricular systolic function is normal. The right ventricular  size is normal. There is mildly elevated pulmonary artery  systolic  pressure. The estimated right ventricular systolic pressure is 76.1 mmHg.   3. Left atrial size was severely dilated.   4. The mitral valve is degenerative. Mild to moderate mitral valve  regurgitation. Mild to moderate mitral stenosis. The mean mitral valve  gradient is 8.0 mmHg. Moderate to severe mitral annular calcification.   5. The aortic valve is tricuspid. There is mild calcification of the  aortic valve. There is moderate thickening of the aortic valve. Aortic  valve regurgitation is not visualized. Mild aortic valve stenosis. Aortic  valve mean gradient measures 18.0 mmHg.   Aortic valve Vmax measures 2.88 m/s.   6. The inferior vena cava  is dilated in size with >50% respiratory  variability, suggesting right atrial pressure of 8 mmHg.   Comparison(s): Prior images unable to be directly viewed, comparison made  by report only.   [facility]D [facility] 04[facility]2020 [facility] INTERPRETATION ---------------------------------------------------------------  NORMAL LEFT VENTRICULAR SYSTOLIC FUNCTION WITH MILD LVH  NORMAL RIGHT VENTRICULAR SYSTOLIC FUNCTION  VALVULAR REGURGITATION: TRIVIAL AR, TRIVIAL MR, TRIVIAL PR, MILD TR  VALVULAR STENOSIS: MILD AS  TRIVIAL PERICARDIAL EFFUSION  MILD TO MODERATE AORTIC STENOSIS.  NO PRIOR STUDY FOR COMPARISON   2d Echo Here Today  1. Left ventricular ejection fraction, by estimation, is 60 to 65%. The  left ventricle has normal function. The left ventricle has no regional  wall motion abnormalities. There is mild concentric left ventricular  hypertrophy. Left ventricular diastolic  parameters are consistent with Grade II diastolic dysfunction  (pseudonormalization). Elevated left atrial pressure.   2. Right ventricular systolic function is normal. The right ventricular  size is normal. There is mildly elevated pulmonary artery systolic  pressure. The estimated right ventricular systolic pressure is 63.3 mmHg.   3. Left atrial size was  severely dilated.   4. The mitral valve is degenerative. Mild to moderate mitral valve  regurgitation. Mild to moderate mitral stenosis. The mean mitral valve  gradient is 8.0 mmHg. Moderate to severe mitral annular calcification.   5. The aortic valve is tricuspid. There is mild calcification of the  aortic valve. There is moderate thickening of the aortic valve. Aortic  valve regurgitation is not visualized. Mild aortic valve stenosis. Aortic  valve mean gradient measures 18.0 mmHg.   Aortic valve Vmax measures 2.88 m/s.   6. The inferior vena cava is dilated in size with >50% respiratory  variability, suggesting right atrial pressure of 8 mmHg.   Comparison(s): Prior images unable to be directly viewed, comparison made  by report only.   PET MPI 04/2020   FINAL COMMENTS    Cardiac PET/CT Myocardial Perfusion Study:   1- Cardiac PET/CT Myocardial Perfusion Imaging Study Demonstrate No Evidence of Ischemia or Infarct By Visual Analysis.    Cardiac PET/CT Myocardial Functional Study:   1- Gated Cardiac PET/CT Functional Study Demonstrate Normal Left Ventricular Ejection Fraction at Rest and Stress as Well as   Normal Left Ventricular Volumes.   2- Gated Cardiac PET/CT Functional Study Demonstrate Normal Left Ventricular Systolic Function and Normal Wall Thickening.   3- No Evidence of Increased Tracer Uptake in The Right Ventricular Free Wall.     Cardiac PET/CT Global Myocardial Blood Flow Quantitation:    Rest Myocardial Blood Flow - 1.0 ml/min/g   Stress Myocardial Blood Flow: 1.2 ml/min/g   Myocardial Blood Flow Reserve: 1.2 ml/min/g ( Normal >45ml/min/g )    Cardiac CT Viewer: Evidence Of Significant Coronary Artery Calcifications.    Conclusion: Significant Coronary Artery Calcifications. No Evidence of Ischemia or Infarct By Visual Analysis. Abnormal   Myocardial Blood Flow Reserve Suggestive of Balanced Ischemia and/or Microvascular Disease. Normal Left Ventricle Ejection    Fraction. Normal Left Ventricular Systolic Function and Volumes.      Laboratory Data:  High Sensitivity Troponin:   Recent Labs  Lab 08/23/20 0235 09/08/20 1221 09/08/20 1430 09/08/20 1800 09/09/20 0605  TROPONINIHS 18* 40* 126* 330* 1,499*     Chemistry Recent Labs  Lab 09/08/20 1221 09/09/20 0300  NA 134* 135  K 4.4 3.8  CL 97* 97*  CO2 29 31  GLUCOSE 103* 82  BUN 18 10  CREATININE 3.86* 3.14*  CALCIUM 10.3  9.2  GFRNONAA 13* 16*  ANIONGAP 8 7    No results for input(s): PROT, ALBUMIN, AST, ALT, ALKPHOS, BILITOT in the last 168 hours. Hematology Recent Labs  Lab 09/08/20 1221 09/09/20 0300  WBC 7.1 5.2  RBC 2.78* 2.27*  HGB 9.2* 7.5*  HCT 28.1* 22.8*  MCV 101.1* 100.4*  MCH 33.1 33.0  MCHC 32.7 32.9  RDW 14.2 14.1  PLT 243 171   BNPNo results for input(s): BNP, PROBNP in the last 168 hours.  DDimer No results for input(s): DDIMER in the last 168 hours.   Radiology/Studies:  DG Chest 1 View  Result Date: 09/09/2020 CLINICAL DATA:  CHF.  Shortness of breath. EXAM: CHEST  1 VIEW COMPARISON:  September 08, 2020 FINDINGS: No pneumothorax. Stable cardiomegaly. The hila and mediastinum are unchanged. Diffuse interstitial opacities in the lungs are improved in the interval but do persist. Probable small left effusion with underlying atelectasis. No other interval changes or acute abnormalities. IMPRESSION: Stable cardiomegaly. Persistent but improving pulmonary edema. Persistent small left effusion with underlying atelectasis. Electronically Signed   By: Dorise Bullion III M.D   On: 09/09/2020 09:18   DG Chest 2 View  Result Date: 09/08/2020 CLINICAL DATA:  Shortness of breath beginning this morning EXAM: CHEST - 2 VIEW COMPARISON:  08/22/2020 FINDINGS: Lateral view degraded by patient arm position. Cervical spine fixation. Midline trachea. Moderate cardiomegaly. Atherosclerosis in the transverse aorta. No pleural effusion or pneumothorax. Moderate interstitial  edema is asymmetric, greater on the right. Felt to be slightly increased. IMPRESSION: Cardiomegaly with moderate interstitial edema, slightly increased. Aortic Atherosclerosis (ICD10-I70.0). Electronically Signed   By: Abigail Miyamoto M.D.   On: 09/08/2020 13:25     Assessment and Plan:   1. Acute on chronic diastolic CHF in the context of ESRD on HD  - volume is managed by dialysis - Dr. Jonnie Finner questioned whether perhaps she could have lost body weight recently, making her target dry weight actually lower than previous values - will defer the decision to continue IV Lasix to nephrology/primary team - follow-up echocardiogram - uncontrolled HTN likely contributing  2. Resistant hypertension - TSH normal at OSH 08/29/20 - I did curbside Dr. Oval Linsey briefly with question about whether renal duplex is useful - while prognostically can provide info about the why, renal angioplasty does not provide the type of benefit needed to help control BP - current antihypertensive rx includes amlodipine 10mg  daily, clonidine 0.3mg  BID, Lasix 80mg  IV BID, hydralazine 100mg  q8hr, irbesartan 300mg  daily, labetalol 400mg  BID, NTG patch - per d/w Dr. Quentin Ore, switch NTG patch back to IV NTG to be able to titrate as needed - he recommends to follow BP on home regimen today and adjust as needed going forward - appreciate renal input as well  3. Suspected NSTEMI - hsTroponins are higher than expected for demand ischemia - Hgb is noted to be downtrending but no reported signs/sx of bleeding - per d/w Dr. Quentin Ore, anticipate cath tomorrow but we will need to make sure she tolerates aspirin 81mg  and is able to maintain her hemoglobin further (hold off 324mg  dosing per MD) - we will make NPO after midnight - will send message to rounding team to finalize plans for cath - Dr. Quentin Ore would also suggest deferring dialysis until after cath but await final cath plans - continue BB  - change NTG patch to IV NTG to be able to  titrate for BP for anticipated cath - check lipids in AM and consider addition of  statin (limited mortality use in ESRD), also diffuse myalgias recently which may muddle the picture - check echo  4. Acute on chronic anemia - Hgb downtrend to 7.5, patient denies symptoms of bleeding. - Dr. Quentin Ore suggests consideration of transfusion to keep Hgb above 8, will send msg to IM team to make them aware and advise on further work-up - will tentatively plan for an afternoon Hgb as well  5. Valvular heart disease with mild-moderate mitral stenosis/regurgitation and mild aortic stenosis - follow-up echocardiogram  6. Generalized muscle aches - pt reports this is similar to when she previously had issue with calcium level - defer further eval to IM  Risk Assessment/Risk Scores:     TIMI Risk Score for Unstable Angina or Non-ST Elevation MI:   The patient's TIMI risk score is 2, which indicates a 8% risk of all cause mortality, new or recurrent myocardial infarction or need for urgent revascularization in the next 14 days.  New York Heart Association (NYHA) Functional Class NYHA Class III   For questions or updates, please contact [facility] Please consult [email or web address] for contact info under    Signed, Charlie Pitter, PA-C  09/09/2020 9:59 AM

## 2020-09-09 NOTE — Progress Notes (Signed)
[place] for Heparin Indication: chest pain/ACS  Allergies  Allergen Reactions   3-Methyl-2-Benzothiazolinone Hydrazone Other (See Comments)    Muscle weakness muscle cramping in legs Other reaction(s): Myalgias (Muscle Pain)   Banana Anaphylaxis and Swelling    TONGUE AND MOUTH SWELLING   Black Walnut Flavor Anaphylaxis    Walnuts   Hazelnut ([place]) Allergy Skin Test Anaphylaxis    Hazelnuts   Leflunomide And Related Other (See Comments)    Severe Headache Other reaction(s): Headache Severe Headache Severe Headache     Lisinopril Swelling    Angioedema  Swelling of the face Angioedema  Swelling of the face Other reaction(s): Facial Swelling    No [employer] Food Allergies Anaphylaxis    Spicy Mustard 4.7.2021 Pt reports that she eats Regular Yellow Mustard   Other Other (See Comments), Anaphylaxis and Swelling    Cosentyx= angioedema  Other reaction(s): Facial Edema (intolerance) Mouth itches and tongue swelling Hazel nuts and pecans also Walnuts Walnuts Renal failure Other reaction(s): Other Serotonin Syndrome  Serotonin syndrome  Tremors Other reaction(s): Other Tremors Muscle weakness muscle cramping in legs Other reaction(s): Myalgias (Muscle Pain) Mouth itches and tongue swelling Hazel nuts and pecans also Walnuts    Pecan Extract Allergy Skin Test Anaphylaxis    Pecans Pecans Pecans    Pecan Nut (Diagnostic) Anaphylaxis    Pecans   Trazodone And Nefazodone Other (See Comments)    Serotonin Syndrome  Serotonin syndrome  Serotonin Syndrome  Serotonin syndrome  Tremors Other reaction(s): Other Tremors    Adalimumab Other (See Comments)    Blisters on abdomen =humira Blisters on abdomen =humira Blisters on abdomen =humira   Escitalopram Oxalate Other (See Comments)    Hand problems - Serotonin Syndrome  Hand problems - Serotonin Syndrome  Hand problems - Serotonin Syndrome  Hand problems     Ezetimibe Other (See Comments)    myalgias cramps myalgias cramps    Secukinumab Swelling    Cosentyx = angiodema Cosentyx = angiodema   Statins Other (See Comments)    Leg pains and weakness Leg pains and weakness   Ibuprofen Other (See Comments)    Renal Problems    Patient Measurements: Height: '5\' 2"'$  (157.5 cm) Weight: 69 kg (152 lb 1.9 oz) IBW/kg (Calculated) : 50.1 Heparin Dosing Weight: 64.2 kg  Vital Signs: Temp: 98.9 F (37.2 C) (07/31 0000) Temp Source: Oral (07/31 0000) BP: 165/78 (07/31 0400) Pulse Rate: 72 (07/31 0400)  Labs: Recent Labs    09/08/20 1221 09/08/20 1430 09/08/20 1800 09/09/20 0300 09/09/20 0605  HGB 9.2*  --   --  7.5*  --   HCT 28.1*  --   --  22.8*  --   PLT 243  --   --  171  --   LABPROT  --   --  14.4  --   --   INR  --   --  1.1  --   --   HEPARINUNFRC  --   --   --  0.12*  --   CREATININE 3.86*  --   --  3.14*  --   TROPONINIHS 40* 126* 330*  --  1,499*     Estimated Creatinine Clearance: 17.4 mL/min (A) (by C-G formula based on SCr of 3.14 mg/dL (H)).   Medical History: Past Medical History:  Diagnosis Date   Anemia of chronic disease    Anxiety    Aortic stenosis    Arthritis    oa and psoriatic  ra   Asthma    Back pain, chronic    lower back   Candida infection finished tx 2 days ago   Chronic insomnia    COPD (chronic obstructive pulmonary disease) (HCC)    Coronary artery calcification seen on CT scan    Diabetes mellitus    type 2   Eczema    ESRD on dialysis [facility])    Essential hypertension    GERD (gastroesophageal reflux disease)    Gout    History of hiatal hernia    Hypoalbuminemia    Hyponatremia    Mitral stenosis    Obesity    PAD (peripheral artery disease) ([place])    a. externial iliac calcification seen on CT 04/2020.   Pneumonia few yrs ago x 2   Sleep apnea    Tobacco abuse    Upper GI bleed 05/2019    Infusions:   sodium chloride     heparin 1,150 Units/hr (09/09/20 0500)    Assessment: 60 yo female presenting with SOB. Patient is ESRD on HD MWF, received emergent session 7/30. No PTA anticoagulation. Pharmacy consulted to dose heparin for elevated troponin [id number]).   Heparin infusion at 1150 units/hr with subtherapeutic heparin level of 0.21. Hb low 8.2 (BL ~11), platelets normal. No signs or symptoms of bleeding noted.  Goal of Therapy:  Heparin level 0.3-0.7 units/ml Monitor platelets by anticoagulation protocol: Yes   Plan:  Increase heparin to 1300 units/hr  8 hour heparin level Monitor H/H, signs/symptoms of bleeding  Laurey Arrow, PharmD PGY1 Pharmacy Resident 09/09/2020  2:44 PM  Please check [email or web address] for unit-specific pharmacy phone numbers.

## 2020-09-09 NOTE — Progress Notes (Signed)
PROGRESS NOTE    Jodi Bell  JKK:[id number] DOB: 13-Sep-1960 DOA: 09/08/2020 PCP: [name]., MD   Brief Narrative: 60 year old with past medical history significant for ESRD on hemodialysis, PAD, COPD, RA on chronic steroids, CAD, PAD, diabetes, hypertension on 5 blood pressure medications who presents complaining of worsening shortness of breath the day of admission.  Patient had full hemodialysis the day prior to admission.  She has not missed hemodialysis.  Patient is a current smoker.  Patient was found to be in severe respiratory distress, tachypneic, blood pressure significantly elevated systolic 716.  Chest x-ray shows severe fluid overload and pulmonary edema.  She was started on heparin drip nephrology consulted.  Patient underwent hemodialysis on admission.  After hemodialysis she was transition from BiPAP to nasal cannula oxygen.  Assessment & Plan:   Active Problems:   Pulmonary edema   CHF (congestive heart failure) (HCC)  1-Acute Hypoxic respiratory failure, Acute on Chronic Diastolic Heart Failure, Decompensated: Related to volume overload.  Volume management with HD>  She was only able to tolerate 1.5 hour of HD last night.  Plan for HD today.  Continue with BIPAP as needed. She has required BIPAP PRN today. On 3 L oxygen.    2-Hypertensive emergency: Continue with Lopressor, clonidine increase to home dose.  Continue with Norvasc and Hydralazine,  She will be started on nitroglycerin gtt today.   Anemia; of chronic diseases;  Drop in hb.  Plan for Blood transfusion.  CT abdomen negative for retroperitoneal bleed.  Check occult blood.   COPD: Taper prednisone to home dose.  Continue with inhaler.   NSTEMI; Elevation of troponins;CAD: On heparin drip Plan for transfusion with HD. Nitroglycerin gtt started.  Plan for Cath tomorrow.   ESRD on hemodialysis She underwent emergency dialysis night of admission. Appreciate nephrology  assistance.  Diabetes type 2: Continue with Januvia SSI.   Current smoker: Counseling provided.   Abnormal CT scan finding: Erosion of superior endplate of L3 and inferior endplate of L2 with air in the disc space.  The left L3 pedicle screw extending into the inferior aspect of the disc space.  The adjacent soft tissues are not well assessed due to streak artifact of the hardware.  Discitis/osteomyelitis not excluded at L3-L4. -Patient reports prior history of infection in her back. -Proceed with ESR, CRP and blood cultures -Went to get MRI of the lumbar spine  Gallbladder wall thickening and or pericholecystic fluid: She denies abdominal pain, nausea or vomiting.  Monitor for now.  Constipation: Start MiraLAX, received sorbitol.   Estimated body mass index is 27.82 kg/m as calculated from the following:   Height as of this encounter: $RemoveBeforeD'5\' 2"'OyhDiRFvdVUhql$  (1.575 m).   Weight as of this encounter: 69 kg.   DVT prophylaxis: Heparin gtt Code Status: Full Code Family Communication: Care discussed with patient Disposition Plan:  Status is: Inpatient  Remains inpatient appropriate because:IV treatments appropriate due to intensity of illness or inability to take PO  Dispo: The patient is from: Home              Anticipated d/c is to: Home              Patient currently is not medically stable to d/c.   Difficult to place patient No        Consultants:  Cardiology Nephrology   Procedures:  ECHO  Antimicrobials:    Subjective: She report intermittent chest pain.  Complaint of SOB.  She denies abdominal pain. She  report Chronic back pain.   Objective: Vitals:   09/09/20 0143 09/09/20 0200 09/09/20 0300 09/09/20 0400  BP:  (!) 170/84 (!) 174/82 (!) 165/78  Pulse: 75 75 74 72  Resp: $Remo'20 14 17 14  'HrkbY$ Temp:      TempSrc:      SpO2: 100% 100% 100% 100%  Weight:    69 kg  Height:        Intake/Output Summary (Last 24 hours) at 09/09/2020 0709 Last data filed at 09/09/2020  0600 Gross per 24 hour  Intake 139.53 ml  Output 1503 ml  Net -1363.47 ml   Filed Weights   09/08/20 1145 09/09/20 0400  Weight: 68 kg 69 kg    Examination:  General exam: Appears calm and comfortable  Respiratory system: Tachypnea, BL crackle.  Cardiovascular system: S1 & S2 heard, RRR. No JVD, murmurs, rubs, gallops or clicks. No pedal edema. Gastrointestinal system: Abdomen is nondistended, soft and nontender. No organomegaly or masses felt. Normal bowel sounds heard. Central nervous system: Alert and oriented.  Extremities: Symmetric 5 x 5 power.   Data Reviewed: I have personally reviewed following labs and imaging studies  CBC: Recent Labs  Lab 09/08/20 1221 09/09/20 0300  WBC 7.1 5.2  NEUTROABS 4.7  --   HGB 9.2* 7.5*  HCT 28.1* 22.8*  MCV 101.1* 100.4*  PLT 243 673   Basic Metabolic Panel: Recent Labs  Lab 09/08/20 1221 09/09/20 0300  NA 134* 135  K 4.4 3.8  CL 97* 97*  CO2 29 31  GLUCOSE 103* 82  BUN 18 10  CREATININE 3.86* 3.14*  CALCIUM 10.3 9.2  MG 1.9  --    GFR: Estimated Creatinine Clearance: 17.4 mL/min (A) (by C-G formula based on SCr of 3.14 mg/dL (H)). Liver Function Tests: No results for input(s): AST, ALT, ALKPHOS, BILITOT, PROT, ALBUMIN in the last 168 hours. No results for input(s): LIPASE, AMYLASE in the last 168 hours. No results for input(s): AMMONIA in the last 168 hours. Coagulation Profile: Recent Labs  Lab 09/08/20 1800  INR 1.1   Cardiac Enzymes: No results for input(s): CKTOTAL, CKMB, CKMBINDEX, TROPONINI in the last 168 hours. BNP (last 3 results) No results for input(s): PROBNP in the last 8760 hours. HbA1C: Recent Labs    09/09/20 0300  HGBA1C 5.2   CBG: Recent Labs  Lab 09/09/20 0613  GLUCAP 82   Lipid Profile: No results for input(s): CHOL, HDL, LDLCALC, TRIG, CHOLHDL, LDLDIRECT in the last 72 hours. Thyroid Function Tests: No results for input(s): TSH, T4TOTAL, FREET4, T3FREE, THYROIDAB in the last  72 hours. Anemia Panel: No results for input(s): VITAMINB12, FOLATE, FERRITIN, TIBC, IRON, RETICCTPCT in the last 72 hours. Sepsis Labs: No results for input(s): PROCALCITON, LATICACIDVEN in the last 168 hours.  Recent Results (from the past 240 hour(s))  Resp Panel by RT-PCR (Flu A&B, Covid) Nasopharyngeal Swab     Status: None   Collection Time: 09/08/20  1:21 PM   Specimen: Nasopharyngeal Swab; Nasopharyngeal(NP) swabs in vial transport medium  Result Value Ref Range Status   SARS Coronavirus 2 by RT PCR NEGATIVE NEGATIVE Final    Comment: (NOTE) SARS-CoV-2 target nucleic acids are NOT DETECTED.  The SARS-CoV-2 RNA is generally detectable in upper respiratory specimens during the acute phase of infection. The lowest concentration of SARS-CoV-2 viral copies this assay can detect is 138 copies/mL. A negative result does not preclude SARS-Cov-2 infection and should not be used as the sole basis for treatment or other patient  management decisions. A negative result may occur with  improper specimen collection/handling, submission of specimen other than nasopharyngeal swab, presence of viral mutation(s) within the areas targeted by this assay, and inadequate number of viral copies(<138 copies/mL). A negative result must be combined with clinical observations, patient history, and epidemiological information. The expected result is Negative.  Fact Sheet for Patients:  EntrepreneurPulse.com.au  Fact Sheet for Healthcare Providers:  IncredibleEmployment.be  This test is no t yet approved or cleared by the [place] FDA and  has been authorized for detection and/or diagnosis of SARS-CoV-2 by FDA under an Emergency Use Authorization (EUA). This EUA will remain  in effect (meaning this test can be used) for the duration of the COVID-19 declaration under Section 564(b)(1) of the Act, 21 U.S.C.section 360bbb-3(b)(1), unless the authorization is  terminated  or revoked sooner.       Influenza A by PCR NEGATIVE NEGATIVE Final   Influenza B by PCR NEGATIVE NEGATIVE Final    Comment: (NOTE) The Xpert Xpress SARS-CoV-2/FLU/RSV plus assay is intended as an aid in the diagnosis of influenza from Nasopharyngeal swab specimens and should not be used as a sole basis for treatment. Nasal washings and aspirates are unacceptable for Xpert Xpress SARS-CoV-2/FLU/RSV testing.  Fact Sheet for Patients: EntrepreneurPulse.com.au  Fact Sheet for Healthcare Providers: IncredibleEmployment.be  This test is not yet approved or cleared by the [place] FDA and has been authorized for detection and/or diagnosis of SARS-CoV-2 by FDA under an Emergency Use Authorization (EUA). This EUA will remain in effect (meaning this test can be used) for the duration of the COVID-19 declaration under Section 564(b)(1) of the Act, 21 U.S.C. section 360bbb-3(b)(1), unless the authorization is terminated or revoked.  Performed at [facility], [address], [place], [place] [address]          Radiology Studies: DG Chest 2 View  Result Date: 09/08/2020 CLINICAL DATA:  Shortness of breath beginning this morning EXAM: CHEST - 2 VIEW COMPARISON:  08/22/2020 FINDINGS: Lateral view degraded by patient arm position. Cervical spine fixation. Midline trachea. Moderate cardiomegaly. Atherosclerosis in the transverse aorta. No pleural effusion or pneumothorax. Moderate interstitial edema is asymmetric, greater on the right. Felt to be slightly increased. IMPRESSION: Cardiomegaly with moderate interstitial edema, slightly increased. Aortic Atherosclerosis (ICD10-I70.0). Electronically Signed   By: Abigail Miyamoto M.D.   On: 09/08/2020 13:25        Scheduled Meds:  allopurinol  100 mg Oral QPM   amLODipine  10 mg Oral Daily   calcium acetate  2,001 mg Oral TID WC   chlorhexidine  15 mL Mouth Rinse BID    Chlorhexidine Gluconate Cloth  6 each Topical Q0600   cholecalciferol  1,000 Units Oral QPM   [START ON 09/11/2020] cinacalcet  30 mg Oral Q T,Th,Sa-HD   cloNIDine  0.3 mg Oral BID   [START ON 09/10/2020] ferrous sulfate  325 mg Oral Once per day on Mon Wed Fri   fluticasone  1 spray Each Nare Daily   folic acid  1 mg Oral Daily   furosemide  80 mg Intravenous BID   hydrALAZINE  100 mg Oral Q8H   insulin aspart  0-6 Units Subcutaneous TID WC   ipratropium-albuterol  3 mL Nebulization Q6H   irbesartan  300 mg Oral Daily   labetalol  400 mg Oral BID   linagliptin  5 mg Oral Daily   loratadine  10 mg Oral Daily   mouth rinse  15 mL  Mouth Rinse q12n4p   methylPREDNISolone (SOLU-MEDROL) injection  60 mg Intravenous Once   montelukast  10 mg Oral Q supper   nicotine  7 mg Transdermal Daily   nitroGLYCERIN  0.2 mg Transdermal Daily   pantoprazole  40 mg Oral Daily   polyvinyl alcohol  1 drop Both Eyes TID AC & HS   predniSONE  40 mg Oral Q breakfast   saccharomyces boulardii  250 mg Oral Daily   sodium chloride flush  3 mL Intravenous Q12H   Tofacitinib Citrate  5 mg Oral QHS   umeclidinium bromide  1 puff Inhalation Daily   zolpidem  5 mg Oral QHS   Continuous Infusions:  sodium chloride     heparin 1,150 Units/hr (09/09/20 0500)     LOS: 1 day    Time spent: 35 minutes.     Elmarie Shiley, MD [facility]   If 7PM-7AM, please contact night-coverage [email or web address]  09/09/2020, 7:09 AM

## 2020-09-10 ENCOUNTER — Encounter (HOSPITAL_COMMUNITY)
Admission: EM | Disposition: A | Discharge status: Home Health | Payer: Self-pay | Source: Home / Self Care | Attending: Surgery

## 2020-09-10 ENCOUNTER — Encounter (HOSPITAL_COMMUNITY): Payer: Self-pay | Admitting: Internal Medicine

## 2020-09-10 DIAGNOSIS — J90 Pleural effusion, not elsewhere classified: Secondary | ICD-10-CM

## 2020-09-10 DIAGNOSIS — J810 Acute pulmonary edema: Secondary | ICD-10-CM | POA: Diagnosis not present

## 2020-09-10 DIAGNOSIS — I5033 Acute on chronic diastolic (congestive) heart failure: Secondary | ICD-10-CM | POA: Diagnosis not present

## 2020-09-10 DIAGNOSIS — Z720 Tobacco use: Secondary | ICD-10-CM

## 2020-09-10 DIAGNOSIS — I2510 Atherosclerotic heart disease of native coronary artery without angina pectoris: Secondary | ICD-10-CM

## 2020-09-10 DIAGNOSIS — Z992 Dependence on renal dialysis: Secondary | ICD-10-CM

## 2020-09-10 DIAGNOSIS — I214 Non-ST elevation (NSTEMI) myocardial infarction: Secondary | ICD-10-CM

## 2020-09-10 DIAGNOSIS — J449 Chronic obstructive pulmonary disease, unspecified: Secondary | ICD-10-CM

## 2020-09-10 DIAGNOSIS — N186 End stage renal disease: Secondary | ICD-10-CM

## 2020-09-10 HISTORY — PX: LEFT HEART CATH AND CORONARY ANGIOGRAPHY: CATH118249

## 2020-09-10 LAB — GLUCOSE, CAPILLARY
Glucose-Capillary: 154 mg/dL — ABNORMAL HIGH (ref 70–99)
Glucose-Capillary: 130 mg/dL — ABNORMAL HIGH (ref 70–99)
Glucose-Capillary: 142 mg/dL — ABNORMAL HIGH (ref 70–99)
Glucose-Capillary: 166 mg/dL — ABNORMAL HIGH (ref 70–99)
Glucose-Capillary: 146 mg/dL — ABNORMAL HIGH (ref 70–99)

## 2020-09-10 LAB — LIPID PANEL
Cholesterol: 193 mg/dL (ref 0–200)
HDL: 69 mg/dL (ref >40)
LDL Cholesterol: 112 mg/dL — ABNORMAL HIGH (ref 0–99)
Total CHOL/HDL Ratio: 2.8 RATIO
Triglycerides: 60 mg/dL (ref <150)
VLDL: 12 mg/dL (ref 0–40)

## 2020-09-10 LAB — BASIC METABOLIC PANEL
Anion gap: 10 (ref 5–15)
BUN: 8 mg/dL (ref 6–20)
CO2: 27 mmol/L (ref 22–32)
Calcium: 9.9 mg/dL (ref 8.9–10.3)
Chloride: 96 mmol/L — ABNORMAL LOW (ref 98–111)
Creatinine, Ser: 2.71 mg/dL — ABNORMAL HIGH (ref 0.44–1.00)
GFR, Estimated: 19 mL/min — ABNORMAL LOW (ref >60)
Glucose, Bld: 198 mg/dL — ABNORMAL HIGH (ref 70–99)
Potassium: 3.5 mmol/L (ref 3.5–5.1)
Sodium: 133 mmol/L — ABNORMAL LOW (ref 135–145)

## 2020-09-10 LAB — CBC
HCT: 27 % — ABNORMAL LOW (ref 36.0–46.0)
Hemoglobin: 9.2 g/dL — ABNORMAL LOW (ref 12.0–15.0)
MCH: 32.7 pg (ref 26.0–34.0)
MCHC: 34.1 g/dL (ref 30.0–36.0)
MCV: 96.1 fL (ref 80.0–100.0)
Platelets: 188 10*3/uL (ref 150–400)
RBC: 2.81 MIL/uL — ABNORMAL LOW (ref 3.87–5.11)
RDW: 15.5 % (ref 11.5–15.5)
WBC: 4.1 10*3/uL (ref 4.0–10.5)
nRBC: 0 % (ref 0.0–0.2)

## 2020-09-10 LAB — POCT ACTIVATED CLOTTING TIME: Activated Clotting Time: 161 seconds

## 2020-09-10 LAB — HEPARIN LEVEL (UNFRACTIONATED): Heparin Unfractionated: 0.57 IU/mL (ref 0.30–0.70)

## 2020-09-10 SURGERY — LEFT HEART CATH AND CORONARY ANGIOGRAPHY
Anesthesia: LOCAL

## 2020-09-10 MED ORDER — HEPARIN (PORCINE) IN NACL 1000-0.9 UT/500ML-% IV SOLN
INTRAVENOUS | Status: DC | PRN
  Administered 2020-09-10 (×2): 500 mL

## 2020-09-10 MED ORDER — HYDRALAZINE HCL 20 MG/ML IJ SOLN
INTRAMUSCULAR | Status: DC | PRN
Start: 2020-09-10 — End: 2020-09-10
  Administered 2020-09-10: 10 mg via INTRAVENOUS

## 2020-09-10 MED ORDER — IPRATROPIUM-ALBUTEROL 0.5-2.5 (3) MG/3ML IN SOLN
3.0000 mL | Freq: Two times a day (BID) | RESPIRATORY_TRACT | Status: DC
  Filled 2020-09-10: qty 3

## 2020-09-10 MED ORDER — FENTANYL CITRATE (PF) 100 MCG/2ML IJ SOLN
INTRAMUSCULAR | Status: AC
  Filled 2020-09-10: qty 2

## 2020-09-10 MED ORDER — LIDOCAINE HCL (PF) 1 % IJ SOLN
INTRAMUSCULAR | Status: AC
  Filled 2020-09-10: qty 30

## 2020-09-10 MED ORDER — SODIUM CHLORIDE 0.9 % IV SOLN
INTRAVENOUS | Status: DC
Start: 2020-09-11 — End: 2020-09-10

## 2020-09-10 MED ORDER — FUROSEMIDE 40 MG PO TABS
80.0000 mg | ORAL_TABLET | Freq: Two times a day (BID) | ORAL | Status: DC
  Administered 2020-09-10 – 2020-09-12 (×5): 80 mg via ORAL
  Filled 2020-09-10 (×5): qty 2

## 2020-09-10 MED ORDER — SODIUM CHLORIDE 0.9% FLUSH
3.0000 mL | Freq: Two times a day (BID) | INTRAVENOUS | Status: DC
  Administered 2020-09-10 – 2020-09-12 (×4): 3 mL via INTRAVENOUS

## 2020-09-10 MED ORDER — PREDNISONE 10 MG PO TABS
10.0000 mg | ORAL_TABLET | Freq: Every day | ORAL | Status: DC
  Administered 2020-09-11: 10 mg via ORAL
  Filled 2020-09-10: qty 1

## 2020-09-10 MED ORDER — LABETALOL HCL 5 MG/ML IV SOLN: 10.0000 mg | INTRAVENOUS | Status: AC | PRN

## 2020-09-10 MED ORDER — HYDRALAZINE HCL 20 MG/ML IJ SOLN
INTRAMUSCULAR | Status: AC
  Filled 2020-09-10: qty 1

## 2020-09-10 MED ORDER — HYDRALAZINE HCL 20 MG/ML IJ SOLN: 10.0000 mg | INTRAMUSCULAR | Status: AC | PRN

## 2020-09-10 MED ORDER — MIDAZOLAM HCL 2 MG/2ML IJ SOLN
INTRAMUSCULAR | Status: DC | PRN
  Administered 2020-09-10: 1 mg via INTRAVENOUS

## 2020-09-10 MED ORDER — HEPARIN (PORCINE) IN NACL 1000-0.9 UT/500ML-% IV SOLN
INTRAVENOUS | Status: AC
  Filled 2020-09-10: qty 1000

## 2020-09-10 MED ORDER — LIDOCAINE HCL (PF) 1 % IJ SOLN
INTRAMUSCULAR | Status: DC | PRN
  Administered 2020-09-10: 4 mL

## 2020-09-10 MED ORDER — FENTANYL CITRATE (PF) 100 MCG/2ML IJ SOLN
INTRAMUSCULAR | Status: DC | PRN
  Administered 2020-09-10: 25 ug via INTRAVENOUS

## 2020-09-10 MED ORDER — ASPIRIN 81 MG PO CHEW: 81.0000 mg | CHEWABLE_TABLET | ORAL | Status: DC

## 2020-09-10 MED ORDER — SODIUM CHLORIDE 0.9 % IV SOLN: 250.0000 mL | INTRAVENOUS | Status: DC | PRN

## 2020-09-10 MED ORDER — SODIUM CHLORIDE 0.9 % IV SOLN: INTRAVENOUS | Status: DC

## 2020-09-10 MED ORDER — MIDAZOLAM HCL 2 MG/2ML IJ SOLN
INTRAMUSCULAR | Status: AC
  Filled 2020-09-10: qty 2

## 2020-09-10 MED ORDER — IOHEXOL 350 MG/ML SOLN
INTRAVENOUS | Status: DC | PRN
  Administered 2020-09-10: 55 mL

## 2020-09-10 MED ORDER — SORBITOL 70 % SOLN: 30.0000 mL | Freq: Once | Status: DC

## 2020-09-10 MED ORDER — HEPARIN (PORCINE) 25000 UT/250ML-% IV SOLN
1400.0000 [IU]/h | INTRAVENOUS | Status: DC
  Administered 2020-09-10: 1300 [IU]/h via INTRAVENOUS
  Filled 2020-09-10: qty 250

## 2020-09-10 MED ORDER — SODIUM CHLORIDE 0.9% FLUSH: 3.0000 mL | INTRAVENOUS | Status: DC | PRN

## 2020-09-10 SURGICAL SUPPLY — 9 items
CATH INFINITI 5FR MULTPACK ANG (CATHETERS) ×2 IMPLANT
KIT HEART LEFT (KITS) ×2 IMPLANT
KIT MICROPUNCTURE NIT STIFF (SHEATH) ×2 IMPLANT
PACK CARDIAC CATHETERIZATION (CUSTOM PROCEDURE TRAY) ×2 IMPLANT
SHEATH PINNACLE 5F 10CM (SHEATH) ×2 IMPLANT
SHEATH PROBE COVER 6X72 (BAG) ×2 IMPLANT
TRANSDUCER W/STOPCOCK (MISCELLANEOUS) ×2 IMPLANT
TUBING CIL FLEX 10 FLL-RA (TUBING) ×2 IMPLANT
WIRE EMERALD 3MM-J .035X150CM (WIRE) ×2 IMPLANT

## 2020-09-10 NOTE — Progress Notes (Signed)
Site area: rt groin fa sheah pulled by Tomma Lightning Site Prior to Removal:  Level 0 Pressure Applied For: 20 minutes Manual:   yes Patient Status During Pull:  0 Post Pull Site:  Level 0 Post Pull Instructions Given:  yes Post Pull Pulses Present: rt dp palpable Dressing Applied:  gauze and tegaderm Bedrest begins @ 1440 Comments:

## 2020-09-10 NOTE — Progress Notes (Signed)
PROGRESS NOTE    Jodi Bell  FOY:[id number] DOB: 25-Oct-1960 DOA: 09/08/2020 PCP: [name]., MD   Brief Narrative: 60 year old with past medical history significant for ESRD on hemodialysis, PAD, COPD, RA on chronic steroids, CAD, PAD, diabetes, hypertension on 5 blood pressure medications who presents complaining of worsening shortness of breath the day of admission.  Patient had full hemodialysis the day prior to admission.  She has not missed hemodialysis.  Patient is a current smoker.  Patient was found to be in severe respiratory distress, tachypneic, blood pressure significantly elevated systolic 767.  Chest x-ray shows severe fluid overload and pulmonary edema.  She was started on heparin drip nephrology consulted.  Patient underwent hemodialysis on admission.  After hemodialysis she was transition from BiPAP to nasal cannula oxygen.  Assessment & Plan:   Active Problems:   Pulmonary edema   CHF (congestive heart failure) (HCC)  1-Acute Hypoxic respiratory failure, Acute on Chronic Diastolic Heart Failure, Decompensated: Related to volume overload.  Volume management with HD>  She was only able to tolerate 1.5 hour of HD last night.  Had HD last night.  Continue with BIPAP as needed.  On 2 L oxygen today. Improving.   2-Hypertensive emergency: Continue with Lopressor, clonidine increase to home dose.  Continue with Norvasc and Hydralazine. She was started on nitroglycerin gtt. 7/31.  Anemia; of chronic diseases;  Drop in hb.  Had Blood transfusion 7/31 CT abdomen negative for retroperitoneal bleed.  Occult blood ordered.   COPD: Taper prednisone to home dose.  Continue with inhaler.   NSTEMI; Elevation of troponins;CAD: On heparin drip Received transfusion with HD>  Nitroglycerin gtt started.  Underwent Cath:  "Severe two-vessel coronary artery disease with heavily calcified disease of up to 80% involving the proximal and mid LAD as well as up to 90%  involving the mid RCA. Mild-moderate, non-obstructive disease noted in the LCx territory. Mildly elevated left ventricular filling pressure (LVEDP 15-20 mmHg)." CVTS consulted.   ESRD on hemodialysis She underwent emergency dialysis night of admission. Appreciate nephrology assistance.  Diabetes type 2: Continue with Januvia SSI.   Current smoker: Counseling provided.   Abnormal CT scan finding: Erosion of superior endplate of L3 and inferior endplate of L2 with air in the disc space.  The left L3 pedicle screw extending into the inferior aspect of the disc space.  The adjacent soft tissues are not well assessed due to streak artifact of the hardware.  Discitis/osteomyelitis not excluded at L3-L4. -Patient reports prior history of infection in her back. -ESR at 30 not significantly elevated, , CRP: 5.7  and Blood cultures: No growth to date.  -Plan to get MRI of the lumbar spine  Gallbladder wall thickening and or pericholecystic fluid: She denies abdominal pain, nausea or vomiting.  Monitor for now.  Constipation: Started  MiraLAX, Repeat sorbitol.   Estimated body mass index is 25.56 kg/m as calculated from the following:   Height as of this encounter: $RemoveBeforeD'5\' 2"'[id number]$  (1.575 m).   Weight as of this encounter: 63.4 kg.   DVT prophylaxis: Heparin gtt Code Status: Full Code Family Communication: Care discussed with patient Disposition Plan:  Status is: Inpatient  Remains inpatient appropriate because:IV treatments appropriate due to intensity of illness or inability to take PO  Dispo: The patient is from: Home              Anticipated d/c is to: Home              Patient  currently is not medically stable to d/c.   Difficult to place patient No        Consultants:  Cardiology Nephrology   Procedures:  ECHO  Antimicrobials:    Subjective: She is breathing better. No BM yet.    Objective: Vitals:   09/10/20 0405 09/10/20 0500 09/10/20 0600 09/10/20 0700  BP:   (!) 173/99 (!) 165/81 (!) 155/81  Pulse:  82 85 85  Resp: $Remo'16 11 14 11  'EMUvA$ Temp:  98.2 F (36.8 C)  98 F (36.7 C)  TempSrc:    Oral  SpO2:  100% 100% 100%  Weight:  63.4 kg    Height:        Intake/Output Summary (Last 24 hours) at 09/10/2020 0808 Last data filed at 09/10/2020 0805 Gross per 24 hour  Intake 1351.53 ml  Output 2000 ml  Net -648.47 ml    Filed Weights   09/08/20 1145 09/09/20 0400 09/10/20 0500  Weight: 68 kg 69 kg 63.4 kg    Examination:  General exam: NAD Respiratory system:  BL crackles Cardiovascular system: S 1, S2  RRR Gastrointestinal system: BS present, soft, distended.  Central nervous system: alert, oriented  Extremities: Symmetric power   Data Reviewed: I have personally reviewed following labs and imaging studies  CBC: Recent Labs  Lab 09/08/20 1221 09/09/20 0300 09/09/20 1204 09/10/20 0251  WBC 7.1 5.2  --  4.1  NEUTROABS 4.7  --   --   --   HGB 9.2* 7.5* 8.2* 9.2*  HCT 28.1* 22.8* 24.3* 27.0*  MCV 101.1* 100.4*  --  96.1  PLT 243 171  --  017    Basic Metabolic Panel: Recent Labs  Lab 09/08/20 1221 09/09/20 0300 09/10/20 0251  NA 134* 135 133*  K 4.4 3.8 3.5  CL 97* 97* 96*  CO2 $Re'29 31 27  'iga$ GLUCOSE 103* 82 198*  BUN $Re'18 10 8  'GFM$ CREATININE 3.86* 3.14* 2.71*  CALCIUM 10.3 9.2 9.9  MG 1.9  --   --     GFR: Estimated Creatinine Clearance: 19.3 mL/min (A) (by C-G formula based on SCr of 2.71 mg/dL (H)). Liver Function Tests: No results for input(s): AST, ALT, ALKPHOS, BILITOT, PROT, ALBUMIN in the last 168 hours. No results for input(s): LIPASE, AMYLASE in the last 168 hours. No results for input(s): AMMONIA in the last 168 hours. Coagulation Profile: Recent Labs  Lab 09/08/20 1800  INR 1.1    Cardiac Enzymes: No results for input(s): CKTOTAL, CKMB, CKMBINDEX, TROPONINI in the last 168 hours. BNP (last 3 results) No results for input(s): PROBNP in the last 8760 hours. HbA1C: Recent Labs    09/09/20 0300  HGBA1C 5.2     CBG: Recent Labs  Lab 09/09/20 0613 09/09/20 1747 09/09/20 2103 09/10/20 0602  GLUCAP 82 235* 184* 154*    Lipid Profile: Recent Labs    09/10/20 0251  CHOL 193  HDL 69  LDLCALC 112*  TRIG 60  CHOLHDL 2.8   Thyroid Function Tests: No results for input(s): TSH, T4TOTAL, FREET4, T3FREE, THYROIDAB in the last 72 hours. Anemia Panel: No results for input(s): VITAMINB12, FOLATE, FERRITIN, TIBC, IRON, RETICCTPCT in the last 72 hours. Sepsis Labs: No results for input(s): PROCALCITON, LATICACIDVEN in the last 168 hours.  Recent Results (from the past 240 hour(s))  Resp Panel by RT-PCR (Flu A&B, Covid) Nasopharyngeal Swab     Status: None   Collection Time: 09/08/20  1:21 PM   Specimen: Nasopharyngeal Swab; Nasopharyngeal(NP) swabs in vial  transport medium  Result Value Ref Range Status   SARS Coronavirus 2 by RT PCR NEGATIVE NEGATIVE Final    Comment: (NOTE) SARS-CoV-2 target nucleic acids are NOT DETECTED.  The SARS-CoV-2 RNA is generally detectable in upper respiratory specimens during the acute phase of infection. The lowest concentration of SARS-CoV-2 viral copies this assay can detect is 138 copies/mL. A negative result does not preclude SARS-Cov-2 infection and should not be used as the sole basis for treatment or other patient management decisions. A negative result may occur with  improper specimen collection/handling, submission of specimen other than nasopharyngeal swab, presence of viral mutation(s) within the areas targeted by this assay, and inadequate number of viral copies(<138 copies/mL). A negative result must be combined with clinical observations, patient history, and epidemiological information. The expected result is Negative.  Fact Sheet for Patients:  EntrepreneurPulse.com.au  Fact Sheet for Healthcare Providers:  IncredibleEmployment.be  This test is no t yet approved or cleared by the [place] FDA  and  has been authorized for detection and/or diagnosis of SARS-CoV-2 by FDA under an Emergency Use Authorization (EUA). This EUA will remain  in effect (meaning this test can be used) for the duration of the COVID-19 declaration under Section 564(b)(1) of the Act, 21 U.S.C.section 360bbb-3(b)(1), unless the authorization is terminated  or revoked sooner.       Influenza A by PCR NEGATIVE NEGATIVE Final   Influenza B by PCR NEGATIVE NEGATIVE Final    Comment: (NOTE) The Xpert Xpress SARS-CoV-2/FLU/RSV plus assay is intended as an aid in the diagnosis of influenza from Nasopharyngeal swab specimens and should not be used as a sole basis for treatment. Nasal washings and aspirates are unacceptable for Xpert Xpress SARS-CoV-2/FLU/RSV testing.  Fact Sheet for Patients: EntrepreneurPulse.com.au  Fact Sheet for Healthcare Providers: IncredibleEmployment.be  This test is not yet approved or cleared by the [place] FDA and has been authorized for detection and/or diagnosis of SARS-CoV-2 by FDA under an Emergency Use Authorization (EUA). This EUA will remain in effect (meaning this test can be used) for the duration of the COVID-19 declaration under Section 564(b)(1) of the Act, 21 U.S.C. section 360bbb-3(b)(1), unless the authorization is terminated or revoked.  Performed at [facility] [address] [place], [place] [address]           Radiology Studies: CT ABDOMEN PELVIS WO CONTRAST  Result Date: 09/09/2020 CLINICAL DATA:  Anemia.  Abdominal pain. EXAM: CT ABDOMEN AND PELVIS WITHOUT CONTRAST TECHNIQUE: Multidetector CT imaging of the abdomen and pelvis was performed following the standard protocol without IV contrast. COMPARISON:  None FINDINGS: Lower chest: Bilateral pleural effusions with underlying atelectasis. Cardiomegaly. Three-vessel coronary artery disease. Hepatobiliary: Gallbladder wall thickening and/or  pericholecystic fluid identified. Liver is unremarkable on unenhanced images. Pancreas: Unremarkable. No pancreatic ductal dilatation or surrounding inflammatory changes. Spleen: Normal in size without focal abnormality. Adrenals/Urinary Tract: Adrenal glands are unremarkable. Bilateral renal cysts are identified. Symmetric perinephric stranding is likely nonacute. No hydronephrosis. No renal stones or suspicious masses are identified. No ureterectasis or ureteral stones. The bladder is decompressed but grossly unremarkable. Stomach/Bowel: The stomach and small bowel are unremarkable. Scattered colonic diverticulosis is identified without diverticulitis. Moderate fecal loading seen throughout the colon. The appendix is normal in appearance. Vascular/Lymphatic: Calcified atherosclerosis is seen in the tortuous nonaneurysmal aorta. No adenopathy. Reproductive: Uterus and bilateral adnexa are unremarkable. Other: Increased attenuation in the subcutaneous fat consistent with volume overload. A small amount of fluid in the left  pericolic gutter is likely due to volume overload. No free air. No fluid collection. Musculoskeletal: Pedicle rods and screws are seen at L3 and L4. A disc spacer device is seen at L3-4. Erosive changes are seen at the superior endplate of L3 and the inferior endplate of L2. There is focal kyphosis in this region. There is air in the disc space in this region. The left pedicle screw at L3 extends superior to the eroded superior endplate into the inferior aspect of the disc space. IMPRESSION: 1. Erosion of the superior endplate of L3 and the inferior endplate of L2 with air in the disc space. The left L3 pedicle screw extends into the inferior aspect of the disc space. The adjacent soft tissues are not well assessed due to streak artifact off the hardware. Discitis/osteomyelitis not excluded at L3-4. 2. Bilateral pleural effusions with underlying atelectasis. Cardiomegaly. Three-vessel coronary  artery disease. 3. Gallbladder wall thickening and/or pericholecystic fluid. A right upper quadrant ultrasound could better evaluate the gallbladder. 4. Moderate fecal loading in the colon. 5. Calcified atherosclerosis in the abdominal aorta. 6. Volume overload with increased attenuation in the subcutaneous fat diffusely and a small amount of fluid in the pericolic gutter. These results will be called to the ordering clinician or representative by the Radiologist Assistant, and communication documented in the PACS or [employer]. Electronically Signed   By: Dorise Bullion III M.D   On: 09/09/2020 14:38   DG Chest 1 View  Result Date: 09/09/2020 CLINICAL DATA:  CHF.  Shortness of breath. EXAM: CHEST  1 VIEW COMPARISON:  September 08, 2020 FINDINGS: No pneumothorax. Stable cardiomegaly. The hila and mediastinum are unchanged. Diffuse interstitial opacities in the lungs are improved in the interval but do persist. Probable small left effusion with underlying atelectasis. No other interval changes or acute abnormalities. IMPRESSION: Stable cardiomegaly. Persistent but improving pulmonary edema. Persistent small left effusion with underlying atelectasis. Electronically Signed   By: Dorise Bullion III M.D   On: 09/09/2020 09:18   DG Chest 2 View  Result Date: 09/08/2020 CLINICAL DATA:  Shortness of breath beginning this morning EXAM: CHEST - 2 VIEW COMPARISON:  08/22/2020 FINDINGS: Lateral view degraded by patient arm position. Cervical spine fixation. Midline trachea. Moderate cardiomegaly. Atherosclerosis in the transverse aorta. No pleural effusion or pneumothorax. Moderate interstitial edema is asymmetric, greater on the right. Felt to be slightly increased. IMPRESSION: Cardiomegaly with moderate interstitial edema, slightly increased. Aortic Atherosclerosis (ICD10-I70.0). Electronically Signed   By: Abigail Miyamoto M.D.   On: 09/08/2020 13:25   ECHOCARDIOGRAM COMPLETE  Result Date: 09/09/2020     ECHOCARDIOGRAM REPORT   Patient Name:   Jodi Bell Date of Exam: 09/09/2020 Medical Rec #:  [id number]        Height:       62.0 in Accession #:    [id number]       Weight:       152.1 lb Date of Birth:  [birth year]         BSA:          1.702 m Patient Age:    89 years         BP:           161/94 mmHg Patient Gender: F                HR:           82 bpm. Exam Location:  Inpatient Procedure: 2D Echo Indications:    NSTEMI  History:        Patient has prior history of Echocardiogram examinations, most                 recent 07/04/2020. CHF, end stage renal disease and COPD; Risk                 Factors:Sleep Apnea, Hypertension and Diabetes.  Sonographer:    Delcie Roch Referring Phys: [id number] DAYNA N DUNN IMPRESSIONS  1. Left ventricular ejection fraction, by estimation, is 60%. The left ventricle has normal function. The left ventricle are septal wall motion abnormalities described below. There is mild concentric left ventricular hypertrophy with severe hypetrophy of the basal-septal segment. Left ventricular diastolic parameters are indeterminate in the setting of significant mitral annular calcification but suggestive of increase left atrial pressure.  2. Right ventricular systolic function is normal. The right ventricular size is normal. There is normal pulmonary artery systolic pressure.  3. A small pericardial effusion is present. The pericardial effusion is circumferential.  4. The mitral valve is degenerative. Mild mitral valve regurgitation. Severe mitral annular calcification.  5. The aortic valve is tricuspid. There is mild calcification of the aortic valve. There is moderate thickening of the aortic valve. Aortic valve regurgitation is not visualized. Mild aortic valve stenosis. Aortic valve mean gradient measures 24.0 mmHg.  Aortic valve Vmax measures 3.26 m/s.  6. Left atrial size was severely dilated. Comparison(s): A prior study was performed on 07/04/20. Prior images reviewed side by side.  Septal hypokinesis appears new. FINDINGS  Left Ventricle: Left ventricular ejection fraction, by estimation, is 60%. The left ventricle has normal function. The left ventricle demonstrates regional wall motion abnormalities. The left ventricular internal cavity size was normal in size. There is  mild concentric left ventricular hypertrophy of the basal-septal segment. Left ventricular diastolic parameters are indeterminate.  LV Wall Scoring: The anterior septum, mid inferoseptal segment, and basal inferoseptal segment are hypokinetic. Right Ventricle: The right ventricular size is normal. No increase in right ventricular wall thickness. Right ventricular systolic function is normal. There is normal pulmonary artery systolic pressure. The tricuspid regurgitant velocity is 2.69 m/s, and  with an assumed right atrial pressure of 3 mmHg, the estimated right ventricular systolic pressure is 31.9 mmHg. Left Atrium: Left atrial size was severely dilated. Right Atrium: Right atrial size was normal in size. Pericardium: A small pericardial effusion is present. The pericardial effusion is circumferential. Mitral Valve: The mitral valve is degenerative in appearance. Severe mitral annular calcification. Mild mitral valve regurgitation. Tricuspid Valve: The tricuspid valve is normal in structure. Tricuspid valve regurgitation is trivial. No evidence of tricuspid stenosis. Aortic Valve: The aortic valve is tricuspid. There is mild calcification of the aortic valve. There is moderate thickening of the aortic valve. Aortic valve regurgitation is not visualized. Mild aortic stenosis is present. Aortic valve mean gradient measures 24.0 mmHg. Aortic valve peak gradient measures 42.5 mmHg. Aortic valve area, by VTI measures 1.06 cm. Pulmonic Valve: The pulmonic valve was normal in structure. Pulmonic valve regurgitation is not visualized. No evidence of pulmonic stenosis. Aorta: The aortic root and ascending aorta are structurally  normal, with no evidence of dilitation. IAS/Shunts: The atrial septum is grossly normal.  LEFT VENTRICLE PLAX 2D LVIDd:         4.90 cm  Diastology LVIDs:         3.20 cm  LV e' lateral:   7.18 cm/s LV PW:         1.50 cm  LV E/e' lateral: 14.5 LV IVS:        1.30 cm LVOT diam:     1.70 cm LV SV:         69 LV SV Index:   41 LVOT Area:     2.27 cm  RIGHT VENTRICLE             IVC RV S prime:     14.40 cm/s  IVC diam: 1.30 cm TAPSE (M-mode): 3.0 cm LEFT ATRIUM             Index       RIGHT ATRIUM           Index LA diam:        3.90 cm 2.29 cm/m  RA Area:     16.50 cm LA Vol (A2C):   75.5 ml 44.37 ml/m RA Volume:   43.70 ml  25.68 ml/m LA Vol (A4C):   85.4 ml 50.18 ml/m LA Biplane Vol: 81.8 ml 48.07 ml/m  AORTIC VALVE AV Area (Vmax):    1.04 cm AV Area (Vmean):   1.02 cm AV Area (VTI):     1.06 cm AV Vmax:           326.00 cm/s AV Vmean:          230.000 cm/s AV VTI:            0.654 m AV Peak Grad:      42.5 mmHg AV Mean Grad:      24.0 mmHg LVOT Vmax:         150.00 cm/s LVOT Vmean:        103.000 cm/s LVOT VTI:          0.306 m LVOT/AV VTI ratio: 0.47  AORTA Ao Asc diam: 3.50 cm MITRAL VALVE                TRICUSPID VALVE MV Area (PHT): 3.91 cm     TR Peak grad:   28.9 mmHg MV Decel Time: 194 msec     TR Vmax:        269.00 cm/s MV E velocity: 104.00 cm/s MV A velocity: 143.00 cm/s  SHUNTS MV E/A ratio:  0.73         Systemic VTI:  0.31 m                             Systemic Diam: 1.70 cm Rudean Haskell MD Electronically signed by Rudean Haskell MD Signature Date/Time: 09/09/2020/4:46:48 PM    Final         Scheduled Meds:  sodium chloride   Intravenous Once   allopurinol  100 mg Oral QPM   amLODipine  10 mg Oral Daily   aspirin EC  81 mg Oral Daily   calcium acetate  2,001 mg Oral TID WC   chlorhexidine  15 mL Mouth Rinse BID   Chlorhexidine Gluconate Cloth  6 each Topical Q0600   cholecalciferol  1,000 Units Oral QPM   [START ON 09/11/2020] cinacalcet  30 mg Oral Q T,Th,Sa-HD    cloNIDine  0.3 mg Oral TID   ferrous sulfate  325 mg Oral Once per day on Mon Wed Fri   fluticasone  1 spray Each Nare Daily   folic acid  1 mg Oral Daily   furosemide  80 mg Intravenous BID   hydrALAZINE  100 mg Oral Q8H   insulin aspart  0-6 Units Subcutaneous TID  WC   ipratropium-albuterol  3 mL Nebulization BID   irbesartan  300 mg Oral Daily   labetalol  400 mg Oral TID   linagliptin  5 mg Oral Daily   loratadine  10 mg Oral Daily   mouth rinse  15 mL Mouth Rinse q12n4p   montelukast  10 mg Oral Q supper   nicotine  7 mg Transdermal Daily   pantoprazole  40 mg Oral Daily   polyethylene glycol  17 g Oral Daily   polyvinyl alcohol  1 drop Both Eyes TID AC & HS   predniSONE  20 mg Oral Q breakfast   saccharomyces boulardii  250 mg Oral Daily   sodium chloride flush  3 mL Intravenous Q12H   Tofacitinib Citrate  5 mg Oral QHS   umeclidinium bromide  1 puff Inhalation Daily   zolpidem  5 mg Oral QHS   Continuous Infusions:  sodium chloride     heparin 1,300 Units/hr (09/09/20 1517)   nitroGLYCERIN 45 mcg/min (09/10/20 0541)     LOS: 2 days    Time spent: 35 minutes.     Elmarie Shiley, MD [facility]   If 7PM-7AM, please contact night-coverage [email or web address]  09/10/2020, 8:08 AM

## 2020-09-10 NOTE — Consult Note (Addendum)
[address]       [place] [address]             [phone]        [name] Medical Record [id number] Date of Birth: [birth year]  Referring: Dr. 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 In the ED, she was found to be in severe respiratory distress, hypertensive with SBP in the 180-190's, and tachypneic. She was placed on supplemental oxygen. Chest x ray done showed stable cardiomegaly, persistent but improving pulmonary edema, persistent small left pleural effusion, and atelectasis. CT of abdomen and pelvis showed bilateral pleural effusions with atelectasis, cardiomegaly, coronary artery disease, gallbladder wall thickening and/or pericholecystic fluid, calcified atherosclerosis in the abdominal aorta, and erosion of the superior endplate of L3 and the inferior endplate of L2 with air in the disc space (discitis/osteomyelitis cannot be excluded). Initial Troponin I (high sensitivity) was 40 and max is 1,499. She ruled in for a NSTEMI. She was started on IV Heparin. Of note, CBC showed H and H to be 9.2 and 28.1.Patient underwent a cardiac catheterization on 09/10/2020 that showed 80% stenosis of the proximal and mid LAD as well as up to 90% stenosis of the mid RCA. Echo done 09/09/2020 showed LVEF 60%, small pericardial effusion, mild AS, mild MR, and severe mitral annular calcification. Of note,  echo done May 2022 showed mild to moderate MR and MS, and mild AS. Dr. Cyndia Bent has been consulted for consideration of coronary artery bypass grafting surgery.   Current Activity/ Functional Status: Patient is independent with mobility/ambulation, transfers, ADL's, IADL's.   Zubrod Score: At the time of surgery this patient's most appropriate activity status/level should be described as: '[]'$     0    Normal activity, no symptoms '[x]'$     1    Restricted in physical strenuous activity but ambulatory, able to do out light work '[]'$     2    Ambulatory and capable of self care, unable to do work activities, up and about more than 50%  Of the time                            '[]'$     3    Only limited self care, in bed greater than 50% of waking hours '[]'$     4    Completely disabled, no self care, confined to bed or chair '[]'$     5    Moribund  Past Medical History:  Diagnosis Date   Anemia of chronic disease    Anxiety    Arthritis    oa and psoriatic ra   Asthma    Back pain, chronic    lower back   Candida infection finished tx 2 days ago   Chronic insomnia    COPD (chronic obstructive pulmonary disease) ([place])  Coronary artery calcification seen on CT scan    Diabetes mellitus    type 2   Eczema    ESRD on dialysis [facility])    Essential hypertension    GERD (gastroesophageal reflux disease)    Gout    History of hiatal hernia    Hypoalbuminemia    Hyponatremia    Mild aortic stenosis    Mitral regurgitation    Mitral stenosis    Obesity    PAD (peripheral artery disease) ([place])    a. externial iliac calcification seen on CT 04/2020.   Pneumonia few yrs ago x 2   Sleep apnea    Tobacco abuse    Upper GI bleed 05/2019  Cervical myelopathy  Past Surgical History:  Procedure Laterality Date   BACK SURGERY  2016   lower back fusion with cage   BIOPSY  09/23/2017   Procedure: BIOPSY;  Surgeon: Wonda Horner, MD;  Location: [facility];  Service: Endoscopy;;   BIOPSY  05/19/2019    Procedure: BIOPSY;  Surgeon: Otis Brace, MD;  Location: [place];  Service: Gastroenterology;;   CESAREAN SECTION  1990   x 1    COLONOSCOPY WITH PROPOFOL N/A 09/23/2017   Procedure: COLONOSCOPY WITH PROPOFOL Hemostatic clips placed;  Surgeon: Wonda Horner, MD;  Location: [facility];  Service: Endoscopy;  Laterality: N/A;   DILATION AND CURETTAGE OF UTERUS  1988   ESOPHAGOGASTRODUODENOSCOPY (EGD) WITH PROPOFOL N/A 09/23/2017   Procedure: ESOPHAGOGASTRODUODENOSCOPY (EGD) WITH PROPOFOL;  Surgeon: Wonda Horner, MD;  Location: [facility];  Service: Endoscopy;  Laterality: N/A;   ESOPHAGOGASTRODUODENOSCOPY (EGD) WITH PROPOFOL N/A 05/19/2019   Procedure: ESOPHAGOGASTRODUODENOSCOPY (EGD) WITH PROPOFOL;  Surgeon: Otis Brace, MD;  Location: [facility];  Service: Gastroenterology;  Laterality: N/A;   GIVENS CAPSULE STUDY N/A 05/19/2019   Procedure: GIVENS CAPSULE STUDY;  Surgeon: Otis Brace, MD;  Location: [place];  Service: Gastroenterology;  Laterality: N/A;   HOT HEMOSTASIS N/A 05/19/2019   Procedure: HOT HEMOSTASIS (ARGON PLASMA COAGULATION/BICAP);  Surgeon: Otis Brace, MD;  Location: [facility] ENDOSCOPY;  Service: Gastroenterology;  Laterality: N/A;   PARTIAL KNEE ARTHROPLASTY Left 11/12/2017   Procedure: left unicompartmental arthroplasty-medial;  Surgeon: Paralee Cancel, MD;  Location: [facility];  Service: Orthopedics;  Laterality: Left;  6mn   SUBMUCOSAL INJECTION  09/23/2017   Procedure: SUBMUCOSAL INJECTION;  Surgeon: GWonda Horner MD;  Location: [facility];  Service: Endoscopy;;  in colon   TUBAL LIGATION  1990    Social History   Tobacco Use  Smoking Status Every Day   Packs/day: 0.50   Years: 40.00   Pack years: 20.00   Types: Cigarettes  Smokeless Tobacco Never  She has quit smoking in the past but is unable to fully stop. She states she smokes about 1/2 pack per day now.  Social History   Substance and Sexual Activity  Alcohol Use Yes    Comment: occ  She is disabled. She has 3 children (1 son and 2 daughters) who live locally, as well as 5 grand children.  Allergies: Allergies  Allergen Reactions   3-Methyl-2-Benzothiazolinone Hydrazone Other (See Comments)    Muscle weakness muscle cramping in legs Other reaction(s): Myalgias (Muscle Pain)   Banana Anaphylaxis and Swelling    TONGUE AND MOUTH SWELLING   Black Walnut Flavor Anaphylaxis    Walnuts   Hazelnut ([place]) Allergy Skin Test Anaphylaxis    Hazelnuts   Leflunomide And Related Other (See Comments)    Severe Headache   Lisinopril Swelling  Angioedema  Swelling of the face   No [employer]alt[employer] Food Allergies Anaphylaxis    Spicy Mustard 4.7.2021 Pt reports that she eats Regular Yellow Mustard   Other Other (See Comments), Anaphylaxis and Swelling    Cosentyx= angioedema  Other reaction(s): Facial Edema (intolerance) Mouth itches and tongue swelling Hazel nuts and pecans also Walnuts Walnuts Renal failure Other reaction(s): Other Serotonin Syndrome  Serotonin syndrome  Tremors Other reaction(s): Other Tremors Muscle weakness muscle cramping in legs Other reaction(s): Myalgias (Muscle Pain) Mouth itches and tongue swelling Hazel nuts and pecans also Walnuts    Pecan Extract Allergy Skin Test Anaphylaxis    Pecans    Pecan Nut (Diagnostic) Anaphylaxis    Pecans   Trazodone And Nefazodone Other (See Comments)    Serotonin Syndrome  Tremors   Adalimumab Other (See Comments)    Blisters on abdomen =humira   Escitalopram Oxalate Other (See Comments)    Hand problems - Serotonin Syndrome     Ezetimibe Other (See Comments)    myalgias cramps    Secukinumab Swelling    Cosentyx = angiodema    Statins Other (See Comments)    Leg pains and weakness   Ibuprofen Other (See Comments)    Renal Problems    Current Facility-Administered Medications  Medication Dose Route Frequency Provider Last Rate Last Admin   0.9 %  sodium chloride  infusion (Manually program via Guardrails IV Fluids)   Intravenous Once Roney Jaffe, MD       0.9 %  sodium chloride infusion  250 mL Intravenous PRN Wynetta Fines T, MD       acetaminophen (TYLENOL) tablet 1,000 mg  1,000 mg Oral Q6H PRN Wynetta Fines T, MD   1,000 mg at 09/09/20 2218   albuterol (VENTOLIN HFA) 108 (90 Base) MCG/ACT inhaler 1-2 puff  1-2 puff Inhalation Q6H PRN Wynetta Fines T, MD       allopurinol (ZYLOPRIM) tablet 100 mg  100 mg Oral QPM Wynetta Fines T, MD   100 mg at 09/09/20 1736   amLODipine (NORVASC) tablet 10 mg  10 mg Oral Daily Wynetta Fines T, MD   10 mg at 09/10/20 [id number]   aspirin EC tablet 81 mg  81 mg Oral Daily Dunn, Dayna N, PA-C   81 mg at 09/10/20 0937   calcium acetate (PHOSLO) capsule 2,001 mg  2,001 mg Oral TID WC Wynetta Fines T, MD   2,001 mg at 09/10/20 0938   chlorhexidine (PERIDEX) 0.12 % solution 15 mL  15 mL Mouth Rinse BID Regalado, Belkys A, MD   15 mL at 09/09/20 2149   Chlorhexidine Gluconate Cloth 2 % PADS 6 each  6 each Topical Q0600 Roney Jaffe, MD   6 each at 09/10/20 0940   cholecalciferol (VITAMIN D3) tablet 1,000 Units  1,000 Units Oral QPM Wynetta Fines T, MD   1,000 Units at 09/09/20 1736   [START ON 09/11/2020] cinacalcet (SENSIPAR) tablet 30 mg  30 mg Oral Q T,Th,Sa-HD Wynetta Fines T, MD       cloNIDine (CATAPRES) tablet 0.3 mg  0.3 mg Oral TID Regalado, Belkys A, MD   0.3 mg at 09/10/20 0940   cyclobenzaprine (FLEXERIL) tablet 10 mg  10 mg Oral TID PRN Wynetta Fines T, MD   10 mg at 09/09/20 2218   ferrous sulfate tablet 325 mg  325 mg Oral Once per day on Mon Wed Fri Zhang, Ping T, MD       fluticasone [facility]) 50 MCG/ACT nasal spray  1 spray  1 spray Each Nare Daily Lequita Halt, MD   1 spray at [id number]   folic acid (FOLVITE) tablet 1 mg  1 mg Oral Daily Wynetta Fines T, MD   1 mg at 09/10/20 [id number]   furosemide (LASIX) tablet 80 mg  80 mg Oral BID Minus Breeding, MD       gabapentin (NEURONTIN) capsule 300 mg  300 mg Oral QHS PRN Wynetta Fines  T, MD       heparin ADULT infusion 100 units/mL (25000 units/279m)  1,300 Units/hr Intravenous Continuous URalph Dowdy RPH 13 mL/hr at 09/10/20 1023 1,300 Units/hr at 09/10/20 1023   hydrALAZINE (APRESOLINE) injection 10 mg  10 mg Intravenous Q20 Min PRN End, CHarrell Gave MD       hydrALAZINE (APRESOLINE) tablet 100 mg  100 mg Oral Q8H ZWynetta FinesT, MD   100 mg at 09/10/20 0943   insulin aspart (novoLOG) injection 0-6 Units  0-6 Units Subcutaneous TID WC ZLequita Halt MD   2 Units at 09/09/20 1750   ipratropium-albuterol (DUONEB) 0.5-2.5 (3) MG/3ML nebulizer solution 3 mL  3 mL Nebulization BID Regalado, Belkys A, MD       irbesartan (AVAPRO) tablet 300 mg  300 mg Oral Daily ZWynetta FinesT, MD   300 mg at 09/10/20 [id number]  labetalol (NORMODYNE) injection 10 mg  10 mg Intravenous Q10 min PRN End, CHarrell Gave MD       labetalol (NORMODYNE) tablet 400 mg  400 mg Oral TID Regalado, Belkys A, MD   400 mg at 09/10/20 0939   linagliptin (TRADJENTA) tablet 5 mg  5 mg Oral Daily ZWynetta FinesT, MD   5 mg at 09/10/20 0940   loratadine (CLARITIN) tablet 10 mg  10 mg Oral Daily ZWynetta FinesT, MD   10 mg at 09/10/20 0939   LORazepam (ATIVAN) tablet 1 mg  1 mg Oral Q8H PRN ZLequita Halt MD       MEDLINE mouth rinse  15 mL Mouth Rinse q12n4p Regalado, Belkys A, MD   15 mL at 09/09/20 1219   montelukast (SINGULAIR) tablet 10 mg  10 mg Oral Q supper ZWynetta FinesT, MD   10 mg at 09/09/20 1736   nicotine (NICODERM CQ - dosed in mg/24 hr) patch 7 mg  7 mg Transdermal Daily ZWynetta FinesT, MD   7 mg at 09/10/20 0941   nitroGLYCERIN 50 mg in dextrose 5 % 250 mL (0.2 mg/mL) infusion  5-200 mcg/min Intravenous Titrated Dunn, Dayna N, PA-C 15 mL/hr at 09/10/20 1353 50 mcg/min at 09/10/20 1353   ondansetron (ZOFRAN) injection 4 mg  4 mg Intravenous Q6H PRN ZWynetta FinesT, MD       oxyCODONE (Oxy IR/ROXICODONE) immediate release tablet 15 mg  15 mg Oral TID PRN ZLequita Halt MD       pantoprazole (PROTONIX) EC tablet 40  mg  40 mg Oral Daily ZWynetta FinesT, MD   40 mg at 09/10/20 0939   polyethylene glycol (MIRALAX / GLYCOLAX) packet 17 g  17 g Oral Daily Regalado, Belkys A, MD       polyvinyl alcohol (LIQUIFILM TEARS) 1.4 % ophthalmic solution 1 drop  1 drop Both Eyes TID AC & HS ZWynetta FinesT, MD       predniSONE (DELTASONE) tablet 20 mg  20 mg Oral Q breakfast Regalado, Belkys A, MD   20 mg at 09/10/20 0939   saccharomyces boulardii (FLORASTOR) capsule 250  mg  250 mg Oral Daily Wynetta Fines T, MD   250 mg at 09/10/20 [id number]   sodium chloride flush (NS) 0.9 % injection 3 mL  3 mL Intravenous Q12H Wynetta Fines T, MD   3 mL at 09/09/20 0855   sodium chloride flush (NS) 0.9 % injection 3 mL  3 mL Intravenous PRN Wynetta Fines T, MD       sorbitol 70 % solution 30 mL  30 mL Oral Once Regalado, Belkys A, MD       Tofacitinib Citrate TABS 5 mg  5 mg Oral QHS Wynetta Fines T, MD       umeclidinium bromide (INCRUSE ELLIPTA) 62.5 MCG/INH 1 puff  1 puff Inhalation Daily Wynetta Fines T, MD   1 puff at 09/10/20 1002   zolpidem (AMBIEN) tablet 5 mg  5 mg Oral QHS Wynetta Fines T, MD   5 mg at 09/09/20 2149    Medications Prior to Admission  Medication Sig Dispense Refill Last Dose   acetaminophen (TYLENOL) 500 MG tablet Take 1,000 mg by mouth every 6 (six) hours as needed for mild pain.   Past Week   albuterol (PROVENTIL) (2.5 MG/3ML) 0.083% nebulizer solution Take 2.5 mg by nebulization as needed for wheezing or shortness of breath.   Past Week   allopurinol (ZYLOPRIM) 100 MG tablet Take 100 mg by mouth every evening.   Past Week   amLODipine (NORVASC) 10 MG tablet Take 10 mg by mouth daily.   Past Week   calcium acetate (PHOSLO) 667 MG capsule Take 3 capsules by mouth in the morning and at bedtime. Take 2 capsules with snacks.   Past Week   carboxymethylcellulose 1 % ophthalmic solution Place 1 drop into both eyes 4 (four) times daily.   Past Week   cetirizine (ZYRTEC) 10 MG tablet Take 10 mg by mouth 2 (two) times daily as needed  (hives).    Past Week   Cholecalciferol (VITAMIN D3) 10 MCG (400 UNIT) tablet Take 400 Units by mouth every evening.   Past Week   cloNIDine (CATAPRES) 0.3 MG tablet Take 1 tablet (0.3 mg total) by mouth 3 (three) times daily.   Past Week   cyclobenzaprine (FLEXERIL) 10 MG tablet Take 10 mg by mouth 3 (three) times daily as needed for muscle spasms.   Past Week   ELDERBERRY PO Take 2 drops by mouth daily.   Past Week   ferrous sulfate 325 (65 FE) MG tablet Take 325 mg by mouth 3 (three) times a week. TUE THUR SAT   Past Week   fluticasone (FLONASE) 50 MCG/ACT nasal spray Place 1 spray into both nostrils daily.   Past Week   folic acid (FOLVITE) [id number] MCG tablet Take 400 mcg by mouth daily.    Past Week   furosemide (LASIX) 40 MG tablet Take 40 mg by mouth 2 (two) times daily.    Past Week   gabapentin (NEURONTIN) 300 MG capsule Take 1 capsule (300 mg total) by mouth at bedtime as needed (Neuropathic pain). (Patient taking differently: Take 300 mg by mouth as needed (pain).) 30 capsule 2 Past Week   hydrALAZINE (APRESOLINE) 100 MG tablet Take 1 tablet (100 mg total) by mouth every 8 (eight) hours. 90 tablet 0 Past Week   isosorbide mononitrate (IMDUR) 60 MG 24 hr tablet Take 1 tablet (60 mg total) by mouth daily. 30 tablet 0 Past Week   labetalol (NORMODYNE) 200 MG tablet Take 400 mg by mouth 3 (three) times  daily.   Past Week   LORazepam (ATIVAN) 1 MG tablet Take 1 mg by mouth every 8 (eight) hours as needed for anxiety.   Past Week   montelukast (SINGULAIR) 10 MG tablet Take 10 mg by mouth daily with supper.    Past Week   multivitamin (RENA-VIT) TABS tablet Take 1 tablet by mouth daily with supper.   Past Week   nitroGLYCERIN (NITROSTAT) 0.4 MG SL tablet Place 0.4 mg under the tongue every 5 (five) minutes as needed for chest pain.   Past Week   olmesartan (BENICAR) 40 MG tablet Take 40 mg by mouth every evening.   Past Week   oxyCODONE (ROXICODONE) 15 MG immediate release tablet Take 15 mg by  mouth 3 (three) times daily as needed for pain.   Past Week   pantoprazole (PROTONIX) 40 MG tablet Take 40 mg by mouth daily.   Past Week   predniSONE (DELTASONE) 5 MG tablet Take 1 tablet (5 mg total) by mouth daily with breakfast. 30 tablet 0 Past Week   Probiotic Product (PROBIOTIC PO) Take 1 capsule by mouth daily.   Past Week   Tiotropium Bromide Monohydrate (SPIRIVA RESPIMAT IN) Inhale 2 puffs into the lungs daily.   Past Week   VENTOLIN HFA 108 (90 Base) MCG/ACT inhaler Inhale 1-2 puffs into the lungs every 6 (six) hours as needed for shortness of breath. wheezing   Past Week   XELJANZ 5 MG TABS Take 5 mg by mouth at bedtime.   Past Week   zolpidem (AMBIEN) 5 MG tablet Take 5 mg by mouth at bedtime.   Past Week   cinacalcet (SENSIPAR) 30 MG tablet Take 30 mg by mouth 3 (three) times a week. Tuesday, Thursday, Saturday. (Patient not taking: Reported on 09/09/2020)   Not Taking   EPINEPHrine (EPIPEN) 0.3 mg/0.3 mL SOAJ injection Inject 0.3 mLs (0.3 mg total) into the muscle once. (Patient taking differently: Inject 0.3 mg into the muscle as needed (for allergic reaction).) 2 Device 1 unk   JANUVIA 25 MG tablet Take 1 tablet (25 mg total) by mouth daily. (Patient not taking: Reported on 09/09/2020) 30 tablet 0 Not Taking    Family History  Problem Relation Age of Onset   Lung cancer Mother    Diabetes Sister    Heart disease Sister    Asthma Daughter    Arthritis Daughter    Arthritis Daughter    Thyroid cancer Other        1/2 sister   Heart disease Other        1/2 sister  Her father died in an MVA when she was 25 months old.  Review of Systems:     Cardiac Review of Systems: Y or  [   N ]= no  Chest Pain [   N ]     Pedal Edema [  Y-on admission R>L]    Palpitations [ N ] Syncope  [ N ]   Presyncope [   N]  General Review of Systems: [Y] = yes [ N ]=no Constitional: recent weight change [  Y]; fatigue [  Y]; nausea [ N ]; night sweats [  ]; fever [  N]; or chills [  N]  Dental: Last Dentist visit: Unknown. Has dentures  Eye : Amaurosis fugax[  N]; Resp: cough [ N ];  wheezing[ N ];  hemoptysis[N  ];  GI:  vomiting[ N ];  dysphagia[  ]; melena[  N];  hematochezia [ N ];  GU:  hematuria[  N];                Skin: rash, swelling[  ];, hair loss[  ];  peripheral edema[  ];  or itching[  ]; Musculosketetal: myalgias[  ];  joint swelling[  ];  joint erythema[  ]; joint pain[  ];  back pain[  ];  Heme/Lymph: anemia[  Y];  Neuro: TIA[ N ];  stroke[  N]; seizures[  N];     Psych:anxiety[  Y];  Endocrine: diabetes[ Y ];  thyroid dysfunction[  N];              Physical Exam: BP (!) 163/69   Pulse 83   Temp 98.3 F (36.8 C) (Oral)   Resp 13   Ht '5\' 2"'$  (1.575 m)   Wt 63.4 kg   SpO2 99%   BMI 25.56 kg/m    General appearance: alert, cooperative, and no distress Head: Normocephalic, without obvious abnormality, atraumatic Neck: supple, symmetrical, trachea midline Resp: clear to auscultation bilaterally Cardio: RRR, Grade III/VI systolic murmur  GI: Soft, non tender, bowel sounds present Extremities: No LE edema. Palp DP on the right. No palpable pulses in left foot but foot is warm. LUE HD access has thrill/bruit Neurologic: Grossly normal  Diagnostic Studies & Laboratory data:     Recent Radiology Findings:   CT ABDOMEN PELVIS WO CONTRAST  Result Date: 09/09/2020 CLINICAL DATA:  Anemia.  Abdominal pain. EXAM: CT ABDOMEN AND PELVIS WITHOUT CONTRAST TECHNIQUE: Multidetector CT imaging of the abdomen and pelvis was performed following the standard protocol without IV contrast. COMPARISON:  None FINDINGS: Lower chest: Bilateral pleural effusions with underlying atelectasis. Cardiomegaly. Three-vessel coronary artery disease. Hepatobiliary: Gallbladder wall thickening and/or pericholecystic fluid identified. Liver is unremarkable on unenhanced images. Pancreas: Unremarkable. No pancreatic ductal dilatation or  surrounding inflammatory changes. Spleen: Normal in size without focal abnormality. Adrenals/Urinary Tract: Adrenal glands are unremarkable. Bilateral renal cysts are identified. Symmetric perinephric stranding is likely nonacute. No hydronephrosis. No renal stones or suspicious masses are identified. No ureterectasis or ureteral stones. The bladder is decompressed but grossly unremarkable. Stomach/Bowel: The stomach and small bowel are unremarkable. Scattered colonic diverticulosis is identified without diverticulitis. Moderate fecal loading seen throughout the colon. The appendix is normal in appearance. Vascular/Lymphatic: Calcified atherosclerosis is seen in the tortuous nonaneurysmal aorta. No adenopathy. Reproductive: Uterus and bilateral adnexa are unremarkable. Other: Increased attenuation in the subcutaneous fat consistent with volume overload. A small amount of fluid in the left pericolic gutter is likely due to volume overload. No free air. No fluid collection. Musculoskeletal: Pedicle rods and screws are seen at L3 and L4. A disc spacer device is seen at L3-4. Erosive changes are seen at the superior endplate of L3 and the inferior endplate of L2. There is focal kyphosis in this region. There is air in the disc space in this region. The left pedicle screw at L3 extends superior to the eroded superior endplate into the inferior aspect of the disc space. IMPRESSION: 1. Erosion of the superior endplate of L3 and the inferior endplate of L2 with air in the disc space. The left L3 pedicle screw extends into the inferior aspect of the disc space. The adjacent soft tissues  are not well assessed due to streak artifact off the hardware. Discitis/osteomyelitis not excluded at L3-4. 2. Bilateral pleural effusions with underlying atelectasis. Cardiomegaly. Three-vessel coronary artery disease. 3. Gallbladder wall thickening and/or pericholecystic fluid. A right upper quadrant ultrasound could better evaluate the  gallbladder. 4. Moderate fecal loading in the colon. 5. Calcified atherosclerosis in the abdominal aorta. 6. Volume overload with increased attenuation in the subcutaneous fat diffusely and a small amount of fluid in the pericolic gutter. These results will be called to the ordering clinician or representative by the Radiologist Assistant, and communication documented in the PACS or [employer]. Electronically Signed   By: Dorise Bullion III M.D   On: 09/09/2020 14:38   DG Chest 1 View  Result Date: 09/09/2020 CLINICAL DATA:  CHF.  Shortness of breath. EXAM: CHEST  1 VIEW COMPARISON:  September 08, 2020 FINDINGS: No pneumothorax. Stable cardiomegaly. The hila and mediastinum are unchanged. Diffuse interstitial opacities in the lungs are improved in the interval but do persist. Probable small left effusion with underlying atelectasis. No other interval changes or acute abnormalities. IMPRESSION: Stable cardiomegaly. Persistent but improving pulmonary edema. Persistent small left effusion with underlying atelectasis. Electronically Signed   By: Dorise Bullion III M.D   On: 09/09/2020 09:18   CARDIAC CATHETERIZATION  Result Date: 09/10/2020 Conclusions: Severe two-vessel coronary artery disease with heavily calcified disease of up to 80% involving the proximal and mid LAD as well as up to 90% involving the mid RCA. Mild-moderate, non-obstructive disease noted in the LCx territory. Mildly elevated left ventricular filling pressure (LVEDP 15-20 mmHg). Recommendations: Given severe two-vessel CAD with heavy, eccentric calcium (including in the proximal LAD) and the patient's history of diabetes, recommend cardiac surgery consultation for CABG. Improve blood pressure control and wean IV nitroglycerin, as tolerated. Nelva Bush, MD [facility]  ECHOCARDIOGRAM COMPLETE  Result Date: 09/09/2020    ECHOCARDIOGRAM REPORT   Patient Name:   Jodi Bell Date of Exam: 09/09/2020 Medical Rec #:  [id number]         Height:       62.0 in Accession #:    [id number]       Weight:       152.1 lb Date of Birth:  [birth year]         BSA:          1.702 m Patient Age:    11 years         BP:           161/94 mmHg Patient Gender: F                HR:           82 bpm. Exam Location:  Inpatient Procedure: 2D Echo Indications:    NSTEMI  History:        Patient has prior history of Echocardiogram examinations, most                 recent 07/04/2020. CHF, end stage renal disease and COPD; Risk                 Factors:Sleep Apnea, Hypertension and Diabetes.  Sonographer:    [name] Referring Phys: [address] [place]  1. Left ventricular ejection fraction, by estimation, is 60%. The left ventricle has normal function. The left ventricle are septal wall motion abnormalities described below. There is mild concentric left ventricular hypertrophy with severe hypetrophy of the basal-septal segment. Left ventricular diastolic parameters are  indeterminate in the setting of significant mitral annular calcification but suggestive of increase left atrial pressure.  2. Right ventricular systolic function is normal. The right ventricular size is normal. There is normal pulmonary artery systolic pressure.  3. A small pericardial effusion is present. The pericardial effusion is circumferential.  4. The mitral valve is degenerative. Mild mitral valve regurgitation. Severe mitral annular calcification.  5. The aortic valve is tricuspid. There is mild calcification of the aortic valve. There is moderate thickening of the aortic valve. Aortic valve regurgitation is not visualized. Mild aortic valve stenosis. Aortic valve mean gradient measures 24.0 mmHg.  Aortic valve Vmax measures 3.26 m/s.  6. Left atrial size was severely dilated. Comparison(s): A prior study was performed on 07/04/20. Prior images reviewed side by side. Septal hypokinesis appears new. FINDINGS  Left Ventricle: Left ventricular ejection fraction, by estimation, is  60%. The left ventricle has normal function. The left ventricle demonstrates regional wall motion abnormalities. The left ventricular internal cavity size was normal in size. There is  mild concentric left ventricular hypertrophy of the basal-septal segment. Left ventricular diastolic parameters are indeterminate.  LV Wall Scoring: The anterior septum, mid inferoseptal segment, and basal inferoseptal segment are hypokinetic. Right Ventricle: The right ventricular size is normal. No increase in right ventricular wall thickness. Right ventricular systolic function is normal. There is normal pulmonary artery systolic pressure. The tricuspid regurgitant velocity is 2.69 m/s, and  with an assumed right atrial pressure of 3 mmHg, the estimated right ventricular systolic pressure is [id number] mmHg. Left Atrium: Left atrial size was severely dilated. Right Atrium: Right atrial size was normal in size. Pericardium: A small pericardial effusion is present. The pericardial effusion is circumferential. Mitral Valve: The mitral valve is degenerative in appearance. Severe mitral annular calcification. Mild mitral valve regurgitation. Tricuspid Valve: The tricuspid valve is normal in structure. Tricuspid valve regurgitation is trivial. No evidence of tricuspid stenosis. Aortic Valve: The aortic valve is tricuspid. There is mild calcification of the aortic valve. There is moderate thickening of the aortic valve. Aortic valve regurgitation is not visualized. Mild aortic stenosis is present. Aortic valve mean gradient measures 24.0 mmHg. Aortic valve peak gradient measures 42.5 mmHg. Aortic valve area, by VTI measures 1.06 cm. Pulmonic Valve: The pulmonic valve was normal in structure. Pulmonic valve regurgitation is not visualized. No evidence of pulmonic stenosis. Aorta: The aortic root and ascending aorta are structurally normal, with no evidence of dilitation. IAS/Shunts: The atrial septum is grossly normal.  LEFT VENTRICLE PLAX 2D  LVIDd:         4.90 cm  Diastology LVIDs:         3.20 cm  LV e' lateral:   7.18 cm/s LV PW:         1.50 cm  LV E/e' lateral: 14.5 LV IVS:        1.30 cm LVOT diam:     1.70 cm LV SV:         69 LV SV Index:   41 LVOT Area:     2.27 cm  RIGHT VENTRICLE             IVC RV S prime:     14.40 cm/s  IVC diam: 1.30 cm TAPSE (M-mode): 3.0 cm LEFT ATRIUM             Index       RIGHT ATRIUM           Index LA diam:  3.90 cm 2.29 cm/m  RA Area:     16.50 cm LA Vol (A2C):   75.5 ml 44.37 ml/m RA Volume:   43.70 ml  25.68 ml/m LA Vol (A4C):   85.4 ml 50.18 ml/m LA Biplane Vol: 81.8 ml 48.07 ml/m  AORTIC VALVE AV Area (Vmax):    1.04 cm AV Area (Vmean):   1.02 cm AV Area (VTI):     1.06 cm AV Vmax:           326.00 cm/s AV Vmean:          230.000 cm/s AV VTI:            0.654 m AV Peak Grad:      42.5 mmHg AV Mean Grad:      24.0 mmHg LVOT Vmax:         150.00 cm/s LVOT Vmean:        103.000 cm/s LVOT VTI:          0.306 m LVOT/AV VTI ratio: 0.47  AORTA Ao Asc diam: 3.50 cm MITRAL VALVE                TRICUSPID VALVE MV Area (PHT): 3.91 cm     TR Peak grad:   28.9 mmHg MV Decel Time: 194 msec     TR Vmax:        269.00 cm/s MV E velocity: 104.00 cm/s MV A velocity: 143.00 cm/s  SHUNTS MV E/A ratio:  0.73         Systemic VTI:  0.31 m                             Systemic Diam: 1.70 cm Rudean Haskell MD Electronically signed by Rudean Haskell MD Signature Date/Time: 09/09/2020/4:46:48 PM    Final      I have independently reviewed the above radiologic studies and discussed with the patient   Recent Lab Findings: Lab Results  Component Value Date   WBC 4.1 09/10/2020   HGB 9.2 (L) 09/10/2020   HCT 27.0 (L) 09/10/2020   PLT 188 09/10/2020   GLUCOSE 198 (H) 09/10/2020   CHOL 193 09/10/2020   TRIG 60 09/10/2020   HDL 69 09/10/2020   LDLCALC 112 (H) 09/10/2020   ALT 12 08/22/2020   AST 16 08/22/2020   NA 133 (L) 09/10/2020   K 3.5 09/10/2020   CL 96 (L) 09/10/2020   CREATININE 2.71  (H) 09/10/2020   BUN 8 09/10/2020   CO2 27 09/10/2020   TSH 4.375 07/03/2020   INR 1.1 09/08/2020   HGBA1C 5.2 09/09/2020   Assessment / Plan:   S/P NSTEMI, coronary artery disease-on Heparin drip. Dr. Cyndia Bent to determine if surgery is her best option 2. ESRD-HD days are M/Wed/Fri. Neprhology has been following. Previously, patient seen at [facility] for the possibility of a renal transplant;however, patient unable to stop smoking. 3. History of COPD, OSA-on Albuterol PRN, Spiriva Inhaler. She does continue to smoke cigarettes. She is not on CPAP 4. History of diabetes mellitus-on Januvia 25 mg daily. Last HGA1C 5.2 5. History of PAD 6. 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 Chronic anemia-on Ferrous sulfate. Has had transfusion during this hospitalization as H/H decreased to 7.5 and 22.8 yesterday. 8. History of chronic back pain-MRI ordered  I  spent 20 minutes counseling the patient face to face.  Lars Pinks PA-C 09/10/2020 2:57 PM    Chart reviewed, patient examined, agree with  above. She presented with recurrent episodes of substernal chest discomfort and shortness of breath. Occurring with exertion and at rest, frequently after smoking cigarettes. Cardiac cath shows diffusely calcified vessels with high grade proximal and mid LAD stenoses and 90% mid RCA. The LCX has mild-moderate non-obstructive disease. Echo shows an LVEF of 60% with mild LVH. The aortic valve is trileaflet, calcified and moderately thickened but the mean gradient is only 24 mm Hg with a DI of 0.47 and an AVA of 1.06 cm2. The leaflets move well. It looks like a mildly stenotic valve visually and I don't think it warrants replacement in this 60 year old with ESRD on HD. A tissue valve would probably give her about the same gradient and would likely calcify quickly. I think she would probably be better off with her own valve and followed. I don't think she is a candidate for a mechanical valve with her comorbidities.  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 MRI of the lumbar spine has been ordered. She has chronic lower back pain and takes oxycodone frequently for that.   I think her coronary disease is most amenable to CABG given the degree of calcification although the calcium extends into the distal LAD and may make sewing a bypass there difficult or impossible. Hopefully there is a spot soft enough to graft. The PDA looks graftable on the right. I would leave the LCX alone. I discussed the operative procedure with the patient  including alternatives, benefits and risks; including but not limited to bleeding, blood transfusion, infection, stroke, myocardial infarction, graft failure, heart block requiring a permanent pacemaker, organ dysfunction, and death. Her operative risk is increased due to all of her comorbidities. Jodi Bell understands and agrees to proceed.  We will schedule surgery for Thursday pending the results of her MRI of the spine and carotid dopplers.

## 2020-09-10 NOTE — Progress Notes (Signed)
Progress Note  Patient Name: Jodi Bell Date of Encounter: 09/10/2020  Primary Cardiologist:   None   Subjective   Some chest discomfort this morning.  No acute SOB.   Inpatient Medications    Scheduled Meds:  sodium chloride   Intravenous Once   allopurinol  100 mg Oral QPM   amLODipine  10 mg Oral Daily   aspirin EC  81 mg Oral Daily   calcium acetate  2,001 mg Oral TID WC   chlorhexidine  15 mL Mouth Rinse BID   Chlorhexidine Gluconate Cloth  6 each Topical Q0600   cholecalciferol  1,000 Units Oral QPM   [START ON 09/11/2020] cinacalcet  30 mg Oral Q T,Th,Sa-HD   cloNIDine  0.3 mg Oral TID   ferrous sulfate  325 mg Oral Once per day on Mon Wed Fri   fluticasone  1 spray Each Nare Daily   folic acid  1 mg Oral Daily   furosemide  80 mg Intravenous BID   hydrALAZINE  100 mg Oral Q8H   insulin aspart  0-6 Units Subcutaneous TID WC   ipratropium-albuterol  3 mL Nebulization BID   irbesartan  300 mg Oral Daily   labetalol  400 mg Oral TID   linagliptin  5 mg Oral Daily   loratadine  10 mg Oral Daily   mouth rinse  15 mL Mouth Rinse q12n4p   montelukast  10 mg Oral Q supper   nicotine  7 mg Transdermal Daily   pantoprazole  40 mg Oral Daily   polyethylene glycol  17 g Oral Daily   polyvinyl alcohol  1 drop Both Eyes TID AC & HS   predniSONE  20 mg Oral Q breakfast   saccharomyces boulardii  250 mg Oral Daily   sodium chloride flush  3 mL Intravenous Q12H   Tofacitinib Citrate  5 mg Oral QHS   umeclidinium bromide  1 puff Inhalation Daily   zolpidem  5 mg Oral QHS   Continuous Infusions:  sodium chloride     heparin 1,300 Units/hr (09/09/20 1517)   nitroGLYCERIN 45 mcg/min (09/10/20 0541)   PRN Meds: sodium chloride, acetaminophen, albuterol, cyclobenzaprine, gabapentin, LORazepam, ondansetron (ZOFRAN) IV, oxyCODONE, sodium chloride flush   Vital Signs    Vitals:   09/10/20 0405 09/10/20 0500 09/10/20 0600 09/10/20 0700  BP:  (!) 173/99 (!) 165/81 (!)  155/81  Pulse:  82 85 85  Resp: '16 11 14 11  '$ Temp:  98.2 F (36.8 C)  98 F (36.7 C)  TempSrc:    Oral  SpO2:  100% 100% 100%  Weight:  63.4 kg    Height:        Intake/Output Summary (Last 24 hours) at 09/10/2020 0940 Last data filed at 09/10/2020 0805 Gross per 24 hour  Intake 1351.53 ml  Output 2000 ml  Net -648.47 ml   Filed Weights   09/08/20 1145 09/09/20 0400 09/10/20 0500  Weight: 68 kg 69 kg 63.4 kg    Telemetry    NSR - Personally Reviewed  ECG    NA - Personally Reviewed  Physical Exam   GEN: No acute distress.   Neck: No  JVD Cardiac: RRR, 3/6 systolic murmur, mid to late peaking, no diastolic murmurs, rubs, or gallops.  Respiratory: Clear  to auscultation bilaterally. GI: Soft, nontender, non-distended  MS: No  edema; No deformity. Neuro:  Nonfocal  Psych: Normal affect   Labs    Chemistry Recent Labs  Lab 09/08/20 1221  09/09/20 0300 09/10/20 0251  NA 134* 135 133*  K 4.4 3.8 3.5  CL 97* 97* 96*  CO2 '29 31 27  '$ GLUCOSE 103* 82 198*  BUN '18 10 8  '$ CREATININE 3.86* 3.14* 2.71*  CALCIUM 10.3 9.2 9.9  GFRNONAA 13* 16* 19*  ANIONGAP '8 7 10     '$ Hematology Recent Labs  Lab 09/08/20 1221 09/09/20 0300 09/09/20 1204 09/10/20 0251  WBC 7.1 5.2  --  4.1  RBC 2.78* 2.27*  --  2.81*  HGB 9.2* 7.5* 8.2* 9.2*  HCT 28.1* 22.8* 24.3* 27.0*  MCV 101.1* 100.4*  --  96.1  MCH 33.1 33.0  --  32.7  MCHC 32.7 32.9  --  34.1  RDW 14.2 14.1  --  15.5  PLT 243 171  --  188    Cardiac EnzymesNo results for input(s): TROPONINI in the last 168 hours. No results for input(s): TROPIPOC in the last 168 hours.   BNPNo results for input(s): BNP, PROBNP in the last 168 hours.   DDimer No results for input(s): DDIMER in the last 168 hours.   Radiology    CT ABDOMEN PELVIS WO CONTRAST  Result Date: 09/09/2020 CLINICAL DATA:  Anemia.  Abdominal pain. EXAM: CT ABDOMEN AND PELVIS WITHOUT CONTRAST TECHNIQUE: Multidetector CT imaging of the abdomen and pelvis  was performed following the standard protocol without IV contrast. COMPARISON:  None FINDINGS: Lower chest: Bilateral pleural effusions with underlying atelectasis. Cardiomegaly. Three-vessel coronary artery disease. Hepatobiliary: Gallbladder wall thickening and/or pericholecystic fluid identified. Liver is unremarkable on unenhanced images. Pancreas: Unremarkable. No pancreatic ductal dilatation or surrounding inflammatory changes. Spleen: Normal in size without focal abnormality. Adrenals/Urinary Tract: Adrenal glands are unremarkable. Bilateral renal cysts are identified. Symmetric perinephric stranding is likely nonacute. No hydronephrosis. No renal stones or suspicious masses are identified. No ureterectasis or ureteral stones. The bladder is decompressed but grossly unremarkable. Stomach/Bowel: The stomach and small bowel are unremarkable. Scattered colonic diverticulosis is identified without diverticulitis. Moderate fecal loading seen throughout the colon. The appendix is normal in appearance. Vascular/Lymphatic: Calcified atherosclerosis is seen in the tortuous nonaneurysmal aorta. No adenopathy. Reproductive: Uterus and bilateral adnexa are unremarkable. Other: Increased attenuation in the subcutaneous fat consistent with volume overload. A small amount of fluid in the left pericolic gutter is likely due to volume overload. No free air. No fluid collection. Musculoskeletal: Pedicle rods and screws are seen at L3 and L4. A disc spacer device is seen at L3-4. Erosive changes are seen at the superior endplate of L3 and the inferior endplate of L2. There is focal kyphosis in this region. There is air in the disc space in this region. The left pedicle screw at L3 extends superior to the eroded superior endplate into the inferior aspect of the disc space. IMPRESSION: 1. Erosion of the superior endplate of L3 and the inferior endplate of L2 with air in the disc space. The left L3 pedicle screw extends into the  inferior aspect of the disc space. The adjacent soft tissues are not well assessed due to streak artifact off the hardware. Discitis/osteomyelitis not excluded at L3-4. 2. Bilateral pleural effusions with underlying atelectasis. Cardiomegaly. Three-vessel coronary artery disease. 3. Gallbladder wall thickening and/or pericholecystic fluid. A right upper quadrant ultrasound could better evaluate the gallbladder. 4. Moderate fecal loading in the colon. 5. Calcified atherosclerosis in the abdominal aorta. 6. Volume overload with increased attenuation in the subcutaneous fat diffusely and a small amount of fluid in the pericolic gutter. These results will  be called to the ordering clinician or representative by the Radiologist Assistant, and communication documented in the PACS or [employer]. Electronically Signed   By: Dorise Bullion III M.D   On: 09/09/2020 14:38   DG Chest 1 View  Result Date: 09/09/2020 CLINICAL DATA:  CHF.  Shortness of breath. EXAM: CHEST  1 VIEW COMPARISON:  September 08, 2020 FINDINGS: No pneumothorax. Stable cardiomegaly. The hila and mediastinum are unchanged. Diffuse interstitial opacities in the lungs are improved in the interval but do persist. Probable small left effusion with underlying atelectasis. No other interval changes or acute abnormalities. IMPRESSION: Stable cardiomegaly. Persistent but improving pulmonary edema. Persistent small left effusion with underlying atelectasis. Electronically Signed   By: Dorise Bullion III M.D   On: 09/09/2020 09:18   DG Chest 2 View  Result Date: 09/08/2020 CLINICAL DATA:  Shortness of breath beginning this morning EXAM: CHEST - 2 VIEW COMPARISON:  08/22/2020 FINDINGS: Lateral view degraded by patient arm position. Cervical spine fixation. Midline trachea. Moderate cardiomegaly. Atherosclerosis in the transverse aorta. No pleural effusion or pneumothorax. Moderate interstitial edema is asymmetric, greater on the right. Felt to be slightly  increased. IMPRESSION: Cardiomegaly with moderate interstitial edema, slightly increased. Aortic Atherosclerosis (ICD10-I70.0). Electronically Signed   By: Abigail Miyamoto M.D.   On: 09/08/2020 13:25   ECHOCARDIOGRAM COMPLETE  Result Date: 09/09/2020    ECHOCARDIOGRAM REPORT   Patient Name:   ISATOU SHEPP Date of Exam: 09/09/2020 Medical Rec #:  [id number]        Height:       62.0 in Accession #:    [id number]       Weight:       152.1 lb Date of Birth:  [birth year]         BSA:          1.702 m Patient Age:    60 years         BP:           161/94 mmHg Patient Gender: F                HR:           82 bpm. Exam Location:  Inpatient Procedure: 2D Echo Indications:    NSTEMI  History:        Patient has prior history of Echocardiogram examinations, most                 recent 07/04/2020. CHF, end stage renal disease and COPD; Risk                 Factors:Sleep Apnea, Hypertension and Diabetes.  Sonographer:    [name] Referring Phys: 5 [place]  1. Left ventricular ejection fraction, by estimation, is 60%. The left ventricle has normal function. The left ventricle are septal wall motion abnormalities described below. There is mild concentric left ventricular hypertrophy with severe hypetrophy of the basal-septal segment. Left ventricular diastolic parameters are indeterminate in the setting of significant mitral annular calcification but suggestive of increase left atrial pressure.  2. Right ventricular systolic function is normal. The right ventricular size is normal. There is normal pulmonary artery systolic pressure.  3. A small pericardial effusion is present. The pericardial effusion is circumferential.  4. The mitral valve is degenerative. Mild mitral valve regurgitation. Severe mitral annular calcification.  5. The aortic valve is tricuspid. There is mild calcification of the aortic valve. There is moderate thickening of the aortic valve.  Aortic valve regurgitation is not  visualized. Mild aortic valve stenosis. Aortic valve mean gradient measures 24.0 mmHg.  Aortic valve Vmax measures 3.26 m/s.  6. Left atrial size was severely dilated. Comparison(s): A prior study was performed on 07/04/20. Prior images reviewed side by side. Septal hypokinesis appears new. FINDINGS  Left Ventricle: Left ventricular ejection fraction, by estimation, is 60%. The left ventricle has normal function. The left ventricle demonstrates regional wall motion abnormalities. The left ventricular internal cavity size was normal in size. There is  mild concentric left ventricular hypertrophy of the basal-septal segment. Left ventricular diastolic parameters are indeterminate.  LV Wall Scoring: The anterior septum, mid inferoseptal segment, and basal inferoseptal segment are hypokinetic. Right Ventricle: The right ventricular size is normal. No increase in right ventricular wall thickness. Right ventricular systolic function is normal. There is normal pulmonary artery systolic pressure. The tricuspid regurgitant velocity is 2.69 m/s, and  with an assumed right atrial pressure of 3 mmHg, the estimated right ventricular systolic pressure is 123XX123 mmHg. Left Atrium: Left atrial size was severely dilated. Right Atrium: Right atrial size was normal in size. Pericardium: A small pericardial effusion is present. The pericardial effusion is circumferential. Mitral Valve: The mitral valve is degenerative in appearance. Severe mitral annular calcification. Mild mitral valve regurgitation. Tricuspid Valve: The tricuspid valve is normal in structure. Tricuspid valve regurgitation is trivial. No evidence of tricuspid stenosis. Aortic Valve: The aortic valve is tricuspid. There is mild calcification of the aortic valve. There is moderate thickening of the aortic valve. Aortic valve regurgitation is not visualized. Mild aortic stenosis is present. Aortic valve mean gradient measures 24.0 mmHg. Aortic valve peak gradient measures  42.5 mmHg. Aortic valve area, by VTI measures 1.06 cm. Pulmonic Valve: The pulmonic valve was normal in structure. Pulmonic valve regurgitation is not visualized. No evidence of pulmonic stenosis. Aorta: The aortic root and ascending aorta are structurally normal, with no evidence of dilitation. IAS/Shunts: The atrial septum is grossly normal.  LEFT VENTRICLE PLAX 2D LVIDd:         4.90 cm  Diastology LVIDs:         3.20 cm  LV e' lateral:   7.18 cm/s LV PW:         1.50 cm  LV E/e' lateral: 14.5 LV IVS:        1.30 cm LVOT diam:     1.70 cm LV SV:         69 LV SV Index:   41 LVOT Area:     2.27 cm  RIGHT VENTRICLE             IVC RV S prime:     14.40 cm/s  IVC diam: 1.30 cm TAPSE (M-mode): 3.0 cm LEFT ATRIUM             Index       RIGHT ATRIUM           Index LA diam:        3.90 cm 2.29 cm/m  RA Area:     16.50 cm LA Vol (A2C):   75.5 ml 44.37 ml/m RA Volume:   43.70 ml  25.68 ml/m LA Vol (A4C):   85.4 ml 50.18 ml/m LA Biplane Vol: 81.8 ml 48.07 ml/m  AORTIC VALVE AV Area (Vmax):    1.04 cm AV Area (Vmean):   1.02 cm AV Area (VTI):     1.06 cm AV Vmax:  326.00 cm/s AV Vmean:          230.000 cm/s AV VTI:            0.654 m AV Peak Grad:      42.5 mmHg AV Mean Grad:      24.0 mmHg LVOT Vmax:         150.00 cm/s LVOT Vmean:        103.000 cm/s LVOT VTI:          0.306 m LVOT/AV VTI ratio: 0.47  AORTA Ao Asc diam: 3.50 cm MITRAL VALVE                TRICUSPID VALVE MV Area (PHT): 3.91 cm     TR Peak grad:   28.9 mmHg MV Decel Time: 194 msec     TR Vmax:        269.00 cm/s MV E velocity: 104.00 cm/s MV A velocity: 143.00 cm/s  SHUNTS MV E/A ratio:  0.73         Systemic VTI:  0.31 m                             Systemic Diam: 1.70 cm Rudean Haskell MD Electronically signed by Rudean Haskell MD Signature Date/Time: 09/09/2020/4:46:48 PM    Final     Cardiac Studies   Echo:  1. Left ventricular ejection fraction, by estimation, is 60%. The left  ventricle has normal function. The  left ventricle are septal wall motion  abnormalities described below. There is mild concentric left ventricular  hypertrophy with severe hypetrophy  of the basal-septal segment. Left ventricular diastolic parameters are  indeterminate in the setting of significant mitral annular calcification  but suggestive of increase left atrial pressure.   2. Right ventricular systolic function is normal. The right ventricular  size is normal. There is normal pulmonary artery systolic pressure.   3. A small pericardial effusion is present. The pericardial effusion is  circumferential.   4. The mitral valve is degenerative. Mild mitral valve regurgitation.  Severe mitral annular calcification.   5. The aortic valve is tricuspid. There is mild calcification of the  aortic valve. There is moderate thickening of the aortic valve. Aortic  valve regurgitation is not visualized. Mild aortic valve stenosis. Aortic  valve mean gradient measures 24.0 mmHg.   Aortic valve Vmax measures 3.26 m/s.   6. Left atrial size was severely dilated.   Patient Profile     60 y.o. 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 Tyrell Antonio  Assessment & Plan    ACUTE ON CHRONIC DIASTOLIC HF:  Net urine intake to output negative 2.1 liters.  This is in addition to dialysis.  Breathing better.  Likely change to PO diuretic.    NSTEMI:  Cath today.  The patient understands  that risks included but are not limited to stroke (1 in 1000), death (1 in 2), kidney failure [usually temporary] (1 in 500), bleeding (  1 in 200), allergic reaction [possibly serious] (1 in 200).  The patient understands and agrees to proceed.   HTN:    BPs are better but still chronically elevated. On IV NTG.  However, likely change to Imdur post cath.  Will titrate meds as needed.      ANEMIA:  Status post transfusion.  She has no active site of bleeding and primary team is checking stool guaiac.  Hgb is improved.  I think she would be OK for DAPT if needed.    MURMUR:  Mild AS.  No change in therapy.   For questions or updates, please contact [facility] Please consult [email or web address] for contact info under Cardiology/STEMI.   Signed, Minus Breeding, MD  09/10/2020, 9:40 AM

## 2020-09-10 NOTE — Progress Notes (Addendum)
[facility] for Heparin Indication: chest pain/ACS  Allergies  Allergen Reactions   3-Methyl-2-Benzothiazolinone Hydrazone Other (See Comments)    Muscle weakness muscle cramping in legs Other reaction(s): Myalgias (Muscle Pain)   Banana Anaphylaxis and Swelling    TONGUE AND MOUTH SWELLING   Black Walnut Flavor Anaphylaxis    Walnuts   Hazelnut ([place]) Allergy Skin Test Anaphylaxis    Hazelnuts   Leflunomide And Related Other (See Comments)    Severe Headache Other reaction(s): Headache Severe Headache Severe Headache     Lisinopril Swelling    Angioedema  Swelling of the face Angioedema  Swelling of the face Other reaction(s): Facial Swelling    No [employer] Food Allergies Anaphylaxis    Spicy Mustard 4.7.2021 Pt reports that she eats Regular Yellow Mustard   Other Other (See Comments), Anaphylaxis and Swelling    Cosentyx= angioedema  Other reaction(s): Facial Edema (intolerance) Mouth itches and tongue swelling Hazel nuts and pecans also Walnuts Walnuts Renal failure Other reaction(s): Other Serotonin Syndrome  Serotonin syndrome  Tremors Other reaction(s): Other Tremors Muscle weakness muscle cramping in legs Other reaction(s): Myalgias (Muscle Pain) Mouth itches and tongue swelling Hazel nuts and pecans also Walnuts    Pecan Extract Allergy Skin Test Anaphylaxis    Pecans Pecans Pecans    Pecan Nut (Diagnostic) Anaphylaxis    Pecans   Trazodone And Nefazodone Other (See Comments)    Serotonin Syndrome  Serotonin syndrome  Serotonin Syndrome  Serotonin syndrome  Tremors Other reaction(s): Other Tremors    Adalimumab Other (See Comments)    Blisters on abdomen =humira Blisters on abdomen =humira Blisters on abdomen =humira   Escitalopram Oxalate Other (See Comments)    Hand problems - Serotonin Syndrome  Hand problems - Serotonin Syndrome  Hand problems - Serotonin Syndrome  Hand problems     Ezetimibe Other (See Comments)    myalgias cramps myalgias cramps    Secukinumab Swelling    Cosentyx = angiodema Cosentyx = angiodema   Statins Other (See Comments)    Leg pains and weakness Leg pains and weakness   Ibuprofen Other (See Comments)    Renal Problems    Patient Measurements: Height: '5\' 2"'$  (157.5 cm) Weight: 63.4 kg (139 lb 12.4 oz) IBW/kg (Calculated) : 50.1 Heparin Dosing Weight: 64.2 kg  Vital Signs: Temp: 98 F (36.7 C) (08/01 0700) Temp Source: Oral (08/01 0700) BP: 175/76 (08/01 0943) Pulse Rate: 85 (08/01 0700)  Labs: Recent Labs    09/08/20 1221 09/08/20 1430 09/08/20 1800 09/09/20 0300 09/09/20 0605 09/09/20 1204 09/10/20 0251 09/10/20 0827  HGB 9.2*  --   --  7.5*  --  8.2* 9.2*  --   HCT 28.1*  --   --  22.8*  --  24.3* 27.0*  --   PLT 243  --   --  171  --   --  188  --   LABPROT  --   --  14.4  --   --   --   --   --   INR  --   --  1.1  --   --   --   --   --   HEPARINUNFRC  --   --   --  0.12*  --  0.21*  --  0.57  CREATININE 3.86*  --   --  3.14*  --   --  2.71*  --   TROPONINIHS 40* 126* 330*  --  1,499*  --   --   --  Estimated Creatinine Clearance: 19.3 mL/min (A) (by C-G formula based on SCr of 2.71 mg/dL (H)).   Medical History: Past Medical History:  Diagnosis Date   Anemia of chronic disease    Anxiety    Arthritis    oa and psoriatic ra   Asthma    Back pain, chronic    lower back   Candida infection finished tx 2 days ago   Chronic insomnia    COPD (chronic obstructive pulmonary disease) (HCC)    Coronary artery calcification seen on CT scan    Diabetes mellitus    type 2   Eczema    ESRD on dialysis [facility])    Essential hypertension    GERD (gastroesophageal reflux disease)    Gout    History of hiatal hernia    Hypoalbuminemia    Hyponatremia    Mild aortic stenosis    Mitral regurgitation    Mitral stenosis    Obesity    PAD (peripheral artery disease) ([place])    a. externial iliac  calcification seen on CT 04/2020.   Pneumonia few yrs ago x 2   Sleep apnea    Tobacco abuse    Upper GI bleed 05/2019    Infusions:   sodium chloride     heparin 1,300 Units/hr (09/09/20 1517)   nitroGLYCERIN 45 mcg/min (09/10/20 0541)   Assessment: 60 yo female presenting with SOB. Patient is ESRD on HD MWF, received emergent session 7/30. No PTA anticoagulation. Pharmacy consulted to dose heparin for elevated troponin [id number]).   Heparin level came back therapeutic this AM. Hgb stable at 9.2. Possible cath today. We will get a confirm level.   Addendum Pt is s/p cath with 2VCAD. CVTS consult for CABG. Resume heparin 8 hr post sheath removal (~1430)  Goal of Therapy:  Heparin level 0.3-0.7 units/ml Monitor platelets by anticoagulation protocol: Yes   Plan:  Resume heparin 1300 units/hr at 2230 tonight Heparin level in AM Monitor H/H, signs/symptoms of bleeding  Onnie Boer, PharmD, BCIDP, AAHIVP, CPP Infectious Disease Pharmacist 09/10/2020 9:54 AM

## 2020-09-10 NOTE — Plan of Care (Signed)
  Problem: Elimination: °Goal: Will not experience complications related to urinary retention °Outcome: Completed/Met °  °Problem: Coping: °Goal: Level of anxiety will decrease °Outcome: Completed/Met °  °Problem: Nutrition: °Goal: Adequate nutrition will be maintained °Outcome: Completed/Met °  °

## 2020-09-10 NOTE — Progress Notes (Signed)
Pt returned from dialysis. Pt refused to wear bipap for the rest of the night.

## 2020-09-10 NOTE — Interval H&P Note (Signed)
History and Physical Interval Note:  09/10/2020 1:28 PM  [name]  has presented today for surgery, with the diagnosis of NSTEMI.  The various methods of treatment have been discussed with the patient and family. After consideration of risks, benefits and other options for treatment, the patient has consented to  Procedure(s): LEFT HEART CATH AND CORONARY ANGIOGRAPHY (N/A) as a surgical intervention.  The patient's history has been reviewed, patient examined, no change in status, stable for surgery.  I have reviewed the patient's chart and labs.  Questions were answered to the patient's satisfaction.    Cath Lab Visit (complete for each Cath Lab visit)  Clinical Evaluation Leading to the Procedure:   ACS: Yes.    Non-ACS:  N/A  Itsel Opfer

## 2020-09-10 NOTE — Progress Notes (Signed)
   Consulted CT surgery for possible CABG at the request of Dr. Saunders Revel.  Darreld Mclean, PA-C 09/10/2020 2:55 PM

## 2020-09-10 NOTE — H&P (View-Only) (Signed)
Progress Note  Patient Name: Jodi Bell Date of Encounter: 09/10/2020  Primary Cardiologist:   None   Subjective   Some chest discomfort this morning.  No acute SOB.   Inpatient Medications    Scheduled Meds:  sodium chloride   Intravenous Once   allopurinol  100 mg Oral QPM   amLODipine  10 mg Oral Daily   aspirin EC  81 mg Oral Daily   calcium acetate  2,001 mg Oral TID WC   chlorhexidine  15 mL Mouth Rinse BID   Chlorhexidine Gluconate Cloth  6 each Topical Q0600   cholecalciferol  1,000 Units Oral QPM   [START ON 09/11/2020] cinacalcet  30 mg Oral Q T,Th,Sa-HD   cloNIDine  0.3 mg Oral TID   ferrous sulfate  325 mg Oral Once per day on Mon Wed Fri   fluticasone  1 spray Each Nare Daily   folic acid  1 mg Oral Daily   furosemide  80 mg Intravenous BID   hydrALAZINE  100 mg Oral Q8H   insulin aspart  0-6 Units Subcutaneous TID WC   ipratropium-albuterol  3 mL Nebulization BID   irbesartan  300 mg Oral Daily   labetalol  400 mg Oral TID   linagliptin  5 mg Oral Daily   loratadine  10 mg Oral Daily   mouth rinse  15 mL Mouth Rinse q12n4p   montelukast  10 mg Oral Q supper   nicotine  7 mg Transdermal Daily   pantoprazole  40 mg Oral Daily   polyethylene glycol  17 g Oral Daily   polyvinyl alcohol  1 drop Both Eyes TID AC & HS   predniSONE  20 mg Oral Q breakfast   saccharomyces boulardii  250 mg Oral Daily   sodium chloride flush  3 mL Intravenous Q12H   Tofacitinib Citrate  5 mg Oral QHS   umeclidinium bromide  1 puff Inhalation Daily   zolpidem  5 mg Oral QHS   Continuous Infusions:  sodium chloride     heparin 1,300 Units/hr (09/09/20 1517)   nitroGLYCERIN 45 mcg/min (09/10/20 0541)   PRN Meds: sodium chloride, acetaminophen, albuterol, cyclobenzaprine, gabapentin, LORazepam, ondansetron (ZOFRAN) IV, oxyCODONE, sodium chloride flush   Vital Signs    Vitals:   09/10/20 0405 09/10/20 0500 09/10/20 0600 09/10/20 0700  BP:  (!) 173/99 (!) 165/81 (!)  155/81  Pulse:  82 85 85  Resp: '16 11 14 11  '$ Temp:  98.2 F (36.8 C)  98 F (36.7 C)  TempSrc:    Oral  SpO2:  100% 100% 100%  Weight:  63.4 kg    Height:        Intake/Output Summary (Last 24 hours) at 09/10/2020 0940 Last data filed at 09/10/2020 0805 Gross per 24 hour  Intake 1351.53 ml  Output 2000 ml  Net -648.47 ml   Filed Weights   09/08/20 1145 09/09/20 0400 09/10/20 0500  Weight: 68 kg 69 kg 63.4 kg    Telemetry    NSR - Personally Reviewed  ECG    NA - Personally Reviewed  Physical Exam   GEN: No acute distress.   Neck: No  JVD Cardiac: RRR, 3/6 systolic murmur, mid to late peaking, no diastolic murmurs, rubs, or gallops.  Respiratory: Clear  to auscultation bilaterally. GI: Soft, nontender, non-distended  MS: No  edema; No deformity. Neuro:  Nonfocal  Psych: Normal affect   Labs    Chemistry Recent Labs  Lab 09/08/20 1221  09/09/20 0300 09/10/20 0251  NA 134* 135 133*  K 4.4 3.8 3.5  CL 97* 97* 96*  CO2 '29 31 27  '$ GLUCOSE 103* 82 198*  BUN '18 10 8  '$ CREATININE 3.86* 3.14* 2.71*  CALCIUM 10.3 9.2 9.9  GFRNONAA 13* 16* 19*  ANIONGAP '8 7 10     '$ Hematology Recent Labs  Lab 09/08/20 1221 09/09/20 0300 09/09/20 1204 09/10/20 0251  WBC 7.1 5.2  --  4.1  RBC 2.78* 2.27*  --  2.81*  HGB 9.2* 7.5* 8.2* 9.2*  HCT 28.1* 22.8* 24.3* 27.0*  MCV 101.1* 100.4*  --  96.1  MCH 33.1 33.0  --  32.7  MCHC 32.7 32.9  --  34.1  RDW 14.2 14.1  --  15.5  PLT 243 171  --  188    Cardiac EnzymesNo results for input(s): TROPONINI in the last 168 hours. No results for input(s): TROPIPOC in the last 168 hours.   BNPNo results for input(s): BNP, PROBNP in the last 168 hours.   DDimer No results for input(s): DDIMER in the last 168 hours.   Radiology    CT ABDOMEN PELVIS WO CONTRAST  Result Date: 09/09/2020 CLINICAL DATA:  Anemia.  Abdominal pain. EXAM: CT ABDOMEN AND PELVIS WITHOUT CONTRAST TECHNIQUE: Multidetector CT imaging of the abdomen and pelvis  was performed following the standard protocol without IV contrast. COMPARISON:  None FINDINGS: Lower chest: Bilateral pleural effusions with underlying atelectasis. Cardiomegaly. Three-vessel coronary artery disease. Hepatobiliary: Gallbladder wall thickening and/or pericholecystic fluid identified. Liver is unremarkable on unenhanced images. Pancreas: Unremarkable. No pancreatic ductal dilatation or surrounding inflammatory changes. Spleen: Normal in size without focal abnormality. Adrenals/Urinary Tract: Adrenal glands are unremarkable. Bilateral renal cysts are identified. Symmetric perinephric stranding is likely nonacute. No hydronephrosis. No renal stones or suspicious masses are identified. No ureterectasis or ureteral stones. The bladder is decompressed but grossly unremarkable. Stomach/Bowel: The stomach and small bowel are unremarkable. Scattered colonic diverticulosis is identified without diverticulitis. Moderate fecal loading seen throughout the colon. The appendix is normal in appearance. Vascular/Lymphatic: Calcified atherosclerosis is seen in the tortuous nonaneurysmal aorta. No adenopathy. Reproductive: Uterus and bilateral adnexa are unremarkable. Other: Increased attenuation in the subcutaneous fat consistent with volume overload. A small amount of fluid in the left pericolic gutter is likely due to volume overload. No free air. No fluid collection. Musculoskeletal: Pedicle rods and screws are seen at L3 and L4. A disc spacer device is seen at L3-4. Erosive changes are seen at the superior endplate of L3 and the inferior endplate of L2. There is focal kyphosis in this region. There is air in the disc space in this region. The left pedicle screw at L3 extends superior to the eroded superior endplate into the inferior aspect of the disc space. IMPRESSION: 1. Erosion of the superior endplate of L3 and the inferior endplate of L2 with air in the disc space. The left L3 pedicle screw extends into the  inferior aspect of the disc space. The adjacent soft tissues are not well assessed due to streak artifact off the hardware. Discitis/osteomyelitis not excluded at L3-4. 2. Bilateral pleural effusions with underlying atelectasis. Cardiomegaly. Three-vessel coronary artery disease. 3. Gallbladder wall thickening and/or pericholecystic fluid. A right upper quadrant ultrasound could better evaluate the gallbladder. 4. Moderate fecal loading in the colon. 5. Calcified atherosclerosis in the abdominal aorta. 6. Volume overload with increased attenuation in the subcutaneous fat diffusely and a small amount of fluid in the pericolic gutter. These results will  be called to the ordering clinician or representative by the Radiologist Assistant, and communication documented in the PACS or [employer]. Electronically Signed   By: Dorise Bullion III M.D   On: 09/09/2020 14:38   DG Chest 1 View  Result Date: 09/09/2020 CLINICAL DATA:  CHF.  Shortness of breath. EXAM: CHEST  1 VIEW COMPARISON:  September 08, 2020 FINDINGS: No pneumothorax. Stable cardiomegaly. The hila and mediastinum are unchanged. Diffuse interstitial opacities in the lungs are improved in the interval but do persist. Probable small left effusion with underlying atelectasis. No other interval changes or acute abnormalities. IMPRESSION: Stable cardiomegaly. Persistent but improving pulmonary edema. Persistent small left effusion with underlying atelectasis. Electronically Signed   By: Dorise Bullion III M.D   On: 09/09/2020 09:18   DG Chest 2 View  Result Date: 09/08/2020 CLINICAL DATA:  Shortness of breath beginning this morning EXAM: CHEST - 2 VIEW COMPARISON:  08/22/2020 FINDINGS: Lateral view degraded by patient arm position. Cervical spine fixation. Midline trachea. Moderate cardiomegaly. Atherosclerosis in the transverse aorta. No pleural effusion or pneumothorax. Moderate interstitial edema is asymmetric, greater on the right. Felt to be slightly  increased. IMPRESSION: Cardiomegaly with moderate interstitial edema, slightly increased. Aortic Atherosclerosis (ICD10-I70.0). Electronically Signed   By: Abigail Miyamoto M.D.   On: 09/08/2020 13:25   ECHOCARDIOGRAM COMPLETE  Result Date: 09/09/2020    ECHOCARDIOGRAM REPORT   Patient Name:   Jodi Bell Date of Exam: 09/09/2020 Medical Rec #:  [id number]        Height:       62.0 in Accession #:    [id number]       Weight:       152.1 lb Date of Birth:  [birth year]         BSA:          1.702 m Patient Age:    15 years         BP:           161/94 mmHg Patient Gender: F                HR:           82 bpm. Exam Location:  Inpatient Procedure: 2D Echo Indications:    NSTEMI  History:        Patient has prior history of Echocardiogram examinations, most                 recent 07/04/2020. CHF, end stage renal disease and COPD; Risk                 Factors:Sleep Apnea, Hypertension and Diabetes.  Sonographer:    [name] Referring Phys: [id number] [place]  1. Left ventricular ejection fraction, by estimation, is 60%. The left ventricle has normal function. The left ventricle are septal wall motion abnormalities described below. There is mild concentric left ventricular hypertrophy with severe hypetrophy of the basal-septal segment. Left ventricular diastolic parameters are indeterminate in the setting of significant mitral annular calcification but suggestive of increase left atrial pressure.  2. Right ventricular systolic function is normal. The right ventricular size is normal. There is normal pulmonary artery systolic pressure.  3. A small pericardial effusion is present. The pericardial effusion is circumferential.  4. The mitral valve is degenerative. Mild mitral valve regurgitation. Severe mitral annular calcification.  5. The aortic valve is tricuspid. There is mild calcification of the aortic valve. There is moderate thickening of the aortic valve.  Aortic valve regurgitation is not  visualized. Mild aortic valve stenosis. Aortic valve mean gradient measures 24.0 mmHg.  Aortic valve Vmax measures 3.26 m/s.  6. Left atrial size was severely dilated. Comparison(s): A prior study was performed on 07/04/20. Prior images reviewed side by side. Septal hypokinesis appears new. FINDINGS  Left Ventricle: Left ventricular ejection fraction, by estimation, is 60%. The left ventricle has normal function. The left ventricle demonstrates regional wall motion abnormalities. The left ventricular internal cavity size was normal in size. There is  mild concentric left ventricular hypertrophy of the basal-septal segment. Left ventricular diastolic parameters are indeterminate.  LV Wall Scoring: The anterior septum, mid inferoseptal segment, and basal inferoseptal segment are hypokinetic. Right Ventricle: The right ventricular size is normal. No increase in right ventricular wall thickness. Right ventricular systolic function is normal. There is normal pulmonary artery systolic pressure. The tricuspid regurgitant velocity is 2.69 m/s, and  with an assumed right atrial pressure of 3 mmHg, the estimated right ventricular systolic pressure is 123XX123 mmHg. Left Atrium: Left atrial size was severely dilated. Right Atrium: Right atrial size was normal in size. Pericardium: A small pericardial effusion is present. The pericardial effusion is circumferential. Mitral Valve: The mitral valve is degenerative in appearance. Severe mitral annular calcification. Mild mitral valve regurgitation. Tricuspid Valve: The tricuspid valve is normal in structure. Tricuspid valve regurgitation is trivial. No evidence of tricuspid stenosis. Aortic Valve: The aortic valve is tricuspid. There is mild calcification of the aortic valve. There is moderate thickening of the aortic valve. Aortic valve regurgitation is not visualized. Mild aortic stenosis is present. Aortic valve mean gradient measures 24.0 mmHg. Aortic valve peak gradient measures  42.5 mmHg. Aortic valve area, by VTI measures 1.06 cm. Pulmonic Valve: The pulmonic valve was normal in structure. Pulmonic valve regurgitation is not visualized. No evidence of pulmonic stenosis. Aorta: The aortic root and ascending aorta are structurally normal, with no evidence of dilitation. IAS/Shunts: The atrial septum is grossly normal.  LEFT VENTRICLE PLAX 2D LVIDd:         4.90 cm  Diastology LVIDs:         3.20 cm  LV e' lateral:   7.18 cm/s LV PW:         1.50 cm  LV E/e' lateral: 14.5 LV IVS:        1.30 cm LVOT diam:     1.70 cm LV SV:         69 LV SV Index:   41 LVOT Area:     2.27 cm  RIGHT VENTRICLE             IVC RV S prime:     14.40 cm/s  IVC diam: 1.30 cm TAPSE (M-mode): 3.0 cm LEFT ATRIUM             Index       RIGHT ATRIUM           Index LA diam:        3.90 cm 2.29 cm/m  RA Area:     16.50 cm LA Vol (A2C):   75.5 ml 44.37 ml/m RA Volume:   43.70 ml  25.68 ml/m LA Vol (A4C):   85.4 ml 50.18 ml/m LA Biplane Vol: 81.8 ml 48.07 ml/m  AORTIC VALVE AV Area (Vmax):    1.04 cm AV Area (Vmean):   1.02 cm AV Area (VTI):     1.06 cm AV Vmax:  326.00 cm/s AV Vmean:          230.000 cm/s AV VTI:            0.654 m AV Peak Grad:      42.5 mmHg AV Mean Grad:      24.0 mmHg LVOT Vmax:         150.00 cm/s LVOT Vmean:        103.000 cm/s LVOT VTI:          0.306 m LVOT/AV VTI ratio: 0.47  AORTA Ao Asc diam: 3.50 cm MITRAL VALVE                TRICUSPID VALVE MV Area (PHT): 3.91 cm     TR Peak grad:   28.9 mmHg MV Decel Time: 194 msec     TR Vmax:        269.00 cm/s MV E velocity: 104.00 cm/s MV A velocity: 143.00 cm/s  SHUNTS MV E/A ratio:  0.73         Systemic VTI:  0.31 m                             Systemic Diam: 1.70 cm Jodi Haskell MD Electronically signed by Jodi Haskell MD Signature Date/Time: 09/09/2020/4:46:48 PM    Final     Cardiac Studies   Echo:  1. Left ventricular ejection fraction, by estimation, is 60%. The left  ventricle has normal function. The  left ventricle are septal wall motion  abnormalities described below. There is mild concentric left ventricular  hypertrophy with severe hypetrophy  of the basal-septal segment. Left ventricular diastolic parameters are  indeterminate in the setting of significant mitral annular calcification  but suggestive of increase left atrial pressure.   2. Right ventricular systolic function is normal. The right ventricular  size is normal. There is normal pulmonary artery systolic pressure.   3. A small pericardial effusion is present. The pericardial effusion is  circumferential.   4. The mitral valve is degenerative. Mild mitral valve regurgitation.  Severe mitral annular calcification.   5. The aortic valve is tricuspid. There is mild calcification of the  aortic valve. There is moderate thickening of the aortic valve. Aortic  valve regurgitation is not visualized. Mild aortic valve stenosis. Aortic  valve mean gradient measures 24.0 mmHg.   Aortic valve Vmax measures 3.26 m/s.   6. Left atrial size was severely dilated.   Patient Profile     60 y.o. 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 Tyrell Antonio  Assessment & Plan    ACUTE ON CHRONIC DIASTOLIC HF:  Net urine intake to output negative 2.1 liters.  This is in addition to dialysis.  Breathing better.  Likely change to PO diuretic.    NSTEMI:  Cath today.  The patient understands  that risks included but are not limited to stroke (1 in 1000), death (1 in 16), kidney failure [usually temporary] (1 in 500), bleeding (  1 in 200), allergic reaction [possibly serious] (1 in 200).  The patient understands and agrees to proceed.   HTN:    BPs are better but still chronically elevated. On IV NTG.  However, likely change to Imdur post cath.  Will titrate meds as needed.      ANEMIA:  Status post transfusion.  She has no active site of bleeding and primary team is checking stool guaiac.  Hgb is improved.  I think she would be OK for DAPT if needed.    MURMUR:  Mild AS.  No change in therapy.   For questions or updates, please contact [facility] Please consult [email or web address] for contact info under Cardiology/STEMI.   Signed, Minus Breeding, MD  09/10/2020, 9:40 AM

## 2020-09-11 ENCOUNTER — Inpatient Hospital Stay (HOSPITAL_COMMUNITY): Payer: Medicare PPO

## 2020-09-11 DIAGNOSIS — Z01810 Encounter for preprocedural cardiovascular examination: Secondary | ICD-10-CM | POA: Diagnosis not present

## 2020-09-11 DIAGNOSIS — I160 Hypertensive urgency: Secondary | ICD-10-CM | POA: Diagnosis not present

## 2020-09-11 DIAGNOSIS — I214 Non-ST elevation (NSTEMI) myocardial infarction: Secondary | ICD-10-CM | POA: Diagnosis not present

## 2020-09-11 DIAGNOSIS — J810 Acute pulmonary edema: Secondary | ICD-10-CM | POA: Diagnosis not present

## 2020-09-11 LAB — BASIC METABOLIC PANEL
Anion gap: 11 (ref 5–15)
BUN: 19 mg/dL (ref 6–20)
CO2: 28 mmol/L (ref 22–32)
Calcium: 10.5 mg/dL — ABNORMAL HIGH (ref 8.9–10.3)
Chloride: 93 mmol/L — ABNORMAL LOW (ref 98–111)
Creatinine, Ser: 4.36 mg/dL — ABNORMAL HIGH (ref 0.44–1.00)
GFR, Estimated: 11 mL/min — ABNORMAL LOW (ref >60)
Glucose, Bld: 120 mg/dL — ABNORMAL HIGH (ref 70–99)
Potassium: 4.1 mmol/L (ref 3.5–5.1)
Sodium: 132 mmol/L — ABNORMAL LOW (ref 135–145)

## 2020-09-11 LAB — CBC
HCT: 23.7 % — ABNORMAL LOW (ref 36.0–46.0)
Hemoglobin: 7.8 g/dL — ABNORMAL LOW (ref 12.0–15.0)
MCH: 32.1 pg (ref 26.0–34.0)
MCHC: 32.9 g/dL (ref 30.0–36.0)
MCV: 97.5 fL (ref 80.0–100.0)
Platelets: 175 10*3/uL (ref 150–400)
RBC: 2.43 MIL/uL — ABNORMAL LOW (ref 3.87–5.11)
RDW: 15.5 % (ref 11.5–15.5)
WBC: 5.9 10*3/uL (ref 4.0–10.5)
nRBC: 0 % (ref 0.0–0.2)

## 2020-09-11 LAB — HEPARIN LEVEL (UNFRACTIONATED): Heparin Unfractionated: 0.21 IU/mL — ABNORMAL LOW (ref 0.30–0.70)

## 2020-09-11 LAB — OCCULT BLOOD X 1 CARD TO LAB, STOOL: Fecal Occult Bld: POSITIVE — AB

## 2020-09-11 LAB — GLUCOSE, CAPILLARY
Glucose-Capillary: 110 mg/dL — ABNORMAL HIGH (ref 70–99)
Glucose-Capillary: 111 mg/dL — ABNORMAL HIGH (ref 70–99)
Glucose-Capillary: 160 mg/dL — ABNORMAL HIGH (ref 70–99)
Glucose-Capillary: 124 mg/dL — ABNORMAL HIGH (ref 70–99)

## 2020-09-11 LAB — PREPARE RBC (CROSSMATCH)

## 2020-09-11 MED ORDER — LACTULOSE 10 GM/15ML PO SOLN
30.0000 g | Freq: Once | ORAL | Status: AC
  Administered 2020-09-11: 30 g via ORAL
  Filled 2020-09-11: qty 45

## 2020-09-11 MED ORDER — PREDNISONE 5 MG PO TABS
5.0000 mg | ORAL_TABLET | Freq: Every day | ORAL | Status: DC
  Administered 2020-09-12 – 2020-10-04 (×20): 5 mg via ORAL
  Filled 2020-09-11 (×21): qty 1

## 2020-09-11 MED ORDER — SODIUM CHLORIDE 0.9% IV SOLUTION: Freq: Once | INTRAVENOUS | Status: AC

## 2020-09-11 NOTE — Progress Notes (Signed)
Pre cabg has been completed.   Preliminary results in CV Proc.   Abram Sander 09/11/2020 2:57 PM

## 2020-09-11 NOTE — Progress Notes (Signed)
Progress Note  Patient Name: Jodi Bell Date of Encounter: 09/11/2020  Primary Cardiologist:   None   Subjective   No chest pain.  No SOB.   Inpatient Medications    Scheduled Meds:  sodium chloride   Intravenous Once   allopurinol  100 mg Oral QPM   amLODipine  10 mg Oral Daily   aspirin EC  81 mg Oral Daily   calcium acetate  2,001 mg Oral TID WC   chlorhexidine  15 mL Mouth Rinse BID   Chlorhexidine Gluconate Cloth  6 each Topical Q0600   cholecalciferol  1,000 Units Oral QPM   cinacalcet  30 mg Oral Q T,Th,Sa-HD   cloNIDine  0.3 mg Oral TID   ferrous sulfate  325 mg Oral Once per day on Mon Wed Fri   fluticasone  1 spray Each Nare Daily   folic acid  1 mg Oral Daily   furosemide  80 mg Oral BID   hydrALAZINE  100 mg Oral Q8H   insulin aspart  0-6 Units Subcutaneous TID WC   irbesartan  300 mg Oral Daily   labetalol  400 mg Oral TID   linagliptin  5 mg Oral Daily   loratadine  10 mg Oral Daily   mouth rinse  15 mL Mouth Rinse q12n4p   montelukast  10 mg Oral Q supper   nicotine  7 mg Transdermal Daily   pantoprazole  40 mg Oral Daily   polyethylene glycol  17 g Oral Daily   polyvinyl alcohol  1 drop Both Eyes TID AC & HS   predniSONE  10 mg Oral Q breakfast   saccharomyces boulardii  250 mg Oral Daily   sodium chloride flush  3 mL Intravenous Q12H   sorbitol  30 mL Oral Once   Tofacitinib Citrate  5 mg Oral QHS   umeclidinium bromide  1 puff Inhalation Daily   zolpidem  5 mg Oral QHS   Continuous Infusions:  sodium chloride     sodium chloride     heparin 1,400 Units/hr (09/11/20 0516)   nitroGLYCERIN 75 mcg/min (09/11/20 0725)   PRN Meds: sodium chloride, sodium chloride, acetaminophen, albuterol, cyclobenzaprine, gabapentin, LORazepam, ondansetron (ZOFRAN) IV, oxyCODONE, sodium chloride flush   Vital Signs    Vitals:   09/11/20 0650 09/11/20 0700 09/11/20 0710 09/11/20 0810  BP:  (!) 158/73  (!) 156/78  Pulse: 73 73 73   Resp: '14 17 15 16   '$ Temp:  98.4 F (36.9 C)    TempSrc:  Oral    SpO2: 100% 99% 100%   Weight:      Height:        Intake/Output Summary (Last 24 hours) at 09/11/2020 1013 Last data filed at 09/11/2020 0912 Gross per 24 hour  Intake 557.26 ml  Output --  Net 557.26 ml   Filed Weights   09/10/20 1238 09/11/20 0046 09/11/20 0500  Weight: 63.4 kg 64 kg 64 kg    Telemetry    NSR - Personally Reviewed  ECG    NA - Personally Reviewed  Physical Exam   GENERAL:  Well appearing NECK:  No jugular venous distention, waveform within normal limits, carotid upstroke brisk and symmetric, no bruits, no thyromegaly LUNGS:  Clear to auscultation bilaterally CHEST:  Unremarkable HEART:  PMI not displaced or sustained,S1 and S2 within normal limits, no S3, no S4, no clicks, no rubs, 3/6 apical systolic murmur, no diastolic murmurs ABD:  Flat, positive bowel sounds normal in  frequency in pitch, no bruits, no rebound, no guarding, no midline pulsatile mass, no hepatomegaly, no splenomegaly EXT:  2 plus pulses throughout, no edema, no cyanosis no clubbing, right groin with slight echymosis but no bleeding .    Labs    Chemistry Recent Labs  Lab 09/09/20 0300 09/10/20 0251 09/11/20 0358  NA 135 133* 132*  K 3.8 3.5 4.1  CL 97* 96* 93*  CO2 '31 27 28  '$ GLUCOSE 82 198* 120*  BUN '10 8 19  '$ CREATININE 3.14* 2.71* 4.36*  CALCIUM 9.2 9.9 10.5*  GFRNONAA 16* 19* 11*  ANIONGAP '7 10 11     '$ Hematology Recent Labs  Lab 09/09/20 0300 09/09/20 1204 09/10/20 0251 09/11/20 0358  WBC 5.2  --  4.1 5.9  RBC 2.27*  --  2.81* 2.43*  HGB 7.5* 8.2* 9.2* 7.8*  HCT 22.8* 24.3* 27.0* 23.7*  MCV 100.4*  --  96.1 97.5  MCH 33.0  --  32.7 32.1  MCHC 32.9  --  34.1 32.9  RDW 14.1  --  15.5 15.5  PLT 171  --  188 175    Cardiac EnzymesNo results for input(s): TROPONINI in the last 168 hours. No results for input(s): TROPIPOC in the last 168 hours.   BNPNo results for input(s): BNP, PROBNP in the last 168 hours.    DDimer No results for input(s): DDIMER in the last 168 hours.   Radiology    CT ABDOMEN PELVIS WO CONTRAST  Result Date: 09/09/2020 CLINICAL DATA:  Anemia.  Abdominal pain. EXAM: CT ABDOMEN AND PELVIS WITHOUT CONTRAST TECHNIQUE: Multidetector CT imaging of the abdomen and pelvis was performed following the standard protocol without IV contrast. COMPARISON:  None FINDINGS: Lower chest: Bilateral pleural effusions with underlying atelectasis. Cardiomegaly. Three-vessel coronary artery disease. Hepatobiliary: Gallbladder wall thickening and/or pericholecystic fluid identified. Liver is unremarkable on unenhanced images. Pancreas: Unremarkable. No pancreatic ductal dilatation or surrounding inflammatory changes. Spleen: Normal in size without focal abnormality. Adrenals/Urinary Tract: Adrenal glands are unremarkable. Bilateral renal cysts are identified. Symmetric perinephric stranding is likely nonacute. No hydronephrosis. No renal stones or suspicious masses are identified. No ureterectasis or ureteral stones. The bladder is decompressed but grossly unremarkable. Stomach/Bowel: The stomach and small bowel are unremarkable. Scattered colonic diverticulosis is identified without diverticulitis. Moderate fecal loading seen throughout the colon. The appendix is normal in appearance. Vascular/Lymphatic: Calcified atherosclerosis is seen in the tortuous nonaneurysmal aorta. No adenopathy. Reproductive: Uterus and bilateral adnexa are unremarkable. Other: Increased attenuation in the subcutaneous fat consistent with volume overload. A small amount of fluid in the left pericolic gutter is likely due to volume overload. No free air. No fluid collection. Musculoskeletal: Pedicle rods and screws are seen at L3 and L4. A disc spacer device is seen at L3-4. Erosive changes are seen at the superior endplate of L3 and the inferior endplate of L2. There is focal kyphosis in this region. There is air in the disc space in  this region. The left pedicle screw at L3 extends superior to the eroded superior endplate into the inferior aspect of the disc space. IMPRESSION: 1. Erosion of the superior endplate of L3 and the inferior endplate of L2 with air in the disc space. The left L3 pedicle screw extends into the inferior aspect of the disc space. The adjacent soft tissues are not well assessed due to streak artifact off the hardware. Discitis/osteomyelitis not excluded at L3-4. 2. Bilateral pleural effusions with underlying atelectasis. Cardiomegaly. Three-vessel coronary artery disease. 3. Gallbladder wall  thickening and/or pericholecystic fluid. A right upper quadrant ultrasound could better evaluate the gallbladder. 4. Moderate fecal loading in the colon. 5. Calcified atherosclerosis in the abdominal aorta. 6. Volume overload with increased attenuation in the subcutaneous fat diffusely and a small amount of fluid in the pericolic gutter. These results will be called to the ordering clinician or representative by the Radiologist Assistant, and communication documented in the PACS or [employer]. Electronically Signed   By: Dorise Bullion III M.D   On: 09/09/2020 14:38   CARDIAC CATHETERIZATION  Result Date: 09/10/2020 Conclusions: Severe two-vessel coronary artery disease with heavily calcified disease of up to 80% involving the proximal and mid LAD as well as up to 90% involving the mid RCA. Mild-moderate, non-obstructive disease noted in the LCx territory. Mildly elevated left ventricular filling pressure (LVEDP 15-20 mmHg). Recommendations: Given severe two-vessel CAD with heavy, eccentric calcium (including in the proximal LAD) and the patient's history of diabetes, recommend cardiac surgery consultation for CABG. Improve blood pressure control and wean IV nitroglycerin, as tolerated. Nelva Bush, MD [facility]  ECHOCARDIOGRAM COMPLETE  Result Date: 09/09/2020    ECHOCARDIOGRAM REPORT   Patient Name:   ZAKYAH TEPEDINO Date of Exam: 09/09/2020 Medical Rec #:  [id number]        Height:       62.0 in Accession #:    [id number]       Weight:       152.1 lb Date of Birth:  05-Aug-1960         BSA:          1.702 m Patient Age:    39 years         BP:           161/94 mmHg Patient Gender: F                HR:           82 bpm. Exam Location:  Inpatient Procedure: 2D Echo Indications:    NSTEMI  History:        Patient has prior history of Echocardiogram examinations, most                 recent 07/04/2020. CHF, end stage renal disease and COPD; Risk                 Factors:Sleep Apnea, Hypertension and Diabetes.  Sonographer:    [name] Referring Phys: [id number] [place]  1. Left ventricular ejection fraction, by estimation, is 60%. The left ventricle has normal function. The left ventricle are septal wall motion abnormalities described below. There is mild concentric left ventricular hypertrophy with severe hypetrophy of the basal-septal segment. Left ventricular diastolic parameters are indeterminate in the setting of significant mitral annular calcification but suggestive of increase left atrial pressure.  2. Right ventricular systolic function is normal. The right ventricular size is normal. There is normal pulmonary artery systolic pressure.  3. A small pericardial effusion is present. The pericardial effusion is circumferential.  4. The mitral valve is degenerative. Mild mitral valve regurgitation. Severe mitral annular calcification.  5. The aortic valve is tricuspid. There is mild calcification of the aortic valve. There is moderate thickening of the aortic valve. Aortic valve regurgitation is not visualized. Mild aortic valve stenosis. Aortic valve mean gradient measures 24.0 mmHg.  Aortic valve Vmax measures 3.26 m/s.  6. Left atrial size was severely dilated. Comparison(s): A prior study was performed on 07/04/20. Prior  images reviewed side by side. Septal hypokinesis appears new. FINDINGS  Left  Ventricle: Left ventricular ejection fraction, by estimation, is 60%. The left ventricle has normal function. The left ventricle demonstrates regional wall motion abnormalities. The left ventricular internal cavity size was normal in size. There is  mild concentric left ventricular hypertrophy of the basal-septal segment. Left ventricular diastolic parameters are indeterminate.  LV Wall Scoring: The anterior septum, mid inferoseptal segment, and basal inferoseptal segment are hypokinetic. Right Ventricle: The right ventricular size is normal. No increase in right ventricular wall thickness. Right ventricular systolic function is normal. There is normal pulmonary artery systolic pressure. The tricuspid regurgitant velocity is 2.69 m/s, and  with an assumed right atrial pressure of 3 mmHg, the estimated right ventricular systolic pressure is 123XX123 mmHg. Left Atrium: Left atrial size was severely dilated. Right Atrium: Right atrial size was normal in size. Pericardium: A small pericardial effusion is present. The pericardial effusion is circumferential. Mitral Valve: The mitral valve is degenerative in appearance. Severe mitral annular calcification. Mild mitral valve regurgitation. Tricuspid Valve: The tricuspid valve is normal in structure. Tricuspid valve regurgitation is trivial. No evidence of tricuspid stenosis. Aortic Valve: The aortic valve is tricuspid. There is mild calcification of the aortic valve. There is moderate thickening of the aortic valve. Aortic valve regurgitation is not visualized. Mild aortic stenosis is present. Aortic valve mean gradient measures 24.0 mmHg. Aortic valve peak gradient measures 42.5 mmHg. Aortic valve area, by VTI measures 1.06 cm. Pulmonic Valve: The pulmonic valve was normal in structure. Pulmonic valve regurgitation is not visualized. No evidence of pulmonic stenosis. Aorta: The aortic root and ascending aorta are structurally normal, with no evidence of dilitation.  IAS/Shunts: The atrial septum is grossly normal.  LEFT VENTRICLE PLAX 2D LVIDd:         4.90 cm  Diastology LVIDs:         3.20 cm  LV e' lateral:   7.18 cm/s LV PW:         1.50 cm  LV E/e' lateral: 14.5 LV IVS:        1.30 cm LVOT diam:     1.70 cm LV SV:         69 LV SV Index:   41 LVOT Area:     2.27 cm  RIGHT VENTRICLE             IVC RV S prime:     14.40 cm/s  IVC diam: 1.30 cm TAPSE (M-mode): 3.0 cm LEFT ATRIUM             Index       RIGHT ATRIUM           Index LA diam:        3.90 cm 2.29 cm/m  RA Area:     16.50 cm LA Vol (A2C):   75.5 ml 44.37 ml/m RA Volume:   43.70 ml  25.68 ml/m LA Vol (A4C):   85.4 ml 50.18 ml/m LA Biplane Vol: 81.8 ml 48.07 ml/m  AORTIC VALVE AV Area (Vmax):    1.04 cm AV Area (Vmean):   1.02 cm AV Area (VTI):     1.06 cm AV Vmax:           326.00 cm/s AV Vmean:          230.000 cm/s AV VTI:            0.654 m AV Peak Grad:      42.5 mmHg  AV Mean Grad:      24.0 mmHg LVOT Vmax:         150.00 cm/s LVOT Vmean:        103.000 cm/s LVOT VTI:          0.306 m LVOT/AV VTI ratio: 0.47  AORTA Ao Asc diam: 3.50 cm MITRAL VALVE                TRICUSPID VALVE MV Area (PHT): 3.91 cm     TR Peak grad:   28.9 mmHg MV Decel Time: 194 msec     TR Vmax:        269.00 cm/s MV E velocity: 104.00 cm/s MV A velocity: 143.00 cm/s  SHUNTS MV E/A ratio:  0.73         Systemic VTI:  0.31 m                             Systemic Diam: 1.70 cm Rudean Haskell MD Electronically signed by Rudean Haskell MD Signature Date/Time: 09/09/2020/4:46:48 PM    Final     Cardiac Studies   Echo:  1. Left ventricular ejection fraction, by estimation, is 60%. The left  ventricle has normal function. The left ventricle are septal wall motion  abnormalities described below. There is mild concentric left ventricular  hypertrophy with severe hypetrophy  of the basal-septal segment. Left ventricular diastolic parameters are  indeterminate in the setting of significant mitral annular calcification   but suggestive of increase left atrial pressure.   2. Right ventricular systolic function is normal. The right ventricular  size is normal. There is normal pulmonary artery systolic pressure.   3. A small pericardial effusion is present. The pericardial effusion is  circumferential.   4. The mitral valve is degenerative. Mild mitral valve regurgitation.  Severe mitral annular calcification.   5. The aortic valve is tricuspid. There is mild calcification of the  aortic valve. There is moderate thickening of the aortic valve. Aortic  valve regurgitation is not visualized. Mild aortic valve stenosis. Aortic  valve mean gradient measures 24.0 mmHg.   Aortic valve Vmax measures 3.26 m/s.   6. Left atrial size was severely dilated.    Diagnostic cath Dominance: Right        Patient Profile     60 y.o. 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 Tyrell Antonio  Assessment & Plan    ACUTE ON CHRONIC DIASTOLIC HF:     Volume managed per dialysis.    NSTEMI:     Disease as above and films reviewed with Dr. Saunders Revel.   Seen by Dr. Cyndia Bent and the patient will need CABG.    Tentatively planned for Thursday.  MRI pending as there were some lumbar spine issues and MRI is needed to exclude the possibility of osteomyelitis.   OK to stop heparin.   HTN:    BPs are better but still chronically elevated. On IV NTG.  I will  continue this pending  surgery.    ANEMIA:  Status post transfusion.  HGB drifting down without signs of active bleeding.  Primary team is working this up and nephrology has ordered another transfusion.     MURMUR:  Mild AS.  I agree with conservative therapy without valve replacement.    For questions or updates, please contact [place] Please consult [email or web address] for contact info under Cardiology/STEMI.   Signed, Minus Breeding, MD  09/11/2020, 10:13 AM

## 2020-09-11 NOTE — Progress Notes (Signed)
PROGRESS NOTE    Jodi Bell  ASN:[id number] DOB: 06-02-1960 DOA: 09/08/2020 PCP: [name]., MD   Brief Narrative: 60 year old with past medical history significant for ESRD on hemodialysis, PAD, COPD, RA on chronic steroids, CAD, PAD, diabetes, hypertension on 5 blood pressure medications who presents complaining of worsening shortness of breath the day of admission.  Patient had full hemodialysis the day prior to admission.  She has not missed hemodialysis.  Patient is a current smoker.  Patient was found to be in severe respiratory distress, tachypneic, blood pressure significantly elevated systolic 193.  Chest x-ray shows severe fluid overload and pulmonary edema.  She was started on heparin drip nephrology consulted.  Patient underwent hemodialysis on admission.  After hemodialysis she was transition from BiPAP to nasal cannula oxygen.  Patient was found to have Severe two vessels CAD. Plan is for CABG depending on Lumbar spine MRI.   Assessment & Plan:   Active Problems:   Pulmonary edema   CHF (congestive heart failure) (HCC)   Non-ST elevation (NSTEMI) myocardial infarction (HCC)  1-Acute Hypoxic respiratory failure, Acute on Chronic Diastolic Heart Failure, Decompensated: Related to volume overload.  Volume management with HD>  Continue with BIPAP as needed.  On 2 L oxygen. Improving. Plan for HD  8/03.  2-Hypertensive emergency: Continue with Lopressor, clonidine increase to home dose.  Continue with Norvasc and Hydralazine. Continue with nitroglycerin gtt. 7/31.  Anemia; of chronic diseases;  Drop in hb.  Had Blood transfusion 7/31 CT abdomen negative for retroperitoneal bleed.  Occult blood ordered. Pending.  Plan for second unit PRBC with HD 8/03.  COPD: Taper prednisone to home dose. Start Home dose 5 mg daily 8/03. Continue with inhaler.   NSTEMI; Elevation of troponins;CAD: Received Heparin Gtt. Discussed with cardiology, ok to discontinue heparin  Gtt.  Received transfusion with HD>  Nitroglycerin gtt started.  Underwent Cath:  "Severe two-vessel coronary artery disease with heavily calcified disease of up to 80% involving the proximal and mid LAD as well as up to 90% involving the mid RCA. Mild-moderate, non-obstructive disease noted in the LCx territory. Mildly elevated left ventricular filling pressure (LVEDP 15-20 mmHg)." CVTS consulted. Plan for CABG on Thursday depending on Lumbar MRI.  ESRD on hemodialysis She underwent emergency dialysis night of admission. Appreciate nephrology assistance.  Diabetes type 2: Continue with Januvia SSI.  Hb A1 5.2  Current smoker: Counseling provided.   Abnormal CT scan finding: Erosion of superior endplate of L3 and inferior endplate of L2 with air in the disc space.  The left L3 pedicle screw extending into the inferior aspect of the disc space.  The adjacent soft tissues are not well assessed due to streak artifact of the hardware.  Discitis/osteomyelitis not excluded at L3-L4. -Patient reports prior history of infection in her back. -ESR at 30 not significantly elevated,  CRP: 5.7  and Blood cultures: No growth to date.  -Plan to get MRI of the lumbar spine  Gallbladder wall thickening and or pericholecystic fluid: She denies abdominal pain, nausea or vomiting.  Monitor for now.  Constipation: Started  MiraLAX, Received Sorbitol. Plan for lactulose. No BM yet   Estimated body mass index is 25.81 kg/m as calculated from the following:   Height as of this encounter: $RemoveBeforeD'5\' 2"'DYqzBOPCeEwqMn$  (1.575 m).   Weight as of this encounter: 64 kg.   DVT prophylaxis: Heparin gtt Code Status: Full Code Family Communication: Care discussed with patient Disposition Plan:  Status is: Inpatient  Remains inpatient appropriate  because:IV treatments appropriate due to intensity of illness or inability to take PO  Dispo: The patient is from: Home              Anticipated d/c is to: Home              Patient  currently is not medically stable to d/c.   Difficult to place patient No        Consultants:  Cardiology Nephrology   Procedures:  ECHO  Antimicrobials:    Subjective: She would like her diet to be change to just renal diet.  No BM yet. Had episode of chest pain last night.   Objective: Vitals:   09/11/20 0100 09/11/20 0355 09/11/20 0500 09/11/20 0600  BP: (!) 154/70 (!) 161/72  (!) 159/75  Pulse: 76 73  76  Resp: $Remo'13 13  14  'VkwcI$ Temp:  97.9 F (36.6 C)  98.1 F (36.7 C)  TempSrc:  Oral  Oral  SpO2: 99% 100%  99%  Weight:   64 kg   Height:        Intake/Output Summary (Last 24 hours) at 09/11/2020 0715 Last data filed at 09/10/2020 1500 Gross per 24 hour  Intake 557.26 ml  Output --  Net 557.26 ml    Filed Weights   09/10/20 1238 09/11/20 0046 09/11/20 0500  Weight: 63.4 kg 64 kg 64 kg    Examination:  General exam: NAD Respiratory system:  BL crackles.  Cardiovascular system: S 1, S 2 RRR Gastrointestinal system: BS present, Soft, nt Central nervous system: Alert, oriented.  Extremities: Symmetric power.    Data Reviewed: I have personally reviewed following labs and imaging studies  CBC: Recent Labs  Lab 09/08/20 1221 09/09/20 0300 09/09/20 1204 09/10/20 0251 09/11/20 0358  WBC 7.1 5.2  --  4.1 5.9  NEUTROABS 4.7  --   --   --   --   HGB 9.2* 7.5* 8.2* 9.2* 7.8*  HCT 28.1* 22.8* 24.3* 27.0* 23.7*  MCV 101.1* 100.4*  --  96.1 97.5  PLT 243 171  --  188 213    Basic Metabolic Panel: Recent Labs  Lab 09/08/20 1221 09/09/20 0300 09/10/20 0251 09/11/20 0358  NA 134* 135 133* 132*  K 4.4 3.8 3.5 4.1  CL 97* 97* 96* 93*  CO2 $Re'29 31 27 28  'TOe$ GLUCOSE 103* 82 198* 120*  BUN $Re'18 10 8 19  'NLv$ CREATININE 3.86* 3.14* 2.71* 4.36*  CALCIUM 10.3 9.2 9.9 10.5*  MG 1.9  --   --   --     GFR: Estimated Creatinine Clearance: 12.1 mL/min (A) (by C-G formula based on SCr of 4.36 mg/dL (H)). Liver Function Tests: No results for input(s): AST, ALT,  ALKPHOS, BILITOT, PROT, ALBUMIN in the last 168 hours. No results for input(s): LIPASE, AMYLASE in the last 168 hours. No results for input(s): AMMONIA in the last 168 hours. Coagulation Profile: Recent Labs  Lab 09/08/20 1800  INR 1.1    Cardiac Enzymes: No results for input(s): CKTOTAL, CKMB, CKMBINDEX, TROPONINI in the last 168 hours. BNP (last 3 results) No results for input(s): PROBNP in the last 8760 hours. HbA1C: Recent Labs    09/09/20 0300  HGBA1C 5.2    CBG: Recent Labs  Lab 09/10/20 1054 09/10/20 1416 09/10/20 1619 09/10/20 2050 09/11/20 0601  GLUCAP 130* 142* 166* 146* 110*    Lipid Profile: Recent Labs    09/10/20 0251  CHOL 193  HDL 69  LDLCALC 112*  TRIG 60  CHOLHDL 2.8    Thyroid Function Tests: No results for input(s): TSH, T4TOTAL, FREET4, T3FREE, THYROIDAB in the last 72 hours. Anemia Panel: No results for input(s): VITAMINB12, FOLATE, FERRITIN, TIBC, IRON, RETICCTPCT in the last 72 hours. Sepsis Labs: No results for input(s): PROCALCITON, LATICACIDVEN in the last 168 hours.  Recent Results (from the past 240 hour(s))  Resp Panel by RT-PCR (Flu A&B, Covid) Nasopharyngeal Swab     Status: None   Collection Time: 09/08/20  1:21 PM   Specimen: Nasopharyngeal Swab; Nasopharyngeal(NP) swabs in vial transport medium  Result Value Ref Range Status   SARS Coronavirus 2 by RT PCR NEGATIVE NEGATIVE Final    Comment: (NOTE) SARS-CoV-2 target nucleic acids are NOT DETECTED.  The SARS-CoV-2 RNA is generally detectable in upper respiratory specimens during the acute phase of infection. The lowest concentration of SARS-CoV-2 viral copies this assay can detect is 138 copies/mL. A negative result does not preclude SARS-Cov-2 infection and should not be used as the sole basis for treatment or other patient management decisions. A negative result may occur with  improper specimen collection/handling, submission of specimen other than nasopharyngeal  swab, presence of viral mutation(s) within the areas targeted by this assay, and inadequate number of viral copies(<138 copies/mL). A negative result must be combined with clinical observations, patient history, and epidemiological information. The expected result is Negative.  Fact Sheet for Patients:  EntrepreneurPulse.com.au  Fact Sheet for Healthcare Providers:  IncredibleEmployment.be  This test is no t yet approved or cleared by the [place] FDA and  has been authorized for detection and/or diagnosis of SARS-CoV-2 by FDA under an Emergency Use Authorization (EUA). This EUA will remain  in effect (meaning this test can be used) for the duration of the COVID-19 declaration under Section 564(b)(1) of the Act, 21 U.S.C.section 360bbb-3(b)(1), unless the authorization is terminated  or revoked sooner.       Influenza A by PCR NEGATIVE NEGATIVE Final   Influenza B by PCR NEGATIVE NEGATIVE Final    Comment: (NOTE) The Xpert Xpress SARS-CoV-2/FLU/RSV plus assay is intended as an aid in the diagnosis of influenza from Nasopharyngeal swab specimens and should not be used as a sole basis for treatment. Nasal washings and aspirates are unacceptable for Xpert Xpress SARS-CoV-2/FLU/RSV testing.  Fact Sheet for Patients: EntrepreneurPulse.com.au  Fact Sheet for Healthcare Providers: IncredibleEmployment.be  This test is not yet approved or cleared by the [place] FDA and has been authorized for detection and/or diagnosis of SARS-CoV-2 by FDA under an Emergency Use Authorization (EUA). This EUA will remain in effect (meaning this test can be used) for the duration of the COVID-19 declaration under Section 564(b)(1) of the Act, 21 U.S.C. section 360bbb-3(b)(1), unless the authorization is terminated or revoked.  Performed at [facility], [address] [place], [place] [address]    Culture, blood (routine x 2)     Status: None (Preliminary result)   Collection Time: 09/09/20  4:52 PM   Specimen: BLOOD LEFT WRIST  Result Value Ref Range Status   Specimen Description BLOOD LEFT WRIST  Final   Special Requests   Final    BOTTLES DRAWN AEROBIC ONLY Blood Culture results may not be optimal due to an inadequate volume of blood received in culture bottles   Culture   Final    NO GROWTH < 24 HOURS Performed at [facility], [address], [place], [place] [address]    Report Status PENDING  Incomplete  Culture,  blood (routine x 2)     Status: None (Preliminary result)   Collection Time: 09/09/20  5:03 PM   Specimen: BLOOD RIGHT WRIST  Result Value Ref Range Status   Specimen Description BLOOD RIGHT WRIST  Final   Special Requests   Final    BOTTLES DRAWN AEROBIC ONLY Blood Culture results may not be optimal due to an inadequate volume of blood received in culture bottles   Culture   Final    NO GROWTH < 24 HOURS Performed at [facility], [address] [place],  [address]    Report Status PENDING  Incomplete          Radiology Studies: CT ABDOMEN PELVIS WO CONTRAST  Result Date: 09/09/2020 CLINICAL DATA:  Anemia.  Abdominal pain. EXAM: CT ABDOMEN AND PELVIS WITHOUT CONTRAST TECHNIQUE: Multidetector CT imaging of the abdomen and pelvis was performed following the standard protocol without IV contrast. COMPARISON:  None FINDINGS: Lower chest: Bilateral pleural effusions with underlying atelectasis. Cardiomegaly. Three-vessel coronary artery disease. Hepatobiliary: Gallbladder wall thickening and/or pericholecystic fluid identified. Liver is unremarkable on unenhanced images. Pancreas: Unremarkable. No pancreatic ductal dilatation or surrounding inflammatory changes. Spleen: Normal in size without focal abnormality. Adrenals/Urinary Tract: Adrenal glands are unremarkable. Bilateral renal cysts are identified. Symmetric perinephric stranding is  likely nonacute. No hydronephrosis. No renal stones or suspicious masses are identified. No ureterectasis or ureteral stones. The bladder is decompressed but grossly unremarkable. Stomach/Bowel: The stomach and small bowel are unremarkable. Scattered colonic diverticulosis is identified without diverticulitis. Moderate fecal loading seen throughout the colon. The appendix is normal in appearance. Vascular/Lymphatic: Calcified atherosclerosis is seen in the tortuous nonaneurysmal aorta. No adenopathy. Reproductive: Uterus and bilateral adnexa are unremarkable. Other: Increased attenuation in the subcutaneous fat consistent with volume overload. A small amount of fluid in the left pericolic gutter is likely due to volume overload. No free air. No fluid collection. Musculoskeletal: Pedicle rods and screws are seen at L3 and L4. A disc spacer device is seen at L3-4. Erosive changes are seen at the superior endplate of L3 and the inferior endplate of L2. There is focal kyphosis in this region. There is air in the disc space in this region. The left pedicle screw at L3 extends superior to the eroded superior endplate into the inferior aspect of the disc space. IMPRESSION: 1. Erosion of the superior endplate of L3 and the inferior endplate of L2 with air in the disc space. The left L3 pedicle screw extends into the inferior aspect of the disc space. The adjacent soft tissues are not well assessed due to streak artifact off the hardware. Discitis/osteomyelitis not excluded at L3-4. 2. Bilateral pleural effusions with underlying atelectasis. Cardiomegaly. Three-vessel coronary artery disease. 3. Gallbladder wall thickening and/or pericholecystic fluid. A right upper quadrant ultrasound could better evaluate the gallbladder. 4. Moderate fecal loading in the colon. 5. Calcified atherosclerosis in the abdominal aorta. 6. Volume overload with increased attenuation in the subcutaneous fat diffusely and a small amount of fluid in  the pericolic gutter. These results will be called to the ordering clinician or representative by the Radiologist Assistant, and communication documented in the PACS or [employer]. Electronically Signed   By: Dorise Bullion III M.D   On: 09/09/2020 14:38   DG Chest 1 View  Result Date: 09/09/2020 CLINICAL DATA:  CHF.  Shortness of breath. EXAM: CHEST  1 VIEW COMPARISON:  September 08, 2020 FINDINGS: No pneumothorax. Stable cardiomegaly. The hila and mediastinum are unchanged. Diffuse  interstitial opacities in the lungs are improved in the interval but do persist. Probable small left effusion with underlying atelectasis. No other interval changes or acute abnormalities. IMPRESSION: Stable cardiomegaly. Persistent but improving pulmonary edema. Persistent small left effusion with underlying atelectasis. Electronically Signed   By: Dorise Bullion III M.D   On: 09/09/2020 09:18   CARDIAC CATHETERIZATION  Result Date: 09/10/2020 Conclusions: Severe two-vessel coronary artery disease with heavily calcified disease of up to 80% involving the proximal and mid LAD as well as up to 90% involving the mid RCA. Mild-moderate, non-obstructive disease noted in the LCx territory. Mildly elevated left ventricular filling pressure (LVEDP 15-20 mmHg). Recommendations: Given severe two-vessel CAD with heavy, eccentric calcium (including in the proximal LAD) and the patient's history of diabetes, recommend cardiac surgery consultation for CABG. Improve blood pressure control and wean IV nitroglycerin, as tolerated. Nelva Bush, MD [facility]  ECHOCARDIOGRAM COMPLETE  Result Date: 09/09/2020    ECHOCARDIOGRAM REPORT   Patient Name:   Jodi Bell Date of Exam: 09/09/2020 Medical Rec #:  [id number]        Height:       62.0 in Accession #:    [id number]       Weight:       152.1 lb Date of Birth:  29-Jun-1960         BSA:          1.702 m Patient Age:    46 years         BP:           161/94 mmHg Patient Gender: F                 HR:           82 bpm. Exam Location:  Inpatient Procedure: 2D Echo Indications:    NSTEMI  History:        Patient has prior history of Echocardiogram examinations, most                 recent 07/04/2020. CHF, end stage renal disease and COPD; Risk                 Factors:Sleep Apnea, Hypertension and Diabetes.  Sonographer:    [name] Referring Phys: 68 [place]  1. Left ventricular ejection fraction, by estimation, is 60%. The left ventricle has normal function. The left ventricle are septal wall motion abnormalities described below. There is mild concentric left ventricular hypertrophy with severe hypetrophy of the basal-septal segment. Left ventricular diastolic parameters are indeterminate in the setting of significant mitral annular calcification but suggestive of increase left atrial pressure.  2. Right ventricular systolic function is normal. The right ventricular size is normal. There is normal pulmonary artery systolic pressure.  3. A small pericardial effusion is present. The pericardial effusion is circumferential.  4. The mitral valve is degenerative. Mild mitral valve regurgitation. Severe mitral annular calcification.  5. The aortic valve is tricuspid. There is mild calcification of the aortic valve. There is moderate thickening of the aortic valve. Aortic valve regurgitation is not visualized. Mild aortic valve stenosis. Aortic valve mean gradient measures 24.0 mmHg.  Aortic valve Vmax measures 3.26 m/s.  6. Left atrial size was severely dilated. Comparison(s): A prior study was performed on 07/04/20. Prior images reviewed side by side. Septal hypokinesis appears new. FINDINGS  Left Ventricle: Left ventricular ejection fraction, by estimation, is 60%. The left ventricle has normal function. The left ventricle demonstrates  regional wall motion abnormalities. The left ventricular internal cavity size was normal in size. There is  mild concentric left ventricular  hypertrophy of the basal-septal segment. Left ventricular diastolic parameters are indeterminate.  LV Wall Scoring: The anterior septum, mid inferoseptal segment, and basal inferoseptal segment are hypokinetic. Right Ventricle: The right ventricular size is normal. No increase in right ventricular wall thickness. Right ventricular systolic function is normal. There is normal pulmonary artery systolic pressure. The tricuspid regurgitant velocity is 2.69 m/s, and  with an assumed right atrial pressure of 3 mmHg, the estimated right ventricular systolic pressure is 07.6 mmHg. Left Atrium: Left atrial size was severely dilated. Right Atrium: Right atrial size was normal in size. Pericardium: A small pericardial effusion is present. The pericardial effusion is circumferential. Mitral Valve: The mitral valve is degenerative in appearance. Severe mitral annular calcification. Mild mitral valve regurgitation. Tricuspid Valve: The tricuspid valve is normal in structure. Tricuspid valve regurgitation is trivial. No evidence of tricuspid stenosis. Aortic Valve: The aortic valve is tricuspid. There is mild calcification of the aortic valve. There is moderate thickening of the aortic valve. Aortic valve regurgitation is not visualized. Mild aortic stenosis is present. Aortic valve mean gradient measures 24.0 mmHg. Aortic valve peak gradient measures 42.5 mmHg. Aortic valve area, by VTI measures 1.06 cm. Pulmonic Valve: The pulmonic valve was normal in structure. Pulmonic valve regurgitation is not visualized. No evidence of pulmonic stenosis. Aorta: The aortic root and ascending aorta are structurally normal, with no evidence of dilitation. IAS/Shunts: The atrial septum is grossly normal.  LEFT VENTRICLE PLAX 2D LVIDd:         4.90 cm  Diastology LVIDs:         3.20 cm  LV e' lateral:   7.18 cm/s LV PW:         1.50 cm  LV E/e' lateral: 14.5 LV IVS:        1.30 cm LVOT diam:     1.70 cm LV SV:         69 LV SV Index:   41 LVOT  Area:     2.27 cm  RIGHT VENTRICLE             IVC RV S prime:     14.40 cm/s  IVC diam: 1.30 cm TAPSE (M-mode): 3.0 cm LEFT ATRIUM             Index       RIGHT ATRIUM           Index LA diam:        3.90 cm 2.29 cm/m  RA Area:     16.50 cm LA Vol (A2C):   75.5 ml 44.37 ml/m RA Volume:   43.70 ml  25.68 ml/m LA Vol (A4C):   85.4 ml 50.18 ml/m LA Biplane Vol: 81.8 ml 48.07 ml/m  AORTIC VALVE AV Area (Vmax):    1.04 cm AV Area (Vmean):   1.02 cm AV Area (VTI):     1.06 cm AV Vmax:           326.00 cm/s AV Vmean:          230.000 cm/s AV VTI:            0.654 m AV Peak Grad:      42.5 mmHg AV Mean Grad:      24.0 mmHg LVOT Vmax:         150.00 cm/s LVOT Vmean:  103.000 cm/s LVOT VTI:          0.306 m LVOT/AV VTI ratio: 0.47  AORTA Ao Asc diam: 3.50 cm MITRAL VALVE                TRICUSPID VALVE MV Area (PHT): 3.91 cm     TR Peak grad:   28.9 mmHg MV Decel Time: 194 msec     TR Vmax:        269.00 cm/s MV E velocity: 104.00 cm/s MV A velocity: 143.00 cm/s  SHUNTS MV E/A ratio:  0.73         Systemic VTI:  0.31 m                             Systemic Diam: 1.70 cm Rudean Haskell MD Electronically signed by Rudean Haskell MD Signature Date/Time: 09/09/2020/4:46:48 PM    Final         Scheduled Meds:  sodium chloride   Intravenous Once   allopurinol  100 mg Oral QPM   amLODipine  10 mg Oral Daily   aspirin EC  81 mg Oral Daily   calcium acetate  2,001 mg Oral TID WC   chlorhexidine  15 mL Mouth Rinse BID   Chlorhexidine Gluconate Cloth  6 each Topical Q0600   cholecalciferol  1,000 Units Oral QPM   cinacalcet  30 mg Oral Q T,Th,Sa-HD   cloNIDine  0.3 mg Oral TID   ferrous sulfate  325 mg Oral Once per day on Mon Wed Fri   fluticasone  1 spray Each Nare Daily   folic acid  1 mg Oral Daily   furosemide  80 mg Oral BID   hydrALAZINE  100 mg Oral Q8H   insulin aspart  0-6 Units Subcutaneous TID WC   irbesartan  300 mg Oral Daily   labetalol  400 mg Oral TID   linagliptin  5  mg Oral Daily   loratadine  10 mg Oral Daily   mouth rinse  15 mL Mouth Rinse q12n4p   montelukast  10 mg Oral Q supper   nicotine  7 mg Transdermal Daily   pantoprazole  40 mg Oral Daily   polyethylene glycol  17 g Oral Daily   polyvinyl alcohol  1 drop Both Eyes TID AC & HS   predniSONE  10 mg Oral Q breakfast   saccharomyces boulardii  250 mg Oral Daily   sodium chloride flush  3 mL Intravenous Q12H   sorbitol  30 mL Oral Once   Tofacitinib Citrate  5 mg Oral QHS   umeclidinium bromide  1 puff Inhalation Daily   zolpidem  5 mg Oral QHS   Continuous Infusions:  sodium chloride     sodium chloride     heparin 1,400 Units/hr (09/11/20 0516)   nitroGLYCERIN 70 mcg/min (09/11/20 0517)     LOS: 3 days    Time spent: 35 minutes.     Elmarie Shiley, MD [facility]   If 7PM-7AM, please contact night-coverage [email or web address]  09/11/2020, 7:15 AM

## 2020-09-11 NOTE — Progress Notes (Signed)
[place] for Heparin Indication: chest pain/ACS  Allergies  Allergen Reactions   3-Methyl-2-Benzothiazolinone Hydrazone Other (See Comments)    Muscle weakness muscle cramping in legs Other reaction(s): Myalgias (Muscle Pain)   Banana Anaphylaxis and Swelling    TONGUE AND MOUTH SWELLING   Black Walnut Flavor Anaphylaxis    Walnuts   Hazelnut ([place]) Allergy Skin Test Anaphylaxis    Hazelnuts   Leflunomide And Related Other (See Comments)    Severe Headache   Lisinopril Swelling    Angioedema  Swelling of the face   No [employer] Food Allergies Anaphylaxis    Spicy Mustard 4.7.2021 Pt reports that she eats Regular Yellow Mustard   Other Other (See Comments), Anaphylaxis and Swelling    Cosentyx= angioedema  Other reaction(s): Facial Edema (intolerance) Mouth itches and tongue swelling Hazel nuts and pecans also Walnuts Walnuts Renal failure Other reaction(s): Other Serotonin Syndrome  Serotonin syndrome  Tremors Other reaction(s): Other Tremors Muscle weakness muscle cramping in legs Other reaction(s): Myalgias (Muscle Pain) Mouth itches and tongue swelling Hazel nuts and pecans also Walnuts    Pecan Extract Allergy Skin Test Anaphylaxis    Pecans    Pecan Nut (Diagnostic) Anaphylaxis    Pecans   Trazodone And Nefazodone Other (See Comments)    Serotonin Syndrome  Tremors   Adalimumab Other (See Comments)    Blisters on abdomen =humira   Escitalopram Oxalate Other (See Comments)    Hand problems - Serotonin Syndrome     Ezetimibe Other (See Comments)    myalgias cramps    Secukinumab Swelling    Cosentyx = angiodema    Statins Other (See Comments)    Leg pains and weakness   Ibuprofen Other (See Comments)    Renal Problems    Patient Measurements: Height: '5\' 2"'$  (157.5 cm) Weight: 64 kg (141 lb 1.5 oz) IBW/kg (Calculated) : 50.1 Heparin Dosing Weight: 64.2 kg  Vital Signs: Temp: 97.9 F (36.6 C)  (08/02 0355) Temp Source: Oral (08/02 0355) BP: 161/72 (08/02 0355) Pulse Rate: 73 (08/02 0355)  Labs: Recent Labs    09/08/20 1221 09/08/20 1221 09/08/20 1430 09/08/20 1800 09/09/20 0300 09/09/20 0605 09/09/20 1204 09/10/20 0251 09/10/20 0827 09/11/20 0358  HGB 9.2*  --   --   --  7.5*  --  8.2* 9.2*  --  7.8*  HCT 28.1*  --   --   --  22.8*  --  24.3* 27.0*  --  23.7*  PLT 243  --   --   --  171  --   --  188  --  175  LABPROT  --   --   --  14.4  --   --   --   --   --   --   INR  --   --   --  1.1  --   --   --   --   --   --   HEPARINUNFRC  --    < >  --   --  0.12*  --  0.21*  --  0.57 0.21*  CREATININE 3.86*  --   --   --  3.14*  --   --  2.71*  --   --   TROPONINIHS 40*  --  126* 330*  --  1,499*  --   --   --   --    < > = values in this interval not displayed.  Estimated Creatinine Clearance: 19.4 mL/min (A) (by C-G formula based on SCr of 2.71 mg/dL (H)).   Medical History: Past Medical History:  Diagnosis Date   Anemia of chronic disease    Anxiety    Arthritis    oa and psoriatic ra   Asthma    Back pain, chronic    lower back   Candida infection finished tx 2 days ago   Chronic insomnia    COPD (chronic obstructive pulmonary disease) (HCC)    Coronary artery calcification seen on CT scan    Diabetes mellitus    type 2   Eczema    ESRD on dialysis [facility])    Essential hypertension    GERD (gastroesophageal reflux disease)    Gout    History of hiatal hernia    Hypoalbuminemia    Hyponatremia    Mild aortic stenosis    Mitral regurgitation    Mitral stenosis    Obesity    PAD (peripheral artery disease) ([place])    a. externial iliac calcification seen on CT 04/2020.   Pneumonia few yrs ago x 2   Sleep apnea    Tobacco abuse    Upper GI bleed 05/2019    Infusions:   sodium chloride     sodium chloride     heparin 1,300 Units/hr (09/10/20 2300)   nitroGLYCERIN 65 mcg/min (09/11/20 0410)   Assessment: 60 yo female presenting with SOB.  Patient is ESRD on HD MWF, received emergent session 7/30. No PTA anticoagulation. Pharmacy consulted to dose heparin for elevated troponin [id number]).   8/2 AM update: Heparin level low  Likely CABG 8/4  Goal of Therapy:  Heparin level 0.3-0.7 units/ml Monitor platelets by anticoagulation protocol: Yes   Plan:  Inc heparin to 1400 units/hr 1300 heparin level CABG this week  Narda Bonds, PharmD, BCPS Clinical Pharmacist Phone: [phone]

## 2020-09-11 NOTE — Progress Notes (Signed)
[facility] Progress Note   Subjective: Seen in room, denies chest pain/SOB. Concerned about having HD post cath but assured this is not an issue for her. Plans for CABG 08/04 per Dr. Cyndia Bent. HD tomorrow on schedule.    Objective Vitals:   09/11/20 0650 09/11/20 0700 09/11/20 0710 09/11/20 0810  BP:  (!) 158/73  (!) 156/78  Pulse: 73 73 73   Resp: '14 17 15 16  '$ Temp:  98.4 F (36.9 C)    TempSrc:  Oral    SpO2: 100% 99% 100%   Weight:      Height:       Physical Exam General: chronically ill appearing female in NAD Heart: [id number] RRR 3/6 systolic M. Mild JVD at 30 degrees.  Lungs: Few crackles L base otherwise CTAB, no WOB.  Abdomen:NABS Extremities:No LE edema Dialysis Access: L AVF + T/B   Additional Objective Labs: Basic Metabolic Panel: Recent Labs  Lab 09/09/20 0300 09/10/20 0251 09/11/20 0358  NA 135 133* 132*  K 3.8 3.5 4.1  CL 97* 96* 93*  CO2 '31 27 28  '$ GLUCOSE 82 198* 120*  BUN '10 8 19  '$ CREATININE 3.14* 2.71* 4.36*  CALCIUM 9.2 9.9 10.5*   Liver Function Tests: No results for input(s): AST, ALT, ALKPHOS, BILITOT, PROT, ALBUMIN in the last 168 hours. No results for input(s): LIPASE, AMYLASE in the last 168 hours. CBC: Recent Labs  Lab 09/08/20 1221 09/09/20 0300 09/09/20 1204 09/10/20 0251 09/11/20 0358  WBC 7.1 5.2  --  4.1 5.9  NEUTROABS 4.7  --   --   --   --   HGB 9.2* 7.5* 8.2* 9.2* 7.8*  HCT 28.1* 22.8* 24.3* 27.0* 23.7*  MCV 101.1* 100.4*  --  96.1 97.5  PLT 243 171  --  188 175   Blood Culture    Component Value Date/Time   SDES BLOOD RIGHT WRIST 09/09/2020 1703   SPECREQUEST  09/09/2020 1703    BOTTLES DRAWN AEROBIC ONLY Blood Culture results may not be optimal due to an inadequate volume of blood received in culture bottles   CULT  09/09/2020 1703    NO GROWTH 2 DAYS Performed at [facility] [address] [place], [place] [address]    REPTSTATUS PENDING 09/09/2020 1703    Cardiac Enzymes: No results  for input(s): CKTOTAL, CKMB, CKMBINDEX, TROPONINI in the last 168 hours. CBG: Recent Labs  Lab 09/10/20 1054 09/10/20 1416 09/10/20 1619 09/10/20 2050 09/11/20 0601  GLUCAP 130* 142* 166* 146* 110*   Iron Studies: No results for input(s): IRON, TIBC, TRANSFERRIN, FERRITIN in the last 72 hours. '@lablastinr3'$ @ Studies/Results: CT ABDOMEN PELVIS WO CONTRAST  Result Date: 09/09/2020 CLINICAL DATA:  Anemia.  Abdominal pain. EXAM: CT ABDOMEN AND PELVIS WITHOUT CONTRAST TECHNIQUE: Multidetector CT imaging of the abdomen and pelvis was performed following the standard protocol without IV contrast. COMPARISON:  None FINDINGS: Lower chest: Bilateral pleural effusions with underlying atelectasis. Cardiomegaly. Three-vessel coronary artery disease. Hepatobiliary: Gallbladder wall thickening and/or pericholecystic fluid identified. Liver is unremarkable on unenhanced images. Pancreas: Unremarkable. No pancreatic ductal dilatation or surrounding inflammatory changes. Spleen: Normal in size without focal abnormality. Adrenals/Urinary Tract: Adrenal glands are unremarkable. Bilateral renal cysts are identified. Symmetric perinephric stranding is likely nonacute. No hydronephrosis. No renal stones or suspicious masses are identified. No ureterectasis or ureteral stones. The bladder is decompressed but grossly unremarkable. Stomach/Bowel: The stomach and small bowel are unremarkable. Scattered colonic diverticulosis is identified without diverticulitis. Moderate fecal loading seen  throughout the colon. The appendix is normal in appearance. Vascular/Lymphatic: Calcified atherosclerosis is seen in the tortuous nonaneurysmal aorta. No adenopathy. Reproductive: Uterus and bilateral adnexa are unremarkable. Other: Increased attenuation in the subcutaneous fat consistent with volume overload. A small amount of fluid in the left pericolic gutter is likely due to volume overload. No free air. No fluid collection.  Musculoskeletal: Pedicle rods and screws are seen at L3 and L4. A disc spacer device is seen at L3-4. Erosive changes are seen at the superior endplate of L3 and the inferior endplate of L2. There is focal kyphosis in this region. There is air in the disc space in this region. The left pedicle screw at L3 extends superior to the eroded superior endplate into the inferior aspect of the disc space. IMPRESSION: 1. Erosion of the superior endplate of L3 and the inferior endplate of L2 with air in the disc space. The left L3 pedicle screw extends into the inferior aspect of the disc space. The adjacent soft tissues are not well assessed due to streak artifact off the hardware. Discitis/osteomyelitis not excluded at L3-4. 2. Bilateral pleural effusions with underlying atelectasis. Cardiomegaly. Three-vessel coronary artery disease. 3. Gallbladder wall thickening and/or pericholecystic fluid. A right upper quadrant ultrasound could better evaluate the gallbladder. 4. Moderate fecal loading in the colon. 5. Calcified atherosclerosis in the abdominal aorta. 6. Volume overload with increased attenuation in the subcutaneous fat diffusely and a small amount of fluid in the pericolic gutter. These results will be called to the ordering clinician or representative by the Radiologist Assistant, and communication documented in the PACS or [employer]. Electronically Signed   By: Dorise Bullion III M.D   On: 09/09/2020 14:38   CARDIAC CATHETERIZATION  Result Date: 09/10/2020 Conclusions: Severe two-vessel coronary artery disease with heavily calcified disease of up to 80% involving the proximal and mid LAD as well as up to 90% involving the mid RCA. Mild-moderate, non-obstructive disease noted in the LCx territory. Mildly elevated left ventricular filling pressure (LVEDP 15-20 mmHg). Recommendations: Given severe two-vessel CAD with heavy, eccentric calcium (including in the proximal LAD) and the patient's history of  diabetes, recommend cardiac surgery consultation for CABG. Improve blood pressure control and wean IV nitroglycerin, as tolerated. Nelva Bush, MD [facility]  ECHOCARDIOGRAM COMPLETE  Result Date: 09/09/2020    ECHOCARDIOGRAM REPORT   Patient Name:   Jodi Bell Date of Exam: 09/09/2020 Medical Rec #:  [id number]        Height:       62.0 in Accession #:    [id number]       Weight:       152.1 lb Date of Birth:  1960-11-26         BSA:          1.702 m Patient Age:    60 years         BP:           161/94 mmHg Patient Gender: F                HR:           82 bpm. Exam Location:  Inpatient Procedure: 2D Echo Indications:    NSTEMI  History:        Patient has prior history of Echocardiogram examinations, most                 recent 07/04/2020. CHF, end stage renal disease and COPD; Risk  Factors:Sleep Apnea, Hypertension and Diabetes.  Sonographer:    [name] Referring Phys: [address] [place]  1. Left ventricular ejection fraction, by estimation, is 60%. The left ventricle has normal function. The left ventricle are septal wall motion abnormalities described below. There is mild concentric left ventricular hypertrophy with severe hypetrophy of the basal-septal segment. Left ventricular diastolic parameters are indeterminate in the setting of significant mitral annular calcification but suggestive of increase left atrial pressure.  2. Right ventricular systolic function is normal. The right ventricular size is normal. There is normal pulmonary artery systolic pressure.  3. A small pericardial effusion is present. The pericardial effusion is circumferential.  4. The mitral valve is degenerative. Mild mitral valve regurgitation. Severe mitral annular calcification.  5. The aortic valve is tricuspid. There is mild calcification of the aortic valve. There is moderate thickening of the aortic valve. Aortic valve regurgitation is not visualized. Mild aortic valve stenosis.  Aortic valve mean gradient measures 24.0 mmHg.  Aortic valve Vmax measures 3.26 m/s.  6. Left atrial size was severely dilated. Comparison(s): A prior study was performed on 07/04/20. Prior images reviewed side by side. Septal hypokinesis appears new. FINDINGS  Left Ventricle: Left ventricular ejection fraction, by estimation, is 60%. The left ventricle has normal function. The left ventricle demonstrates regional wall motion abnormalities. The left ventricular internal cavity size was normal in size. There is  mild concentric left ventricular hypertrophy of the basal-septal segment. Left ventricular diastolic parameters are indeterminate.  LV Wall Scoring: The anterior septum, mid inferoseptal segment, and basal inferoseptal segment are hypokinetic. Right Ventricle: The right ventricular size is normal. No increase in right ventricular wall thickness. Right ventricular systolic function is normal. There is normal pulmonary artery systolic pressure. The tricuspid regurgitant velocity is 2.69 m/s, and  with an assumed right atrial pressure of 3 mmHg, the estimated right ventricular systolic pressure is 123XX123 mmHg. Left Atrium: Left atrial size was severely dilated. Right Atrium: Right atrial size was normal in size. Pericardium: A small pericardial effusion is present. The pericardial effusion is circumferential. Mitral Valve: The mitral valve is degenerative in appearance. Severe mitral annular calcification. Mild mitral valve regurgitation. Tricuspid Valve: The tricuspid valve is normal in structure. Tricuspid valve regurgitation is trivial. No evidence of tricuspid stenosis. Aortic Valve: The aortic valve is tricuspid. There is mild calcification of the aortic valve. There is moderate thickening of the aortic valve. Aortic valve regurgitation is not visualized. Mild aortic stenosis is present. Aortic valve mean gradient measures 24.0 mmHg. Aortic valve peak gradient measures 42.5 mmHg. Aortic valve area, by VTI  measures 1.06 cm. Pulmonic Valve: The pulmonic valve was normal in structure. Pulmonic valve regurgitation is not visualized. No evidence of pulmonic stenosis. Aorta: The aortic root and ascending aorta are structurally normal, with no evidence of dilitation. IAS/Shunts: The atrial septum is grossly normal.  LEFT VENTRICLE PLAX 2D LVIDd:         4.90 cm  Diastology LVIDs:         3.20 cm  LV e' lateral:   7.18 cm/s LV PW:         1.50 cm  LV E/e' lateral: 14.5 LV IVS:        1.30 cm LVOT diam:     1.70 cm LV SV:         69 LV SV Index:   41 LVOT Area:     2.27 cm  RIGHT VENTRICLE  IVC RV S prime:     14.40 cm/s  IVC diam: 1.30 cm TAPSE (M-mode): 3.0 cm LEFT ATRIUM             Index       RIGHT ATRIUM           Index LA diam:        3.90 cm 2.29 cm/m  RA Area:     16.50 cm LA Vol (A2C):   75.5 ml 44.37 ml/m RA Volume:   43.70 ml  25.68 ml/m LA Vol (A4C):   85.4 ml 50.18 ml/m LA Biplane Vol: 81.8 ml 48.07 ml/m  AORTIC VALVE AV Area (Vmax):    1.04 cm AV Area (Vmean):   1.02 cm AV Area (VTI):     1.06 cm AV Vmax:           326.00 cm/s AV Vmean:          230.000 cm/s AV VTI:            0.654 m AV Peak Grad:      42.5 mmHg AV Mean Grad:      24.0 mmHg LVOT Vmax:         150.00 cm/s LVOT Vmean:        103.000 cm/s LVOT VTI:          0.306 m LVOT/AV VTI ratio: 0.47  AORTA Ao Asc diam: 3.50 cm MITRAL VALVE                TRICUSPID VALVE MV Area (PHT): 3.91 cm     TR Peak grad:   28.9 mmHg MV Decel Time: 194 msec     TR Vmax:        269.00 cm/s MV E velocity: 104.00 cm/s MV A velocity: 143.00 cm/s  SHUNTS MV E/A ratio:  0.73         Systemic VTI:  0.31 m                             Systemic Diam: 1.70 cm Rudean Haskell MD Electronically signed by Rudean Haskell MD Signature Date/Time: 09/09/2020/4:46:48 PM    Final    Medications:  sodium chloride     sodium chloride     heparin 1,400 Units/hr (09/11/20 0516)   nitroGLYCERIN 75 mcg/min (09/11/20 0725)    sodium chloride   Intravenous  Once   allopurinol  100 mg Oral QPM   amLODipine  10 mg Oral Daily   aspirin EC  81 mg Oral Daily   calcium acetate  2,001 mg Oral TID WC   chlorhexidine  15 mL Mouth Rinse BID   Chlorhexidine Gluconate Cloth  6 each Topical Q0600   cholecalciferol  1,000 Units Oral QPM   cinacalcet  30 mg Oral Q T,Th,Sa-HD   cloNIDine  0.3 mg Oral TID   ferrous sulfate  325 mg Oral Once per day on Mon Wed Fri   fluticasone  1 spray Each Nare Daily   folic acid  1 mg Oral Daily   furosemide  80 mg Oral BID   hydrALAZINE  100 mg Oral Q8H   insulin aspart  0-6 Units Subcutaneous TID WC   irbesartan  300 mg Oral Daily   labetalol  400 mg Oral TID   linagliptin  5 mg Oral Daily   loratadine  10 mg Oral Daily   mouth rinse  15 mL Mouth Rinse q12n4p  montelukast  10 mg Oral Q supper   nicotine  7 mg Transdermal Daily   pantoprazole  40 mg Oral Daily   polyethylene glycol  17 g Oral Daily   polyvinyl alcohol  1 drop Both Eyes TID AC & HS   predniSONE  10 mg Oral Q breakfast   saccharomyces boulardii  250 mg Oral Daily   sodium chloride flush  3 mL Intravenous Q12H   sorbitol  30 mL Oral Once   Tofacitinib Citrate  5 mg Oral QHS   umeclidinium bromide  1 puff Inhalation Daily   zolpidem  5 mg Oral QHS      Home meds include phoslo 3 ac tid, norvasc, allopurinol, sensipar 30 tiw mwf hd, capares 0.3 tid, lasix 40, neurontin 300 hs prn/ hydralazine 100 tid, imdur 60, januvia, labetalol 400 tid, singulair, renavit, sl ntg prn, olmesartan 300 hs, oxy IR prn, protonix, prednisone 5 qd, spiriva respimat, venotolin hfa, xeljanz 5 hs, prn's     CXR - 7/30 diffuse IS edema  CXR - 7/31 improving but persistent IS edema    OP HD: MWF [facility]  3.5h  2 /2.5 bath  dry wt pend   Hep none   LUA AVF  400/700   na 134  K 4.4 CO2 29   BUN 18  Cr 3.8   Assessment/ Plan Resp distress/ acute hypoxic resp failure - due to vol overload most likely. May be losing body weight. Needs records from her OP unit.  NSTEMI-Went for cardiac cath 09/11/20. 80% stenosis Prox LAD, 90% stenosis mid RCA. Mild AS/MR EF 60%. CT surgery (Dr. Cyndia Bent) consulted. CABG planned for 09/13/2020. Currently on NTG gtt 75 mcg/min.  ESRD - on HD MWF at [facility] [address] [place] [facility]. No acute HD needs today. Next HD on schedule 09/12/2020.  HTN/Volume - severe HTN, on 5 BP lowering meds at home. HD 09/09/2020 into AM 09/10/20. Net UF 2 liters. Few crackles L base, no LE edema. No WOB at present. UF as tolerated tomorrow.  Anemia - HGB 7.8 after 1 unit PRBCs 09/10/20. Denies bloody or tarry stools. Check FOBT. Transfuse 1 unit PRBCs with HD tomorrow.  COPD Gout PAD   Elivia Robotham H. Shontae Rosiles NP-C 09/11/2020, 9:53 AM  [employer] [phone]

## 2020-09-11 NOTE — Evaluation (Signed)
Physical Therapy Evaluation Patient Details Name: Jodi Bell MRN: [id number] DOB: 05/15/1960 Today's Date: 09/11/2020   History of Present Illness  Pt is a 60 y.o. female admitted 09/08/20 with acute onset SOB. Workup for CHF decompensaiton, COPD exacerbation, HTN urgency, NSTEMI; need for emergent HD. S/p LHC 8/1 which showed severe two-vessel CAD. Likely plan for CABG on 8/4. PMH includes ESRD (HD MWF), HTN, DM2, COPD, OSA, psoriatic RA, PAD, anemia, tobacco abuse.   Clinical Impression  Pt presents with an overall decrease in functional mobility secondary to above. PTA, pt independent, drives, primarily sedentary, lives with daughter's family. Today, pt ambulatory with RW at supervision-level; limited by c/o fatigue. Initiated educ re: sternal precautions and activity recommendations in preparation for potential CABG on 8/4. Pt would benefit from continued acute PT services to maximize functional mobility and independence prior to d/c home; will assess for follow-up PT needs post-op.     Follow Up Recommendations Other (comment) (TBD post-op)    Equipment Recommendations  Rollator   Recommendations for Other Services       Precautions / Restrictions Precautions Precautions: Fall;Other (comment) Precaution Comments: Initiated educ on sternal precautions for potential CABG 8/4 Restrictions Weight Bearing Restrictions: No      Mobility  Bed Mobility Overal bed mobility: Modified Independent             General bed mobility comments: HOB elevated    Transfers Overall transfer level: Modified independent Equipment used: Rolling walker (2 wheeled)             General transfer comment: Reliant on UE support to push into standing  Ambulation/Gait Ambulation/Gait assistance: Supervision Gait Distance (Feet): 40 Feet Assistive device: Rolling walker (2 wheeled) Gait Pattern/deviations: Step-through pattern;Decreased stride length;Trunk flexed Gait velocity:  Decreased   General Gait Details: Slow, steady, fatigued gait with RW and supervision for safety; pt declines further mobility secondary to fatigue  Stairs            Wheelchair Mobility    Modified Rankin (Stroke Patients Only)       Balance Overall balance assessment: Needs assistance   Sitting balance-Leahy Scale: Good       Standing balance-Leahy Scale: Fair                               Pertinent Vitals/Pain Pain Assessment: No/denies pain    Home Living Family/patient expects to be discharged to:: Private residence Living Arrangements: Children Available Help at Discharge: Family;Available PRN/intermittently Type of Home: House Home Access: Level entry     Home Layout: Multi-level;Able to live on main level with bedroom/bathroom Home Equipment: Gilford Rile - 2 wheels;Toilet riser;Tub bench;Cane - single point Additional Comments: Lives with daughter (who works from home) and son-in-law (who works later in day), another daughter lives nearby; family can provide near 24/7 upon return home if needed post-op    Prior Function Level of Independence: Independent with assistive device(s)         Comments: Mod indep with use of SPC majority of time; drives; HD MWF     Hand Dominance        Extremity/Trunk Assessment   Upper Extremity Assessment Upper Extremity Assessment: Generalized weakness    Lower Extremity Assessment Lower Extremity Assessment: Generalized weakness       Communication   Communication: No difficulties  Cognition Arousal/Alertness: Awake/alert Behavior During Therapy: WFL for tasks assessed/performed;Flat affect Overall Cognitive Status: Within Functional Limits for  tasks assessed                                        General Comments General comments (skin integrity, edema, etc.): Initiated educ on sternal precautions with regards to mobility and ADL tasks (in preparation for potential CABG this  week), handout provided    Exercises     Assessment/Plan    PT Assessment Patient needs continued PT services  PT Problem List Decreased strength;Decreased activity tolerance;Decreased balance;Decreased mobility;Decreased knowledge of precautions;Cardiopulmonary status limiting activity       PT Treatment Interventions DME instruction;Gait training;Stair training;Functional mobility training;Therapeutic activities;Therapeutic exercise;Balance training;Patient/family education    PT Goals (Current goals can be found in the Care Plan section)  Acute Rehab PT Goals Patient Stated Goal: return home with assist from family PT Goal Formulation: With patient Time For Goal Achievement: 09/25/20 Potential to Achieve Goals: Good    Frequency Min 3X/week   Barriers to discharge        Co-evaluation               AM-PAC PT "6 Clicks" Mobility  Outcome Measure Help needed turning from your back to your side while in a flat bed without using bedrails?: None Help needed moving from lying on your back to sitting on the side of a flat bed without using bedrails?: None Help needed moving to and from a bed to a chair (including a wheelchair)?: None Help needed standing up from a chair using your arms (e.g., wheelchair or bedside chair)?: None Help needed to walk in hospital room?: A Little Help needed climbing 3-5 steps with a railing? : A Little 6 Click Score: 22    End of Session   Activity Tolerance: Patient tolerated treatment well;Patient limited by fatigue Patient left: in chair;with call bell/phone within reach Nurse Communication: Mobility status PT Visit Diagnosis: Other abnormalities of gait and mobility (R26.89);Muscle weakness (generalized) (M62.81)    Time: [id number] PT Time Calculation (min) (ACUTE ONLY): 25 min   Charges:   PT Evaluation $PT Eval Moderate Complexity: 1 Mod PT Treatments $Therapeutic Activity: 8-22 mins      Mabeline Caras, PT, DPT Acute  Rehabilitation Services  Pager [phone] Office [place] 09/11/2020, 11:53 AM

## 2020-09-11 NOTE — Progress Notes (Signed)
Pts order for BIPAP is PRN at this time pt is not in need of BIPAP. Pt respiratory status is stable w/no distress noted. RT will continue to monitor.

## 2020-09-12 ENCOUNTER — Encounter (HOSPITAL_COMMUNITY): Payer: Self-pay | Admitting: Internal Medicine

## 2020-09-12 ENCOUNTER — Inpatient Hospital Stay (HOSPITAL_COMMUNITY): Payer: Medicare PPO

## 2020-09-12 DIAGNOSIS — I2510 Atherosclerotic heart disease of native coronary artery without angina pectoris: Secondary | ICD-10-CM

## 2020-09-12 DIAGNOSIS — I214 Non-ST elevation (NSTEMI) myocardial infarction: Secondary | ICD-10-CM | POA: Diagnosis not present

## 2020-09-12 DIAGNOSIS — I5033 Acute on chronic diastolic (congestive) heart failure: Secondary | ICD-10-CM | POA: Diagnosis not present

## 2020-09-12 LAB — PROTIME-INR
INR: 1.1 (ref 0.8–1.2)
Prothrombin Time: 14.5 seconds (ref 11.4–15.2)

## 2020-09-12 LAB — BASIC METABOLIC PANEL
Anion gap: 11 (ref 5–15)
BUN: 22 mg/dL — ABNORMAL HIGH (ref 6–20)
CO2: 26 mmol/L (ref 22–32)
Calcium: 10.4 mg/dL — ABNORMAL HIGH (ref 8.9–10.3)
Chloride: 92 mmol/L — ABNORMAL LOW (ref 98–111)
Creatinine, Ser: 5.59 mg/dL — ABNORMAL HIGH (ref 0.44–1.00)
GFR, Estimated: 8 mL/min — ABNORMAL LOW (ref >60)
Glucose, Bld: 122 mg/dL — ABNORMAL HIGH (ref 70–99)
Potassium: 4.3 mmol/L (ref 3.5–5.1)
Sodium: 129 mmol/L — ABNORMAL LOW (ref 135–145)

## 2020-09-12 LAB — URINALYSIS, COMPLETE (UACMP) WITH MICROSCOPIC
Bilirubin Urine: NEGATIVE
Glucose, UA: NEGATIVE mg/dL
Ketones, ur: NEGATIVE mg/dL
Leukocytes,Ua: NEGATIVE
Nitrite: NEGATIVE
Protein, ur: 100 mg/dL — AB
Specific Gravity, Urine: 1.012 (ref 1.005–1.030)
pH: 8 (ref 5.0–8.0)

## 2020-09-12 LAB — CBC
HCT: 24 % — ABNORMAL LOW (ref 36.0–46.0)
HCT: 25 % — ABNORMAL LOW (ref 36.0–46.0)
Hemoglobin: 8.1 g/dL — ABNORMAL LOW (ref 12.0–15.0)
Hemoglobin: 8.6 g/dL — ABNORMAL LOW (ref 12.0–15.0)
MCH: 32.7 pg (ref 26.0–34.0)
MCH: 33.1 pg (ref 26.0–34.0)
MCHC: 33.8 g/dL (ref 30.0–36.0)
MCHC: 34.4 g/dL (ref 30.0–36.0)
MCV: 96.8 fL (ref 80.0–100.0)
MCV: 96.2 fL (ref 80.0–100.0)
Platelets: 170 10*3/uL (ref 150–400)
Platelets: 190 10*3/uL (ref 150–400)
RBC: 2.48 MIL/uL — ABNORMAL LOW (ref 3.87–5.11)
RBC: 2.6 MIL/uL — ABNORMAL LOW (ref 3.87–5.11)
RDW: 15.7 % — ABNORMAL HIGH (ref 11.5–15.5)
RDW: 15.5 % (ref 11.5–15.5)
WBC: 5.2 10*3/uL (ref 4.0–10.5)
WBC: 5.8 10*3/uL (ref 4.0–10.5)
nRBC: 0 % (ref 0.0–0.2)
nRBC: 0 % (ref 0.0–0.2)

## 2020-09-12 LAB — BLOOD GAS, ARTERIAL
Acid-Base Excess: 6 mmol/L — ABNORMAL HIGH (ref 0.0–2.0)
Bicarbonate: 29.9 mmol/L — ABNORMAL HIGH (ref 20.0–28.0)
Drawn by: 38235
FIO2: 21
O2 Saturation: 97.4 %
Patient temperature: 37
pCO2 arterial: 43 mmHg (ref 32.0–48.0)
pH, Arterial: 7.457 — ABNORMAL HIGH (ref 7.350–7.450)
pO2, Arterial: 89.3 mmHg (ref 83.0–108.0)

## 2020-09-12 LAB — PULMONARY FUNCTION TEST
FEF 25-75 Pre: 1.41 L/sec
FEF2575-%Pred-Pre: 72 %
FEV1-%Pred-Pre: 57 %
FEV1-Pre: 1.11 L
FEV1FVC-%Pred-Pre: 109 %
FEV6-%Pred-Pre: 54 %
FEV6-Pre: 1.28 L
FEV6FVC-%Pred-Pre: 103 %
FVC-%Pred-Pre: 52 %
FVC-Pre: 1.28 L
Pre FEV1/FVC ratio: 87 %
Pre FEV6/FVC Ratio: 100 %

## 2020-09-12 LAB — RENAL FUNCTION PANEL
Albumin: 2.5 g/dL — ABNORMAL LOW (ref 3.5–5.0)
Anion gap: 9 (ref 5–15)
BUN: 7 mg/dL (ref 6–20)
CO2: 26 mmol/L (ref 22–32)
Calcium: 9.3 mg/dL (ref 8.9–10.3)
Chloride: 97 mmol/L — ABNORMAL LOW (ref 98–111)
Creatinine, Ser: 2.57 mg/dL — ABNORMAL HIGH (ref 0.44–1.00)
GFR, Estimated: 21 mL/min — ABNORMAL LOW (ref >60)
Glucose, Bld: 153 mg/dL — ABNORMAL HIGH (ref 70–99)
Phosphorus: 1.5 mg/dL — ABNORMAL LOW (ref 2.5–4.6)
Potassium: 3.4 mmol/L — ABNORMAL LOW (ref 3.5–5.1)
Sodium: 132 mmol/L — ABNORMAL LOW (ref 135–145)

## 2020-09-12 LAB — SURGICAL PCR SCREEN
MRSA, PCR: NEGATIVE
Staphylococcus aureus: NEGATIVE

## 2020-09-12 LAB — PREPARE RBC (CROSSMATCH)

## 2020-09-12 LAB — GLUCOSE, CAPILLARY
Glucose-Capillary: 108 mg/dL — ABNORMAL HIGH (ref 70–99)
Glucose-Capillary: 116 mg/dL — ABNORMAL HIGH (ref 70–99)
Glucose-Capillary: 143 mg/dL — ABNORMAL HIGH (ref 70–99)

## 2020-09-12 LAB — APTT: aPTT: 29 seconds (ref 24–36)

## 2020-09-12 MED ORDER — VANCOMYCIN HCL 1250 MG/250ML IV SOLN
1250.0000 mg | INTRAVENOUS | Status: AC
  Administered 2020-09-13: 1250 mg via INTRAVENOUS
  Filled 2020-09-12: qty 250

## 2020-09-12 MED ORDER — LIDOCAINE HCL (PF) 1 % IJ SOLN: 5.0000 mL | INTRAMUSCULAR | Status: DC | PRN

## 2020-09-12 MED ORDER — CEFAZOLIN SODIUM-DEXTROSE 2-4 GM/100ML-% IV SOLN
2.0000 g | INTRAVENOUS | Status: AC
  Administered 2020-09-13 (×2): 2 g via INTRAVENOUS
  Filled 2020-09-12: qty 100

## 2020-09-12 MED ORDER — MILRINONE LACTATE IN DEXTROSE 20-5 MG/100ML-% IV SOLN
0.3000 ug/kg/min | INTRAVENOUS | Status: DC
  Filled 2020-09-12: qty 100

## 2020-09-12 MED ORDER — TEMAZEPAM 15 MG PO CAPS: 15.0000 mg | ORAL_CAPSULE | Freq: Once | ORAL | Status: DC | PRN

## 2020-09-12 MED ORDER — NOREPINEPHRINE 4 MG/250ML-% IV SOLN
0.0000 ug/min | INTRAVENOUS | Status: DC
  Filled 2020-09-12: qty 250

## 2020-09-12 MED ORDER — MANNITOL 20 % IV SOLN
Freq: Once | INTRAVENOUS | Status: DC
  Filled 2020-09-12: qty 13

## 2020-09-12 MED ORDER — CHLORHEXIDINE GLUCONATE CLOTH 2 % EX PADS
6.0000 | MEDICATED_PAD | Freq: Once | CUTANEOUS | Status: AC
  Administered 2020-09-12: 6 via TOPICAL

## 2020-09-12 MED ORDER — SODIUM CHLORIDE 0.9 % IV SOLN
INTRAVENOUS | Status: DC
  Filled 2020-09-12: qty 30

## 2020-09-12 MED ORDER — TRANEXAMIC ACID (OHS) PUMP PRIME SOLUTION
2.0000 mg/kg | INTRAVENOUS | Status: DC
  Filled 2020-09-12: qty 1.27

## 2020-09-12 MED ORDER — TRANEXAMIC ACID (OHS) BOLUS VIA INFUSION
15.0000 mg/kg | INTRAVENOUS | Status: AC
  Administered 2020-09-13: 954 mg via INTRAVENOUS
  Filled 2020-09-12: qty 954

## 2020-09-12 MED ORDER — PHENYLEPHRINE HCL-NACL 20-0.9 MG/250ML-% IV SOLN
30.0000 ug/min | INTRAVENOUS | Status: DC
  Filled 2020-09-12: qty 250

## 2020-09-12 MED ORDER — NYSTATIN 100000 UNIT/ML MT SUSP
5.0000 mL | Freq: Four times a day (QID) | OROMUCOSAL | Status: DC
  Administered 2020-09-12 – 2020-09-21 (×27): 500000 [IU] via ORAL
  Filled 2020-09-12 (×21): qty 5

## 2020-09-12 MED ORDER — DIAZEPAM 2 MG PO TABS
2.0000 mg | ORAL_TABLET | Freq: Once | ORAL | Status: AC
  Administered 2020-09-13: 2 mg via ORAL
  Filled 2020-09-12: qty 1

## 2020-09-12 MED ORDER — CHLORHEXIDINE GLUCONATE CLOTH 2 % EX PADS
6.0000 | MEDICATED_PAD | Freq: Once | CUTANEOUS | Status: AC
  Administered 2020-09-13: 6 via TOPICAL

## 2020-09-12 MED ORDER — POTASSIUM CHLORIDE 2 MEQ/ML IV SOLN
80.0000 meq | INTRAVENOUS | Status: DC
  Filled 2020-09-12: qty 40

## 2020-09-12 MED ORDER — SODIUM CHLORIDE 0.9 % IV SOLN: 100.0000 mL | INTRAVENOUS | Status: DC | PRN

## 2020-09-12 MED ORDER — CEFAZOLIN SODIUM-DEXTROSE 2-4 GM/100ML-% IV SOLN
2.0000 g | INTRAVENOUS | Status: DC
  Filled 2020-09-12: qty 100

## 2020-09-12 MED ORDER — DARBEPOETIN ALFA 100 MCG/0.5ML IJ SOSY
100.0000 ug | PREFILLED_SYRINGE | INTRAMUSCULAR | Status: DC
  Filled 2020-09-12: qty 0.5

## 2020-09-12 MED ORDER — DEXMEDETOMIDINE HCL IN NACL 400 MCG/100ML IV SOLN
0.1000 ug/kg/h | INTRAVENOUS | Status: AC
  Administered 2020-09-13: .5 ug/kg/h via INTRAVENOUS
  Filled 2020-09-12: qty 100

## 2020-09-12 MED ORDER — BISACODYL 5 MG PO TBEC
5.0000 mg | DELAYED_RELEASE_TABLET | Freq: Once | ORAL | Status: AC
  Administered 2020-09-12: 5 mg via ORAL
  Filled 2020-09-12: qty 1

## 2020-09-12 MED ORDER — PLASMA-LYTE A IV SOLN
INTRAVENOUS | Status: DC
  Filled 2020-09-12: qty 5

## 2020-09-12 MED ORDER — EPINEPHRINE HCL 5 MG/250ML IV SOLN IN NS
0.0000 ug/min | INTRAVENOUS | Status: DC
  Filled 2020-09-12: qty 250

## 2020-09-12 MED ORDER — METOPROLOL TARTRATE 12.5 MG HALF TABLET
12.5000 mg | ORAL_TABLET | Freq: Once | ORAL | Status: AC
  Administered 2020-09-13: 12.5 mg via ORAL
  Filled 2020-09-12: qty 1

## 2020-09-12 MED ORDER — NITROGLYCERIN IN D5W 200-5 MCG/ML-% IV SOLN
2.0000 ug/min | INTRAVENOUS | Status: AC
  Administered 2020-09-13: 85 ug/min via INTRAVENOUS
  Filled 2020-09-12: qty 250

## 2020-09-12 MED ORDER — TRANEXAMIC ACID 1000 MG/10ML IV SOLN
1.5000 mg/kg/h | INTRAVENOUS | Status: AC
  Administered 2020-09-13: 1.5 mg/kg/h via INTRAVENOUS
  Filled 2020-09-12: qty 25

## 2020-09-12 MED ORDER — PENTAFLUOROPROP-TETRAFLUOROETH EX AERO: 1.0000 "application " | INHALATION_SPRAY | CUTANEOUS | Status: DC | PRN

## 2020-09-12 MED ORDER — LIDOCAINE-PRILOCAINE 2.5-2.5 % EX CREA
1.0000 "application " | TOPICAL_CREAM | CUTANEOUS | Status: DC | PRN
  Filled 2020-09-12: qty 5

## 2020-09-12 MED ORDER — CHLORHEXIDINE GLUCONATE 0.12 % MT SOLN
15.0000 mL | Freq: Once | OROMUCOSAL | Status: AC
  Administered 2020-09-13: 15 mL via OROMUCOSAL

## 2020-09-12 MED ORDER — INSULIN REGULAR(HUMAN) IN NACL 100-0.9 UT/100ML-% IV SOLN
INTRAVENOUS | Status: AC
  Administered 2020-09-13: .7 [IU]/h via INTRAVENOUS
  Filled 2020-09-12: qty 100

## 2020-09-12 NOTE — H&P (View-Only) (Signed)
2 Days Post-Op Procedure(s) (LRB): LEFT HEART CATH AND CORONARY ANGIOGRAPHY (N/A) Subjective: No complaints of chest pain or dyspnea. On NTG drip  Objective: Vital signs in last 24 hours: Temp:  [97.8 F (36.6 C)-98.4 F (36.9 C)] 98.4 F (36.9 C) (08/03 0717) Pulse Rate:  [71-73] 71 (08/03 0717) Cardiac Rhythm: Normal sinus rhythm (08/02 1900) Resp:  [12-18] 18 (08/03 0717) BP: (140-166)/(70-83) 166/75 (08/03 0717) SpO2:  [99 %-100 %] 99 % (08/03 0717) Weight:  [63.6 kg] 63.6 kg (08/03 0200)  Hemodynamic parameters for last 24 hours:    Intake/Output from previous day: 08/02 0701 - 08/03 0700 In: 460 [P.O.:460] Out: -  Intake/Output this shift: No intake/output data recorded.  General appearance: alert and cooperative Neurologic: intact Heart: regular rate and rhythm and systolic murmur RSB Lungs: clear to auscultation bilaterally  Lab Results: Recent Labs    09/11/20 0358 09/12/20 0228  WBC 5.9 5.2  HGB 7.8* 8.1*  HCT 23.7* 24.0*  PLT 175 170   BMET:  Recent Labs    09/11/20 0358 09/12/20 0228  NA 132* 129*  K 4.1 4.3  CL 93* 92*  CO2 28 26  GLUCOSE 120* 122*  BUN 19 22*  CREATININE 4.36* 5.59*  CALCIUM 10.5* 10.4*    PT/INR: No results for input(s): LABPROT, INR in the last 72 hours. ABG    Component Value Date/Time   PHART 7.454 (H) 05/17/2018 0344   HCO3 26.5 05/17/2018 0344   TCO2 28 05/17/2018 0344   ACIDBASEDEF 3.6 (H) 05/16/2018 0411   O2SAT 100.0 05/17/2018 0344   CBG (last 3)  Recent Labs    09/11/20 1611 09/11/20 2044 09/12/20 0602  GLUCAP 160* 124* 108*    Assessment/Plan:  Severe multi-vessel CAD. MRI of spine shows chronic degenerative changes but no signs of osteomyelitis or discitis. Carotid dopplers show no stenosis. Will plan to proceed with CABG tomorrow. She is having HD today.  LOS: 4 days    Gaye Pollack 09/12/2020

## 2020-09-12 NOTE — Progress Notes (Signed)
[facility] Progress Note   Subjective: Patient feels okay today denying any chest pain or shortness of breath  Objective Vitals:   09/12/20 0200 09/12/20 0339 09/12/20 0400 09/12/20 0717  BP: (!) 149/71 (!) 153/73 (!) 153/73 (!) 166/75  Pulse:  72  71  Resp: '17 17  18  '$ Temp:  97.8 F (36.6 C)  98.4 F (36.9 C)  TempSrc:  Oral  Oral  SpO2:  99%  99%  Weight: 63.6 kg     Height:       Physical Exam General: chronically ill appearing female in NAD Heart: Normal rate, systolic murmur present, no rub Lungs: Bilateral chest rise with no increased work of breathing Abdomen:NABS Extremities:No LE edema Dialysis Access: L AVF + T/B   Additional Objective Labs: Basic Metabolic Panel: Recent Labs  Lab 09/10/20 0251 09/11/20 0358 09/12/20 0228  NA 133* 132* 129*  K 3.5 4.1 4.3  CL 96* 93* 92*  CO2 '27 28 26  '$ GLUCOSE 198* 120* 122*  BUN 8 19 22*  CREATININE 2.71* 4.36* 5.59*  CALCIUM 9.9 10.5* 10.4*   Liver Function Tests: No results for input(s): AST, ALT, ALKPHOS, BILITOT, PROT, ALBUMIN in the last 168 hours. No results for input(s): LIPASE, AMYLASE in the last 168 hours. CBC: Recent Labs  Lab 09/08/20 1221 09/09/20 0300 09/09/20 1204 09/10/20 0251 09/11/20 0358 09/12/20 0228  WBC 7.1 5.2  --  4.1 5.9 5.2  NEUTROABS 4.7  --   --   --   --   --   HGB 9.2* 7.5*   < > 9.2* 7.8* 8.1*  HCT 28.1* 22.8*   < > 27.0* 23.7* 24.0*  MCV 101.1* 100.4*  --  96.1 97.5 96.8  PLT 243 171  --  188 175 170   < > = values in this interval not displayed.   Blood Culture    Component Value Date/Time   SDES BLOOD RIGHT WRIST 09/09/2020 1703   SPECREQUEST  09/09/2020 1703    BOTTLES DRAWN AEROBIC ONLY Blood Culture results may not be optimal due to an inadequate volume of blood received in culture bottles   CULT  09/09/2020 1703    NO GROWTH 3 DAYS Performed at [facility], [place] [address] [place], [place] [address]    REPTSTATUS PENDING 09/09/2020 1703     Cardiac Enzymes: No results for input(s): CKTOTAL, CKMB, CKMBINDEX, TROPONINI in the last 168 hours. CBG: Recent Labs  Lab 09/11/20 0601 09/11/20 1101 09/11/20 1611 09/11/20 2044 09/12/20 0602  GLUCAP 110* 111* 160* 124* 108*   Iron Studies: No results for input(s): IRON, TIBC, TRANSFERRIN, FERRITIN in the last 72 hours. '@lablastinr3'$ @ Studies/Results: MR LUMBAR SPINE WO CONTRAST  Result Date: 09/11/2020 CLINICAL DATA:  Low back pain, infection suspected. EXAM: MRI LUMBAR SPINE WITHOUT CONTRAST TECHNIQUE: Multiplanar, multisequence MR imaging of the lumbar spine was performed. No intravenous contrast was administered. COMPARISON:  11/15/2019 FINDINGS: Segmentation:  5 lumbar type vertebral bodies. Alignment: Straightening of the normal lumbar lordosis. Minimal scoliotic curvature. Vertebrae:  Minimal residual endplate T2 signal at [id number]. Conus medullaris and cauda equina: Conus extends to the L1 level. Conus and cauda equina appear normal. Paraspinal and other soft tissues: Negative other than multiple renal cysts. Disc levels: T11-12: Small superior endplate Schmorl's node. Minimal disc bulge. No stenosis. T12-L1: Normal L1-2: Mild bulging of the disc. Mild facet and ligamentous prominence. No compressive stenosis. L2-3: Endplate osteophytes and circumferential protrusion of the disc. Facet and ligamentous hypertrophy. Severe  multifactorial spinal stenosis likely to cause neural compression. Disc space and endplate changes do not suggest infection at this time. L3-4: Previous posterior decompression, diskectomy and fusion procedure. Pedicle screws and posterior rods. Fixed anterolisthesis of 3 mm at L3-4. Bulging of the disc. Mild canal narrowing but no likely neural compression. L4-5: Mild bulging of the disc. Mild facet and ligamentous prominence. Mild narrowing of the lateral recesses but without visible neural compression. L5-S1: Normal interspace. IMPRESSION: L2-3: Severe multifactorial  spinal stenosis. Endplate osteophytes and circumferential protrusion of the disc. Facet and ligamentous hypertrophy. Neural compression seems quite likely at this level. The appearance of the disc space and endplates does not suggest infection at this time. Degree of stenosis appears similar to the study of last October. L3-4: Previous posterior decompression, diskectomy and fusion. No change since last October. No likely compressive stenosis. Electronically Signed   By: Nelson Chimes M.D.   On: 09/11/2020 14:08   CARDIAC CATHETERIZATION  Result Date: 09/10/2020 Conclusions: Severe two-vessel coronary artery disease with heavily calcified disease of up to 80% involving the proximal and mid LAD as well as up to 90% involving the mid RCA. Mild-moderate, non-obstructive disease noted in the LCx territory. Mildly elevated left ventricular filling pressure (LVEDP 15-20 mmHg). Recommendations: Given severe two-vessel CAD with heavy, eccentric calcium (including in the proximal LAD) and the patient's history of diabetes, recommend cardiac surgery consultation for CABG. Improve blood pressure control and wean IV nitroglycerin, as tolerated. Nelva Bush, MD [facility]  VAS US DOPPLER PRE CABG  Result Date: 09/11/2020 PREOPERATIVE VASCULAR EVALUATION Patient Name:  Jodi Bell  Date of Exam:   09/11/2020 Medical Rec #: [id number]         Accession #:    [id number] Date of Birth: [birth year]          Patient Gender: F Patient Age:   60Y Exam Location:  [facility] Procedure:      VAS US DOPPLER PRE CABG Referring Phys: [place] --------------------------------------------------------------------------------  Indications:      Pre-CABG. Risk Factors:     Hypertension, Diabetes, coronary artery disease. Comparison Study: no prior Performing Technologist: Archie Patten RVS  Examination Guidelines: A complete evaluation includes B-mode imaging, spectral Doppler, color Doppler, and power Doppler as  needed of all accessible portions of each vessel. Bilateral testing is considered an integral part of a complete examination. Limited examinations for reoccurring indications may be performed as noted.  Right Carotid Findings: +----------+--------+--------+--------+------------+--------+           PSV cm/sEDV cm/sStenosisDescribe    Comments +----------+--------+--------+--------+------------+--------+ CCA Prox  78      11              heterogenous         +----------+--------+--------+--------+------------+--------+ CCA Distal70      11              heterogenous         +----------+--------+--------+--------+------------+--------+ ICA Prox  156     31      1-39%   heterogenoustortuous +----------+--------+--------+--------+------------+--------+ ICA Distal83      17                                   +----------+--------+--------+--------+------------+--------+ ECA       92                                           +----------+--------+--------+--------+------------+--------+ +----------+--------+-------+--------+------------+  PSV cm/sEDV cmsDescribeArm Pressure +----------+--------+-------+--------+------------+ Subclavian102                                 +----------+--------+-------+--------+------------+ +---------+--------+--+--------+-+---------+ VertebralPSV cm/s24EDV cm/s6Antegrade +---------+--------+--+--------+-+---------+ Left Carotid Findings: +----------+--------+--------+--------+------------+--------+           PSV cm/sEDV cm/sStenosisDescribe    Comments +----------+--------+--------+--------+------------+--------+ CCA Prox  109     15              heterogenous         +----------+--------+--------+--------+------------+--------+ CCA Distal59      13              heterogenous         +----------+--------+--------+--------+------------+--------+ ICA Prox  79      23      1-39%   heterogenous          +----------+--------+--------+--------+------------+--------+ ICA Distal62      15                                   +----------+--------+--------+--------+------------+--------+ ECA       128                                          +----------+--------+--------+--------+------------+--------+ +----------+--------+--------+--------+------------+ SubclavianPSV cm/sEDV cm/sDescribeArm Pressure +----------+--------+--------+--------+------------+           140     46                           +----------+--------+--------+--------+------------+ +---------+--------+--+--------+-+---------+ VertebralPSV cm/s36EDV cm/s8Antegrade +---------+--------+--+--------+-+---------+  ABI Findings: +--------+------------------+-----+---------+--------+ Right   Rt Pressure (mmHg)IndexWaveform Comment  +--------+------------------+-----+---------+--------+ Brachial                       triphasic         +--------+------------------+-----+---------+--------+ ATA                            triphasic         +--------+------------------+-----+---------+--------+ PTA                            triphasic         +--------+------------------+-----+---------+--------+ +--------+------------------+-----+---------+-------+ Left    Lt Pressure (mmHg)IndexWaveform Comment +--------+------------------+-----+---------+-------+ Brachial                       triphasicfistula +--------+------------------+-----+---------+-------+ ATA                            triphasic        +--------+------------------+-----+---------+-------+ PTA                            triphasic        +--------+------------------+-----+---------+-------+  Right Doppler Findings: +--------+--------+-----+---------+--------+ Site    PressureIndexDoppler  Comments +--------+--------+-----+---------+--------+ Brachial             triphasic          +--------+--------+-----+---------+--------+ Radial               triphasic         +--------+--------+-----+---------+--------+ Ulnar  triphasic         +--------+--------+-----+---------+--------+  Left Doppler Findings: +--------+--------+-----+---------+--------+ Site    PressureIndexDoppler  Comments +--------+--------+-----+---------+--------+ Brachial             triphasicfistula  +--------+--------+-----+---------+--------+ Radial               triphasic         +--------+--------+-----+---------+--------+ Ulnar                biphasic          +--------+--------+-----+---------+--------+  Summary: Right Carotid: Velocities in the right ICA are consistent with a 1-39% stenosis. Left Carotid: Velocities in the left ICA are consistent with a 1-39% stenosis. Vertebrals: Bilateral vertebral arteries demonstrate antegrade flow. Right Upper Extremity: Doppler waveforms decrease 50% with right radial compression. Doppler waveforms remain within normal limits with right ulnar compression. Left Upper Extremity: Unable to obtain Allens test- Fistula.  Electronically signed by Deitra Mayo MD on 09/11/2020 at 5:00:08 PM.    Final    Medications:  sodium chloride     sodium chloride     nitroGLYCERIN 85 mcg/min (09/12/20 0655)    sodium chloride   Intravenous Once   allopurinol  100 mg Oral QPM   amLODipine  10 mg Oral Daily   aspirin EC  81 mg Oral Daily   calcium acetate  2,001 mg Oral TID WC   chlorhexidine  15 mL Mouth Rinse BID   Chlorhexidine Gluconate Cloth  6 each Topical Q0600   cholecalciferol  1,000 Units Oral QPM   cinacalcet  30 mg Oral Q T,Th,Sa-HD   cloNIDine  0.3 mg Oral TID   ferrous sulfate  325 mg Oral Once per day on Mon Wed Fri   fluticasone  1 spray Each Nare Daily   folic acid  1 mg Oral Daily   furosemide  80 mg Oral BID   hydrALAZINE  100 mg Oral Q8H   insulin aspart  0-6 Units Subcutaneous TID WC   irbesartan  300 mg  Oral Daily   labetalol  400 mg Oral TID   linagliptin  5 mg Oral Daily   loratadine  10 mg Oral Daily   mouth rinse  15 mL Mouth Rinse q12n4p   montelukast  10 mg Oral Q supper   nicotine  7 mg Transdermal Daily   pantoprazole  40 mg Oral Daily   polyethylene glycol  17 g Oral Daily   polyvinyl alcohol  1 drop Both Eyes TID AC & HS   predniSONE  5 mg Oral Q breakfast   saccharomyces boulardii  250 mg Oral Daily   sodium chloride flush  3 mL Intravenous Q12H   sorbitol  30 mL Oral Once   Tofacitinib Citrate  5 mg Oral QHS   umeclidinium bromide  1 puff Inhalation Daily   zolpidem  5 mg Oral QHS      Home meds include phoslo 3 ac tid, norvasc, allopurinol, sensipar 30 tiw mwf hd, capares 0.3 tid, lasix 40, neurontin 300 hs prn/ hydralazine 100 tid, imdur 60, januvia, labetalol 400 tid, singulair, renavit, sl ntg prn, olmesartan 300 hs, oxy IR prn, protonix, prednisone 5 qd, spiriva respimat, venotolin hfa, xeljanz 5 hs, prn's     CXR - 7/30 diffuse IS edema  CXR - 7/31 improving but persistent IS edema    OP HD: MWF [facility]  3.5h  2 /2.5 bath  dry wt pend   Hep none   LUA AVF  400/700  na 134  K 4.4 CO2 29   BUN 18  Cr 3.8   Assessment/ Plan Resp distress/ acute hypoxic resp failure - due to vol overload most likely. May be losing body weight. Needs records from her OP unit. NSTEMI-Went for cardiac cath 09/11/20. 80% stenosis Prox LAD, 90% stenosis mid RCA. Mild AS/MR EF 60%. CT surgery (Dr. Cyndia Bent) consulted. CABG planned for 09/13/2020. Currently on NTG gtt .  ESRD - on HD MWF at [facility] [place] [facility].  Plan for HD today and then maintain MWF schedule thereafter HTN/Volume - severe HTN, on 5 BP lowering meds at home. HD 09/09/2020 into AM 09/10/20. Net UF 2 liters. Few crackles L base, no LE edema. No WOB at present. UF as tolerated tomorrow.  Anemia -hemoglobin 8.1.  FOBT positive but stool hard likely of minimal significance.  No transfusion today.  Continue to monitor  and transfuse as needed. Start aranesp 112mg on Friday COPD Gout PAD

## 2020-09-12 NOTE — Progress Notes (Addendum)
Progress Note    RAUSHANAH RAK  [id number] DOB: 02-Jan-1961  DOA: 09/08/2020 PCP: [name]., MD      Brief Narrative:    Medical records reviewed and are as summarized below:  [name] is a 60 y.o. female with medical history significant for ESRD on hemodialysis, PAD, COPD, RA on chronic steroids, CAD, PAD, tobacco use disorder, diabetes, hypertension on 5 blood pressure medications, who presented to the hospital with shortness of breath.      Assessment/Plan:   Active Problems:   Pulmonary edema   CHF (congestive heart failure) (HCC)   Non-ST elevation (NSTEMI) myocardial infarction (HCC)   Body mass index is 25.64 kg/m.   Acute on chronic diastolic CHF: She is on hemodialysis for volume management.  NSTEMI: Vascular lab.  Infusion but this has been discontinued.  Cardiac cath showed severe multivessel CAD.  Plan for CABG tomorrow.  Hypertension, hypertensive emergency: BP improved.  Continue nitroglycerin infusion and antihypertensives  ESRD: Follow-up with nephrologist for hemodialysis.  Acute hypoxic respiratory failure: Resolved.  She is tolerating room air.  Severe multifactorial spinal stenosis at L2-3.  Endplate osteophytes and circumferential protrusion of the disc (MRI lumbar spine.  No evidence of discitis or osteomyelitis on MRI lumbar spine.  Positive heme stools: Hemoglobin is stable.  Hyponatremia: Asymptomatic.  Monitor BMP.  Other comorbidities include type II DM, COPD, anemia of chronic disease.  Diet Order             Diet renal with fluid restriction Fluid restriction: 1200 mL Fluid; Room service appropriate? Yes; Fluid consistency: Thin  Diet effective now                      Consultants: Cardiologist Cardiothoracic surgeon Nephrologist  Procedures: Cardiac cath    Medications:    sodium chloride   Intravenous Once   allopurinol  100 mg Oral QPM   amLODipine  10 mg Oral Daily   aspirin EC  81  mg Oral Daily   calcium acetate  2,001 mg Oral TID WC   chlorhexidine  15 mL Mouth Rinse BID   Chlorhexidine Gluconate Cloth  6 each Topical Q0600   cholecalciferol  1,000 Units Oral QPM   cinacalcet  30 mg Oral Q T,Th,Sa-HD   cloNIDine  0.3 mg Oral TID   ferrous sulfate  325 mg Oral Once per day on Mon Wed Fri   fluticasone  1 spray Each Nare Daily   folic acid  1 mg Oral Daily   furosemide  80 mg Oral BID   hydrALAZINE  100 mg Oral Q8H   insulin aspart  0-6 Units Subcutaneous TID WC   irbesartan  300 mg Oral Daily   labetalol  400 mg Oral TID   linagliptin  5 mg Oral Daily   loratadine  10 mg Oral Daily   mouth rinse  15 mL Mouth Rinse q12n4p   montelukast  10 mg Oral Q supper   nicotine  7 mg Transdermal Daily   pantoprazole  40 mg Oral Daily   polyethylene glycol  17 g Oral Daily   polyvinyl alcohol  1 drop Both Eyes TID AC & HS   predniSONE  5 mg Oral Q breakfast   saccharomyces boulardii  250 mg Oral Daily   sodium chloride flush  3 mL Intravenous Q12H   sorbitol  30 mL Oral Once   Tofacitinib Citrate  5 mg Oral QHS   umeclidinium  bromide  1 puff Inhalation Daily   zolpidem  5 mg Oral QHS   Continuous Infusions:  sodium chloride     sodium chloride     nitroGLYCERIN 85 mcg/min (09/12/20 0655)     Anti-infectives (From admission, onward)    None              Family Communication/Anticipated D/C date and plan/Code Status   DVT prophylaxis:      Code Status: Full Code  Family Communication: None Disposition Plan:    Status is: Inpatient  Remains inpatient appropriate because:Ongoing diagnostic testing needed not appropriate for outpatient work up and IV treatments appropriate due to intensity of illness or inability to take PO  Dispo: The patient is from: Home              Anticipated d/c is to: Home              Patient currently is not medically stable to d/c.   Difficult to place patient No           Subjective:   No chest pain  or shortness of breath  Objective:    Vitals:   09/12/20 0200 09/12/20 0339 09/12/20 0400 09/12/20 0717  BP: (!) 149/71 (!) 153/73 (!) 153/73 (!) 166/75  Pulse:  72  71  Resp: '17 17  18  '$ Temp:  97.8 F (36.6 C)  98.4 F (36.9 C)  TempSrc:  Oral  Oral  SpO2:  99%  99%  Weight: 63.6 kg     Height:       No data found.   Intake/Output Summary (Last 24 hours) at 09/12/2020 0923 Last data filed at 09/11/2020 2000 Gross per 24 hour  Intake 340 ml  Output --  Net 340 ml   Filed Weights   09/11/20 0046 09/11/20 0500 09/12/20 0200  Weight: 64 kg 64 kg 63.6 kg    Exam:  GEN: NAD SKIN: Warm and dry EYES: No pallor or icterus ENT: MMM CV: RRR PULM: CTA B ABD: soft, ND, NT, +BS CNS: AAO x 3, non focal EXT: No edema or tenderness. Left arm bruit         Data Reviewed:   I have personally reviewed following labs and imaging studies:  Labs: Labs show the following:   Basic Metabolic Panel: Recent Labs  Lab 09/08/20 1221 09/09/20 0300 09/10/20 0251 09/11/20 0358 09/12/20 0228  NA 134* 135 133* 132* 129*  K 4.4 3.8 3.5 4.1 4.3  CL 97* 97* 96* 93* 92*  CO2 '29 31 27 28 26  '$ GLUCOSE 103* 82 198* 120* 122*  BUN '18 10 8 19 '$ 22*  CREATININE 3.86* 3.14* 2.71* 4.36* 5.59*  CALCIUM 10.3 9.2 9.9 10.5* 10.4*  MG 1.9  --   --   --   --    GFR Estimated Creatinine Clearance: 9.4 mL/min (A) (by C-G formula based on SCr of 5.59 mg/dL (H)). Liver Function Tests: No results for input(s): AST, ALT, ALKPHOS, BILITOT, PROT, ALBUMIN in the last 168 hours. No results for input(s): LIPASE, AMYLASE in the last 168 hours. No results for input(s): AMMONIA in the last 168 hours. Coagulation profile Recent Labs  Lab 09/08/20 1800  INR 1.1    CBC: Recent Labs  Lab 09/08/20 1221 09/09/20 0300 09/09/20 1204 09/10/20 0251 09/11/20 0358 09/12/20 0228  WBC 7.1 5.2  --  4.1 5.9 5.2  NEUTROABS 4.7  --   --   --   --   --  HGB 9.2* 7.5* 8.2* 9.2* 7.8* 8.1*  HCT 28.1* 22.8*  24.3* 27.0* 23.7* 24.0*  MCV 101.1* 100.4*  --  96.1 97.5 96.8  PLT 243 171  --  188 175 170   Cardiac Enzymes: No results for input(s): CKTOTAL, CKMB, CKMBINDEX, TROPONINI in the last 168 hours. BNP (last 3 results) No results for input(s): PROBNP in the last 8760 hours. CBG: Recent Labs  Lab 09/11/20 0601 09/11/20 1101 09/11/20 1611 09/11/20 2044 09/12/20 0602  GLUCAP 110* 111* 160* 124* 108*   D-Dimer: No results for input(s): DDIMER in the last 72 hours. Hgb A1c: No results for input(s): HGBA1C in the last 72 hours. Lipid Profile: Recent Labs    09/10/20 0251  CHOL 193  HDL 69  LDLCALC 112*  TRIG 60  CHOLHDL 2.8   Thyroid function studies: No results for input(s): TSH, T4TOTAL, T3FREE, THYROIDAB in the last 72 hours.  Invalid input(s): FREET3 Anemia work up: No results for input(s): VITAMINB12, FOLATE, FERRITIN, TIBC, IRON, RETICCTPCT in the last 72 hours. Sepsis Labs: Recent Labs  Lab 09/09/20 0300 09/10/20 0251 09/11/20 0358 09/12/20 0228  WBC 5.2 4.1 5.9 5.2    Microbiology Recent Results (from the past 240 hour(s))  Resp Panel by RT-PCR (Flu A&B, Covid) Nasopharyngeal Swab     Status: None   Collection Time: 09/08/20  1:21 PM   Specimen: Nasopharyngeal Swab; Nasopharyngeal(NP) swabs in vial transport medium  Result Value Ref Range Status   SARS Coronavirus 2 by RT PCR NEGATIVE NEGATIVE Final    Comment: (NOTE) SARS-CoV-2 target nucleic acids are NOT DETECTED.  The SARS-CoV-2 RNA is generally detectable in upper respiratory specimens during the acute phase of infection. The lowest concentration of SARS-CoV-2 viral copies this assay can detect is 138 copies/mL. A negative result does not preclude SARS-Cov-2 infection and should not be used as the sole basis for treatment or other patient management decisions. A negative result may occur with  improper specimen collection/handling, submission of specimen other than nasopharyngeal swab, presence of  viral mutation(s) within the areas targeted by this assay, and inadequate number of viral copies(<138 copies/mL). A negative result must be combined with clinical observations, patient history, and epidemiological information. The expected result is Negative.  Fact Sheet for Patients:  EntrepreneurPulse.com.au  Fact Sheet for Healthcare Providers:  IncredibleEmployment.be  This test is no t yet approved or cleared by the [place] FDA and  has been authorized for detection and/or diagnosis of SARS-CoV-2 by FDA under an Emergency Use Authorization (EUA). This EUA will remain  in effect (meaning this test can be used) for the duration of the COVID-19 declaration under Section 564(b)(1) of the Act, 21 U.S.C.section 360bbb-3(b)(1), unless the authorization is terminated  or revoked sooner.       Influenza A by PCR NEGATIVE NEGATIVE Final   Influenza B by PCR NEGATIVE NEGATIVE Final    Comment: (NOTE) The Xpert Xpress SARS-CoV-2/FLU/RSV plus assay is intended as an aid in the diagnosis of influenza from Nasopharyngeal swab specimens and should not be used as a sole basis for treatment. Nasal washings and aspirates are unacceptable for Xpert Xpress SARS-CoV-2/FLU/RSV testing.  Fact Sheet for Patients: EntrepreneurPulse.com.au  Fact Sheet for Healthcare Providers: IncredibleEmployment.be  This test is not yet approved or cleared by the [place] FDA and has been authorized for detection and/or diagnosis of SARS-CoV-2 by FDA under an Emergency Use Authorization (EUA). This EUA will remain in effect (meaning this test can be used) for the duration of  the COVID-19 declaration under Section 564(b)(1) of the Act, 21 U.S.C. section 360bbb-3(b)(1), unless the authorization is terminated or revoked.  Performed at [facility], [address] [place], [place] [address]   Culture, blood (routine x  2)     Status: None (Preliminary result)   Collection Time: 09/09/20  4:52 PM   Specimen: BLOOD LEFT WRIST  Result Value Ref Range Status   Specimen Description BLOOD LEFT WRIST  Final   Special Requests   Final    BOTTLES DRAWN AEROBIC ONLY Blood Culture results may not be optimal due to an inadequate volume of blood received in culture bottles   Culture   Final    NO GROWTH 3 DAYS Performed at [facility], [place] [address], [place], [place] [address]    Report Status PENDING  Incomplete  Culture, blood (routine x 2)     Status: None (Preliminary result)   Collection Time: 09/09/20  5:03 PM   Specimen: BLOOD RIGHT WRIST  Result Value Ref Range Status   Specimen Description BLOOD RIGHT WRIST  Final   Special Requests   Final    BOTTLES DRAWN AEROBIC ONLY Blood Culture results may not be optimal due to an inadequate volume of blood received in culture bottles   Culture   Final    NO GROWTH 3 DAYS Performed at [facility], [place] [address], [place], [place] [address]    Report Status PENDING  Incomplete    Procedures and diagnostic studies:  MR LUMBAR SPINE WO CONTRAST  Result Date: 09/11/2020 CLINICAL DATA:  Low back pain, infection suspected. EXAM: MRI LUMBAR SPINE WITHOUT CONTRAST TECHNIQUE: Multiplanar, multisequence MR imaging of the lumbar spine was performed. No intravenous contrast was administered. COMPARISON:  11/15/2019 FINDINGS: Segmentation:  5 lumbar type vertebral bodies. Alignment: Straightening of the normal lumbar lordosis. Minimal scoliotic curvature. Vertebrae:  Minimal residual endplate T2 signal at [id number]. Conus medullaris and cauda equina: Conus extends to the L1 level. Conus and cauda equina appear normal. Paraspinal and other soft tissues: Negative other than multiple renal cysts. Disc levels: T11-12: Small superior endplate Schmorl's node. Minimal disc bulge. No stenosis. T12-L1: Normal L1-2: Mild bulging of the disc. Mild facet and ligamentous prominence.  No compressive stenosis. L2-3: Endplate osteophytes and circumferential protrusion of the disc. Facet and ligamentous hypertrophy. Severe multifactorial spinal stenosis likely to cause neural compression. Disc space and endplate changes do not suggest infection at this time. L3-4: Previous posterior decompression, diskectomy and fusion procedure. Pedicle screws and posterior rods. Fixed anterolisthesis of 3 mm at L3-4. Bulging of the disc. Mild canal narrowing but no likely neural compression. L4-5: Mild bulging of the disc. Mild facet and ligamentous prominence. Mild narrowing of the lateral recesses but without visible neural compression. L5-S1: Normal interspace. IMPRESSION: L2-3: Severe multifactorial spinal stenosis. Endplate osteophytes and circumferential protrusion of the disc. Facet and ligamentous hypertrophy. Neural compression seems quite likely at this level. The appearance of the disc space and endplates does not suggest infection at this time. Degree of stenosis appears similar to the study of last October. L3-4: Previous posterior decompression, diskectomy and fusion. No change since last October. No likely compressive stenosis. Electronically Signed   By: Nelson Chimes M.D.   On: 09/11/2020 14:08   CARDIAC CATHETERIZATION  Result Date: 09/10/2020 Conclusions: Severe two-vessel coronary artery disease with heavily calcified disease of up to 80% involving the proximal and mid LAD as well as up to 90% involving the mid  RCA. Mild-moderate, non-obstructive disease noted in the LCx territory. Mildly elevated left ventricular filling pressure (LVEDP 15-20 mmHg). Recommendations: Given severe two-vessel CAD with heavy, eccentric calcium (including in the proximal LAD) and the patient's history of diabetes, recommend cardiac surgery consultation for CABG. Improve blood pressure control and wean IV nitroglycerin, as tolerated. Nelva Bush, MD [facility]  VAS US DOPPLER PRE CABG  Result Date:  09/11/2020 PREOPERATIVE VASCULAR EVALUATION Patient Name:  Jodi Bell  Date of Exam:   09/11/2020 Medical Rec #: [id number]         Accession #:    [id number] Date of Birth: 06-23-1960          Patient Gender: F Patient Age:   60Y Exam Location:  [facility] Procedure:      VAS US DOPPLER PRE CABG Referring Phys: Bradford --------------------------------------------------------------------------------  Indications:      Pre-CABG. Risk Factors:     Hypertension, Diabetes, coronary artery disease. Comparison Study: no prior Performing Technologist: Archie Patten RVS  Examination Guidelines: A complete evaluation includes B-mode imaging, spectral Doppler, color Doppler, and power Doppler as needed of all accessible portions of each vessel. Bilateral testing is considered an integral part of a complete examination. Limited examinations for reoccurring indications may be performed as noted.  Right Carotid Findings: +----------+--------+--------+--------+------------+--------+           PSV cm/sEDV cm/sStenosisDescribe    Comments +----------+--------+--------+--------+------------+--------+ CCA Prox  78      11              heterogenous         +----------+--------+--------+--------+------------+--------+ CCA Distal70      11              heterogenous         +----------+--------+--------+--------+------------+--------+ ICA Prox  156     31      1-39%   heterogenoustortuous +----------+--------+--------+--------+------------+--------+ ICA Distal83      17                                   +----------+--------+--------+--------+------------+--------+ ECA       92                                           +----------+--------+--------+--------+------------+--------+ +----------+--------+-------+--------+------------+           PSV cm/sEDV cmsDescribeArm Pressure +----------+--------+-------+--------+------------+ Subclavian102                                  +----------+--------+-------+--------+------------+ +---------+--------+--+--------+-+---------+ VertebralPSV cm/s24EDV cm/s6Antegrade +---------+--------+--+--------+-+---------+ Left Carotid Findings: +----------+--------+--------+--------+------------+--------+           PSV cm/sEDV cm/sStenosisDescribe    Comments +----------+--------+--------+--------+------------+--------+ CCA Prox  109     15              heterogenous         +----------+--------+--------+--------+------------+--------+ CCA Distal59      13              heterogenous         +----------+--------+--------+--------+------------+--------+ ICA Prox  79      23      1-39%   heterogenous         +----------+--------+--------+--------+------------+--------+ ICA Distal62      15                                   +----------+--------+--------+--------+------------+--------+  ECA       128                                          +----------+--------+--------+--------+------------+--------+ +----------+--------+--------+--------+------------+ SubclavianPSV cm/sEDV cm/sDescribeArm Pressure +----------+--------+--------+--------+------------+           140     46                           +----------+--------+--------+--------+------------+ +---------+--------+--+--------+-+---------+ VertebralPSV cm/s36EDV cm/s8Antegrade +---------+--------+--+--------+-+---------+  ABI Findings: +--------+------------------+-----+---------+--------+ Right   Rt Pressure (mmHg)IndexWaveform Comment  +--------+------------------+-----+---------+--------+ Brachial                       triphasic         +--------+------------------+-----+---------+--------+ ATA                            triphasic         +--------+------------------+-----+---------+--------+ PTA                            triphasic         +--------+------------------+-----+---------+--------+  +--------+------------------+-----+---------+-------+ Left    Lt Pressure (mmHg)IndexWaveform Comment +--------+------------------+-----+---------+-------+ Brachial                       triphasicfistula +--------+------------------+-----+---------+-------+ ATA                            triphasic        +--------+------------------+-----+---------+-------+ PTA                            triphasic        +--------+------------------+-----+---------+-------+  Right Doppler Findings: +--------+--------+-----+---------+--------+ Site    PressureIndexDoppler  Comments +--------+--------+-----+---------+--------+ Brachial             triphasic         +--------+--------+-----+---------+--------+ Radial               triphasic         +--------+--------+-----+---------+--------+ Ulnar                triphasic         +--------+--------+-----+---------+--------+  Left Doppler Findings: +--------+--------+-----+---------+--------+ Site    PressureIndexDoppler  Comments +--------+--------+-----+---------+--------+ Brachial             triphasicfistula  +--------+--------+-----+---------+--------+ Radial               triphasic         +--------+--------+-----+---------+--------+ Ulnar                biphasic          +--------+--------+-----+---------+--------+  Summary: Right Carotid: Velocities in the right ICA are consistent with a 1-39% stenosis. Left Carotid: Velocities in the left ICA are consistent with a 1-39% stenosis. Vertebrals: Bilateral vertebral arteries demonstrate antegrade flow. Right Upper Extremity: Doppler waveforms decrease 50% with right radial compression. Doppler waveforms remain within normal limits with right ulnar compression. Left Upper Extremity: Unable to obtain Allens test- Fistula.  Electronically signed by Deitra Mayo MD on 09/11/2020 at 5:00:08 PM.    Final  LOS: 4 days   Sachi Boulay  [facility]   Pager on [email or web address]. If 7PM-7AM, please contact night-coverage at [email or web address]     09/12/2020, 9:23 AM

## 2020-09-12 NOTE — Progress Notes (Signed)
Progress Note  Patient Name: Jodi Bell Date of Encounter: 09/12/2020  Primary Cardiologist:   None   Subjective   No chest pain or SOB.   Her biggest complaint is constipation.   Inpatient Medications    Scheduled Meds:  sodium chloride   Intravenous Once   allopurinol  100 mg Oral QPM   amLODipine  10 mg Oral Daily   aspirin EC  81 mg Oral Daily   calcium acetate  2,001 mg Oral TID WC   chlorhexidine  15 mL Mouth Rinse BID   Chlorhexidine Gluconate Cloth  6 each Topical Q0600   cholecalciferol  1,000 Units Oral QPM   cinacalcet  30 mg Oral Q T,Th,Sa-HD   cloNIDine  0.3 mg Oral TID   [START ON 09/14/2020] darbepoetin (ARANESP) injection - DIALYSIS  100 mcg Intravenous Q Fri-HD   ferrous sulfate  325 mg Oral Once per day on Mon Wed Fri   fluticasone  1 spray Each Nare Daily   folic acid  1 mg Oral Daily   furosemide  80 mg Oral BID   hydrALAZINE  100 mg Oral Q8H   insulin aspart  0-6 Units Subcutaneous TID WC   irbesartan  300 mg Oral Daily   labetalol  400 mg Oral TID   linagliptin  5 mg Oral Daily   loratadine  10 mg Oral Daily   mouth rinse  15 mL Mouth Rinse q12n4p   montelukast  10 mg Oral Q supper   nicotine  7 mg Transdermal Daily   pantoprazole  40 mg Oral Daily   polyethylene glycol  17 g Oral Daily   polyvinyl alcohol  1 drop Both Eyes TID AC & HS   predniSONE  5 mg Oral Q breakfast   saccharomyces boulardii  250 mg Oral Daily   sodium chloride flush  3 mL Intravenous Q12H   sorbitol  30 mL Oral Once   Tofacitinib Citrate  5 mg Oral QHS   umeclidinium bromide  1 puff Inhalation Daily   zolpidem  5 mg Oral QHS   Continuous Infusions:  sodium chloride     sodium chloride     nitroGLYCERIN 85 mcg/min (09/12/20 0655)   PRN Meds: sodium chloride, sodium chloride, acetaminophen, albuterol, cyclobenzaprine, gabapentin, LORazepam, ondansetron (ZOFRAN) IV, oxyCODONE, sodium chloride flush   Vital Signs    Vitals:   09/12/20 0200 09/12/20 0339  09/12/20 0400 09/12/20 0717  BP: (!) 149/71 (!) 153/73 (!) 153/73 (!) 166/75  Pulse:  72  71  Resp: '17 17  18  '$ Temp:  97.8 F (36.6 C)  98.4 F (36.9 C)  TempSrc:  Oral  Oral  SpO2:  99%  99%  Weight: 63.6 kg     Height:        Intake/Output Summary (Last 24 hours) at 09/12/2020 1005 Last data filed at 09/11/2020 2000 Gross per 24 hour  Intake 340 ml  Output --  Net 340 ml   Filed Weights   09/11/20 0046 09/11/20 0500 09/12/20 0200  Weight: 64 kg 64 kg 63.6 kg    Telemetry    NSR  - Personally Reviewed  ECG    NA - Personally Reviewed  Physical Exam   GEN: No  acute distress.   Neck: No  JVD Cardiac: RRR, 3/6 apical systolic murmur, no diastolic murmurs, rubs, or gallops.  Respiratory: Clear   to auscultation bilaterally. GI: Soft, nontender, non-distended, normal bowel sounds  MS:  No edema; No deformity.  Neuro:   Nonfocal  Psych: Oriented and appropriate     Labs    Chemistry Recent Labs  Lab 09/10/20 0251 09/11/20 0358 09/12/20 0228  NA 133* 132* 129*  K 3.5 4.1 4.3  CL 96* 93* 92*  CO2 '27 28 26  '$ GLUCOSE 198* 120* 122*  BUN 8 19 22*  CREATININE 2.71* 4.36* 5.59*  CALCIUM 9.9 10.5* 10.4*  GFRNONAA 19* 11* 8*  ANIONGAP '10 11 11     '$ Hematology Recent Labs  Lab 09/10/20 0251 09/11/20 0358 09/12/20 0228  WBC 4.1 5.9 5.2  RBC 2.81* 2.43* 2.48*  HGB 9.2* 7.8* 8.1*  HCT 27.0* 23.7* 24.0*  MCV 96.1 97.5 96.8  MCH 32.7 32.1 32.7  MCHC 34.1 32.9 33.8  RDW 15.5 15.5 15.7*  PLT 188 175 170    Cardiac EnzymesNo results for input(s): TROPONINI in the last 168 hours. No results for input(s): TROPIPOC in the last 168 hours.   BNPNo results for input(s): BNP, PROBNP in the last 168 hours.   DDimer No results for input(s): DDIMER in the last 168 hours.   Radiology    MR LUMBAR SPINE WO CONTRAST  Result Date: 09/11/2020 CLINICAL DATA:  Low back pain, infection suspected. EXAM: MRI LUMBAR SPINE WITHOUT CONTRAST TECHNIQUE: Multiplanar,  multisequence MR imaging of the lumbar spine was performed. No intravenous contrast was administered. COMPARISON:  11/15/2019 FINDINGS: Segmentation:  5 lumbar type vertebral bodies. Alignment: Straightening of the normal lumbar lordosis. Minimal scoliotic curvature. Vertebrae:  Minimal residual endplate T2 signal at [id number]. Conus medullaris and cauda equina: Conus extends to the L1 level. Conus and cauda equina appear normal. Paraspinal and other soft tissues: Negative other than multiple renal cysts. Disc levels: T11-12: Small superior endplate Schmorl's node. Minimal disc bulge. No stenosis. T12-L1: Normal L1-2: Mild bulging of the disc. Mild facet and ligamentous prominence. No compressive stenosis. L2-3: Endplate osteophytes and circumferential protrusion of the disc. Facet and ligamentous hypertrophy. Severe multifactorial spinal stenosis likely to cause neural compression. Disc space and endplate changes do not suggest infection at this time. L3-4: Previous posterior decompression, diskectomy and fusion procedure. Pedicle screws and posterior rods. Fixed anterolisthesis of 3 mm at L3-4. Bulging of the disc. Mild canal narrowing but no likely neural compression. L4-5: Mild bulging of the disc. Mild facet and ligamentous prominence. Mild narrowing of the lateral recesses but without visible neural compression. L5-S1: Normal interspace. IMPRESSION: L2-3: Severe multifactorial spinal stenosis. Endplate osteophytes and circumferential protrusion of the disc. Facet and ligamentous hypertrophy. Neural compression seems quite likely at this level. The appearance of the disc space and endplates does not suggest infection at this time. Degree of stenosis appears similar to the study of last October. L3-4: Previous posterior decompression, diskectomy and fusion. No change since last October. No likely compressive stenosis. Electronically Signed   By: Nelson Chimes M.D.   On: 09/11/2020 14:08   CARDIAC  CATHETERIZATION  Result Date: 09/10/2020 Conclusions: Severe two-vessel coronary artery disease with heavily calcified disease of up to 80% involving the proximal and mid LAD as well as up to 90% involving the mid RCA. Mild-moderate, non-obstructive disease noted in the LCx territory. Mildly elevated left ventricular filling pressure (LVEDP 15-20 mmHg). Recommendations: Given severe two-vessel CAD with heavy, eccentric calcium (including in the proximal LAD) and the patient's history of diabetes, recommend cardiac surgery consultation for CABG. Improve blood pressure control and wean IV nitroglycerin, as tolerated. Nelva Bush, MD [facility]  VAS US DOPPLER PRE CABG  Result Date:  09/11/2020 PREOPERATIVE VASCULAR EVALUATION Patient Name:  SHATANA QUIAMBAO  Date of Exam:   09/11/2020 Medical Rec #: [id number]         Accession #:    [id number] Date of Birth: 1960/05/07          Patient Gender: F Patient Age:   60Y Exam Location:  [facility] Procedure:      VAS US DOPPLER PRE CABG Referring Phys: Sargent --------------------------------------------------------------------------------  Indications:      Pre-CABG. Risk Factors:     Hypertension, Diabetes, coronary artery disease. Comparison Study: no prior Performing Technologist: Archie Patten RVS  Examination Guidelines: A complete evaluation includes B-mode imaging, spectral Doppler, color Doppler, and power Doppler as needed of all accessible portions of each vessel. Bilateral testing is considered an integral part of a complete examination. Limited examinations for reoccurring indications may be performed as noted.  Right Carotid Findings: +----------+--------+--------+--------+------------+--------+           PSV cm/sEDV cm/sStenosisDescribe    Comments +----------+--------+--------+--------+------------+--------+ CCA Prox  78      11              heterogenous          +----------+--------+--------+--------+------------+--------+ CCA Distal70      11              heterogenous         +----------+--------+--------+--------+------------+--------+ ICA Prox  156     31      1-39%   heterogenoustortuous +----------+--------+--------+--------+------------+--------+ ICA Distal83      17                                   +----------+--------+--------+--------+------------+--------+ ECA       92                                           +----------+--------+--------+--------+------------+--------+ +----------+--------+-------+--------+------------+           PSV cm/sEDV cmsDescribeArm Pressure +----------+--------+-------+--------+------------+ Subclavian102                                 +----------+--------+-------+--------+------------+ +---------+--------+--+--------+-+---------+ VertebralPSV cm/s24EDV cm/s6Antegrade +---------+--------+--+--------+-+---------+ Left Carotid Findings: +----------+--------+--------+--------+------------+--------+           PSV cm/sEDV cm/sStenosisDescribe    Comments +----------+--------+--------+--------+------------+--------+ CCA Prox  109     15              heterogenous         +----------+--------+--------+--------+------------+--------+ CCA Distal59      13              heterogenous         +----------+--------+--------+--------+------------+--------+ ICA Prox  79      23      1-39%   heterogenous         +----------+--------+--------+--------+------------+--------+ ICA Distal62      15                                   +----------+--------+--------+--------+------------+--------+ ECA       128                                          +----------+--------+--------+--------+------------+--------+ +----------+--------+--------+--------+------------+  SubclavianPSV cm/sEDV cm/sDescribeArm Pressure +----------+--------+--------+--------+------------+            140     46                           +----------+--------+--------+--------+------------+ +---------+--------+--+--------+-+---------+ VertebralPSV cm/s36EDV cm/s8Antegrade +---------+--------+--+--------+-+---------+  ABI Findings: +--------+------------------+-----+---------+--------+ Right   Rt Pressure (mmHg)IndexWaveform Comment  +--------+------------------+-----+---------+--------+ Brachial                       triphasic         +--------+------------------+-----+---------+--------+ ATA                            triphasic         +--------+------------------+-----+---------+--------+ PTA                            triphasic         +--------+------------------+-----+---------+--------+ +--------+------------------+-----+---------+-------+ Left    Lt Pressure (mmHg)IndexWaveform Comment +--------+------------------+-----+---------+-------+ Brachial                       triphasicfistula +--------+------------------+-----+---------+-------+ ATA                            triphasic        +--------+------------------+-----+---------+-------+ PTA                            triphasic        +--------+------------------+-----+---------+-------+  Right Doppler Findings: +--------+--------+-----+---------+--------+ Site    PressureIndexDoppler  Comments +--------+--------+-----+---------+--------+ Brachial             triphasic         +--------+--------+-----+---------+--------+ Radial               triphasic         +--------+--------+-----+---------+--------+ Ulnar                triphasic         +--------+--------+-----+---------+--------+  Left Doppler Findings: +--------+--------+-----+---------+--------+ Site    PressureIndexDoppler  Comments +--------+--------+-----+---------+--------+ Brachial             triphasicfistula  +--------+--------+-----+---------+--------+ Radial               triphasic          +--------+--------+-----+---------+--------+ Ulnar                biphasic          +--------+--------+-----+---------+--------+  Summary: Right Carotid: Velocities in the right ICA are consistent with a 1-39% stenosis. Left Carotid: Velocities in the left ICA are consistent with a 1-39% stenosis. Vertebrals: Bilateral vertebral arteries demonstrate antegrade flow. Right Upper Extremity: Doppler waveforms decrease 50% with right radial compression. Doppler waveforms remain within normal limits with right ulnar compression. Left Upper Extremity: Unable to obtain Allens test- Fistula.  Electronically signed by Deitra Mayo MD on 09/11/2020 at 5:00:08 PM.    Final     Cardiac Studies   Echo:  1. Left ventricular ejection fraction, by estimation, is 60%. The left  ventricle has normal function. The left ventricle are septal wall motion  abnormalities described below. There is mild concentric left ventricular  hypertrophy with severe hypetrophy  of the basal-septal segment. Left ventricular diastolic parameters are  indeterminate in the setting of significant  mitral annular calcification  but suggestive of increase left atrial pressure.   2. Right ventricular systolic function is normal. The right ventricular  size is normal. There is normal pulmonary artery systolic pressure.   3. A small pericardial effusion is present. The pericardial effusion is  circumferential.   4. The mitral valve is degenerative. Mild mitral valve regurgitation.  Severe mitral annular calcification.   5. The aortic valve is tricuspid. There is mild calcification of the  aortic valve. There is moderate thickening of the aortic valve. Aortic  valve regurgitation is not visualized. Mild aortic valve stenosis. Aortic  valve mean gradient measures 24.0 mmHg.   Aortic valve Vmax measures 3.26 m/s.   6. Left atrial size was severely dilated.    Diagnostic cath Dominance: Right        Patient Profile      60 y.o. 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 Tyrell Antonio  Assessment & Plan    ACUTE ON CHRONIC DIASTOLIC HF:      Volume managed per dialysis.     NSTEMI:     CABG tomorrow.    HTN:      Continue current meds including IV NTG.  We will titrate BP meds post CABG.    ANEMIA:  Status post transfusion again yesterday.  H/H stable.  Guaiac positive but no overt bleeding in BMs and wonder if this is mechanical trauma with hard BM vs hemorrhoid.  Many reasons for anemia and no black stools or hematochezia.  Follow Hgb.     MURMUR:  Mild AS.  We will follow this clinically.  No AVR indicated at this time.   For questions or updates, please contact [place] Please consult [email or web address] for contact info under Cardiology/STEMI.   Signed, Minus Breeding, MD  09/12/2020, 10:05 AM

## 2020-09-12 NOTE — Care Management Important Message (Signed)
Important Message  Patient Details  Name: Jodi Bell MRN: [id number] Date of Birth: 1960-08-28   Medicare Important Message Given:  Yes     Shelda Altes 09/12/2020, 10:55 AM

## 2020-09-12 NOTE — Anesthesia Preprocedure Evaluation (Addendum)
Anesthesia Evaluation  Patient identified by MRN, date of birth, ID band Patient awake    Reviewed: Allergy & Precautions, NPO status , Patient's Chart, lab work & pertinent test results  Airway Mallampati: II  TM Distance: >3 FB Neck ROM: Full    Dental  (+) Dental Advisory Given, Edentulous Upper, Edentulous Lower   Pulmonary shortness of breath, asthma , sleep apnea , pneumonia, COPD, Current Smoker and Patient abstained from smoking.,    Pulmonary exam normal breath sounds clear to auscultation       Cardiovascular hypertension, Pt. on medications and Pt. on home beta blockers + CAD, + Past MI, + Peripheral Vascular Disease and +CHF  + Valvular Problems/Murmurs AS  Rhythm:Regular Rate:Normal + Systolic murmurs Echo 0/94/0768 1. Left ventricular ejection fraction, by estimation, is 60%. The left ventricle has normal function. The left ventricle are septal wall motion abnormalities described below. There is mild concentric left ventricular hypertrophy with severe hypetrophy of the basal-septal segment. Left ventricular diastolic parameters are indeterminate in the setting of significant mitral annular calcification but suggestive of increase left atrial pressure.  2. Right ventricular systolic function is normal. The right ventricular size is normal. There is normal pulmonary artery systolic pressure.  3. A small pericardial effusion is present. The pericardial effusion is circumferential.  4. The mitral valve is degenerative. Mild mitral valve regurgitation. Severe mitral annular calcification.  5. The aortic valve is tricuspid. There is mild calcification of the aortic valve. There is moderate thickening of the aortic valve. Aortic valve regurgitation is not visualized. Mild aortic valve stenosis. Aortic valve mean gradient measures 24.0 mmHg.Aortic valve Vmax measures 3.26 m/s.  6. Left atrial size was severely dilated.     Neuro/Psych  Headaches, PSYCHIATRIC DISORDERS Anxiety    GI/Hepatic hiatal hernia, GERD  ,  Endo/Other  diabetes, Type 2  Renal/GU CRF and Renal InsufficiencyRenal disease     Musculoskeletal  (+) Arthritis ,   Abdominal   Peds  Hematology  (+) Blood dyscrasia, anemia ,   Anesthesia Other Findings   Reproductive/Obstetrics                                                            Anesthesia Evaluation  Patient identified by MRN, date of birth, ID band Patient awake    Reviewed: Allergy & Precautions, NPO status , Patient's Chart, lab work & pertinent test results, reviewed documented beta blocker date and time   Airway Mallampati: II  TM Distance: >3 FB Neck ROM: Limited   Comment: C-collar in place Dental  (+) Teeth Intact, Dental Advisory Given   Pulmonary asthma , sleep apnea and Continuous Positive Airway Pressure Ventilation , COPD,  COPD inhaler, Current Smoker and Patient abstained from smoking.,    Pulmonary exam normal breath sounds clear to auscultation       Cardiovascular hypertension, Pt. on medications and Pt. on home beta blockers  Rhythm:Regular Rate:Tachycardia + Systolic murmurs    Neuro/Psych  Headaches, negative psych ROS   GI/Hepatic Neg liver ROS, hiatal hernia, GERD  ,GI Bleed    Endo/Other  diabetes, Type 2, Oral Hypoglycemic AgentsObesity   Renal/GU ESRF and DialysisRenal disease     Musculoskeletal  (+) Arthritis ,   Abdominal   Peds  Hematology  (+) Blood dyscrasia, anemia ,  Anesthesia Other Findings Day of surgery medications reviewed with the patient.  Reproductive/Obstetrics                            Anesthesia Physical Anesthesia Plan  ASA: III  Anesthesia Plan: MAC   Post-op Pain Management:    Induction: Intravenous  PONV Risk Score and Plan: 2 and Propofol infusion and Treatment may vary due to age or medical condition  Airway  Management Planned: Nasal Cannula and Natural Airway  Additional Equipment:   Intra-op Plan:   Post-operative Plan:   Informed Consent: I have reviewed the patients History and Physical, chart, labs and discussed the procedure including the risks, benefits and alternatives for the proposed anesthesia with the patient or authorized representative who has indicated his/her understanding and acceptance.     Dental advisory given  Plan Discussed with: CRNA and Anesthesiologist  Anesthesia Plan Comments:         Anesthesia Quick Evaluation  Anesthesia Physical Anesthesia Plan  ASA: 4  Anesthesia Plan: General   Post-op Pain Management:    Induction: Intravenous  PONV Risk Score and Plan: 3 and Treatment may vary due to age or medical condition and Midazolam  Airway Management Planned: Oral ETT  Additional Equipment: Arterial line, PA Cath, TEE, Ultrasound Guidance Line Placement and CVP  Intra-op Plan:   Post-operative Plan: Post-operative intubation/ventilation  Informed Consent: I have reviewed the patients History and Physical, chart, labs and discussed the procedure including the risks, benefits and alternatives for the proposed anesthesia with the patient or authorized representative who has indicated his/her understanding and acceptance.     Dental advisory given  Plan Discussed with: CRNA  Anesthesia Plan Comments:       Anesthesia Quick Evaluation

## 2020-09-12 NOTE — Progress Notes (Signed)
2 Days Post-Op Procedure(s) (LRB): LEFT HEART CATH AND CORONARY ANGIOGRAPHY (N/A) Subjective: No complaints of chest pain or dyspnea. On NTG drip  Objective: Vital signs in last 24 hours: Temp:  [97.8 F (36.6 C)-98.4 F (36.9 C)] 98.4 F (36.9 C) (08/03 0717) Pulse Rate:  [71-73] 71 (08/03 0717) Cardiac Rhythm: Normal sinus rhythm (08/02 1900) Resp:  [12-18] 18 (08/03 0717) BP: (140-166)/(70-83) 166/75 (08/03 0717) SpO2:  [99 %-100 %] 99 % (08/03 0717) Weight:  [63.6 kg] 63.6 kg (08/03 0200)  Hemodynamic parameters for last 24 hours:    Intake/Output from previous day: 08/02 0701 - 08/03 0700 In: 460 [P.O.:460] Out: -  Intake/Output this shift: No intake/output data recorded.  General appearance: alert and cooperative Neurologic: intact Heart: regular rate and rhythm and systolic murmur RSB Lungs: clear to auscultation bilaterally  Lab Results: Recent Labs    09/11/20 0358 09/12/20 0228  WBC 5.9 5.2  HGB 7.8* 8.1*  HCT 23.7* 24.0*  PLT 175 170   BMET:  Recent Labs    09/11/20 0358 09/12/20 0228  NA 132* 129*  K 4.1 4.3  CL 93* 92*  CO2 28 26  GLUCOSE 120* 122*  BUN 19 22*  CREATININE 4.36* 5.59*  CALCIUM 10.5* 10.4*    PT/INR: No results for input(s): LABPROT, INR in the last 72 hours. ABG    Component Value Date/Time   PHART 7.454 (H) 05/17/2018 0344   HCO3 26.5 05/17/2018 0344   TCO2 28 05/17/2018 0344   ACIDBASEDEF 3.6 (H) 05/16/2018 0411   O2SAT 100.0 05/17/2018 0344   CBG (last 3)  Recent Labs    09/11/20 1611 09/11/20 2044 09/12/20 0602  GLUCAP 160* 124* 108*    Assessment/Plan:  Severe multi-vessel CAD. MRI of spine shows chronic degenerative changes but no signs of osteomyelitis or discitis. Carotid dopplers show no stenosis. Will plan to proceed with CABG tomorrow. She is having HD today.  LOS: 4 days    Gaye Pollack 09/12/2020

## 2020-09-12 NOTE — Progress Notes (Signed)
[id number] Gave pt OHS booklet, care guide, staying in the tube handout, and wrote down how to view pre op video. Discussed importance of sternal precautions. Gave IS and pt demonstrated 1000 ml correctly. Discussed importance of walking and IS after surgery. Pt stated her daughter will be able to assist in care after discharge. Will follow up after surgery. Graylon Good RN BSN 09/12/2020 10:07 AM

## 2020-09-12 NOTE — Progress Notes (Signed)
Physical Therapy Treatment Patient Details Name: Jodi Bell MRN: [id number] DOB: [birth year] Today's Date: 09/12/2020    History of Present Illness Pt is a 60 y.o. female admitted 09/08/20 with acute onset SOB. Workup for CHF decompensaiton, COPD exacerbation, HTN urgency, NSTEMI; need for emergent HD. S/p LHC 8/1 which showed severe two-vessel CAD. Likely plan for CABG on 8/4. PMH includes ESRD (HD MWF), HTN, DM2, COPD, OSA, psoriatic RA, PAD, anemia, tobacco abuse.   PT Comments    Pt progressing well with mobility. Reinforced sternal precautions with transfer and gait training in preparation for likely CABG, as well as typical activity progression post-op. Will follow-up for continued PT treatment post-op.    Follow Up Recommendations  Other (TBD post-op)     Equipment Recommendations  Rollator   Recommendations for Other Services       Precautions / Restrictions Precautions Precautions: Fall;Other (comment) Precaution Comments: Reviewed sternal precautions in preparation for CABG Restrictions Weight Bearing Restrictions: No    Mobility  Bed Mobility Overal bed mobility: Modified Independent             General bed mobility comments: HOB elevated    Transfers Overall transfer level: Needs assistance Equipment used: None Transfers: Sit to/from Stand Sit to Stand: Supervision         General transfer comment: Practiced multiple sit<>stands without DME with pt pushing from knees to maintain "sternal precautions", supervision for balance and technique  Ambulation/Gait Ambulation/Gait assistance: Supervision Gait Distance (Feet): 120 Feet Assistive device: 4-wheeled walker Gait Pattern/deviations: Step-through pattern;Decreased stride length;Trunk flexed Gait velocity: Decreased   General Gait Details: Slow, steady gait with rollator, supervision for safety; no overt instability or LOB; pt reports further distance limited by BLE fatigue   Stairs              Wheelchair Mobility    Modified Rankin (Stroke Patients Only)       Balance Overall balance assessment: Needs assistance   Sitting balance-Leahy Scale: Good     Standing balance support: No upper extremity supported;During functional activity Standing balance-Leahy Scale: Fair Standing balance comment: Can static stand and take steps without UE support; static and dynamic stability improved with UE support                            Cognition Arousal/Alertness: Awake/alert Behavior During Therapy: WFL for tasks assessed/performed;Flat affect Overall Cognitive Status: Within Functional Limits for tasks assessed                                 General Comments: WFL for majority of simple tasks; question decreased awareness or insight into deficits      Exercises Other Exercises Other Exercises: Repeated sit<>stands with hands on knees (reliant on momentum and UE support)    General Comments General comments (skin integrity, edema, etc.): Continued to reinforce sternal precautions with mobility throughout session; also educ on typical activity progression post-op      Pertinent Vitals/Pain Pain Assessment: No/denies pain    Home Living                      Prior Function            PT Goals (current goals can now be found in the care plan section) Progress towards PT goals: Progressing toward goals    Frequency    Min 3X/week  PT Plan Current plan remains appropriate    Co-evaluation              AM-PAC PT "6 Clicks" Mobility   Outcome Measure  Help needed turning from your back to your side while in a flat bed without using bedrails?: None Help needed moving from lying on your back to sitting on the side of a flat bed without using bedrails?: None Help needed moving to and from a bed to a chair (including a wheelchair)?: None Help needed standing up from a chair using your arms (e.g., wheelchair or  bedside chair)?: None Help needed to walk in hospital room?: A Little Help needed climbing 3-5 steps with a railing? : A Little 6 Click Score: 22    End of Session   Activity Tolerance: Patient tolerated treatment well;Patient limited by fatigue Patient left: with call bell/phone within reach;in bed Nurse Communication: Mobility status PT Visit Diagnosis: Other abnormalities of gait and mobility (R26.89);Muscle weakness (generalized) (M62.81)     Time: [id number] PT Time Calculation (min) (ACUTE ONLY): 14 min  Charges:  $Therapeutic Activity: 8-22 mins                     Mabeline Caras, PT, DPT Acute Rehabilitation Services  Pager [phone] Office [place] 09/12/2020, 12:54 PM

## 2020-09-13 ENCOUNTER — Inpatient Hospital Stay (HOSPITAL_COMMUNITY): Payer: Medicare PPO

## 2020-09-13 ENCOUNTER — Inpatient Hospital Stay (HOSPITAL_COMMUNITY): Payer: Medicare PPO | Admitting: Anesthesiology

## 2020-09-13 ENCOUNTER — Encounter (HOSPITAL_COMMUNITY): Payer: Self-pay | Admitting: Internal Medicine

## 2020-09-13 ENCOUNTER — Inpatient Hospital Stay (HOSPITAL_COMMUNITY)
Admission: EM | Disposition: A | Discharge status: Home Health | Payer: Self-pay | Source: Home / Self Care | Attending: Surgery

## 2020-09-13 DIAGNOSIS — I2510 Atherosclerotic heart disease of native coronary artery without angina pectoris: Secondary | ICD-10-CM

## 2020-09-13 DIAGNOSIS — Z951 Presence of aortocoronary bypass graft: Secondary | ICD-10-CM

## 2020-09-13 HISTORY — PX: CORONARY ARTERY BYPASS GRAFT: SHX141

## 2020-09-13 HISTORY — PX: TEE WITHOUT CARDIOVERSION: SHX5443

## 2020-09-13 LAB — TYPE AND SCREEN
ABO/RH(D): AB POS
Antibody Screen: NEGATIVE
Unit division: 0
Unit division: 0

## 2020-09-13 LAB — POCT I-STAT 7, (LYTES, BLD GAS, ICA,H+H)
Acid-Base Excess: 9 mmol/L — ABNORMAL HIGH (ref 0.0–2.0)
Acid-Base Excess: 3 mmol/L — ABNORMAL HIGH (ref 0.0–2.0)
Acid-Base Excess: 7 mmol/L — ABNORMAL HIGH (ref 0.0–2.0)
Acid-Base Excess: 10 mmol/L — ABNORMAL HIGH (ref 0.0–2.0)
Acid-Base Excess: 6 mmol/L — ABNORMAL HIGH (ref 0.0–2.0)
Acid-Base Excess: 1 mmol/L (ref 0.0–2.0)
Acid-Base Excess: 0 mmol/L (ref 0.0–2.0)
Acid-Base Excess: 0 mmol/L (ref 0.0–2.0)
Acid-base deficit: 1 mmol/L (ref 0.0–2.0)
Bicarbonate: 33.9 mmol/L — ABNORMAL HIGH (ref 20.0–28.0)
Bicarbonate: 27.4 mmol/L (ref 20.0–28.0)
Bicarbonate: 29.8 mmol/L — ABNORMAL HIGH (ref 20.0–28.0)
Bicarbonate: 29.3 mmol/L — ABNORMAL HIGH (ref 20.0–28.0)
Bicarbonate: 31.6 mmol/L — ABNORMAL HIGH (ref 20.0–28.0)
Bicarbonate: 26.4 mmol/L (ref 20.0–28.0)
Bicarbonate: 26 mmol/L (ref 20.0–28.0)
Bicarbonate: 25.2 mmol/L (ref 20.0–28.0)
Bicarbonate: 25.5 mmol/L (ref 20.0–28.0)
Calcium, Ion: 1.35 mmol/L (ref 1.15–1.40)
Calcium, Ion: 1.05 mmol/L — ABNORMAL LOW (ref 1.15–1.40)
Calcium, Ion: 1.08 mmol/L — ABNORMAL LOW (ref 1.15–1.40)
Calcium, Ion: 1.13 mmol/L — ABNORMAL LOW (ref 1.15–1.40)
Calcium, Ion: 1.15 mmol/L (ref 1.15–1.40)
Calcium, Ion: 1 mmol/L — ABNORMAL LOW (ref 1.15–1.40)
Calcium, Ion: 1.35 mmol/L (ref 1.15–1.40)
Calcium, Ion: 1.34 mmol/L (ref 1.15–1.40)
Calcium, Ion: 1.39 mmol/L (ref 1.15–1.40)
HCT: 26 % — ABNORMAL LOW (ref 36.0–46.0)
HCT: 21 % — ABNORMAL LOW (ref 36.0–46.0)
HCT: 22 % — ABNORMAL LOW (ref 36.0–46.0)
HCT: 17 % — ABNORMAL LOW (ref 36.0–46.0)
HCT: 21 % — ABNORMAL LOW (ref 36.0–46.0)
HCT: 23 % — ABNORMAL LOW (ref 36.0–46.0)
HCT: 32 % — ABNORMAL LOW (ref 36.0–46.0)
HCT: 33 % — ABNORMAL LOW (ref 36.0–46.0)
HCT: 35 % — ABNORMAL LOW (ref 36.0–46.0)
Hemoglobin: 8.8 g/dL — ABNORMAL LOW (ref 12.0–15.0)
Hemoglobin: 7.1 g/dL — ABNORMAL LOW (ref 12.0–15.0)
Hemoglobin: 7.5 g/dL — ABNORMAL LOW (ref 12.0–15.0)
Hemoglobin: 5.8 g/dL — CL (ref 12.0–15.0)
Hemoglobin: 7.1 g/dL — ABNORMAL LOW (ref 12.0–15.0)
Hemoglobin: 7.8 g/dL — ABNORMAL LOW (ref 12.0–15.0)
Hemoglobin: 10.9 g/dL — ABNORMAL LOW (ref 12.0–15.0)
Hemoglobin: 11.2 g/dL — ABNORMAL LOW (ref 12.0–15.0)
Hemoglobin: 11.9 g/dL — ABNORMAL LOW (ref 12.0–15.0)
O2 Saturation: 100 %
O2 Saturation: 100 %
O2 Saturation: 89 %
O2 Saturation: 100 %
O2 Saturation: 100 %
O2 Saturation: 100 %
O2 Saturation: 100 %
O2 Saturation: 99 %
O2 Saturation: 99 %
Patient temperature: 35.4
Patient temperature: 37.1
Patient temperature: 37.2
Patient temperature: 37.3
Potassium: 3.4 mmol/L — ABNORMAL LOW (ref 3.5–5.1)
Potassium: 3.8 mmol/L (ref 3.5–5.1)
Potassium: 4.7 mmol/L (ref 3.5–5.1)
Potassium: 4.1 mmol/L (ref 3.5–5.1)
Potassium: 4.4 mmol/L (ref 3.5–5.1)
Potassium: 4 mmol/L (ref 3.5–5.1)
Potassium: 3.7 mmol/L (ref 3.5–5.1)
Potassium: 3.7 mmol/L (ref 3.5–5.1)
Potassium: 3.8 mmol/L (ref 3.5–5.1)
Sodium: 134 mmol/L — ABNORMAL LOW (ref 135–145)
Sodium: 134 mmol/L — ABNORMAL LOW (ref 135–145)
Sodium: 134 mmol/L — ABNORMAL LOW (ref 135–145)
Sodium: 134 mmol/L — ABNORMAL LOW (ref 135–145)
Sodium: 135 mmol/L (ref 135–145)
Sodium: 135 mmol/L (ref 135–145)
Sodium: 135 mmol/L (ref 135–145)
Sodium: 133 mmol/L — ABNORMAL LOW (ref 135–145)
Sodium: 134 mmol/L — ABNORMAL LOW (ref 135–145)
TCO2: 35 mmol/L — ABNORMAL HIGH (ref 22–32)
TCO2: 29 mmol/L (ref 22–32)
TCO2: 31 mmol/L (ref 22–32)
TCO2: 30 mmol/L (ref 22–32)
TCO2: 32 mmol/L (ref 22–32)
TCO2: 28 mmol/L (ref 22–32)
TCO2: 27 mmol/L (ref 22–32)
TCO2: 26 mmol/L (ref 22–32)
TCO2: 27 mmol/L (ref 22–32)
pCO2 arterial: 49.8 mmHg — ABNORMAL HIGH (ref 32.0–48.0)
pCO2 arterial: 31.3 mmHg — ABNORMAL LOW (ref 32.0–48.0)
pCO2 arterial: 38.5 mmHg (ref 32.0–48.0)
pCO2 arterial: 30.3 mmHg — ABNORMAL LOW (ref 32.0–48.0)
pCO2 arterial: 35.6 mmHg (ref 32.0–48.0)
pCO2 arterial: 43 mmHg (ref 32.0–48.0)
pCO2 arterial: 48.2 mmHg — ABNORMAL HIGH (ref 32.0–48.0)
pCO2 arterial: 43 mmHg (ref 32.0–48.0)
pCO2 arterial: 50.6 mmHg — ABNORMAL HIGH (ref 32.0–48.0)
pH, Arterial: 7.441 (ref 7.350–7.450)
pH, Arterial: 7.461 — ABNORMAL HIGH (ref 7.350–7.450)
pH, Arterial: 7.587 — ABNORMAL HIGH (ref 7.350–7.450)
pH, Arterial: 7.523 — ABNORMAL HIGH (ref 7.350–7.450)
pH, Arterial: 7.626 (ref 7.350–7.450)
pH, Arterial: 7.39 (ref 7.350–7.450)
pH, Arterial: 7.34 — ABNORMAL LOW (ref 7.350–7.450)
pH, Arterial: 7.376 (ref 7.350–7.450)
pH, Arterial: 7.311 — ABNORMAL LOW (ref 7.350–7.450)
pO2, Arterial: 510 mmHg — ABNORMAL HIGH (ref 83.0–108.0)
pO2, Arterial: 426 mmHg — ABNORMAL HIGH (ref 83.0–108.0)
pO2, Arterial: 54 mmHg — ABNORMAL LOW (ref 83.0–108.0)
pO2, Arterial: 306 mmHg — ABNORMAL HIGH (ref 83.0–108.0)
pO2, Arterial: 411 mmHg — ABNORMAL HIGH (ref 83.0–108.0)
pO2, Arterial: 215 mmHg — ABNORMAL HIGH (ref 83.0–108.0)
pO2, Arterial: 186 mmHg — ABNORMAL HIGH (ref 83.0–108.0)
pO2, Arterial: 147 mmHg — ABNORMAL HIGH (ref 83.0–108.0)
pO2, Arterial: 170 mmHg — ABNORMAL HIGH (ref 83.0–108.0)

## 2020-09-13 LAB — CBC
HCT: 23.7 % — ABNORMAL LOW (ref 36.0–46.0)
HCT: 24.5 % — ABNORMAL LOW (ref 36.0–46.0)
HCT: 32.7 % — ABNORMAL LOW (ref 36.0–46.0)
Hemoglobin: 7.9 g/dL — ABNORMAL LOW (ref 12.0–15.0)
Hemoglobin: 8.1 g/dL — ABNORMAL LOW (ref 12.0–15.0)
Hemoglobin: 11 g/dL — ABNORMAL LOW (ref 12.0–15.0)
MCH: 32.4 pg (ref 26.0–34.0)
MCH: 30 pg (ref 26.0–34.0)
MCH: 29.8 pg (ref 26.0–34.0)
MCHC: 33.3 g/dL (ref 30.0–36.0)
MCHC: 33.1 g/dL (ref 30.0–36.0)
MCHC: 33.6 g/dL (ref 30.0–36.0)
MCV: 97.1 fL (ref 80.0–100.0)
MCV: 90.7 fL (ref 80.0–100.0)
MCV: 88.6 fL (ref 80.0–100.0)
Platelets: 167 10*3/uL (ref 150–400)
Platelets: 87 10*3/uL — ABNORMAL LOW (ref 150–400)
Platelets: 119 10*3/uL — ABNORMAL LOW (ref 150–400)
RBC: 2.44 MIL/uL — ABNORMAL LOW (ref 3.87–5.11)
RBC: 2.7 MIL/uL — ABNORMAL LOW (ref 3.87–5.11)
RBC: 3.69 MIL/uL — ABNORMAL LOW (ref 3.87–5.11)
RDW: 15.9 % — ABNORMAL HIGH (ref 11.5–15.5)
RDW: 23.6 % — ABNORMAL HIGH (ref 11.5–15.5)
RDW: 24.3 % — ABNORMAL HIGH (ref 11.5–15.5)
WBC: 5.4 10*3/uL (ref 4.0–10.5)
WBC: 7 10*3/uL (ref 4.0–10.5)
WBC: 6.9 10*3/uL (ref 4.0–10.5)
nRBC: 0 % (ref 0.0–0.2)
nRBC: 0 % (ref 0.0–0.2)
nRBC: 0 % (ref 0.0–0.2)

## 2020-09-13 LAB — POCT I-STAT, CHEM 8
BUN: 7 mg/dL (ref 6–20)
BUN: 9 mg/dL (ref 6–20)
BUN: 8 mg/dL (ref 6–20)
BUN: 9 mg/dL (ref 6–20)
BUN: 9 mg/dL (ref 6–20)
Calcium, Ion: 1.38 mmol/L (ref 1.15–1.40)
Calcium, Ion: 1.32 mmol/L (ref 1.15–1.40)
Calcium, Ion: 1.1 mmol/L — ABNORMAL LOW (ref 1.15–1.40)
Calcium, Ion: 1.29 mmol/L (ref 1.15–1.40)
Calcium, Ion: 1.13 mmol/L — ABNORMAL LOW (ref 1.15–1.40)
Chloride: 95 mmol/L — ABNORMAL LOW (ref 98–111)
Chloride: 95 mmol/L — ABNORMAL LOW (ref 98–111)
Chloride: 96 mmol/L — ABNORMAL LOW (ref 98–111)
Chloride: 97 mmol/L — ABNORMAL LOW (ref 98–111)
Chloride: 96 mmol/L — ABNORMAL LOW (ref 98–111)
Creatinine, Ser: 3.3 mg/dL — ABNORMAL HIGH (ref 0.44–1.00)
Creatinine, Ser: 3.3 mg/dL — ABNORMAL HIGH (ref 0.44–1.00)
Creatinine, Ser: 3.1 mg/dL — ABNORMAL HIGH (ref 0.44–1.00)
Creatinine, Ser: 3.3 mg/dL — ABNORMAL HIGH (ref 0.44–1.00)
Creatinine, Ser: 3.1 mg/dL — ABNORMAL HIGH (ref 0.44–1.00)
Glucose, Bld: 88 mg/dL (ref 70–99)
Glucose, Bld: 101 mg/dL — ABNORMAL HIGH (ref 70–99)
Glucose, Bld: 103 mg/dL — ABNORMAL HIGH (ref 70–99)
Glucose, Bld: 112 mg/dL — ABNORMAL HIGH (ref 70–99)
Glucose, Bld: 143 mg/dL — ABNORMAL HIGH (ref 70–99)
HCT: 24 % — ABNORMAL LOW (ref 36.0–46.0)
HCT: 23 % — ABNORMAL LOW (ref 36.0–46.0)
HCT: 19 % — ABNORMAL LOW (ref 36.0–46.0)
HCT: 20 % — ABNORMAL LOW (ref 36.0–46.0)
HCT: 19 % — ABNORMAL LOW (ref 36.0–46.0)
Hemoglobin: 8.2 g/dL — ABNORMAL LOW (ref 12.0–15.0)
Hemoglobin: 7.8 g/dL — ABNORMAL LOW (ref 12.0–15.0)
Hemoglobin: 6.5 g/dL — CL (ref 12.0–15.0)
Hemoglobin: 6.8 g/dL — CL (ref 12.0–15.0)
Hemoglobin: 6.5 g/dL — CL (ref 12.0–15.0)
Potassium: 3.4 mmol/L — ABNORMAL LOW (ref 3.5–5.1)
Potassium: 3.2 mmol/L — ABNORMAL LOW (ref 3.5–5.1)
Potassium: 3.2 mmol/L — ABNORMAL LOW (ref 3.5–5.1)
Potassium: 4.3 mmol/L (ref 3.5–5.1)
Potassium: 4.1 mmol/L (ref 3.5–5.1)
Sodium: 132 mmol/L — ABNORMAL LOW (ref 135–145)
Sodium: 132 mmol/L — ABNORMAL LOW (ref 135–145)
Sodium: 130 mmol/L — ABNORMAL LOW (ref 135–145)
Sodium: 133 mmol/L — ABNORMAL LOW (ref 135–145)
Sodium: 133 mmol/L — ABNORMAL LOW (ref 135–145)
TCO2: 33 mmol/L — ABNORMAL HIGH (ref 22–32)
TCO2: 28 mmol/L (ref 22–32)
TCO2: 28 mmol/L (ref 22–32)
TCO2: 33 mmol/L — ABNORMAL HIGH (ref 22–32)
TCO2: 28 mmol/L (ref 22–32)

## 2020-09-13 LAB — PREPARE RBC (CROSSMATCH)

## 2020-09-13 LAB — HEMOGLOBIN AND HEMATOCRIT, BLOOD
HCT: 20 % — ABNORMAL LOW (ref 36.0–46.0)
Hemoglobin: 6.8 g/dL — CL (ref 12.0–15.0)

## 2020-09-13 LAB — PROTIME-INR
INR: 1.7 — ABNORMAL HIGH (ref 0.8–1.2)
INR: 1.3 — ABNORMAL HIGH (ref 0.8–1.2)
Prothrombin Time: 19.5 seconds — ABNORMAL HIGH (ref 11.4–15.2)
Prothrombin Time: 16.3 seconds — ABNORMAL HIGH (ref 11.4–15.2)

## 2020-09-13 LAB — COMPREHENSIVE METABOLIC PANEL
ALT: 9 U/L (ref 0–44)
ALT: 9 U/L (ref 0–44)
AST: 13 U/L — ABNORMAL LOW (ref 15–41)
AST: 16 U/L (ref 15–41)
Albumin: 2.2 g/dL — ABNORMAL LOW (ref 3.5–5.0)
Albumin: 2.2 g/dL — ABNORMAL LOW (ref 3.5–5.0)
Alkaline Phosphatase: 68 U/L (ref 38–126)
Alkaline Phosphatase: 45 U/L (ref 38–126)
Anion gap: 9 (ref 5–15)
Anion gap: 9 (ref 5–15)
BUN: <5 mg/dL — ABNORMAL LOW (ref 6–20)
BUN: 13 mg/dL (ref 6–20)
CO2: 29 mmol/L (ref 22–32)
CO2: 25 mmol/L (ref 22–32)
Calcium: 9.8 mg/dL (ref 8.9–10.3)
Calcium: 8.8 mg/dL — ABNORMAL LOW (ref 8.9–10.3)
Chloride: 92 mmol/L — ABNORMAL LOW (ref 98–111)
Chloride: 100 mmol/L (ref 98–111)
Creatinine, Ser: 3.13 mg/dL — ABNORMAL HIGH (ref 0.44–1.00)
Creatinine, Ser: 3.42 mg/dL — ABNORMAL HIGH (ref 0.44–1.00)
GFR, Estimated: 16 mL/min — ABNORMAL LOW (ref >60)
GFR, Estimated: 15 mL/min — ABNORMAL LOW (ref >60)
Glucose, Bld: 111 mg/dL — ABNORMAL HIGH (ref 70–99)
Glucose, Bld: 84 mg/dL (ref 70–99)
Potassium: 3.3 mmol/L — ABNORMAL LOW (ref 3.5–5.1)
Potassium: 3.7 mmol/L (ref 3.5–5.1)
Sodium: 130 mmol/L — ABNORMAL LOW (ref 135–145)
Sodium: 134 mmol/L — ABNORMAL LOW (ref 135–145)
Total Bilirubin: 0.4 mg/dL (ref 0.3–1.2)
Total Bilirubin: 0.5 mg/dL (ref 0.3–1.2)
Total Protein: 5 g/dL — ABNORMAL LOW (ref 6.5–8.1)
Total Protein: 4 g/dL — ABNORMAL LOW (ref 6.5–8.1)

## 2020-09-13 LAB — GLUCOSE, CAPILLARY
Glucose-Capillary: 106 mg/dL — ABNORMAL HIGH (ref 70–99)
Glucose-Capillary: 107 mg/dL — ABNORMAL HIGH (ref 70–99)
Glucose-Capillary: 113 mg/dL — ABNORMAL HIGH (ref 70–99)
Glucose-Capillary: 115 mg/dL — ABNORMAL HIGH (ref 70–99)
Glucose-Capillary: 126 mg/dL — ABNORMAL HIGH (ref 70–99)
Glucose-Capillary: 170 mg/dL — ABNORMAL HIGH (ref 70–99)
Glucose-Capillary: 50 mg/dL — ABNORMAL LOW (ref 70–99)
Glucose-Capillary: 67 mg/dL — ABNORMAL LOW (ref 70–99)
Glucose-Capillary: 79 mg/dL (ref 70–99)
Glucose-Capillary: 80 mg/dL (ref 70–99)
Glucose-Capillary: 81 mg/dL (ref 70–99)
Glucose-Capillary: 84 mg/dL (ref 70–99)
Glucose-Capillary: 93 mg/dL (ref 70–99)
Glucose-Capillary: 93 mg/dL (ref 70–99)
Glucose-Capillary: 99 mg/dL (ref 70–99)

## 2020-09-13 LAB — BPAM RBC
Blood Product Expiration Date: 202209072359
Blood Product Expiration Date: 202209072359
ISSUE DATE / TIME: 202208010111
Unit Type and Rh: 8400
Unit Type and Rh: 8400

## 2020-09-13 LAB — APTT
aPTT: 37 seconds — ABNORMAL HIGH (ref 24–36)
aPTT: 32 seconds (ref 24–36)

## 2020-09-13 LAB — HEMOGLOBIN A1C
Hgb A1c MFr Bld: 5.2 % (ref 4.8–5.6)
Mean Plasma Glucose: 102.54 mg/dL

## 2020-09-13 LAB — PLATELET COUNT: Platelets: 119 10*3/uL — ABNORMAL LOW (ref 150–400)

## 2020-09-13 LAB — PHOSPHORUS: Phosphorus: 3.2 mg/dL (ref 2.5–4.6)

## 2020-09-13 SURGERY — CORONARY ARTERY BYPASS GRAFTING (CABG)
Anesthesia: General | Site: Chest

## 2020-09-13 MED ORDER — DEXMEDETOMIDINE HCL IN NACL 400 MCG/100ML IV SOLN: 0.0000 ug/kg/h | INTRAVENOUS | Status: DC

## 2020-09-13 MED ORDER — ROCURONIUM BROMIDE 10 MG/ML (PF) SYRINGE
PREFILLED_SYRINGE | INTRAVENOUS | Status: AC
  Filled 2020-09-13: qty 10

## 2020-09-13 MED ORDER — SODIUM CHLORIDE 0.9 % IV SOLN: INTRAVENOUS | Status: DC | PRN

## 2020-09-13 MED ORDER — SODIUM CHLORIDE 0.9% IV SOLUTION: Freq: Once | INTRAVENOUS | Status: DC

## 2020-09-13 MED ORDER — FENTANYL CITRATE (PF) 250 MCG/5ML IJ SOLN
INTRAMUSCULAR | Status: AC
  Filled 2020-09-13: qty 20

## 2020-09-13 MED ORDER — CEFAZOLIN SODIUM-DEXTROSE 2-4 GM/100ML-% IV SOLN
2.0000 g | Freq: Three times a day (TID) | INTRAVENOUS | Status: AC
Start: 2020-09-13 — End: 2020-09-15
  Administered 2020-09-13 – 2020-09-15 (×6): 2 g via INTRAVENOUS
  Filled 2020-09-13 (×6): qty 100

## 2020-09-13 MED ORDER — THROMBIN (RECOMBINANT) 20000 UNITS EX SOLR
CUTANEOUS | Status: AC
  Filled 2020-09-13: qty 20000

## 2020-09-13 MED ORDER — 0.9 % SODIUM CHLORIDE (POUR BTL) OPTIME
TOPICAL | Status: DC | PRN
  Administered 2020-09-13: 5000 mL

## 2020-09-13 MED ORDER — PROTAMINE SULFATE 10 MG/ML IV SOLN
INTRAVENOUS | Status: AC
  Filled 2020-09-13: qty 25

## 2020-09-13 MED ORDER — EPHEDRINE 5 MG/ML INJ
INTRAVENOUS | Status: AC
  Filled 2020-09-13: qty 5

## 2020-09-13 MED ORDER — DEXTROSE 50 % IV SOLN
0.0000 mL | INTRAVENOUS | Status: DC | PRN
  Administered 2020-09-13: 35 mL via INTRAVENOUS
  Administered 2020-09-13 – 2020-09-15 (×4): 50 mL via INTRAVENOUS
  Administered 2020-09-16: 25 mL via INTRAVENOUS
  Administered 2020-09-16 (×2): 50 mL via INTRAVENOUS
  Administered 2020-09-19: 40 mL via INTRAVENOUS
  Filled 2020-09-13 (×7): qty 50

## 2020-09-13 MED ORDER — PHENYLEPHRINE 40 MCG/ML (10ML) SYRINGE FOR IV PUSH (FOR BLOOD PRESSURE SUPPORT)
PREFILLED_SYRINGE | INTRAVENOUS | Status: AC
  Filled 2020-09-13: qty 10

## 2020-09-13 MED ORDER — LACTATED RINGERS IV SOLN: 500.0000 mL | Freq: Once | INTRAVENOUS | Status: DC | PRN

## 2020-09-13 MED ORDER — NICARDIPINE HCL IN NACL 20-0.86 MG/200ML-% IV SOLN
3.0000 mg/h | INTRAVENOUS | Status: DC
  Administered 2020-09-13: 3 mg/h via INTRAVENOUS
  Administered 2020-09-14 (×3): 5 mg/h via INTRAVENOUS
  Administered 2020-09-14: 2 mg/h via INTRAVENOUS
  Administered 2020-09-14: 5 mg/h via INTRAVENOUS
  Filled 2020-09-13 (×7): qty 200

## 2020-09-13 MED ORDER — SODIUM CHLORIDE 0.9% FLUSH: 3.0000 mL | INTRAVENOUS | Status: DC | PRN

## 2020-09-13 MED ORDER — SODIUM CHLORIDE 0.45 % IV SOLN
INTRAVENOUS | Status: DC | PRN
Start: 2020-09-13 — End: 2020-09-19

## 2020-09-13 MED ORDER — HEMOSTATIC AGENTS (NO CHARGE) OPTIME
TOPICAL | Status: DC | PRN
  Administered 2020-09-13: 1 via TOPICAL

## 2020-09-13 MED ORDER — SODIUM CHLORIDE 0.9 % IV SOLN
250.0000 mL | INTRAVENOUS | Status: DC
  Administered 2020-09-14: 250 mL via INTRAVENOUS

## 2020-09-13 MED ORDER — MAGNESIUM SULFATE 4 GM/100ML IV SOLN
4.0000 g | Freq: Once | INTRAVENOUS | Status: DC
  Filled 2020-09-13: qty 100

## 2020-09-13 MED ORDER — MIDAZOLAM HCL 2 MG/2ML IJ SOLN: 2.0000 mg | INTRAMUSCULAR | Status: DC | PRN

## 2020-09-13 MED ORDER — ACETAMINOPHEN 500 MG PO TABS
1000.0000 mg | ORAL_TABLET | Freq: Four times a day (QID) | ORAL | Status: AC
  Administered 2020-09-14 – 2020-09-17 (×4): 1000 mg via ORAL
  Filled 2020-09-13 (×5): qty 2

## 2020-09-13 MED ORDER — ROCURONIUM BROMIDE 10 MG/ML (PF) SYRINGE
PREFILLED_SYRINGE | INTRAVENOUS | Status: DC | PRN
  Administered 2020-09-13: 50 mg via INTRAVENOUS
  Administered 2020-09-13: 80 mg via INTRAVENOUS
  Administered 2020-09-13: 20 mg via INTRAVENOUS

## 2020-09-13 MED ORDER — PHENYLEPHRINE 40 MCG/ML (10ML) SYRINGE FOR IV PUSH (FOR BLOOD PRESSURE SUPPORT)
PREFILLED_SYRINGE | INTRAVENOUS | Status: DC | PRN
  Administered 2020-09-13: 40 ug via INTRAVENOUS

## 2020-09-13 MED ORDER — ARTIFICIAL TEARS OPHTHALMIC OINT
TOPICAL_OINTMENT | OPHTHALMIC | Status: AC
  Filled 2020-09-13: qty 3.5

## 2020-09-13 MED ORDER — ACETAMINOPHEN 650 MG RE SUPP
650.0000 mg | Freq: Once | RECTAL | Status: AC
Start: 2020-09-13 — End: 2020-09-18

## 2020-09-13 MED ORDER — FENTANYL CITRATE (PF) 250 MCG/5ML IJ SOLN
INTRAMUSCULAR | Status: DC | PRN
  Administered 2020-09-13: 200 ug via INTRAVENOUS
  Administered 2020-09-13 (×2): 100 ug via INTRAVENOUS
  Administered 2020-09-13: 150 ug via INTRAVENOUS
  Administered 2020-09-13 (×2): 100 ug via INTRAVENOUS
  Administered 2020-09-13: 50 ug via INTRAVENOUS
  Administered 2020-09-13: 150 ug via INTRAVENOUS
  Administered 2020-09-13: 100 ug via INTRAVENOUS
  Administered 2020-09-13: 50 ug via INTRAVENOUS
  Administered 2020-09-13: 150 ug via INTRAVENOUS

## 2020-09-13 MED ORDER — BISACODYL 5 MG PO TBEC
10.0000 mg | DELAYED_RELEASE_TABLET | Freq: Every day | ORAL | Status: DC
  Administered 2020-09-14 – 2020-09-20 (×5): 10 mg via ORAL
  Filled 2020-09-13 (×6): qty 2

## 2020-09-13 MED ORDER — MIDAZOLAM HCL (PF) 10 MG/2ML IJ SOLN
INTRAMUSCULAR | Status: AC
  Filled 2020-09-13: qty 2

## 2020-09-13 MED ORDER — FENTANYL CITRATE (PF) 250 MCG/5ML IJ SOLN
INTRAMUSCULAR | Status: AC
  Filled 2020-09-13: qty 5

## 2020-09-13 MED ORDER — FAMOTIDINE IN NACL 20-0.9 MG/50ML-% IV SOLN
20.0000 mg | Freq: Two times a day (BID) | INTRAVENOUS | Status: AC
  Administered 2020-09-13 (×2): 20 mg via INTRAVENOUS
  Filled 2020-09-13 (×2): qty 50

## 2020-09-13 MED ORDER — POTASSIUM CHLORIDE 10 MEQ/50ML IV SOLN: 10.0000 meq | INTRAVENOUS | Status: AC

## 2020-09-13 MED ORDER — PLASMA-LYTE A IV SOLN
INTRAVENOUS | Status: DC | PRN
  Administered 2020-09-13: 1000 mL

## 2020-09-13 MED ORDER — ONDANSETRON HCL 4 MG/2ML IJ SOLN
4.0000 mg | Freq: Four times a day (QID) | INTRAMUSCULAR | Status: DC | PRN
  Administered 2020-09-13 – 2020-09-19 (×5): 4 mg via INTRAVENOUS
  Filled 2020-09-13 (×6): qty 2

## 2020-09-13 MED ORDER — PROPOFOL 10 MG/ML IV BOLUS
INTRAVENOUS | Status: DC | PRN
Start: 2020-09-13 — End: 2020-09-13
  Administered 2020-09-13 (×3): 20 mg via INTRAVENOUS
  Administered 2020-09-13: 60 mg via INTRAVENOUS

## 2020-09-13 MED ORDER — TRAMADOL HCL 50 MG PO TABS: 50.0000 mg | ORAL_TABLET | ORAL | Status: DC | PRN

## 2020-09-13 MED ORDER — CHLORHEXIDINE GLUCONATE CLOTH 2 % EX PADS
6.0000 | MEDICATED_PAD | Freq: Every day | CUTANEOUS | Status: DC
  Administered 2020-09-14: 6 via TOPICAL

## 2020-09-13 MED ORDER — THROMBIN 20000 UNITS EX SOLR
OROMUCOSAL | Status: DC | PRN
  Administered 2020-09-13: 12 mL via TOPICAL

## 2020-09-13 MED ORDER — ASPIRIN 81 MG PO CHEW: 324.0000 mg | CHEWABLE_TABLET | Freq: Every day | ORAL | Status: DC

## 2020-09-13 MED ORDER — METOPROLOL TARTRATE 12.5 MG HALF TABLET
12.5000 mg | ORAL_TABLET | Freq: Two times a day (BID) | ORAL | Status: DC
Start: 2020-09-13 — End: 2020-09-19
  Administered 2020-09-16 – 2020-09-18 (×6): 12.5 mg via ORAL
  Filled 2020-09-13 (×6): qty 1

## 2020-09-13 MED ORDER — METOPROLOL TARTRATE 25 MG/10 ML ORAL SUSPENSION: 12.5000 mg | Freq: Two times a day (BID) | ORAL | Status: DC

## 2020-09-13 MED ORDER — HEPARIN SODIUM (PORCINE) 1000 UNIT/ML IJ SOLN
INTRAMUSCULAR | Status: AC
  Filled 2020-09-13: qty 1

## 2020-09-13 MED ORDER — MORPHINE SULFATE (PF) 2 MG/ML IV SOLN
1.0000 mg | INTRAVENOUS | Status: DC | PRN
  Administered 2020-09-13: 2 mg via INTRAVENOUS
  Administered 2020-09-13 – 2020-09-14 (×3): 4 mg via INTRAVENOUS
  Administered 2020-09-14: 2 mg via INTRAVENOUS
  Administered 2020-09-14: 4 mg via INTRAVENOUS
  Filled 2020-09-13: qty 2
  Filled 2020-09-13: qty 1
  Filled 2020-09-13: qty 2
  Filled 2020-09-13: qty 1
  Filled 2020-09-13: qty 2
  Filled 2020-09-13: qty 1
  Filled 2020-09-13: qty 2

## 2020-09-13 MED ORDER — BISACODYL 10 MG RE SUPP
10.0000 mg | Freq: Every day | RECTAL | Status: DC
  Administered 2020-09-15 – 2020-09-17 (×2): 10 mg via RECTAL
  Filled 2020-09-13 (×2): qty 1

## 2020-09-13 MED ORDER — HEPARIN SODIUM (PORCINE) 1000 UNIT/ML IJ SOLN
INTRAMUSCULAR | Status: DC | PRN
  Administered 2020-09-13: 22000 [IU] via INTRAVENOUS

## 2020-09-13 MED ORDER — THROMBIN 20000 UNITS EX SOLR
CUTANEOUS | Status: DC | PRN
  Administered 2020-09-13: 20000 [IU] via TOPICAL

## 2020-09-13 MED ORDER — PROPOFOL 10 MG/ML IV BOLUS
INTRAVENOUS | Status: AC
  Filled 2020-09-13: qty 20

## 2020-09-13 MED ORDER — PHENYLEPHRINE HCL-NACL 20-0.9 MG/250ML-% IV SOLN
0.0000 ug/min | INTRAVENOUS | Status: DC
Start: 2020-09-13 — End: 2020-09-15

## 2020-09-13 MED ORDER — NITROGLYCERIN IN D5W 200-5 MCG/ML-% IV SOLN
0.0000 ug/min | INTRAVENOUS | Status: DC
Start: 2020-09-13 — End: 2020-09-15

## 2020-09-13 MED ORDER — OXYCODONE HCL 5 MG PO TABS
5.0000 mg | ORAL_TABLET | ORAL | Status: DC | PRN
  Administered 2020-09-14: 5 mg via ORAL
  Administered 2020-09-14: 10 mg via ORAL
  Administered 2020-09-18 – 2020-09-23 (×6): 5 mg via ORAL
  Administered 2020-09-24: 10 mg via ORAL
  Administered 2020-09-25 – 2020-09-26 (×4): 5 mg via ORAL
  Administered 2020-09-26 – 2020-09-27 (×2): 10 mg via ORAL
  Administered 2020-09-29: 5 mg via ORAL
  Administered 2020-09-29: 10 mg via ORAL
  Administered 2020-09-29 (×2): 5 mg via ORAL
  Administered 2020-10-01: 10 mg via ORAL
  Administered 2020-10-01: 5 mg via ORAL
  Administered 2020-10-01 – 2020-10-04 (×6): 10 mg via ORAL
  Filled 2020-09-13 (×2): qty 2
  Filled 2020-09-13: qty 1
  Filled 2020-09-13: qty 2
  Filled 2020-09-13: qty 1
  Filled 2020-09-13: qty 2
  Filled 2020-09-13 (×2): qty 1
  Filled 2020-09-13 (×3): qty 2
  Filled 2020-09-13 (×3): qty 1
  Filled 2020-09-13: qty 2
  Filled 2020-09-13 (×2): qty 1
  Filled 2020-09-13: qty 2
  Filled 2020-09-13: qty 1
  Filled 2020-09-13: qty 2
  Filled 2020-09-13 (×2): qty 1
  Filled 2020-09-13: qty 2
  Filled 2020-09-13 (×2): qty 1
  Filled 2020-09-13: qty 2

## 2020-09-13 MED ORDER — SODIUM CHLORIDE 0.9 % IV SOLN: INTRAVENOUS | Status: DC

## 2020-09-13 MED ORDER — ALBUMIN HUMAN 5 % IV SOLN: INTRAVENOUS | Status: DC | PRN

## 2020-09-13 MED ORDER — PANTOPRAZOLE SODIUM 40 MG PO TBEC
40.0000 mg | DELAYED_RELEASE_TABLET | Freq: Every day | ORAL | Status: DC
  Administered 2020-09-17 – 2020-09-18 (×2): 40 mg via ORAL
  Filled 2020-09-13 (×2): qty 1

## 2020-09-13 MED ORDER — PROTAMINE SULFATE 10 MG/ML IV SOLN
INTRAVENOUS | Status: DC | PRN
  Administered 2020-09-13: 190 mg via INTRAVENOUS
  Administered 2020-09-13: 30 mg via INTRAVENOUS

## 2020-09-13 MED ORDER — ACETAMINOPHEN 160 MG/5ML PO SOLN
1000.0000 mg | Freq: Four times a day (QID) | ORAL | Status: AC
  Administered 2020-09-15 – 2020-09-18 (×2): 1000 mg
  Filled 2020-09-13 (×2): qty 40.6

## 2020-09-13 MED ORDER — SODIUM CHLORIDE 0.9% FLUSH
3.0000 mL | Freq: Two times a day (BID) | INTRAVENOUS | Status: DC
Start: 2020-09-14 — End: 2020-09-19
  Administered 2020-09-14 – 2020-09-18 (×8): 3 mL via INTRAVENOUS

## 2020-09-13 MED ORDER — SODIUM CHLORIDE (PF) 0.9 % IJ SOLN
INTRAMUSCULAR | Status: AC
  Filled 2020-09-13: qty 10

## 2020-09-13 MED ORDER — ALBUMIN HUMAN 5 % IV SOLN: 250.0000 mL | INTRAVENOUS | Status: DC | PRN

## 2020-09-13 MED ORDER — INSULIN REGULAR(HUMAN) IN NACL 100-0.9 UT/100ML-% IV SOLN: INTRAVENOUS | Status: DC

## 2020-09-13 MED ORDER — DOCUSATE SODIUM 100 MG PO CAPS
200.0000 mg | ORAL_CAPSULE | Freq: Every day | ORAL | Status: DC
  Administered 2020-09-16 – 2020-09-24 (×6): 200 mg via ORAL
  Filled 2020-09-13 (×6): qty 2

## 2020-09-13 MED ORDER — MIDAZOLAM HCL 5 MG/5ML IJ SOLN
INTRAMUSCULAR | Status: DC | PRN
  Administered 2020-09-13 (×2): 1 mg via INTRAVENOUS

## 2020-09-13 MED ORDER — VANCOMYCIN HCL IN DEXTROSE 1-5 GM/200ML-% IV SOLN
1000.0000 mg | Freq: Once | INTRAVENOUS | Status: AC
  Administered 2020-09-13: 1000 mg via INTRAVENOUS
  Filled 2020-09-13: qty 200

## 2020-09-13 MED ORDER — METOPROLOL TARTRATE 5 MG/5ML IV SOLN
2.5000 mg | INTRAVENOUS | Status: DC | PRN
  Administered 2020-09-18: 2.5 mg via INTRAVENOUS
  Administered 2020-09-19: 5 mg via INTRAVENOUS
  Administered 2020-09-19: 2.5 mg via INTRAVENOUS
  Administered 2020-09-19 (×2): 5 mg via INTRAVENOUS
  Filled 2020-09-13 (×5): qty 5

## 2020-09-13 MED ORDER — ASPIRIN EC 325 MG PO TBEC
325.0000 mg | DELAYED_RELEASE_TABLET | Freq: Every day | ORAL | Status: DC
Start: 2020-09-14 — End: 2020-09-23
  Administered 2020-09-14 – 2020-09-23 (×7): 325 mg via ORAL
  Filled 2020-09-13 (×8): qty 1

## 2020-09-13 MED ORDER — SUCCINYLCHOLINE CHLORIDE 200 MG/10ML IV SOSY
PREFILLED_SYRINGE | INTRAVENOUS | Status: AC
  Filled 2020-09-13: qty 10

## 2020-09-13 MED ORDER — PHENYLEPHRINE HCL (PRESSORS) 10 MG/ML IV SOLN: INTRAVENOUS | Status: DC | PRN

## 2020-09-13 MED ORDER — LACTATED RINGERS IV SOLN: INTRAVENOUS | Status: DC

## 2020-09-13 MED ORDER — ACETAMINOPHEN 160 MG/5ML PO SOLN
650.0000 mg | Freq: Once | ORAL | Status: AC
  Administered 2020-09-18: 650 mg
  Filled 2020-09-13: qty 20.3

## 2020-09-13 MED ORDER — CHLORHEXIDINE GLUCONATE 0.12 % MT SOLN
15.0000 mL | OROMUCOSAL | Status: AC
  Administered 2020-09-13: 15 mL via OROMUCOSAL
  Filled 2020-09-13: qty 15

## 2020-09-13 SURGICAL SUPPLY — 94 items
BAG DECANTER FOR FLEXI CONT (MISCELLANEOUS) ×3 IMPLANT
BLADE CLIPPER SURG (BLADE) ×3 IMPLANT
BLADE STERNUM SYSTEM 6 (BLADE) ×3 IMPLANT
BNDG ELASTIC 4X5.8 VLCR STR LF (GAUZE/BANDAGES/DRESSINGS) ×3 IMPLANT
BNDG ELASTIC 6X5.8 VLCR STR LF (GAUZE/BANDAGES/DRESSINGS) ×3 IMPLANT
BNDG GAUZE ELAST 4 BULKY (GAUZE/BANDAGES/DRESSINGS) ×3 IMPLANT
CANISTER SUCT 3000ML PPV (MISCELLANEOUS) ×3 IMPLANT
CATH ROBINSON RED A/P 18FR (CATHETERS) ×7 IMPLANT
CATH THORACIC 28FR (CATHETERS) ×3 IMPLANT
CATH THORACIC 36FR (CATHETERS) ×3 IMPLANT
CATH THORACIC 36FR RT ANG (CATHETERS) ×3 IMPLANT
CLIP TI WIDE RED SMALL 24 (CLIP) ×1 IMPLANT
CLIP VESOCCLUDE MED 24/CT (CLIP) IMPLANT
CLIP VESOCCLUDE SM WIDE 24/CT (CLIP) IMPLANT
CONTAINER PROTECT SURGISLUSH (MISCELLANEOUS) ×4 IMPLANT
DERMABOND ADVANCED (GAUZE/BANDAGES/DRESSINGS) ×1
DERMABOND ADVANCED .7 DNX12 (GAUZE/BANDAGES/DRESSINGS) IMPLANT
DRAPE CARDIOVASCULAR INCISE (DRAPES) ×1
DRAPE SRG 135X102X78XABS (DRAPES) ×2 IMPLANT
DRAPE WARM FLUID 44X44 (DRAPES) ×3 IMPLANT
DRSG COVADERM 4X14 (GAUZE/BANDAGES/DRESSINGS) ×3 IMPLANT
ELECT CAUTERY BLADE 6.4 (BLADE) ×3 IMPLANT
ELECT REM PT RETURN 9FT ADLT (ELECTROSURGICAL) ×6 IMPLANT
ELECTRODE REM PT RTRN 9FT ADLT (ELECTROSURGICAL) ×4 IMPLANT
FELT TEFLON 1X6 (MISCELLANEOUS) ×5 IMPLANT
GAUZE SPONGE 4X4 12PLY STRL (GAUZE/BANDAGES/DRESSINGS) ×6 IMPLANT
GAUZE SPONGE 4X4 12PLY STRL LF (GAUZE/BANDAGES/DRESSINGS) ×2 IMPLANT
GLOVE SURG ENC MOIS LTX SZ6 (GLOVE) IMPLANT
GLOVE SURG ENC MOIS LTX SZ6.5 (GLOVE) IMPLANT
GLOVE SURG ENC MOIS LTX SZ7 (GLOVE) IMPLANT
GLOVE SURG ENC MOIS LTX SZ7.5 (GLOVE) IMPLANT
GLOVE SURG MICRO LTX SZ7 (GLOVE) ×6 IMPLANT
GLOVE SURG MICRO LTX SZ7.5 (GLOVE) ×2 IMPLANT
GLOVE SURG ORTHO LTX SZ7.5 (GLOVE) IMPLANT
GLOVE SURG UNDER POLY LF SZ6 (GLOVE) IMPLANT
GLOVE SURG UNDER POLY LF SZ6.5 (GLOVE) IMPLANT
GLOVE SURG UNDER POLY LF SZ7 (GLOVE) IMPLANT
GOWN STRL REUS W/ TWL LRG LVL3 (GOWN DISPOSABLE) ×8 IMPLANT
GOWN STRL REUS W/ TWL XL LVL3 (GOWN DISPOSABLE) ×2 IMPLANT
GOWN STRL REUS W/TWL LRG LVL3 (GOWN DISPOSABLE) ×4
GOWN STRL REUS W/TWL XL LVL3 (GOWN DISPOSABLE) ×1
HEMOSTAT POWDER SURGIFOAM 1G (HEMOSTASIS) ×9 IMPLANT
HEMOSTAT SURGICEL 2X14 (HEMOSTASIS) ×3 IMPLANT
KIT BASIN OR (CUSTOM PROCEDURE TRAY) ×3 IMPLANT
KIT CATH CPB BARTLE (MISCELLANEOUS) ×3 IMPLANT
KIT SUCTION CATH 14FR (SUCTIONS) ×3 IMPLANT
KIT TURNOVER KIT B (KITS) ×3 IMPLANT
KIT VASOVIEW HEMOPRO 2 VH 4000 (KITS) ×3 IMPLANT
NS IRRIG 1000ML POUR BTL (IV SOLUTION) ×15 IMPLANT
PACK E OPEN HEART (SUTURE) ×3 IMPLANT
PACK OPEN HEART (CUSTOM PROCEDURE TRAY) ×3 IMPLANT
PAD ARMBOARD 7.5X6 YLW CONV (MISCELLANEOUS) ×6 IMPLANT
PAD ELECT DEFIB RADIOL ZOLL (MISCELLANEOUS) ×3 IMPLANT
PENCIL BUTTON HOLSTER BLD 10FT (ELECTRODE) ×3 IMPLANT
POSITIONER HEAD DONUT 9IN (MISCELLANEOUS) ×3 IMPLANT
PUNCH AORTIC ROTATE 4.5MM 8IN (MISCELLANEOUS) ×3 IMPLANT
SET MPS 3-ND DEL (MISCELLANEOUS) ×1 IMPLANT
SPONGE T-LAP 18X18 ~~LOC~~+RFID (SPONGE) ×1 IMPLANT
SPONGE T-LAP 4X18 ~~LOC~~+RFID (SPONGE) ×4 IMPLANT
SUPPORT HEART JANKE-BARRON (MISCELLANEOUS) ×3 IMPLANT
SUT BONE WAX W31G (SUTURE) ×3 IMPLANT
SUT MNCRL AB 4-0 PS2 18 (SUTURE) IMPLANT
SUT PROLENE 3 0 SH DA (SUTURE) IMPLANT
SUT PROLENE 3 0 SH1 36 (SUTURE) ×4 IMPLANT
SUT PROLENE 4 0 RB 1 (SUTURE) ×2
SUT PROLENE 4 0 SH DA (SUTURE) IMPLANT
SUT PROLENE 4-0 RB1 .5 CRCL 36 (SUTURE) IMPLANT
SUT PROLENE 5 0 C 1 36 (SUTURE) IMPLANT
SUT PROLENE 6 0 C 1 30 (SUTURE) IMPLANT
SUT PROLENE 7 0 BV 1 (SUTURE) IMPLANT
SUT PROLENE 7 0 BV1 MDA (SUTURE) ×3 IMPLANT
SUT PROLENE 8 0 BV175 6 (SUTURE) IMPLANT
SUT SILK  1 MH (SUTURE)
SUT SILK 1 MH (SUTURE) IMPLANT
SUT SILK 2 0 SH (SUTURE) IMPLANT
SUT STEEL 6MS V (SUTURE) ×2 IMPLANT
SUT VIC AB 1 CTX 36 (SUTURE) ×2
SUT VIC AB 1 CTX36XBRD ANBCTR (SUTURE) ×4 IMPLANT
SUT VIC AB 2-0 CT1 27 (SUTURE) ×1
SUT VIC AB 2-0 CT1 TAPERPNT 27 (SUTURE) IMPLANT
SUT VIC AB 2-0 CTX 27 (SUTURE) IMPLANT
SUT VIC AB 3-0 SH 27 (SUTURE)
SUT VIC AB 3-0 SH 27X BRD (SUTURE) IMPLANT
SUT VIC AB 3-0 X1 27 (SUTURE) IMPLANT
SUT VICRYL 4-0 PS2 18IN ABS (SUTURE) IMPLANT
SYSTEM SAHARA CHEST DRAIN ATS (WOUND CARE) ×3 IMPLANT
TAPE CLOTH SURG 4X10 WHT LF (GAUZE/BANDAGES/DRESSINGS) ×1 IMPLANT
TAPE PAPER 2X10 WHT MICROPORE (GAUZE/BANDAGES/DRESSINGS) ×1 IMPLANT
TOWEL GREEN STERILE (TOWEL DISPOSABLE) ×3 IMPLANT
TOWEL GREEN STERILE FF (TOWEL DISPOSABLE) ×3 IMPLANT
TRAY FOLEY SLVR 14FR TEMP STAT (SET/KITS/TRAYS/PACK) ×1 IMPLANT
TUBING LAP HI FLOW INSUFFLATIO (TUBING) ×3 IMPLANT
UNDERPAD 30X36 HEAVY ABSORB (UNDERPADS AND DIAPERS) ×3 IMPLANT
WATER STERILE IRR 1000ML POUR (IV SOLUTION) ×6 IMPLANT

## 2020-09-13 NOTE — Transfer of Care (Signed)
Immediate Anesthesia Transfer of Care Note  Patient: Jodi Bell  Procedure(s) Performed: CORONARY ARTERY BYPASS GRAFTING (CABG), ON PUMP, TIMES TWO, USING LEFT INTERNAL MAMMARY ARTERY AND RIGHT ENDOSCOPICALLY HARVESTED GREATER SAPHENOUS VEIN (Chest) TRANSESOPHAGEAL ECHOCARDIOGRAM (TEE)  Patient Location: ICU  Anesthesia Type:General  Level of Consciousness: Patient remains intubated per anesthesia plan  Airway & Oxygen Therapy: Patient remains intubated per anesthesia plan and Patient placed on Ventilator (see vital sign flow sheet for setting)  Post-op Assessment: Report given to RN  Post vital signs: Reviewed and stable  Last Vitals:  Vitals Value Taken Time  BP 109/46   Temp    Pulse 72 09/13/20 1235  Resp 12 09/13/20 1235  SpO2 97 % 09/13/20 1235    Last Pain:  Vitals:   09/13/20 0525  TempSrc: Oral  PainSc:       Patients Stated Pain Goal: 2 ([id number])  Complications: No notable events documented.

## 2020-09-13 NOTE — Progress Notes (Signed)
      [address]       [place], [address]             [phone]      S/p CABG x 2  Intubated, sedated  BP (!) 119/51   Pulse 65   Temp (!) 96.08 F (35.6 C)   Resp 14   Ht '5\' 2"'$  (1.575 m)   Wt 61.3 kg   SpO2 100%   BMI 24.73 kg/m  28/14 CI= 2.2 Off neo   Intake/Output Summary (Last 24 hours) at 09/13/2020 1721 Last data filed at 09/13/2020 1700 Gross per 24 hour  Intake 4936.99 ml  Output 3873 ml  Net 1063.99 ml   Hct= 24, PLT 87K  Remo Lipps C. Roxan Hockey, MD [facility] [phone]

## 2020-09-13 NOTE — Progress Notes (Addendum)
Dr. Gilford Raid, [facility] surgeon, has taken over the care of Ms. Jodi Bell.

## 2020-09-13 NOTE — Interval H&P Note (Signed)
History and Physical Interval Note:  09/13/2020 6:22 AM  Jodi Bell  has presented today for surgery, with the diagnosis of CAD.  The various methods of treatment have been discussed with the patient and family. After consideration of risks, benefits and other options for treatment, the patient has consented to  Procedure(s): CORONARY ARTERY BYPASS GRAFTING (CABG) (N/A) TRANSESOPHAGEAL ECHOCARDIOGRAM (TEE) (N/A) as a surgical intervention.  The patient's history has been reviewed, patient examined, no change in status, stable for surgery.  I have reviewed the patient's chart and labs.  Questions were answered to the patient's satisfaction.     Gaye Pollack

## 2020-09-13 NOTE — Progress Notes (Signed)
  Echocardiogram Echocardiogram Transesophageal has been performed.  Jodi Bell M 09/13/2020, 2:26 PM 

## 2020-09-13 NOTE — Brief Op Note (Signed)
09/08/2020 - 09/13/2020  11:42 AM  PATIENT:  Jodi Bell  60 y.o. female  PRE-OPERATIVE DIAGNOSIS:  Coronary Artery Disease  POST-OPERATIVE DIAGNOSIS:  Coronary Artery Disease  PROCEDURE:  Procedure(s): CORONARY ARTERY BYPASS GRAFTING (CABG), ON PUMP, TIMES TWO, USING LEFT INTERNAL MAMMARY ARTERY AND RIGHT ENDOSCOPICALLY HARVESTED GREATER SAPHENOUS VEIN (N/A) TRANSESOPHAGEAL ECHOCARDIOGRAM (TEE) (N/A) LIMA-LAD SVG-PDA EVH 40 MINUTES  SURGEON:  Surgeon(s) and Role:    * Bartle, Fernande Boyden, MD - Primary  PHYSICIAN ASSISTANT: Antavion Bartoszek PA-C  ASSISTANTS: STAFF RNFA   ANESTHESIA:   general  EBL:  1000 mL   BLOOD ADMINISTERED:none  DRAINS:  LEFT PLEURAL AND MEDIASTINAL CHEST TUBES    LOCAL MEDICATIONS USED:  NONE  SPECIMEN:  No Specimen  DISPOSITION OF SPECIMEN:  N/A  COUNTS:  YES  TOURNIQUET:  * No tourniquets in log *  DICTATION: .Dragon Dictation  PLAN OF CARE: Admit to inpatient   PATIENT DISPOSITION:  ICU - intubated and hemodynamically stable.   Delay start of Pharmacological VTE agent (>24hrs) due to surgical blood loss or risk of bleeding: yes  COMPLICATIONS: NO KNOWN

## 2020-09-13 NOTE — Progress Notes (Signed)
[facility] Progress Note   Subjective: Intubated and sedated. CABG this morning w/ bypass graft x2. Surgery went well. Remains intubated.  Objective Vitals:   09/13/20 0726 09/13/20 1000 09/13/20 1235 09/13/20 1300  BP:      Pulse: 63  72 69  Resp: '10  12 14  '$ Temp:    (!) 95.54 F (35.3 C)  TempSrc:    Core  SpO2: 100%  97% 100%  Weight:      Height:  '5\' 2"'$  (1.575 m)     Physical Exam General: lying in bed, intubated, sedated Heart: Normal rate, murmur not audible at this time Lungs: Bilateral chest rise, ventilated, comfortable Abdomen:NABS, no distention Extremities:No LE edema Dialysis Access: L AVF + T/B   Additional Objective Labs: Basic Metabolic Panel: Recent Labs  Lab 09/12/20 0228 09/12/20 1946 09/13/20 0333 09/13/20 0807 09/13/20 0921 09/13/20 0948 09/13/20 1014 09/13/20 1018 09/13/20 1025 09/13/20 1044  NA 129* 132* 130*   < > 132* 130*   < > 134* 133* 135  K 4.3 3.4* 3.3*   < > 3.2* 3.2*   < > 4.7 4.3 4.4  CL 92* 97* 92*   < > 95* 96*  --   --  97*  --   CO2 '26 26 29  '$ --   --   --   --   --   --   --   GLUCOSE 122* 153* 111*   < > 101* 103*  --   --  112*  --   BUN 22* 7 <5*   < > 9 9  --   --  8  --   CREATININE 5.59* 2.57* 3.13*   < > 3.30* 3.30*  --   --  3.10*  --   CALCIUM 10.4* 9.3 9.8  --   --   --   --   --   --   --   PHOS  --  1.5*  --   --   --   --   --   --   --   --    < > = values in this interval not displayed.   Liver Function Tests: Recent Labs  Lab 09/12/20 1946 09/13/20 0333  AST  --  13*  ALT  --  9  ALKPHOS  --  68  BILITOT  --  0.4  PROT  --  5.0*  ALBUMIN 2.5* 2.2*   No results for input(s): LIPASE, AMYLASE in the last 168 hours. CBC: Recent Labs  Lab 09/08/20 1221 09/09/20 0300 09/11/20 0358 09/12/20 0228 09/12/20 1946 09/13/20 0333 09/13/20 0807 09/13/20 1044 09/13/20 1048 09/13/20 1247  WBC 7.1   < > 5.9 5.2 5.8 5.4  --   --   --  7.0  NEUTROABS 4.7  --   --   --   --   --   --    --   --   --   HGB 9.2*   < > 7.8* 8.1* 8.6* 7.9*   < > 5.8* 6.8* 8.1*  HCT 28.1*   < > 23.7* 24.0* 25.0* 23.7*   < > 17.0* 20.0* 24.5*  MCV 101.1*   < > 97.5 96.8 96.2 97.1  --   --   --  90.7  PLT 243   < > 175 170 190 167  --   --  119* 87*   < > = values in this interval not displayed.   Blood  Culture    Component Value Date/Time   SDES BLOOD RIGHT WRIST 09/09/2020 1703   SPECREQUEST  09/09/2020 1703    BOTTLES DRAWN AEROBIC ONLY Blood Culture results may not be optimal due to an inadequate volume of blood received in culture bottles   CULT  09/09/2020 1703    NO GROWTH 4 DAYS Performed at [facility], [place] [address], [place], [place] [address]    REPTSTATUS PENDING 09/09/2020 1703    Cardiac Enzymes: No results for input(s): CKTOTAL, CKMB, CKMBINDEX, TROPONINI in the last 168 hours. CBG: Recent Labs  Lab 09/12/20 1107 09/12/20 1549 09/12/20 2053 09/13/20 0602 09/13/20 1249  GLUCAP 116* 126* 143* 107* 80   Iron Studies: No results for input(s): IRON, TIBC, TRANSFERRIN, FERRITIN in the last 72 hours. '@lablastinr3'$ @ Studies/Results: DG Chest Port 1 View  Result Date: 09/13/2020 CLINICAL DATA:  Postoperative, open-heart surgery EXAM: PORTABLE CHEST 1 VIEW COMPARISON:  03/12/2020 FINDINGS: Interval postoperative findings of median sternotomy with extensive support apparatus including endotracheal tube, esophagogastric tube, left-sided chest and mediastinal drainage tubes, right neck pulmonary vascular catheter, tip projecting over the pulmonary outflow tract. No pneumothorax. Cardiomegaly with mild, diffuse bilateral interstitial pulmonary opacity. IMPRESSION: 1. Interval postoperative findings of median sternotomy with extensive support apparatus, appropriately positioned. 2. Cardiomegaly with mild, diffuse bilateral interstitial pulmonary opacity, likely edema. Electronically Signed   By: Eddie Candle M.D.   On: 09/13/2020 13:23   VAS US DOPPLER PRE CABG  Result Date:  09/11/2020 PREOPERATIVE VASCULAR EVALUATION Patient Name:  Jodi Bell  Date of Exam:   09/11/2020 Medical Rec #: [id number]         Accession #:    [id number] Date of Birth: [birth year]          Patient Gender: F Patient Age:   60Y Exam Location:  [facility] Procedure:      VAS US DOPPLER PRE CABG Referring Phys: [place] --------------------------------------------------------------------------------  Indications:      Pre-CABG. Risk Factors:     Hypertension, Diabetes, coronary artery disease. Comparison Study: no prior Performing Technologist: Archie Patten RVS  Examination Guidelines: A complete evaluation includes B-mode imaging, spectral Doppler, color Doppler, and power Doppler as needed of all accessible portions of each vessel. Bilateral testing is considered an integral part of a complete examination. Limited examinations for reoccurring indications may be performed as noted.  Right Carotid Findings: +----------+--------+--------+--------+------------+--------+           PSV cm/sEDV cm/sStenosisDescribe    Comments +----------+--------+--------+--------+------------+--------+ CCA Prox  78      11              heterogenous         +----------+--------+--------+--------+------------+--------+ CCA Distal70      11              heterogenous         +----------+--------+--------+--------+------------+--------+ ICA Prox  156     31      1-39%   heterogenoustortuous +----------+--------+--------+--------+------------+--------+ ICA Distal83      17                                   +----------+--------+--------+--------+------------+--------+ ECA       92                                           +----------+--------+--------+--------+------------+--------+ +----------+--------+-------+--------+------------+  PSV cm/sEDV cmsDescribeArm Pressure +----------+--------+-------+--------+------------+ Subclavian102                                  +----------+--------+-------+--------+------------+ +---------+--------+--+--------+-+---------+ VertebralPSV cm/s24EDV cm/s6Antegrade +---------+--------+--+--------+-+---------+ Left Carotid Findings: +----------+--------+--------+--------+------------+--------+           PSV cm/sEDV cm/sStenosisDescribe    Comments +----------+--------+--------+--------+------------+--------+ CCA Prox  109     15              heterogenous         +----------+--------+--------+--------+------------+--------+ CCA Distal59      13              heterogenous         +----------+--------+--------+--------+------------+--------+ ICA Prox  79      23      1-39%   heterogenous         +----------+--------+--------+--------+------------+--------+ ICA Distal62      15                                   +----------+--------+--------+--------+------------+--------+ ECA       128                                          +----------+--------+--------+--------+------------+--------+ +----------+--------+--------+--------+------------+ SubclavianPSV cm/sEDV cm/sDescribeArm Pressure +----------+--------+--------+--------+------------+           140     46                           +----------+--------+--------+--------+------------+ +---------+--------+--+--------+-+---------+ VertebralPSV cm/s36EDV cm/s8Antegrade +---------+--------+--+--------+-+---------+  ABI Findings: +--------+------------------+-----+---------+--------+ Right   Rt Pressure (mmHg)IndexWaveform Comment  +--------+------------------+-----+---------+--------+ Brachial                       triphasic         +--------+------------------+-----+---------+--------+ ATA                            triphasic         +--------+------------------+-----+---------+--------+ PTA                            triphasic         +--------+------------------+-----+---------+--------+  +--------+------------------+-----+---------+-------+ Left    Lt Pressure (mmHg)IndexWaveform Comment +--------+------------------+-----+---------+-------+ Brachial                       triphasicfistula +--------+------------------+-----+---------+-------+ ATA                            triphasic        +--------+------------------+-----+---------+-------+ PTA                            triphasic        +--------+------------------+-----+---------+-------+  Right Doppler Findings: +--------+--------+-----+---------+--------+ Site    PressureIndexDoppler  Comments +--------+--------+-----+---------+--------+ Brachial             triphasic         +--------+--------+-----+---------+--------+ Radial               triphasic         +--------+--------+-----+---------+--------+ Ulnar  triphasic         +--------+--------+-----+---------+--------+  Left Doppler Findings: +--------+--------+-----+---------+--------+ Site    PressureIndexDoppler  Comments +--------+--------+-----+---------+--------+ Brachial             triphasicfistula  +--------+--------+-----+---------+--------+ Radial               triphasic         +--------+--------+-----+---------+--------+ Ulnar                biphasic          +--------+--------+-----+---------+--------+  Summary: Right Carotid: Velocities in the right ICA are consistent with a 1-39% stenosis. Left Carotid: Velocities in the left ICA are consistent with a 1-39% stenosis. Vertebrals: Bilateral vertebral arteries demonstrate antegrade flow. Right Upper Extremity: Doppler waveforms decrease 50% with right radial compression. Doppler waveforms remain within normal limits with right ulnar compression. Left Upper Extremity: Unable to obtain Allens test- Fistula.  Electronically signed by Deitra Mayo MD on 09/11/2020 at 5:00:08 PM.    Final    Medications:  sodium chloride 20 mL/hr at 09/13/20  1304   [START ON 09/14/2020] sodium chloride     sodium chloride     albumin human      ceFAZolin (ANCEF) IV     dexmedetomidine (PRECEDEX) IV infusion 0.7 mcg/kg/hr (09/13/20 1256)   famotidine (PEPCID) IV 20 mg (09/13/20 1304)   insulin 0.1 Units/hr (09/13/20 1256)   lactated ringers     lactated ringers     lactated ringers     magnesium sulfate     nitroGLYCERIN 0 mcg/min (09/13/20 1257)   phenylephrine (NEO-SYNEPHRINE) Adult infusion 0 mcg/min (09/13/20 1258)   potassium chloride     vancomycin      sodium chloride   Intravenous Once   [START ON 09/14/2020] acetaminophen  1,000 mg Oral Q6H   Or   [START ON 09/14/2020] acetaminophen (TYLENOL) oral liquid 160 mg/5 mL  1,000 mg Per Tube Q6H   acetaminophen (TYLENOL) oral liquid 160 mg/5 mL  650 mg Per Tube Once   Or   acetaminophen  650 mg Rectal Once   allopurinol  100 mg Oral QPM   [START ON 09/14/2020] aspirin EC  325 mg Oral Daily   Or   [START ON 09/14/2020] aspirin  324 mg Per Tube Daily   [START ON 09/14/2020] bisacodyl  10 mg Oral Daily   Or   [START ON 09/14/2020] bisacodyl  10 mg Rectal Daily   calcium acetate  2,001 mg Oral TID WC   chlorhexidine  15 mL Mouth Rinse BID   chlorhexidine  15 mL Mouth/Throat NOW   [START ON 09/14/2020] Chlorhexidine Gluconate Cloth  6 each Topical Q0600   cholecalciferol  1,000 Units Oral QPM   cinacalcet  30 mg Oral Q T,Th,Sa-HD   [START ON 09/14/2020] darbepoetin (ARANESP) injection - DIALYSIS  100 mcg Intravenous Q Fri-HD   [START ON 09/14/2020] docusate sodium  200 mg Oral Daily   ferrous sulfate  325 mg Oral Once per day on Mon Wed Fri   fluticasone  1 spray Each Nare Daily   folic acid  1 mg Oral Daily   mouth rinse  15 mL Mouth Rinse q12n4p   metoprolol tartrate  12.5 mg Oral BID   Or   metoprolol tartrate  12.5 mg Per Tube BID   montelukast  10 mg Oral Q supper   nystatin  5 mL Oral QID   [START ON 09/15/2020] pantoprazole  40 mg Oral Daily   polyethylene  glycol  17 g Oral Daily    polyvinyl alcohol  1 drop Both Eyes TID AC & HS   predniSONE  5 mg Oral Q breakfast   saccharomyces boulardii  250 mg Oral Daily   [START ON 09/14/2020] sodium chloride flush  3 mL Intravenous Q12H   umeclidinium bromide  1 puff Inhalation Daily      Home meds include phoslo 3 ac tid, norvasc, allopurinol, sensipar 30 tiw mwf hd, capares 0.3 tid, lasix 40, neurontin 300 hs prn/ hydralazine 100 tid, imdur 60, januvia, labetalol 400 tid, singulair, renavit, sl ntg prn, olmesartan 300 hs, oxy IR prn, protonix, prednisone 5 qd, spiriva respimat, venotolin hfa, xeljanz 5 hs, prn's     CXR - 7/30 diffuse IS edema  CXR - 7/31 improving but persistent IS edema    OP HD: MWF [facility]  3.5h  2 /2.5 bath  dry wt pend   Hep none   LUA AVF  400/700   na 134  K 4.4 CO2 29   BUN 18  Cr 3.8   Assessment/ Plan Resp distress/ acute hypoxic resp failure - due to vol overload most likely. May be losing body weight. NSTEMI-Went for cardiac cath 09/11/20. 80% stenosis Prox LAD, 90% stenosis mid RCA. Mild AS/MR EF 60%. CT surgery (Dr. Cyndia Bent) consulted. No s/p CABG on 8/4. Mgmt per cardiothoracic surgery ESRD - on HD MWF at [facility] [place] [facility].  Tolerated HD on 8/3 with no issues. Tentatively plan on HD tomorrow. Will hold off on ordering it until seeing her labs in the morning and ensuring she remains stable. HTN/Volume - severe HTN, on 5 BP lowering meds at home. Only metop ordered at this time Anemia - worsened after surgery which is no surprise. Received transfusion. aranesp 134mg ordered to begin tomorrow COPD Gout PAD

## 2020-09-13 NOTE — Anesthesia Procedure Notes (Signed)
Central Venous Catheter Insertion Performed by: Nolon Nations, MD, anesthesiologist Start/End8/05/2020 7:20 AM, 09/13/2020 7:25 AM Patient location: Pre-op. Preanesthetic checklist: patient identified, IV checked, site marked, risks and benefits discussed, surgical consent, monitors and equipment checked, pre-op evaluation, timeout performed and anesthesia consent Hand hygiene performed  and maximum sterile barriers used  PA cath was placed.Swan type:thermodilution Procedure performed without using ultrasound guided technique. Attempts: 1 Patient tolerated the procedure well with no immediate complications.

## 2020-09-13 NOTE — Anesthesia Procedure Notes (Signed)
Central Venous Catheter Insertion Performed by: Nolon Nations, MD, anesthesiologist Start/End8/05/2020 6:58 AM, 09/13/2020 7:20 AM Patient location: Pre-op. Preanesthetic checklist: patient identified, IV checked, site marked, risks and benefits discussed, surgical consent, monitors and equipment checked, pre-op evaluation, timeout performed and anesthesia consent Position: Trendelenburg Lidocaine 1% used for infiltration and patient sedated Hand hygiene performed , maximum sterile barriers used  and Seldinger technique used Catheter size: 8.5 Fr Total catheter length 10. Central line was placed.Sheath introducer Swan type:thermodilution PA Cath depth:50 Procedure performed using ultrasound guided technique. Ultrasound Notes:anatomy identified, needle tip was noted to be adjacent to the nerve/plexus identified, no ultrasound evidence of intravascular and/or intraneural injection and image(s) printed for medical record Attempts: 1 Following insertion, line sutured, dressing applied and Biopatch. Post procedure assessment: blood return through all ports, free fluid flow and no air  Patient tolerated the procedure well with no immediate complications.

## 2020-09-13 NOTE — Anesthesia Procedure Notes (Signed)
Arterial Line Insertion Start/End8/05/2020 7:00 AM, 09/13/2020 7:08 AM Performed by: Barrington Ellison, CRNA, CRNA  Preanesthetic checklist: patient identified, IV checked and risks and benefits discussed Lidocaine 1% used for infiltration Right, radial was placed Catheter size: 20 G Hand hygiene performed  and maximum sterile barriers used  Allen's test indicative of satisfactory collateral circulation Attempts: 1 Procedure performed without using ultrasound guided technique. Following insertion, dressing applied and Biopatch. Post procedure assessment: normal  Patient tolerated the procedure well with no immediate complications.

## 2020-09-13 NOTE — Procedures (Signed)
Extubation Procedure Note  Patient Details:   Name: Jodi Bell DOB: Feb 19, 1960 MRN: [id number]   Airway Documentation:  Airway 8 mm (Active)  Secured at (cm) 23 cm 09/13/20 1935  Measured From Lips 09/13/20 1935  Secured Location Right 09/13/20 1935  Secured By [employer] Tape 09/13/20 1935  Prone position No 09/13/20 1235  Cuff Pressure (cm H2O) Clear OR 27-39 CmH2O 09/13/20 1935  Site Condition Dry 09/13/20 1935   Vent end date: (not recorded) Vent end time: (not recorded)   Evaluation  O2 sats: stable throughout Complications: No apparent complications Patient did tolerate procedure well. Bilateral Breath Sounds: Rhonchi, Diminished   Yes Patient extubated to 5L [name], patient able to state name, no stridor noted, bilateral rhonchi BS. Patient did have a cuff leak. [name], nif -25.   [name] 09/13/2020, 9:18 PM

## 2020-09-13 NOTE — Op Note (Signed)
CARDIOVASCULAR SURGERY OPERATIVE NOTE  09/13/2020  Surgeon:  Gaye Pollack, MD  First Assistant: Jadene Pierini,  PA-C   Preoperative Diagnosis:  Severe multi-vessel coronary artery disease   Postoperative Diagnosis:  Same   Procedure:  Median Sternotomy Extracorporeal circulation 3.   Coronary artery bypass grafting x 2  Left internal mammary artery graft to the LAD SVG to PDA   4.   Endoscopic vein harvest from the right leg   Anesthesia:  General Endotracheal   Clinical History/Surgical Indication:  She presented with recurrent episodes of substernal chest discomfort and shortness of breath. Occurring with exertion and at rest, frequently after smoking cigarettes. Cardiac cath shows diffusely calcified vessels with high grade proximal and mid LAD stenoses and 90% mid RCA. The LCX has mild-moderate non-obstructive disease. Echo shows an LVEF of 60% with mild LVH. The aortic valve is trileaflet, calcified and moderately thickened but the mean gradient is only 24 mm Hg with a DI of 0.47 and an AVA of 1.06 cm2. The leaflets move well. It looks like a mildly stenotic valve visually and I don't think it warrants replacement in this 60 year old with ESRD on HD. A tissue valve would probably give her about the same gradient and would likely calcify quickly. I think she would probably be better off with her own valve and followed. I don't think she is a candidate for a mechanical valve with her comorbidities. I think her coronary disease is most amenable to CABG given the degree of calcification although the calcium extends into the distal LAD and may make sewing a bypass there difficult or impossible. Hopefully there is a spot soft enough to graft. The PDA looks graftable on the right. I would leave the LCX alone. I discussed the operative procedure with the patient  including alternatives, benefits  and risks; including but not limited to bleeding, blood transfusion, infection, stroke, myocardial infarction, graft failure, heart block requiring a permanent pacemaker, organ dysfunction, and death. Her operative risk is increased due to all of her comorbidities. [name] understands and agrees to proceed.  Preparation:  The patient was seen in the preoperative holding area and the correct patient, correct operation were confirmed with the patient after reviewing the medical record and catheterization. The consent was signed by me. Preoperative antibiotics were given. A pulmonary arterial line and radial arterial line were placed by the anesthesia team. The patient was taken back to the operating room and positioned supine on the operating room table. After being placed under general endotracheal anesthesia by the anesthesia team a foley catheter was placed. The neck, chest, abdomen, and both legs were prepped with betadine soap and solution and draped in the usual sterile manner. A surgical time-out was taken and the correct patient and operative procedure were confirmed with the nursing and anesthesia staff.   Cardiopulmonary Bypass:  A median sternotomy was performed. The pericardium was opened in the midline. Right ventricular function appeared normal. The ascending aorta was of normal size and had no palpable plaque. There were no contraindications to aortic cannulation or cross-clamping. The patient was fully systemically heparinized and the ACT was maintained > 400 sec. The proximal aortic arch was cannulated with a 20 F aortic cannula for arterial inflow. Venous cannulation was performed via the right atrial appendage using a two-staged venous cannula. An antegrade cardioplegia/vent cannula was inserted into the mid-ascending aorta. Aortic occlusion was performed with a single cross-clamp. Systemic cooling to 32 degrees Centigrade and topical cooling  of the heart with iced saline were  used. Hyperkalemic antegrade cold blood cardioplegia was used to induce diastolic arrest and was then given at about 20 minute intervals throughout the period of arrest to maintain myocardial temperature at or below 10 degrees centigrade. A temperature probe was inserted into the interventricular septum and an insulating pad was placed in the pericardium.   Left internal mammary artery harvest:  The left side of the sternum was retracted using the Rultract retractor. The left internal mammary artery was harvested as a pedicle graft. All side branches were clipped. It was a medium-sized vessel of good quality with excellent blood flow. It was ligated distally and divided. It was sprayed with topical papaverine solution to prevent vasospasm.   Endoscopic vein harvest:  The right greater saphenous vein was harvested endoscopically through a 2 cm incision medial to the right knee. It was harvested from the upper thigh to below the knee. It was a medium-sized vein of good quality. The side branches were all ligated with 4-0 silk ties.    Coronary arteries:  The coronary arteries were examined.  LAD:  large vessel that was diffusely diseased with calcific plaque except for distally near the apex. LCX:  no significant stenosis on cath. RCA:  Diffusely diseased with calcific plaque. PDA is a large vessel with some proximal plaque but mid to distal vessel is normal.   Grafts:  LIMA to the LAD: 2.5 mm. It was sewn end to side using 8-0 prolene continuous suture. SVG to PDA:  2.5 mm. It was sewn end to side using 7-0 prolene continuous suture.  The proximal vein graft anastomosis was performed to the mid-ascending aorta using continuous 6-0 prolene suture. A graft marker was placed around the proximal anastomosis.   Completion:  The patient was rewarmed to 37 degrees Centigrade. The clamp was removed from the LIMA pedicle and there was rapid warming of the septum and return of ventricular  fibrillation. The crossclamp was removed with a time of 47 minutes. There was spontaneous return of sinus rhythm. The distal and proximal anastomoses were checked for hemostasis. The position of the grafts was satisfactory. Two temporary epicardial pacing wires were placed on the right atrium and two on the right ventricle. The patient was weaned from CPB without difficulty on no inotropes. CPB time was 63 minutes. Cardiac output was 5 LPM. TEE showed normal LV systolic function. Heparin was fully reversed with protamine and the aortic and venous cannulas removed. Hemostasis was achieved. Mediastinal and left pleural drainage tubes were placed. The sternum was closed with double #6 stainless steel wires. The fascia was closed with continuous # 1 vicryl suture. The subcutaneous tissue was closed with 2-0 vicryl continuous suture. The skin was closed with 3-0 vicryl subcuticular suture. All sponge, needle, and instrument counts were reported correct at the end of the case. Dry sterile dressings were placed over the incisions and around the chest tubes which were connected to pleurevac suction. The patient was then transported to the surgical intensive care unit in stable condition.

## 2020-09-13 NOTE — Anesthesia Procedure Notes (Signed)
Procedure Name: Intubation Date/Time: 09/13/2020 7:55 AM Performed by: Barrington Ellison, CRNA Pre-anesthesia Checklist: Patient identified, Emergency Drugs available, Suction available and Patient being monitored Patient Re-evaluated:Patient Re-evaluated prior to induction Oxygen Delivery Method: Circle System Utilized Preoxygenation: Pre-oxygenation with 100% oxygen Induction Type: IV induction Ventilation: Mask ventilation without difficulty and Oral airway inserted - appropriate to patient size Laryngoscope Size: Mac and 3 Grade View: Grade I Tube type: Oral Tube size: 8.0 mm Number of attempts: 1 Airway Equipment and Method: Stylet and Oral airway Placement Confirmation: ETT inserted through vocal cords under direct vision, positive ETCO2 and breath sounds checked- equal and bilateral Secured at: 22 cm Tube secured with: Tape Dental Injury: Teeth and Oropharynx as per pre-operative assessment

## 2020-09-14 ENCOUNTER — Encounter (HOSPITAL_COMMUNITY): Payer: Self-pay | Admitting: Surgery

## 2020-09-14 ENCOUNTER — Inpatient Hospital Stay (HOSPITAL_COMMUNITY): Payer: Medicare PPO

## 2020-09-14 LAB — CBC
HCT: 34.6 % — ABNORMAL LOW (ref 36.0–46.0)
HCT: 34.1 % — ABNORMAL LOW (ref 36.0–46.0)
Hemoglobin: 11.2 g/dL — ABNORMAL LOW (ref 12.0–15.0)
Hemoglobin: 11.2 g/dL — ABNORMAL LOW (ref 12.0–15.0)
MCH: 29.2 pg (ref 26.0–34.0)
MCH: 29.4 pg (ref 26.0–34.0)
MCHC: 32.4 g/dL (ref 30.0–36.0)
MCHC: 32.8 g/dL (ref 30.0–36.0)
MCV: 90.3 fL (ref 80.0–100.0)
MCV: 89.5 fL (ref 80.0–100.0)
Platelets: 128 10*3/uL — ABNORMAL LOW (ref 150–400)
Platelets: 114 10*3/uL — ABNORMAL LOW (ref 150–400)
RBC: 3.83 MIL/uL — ABNORMAL LOW (ref 3.87–5.11)
RBC: 3.81 MIL/uL — ABNORMAL LOW (ref 3.87–5.11)
RDW: 24.6 % — ABNORMAL HIGH (ref 11.5–15.5)
RDW: 24.3 % — ABNORMAL HIGH (ref 11.5–15.5)
WBC: 9 10*3/uL (ref 4.0–10.5)
WBC: 9.8 10*3/uL (ref 4.0–10.5)
nRBC: 0 % (ref 0.0–0.2)
nRBC: 0 % (ref 0.0–0.2)

## 2020-09-14 LAB — BASIC METABOLIC PANEL
Anion gap: 9 (ref 5–15)
Anion gap: 10 (ref 5–15)
BUN: 15 mg/dL (ref 6–20)
BUN: 20 mg/dL (ref 6–20)
CO2: 25 mmol/L (ref 22–32)
CO2: 24 mmol/L (ref 22–32)
Calcium: 9.1 mg/dL (ref 8.9–10.3)
Calcium: 9.8 mg/dL (ref 8.9–10.3)
Chloride: 98 mmol/L (ref 98–111)
Chloride: 98 mmol/L (ref 98–111)
Creatinine, Ser: 3.92 mg/dL — ABNORMAL HIGH (ref 0.44–1.00)
Creatinine, Ser: 4.72 mg/dL — ABNORMAL HIGH (ref 0.44–1.00)
GFR, Estimated: 13 mL/min — ABNORMAL LOW (ref >60)
GFR, Estimated: 10 mL/min — ABNORMAL LOW (ref >60)
Glucose, Bld: 102 mg/dL — ABNORMAL HIGH (ref 70–99)
Glucose, Bld: 76 mg/dL (ref 70–99)
Potassium: 3.7 mmol/L (ref 3.5–5.1)
Potassium: 4.8 mmol/L (ref 3.5–5.1)
Sodium: 132 mmol/L — ABNORMAL LOW (ref 135–145)
Sodium: 132 mmol/L — ABNORMAL LOW (ref 135–145)

## 2020-09-14 LAB — CULTURE, BLOOD (ROUTINE X 2)
Culture: NO GROWTH
Culture: NO GROWTH

## 2020-09-14 LAB — GLUCOSE, CAPILLARY
Glucose-Capillary: 114 mg/dL — ABNORMAL HIGH (ref 70–99)
Glucose-Capillary: 150 mg/dL — ABNORMAL HIGH (ref 70–99)
Glucose-Capillary: 208 mg/dL — ABNORMAL HIGH (ref 70–99)
Glucose-Capillary: 66 mg/dL — ABNORMAL LOW (ref 70–99)
Glucose-Capillary: 69 mg/dL — ABNORMAL LOW (ref 70–99)
Glucose-Capillary: 69 mg/dL — ABNORMAL LOW (ref 70–99)
Glucose-Capillary: 73 mg/dL (ref 70–99)
Glucose-Capillary: 73 mg/dL (ref 70–99)
Glucose-Capillary: 73 mg/dL (ref 70–99)
Glucose-Capillary: 77 mg/dL (ref 70–99)
Glucose-Capillary: 80 mg/dL (ref 70–99)
Glucose-Capillary: 80 mg/dL (ref 70–99)
Glucose-Capillary: 90 mg/dL (ref 70–99)
Glucose-Capillary: 99 mg/dL (ref 70–99)

## 2020-09-14 LAB — MAGNESIUM
Magnesium: 1.8 mg/dL (ref 1.7–2.4)
Magnesium: 2 mg/dL (ref 1.7–2.4)

## 2020-09-14 MED ORDER — CYCLOBENZAPRINE HCL 10 MG PO TABS
10.0000 mg | ORAL_TABLET | Freq: Three times a day (TID) | ORAL | Status: DC | PRN
  Administered 2020-09-14: 10 mg via ORAL
  Filled 2020-09-14 (×2): qty 1

## 2020-09-14 MED ORDER — INSULIN ASPART 100 UNIT/ML IJ SOLN
0.0000 [IU] | INTRAMUSCULAR | Status: DC
  Administered 2020-09-15 (×2): 2 [IU] via SUBCUTANEOUS
  Administered 2020-09-18: 4 [IU] via SUBCUTANEOUS
  Administered 2020-09-18 – 2020-09-21 (×3): 2 [IU] via SUBCUTANEOUS
  Administered 2020-09-22: 4 [IU] via SUBCUTANEOUS

## 2020-09-14 MED ORDER — TRAMADOL HCL 50 MG PO TABS
50.0000 mg | ORAL_TABLET | Freq: Two times a day (BID) | ORAL | Status: DC | PRN
  Administered 2020-09-14: 100 mg via ORAL
  Filled 2020-09-14: qty 2

## 2020-09-14 MED ORDER — POTASSIUM CHLORIDE 10 MEQ/50ML IV SOLN
10.0000 meq | INTRAVENOUS | Status: AC
  Administered 2020-09-14 (×3): 10 meq via INTRAVENOUS

## 2020-09-14 MED ORDER — LORAZEPAM 1 MG PO TABS
1.0000 mg | ORAL_TABLET | Freq: Three times a day (TID) | ORAL | Status: DC | PRN
  Administered 2020-09-14: 1 mg via ORAL
  Filled 2020-09-14: qty 1

## 2020-09-14 MED FILL — Thrombin (Recombinant) For Soln 20000 Unit: CUTANEOUS | Qty: 1 | Status: AC

## 2020-09-14 MED FILL — Sodium Bicarbonate IV Soln 8.4%: INTRAVENOUS | Qty: 50 | Status: AC

## 2020-09-14 MED FILL — Heparin Sodium (Porcine) Inj 1000 Unit/ML: INTRAMUSCULAR | Qty: 10 | Status: AC

## 2020-09-14 MED FILL — Mannitol IV Soln 20%: INTRAVENOUS | Qty: 500 | Status: AC

## 2020-09-14 MED FILL — Sodium Chloride IV Soln 0.9%: INTRAVENOUS | Qty: 3000 | Status: AC

## 2020-09-14 MED FILL — Lidocaine HCl Local Preservative Free (PF) Inj 2%: INTRAMUSCULAR | Qty: 5 | Status: AC

## 2020-09-14 MED FILL — Electrolyte-R (PH 7.4) Solution: INTRAVENOUS | Qty: 4000 | Status: AC

## 2020-09-14 NOTE — Hospital Course (Addendum)
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 In the ED, she was found to be in severe respiratory distress, hypertensive with SBP in the 180-190's, and tachypneic. She was placed on supplemental oxygen. Chest x ray done showed stable cardiomegaly, persistent but improving pulmonary edema, persistent small left pleural effusion, and atelectasis. CT of abdomen and pelvis showed bilateral pleural effusions with atelectasis, cardiomegaly, coronary artery disease, gallbladder wall thickening and/or pericholecystic fluid, calcified atherosclerosis in the abdominal aorta, and erosion of the superior endplate of L3 and the inferior endplate of L2 with air in the disc space (discitis/osteomyelitis cannot be excluded). Initial Troponin I (high sensitivity) was 40 and max is 1,499. She ruled in for a NSTEMI. She was started on IV Heparin. Of note, CBC showed H and H to be 9.2 and 28.1.Patient underwent a cardiac catheterization on 09/10/2020 that showed 80% stenosis of the proximal and mid LAD as well as up to 90% stenosis of the mid RCA. Echo done 09/09/2020 showed LVEF 60%, small pericardial effusion, mild AS, mild MR, and severe mitral annular calcification. Of note, echo done May 2022 showed mild to moderate MR and MS, and mild AS. Dr. Cyndia Bent has been consulted for consideration of coronary artery bypass grafting surgery.  The patient and all relevant studies were evaluated by Dr. Cyndia Bent recommended proceeding with CABG as best surgical revascularization option at this time.   Hospital course:  The patient was medically stabilized on the hospitalist service with the assistance of cardiology and nephrology for hemodialysis management.  She was felt  to be stable to proceed with surgery and on 09/13/2020 taken the operating room at which time she underwent CABG x2.  She tolerated procedure well and was taken to the surgical intensive care unit in stable condition.  Postoperative hospital course:  On postoperative day 1 the patient was hemodynamically stable in sinus rhythm.  She was on Cardene for hypertension and blood pressure was monitored closly.  Dialysis management was again reinitiated by the nephrology service.  She was extubated without difficulty using standard protocols.  Her Swan-Ganz and arterial line were removed on postoperative 1.  Chest tubes were removed as well.  She has an expected acute blood loss anemia which is stabilized.  On postoperative day 2 the patient had a event concerning for seizure activity and possible CVA.  A code stroke was called and CT scan was negative for acute finding.  She was noted to have some mental status changes.  The seizure activity was described as like spasms in her legs as she also had right arm flexion and head turn to the right and did become unresponsive.  The activity lasted approximately 1 minute and following this she remained confused and there was a question of left-sided weakness.  A stat EEG showed no definite seizure activity and it was subsequently followed by video prolonged EEG.  She was started on Keppra and as well as loaded with phenytoin.  She has had no repeat events and antiseizure medications have subsequently been discontinued.  Her tramadol was also discontinued.  The video EEG showed no evidence of seizure activity and findings were consistent with moderate diffuse encephalopathy of nonspecific etiology.  She continues to be managed from a nephrology  viewpoint with hemodialysis managed by the renal service.  During dialysis on 09/20/2018.  She has difficulty with elevated heart rate as well as decreased blood pressure requiring stopping the procedure after 2 hours.  She stabilized  quickly and returned to the ICU.  She has a history of CKD and HD was arranged by Nephrology. She developed edema in the LUE and breast. Scan was negative for DVT and it was felt that swelling was due to LUE AV fistula with venous hypertension. Overnight she developed some dark-colored stool as well as streaks of fresh blood as well as nausea and vomiting.  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  Her hemoglobin hematocrit dropped to 8.3 and 23.8 respectively.  Gastroenterology consultation was obtained and she has subsequently been started on a Protonix drip as well as Anusol suppositories.  If she continues to have bleeding she may require a bleeding scan or CTA for further evaluation as she is not felt to be a strong enough candidate to undergo colonoscopy at this time.  She was transfused with 2 units of packed red cells on 09/20/2020.  This resulted in only a 0.5% increase in her hematocrit.  She continued to have some dark stools with red streaks over the course of the day.  Dr. Alessandra Bevels recommended nuclear medicine bleeding scan which was performed and proved to be negative.  He further recommended proceeding with endoscopy and possibly sigmoidoscopy on 09/21/2020 but the family initially declined these procedures.  The Protonix drip was continued.  She was permitted to have a full liquid diet. Because of further bloody stools, patient and family agreeable to upper GI endoscopy on 09/22/2020. 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 She had post op a fib, but was maintaining SR on Amiodarone. She was felt surgically stable for transfer from the ICU to 4E on 08/16. Blood pressure was well controlled as of 08/18 on Clonidine 0.1 mg bid, Hydralazine 100 mg tid, Irbesartan 300 mg daily, Imdur 30 mg daily, and Labetalol 200 mg tid. She had  thrombocytopenia post op. Platelets went down to 72,000 but then gradually increased. She also had anemic of chronic disease.  She had nausea and emesis on 08/19. Zofran was continue and Reglan was given. She did have a repeat GI bleed with bright red blood and GI was re-consulted on 8/21.  She underwent both EGD and colonoscopy on 10/03/2018 for which were negative for active bleeding.  Colonoscopy did show inflamed and eroded internal hemorrhoids which could be the source of bleeding.  She is also noted to have diffuse colonic diverticuli but no signs of bleeding.  She has been quite stable from a CV surgical perspective remaining in sinus rhythm with some systolic hypertension at times, but mostly blood pressure is well controlled.  She has some moderate malnutrition in the context of acute on chronic disease and is receiving supplements.  She is overall progressing well in terms of her physical rehabilitation, albeit slow due to deconditioning and multiple comorbidities.  She has had no further GI bleeding in over 48 hours and hemoglobin hematocrit are felt to be stable, although decreased at 7.0 and 21.9.  She is on Aranesp through the nephrology service and iron studies have been obtained.  She will require continued ongoing outpatient management of her anemia but is felt to be stable and not require  further transfusion at this time.  Overall from a medical perspective she is felt to be stable for discharge on today's date 10/04/20.

## 2020-09-14 NOTE — Progress Notes (Signed)
1 Day Post-Op Procedure(s) (LRB): CORONARY ARTERY BYPASS GRAFTING (CABG), ON PUMP, TIMES TWO, USING LEFT INTERNAL MAMMARY ARTERY AND RIGHT ENDOSCOPICALLY HARVESTED GREATER SAPHENOUS VEIN (N/A) TRANSESOPHAGEAL ECHOCARDIOGRAM (TEE) (N/A) Subjective: Complains of pain  Objective: Vital signs in last 24 hours: Temp:  [95.18 F (35.1 C)-99.14 F (37.3 C)] 97.88 F (36.6 C) (08/05 0700) Pulse Rate:  [64-92] 73 (08/05 0700) Cardiac Rhythm: Normal sinus rhythm (08/04 2000) Resp:  [10-20] 13 (08/05 0700) BP: (119)/(51) 119/51 (08/04 1430) SpO2:  [97 %-100 %] 100 % (08/05 0700) Arterial Line BP: (99-159)/(43-66) 140/49 (08/05 0700) FiO2 (%):  [40 %-50 %] 40 % (08/04 2100) Weight:  [65.7 kg] 65.7 kg (08/05 0500)  Hemodynamic parameters for last 24 hours: PAP: (19-33)/(6-15) 33/15 CO:  [3.5 L/min-5.4 L/min] 5.4 L/min CI:  [2.2 L/min/m2-3.3 L/min/m2] 3.3 L/min/m2  Intake/Output from previous day: 08/04 0701 - 08/05 0700 In: 5838.9 [I.V.:3916; Blood:815; IV Piggyback:1108] Out: [id number] [Urine:23; Emesis/NG output:50; Blood:1000; Chest Tube:710] Intake/Output this shift: No intake/output data recorded.  General appearance: alert and cooperative Neurologic: intact Heart: regular rate and rhythm Lungs: clear to auscultation bilaterally Extremities: edema mild Wound: dressing dry  Lab Results: Recent Labs    09/13/20 1940 09/13/20 2009 09/13/20 2152 09/14/20 0500  WBC 6.9  --   --  9.0  HGB 11.0*   < > 11.9* 11.2*  HCT 32.7*   < > 35.0* 34.6*  PLT 119*  --   --  128*   < > = values in this interval not displayed.   BMET:  Recent Labs    09/13/20 1940 09/13/20 2009 09/13/20 2152 09/14/20 0500  NA 134*   < > 134* 132*  K 3.7   < > 3.8 3.7  CL 100  --   --  98  CO2 25  --   --  25  GLUCOSE 84  --   --  102*  BUN 13  --   --  15  CREATININE 3.42*  --   --  3.92*  CALCIUM 8.8*  --   --  9.1   < > = values in this interval not displayed.    PT/INR:  Recent Labs     09/13/20 2000  LABPROT 16.3*  INR 1.3*   ABG    Component Value Date/Time   PHART 7.311 (L) 09/13/2020 2152   HCO3 25.5 09/13/2020 2152   TCO2 27 09/13/2020 2152   ACIDBASEDEF 1.0 09/13/2020 2152   O2SAT 99.0 09/13/2020 2152   CBG (last 3)  Recent Labs    09/14/20 0557 09/14/20 0618 09/14/20 0656  GLUCAP 66* 208* 150*   CXR: bibasilar atelectasis  ECG: sinus, no acute changes.  Assessment/Plan: S/P Procedure(s) (LRB): CORONARY ARTERY BYPASS GRAFTING (CABG), ON PUMP, TIMES TWO, USING LEFT INTERNAL MAMMARY ARTERY AND RIGHT ENDOSCOPICALLY HARVESTED GREATER SAPHENOUS VEIN (N/A) TRANSESOPHAGEAL ECHOCARDIOGRAM (TEE) (N/A)  POD 1 Hemodynamically stable in sinus rhythm. On Cardene for HTN. Continue lopressor. Will wait until after HD to increase antihypertensives.  DC chest tubes after dangle.  DC swan and arterial line.  HD per nephrology.  Glucose under good control. Transition to SSI.  OOB, IS, ambulate as tolerated    LOS: 6 days    Gaye Pollack 09/14/2020

## 2020-09-14 NOTE — Anesthesia Postprocedure Evaluation (Signed)
Anesthesia Post Note  Patient: Jodi Bell  Procedure(s) Performed: CORONARY ARTERY BYPASS GRAFTING (CABG), ON PUMP, TIMES TWO, USING LEFT INTERNAL MAMMARY ARTERY AND RIGHT ENDOSCOPICALLY HARVESTED GREATER SAPHENOUS VEIN (Chest) TRANSESOPHAGEAL ECHOCARDIOGRAM (TEE)     Patient location during evaluation: SICU Anesthesia Type: General Level of consciousness: sedated and patient remains intubated per anesthesia plan Pain management: pain level controlled Vital Signs Assessment: post-procedure vital signs reviewed and stable Respiratory status: patient remains intubated per anesthesia plan and patient on ventilator - see flowsheet for VS Cardiovascular status: stable Anesthetic complications: no   No notable events documented.  Last Vitals:  Vitals:   09/14/20 0600 09/14/20 0700  BP:    Pulse: 72 73  Resp: 20 13  Temp: 36.7 C 36.6 C  SpO2: 99% 100%    Last Pain:  Vitals:   09/14/20 0400  TempSrc: Core  PainSc: Patrick

## 2020-09-14 NOTE — Progress Notes (Addendum)
Physical Therapy Treatment/Re-evaluation Patient Details Name: Jodi Bell MRN: [id number] DOB: 1960-06-19 Today's Date: 09/14/2020    History of Present Illness Pt is a 60 y.o. female admitted 09/08/20 with acute onset SOB. Workup for CHF decompensaiton, COPD exacerbation, HTN urgency, NSTEMI; need for emergent HD. S/p LHC 8/1 which showed severe two-vessel CAD. CABGx2 8/4. PMH includes ESRD (HD MWF), HTN, DM2, COPD, OSA, psoriatic RA, PAD, anemia, tobacco abuse.    PT Comments    Pt demonstrated increased level of assist with bed mobility, transfers, and gait, limited by pain and fatigue 2/2 post-op CABG. VSS throughout. Pt educated on use of DME, transfers, and sternal precautions. Pt requires reinforcement with sternal precautions. Goals remain appropriate and recommend HHPT depending on pt's progress.   Pre-vitals - HR 66, spO2 100% on 2L, BP 117/64(83) Vitals during treatment - HR 66-70, spO2 93-96% on RA     Follow Up Recommendations  Home health PT (Dependent on progress)     Equipment Recommendations       Recommendations for Other Services       Precautions / Restrictions Precautions Precautions: Fall;Sternal Precaution Booklet Issued: Yes (comment) Precaution Comments: Reviewed sternal precautions and provided handout. Pt demonstrated understanding, but requires reinforcement Restrictions Weight Bearing Restrictions: No    Mobility  Bed Mobility Overal bed mobility: Needs Assistance Bed Mobility: Supine to Sit     Supine to sit: HOB elevated;Mod assist     General bed mobility comments: min A to initiate LEs to EOB. Mod A to elevate trunk, adjust hips toward EOB, and adhere to sternal precautions. Required multimodal cues to initiate task.    Transfers Overall transfer level: Needs assistance Equipment used: Rolling walker (2 wheeled)   Sit to Stand: Min assist;+2 safety/equipment         General transfer comment: sist to stand transfer x2. 1st  trial from EOB and 2nd from recliner. Mutlimodal cues provided for hand placement on RW and adherence to sternal precautions for both trials. Min A to power up into standing. +2 for safety and management of lines  Ambulation/Gait Ambulation/Gait assistance: Min assist;+2 physical assistance Gait Distance (Feet): 10 Feet Assistive device: Rolling walker (2 wheeled) Gait Pattern/deviations: Decreased stride length;Step-through pattern;Trunk flexed Gait velocity: Decreased   General Gait Details: Pt ambulated 31f before taking a seated rest and being wheeled back into room. Verbal cues provided for upright standing position. Min A to guide RW and keep hips toward RW. +2 for chair follow   Stairs             Wheelchair Mobility    Modified Rankin (Stroke Patients Only)       Balance Overall balance assessment: Needs assistance Sitting-balance support: No upper extremity supported;Feet supported Sitting balance-Leahy Scale: Fair     Standing balance support: Bilateral upper extremity supported Standing balance-Leahy Scale: Poor Standing balance comment: Reliant on BUE support with RW and demonstrated increased trunk flexion                            Cognition Arousal/Alertness: Awake/alert Behavior During Therapy: Flat affect Overall Cognitive Status: No family/caregiver present to determine baseline cognitive functioning                                 General Comments: Pt appeared to have brain fog during session. Disoriented to time and required verbal cueing about precautions.  Followed one-step commands accurately.      Exercises      General Comments General comments (skin integrity, edema, etc.): VSS on RA      Pertinent Vitals/Pain Pain Assessment: Faces Faces Pain Scale: Hurts whole lot Pain Location: All over, but mostly mentioned soreness in shoulders Pain Descriptors / Indicators:  Discomfort;Sore;Constant;Moaning;Grimacing;Guarding Pain Intervention(s): Limited activity within patient's tolerance;Repositioned;Monitored during session;Other (comment) (Massage)    Home Living                      Prior Function            PT Goals (current goals can now be found in the care plan section) Acute Rehab PT Goals Patient Stated Goal: return home with assist from family PT Goal Formulation: With patient Time For Goal Achievement: 09/25/20 Potential to Achieve Goals: Fair Progress towards PT goals: Not progressing toward goals - comment (Did not progress toward goals due to pain and fatigue post-op. Will reassess next session)    Frequency    Min 3X/week      PT Plan Current plan remains appropriate    Co-evaluation              AM-PAC PT "6 Clicks" Mobility   Outcome Measure  Help needed turning from your back to your side while in a flat bed without using bedrails?: A Little Help needed moving from lying on your back to sitting on the side of a flat bed without using bedrails?: A Little Help needed moving to and from a bed to a chair (including a wheelchair)?: A Lot Help needed standing up from a chair using your arms (e.g., wheelchair or bedside chair)?: A Lot Help needed to walk in hospital room?: A Lot Help needed climbing 3-5 steps with a railing? : A Lot 6 Click Score: 14    End of Session Equipment Utilized During Treatment: Gait belt Activity Tolerance: Patient limited by fatigue;Patient limited by pain Patient left: in chair;with call bell/phone within reach;with chair alarm set Nurse Communication: Mobility status PT Visit Diagnosis: Other abnormalities of gait and mobility (R26.89);Muscle weakness (generalized) (M62.81)     Time: [id number] PT Time Calculation (min) (ACUTE ONLY): 33 min  Charges:  $Therapeutic Activity: 8-22 mins                     Louie Casa, SPT Acute Rehab: (336) [id number]    Domingo Dimes 09/14/2020,  1:08 PM

## 2020-09-14 NOTE — Progress Notes (Signed)
Patient ID: Jodi Bell, female   DOB: February 06, 1961, 60 y.o.   MRN: [id number]  [facility] Progress Note   Assessment/ Plan:   1.  Non-ST elevation MI status post two-vessel CABG yesterday: Presented with volume overload/acute hypoxic respiratory failure. 2. ESRD: On hemodialysis on a Monday/Wednesday/Friday schedule as an outpatient and she is currently hemodynamically stable.  Will order for hemodialysis later today-no acute lab abnormality/significant volume excess. 3. Anemia: Slight drop of hemoglobin/hematocrit noted this morning on labs, continue ESA 4. CKD-MBD: Calcium and phosphorus level acceptable range, continue to follow trend with hemodialysis/binders when diet is resumed. 5. Nutrition: Resume renal diet when permitted by CT surgery 6. Hypertension: Currently on nicardipine drip, monitor with hemodialysis  Subjective:   Feels like she has some irritation in her throat/upper part of chest   Objective:   BP (!) 119/51   Pulse 73   Temp 97.88 F (36.6 C)   Resp 13   Ht '5\' 2"'$  (1.575 m)   Wt 65.7 kg   SpO2 100%   BMI 26.49 kg/m   Physical Exam: Gen: Appears somewhat uncomfortable resting in bed, propped up CVS: Pulse regular rhythm, distant heart sounds with intact sternotomy dressing Resp: Decreased breath sounds bilaterally due to poor inspiratory excursion Abd: Soft, obese, nontender, bowel sounds normal Ext: Without lower extremity edema, left upper arm AV fistula pulsatile  Labs: BMET Recent Labs  Lab 09/10/20 0251 09/11/20 0358 09/12/20 0228 09/12/20 1946 09/13/20 0333 09/13/20 0807 09/13/20 0811 09/13/20 0921 09/13/20 0948 09/13/20 1014 09/13/20 1025 09/13/20 1044 09/13/20 1124 09/13/20 1251 09/13/20 1940 09/13/20 2009 09/13/20 2105 09/13/20 2152 09/14/20 0500  NA 133* 132* 129* 132* 130* 132*   < > 132* 130*   < > 133*   < > 133* 135 134* 135 133* 134* 132*  K 3.5 4.1 4.3 3.4* 3.3* 3.4*   < > 3.2* 3.2*   < > 4.3   < > 4.1 4.0  3.7 3.7 3.7 3.8 3.7  CL 96* 93* 92* 97* 92* 95*  --  95* 96*  --  97*  --  96*  --  100  --   --   --  98  CO2 '27 28 26 26 29  '$ --   --   --   --   --   --   --   --   --  25  --   --   --  25  GLUCOSE 198* 120* 122* 153* 111* 88  --  101* 103*  --  112*  --  143*  --  84  --   --   --  102*  BUN 8 19 22* 7 <5* 7  --  9 9  --  8  --  9  --  13  --   --   --  15  CREATININE 2.71* 4.36* 5.59* 2.57* 3.13* 3.30*  --  3.30* 3.30*  --  3.10*  --  3.10*  --  3.42*  --   --   --  3.92*  CALCIUM 9.9 10.5* 10.4* 9.3 9.8  --   --   --   --   --   --   --   --   --  8.8*  --   --   --  9.1  PHOS  --   --   --  1.5*  --   --   --   --   --   --   --   --   --   --  3.2  --   --   --   --    < > = values in this interval not displayed.   CBC Recent Labs  Lab 09/08/20 1221 09/09/20 0300 09/13/20 0333 09/13/20 0807 09/13/20 1048 09/13/20 1121 09/13/20 1247 09/13/20 1251 09/13/20 1940 09/13/20 2009 09/13/20 2105 09/13/20 2152 09/14/20 0500  WBC 7.1   < > 5.4  --   --   --  7.0  --  6.9  --   --   --  9.0  NEUTROABS 4.7  --   --   --   --   --   --   --   --   --   --   --   --   HGB 9.2*   < > 7.9*   < > 6.8*   < > 8.1*   < > 11.0* 10.9* 11.2* 11.9* 11.2*  HCT 28.1*   < > 23.7*   < > 20.0*   < > 24.5*   < > 32.7* 32.0* 33.0* 35.0* 34.6*  MCV 101.1*   < > 97.1  --   --   --  90.7  --  88.6  --   --   --  90.3  PLT 243   < > 167  --  119*  --  87*  --  119*  --   --   --  128*   < > = values in this interval not displayed.      Medications:     sodium chloride   Intravenous Once   acetaminophen  1,000 mg Oral Q6H   Or   acetaminophen (TYLENOL) oral liquid 160 mg/5 mL  1,000 mg Per Tube Q6H   acetaminophen (TYLENOL) oral liquid 160 mg/5 mL  650 mg Per Tube Once   Or   acetaminophen  650 mg Rectal Once   allopurinol  100 mg Oral QPM   aspirin EC  325 mg Oral Daily   Or   aspirin  324 mg Per Tube Daily   bisacodyl  10 mg Oral Daily   Or   bisacodyl  10 mg Rectal Daily   calcium acetate   2,001 mg Oral TID WC   chlorhexidine  15 mL Mouth Rinse BID   Chlorhexidine Gluconate Cloth  6 each Topical Q0600   cholecalciferol  1,000 Units Oral QPM   cinacalcet  30 mg Oral Q T,Th,Sa-HD   darbepoetin (ARANESP) injection - DIALYSIS  100 mcg Intravenous Q Fri-HD   docusate sodium  200 mg Oral Daily   ferrous sulfate  325 mg Oral Once per day on Mon Wed Fri   fluticasone  1 spray Each Nare Daily   folic acid  1 mg Oral Daily   insulin aspart  0-24 Units Subcutaneous Q4H   mouth rinse  15 mL Mouth Rinse q12n4p   metoprolol tartrate  12.5 mg Oral BID   Or   metoprolol tartrate  12.5 mg Per Tube BID   montelukast  10 mg Oral Q supper   nystatin  5 mL Oral QID   [START ON 09/15/2020] pantoprazole  40 mg Oral Daily   polyethylene glycol  17 g Oral Daily   polyvinyl alcohol  1 drop Both Eyes TID AC & HS   predniSONE  5 mg Oral Q breakfast   saccharomyces boulardii  250 mg Oral Daily   sodium chloride flush  3 mL Intravenous Q12H   umeclidinium bromide  1 puff Inhalation  Daily   Elmarie Shiley, MD 09/14/2020, 8:02 AM

## 2020-09-14 NOTE — Progress Notes (Signed)
EVENING ROUNDS NOTE :     [address]       ,[place] [address]             [phone]                 1 Day Post-Op Procedure(s) (LRB): CORONARY ARTERY BYPASS GRAFTING (CABG), ON PUMP, TIMES TWO, USING LEFT INTERNAL MAMMARY ARTERY AND RIGHT ENDOSCOPICALLY HARVESTED GREATER SAPHENOUS VEIN (N/A) TRANSESOPHAGEAL ECHOCARDIOGRAM (TEE) (N/A)   Total Length of Stay:  LOS: 6 days  Events:   No events Resting comfortably    BP 109/67 (BP Location: Right Arm)   Pulse 66   Temp 97.9 F (36.6 C) (Oral)   Resp 19   Ht '5\' 2"'$  (1.575 m)   Wt 65.7 kg   SpO2 97%   BMI 26.49 kg/m   PAP: (21-33)/(6-15) 33/14 CO:  [4.2 L/min-5.4 L/min] 5.4 L/min CI:  [2.6 L/min/m2-3.3 L/min/m2] 3.3 L/min/m2  Vent Mode: CPAP;PSV FiO2 (%):  [40 %-50 %] 40 % Set Rate:  [4 bmp-12 bmp] 4 bmp Vt Set:  [400 mL] 400 mL PEEP:  [5 cmH20] 5 cmH20 Pressure Support:  [10 cmH20] 10 cmH20   sodium chloride Stopped (09/14/20 0652)   sodium chloride 250 mL (09/14/20 0509)   sodium chloride 10 mL/hr at 09/13/20 1245    ceFAZolin (ANCEF) IV 2 g (09/14/20 1729)   lactated ringers     lactated ringers     magnesium sulfate     niCARDipine 5 mg/hr (09/14/20 1427)   nitroGLYCERIN 0 mcg/min (09/13/20 1257)   phenylephrine (NEO-SYNEPHRINE) Adult infusion Stopped (09/13/20 1623)    I/O last 3 completed shifts: In: 6198.9 [P.O.:360; I.V.:3916; Blood:815; IV [id number] Out: [id number] [Urine:23; Emesis/NG output:50; Blood:1000; Chest Tube:710]   CBC Latest Ref Rng & Units 09/14/2020 09/14/2020 09/13/2020  WBC 4.0 - 10.5 K/uL 9.8 9.0 -  Hemoglobin 12.0 - 15.0 g/dL 11.2(L) 11.2(L) 11.9(L)  Hematocrit 36.0 - 46.0 % 34.1(L) 34.6(L) 35.0(L)  Platelets 150 - 400 K/uL 114(L) 128(L) -    BMP Latest Ref Rng & Units 09/14/2020 09/14/2020 09/13/2020  Glucose 70 - 99 mg/dL 76 102(H) -  BUN 6 - 20 mg/dL 20 15 -  Creatinine 0.44 - 1.00 mg/dL 4.72(H) 3.92(H) -  Sodium 135 - 145 mmol/L 132(L) 132(L) 134(L)  Potassium  3.5 - 5.1 mmol/L 4.8 3.7 3.8  Chloride 98 - 111 mmol/L 98 98 -  CO2 22 - 32 mmol/L 24 25 -  Calcium 8.9 - 10.3 mg/dL 9.8 9.1 -    ABG    Component Value Date/Time   PHART 7.311 (L) 09/13/2020 2152   PCO2ART 50.6 (H) 09/13/2020 2152   PO2ART 170 (H) 09/13/2020 2152   HCO3 25.5 09/13/2020 2152   TCO2 27 09/13/2020 2152   ACIDBASEDEF 1.0 09/13/2020 2152   O2SAT 99.0 09/13/2020 2152       Jodi Bouillon, MD 09/14/2020 6:18 PM

## 2020-09-15 ENCOUNTER — Inpatient Hospital Stay (HOSPITAL_COMMUNITY): Payer: Medicare PPO

## 2020-09-15 DIAGNOSIS — R569 Unspecified convulsions: Secondary | ICD-10-CM | POA: Diagnosis not present

## 2020-09-15 DIAGNOSIS — Z951 Presence of aortocoronary bypass graft: Secondary | ICD-10-CM | POA: Diagnosis not present

## 2020-09-15 DIAGNOSIS — J810 Acute pulmonary edema: Secondary | ICD-10-CM | POA: Diagnosis not present

## 2020-09-15 LAB — BASIC METABOLIC PANEL
Anion gap: 10 (ref 5–15)
BUN: 25 mg/dL — ABNORMAL HIGH (ref 6–20)
CO2: 24 mmol/L (ref 22–32)
Calcium: 9.7 mg/dL (ref 8.9–10.3)
Chloride: 96 mmol/L — ABNORMAL LOW (ref 98–111)
Creatinine, Ser: 5.26 mg/dL — ABNORMAL HIGH (ref 0.44–1.00)
GFR, Estimated: 9 mL/min — ABNORMAL LOW (ref >60)
Glucose, Bld: 88 mg/dL (ref 70–99)
Potassium: 5 mmol/L (ref 3.5–5.1)
Sodium: 130 mmol/L — ABNORMAL LOW (ref 135–145)

## 2020-09-15 LAB — CBC
HCT: 32.4 % — ABNORMAL LOW (ref 36.0–46.0)
Hemoglobin: 10.6 g/dL — ABNORMAL LOW (ref 12.0–15.0)
MCH: 29.6 pg (ref 26.0–34.0)
MCHC: 32.7 g/dL (ref 30.0–36.0)
MCV: 90.5 fL (ref 80.0–100.0)
Platelets: 107 10*3/uL — ABNORMAL LOW (ref 150–400)
RBC: 3.58 MIL/uL — ABNORMAL LOW (ref 3.87–5.11)
RDW: 24.1 % — ABNORMAL HIGH (ref 11.5–15.5)
WBC: 10 10*3/uL (ref 4.0–10.5)
nRBC: 0 % (ref 0.0–0.2)

## 2020-09-15 LAB — GLUCOSE, CAPILLARY
Glucose-Capillary: 104 mg/dL — ABNORMAL HIGH (ref 70–99)
Glucose-Capillary: 122 mg/dL — ABNORMAL HIGH (ref 70–99)
Glucose-Capillary: 123 mg/dL — ABNORMAL HIGH (ref 70–99)
Glucose-Capillary: 136 mg/dL — ABNORMAL HIGH (ref 70–99)
Glucose-Capillary: 34 mg/dL — CL (ref 70–99)
Glucose-Capillary: 58 mg/dL — ABNORMAL LOW (ref 70–99)
Glucose-Capillary: 87 mg/dL (ref 70–99)
Glucose-Capillary: 93 mg/dL (ref 70–99)

## 2020-09-15 MED ORDER — AMIODARONE LOAD VIA INFUSION
150.0000 mg | Freq: Once | INTRAVENOUS | Status: AC
  Administered 2020-09-15: 150 mg via INTRAVENOUS
  Filled 2020-09-15: qty 83.34

## 2020-09-15 MED ORDER — PHENYTOIN SODIUM 50 MG/ML IJ SOLN
100.0000 mg | Freq: Three times a day (TID) | INTRAMUSCULAR | Status: DC
  Administered 2020-09-15 (×2): 100 mg via INTRAVENOUS
  Filled 2020-09-15 (×3): qty 2

## 2020-09-15 MED ORDER — SODIUM CHLORIDE 0.9 % IV SOLN
20.0000 mg/kg | INTRAVENOUS | Status: AC
  Administered 2020-09-15: 1314 mg via INTRAVENOUS
  Filled 2020-09-15: qty 26.28

## 2020-09-15 MED ORDER — LORAZEPAM 2 MG/ML IJ SOLN
INTRAMUSCULAR | Status: AC
  Administered 2020-09-15: 2 mg
  Filled 2020-09-15: qty 1

## 2020-09-15 MED ORDER — NICARDIPINE HCL IN NACL 40-0.83 MG/200ML-% IV SOLN
3.0000 mg/h | INTRAVENOUS | Status: DC
  Administered 2020-09-15: 3 mg/h via INTRAVENOUS
  Filled 2020-09-15 (×2): qty 200

## 2020-09-15 MED ORDER — LEVETIRACETAM IN NACL 1000 MG/100ML IV SOLN
1000.0000 mg | Freq: Two times a day (BID) | INTRAVENOUS | Status: DC
  Administered 2020-09-15 – 2020-09-16 (×3): 1000 mg via INTRAVENOUS
  Filled 2020-09-15 (×3): qty 100

## 2020-09-15 MED ORDER — LEVETIRACETAM IN NACL 1500 MG/100ML IV SOLN
1500.0000 mg | INTRAVENOUS | Status: AC
  Administered 2020-09-15: 1500 mg via INTRAVENOUS
  Filled 2020-09-15: qty 100

## 2020-09-15 MED ORDER — CHLORHEXIDINE GLUCONATE CLOTH 2 % EX PADS
6.0000 | MEDICATED_PAD | Freq: Every day | CUTANEOUS | Status: DC
  Administered 2020-09-15 – 2020-09-18 (×4): 6 via TOPICAL

## 2020-09-15 MED ORDER — IOHEXOL 350 MG/ML SOLN
100.0000 mL | Freq: Once | INTRAVENOUS | Status: AC | PRN
  Administered 2020-09-15: 100 mL via INTRAVENOUS

## 2020-09-15 MED ORDER — AMIODARONE HCL IN DEXTROSE 360-4.14 MG/200ML-% IV SOLN
60.0000 mg/h | INTRAVENOUS | Status: AC
  Administered 2020-09-15 (×2): 60 mg/h via INTRAVENOUS
  Filled 2020-09-15: qty 200

## 2020-09-15 MED ORDER — AMIODARONE HCL IN DEXTROSE 360-4.14 MG/200ML-% IV SOLN
30.0000 mg/h | INTRAVENOUS | Status: DC
Start: 2020-09-15 — End: 2020-09-18
  Administered 2020-09-16 – 2020-09-17 (×4): 30 mg/h via INTRAVENOUS
  Filled 2020-09-15 (×6): qty 200

## 2020-09-15 MED ORDER — WHITE PETROLATUM EX OINT
TOPICAL_OINTMENT | CUTANEOUS | Status: AC
  Administered 2020-09-15: 1
  Filled 2020-09-15: qty 28.35

## 2020-09-15 NOTE — Progress Notes (Signed)
Rapid Response Event Note   Reason for Call :  L sided weakness and difficulty with speech, twitching when trying to get up  Initial Focused Assessment:   Pt only oriented to self difficulty speaking, leaned to R and gaze to R. Diaphoretic unable to follow commands on L side, unable to move L arm or leg and difficulty looking to L side. LKW 0500   Interventions:  CBG 87 Code Stroke called 0530 CT head CTA Neurology to bedside  '2mg'$  Ativan given Code Stroke cancelled    Plan of Care:  L side improvement able to follow commands in upper extremity, still only oriented to self, intermittent twitching present, Kepra started. Please call Rapid Response if further assistance needed.    Devota Pace, RN

## 2020-09-15 NOTE — Consult Note (Signed)
NAME:  Jodi Bell, MRN:  [id number], DOB:  03-02-1960, LOS: 7 ADMISSION DATE:  09/08/2020, CONSULTATION DATE: 09/15/2020 REFERRING MD: Dr. Kipp Brood, CHIEF COMPLAINT: Possible seizure  History of Present Illness:  This is a 60 year old female, past medical history of ESRD on dialysis, CAD, COPD, type 2 diabetes.  Patient is here in the hospital status post CABG.  Last night she was doing well until around 5 AM.  She started having spasming in her legs right arm flexion head turn to the right became unresponsive.  Felt to have seizure-like activity lasting approximately 1 minute.  Post seizure had left-sided weakness and a code stroke was called.  Patient was seen by neurology and sent down for a CT scan of the head which was negative.  Also had a spot EEG.  Now plan for long-term video EEG.  Critical care was consulted for recommendations and management.  She did receive Ativan and was more somnolent after this event.  Pertinent  Medical History   Past Medical History:  Diagnosis Date   Anemia of chronic disease    Anxiety    Arthritis    oa and psoriatic ra   Asthma    Back pain, chronic    lower back   Candida infection finished tx 2 days ago   Chronic insomnia    COPD (chronic obstructive pulmonary disease) (HCC)    Coronary artery calcification seen on CT scan    Diabetes mellitus    type 2   Eczema    ESRD on dialysis [facility])    Essential hypertension    GERD (gastroesophageal reflux disease)    Gout    History of hiatal hernia    Hypoalbuminemia    Hyponatremia    Mild aortic stenosis    Mitral regurgitation    Mitral stenosis    Obesity    PAD (peripheral artery disease) ([place])    a. externial iliac calcification seen on CT 04/2020.   Pneumonia few yrs ago x 2   Sleep apnea    Tobacco abuse    Upper GI bleed 05/2019     Significant Hospital Events: Including procedures, antibiotic start and stop dates in addition to other pertinent events   8/6 Early morning,  seizure-like activity.  Interim History / Subjective:  Somnolent responsive to voice.  On EEG  Objective   Blood pressure 133/68, pulse 64, temperature (!) 97.5 F (36.4 C), temperature source Axillary, resp. rate (!) 23, height '5\' 2"'$  (1.575 m), weight 67.5 kg, SpO2 100 %.        Intake/Output Summary (Last 24 hours) at 09/15/2020 0944 Last data filed at 09/15/2020 0600 Gross per 24 hour  Intake 1256.85 ml  Output 15 ml  Net 1241.85 ml   Filed Weights   09/14/20 0500 09/15/20 0500 09/15/20 0901  Weight: 65.7 kg 67.4 kg 67.5 kg    Examination: General: Chronically ill-appearing, elderly female, resting in bed HENT: NCAT Lungs: Clear to auscultation bilaterally Cardiovascular: Regular rate rhythm S1-S2 Abdomen: Soft nontender nondistended Extremities: No significant edema Neuro: Will arouse to stimulation, otherwise does not follow commands GU: Deferred  Resolved Hospital Problem list     Assessment & Plan:   Acute metabolic encephalopathy Possible CVA, possible seizure Plan: Long-term video EEG Keppra Phenytoin Brain MRI once stable, once pacer wires removed Tramadol discontinued Appreciate recs and coordination by neurology  Acute hypoxemic respiratory failure on nasal cannula Plan Start end-tidal CO2 monitoring Continue to titrate O2 maintain SPO2 greater than 90%  Monitor closely in the ICU, high risk need for intubation I suspect its from hypoventilation and postictal state  NSTEMI Coronary artery disease status post CABG Plan: Postop care per thoracic surgery  End-stage renal disease on IHD Plan: Nephrology consulted Plan for IHD today.  Best Practice (right click and "Reselect all SmartList Selections" daily)   Diet/type: NPO DVT prophylaxis: SCD GI prophylaxis: PPI Lines: yes and it is still needed Foley:  removal ordered  Code Status:  full code Last date of multidisciplinary goals of care discussion [per primary]  Labs   CBC: Recent Labs   Lab 09/08/20 1221 09/09/20 0300 09/13/20 1247 09/13/20 1251 09/13/20 1940 09/13/20 2009 09/13/20 2105 09/13/20 2152 09/14/20 0500 09/14/20 1710 09/15/20 0440  WBC 7.1   < > 7.0  --  6.9  --   --   --  9.0 9.8 10.0  NEUTROABS 4.7  --   --   --   --   --   --   --   --   --   --   HGB 9.2*   < > 8.1*   < > 11.0*   < > 11.2* 11.9* 11.2* 11.2* 10.6*  HCT 28.1*   < > 24.5*   < > 32.7*   < > 33.0* 35.0* 34.6* 34.1* 32.4*  MCV 101.1*   < > 90.7  --  88.6  --   --   --  90.3 89.5 90.5  PLT 243   < > 87*  --  119*  --   --   --  128* 114* 107*   < > = values in this interval not displayed.    Basic Metabolic Panel: Recent Labs  Lab 09/08/20 1221 09/09/20 0300 09/12/20 1946 09/13/20 0333 09/13/20 0807 09/13/20 1124 09/13/20 1251 09/13/20 1940 09/13/20 2009 09/13/20 2105 09/13/20 2152 09/14/20 0500 09/14/20 1710 09/15/20 0440  NA 134*   < > 132* 130*   < > 133*   < > 134*   < > 133* 134* 132* 132* 130*  K 4.4   < > 3.4* 3.3*   < > 4.1   < > 3.7   < > 3.7 3.8 3.7 4.8 5.0  CL 97*   < > 97* 92*   < > 96*  --  100  --   --   --  98 98 96*  CO2 29   < > 26 29  --   --   --  25  --   --   --  '25 24 24  '$ GLUCOSE 103*   < > 153* 111*   < > 143*  --  84  --   --   --  102* 76 88  BUN 18   < > 7 <5*   < > 9  --  13  --   --   --  15 20 25*  CREATININE 3.86*   < > 2.57* 3.13*   < > 3.10*  --  3.42*  --   --   --  3.92* 4.72* 5.26*  CALCIUM 10.3   < > 9.3 9.8  --   --   --  8.8*  --   --   --  9.1 9.8 9.7  MG 1.9  --   --   --   --   --   --   --   --   --   --  1.8 2.0  --  PHOS  --   --  1.5*  --   --   --   --  3.2  --   --   --   --   --   --    < > = values in this interval not displayed.   GFR: Estimated Creatinine Clearance: 10.3 mL/min (A) (by C-G formula based on SCr of 5.26 mg/dL (H)). Recent Labs  Lab 09/13/20 1940 09/14/20 0500 09/14/20 1710 09/15/20 0440  WBC 6.9 9.0 9.8 10.0    Liver Function Tests: Recent Labs  Lab 09/12/20 1946 09/13/20 0333 09/13/20 1940   AST  --  13* 16  ALT  --  9 9  ALKPHOS  --  68 45  BILITOT  --  0.4 0.5  PROT  --  5.0* 4.0*  ALBUMIN 2.5* 2.2* 2.2*   No results for input(s): LIPASE, AMYLASE in the last 168 hours. No results for input(s): AMMONIA in the last 168 hours.  ABG    Component Value Date/Time   PHART 7.311 (L) 09/13/2020 2152   PCO2ART 50.6 (H) 09/13/2020 2152   PO2ART 170 (H) 09/13/2020 2152   HCO3 25.5 09/13/2020 2152   TCO2 27 09/13/2020 2152   ACIDBASEDEF 1.0 09/13/2020 2152   O2SAT 99.0 09/13/2020 2152     Coagulation Profile: Recent Labs  Lab 09/08/20 1800 09/12/20 2021 09/13/20 1247 09/13/20 2000  INR 1.1 1.1 1.7* 1.3*    Cardiac Enzymes: No results for input(s): CKTOTAL, CKMB, CKMBINDEX, TROPONINI in the last 168 hours.  HbA1C: Hgb A1c MFr Bld  Date/Time Value Ref Range Status  09/13/2020 03:33 AM 5.2 4.8 - 5.6 % Final    Comment:    (NOTE) Pre diabetes:          5.7%-6.4%  Diabetes:              >6.4%  Glycemic control for   <7.0% adults with diabetes   09/09/2020 03:00 AM 5.2 4.8 - 5.6 % Final    Comment:    (NOTE) Pre diabetes:          5.7%-6.4%  Diabetes:              >6.4%  Glycemic control for   <7.0% adults with diabetes     CBG: Recent Labs  Lab 09/14/20 1917 09/14/20 2328 09/15/20 0329 09/15/20 0623 09/15/20 0745  GLUCAP 99 90 93 87 104*    Review of Systems:   Unable to obtain due to critical illness   Past Medical History:  She,  has a past medical history of Anemia of chronic disease, Anxiety, Arthritis, Asthma, Back pain, chronic, Candida infection (finished tx 2 days ago), Chronic insomnia, COPD (chronic obstructive pulmonary disease) ([place]), Coronary artery calcification seen on CT scan, Diabetes mellitus, Eczema, ESRD on dialysis ([place]), Essential hypertension, GERD (gastroesophageal reflux disease), Gout, History of hiatal hernia, Hypoalbuminemia, Hyponatremia, Mild aortic stenosis, Mitral regurgitation, Mitral stenosis, Obesity, PAD  (peripheral artery disease) ([place]), Pneumonia (few yrs ago x 2), Sleep apnea, Tobacco abuse, and Upper GI bleed (05/2019).   Surgical History:   Past Surgical History:  Procedure Laterality Date   BACK SURGERY  2016   lower back fusion with cage   BIOPSY  09/23/2017   Procedure: BIOPSY;  Surgeon: Wonda Horner, MD;  Location: [facility]COPY;  Service: Endoscopy;;   BIOPSY  05/19/2019   Procedure: BIOPSY;  Surgeon: Otis Brace, MD;  Location: [place] ENDOSCOPY;  Service: Gastroenterology;;   [place]  x 1    COLONOSCOPY WITH PROPOFOL N/A 09/23/2017   Procedure: COLONOSCOPY WITH PROPOFOL Hemostatic clips placed;  Surgeon: Wonda Horner, MD;  Location: [facility];  Service: Endoscopy;  Laterality: N/A;   CORONARY ARTERY BYPASS GRAFT N/A 09/13/2020   Procedure: CORONARY ARTERY BYPASS GRAFTING (CABG), ON PUMP, TIMES TWO, USING LEFT INTERNAL MAMMARY ARTERY AND RIGHT ENDOSCOPICALLY HARVESTED GREATER SAPHENOUS VEIN;  Surgeon: Gaye Pollack, MD;  Location: [place];  Service: Open Heart Surgery;  Laterality: N/A;   DILATION AND CURETTAGE OF UTERUS  1988   ESOPHAGOGASTRODUODENOSCOPY (EGD) WITH PROPOFOL N/A 09/23/2017   Procedure: ESOPHAGOGASTRODUODENOSCOPY (EGD) WITH PROPOFOL;  Surgeon: Wonda Horner, MD;  Location: [facility];  Service: Endoscopy;  Laterality: N/A;   ESOPHAGOGASTRODUODENOSCOPY (EGD) WITH PROPOFOL N/A 05/19/2019   Procedure: ESOPHAGOGASTRODUODENOSCOPY (EGD) WITH PROPOFOL;  Surgeon: Otis Brace, MD;  Location: [place];  Service: Gastroenterology;  Laterality: N/A;   GIVENS CAPSULE STUDY N/A 05/19/2019   Procedure: GIVENS CAPSULE STUDY;  Surgeon: Otis Brace, MD;  Location: [place];  Service: Gastroenterology;  Laterality: N/A;   HOT HEMOSTASIS N/A 05/19/2019   Procedure: HOT HEMOSTASIS (ARGON PLASMA COAGULATION/BICAP);  Surgeon: Otis Brace, MD;  Location: [facility];  Service: Gastroenterology;  Laterality: N/A;   LEFT HEART CATH AND CORONARY  ANGIOGRAPHY N/A 09/10/2020   Procedure: LEFT HEART CATH AND CORONARY ANGIOGRAPHY;  Surgeon: Nelva Bush, MD;  Location: [place] [facility];  Service: Cardiovascular;  Laterality: N/A;   PARTIAL KNEE ARTHROPLASTY Left 11/12/2017   Procedure: left unicompartmental arthroplasty-medial;  Surgeon: Paralee Cancel, MD;  Location: [facility];  Service: Orthopedics;  Laterality: Left;  64mn   SUBMUCOSAL INJECTION  09/23/2017   Procedure: SUBMUCOSAL INJECTION;  Surgeon: GWonda Horner MD;  Location: [facility];  Service: Endoscopy;;  in colon   TEE WITHOUT CARDIOVERSION N/A 09/13/2020   Procedure: TRANSESOPHAGEAL ECHOCARDIOGRAM (TEE);  Surgeon: BGaye Pollack MD;  [place]  Service: Open Heart Surgery;  Laterality: N/A;   TUBAL LIGATION  1990     Social History:   reports that she has been smoking cigarettes. She has a 20.00 pack-year smoking history. She has never used smokeless tobacco. She reports current alcohol use. She reports that she does not use drugs.   Family History:  Her family history includes Arthritis in her daughter and daughter; Asthma in her daughter; Diabetes in her sister; Heart disease in her sister and another family member; Lung cancer in her mother; Thyroid cancer in an other family member.   Allergies Allergies  Allergen Reactions   3-Methyl-2-Benzothiazolinone Hydrazone Other (See Comments)    Muscle weakness muscle cramping in legs Other reaction(s): Myalgias (Muscle Pain)   Banana Anaphylaxis and Swelling    TONGUE AND MOUTH SWELLING   Black Walnut Flavor Anaphylaxis    Walnuts   Hazelnut ([place]) Allergy Skin Test Anaphylaxis    Hazelnuts   Leflunomide And Related Other (See Comments)    Severe Headache   Lisinopril Swelling    Angioedema  Swelling of the face   No [employer] Food Allergies Anaphylaxis    Spicy Mustard 4.7.2021 Pt reports that she eats Regular Yellow Mustard   Other Other (See Comments), Anaphylaxis and Swelling    Cosentyx=  angioedema  Other reaction(s): Facial Edema (intolerance) Mouth itches and tongue swelling Hazel nuts and pecans also Walnuts Walnuts Renal failure Other reaction(s): Other Serotonin Syndrome  Serotonin syndrome  Tremors Other reaction(s): Other Tremors Muscle weakness muscle cramping in legs Other reaction(s): Myalgias (Muscle Pain) Mouth itches  and tongue swelling Hazel nuts and pecans also Walnuts    Pecan Extract Allergy Skin Test Anaphylaxis    Pecans    Pecan Nut (Diagnostic) Anaphylaxis    Pecans   Trazodone And Nefazodone Other (See Comments)    Serotonin Syndrome  Tremors   Adalimumab Other (See Comments)    Blisters on abdomen =humira   Escitalopram Oxalate Other (See Comments)    Hand problems - Serotonin Syndrome     Ezetimibe Other (See Comments)    myalgias cramps    Secukinumab Swelling    Cosentyx = angiodema    Statins Other (See Comments)    Leg pains and weakness   Ibuprofen Other (See Comments)    Renal Problems     Home Medications  Prior to Admission medications   Medication Sig Start Date End Date Taking? Authorizing Provider  acetaminophen (TYLENOL) 500 MG tablet Take 1,000 mg by mouth every 6 (six) hours as needed for mild pain.   Yes [provider]  albuterol (PROVENTIL) (2.5 MG/3ML) 0.083% nebulizer solution Take 2.5 mg by nebulization as needed for wheezing or shortness of breath. 04/15/19  Yes [provider]  allopurinol (ZYLOPRIM) 100 MG tablet Take 100 mg by mouth every evening.   Yes [provider]  amLODipine (NORVASC) 10 MG tablet Take 10 mg by mouth daily.   Yes [provider]  calcium acetate (PHOSLO) 667 MG capsule Take 3 capsules by mouth in the morning and at bedtime. Take 2 capsules with snacks. 10/25/18  Yes [provider]  carboxymethylcellulose 1 % ophthalmic solution Place 1 drop into both eyes 4 (four) times daily.   Yes [provider]  cetirizine (ZYRTEC) 10  MG tablet Take 10 mg by mouth 2 (two) times daily as needed (hives).  06/13/17  Yes Shelda Pal, DO  Cholecalciferol (VITAMIN D3) 10 MCG (400 UNIT) tablet Take 400 Units by mouth every evening.   Yes [provider]  cloNIDine (CATAPRES) 0.3 MG tablet Take 1 tablet (0.3 mg total) by mouth 3 (three) times daily. 05/21/19  Yes Hosie Poisson, MD  cyclobenzaprine (FLEXERIL) 10 MG tablet Take 10 mg by mouth 3 (three) times daily as needed for muscle spasms. 06/08/20  Yes [provider]  ELDERBERRY PO Take 2 drops by mouth daily.   Yes [provider]  ferrous sulfate 325 (65 FE) MG tablet Take 325 mg by mouth 3 (three) times a week. TUE THUR SAT   Yes [provider]  fluticasone (FLONASE) 50 MCG/ACT nasal spray Place 1 spray into both nostrils daily.   Yes [provider]  folic acid (FOLVITE) Q000111Q MCG tablet Take 400 mcg by mouth daily.    Yes [provider]  furosemide (LASIX) 40 MG tablet Take 40 mg by mouth 2 (two) times daily.    Yes [provider]  gabapentin (NEURONTIN) 300 MG capsule Take 1 capsule (300 mg total) by mouth at bedtime as needed (Neuropathic pain). Patient taking differently: Take 300 mg by mouth as needed (pain). 12/14/17 10/20/20 Yes Dana Allan I, MD  hydrALAZINE (APRESOLINE) 100 MG tablet Take 1 tablet (100 mg total) by mouth every 8 (eight) hours. 07/06/20  Yes [name], MD  isosorbide mononitrate (IMDUR) 60 MG 24 hr tablet Take 1 tablet (60 mg total) by mouth daily. 03/02/18  Yes Thurnell Lose, MD  labetalol (NORMODYNE) 200 MG tablet Take 400 mg by mouth 3 (three) times daily.   Yes [provider]  LORazepam (ATIVAN) 1 MG tablet Take 1 mg by mouth every 8 (eight) hours as needed for anxiety. 06/25/20  Yes [provider]  montelukast (SINGULAIR) 10 MG tablet Take 10 mg by mouth daily with supper.    Yes [provider]  multivitamin (RENA-VIT) TABS tablet Take 1  tablet by mouth daily with supper.   Yes [provider]  nitroGLYCERIN (NITROSTAT) 0.4 MG SL tablet Place 0.4 mg under the tongue every 5 (five) minutes as needed for chest pain. 08/01/20  Yes [provider]  olmesartan (BENICAR) 40 MG tablet Take 40 mg by mouth every evening. 05/06/19  Yes [provider]  oxyCODONE (ROXICODONE) 15 MG immediate release tablet Take 15 mg by mouth 3 (three) times daily as needed for pain. 05/31/20  Yes [provider]  pantoprazole (PROTONIX) 40 MG tablet Take 40 mg by mouth daily. 06/09/20  Yes [provider]  predniSONE (DELTASONE) 5 MG tablet Take 1 tablet (5 mg total) by mouth daily with breakfast. 07/06/20  Yes [name], MD  Probiotic Product (PROBIOTIC PO) Take 1 capsule by mouth daily.   Yes [provider]  Tiotropium Bromide Monohydrate (SPIRIVA RESPIMAT IN) Inhale 2 puffs into the lungs daily.   Yes [provider]  VENTOLIN HFA 108 (90 Base) MCG/ACT inhaler Inhale 1-2 puffs into the lungs every 6 (six) hours as needed for shortness of breath. wheezing 06/13/15  Yes [provider]  XELJANZ 5 MG TABS Take 5 mg by mouth at bedtime. 05/17/19  Yes [provider]  zolpidem (AMBIEN) 5 MG tablet Take 5 mg by mouth at bedtime. 05/05/19  Yes [provider]  cinacalcet (SENSIPAR) 30 MG tablet Take 30 mg by mouth 3 (three) times a week. Tuesday, Thursday, Saturday. Patient not taking: Reported on 09/09/2020    [provider]  EPINEPHrine (EPIPEN) 0.3 mg/0.3 mL SOAJ injection Inject 0.3 mLs (0.3 mg total) into the muscle once. Patient taking differently: Inject 0.3 mg into the muscle as needed (for allergic reaction). 09/30/12   Le, Thao P, DO  JANUVIA 25 MG tablet Take 1 tablet (25 mg total) by mouth daily. Patient not taking: Reported on 09/09/2020 07/06/20   [name], MD     This patient is critically ill with multiple organ system failure; which, requires  frequent high complexity decision making, assessment, support, evaluation, and titration of therapies. This was completed through the application of advanced monitoring technologies and extensive interpretation of multiple databases. During this encounter critical care time was devoted to patient care services described in this note for 34 minutes.      Garner Nash, DO [facility] 09/15/2020 9:58 AM

## 2020-09-15 NOTE — Consult Note (Addendum)
Neurology Consultation Reason for Consult: Left sided weakness Referring Physician: Code Stroke(Bartle, B attending)  CC: Seizure  History is obtained from:Nursing  HPI: Jodi Bell is a 60 y.o. female who was in her normal sate of health until 5am. She sat up and then began having spaspm of her legs. She then had right arm flexion and head turn to the right and became unresponsive. She had seizure-like activity lasting approximately 1 minute. Following this, she remained confused and there was question of left sided weakness and therefore code stroke was activated.   In CT, she initially was able to tell me her name, but not much more. Subsequently, she became unable to answer any questions other than with a perseverated "huh?" Or "what's happened?"  ROS:  Unable to obtain due to altered mental status.   Past Medical History:  Diagnosis Date   Anemia of chronic disease    Anxiety    Arthritis    oa and psoriatic ra   Asthma    Back pain, chronic    lower back   Candida infection finished tx 2 days ago   Chronic insomnia    COPD (chronic obstructive pulmonary disease) (HCC)    Coronary artery calcification seen on CT scan    Diabetes mellitus    type 2   Eczema    ESRD on dialysis [facility])    Essential hypertension    GERD (gastroesophageal reflux disease)    Gout    History of hiatal hernia    Hypoalbuminemia    Hyponatremia    Mild aortic stenosis    Mitral regurgitation    Mitral stenosis    Obesity    PAD (peripheral artery disease) ([place])    a. externial iliac calcification seen on CT 04/2020.   Pneumonia few yrs ago x 2   Sleep apnea    Tobacco abuse    Upper GI bleed 05/2019     Family History  Problem Relation Age of Onset   Lung cancer Mother    Diabetes Sister    Heart disease Sister    Asthma Daughter    Arthritis Daughter    Arthritis Daughter    Thyroid cancer Other        1/2 sister   Heart disease Other        1/2 sister     Social  History:  reports that she has been smoking cigarettes. She has a 20.00 pack-year smoking history. She has never used smokeless tobacco. She reports current alcohol use. She reports that she does not use drugs.   Exam: Current vital signs: BP 133/67   Pulse 71   Temp 98.6 F (37 C) (Oral)   Resp 17   Ht '5\' 2"'$  (1.575 m)   Wt 65.7 kg   SpO2 99%   BMI 26.49 kg/m  Vital signs in last 24 hours: Temp:  [97.7 F (36.5 C)-98.6 F (37 C)] 98.6 F (37 C) (08/06 0357) Pulse Rate:  [65-73] 71 (08/06 0500) Resp:  [8-23] 17 (08/06 0500) BP: (109-136)/(62-77) 133/67 (08/06 0500) SpO2:  [94 %-100 %] 99 % (08/06 0500) Arterial Line BP: (122-140)/(45-49) 122/48 (08/05 0800)   Physical Exam  Constitutional: Appears well-developed and well-nourished.  Psych: Affect appropriate to situation Eyes: No scleral injection HENT: No OP obstruction MSK: no joint deformities.  Cardiovascular: Normal rate and regular rhythm.  Respiratory: Effort normal, non-labored breathing GI: Soft.  No distension. There is no tenderness.  Skin: covered wound on chest  Neuro: Mental Status: Patient is awake, alert, she initially tells me her name, though subsequently cannot do so. She perseverates on the word "huh?" And "Whats going" Cranial Nerves: II: Visual Fields are full. Pupils are equal, round, and reactive to light.   III,IV, VI: EOMI without ptosis or diploplia.  V: Facial sensation is symmetric to temperature VII: Facial movement with some right facial weakness.  Motor: She moves all extremities all extremities spontaneously. When asked to wiggle toes, she wiggles the right but not left.  Sensory: Responds to nox stim x 4.  Cerebellar: Does not perform.    I have reviewed labs in epic and the results pertinent to this consultation are: Na 130 Ca 9.7 Mg 2.0  I have reviewed the images obtained:CT/CTA - negative  STAT EEG - some fast activity of uncertain significance, but no definite seizure.    Impression: 60 yo F with recent CABG with new onset seizure and AMS. I am concernd for ongoing seizure activity and therefore she was given ativan '2mg'$ , and loaded with Keppra. In addition with subtle clonic movements of the right arm concerning for ongoing seizure, I will load with fosphenytoin as well.   She is somnolent currently but is still protecting her airway at the current time. Will continue to follow.   Certainly with her recent cardiac surgery, small strokes are a possible etiology for new onset focal seizure. Other possibilities would include an underlying predisposition with unmasking in the setting of physiological stress.    She would not be a thrombolytic candidate as she just had surgery.   Recommendations: 1) Prolonged VEEG  2) Continue keppra 1g daily 3) Phenytoin '100mg'$  TID.  4) HD 5) MRI brain once able.  6) discontinue tramadol.   Roland Rack, MD [facility] [phone]  If 7pm- 7am, please page neurology on call as listed in [place].

## 2020-09-15 NOTE — Progress Notes (Signed)
vLTM EEG started following Ceribell. No skin breakdown was seen.Pt monitored/ notified Neuro

## 2020-09-15 NOTE — Procedures (Addendum)
Patient Name: Jodi Bell  MRN: [id number]  Epilepsy Attending: Lora Havens  Referring Physician/Provider: Dr Linnell Fulling Duration: 09/14/2020 617 09/15/2020 0756  Patient history: 60 yo F with recent CABG with new onset seizure and AMS. EEG to evaluate for seizure  Level of alertness: Awake  AEDs during EEG study: LEV, PHT, Ativan  Technical aspects: This EEG was obtained using a 10 lead EEG system positioned circumferentially without any parasagittal coverage (rapid EEG). Computer selected EEG is reviewed as  well as background features and all clinically significant events.  Description: No posterior dominant rhythm was seen. EEG showed continuous generalized 3 to 6 Hz theta-delta slowing. Hyperventilation and photic stimulation were not performed.     ABNORMALITY - Continuous slow, generalized  IMPRESSION: This limited ceribell EEG is suggestive of moderate diffuse encephalopathy, nonspecific etiology. No seizures or epileptiform discharges were seen throughout the recording.  If concern for ictal-interictal activity persists, long term monitoring with traditional eeg should be considered.  [name] [name]

## 2020-09-15 NOTE — Progress Notes (Signed)
Patient ID: Jodi Bell, female   DOB: Jan 13, 1961, 60 y.o.   MRN: [id number]  [facility] Progress Note   Assessment/ Plan:   1.  Non-ST elevation MI status post two-vessel CABG yesterday: Presented with volume overload/acute hypoxic respiratory failure and doing well from a postoperative standpoint. 2. ESRD: On hemodialysis on a Monday/Wednesday/Friday schedule as an outpatient and was unable to undergo dialysis yesterday, on schedule for hemodialysis later today-no acute lab abnormality/significant volume excess. 3. Anemia: Slight drop of hemoglobin/hematocrit noted this morning on labs, continue ESA 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 5. Nutrition: Currently n.p.o. with mental status changes/poor ability to protect airway. 6.  Acute neurological changes: With evidence of seizure and likely CVA (imaging pending)-ongoing neurology evaluation.  Subjective:   She suffered a seizure +/- severe this morning with acute neurological changes.   Objective:   BP 128/67   Pulse 66   Temp 98.6 F (37 C) (Oral)   Resp (!) 7   Ht '5\' 2"'$  (1.575 m)   Wt 67.4 kg   SpO2 98%   BMI 27.18 kg/m   Physical Exam: Gen: Somnolent/unarousable this morning CVS: Pulse regular rhythm, distant heart sounds with intact sternotomy dressing Resp: Transmitted breath sounds without distinct rales or rhonchi Abd: Soft, obese, nontender, bowel sounds normal Ext: Trace lower extremity edema, left upper arm AV fistula pulsatile  Labs: BMET Recent Labs  Lab 09/12/20 0228 09/12/20 1946 09/13/20 0333 09/13/20 0807 09/13/20 0948 09/13/20 1014 09/13/20 1025 09/13/20 1044 09/13/20 1124 09/13/20 1251 09/13/20 1940 09/13/20 2009 09/13/20 2105 09/13/20 2152 09/14/20 0500 09/14/20 1710 09/15/20 0440  NA 129* 132* 130*   < > 130*   < > 133*   < > 133*   < > 134* 135 133* 134* 132* 132* 130*  K 4.3  3.4* 3.3*   < > 3.2*   < > 4.3   < > 4.1   < > 3.7 3.7 3.7 3.8 3.7 4.8 5.0  CL 92* 97* 92*   < > 96*  --  97*  --  96*  --  100  --   --   --  98 98 96*  CO2 '26 26 29  '$ --   --   --   --   --   --   --  25  --   --   --  '25 24 24  '$ GLUCOSE 122* 153* 111*   < > 103*  --  112*  --  143*  --  84  --   --   --  102* 76 88  BUN 22* 7 <5*   < > 9  --  8  --  9  --  13  --   --   --  15 20 25*  CREATININE 5.59* 2.57* 3.13*   < > 3.30*  --  3.10*  --  3.10*  --  3.42*  --   --   --  3.92* 4.72* 5.26*  CALCIUM 10.4* 9.3 9.8  --   --   --   --   --   --   --  8.8*  --   --   --  9.1 9.8 9.7  PHOS  --  1.5*  --   --   --   --   --   --   --   --  3.2  --   --   --   --   --   --    < > =  values in this interval not displayed.   CBC Recent Labs  Lab 09/08/20 1221 09/09/20 0300 09/13/20 1940 09/13/20 2009 09/13/20 2152 09/14/20 0500 09/14/20 1710 09/15/20 0440  WBC 7.1   < > 6.9  --   --  9.0 9.8 10.0  NEUTROABS 4.7  --   --   --   --   --   --   --   HGB 9.2*   < > 11.0*   < > 11.9* 11.2* 11.2* 10.6*  HCT 28.1*   < > 32.7*   < > 35.0* 34.6* 34.1* 32.4*  MCV 101.1*   < > 88.6  --   --  90.3 89.5 90.5  PLT 243   < > 119*  --   --  128* 114* 107*   < > = values in this interval not displayed.      Medications:     sodium chloride   Intravenous Once   acetaminophen  1,000 mg Oral Q6H   Or   acetaminophen (TYLENOL) oral liquid 160 mg/5 mL  1,000 mg Per Tube Q6H   acetaminophen (TYLENOL) oral liquid 160 mg/5 mL  650 mg Per Tube Once   Or   acetaminophen  650 mg Rectal Once   allopurinol  100 mg Oral QPM   aspirin EC  325 mg Oral Daily   Or   aspirin  324 mg Per Tube Daily   bisacodyl  10 mg Oral Daily   Or   bisacodyl  10 mg Rectal Daily   calcium acetate  2,001 mg Oral TID WC   chlorhexidine  15 mL Mouth Rinse BID   Chlorhexidine Gluconate Cloth  6 each Topical Daily   cholecalciferol  1,000 Units Oral QPM   cinacalcet  30 mg Oral Q T,Th,Sa-HD   darbepoetin (ARANESP) injection -  DIALYSIS  100 mcg Intravenous Q Fri-HD   docusate sodium  200 mg Oral Daily   ferrous sulfate  325 mg Oral Once per day on Mon Wed Fri   fluticasone  1 spray Each Nare Daily   folic acid  1 mg Oral Daily   insulin aspart  0-24 Units Subcutaneous Q4H   mouth rinse  15 mL Mouth Rinse q12n4p   metoprolol tartrate  12.5 mg Oral BID   Or   metoprolol tartrate  12.5 mg Per Tube BID   montelukast  10 mg Oral Q supper   nystatin  5 mL Oral QID   pantoprazole  40 mg Oral Daily   phenytoin (DILANTIN) IV  100 mg Intravenous Q8H   polyethylene glycol  17 g Oral Daily   polyvinyl alcohol  1 drop Both Eyes TID AC & HS   predniSONE  5 mg Oral Q breakfast   saccharomyces boulardii  250 mg Oral Daily   sodium chloride flush  3 mL Intravenous Q12H   umeclidinium bromide  1 puff Inhalation Daily   Elmarie Shiley, MD 09/15/2020, 7:37 AM

## 2020-09-15 NOTE — H&P (View-Only) (Signed)
NAME:  Jodi Bell, MRN:  [id number], DOB:  Jun 11, 1960, LOS: 7 ADMISSION DATE:  09/08/2020, CONSULTATION DATE: 09/15/2020 REFERRING MD: Dr. Kipp Brood, CHIEF COMPLAINT: Possible seizure  History of Present Illness:  This is a 60 year old female, past medical history of ESRD on dialysis, CAD, COPD, type 2 diabetes.  Patient is here in the hospital status post CABG.  Last night she was doing well until around 5 AM.  She started having spasming in her legs right arm flexion head turn to the right became unresponsive.  Felt to have seizure-like activity lasting approximately 1 minute.  Post seizure had left-sided weakness and a code stroke was called.  Patient was seen by neurology and sent down for a CT scan of the head which was negative.  Also had a spot EEG.  Now plan for long-term video EEG.  Critical care was consulted for recommendations and management.  She did receive Ativan and was more somnolent after this event.  Pertinent  Medical History   Past Medical History:  Diagnosis Date   Anemia of chronic disease    Anxiety    Arthritis    oa and psoriatic ra   Asthma    Back pain, chronic    lower back   Candida infection finished tx 2 days ago   Chronic insomnia    COPD (chronic obstructive pulmonary disease) (HCC)    Coronary artery calcification seen on CT scan    Diabetes mellitus    type 2   Eczema    ESRD on dialysis [facility] [address])    Essential hypertension    GERD (gastroesophageal reflux disease)    Gout    History of hiatal hernia    Hypoalbuminemia    Hyponatremia    Mild aortic stenosis    Mitral regurgitation    Mitral stenosis    Obesity    PAD (peripheral artery disease) ([place])    a. externial iliac calcification seen on CT 04/2020.   Pneumonia few yrs ago x 2   Sleep apnea    Tobacco abuse    Upper GI bleed 05/2019     Significant Hospital Events: Including procedures, antibiotic start and stop dates in addition to other pertinent events   8/6 Early morning,  seizure-like activity.  Interim History / Subjective:  Somnolent responsive to voice.  On EEG  Objective   Blood pressure 133/68, pulse 64, temperature (!) 97.5 F (36.4 C), temperature source Axillary, resp. rate (!) 23, height '5\' 2"'$  (1.575 m), weight 67.5 kg, SpO2 100 %.        Intake/Output Summary (Last 24 hours) at 09/15/2020 0944 Last data filed at 09/15/2020 0600 Gross per 24 hour  Intake 1256.85 ml  Output 15 ml  Net 1241.85 ml   Filed Weights   09/14/20 0500 09/15/20 0500 09/15/20 0901  Weight: 65.7 kg 67.4 kg 67.5 kg    Examination: General: Chronically ill-appearing, elderly female, resting in bed HENT: NCAT Lungs: Clear to auscultation bilaterally Cardiovascular: Regular rate rhythm S1-S2 Abdomen: Soft nontender nondistended Extremities: No significant edema Neuro: Will arouse to stimulation, otherwise does not follow commands GU: Deferred  Resolved Hospital Problem list     Assessment & Plan:   Acute metabolic encephalopathy Possible CVA, possible seizure Plan: Long-term video EEG Keppra Phenytoin Brain MRI once stable, once pacer wires removed Tramadol discontinued Appreciate recs and coordination by neurology  Acute hypoxemic respiratory failure on nasal cannula Plan Start end-tidal CO2 monitoring Continue to titrate O2 maintain SPO2 greater than 90%  Monitor closely in the ICU, high risk need for intubation I suspect its from hypoventilation and postictal state  NSTEMI Coronary artery disease status post CABG Plan: Postop care per thoracic surgery  End-stage renal disease on IHD Plan: Nephrology consulted Plan for IHD today.  Best Practice (right click and "Reselect all SmartList Selections" daily)   Diet/type: NPO DVT prophylaxis: SCD GI prophylaxis: PPI Lines: yes and it is still needed Foley:  removal ordered  Code Status:  full code Last date of multidisciplinary goals of care discussion [per primary]  Labs   CBC: Recent Labs   Lab 09/08/20 1221 09/09/20 0300 09/13/20 1247 09/13/20 1251 09/13/20 1940 09/13/20 2009 09/13/20 2105 09/13/20 2152 09/14/20 0500 09/14/20 1710 09/15/20 0440  WBC 7.1   < > 7.0  --  6.9  --   --   --  9.0 9.8 10.0  NEUTROABS 4.7  --   --   --   --   --   --   --   --   --   --   HGB 9.2*   < > 8.1*   < > 11.0*   < > 11.2* 11.9* 11.2* 11.2* 10.6*  HCT 28.1*   < > 24.5*   < > 32.7*   < > 33.0* 35.0* 34.6* 34.1* 32.4*  MCV 101.1*   < > 90.7  --  88.6  --   --   --  90.3 89.5 90.5  PLT 243   < > 87*  --  119*  --   --   --  128* 114* 107*   < > = values in this interval not displayed.    Basic Metabolic Panel: Recent Labs  Lab 09/08/20 1221 09/09/20 0300 09/12/20 1946 09/13/20 0333 09/13/20 0807 09/13/20 1124 09/13/20 1251 09/13/20 1940 09/13/20 2009 09/13/20 2105 09/13/20 2152 09/14/20 0500 09/14/20 1710 09/15/20 0440  NA 134*   < > 132* 130*   < > 133*   < > 134*   < > 133* 134* 132* 132* 130*  K 4.4   < > 3.4* 3.3*   < > 4.1   < > 3.7   < > 3.7 3.8 3.7 4.8 5.0  CL 97*   < > 97* 92*   < > 96*  --  100  --   --   --  98 98 96*  CO2 29   < > 26 29  --   --   --  25  --   --   --  '25 24 24  '$ GLUCOSE 103*   < > 153* 111*   < > 143*  --  84  --   --   --  102* 76 88  BUN 18   < > 7 <5*   < > 9  --  13  --   --   --  15 20 25*  CREATININE 3.86*   < > 2.57* 3.13*   < > 3.10*  --  3.42*  --   --   --  3.92* 4.72* 5.26*  CALCIUM 10.3   < > 9.3 9.8  --   --   --  8.8*  --   --   --  9.1 9.8 9.7  MG 1.9  --   --   --   --   --   --   --   --   --   --  1.8 2.0  --  PHOS  --   --  1.5*  --   --   --   --  3.2  --   --   --   --   --   --    < > = values in this interval not displayed.   GFR: Estimated Creatinine Clearance: 10.3 mL/min (A) (by C-G formula based on SCr of 5.26 mg/dL (H)). Recent Labs  Lab 09/13/20 1940 09/14/20 0500 09/14/20 1710 09/15/20 0440  WBC 6.9 9.0 9.8 10.0    Liver Function Tests: Recent Labs  Lab 09/12/20 1946 09/13/20 0333 09/13/20 1940   AST  --  13* 16  ALT  --  9 9  ALKPHOS  --  68 45  BILITOT  --  0.4 0.5  PROT  --  5.0* 4.0*  ALBUMIN 2.5* 2.2* 2.2*   No results for input(s): LIPASE, AMYLASE in the last 168 hours. No results for input(s): AMMONIA in the last 168 hours.  ABG    Component Value Date/Time   PHART 7.311 (L) 09/13/2020 2152   PCO2ART 50.6 (H) 09/13/2020 2152   PO2ART 170 (H) 09/13/2020 2152   HCO3 25.5 09/13/2020 2152   TCO2 27 09/13/2020 2152   ACIDBASEDEF 1.0 09/13/2020 2152   O2SAT 99.0 09/13/2020 2152     Coagulation Profile: Recent Labs  Lab 09/08/20 1800 09/12/20 2021 09/13/20 1247 09/13/20 2000  INR 1.1 1.1 1.7* 1.3*    Cardiac Enzymes: No results for input(s): CKTOTAL, CKMB, CKMBINDEX, TROPONINI in the last 168 hours.  HbA1C: Hgb A1c MFr Bld  Date/Time Value Ref Range Status  09/13/2020 03:33 AM 5.2 4.8 - 5.6 % Final    Comment:    (NOTE) Pre diabetes:          5.7%-6.4%  Diabetes:              >6.4%  Glycemic control for   <7.0% adults with diabetes   09/09/2020 03:00 AM 5.2 4.8 - 5.6 % Final    Comment:    (NOTE) Pre diabetes:          5.7%-6.4%  Diabetes:              >6.4%  Glycemic control for   <7.0% adults with diabetes     CBG: Recent Labs  Lab 09/14/20 1917 09/14/20 2328 09/15/20 0329 09/15/20 0623 09/15/20 0745  GLUCAP 99 90 93 87 104*    Review of Systems:   Unable to obtain due to critical illness   Past Medical History:  She,  has a past medical history of Anemia of chronic disease, Anxiety, Arthritis, Asthma, Back pain, chronic, Candida infection (finished tx 2 days ago), Chronic insomnia, COPD (chronic obstructive pulmonary disease) ([place]), Coronary artery calcification seen on CT scan, Diabetes mellitus, Eczema, ESRD on dialysis ([place]), Essential hypertension, GERD (gastroesophageal reflux disease), Gout, History of hiatal hernia, Hypoalbuminemia, Hyponatremia, Mild aortic stenosis, Mitral regurgitation, Mitral stenosis, Obesity, PAD  (peripheral artery disease) ([place]), Pneumonia (few yrs ago x 2), Sleep apnea, Tobacco abuse, and Upper GI bleed (05/2019).   Surgical History:   Past Surgical History:  Procedure Laterality Date   BACK SURGERY  2016   lower back fusion with cage   BIOPSY  09/23/2017   Procedure: BIOPSY;  Surgeon: Wonda Horner, MD;  Location: [facility];  Service: Endoscopy;;   BIOPSY  05/19/2019   Procedure: BIOPSY;  Surgeon: Otis Brace, MD;  Location: [place];  Service: Gastroenterology;;   [place]  x 1    COLONOSCOPY WITH PROPOFOL N/A 09/23/2017   Procedure: COLONOSCOPY WITH PROPOFOL Hemostatic clips placed;  Surgeon: Wonda Horner, MD;  Location: [facility];  Service: Endoscopy;  Laterality: N/A;   CORONARY ARTERY BYPASS GRAFT N/A 09/13/2020   Procedure: CORONARY ARTERY BYPASS GRAFTING (CABG), ON PUMP, TIMES TWO, USING LEFT INTERNAL MAMMARY ARTERY AND RIGHT ENDOSCOPICALLY HARVESTED GREATER SAPHENOUS VEIN;  Surgeon: Gaye Pollack, MD;  Location: [facility] [facility];  Service: Open Heart Surgery;  Laterality: N/A;   DILATION AND CURETTAGE OF UTERUS  1988   ESOPHAGOGASTRODUODENOSCOPY (EGD) WITH PROPOFOL N/A 09/23/2017   Procedure: ESOPHAGOGASTRODUODENOSCOPY (EGD) WITH PROPOFOL;  Surgeon: Wonda Horner, MD;  [facility];  Service: Endoscopy;  Laterality: N/A;   ESOPHAGOGASTRODUODENOSCOPY (EGD) WITH PROPOFOL N/A 05/19/2019   Procedure: ESOPHAGOGASTRODUODENOSCOPY (EGD) WITH PROPOFOL;  Surgeon: Otis Brace, MD;  Location: [facility];  Service: Gastroenterology;  Laterality: N/A;   GIVENS CAPSULE STUDY N/A 05/19/2019   Procedure: GIVENS CAPSULE STUDY;  Surgeon: Otis Brace, MD;  Location: [place];  Service: Gastroenterology;  Laterality: N/A;   HOT HEMOSTASIS N/A 05/19/2019   Procedure: HOT HEMOSTASIS (ARGON PLASMA COAGULATION/BICAP);  Surgeon: Otis Brace, MD;  Location: [facility];  Service: Gastroenterology;  Laterality: N/A;   LEFT HEART CATH AND CORONARY  ANGIOGRAPHY N/A 09/10/2020   Procedure: LEFT HEART CATH AND CORONARY ANGIOGRAPHY;  Surgeon: Nelva Bush, MD;  Location: [facility];  Service: Cardiovascular;  Laterality: N/A;   PARTIAL KNEE ARTHROPLASTY Left 11/12/2017   Procedure: left unicompartmental arthroplasty-medial;  Surgeon: Paralee Cancel, MD;  Location: [facility];  Service: Orthopedics;  Laterality: Left;  62mn   SUBMUCOSAL INJECTION  09/23/2017   Procedure: SUBMUCOSAL INJECTION;  Surgeon: GWonda Horner MD;  Location[facility];  Service: Endoscopy;;  in colon   TEE WITHOUT CARDIOVERSION N/A 09/13/2020   Procedure: TRANSESOPHAGEAL ECHOCARDIOGRAM (TEE);  Surgeon: BGaye Pollack MD;  Location[place] [facility]  Service: Open Heart Surgery;  Laterality: N/A;   TUBAL LIGATION  1990     Social History:   reports that she has been smoking cigarettes. She has a 20.00 pack-year smoking history. She has never used smokeless tobacco. She reports current alcohol use. She reports that she does not use drugs.   Family History:  Her family history includes Arthritis in her daughter and daughter; Asthma in her daughter; Diabetes in her sister; Heart disease in her sister and another family member; Lung cancer in her mother; Thyroid cancer in an other family member.   Allergies Allergies  Allergen Reactions   3-Methyl-2-Benzothiazolinone Hydrazone Other (See Comments)    Muscle weakness muscle cramping in legs Other reaction(s): Myalgias (Muscle Pain)   Banana Anaphylaxis and Swelling    TONGUE AND MOUTH SWELLING   Black Walnut Flavor Anaphylaxis    Walnuts   Hazelnut ([place]) Allergy Skin Test Anaphylaxis    Hazelnuts   Leflunomide And Related Other (See Comments)    Severe Headache   Lisinopril Swelling    Angioedema  Swelling of the face   No [employer] Food Allergies Anaphylaxis    Spicy Mustard 4.7.2021 Pt reports that she eats Regular Yellow Mustard   Other Other (See Comments), Anaphylaxis and Swelling    Cosentyx=  angioedema  Other reaction(s): Facial Edema (intolerance) Mouth itches and tongue swelling Hazel nuts and pecans also Walnuts Walnuts Renal failure Other reaction(s): Other Serotonin Syndrome  Serotonin syndrome  Tremors Other reaction(s): Other Tremors Muscle weakness muscle cramping in legs Other reaction(s): Myalgias (Muscle Pain) Mouth itches  and tongue swelling Hazel nuts and pecans also Walnuts    Pecan Extract Allergy Skin Test Anaphylaxis    Pecans    Pecan Nut (Diagnostic) Anaphylaxis    Pecans   Trazodone And Nefazodone Other (See Comments)    Serotonin Syndrome  Tremors   Adalimumab Other (See Comments)    Blisters on abdomen =humira   Escitalopram Oxalate Other (See Comments)    Hand problems - Serotonin Syndrome     Ezetimibe Other (See Comments)    myalgias cramps    Secukinumab Swelling    Cosentyx = angiodema    Statins Other (See Comments)    Leg pains and weakness   Ibuprofen Other (See Comments)    Renal Problems     Home Medications  Prior to Admission medications   Medication Sig Start Date End Date Taking? Authorizing Provider  acetaminophen (TYLENOL) 500 MG tablet Take 1,000 mg by mouth every 6 (six) hours as needed for mild pain.   Yes [provider]  albuterol (PROVENTIL) (2.5 MG/3ML) 0.083% nebulizer solution Take 2.5 mg by nebulization as needed for wheezing or shortness of breath. 04/15/19  Yes [provider]  allopurinol (ZYLOPRIM) 100 MG tablet Take 100 mg by mouth every evening.   Yes [provider]  amLODipine (NORVASC) 10 MG tablet Take 10 mg by mouth daily.   Yes [provider]  calcium acetate (PHOSLO) 667 MG capsule Take 3 capsules by mouth in the morning and at bedtime. Take 2 capsules with snacks. 10/25/18  Yes [provider]  carboxymethylcellulose 1 % ophthalmic solution Place 1 drop into both eyes 4 (four) times daily.   Yes [provider]  cetirizine (ZYRTEC) 10  MG tablet Take 10 mg by mouth 2 (two) times daily as needed (hives).  06/13/17  Yes Shelda Pal, DO  Cholecalciferol (VITAMIN D3) 10 MCG (400 UNIT) tablet Take 400 Units by mouth every evening.   Yes [provider]  cloNIDine (CATAPRES) 0.3 MG tablet Take 1 tablet (0.3 mg total) by mouth 3 (three) times daily. 05/21/19  Yes Hosie Poisson, MD  cyclobenzaprine (FLEXERIL) 10 MG tablet Take 10 mg by mouth 3 (three) times daily as needed for muscle spasms. 06/08/20  Yes [provider]  ELDERBERRY PO Take 2 drops by mouth daily.   Yes [provider]  ferrous sulfate 325 (65 FE) MG tablet Take 325 mg by mouth 3 (three) times a week. TUE THUR SAT   Yes [provider]  fluticasone (FLONASE) 50 MCG/ACT nasal spray Place 1 spray into both nostrils daily.   Yes [provider]  folic acid (FOLVITE) Q000111Q MCG tablet Take 400 mcg by mouth daily.    Yes [provider]  furosemide (LASIX) 40 MG tablet Take 40 mg by mouth 2 (two) times daily.    Yes [provider]  gabapentin (NEURONTIN) 300 MG capsule Take 1 capsule (300 mg total) by mouth at bedtime as needed (Neuropathic pain). Patient taking differently: Take 300 mg by mouth as needed (pain). 12/14/17 10/20/20 Yes Dana Allan I, MD  hydrALAZINE (APRESOLINE) 100 MG tablet Take 1 tablet (100 mg total) by mouth every 8 (eight) hours. 07/06/20  Yes [name], MD  isosorbide mononitrate (IMDUR) 60 MG 24 hr tablet Take 1 tablet (60 mg total) by mouth daily. 03/02/18  Yes Thurnell Lose, MD  labetalol (NORMODYNE) 200 MG tablet Take 400 mg by mouth 3 (three) times daily.   Yes [provider]  LORazepam (ATIVAN) 1 MG tablet Take 1 mg by mouth every 8 (eight) hours as needed for anxiety. 06/25/20  Yes [provider]  montelukast (SINGULAIR) 10 MG tablet Take 10 mg by mouth daily with supper.    Yes [provider]  multivitamin (RENA-VIT) TABS tablet Take 1  tablet by mouth daily with supper.   Yes [provider]  nitroGLYCERIN (NITROSTAT) 0.4 MG SL tablet Place 0.4 mg under the tongue every 5 (five) minutes as needed for chest pain. 08/01/20  Yes [provider]  olmesartan (BENICAR) 40 MG tablet Take 40 mg by mouth every evening. 05/06/19  Yes [provider]  oxyCODONE (ROXICODONE) 15 MG immediate release tablet Take 15 mg by mouth 3 (three) times daily as needed for pain. 05/31/20  Yes [provider]  pantoprazole (PROTONIX) 40 MG tablet Take 40 mg by mouth daily. 06/09/20  Yes [provider]  predniSONE (DELTASONE) 5 MG tablet Take 1 tablet (5 mg total) by mouth daily with breakfast. 07/06/20  Yes [name], MD  Probiotic Product (PROBIOTIC PO) Take 1 capsule by mouth daily.   Yes [provider]  Tiotropium Bromide Monohydrate (SPIRIVA RESPIMAT IN) Inhale 2 puffs into the lungs daily.   Yes [provider]  VENTOLIN HFA 108 (90 Base) MCG/ACT inhaler Inhale 1-2 puffs into the lungs every 6 (six) hours as needed for shortness of breath. wheezing 06/13/15  Yes [provider]  XELJANZ 5 MG TABS Take 5 mg by mouth at bedtime. 05/17/19  Yes [provider]  zolpidem (AMBIEN) 5 MG tablet Take 5 mg by mouth at bedtime. 05/05/19  Yes [provider]  cinacalcet (SENSIPAR) 30 MG tablet Take 30 mg by mouth 3 (three) times a week. Tuesday, Thursday, Saturday. Patient not taking: Reported on 09/09/2020    [provider]  EPINEPHrine (EPIPEN) 0.3 mg/0.3 mL SOAJ injection Inject 0.3 mLs (0.3 mg total) into the muscle once. Patient taking differently: Inject 0.3 mg into the muscle as needed (for allergic reaction). 09/30/12   Le, Thao P, DO  JANUVIA 25 MG tablet Take 1 tablet (25 mg total) by mouth daily. Patient not taking: Reported on 09/09/2020 07/06/20   [name], MD     This patient is critically ill with multiple organ system failure; which, requires  frequent high complexity decision making, assessment, support, evaluation, and titration of therapies. This was completed through the application of advanced monitoring technologies and extensive interpretation of multiple databases. During this encounter critical care time was devoted to patient care services described in this note for 34 minutes.      Garner Nash, DO [facility] 09/15/2020 9:58 AM

## 2020-09-15 NOTE — Progress Notes (Signed)
      [address]       [place] [address]             [phone]                 2 Days Post-Op Procedure(s) (LRB): CORONARY ARTERY BYPASS GRAFTING (CABG), ON PUMP, TIMES TWO, USING LEFT INTERNAL MAMMARY ARTERY AND RIGHT ENDOSCOPICALLY HARVESTED GREATER SAPHENOUS VEIN (N/A) TRANSESOPHAGEAL ECHOCARDIOGRAM (TEE) (N/A)   Events: Seizure like activity overnight.  Facial droop, and motor weakness noted.  Code stroke called.  CT head negative _______________________________________________________________ Vitals: BP 128/67   Pulse 66   Temp 97.7 F (36.5 C) (Oral)   Resp (!) 7   Ht '5\' 2"'$  (1.575 m)   Wt 67.4 kg   SpO2 98%   BMI 27.18 kg/m   - Neuro: lethargic.  Breathing spontaneously  - Cardiovascular: sinus  Drips: none.      - Pulm: brady.  Appropriate end tidal    ABG    Component Value Date/Time   PHART 7.311 (L) 09/13/2020 2152   PCO2ART 50.6 (H) 09/13/2020 2152   PO2ART 170 (H) 09/13/2020 2152   HCO3 25.5 09/13/2020 2152   TCO2 27 09/13/2020 2152   ACIDBASEDEF 1.0 09/13/2020 2152   O2SAT 99.0 09/13/2020 2152    - Abd: ND - Extremity: warm  .Intake/Output      08/05 0701 08/06 0700 08/06 0701 08/07 0700   I.V. (mL/kg) 1062.8 (15.8)    Blood     Other 0    NG/GT     IV Piggyback 300    Total Intake(mL/kg) 1362.8 (20.2)    Urine (mL/kg/hr) 15 (0)    Emesis/NG output     Blood     Chest Tube 100    Total Output 115    Net +1247.8            _______________________________________________________________ Labs: CBC Latest Ref Rng & Units 09/15/2020 09/14/2020 09/14/2020  WBC 4.0 - 10.5 K/uL 10.0 9.8 9.0  Hemoglobin 12.0 - 15.0 g/dL 10.6(L) 11.2(L) 11.2(L)  Hematocrit 36.0 - 46.0 % 32.4(L) 34.1(L) 34.6(L)  Platelets 150 - 400 K/uL 107(L) 114(L) 128(L)   CMP Latest Ref Rng & Units 09/15/2020 09/14/2020 09/14/2020  Glucose 70 - 99 mg/dL 88 76 102(H)  BUN 6 - 20 mg/dL 25(H) 20 15  Creatinine 0.44 - 1.00 mg/dL 5.26(H) 4.72(H) 3.92(H)   Sodium 135 - 145 mmol/L 130(L) 132(L) 132(L)  Potassium 3.5 - 5.1 mmol/L 5.0 4.8 3.7  Chloride 98 - 111 mmol/L 96(L) 98 98  CO2 22 - 32 mmol/L '24 24 25  '$ Calcium 8.9 - 10.3 mg/dL 9.7 9.8 9.1  Total Protein 6.5 - 8.1 g/dL - - -  Total Bilirubin 0.3 - 1.2 mg/dL - - -  Alkaline Phos 38 - 126 U/L - - -  AST 15 - 41 U/L - - -  ALT 0 - 44 U/L - - -    CXR: Pulm vascular congestion  _______________________________________________________________  Assessment and Plan: POD 2 s/p CABG.  ESRD  Neuro: will obtain MRI today.  Pacing wires were note remove last night.  Appreciate neuro CV: stable Pulm: low threshold for intubation.  Appreciate PCCM Renal: on dialysis today GI: NPO for now.  Will require swallow eval Heme: stable ID: [email or web address] Endo: SSI Dispo: continue ICU care   Lajuana Matte 09/15/2020 8:55 AM

## 2020-09-16 DIAGNOSIS — Z951 Presence of aortocoronary bypass graft: Secondary | ICD-10-CM | POA: Diagnosis not present

## 2020-09-16 DIAGNOSIS — I214 Non-ST elevation (NSTEMI) myocardial infarction: Secondary | ICD-10-CM | POA: Diagnosis not present

## 2020-09-16 DIAGNOSIS — G253 Myoclonus: Secondary | ICD-10-CM

## 2020-09-16 DIAGNOSIS — R569 Unspecified convulsions: Secondary | ICD-10-CM | POA: Diagnosis not present

## 2020-09-16 LAB — BPAM RBC
Blood Product Expiration Date: 202208282359
Blood Product Expiration Date: 202208312359
Blood Product Expiration Date: 202208312359
Blood Product Expiration Date: 202209032359
Blood Product Expiration Date: 202209032359
Blood Product Expiration Date: 202209072359
ISSUE DATE / TIME: 202208040759
ISSUE DATE / TIME: 202208040759
ISSUE DATE / TIME: 202208040759
ISSUE DATE / TIME: 202208040759
ISSUE DATE / TIME: 202208041442
Unit Type and Rh: 6200
Unit Type and Rh: 6200
Unit Type and Rh: 6200
Unit Type and Rh: 8400
Unit Type and Rh: 8400
Unit Type and Rh: 8400

## 2020-09-16 LAB — TYPE AND SCREEN
ABO/RH(D): AB POS
Antibody Screen: NEGATIVE
Unit division: 0
Unit division: 0
Unit division: 0
Unit division: 0
Unit division: 0
Unit division: 0

## 2020-09-16 LAB — CBC
HCT: 31.3 % — ABNORMAL LOW (ref 36.0–46.0)
Hemoglobin: 10.4 g/dL — ABNORMAL LOW (ref 12.0–15.0)
MCH: 29.9 pg (ref 26.0–34.0)
MCHC: 33.2 g/dL (ref 30.0–36.0)
MCV: 89.9 fL (ref 80.0–100.0)
Platelets: 113 10*3/uL — ABNORMAL LOW (ref 150–400)
RBC: 3.48 MIL/uL — ABNORMAL LOW (ref 3.87–5.11)
RDW: 24.2 % — ABNORMAL HIGH (ref 11.5–15.5)
WBC: 8.1 10*3/uL (ref 4.0–10.5)
nRBC: 0 % (ref 0.0–0.2)

## 2020-09-16 LAB — BASIC METABOLIC PANEL
Anion gap: 9 (ref 5–15)
BUN: 21 mg/dL — ABNORMAL HIGH (ref 6–20)
CO2: 27 mmol/L (ref 22–32)
Calcium: 9.3 mg/dL (ref 8.9–10.3)
Chloride: 97 mmol/L — ABNORMAL LOW (ref 98–111)
Creatinine, Ser: 4.28 mg/dL — ABNORMAL HIGH (ref 0.44–1.00)
GFR, Estimated: 11 mL/min — ABNORMAL LOW (ref >60)
Glucose, Bld: 91 mg/dL (ref 70–99)
Potassium: 4.2 mmol/L (ref 3.5–5.1)
Sodium: 133 mmol/L — ABNORMAL LOW (ref 135–145)

## 2020-09-16 LAB — GLUCOSE, CAPILLARY
Glucose-Capillary: 115 mg/dL — ABNORMAL HIGH (ref 70–99)
Glucose-Capillary: 139 mg/dL — ABNORMAL HIGH (ref 70–99)
Glucose-Capillary: 185 mg/dL — ABNORMAL HIGH (ref 70–99)
Glucose-Capillary: 58 mg/dL — ABNORMAL LOW (ref 70–99)
Glucose-Capillary: 68 mg/dL — ABNORMAL LOW (ref 70–99)
Glucose-Capillary: 71 mg/dL (ref 70–99)
Glucose-Capillary: 71 mg/dL (ref 70–99)
Glucose-Capillary: 76 mg/dL (ref 70–99)
Glucose-Capillary: 88 mg/dL (ref 70–99)
Glucose-Capillary: 90 mg/dL (ref 70–99)
Glucose-Capillary: 91 mg/dL (ref 70–99)
Glucose-Capillary: 94 mg/dL (ref 70–99)

## 2020-09-16 MED ORDER — ENOXAPARIN SODIUM 30 MG/0.3ML IJ SOSY
30.0000 mg | PREFILLED_SYRINGE | INTRAMUSCULAR | Status: DC
  Administered 2020-09-17 – 2020-09-19 (×3): 30 mg via SUBCUTANEOUS
  Filled 2020-09-16 (×4): qty 0.3

## 2020-09-16 MED ORDER — FOOD THICKENER (SIMPLYTHICK): 5.0000 | ORAL | Status: DC | PRN

## 2020-09-16 MED ORDER — LEVETIRACETAM IN NACL 1000 MG/100ML IV SOLN
1000.0000 mg | Freq: Two times a day (BID) | INTRAVENOUS | Status: DC
  Administered 2020-09-16: 1000 mg via INTRAVENOUS
  Filled 2020-09-16: qty 100

## 2020-09-16 MED ORDER — ENOXAPARIN SODIUM 40 MG/0.4ML IJ SOSY
40.0000 mg | PREFILLED_SYRINGE | INTRAMUSCULAR | Status: DC
  Administered 2020-09-16: 40 mg via SUBCUTANEOUS
  Filled 2020-09-16: qty 0.4

## 2020-09-16 MED ORDER — DEXTROSE 50 % IV SOLN
25.0000 mL | Freq: Once | INTRAVENOUS | Status: AC
  Administered 2020-09-16: 25 mL via INTRAVENOUS
  Filled 2020-09-16: qty 50

## 2020-09-16 MED ORDER — DEXTROSE-NACL 5-0.9 % IV SOLN: INTRAVENOUS | Status: DC

## 2020-09-16 NOTE — Progress Notes (Addendum)
NEUROLOGY CONSULTATION PROGRESS NOTE   Date of service: September 16, 2020 Patient Name: Jodi Bell MRN:  [id number] DOB:  06-24-1960  Brief HPI  Jodi Bell is a 60 y.o. 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 She sat up and then began having spaspm of her legs. She then had right arm flexion and head turn to the right and became unresponsive. She had seizure-like activity lasting approximately 1 minute. Following this, she remained confused and there was question of left sided weakness and therefore code stroke was activated.  Concern for polymyoclonus due to kidney function vs status. cEEG for 24 hours with no seizures ort epileptiform discharges.   Interval Hx   Daughter at bedside, reports that patient improved significantly after her dialysis. No prior seizures, no hx of CNS infection, no CNS surgery, no EtOH use, no famiyl hx of epilepsy, no strokes, no ICH.  Vitals   Vitals:   09/16/20 0753 09/16/20 0800 09/16/20 1100 09/16/20 1208  BP: (!) 146/64 140/63  (!) 143/71  Pulse: 65 66  68  Resp: 16 13    Temp:   98.4 F (36.9 C)   TempSrc:      SpO2: 100% 100%    Weight:      Height:         Body mass index is 26.9 kg/m.  Physical Exam   General: Laying comfortably in bed; in no acute distress.  HENT: Normal oropharynx and mucosa. Normal external appearance of ears and nose.  Neck: Supple, no pain or tenderness  CV: No JVD. No peripheral edema.  Pulmonary: Symmetric Chest rise. Normal respiratory effort.  Abdomen: Soft to touch, non-tender.  Ext: No cyanosis, edema, or deformity  Skin: No rash. Normal palpation of skin.   Musculoskeletal: Normal digits and nails by inspection. No clubbing.   Neurologic Examination  Mental status/Cognition: somnolent but opens eyes to voice and keeps her eyes open. Bradyphrenic. Oriented to self, place, month and year, good attention.  Speech/language: mildly dysarthric, non fluent,  comprehension intact to 1 and most 2 step commands. object naming intact, repetition intact.  Cranial nerves:   CN II Pupils equal and reactive to light, no VF deficits   CN III,IV,VI EOM intact, no gaze preference or deviation, no nystagmus   CN V normal sensation in V1, V2, and V3 segments bilaterally   CN VII no asymmetry, no nasolabial fold flattening   CN VIII normal hearing to speech   CN IX & X normal palatal elevation, no uvular deviation   CN XI 5/5 head turn and 5/5 shoulder shrug bilaterally   CN XII midline tongue protrusion   Motor:  Muscle bulk: poor, tone nromal Somnolence precludes detailed assessment of motor strength but 4/5 bilaterally on hand grips and can keep both legs off the bed for more than 10 secs.  Sensation:  Light touch Intact throughout   Pin prick    Temperature    Vibration   Proprioception    Coordination/Complex Motor:  Intact throughout.  Labs   Basic Metabolic Panel:  Lab Results  Component Value Date   NA 133 (L) 09/16/2020   K 4.2 09/16/2020   CO2 27 09/16/2020   GLUCOSE 91 09/16/2020   BUN 21 (H) 09/16/2020   CREATININE 4.28 (H) 09/16/2020   CALCIUM 9.3 09/16/2020   GFRNONAA 11 (L) 09/16/2020   GFRAA 16 (L) 11/09/2019   HbA1c:  Lab Results  Component Value Date  HGBA1C 5.2 09/13/2020   LDL:  Lab Results  Component Value Date   LDLCALC 112 (H) 09/10/2020   Urine Drug Screen:     Component Value Date/Time   LABOPIA NONE DETECTED 05/16/2018 1158   COCAINSCRNUR NONE DETECTED 05/16/2018 1158   LABBENZ NONE DETECTED 05/16/2018 1158   AMPHETMU NONE DETECTED 05/16/2018 1158   THCU NONE DETECTED 05/16/2018 1158   LABBARB NONE DETECTED 05/16/2018 1158    Alcohol Level     Component Value Date/Time   ETH <5 08/17/2015 1644   No results found for: PHENYTOIN, ZONISAMIDE, LAMOTRIGINE, LEVETIRACETA No results found for: PHENYTOIN, PHENOBARB, VALPROATE, CBMZ  Imaging and Diagnostic studies   CTH w/o contrast: Personally  reviewed and CTH was negative for a large hypodensity concerning for a large territory infarct or hyperdensity concerning for an ICH.  cEEG: This study is suggestive of moderate diffuse encephalopathy, nonspecific etiology. No seizures or epileptiform discharges were seen throughout the recording.  Impression   Jodi Bell is a 60 y.o. female with recent CABG who had a ?brief seizure vs polymyoclonus on 09/15/20 and AMS some ?concern for seizures on limited [place] EEG but 21 lead cEEG with no seizures, no epileptiform discharges for 24 hours. 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  IT would be very unlikely for a 60 y/o to have a first time seizure. Her CTH is normal with no obvious cortical strokes. She and family do not endorse any seizure risk factors.  Recommendations  - I will discontinue Keppra - I do not think that an MRI Brain at this time will be helpful. Certainly if she worsens or has concern for repeat seizure like activity or overall is not improving, we can reconsider getting an MRI Brain. - discontinue cEEG. - Not much else for [place] to add at this time. We will signoff. Please feel free to contact us with any questions or concerns.  ______________________________________________________________________   Thank you for the opportunity to take part in the care of this patient. If you have any further questions, please contact the neurology consultation attending.  Signed,  [place] Pager Number [phone]

## 2020-09-16 NOTE — Consult Note (Signed)
NAME:  Jodi Bell, MRN:  [id number], DOB:  24-Oct-1960, LOS: 8 ADMISSION DATE:  09/08/2020, CONSULTATION DATE: 09/15/2020 REFERRING MD: Dr. Kipp Brood, CHIEF COMPLAINT: Possible seizure  History of Present Illness:  This is a 60 year old female, past medical history of ESRD on dialysis, CAD, COPD, type 2 diabetes.  Patient is here in the hospital status post CABG.  Last night she was doing well until around 5 AM.  She started having spasming in her legs right arm flexion head turn to the right became unresponsive.  Felt to have seizure-like activity lasting approximately 1 minute.  Post seizure had left-sided weakness and a code stroke was called.  Patient was seen by neurology and sent down for a CT scan of the head which was negative.  Also had a spot EEG.  Now plan for long-term video EEG.  Critical care was consulted for recommendations and management.  She did receive Ativan and was more somnolent after this event.  Pertinent  Medical History   Past Medical History:  Diagnosis Date   Anemia of chronic disease    Anxiety    Arthritis    oa and psoriatic ra   Asthma    Back pain, chronic    lower back   Candida infection finished tx 2 days ago   Chronic insomnia    COPD (chronic obstructive pulmonary disease) (HCC)    Coronary artery calcification seen on CT scan    Diabetes mellitus    type 2   Eczema    ESRD on dialysis [facility])    Essential hypertension    GERD (gastroesophageal reflux disease)    Gout    History of hiatal hernia    Hypoalbuminemia    Hyponatremia    Mild aortic stenosis    Mitral regurgitation    Mitral stenosis    Obesity    PAD (peripheral artery disease) ([place])    a. externial iliac calcification seen on CT 04/2020.   Pneumonia few yrs ago x 2   Sleep apnea    Tobacco abuse    Upper GI bleed 05/2019     Significant Hospital Events: Including procedures, antibiotic start and stop dates in addition to other pertinent events   8/6 Early morning,  seizure-like activity.  Interim History / Subjective:   More awake this morning on BiPAP.  Objective   Blood pressure 140/63, pulse 66, temperature 98.2 F (36.8 C), temperature source Oral, resp. rate 13, height '5\' 2"'$  (1.575 m), weight 66.7 kg, SpO2 100 %.    FiO2 (%):  [30 %] 30 %   Intake/Output Summary (Last 24 hours) at 09/16/2020 0925 Last data filed at 09/16/2020 0800 Gross per 24 hour  Intake 879.18 ml  Output 2500 ml  Net -1620.82 ml   Filed Weights   09/15/20 0901 09/15/20 1201 09/16/20 0500  Weight: 67.5 kg 65 kg 66.7 kg    Examination: General: Chronically ill-appearing female resting in bed on BiPAP HENT: NCAT Lungs: Bilateral ventilated breaths on BiPAP Cardiovascular: Regular rhythm S1-S2 Abdomen: Soft, nontender nondistended Extremities: No significant edema Neuro: Alert oriented following commands no focal deficit GU: Deferred  Resolved Hospital Problem list     Assessment & Plan:   Acute metabolic encephalopathy Possible CVA, possible seizure Plan: Long-term video EEG continued Continue Keppra Continue phenytoin Brain MRI pending We appreciate neurology input  Acute hypoxemic respiratory failure on nasal cannula Plan Wean FiO2 as tolerated to maintain SPO2 greater than 90 BiPAP as needed  NSTEMI Coronary artery  disease status post CABG Plan: Postop care per thoracic surgery  End-stage renal disease on IHD Plan: Continue HD as scheduled by nephrology  Best Practice (right click and "Reselect all SmartList Selections" daily)   Diet/type: NPO DVT prophylaxis: SCD GI prophylaxis: PPI Lines: yes and it is still needed Foley:  removal ordered  Code Status:  full code Last date of multidisciplinary goals of care discussion [per primary]  Labs   CBC: Recent Labs  Lab 09/13/20 1940 09/13/20 2009 09/13/20 2152 09/14/20 0500 09/14/20 1710 09/15/20 0440 09/16/20 0405  WBC 6.9  --   --  9.0 9.8 10.0 8.1  HGB 11.0*   < > 11.9* 11.2*  11.2* 10.6* 10.4*  HCT 32.7*   < > 35.0* 34.6* 34.1* 32.4* 31.3*  MCV 88.6  --   --  90.3 89.5 90.5 89.9  PLT 119*  --   --  128* 114* 107* 113*   < > = values in this interval not displayed.    Basic Metabolic Panel: Recent Labs  Lab 09/12/20 1946 09/13/20 0333 09/13/20 1940 09/13/20 2009 09/13/20 2152 09/14/20 0500 09/14/20 1710 09/15/20 0440 09/16/20 0405  NA 132*   < > 134*   < > 134* 132* 132* 130* 133*  K 3.4*   < > 3.7   < > 3.8 3.7 4.8 5.0 4.2  CL 97*   < > 100  --   --  98 98 96* 97*  CO2 26   < > 25  --   --  '25 24 24 27  '$ GLUCOSE 153*   < > 84  --   --  102* 76 88 91  BUN 7   < > 13  --   --  15 20 25* 21*  CREATININE 2.57*   < > 3.42*  --   --  3.92* 4.72* 5.26* 4.28*  CALCIUM 9.3   < > 8.8*  --   --  9.1 9.8 9.7 9.3  MG  --   --   --   --   --  1.8 2.0  --   --   PHOS 1.5*  --  3.2  --   --   --   --   --   --    < > = values in this interval not displayed.   GFR: Estimated Creatinine Clearance: 12.5 mL/min (A) (by C-G formula based on SCr of 4.28 mg/dL (H)). Recent Labs  Lab 09/14/20 0500 09/14/20 1710 09/15/20 0440 09/16/20 0405  WBC 9.0 9.8 10.0 8.1    Liver Function Tests: Recent Labs  Lab 09/12/20 1946 09/13/20 0333 09/13/20 1940  AST  --  13* 16  ALT  --  9 9  ALKPHOS  --  68 45  BILITOT  --  0.4 0.5  PROT  --  5.0* 4.0*  ALBUMIN 2.5* 2.2* 2.2*   No results for input(s): LIPASE, AMYLASE in the last 168 hours. No results for input(s): AMMONIA in the last 168 hours.  ABG    Component Value Date/Time   PHART 7.311 (L) 09/13/2020 2152   PCO2ART 50.6 (H) 09/13/2020 2152   PO2ART 170 (H) 09/13/2020 2152   HCO3 25.5 09/13/2020 2152   TCO2 27 09/13/2020 2152   ACIDBASEDEF 1.0 09/13/2020 2152   O2SAT 99.0 09/13/2020 2152     Coagulation Profile: Recent Labs  Lab 09/12/20 2021 09/13/20 1247 09/13/20 2000  INR 1.1 1.7* 1.3*    Cardiac Enzymes: No results for input(s): CKTOTAL,  CKMB, CKMBINDEX, TROPONINI in the last 168  hours.  HbA1C: Hgb A1c MFr Bld  Date/Time Value Ref Range Status  09/13/2020 03:33 AM 5.2 4.8 - 5.6 % Final    Comment:    (NOTE) Pre diabetes:          5.7%-6.4%  Diabetes:              >6.4%  Glycemic control for   <7.0% adults with diabetes   09/09/2020 03:00 AM 5.2 4.8 - 5.6 % Final    Comment:    (NOTE) Pre diabetes:          5.7%-6.4%  Diabetes:              >6.4%  Glycemic control for   <7.0% adults with diabetes     CBG: Recent Labs  Lab 09/15/20 2037 09/15/20 2356 09/16/20 0059 09/16/20 0401 09/16/20 0737  GLUCAP 136* 58* 139* 94 76    Review of Systems:   Unable to obtain due to critical illness   Past Medical History:  She,  has a past medical history of Anemia of chronic disease, Anxiety, Arthritis, Asthma, Back pain, chronic, Candida infection (finished tx 2 days ago), Chronic insomnia, COPD (chronic obstructive pulmonary disease) ([place]), Coronary artery calcification seen on CT scan, Diabetes mellitus, Eczema, ESRD on dialysis ([place]), Essential hypertension, GERD (gastroesophageal reflux disease), Gout, History of hiatal hernia, Hypoalbuminemia, Hyponatremia, Mild aortic stenosis, Mitral regurgitation, Mitral stenosis, Obesity, PAD (peripheral artery disease) ([place]), Pneumonia (few yrs ago x 2), Sleep apnea, Tobacco abuse, and Upper GI bleed (05/2019).   Surgical History:   Past Surgical History:  Procedure Laterality Date   BACK SURGERY  2016   lower back fusion with cage   BIOPSY  09/23/2017   Procedure: BIOPSY;  Surgeon: Wonda Horner, MD;  Location: [facility];  Service: Endoscopy;;   BIOPSY  05/19/2019   Procedure: BIOPSY;  Surgeon: Otis Brace, MD;  Location: [place];  Service: Gastroenterology;;   CESAREAN SECTION  1990   x 1    COLONOSCOPY WITH PROPOFOL N/A 09/23/2017   Procedure: COLONOSCOPY WITH PROPOFOL Hemostatic clips placed;  Surgeon: Wonda Horner, MD;  Location: [facility];  Service: Endoscopy;  Laterality: N/A;    CORONARY ARTERY BYPASS GRAFT N/A 09/13/2020   Procedure: CORONARY ARTERY BYPASS GRAFTING (CABG), ON PUMP, TIMES TWO, USING LEFT INTERNAL MAMMARY ARTERY AND RIGHT ENDOSCOPICALLY HARVESTED GREATER SAPHENOUS VEIN;  Surgeon: Gaye Pollack, MD;  Location: [place];  Service: Open Heart Surgery;  Laterality: N/A;   DILATION AND CURETTAGE OF UTERUS  1988   ESOPHAGOGASTRODUODENOSCOPY (EGD) WITH PROPOFOL N/A 09/23/2017   Procedure: ESOPHAGOGASTRODUODENOSCOPY (EGD) WITH PROPOFOL;  Surgeon: Wonda Horner, MD;  [facility];  Service: Endoscopy;  Laterality: N/A;   ESOPHAGOGASTRODUODENOSCOPY (EGD) WITH PROPOFOL N/A 05/19/2019   Procedure: ESOPHAGOGASTRODUODENOSCOPY (EGD) WITH PROPOFOL;  Surgeon: Otis Brace, MD;  Location: [place];  Service: Gastroenterology;  Laterality: N/A;   GIVENS CAPSULE STUDY N/A 05/19/2019   Procedure: GIVENS CAPSULE STUDY;  Surgeon: Otis Brace, MD;  Location: [place];  Service: Gastroenterology;  Laterality: N/A;   HOT HEMOSTASIS N/A 05/19/2019   Procedure: HOT HEMOSTASIS (ARGON PLASMA COAGULATION/BICAP);  Surgeon: Otis Brace, MD;  Location: [facility] END[facility]COPY;  Service: Gastroenterology;  Laterality: N/A;   LEFT HEART CATH AND CORONARY ANGIOGRAPHY N/A 09/10/2020   Procedure: LEFT HEART CATH AND CORONARY ANGIOGRAPHY;  Surgeon: Nelva Bush, MD;  Location: [place] [facility];  Service: Cardiovascular;  Laterality: N/A;   PARTIAL KNEE ARTHROPLASTY  Left 11/12/2017   Procedure: left unicompartmental arthroplasty-medial;  Surgeon: Paralee Cancel, MD;  Location: [facility];  Service: Orthopedics;  Laterality: Left;  64mn   SUBMUCOSAL INJECTION  09/23/2017   Procedure: SUBMUCOSAL INJECTION;  Surgeon: GWonda Horner MD;  [facility];  Service: Endoscopy;;  in colon   TEE WITHOUT CARDIOVERSION N/A 09/13/2020   Procedure: TRANSESOPHAGEAL ECHOCARDIOGRAM (TEE);  Surgeon: BGaye Pollack MD;  [place]  Service: Open Heart Surgery;  Laterality: N/A;   TUBAL  LIGATION  1990     Social History:   reports that she has been smoking cigarettes. She has a 20.00 pack-year smoking history. She has never used smokeless tobacco. She reports current alcohol use. She reports that she does not use drugs.   Family History:  Her family history includes Arthritis in her daughter and daughter; Asthma in her daughter; Diabetes in her sister; Heart disease in her sister and another family member; Lung cancer in her mother; Thyroid cancer in an other family member.   Allergies Allergies  Allergen Reactions   3-Methyl-2-Benzothiazolinone Hydrazone Other (See Comments)    Muscle weakness muscle cramping in legs Other reaction(s): Myalgias (Muscle Pain)   Banana Anaphylaxis and Swelling    TONGUE AND MOUTH SWELLING   Black Walnut Flavor Anaphylaxis    Walnuts   Hazelnut ([place]) Allergy Skin Test Anaphylaxis    Hazelnuts   Leflunomide And Related Other (See Comments)    Severe Headache   Lisinopril Swelling    Angioedema  Swelling of the face   No [employer] Food Allergies Anaphylaxis    Spicy Mustard 4.7.2021 Pt reports that she eats Regular Yellow Mustard   Other Other (See Comments), Anaphylaxis and Swelling    Cosentyx= angioedema  Other reaction(s): Facial Edema (intolerance) Mouth itches and tongue swelling Hazel nuts and pecans also Walnuts Walnuts Renal failure Other reaction(s): Other Serotonin Syndrome  Serotonin syndrome  Tremors Other reaction(s): Other Tremors Muscle weakness muscle cramping in legs Other reaction(s): Myalgias (Muscle Pain) Mouth itches and tongue swelling Hazel nuts and pecans also Walnuts    Pecan Extract Allergy Skin Test Anaphylaxis    Pecans    Pecan Nut (Diagnostic) Anaphylaxis    Pecans   Trazodone And Nefazodone Other (See Comments)    Serotonin Syndrome  Tremors   Adalimumab Other (See Comments)    Blisters on abdomen =humira   Escitalopram Oxalate Other (See Comments)    Hand problems -  Serotonin Syndrome     Ezetimibe Other (See Comments)    myalgias cramps    Secukinumab Swelling    Cosentyx = angiodema    Statins Other (See Comments)    Leg pains and weakness   Ibuprofen Other (See Comments)    Renal Problems     Home Medications  Prior to Admission medications   Medication Sig Start Date End Date Taking? Authorizing Provider  acetaminophen (TYLENOL) 500 MG tablet Take 1,000 mg by mouth every 6 (six) hours as needed for mild pain.   Yes [provider]  albuterol (PROVENTIL) (2.5 MG/3ML) 0.083% nebulizer solution Take 2.5 mg by nebulization as needed for wheezing or shortness of breath. 04/15/19  Yes [provider]  allopurinol (ZYLOPRIM) 100 MG tablet Take 100 mg by mouth every evening.   Yes [provider]  amLODipine (NORVASC) 10 MG tablet Take 10 mg by mouth daily.   Yes [provider]  calcium acetate (PHOSLO) 667 MG capsule Take 3 capsules by mouth in the morning and  at bedtime. Take 2 capsules with snacks. 10/25/18  Yes [provider]  carboxymethylcellulose 1 % ophthalmic solution Place 1 drop into both eyes 4 (four) times daily.   Yes [provider]  cetirizine (ZYRTEC) 10 MG tablet Take 10 mg by mouth 2 (two) times daily as needed (hives).  06/13/17  Yes Shelda Pal, DO  Cholecalciferol (VITAMIN D3) 10 MCG (400 UNIT) tablet Take 400 Units by mouth every evening.   Yes [provider]  cloNIDine (CATAPRES) 0.3 MG tablet Take 1 tablet (0.3 mg total) by mouth 3 (three) times daily. 05/21/19  Yes Hosie Poisson, MD  cyclobenzaprine (FLEXERIL) 10 MG tablet Take 10 mg by mouth 3 (three) times daily as needed for muscle spasms. 06/08/20  Yes [provider]  ELDERBERRY PO Take 2 drops by mouth daily.   Yes [provider]  ferrous sulfate 325 (65 FE) MG tablet Take 325 mg by mouth 3 (three) times a week. TUE THUR SAT   Yes [provider]  fluticasone (FLONASE) 50  MCG/ACT nasal spray Place 1 spray into both nostrils daily.   Yes [provider]  folic acid (FOLVITE) [id number] MCG tablet Take 400 mcg by mouth daily.    Yes [provider]  furosemide (LASIX) 40 MG tablet Take 40 mg by mouth 2 (two) times daily.    Yes [provider]  gabapentin (NEURONTIN) 300 MG capsule Take 1 capsule (300 mg total) by mouth at bedtime as needed (Neuropathic pain). Patient taking differently: Take 300 mg by mouth as needed (pain). 12/14/17 10/20/20 Yes Dana Allan I, MD  hydrALAZINE (APRESOLINE) 100 MG tablet Take 1 tablet (100 mg total) by mouth every 8 (eight) hours. 07/06/20  Yes [name], MD  isosorbide mononitrate (IMDUR) 60 MG 24 hr tablet Take 1 tablet (60 mg total) by mouth daily. 03/02/18  Yes Thurnell Lose, MD  labetalol (NORMODYNE) 200 MG tablet Take 400 mg by mouth 3 (three) times daily.   Yes [provider]  LORazepam (ATIVAN) 1 MG tablet Take 1 mg by mouth every 8 (eight) hours as needed for anxiety. 06/25/20  Yes [provider]  montelukast (SINGULAIR) 10 MG tablet Take 10 mg by mouth daily with supper.    Yes [provider]  multivitamin (RENA-VIT) TABS tablet Take 1 tablet by mouth daily with supper.   Yes [provider]  nitroGLYCERIN (NITROSTAT) 0.4 MG SL tablet Place 0.4 mg under the tongue every 5 (five) minutes as needed for chest pain. 08/01/20  Yes [provider]  olmesartan (BENICAR) 40 MG tablet Take 40 mg by mouth every evening. 05/06/19  Yes [provider]  oxyCODONE (ROXICODONE) 15 MG immediate release tablet Take 15 mg by mouth 3 (three) times daily as needed for pain. 05/31/20  Yes [provider]  pantoprazole (PROTONIX) 40 MG tablet Take 40 mg by mouth daily. 06/09/20  Yes [provider]  predniSONE (DELTASONE) 5 MG tablet Take 1 tablet (5 mg total) by mouth daily with breakfast. 07/06/20  Yes [name], MD  Probiotic Product  (PROBIOTIC PO) Take 1 capsule by mouth daily.   Yes [provider]  Tiotropium Bromide Monohydrate (SPIRIVA RESPIMAT IN) Inhale 2 puffs into the lungs daily.   Yes [provider]  VENTOLIN HFA 108 (90 Base) MCG/ACT inhaler Inhale 1-2 puffs into the lungs every 6 (six) hours as needed for shortness of breath. wheezing 06/13/15  Yes [provider]  XELJANZ 5 MG TABS  Take 5 mg by mouth at bedtime. 05/17/19  Yes [provider]  zolpidem (AMBIEN) 5 MG tablet Take 5 mg by mouth at bedtime. 05/05/19  Yes [provider]  cinacalcet (SENSIPAR) 30 MG tablet Take 30 mg by mouth 3 (three) times a week. Tuesday, Thursday, Saturday. Patient not taking: Reported on 09/09/2020    [provider]  EPINEPHrine (EPIPEN) 0.3 mg/0.3 mL SOAJ injection Inject 0.3 mLs (0.3 mg total) into the muscle once. Patient taking differently: Inject 0.3 mg into the muscle as needed (for allergic reaction). 09/30/12   Le, Thao P, DO  JANUVIA 25 MG tablet Take 1 tablet (25 mg total) by mouth daily. Patient not taking: Reported on 09/09/2020 07/06/20   [name], MD    This patient is critically ill with multiple organ system failure; which, requires frequent high complexity decision making, assessment, support, evaluation, and titration of therapies. This was completed through the application of advanced monitoring technologies and extensive interpretation of multiple databases. During this encounter critical care time was devoted to patient care services described in this note for 32 minutes.    Garner Nash, DO [facility] 09/16/2020 9:25 AM

## 2020-09-16 NOTE — Procedures (Signed)
Patient Name: Jodi Bell  MRN: [id number]  Epilepsy Attending: Lora Havens  Referring Physician/Provider: : Dr Linnell Fulling Duration: 09/15/2020 [id number] 09/15/2020 0755   Patient history: 61 yo F with recent CABG with new onset seizure and AMS. EEG to evaluate for seizure   Level of alertness: lethargic    AEDs during EEG study: LEV  Technical aspects: This EEG study was done with scalp electrodes positioned according to the 10-20 International system of electrode placement. Electrical activity was acquired at a sampling rate of '500Hz'$  and reviewed with a high frequency filter of '70Hz'$  and a low frequency filter of '1Hz'$ . EEG data were recorded continuously and digitally stored.   Description: No  posterior dominant rhythm was seen.  EEG showed continuous generalized 5 to 6 Hz theta as well as intermittent generalized 2-'3Hz'$  delta slowing. Hyperventilation and photic stimulation were not performed.     ABNORMALITY - Continuous slow, generalized  IMPRESSION: This study is suggestive of moderate diffuse encephalopathy, nonspecific etiology. No seizures or epileptiform discharges were seen throughout the recording.  [name] [name]

## 2020-09-16 NOTE — Progress Notes (Addendum)
Attempted to get patient OOB to chair. Patient had spasms in legs and was unable to stand for any length of time.  When attempted to try and stand with [facility], patient was unable to grasp a hold of the stedy with hands.  Hands, especially the left one kept falling off of the stedy as soon as she would try to hold it.  Patient appears weaker on the left when holding stedy than the right.  Grips remain equal and tongue remains midline and foot pushes remain equal as well.  Will continue to monitor.

## 2020-09-16 NOTE — Evaluation (Signed)
Clinical/Bedside Swallow Evaluation Patient Details  Name: Jodi Bell MRN: [id number] Date of Birth: [birth year]  Today's Date: 09/16/2020 Time: SLP Start Time (ACUTE ONLY): 1159 SLP Stop Time (ACUTE ONLY): 1229 SLP Time Calculation (min) (ACUTE ONLY): 30 min  Past Medical History:  Past Medical History:  Diagnosis Date   Anemia of chronic disease    Anxiety    Arthritis    oa and psoriatic ra   Asthma    Back pain, chronic    lower back   Candida infection finished tx 2 days ago   Chronic insomnia    COPD (chronic obstructive pulmonary disease) (HCC)    Coronary artery calcification seen on CT scan    Diabetes mellitus    type 2   Eczema    ESRD on dialysis [facility])    Essential hypertension    GERD (gastroesophageal reflux disease)    Gout    History of hiatal hernia    Hypoalbuminemia    Hyponatremia    Mild aortic stenosis    Mitral regurgitation    Mitral stenosis    Obesity    PAD (peripheral artery disease) ([place])    a. externial iliac calcification seen on CT 04/2020.   Pneumonia few yrs ago x 2   Sleep apnea    Tobacco abuse    Upper GI bleed 05/2019   Past Surgical History:  Past Surgical History:  Procedure Laterality Date   BACK SURGERY  2016   lower back fusion with cage   BIOPSY  09/23/2017   Procedure: BIOPSY;  Surgeon: Wonda Horner, MD;  Location: [facility];  Service: Endoscopy;;   BIOPSY  05/19/2019   Procedure: BIOPSY;  Surgeon: Otis Brace, MD;  Location: [place];  Service: Gastroenterology;;   CESAREAN SECTION  1990   x 1    COLONOSCOPY WITH PROPOFOL N/A 09/23/2017   Procedure: COLONOSCOPY WITH PROPOFOL Hemostatic clips placed;  Surgeon: Wonda Horner, MD;  Location: [facility];  Service: Endoscopy;  Laterality: N/A;   CORONARY ARTERY BYPASS GRAFT N/A 09/13/2020   Procedure: CORONARY ARTERY BYPASS GRAFTING (CABG), ON PUMP, TIMES TWO, USING LEFT INTERNAL MAMMARY ARTERY AND RIGHT ENDOSCOPICALLY HARVESTED GREATER SAPHENOUS VEIN;   Surgeon: Gaye Pollack, MD;  Location: [place];  Service: Open Heart Surgery;  Laterality: N/A;   DILATION AND CURETTAGE OF UTERUS  1988   ESOPHAGOGASTRODUODENOSCOPY (EGD) WITH PROPOFOL N/A 09/23/2017   Procedure: ESOPHAGOGASTRODUODENOSCOPY (EGD) WITH PROPOFOL;  Surgeon: Wonda Horner, MD;  Location: [facility];  Service: Endoscopy;  Laterality: N/A;   ESOPHAGOGASTRODUODENOSCOPY (EGD) WITH PROPOFOL N/A 05/19/2019   Procedure: ESOPHAGOGASTRODUODENOSCOPY (EGD) WITH PROPOFOL;  Surgeon: Otis Brace, MD;  Location: [place];  Service: Gastroenterology;  Laterality: N/A;   GIVENS CAPSULE STUDY N/A 05/19/2019   Procedure: GIVENS CAPSULE STUDY;  Surgeon: Otis Brace, MD;  Location: [place];  Service: Gastroenterology;  Laterality: N/A;   HOT HEMOSTASIS N/A 05/19/2019   Procedure: HOT HEMOSTASIS (ARGON PLASMA COAGULATION/BICAP);  Surgeon: Otis Brace, MD;  Location: [facility];  Service: Gastroenterology;  Laterality: N/A;   LEFT HEART CATH AND CORONARY ANGIOGRAPHY N/A 09/10/2020   Procedure: LEFT HEART CATH AND CORONARY ANGIOGRAPHY;  Surgeon: Nelva Bush, MD;  Location: [place] [facility];  Service: Cardiovascular;  Laterality: N/A;   PARTIAL KNEE ARTHROPLASTY Left 11/12/2017   Procedure: left unicompartmental arthroplasty-medial;  Surgeon: Paralee Cancel, MD;  Location: [facility];  Service: Orthopedics;  Laterality: Left;  53mn   SUBMUCOSAL INJECTION  09/23/2017   Procedure: SUBMUCOSAL INJECTION;  Surgeon: Wonda Horner, MD;  Location: [facility];  Service: Endoscopy;;  in colon   TEE WITHOUT CARDIOVERSION N/A 09/13/2020   Procedure: TRANSESOPHAGEAL ECHOCARDIOGRAM (TEE);  Surgeon: Gaye Pollack, MD;  Location: [place];  Service: Open Heart Surgery;  Laterality: N/A;   TUBAL LIGATION  1990   HPI:  60 y.o. female with medical history significant of ESRD on HD MWF, refractory HTN, chronic diastolic CHF, IIDM, diabetic neuropathy, COPD, cigarette smoker, RA on chronic steroid, CAD,  PAD, chronic anemia presented with sudden onset of shortness of breath s/p CABG 8/4. She started having spasming in her legs right arm flexion head turn to the right became unresponsive.  Felt to have seizure-like activity lasting approximately 1 minute.  Post seizure had left-sided weakness and a code stroke was called.  Patient was seen by neurology and sent down for a CT scan of the head which was negative.   Assessment / Plan / Recommendation Clinical Impression  Pt presents with a mild oral and suspected component of pharyngeal dysphagia. Cannot exclude esophageal dysphagia component as well. Pt alert, though remains somewhat confused requiring cueing. Daughter at bedside. Provided oral care, some xerostomia noted. Placed upper dentures. Pt has lower partials, though did not want to use due to not fitting well. Pt exhibited some frequent coughing with thin liquids in isolation including cup and straw sip concerning for reduced airway protection. Nectar thick liquids were without overt s/sx of aspiration. Pt did however exhibit multiple swallows with small boluses; question integrity of swallowing efficiency. SLP present during med administration. Pt with difficulty swallowing larger PO meds. All  meds given with puree. Pt with gag reflex and expectoration of larger PO tablet. She states she "had neck surgery in February and has had swallowing difficulty since". Recommend dysphagia 2 (finely chopped) and nectar (mildly thick) liquids with meds in puree (crush larger as needed). SLP to follow up for diet tolerance and need for instrumental swallow assessment. SLP Visit Diagnosis: Dysphagia, oral phase (R13.11);Dysphagia, unspecified (R13.10)    Aspiration Risk  Mild aspiration risk;Moderate aspiration risk    Diet Recommendation   Dysphagia 2 (finely chopped) , nectar thick liquids  Medication Administration: Whole meds with puree (crush larger as needed)    Other  Recommendations Oral Care  Recommendations: Oral care BID Other Recommendations: Order thickener from pharmacy   Follow up Recommendations Other (comment) (TBD)      Frequency and Duration min 2x/week  2 weeks       Prognosis Prognosis for Safe Diet Advancement: Good Barriers to Reach Goals: Cognitive deficits;Time post onset      Swallow Study   General Date of Onset: 09/08/20 HPI: 60 y.o. female with medical history significant of ESRD on HD MWF, refractory HTN, chronic diastolic CHF, IIDM, diabetic neuropathy, COPD, cigarette smoker, RA on chronic steroid, CAD, PAD, chronic anemia presented with sudden onset of shortness of breath s/p CABG 8/4. She started having spasming in her legs right arm flexion head turn to the right became unresponsive.  Felt to have seizure-like activity lasting approximately 1 minute.  Post seizure had left-sided weakness and a code stroke was called.  Patient was seen by neurology and sent down for a CT scan of the head which was negative. Type of Study: Bedside Swallow Evaluation Previous Swallow Assessment: none on file Diet Prior to this Study: NPO Temperature Spikes Noted: No Respiratory Status: Nasal cannula History of Recent Intubation: No Behavior/Cognition: Requires cueing;Alert;Confused Oral Cavity Assessment: Dry Oral Care Completed  by SLP: Yes Oral Cavity - Dentition: Dentures, top;Missing dentition (has lower partials, though states does not fit well and does not use with eating/drinking) Vision: Functional for self-feeding Self-Feeding Abilities: Needs assist Patient Positioning: Upright in bed Baseline Vocal Quality: Low vocal intensity Volitional Cough: Weak Volitional Swallow: Unable to elicit    Oral/Motor/Sensory Function Overall Oral Motor/Sensory Function: Generalized oral weakness   Ice Chips Ice chips: Impaired Presentation: Spoon Oral Phase Impairments: Reduced lingual movement/coordination Oral Phase Functional Implications: Prolonged oral  transit Pharyngeal Phase Impairments: Suspected delayed Swallow;Multiple swallows   Thin Liquid Thin Liquid: Impaired Presentation: Cup;Straw Oral Phase Functional Implications: Prolonged oral transit Pharyngeal  Phase Impairments: Suspected delayed Swallow;Multiple swallows;Cough - Immediate;Cough - Delayed;Throat Clearing - Delayed    Nectar Thick Nectar Thick Liquid: Impaired Presentation: Cup;Straw Oral phase functional implications: Prolonged oral transit Pharyngeal Phase Impairments: Suspected delayed Swallow;Multiple swallows   Honey Thick Honey Thick Liquid: Not tested   Puree Puree: Impaired Oral Phase Impairments: Reduced lingual movement/coordination Pharyngeal Phase Impairments: Suspected delayed Swallow;Multiple swallows   Solid     Solid: Impaired Presentation: Self Fed Oral Phase Impairments: Reduced lingual movement/coordination;Impaired mastication Oral Phase Functional Implications: Prolonged oral transit;Impaired mastication;Oral residue Pharyngeal Phase Impairments: Suspected delayed Swallow;Multiple swallows      Hayden Rasmussen MA, CCC-SLP Acute Rehabilitation Services   09/16/2020,1:01 PM

## 2020-09-16 NOTE — Progress Notes (Signed)
vLTM EEG complete. No skin breakdown 

## 2020-09-16 NOTE — Progress Notes (Signed)
      [address]       [place], [address]             [phone]                 3 Days Post-Op Procedure(s) (LRB): CORONARY ARTERY BYPASS GRAFTING (CABG), ON PUMP, TIMES TWO, USING LEFT INTERNAL MAMMARY ARTERY AND RIGHT ENDOSCOPICALLY HARVESTED GREATER SAPHENOUS VEIN (N/A) TRANSESOPHAGEAL ECHOCARDIOGRAM (TEE) (N/A)   Events: No events On bipap for low resp rate _______________________________________________________________ Vitals: BP (!) 146/64   Pulse 65   Temp 98.2 F (36.8 C) (Oral)   Resp 16   Ht '5\' 2"'$  (1.575 m)   Wt 66.7 kg   SpO2 100%   BMI 26.90 kg/m   - Neuro: arousable, nonfocal  - Cardiovascular: sinus  Drips: amio 30.      - Pulm: brady.  On bipap Rate 16 FiO2 (%):  [30 %] 30 %  ABG    Component Value Date/Time   PHART 7.311 (L) 09/13/2020 2152   PCO2ART 50.6 (H) 09/13/2020 2152   PO2ART 170 (H) 09/13/2020 2152   HCO3 25.5 09/13/2020 2152   TCO2 27 09/13/2020 2152   ACIDBASEDEF 1.0 09/13/2020 2152   O2SAT 99.0 09/13/2020 2152    - Abd: ND - Extremity: warm  .Intake/Output      08/06 0701 08/07 0700 08/07 0701 08/08 0700   I.V. (mL/kg) 696.1 (10.4)    Other     IV Piggyback 193.4    Total Intake(mL/kg) 889.5 (13.3)    Urine (mL/kg/hr)     Other 2500    Chest Tube     Total Output 2500    Net -1610.5            _______________________________________________________________ Labs: CBC Latest Ref Rng & Units 09/16/2020 09/15/2020 09/14/2020  WBC 4.0 - 10.5 K/uL 8.1 10.0 9.8  Hemoglobin 12.0 - 15.0 g/dL 10.4(L) 10.6(L) 11.2(L)  Hematocrit 36.0 - 46.0 % 31.3(L) 32.4(L) 34.1(L)  Platelets 150 - 400 K/uL 113(L) 107(L) 114(L)   CMP Latest Ref Rng & Units 09/16/2020 09/15/2020 09/14/2020  Glucose 70 - 99 mg/dL 91 88 76  BUN 6 - 20 mg/dL 21(H) 25(H) 20  Creatinine 0.44 - 1.00 mg/dL 4.28(H) 5.26(H) 4.72(H)  Sodium 135 - 145 mmol/L 133(L) 130(L) 132(L)  Potassium 3.5 - 5.1 mmol/L 4.2 5.0 4.8  Chloride 98 - 111 mmol/L 97(L)  96(L) 98  CO2 22 - 32 mmol/L '27 24 24  '$ Calcium 8.9 - 10.3 mg/dL 9.3 9.7 9.8  Total Protein 6.5 - 8.1 g/dL - - -  Total Bilirubin 0.3 - 1.2 mg/dL - - -  Alkaline Phos 38 - 126 U/L - - -  AST 15 - 41 U/L - - -  ALT 0 - 44 U/L - - -    CXR: Pulm vascular congestion  _______________________________________________________________  Assessment and Plan: POD 3 s/p CABG.  ESRD.  Seizure activity on Pod 1-2  Neuro: remains on EEG.  Appreciate neuro.  Possible MRI once off EEG CV: stable.  On amio gtt.  Will convert to PO once she off bipap. Pulm: low threshold for intubation.  Appreciate PCCM Renal: on dialysis today GI: NPO for now.  Will require swallow eval Heme: stable ID: afebrile Endo: SSI Dispo: continue ICU care   [place] 09/16/2020 8:04 AM

## 2020-09-16 NOTE — Procedures (Signed)
Patient Name: Jodi Bell  MRN: [id number]  Epilepsy Attending: Lora Havens  Referring Physician/Provider: : Dr Linnell Fulling Duration: 09/16/2020 0755 09/15/2020 1227   Patient history: 60 yo F with recent CABG with new onset seizure and AMS. EEG to evaluate for seizure   Level of alertness: lethargic    AEDs during EEG study: LEV   Technical aspects: This EEG study was done with scalp electrodes positioned according to the 10-20 International system of electrode placement. Electrical activity was acquired at a sampling rate of '500Hz'$  and reviewed with a high frequency filter of '70Hz'$  and a low frequency filter of '1Hz'$ . EEG data were recorded continuously and digitally stored.   Description: No  posterior dominant rhythm was seen.  EEG showed continuous generalized 5 to 6 Hz theta as well as intermittent generalized 2-'3Hz'$  delta slowing. Hyperventilation and photic stimulation were not performed.      ABNORMALITY - Continuous slow, generalized   IMPRESSION: This study is suggestive of moderate diffuse encephalopathy, nonspecific etiology. No seizures or epileptiform discharges were seen throughout the recording.   Yoshito Gaza [name]

## 2020-09-16 NOTE — Progress Notes (Signed)
Patient ID: Jodi Bell, female   DOB: Sep 10, 1960, 60 y.o.   MRN: [id number]  [facility] Progress Note   Assessment/ Plan:   1.  Non-ST elevation MI status post two-vessel CABG yesterday: Presented with volume overload/acute hypoxic respiratory failure and underwent CABG.  Hemodynamically stable postoperatively and unfortunately suffered seizure versus CVA yesterday morning. 2. ESRD: On hemodialysis on a Monday/Wednesday/Friday schedule as an outpatient and underwent dialysis yesterday after scheduling did not permit dialysis on Friday.  I have ordered for her next hemodialysis again tomorrow at this time she does not have any acute lab abnormality/significant volume excess. 3. Anemia: Slight drop of hemoglobin/hematocrit noted this morning on labs, continue ESA 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 5. Nutrition: Currently n.p.o. with mental status changes/poor ability to protect airway. 6.  Acute neurological changes: With evidence of seizure and likely CVA (imaging pending)-ongoing neurology evaluation.  Plans for MRI brain after pacer wires discontinued.  Subjective:   Underwent dialysis yesterday morning after unable to get it on Friday.  More interactive later in the day after seizure yesterday morning.   Objective:   BP (!) 142/74   Pulse 67   Temp 98.2 F (36.8 C) (Oral)   Resp 12   Ht '5\' 2"'$  (1.575 m)   Wt 66.7 kg   SpO2 98%   BMI 26.90 kg/m   Physical Exam: Gen: Appears comfortable resting on NIPPV CVS: Pulse regular rhythm, distant heart sounds with intact sternotomy dressing Resp: Transmitted breath sounds without distinct rales or rhonchi Abd: Soft, obese, nontender, bowel sounds normal Ext: Trace lower extremity edema, left upper arm AV fistula pulsatile with intact dressings  Labs: BMET Recent Labs  Lab 09/12/20 1946 09/13/20 0333 09/13/20 0807  09/13/20 1025 09/13/20 1044 09/13/20 1124 09/13/20 1251 09/13/20 1940 09/13/20 2009 09/13/20 2105 09/13/20 2152 09/14/20 0500 09/14/20 1710 09/15/20 0440 09/16/20 0405  NA 132* 130*   < > 133*   < > 133*   < > 134* 135 133* 134* 132* 132* 130* 133*  K 3.4* 3.3*   < > 4.3   < > 4.1   < > 3.7 3.7 3.7 3.8 3.7 4.8 5.0 4.2  CL 97* 92*   < > 97*  --  96*  --  100  --   --   --  98 98 96* 97*  CO2 26 29  --   --   --   --   --  25  --   --   --  '25 24 24 27  '$ GLUCOSE 153* 111*   < > 112*  --  143*  --  84  --   --   --  102* 76 88 91  BUN 7 <5*   < > 8  --  9  --  13  --   --   --  15 20 25* 21*  CREATININE 2.57* 3.13*   < > 3.10*  --  3.10*  --  3.42*  --   --   --  3.92* 4.72* 5.26* 4.28*  CALCIUM 9.3 9.8  --   --   --   --   --  8.8*  --   --   --  9.1 9.8 9.7 9.3  PHOS 1.5*  --   --   --   --   --   --  3.2  --   --   --   --   --   --   --    < > =  values in this interval not displayed.   CBC Recent Labs  Lab 09/14/20 0500 09/14/20 1710 09/15/20 0440 09/16/20 0405  WBC 9.0 9.8 10.0 8.1  HGB 11.2* 11.2* 10.6* 10.4*  HCT 34.6* 34.1* 32.4* 31.3*  MCV 90.3 89.5 90.5 89.9  PLT 128* 114* 107* 113*      Medications:     sodium chloride   Intravenous Once   acetaminophen  1,000 mg Oral Q6H   Or   acetaminophen (TYLENOL) oral liquid 160 mg/5 mL  1,000 mg Per Tube Q6H   acetaminophen (TYLENOL) oral liquid 160 mg/5 mL  650 mg Per Tube Once   Or   acetaminophen  650 mg Rectal Once   allopurinol  100 mg Oral QPM   aspirin EC  325 mg Oral Daily   Or   aspirin  324 mg Per Tube Daily   bisacodyl  10 mg Oral Daily   Or   bisacodyl  10 mg Rectal Daily   calcium acetate  2,001 mg Oral TID WC   chlorhexidine  15 mL Mouth Rinse BID   Chlorhexidine Gluconate Cloth  6 each Topical Daily   cholecalciferol  1,000 Units Oral QPM   cinacalcet  30 mg Oral Q T,Th,Sa-HD   darbepoetin (ARANESP) injection - DIALYSIS  100 mcg Intravenous Q Fri-HD   docusate sodium  200 mg Oral Daily    ferrous sulfate  325 mg Oral Once per day on Mon Wed Fri   fluticasone  1 spray Each Nare Daily   folic acid  1 mg Oral Daily   insulin aspart  0-24 Units Subcutaneous Q4H   mouth rinse  15 mL Mouth Rinse q12n4p   metoprolol tartrate  12.5 mg Oral BID   Or   metoprolol tartrate  12.5 mg Per Tube BID   montelukast  10 mg Oral Q supper   nystatin  5 mL Oral QID   pantoprazole  40 mg Oral Daily   polyethylene glycol  17 g Oral Daily   polyvinyl alcohol  1 drop Both Eyes TID AC & HS   predniSONE  5 mg Oral Q breakfast   saccharomyces boulardii  250 mg Oral Daily   sodium chloride flush  3 mL Intravenous Q12H   umeclidinium bromide  1 puff Inhalation Daily   Elmarie Shiley, MD 09/16/2020, 7:41 AM

## 2020-09-17 DIAGNOSIS — J810 Acute pulmonary edema: Secondary | ICD-10-CM | POA: Diagnosis not present

## 2020-09-17 LAB — AMMONIA: Ammonia: 34 umol/L (ref 9–35)

## 2020-09-17 LAB — POCT I-STAT 7, (LYTES, BLD GAS, ICA,H+H)
Acid-Base Excess: 5 mmol/L — ABNORMAL HIGH (ref 0.0–2.0)
Bicarbonate: 29.6 mmol/L — ABNORMAL HIGH (ref 20.0–28.0)
Calcium, Ion: 1.15 mmol/L (ref 1.15–1.40)
HCT: 33 % — ABNORMAL LOW (ref 36.0–46.0)
Hemoglobin: 11.2 g/dL — ABNORMAL LOW (ref 12.0–15.0)
O2 Saturation: 91 %
Patient temperature: 98.6
Potassium: 2.9 mmol/L — ABNORMAL LOW (ref 3.5–5.1)
Sodium: 134 mmol/L — ABNORMAL LOW (ref 135–145)
TCO2: 31 mmol/L (ref 22–32)
pCO2 arterial: 43.7 mmHg (ref 32.0–48.0)
pH, Arterial: 7.439 (ref 7.350–7.450)
pO2, Arterial: 60 mmHg — ABNORMAL LOW (ref 83.0–108.0)

## 2020-09-17 LAB — HEPATITIS B SURFACE ANTIGEN: Hepatitis B Surface Ag: NONREACTIVE

## 2020-09-17 LAB — GLUCOSE, CAPILLARY
Glucose-Capillary: 109 mg/dL — ABNORMAL HIGH (ref 70–99)
Glucose-Capillary: 124 mg/dL — ABNORMAL HIGH (ref 70–99)
Glucose-Capillary: 167 mg/dL — ABNORMAL HIGH (ref 70–99)
Glucose-Capillary: 74 mg/dL (ref 70–99)
Glucose-Capillary: 90 mg/dL (ref 70–99)
Glucose-Capillary: 94 mg/dL (ref 70–99)
Glucose-Capillary: 97 mg/dL (ref 70–99)

## 2020-09-17 LAB — HEPATITIS B CORE ANTIBODY, TOTAL: Hep B Core Total Ab: NONREACTIVE

## 2020-09-17 LAB — RENAL FUNCTION PANEL
Albumin: 1.8 g/dL — ABNORMAL LOW (ref 3.5–5.0)
Anion gap: 11 (ref 5–15)
BUN: 31 mg/dL — ABNORMAL HIGH (ref 6–20)
CO2: 23 mmol/L (ref 22–32)
Calcium: 9.2 mg/dL (ref 8.9–10.3)
Chloride: 97 mmol/L — ABNORMAL LOW (ref 98–111)
Creatinine, Ser: 5.41 mg/dL — ABNORMAL HIGH (ref 0.44–1.00)
GFR, Estimated: 9 mL/min — ABNORMAL LOW (ref >60)
Glucose, Bld: 106 mg/dL — ABNORMAL HIGH (ref 70–99)
Phosphorus: 3.3 mg/dL (ref 2.5–4.6)
Potassium: 4.1 mmol/L (ref 3.5–5.1)
Sodium: 131 mmol/L — ABNORMAL LOW (ref 135–145)

## 2020-09-17 LAB — PHOSPHORUS: Phosphorus: 3.2 mg/dL (ref 2.5–4.6)

## 2020-09-17 LAB — CBC
HCT: 30 % — ABNORMAL LOW (ref 36.0–46.0)
Hemoglobin: 9.8 g/dL — ABNORMAL LOW (ref 12.0–15.0)
MCH: 29.4 pg (ref 26.0–34.0)
MCHC: 32.7 g/dL (ref 30.0–36.0)
MCV: 90.1 fL (ref 80.0–100.0)
Platelets: 114 10*3/uL — ABNORMAL LOW (ref 150–400)
RBC: 3.33 MIL/uL — ABNORMAL LOW (ref 3.87–5.11)
RDW: 23.9 % — ABNORMAL HIGH (ref 11.5–15.5)
WBC: 8.7 10*3/uL (ref 4.0–10.5)
nRBC: 0 % (ref 0.0–0.2)

## 2020-09-17 LAB — HEPATITIS B SURFACE ANTIBODY,QUALITATIVE: Hep B S Ab: REACTIVE — AB

## 2020-09-17 LAB — MAGNESIUM: Magnesium: 1.8 mg/dL (ref 1.7–2.4)

## 2020-09-17 MED ORDER — ALTEPLASE 2 MG IJ SOLR: 2.0000 mg | Freq: Once | INTRAMUSCULAR | Status: DC | PRN

## 2020-09-17 MED ORDER — HEPARIN SODIUM (PORCINE) 1000 UNIT/ML DIALYSIS
1000.0000 [IU] | INTRAMUSCULAR | Status: DC | PRN
  Filled 2020-09-17: qty 1

## 2020-09-17 MED ORDER — HEPARIN SODIUM (PORCINE) 1000 UNIT/ML DIALYSIS
3000.0000 [IU] | INTRAMUSCULAR | Status: DC | PRN
  Filled 2020-09-17: qty 3

## 2020-09-17 MED ORDER — SODIUM CHLORIDE 0.9 % IV SOLN: 100.0000 mL | INTRAVENOUS | Status: DC | PRN

## 2020-09-17 MED ORDER — HEPARIN SODIUM (PORCINE) 1000 UNIT/ML DIALYSIS
2000.0000 [IU] | INTRAMUSCULAR | Status: DC | PRN
  Filled 2020-09-17: qty 2

## 2020-09-17 MED ORDER — LIDOCAINE-PRILOCAINE 2.5-2.5 % EX CREA: 1.0000 "application " | TOPICAL_CREAM | CUTANEOUS | Status: DC | PRN

## 2020-09-17 MED ORDER — PENTAFLUOROPROP-TETRAFLUOROETH EX AERO: 1.0000 "application " | INHALATION_SPRAY | CUTANEOUS | Status: DC | PRN

## 2020-09-17 MED ORDER — LIDOCAINE HCL (PF) 1 % IJ SOLN: 5.0000 mL | INTRAMUSCULAR | Status: DC | PRN

## 2020-09-17 MED ORDER — MAGNESIUM SULFATE 2 GM/50ML IV SOLN
2.0000 g | Freq: Once | INTRAVENOUS | Status: AC
  Administered 2020-09-17: 2 g via INTRAVENOUS
  Filled 2020-09-17: qty 50

## 2020-09-17 MED FILL — Heparin Sodium (Porcine) Inj 1000 Unit/ML: INTRAMUSCULAR | Qty: 30 | Status: AC

## 2020-09-17 MED FILL — Lidocaine HCl Local Preservative Free (PF) Inj 2%: INTRAMUSCULAR | Qty: 15 | Status: AC

## 2020-09-17 MED FILL — Potassium Chloride Inj 2 mEq/ML: INTRAVENOUS | Qty: 40 | Status: AC

## 2020-09-17 NOTE — Progress Notes (Addendum)
Physical Therapy Treatment Patient Details Name: Jodi Bell MRN: [id number] DOB: 1960-09-17 Today's Date: 09/17/2020    History of Present Illness Pt is a 60 y.o. female admitted 09/08/20 with acute onset SOB. Workup for CHF decompensaiton, COPD exacerbation, HTN urgency, NSTEMI; need for emergent HD. S/p LHC 8/1 which showed severe two-vessel CAD. CABGx2 8/4. PMH includes ESRD (HD MWF), HTN, DM2, COPD, OSA, psoriatic RA, PAD, anemia, tobacco abuse.    PT Comments    Pt slower to progress toward goals post CABG.  Pt is experiencing uncontrolled contractions in trunk and extremities and has displayed "foggy" mentation.  Emphasis today on transitions, sitting balance, sit to stands and face to face stand pivot to the chair.   Follow Up Recommendations  Home health PT  based on progress.  Will reassess next session.     Equipment Recommendations  Other (comment) (rollator, TBA)    Recommendations for Other Services       Precautions / Restrictions Precautions Precautions: Fall;Sternal Precaution Booklet Issued: Yes (comment) Precaution Comments: Reviewed sternal precautions and provided handout. Pt demonstrated understanding, but requires reinforcement    Mobility  Bed Mobility Overal bed mobility: Needs Assistance Bed Mobility: Sidelying to Sit   Sidelying to sit: Mod assist;+2 for physical assistance       General bed mobility comments: cued for sequencing, truncal assist rolling R and up via R elbow to sitting.  assist scooting to EOB    Transfers Overall transfer level: Needs assistance   Transfers: Sit to/from Stand;Stand Pivot Transfers Sit to Stand: Mod assist;+2 safety/equipment Stand pivot transfers: Mod assist;+2 safety/equipment       General transfer comment: pt having mild difficulty controlling her legs/sustaing contraction.  Soft buckle during pivotal steps to chair.  Ambulation/Gait             General Gait Details: NT   Stairs              Wheelchair Mobility    Modified Rankin (Stroke Patients Only)       Balance Overall balance assessment: Needs assistance Sitting-balance support: Bilateral upper extremity supported;No upper extremity supported;Feet supported Sitting balance-Leahy Scale: Poor Sitting balance - Comments: pt unable to coordinate sitting EOB.  She experienced uncontrolled extensor contractions paraspinals, quads, etc. and appeared to be unable to sustain the contraction.  Resulting inability to maintain balance at EOB.   Standing balance support: Bilateral upper extremity supported Standing balance-Leahy Scale: Poor Standing balance comment: reliant on external support                            Cognition Arousal/Alertness: Awake/alert Behavior During Therapy: Flat affect Overall Cognitive Status: Impaired/Different from baseline                                 General Comments: Pt appeared to have brain fog during session, but improved when sitting.  Followed one-step commands accurately.      Exercises      General Comments        Pertinent Vitals/Pain Pain Assessment: Faces Faces Pain Scale: Hurts little more Pain Location: sternal Pain Descriptors / Indicators: Discomfort;Guarding Pain Intervention(s): Monitored during session    Home Living                      Prior Function  PT Goals (current goals can now be found in the care plan section) Acute Rehab PT Goals Patient Stated Goal: return home with assist from family PT Goal Formulation: With patient Time For Goal Achievement: 09/25/20 Potential to Achieve Goals: Fair Progress towards PT goals: Progressing toward goals    Frequency    Min 3X/week      PT Plan Current plan remains appropriate    Co-evaluation              AM-PAC PT "6 Clicks" Mobility   Outcome Measure  Help needed turning from your back to your side while in a flat bed without using  bedrails?: A Little Help needed moving from lying on your back to sitting on the side of a flat bed without using bedrails?: A Lot Help needed moving to and from a bed to a chair (including a wheelchair)?: A Lot Help needed standing up from a chair using your arms (e.g., wheelchair or bedside chair)?: A Lot Help needed to walk in hospital room?: A Lot Help needed climbing 3-5 steps with a railing? : A Lot 6 Click Score: 13    End of Session   Activity Tolerance: Patient limited by fatigue;Patient limited by pain;Patient tolerated treatment well Patient left: in chair;with call bell/phone within reach;with chair alarm set Nurse Communication: Mobility status PT Visit Diagnosis: Other abnormalities of gait and mobility (R26.89);Muscle weakness (generalized) (M62.81)     Time: [id number] PT Time Calculation (min) (ACUTE ONLY): 41 min  Charges:  $Therapeutic Activity: 23-37 mins                     09/17/2020  Ginger Carne., PT Acute Rehabilitation Services [phone]  (pager) [phone]  (office)   Jodi Bell 09/17/2020, 2:05 PM

## 2020-09-17 NOTE — Evaluation (Signed)
Occupational Therapy Evaluation Patient Details Name: Jodi Bell MRN: [id number] DOB: [birth year] Today's Date: 09/17/2020    History of Present Illness Pt is a 60 y.o. female admitted 09/08/20 with acute onset SOB. Workup for CHF decompensaiton, COPD exacerbation, HTN urgency, NSTEMI; need for emergent HD. S/p LHC 8/1 which showed severe two-vessel CAD. CABGx2 8/4. PMH includes ESRD (HD MWF), HTN, DM2, COPD, OSA, psoriatic RA, PAD, anemia, tobacco abuse.   Clinical Impression   Pt walked with a cane and was driving prior to admission. She lives with her supportive daughter and her family. Pt presents with myoclonus in trunk and LEs, significant weakness, poor sitting and standing balance and impaired cognition. She requires 2 person assist for all mobility and total assist for ADL. Currently recommending HH, but likely to need a higher level of care.     Follow Up Recommendations  Home health OT;Supervision/Assistance - 24 hour (depending on progress)    Equipment Recommendations       Recommendations for Other Services       Precautions / Restrictions Precautions Precautions: Fall;Sternal Precaution Booklet Issued: Yes (comment) Precaution Comments: reviewed sternal precautions      Mobility Bed Mobility Overal bed mobility: Needs Assistance Bed Mobility: Rolling;Sidelying to Sit Rolling: Max assist;+2 for physical assistance Sidelying to sit: Mod assist;+2 for physical assistance       General bed mobility comments: cued for sequencing, truncal assist rolling R and up via R elbow to sitting.  assist scooting to EOB    Transfers Overall transfer level: Needs assistance   Transfers: Sit to/from Stand;Stand Pivot Transfers Sit to Stand: Mod assist;+2 safety/equipment Stand pivot transfers: Mod assist;+2 safety/equipment       General transfer comment: pt having mild difficulty controlling her legs/sustaing contraction.  Soft buckle during pivotal steps to chair.     Balance Overall balance assessment: Needs assistance Sitting-balance support: Bilateral upper extremity supported;Feet supported Sitting balance-Leahy Scale: Poor Sitting balance - Comments: pt unable to coordinate sitting EOB.  She experienced uncontrolled extensor contractions paraspinals, quads, etc. and appeared to be unable to sustain the contraction.  Resulting inability to maintain balance at EOB. Postural control: Posterior lean Standing balance support: Bilateral upper extremity supported Standing balance-Leahy Scale: Poor Standing balance comment: reliant on external support                           ADL either performed or assessed with clinical judgement   ADL                                         General ADL Comments: total assist     Vision Patient Visual Report: No change from baseline       Perception     Praxis      Pertinent Vitals/Pain Pain Assessment: Faces Faces Pain Scale: Hurts little more Pain Location: sternal Pain Descriptors / Indicators: Discomfort;Guarding Pain Intervention(s): Monitored during session;Repositioned     Hand Dominance Right   Extremity/Trunk Assessment Upper Extremity Assessment Upper Extremity Assessment: Generalized weakness (can bring L hand to mouth, minimal movement in R UE with moderate edema in hand)   Lower Extremity Assessment Lower Extremity Assessment: Defer to PT evaluation   Cervical / Trunk Assessment Cervical / Trunk Assessment: Other exceptions Cervical / Trunk Exceptions: weakness, myoclonus when sitting at EOB   Communication Communication Communication: No  difficulties   Cognition Arousal/Alertness: Awake/alert Behavior During Therapy: Flat affect Overall Cognitive Status: Impaired/Different from baseline Area of Impairment: Attention;Following commands;Memory;Safety/judgement;Awareness;Problem solving                   Current Attention Level:  Sustained Memory: Decreased short-term memory;Decreased recall of precautions Following Commands: Follows one step commands with increased time;Follows one step commands inconsistently Safety/Judgement: Decreased awareness of safety;Decreased awareness of deficits Awareness: Intellectual Problem Solving: Slow processing;Decreased initiation;Difficulty sequencing;Requires verbal cues;Requires tactile cues General Comments: more alert when upright, reports she feels like she is in a fog   General Comments       Exercises     Shoulder Instructions      Home Living Family/patient expects to be discharged to:: Private residence Living Arrangements: Children Available Help at Discharge: Family;Available PRN/intermittently Type of Home: House Home Access: Level entry     Home Layout: Multi-level;Able to live on main level with bedroom/bathroom Alternate Level Stairs-Number of Steps: 13 to kitchen Alternate Level Stairs-Rails: Left Bathroom Shower/Tub: Teacher, early years/pre: Standard     Home Equipment: Environmental consultant - 2 wheels;Toilet riser;Tub bench;Cane - single point   Additional Comments: Lives with daughter (who works from home) and son-in-law (who works later in day), another daughter lives nearby; family can provide near 24/7 upon return home if needed post-op      Prior Functioning/Environment Level of Independence: Independent with assistive device(s)        Comments: Mod indep with use of SPC majority of time; drives; HD MWF        OT Problem List: Decreased strength;Decreased activity tolerance;Impaired balance (sitting and/or standing);Decreased knowledge of use of DME or AE;Decreased safety awareness;Pain      OT Treatment/Interventions: Self-care/ADL training;DME and/or AE instruction;Therapeutic activities;Cognitive remediation/compensation;Patient/family education;Balance training    OT Goals(Current goals can be found in the care plan section) Acute  Rehab OT Goals Patient Stated Goal: return home with assist from family OT Goal Formulation: With patient Time For Goal Achievement: 10/01/20 Potential to Achieve Goals: Good ADL Goals Pt Will Perform Eating: with set-up;sitting Pt Will Perform Grooming: with min assist;sitting Pt Will Perform Upper Body Bathing: with mod assist;sitting Pt Will Perform Upper Body Dressing: with min assist;sitting Pt Will Transfer to Toilet: with min assist;ambulating;bedside commode Additional ADL Goal #1: Pt will perform bed mobility with mod assist in preparation for ADL. Additional ADL Goal #2: Pt will follow one step commands with  75% accuracy.  OT Frequency: Min 2X/week   Barriers to D/C:            Co-evaluation PT/OT/SLP Co-Evaluation/Treatment: Yes Reason for Co-Treatment: For patient/therapist safety   OT goals addressed during session: ADL's and self-care      AM-PAC OT "6 Clicks" Daily Activity     Outcome Measure Help from another person eating meals?: Total Help from another person taking care of personal grooming?: Total Help from another person toileting, which includes using toliet, bedpan, or urinal?: Total Help from another person bathing (including washing, rinsing, drying)?: Total Help from another person to put on and taking off regular upper body clothing?: Total Help from another person to put on and taking off regular lower body clothing?: Total 6 Click Score: 6   End of Session Nurse Communication: Mobility status  Activity Tolerance: Patient tolerated treatment well Patient left: in chair;with call bell/phone within reach;with chair alarm set;with family/visitor present  OT Visit Diagnosis: Unsteadiness on feet (R26.81);Other abnormalities of gait and mobility (R26.89);Muscle  weakness (generalized) (M62.81);Other symptoms and signs involving cognitive function;Pain                Time: [id number] OT Time Calculation (min): 39 min Charges:  OT General  Charges $OT Visit: 1 Visit OT Evaluation $OT Eval Moderate Complexity: [place], OTR/L Acute Rehabilitation Services Pager: [phone] Office: [phone]  Malka So 09/17/2020, 2:47 PM

## 2020-09-17 NOTE — Progress Notes (Addendum)
Patient ID: [name], female   DOB: 06-26-1960, 60 y.o.   MRN: [id number]  [facility] Progress Note   Assessment/ Plan:   1.  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  Hemodynamically stable postoperatively and unfortunately suffered seizure versus CVA on 8/6  2. ESRD: On hemodialysis on a Monday/Wednesday/Friday schedule as an outpatient and underwent dialysis on 8/6 after scheduling did not permit dialysis on Friday, 8/5. - HD today per her MWF schedule - will follow AM labs - please choose an alternative to morphine for pain given ESRD  3. Anemia: continue ESA  - aranesp 100 mcg every Friday   4. CKD-MBD: Calcium and phosphorus level acceptable range, continue to follow trend with hemodialysis with subsequent labs. On sensipar  5.  Acute neurological changes: With evidence of seizure and likely CVA (imaging pending)-ongoing neurology evaluation.  for MRI per team  Subjective:   No events overnight per nursing.  She states dialysis called her this morning.  Last HD on 8/6 off schedule with 2.5 kg UF  Review of systems:  Denies shortness of breath or chest pain Denies n/v   Objective:   BP (!) 168/66   Pulse 69   Temp 98.8 F (37.1 C) (Oral)   Resp 11   Ht '5\' 2"'$  (1.575 m)   Wt 66.5 kg   SpO2 93%   BMI 26.81 kg/m   Physical Exam:  General adult female in bed in no acute distress HEENT normocephalic atraumatic sclera anicteric Neck supple trachea midline Lungs clear to auscultation bilaterally normal work of breathing at rest on room air Heart S1S2 no rub Abdomen soft nontender nondistended Extremities no edema  Psych normal mood and affect Access LUE AVF bruit and thrill   Labs: BMET Recent Labs  Lab 09/12/20 1946 09/13/20 0333 09/13/20 0807 09/13/20 1025 09/13/20 1044 09/13/20 1124 09/13/20 1251 09/13/20 1940 09/13/20 2009 09/13/20 2105  09/13/20 2152 09/14/20 0500 09/14/20 1710 09/15/20 0440 09/16/20 0405  NA 132* 130*   < > 133*   < > 133*   < > 134* 135 133* 134* 132* 132* 130* 133*  K 3.4* 3.3*   < > 4.3   < > 4.1   < > 3.7 3.7 3.7 3.8 3.7 4.8 5.0 4.2  CL 97* 92*   < > 97*  --  96*  --  100  --   --   --  98 98 96* 97*  CO2 26 29  --   --   --   --   --  25  --   --   --  '25 24 24 27  '$ GLUCOSE 153* 111*   < > 112*  --  143*  --  84  --   --   --  102* 76 88 91  BUN 7 <5*   < > 8  --  9  --  13  --   --   --  15 20 25* 21*  CREATININE 2.57* 3.13*   < > 3.10*  --  3.10*  --  3.42*  --   --   --  3.92* 4.72* 5.26* 4.28*  CALCIUM 9.3 9.8  --   --   --   --   --  8.8*  --   --   --  9.1 9.8 9.7 9.3  PHOS 1.5*  --   --   --   --   --   --  3.2  --   --   --   --   --   --   --    < > = values in this interval not displayed.   CBC Recent Labs  Lab 09/14/20 0500 09/14/20 1710 09/15/20 0440 09/16/20 0405  WBC 9.0 9.8 10.0 8.1  HGB 11.2* 11.2* 10.6* 10.4*  HCT 34.6* 34.1* 32.4* 31.3*  MCV 90.3 89.5 90.5 89.9  PLT 128* 114* 107* 113*      Medications:     sodium chloride   Intravenous Once   acetaminophen  1,000 mg Oral Q6H   Or   acetaminophen (TYLENOL) oral liquid 160 mg/5 mL  1,000 mg Per Tube Q6H   acetaminophen (TYLENOL) oral liquid 160 mg/5 mL  650 mg Per Tube Once   Or   acetaminophen  650 mg Rectal Once   allopurinol  100 mg Oral QPM   aspirin EC  325 mg Oral Daily   Or   aspirin  324 mg Per Tube Daily   bisacodyl  10 mg Oral Daily   Or   bisacodyl  10 mg Rectal Daily   calcium acetate  2,001 mg Oral TID WC   chlorhexidine  15 mL Mouth Rinse BID   Chlorhexidine Gluconate Cloth  6 each Topical Daily   cholecalciferol  1,000 Units Oral QPM   cinacalcet  30 mg Oral Q T,Th,Sa-HD   darbepoetin (ARANESP) injection - DIALYSIS  100 mcg Intravenous Q Fri-HD   docusate sodium  200 mg Oral Daily   enoxaparin (LOVENOX) injection  30 mg Subcutaneous Q24H   ferrous sulfate  325 mg Oral Once per day on Mon  Wed Fri   fluticasone  1 spray Each Nare Daily   folic acid  1 mg Oral Daily   insulin aspart  0-24 Units Subcutaneous Q4H   mouth rinse  15 mL Mouth Rinse q12n4p   metoprolol tartrate  12.5 mg Oral BID   Or   metoprolol tartrate  12.5 mg Per Tube BID   montelukast  10 mg Oral Q supper   nystatin  5 mL Oral QID   pantoprazole  40 mg Oral Daily   polyethylene glycol  17 g Oral Daily   polyvinyl alcohol  1 drop Both Eyes TID AC & HS   predniSONE  5 mg Oral Q breakfast   saccharomyces boulardii  250 mg Oral Daily   sodium chloride flush  3 mL Intravenous Q12H   umeclidinium bromide  1 puff Inhalation Daily   Claudia Desanctis, MD 09/17/2020, 6:54 AM

## 2020-09-17 NOTE — Progress Notes (Signed)
      [address]       [place], [address]             [phone]      POD # 4 CABG  Sleeping presently  BP 139/66   Pulse 72   Temp 98.9 F (37.2 C) (Axillary)   Resp 12   Ht '5\' 2"'$  (1.575 m)   Wt 65.6 kg   SpO2 93%   BMI 26.45 kg/m  In SR on IV amiodarone   Intake/Output Summary (Last 24 hours) at 09/17/2020 1816 Last data filed at 09/17/2020 1800 Gross per 24 hour  Intake 871.31 ml  Output 2000 ml  Net -1128.69 ml  2 L off with HD today  Continue current Rx. Delirium better, but still relatively sedated  Remo Lipps C. Roxan Hockey, MD [facility] [phone]

## 2020-09-17 NOTE — Progress Notes (Signed)
SLP Cancellation Note  Patient Details Name: ETHELMAE BOITNOTT MRN: [id number] DOB: [birth year]   Cancelled treatment:       Reason Eval/Treat Not Completed: Patient at procedure or test/unavailable (getting HD at the moment). Will f/u as able.     Osie Bond., M.A. [place] Acute Rehabilitation Services Pager [phone] Office [phone]  09/17/2020, 9:32 AM

## 2020-09-17 NOTE — Consult Note (Addendum)
NAME:  Jodi Bell, MRN:  [id number], DOB:  March 05, 1960, LOS: 9 ADMISSION DATE:  09/08/2020, CONSULTATION DATE: 09/15/2020 REFERRING MD: Dr. Kipp Brood, CHIEF COMPLAINT: Possible seizure  History of Present Illness:  This is a 60 year old female, past medical history of ESRD on dialysis, CAD, COPD, type 2 diabetes.  Patient is here in the hospital status post CABG.  Last night she was doing well until around 5 AM.  She started having spasming in her legs right arm flexion head turn to the right became unresponsive.  Felt to have seizure-like activity lasting approximately 1 minute.  Post seizure had left-sided weakness and a code stroke was called.  Patient was seen by neurology and sent down for a CT scan of the head which was negative.  Also had a spot EEG.  Now plan for long-term video EEG.  Critical care was consulted for recommendations and management.  She did receive Ativan and was more somnolent after this event.  Pertinent  Medical History   has a past medical history of Anemia of chronic disease, Anxiety, Arthritis, Asthma, Back pain, chronic, Candida infection (finished tx 2 days ago), Chronic insomnia, COPD (chronic obstructive pulmonary disease) ([place]), Coronary artery calcification seen on CT scan, Diabetes mellitus, Eczema, ESRD on dialysis ([place]), Essential hypertension, GERD (gastroesophageal reflux disease), Gout, History of hiatal hernia, Hypoalbuminemia, Hyponatremia, Mild aortic stenosis, Mitral regurgitation, Mitral stenosis, Obesity, PAD (peripheral artery disease) ([place]), Pneumonia (few yrs ago x 2), Sleep apnea, Tobacco abuse, and Upper GI bleed (05/2019).   Significant Hospital Events: Including procedures, antibiotic start and stop dates in addition to other pertinent events   8/6 Early morning, seizure-like activity. 8/8 LTM EEG negative, very awake on nasal cannula this morning, neurology has signed off and DC'd MRI and Keppra  Interim History / Subjective:   Refusing to  wear BiPAP more than a couple hours last night, no further myoclonic activity.  Awake and talkative this morning, good oxygen saturations on nasal cannula. 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  Objective   Blood pressure (!) 168/66, pulse 69, temperature 98.8 F (37.1 C), temperature source Oral, resp. rate 11, height '5\' 2"'$  (1.575 m), weight 66.5 kg, SpO2 93 %.    FiO2 (%):  [30 %] 30 %   Intake/Output Summary (Last 24 hours) at 09/17/2020 0735 Last data filed at 09/17/2020 0400 Gross per 24 hour  Intake 470.68 ml  Output --  Net 470.68 ml    Filed Weights   09/15/20 1201 09/16/20 0500 09/17/20 0500  Weight: 65 kg 66.7 kg 66.5 kg    General: Well-nourished, chronically ill appearing female, sitting up in chair, no distress HEENT: MM pink/moist Neuro: Awake, conversational, moving all extremities, protecting airway CV: s1s2 RRR, no m/r/g PULM: Clear bilaterally without wheezing or rhonchi, no accessory muscle use or tachypnea, adequate oxygen saturations on nasal cannula GI: soft, mildly distended, nontender to palpation Extremities: warm/dry, no edema  Skin: no rashes or lesions   Resolved Hospital Problem list     Assessment & Plan:   Acute metabolic encephalopathy Concern for seizure EEG negative for epileptiform discharges Seen by neurology, initial head CT negative, patient has had no further myoclonic activity and there is low suspicion for new onset seizures or CVA Interactive and awake this morning P: -Improving, neurology has signed off and discontinued MRI and antiseizure medications -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, if recurs or worsens plan to get MRI -Patient appears improved, she is protecting her airway  Acute hypoxic respiratory failure Improving, able to tolerate room air some this morning, now back on nasal cannula P: -Continue BiPAP at night as tolerated and nasal cannula to maintain O2 saturations>  92%    NSTEMI Coronary artery disease status post CABG Plan: -Postop care per cardiothoracic surgery  End-stage renal disease on IHD Plan: -Continue HD per nephrology management, on M/W/F schedule   Best Practice (right click and "Reselect all SmartList Selections" daily)   Diet/type: dysphagia diet (see orders) DVT prophylaxis: SCD GI prophylaxis: PPI Lines: yes and it is still needed Foley:  N/A Code Status:  full code Last date of multidisciplinary goals of care discussion [per primary]  Labs   CBC: Recent Labs  Lab 09/14/20 0500 09/14/20 1710 09/15/20 0440 09/16/20 0405 09/17/20 0655  WBC 9.0 9.8 10.0 8.1 8.7  HGB 11.2* 11.2* 10.6* 10.4* 9.8*  HCT 34.6* 34.1* 32.4* 31.3* 30.0*  MCV 90.3 89.5 90.5 89.9 90.1  PLT 128* 114* 107* 113* 114*     Basic Metabolic Panel: Recent Labs  Lab 09/12/20 1946 09/13/20 0333 09/13/20 1940 09/13/20 2009 09/14/20 0500 09/14/20 1710 09/15/20 0440 09/16/20 0405 09/17/20 0655  NA 132*   < > 134*   < > 132* 132* 130* 133* 131*  K 3.4*   < > 3.7   < > 3.7 4.8 5.0 4.2 4.1  CL 97*   < > 100  --  98 98 96* 97* 97*  CO2 26   < > 25  --  '25 24 24 27 23  '$ GLUCOSE 153*   < > 84  --  102* 76 88 91 106*  BUN 7   < > 13  --  15 20 25* 21* 31*  CREATININE 2.57*   < > 3.42*  --  3.92* 4.72* 5.26* 4.28* 5.41*  CALCIUM 9.3   < > 8.8*  --  9.1 9.8 9.7 9.3 9.2  MG  --   --   --   --  1.8 2.0  --   --  1.8  PHOS 1.5*  --  3.2  --   --   --   --   --  3.2  3.3   < > = values in this interval not displayed.    GFR: Estimated Creatinine Clearance: 9.9 mL/min (A) (by C-G formula based on SCr of 5.41 mg/dL (H)). Recent Labs  Lab 09/14/20 1710 09/15/20 0440 09/16/20 0405 09/17/20 0655  WBC 9.8 10.0 8.1 8.7     Liver Function Tests: Recent Labs  Lab 09/12/20 1946 09/13/20 0333 09/13/20 1940 09/17/20 0655  AST  --  13* 16  --   ALT  --  9 9  --   ALKPHOS  --  68 45  --   BILITOT  --  0.4 0.5  --   PROT  --  5.0* 4.0*  --    ALBUMIN 2.5* 2.2* 2.2* 1.8*    No results for input(s): LIPASE, AMYLASE in the last 168 hours. No results for input(s): AMMONIA in the last 168 hours.  ABG    Component Value Date/Time   PHART 7.311 (L) 09/13/2020 2152   PCO2ART 50.6 (H) 09/13/2020 2152   PO2ART 170 (H) 09/13/2020 2152   HCO3 25.5 09/13/2020 2152   TCO2 27 09/13/2020 2152   ACIDBASEDEF 1.0 09/13/2020 2152   O2SAT 99.0 09/13/2020 2152      Coagulation Profile: Recent Labs  Lab 09/12/20 2021 09/13/20 1247 09/13/20 2000  INR 1.1 1.7* 1.3*  Cardiac Enzymes: No results for input(s): CKTOTAL, CKMB, CKMBINDEX, TROPONINI in the last 168 hours.  HbA1C: Hgb A1c MFr Bld  Date/Time Value Ref Range Status  09/13/2020 03:33 AM 5.2 4.8 - 5.6 % Final    Comment:    (NOTE) Pre diabetes:          5.7%-6.4%  Diabetes:              >6.4%  Glycemic control for   <7.0% adults with diabetes   09/09/2020 03:00 AM 5.2 4.8 - 5.6 % Final    Comment:    (NOTE) Pre diabetes:          5.7%-6.4%  Diabetes:              >6.4%  Glycemic control for   <7.0% adults with diabetes     CBG: Recent Labs  Lab 09/16/20 2012 09/16/20 2248 09/16/20 2331 09/17/20 0016 09/17/20 0319  GLUCAP 91 90 71 167* 109*     Review of Systems:   Unable to obtain due to critical illness   Past Medical History:  She,  has a past medical history of Anemia of chronic disease, Anxiety, Arthritis, Asthma, Back pain, chronic, Candida infection (finished tx 2 days ago), Chronic insomnia, COPD (chronic obstructive pulmonary disease) ([place]), Coronary artery calcification seen on CT scan, Diabetes mellitus, Eczema, ESRD on dialysis ([place]), Essential hypertension, GERD (gastroesophageal reflux disease), Gout, History of hiatal hernia, Hypoalbuminemia, Hyponatremia, Mild aortic stenosis, Mitral regurgitation, Mitral stenosis, Obesity, PAD (peripheral artery disease) ([place]), Pneumonia (few yrs ago x 2), Sleep apnea, Tobacco abuse, and Upper GI  bleed (05/2019).   Surgical History:   Past Surgical History:  Procedure Laterality Date   BACK SURGERY  2016   lower back fusion with cage   BIOPSY  09/23/2017   Procedure: BIOPSY;  Surgeon: Wonda Horner, MD;  Location: [facility];  Service: Endoscopy;;   BIOPSY  05/19/2019   Procedure: BIOPSY;  Surgeon: Otis Brace, MD;  Location: [facility];  Service: Gastroenterology;;   CESAREAN SECTION  1990   x 1    COLONOSCOPY WITH PROPOFOL N/A 09/23/2017   Procedure: COLONOSCOPY WITH PROPOFOL Hemostatic clips placed;  Surgeon: Wonda Horner, MD;  Location: [facility];  Service: Endoscopy;  Laterality: N/A;   CORONARY ARTERY BYPASS GRAFT N/A 09/13/2020   Procedure: CORONARY ARTERY BYPASS GRAFTING (CABG), ON PUMP, TIMES TWO, USING LEFT INTERNAL MAMMARY ARTERY AND RIGHT ENDOSCOPICALLY HARVESTED GREATER SAPHENOUS VEIN;  Surgeon: Gaye Pollack, MD;  Location: [place];  Service: Open Heart Surgery;  Laterality: N/A;   DILATION AND CURETTAGE OF UTERUS  1988   ESOPHAGOGASTRODUODENOSCOPY (EGD) WITH PROPOFOL N/A 09/23/2017   Procedure: ESOPHAGOGASTRODUODENOSCOPY (EGD) WITH PROPOFOL;  Surgeon: Wonda Horner, MD;  [facility];  Service: Endoscopy;  Laterality: N/A;   ESOPHAGOGASTRODUODENOSCOPY (EGD) WITH PROPOFOL N/A 05/19/2019   Procedure: ESOPHAGOGASTRODUODENOSCOPY (EGD) WITH PROPOFOL;  Surgeon: Otis Brace, MD;  Location: [place];  Service: Gastroenterology;  Laterality: N/A;   GIVENS CAPSULE STUDY N/A 05/19/2019   Procedure: GIVENS CAPSULE STUDY;  Surgeon: Otis Brace, MD;  Location: [place];  Service: Gastroenterology;  Laterality: N/A;   HOT HEMOSTASIS N/A 05/19/2019   Procedure: HOT HEMOSTASIS (ARGON PLASMA COAGULATION/BICAP);  Surgeon: Otis Brace, MD;  [facility];  Service: Gastroenterology;  Laterality: N/A;   LEFT HEART CATH AND CORONARY ANGIOGRAPHY N/A 09/10/2020   Procedure: LEFT HEART CATH AND CORONARY ANGIOGRAPHY;  Surgeon: Nelva Bush,  MD;  Location: [place] [facility];  Service: Cardiovascular;  Laterality: N/A;   PARTIAL KNEE ARTHROPLASTY Left 11/12/2017   Procedure: left unicompartmental arthroplasty-medial;  Surgeon: Paralee Cancel, MD;  Location: [facility];  Service: Orthopedics;  Laterality: Left;  49mn   SUBMUCOSAL INJECTION  09/23/2017   Procedure: SUBMUCOSAL INJECTION;  Surgeon: GWonda Horner MD;  Location[facility];  Service: Endoscopy;;  in colon   TEE WITHOUT CARDIOVERSION N/A 09/13/2020   Procedure: TRANSESOPHAGEAL ECHOCARDIOGRAM (TEE);  Surgeon: BGaye Pollack MD;  [place] [facility]  Service: Open Heart Surgery;  Laterality: N/A;   TUBAL LIGATION  1990     Social History:   reports that she has been smoking cigarettes. She has a 20.00 pack-year smoking history. She has never used smokeless tobacco. She reports current alcohol use. She reports that she does not use drugs.   Family History:  Her family history includes Arthritis in her daughter and daughter; Asthma in her daughter; Diabetes in her sister; Heart disease in her sister and another family member; Lung cancer in her mother; Thyroid cancer in an other family member.   Allergies Allergies  Allergen Reactions   3-Methyl-2-Benzothiazolinone Hydrazone Other (See Comments)    Muscle weakness muscle cramping in legs Other reaction(s): Myalgias (Muscle Pain)   Banana Anaphylaxis and Swelling    TONGUE AND MOUTH SWELLING   Black Walnut Flavor Anaphylaxis    Walnuts   Hazelnut ([place]) Allergy Skin Test Anaphylaxis    Hazelnuts   Leflunomide And Related Other (See Comments)    Severe Headache   Lisinopril Swelling    Angioedema  Swelling of the face   No [employer] Food Allergies Anaphylaxis    Spicy Mustard 4.7.2021 Pt reports that she eats Regular Yellow Mustard   Other Other (See Comments), Anaphylaxis and Swelling    Cosentyx= angioedema  Other reaction(s): Facial Edema (intolerance) Mouth itches and tongue swelling Hazel nuts and  pecans also Walnuts Walnuts Renal failure Other reaction(s): Other Serotonin Syndrome  Serotonin syndrome  Tremors Other reaction(s): Other Tremors Muscle weakness muscle cramping in legs Other reaction(s): Myalgias (Muscle Pain) Mouth itches and tongue swelling Hazel nuts and pecans also Walnuts    Pecan Extract Allergy Skin Test Anaphylaxis    Pecans    Pecan Nut (Diagnostic) Anaphylaxis    Pecans   Trazodone And Nefazodone Other (See Comments)    Serotonin Syndrome  Tremors   Adalimumab Other (See Comments)    Blisters on abdomen =humira   Escitalopram Oxalate Other (See Comments)    Hand problems - Serotonin Syndrome     Ezetimibe Other (See Comments)    myalgias cramps    Secukinumab Swelling    Cosentyx = angiodema    Statins Other (See Comments)    Leg pains and weakness   Ibuprofen Other (See Comments)    Renal Problems     Home Medications  Prior to Admission medications   Medication Sig Start Date End Date Taking? Authorizing Provider  acetaminophen (TYLENOL) 500 MG tablet Take 1,000 mg by mouth every 6 (six) hours as needed for mild pain.   Yes [provider]  albuterol (PROVENTIL) (2.5 MG/3ML) 0.083% nebulizer solution Take 2.5 mg by nebulization as needed for wheezing or shortness of breath. 04/15/19  Yes [provider]  allopurinol (ZYLOPRIM) 100 MG tablet Take 100 mg by mouth every evening.   Yes [provider]  amLODipine (NORVASC) 10 MG tablet Take 10 mg by mouth daily.   Yes [provider]  calcium acetate (PHOSLO) 667 MG capsule Take 3 capsules  by mouth in the morning and at bedtime. Take 2 capsules with snacks. 10/25/18  Yes [provider]  carboxymethylcellulose 1 % ophthalmic solution Place 1 drop into both eyes 4 (four) times daily.   Yes [provider]  cetirizine (ZYRTEC) 10 MG tablet Take 10 mg by mouth 2 (two) times daily as needed (hives).  06/13/17  Yes Shelda Pal, DO   Cholecalciferol (VITAMIN D3) 10 MCG (400 UNIT) tablet Take 400 Units by mouth every evening.   Yes [provider]  cloNIDine (CATAPRES) 0.3 MG tablet Take 1 tablet (0.3 mg total) by mouth 3 (three) times daily. 05/21/19  Yes Hosie Poisson, MD  cyclobenzaprine (FLEXERIL) 10 MG tablet Take 10 mg by mouth 3 (three) times daily as needed for muscle spasms. 06/08/20  Yes [provider]  ELDERBERRY PO Take 2 drops by mouth daily.   Yes [provider]  ferrous sulfate 325 (65 FE) MG tablet Take 325 mg by mouth 3 (three) times a week. TUE THUR SAT   Yes [provider]  fluticasone (FLONASE) 50 MCG/ACT nasal spray Place 1 spray into both nostrils daily.   Yes [provider]  folic acid (FOLVITE) [id number] MCG tablet Take 400 mcg by mouth daily.    Yes [provider]  furosemide (LASIX) 40 MG tablet Take 40 mg by mouth 2 (two) times daily.    Yes [provider]  gabapentin (NEURONTIN) 300 MG capsule Take 1 capsule (300 mg total) by mouth at bedtime as needed (Neuropathic pain). Patient taking differently: Take 300 mg by mouth as needed (pain). 12/14/17 10/20/20 Yes Dana Allan I, MD  hydrALAZINE (APRESOLINE) 100 MG tablet Take 1 tablet (100 mg total) by mouth every 8 (eight) hours. 07/06/20  Yes [name], MD  isosorbide mononitrate (IMDUR) 60 MG 24 hr tablet Take 1 tablet (60 mg total) by mouth daily. 03/02/18  Yes Thurnell Lose, MD  labetalol (NORMODYNE) 200 MG tablet Take 400 mg by mouth 3 (three) times daily.   Yes [provider]  LORazepam (ATIVAN) 1 MG tablet Take 1 mg by mouth every 8 (eight) hours as needed for anxiety. 06/25/20  Yes [provider]  montelukast (SINGULAIR) 10 MG tablet Take 10 mg by mouth daily with supper.    Yes [provider]  multivitamin (RENA-VIT) TABS tablet Take 1 tablet by mouth daily with supper.   Yes [provider]  nitroGLYCERIN (NITROSTAT) 0.4 MG SL tablet  Place 0.4 mg under the tongue every 5 (five) minutes as needed for chest pain. 08/01/20  Yes [provider]  olmesartan (BENICAR) 40 MG tablet Take 40 mg by mouth every evening. 05/06/19  Yes [provider]  oxyCODONE (ROXICODONE) 15 MG immediate release tablet Take 15 mg by mouth 3 (three) times daily as needed for pain. 05/31/20  Yes [provider]  pantoprazole (PROTONIX) 40 MG tablet Take 40 mg by mouth daily. 06/09/20  Yes [provider]  predniSONE (DELTASONE) 5 MG tablet Take 1 tablet (5 mg total) by mouth daily with breakfast. 07/06/20  Yes [name], MD  Probiotic Product (PROBIOTIC PO) Take 1 capsule by mouth daily.   Yes [provider]  Tiotropium Bromide Monohydrate (SPIRIVA RESPIMAT IN) Inhale 2 puffs into the lungs daily.   Yes [provider]  VENTOLIN HFA 108 (90 Base) MCG/ACT inhaler Inhale 1-2 puffs into the lungs every 6 (six) hours as needed for shortness of breath. wheezing 06/13/15  Yes [provider]  XELJANZ 5 MG TABS Take 5 mg by mouth at bedtime. 05/17/19  Yes [provider]  zolpidem (AMBIEN) 5 MG tablet Take 5 mg by mouth at bedtime. 05/05/19  Yes [provider]  cinacalcet (SENSIPAR) 30 MG tablet Take 30 mg by mouth 3 (three) times a week. Tuesday, Thursday, Saturday. Patient not taking: Reported on 09/09/2020    [provider]  EPINEPHrine (EPIPEN) 0.3 mg/0.3 mL SOAJ injection Inject 0.3 mLs (0.3 mg total) into the muscle once. Patient taking differently: Inject 0.3 mg into the muscle as needed (for allergic reaction). 09/30/12   Le, Thao P, DO  JANUVIA 25 MG tablet Take 1 tablet (25 mg total) by mouth daily. Patient not taking: Reported on 09/09/2020 07/06/20   [name], MD    Otilio Carpen Tristyn Pharris, PA-C [facility] See Amion for pager If no response to pager , please call [phone] until 7pm After 7:00 pm call Elink  [phone]?[place]

## 2020-09-17 NOTE — Progress Notes (Signed)
Patient pulling off bipap mask and refuses to wear it.  Educated on reason for wearing Bipap.  Pt continues to refuse to wear Bipap mask.Marland KitchenMarland Kitchen

## 2020-09-17 NOTE — Progress Notes (Signed)
[facility] DAILY ICU PROGRESS NOTE                   [place]            [place] [id number]          [phone]   4 Days Post-Op Procedure(s) (LRB): CORONARY ARTERY BYPASS GRAFTING (CABG), ON PUMP, TIMES TWO, USING LEFT INTERNAL MAMMARY ARTERY AND RIGHT ENDOSCOPICALLY HARVESTED GREATER SAPHENOUS VEIN (N/A) TRANSESOPHAGEAL ECHOCARDIOGRAM (TEE) (N/A)  Total Length of Stay:  LOS: 9 days   Subjective: Somewhat lethargic but answering questions appropriatly  Objective: Vital signs in last 24 hours: Temp:  [97.8 F (36.6 C)-98.8 F (37.1 C)] 98.8 F (37.1 C) (08/08 0400) Pulse Rate:  [65-71] 69 (08/08 0600) Cardiac Rhythm: Normal sinus rhythm (08/08 0600) Resp:  [9-20] 11 (08/08 0100) BP: (125-168)/(62-85) 168/66 (08/08 0600) SpO2:  [86 %-100 %] 93 % (08/08 0600) FiO2 (%):  [30 %] 30 % (08/08 0000) Weight:  [66.5 kg] 66.5 kg (08/08 0500)  Filed Weights   09/15/20 1201 09/16/20 0500 09/17/20 0500  Weight: 65 kg 66.7 kg 66.5 kg    Weight change: -1 kg   Hemodynamic parameters for last 24 hours:    Intake/Output from previous day: 08/07 0701 - 08/08 0700 In: 470.7 [I.V.:470.7] Out: -   Intake/Output this shift: No intake/output data recorded.  Current Meds: Scheduled Meds:  sodium chloride   Intravenous Once   acetaminophen  1,000 mg Oral Q6H   Or   acetaminophen (TYLENOL) oral liquid 160 mg/5 mL  1,000 mg Per Tube Q6H   acetaminophen (TYLENOL) oral liquid 160 mg/5 mL  650 mg Per Tube Once   Or   acetaminophen  650 mg Rectal Once   allopurinol  100 mg Oral QPM   aspirin EC  325 mg Oral Daily   Or   aspirin  324 mg Per Tube Daily   bisacodyl  10 mg Oral Daily   Or   bisacodyl  10 mg Rectal Daily   calcium acetate  2,001 mg Oral TID WC   chlorhexidine  15 mL Mouth Rinse BID   Chlorhexidine Gluconate Cloth  6 each Topical Daily   cholecalciferol  1,000 Units Oral QPM   cinacalcet  30 mg Oral Q T,Th,Sa-HD   darbepoetin (ARANESP) injection -  DIALYSIS  100 mcg Intravenous Q Fri-HD   docusate sodium  200 mg Oral Daily   enoxaparin (LOVENOX) injection  30 mg Subcutaneous Q24H   ferrous sulfate  325 mg Oral Once per day on Mon Wed Fri   fluticasone  1 spray Each Nare Daily   folic acid  1 mg Oral Daily   insulin aspart  0-24 Units Subcutaneous Q4H   mouth rinse  15 mL Mouth Rinse q12n4p   metoprolol tartrate  12.5 mg Oral BID   Or   metoprolol tartrate  12.5 mg Per Tube BID   montelukast  10 mg Oral Q supper   nystatin  5 mL Oral QID   pantoprazole  40 mg Oral Daily   polyethylene glycol  17 g Oral Daily   polyvinyl alcohol  1 drop Both Eyes TID AC & HS   predniSONE  5 mg Oral Q breakfast   saccharomyces boulardii  250 mg Oral Daily   sodium chloride flush  3 mL Intravenous Q12H   umeclidinium bromide  1 puff Inhalation Daily   Continuous Infusions:  sodium chloride Stopped (09/14/20 1059)   sodium chloride 250  mL (09/14/20 0509)   sodium chloride 10 mL/hr at 09/16/20 1610   amiodarone 30 mg/hr (09/17/20 0400)   dextrose 5 % and 0.9% NaCl 10 mL/hr at 09/16/20 1800   lactated ringers     lactated ringers     magnesium sulfate     niCARDipine Stopped (09/15/20 0818)   PRN Meds:.sodium chloride, albuterol, dextrose, food thickener, gabapentin, heparin, heparin, lidocaine (PF), lidocaine-prilocaine, metoprolol tartrate, morphine injection, ondansetron (ZOFRAN) IV, oxyCODONE, pentafluoroprop-tetrafluoroeth, sodium chloride flush  General appearance: alert, cooperative, distracted, fatigued, and no distress Neurologic: grossly intact, does slur words a bit at times Heart: regular rate and rhythm Lungs: clear anteriorly Abdomen: + distension, non tender Extremities: PAS in place, no edema Wound: incis healing well  Lab Results: CBC: Recent Labs    09/16/20 0405 09/17/20 0655  WBC 8.1 8.7  HGB 10.4* 9.8*  HCT 31.3* 30.0*  PLT 113* 114*   BMET:  Recent Labs    09/16/20 0405 09/17/20 0655  NA 133* 131*  K 4.2  4.1  CL 97* 97*  CO2 27 23  GLUCOSE 91 106*  BUN 21* 31*  CREATININE 4.28* 5.41*  CALCIUM 9.3 9.2    CMET: Lab Results  Component Value Date   WBC 8.7 09/17/2020   HGB 9.8 (L) 09/17/2020   HCT 30.0 (L) 09/17/2020   PLT 114 (L) 09/17/2020   GLUCOSE 106 (H) 09/17/2020   CHOL 193 09/10/2020   TRIG 60 09/10/2020   HDL 69 09/10/2020   LDLCALC 112 (H) 09/10/2020   ALT 9 09/13/2020   AST 16 09/13/2020   NA 131 (L) 09/17/2020   K 4.1 09/17/2020   CL 97 (L) 09/17/2020   CREATININE 5.41 (H) 09/17/2020   BUN 31 (H) 09/17/2020   CO2 23 09/17/2020   TSH 4.375 07/03/2020   INR 1.3 (H) 09/13/2020   HGBA1C 5.2 09/13/2020      PT/INR: No results for input(s): LABPROT, INR in the last 72 hours. Radiology: Overnight EEG with video  Result Date: 09/16/2020 Lora Havens, MD     09/16/2020 10:44 AM Patient Name: Jodi Bell MRN: [id number] Epilepsy Attending: Lora Havens Referring Physician/Provider: : Dr Linnell Fulling Duration: 09/15/2020 [id number] 09/15/2020 0755  Patient history: 60 yo F with recent CABG with new onset seizure and AMS. EEG to evaluate for seizure  Level of alertness: lethargic  AEDs during EEG study: LEV Technical aspects: This EEG study was done with scalp electrodes positioned according to the 10-20 International system of electrode placement. Electrical activity was acquired at a sampling rate of '500Hz'$  and reviewed with a high frequency filter of '70Hz'$  and a low frequency filter of '1Hz'$ . EEG data were recorded continuously and digitally stored. Description: No  posterior dominant rhythm was seen.  EEG showed continuous generalized 5 to 6 Hz theta as well as intermittent generalized 2-'3Hz'$  delta slowing. Hyperventilation and photic stimulation were not performed.   ABNORMALITY - Continuous slow, generalized IMPRESSION: This study is suggestive of moderate diffuse encephalopathy, nonspecific etiology. No seizures or epileptiform discharges were seen throughout the recording.  Priyanka [name]     Assessment/Plan: S/P Procedure(s) (LRB): CORONARY ARTERY BYPASS GRAFTING (CABG), ON PUMP, TIMES TWO, USING LEFT INTERNAL MAMMARY ARTERY AND RIGHT ENDOSCOPICALLY HARVESTED GREATER SAPHENOUS VEIN (N/A) TRANSESOPHAGEAL ECHOCARDIOGRAM (TEE) (N/A)   1 afeb SBP 130'-160's, sinus rhythm, on amio gtt, poss change to po soon. cardene is off 2 sats ok on RA 3 H/H stable 4 no leukocytosis 5 mild hyponatremia with lower  trend 6 magnesium 1.8- replace 7 thrombocytopenia- stable 8 dialysis/renal issues- as per nephrology 9 EEG , neg for siezures, + moderate diffuse encephalopathy, Keppra/dilantin has been stopped- Neuro signed off for now and doesn't think we currently need MRI  10 PCCM assisting with medical management 11 rehab and pulm toilet as able    [name] PA-C Pager [phone] 09/17/2020 7:33 AM

## 2020-09-18 ENCOUNTER — Inpatient Hospital Stay (HOSPITAL_COMMUNITY): Payer: Medicare PPO

## 2020-09-18 LAB — BASIC METABOLIC PANEL
Anion gap: 8 (ref 5–15)
BUN: 19 mg/dL (ref 6–20)
CO2: 29 mmol/L (ref 22–32)
Calcium: 9.2 mg/dL (ref 8.9–10.3)
Chloride: 96 mmol/L — ABNORMAL LOW (ref 98–111)
Creatinine, Ser: 4.01 mg/dL — ABNORMAL HIGH (ref 0.44–1.00)
GFR, Estimated: 12 mL/min — ABNORMAL LOW (ref >60)
Glucose, Bld: 106 mg/dL — ABNORMAL HIGH (ref 70–99)
Potassium: 3.4 mmol/L — ABNORMAL LOW (ref 3.5–5.1)
Sodium: 133 mmol/L — ABNORMAL LOW (ref 135–145)

## 2020-09-18 LAB — ECHO INTRAOPERATIVE TEE
AV Mean grad: 18 mmHg
Height: 62 in
Weight: 2163.2 oz

## 2020-09-18 LAB — CBC
HCT: 26.5 % — ABNORMAL LOW (ref 36.0–46.0)
Hemoglobin: 9 g/dL — ABNORMAL LOW (ref 12.0–15.0)
MCH: 29.9 pg (ref 26.0–34.0)
MCHC: 34 g/dL (ref 30.0–36.0)
MCV: 88 fL (ref 80.0–100.0)
Platelets: 96 10*3/uL — ABNORMAL LOW (ref 150–400)
RBC: 3.01 MIL/uL — ABNORMAL LOW (ref 3.87–5.11)
RDW: 23.7 % — ABNORMAL HIGH (ref 11.5–15.5)
WBC: 7 10*3/uL (ref 4.0–10.5)
nRBC: 0 % (ref 0.0–0.2)

## 2020-09-18 LAB — GLUCOSE, CAPILLARY
Glucose-Capillary: 93 mg/dL (ref 70–99)
Glucose-Capillary: 85 mg/dL (ref 70–99)
Glucose-Capillary: 145 mg/dL — ABNORMAL HIGH (ref 70–99)
Glucose-Capillary: 180 mg/dL — ABNORMAL HIGH (ref 70–99)
Glucose-Capillary: 71 mg/dL (ref 70–99)

## 2020-09-18 MED ORDER — POTASSIUM CHLORIDE CRYS ER 20 MEQ PO TBCR
20.0000 meq | EXTENDED_RELEASE_TABLET | Freq: Once | ORAL | Status: AC
  Administered 2020-09-18: 20 meq via ORAL
  Filled 2020-09-18: qty 1

## 2020-09-18 MED ORDER — AMLODIPINE BESYLATE 10 MG PO TABS
10.0000 mg | ORAL_TABLET | Freq: Every day | ORAL | Status: DC
  Administered 2020-09-18 – 2020-10-04 (×17): 10 mg via ORAL
  Filled 2020-09-18 (×18): qty 1

## 2020-09-18 MED ORDER — PANTOPRAZOLE SODIUM 40 MG PO TBEC
40.0000 mg | DELAYED_RELEASE_TABLET | Freq: Two times a day (BID) | ORAL | Status: DC
  Administered 2020-09-18 – 2020-09-19 (×3): 40 mg via ORAL
  Filled 2020-09-18 (×4): qty 1

## 2020-09-18 MED ORDER — MAGNESIUM OXIDE -MG SUPPLEMENT 400 (240 MG) MG PO TABS
200.0000 mg | ORAL_TABLET | Freq: Every day | ORAL | Status: DC
  Administered 2020-09-18 – 2020-10-04 (×16): 200 mg via ORAL
  Filled 2020-09-18 (×15): qty 1

## 2020-09-18 MED ORDER — IRBESARTAN 150 MG PO TABS
150.0000 mg | ORAL_TABLET | Freq: Every day | ORAL | Status: DC
  Administered 2020-09-18 – 2020-09-19 (×2): 150 mg via ORAL
  Filled 2020-09-18 (×3): qty 1

## 2020-09-18 MED ORDER — AMIODARONE HCL 200 MG PO TABS
200.0000 mg | ORAL_TABLET | Freq: Two times a day (BID) | ORAL | Status: DC
  Administered 2020-09-18 – 2020-10-04 (×30): 200 mg via ORAL
  Filled 2020-09-18 (×30): qty 1

## 2020-09-18 MED ORDER — CHLORHEXIDINE GLUCONATE CLOTH 2 % EX PADS
6.0000 | MEDICATED_PAD | Freq: Every day | CUTANEOUS | Status: DC
  Administered 2020-09-19: 6 via TOPICAL

## 2020-09-18 MED ORDER — POTASSIUM CHLORIDE 10 MEQ/50ML IV SOLN
10.0000 meq | INTRAVENOUS | Status: DC
  Administered 2020-09-18: 10 meq via INTRAVENOUS
  Filled 2020-09-18 (×3): qty 50

## 2020-09-18 MED ORDER — FENTANYL CITRATE (PF) 100 MCG/2ML IJ SOLN
25.0000 ug | INTRAMUSCULAR | Status: DC | PRN
Start: 2020-09-18 — End: 2020-09-25
  Administered 2020-09-21 – 2020-09-22 (×2): 25 ug via INTRAVENOUS
  Filled 2020-09-18 (×2): qty 2

## 2020-09-18 NOTE — Progress Notes (Signed)
  Speech Language Pathology Treatment: Dysphagia  Patient Details Name: Jodi Bell MRN: [id number] DOB: Dec 14, 1960 Today's Date: 09/18/2020 Time: [id number] SLP Time Calculation (min) (ACUTE ONLY): 15 min  Assessment / Plan / Recommendation Clinical Impression  Pt is alert and communicative this morning, sitting upright in her chair. She denies trouble swallowing but also does not want to place even her upper dentures this morning, and these are the ones she typically eats with at home. She says that she prefers her adhesive at home and wants to see if her daughter can bring it to her before she dons them. She attempted small bites of regular textures without them but takes minimal amounts, has quite prolonged mastication, and then does not want to try any more. RN reports very prolonged mastication with chopped meats on previous date, when her mentation was also a little more altered. This chopped diet may still be most appropriate at this time, but she did seem to do better with advanced trials of thin liquids today. She had no overt s/s of aspiration immediately following cup or straw sips, although she did cough x1 several minutes after intake had stopped. Pt says she coughs some during the day and additional sips provided did not elicit any further coughing. Although this was not clearly related to swallowing, recommend advancing to thin liquids but RN was also made aware in order to monitor. Will continue to follow with Dys 2 diet and thin liquids.    HPI HPI: 60 y.o. female who presented with sudden onset of SOB. Workup for CHF decompensaiton, COPD exacerbation, HTN urgency, NSTEMI; need for emergent HD. S/p LHC 8/1 which showed severe two-vessel CAD. CABGx2 8/4. Post-procedure had seizure-like activity and left-sided weakness. CTH was negative. PMH includes: ESRD on HD MWF, refractory HTN, chronic diastolic CHF, IIDM, diabetic neuropathy, COPD, cigarette smoker, RA on chronic steroid, CAD, PAD,  chronic anemia      SLP Plan  Continue with current plan of care       Recommendations  Diet recommendations: Dysphagia 2 (fine chop);Thin liquid Liquids provided via: Cup;Straw Medication Administration: Whole meds with puree Supervision: Patient able to self feed;Intermittent supervision to cue for compensatory strategies (set-up assistance) Compensations: Minimize environmental distractions;Slow rate;Small sips/bites Postural Changes and/or Swallow Maneuvers: Seated upright 90 degrees;Upright 30-60 min after meal                Oral Care Recommendations: Oral care BID Follow up Recommendations:  (tba) SLP Visit Diagnosis: Dysphagia, oral phase (R13.11);Dysphagia, unspecified (R13.10) Plan: Continue with current plan of care       GO                 Osie Bond., M.A. [facility] Pager [phone] Office [phone]  09/18/2020, 8:50 AM

## 2020-09-18 NOTE — Progress Notes (Signed)
RT note. Patient placed on bipap at this time 16/6 R12 30% sat 100% and resting comfortable, RT will continue to monitor

## 2020-09-18 NOTE — Plan of Care (Signed)
  Problem: Education: Goal: Knowledge of General Education information will improve Description: Including pain rating scale, medication(s)/side effects and non-pharmacologic comfort measures Outcome: Progressing   Problem: Health Behavior/Discharge Planning: Goal: Ability to manage health-related needs will improve Outcome: Progressing   Problem: Clinical Measurements: Goal: Ability to maintain clinical measurements within normal limits will improve Outcome: Progressing Goal: Will remain free from infection Outcome: Progressing Goal: Diagnostic test results will improve Outcome: Progressing Goal: Respiratory complications will improve Outcome: Progressing Goal: Cardiovascular complication will be avoided Outcome: Progressing   Problem: Activity: Goal: Risk for activity intolerance will decrease Outcome: Progressing   Problem: Elimination: Goal: Will not experience complications related to bowel motility Outcome: Progressing   Problem: Pain Managment: Goal: General experience of comfort will improve Outcome: Progressing   Problem: Safety: Goal: Ability to remain free from injury will improve Outcome: Progressing   Problem: Skin Integrity: Goal: Risk for impaired skin integrity will decrease Outcome: Progressing

## 2020-09-18 NOTE — Progress Notes (Signed)
Dr. Kipp Brood updated 2 daughters at bedside. Questions answered.

## 2020-09-18 NOTE — Progress Notes (Signed)
Physical Therapy Treatment Patient Details Name: Jodi Bell MRN: [id number] DOB: Dec 04, 1960 Today's Date: 09/18/2020    History of Present Illness Pt is a 60 y.o. female admitted 09/08/20 with acute onset SOB. Workup for CHF decompensaiton, COPD exacerbation, HTN urgency, NSTEMI; need for emergent HD. S/p LHC 8/1 which showed severe two-vessel CAD. CABGx2 8/4. PMH includes ESRD (HD MWF), HTN, DM2, COPD, OSA, psoriatic RA, PAD, anemia, tobacco abuse.    PT Comments    Pt much improved today.  Emphasis on sit to stands, reinforcing sternal prec, progression of gait stability/stamina and transitions into bed.   Follow Up Recommendations  Home health PT     Equipment Recommendations   (TBA)    Recommendations for Other Services       Precautions / Restrictions Precautions Precautions: Fall;Sternal Precaution Booklet Issued: Yes (comment) Precaution Comments: reviewed sternal precautions    Mobility  Bed Mobility Overal bed mobility: Needs Assistance Bed Mobility: Sit to Supine       Sit to supine: Mod assist;+2 for safety/equipment        Transfers Overall transfer level: Needs assistance Equipment used: Rolling walker (2 wheeled) Transfers: Sit to/from [facility] Sit to Stand: Min assist;+2 safety/equipment         General transfer comment: cues for hand placement, reinforcing precautions for pt and family  Ambulation/Gait Ambulation/Gait assistance: Min Web designer (Feet): 35 Feet (x2) Assistive device: Rolling walker (2 wheeled) Gait Pattern/deviations: Step-through pattern;Decreased stride length Gait velocity: Decreased Gait velocity interpretation: <1.8 ft/sec, indicate of risk for recurrent falls General Gait Details: Still mildly shaky, but no buckling like evaluation. Slow, mildly unsteady, short steps with need for minimal direction of the RW.   Stairs             Wheelchair Mobility    Modified Rankin (Stroke  Patients Only)       Balance   Sitting-balance support: Feet supported;Single extremity supported Sitting balance-Leahy Scale: Poor Sitting balance - Comments: improved by still reliant on her UE's   Standing balance support: Bilateral upper extremity supported Standing balance-Leahy Scale: Poor Standing balance comment: reliant on external support or AD                            Cognition Arousal/Alertness: Awake/alert Behavior During Therapy: Flat affect Overall Cognitive Status: Impaired/Different from baseline                     Current Attention Level: Sustained Memory: Decreased short-term memory Following Commands: Follows one step commands with increased time;Follows one step commands consistently Safety/Judgement: Decreased awareness of safety;Decreased awareness of deficits Awareness: Emergent Problem Solving: Slow processing;Decreased initiation;Difficulty sequencing;Requires verbal cues;Requires tactile cues        Exercises      General Comments General comments (skin integrity, edema, etc.): vss overall      Pertinent Vitals/Pain Pain Assessment: Faces Faces Pain Scale: Hurts a little bit Pain Location: sternal Pain Descriptors / Indicators: Discomfort;Guarding Pain Intervention(s): Monitored during session    Home Living                      Prior Function            PT Goals (current goals can now be found in the care plan section) Acute Rehab PT Goals Patient Stated Goal: return home with assist from family PT Goal Formulation: With patient Time For Goal Achievement:  09/25/20 Potential to Achieve Goals: Fair Progress towards PT goals: Progressing toward goals    Frequency    Min 3X/week      PT Plan Current plan remains appropriate    Co-evaluation              AM-PAC PT "6 Clicks" Mobility   Outcome Measure  Help needed turning from your back to your side while in a flat bed without using  bedrails?: A Little Help needed moving from lying on your back to sitting on the side of a flat bed without using bedrails?: A Lot Help needed moving to and from a bed to a chair (including a wheelchair)?: A Little Help needed standing up from a chair using your arms (e.g., wheelchair or bedside chair)?: A Little Help needed to walk in hospital room?: A Little Help needed climbing 3-5 steps with a railing? : A Lot 6 Click Score: 16    End of Session Equipment Utilized During Treatment: Gait belt Activity Tolerance: Patient tolerated treatment well;Patient limited by fatigue Patient left: in bed;with call bell/phone within reach;with bed alarm set;with family/visitor present Nurse Communication: Mobility status PT Visit Diagnosis: Other abnormalities of gait and mobility (R26.89);Muscle weakness (generalized) (M62.81);Difficulty in walking, not elsewhere classified (R26.2)     Time: [id number] PT Time Calculation (min) (ACUTE ONLY): 25 min  Charges:  $Gait Training: 8-22 mins $Therapeutic Activity: 8-22 mins                     09/18/2020  Ginger Carne., PT [facility] [phone]  (pager) [phone]  (office)   Tessie Fass Meoshia Billing 09/18/2020, 1:50 PM

## 2020-09-18 NOTE — Progress Notes (Addendum)
[facility] DAILY ICU PROGRESS NOTE                   [address]            [place] [address]          [phone]   5 Days Post-Op Procedure(s) (LRB): CORONARY ARTERY BYPASS GRAFTING (CABG), ON PUMP, TIMES TWO, USING LEFT INTERNAL MAMMARY ARTERY AND RIGHT ENDOSCOPICALLY HARVESTED GREATER SAPHENOUS VEIN (N/A) TRANSESOPHAGEAL ECHOCARDIOGRAM (TEE) (N/A)  Total Length of Stay:  LOS: 10 days   Subjective: Doesn't remember having dialysis yesterday, much more alert and talkative this am  Objective: Vital signs in last 24 hours: Temp:  [98.2 F (36.8 C)-99.1 F (37.3 C)] 98.2 F (36.8 C) (08/09 0700) Pulse Rate:  [61-76] 61 (08/09 0600) Cardiac Rhythm: Normal sinus rhythm (08/09 0400) Resp:  [11-22] 13 (08/09 0600) BP: (107-164)/(54-116) 153/65 (08/09 0600) SpO2:  [87 %-100 %] 99 % (08/09 0600) FiO2 (%):  [30 %] 30 % (08/09 0010) Weight:  [65.1 kg-67.8 kg] 65.1 kg (08/09 0500)  Filed Weights   09/17/20 0730 09/17/20 1100 09/18/20 0500  Weight: 67.8 kg 65.6 kg 65.1 kg    Weight change: 1.3 kg   Hemodynamic parameters for last 24 hours:    Intake/Output from previous day: 08/08 0701 - 08/09 0700 In: 1072.2 [I.V.:975.2; IV Piggyback:97] Out: 2000   Intake/Output this shift: No intake/output data recorded.  Current Meds: Scheduled Meds:  sodium chloride   Intravenous Once   acetaminophen  1,000 mg Oral Q6H   Or   acetaminophen (TYLENOL) oral liquid 160 mg/5 mL  1,000 mg Per Tube Q6H   acetaminophen (TYLENOL) oral liquid 160 mg/5 mL  650 mg Per Tube Once   Or   acetaminophen  650 mg Rectal Once   allopurinol  100 mg Oral QPM   aspirin EC  325 mg Oral Daily   Or   aspirin  324 mg Per Tube Daily   bisacodyl  10 mg Oral Daily   Or   bisacodyl  10 mg Rectal Daily   calcium acetate  2,001 mg Oral TID WC   chlorhexidine  15 mL Mouth Rinse BID   Chlorhexidine Gluconate Cloth  6 each Topical Daily   cholecalciferol  1,000 Units Oral QPM   cinacalcet  30  mg Oral Q T,Th,Sa-HD   darbepoetin (ARANESP) injection - DIALYSIS  100 mcg Intravenous Q Fri-HD   docusate sodium  200 mg Oral Daily   enoxaparin (LOVENOX) injection  30 mg Subcutaneous Q24H   ferrous sulfate  325 mg Oral Once per day on Mon Wed Fri   fluticasone  1 spray Each Nare Daily   folic acid  1 mg Oral Daily   insulin aspart  0-24 Units Subcutaneous Q4H   mouth rinse  15 mL Mouth Rinse q12n4p   metoprolol tartrate  12.5 mg Oral BID   Or   metoprolol tartrate  12.5 mg Per Tube BID   montelukast  10 mg Oral Q supper   nystatin  5 mL Oral QID   pantoprazole  40 mg Oral Daily   polyethylene glycol  17 g Oral Daily   polyvinyl alcohol  1 drop Both Eyes TID AC & HS   predniSONE  5 mg Oral Q breakfast   saccharomyces boulardii  250 mg Oral Daily   sodium chloride flush  3 mL Intravenous Q12H   umeclidinium bromide  1 puff Inhalation Daily   Continuous Infusions:  sodium  chloride Stopped (09/14/20 1059)   sodium chloride 250 mL (09/14/20 0509)   sodium chloride 10 mL/hr at 09/16/20 1610   sodium chloride     sodium chloride     amiodarone 30 mg/hr (09/18/20 0600)   dextrose 5 % and 0.9% NaCl 10 mL/hr at 09/18/20 0600   lactated ringers     lactated ringers     niCARDipine Stopped (09/15/20 0818)   PRN Meds:.sodium chloride, sodium chloride, sodium chloride, albuterol, alteplase, dextrose, food thickener, gabapentin, heparin, heparin, heparin, lidocaine (PF), lidocaine-prilocaine, metoprolol tartrate, morphine injection, ondansetron (ZOFRAN) IV, oxyCODONE, pentafluoroprop-tetrafluoroeth, sodium chloride flush  General appearance: alert, cooperative, fatigued, and no distress Heart: regular rate and rhythm and + systolic murmur Lungs: dim in lower fields Abdomen: less distended, soft, + BS Extremities: no edema Wound: incis healing well  Lab Results: CBC: Recent Labs    09/17/20 0655 09/17/20 1014 09/18/20 0312  WBC 8.7  --  7.0  HGB 9.8* 11.2* 9.0*  HCT 30.0* 33.0*  26.5*  PLT 114*  --  96*   BMET:  Recent Labs    09/17/20 0655 09/17/20 1014 09/18/20 0312  NA 131* 134* 133*  K 4.1 2.9* 3.4*  CL 97*  --  96*  CO2 23  --  29  GLUCOSE 106*  --  106*  BUN 31*  --  19  CREATININE 5.41*  --  4.01*  CALCIUM 9.2  --  9.2    CMET: Lab Results  Component Value Date   WBC 7.0 09/18/2020   HGB 9.0 (L) 09/18/2020   HCT 26.5 (L) 09/18/2020   PLT 96 (L) 09/18/2020   GLUCOSE 106 (H) 09/18/2020   CHOL 193 09/10/2020   TRIG 60 09/10/2020   HDL 69 09/10/2020   LDLCALC 112 (H) 09/10/2020   ALT 9 09/13/2020   AST 16 09/13/2020   NA 133 (L) 09/18/2020   K 3.4 (L) 09/18/2020   CL 96 (L) 09/18/2020   CREATININE 4.01 (H) 09/18/2020   BUN 19 09/18/2020   CO2 29 09/18/2020   TSH 4.375 07/03/2020   INR 1.3 (H) 09/13/2020   HGBA1C 5.2 09/13/2020      PT/INR: No results for input(s): LABPROT, INR in the last 72 hours. Radiology: No results found.   Assessment/Plan: S/P Procedure(s) (LRB): CORONARY ARTERY BYPASS GRAFTING (CABG), ON PUMP, TIMES TWO, USING LEFT INTERNAL MAMMARY ARTERY AND RIGHT ENDOSCOPICALLY HARVESTED GREATER SAPHENOUS VEIN (N/A) TRANSESOPHAGEAL ECHOCARDIOGRAM (TEE) (N/A)   1 Tmax 99.1, VSS w sBP 100's-160's,will begin to resume preop antihypertensives as mostly elevated sinus - will convert to po amiom sinus with PVC's 2 sat sok on 2 liters 3 renal- dialysis/management per nephrology, weight pretty stable , hyponatremia stable, Mg++/ K+ replacement 4 no leukocytosis 5 H/H trended down from yesterday, on Iron/aranesp 6 thrombocytopenia trend a little lower, cont to monitor, may need to consider HIT panel  7 BS adeq controlled 8 will change prn morphine to prn fentanyl with ESRD 9 will get CXR to evaluate for possible pleural effusions  10 push rehab/pulm toilet as able  [name] PA-C Pager [phone] 09/18/2020 7:05 AM   Patient seen and examined, agree with above Working with Speech therapy currently More alert, no  focal motor deficit CXR shows a small right pneumothorax unchanged or slightly improved from 8/6  [place] C. Roxan Hockey, MD [facility] [phone]

## 2020-09-18 NOTE — Progress Notes (Signed)
Patient ID: Jodi Bell, female   DOB: [birth year], 60 y.o.   MRN: [id number]  [facility] Progress Note   Assessment/ Plan:   1.  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  Hemodynamically stable postoperatively and unfortunately suffered seizure versus CVA on 8/6  2. ESRD: On hemodialysis on a Monday/Wednesday/Friday schedule as an outpatient and underwent dialysis on 8/6 after scheduling did not permit dialysis on Friday, 8/5. - HD per her MWF schedule - please choose an alternative to morphine for pain given ESRD  3. Anemia CKD: continue ESA  - She missed her aranesp 100 mcg dose which was to be given on Friday.  She has an allergy reported to a similar agent.  Will get orders from facility to see if on ESA agent  4. CKD-MBD: Calcium and phosphorus level acceptable range, continue to follow trend with hemodialysis with subsequent labs. On sensipar  5.  Acute neurological changes: possible seizure and  MRI deferred per neurology.  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  Per charting improved after HD.   Subjective:   Pulled off her BIPAP overnight per charting. Last HD on 8/8 with 2 kg UF.  She hopes to see family today.  Confirms [facility].   Review of systems:    Denies shortness of breath or chest pain Denies n/v   Objective:   BP (!) 158/62 (BP Location: Right Arm)   Pulse 65   Temp 98.9 F (37.2 C) (Oral)   Resp 11   Ht '5\' 2"'$  (1.575 m)   Wt 65.6 kg   SpO2 98%   BMI 26.45 kg/m   Physical Exam:  General adult female in bed in no acute distress HEENT normocephalic atraumatic sclera anicteric Neck supple trachea midline Lungs clear to auscultation bilaterally normal work of breathing at rest on room air Heart S1S2 no rub Abdomen soft nontender nondistended Extremities no edema  Psych normal mood and affect Neuro - alert and oriented to person and year;  doesn't know where we are Access LUE AVF bruit and thrill   Labs: BMET Recent Labs  Lab 09/12/20 1946 09/13/20 0333 09/13/20 1940 09/13/20 2009 09/14/20 0500 09/14/20 1710 09/15/20 0440 09/16/20 0405 09/17/20 0655 09/17/20 1014 09/18/20 0312  NA 132*   < > 134*   < > 132* 132* 130* 133* 131* 134* 133*  K 3.4*   < > 3.7   < > 3.7 4.8 5.0 4.2 4.1 2.9* 3.4*  CL 97*   < > 100  --  98 98 96* 97* 97*  --  96*  CO2 26   < > 25  --  '25 24 24 27 23  '$ --  29  GLUCOSE 153*   < > 84  --  102* 76 88 91 106*  --  106*  BUN 7   < > 13  --  15 20 25* 21* 31*  --  19  CREATININE 2.57*   < > 3.42*  --  3.92* 4.72* 5.26* 4.28* 5.41*  --  4.01*  CALCIUM 9.3   < > 8.8*  --  9.1 9.8 9.7 9.3 9.2  --  9.2  PHOS 1.5*  --  3.2  --   --   --   --   --  3.2  3.3  --   --    < > = values in this interval not displayed.   CBC Recent Labs  Lab 09/15/20 0440 09/16/20 0405 09/17/20 0655 09/17/20 1014 09/18/20 0312  WBC 10.0 8.1 8.7  --  7.0  HGB 10.6* 10.4* 9.8* 11.2* 9.0*  HCT 32.4* 31.3* 30.0* 33.0* 26.5*  MCV 90.5 89.9 90.1  --  88.0  PLT 107* 113* 114*  --  96*      Medications:     sodium chloride   Intravenous Once   acetaminophen  1,000 mg Oral Q6H   Or   acetaminophen (TYLENOL) oral liquid 160 mg/5 mL  1,000 mg Per Tube Q6H   acetaminophen (TYLENOL) oral liquid 160 mg/5 mL  650 mg Per Tube Once   Or   acetaminophen  650 mg Rectal Once   allopurinol  100 mg Oral QPM   aspirin EC  325 mg Oral Daily   Or   aspirin  324 mg Per Tube Daily   bisacodyl  10 mg Oral Daily   Or   bisacodyl  10 mg Rectal Daily   calcium acetate  2,001 mg Oral TID WC   chlorhexidine  15 mL Mouth Rinse BID   Chlorhexidine Gluconate Cloth  6 each Topical Daily   cholecalciferol  1,000 Units Oral QPM   cinacalcet  30 mg Oral Q T,Th,Sa-HD   darbepoetin (ARANESP) injection - DIALYSIS  100 mcg Intravenous Q Fri-HD   docusate sodium  200 mg Oral Daily   enoxaparin (LOVENOX) injection  30 mg Subcutaneous Q24H    ferrous sulfate  325 mg Oral Once per day on Mon Wed Fri   fluticasone  1 spray Each Nare Daily   folic acid  1 mg Oral Daily   insulin aspart  0-24 Units Subcutaneous Q4H   mouth rinse  15 mL Mouth Rinse q12n4p   metoprolol tartrate  12.5 mg Oral BID   Or   metoprolol tartrate  12.5 mg Per Tube BID   montelukast  10 mg Oral Q supper   nystatin  5 mL Oral QID   pantoprazole  40 mg Oral Daily   polyethylene glycol  17 g Oral Daily   polyvinyl alcohol  1 drop Both Eyes TID AC & HS   predniSONE  5 mg Oral Q breakfast   saccharomyces boulardii  250 mg Oral Daily   sodium chloride flush  3 mL Intravenous Q12H   umeclidinium bromide  1 puff Inhalation Daily   Claudia Desanctis, MD 09/18/2020, 6:32 AM

## 2020-09-18 NOTE — Progress Notes (Signed)
Patient pulled BiPAP mask off, refused to be placed back on. Pt placed on [place] and satting >95%. Notified respiratory.

## 2020-09-18 NOTE — Progress Notes (Signed)
EVENING ROUNDS NOTE :     [address]       [place] [address]             [phone]                 5 Days Post-Op Procedure(s) (LRB): CORONARY ARTERY BYPASS GRAFTING (CABG), ON PUMP, TIMES TWO, USING LEFT INTERNAL MAMMARY ARTERY AND RIGHT ENDOSCOPICALLY HARVESTED GREATER SAPHENOUS VEIN (N/A) TRANSESOPHAGEAL ECHOCARDIOGRAM (TEE) (N/A)   Total Length of Stay:  LOS: 10 days  Events:   No events Continues to improve    BP (!) 169/74   Pulse 72   Temp 98.8 F (37.1 C) (Oral)   Resp 11   Ht '5\' 2"'$  (1.575 m)   Wt 65.1 kg   SpO2 97%   BMI 26.25 kg/m      FiO2 (%):  [30 %] 30 %   sodium chloride Stopped (09/14/20 1059)   sodium chloride 250 mL (09/14/20 0509)   sodium chloride 10 mL/hr at 09/16/20 1610   sodium chloride     sodium chloride     dextrose 5 % and 0.9% NaCl 10 mL/hr at 09/18/20 1600   lactated ringers     lactated ringers      I/O last 3 completed shifts: In: 1274.7 [I.V.:1165.2; IV Piggyback:109.5] Out: 2000 [Other:2000]   CBC Latest Ref Rng & Units 09/18/2020 09/17/2020 09/17/2020  WBC 4.0 - 10.5 K/uL 7.0 - 8.7  Hemoglobin 12.0 - 15.0 g/dL 9.0(L) 11.2(L) 9.8(L)  Hematocrit 36.0 - 46.0 % 26.5(L) 33.0(L) 30.0(L)  Platelets 150 - 400 K/uL 96(L) - 114(L)    BMP Latest Ref Rng & Units 09/18/2020 09/17/2020 09/17/2020  Glucose 70 - 99 mg/dL 106(H) - 106(H)  BUN 6 - 20 mg/dL 19 - 31(H)  Creatinine 0.44 - 1.00 mg/dL 4.01(H) - 5.41(H)  Sodium 135 - 145 mmol/L 133(L) 134(L) 131(L)  Potassium 3.5 - 5.1 mmol/L 3.4(L) 2.9(L) 4.1  Chloride 98 - 111 mmol/L 96(L) - 97(L)  CO2 22 - 32 mmol/L 29 - 23  Calcium 8.9 - 10.3 mg/dL 9.2 - 9.2    ABG    Component Value Date/Time   PHART 7.439 09/17/2020 1014   PCO2ART 43.7 09/17/2020 1014   PO2ART 60 (L) 09/17/2020 1014   HCO3 29.6 (H) 09/17/2020 1014   TCO2 31 09/17/2020 1014   ACIDBASEDEF 1.0 09/13/2020 2152   O2SAT 91.0 09/17/2020 Row[place], MD 09/18/2020 4:12 PM

## 2020-09-19 LAB — RENAL FUNCTION PANEL
Albumin: 2 g/dL — ABNORMAL LOW (ref 3.5–5.0)
Anion gap: 10 (ref 5–15)
BUN: 27 mg/dL — ABNORMAL HIGH (ref 6–20)
CO2: 26 mmol/L (ref 22–32)
Calcium: 9.8 mg/dL (ref 8.9–10.3)
Chloride: 98 mmol/L (ref 98–111)
Creatinine, Ser: 5.34 mg/dL — ABNORMAL HIGH (ref 0.44–1.00)
GFR, Estimated: 9 mL/min — ABNORMAL LOW (ref >60)
Glucose, Bld: 100 mg/dL — ABNORMAL HIGH (ref 70–99)
Phosphorus: 1.9 mg/dL — ABNORMAL LOW (ref 2.5–4.6)
Potassium: 3.8 mmol/L (ref 3.5–5.1)
Sodium: 134 mmol/L — ABNORMAL LOW (ref 135–145)

## 2020-09-19 LAB — CBC
HCT: 27.4 % — ABNORMAL LOW (ref 36.0–46.0)
Hemoglobin: 9.3 g/dL — ABNORMAL LOW (ref 12.0–15.0)
MCH: 29.5 pg (ref 26.0–34.0)
MCHC: 33.9 g/dL (ref 30.0–36.0)
MCV: 87 fL (ref 80.0–100.0)
Platelets: 114 10*3/uL — ABNORMAL LOW (ref 150–400)
RBC: 3.15 MIL/uL — ABNORMAL LOW (ref 3.87–5.11)
RDW: 23.7 % — ABNORMAL HIGH (ref 11.5–15.5)
WBC: 9.7 10*3/uL (ref 4.0–10.5)
nRBC: 0 % (ref 0.0–0.2)

## 2020-09-19 LAB — GLUCOSE, CAPILLARY
Glucose-Capillary: 143 mg/dL — ABNORMAL HIGH (ref 70–99)
Glucose-Capillary: 55 mg/dL — ABNORMAL LOW (ref 70–99)
Glucose-Capillary: 244 mg/dL — ABNORMAL HIGH (ref 70–99)
Glucose-Capillary: 76 mg/dL (ref 70–99)
Glucose-Capillary: 100 mg/dL — ABNORMAL HIGH (ref 70–99)
Glucose-Capillary: 110 mg/dL — ABNORMAL HIGH (ref 70–99)
Glucose-Capillary: 92 mg/dL (ref 70–99)

## 2020-09-19 MED ORDER — DARBEPOETIN ALFA 100 MCG/0.5ML IJ SOSY
PREFILLED_SYRINGE | INTRAMUSCULAR | Status: AC
  Filled 2020-09-19: qty 0.5

## 2020-09-19 MED ORDER — DARBEPOETIN ALFA 100 MCG/0.5ML IJ SOSY
100.0000 ug | PREFILLED_SYRINGE | INTRAMUSCULAR | Status: DC
  Administered 2020-09-19 – 2020-09-26 (×2): 100 ug via INTRAVENOUS
  Filled 2020-09-19 (×3): qty 0.5

## 2020-09-19 MED ORDER — LABETALOL HCL 200 MG PO TABS
200.0000 mg | ORAL_TABLET | Freq: Three times a day (TID) | ORAL | Status: DC
  Administered 2020-09-19 (×3): 200 mg via ORAL
  Filled 2020-09-19 (×3): qty 1

## 2020-09-19 MED ORDER — SODIUM CHLORIDE 0.9% FLUSH
3.0000 mL | Freq: Two times a day (BID) | INTRAVENOUS | Status: DC
  Administered 2020-09-19 – 2020-09-23 (×7): 3 mL via INTRAVENOUS

## 2020-09-19 MED ORDER — SODIUM CHLORIDE 0.9% FLUSH: 3.0000 mL | INTRAVENOUS | Status: DC | PRN

## 2020-09-19 MED ORDER — ONDANSETRON HCL 4 MG/2ML IJ SOLN
4.0000 mg | Freq: Four times a day (QID) | INTRAMUSCULAR | Status: DC | PRN
  Administered 2020-09-19 – 2020-10-04 (×8): 4 mg via INTRAVENOUS
  Filled 2020-09-19 (×6): qty 2

## 2020-09-19 MED ORDER — SODIUM CHLORIDE 0.9 % IV SOLN: 250.0000 mL | INTRAVENOUS | Status: DC | PRN

## 2020-09-19 MED ORDER — ONDANSETRON HCL 4 MG/2ML IJ SOLN
INTRAMUSCULAR | Status: AC
  Filled 2020-09-19: qty 2

## 2020-09-19 MED ORDER — NEPRO/CARBSTEADY PO LIQD
237.0000 mL | Freq: Two times a day (BID) | ORAL | Status: DC
Start: 2020-09-19 — End: 2020-09-20
  Administered 2020-09-19 – 2020-09-20 (×2): 237 mL via ORAL
  Filled 2020-09-19: qty 237

## 2020-09-19 MED ORDER — ~~LOC~~ CARDIAC SURGERY, PATIENT & FAMILY EDUCATION: Freq: Once | Status: AC

## 2020-09-19 MED ORDER — HYDRALAZINE HCL 20 MG/ML IJ SOLN
10.0000 mg | Freq: Once | INTRAMUSCULAR | Status: AC
  Administered 2020-09-19: 10 mg via INTRAVENOUS

## 2020-09-19 MED ORDER — CHLORHEXIDINE GLUCONATE CLOTH 2 % EX PADS
6.0000 | MEDICATED_PAD | Freq: Every day | CUTANEOUS | Status: DC
Start: 2020-09-20 — End: 2020-09-24
  Administered 2020-09-20 – 2020-09-23 (×2): 6 via TOPICAL

## 2020-09-19 MED ORDER — HYDRALAZINE HCL 20 MG/ML IJ SOLN
INTRAMUSCULAR | Status: AC
  Administered 2020-09-19: 10 mg
  Filled 2020-09-19: qty 1

## 2020-09-19 NOTE — Discharge Summary (Signed)
[address]       [place] [address]             [phone]    Physician Discharge Summary  Patient ID: Jodi Bell MRN: [id number] DOB/AGE: [birth year] 60 y.o.  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/13/2020   Non-ST elevation (NSTEMI) myocardial infarction [facility])    Pulmonary edema 09/08/2020   CHF (congestive heart failure) ([place]) 09/08/2020   Hypertensive urgency    Dyspnea 07/03/2020   GI bleeding 05/17/2019   Volume overload state of heart 09/14/2018   Intervertebral cervical disc disorder with myelopathy, cervical region 09/14/2018   OSA (obstructive sleep apnea) 09/14/2018   Asthma 05/16/2018   Suspected COVID-19 virus infection 05/16/2018   ESRD (end stage renal disease) ([place]) 05/16/2018   Fluid overload 02/27/2018   Chest pain, rule out acute myocardial infarction 12/12/2017   Status post left partial knee replacement 11/12/2017   Hematochezia 09/20/2017   GERD (gastroesophageal reflux disease) 09/20/2017   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 2 diabetes mellitus ([place]) 09/20/2017   COPD (chronic obstructive pulmonary disease) ([place]) 09/20/2017   Tobacco use 09/20/2017   Symptomatic anemia 09/20/2017   Headache 09/20/2017   Serotonin syndrome 09/28/2013   Acute respiratory failure with hypoxia ([place]) 09/28/2013   Encephalopathy acute 09/28/2013   Hypotension, unspecified 09/28/2013   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   Type 2 diabetes mellitus ([place]) 03/06/2008   INSOMNIA, CHRONIC 03/06/2008   Essential hypertension 03/06/2008   ASTHMA 03/06/2008   ECZEMA 03/06/2008   BACK PAIN, LUMBAR, CHRONIC 03/06/2008     Discharge Diagnoses:  Patient Active Problem List   Diagnosis Date Noted   Pressure injury of skin 09/21/2020   Malnutrition of moderate degree  09/20/2020   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/13/2020   Non-ST elevation (NSTEMI) myocardial infarction [facility])    Pulmonary edema 09/08/2020   CHF (congestive heart failure) ([place]) 09/08/2020   Hypertensive urgency    Dyspnea 07/03/2020   GI bleeding 05/17/2019   Volume overload state of heart 09/14/2018   Intervertebral cervical disc disorder with myelopathy, cervical region 09/14/2018   OSA (obstructive sleep apnea) 09/14/2018   Asthma 05/16/2018   Suspected COVID-19 virus infection 05/16/2018   ESRD (end stage renal disease) ([place]) 05/16/2018   Fluid overload 02/27/2018   Chest pain, rule out acute myocardial infarction 12/12/2017   Status post left partial knee replacement 11/12/2017   Hematochezia 09/20/2017   GERD (gastroesophageal reflux disease) 09/20/2017   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 2 diabetes mellitus ([place]) 09/20/2017   COPD (chronic obstructive pulmonary disease) ([place]) 09/20/2017   Tobacco use 09/20/2017   Symptomatic anemia 09/20/2017   Headache 09/20/2017   Serotonin syndrome 09/28/2013   Acute respiratory failure with hypoxia ([place]) 09/28/2013   Encephalopathy acute 09/28/2013   Hypotension, unspecified 09/28/2013   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 In the ED, she was found to be in severe respiratory distress, hypertensive with SBP in the 180-190's, and tachypneic. She was placed on supplemental oxygen. Chest x ray done showed stable  cardiomegaly, persistent but improving pulmonary edema, persistent small left pleural effusion, and atelectasis. CT of abdomen and pelvis showed bilateral pleural effusions with atelectasis, cardiomegaly, coronary artery disease, gallbladder wall thickening and/or pericholecystic fluid, calcified atherosclerosis in the abdominal aorta, and erosion of the superior endplate of L3 and the inferior endplate of L2 with air in the disc space (discitis/osteomyelitis cannot be excluded). Initial Troponin I (high sensitivity) was 40 and max is 1,499. She ruled in for a NSTEMI. She was started on IV Heparin. Of note, CBC showed H and H to be 9.2 and 28.1.Patient underwent a cardiac catheterization on 09/10/2020 that showed 80% stenosis of the proximal and mid LAD as well as up to 90% stenosis of the mid RCA. Echo done 09/09/2020 showed LVEF 60%, small pericardial effusion, mild AS, mild MR, and severe mitral annular calcification. Of note, echo done May 2022 showed mild to moderate MR and MS, and mild AS. Dr. Cyndia Bent has been consulted for consideration of coronary artery bypass grafting surgery.  The patient and all relevant studies were evaluated by Dr. Cyndia Bent recommended proceeding with CABG as best surgical revascularization option at this time.   Hospital course:  The patient was medically stabilized on the hospitalist service with the assistance of cardiology and nephrology for hemodialysis management.  She was felt to be stable to proceed with surgery and on 09/13/2020 taken the operating room at which time she underwent CABG x2.  She tolerated procedure well and was taken to the surgical intensive care unit in stable condition.  Postoperative hospital course:  On postoperative day 1 the patient was hemodynamically stable in sinus rhythm.  She was on Cardene for hypertension and blood pressure was monitored closly.  Dialysis management was again reinitiated by the nephrology service.  She was extubated without  difficulty using standard protocols.  Her Swan-Ganz and arterial line were removed on postoperative 1.  Chest tubes were removed as well.  She has an expected acute blood loss anemia which is stabilized.  On postoperative day 2 the patient had a event concerning for seizure activity and possible CVA.  A code stroke was called and CT scan was negative for acute finding.  She was noted to have some mental status changes.  The seizure activity was described as like spasms in her legs as she also had right arm flexion and head turn to the right and did become unresponsive.  The activity lasted approximately 1 minute and following this she remained confused and there was a question of left-sided weakness.  A stat EEG showed no definite seizure activity and it was subsequently followed by video prolonged EEG.  She was started on Keppra and as well as loaded with phenytoin.  She has had no repeat events and antiseizure medications have subsequently been discontinued.  Her tramadol was also discontinued.  The video EEG showed no evidence of seizure activity and findings were consistent with moderate diffuse encephalopathy of nonspecific etiology.  She continues to be managed from a nephrology viewpoint with hemodialysis managed by the renal service.  During dialysis on 09/20/2018.  She has difficulty with elevated heart rate as well as decreased blood pressure requiring stopping the procedure  after 2 hours.  She stabilized quickly and returned to the ICU.  She has a history of CKD and HD was arranged by Nephrology. She developed edema in the LUE and breast. Scan was negative for DVT and it was felt that swelling was due to LUE AV fistula with venous hypertension. Overnight she developed some dark-colored stool as well as streaks of fresh blood as well as nausea and vomiting.  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  Her hemoglobin hematocrit dropped to  8.3 and 23.8 respectively.  Gastroenterology consultation was obtained and she has subsequently been started on a Protonix drip as well as Anusol suppositories.  If she continues to have bleeding she may require a bleeding scan or CTA for further evaluation as she is not felt to be a strong enough candidate to undergo colonoscopy at this time.  She was transfused with 2 units of packed red cells on 09/20/2020.  This resulted in only a 0.5% increase in her hematocrit.  She continued to have some dark stools with red streaks over the course of the day.  Dr. Alessandra Bevels recommended nuclear medicine bleeding scan which was performed and proved to be negative.  He further recommended proceeding with endoscopy and possibly sigmoidoscopy on 09/21/2020 but the family initially declined these procedures.  The Protonix drip was continued.  She was permitted to have a full liquid diet. Because of further bloody stools, patient and family agreeable to upper GI endoscopy on 09/22/2020. 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 She had post op a fib, but was maintaining SR on Amiodarone. She was felt surgically stable for transfer from the ICU to 4E on 08/16. Blood pressure was well controlled as of 08/18 on Clonidine 0.1 mg bid, Hydralazine 100 mg tid, Irbesartan 300 mg daily, Imdur 30 mg daily, and Labetalol 200 mg tid. She had thrombocytopenia post op. Platelets went down to 72,000 but then gradually increased. She also had anemic of chronic disease.  She had nausea and emesis on 08/19. Zofran was continue and Reglan was given. She did have a repeat GI bleed with bright red blood and GI was re-consulted on 8/21.  She underwent both EGD and colonoscopy on 10/03/2018 for which were negative for active bleeding.  Colonoscopy did show inflamed and eroded internal hemorrhoids which could be the source of bleeding.  She is also noted to have diffuse  colonic diverticuli but no signs of bleeding.  She has been quite stable from a CV surgical perspective remaining in sinus rhythm with some systolic hypertension at times, but mostly blood pressure is well controlled.  She has some moderate malnutrition in the context of acute on chronic disease and is receiving supplements.  She is overall progressing well in terms of her physical rehabilitation, albeit slow due to deconditioning and multiple comorbidities.  She has had no further GI bleeding in over 48 hours and hemoglobin hematocrit are felt to be stable, although decreased at 7.0 and 21.9.  She is on Aranesp through the nephrology service and iron studies have been obtained.  She will require continued ongoing outpatient management of her anemia but is felt to be stable and not require further transfusion at this time.  Overall from a medical perspective she is felt to be stable for discharge on today's date 10/04/20.  Consults: GI and nephrology, Neurology, CCM/pulmonary  Significant Diagnostic Studies:  Narrative & Impression  CLINICAL DATA:  Concern for clot related to left internal jugular central venous catheter.   Ten days post CABG.   EXAM: CT CHEST WITH CONTRAST   TECHNIQUE: Multidetector CT imaging of the chest was performed during intravenous contrast administration.   CONTRAST:  132m OMNIPAQUE IOHEXOL 350 MG/ML SOLN   COMPARISON:  Radiograph 09/20/2020.   FINDINGS: Cardiovascular: There is a left internal jugular central line in place, the tip is in the upper SVC. There is no evidence of clot or thrombus along the course of the catheter. There is contrast mixing in the SVC and azygos vein but no evidence of SVC clot. The included left subclavian vein is patent. Dense contrast opacifies the right axillary and subclavian veins which are not well assessed. No evidence of SVC thrombus multi chamber cardiomegaly. Post CABG with calcification of native coronary arteries.  Expected pericardial fluid for recent postop with few foci of gas. No evidence of mediastinal collection. Aortic tortuosity. Mild aortic atherosclerosis. The aortic branch vessels are patent. Left axillary arteries are tortuous, patient has left upper extremity AV fistula.   Mediastinum/Nodes: No mediastinal hematoma or fluid collection. Scattered mediastinal lymph nodes are not enlarged by size criteria. No hilar adenopathy. Patulous esophagus. No esophageal wall thickening.   Lungs/Pleura: Small to moderate left and small right pleural effusion. Associated compressive atelectasis in the lower lobes. There are faint peripheral ground-glass opacities in the upper lobes, series 4, image 49 minimal peripheral ground-glass opacity in the right middle lobe, series 4 image 89. No septal thickening or findings of pulmonary edema. The trachea and central bronchi are patent.   Upper Abdomen: Ingested material within the stomach. Possible gastric surgical clips. Nonspecific gallbladder wall thickening.   Musculoskeletal: Post median sternotomy. Surgical hardware in the lumbar spine is partially included. Stable appearance of disc irregularity at L2-L3 which was previously evaluated with MRI. No subcutaneous fluid collection. Sequela of renal osteodystrophy. There is generalized subcutaneous edema. Asymmetric left breast edema.   IMPRESSION: 1. Left internal jugular central line in place, the tip is in the upper SVC. There is no evidence of clot or thrombus along the course of the catheter. There is contrast mixing in the SVC and azygos vein but no evidence of SVC thrombus. Left subclavian vein is widely patent. 2. Small to moderate left and small right pleural effusions with compressive atelectasis. 3. Faint peripheral ground-glass opacities in the upper lobes and right middle lobe, may be infectious or inflammatory, including COVID 19 pneumonia. 4. Asymmetric left breast soft tissue  edema. 5. Recent CABG. 6. Nonspecific gallbladder wall thickening in the upper abdomen.   Aortic Atherosclerosis (ICD10-I70.0).     CLINICAL DATA:  Central line placement   EXAM: PORTABLE CHEST 1 VIEW   COMPARISON:  09/20/2020 at 1706 hours   FINDINGS: Left IJ venous catheter terminates in the mid SVC.   Tiny right apical pneumothorax, unchanged. Small left pleural effusion.   Increased interstitial markings, suggesting very mild interstitial edema. Lingular atelectasis.   Cardiomegaly.  Postsurgical changes related to prior CABG.   Median sternotomy.   IMPRESSION: Cardiomegaly with mild interstitial edema and small left pleural effusion.   Tiny right apical pneumothorax, unchanged.   Left IJ venous catheter terminates in the mid SVC.     Electronically Signed   By: SJulian HyM.D.   On: 09/21/2020 00:00    Treatments: surgery:  Median Sternotomy Extracorporeal circulation 3.   Coronary artery bypass grafting x  2   Left internal mammary artery graft to the LAD SVG to PDA     4.   Endoscopic vein harvest from the right leg by Dr. Cyndia Bent on 09/13/2020.  Upper GI endoscopy with intervention by Dr. Paulita Fujita on 09/22/2020.    Discharge Exam: Blood pressure (!) 145/69, pulse 81, temperature 98.8 F (37.1 C), temperature source Oral, resp. rate 20, height '5\' 2"'$  (1.575 m), weight 65.2 kg, SpO2 100 %.  General appearance: alert, cooperative, and no distress Heart: regular rate and rhythm Lungs: min dim left base, overall clear Abdomen: benign exam Extremities: LUE edema slightly improved, no LE edema Wound: healing well without signs of infection  Discharge Medications:  The patient has been discharged on:   1.Beta Blocker:  Yes [ x  ]                              No   [   ]                              If No, reason:  2.Ace Inhibitor/ARB: Yes [   ]                                     No  [   n ]                                     If No,  reason:allergy  3.Statin:   Yes [   ]                  No  [  x ]                  If No, reason:Allergy  4.[name]:  Yes  [   ]                  No   [   x]                  If No, reason:  Patient had ACS upon admission:YES  Plavix/P2Y12 inhibitor: Yes [   ]                                      No  [  x ]Bleeding- GI     Discharge Instructions     Amb Referral to Cardiac Rehabilitation   Complete by: As directed    Referring to [place] CRP 2   Diagnosis:  CABG NSTEMI     CABG X ___: 2   After initial evaluation and assessments completed: Virtual Based Care may be provided alone or in conjunction with Phase 2 Cardiac Rehab based on patient barriers.: Yes   Discharge patient   Complete by: As directed    Discharge disposition: 01-Home or Self Care   Discharge patient date: 10/04/2020      Allergies as of 10/04/2020       Reactions   3-methyl-2-benzothiazolinone Hydrazone Other (See Comments)   Muscle weakness muscle cramping in legs Other reaction(s): Myalgias (Muscle Pain)   Banana Anaphylaxis, Swelling   TONGUE AND MOUTH  SWELLING   Black Walnut Flavor Anaphylaxis   Walnuts   Hazelnut (filbert) Allergy Skin Test Anaphylaxis   Hazelnuts   Leflunomide And Related Other (See Comments)   Severe Headache   Lisinopril Swelling   Angioedema  Swelling of the face   No Healthtouch Food Allergies Anaphylaxis   Spicy Mustard 4.7.2021 Pt reports that she eats Regular Yellow Mustard   Other Other (See Comments), Anaphylaxis, Swelling   Cosentyx= angioedema Other reaction(s): Facial Edema (intolerance) Mouth itches and tongue swelling Hazel nuts and pecans also Walnuts Walnuts Renal failure Other reaction(s): Other Serotonin Syndrome  Serotonin syndrome  Tremors Other reaction(s): Other Tremors Muscle weakness muscle cramping in legs Other reaction(s): Myalgias (Muscle Pain) Mouth itches and tongue swelling Hazel nuts and pecans also Walnuts   Pecan  Extract Allergy Skin Test Anaphylaxis   Pecans   Pecan Nut (diagnostic) Anaphylaxis   Pecans   Trazodone And Nefazodone Other (See Comments)   Serotonin Syndrome  Tremors   Adalimumab Other (See Comments)   Blisters on abdomen =humira   Escitalopram Oxalate Other (See Comments)   Hand problems - Serotonin Syndrome    Ezetimibe Other (See Comments)   myalgias cramps   Secukinumab Swelling   Cosentyx = angiodema   Statins Other (See Comments)   Leg pains and weakness   Ferrlecit [na Ferric Gluc Cplx In Sucrose]    Not reaction given from HD   Ibuprofen Other (See Comments)   Renal Problems        Medication List     STOP taking these medications    calcium acetate 667 MG capsule Commonly known as: PHOSLO   cetirizine 10 MG tablet Commonly known as: ZYRTEC   cinacalcet 30 MG tablet Commonly known as: SENSIPAR   cyclobenzaprine 10 MG tablet Commonly known as: FLEXERIL   ELDERBERRY PO   ferrous sulfate 325 (65 FE) MG tablet   furosemide 40 MG tablet Commonly known as: LASIX   Januvia 25 MG tablet Generic drug: sitaGLIPtin   nitroGLYCERIN 0.4 MG SL tablet Commonly known as: NITROSTAT   Xeljanz 5 MG Tabs Generic drug: Tofacitinib Citrate   zolpidem 5 MG tablet Commonly known as: AMBIEN       TAKE these medications    acetaminophen 500 MG tablet Commonly known as: TYLENOL Take 1,000 mg by mouth every 6 (six) hours as needed for mild pain.   allopurinol 100 MG tablet Commonly known as: ZYLOPRIM Take 100 mg by mouth every evening.   amiodarone 200 MG tablet Commonly known as: PACERONE Take 1 tablet (200 mg total) by mouth daily.   amLODipine 10 MG tablet Commonly known as: NORVASC Take 10 mg by mouth daily.   carboxymethylcellulose 1 % ophthalmic solution Place 1 drop into both eyes 4 (four) times daily.   cloNIDine 0.1 MG tablet Commonly known as: CATAPRES Take 1 tablet (0.1 mg total) by mouth 2 (two) times daily. What changed:   medication strength how much to take when to take this   Darbepoetin Alfa 200 MCG/0.4ML Sosy injection Commonly known as: ARANESP Inject 0.4 mLs (200 mcg total) into the vein every Wednesday with hemodialysis. Start taking on: October 10, 2020   EPINEPHrine 0.3 mg/0.3 mL Soaj injection Commonly known as: EpiPen Inject 0.3 mLs (0.3 mg total) into the muscle once. What changed:  when to take this reasons to take this   feeding supplement Liqd Take 237 mLs by mouth 3 (three) times daily between meals.   fluticasone 50 MCG/ACT nasal spray  Commonly known as: FLONASE Place 1 spray into both nostrils daily.   folic acid 1 MG tablet Commonly known as: FOLVITE Take 1 tablet (1 mg total) by mouth daily. What changed:  medication strength how much to take   gabapentin 300 MG capsule Commonly known as: Neurontin Take 1 capsule (300 mg total) by mouth at bedtime as needed (Neuropathic pain). What changed:  when to take this reasons to take this   heparin 1000 unit/mL Soln injection 3 mLs (3,000 Units total) by Dialysis route as needed (in dialysis as needed).   heparin 1000 unit/mL Soln injection 2 mLs (2,000 Units total) by Dialysis route as needed (in dialysis as needed).   heparin 1000 unit/mL Soln injection 1 mL (1,000 Units total) by Dialysis route as needed (in dialysis).   hydrALAZINE 100 MG tablet Commonly known as: APRESOLINE Take 1 tablet (100 mg total) by mouth every 8 (eight) hours.   hydrocortisone 25 MG suppository Commonly known as: ANUSOL-HC Place 1 suppository (25 mg total) rectally 2 (two) times daily.   isosorbide mononitrate 30 MG 24 hr tablet Commonly known as: IMDUR Take 1 tablet (30 mg total) by mouth daily. What changed:  medication strength how much to take   labetalol 200 MG tablet Commonly known as: NORMODYNE Take 1 tablet (200 mg total) by mouth 3 (three) times daily. What changed: how much to take   lidocaine-prilocaine  cream Commonly known as: EMLA Apply 1 application topically as needed (topical anesthesia for hemodialysis if Gebauers and Lidocaine injection are ineffective.).   LORazepam 1 MG tablet Commonly known as: ATIVAN Take 1 mg by mouth every 8 (eight) hours as needed for anxiety.   montelukast 10 MG tablet Commonly known as: SINGULAIR Take 10 mg by mouth daily with supper.   multivitamin Tabs tablet Take 1 tablet by mouth daily with supper.   olmesartan 40 MG tablet Commonly known as: BENICAR Take 40 mg by mouth every evening.   oxyCODONE 5 MG immediate release tablet Commonly known as: Oxy IR/ROXICODONE Take 1 tablet (5 mg total) by mouth every 6 (six) hours as needed for up to 7 days for severe pain. What changed:  medication strength how much to take when to take this reasons to take this   pantoprazole 40 MG tablet Commonly known as: PROTONIX Take 40 mg by mouth daily.   pentafluoroprop-tetrafluoroeth Aero Commonly known as: GEBAUERS Apply 1 application topically as needed (topical anesthesia for hemodialysis).   predniSONE 5 MG tablet Commonly known as: DELTASONE Take 1 tablet (5 mg total) by mouth daily with breakfast.   PROBIOTIC PO Take 1 capsule by mouth daily.   sorbitol 70 % Soln Take 30 mLs by mouth daily as needed for moderate constipation.   SPIRIVA RESPIMAT IN Inhale 2 puffs into the lungs daily.   Ventolin HFA 108 (90 Base) MCG/ACT inhaler Generic drug: albuterol Inhale 1-2 puffs into the lungs every 6 (six) hours as needed for shortness of breath. wheezing What changed: Another medication with the same name was removed. Continue taking this medication, and follow the directions you see here.   Vitamin D3 25 MCG tablet Commonly known as: Vitamin D Take 1 tablet (1,000 Units total) by mouth every evening. What changed:  medication strength how much to take               Durable Medical Equipment  (From admission, onward)            Start     Ordered  10/02/20 1402  For home use only DME 4 wheeled rolling walker with seat  Once       Question:  Patient needs a walker to treat with the following condition  Answer:  Weakness   10/02/20 1403            Follow-up Information     Gaye Pollack, MD Follow up.   Specialty: Cardiothoracic Surgery Why: Please see discharge paperwork for follow-up appointment with surgeon.  On the date you see surgeon obtain a chest x-ray at [facility] 1/2-hour prior to appointment.  An appointment will also be needed for suture removal.  This will be with the office nurse. Contact information: [address] [place] [address] [phone]         Nelva Bush, MD Follow up.   Specialty: Cardiology Why: Please see discharge paperwork for follow-up appointment with cardiology.  Also obtain an [facility] account to keep track of your future appointments. Contact information: [address] [place] [address] [phone]         Nelva Bush, MD .   Specialty: Cardiology Contact information: [place] [address] [phone]         Golda Acre, [facility] Follow up.   Specialty: [place] Why: for home health services Contact information: [place] [address] [phone]                 Signed: Odis Luster  10/04/2020, 8:46 AM

## 2020-09-19 NOTE — Progress Notes (Signed)
      [address]       [place] [address]             [phone]      Had HD today, cut short due to tachycardia  Mental status waxes and wanes, better after HD  BP 114/68 (BP Location: Right Arm)   Pulse 95   Temp 98.3 F (36.8 C) (Oral)   Resp 19   Ht '5\' 2"'$  (1.575 m)   Wt 65.1 kg   SpO2 100%   BMI 26.25 kg/m   Intake/Output Summary (Last 24 hours) at 09/19/2020 1818 Last data filed at 09/19/2020 1000 Gross per 24 hour  Intake 255.78 ml  Output --  Net 255.78 ml   Poor PO intake  Remo Lipps C. Roxan Hockey, MD [facility] [phone]

## 2020-09-19 NOTE — Procedures (Signed)
Patient seen on dialysis.  Initially tolerated treatment well.  Then called to bedside as HR up to 130's.  Stopped UF then rinsed back after approximately 2 hours of tx.  See nursing note.  HR improved to 107, BP 145/94.  I spoke with her team, PA Gold, to notify.   Claudia Desanctis, MD 09/19/2020 5:09 PM

## 2020-09-19 NOTE — Progress Notes (Addendum)
[facility] DAILY ICU PROGRESS NOTE                   [address]            [place] [address]          [phone]   6 Days Post-Op Procedure(s) (LRB): CORONARY ARTERY BYPASS GRAFTING (CABG), ON PUMP, TIMES TWO, USING LEFT INTERNAL MAMMARY ARTERY AND RIGHT ENDOSCOPICALLY HARVESTED GREATER SAPHENOUS VEIN (N/A) TRANSESOPHAGEAL ECHOCARDIOGRAM (TEE) (N/A)  Total Length of Stay:  LOS: 11 days   Subjective: Vomited last pm, no abdominal pain, + BM's, eating some of meals  Objective: Vital signs in last 24 hours: Temp:  [98.2 F (36.8 C)-99 F (37.2 C)] 99 F (37.2 C) (08/10 0330) Pulse Rate:  [66-78] 77 (08/10 0500) Cardiac Rhythm: Normal sinus rhythm (08/10 0400) Resp:  [11-22] 15 (08/10 0500) BP: (156-207)/(63-85) 186/82 (08/10 0500) SpO2:  [94 %-100 %] 97 % (08/10 0500) FiO2 (%):  [30 %] 30 % (08/09 2153)  Filed Weights   09/17/20 0730 09/17/20 1100 09/18/20 0500  Weight: 67.8 kg 65.6 kg 65.1 kg    Weight change:    Hemodynamic parameters for last 24 hours:    Intake/Output from previous day: 08/09 0701 - 08/10 0700 In: 466.4 [P.O.:220; I.V.:246.4] Out: -   Intake/Output this shift: No intake/output data recorded.  Current Meds: Scheduled Meds:  sodium chloride   Intravenous Once   allopurinol  100 mg Oral QPM   amiodarone  200 mg Oral BID   amLODipine  10 mg Oral Daily   aspirin EC  325 mg Oral Daily   Or   aspirin  324 mg Per Tube Daily   bisacodyl  10 mg Oral Daily   Or   bisacodyl  10 mg Rectal Daily   calcium acetate  2,001 mg Oral TID WC   chlorhexidine  15 mL Mouth Rinse BID   Chlorhexidine Gluconate Cloth  6 each Topical Daily   Chlorhexidine Gluconate Cloth  6 each Topical Q0600   cholecalciferol  1,000 Units Oral QPM   cinacalcet  30 mg Oral Q T,Th,Sa-HD   darbepoetin (ARANESP) injection - DIALYSIS  100 mcg Intravenous Q Wed-HD   docusate sodium  200 mg Oral Daily   enoxaparin (LOVENOX) injection  30 mg Subcutaneous Q24H    ferrous sulfate  325 mg Oral Once per day on Mon Wed Fri   fluticasone  1 spray Each Nare Daily   folic acid  1 mg Oral Daily   insulin aspart  0-24 Units Subcutaneous Q4H   irbesartan  150 mg Oral Daily   labetalol  200 mg Oral TID   magnesium oxide  200 mg Oral Daily   mouth rinse  15 mL Mouth Rinse q12n4p   montelukast  10 mg Oral Q supper   nystatin  5 mL Oral QID   pantoprazole  40 mg Oral BID   polyethylene glycol  17 g Oral Daily   polyvinyl alcohol  1 drop Both Eyes TID AC & HS   predniSONE  5 mg Oral Q breakfast   saccharomyces boulardii  250 mg Oral Daily   sodium chloride flush  3 mL Intravenous Q12H   umeclidinium bromide  1 puff Inhalation Daily   Continuous Infusions:  sodium chloride Stopped (09/14/20 1059)   sodium chloride 250 mL (09/14/20 0509)   sodium chloride 10 mL/hr at 09/16/20 1610   sodium chloride     sodium chloride  dextrose 5 % and 0.9% NaCl 10 mL/hr at 09/19/20 0457   lactated ringers     lactated ringers     PRN Meds:.sodium chloride, sodium chloride, sodium chloride, albuterol, alteplase, dextrose, fentaNYL (SUBLIMAZE) injection, food thickener, gabapentin, heparin, heparin, heparin, lidocaine (PF), lidocaine-prilocaine, metoprolol tartrate, ondansetron (ZOFRAN) IV, oxyCODONE, pentafluoroprop-tetrafluoroeth, sodium chloride flush  General appearance: cooperative, distracted, fatigued, and no distress Heart: regular rate and rhythm and + systolic murmur Lungs: dim left>right base Abdomen: mild distension. Scant BS, non tender, no guarding/rebound Extremities: no edema Wound: incis healing well  Lab Results: CBC: Recent Labs    09/18/20 0312 09/19/20 0444  WBC 7.0 9.7  HGB 9.0* 9.3*  HCT 26.5* 27.4*  PLT 96* 114*   BMET:  Recent Labs    09/17/20 0655 09/17/20 1014 09/18/20 0312  NA 131* 134* 133*  K 4.1 2.9* 3.4*  CL 97*  --  96*  CO2 23  --  29  GLUCOSE 106*  --  106*  BUN 31*  --  19  CREATININE 5.41*  --  4.01*  CALCIUM  9.2  --  9.2    CMET: Lab Results  Component Value Date   WBC 9.7 09/19/2020   HGB 9.3 (L) 09/19/2020   HCT 27.4 (L) 09/19/2020   PLT 114 (L) 09/19/2020   GLUCOSE 106 (H) 09/18/2020   CHOL 193 09/10/2020   TRIG 60 09/10/2020   HDL 69 09/10/2020   LDLCALC 112 (H) 09/10/2020   ALT 9 09/13/2020   AST 16 09/13/2020   NA 133 (L) 09/18/2020   K 3.4 (L) 09/18/2020   CL 96 (L) 09/18/2020   CREATININE 4.01 (H) 09/18/2020   BUN 19 09/18/2020   CO2 29 09/18/2020   TSH 4.375 07/03/2020   INR 1.3 (H) 09/13/2020   HGBA1C 5.2 09/13/2020      PT/INR: No results for input(s): LABPROT, INR in the last 72 hours. Radiology: DG CHEST PORT 1 VIEW  Result Date: 09/18/2020 CLINICAL DATA:  Pleural effusion J90 (ICD-10-CM) EXAM: PORTABLE CHEST 1 VIEW COMPARISON:  09/15/2020. FINDINGS: Right IJ sheath with tip projecting at the expected location of SVC, similar. Interval extubation. Interval removal of the left chest tube and mediastinal drain. Suspected new small left pneumothorax. Similar size of a small right pneumothorax. Overall improved lung aeration. Similar versus slightly increased small left pleural effusion with similar versus slightly increased overlying left basilar opacities. Similar enlargement of the cardiac silhouette status post CABG with median sternotomy and calcific atherosclerosis. IMPRESSION: 1. Interval removal of the left chest tube with suspected new small left pneumothorax. 2. Similar size of a small right pneumothorax. 3. Overall improved lung aeration. Similar versus slightly increased small left pleural effusion with overlying left basilar opacities, most likely atelectasis in the postoperative setting. Infection or aspiration is not excluded. 4. Cardiomegaly and central pulmonary vascular congestion. These results will be called to the ordering clinician or representative by the Radiologist Assistant, and communication documented in the PACS or [employer]. Electronically  Signed   By: Margaretha Sheffield MD   On: 09/18/2020 10:03     Assessment/Plan: S/P Procedure(s) (LRB): CORONARY ARTERY BYPASS GRAFTING (CABG), ON PUMP, TIMES TWO, USING LEFT INTERNAL MAMMARY ARTERY AND RIGHT ENDOSCOPICALLY HARVESTED GREATER SAPHENOUS VEIN (N/A) TRANSESOPHAGEAL ECHOCARDIOGRAM (TEE) (N/A)  1 afeb, BP control is poor but avapro, normodyn, norvasc and hydralizine have all been added in last 24 hours. In sinus rhythm/intermit afib,  now on po amiodarone 2 sats good on RA, CXR stable 3  hypokalemia improved 4 H/H stable-slightly improved now on BID PPI, conts Fe/aranesp- uncertain about GI status, prob medication related nausea but may need to consider GI eval  5 push nutrition as able with protein -cal malnutrition 6 no leukocytosis 7 thrombocytopenia trend improved 8 BS adeq controlled 9 nephrology managing renal issues/HD 10 cont rehab therapies      [name] PA-C Pager [phone] 09/19/2020 7:25 AM   Patient seen and examined, see note from PM rounds  Remo Lipps C. Roxan Hockey, MD [facility] [phone]

## 2020-09-19 NOTE — Progress Notes (Signed)
RT note. RN informed RT that patient recently vomit when trying to place patient on bipap for the night. RT will hold off on placing patient on bipap at this time. RN will let RT know when patient will be placed back on bipap. Patient currently on rm air sat 98%, no labored breathing noted. RT will continue to monitor

## 2020-09-19 NOTE — Progress Notes (Signed)
OT Cancellation Note  Patient Details Name: ASAIAH MILHOUS MRN: [id number] DOB: [birth year]   Cancelled Treatment:    Reason Eval/Treat Not Completed: Other (comment)- pt asleep on bipap, family requesting OT to wait until after pt rests and has dialysis.  OT will follow and see as able.   Jolaine Artist, OT Acute Rehabilitation Services Pager [phone] Office [phone]   Delight Stare 09/19/2020, 12:20 PM

## 2020-09-19 NOTE — Progress Notes (Signed)
Called Dr.Lightfoot regarding her blood pressure.SBP still  200's despite prn metropolol and pain meds.Order received.Will continue to monitor.

## 2020-09-19 NOTE — Progress Notes (Signed)
Inpatient Diabetes Program Recommendations  AACE/ADA: New Consensus Statement on Inpatient Glycemic Control (2015)  Target Ranges:  Prepandial:   less than 140 mg/dL      Peak postprandial:   less than 180 mg/dL (1-2 hours)      Critically ill patients:  140 - 180 mg/dL   Lab Results  Component Value Date   GLUCAP 76 09/19/2020   HGBA1C 5.2 09/13/2020    Review of Glycemic Control Results for Jodi Bell, Jodi Bell (MRN [id number]) as of 09/19/2020 10:24  Ref. Range 09/18/2020 07:00 09/18/2020 11:34 09/18/2020 16:06 09/18/2020 19:43 09/18/2020 23:25 09/19/2020 03:58 09/19/2020 04:51 09/19/2020 08:26  Glucose-Capillary Latest Ref Range: 70 - 99 mg/dL 85 145 (H) 143 (H) 180 (H) 71 55 (L) 244 (H) 76   Current orders for Inpatient glycemic control:  Novolog 0-24 units Q4 hours  A1c 5.2%  Inpatient Diabetes Program Recommendations:    Current Novolog Correction scale is too aggressive pt had hypoglycemia of 55.  -  reduce Novolog scale to 0-15 units tid + hs scale if eating regularly  May need addition of meal coverage if glucose trends increase after meal intake after scale adjustment  Thanks,  Tama Headings RN, MSN, BC-ADM Inpatient Diabetes Coordinator Team Pager [phone] (8a-5p)

## 2020-09-19 NOTE — Plan of Care (Signed)
  Problem: Education: Goal: Knowledge of General Education information will improve Description: Including pain rating scale, medication(s)/side effects and non-pharmacologic comfort measures Outcome: Progressing   Problem: Health Behavior/Discharge Planning: Goal: Ability to manage health-related needs will improve Outcome: Progressing   Problem: Clinical Measurements: Goal: Ability to maintain clinical measurements within normal limits will improve Outcome: Progressing Goal: Will remain free from infection Outcome: Progressing Goal: Diagnostic test results will improve Outcome: Progressing Goal: Respiratory complications will improve Outcome: Progressing Goal: Cardiovascular complication will be avoided Outcome: Progressing   Problem: Activity: Goal: Risk for activity intolerance will decrease Outcome: Progressing   Problem: Elimination: Goal: Will not experience complications related to bowel motility Outcome: Progressing   Problem: Pain Managment: Goal: General experience of comfort will improve Outcome: Progressing   Problem: Safety: Goal: Ability to remain free from injury will improve Outcome: Progressing   Problem: Skin Integrity: Goal: Risk for impaired skin integrity will decrease Outcome: Progressing

## 2020-09-19 NOTE — Progress Notes (Signed)
SLP Cancellation Note  Patient Details Name: Jodi Bell MRN: [id number] DOB: October 19, 1960   Cancelled treatment:       Reason Eval/Treat Not Completed: Medical issues which prohibited therapy. Pt on BiPAP this morning. Also note that she had vomiting overnight. Will continue to follow as able.     Osie Bond., M.A. [place] Acute Rehabilitation Services Pager [phone] Office [phone]  09/19/2020, 12:55 PM

## 2020-09-19 NOTE — Progress Notes (Signed)
Dr.Foster at bedside.Aware of blood pressure.Will put her back on her home meds and is scheduled for HD today.

## 2020-09-19 NOTE — Progress Notes (Signed)
Patient ID: Jodi Bell, female   DOB: [birth year], 60 y.o.   MRN: [id number]  [facility] Progress Note   Assessment/ Plan:   1.  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    2. ESRD: On hemodialysis on a Monday/Wednesday/Friday schedule as an outpatient  - HD per her MWF schedule - ordered renal panel for today - will follow  3. Anemia CKD: continue ESA  - She missed her aranesp 100 mcg dose which was to be given on Friday. Reschedule for today.  She had been getting outpatient.  4. HTN - off of some of her home meds.  Changing back to home med labetalol (ok with CTS).  To start labetalol 200 mg TID (reported home dose is 400 mg TID).  See irbesartan 150 mg daily started on 8/9 (home med olmesartan 40 mg daily - not on formulary here and per pharmacy equivalent would be irbesartan 300 mg daily).  Increase UF goal to 3 kg.  She is not actually on clonidine at home per her report.  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 On sensipar  6.  Acute neurological changes: possible seizure and  MRI deferred per neurology.  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  Per charting improved after HD and improved today  Subjective:   Last HD on 8/8 with 2 kg UF.  Spoke with outpatient HD unit for orders this AM.  She is concerned about her blood pressure.  She has multiple meds at home.  Review of systems:   Denies shortness of breath or chest pain Some nausea this am after eating attributes to type of food; none prior; no emesis   Objective:   BP (!) 186/82   Pulse 77   Temp 99 F (37.2 C) (Oral)   Resp 15   Ht '5\' 2"'$  (1.575 m)   Wt 65.1 kg   SpO2 97%   BMI 26.25 kg/m   Physical Exam:   General adult female in bed in no acute distress HEENT normocephalic atraumatic sclera anicteric Neck supple trachea  midline Lungs clear to auscultation bilaterally normal work of breathing at rest on room air Heart S1S2 no rub Abdomen soft nontender nondistended Extremities no edema  Psych normal mood and affect Neuro - alert and conversant Access LUE AVF bruit and thrill   Labs: BMET Recent Labs  Lab 09/12/20 1946 09/13/20 0333 09/13/20 1940 09/13/20 2009 09/14/20 0500 09/14/20 1710 09/15/20 0440 09/16/20 0405 09/17/20 0655 09/17/20 1014 09/18/20 0312  NA 132*   < > 134*   < > 132* 132* 130* 133* 131* 134* 133*  K 3.4*   < > 3.7   < > 3.7 4.8 5.0 4.2 4.1 2.9* 3.4*  CL 97*   < > 100  --  98 98 96* 97* 97*  --  96*  CO2 26   < > 25  --  '25 24 24 27 23  '$ --  29  GLUCOSE 153*   < > 84  --  102* 76 88 91 106*  --  106*  BUN 7   < > 13  --  15 20 25* 21* 31*  --  19  CREATININE 2.57*   < > 3.42*  --  3.92* 4.72* 5.26* 4.28* 5.41*  --  4.01*  CALCIUM 9.3   < > 8.8*  --  9.1 9.8  9.7 9.3 9.2  --  9.2  PHOS 1.5*  --  3.2  --   --   --   --   --  3.2  3.3  --   --    < > = values in this interval not displayed.   CBC Recent Labs  Lab 09/16/20 0405 09/17/20 0655 09/17/20 1014 09/18/20 0312 09/19/20 0444  WBC 8.1 8.7  --  7.0 9.7  HGB 10.4* 9.8* 11.2* 9.0* 9.3*  HCT 31.3* 30.0* 33.0* 26.5* 27.4*  MCV 89.9 90.1  --  88.0 87.0  PLT 113* 114*  --  96* 114*      Medications:     sodium chloride   Intravenous Once   allopurinol  100 mg Oral QPM   amiodarone  200 mg Oral BID   amLODipine  10 mg Oral Daily   aspirin EC  325 mg Oral Daily   Or   aspirin  324 mg Per Tube Daily   bisacodyl  10 mg Oral Daily   Or   bisacodyl  10 mg Rectal Daily   calcium acetate  2,001 mg Oral TID WC   chlorhexidine  15 mL Mouth Rinse BID   Chlorhexidine Gluconate Cloth  6 each Topical Daily   Chlorhexidine Gluconate Cloth  6 each Topical Q0600   cholecalciferol  1,000 Units Oral QPM   cinacalcet  30 mg Oral Q T,Th,Sa-HD   darbepoetin (ARANESP) injection - DIALYSIS  100 mcg Intravenous Q Fri-HD    docusate sodium  200 mg Oral Daily   enoxaparin (LOVENOX) injection  30 mg Subcutaneous Q24H   ferrous sulfate  325 mg Oral Once per day on Mon Wed Fri   fluticasone  1 spray Each Nare Daily   folic acid  1 mg Oral Daily   insulin aspart  0-24 Units Subcutaneous Q4H   irbesartan  150 mg Oral Daily   magnesium oxide  200 mg Oral Daily   mouth rinse  15 mL Mouth Rinse q12n4p   metoprolol tartrate  12.5 mg Oral BID   Or   metoprolol tartrate  12.5 mg Per Tube BID   montelukast  10 mg Oral Q supper   nystatin  5 mL Oral QID   pantoprazole  40 mg Oral BID   polyethylene glycol  17 g Oral Daily   polyvinyl alcohol  1 drop Both Eyes TID AC & HS   predniSONE  5 mg Oral Q breakfast   saccharomyces boulardii  250 mg Oral Daily   sodium chloride flush  3 mL Intravenous Q12H   umeclidinium bromide  1 puff Inhalation Daily   Outpatient HD orders  OP HD: MWF [facility])  3.5h   3.0/2.5 bath   Hep 3500 bolus and 500 main LUA AVF   400/600 EDW 64 kg (and she was getting to this) ESA: aranesp 90 mcg weekly Other meds: not on calcitriol Allergic to Ferrlecit (no reaction is provided by unit)  Claudia Desanctis, MD 09/19/2020, 6:44 AM

## 2020-09-20 ENCOUNTER — Inpatient Hospital Stay (HOSPITAL_COMMUNITY): Payer: Medicare PPO

## 2020-09-20 DIAGNOSIS — K922 Gastrointestinal hemorrhage, unspecified: Secondary | ICD-10-CM | POA: Diagnosis not present

## 2020-09-20 DIAGNOSIS — E440 Moderate protein-calorie malnutrition: Secondary | ICD-10-CM | POA: Insufficient documentation

## 2020-09-20 LAB — RENAL FUNCTION PANEL
Albumin: 1.9 g/dL — ABNORMAL LOW (ref 3.5–5.0)
Anion gap: 10 (ref 5–15)
BUN: 26 mg/dL — ABNORMAL HIGH (ref 6–20)
CO2: 27 mmol/L (ref 22–32)
Calcium: 9.2 mg/dL (ref 8.9–10.3)
Chloride: 97 mmol/L — ABNORMAL LOW (ref 98–111)
Creatinine, Ser: 4.1 mg/dL — ABNORMAL HIGH (ref 0.44–1.00)
GFR, Estimated: 12 mL/min — ABNORMAL LOW (ref >60)
Glucose, Bld: 85 mg/dL (ref 70–99)
Phosphorus: 1.2 mg/dL — ABNORMAL LOW (ref 2.5–4.6)
Potassium: 4 mmol/L (ref 3.5–5.1)
Sodium: 134 mmol/L — ABNORMAL LOW (ref 135–145)

## 2020-09-20 LAB — GLUCOSE, CAPILLARY
Glucose-Capillary: 72 mg/dL (ref 70–99)
Glucose-Capillary: 81 mg/dL (ref 70–99)
Glucose-Capillary: 82 mg/dL (ref 70–99)
Glucose-Capillary: 88 mg/dL (ref 70–99)
Glucose-Capillary: 92 mg/dL (ref 70–99)
Glucose-Capillary: 93 mg/dL (ref 70–99)

## 2020-09-20 LAB — CBC
HCT: 23.8 % — ABNORMAL LOW (ref 36.0–46.0)
Hemoglobin: 8.3 g/dL — ABNORMAL LOW (ref 12.0–15.0)
MCH: 29.9 pg (ref 26.0–34.0)
MCHC: 34.9 g/dL (ref 30.0–36.0)
MCV: 85.6 fL (ref 80.0–100.0)
Platelets: 109 10*3/uL — ABNORMAL LOW (ref 150–400)
RBC: 2.78 MIL/uL — ABNORMAL LOW (ref 3.87–5.11)
RDW: 23.6 % — ABNORMAL HIGH (ref 11.5–15.5)
WBC: 10.3 10*3/uL (ref 4.0–10.5)
nRBC: 0.3 % — ABNORMAL HIGH (ref 0.0–0.2)

## 2020-09-20 LAB — PREPARE RBC (CROSSMATCH)

## 2020-09-20 LAB — HEMOGLOBIN AND HEMATOCRIT, BLOOD
HCT: 24.3 % — ABNORMAL LOW (ref 36.0–46.0)
Hemoglobin: 7.9 g/dL — ABNORMAL LOW (ref 12.0–15.0)

## 2020-09-20 LAB — PHOSPHORUS: Phosphorus: 2.7 mg/dL (ref 2.5–4.6)

## 2020-09-20 MED ORDER — IRBESARTAN 300 MG PO TABS
300.0000 mg | ORAL_TABLET | Freq: Every day | ORAL | Status: DC
  Administered 2020-09-20 – 2020-10-04 (×15): 300 mg via ORAL
  Filled 2020-09-20 (×15): qty 1

## 2020-09-20 MED ORDER — PANTOPRAZOLE SODIUM 40 MG IV SOLR: 40.0000 mg | Freq: Two times a day (BID) | INTRAVENOUS | Status: DC

## 2020-09-20 MED ORDER — HYDROCORTISONE ACETATE 25 MG RE SUPP
25.0000 mg | Freq: Every day | RECTAL | Status: DC
  Administered 2020-09-20 – 2020-09-23 (×4): 25 mg via RECTAL
  Filled 2020-09-20 (×12): qty 1

## 2020-09-20 MED ORDER — METOPROLOL TARTRATE 25 MG PO TABS
25.0000 mg | ORAL_TABLET | Freq: Two times a day (BID) | ORAL | Status: DC
  Administered 2020-09-20: 25 mg via ORAL
  Filled 2020-09-20: qty 1

## 2020-09-20 MED ORDER — PANTOPRAZOLE INFUSION (NEW) - SIMPLE MED
8.0000 mg/h | INTRAVENOUS | Status: AC
  Administered 2020-09-20 – 2020-09-23 (×5): 8 mg/h via INTRAVENOUS
  Filled 2020-09-20: qty 80
  Filled 2020-09-20: qty 100
  Filled 2020-09-20: qty 80
  Filled 2020-09-20: qty 100
  Filled 2020-09-20 (×3): qty 80

## 2020-09-20 MED ORDER — RENA-VITE PO TABS
1.0000 | ORAL_TABLET | Freq: Every day | ORAL | Status: DC
  Administered 2020-09-21 – 2020-10-03 (×13): 1 via ORAL
  Filled 2020-09-20 (×13): qty 1

## 2020-09-20 MED ORDER — METOPROLOL TARTRATE 5 MG/5ML IV SOLN
2.5000 mg | INTRAVENOUS | Status: DC | PRN
  Administered 2020-09-20: 2.5 mg via INTRAVENOUS
  Administered 2020-09-21: 5 mg via INTRAVENOUS
  Filled 2020-09-20 (×2): qty 5

## 2020-09-20 MED ORDER — SODIUM PHOSPHATES 45 MMOLE/15ML IV SOLN
30.0000 mmol | Freq: Once | INTRAVENOUS | Status: AC
  Administered 2020-09-20: 30 mmol via INTRAVENOUS
  Filled 2020-09-20: qty 10

## 2020-09-20 MED ORDER — CHLORHEXIDINE GLUCONATE CLOTH 2 % EX PADS
6.0000 | MEDICATED_PAD | Freq: Every day | CUTANEOUS | Status: DC
  Administered 2020-09-21 – 2020-09-24 (×3): 6 via TOPICAL

## 2020-09-20 MED ORDER — ENSURE ENLIVE PO LIQD
237.0000 mL | Freq: Three times a day (TID) | ORAL | Status: DC
  Administered 2020-09-20 – 2020-10-04 (×17): 237 mL via ORAL

## 2020-09-20 MED ORDER — TECHNETIUM TC 99M-LABELED RED BLOOD CELLS IV KIT
25.0000 | PACK | Freq: Once | INTRAVENOUS | Status: AC | PRN
  Administered 2020-09-20: 25 via INTRAVENOUS

## 2020-09-20 MED ORDER — CINACALCET HCL 30 MG PO TABS
30.0000 mg | ORAL_TABLET | ORAL | Status: DC
  Administered 2020-09-20 – 2020-10-01 (×6): 30 mg via ORAL
  Filled 2020-09-20 (×4): qty 1

## 2020-09-20 MED ORDER — SODIUM CHLORIDE 0.9% IV SOLUTION: Freq: Once | INTRAVENOUS | Status: DC

## 2020-09-20 NOTE — Procedures (Signed)
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  Anesthesia Topical only with 1% lidocaine   Timeout Verified patient identification, verified procedure, site/side was marked, verified correct patient position, special equipment/implants available, medications/allergies/relevant history reviewed, required imaging and test results available.  Sterile Technique Maximal sterile technique including full sterile barrier drape, hand hygiene, sterile gown, sterile gloves, mask, hair covering, sterile ultrasound probe cover (if used).  Procedure Description Area of catheter insertion was cleaned with chlorhexidine and draped in sterile fashion.  Without real-time ultrasound guidance a central venous catheter was placed into the left internal jugular vein by guidewire exchange over a previously existing central venous catheter. Nonpulsatile blood flow and easy flushing noted in all ports.  The catheter was sutured in place and sterile dressing applied.  Complications/Tolerance None; patient tolerated the procedure well. Chest X-ray is ordered to verify placement for internal jugular or subclavian cannulation.   Chest x-ray is not ordered for femoral cannulation.  EBL Minimal  Specimen(s) None  Renee Pain, MD Board Certified by the ABIM, [place]

## 2020-09-20 NOTE — H&P (View-Only) (Signed)
Referring Provider: Cardiothoracic surgery Primary Care Physician:  [name]., MD Primary Gastroenterologist:  [facility]  Reason for Consultation: Anemia  HPI: Jodi Bell is a 60 y.o. female with past medical history of end-stage renal disease on dialysis, refractory hypertension, history of CHF, diabetes, diabetic neuropathy, COPD, rheumatoid arthritis on chronic steroids, coronary artery disease presented to the hospital with chest pain and shortness of breath on September 08, 2020.  Upon initial evaluation, she was found to have elevated troponins and was subsequently diagnosed with NSTEMI and multivessel coronary artery disease.  She underwent CABG X 2 on September 13, 2020.  She subsequently developed altered mental status and seizure-like activity on September 15, 2020.  Neurological work-up was negative.  She was found to have mild drop in hemoglobin.  GI is consulted for further evaluation. Hemoglobin on admission on September 09, 2020 was 7.5.  Hemoglobin yesterday was 9.3.  Hemoglobin today is 8.3.  Patient seen and examined at bedside.  Family at bedside.  She had 1 episode of vomiting last night.  She denies abdominal pain.  Bowel movement today showed dark stool surrounded by streaks of fresh blood.  Previous GI work-up  Patient was admitted to the hospital 04/2019 at [facility].  She had EGD and colonoscopy at that time.  EGD showed gastritis.  Biopsies were negative for H. pylori.  Colonoscopy showed tubular adenoma.  Repeat colonoscopy was recommended in 3 years with 2-day prep. -Full report not available to review.  EGD in April 2021 showed esophagitis, esophageal ulcer, scattered white plaques in the esophagus and duodenal AVM which was treated with APC.  Esophageal biopsies showed acute esophagitis , negative for Candida esophagitis.  -Capsule endoscopy 05/2019 showed few erosions and one small nonbleeding AVM in the proximal jejunum.  No active bleeding.    - EGD in  August 2019 by Dr. Penelope Coop for evaluation of anemia showed small adenopathy otherwise normal.   - Colonoscopy in August 2019 showed multiple ulcers in the proximal to mid transverse colon.  It was treated with epinephrine injection and clips placement.    Past Medical History:  Diagnosis Date   Anemia of chronic disease    Anxiety    Arthritis    oa and psoriatic ra   Asthma    Back pain, chronic    lower back   Candida infection finished tx 2 days ago   Chronic insomnia    COPD (chronic obstructive pulmonary disease) (HCC)    Coronary artery calcification seen on CT scan    Diabetes mellitus    type 2   Eczema    ESRD on dialysis [facility])    Essential hypertension    GERD (gastroesophageal reflux disease)    Gout    History of hiatal hernia    Hypoalbuminemia    Hyponatremia    Mild aortic stenosis    Mitral regurgitation    Mitral stenosis    Obesity    PAD (peripheral artery disease) ([place])    a. externial iliac calcification seen on CT 04/2020.   Pneumonia few yrs ago x 2   Sleep apnea    Tobacco abuse    Upper GI bleed 05/2019    Past Surgical History:  Procedure Laterality Date   BACK SURGERY  2016   lower back fusion with cage   BIOPSY  09/23/2017   Procedure: BIOPSY;  Surgeon: Wonda Horner, MD;  Location: [facility];  Service: Endoscopy;;   BIOPSY  05/19/2019  Procedure: BIOPSY;  Surgeon: Otis Brace, MD;  Location: [place];  Service: Gastroenterology;;   CESAREAN SECTION  1990   x 1    COLONOSCOPY WITH PROPOFOL N/A 09/23/2017   Procedure: COLONOSCOPY WITH PROPOFOL Hemostatic clips placed;  Surgeon: Wonda Horner, MD;  Location: [facility];  Service: Endoscopy;  Laterality: N/A;   CORONARY ARTERY BYPASS GRAFT N/A 09/13/2020   Procedure: CORONARY ARTERY BYPASS GRAFTING (CABG), ON PUMP, TIMES TWO, USING LEFT INTERNAL MAMMARY ARTERY AND RIGHT ENDOSCOPICALLY HARVESTED GREATER SAPHENOUS VEIN;  Surgeon: Gaye Pollack, MD;  Location: [place];  Service: Open  Heart Surgery;  Laterality: N/A;   DILATION AND CURETTAGE OF UTERUS  1988   ESOPHAGOGASTRODUODENOSCOPY (EGD) WITH PROPOFOL N/A 09/23/2017   Procedure: ESOPHAGOGASTRODUODENOSCOPY (EGD) WITH PROPOFOL;  Surgeon: Wonda Horner, MD;  Location: [facility];  Service: Endoscopy;  Laterality: N/A;   ESOPHAGOGASTRODUODENOSCOPY (EGD) WITH PROPOFOL N/A 05/19/2019   Procedure: ESOPHAGOGASTRODUODENOSCOPY (EGD) WITH PROPOFOL;  Surgeon: Otis Brace, MD;  Location: [place];  Service: Gastroenterology;  Laterality: N/A;   GIVENS CAPSULE STUDY N/A 05/19/2019   Procedure: GIVENS CAPSULE STUDY;  Surgeon: Otis Brace, MD;  Location: [place];  Service: Gastroenterology;  Laterality: N/A;   HOT HEMOSTASIS N/A 05/19/2019   Procedure: HOT HEMOSTASIS (ARGON PLASMA COAGULATION/BICAP);  Surgeon: Otis Brace, MD;  Location: [facility];  Service: Gastroenterology;  Laterality: N/A;   LEFT HEART CATH AND CORONARY ANGIOGRAPHY N/A 09/10/2020   Procedure: LEFT HEART CATH AND CORONARY ANGIOGRAPHY;  Surgeon: Nelva Bush, MD;  Location: [place] [facility];  Service: Cardiovascular;  Laterality: N/A;   PARTIAL KNEE ARTHROPLASTY Left 11/12/2017   Procedure: left unicompartmental arthroplasty-medial;  Surgeon: Paralee Cancel, MD;  Location: [facility];  Service: Orthopedics;  Laterality: Left;  80mn   SUBMUCOSAL INJECTION  09/23/2017   Procedure: SUBMUCOSAL INJECTION;  Surgeon: GWonda Horner MD;  Location: [facility];  Service: Endoscopy;;  in colon   TEE WITHOUT CARDIOVERSION N/A 09/13/2020   Procedure: TRANSESOPHAGEAL ECHOCARDIOGRAM (TEE);  Surgeon: BGaye Pollack MD;  [place]  Service: Open Heart Surgery;  Laterality: N/A;   TUBAL LIGATION  1990    Prior to Admission medications   Medication Sig Start Date End Date Taking? Authorizing Provider  acetaminophen (TYLENOL) 500 MG tablet Take 1,000 mg by mouth every 6 (six) hours as needed for mild pain.   Yes [provider]  albuterol  (PROVENTIL) (2.5 MG/3ML) 0.083% nebulizer solution Take 2.5 mg by nebulization as needed for wheezing or shortness of breath. 04/15/19  Yes [provider]  allopurinol (ZYLOPRIM) 100 MG tablet Take 100 mg by mouth every evening.   Yes [provider]  amLODipine (NORVASC) 10 MG tablet Take 10 mg by mouth daily.   Yes [provider]  calcium acetate (PHOSLO) 667 MG capsule Take 3 capsules by mouth in the morning and at bedtime. Take 2 capsules with snacks. 10/25/18  Yes [provider]  carboxymethylcellulose 1 % ophthalmic solution Place 1 drop into both eyes 4 (four) times daily.   Yes [provider]  cetirizine (ZYRTEC) 10 MG tablet Take 10 mg by mouth 2 (two) times daily as needed (hives).  06/13/17  Yes WShelda Pal DO  Cholecalciferol (VITAMIN D3) 10 MCG (400 UNIT) tablet Take 400 Units by mouth every evening.   Yes [provider]  cloNIDine (CATAPRES) 0.3 MG tablet Take 1 tablet (0.3 mg total) by mouth 3 (three) times daily. 05/21/19  Yes AHosie Poisson MD  cyclobenzaprine ([place]  10 MG tablet Take 10 mg by mouth 3 (three) times daily as needed for muscle spasms. 06/08/20  Yes [provider]  ELDERBERRY PO Take 2 drops by mouth daily.   Yes [provider]  ferrous sulfate 325 (65 FE) MG tablet Take 325 mg by mouth 3 (three) times a week. TUE THUR SAT   Yes [provider]  fluticasone (FLONASE) 50 MCG/ACT nasal spray Place 1 spray into both nostrils daily.   Yes [provider]  folic acid (FOLVITE) [id number] MCG tablet Take 400 mcg by mouth daily.    Yes [provider]  furosemide (LASIX) 40 MG tablet Take 40 mg by mouth 2 (two) times daily.    Yes [provider]  gabapentin (NEURONTIN) 300 MG capsule Take 1 capsule (300 mg total) by mouth at bedtime as needed (Neuropathic pain). Patient taking differently: Take 300 mg by mouth as needed (pain). 12/14/17 10/20/20 Yes Dana Allan I, MD  hydrALAZINE (APRESOLINE) 100 MG tablet Take 1 tablet (100 mg total) by mouth every 8 (eight) hours. 07/06/20  Yes [name], MD  isosorbide mononitrate (IMDUR) 60 MG 24 hr tablet Take 1 tablet (60 mg total) by mouth daily. 03/02/18  Yes Thurnell Lose, MD  labetalol (NORMODYNE) 200 MG tablet Take 400 mg by mouth 3 (three) times daily.   Yes [provider]  LORazepam (ATIVAN) 1 MG tablet Take 1 mg by mouth every 8 (eight) hours as needed for anxiety. 06/25/20  Yes [provider]  montelukast (SINGULAIR) 10 MG tablet Take 10 mg by mouth daily with supper.    Yes [provider]  multivitamin (RENA-VIT) TABS tablet Take 1 tablet by mouth daily with supper.   Yes [provider]  nitroGLYCERIN (NITROSTAT) 0.4 MG SL tablet Place 0.4 mg under the tongue every 5 (five) minutes as needed for chest pain. 08/01/20  Yes [provider]  olmesartan (BENICAR) 40 MG tablet Take 40 mg by mouth every evening. 05/06/19  Yes [provider]  oxyCODONE (ROXICODONE) 15 MG immediate release tablet Take 15 mg by mouth 3 (three) times daily as needed for pain. 05/31/20  Yes [provider]  pantoprazole (PROTONIX) 40 MG tablet Take 40 mg by mouth daily. 06/09/20  Yes [provider]  predniSONE (DELTASONE) 5 MG tablet Take 1 tablet (5 mg total) by mouth daily with breakfast. 07/06/20  Yes [name], MD  Probiotic Product (PROBIOTIC PO) Take 1 capsule by mouth daily.   Yes [provider]  Tiotropium Bromide Monohydrate (SPIRIVA RESPIMAT IN) Inhale 2 puffs into the lungs daily.   Yes [provider]  VENTOLIN HFA 108 (90 Base) MCG/ACT inhaler Inhale 1-2 puffs into the lungs every 6 (six) hours as needed for shortness of breath. wheezing 06/13/15  Yes [provider]  XELJANZ 5 MG TABS Take 5 mg by mouth at bedtime. 05/17/19  Yes [provider]  zolpidem (AMBIEN) 5 MG tablet Take 5 mg by mouth  at bedtime. 05/05/19  Yes [provider]  cinacalcet (SENSIPAR) 30 MG tablet Take 30 mg by mouth 3 (three) times a week. Tuesday, Thursday, Saturday. Patient not taking: Reported on 09/09/2020    [provider]  EPINEPHrine (EPIPEN) 0.3 mg/0.3 mL SOAJ injection Inject 0.3 mLs (0.3 mg total) into the muscle once. Patient taking differently: Inject 0.3 mg into the muscle as needed (for allergic reaction). 09/30/12   Le, Thao P, DO  JANUVIA 25 MG tablet Take 1 tablet (  25 mg total) by mouth daily. Patient not taking: Reported on 09/09/2020 07/06/20   [name], MD    Scheduled Meds:  allopurinol  100 mg Oral QPM   amiodarone  200 mg Oral BID   amLODipine  10 mg Oral Daily   aspirin EC  325 mg Oral Daily   Or   aspirin  324 mg Per Tube Daily   bisacodyl  10 mg Oral Daily   Or   bisacodyl  10 mg Rectal Daily   chlorhexidine  15 mL Mouth Rinse BID   Chlorhexidine Gluconate Cloth  6 each Topical Daily   cholecalciferol  1,000 Units Oral QPM   [START ON 09/21/2020] cinacalcet  30 mg Oral Q M,W,F-HD   darbepoetin (ARANESP) injection - DIALYSIS  100 mcg Intravenous Q Wed-HD   docusate sodium  200 mg Oral Daily   enoxaparin (LOVENOX) injection  30 mg Subcutaneous Q24H   feeding supplement (NEPRO CARB STEADY)  237 mL Oral BID BM   ferrous sulfate  325 mg Oral Once per day on Mon Wed Fri   fluticasone  1 spray Each Nare Daily   folic acid  1 mg Oral Daily   insulin aspart  0-24 Units Subcutaneous Q4H   irbesartan  300 mg Oral Daily   magnesium oxide  200 mg Oral Daily   mouth rinse  15 mL Mouth Rinse q12n4p   metoprolol tartrate  25 mg Oral BID   montelukast  10 mg Oral Q supper   nystatin  5 mL Oral QID   pantoprazole  40 mg Oral BID   polyethylene glycol  17 g Oral Daily   polyvinyl alcohol  1 drop Both Eyes TID AC & HS   predniSONE  5 mg Oral Q breakfast   saccharomyces boulardii  250 mg Oral Daily   sodium chloride flush  3 mL Intravenous Q12H   umeclidinium  bromide  1 puff Inhalation Daily   Continuous Infusions:  sodium chloride     sodium chloride     sodium phosphate  Dextrose 5% IVPB 43 mL/hr at 09/20/20 0800   PRN Meds:.sodium chloride, sodium chloride, albuterol, alteplase, fentaNYL (SUBLIMAZE) injection, food thickener, gabapentin, heparin, heparin, heparin, lidocaine-prilocaine, ondansetron (ZOFRAN) IV, oxyCODONE, pentafluoroprop-tetrafluoroeth, sodium chloride flush  Allergies as of 09/08/2020 - Review Complete 09/08/2020  Allergen Reaction Noted   3-methyl-2-benzothiazolinone hydrazone Other (See Comments) 09/07/2012   Banana Anaphylaxis and Swelling 01/21/2018   Black walnut flavor Anaphylaxis 05/18/2019   Hazelnut (filbert) allergy skin test Anaphylaxis 05/18/2019   Leflunomide and related Other (See Comments) 07/23/2012   Lisinopril Swelling 01/05/2014   No healthtouch food allergies Anaphylaxis 05/18/2019   Other Other (See Comments), Anaphylaxis, and Swelling 09/07/2012   Pecan extract allergy skin test Anaphylaxis 05/18/2019   Pecan nut (diagnostic) Anaphylaxis 05/18/2019   Trazodone and nefazodone Other (See Comments) 07/23/2014   Adalimumab Other (See Comments) 07/23/2012   Escitalopram oxalate Other (See Comments) 09/29/2013   Ezetimibe Other (See Comments) 01/13/2018   Secukinumab Swelling 05/18/2019   Statins Other (See Comments) 09/04/2014   Ibuprofen Other (See Comments) 09/04/2014    Family History  Problem Relation Age of Onset   Lung cancer Mother    Diabetes Sister    Heart disease Sister    Asthma Daughter    Arthritis Daughter    Arthritis Daughter    Thyroid cancer Other        1/2 sister   Heart disease Other        1/2  sister    Social History   Socioeconomic History   Marital status: Divorced    Spouse name: BASIL   Number of children: 3   Years of education: 1   Highest education level: GED or equivalent  Occupational History   Occupation: retired Corporate treasurer  Tobacco Use   Smoking status:  Every Day    Packs/day: 0.50    Years: 40.00    Pack years: 20.00    Types: Cigarettes   Smokeless tobacco: Never  Vaping Use   Vaping Use: Never used  Substance and Sexual Activity   Alcohol use: Yes    Comment: occ   Drug use: No   Sexual activity: Not on file  Other Topics Concern   Not on file  Social History Narrative   Not on file   Social Determinants of Health   Financial Resource Strain: Not on file  Food Insecurity: Not on file  Transportation Needs: Not on file  Physical Activity: Not on file  Stress: Not on file  Social Connections: Not on file  Intimate Partner Violence: Not on file    Review of Systems: All negative except as stated above in HPI.  Physical Exam: Vital signs: Vitals:   09/20/20 0806 09/20/20 0836  BP:  136/66  Pulse:  85  Resp:    Temp: 99.6 F (37.6 C)   SpO2:  100%   Last BM Date: 09/20/20 General:   Lethargic, sleepy, not in acute distress HEENT : Normocephalic, atraumatic, extraocular movement intact CHEST : Midline incision from recent bypass surgery noted Lungs: No respiratory distress Heart:  Regular rate and rhythm; no murmurs, clicks, rubs,  or gallops. Abdomen: Soft, nontender, nondistended, bowel sounds present. Neuro : responds to verbal command but somewhat lethargic Psych : Mood and affect normal Rectal:  Deferred  GI:  Lab Results: Recent Labs    09/18/20 0312 09/19/20 0444 09/20/20 0053  WBC 7.0 9.7 10.3  HGB 9.0* 9.3* 8.3*  HCT 26.5* 27.4* 23.8*  PLT 96* 114* 109*   BMET Recent Labs    09/18/20 0312 09/19/20 0641 09/20/20 0053  NA 133* 134* 134*  K 3.4* 3.8 4.0  CL 96* 98 97*  CO2 '29 26 27  '$ GLUCOSE 106* 100* 85  BUN 19 27* 26*  CREATININE 4.01* 5.34* 4.10*  CALCIUM 9.2 9.8 9.2   LFT Recent Labs    09/20/20 0053  ALBUMIN 1.9*   PT/INR No results for input(s): LABPROT, INR in the last 72 hours.   Studies/Results: No results found.  Impression/Plan: -GI bleed with dark-colored  stool surrounded by streaks of fresh blood.  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 -S/p CABG for NSTEMI on 09/13/2020  -End-stage renal disease on dialysis -Altered mental status. ?  Etiology . improving.  Recommendations ------------------------ -Start Protonix drip -Start Anusol suppository -If ongoing bleeding, consider bleeding scan or CTA for further evaluation.  -  She is not strong enough for colonoscopy prep a this time and her limited oral intake may be challenging to give her colonoscopy prep. -Monitor H&H.  Transfuse if hemoglobin less than 8. -Management options discussed with family at bedside.  They verbalized understanding. - GI will follow    LOS: 12 days   Otis Brace  MD, FACP 09/20/2020, 9:25 AM  Contact #  [phone]

## 2020-09-20 NOTE — Progress Notes (Addendum)
Physical Therapy Treatment Patient Details Name: Jodi Bell MRN: [id number] DOB: [birth year] Today's Date: 09/20/2020    History of Present Illness Pt is a 60 y.o. female admitted 09/08/20 with acute onset SOB. Workup for CHF decompensaiton, COPD exacerbation, HTN urgency, NSTEMI; need for emergent HD. S/p LHC 8/1 which showed severe two-vessel CAD. CABGx2 8/4. PMH includes ESRD (HD MWF), HTN, DM2, COPD, OSA, psoriatic RA, PAD, anemia, tobacco abuse.    PT Comments    Pt with flat affect, decreased responsiveness  and participation with DC plan changed to SNF given pt current decline in function and cognition with which pt states she is agreeable. Pt with decreased function and cognition limiting progression. Pt educated for benefit, need for mobility and HEP but only limited engagement in session from pt and will continue to attempt.   HR 85-88 spO2 100% on RA Pre session 136/66, post session 129/77   Follow Up Recommendations  SNF;Supervision/Assistance - 24 hour     Equipment Recommendations  3in1 (PT)    Recommendations for Other Services       Precautions / Restrictions Precautions Precautions: Fall;Sternal Precaution Comments: reviewed sternal precautions pt with no carryover or awareness    Mobility  Bed Mobility Overal bed mobility: Needs Assistance Bed Mobility: Rolling;Sidelying to Sit Rolling: Min assist Sidelying to sit: Mod assist       General bed mobility comments: cues for sequence, assist to bend knees and roll with assist to elevate trunk from surface, assist to scoot to EOB    Transfers Overall transfer level: Needs assistance   Transfers: Sit to/from Stand Sit to Stand: Min assist;+2 safety/equipment         General transfer comment: cues for hand placement, reinforcing precautions with pt demonstrating lack of awareness. Sitting prematurely with chair pulled under her.  Ambulation/Gait Ambulation/Gait assistance: Min assist;+2 physical  assistance Gait Distance (Feet): 3 Feet Assistive device: Rolling walker (2 wheeled) Gait Pattern/deviations: Shuffle;Trunk flexed   Gait velocity interpretation: <1.8 ft/sec, indicate of risk for recurrent falls General Gait Details: pt stepping 3' from bed to chair and sitting prematurely with pt refusing to ambulate further or attempt again. Assist to control trunk and RW   Stairs             Wheelchair Mobility    Modified Rankin (Stroke Patients Only)       Balance Overall balance assessment: Needs assistance Sitting-balance support: Feet supported;Single extremity supported Sitting balance-Leahy Scale: Poor Sitting balance - Comments: pt continues to reach for assist of single UE       Standing balance comment: bil UE support on RW                            Cognition Arousal/Alertness: Awake/alert Behavior During Therapy: Flat affect Overall Cognitive Status: Impaired/Different from baseline Area of Impairment: Attention;Following commands;Memory;Safety/judgement;Awareness;Problem solving                   Current Attention Level: Sustained Memory: Decreased short-term memory;Decreased recall of precautions Following Commands: Follows one step commands with increased time;Follows one step commands inconsistently Safety/Judgement: Decreased awareness of safety;Decreased awareness of deficits Awareness: Emergent Problem Solving: Slow processing;Decreased initiation;Difficulty sequencing;Requires verbal cues;Requires tactile cues General Comments: decreased recall and adherence to precautions      Exercises General Exercises - Lower Extremity Long Arc Quad: AROM;Both;Seated;10 reps    General Comments        Pertinent Vitals/Pain  Home Living                      Prior Function            PT Goals (current goals can now be found in the care plan section) Acute Rehab PT Goals PT Goal Formulation: With  patient Time For Goal Achievement: 10/04/20 Potential to Achieve Goals: Fair Progress towards PT goals: Not progressing toward goals - comment;Goals downgraded-see care plan    Frequency    Min 3X/week      PT Plan Discharge plan needs to be updated    Co-evaluation              AM-PAC PT "6 Clicks" Mobility   Outcome Measure  Help needed turning from your back to your side while in a flat bed without using bedrails?: A Little Help needed moving from lying on your back to sitting on the side of a flat bed without using bedrails?: A Lot Help needed moving to and from a bed to a chair (including a wheelchair)?: A Little Help needed standing up from a chair using your arms (e.g., wheelchair or bedside chair)?: A Lot Help needed to walk in hospital room?: Total Help needed climbing 3-5 steps with a railing? : Total 6 Click Score: 12    End of Session Equipment Utilized During Treatment: Gait belt Activity Tolerance: Patient tolerated treatment well;Patient limited by fatigue Patient left: in chair;with call bell/phone within reach;with chair alarm set Nurse Communication: Mobility status PT Visit Diagnosis: Other abnormalities of gait and mobility (R26.89);Muscle weakness (generalized) (M62.81);Difficulty in walking, not elsewhere classified (R26.2)     Time: [id number] PT Time Calculation (min) (ACUTE ONLY): 25 min  Charges:  $Therapeutic Activity: 23-37 mins                     Hiroyuki Ozanich P, PT Acute Rehabilitation Services Pager: [phone] Office: [place] Emoni Whitworth 09/20/2020, 8:56 AM

## 2020-09-20 NOTE — Progress Notes (Signed)
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   GOAL:   Patient will meet greater than or equal to 90% of their needs  MONITOR:   PO intake, Supplement acceptance, Labs, Weight trends  REASON FOR ASSESSMENT:   Rounds, LOS    ASSESSMENT:   60 yo female admitted with NSTEMI and required CABG. PMH includes ESRD on HD, HTN, DM, COPD, PAD  8/04 CABG x 2  Pt is very weak, decompensated  GI consulted for GI bleed. +BM with blood on visit today Noted pt with episodes of nausea and vomiting as well  Pt with poor appetite No recorded po intake since breakfast on 8/9; prior to this, nothing recorded since 8/3. Recorded po intake 0-100%.  Pt drinking some Nepro on visit but reports she likes Chocolate. Plan to change to Ensure as we do not carry chocolate Nepro in-house  Pt is edentulous but has dentures at bedside per family. Currently on Dysphagia 2 diet, SLP following. Family asking if it is ok to get patient soup from [employer] or smoothies from  home; currently ok to bring in outside food to promote po intake  Hypophosphatemic; phosphorus binders on hold  EDW 64 kg as outpatient; current wt 60.7 kg. Pt unable to determine whether she has lost weight.  Noted EDW of 72.5 kg in  May 2022  Labs: phosphorus 1.2 (L) Meds: prednisone, florastor, miralax, mag ox, ss novolog, colace, ferrous sulfate, aranesp, sensipar, Vit D3, dulcolax   NUTRITION - FOCUSED PHYSICAL EXAM:  Flowsheet Row Most Recent Value  Orbital Region Moderate depletion  Upper Arm Region Mild depletion  Thoracic and Lumbar Region Mild depletion  Buccal Region Moderate depletion  Temple Region Moderate depletion  Clavicle Bone Region Moderate depletion  Clavicle and Acromion Bone Region Moderate depletion  Scapular Bone Region Moderate depletion  Dorsal Hand Mild depletion  Patellar Region Severe depletion  Anterior Thigh Region Severe depletion  Posterior Calf Region Severe depletion       Diet Order:   Diet Order             DIET DYS 2 Room service appropriate? Yes; Fluid consistency: Thin; Fluid restriction: 1200 mL Fluid  Diet effective now                   EDUCATION NEEDS:   Education needs have been addressed  Skin:  Skin Assessment: Reviewed RN Assessment  Last BM:  8/11  Height:   Ht Readings from Last 1 Encounters:  09/13/20 '5\' 2"'$  (1.575 m)    Weight:   Wt Readings from Last 1 Encounters:  09/20/20 60.7 kg     BMI:  Body mass index is 24.48 kg/m.  Estimated Nutritional Needs:   Kcal:  1800-2100 kcals  Protein:  90-110 g  Fluid:  1000 mL plus UOP   [employer]  MS, RDN, LDN, CNSC Registered Dietitian III Clinical Nutrition RD Pager and On-Call Pager Number Located in [place]

## 2020-09-20 NOTE — Progress Notes (Signed)
[facility] DAILY ICU PROGRESS NOTE                   [place].[address]            [place] [address]          [phone]   7 Days Post-Op Procedure(s) (LRB): CORONARY ARTERY BYPASS GRAFTING (CABG), ON PUMP, TIMES TWO, USING LEFT INTERNAL MAMMARY ARTERY AND RIGHT ENDOSCOPICALLY HARVESTED GREATER SAPHENOUS VEIN (N/A) TRANSESOPHAGEAL ECHOCARDIOGRAM (TEE) (N/A)  Total Length of Stay:  LOS: 12 days   Subjective: Feeling a little better  Objective: Vital signs in last 24 hours: Temp:  [98.2 F (36.8 C)-99.4 F (37.4 C)] 99.4 F (37.4 C) (08/11 0330) Pulse Rate:  [52-95] 85 (08/11 0600) Cardiac Rhythm: Normal sinus rhythm (08/11 0400) Resp:  [12-29] 29 (08/11 0600) BP: (114-195)/(65-133) 154/73 (08/11 0600) SpO2:  [94 %-100 %] 100 % (08/11 0600) Weight:  [60.7 kg-65.1 kg] 60.7 kg (08/11 0500)  Filed Weights   09/19/20 1455 09/19/20 1700 09/20/20 0500  Weight: 65.1 kg 63.7 kg 60.7 kg    Weight change:    Hemodynamic parameters for last 24 hours:    Intake/Output from previous day: 08/10 0701 - 08/11 0700 In: 39.3 [I.V.:39.3] Out: 1800 [Emesis/NG output:400]  Intake/Output this shift: No intake/output data recorded.  Current Meds: Scheduled Meds:  allopurinol  100 mg Oral QPM   amiodarone  200 mg Oral BID   amLODipine  10 mg Oral Daily   aspirin EC  325 mg Oral Daily   Or   aspirin  324 mg Per Tube Daily   bisacodyl  10 mg Oral Daily   Or   bisacodyl  10 mg Rectal Daily   chlorhexidine  15 mL Mouth Rinse BID   Chlorhexidine Gluconate Cloth  6 each Topical Daily   cholecalciferol  1,000 Units Oral QPM   [START ON 09/21/2020] cinacalcet  30 mg Oral Q M,W,F-HD   darbepoetin (ARANESP) injection - DIALYSIS  100 mcg Intravenous Q Wed-HD   docusate sodium  200 mg Oral Daily   enoxaparin (LOVENOX) injection  30 mg Subcutaneous Q24H   feeding supplement (NEPRO CARB STEADY)  237 mL Oral BID BM   ferrous sulfate  325 mg Oral Once per day on Mon Wed Fri    fluticasone  1 spray Each Nare Daily   folic acid  1 mg Oral Daily   insulin aspart  0-24 Units Subcutaneous Q4H   irbesartan  150 mg Oral Daily   magnesium oxide  200 mg Oral Daily   mouth rinse  15 mL Mouth Rinse q12n4p   metoprolol tartrate  25 mg Oral BID   montelukast  10 mg Oral Q supper   nystatin  5 mL Oral QID   pantoprazole  40 mg Oral BID   polyethylene glycol  17 g Oral Daily   polyvinyl alcohol  1 drop Both Eyes TID AC & HS   predniSONE  5 mg Oral Q breakfast   saccharomyces boulardii  250 mg Oral Daily   sodium chloride flush  3 mL Intravenous Q12H   umeclidinium bromide  1 puff Inhalation Daily   Continuous Infusions:  sodium chloride     sodium chloride     sodium phosphate  Dextrose 5% IVPB 30 mmol (09/20/20 0703)   PRN Meds:.sodium chloride, sodium chloride, albuterol, alteplase, fentaNYL (SUBLIMAZE) injection, food thickener, gabapentin, heparin, heparin, heparin, lidocaine-prilocaine, ondansetron (ZOFRAN) IV, oxyCODONE, pentafluoroprop-tetrafluoroeth, sodium chloride flush  General appearance: alert, cooperative,  fatigued, and no distress Heart: regular rate and rhythm Lungs: mildly dim in bases Abdomen: soft, non tender, mild distension Extremities: no edema, redness or tenderness in the calves or thighs Wound: incis healing well  Lab Results: CBC: Recent Labs    09/19/20 0444 09/20/20 0053  WBC 9.7 10.3  HGB 9.3* 8.3*  HCT 27.4* 23.8*  PLT 114* 109*   BMET:  Recent Labs    09/19/20 0641 09/20/20 0053  NA 134* 134*  K 3.8 4.0  CL 98 97*  CO2 26 27  GLUCOSE 100* 85  BUN 27* 26*  CREATININE 5.34* 4.10*  CALCIUM 9.8 9.2    CMET: Lab Results  Component Value Date   WBC 10.3 09/20/2020   HGB 8.3 (L) 09/20/2020   HCT 23.8 (L) 09/20/2020   PLT 109 (L) 09/20/2020   GLUCOSE 85 09/20/2020   CHOL 193 09/10/2020   TRIG 60 09/10/2020   HDL 69 09/10/2020   LDLCALC 112 (H) 09/10/2020   ALT 9 09/13/2020   AST 16 09/13/2020   NA 134 (L)  09/20/2020   K 4.0 09/20/2020   CL 97 (L) 09/20/2020   CREATININE 4.10 (H) 09/20/2020   BUN 26 (H) 09/20/2020   CO2 27 09/20/2020   TSH 4.375 07/03/2020   INR 1.3 (H) 09/13/2020   HGBA1C 5.2 09/13/2020      PT/INR: No results for input(s): LABPROT, INR in the last 72 hours. Radiology: No results found.   Assessment/Plan: S/P Procedure(s) (LRB): CORONARY ARTERY BYPASS GRAFTING (CABG), ON PUMP, TIMES TWO, USING LEFT INTERNAL MAMMARY ARTERY AND RIGHT ENDOSCOPICALLY HARVESTED GREATER SAPHENOUS VEIN (N/A) TRANSESOPHAGEAL ECHOCARDIOGRAM (TEE) (N/A)  1 Tmax 99.4,VSS, some intermit afib- had some RVR during dialysis yesterday- current sinus rhythm on amio and beta blocker. BP control a bit better but remains HTN at times- will increase ARB dose 2 HD- per nephrology - dialysis cut short yest d/t HR /BP issues 3 conts with nausea/ vomiting issues at times, cont BID protonix 4 BS control adequate 5 H/H has dropped, will  need to get GI involved at this point 6 thrombocytopenia- relatively stable 7 cont pulm toilet rehab [name] PA-C 09/20/2020 7:29 AM

## 2020-09-20 NOTE — Progress Notes (Signed)
Patient ID: Jodi Bell, female   DOB: 05-10-1960, 60 y.o.   MRN: [id number]  [facility] Progress Note   Assessment/ Plan:   1.  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    2. ESRD: On hemodialysis on a Monday/Wednesday/Friday schedule as an outpatient  - Continue HD per her MWF schedule - next tx on Friday  3. Anemia CKD: continue ESA  - She got aranesp 100 mcg on 8/10 after the dose was missed the preceding week.  She had been getting outpatient and tolerating  4. HTN -  on 8/10 changed to home med labetalol (ok with CTS). (reported home dose is 400 mg TID).  Given issues with tachycardia changing back to metoprolol.  See irbesartan 150 mg daily started on 8/9 (home med olmesartan 40 mg daily - not on formulary here and per pharmacy equivalent would be irbesartan 300 mg daily).  She is not actually on clonidine at home per her report.  Charted as below her EDW if last weight of 60.7 is accurate - her weight may have changed during her hospitalization.  With her nausea she may not tolerate this weight.   5. Tachycardia/? afib - amio per primary team.  Changing back to metoprolol and increasing dose to 25 mg BID - may need higher.   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  Replete phos today. On sensipar - change to MWF with HD   7.  Acute neurological changes: possible seizure and  MRI deferred per neurology.  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  Per charting improved after HD  Subjective:   Last HD on 8/10 with 1.4 kg UF.  Tx terminated after 2 hours due to tachycardia.  Feels better now.  HR now 87 and NSR on monitor.  Per nursing had emesis 400 ml recently- they are updating team.   Review of systems:  Denies shortness of breath or chest pain Some nausea with emesis   Objective:   BP (!) 146/72   Pulse 84   Temp 99.4 F (37.4 C) (Oral)   Resp 14   Ht 5'  2" (1.575 m)   Wt 60.7 kg   SpO2 100%   BMI 24.48 kg/m   Physical Exam:    General adult female in bed in no acute distress HEENT normocephalic atraumatic sclera anicteric Neck supple trachea midline Lungs clear to auscultation bilaterally normal work of breathing at rest on room air Heart S1S2 no rub Abdomen soft nontender nondistended Extremities no edema  Psych normal mood and affect Neuro - alert and oriented x 3 provides hx and follows commands Access LUE AVF bruit and thrill   Labs: BMET Recent Labs  Lab 09/13/20 1940 09/13/20 2009 09/14/20 1710 09/15/20 0440 09/16/20 0405 09/17/20 0655 09/17/20 1014 09/18/20 0312 09/19/20 0641 09/20/20 0053  NA 134*   < > 132* 130* 133* 131* 134* 133* 134* 134*  K 3.7   < > 4.8 5.0 4.2 4.1 2.9* 3.4* 3.8 4.0  CL 100   < > 98 96* 97* 97*  --  96* 98 97*  CO2 25   < > '24 24 27 23  '$ --  '29 26 27  '$ GLUCOSE 84   < > 76 88 91 106*  --  106* 100* 85  BUN 13   < > 20 25* 21* 31*  --  19 27* 26*  CREATININE 3.42*   < >  4.72* 5.26* 4.28* 5.41*  --  4.01* 5.34* 4.10*  CALCIUM 8.8*   < > 9.8 9.7 9.3 9.2  --  9.2 9.8 9.2  PHOS 3.2  --   --   --   --  3.2  3.3  --   --  1.9* 1.2*   < > = values in this interval not displayed.   CBC Recent Labs  Lab 09/17/20 0655 09/17/20 1014 09/18/20 0312 09/19/20 0444 09/20/20 0053  WBC 8.7  --  7.0 9.7 10.3  HGB 9.8* 11.2* 9.0* 9.3* 8.3*  HCT 30.0* 33.0* 26.5* 27.4* 23.8*  MCV 90.1  --  88.0 87.0 85.6  PLT 114*  --  96* 114* 109*      Medications:     allopurinol  100 mg Oral QPM   amiodarone  200 mg Oral BID   amLODipine  10 mg Oral Daily   aspirin EC  325 mg Oral Daily   Or   aspirin  324 mg Per Tube Daily   bisacodyl  10 mg Oral Daily   Or   bisacodyl  10 mg Rectal Daily   chlorhexidine  15 mL Mouth Rinse BID   Chlorhexidine Gluconate Cloth  6 each Topical Daily   cholecalciferol  1,000 Units Oral QPM   cinacalcet  30 mg Oral Q T,Th,Sa-HD   darbepoetin (ARANESP) injection -  DIALYSIS  100 mcg Intravenous Q Wed-HD   docusate sodium  200 mg Oral Daily   enoxaparin (LOVENOX) injection  30 mg Subcutaneous Q24H   feeding supplement (NEPRO CARB STEADY)  237 mL Oral BID BM   ferrous sulfate  325 mg Oral Once per day on Mon Wed Fri   fluticasone  1 spray Each Nare Daily   folic acid  1 mg Oral Daily   insulin aspart  0-24 Units Subcutaneous Q4H   irbesartan  150 mg Oral Daily   labetalol  200 mg Oral TID   magnesium oxide  200 mg Oral Daily   mouth rinse  15 mL Mouth Rinse q12n4p   montelukast  10 mg Oral Q supper   nystatin  5 mL Oral QID   pantoprazole  40 mg Oral BID   polyethylene glycol  17 g Oral Daily   polyvinyl alcohol  1 drop Both Eyes TID AC & HS   predniSONE  5 mg Oral Q breakfast   saccharomyces boulardii  250 mg Oral Daily   sodium chloride flush  3 mL Intravenous Q12H   umeclidinium bromide  1 puff Inhalation Daily   Outpatient HD orders  OP HD: MWF [facility])  3.5h   3.0/2.5 bath   Hep 3500 bolus and 500 main LUA AVF   400/600 EDW 64 kg (and she was getting to this) ESA: aranesp 90 mcg weekly Other meds: not on calcitriol Allergic to Ferrlecit (no reaction is provided by unit)  Claudia Desanctis, MD 09/20/2020, 6:39 AM

## 2020-09-20 NOTE — Progress Notes (Signed)
      [address]       [place] [address]             [phone]                 7 Days Post-Op Procedure(s) (LRB): CORONARY ARTERY BYPASS GRAFTING (CABG), ON PUMP, TIMES TWO, USING LEFT INTERNAL MAMMARY ARTERY AND RIGHT ENDOSCOPICALLY HARVESTED GREATER SAPHENOUS VEIN (N/A) TRANSESOPHAGEAL ECHOCARDIOGRAM (TEE) (N/A)   Events: Blood bowel movement X 2 GI on board _______________________________________________________________ Vitals: BP (!) 153/66   Pulse 80   Temp 99 F (37.2 C) (Oral)   Resp (!) 21   Ht '5\' 2"'$  (1.575 m)   Wt 60.7 kg   SpO2 100%   BMI 24.48 kg/m   - Neuro: awake, NAD  - Cardiovascular: sinus  Drips: none.      - Pulm: EOWB    ABG    Component Value Date/Time   PHART 7.439 09/17/2020 1014   PCO2ART 43.7 09/17/2020 1014   PO2ART 60 (L) 09/17/2020 1014   HCO3 29.6 (H) 09/17/2020 1014   TCO2 31 09/17/2020 1014   ACIDBASEDEF 1.0 09/13/2020 2152   O2SAT 91.0 09/17/2020 1014    - Abd: ND - Extremity: warm  .Intake/Output      08/10 0701 08/11 0700 08/11 0701 08/12 0700   P.O.     I.V. (mL/kg) 39.3 (0.6) 18.3 (0.3)   IV Piggyback  280.2   Total Intake(mL/kg) 39.3 (0.6) 298.5 (4.9)   Emesis/NG output 400    Other 1400    Total Output 1800    Net -1760.7 +298.5        Stool Occurrence  5 x      _______________________________________________________________ Labs: CBC Latest Ref Rng & Units 09/20/2020 09/20/2020 09/19/2020  WBC 4.0 - 10.5 K/uL - 10.3 9.7  Hemoglobin 12.0 - 15.0 g/dL 7.9(L) 8.3(L) 9.3(L)  Hematocrit 36.0 - 46.0 % 24.3(L) 23.8(L) 27.4(L)  Platelets 150 - 400 K/uL - 109(L) 114(L)   CMP Latest Ref Rng & Units 09/20/2020 09/19/2020 09/18/2020  Glucose 70 - 99 mg/dL 85 100(H) 106(H)  BUN 6 - 20 mg/dL 26(H) 27(H) 19  Creatinine 0.44 - 1.00 mg/dL 4.10(H) 5.34(H) 4.01(H)  Sodium 135 - 145 mmol/L 134(L) 134(L) 133(L)  Potassium 3.5 - 5.1 mmol/L 4.0 3.8 3.4(L)  Chloride 98 - 111 mmol/L 97(L) 98 96(L)  CO2 22  - 32 mmol/L '27 26 29  '$ Calcium 8.9 - 10.3 mg/dL 9.2 9.8 9.2  Total Protein 6.5 - 8.1 g/dL - - -  Total Bilirubin 0.3 - 1.2 mg/dL - - -  Alkaline Phos 38 - 126 U/L - - -  AST 15 - 41 U/L - - -  ALT 0 - 44 U/L - - -    CXR: Smal right apical pneumoT  _______________________________________________________________  Assessment and Plan: POD 7 s/p CABG GI bleed  GI ordered bleeding scan.  Neuro: mental status much improved CV: stable Pulm: continue pulm toilet Renal: on HD GI: awaiting bleeding scan.  Will discuss need for endoscopy with GI.  On protonix gtt Heme: transfused.  Will continue to follow H/H ID: afebrile Endo: SSI Dispo: continue ICU care for GI bleed   Lajuana Matte 09/20/2020 5:01 PM

## 2020-09-20 NOTE — Procedures (Signed)
Central Venous Catheter Insertion Procedure Note  HITOMI LYDEN  [id number]  02/18/1960  Date:09/20/20  Time:4:29 PM   Provider Performing:Ariya Bohannon Cleon Dew   Procedure: Insertion of Non-tunneled Central Venous [id number]) with US guidance [id number])   Indication(s) Medication administration and Difficult access  Consent Risks of the procedure as well as the alternatives and risks of each were explained to the patient and/or caregiver.  Consent for the procedure was obtained and is signed in the bedside chart  Anesthesia Topical only with 1% lidocaine   Timeout Verified patient identification, verified procedure, site/side was marked, verified correct patient position, special equipment/implants available, medications/allergies/relevant history reviewed, required imaging and test results available.  Sterile Technique Maximal sterile technique including full sterile barrier drape, hand hygiene, sterile gown, sterile gloves, mask, hair covering, sterile ultrasound probe cover (if used).  Procedure Description Area of catheter insertion was cleaned with chlorhexidine and draped in sterile fashion.  With real-time ultrasound guidance a central venous catheter was placed into the left internal jugular vein. Nonpulsatile blood flow and easy flushing noted in all ports.  The catheter was sutured in place and sterile dressing applied.  A picture of the [place] line placement was obtained but lost when the [place] machine turned off.  Complications/Tolerance None; patient tolerated the procedure well. Chest X-ray is ordered to verify placement for internal jugular or subclavian cannulation.   Chest x-ray is not ordered for femoral cannulation.  EBL Minimal  Specimen(s) None  [employer] MSN, APRN, AGACNP-BC [facility]  09/20/2020 , 4:31 PM  Please see [email or web address] for pager details  If no response, please call [phone] After hours, please call  [place] at [phone]

## 2020-09-20 NOTE — Progress Notes (Signed)
When patient arrived back to 2 Heart, RN noticed CVL dressing was peeled off with line/sutures still intact. RN changed dressing and when flushing ports testing for line patency, RN noticed the blue port had a puncture site in the lumen. RN called [facility] at 2128 and spoke with Dr. Emmit Alexanders who ordered to leave in CVL, clamp the line below puncture site, and that he would consult CCM ground team to come assess. CCM came at 2315 to bedside and decided to replace the line by threading a guidewire into the same site. RN ordered not to remove CVL, and RN assisted CCM team at bedside to insert new line. CXR ordered and RN cleared by Dr. Carson Myrtle to use.

## 2020-09-20 NOTE — Consult Note (Signed)
Referring Provider: Cardiothoracic surgery Primary Care Physician:  [name]., MD Primary Gastroenterologist:  [facility]  Reason for Consultation: Anemia  HPI: Jodi Bell is a 60 y.o. female with past medical history of end-stage renal disease on dialysis, refractory hypertension, history of CHF, diabetes, diabetic neuropathy, COPD, rheumatoid arthritis on chronic steroids, coronary artery disease presented to the hospital with chest pain and shortness of breath on September 08, 2020.  Upon initial evaluation, she was found to have elevated troponins and was subsequently diagnosed with NSTEMI and multivessel coronary artery disease.  She underwent CABG X 2 on September 13, 2020.  She subsequently developed altered mental status and seizure-like activity on September 15, 2020.  Neurological work-up was negative.  She was found to have mild drop in hemoglobin.  GI is consulted for further evaluation. Hemoglobin on admission on September 09, 2020 was 7.5.  Hemoglobin yesterday was 9.3.  Hemoglobin today is 8.3.  Patient seen and examined at bedside.  Family at bedside.  She had 1 episode of vomiting last night.  She denies abdominal pain.  Bowel movement today showed dark stool surrounded by streaks of fresh blood.  Previous GI work-up  Patient was admitted to the hospital 04/2019 at [facility].  She had EGD and colonoscopy at that time.  EGD showed gastritis.  Biopsies were negative for H. pylori.  Colonoscopy showed tubular adenoma.  Repeat colonoscopy was recommended in 3 years with 2-day prep. -Full report not available to review.  EGD in April 2021 showed esophagitis, esophageal ulcer, scattered white plaques in the esophagus and duodenal AVM which was treated with APC.  Esophageal biopsies showed acute esophagitis , negative for Candida esophagitis.  -Capsule endoscopy 05/2019 showed few erosions and one small nonbleeding AVM in the proximal jejunum.  No active bleeding.    - EGD in  August 2019 by Dr. Penelope Coop for evaluation of anemia showed small adenopathy otherwise normal.   - Colonoscopy in August 2019 showed multiple ulcers in the proximal to mid transverse colon.  It was treated with epinephrine injection and clips placement.    Past Medical History:  Diagnosis Date   Anemia of chronic disease    Anxiety    Arthritis    oa and psoriatic ra   Asthma    Back pain, chronic    lower back   Candida infection finished tx 2 days ago   Chronic insomnia    COPD (chronic obstructive pulmonary disease) (HCC)    Coronary artery calcification seen on CT scan    Diabetes mellitus    type 2   Eczema    ESRD on dialysis [facility])    Essential hypertension    GERD (gastroesophageal reflux disease)    Gout    History of hiatal hernia    Hypoalbuminemia    Hyponatremia    Mild aortic stenosis    Mitral regurgitation    Mitral stenosis    Obesity    PAD (peripheral artery disease) ([place])    a. externial iliac calcification seen on CT 04/2020.   Pneumonia few yrs ago x 2   Sleep apnea    Tobacco abuse    Upper GI bleed 05/2019    Past Surgical History:  Procedure Laterality Date   BACK SURGERY  2016   lower back fusion with cage   BIOPSY  09/23/2017   Procedure: BIOPSY;  Surgeon: Wonda Horner, MD;  Location: [facility];  Service: Endoscopy;;   BIOPSY  05/19/2019  Procedure: BIOPSY;  Surgeon: Otis Brace, MD;  Location: [place];  Service: Gastroenterology;;   CESAREAN SECTION  1990   x 1    COLONOSCOPY WITH PROPOFOL N/A 09/23/2017   Procedure: COLONOSCOPY WITH PROPOFOL Hemostatic clips placed;  Surgeon: Wonda Horner, MD;  [facility]: [facility];  Service: Endoscopy;  Laterality: N/A;   CORONARY ARTERY BYPASS GRAFT N/A 09/13/2020   Procedure: CORONARY ARTERY BYPASS GRAFTING (CABG), ON PUMP, TIMES TWO, USING LEFT INTERNAL MAMMARY ARTERY AND RIGHT ENDOSCOPICALLY HARVESTED GREATER SAPHENOUS VEIN;  Surgeon: Gaye Pollack, MD;  Location: [place];  Service: Open  Heart Surgery;  Laterality: N/A;   DILATION AND CURETTAGE OF UTERUS  1988   ESOPHAGOGASTRODUODENOSCOPY (EGD) WITH PROPOFOL N/A 09/23/2017   Procedure: ESOPHAGOGASTRODUODENOSCOPY (EGD) WITH PROPOFOL;  Surgeon: Wonda Horner, MD;  [facility];  Service: Endoscopy;  Laterality: N/A;   ESOPHAGOGASTRODUODENOSCOPY (EGD) WITH PROPOFOL N/A 05/19/2019   Procedure: ESOPHAGOGASTRODUODENOSCOPY (EGD) WITH PROPOFOL;  Surgeon: Otis Brace, MD;  Location: [place];  Service: Gastroenterology;  Laterality: N/A;   GIVENS CAPSULE STUDY N/A 05/19/2019   Procedure: GIVENS CAPSULE STUDY;  Surgeon: Otis Brace, MD;  Location: [place];  Service: Gastroenterology;  Laterality: N/A;   HOT HEMOSTASIS N/A 05/19/2019   Procedure: HOT HEMOSTASIS (ARGON PLASMA COAGULATION/BICAP);  Surgeon: Otis Brace, MD;  Location[facility];  Service: Gastroenterology;  Laterality: N/A;   LEFT HEART CATH AND CORONARY ANGIOGRAPHY N/A 09/10/2020   Procedure: LEFT HEART CATH AND CORONARY ANGIOGRAPHY;  Surgeon: Nelva Bush, MD;  Location: [place] CV LAB;  Service: Cardiovascular;  Laterality: N/A;   PARTIAL KNEE ARTHROPLASTY Left 11/12/2017   Procedure: left unicompartmental arthroplasty-medial;  Surgeon: Paralee Cancel, MD;  [facility];  Service: Orthopedics;  Laterality: Left;  13mn   SUBMUCOSAL INJECTION  09/23/2017   Procedure: SUBMUCOSAL INJECTION;  Surgeon: GWonda Horner MD;  [facility];  Service: Endoscopy;;  in colon   TEE WITHOUT CARDIOVERSION N/A 09/13/2020   Procedure: TRANSESOPHAGEAL ECHOCARDIOGRAM (TEE);  Surgeon: BGaye Pollack MD;  Location[place]  Service: Open Heart Surgery;  Laterality: N/A;   TUBAL LIGATION  1990    Prior to Admission medications   Medication Sig Start Date End Date Taking? Authorizing Provider  acetaminophen (TYLENOL) 500 MG tablet Take 1,000 mg by mouth every 6 (six) hours as needed for mild pain.   Yes [provider]  albuterol  (PROVENTIL) (2.5 MG/3ML) 0.083% nebulizer solution Take 2.5 mg by nebulization as needed for wheezing or shortness of breath. 04/15/19  Yes [provider]  allopurinol (ZYLOPRIM) 100 MG tablet Take 100 mg by mouth every evening.   Yes [provider]  amLODipine (NORVASC) 10 MG tablet Take 10 mg by mouth daily.   Yes [provider]  calcium acetate (PHOSLO) 667 MG capsule Take 3 capsules by mouth in the morning and at bedtime. Take 2 capsules with snacks. 10/25/18  Yes [provider]  carboxymethylcellulose 1 % ophthalmic solution Place 1 drop into both eyes 4 (four) times daily.   Yes [provider]  cetirizine (ZYRTEC) 10 MG tablet Take 10 mg by mouth 2 (two) times daily as needed (hives).  06/13/17  Yes WShelda Pal DO  Cholecalciferol (VITAMIN D3) 10 MCG (400 UNIT) tablet Take 400 Units by mouth every evening.   Yes [provider]  cloNIDine (CATAPRES) 0.3 MG tablet Take 1 tablet (0.3 mg total) by mouth 3 (three) times daily. 05/21/19  Yes AHosie Poisson MD  cyclobenzaprine (FConfluence  10 MG tablet Take 10 mg by mouth 3 (three) times daily as needed for muscle spasms. 06/08/20  Yes [provider]  ELDERBERRY PO Take 2 drops by mouth daily.   Yes [provider]  ferrous sulfate 325 (65 FE) MG tablet Take 325 mg by mouth 3 (three) times a week. TUE THUR SAT   Yes [provider]  fluticasone (FLONASE) 50 MCG/ACT nasal spray Place 1 spray into both nostrils daily.   Yes [provider]  folic acid (FOLVITE) Q000111Q MCG tablet Take 400 mcg by mouth daily.    Yes [provider]  furosemide (LASIX) 40 MG tablet Take 40 mg by mouth 2 (two) times daily.    Yes [provider]  gabapentin (NEURONTIN) 300 MG capsule Take 1 capsule (300 mg total) by mouth at bedtime as needed (Neuropathic pain). Patient taking differently: Take 300 mg by mouth as needed (pain). 12/14/17 10/20/20 Yes Dana Allan I, MD  hydrALAZINE (APRESOLINE) 100 MG tablet Take 1 tablet (100 mg total) by mouth every 8 (eight) hours. 07/06/20  Yes [name], MD  isosorbide mononitrate (IMDUR) 60 MG 24 hr tablet Take 1 tablet (60 mg total) by mouth daily. 03/02/18  Yes Thurnell Lose, MD  labetalol (NORMODYNE) 200 MG tablet Take 400 mg by mouth 3 (three) times daily.   Yes [provider]  LORazepam (ATIVAN) 1 MG tablet Take 1 mg by mouth every 8 (eight) hours as needed for anxiety. 06/25/20  Yes [provider]  montelukast (SINGULAIR) 10 MG tablet Take 10 mg by mouth daily with supper.    Yes [provider]  multivitamin (RENA-VIT) TABS tablet Take 1 tablet by mouth daily with supper.   Yes [provider]  nitroGLYCERIN (NITROSTAT) 0.4 MG SL tablet Place 0.4 mg under the tongue every 5 (five) minutes as needed for chest pain. 08/01/20  Yes [provider]  olmesartan (BENICAR) 40 MG tablet Take 40 mg by mouth every evening. 05/06/19  Yes [provider]  oxyCODONE (ROXICODONE) 15 MG immediate release tablet Take 15 mg by mouth 3 (three) times daily as needed for pain. 05/31/20  Yes [provider]  pantoprazole (PROTONIX) 40 MG tablet Take 40 mg by mouth daily. 06/09/20  Yes [provider]  predniSONE (DELTASONE) 5 MG tablet Take 1 tablet (5 mg total) by mouth daily with breakfast. 07/06/20  Yes [name], MD  Probiotic Product (PROBIOTIC PO) Take 1 capsule by mouth daily.   Yes [provider]  Tiotropium Bromide Monohydrate (SPIRIVA RESPIMAT IN) Inhale 2 puffs into the lungs daily.   Yes [provider]  VENTOLIN HFA 108 (90 Base) MCG/ACT inhaler Inhale 1-2 puffs into the lungs every 6 (six) hours as needed for shortness of breath. wheezing 06/13/15  Yes [provider]  XELJANZ 5 MG TABS Take 5 mg by mouth at bedtime. 05/17/19  Yes [provider]  zolpidem (AMBIEN) 5 MG tablet Take 5 mg by mouth  at bedtime. 05/05/19  Yes [provider]  cinacalcet (SENSIPAR) 30 MG tablet Take 30 mg by mouth 3 (three) times a week. Tuesday, Thursday, Saturday. Patient not taking: Reported on 09/09/2020    [provider]  EPINEPHrine (EPIPEN) 0.3 mg/0.3 mL SOAJ injection Inject 0.3 mLs (0.3 mg total) into the muscle once. Patient taking differently: Inject 0.3 mg into the muscle as needed (for allergic reaction). 09/30/12   Le, Thao P, DO  JANUVIA 25 MG tablet Take 1 tablet (  25 mg total) by mouth daily. Patient not taking: Reported on 09/09/2020 07/06/20   [name], MD    Scheduled Meds:  allopurinol  100 mg Oral QPM   amiodarone  200 mg Oral BID   amLODipine  10 mg Oral Daily   aspirin EC  325 mg Oral Daily   Or   aspirin  324 mg Per Tube Daily   bisacodyl  10 mg Oral Daily   Or   bisacodyl  10 mg Rectal Daily   chlorhexidine  15 mL Mouth Rinse BID   Chlorhexidine Gluconate Cloth  6 each Topical Daily   cholecalciferol  1,000 Units Oral QPM   [START ON 09/21/2020] cinacalcet  30 mg Oral Q M,W,F-HD   darbepoetin (ARANESP) injection - DIALYSIS  100 mcg Intravenous Q Wed-HD   docusate sodium  200 mg Oral Daily   enoxaparin (LOVENOX) injection  30 mg Subcutaneous Q24H   feeding supplement (NEPRO CARB STEADY)  237 mL Oral BID BM   ferrous sulfate  325 mg Oral Once per day on Mon Wed Fri   fluticasone  1 spray Each Nare Daily   folic acid  1 mg Oral Daily   insulin aspart  0-24 Units Subcutaneous Q4H   irbesartan  300 mg Oral Daily   magnesium oxide  200 mg Oral Daily   mouth rinse  15 mL Mouth Rinse q12n4p   metoprolol tartrate  25 mg Oral BID   montelukast  10 mg Oral Q supper   nystatin  5 mL Oral QID   pantoprazole  40 mg Oral BID   polyethylene glycol  17 g Oral Daily   polyvinyl alcohol  1 drop Both Eyes TID AC & HS   predniSONE  5 mg Oral Q breakfast   saccharomyces boulardii  250 mg Oral Daily   sodium chloride flush  3 mL Intravenous Q12H   umeclidinium  bromide  1 puff Inhalation Daily   Continuous Infusions:  sodium chloride     sodium chloride     sodium phosphate  Dextrose 5% IVPB 43 mL/hr at 09/20/20 0800   PRN Meds:.sodium chloride, sodium chloride, albuterol, alteplase, fentaNYL (SUBLIMAZE) injection, food thickener, gabapentin, heparin, heparin, heparin, lidocaine-prilocaine, ondansetron (ZOFRAN) IV, oxyCODONE, pentafluoroprop-tetrafluoroeth, sodium chloride flush  Allergies as of 09/08/2020 - Review Complete 09/08/2020  Allergen Reaction Noted   3-methyl-2-benzothiazolinone hydrazone Other (See Comments) 09/07/2012   Banana Anaphylaxis and Swelling 01/21/2018   Black walnut flavor Anaphylaxis 05/18/2019   Hazelnut (filbert) allergy skin test Anaphylaxis 05/18/2019   Leflunomide and related Other (See Comments) 07/23/2012   Lisinopril Swelling 01/05/2014   No healthtouch food allergies Anaphylaxis 05/18/2019   Other Other (See Comments), Anaphylaxis, and Swelling 09/07/2012   Pecan extract allergy skin test Anaphylaxis 05/18/2019   Pecan nut (diagnostic) Anaphylaxis 05/18/2019   Trazodone and nefazodone Other (See Comments) 07/23/2014   Adalimumab Other (See Comments) 07/23/2012   Escitalopram oxalate Other (See Comments) 09/29/2013   Ezetimibe Other (See Comments) 01/13/2018   Secukinumab Swelling 05/18/2019   Statins Other (See Comments) 09/04/2014   Ibuprofen Other (See Comments) 09/04/2014    Family History  Problem Relation Age of Onset   Lung cancer Mother    Diabetes Sister    Heart disease Sister    Asthma Daughter    Arthritis Daughter    Arthritis Daughter    Thyroid cancer Other        1/2 sister   Heart disease Other        1/2  sister    Social History   Socioeconomic History   Marital status: Divorced    Spouse name: BASIL   Number of children: 3   Years of education: 1   Highest education level: GED or equivalent  Occupational History   Occupation: retired Corporate treasurer  Tobacco Use   Smoking status:  Every Day    Packs/day: 0.50    Years: 40.00    Pack years: 20.00    Types: Cigarettes   Smokeless tobacco: Never  Vaping Use   Vaping Use: Never used  Substance and Sexual Activity   Alcohol use: Yes    Comment: occ   Drug use: No   Sexual activity: Not on file  Other Topics Concern   Not on file  Social History Narrative   Not on file   Social Determinants of Health   Financial Resource Strain: Not on file  Food Insecurity: Not on file  Transportation Needs: Not on file  Physical Activity: Not on file  Stress: Not on file  Social Connections: Not on file  Intimate Partner Violence: Not on file    Review of Systems: All negative except as stated above in HPI.  Physical Exam: Vital signs: Vitals:   09/20/20 0806 09/20/20 0836  BP:  136/66  Pulse:  85  Resp:    Temp: 99.6 F (37.6 C)   SpO2:  100%   Last BM Date: 09/20/20 General:   Lethargic, sleepy, not in acute distress HEENT : Normocephalic, atraumatic, extraocular movement intact CHEST : Midline incision from recent bypass surgery noted Lungs: No respiratory distress Heart:  Regular rate and rhythm; no murmurs, clicks, rubs,  or gallops. Abdomen: Soft, nontender, nondistended, bowel sounds present. Neuro : responds to verbal command but somewhat lethargic Psych : Mood and affect normal Rectal:  Deferred  GI:  Lab Results: Recent Labs    09/18/20 0312 09/19/20 0444 09/20/20 0053  WBC 7.0 9.7 10.3  HGB 9.0* 9.3* 8.3*  HCT 26.5* 27.4* 23.8*  PLT 96* 114* 109*   BMET Recent Labs    09/18/20 0312 09/19/20 0641 09/20/20 0053  NA 133* 134* 134*  K 3.4* 3.8 4.0  CL 96* 98 97*  CO2 '29 26 27  '$ GLUCOSE 106* 100* 85  BUN 19 27* 26*  CREATININE 4.01* 5.34* 4.10*  CALCIUM 9.2 9.8 9.2   LFT Recent Labs    09/20/20 0053  ALBUMIN 1.9*   PT/INR No results for input(s): LABPROT, INR in the last 72 hours.   Studies/Results: No results found.  Impression/Plan: -GI bleed with dark-colored  stool surrounded by streaks of fresh blood.  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 -S/p CABG for NSTEMI on 09/13/2020  -End-stage renal disease on dialysis -Altered mental status. ?  Etiology . improving.  Recommendations ------------------------ -Start Protonix drip -Start Anusol suppository -If ongoing bleeding, consider bleeding scan or CTA for further evaluation.  -  She is not strong enough for colonoscopy prep a this time and her limited oral intake may be challenging to give her colonoscopy prep. -Monitor H&H.  Transfuse if hemoglobin less than 8. -Management options discussed with family at bedside.  They verbalized understanding. - GI will follow    LOS: 12 days   Otis Brace  MD, FACP 09/20/2020, 9:25 AM  Contact #  [phone]

## 2020-09-20 NOTE — Progress Notes (Signed)
OT Cancellation Note  Patient Details Name: Jodi Bell MRN: [id number] DOB: December 18, 1960   Cancelled Treatment:    Reason Eval/Treat Not Completed: Medical issues which prohibited therapy. Per MD note: "receiving 2 units of blood.  Recommend stat GI bleeding scan.  If bleeding scan positive, recommend IR consult for embolization.  Keep patient n.p.o. for now.-Monitor H&H."  OT to continue efforts as appropriate.   Nayelie Gionfriddo D Hearl Heikes 09/20/2020, 4:10 PM

## 2020-09-20 NOTE — Progress Notes (Addendum)
[facility] Progress Note Patient Name: LEANNY KELDER DOB: 01-06-1961 MRN: [id number]   Date of Service  09/20/2020  HPI/Events of Note  Nursing reports that the [facility] port of a central venous catheter that was placed by PCCM is leaking in mid clear plastic portion.   eICU Interventions  Plan: Nurse instructed to clamp it off proximal to leak and can use other 2 ports. She says it needs to be pulled and replaced. Instructed her not to pull until PCCM ground team can evaluate and either agree with me or replace catheter over a wire. PCCM ground team requested to evaluate leaking CVL at bedside.      Intervention Category Major Interventions: Other:  Lysle Dingwall 09/20/2020, 10:27 PM

## 2020-09-20 NOTE — Progress Notes (Signed)
-   Received call from RN.  Patient had 2 large bloody bowel movements now.  She will be receiving 2 units of blood. -Recommend stat GI bleeding scan.  If bleeding scan positive, recommend IR consult for embolization. -Keep patient n.p.o. for now -Monitor H&H.  Otis Brace MD, [place] 09/20/2020, 3:43 PM  Contact #  [phone]

## 2020-09-20 NOTE — Progress Notes (Signed)
  Speech Language Pathology Treatment: Dysphagia  Patient Details Name: Jodi Bell MRN: [id number] DOB: Sep 04, 1960 Today's Date: 09/20/2020 Time: [id number] SLP Time Calculation (min) (ACUTE ONLY): 15 min  Assessment / Plan / Recommendation Clinical Impression  Pt was sitting up in her chair, restless and wanting to get back to bed. She initially declined PO intake and per RN has been consuming very little if anything whenever POs are offered. She has been having episodes of emesis but when asked about her limited intake, she says, "have you seen the food here?" SLP provided advanced trials of regular solids but pt took over 10 minutes to eat less than half of a cracker, needing cues to clear her oral cavity (primarily the L side of her tongue). Liquid washes were provided too. She does ultimately clear her mouth, but then promptly says that this was too much work. No overt s/s of aspiration were noted with solids or thin liquids today, so did discuss potential advancement to soft diet with her. However, she thinks that she does not have the energy for this yet. She also reported transient nausea following only a few bites of cracker. Recommend maintaining Dys 2 (finely chopped) food and thin liquids. Her potential for solid advancement is very good as her energy increases.       HPI HPI: 60 y.o. female who presented with sudden onset of SOB. Workup for CHF decompensaiton, COPD exacerbation, HTN urgency, NSTEMI; need for emergent HD. S/p LHC 8/1 which showed severe two-vessel CAD. CABGx2 8/4. Post-procedure had seizure-like activity and left-sided weakness. CTH was negative. PMH includes: ESRD on HD MWF, refractory HTN, chronic diastolic CHF, IIDM, diabetic neuropathy, COPD, cigarette smoker, RA on chronic steroid, CAD, PAD, chronic anemia      SLP Plan  Continue with current plan of care       Recommendations  Diet recommendations: Dysphagia 2 (fine chop);Thin liquid Liquids provided via:  Cup;Straw Medication Administration: Whole meds with puree Supervision: Staff to assist with self feeding Compensations: Minimize environmental distractions;Slow rate;Small sips/bites Postural Changes and/or Swallow Maneuvers: Seated upright 90 degrees;Upright 30-60 min after meal                Oral Care Recommendations: Oral care BID Follow up Recommendations: Skilled Nursing facility SLP Visit Diagnosis: Dysphagia, oral phase (R13.11);Dysphagia, unspecified (R13.10) Plan: Continue with current plan of care       GO                Osie Bond., M.A. [facility] Pager [phone] Office [phone]  09/20/2020, 9:37 AM

## 2020-09-21 DIAGNOSIS — L8990 Pressure ulcer of unspecified site, unspecified stage: Secondary | ICD-10-CM | POA: Insufficient documentation

## 2020-09-21 LAB — RENAL FUNCTION PANEL
Albumin: 1.7 g/dL — ABNORMAL LOW (ref 3.5–5.0)
Anion gap: 9 (ref 5–15)
BUN: 61 mg/dL — ABNORMAL HIGH (ref 6–20)
CO2: 27 mmol/L (ref 22–32)
Calcium: 8.3 mg/dL — ABNORMAL LOW (ref 8.9–10.3)
Chloride: 99 mmol/L (ref 98–111)
Creatinine, Ser: 5.25 mg/dL — ABNORMAL HIGH (ref 0.44–1.00)
GFR, Estimated: 9 mL/min — ABNORMAL LOW (ref >60)
Glucose, Bld: 74 mg/dL (ref 70–99)
Phosphorus: 2.6 mg/dL (ref 2.5–4.6)
Potassium: 4.5 mmol/L (ref 3.5–5.1)
Sodium: 135 mmol/L (ref 135–145)

## 2020-09-21 LAB — CBC
HCT: 25 % — ABNORMAL LOW (ref 36.0–46.0)
Hemoglobin: 8.2 g/dL — ABNORMAL LOW (ref 12.0–15.0)
MCH: 28.2 pg (ref 26.0–34.0)
MCHC: 32.8 g/dL (ref 30.0–36.0)
MCV: 85.9 fL (ref 80.0–100.0)
Platelets: 84 10*3/uL — ABNORMAL LOW (ref 150–400)
RBC: 2.91 MIL/uL — ABNORMAL LOW (ref 3.87–5.11)
RDW: 22.3 % — ABNORMAL HIGH (ref 11.5–15.5)
WBC: 8.9 10*3/uL (ref 4.0–10.5)
nRBC: 0 % (ref 0.0–0.2)

## 2020-09-21 LAB — GLUCOSE, CAPILLARY
Glucose-Capillary: 124 mg/dL — ABNORMAL HIGH (ref 70–99)
Glucose-Capillary: 132 mg/dL — ABNORMAL HIGH (ref 70–99)
Glucose-Capillary: 74 mg/dL (ref 70–99)
Glucose-Capillary: 79 mg/dL (ref 70–99)
Glucose-Capillary: 80 mg/dL (ref 70–99)
Glucose-Capillary: 82 mg/dL (ref 70–99)
Glucose-Capillary: 96 mg/dL (ref 70–99)

## 2020-09-21 LAB — HEMOGLOBIN AND HEMATOCRIT, BLOOD
HCT: 24.8 % — ABNORMAL LOW (ref 36.0–46.0)
Hemoglobin: 8.5 g/dL — ABNORMAL LOW (ref 12.0–15.0)

## 2020-09-21 MED ORDER — METOPROLOL TARTRATE 50 MG PO TABS
50.0000 mg | ORAL_TABLET | Freq: Two times a day (BID) | ORAL | Status: DC
  Administered 2020-09-21 – 2020-09-23 (×6): 50 mg via ORAL
  Filled 2020-09-21 (×6): qty 1

## 2020-09-21 MED ORDER — LABETALOL HCL 5 MG/ML IV SOLN
20.0000 mg | INTRAVENOUS | Status: DC | PRN
  Administered 2020-09-21 – 2020-09-24 (×8): 20 mg via INTRAVENOUS
  Filled 2020-09-21 (×8): qty 4

## 2020-09-21 MED ORDER — SODIUM CHLORIDE 0.9% FLUSH
10.0000 mL | Freq: Two times a day (BID) | INTRAVENOUS | Status: DC
  Administered 2020-09-21 – 2020-09-24 (×7): 10 mL

## 2020-09-21 MED ORDER — GERHARDT'S BUTT CREAM
TOPICAL_CREAM | Freq: Two times a day (BID) | CUTANEOUS | Status: DC
  Administered 2020-09-22 – 2020-10-01 (×4): 1 via TOPICAL
  Filled 2020-09-21: qty 1

## 2020-09-21 MED ORDER — SODIUM CHLORIDE 0.9% FLUSH: 10.0000 mL | INTRAVENOUS | Status: DC | PRN

## 2020-09-21 NOTE — Progress Notes (Addendum)
[facility] Gastroenterology Progress Note  Jodi Bell 60 y.o. [birth year]  CC: GI bleed   Subjective: Patient seen and examined at bedside.  Yesterday's events noted.  Had to bloody bowel movements yesterday.  Follow-up GI bleeding scan negative.  Discussed with RN and daughter at bedside.  Had dark green-colored bowel movement this morning.  Patient denies any GI symptoms.  Appears more energetic and more alert this morning  ROS : Afebrile, negative for chest pain   Objective: Vital signs in last 24 hours: Vitals:   09/21/20 0600 09/21/20 0745  BP: (!) 152/64   Pulse: 72   Resp: 19   Temp:  98.4 F (36.9 C)  SpO2: 100%     Physical Exam:  General:   alert, cooperative , not in acute distress HEENT : Normocephalic, atraumatic, extraocular movement intact CHEST : Midline incision from recent bypass surgery noted Lungs: No respiratory distress Heart:  Regular rate and rhythm; no murmurs, clicks, rubs,  or gallops. Abdomen: Soft, nontender, nondistended, bowel sounds present. Neuro : responds to verbal command but somewhat lethargic Psych : Mood and affect normal Rectal:  Deferred  Lab Results: Recent Labs    09/20/20 0053 09/20/20 1537 09/21/20 0315  NA 134*  --  135  K 4.0  --  4.5  CL 97*  --  99  CO2 27  --  27  GLUCOSE 85  --  74  BUN 26*  --  61*  CREATININE 4.10*  --  5.25*  CALCIUM 9.2  --  8.3*  PHOS 1.2* 2.7 2.6   Recent Labs    09/20/20 0053 09/21/20 0315  ALBUMIN 1.9* 1.7*   Recent Labs    09/20/20 0053 09/20/20 1537 09/21/20 0318  WBC 10.3  --  8.9  HGB 8.3* 7.9* 8.2*  HCT 23.8* 24.3* 25.0*  MCV 85.6  --  85.9  PLT 109*  --  84*   No results for input(s): LABPROT, INR in the last 72 hours.    Assessment/Plan: -GI bleed with dark-colored stool surrounded by streaks of fresh blood.  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  GI bleeding scan negative 08/11 -S/p CABG for  NSTEMI on 09/13/2020  -End-stage renal disease on dialysis -Altered mental status. ?  Etiology . improving.   Recommendations ------------------------ -Continue Protonix drip and Anusol suppository -Offered EGD and flexible sigmoidoscopy for this morning but patient and family wants to hold off on it as she did not had any bleeding this morning. -Okay to start full liquid diet. -Monitor H&H.  Transfuse if hemoglobin less than 8. -Okay to give aspirin because of recent cardiac events.  Recommend to hold Lovenox at least for 48 hours -Management plan discussed with RN at bedside -If evidence of ongoing bleeding or drop in hemoglobin, consider EGD and colonoscopy (history of transverse colon ulcers in the past) - GI will follow   Otis Brace MD, FACP 09/21/2020, 9:30 AM  Contact #  [phone]

## 2020-09-21 NOTE — Progress Notes (Signed)
Occupational Therapy Treatment Patient Details Name: Jodi Bell MRN: [id number] DOB: September 15, 1960 Today's Date: 09/21/2020    History of present illness Pt is a 59 y.o. female admitted 09/08/20 with acute onset SOB. Workup for CHF decompensaiton, COPD exacerbation, HTN urgency, NSTEMI; need for emergent HD. S/p LHC 8/1 which showed severe two-vessel CAD. CABGx2 8/4. GI bleeding scan negative 08/11. PMH includes ESRD (HD MWF), HTN, DM2, COPD, OSA, psoriatic RA, PAD, anemia, tobacco abuse.   OT comments  Patient supine in bed and agreeable to OT.  Once EOB with min assist, reports needs to use restroom.  Stand pivot to [facility] with min assist, toielting total assist.  RN present and aware of +BM.  Pt fatigues easily, worn out from increased BMs recently.  Reviewed sternal precautions, provided handout and requires constant cueing to adhere.  Updated plan to SNF at this time due to decreased tolerance. Will follow acutely.    Follow Up Recommendations  SNF;Supervision/Assistance - 24 hour    Equipment Recommendations  3 in 1 bedside commode    Recommendations for Other Services      Precautions / Restrictions Precautions Precautions: Fall;Sternal Precaution Booklet Issued: Yes (comment) Precaution Comments: reviewed sternal precautions pt with no carryover or awareness Restrictions Weight Bearing Restrictions: Yes Other Position/Activity Restrictions: sternal       Mobility Bed Mobility Overal bed mobility: Needs Assistance Bed Mobility: Rolling;Sidelying to Sit;Sit to Sidelying Rolling: Min assist Sidelying to sit: Min assist;HOB elevated     Sit to sidelying: Min assist;HOB elevated General bed mobility comments: cueing for sequencing and techniques, min assist for trunk support but pt able to manage LEs towards EOB; cueing for sternal precautions    Transfers Overall transfer level: Needs assistance   Transfers: Sit to/from Stand;Stand Pivot Transfers Sit to Stand: Min  assist Stand pivot transfers: Min assist;+2 safety/equipment       General transfer comment: cueing for hand placement and safety, pivoting to/from [facility] with min assist    Balance Overall balance assessment: Needs assistance Sitting-balance support: Feet supported;Single extremity supported Sitting balance-Leahy Scale: Fair Sitting balance - Comments: min guard for safety, cueing to adhere to precautions   Standing balance support: Bilateral upper extremity supported;During functional activity Standing balance-Leahy Scale: Poor Standing balance comment: relies on external support                           ADL either performed or assessed with clinical judgement   ADL Overall ADL's : Needs assistance/impaired     Grooming: Set up;Sitting                   Toilet Transfer: Minimal assistance;Stand-pivot;BSC   Toileting- Clothing Manipulation and Hygiene: Total assistance;Sit to/from stand;+2 for safety/equipment       Functional mobility during ADLs: Minimal assistance;Cueing for safety;Cueing for sequencing General ADL Comments: pt limited by weakness, sternal precautions     Vision       Perception     Praxis      Cognition Arousal/Alertness: Awake/alert Behavior During Therapy: Flat affect Overall Cognitive Status: Impaired/Different from baseline Area of Impairment: Following commands;Problem solving;Memory;Safety/judgement;Awareness                     Memory: Decreased short-term memory;Decreased recall of precautions Following Commands: Follows one step commands consistently;Follows one step commands with increased time;Follows multi-step commands inconsistently Safety/Judgement: Decreased awareness of safety;Decreased awareness of deficits Awareness: Emergent Problem Solving: Slow processing;Difficulty  sequencing;Requires verbal cues General Comments: decreased recall of precautions, cueing to adhere functionally         Exercises     Shoulder Instructions       General Comments vss    Pertinent Vitals/ Pain       Pain Assessment: Faces Faces Pain Scale: Hurts a little bit Pain Location: generalized Pain Descriptors / Indicators: Discomfort Pain Intervention(s): Monitored during session;Limited activity within patient's tolerance;Repositioned  Home Living                                          Prior Functioning/Environment              Frequency  Min 2X/week        Progress Toward Goals  OT Goals(current goals can now be found in the care plan section)  Progress towards OT goals: Progressing toward goals  Acute Rehab OT Goals Patient Stated Goal: return home with assist from family OT Goal Formulation: With patient  Plan Frequency remains appropriate;Discharge plan needs to be updated    Co-evaluation                 AM-PAC OT "6 Clicks" Daily Activity     Outcome Measure   Help from another person eating meals?: A Little Help from another person taking care of personal grooming?: A Little Help from another person toileting, which includes using toliet, bedpan, or urinal?: Total Help from another person bathing (including washing, rinsing, drying)?: A Lot Help from another person to put on and taking off regular upper body clothing?: A Lot Help from another person to put on and taking off regular lower body clothing?: Total 6 Click Score: 12    End of Session    OT Visit Diagnosis: Unsteadiness on feet (R26.81);Other abnormalities of gait and mobility (R26.89);Muscle weakness (generalized) (M62.81);Other symptoms and signs involving cognitive function;Pain   Activity Tolerance Patient tolerated treatment well   Patient Left with call bell/phone within reach;in bed;with nursing/sitter in room;with family/visitor present   Nurse Communication Mobility status        Time: [id number] OT Time Calculation (min): 27 min  Charges: OT General  Charges $OT Visit: 1 Visit OT Treatments $Self Care/Home Management : 23-37 mins  [place] Pager [phone] Office [place] 09/21/2020, 10:46 AM

## 2020-09-21 NOTE — Progress Notes (Addendum)
[facility] DAILY ICU PROGRESS NOTE                   [address]            [place] [address]          [phone]   8 Days Post-Op Procedure(s) (LRB): CORONARY ARTERY BYPASS GRAFTING (CABG), ON PUMP, TIMES TWO, USING LEFT INTERNAL MAMMARY ARTERY AND RIGHT ENDOSCOPICALLY HARVESTED GREATER SAPHENOUS VEIN (N/A) TRANSESOPHAGEAL ECHOCARDIOGRAM (TEE) (N/A)  Total Length of Stay:  LOS: 13 days   Subjective: Alert with appropriate mental status.  Family members are at the bedside.  She is currently n.p.o. Denies abdominal pain.  She has had several dark stools, some with red bloody streaks, throughout the day according to her nurse. Had dialysis today with net 1.5 L removed.   Objective: Vital signs in last 24 hours: Temp:  [98.3 F (36.8 C)-99.1 F (37.3 C)] 98.7 F (37.1 C) (08/12 1500) Pulse Rate:  [69-87] 84 (08/12 1515) Cardiac Rhythm: Heart block (08/12 0800) Resp:  [10-24] 15 (08/12 1515) BP: (106-192)/(62-123) 166/67 (08/12 1515) SpO2:  [81 %-100 %] 100 % (08/12 1515) FiO2 (%):  [30 %] 30 % (08/12 0130) Weight:  [61.2 kg-63.3 kg] 61.2 kg (08/12 1500)  Filed Weights   09/21/20 0500 09/21/20 1200 09/21/20 1500  Weight: 63.3 kg 62.9 kg 61.2 kg    Weight change: -1.8 kg       Intake/Output from previous day: 08/11 0701 - 08/12 0700 In: 1148.2 [I.V.:191.4; Blood:676.7; IV Piggyback:280.2] Out: -   Intake/Output this shift: Total I/O In: 139.9 [P.O.:120; I.V.:19.9] Out: 1500 [Other:1500]  Current Meds: Scheduled Meds:  sodium chloride   Intravenous Once   allopurinol  100 mg Oral QPM   amiodarone  200 mg Oral BID   amLODipine  10 mg Oral Daily   aspirin EC  325 mg Oral Daily   Or   aspirin  324 mg Per Tube Daily   bisacodyl  10 mg Oral Daily   Or   bisacodyl  10 mg Rectal Daily   chlorhexidine  15 mL Mouth Rinse BID   Chlorhexidine Gluconate Cloth  6 each Topical Daily   Chlorhexidine Gluconate Cloth  6 each Topical Q0600   cholecalciferol   1,000 Units Oral QPM   cinacalcet  30 mg Oral Q M,W,F-HD   darbepoetin (ARANESP) injection - DIALYSIS  100 mcg Intravenous Q Wed-HD   docusate sodium  200 mg Oral Daily   enoxaparin (LOVENOX) injection  30 mg Subcutaneous Q24H   feeding supplement  237 mL Oral TID BM   ferrous sulfate  325 mg Oral Once per day on Mon Wed Fri   fluticasone  1 spray Each Nare Daily   folic acid  1 mg Oral Daily   Gerhardt's butt cream   Topical BID   hydrocortisone  25 mg Rectal QHS   insulin aspart  0-24 Units Subcutaneous Q4H   irbesartan  300 mg Oral Daily   magnesium oxide  200 mg Oral Daily   mouth rinse  15 mL Mouth Rinse q12n4p   metoprolol tartrate  50 mg Oral BID   montelukast  10 mg Oral Q supper   multivitamin  1 tablet Oral QHS   nystatin  5 mL Oral QID   polyethylene glycol  17 g Oral Daily   polyvinyl alcohol  1 drop Both Eyes TID AC & HS   predniSONE  5 mg Oral Q breakfast   saccharomyces  boulardii  250 mg Oral Daily   sodium chloride flush  10-40 mL Intracatheter Q12H   sodium chloride flush  3 mL Intravenous Q12H   umeclidinium bromide  1 puff Inhalation Daily   Continuous Infusions:  sodium chloride     sodium chloride     pantoprazole 8 mg/hr (09/21/20 1339)   PRN Meds:.sodium chloride, sodium chloride, albuterol, alteplase, fentaNYL (SUBLIMAZE) injection, food thickener, gabapentin, heparin, heparin, heparin, labetalol, lidocaine-prilocaine, metoprolol tartrate, ondansetron (ZOFRAN) IV, oxyCODONE, pentafluoroprop-tetrafluoroeth, sodium chloride flush, sodium chloride flush  General appearance: alert, cooperative, fatigued, and no distress Heart: regular rate and rhythm Lungs: Breath sounds are clear Abdomen: soft, non tender, mild distension Extremities: no edema, redness or tenderness in the calves or thighs.  SCDs are in place since Lovenox has been discontinued. Wound: i the right lower extremity EVH incision is intact and dry.  There is some mild erythema along the  sternotomy incision.  No drainage.  Lab Results: CBC: Recent Labs    09/20/20 0053 09/20/20 1537 09/21/20 0318 09/21/20 1441  WBC 10.3  --  8.9  --   HGB 8.3*   < > 8.2* 8.5*  HCT 23.8*   < > 25.0* 24.8*  PLT 109*  --  84*  --    < > = values in this interval not displayed.    BMET:  Recent Labs    09/20/20 0053 09/21/20 0315  NA 134* 135  K 4.0 4.5  CL 97* 99  CO2 27 27  GLUCOSE 85 74  BUN 26* 61*  CREATININE 4.10* 5.25*  CALCIUM 9.2 8.3*     CMET: Lab Results  Component Value Date   WBC 8.9 09/21/2020   HGB 8.5 (L) 09/21/2020   HCT 24.8 (L) 09/21/2020   PLT 84 (L) 09/21/2020   GLUCOSE 74 09/21/2020   CHOL 193 09/10/2020   TRIG 60 09/10/2020   HDL 69 09/10/2020   LDLCALC 112 (H) 09/10/2020   ALT 9 09/13/2020   AST 16 09/13/2020   NA 135 09/21/2020   K 4.5 09/21/2020   CL 99 09/21/2020   CREATININE 5.25 (H) 09/21/2020   BUN 61 (H) 09/21/2020   CO2 27 09/21/2020   TSH 4.375 07/03/2020   INR 1.3 (H) 09/13/2020   HGBA1C 5.2 09/13/2020      PT/INR: No results for input(s): LABPROT, INR in the last 72 hours. Radiology: NM GI Blood Loss  Result Date: 09/20/2020 CLINICAL DATA:  Bloody bowel movements. EXAM: NUCLEAR MEDICINE GASTROINTESTINAL BLEEDING SCAN TECHNIQUE: Sequential abdominal images were obtained following intravenous administration of Tc-69mlabeled red blood cells. RADIOPHARMACEUTICALS:  26.3 mCi Tc-981mertechnetate in-vitro labeled red cells. COMPARISON:  CT September 09, 2020 FINDINGS: Study is degraded by patient motion. Normal radiotracer uptake visualized within the abdominopelvic vessels and hepatic parenchyma. Mild radiotracer activity seen within the gastric parenchymal, appearing similar throughout phases of imaging, likely represents free pertechnetate. No scintigraphic findings abnormal radiotracer uptake which increases or moving through bowel. IMPRESSION: No definite scintigraphic evidence of active gastrointestinal bleed. Electronically  Signed   By: JeDahlia BailiffD   On: 09/20/2020 21:01   DG CHEST PORT 1 VIEW  Result Date: 09/21/2020 CLINICAL DATA:  Central line placement EXAM: PORTABLE CHEST 1 VIEW COMPARISON:  09/20/2020 at 1706 hours FINDINGS: Left IJ venous catheter terminates in the mid SVC. Tiny right apical pneumothorax, unchanged. Small left pleural effusion. Increased interstitial markings, suggesting very mild interstitial edema. Lingular atelectasis. Cardiomegaly.  Postsurgical changes related to prior CABG. Median sternotomy.  IMPRESSION: Cardiomegaly with mild interstitial edema and small left pleural effusion. Tiny right apical pneumothorax, unchanged. Left IJ venous catheter terminates in the mid SVC. Electronically Signed   By: Julian Hy M.D.   On: 09/21/2020 00:00   DG CHEST PORT 1 VIEW  Result Date: 09/20/2020 CLINICAL DATA:  Central line placement EXAM: PORTABLE CHEST 1 VIEW COMPARISON:  09/18/2020 FINDINGS: Left central line is in place with the tip in the upper SVC. Both previously seen bilateral pneumothoraces not as well visualized on today's study. Suspect continued right apical pneumothorax. No definite left pneumothorax. Small left pleural effusion. Left lower lobe atelectasis. Changes of CABG. IMPRESSION: Left central line placement with the tip in the upper SVC. Suspect small residual right apical pneumothorax. No definite left pneumothorax. Left base atelectasis with small left effusion. Electronically Signed   By: Rolm Baptise M.D.   On: 09/20/2020 17:06     Assessment/Plan: S/P Procedure(s) (LRB): CORONARY ARTERY BYPASS GRAFTING (CABG), ON PUMP, TIMES TWO, USING LEFT INTERNAL MAMMARY ARTERY AND RIGHT ENDOSCOPICALLY HARVESTED GREATER SAPHENOUS VEIN (N/A) TRANSESOPHAGEAL ECHOCARDIOGRAM (TEE) (N/A)   -Postop day 8 CABG x2 for multivessel coronary disease after presenting with non-ST elevation myocardial infarction.  Ejection fraction 60% on preop echo.  Stable hemodynamics and cardiac rhythm.   She has been out of bed to the chair and is walking to the bathroom with assistance.  -Postop A. Fib-maintaining sinus rhythm on oral amiodarone 200 mg p.o. twice daily  -Hypertension-blood pressure in the 160s on amlodipine, irbesartan, and metoprolol.  She also received as needed labetalol  -End-stage renal disease-receiving hemodialysis M-W-F  -GI bleed-  appreciate evaluation by gastroenterology.  Ms. Jodi Bell has a history of esophagitis, esophageal ulcer, and duodenal / colonic AVM's.  H&H are stable compared with yesterday but hematocrit only improved by 0.5% with 2 units packed red blood cell transfusion yesterday.  She is on a Protonix drip per GI recommendations.  Nuclear medicine GI bleeding scan done yesterday was negative.  Dr. Alessandra Bevels has offered EGD and sigmoidoscopy for today but family declined.  I encouraged them to reconsider endoscopy for evaluation of suspected ongoing GI losses.  -VT prophylaxis-enoxaparin has been discontinued due to GI bleed.  Continue with SCDs.  Macarthur Critchley, PA-C [phone]  Agree with above Endoscopy tomorrow  Lajuana Matte

## 2020-09-21 NOTE — Progress Notes (Signed)
Patient ID: Jodi Bell, female   DOB: 12-17-1960, 60 y.o.   MRN: [id number]  [facility] Progress Note   Assessment/ Plan:   1.  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    2. ESRD: On hemodialysis on a Monday/Wednesday/Friday schedule as an outpatient  - Continue HD per her MWF schedule   3. Anemia CKD: continue ESA  - She got aranesp 100 mcg on 8/10 after the dose was missed the preceding week.  She had been getting outpatient and tolerating   4. HTN -  on 8/10 changed to home med labetalol (ok with CTS). (reported home dose is 400 mg TID).  Given issues with tachycardia changing back to metoprolol.  See irbesartan 150 mg daily started on 8/9 (home med olmesartan 40 mg daily - not on formulary here and per pharmacy equivalent would be irbesartan 300 mg daily).  She is not actually on clonidine at home per her report.  Charted as below her EDW if last weight of 60.7 is accurate - her weight may have changed during her hospitalization.  With her nausea and tachycardia she may not tolerate this weight.   5. Tachycardia/? afib - amio per primary team.  Changed back to metoprolol from labetalol and increased dose to 50 mg BID. If recurs would contact cardiology  6. GI bleed - GI consulted.  She is s/p PRBC's. 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  Repleted phos On sensipar with HD  8. Acute hypoxic resp failure - on supplemental oxygen. Optimize volume with HD  9.  Acute neurological changes: possible seizure and  MRI deferred per neurology.  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  Per charting improved after HD  Dispo - inpatient monitoring; she is in ICU  Subjective:   Last HD on 8/10 with 1.4 kg UF.  Tx terminated after 2 hours due to tachycardia.  Had GI bleed yesterday.  Spoke with RN - last bloody BM last night.  She is not planned for  procedures today other than HD  Review of systems:   Denies shortness of breath or chest pain Denies nausea today  Bloody BM's yesterday   Objective:   BP (!) 176/74 (BP Location: Right Arm)   Pulse 74   Temp 98.7 F (37.1 C) (Oral)   Resp 16   Ht '5\' 2"'$  (1.575 m)   Wt 60.7 kg   SpO2 (S) (!) 81% Comment: patient refusing BiPAP and educated  BMI 24.48 kg/m   Physical Exam:    General adult female in bed in no acute distress HEENT normocephalic atraumatic sclera anicteric Neck supple trachea midline Lungs clear to auscultation bilaterally normal work of breathing at rest on room air Heart S1S2 no rub Abdomen soft nontender nondistended Extremities no edema  Psych normal mood and affect Neuro - alert and oriented x 3 provides hx and follows commands Access LUE AVF bruit and thrill   Labs: BMET Recent Labs  Lab 09/15/20 0440 09/16/20 0405 09/17/20 0655 09/17/20 1014 09/18/20 0312 09/19/20 0641 09/20/20 0053 09/20/20 1537 09/21/20 0315  NA 130* 133* 131* 134* 133* 134* 134*  --  135  K 5.0 4.2 4.1 2.9* 3.4* 3.8 4.0  --  4.5  CL 96* 97* 97*  --  96* 98 97*  --  99  CO2 '24 27 23  '$ --  '29 26 27  '$ --  27  GLUCOSE 88 91 106*  --  106* 100* 85  --  74  BUN 25* 21* 31*  --  19 27* 26*  --  61*  CREATININE 5.26* 4.28* 5.41*  --  4.01* 5.34* 4.10*  --  5.25*  CALCIUM 9.7 9.3 9.2  --  9.2 9.8 9.2  --  8.3*  PHOS  --   --  3.2  3.3  --   --  1.9* 1.2* 2.7 2.6   CBC Recent Labs  Lab 09/18/20 0312 09/19/20 0444 09/20/20 0053 09/20/20 1537 09/21/20 0318  WBC 7.0 9.7 10.3  --  8.9  HGB 9.0* 9.3* 8.3* 7.9* 8.2*  HCT 26.5* 27.4* 23.8* 24.3* 25.0*  MCV 88.0 87.0 85.6  --  85.9  PLT 96* 114* 109*  --  84*      Medications:     sodium chloride   Intravenous Once   allopurinol  100 mg Oral QPM   amiodarone  200 mg Oral BID   amLODipine  10 mg Oral Daily   aspirin EC  325 mg Oral Daily   Or   aspirin  324 mg Per Tube Daily   bisacodyl  10 mg Oral Daily   Or    bisacodyl  10 mg Rectal Daily   chlorhexidine  15 mL Mouth Rinse BID   Chlorhexidine Gluconate Cloth  6 each Topical Daily   Chlorhexidine Gluconate Cloth  6 each Topical Q0600   cholecalciferol  1,000 Units Oral QPM   cinacalcet  30 mg Oral Q M,W,F-HD   darbepoetin (ARANESP) injection - DIALYSIS  100 mcg Intravenous Q Wed-HD   docusate sodium  200 mg Oral Daily   enoxaparin (LOVENOX) injection  30 mg Subcutaneous Q24H   feeding supplement  237 mL Oral TID BM   ferrous sulfate  325 mg Oral Once per day on Mon Wed Fri   fluticasone  1 spray Each Nare Daily   folic acid  1 mg Oral Daily   Gerhardt's butt cream   Topical BID   hydrocortisone  25 mg Rectal QHS   insulin aspart  0-24 Units Subcutaneous Q4H   irbesartan  300 mg Oral Daily   magnesium oxide  200 mg Oral Daily   mouth rinse  15 mL Mouth Rinse q12n4p   metoprolol tartrate  25 mg Oral BID   montelukast  10 mg Oral Q supper   multivitamin  1 tablet Oral QHS   nystatin  5 mL Oral QID   polyethylene glycol  17 g Oral Daily   polyvinyl alcohol  1 drop Both Eyes TID AC & HS   predniSONE  5 mg Oral Q breakfast   saccharomyces boulardii  250 mg Oral Daily   sodium chloride flush  10-40 mL Intracatheter Q12H   sodium chloride flush  3 mL Intravenous Q12H   umeclidinium bromide  1 puff Inhalation Daily   Outpatient HD orders  OP HD: MWF [facility] ([facility])  3.5h   3.0/2.5 bath   Hep 3500 bolus and 500 main LUA AVF   400/600 EDW 64 kg (and she was getting to this) ESA: aranesp 90 mcg weekly Other meds: not on calcitriol Allergic to Ferrlecit (no reaction is provided by unit)  Claudia Desanctis, MD 09/21/2020, 6:35 AM

## 2020-09-21 NOTE — TOC Initial Note (Signed)
Transition of Care [facility]) - Initial/Assessment Note    Patient Details  Name: Jodi Bell MRN: [id number] Date of Birth: Nov 05, 1960  Transition of Care [facility]) CM/SW Contact:    Trula Ore, [place] Phone Number: 09/21/2020, 3:50 PM  Clinical Narrative:                  CSW received consult for possible SNF placement at time of discharge. CSW spoke with patient at bedside regarding PT recommendation of SNF placement at time of discharge. Patient reports she comes from home with son and daughter-n-law.Patient expressed understanding of PT recommendation and is agreeable to SNF placement at time of discharge. Patient gave CSW permission to fax out initial referral near the [facility] and [place] area. Patient is HD. Patients HD days are Monday, Wednesday, and Friday. Patient has received the COVID vaccines as well as booster. Last booster was 5/22. Patient has Cpap from home.Patient gave CSW permission to discuss dc plan with her daughter Bartolo Darter. CSW updated daughter Bartolo Darter.No further questions reported at this time. CSW to continue to follow and assist with discharge planning needs.   Expected Discharge Plan: Skilled Nursing Facility Barriers to Discharge: Continued Medical Work up   Patient Goals and CMS Choice Patient states their goals for this hospitalization and ongoing recovery are:: to go to SNF CMS Medicare.gov Compare Post Acute Care list provided to:: Patient Choice offered to / list presented to : Patient  Expected Discharge Plan and Services Expected Discharge Plan: [place] In-house Referral: Clinical Social Work     Living arrangements for the past 2 months: [place]                                      Prior Living Arrangements/Services Living arrangements for the past 2 months: Single Family Home Lives with:: Self, Adult Children (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) Patient language and need for interpreter  reviewed:: Yes Do you feel safe going back to the place where you live?: No   SNF  Need for Family Participation in Patient Care: Yes (Comment) Care giver support system in place?: Yes (comment)   Criminal Activity/Legal Involvement Pertinent to Current Situation/Hospitalization: No - Comment as needed  Activities of Daily Living Home Assistive Devices/Equipment: Cane (specify quad or straight) ADL Screening (condition at time of admission) Patient's cognitive ability adequate to safely complete daily activities?: Yes Is the patient deaf or have difficulty hearing?: No Does the patient have difficulty seeing, even when wearing glasses/contacts?: No Does the patient have difficulty concentrating, remembering, or making decisions?: No Patient able to express need for assistance with ADLs?: Yes Does the patient have difficulty dressing or bathing?: No Independently performs ADLs?: Yes (appropriate for developmental age) Does the patient have difficulty walking or climbing stairs?: Yes Weakness of Legs: Both Weakness of Arms/Hands: None  Permission Sought/Granted Permission sought to share information with : Case Manager, Family Supports, Chartered certified accountant granted to share information with : Yes, Verbal Permission Granted  Share Information with NAME: Dameika  Permission granted to share info w AGENCY: SNF  Permission granted to share info w Relationship: daughter  Permission granted to share info w Contact Information: [place]  Emotional Assessment Appearance:: Appears stated age Attitude/Demeanor/Rapport: Gracious Affect (typically observed): Calm Orientation: : Oriented to Self, Oriented to Place, Oriented to  Time, Oriented to Situation Alcohol / Substance Use:  Not Applicable Psych Involvement: No (comment)  Admission diagnosis:  CHF (congestive heart failure) (HCC) [I50.9] Acute pulmonary edema (HCC) [J81.0] ESRD (end stage renal disease) ([place])  [N18.6] Hypertensive urgency [I16.0] S/P CABG x 2 [Z95.1] Patient Active Problem List   Diagnosis Date Noted   Pressure injury of skin 09/21/2020   Malnutrition of moderate degree 09/20/2020   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/13/2020   Non-ST elevation (NSTEMI) myocardial infarction [facility])    Pulmonary edema 09/08/2020   CHF (congestive heart failure) ([place]) 09/08/2020   Hypertensive urgency    Dyspnea 07/03/2020   GI bleeding 05/17/2019   Volume overload state of heart 09/14/2018   Intervertebral cervical disc disorder with myelopathy, cervical region 09/14/2018   OSA (obstructive sleep apnea) 09/14/2018   Asthma 05/16/2018   Suspected COVID-19 virus infection 05/16/2018   ESRD (end stage renal disease) ([place]) 05/16/2018   Fluid overload 02/27/2018   Chest pain, rule out acute myocardial infarction 12/12/2017   Status post left partial knee replacement 11/12/2017   Hematochezia 09/20/2017   GERD (gastroesophageal reflux disease) 09/20/2017   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 2 diabetes mellitus ([place]) 09/20/2017   COPD (chronic obstructive pulmonary disease) ([place]) 09/20/2017   Tobacco use 09/20/2017   Symptomatic anemia 09/20/2017   Headache 09/20/2017   Serotonin syndrome 09/28/2013   Acute respiratory failure with hypoxia ([place]) 09/28/2013   Encephalopathy acute 09/28/2013   Hypotension, unspecified 09/28/2013   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 [facility] [place]Phone: [phone]: [phone] [facility] [address]Phone: [phone]: 3[place][id number][place] [address] [place]Phone: [phone]: [phone]    Social Determinants of Health (SDOH) Interventions    Readmission Risk Interventions No flowsheet data found.

## 2020-09-22 ENCOUNTER — Encounter (HOSPITAL_COMMUNITY)
Admission: EM | Disposition: A | Discharge status: Home Health | Payer: Self-pay | Source: Home / Self Care | Attending: Surgery

## 2020-09-22 ENCOUNTER — Inpatient Hospital Stay (HOSPITAL_COMMUNITY): Payer: Medicare PPO | Admitting: Anesthesiology

## 2020-09-22 DIAGNOSIS — K279 Peptic ulcer, site unspecified, unspecified as acute or chronic, without hemorrhage or perforation: Secondary | ICD-10-CM | POA: Diagnosis not present

## 2020-09-22 DIAGNOSIS — K922 Gastrointestinal hemorrhage, unspecified: Secondary | ICD-10-CM | POA: Diagnosis not present

## 2020-09-22 DIAGNOSIS — D62 Acute posthemorrhagic anemia: Secondary | ICD-10-CM

## 2020-09-22 DIAGNOSIS — I214 Non-ST elevation (NSTEMI) myocardial infarction: Secondary | ICD-10-CM | POA: Diagnosis not present

## 2020-09-22 HISTORY — PX: ESOPHAGOGASTRODUODENOSCOPY: SHX5428

## 2020-09-22 HISTORY — PX: HEMOSTASIS CLIP PLACEMENT: SHX6857

## 2020-09-22 HISTORY — PX: HEMOSTASIS CONTROL: SHX6838

## 2020-09-22 LAB — GLUCOSE, CAPILLARY
Glucose-Capillary: 105 mg/dL — ABNORMAL HIGH (ref 70–99)
Glucose-Capillary: 167 mg/dL — ABNORMAL HIGH (ref 70–99)
Glucose-Capillary: 70 mg/dL (ref 70–99)
Glucose-Capillary: 74 mg/dL (ref 70–99)
Glucose-Capillary: 77 mg/dL (ref 70–99)
Glucose-Capillary: 81 mg/dL (ref 70–99)
Glucose-Capillary: 93 mg/dL (ref 70–99)

## 2020-09-22 LAB — CBC WITH DIFFERENTIAL/PLATELET
Abs Immature Granulocytes: 0.08 10*3/uL — ABNORMAL HIGH (ref 0.00–0.07)
Basophils Absolute: 0 10*3/uL (ref 0.0–0.1)
Basophils Relative: 0 %
Eosinophils Absolute: 0.3 10*3/uL (ref 0.0–0.5)
Eosinophils Relative: 4 %
HCT: 22.8 % — ABNORMAL LOW (ref 36.0–46.0)
Hemoglobin: 7.5 g/dL — ABNORMAL LOW (ref 12.0–15.0)
Immature Granulocytes: 1 %
Lymphocytes Relative: 17 %
Lymphs Abs: 1.4 10*3/uL (ref 0.7–4.0)
MCH: 28.5 pg (ref 26.0–34.0)
MCHC: 32.9 g/dL (ref 30.0–36.0)
MCV: 86.7 fL (ref 80.0–100.0)
Monocytes Absolute: 0.7 10*3/uL (ref 0.1–1.0)
Monocytes Relative: 8 %
Neutro Abs: 5.9 10*3/uL (ref 1.7–7.7)
Neutrophils Relative %: 70 %
Platelets: 78 10*3/uL — ABNORMAL LOW (ref 150–400)
RBC: 2.63 MIL/uL — ABNORMAL LOW (ref 3.87–5.11)
RDW: 23.4 % — ABNORMAL HIGH (ref 11.5–15.5)
WBC: 8.4 10*3/uL (ref 4.0–10.5)
nRBC: 0 % (ref 0.0–0.2)

## 2020-09-22 LAB — RENAL FUNCTION PANEL
Albumin: 1.8 g/dL — ABNORMAL LOW (ref 3.5–5.0)
Anion gap: 6 (ref 5–15)
BUN: 37 mg/dL — ABNORMAL HIGH (ref 6–20)
CO2: 29 mmol/L (ref 22–32)
Calcium: 8.4 mg/dL — ABNORMAL LOW (ref 8.9–10.3)
Chloride: 101 mmol/L (ref 98–111)
Creatinine, Ser: 3.97 mg/dL — ABNORMAL HIGH (ref 0.44–1.00)
GFR, Estimated: 12 mL/min — ABNORMAL LOW (ref >60)
Glucose, Bld: 84 mg/dL (ref 70–99)
Phosphorus: 2 mg/dL — ABNORMAL LOW (ref 2.5–4.6)
Potassium: 3.8 mmol/L (ref 3.5–5.1)
Sodium: 136 mmol/L (ref 135–145)

## 2020-09-22 LAB — CBC
HCT: 21.2 % — ABNORMAL LOW (ref 36.0–46.0)
Hemoglobin: 7 g/dL — ABNORMAL LOW (ref 12.0–15.0)
MCH: 28.6 pg (ref 26.0–34.0)
MCHC: 33 g/dL (ref 30.0–36.0)
MCV: 86.5 fL (ref 80.0–100.0)
Platelets: 72 10*3/uL — ABNORMAL LOW (ref 150–400)
RBC: 2.45 MIL/uL — ABNORMAL LOW (ref 3.87–5.11)
RDW: 23 % — ABNORMAL HIGH (ref 11.5–15.5)
WBC: 7.8 10*3/uL (ref 4.0–10.5)
nRBC: 0 % (ref 0.0–0.2)

## 2020-09-22 LAB — HEMOGLOBIN AND HEMATOCRIT, BLOOD
HCT: 27.2 % — ABNORMAL LOW (ref 36.0–46.0)
HCT: 26.4 % — ABNORMAL LOW (ref 36.0–46.0)
Hemoglobin: 9 g/dL — ABNORMAL LOW (ref 12.0–15.0)
Hemoglobin: 8.7 g/dL — ABNORMAL LOW (ref 12.0–15.0)

## 2020-09-22 LAB — PREPARE RBC (CROSSMATCH)

## 2020-09-22 SURGERY — EGD (ESOPHAGOGASTRODUODENOSCOPY)
Anesthesia: Monitor Anesthesia Care

## 2020-09-22 MED ORDER — SODIUM CHLORIDE 0.9 % IV SOLN: INTRAVENOUS | Status: DC

## 2020-09-22 MED ORDER — SODIUM CHLORIDE (PF) 0.9 % IJ SOLN
PREFILLED_SYRINGE | INTRAMUSCULAR | Status: DC | PRN
  Administered 2020-09-22: 4 mL

## 2020-09-22 MED ORDER — PROPOFOL 500 MG/50ML IV EMUL
INTRAVENOUS | Status: DC | PRN
  Administered 2020-09-22: 125 ug/kg/min via INTRAVENOUS

## 2020-09-22 MED ORDER — SODIUM CHLORIDE 0.9% IV SOLUTION: Freq: Once | INTRAVENOUS | Status: AC

## 2020-09-22 MED ORDER — HYDRALAZINE HCL 50 MG PO TABS
50.0000 mg | ORAL_TABLET | Freq: Three times a day (TID) | ORAL | Status: DC
Start: 2020-09-22 — End: 2020-09-23
  Administered 2020-09-22 – 2020-09-23 (×3): 50 mg via ORAL
  Filled 2020-09-22 (×3): qty 1

## 2020-09-22 MED ORDER — ISOSORBIDE MONONITRATE ER 30 MG PO TB24
30.0000 mg | ORAL_TABLET | Freq: Every day | ORAL | Status: DC
  Administered 2020-09-22: 30 mg via ORAL
  Filled 2020-09-22: qty 1

## 2020-09-22 MED ORDER — LIDOCAINE 2% (20 MG/ML) 5 ML SYRINGE
INTRAMUSCULAR | Status: DC | PRN
  Administered 2020-09-22: 40 mg via INTRAVENOUS

## 2020-09-22 MED ORDER — PROPOFOL 10 MG/ML IV BOLUS
INTRAVENOUS | Status: DC | PRN
  Administered 2020-09-22: 20 mg via INTRAVENOUS

## 2020-09-22 MED ORDER — EPINEPHRINE 1 MG/10ML IJ SOSY
PREFILLED_SYRINGE | INTRAMUSCULAR | Status: AC
  Filled 2020-09-22: qty 10

## 2020-09-22 MED ORDER — SODIUM PHOSPHATES 45 MMOLE/15ML IV SOLN
10.0000 mmol | Freq: Once | INTRAVENOUS | Status: AC
  Administered 2020-09-22: 10 mmol via INTRAVENOUS
  Filled 2020-09-22: qty 3.33

## 2020-09-22 MED ORDER — LACTATED RINGERS IV SOLN: INTRAVENOUS | Status: DC | PRN

## 2020-09-22 NOTE — Op Note (Signed)
[facility] Patient Name: Jodi Bell Procedure Date : 09/22/2020 MRN: [id number] Attending MD: Arta Silence , MD Date of Birth: Feb 08, 1961 CSN: [id number] Age: 60 Admit Type: Inpatient Procedure:                Upper GI endoscopy Indications:              Acute post hemorrhagic anemia, Hematochezia, Melena Providers:                Arta Silence, MD, Kary Kos RN, RN, Laverda Sorenson, Technician, Clearnce Sorrel, CRNA Referring MD:             Dr. Cyndia Bent (cardiothoracic surgery) Medicines:                Monitored Anesthesia Care Complications:            No immediate complications. Estimated Blood Loss:     Estimated blood loss: none. Procedure:                Pre-Anesthesia Assessment:                           - Prior to the procedure, a History and Physical                            was performed, and patient medications and                            allergies were reviewed. The patient's tolerance of                            previous anesthesia was also reviewed. The risks                            and benefits of the procedure and the sedation                            options and risks were discussed with the patient.                            All questions were answered, and informed consent                            was obtained. Prior Anticoagulants: The patient has                            taken Lovenox (enoxaparin), last dose was 1 day                            prior to procedure. ASA Grade Assessment: III - A                            patient with severe systemic disease. After  reviewing the risks and benefits, the patient was                            deemed in satisfactory condition to undergo the                            procedure.                           After obtaining informed consent, the endoscope was                            passed under direct vision. Throughout the                             procedure, the patient's blood pressure, pulse, and                            oxygen saturations were monitored continuously. The                            [id number] Olympus endoscope was introduced                            through the mouth, and advanced to the second part                            of duodenum. The upper GI endoscopy was                            accomplished without difficulty. The patient                            tolerated the procedure well. Findings:      The examined esophagus was normal.      Upon entry into the stomach, there was fresh and old clotted blood.       After extensive lavage, there were two dominant areas with adherent clot       with some fresh oozing, most consistent with dieulafoy lesions; in the       very proximal stomach, about 1cm distal to the GE junction, there was       small ulcer with visible vessel. One non-bleeding gastric ulcer with a       visible vessel was found in the cardia. The lesion was 4 mm in largest       dimension. To prevent bleeding post-intervention, one hemostatic clip       was successfully placed. There was no bleeding at the end of the       procedure.      A Dieulafoy lesion with no bleeding and stigmata of recent bleeding was       found in the cardia. Area was successfully injected with 4 mL [place] ink       for hemostasis. To prevent bleeding post-intervention, one hemostatic       clip was successfully placed. There was no bleeding at the end of the  procedure.      A Dieulafoy lesion with no bleeding and was found in the gastric fundus.       To prevent bleeding post-intervention, one hemostatic clip was       successfully placed. There was no bleeding at the end of the procedure.      The exam of the stomach was otherwise normal.      The duodenal bulb, first portion of the duodenum and second portion of       the duodenum were normal. Impression:               - Normal  esophagus.                           - Non-bleeding gastric ulcer with a visible vessel.                            Clip was placed.                           - Dieulafoy lesion of stomach x 2, epi applied to                            one, and clips applied to both.                           - Normal duodenal bulb, first portion of the                            duodenum and second portion of the duodenum. Moderate Sedation:      None Recommendation:           - Return patient to hospital ward for ongoing care.                           - Clear liquid diet today.                           - Minimize anticoagulants as clinically feasible.                           - IV BID PPI at the least until further notice.                           - Continue present medications.                           [facility] will follow. Procedure Code(s):        --- Professional ---                           [phone], Esophagogastroduodenoscopy, flexible,                            transoral; with control of bleeding, any method Diagnosis Code(s):        --- Professional ---  K25.4, Chronic or unspecified gastric ulcer with                            hemorrhage                           K31.82, Dieulafoy lesion (hemorrhagic) of stomach                            and duodenum                           D62, Acute posthemorrhagic anemia                           K92.1, Melena (includes Hematochezia) CPT copyright 2019 American Medical Association. All rights reserved. The codes documented in this report are preliminary and upon coder review may  be revised to meet current compliance requirements. Arta Silence, MD 09/22/2020 11:20:40 AM This report has been signed electronically. Number of Addenda: 0

## 2020-09-22 NOTE — Progress Notes (Signed)
      [address]       [place] [address]             [phone]                 Day of Surgery Procedure(s) (LRB): ESOPHAGOGASTRODUODENOSCOPY (EGD) (N/A)   Events: Continues to have some bloody stools Going for endoscopy _______________________________________________________________ Vitals: BP (!) 171/75   Pulse 70   Temp 98.4 F (36.9 C) (Oral)   Resp 12   Ht '5\' 2"'$  (1.575 m)   Wt 62.5 kg   SpO2 100%   BMI 25.20 kg/m   - Neuro: Alert NAD  - Cardiovascular: Sinus  Drips: None.      - Pulm: Easy work of breathing    ABG    Component Value Date/Time   PHART 7.439 09/17/2020 1014   PCO2ART 43.7 09/17/2020 1014   PO2ART 60 (L) 09/17/2020 1014   HCO3 29.6 (H) 09/17/2020 1014   TCO2 31 09/17/2020 1014   ACIDBASEDEF 1.0 09/13/2020 2152   O2SAT 91.0 09/17/2020 1014    - Abd: Soft - Extremity: Warm  .Intake/Output      08/12 0701 08/13 0700 08/13 0701 08/14 0700   P.O. 600    I.V. (mL/kg) 239.9 (3.8) 10 (0.2)   Blood  337.5   IV Piggyback     Total Intake(mL/kg) 839.9 (13.4) 347.5 (5.6)   Other 1500    Stool 400    Total Output 1900    Net -1060.2 +347.5        Stool Occurrence 6 x       _______________________________________________________________ Labs: CBC Latest Ref Rng & Units 09/22/2020 09/22/2020 09/21/2020  WBC 4.0 - 10.5 K/uL 8.4 7.8 -  Hemoglobin 12.0 - 15.0 g/dL 7.5(L) 7.0(L) 8.5(L)  Hematocrit 36.0 - 46.0 % 22.8(L) 21.2(L) 24.8(L)  Platelets 150 - 400 K/uL 78(L) 72(L) -   CMP Latest Ref Rng & Units 09/22/2020 09/21/2020 09/20/2020  Glucose 70 - 99 mg/dL 84 74 85  BUN 6 - 20 mg/dL 37(H) 61(H) 26(H)  Creatinine 0.44 - 1.00 mg/dL 3.97(H) 5.25(H) 4.10(H)  Sodium 135 - 145 mmol/L 136 135 134(L)  Potassium 3.5 - 5.1 mmol/L 3.8 4.5 4.0  Chloride 98 - 111 mmol/L 101 99 97(L)  CO2 22 - 32 mmol/L '29 27 27  '$ Calcium 8.9 - 10.3 mg/dL 8.4(L) 8.3(L) 9.2  Total Protein 6.5 - 8.1 g/dL - - -  Total Bilirubin 0.3 - 1.2 mg/dL - -  -  Alkaline Phos 38 - 126 U/L - - -  AST 15 - 41 U/L - - -  ALT 0 - 44 U/L - - -    CXR: -  _______________________________________________________________  Assessment and Plan: POD 9 s/p CABG.  Questionable seizure activity last week.  Now resolved.  End-stage renal disease on hemodialysis.  GI bleed status post endoscopy with clipping of multiple ulcers.  Neuro: Pain controlled CV: Stable.  Remains on aspirin.  Holding Lovenox.  On statin and beta-blocker. Pulm: Continue pulmonary hygiene Renal: On hemodialysis. GI: Multiple ulcers clipped.  Gastric origin.  We will continue to follow serial H&H's.  On Protonix drip. Heme: Checking serial hemoglobins.  Will transfuse if hemoglobin is less than 7. ID: Afebrile Endo: Sliding scale insulin Dispo: Continue ICU care.   Lajuana Matte 09/22/2020 10:22 AM

## 2020-09-22 NOTE — Anesthesia Postprocedure Evaluation (Signed)
Anesthesia Post Note  Patient: Jodi Bell  Procedure(s) Performed: ESOPHAGOGASTRODUODENOSCOPY (EGD) HEMOSTASIS CONTROL HEMOSTASIS CLIP PLACEMENT     Patient location during evaluation: PACU Anesthesia Type: MAC Level of consciousness: awake and alert Pain management: pain level controlled Vital Signs Assessment: post-procedure vital signs reviewed and stable Respiratory status: spontaneous breathing, nonlabored ventilation and respiratory function stable Cardiovascular status: stable and blood pressure returned to baseline Anesthetic complications: no   No notable events documented.  Last Vitals:  Vitals:   09/22/20 1116 09/22/20 1144  BP: (!) 187/65   Pulse: 69   Resp: 13   Temp:  36.9 C  SpO2: 100%     Last Pain:  Vitals:   09/22/20 1144  TempSrc: Oral  PainSc:                  [name]

## 2020-09-22 NOTE — Transfer of Care (Signed)
Immediate Anesthesia Transfer of Care Note  Patient: Jodi Bell  Procedure(s) Performed: ESOPHAGOGASTRODUODENOSCOPY (EGD) HEMOSTASIS CONTROL HEMOSTASIS CLIP PLACEMENT  Patient Location: Endoscopy Unit  Anesthesia Type:MAC  Level of Consciousness: drowsy  Airway & Oxygen Therapy: Patient Spontanous Breathing  Post-op Assessment: Report given to RN and Post -op Vital signs reviewed and stable  Post vital signs: Reviewed and stable  Last Vitals:  Vitals Value Taken Time  BP 137/61 09/22/20 1056  Temp    Pulse 65 09/22/20 1056  Resp 20 09/22/20 1056  SpO2 97 % 09/22/20 1056  Vitals shown include unvalidated device data.  Last Pain:  Vitals:   09/22/20 0953  TempSrc: Oral  PainSc: 0-No pain      Patients Stated Pain Goal: 0 (56/43/32 9518)  Complications: No notable events documented.

## 2020-09-22 NOTE — Interval H&P Note (Signed)
History and Physical Interval Note:  09/22/2020 10:12 AM  Jodi Bell  has presented today for surgery, with the diagnosis of gi bleed.  The various methods of treatment have been discussed with the patient and family. After consideration of risks, benefits and other options for treatment, the patient has consented to  Procedure(s): ESOPHAGOGASTRODUODENOSCOPY (EGD) (N/A) as a surgical intervention.  The patient's history has been reviewed, patient examined, no change in status, stable for surgery.  I have reviewed the patient's chart and labs.  Questions were answered to the patient's satisfaction.     Landry Dyke

## 2020-09-22 NOTE — Progress Notes (Signed)
Patient ID: Jodi Bell, female   DOB: Jul 18, 1960, 60 y.o.   MRN: [id number]  [facility] Progress Note   Assessment/ Plan:   1.  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    2. ESRD: On hemodialysis on a Monday/Wednesday/Friday schedule as an outpatient  - Continue HD per her MWF schedule   3. Anemia CKD as well as acute blood loss: continue ESA  - She got aranesp 100 mcg on 8/10 after the dose was missed the preceding week.  She had been getting outpatient and tolerating.  Giving 1 unit PRBC's on 8/13  4. HTN -  on 8/10 changed to home med labetalol (ok with CTS). (reported home dose is 400 mg TID).  Given issues with tachycardia changed back to metoprolol.  On irbesartan 300 mg daily (home med olmesartan 40 mg daily - not on formulary here and per pharmacy equivalent).  She is not actually on clonidine at home per her report.  Added back imdur at reduced dose and follow.  Charted as below her EDW if last weight of 60.7 is accurate - her weight may have changed during her hospitalization.  With her nausea and tachycardia she may not tolerate this weight.   5. Tachycardia/? afib - amio per primary team.  Changed back to metoprolol from labetalol and increased dose to metoprolol tartrate 50 mg BID. If recurs would consult cardiology  6. GI bleed - GI consulted and pt is for EGD today.  She is s/p PRBC's. 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  Repleted phos earlier.  On sensipar with HD  8. Acute hypoxic resp failure - on supplemental oxygen. Optimize volume with HD  9.  Acute neurological changes: possible seizure and  MRI deferred per neurology.  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  Per charting improved after HD  Dispo - inpatient monitoring; she is in ICU  Subjective:   Last HD on 8/12 with 1.5 kg UF.  She is ok with getting blood and has  rec'd before.  She is to have an EGD today.   Review of systems:   Denies shortness of breath or chest pain Denies nausea today  dark BM overnight per RN and pt   Objective:   BP (!) 183/75   Pulse (!) 119   Temp 98.5 F (36.9 C) (Oral)   Resp (!) 26   Ht '5\' 2"'$  (1.575 m)   Wt 61.2 kg   SpO2 100%   BMI 24.68 kg/m   Physical Exam:    General adult female in bed in no acute distress HEENT normocephalic atraumatic sclera anicteric Neck supple trachea midline Lungs clear to auscultation bilaterally normal work of breathing at rest on 3 liters Heart S1S2 no rub Abdomen soft nontender nondistended Extremities no edema  Psych normal mood and affect Neuro - alert and oriented x 3 provides hx and follows commands Access LUE AVF bruit and thrill   Labs: BMET Recent Labs  Lab 09/16/20 0405 09/17/20 0655 09/17/20 1014 09/18/20 0312 09/19/20 0641 09/20/20 0053 09/20/20 1537 09/21/20 0315  NA 133* 131* 134* 133* 134* 134*  --  135  K 4.2 4.1 2.9* 3.4* 3.8 4.0  --  4.5  CL 97* 97*  --  96* 98 97*  --  99  CO2 27 23  --  '29 26 27  '$ --  27  GLUCOSE 91 106*  --  106* 100* 85  --  74  BUN 21* 31*  --  19 27* 26*  --  61*  CREATININE 4.28* 5.41*  --  4.01* 5.34* 4.10*  --  5.25*  CALCIUM 9.3 9.2  --  9.2 9.8 9.2  --  8.3*  PHOS  --  3.2  3.3  --   --  1.9* 1.2* 2.7 2.6   CBC Recent Labs  Lab 09/19/20 0444 09/20/20 0053 09/20/20 1537 09/21/20 0318 09/21/20 1441 09/22/20 0450  WBC 9.7 10.3  --  8.9  --  7.8  HGB 9.3* 8.3* 7.9* 8.2* 8.5* 7.0*  HCT 27.4* 23.8* 24.3* 25.0* 24.8* 21.2*  MCV 87.0 85.6  --  85.9  --  86.5  PLT 114* 109*  --  84*  --  72*      Medications:     sodium chloride   Intravenous Once   allopurinol  100 mg Oral QPM   amiodarone  200 mg Oral BID   amLODipine  10 mg Oral Daily   aspirin EC  325 mg Oral Daily   Or   aspirin  324 mg Per Tube Daily   bisacodyl  10 mg Oral Daily   Or   bisacodyl  10 mg Rectal Daily   chlorhexidine  15 mL Mouth  Rinse BID   Chlorhexidine Gluconate Cloth  6 each Topical Daily   Chlorhexidine Gluconate Cloth  6 each Topical Q0600   cholecalciferol  1,000 Units Oral QPM   cinacalcet  30 mg Oral Q M,W,F-HD   darbepoetin (ARANESP) injection - DIALYSIS  100 mcg Intravenous Q Wed-HD   docusate sodium  200 mg Oral Daily   enoxaparin (LOVENOX) injection  30 mg Subcutaneous Q24H   feeding supplement  237 mL Oral TID BM   ferrous sulfate  325 mg Oral Once per day on Mon Wed Fri   fluticasone  1 spray Each Nare Daily   folic acid  1 mg Oral Daily   Gerhardt's butt cream   Topical BID   hydrocortisone  25 mg Rectal QHS   insulin aspart  0-24 Units Subcutaneous Q4H   irbesartan  300 mg Oral Daily   magnesium oxide  200 mg Oral Daily   mouth rinse  15 mL Mouth Rinse q12n4p   metoprolol tartrate  50 mg Oral BID   montelukast  10 mg Oral Q supper   multivitamin  1 tablet Oral QHS   nystatin  5 mL Oral QID   polyethylene glycol  17 g Oral Daily   polyvinyl alcohol  1 drop Both Eyes TID AC & HS   predniSONE  5 mg Oral Q breakfast   saccharomyces boulardii  250 mg Oral Daily   sodium chloride flush  10-40 mL Intracatheter Q12H   sodium chloride flush  3 mL Intravenous Q12H   umeclidinium bromide  1 puff Inhalation Daily   Outpatient HD orders  OP HD: MWF [facility]  3.5h   3.0/2.5 bath   Hep 3500 bolus and 500 main LUA AVF   400/600 EDW 64 kg (and she was getting to this) ESA: aranesp 90 mcg weekly Other meds: not on calcitriol Allergic to Ferrlecit (no reaction is provided by unit)  Claudia Desanctis, MD 09/22/2020, 6:37 AM

## 2020-09-22 NOTE — Consult Note (Signed)
NAME:  Jodi Bell, MRN:  [id number], DOB:  Aug 22, 1960, LOS: 47 ADMISSION DATE:  09/08/2020, CONSULTATION DATE:  8/13 REFERRING MD:  TCTS, CHIEF COMPLAINT:  post-CABG  History of Present Illness:  This is a 60 year old female, past medical history of ESRD on dialysis, CAD, COPD, type 2 diabetes who was admitted for an NSTEMI, now status post CABG.  Last night she was doing well until around 5 AM.  She started having spasming in her legs right arm flexion head turn to the right became unresponsive.  Felt to have seizure-like activity lasting approximately 1 minute.  Post seizure had left-sided weakness and a code stroke was called.  Patient was seen by neurology and sent down for a CT scan of the head which was negative.  Also had a spot EEG.  Now plan for long-term video EEG.  Critical care was consulted for recommendations and management.  She did receive Ativan and was more somnolent after this event.  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  She had ongoing delirium during the week.  This week her ICU course has been complicated by a GIB with acute blood loss anemia requiring blood transfusions. She has been on pantoprazole infusion since 8/11. Planning for EGD today.  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 8/8 LTM EEG negative, very awake on nasal cannula this morning, neurology has signed off and DC'd MRI and Keppra 8/11 melanic bowel movements, 2 units pRBCs. 8/13 1 unit pRBCs, EGD  Interim History / Subjective:  She denies complaints.  Has had LUE edema since yesterday and mild epigastric and CP.  Objective   Blood pressure (!) 171/75, pulse 70, temperature 98.4 F  (36.9 C), temperature source Oral, resp. rate 12, height '5\' 2"'$  (1.575 m), weight 62.5 kg, SpO2 100 %.        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 Neuro: awake, alert Derm: warm, dry, sternotomy incision healing well  Resolved Hospital Problem list   delirium  Assessment & Plan:   NSTEMI s/p CABG CAD Mild aortic stenosis -post-op care per TCTS -tele monitoring -aspirin, statin, metoprolol, Imdur -amiodarone  HTN -con't irbesartan, metoprolol, amlodipine -adding hydralazine TID  Acute UGIB due to PUD & dieulafoy lesions treated with clips -appreciate GI's assistance; EGD today with clips and epinephrine injection -con't PPI gtt -monitor H/H and stool output -not sure she still needs anusol suppositories  Acute on chronic anemia Acute blood loss anemia -transfuse for Hb<7 or hemodynamically significant bleeding -monitor  ESRD -renally dose meds, avoid nephrotoxic meds -HD per nephrology  Thrombocytopenia -monitor -transfuse of <50 until hemostasis is ensured; may have to increase to 100 if ongoing bleeding  Allergic rhinosinusitis -flonase, cetirizine  Hyperglycemia post-op, controlled -SSI PRN  Best Practice (right click and "Reselect all SmartList Selections" daily)   Diet/type: clear liquids DVT prophylaxis: not indicated- on hold with bleeding GI prophylaxis: PPI Lines: Central line Foley:  N/A Code Status:  full code Last date of multidisciplinary goals of care  discussion '[ ]'$   Labs   CBC: Recent Labs  Lab 09/19/20 0444 09/20/20 0053 09/20/20 1537 09/21/20 0318 09/21/20 1441 09/22/20 0450 09/22/20 0639  WBC 9.7 10.3  --  8.9  --  7.8 8.4  NEUTROABS  --   --   --   --   --   --  5.9  HGB 9.3* 8.3* 7.9* 8.2* 8.5* 7.0* 7.5*  HCT 27.4* 23.8* 24.3* 25.0* 24.8* 21.2* 22.8*  MCV 87.0 85.6  --  85.9  --  86.5 86.7  PLT 114* 109*  --  84*  --  72* 78*    Basic Metabolic Panel: Recent Labs  Lab 09/17/20 0655 09/17/20 1014 09/18/20 0312 09/19/20 0641 09/20/20 0053 09/20/20 1537 09/21/20 0315 09/22/20 0639  NA 131*   < > 133* 134* 134*  --  135 136  K 4.1   < > 3.4* 3.8 4.0  --  4.5 3.8  CL 97*  --  96* 98 97*  --  99 101  CO2 23  --  '29 26 27  '$ --  27 29  GLUCOSE 106*  --  106* 100* 85  --  74 84  BUN 31*  --  19 27* 26*  --  61* 37*  CREATININE 5.41*  --  4.01* 5.34* 4.10*  --  5.25* 3.97*  CALCIUM 9.2  --  9.2 9.8 9.2  --  8.3* 8.4*  MG 1.8  --   --   --   --   --   --   --   PHOS 3.2  3.3  --   --  1.9* 1.2* 2.7 2.6 2.0*   < > = values in this interval not displayed.   GFR: Estimated Creatinine Clearance: 13.1 mL/min (A) (by C-G formula based on SCr of 3.97 mg/dL (H)). Recent Labs  Lab 09/20/20 0053 09/21/20 0318 09/22/20 0450 09/22/20 0639  WBC 10.3 8.9 7.8 8.4    Liver Function Tests: Recent Labs  Lab 09/17/20 0655 09/19/20 0641 09/20/20 0053 09/21/20 0315 09/22/20 0639  ALBUMIN 1.8* 2.0* 1.9* 1.7* 1.8*   No results for input(s): LIPASE, AMYLASE in the last 168 hours. Recent Labs  Lab 09/17/20 1503  AMMONIA 34    ABG    Component Value Date/Time   PHART 7.439 09/17/2020 1014   PCO2ART 43.7 09/17/2020 1014   PO2ART 60 (L) 09/17/2020 1014   HCO3 29.6 (H) 09/17/2020 1014   TCO2 31 09/17/2020 1014   ACIDBASEDEF 1.0 09/13/2020 2152   O2SAT 91.0 09/17/2020 1014     Coagulation Profile: No results for input(s): INR, PROTIME in the last 168 hours.  Cardiac Enzymes: No results for input(s): CKTOTAL,  CKMB, CKMBINDEX, TROPONINI in the last 168 hours.  HbA1C: Hgb A1c MFr Bld  Date/Time Value Ref Range Status  09/13/2020 03:33 AM 5.2 4.8 - 5.6 % Final    Comment:    (NOTE) Pre diabetes:          5.7%-6.4%  Diabetes:              >6.4%  Glycemic control for   <7.0% adults with diabetes   09/09/2020 03:00 AM 5.2 4.8 - 5.6 % Final    Comment:    (  NOTE) Pre diabetes:          5.7%-6.4%  Diabetes:              >6.4%  Glycemic control for   <7.0% adults with diabetes     CBG: Recent Labs  Lab 09/21/20 2023 09/21/20 2305 09/22/20 0456 09/22/20 0752 09/22/20 1005  GLUCAP 79 96 77 74 93    Review of Systems:   Review of Systems  Constitutional:  Negative for chills and fever.  HENT: Negative.    Respiratory:  Negative for cough and shortness of breath.   Cardiovascular:  Positive for chest pain. Negative for leg swelling.  Gastrointestinal:  Positive for blood in stool and melena. Negative for vomiting.  Musculoskeletal: Negative.   Skin:  Negative for rash.  Neurological:  Negative for dizziness and focal weakness.  Endo/Heme/Allergies:  Positive for environmental allergies.    Past Medical History:  She,  has a past medical history of Anemia of chronic disease, Anxiety, Arthritis, Asthma, Back pain, chronic, Candida infection (finished tx 2 days ago), Chronic insomnia, COPD (chronic obstructive pulmonary disease) ([place]), Coronary artery calcification seen on CT scan, Diabetes mellitus, Eczema, ESRD on dialysis ([place]), Essential hypertension, GERD (gastroesophageal reflux disease), Gout, History of hiatal hernia, Hypoalbuminemia, Hyponatremia, Mild aortic stenosis, Mitral regurgitation, Mitral stenosis, Obesity, PAD (peripheral artery disease) ([place]), Pneumonia (few yrs ago x 2), Sleep apnea, Tobacco abuse, and Upper GI bleed (05/2019).   Surgical History:   Past Surgical History:  Procedure Laterality Date   BACK SURGERY  2016   lower back fusion with cage   BIOPSY   09/23/2017   Procedure: BIOPSY;  Surgeon: Wonda Horner, MD;  Location: [facility];  Service: Endoscopy;;   BIOPSY  05/19/2019   Procedure: BIOPSY;  Surgeon: Otis Brace, MD;  Location: [facility];  Service: Gastroenterology;;   CESAREAN SECTION  1990   x 1    COLONOSCOPY WITH PROPOFOL N/A 09/23/2017   Procedure: COLONOSCOPY WITH PROPOFOL Hemostatic clips placed;  Surgeon: Wonda Horner, MD;  Location: [facility];  Service: Endoscopy;  Laterality: N/A;   CORONARY ARTERY BYPASS GRAFT N/A 09/13/2020   Procedure: CORONARY ARTERY BYPASS GRAFTING (CABG), ON PUMP, TIMES TWO, USING LEFT INTERNAL MAMMARY ARTERY AND RIGHT ENDOSCOPICALLY HARVESTED GREATER SAPHENOUS VEIN;  Surgeon: Gaye Pollack, MD;  Location: [facility];  Service: Open Heart Surgery;  Laterality: N/A;   DILATION AND CURETTAGE OF UTERUS  1988   ESOPHAGOGASTRODUODENOSCOPY (EGD) WITH PROPOFOL N/A 09/23/2017   Procedure: ESOPHAGOGASTRODUODENOSCOPY (EGD) WITH PROPOFOL;  Surgeon: Wonda Horner, MD;  [facility]: [facility];  Service: Endoscopy;  Laterality: N/A;   ESOPHAGOGASTRODUODENOSCOPY (EGD) WITH PROPOFOL N/A 05/19/2019   Procedure: ESOPHAGOGASTRODUODENOSCOPY (EGD) WITH PROPOFOL;  Surgeon: Otis Brace, MD;  Location: [facility];  Service: Gastroenterology;  Laterality: N/A;   GIVENS CAPSULE STUDY N/A 05/19/2019   Procedure: GIVENS CAPSULE STUDY;  Surgeon: Otis Brace, MD;  Location: [facility];  Service: Gastroenterology;  Laterality: N/A;   HOT HEMOSTASIS N/A 05/19/2019   Procedure: HOT HEMOSTASIS (ARGON PLASMA COAGULATION/BICAP);  Surgeon: Otis Brace, MD;  Location: [facility];  Service: Gastroenterology;  Laterality: N/A;   LEFT HEART CATH AND CORONARY ANGIOGRAPHY N/A 09/10/2020   Procedure: LEFT HEART CATH AND CORONARY ANGIOGRAPHY;  Surgeon: Nelva Bush, MD;  Location: [facility];  Service: Cardiovascular;  Laterality: N/A;   PARTIAL KNEE ARTHROPLASTY Left 11/12/2017   Procedure: left unicompartmental  arthroplasty-medial;  Surgeon: Paralee Cancel, MD;  Location: [facility];  Service: Orthopedics;  Laterality: Left;  39mn   SUBMUCOSAL INJECTION  09/23/2017   Procedure: SUBMUCOSAL INJECTION;  Surgeon: GWonda Horner MD;  Location: [facility];  Service: Endoscopy;;  in colon   TEE WITHOUT CARDIOVERSION N/A 09/13/2020   Procedure: TRANSESOPHAGEAL ECHOCARDIOGRAM (TEE);  Surgeon: BGaye Pollack MD;  [facility]  Service: Open Heart Surgery;  Laterality: N/A;   TUBAL LIGATION  1990     Social History:   reports that she has been smoking cigarettes. She has a 20.00 pack-year smoking history. She has never used smokeless tobacco. She reports current alcohol use. She reports that she does not use drugs.   Family History:  Her family history includes Arthritis in her daughter and daughter; Asthma in her daughter; Diabetes in her sister; Heart disease in her sister and another family member; Lung cancer in her mother; Thyroid cancer in an other family member.   Allergies Allergies  Allergen Reactions   3-Methyl-2-Benzothiazolinone Hydrazone Other (See Comments)    Muscle weakness muscle cramping in legs Other reaction(s): Myalgias (Muscle Pain)   Banana Anaphylaxis and Swelling    TONGUE AND MOUTH SWELLING   Black Walnut Flavor Anaphylaxis    Walnuts   Hazelnut ([place]) Allergy Skin Test Anaphylaxis    Hazelnuts   Leflunomide And Related Other (See Comments)    Severe Headache   Lisinopril Swelling    Angioedema  Swelling of the face   No [employer] Food Allergies Anaphylaxis    Spicy Mustard 4.7.2021 Pt reports that she eats Regular Yellow Mustard   Other Other (See Comments), Anaphylaxis and Swelling    Cosentyx= angioedema  Other reaction(s): Facial Edema (intolerance) Mouth itches and tongue swelling Hazel nuts and pecans also Walnuts Walnuts Renal failure Other reaction(s): Other Serotonin Syndrome  Serotonin syndrome  Tremors Other reaction(s): Other Tremors Muscle  weakness muscle cramping in legs Other reaction(s): Myalgias (Muscle Pain) Mouth itches and tongue swelling Hazel nuts and pecans also Walnuts    Pecan Extract Allergy Skin Test Anaphylaxis    Pecans    Pecan Nut (Diagnostic) Anaphylaxis    Pecans   Trazodone And Nefazodone Other (See Comments)    Serotonin Syndrome  Tremors   Adalimumab Other (See Comments)    Blisters on abdomen =humira   Escitalopram Oxalate Other (See Comments)    Hand problems - Serotonin Syndrome     Ezetimibe Other (See Comments)    myalgias cramps    Secukinumab Swelling    Cosentyx = angiodema    Statins Other (See Comments)    Leg pains and weakness   Ferrlecit [Na Ferric Gluc Cplx In Sucrose]     Not reaction given from HD   Ibuprofen Other (See Comments)    Renal Problems     Home Medications  Prior to Admission medications   Medication Sig Start Date End Date Taking? Authorizing Provider  acetaminophen (TYLENOL) 500 MG tablet Take 1,000 mg by mouth every 6 (six) hours as needed for mild pain.   Yes [provider]  albuterol (PROVENTIL) (2.5 MG/3ML) 0.083% nebulizer solution Take 2.5 mg by nebulization as needed for wheezing or shortness of breath. 04/15/19  Yes [provider]  allopurinol (ZYLOPRIM) 100 MG tablet Take 100 mg by mouth every evening.   Yes [provider]  amLODipine (NORVASC) 10 MG tablet Take 10 mg by mouth daily.   Yes [provider]  calcium acetate (PHOSLO) 667 MG capsule Take 3 capsules by mouth in the morning and at bedtime. Take 2 capsules with snacks.  10/25/18  Yes [provider]  carboxymethylcellulose 1 % ophthalmic solution Place 1 drop into both eyes 4 (four) times daily.   Yes [provider]  cetirizine (ZYRTEC) 10 MG tablet Take 10 mg by mouth 2 (two) times daily as needed (hives).  06/13/17  Yes Shelda Pal, DO  Cholecalciferol (VITAMIN D3) 10 MCG (400 UNIT) tablet Take 400 Units by mouth every  evening.   Yes [provider]  cloNIDine (CATAPRES) 0.3 MG tablet Take 1 tablet (0.3 mg total) by mouth 3 (three) times daily. 05/21/19  Yes Hosie Poisson, MD  cyclobenzaprine (FLEXERIL) 10 MG tablet Take 10 mg by mouth 3 (three) times daily as needed for muscle spasms. 06/08/20  Yes [provider]  ELDERBERRY PO Take 2 drops by mouth daily.   Yes [provider]  ferrous sulfate 325 (65 FE) MG tablet Take 325 mg by mouth 3 (three) times a week. TUE THUR SAT   Yes [provider]  fluticasone (FLONASE) 50 MCG/ACT nasal spray Place 1 spray into both nostrils daily.   Yes [provider]  folic acid (FOLVITE) [id number] MCG tablet Take 400 mcg by mouth daily.    Yes [provider]  furosemide (LASIX) 40 MG tablet Take 40 mg by mouth 2 (two) times daily.    Yes [provider]  gabapentin (NEURONTIN) 300 MG capsule Take 1 capsule (300 mg total) by mouth at bedtime as needed (Neuropathic pain). Patient taking differently: Take 300 mg by mouth as needed (pain). 12/14/17 10/20/20 Yes Dana Allan I, MD  hydrALAZINE (APRESOLINE) 100 MG tablet Take 1 tablet (100 mg total) by mouth every 8 (eight) hours. 07/06/20  Yes [name], MD  isosorbide mononitrate (IMDUR) 60 MG 24 hr tablet Take 1 tablet (60 mg total) by mouth daily. 03/02/18  Yes Thurnell Lose, MD  labetalol (NORMODYNE) 200 MG tablet Take 400 mg by mouth 3 (three) times daily.   Yes [provider]  LORazepam (ATIVAN) 1 MG tablet Take 1 mg by mouth every 8 (eight) hours as needed for anxiety. 06/25/20  Yes [provider]  montelukast (SINGULAIR) 10 MG tablet Take 10 mg by mouth daily with supper.    Yes [provider]  multivitamin (RENA-VIT) TABS tablet Take 1 tablet by mouth daily with supper.   Yes [provider]  nitroGLYCERIN (NITROSTAT) 0.4 MG SL tablet Place 0.4 mg under the tongue every 5 (five) minutes as needed for chest pain. 08/01/20   Yes [provider]  olmesartan (BENICAR) 40 MG tablet Take 40 mg by mouth every evening. 05/06/19  Yes [provider]  oxyCODONE (ROXICODONE) 15 MG immediate release tablet Take 15 mg by mouth 3 (three) times daily as needed for pain. 05/31/20  Yes [provider]  pantoprazole (PROTONIX) 40 MG tablet Take 40 mg by mouth daily. 06/09/20  Yes [provider]  predniSONE (DELTASONE) 5 MG tablet Take 1 tablet (5 mg total) by mouth daily with breakfast. 07/06/20  Yes [name], MD  Probiotic Product (PROBIOTIC PO) Take 1 capsule by mouth daily.   Yes [provider]  Tiotropium Bromide Monohydrate (SPIRIVA RESPIMAT IN) Inhale 2 puffs into the lungs daily.   Yes [provider]  VENTOLIN HFA 108 (90 Base) MCG/ACT inhaler Inhale 1-2 puffs into the lungs every 6 (six) hours as needed for shortness of breath. wheezing 06/13/15  Yes [provider]  XELJANZ 5 MG TABS Take 5 mg by mouth at bedtime.  05/17/19  Yes [provider]  zolpidem (AMBIEN) 5 MG tablet Take 5 mg by mouth at bedtime. 05/05/19  Yes [provider]  cinacalcet (SENSIPAR) 30 MG tablet Take 30 mg by mouth 3 (three) times a week. Tuesday, Thursday, Saturday. Patient not taking: Reported on 09/09/2020    [provider]  EPINEPHrine (EPIPEN) 0.3 mg/0.3 mL SOAJ injection Inject 0.3 mLs (0.3 mg total) into the muscle once. Patient taking differently: Inject 0.3 mg into the muscle as needed (for allergic reaction). 09/30/12   Le, Thao P, DO  JANUVIA 25 MG tablet Take 1 tablet (25 mg total) by mouth daily. Patient not taking: Reported on 09/09/2020 07/06/20   [name], MD     Critical care time: 41 min.     Julian Hy, DO 09/22/20 1:49 PM [facility]

## 2020-09-22 NOTE — Anesthesia Preprocedure Evaluation (Addendum)
Anesthesia Evaluation  Patient identified by MRN, date of birth, ID band Patient awake    Reviewed: Allergy & Precautions, NPO status , Patient's Chart, lab work & pertinent test results  History of Anesthesia Complications Negative for: history of anesthetic complications  Airway Mallampati: II  TM Distance: >3 FB Neck ROM: Full    Dental  (+) Edentulous Upper, Edentulous Lower   Pulmonary asthma , sleep apnea , COPD,  COPD inhaler, Current Smoker and Patient abstained from smoking.,    Pulmonary exam normal        Cardiovascular hypertension, Pt. on home beta blockers and Pt. on medications + CAD, + Past MI, + CABG and + Peripheral Vascular Disease  Normal cardiovascular exam   '22 TTE - EF 60%. Mild concentric left ventricular hypertrophy with severe hypetrophy of the basal-septal segment. A small pericardial effusion is present. The pericardial effusion is circumferential. Mild mitral valve regurgitation. Mild aortic valve stenosis. Aortic valve mean gradient measures 24.0 mmHg. Aortic valve Vmax measures 3.26 m/s. Left atrial size was severely dilated.     Neuro/Psych  Headaches, PSYCHIATRIC DISORDERS Anxiety    GI/Hepatic Neg liver ROS, hiatal hernia, GERD  Controlled,  Endo/Other  diabetes, Type 2, Oral Hypoglycemic Agents  Renal/GU ESRF and DialysisRenal disease     Musculoskeletal  (+) Arthritis ,   Abdominal   Peds  Hematology negative hematology ROS (+)   Anesthesia Other Findings   Reproductive/Obstetrics                            Anesthesia Physical Anesthesia Plan  ASA: 3  Anesthesia Plan: MAC   Post-op Pain Management:    Induction:   PONV Risk Score and Plan: 2 and Propofol infusion and Treatment may vary due to age or medical condition  Airway Management Planned: Nasal Cannula and Natural Airway  Additional Equipment: None  Intra-op Plan:   Post-operative  Plan:   Informed Consent: I have reviewed the patients History and Physical, chart, labs and discussed the procedure including the risks, benefits and alternatives for the proposed anesthesia with the patient or authorized representative who has indicated his/her understanding and acceptance.       Plan Discussed with: CRNA and Anesthesiologist  Anesthesia Plan Comments:        Anesthesia Quick Evaluation

## 2020-09-23 ENCOUNTER — Inpatient Hospital Stay (HOSPITAL_COMMUNITY): Payer: Medicare PPO

## 2020-09-23 DIAGNOSIS — K922 Gastrointestinal hemorrhage, unspecified: Secondary | ICD-10-CM | POA: Diagnosis not present

## 2020-09-23 DIAGNOSIS — Z951 Presence of aortocoronary bypass graft: Secondary | ICD-10-CM | POA: Diagnosis not present

## 2020-09-23 DIAGNOSIS — D62 Acute posthemorrhagic anemia: Secondary | ICD-10-CM | POA: Diagnosis not present

## 2020-09-23 LAB — RENAL FUNCTION PANEL
Albumin: 1.9 g/dL — ABNORMAL LOW (ref 3.5–5.0)
Anion gap: 8 (ref 5–15)
BUN: 42 mg/dL — ABNORMAL HIGH (ref 6–20)
CO2: 26 mmol/L (ref 22–32)
Calcium: 8.9 mg/dL (ref 8.9–10.3)
Chloride: 97 mmol/L — ABNORMAL LOW (ref 98–111)
Creatinine, Ser: 4.9 mg/dL — ABNORMAL HIGH (ref 0.44–1.00)
GFR, Estimated: 10 mL/min — ABNORMAL LOW (ref >60)
Glucose, Bld: 105 mg/dL — ABNORMAL HIGH (ref 70–99)
Phosphorus: 3.8 mg/dL (ref 2.5–4.6)
Potassium: 4.6 mmol/L (ref 3.5–5.1)
Sodium: 131 mmol/L — ABNORMAL LOW (ref 135–145)

## 2020-09-23 LAB — TYPE AND SCREEN
ABO/RH(D): AB POS
Antibody Screen: NEGATIVE
Unit division: 0
Unit division: 0
Unit division: 0

## 2020-09-23 LAB — CBC
HCT: 26.1 % — ABNORMAL LOW (ref 36.0–46.0)
Hemoglobin: 8.4 g/dL — ABNORMAL LOW (ref 12.0–15.0)
MCH: 28.6 pg (ref 26.0–34.0)
MCHC: 32.2 g/dL (ref 30.0–36.0)
MCV: 88.8 fL (ref 80.0–100.0)
Platelets: 75 10*3/uL — ABNORMAL LOW (ref 150–400)
RBC: 2.94 MIL/uL — ABNORMAL LOW (ref 3.87–5.11)
RDW: 22.4 % — ABNORMAL HIGH (ref 11.5–15.5)
WBC: 8.5 10*3/uL (ref 4.0–10.5)
nRBC: 0 % (ref 0.0–0.2)

## 2020-09-23 LAB — BPAM RBC
Blood Product Expiration Date: 202209062359
Blood Product Expiration Date: 202209062359
Blood Product Expiration Date: 202209122359
ISSUE DATE / TIME: 202208112153
ISSUE DATE / TIME: 202208120125
ISSUE DATE / TIME: 202208130709
Unit Type and Rh: 6200
Unit Type and Rh: 6200
Unit Type and Rh: 8400

## 2020-09-23 LAB — GLUCOSE, CAPILLARY
Glucose-Capillary: 97 mg/dL (ref 70–99)
Glucose-Capillary: 98 mg/dL (ref 70–99)

## 2020-09-23 MED ORDER — CLONIDINE HCL 0.1 MG PO TABS
0.1000 mg | ORAL_TABLET | Freq: Two times a day (BID) | ORAL | Status: DC
  Administered 2020-09-23 – 2020-10-04 (×20): 0.1 mg via ORAL
  Filled 2020-09-23 (×23): qty 1

## 2020-09-23 MED ORDER — PANTOPRAZOLE SODIUM 40 MG IV SOLR
40.0000 mg | Freq: Two times a day (BID) | INTRAVENOUS | Status: DC
  Administered 2020-09-23 – 2020-09-26 (×7): 40 mg via INTRAVENOUS
  Filled 2020-09-23 (×8): qty 40

## 2020-09-23 MED ORDER — ISOSORBIDE MONONITRATE ER 30 MG PO TB24
30.0000 mg | ORAL_TABLET | Freq: Every day | ORAL | Status: DC
  Administered 2020-09-23 – 2020-10-04 (×12): 30 mg via ORAL
  Filled 2020-09-23 (×12): qty 1

## 2020-09-23 MED ORDER — CHLORHEXIDINE GLUCONATE CLOTH 2 % EX PADS
6.0000 | MEDICATED_PAD | Freq: Every day | CUTANEOUS | Status: DC
  Administered 2020-09-25 – 2020-09-29 (×4): 6 via TOPICAL

## 2020-09-23 MED ORDER — ISOSORBIDE MONONITRATE ER 60 MG PO TB24: 60.0000 mg | ORAL_TABLET | Freq: Every day | ORAL | Status: DC

## 2020-09-23 MED ORDER — IOHEXOL 350 MG/ML SOLN
100.0000 mL | Freq: Once | INTRAVENOUS | Status: AC | PRN
  Administered 2020-09-23: 100 mL via INTRAVENOUS

## 2020-09-23 MED ORDER — HYDRALAZINE HCL 50 MG PO TABS
100.0000 mg | ORAL_TABLET | Freq: Three times a day (TID) | ORAL | Status: DC
  Administered 2020-09-23 – 2020-10-04 (×24): 100 mg via ORAL
  Filled 2020-09-23 (×29): qty 2

## 2020-09-23 NOTE — Progress Notes (Signed)
Patient ID: Jodi Bell, female   DOB: Apr 28, 1960, 60 y.o.   MRN: [id number]  [facility] Progress Note   Assessment/ Plan:   1.  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    2. ESRD: On hemodialysis on a Monday/Wednesday/Friday schedule as an outpatient  - Continue HD per her MWF schedule; can go to HD unit for HD from my standpoint - added fluid restriction - elevate left arm - some swelling after last HD; no pain  3. Anemia CKD as well as acute blood loss: continue ESA  - She got aranesp 100 mcg on 8/10 after the dose was missed the preceding week.  She had been getting outpatient and tolerating.  gave 1 unit PRBC's on 8/13  4. HTN -  on 8/10 changed to home med labetalol (ok with CTS). (reported home dose is 400 mg TID).  Given issues with tachycardia changed back to metoprolol.  On irbesartan 300 mg daily (home med olmesartan 40 mg daily - not on formulary here and per pharmacy equivalent).  Restart clonidine at reduced dose of 0.1 mg BID.  Back on imdur at reduced dose of 30 mg daily.  Optimize volume with HD.  Weights variable and unsure of accuracy.  With her nausea and tachycardia she may not tolerate her lowest weight.  5. Tachycardia/? afib - amio per primary team.  Changed back to metoprolol from labetalol and increased dose to metoprolol tartrate 50 mg BID. If recurs would consult cardiology  6. GI bleed - GI consulted and pt is s/p EGD -  two dieulafoy lesions noted and ulcer with visible vessel.  She is s/p PRBC's. 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  Repleted phos earlier.  On sensipar with HD  8. Acute hypoxic resp failure - on supplemental oxygen. Optimize volume with HD  9.  Acute neurological changes: possible seizure and  MRI deferred per neurology.  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  Per charting improved after  HD  Dispo - inpatient monitoring; she is in ICU  Subjective:   Last HD on 8/12 with 1.5 kg UF.  S/p EGD on 8/13 with clip; two dieulafoy lesions noted and ulcer with visible vessel.  She is on clonidine (she recognizes name catapres) 0.3 mg BID and has been on clonidine a while.   Review of systems:  Denies shortness of breath at rest or chest pain; some shortness of breath lying flat Denies nausea today  dark BM overnight per RN and pt   Objective:   BP (!) 182/71   Pulse 65   Temp 98.2 F (36.8 C) (Oral)   Resp 13   Ht '5\' 2"'$  (1.575 m)   Wt 66 kg   SpO2 100%   BMI 26.61 kg/m   Physical Exam:    General adult female in bed in no acute distress HEENT normocephalic atraumatic sclera anicteric Neck supple trachea midline Lungs clear to auscultation bilaterally normal work of breathing at rest on room air Heart S1S2 no rub Abdomen soft nontender nondistended Extremities no edema  Psych normal mood and affect Neuro - alert and oriented x 3 provides hx and follows commands Access LUE AVF bruit and thrill; left arm swelling noted - no pain  Labs: BMET Recent Labs  Lab 09/17/20 0655 09/17/20 1014 09/18/20 0312 09/19/20 [id number] 09/20/20 0053 09/20/20 1537 09/21/20 0315 09/22/20 0639 09/23/20 0410  NA  131* 134* 133* 134* 134*  --  135 136 131*  K 4.1 2.9* 3.4* 3.8 4.0  --  4.5 3.8 4.6  CL 97*  --  96* 98 97*  --  99 101 97*  CO2 23  --  '29 26 27  '$ --  '27 29 26  '$ GLUCOSE 106*  --  106* 100* 85  --  74 84 105*  BUN 31*  --  19 27* 26*  --  61* 37* 42*  CREATININE 5.41*  --  4.01* 5.34* 4.10*  --  5.25* 3.97* 4.90*  CALCIUM 9.2  --  9.2 9.8 9.2  --  8.3* 8.4* 8.9  PHOS 3.2  3.3  --   --  1.9* 1.2* 2.7 2.6 2.0* 3.8   CBC Recent Labs  Lab 09/21/20 0318 09/21/20 1441 09/22/20 0450 09/22/20 0639 09/22/20 1410 09/22/20 2135 09/23/20 0441  WBC 8.9  --  7.8 8.4  --   --  8.5  NEUTROABS  --   --   --  5.9  --   --   --   HGB 8.2*   < > 7.0* 7.5* 9.0* 8.7* 8.4*  HCT  25.0*   < > 21.2* 22.8* 27.2* 26.4* 26.1*  MCV 85.9  --  86.5 86.7  --   --  88.8  PLT 84*  --  72* 78*  --   --  75*   < > = values in this interval not displayed.      Medications:     sodium chloride   Intravenous Once   allopurinol  100 mg Oral QPM   amiodarone  200 mg Oral BID   amLODipine  10 mg Oral Daily   aspirin EC  325 mg Oral Daily   Or   aspirin  324 mg Per Tube Daily   bisacodyl  10 mg Oral Daily   Or   bisacodyl  10 mg Rectal Daily   chlorhexidine  15 mL Mouth Rinse BID   Chlorhexidine Gluconate Cloth  6 each Topical Daily   Chlorhexidine Gluconate Cloth  6 each Topical Q0600   cholecalciferol  1,000 Units Oral QPM   cinacalcet  30 mg Oral Q M,W,F-HD   darbepoetin (ARANESP) injection - DIALYSIS  100 mcg Intravenous Q Wed-HD   docusate sodium  200 mg Oral Daily   enoxaparin (LOVENOX) injection  30 mg Subcutaneous Q24H   feeding supplement  237 mL Oral TID BM   ferrous sulfate  325 mg Oral Once per day on Mon Wed Fri   fluticasone  1 spray Each Nare Daily   folic acid  1 mg Oral Daily   Gerhardt's butt cream   Topical BID   hydrALAZINE  50 mg Oral Q8H   hydrocortisone  25 mg Rectal QHS   insulin aspart  0-24 Units Subcutaneous Q4H   irbesartan  300 mg Oral Daily   isosorbide mononitrate  30 mg Oral Daily   magnesium oxide  200 mg Oral Daily   mouth rinse  15 mL Mouth Rinse q12n4p   metoprolol tartrate  50 mg Oral BID   montelukast  10 mg Oral Q supper   multivitamin  1 tablet Oral QHS   nystatin  5 mL Oral QID   polyethylene glycol  17 g Oral Daily   polyvinyl alcohol  1 drop Both Eyes TID AC & HS   predniSONE  5 mg Oral Q breakfast   saccharomyces boulardii  250 mg Oral Daily  sodium chloride flush  10-40 mL Intracatheter Q12H   sodium chloride flush  3 mL Intravenous Q12H   umeclidinium bromide  1 puff Inhalation Daily   Outpatient HD orders  OP HD: MWF [facility] ([facility])  3.5h   3.0/2.5 bath   Hep 3500 bolus and 500 main LUA AVF    400/600 EDW 64 kg (and she was getting to this) ESA: aranesp 90 mcg weekly Other meds: not on calcitriol Allergic to Ferrlecit (no reaction is provided by unit)  Claudia Desanctis, MD 09/23/2020, 6:46 AM

## 2020-09-23 NOTE — TOC Progression Note (Signed)
Transition of Care [facility]) - Progression Note    Patient Details  Name: Jodi Bell MRN: [id number] Date of Birth: 1960/05/19  Transition of Care [facility] CM/SW Contact  Elliot Gurney [place], [place] Phone Number: [phone] 09/23/2020, 11:05 AM  Clinical Narrative:     [place] faxed out to facilities in the [place] area. Bed offers to be reviewed once received.  [address] LCSW Transition of Care [phone]   Expected Discharge Plan: Skilled Nursing Facility Barriers to Discharge: Continued Medical Work up  Expected Discharge Plan and Services Expected Discharge Plan: [place] In-house Referral: Clinical Social Work     Living arrangements for the past 2 months: Single Family Home                                       Social Determinants of Health (SDOH) Interventions    Readmission Risk Interventions No flowsheet data found.

## 2020-09-23 NOTE — Progress Notes (Signed)
Patient ID: Jodi Bell, female   DOB: 01-11-1961, 59 y.o.   MRN: [id number] TCTS Evening Rounds:  Hypertensive.  Has developed LUE and left breast swelling. She has left IJ CVC and may have developed DVT. She has LUE AV fistula. CTA with venous phase contrast ordered. I don't think she can safely be anticoagulated with her recent gastric bleeding.

## 2020-09-23 NOTE — Progress Notes (Signed)
[facility] Progress Note Patient Name: Jodi Bell DOB: 05-23-1960 MRN: [id number]   Date of Service  09/23/2020  HPI/Events of Note  Notified by bedside RN of CTA results with no evidence of DVT related to the L IJ catheter. But given that there is edema, we would not be utilizing the IJ at this time.  Pt has peripheral access.  Pt has dialysis in the morning. AVF is in the left arm.  eICU Interventions  Remove IJ catheter. Am labs ordered.     Intervention Category Intermediate Interventions: Other:  Elsie Lincoln 09/23/2020, 11:54 PM

## 2020-09-23 NOTE — NC FL2 (Signed)
[place] LEVEL OF CARE SCREENING TOOL     IDENTIFICATION  Patient Name: Jodi Bell Birthdate: Apr 11, 1960 Sex: female Admission Date (Current Location): 09/08/2020  [facility] and [place] Number:  Herbalist and Address:  [facility], [place] [address], [place], [place] [address]      Provider Number: [id number]  Attending Physician Name and Address:  Gaye Pollack, MD  Relative Name and Phone Number:  Bartolo Darter [phone]    Current Level of Care: Hospital Recommended Level of Care: [place] Prior Approval Number:    Date Approved/Denied:   PASRR Number: [id number]  Discharge Plan: SNF    Current Diagnoses: Patient Active Problem List   Diagnosis Date Noted   Pressure injury of skin 09/21/2020   Malnutrition of moderate degree 09/20/2020   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/13/2020   Non-ST elevation (NSTEMI) myocardial infarction [facility])    Pulmonary edema 09/08/2020   CHF (congestive heart failure) ([place]) 09/08/2020   Hypertensive urgency    Dyspnea 07/03/2020   GI bleeding 05/17/2019   Volume overload state of heart 09/14/2018   Intervertebral cervical disc disorder with myelopathy, cervical region 09/14/2018   OSA (obstructive sleep apnea) 09/14/2018   Asthma 05/16/2018   Suspected COVID-19 virus infection 05/16/2018   ESRD (end stage renal disease) ([place]) 05/16/2018   Fluid overload 02/27/2018   Chest pain, rule out acute myocardial infarction 12/12/2017   Status post left partial knee replacement 11/12/2017   Hematochezia 09/20/2017   GERD (gastroesophageal reflux disease) 09/20/2017   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 2 diabetes mellitus ([place]) 09/20/2017   COPD (chronic obstructive pulmonary disease) ([place]) 09/20/2017   Tobacco use 09/20/2017   Symptomatic anemia 09/20/2017   Headache 09/20/2017   Serotonin syndrome 09/28/2013   Acute respiratory failure with hypoxia (HCC) 09/28/2013   Encephalopathy acute 09/28/2013    Hypotension, unspecified 09/28/2013   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   Type 2 diabetes mellitus ([place]) 03/06/2008   INSOMNIA, CHRONIC 03/06/2008   Essential hypertension 03/06/2008   ASTHMA 03/06/2008   ECZEMA 03/06/2008   BACK PAIN, LUMBAR, CHRONIC 03/06/2008    Orientation RESPIRATION BLADDER Height & Weight     Self, Time, Situation, Place  Normal Continent Weight: 145 lb 8.1 oz (66 kg) Height:  '5\' 2"'$  (157.5 cm)  BEHAVIORAL SYMPTOMS/MOOD NEUROLOGICAL BOWEL NUTRITION STATUS      Continent Diet  AMBULATORY STATUS COMMUNICATION OF NEEDS Skin   Limited Assist Verbally Other (Comment) (buttocks medial bilateral stage 2, incision closed chest right leg)                       Personal Care Assistance Level of Assistance  Bathing, Feeding Bathing Assistance: Limited assistance Feeding assistance: Independent (able to feed self) Dressing Assistance: Limited assistance     Functional Limitations Info  Sight, Hearing, Speech Sight Info: Adequate Hearing Info: Adequate Speech Info: Adequate    SPECIAL CARE FACTORS FREQUENCY  PT (By licensed PT), OT (By licensed OT)     PT Frequency: 5x per week OT Frequency: 5x per week            Contractures Contractures Info: Not present    Additional Factors Info  Code Status Code Status Info: FULL             Current Medications (09/23/2020):  This is the current hospital active medication list Current Facility-Administered Medications  Medication Dose Route Frequency Provider Last Rate Last Admin  0.9 %  sodium chloride infusion (Manually program via Guardrails IV Fluids)   Intravenous Once Arta Silence, MD       0.9 %  sodium chloride infusion  100 mL Intravenous PRN Arta Silence, MD       0.9 %  sodium chloride infusion  250 mL Intravenous PRN Arta Silence, MD       albuterol (VENTOLIN HFA) 108 (90 Base) MCG/ACT inhaler 1-2 puff  1-2 puff Inhalation Q6H PRN Arta Silence, MD       allopurinol  (ZYLOPRIM) tablet 100 mg  100 mg Oral QPM Arta Silence, MD   100 mg at 09/22/20 1739   alteplase (CATHFLO ACTIVASE) injection 2 mg  2 mg Intracatheter Once PRN Arta Silence, MD       amiodarone (PACERONE) tablet 200 mg  200 mg Oral BID Arta Silence, MD   200 mg at 09/23/20 0918   amLODipine (NORVASC) tablet 10 mg  10 mg Oral Daily Arta Silence, MD   10 mg at 09/23/20 0919   chlorhexidine (PERIDEX) 0.12 % solution 15 mL  15 mL Mouth Rinse BID Arta Silence, MD   15 mL at 09/23/20 [id number]   Chlorhexidine Gluconate Cloth 2 % PADS 6 each  6 each Topical Daily Arta Silence, MD   6 each at 09/20/20 1036   Chlorhexidine Gluconate Cloth 2 % PADS 6 each  6 each Topical Q0600 Arta Silence, MD   6 each at 09/22/20 0529   cholecalciferol (VITAMIN D3) tablet 1,000 Units  1,000 Units Oral QPM Arta Silence, MD   1,000 Units at 09/22/20 1740   cinacalcet (SENSIPAR) tablet 30 mg  30 mg Oral Q M,W,F-HD Arta Silence, MD   30 mg at 09/21/20 1132   cloNIDine (CATAPRES) tablet 0.1 mg  0.1 mg Oral BID Claudia Desanctis, MD   0.1 mg at 09/23/20 0920   Darbepoetin Alfa (ARANESP) injection 100 mcg  100 mcg Intravenous Q Wed-HD Arta Silence, MD   100 mcg at 09/19/20 1653   docusate sodium (COLACE) capsule 200 mg  200 mg Oral Daily Arta Silence, MD   200 mg at 09/20/20 1035   feeding supplement (ENSURE ENLIVE / ENSURE PLUS) liquid 237 mL  237 mL Oral TID BM Arta Silence, MD   237 mL at 09/21/20 2100   fentaNYL (SUBLIMAZE) injection 25 mcg  25 mcg Intravenous Q2H PRN Arta Silence, MD   25 mcg at 09/22/20 0750   ferrous sulfate tablet 325 mg  325 mg Oral Once per day on Mon Wed Fri Outlaw, William, MD   325 mg at 09/21/20 0844   fluticasone (FLONASE) 50 MCG/ACT nasal spray 1 spray  1 spray Each Nare Daily Arta Silence, MD   1 spray at [id number]   folic acid (FOLVITE) tablet 1 mg  1 mg Oral Daily Arta Silence, MD   1 mg at 09/23/20 [id number]   food thickener (SIMPLYTHICK (NECTAR/LEVEL 2/MILDLY  THICK)) 5 packet  5 packet Oral PRN Arta Silence, MD       gabapentin (NEURONTIN) capsule 300 mg  300 mg Oral QHS PRN Arta Silence, MD   300 mg at 09/21/20 2129   Gerhardt's butt cream   Topical BID Arta Silence, MD   Given at 09/23/20 0924   heparin injection 1,000 Units  1,000 Units Dialysis PRN Arta Silence, MD       heparin injection 2,000 Units  2,000 Units Dialysis PRN Arta Silence, MD       heparin injection 3,000  Units  3,000 Units Dialysis PRN Arta Silence, MD       hydrALAZINE (APRESOLINE) tablet 50 mg  50 mg Oral Q8H ClarkMickel Baas P, DO   50 mg at 09/23/20 [id number]   hydrocortisone (ANUSOL-HC) suppository 25 mg  25 mg Rectal QHS Arta Silence, MD   25 mg at 09/22/20 2119   irbesartan (AVAPRO) tablet 300 mg  300 mg Oral Daily Arta Silence, MD   300 mg at 09/23/20 [id number]   isosorbide mononitrate (IMDUR) 24 hr tablet 30 mg  30 mg Oral Daily Claudia Desanctis, MD   30 mg at 09/23/20 [id number]   labetalol (NORMODYNE) injection 20 mg  20 mg Intravenous Q2H PRN Arta Silence, MD   20 mg at 09/23/20 0426   lidocaine-prilocaine (EMLA) cream 1 application  1 application Topical PRN Arta Silence, MD       magnesium oxide (MAG-OX) tablet 200 mg  200 mg Oral Daily Arta Silence, MD   200 mg at 09/23/20 [id number]   MEDLINE mouth rinse  15 mL Mouth Rinse q12n4p Arta Silence, MD   15 mL at 09/22/20 1200   metoprolol tartrate (LOPRESSOR) injection 2.5-5 mg  2.5-5 mg Intravenous Q2H PRN Arta Silence, MD   5 mg at 09/21/20 0032   metoprolol tartrate (LOPRESSOR) tablet 50 mg  50 mg Oral BID Arta Silence, MD   50 mg at 09/23/20 0925   montelukast (SINGULAIR) tablet 10 mg  10 mg Oral Q supper Arta Silence, MD   10 mg at 09/22/20 1739   multivitamin (RENA-VIT) tablet 1 tablet  1 tablet Oral Michell Heinrich, MD   1 tablet at 09/22/20 2118   nystatin (MYCOSTATIN) 100000 UNIT/ML suspension 500,000 Units  5 mL Oral QID Arta Silence, MD   500,000 Units at 09/21/20 2128   ondansetron  (ZOFRAN) injection 4 mg  4 mg Intravenous Q6H PRN Arta Silence, MD   4 mg at 09/20/20 [id number]   oxyCODONE (Oxy IR/ROXICODONE) immediate release tablet 5-10 mg  5-10 mg Oral Q3H PRN Arta Silence, MD   5 mg at 09/23/20 0751   pantoprozole (PROTONIX) 80 mg /NS 100 mL infusion  8 mg/hr Intravenous Continuous Arta Silence, MD 10 mL/hr at 09/23/20 0025 8 mg/hr at 09/23/20 0025   pentafluoroprop-tetrafluoroeth (GEBAUERS) aerosol 1 application  1 application Topical PRN Arta Silence, MD       polyethylene glycol (MIRALAX / GLYCOLAX) packet 17 g  17 g Oral Daily Arta Silence, MD   17 g at 09/20/20 1035   polyvinyl alcohol (LIQUIFILM TEARS) 1.4 % ophthalmic solution 1 drop  1 drop Both Eyes TID AC & HS Arta Silence, MD   1 drop at 09/23/20 0917   predniSONE (DELTASONE) tablet 5 mg  5 mg Oral Q breakfast Arta Silence, MD   5 mg at 09/23/20 [id number]   saccharomyces boulardii (FLORASTOR) capsule 250 mg  250 mg Oral Daily Arta Silence, MD   250 mg at 09/23/20 0926   sodium chloride flush (NS) 0.9 % injection 10-40 mL  10-40 mL Intracatheter Q12H Arta Silence, MD   10 mL at 09/23/20 0926   sodium chloride flush (NS) 0.9 % injection 10-40 mL  10-40 mL Intracatheter PRN Arta Silence, MD       sodium chloride flush (NS) 0.9 % injection 3 mL  3 mL Intravenous Q12H Arta Silence, MD   3 mL at 09/23/20 0927   sodium chloride flush (NS) 0.9 % injection 3 mL  3 mL Intravenous PRN Arta Silence,  MD       umeclidinium bromide (INCRUSE ELLIPTA) 62.5 MCG/INH 1 puff  1 puff Inhalation Daily Arta Silence, MD   1 puff at 09/19/20 0932     Discharge Medications: Please see discharge summary for a list of discharge medications.  Relevant Imaging Results:  Relevant Lab Results:   Additional Information SS# [id number]  Elliot Gurney [place], [place]

## 2020-09-23 NOTE — Progress Notes (Signed)
1 Day Post-Op Procedure(s) (LRB): ESOPHAGOGASTRODUODENOSCOPY (EGD) (N/A) HEMOSTASIS CONTROL HEMOSTASIS CLIP PLACEMENT Subjective:  No specific complaints. Taking clear liquids. Ambulated a little.  Still passing some melena.  Objective: Vital signs in last 24 hours: Temp:  [97.6 F (36.4 C)-99.2 F (37.3 C)] 98.9 F (37.2 C) (08/14 0650) Pulse Rate:  [64-87] 67 (08/14 0630) Cardiac Rhythm: Normal sinus rhythm (08/14 0400) Resp:  [7-23] 9 (08/14 0630) BP: (137-189)/(61-97) 187/74 (08/14 0630) SpO2:  [91 %-100 %] 99 % (08/14 0630) Weight:  [66 kg] 66 kg (08/14 0500)  Hemodynamic parameters for last 24 hours:    Intake/Output from previous day: 08/13 0701 - 08/14 0700 In: 1626.1 [P.O.:840; I.V.:273.3; Blood:337.5; IV Piggyback:175.2] Out: -  Intake/Output this shift: No intake/output data recorded.  General appearance: alert and cooperative Neurologic: intact Heart: regular rate and rhythm Lungs: clear to auscultation bilaterally Abdomen: soft, non-tender; bowel sounds normal Extremities: no edema Wound: incision ok  Lab Results: Recent Labs    09/22/20 0639 09/22/20 1410 09/22/20 2135 09/23/20 0441  WBC 8.4  --   --  8.5  HGB 7.5*   < > 8.7* 8.4*  HCT 22.8*   < > 26.4* 26.1*  PLT 78*  --   --  75*   < > = values in this interval not displayed.   BMET:  Recent Labs    09/22/20 0639 09/23/20 0410  NA 136 131*  K 3.8 4.6  CL 101 97*  CO2 29 26  GLUCOSE 84 105*  BUN 37* 42*  CREATININE 3.97* 4.90*  CALCIUM 8.4* 8.9    PT/INR: No results for input(s): LABPROT, INR in the last 72 hours. ABG    Component Value Date/Time   PHART 7.439 09/17/2020 1014   HCO3 29.6 (H) 09/17/2020 1014   TCO2 31 09/17/2020 1014   ACIDBASEDEF 1.0 09/13/2020 2152   O2SAT 91.0 09/17/2020 1014   CBG (last 3)  Recent Labs    09/22/20 2354 09/23/20 0429 09/23/20 0652  GLUCAP 105* 97 98    Assessment/Plan:   POD 1 S/P Procedure(s) (LRB): ESOPHAGOGASTRODUODENOSCOPY  (EGD) (N/A) HEMOSTASIS CONTROL HEMOSTASIS CLIP PLACEMENT  POD 10 CABG  Hypertensive. Medications being adjusted by nephrology.  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 Will stop Lovenox and ASA for now and then ASA 81 mg can be resumed at discharge. Continue IV protonix per GI. Advance diet as GI feels appropriate. Follow Hgb.  HD per nephrology.  Seems neurologically back to baseline.  Postop atrial fib: maintaining sinus on po amiodarone.  IS, OOB, Ambulation.    LOS: 15 days    Gaye Pollack 09/23/2020

## 2020-09-23 NOTE — Progress Notes (Signed)
Subjective: Passing some melena yesterday, none today. No abdominal pain.  Objective: Vital signs in last 24 hours: Temp:  [98.2 F (36.8 C)-99.2 F (37.3 C)] 98.3 F (36.8 C) (08/14 1201) Pulse Rate:  [64-87] 73 (08/14 1230) Resp:  [7-22] 12 (08/14 1230) BP: (140-209)/(66-97) 180/74 (08/14 1200) SpO2:  [91 %-100 %] 98 % (08/14 1230) Weight:  [66 kg] 66 kg (08/14 0500) Weight change: 3.1 kg Last BM Date: 09/22/20  PE: GEN:  NAD  Lab Results: CBC    Component Value Date/Time   WBC 8.5 09/23/2020 0441   RBC 2.94 (L) 09/23/2020 0441   HGB 8.4 (L) 09/23/2020 0441   HCT 26.1 (L) 09/23/2020 0441   PLT 75 (L) 09/23/2020 0441   MCV 88.8 09/23/2020 0441   MCV 94.5 10/12/2012 1211   MCH 28.6 09/23/2020 0441   MCHC 32.2 09/23/2020 0441   RDW 22.4 (H) 09/23/2020 0441   LYMPHSABS 1.4 09/22/2020 0639   MONOABS 0.7 09/22/2020 0639   EOSABS 0.3 09/22/2020 0639   BASOSABS 0.0 09/22/2020 0639  CMP     Component Value Date/Time   NA 131 (L) 09/23/2020 0410   K 4.6 09/23/2020 0410   CL 97 (L) 09/23/2020 0410   CO2 26 09/23/2020 0410   GLUCOSE 105 (H) 09/23/2020 0410   BUN 42 (H) 09/23/2020 0410   CREATININE 4.90 (H) 09/23/2020 0410   CREATININE 1.34 (H) 09/18/2011 1400   CALCIUM 8.9 09/23/2020 0410   PROT 4.0 (L) 09/13/2020 1940   ALBUMIN 1.9 (L) 09/23/2020 0410   AST 16 09/13/2020 1940   ALT 9 09/13/2020 1940   ALKPHOS 45 09/13/2020 1940   BILITOT 0.5 09/13/2020 1940   GFRNONAA 10 (L) 09/23/2020 0410   GFRAA 16 (L) 11/09/2019 1152   Assessment:   Acute blood loss anemia.  Blood in stool.  Several discrete bleeding lesions in stomach treated with EGD yesterday (EPI  + clips).  I am concerned patient may have diffuse oozing that may not be limited to the sites treated yesterday.  Plan:   Trial soft diet.  Minimize anticoagulants as clinically feasible.  Continue PPI.  If ongoing bleeding and drop/Hgb, would consider repeat EGD, but I have concern that her bleeding may  be of a more diffuse and non-specific nature not necessarily limited to discrete bleeding sites identified yesterday.  [facility] will follow.   Landry Dyke 09/23/2020, 1:42 PM   Cell [phone] If no answer or after 5 PM call [phone]

## 2020-09-23 NOTE — Progress Notes (Signed)
Now having left breast swelling in addition to LUE edema. Concerned for DVT a/w LIJ CVC. Vascular US ordered. Will get CT with venous phased contrast to evaluate for central clot. CVC needs to be removed.  Julian Hy, DO 09/23/20 5:20 PM [facility]

## 2020-09-23 NOTE — Progress Notes (Signed)
NAME:  Jodi Bell, MRN:  [id number], DOB:  [birth year], LOS: 84 ADMISSION DATE:  09/08/2020, CONSULTATION DATE:  8/13 REFERRING MD:  TCTS, CHIEF COMPLAINT:  post-CABG  History of Present Illness:  This is a 60 year old female, past medical history of ESRD on dialysis, CAD, COPD, type 2 diabetes who was admitted for an NSTEMI, now status post CABG.  Last night she was doing well until around 5 AM.  She started having spasming in her legs right arm flexion head turn to the right became unresponsive.  Felt to have seizure-like activity lasting approximately 1 minute.  Post seizure had left-sided weakness and a code stroke was called.  Patient was seen by neurology and sent down for a CT scan of the head which was negative.  Also had a spot EEG.  Now plan for long-term video EEG.  Critical care was consulted for recommendations and management.  She did receive Ativan and was more somnolent after this event.  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  She had ongoing delirium during the week.  This week her ICU course has been complicated by a GIB with acute blood loss anemia requiring blood transfusions. She has been on pantoprazole infusion since 8/11. Planning for EGD today.  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 8/8 LTM EEG negative, very awake on nasal cannula this morning, neurology has signed off and DC'd MRI and Keppra 8/11 melanic bowel movements, 2 units pRBCs. 8/13 1 unit pRBCs, EGD> clips to ulcer and dieulafoy lesions  Interim History / Subjective:  Today she denies complaints. She had a few melanic Bms overnight but no BRB or melena today.  Objective   Blood  pressure (!) 180/74, pulse 73, temperature 98.3 F (36.8 C), temperature source Oral, resp. rate 12, height '5\' 2"'$  (1.575 m), weight 66 kg, SpO2 98 %.        Intake/Output Summary (Last 24 hours) at 09/23/2020 1340 Last data filed at 09/23/2020 1100 Gross per 24 hour  Intake 1160.77 ml  Output --  Net 1160.77 ml    Filed Weights   09/21/20 1500 09/22/20 0700 09/23/20 0500  Weight: 61.2 kg 62.5 kg 66 kg    Examination: General: middle aged woman lying in bed in NAD HENT: [place]/AT, eyes anicteric Lungs: breathing comfortably on RA, CTAB, no conversational dyspnea Cardiovascular: S1S2, RRR Abdomen: soft, NT, ND Extremities: LUE edema, fistula in LUE, no LE edema. Neuro: awake, alert, able to sit forward in bed with minimal assistance. Answering questions appropriately. Derm: warm, dry, no rashes  Resolved Hospital Problem list   delirium  Assessment & Plan:   NSTEMI s/p CABG CAD Mild aortic stenosis -post-op care per TCTS -tele monitoring -holding aspirin for a few days due to PUD and GIB -con't statin, metoprolol, Imdur -con't amiodarone  HTN -con't irbesartan, metoprolol, amlodipine, clonidine, Imdur -increase hydralazine to 100 mg TID  Acute UGIB due to PUD & dieulafoy lesions treated with clips -appreciate GI's assistance; advancing diet today -PPI Q12h -monitor H/H and stool output -not sure she still needs anusol suppositories -hopefully can resume lovenox tomorrow  Acute on chronic anemia Acute blood loss anemia -transfuse for Hb<7 or hemodynamically significant bleeding; no current signs of ongoing bleeding -  monitor  ESRD -renally dose meds, avoid nephrotoxic meds -HD per nephrology  Thrombocytopenia -monitor -no need for transfusion at this time  Allergic rhinosinusitis -flonase, cetirizine daily  Hyperglycemia post-op, controlled -no longer needing accuchecks or SSI  Chronic prednisone use for psoriatic arthritis -con't pred '5mg'$  daily  Best  Practice (right click and "Reselect all SmartList Selections" daily)   Diet/type: clear liquids- advancing per GI DVT prophylaxis: not indicated- on hold with bleeding GI prophylaxis: PPI Lines: Central line Foley:  N/A Code Status:  full code Last date of multidisciplinary goals of care discussion '[ ]'$   Labs   CBC: Recent Labs  Lab 09/20/20 0053 09/20/20 1537 09/21/20 0318 09/21/20 1441 09/22/20 0450 09/22/20 0639 09/22/20 1410 09/22/20 2135 09/23/20 0441  WBC 10.3  --  8.9  --  7.8 8.4  --   --  8.5  NEUTROABS  --   --   --   --   --  5.9  --   --   --   HGB 8.3*   < > 8.2*   < > 7.0* 7.5* 9.0* 8.7* 8.4*  HCT 23.8*   < > 25.0*   < > 21.2* 22.8* 27.2* 26.4* 26.1*  MCV 85.6  --  85.9  --  86.5 86.7  --   --  88.8  PLT 109*  --  84*  --  72* 78*  --   --  75*   < > = values in this interval not displayed.     Basic Metabolic Panel: Recent Labs  Lab 09/17/20 0655 09/17/20 1014 09/19/20 0641 09/20/20 0053 09/20/20 1537 09/21/20 0315 09/22/20 0639 09/23/20 0410  NA 131*   < > 134* 134*  --  135 136 131*  K 4.1   < > 3.8 4.0  --  4.5 3.8 4.6  CL 97*   < > 98 97*  --  99 101 97*  CO2 23   < > 26 27  --  '27 29 26  '$ GLUCOSE 106*   < > 100* 85  --  74 84 105*  BUN 31*   < > 27* 26*  --  61* 37* 42*  CREATININE 5.41*   < > 5.34* 4.10*  --  5.25* 3.97* 4.90*  CALCIUM 9.2   < > 9.8 9.2  --  8.3* 8.4* 8.9  MG 1.8  --   --   --   --   --   --   --   PHOS 3.2  3.3  --  1.9* 1.2* 2.7 2.6 2.0* 3.8   < > = values in this interval not displayed.    GFR: Estimated Creatinine Clearance: 10.9 mL/min (A) (by C-G formula based on SCr of 4.9 mg/dL (H)). Recent Labs  Lab 09/21/20 0318 09/22/20 0450 09/22/20 0639 09/23/20 0441  WBC 8.9 7.8 8.4 8.5       Julian Hy, DO 09/23/20 1:54 PM [facility]

## 2020-09-24 ENCOUNTER — Encounter (HOSPITAL_COMMUNITY): Payer: Medicare PPO

## 2020-09-24 DIAGNOSIS — N186 End stage renal disease: Secondary | ICD-10-CM | POA: Diagnosis not present

## 2020-09-24 DIAGNOSIS — I160 Hypertensive urgency: Secondary | ICD-10-CM | POA: Diagnosis not present

## 2020-09-24 LAB — CBC
HCT: 24.7 % — ABNORMAL LOW (ref 36.0–46.0)
Hemoglobin: 8.1 g/dL — ABNORMAL LOW (ref 12.0–15.0)
MCH: 29 pg (ref 26.0–34.0)
MCHC: 32.8 g/dL (ref 30.0–36.0)
MCV: 88.5 fL (ref 80.0–100.0)
Platelets: 72 10*3/uL — ABNORMAL LOW (ref 150–400)
RBC: 2.79 MIL/uL — ABNORMAL LOW (ref 3.87–5.11)
RDW: 21.9 % — ABNORMAL HIGH (ref 11.5–15.5)
WBC: 9.3 10*3/uL (ref 4.0–10.5)
nRBC: 0 % (ref 0.0–0.2)

## 2020-09-24 LAB — BASIC METABOLIC PANEL
Anion gap: 9 (ref 5–15)
BUN: 46 mg/dL — ABNORMAL HIGH (ref 6–20)
CO2: 26 mmol/L (ref 22–32)
Calcium: 9.2 mg/dL (ref 8.9–10.3)
Chloride: 96 mmol/L — ABNORMAL LOW (ref 98–111)
Creatinine, Ser: 5.85 mg/dL — ABNORMAL HIGH (ref 0.44–1.00)
GFR, Estimated: 8 mL/min — ABNORMAL LOW (ref >60)
Glucose, Bld: 100 mg/dL — ABNORMAL HIGH (ref 70–99)
Potassium: 4.4 mmol/L (ref 3.5–5.1)
Sodium: 131 mmol/L — ABNORMAL LOW (ref 135–145)

## 2020-09-24 LAB — MAGNESIUM: Magnesium: 1.7 mg/dL (ref 1.7–2.4)

## 2020-09-24 LAB — PHOSPHORUS: Phosphorus: 3.9 mg/dL (ref 2.5–4.6)

## 2020-09-24 MED ORDER — LABETALOL HCL 200 MG PO TABS
400.0000 mg | ORAL_TABLET | Freq: Three times a day (TID) | ORAL | Status: DC
  Administered 2020-09-24 – 2020-09-25 (×3): 400 mg via ORAL
  Filled 2020-09-24 (×3): qty 2

## 2020-09-24 MED ORDER — LABETALOL HCL 200 MG PO TABS: 400.0000 mg | ORAL_TABLET | Freq: Three times a day (TID) | ORAL | Status: DC

## 2020-09-24 MED ORDER — OXYCODONE HCL 5 MG PO TABS
ORAL_TABLET | ORAL | Status: AC
  Filled 2020-09-24: qty 2

## 2020-09-24 NOTE — Progress Notes (Signed)
EVENING ROUNDS NOTE :     [address]       [place] [address]             [phone]                 2 Days Post-Op Procedure(s) (LRB): ESOPHAGOGASTRODUODENOSCOPY (EGD) (N/A) HEMOSTASIS CONTROL HEMOSTASIS CLIP PLACEMENT   Total Length of Stay:  LOS: 16 days  Events:   Hypertensive H/H stable    BP (!) 198/80   Pulse 76   Temp 98.4 F (36.9 C) (Oral)   Resp 15   Ht '5\' 2"'$  (1.575 m)   Wt 65.2 kg   SpO2 98%   BMI 26.29 kg/m      FiO2 (%):  [30 %] 30 %   sodium chloride     sodium chloride      I/O last 3 completed shifts: In: 1392.7 [P.O.:1198.5; I.V.:194.2] Out: -    CBC Latest Ref Rng & Units 09/24/2020 09/23/2020 09/22/2020  WBC 4.0 - 10.5 K/uL 9.3 8.5 -  Hemoglobin 12.0 - 15.0 g/dL 8.1(L) 8.4(L) 8.7(L)  Hematocrit 36.0 - 46.0 % 24.7(L) 26.1(L) 26.4(L)  Platelets 150 - 400 K/uL 72(L) 75(L) -    BMP Latest Ref Rng & Units 09/24/2020 09/23/2020 09/22/2020  Glucose 70 - 99 mg/dL 100(H) 105(H) 84  BUN 6 - 20 mg/dL 46(H) 42(H) 37(H)  Creatinine 0.44 - 1.00 mg/dL 5.85(H) 4.90(H) 3.97(H)  Sodium 135 - 145 mmol/L 131(L) 131(L) 136  Potassium 3.5 - 5.1 mmol/L 4.4 4.6 3.8  Chloride 98 - 111 mmol/L 96(L) 97(L) 101  CO2 22 - 32 mmol/L '26 26 29  '$ Calcium 8.9 - 10.3 mg/dL 9.2 8.9 8.4(L)    ABG    Component Value Date/Time   PHART 7.439 09/17/2020 1014   PCO2ART 43.7 09/17/2020 1014   PO2ART 60 (L) 09/17/2020 1014   HCO3 29.6 (H) 09/17/2020 1014   TCO2 31 09/17/2020 1014   ACIDBASEDEF 1.0 09/13/2020 2152   O2SAT 91.0 09/17/2020 [place], MD 09/24/2020 6:34 PM

## 2020-09-24 NOTE — Progress Notes (Signed)
NAME:  GREGORIA VITO, MRN:  [id number], DOB:  1961-01-02, LOS: 24 ADMISSION DATE:  09/08/2020, CONSULTATION DATE:  8/13 REFERRING MD:  TCTS, CHIEF COMPLAINT:  post-CABG  History of Present Illness:  This is a 60 year old female, past medical history of ESRD on dialysis, CAD, COPD, type 2 diabetes who was admitted for an NSTEMI, now status post CABG.  Last night she was doing well until around 5 AM.  She started having spasming in her legs right arm flexion head turn to the right became unresponsive.  Felt to have seizure-like activity lasting approximately 1 minute.  Post seizure had left-sided weakness and a code stroke was called.  Patient was seen by neurology and sent down for a CT scan of the head which was negative.  Also had a spot EEG.  Now plan for long-term video EEG.  Critical care was consulted for recommendations and management.  She did receive Ativan and was more somnolent after this event.  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  She had ongoing delirium during the week.  This week her ICU course has been complicated by a GIB with acute blood loss anemia requiring blood transfusions. She has been on pantoprazole infusion since 8/11. Planning for EGD today.  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 8/8 LTM EEG negative, very awake on nasal cannula this morning, neurology has signed off and DC'd MRI and Keppra 8/11 melanic bowel movements, 2 units pRBCs. 8/13 1 unit pRBCs, EGD> clips to ulcer and dieulafoy lesions 8/15 pccm off   Interim History / Subjective:  No distress but feels like she will feel better after HD   Objective   Blood pressure (Abnormal)  169/102, pulse 70, temperature 98.2 F (36.8 C), temperature source Oral, resp. rate 15, height '5\' 2"'$  ([id number] m), weight 67.7 kg, SpO2 100 %.    FiO2 (%):  [30 %] 30 %   Intake/Output Summary (Last 24 hours) at 09/24/2020 0855 Last data filed at 09/24/2020 0000 Gross per 24 hour  Intake 1121.5 ml  Output no documentation  Net 1121.5 ml   Filed Weights   09/22/20 0700 09/23/20 0500 09/24/20 0500  Weight: 62.5 kg 66 kg 67.7 kg    Examination: General 59 year old female resting in bed. Currently getting her HD treatment HENT NCAT the left IJ dressing is intact Pulm a little labored. Dec bases now on room air Card holosystolic heart murmur  Abd soft  Ext warm and dry  Neuro intact Gu anuric   Resolved Hospital Problem list   delirium  Assessment & Plan:   NSTEMI s/p CABG CAD Mild aortic stenosis -per TCTS Cont amiodarone Asa to resume at dc  LUE swelling CT neg for clot -monitor   HTN -losartan,lopressor, norvasc, clonidine and imdur  Hydralazine increased   Acute on chronic anemia c/b Acute blood loss anemia from PUD & dieulafoy lesions treated with clips hgb stable, appreciate GI -transfuse for hgb > 7 -Trend cbc -advance diet -PPI q 12 -holding LMWH  ESRD -HD today   Thrombocytopenia Stable -monitor   Allergic rhinosinusitis -cont flonase and H2B  Chronic prednisone use for psoriatic arthritis -cont pred '5mg'$ /d   Best Practice (right click and "Reselect all  SmartList Selections" daily)   Per primary  PCCM is signing off  Erick Colace ACNP-BC [place] Pager # [phone] OR # [phone] if no answer

## 2020-09-24 NOTE — Progress Notes (Signed)
2 Days Post-Op Procedure(s) (LRB): ESOPHAGOGASTRODUODENOSCOPY (EGD) (N/A) HEMOSTASIS CONTROL HEMOSTASIS CLIP PLACEMENT Subjective:  Mild chest soreness. Taking soft diet. No further blood per rectum or melena.  Objective: Vital signs in last 24 hours: Temp:  [97.9 F (36.6 C)-98.5 F (36.9 C)] 98.2 F (36.8 C) (08/15 0400) Pulse Rate:  [69-79] 72 (08/15 0700) Cardiac Rhythm: Normal sinus rhythm (08/15 0000) Resp:  [10-24] 18 (08/15 0700) BP: (153-218)/(72-104) 198/96 (08/15 0700) SpO2:  [91 %-100 %] 100 % (08/15 0700) FiO2 (%):  [30 %] 30 % (08/14 2347) Weight:  [67.7 kg] 67.7 kg (08/15 0500)  Hemodynamic parameters for last 24 hours:    Intake/Output from previous day: 08/14 0701 - 08/15 0700 In: 1251.5 [P.O.:1198.5; I.V.:53] Out: -  Intake/Output this shift: No intake/output data recorded.  General appearance: alert and cooperative Neurologic: intact Heart: regular rate and rhythm Lungs: diminished breath sounds bibasilar Abdomen: soft, non-tender; bowel sounds normal Extremities: LUE remains more edematous than RUE and same for breasts. Mild lower extremity edema. Wound: chest incision healing well.  Lab Results: Recent Labs    09/23/20 0441 09/24/20 0340  WBC 8.5 9.3  HGB 8.4* 8.1*  HCT 26.1* 24.7*  PLT 75* 72*   BMET:  Recent Labs    09/23/20 0410 09/24/20 0340  NA 131* 131*  K 4.6 4.4  CL 97* 96*  CO2 26 26  GLUCOSE 105* 100*  BUN 42* 46*  CREATININE 4.90* 5.85*  CALCIUM 8.9 9.2    PT/INR: No results for input(s): LABPROT, INR in the last 72 hours. ABG    Component Value Date/Time   PHART 7.439 09/17/2020 1014   HCO3 29.6 (H) 09/17/2020 1014   TCO2 31 09/17/2020 1014   ACIDBASEDEF 1.0 09/13/2020 2152   O2SAT 91.0 09/17/2020 1014   CBG (last 3)  Recent Labs    09/22/20 2354 09/23/20 0429 09/23/20 0652  GLUCAP 105* 97 98    Assessment/Plan:  POD 2 S/P Procedure(s) (LRB): ESOPHAGOGASTRODUODENOSCOPY (EGD) (N/A) HEMOSTASIS  CONTROL HEMOSTASIS CLIP PLACEMENT  POD 11 CABG   Hypertensive. Medications being adjusted by nephrology.   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 No further melena. Hgb slightly down but likely due to RBC attrition and dilution. Continue to follow. Protonix per GI. Hold ECASA and Lovenox. Resume ASA at discharge.   HD per nephrology. Wt is 14 lbs over preop wt and she has significant edema, small pleural effusions. She needs volume removal with HD and BP should tolerate that.    Seems neurologically back to baseline.   Postop atrial fib: maintaining sinus on po amiodarone.  CTA chest did not show any sign of clot in venous system. Suspect the asymmetric edema in LUE and left breast is probably due to LUE AV fistula with venous hypertension, volume expansion and should improve with removing volume.   IS, OOB, Ambulation.  If she is stable after HD she could go to 4E.   LOS: 16 days    Gaye Pollack 09/24/2020

## 2020-09-24 NOTE — Progress Notes (Signed)
Patient ID: Jodi Bell, female   DOB: 24-Jan-1961, 60 y.o.   MRN: [id number]  [facility] Progress Note   Assessment/ Plan:   1.  Non-ST elevation MI status post two-vessel CABG yesterday: Presented with volume overload/acute hypoxic respiratory failure and underwent two-vessel CABG on 09/13/2020.   2. ESRD: On hemodialysis on a Monday/Wednesday/Friday schedule as an outpatient and with plans to undergo hemodialysis today and challenge estimated dry weight. 3.  Acute blood loss anemia: Hemoglobin/hematocrit continue to trend down, EGD showing dieulafoy lesions with an ulcer at the gastroesophageal junction with visible vessel treated with hemostatic clip.  High risk for diffuse oozing based on EGD finding. 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 5.  Hypertension: Blood pressure remains elevated, advised nurse to go ahead and give her a.m. dose of as needed labetalol as this is unlikely to affect delivery of dialysis/UF. 6.  Left arm/breast swelling: Possibly related to venous congestion with left CVC-line has been discontinued and we will attempt ultrafiltration with hemodialysis.  No evidence of DVT.  Subjective:   Reports some mild chest tightness/shortness of breath this morning.   Objective:   BP (!) 204/104   Pulse 69   Temp 98.2 F (36.8 C) (Oral)   Resp 11   Ht '5\' 2"'$  (1.575 m)   Wt 67.7 kg   SpO2 98%   BMI 27.30 kg/m   Physical Exam: Gen: Resting comfortably, nurse at bedside applying pressure to left IJ CVC removal site CVS: Pulse regular rhythm, distant heart sounds with well-healed sternotomy scar Resp: Clear to auscultation bilaterally, no rales/rhonchi.  Asymmetric left-sided breast swelling Abd: Soft, obese, nontender, bowel sounds normal Ext: 2-3+ left upper extremity edema with pulsatile left upper arm fistula.  Labs: BMET Recent Labs  Lab 09/18/20 0312  09/19/20 0641 09/20/20 0053 09/20/20 1537 09/21/20 0315 09/22/20 0639 09/23/20 0410 09/24/20 0340  NA 133* 134* 134*  --  135 136 131* 131*  K 3.4* 3.8 4.0  --  4.5 3.8 4.6 4.4  CL 96* 98 97*  --  99 101 97* 96*  CO2 '29 26 27  '$ --  '27 29 26 26  '$ GLUCOSE 106* 100* 85  --  74 84 105* 100*  BUN 19 27* 26*  --  61* 37* 42* 46*  CREATININE 4.01* 5.34* 4.10*  --  5.25* 3.97* 4.90* 5.85*  CALCIUM 9.2 9.8 9.2  --  8.3* 8.4* 8.9 9.2  PHOS  --  1.9* 1.2* 2.7 2.6 2.0* 3.8 3.9   CBC Recent Labs  Lab 09/22/20 0450 09/22/20 0639 09/22/20 1410 09/22/20 2135 09/23/20 0441 09/24/20 0340  WBC 7.8 8.4  --   --  8.5 9.3  NEUTROABS  --  5.9  --   --   --   --   HGB 7.0* 7.5* 9.0* 8.7* 8.4* 8.1*  HCT 21.2* 22.8* 27.2* 26.4* 26.1* 24.7*  MCV 86.5 86.7  --   --  88.8 88.5  PLT 72* 78*  --   --  75* 72*      Medications:     sodium chloride   Intravenous Once   allopurinol  100 mg Oral QPM   amiodarone  200 mg Oral BID   amLODipine  10 mg Oral Daily   chlorhexidine  15 mL Mouth Rinse BID   Chlorhexidine Gluconate Cloth  6 each Topical Daily   Chlorhexidine Gluconate Cloth  6 each Topical Q0600  Chlorhexidine Gluconate Cloth  6 each Topical Q0600   cholecalciferol  1,000 Units Oral QPM   cinacalcet  30 mg Oral Q M,W,F-HD   cloNIDine  0.1 mg Oral BID   darbepoetin (ARANESP) injection - DIALYSIS  100 mcg Intravenous Q Wed-HD   docusate sodium  200 mg Oral Daily   feeding supplement  237 mL Oral TID BM   ferrous sulfate  325 mg Oral Once per day on Mon Wed Fri   fluticasone  1 spray Each Nare Daily   folic acid  1 mg Oral Daily   Gerhardt's butt cream   Topical BID   hydrALAZINE  100 mg Oral Q8H   hydrocortisone  25 mg Rectal QHS   irbesartan  300 mg Oral Daily   isosorbide mononitrate  30 mg Oral Daily   magnesium oxide  200 mg Oral Daily   mouth rinse  15 mL Mouth Rinse q12n4p   metoprolol tartrate  50 mg Oral BID   montelukast  10 mg Oral Q supper   multivitamin  1 tablet Oral  QHS   nystatin  5 mL Oral QID   pantoprazole (PROTONIX) IV  40 mg Intravenous Q12H   polyethylene glycol  17 g Oral Daily   polyvinyl alcohol  1 drop Both Eyes TID AC & HS   predniSONE  5 mg Oral Q breakfast   saccharomyces boulardii  250 mg Oral Daily   sodium chloride flush  10-40 mL Intracatheter Q12H   sodium chloride flush  3 mL Intravenous Q12H   umeclidinium bromide  1 puff Inhalation Daily   Elmarie Shiley, MD 09/24/2020, 6:43 AM

## 2020-09-24 NOTE — Progress Notes (Signed)
Patient stated she didn't need to wear BiPAP at this time. RT explained that if her respiratory status changes that the BiPAP will be applied.

## 2020-09-24 NOTE — Progress Notes (Signed)
Jodi Bell 4:13 PM  Subjective: Patient doing well this afternoon without any signs of bleeding or any GI complaints and she did have a little pain after Endo but no other complaints and her hospital computer chart was reviewed.  Case discussed with Dr. Paulita Fujita and they are advancing her diet previous work-up reviewed including capsule endoscopy last year  Objective: Vital signs stable afebrile no acute distress abdomen is soft nontender hemoglobin 8.1 with slight increase BUN and creatinine before dialysis  Assessment: GI blood loss status post therapy on stomach  Plan: Will follow at a distance please call if signs of active bleeding and consider repeat endoscopy versus capsule endoscopy if signs of bleeding continue and would continue pump inhibitors and switch to p.o. when able [facility]  office [phone] After 5PM or if no answer call [phone]

## 2020-09-24 NOTE — Progress Notes (Signed)
SLP Cancellation Note  Patient Details Name: LAURIA COCKMAN MRN: [id number] DOB: [birth year]   Cancelled treatment:       Reason Eval/Treat Not Completed: Patient at procedure or test/unavailable (HD). Will f/u as able.    Osie Bond., M.A. [place] [facility] Pager [phone] Office [phone]  09/24/2020, 10:00 AM

## 2020-09-24 NOTE — TOC Progression Note (Addendum)
Transition of Care [facility]) - Progression Note    Patient Details  Name: Jodi Bell MRN: [id number] Date of Birth: 1960/04/26  Transition of Care [facility] CM/SW [place] Phone Number: 09/24/2020, 4:19 PM  Clinical Narrative:     UpdateJuliann Pulse with [facility] called CSW back and confirmed SNF bed offer. CSW will follow back up with [name] from [facility] closer to patient being medically ready for bed availability.  CSW spoke with patient at bedside and provided SNF bed offer. Patient chose SNF placement at [facility]. CSW awaiting callback from [place] with [facility] to confirm SNF bed. CSW will need to start insurance authorization close to patient being medically ready for dc. CSW will continue to follow and assist with dc planning needs.  Expected Discharge Plan: [place] Barriers to Discharge: Continued Medical Work up  Expected Discharge Plan and Services Expected Discharge Plan: [place] In-house Referral: Clinical Social Work     Living arrangements for the past 2 months: Single Family Home                                       Social Determinants of Health (SDOH) Interventions    Readmission Risk Interventions No flowsheet data found.

## 2020-09-24 NOTE — Procedures (Signed)
Patient seen on Hemodialysis. BP (!) 176/90 (BP Location: Right Arm)   Pulse 71   Temp 98.3 F (36.8 C) (Oral)   Resp 11   Ht '5\' 2"'$  (1.575 m)   Wt 67.7 kg   SpO2 100%   BMI 27.30 kg/m   QB 400, UF goal 3L Tolerating treatment without complaints at this time.   Elmarie Shiley MD [facility]. Office # [phone] Pager # [phone] 11:34 AM

## 2020-09-24 NOTE — Progress Notes (Signed)
PT Cancellation Note  Patient Details Name: Jodi Bell MRN: [id number] DOB: March 13, 1960   Cancelled Treatment:    Reason Eval/Treat Not Completed: Patient declined, no reason specified.  Did want to get up after back from HD ~ 4-5 hour later. 09/24/2020  Ginger Carne., PT Acute Rehabilitation Services [phone]  (pager) [phone]  (office)   [name] 09/24/2020, 6:15 PM

## 2020-09-25 ENCOUNTER — Encounter (HOSPITAL_COMMUNITY): Payer: Self-pay | Admitting: Gastroenterology

## 2020-09-25 MED ORDER — LABETALOL HCL 200 MG PO TABS
200.0000 mg | ORAL_TABLET | Freq: Three times a day (TID) | ORAL | Status: DC
  Administered 2020-09-25 – 2020-10-03 (×12): 200 mg via ORAL
  Filled 2020-09-25 (×23): qty 1

## 2020-09-25 MED ORDER — SODIUM CHLORIDE 0.9% FLUSH: 3.0000 mL | INTRAVENOUS | Status: DC | PRN

## 2020-09-25 MED ORDER — SODIUM CHLORIDE 0.9 % IV SOLN: 250.0000 mL | INTRAVENOUS | Status: DC | PRN

## 2020-09-25 MED ORDER — SODIUM CHLORIDE 0.9% FLUSH
3.0000 mL | Freq: Two times a day (BID) | INTRAVENOUS | Status: DC
  Administered 2020-09-25 – 2020-10-03 (×12): 3 mL via INTRAVENOUS

## 2020-09-25 MED ORDER — ACETAMINOPHEN 325 MG PO TABS
650.0000 mg | ORAL_TABLET | ORAL | Status: DC | PRN
  Administered 2020-09-25 – 2020-10-03 (×8): 650 mg via ORAL
  Filled 2020-09-25 (×9): qty 2

## 2020-09-25 MED ORDER — ~~LOC~~ CARDIAC SURGERY, PATIENT & FAMILY EDUCATION: Freq: Once | Status: AC

## 2020-09-25 MED ORDER — DOCUSATE SODIUM 100 MG PO CAPS
100.0000 mg | ORAL_CAPSULE | Freq: Two times a day (BID) | ORAL | Status: DC
  Administered 2020-09-25 – 2020-10-04 (×14): 100 mg via ORAL
  Filled 2020-09-25 (×15): qty 1

## 2020-09-25 NOTE — Progress Notes (Signed)
Out Patient Arrangements:  Pt is established @ [facility] [phone]) on their MWF shift w/ an in the door time of 6:00 am.  Linus Orn [facility] [phone]

## 2020-09-25 NOTE — Progress Notes (Signed)
Physical Therapy Treatment Patient Details Name: Jodi Bell MRN: [id number] DOB: 1960-09-29 Today's Date: 09/25/2020    History of Present Illness Pt is a 60 y.o. female admitted 09/08/20 with acute onset SOB. Workup for CHF decompensaiton, COPD exacerbation, HTN urgency, NSTEMI; need for emergent HD. S/p LHC 8/1 which showed severe two-vessel CAD. CABGx2 8/4. GI bleeding scan negative 08/11. PMH includes ESRD (HD MWF), HTN, DM2, COPD, OSA, psoriatic RA, PAD, anemia, tobacco abuse.    PT Comments    Continuing work on functional mobility and activity tolerance;  Notable improvement in activity tolerance, and amb distance compared to last session;  took two satnding rest breaks during amb (propped her elbows on RW); Likely fatigued after working with OT at the sink; Needs reminders for sternal precautions  Follow Up Recommendations  SNF;Supervision/Assistance - 24 hour; If her progress continues, may consider changing rec back to Home; will monitor     Equipment Recommendations  3in1 (PT)    Recommendations for Other Services       Precautions / Restrictions Precautions Precautions: Fall;Sternal Precaution Booklet Issued: Yes (comment) Precaution Comments: reviewed sternal precautions pt with no carryover or awareness Restrictions Weight Bearing Restrictions: Yes Other Position/Activity Restrictions: sternal precautions    Mobility  Bed Mobility Overal bed mobility: Needs Assistance Bed Mobility: Sit to Supine     Supine to sit: Min guard;HOB elevated Sit to supine: Min guard   General bed mobility comments: increased time, min guard with cueing for sternal precautions    Transfers Overall transfer level: Needs assistance Equipment used: Rolling walker (2 wheeled) Transfers: Sit to/from Stand Sit to Stand: Min assist;Mod assist         General transfer comment: cueing for hand placement and safety; min assist form high ICU bed; light mod assist to power up from  lower chair  Ambulation/Gait Ambulation/Gait assistance: Min assist;Min guard Gait Distance (Feet): 100 Feet Assistive device: Rolling walker (2 wheeled) Gait Pattern/deviations: Shuffle;Trunk flexed Gait velocity: Decreased   General Gait Details: Walked hallways with cues to sel-monitor for activity tolerance; 2 standing rest breaks with pt propping elbows on RW; quite fatigued at end of amb   Stairs             Wheelchair Mobility    Modified Rankin (Stroke Patients Only)       Balance Overall balance assessment: Needs assistance Sitting-balance support: Feet supported;Single extremity supported Sitting balance-Leahy Scale: Fair Sitting balance - Comments: min guard for safety, cueing to adhere to precautions   Standing balance support: Bilateral upper extremity supported;During functional activity Standing balance-Leahy Scale: Poor Standing balance comment: relies on BUE support dynamically, min guard without UE support during ADLs                            Cognition Arousal/Alertness: Awake/alert Behavior During Therapy: Flat affect Overall Cognitive Status: Impaired/Different from baseline Area of Impairment: Memory;Awareness;Problem solving;Following commands;Safety/judgement                     Memory: Decreased short-term memory;Decreased recall of precautions Following Commands: Follows one step commands consistently;Follows one step commands with increased time;Follows multi-step commands inconsistently Safety/Judgement: Decreased awareness of safety;Decreased awareness of deficits Awareness: Emergent Problem Solving: Slow processing;Requires verbal cues;Difficulty sequencing General Comments: pt with decreased recall of precautions and requires cueing to adhere functionally, min cueing for safety and awareness      Exercises      General Comments  General comments (skin integrity, edema, etc.): VSS on RA      Pertinent  Vitals/Pain Pain Assessment: Faces Faces Pain Scale: Hurts little more Pain Location: generalized, chest Pain Descriptors / Indicators: Discomfort Pain Intervention(s): Monitored during session    Home Living                      Prior Function            PT Goals (current goals can now be found in the care plan section) Acute Rehab PT Goals Patient Stated Goal: return home with assist from family Time For Goal Achievement: 10/04/20 Potential to Achieve Goals: Fair Progress towards PT goals: Progressing toward goals    Frequency    Min 3X/week      PT Plan Current plan remains appropriate;Other (comment) (will consider decreasing frequency)    Co-evaluation              AM-PAC PT "6 Clicks" Mobility   Outcome Measure  Help needed turning from your back to your side while in a flat bed without using bedrails?: A Little Help needed moving from lying on your back to sitting on the side of a flat bed without using bedrails?: A Lot Help needed moving to and from a bed to a chair (including a wheelchair)?: A Little Help needed standing up from a chair using your arms (e.g., wheelchair or bedside chair)?: A Lot Help needed to walk in hospital room?: A Lot Help needed climbing 3-5 steps with a railing? : Total 6 Click Score: 13    End of Session Equipment Utilized During Treatment: Gait belt   Patient left: in bed;with call bell/phone within reach Nurse Communication: Mobility status PT Visit Diagnosis: Other abnormalities of gait and mobility (R26.89);Muscle weakness (generalized) (M62.81);Difficulty in walking, not elsewhere classified (R26.2)     Time: [id number] PT Time Calculation (min) (ACUTE ONLY): 20 min  Charges:  $Gait Training: 8-22 mins                     Roney [place], [place]  Acute Rehabilitation Services Pager [phone] Office [place] 09/25/2020, 2:36 PM

## 2020-09-25 NOTE — TOC Progression Note (Signed)
Transition of Care [facility]) - Progression Note    Patient Details  Name: Jodi Bell MRN: [id number] Date of Birth: 1960/03/10  Transition of Care [facility]) CM/SW [place], [place] Phone Number: 09/25/2020, 4:34 PM  Clinical Narrative:     Patient has SNF bed at [facility]. CSW will need to start insurance authorization close to patient being medically ready. CSW received HD information from renal navigator Marya Amsler for patient to give to SNF. CSW will continue to follow and assist with dc planning needs.  Expected Discharge Plan: [place] Barriers to Discharge: Continued Medical Work up  Expected Discharge Plan and Services Expected Discharge Plan: [place] In-house Referral: Clinical Social Work     Living arrangements for the past 2 months: Single Family Home                                       Social Determinants of Health (SDOH) Interventions    Readmission Risk Interventions No flowsheet data found.

## 2020-09-25 NOTE — Progress Notes (Signed)
Pt received from [place]. VSS. Pt oriented to room and unit, call light in reach.  Clyde Canterbury, RN

## 2020-09-25 NOTE — Progress Notes (Signed)
3 Days Post-Op Procedure(s) (LRB): ESOPHAGOGASTRODUODENOSCOPY (EGD) (N/A) HEMOSTASIS CONTROL HEMOSTASIS CLIP PLACEMENT Subjective: Chest wall soreness. Eating, ambulating. No further melena or BRBPR.  Objective: Vital signs in last 24 hours: Temp:  [97.7 F (36.5 C)-98.4 F (36.9 C)] 97.9 F (36.6 C) (08/16 0400) Pulse Rate:  [63-90] 69 (08/16 0600) Cardiac Rhythm: Heart block (08/16 0000) Resp:  [10-21] 16 (08/16 0600) BP: (98-198)/(56-102) 123/71 (08/16 0600) SpO2:  [95 %-100 %] 99 % (08/16 0600) Weight:  [65.2 kg-67.7 kg] 65.2 kg (08/15 1240)  Hemodynamic parameters for last 24 hours:    Intake/Output from previous day: 08/15 0701 - 08/16 0700 In: 527 [P.O.:527] Out: 2500  Intake/Output this shift: Total I/O In: 190 [P.O.:190] Out: -   General appearance: alert and cooperative Neurologic: intact Heart: regular rate and rhythm Lungs: diminished breath sounds bibasilar Abdomen: soft, non-tender; bowel sounds normal; no masses,  no organomegaly Extremities: edema mild Wound: incision healing well Still has some asymmetric edema in LUE and left breast  Lab Results: Recent Labs    09/23/20 0441 09/24/20 0340  WBC 8.5 9.3  HGB 8.4* 8.1*  HCT 26.1* 24.7*  PLT 75* 72*   BMET:  Recent Labs    09/23/20 0410 09/24/20 0340  NA 131* 131*  K 4.6 4.4  CL 97* 96*  CO2 26 26  GLUCOSE 105* 100*  BUN 42* 46*  CREATININE 4.90* 5.85*  CALCIUM 8.9 9.2    PT/INR: No results for input(s): LABPROT, INR in the last 72 hours. ABG    Component Value Date/Time   PHART 7.439 09/17/2020 1014   HCO3 29.6 (H) 09/17/2020 1014   TCO2 31 09/17/2020 1014   ACIDBASEDEF 1.0 09/13/2020 2152   O2SAT 91.0 09/17/2020 1014   CBG (last 3)  Recent Labs    09/22/20 2354 09/23/20 0429 09/23/20 0652  GLUCAP 105* 97 98    Assessment/Plan:  POD 12 CABG    Hypertension much improved on oral labetalol.   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 No further melena. Hgb slightly down but likely due to RBC attrition and dilution. Continue to follow. Protonix per GI. Hold ECASA and Lovenox. Resume ASA at discharge.   HD per nephrology. -2500 cc yesterday with HD Wt is 11 lbs over preop wt and she has significant edema, small pleural effusions. She needs more volume removal with HD as tolerated.   Neurologically back to baseline.   Postop atrial fib: maintaining sinus on po amiodarone.   CTA chest did not show any sign of clot in venous system. Suspect the asymmetric edema in LUE and left breast is probably due to LUE AV fistula with venous hypertension, volume expansion and should improve with removing volume.   IS, OOB, Ambulation.   Transfer to 4E.   LOS: 17 days    Gaye Pollack 09/25/2020

## 2020-09-25 NOTE — Progress Notes (Signed)
Patient ID: Jodi Bell, female   DOB: 10/15/1960, 60 y.o.   MRN: [id number]  [facility] Progress Note   Assessment/ Plan:   1.  Non-ST elevation MI status post two-vessel CABG yesterday: Presented with volume overload/acute hypoxic respiratory failure and underwent two-vessel CABG on 09/13/2020.   2. ESRD: Usually MWF schedule and will order for hemodialysis again tomorrow with efforts to challenge dry weight given evident volume overload on exam. 3.  Acute blood loss anemia: Hemoglobin/hematocrit continue to trend down, EGD showing dieulafoy lesions with an ulcer at the gastroesophageal junction with visible vessel treated with hemostatic clip.  High risk for diffuse oozing based on EGD finding. 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 5.  Hypertension: Blood pressure improving with restarting labetalol and ongoing HD/UF. 6.  Left arm/breast swelling: No evidence of DVT and at some point if recurrent, may need fistulogram/central venogram to assess for central stenosis.  Subjective:   Tolerated hemodialysis yesterday with improvement of her shortness of breath/chest tightness that she had earlier.  Able to ambulate around the unit without problems.   Objective:   BP 123/71   Pulse 69   Temp 97.9 F (36.6 C) (Oral)   Resp 16   Ht '5\' 2"'$  (1.575 m)   Wt 66.4 kg   SpO2 99%   BMI 26.77 kg/m   Physical Exam: Gen: Sitting in recliner-just returned from ambulating CVS: Pulse regular rhythm, distant heart sounds with well-healed sternotomy scar Resp: Clear to auscultation bilaterally, no rales/rhonchi.  Asymmetric left-sided breast swelling Abd: Soft, obese, nontender, bowel sounds normal Ext: 2+ left upper extremity edema/breast swelling with pulsatile left upper arm fistula.  Labs: BMET Recent Labs  Lab 09/19/20 0641 09/20/20 0053 09/20/20 1537 09/21/20 0315 09/22/20 0639  09/23/20 0410 09/24/20 0340  NA 134* 134*  --  135 136 131* 131*  K 3.8 4.0  --  4.5 3.8 4.6 4.4  CL 98 97*  --  99 101 97* 96*  CO2 26 27  --  '27 29 26 26  '$ GLUCOSE 100* 85  --  74 84 105* 100*  BUN 27* 26*  --  61* 37* 42* 46*  CREATININE 5.34* 4.10*  --  5.25* 3.97* 4.90* 5.85*  CALCIUM 9.8 9.2  --  8.3* 8.4* 8.9 9.2  PHOS 1.9* 1.2* 2.7 2.6 2.0* 3.8 3.9   CBC Recent Labs  Lab 09/22/20 0450 09/22/20 0639 09/22/20 1410 09/22/20 2135 09/23/20 0441 09/24/20 0340  WBC 7.8 8.4  --   --  8.5 9.3  NEUTROABS  --  5.9  --   --   --   --   HGB 7.0* 7.5* 9.0* 8.7* 8.4* 8.1*  HCT 21.2* 22.8* 27.2* 26.4* 26.1* 24.7*  MCV 86.5 86.7  --   --  88.8 88.5  PLT 72* 78*  --   --  75* 72*      Medications:     sodium chloride   Intravenous Once   allopurinol  100 mg Oral QPM   amiodarone  200 mg Oral BID   amLODipine  10 mg Oral Daily   chlorhexidine  15 mL Mouth Rinse BID   Chlorhexidine Gluconate Cloth  6 each Topical Q0600   cholecalciferol  1,000 Units Oral QPM   cinacalcet  30 mg Oral Q M,W,F-HD   cloNIDine  0.1 mg Oral BID   darbepoetin (ARANESP) injection - DIALYSIS  100 mcg Intravenous Q Wed-HD  docusate sodium  200 mg Oral Daily   feeding supplement  237 mL Oral TID BM   ferrous sulfate  325 mg Oral Once per day on Mon Wed Fri   fluticasone  1 spray Each Nare Daily   folic acid  1 mg Oral Daily   Gerhardt's butt cream   Topical BID   hydrALAZINE  100 mg Oral Q8H   hydrocortisone  25 mg Rectal QHS   irbesartan  300 mg Oral Daily   isosorbide mononitrate  30 mg Oral Daily   labetalol  400 mg Oral TID   magnesium oxide  200 mg Oral Daily   mouth rinse  15 mL Mouth Rinse q12n4p   montelukast  10 mg Oral Q supper   multivitamin  1 tablet Oral QHS   pantoprazole (PROTONIX) IV  40 mg Intravenous Q12H   polyethylene glycol  17 g Oral Daily   polyvinyl alcohol  1 drop Both Eyes TID AC & HS   predniSONE  5 mg Oral Q breakfast   saccharomyces boulardii  250 mg Oral Daily    sodium chloride flush  10-40 mL Intracatheter Q12H   sodium chloride flush  3 mL Intravenous Q12H   umeclidinium bromide  1 puff Inhalation Daily   Elmarie Shiley, MD 09/25/2020, 7:05 AM

## 2020-09-25 NOTE — Plan of Care (Signed)
  Problem: Education: Goal: Will demonstrate proper wound care and an understanding of methods to prevent future damage Outcome: Progressing   Problem: Activity: Goal: Risk for activity intolerance will decrease Outcome: Progressing   Problem: Cardiac: Goal: Will achieve and/or maintain hemodynamic stability Outcome: Progressing   Problem: Respiratory: Goal: Respiratory status will improve Outcome: Progressing

## 2020-09-25 NOTE — Progress Notes (Signed)
Pt not in need of BIPAP at this time.

## 2020-09-25 NOTE — Progress Notes (Signed)
Occupational Therapy Treatment Patient Details Name: Jodi Bell MRN: [id number] DOB: 01/20/1961 Today's Date: 09/25/2020    History of present illness Pt is a 60 y.o. female admitted 09/08/20 with acute onset SOB. Workup for CHF decompensaiton, COPD exacerbation, HTN urgency, NSTEMI; need for emergent HD. S/p LHC 8/1 which showed severe two-vessel CAD. CABGx2 8/4. GI bleeding scan negative 08/11. PMH includes ESRD (HD MWF), HTN, DM2, COPD, OSA, psoriatic RA, PAD, anemia, tobacco abuse.   OT comments  Patient supine in bed and agreeable to OT. Reviewed sternal precautions and requires min cueing to adhere to functionally. Completing bathing at sink with min assist, LB dressing with min assist.  Transfers and in room mobility using RW with min assist. Limited by decreased activity tolerance and fatigue.  Progressing well, pending tolerance and 24/7 support maybe able to dc home.  Will follow.    Follow Up Recommendations  SNF;Supervision/Assistance - 24 hour (may progress to [facility])    Equipment Recommendations  3 in 1 bedside commode    Recommendations for Other Services      Precautions / Restrictions Precautions Precautions: Fall;Sternal Precaution Booklet Issued: Yes (comment) Precaution Comments: reviewed sternal precautions pt with no carryover or awareness Restrictions Weight Bearing Restrictions: Yes Other Position/Activity Restrictions: sternal precautions       Mobility Bed Mobility Overal bed mobility: Needs Assistance Bed Mobility: Supine to Sit     Supine to sit: Min guard;HOB elevated     General bed mobility comments: increased time, min guard with cueing for sternal precautions    Transfers Overall transfer level: Needs assistance Equipment used: Rolling walker (2 wheeled) Transfers: Sit to/from Stand Sit to Stand: Min assist         General transfer comment: cueing for hand placement and safety    Balance Overall balance assessment: Needs  assistance Sitting-balance support: Feet supported;Single extremity supported Sitting balance-Leahy Scale: Fair Sitting balance - Comments: min guard for safety, cueing to adhere to precautions   Standing balance support: Bilateral upper extremity supported;During functional activity Standing balance-Leahy Scale: Poor Standing balance comment: relies on BUE support dynamically, min guard without UE support during ADLs                           ADL either performed or assessed with clinical judgement   ADL Overall ADL's : Needs assistance/impaired     Grooming: Set up;Sitting   Upper Body Bathing: Set up;Sitting   Lower Body Bathing: Minimal assistance;Sit to/from stand       Lower Body Dressing: Minimal assistance;Sit to/from stand Lower Body Dressing Details (indicate cue type and reason): able to adjust socks using figure 4 technique, min assist sit to stand Toilet Transfer: Minimal assistance;RW;Ambulation           Functional mobility during ADLs: Min guard;Rolling walker General ADL Comments: pt limited by weakness, decreased activity tolerance     Vision       Perception     Praxis      Cognition Arousal/Alertness: Awake/alert Behavior During Therapy: Flat affect Overall Cognitive Status: Impaired/Different from baseline Area of Impairment: Memory;Awareness;Problem solving;Following commands;Safety/judgement                     Memory: Decreased short-term memory;Decreased recall of precautions Following Commands: Follows one step commands consistently;Follows one step commands with increased time;Follows multi-step commands inconsistently Safety/Judgement: Decreased awareness of safety;Decreased awareness of deficits Awareness: Emergent Problem Solving: Slow processing;Requires verbal cues;Difficulty  sequencing General Comments: pt with decreased recall of precautions and requires cueing to adhere functionally, min cueing for safety and  awareness        Exercises     Shoulder Instructions       General Comments VSS on RA    Pertinent Vitals/ Pain       Pain Assessment: Faces Faces Pain Scale: Hurts little more Pain Location: generalized, chest Pain Descriptors / Indicators: Discomfort Pain Intervention(s): Monitored during session;Repositioned;Premedicated before session;Limited activity within patient's tolerance  Home Living                                          Prior Functioning/Environment              Frequency  Min 2X/week        Progress Toward Goals  OT Goals(current goals can now be found in the care plan section)  Progress towards OT goals: Progressing toward goals  Acute Rehab OT Goals Patient Stated Goal: return home with assist from family  Plan Discharge plan remains appropriate;Frequency remains appropriate    Co-evaluation                 AM-PAC OT "6 Clicks" Daily Activity     Outcome Measure   Help from another person eating meals?: A Little Help from another person taking care of personal grooming?: A Little Help from another person toileting, which includes using toliet, bedpan, or urinal?: A Lot Help from another person bathing (including washing, rinsing, drying)?: A Little Help from another person to put on and taking off regular upper body clothing?: A Little Help from another person to put on and taking off regular lower body clothing?: A Little 6 Click Score: 17    End of Session Equipment Utilized During Treatment: Rolling walker  OT Visit Diagnosis: Unsteadiness on feet (R26.81);Other abnormalities of gait and mobility (R26.89);Muscle weakness (generalized) (M62.81);Other symptoms and signs involving cognitive function;Pain   Activity Tolerance Patient tolerated treatment well   Patient Left in chair;Other (comment) (with PT)   Nurse Communication Mobility status        Time: [id number] OT Time Calculation (min): 21  min  Charges: OT General Charges $OT Visit: 1 Visit OT Treatments $Self Care/Home Management : 8-22 mins  Jolaine Artist, OT Acute Rehabilitation Services Pager [phone] Office [phone]    Delight Stare 09/25/2020, 1:35 PM

## 2020-09-25 NOTE — Progress Notes (Signed)
SLP Note:  SLP was following for swallowing, but note that diet has been advanced and order for SLP was d/c by MD. Discussed with RN. If there are any additional concerns for swallowing, SLP can re-evaluate but we will need new orders to return.     Osie Bond., M.A. [place] Acute Environmental education officer [phone] Office [phone]

## 2020-09-26 LAB — BASIC METABOLIC PANEL
Anion gap: 7 (ref 5–15)
BUN: 24 mg/dL — ABNORMAL HIGH (ref 6–20)
CO2: 28 mmol/L (ref 22–32)
Calcium: 8.5 mg/dL — ABNORMAL LOW (ref 8.9–10.3)
Chloride: 96 mmol/L — ABNORMAL LOW (ref 98–111)
Creatinine, Ser: 4.73 mg/dL — ABNORMAL HIGH (ref 0.44–1.00)
GFR, Estimated: 10 mL/min — ABNORMAL LOW (ref >60)
Glucose, Bld: 129 mg/dL — ABNORMAL HIGH (ref 70–99)
Potassium: 4.2 mmol/L (ref 3.5–5.1)
Sodium: 131 mmol/L — ABNORMAL LOW (ref 135–145)

## 2020-09-26 LAB — CBC
HCT: 22.9 % — ABNORMAL LOW (ref 36.0–46.0)
Hemoglobin: 7.5 g/dL — ABNORMAL LOW (ref 12.0–15.0)
MCH: 29.2 pg (ref 26.0–34.0)
MCHC: 32.8 g/dL (ref 30.0–36.0)
MCV: 89.1 fL (ref 80.0–100.0)
Platelets: 85 10*3/uL — ABNORMAL LOW (ref 150–400)
RBC: 2.57 MIL/uL — ABNORMAL LOW (ref 3.87–5.11)
RDW: 21.5 % — ABNORMAL HIGH (ref 11.5–15.5)
WBC: 7.6 10*3/uL (ref 4.0–10.5)
nRBC: 0 % (ref 0.0–0.2)

## 2020-09-26 MED ORDER — OXYCODONE HCL 5 MG PO TABS
ORAL_TABLET | ORAL | Status: AC
  Filled 2020-09-26: qty 1

## 2020-09-26 MED ORDER — DARBEPOETIN ALFA 100 MCG/0.5ML IJ SOSY
PREFILLED_SYRINGE | INTRAMUSCULAR | Status: AC
  Filled 2020-09-26: qty 0.5

## 2020-09-26 NOTE — Progress Notes (Signed)
Pt ambulated in hallway with walker 119f. Tolerated well. Returned to bed with call light in reach.  HClyde Canterbury RN

## 2020-09-26 NOTE — Care Management Important Message (Signed)
Important Message  Patient Details  Name: Jodi Bell MRN: [id number] Date of Birth: 12/12/1960   Medicare Important Message Given:  Yes     Shelda Altes 09/26/2020, 10:57 AM

## 2020-09-26 NOTE — Progress Notes (Signed)
Jodi Bell 4:51 PM  Subjective: Patient doing well without any GI complaints and specifically no signs of bleeding and has not had a bowel movement in a few days and we discussed MiraLAX use and answered all of her and her family's questions  Objective: Vital signs stable afebrile no acute distress abdomen is soft nontender hemoglobin slight drop to 7.5  Assessment: Seemingly resolved GI blood loss  Plan: Please let us know if we can help during his hospital stay and consider capsule endoscopy or repeat endoscopy if signs of bleeding recur  [facility]  office [phone] After 5PM or if no answer call [phone]

## 2020-09-26 NOTE — TOC Progression Note (Signed)
Transition of Care [facility]) - Progression Note    Patient Details  Name: Jodi Bell MRN: [id number] Date of Birth: December 19, 1960  Transition of Care [facility]) CM/SW [place], [place] Phone Number: 09/26/2020, 2:31 PM  Clinical Narrative:     CSW spoke with patient's daughter,Damekia, she confirmed family will transport patient to her out patient dialysis clinic @ [facility] in [place], [place] @ 6am CSW informed [facility] of  plan- SNF agreeable w/plan  CSW will continue to follow and assist with discharge planning.   Thurmond Butts, MSW, LCSW Clinical Social Worker     Expected Discharge Plan: Skilled Nursing Facility Barriers to Discharge: Continued Medical Work up  Expected Discharge Plan and Services Expected Discharge Plan: [place] In-house Referral: Clinical Social Work     Living arrangements for the past 2 months: Single Family Home                                       Social Determinants of Health (SDOH) Interventions    Readmission Risk Interventions No flowsheet data found.

## 2020-09-26 NOTE — Procedures (Signed)
Patient seen on Hemodialysis. BP 126/75 (BP Location: Right Arm)   Pulse 66   Temp (!) 97.3 F (36.3 C) (Oral)   Resp 13   Ht '5\' 2"'$  (1.575 m)   Wt 70.3 kg   SpO2 100%   BMI 28.35 kg/m   QB 400, UF goal 4L Tolerating treatment without complaints at this time.   Elmarie Shiley MD [facility]. Office # [phone] Pager # [phone] 10:24 AM

## 2020-09-26 NOTE — Progress Notes (Signed)
4 Days Post-Op Procedure(s) (LRB): ESOPHAGOGASTRODUODENOSCOPY (EGD) (N/A) HEMOSTASIS CONTROL HEMOSTASIS CLIP PLACEMENT Subjective: No complaints. Some chest soreness.  Objective: Vital signs in last 24 hours: Temp:  [97.8 F (36.6 C)-98.4 F (36.9 C)] 98.4 F (36.9 C) (08/17 0806) Pulse Rate:  [62-143] 69 (08/17 0806) Cardiac Rhythm: Normal sinus rhythm (08/16 2005) Resp:  [11-22] 13 (08/17 0806) BP: (114-166)/(60-98) 129/84 (08/17 0806) SpO2:  [96 %-100 %] 100 % (08/17 0806) Weight:  [66.9 kg] 66.9 kg (08/17 0500)  Hemodynamic parameters for last 24 hours:    Intake/Output from previous day: 08/16 0701 - 08/17 0700 In: 1197 [P.O.:1197] Out: -  Intake/Output this shift: Total I/O In: 240 [P.O.:240] Out: -   General appearance: alert and cooperative Neurologic: intact Heart: regular rate and rhythm Lungs: clear to auscultation bilaterally Extremities: edema mild Wound: incision healing well. Still has some edema of left breast and LUE but improved.  Lab Results: Recent Labs    09/24/20 0340 09/26/20 0132  WBC 9.3 7.6  HGB 8.1* 7.5*  HCT 24.7* 22.9*  PLT 72* 85*   BMET:  Recent Labs    09/24/20 0340 09/26/20 0132  NA 131* 131*  K 4.4 4.2  CL 96* 96*  CO2 26 28  GLUCOSE 100* 129*  BUN 46* 24*  CREATININE 5.85* 4.73*  CALCIUM 9.2 8.5*    PT/INR: No results for input(s): LABPROT, INR in the last 72 hours. ABG    Component Value Date/Time   PHART 7.439 09/17/2020 1014   HCO3 29.6 (H) 09/17/2020 1014   TCO2 31 09/17/2020 1014   ACIDBASEDEF 1.0 09/13/2020 2152   O2SAT 91.0 09/17/2020 1014   CBG (last 3)  No results for input(s): GLUCAP in the last 72 hours.  Assessment/Plan:  POD 13 CABG    Hypertension much improved on oral labetalol. Does decreased last pm to 200 bid due to lower BP.   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 No further melena. Hgb slightly down but  likely due to RBC attrition and dilution. Continue to follow. Protonix per GI. Hold ECASA and Lovenox. Resume ASA at discharge.   HD today per nephrology.  Wt is 12 lbs over preop wt and she has some edema, small pleural effusions. She needs more volume removal with HD as tolerated.   Neurologically back to baseline.   Postop atrial fib: maintaining sinus on po amiodarone.   CTA chest did not show any sign of clot in venous system. Suspect the asymmetric edema in LUE and left breast is probably due to LUE AV fistula with venous hypertension, volume expansion and should improve with removing volume.   IS, OOB, Ambulation.  LOS: 18 days    Jodi Bell 09/26/2020

## 2020-09-26 NOTE — Progress Notes (Signed)
Nutrition follow Up  DOCUMENTATION CODES:   Non-severe (moderate) malnutrition in context of chronic illness  INTERVENTION:   Continue Ensure Enlive po TID, each supplement provides 350 kcal and 20 grams of protein [employer] Continue Renal MVI daily  Recommend NO DIET RESTRICTIONS (other than consistency) given pt with malnutrition and poor appetite with poor po intake  NUTRITION DIAGNOSIS:   Moderate Malnutrition related to chronic illness as evidenced by mild fat depletion, moderate fat depletion, moderate muscle depletion, severe muscle depletion.  Ongoing   GOAL:   Patient will meet greater than or equal to 90% of their needs  Progressing   MONITOR:   PO intake, Supplement acceptance, Labs, Weight trends  REASON FOR ASSESSMENT:   Rounds, LOS    ASSESSMENT:   60 yo female admitted with NSTEMI and required CABG. PMH includes ESRD on HD, HTN, DM, COPD, PAD  8/04 CABG x 2 8/13 s/p EGD showed several discrete bleeding lesions in stomach, EPI + clips 8/14 diet advanced to SOFT diet  Patient seen in HD. States appetite has progressed over the last few days. Was able to tolerate eggs with toast and fruit for breakfast. Last three meal completions charted as 75%, 50%, and 100%. Is being offered Ensure TID but is only to take in two daily. RD encouraged continued meal and supplement intake.   EDW: 64 kg  Current weight: 70.3 kg   Medications: sensipar, aranesp, colace, folic acid, mag-ox, prednisone Labs: Na 131 (L)   Diet Order:   Diet Order             DIET SOFT Room service appropriate? Yes; Fluid consistency: Thin  Diet effective now                   EDUCATION NEEDS:   Education needs have been addressed  Skin:  Skin Assessment: Skin Integrity Issues: Skin Integrity Issues:: Stage II Stage II: buttocks  Last BM:  8/13  Height:   Ht Readings from Last 1 Encounters:  09/13/20 '5\' 2"'$  (1.575 m)    Weight:   Wt  Readings from Last 1 Encounters:  09/26/20 70.3 kg     BMI:  Body mass index is 28.35 kg/m.  Estimated Nutritional Needs:   Kcal:  1800-2100 kcals  Protein:  90-110 g  Fluid:  1000 mL plus UOP  Zakariye Nee MS, RD, LDN, CNSC Clinical Nutrition Pager listed in [place]

## 2020-09-26 NOTE — Progress Notes (Signed)
Pt continues to refuse CPAP at this time.  

## 2020-09-26 NOTE — Progress Notes (Signed)
Patient ID: Jodi Bell, female   DOB: Jun 18, 1960, 60 y.o.   MRN: [id number]  [facility] Progress Note   Assessment/ Plan:   1.  Non-ST elevation MI status post two-vessel CABG yesterday: Presented with volume overload/acute hypoxic respiratory failure and underwent two-vessel CABG on 09/13/2020.   2. ESRD: On schedule for hemodialysis today to continue her usual outpatient Monday/Wednesday/Friday dialysis schedule.  We will continue to challenge/lower dry weight. 3.  Acute blood loss anemia: Hemoglobin/hematocrit continue to trend down, EGD showing dieulafoy lesions with an ulcer at the gastroesophageal junction with visible vessel treated with hemostatic clip.  High risk for diffuse oozing based on EGD finding. 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 5.  Hypertension: Blood pressure improving with restarting labetalol and ongoing HD/UF. 6.  Left arm/breast swelling: No evidence of DVT and at some point if recurrent, may need fistulogram/central venogram to assess for central stenosis.  Subjective:   Transferred to telemetry unit yesterday and denies any acute events overnight.   Objective:   BP 129/84 (BP Location: Right Arm)   Pulse 69   Temp 98.4 F (36.9 C) (Oral)   Resp 13   Ht '5\' 2"'$  (1.575 m)   Wt 66.9 kg   SpO2 100%   BMI 26.98 kg/m   Physical Exam: Gen: Comfortably resting in bed, awaiting transfer to hemodialysis CVS: Pulse regular rhythm, distant heart sounds with well-healed sternotomy scar Resp: Clear to auscultation bilaterally, no rales/rhonchi.  Asymmetric left-sided breast swelling Abd: Soft, obese, nontender, bowel sounds normal Ext: 2+ left upper extremity edema/breast swelling with pulsatile left upper arm fistula.  Labs: BMET Recent Labs  Lab 09/20/20 0053 09/20/20 1537 09/21/20 0315 09/22/20 0639 09/23/20 0410 09/24/20 0340 09/26/20 0132  NA  134*  --  135 136 131* 131* 131*  K 4.0  --  4.5 3.8 4.6 4.4 4.2  CL 97*  --  99 101 97* 96* 96*  CO2 27  --  '27 29 26 26 28  '$ GLUCOSE 85  --  74 84 105* 100* 129*  BUN 26*  --  61* 37* 42* 46* 24*  CREATININE 4.10*  --  5.25* 3.97* 4.90* 5.85* 4.73*  CALCIUM 9.2  --  8.3* 8.4* 8.9 9.2 8.5*  PHOS 1.2* 2.7 2.6 2.0* 3.8 3.9  --    CBC Recent Labs  Lab 09/22/20 0639 09/22/20 1410 09/22/20 2135 09/23/20 0441 09/24/20 0340 09/26/20 0132  WBC 8.4  --   --  8.5 9.3 7.6  NEUTROABS 5.9  --   --   --   --   --   HGB 7.5*   < > 8.7* 8.4* 8.1* 7.5*  HCT 22.8*   < > 26.4* 26.1* 24.7* 22.9*  MCV 86.7  --   --  88.8 88.5 89.1  PLT 78*  --   --  75* 72* 85*   < > = values in this interval not displayed.      Medications:     allopurinol  100 mg Oral QPM   amiodarone  200 mg Oral BID   amLODipine  10 mg Oral Daily   chlorhexidine  15 mL Mouth Rinse BID   Chlorhexidine Gluconate Cloth  6 each Topical Q0600   cholecalciferol  1,000 Units Oral QPM   cinacalcet  30 mg Oral Q M,W,F-HD   cloNIDine  0.1 mg Oral BID   darbepoetin (ARANESP) injection - DIALYSIS  100 mcg  Intravenous Q Wed-HD   docusate sodium  100 mg Oral BID   feeding supplement  237 mL Oral TID BM   ferrous sulfate  325 mg Oral Once per day on Mon Wed Fri   fluticasone  1 spray Each Nare Daily   folic acid  1 mg Oral Daily   Gerhardt's butt cream   Topical BID   hydrALAZINE  100 mg Oral Q8H   hydrocortisone  25 mg Rectal QHS   irbesartan  300 mg Oral Daily   isosorbide mononitrate  30 mg Oral Daily   labetalol  200 mg Oral TID   magnesium oxide  200 mg Oral Daily   mouth rinse  15 mL Mouth Rinse q12n4p   montelukast  10 mg Oral Q supper   multivitamin  1 tablet Oral QHS   pantoprazole (PROTONIX) IV  40 mg Intravenous Q12H   polyvinyl alcohol  1 drop Both Eyes TID AC & HS   predniSONE  5 mg Oral Q breakfast   saccharomyces boulardii  250 mg Oral Daily   sodium chloride flush  3 mL Intravenous Q12H   umeclidinium  bromide  1 puff Inhalation Daily   Elmarie Shiley, MD 09/26/2020, 8:38 AM

## 2020-09-27 MED ORDER — PANTOPRAZOLE SODIUM 40 MG PO TBEC
40.0000 mg | DELAYED_RELEASE_TABLET | Freq: Two times a day (BID) | ORAL | Status: DC
  Administered 2020-09-27 – 2020-09-29 (×6): 40 mg via ORAL
  Filled 2020-09-27 (×6): qty 1

## 2020-09-27 MED ORDER — SORBITOL 70 % SOLN
30.0000 mL | Freq: Once | Status: AC
  Administered 2020-09-27: 30 mL via ORAL
  Filled 2020-09-27: qty 30

## 2020-09-27 MED ORDER — LORAZEPAM 1 MG PO TABS
1.0000 mg | ORAL_TABLET | Freq: Three times a day (TID) | ORAL | Status: DC | PRN
  Administered 2020-09-27 – 2020-10-03 (×7): 1 mg via ORAL
  Filled 2020-09-27 (×7): qty 1

## 2020-09-27 NOTE — Progress Notes (Signed)
Occupational Therapy Treatment Patient Details Name: Jodi Bell MRN: [id number] DOB: 1960/03/13 Today's Date: 09/27/2020    History of present illness Pt is a 60 y.o. female admitted 09/08/20 with acute onset SOB. Workup for CHF decompensaiton, COPD exacerbation, HTN urgency, NSTEMI; need for emergent HD. S/p LHC 8/1 which showed severe two-vessel CAD. CABGx2 8/4. GI bleeding scan negative 08/11. PMH includes ESRD (HD MWF), HTN, DM2, COPD, OSA, psoriatic RA, PAD, anemia, tobacco abuse.   OT comments  Patient supine in bed and agreeable to OT, reports not sleeping well overnight.  Completing transfers and in room mobility using RW with min guard, toileting with supervision and grooming standing with supervision.  Greatly improved tolerance compared to last session, continues to require min cueing for sternal precautions functionally.  Updated dc plan to [facility] if family can provide 24/7 support.    Follow Up Recommendations  Home health OT;Supervision/Assistance - 24 hour;SNF (HHOT if 24/7 support, SNF if family unable to provide support)    Equipment Recommendations  3 in 1 bedside commode    Recommendations for Other Services      Precautions / Restrictions Precautions Precautions: Fall;Sternal Precaution Booklet Issued: Yes (comment) Precaution Comments: reviewed precautions with pt, goodrecall but min cueing to adhere functionally Restrictions Other Position/Activity Restrictions: sternal precautions       Mobility Bed Mobility Overal bed mobility: Needs Assistance Bed Mobility: Supine to Sit     Supine to sit: Supervision;HOB elevated     General bed mobility comments: for safety and precautions    Transfers Overall transfer level: Needs assistance Equipment used: Rolling walker (2 wheeled) Transfers: Sit to/from Stand Sit to Stand: Min guard         General transfer comment: for safety, cueing for hand placement    Balance Overall balance assessment: Needs  assistance Sitting-balance support: No upper extremity supported;Feet supported Sitting balance-Leahy Scale: Fair     Standing balance support: Bilateral upper extremity supported;During functional activity;No upper extremity supported Standing balance-Leahy Scale: Fair Standing balance comment: relies on BUE support dynamically but able to engage in ADLs without UE support and close supervision                           ADL either performed or assessed with clinical judgement   ADL Overall ADL's : Needs assistance/impaired     Grooming: Supervision/safety;Standing;Wash/dry hands               Lower Body Dressing: Min guard;Sit to/from stand Lower Body Dressing Details (indicate cue type and reason): min guard sit to stand, supervision managing socks Toilet Transfer: Min guard;Ambulation;RW;BSC Toilet Transfer Details (indicate cue type and reason): 3:1 over commode Toileting- Clothing Manipulation and Hygiene: Supervision/safety;Sit to/from stand       Functional mobility during ADLs: Min guard;Rolling walker General ADL Comments: activity tolerance improving     Vision       Perception     Praxis      Cognition Arousal/Alertness: Awake/alert Behavior During Therapy: WFL for tasks assessed/performed Overall Cognitive Status: Impaired/Different from baseline Area of Impairment: Memory;Problem solving;Awareness;Safety/judgement                     Memory: Decreased recall of precautions   Safety/Judgement: Decreased awareness of safety Awareness: Emergent Problem Solving: Slow processing;Requires verbal cues General Comments: pt continues to require cueing for safety and sternal precautions        Exercises  Shoulder Instructions       General Comments VSS on RA    Pertinent Vitals/ Pain       Pain Assessment: Faces Faces Pain Scale: Hurts a little bit Pain Location: generalized, incisional Pain Descriptors / Indicators:  Discomfort;Operative site guarding Pain Intervention(s): Monitored during session;Repositioned  Home Living                                          Prior Functioning/Environment              Frequency  Min 2X/week        Progress Toward Goals  OT Goals(current goals can now be found in the care plan section)  Progress towards OT goals: Progressing toward goals  Acute Rehab OT Goals Patient Stated Goal: return home with assist from family OT Goal Formulation: With patient  Plan Frequency remains appropriate;Discharge plan needs to be updated    Co-evaluation                 AM-PAC OT "6 Clicks" Daily Activity     Outcome Measure   Help from another person eating meals?: A Little Help from another person taking care of personal grooming?: A Little Help from another person toileting, which includes using toliet, bedpan, or urinal?: A Little Help from another person bathing (including washing, rinsing, drying)?: A Little Help from another person to put on and taking off regular upper body clothing?: A Little Help from another person to put on and taking off regular lower body clothing?: A Little 6 Click Score: 18    End of Session Equipment Utilized During Treatment: Rolling walker  OT Visit Diagnosis: Unsteadiness on feet (R26.81);Other abnormalities of gait and mobility (R26.89);Muscle weakness (generalized) (M62.81);Other symptoms and signs involving cognitive function;Pain   Activity Tolerance Patient tolerated treatment well   Patient Left in chair;with call bell/phone within reach   Nurse Communication Mobility status        Time: [id number] OT Time Calculation (min): 23 min  Charges: OT General Charges $OT Visit: 1 Visit OT Treatments $Self Care/Home Management : 23-37 mins  [place] Pager [phone] Office [phone]    Delight Stare 09/27/2020, 10:27 AM

## 2020-09-27 NOTE — Progress Notes (Addendum)
      [address]       [place] [address]             [phone]        5 Days Post-Op Procedure(s) (LRB): ESOPHAGOGASTRODUODENOSCOPY (EGD) (N/A) HEMOSTASIS CONTROL HEMOSTASIS CLIP PLACEMENT  Subjective: Patient states she had "left sided chest pain" last evening. She is anxious and asking for a laxative this am.  Objective: Vital signs in last 24 hours: Temp:  [97.3 F (36.3 C)-98.8 F (37.1 C)] 98.7 F (37.1 C) (08/17 2002) Pulse Rate:  [66-82] 77 (08/17 2002) Cardiac Rhythm: Normal sinus rhythm (08/17 1901) Resp:  [9-17] 16 (08/17 2002) BP: (113-147)/(43-84) 113/69 (08/17 2002) SpO2:  [97 %-100 %] 97 % (08/17 2002) Weight:  [67.2 kg-70.3 kg] 67.2 kg (08/17 1228)  Pre op weight  61.3 kg Current Weight  09/26/20 67.2 kg       Intake/Output from previous day: 08/17 0701 - 08/18 0700 In: 600 [P.O.:600] Out: 3101    Physical Exam:  Cardiovascular: RRR Pulmonary: Clear to auscultation bilaterally Abdomen: Soft, non tender, bowel sounds present. Extremities: Trace bilateral lower extremity edema. Neurologic: Grossly intact without focal deficit Wounds: Clean and dry.  No erythema or signs of infection.  Lab Results: CBC: Recent Labs    09/26/20 0132  WBC 7.6  HGB 7.5*  HCT 22.9*  PLT 85*   BMET:  Recent Labs    09/26/20 0132  NA 131*  K 4.2  CL 96*  CO2 28  GLUCOSE 129*  BUN 24*  CREATININE 4.73*  CALCIUM 8.5*    PT/INR:  Lab Results  Component Value Date   INR 1.3 (H) 09/13/2020   INR 1.7 (H) 09/13/2020   INR 1.1 09/12/2020   ABG:  INR: Will add last result for INR, ABG once components are confirmed Will add last 4 CBG results once components are confirmed  Assessment/Plan:  1. CV - Previous a fib. SR with HR in the 70's. On Amiodarone 200 mg bid, Clonidine 0.1 mg bid, Hydralazine 100 mg tid, Irbesartan 300 mg daily, Imdur 30 mg daily, and Labetalol 200 mg tid 2.  Pulmonary - On 2 liters at night. Encourage  incentive spirometer. 3. CKD-had HD 08/17. HD days are Mon/Wed/Fri. Nephrology following. 4.  Expected post op acute blood loss anemia, anemia of chronic disease - H and H yesterday 7.5 and 22.9. On Aranesp 100 mcg every and oral iron 5. Thrombocytopenia-Last platelets up to 85,000 6. Hyponatremia-last sodium remains 131 7. At patient request, LOC constipation 8. GI-s/p EGD 08/13. Non-bleeding gastric ulcer with visible vessel that was clipped and 2 Dieulafoy lesions 9. Edema in LUE and left breast is probably due to LUE AV fistula with venous hypertension as no evidence of DVT 10. Patient with anxiety. She states she took Ativan previously PRN so will order  Sharalyn Ink [facility] 09/27/2020,7:45 AM    Chart reviewed, patient examined, agree with above. She tolerated HD yesterday without problems. 3100 cc removed.

## 2020-09-27 NOTE — Progress Notes (Signed)
PT Cancellation Note  Patient Details Name: [name] MRN: [id number] DOB: 12/07/1960   Cancelled Treatment:    Reason Eval/Treat Not Completed: Fatigue/lethargy limiting ability to participate. Patient declined this morning and asked that I return after lunch. In pm patient observed ambulating in hallway with Rn.  She declined further activity at this time due to fatigue. Will re-attempt tomorrow.      Querida Beretta 09/27/2020, 2:08 PM

## 2020-09-27 NOTE — Progress Notes (Signed)
RT note. Patient refuse cpap tonight, told patient if she decides to change her mind let her RN know and RT will bring cpap up.

## 2020-09-27 NOTE — Progress Notes (Signed)
CARDIAC REHAB PHASE I   PRE:  Rate/Rhythm: 76 SR  BP:  Supine:   Sitting: 141/73  Standing:    SaO2: 99% 2L   MODE:  Ambulation: 140 ft   POST:  Rate/Rhythm: 81 SR  BP:  Supine:   Sitting: 145/79  Standing:    SaO2: 97% 2L 1306-1335 Pt walked 140 ft on 2L with gait belt use, rolling walker and asst x 1. Reinforced sternal precautions. Encouraged IS. Discussed CRP 2 and will refer to [facility] in case pt wants to attend and  to meet protocol. Sats good on 2L, will try on RA next walk. To bed after walk. Tolerated well.    Graylon Good, RN BSN  09/27/2020 1:32 PM

## 2020-09-27 NOTE — Progress Notes (Signed)
Patient ID: Jodi Bell, female   DOB: 14-Dec-1960, 60 y.o.   MRN: [id number]  [facility] Progress Note   Assessment/ Plan:   1.  Non-ST elevation MI status post two-vessel CABG yesterday: Presented with volume overload/acute hypoxic respiratory failure and underwent two-vessel CABG on 09/13/2020.   2. ESRD: She is usually on a Monday/Wednesday/Friday dialysis schedule and I have ordered for hemodialysis again tomorrow.  We will continue to challenge/lower dry weight post CABG. 3.  Acute blood loss anemia: EGD previously done showed dieulafoy lesions with an ulcer at the gastroesophageal junction with visible vessel treated with hemostatic clip.  Downtrending hemoglobin and hematocrit without any overt hematochezia or melena, continue ESA. 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 5.  Hypertension: Blood pressure improving with restarting labetalol and ongoing HD/UF. 6.  Left arm/breast swelling: No evidence of DVT and appears to be improving with ongoing ultrafiltration with hemodialysis.  May need fistulogram as an outpatient to assess for CV stenosis.  Subjective:   Complains of some transient left-sided chest pain last night without diaphoresis or shortness of breath.  Feeling a little restless/anxious this morning.   Objective:   BP (!) 146/67 (BP Location: Right Arm)   Pulse 69   Temp 98.2 F (36.8 C) (Oral)   Resp 17   Ht '5\' 2"'$  (1.575 m)   Wt 67.2 kg   SpO2 96%   BMI 27.10 kg/m   Physical Exam: Gen: Appears comfortable sitting up on the side of her bed, getting ready to work with physical therapy CVS: Pulse regular rhythm, distant heart sounds with well-healed sternotomy scar Resp: Clear to auscultation bilaterally, no rales/rhonchi.  Asymmetric left-sided breast swelling Abd: Soft, obese, nontender, bowel sounds normal Ext: 1+ left upper extremity edema/breast swelling  with pulsatile left upper arm fistula.  Labs: BMET Recent Labs  Lab 09/20/20 1537 09/21/20 0315 09/22/20 0639 09/23/20 0410 09/24/20 0340 09/26/20 0132  NA  --  135 136 131* 131* 131*  K  --  4.5 3.8 4.6 4.4 4.2  CL  --  99 101 97* 96* 96*  CO2  --  '27 29 26 26 28  '$ GLUCOSE  --  74 84 105* 100* 129*  BUN  --  61* 37* 42* 46* 24*  CREATININE  --  5.25* 3.97* 4.90* 5.85* 4.73*  CALCIUM  --  8.3* 8.4* 8.9 9.2 8.5*  PHOS 2.7 2.6 2.0* 3.8 3.9  --    CBC Recent Labs  Lab 09/22/20 0639 09/22/20 1410 09/22/20 2135 09/23/20 0441 09/24/20 0340 09/26/20 0132  WBC 8.4  --   --  8.5 9.3 7.6  NEUTROABS 5.9  --   --   --   --   --   HGB 7.5*   < > 8.7* 8.4* 8.1* 7.5*  HCT 22.8*   < > 26.4* 26.1* 24.7* 22.9*  MCV 86.7  --   --  88.8 88.5 89.1  PLT 78*  --   --  75* 72* 85*   < > = values in this interval not displayed.      Medications:     allopurinol  100 mg Oral QPM   amiodarone  200 mg Oral BID   amLODipine  10 mg Oral Daily   chlorhexidine  15 mL Mouth Rinse BID   Chlorhexidine Gluconate Cloth  6 each Topical Q0600   cholecalciferol  1,000 Units Oral QPM   cinacalcet  30  mg Oral Q M,W,F-HD   cloNIDine  0.1 mg Oral BID   darbepoetin (ARANESP) injection - DIALYSIS  100 mcg Intravenous Q Wed-HD   docusate sodium  100 mg Oral BID   feeding supplement  237 mL Oral TID BM   ferrous sulfate  325 mg Oral Once per day on Mon Wed Fri   fluticasone  1 spray Each Nare Daily   folic acid  1 mg Oral Daily   Gerhardt's butt cream   Topical BID   hydrALAZINE  100 mg Oral Q8H   hydrocortisone  25 mg Rectal QHS   irbesartan  300 mg Oral Daily   isosorbide mononitrate  30 mg Oral Daily   labetalol  200 mg Oral TID   magnesium oxide  200 mg Oral Daily   mouth rinse  15 mL Mouth Rinse q12n4p   montelukast  10 mg Oral Q supper   multivitamin  1 tablet Oral QHS   pantoprazole  40 mg Oral BID   polyvinyl alcohol  1 drop Both Eyes TID AC & HS   predniSONE  5 mg Oral Q breakfast    saccharomyces boulardii  250 mg Oral Daily   sodium chloride flush  3 mL Intravenous Q12H   sorbitol  30 mL Oral Once   umeclidinium bromide  1 puff Inhalation Daily   Elmarie Shiley, MD 09/27/2020, 9:06 AM

## 2020-09-28 LAB — RENAL FUNCTION PANEL
Albumin: 1.7 g/dL — ABNORMAL LOW (ref 3.5–5.0)
Anion gap: 8 (ref 5–15)
BUN: 19 mg/dL (ref 6–20)
CO2: 28 mmol/L (ref 22–32)
Calcium: 9 mg/dL (ref 8.9–10.3)
Chloride: 94 mmol/L — ABNORMAL LOW (ref 98–111)
Creatinine, Ser: 4.49 mg/dL — ABNORMAL HIGH (ref 0.44–1.00)
GFR, Estimated: 11 mL/min — ABNORMAL LOW (ref >60)
Glucose, Bld: 86 mg/dL (ref 70–99)
Phosphorus: 2.6 mg/dL (ref 2.5–4.6)
Potassium: 3.8 mmol/L (ref 3.5–5.1)
Sodium: 130 mmol/L — ABNORMAL LOW (ref 135–145)

## 2020-09-28 LAB — CBC
HCT: 22.1 % — ABNORMAL LOW (ref 36.0–46.0)
Hemoglobin: 6.8 g/dL — CL (ref 12.0–15.0)
MCH: 28.6 pg (ref 26.0–34.0)
MCHC: 30.8 g/dL (ref 30.0–36.0)
MCV: 92.9 fL (ref 80.0–100.0)
Platelets: 153 10*3/uL (ref 150–400)
RBC: 2.38 MIL/uL — ABNORMAL LOW (ref 3.87–5.11)
RDW: 21.6 % — ABNORMAL HIGH (ref 11.5–15.5)
WBC: 7.5 10*3/uL (ref 4.0–10.5)
nRBC: 0 % (ref 0.0–0.2)

## 2020-09-28 LAB — PREPARE RBC (CROSSMATCH)

## 2020-09-28 MED ORDER — SODIUM CHLORIDE 0.9% IV SOLUTION: Freq: Once | INTRAVENOUS | Status: DC

## 2020-09-28 MED ORDER — ONDANSETRON HCL 4 MG/2ML IJ SOLN
INTRAMUSCULAR | Status: AC
  Filled 2020-09-28: qty 2

## 2020-09-28 MED ORDER — ALBUTEROL SULFATE (2.5 MG/3ML) 0.083% IN NEBU: 2.5000 mg | INHALATION_SOLUTION | Freq: Four times a day (QID) | RESPIRATORY_TRACT | Status: DC | PRN

## 2020-09-28 MED ORDER — METOCLOPRAMIDE HCL 5 MG/ML IJ SOLN: 10.0000 mg | Freq: Four times a day (QID) | INTRAMUSCULAR | Status: DC

## 2020-09-28 MED ORDER — METOCLOPRAMIDE HCL 5 MG/ML IJ SOLN
10.0000 mg | Freq: Four times a day (QID) | INTRAMUSCULAR | Status: AC
  Filled 2020-09-28 (×5): qty 2

## 2020-09-28 NOTE — Progress Notes (Signed)
Patient doing well without signs of bleeding and no GI complaints and with sorbitol did have a small bowel movement the other day and was not grossly bloody but it was slightly dark and she does continue to drop her hemoglobin very slowly and we discussed a capsule endoscopy next week if that continues and please call us this weekend if signs of active bleeding or any GI question or problem otherwise we will ask our hospital physician to check on and decide if repeat Endo or capsule is needed

## 2020-09-28 NOTE — Progress Notes (Addendum)
      [address]       [place] [address]             [phone]        6 Days Post-Op Procedure(s) (LRB): ESOPHAGOGASTRODUODENOSCOPY (EGD) (N/A) HEMOSTASIS CONTROL HEMOSTASIS CLIP PLACEMENT  Subjective: Patient with nausea and vomited once this am. She denies abdominal pain and has had a bowel movement recently.  Objective: Vital signs in last 24 hours: Temp:  [98 F (36.7 C)-98.3 F (36.8 C)] 98.1 F (36.7 C) (08/19 0350) Pulse Rate:  [69-80] 72 (08/19 0350) Cardiac Rhythm: Normal sinus rhythm (08/19 0350) Resp:  [13-19] 14 (08/19 0500) BP: (121-157)/(65-88) 144/88 (08/19 0350) SpO2:  [96 %-100 %] 100 % (08/19 0350) Weight:  [64.8 kg] 64.8 kg (08/19 0350)  Pre op weight  61.3 kg Current Weight  09/28/20 64.8 kg       Intake/Output from previous day: 08/18 0701 - 08/19 0700 In: 240 [P.O.:240] Out: -    Physical Exam:  Cardiovascular: RRR, murmur Pulmonary: Clear to auscultation bilaterally Abdomen: Soft, non tender, bowel sounds present. Extremities: Trace bilateral lower extremity edema. Thrill/bruit LUE Neurologic: Grossly intact without focal deficit Wounds: Clean and dry.  No erythema or signs of infection.  Lab Results: CBC: Recent Labs    09/26/20 0132  WBC 7.6  HGB 7.5*  HCT 22.9*  PLT 85*    BMET:  Recent Labs    09/26/20 0132  NA 131*  K 4.2  CL 96*  CO2 28  GLUCOSE 129*  BUN 24*  CREATININE 4.73*  CALCIUM 8.5*     PT/INR:  Lab Results  Component Value Date   INR 1.3 (H) 09/13/2020   INR 1.7 (H) 09/13/2020   INR 1.1 09/12/2020   ABG:  INR: Will add last result for INR, ABG once components are confirmed Will add last 4 CBG results once components are confirmed  Assessment/Plan:  1. CV - S/p NSTEMI. Previous a fib. SR,first degree heart block with HR in the 70's. On Amiodarone 200 mg bid, Clonidine 0.1 mg bid, Hydralazine 100 mg tid, Irbesartan 300 mg daily, Imdur 30 mg daily, and Labetalol 200 mg  tid 2.  Pulmonary - On room air. Encourage incentive spirometer. 3. CKD-had HD 08/17. HD days are Mon/Wed/Fri. Nephrology following. 4.  Expected post op acute blood loss anemia, anemia of chronic disease - H and H yesterday 7.5 and 22.9. On Aranesp 100 mcg every and oral iron 5. Thrombocytopenia-Last platelets up to 85,000 6. Hyponatremia-last sodium remains 131 7. GI-s/p EGD 08/13. Non-bleeding gastric ulcer with visible vessel that was clipped and 2 Dieulafoy lesions 8. Edema in LUE and left breast is probably due to LUE AV fistula with venous hypertension as no evidence of DVT. Per patient, swelling is decreasing.  9. GI-no abdominal tenderness on exam. Will continue Zofran PRN and give Reglan  Donielle M ZimmermanPA-C 09/28/2020,6:54 AM    Chart reviewed, patient examined, agree with above. POD 6 She is gradually improving but slowed due to ESRD, general debilitation and GI bleed. Continue HD per nephrology, encourage nutrition, PT, IS. She probably won't be ready to go home until early next week.

## 2020-09-28 NOTE — Progress Notes (Signed)
[id number] Offered walk. Pt not up to it right now. Has not eaten lunch. Helped pt get comfortable in recliner and set up lunch. Encouraged her to walk with PT later. Graylon Good RN BSN 09/28/2020 1:53 PM

## 2020-09-28 NOTE — Progress Notes (Signed)
[facility] NEPHROLOGY PROGRESS NOTE  Assessment/ Plan:  # Non-ST elevation MI status post two-vessel CABG: Presented with volume overload/acute hypoxic respiratory failure and underwent two-vessel CABG on 09/13/2020.    #  ESRD, MWF schedule, plan for HD today, tolerating well.  We will continue to challenge/lower dry weight post CABG.  #  Acute blood loss anemia: EGD previously done showed dieulafoy lesions with an ulcer at the gastroesophageal junction with visible vessel treated with hemostatic clip.  Downtrending hemoglobin and hematocrit without any overt hematochezia or melena, continue ESA. Plan for a unit of PRBC with dialysis today. Pt agreed.   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 On sensipar, off binders.   #  Hypertension: Blood pressure acceptable.  Continue current antihypertensives and UF during HD.    # Left arm swelling: No evidence of DVT and appears to be improving with ongoing ultrafiltration with hemodialysis.  May need fistulogram as an outpatient to assess for CV stenosis.  Subjective: Seen and examined at dialysis unit.  Plan for a unit of blood transfusion.  Denies nausea vomiting chest pain shortness of breath.  No new event.  Denies any bleeding. Objective Vital signs in last 24 hours: Vitals:   09/28/20 0830 09/28/20 0900 09/28/20 0930 09/28/20 1000  BP: 140/74 (!) 141/83 (!) 150/79 126/64  Pulse: 70 71 78 84  Resp: '18 12 14 14  '$ Temp:      TempSrc:      SpO2:      Weight:      Height:       Weight change: -5.5 kg  Intake/Output Summary (Last 24 hours) at 09/28/2020 1049 Last data filed at 09/27/2020 1631 Gross per 24 hour  Intake 240 ml  Output --  Net 240 ml       Labs: Basic Metabolic Panel: Recent Labs  Lab 09/23/20 0410 09/24/20 0340 09/26/20 0132 09/28/20 0822  NA 131* 131* 131* 130*  K 4.6 4.4 4.2 3.8  CL 97* 96* 96* 94*  CO2 '26 26  28 28  '$ GLUCOSE 105* 100* 129* 86  BUN 42* 46* 24* 19  CREATININE 4.90* 5.85* 4.73* 4.49*  CALCIUM 8.9 9.2 8.5* 9.0  PHOS 3.8 3.9  --  2.6   Liver Function Tests: Recent Labs  Lab 09/22/20 0639 09/23/20 0410 09/28/20 0822  ALBUMIN 1.8* 1.9* 1.7*   No results for input(s): LIPASE, AMYLASE in the last 168 hours. No results for input(s): AMMONIA in the last 168 hours. CBC: Recent Labs  Lab 09/22/20 0639 09/22/20 1410 09/23/20 0441 09/24/20 0340 09/26/20 0132 09/28/20 0822  WBC 8.4  --  8.5 9.3 7.6 7.5  NEUTROABS 5.9  --   --   --   --   --   HGB 7.5*   < > 8.4* 8.1* 7.5* 6.8*  HCT 22.8*   < > 26.1* 24.7* 22.9* 22.1*  MCV 86.7  --  88.8 88.5 89.1 92.9  PLT 78*  --  75* 72* 85* 153   < > = values in this interval not displayed.   Cardiac Enzymes: No results for input(s): CKTOTAL, CKMB, CKMBINDEX, TROPONINI in the last 168 hours. CBG: Recent Labs  Lab 09/22/20 1549 09/22/20 2058 09/22/20 2354 09/23/20 0429 09/23/20 0652  GLUCAP 167* 70 105* 97 98    Iron Studies: No results for input(s): IRON, TIBC, TRANSFERRIN, FERRITIN in the last 72 hours. Studies/Results: No results found.  Medications: Infusions:  sodium chloride     sodium chloride      Scheduled Medications:  sodium chloride   Intravenous Once   sodium chloride   Intravenous Once   allopurinol  100 mg Oral QPM   amiodarone  200 mg Oral BID   amLODipine  10 mg Oral Daily   chlorhexidine  15 mL Mouth Rinse BID   Chlorhexidine Gluconate Cloth  6 each Topical Q0600   cholecalciferol  1,000 Units Oral QPM   cinacalcet  30 mg Oral Q M,W,F-HD   cloNIDine  0.1 mg Oral BID   darbepoetin (ARANESP) injection - DIALYSIS  100 mcg Intravenous Q Wed-HD   docusate sodium  100 mg Oral BID   feeding supplement  237 mL Oral TID BM   ferrous sulfate  325 mg Oral Once per day on Mon Wed Fri   fluticasone  1 spray Each Nare Daily   folic acid  1 mg Oral Daily   Gerhardt's butt cream   Topical BID   hydrALAZINE   100 mg Oral Q8H   hydrocortisone  25 mg Rectal QHS   irbesartan  300 mg Oral Daily   isosorbide mononitrate  30 mg Oral Daily   labetalol  200 mg Oral TID   magnesium oxide  200 mg Oral Daily   mouth rinse  15 mL Mouth Rinse q12n4p   metoCLOPramide (REGLAN) injection  10 mg Intravenous Q6H   montelukast  10 mg Oral Q supper   multivitamin  1 tablet Oral QHS   pantoprazole  40 mg Oral BID   polyvinyl alcohol  1 drop Both Eyes TID AC & HS   predniSONE  5 mg Oral Q breakfast   saccharomyces boulardii  250 mg Oral Daily   sodium chloride flush  3 mL Intravenous Q12H   umeclidinium bromide  1 puff Inhalation Daily    have reviewed scheduled and prn medications.  Physical Exam: General:NAD, comfortable Heart:RRR, s1s2 nl Lungs:clear b/l, no crackle Abdomen:soft, Non-tender, non-distended Extremities:No edema Dialysis Access: Left upper extremity AV fistula has good thrill and bruit.  Gilberto Streck Prasad Landin Tallon 09/28/2020,10:49 AM  LOS: 20 days

## 2020-09-28 NOTE — Progress Notes (Signed)
Physical Therapy Treatment Patient Details Name: Jodi Bell MRN: [id number] DOB: 10-30-1960 Today's Date: 09/28/2020    History of Present Illness Pt is a 60 y.o. female admitted 09/08/20 with acute onset SOB. Workup for CHF decompensation, COPD exacerbation, HTN urgency, NSTEMI; need for emergent HD. S/p LHC 8/1 which showed severe two-vessel CAD. CABGx2 8/4. GI bleeding scan negative 08/11. PMH includes ESRD (HD MWF), HTN, DM2, COPD, OSA, psoriatic RA, PAD, anemia, tobacco abuse.    PT Comments    Pt received seated EOB, c/o fatigue after recent ambulation in hallway with her daughter's assist and RW (not witnessed by PTA) but agreeable to seated/standing exercises for BLE strengthening. Pt performed transfers with min guard progressing to Supervision and good compliance with sternal precautions. Pt requesting to continue 1L O2 [place] for comfort although VSS on RA and BP stable with sit<>stand transition. Pt reports moderate incisional pain, RN notified. Pt continues to benefit from PT services to progress toward functional mobility goals.  Discharge recommendation updated below per discussion with pt and supervising PT Bunnie Philips, pt reports she will have supervision at son's home for mobility.   Follow Up Recommendations  Home health PT;Other (comment);Supervision for mobility/OOB (pt refusing SNF)     Equipment Recommendations  3in1 (PT)    Recommendations for Other Services       Precautions / Restrictions Precautions Precautions: Fall;Sternal Precaution Booklet Issued: Yes (comment) Precaution Comments: good compliance today with transfers Restrictions Weight Bearing Restrictions: No Other Position/Activity Restrictions: sternal precautions    Mobility  Bed Mobility Overal bed mobility: Needs Assistance Bed Mobility: Sit to Supine       Sit to supine: Supervision   General bed mobility comments: fair compliance with sternal precautions    Transfers Overall transfer  level: Needs assistance Equipment used: Rolling walker (2 wheeled) Transfers: Sit to/from Stand Sit to Stand: Min guard         General transfer comment: for safety, cueing for hand placement; able to perform x6 reps total from EOB  Ambulation/Gait Ambulation/Gait assistance: Min guard Gait Distance (Feet): 3 Feet Assistive device: Rolling walker (2 wheeled) Gait Pattern/deviations: Shuffle;Trunk flexed     General Gait Details: sidesteps along EOB only, pt reports she recently ambulated with assist from daughter   Stairs             Wheelchair Mobility    Modified Rankin (Stroke Patients Only)       Balance Overall balance assessment: Needs assistance Sitting-balance support: No upper extremity supported;Feet supported Sitting balance-Leahy Scale: Fair Sitting balance - Comments: no LOB for seated therex with Supervision   Standing balance support: Bilateral upper extremity supported;During functional activity;No upper extremity supported Standing balance-Leahy Scale: Fair Standing balance comment: relies on BUE support dynamically but able to stand with U UE support of RW during BP assessment                            Cognition Arousal/Alertness: Awake/alert Behavior During Therapy: WFL for tasks assessed/performed Overall Cognitive Status: Impaired/Different from baseline Area of Impairment: Memory;Problem solving;Awareness;Safety/judgement                   Current Attention Level: Sustained Memory: Decreased recall of precautions Following Commands: Follows one step commands consistently Safety/Judgement: Decreased awareness of deficits Awareness: Emergent Problem Solving: Slow processing;Requires verbal cues General Comments: fair compliance with sternal precautions today but pt with some decreased insight into importance of mobility/deficits, but  participatory in some tasks with encouragement. Of note, pt refused ambulation with  cardiac rehab and was told to work with PT later in day but had walked in hallway with daughter per her report (I was not there so did not see how far she walked).      Exercises General Exercises - Lower Extremity Long Arc Quad: AROM;Both;Seated;10 reps Other Exercises Other Exercises: STS x 5 reps reciprocal with hands on knees, pt able to perform with min guard/Supervision Other Exercises: seated BLE AROM: ankle pumps, hip flexion x10 reps ea    General Comments General comments (skin integrity, edema, etc.): BP 125/74 (91) seated; BP 114/67 (81) standing, pt  unable to stand >30 seconds prior to needing to sit and c/o fatigue/dizziness earlier when walking with her daughter.      Pertinent Vitals/Pain Pain Assessment: 0-10 Pain Score: 5  Pain Location: generalized, incisional Pain Descriptors / Indicators: Discomfort;Operative site guarding Pain Intervention(s): Limited activity within patient's tolerance;Monitored during session;Patient requesting pain meds-RN notified    Home Living                      Prior Function            PT Goals (current goals can now be found in the care plan section) Acute Rehab PT Goals Patient Stated Goal: return home to stay with son, she will have assist from family PT Goal Formulation: With patient Time For Goal Achievement: 10/04/20 Progress towards PT goals: Progressing toward goals    Frequency    Min 3X/week      PT Plan Current plan remains appropriate    Co-evaluation              AM-PAC PT "6 Clicks" Mobility   Outcome Measure  Help needed turning from your back to your side while in a flat bed without using bedrails?: None Help needed moving from lying on your back to sitting on the side of a flat bed without using bedrails?: A Little Help needed moving to and from a bed to a chair (including a wheelchair)?: A Little Help needed standing up from a chair using your arms (e.g., wheelchair or bedside  chair)?: A Little Help needed to walk in hospital room?: A Little Help needed climbing 3-5 steps with a railing? : A Little 6 Click Score: 19    End of Session Equipment Utilized During Treatment: Oxygen;Other (comment) (pt donned O2 [place] prior to PTA arrival to room and requesting to keep on for comfort (1L/min)) Activity Tolerance: Patient limited by fatigue;Patient limited by pain Patient left: in bed;with call bell/phone within reach;with bed alarm set Nurse Communication: Mobility status;Patient requests pain meds PT Visit Diagnosis: Other abnormalities of gait and mobility (R26.89);Muscle weakness (generalized) (M62.81);Difficulty in walking, not elsewhere classified (R26.2)     Time: [id number] PT Time Calculation (min) (ACUTE ONLY): 16 min  Charges:  $Therapeutic Exercise: 8-22 mins                     Kinzy Weyers P., PTA Acute Rehabilitation Services Pager: [phone] Office: [place] 09/28/2020, 5:28 PM

## 2020-09-29 LAB — CBC
HCT: 26.5 % — ABNORMAL LOW (ref 36.0–46.0)
Hemoglobin: 8.6 g/dL — ABNORMAL LOW (ref 12.0–15.0)
MCH: 28.6 pg (ref 26.0–34.0)
MCHC: 32.5 g/dL (ref 30.0–36.0)
MCV: 88 fL (ref 80.0–100.0)
Platelets: 164 10*3/uL (ref 150–400)
RBC: 3.01 MIL/uL — ABNORMAL LOW (ref 3.87–5.11)
RDW: 23.2 % — ABNORMAL HIGH (ref 11.5–15.5)
WBC: 6.8 10*3/uL (ref 4.0–10.5)
nRBC: 0 % (ref 0.0–0.2)

## 2020-09-29 MED ORDER — SORBITOL 70 % SOLN
30.0000 mL | Freq: Every day | Status: DC | PRN
  Administered 2020-09-29: 30 mL via ORAL
  Filled 2020-09-29 (×2): qty 30

## 2020-09-29 NOTE — Progress Notes (Addendum)
      [address]       [place] [address]             [phone]      7 Days Post-Op Procedure(s) (LRB): ESOPHAGOGASTRODUODENOSCOPY (EGD) (N/A) HEMOSTASIS CONTROL HEMOSTASIS CLIP PLACEMENT Subjective: Feels okay this morning, feels like her left breast is less swollen  Objective: Vital signs in last 24 hours: Temp:  [98.2 F (36.8 C)-98.8 F (37.1 C)] 98.6 F (37 C) (08/20 0336) Pulse Rate:  [70-84] 74 (08/20 0336) Cardiac Rhythm: Heart block (08/20 0746) Resp:  [12-20] 15 (08/20 0336) BP: (114-161)/(63-83) 161/73 (08/20 0336) SpO2:  [99 %-100 %] 99 % (08/20 0336) Weight:  [61.5 kg] 61.5 kg (08/19 1141)     Intake/Output from previous day: 08/19 0701 - 08/20 0700 In: 388.3 [Blood:388.3] Out: 3500  Intake/Output this shift: No intake/output data recorded.  General appearance: alert, cooperative, and no distress Heart: regular rate and rhythm, S1, S2 normal, no murmur, click, rub or gallop Lungs: clear to auscultation bilaterally Abdomen: soft, non-tender; bowel sounds normal; no masses,  no organomegaly Extremities: extremities normal, atraumatic, no cyanosis or edema Wound: clean and dry  Lab Results: Recent Labs    09/28/20 0822 09/29/20 0149  WBC 7.5 6.8  HGB 6.8* 8.6*  HCT 22.1* 26.5*  PLT 153 164   BMET:  Recent Labs    09/28/20 0822  NA 130*  K 3.8  CL 94*  CO2 28  GLUCOSE 86  BUN 19  CREATININE 4.49*  CALCIUM 9.0    PT/INR: No results for input(s): LABPROT, INR in the last 72 hours. ABG    Component Value Date/Time   PHART 7.439 09/17/2020 1014   HCO3 29.6 (H) 09/17/2020 1014   TCO2 31 09/17/2020 1014   ACIDBASEDEF 1.0 09/13/2020 2152   O2SAT 91.0 09/17/2020 1014   CBG (last 3)  No results for input(s): GLUCAP in the last 72 hours.  Assessment/Plan: S/P Procedure(s) (LRB): ESOPHAGOGASTRODUODENOSCOPY (EGD) (N/A) HEMOSTASIS CONTROL HEMOSTASIS CLIP PLACEMENT  1. CV - S/p NSTEMI. Previous a fib. SR, 70s. BP  [id number] systolic. On Amiodarone 200 mg bid, Clonidine 0.1 mg bid, Hydralazine 100 mg tid, Irbesartan 300 mg daily, Imdur 30 mg daily, and Labetalol 200 mg tid 2.  Pulmonary - On room air. Encourage incentive spirometer. 3. CKD-HD days are Mon/Wed/Fri. Nephrology following. 4.  Expected post op acute blood loss anemia, anemia of chronic disease - H and H  today 8.6/26.5 which is improved.  On Aranesp 100 mcg every and oral iron 5. Hyponatremia-last sodium remains 130 7. GI-s/p EGD 08/13. Non-bleeding gastric ulcer with visible vessel that was clipped and 2 Dieulafoy lesions 8. Edema in LUE and left breast is probably due to LUE AV fistula with venous hypertension as no evidence of DVT. Per patient, swelling is decreasing.  9. GI-no abdominal tenderness on exam. Will continue Zofran PRN and give Reglan. Some constipation and sorbitol works for her. Will order.    LOS: 21 days    Jodi Bell 09/29/2020  Patient seen and examined, agree with assessment and plan as outlined above  Remo Lipps C. Roxan Hockey, MD [facility] [phone]

## 2020-09-29 NOTE — Progress Notes (Signed)
[facility] NEPHR[facility] PROGRESS NOTE  Assessment/ Plan:  # Non-ST elevation MI status post two-vessel CABG: Presented with volume overload/acute hypoxic respiratory failure and underwent two-vessel CABG on 09/13/2020.    #  ESRD, MWF schedule at [facility], status post HD on 8/19 with 3.5 L UF, tolerated well.  Plan for next HD on 8/22. We will continue to challenge/lower dry weight post CABG.  #  Acute blood loss anemia: EGD previously done showed dieulafoy lesions with an ulcer at the gastroesophageal junction with visible vessel treated with hemostatic clip.  Received a unit of blood transfusion with dialysis on 8/19.  Hemoglobin is stable.  Continue ESA.  GI is following for possible capsule endoscopy.   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 On sensipar, off binders.   #  Hypertension: Blood pressure acceptable.  Continue current antihypertensives and UF during HD.    # Left arm swelling: No evidence of DVT and appears to be improving with ongoing ultrafiltration with hemodialysis.  May need fistulogram as an outpatient to assess for CV stenosis.  Subjective: Seen and examined at dialysis unit. No new event.  Tolerated dialysis well.  Denies nausea vomiting chest pain shortness of breath.  No GI bleed.  Objective Vital signs in last 24 hours: Vitals:   09/28/20 1958 09/28/20 2348 09/29/20 0336 09/29/20 0858  BP: (!) 147/75 (!) 154/80 (!) 161/73 (!) 156/68  Pulse: 73 76 74 71  Resp: '14 14 15 18  '$ Temp: 98.2 F (36.8 C) 98.5 F (36.9 C) 98.6 F (37 C) 98.1 F (36.7 C)  TempSrc: Oral Oral Oral Oral  SpO2: 100% 100% 99% 100%  Weight:      Height:       Weight change: 0.2 kg  Intake/Output Summary (Last 24 hours) at 09/29/2020 0948 Last data filed at 09/28/2020 1141 Gross per 24 hour  Intake 388.33 ml  Output 3500 ml  Net -3111.67 ml        Labs: Basic Metabolic  Panel: Recent Labs  Lab 09/23/20 0410 09/24/20 0340 09/26/20 0132 09/28/20 0822  NA 131* 131* 131* 130*  K 4.6 4.4 4.2 3.8  CL 97* 96* 96* 94*  CO2 '26 26 28 28  '$ GLUCOSE 105* 100* 129* 86  BUN 42* 46* 24* 19  CREATININE 4.90* 5.85* 4.73* 4.49*  CALCIUM 8.9 9.2 8.5* 9.0  PHOS 3.8 3.9  --  2.6    Liver Function Tests: Recent Labs  Lab 09/23/20 0410 09/28/20 0822  ALBUMIN 1.9* 1.7*    No results for input(s): LIPASE, AMYLASE in the last 168 hours. No results for input(s): AMMONIA in the last 168 hours. CBC: Recent Labs  Lab 09/23/20 0441 09/24/20 0340 09/26/20 0132 09/28/20 0822 09/29/20 0149  WBC 8.5 9.3 7.6 7.5 6.8  HGB 8.4* 8.1* 7.5* 6.8* 8.6*  HCT 26.1* 24.7* 22.9* 22.1* 26.5*  MCV 88.8 88.5 89.1 92.9 88.0  PLT 75* 72* 85* 153 164    Cardiac Enzymes: No results for input(s): CKTOTAL, CKMB, CKMBINDEX, TROPONINI in the last 168 hours. CBG: Recent Labs  Lab 09/22/20 1549 09/22/20 2058 09/22/20 2354 09/23/20 0429 09/23/20 0652  GLUCAP 167* 70 105* 97 98     Iron Studies: No results for input(s): IRON, TIBC, TRANSFERRIN, FERRITIN in the last 72 hours. Studies/Results: No results found.  Medications: Infusions:  sodium chloride     sodium chloride      Scheduled Medications:  sodium chloride  Intravenous Once   sodium chloride   Intravenous Once   allopurinol  100 mg Oral QPM   amiodarone  200 mg Oral BID   amLODipine  10 mg Oral Daily   chlorhexidine  15 mL Mouth Rinse BID   Chlorhexidine Gluconate Cloth  6 each Topical Q0600   cholecalciferol  1,000 Units Oral QPM   cinacalcet  30 mg Oral Q M,W,F-HD   cloNIDine  0.1 mg Oral BID   darbepoetin (ARANESP) injection - DIALYSIS  100 mcg Intravenous Q Wed-HD   docusate sodium  100 mg Oral BID   feeding supplement  237 mL Oral TID BM   ferrous sulfate  325 mg Oral Once per day on Mon Wed Fri   fluticasone  1 spray Each Nare Daily   folic acid  1 mg Oral Daily   Gerhardt's butt cream   Topical  BID   hydrALAZINE  100 mg Oral Q8H   hydrocortisone  25 mg Rectal QHS   irbesartan  300 mg Oral Daily   isosorbide mononitrate  30 mg Oral Daily   labetalol  200 mg Oral TID   magnesium oxide  200 mg Oral Daily   mouth rinse  15 mL Mouth Rinse q12n4p   metoCLOPramide (REGLAN) injection  10 mg Intravenous Q6H   montelukast  10 mg Oral Q supper   multivitamin  1 tablet Oral QHS   pantoprazole  40 mg Oral BID   polyvinyl alcohol  1 drop Both Eyes TID AC & HS   predniSONE  5 mg Oral Q breakfast   saccharomyces boulardii  250 mg Oral Daily   sodium chloride flush  3 mL Intravenous Q12H   umeclidinium bromide  1 puff Inhalation Daily    have reviewed scheduled and prn medications.  Physical Exam: General:NAD, looks comfortable Heart:RRR, s1s2 nl Lungs:clear b/l, no crackle Abdomen:soft, Non-tender, non-distended Extremities:No edema Dialysis Access: Left upper extremity AV fistula has good thrill and bruit.  Jodi Bell Jodi Bell 09/29/2020,9:48 AM  LOS: 21 days

## 2020-09-29 NOTE — Progress Notes (Signed)
CARDIAC REHAB PHASE I   Pt refused offer to walk due to being in pain. She stated she is in too much pain. She said the RN had just given her some medication but it had not kicked in yet. She said she would walk later with her son. Encouraged ambulation today and tomorrow. Pt was receptive.   Rick Duff, MS, ACSM-CEP 09/29/2020 12:29 PM

## 2020-09-30 ENCOUNTER — Inpatient Hospital Stay (HOSPITAL_COMMUNITY): Payer: Medicare PPO

## 2020-09-30 DIAGNOSIS — Z951 Presence of aortocoronary bypass graft: Secondary | ICD-10-CM | POA: Diagnosis not present

## 2020-09-30 LAB — CBC
HCT: 24 % — ABNORMAL LOW (ref 36.0–46.0)
Hemoglobin: 7.5 g/dL — ABNORMAL LOW (ref 12.0–15.0)
MCH: 28.4 pg (ref 26.0–34.0)
MCHC: 31.3 g/dL (ref 30.0–36.0)
MCV: 90.9 fL (ref 80.0–100.0)
Platelets: 187 10*3/uL (ref 150–400)
RBC: 2.64 MIL/uL — ABNORMAL LOW (ref 3.87–5.11)
RDW: 23 % — ABNORMAL HIGH (ref 11.5–15.5)
WBC: 6 10*3/uL (ref 4.0–10.5)
nRBC: 0 % (ref 0.0–0.2)

## 2020-09-30 LAB — HEMOGLOBIN AND HEMATOCRIT, BLOOD
HCT: 19.8 % — ABNORMAL LOW (ref 36.0–46.0)
Hemoglobin: 6.2 g/dL — CL (ref 12.0–15.0)

## 2020-09-30 LAB — PREPARE RBC (CROSSMATCH)

## 2020-09-30 LAB — GLUCOSE, CAPILLARY: Glucose-Capillary: 106 mg/dL — ABNORMAL HIGH (ref 70–99)

## 2020-09-30 MED ORDER — CHLORHEXIDINE GLUCONATE CLOTH 2 % EX PADS
6.0000 | MEDICATED_PAD | Freq: Every day | CUTANEOUS | Status: DC
  Administered 2020-10-01 – 2020-10-04 (×4): 6 via TOPICAL

## 2020-09-30 MED ORDER — PANTOPRAZOLE INFUSION (NEW) - SIMPLE MED
8.0000 mg/h | INTRAVENOUS | Status: DC
  Administered 2020-09-30 – 2020-10-03 (×6): 8 mg/h via INTRAVENOUS
  Filled 2020-09-30 (×4): qty 80
  Filled 2020-09-30 (×2): qty 100
  Filled 2020-09-30: qty 80
  Filled 2020-09-30: qty 100
  Filled 2020-09-30: qty 80

## 2020-09-30 MED ORDER — SODIUM CHLORIDE 0.9% IV SOLUTION: Freq: Once | INTRAVENOUS | Status: DC

## 2020-09-30 MED ORDER — PANTOPRAZOLE 80MG IVPB - SIMPLE MED
80.0000 mg | Freq: Once | INTRAVENOUS | Status: AC
  Administered 2020-09-30: 80 mg via INTRAVENOUS
  Filled 2020-09-30: qty 80

## 2020-09-30 NOTE — Procedures (Signed)
Central Venous Catheter Insertion Procedure Note  Jodi Bell  [id number]  1960-02-14  Date:09/30/20  Time:5:18 PM   Provider Performing:Meshulem Onorato Loletha Grayer Tamala Julian   Procedure: Insertion of Non-tunneled Central Venous [phone]) with US guidance [id number])   Indication(s) Difficult access  Consent Risks of the procedure as well as the alternatives and risks of each were explained to the patient and/or caregiver.  Consent for the procedure was obtained and is signed in the bedside chart  Anesthesia Topical only with 1% lidocaine   Timeout Verified patient identification, verified procedure, site/side was marked, verified correct patient position, special equipment/implants available, medications/allergies/relevant history reviewed, required imaging and test results available.  Sterile Technique Maximal sterile technique including full sterile barrier drape, hand hygiene, sterile gown, sterile gloves, mask, hair covering, sterile ultrasound probe cover (if used).  Procedure Description Area of catheter insertion was cleaned with chlorhexidine and draped in sterile fashion.  With real-time ultrasound guidance a central venous catheter was placed into the left internal jugular vein. Nonpulsatile blood flow and easy flushing noted in all ports.  The catheter was sutured in place and sterile dressing applied.  Complications/Tolerance None; patient tolerated the procedure well. Chest X-ray is ordered to verify placement for internal jugular or subclavian cannulation.   Chest x-ray is not ordered for femoral cannulation.  EBL Minimal  Specimen(s) None

## 2020-09-30 NOTE — Progress Notes (Signed)
[facility] Gastroenterology Progress Note  Jodi Bell 60 y.o. [birth year]  CC: GI bleed   Subjective: Received call from RN that patient has been having rectal bleeding this morning.  Patient seen and examined at bedside.  Family/daughter at bedside who is somewhat upset because of patient's rectal bleeding.  Discussed with patient and family at bedside. Few bowel movements with bright red blood per rectum this morning.  Had some abdominal cramps this morning which has resolved now.  Denies nausea and vomiting  ROS : Afebrile, complaining of shortness of breath   Objective: Vital signs in last 24 hours: Vitals:   09/30/20 0410 09/30/20 0821  BP: (!) 156/76 (!) 155/71  Pulse: 73 71  Resp: 15 11  Temp: 98.2 F (36.8 C) 99 F (37.2 C)  SpO2: 98% 100%    Physical Exam:  General:   alert, cooperative , not in acute distress HEENT : Normocephalic, atraumatic, extraocular movement intact CHEST : Midline incision from recent bypass surgery noted Heart:  Regular rate and rhythm; no murmurs, clicks, rubs,  or gallops. Abdomen: Soft, nontender, nondistended, bowel sounds present. Neuro : Alert and oriented x3 Psych : anxious Rectal:  Deferred  Lab Results: Recent Labs    09/28/20 0822  NA 130*  K 3.8  CL 94*  CO2 28  GLUCOSE 86  BUN 19  CREATININE 4.49*  CALCIUM 9.0  PHOS 2.6   Recent Labs    09/28/20 0822  ALBUMIN 1.7*   Recent Labs    09/29/20 0149 09/30/20 0854  WBC 6.8 6.0  HGB 8.6* 7.5*  HCT 26.5* 24.0*  MCV 88.0 90.9  PLT 164 187   No results for input(s): LABPROT, INR in the last 72 hours.    Assessment/Plan: -Rectal bleeding.  Patient with history of GI bleed.   Started having bright red blood per rectum this morning.    GI bleeding scan was negative on September 17, 2020.  EGD September 22, 2020 by Dr. Paulita Fujita showed small gastric ulcers with visible vessel as well as possible lesions Dieulafoy lesion which was treated with injection and clips  placement.     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  RN was also at bedside.  Patient's vital signs are stable.  No tachycardia.  No hypotension. -Based on recent endoscopy findings, there is a concern for diffuse mucosal bleeding, which in general is difficult to control by endoscopic treatment. -Recommend stat CT angio to localize bleeding spot.(Gastric versus small bowel versus colonic)  -If CT angio negative, recommend EGD and colonoscopy tomorrow -Transfuse 1 unit of PRBC -Monitor H&H.  Transfuse if hemoglobin less than 8 -Protonix drip -GI will follow   Otis Brace MD, [place] 09/30/2020, 10:40 AM  Contact #  [phone]

## 2020-09-30 NOTE — Progress Notes (Signed)
Spoke with GI Dr. Alessandra Bevels and updated daughter that he was in procedure at [place] and will see pt afternoon. Daughter wanted another GI doctor called. PA Royal Piedra, Dr. Roxan Hockey, and Dr. Alessandra Bevels are aware. Pt stable with GIB. VSS, no vomiting, SOB or dizziness. Pt resting with call bell within reach.  Will continue to monitor.

## 2020-09-30 NOTE — Progress Notes (Signed)
      [address]       [place] [address]             [phone]      Lying bed, says she feels "washed out"  BP 128/60 (BP Location: Right Arm)   Pulse 74   Temp 98.6 F (37 C) (Oral)   Resp 18   Ht '5\' 2"'$  (1.575 m)   Wt 61.5 kg   SpO2 100%   BMI 24.80 kg/m   Lost Iv access so hasn't received blood yet and Ct angio was cancelled  Dr. Tamala Julian placed central line and CXR shows it is good position  Will go ahead and give 1 unit PRBC then proceed with Ct angio and give 2nd unit after that  Willard. Roxan Hockey, MD [facility] [phone]

## 2020-09-30 NOTE — Progress Notes (Signed)
Updated patient on plan of care. Will contact GI again if daughter returns and wants to speak to him. Payton Emerald, RN

## 2020-09-30 NOTE — Progress Notes (Signed)
Pt daughter came to desk concerned her mother was bleeding profusely from her abdomen.  Assessed patient and she had bloody clots in bedside commode from her rectum. Updated daughter on patient GIB starting this morning. Pt had one bloody stool this morning and PA Tessa called. Second stool darker in color. Third stool was frank blood with clots which daughter saw. CBC ordered and GI paged to see patient. Pt had chest xray for concern of feeling more SOB since surgery. Paged GI and will update daughter. Payton Emerald, RN

## 2020-09-30 NOTE — Progress Notes (Signed)
Denies any chest pain of SOB. Blood transfusion cont. No reactions noted within the 15 minute monitoring. VSS

## 2020-09-30 NOTE — Progress Notes (Signed)
-   Received call from RN for IV access issues.  Not able to get CT angio at this time because of IV access issues.    - RN was advised to call primary attending/admitting team regarding IV access issues.  -Ideally patient with GI bleed needs two IV lines/ IV access.   Otis Brace MD, [place] 09/30/2020, 3:53 PM  Contact #  [phone]

## 2020-09-30 NOTE — Progress Notes (Addendum)
[address]       [place] [address]             [phone]      8 Days Post-Op Procedure(s) (LRB): ESOPHAGOGASTRODUODENOSCOPY (EGD) (N/A) HEMOSTASIS CONTROL HEMOSTASIS CLIP PLACEMENT Subjective: Bright red bloody bowel movement this morning with diffuse abdominal pain.  Objective: Vital signs in last 24 hours: Temp:  [97.9 F (36.6 C)-98.6 F (37 C)] 98.2 F (36.8 C) (08/21 0410) Pulse Rate:  [66-78] 73 (08/21 0410) Cardiac Rhythm: Normal sinus rhythm (08/20 1900) Resp:  [15-19] 15 (08/21 0410) BP: (144-156)/(68-86) 156/76 (08/21 0410) SpO2:  [98 %-100 %] 98 % (08/21 0410)     Intake/Output from previous day: 08/20 0701 - 08/21 0700 In: 120 [P.O.:120] Out: 4 [Urine:3; Stool:1] Intake/Output this shift: No intake/output data recorded.  General appearance: alert, cooperative, and no distress Heart: regular rate and rhythm, S1, S2 normal, no murmur, click, rub or gallop Lungs: clear to auscultation bilaterally and diminished in the lower lobes Abdomen: diffuse tenderness Extremities: extremities normal, atraumatic, no cyanosis or edema Wound: clean and dry sternal incision  Lab Results: Recent Labs    09/28/20 0822 09/29/20 0149  WBC 7.5 6.8  HGB 6.8* 8.6*  HCT 22.1* 26.5*  PLT 153 164   BMET:  Recent Labs    09/28/20 0822  NA 130*  K 3.8  CL 94*  CO2 28  GLUCOSE 86  BUN 19  CREATININE 4.49*  CALCIUM 9.0    PT/INR: No results for input(s): LABPROT, INR in the last 72 hours. ABG    Component Value Date/Time   PHART 7.439 09/17/2020 1014   HCO3 29.6 (H) 09/17/2020 1014   TCO2 31 09/17/2020 1014   ACIDBASEDEF 1.0 09/13/2020 2152   O2SAT 91.0 09/17/2020 1014   CBG (last 3)  No results for input(s): GLUCAP in the last 72 hours.  Assessment/Plan: S/P Procedure(s) (LRB): ESOPHAGOGASTRODUODENOSCOPY (EGD) (N/A) HEMOSTASIS CONTROL HEMOSTASIS CLIP PLACEMENT  1. CV - S/p NSTEMI. Previous a fib. SR, 70s. BP [id number]  systolic. On Amiodarone 200 mg bid, Clonidine 0.1 mg bid, Hydralazine 100 mg tid, Irbesartan 300 mg daily, Imdur 30 mg daily, and Labetalol 200 mg tid 2.  Pulmonary - On room air. Encourage incentive spirometer. CXR pending. 3. CKD-HD days are Mon/Wed/Fri. Nephrology following. 4.  Expected post op acute blood loss anemia, anemia of chronic disease - last H and H  8.6/26.5 which is improved.  On Aranesp 100 mcg every and oral iron 5. Hyponatremia-last sodium remains 130 7. GI-s/p EGD 08/13. Non-bleeding gastric ulcer with visible vessel that was clipped and 2 Dieulafoy lesions. Now with new bloody diarrhea and abdominal pain. GI re-consulted 8. Edema in LUE and left breast is probably due to LUE AV fistula with venous hypertension as no evidence of DVT. Per patient, swelling is decreasing.   Plan: GI to see today regarding bloody bowel movement. Will f/u with CBC and CXR. H and H dropped to 7.5/24.0 from 8.6/26.5. May need transfusion.     LOS: 22 days    Elgie Collard 09/30/2020  Patient seen and examined, agree with above Call has been placed to [facility] match ordered but will hold off on transfusion for now.  Monitor H&H Spoke with her daughter who is unhappy GI has not been by yet  Remo Lipps C. Roxan Hockey, MD [facility] [phone]  I did speak with Dr. Alessandra Bevels of [facility] at about 11 AM and  he will see at his earliest opportunity  Revonda Standard. Roxan Hockey, MD [facility] [phone]

## 2020-09-30 NOTE — Progress Notes (Signed)
[facility] NEPHROLOGY PROGRESS NOTE  Assessment/ Plan:  # Non-ST elevation MI status post two-vessel CABG: Presented with volume overload/acute hypoxic respiratory failure and underwent two-vessel CABG on 09/13/2020.    #  ESRD, MWF schedule at [facility], status post HD on 8/19 with 3.5 L UF, tolerated well.  Plan for next HD on 8/22. We will continue to challenge/lower dry weight post CABG.  #  Acute blood loss anemia: EGD previously done showed dieulafoy lesions with an ulcer at the gastroesophageal junction with visible vessel treated with hemostatic clip.  Received a unit of blood transfusion with dialysis on 8/19. Another episode of GI bleed yesterday and now GI is following for possible capsule endoscopy.  Continue ESA and blood transfusion as needed.   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 On sensipar, off binders.   #  Hypertension: Blood pressure acceptable.  Continue current antihypertensives and UF during HD.    # Left arm swelling: No evidence of DVT and appears to be improving with ongoing ultrafiltration with hemodialysis.  May need fistulogram as an outpatient to assess for CV stenosis.  Subjective: Seen and examined .  Having GI bleed.  Denies nausea vomiting chest pain shortness of breath.  Objective Vital signs in last 24 hours: Vitals:   09/29/20 2313 09/29/20 2320 09/30/20 0410 09/30/20 0821  BP:  (!) 156/68 (!) 156/76 (!) 155/71  Pulse: 67 66 73 71  Resp: '18 15 15 11  '$ Temp:  97.9 F (36.6 C) 98.2 F (36.8 C) 99 F (37.2 C)  TempSrc:  Oral Oral Oral  SpO2: 98% 100% 98% 100%  Weight:      Height:       Weight change:   Intake/Output Summary (Last 24 hours) at 09/30/2020 1142 Last data filed at 09/30/2020 0410 Gross per 24 hour  Intake 120 ml  Output 4 ml  Net 116 ml        Labs: Basic Metabolic Panel: Recent Labs  Lab 09/24/20 0340  09/26/20 0132 09/28/20 0822  NA 131* 131* 130*  K 4.4 4.2 3.8  CL 96* 96* 94*  CO2 '26 28 28  '$ GLUCOSE 100* 129* 86  BUN 46* 24* 19  CREATININE 5.85* 4.73* 4.49*  CALCIUM 9.2 8.5* 9.0  PHOS 3.9  --  2.6    Liver Function Tests: Recent Labs  Lab 09/28/20 0822  ALBUMIN 1.7*    No results for input(s): LIPASE, AMYLASE in the last 168 hours. No results for input(s): AMMONIA in the last 168 hours. CBC: Recent Labs  Lab 09/24/20 0340 09/26/20 0132 09/28/20 0822 09/29/20 0149 09/30/20 0854  WBC 9.3 7.6 7.5 6.8 6.0  HGB 8.1* 7.5* 6.8* 8.6* 7.5*  HCT 24.7* 22.9* 22.1* 26.5* 24.0*  MCV 88.5 89.1 92.9 88.0 90.9  PLT 72* 85* 153 164 187    Cardiac Enzymes: No results for input(s): CKTOTAL, CKMB, CKMBINDEX, TROPONINI in the last 168 hours. CBG: No results for input(s): GLUCAP in the last 168 hours.   Iron Studies: No results for input(s): IRON, TIBC, TRANSFERRIN, FERRITIN in the last 72 hours. Studies/Results: No results found.  Medications: Infusions:  sodium chloride     sodium chloride      Scheduled Medications:  sodium chloride   Intravenous Once   sodium chloride   Intravenous Once   allopurinol  100 mg Oral QPM   amiodarone  200 mg Oral BID   amLODipine  10 mg Oral  Daily   chlorhexidine  15 mL Mouth Rinse BID   Chlorhexidine Gluconate Cloth  6 each Topical Q0600   cholecalciferol  1,000 Units Oral QPM   cinacalcet  30 mg Oral Q M,W,F-HD   cloNIDine  0.1 mg Oral BID   darbepoetin (ARANESP) injection - DIALYSIS  100 mcg Intravenous Q Wed-HD   docusate sodium  100 mg Oral BID   feeding supplement  237 mL Oral TID BM   fluticasone  1 spray Each Nare Daily   folic acid  1 mg Oral Daily   Gerhardt's butt cream   Topical BID   hydrALAZINE  100 mg Oral Q8H   hydrocortisone  25 mg Rectal QHS   irbesartan  300 mg Oral Daily   isosorbide mononitrate  30 mg Oral Daily   labetalol  200 mg Oral TID   magnesium oxide  200 mg Oral Daily   mouth rinse  15 mL Mouth  Rinse q12n4p   montelukast  10 mg Oral Q supper   multivitamin  1 tablet Oral QHS   pantoprazole  40 mg Oral BID   polyvinyl alcohol  1 drop Both Eyes TID AC & HS   predniSONE  5 mg Oral Q breakfast   saccharomyces boulardii  250 mg Oral Daily   sodium chloride flush  3 mL Intravenous Q12H   umeclidinium bromide  1 puff Inhalation Daily    have reviewed scheduled and prn medications.  Physical Exam: General:NAD, looks comfortable Heart:RRR, s1s2 nl Lungs:clear b/l, no crackle Abdomen:soft, Non-tender, non-distended Extremities:No edema Dialysis Access: Left upper extremity AV fistula has good thrill and bruit.  Jodi Bell Prasad Daphene Chisholm 09/30/2020,11:42 AM  LOS: 22 days

## 2020-10-01 ENCOUNTER — Inpatient Hospital Stay (HOSPITAL_COMMUNITY): Payer: Medicare PPO

## 2020-10-01 DIAGNOSIS — D500 Iron deficiency anemia secondary to blood loss (chronic): Secondary | ICD-10-CM

## 2020-10-01 LAB — BPAM RBC
Blood Product Expiration Date: 202209082359
Blood Product Expiration Date: 202209122359
Blood Product Expiration Date: 202209202359
ISSUE DATE / TIME: 202208191053
ISSUE DATE / TIME: 202208211842
ISSUE DATE / TIME: 202208212220
Unit Type and Rh: 6200
Unit Type and Rh: 8400
Unit Type and Rh: 8400

## 2020-10-01 LAB — TYPE AND SCREEN
ABO/RH(D): AB POS
Antibody Screen: NEGATIVE
Unit division: 0
Unit division: 0
Unit division: 0

## 2020-10-01 LAB — RENAL FUNCTION PANEL
Albumin: 1.7 g/dL — ABNORMAL LOW (ref 3.5–5.0)
Anion gap: 7 (ref 5–15)
BUN: 27 mg/dL — ABNORMAL HIGH (ref 6–20)
CO2: 26 mmol/L (ref 22–32)
Calcium: 8.8 mg/dL — ABNORMAL LOW (ref 8.9–10.3)
Chloride: 96 mmol/L — ABNORMAL LOW (ref 98–111)
Creatinine, Ser: 5.11 mg/dL — ABNORMAL HIGH (ref 0.44–1.00)
GFR, Estimated: 9 mL/min — ABNORMAL LOW (ref >60)
Glucose, Bld: 91 mg/dL (ref 70–99)
Phosphorus: 4.9 mg/dL — ABNORMAL HIGH (ref 2.5–4.6)
Potassium: 5.2 mmol/L — ABNORMAL HIGH (ref 3.5–5.1)
Sodium: 129 mmol/L — ABNORMAL LOW (ref 135–145)

## 2020-10-01 LAB — CBC
HCT: 24.4 % — ABNORMAL LOW (ref 36.0–46.0)
Hemoglobin: 8 g/dL — ABNORMAL LOW (ref 12.0–15.0)
MCH: 28.1 pg (ref 26.0–34.0)
MCHC: 32.8 g/dL (ref 30.0–36.0)
MCV: 85.6 fL (ref 80.0–100.0)
Platelets: 156 10*3/uL (ref 150–400)
RBC: 2.85 MIL/uL — ABNORMAL LOW (ref 3.87–5.11)
RDW: 20.9 % — ABNORMAL HIGH (ref 11.5–15.5)
WBC: 6.3 10*3/uL (ref 4.0–10.5)
nRBC: 0 % (ref 0.0–0.2)

## 2020-10-01 MED ORDER — IOHEXOL 350 MG/ML SOLN
100.0000 mL | Freq: Once | INTRAVENOUS | Status: AC | PRN
  Administered 2020-10-01: 100 mL via INTRAVENOUS

## 2020-10-01 MED ORDER — PEG 3350-KCL-NA BICARB-NACL 420 G PO SOLR
4000.0000 mL | Freq: Once | ORAL | Status: AC
  Administered 2020-10-01: 4000 mL via ORAL
  Filled 2020-10-01: qty 4000

## 2020-10-01 NOTE — Progress Notes (Addendum)
[facility] [address]       [place] [address]             [phone]      9 Days Post-Op Procedure(s) (LRB): ESOPHAGOGASTRODUODENOSCOPY (EGD) (N/A) HEMOSTASIS CONTROL HEMOSTASIS CLIP PLACEMENT Subjective: Feeling ok, just arrived in HD  Objective: Vital signs in last 24 hours: Temp:  [97.4 F (36.3 C)-99.3 F (37.4 C)] 98.3 F (36.8 C) (08/22 0557) Pulse Rate:  [64-78] 74 (08/22 0557) Cardiac Rhythm: Normal sinus rhythm (08/21 1930) Resp:  [11-42] 18 (08/22 0126) BP: (102-155)/(51-71) 154/70 (08/22 0557) SpO2:  [97 %-100 %] 97 % (08/22 0557) Weight:  [64.9 kg] 64.9 kg (08/22 0500)  Hemodynamic parameters for last 24 hours:    Intake/Output from previous day: 08/21 0701 - 08/22 0700 In: 630 [Blood:630] Out: -  Intake/Output this shift: No intake/output data recorded.  General appearance: alert, cooperative, and no distress Heart: regular rate and rhythm and + 2/6 systolic murmur Lungs: dim in left lower fields Abdomen: soft, non-tender, non-distended Extremities: no LE edema Wound: incis healing well  Lab Results: Recent Labs    09/30/20 0854 09/30/20 1500 10/01/20 0232  WBC 6.0  --  6.3  HGB 7.5* 6.2* 8.0*  HCT 24.0* 19.8* 24.4*  PLT 187  --  156   BMET:  Recent Labs    09/28/20 0822  NA 130*  K 3.8  CL 94*  CO2 28  GLUCOSE 86  BUN 19  CREATININE 4.49*  CALCIUM 9.0    PT/INR: No results for input(s): LABPROT, INR in the last 72 hours. ABG    Component Value Date/Time   PHART 7.439 09/17/2020 1014   HCO3 29.6 (H) 09/17/2020 1014   TCO2 31 09/17/2020 1014   ACIDBASEDEF 1.0 09/13/2020 2152   O2SAT 91.0 09/17/2020 1014   CBG (last 3)  Recent Labs    09/30/20 1951  GLUCAP 106*    Meds Scheduled Meds:  sodium chloride   Intravenous Once   sodium chloride   Intravenous Once   sodium chloride   Intravenous Once   allopurinol  100 mg Oral QPM   amiodarone  200 mg Oral BID   amLODipine  10 mg Oral Daily   chlorhexidine   15 mL Mouth Rinse BID   Chlorhexidine Gluconate Cloth  6 each Topical Q0600   cholecalciferol  1,000 Units Oral QPM   cinacalcet  30 mg Oral Q M,W,F-HD   cloNIDine  0.1 mg Oral BID   darbepoetin (ARANESP) injection - DIALYSIS  100 mcg Intravenous Q Wed-HD   docusate sodium  100 mg Oral BID   feeding supplement  237 mL Oral TID BM   fluticasone  1 spray Each Nare Daily   folic acid  1 mg Oral Daily   Gerhardt's butt cream   Topical BID   hydrALAZINE  100 mg Oral Q8H   hydrocortisone  25 mg Rectal QHS   irbesartan  300 mg Oral Daily   isosorbide mononitrate  30 mg Oral Daily   labetalol  200 mg Oral TID   magnesium oxide  200 mg Oral Daily   mouth rinse  15 mL Mouth Rinse q12n4p   montelukast  10 mg Oral Q supper   multivitamin  1 tablet Oral QHS   polyvinyl alcohol  1 drop Both Eyes TID AC & HS   predniSONE  5 mg Oral Q breakfast   saccharomyces boulardii  250 mg Oral Daily   sodium chloride flush  3 mL Intravenous Q12H   umeclidinium bromide  1 puff Inhalation Daily   Continuous Infusions:  sodium chloride     sodium chloride     pantoprazole 8 mg/hr (10/01/20 0206)   PRN Meds:.sodium chloride, sodium chloride, acetaminophen, albuterol, alteplase, gabapentin, heparin, heparin, heparin, lidocaine-prilocaine, LORazepam, ondansetron (ZOFRAN) IV, oxyCODONE, pentafluoroprop-tetrafluoroeth, sodium chloride flush, sorbitol  Xrays DG Chest Port 1 View  Result Date: 09/30/2020 CLINICAL DATA:  Central line placement. EXAM: PORTABLE CHEST 1 VIEW COMPARISON:  09/30/2020 at 9:34 a.m., and older exams. FINDINGS: New left internal jugular central venous line has its catheter tip at the caval atrial junction. No pneumothorax. Persistent left lung base opacity obscures the left hemidiaphragm and portions of the left heart border consistent with a combination of pleural fluid and atelectasis, infection or a combination. Subtle hazy opacity at the right lateral lung base is consistent with a small  effusion, also stable. No new lung abnormalities. IMPRESSION: 1. Left internal jugular central venous catheter has its tip at the caval atrial junction. No pneumothorax and no other change from the earlier exam. Electronically Signed   By: Lajean Manes M.D.   On: 09/30/2020 18:02   DG CHEST PORT 1 VIEW  Result Date: 09/30/2020 CLINICAL DATA:  Shortness of breath History of PE and CHF EXAM: PORTABLE CHEST 1 VIEW COMPARISON:  05/21/2020 FINDINGS: Unchanged mild cardiomegaly. No significant pulmonary vascular congestion. Interval removal of left IJ center venous catheter. Postsurgical changes of CABG again seen. Focal airspace opacity at the left lung base is unchanged. IMPRESSION: Left basilar airspace opacity most likely a combination of atelectasis and pleural effusion. Underlying pneumonia also possible. Electronically Signed   By: Miachel Roux M.D.   On: 09/30/2020 12:16   CT ANGIO GI BLEED  Result Date: 10/01/2020 CLINICAL DATA:  Gastrointestinal hemorrhage EXAM: CTA ABDOMEN AND PELVIS WITHOUT AND WITH CONTRAST TECHNIQUE: Multidetector CT imaging of the abdomen and pelvis was performed using the standard protocol during bolus administration of intravenous contrast. Multiplanar reconstructed images and MIPs were obtained and reviewed to evaluate the vascular anatomy. CONTRAST:  158m OMNIPAQUE IOHEXOL 350 MG/ML SOLN COMPARISON:  None. FINDINGS: VASCULAR Aorta: Normal caliber. No aneurysm or dissection. Mild atherosclerotic calcification. No periaortic inflammatory change. Celiac: Unremarkable SMA: Unremarkable Renals: Single renal arteries bilaterally. No evidence of hemodynamically significant stenosis. Normal vascular morphology. No aneurysm. IMA: Greater than 50% stenosis secondary to atherosclerotic calcification at the origin. Inflow: Widely patent. No aneurysm or dissection. Moderate atherosclerotic calcification. Internal iliac arteries are patent bilaterally. Proximal Outflow: Unremarkable Veins:  Unremarkable Review of the MIP images confirms the above findings. NON-VASCULAR Lower chest: Small bilateral pleural effusions are present with compressive atelectasis of the lower lobes bilaterally. Coronary artery bypass grafting has been performed. Cardiac size is mildly enlarged. No pericardial effusion. Hepatobiliary: The gallbladder is mildly distended and demonstrates mild gallbladder wall thickening and pericholecystic fluid. This is nonspecific in the setting of anasarca, however, acute cholecystitis is not excluded. No disruption of the gallbladder wall. No intra or extrahepatic biliary ductal dilation. Liver unremarkable. Pancreas: Unremarkable Spleen: Unremarkable Adrenals/Urinary Tract: The adrenal glands are unremarkable. The kidneys are normal in size and under mildly atrophic bilaterally. Multiple simple cortical cysts are seen within the kidneys bilaterally. No hydronephrosis. No intrarenal or ureteral calculi. The bladder is decompressed and is unremarkable. Stomach/Bowel: Multiple endoscopic clips are seen within the gastric fundus. There is prominent submucosal edema within the gastric antrum possibly reflecting changes of antritis or peptic ulcer disease. No evidence of obstruction or  perforation. Scattered diverticular seen throughout the colon. The stomach, small bowel, and large bowel are otherwise unremarkable. Appendix normal. No evidence of obstruction. No free intraperitoneal gas. Mild ascites. Lymphatic: No pathologic adenopathy within the abdomen and pelvis. Reproductive: Uterus and bilateral adnexa are unremarkable. Other: No abdominal wall hernia identified. There is diffuse subcutaneous edema identified within the abdominal wall in keeping with changes of anasarca. Rectum unremarkable. Musculoskeletal: No acute bone abnormality. Lumbar fusion with instrumentation has been performed at L3-4. Erosive changes involving the inferior endplate of L2 and superior endplate of L3 with  associated mild focal kyphosis at this level is again identified and appears stable and is compatible with advanced degenerative disc disease. This is better assessed on prior MRI examination of 09/11/2020. IMPRESSION: VASCULAR No evidence of active gastrointestinal hemorrhage. No aortic aneurysm or dissection. 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 Wide patency of the celiac axis and superior mesenteric artery, however, are noted. NON-VASCULAR Mild cardiomegaly. Bilateral pleural effusions, mild ascites, and diffuse body wall subcutaneous edema in keeping with anasarca, possibly cardiogenic in nature. Gallbladder distension, gallbladder wall thickening, and trace pericholecystic fluid. This is nonspecific in the setting of cardiogenic failure, however, acute cholecystitis is not excluded. Correlation with liver enzymes would be helpful in excluding an inflammatory process. This appearance, however, is stable from prior CT examination of the chest of 09/23/2020 and similar gallbladder wall thickening was noted on CT examination the abdomen pelvis on 09/09/2020 favoring a chronic process. Multiple endoscopic clips noted within the gastric fundus. No active extravasation identified. Prominent submucosal edema involving the gastric antrum possibly related to local inflammation as can be seen with peptic ulcer disease or antritis. No evidence of obstruction or perforation. Colonic diverticulosis without superimposed acute inflammatory change. Aortic Atherosclerosis (ICD10-I70.0). Electronically Signed   By: Fidela Salisbury M.D.   On: 10/01/2020 04:16    Assessment/Plan: S/P Procedure(s) (LRB): ESOPHAGOGASTRODUODENOSCOPY (EGD) (N/A) HEMOSTASIS CONTROL HEMOSTASIS CLIP PLACEMENT   1 Tmax 99.3, VSS SBP 100's-150's, sinus rhythm on current RX for afibfeels better 2 sats  good on 1 liter-RA 3 transfused- H/H now 8.0/24.4 4 CT angio bleed results noted- GI conts  to assist with  manangement 5 nephrology conts to manage HD/renal management 6 cont pulm toilet/rehab as able, left  basilar atx effusion    LOS: 23 days    Jodi Giovanni PA-C Pager [phone] 10/01/2020    Chart reviewed, patient examined, agree with above. Recurrent GI bleeding over the weekend. CTA negative. GI planning EGD and colonoscopy tomorrow. She tolerated HD well today. -2L. Says she feels well.

## 2020-10-01 NOTE — Progress Notes (Signed)
Pt refused taking labetalol. PA notified.   Lavenia Atlas, RN

## 2020-10-01 NOTE — Progress Notes (Signed)
Pt came back to rm 9 from dialysis. Reinitiated pt on tele. VSS. Call bell within reach.   Lavenia Atlas, RN

## 2020-10-01 NOTE — Progress Notes (Signed)
Subjective: Intermitting ongoing hematochezia. CT angio negative. Some chest soreness, no abdominal pain.  Objective: Vital signs in last 24 hours: Temp:  [97.4 F (36.3 C)-99.3 F (37.4 C)] 99.3 F (37.4 C) (08/22 1133) Pulse Rate:  [64-86] 86 (08/22 1133) Resp:  [16-42] 18 (08/22 1133) BP: (102-154)/(51-86) 139/71 (08/22 1133) SpO2:  [95 %-100 %] 97 % (08/22 1133) Weight:  [63 kg-65.3 kg] 63 kg (08/22 1048) Weight change:  Last BM Date: 09/30/20  PE: GEN:  NAD  Lab Results: CBC    Component Value Date/Time   WBC 6.3 10/01/2020 0232   RBC 2.85 (L) 10/01/2020 0232   HGB 8.0 (L) 10/01/2020 0232   HCT 24.4 (L) 10/01/2020 0232   PLT 156 10/01/2020 0232   MCV 85.6 10/01/2020 0232   MCV 94.5 10/12/2012 1211   MCH 28.1 10/01/2020 0232   MCHC 32.8 10/01/2020 0232   RDW 20.9 (H) 10/01/2020 0232   LYMPHSABS 1.4 09/22/2020 0639   MONOABS 0.7 09/22/2020 0639   EOSABS 0.3 09/22/2020 0639   BASOSABS 0.0 09/22/2020 0639  CMP     Component Value Date/Time   NA 129 (L) 10/01/2020 0751   K 5.2 (H) 10/01/2020 0751   CL 96 (L) 10/01/2020 0751   CO2 26 10/01/2020 0751   GLUCOSE 91 10/01/2020 0751   BUN 27 (H) 10/01/2020 0751   CREATININE 5.11 (H) 10/01/2020 0751   CREATININE 1.34 (H) 09/18/2011 1400   CALCIUM 8.8 (L) 10/01/2020 0751   PROT 4.0 (L) 09/13/2020 1940   ALBUMIN 1.7 (L) 10/01/2020 0751   AST 16 09/13/2020 1940   ALT 9 09/13/2020 1940   ALKPHOS 45 09/13/2020 1940   BILITOT 0.5 09/13/2020 1940   GFRNONAA 9 (L) 10/01/2020 0751   GFRAA 16 (L) 11/09/2019 1152   Assessment:   Acute blood loss anemia. Blood in stool. Recurrent GI bleeding, wonder if diffuse process, unfortunately no durable response after endoscopic therapy.  Plan:   EGD and colonoscopy tomorrow. Next step pending EGD/Colon findings. [facility] will follow.   Jodi Bell 10/01/2020, 1:07 PM   Cell [phone] If no answer or after 5 PM call [phone]

## 2020-10-01 NOTE — Progress Notes (Signed)
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    #  ESRD, MWF schedule at [facility], HD on schedule. Probe weights down as able.  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 Tranfused 8/21.  Still with some GIB, GI following, CTA overnight.  No obvious source of bleeding.  Continue Darbe 100 qWed and blood transfusion as needed.   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 On sensipar, off binders.   #  Hypertension: Blood pressure acceptable.  Continue current antihypertensives and UF during HD.    # Left arm swelling: No evidence of DVT and appears to be improving with ongoing ultrafiltration with hemodialysis.  May need fistulogram as an outpatient to assess for CV stenosis.  Subjective:  Seen on HD: 2K bath, 2L UF, using AVF; AML pending CTA did not show active GI hemorrhage Hb 8.0 this AM, transfused yesterday   Objective Vital signs in last 24 hours: Vitals:   10/01/20 0126 10/01/20 0500 10/01/20 0557 10/01/20 0800  BP: 132/65  (!) 154/70 (!) 145/74  Pulse: 64  74 75  Resp: 18     Temp: 97.8 F (36.6 C)  98.3 F (36.8 C)   TempSrc: Oral  Oral   SpO2: 97%  97%   Weight:  64.9 kg    Height:       Weight change:   Intake/Output Summary (Last 24 hours) at 10/01/2020 0818 Last data filed at 10/01/2020 0126 Gross per 24 hour  Intake 630 ml  Output --  Net 630 ml        Labs: Basic Metabolic Panel: Recent Labs  Lab 09/26/20 0132 09/28/20 0822  NA 131* 130*  K 4.2 3.8  CL 96* 94*  CO2 28 28  GLUCOSE 129* 86  BUN 24* 19  CREATININE 4.73*  4.49*  CALCIUM 8.5* 9.0  PHOS  --  2.6    Liver Function Tests: Recent Labs  Lab 09/28/20 0822  ALBUMIN 1.7*    No results for input(s): LIPASE, AMYLASE in the last 168 hours. No results for input(s): AMMONIA in the last 168 hours. CBC: Recent Labs  Lab 09/26/20 0132 09/28/20 0822 09/29/20 0149 09/30/20 0854 09/30/20 1500 10/01/20 0232  WBC 7.6 7.5 6.8 6.0  --  6.3  HGB 7.5* 6.8* 8.6* 7.5* 6.2* 8.0*  HCT 22.9* 22.1* 26.5* 24.0* 19.8* 24.4*  MCV 89.1 92.9 88.0 90.9  --  85.6  PLT 85* 153 164 187  --  156    Cardiac Enzymes: No results for input(s): CKTOTAL, CKMB, CKMBINDEX, TROPONINI in the last 168 hours. CBG: Recent Labs  Lab 09/30/20 1951  GLUCAP 106*     Iron Studies: No results for input(s): IRON, TIBC, TRANSFERRIN, FERRITIN in the last 72 hours. Studies/Results: DG Chest Port 1 View  Result Date: 09/30/2020 CLINICAL DATA:  Central line placement. EXAM: PORTABLE CHEST 1 VIEW COMPARISON:  09/30/2020 at 9:34 a.m., and older exams. FINDINGS: New left internal jugular central venous line has its catheter tip at the caval atrial junction. No pneumothorax. Persistent left lung base opacity obscures the left hemidiaphragm and portions of the left heart border consistent with a combination of pleural  fluid and atelectasis, infection or a combination. Subtle hazy opacity at the right lateral lung base is consistent with a small effusion, also stable. No new lung abnormalities. IMPRESSION: 1. Left internal jugular central venous catheter has its tip at the caval atrial junction. No pneumothorax and no other change from the earlier exam. Electronically Signed   By: Lajean Manes M.D.   On: 09/30/2020 18:02   DG CHEST PORT 1 VIEW  Result Date: 09/30/2020 CLINICAL DATA:  Shortness of breath History of PE and CHF EXAM: PORTABLE CHEST 1 VIEW COMPARISON:  05/21/2020 FINDINGS: Unchanged mild cardiomegaly. No significant pulmonary vascular congestion. Interval removal of left IJ  center venous catheter. Postsurgical changes of CABG again seen. Focal airspace opacity at the left lung base is unchanged. IMPRESSION: Left basilar airspace opacity most likely a combination of atelectasis and pleural effusion. Underlying pneumonia also possible. Electronically Signed   By: Miachel Roux M.D.   On: 09/30/2020 12:16   CT ANGIO GI BLEED  Result Date: 10/01/2020 CLINICAL DATA:  Gastrointestinal hemorrhage EXAM: CTA ABDOMEN AND PELVIS WITHOUT AND WITH CONTRAST TECHNIQUE: Multidetector CT imaging of the abdomen and pelvis was performed using the standard protocol during bolus administration of intravenous contrast. Multiplanar reconstructed images and MIPs were obtained and reviewed to evaluate the vascular anatomy. CONTRAST:  128m OMNIPAQUE IOHEXOL 350 MG/ML SOLN COMPARISON:  None. FINDINGS: VASCULAR Aorta: Normal caliber. No aneurysm or dissection. Mild atherosclerotic calcification. No periaortic inflammatory change. Celiac: Unremarkable SMA: Unremarkable Renals: Single renal arteries bilaterally. No evidence of hemodynamically significant stenosis. Normal vascular morphology. No aneurysm. IMA: Greater than 50% stenosis secondary to atherosclerotic calcification at the origin. Inflow: Widely patent. No aneurysm or dissection. Moderate atherosclerotic calcification. Internal iliac arteries are patent bilaterally. Proximal Outflow: Unremarkable Veins: Unremarkable Review of the MIP images confirms the above findings. NON-VASCULAR Lower chest: Small bilateral pleural effusions are present with compressive atelectasis of the lower lobes bilaterally. Coronary artery bypass grafting has been performed. Cardiac size is mildly enlarged. No pericardial effusion. Hepatobiliary: The gallbladder is mildly distended and demonstrates mild gallbladder wall thickening and pericholecystic fluid. This is nonspecific in the setting of anasarca, however, acute cholecystitis is not excluded. No disruption of the  gallbladder wall. No intra or extrahepatic biliary ductal dilation. Liver unremarkable. Pancreas: Unremarkable Spleen: Unremarkable Adrenals/Urinary Tract: The adrenal glands are unremarkable. The kidneys are normal in size and under mildly atrophic bilaterally. Multiple simple cortical cysts are seen within the kidneys bilaterally. No hydronephrosis. No intrarenal or ureteral calculi. The bladder is decompressed and is unremarkable. Stomach/Bowel: Multiple endoscopic clips are seen within the gastric fundus. There is prominent submucosal edema within the gastric antrum possibly reflecting changes of antritis or peptic ulcer disease. No evidence of obstruction or perforation. Scattered diverticular seen throughout the colon. The stomach, small bowel, and large bowel are otherwise unremarkable. Appendix normal. No evidence of obstruction. No free intraperitoneal gas. Mild ascites. Lymphatic: No pathologic adenopathy within the abdomen and pelvis. Reproductive: Uterus and bilateral adnexa are unremarkable. Other: No abdominal wall hernia identified. There is diffuse subcutaneous edema identified within the abdominal wall in keeping with changes of anasarca. Rectum unremarkable. Musculoskeletal: No acute bone abnormality. Lumbar fusion with instrumentation has been performed at L3-4. Erosive changes involving the inferior endplate of L2 and superior endplate of L3 with associated mild focal kyphosis at this level is again identified and appears stable and is compatible with advanced degenerative disc disease. This is better assessed on prior MRI examination of 09/11/2020. IMPRESSION: VASCULAR No evidence  of active gastrointestinal hemorrhage. No aortic aneurysm or dissection. 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 Wide patency of the celiac axis and superior mesenteric artery, however, are noted. NON-VASCULAR Mild cardiomegaly. Bilateral pleural effusions, mild ascites, and diffuse  body wall subcutaneous edema in keeping with anasarca, possibly cardiogenic in nature. Gallbladder distension, gallbladder wall thickening, and trace pericholecystic fluid. This is nonspecific in the setting of cardiogenic failure, however, acute cholecystitis is not excluded. Correlation with liver enzymes would be helpful in excluding an inflammatory process. This appearance, however, is stable from prior CT examination of the chest of 09/23/2020 and similar gallbladder wall thickening was noted on CT examination the abdomen pelvis on 09/09/2020 favoring a chronic process. Multiple endoscopic clips noted within the gastric fundus. No active extravasation identified. Prominent submucosal edema involving the gastric antrum possibly related to local inflammation as can be seen with peptic ulcer disease or antritis. No evidence of obstruction or perforation. Colonic diverticulosis without superimposed acute inflammatory change. Aortic Atherosclerosis (ICD10-I70.0). Electronically Signed   By: Fidela Salisbury M.D.   On: 10/01/2020 04:16    Medications: Infusions:  sodium chloride     sodium chloride     pantoprazole 8 mg/hr (10/01/20 0206)    Scheduled Medications:  sodium chloride   Intravenous Once   sodium chloride   Intravenous Once   sodium chloride   Intravenous Once   allopurinol  100 mg Oral QPM   amiodarone  200 mg Oral BID   amLODipine  10 mg Oral Daily   chlorhexidine  15 mL Mouth Rinse BID   Chlorhexidine Gluconate Cloth  6 each Topical Q0600   cholecalciferol  1,000 Units Oral QPM   cinacalcet  30 mg Oral Q M,W,F-HD   cloNIDine  0.1 mg Oral BID   darbepoetin (ARANESP) injection - DIALYSIS  100 mcg Intravenous Q Wed-HD   docusate sodium  100 mg Oral BID   feeding supplement  237 mL Oral TID BM   fluticasone  1 spray Each Nare Daily   folic acid  1 mg Oral Daily   Gerhardt's butt cream   Topical BID   hydrALAZINE  100 mg Oral Q8H   hydrocortisone  25 mg Rectal QHS   irbesartan   300 mg Oral Daily   isosorbide mononitrate  30 mg Oral Daily   labetalol  200 mg Oral TID   magnesium oxide  200 mg Oral Daily   mouth rinse  15 mL Mouth Rinse q12n4p   montelukast  10 mg Oral Q supper   multivitamin  1 tablet Oral QHS   polyvinyl alcohol  1 drop Both Eyes TID AC & HS   predniSONE  5 mg Oral Q breakfast   saccharomyces boulardii  250 mg Oral Daily   sodium chloride flush  3 mL Intravenous Q12H   umeclidinium bromide  1 puff Inhalation Daily    have reviewed scheduled and prn medications.  Physical Exam: General:NAD, looks comfortable Heart:RRR, s1s2 nl Lungs:clear b/l, no crackle Abdomen:soft, Non-tender, non-distended Extremities:No edema Dialysis Access: Left upper extremity AV fistula has good thrill and bruit.  Bryli Mantey B Jaishon Krisher 10/01/2020,8:18 AM  LOS: 23 days

## 2020-10-01 NOTE — Progress Notes (Addendum)
Progress Note  Patient Name: Jodi Bell Date of Encounter: 10/01/2020  [facility] Cardiologist: Pixie Casino, MD   Subjective   Pt seen in HD, feeling OK. Chest soreness. She is concerned about her GI bleed.  Inpatient Medications    Scheduled Meds:  sodium chloride   Intravenous Once   sodium chloride   Intravenous Once   sodium chloride   Intravenous Once   allopurinol  100 mg Oral QPM   amiodarone  200 mg Oral BID   amLODipine  10 mg Oral Daily   chlorhexidine  15 mL Mouth Rinse BID   Chlorhexidine Gluconate Cloth  6 each Topical Q0600   cholecalciferol  1,000 Units Oral QPM   cinacalcet  30 mg Oral Q M,W,F-HD   cloNIDine  0.1 mg Oral BID   darbepoetin (ARANESP) injection - DIALYSIS  100 mcg Intravenous Q Wed-HD   docusate sodium  100 mg Oral BID   feeding supplement  237 mL Oral TID BM   fluticasone  1 spray Each Nare Daily   folic acid  1 mg Oral Daily   Gerhardt's butt cream   Topical BID   hydrALAZINE  100 mg Oral Q8H   hydrocortisone  25 mg Rectal QHS   irbesartan  300 mg Oral Daily   isosorbide mononitrate  30 mg Oral Daily   labetalol  200 mg Oral TID   magnesium oxide  200 mg Oral Daily   mouth rinse  15 mL Mouth Rinse q12n4p   montelukast  10 mg Oral Q supper   multivitamin  1 tablet Oral QHS   polyvinyl alcohol  1 drop Both Eyes TID AC & HS   predniSONE  5 mg Oral Q breakfast   saccharomyces boulardii  250 mg Oral Daily   sodium chloride flush  3 mL Intravenous Q12H   umeclidinium bromide  1 puff Inhalation Daily   Continuous Infusions:  sodium chloride     sodium chloride     pantoprazole 8 mg/hr (10/01/20 0206)   PRN Meds: sodium chloride, sodium chloride, acetaminophen, albuterol, alteplase, gabapentin, heparin, heparin, heparin, lidocaine-prilocaine, LORazepam, ondansetron (ZOFRAN) IV, oxyCODONE, pentafluoroprop-tetrafluoroeth, sodium chloride flush, sorbitol   Vital Signs    Vitals:   10/01/20 0930 10/01/20 1000 10/01/20 1030  10/01/20 1048  BP: (!) 109/55 (!) 129/59 115/67 128/63  Pulse: 81 84 84 85  Resp:    18  Temp:    98 F (36.7 C)  TempSrc:    Oral  SpO2:    97%  Weight:    63 kg  Height:        Intake/Output Summary (Last 24 hours) at 10/01/2020 1120 Last data filed at 10/01/2020 1048 Gross per 24 hour  Intake 630 ml  Output 2000 ml  Net -1370 ml   Last 3 Weights 10/01/2020 10/01/2020 10/01/2020  Weight (lbs) 138 lb 14.2 oz 143 lb 15.4 oz 143 lb  Weight (kg) 63 kg 65.3 kg 64.864 kg      Telemetry    Sinus in the 80s - Personally Reviewed  ECG    No new tracings - Personally Reviewed  Physical Exam   GEN: No acute distress.   Neck: No JVD Cardiac: RRR, no murmurs, rubs, or gallops. Sternotomy closed Respiratory: Clear to auscultation bilaterally. GI: Soft, nontender, non-distended  MS: No edema; No deformity. AV fistula Neuro:  Nonfocal  Psych: Normal affect   Labs    High Sensitivity Troponin:   Recent Labs  Lab 09/08/20  1221 09/08/20 1430 09/08/20 1800 09/09/20 0605  TROPONINIHS 40* 126* 330* 1,499*      Chemistry Recent Labs  Lab 09/26/20 0132 09/28/20 0822 10/01/20 0751  NA 131* 130* 129*  K 4.2 3.8 5.2*  CL 96* 94* 96*  CO2 '28 28 26  '$ GLUCOSE 129* 86 91  BUN 24* 19 27*  CREATININE 4.73* 4.49* 5.11*  CALCIUM 8.5* 9.0 8.8*  ALBUMIN  --  1.7* 1.7*  GFRNONAA 10* 11* 9*  ANIONGAP '7 8 7     '$ Hematology Recent Labs  Lab 09/29/20 0149 09/30/20 0854 09/30/20 1500 10/01/20 0232  WBC 6.8 6.0  --  6.3  RBC 3.01* 2.64*  --  2.85*  HGB 8.6* 7.5* 6.2* 8.0*  HCT 26.5* 24.0* 19.8* 24.4*  MCV 88.0 90.9  --  85.6  MCH 28.6 28.4  --  28.1  MCHC 32.5 31.3  --  32.8  RDW 23.2* 23.0*  --  20.9*  PLT 164 187  --  156    BNPNo results for input(s): BNP, PROBNP in the last 168 hours.   DDimer No results for input(s): DDIMER in the last 168 hours.   Radiology    DG Chest Port 1 View  Result Date: 09/30/2020 CLINICAL DATA:  Central line placement. EXAM: PORTABLE  CHEST 1 VIEW COMPARISON:  09/30/2020 at 9:34 a.m., and older exams. FINDINGS: New left internal jugular central venous line has its catheter tip at the caval atrial junction. No pneumothorax. Persistent left lung base opacity obscures the left hemidiaphragm and portions of the left heart border consistent with a combination of pleural fluid and atelectasis, infection or a combination. Subtle hazy opacity at the right lateral lung base is consistent with a small effusion, also stable. No new lung abnormalities. IMPRESSION: 1. Left internal jugular central venous catheter has its tip at the caval atrial junction. No pneumothorax and no other change from the earlier exam. Electronically Signed   By: Lajean Manes M.D.   On: 09/30/2020 18:02   DG CHEST PORT 1 VIEW  Result Date: 09/30/2020 CLINICAL DATA:  Shortness of breath History of PE and CHF EXAM: PORTABLE CHEST 1 VIEW COMPARISON:  05/21/2020 FINDINGS: Unchanged mild cardiomegaly. No significant pulmonary vascular congestion. Interval removal of left IJ center venous catheter. Postsurgical changes of CABG again seen. Focal airspace opacity at the left lung base is unchanged. IMPRESSION: Left basilar airspace opacity most likely a combination of atelectasis and pleural effusion. Underlying pneumonia also possible. Electronically Signed   By: Miachel Roux M.D.   On: 09/30/2020 12:16   CT ANGIO GI BLEED  Result Date: 10/01/2020 CLINICAL DATA:  Gastrointestinal hemorrhage EXAM: CTA ABDOMEN AND PELVIS WITHOUT AND WITH CONTRAST TECHNIQUE: Multidetector CT imaging of the abdomen and pelvis was performed using the standard protocol during bolus administration of intravenous contrast. Multiplanar reconstructed images and MIPs were obtained and reviewed to evaluate the vascular anatomy. CONTRAST:  165m OMNIPAQUE IOHEXOL 350 MG/ML SOLN COMPARISON:  None. FINDINGS: VASCULAR Aorta: Normal caliber. No aneurysm or dissection. Mild atherosclerotic calcification. No  periaortic inflammatory change. Celiac: Unremarkable SMA: Unremarkable Renals: Single renal arteries bilaterally. No evidence of hemodynamically significant stenosis. Normal vascular morphology. No aneurysm. IMA: Greater than 50% stenosis secondary to atherosclerotic calcification at the origin. Inflow: Widely patent. No aneurysm or dissection. Moderate atherosclerotic calcification. Internal iliac arteries are patent bilaterally. Proximal Outflow: Unremarkable Veins: Unremarkable Review of the MIP images confirms the above findings. NON-VASCULAR Lower chest: Small bilateral pleural effusions are present with compressive atelectasis of  the lower lobes bilaterally. Coronary artery bypass grafting has been performed. Cardiac size is mildly enlarged. No pericardial effusion. Hepatobiliary: The gallbladder is mildly distended and demonstrates mild gallbladder wall thickening and pericholecystic fluid. This is nonspecific in the setting of anasarca, however, acute cholecystitis is not excluded. No disruption of the gallbladder wall. No intra or extrahepatic biliary ductal dilation. Liver unremarkable. Pancreas: Unremarkable Spleen: Unremarkable Adrenals/Urinary Tract: The adrenal glands are unremarkable. The kidneys are normal in size and under mildly atrophic bilaterally. Multiple simple cortical cysts are seen within the kidneys bilaterally. No hydronephrosis. No intrarenal or ureteral calculi. The bladder is decompressed and is unremarkable. Stomach/Bowel: Multiple endoscopic clips are seen within the gastric fundus. There is prominent submucosal edema within the gastric antrum possibly reflecting changes of antritis or peptic ulcer disease. No evidence of obstruction or perforation. Scattered diverticular seen throughout the colon. The stomach, small bowel, and large bowel are otherwise unremarkable. Appendix normal. No evidence of obstruction. No free intraperitoneal gas. Mild ascites. Lymphatic: No pathologic  adenopathy within the abdomen and pelvis. Reproductive: Uterus and bilateral adnexa are unremarkable. Other: No abdominal wall hernia identified. There is diffuse subcutaneous edema identified within the abdominal wall in keeping with changes of anasarca. Rectum unremarkable. Musculoskeletal: No acute bone abnormality. Lumbar fusion with instrumentation has been performed at L3-4. Erosive changes involving the inferior endplate of L2 and superior endplate of L3 with associated mild focal kyphosis at this level is again identified and appears stable and is compatible with advanced degenerative disc disease. This is better assessed on prior MRI examination of 09/11/2020. IMPRESSION: VASCULAR No evidence of active gastrointestinal hemorrhage. No aortic aneurysm or dissection. 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 Wide patency of the celiac axis and superior mesenteric artery, however, are noted. NON-VASCULAR Mild cardiomegaly. Bilateral pleural effusions, mild ascites, and diffuse body wall subcutaneous edema in keeping with anasarca, possibly cardiogenic in nature. Gallbladder distension, gallbladder wall thickening, and trace pericholecystic fluid. This is nonspecific in the setting of cardiogenic failure, however, acute cholecystitis is not excluded. Correlation with liver enzymes would be helpful in excluding an inflammatory process. This appearance, however, is stable from prior CT examination of the chest of 09/23/2020 and similar gallbladder wall thickening was noted on CT examination the abdomen pelvis on 09/09/2020 favoring a chronic process. Multiple endoscopic clips noted within the gastric fundus. No active extravasation identified. Prominent submucosal edema involving the gastric antrum possibly related to local inflammation as can be seen with peptic ulcer disease or antritis. No evidence of obstruction or perforation. Colonic diverticulosis without superimposed acute  inflammatory change. Aortic Atherosclerosis (ICD10-I70.0). Electronically Signed   By: Fidela Salisbury M.D.   On: 10/01/2020 04:16    Cardiac Studies   Echo 09/09/20: 1. Left ventricular ejection fraction, by estimation, is 60%. The left  ventricle has normal function. The left ventricle are septal wall motion  abnormalities described below. There is mild concentric left ventricular  hypertrophy with severe hypetrophy  of the basal-septal segment. Left ventricular diastolic parameters are  indeterminate in the setting of significant mitral annular calcification  but suggestive of increase left atrial pressure.   2. Right ventricular systolic function is normal. The right ventricular  size is normal. There is normal pulmonary artery systolic pressure.   3. A small pericardial effusion is present. The pericardial effusion is  circumferential.   4. The mitral valve is degenerative. Mild mitral valve regurgitation.  Severe mitral annular calcification.   5. The aortic  valve is tricuspid. There is mild calcification of the  aortic valve. There is moderate thickening of the aortic valve. Aortic  valve regurgitation is not visualized. Mild aortic valve stenosis. Aortic  valve mean gradient measures 24.0 mmHg.   Aortic valve Vmax measures 3.26 m/s.   6. Left atrial size was severely dilated.   Patient Profile     60 y.o. 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 Heart cath showed multivessel disease leading to CABG, complicated by post op Afib and GI bleed.  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 Agree with APP above with the following comments: Briefly 60 yo F with CAD and recent CABG with prolonged stay related to post op AF (now in SR) and GI bleed (last noted 09/30/20) Patient notes concerns about her GI bleed and is worried that this will not be fixed. Exam notable for well healed sternotomy, active bowel sounds, no murmur but continue fistula is heard. Labs notable for Hgb post transfusion 6.2-> 8. Would recommend  - peri-discharge transition to 200 mg amiodarone PO Daily, presently has had ~ 5.2 g loaded - no AC or antiplatelets in the setting of persistent bleeding - present BP regiment appears to control BP in conjunction with HD (normotensive post HD) -peripheral follow up; will may peri-discharge final recs  Rudean Haskell, MD [place]  [address], [address] [place], [place] [address] [phone]  12:30 PM

## 2020-10-01 NOTE — Progress Notes (Signed)
[id number] Offered to walk but pt not feeling up to it right now after HD. Discussed with pt and daughter staying in the tube, sternal precautions, IS and walking as tolerated. Discussed wound care. Gave smoking cessation handout and encouraged to call 1800quitnow. Pt stated she has quit now. Pt will try to walk with staff later when feeling better. Graylon Good RN BSN 10/01/2020 1:26 PM

## 2020-10-01 NOTE — Progress Notes (Signed)
Physical Therapy Treatment Patient Details Name: ROSHUNDA LOVELESS MRN: [id number] DOB: July 14, 1960 Today's Date: 10/01/2020    History of Present Illness Pt is a 60 y.o. female admitted 09/08/20 with acute onset SOB. Workup for CHF decompensation, COPD exacerbation, HTN urgency, NSTEMI; need for emergent HD. S/p LHC 8/1 which showed severe two-vessel CAD. CABGx2 8/4. GI bleeding scan negative 08/11. PMH includes ESRD (HD MWF), HTN, DM2, COPD, OSA, psoriatic RA, PAD, anemia, tobacco abuse.    PT Comments    Pt received in supine, agreeable to therapy session with encouragement. Emphasis on sternal precautions, safe use of RW, activity pacing, importance of continued mobility. Pt limited due to fatigue. Pt continues to benefit from PT services to progress toward functional mobility goals.    Follow Up Recommendations  Home health PT;Other (comment);Supervision for mobility/OOB (pt refusing SNF)     Equipment Recommendations  3in1 (PT)    Recommendations for Other Services       Precautions / Restrictions Precautions Precautions: Fall;Sternal Precaution Booklet Issued: Yes (comment) Precaution Comments: good compliance today with transfers Restrictions Weight Bearing Restrictions: No Other Position/Activity Restrictions: sternal precautions    Mobility  Bed Mobility Overal bed mobility: Needs Assistance Bed Mobility: Supine to Sit     Supine to sit: Supervision;HOB elevated     General bed mobility comments: fair compliance with sternal precautions    Transfers Overall transfer level: Needs assistance Equipment used: Rolling walker (2 wheeled) Transfers: Sit to/from Stand Sit to Stand: Supervision         General transfer comment: from EOB to RW, pt unable to perform more than 1 rep due to fatigue  Ambulation/Gait Ambulation/Gait assistance: Min guard Gait Distance (Feet): 20 Feet Assistive device: Rolling walker (2 wheeled) Gait Pattern/deviations: Shuffle;Trunk  flexed     General Gait Details: good use of RW, pt self-limiting distance due to fatigue   Stairs             Wheelchair Mobility    Modified Rankin (Stroke Patients Only)       Balance Overall balance assessment: Needs assistance Sitting-balance support: No upper extremity supported;Feet supported Sitting balance-Leahy Scale: Fair Sitting balance - Comments: no LOB for seated therex with Supervision   Standing balance support: Bilateral upper extremity supported;During functional activity;No upper extremity supported Standing balance-Leahy Scale: Fair Standing balance comment: relies on BUE support dynamically but able to stand with U UE support of RW briefly                            Cognition Arousal/Alertness: Awake/alert Behavior During Therapy: WFL for tasks assessed/performed Overall Cognitive Status: Impaired/Different from baseline Area of Impairment: Memory;Problem solving;Awareness;Safety/judgement                   Current Attention Level: Sustained Memory: Decreased recall of precautions Following Commands: Follows one step commands consistently Safety/Judgement: Decreased awareness of deficits Awareness: Emergent Problem Solving: Slow processing;Requires verbal cues General Comments: fair compliance with sternal precautions today but pt with some decreased insight into importance of mobility/deficits, but participatory in some tasks with encouragement. Pt given HEP handout but too fatigued to perform this date.      Exercises      General Comments General comments (skin integrity, edema, etc.): VSS on RA with mobility      Pertinent Vitals/Pain Pain Assessment: Faces Faces Pain Scale: Hurts little more Pain Location: generalized, incisional Pain Descriptors / Indicators: Discomfort;Operative site guarding Pain  Intervention(s): Limited activity within patient's tolerance;Monitored during session;Premedicated before  session;Repositioned    Home Living                      Prior Function            PT Goals (current goals can now be found in the care plan section) Acute Rehab PT Goals Patient Stated Goal: return home to stay with son, she will have assist from family PT Goal Formulation: With patient Time For Goal Achievement: 10/04/20 Progress towards PT goals: Progressing toward goals    Frequency    Min 3X/week      PT Plan Current plan remains appropriate    Co-evaluation              AM-PAC PT "6 Clicks" Mobility   Outcome Measure  Help needed turning from your back to your side while in a flat bed without using bedrails?: None Help needed moving from lying on your back to sitting on the side of a flat bed without using bedrails?: A Little Help needed moving to and from a bed to a chair (including a wheelchair)?: A Little Help needed standing up from a chair using your arms (e.g., wheelchair or bedside chair)?: A Little Help needed to walk in hospital room?: A Little Help needed climbing 3-5 steps with a railing? : A Little 6 Click Score: 19    End of Session   Activity Tolerance: Patient limited by fatigue Patient left: in bed;with call bell/phone within reach;with bed alarm set (seated EOB, bed alarm set) Nurse Communication: Mobility status PT Visit Diagnosis: Other abnormalities of gait and mobility (R26.89);Muscle weakness (generalized) (M62.81);Difficulty in walking, not elsewhere classified (R26.2)     Time: [id number] PT Time Calculation (min) (ACUTE ONLY): 8 min  Charges:  $Gait Training: 8-22 mins                     Jiayi Lengacher P., PTA Acute Rehabilitation Services Pager: [phone] Office: [place] 10/01/2020, 6:12 PM

## 2020-10-01 NOTE — Progress Notes (Signed)
Consent formed obtained and placed in pt's chart.   Lavenia Atlas, RN

## 2020-10-01 NOTE — Progress Notes (Signed)
Patient refused CPAP HS tonight. Patient stated she wanted to wear her [place] tonight. Patient in no distress at this time.

## 2020-10-02 ENCOUNTER — Encounter (HOSPITAL_COMMUNITY)
Admission: EM | Disposition: A | Discharge status: Home Health | Payer: Self-pay | Source: Home / Self Care | Attending: Surgery

## 2020-10-02 ENCOUNTER — Inpatient Hospital Stay (HOSPITAL_COMMUNITY): Payer: Medicare PPO | Admitting: Certified Registered Nurse Anesthetist

## 2020-10-02 ENCOUNTER — Encounter (HOSPITAL_COMMUNITY): Payer: Self-pay | Admitting: Surgery

## 2020-10-02 HISTORY — PX: ESOPHAGOGASTRODUODENOSCOPY (EGD) WITH PROPOFOL: SHX5813

## 2020-10-02 HISTORY — PX: COLONOSCOPY WITH PROPOFOL: SHX5780

## 2020-10-02 LAB — CBC
HCT: 22.7 % — ABNORMAL LOW (ref 36.0–46.0)
Hemoglobin: 7.1 g/dL — ABNORMAL LOW (ref 12.0–15.0)
MCH: 28 pg (ref 26.0–34.0)
MCHC: 31.3 g/dL (ref 30.0–36.0)
MCV: 89.4 fL (ref 80.0–100.0)
Platelets: 175 10*3/uL (ref 150–400)
RBC: 2.54 MIL/uL — ABNORMAL LOW (ref 3.87–5.11)
RDW: 21.2 % — ABNORMAL HIGH (ref 11.5–15.5)
WBC: 5.1 10*3/uL (ref 4.0–10.5)
nRBC: 0 % (ref 0.0–0.2)

## 2020-10-02 LAB — BASIC METABOLIC PANEL
Anion gap: 7 (ref 5–15)
BUN: 15 mg/dL (ref 6–20)
CO2: 28 mmol/L (ref 22–32)
Calcium: 7.7 mg/dL — ABNORMAL LOW (ref 8.9–10.3)
Chloride: 96 mmol/L — ABNORMAL LOW (ref 98–111)
Creatinine, Ser: 3.59 mg/dL — ABNORMAL HIGH (ref 0.44–1.00)
GFR, Estimated: 14 mL/min — ABNORMAL LOW (ref >60)
Glucose, Bld: 88 mg/dL (ref 70–99)
Potassium: 3.7 mmol/L (ref 3.5–5.1)
Sodium: 131 mmol/L — ABNORMAL LOW (ref 135–145)

## 2020-10-02 SURGERY — ESOPHAGOGASTRODUODENOSCOPY (EGD) WITH PROPOFOL
Anesthesia: Monitor Anesthesia Care

## 2020-10-02 MED ORDER — PHENYLEPHRINE HCL-NACL 20-0.9 MG/250ML-% IV SOLN
INTRAVENOUS | Status: DC | PRN
  Administered 2020-10-02: 25 ug/min via INTRAVENOUS

## 2020-10-02 MED ORDER — PROPOFOL 10 MG/ML IV BOLUS
INTRAVENOUS | Status: DC | PRN
  Administered 2020-10-02: 20 mg via INTRAVENOUS

## 2020-10-02 MED ORDER — SODIUM CHLORIDE 0.9 % IV SOLN: INTRAVENOUS | Status: DC

## 2020-10-02 MED ORDER — HYDROCORTISONE ACETATE 25 MG RE SUPP
25.0000 mg | Freq: Two times a day (BID) | RECTAL | Status: DC
  Administered 2020-10-02 – 2020-10-03 (×3): 25 mg via RECTAL
  Filled 2020-10-02 (×5): qty 1

## 2020-10-02 MED ORDER — PROPOFOL 500 MG/50ML IV EMUL
INTRAVENOUS | Status: DC | PRN
  Administered 2020-10-02: 75 ug/kg/min via INTRAVENOUS

## 2020-10-02 MED ORDER — SODIUM CHLORIDE 0.9 % IV SOLN
12.5000 mg | Freq: Four times a day (QID) | INTRAVENOUS | Status: AC | PRN
  Filled 2020-10-02 (×3): qty 0.5

## 2020-10-02 SURGICAL SUPPLY — 25 items
BLOCK BITE 60FR ADLT L/F BLUE (MISCELLANEOUS) ×2 IMPLANT
ELECT REM PT RETURN 9FT ADLT (ELECTROSURGICAL) IMPLANT
ELECTRODE REM PT RTRN 9FT ADLT (ELECTROSURGICAL) IMPLANT
FCP BXJMBJMB 240X2.8X (CUTTING FORCEPS)
FLOOR PAD 36X40 (MISCELLANEOUS) ×2 IMPLANT
FORCEP RJ3 GP 1.8X160 W-NEEDLE (CUTTING FORCEPS) IMPLANT
FORCEPS BIOP RAD 4 LRG CAP 4 (CUTTING FORCEPS) IMPLANT
FORCEPS BIOP RJ4 240 W/NDL (CUTTING FORCEPS) IMPLANT
FORCEPS BXJMBJMB 240X2.8X (CUTTING FORCEPS) IMPLANT
INJECTOR/SNARE I SNARE (MISCELLANEOUS) IMPLANT
LUBRICANT JELLY 4.5OZ STERILE (MISCELLANEOUS) IMPLANT
MANIFOLD NEPTUNE II (INSTRUMENTS) IMPLANT
NDL SCLEROTHERAPY 25GX240 (NEEDLE) IMPLANT
NEEDLE SCLEROTHERAPY 25GX240 (NEEDLE) IMPLANT
PAD FLOOR 36X40 (MISCELLANEOUS) ×1 IMPLANT
PROBE APC STR FIRE (PROBE) IMPLANT
PROBE INJECTION GOLD (MISCELLANEOUS)
PROBE INJECTION GOLD 7FR (MISCELLANEOUS) IMPLANT
SNARE ROTATE MED OVAL 20MM (MISCELLANEOUS) IMPLANT
SNARE SHORT THROW 13M SML OVAL (MISCELLANEOUS) IMPLANT
SYR 50ML LL SCALE MARK (SYRINGE) IMPLANT
TRAP SPECIMEN MUCOUS 40CC (MISCELLANEOUS) IMPLANT
TUBING ENDO SMARTCAP PENTAX (MISCELLANEOUS) ×4 IMPLANT
TUBING IRRIGATION ENDOGATOR (MISCELLANEOUS) ×2 IMPLANT
WATER STERILE IRR 1000ML POUR (IV SOLUTION) IMPLANT

## 2020-10-02 NOTE — Transfer of Care (Signed)
Immediate Anesthesia Transfer of Care Note  Patient: Jodi Bell  Procedure(s) Performed: ESOPHAGOGASTRODUODENOSCOPY (EGD) WITH PROPOFOL COLONOSCOPY WITH PROPOFOL  Patient Location: PACU and Endoscopy Unit  Anesthesia Type:MAC  Level of Consciousness: awake, alert  and patient cooperative  Airway & Oxygen Therapy: Patient Spontanous Breathing and Patient connected to nasal cannula oxygen  Post-op Assessment: Report given to RN and Post -op Vital signs reviewed and stable  Post vital signs: Reviewed and stable  Last Vitals:  Vitals Value Taken Time  BP 110/50 10/02/20 1024  Temp    Pulse 60 10/02/20 1025  Resp 12 10/02/20 1025  SpO2 100 % 10/02/20 1025  Vitals shown include unvalidated device data.  Last Pain:  Vitals:   10/02/20 0848  TempSrc: Oral  PainSc: 6       Patients Stated Pain Goal: 0 (68/03/21 2248)  Complications: No notable events documented.

## 2020-10-02 NOTE — Progress Notes (Signed)
Pt came back to rm 9 from endoscopy. Reinitiated telel. VSS. Call bell within reach.   Lavenia Atlas, RN

## 2020-10-02 NOTE — Op Note (Signed)
[facility] Patient Name: Jodi Bell Procedure Date : 10/02/2020 MRN: [id number] Attending MD: Arta Silence , MD Date of Birth: 1960/06/06 CSN: [id number] Age: 60 Admit Type: Inpatient Procedure:                Colonoscopy Indications:              Hematochezia, Acute post hemorrhagic anemia Providers:                Arta Silence, MD, Jerene Pitch Person, Benetta Spar,                            Technician Referring MD:              Medicines:                Monitored Anesthesia Care Complications:            No immediate complications. Estimated Blood Loss:     Estimated blood loss: none. Estimated blood loss:                            none. Procedure:                Pre-Anesthesia Assessment:                           - Prior to the procedure, a History and Physical                            was performed, and patient medications and                            allergies were reviewed. The patient's tolerance of                            previous anesthesia was also reviewed. The risks                            and benefits of the procedure and the sedation                            options and risks were discussed with the patient.                            All questions were answered, and informed consent                            was obtained. Prior Anticoagulants: The patient has                            taken heparin, last dose was 1 day prior to                            procedure. ASA Grade Assessment: IV - A patient                            with  severe systemic disease that is a constant                            threat to life. After reviewing the risks and                            benefits, the patient was deemed in satisfactory                            condition to undergo the procedure.                           After obtaining informed consent, the colonoscope                            was passed under direct vision. Throughout the                             procedure, the patient's blood pressure, pulse, and                            oxygen saturations were monitored continuously. The                            PCF-190TL [id number] Olympus colonoscope was                            introduced through the anus and advanced to the the                            cecum, identified by appendiceal orifice and                            ileocecal valve. After obtaining informed consent,                            the colonoscope was passed under direct vision.                            Throughout the procedure, the patient's blood                            pressure, pulse, and oxygen saturations were                            monitored continuously.The colonoscopy was                            performed without difficulty. The patient tolerated                            the procedure well. The quality of the bowel  preparation was good. Scope In: 9:59:25 AM Scope Out: 10:15:02 AM Scope Withdrawal Time: 0 hours 6 minutes 42 seconds  Total Procedure Duration: 0 hours 15 minutes 37 seconds  Findings:      The perianal and digital rectal examinations were normal.      Moderate ulceration/erythema over internal hemorrhoids were found during       retroflexion.      No additional abnormalities were found on retroflexion.      Many small and large-mouthed diverticula were found in the sigmoid       colon, descending colon, transverse colon and ascending colon.      No old or fresh blood was seen to the extent of our examination.      Colon otherwise normal; no other polyps, masses, vascular ectasias, or       inflammatory changes were seen. Impression:               - Moderate ulceration/erythema over internal                            hemorrhoids. Suspect this is cause of recent                            hematochezia.                           - Diverticulosis in the sigmoid colon, in the                             descending colon, in the transverse colon and in                            the ascending colon.                           - No active or old blood seen at this time either                            on endoscopy or colonoscopy.                           - The examination was otherwise normal. Recommendation:           - Return patient to hospital ward for ongoing care.                           - Full liquid diet today.                           - Topical therapies (e.g., Preparation-H),                            avoidance of constipation/straining, liberal water                            intake, for management of hemorrhoids.                           -  [facility] will follow. Procedure Code(s):        --- Professional ---                           [phone], Colonoscopy, flexible; diagnostic, including                            collection of specimen(s) by brushing or washing,                            when performed (separate procedure) Diagnosis Code(s):        --- Professional ---                           K92.1, Melena (includes Hematochezia)                           D62, Acute posthemorrhagic anemia                           K57.30, Diverticulosis of large intestine without                            perforation or abscess without bleeding CPT copyright 2019 [employer]. All rights reserved. The codes documented in this report are preliminary and upon coder review may  be revised to meet current compliance requirements. Arta Silence, MD 10/02/2020 10:33:29 AM This report has been signed electronically. Number of Addenda: 0

## 2020-10-02 NOTE — Progress Notes (Addendum)
[address]       [place] [address]             [phone]      10 Days Post-Op Procedure(s) (LRB): ESOPHAGOGASTRODUODENOSCOPY (EGD) (N/A) HEMOSTASIS CONTROL HEMOSTASIS CLIP PLACEMENT Subjective: GI plans as noted for colonoscopy/EGD today Feels well, no further bloody stools  Objective: Vital signs in last 24 hours: Temp:  [97.6 F (36.4 C)-99.3 F (37.4 C)] 97.9 F (36.6 C) (08/23 0324) Pulse Rate:  [67-86] 68 (08/23 0324) Cardiac Rhythm: Normal sinus rhythm (08/22 1916) Resp:  [12-19] 19 (08/23 0324) BP: (107-154)/(55-86) 139/69 (08/23 0324) SpO2:  [95 %-100 %] 100 % (08/23 0324) Weight:  [63 kg-66.2 kg] 66.2 kg (08/23 0324)  Hemodynamic parameters for last 24 hours:    Intake/Output from previous day: 08/22 0701 - 08/23 0700 In: 2192.3 [P.O.:1960; I.V.:232.3] Out: 2000  Intake/Output this shift: No intake/output data recorded.  General appearance: alert, cooperative, and no distress Heart: regular rate and rhythm Lungs: dim in left lower fields Abdomen: soft, non tender or distended Extremities: no LE edema Wound: incis healing well  Lab Results: Recent Labs    09/30/20 0854 09/30/20 1500 10/01/20 0232  WBC 6.0  --  6.3  HGB 7.5* 6.2* 8.0*  HCT 24.0* 19.8* 24.4*  PLT 187  --  156   BMET:  Recent Labs    10/01/20 0751  NA 129*  K 5.2*  CL 96*  CO2 26  GLUCOSE 91  BUN 27*  CREATININE 5.11*  CALCIUM 8.8*    PT/INR: No results for input(s): LABPROT, INR in the last 72 hours. ABG    Component Value Date/Time   PHART 7.439 09/17/2020 1014   HCO3 29.6 (H) 09/17/2020 1014   TCO2 31 09/17/2020 1014   ACIDBASEDEF 1.0 09/13/2020 2152   O2SAT 91.0 09/17/2020 1014   CBG (last 3)  Recent Labs    09/30/20 1951  GLUCAP 106*    Meds Scheduled Meds:  sodium chloride   Intravenous Once   sodium chloride   Intravenous Once   sodium chloride   Intravenous Once   allopurinol  100 mg Oral QPM   amiodarone  200 mg Oral  BID   amLODipine  10 mg Oral Daily   chlorhexidine  15 mL Mouth Rinse BID   Chlorhexidine Gluconate Cloth  6 each Topical Q0600   cholecalciferol  1,000 Units Oral QPM   cinacalcet  30 mg Oral Q M,W,F-HD   cloNIDine  0.1 mg Oral BID   darbepoetin (ARANESP) injection - DIALYSIS  100 mcg Intravenous Q Wed-HD   docusate sodium  100 mg Oral BID   feeding supplement  237 mL Oral TID BM   fluticasone  1 spray Each Nare Daily   folic acid  1 mg Oral Daily   Gerhardt's butt cream   Topical BID   hydrALAZINE  100 mg Oral Q8H   hydrocortisone  25 mg Rectal QHS   irbesartan  300 mg Oral Daily   isosorbide mononitrate  30 mg Oral Daily   labetalol  200 mg Oral TID   magnesium oxide  200 mg Oral Daily   mouth rinse  15 mL Mouth Rinse q12n4p   montelukast  10 mg Oral Q supper   multivitamin  1 tablet Oral QHS   polyvinyl alcohol  1 drop Both Eyes TID AC & HS   predniSONE  5 mg Oral Q breakfast   saccharomyces boulardii  250 mg Oral  Daily   sodium chloride flush  3 mL Intravenous Q12H   umeclidinium bromide  1 puff Inhalation Daily   Continuous Infusions:  sodium chloride     sodium chloride     pantoprazole 8 mg/hr (10/02/20 0520)   PRN Meds:.sodium chloride, sodium chloride, acetaminophen, albuterol, alteplase, gabapentin, heparin, heparin, heparin, lidocaine-prilocaine, LORazepam, ondansetron (ZOFRAN) IV, oxyCODONE, pentafluoroprop-tetrafluoroeth, sodium chloride flush, sorbitol  Xrays DG Chest Port 1 View  Result Date: 09/30/2020 CLINICAL DATA:  Central line placement. EXAM: PORTABLE CHEST 1 VIEW COMPARISON:  09/30/2020 at 9:34 a.m., and older exams. FINDINGS: New left internal jugular central venous line has its catheter tip at the caval atrial junction. No pneumothorax. Persistent left lung base opacity obscures the left hemidiaphragm and portions of the left heart border consistent with a combination of pleural fluid and atelectasis, infection or a combination. Subtle hazy opacity at  the right lateral lung base is consistent with a small effusion, also stable. No new lung abnormalities. IMPRESSION: 1. Left internal jugular central venous catheter has its tip at the caval atrial junction. No pneumothorax and no other change from the earlier exam. Electronically Signed   By: Lajean Manes M.D.   On: 09/30/2020 18:02   DG CHEST PORT 1 VIEW  Result Date: 09/30/2020 CLINICAL DATA:  Shortness of breath History of PE and CHF EXAM: PORTABLE CHEST 1 VIEW COMPARISON:  05/21/2020 FINDINGS: Unchanged mild cardiomegaly. No significant pulmonary vascular congestion. Interval removal of left IJ center venous catheter. Postsurgical changes of CABG again seen. Focal airspace opacity at the left lung base is unchanged. IMPRESSION: Left basilar airspace opacity most likely a combination of atelectasis and pleural effusion. Underlying pneumonia also possible. Electronically Signed   By: Miachel Roux M.D.   On: 09/30/2020 12:16   CT ANGIO GI BLEED  Result Date: 10/01/2020 CLINICAL DATA:  Gastrointestinal hemorrhage EXAM: CTA ABDOMEN AND PELVIS WITHOUT AND WITH CONTRAST TECHNIQUE: Multidetector CT imaging of the abdomen and pelvis was performed using the standard protocol during bolus administration of intravenous contrast. Multiplanar reconstructed images and MIPs were obtained and reviewed to evaluate the vascular anatomy. CONTRAST:  152m OMNIPAQUE IOHEXOL 350 MG/ML SOLN COMPARISON:  None. FINDINGS: VASCULAR Aorta: Normal caliber. No aneurysm or dissection. Mild atherosclerotic calcification. No periaortic inflammatory change. Celiac: Unremarkable SMA: Unremarkable Renals: Single renal arteries bilaterally. No evidence of hemodynamically significant stenosis. Normal vascular morphology. No aneurysm. IMA: Greater than 50% stenosis secondary to atherosclerotic calcification at the origin. Inflow: Widely patent. No aneurysm or dissection. Moderate atherosclerotic calcification. Internal iliac arteries are  patent bilaterally. Proximal Outflow: Unremarkable Veins: Unremarkable Review of the MIP images confirms the above findings. NON-VASCULAR Lower chest: Small bilateral pleural effusions are present with compressive atelectasis of the lower lobes bilaterally. Coronary artery bypass grafting has been performed. Cardiac size is mildly enlarged. No pericardial effusion. Hepatobiliary: The gallbladder is mildly distended and demonstrates mild gallbladder wall thickening and pericholecystic fluid. This is nonspecific in the setting of anasarca, however, acute cholecystitis is not excluded. No disruption of the gallbladder wall. No intra or extrahepatic biliary ductal dilation. Liver unremarkable. Pancreas: Unremarkable Spleen: Unremarkable Adrenals/Urinary Tract: The adrenal glands are unremarkable. The kidneys are normal in size and under mildly atrophic bilaterally. Multiple simple cortical cysts are seen within the kidneys bilaterally. No hydronephrosis. No intrarenal or ureteral calculi. The bladder is decompressed and is unremarkable. Stomach/Bowel: Multiple endoscopic clips are seen within the gastric fundus. There is prominent submucosal edema within the gastric antrum possibly reflecting changes of antritis or peptic  ulcer disease. No evidence of obstruction or perforation. Scattered diverticular seen throughout the colon. The stomach, small bowel, and large bowel are otherwise unremarkable. Appendix normal. No evidence of obstruction. No free intraperitoneal gas. Mild ascites. Lymphatic: No pathologic adenopathy within the abdomen and pelvis. Reproductive: Uterus and bilateral adnexa are unremarkable. Other: No abdominal wall hernia identified. There is diffuse subcutaneous edema identified within the abdominal wall in keeping with changes of anasarca. Rectum unremarkable. Musculoskeletal: No acute bone abnormality. Lumbar fusion with instrumentation has been performed at L3-4. Erosive changes involving the  inferior endplate of L2 and superior endplate of L3 with associated mild focal kyphosis at this level is again identified and appears stable and is compatible with advanced degenerative disc disease. This is better assessed on prior MRI examination of 09/11/2020. IMPRESSION: VASCULAR No evidence of active gastrointestinal hemorrhage. No aortic aneurysm or dissection. 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 Wide patency of the celiac axis and superior mesenteric artery, however, are noted. NON-VASCULAR Mild cardiomegaly. Bilateral pleural effusions, mild ascites, and diffuse body wall subcutaneous edema in keeping with anasarca, possibly cardiogenic in nature. Gallbladder distension, gallbladder wall thickening, and trace pericholecystic fluid. This is nonspecific in the setting of cardiogenic failure, however, acute cholecystitis is not excluded. Correlation with liver enzymes would be helpful in excluding an inflammatory process. This appearance, however, is stable from prior CT examination of the chest of 09/23/2020 and similar gallbladder wall thickening was noted on CT examination the abdomen pelvis on 09/09/2020 favoring a chronic process. Multiple endoscopic clips noted within the gastric fundus. No active extravasation identified. Prominent submucosal edema involving the gastric antrum possibly related to local inflammation as can be seen with peptic ulcer disease or antritis. No evidence of obstruction or perforation. Colonic diverticulosis without superimposed acute inflammatory change. Aortic Atherosclerosis (ICD10-I70.0). Electronically Signed   By: Fidela Salisbury M.D.   On: 10/01/2020 04:16    Assessment/Plan: S/P Procedure(s) (LRB): ESOPHAGOGASTRODUODENOSCOPY (EGD) (N/A) HEMOSTASIS CONTROL HEMOSTASIS CLIP PLACEMENT  1 Tmax 99.3, VSS stable SBP somewhat variable at times but mostly well controlled 2 sats good on 0-2 liters 3 am labs pending 4 EGD/Colonoscopy  today 5 cont nephrology management of HD/renal issues 6 push rehab and pulm toilet as able    LOS: 24 days    [name] PA-C Pager [phone] 10/02/2020    Chart reviewed, patient examined, agree with above. EGD negative for bleeding. Colonoscopy showed inflamed and eroded hemorrhoids which may be cause of bleeding. She has diffuse colonic diverticuli but not sign of bleeding from them.

## 2020-10-02 NOTE — Progress Notes (Signed)
[id number] Came to see pt to offer walk. Has worked with PT and OT today and has had tests earlier. Stated right breast having discomfort and RN aware. Not up to walking at this time. Will continue to follow. Graylon Good RN BSN 10/02/2020 2:58 PM

## 2020-10-02 NOTE — Progress Notes (Signed)
Occupational Therapy Treatment Patient Details Name: Jodi Bell MRN: [id number] DOB: 04-Oct-1960 Today's Date: 10/02/2020    History of present illness Pt is a 60 y.o. female admitted 09/08/20 with acute onset SOB. Workup for CHF decompensation, COPD exacerbation, HTN urgency, NSTEMI; need for emergent HD. S/p LHC 8/1 which showed severe two-vessel CAD. CABGx2 8/4. GI bleeding scan negative 08/11. PMH includes ESRD (HD MWF), HTN, DM2, COPD, OSA, psoriatic RA, PAD, anemia, tobacco abuse.   OT comments  Patient seated in recliner and agreeable to OT with encouragement. Requires min cueing to sternal precaution adherence functionally, supervision for transfers and in room mobility using RW.  Limited activity tolerance and fatigues easily.  Daughter present and supportive, reviewed precaution importance and safety.  Will follow acutely.    Follow Up Recommendations  Home health OT;Supervision/Assistance - 24 hour    Equipment Recommendations  3 in 1 bedside commode    Recommendations for Other Services      Precautions / Restrictions Precautions Precautions: Fall;Sternal Precaution Booklet Issued: Yes (comment) Precaution Comments: min cueing for precautions Restrictions Other Position/Activity Restrictions: sternal precautions       Mobility Bed Mobility Overal bed mobility: Needs Assistance Bed Mobility: Supine to Sit     Supine to sit: Supervision;HOB elevated     General bed mobility comments: OOB in recliner    Transfers Overall transfer level: Needs assistance Equipment used: Rolling walker (2 wheeled) Transfers: Sit to/from Stand Sit to Stand: Supervision         General transfer comment: supervision for safety, cueing for hand placement on knees    Balance Overall balance assessment: Needs assistance Sitting-balance support: No upper extremity supported;Feet supported Sitting balance-Leahy Scale: Good Sitting balance - Comments: no LOB for seated therex  with Supervision   Standing balance support: Bilateral upper extremity supported;No upper extremity supported;During functional activity Standing balance-Leahy Scale: Fair Standing balance comment: B UE support dynamcially                           ADL either performed or assessed with clinical judgement   ADL Overall ADL's : Needs assistance/impaired     Grooming: Oral care;Supervision/safety;Standing               Lower Body Dressing: Supervision/safety;Sit to/from stand   Toilet Transfer: Supervision/safety;Ambulation;RW           Functional mobility during ADLs: Supervision/safety;Rolling walker General ADL Comments: patient limited by decreased activity tolerance, cueing required for precautions functionally.     Vision       Perception     Praxis      Cognition Arousal/Alertness: Awake/alert Behavior During Therapy: WFL for tasks assessed/performed Overall Cognitive Status: Impaired/Different from baseline Area of Impairment: Safety/judgement;Awareness;Problem solving                   Current Attention Level: Sustained Memory: Decreased recall of precautions Following Commands: Follows one step commands consistently Safety/Judgement: Decreased awareness of safety;Decreased awareness of deficits Awareness: Emergent Problem Solving: Slow processing;Requires verbal cues General Comments: pt continues to require cueing for sternal precuations functionally, decreased problem sovling and awarness noted        Exercises     Shoulder Instructions       General Comments VSS on RA; pt attempting func mobility inially wtihout RW but improved stability and independence using RW    Pertinent Vitals/ Pain       Pain Assessment: Faces Faces Pain Scale:  Hurts little more Pain Location: generalized, incisional Pain Descriptors / Indicators: Discomfort;Operative site guarding Pain Intervention(s): Limited activity within patient's  tolerance;Monitored during session;Repositioned  Home Living                                          Prior Functioning/Environment              Frequency  Min 2X/week        Progress Toward Goals  OT Goals(current goals can now be found in the care plan section)  Progress towards OT goals: Progressing toward goals  Acute Rehab OT Goals Patient Stated Goal: return home to stay with son, she will have assist from family OT Goal Formulation: With patient  Plan Discharge plan remains appropriate;Frequency remains appropriate    Co-evaluation                 AM-PAC OT "6 Clicks" Daily Activity     Outcome Measure   Help from another person eating meals?: None Help from another person taking care of personal grooming?: A Little Help from another person toileting, which includes using toliet, bedpan, or urinal?: A Little Help from another person bathing (including washing, rinsing, drying)?: A Little Help from another person to put on and taking off regular upper body clothing?: A Little Help from another person to put on and taking off regular lower body clothing?: A Little 6 Click Score: 19    End of Session Equipment Utilized During Treatment: Rolling walker  OT Visit Diagnosis: Unsteadiness on feet (R26.81);Other abnormalities of gait and mobility (R26.89);Muscle weakness (generalized) (M62.81);Other symptoms and signs involving cognitive function;Pain   Activity Tolerance Patient tolerated treatment well   Patient Left in chair;with call bell/phone within reach;with chair alarm set;with family/visitor present   Nurse Communication Mobility status        Time: [id number] OT Time Calculation (min): 14 min  Charges: OT General Charges $OT Visit: 1 Visit OT Treatments $Self Care/Home Management : 8-22 mins  Jolaine Artist, [place] Pager [phone] Office [phone]'    Delight Stare 10/02/2020, 1:52  PM

## 2020-10-02 NOTE — Anesthesia Postprocedure Evaluation (Signed)
Anesthesia Post Note  Patient: Jodi Bell  Procedure(s) Performed: ESOPHAGOGASTRODUODENOSCOPY (EGD) WITH PROPOFOL COLONOSCOPY WITH PROPOFOL     Patient location during evaluation: Endoscopy Anesthesia Type: MAC Level of consciousness: awake Pain management: pain level controlled Vital Signs Assessment: post-procedure vital signs reviewed and stable Respiratory status: spontaneous breathing Cardiovascular status: stable Postop Assessment: no apparent nausea or vomiting Anesthetic complications: no   No notable events documented.  Last Vitals:  Vitals:   10/02/20 1057 10/02/20 1100  BP: 111/64   Pulse: 63   Resp: 13   Temp: (!) 36.4 C   SpO2:  100%    Last Pain:  Vitals:   10/02/20 1057  TempSrc: Oral  PainSc:                  Ioan Landini

## 2020-10-02 NOTE — Progress Notes (Signed)
PT Cancellation Note  Patient Details Name: Jodi Bell MRN: [id number] DOB: November 23, 1960   Cancelled Treatment:    Reason Eval/Treat Not Completed: (P) Patient at procedure or test/unavailable Pt off floor for a procedure. PT will follow back this afternoon as able.  Coleson Kant B. Migdalia Dk PT, DPT Acute Rehabilitation Services Pager [phone] Office [phone]    [place] 10/02/2020, 9:30 AM

## 2020-10-02 NOTE — Progress Notes (Signed)
Physical Therapy Treatment Patient Details Name: Jodi Bell MRN: [id number] DOB: 25-Apr-1960 Today's Date: 10/02/2020    History of Present Illness Pt is a 60 y.o. female admitted 09/08/20 with acute onset SOB. Workup for CHF decompensation, COPD exacerbation, HTN urgency, NSTEMI; need for emergent HD. S/p LHC 8/1 which showed severe two-vessel CAD. CABGx2 8/4. GI bleeding scan negative 08/11. PMH includes ESRD (HD MWF), HTN, DM2, COPD, OSA, psoriatic RA, PAD, anemia, tobacco abuse.    PT Comments    Pt recently returned from EGD, reports she "is still a little groggy". PT provided increased encouragement to get out of bed to for lunch. Pt agreeable but does not progress mobility more than to walk around the bed to the recliner. Pt is supervision for bed mobility, and transfers and min guard for ambulation with RW. D/c plan remains appropriate at this time. PT will continue to follow acutely.     Follow Up Recommendations  Home health PT;Other (comment);Supervision for mobility/OOB (pt refusing SNF)     Equipment Recommendations  3in1 (PT)       Precautions / Restrictions Precautions Precautions: Fall;Sternal Precaution Booklet Issued: Yes (comment) Precaution Comments: good compliance today with transfers Restrictions Weight Bearing Restrictions: No Other Position/Activity Restrictions: sternal precautions    Mobility  Bed Mobility Overal bed mobility: Needs Assistance Bed Mobility: Supine to Sit     Supine to sit: Supervision;HOB elevated     General bed mobility comments: fair compliance with sternal precautions    Transfers Overall transfer level: Needs assistance Equipment used: Rolling walker (2 wheeled) Transfers: Sit to/from Stand Sit to Stand: Supervision         General transfer comment: from EOB to RW, pt unable to perform more than 1 rep due to fatigue  Ambulation/Gait Ambulation/Gait assistance: Min guard   Assistive device: Rolling walker (2  wheeled) Gait Pattern/deviations: Shuffle;Trunk flexed     General Gait Details: good use of RW, pt self-limiting distance due to fatigue          Balance Overall balance assessment: Needs assistance Sitting-balance support: No upper extremity supported;Feet supported Sitting balance-Leahy Scale: Fair Sitting balance - Comments: no LOB for seated therex with Supervision   Standing balance support: Bilateral upper extremity supported;During functional activity;No upper extremity supported Standing balance-Leahy Scale: Fair Standing balance comment: relies on BUE support dynamically but able to stand with U UE support of RW briefly                            Cognition Arousal/Alertness: Awake/alert Behavior During Therapy: WFL for tasks assessed/performed Overall Cognitive Status: Impaired/Different from baseline Area of Impairment: Memory;Problem solving;Awareness;Safety/judgement                   Current Attention Level: Sustained Memory: Decreased recall of precautions Following Commands: Follows one step commands consistently Safety/Judgement: Decreased awareness of deficits Awareness: Emergent Problem Solving: Slow processing;Requires verbal cues General Comments: continues to require increased encouragement for mobility, and reminders for adherence to sternal precaution         General Comments   VSS on RA     Pertinent Vitals/Pain Pain Assessment: Faces Faces Pain Scale: Hurts little more Pain Location: generalized, incisional Pain Descriptors / Indicators: Discomfort;Operative site guarding Pain Intervention(s): Limited activity within patient's tolerance;Monitored during session;Repositioned     PT Goals (current goals can now be found in the care plan section) Acute Rehab PT Goals Patient Stated Goal: return home  to stay with son, she will have assist from family PT Goal Formulation: With patient Time For Goal Achievement: 10/04/20     Frequency    Min 3X/week      PT Plan Current plan remains appropriate       AM-PAC PT "6 Clicks" Mobility   Outcome Measure  Help needed turning from your back to your side while in a flat bed without using bedrails?: None Help needed moving from lying on your back to sitting on the side of a flat bed without using bedrails?: A Little Help needed moving to and from a bed to a chair (including a wheelchair)?: A Little Help needed standing up from a chair using your arms (e.g., wheelchair or bedside chair)?: A Little Help needed to walk in hospital room?: A Little Help needed climbing 3-5 steps with a railing? : A Little 6 Click Score: 19    End of Session   Activity Tolerance: Patient limited by fatigue Patient left: in bed;with call bell/phone within reach;with bed alarm set (seated EOB, bed alarm set) Nurse Communication: Mobility status PT Visit Diagnosis: Other abnormalities of gait and mobility (R26.89);Muscle weakness (generalized) (M62.81);Difficulty in walking, not elsewhere classified (R26.2)     Time: [id number] PT Time Calculation (min) (ACUTE ONLY): 11 min  Charges:  $Therapeutic Activity: 8-22 mins                     Sam Overbeck B. Migdalia Dk PT, DPT Acute Rehabilitation Services Pager [phone] Office [phone]    Belleair Shore 10/02/2020, 12:02 PM

## 2020-10-02 NOTE — Op Note (Signed)
[facility] Patient Name: Jodi Bell Procedure Date : 10/02/2020 MRN: [id number] Attending MD: Arta Silence , MD Date of Birth: 15-Jan-1961 CSN: [id number] Age: 60 Admit Type: Inpatient Procedure:                Upper GI endoscopy Indications:              Acute post hemorrhagic anemia, Hematochezia, Melena Providers:                Arta Silence, MD, Jerene Pitch Person, Benetta Spar,                            Technician Referring MD:              Medicines:                Monitored Anesthesia Care Complications:            No immediate complications. Estimated Blood Loss:     Estimated blood loss: none. Procedure:                Pre-Anesthesia Assessment:                           - Prior to the procedure, a History and Physical                            was performed, and patient medications and                            allergies were reviewed. The patient's tolerance of                            previous anesthesia was also reviewed. The risks                            and benefits of the procedure and the sedation                            options and risks were discussed with the patient.                            All questions were answered, and informed consent                            was obtained. Prior Anticoagulants: The patient has                            taken heparin, last dose was 1 day prior to                            procedure. ASA Grade Assessment: IV - A patient                            with severe systemic disease that is a constant  threat to life. After reviewing the risks and                            benefits, the patient was deemed in satisfactory                            condition to undergo the procedure.                           After obtaining informed consent, the endoscope was                            passed under direct vision. Throughout the                            procedure, the  patient's blood pressure, pulse, and                            oxygen saturations were monitored continuously. The                            [id number] Olympus endoscope was introduced                            through the mouth, and advanced to the second part                            of duodenum. The upper GI endoscopy was                            accomplished without difficulty. The patient                            tolerated the procedure well. Scope In: Scope Out: Findings:      The examined esophagus was normal.      3 old hemoclips from prior recent endoscopy noted.      The exam of the stomach was otherwise normal.      The duodenal bulb, first portion of the duodenum and second portion of       the duodenum were normal. Impression:               - Normal esophagus.                           - Old hemoclips in stomach, otherwise normal                            stomach.                           - Normal duodenal bulb, first portion of the                            duodenum and second portion of the duodenum.                           -  No source of bleeding seen today. Recommendation:           - Perform a colonoscopy today. Procedure Code(s):        --- Professional ---                           [phone], Esophagogastroduodenoscopy, flexible,                            transoral; diagnostic, including collection of                            specimen(s) by brushing or washing, when performed                            (separate procedure) Diagnosis Code(s):        --- Professional ---                           D62, Acute posthemorrhagic anemia                           K92.1, Melena (includes Hematochezia) CPT copyright 2019 American Medical Association. All rights reserved. The codes documented in this report are preliminary and upon coder review may  be revised to meet current compliance requirements. Arta Silence, MD 10/02/2020 [id number] AM This report  has been signed electronically. Number of Addenda: 0

## 2020-10-02 NOTE — Progress Notes (Signed)
Nutrition follow Up  DOCUMENTATION CODES:   Non-severe (moderate) malnutrition in context of chronic illness  INTERVENTION:   Continue Ensure Enlive po TID, each supplement provides 350 kcal and 20 grams of protein [employer] at [employer] Continue Renal MVI daily  Recommend NO DIET RESTRICTIONS (other than consistency) given pt with malnutrition and poor appetite with poor po intake  NUTRITION DIAGNOSIS:   Moderate Malnutrition related to chronic illness as evidenced by mild fat depletion, moderate fat depletion, moderate muscle depletion, severe muscle depletion.  Ongoing   GOAL:   Patient will meet greater than or equal to 90% of their needs  Progressing   MONITOR:   PO intake, Supplement acceptance, Labs, Weight trends  REASON FOR ASSESSMENT:   Rounds, LOS    ASSESSMENT:   60 yo female admitted with NSTEMI and required CABG. PMH includes ESRD on HD, HTN, DM, COPD, PAD  8/04 CABG x 2 8/13 s/p EGD showed several discrete bleeding lesions in stomach, EPI + clips 8/14 diet advanced to SOFT  Having intermittent hematochezia. Underwent EGD/colonoscopy which showed no source of bleeding. Patient reports appetite fluctuates but has been stable over the last few days. Has been on a liquid diet since yesterday and ready to have regular textured foods. Taking Ensure 2-3 times per day and wound like to continue.   EDW: 64 kg  Current weight: 66.2 kg   Medications: sensipar, aranesp, colace, folic acid, mag-ox, prednisone, protonix  Labs: Na 131 (L) Phosphorus 4.9 (wdl for HD)  Diet Order:   Diet Order             Diet full liquid Room service appropriate? Yes; Fluid consistency: Thin  Diet effective now                   EDUCATION NEEDS:   Education needs have been addressed  Skin:  Skin Assessment: Skin Integrity Issues: Skin Integrity Issues:: Stage II Stage II: buttocks  Last BM:  8/23  Height:   Ht Readings from Last 1 Encounters:   10/02/20 '5\' 2"'$  (1.575 m)    Weight:   Wt Readings from Last 1 Encounters:  10/02/20 66.2 kg     BMI:  Body mass index is 26.69 kg/m.  Estimated Nutritional Needs:   Kcal:  1800-2100 kcals  Protein:  90-110 g  Fluid:  1000 mL plus UOP  Tabitha Tupper MS, RD, LDN, CNSC Clinical Nutrition Pager listed in [place]

## 2020-10-02 NOTE — Progress Notes (Signed)
[facility] NEPHROLOGY PROGRESS NOTE  Assessment/ Plan:  # Non-ST elevation MI status post two-vessel CABG: Presented with volume overload/acute hypoxic respiratory failure and underwent two-vessel CABG on 09/13/2020.    #  ESRD, MWF schedule at [facility], HD on schedule. Probe weights down as able.  No heparin with HD. 2K. 3.5h, AVF  #  Acute blood loss anemia: EGD previously done showed dieulafoy lesions with an ulcer at the gastroesophageal junction with visible vessel treated with hemostatic clip. Tranfused 8/21.  Still with some GIB, GI following, CTA overnight.  No obvious source of bleeding.  Continue Darbe 100 qWed and blood transfusion as needed.   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 On sensipar, off binders.   #  Hypertension: Blood pressure acceptable.  Continue current antihypertensives and UF during HD.    # Left arm swelling: No evidence of DVT and appears to be improving with ongoing ultrafiltration with hemodialysis.  May need fistulogram as an outpatient to assess for CV stenosis.    Subjective:  EGD colonoscopy today to evaluate for further GI bleeding, no current cause of bleeding identified, ulcerated hemorrhoid noted Hemoglobin decreased from 8->7.1 in past 24 hours Afebrile, vital signs stable, on room air  Objective Vital signs in last 24 hours: Vitals:   10/02/20 1033 10/02/20 1044 10/02/20 1057 10/02/20 1100  BP: (!) 103/47 103/70 111/64   Pulse: 63 60 63   Resp: '14 20 13   '$ Temp:   (!) 97.5 F (36.4 C)   TempSrc:   Oral   SpO2: 99% 95%  100%  Weight:      Height:       Weight change: 0.436 kg  Intake/Output Summary (Last 24 hours) at 10/02/2020 1234 Last data filed at 10/02/2020 1010 Gross per 24 hour  Intake 2592.32 ml  Output --  Net 2592.32 ml        Labs: Basic Metabolic Panel: Recent Labs  Lab 09/28/20 0822  10/01/20 0751 10/02/20 0735  NA 130* 129* 131*  K 3.8 5.2* 3.7  CL 94* 96* 96*  CO2 '28 26 28  '$ GLUCOSE 86 91 88  BUN 19 27* 15  CREATININE 4.49* 5.11* 3.59*  CALCIUM 9.0 8.8* 7.7*  PHOS 2.6 4.9*  --     Liver Function Tests: Recent Labs  Lab 09/28/20 0822 10/01/20 0751  ALBUMIN 1.7* 1.7*    No results for input(s): LIPASE, AMYLASE in the last 168 hours. No results for input(s): AMMONIA in the last 168 hours. CBC: Recent Labs  Lab 09/28/20 0822 09/29/20 0149 09/30/20 0854 09/30/20 1500 10/01/20 0232 10/02/20 0735  WBC 7.5 6.8 6.0  --  6.3 5.1  HGB 6.8* 8.6* 7.5* 6.2* 8.0* 7.1*  HCT 22.1* 26.5* 24.0* 19.8* 24.4* 22.7*  MCV 92.9 88.0 90.9  --  85.6 89.4  PLT 153 164 187  --  156 175    Cardiac Enzymes: No results for input(s): CKTOTAL, CKMB, CKMBINDEX, TROPONINI in the last 168 hours. CBG: Recent Labs  Lab 09/30/20 1951  GLUCAP 106*     Iron Studies: No results for input(s): IRON, TIBC, TRANSFERRIN, FERRITIN in the last 72 hours. Studies/Results: DG Chest Port 1 View  Result Date: 09/30/2020 CLINICAL DATA:  Central line placement. EXAM: PORTABLE CHEST 1 VIEW COMPARISON:  09/30/2020 at 9:34 a.m., and older exams. FINDINGS: New left internal jugular central venous line has its catheter tip at the caval atrial junction.  No pneumothorax. Persistent left lung base opacity obscures the left hemidiaphragm and portions of the left heart border consistent with a combination of pleural fluid and atelectasis, infection or a combination. Subtle hazy opacity at the right lateral lung base is consistent with a small effusion, also stable. No new lung abnormalities. IMPRESSION: 1. Left internal jugular central venous catheter has its tip at the caval atrial junction. No pneumothorax and no other change from the earlier exam. Electronically Signed   By: Lajean Manes M.D.   On: 09/30/2020 18:02   CT ANGIO GI BLEED  Result Date: 10/01/2020 CLINICAL DATA:  Gastrointestinal  hemorrhage EXAM: CTA ABDOMEN AND PELVIS WITHOUT AND WITH CONTRAST TECHNIQUE: Multidetector CT imaging of the abdomen and pelvis was performed using the standard protocol during bolus administration of intravenous contrast. Multiplanar reconstructed images and MIPs were obtained and reviewed to evaluate the vascular anatomy. CONTRAST:  172m OMNIPAQUE IOHEXOL 350 MG/ML SOLN COMPARISON:  None. FINDINGS: VASCULAR Aorta: Normal caliber. No aneurysm or dissection. Mild atherosclerotic calcification. No periaortic inflammatory change. Celiac: Unremarkable SMA: Unremarkable Renals: Single renal arteries bilaterally. No evidence of hemodynamically significant stenosis. Normal vascular morphology. No aneurysm. IMA: Greater than 50% stenosis secondary to atherosclerotic calcification at the origin. Inflow: Widely patent. No aneurysm or dissection. Moderate atherosclerotic calcification. Internal iliac arteries are patent bilaterally. Proximal Outflow: Unremarkable Veins: Unremarkable Review of the MIP images confirms the above findings. NON-VASCULAR Lower chest: Small bilateral pleural effusions are present with compressive atelectasis of the lower lobes bilaterally. Coronary artery bypass grafting has been performed. Cardiac size is mildly enlarged. No pericardial effusion. Hepatobiliary: The gallbladder is mildly distended and demonstrates mild gallbladder wall thickening and pericholecystic fluid. This is nonspecific in the setting of anasarca, however, acute cholecystitis is not excluded. No disruption of the gallbladder wall. No intra or extrahepatic biliary ductal dilation. Liver unremarkable. Pancreas: Unremarkable Spleen: Unremarkable Adrenals/Urinary Tract: The adrenal glands are unremarkable. The kidneys are normal in size and under mildly atrophic bilaterally. Multiple simple cortical cysts are seen within the kidneys bilaterally. No hydronephrosis. No intrarenal or ureteral calculi. The bladder is decompressed and  is unremarkable. Stomach/Bowel: Multiple endoscopic clips are seen within the gastric fundus. There is prominent submucosal edema within the gastric antrum possibly reflecting changes of antritis or peptic ulcer disease. No evidence of obstruction or perforation. Scattered diverticular seen throughout the colon. The stomach, small bowel, and large bowel are otherwise unremarkable. Appendix normal. No evidence of obstruction. No free intraperitoneal gas. Mild ascites. Lymphatic: No pathologic adenopathy within the abdomen and pelvis. Reproductive: Uterus and bilateral adnexa are unremarkable. Other: No abdominal wall hernia identified. There is diffuse subcutaneous edema identified within the abdominal wall in keeping with changes of anasarca. Rectum unremarkable. Musculoskeletal: No acute bone abnormality. Lumbar fusion with instrumentation has been performed at L3-4. Erosive changes involving the inferior endplate of L2 and superior endplate of L3 with associated mild focal kyphosis at this level is again identified and appears stable and is compatible with advanced degenerative disc disease. This is better assessed on prior MRI examination of 09/11/2020. IMPRESSION: VASCULAR No evidence of active gastrointestinal hemorrhage. No aortic aneurysm or dissection. 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 Wide patency of the celiac axis and superior mesenteric artery, however, are noted. NON-VASCULAR Mild cardiomegaly. Bilateral pleural effusions, mild ascites, and diffuse body wall subcutaneous edema in keeping with anasarca, possibly cardiogenic in nature. Gallbladder distension, gallbladder wall thickening, and trace pericholecystic fluid. This is nonspecific in the setting  of cardiogenic failure, however, acute cholecystitis is not excluded. Correlation with liver enzymes would be helpful in excluding an inflammatory process. This appearance, however, is stable from prior CT  examination of the chest of 09/23/2020 and similar gallbladder wall thickening was noted on CT examination the abdomen pelvis on 09/09/2020 favoring a chronic process. Multiple endoscopic clips noted within the gastric fundus. No active extravasation identified. Prominent submucosal edema involving the gastric antrum possibly related to local inflammation as can be seen with peptic ulcer disease or antritis. No evidence of obstruction or perforation. Colonic diverticulosis without superimposed acute inflammatory change. Aortic Atherosclerosis (ICD10-I70.0). Electronically Signed   By: Fidela Salisbury M.D.   On: 10/01/2020 04:16    Medications: Infusions:  sodium chloride     sodium chloride     sodium chloride 10 mL/hr at 10/02/20 [id number]   pantoprazole 8 mg/hr (10/02/20 0931)    Scheduled Medications:  sodium chloride   Intravenous Once   sodium chloride   Intravenous Once   sodium chloride   Intravenous Once   allopurinol  100 mg Oral QPM   amiodarone  200 mg Oral BID   amLODipine  10 mg Oral Daily   chlorhexidine  15 mL Mouth Rinse BID   Chlorhexidine Gluconate Cloth  6 each Topical Q0600   cholecalciferol  1,000 Units Oral QPM   cinacalcet  30 mg Oral Q M,W,F-HD   cloNIDine  0.1 mg Oral BID   darbepoetin (ARANESP) injection - DIALYSIS  100 mcg Intravenous Q Wed-HD   docusate sodium  100 mg Oral BID   feeding supplement  237 mL Oral TID BM   fluticasone  1 spray Each Nare Daily   folic acid  1 mg Oral Daily   Gerhardt's butt cream   Topical BID   hydrALAZINE  100 mg Oral Q8H   hydrocortisone  25 mg Rectal BID   irbesartan  300 mg Oral Daily   isosorbide mononitrate  30 mg Oral Daily   labetalol  200 mg Oral TID   magnesium oxide  200 mg Oral Daily   mouth rinse  15 mL Mouth Rinse q12n4p   montelukast  10 mg Oral Q supper   multivitamin  1 tablet Oral QHS   polyvinyl alcohol  1 drop Both Eyes TID AC & HS   predniSONE  5 mg Oral Q breakfast   saccharomyces boulardii  250 mg Oral  Daily   sodium chloride flush  3 mL Intravenous Q12H   umeclidinium bromide  1 puff Inhalation Daily    have reviewed scheduled and prn medications.  Physical Exam: General:NAD, looks comfortable Heart:RRR, s1s2 nl Lungs:clear b/l, no crackle Abdomen:soft, Non-tender, non-distended Extremities:No edema Dialysis Access: Left upper extremity AV fistula has good thrill and bruit.  Rexene Agent 10/02/2020,12:34 PM  LOS: 24 days

## 2020-10-02 NOTE — Progress Notes (Signed)
OT Cancellation Note  Patient Details Name: SABRIE MENZIES MRN: [id number] DOB: 09-Nov-1960   Cancelled Treatment:    Reason Eval/Treat Not Completed: Patient at procedure or test/ unavailable. Will follow and see as able.  Jolaine Artist, OT Acute Rehabilitation Services Pager [phone] Office [phone]   Delight Stare 10/02/2020, 8:51 AM

## 2020-10-02 NOTE — Anesthesia Postprocedure Evaluation (Signed)
Anesthesia Post Note  Patient: Jodi Bell  Procedure(s) Performed: ESOPHAGOGASTRODUODENOSCOPY (EGD) WITH PROPOFOL COLONOSCOPY WITH PROPOFOL     Anesthesia Post Evaluation No notable events documented.  Last Vitals:  Vitals:   10/02/20 1057 10/02/20 1100  BP: 111/64   Pulse: 63   Resp: 13   Temp: (!) 36.4 C   SpO2:  100%    Last Pain:  Vitals:   10/02/20 1057  TempSrc: Oral  PainSc:                  Grayson Pfefferle

## 2020-10-02 NOTE — Anesthesia Preprocedure Evaluation (Addendum)
Anesthesia Evaluation  Patient identified by MRN, date of birth, ID band Patient awake    Reviewed: Allergy & Precautions, NPO status , Patient's Chart, lab work & pertinent test results  Airway Mallampati: I  TM Distance: >3 FB Neck ROM: Limited    Dental  (+) Dental Advisory Given, Partial Lower, Edentulous Upper   Pulmonary shortness of breath, asthma , sleep apnea , pneumonia, COPD, Current Smoker,    breath sounds clear to auscultation       Cardiovascular hypertension, + CAD, + Past MI, + Peripheral Vascular Disease, +CHF and + DVT   Rhythm:Regular Rate:Bradycardia     Neuro/Psych  Headaches,    GI/Hepatic Neg liver ROS, hiatal hernia, GERD  ,  Endo/Other  diabetes  Renal/GU Renal disease     Musculoskeletal  (+) Arthritis ,   Abdominal   Peds  Hematology   Anesthesia Other Findings   Reproductive/Obstetrics                           Anesthesia Physical Anesthesia Plan  ASA: 3  Anesthesia Plan: MAC   Post-op Pain Management:    Induction: Intravenous  PONV Risk Score and Plan: 2 and Propofol infusion  Airway Management Planned: Nasal Cannula and Simple Face Mask  Additional Equipment:   Intra-op Plan:   Post-operative Plan:   Informed Consent: I have reviewed the patients History and Physical, chart, labs and discussed the procedure including the risks, benefits and alternatives for the proposed anesthesia with the patient or authorized representative who has indicated his/her understanding and acceptance.     Dental advisory given  Plan Discussed with: CRNA and Anesthesiologist  Anesthesia Plan Comments:        Anesthesia Quick Evaluation

## 2020-10-02 NOTE — Interval H&P Note (Signed)
History and Physical Interval Note:  10/02/2020 9:32 AM  Jodi Bell  has presented today for surgery, with the diagnosis of GI bleeding.  The various methods of treatment have been discussed with the patient and family. After consideration of risks, benefits and other options for treatment, the patient has consented to  Procedure(s): ESOPHAGOGASTRODUODENOSCOPY (EGD) WITH PROPOFOL (N/A) COLONOSCOPY WITH PROPOFOL (N/A) as a surgical intervention.  The patient's history has been reviewed, patient examined, no change in status, stable for surgery.  I have reviewed the patient's chart and labs.  Questions were answered to the patient's satisfaction.     Landry Dyke

## 2020-10-03 ENCOUNTER — Encounter (HOSPITAL_COMMUNITY): Payer: Self-pay | Admitting: Gastroenterology

## 2020-10-03 LAB — CBC
HCT: 23.4 % — ABNORMAL LOW (ref 36.0–46.0)
Hemoglobin: 7.3 g/dL — ABNORMAL LOW (ref 12.0–15.0)
MCH: 28.3 pg (ref 26.0–34.0)
MCHC: 31.2 g/dL (ref 30.0–36.0)
MCV: 90.7 fL (ref 80.0–100.0)
Platelets: 210 10*3/uL (ref 150–400)
RBC: 2.58 MIL/uL — ABNORMAL LOW (ref 3.87–5.11)
RDW: 21.3 % — ABNORMAL HIGH (ref 11.5–15.5)
WBC: 5.7 10*3/uL (ref 4.0–10.5)
nRBC: 0 % (ref 0.0–0.2)

## 2020-10-03 LAB — FERRITIN: Ferritin: 1000 ng/mL — ABNORMAL HIGH (ref 11–307)

## 2020-10-03 LAB — RENAL FUNCTION PANEL
Albumin: 1.8 g/dL — ABNORMAL LOW (ref 3.5–5.0)
Anion gap: 8 (ref 5–15)
BUN: 18 mg/dL (ref 6–20)
CO2: 26 mmol/L (ref 22–32)
Calcium: 8.3 mg/dL — ABNORMAL LOW (ref 8.9–10.3)
Chloride: 96 mmol/L — ABNORMAL LOW (ref 98–111)
Creatinine, Ser: 4.52 mg/dL — ABNORMAL HIGH (ref 0.44–1.00)
GFR, Estimated: 11 mL/min — ABNORMAL LOW (ref >60)
Glucose, Bld: 83 mg/dL (ref 70–99)
Phosphorus: 4.3 mg/dL (ref 2.5–4.6)
Potassium: 4 mmol/L (ref 3.5–5.1)
Sodium: 130 mmol/L — ABNORMAL LOW (ref 135–145)

## 2020-10-03 LAB — IRON AND TIBC
Iron: 43 ug/dL (ref 28–170)
Saturation Ratios: 32 % — ABNORMAL HIGH (ref 10.4–31.8)
TIBC: 136 ug/dL — ABNORMAL LOW (ref 250–450)
UIBC: 93 ug/dL

## 2020-10-03 MED ORDER — PANTOPRAZOLE SODIUM 40 MG PO TBEC
40.0000 mg | DELAYED_RELEASE_TABLET | Freq: Every day | ORAL | Status: DC
  Administered 2020-10-03 – 2020-10-04 (×2): 40 mg via ORAL
  Filled 2020-10-03 (×2): qty 1

## 2020-10-03 MED ORDER — DARBEPOETIN ALFA 200 MCG/0.4ML IJ SOSY
PREFILLED_SYRINGE | INTRAMUSCULAR | Status: AC
  Administered 2020-10-03: 200 ug via INTRAVENOUS
  Filled 2020-10-03: qty 0.4

## 2020-10-03 MED ORDER — OXYCODONE HCL 5 MG PO TABS
ORAL_TABLET | ORAL | Status: AC
  Administered 2020-10-03: 10 mg via ORAL
  Filled 2020-10-03: qty 2

## 2020-10-03 MED ORDER — DARBEPOETIN ALFA 200 MCG/0.4ML IJ SOSY
200.0000 ug | PREFILLED_SYRINGE | INTRAMUSCULAR | Status: DC
  Filled 2020-10-03: qty 0.4

## 2020-10-03 NOTE — TOC Progression Note (Signed)
Transition of Care [facility]) - Progression Note    Patient Details  Name: Jodi Bell MRN: [id number] Date of Birth: 01/06/1961  Transition of Care [facility]) CM/SW Contact  Carles Collet, RN Phone Number: 10/03/2020, 11:30 AM  Clinical Narrative:     [name] w patient and daughter at bedside in room yesterday. They are both choosing to go home w [facility] services. Patient and daughter live together.  Patient would like rollator, it has been delivered by [employer]. Referral made to [facility] per pt request. No other CM needs identified at this time  Expected Discharge Plan: [place] Barriers to Discharge: Continued Medical Work up  Expected Discharge Plan and Services Expected Discharge Plan: [place] In-house Referral: Clinical Social Work Discharge Planning Services: CM Consult Post Acute Care Choice: Home Health, Durable Medical Equipment Living arrangements for the past 2 months: Single Family Home                 DME Arranged: Walker rolling with seat DME Agency:  Celesta Aver) Date DME Agency Contacted: 10/02/20 Time DME Agency Contacted: [phone] Representative spoke with at DME Agency: Brenton Grills HH Arranged: PT [place]: [facility] Date [place]: 10/03/20 Time [place]: 1130 Representative spoke with at [place]: [place] ([place]) Interventions    Readmission Risk Interventions No flowsheet data found.

## 2020-10-03 NOTE — Plan of Care (Signed)
  Problem: Clinical Measurements: Goal: Will remain free from infection Outcome: Progressing Goal: Diagnostic test results will improve Outcome: Progressing Goal: Cardiovascular complication will be avoided Outcome: Progressing   

## 2020-10-03 NOTE — Progress Notes (Signed)
Subjective: No further bleeding. No abdominal pain. Wants diet advanced if possible.  Objective: Vital signs in last 24 hours: Temp:  [97.7 F (36.5 C)-98.8 F (37.1 C)] 97.7 F (36.5 C) (08/24 1040) Pulse Rate:  [76-84] 81 (08/24 1133) Resp:  [15-20] 19 (08/24 0725) BP: (128-186)/(59-89) 157/63 (08/24 1133) SpO2:  [96 %-100 %] 98 % (08/24 0402) Weight:  [64 kg-67 kg] 64 kg (08/24 1040) Weight change: 0.9 kg Last BM Date: 10/02/20  PE: GEN:  NAD  Lab Results: CBC    Component Value Date/Time   WBC 5.7 10/03/2020 0806   RBC 2.58 (L) 10/03/2020 0806   HGB 7.3 (L) 10/03/2020 0806   HCT 23.4 (L) 10/03/2020 0806   PLT 210 10/03/2020 0806   MCV 90.7 10/03/2020 0806   MCV 94.5 10/12/2012 1211   MCH 28.3 10/03/2020 0806   MCHC 31.2 10/03/2020 0806   RDW 21.3 (H) 10/03/2020 0806   LYMPHSABS 1.4 09/22/2020 0639   MONOABS 0.7 09/22/2020 0639   EOSABS 0.3 09/22/2020 0639   BASOSABS 0.0 09/22/2020 0639  CMP     Component Value Date/Time   NA 130 (L) 10/03/2020 0806   K 4.0 10/03/2020 0806   CL 96 (L) 10/03/2020 0806   CO2 26 10/03/2020 0806   GLUCOSE 83 10/03/2020 0806   BUN 18 10/03/2020 0806   CREATININE 4.52 (H) 10/03/2020 0806   CREATININE 1.34 (H) 09/18/2011 1400   CALCIUM 8.3 (L) 10/03/2020 0806   PROT 4.0 (L) 09/13/2020 1940   ALBUMIN 1.8 (L) 10/03/2020 0806   AST 16 09/13/2020 1940   ALT 9 09/13/2020 1940   ALKPHOS 45 09/13/2020 1940   BILITOT 0.5 09/13/2020 1940   GFRNONAA 11 (L) 10/03/2020 0806   GFRAA 16 (L) 11/09/2019 1152   Assessment:   Acute blood loss anemia. Blood in stool.  Fortunately, at this point, no further GI bleeding x 2 days. Recurrent GI bleeding, on egd yesterday no further gastric bleeding and no blood (old/fresh) in upper tract.  Colonoscopy no blood in lower tract, did show pancolonic diverticulosis as well as ulcerated internal hemorrhoid.  Suspect more recent fresh red blood per rectum is from hemorrhoids (diverticulosis is less  likely secondary consideration).   Plan:   Continue hemorrhoidal suppository (HC cream 25 grams PR BID) for 10 more days scheduled, then prn thereafter. Likely no major systemic anticoagulants (other than heparin during dialysis) for another 10 days if clinically feasible. Ok to advance diet to regular renal diet. PPI pantoprazole 40 mg po qd now and indefinitely thereafter. [facility] will sign-off; we will arrange outpatient follow-up.  Thank you.   Jodi Bell 10/03/2020, 11:50 AM   Cell [phone] If no answer or after 5 PM call [phone]

## 2020-10-03 NOTE — Progress Notes (Signed)
[facility] NEPHROLOGY PROGRESS NOTE  Assessment/ Plan:  # NSTEMI s/p 2VCABG: 09/13/2020.    #  ESRD, MWF schedule at [facility], HD on schedule. Probe weights down as able.  No heparin with HD. 2K. 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 Tranfused 8/21.  Still with some GIB, GI following, CTA overnight.  No obvious source of bleeding.   INc aranesp to 200 qWed and blood transfusion as needed.  Iron panel added on,.   # CKD-MBD: Ca down, hold sensipar. P ok not on binders.   #  Hypertension: BP stable, cont antihypertensives and UF during HD.    # Left arm swelling: No evidence of DVT and appears to be improving with ongoing ultrafiltration with hemodialysis.  May need fistulogram as an outpatient to assess for CV stenosis.    Subjective:  No interval events Hb 7.1 to 7.3 Seen on HD: 2K, 3L UF goal, using AVF LUE Afebrile, vital signs stable, on room air  Objective Vital signs in last 24 hours: Vitals:   10/03/20 0720 10/03/20 0725 10/03/20 0735 10/03/20 0800  BP:  (!) 158/80 (!) 153/77 (!) 143/79  Pulse:  79 76 78  Resp:  19    Temp:  98.3 F (36.8 C)    TempSrc:  Oral    SpO2:      Weight: 67 kg     Height:       Weight change: 0.9 kg  Intake/Output Summary (Last 24 hours) at 10/03/2020 [id number] Last data filed at 10/03/2020 0321 Gross per 24 hour  Intake 609.8 ml  Output --  Net 609.8 ml        Labs: Basic Metabolic Panel: Recent Labs  Lab 09/28/20 0822 10/01/20 0751 10/02/20 0735  NA 130* 129* 131*  K 3.8 5.2* 3.7  CL 94* 96* 96*  CO2 '28 26 28  '$ GLUCOSE 86 91 88  BUN 19 27* 15  CREATININE 4.49* 5.11* 3.59*  CALCIUM 9.0 8.8* 7.7*  PHOS 2.6 4.9*  --     Liver Function Tests: Recent Labs  Lab 09/28/20 0822 10/01/20 0751  ALBUMIN 1.7* 1.7*    No results for input(s): LIPASE, AMYLASE in the  last 168 hours. No results for input(s): AMMONIA in the last 168 hours. CBC: Recent Labs  Lab 09/28/20 0822 09/29/20 0149 09/30/20 0854 09/30/20 1500 10/01/20 0232 10/02/20 0735  WBC 7.5 6.8 6.0  --  6.3 5.1  HGB 6.8* 8.6* 7.5* 6.2* 8.0* 7.1*  HCT 22.1* 26.5* 24.0* 19.8* 24.4* 22.7*  MCV 92.9 88.0 90.9  --  85.6 89.4  PLT 153 164 187  --  156 175    Cardiac Enzymes: No results for input(s): CKTOTAL, CKMB, CKMBINDEX, TROPONINI in the last 168 hours. CBG: Recent Labs  Lab 09/30/20 1951  GLUCAP 106*     Iron Studies: No results for input(s): IRON, TIBC, TRANSFERRIN, FERRITIN in the last 72 hours. Studies/Results: No results found.  Medications: Infusions:  sodium chloride     sodium chloride     sodium chloride 10 mL/hr at 10/02/20 0904   pantoprazole 8 mg/hr (10/03/20 0127)   promethazine (PHENERGAN) injection (IM or IVPB)      Scheduled Medications:  allopurinol  100 mg Oral QPM   amiodarone  200 mg Oral BID   amLODipine  10 mg Oral Daily   chlorhexidine  15 mL  Mouth Rinse BID   Chlorhexidine Gluconate Cloth  6 each Topical Q0600   cholecalciferol  1,000 Units Oral QPM   cinacalcet  30 mg Oral Q M,W,F-HD   cloNIDine  0.1 mg Oral BID   darbepoetin (ARANESP) injection - DIALYSIS  100 mcg Intravenous Q Wed-HD   docusate sodium  100 mg Oral BID   feeding supplement  237 mL Oral TID BM   fluticasone  1 spray Each Nare Daily   folic acid  1 mg Oral Daily   Gerhardt's butt cream   Topical BID   hydrALAZINE  100 mg Oral Q8H   hydrocortisone  25 mg Rectal BID   irbesartan  300 mg Oral Daily   isosorbide mononitrate  30 mg Oral Daily   labetalol  200 mg Oral TID   magnesium oxide  200 mg Oral Daily   mouth rinse  15 mL Mouth Rinse q12n4p   montelukast  10 mg Oral Q supper   multivitamin  1 tablet Oral QHS   polyvinyl alcohol  1 drop Both Eyes TID AC & HS   predniSONE  5 mg Oral Q breakfast   saccharomyces boulardii  250 mg Oral Daily   sodium chloride flush  3  mL Intravenous Q12H   umeclidinium bromide  1 puff Inhalation Daily    have reviewed scheduled and prn medications.  Physical Exam: General:NAD, looks comfortable Heart:RRR, s1s2 nl Lungs:clear b/l, no crackle Abdomen:soft, Non-tender, non-distended Extremities:No edema in LEs, LUE with 1+ to 2+ edema Dialysis Access: Left upper extremity AV fistula has good thrill and bruit.   Mariya Mottley B Jocilyn Trego 10/03/2020,8:22 AM  LOS: 25 days

## 2020-10-03 NOTE — Progress Notes (Signed)
CARDIAC REHAB PHASE I   PRE:  Rate/Rhythm: 31 SR  BP:  Sitting: 111/60      SaO2: 95 RA  MODE:  Ambulation: 100 ft   POST:  Rate/Rhythm: 76 SR  BP:  Sitting: 112/55    SaO2: 96 RA   Pt ambulated 125f in hallway assist of one with front wheel walker. Pt c/o feeling "swimmy headed". Pt returned to room. BP where it was prior to walk, but lower than previous readings. Encouraged some rest and continued ambulation later with mobility team.   [id number] TRufina Falco RN BSN 10/03/2020 2:34 PM

## 2020-10-03 NOTE — Progress Notes (Addendum)
[address]       [place] [address]             [phone]      1 Day Post-Op Procedure(s) (LRB): ESOPHAGOGASTRODUODENOSCOPY (EGD) WITH PROPOFOL (N/A) COLONOSCOPY WITH PROPOFOL (N/A) Subjective: Feels pretty well  Objective: Vital signs in last 24 hours: Temp:  [97.5 F (36.4 C)-98.8 F (37.1 C)] 98.3 F (36.8 C) (08/24 0402) Pulse Rate:  [59-84] 79 (08/24 0402) Cardiac Rhythm: Heart block (08/23 1900) Resp:  [10-20] 15 (08/24 0402) BP: (103-186)/(47-89) 186/70 (08/24 0644) SpO2:  [94 %-100 %] 98 % (08/24 0402) Weight:  [66.2 kg-66.9 kg] 66.9 kg (08/24 0402)  Hemodynamic parameters for last 24 hours:    Intake/Output from previous day: 08/23 0701 - 08/24 0700 In: 609.8 [I.V.:609.8] Out: -  Intake/Output this shift: No intake/output data recorded.  General appearance: alert, cooperative, and no distress Heart: regular rate and rhythm Lungs: dim left lower fields Abdomen: benign Extremities: left upper ext edema stable Wound: incis healing will  Lab Results: Recent Labs    10/01/20 0232 10/02/20 0735  WBC 6.3 5.1  HGB 8.0* 7.1*  HCT 24.4* 22.7*  PLT 156 175   BMET:  Recent Labs    10/01/20 0751 10/02/20 0735  NA 129* 131*  K 5.2* 3.7  CL 96* 96*  CO2 26 28  GLUCOSE 91 88  BUN 27* 15  CREATININE 5.11* 3.59*  CALCIUM 8.8* 7.7*    PT/INR: No results for input(s): LABPROT, INR in the last 72 hours. ABG    Component Value Date/Time   PHART 7.439 09/17/2020 1014   HCO3 29.6 (H) 09/17/2020 1014   TCO2 31 09/17/2020 1014   ACIDBASEDEF 1.0 09/13/2020 2152   O2SAT 91.0 09/17/2020 1014   CBG (last 3)  Recent Labs    09/30/20 1951  GLUCAP 106*    Meds Scheduled Meds:  allopurinol  100 mg Oral QPM   amiodarone  200 mg Oral BID   amLODipine  10 mg Oral Daily   chlorhexidine  15 mL Mouth Rinse BID   Chlorhexidine Gluconate Cloth  6 each Topical Q0600   cholecalciferol  1,000 Units Oral QPM   cinacalcet  30 mg Oral Q  M,W,F-HD   cloNIDine  0.1 mg Oral BID   darbepoetin (ARANESP) injection - DIALYSIS  100 mcg Intravenous Q Wed-HD   docusate sodium  100 mg Oral BID   feeding supplement  237 mL Oral TID BM   fluticasone  1 spray Each Nare Daily   folic acid  1 mg Oral Daily   Gerhardt's butt cream   Topical BID   hydrALAZINE  100 mg Oral Q8H   hydrocortisone  25 mg Rectal BID   irbesartan  300 mg Oral Daily   isosorbide mononitrate  30 mg Oral Daily   labetalol  200 mg Oral TID   magnesium oxide  200 mg Oral Daily   mouth rinse  15 mL Mouth Rinse q12n4p   montelukast  10 mg Oral Q supper   multivitamin  1 tablet Oral QHS   polyvinyl alcohol  1 drop Both Eyes TID AC & HS   predniSONE  5 mg Oral Q breakfast   saccharomyces boulardii  250 mg Oral Daily   sodium chloride flush  3 mL Intravenous Q12H   umeclidinium bromide  1 puff Inhalation Daily   Continuous Infusions:  sodium chloride     sodium chloride     sodium  chloride 10 mL/hr at 10/02/20 0904   pantoprazole 8 mg/hr (10/03/20 0127)   promethazine (PHENERGAN) injection (IM or IVPB)     PRN Meds:.sodium chloride, sodium chloride, acetaminophen, albuterol, alteplase, gabapentin, heparin, heparin, heparin, lidocaine-prilocaine, LORazepam, ondansetron (ZOFRAN) IV, oxyCODONE, pentafluoroprop-tetrafluoroeth, promethazine (PHENERGAN) injection (IM or IVPB), sodium chloride flush, sorbitol  Xrays No results found.  Assessment/Plan: S/P Procedure(s) (LRB): ESOPHAGOGASTRODUODENOSCOPY (EGD) WITH PROPOFOL (N/A) COLONOSCOPY WITH PROPOFOL (N/A)   1 afeb, VSS with some hypertensive readings 2 sats good on RA 3 EGD/colonoscopy results noted , no active bleeding. H/H 7.1/22 yesterday, will repeat in am as close to transfusion threshold 4 currently in dialysis, nephrology managing renal /HD issues 5 push nutrition/rehab as able 6 she can probably be d/c'd in am if no new issues and all agree   LOS: 25 days    [name] PA-C Pager [phone] 10/03/2020   Chart reviewed, patient examined, agree with above. Feels well overall. No further bleeding. Only complaint is of pain along right lower end of sternotomy. Sternum feels stable and incision healing well. Lower sternum looked fine on recent CT 10/01/20. I think she can go home tomorrow. Would not send home on ASA and I will resume when I see her back in office if no further bleeding.

## 2020-10-04 ENCOUNTER — Other Ambulatory Visit: Payer: Self-pay

## 2020-10-04 DIAGNOSIS — I480 Paroxysmal atrial fibrillation: Secondary | ICD-10-CM

## 2020-10-04 DIAGNOSIS — I10 Essential (primary) hypertension: Secondary | ICD-10-CM

## 2020-10-04 LAB — CBC
HCT: 21.9 % — ABNORMAL LOW (ref 36.0–46.0)
Hemoglobin: 7 g/dL — ABNORMAL LOW (ref 12.0–15.0)
MCH: 28.9 pg (ref 26.0–34.0)
MCHC: 32 g/dL (ref 30.0–36.0)
MCV: 90.5 fL (ref 80.0–100.0)
Platelets: 169 10*3/uL (ref 150–400)
RBC: 2.42 MIL/uL — ABNORMAL LOW (ref 3.87–5.11)
RDW: 21.3 % — ABNORMAL HIGH (ref 11.5–15.5)
WBC: 4.5 10*3/uL (ref 4.0–10.5)
nRBC: 0 % (ref 0.0–0.2)

## 2020-10-04 MED ORDER — HYDROCORTISONE ACETATE 25 MG RE SUPP: 25.0000 mg | Freq: Two times a day (BID) | RECTAL | 1 refills | Status: DC

## 2020-10-04 MED ORDER — LIDOCAINE-PRILOCAINE 2.5-2.5 % EX CREA: 1.0000 "application " | TOPICAL_CREAM | CUTANEOUS | 0 refills | Status: DC | PRN

## 2020-10-04 MED ORDER — FOLIC ACID 1 MG PO TABS: 1.0000 mg | ORAL_TABLET | Freq: Every day | ORAL | 1 refills | Status: DC

## 2020-10-04 MED ORDER — HEPARIN SODIUM (PORCINE) 1000 UNIT/ML DIALYSIS: 3000.0000 [IU] | INTRAMUSCULAR | Status: DC | PRN

## 2020-10-04 MED ORDER — DARBEPOETIN ALFA 200 MCG/0.4ML IJ SOSY: 200.0000 ug | PREFILLED_SYRINGE | INTRAMUSCULAR | Status: DC

## 2020-10-04 MED ORDER — SORBITOL 70 % SOLN: 30.0000 mL | Freq: Every day | 1 refills | Status: DC | PRN

## 2020-10-04 MED ORDER — VITAMIN D3 25 MCG PO TABS: 1000.0000 [IU] | ORAL_TABLET | Freq: Every evening | ORAL | 1 refills | Status: DC

## 2020-10-04 MED ORDER — CLONIDINE HCL 0.1 MG PO TABS: 0.1000 mg | ORAL_TABLET | Freq: Two times a day (BID) | ORAL | 1 refills | Status: DC

## 2020-10-04 MED ORDER — OXYCODONE HCL 5 MG PO TABS: 5.0000 mg | ORAL_TABLET | Freq: Four times a day (QID) | ORAL | 0 refills | Status: AC | PRN

## 2020-10-04 MED ORDER — AMIODARONE HCL 200 MG PO TABS: 200.0000 mg | ORAL_TABLET | Freq: Every day | ORAL | 1 refills | Status: DC

## 2020-10-04 MED ORDER — PENTAFLUOROPROP-TETRAFLUOROETH EX AERO: 1.0000 "application " | INHALATION_SPRAY | CUTANEOUS | 0 refills | Status: DC | PRN

## 2020-10-04 MED ORDER — HEPARIN SODIUM (PORCINE) 1000 UNIT/ML DIALYSIS: 2000.0000 [IU] | INTRAMUSCULAR | Status: DC | PRN

## 2020-10-04 MED ORDER — ISOSORBIDE MONONITRATE ER 30 MG PO TB24: 30.0000 mg | ORAL_TABLET | Freq: Every day | ORAL | 1 refills | Status: DC

## 2020-10-04 MED ORDER — HEPARIN SODIUM (PORCINE) 1000 UNIT/ML DIALYSIS: 1000.0000 [IU] | INTRAMUSCULAR | Status: DC | PRN

## 2020-10-04 MED ORDER — ENSURE ENLIVE PO LIQD: 237.0000 mL | Freq: Three times a day (TID) | ORAL | 12 refills | Status: DC

## 2020-10-04 MED ORDER — LABETALOL HCL 200 MG PO TABS: 200.0000 mg | ORAL_TABLET | Freq: Three times a day (TID) | ORAL | 1 refills | Status: DC

## 2020-10-04 MED ORDER — ENSURE ENLIVE PO LIQD: 1.0000 | Freq: Three times a day (TID) | ORAL | 2 refills | Status: DC

## 2020-10-04 NOTE — Care Management Important Message (Signed)
Important Message  Patient Details  Name: Jodi Bell MRN: [id number] Date of Birth: 07-06-1960   Medicare Important Message Given:  Yes     Shelda Altes 10/04/2020, 9:30 AM

## 2020-10-04 NOTE — Progress Notes (Signed)
[facility] pharmacy stated missing information in original prescription. Requested update. Sent in electronically to [facility].

## 2020-10-04 NOTE — Progress Notes (Signed)
Physical Therapy Treatment Patient Details Name: Jodi Bell MRN: [id number] DOB: 05-Jan-1961 Today's Date: 10/04/2020    History of Present Illness Pt is a 60 y.o. female admitted 09/08/20 with acute onset SOB. Workup for CHF decompensation, COPD exacerbation, HTN urgency, NSTEMI; need for emergent HD. S/p LHC 8/1 which showed severe two-vessel CAD. CABGx2 8/4. GI bleeding scan negative 08/11. PMH includes ESRD (HD MWF), HTN, DM2, COPD, OSA, psoriatic RA, PAD, anemia, tobacco abuse.    PT Comments    Pt received in chair, daughter also present and supportive, pt agreeable and motivated to participate in functional mobility training this date. Pt and daughter able to demonstrate safe stair ascent/descent (daughter instructed on guarding positions and able to return demo), pt performing all tasks with Supervision to min guard after initial minA to ascend step with U UE support. Pt given HEP and stair sequencing handouts and receptive to instruction. Pt continues to benefit from PT services to progress toward functional mobility goals.    Follow Up Recommendations  Home health PT;Supervision for mobility/OOB     Equipment Recommendations  3in1 (PT)    Recommendations for Other Services       Precautions / Restrictions Precautions Precautions: Fall;Sternal Precaution Booklet Issued: Yes (comment) Precaution Comments: min cueing to adhere functionally Restrictions Weight Bearing Restrictions: No Other Position/Activity Restrictions: sternal precautions    Mobility  Bed Mobility Overal bed mobility: Modified Independent             General bed mobility comments: pt received in recliner    Transfers Overall transfer level: Needs assistance Equipment used: Rolling walker (2 wheeled) Transfers: Sit to/from Stand Sit to Stand: Supervision         General transfer comment: for safety, cues for precautions prior to sitting  Ambulation/Gait Ambulation/Gait assistance:  Supervision Gait Distance (Feet): 20 Feet Assistive device: Rolling walker (2 wheeled) Gait Pattern/deviations: Decreased stride length;Step-through pattern Gait velocity: Decreased   General Gait Details: good use of RW, pt self-limiting distance due to fatigue   Stairs Stairs: Yes Stairs assistance: Min guard;Min assist Stair Management: One rail Right;Step to pattern;Forwards Number of Stairs: 5 General stair comments: pt ascended/descended single step in room x5 reps with RW set up over it to simulate single handrail, pt needing minA with initial ascent but with repetition improves to min guard, her daughter also present and able to demonstrate proper guarding technique with pt at stairs. Obtained gait belt for pt to take home for safety with stair ascent with family assist and printout for stair sequencing to reinforce. Also discussed use of cane with stairs and single railing for improved independence as she needs BUE support for balance   Wheelchair Mobility    Modified Rankin (Stroke Patients Only)       Balance Overall balance assessment: Needs assistance Sitting-balance support: No upper extremity supported;Feet supported Sitting balance-Leahy Scale: Good     Standing balance support: Bilateral upper extremity supported;During functional activity Standing balance-Leahy Scale: Fair Standing balance comment: B UE support dynamcially, especially with stairs                            Cognition Arousal/Alertness: Awake/alert Behavior During Therapy: WFL for tasks assessed/performed Overall Cognitive Status: Impaired/Different from baseline Area of Impairment: Memory                     Memory: Decreased recall of precautions  General Comments: cueing to adhere to precautions, improving awareness and problem solving although pt still forgetting which leg feels "strong" and which feels "weak" so printed out stair sequencing instructions for  pt/daughter to reinforce and also HEP instructions.      Exercises Other Exercises Other Exercises: HEP handout given: [email or web address] Access Code; [email or web address]    General Comments General comments (skin integrity, edema, etc.): VSS, no dizziness and not diaphoretic      Pertinent Vitals/Pain Pain Assessment: Faces Faces Pain Scale: Hurts a little bit Pain Location: generalized, incisional Pain Descriptors / Indicators: Discomfort;Operative site guarding Pain Intervention(s): Limited activity within patient's tolerance;Monitored during session;Repositioned     PT Goals (current goals can now be found in the care plan section) Acute Rehab PT Goals Patient Stated Goal: return home to stay with son, she will have assist from family PT Goal Formulation: With patient Time For Goal Achievement: 10/04/20 Progress towards PT goals: Progressing toward goals    Frequency    Min 3X/week      PT Plan Current plan remains appropriate       AM-PAC PT "6 Clicks" Mobility   Outcome Measure  Help needed turning from your back to your side while in a flat bed without using bedrails?: None Help needed moving from lying on your back to sitting on the side of a flat bed without using bedrails?: A Little Help needed moving to and from a bed to a chair (including a wheelchair)?: A Little Help needed standing up from a chair using your arms (e.g., wheelchair or bedside chair)?: A Little Help needed to walk in hospital room?: A Little Help needed climbing 3-5 steps with a railing? : A Little 6 Click Score: 19    End of Session Equipment Utilized During Treatment: Gait belt Activity Tolerance: Patient tolerated treatment well Patient left: in chair;with call bell/phone within reach;with family/visitor present Nurse Communication: Mobility status;Other (comment) (pt asking about discharge paperwork and wheelchair) PT Visit Diagnosis: Other abnormalities of gait and mobility  (R26.89);Muscle weakness (generalized) (M62.81);Difficulty in walking, not elsewhere classified (R26.2)     Time: [id number] PT Time Calculation (min) (ACUTE ONLY): 12 min  Charges:  $Gait Training: 8-22 mins                     Michaeline Eckersley P., PTA Acute Rehabilitation Services Pager: [phone] Office: [place] 10/04/2020, 12:05 PM

## 2020-10-04 NOTE — Progress Notes (Signed)
Occupational Therapy Treatment Patient Details Name: Jodi Bell MRN: [id number] DOB: [birth year] Today's Date: 10/04/2020    History of present illness Pt is a 60 y.o. female admitted 09/08/20 with acute onset SOB. Workup for CHF decompensation, COPD exacerbation, HTN urgency, NSTEMI; need for emergent HD. S/p LHC 8/1 which showed severe two-vessel CAD. CABGx2 8/4. GI bleeding scan negative 08/11. PMH includes ESRD (HD MWF), HTN, DM2, COPD, OSA, psoriatic RA, PAD, anemia, tobacco abuse.   OT comments  Patient eager for dc home. Completing transfers and mobility using RW with supervision. Educated on safety with shower transfers, bathing safety after clearance from surgeon.  Pt requires min cueing for sternal precautions, LB dressing with supervision.  Plans to have support from son and daughter at dc.  HHOT remains appropriate.    Follow Up Recommendations  Home health OT;Supervision/Assistance - 24 hour    Equipment Recommendations  3 in 1 bedside commode    Recommendations for Other Services      Precautions / Restrictions Precautions Precautions: Fall;Sternal Precaution Booklet Issued: Yes (comment) Precaution Comments: min cueing to adhere functionally Restrictions Other Position/Activity Restrictions: sternal precautions       Mobility Bed Mobility Overal bed mobility: Modified Independent             General bed mobility comments: HOB elevated but no assist required    Transfers Overall transfer level: Needs assistance Equipment used: Rolling walker (2 wheeled) Transfers: Sit to/from Stand Sit to Stand: Supervision         General transfer comment: for safety    Balance Overall balance assessment: Needs assistance Sitting-balance support: No upper extremity supported;Feet supported Sitting balance-Leahy Scale: Good     Standing balance support: Bilateral upper extremity supported;During functional activity Standing balance-Leahy Scale:  Fair Standing balance comment: B UE support dynamcially                           ADL either performed or assessed with clinical judgement   ADL Overall ADL's : Needs assistance/impaired                     Lower Body Dressing: Supervision/safety;Sit to/from stand   Toilet Transfer: Supervision/safety;Ambulation;RW       Tub/ Shower Transfer: Walk-in shower;Supervision/safety;Ambulation;Shower seat;Rolling walker   Functional mobility during ADLs: Supervision/safety;Rolling walker General ADL Comments: progressing well     Vision       Perception     Praxis      Cognition Arousal/Alertness: Awake/alert Behavior During Therapy: WFL for tasks assessed/performed Overall Cognitive Status: Impaired/Different from baseline Area of Impairment: Memory                     Memory: Decreased recall of precautions         General Comments: cueing to adhere to precautions, improving awareness and problem solving as pt thinking through bathroom setups and needs at dc        Exercises     Shoulder Instructions       General Comments VSS on RA    Pertinent Vitals/ Pain       Pain Assessment: Faces Faces Pain Scale: Hurts a little bit Pain Location: generalized, incisional Pain Descriptors / Indicators: Discomfort;Operative site guarding Pain Intervention(s): Limited activity within patient's tolerance;Monitored during session;Repositioned  Home Living  Prior Functioning/Environment              Frequency  Min 2X/week        Progress Toward Goals  OT Goals(current goals can now be found in the care plan section)  Progress towards OT goals: Progressing toward goals  Acute Rehab OT Goals Patient Stated Goal: return home to stay with son, she will have assist from family OT Goal Formulation: With patient  Plan Discharge plan remains appropriate;Frequency remains  appropriate    Co-evaluation                 AM-PAC OT "6 Clicks" Daily Activity     Outcome Measure   Help from another person eating meals?: None Help from another person taking care of personal grooming?: A Little Help from another person toileting, which includes using toliet, bedpan, or urinal?: A Little Help from another person bathing (including washing, rinsing, drying)?: A Little Help from another person to put on and taking off regular upper body clothing?: A Little Help from another person to put on and taking off regular lower body clothing?: A Little 6 Click Score: 19    End of Session Equipment Utilized During Treatment: Rolling walker  OT Visit Diagnosis: Unsteadiness on feet (R26.81);Other abnormalities of gait and mobility (R26.89);Muscle weakness (generalized) (M62.81);Other symptoms and signs involving cognitive function;Pain   Activity Tolerance Patient tolerated treatment well   Patient Left in chair;with call bell/phone within reach   Nurse Communication Mobility status        Time: [id number] OT Time Calculation (min): 16 min  Charges: OT General Charges $OT Visit: 1 Visit OT Treatments $Self Care/Home Management : 8-22 mins  Jolaine Artist, OT Acute Rehabilitation Services Pager [phone] [place] 10/04/2020, 10:13 AM

## 2020-10-04 NOTE — Progress Notes (Signed)
Progress Note  Patient Name: Jodi Bell Date of Encounter: 10/04/2020  [facility] Cardiologist: Pixie Casino, MD   Subjective   Bleeding has stopped since last evaluation.  N  Inpatient Medications    Scheduled Meds:  allopurinol  100 mg Oral QPM   amiodarone  200 mg Oral BID   amLODipine  10 mg Oral Daily   chlorhexidine  15 mL Mouth Rinse BID   Chlorhexidine Gluconate Cloth  6 each Topical Q0600   cholecalciferol  1,000 Units Oral QPM   cloNIDine  0.1 mg Oral BID   darbepoetin (ARANESP) injection - DIALYSIS  200 mcg Intravenous Q Wed-HD   docusate sodium  100 mg Oral BID   feeding supplement  237 mL Oral TID BM   fluticasone  1 spray Each Nare Daily   folic acid  1 mg Oral Daily   Gerhardt's butt cream   Topical BID   hydrALAZINE  100 mg Oral Q8H   hydrocortisone  25 mg Rectal BID   irbesartan  300 mg Oral Daily   isosorbide mononitrate  30 mg Oral Daily   labetalol  200 mg Oral TID   magnesium oxide  200 mg Oral Daily   mouth rinse  15 mL Mouth Rinse q12n4p   montelukast  10 mg Oral Q supper   multivitamin  1 tablet Oral QHS   pantoprazole  40 mg Oral Daily   polyvinyl alcohol  1 drop Both Eyes TID AC & HS   predniSONE  5 mg Oral Q breakfast   saccharomyces boulardii  250 mg Oral Daily   sodium chloride flush  3 mL Intravenous Q12H   umeclidinium bromide  1 puff Inhalation Daily   Continuous Infusions:  sodium chloride     sodium chloride     sodium chloride 10 mL/hr at 10/02/20 0904   PRN Meds: sodium chloride, sodium chloride, acetaminophen, albuterol, alteplase, gabapentin, heparin, heparin, heparin, lidocaine-prilocaine, LORazepam, ondansetron (ZOFRAN) IV, oxyCODONE, pentafluoroprop-tetrafluoroeth, sodium chloride flush, sorbitol   Vital Signs    Vitals:   10/03/20 2254 10/03/20 2328 10/04/20 0321 10/04/20 0814  BP:  (!) 141/65 (!) 155/65 (!) 145/69  Pulse: 74 73 74 81  Resp: '18 12 20 20  '$ Temp:  97.9 F (36.6 C) 98.7 F (37.1 C) 98.8  F (37.1 C)  TempSrc:  Axillary Oral Oral  SpO2: 100% 100% 98% 100%  Weight:   65.2 kg   Height:        Intake/Output Summary (Last 24 hours) at 10/04/2020 0838 Last data filed at 10/04/2020 0814 Gross per 24 hour  Intake 120 ml  Output 3000 ml  Net -2880 ml   Last 3 Weights 10/04/2020 10/03/2020 10/03/2020  Weight (lbs) 143 lb 11.8 oz 141 lb 1.5 oz 147 lb 11.3 oz  Weight (kg) 65.2 kg 64 kg 67 kg      Telemetry    SR with artifact - Personally Reviewed  ECG    No new tracings - Personally Reviewed  Physical Exam   GEN: No acute distress.   Neck: No JVD Cardiac: RRR, no murmurs, rubs, or gallops.  Referred murmur from AV fistula Respiratory: Clear to auscultation bilaterally. GI: Soft, nontender, non-distended  MS: No edema; No deformity.  Neuro:  Nonfocal  Psych: Normal affect   Labs    High Sensitivity Troponin:   Recent Labs  Lab 09/08/20 1221 09/08/20 1430 09/08/20 1800 09/09/20 0605  TROPONINIHS 40* 126* 330* 1,499*      Chemistry  Recent Labs  Lab 09/28/20 0822 10/01/20 0751 10/02/20 0735 10/03/20 0806  NA 130* 129* 131* 130*  K 3.8 5.2* 3.7 4.0  CL 94* 96* 96* 96*  CO2 '28 26 28 26  '$ GLUCOSE 86 91 88 83  BUN 19 27* 15 18  CREATININE 4.49* 5.11* 3.59* 4.52*  CALCIUM 9.0 8.8* 7.7* 8.3*  ALBUMIN 1.7* 1.7*  --  1.8*  GFRNONAA 11* 9* 14* 11*  ANIONGAP '8 7 7 8     '$ Hematology Recent Labs  Lab 10/02/20 0735 10/03/20 0806 10/04/20 0550  WBC 5.1 5.7 4.5  RBC 2.54* 2.58* 2.42*  HGB 7.1* 7.3* 7.0*  HCT 22.7* 23.4* 21.9*  MCV 89.4 90.7 90.5  MCH 28.0 28.3 28.9  MCHC 31.3 31.2 32.0  RDW 21.2* 21.3* 21.3*  PLT 175 210 169    BNPNo results for input(s): BNP, PROBNP in the last 168 hours.   DDimer No results for input(s): DDIMER in the last 168 hours.   Radiology    No results found.  Cardiac Studies   Echo 09/09/20: 1. Left ventricular ejection fraction, by estimation, is 60%. The left  ventricle has normal function. The left ventricle  are septal wall motion  abnormalities described below. There is mild concentric left ventricular  hypertrophy with severe hypetrophy  of the basal-septal segment. Left ventricular diastolic parameters are  indeterminate in the setting of significant mitral annular calcification  but suggestive of increase left atrial pressure.   2. Right ventricular systolic function is normal. The right ventricular  size is normal. There is normal pulmonary artery systolic pressure.   3. A small pericardial effusion is present. The pericardial effusion is  circumferential.   4. The mitral valve is degenerative. Mild mitral valve regurgitation.  Severe mitral annular calcification.   5. The aortic valve is tricuspid. There is mild calcification of the  aortic valve. There is moderate thickening of the aortic valve. Aortic  valve regurgitation is not visualized. Mild aortic valve stenosis. Aortic  valve mean gradient measures 24.0 mmHg.   Aortic valve Vmax measures 3.26 m/s.   6. Left atrial size was severely dilated.   Patient Profile     60 y.o. 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 Heart cath showed multivessel disease leading to CABG, complicated by post op Afib and GI bleed.  Assessment & Plan    CAD s/p CABG GI bleed - CT surgery following - not on antiplatelets given complicated GI bleed course; per GI would not rechallenge until at least 10/14/20; will  defer 81 mg PO ASA re-challenge or later (likely as outpatient_  Post op Afib - maintaining sinus rhythm on PO amiodarone 200 mg PO Daily maitenance - no anticoagulation given GI bleed and no recurrence   Hypertension - labetalol, amlodipine, imdur, and clonidine BID - pt seen in HD, BP controlled but appears to fluctuate    ESRD on HD - per nephrology   [facility] will sign off.   Medication Recommendations:  Amiodarone 200 mg PO Daily, Labetalol (PM  dose has been held several times lately- this may need to only be BID), Clonidine, Imdur 30 mg PO daily, patient previously on amlodipine 10 mg PO Daily that could be restarted if BP is elevated as outpatient Other recommendations (labs, testing, etc):  NA Follow up as an outpatient:  10/22/20  For questions or updates, please contact [place] Please consult [email or web address] for contact info under Cardiology/STEMI.      Signed, Werner Lean, MD  10/04/2020, 8:41 AM

## 2020-10-04 NOTE — TOC Transition Note (Signed)
Transition of Care (TOC) - CM/SW Discharge Note Marvetta Gibbons RN, BSN Transitions of Care Unit 4E- RN Case Manager See Treatment Team for direct phone #    Patient Details  Name: Jodi Bell MRN: [id number] Date of Birth: [birth year]  Transition of Care [facility]) CM/SW Contact:  Dawayne Patricia, RN Phone Number: 10/04/2020, 1:22 PM   Clinical Narrative:    Pt stable for transition home today, Orders have been placed for HHPT/OT, call made to Stacy with [facility] to confirm referral has been accepted. Marzetta Board to follow up with daughter for [facility] start of care.  Rollator has been delivered to room by Grenelefe.   Daughter to transport home.    Final next level of care: [place] Barriers to Discharge: Continued Medical Work up   Patient Goals and CMS Choice Patient states their goals for this hospitalization and ongoing recovery are:: to go home CMS Medicare.gov Compare Post Acute Care list provided to:: Patient Choice offered to / list presented to : Patient  Discharge Placement               Home with [facility]        Discharge Plan and Services In-house Referral: Clinical Social Work Discharge Planning Services: CM Consult Post Acute Care Choice: Home Health, Durable Medical Equipment          DME Arranged: Walker rolling with seat DME Agency:  Celesta Aver) Date DME Agency Contacted: 10/02/20 Time DME Agency Contacted: [phone] Representative spoke with at DME Agency: Brenton Grills HH Arranged: PT [place]: [place] Date [place]: 10/03/20 Time [place]: 1130 Representative spoke with at [place]: [place] ([place]) Interventions     Readmission Risk Interventions No flowsheet data found.

## 2020-10-04 NOTE — Progress Notes (Addendum)
[address]       [place], [address]             [phone]      2 Days Post-Op Procedure(s) (LRB): ESOPHAGOGASTRODUODENOSCOPY (EGD) WITH PROPOFOL (N/A) COLONOSCOPY WITH PROPOFOL (N/A) Subjective: Feels ok, no abdominal sx or further bleeding  Objective: Vital signs in last 24 hours: Temp:  [97.7 F (36.5 C)-98.7 F (37.1 C)] 98.7 F (37.1 C) (08/25 0321) Pulse Rate:  [73-81] 74 (08/25 0321) Cardiac Rhythm: Heart block (08/24 1956) Resp:  [12-20] 20 (08/25 0321) BP: (131-159)/(61-85) 155/65 (08/25 0321) SpO2:  [98 %-100 %] 98 % (08/25 0321) Weight:  [id number] kg-67 kg] 65.2 kg (08/25 0321)  Hemodynamic parameters for last 24 hours:    Intake/Output from previous day: 08/24 0701 - 08/25 0700 In: -  Out: 3000  Intake/Output this shift: No intake/output data recorded.  General appearance: alert, cooperative, and no distress Heart: regular rate and rhythm Lungs: min dim left base, overall clear Abdomen: benign exam Extremities: LUE edema slightly improved, no LE edema Wound: healing well without signs of infection  Lab Results: Recent Labs    10/03/20 0806 10/04/20 0550  WBC 5.7 4.5  HGB 7.3* 7.0*  HCT 23.4* 21.9*  PLT 210 169   BMET:  Recent Labs    10/02/20 0735 10/03/20 0806  NA 131* 130*  K 3.7 4.0  CL 96* 96*  CO2 28 26  GLUCOSE 88 83  BUN 15 18  CREATININE 3.59* 4.52*  CALCIUM 7.7* 8.3*    PT/INR: No results for input(s): LABPROT, INR in the last 72 hours. ABG    Component Value Date/Time   PHART 7.439 09/17/2020 1014   HCO3 29.6 (H) 09/17/2020 1014   TCO2 31 09/17/2020 1014   ACIDBASEDEF 1.0 09/13/2020 2152   O2SAT 91.0 09/17/2020 1014   CBG (last 3)  No results for input(s): GLUCAP in the last 72 hours.  Meds Scheduled Meds:  allopurinol  100 mg Oral QPM   amiodarone  200 mg Oral BID   amLODipine  10 mg Oral Daily   chlorhexidine  15 mL Mouth Rinse BID   Chlorhexidine Gluconate Cloth  6 each Topical Q0600    cholecalciferol  1,000 Units Oral QPM   cloNIDine  0.1 mg Oral BID   darbepoetin (ARANESP) injection - DIALYSIS  200 mcg Intravenous Q Wed-HD   docusate sodium  100 mg Oral BID   feeding supplement  237 mL Oral TID BM   fluticasone  1 spray Each Nare Daily   folic acid  1 mg Oral Daily   Gerhardt's butt cream   Topical BID   hydrALAZINE  100 mg Oral Q8H   hydrocortisone  25 mg Rectal BID   irbesartan  300 mg Oral Daily   isosorbide mononitrate  30 mg Oral Daily   labetalol  200 mg Oral TID   magnesium oxide  200 mg Oral Daily   mouth rinse  15 mL Mouth Rinse q12n4p   montelukast  10 mg Oral Q supper   multivitamin  1 tablet Oral QHS   pantoprazole  40 mg Oral Daily   polyvinyl alcohol  1 drop Both Eyes TID AC & HS   predniSONE  5 mg Oral Q breakfast   saccharomyces boulardii  250 mg Oral Daily   sodium chloride flush  3 mL Intravenous Q12H   umeclidinium bromide  1 puff Inhalation Daily   Continuous Infusions:  sodium chloride  sodium chloride     sodium chloride 10 mL/hr at 10/02/20 0904   PRN Meds:.sodium chloride, sodium chloride, acetaminophen, albuterol, alteplase, gabapentin, heparin, heparin, heparin, lidocaine-prilocaine, LORazepam, ondansetron (ZOFRAN) IV, oxyCODONE, pentafluoroprop-tetrafluoroeth, sodium chloride flush, sorbitol  Xrays No results found.  Assessment/Plan: S/P Procedure(s) (LRB): ESOPHAGOGASTRODUODENOSCOPY (EGD) WITH PROPOFOL (N/A) COLONOSCOPY WITH PROPOFOL (N/A)   1 afeb, VSS , BP control pretty good overall 2 sats good on RA 3 sinus rhythm 4 H/H pretty stable at 7.0 and 21.9- no further bleeding - on aranesp, ferritin level 1000, TIBC 136, defer management to renal. GI has signed off and left recs.- cont protonix, no ASA for now. Only heparin with dialysis for ACRx  for now.  5 appears stable for d/c     LOS: 26 days    [name] PA-C Pager [phone] 10/04/2020    Chart reviewed, patient examined, agree with above. She feels  ok and no further bleeding. Hgb is stable on the low end but BP is good and she is on iron. I think she can go home today.

## 2020-10-05 ENCOUNTER — Telehealth (HOSPITAL_COMMUNITY): Payer: Self-pay

## 2020-10-05 NOTE — Telephone Encounter (Signed)
Per phase I cardiac rehab, fax cardiac rehab referral to [place] [facility]. 

## 2020-10-09 ENCOUNTER — Ambulatory Visit: Payer: Medicare PPO | Admitting: Medical

## 2020-10-16 ENCOUNTER — Other Ambulatory Visit: Payer: Self-pay | Admitting: Surgery

## 2020-10-16 DIAGNOSIS — Z951 Presence of aortocoronary bypass graft: Secondary | ICD-10-CM

## 2020-10-17 ENCOUNTER — Ambulatory Visit: Payer: Self-pay | Admitting: Physician Assistant

## 2020-10-17 ENCOUNTER — Ambulatory Visit: Payer: Self-pay | Admitting: Surgery

## 2020-10-22 ENCOUNTER — Ambulatory Visit: Payer: Medicare PPO | Admitting: Physician Assistant

## 2020-10-26 ENCOUNTER — Other Ambulatory Visit: Payer: Self-pay | Admitting: Surgical

## 2020-11-07 ENCOUNTER — Encounter: Payer: Self-pay | Admitting: Surgery

## 2020-11-07 ENCOUNTER — Ambulatory Visit
Admission: RE | Admit: 2020-11-07 | Discharge: 2020-11-07 | Disposition: A | Discharge status: Home / Self Care | Payer: Medicare PPO | Source: Ambulatory Visit | Attending: Surgery | Admitting: Surgery

## 2020-11-07 ENCOUNTER — Other Ambulatory Visit: Payer: Self-pay

## 2020-11-07 ENCOUNTER — Ambulatory Visit (INDEPENDENT_AMBULATORY_CARE_PROVIDER_SITE_OTHER): Payer: Medicare PPO | Admitting: Surgery

## 2020-11-07 VITALS — BP 160/70 | HR 80 | Resp 20 | Ht 62.0 in | Wt 139.0 lb

## 2020-11-07 DIAGNOSIS — Z951 Presence of aortocoronary bypass graft: Secondary | ICD-10-CM

## 2020-11-11 NOTE — Progress Notes (Signed)
HPI: Patient returns for routine postoperative follow-up having undergone coronary bypass graft surgery x2 on 09/13/2020. The patient's early postoperative recovery while in the hospital was notable for development of an event concerning for seizure activity and possible stroke on postoperative day 2.  A code stroke was called and CT scan of the brain was negative for acute abnormality.  An EEG was negative.  She recovered and had no further events.  Recovery was somewhat slow due to end-stage renal disease on hemodialysis.  She also developed some swelling of her left upper extremity and left breast.  Left upper extremity Doppler examination was negative for DVT.  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  She had repeat GI bleeding with bright red blood per rectum and GI was reconsulted.  She underwent EGD and colonoscopy on 10/02/2020 which were both negative for active bleeding.  Colonoscopy did show inflamed and eroded internal hemorrhoids which were felt to be the possible source.  She also had diffuse colonic diverticuli but no signs of bleeding from those.  She also had postoperative atrial fibrillation and was converted with amiodarone.  She continued to improve and was discharged home. Since hospital discharge the patient reports that she has been getting stronger.  She has been going to dialysis and doing well.  She denies any chest pain or pressure.  She has had no shortness of breath.   Current Outpatient Medications  Medication Sig Dispense Refill   acetaminophen (TYLENOL) 500 MG tablet Take 1,000 mg by mouth every 6 (six) hours as needed for mild pain.     allopurinol (ZYLOPRIM) 100 MG tablet Take 100 mg by mouth every evening.     amiodarone (PACERONE) 200 MG  tablet Take 1 tablet (200 mg total) by mouth daily. 30 tablet 1   amLODipine (NORVASC) 10 MG tablet Take 10 mg by mouth daily.     carboxymethylcellulose 1 % ophthalmic solution Place 1 drop into both eyes 4 (four) times daily.     cholecalciferol (VITAMIN D) 25 MCG tablet Take 1 tablet (1,000 Units total) by mouth every evening. 30 tablet 1   cloNIDine (CATAPRES) 0.1 MG tablet Take 1 tablet (0.1 mg total) by mouth 2 (two) times daily. 60 tablet 1   Darbepoetin Alfa (ARANESP) 200 MCG/0.4ML SOSY injection Inject 0.4 mLs (200 mcg total) into the vein every Wednesday with hemodialysis. 1.68 mL    EPINEPHrine (EPIPEN) 0.3 mg/0.3 mL SOAJ injection Inject 0.3 mLs (0.3 mg total) into the muscle once. (Patient taking differently: Inject 0.3 mg into the muscle as needed (for allergic reaction).) 2 Device 1   feeding supplement (ENSURE ENLIVE / ENSURE PLUS) LIQD Take 237 mLs by mouth 3 (three) times daily between meals. 21330 mL 2   fluticasone (FLONASE) 50 MCG/ACT nasal spray Place 1 spray into both nostrils daily.     folic acid (FOLVITE) 1 MG tablet Take 1 tablet (1 mg total) by mouth daily. 30 tablet 1   heparin 1000 unit/mL SOLN injection 3 mLs (3,000 Units total) by Dialysis route as needed (in dialysis as needed).     heparin 1000 unit/mL SOLN injection 2 mLs (2,000 Units total) by Dialysis route as needed (in dialysis as needed).     heparin 1000 unit/mL SOLN injection  1 mL (1,000 Units total) by Dialysis route as needed (in dialysis).     hydrALAZINE (APRESOLINE) 100 MG tablet Take 1 tablet (100 mg total) by mouth every 8 (eight) hours. 90 tablet 0   hydrocortisone (ANUSOL-HC) 25 MG suppository Place 1 suppository (25 mg total) rectally 2 (two) times daily. 12 suppository 1   isosorbide mononitrate (IMDUR) 30 MG 24 hr tablet Take 1 tablet (30 mg total) by mouth daily. 30 tablet 1   labetalol (NORMODYNE) 200 MG tablet Take 1 tablet (200 mg total) by mouth 3 (three) times daily. 90 tablet 1    lidocaine-prilocaine (EMLA) cream Apply 1 application topically as needed (topical anesthesia for hemodialysis if Gebauers and Lidocaine injection are ineffective.). 30 g 0   LORazepam (ATIVAN) 1 MG tablet Take 1 mg by mouth every 8 (eight) hours as needed for anxiety.     montelukast (SINGULAIR) 10 MG tablet Take 10 mg by mouth daily with supper.      multivitamin (RENA-VIT) TABS tablet Take 1 tablet by mouth daily with supper.     olmesartan (BENICAR) 40 MG tablet Take 40 mg by mouth every evening.     pantoprazole (PROTONIX) 40 MG tablet Take 40 mg by mouth daily.     pentafluoroprop-tetrafluoroeth (GEBAUERS) AERO Apply 1 application topically as needed (topical anesthesia for hemodialysis).  0   predniSONE (DELTASONE) 5 MG tablet Take 1 tablet (5 mg total) by mouth daily with breakfast. 30 tablet 0   Probiotic Product (PROBIOTIC PO) Take 1 capsule by mouth daily.     sorbitol 70 % SOLN Take 30 mLs by mouth daily as needed for moderate constipation. 473 mL 1   Tiotropium Bromide Monohydrate (SPIRIVA RESPIMAT IN) Inhale 2 puffs into the lungs daily.     VENTOLIN HFA 108 (90 Base) MCG/ACT inhaler Inhale 1-2 puffs into the lungs every 6 (six) hours as needed for shortness of breath. wheezing     gabapentin (NEURONTIN) 300 MG capsule Take 1 capsule (300 mg total) by mouth at bedtime as needed (Neuropathic pain). (Patient taking differently: Take 300 mg by mouth as needed (pain).) 30 capsule 2   No current facility-administered medications for this visit.    Physical Exam: BP (!) 160/70   Pulse 80   Resp 20   Ht '5\' 2"'$  (1.575 m)   Wt 139 lb (63 kg)   SpO2 95% Comment: RA  BMI 25.42 kg/m  She looks well. Cardiac exam shows regular rate and rhythm with normal heart sounds.  There is no murmur. Lungs are clear The chest incision is healing well and the sternum is stable. There is no peripheral edema.  Diagnostic Tests:  Narrative & Impression  CLINICAL DATA:  s/p cabg x 2    EXAM: CHEST - 2 VIEW   COMPARISON:  None.   FINDINGS: The heart is enlarged. No pleural effusion. No pneumothorax. No mass or consolidation. Median sternotomy wires.   IMPRESSION: Cardiomegaly.  No acute process in the chest.     Electronically Signed   By: Albin Felling M.D.   On: 11/07/2020 16:51      Impression:  Overall I think she is making good progress almost 2 months out from her surgery.  I told her that she could return to driving but should refrain lifting anything heavier than 10 pounds for 3 months postoperatively.  She has not returned to see cardiology yet and I encouraged her to make an appointment to do so.  Plan:  She will  continue to follow-up with her PCP and cardiology and will return to see me if she has any problems with her incisions.  Gaye Pollack, MD [facility] [phone]

## 2020-11-16 ENCOUNTER — Other Ambulatory Visit: Payer: Self-pay | Admitting: Physician Assistant

## 2020-11-16 DIAGNOSIS — R112 Nausea with vomiting, unspecified: Secondary | ICD-10-CM

## 2020-11-24 ENCOUNTER — Other Ambulatory Visit: Payer: Self-pay | Admitting: Surgical

## 2020-11-29 ENCOUNTER — Ambulatory Visit
Admission: RE | Admit: 2020-11-29 | Discharge: 2020-11-29 | Disposition: A | Discharge status: Home / Self Care | Payer: Medicare PPO | Source: Ambulatory Visit | Attending: Physician Assistant | Admitting: Physician Assistant

## 2020-11-29 DIAGNOSIS — R112 Nausea with vomiting, unspecified: Secondary | ICD-10-CM

## 2020-12-01 ENCOUNTER — Other Ambulatory Visit: Payer: Self-pay | Admitting: Surgical

## 2020-12-04 ENCOUNTER — Other Ambulatory Visit: Payer: Self-pay | Admitting: Surgical

## 2020-12-08 ENCOUNTER — Other Ambulatory Visit: Payer: Self-pay | Admitting: Surgical

## 2020-12-11 ENCOUNTER — Other Ambulatory Visit: Payer: Self-pay | Admitting: *Deleted

## 2020-12-11 ENCOUNTER — Other Ambulatory Visit: Payer: Self-pay | Admitting: Surgery

## 2020-12-11 MED ORDER — ISOSORBIDE MONONITRATE ER 30 MG PO TB24: 30.0000 mg | ORAL_TABLET | Freq: Every day | ORAL | 1 refills | Status: DC

## 2020-12-11 MED ORDER — AMIODARONE HCL 200 MG PO TABS: 200.0000 mg | ORAL_TABLET | Freq: Every day | ORAL | 1 refills | Status: DC

## 2020-12-12 ENCOUNTER — Other Ambulatory Visit: Payer: Self-pay | Admitting: Surgical

## 2021-01-02 ENCOUNTER — Other Ambulatory Visit: Payer: Self-pay

## 2021-01-02 ENCOUNTER — Encounter (HOSPITAL_BASED_OUTPATIENT_CLINIC_OR_DEPARTMENT_OTHER): Payer: Self-pay

## 2021-01-02 ENCOUNTER — Observation Stay (HOSPITAL_BASED_OUTPATIENT_CLINIC_OR_DEPARTMENT_OTHER)
Admission: EM | Admit: 2021-01-02 | Discharge: 2021-01-03 | Disposition: A | Discharge status: Home / Self Care | Payer: Medicare PPO | Attending: Internal Medicine | Admitting: Internal Medicine

## 2021-01-02 ENCOUNTER — Emergency Department (HOSPITAL_BASED_OUTPATIENT_CLINIC_OR_DEPARTMENT_OTHER): Payer: Medicare PPO

## 2021-01-02 ENCOUNTER — Observation Stay (HOSPITAL_BASED_OUTPATIENT_CLINIC_OR_DEPARTMENT_OTHER): Payer: Medicare PPO

## 2021-01-02 DIAGNOSIS — I2510 Atherosclerotic heart disease of native coronary artery without angina pectoris: Secondary | ICD-10-CM | POA: Diagnosis not present

## 2021-01-02 DIAGNOSIS — I5031 Acute diastolic (congestive) heart failure: Secondary | ICD-10-CM | POA: Diagnosis not present

## 2021-01-02 DIAGNOSIS — J449 Chronic obstructive pulmonary disease, unspecified: Secondary | ICD-10-CM | POA: Diagnosis present

## 2021-01-02 DIAGNOSIS — I5032 Chronic diastolic (congestive) heart failure: Secondary | ICD-10-CM

## 2021-01-02 DIAGNOSIS — I509 Heart failure, unspecified: Secondary | ICD-10-CM

## 2021-01-02 DIAGNOSIS — Z79899 Other long term (current) drug therapy: Secondary | ICD-10-CM | POA: Diagnosis not present

## 2021-01-02 DIAGNOSIS — Z951 Presence of aortocoronary bypass graft: Secondary | ICD-10-CM | POA: Diagnosis not present

## 2021-01-02 DIAGNOSIS — E1122 Type 2 diabetes mellitus with diabetic chronic kidney disease: Secondary | ICD-10-CM | POA: Diagnosis not present

## 2021-01-02 DIAGNOSIS — R0602 Shortness of breath: Secondary | ICD-10-CM | POA: Diagnosis present

## 2021-01-02 DIAGNOSIS — Z87891 Personal history of nicotine dependence: Secondary | ICD-10-CM | POA: Insufficient documentation

## 2021-01-02 DIAGNOSIS — Z992 Dependence on renal dialysis: Secondary | ICD-10-CM

## 2021-01-02 DIAGNOSIS — D631 Anemia in chronic kidney disease: Secondary | ICD-10-CM | POA: Insufficient documentation

## 2021-01-02 DIAGNOSIS — Z96652 Presence of left artificial knee joint: Secondary | ICD-10-CM | POA: Diagnosis not present

## 2021-01-02 DIAGNOSIS — I5033 Acute on chronic diastolic (congestive) heart failure: Principal | ICD-10-CM

## 2021-01-02 DIAGNOSIS — J45909 Unspecified asthma, uncomplicated: Secondary | ICD-10-CM | POA: Diagnosis not present

## 2021-01-02 DIAGNOSIS — J42 Unspecified chronic bronchitis: Secondary | ICD-10-CM

## 2021-01-02 DIAGNOSIS — J441 Chronic obstructive pulmonary disease with (acute) exacerbation: Secondary | ICD-10-CM | POA: Diagnosis present

## 2021-01-02 DIAGNOSIS — Z20822 Contact with and (suspected) exposure to covid-19: Secondary | ICD-10-CM | POA: Diagnosis not present

## 2021-01-02 DIAGNOSIS — I10 Essential (primary) hypertension: Secondary | ICD-10-CM | POA: Diagnosis not present

## 2021-01-02 DIAGNOSIS — N186 End stage renal disease: Secondary | ICD-10-CM | POA: Diagnosis not present

## 2021-01-02 DIAGNOSIS — E119 Type 2 diabetes mellitus without complications: Secondary | ICD-10-CM

## 2021-01-02 DIAGNOSIS — I132 Hypertensive heart and chronic kidney disease with heart failure and with stage 5 chronic kidney disease, or end stage renal disease: Secondary | ICD-10-CM | POA: Diagnosis not present

## 2021-01-02 LAB — CBC WITH DIFFERENTIAL/PLATELET
Abs Immature Granulocytes: 0.01 10*3/uL (ref 0.00–0.07)
Basophils Absolute: 0 10*3/uL (ref 0.0–0.1)
Basophils Relative: 1 %
Eosinophils Absolute: 0.3 10*3/uL (ref 0.0–0.5)
Eosinophils Relative: 8 %
HCT: 31.9 % — ABNORMAL LOW (ref 36.0–46.0)
Hemoglobin: 9.9 g/dL — ABNORMAL LOW (ref 12.0–15.0)
Immature Granulocytes: 0 %
Lymphocytes Relative: 22 %
Lymphs Abs: 0.7 10*3/uL (ref 0.7–4.0)
MCH: 31.6 pg (ref 26.0–34.0)
MCHC: 31 g/dL (ref 30.0–36.0)
MCV: 101.9 fL — ABNORMAL HIGH (ref 80.0–100.0)
Monocytes Absolute: 0.3 10*3/uL (ref 0.1–1.0)
Monocytes Relative: 10 %
Neutro Abs: 1.9 10*3/uL (ref 1.7–7.7)
Neutrophils Relative %: 59 %
Platelets: 124 10*3/uL — ABNORMAL LOW (ref 150–400)
RBC: 3.13 MIL/uL — ABNORMAL LOW (ref 3.87–5.11)
RDW: 17 % — ABNORMAL HIGH (ref 11.5–15.5)
WBC: 3.3 10*3/uL — ABNORMAL LOW (ref 4.0–10.5)
nRBC: 0 % (ref 0.0–0.2)

## 2021-01-02 LAB — CBC
HCT: 32.6 % — ABNORMAL LOW (ref 36.0–46.0)
Hemoglobin: 10.3 g/dL — ABNORMAL LOW (ref 12.0–15.0)
MCH: 32 pg (ref 26.0–34.0)
MCHC: 31.6 g/dL (ref 30.0–36.0)
MCV: 101.2 fL — ABNORMAL HIGH (ref 80.0–100.0)
Platelets: 127 10*3/uL — ABNORMAL LOW (ref 150–400)
RBC: 3.22 MIL/uL — ABNORMAL LOW (ref 3.87–5.11)
RDW: 16.7 % — ABNORMAL HIGH (ref 11.5–15.5)
WBC: 3.1 10*3/uL — ABNORMAL LOW (ref 4.0–10.5)
nRBC: 0 % (ref 0.0–0.2)

## 2021-01-02 LAB — COMPREHENSIVE METABOLIC PANEL
ALT: 13 U/L (ref 0–44)
AST: 19 U/L (ref 15–41)
Albumin: 3.9 g/dL (ref 3.5–5.0)
Alkaline Phosphatase: 161 U/L — ABNORMAL HIGH (ref 38–126)
Anion gap: 9 (ref 5–15)
BUN: 17 mg/dL (ref 6–20)
CO2: 31 mmol/L (ref 22–32)
Calcium: 10.2 mg/dL (ref 8.9–10.3)
Chloride: 98 mmol/L (ref 98–111)
Creatinine, Ser: 4.86 mg/dL — ABNORMAL HIGH (ref 0.44–1.00)
GFR, Estimated: 10 mL/min — ABNORMAL LOW (ref >60)
Glucose, Bld: 88 mg/dL (ref 70–99)
Potassium: 4.7 mmol/L (ref 3.5–5.1)
Sodium: 138 mmol/L (ref 135–145)
Total Bilirubin: 0.5 mg/dL (ref 0.3–1.2)
Total Protein: 6.3 g/dL — ABNORMAL LOW (ref 6.5–8.1)

## 2021-01-02 LAB — RESP PANEL BY RT-PCR (FLU A&B, COVID) ARPGX2
Influenza A by PCR: NEGATIVE
Influenza B by PCR: NEGATIVE
SARS Coronavirus 2 by RT PCR: NEGATIVE

## 2021-01-02 LAB — CREATININE, SERUM
Creatinine, Ser: 5.69 mg/dL — ABNORMAL HIGH (ref 0.44–1.00)
GFR, Estimated: 8 mL/min — ABNORMAL LOW (ref >60)

## 2021-01-02 LAB — ECHOCARDIOGRAM COMPLETE
AR max vel: 1.11 cm2
AV Area VTI: 1.16 cm2
AV Area mean vel: 1.08 cm2
AV Mean grad: 23 mmHg
AV Peak grad: 42 mmHg
Ao pk vel: 3.24 m/s
Area-P 1/2: 3.42 cm2
Height: 62 in
S' Lateral: 2.6 cm
Weight: 2185.6 oz

## 2021-01-02 LAB — HEPATITIS B SURFACE ANTIGEN: Hepatitis B Surface Ag: NONREACTIVE

## 2021-01-02 LAB — TROPONIN I (HIGH SENSITIVITY)
Troponin I (High Sensitivity): 17 ng/L (ref <18)
Troponin I (High Sensitivity): 14 ng/L (ref <18)

## 2021-01-02 LAB — HEPATITIS B SURFACE ANTIBODY,QUALITATIVE: Hep B S Ab: REACTIVE — AB

## 2021-01-02 LAB — MAGNESIUM: Magnesium: <0.5 mg/dL — CL (ref 1.7–2.4)

## 2021-01-02 LAB — BRAIN NATRIURETIC PEPTIDE: B Natriuretic Peptide: >4500 pg/mL — ABNORMAL HIGH (ref 0.0–100.0)

## 2021-01-02 MED ORDER — MAGNESIUM SULFATE 2 GM/50ML IV SOLN
2.0000 g | INTRAVENOUS | Status: AC
  Administered 2021-01-02: 2 g via INTRAVENOUS

## 2021-01-02 MED ORDER — HYDRALAZINE HCL 50 MG PO TABS
100.0000 mg | ORAL_TABLET | Freq: Three times a day (TID) | ORAL | Status: DC
  Administered 2021-01-03: 100 mg via ORAL
  Filled 2021-01-02: qty 2

## 2021-01-02 MED ORDER — NITROGLYCERIN IN D5W 200-5 MCG/ML-% IV SOLN
0.0000 ug/min | INTRAVENOUS | Status: DC
  Filled 2021-01-02: qty 250

## 2021-01-02 MED ORDER — PENTAFLUOROPROP-TETRAFLUOROETH EX AERO: 1.0000 "application " | INHALATION_SPRAY | CUTANEOUS | Status: DC | PRN

## 2021-01-02 MED ORDER — ACETAMINOPHEN 325 MG PO TABS
650.0000 mg | ORAL_TABLET | Freq: Four times a day (QID) | ORAL | Status: DC | PRN
  Administered 2021-01-02: 650 mg via ORAL
  Filled 2021-01-02: qty 2

## 2021-01-02 MED ORDER — SODIUM CHLORIDE 0.9 % IV SOLN: 100.0000 mL | INTRAVENOUS | Status: DC | PRN

## 2021-01-02 MED ORDER — IOHEXOL 350 MG/ML SOLN
100.0000 mL | Freq: Once | INTRAVENOUS | Status: AC | PRN
  Administered 2021-01-02: 75 mL via INTRAVENOUS

## 2021-01-02 MED ORDER — FUROSEMIDE 10 MG/ML IJ SOLN: 60.0000 mg | Freq: Every day | INTRAMUSCULAR | Status: DC

## 2021-01-02 MED ORDER — LABETALOL HCL 200 MG PO TABS
200.0000 mg | ORAL_TABLET | Freq: Three times a day (TID) | ORAL | Status: DC
  Administered 2021-01-02 – 2021-01-03 (×3): 200 mg via ORAL
  Filled 2021-01-02 (×3): qty 1

## 2021-01-02 MED ORDER — CARBOXYMETHYLCELLULOSE SODIUM 1 % OP SOLN: 1.0000 [drp] | Freq: Four times a day (QID) | OPHTHALMIC | Status: DC

## 2021-01-02 MED ORDER — HEPARIN SODIUM (PORCINE) 5000 UNIT/ML IJ SOLN
5000.0000 [IU] | Freq: Three times a day (TID) | INTRAMUSCULAR | Status: DC
  Administered 2021-01-02 – 2021-01-03 (×2): 5000 [IU] via SUBCUTANEOUS
  Filled 2021-01-02 (×2): qty 1

## 2021-01-02 MED ORDER — ACETAMINOPHEN 500 MG PO TABS
1000.0000 mg | ORAL_TABLET | Freq: Four times a day (QID) | ORAL | Status: DC | PRN
  Administered 2021-01-03: 1000 mg via ORAL
  Filled 2021-01-02: qty 2

## 2021-01-02 MED ORDER — IPRATROPIUM BROMIDE 0.02 % IN SOLN: 0.5000 mg | Freq: Four times a day (QID) | RESPIRATORY_TRACT | Status: DC

## 2021-01-02 MED ORDER — HEPARIN SODIUM (PORCINE) 1000 UNIT/ML DIALYSIS: 1000.0000 [IU] | INTRAMUSCULAR | Status: DC | PRN

## 2021-01-02 MED ORDER — LIDOCAINE-PRILOCAINE 2.5-2.5 % EX CREA: 1.0000 "application " | TOPICAL_CREAM | CUTANEOUS | Status: DC | PRN

## 2021-01-02 MED ORDER — MONTELUKAST SODIUM 10 MG PO TABS
10.0000 mg | ORAL_TABLET | Freq: Every day | ORAL | Status: DC
  Administered 2021-01-03: 10 mg via ORAL
  Filled 2021-01-02: qty 1

## 2021-01-02 MED ORDER — MAGNESIUM SULFATE 2 GM/50ML IV SOLN
2.0000 g | Freq: Once | INTRAVENOUS | Status: DC
  Filled 2021-01-02: qty 50

## 2021-01-02 MED ORDER — POLYVINYL ALCOHOL 1.4 % OP SOLN
1.0000 [drp] | Freq: Four times a day (QID) | OPHTHALMIC | Status: DC
  Filled 2021-01-02: qty 15

## 2021-01-02 MED ORDER — ACETAMINOPHEN 325 MG PO TABS
650.0000 mg | ORAL_TABLET | Freq: Once | ORAL | Status: AC
  Administered 2021-01-02: 650 mg via ORAL
  Filled 2021-01-02: qty 2

## 2021-01-02 MED ORDER — ALBUTEROL SULFATE (2.5 MG/3ML) 0.083% IN NEBU: 2.5000 mg | INHALATION_SOLUTION | Freq: Four times a day (QID) | RESPIRATORY_TRACT | Status: DC

## 2021-01-02 MED ORDER — PANTOPRAZOLE SODIUM 40 MG PO TBEC
40.0000 mg | DELAYED_RELEASE_TABLET | Freq: Every day | ORAL | Status: DC
  Administered 2021-01-03: 40 mg via ORAL
  Filled 2021-01-02: qty 1

## 2021-01-02 MED ORDER — AMLODIPINE BESYLATE 10 MG PO TABS
10.0000 mg | ORAL_TABLET | Freq: Every day | ORAL | Status: DC
  Administered 2021-01-03: 10 mg via ORAL
  Filled 2021-01-02 (×2): qty 1

## 2021-01-02 MED ORDER — SODIUM CHLORIDE 0.9% FLUSH: 3.0000 mL | Freq: Two times a day (BID) | INTRAVENOUS | Status: DC

## 2021-01-02 MED ORDER — ONDANSETRON HCL 4 MG/2ML IJ SOLN: 4.0000 mg | Freq: Four times a day (QID) | INTRAMUSCULAR | Status: DC | PRN

## 2021-01-02 MED ORDER — VITAMIN D 25 MCG (1000 UNIT) PO TABS: 1000.0000 [IU] | ORAL_TABLET | Freq: Every evening | ORAL | Status: DC

## 2021-01-02 MED ORDER — HYDRALAZINE HCL 25 MG PO TABS
100.0000 mg | ORAL_TABLET | Freq: Once | ORAL | Status: AC
  Administered 2021-01-02: 100 mg via ORAL
  Filled 2021-01-02: qty 4

## 2021-01-02 MED ORDER — ONDANSETRON HCL 4 MG PO TABS: 4.0000 mg | ORAL_TABLET | Freq: Four times a day (QID) | ORAL | Status: DC | PRN

## 2021-01-02 MED ORDER — AMIODARONE HCL 200 MG PO TABS
200.0000 mg | ORAL_TABLET | Freq: Every day | ORAL | Status: DC
  Administered 2021-01-02 – 2021-01-03 (×3): 200 mg via ORAL
  Filled 2021-01-02 (×3): qty 1

## 2021-01-02 MED ORDER — SENNOSIDES-DOCUSATE SODIUM 8.6-50 MG PO TABS: 1.0000 | ORAL_TABLET | Freq: Every evening | ORAL | Status: DC | PRN

## 2021-01-02 MED ORDER — FLUTICASONE PROPIONATE 50 MCG/ACT NA SUSP
1.0000 | Freq: Every day | NASAL | Status: DC
  Filled 2021-01-02: qty 16

## 2021-01-02 MED ORDER — SODIUM CHLORIDE 0.9% FLUSH: 3.0000 mL | INTRAVENOUS | Status: DC | PRN

## 2021-01-02 MED ORDER — FUROSEMIDE 10 MG/ML IJ SOLN
80.0000 mg | Freq: Once | INTRAMUSCULAR | Status: AC
  Administered 2021-01-02: 80 mg via INTRAVENOUS
  Filled 2021-01-02: qty 8

## 2021-01-02 MED ORDER — AMLODIPINE BESYLATE 5 MG PO TABS
10.0000 mg | ORAL_TABLET | Freq: Once | ORAL | Status: AC
  Administered 2021-01-02: 10 mg via ORAL
  Filled 2021-01-02: qty 2

## 2021-01-02 MED ORDER — LIDOCAINE HCL (PF) 1 % IJ SOLN: 5.0000 mL | INTRAMUSCULAR | Status: DC | PRN

## 2021-01-02 MED ORDER — IPRATROPIUM-ALBUTEROL 0.5-2.5 (3) MG/3ML IN SOLN
3.0000 mL | Freq: Four times a day (QID) | RESPIRATORY_TRACT | Status: DC
  Administered 2021-01-02 – 2021-01-03 (×2): 3 mL via RESPIRATORY_TRACT
  Filled 2021-01-02 (×4): qty 3

## 2021-01-02 MED ORDER — SODIUM CHLORIDE 0.9 % IV SOLN: 250.0000 mL | INTRAVENOUS | Status: DC | PRN

## 2021-01-02 MED ORDER — CLONIDINE HCL 0.1 MG PO TABS
0.1000 mg | ORAL_TABLET | Freq: Once | ORAL | Status: AC
  Administered 2021-01-02: 0.1 mg via ORAL
  Filled 2021-01-02: qty 1

## 2021-01-02 MED ORDER — ZOLPIDEM TARTRATE 5 MG PO TABS
5.0000 mg | ORAL_TABLET | Freq: Every evening | ORAL | Status: DC | PRN
  Administered 2021-01-03: 5 mg via ORAL
  Filled 2021-01-02: qty 1

## 2021-01-02 MED ORDER — ISOSORBIDE MONONITRATE ER 30 MG PO TB24
30.0000 mg | ORAL_TABLET | Freq: Every day | ORAL | Status: DC
  Administered 2021-01-03: 30 mg via ORAL
  Filled 2021-01-02 (×2): qty 1

## 2021-01-02 MED ORDER — FOLIC ACID 1 MG PO TABS
1.0000 mg | ORAL_TABLET | Freq: Every day | ORAL | Status: DC
  Administered 2021-01-03: 1 mg via ORAL
  Filled 2021-01-02: qty 1

## 2021-01-02 MED ORDER — CHLORHEXIDINE GLUCONATE CLOTH 2 % EX PADS
6.0000 | MEDICATED_PAD | Freq: Every day | CUTANEOUS | Status: DC
  Filled 2021-01-02: qty 6

## 2021-01-02 MED ORDER — VITAMIN D3 25 MCG PO TABS
1000.0000 [IU] | ORAL_TABLET | Freq: Every evening | ORAL | Status: DC
  Filled 2021-01-02: qty 1

## 2021-01-02 MED ORDER — ALLOPURINOL 100 MG PO TABS: 100.0000 mg | ORAL_TABLET | Freq: Every evening | ORAL | Status: DC

## 2021-01-02 NOTE — ED Notes (Signed)
Called hospitalist, spoke with Marcello Moores

## 2021-01-02 NOTE — Consult Note (Signed)
Reason for Consult: Volume overload in patient with end-stage renal disease Referring Physician: Orma Flaming, MD [facility])  HPI:  60 year old African-American woman with past medical history significant for hypertension, type 2 diabetes mellitus, peripheral vascular disease, chronic obstructive lung disease and end-stage renal disease on hemodialysis.  She has a history of diastolic heart failure and coronary artery disease status post two-vessel CABG 3 months ago when she was admitted to the hospital with NSTEMI.  She presented to [facility] yesterday with acute hypoxic respiratory failure and CT angiogram ruled out PE but showed pulmonary edema.  She typically gets hemodialysis on a Monday/Wednesday/Friday schedule and was supposed to have gotten it on Sunday/Tuesday of this week but missed her treatment on Sunday and went yesterday.  Overnight, she had worsening hypoxia noted on her pulse oximeter with reported shortness of breath without any significant cough or sputum production.  Denies any fever, chills or hemoptysis.  Denies any nausea, vomiting or diarrhea and does not have any sick contacts.  She has intermittently been using her ex-husband's oxygen via nasal cannula.  Dialysis prescription: At [facility], 3-1/2 hours, 2K/2.5 calcium, left upper arm AV fistula, no heparin, uncertain about dry weight  Past Medical History:  Diagnosis Date   Anemia of chronic disease    Anxiety    Arthritis    oa and psoriatic ra   Asthma    Back pain, chronic    lower back   Candida infection finished tx 2 days ago   Chronic insomnia    COPD (chronic obstructive pulmonary disease) (HCC)    Coronary artery calcification seen on CT scan    Diabetes mellitus    type 2   Eczema    ESRD on dialysis [facility])    Essential hypertension    GERD (gastroesophageal reflux disease)    Gout    History of hiatal hernia    Hypoalbuminemia    Hyponatremia    Mild aortic stenosis     Mitral regurgitation    Mitral stenosis    Obesity    PAD (peripheral artery disease) ([place])    a. externial iliac calcification seen on CT 04/2020.   Pneumonia few yrs ago x 2   Sleep apnea    Tobacco abuse    Upper GI bleed 05/2019    Past Surgical History:  Procedure Laterality Date   BACK SURGERY  2016   lower back fusion with cage   BIOPSY  09/23/2017   Procedure: BIOPSY;  Surgeon: Wonda Horner, MD;  Location: [facility];  Service: Endoscopy;;   BIOPSY  05/19/2019   Procedure: BIOPSY;  Surgeon: Otis Brace, MD;  Location: [place];  Service: Gastroenterology;;   CESAREAN SECTION  1990   x 1    COLONOSCOPY WITH PROPOFOL N/A 09/23/2017   Procedure: COLONOSCOPY WITH PROPOFOL Hemostatic clips placed;  Surgeon: Wonda Horner, MD;  Location: [facility];  Service: Endoscopy;  Laterality: N/A;   COLONOSCOPY WITH PROPOFOL N/A 10/02/2020   Procedure: COLONOSCOPY WITH PROPOFOL;  Surgeon: Arta Silence, MD;  Location: [place];  Service: Endoscopy;  Laterality: N/A;   CORONARY ARTERY BYPASS GRAFT N/A 09/13/2020   Procedure: CORONARY ARTERY BYPASS GRAFTING (CABG), ON PUMP, TIMES TWO, USING LEFT INTERNAL MAMMARY ARTERY AND RIGHT ENDOSCOPICALLY HARVESTED GREATER SAPHENOUS VEIN;  Surgeon: Gaye Pollack, MD;  Location: [place];  Service: Open Heart Surgery;  Laterality: N/A;   DILATION AND CURETTAGE OF UTERUS  1988   ESOPHAGOGASTRODUODENOSCOPY N/A 09/22/2020  Procedure: ESOPHAGOGASTRODUODENOSCOPY (EGD);  Surgeon: Arta Silence, MD;  Location: [facility] END[facility]COPY;  Service: Endoscopy;  Laterality: N/A;   ESOPHAGOGASTRODUODENOSCOPY (EGD) WITH PROPOFOL N/A 09/23/2017   Procedure: ESOPHAGOGASTRODUODENOSCOPY (EGD) WITH PROPOFOL;  Surgeon: Wonda Horner, MD;  Location: [facility];  Service: Endoscopy;  Laterality: N/A;   ESOPHAGOGASTRODUODENOSCOPY (EGD) WITH PROPOFOL N/A 05/19/2019   Procedure: ESOPHAGOGASTRODUODENOSCOPY (EGD) WITH PROPOFOL;  Surgeon: Otis Brace, MD;  Location: [place];  Service: Gastroenterology;  Laterality: N/A;   ESOPHAGOGASTRODUODENOSCOPY (EGD) WITH PROPOFOL N/A 10/02/2020   Procedure: ESOPHAGOGASTRODUODENOSCOPY (EGD) WITH PROPOFOL;  Surgeon: Arta Silence, MD;  Location: [place];  Service: Endoscopy;  Laterality: N/A;   GIVENS CAPSULE STUDY N/A 05/19/2019   Procedure: GIVENS CAPSULE STUDY;  Surgeon: Otis Brace, MD;  Location: [place];  Service: Gastroenterology;  Laterality: N/A;   HEMOSTASIS CLIP PLACEMENT  09/22/2020   Procedure: HEMOSTASIS CLIP PLACEMENT;  Surgeon: Arta Silence, MD;  Location: [place];  Service: Endoscopy;;   HEMOSTASIS CONTROL  09/22/2020   Procedure: HEMOSTASIS CONTROL;  Surgeon: Arta Silence, MD;  Location: [place];  Service: Endoscopy;;   HOT HEMOSTASIS N/A 05/19/2019   Procedure: HOT HEMOSTASIS (ARGON PLASMA COAGULATION/BICAP);  Surgeon: Otis Brace, MD;  Location: [facility];  Service: Gastroenterology;  Laterality: N/A;   LEFT HEART CATH AND CORONARY ANGIOGRAPHY N/A 09/10/2020   Procedure: LEFT HEART CATH AND CORONARY ANGIOGRAPHY;  Surgeon: Nelva Bush, MD;  Location: [place] [facility];  Service: Cardiovascular;  Laterality: N/A;   PARTIAL KNEE ARTHROPLASTY Left 11/12/2017   Procedure: left unicompartmental arthroplasty-medial;  Surgeon: Paralee Cancel, MD;  Location: [facility];  Service: Orthopedics;  Laterality: Left;  63min   SUBMUCOSAL INJECTION  09/23/2017   Procedure: SUBMUCOSAL INJECTION;  Surgeon: Wonda Horner, MD;  Location: [facility];  Service: Endoscopy;;  in colon   TEE WITHOUT CARDIOVERSION N/A 09/13/2020   Procedure: TRANSESOPHAGEAL ECHOCARDIOGRAM (TEE);  Surgeon: Gaye Pollack, MD;  Location: [place];  Service: Open Heart Surgery;  Laterality: N/A;   TUBAL LIGATION  1990    Family History  Problem Relation Age of Onset   Lung cancer Mother    Diabetes Sister    Heart disease Sister    Asthma Daughter    Arthritis Daughter    Arthritis Daughter    Thyroid  cancer Other        1/2 sister   Heart disease Other        1/2 sister    Social History:  reports that she quit smoking about 4 months ago. Her smoking use included cigarettes. She has a 20.00 pack-year smoking history. She has never used smokeless tobacco. She reports current alcohol use. She reports that she does not use drugs.  Allergies:  Allergies  Allergen Reactions   3-Methyl-2-Benzothiazolinone Hydrazone Other (See Comments)    Muscle weakness muscle cramping in legs Other reaction(s): Myalgias (Muscle Pain)   Banana Anaphylaxis and Swelling    TONGUE AND MOUTH SWELLING   Black Walnut Flavor Anaphylaxis    Walnuts   Hazelnut ([place]) Allergy Skin Test Anaphylaxis    Hazelnuts   Leflunomide And Related Other (See Comments)    Severe Headache   Lisinopril Swelling    Angioedema  Swelling of the face   No Healthtouch Food Allergies Anaphylaxis    Spicy Mustard 4.7.2021 Pt reports that she eats Regular Yellow Mustard   Other Other (See Comments), Anaphylaxis and Swelling    Cosentyx= angioedema  Other reaction(s): Facial Edema (intolerance) Mouth itches and tongue swelling Hazel nuts  and pecans also Walnuts Walnuts Renal failure Other reaction(s): Other Serotonin Syndrome  Serotonin syndrome  Tremors Other reaction(s): Other Tremors Muscle weakness muscle cramping in legs Other reaction(s): Myalgias (Muscle Pain) Mouth itches and tongue swelling Hazel nuts and pecans also Walnuts    Pecan Extract Allergy Skin Test Anaphylaxis    Pecans    Pecan Nut (Diagnostic) Anaphylaxis    Pecans   Trazodone And Nefazodone Other (See Comments)    Serotonin Syndrome  Tremors   Adalimumab Other (See Comments)    Blisters on abdomen =humira   Escitalopram Oxalate Other (See Comments)    Hand problems - Serotonin Syndrome     Ezetimibe Other (See Comments)    myalgias cramps    Secukinumab Swelling    Cosentyx = angiodema    Statins Other (See Comments)     Leg pains and weakness   Ferrlecit [Na Ferric Gluc Cplx In Sucrose]     Not reaction given from HD   Ibuprofen Other (See Comments)    Renal Problems    Medications: I have reviewed the patient's current medications. Scheduled:  amiodarone  200 mg Oral Daily   [START ON 01/03/2021] Chlorhexidine Gluconate Cloth  6 each Topical Q0600    BMP Latest Ref Rng & Units 01/02/2021 10/03/2020 10/02/2020  Glucose 70 - 99 mg/dL 88 83 88  BUN 6 - 20 mg/dL 17 18 15   Creatinine 0.44 - 1.00 mg/dL 4.86(H) 4.52(H) 3.59(H)  Sodium 135 - 145 mmol/L 138 130(L) 131(L)  Potassium 3.5 - 5.1 mmol/L 4.7 4.0 3.7  Chloride 98 - 111 mmol/L 98 96(L) 96(L)  CO2 22 - 32 mmol/L 31 26 28   Calcium 8.9 - 10.3 mg/dL 10.2 8.3(L) 7.7(L)   CBC Latest Ref Rng & Units 01/02/2021 10/04/2020 10/03/2020  WBC 4.0 - 10.5 K/uL 3.3(L) 4.5 5.7  Hemoglobin 12.0 - 15.0 g/dL 9.9(L) 7.0(L) 7.3(L)  Hematocrit 36.0 - 46.0 % 31.9(L) 21.9(L) 23.4(L)  Platelets 150 - 400 K/uL 124(L) 169 210     CT Angio Chest PE W and/or Wo Contrast  Result Date: 01/02/2021 CLINICAL DATA:  On dialysis. Status post CABG 3 months ago. Shortness of breath. EXAM: CT ANGIOGRAPHY CHEST WITH CONTRAST TECHNIQUE: Multidetector CT imaging of the chest was performed using the standard protocol during bolus administration of intravenous contrast. Multiplanar CT image reconstructions and MIPs were obtained to evaluate the vascular anatomy. CONTRAST:  19mL OMNIPAQUE IOHEXOL 350 MG/ML SOLN COMPARISON:  Plain film 01/02/2021.  CT chest 09/23/2020 FINDINGS: Cardiovascular: The quality of this exam for evaluation of pulmonary embolism is good. The bolus is well timed. No evidence of pulmonary embolism. Aortic atherosclerosis. Moderate cardiomegaly with native coronary artery calcification. Median sternotomy. Pulmonary artery enlargement, outflow tract 3.8 cm Mediastinum/Nodes: Mediastinal edema which is likely related to fluid overload. This limits evaluation for thoracic  adenopathy. Lungs/Pleura: Tiny bilateral pleural effusions. Smooth septal thickening at the lung bases is mild. Left greater than right base dependent subsegmental atelectasis. 4 mm left apical pulmonary nodule on 15/5, not readily apparent on 11/10/2019. Upper Abdomen: Reflux of contrast into the hepatic veins suggests elevated right heart pressures. Too small to characterize right hepatic lobe lesion. Normal imaged portions of the spleen. Underdistended stomach. Musculoskeletal: Anasarca.  Renal osteodystrophy. Review of the MIP images confirms the above findings. IMPRESSION: 1.  No evidence of pulmonary embolism. 2. Congestive heart failure/fluid overload, with small bilateral pleural effusions and septal thickening. Anasarca. 3.  Aortic Atherosclerosis (ICD10-I70.0). 4. Pulmonary artery enlargement suggests pulmonary arterial hypertension. 5.  4 mm left apical pulmonary nodule. No follow-up needed if patient is low-risk. Non-contrast chest CT can be considered in 12 months if patient is high-risk. This recommendation follows the consensus statement: Guidelines for Management of Incidental Pulmonary Nodules Detected on CT Images: From the Fleischner Society 2017; Radiology 2017; 284:228-243. Electronically Signed   By: Abigail Miyamoto M.D.   On: 01/02/2021 12:22   DG Chest Port 1 View  Result Date: 01/02/2021 CLINICAL DATA:  Shortness of breath. EXAM: PORTABLE CHEST 1 VIEW COMPARISON:  November 07, 2020. FINDINGS: Stable cardiomegaly. Sternotomy wires are noted. Mild central pulmonary vascular congestion is noted. Possible minimal bibasilar pulmonary edema is noted. No pneumothorax or pleural effusion is noted. Bony thorax is unremarkable. IMPRESSION: Stable cardiomegaly with mild central pulmonary vascular congestion. Possible minimal bibasilar pulmonary edema may be present. Electronically Signed   By: Marijo Conception M.D.   On: 01/02/2021 10:00    Review of Systems  Constitutional:  Positive for fatigue.  Negative for chills and fever.  HENT:  Negative for nosebleeds, sore throat and trouble swallowing.   Eyes:  Negative for photophobia, redness and visual disturbance.  Respiratory:  Positive for shortness of breath. Negative for cough, chest tightness and wheezing.   Cardiovascular:  Negative for chest pain and leg swelling.  Gastrointestinal:  Negative for abdominal pain, diarrhea, nausea and vomiting.  Endocrine: Negative for polydipsia, polyphagia and polyuria.  Genitourinary:  Negative for dysuria, frequency and urgency.  Musculoskeletal:  Negative for back pain, myalgias and neck pain.  Skin:  Negative for rash and wound.  Neurological:  Negative for tremors, light-headedness and headaches.  Blood pressure (!) 180/79, pulse 68, temperature 98.5 F (36.9 C), temperature source Oral, resp. rate 17, height 5\' 2"  (1.575 m), weight 62 kg, SpO2 99 %. Physical Exam Vitals and nursing note reviewed.  Constitutional:      Appearance: She is well-developed and normal weight. She is ill-appearing.  HENT:     Head: Normocephalic and atraumatic.     Mouth/Throat:     Mouth: Mucous membranes are moist.  Eyes:     Extraocular Movements: Extraocular movements intact.  Neck:     Vascular: JVD present.     Comments: 11 to 12 cm JVP Cardiovascular:     Rate and Rhythm: Normal rate and regular rhythm.     Heart sounds: No murmur heard.   No gallop.  Pulmonary:     Effort: Tachypnea and accessory muscle usage present.     Breath sounds: Examination of the right-lower field reveals rales. Examination of the left-lower field reveals rales. Rales present.  Abdominal:     General: Bowel sounds are normal.     Palpations: Abdomen is soft.  Musculoskeletal:     Right lower leg: No edema.     Left lower leg: No edema.     Comments: Aneurysmal left brachiocephalic fistula  Skin:    General: Skin is warm and dry.     Coloration: Skin is not pale.  Neurological:     Mental Status: She is alert and  oriented to person, place, and time.  Psychiatric:        Mood and Affect: Mood normal.    Assessment/Plan: 1.  Acute hypoxic respiratory failure: Secondary to volume overload/pulmonary edema with acute exacerbation of diastolic heart failure and will have urgent hemodialysis done today for ultrafiltration. 2.  End-stage renal disease: Usually on a Monday/Wednesday/Friday schedule and underwent hemodialysis yesterday electively with additional hemodialysis to be done today  for ultrafiltration and potentially next treatment on Friday. 3.  Hypertension: Exacerbated by volume excess will monitor with ultrafiltration on hemodialysis and resume antihypertensive therapy. 4.  Anemia of chronic kidney disease: Suspect that this is in part dilutional from volume overload and will monitor with hemodialysis/ultrafiltration and continue to follow trend to decide on need to restart ESA. 5.  Secondary hyperparathyroidism: We will trend calcium and phosphorus levels and resume phosphorus binders/VDRA  Eulice Rutledge K. 01/02/2021, 4:12 PM

## 2021-01-02 NOTE — H&P (Signed)
[facility] History and Physical  LORRA FREEMAN YKD:[id number] DOB: 08-28-1960 DOA: 01/02/2021  Referring physician: ED  PCP: [name]., MD   Patient is coming from: Home  Chief Complaint: Shortness of breath  HPI: Jodi Bell is a 60 y.o. female with past medical history of end-stage renal disease on hemodialysis, coronary artery disease with CABG on October 04, 2020, type 2 diabetes mellitus, hypertension presented to hospital with shortness of breath.  Patient stated that she has been feeling short winded since discharge in August.  Patient goes to dialysis Monday, Wednesday and Fridays.  She noted that her pulse ox was low so she went to her primary care provider who referred her to the emergency department.  Of note, patient had full hemodialysis on 01/02/2021.  CTA of the chest did not show any evidence of pulmonary embolism.  ED Course: In the ED, patient was hypoxic on room air.  Chest x-ray did not show any evidence of edema.  EKG showed normal sinus rhythm.  Patient was given amiodarone, nitroglycerin drip, amlodipine, clonidine and hydralazine initially.  WBC was slightly low at 3.3 hemoglobin 9.9 and platelets of 124 on presentation.  She had BNP elevated more than 4500.  Patient received 80 mg of IV Lasix as well.  Patient was then considered for admission to hospital for further evaluation and treatment.  Nephrology was notified from the ED for hemodialysis.  Review of Systems:  All systems were reviewed and were negative unless otherwise mentioned in the HPI  Past Medical History:  Diagnosis Date   Anemia of chronic disease    Anxiety    Arthritis    oa and psoriatic ra   Asthma    Back pain, chronic    lower back   Candida infection finished tx 2 days ago   Chronic insomnia    COPD (chronic obstructive pulmonary disease) (HCC)    Coronary artery calcification seen on CT scan    Diabetes mellitus    type 2   Eczema    ESRD on dialysis [facility])     Essential hypertension    GERD (gastroesophageal reflux disease)    Gout    History of hiatal hernia    Hypoalbuminemia    Hyponatremia    Mild aortic stenosis    Mitral regurgitation    Mitral stenosis    Obesity    PAD (peripheral artery disease) ([place])    a. externial iliac calcification seen on CT 04/2020.   Pneumonia few yrs ago x 2   Sleep apnea    Tobacco abuse    Upper GI bleed 05/2019   Past Surgical History:  Procedure Laterality Date   BACK SURGERY  2016   lower back fusion with cage   BIOPSY  09/23/2017   Procedure: BIOPSY;  Surgeon: Wonda Horner, MD;  Location: [facility];  Service: Endoscopy;;   BIOPSY  05/19/2019   Procedure: BIOPSY;  Surgeon: Otis Brace, MD;  Location: [place];  Service: Gastroenterology;;   CESAREAN SECTION  1990   x 1    COLONOSCOPY WITH PROPOFOL N/A 09/23/2017   Procedure: COLONOSCOPY WITH PROPOFOL Hemostatic clips placed;  Surgeon: Wonda Horner, MD;  [facility]: [facility];  Service: Endoscopy;  Laterality: N/A;   COLONOSCOPY WITH PROPOFOL N/A 10/02/2020   Procedure: COLONOSCOPY WITH PROPOFOL;  Surgeon: Arta Silence, MD;  Location: [place];  Service: Endoscopy;  Laterality: N/A;   CORONARY ARTERY BYPASS GRAFT N/A 09/13/2020   Procedure: CORONARY ARTERY BYPASS  GRAFTING (CABG), ON PUMP, TIMES TWO, USING LEFT INTERNAL MAMMARY ARTERY AND RIGHT ENDOSCOPICALLY HARVESTED GREATER SAPHENOUS VEIN;  Surgeon: Gaye Pollack, MD;  Location: [place];  Service: Open Heart Surgery;  Laterality: N/A;   DILATION AND CURETTAGE OF UTERUS  1988   ESOPHAGOGASTRODUODENOSCOPY N/A 09/22/2020   Procedure: ESOPHAGOGASTRODUODENOSCOPY (EGD);  Surgeon: Arta Silence, MD;  Location: [facility];  Service: Endoscopy;  Laterality: N/A;   ESOPHAGOGASTRODUODENOSCOPY (EGD) WITH PROPOFOL N/A 09/23/2017   Procedure: ESOPHAGOGASTRODUODENOSCOPY (EGD) WITH PROPOFOL;  Surgeon: Wonda Horner, MD;  [facility];  Service: Endoscopy;  Laterality: N/A;    ESOPHAGOGASTRODUODENOSCOPY (EGD) WITH PROPOFOL N/A 05/19/2019   Procedure: ESOPHAGOGASTRODUODENOSCOPY (EGD) WITH PROPOFOL;  Surgeon: Otis Brace, MD;  Location: [place];  Service: Gastroenterology;  Laterality: N/A;   ESOPHAGOGASTRODUODENOSCOPY (EGD) WITH PROPOFOL N/A 10/02/2020   Procedure: ESOPHAGOGASTRODUODENOSCOPY (EGD) WITH PROPOFOL;  Surgeon: Arta Silence, MD;  Location: [place];  Service: Endoscopy;  Laterality: N/A;   GIVENS CAPSULE STUDY N/A 05/19/2019   Procedure: GIVENS CAPSULE STUDY;  Surgeon: Otis Brace, MD;  Location: [place];  Service: Gastroenterology;  Laterality: N/A;   HEMOSTASIS CLIP PLACEMENT  09/22/2020   Procedure: HEMOSTASIS CLIP PLACEMENT;  Surgeon: Arta Silence, MD;  Location: [place];  Service: Endoscopy;;   HEMOSTASIS CONTROL  09/22/2020   Procedure: HEMOSTASIS CONTROL;  Surgeon: Arta Silence, MD;  Location: [place];  Service: Endoscopy;;   HOT HEMOSTASIS N/A 05/19/2019   Procedure: HOT HEMOSTASIS (ARGON PLASMA COAGULATION/BICAP);  Surgeon: Otis Brace, MD;  Location: [facility];  Service: Gastroenterology;  Laterality: N/A;   LEFT HEART CATH AND CORONARY ANGIOGRAPHY N/A 09/10/2020   Procedure: LEFT HEART CATH AND CORONARY ANGIOGRAPHY;  Surgeon: Nelva Bush, MD;  Location: [place] CV LAB;  Service: Cardiovascular;  Laterality: N/A;   PARTIAL KNEE ARTHROPLASTY Left 11/12/2017   Procedure: left unicompartmental arthroplasty-medial;  Surgeon: Paralee Cancel, MD;  [facility];  Service: Orthopedics;  Laterality: Left;  43min   SUBMUCOSAL INJECTION  09/23/2017   Procedure: SUBMUCOSAL INJECTION;  Surgeon: Wonda Horner, MD;  [facility];  Service: Endoscopy;;  in colon   TEE WITHOUT CARDIOVERSION N/A 09/13/2020   Procedure: TRANSESOPHAGEAL ECHOCARDIOGRAM (TEE);  Surgeon: Gaye Pollack, MD;  Location: [place];  Service: Open Heart Surgery;  Laterality: N/A;   TUBAL LIGATION  1990    Social History:  reports  that she quit smoking about 4 months ago. Her smoking use included cigarettes. She has a 20.00 pack-year smoking history. She has never used smokeless tobacco. She reports current alcohol use. She reports that she does not use drugs.  Allergies  Allergen Reactions   3-Methyl-2-Benzothiazolinone Hydrazone Other (See Comments)    Muscle weakness muscle cramping in legs Other reaction(s): Myalgias (Muscle Pain)   Banana Anaphylaxis and Swelling    TONGUE AND MOUTH SWELLING   Black Walnut Flavor Anaphylaxis    Walnuts   Hazelnut ([place]) Allergy Skin Test Anaphylaxis    Hazelnuts   Leflunomide And Related Other (See Comments)    Severe Headache   Lisinopril Swelling    Angioedema  Swelling of the face   No [employer]alt[employer] Food Allergies Anaphylaxis    Spicy Mustard 4.7.2021 Pt reports that she eats Regular Yellow Mustard   Other Other (See Comments), Anaphylaxis and Swelling    Cosentyx= angioedema  Other reaction(s): Facial Edema (intolerance) Mouth itches and tongue swelling Hazel nuts and pecans also Walnuts Walnuts Renal failure Other reaction(s): Other Serotonin Syndrome  Serotonin syndrome  Tremors Other reaction(s): Other Tremors  Muscle weakness muscle cramping in legs Other reaction(s): Myalgias (Muscle Pain) Mouth itches and tongue swelling Hazel nuts and pecans also Walnuts    Pecan Extract Allergy Skin Test Anaphylaxis    Pecans    Pecan Nut (Diagnostic) Anaphylaxis    Pecans   Trazodone And Nefazodone Other (See Comments)    Serotonin Syndrome  Tremors   Adalimumab Other (See Comments)    Blisters on abdomen =humira   Escitalopram Oxalate Other (See Comments)    Hand problems - Serotonin Syndrome     Ezetimibe Other (See Comments)    myalgias cramps    Secukinumab Swelling    Cosentyx = angiodema    Statins Other (See Comments)    Leg pains and weakness   Ferrlecit [Na Ferric Gluc Cplx In Sucrose]     Not reaction given from HD   Ibuprofen  Other (See Comments)    Renal Problems    Family History  Problem Relation Age of Onset   Lung cancer Mother    Diabetes Sister    Heart disease Sister    Asthma Daughter    Arthritis Daughter    Arthritis Daughter    Thyroid cancer Other        1/2 sister   Heart disease Other        1/2 sister     Prior to Admission medications   Medication Sig Start Date End Date Taking? Authorizing Provider  acetaminophen (TYLENOL) 500 MG tablet Take 1,000 mg by mouth every 6 (six) hours as needed for mild pain.    [provider]  allopurinol (ZYLOPRIM) 100 MG tablet Take 100 mg by mouth every evening.    [provider]  amiodarone (PACERONE) 200 MG tablet Take 1 tablet (200 mg total) by mouth daily. 12/11/20   Gaye Pollack, MD  amLODipine (NORVASC) 10 MG tablet Take 10 mg by mouth daily.    [provider]  carboxymethylcellulose 1 % ophthalmic solution Place 1 drop into both eyes 4 (four) times daily.    [provider]  cholecalciferol (VITAMIN D) 25 MCG tablet Take 1 tablet (1,000 Units total) by mouth every evening. 10/04/20   Gold, Patrick Jupiter E, PA-C  cloNIDine (CATAPRES) 0.1 MG tablet Take 1 tablet (0.1 mg total) by mouth 2 (two) times daily. 10/04/20   [name], PA-C  Darbepoetin Alfa (ARANESP) 200 MCG/0.4ML SOSY injection Inject 0.4 mLs (200 mcg total) into the vein every Wednesday with hemodialysis. 10/10/20   Gold, Wilder Glade, PA-C  EPINEPHrine (EPIPEN) 0.3 mg/0.3 mL SOAJ injection Inject 0.3 mLs (0.3 mg total) into the muscle once. Patient taking differently: Inject 0.3 mg into the muscle as needed (for allergic reaction). 09/30/12   Le, Thao P, DO  feeding supplement (ENSURE ENLIVE / ENSURE PLUS) LIQD Take 237 mLs by mouth 3 (three) times daily between meals. 10/04/20 01/02/21  Gold, Patrick Jupiter E, PA-C  fluticasone (FLONASE) 50 MCG/ACT nasal spray Place 1 spray into both nostrils daily.    [provider]  folic acid (FOLVITE) 1 MG tablet Take 1  tablet (1 mg total) by mouth daily. 10/04/20   Jadene Pierini E, PA-C  gabapentin (NEURONTIN) 300 MG capsule Take 1 capsule (300 mg total) by mouth at bedtime as needed (Neuropathic pain). Patient taking differently: Take 300 mg by mouth as needed (pain). 12/14/17 10/20/20  Dana Allan I, MD  heparin 1000 unit/mL SOLN injection 3 mLs (3,000 Units total) by Dialysis route as needed (in dialysis as needed). 10/04/20  Gold, Wayne E, PA-C  heparin 1000 unit/mL SOLN injection 2 mLs (2,000 Units total) by Dialysis route as needed (in dialysis as needed). 10/04/20   Jadene Pierini E, PA-C  heparin 1000 unit/mL SOLN injection 1 mL (1,000 Units total) by Dialysis route as needed (in dialysis). 10/04/20   Jadene Pierini E, PA-C  hydrALAZINE (APRESOLINE) 100 MG tablet Take 1 tablet (100 mg total) by mouth every 8 (eight) hours. 07/06/20   [name], MD  hydrocortisone (ANUSOL-HC) 25 MG suppository Place 1 suppository (25 mg total) rectally 2 (two) times daily. 10/04/20   Jadene Pierini E, PA-C  isosorbide mononitrate (IMDUR) 30 MG 24 hr tablet Take 1 tablet (30 mg total) by mouth daily. 12/11/20   Gaye Pollack, MD  labetalol (NORMODYNE) 200 MG tablet TAKE 1 TABLET BY MOUTH THREE TIMES DAILY 12/10/20   Gold, Patrick Jupiter E, PA-C  lidocaine-prilocaine (EMLA) cream Apply 1 application topically as needed (topical anesthesia for hemodialysis if Gebauers and Lidocaine injection are ineffective.). 10/04/20   Gold, Patrick Jupiter E, PA-C  LORazepam (ATIVAN) 1 MG tablet Take 1 mg by mouth every 8 (eight) hours as needed for anxiety. 06/25/20   [provider]  montelukast (SINGULAIR) 10 MG tablet Take 10 mg by mouth daily with supper.     [provider]  multivitamin (RENA-VIT) TABS tablet Take 1 tablet by mouth daily with supper.    [provider]  olmesartan (BENICAR) 40 MG tablet Take 40 mg by mouth every evening. 05/06/19   [provider]  pantoprazole (PROTONIX) 40 MG tablet Take 40 mg by mouth  daily. 06/09/20   [provider]  pentafluoroprop-tetrafluoroeth Landry Dyke) AERO Apply 1 application topically as needed (topical anesthesia for hemodialysis). 10/04/20   [name], PA-C  predniSONE (DELTASONE) 5 MG tablet Take 1 tablet (5 mg total) by mouth daily with breakfast. 07/06/20   [name], MD  Probiotic Product (PROBIOTIC PO) Take 1 capsule by mouth daily.    [provider]  sorbitol 70 % SOLN Take 30 mLs by mouth daily as needed for moderate constipation. 10/04/20   [name], PA-C  Tiotropium Bromide Monohydrate (SPIRIVA RESPIMAT IN) Inhale 2 puffs into the lungs daily.    [provider]  VENTOLIN HFA 108 (90 Base) MCG/ACT inhaler Inhale 1-2 puffs into the lungs every 6 (six) hours as needed for shortness of breath. wheezing 06/13/15   [provider]    Physical Exam: Vitals:   01/02/21 1111 01/02/21 1316 01/02/21 1612 01/02/21 1615  BP: (!) 189/78 (!) 180/79  (!) 169/76  Pulse: 67 68  67  Resp: (!) 24 17  20   Temp:    98 F (36.7 C)  TempSrc:    Oral  SpO2: 97% 99%  100%  Weight:   62 kg   Height:    5\' 2"  (1.575 m)   Wt Readings from Last 3 Encounters:  01/02/21 62 kg  11/07/20 63 kg  10/04/20 65.2 kg   Body mass index is 24.98 kg/m.  General:  Average built, not in obvious distress HENT: Normocephalic, pupils equally reacting to light and accommodation.  No scleral pallor or icterus noted. Oral mucosa is moist.  Chest:    Diminished breath sounds bilaterally.  Bilateral basal crackles. CVS: S1 &S2 heard. No murmur.  Regular rate and rhythm. Abdomen: Soft, nontender, nondistended.  Bowel sounds are heard.  Liver is not palpable, no abdominal mass palpated Extremities: No cyanosis, clubbing or edema.  Peripheral pulses are  palpable. Psych: Alert, awake and oriented, normal mood CNS:  No cranial nerve deficits.  Power equal in all extremities.   Skin: Warm and dry.  No rashes noted.  Labs on Admission:    CBC: Recent Labs  Lab 01/02/21 1006  WBC 3.3*  NEUTROABS 1.9  HGB 9.9*  HCT 31.9*  MCV 101.9*  PLT 124*    Basic Metabolic Panel: Recent Labs  Lab 01/02/21 1006  NA 138  K 4.7  CL 98  CO2 31  GLUCOSE 88  BUN 17  CREATININE 4.86*  CALCIUM 10.2  MG <0.5*    Liver Function Tests: Recent Labs  Lab 01/02/21 1006  AST 19  ALT 13  ALKPHOS 161*  BILITOT 0.5  PROT 6.3*  ALBUMIN 3.9   No results for input(s): LIPASE, AMYLASE in the last 168 hours. No results for input(s): AMMONIA in the last 168 hours.  Cardiac Enzymes: No results for input(s): CKTOTAL, CKMB, CKMBINDEX, TROPONINI in the last 168 hours.  BNP (last 3 results) Recent Labs    01/02/21 1006  BNP >4,500.0*    ProBNP (last 3 results) No results for input(s): PROBNP in the last 8760 hours.  CBG: No results for input(s): GLUCAP in the last 168 hours.  Lipase     Component Value Date/Time   LIPASE 25 06/28/2020 2119     Urinalysis    Component Value Date/Time   COLORURINE YELLOW 09/12/2020 2333   APPEARANCEUR HAZY (A) 09/12/2020 2333   LABSPEC 1.012 09/12/2020 2333   PHURINE 8.0 09/12/2020 2333   GLUCOSEU NEGATIVE 09/12/2020 2333   HGBUR SMALL (A) 09/12/2020 2333   BILIRUBINUR NEGATIVE 09/12/2020 2333   KETONESUR NEGATIVE 09/12/2020 2333   PROTEINUR 100 (A) 09/12/2020 2333   UROBILINOGEN 0.2 09/05/2014 0013   NITRITE NEGATIVE 09/12/2020 2333   LEUKOCYTESUR NEGATIVE 09/12/2020 2333     Drugs of Abuse     Component Value Date/Time   LABOPIA NONE DETECTED 05/16/2018 1158   COCAINSCRNUR NONE DETECTED 05/16/2018 1158   LABBENZ NONE DETECTED 05/16/2018 1158   AMPHETMU NONE DETECTED 05/16/2018 1158   THCU NONE DETECTED 05/16/2018 1158   LABBARB NONE DETECTED 05/16/2018 1158      Radiological Exams on Admission: CT Angio Chest PE W and/or Wo Contrast  Result Date: 01/02/2021 CLINICAL DATA:  On dialysis. Status post CABG 3 months ago. Shortness of breath. EXAM: CT ANGIOGRAPHY  CHEST WITH CONTRAST TECHNIQUE: Multidetector CT imaging of the chest was performed using the standard protocol during bolus administration of intravenous contrast. Multiplanar CT image reconstructions and MIPs were obtained to evaluate the vascular anatomy. CONTRAST:  3mL OMNIPAQUE IOHEXOL 350 MG/ML SOLN COMPARISON:  Plain film 01/02/2021.  CT chest 09/23/2020 FINDINGS: Cardiovascular: The quality of this exam for evaluation of pulmonary embolism is good. The bolus is well timed. No evidence of pulmonary embolism. Aortic atherosclerosis. Moderate cardiomegaly with native coronary artery calcification. Median sternotomy. Pulmonary artery enlargement, outflow tract 3.8 cm Mediastinum/Nodes: Mediastinal edema which is likely related to fluid overload. This limits evaluation for thoracic adenopathy. Lungs/Pleura: Tiny bilateral pleural effusions. Smooth septal thickening at the lung bases is mild. Left greater than right base dependent subsegmental atelectasis. 4 mm left apical pulmonary nodule on 15/5, not readily apparent on 11/10/2019. Upper Abdomen: Reflux of contrast into the hepatic veins suggests elevated right heart pressures. Too small to characterize right hepatic lobe lesion. Normal imaged portions of the spleen. Underdistended stomach. Musculoskeletal: Anasarca.  Renal osteodystrophy. Review of the MIP images confirms the above findings. IMPRESSION:  1.  No evidence of pulmonary embolism. 2. Congestive heart failure/fluid overload, with small bilateral pleural effusions and septal thickening. Anasarca. 3.  Aortic Atherosclerosis (ICD10-I70.0). 4. Pulmonary artery enlargement suggests pulmonary arterial hypertension. 5. 4 mm left apical pulmonary nodule. No follow-up needed if patient is low-risk. Non-contrast chest CT can be considered in 12 months if patient is high-risk. This recommendation follows the consensus statement: Guidelines for Management of Incidental Pulmonary Nodules Detected on CT Images:  From the Fleischner Society 2017; Radiology 2017; 284:228-243. Electronically Signed   By: Abigail Miyamoto M.D.   On: 01/02/2021 12:22   DG Chest Port 1 View  Result Date: 01/02/2021 CLINICAL DATA:  Shortness of breath. EXAM: PORTABLE CHEST 1 VIEW COMPARISON:  November 07, 2020. FINDINGS: Stable cardiomegaly. Sternotomy wires are noted. Mild central pulmonary vascular congestion is noted. Possible minimal bibasilar pulmonary edema is noted. No pneumothorax or pleural effusion is noted. Bony thorax is unremarkable. IMPRESSION: Stable cardiomegaly with mild central pulmonary vascular congestion. Possible minimal bibasilar pulmonary edema may be present. Electronically Signed   By: Marijo Conception M.D.   On: 01/02/2021 10:00    EKG: Personally reviewed by me which shows EKG with normal sinus rhythm  Assessment/Plan Principal Problem:   Acute exacerbation of CHF (congestive heart failure) (HCC) Active Problems:   Type 2 diabetes mellitus (HCC)   Essential hypertension   COPD (chronic obstructive pulmonary disease) (HCC)   ESRD (end stage renal disease) (HCC)   S/P CABG x 2   Acute exacerbation of congestive heart failure.  Patient presented with hypoxia.  Chest x-ray negative for acute pulmonary edema but with basal crackles..  Patient is supposed to go for hemodialysis today.  Received 1 dose of 80 mg IV Lasix.  Status post CABG x2.  Crackles on auscultation.  Makes minimal urine.  End stage renal disease on hemodialysis.  Patient is supposed to go for hemodialysis today.  Type 2 diabetes mellitus.  We will put the patient on sliding scale insulin, Accu-Cheks, diabetic diet.  Essential hypertension.  Will resume home medication when medication reconciliation is done.  Severe hypomagnesemia.  Will replace with magnesium 2 g IV stat.  Communicated with nursing staff about it.  COPD..  We will continue the patient on nebulizer.  No active wheezing.  Patient does have crackles.  DVT  Prophylaxis: Heparin subcu  Consultant: Nephrology  Code Status: Full code  Microbiology none  Antibiotics: None  Family Communication:  Patients' condition and plan of care including tests being ordered have been discussed with the patient who indicate understanding and agree with the plan.   Status is: Observation  The patient remains OBS appropriate and will d/c before 2 midnights.   Severity of Illness: The appropriate patient status for this patient is OBSERVATION. Observation status is judged to be reasonable and necessary in order to provide the required intensity of service to ensure the patient's safety. The patient's presenting symptoms, physical exam findings, and initial radiographic and laboratory data in the context of their medical condition is felt to place them at decreased risk for further clinical deterioration. Furthermore, it is anticipated that the patient will be medically stable for discharge from the hospital within 2 midnights of admission.   Signed, Flora Lipps, MD [facility] 01/02/2021

## 2021-01-02 NOTE — Progress Notes (Signed)
Received a phone call from Facility: [facility]  Requesting MD: Dr. Rogene Houston  Patient with h/o ESRD on dialysis, CAD with CABG in 7/22, T2DM, HTN,  presenting with shortness of breath and acute respiratory failure with hypoxia satting 86% on room air. CTA ruled out PE, but showed pulmonary edema and BNP >4500. She was last dialyzed yesterday, but normally MWF (holiday schedule). She noticed last night that her pulse ox was 86% on room air and had the shortness of breath. She normally doesn't use oxygen, but was using a family members. Her initial troponin was normal at 17, delta pending. HTN on arrival and home meds given except for labetalol. 80mg  of lasix given. Already talked to nephrology and hopefully plans for dialysis today.  Plan of care: given 80mg  of lasix, nephrology consulted and plan for dialysis tonight.  The patient will be accepted for admission to telemetry at [facility] when bed is available. nephrology will be consulted by requesting physician prior to transfer.  -asked for magnesium to be added.    Nursing staff, Please call the [place] number at the top of Amion at the time of the patient's arrival so that the patient can be paged to the admitting physician.   Casimer Bilis, M.D. [facility]

## 2021-01-02 NOTE — ED Notes (Signed)
EMT walked patient back on pulse ox. SAT 86% on RA. Placed on [facility]. SAT now 95%

## 2021-01-02 NOTE — ED Provider Notes (Addendum)
[facility] EMERGENCY DEPARTMENT Provider Note   CSN: [id number] Arrival date & time: 01/02/21  0909     History Chief Complaint  Patient presents with   Shortness of Breath    Jodi Bell is a 60 y.o. female.  Patient is a dialysis patient.  Normally dialyzed Monday Wednesdays Fridays.  But due to the holiday schedule was dialyzed yesterday.  And that was completed.  Patient was admitted end of July through August 25 had a significant MI with a CABG done.  Patient also known to have diastolic heart failure as well.  The CABG was August 4.  Patient's been feeling short of breath since her discharge on August 25.  Patient has a pulse ox at home.  Starting yesterday she was getting sats below 90% she called her primary care doctor and they told her appropriately to come in.  Upon arrival here oxygen saturation was 86% on room air.  Patient does not have oxygen at home.  She has been using a family members on and off.      Past Medical History:  Diagnosis Date   Anemia of chronic disease    Anxiety    Arthritis    oa and psoriatic ra   Asthma    Back pain, chronic    lower back   Candida infection finished tx 2 days ago   Chronic insomnia    COPD (chronic obstructive pulmonary disease) (HCC)    Coronary artery calcification seen on CT scan    Diabetes mellitus    type 2   Eczema    ESRD on dialysis [facility])    Essential hypertension    GERD (gastroesophageal reflux disease)    Gout    History of hiatal hernia    Hypoalbuminemia    Hyponatremia    Mild aortic stenosis    Mitral regurgitation    Mitral stenosis    Obesity    PAD (peripheral artery disease) ([place])    a. externial iliac calcification seen on CT 04/2020.   Pneumonia few yrs ago x 2   Sleep apnea    Tobacco abuse    Upper GI bleed 05/2019    Patient Active Problem List   Diagnosis Date Noted   Pressure injury of skin 09/21/2020   Malnutrition of moderate degree 09/20/2020   S/P CABG x 2  09/13/2020   Non-ST elevation (NSTEMI) myocardial infarction [facility])    Pulmonary edema 09/08/2020   CHF (congestive heart failure) ([place]) 09/08/2020   Hypertensive urgency    Dyspnea 07/03/2020   GI bleeding 05/17/2019   Volume overload state of heart 09/14/2018   Intervertebral cervical disc disorder with myelopathy, cervical region 09/14/2018   OSA (obstructive sleep apnea) 09/14/2018   Asthma 05/16/2018   Suspected COVID-19 virus infection 05/16/2018   ESRD (end stage renal disease) ([place]) 05/16/2018   Fluid overload 02/27/2018   Chest pain, rule out acute myocardial infarction 12/12/2017   Status post left partial knee replacement 11/12/2017   Hematochezia 09/20/2017   GERD (gastroesophageal reflux disease) 09/20/2017   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 2 diabetes mellitus ([place]) 09/20/2017   COPD (chronic obstructive pulmonary disease) ([place]) 09/20/2017   Tobacco use 09/20/2017   Symptomatic anemia 09/20/2017   Headache 09/20/2017   Serotonin syndrome 09/28/2013   Acute respiratory failure with hypoxia ([place]) 09/28/2013   Encephalopathy acute 09/28/2013   Hypotension, unspecified 09/28/2013   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   Type 2 diabetes mellitus ([place]) 03/06/2008  INSOMNIA, CHRONIC 03/06/2008   Essential hypertension 03/06/2008   ASTHMA 03/06/2008   ECZEMA 03/06/2008   BACK PAIN, LUMBAR, CHRONIC 03/06/2008    Past Surgical History:  Procedure Laterality Date   BACK SURGERY  2016   lower back fusion with cage   BIOPSY  09/23/2017   Procedure: BIOPSY;  Surgeon: Wonda Horner, MD;  [facility]: [facility];  Service: Endoscopy;;   BIOPSY  05/19/2019   Procedure: BIOPSY;  Surgeon: Otis Brace, MD;  Location: [facility];  Service: Gastroenterology;;   CESAREAN SECTION  1990   x 1    COLONOSCOPY WITH PROPOFOL N/A 09/23/2017   Procedure: COLONOSCOPY WITH PROPOFOL Hemostatic clips placed;  Surgeon: Wonda Horner, MD;  [facility];  Service: Endoscopy;   Laterality: N/A;   COLONOSCOPY WITH PROPOFOL N/A 10/02/2020   Procedure: COLONOSCOPY WITH PROPOFOL;  Surgeon: Arta Silence, MD;  Location: [facility];  Service: Endoscopy;  Laterality: N/A;   CORONARY ARTERY BYPASS GRAFT N/A 09/13/2020   Procedure: CORONARY ARTERY BYPASS GRAFTING (CABG), ON PUMP, TIMES TWO, USING LEFT INTERNAL MAMMARY ARTERY AND RIGHT ENDOSCOPICALLY HARVESTED GREATER SAPHENOUS VEIN;  Surgeon: Gaye Pollack, MD;  Location: [facility];  Service: Open Heart Surgery;  Laterality: N/A;   DILATION AND CURETTAGE OF UTERUS  1988   ESOPHAGOGASTRODUODENOSCOPY N/A 09/22/2020   Procedure: ESOPHAGOGASTRODUODENOSCOPY (EGD);  Surgeon: Arta Silence, MD;  Location: [facility] ENDOSCOPY;  Service: Endoscopy;  Laterality: N/A;   ESOPHAGOGASTRODUODENOSCOPY (EGD) WITH PROPOFOL N/A 09/23/2017   Procedure: ESOPHAGOGASTRODUODENOSCOPY (EGD) WITH PROPOFOL;  Surgeon: Wonda Horner, MD;  [facility];  Service: Endoscopy;  Laterality: N/A;   ESOPHAGOGASTRODUODENOSCOPY (EGD) WITH PROPOFOL N/A 05/19/2019   Procedure: ESOPHAGOGASTRODUODENOSCOPY (EGD) WITH PROPOFOL;  Surgeon: Otis Brace, MD;  Location: [facility];  Service: Gastroenterology;  Laterality: N/A;   ESOPHAGOGASTRODUODENOSCOPY (EGD) WITH PROPOFOL N/A 10/02/2020   Procedure: ESOPHAGOGASTRODUODENOSCOPY (EGD) WITH PROPOFOL;  Surgeon: Arta Silence, MD;  Location: [place];  Service: Endoscopy;  Laterality: N/A;   GIVENS CAPSULE STUDY N/A 05/19/2019   Procedure: GIVENS CAPSULE STUDY;  Surgeon: Otis Brace, MD;  Location: [facility];  Service: Gastroenterology;  Laterality: N/A;   HEMOSTASIS CLIP PLACEMENT  09/22/2020   Procedure: HEMOSTASIS CLIP PLACEMENT;  Surgeon: Arta Silence, MD;  Location: [facility];  Service: Endoscopy;;   HEMOSTASIS CONTROL  09/22/2020   Procedure: HEMOSTASIS CONTROL;  Surgeon: Arta Silence, MD;  Location: [facility];  Service: Endoscopy;;   HOT HEMOSTASIS N/A 05/19/2019   Procedure: HOT HEMOSTASIS  (ARGON PLASMA COAGULATION/BICAP);  Surgeon: Otis Brace, MD;  Location[facility] ENDOSCOPY;  Service: Gastroenterology;  Laterality: N/A;   LEFT HEART CATH AND CORONARY ANGIOGRAPHY N/A 09/10/2020   Procedure: LEFT HEART CATH AND CORONARY ANGIOGRAPHY;  Surgeon: Nelva Bush, MD;  Location: [facility] CV LAB;  Service: Cardiovascular;  Laterality: N/A;   PARTIAL KNEE ARTHROPLASTY Left 11/12/2017   Procedure: left unicompartmental arthroplasty-medial;  Surgeon: Paralee Cancel, MD;  [facility];  Service: Orthopedics;  Laterality: Left;  69min   SUBMUCOSAL INJECTION  09/23/2017   Procedure: SUBMUCOSAL INJECTION;  Surgeon: Wonda Horner, MD;  [facility];  Service: Endoscopy;;  in colon   TEE WITHOUT CARDIOVERSION N/A 09/13/2020   Procedure: TRANSESOPHAGEAL ECHOCARDIOGRAM (TEE);  Surgeon: Gaye Pollack, MD;  Location: [facility];  Service: Open Heart Surgery;  Laterality: N/A;   TUBAL LIGATION  1990     OB History   No obstetric history on file.     Family History  Problem Relation Age of Onset   Lung  cancer Mother    Diabetes Sister    Heart disease Sister    Asthma Daughter    Arthritis Daughter    Arthritis Daughter    Thyroid cancer Other        1/2 sister   Heart disease Other        1/2 sister    Social History   Tobacco Use   Smoking status: Former    Packs/day: 0.50    Years: 40.00    Pack years: 20.00    Types: Cigarettes    Quit date: 08/2020    Years since quitting: 0.3   Smokeless tobacco: Never  Vaping Use   Vaping Use: Never used  Substance Use Topics   Alcohol use: Yes    Comment: occ   Drug use: No    Home Medications Prior to Admission medications   Medication Sig Start Date End Date Taking? Authorizing Provider  acetaminophen (TYLENOL) 500 MG tablet Take 1,000 mg by mouth every 6 (six) hours as needed for mild pain.    [provider]  allopurinol (ZYLOPRIM) 100 MG tablet Take 100 mg by mouth every evening.    [provider]  amiodarone (PACERONE) 200 MG tablet Take 1 tablet (200 mg total) by mouth daily. 12/11/20   Gaye Pollack, MD  amLODipine (NORVASC) 10 MG tablet Take 10 mg by mouth daily.    [provider]  carboxymethylcellulose 1 % ophthalmic solution Place 1 drop into both eyes 4 (four) times daily.    [provider]  cholecalciferol (VITAMIN D) 25 MCG tablet Take 1 tablet (1,000 Units total) by mouth every evening. 10/04/20   Gold, Patrick Jupiter E, PA-C  cloNIDine (CATAPRES) 0.1 MG tablet Take 1 tablet (0.1 mg total) by mouth 2 (two) times daily. 10/04/20   [name], PA-C  Darbepoetin Alfa (ARANESP) 200 MCG/0.4ML SOSY injection Inject 0.4 mLs (200 mcg total) into the vein every Wednesday with hemodialysis. 10/10/20   Gold, Wilder Glade, PA-C  EPINEPHrine (EPIPEN) 0.3 mg/0.3 mL SOAJ injection Inject 0.3 mLs (0.3 mg total) into the muscle once. Patient taking differently: Inject 0.3 mg into the muscle as needed (for allergic reaction). 09/30/12   Le, Thao P, DO  feeding supplement (ENSURE ENLIVE / ENSURE PLUS) LIQD Take 237 mLs by mouth 3 (three) times daily between meals. 10/04/20 01/02/21  Gold, Patrick Jupiter E, PA-C  fluticasone (FLONASE) 50 MCG/ACT nasal spray Place 1 spray into both nostrils daily.    [provider]  folic acid (FOLVITE) 1 MG tablet Take 1 tablet (1 mg total) by mouth daily. 10/04/20   Jadene Pierini E, PA-C  gabapentin (NEURONTIN) 300 MG capsule Take 1 capsule (300 mg total) by mouth at bedtime as needed (Neuropathic pain). Patient taking differently: Take 300 mg by mouth as needed (pain). 12/14/17 10/20/20  Dana Allan I, MD  heparin 1000 unit/mL SOLN injection 3 mLs (3,000 Units total) by Dialysis route as needed (in dialysis as needed). 10/04/20   Jadene Pierini E, PA-C  heparin 1000 unit/mL SOLN injection 2 mLs (2,000 Units total) by Dialysis route as needed (in dialysis as needed). 10/04/20   Jadene Pierini E, PA-C  heparin 1000 unit/mL SOLN injection 1 mL (1,000 Units  total) by Dialysis route as needed (in dialysis). 10/04/20   Jadene Pierini E, PA-C  hydrALAZINE (APRESOLINE) 100 MG tablet Take 1 tablet (100 mg total) by mouth every 8 (eight) hours. 07/06/20   [name], MD  hydrocortisone (ANUSOL-HC) 25 MG suppository  Place 1 suppository (25 mg total) rectally 2 (two) times daily. 10/04/20   Jadene Pierini E, PA-C  isosorbide mononitrate (IMDUR) 30 MG 24 hr tablet Take 1 tablet (30 mg total) by mouth daily. 12/11/20   Gaye Pollack, MD  labetalol (NORMODYNE) 200 MG tablet TAKE 1 TABLET BY MOUTH THREE TIMES DAILY 12/10/20   Gold, Patrick Jupiter E, PA-C  lidocaine-prilocaine (EMLA) cream Apply 1 application topically as needed (topical anesthesia for hemodialysis if Gebauers and Lidocaine injection are ineffective.). 10/04/20   Gold, Patrick Jupiter E, PA-C  LORazepam (ATIVAN) 1 MG tablet Take 1 mg by mouth every 8 (eight) hours as needed for anxiety. 06/25/20   [provider]  montelukast (SINGULAIR) 10 MG tablet Take 10 mg by mouth daily with supper.     [provider]  multivitamin (RENA-VIT) TABS tablet Take 1 tablet by mouth daily with supper.    [provider]  olmesartan (BENICAR) 40 MG tablet Take 40 mg by mouth every evening. 05/06/19   [provider]  pantoprazole (PROTONIX) 40 MG tablet Take 40 mg by mouth daily. 06/09/20   [provider]  pentafluoroprop-tetrafluoroeth Landry Dyke) AERO Apply 1 application topically as needed (topical anesthesia for hemodialysis). 10/04/20   [name], PA-C  predniSONE (DELTASONE) 5 MG tablet Take 1 tablet (5 mg total) by mouth daily with breakfast. 07/06/20   [name], MD  Probiotic Product (PROBIOTIC PO) Take 1 capsule by mouth daily.    [provider]  sorbitol 70 % SOLN Take 30 mLs by mouth daily as needed for moderate constipation. 10/04/20   [name], PA-C  Tiotropium Bromide Monohydrate (SPIRIVA RESPIMAT IN) Inhale 2 puffs into the lungs daily.    [provider]  VENTOLIN HFA 108 (90 Base) MCG/ACT inhaler Inhale 1-2 puffs into the lungs every 6 (six) hours as needed for shortness of breath. wheezing 06/13/15   [provider]    Allergies    3-methyl-2-benzothiazolinone hydrazone, Banana, Black walnut flavor, Hazelnut (filbert) allergy skin test, Leflunomide and related, Lisinopril, No healthtouch food allergies, Other, Pecan extract allergy skin test, Pecan nut (diagnostic), Trazodone and nefazodone, Adalimumab, Escitalopram oxalate, Ezetimibe, Secukinumab, Statins, Ferrlecit [na ferric gluc cplx in sucrose], and Ibuprofen  Review of Systems   Review of Systems  Constitutional:  Negative for chills and fever.  HENT:  Negative for ear pain and sore throat.   Eyes:  Negative for pain and visual disturbance.  Respiratory:  Positive for shortness of breath. Negative for cough.   Cardiovascular:  Negative for chest pain and palpitations.  Gastrointestinal:  Negative for abdominal pain and vomiting.  Genitourinary:  Negative for dysuria and hematuria.  Musculoskeletal:  Negative for arthralgias and back pain.  Skin:  Negative for color change and rash.  Neurological:  Negative for seizures and syncope.  All other systems reviewed and are negative.  Physical Exam Updated Vital Signs BP (!) 189/78   Pulse 67   Temp 98.5 F (36.9 C) (Oral)   Resp (!) 24   Ht 1.575 m (5\' 2" )   Wt 62 kg   SpO2 97%   BMI 25.00 kg/m   Physical Exam Vitals and nursing note reviewed.  Constitutional:      General: She is not in acute distress.    Appearance: Normal appearance. She is well-developed.  HENT:     Head: Normocephalic and atraumatic.  Eyes:     Extraocular Movements: Extraocular movements intact.     Conjunctiva/sclera: Conjunctivae normal.  Pupils: Pupils are equal, round, and reactive to light.  Cardiovascular:     Rate and Rhythm: Normal rate and regular rhythm.     Heart sounds: No murmur heard. Pulmonary:      Effort: Pulmonary effort is normal. No respiratory distress.     Breath sounds: Rales present. No wheezing or rhonchi.  Abdominal:     Palpations: Abdomen is soft.     Tenderness: There is no abdominal tenderness.  Musculoskeletal:        General: No swelling. Normal range of motion.     Cervical back: Normal range of motion and neck supple.     Right lower leg: No edema.     Left lower leg: No edema.     Comments: Left upper extremity with AV fistula good thrill.  Skin:    General: Skin is warm and dry.     Capillary Refill: Capillary refill takes less than 2 seconds.  Neurological:     General: No focal deficit present.     Mental Status: She is alert and oriented to person, place, and time.     Cranial Nerves: No cranial nerve deficit.     Sensory: No sensory deficit.     Motor: No weakness.  Psychiatric:        Mood and Affect: Mood normal.    ED Results / Procedures / Treatments   Labs (all labs ordered are listed, but only abnormal results are displayed) Labs Reviewed  BRAIN NATRIURETIC PEPTIDE - Abnormal; Notable for the following components:      Result Value   B Natriuretic Peptide >4,500.0 (*)    All other components within normal limits  COMPREHENSIVE METABOLIC PANEL - Abnormal; Notable for the following components:   Creatinine, Ser 4.86 (*)    Total Protein 6.3 (*)    Alkaline Phosphatase 161 (*)    GFR, Estimated 10 (*)    All other components within normal limits  CBC WITH DIFFERENTIAL/PLATELET - Abnormal; Notable for the following components:   WBC 3.3 (*)    RBC 3.13 (*)    Hemoglobin 9.9 (*)    HCT 31.9 (*)    MCV 101.9 (*)    RDW 17.0 (*)    Platelets 124 (*)    All other components within normal limits  RESP PANEL BY RT-PCR (FLU A&B, COVID) ARPGX2  TROPONIN I (HIGH SENSITIVITY)  TROPONIN I (HIGH SENSITIVITY)    EKG EKG Interpretation  Date/Time:  Wednesday January 02 2021 09:23:35 EST Ventricular Rate:  72 PR Interval:  178 QRS  Duration: 98 QT Interval:  422 QTC Calculation: 462 R Axis:   -12 Text Interpretation: Sinus rhythm Confirmed by Fredia Sorrow [phone]) on 01/02/2021 9:55:53 AM  Radiology CT Angio Chest PE W and/or Wo Contrast  Result Date: 01/02/2021 CLINICAL DATA:  On dialysis. Status post CABG 3 months ago. Shortness of breath. EXAM: CT ANGIOGRAPHY CHEST WITH CONTRAST TECHNIQUE: Multidetector CT imaging of the chest was performed using the standard protocol during bolus administration of intravenous contrast. Multiplanar CT image reconstructions and MIPs were obtained to evaluate the vascular anatomy. CONTRAST:  96mL OMNIPAQUE IOHEXOL 350 MG/ML SOLN COMPARISON:  Plain film 01/02/2021.  CT chest 09/23/2020 FINDINGS: Cardiovascular: The quality of this exam for evaluation of pulmonary embolism is good. The bolus is well timed. No evidence of pulmonary embolism. Aortic atherosclerosis. Moderate cardiomegaly with native coronary artery calcification. Median sternotomy. Pulmonary artery enlargement, outflow tract 3.8 cm Mediastinum/Nodes: Mediastinal edema which is likely related to fluid  overload. This limits evaluation for thoracic adenopathy. Lungs/Pleura: Tiny bilateral pleural effusions. Smooth septal thickening at the lung bases is mild. Left greater than right base dependent subsegmental atelectasis. 4 mm left apical pulmonary nodule on 15/5, not readily apparent on 11/10/2019. Upper Abdomen: Reflux of contrast into the hepatic veins suggests elevated right heart pressures. Too small to characterize right hepatic lobe lesion. Normal imaged portions of the spleen. Underdistended stomach. Musculoskeletal: Anasarca.  Renal osteodystrophy. Review of the MIP images confirms the above findings. IMPRESSION: 1.  No evidence of pulmonary embolism. 2. Congestive heart failure/fluid overload, with small bilateral pleural effusions and septal thickening. Anasarca. 3.  Aortic Atherosclerosis (ICD10-I70.0). 4. Pulmonary artery  enlargement suggests pulmonary arterial hypertension. 5. 4 mm left apical pulmonary nodule. No follow-up needed if patient is low-risk. Non-contrast chest CT can be considered in 12 months if patient is high-risk. This recommendation follows the consensus statement: Guidelines for Management of Incidental Pulmonary Nodules Detected on CT Images: From the Fleischner Society 2017; Radiology 2017; 284:228-243. Electronically Signed   By: Abigail Miyamoto M.D.   On: 01/02/2021 12:22   DG Chest Port 1 View  Result Date: 01/02/2021 CLINICAL DATA:  Shortness of breath. EXAM: PORTABLE CHEST 1 VIEW COMPARISON:  November 07, 2020. FINDINGS: Stable cardiomegaly. Sternotomy wires are noted. Mild central pulmonary vascular congestion is noted. Possible minimal bibasilar pulmonary edema is noted. No pneumothorax or pleural effusion is noted. Bony thorax is unremarkable. IMPRESSION: Stable cardiomegaly with mild central pulmonary vascular congestion. Possible minimal bibasilar pulmonary edema may be present. Electronically Signed   By: Marijo Conception M.D.   On: 01/02/2021 10:00    Procedures Procedures   Medications Ordered in ED Medications  amiodarone (PACERONE) tablet 200 mg (200 mg Oral Given 01/02/21 1023)  furosemide (LASIX) injection 80 mg (has no administration in time range)  nitroGLYCERIN 50 mg in dextrose 5 % 250 mL (0.2 mg/mL) infusion (has no administration in time range)  amLODipine (NORVASC) tablet 10 mg (10 mg Oral Given 01/02/21 1023)  cloNIDine (CATAPRES) tablet 0.1 mg (0.1 mg Oral Given 01/02/21 1023)  hydrALAZINE (APRESOLINE) tablet 100 mg (100 mg Oral Given 01/02/21 1023)  iohexol (OMNIPAQUE) 350 MG/ML injection 100 mL (75 mLs Intravenous Contrast Given 01/02/21 1156)    ED Course  I have reviewed the triage vital signs and the nursing notes.  Pertinent labs & imaging results that were available during my care of the patient were reviewed by me and considered in my medical decision making  (see chart for details).    MDM Rules/Calculators/A&P                          CRITICAL CARE Performed by: Fredia Sorrow Total critical care time: 45 minutes Critical care time was exclusive of separately billable procedures and treating other patients. Critical care was necessary to treat or prevent imminent or life-threatening deterioration. Critical care was time spent personally by me on the following activities: development of treatment plan with patient and/or surrogate as well as nursing, discussions with consultants, evaluation of patient's response to treatment, examination of patient, obtaining history from patient or surrogate, ordering and performing treatments and interventions, ordering and review of laboratory studies, ordering and review of radiographic studies, pulse oximetry and re-evaluation of patient's condition.    Patient dialysis patient had full dialysis just yesterday.  Patient also with known history of diastolic heart failure.  Chest x-ray without frank pulmonary edema.  Patient's blood pressure also  elevated here.  We will prescribe her normal blood pressure medicines as well as her amiodarone but will hold on the labetalol for now.  Will check troponins and BNP.  Patient may require CT angio chest to further evaluate.  States patient's been on dialysis now for 2-1/2 years.  CBC significant for a little bit of leukopenia with a white count of 3.3.  Hemoglobin 9.9 is actually pretty good for the patient.  Platelets are in the 124,000.  Initial troponin 17 reassuring.  COVID panel negative and influenza panel negative.  Proceed with CT angio for further evaluation.  Patient also was on Lasix in the past.  We will ask her whether she makes any urine.  BNP markedly elevated.  Greater than 4500.  This probably explains patient's symptoms.  Also CT angio chest negative for pulmonary embolus but consistent with congestive heart failure.  Patient's blood pressures are  still somewhat elevated despite being given her blood pressure medicine still 183/73.  We will start nitroglycerin drip.  We will try 80 mg of Lasix to see if she is able to diurese but patient states she does not make much urine.  COVID and flu testing is negative.  Patient was dialyzed yesterday.  Patient's had admissions to [facility] in the past as stated was admitted there from the end of July to August 25.   Patient will require admission.  We will discussed with nephrology and then the hospitalist.  Discussed with on-call nephrology, Dr. Posey Pronto listed, he states that they will probably plan to dialyze her later today.  I will hold on starting the nitroglycerin drip.  We will try the Lasix 80 mg first.  Awaiting call from hospitalist for admission to [facility].  Final Clinical Impression(s) / ED Diagnoses Final diagnoses:  Acute on chronic diastolic congestive heart failure ([place])  ESRD needing dialysis [facility])    Rx / DC Orders ED Discharge Orders     None        Fredia Sorrow, MD 01/02/21 1248    Fredia Sorrow, MD 01/02/21 1311

## 2021-01-02 NOTE — ED Triage Notes (Signed)
Has been wearing oxygen at HS at home that was borrowed from a family member. Today she noticed worsening dyspnea when lying down and bending over.  She checked her pulse ox at home and it was reading in the upper 70s to mid 80s on room air.  That is why she decided to come in today.  Dyspnea with activity Dialysis pt. With fistula in left arm. Dialyzed yesterday.

## 2021-01-02 NOTE — Progress Notes (Signed)
  Echocardiogram 2D Echocardiogram has been performed.  Jodi Bell 01/02/2021, 6:03 PM

## 2021-01-03 DIAGNOSIS — I10 Essential (primary) hypertension: Secondary | ICD-10-CM | POA: Diagnosis not present

## 2021-01-03 DIAGNOSIS — N186 End stage renal disease: Secondary | ICD-10-CM | POA: Diagnosis not present

## 2021-01-03 DIAGNOSIS — J42 Unspecified chronic bronchitis: Secondary | ICD-10-CM | POA: Diagnosis not present

## 2021-01-03 DIAGNOSIS — I5033 Acute on chronic diastolic (congestive) heart failure: Secondary | ICD-10-CM | POA: Diagnosis not present

## 2021-01-03 LAB — BASIC METABOLIC PANEL
Anion gap: 7 (ref 5–15)
BUN: <5 mg/dL — ABNORMAL LOW (ref 6–20)
CO2: 31 mmol/L (ref 22–32)
Calcium: 9.4 mg/dL (ref 8.9–10.3)
Chloride: 98 mmol/L (ref 98–111)
Creatinine, Ser: 2.29 mg/dL — ABNORMAL HIGH (ref 0.44–1.00)
GFR, Estimated: 24 mL/min — ABNORMAL LOW (ref >60)
Glucose, Bld: 105 mg/dL — ABNORMAL HIGH (ref 70–99)
Potassium: 3.5 mmol/L (ref 3.5–5.1)
Sodium: 136 mmol/L (ref 135–145)

## 2021-01-03 LAB — CBC
HCT: 31.2 % — ABNORMAL LOW (ref 36.0–46.0)
Hemoglobin: 9.7 g/dL — ABNORMAL LOW (ref 12.0–15.0)
MCH: 31.3 pg (ref 26.0–34.0)
MCHC: 31.1 g/dL (ref 30.0–36.0)
MCV: 100.6 fL — ABNORMAL HIGH (ref 80.0–100.0)
Platelets: 123 10*3/uL — ABNORMAL LOW (ref 150–400)
RBC: 3.1 MIL/uL — ABNORMAL LOW (ref 3.87–5.11)
RDW: 16.5 % — ABNORMAL HIGH (ref 11.5–15.5)
WBC: 2.7 10*3/uL — ABNORMAL LOW (ref 4.0–10.5)
nRBC: 0 % (ref 0.0–0.2)

## 2021-01-03 LAB — PHOSPHORUS: Phosphorus: 3 mg/dL (ref 2.5–4.6)

## 2021-01-03 LAB — MAGNESIUM: Magnesium: 1.9 mg/dL (ref 1.7–2.4)

## 2021-01-03 MED ORDER — HYDRALAZINE HCL 20 MG/ML IJ SOLN
10.0000 mg | Freq: Once | INTRAMUSCULAR | Status: AC
  Administered 2021-01-03: 10 mg via INTRAVENOUS
  Filled 2021-01-03: qty 1

## 2021-01-03 MED ORDER — PREDNISONE 5 MG PO TABS
5.0000 mg | ORAL_TABLET | Freq: Every day | ORAL | 2 refills | Status: DC
Start: 2021-01-03 — End: 2021-02-14

## 2021-01-03 NOTE — Discharge Summary (Signed)
Physician Discharge Summary  Jodi Bell BVQ:[id number] DOB: 01-Sep-1960 DOA: 01/02/2021  PCP: [name]., MD  Admit date: 01/02/2021 Discharge date: 01/03/2021  Admitted From: Home  Discharge disposition: Home  Recommendations for Outpatient Follow-Up:   Follow up with your primary care provider in one week.  Check CBC, BMP, magnesium in the next visit Continue hemodialysis as scheduled  Discharge Diagnosis:   Principal Problem:   Acute exacerbation of CHF (congestive heart failure) (HCC) Active Problems:   Type 2 diabetes mellitus (HCC)   Essential hypertension   COPD (chronic obstructive pulmonary disease) (HCC)   ESRD (end stage renal disease) (HCC)   S/P CABG x 2  Discharge Condition: Improved.  Diet recommendation: Low sodium, heart healthy.  Carbohydrate-modified.    Wound care: None.  Code status: Full.   History of Present Illness:    Jodi Bell is a 60 y.o. female with past medical history of end-stage renal disease on hemodialysis, coronary artery disease with CABG on October 04, 2020, type 2 diabetes mellitus, hypertension presented to the hospital with shortness of breath.  Patient stated that she has been feeling short winded since discharge in August.  Patient goes to dialysis Monday, Wednesday and Fridays.  She noted that her pulse ox was low so she went to her primary care provider who referred her to the emergency department.   In the ED, patient was hypoxic on room air.  Chest x-ray did not show any evidence of edema. CTA of the chest did not show any evidence of pulmonary embolism. EKG showed normal sinus rhythm.  Patient was given amiodarone, nitroglycerin drip, amlodipine, clonidine and hydralazine initially.  WBC was slightly low at 3.3 hemoglobin 9.9 and platelets of 124 on presentation.  She had BNP elevated more than 4500.  Patient received 80 mg of IV Lasix .  Nephrology was notified and patient was then considered for admission to  hospital for further evaluation and treatment.   Hospital Course:   Following conditions were addressed during hospitalization as listed below,  Acute exacerbation of congestive heart failure.  Patient did have a basal crackles on presentation..  Improved significantly after hemodialysis.  Status post CABG x2. Makes minimal urine.  Patient has clinically improved..   End stage renal disease on hemodialysis.  Status post hemodialysis on presentation with significant improvement in shortness of breath.  Type 2 diabetes mellitus.  Diet controlled.  Essential hypertension.  Blood pressure high but is chronically high.  Resume medications from home including olmesartan labetalol isosorbide.    Severe hypomagnesemia.  Improved after replacement with magnesium sulfate.  Resume prior to discharge was 1.9.   COPD.Marland Kitchen  No active wheezing at the time of discharge  Disposition.  At this time, patient is stable for disposition home with outpatient PCP and nephrology follow-up  Medical Consultants:   Nephrology  Procedures:    Hemodialysis Subjective:   Today, patient was seen and examined at bedside.  Denies chest pain, dizziness, lightheadedness.  Feels much better with her shortness of breath and wishes to go home.  Off oxygen  Discharge Exam:   Vitals:   01/03/21 0500 01/03/21 0720  BP: (!) 196/78 (!) 187/85  Pulse: 75 76  Resp:  18  Temp:  98.3 F (36.8 C)  SpO2:  95%   Vitals:   01/03/21 0128 01/03/21 0247 01/03/21 0500 01/03/21 0720  BP: (!) 161/77 (!) 190/81 (!) 196/78 (!) 187/85  Pulse: 73 72 75 76  Resp: 18 17  18  Temp:  98.4 F (36.9 C)  98.3 F (36.8 C)  TempSrc: Oral Oral  Oral  SpO2: 100% 99%  95%  Weight:  59.5 kg    Height:       General: Alert awake, not in obvious distress HENT: pupils equally reacting to light,  No scleral pallor or icterus noted. Oral mucosa is moist.  Chest:  Clear breath sounds.  Diminished breath sounds bilaterally. No crackles or  wheezes.  CVS: S1 &S2 heard. No murmur.  Regular rate and rhythm. Abdomen: Soft, nontender, nondistended.  Bowel sounds are heard.   Extremities: No cyanosis, clubbing or edema.  Peripheral pulses are palpable. Psych: Alert, awake and oriented, normal mood CNS:  No cranial nerve deficits.  Power equal in all extremities.   Skin: Warm and dry.  No rashes noted.  The results of significant diagnostics from this hospitalization (including imaging, microbiology, ancillary and laboratory) are listed below for reference.     Diagnostic Studies:   CT Angio Chest PE W and/or Wo Contrast  Result Date: 01/02/2021 CLINICAL DATA:  On dialysis. Status post CABG 3 months ago. Shortness of breath. EXAM: CT ANGIOGRAPHY CHEST WITH CONTRAST TECHNIQUE: Multidetector CT imaging of the chest was performed using the standard protocol during bolus administration of intravenous contrast. Multiplanar CT image reconstructions and MIPs were obtained to evaluate the vascular anatomy. CONTRAST:  79mL OMNIPAQUE IOHEXOL 350 MG/ML SOLN COMPARISON:  Plain film 01/02/2021.  CT chest 09/23/2020 FINDINGS: Cardiovascular: The quality of this exam for evaluation of pulmonary embolism is good. The bolus is well timed. No evidence of pulmonary embolism. Aortic atherosclerosis. Moderate cardiomegaly with native coronary artery calcification. Median sternotomy. Pulmonary artery enlargement, outflow tract 3.8 cm Mediastinum/Nodes: Mediastinal edema which is likely related to fluid overload. This limits evaluation for thoracic adenopathy. Lungs/Pleura: Tiny bilateral pleural effusions. Smooth septal thickening at the lung bases is mild. Left greater than right base dependent subsegmental atelectasis. 4 mm left apical pulmonary nodule on 15/5, not readily apparent on 11/10/2019. Upper Abdomen: Reflux of contrast into the hepatic veins suggests elevated right heart pressures. Too small to characterize right hepatic lobe lesion. Normal imaged  portions of the spleen. Underdistended stomach. Musculoskeletal: Anasarca.  Renal osteodystrophy. Review of the MIP images confirms the above findings. IMPRESSION: 1.  No evidence of pulmonary embolism. 2. Congestive heart failure/fluid overload, with small bilateral pleural effusions and septal thickening. Anasarca. 3.  Aortic Atherosclerosis (ICD10-I70.0). 4. Pulmonary artery enlargement suggests pulmonary arterial hypertension. 5. 4 mm left apical pulmonary nodule. No follow-up needed if patient is low-risk. Non-contrast chest CT can be considered in 12 months if patient is high-risk. This recommendation follows the consensus statement: Guidelines for Management of Incidental Pulmonary Nodules Detected on CT Images: From the Fleischner Society 2017; Radiology 2017; 284:228-243. Electronically Signed   By: Abigail Miyamoto M.D.   On: 01/02/2021 12:22   DG Chest Port 1 View  Result Date: 01/02/2021 CLINICAL DATA:  Shortness of breath. EXAM: PORTABLE CHEST 1 VIEW COMPARISON:  November 07, 2020. FINDINGS: Stable cardiomegaly. Sternotomy wires are noted. Mild central pulmonary vascular congestion is noted. Possible minimal bibasilar pulmonary edema is noted. No pneumothorax or pleural effusion is noted. Bony thorax is unremarkable. IMPRESSION: Stable cardiomegaly with mild central pulmonary vascular congestion. Possible minimal bibasilar pulmonary edema may be present. Electronically Signed   By: Marijo Conception M.D.   On: 01/02/2021 10:00   ECHOCARDIOGRAM COMPLETE  Result Date: 01/02/2021    ECHOCARDIOGRAM REPORT   Patient Name:  Jodi Bell Date of Exam: 01/02/2021 Medical Rec #:  [id number]        Height:       62.0 in Accession #:    [id number]       Weight:       136.6 lb Date of Birth:  01/11/1961         BSA:          1.626 m Patient Age:    24 years         BP:           169/76 mmHg Patient Gender: F                HR:           66 bpm. Exam Location:  Inpatient Procedure: 2D Echo Indications:     acute diastolic chf  History:        Patient has prior history of Echocardiogram examinations, most                 recent 09/09/2020. Prior CABG, end stage renal disease and COPD;                 Risk Factors:Diabetes and Sleep Apnea.  Sonographer:    [name] RDCS Referring Phys: [id number] [place]  1. Left ventricular ejection fraction, by estimation, is 65 to 70%. The left ventricle has normal function. The left ventricle has no regional wall motion abnormalities. There is severe left ventricular hypertrophy. Left ventricular diastolic parameters  are consistent with Grade II diastolic dysfunction (pseudonormalization). Elevated left atrial pressure.  2. Right ventricular systolic function is mildly reduced. The right ventricular size is mildly enlarged. There is moderately elevated pulmonary artery systolic pressure. The estimated right ventricular systolic pressure is 38.3 mmHg.  3. Left atrial size was severely dilated.  4. Right atrial size was mildly dilated.  5. The mitral valve is degenerative. Mild mitral valve regurgitation. Mild mitral stenosis. MG 5 mmHg at 65bpm, MVA 1.6 cm^2 by continuity equation. Severe mitral annular calcification.  6. Tricuspid valve regurgitation is mild to moderate.  7. The aortic valve is tricuspid. There is moderate calcification of the aortic valve. Aortic valve regurgitation is not visualized. Moderate aortic valve stenosis. Vmax 3.2 m/s, MG 23 mmHg, AVA 1.2 cm^2, DI 0.5  8. The inferior vena cava is dilated in size with <50% respiratory variability, suggesting right atrial pressure of 15 mmHg. FINDINGS  Left Ventricle: Left ventricular ejection fraction, by estimation, is 65 to 70%. The left ventricle has normal function. The left ventricle has no regional wall motion abnormalities. The left ventricular internal cavity size was normal in size. There is  severe left ventricular hypertrophy. Left ventricular diastolic parameters are consistent with  Grade II diastolic dysfunction (pseudonormalization). Elevated left atrial pressure. Right Ventricle: The right ventricular size is mildly enlarged. Right vetricular wall thickness was not well visualized. Right ventricular systolic function is mildly reduced. There is moderately elevated pulmonary artery systolic pressure. The tricuspid  regurgitant velocity is 2.80 m/s, and with an assumed right atrial pressure of 15 mmHg, the estimated right ventricular systolic pressure is 33.8 mmHg. Left Atrium: Left atrial size was severely dilated. Right Atrium: Right atrial size was mildly dilated. Pericardium: Trivial pericardial effusion is present. Mitral Valve: The mitral valve is degenerative in appearance. Severe mitral annular calcification. Mild mitral valve regurgitation. Mild mitral valve stenosis. Tricuspid Valve: The tricuspid valve is normal in structure. Tricuspid valve regurgitation is mild to  moderate. Aortic Valve: The aortic valve is tricuspid. There is moderate calcification of the aortic valve. Aortic valve regurgitation is not visualized. Moderate aortic stenosis is present. Aortic valve mean gradient measures 23.0 mmHg. Aortic valve peak gradient  measures 42.0 mmHg. Aortic valve area, by VTI measures 1.16 cm. Pulmonic Valve: The pulmonic valve was not well visualized. Pulmonic valve regurgitation is not visualized. Aorta: The aortic root is normal in size and structure. Venous: The inferior vena cava is dilated in size with less than 50% respiratory variability, suggesting right atrial pressure of 15 mmHg. IAS/Shunts: The interatrial septum was not well visualized. Additional Comments: There is a small pleural effusion in the left lateral region.  LEFT VENTRICLE PLAX 2D LVIDd:         4.70 cm   Diastology LVIDs:         2.60 cm   LV e' medial:    6.20 cm/s LV PW:         1.70 cm   LV E/e' medial:  28.5 LV IVS:        1.30 cm   LV e' lateral:   8.81 cm/s LVOT diam:     1.70 cm   LV E/e' lateral: 20.1  LV SV:         80 LV SV Index:   49 LVOT Area:     2.27 cm  RIGHT VENTRICLE            IVC RV S prime:     9.03 cm/s  IVC diam: 2.50 cm TAPSE (M-mode): 0.9 cm LEFT ATRIUM             Index        RIGHT ATRIUM           Index LA diam:        4.60 cm 2.83 cm/m   RA Area:     19.50 cm LA Vol (A2C):   76.5 ml 47.06 ml/m  RA Volume:   58.30 ml  35.86 ml/m LA Vol (A4C):   89.5 ml 55.05 ml/m LA Biplane Vol: 82.9 ml 50.99 ml/m  AORTIC VALVE AV Area (Vmax):    1.11 cm AV Area (Vmean):   1.08 cm AV Area (VTI):     1.16 cm AV Vmax:           324.00 cm/s AV Vmean:          223.000 cm/s AV VTI:            0.687 m AV Peak Grad:      42.0 mmHg AV Mean Grad:      23.0 mmHg LVOT Vmax:         158.00 cm/s LVOT Vmean:        106.500 cm/s LVOT VTI:          0.351 m LVOT/AV VTI ratio: 0.51  AORTA Ao Root diam: 2.90 cm Ao Asc diam:  3.60 cm MITRAL VALVE                TRICUSPID VALVE MV Area (PHT): 3.42 cm     TR Peak grad:   31.4 mmHg MV Decel Time: 222 msec     TR Vmax:        280.00 cm/s MV E velocity: 177.00 cm/s MV A velocity: 111.00 cm/s  SHUNTS MV E/A ratio:  1.59         Systemic VTI:  0.35 m  Systemic Diam: 1.70 cm Oswaldo Milian MD Electronically signed by Oswaldo Milian MD Signature Date/Time: 01/02/2021/11:10:13 PM    Final      Labs:   Basic Metabolic Panel: Recent Labs  Lab 01/02/21 1006 01/02/21 1901 01/03/21 0214  NA 138  --  136  K 4.7  --  3.5  CL 98  --  98  CO2 31  --  31  GLUCOSE 88  --  105*  BUN 17  --  <5*  CREATININE 4.86* 5.69* 2.29*  CALCIUM 10.2  --  9.4  MG <0.5*  --  1.9  PHOS  --   --  3.0   GFR Estimated Creatinine Clearance: 20.7 mL/min (A) (by C-G formula based on SCr of 2.29 mg/dL (H)). Liver Function Tests: Recent Labs  Lab 01/02/21 1006  AST 19  ALT 13  ALKPHOS 161*  BILITOT 0.5  PROT 6.3*  ALBUMIN 3.9   No results for input(s): LIPASE, AMYLASE in the last 168 hours. No results for input(s): AMMONIA in the last 168  hours. Coagulation profile No results for input(s): INR, PROTIME in the last 168 hours.  CBC: Recent Labs  Lab 01/02/21 1006 01/02/21 1901 01/03/21 0214  WBC 3.3* 3.1* 2.7*  NEUTROABS 1.9  --   --   HGB 9.9* 10.3* 9.7*  HCT 31.9* 32.6* 31.2*  MCV 101.9* 101.2* 100.6*  PLT 124* 127* 123*   Cardiac Enzymes: No results for input(s): CKTOTAL, CKMB, CKMBINDEX, TROPONINI in the last 168 hours. BNP: Invalid input(s): POCBNP CBG: No results for input(s): GLUCAP in the last 168 hours. D-Dimer No results for input(s): DDIMER in the last 72 hours. Hgb A1c No results for input(s): HGBA1C in the last 72 hours. Lipid Profile No results for input(s): CHOL, HDL, LDLCALC, TRIG, CHOLHDL, LDLDIRECT in the last 72 hours. Thyroid function studies No results for input(s): TSH, T4TOTAL, T3FREE, THYROIDAB in the last 72 hours.  Invalid input(s): FREET3 Anemia work up No results for input(s): VITAMINB12, FOLATE, FERRITIN, TIBC, IRON, RETICCTPCT in the last 72 hours. Microbiology Recent Results (from the past 240 hour(s))  Resp Panel by RT-PCR (Flu A&B, Covid) Nasopharyngeal Swab     Status: None   Collection Time: 01/02/21 10:06 AM   Specimen: Nasopharyngeal Swab; Nasopharyngeal(NP) swabs in vial transport medium  Result Value Ref Range Status   SARS Coronavirus 2 by RT PCR NEGATIVE NEGATIVE Final    Comment: (NOTE) SARS-CoV-2 target nucleic acids are NOT DETECTED.  The SARS-CoV-2 RNA is generally detectable in upper respiratory specimens during the acute phase of infection. The lowest concentration of SARS-CoV-2 viral copies this assay can detect is 138 copies/mL. A negative result does not preclude SARS-Cov-2 infection and should not be used as the sole basis for treatment or other patient management decisions. A negative result may occur with  improper specimen collection/handling, submission of specimen other than nasopharyngeal swab, presence of viral mutation(s) within the areas  targeted by this assay, and inadequate number of viral copies(<138 copies/mL). A negative result must be combined with clinical observations, patient history, and epidemiological information. The expected result is Negative.  Fact Sheet for Patients:  EntrepreneurPulse.com.au  Fact Sheet for Healthcare Providers:  IncredibleEmployment.be  This test is no t yet approved or cleared by the [place] FDA and  has been authorized for detection and/or diagnosis of SARS-CoV-2 by FDA under an Emergency Use Authorization (EUA). This EUA will remain  in effect (meaning this test can be used) for the duration of the COVID-19  declaration under Section 564(b)(1) of the Act, 21 U.S.C.section 360bbb-3(b)(1), unless the authorization is terminated  or revoked sooner.       Influenza A by PCR NEGATIVE NEGATIVE Final   Influenza B by PCR NEGATIVE NEGATIVE Final    Comment: (NOTE) The Xpert Xpress SARS-CoV-2/FLU/RSV plus assay is intended as an aid in the diagnosis of influenza from Nasopharyngeal swab specimens and should not be used as a sole basis for treatment. Nasal washings and aspirates are unacceptable for Xpert Xpress SARS-CoV-2/FLU/RSV testing.  Fact Sheet for Patients: EntrepreneurPulse.com.au  Fact Sheet for Healthcare Providers: IncredibleEmployment.be  This test is not yet approved or cleared by the [place] FDA and has been authorized for detection and/or diagnosis of SARS-CoV-2 by FDA under an Emergency Use Authorization (EUA). This EUA will remain in effect (meaning this test can be used) for the duration of the COVID-19 declaration under Section 564(b)(1) of the Act, 21 U.S.C. section 360bbb-3(b)(1), unless the authorization is terminated or revoked.  Performed at [facility], [address] [place], [place] [address]      Discharge Instructions:   Discharge Instructions      (HEART FAILURE PATIENTS) Call MD:  Anytime you have any of the following symptoms: 1) 3 pound weight gain in 24 hours or 5 pounds in 1 week 2) shortness of breath, with or without a dry hacking cough 3) swelling in the hands, feet or stomach 4) if you have to sleep on extra pillows at night in order to breathe.   Complete by: As directed    Diet - low sodium heart healthy   Complete by: As directed    Discharge instructions   Complete by: As directed    Follow up with your primary care provider and cardiology as has been scheduled. Continue hemodialysis as scheduled. Continue to take medications from home. Seek medical attention for worsening symptoms.   Heart Failure patients record your daily weight using the same scale at the same time of day   Complete by: As directed    Increase activity slowly   Complete by: As directed    STOP any activity that causes chest pain, shortness of breath, dizziness, sweating, or exessive weakness   Complete by: As directed       Allergies as of 01/03/2021       Reactions   3-methyl-2-benzothiazolinone Hydrazone Other (See Comments)   Muscle weakness muscle cramping in legs Other reaction(s): Myalgias (Muscle Pain)   Banana Anaphylaxis, Swelling   TONGUE AND MOUTH SWELLING   Black Walnut Flavor Anaphylaxis   Walnuts   Hazelnut (filbert) Allergy Skin Test Anaphylaxis   Hazelnuts   Leflunomide And Related Other (See Comments)   Severe Headache   Lisinopril Swelling   Angioedema  Swelling of the face   No Healthtouch Food Allergies Anaphylaxis   Spicy Mustard 4.7.2021 Pt reports that she eats Regular Yellow Mustard   Other Other (See Comments), Anaphylaxis, Swelling   Cosentyx= angioedema Other reaction(s): Facial Edema (intolerance) Mouth itches and tongue swelling Hazel nuts and pecans also Walnuts Walnuts Renal failure Other reaction(s): Other Serotonin Syndrome  Serotonin syndrome  Tremors Other reaction(s):  Other Tremors Muscle weakness muscle cramping in legs Other reaction(s): Myalgias (Muscle Pain) Mouth itches and tongue swelling Hazel nuts and pecans also Walnuts   Pecan Extract Allergy Skin Test Anaphylaxis   Pecans   Pecan Nut (diagnostic) Anaphylaxis   Pecans   Trazodone And Nefazodone Other (See Comments)   Serotonin Syndrome  Tremors   Adalimumab Other (See Comments)   Blisters on abdomen =humira   Escitalopram Oxalate Other (See Comments)   Hand problems - Serotonin Syndrome    Ezetimibe Other (See Comments)   myalgias cramps   Secukinumab Swelling   Cosentyx = angiodema   Statins Other (See Comments)   Leg pains and weakness   Ferrlecit [na Ferric Gluc Cplx In Sucrose]    Not reaction given from HD   Ibuprofen Other (See Comments)   Renal Problems        Medication List     STOP taking these medications    feeding supplement Liqd   hydrocortisone 25 MG suppository Commonly known as: ANUSOL-HC   lidocaine-prilocaine cream Commonly known as: EMLA   sorbitol 70 % Soln       TAKE these medications    acetaminophen 500 MG tablet Commonly known as: TYLENOL Take 1,000 mg by mouth every 6 (six) hours as needed for mild pain.   allopurinol 100 MG tablet Commonly known as: ZYLOPRIM Take 100 mg by mouth every evening.   amiodarone 200 MG tablet Commonly known as: PACERONE Take 1 tablet (200 mg total) by mouth daily.   amLODipine 10 MG tablet Commonly known as: NORVASC Take 10 mg by mouth daily.   carboxymethylcellulose 1 % ophthalmic solution Place 1 drop into both eyes as needed (dry eyes).   cloNIDine 0.1 MG tablet Commonly known as: CATAPRES Take 1 tablet (0.1 mg total) by mouth 2 (two) times daily.   Darbepoetin Alfa 200 MCG/0.4ML Sosy injection Commonly known as: ARANESP Inject 0.4 mLs (200 mcg total) into the vein every Wednesday with hemodialysis.   EPINEPHrine 0.3 mg/0.3 mL Soaj injection Commonly known as: EpiPen Inject 0.3 mLs  (0.3 mg total) into the muscle once. What changed:  when to take this reasons to take this   folic acid 1 MG tablet Commonly known as: FOLVITE Take 1 tablet (1 mg total) by mouth daily.   gabapentin 300 MG capsule Commonly known as: Neurontin Take 1 capsule (300 mg total) by mouth at bedtime as needed (Neuropathic pain). What changed:  when to take this reasons to take this   heparin 1000 unit/mL Soln injection 3 mLs (3,000 Units total) by Dialysis route as needed (in dialysis as needed).   heparin 1000 unit/mL Soln injection 2 mLs (2,000 Units total) by Dialysis route as needed (in dialysis as needed).   heparin 1000 unit/mL Soln injection 1 mL (1,000 Units total) by Dialysis route as needed (in dialysis).   hydrALAZINE 100 MG tablet Commonly known as: APRESOLINE Take 1 tablet (100 mg total) by mouth every 8 (eight) hours.   isosorbide mononitrate 30 MG 24 hr tablet Commonly known as: IMDUR Take 1 tablet (30 mg total) by mouth daily.   labetalol 200 MG tablet Commonly known as: NORMODYNE TAKE 1 TABLET BY MOUTH THREE TIMES DAILY   LORazepam 1 MG tablet Commonly known as: ATIVAN Take 1 mg by mouth every 8 (eight) hours as needed for anxiety.   montelukast 10 MG tablet Commonly known as: SINGULAIR Take 10 mg by mouth daily with supper.   multivitamin Tabs tablet Take 1 tablet by mouth daily with supper.   olmesartan 40 MG tablet Commonly known as: BENICAR Take 40 mg by mouth every evening.   pantoprazole 40 MG tablet Commonly known as: PROTONIX Take 40 mg by mouth daily.   pentafluoroprop-tetrafluoroeth Aero Commonly known as: GEBAUERS Apply 1 application topically as needed (topical anesthesia for hemodialysis).  predniSONE 5 MG tablet Commonly known as: DELTASONE Take 1 tablet (5 mg total) by mouth daily with breakfast.   PROBIOTIC PO Take 1 capsule by mouth daily.   SPIRIVA RESPIMAT IN Inhale 2 puffs into the lungs daily.   Ventolin HFA 108 (90  Base) MCG/ACT inhaler Generic drug: albuterol Inhale 1-2 puffs into the lungs every 6 (six) hours as needed for shortness of breath. wheezing   Vitamin D3 25 MCG tablet Commonly known as: Vitamin D Take 1 tablet (1,000 Units total) by mouth every evening.        Follow-up Information     [name]., MD Follow up in 1 week(s).   Specialty: Internal Medicine Contact information: [address] [place] [address] [phone]         Pixie Casino, MD .   Specialty: Cardiology Contact information: [address] [place] [address] [phone]                  Time coordinating discharge: 39 minutes  Signed:  Shelisa Fern  [facility] 01/03/2021, 8:44 AM

## 2021-01-03 NOTE — Progress Notes (Signed)
Patient ID: Jodi Bell, female   DOB: 11/03/1960, 60 y.o.   MRN: [id number] [facility] Progress Note   Assessment/ Plan:   1.  Acute hypoxic respiratory failure: Secondary to volume overload/pulmonary edema with acute exacerbation of diastolic heart failure and appears to have improved with urgent hemodialysis yesterday for ultrafiltration (net -3.5 L). 2.  End-stage renal disease: Usually on a Monday/Wednesday/Friday schedule and underwent hemodialysis yesterday for additional ultrafiltration. 3.  Hypertension: Exacerbated by volume excess will monitor with ultrafiltration on hemodialysis and restarted back on antihypertensive therapy.  Blood pressures remain elevated and this raises concern about her outpatient adherence. 4.  Anemia of chronic kidney disease: Suspect that this is in part dilutional from volume overload and will monitor with hemodialysis/ultrafiltration and will restart ESA after better blood pressure control/as an outpatient. 5.  Secondary hyperparathyroidism: Calcium and phosphorus level within acceptable range, monitor on renal diet  Subjective:   Reports to be feeling better and without any shortness of breath this morning.  Significantly elevated blood pressures noted overnight   Objective:   BP (!) 196/78 (BP Location: Right Arm)   Pulse 75   Temp 98.4 F (36.9 C) (Oral)   Resp 17   Ht 5\' 2"  (1.575 m)   Wt 59.5 kg   SpO2 99%   BMI 23.99 kg/m   Physical Exam: Gen: Comfortably sitting up in bed, CMA at bedside checking vital signs CVS: Pulse regular rhythm, normal rate, S1 and S2 normal Resp: Clear to auscultation bilaterally without distinct rales or rhonchi Abd: Soft, flat, nontender, bowel sounds normal Ext: No lower extremity edema, intact dressings left brachiocephalic fistula  Labs: BMET Recent Labs  Lab 01/02/21 1006 01/02/21 1901 01/03/21 0214  NA 138  --  136  K 4.7  --  3.5  CL 98  --  98  CO2 31  --  31  GLUCOSE 88  --   105*  BUN 17  --  <5*  CREATININE 4.86* 5.69* 2.29*  CALCIUM 10.2  --  9.4  PHOS  --   --  3.0   CBC Recent Labs  Lab 01/02/21 1006 01/02/21 1901 01/03/21 0214  WBC 3.3* 3.1* 2.7*  NEUTROABS 1.9  --   --   HGB 9.9* 10.3* 9.7*  HCT 31.9* 32.6* 31.2*  MCV 101.9* 101.2* 100.6*  PLT 124* 127* 123*     Medications:     allopurinol  100 mg Oral QPM   amiodarone  200 mg Oral Daily   amLODipine  10 mg Oral Daily   Chlorhexidine Gluconate Cloth  6 each Topical Q0600   cholecalciferol  1,000 Units Oral QPM   fluticasone  1 spray Each Nare Daily   folic acid  1 mg Oral Daily   heparin  5,000 Units Subcutaneous Q8H   hydrALAZINE  100 mg Oral Q8H   ipratropium-albuterol  3 mL Nebulization Q6H   isosorbide mononitrate  30 mg Oral Daily   labetalol  200 mg Oral TID   montelukast  10 mg Oral Q supper   pantoprazole  40 mg Oral Daily   polyvinyl alcohol  1 drop Both Eyes QID   sodium chloride flush  3 mL Intravenous Q12H   Elmarie Shiley, MD 01/03/2021, 7:23 AM

## 2021-01-04 LAB — HEPATITIS B SURFACE ANTIBODY, QUANTITATIVE: Hep B S AB Quant (Post): 23.1 m[IU]/mL (ref >9.9)

## 2021-01-10 ENCOUNTER — Other Ambulatory Visit: Payer: Self-pay

## 2021-01-10 ENCOUNTER — Encounter: Payer: Self-pay | Admitting: Cardiology

## 2021-01-10 ENCOUNTER — Ambulatory Visit: Payer: Medicare PPO | Admitting: Cardiology

## 2021-01-10 VITALS — BP 140/60 | HR 67 | Ht 62.0 in | Wt 137.0 lb

## 2021-01-10 DIAGNOSIS — N185 Chronic kidney disease, stage 5: Secondary | ICD-10-CM

## 2021-01-10 DIAGNOSIS — Z951 Presence of aortocoronary bypass graft: Secondary | ICD-10-CM | POA: Diagnosis not present

## 2021-01-10 DIAGNOSIS — E1122 Type 2 diabetes mellitus with diabetic chronic kidney disease: Secondary | ICD-10-CM

## 2021-01-10 DIAGNOSIS — Z720 Tobacco use: Secondary | ICD-10-CM | POA: Diagnosis not present

## 2021-01-10 DIAGNOSIS — I350 Nonrheumatic aortic (valve) stenosis: Secondary | ICD-10-CM

## 2021-01-10 DIAGNOSIS — I050 Rheumatic mitral stenosis: Secondary | ICD-10-CM | POA: Insufficient documentation

## 2021-01-10 DIAGNOSIS — K921 Melena: Secondary | ICD-10-CM

## 2021-01-10 DIAGNOSIS — I480 Paroxysmal atrial fibrillation: Secondary | ICD-10-CM | POA: Insufficient documentation

## 2021-01-10 DIAGNOSIS — I25709 Atherosclerosis of coronary artery bypass graft(s), unspecified, with unspecified angina pectoris: Secondary | ICD-10-CM | POA: Insufficient documentation

## 2021-01-10 DIAGNOSIS — G4733 Obstructive sleep apnea (adult) (pediatric): Secondary | ICD-10-CM

## 2021-01-10 DIAGNOSIS — J438 Other emphysema: Secondary | ICD-10-CM

## 2021-01-10 DIAGNOSIS — I4891 Unspecified atrial fibrillation: Secondary | ICD-10-CM | POA: Insufficient documentation

## 2021-01-10 DIAGNOSIS — I342 Nonrheumatic mitral (valve) stenosis: Secondary | ICD-10-CM

## 2021-01-10 NOTE — Assessment & Plan Note (Addendum)
She is in sinus rhythm.  Prior EKG from last week personally reviewed and interpreted, sinus rhythm.  We will go ahead and stop her amiodarone 200 mg a day.  She had postoperative atrial fibrillation.

## 2021-01-10 NOTE — Assessment & Plan Note (Signed)
Mild mitral stenosis.  Continue to monitor.  Low mean gradient of approximately 5 mmHg.

## 2021-01-10 NOTE — Assessment & Plan Note (Signed)
On hemodialysis Monday Wednesday Friday.  Blood sugars have markedly improved with weight loss.

## 2021-01-10 NOTE — Assessment & Plan Note (Signed)
Improved with weight loss 

## 2021-01-10 NOTE — Assessment & Plan Note (Signed)
Calcified valve noted.  Murmur appreciated.  Moderate in intensity.  Echocardiogram reviewed.  Continue to monitor.  At some point may require TAVR.

## 2021-01-10 NOTE — Patient Instructions (Signed)
Medication Instructions:  Please discontinue your Amiodarone. Continue all other medications as listed.  *If you need a refill on your cardiac medications before your next appointment, please call your pharmacy*  You have been referred to Cardiac Rehab and will be contacted to be scheduled.  Follow-Up: At [facility], you and your health needs are our priority.  As part of our continuing mission to provide you with exceptional heart care, we have created designated Provider Care Teams.  These Care Teams include your primary Cardiologist (physician) and Advanced Practice Providers (APPs -  Physician Assistants and Nurse Practitioners) who all work together to provide you with the care you need, when you need it.  We recommend signing up for the patient portal called "MyChart".  Sign up information is provided on this After Visit Summary.  MyChart is used to connect with patients for Virtual Visits (Telemedicine).  Patients are able to view lab/test results, encounter notes, upcoming appointments, etc.  Non-urgent messages can be sent to your provider as well.   To learn more about what you can do with MyChart, go to [email or web address].    Your next appointment:   3 - 4 month(s)  The format for your next appointment:   In Person  Provider:   Robbie Lis, PA-C, Melina Copa, PA-C, Cecilie Kicks, NP, Ermalinda Barrios, PA-C, Christen Bame, NP, or Richardson Dopp, PA-C         Thank you for choosing [facility]!!

## 2021-01-10 NOTE — Assessment & Plan Note (Signed)
She quit smoking surrounding her bypass.

## 2021-01-10 NOTE — Progress Notes (Signed)
Cardiology Office Note:    Date:  01/10/2021   ID:  Jodi Bell, DOB [birth year], MRN [id number]  PCP:  [name]., MD   [facility] Providers Cardiologist:  Pixie Casino, MD     Referring MD: Gaye Pollack, MD     History of Present Illness:    Jodi Bell is a 60 y.o. female here to establish care following CABG times 2 on 09/13/20 by Dr. Cyndia Bent with a hx of arthritis, anxiety, anemia, asthma, back pain, here for the evaluation of the of coronary artery disease, congestive heart failure, and hypertension.  Today,  She is currently on dialysis on Mondays and Wednesday and Fridays. Her diabetes has improved after weight loss. She has sleep apnea but does not use a mask. She is going to buy a new cleaning system and will eventually get on. When she lays down, or walking around her blood pressure drops. She saw Dr. Cyndia Bent last month following bypass surgery. She currently takes IMDUR 30mg  has not been giving her any chest pain.  Her chest wound is slightly tender. She has been doing physical therapy at home.  Her left knee was partially replaced, and she is going to the orthopedics later today for assessment of her right knee. She adds that her lower back/spine has been giving her discomfort.  She used to smoke, but she currently no longer smokes following bypass.  She denies any palpitations, chest pain, or shortness of breath. No lightheadedness, headaches, syncope, orthopnea, PND, lower extremity edema or exertional symptoms.   Past Medical History:  Diagnosis Date   Anemia of chronic disease    Anxiety    Arthritis    oa and psoriatic ra   Asthma    Back pain, chronic    lower back   Candida infection finished tx 2 days ago   Chronic insomnia    COPD (chronic obstructive pulmonary disease) (HCC)    Coronary artery calcification seen on CT scan    Diabetes mellitus    type 2   Eczema    ESRD on dialysis [facility])    Essential hypertension    GERD  (gastroesophageal reflux disease)    Gout    History of hiatal hernia    Hypoalbuminemia    Hyponatremia    Mild aortic stenosis    Mitral regurgitation    Mitral stenosis    Obesity    PAD (peripheral artery disease) ([place])    a. externial iliac calcification seen on CT 04/2020.   Pneumonia few yrs ago x 2   Sleep apnea    Tobacco abuse    Upper GI bleed 05/2019    Past Surgical History:  Procedure Laterality Date   BACK SURGERY  2016   lower back fusion with cage   BIOPSY  09/23/2017   Procedure: BIOPSY;  Surgeon: Wonda Horner, MD;  Location: [facility];  Service: Endoscopy;;   BIOPSY  05/19/2019   Procedure: BIOPSY;  Surgeon: Otis Brace, MD;  Location: [place];  Service: Gastroenterology;;   CESAREAN SECTION  1990   x 1    COLONOSCOPY WITH PROPOFOL N/A 09/23/2017   Procedure: COLONOSCOPY WITH PROPOFOL Hemostatic clips placed;  Surgeon: Wonda Horner, MD;  [facility]: [facility];  Service: Endoscopy;  Laterality: N/A;   COLONOSCOPY WITH PROPOFOL N/A 10/02/2020   Procedure: COLONOSCOPY WITH PROPOFOL;  Surgeon: Arta Silence, MD;  Location: [place];  Service: Endoscopy;  Laterality: N/A;   CORONARY ARTERY BYPASS GRAFT  N/A 09/13/2020   Procedure: CORONARY ARTERY BYPASS GRAFTING (CABG), ON PUMP, TIMES TWO, USING LEFT INTERNAL MAMMARY ARTERY AND RIGHT ENDOSCOPICALLY HARVESTED GREATER SAPHENOUS VEIN;  Surgeon: Gaye Pollack, MD;  Location: [place];  Service: Open Heart Surgery;  Laterality: N/A;   DILATION AND CURETTAGE OF UTERUS  1988   ESOPHAGOGASTRODUODENOSCOPY N/A 09/22/2020   Procedure: ESOPHAGOGASTRODUODENOSCOPY (EGD);  Surgeon: Arta Silence, MD;  Location: [facility];  Service: Endoscopy;  Laterality: N/A;   ESOPHAGOGASTRODUODENOSCOPY (EGD) WITH PROPOFOL N/A 09/23/2017   Procedure: ESOPHAGOGASTRODUODENOSCOPY (EGD) WITH PROPOFOL;  Surgeon: Wonda Horner, MD;  [facility];  Service: Endoscopy;  Laterality: N/A;   ESOPHAGOGASTRODUODENOSCOPY (EGD)  WITH PROPOFOL N/A 05/19/2019   Procedure: ESOPHAGOGASTRODUODENOSCOPY (EGD) WITH PROPOFOL;  Surgeon: Otis Brace, MD;  Location: [place];  Service: Gastroenterology;  Laterality: N/A;   ESOPHAGOGASTRODUODENOSCOPY (EGD) WITH PROPOFOL N/A 10/02/2020   Procedure: ESOPHAGOGASTRODUODENOSCOPY (EGD) WITH PROPOFOL;  Surgeon: Arta Silence, MD;  Location: [place];  Service: Endoscopy;  Laterality: N/A;   GIVENS CAPSULE STUDY N/A 05/19/2019   Procedure: GIVENS CAPSULE STUDY;  Surgeon: Otis Brace, MD;  Location: [place];  Service: Gastroenterology;  Laterality: N/A;   HEMOSTASIS CLIP PLACEMENT  09/22/2020   Procedure: HEMOSTASIS CLIP PLACEMENT;  Surgeon: Arta Silence, MD;  Location: [place];  Service: Endoscopy;;   HEMOSTASIS CONTROL  09/22/2020   Procedure: HEMOSTASIS CONTROL;  Surgeon: Arta Silence, MD;  Location: [place];  Service: Endoscopy;;   HOT HEMOSTASIS N/A 05/19/2019   Procedure: HOT HEMOSTASIS (ARGON PLASMA COAGULATION/BICAP);  Surgeon: Otis Brace, MD;  Location: [facility] ENDOSCOPY;  Service: Gastroenterology;  Laterality: N/A;   LEFT HEART CATH AND CORONARY ANGIOGRAPHY N/A 09/10/2020   Procedure: LEFT HEART CATH AND CORONARY ANGIOGRAPHY;  Surgeon: Nelva Bush, MD;  Location: [place] [facility];  Service: Cardiovascular;  Laterality: N/A;   PARTIAL KNEE ARTHROPLASTY Left 11/12/2017   Procedure: left unicompartmental arthroplasty-medial;  Surgeon: Paralee Cancel, MD;  Location: [facility];  Service: Orthopedics;  Laterality: Left;  30min   SUBMUCOSAL INJECTION  09/23/2017   Procedure: SUBMUCOSAL INJECTION;  Surgeon: Wonda Horner, MD;  Location: [facility];  Service: Endoscopy;;  in colon   TEE WITHOUT CARDIOVERSION N/A 09/13/2020   Procedure: TRANSESOPHAGEAL ECHOCARDIOGRAM (TEE);  Surgeon: Gaye Pollack, MD;  Location: [place];  Service: Open Heart Surgery;  Laterality: N/A;   TUBAL LIGATION  1990    Current Medications: Current Meds  Medication Sig    acetaminophen (TYLENOL) 500 MG tablet Take 1,000 mg by mouth every 6 (six) hours as needed for mild pain.   allopurinol (ZYLOPRIM) 100 MG tablet Take 100 mg by mouth every evening.   amLODipine (NORVASC) 10 MG tablet Take 10 mg by mouth daily.   carboxymethylcellulose 1 % ophthalmic solution Place 1 drop into both eyes as needed (dry eyes).   cholecalciferol (VITAMIN D) 25 MCG tablet Take 1 tablet (1,000 Units total) by mouth every evening.   cloNIDine (CATAPRES) 0.1 MG tablet Take 1 tablet (0.1 mg total) by mouth 2 (two) times daily.   Darbepoetin Alfa (ARANESP) 200 MCG/0.4ML SOSY injection Inject 0.4 mLs (200 mcg total) into the vein every Wednesday with hemodialysis.   EPINEPHrine (EPIPEN) 0.3 mg/0.3 mL SOAJ injection Inject 0.3 mLs (0.3 mg total) into the muscle once.   folic acid (FOLVITE) 1 MG tablet Take 1 tablet (1 mg total) by mouth daily.   gabapentin (NEURONTIN) 300 MG capsule Take 1 capsule (300 mg total) by mouth at bedtime as needed (Neuropathic pain).   heparin  1000 unit/mL SOLN injection 3 mLs (3,000 Units total) by Dialysis route as needed (in dialysis as needed).   heparin 1000 unit/mL SOLN injection 2 mLs (2,000 Units total) by Dialysis route as needed (in dialysis as needed).   heparin 1000 unit/mL SOLN injection 1 mL (1,000 Units total) by Dialysis route as needed (in dialysis).   hydrALAZINE (APRESOLINE) 100 MG tablet Take 1 tablet (100 mg total) by mouth every 8 (eight) hours.   isosorbide mononitrate (IMDUR) 30 MG 24 hr tablet Take 1 tablet (30 mg total) by mouth daily.   labetalol (NORMODYNE) 200 MG tablet TAKE 1 TABLET BY MOUTH THREE TIMES DAILY   LORazepam (ATIVAN) 1 MG tablet Take 1 mg by mouth every 8 (eight) hours as needed for anxiety.   montelukast (SINGULAIR) 10 MG tablet Take 10 mg by mouth daily with supper.    multivitamin (RENA-VIT) TABS tablet Take 1 tablet by mouth daily with supper.   olmesartan (BENICAR) 40 MG tablet Take 40 mg by mouth every evening.    pantoprazole (PROTONIX) 40 MG tablet Take 40 mg by mouth daily.   pentafluoroprop-tetrafluoroeth (GEBAUERS) AERO Apply 1 application topically as needed (topical anesthesia for hemodialysis).   predniSONE (DELTASONE) 5 MG tablet Take 1 tablet (5 mg total) by mouth daily with breakfast.   Probiotic Product (PROBIOTIC PO) Take 1 capsule by mouth daily.   Tiotropium Bromide Monohydrate (SPIRIVA RESPIMAT IN) Inhale 2 puffs into the lungs daily.   VENTOLIN HFA 108 (90 Base) MCG/ACT inhaler Inhale 1-2 puffs into the lungs every 6 (six) hours as needed for shortness of breath. wheezing   [DISCONTINUED] amiodarone (PACERONE) 200 MG tablet Take 1 tablet (200 mg total) by mouth daily.     Allergies:   3-methyl-2-benzothiazolinone hydrazone, Banana, Black walnut flavor, Hazelnut (filbert) allergy skin test, Leflunomide and related, Lisinopril, No healthtouch food allergies, Other, Pecan extract allergy skin test, Pecan nut (diagnostic), Trazodone and nefazodone, Adalimumab, Escitalopram oxalate, Ezetimibe, Secukinumab, Statins, Ferrlecit [na ferric gluc cplx in sucrose], and Ibuprofen   Social History   Socioeconomic History   Marital status: Divorced    Spouse name: BASIL   Number of children: 3   Years of education: 1   Highest education level: GED or equivalent  Occupational History   Occupation: retired Corporate treasurer  Tobacco Use   Smoking status: Former    Packs/day: 0.50    Years: 40.00    Pack years: 20.00    Types: Cigarettes    Quit date: 08/2020    Years since quitting: 0.4   Smokeless tobacco: Never  Vaping Use   Vaping Use: Never used  Substance and Sexual Activity   Alcohol use: Yes    Comment: occ   Drug use: No   Sexual activity: Not on file  Other Topics Concern   Not on file  Social History Narrative   Not on file   Social Determinants of Health   Financial Resource Strain: Not on file  Food Insecurity: Not on file  Transportation Needs: Not on file  Physical Activity: Not  on file  Stress: Not on file  Social Connections: Not on file     Family History: The patient's family history includes Arthritis in her daughter and daughter; Asthma in her daughter; Diabetes in her sister; Heart disease in her sister and another family member; Lung cancer in her mother; Thyroid cancer in an other family member.  ROS:   Please see the history of present illness.    (+) Surgical site  wound mild tenderness (+) Lower back pain (+) Right knee discomfort All other systems reviewed and are negative.  EKGs/Labs/Other Studies Reviewed:    The following studies were reviewed today: Operative report  01/02/21 ECHO:   1. Left ventricular ejection fraction, by estimation, is 65 to 70%. The  left ventricle has normal function. The left ventricle has no regional  wall motion abnormalities. There is severe left ventricular hypertrophy.  Left ventricular diastolic parameters   are consistent with Grade II diastolic dysfunction (pseudonormalization).  Elevated left atrial pressure.   2. Right ventricular systolic function is mildly reduced. The right  ventricular size is mildly enlarged. There is moderately elevated  pulmonary artery systolic pressure. The estimated right ventricular  systolic pressure is 52.8 mmHg.   3. Left atrial size was severely dilated.   4. Right atrial size was mildly dilated.   5. The mitral valve is degenerative. Mild mitral valve regurgitation.  Mild mitral stenosis. MG 5 mmHg at 65bpm, MVA 1.6 cm^2 by continuity  equation. Severe mitral annular calcification.   6. Tricuspid valve regurgitation is mild to moderate.   7. The aortic valve is tricuspid. There is moderate calcification of the  aortic valve. Aortic valve regurgitation is not visualized. Moderate  aortic valve stenosis. Vmax 3.2 m/s, MG 23 mmHg, AVA 1.2 cm^2, DI 0.5   8. The inferior vena cava is dilated in size with <50% respiratory  variability, suggesting right atrial pressure of 15  mmHg.   EKG:    01/10/2021: EKG was not ordered.  Recent Labs: 07/03/2020: TSH 4.375 01/02/2021: ALT 13; B Natriuretic Peptide >4,500.0 01/03/2021: BUN <5; Creatinine, Ser 2.29; Hemoglobin 9.7; Magnesium 1.9; Platelets 123; Potassium 3.5; Sodium 136  Recent Lipid Panel    Component Value Date/Time   CHOL 193 09/10/2020 0251   TRIG 60 09/10/2020 0251   HDL 69 09/10/2020 0251   CHOLHDL 2.8 09/10/2020 0251   VLDL 12 09/10/2020 0251   LDLCALC 112 (H) 09/10/2020 0251           Physical Exam:    VS:  BP 140/60 (BP Location: Right Arm, Patient Position: Sitting, Cuff Size: Normal)   Pulse 67   Ht 5\' 2"  (1.575 m)   Wt 137 lb (62.1 kg)   SpO2 97%   BMI 25.06 kg/m     Wt Readings from Last 3 Encounters:  01/10/21 137 lb (62.1 kg)  01/03/21 131 lb 2.8 oz (59.5 kg)  11/07/20 139 lb (63 kg)     GEN: Well nourished, well developed in no acute distress HEENT: Normal NECK: No JVD; bilateral carotid bruits LYMPHATICS: No lymphadenopathy CARDIAC: RRR, 3/6 systolic murmur, rubs, gallops  RESPIRATORY:  Clear to auscultation without rales, wheezing or rhonchi  ABDOMEN: Soft, non-tender, non-distended MUSCULOSKELETAL:  No edema; No deformity  SKIN: Warm and dry NEUROLOGIC:  Alert and oriented x 3 PSYCHIATRIC:  Normal affect   ASSESSMENT:    1. S/P CABG x 2   2. Coronary artery disease involving coronary bypass graft of native heart with angina pectoris ([place])   3. Other emphysema ([place])   4. Tobacco use   5. Paroxysmal atrial fibrillation (HCC)   6. Nonrheumatic aortic valve stenosis   7. Nonrheumatic mitral valve stenosis   8. 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 2 diabetes mellitus ([place])   9. OSA (obstructive sleep apnea)   10. Gastrointestinal hemorrhage with melena    PLAN:    Coronary artery disease involving coronary bypass graft of native heart with angina  pectoris ([place]) CABG times 2, 09/13/20 Dr. Cyndia Bent.  Overall doing well without any anginal symptoms.  Her isosorbide has been  decreased to 30 mg a day.  Excellent.  No anginal symptoms.  Still fatigued.  We will go ahead and refer her to cardiac rehab.  COPD (chronic obstructive pulmonary disease) ([place]) She quit smoking surrounding her bypass.  Tobacco use She quit smoking surrounding her bypass.  Paroxysmal atrial fibrillation (HCC) She is in sinus rhythm.  Prior EKG from last week personally reviewed and interpreted, sinus rhythm.  We will go ahead and stop her amiodarone 200 mg a day.  She had postoperative atrial fibrillation.  Aortic stenosis Calcified valve noted.  Murmur appreciated.  Moderate in intensity.  Echocardiogram reviewed.  Continue to monitor.  At some point may require TAVR.  Mitral stenosis Mild mitral stenosis.  Continue to monitor.  Low mean gradient of approximately 5 mmHg.  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 2 diabetes mellitus [facility]) On hemodialysis Monday Wednesday Friday.  Blood sugars have markedly improved with weight loss.  OSA (obstructive sleep apnea) She has not been utilizing CPAP mask because of infection she states.  GI bleeding Prior history of Duelofoy lesion postoperatively.  Diabetes mellitus with coincident hypertension ([place]) Improved with weight loss.    I    Cardiac Rehabilitation Eligibility Assessment  The patient is ready to start cardiac rehabilitation from a cardiac standpoint.      F/U 3-4 months with APP  Medication Adjustments/Labs and Tests Ordered: Current medicines are reviewed at length with the patient today.  Concerns regarding medicines are outlined above.  Orders Placed This Encounter  Procedures   AMB referral to cardiac rehabilitation   No orders of the defined types were placed in this encounter.   Patient Instructions  Medication Instructions:  Please discontinue your Amiodarone. Continue all other medications as listed.  *If you need a refill on your cardiac medications before your next appointment, please call your pharmacy*  You  have been referred to Cardiac Rehab and will be contacted to be scheduled.  Follow-Up: At [facility], you and your health needs are our priority.  As part of our continuing mission to provide you with exceptional heart care, we have created designated Provider Care Teams.  These Care Teams include your primary Cardiologist (physician) and Advanced Practice Providers (APPs -  Physician Assistants and Nurse Practitioners) who all work together to provide you with the care you need, when you need it.  We recommend signing up for the patient portal called "MyChart".  Sign up information is provided on this After Visit Summary.  MyChart is used to connect with patients for Virtual Visits (Telemedicine).  Patients are able to view lab/test results, encounter notes, upcoming appointments, etc.  Non-urgent messages can be sent to your provider as well.   To learn more about what you can do with MyChart, go to [email or web address].    Your next appointment:   3 - 4 month(s)  The format for your next appointment:   In Person  Provider:   Robbie Lis, PA-C, Melina Copa, PA-C, Cecilie Kicks, NP, Ermalinda Barrios, PA-C, Christen Bame, NP, or Richardson Dopp, PA-C         Thank you for choosing [facility]!!      Rondell Reams as a scribe for Candee Furbish, MD.,have documented all relevant documentation on the behalf of Candee Furbish, MD,as directed by  Candee Furbish, MD while in the presence of Candee Furbish, MD.  ICandee Furbish, MD, have reviewed all documentation for this visit. The documentation on 01/10/21 for the exam, diagnosis, procedures, and orders are all accurate and complete.   Signed, Candee Furbish, MD  01/10/2021 12:41 PM    [facility]

## 2021-01-10 NOTE — Assessment & Plan Note (Signed)
Prior history of Duelofoy lesion postoperatively.

## 2021-01-10 NOTE — Assessment & Plan Note (Addendum)
CABG times 2, 09/13/20 Dr. Cyndia Bent.  Overall doing well without any anginal symptoms.  Her isosorbide has been decreased to 30 mg a day.  Excellent.  No anginal symptoms.  Still fatigued.  We will go ahead and refer her to cardiac rehab.

## 2021-01-10 NOTE — Assessment & Plan Note (Signed)
She has not been utilizing CPAP mask because of infection she states.

## 2021-01-22 ENCOUNTER — Telehealth (HOSPITAL_COMMUNITY): Payer: Self-pay

## 2021-01-22 NOTE — Telephone Encounter (Signed)
Pt called back and stated that she would like to attend her cardiac rehab at [facility]. Will fax over pt info to [facility] cardiac rehab.

## 2021-02-02 ENCOUNTER — Other Ambulatory Visit: Payer: Self-pay | Admitting: Surgery

## 2021-02-06 ENCOUNTER — Other Ambulatory Visit: Payer: Self-pay | Admitting: Surgery

## 2021-02-10 ENCOUNTER — Other Ambulatory Visit: Payer: Self-pay | Admitting: Surgical

## 2021-02-13 ENCOUNTER — Observation Stay (HOSPITAL_BASED_OUTPATIENT_CLINIC_OR_DEPARTMENT_OTHER)
Admission: EM | Admit: 2021-02-13 | Discharge: 2021-02-14 | Disposition: A | Discharge status: Home / Self Care | Payer: Medicare PPO | Attending: Internal Medicine | Admitting: Internal Medicine

## 2021-02-13 ENCOUNTER — Emergency Department (HOSPITAL_BASED_OUTPATIENT_CLINIC_OR_DEPARTMENT_OTHER): Payer: Medicare PPO

## 2021-02-13 ENCOUNTER — Encounter (HOSPITAL_BASED_OUTPATIENT_CLINIC_OR_DEPARTMENT_OTHER): Payer: Self-pay

## 2021-02-13 ENCOUNTER — Other Ambulatory Visit: Payer: Self-pay

## 2021-02-13 DIAGNOSIS — U071 COVID-19: Secondary | ICD-10-CM | POA: Diagnosis not present

## 2021-02-13 DIAGNOSIS — J45909 Unspecified asthma, uncomplicated: Secondary | ICD-10-CM | POA: Diagnosis not present

## 2021-02-13 DIAGNOSIS — Z951 Presence of aortocoronary bypass graft: Secondary | ICD-10-CM | POA: Insufficient documentation

## 2021-02-13 DIAGNOSIS — E1122 Type 2 diabetes mellitus with diabetic chronic kidney disease: Secondary | ICD-10-CM | POA: Insufficient documentation

## 2021-02-13 DIAGNOSIS — I2510 Atherosclerotic heart disease of native coronary artery without angina pectoris: Secondary | ICD-10-CM | POA: Diagnosis not present

## 2021-02-13 DIAGNOSIS — Z87891 Personal history of nicotine dependence: Secondary | ICD-10-CM | POA: Diagnosis not present

## 2021-02-13 DIAGNOSIS — J449 Chronic obstructive pulmonary disease, unspecified: Secondary | ICD-10-CM | POA: Insufficient documentation

## 2021-02-13 DIAGNOSIS — Z96652 Presence of left artificial knee joint: Secondary | ICD-10-CM | POA: Insufficient documentation

## 2021-02-13 DIAGNOSIS — Z79899 Other long term (current) drug therapy: Secondary | ICD-10-CM | POA: Diagnosis not present

## 2021-02-13 DIAGNOSIS — N186 End stage renal disease: Secondary | ICD-10-CM | POA: Diagnosis not present

## 2021-02-13 DIAGNOSIS — I480 Paroxysmal atrial fibrillation: Secondary | ICD-10-CM

## 2021-02-13 DIAGNOSIS — J438 Other emphysema: Secondary | ICD-10-CM | POA: Diagnosis not present

## 2021-02-13 DIAGNOSIS — I4891 Unspecified atrial fibrillation: Secondary | ICD-10-CM | POA: Diagnosis present

## 2021-02-13 DIAGNOSIS — I10 Essential (primary) hypertension: Secondary | ICD-10-CM

## 2021-02-13 DIAGNOSIS — R0602 Shortness of breath: Secondary | ICD-10-CM

## 2021-02-13 DIAGNOSIS — I120 Hypertensive chronic kidney disease with stage 5 chronic kidney disease or end stage renal disease: Secondary | ICD-10-CM | POA: Insufficient documentation

## 2021-02-13 DIAGNOSIS — J441 Chronic obstructive pulmonary disease with (acute) exacerbation: Secondary | ICD-10-CM | POA: Diagnosis present

## 2021-02-13 DIAGNOSIS — E119 Type 2 diabetes mellitus without complications: Secondary | ICD-10-CM

## 2021-02-13 DIAGNOSIS — Z992 Dependence on renal dialysis: Secondary | ICD-10-CM | POA: Insufficient documentation

## 2021-02-13 LAB — CBC WITH DIFFERENTIAL/PLATELET
Abs Immature Granulocytes: 0.03 10*3/uL (ref 0.00–0.07)
Basophils Absolute: 0 10*3/uL (ref 0.0–0.1)
Basophils Relative: 0 %
Eosinophils Absolute: 0.1 10*3/uL (ref 0.0–0.5)
Eosinophils Relative: 3 %
HCT: 30.2 % — ABNORMAL LOW (ref 36.0–46.0)
Hemoglobin: 9.5 g/dL — ABNORMAL LOW (ref 12.0–15.0)
Immature Granulocytes: 1 %
Lymphocytes Relative: 11 %
Lymphs Abs: 0.4 10*3/uL — ABNORMAL LOW (ref 0.7–4.0)
MCH: 32.4 pg (ref 26.0–34.0)
MCHC: 31.5 g/dL (ref 30.0–36.0)
MCV: 103.1 fL — ABNORMAL HIGH (ref 80.0–100.0)
Monocytes Absolute: 0.4 10*3/uL (ref 0.1–1.0)
Monocytes Relative: 13 %
Neutro Abs: 2.4 10*3/uL (ref 1.7–7.7)
Neutrophils Relative %: 72 %
Platelets: 116 10*3/uL — ABNORMAL LOW (ref 150–400)
RBC: 2.93 MIL/uL — ABNORMAL LOW (ref 3.87–5.11)
RDW: 17.2 % — ABNORMAL HIGH (ref 11.5–15.5)
Smear Review: ADEQUATE
WBC: 3.3 10*3/uL — ABNORMAL LOW (ref 4.0–10.5)
nRBC: 0 % (ref 0.0–0.2)

## 2021-02-13 LAB — RESP PANEL BY RT-PCR (FLU A&B, COVID) ARPGX2
Influenza A by PCR: NEGATIVE
Influenza B by PCR: NEGATIVE
SARS Coronavirus 2 by RT PCR: POSITIVE — AB

## 2021-02-13 LAB — COMPREHENSIVE METABOLIC PANEL
ALT: 15 U/L (ref 0–44)
AST: 23 U/L (ref 15–41)
Albumin: 3.5 g/dL (ref 3.5–5.0)
Alkaline Phosphatase: 186 U/L — ABNORMAL HIGH (ref 38–126)
Anion gap: 10 (ref 5–15)
BUN: 33 mg/dL — ABNORMAL HIGH (ref 6–20)
CO2: 27 mmol/L (ref 22–32)
Calcium: 8.1 mg/dL — ABNORMAL LOW (ref 8.9–10.3)
Chloride: 96 mmol/L — ABNORMAL LOW (ref 98–111)
Creatinine, Ser: 6.66 mg/dL — ABNORMAL HIGH (ref 0.44–1.00)
GFR, Estimated: 7 mL/min — ABNORMAL LOW (ref >60)
Glucose, Bld: 90 mg/dL (ref 70–99)
Potassium: 5.6 mmol/L — ABNORMAL HIGH (ref 3.5–5.1)
Sodium: 133 mmol/L — ABNORMAL LOW (ref 135–145)
Total Bilirubin: 0.4 mg/dL (ref 0.3–1.2)
Total Protein: 6.6 g/dL (ref 6.5–8.1)

## 2021-02-13 LAB — CBG MONITORING, ED: Glucose-Capillary: 92 mg/dL (ref 70–99)

## 2021-02-13 MED ORDER — ISOSORBIDE MONONITRATE ER 30 MG PO TB24
30.0000 mg | ORAL_TABLET | Freq: Every day | ORAL | Status: DC
  Administered 2021-02-14: 30 mg via ORAL
  Filled 2021-02-13: qty 1

## 2021-02-13 MED ORDER — ALBUTEROL SULFATE HFA 108 (90 BASE) MCG/ACT IN AERS
2.0000 | INHALATION_SPRAY | Freq: Four times a day (QID) | RESPIRATORY_TRACT | Status: DC | PRN
  Administered 2021-02-13: 2 via RESPIRATORY_TRACT
  Filled 2021-02-13: qty 6.7

## 2021-02-13 MED ORDER — ACETAMINOPHEN 325 MG PO TABS
650.0000 mg | ORAL_TABLET | Freq: Once | ORAL | Status: AC
  Administered 2021-02-13: 650 mg via ORAL
  Filled 2021-02-13: qty 2

## 2021-02-13 MED ORDER — HEPARIN SODIUM (PORCINE) 5000 UNIT/ML IJ SOLN
5000.0000 [IU] | Freq: Three times a day (TID) | INTRAMUSCULAR | Status: DC
  Administered 2021-02-13 – 2021-02-14 (×2): 5000 [IU] via SUBCUTANEOUS
  Filled 2021-02-13 (×2): qty 1

## 2021-02-13 MED ORDER — ONDANSETRON HCL 4 MG PO TABS: 4.0000 mg | ORAL_TABLET | Freq: Four times a day (QID) | ORAL | Status: DC | PRN

## 2021-02-13 MED ORDER — IPRATROPIUM-ALBUTEROL 0.5-2.5 (3) MG/3ML IN SOLN: 3.0000 mL | Freq: Once | RESPIRATORY_TRACT | Status: AC

## 2021-02-13 MED ORDER — ALBUTEROL SULFATE HFA 108 (90 BASE) MCG/ACT IN AERS
1.0000 | INHALATION_SPRAY | RESPIRATORY_TRACT | Status: DC | PRN
  Administered 2021-02-13: 2 via RESPIRATORY_TRACT
  Filled 2021-02-13: qty 6.7

## 2021-02-13 MED ORDER — CLONIDINE HCL 0.1 MG PO TABS
0.1000 mg | ORAL_TABLET | Freq: Two times a day (BID) | ORAL | Status: DC
  Administered 2021-02-13: 0.1 mg via ORAL
  Filled 2021-02-13: qty 1

## 2021-02-13 MED ORDER — SODIUM CHLORIDE 0.9 % IV SOLN: INTRAVENOUS | Status: DC | PRN

## 2021-02-13 MED ORDER — LABETALOL HCL 200 MG PO TABS
200.0000 mg | ORAL_TABLET | Freq: Three times a day (TID) | ORAL | Status: DC
  Administered 2021-02-13 – 2021-02-14 (×2): 200 mg via ORAL
  Filled 2021-02-13 (×2): qty 1

## 2021-02-13 MED ORDER — SODIUM CHLORIDE 0.9 % IV SOLN: 100.0000 mg | Freq: Every day | INTRAVENOUS | Status: DC

## 2021-02-13 MED ORDER — OXYCODONE HCL 5 MG PO TABS
5.0000 mg | ORAL_TABLET | Freq: Once | ORAL | Status: AC
  Administered 2021-02-13: 5 mg via ORAL
  Filled 2021-02-13: qty 1

## 2021-02-13 MED ORDER — SODIUM CHLORIDE 0.9 % IV SOLN
100.0000 mg | INTRAVENOUS | Status: AC
  Administered 2021-02-13 (×2): 100 mg via INTRAVENOUS

## 2021-02-13 MED ORDER — ALBUTEROL SULFATE HFA 108 (90 BASE) MCG/ACT IN AERS
1.0000 | INHALATION_SPRAY | RESPIRATORY_TRACT | Status: DC | PRN
  Filled 2021-02-13: qty 6.7

## 2021-02-13 MED ORDER — CHLORHEXIDINE GLUCONATE 4 % EX LIQD
1.0000 "application " | Freq: Every day | CUTANEOUS | Status: DC
  Filled 2021-02-13: qty 118

## 2021-02-13 MED ORDER — ONDANSETRON HCL 4 MG/2ML IJ SOLN: 4.0000 mg | Freq: Four times a day (QID) | INTRAMUSCULAR | Status: DC | PRN

## 2021-02-13 MED ORDER — ALBUTEROL SULFATE (2.5 MG/3ML) 0.083% IN NEBU: 2.5000 mg | INHALATION_SOLUTION | RESPIRATORY_TRACT | Status: DC | PRN

## 2021-02-13 MED ORDER — UMECLIDINIUM BROMIDE 62.5 MCG/ACT IN AEPB
1.0000 | INHALATION_SPRAY | Freq: Every day | RESPIRATORY_TRACT | Status: DC
  Filled 2021-02-13: qty 7

## 2021-02-13 MED ORDER — MOLNUPIRAVIR EUA 200MG CAPSULE
4.0000 | ORAL_CAPSULE | Freq: Two times a day (BID) | ORAL | Status: DC
  Administered 2021-02-14: 800 mg via ORAL
  Filled 2021-02-13: qty 4

## 2021-02-13 MED ORDER — ACETAMINOPHEN 325 MG PO TABS
650.0000 mg | ORAL_TABLET | Freq: Four times a day (QID) | ORAL | Status: DC | PRN
  Administered 2021-02-14 (×2): 650 mg via ORAL
  Filled 2021-02-13 (×2): qty 2

## 2021-02-13 MED ORDER — AMLODIPINE BESYLATE 10 MG PO TABS
10.0000 mg | ORAL_TABLET | Freq: Every day | ORAL | Status: DC
  Administered 2021-02-14: 10 mg via ORAL
  Filled 2021-02-13: qty 1

## 2021-02-13 MED ORDER — HYDROCOD POLST-CPM POLST ER 10-8 MG/5ML PO SUER: 5.0000 mL | Freq: Two times a day (BID) | ORAL | Status: DC | PRN

## 2021-02-13 MED ORDER — IPRATROPIUM-ALBUTEROL 0.5-2.5 (3) MG/3ML IN SOLN
RESPIRATORY_TRACT | Status: AC
  Administered 2021-02-13: 3 mL via RESPIRATORY_TRACT
  Filled 2021-02-13: qty 3

## 2021-02-13 MED ORDER — HYDRALAZINE HCL 50 MG PO TABS
100.0000 mg | ORAL_TABLET | Freq: Three times a day (TID) | ORAL | Status: DC
  Administered 2021-02-13 – 2021-02-14 (×3): 100 mg via ORAL
  Filled 2021-02-13 (×4): qty 2

## 2021-02-13 MED ORDER — GUAIFENESIN-DM 100-10 MG/5ML PO SYRP: 10.0000 mL | ORAL_SOLUTION | ORAL | Status: DC | PRN

## 2021-02-13 MED ORDER — SODIUM ZIRCONIUM CYCLOSILICATE 10 G PO PACK
10.0000 g | PACK | Freq: Once | ORAL | Status: AC
  Administered 2021-02-13: 10 g via ORAL
  Filled 2021-02-13: qty 1

## 2021-02-13 MED ORDER — PREDNISONE 5 MG PO TABS
5.0000 mg | ORAL_TABLET | Freq: Every day | ORAL | Status: DC
  Administered 2021-02-14: 5 mg via ORAL
  Filled 2021-02-13: qty 1

## 2021-02-13 MED ORDER — TIOTROPIUM BROMIDE MONOHYDRATE 2.5 MCG/ACT IN AERS: INHALATION_SPRAY | Freq: Every day | RESPIRATORY_TRACT | Status: DC

## 2021-02-13 MED ORDER — MOLNUPIRAVIR EUA 200MG CAPSULE: 4.0000 | ORAL_CAPSULE | Freq: Two times a day (BID) | ORAL | Status: DC

## 2021-02-13 MED ORDER — LABETALOL HCL 5 MG/ML IV SOLN
10.0000 mg | INTRAVENOUS | Status: DC | PRN
  Filled 2021-02-13 (×2): qty 4

## 2021-02-13 NOTE — ED Notes (Signed)
Pt with c/o headache 6/10, requesting tylenol.  EDP Zackowski provided verbal order for 650mg  tylenol.

## 2021-02-13 NOTE — H&P (Addendum)
History and Physical    Jodi Bell ENM:[id number] DOB: [birth year] DOA: 02/13/2021  PCP: [name]., MD  Patient coming from: Home  I have personally briefly reviewed patient's old medical records in [place]  Chief Complaint: SOB  HPI: Jodi Bell is a 61 y.o. female with medical history significant of ESRD on MWF HD, COPD on 2L O2 at baseline, HTN, DM2 (not on any meds that I can tell).  Pt presents to ED at [facility] with c/o fatigue, SOB, fever, cough, body aches.  Tm 100.6 at home.  Diarrhea.  No N/V.  Cough non productive.  No CP.  Was with daughter on Sun who tested positive for COVID-19 yesterday.  Pt has COVID and flu vaccines.   ED Course: Pt COVID-19 positive.  No increased O2 requirement from baseline.  EDP called hospitalist who Recd Molnuparavir and outpt treatment, but unable to arrange for dialysis and pt skipped dialysis to be in ED today.  K 5.6, given lokelma.   Review of Systems: As per HPI, otherwise all review of systems negative.  Past Medical History:  Diagnosis Date   Anemia of chronic disease    Anxiety    Arthritis    oa and psoriatic ra   Asthma    Back pain, chronic    lower back   Candida infection finished tx 2 days ago   Chronic insomnia    COPD (chronic obstructive pulmonary disease) (HCC)    Coronary artery calcification seen on CT scan    Diabetes mellitus    type 2   Eczema    ESRD on dialysis [facility])    Essential hypertension    GERD (gastroesophageal reflux disease)    Gout    History of hiatal hernia    Hypoalbuminemia    Hyponatremia    Mild aortic stenosis    Mitral regurgitation    Mitral stenosis    Obesity    PAD (peripheral artery disease) ([place])    a. externial iliac calcification seen on CT 04/2020.   Pneumonia few yrs ago x 2   Sleep apnea    Tobacco abuse    Upper GI bleed 05/2019    Past Surgical History:  Procedure Laterality Date   BACK SURGERY  2016   lower back fusion with cage    BIOPSY  09/23/2017   Procedure: BIOPSY;  Surgeon: Wonda Horner, MD;  Location: [facility];  Service: Endoscopy;;   BIOPSY  05/19/2019   Procedure: BIOPSY;  Surgeon: Otis Brace, MD;  Location: [place];  Service: Gastroenterology;;   CESAREAN SECTION  1990   x 1    COLONOSCOPY WITH PROPOFOL N/A 09/23/2017   Procedure: COLONOSCOPY WITH PROPOFOL Hemostatic clips placed;  Surgeon: Wonda Horner, MD;  Location: [facility];  Service: Endoscopy;  Laterality: N/A;   COLONOSCOPY WITH PROPOFOL N/A 10/02/2020   Procedure: COLONOSCOPY WITH PROPOFOL;  Surgeon: Arta Silence, MD;  Location: [place];  Service: Endoscopy;  Laterality: N/A;   CORONARY ARTERY BYPASS GRAFT N/A 09/13/2020   Procedure: CORONARY ARTERY BYPASS GRAFTING (CABG), ON PUMP, TIMES TWO, USING LEFT INTERNAL MAMMARY ARTERY AND RIGHT ENDOSCOPICALLY HARVESTED GREATER SAPHENOUS VEIN;  Surgeon: Gaye Pollack, MD;  Location: [place];  Service: Open Heart Surgery;  Laterality: N/A;   DILATION AND CURETTAGE OF UTERUS  1988   ESOPHAGOGASTRODUODENOSCOPY N/A 09/22/2020   Procedure: ESOPHAGOGASTRODUODENOSCOPY (EGD);  Surgeon: Arta Silence, MD;  Location: [facility];  Service: Endoscopy;  Laterality: N/A;   ESOPHAGOGASTRODUODENOSCOPY (  EGD) WITH PROPOFOL N/A 09/23/2017   Procedure: ESOPHAGOGASTRODUODENOSCOPY (EGD) WITH PROPOFOL;  Surgeon: Wonda Horner, MD;  [facility]: [facility];  Service: Endoscopy;  Laterality: N/A;   ESOPHAGOGASTRODUODENOSCOPY (EGD) WITH PROPOFOL N/A 05/19/2019   Procedure: ESOPHAGOGASTRODUODENOSCOPY (EGD) WITH PROPOFOL;  Surgeon: Otis Brace, MD;  Location: [facility];  Service: Gastroenterology;  Laterality: N/A;   ESOPHAGOGASTRODUODENOSCOPY (EGD) WITH PROPOFOL N/A 10/02/2020   Procedure: ESOPHAGOGASTRODUODENOSCOPY (EGD) WITH PROPOFOL;  Surgeon: Arta Silence, MD;  Location: [place];  Service: Endoscopy;  Laterality: N/A;   GIVENS CAPSULE STUDY N/A 05/19/2019   Procedure: GIVENS CAPSULE STUDY;   Surgeon: Otis Brace, MD;  Location: [place];  Service: Gastroenterology;  Laterality: N/A;   HEMOSTASIS CLIP PLACEMENT  09/22/2020   Procedure: HEMOSTASIS CLIP PLACEMENT;  Surgeon: Arta Silence, MD;  Location: [place];  Service: Endoscopy;;   HEMOSTASIS CONTROL  09/22/2020   Procedure: HEMOSTASIS CONTROL;  Surgeon: Arta Silence, MD;  Location: [place];  Service: Endoscopy;;   HOT HEMOSTASIS N/A 05/19/2019   Procedure: HOT HEMOSTASIS (ARGON PLASMA COAGULATION/BICAP);  Surgeon: Otis Brace, MD;  Location: [facility] ENDOSCOPY;  Service: Gastroenterology;  Laterality: N/A;   LEFT HEART CATH AND CORONARY ANGIOGRAPHY N/A 09/10/2020   Procedure: LEFT HEART CATH AND CORONARY ANGIOGRAPHY;  Surgeon: Nelva Bush, MD;  Location: [place] CV LAB;  Service: Cardiovascular;  Laterality: N/A;   PARTIAL KNEE ARTHROPLASTY Left 11/12/2017   Procedure: left unicompartmental arthroplasty-medial;  Surgeon: Paralee Cancel, MD;  Location: [facility];  Service: Orthopedics;  Laterality: Left;  1min   SUBMUCOSAL INJECTION  09/23/2017   Procedure: SUBMUCOSAL INJECTION;  Surgeon: Wonda Horner, MD;  Location: [facility];  Service: Endoscopy;;  in colon   TEE WITHOUT CARDIOVERSION N/A 09/13/2020   Procedure: TRANSESOPHAGEAL ECHOCARDIOGRAM (TEE);  Surgeon: Gaye Pollack, MD;  Location: [place];  Service: Open Heart Surgery;  Laterality: N/A;   TUBAL LIGATION  1990     reports that she quit smoking about 6 months ago. Her smoking use included cigarettes. She has a 20.00 pack-year smoking history. She has never used smokeless tobacco. She reports current alcohol use. She reports that she does not use drugs.  Allergies  Allergen Reactions   3-Methyl-2-Benzothiazolinone Hydrazone Other (See Comments)    Muscle weakness muscle cramping in legs Other reaction(s): Myalgias (Muscle Pain)   Banana Anaphylaxis and Swelling    TONGUE AND MOUTH SWELLING   Black Walnut Flavor Anaphylaxis    Walnuts    Hazelnut ([place]) Allergy Skin Test Anaphylaxis    Hazelnuts   Leflunomide And Related Other (See Comments)    Severe Headache   Lisinopril Swelling    Angioedema  Swelling of the face   No Healthtouch Food Allergies Anaphylaxis    Spicy Mustard 4.7.2021 Pt reports that she eats Regular Yellow Mustard   Other Other (See Comments), Anaphylaxis and Swelling    Cosentyx= angioedema  Other reaction(s): Facial Edema (intolerance) Mouth itches and tongue swelling Hazel nuts and pecans also Walnuts Walnuts Renal failure Other reaction(s): Other Serotonin Syndrome  Serotonin syndrome  Tremors Other reaction(s): Other Tremors Muscle weakness muscle cramping in legs Other reaction(s): Myalgias (Muscle Pain) Mouth itches and tongue swelling Hazel nuts and pecans also Walnuts    Pecan Extract Allergy Skin Test Anaphylaxis    Pecans    Pecan Nut (Diagnostic) Anaphylaxis    Pecans   Trazodone And Nefazodone Other (See Comments)    Serotonin Syndrome  Tremors   Adalimumab Other (See Comments)    Blisters on abdomen =humira  Escitalopram Oxalate Other (See Comments)    Hand problems - Serotonin Syndrome     Ezetimibe Other (See Comments)    myalgias cramps    Secukinumab Swelling    Cosentyx = angiodema    Statins Other (See Comments)    Leg pains and weakness   Ferrlecit [Na Ferric Gluc Cplx In Sucrose]     Not reaction given from HD   Ibuprofen Other (See Comments)    Renal Problems    Family History  Problem Relation Age of Onset   Lung cancer Mother    Diabetes Sister    Heart disease Sister    Asthma Daughter    Arthritis Daughter    Arthritis Daughter    Thyroid cancer Other        1/2 sister   Heart disease Other        1/2 sister     Prior to Admission medications   Medication Sig Start Date End Date Taking? Authorizing Provider  acetaminophen (TYLENOL) 500 MG tablet Take 1,000 mg by mouth every 6 (six) hours as needed for mild pain.    [provider]  allopurinol (ZYLOPRIM) 100 MG tablet Take 100 mg by mouth every evening.    [provider]  amLODipine (NORVASC) 10 MG tablet Take 10 mg by mouth daily.    [provider]  carboxymethylcellulose 1 % ophthalmic solution Place 1 drop into both eyes as needed (dry eyes).    [provider]  cholecalciferol (VITAMIN D) 25 MCG tablet Take 1 tablet (1,000 Units total) by mouth every evening. 10/04/20   Gold, Patrick Jupiter E, PA-C  cloNIDine (CATAPRES) 0.1 MG tablet Take 1 tablet (0.1 mg total) by mouth 2 (two) times daily. 10/04/20   [name], PA-C  Darbepoetin Alfa (ARANESP) 200 MCG/0.4ML SOSY injection Inject 0.4 mLs (200 mcg total) into the vein every Wednesday with hemodialysis. 10/10/20   Gold, Wilder Glade, PA-C  EPINEPHrine (EPIPEN) 0.3 mg/0.3 mL SOAJ injection Inject 0.3 mLs (0.3 mg total) into the muscle once. 09/30/12   Le, Thao P, DO  folic acid (FOLVITE) 1 MG tablet Take 1 tablet (1 mg total) by mouth daily. 10/04/20   Jadene Pierini E, PA-C  gabapentin (NEURONTIN) 300 MG capsule Take 1 capsule (300 mg total) by mouth at bedtime as needed (Neuropathic pain). 12/14/17 01/10/21  Dana Allan I, MD  heparin 1000 unit/mL SOLN injection 3 mLs (3,000 Units total) by Dialysis route as needed (in dialysis as needed). 10/04/20   Jadene Pierini E, PA-C  heparin 1000 unit/mL SOLN injection 2 mLs (2,000 Units total) by Dialysis route as needed (in dialysis as needed). 10/04/20   Jadene Pierini E, PA-C  heparin 1000 unit/mL SOLN injection 1 mL (1,000 Units total) by Dialysis route as needed (in dialysis). 10/04/20   Jadene Pierini E, PA-C  hydrALAZINE (APRESOLINE) 100 MG tablet Take 1 tablet (100 mg total) by mouth every 8 (eight) hours. 07/06/20   [name], MD  isosorbide mononitrate (IMDUR) 30 MG 24 hr tablet Take 1 tablet (30 mg total) by mouth daily. 12/11/20   Gaye Pollack, MD  labetalol (NORMODYNE) 200 MG tablet TAKE 1 TABLET BY MOUTH THREE TIMES DAILY 12/10/20   Gold,  Wayne E, PA-C  LORazepam (ATIVAN) 1 MG tablet Take 1 mg by mouth every 8 (eight) hours as needed for anxiety. 06/25/20   [provider]  montelukast (SINGULAIR) 10 MG tablet Take 10 mg by mouth daily with supper.  [provider]  multivitamin (RENA-VIT) TABS tablet Take 1 tablet by mouth daily with supper.    [provider]  olmesartan (BENICAR) 40 MG tablet Take 40 mg by mouth every evening. 05/06/19   [provider]  pantoprazole (PROTONIX) 40 MG tablet Take 40 mg by mouth daily. 06/09/20   [provider]  pentafluoroprop-tetrafluoroeth Landry Dyke) AERO Apply 1 application topically as needed (topical anesthesia for hemodialysis). 10/04/20   [name], PA-C  predniSONE (DELTASONE) 5 MG tablet Take 1 tablet (5 mg total) by mouth daily with breakfast. 01/03/21   Pokhrel, Corrie Mckusick, MD  Probiotic Product (PROBIOTIC PO) Take 1 capsule by mouth daily.    [provider]  Tiotropium Bromide Monohydrate (SPIRIVA RESPIMAT IN) Inhale 2 puffs into the lungs daily.    [provider]  VENTOLIN HFA 108 (90 Base) MCG/ACT inhaler Inhale 1-2 puffs into the lungs every 6 (six) hours as needed for shortness of breath. wheezing 06/13/15   [provider]    Physical Exam: Vitals:   02/13/21 1600 02/13/21 1700 02/13/21 1800 02/13/21 2046  BP: (!) 195/74 (!) 163/92 (!) 182/89 (!) 205/77  Pulse: 74 72 74 81  Resp: 18 (!) 21 17   Temp:    99.4 F (37.4 C)  TempSrc:    Oral  SpO2: 94% 96% 95% 100%  Weight:      Height:        Constitutional: NAD, Ill appearing Eyes: PERRL, lids and conjunctivae normal ENMT: Mucous membranes are moist. Posterior pharynx clear of any exudate or lesions.Normal dentition.  Neck: normal, supple, no masses, no thyromegaly Respiratory: clear to auscultation bilaterally, no wheezing, no crackles. Normal respiratory effort. No accessory muscle use.  Cardiovascular: Regular rate and rhythm, no murmurs / rubs  / gallops. No extremity edema. 2+ pedal pulses. No carotid bruits.  Abdomen: no tenderness, no masses palpated. No hepatosplenomegaly. Bowel sounds positive.  Musculoskeletal: no clubbing / cyanosis. No joint deformity upper and lower extremities. Good ROM, no contractures. Normal muscle tone.  Skin: no rashes, lesions, ulcers. No induration Neurologic: CN 2-12 grossly intact. Sensation intact, DTR normal. Strength 5/5 in all 4.  Psychiatric: Normal judgment and insight. Alert and oriented x 3. Normal mood.    Labs on Admission: I have personally reviewed following labs and imaging studies  CBC: Recent Labs  Lab 02/13/21 1147  WBC 3.3*  NEUTROABS 2.4  HGB 9.5*  HCT 30.2*  MCV 103.1*  PLT 229*   Basic Metabolic Panel: Recent Labs  Lab 02/13/21 1147  NA 133*  K 5.6*  CL 96*  CO2 27  GLUCOSE 90  BUN 33*  CREATININE 6.66*  CALCIUM 8.1*   GFR: Estimated Creatinine Clearance: 7.8 mL/min (A) (by C-G formula based on SCr of 6.66 mg/dL (H)). Liver Function Tests: Recent Labs  Lab 02/13/21 1147  AST 23  ALT 15  ALKPHOS 186*  BILITOT 0.4  PROT 6.6  ALBUMIN 3.5   No results for input(s): LIPASE, AMYLASE in the last 168 hours. No results for input(s): AMMONIA in the last 168 hours. Coagulation Profile: No results for input(s): INR, PROTIME in the last 168 hours. Cardiac Enzymes: No results for input(s): CKTOTAL, CKMB, CKMBINDEX, TROPONINI in the last 168 hours. BNP (last 3 results) No results for input(s): PROBNP in the last 8760 hours. HbA1C: No results for input(s): HGBA1C in the last 72 hours. CBG: Recent Labs  Lab 02/13/21 1727  GLUCAP 92   Lipid Profile: No results for input(s): CHOL,  HDL, LDLCALC, TRIG, CHOLHDL, LDLDIRECT in the last 72 hours. Thyroid Function Tests: No results for input(s): TSH, T4TOTAL, FREET4, T3FREE, THYROIDAB in the last 72 hours. Anemia Panel: No results for input(s): VITAMINB12, FOLATE, FERRITIN, TIBC, IRON, RETICCTPCT in the last  72 hours. Urine analysis:    Component Value Date/Time   COLORURINE YELLOW 09/12/2020 2333   APPEARANCEUR HAZY (A) 09/12/2020 2333   LABSPEC 1.012 09/12/2020 2333   PHURINE 8.0 09/12/2020 2333   GLUCOSEU NEGATIVE 09/12/2020 2333   HGBUR SMALL (A) 09/12/2020 2333   BILIRUBINUR NEGATIVE 09/12/2020 2333   KETONESUR NEGATIVE 09/12/2020 2333   PROTEINUR 100 (A) 09/12/2020 2333   UROBILINOGEN 0.2 09/05/2014 0013   NITRITE NEGATIVE 09/12/2020 2333   LEUKOCYTESUR NEGATIVE 09/12/2020 2333    Radiological Exams on Admission: DG Chest Port 1 View  Result Date: 02/13/2021 CLINICAL DATA:  SOB.  Cough.  COVID positive. EXAM: PORTABLE CHEST 1 VIEW COMPARISON:  01/02/21 FINDINGS: Previous median sternotomy and CABG procedure stable mild cardiac enlargement. Diffuse pulmonary vascular congestion is identified. Blunting of the right costophrenic angle is suspicious for small right pleural effusion. No airspace consolidation identified. IMPRESSION: 1. Suspect mild CHF. 2. No signs of pneumonia. Electronically Signed   By: Kerby Moors M.D.   On: 02/13/2021 11:47    EKG: Independently reviewed.  Assessment/Plan Principal Problem:   COVID-19 Active Problems:   Diabetes mellitus with coincident hypertension (HCC)   Essential hypertension   COPD (chronic obstructive pulmonary disease) (HCC)   ESRD (end stage renal disease) (HCC)   Paroxysmal atrial fibrillation (HCC)    COVID-19 No increased O2 requirement Got remdesivir in ED Will switch to molnupiravir COVID pathway Daily labs ESRD - mild hyperkalemia Pt got admitted because they couldn't arrange for outpt HD today Unfortunately also unable to arrange for IP HD until tomorrow. Got dose of lokelma for K 5.6 Repeat K now Repeat labs in AM Tele monitor HTN - Resume home BP meds But will hold ARB in setting of hyperkalemia PRN labetalol IV for now COPD - PRN albuterol Cont home nebs Cont 2L O2 Cont 5mg  daily chronic prednisone DM2  - Not on home meds that I can tell Cont diet control PAF - Tele monitor Long term sinus rhythm EKG today again shows sinus rhythm Doesn't look like they have her on long term AC. Indeed per Cards 01/10/21 note: Only had post-op A.Fib They stopped her amiodarone at that time due to long term NSR. Also: H/O GIB from [place] lesion post-op CAD s/p CABG - CABG in Aug 2022 Resume home meds when med-rec completed.  DVT prophylaxis: Heparin [place] Code Status: Full Family Communication: No family in room Disposition Plan: Likely okay to go home after dialysis. Consults called: Sent message to Dr. Johnney Ou for AM nephro consult Admission status: Place in obs    Winfield Caba, [place] [facility]  How to contact the [facility] Attending or Consulting provider [place] or covering provider during after hours [place], for this patient?  Check the care team in [facility] and look for a) attending/consulting [facility] provider listed and b) the [facility] team listed Log into [email or web address]  Amion Physician Scheduling and messaging for groups and whole hospitals  On call and physician scheduling software for group practices, residents, hospitalists and other medical providers for call, clinic, rotation and shift schedules. On[employer] is a hospital-wide system for scheduling doctors and paging doctors on call. EasyPlot is for scientific plotting and data analysis.  [email or web address]  and use  [place]'s universal password to access. If you do not have the password, please contact the hospital operator.  Locate the [facility] provider you are looking for under [facility] and page to a number that you can be directly reached. If you still have difficulty reaching the provider, please page the [facility] (Director on Call) for the Hospitalists listed on amion for assistance.  02/13/2021, 10:02 PM

## 2021-02-13 NOTE — Progress Notes (Incomplete)
Pt admitted for COPD exacerbation/COVID positive. Pt BP elevated upon admission BP (!) 205/77 (BP Location: Right Arm)    Pulse 81    Temp 99.4 F (37.4 C) (Oral)    Resp 17    Ht 5\' 2"  (1.575 m)    Wt 62 kg    SpO2 100%    BMI 25.00 kg/m  Admitting Dr. Ree Kida immediately waiting orders. Pt also has slight elevated temperature.

## 2021-02-13 NOTE — ED Triage Notes (Addendum)
Pt arrives with c/o cough, body aches, and shortness of breath starting yesterday. Pt reports being around her daughter who is now Covid positive. Pt took a home test that was negative yesterday. Pt states that she does use oxygen as needed at home. Upon arrival pt room air 94%, in triage 91%. RT at bedside. Pt placed on 2 L . Pt is on dialysis states that she had her full treatment on Monday and did not go today because she was feeling bad.

## 2021-02-13 NOTE — Progress Notes (Signed)
RT was not made aware during first assessment that patient wears O2 at home. SAT 91% in triage, so I placed her on 2L per home reg. Rt to monitor.

## 2021-02-13 NOTE — Progress Notes (Addendum)
Plan of Care Note for accepted transfer   Patient: LATARRA EAGLETON MRN: [id number]   DOA: 02/13/2021  Facility requesting transfer: [facility] Requesting Provider: Billy Fischer Reason for transfer: Patient with h/o ESRD on HD; COPD on 2L home O2; DM; HTN; PAD; CAD s/p CABG; and OSA presenting with COVID symptoms and COVID +.  K+ 5.6.  She does not have an increased O2 requirement at this time  Plan of care: Based on extremely high COVID and inpatient numbers, I have recommended a dose of Lokelma; molnupiravir or other outpatient COVID treatment; and discharge to home if able to arrange for outpatient HD today.  If this cannot be arranged, we can then accept the patient in transfer - but given the crisis of beds and high COVID numbers at this time, she is unlikely to get a bed soon.   Update: Unable to arrange for outpatient HD today.  There are no open HD beds available inpatient either.  Will accept for transfer when bed is available but I have explained that it may take hours for this to happen based on current lack of bed availability.   Author: Karmen Bongo, MD 02/13/2021  Check [email or web address] for on-call coverage.

## 2021-02-13 NOTE — ED Provider Notes (Signed)
[facility] DEPARTMENT Provider Note   CSN: [id number] Arrival date & time: 02/13/21  8250     History  Chief Complaint  Patient presents with   Shortness of Breath    Jodi Bell is a 61 y.o. female.  With past medical history of CAD s/p double bypass in August 2022, diabetes, asthma, COPD on 2 L [place], hypertension, ESRD on HD M/W/F who presents with 1 day of cough, body aches, and shortness of breath.  She also endorses fatigue and a fever to 100.6.  She has had diarrhea though no nausea or vomiting, or abdominal pain.  She endorses headache however no sinus pain, productive cough.  Denies chest pain.  She states that she was with her daughter on Sunday who tested positive for COVID-19 yesterday.  She has taken Tylenol and Delsym at home without much relief.  She has received flu and COVID-19 vaccines.  Patient denies worsening orthopnea, increased edema or abdominal tightness, weight gain, worsening of oxygen requirements from baseline of 2L [place].    Home Medications Prior to Admission medications   Medication Sig Start Date End Date Taking? Authorizing Provider  acetaminophen (TYLENOL) 500 MG tablet Take 1,000 mg by mouth every 6 (six) hours as needed for mild pain.    [provider]  allopurinol (ZYLOPRIM) 100 MG tablet Take 100 mg by mouth every evening.    [provider]  amLODipine (NORVASC) 10 MG tablet Take 10 mg by mouth daily.    [provider]  carboxymethylcellulose 1 % ophthalmic solution Place 1 drop into both eyes as needed (dry eyes).    [provider]  cholecalciferol (VITAMIN D) 25 MCG tablet Take 1 tablet (1,000 Units total) by mouth every evening. 10/04/20   Gold, Patrick Jupiter E, PA-C  cloNIDine (CATAPRES) 0.1 MG tablet Take 1 tablet (0.1 mg total) by mouth 2 (two) times daily. 10/04/20   [name], PA-C  Darbepoetin Alfa (ARANESP) 200 MCG/0.4ML SOSY injection Inject 0.4 mLs (200 mcg total) into the vein every  Wednesday with hemodialysis. 10/10/20   Gold, Wilder Glade, PA-C  EPINEPHrine (EPIPEN) 0.3 mg/0.3 mL SOAJ injection Inject 0.3 mLs (0.3 mg total) into the muscle once. 09/30/12   Le, Thao P, DO  folic acid (FOLVITE) 1 MG tablet Take 1 tablet (1 mg total) by mouth daily. 10/04/20   Jadene Pierini E, PA-C  gabapentin (NEURONTIN) 300 MG capsule Take 1 capsule (300 mg total) by mouth at bedtime as needed (Neuropathic pain). 12/14/17 01/10/21  Dana Allan I, MD  heparin 1000 unit/mL SOLN injection 3 mLs (3,000 Units total) by Dialysis route as needed (in dialysis as needed). 10/04/20   Jadene Pierini E, PA-C  heparin 1000 unit/mL SOLN injection 2 mLs (2,000 Units total) by Dialysis route as needed (in dialysis as needed). 10/04/20   Jadene Pierini E, PA-C  heparin 1000 unit/mL SOLN injection 1 mL (1,000 Units total) by Dialysis route as needed (in dialysis). 10/04/20   Jadene Pierini E, PA-C  hydrALAZINE (APRESOLINE) 100 MG tablet Take 1 tablet (100 mg total) by mouth every 8 (eight) hours. 07/06/20   [name], MD  isosorbide mononitrate (IMDUR) 30 MG 24 hr tablet Take 1 tablet (30 mg total) by mouth daily. 12/11/20   Gaye Pollack, MD  labetalol (NORMODYNE) 200 MG tablet TAKE 1 TABLET BY MOUTH THREE TIMES DAILY 12/10/20   Gold, Wayne E, PA-C  LORazepam (ATIVAN) 1 MG tablet Take 1 mg by mouth every 8 (eight)  hours as needed for anxiety. 06/25/20   [provider]  montelukast (SINGULAIR) 10 MG tablet Take 10 mg by mouth daily with supper.     [provider]  multivitamin (RENA-VIT) TABS tablet Take 1 tablet by mouth daily with supper.    [provider]  olmesartan (BENICAR) 40 MG tablet Take 40 mg by mouth every evening. 05/06/19   [provider]  pantoprazole (PROTONIX) 40 MG tablet Take 40 mg by mouth daily. 06/09/20   [provider]  pentafluoroprop-tetrafluoroeth Landry Dyke) AERO Apply 1 application topically as needed (topical anesthesia for hemodialysis). 10/04/20    [name], PA-C  predniSONE (DELTASONE) 5 MG tablet Take 1 tablet (5 mg total) by mouth daily with breakfast. 01/03/21   Pokhrel, Corrie Mckusick, MD  Probiotic Product (PROBIOTIC PO) Take 1 capsule by mouth daily.    [provider]  Tiotropium Bromide Monohydrate (SPIRIVA RESPIMAT IN) Inhale 2 puffs into the lungs daily.    [provider]  VENTOLIN HFA 108 (90 Base) MCG/ACT inhaler Inhale 1-2 puffs into the lungs every 6 (six) hours as needed for shortness of breath. wheezing 06/13/15   [provider]      Allergies    3-methyl-2-benzothiazolinone hydrazone, Banana, Black walnut flavor, Hazelnut (filbert) allergy skin test, Leflunomide and related, Lisinopril, No healthtouch food allergies, Other, Pecan extract allergy skin test, Pecan nut (diagnostic), Trazodone and nefazodone, Adalimumab, Escitalopram oxalate, Ezetimibe, Secukinumab, Statins, Ferrlecit [na ferric gluc cplx in sucrose], and Ibuprofen    Review of Systems   Negative unless stated in HPI.  Physical Exam Updated Vital Signs BP (!) 177/83    Pulse 70    Temp 98.4 F (36.9 C) (Oral)    Resp 18    Ht _0  (1.575 m)    Wt 62 kg    SpO2 98%    BMI 25.00 kg/m  Constitutional: Ill-appearing female, no acute distress noted. Ears: Bilateral TM and canal normal.  No tenderness noted. Nose: Bilateral nasal turbinates within normal limits. Throat: Pharynx negative for swelling, erythema, exudate.  Tonsils negative for exudate. Cardio: Regular rate and rhythm.  Systolic murmur noted. Pulm: Clear to auscultation bilaterally.  O2 sat 100% on 2 L [place] Abdomen: Soft, nondistended, mild tenderness over epigastric area. MSK: Negative for extremity edema. Skin: Skin is warm and dry. Neuro: Alert and oriented x3.  No focal deficit noted. Psych: Normal mood and affect.   ED Results / Procedures / Treatments   Labs (all labs ordered are listed, but only abnormal results are displayed) Labs Reviewed  RESP PANEL BY  RT-PCR (FLU A&B, COVID) ARPGX2 - Abnormal; Notable for the following components:      Result Value   SARS Coronavirus 2 by RT PCR POSITIVE (*)    All other components within normal limits  CBC WITH DIFFERENTIAL/PLATELET - Abnormal; Notable for the following components:   WBC 3.3 (*)    RBC 2.93 (*)    Hemoglobin 9.5 (*)    HCT 30.2 (*)    MCV 103.1 (*)    RDW 17.2 (*)    Platelets 116 (*)    Lymphs Abs 0.4 (*)    All other components within normal limits  COMPREHENSIVE METABOLIC PANEL - Abnormal; Notable for the following components:   Sodium 133 (*)    Potassium 5.6 (*)    Chloride 96 (*)    BUN 33 (*)    Creatinine, Ser 6.66 (*)    Calcium 8.1 (*)  Alkaline Phosphatase 186 (*)    GFR, Estimated 7 (*)    All other components within normal limits    EKG EKG Interpretation  Date/Time:  Wednesday February 13 2021 10:09:16 EST Ventricular Rate:  70 PR Interval:  189 QRS Duration: 93 QT Interval:  431 QTC Calculation: 466 R Axis:   -20 Text Interpretation: Sinus rhythm Probable left atrial enlargement Borderline left axis deviation Nonspecific T abnormalities, lateral leads No significant change since last tracing Confirmed by Gareth Morgan [phone]) on 02/13/2021 11:23:53 AM  Radiology DG Chest Port 1 View  Result Date: 02/13/2021 CLINICAL DATA:  SOB.  Cough.  COVID positive. EXAM: PORTABLE CHEST 1 VIEW COMPARISON:  01/02/21 FINDINGS: Previous median sternotomy and CABG procedure stable mild cardiac enlargement. Diffuse pulmonary vascular congestion is identified. Blunting of the right costophrenic angle is suspicious for small right pleural effusion. No airspace consolidation identified. IMPRESSION: 1. Suspect mild CHF. 2. No signs of pneumonia. Electronically Signed   By: Kerby Moors M.D.   On: 02/13/2021 11:47    Procedures None  Medications Ordered in ED Medications  sodium zirconium cyclosilicate (LOKELMA) packet 10 g (has no administration in time range)    ED  Course/ Medical Decision Making/ A&P Patient presents with 1 day of cough, shortness of breath, body aches, reported fever of 100.6 with positive COVID-19 exposure.  She has received both flu and COVID-19 vaccines.  Patient has multiple comorbidities including COPD, ESRD on HD, diabetes, CAD.  Differential includes COVID-19, influenza, other viral upper respiratory infection.  Patient is found to be COVID-19 positive.  Further labs reveal hyperkalemia 5.6, elevated creatinine 6.6, anemia 9.5.  Patient is persistently hypertensive and is missing HD today.  Work-up was concerning for positive COVID-19 infection, as well as macrocytic anemia with hemoglobin 9.5, hematocrit 30.2, thrombocytopenia, stomatocytes; CMP concerning for hyperkalemia 5.6, elevated BUN/creatinine 33/6.66, hypocalcemia 8.1, elevated alk phos 186; chest x-ray concerning for diffuse pulmonary vascular congestion and possible small right pleural effusion.  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  She is missing HD today and lab results are concerning for HD needs today.  Consult to hospitalist service for transfer to [facility] for admission for [id number] infection complicated by multiple comorbidities as well as nephrology for hemodialysis need.  Attempt was made to consult nephrology for outpatient dialysis seat, which was unsuccessful.  I discussed this case with my attending physician Dr. Billy Fischer who cosigned this note including patient's presenting symptoms, physical exam, and planned diagnostics and intervention.  Final Clinical Impression(s) / ED Diagnoses Final diagnoses:  SOB (shortness of breath)    Rx / DC Orders ED Discharge Orders     None          Farrel Gordon, DO 02/13/21 1418    Gareth Morgan, MD 02/13/21 2217

## 2021-02-13 NOTE — ED Notes (Signed)
Pt states she was exposed to covid from her daughter.  She has noticed increased cough with phlegm.   Pt states some DOE

## 2021-02-13 NOTE — Progress Notes (Signed)
RT checked SAT in waiting room. 94%, HR 78. Stated she started having flu like symptoms with SOB yesterday. No distress noted.

## 2021-02-14 ENCOUNTER — Encounter (HOSPITAL_COMMUNITY): Payer: Self-pay | Admitting: Internal Medicine

## 2021-02-14 DIAGNOSIS — N186 End stage renal disease: Secondary | ICD-10-CM | POA: Diagnosis not present

## 2021-02-14 DIAGNOSIS — U071 COVID-19: Secondary | ICD-10-CM | POA: Diagnosis not present

## 2021-02-14 LAB — CBC WITH DIFFERENTIAL/PLATELET
Abs Immature Granulocytes: 0.03 10*3/uL (ref 0.00–0.07)
Basophils Absolute: 0 10*3/uL (ref 0.0–0.1)
Basophils Relative: 0 %
Eosinophils Absolute: 0.1 10*3/uL (ref 0.0–0.5)
Eosinophils Relative: 2 %
HCT: 30.8 % — ABNORMAL LOW (ref 36.0–46.0)
Hemoglobin: 9.4 g/dL — ABNORMAL LOW (ref 12.0–15.0)
Immature Granulocytes: 1 %
Lymphocytes Relative: 8 %
Lymphs Abs: 0.4 10*3/uL — ABNORMAL LOW (ref 0.7–4.0)
MCH: 31.8 pg (ref 26.0–34.0)
MCHC: 30.5 g/dL (ref 30.0–36.0)
MCV: 104.1 fL — ABNORMAL HIGH (ref 80.0–100.0)
Monocytes Absolute: 0.5 10*3/uL (ref 0.1–1.0)
Monocytes Relative: 10 %
Neutro Abs: 3.8 10*3/uL (ref 1.7–7.7)
Neutrophils Relative %: 79 %
Platelets: 100 10*3/uL — ABNORMAL LOW (ref 150–400)
RBC: 2.96 MIL/uL — ABNORMAL LOW (ref 3.87–5.11)
RDW: 16.8 % — ABNORMAL HIGH (ref 11.5–15.5)
WBC: 4.7 10*3/uL (ref 4.0–10.5)
nRBC: 0 % (ref 0.0–0.2)

## 2021-02-14 LAB — COMPREHENSIVE METABOLIC PANEL
ALT: 12 U/L (ref 0–44)
AST: 17 U/L (ref 15–41)
Albumin: 2.8 g/dL — ABNORMAL LOW (ref 3.5–5.0)
Alkaline Phosphatase: 190 U/L — ABNORMAL HIGH (ref 38–126)
Anion gap: 10 (ref 5–15)
BUN: 41 mg/dL — ABNORMAL HIGH (ref 6–20)
CO2: 25 mmol/L (ref 22–32)
Calcium: 8.7 mg/dL — ABNORMAL LOW (ref 8.9–10.3)
Chloride: 98 mmol/L (ref 98–111)
Creatinine, Ser: 8 mg/dL — ABNORMAL HIGH (ref 0.44–1.00)
GFR, Estimated: 5 mL/min — ABNORMAL LOW (ref >60)
Glucose, Bld: 46 mg/dL — ABNORMAL LOW (ref 70–99)
Potassium: 5.6 mmol/L — ABNORMAL HIGH (ref 3.5–5.1)
Sodium: 133 mmol/L — ABNORMAL LOW (ref 135–145)
Total Bilirubin: 0.5 mg/dL (ref 0.3–1.2)
Total Protein: 5.5 g/dL — ABNORMAL LOW (ref 6.5–8.1)

## 2021-02-14 LAB — C-REACTIVE PROTEIN: CRP: 10 mg/dL — ABNORMAL HIGH (ref <1.0)

## 2021-02-14 LAB — CBC
HCT: 30.5 % — ABNORMAL LOW (ref 36.0–46.0)
Hemoglobin: 9.4 g/dL — ABNORMAL LOW (ref 12.0–15.0)
MCH: 32.8 pg (ref 26.0–34.0)
MCHC: 30.8 g/dL (ref 30.0–36.0)
MCV: 106.3 fL — ABNORMAL HIGH (ref 80.0–100.0)
Platelets: 111 10*3/uL — ABNORMAL LOW (ref 150–400)
RBC: 2.87 MIL/uL — ABNORMAL LOW (ref 3.87–5.11)
RDW: 16.6 % — ABNORMAL HIGH (ref 11.5–15.5)
WBC: 4 10*3/uL (ref 4.0–10.5)
nRBC: 0 % (ref 0.0–0.2)

## 2021-02-14 LAB — GLUCOSE, CAPILLARY: Glucose-Capillary: 71 mg/dL (ref 70–99)

## 2021-02-14 LAB — RENAL FUNCTION PANEL
Albumin: 2.9 g/dL — ABNORMAL LOW (ref 3.5–5.0)
Anion gap: 10 (ref 5–15)
BUN: 43 mg/dL — ABNORMAL HIGH (ref 6–20)
CO2: 25 mmol/L (ref 22–32)
Calcium: 8.6 mg/dL — ABNORMAL LOW (ref 8.9–10.3)
Chloride: 98 mmol/L (ref 98–111)
Creatinine, Ser: 7.93 mg/dL — ABNORMAL HIGH (ref 0.44–1.00)
GFR, Estimated: 5 mL/min — ABNORMAL LOW (ref >60)
Glucose, Bld: 38 mg/dL — CL (ref 70–99)
Phosphorus: 6.6 mg/dL — ABNORMAL HIGH (ref 2.5–4.6)
Potassium: 5.7 mmol/L — ABNORMAL HIGH (ref 3.5–5.1)
Sodium: 133 mmol/L — ABNORMAL LOW (ref 135–145)

## 2021-02-14 LAB — D-DIMER, QUANTITATIVE: D-Dimer, Quant: 0.55 ug/mL-FEU — ABNORMAL HIGH (ref 0.00–0.50)

## 2021-02-14 LAB — HEPATITIS B SURFACE ANTIBODY,QUALITATIVE: Hep B S Ab: REACTIVE — AB

## 2021-02-14 LAB — HEPATITIS B SURFACE ANTIGEN: Hepatitis B Surface Ag: NONREACTIVE

## 2021-02-14 MED ORDER — PREDNISONE 5 MG PO TABS: 5.0000 mg | ORAL_TABLET | Freq: Every day | ORAL | 2 refills | Status: DC

## 2021-02-14 MED ORDER — DEXTROSE 50 % IV SOLN
INTRAVENOUS | Status: AC
  Administered 2021-02-14: 50 mL
  Filled 2021-02-14: qty 50

## 2021-02-14 MED ORDER — DEXTROSE 50 % IV SOLN
25.0000 mL | Freq: Once | INTRAVENOUS | Status: AC
  Administered 2021-02-14: 25 mL via INTRAVENOUS

## 2021-02-14 MED ORDER — DEXAMETHASONE 6 MG PO TABS: 6.0000 mg | ORAL_TABLET | Freq: Every day | ORAL | 0 refills | Status: DC

## 2021-02-14 MED ORDER — HYDROCOD POLST-CPM POLST ER 10-8 MG/5ML PO SUER: 5.0000 mL | Freq: Two times a day (BID) | ORAL | 0 refills | Status: DC | PRN

## 2021-02-14 MED ORDER — MOLNUPIRAVIR EUA 200MG CAPSULE: 4.0000 | ORAL_CAPSULE | Freq: Two times a day (BID) | ORAL | 0 refills | Status: AC

## 2021-02-14 MED ORDER — DEXAMETHASONE 6 MG PO TABS: 6.0000 mg | ORAL_TABLET | Freq: Every day | ORAL | 0 refills | Status: AC

## 2021-02-14 NOTE — Progress Notes (Addendum)
Advised of pt's admissions at progression this am. Pt receives out-pt HD at [facility] on MWF. Contacted clinic and spoke to [facility]. Clinic advised pt is covid positive. Pt can resume MWF schedule tomorrow but will need to arrive at 7:30 for 7:50 chair time. Unable to reach pt via phone at this time. Will attempt to speak with pt once she returns from HD. Update provided to providers and treatment team. Clinic aware to expect pt tomorrow for treatment.   Melven Sartorius Renal Navigator [phone]  Addendum at 2:16 pm: Spoke to pt via phone to make her aware that she needs to be at [facility] tomorrow at 7:30 arrival/7:50 chair time. Pt agreeable to plan.Spoke to staff at clinic to conform pt's d/c for today and pt will resume tomorrow. Clinic has access to [facility] epic to print d/c summary.

## 2021-02-14 NOTE — Plan of Care (Signed)
Pt admitted for ARF. Pt A&OX4 BP (!) 199/77    Pulse 81    Temp 99.4 F (37.4 C) (Oral)    Resp 17    Ht 5\' 2"  (1.575 m)    Wt 62 kg    SpO2 100%    BMI 25.00 kg/m  MD made aware from initial BP. No pain or s/s of Renal Failure. Tele put on NSR with no complications. Call bell near by, 2/4 bed rails upa nd bed in .owest position.   Milta Deiters

## 2021-02-14 NOTE — Progress Notes (Signed)
DISCHARGE NOTE HOME Jodi Bell to be discharged Home per MD order. Discussed prescriptions and follow up appointments with the patient. Prescriptions given to patient; medication list explained in detail. Patient verbalized understanding.  Skin clean, dry and intact without evidence of skin break down, no evidence of skin tears noted. IV catheter discontinued intact. Site without signs and symptoms of complications. Dressing and pressure applied. Pt denies pain at the site currently. No complaints noted.  Patient free of lines, drains, and wounds.   An After Visit Summary (AVS) was printed and given to the patient. Patient escorted via wheelchair, and discharged home via private auto.  Berneta Levins, RN

## 2021-02-14 NOTE — Consult Note (Signed)
[facility]  INPATIENT CONSULTATION  Reason for Consultation: ESRD  Requesting Provider: Dr. Hurley Cisco  HPI: Jodi Bell is an 61 y.o. female with ESRD on HD ([facility] MWF), COPD, DM, HTN, Gout, h/o A fib (not on anticog) currently admitted for COVID and nephrology is consulted for management of ESRD/HD and assoc conditions.   Exposed to [place] over weekend and developed fever, cough, malaise, aches, small volume diarrhea yesterday.  Went to [place] +; given remdesivir.  Plans for d/c but dialysis couldn't be arranged so she was sent to [facility] for admission for dialysis which is planned for today.  She's been switched to molnupiravir.  She currently states she's feeling a bit better.    PMH: Past Medical History:  Diagnosis Date   Anemia of chronic disease    Anxiety    Arthritis    oa and psoriatic ra   Asthma    Back pain, chronic    lower back   Candida infection finished tx 2 days ago   Chronic insomnia    COPD (chronic obstructive pulmonary disease) (HCC)    Coronary artery calcification seen on CT scan    Diabetes mellitus    type 2   Eczema    ESRD on dialysis [facility])    Essential hypertension    GERD (gastroesophageal reflux disease)    Gout    History of hiatal hernia    Hypoalbuminemia    Hyponatremia    Mild aortic stenosis    Mitral regurgitation    Mitral stenosis    Obesity    PAD (peripheral artery disease) ([place])    a. externial iliac calcification seen on CT 04/2020.   Pneumonia few yrs ago x 2   Sleep apnea    Tobacco abuse    Upper GI bleed 05/2019   PSH: Past Surgical History:  Procedure Laterality Date   BACK SURGERY  2016   lower back fusion with cage   BIOPSY  09/23/2017   Procedure: BIOPSY;  Surgeon: Wonda Horner, MD;  Location: [facility];  Service: Endoscopy;;   BIOPSY  05/19/2019   Procedure: BIOPSY;  Surgeon: Otis Brace, MD;  Location: [place];  Service: Gastroenterology;;   CESAREAN SECTION   1990   x 1    COLONOSCOPY WITH PROPOFOL N/A 09/23/2017   Procedure: COLONOSCOPY WITH PROPOFOL Hemostatic clips placed;  Surgeon: Wonda Horner, MD;  [facility];  Service: Endoscopy;  Laterality: N/A;   COLONOSCOPY WITH PROPOFOL N/A 10/02/2020   Procedure: COLONOSCOPY WITH PROPOFOL;  Surgeon: Arta Silence, MD;  Location: [place];  Service: Endoscopy;  Laterality: N/A;   CORONARY ARTERY BYPASS GRAFT N/A 09/13/2020   Procedure: CORONARY ARTERY BYPASS GRAFTING (CABG), ON PUMP, TIMES TWO, USING LEFT INTERNAL MAMMARY ARTERY AND RIGHT ENDOSCOPICALLY HARVESTED GREATER SAPHENOUS VEIN;  Surgeon: Gaye Pollack, MD;  Location: [place];  Service: Open Heart Surgery;  Laterality: N/A;   DILATION AND CURETTAGE OF UTERUS  1988   ESOPHAGOGASTRODUODENOSCOPY N/A 09/22/2020   Procedure: ESOPHAGOGASTRODUODENOSCOPY (EGD);  Surgeon: Arta Silence, MD;  Location: [facility] ENDOSCOPY;  Service: Endoscopy;  Laterality: N/A;   ESOPHAGOGASTRODUODENOSCOPY (EGD) WITH PROPOFOL N/A 09/23/2017   Procedure: ESOPHAGOGASTRODUODENOSCOPY (EGD) WITH PROPOFOL;  Surgeon: Wonda Horner, MD;  [facility];  Service: Endoscopy;  Laterality: N/A;   ESOPHAGOGASTRODUODENOSCOPY (EGD) WITH PROPOFOL N/A 05/19/2019   Procedure: ESOPHAGOGASTRODUODENOSCOPY (EGD) WITH PROPOFOL;  Surgeon: Otis Brace, MD;  Location: [place];  Service: Gastroenterology;  Laterality: N/A;   ESOPHAGOGASTRODUODENOSCOPY (  EGD) WITH PROPOFOL N/A 10/02/2020   Procedure: ESOPHAGOGASTRODUODENOSCOPY (EGD) WITH PROPOFOL;  Surgeon: Arta Silence, MD;  Location: [place];  Service: Endoscopy;  Laterality: N/A;   GIVENS CAPSULE STUDY N/A 05/19/2019   Procedure: GIVENS CAPSULE STUDY;  Surgeon: Otis Brace, MD;  Location: [place];  Service: Gastroenterology;  Laterality: N/A;   HEMOSTASIS CLIP PLACEMENT  09/22/2020   Procedure: HEMOSTASIS CLIP PLACEMENT;  Surgeon: Arta Silence, MD;  Location: [place];  Service: Endoscopy;;    HEMOSTASIS CONTROL  09/22/2020   Procedure: HEMOSTASIS CONTROL;  Surgeon: Arta Silence, MD;  Location: [place];  Service: Endoscopy;;   HOT HEMOSTASIS N/A 05/19/2019   Procedure: HOT HEMOSTASIS (ARGON PLASMA COAGULATION/BICAP);  Surgeon: Otis Brace, MD;  Location: [facility] ENDOSCOPY;  Service: Gastroenterology;  Laterality: N/A;   LEFT HEART CATH AND CORONARY ANGIOGRAPHY N/A 09/10/2020   Procedure: LEFT HEART CATH AND CORONARY ANGIOGRAPHY;  Surgeon: Nelva Bush, MD;  Location: [place] CV LAB;  Service: Cardiovascular;  Laterality: N/A;   PARTIAL KNEE ARTHROPLASTY Left 11/12/2017   Procedure: left unicompartmental arthroplasty-medial;  Surgeon: Paralee Cancel, MD;  [facility];  Service: Orthopedics;  Laterality: Left;  56min   SUBMUCOSAL INJECTION  09/23/2017   Procedure: SUBMUCOSAL INJECTION;  Surgeon: Wonda Horner, MD;  [facility];  Service: Endoscopy;;  in colon   TEE WITHOUT CARDIOVERSION N/A 09/13/2020   Procedure: TRANSESOPHAGEAL ECHOCARDIOGRAM (TEE);  Surgeon: Gaye Pollack, MD;  Location: [place];  Service: Open Heart Surgery;  Laterality: N/A;   TUBAL LIGATION  1990    Past Medical History:  Diagnosis Date   Anemia of chronic disease    Anxiety    Arthritis    oa and psoriatic ra   Asthma    Back pain, chronic    lower back   Candida infection finished tx 2 days ago   Chronic insomnia    COPD (chronic obstructive pulmonary disease) (HCC)    Coronary artery calcification seen on CT scan    Diabetes mellitus    type 2   Eczema    ESRD on dialysis [facility])    Essential hypertension    GERD (gastroesophageal reflux disease)    Gout    History of hiatal hernia    Hypoalbuminemia    Hyponatremia    Mild aortic stenosis    Mitral regurgitation    Mitral stenosis    Obesity    PAD (peripheral artery disease) ([place])    a. externial iliac calcification seen on CT 04/2020.   Pneumonia few yrs ago x 2   Sleep apnea    Tobacco abuse    Upper GI bleed  05/2019    Medications:  I have reviewed the patient's current medications.  Medications Prior to Admission  Medication Sig Dispense Refill   acetaminophen (TYLENOL) 500 MG tablet Take 1,000 mg by mouth every 6 (six) hours as needed for mild pain.     allopurinol (ZYLOPRIM) 100 MG tablet Take 100 mg by mouth every evening.     amLODipine (NORVASC) 10 MG tablet Take 10 mg by mouth daily.     carboxymethylcellulose 1 % ophthalmic solution Place 1 drop into both eyes as needed (dry eyes).     cholecalciferol (VITAMIN D) 25 MCG tablet Take 1 tablet (1,000 Units total) by mouth every evening. 30 tablet 1   cloNIDine (CATAPRES) 0.1 MG tablet Take 1 tablet (0.1 mg total) by mouth 2 (two) times daily. 60 tablet 1   Darbepoetin Alfa (ARANESP) 200 MCG/0.4ML SOSY  injection Inject 0.4 mLs (200 mcg total) into the vein every Wednesday with hemodialysis. 1.68 mL    EPINEPHrine (EPIPEN) 0.3 mg/0.3 mL SOAJ injection Inject 0.3 mLs (0.3 mg total) into the muscle once. 2 Device 1   folic acid (FOLVITE) 1 MG tablet Take 1 tablet (1 mg total) by mouth daily. 30 tablet 1   gabapentin (NEURONTIN) 300 MG capsule Take 1 capsule (300 mg total) by mouth at bedtime as needed (Neuropathic pain). 30 capsule 2   heparin 1000 unit/mL SOLN injection 3 mLs (3,000 Units total) by Dialysis route as needed (in dialysis as needed).     heparin 1000 unit/mL SOLN injection 2 mLs (2,000 Units total) by Dialysis route as needed (in dialysis as needed).     heparin 1000 unit/mL SOLN injection 1 mL (1,000 Units total) by Dialysis route as needed (in dialysis).     hydrALAZINE (APRESOLINE) 100 MG tablet Take 1 tablet (100 mg total) by mouth every 8 (eight) hours. 90 tablet 0   isosorbide mononitrate (IMDUR) 30 MG 24 hr tablet Take 1 tablet (30 mg total) by mouth daily. 30 tablet 1   labetalol (NORMODYNE) 200 MG tablet TAKE 1 TABLET BY MOUTH THREE TIMES DAILY 270 tablet 1   LORazepam (ATIVAN) 1 MG tablet Take 1 mg by mouth every 8  (eight) hours as needed for anxiety.     montelukast (SINGULAIR) 10 MG tablet Take 10 mg by mouth daily with supper.      multivitamin (RENA-VIT) TABS tablet Take 1 tablet by mouth daily with supper.     olmesartan (BENICAR) 40 MG tablet Take 40 mg by mouth every evening.     pantoprazole (PROTONIX) 40 MG tablet Take 40 mg by mouth daily.     pentafluoroprop-tetrafluoroeth (GEBAUERS) AERO Apply 1 application topically as needed (topical anesthesia for hemodialysis).  0   predniSONE (DELTASONE) 5 MG tablet Take 1 tablet (5 mg total) by mouth daily with breakfast. 30 tablet 2   Probiotic Product (PROBIOTIC PO) Take 1 capsule by mouth daily.     Tiotropium Bromide Monohydrate (SPIRIVA RESPIMAT IN) Inhale 2 puffs into the lungs daily.     VENTOLIN HFA 108 (90 Base) MCG/ACT inhaler Inhale 1-2 puffs into the lungs every 6 (six) hours as needed for shortness of breath. wheezing      ALLERGIES:   Allergies  Allergen Reactions   3-Methyl-2-Benzothiazolinone Hydrazone Other (See Comments)    Muscle weakness muscle cramping in legs Other reaction(s): Myalgias (Muscle Pain)   Banana Anaphylaxis and Swelling    TONGUE AND MOUTH SWELLING   Black Walnut Flavor Anaphylaxis    Walnuts   Hazelnut ([place]) Allergy Skin Test Anaphylaxis    Hazelnuts   Leflunomide And Related Other (See Comments)    Severe Headache   Lisinopril Swelling    Angioedema  Swelling of the face   No Healthtouch Food Allergies Anaphylaxis    Spicy Mustard 4.7.2021 Pt reports that she eats Regular Yellow Mustard   Other Other (See Comments), Anaphylaxis and Swelling    Cosentyx= angioedema  Other reaction(s): Facial Edema (intolerance) Mouth itches and tongue swelling Hazel nuts and pecans also Walnuts Walnuts Renal failure Other reaction(s): Other Serotonin Syndrome  Serotonin syndrome  Tremors Other reaction(s): Other Tremors Muscle weakness muscle cramping in legs Other reaction(s): Myalgias (Muscle  Pain) Mouth itches and tongue swelling Hazel nuts and pecans also Walnuts    Pecan Extract Allergy Skin Test Anaphylaxis    Pecans    Pecan Nut (Diagnostic)  Anaphylaxis    Pecans   Trazodone And Nefazodone Other (See Comments)    Serotonin Syndrome  Tremors   Adalimumab Other (See Comments)    Blisters on abdomen =humira   Escitalopram Oxalate Other (See Comments)    Hand problems - Serotonin Syndrome     Ezetimibe Other (See Comments)    myalgias cramps    Secukinumab Swelling    Cosentyx = angiodema    Statins Other (See Comments)    Leg pains and weakness   Ferrlecit [Na Ferric Gluc Cplx In Sucrose]     Not reaction given from HD   Ibuprofen Other (See Comments)    Renal Problems    FAM HX: Family History  Problem Relation Age of Onset   Lung cancer Mother    Diabetes Sister    Heart disease Sister    Asthma Daughter    Arthritis Daughter    Arthritis Daughter    Thyroid cancer Other        1/2 sister   Heart disease Other        1/2 sister    Social History:   reports that she quit smoking about 6 months ago. Her smoking use included cigarettes. She has a 20.00 pack-year smoking history. She has never used smokeless tobacco. She reports current alcohol use. She reports that she does not use drugs.  ROS: 12 system ROS neg except per HPI above  Blood pressure (!) 184/70, pulse 72, temperature 99.5 F (37.5 C), resp. rate 17, height 5\' 2"  (1.575 m), weight 62 kg, SpO2 98 %. PHYSICAL EXAM: Gen: thin chronically ill but nontoxic appearing woman  Eyes: anicteric, EOMI ENT:MMM Neck: supple CV: RRR, no rub Abd: soft, nontender Lungs: normal WOB on 4L with a few scattered wheezes Extr: no edema, LUE AVF aneurysms with normal skin, +t/b Neuro: nonfocal Skin: warm and dry   Results for orders placed or performed during the hospital encounter of 02/13/21 (from the past 48 hour(s))  Resp Panel by RT-PCR (Flu A&B, Covid) Nasopharyngeal Swab     Status:  Abnormal   Collection Time: 02/13/21 10:24 AM   Specimen: Nasopharyngeal Swab; Nasopharyngeal(NP) swabs in vial transport medium  Result Value Ref Range   SARS Coronavirus 2 by RT PCR POSITIVE (A) NEGATIVE    Comment: (NOTE) SARS-CoV-2 target nucleic acids are DETECTED.  The SARS-CoV-2 RNA is generally detectable in upper respiratory specimens during the acute phase of infection. Positive results are indicative of the presence of the identified virus, but do not rule out bacterial infection or co-infection with other pathogens not detected by the test. Clinical correlation with patient history and other diagnostic information is necessary to determine patient infection status. The expected result is Negative.  Fact Sheet for Patients: EntrepreneurPulse.com.au  Fact Sheet for Healthcare Providers: IncredibleEmployment.be  This test is not yet approved or cleared by the [place] FDA and  has been authorized for detection and/or diagnosis of SARS-CoV-2 by FDA under an Emergency Use Authorization (EUA).  This EUA will remain in effect (meaning this test can be used) for the duration of  the COVID-19 declaration under Section 564(b)(1) of the A ct, 21 U.S.C. section 360bbb-3(b)(1), unless the authorization is terminated or revoked sooner.     Influenza A by PCR NEGATIVE NEGATIVE   Influenza B by PCR NEGATIVE NEGATIVE    Comment: (NOTE) The Xpert Xpress SARS-CoV-2/FLU/RSV plus assay is intended as an aid in the diagnosis of influenza from Nasopharyngeal swab specimens and should not  be used as a sole basis for treatment. Nasal washings and aspirates are unacceptable for Xpert Xpress SARS-CoV-2/FLU/RSV testing.  Fact Sheet for Patients: EntrepreneurPulse.com.au  Fact Sheet for Healthcare Providers: IncredibleEmployment.be  This test is not yet approved or cleared by the [place] FDA and has been  authorized for detection and/or diagnosis of SARS-CoV-2 by FDA under an Emergency Use Authorization (EUA). This EUA will remain in effect (meaning this test can be used) for the duration of the COVID-19 declaration under Section 564(b)(1) of the Act, 21 U.S.C. section 360bbb-3(b)(1), unless the authorization is terminated or revoked.  Performed at [facility], [address] [place], [place] [address]   CBC with Differential     Status: Abnormal   Collection Time: 02/13/21 11:47 AM  Result Value Ref Range   WBC 3.3 (L) 4.0 - 10.5 K/uL   RBC 2.93 (L) 3.87 - 5.11 MIL/uL   Hemoglobin 9.5 (L) 12.0 - 15.0 g/dL   HCT 30.2 (L) 36.0 - 46.0 %   MCV 103.1 (H) 80.0 - 100.0 fL   MCH 32.4 26.0 - 34.0 pg   MCHC 31.5 30.0 - 36.0 g/dL   RDW 17.2 (H) 11.5 - 15.5 %   Platelets 116 (L) 150 - 400 K/uL   nRBC 0.0 0.0 - 0.2 %   Neutrophils Relative % 72 %   Neutro Abs 2.4 1.7 - 7.7 K/uL   Lymphocytes Relative 11 %   Lymphs Abs 0.4 (L) 0.7 - 4.0 K/uL   Monocytes Relative 13 %   Monocytes Absolute 0.4 0.1 - 1.0 K/uL   Eosinophils Relative 3 %   Eosinophils Absolute 0.1 0.0 - 0.5 K/uL   Basophils Relative 0 %   Basophils Absolute 0.0 0.0 - 0.1 K/uL   Smear Review PLATELETS APPEAR ADEQUATE     Comment: PLATELET COUNT CONFIRMED BY SMEAR   Immature Granulocytes 1 %   Abs Immature Granulocytes 0.03 0.00 - 0.07 K/uL   Stomatocytes PRESENT     Comment: Performed at [facility], [address] [place], [place] [address]  Comprehensive metabolic panel     Status: Abnormal   Collection Time: 02/13/21 11:47 AM  Result Value Ref Range   Sodium 133 (L) 135 - 145 mmol/L   Potassium 5.6 (H) 3.5 - 5.1 mmol/L   Chloride 96 (L) 98 - 111 mmol/L   CO2 27 22 - 32 mmol/L   Glucose, Bld 90 70 - 99 mg/dL    Comment: Glucose reference range applies only to samples taken after fasting for at least 8 hours.   BUN 33 (H) 6 - 20 mg/dL   Creatinine, Ser 6.66 (H) 0.44 - 1.00 mg/dL   Calcium 8.1  (L) 8.9 - 10.3 mg/dL   Total Protein 6.6 6.5 - 8.1 g/dL   Albumin 3.5 3.5 - 5.0 g/dL   AST 23 15 - 41 U/L   ALT 15 0 - 44 U/L   Alkaline Phosphatase 186 (H) 38 - 126 U/L   Total Bilirubin 0.4 0.3 - 1.2 mg/dL   GFR, Estimated 7 (L) >60 mL/min    Comment: (NOTE) Calculated using the CKD-EPI Creatinine Equation (2021)    Anion gap 10 5 - 15    Comment: Performed at [facility], [address] [place], [place] [address]  POC CBG, ED     Status: None   Collection Time: 02/13/21  5:27 PM  Result Value Ref Range   Glucose-Capillary 92 70 -  99 mg/dL    Comment: Glucose reference range applies only to samples taken after fasting for at least 8 hours.  CBC with Differential/Platelet     Status: Abnormal   Collection Time: 02/14/21  3:21 AM  Result Value Ref Range   WBC 4.7 4.0 - 10.5 K/uL   RBC 2.96 (L) 3.87 - 5.11 MIL/uL   Hemoglobin 9.4 (L) 12.0 - 15.0 g/dL   HCT 30.8 (L) 36.0 - 46.0 %   MCV 104.1 (H) 80.0 - 100.0 fL   MCH 31.8 26.0 - 34.0 pg   MCHC 30.5 30.0 - 36.0 g/dL   RDW 16.8 (H) 11.5 - 15.5 %   Platelets 100 (L) 150 - 400 K/uL    Comment: Immature Platelet Fraction may be clinically indicated, consider ordering this additional test [id number] REPEATED TO VERIFY PLATELET COUNT CONFIRMED BY SMEAR    nRBC 0.0 0.0 - 0.2 %   Neutrophils Relative % 79 %   Neutro Abs 3.8 1.7 - 7.7 K/uL   Lymphocytes Relative 8 %   Lymphs Abs 0.4 (L) 0.7 - 4.0 K/uL   Monocytes Relative 10 %   Monocytes Absolute 0.5 0.1 - 1.0 K/uL   Eosinophils Relative 2 %   Eosinophils Absolute 0.1 0.0 - 0.5 K/uL   Basophils Relative 0 %   Basophils Absolute 0.0 0.0 - 0.1 K/uL   Immature Granulocytes 1 %   Abs Immature Granulocytes 0.03 0.00 - 0.07 K/uL    Comment: Performed at [facility], [address] [place], [place] [address]  C-reactive protein     Status: Abnormal   Collection Time: 02/14/21  3:21 AM  Result Value Ref Range   CRP 10.0 (H) <1.0 mg/dL    Comment: Performed at  [facility] [address], [place], [place] [address]  D-dimer, quantitative     Status: Abnormal   Collection Time: 02/14/21  3:21 AM  Result Value Ref Range   D-Dimer, Quant 0.55 (H) 0.00 - 0.50 ug/mL-FEU    Comment: (NOTE) At the manufacturer cut-off value of 0.5 g/mL FEU, this assay has a negative predictive value of 95-100%.This assay is intended for use in conjunction with a clinical pretest probability (PTP) assessment model to exclude pulmonary embolism (PE) and deep venous thrombosis (DVT) in outpatients suspected of PE or DVT. Results should be correlated with clinical presentation. Performed at [facility], [place] [address], [place], [place] [address]     DG Chest Port 1 View  Result Date: 02/13/2021 CLINICAL DATA:  SOB.  Cough.  COVID positive. EXAM: PORTABLE CHEST 1 VIEW COMPARISON:  01/02/21 FINDINGS: Previous median sternotomy and CABG procedure stable mild cardiac enlargement. Diffuse pulmonary vascular congestion is identified. Blunting of the right costophrenic angle is suspicious for small right pleural effusion. No airspace consolidation identified. IMPRESSION: 1. Suspect mild CHF. 2. No signs of pneumonia. Electronically Signed   By: Kerby Moors M.D.   On: 02/13/2021 11:47    Assessment/Plan **COVID 19 pneumonia:  high risk being treated with molnupiravir (after dose of remdesivir).  Vaccinated.  On baseline home supplemental O2.    **ESRD on HD:  missed HD due to ED COVID visit yesterday and make up not able to be scheduled leading to admission.  Plans for HD today, hopefully during day but may be evening depending on COVID shift/coverage.  Will need to make plans for outpt HD on discharge.  Renal diet, daily weights, I/Os.    **Hyperkalemia:  K 5.6  yesterday, given dose of lokelma.  Check labs this AM and repeat lokelma if K still elevated.   **Anemia:  Hb in the high 9s.  Obtain outpt ESA orders if remains admitted.  **Hypertension:  asymptomatic  HTN currently - missed several doses of meds in ED stay.  Follow with reinstitution of meds and UF with HD.    **COPD on home O2 **DM diet controlled **CAD s/p CABG 2022  Hopefully will be able to discharge after HD and complete rest of COVID treatment outpt with next HD outpt tomorrow per usual Schedule.   Justin Mend 02/14/2021, 5:53 AM

## 2021-02-14 NOTE — Discharge Summary (Signed)
Physician Discharge Summary   Jodi Bell XBJ:[id number] DOB: Jan 23, 1961 DOA: 02/13/2021  PCP: [name]., MD  Admit date: 02/13/2021 Discharge date:  02/14/2021  Admitted From: Home Disposition:  home Discharging physician: Dwyane Dee, MD  Recommendations for Outpatient Follow-up:  Continue outpatient HD  Home Health:  Equipment/Devices:   Discharge Condition: stable CODE STATUS: Full Diet recommendation:  Diet Orders (From admission, onward)     Start     Ordered   02/13/21 2217  Diet renal/carb modified with fluid restriction Diet-HS Snack? Nothing; Fluid restriction: 1200 mL Fluid; Room service appropriate? Yes; Fluid consistency: Thin  Diet effective now       Question Answer Comment  Diet-HS Snack? Nothing   Fluid restriction: 1200 mL Fluid   Room service appropriate? Yes   Fluid consistency: Thin      02/13/21 2216            Hospital Course:  Jodi Bell is a 61 year old female with PMH ESRD on HD, COPD on 2 L oxygen chronically, HTN, DM II who presented to [facility] with fatigue, shortness of breath, fever, cough, body aches.  She was found to have low-grade temperature and was unable to attend her dialysis session due to coming to the hospital.  She was diagnosed with COVID-19 after work-up and received 1 dose of remdesivir.  She was transferred to [facility] for undergoing dialysis which she was able to undergo on 02/14/2021.  She was transitioned to molnupiravir at discharge.  She was also started on a short course of Decadron given worsened oxygen demand initially on admission and will then resume her home prednisone after completion of Decadron course.   The patient's chronic medical conditions were treated accordingly per the patient's home medication regimen except as noted.  On day of discharge, patient was felt deemed stable for discharge. Patient/family member advised to call PCP or come back to ER if needed.   Principal Diagnosis:  COVID-19  Discharge Diagnoses: Principal Problem:   COVID-19 Active Problems:   Diabetes mellitus with coincident hypertension ([place])   Essential hypertension   COPD (chronic obstructive pulmonary disease) (HCC)   ESRD (end stage renal disease) (HCC)   Paroxysmal atrial fibrillation (HCC)   Discharge Instructions     Increase activity slowly   Complete by: As directed       Allergies as of 02/14/2021       Reactions   3-methyl-2-benzothiazolinone Hydrazone Other (See Comments)   Muscle weakness muscle cramping in legs Other reaction(s): Myalgias (Muscle Pain)   Banana Anaphylaxis, Swelling   TONGUE AND MOUTH SWELLING   Black Walnut Flavor Anaphylaxis   Walnuts   Hazelnut (filbert) Allergy Skin Test Anaphylaxis   Hazelnuts   Leflunomide And Related Other (See Comments)   Severe Headache   Lisinopril Swelling   Angioedema  Swelling of the face   No Healthtouch Food Allergies Anaphylaxis   Spicy Mustard 4.7.2021 Pt reports that she eats Regular Yellow Mustard   Other Other (See Comments), Anaphylaxis, Swelling   Cosentyx= angioedema Other reaction(s): Facial Edema (intolerance) Mouth itches and tongue swelling Hazel nuts and pecans also Walnuts Walnuts Renal failure Other reaction(s): Other Serotonin Syndrome  Serotonin syndrome  Tremors Other reaction(s): Other Tremors Muscle weakness muscle cramping in legs Other reaction(s): Myalgias (Muscle Pain) Mouth itches and tongue swelling Hazel nuts and pecans also Walnuts   Pecan Extract Allergy Skin Test Anaphylaxis   Pecans   Pecan Nut (diagnostic) Anaphylaxis   Pecans  Trazodone And Nefazodone Other (See Comments)   Serotonin Syndrome  Tremors   Adalimumab Other (See Comments)   Blisters on abdomen =humira   Escitalopram Oxalate Other (See Comments)   Hand problems - Serotonin Syndrome    Ezetimibe Other (See Comments)   myalgias cramps   Secukinumab Swelling   Cosentyx = angiodema   Statins Other  (See Comments)   Leg pains and weakness   Ferrlecit [na Ferric Gluc Cplx In Sucrose]    Not reaction given from HD   Ibuprofen Other (See Comments)   Renal Problems        Medication List     TAKE these medications    acetaminophen 500 MG tablet Commonly known as: TYLENOL Take 1,000 mg by mouth every 6 (six) hours as needed for mild pain.   allopurinol 100 MG tablet Commonly known as: ZYLOPRIM Take 100 mg by mouth every evening.   amLODipine 10 MG tablet Commonly known as: NORVASC Take 10 mg by mouth daily.   carboxymethylcellulose 1 % ophthalmic solution Place 1 drop into both eyes as needed (dry eyes).   chlorpheniramine-HYDROcodone 10-8 MG/5ML Suer Commonly known as: TUSSIONEX Take 5 mLs by mouth every 12 (twelve) hours as needed for cough.   cloNIDine 0.1 MG tablet Commonly known as: CATAPRES Take 1 tablet (0.1 mg total) by mouth 2 (two) times daily.   Darbepoetin Alfa 200 MCG/0.4ML Sosy injection Commonly known as: ARANESP Inject 0.4 mLs (200 mcg total) into the vein every Wednesday with hemodialysis.   dexamethasone 6 MG tablet Commonly known as: Decadron Take 1 tablet (6 mg total) by mouth daily for 6 days.   EPINEPHrine 0.3 mg/0.3 mL Soaj injection Commonly known as: EpiPen Inject 0.3 mLs (0.3 mg total) into the muscle once.   folic acid 1 MG tablet Commonly known as: FOLVITE Take 1 tablet (1 mg total) by mouth daily.   gabapentin 300 MG capsule Commonly known as: Neurontin Take 1 capsule (300 mg total) by mouth at bedtime as needed (Neuropathic pain).   heparin 1000 unit/mL Soln injection 3 mLs (3,000 Units total) by Dialysis route as needed (in dialysis as needed).   heparin 1000 unit/mL Soln injection 2 mLs (2,000 Units total) by Dialysis route as needed (in dialysis as needed).   heparin 1000 unit/mL Soln injection 1 mL (1,000 Units total) by Dialysis route as needed (in dialysis).   hydrALAZINE 100 MG tablet Commonly known as:  APRESOLINE Take 1 tablet (100 mg total) by mouth every 8 (eight) hours.   isosorbide mononitrate 30 MG 24 hr tablet Commonly known as: IMDUR Take 1 tablet (30 mg total) by mouth daily.   labetalol 200 MG tablet Commonly known as: NORMODYNE TAKE 1 TABLET BY MOUTH THREE TIMES DAILY   LORazepam 1 MG tablet Commonly known as: ATIVAN Take 1 mg by mouth every 8 (eight) hours as needed for anxiety.   molnupiravir EUA 200 mg Caps capsule Commonly known as: LAGEVRIO Take 4 capsules (800 mg total) by mouth 2 (two) times daily for 5 days.   montelukast 10 MG tablet Commonly known as: SINGULAIR Take 10 mg by mouth daily with supper.   multivitamin Tabs tablet Take 1 tablet by mouth daily with supper.   olmesartan 40 MG tablet Commonly known as: BENICAR Take 40 mg by mouth every evening.   Oxycodone HCl 10 MG Tabs Take 10 mg by mouth 2 (two) times daily as needed for pain.   pantoprazole 40 MG tablet Commonly known as: PROTONIX Take  40 mg by mouth daily.   pentafluoroprop-tetrafluoroeth Aero Commonly known as: GEBAUERS Apply 1 application topically as needed (topical anesthesia for hemodialysis).   predniSONE 5 MG tablet Commonly known as: DELTASONE Take 1 tablet (5 mg total) by mouth daily with breakfast. Start taking on: February 21, 2021 What changed: These instructions start on February 21, 2021. If you are unsure what to do until then, ask your doctor or other care provider.   PROBIOTIC PO Take 1 capsule by mouth daily.   SPIRIVA RESPIMAT IN Inhale 2 puffs into the lungs daily.   Ventolin HFA 108 (90 Base) MCG/ACT inhaler Generic drug: albuterol Inhale 1-2 puffs into the lungs every 6 (six) hours as needed for shortness of breath. wheezing   Vitamin D3 25 MCG tablet Commonly known as: Vitamin D Take 1 tablet (1,000 Units total) by mouth every evening.   zolpidem 5 MG tablet Commonly known as: AMBIEN Take 5 mg by mouth at bedtime as needed for sleep.         Allergies  Allergen Reactions   3-Methyl-2-Benzothiazolinone Hydrazone Other (See Comments)    Muscle weakness muscle cramping in legs Other reaction(s): Myalgias (Muscle Pain)   Banana Anaphylaxis and Swelling    TONGUE AND MOUTH SWELLING   Black Walnut Flavor Anaphylaxis    Walnuts   Hazelnut (Filbert) Allergy Skin Test Anaphylaxis    Hazelnuts   Leflunomide And Related Other (See Comments)    Severe Headache   Lisinopril Swelling    Angioedema  Swelling of the face   No Healthtouch Food Allergies Anaphylaxis    Spicy Mustard 4.7.2021 Pt reports that she eats Regular Yellow Mustard   Other Other (See Comments), Anaphylaxis and Swelling    Cosentyx= angioedema  Other reaction(s): Facial Edema (intolerance) Mouth itches and tongue swelling Hazel nuts and pecans also Walnuts Walnuts Renal failure Other reaction(s): Other Serotonin Syndrome  Serotonin syndrome  Tremors Other reaction(s): Other Tremors Muscle weakness muscle cramping in legs Other reaction(s): Myalgias (Muscle Pain) Mouth itches and tongue swelling Hazel nuts and pecans also Walnuts    Pecan Extract Allergy Skin Test Anaphylaxis    Pecans    Pecan Nut (Diagnostic) Anaphylaxis    Pecans   Trazodone And Nefazodone Other (See Comments)    Serotonin Syndrome  Tremors   Adalimumab Other (See Comments)    Blisters on abdomen =humira   Escitalopram Oxalate Other (See Comments)    Hand problems - Serotonin Syndrome     Ezetimibe Other (See Comments)    myalgias cramps    Secukinumab Swelling    Cosentyx = angiodema    Statins Other (See Comments)    Leg pains and weakness   Ferrlecit [Na Ferric Gluc Cplx In Sucrose]     Not reaction given from HD   Ibuprofen Other (See Comments)    Renal Problems    Consultations: Nephrology  Discharge Exam: BP (!) 182/83 (BP Location: Right Wrist)    Pulse 80    Temp 97.8 F (36.6 C) (Temporal)    Resp (!) 28    Ht 5\' 2"  (1.575 m)    Wt 67.4 kg     SpO2 100%    BMI 27.18 kg/m  Physical Exam Constitutional:      Appearance: Normal appearance.     Comments: Fatigued appearing but no distress  HENT:     Head: Normocephalic and atraumatic.     Mouth/Throat:     Mouth: Mucous membranes are moist.  Eyes:  Extraocular Movements: Extraocular movements intact.  Cardiovascular:     Rate and Rhythm: Normal rate and regular rhythm.  Pulmonary:     Comments: Coarse breath sounds in anterior lung fields Abdominal:     General: Bowel sounds are normal. There is no distension.     Palpations: Abdomen is soft.     Tenderness: There is no abdominal tenderness.  Musculoskeletal:        General: Normal range of motion.     Cervical back: Normal range of motion and neck supple.  Skin:    General: Skin is warm and dry.  Neurological:     General: No focal deficit present.     Mental Status: She is alert.  Psychiatric:        Mood and Affect: Mood normal.        Behavior: Behavior normal.     The results of significant diagnostics from this hospitalization (including imaging, microbiology, ancillary and laboratory) are listed below for reference.   Microbiology: Recent Results (from the past 240 hour(s))  Resp Panel by RT-PCR (Flu A&B, Covid) Nasopharyngeal Swab     Status: Abnormal   Collection Time: 02/13/21 10:24 AM   Specimen: Nasopharyngeal Swab; Nasopharyngeal(NP) swabs in vial transport medium  Result Value Ref Range Status   SARS Coronavirus 2 by RT PCR POSITIVE (A) NEGATIVE Final    Comment: (NOTE) SARS-CoV-2 target nucleic acids are DETECTED.  The SARS-CoV-2 RNA is generally detectable in upper respiratory specimens during the acute phase of infection. Positive results are indicative of the presence of the identified virus, but do not rule out bacterial infection or co-infection with other pathogens not detected by the test. Clinical correlation with patient history and other diagnostic information is necessary to  determine patient infection status. The expected result is Negative.  Fact Sheet for Patients: EntrepreneurPulse.com.au  Fact Sheet for Healthcare Providers: IncredibleEmployment.be  This test is not yet approved or cleared by the [place] FDA and  has been authorized for detection and/or diagnosis of SARS-CoV-2 by FDA under an Emergency Use Authorization (EUA).  This EUA will remain in effect (meaning this test can be used) for the duration of  the COVID-19 declaration under Section 564(b)(1) of the A ct, 21 U.S.C. section 360bbb-3(b)(1), unless the authorization is terminated or revoked sooner.     Influenza A by PCR NEGATIVE NEGATIVE Final   Influenza B by PCR NEGATIVE NEGATIVE Final    Comment: (NOTE) The Xpert Xpress SARS-CoV-2/FLU/RSV plus assay is intended as an aid in the diagnosis of influenza from Nasopharyngeal swab specimens and should not be used as a sole basis for treatment. Nasal washings and aspirates are unacceptable for Xpert Xpress SARS-CoV-2/FLU/RSV testing.  Fact Sheet for Patients: EntrepreneurPulse.com.au  Fact Sheet for Healthcare Providers: IncredibleEmployment.be  This test is not yet approved or cleared by the [place] FDA and has been authorized for detection and/or diagnosis of SARS-CoV-2 by FDA under an Emergency Use Authorization (EUA). This EUA will remain in effect (meaning this test can be used) for the duration of the COVID-19 declaration under Section 564(b)(1) of the Act, 21 U.S.C. section 360bbb-3(b)(1), unless the authorization is terminated or revoked.  Performed at [facility], [address] [place], [place] [address]      Labs: BNP (last 3 results) Recent Labs    01/02/21 1006  BNP >7,322.0*   Basic Metabolic Panel: Recent Labs  Lab 02/13/21 1147 02/14/21 0659 02/14/21 1007  NA 133* 133* 133*  K 5.6* 5.6* 5.7*  CL 96* 98  98  CO2 27 25 25   GLUCOSE 90 46* 38*  BUN 33* 41* 43*  CREATININE 6.66* 8.00* 7.93*  CALCIUM 8.1* 8.7* 8.6*  PHOS  --   --  6.6*   Liver Function Tests: Recent Labs  Lab 02/13/21 1147 02/14/21 0659 02/14/21 1007  AST 23 17  --   ALT 15 12  --   ALKPHOS 186* 190*  --   BILITOT 0.4 0.5  --   PROT 6.6 5.5*  --   ALBUMIN 3.5 2.8* 2.9*   No results for input(s): LIPASE, AMYLASE in the last 168 hours. No results for input(s): AMMONIA in the last 168 hours. CBC: Recent Labs  Lab 02/13/21 1147 02/14/21 0321 02/14/21 1007  WBC 3.3* 4.7 4.0  NEUTROABS 2.4 3.8  --   HGB 9.5* 9.4* 9.4*  HCT 30.2* 30.8* 30.5*  MCV 103.1* 104.1* 106.3*  PLT 116* 100* 111*   Cardiac Enzymes: No results for input(s): CKTOTAL, CKMB, CKMBINDEX, TROPONINI in the last 168 hours. BNP: Invalid input(s): POCBNP CBG: Recent Labs  Lab 02/13/21 1727  GLUCAP 92   D-Dimer Recent Labs    02/14/21 0321  DDIMER 0.55*   Hgb A1c No results for input(s): HGBA1C in the last 72 hours. Lipid Profile No results for input(s): CHOL, HDL, LDLCALC, TRIG, CHOLHDL, LDLDIRECT in the last 72 hours. Thyroid function studies No results for input(s): TSH, T4TOTAL, T3FREE, THYROIDAB in the last 72 hours.  Invalid input(s): FREET3 Anemia work up No results for input(s): VITAMINB12, FOLATE, FERRITIN, TIBC, IRON, RETICCTPCT in the last 72 hours. Urinalysis    Component Value Date/Time   COLORURINE YELLOW 09/12/2020 2333   APPEARANCEUR HAZY (A) 09/12/2020 2333   LABSPEC 1.012 09/12/2020 2333   PHURINE 8.0 09/12/2020 2333   GLUCOSEU NEGATIVE 09/12/2020 2333   HGBUR SMALL (A) 09/12/2020 2333   BILIRUBINUR NEGATIVE 09/12/2020 2333   KETONESUR NEGATIVE 09/12/2020 2333   PROTEINUR 100 (A) 09/12/2020 2333   UROBILINOGEN 0.2 09/05/2014 0013   NITRITE NEGATIVE 09/12/2020 2333   LEUKOCYTESUR NEGATIVE 09/12/2020 2333   Sepsis Labs Invalid input(s): PROCALCITONIN,  WBC,  LACTICIDVEN Microbiology Recent Results (from  the past 240 hour(s))  Resp Panel by RT-PCR (Flu A&B, Covid) Nasopharyngeal Swab     Status: Abnormal   Collection Time: 02/13/21 10:24 AM   Specimen: Nasopharyngeal Swab; Nasopharyngeal(NP) swabs in vial transport medium  Result Value Ref Range Status   SARS Coronavirus 2 by RT PCR POSITIVE (A) NEGATIVE Final    Comment: (NOTE) SARS-CoV-2 target nucleic acids are DETECTED.  The SARS-CoV-2 RNA is generally detectable in upper respiratory specimens during the acute phase of infection. Positive results are indicative of the presence of the identified virus, but do not rule out bacterial infection or co-infection with other pathogens not detected by the test. Clinical correlation with patient history and other diagnostic information is necessary to determine patient infection status. The expected result is Negative.  Fact Sheet for Patients: EntrepreneurPulse.com.au  Fact Sheet for Healthcare Providers: IncredibleEmployment.be  This test is not yet approved or cleared by the [place] FDA and  has been authorized for detection and/or diagnosis of SARS-CoV-2 by FDA under an Emergency Use Authorization (EUA).  This EUA will remain in effect (meaning this test can be used) for the duration of  the COVID-19 declaration under Section 564(b)(1) of the A ct, 21 U.S.C. section 360bbb-3(b)(1), unless the authorization is terminated or revoked sooner.     Influenza  A by PCR NEGATIVE NEGATIVE Final   Influenza B by PCR NEGATIVE NEGATIVE Final    Comment: (NOTE) The Xpert Xpress SARS-CoV-2/FLU/RSV plus assay is intended as an aid in the diagnosis of influenza from Nasopharyngeal swab specimens and should not be used as a sole basis for treatment. Nasal washings and aspirates are unacceptable for Xpert Xpress SARS-CoV-2/FLU/RSV testing.  Fact Sheet for Patients: EntrepreneurPulse.com.au  Fact Sheet for Healthcare  Providers: IncredibleEmployment.be  This test is not yet approved or cleared by the [place] FDA and has been authorized for detection and/or diagnosis of SARS-CoV-2 by FDA under an Emergency Use Authorization (EUA). This EUA will remain in effect (meaning this test can be used) for the duration of the COVID-19 declaration under Section 564(b)(1) of the Act, 21 U.S.C. section 360bbb-3(b)(1), unless the authorization is terminated or revoked.  Performed at [facility], [address] [place], [place] [address]     Procedures/Studies: [facility] Chest Port 1 View  Result Date: 02/13/2021 CLINICAL DATA:  SOB.  Cough.  COVID positive. EXAM: PORTABLE CHEST 1 VIEW COMPARISON:  01/02/21 FINDINGS: Previous median sternotomy and CABG procedure stable mild cardiac enlargement. Diffuse pulmonary vascular congestion is identified. Blunting of the right costophrenic angle is suspicious for small right pleural effusion. No airspace consolidation identified. IMPRESSION: 1. Suspect mild CHF. 2. No signs of pneumonia. Electronically Signed   By: Kerby Moors M.D.   On: 02/13/2021 11:47     Time coordinating discharge: Over 30 minutes    Dwyane Dee, MD  [facility] 02/14/2021, 1:51 PM

## 2021-02-14 NOTE — Progress Notes (Signed)
°  Transition of Care [facility]) Screening Note   Patient Details  Name: Jodi Bell Date of Birth: [birth year]   [facility]) CM/SW Contact:    Tom-Johnson, Renea Ee, RN Phone Number: 02/14/2021, 11:08 AM  Patient is from home and admitted for COPD and Covid+. Patient had missed several dialysis treatment outpatient. Currently in dialysis inpatient. Transition of Care Department [facility]: [facility] Rhonda W) has reviewed patient and no TOC needs have been identified at this time. We will continue to monitor patient advancement through interdisciplinary progression rounds. If new patient transition needs arise, please place a TOC consult.

## 2021-02-15 LAB — HEPATITIS B SURFACE ANTIBODY, QUANTITATIVE: Hep B S AB Quant (Post): 17 m[IU]/mL (ref >9.9)

## 2021-02-19 ENCOUNTER — Encounter: Payer: Self-pay | Admitting: Cardiology

## 2021-02-20 MED ORDER — ISOSORBIDE MONONITRATE ER 30 MG PO TB24: 30.0000 mg | ORAL_TABLET | Freq: Every day | ORAL | 3 refills | Status: DC

## 2021-02-21 ENCOUNTER — Other Ambulatory Visit: Payer: Self-pay | Admitting: Surgery

## 2021-02-23 ENCOUNTER — Other Ambulatory Visit: Payer: Self-pay | Admitting: Surgical

## 2021-03-04 ENCOUNTER — Other Ambulatory Visit: Payer: Self-pay | Admitting: Surgical

## 2021-03-19 ENCOUNTER — Other Ambulatory Visit: Payer: Self-pay

## 2021-03-19 ENCOUNTER — Encounter (HOSPITAL_BASED_OUTPATIENT_CLINIC_OR_DEPARTMENT_OTHER): Payer: Self-pay | Admitting: *Deleted

## 2021-03-19 ENCOUNTER — Other Ambulatory Visit (HOSPITAL_BASED_OUTPATIENT_CLINIC_OR_DEPARTMENT_OTHER): Payer: Self-pay

## 2021-03-19 ENCOUNTER — Emergency Department (HOSPITAL_BASED_OUTPATIENT_CLINIC_OR_DEPARTMENT_OTHER): Payer: Medicare PPO

## 2021-03-19 ENCOUNTER — Emergency Department (HOSPITAL_BASED_OUTPATIENT_CLINIC_OR_DEPARTMENT_OTHER)
Admission: EM | Admit: 2021-03-19 | Discharge: 2021-03-19 | Disposition: A | Discharge status: Home / Self Care | Payer: Medicare PPO | Attending: Emergency Medicine | Admitting: Emergency Medicine

## 2021-03-19 DIAGNOSIS — M545 Low back pain, unspecified: Secondary | ICD-10-CM | POA: Diagnosis present

## 2021-03-19 DIAGNOSIS — M5431 Sciatica, right side: Secondary | ICD-10-CM

## 2021-03-19 DIAGNOSIS — M5441 Lumbago with sciatica, right side: Secondary | ICD-10-CM | POA: Insufficient documentation

## 2021-03-19 MED ORDER — POLYETHYLENE GLYCOL 3350 17 GM/SCOOP PO POWD
17.0000 g | Freq: Every day | ORAL | 0 refills | Status: DC
  Filled 2021-03-19: qty 510, 30d supply, fill #0

## 2021-03-19 MED ORDER — HYDROCODONE-ACETAMINOPHEN 5-325 MG PO TABS
1.0000 | ORAL_TABLET | Freq: Once | ORAL | Status: AC
Start: 2021-03-19 — End: 2021-03-19
  Administered 2021-03-19: 1 via ORAL
  Filled 2021-03-19: qty 1

## 2021-03-19 MED ORDER — FENTANYL CITRATE PF 50 MCG/ML IJ SOSY
50.0000 ug | PREFILLED_SYRINGE | Freq: Once | INTRAMUSCULAR | Status: AC
  Administered 2021-03-19: 50 ug via INTRAMUSCULAR
  Filled 2021-03-19: qty 1

## 2021-03-19 NOTE — ED Notes (Signed)
Pt in sitting up in bed, son at bedside with pt's home O2, pt satting well on 2L via [place], pt verbalized understanding d/c instructions and follow up. Pt from dpt via wc with family

## 2021-03-19 NOTE — ED Notes (Signed)
Pt in bed, pt states that she is feeling much better, reports that she was sleeping until I came to re assess her pain.

## 2021-03-19 NOTE — Discharge Instructions (Addendum)
You hip xray did not show any fracture or dislocation of your hip joints.    Continue to take your Oxycodone as previously prescribed.  Recommend that you take Miralax daily to avoid constipation.  Follow up with your Orthopedic doctor as scheduled.  You can call them to see if you can be seen earlier or if they have any cancellations.

## 2021-03-19 NOTE — ED Notes (Signed)
Holding for DC to home awaiting for son to arrive with clients home oxygen device. Pt needs to wait in room with oxygen via [place] at 3lpm

## 2021-03-19 NOTE — ED Triage Notes (Addendum)
Back, right hip and right leg pain. Hx of chronic back pain. She has Percocet but did not take any today. She has an appointment at the spine center the end of the month. PO noted. Pt states she usually runs 82% r/a. She used home oxygen as needed. No c.o sob.

## 2021-03-19 NOTE — ED Provider Notes (Signed)
[facility] EMERGENCY DEPARTMENT Provider Note   CSN: [id number] Arrival date & time: 03/19/21  1215     History Chief Complaint  Patient presents with   Back Pain    Jodi Bell is a 61 y.o. female.  Patient reports right side back pain that radiates down right leg.  Was at Dialysis yesterday when pain increased.  Reports history of chronic low back pain s/p spinal surgery in 2016.  Reports taking Oxycodone 10 mg at home for pain but has not helped. Pain increased today and coming in waves.  Reports having seen Ortho for similar symptoms and hip xrays showed osteoarthritis.  She is concerned that there may be something wrong given increase in pain.  Denies any fevers, trauma, incontinence of bowel or bladder.    Home Medications Prior to Admission medications   Medication Sig Start Date End Date Taking? Authorizing Provider  acetaminophen (TYLENOL) 500 MG tablet Take 1,000 mg by mouth every 6 (six) hours as needed for mild pain.    [provider]  allopurinol (ZYLOPRIM) 100 MG tablet Take 100 mg by mouth every evening.    [provider]  amLODipine (NORVASC) 10 MG tablet Take 10 mg by mouth daily.    [provider]  carboxymethylcellulose 1 % ophthalmic solution Place 1 drop into both eyes as needed (dry eyes).    [provider]  chlorpheniramine-HYDROcodone (TUSSIONEX) 10-8 MG/5ML SUER Take 5 mLs by mouth every 12 (twelve) hours as needed for cough. 02/14/21   Dwyane Dee, MD  cholecalciferol (VITAMIN D) 25 MCG tablet Take 1 tablet (1,000 Units total) by mouth every evening. 10/04/20   Girtha Rm, Wilder Glade, PA-C  cloNIDine (CATAPRES) 0.1 MG tablet Take 1 tablet (0.1 mg total) by mouth 2 (two) times daily. 10/04/20   [name], PA-C  Darbepoetin Alfa (ARANESP) 200 MCG/0.4ML SOSY injection Inject 0.4 mLs (200 mcg total) into the vein every Wednesday with hemodialysis. 10/10/20   Gold, Wilder Glade, PA-C  EPINEPHrine (EPIPEN) 0.3 mg/0.3 mL SOAJ  injection Inject 0.3 mLs (0.3 mg total) into the muscle once. 09/30/12   Le, Thao P, DO  folic acid (FOLVITE) 1 MG tablet Take 1 tablet (1 mg total) by mouth daily. 10/04/20   Jadene Pierini E, PA-C  gabapentin (NEURONTIN) 300 MG capsule Take 1 capsule (300 mg total) by mouth at bedtime as needed (Neuropathic pain). 12/14/17 01/10/21  Dana Allan I, MD  heparin 1000 unit/mL SOLN injection 3 mLs (3,000 Units total) by Dialysis route as needed (in dialysis as needed). 10/04/20   Jadene Pierini E, PA-C  heparin 1000 unit/mL SOLN injection 2 mLs (2,000 Units total) by Dialysis route as needed (in dialysis as needed). 10/04/20   Jadene Pierini E, PA-C  heparin 1000 unit/mL SOLN injection 1 mL (1,000 Units total) by Dialysis route as needed (in dialysis). 10/04/20   Jadene Pierini E, PA-C  hydrALAZINE (APRESOLINE) 100 MG tablet Take 1 tablet (100 mg total) by mouth every 8 (eight) hours. 07/06/20   [name], MD  isosorbide mononitrate (IMDUR) 30 MG 24 hr tablet Take 1 tablet (30 mg total) by mouth daily. 02/20/21   Jerline Pain, MD  labetalol (NORMODYNE) 200 MG tablet TAKE 1 TABLET BY MOUTH THREE TIMES DAILY 12/10/20   Gold, Wayne E, PA-C  LORazepam (ATIVAN) 1 MG tablet Take 1 mg by mouth every 8 (eight) hours as needed for anxiety. 06/25/20   [provider]  montelukast (SINGULAIR) 10 MG tablet Take 10  mg by mouth daily with supper.     [provider]  multivitamin (RENA-VIT) TABS tablet Take 1 tablet by mouth daily with supper.    [provider]  olmesartan (BENICAR) 40 MG tablet Take 40 mg by mouth every evening. 05/06/19   [provider]  Oxycodone HCl 10 MG TABS Take 10 mg by mouth 2 (two) times daily as needed for pain. 01/11/21   [provider]  pantoprazole (PROTONIX) 40 MG tablet Take 40 mg by mouth daily. 06/09/20   [provider]  pentafluoroprop-tetrafluoroeth Landry Dyke) AERO Apply 1 application topically as needed (topical anesthesia for  hemodialysis). 10/04/20   [name], PA-C  predniSONE (DELTASONE) 5 MG tablet Take 1 tablet (5 mg total) by mouth daily with breakfast. 02/21/21   Dwyane Dee, MD  Probiotic Product (PROBIOTIC PO) Take 1 capsule by mouth daily.    [provider]  Tiotropium Bromide Monohydrate (SPIRIVA RESPIMAT IN) Inhale 2 puffs into the lungs daily.    [provider]  VENTOLIN HFA 108 (90 Base) MCG/ACT inhaler Inhale 1-2 puffs into the lungs every 6 (six) hours as needed for shortness of breath. wheezing 06/13/15   [provider]  zolpidem (AMBIEN) 5 MG tablet Take 5 mg by mouth at bedtime as needed for sleep. 02/04/21   [provider]      Allergies    3-methyl-2-benzothiazolinone hydrazone, Banana, Black walnut flavor, Hazelnut (filbert) allergy skin test, Leflunomide and related, Lisinopril, No healthtouch food allergies, Other, Pecan extract allergy skin test, Pecan nut (diagnostic), Trazodone and nefazodone, Adalimumab, Escitalopram oxalate, Ezetimibe, Secukinumab, Statins, Ferrlecit [na ferric gluc cplx in sucrose], and Ibuprofen    Review of Systems   Review of Systems  Constitutional:  Negative for appetite change and fever.  Gastrointestinal:  Negative for diarrhea, nausea and vomiting.  Musculoskeletal:  Positive for back pain.  Neurological:  Positive for weakness and numbness.   Physical Exam Updated Vital Signs BP (!) 176/106 (BP Location: Right Arm)    Pulse 76    Temp 98.5 F (36.9 C) (Oral)    Resp 18    Ht 5\' 2"  (1.575 m)    Wt 67.4 kg    SpO2 95%    BMI 27.18 kg/m  Physical Exam HENT:     Head: Normocephalic.  Eyes:     Pupils: Pupils are equal, round, and reactive to light.  Cardiovascular:     Rate and Rhythm: Normal rate and regular rhythm.     Pulses: Normal pulses.     Heart sounds: Normal heart sounds. No murmur heard. Musculoskeletal:        General: Tenderness present. No swelling or deformity.     Lumbar back: Spasms and  tenderness present. No bony tenderness. Positive right straight leg raise test.     Right hip: Tenderness present. No deformity. Decreased range of motion.     Right upper leg: Tenderness present.     Right lower leg: No edema.     Left lower leg: No edema.  Skin:    General: Skin is warm.     Capillary Refill: Capillary refill takes less than 2 seconds.  Neurological:     General: No focal deficit present.     Mental Status: She is alert.    ED Results / Procedures / Treatments   Labs (all labs ordered are listed, but only abnormal results are displayed) Labs Reviewed - No data to display  EKG None  Radiology No results  found.   Medications Ordered in ED Medications  fentaNYL (SUBLIMAZE) injection 50 mcg (50 mcg Intramuscular Given 03/19/21 1351)  HYDROcodone-acetaminophen (NORCO/VICODIN) 5-325 MG per tablet 1 tablet (1 tablet Oral Given 03/19/21 1535)    ED Course/ Medical Decision Making/ A&P Clinical Course as of 03/19/21 1558  Tue Mar 19, 2021  1554 DG Pelvis Portable [TW]  1555 DG Pelvis Portable [TW]    Clinical Course User Index [TW] Carollee Leitz, MD  Jodi Bell is a 61 y.o. female who presents to the ED with lower back pain radiating down left leg. She is hypertensive on exam, likely secondary to pain.  Low suspicion for spinal compression given no red flags.  Exam limited secondary to pain.  No lower lumbar vertebral point tenderness. Positive right side FABERs, SLR testing.   Pelvic xray negative for fracture or dislocation.  Suspect flare of sciatic nerve pain causing increase in pain.  Patient received Fentanyl 50 mcg IV x 1 and Norco x 1 for pain with some mild relief.  Will discharge home with her home pain management and Miralax to avoid constipation.   Follow up with Orthopedics as previously scheduled.  Strict return precautions provided.                           Medical Decision Making Risk Prescription drug management.   Final Clinical  Impression(s) / ED Diagnoses Final diagnoses:  Sciatica of right side    Rx / DC Orders ED Discharge Orders     None         Carollee Leitz, MD 16/57/90 3833    Campbell Stall P, DO 38/32/91 1304

## 2021-03-19 NOTE — ED Notes (Signed)
Pt in bed, pt c/o back pain, pt states that she has nausea with her pain med and sometimes she takes nausea medicine. Pt states that her back pain is worse today, pt reports that she uses O2 at home and since she had covid she gets more sob, pt 77% on room air, pt placed on 3L via [place] and is satting 97%, pt talking in full sentences, pt remains on O2 sat monitor.

## 2021-03-24 ENCOUNTER — Other Ambulatory Visit: Payer: Self-pay | Admitting: Surgical

## 2021-03-26 ENCOUNTER — Other Ambulatory Visit (HOSPITAL_BASED_OUTPATIENT_CLINIC_OR_DEPARTMENT_OTHER): Payer: Self-pay

## 2021-03-26 ENCOUNTER — Ambulatory Visit (INDEPENDENT_AMBULATORY_CARE_PROVIDER_SITE_OTHER): Payer: Medicare PPO | Admitting: Pulmonary Disease

## 2021-03-26 ENCOUNTER — Other Ambulatory Visit: Payer: Self-pay

## 2021-03-26 ENCOUNTER — Encounter: Payer: Self-pay | Admitting: Pulmonary Disease

## 2021-03-26 VITALS — BP 130/74 | HR 72 | Temp 98.8°F | Ht 62.0 in | Wt 138.0 lb

## 2021-03-26 DIAGNOSIS — J9611 Chronic respiratory failure with hypoxia: Secondary | ICD-10-CM

## 2021-03-26 DIAGNOSIS — G4733 Obstructive sleep apnea (adult) (pediatric): Secondary | ICD-10-CM | POA: Diagnosis not present

## 2021-03-26 DIAGNOSIS — R0609 Other forms of dyspnea: Secondary | ICD-10-CM

## 2021-03-26 NOTE — Patient Instructions (Signed)
Nice to meet you  We will walk today and tell you how much oxygen you should be using.  If you qualify, we will send a new order for the portable oxygen concentrator although I am not overly optimistic this will be approved.  It is worth trying.  I will see you back in a few months, we will do pulmonary function tests ideally the same day you return.  These test can be the most information about your breathing to help me further evaluate your symptoms.  I am waiting a bit after your COVID infection to make sure we get the most accurate information.  I think the biggest issue is fluid buildup between dialysis sessions that causes her oxygen to drop.  Your previous breathing test have been reassuring and your previous CT scan primarily demonstrated a bit of extra fluid on the lungs, nothing else to worry about.  Return to clinic in 4 months with PFTs same day, sooner as needed.

## 2021-03-26 NOTE — Progress Notes (Signed)
@Patient  ID: Jodi Bell, female    DOB: 1961-01-10, 61 y.o.   MRN: [id number]  Chief Complaint  Patient presents with   Consult    Consult for SOB even when she is on oxygen. P tstates that she wears her oxygen when she feels short of breath. She is on 2L. Pt uses [employer].     Referring provider: Karleen Bell., MD  HPI:   61 y.o. woman whom we are seeing in consultation for evaluation of dyspnea on exertion, chronic hypoxemic respiratory failure.  Note from PCP reviewed.  She has chronic dyspnea.  Using oxygen for couple months now.  Checks her oxygen saturation usually stays okay while at rest, occasionally drops while at rest.  Usually drops when moving.  Some days are better than others.  Despite using 2 L occasionally drops into the 80s with exertion per her report.  She was walk today 3 laps around the office and did not desaturate below 95%.  Reviewed at length her prior spirometry and chest imaging.  Spirometry does not show COPD.  Chest imaging repeatedly shows evidence of volume overload and pulmonary edema.  Discussed this is most likely the cause for her hypoxemia.  Suspect it waxes and wanes based on her volume status as it relates to the timing of her dialysis.  She was counseled the same.  Reviewed most recent CT chest 12-2020 that shows evidence of volume overload with interlobular septal thickening and small bilateral pleural effusions without evidence of ILD or other parenchymal abnormality.  Most recent chest x-ray 02/2021 reviewed which shows peribronchovascular opacities most consistent with pulmonary edema on my interpretation.  Reviewed TTE most recent 12/2020 that shows severe left atrial dilation, grade 2 diastolic dysfunction, elevated left atrial pressure, aortic stenosis, mitral valve regurgitation and mitral valve stenosis as well as enlarged RV with reduced function.  Counseled most likely reason for her dyspnea is volume overloaded as a relates to her chronic heart  failure worsened as she cannot excrete her own urine with relied on dialysis for volume removal.  PMH: ESRD on HD, chronic diastolic heart failure, mitral and aortic valve abnormalities, hypertension Surgical history: Knee replacement, CABG Family history: Mother with history of lung cancer Social history: Former smoker, 20-pack-year history, quit 2022, lives in [place] / Pulmonary Flowsheets:   ACT:  No flowsheet data found.  MMRC: No flowsheet data found.  Epworth:  No flowsheet data found.  Tests:   FENO:  No results found for: NITRICOXIDE  PFT: PFT Results Latest Ref Rng & Units 09/11/2020  FVC-Pre L 1.28  FVC-Predicted Pre % 52  Pre FEV1/FVC % % 87  FEV1-Pre L 1.11  FEV1-Predicted Pre % 57  Prior spirometry reviewed interpreted as no fixed obstruction, suggestive of moderate restriction versus gas trapping.  WALK:  No flowsheet data found.  Imaging: DG Pelvis Portable  Result Date: 03/19/2021 CLINICAL DATA:  Right hip and leg pain. EXAM: PORTABLE PELVIS 1-2 VIEWS COMPARISON:  CT abdomen pelvis dated October 01, 2020. FINDINGS: No acute fracture or dislocation. The hip joint spaces are preserved. Mild degenerative changes of the pubic symphysis and sacroiliac joints. Prior L3-L4 fusion. Soft tissues are unremarkable. IMPRESSION: 1. No acute osseous abnormality or significant hip degenerative changes. Electronically Signed   By: Titus Dubin M.D.   On: 03/19/2021 16:01    Lab Results:  CBC    Component Value Date/Time   WBC 4.0 02/14/2021 1007   RBC 2.87 (L) 02/14/2021 1007  HGB 9.4 (L) 02/14/2021 1007   HCT 30.5 (L) 02/14/2021 1007   PLT 111 (L) 02/14/2021 1007   MCV 106.3 (H) 02/14/2021 1007   MCV 94.5 10/12/2012 1211   MCH 32.8 02/14/2021 1007   MCHC 30.8 02/14/2021 1007   RDW 16.6 (H) 02/14/2021 1007   LYMPHSABS 0.4 (L) 02/14/2021 0321   MONOABS 0.5 02/14/2021 0321   EOSABS 0.1 02/14/2021 0321   BASOSABS 0.0 02/14/2021 0321     BMET    Component Value Date/Time   NA 133 (L) 02/14/2021 1007   K 5.7 (H) 02/14/2021 1007   CL 98 02/14/2021 1007   CO2 25 02/14/2021 1007   GLUCOSE 38 (LL) 02/14/2021 1007   BUN 43 (H) 02/14/2021 1007   CREATININE 7.93 (H) 02/14/2021 1007   CREATININE 1.34 (H) 09/18/2011 1400   CALCIUM 8.6 (L) 02/14/2021 1007   GFRNONAA 5 (L) 02/14/2021 1007   GFRAA 16 (L) 11/09/2019 1152    BNP    Component Value Date/Time   BNP >4,500.0 (H) 01/02/2021 1006    ProBNP No results found for: PROBNP  Specialty Problems       Pulmonary Problems   Acute respiratory failure with hypoxia (HCC)   COPD (chronic obstructive pulmonary disease) (HCC)   Asthma   OSA (obstructive sleep apnea)    Allergies  Allergen Reactions   3-Methyl-2-Benzothiazolinone Hydrazone Other (See Comments)    Muscle weakness muscle cramping in legs Other reaction(s): Myalgias (Muscle Pain)   Banana Anaphylaxis and Swelling    TONGUE AND MOUTH SWELLING   Black Walnut Flavor Anaphylaxis    Walnuts   Hazelnut (Filbert) Allergy Skin Test Anaphylaxis    Hazelnuts   Leflunomide And Related Other (See Comments)    Severe Headache   Lisinopril Swelling    Angioedema  Swelling of the face   No Healthtouch Food Allergies Anaphylaxis    Spicy Mustard 4.7.2021 Pt reports that she eats Regular Yellow Mustard   Other Other (See Comments), Anaphylaxis and Swelling    Cosentyx= angioedema  Other reaction(s): Facial Edema (intolerance) Mouth itches and tongue swelling Hazel nuts and pecans also Walnuts Walnuts Renal failure Other reaction(s): Other Serotonin Syndrome  Serotonin syndrome  Tremors Other reaction(s): Other Tremors Muscle weakness muscle cramping in legs Other reaction(s): Myalgias (Muscle Pain) Mouth itches and tongue swelling Hazel nuts and pecans also Walnuts    Pecan Extract Allergy Skin Test Anaphylaxis    Pecans    Pecan Nut (Diagnostic) Anaphylaxis    Pecans   Trazodone And  Nefazodone Other (See Comments)    Serotonin Syndrome  Tremors   Adalimumab Other (See Comments)    Blisters on abdomen =humira   Escitalopram Oxalate Other (See Comments)    Hand problems - Serotonin Syndrome     Ezetimibe Other (See Comments)    myalgias cramps    Secukinumab Swelling    Cosentyx = angiodema    Statins Other (See Comments)    Leg pains and weakness   Ferrlecit [Na Ferric Gluc Cplx In Sucrose]     Not reaction given from HD   Ibuprofen Other (See Comments)    Renal Problems    Immunization History  Administered Date(s) Administered   Influenza,inj,Quad PF,6+ Mos 01/02/2014, 11/13/2017   Moderna Sars-Covid-2 Vaccination 04/30/2019, 06/01/2019   Pneumococcal Polysaccharide-23 02/10/2005    Past Medical History:  Diagnosis Date   Anemia of chronic disease    Anxiety    Arthritis    oa and psoriatic ra   Asthma  Back pain, chronic    lower back   Candida infection finished tx 2 days ago   Chronic insomnia    COPD (chronic obstructive pulmonary disease) (HCC)    Coronary artery calcification seen on CT scan    Diabetes mellitus    type 2   Eczema    ESRD on dialysis [facility])    Essential hypertension    GERD (gastroesophageal reflux disease)    Gout    History of hiatal hernia    Hypoalbuminemia    Hyponatremia    Mild aortic stenosis    Mitral regurgitation    Mitral stenosis    Obesity    PAD (peripheral artery disease) ([place])    a. externial iliac calcification seen on CT 04/2020.   Pneumonia few yrs ago x 2   Sleep apnea    Tobacco abuse    Upper GI bleed 05/2019    Tobacco History: Social History   Tobacco Use  Smoking Status Former   Packs/day: 0.50   Years: 40.00   Pack years: 20.00   Types: Cigarettes   Quit date: 08/2020   Years since quitting: 0.6  Smokeless Tobacco Never   Counseling given: Not Answered   Continue to not smoke  Outpatient Encounter Medications as of 03/26/2021  Medication Sig   acetaminophen  (TYLENOL) 500 MG tablet Take 1,000 mg by mouth every 6 (six) hours as needed for mild pain.   allopurinol (ZYLOPRIM) 100 MG tablet Take 100 mg by mouth every evening.   amLODipine (NORVASC) 10 MG tablet Take 10 mg by mouth daily.   carboxymethylcellulose 1 % ophthalmic solution Place 1 drop into both eyes as needed (dry eyes).   cholecalciferol (VITAMIN D) 25 MCG tablet Take 1 tablet (1,000 Units total) by mouth every evening.   cloNIDine (CATAPRES) 0.1 MG tablet Take 1 tablet (0.1 mg total) by mouth 2 (two) times daily.   Darbepoetin Alfa (ARANESP) 200 MCG/0.4ML SOSY injection Inject 0.4 mLs (200 mcg total) into the vein every Wednesday with hemodialysis.   EPINEPHrine (EPIPEN) 0.3 mg/0.3 mL SOAJ injection Inject 0.3 mLs (0.3 mg total) into the muscle once.   folic acid (FOLVITE) 1 MG tablet Take 1 tablet (1 mg total) by mouth daily.   heparin 1000 unit/mL SOLN injection 3 mLs (3,000 Units total) by Dialysis route as needed (in dialysis as needed).   heparin 1000 unit/mL SOLN injection 2 mLs (2,000 Units total) by Dialysis route as needed (in dialysis as needed).   heparin 1000 unit/mL SOLN injection 1 mL (1,000 Units total) by Dialysis route as needed (in dialysis).   hydrALAZINE (APRESOLINE) 100 MG tablet Take 1 tablet (100 mg total) by mouth every 8 (eight) hours.   isosorbide mononitrate (IMDUR) 30 MG 24 hr tablet Take 1 tablet (30 mg total) by mouth daily.   labetalol (NORMODYNE) 200 MG tablet TAKE 1 TABLET BY MOUTH THREE TIMES DAILY   LORazepam (ATIVAN) 1 MG tablet Take 1 mg by mouth every 8 (eight) hours as needed for anxiety.   montelukast (SINGULAIR) 10 MG tablet Take 10 mg by mouth daily with supper.    multivitamin (RENA-VIT) TABS tablet Take 1 tablet by mouth daily with supper.   olmesartan (BENICAR) 40 MG tablet Take 40 mg by mouth every evening.   Oxycodone HCl 10 MG TABS Take 10 mg by mouth 2 (two) times daily as needed for pain.   pantoprazole (PROTONIX) 40 MG tablet Take 40 mg  by mouth daily.   pentafluoroprop-tetrafluoroeth (GEBAUERS)  AERO Apply 1 application topically as needed (topical anesthesia for hemodialysis).   polyethylene glycol powder (GLYCOLAX/MIRALAX) 17 GM/SCOOP powder Mix 17 grams into 8 ounces of liquid and drink by mouth daily.   predniSONE (DELTASONE) 5 MG tablet Take 1 tablet (5 mg total) by mouth daily with breakfast.   Probiotic Product (PROBIOTIC PO) Take 1 capsule by mouth daily.   Tiotropium Bromide Monohydrate (SPIRIVA RESPIMAT IN) Inhale 2 puffs into the lungs daily.   VENTOLIN HFA 108 (90 Base) MCG/ACT inhaler Inhale 1-2 puffs into the lungs every 6 (six) hours as needed for shortness of breath. wheezing   zolpidem (AMBIEN) 5 MG tablet Take 5 mg by mouth at bedtime as needed for sleep.   gabapentin (NEURONTIN) 300 MG capsule Take 1 capsule (300 mg total) by mouth at bedtime as needed (Neuropathic pain).   [DISCONTINUED] chlorpheniramine-HYDROcodone (TUSSIONEX) 10-8 MG/5ML SUER Take 5 mLs by mouth every 12 (twelve) hours as needed for cough.   No facility-administered encounter medications on file as of 03/26/2021.     Review of Systems  Review of Systems  No chest pain with exertion.  No orthopnea or PND.  Comprehensive review of systems otherwise negative. Physical Exam  BP 130/74 (BP Location: Left Arm, Patient Position: Sitting, Cuff Size: Normal)    Pulse 72    Temp 98.8 F (37.1 C) (Oral)    Ht 5\' 2"  (1.575 m)    Wt 138 lb (62.6 kg)    SpO2 98%    BMI 25.24 kg/m   Wt Readings from Last 5 Encounters:  03/26/21 138 lb (62.6 kg)  03/19/21 148 lb 9.4 oz (67.4 kg)  02/14/21 148 lb 9.4 oz (67.4 kg)  01/10/21 137 lb (62.1 kg)  01/03/21 131 lb 2.8 oz (59.5 kg)    BMI Readings from Last 5 Encounters:  03/26/21 25.24 kg/m  03/19/21 27.18 kg/m  02/14/21 27.18 kg/m  01/10/21 25.06 kg/m  01/03/21 23.99 kg/m     Physical Exam Sitting in chair, in no acute distress Eyes: EOMI, no icterus Neck: Supple, JVP Pulmonary:  Normal work of breathing, clear Abdomen: Nondistended, bowel sounds present MSK: No synovitis, joint effusion Abdomen: Nondistended, bowel sounds present Neuro: Normal gait, no weakness Psych: Normal mood, full affect   Assessment & Plan:   Dyspnea on exertion: Likely related to cardiac causes as primary driver given left atrial dilation, elevated left atrial pressure, mitral and aortic valvular abnormalities with serial chest imaging demonstrating pulmonary edema.  Prior PFTs without fixed obstruction.  PFTs for further evaluation at next visit, delaying a period of months after recent COVID infection as to get most accurate information.  Chronic hypoxemic respiratory failure: 2 L with exertion per her report.  Walk today to see if qualifies for POC device.  Prior cross-sectional imaging without evidence of ILD.  Imaging consistently shows pulmonary edema and volume overload.  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  Counseled on low-salt diet.  Volume overload most likely contributor.  Possible VQ mismatch in the setting of presumed pulmonary hypertension on the basis of TTE.  Repeat PFTs as above.  Consider high-res CT scan in the future based on results she reports mild worsening symptoms after COVID infection 02/2021.   Return in about 4 months (around 07/24/2021).   Lanier Clam, MD 03/26/2021

## 2021-03-31 ENCOUNTER — Emergency Department (HOSPITAL_COMMUNITY)
Admission: EM | Admit: 2021-03-31 | Discharge: 2021-04-01 | Disposition: A | Discharge status: Home / Self Care | Payer: Medicare PPO | Attending: Emergency Medicine | Admitting: Emergency Medicine

## 2021-03-31 DIAGNOSIS — Z7951 Long term (current) use of inhaled steroids: Secondary | ICD-10-CM | POA: Insufficient documentation

## 2021-03-31 DIAGNOSIS — Z992 Dependence on renal dialysis: Secondary | ICD-10-CM | POA: Diagnosis not present

## 2021-03-31 DIAGNOSIS — Z79899 Other long term (current) drug therapy: Secondary | ICD-10-CM | POA: Diagnosis not present

## 2021-03-31 DIAGNOSIS — N186 End stage renal disease: Secondary | ICD-10-CM | POA: Diagnosis not present

## 2021-03-31 DIAGNOSIS — J449 Chronic obstructive pulmonary disease, unspecified: Secondary | ICD-10-CM | POA: Diagnosis not present

## 2021-03-31 DIAGNOSIS — E1122 Type 2 diabetes mellitus with diabetic chronic kidney disease: Secondary | ICD-10-CM | POA: Diagnosis not present

## 2021-03-31 DIAGNOSIS — J45909 Unspecified asthma, uncomplicated: Secondary | ICD-10-CM | POA: Insufficient documentation

## 2021-03-31 DIAGNOSIS — M545 Low back pain, unspecified: Secondary | ICD-10-CM | POA: Diagnosis present

## 2021-03-31 DIAGNOSIS — E1165 Type 2 diabetes mellitus with hyperglycemia: Secondary | ICD-10-CM | POA: Insufficient documentation

## 2021-03-31 DIAGNOSIS — Z7984 Long term (current) use of oral hypoglycemic drugs: Secondary | ICD-10-CM | POA: Diagnosis not present

## 2021-03-31 DIAGNOSIS — M5441 Lumbago with sciatica, right side: Secondary | ICD-10-CM | POA: Diagnosis not present

## 2021-03-31 DIAGNOSIS — I120 Hypertensive chronic kidney disease with stage 5 chronic kidney disease or end stage renal disease: Secondary | ICD-10-CM | POA: Insufficient documentation

## 2021-03-31 DIAGNOSIS — M5431 Sciatica, right side: Secondary | ICD-10-CM

## 2021-03-31 LAB — CBG MONITORING, ED: Glucose-Capillary: 118 mg/dL — ABNORMAL HIGH (ref 70–99)

## 2021-03-31 MED ORDER — DEXAMETHASONE SODIUM PHOSPHATE 10 MG/ML IJ SOLN
10.0000 mg | Freq: Once | INTRAMUSCULAR | Status: AC
Start: 2021-03-31 — End: 2021-03-31
  Administered 2021-03-31: 10 mg via INTRAMUSCULAR
  Filled 2021-03-31: qty 1

## 2021-03-31 NOTE — ED Provider Notes (Signed)
[facility] Provider Note   CSN: [id number] Arrival date & time: 03/31/21  2049     History Hx of lumbar surgery Chief Complaint  Patient presents with   Back Pain   Leg Pain    [facility] is a 61 y.o. female.  has a past medical history of Anemia of chronic disease, Anxiety, Arthritis, Asthma, Back pain, chronic, Candida infection (finished tx 2 days ago), Chronic insomnia, COPD (chronic obstructive pulmonary disease) ([place]), Coronary artery calcification seen on CT scan, Diabetes mellitus, Eczema, ESRD on dialysis ([facility]), Essential hypertension, GERD (gastroesophageal reflux disease), Gout, History of hiatal hernia, Hypoalbuminemia, Hyponatremia, Mild aortic stenosis, Mitral regurgitation, Mitral stenosis, Obesity, PAD (peripheral artery disease) ([place]), Pneumonia (few yrs ago x 2), Sleep apnea, Tobacco abuse, and Upper GI bleed (05/2019). Last HD on Friday 03-29-21.  The history is provided by the patient. No language interpreter was used.  Back Pain Location:  Gluteal region Quality:  Shooting Radiates to:  R posterior upper leg and R thigh Pain severity:  Severe Onset quality:  Gradual Duration:  2 weeks Timing:  Constant Progression:  Worsening Chronicity:  Recurrent Relieved by:  Nothing Worsened by:  Ambulation and movement Ineffective treatments: 10 mg oxycodone BID. Associated symptoms: leg pain   Leg Pain Associated symptoms: back pain   Denies weakness, loss of bowel/bladder function or saddle anesthesia.      Home Medications Prior to Admission medications   Medication Sig Start Date End Date Taking? Authorizing Provider  acetaminophen (TYLENOL) 500 MG tablet Take 1,000 mg by mouth every 6 (six) hours as needed for mild pain.    [provider]  allopurinol (ZYLOPRIM) 100 MG tablet Take 100 mg by mouth every evening.    [provider]  amLODipine (NORVASC) 10 MG tablet Take 10 mg by mouth daily.     [provider]  carboxymethylcellulose 1 % ophthalmic solution Place 1 drop into both eyes as needed (dry eyes).    [provider]  cholecalciferol (VITAMIN D) 25 MCG tablet Take 1 tablet (1,000 Units total) by mouth every evening. 10/04/20   Gold, Jodi Jupiter E, PA-C  cloNIDine (CATAPRES) 0.1 MG tablet Take 1 tablet (0.1 mg total) by mouth 2 (two) times daily. 10/04/20   [name], PA-C  Darbepoetin Alfa (ARANESP) 200 MCG/0.4ML SOSY injection Inject 0.4 mLs (200 mcg total) into the vein every Wednesday with hemodialysis. 10/10/20   Gold, Wilder Glade, PA-C  EPINEPHrine (EPIPEN) 0.3 mg/0.3 mL SOAJ injection Inject 0.3 mLs (0.3 mg total) into the muscle once. 09/30/12   Le, Thao P, DO  folic acid (FOLVITE) 1 MG tablet Take 1 tablet (1 mg total) by mouth daily. 10/04/20   Jadene Pierini E, PA-C  gabapentin (NEURONTIN) 300 MG capsule Take 1 capsule (300 mg total) by mouth at bedtime as needed (Neuropathic pain). 12/14/17 01/10/21  Dana Allan I, MD  heparin 1000 unit/mL SOLN injection 3 mLs (3,000 Units total) by Dialysis route as needed (in dialysis as needed). 10/04/20   Jadene Pierini E, PA-C  heparin 1000 unit/mL SOLN injection 2 mLs (2,000 Units total) by Dialysis route as needed (in dialysis as needed). 10/04/20   Jadene Pierini E, PA-C  heparin 1000 unit/mL SOLN injection 1 mL (1,000 Units total) by Dialysis route as needed (in dialysis). 10/04/20   Jadene Pierini E, PA-C  hydrALAZINE (APRESOLINE) 100 MG tablet Take 1 tablet (100 mg total) by mouth every 8 (eight) hours. 07/06/20   [name],  MD  isosorbide mononitrate (IMDUR) 30 MG 24 hr tablet Take 1 tablet (30 mg total) by mouth daily. 02/20/21   Jerline Pain, MD  labetalol (NORMODYNE) 200 MG tablet TAKE 1 TABLET BY MOUTH THREE TIMES DAILY 12/10/20   Gold, Jodi E, PA-C  LORazepam (ATIVAN) 1 MG tablet Take 1 mg by mouth every 8 (eight) hours as needed for anxiety. 06/25/20   [provider]  montelukast (SINGULAIR) 10 MG tablet Take  10 mg by mouth daily with supper.     [provider]  multivitamin (RENA-VIT) TABS tablet Take 1 tablet by mouth daily with supper.    [provider]  olmesartan (BENICAR) 40 MG tablet Take 40 mg by mouth every evening. 05/06/19   [provider]  Oxycodone HCl 10 MG TABS Take 10 mg by mouth 2 (two) times daily as needed for pain. 01/11/21   [provider]  pantoprazole (PROTONIX) 40 MG tablet Take 40 mg by mouth daily. 06/09/20   [provider]  pentafluoroprop-tetrafluoroeth Landry Dyke) AERO Apply 1 application topically as needed (topical anesthesia for hemodialysis). 10/04/20   Gold, Jodi Jupiter E, PA-C  polyethylene glycol powder (GLYCOLAX/MIRALAX) 17 GM/SCOOP powder Mix 17 grams into 8 ounces of liquid and drink by mouth daily. 03/19/21   Jodi Leitz, MD  predniSONE (DELTASONE) 5 MG tablet Take 1 tablet (5 mg total) by mouth daily with breakfast. 02/21/21   Jodi Dee, MD  Probiotic Product (PROBIOTIC PO) Take 1 capsule by mouth daily.    [provider]  Tiotropium Bromide Monohydrate (SPIRIVA RESPIMAT IN) Inhale 2 puffs into the lungs daily.    [provider]  VENTOLIN HFA 108 (90 Base) MCG/ACT inhaler Inhale 1-2 puffs into the lungs every 6 (six) hours as needed for shortness of breath. wheezing 06/13/15   [provider]  zolpidem (AMBIEN) 5 MG tablet Take 5 mg by mouth at bedtime as needed for sleep. 02/04/21   [provider]      Allergies    3-methyl-2-benzothiazolinone hydrazone, Banana, Black walnut flavor, Hazelnut (filbert) allergy skin test, Leflunomide and related, Lisinopril, No healthtouch food allergies, Other, Pecan extract allergy skin test, Pecan nut (diagnostic), Trazodone and nefazodone, Adalimumab, Escitalopram oxalate, Ezetimibe, Secukinumab, Statins, Ferrlecit [na ferric gluc cplx in sucrose], and Ibuprofen    Review of Systems   Review of Systems  Musculoskeletal:  Positive for back pain.    Physical Exam Updated Vital Signs BP (!) 162/77    Pulse 80    Resp 16    SpO2 97%  Physical Exam Vitals and nursing note reviewed.  Constitutional:      General: She is not in acute distress.    Appearance: She is well-developed. She is not diaphoretic.  HENT:     Head: Normocephalic and atraumatic.     Right Ear: External ear normal.     Left Ear: External ear normal.     Nose: Nose normal.     Mouth/Throat:     Mouth: Mucous membranes are moist.  Eyes:     General: No scleral icterus.    Conjunctiva/sclera: Conjunctivae normal.  Cardiovascular:     Rate and Rhythm: Normal rate and regular rhythm.     Heart sounds: Normal heart sounds. No murmur heard.   No friction rub. No gallop.  Pulmonary:     Effort: Pulmonary effort is normal. No respiratory distress.     Breath sounds: Normal breath sounds.  Abdominal:     General: Bowel sounds are  normal. There is no distension.     Palpations: Abdomen is soft. There is no mass.     Tenderness: There is no abdominal tenderness. There is no guarding.  Musculoskeletal:     Cervical back: Normal range of motion.     Comments: No midline spinal tenderness, bilateral musculoskeletal spastic tissue, worse on the right.  Positive straight leg test on the right, reflexes 2+ at the patella bilaterally, normal strength with plantar and dorsiflexion bilaterally.  Skin:    General: Skin is warm and dry.  Neurological:     Mental Status: She is alert and oriented to person, place, and time.  Psychiatric:        Behavior: Behavior normal.    ED Results / Procedures / Treatments   Labs (all labs ordered are listed, but only abnormal results are displayed) Labs Reviewed - No data to display  EKG None  Radiology No results found.  Procedures Procedures    Medications Ordered in ED Medications - No data to display  ED Course/ Medical Decision Making/ A&P Clinical Course as of 04/01/21 0106  Sun Mar 31, 2021  2311 DG Lumbar  Spine Complete I reviewed images of a lumbar plain film which show chronic degenerative changes and surgical changes without acute finding [AH]    Clinical Course User Index [AH] Margarita Mail, PA-C                           Medical Decision Making 61 year old female with a past medical history of chronic back pain, history of back surgeries, no recent injuries, complaining of symptoms consistent with sciatica.  She has a history of the same.  Patient has a follow-up appointment with her previous back surgeon.  She was seen recently with the same complaint.  Patient given a dose of Decadron and IM morphine here for her pain.  She was reluctant to be discharged however she has a walker at home and I explained that the steroid should kick in to help with her pain, that also the pain medication shot should help.  She has a walker at home to help with ambulation.  I explained that our pain medication options are limited due to her history of end-stage renal disease and being under pain contract however I felt a Medrol Dosepak would very likely help her improve her ability to function daily. Patient has no red flag symptoms.  I visualized her lumbar film images which show chronic surgical and degenerative changes along with lumbar stenosis which may be the cause of her symptoms.  She has outpatient follow-up and appears appropriate for discharge at this time   Amount and/or Complexity of Data Reviewed Labs: ordered.    Details: cbg 118    Final Clinical Impression(s) / ED Diagnoses Final diagnoses:  None    Rx / DC Orders ED Discharge Orders     None         Margarita Mail, PA-C 04/01/21 0107    Quintella Reichert, MD 04/01/21 [phone]

## 2021-03-31 NOTE — ED Triage Notes (Addendum)
Pt endorses back pain that radiates to R leg. Pt seen on 2/7 for same issue. Has [facility] appt on 2/21

## 2021-03-31 NOTE — ED Notes (Signed)
Received verbal report from [facility] at this time

## 2021-04-01 ENCOUNTER — Emergency Department (HOSPITAL_COMMUNITY): Payer: Medicare PPO

## 2021-04-01 ENCOUNTER — Other Ambulatory Visit (HOSPITAL_BASED_OUTPATIENT_CLINIC_OR_DEPARTMENT_OTHER): Payer: Self-pay

## 2021-04-01 MED ORDER — MORPHINE SULFATE (PF) 4 MG/ML IV SOLN
8.0000 mg | Freq: Once | INTRAVENOUS | Status: AC
  Administered 2021-04-01: 8 mg via INTRAMUSCULAR
  Filled 2021-04-01: qty 2

## 2021-04-01 MED ORDER — METHYLPREDNISOLONE 4 MG PO TBPK: ORAL_TABLET | ORAL | 0 refills | Status: DC

## 2021-04-01 MED ORDER — METHYLPREDNISOLONE 4 MG PO TBPK
ORAL_TABLET | ORAL | 0 refills | Status: DC
  Filled 2021-04-01: qty 21, fill #0

## 2021-04-01 NOTE — ED Notes (Signed)
Pt to radiology at this time.

## 2021-04-01 NOTE — Discharge Instructions (Signed)
SEEK IMMEDIATE MEDICAL ATTENTION IF: New numbness, tingling, weakness, or problem with the use of your arms or legs.  Severe back pain not relieved with medications.  Change in bowel or bladder control.  Increasing pain in any areas of the body (such as chest or abdominal pain).  Shortness of breath, dizziness or fainting.  Nausea (feeling sick to your stomach), vomiting, fever, or sweats.  

## 2021-04-01 NOTE — ED Notes (Signed)
Pt requesting more pain meds. Advised pt I would be there with pain meds and d/c her home. Pt stated that she wanted the pain meds but didn't want to be d/c. Provider made aware and at bedside at this time

## 2021-04-01 NOTE — ED Notes (Signed)
Pt is moaning and yelling requesting something for pain. Has used the nurse call bell 3 times requesting pain meds. Message sent to the provider reference same.

## 2021-04-15 ENCOUNTER — Telehealth: Payer: Self-pay | Admitting: Pulmonary Disease

## 2021-04-15 DIAGNOSIS — R0609 Other forms of dyspnea: Secondary | ICD-10-CM

## 2021-04-15 NOTE — Telephone Encounter (Signed)
2L via POC. Thanks!

## 2021-04-15 NOTE — Telephone Encounter (Signed)
Called patient and I told her the order for a POC was never placed.  ? ?I told patient that I has to verify how much O2 she needed to be on.  ? ?Dr Silas Flood how much oxygen does she need to be on for the POC order.  ? ?Apparently I never placed the order for her POC so this is my fault.  ? ?Please advise Dr Silas Flood  ?

## 2021-04-16 NOTE — Telephone Encounter (Signed)
Looks like needed oxygen 12/2020 - unsure if ever used O2 before that.

## 2021-04-16 NOTE — Telephone Encounter (Signed)
Called patient and she states that her current DME [facility] doesn't carry the POCs. ? ?Would you like to change her DME? ? ?How long has she been on oxygen? We can switch her over to [facility] if she has been on oxygen for a year.  ? ?Thank you  ? ?Please advise Dr Silas Flood  ?

## 2021-04-17 ENCOUNTER — Encounter: Payer: Self-pay | Admitting: Pulmonary Disease

## 2021-04-17 ENCOUNTER — Other Ambulatory Visit: Payer: Self-pay

## 2021-04-17 ENCOUNTER — Encounter (HOSPITAL_BASED_OUTPATIENT_CLINIC_OR_DEPARTMENT_OTHER): Payer: Self-pay

## 2021-04-17 ENCOUNTER — Emergency Department (HOSPITAL_BASED_OUTPATIENT_CLINIC_OR_DEPARTMENT_OTHER)
Admission: EM | Admit: 2021-04-17 | Discharge: 2021-04-17 | Disposition: A | Discharge status: Home / Self Care | Payer: Medicare PPO | Attending: Emergency Medicine | Admitting: Emergency Medicine

## 2021-04-17 DIAGNOSIS — D649 Anemia, unspecified: Secondary | ICD-10-CM | POA: Diagnosis not present

## 2021-04-17 DIAGNOSIS — K625 Hemorrhage of anus and rectum: Secondary | ICD-10-CM | POA: Diagnosis not present

## 2021-04-17 LAB — COMPREHENSIVE METABOLIC PANEL
ALT: 14 U/L (ref 0–44)
AST: 18 U/L (ref 15–41)
Albumin: 3.5 g/dL (ref 3.5–5.0)
Alkaline Phosphatase: 154 U/L — ABNORMAL HIGH (ref 38–126)
Anion gap: 11 (ref 5–15)
BUN: 28 mg/dL — ABNORMAL HIGH (ref 6–20)
CO2: 27 mmol/L (ref 22–32)
Calcium: 8.8 mg/dL — ABNORMAL LOW (ref 8.9–10.3)
Chloride: 99 mmol/L (ref 98–111)
Creatinine, Ser: 4.01 mg/dL — ABNORMAL HIGH (ref 0.44–1.00)
GFR, Estimated: 12 mL/min — ABNORMAL LOW (ref >60)
Glucose, Bld: 138 mg/dL — ABNORMAL HIGH (ref 70–99)
Potassium: 4.6 mmol/L (ref 3.5–5.1)
Sodium: 137 mmol/L (ref 135–145)
Total Bilirubin: 0.7 mg/dL (ref 0.3–1.2)
Total Protein: 6.6 g/dL (ref 6.5–8.1)

## 2021-04-17 LAB — CBC
HCT: 32.3 % — ABNORMAL LOW (ref 36.0–46.0)
Hemoglobin: 9.9 g/dL — ABNORMAL LOW (ref 12.0–15.0)
MCH: 32.1 pg (ref 26.0–34.0)
MCHC: 30.7 g/dL (ref 30.0–36.0)
MCV: 104.9 fL — ABNORMAL HIGH (ref 80.0–100.0)
Platelets: 129 10*3/uL — ABNORMAL LOW (ref 150–400)
RBC: 3.08 MIL/uL — ABNORMAL LOW (ref 3.87–5.11)
RDW: 15.9 % — ABNORMAL HIGH (ref 11.5–15.5)
WBC: 3.8 10*3/uL — ABNORMAL LOW (ref 4.0–10.5)
nRBC: 0 % (ref 0.0–0.2)

## 2021-04-17 LAB — OCCULT BLOOD X 1 CARD TO LAB, STOOL: Fecal Occult Bld: POSITIVE — AB

## 2021-04-17 NOTE — Telephone Encounter (Signed)
Placed new order for POC unit through [facility]. Called patient to inform her of the new order since [facility] doesn't carry POC units. Nothing further needed ?

## 2021-04-17 NOTE — ED Notes (Signed)
PT STATES SHE IS ON O2 2-3LNC 24/7-DOES NOT HAVE O2 WITH HER-RT PLACED PT ON O2 2LNC ?

## 2021-04-17 NOTE — ED Provider Notes (Signed)
[facility] EMERGENCY DEPARTMENT Provider Note   CSN: [id number] Arrival date & time: 04/17/21  1344     History  Chief Complaint  Patient presents with   Rectal Bleeding    [facility] is a 61 y.o. female.  Patient presents chief complaint of bloody stool.  She had an episode of bloody stool yesterday.  And then states she had 2 episodes today described as purplish reddish in color.  Otherwise denies abdominal pain.  Denies fevers or cough or vomiting or diarrhea.  She states that she had similar bloody stools in August of last year, she had gastroenterology look at it and was told that it was from ulcerative bleeding from a hemorrhoid.  Otherwise denies any chest pain or shortness of breath denies any dizziness or lightheadedness.  She is dialysis dependent had hemodialysis today and is typically dependent on 2 L nasal cannula support.      Home Medications Prior to Admission medications   Medication Sig Start Date End Date Taking? Authorizing Provider  acetaminophen (TYLENOL) 500 MG tablet Take 1,000 mg by mouth every 6 (six) hours as needed for mild pain.    [provider]  allopurinol (ZYLOPRIM) 100 MG tablet Take 100 mg by mouth every evening.    [provider]  amLODipine (NORVASC) 10 MG tablet Take 10 mg by mouth daily.    [provider]  carboxymethylcellulose 1 % ophthalmic solution Place 1 drop into both eyes as needed (dry eyes).    [provider]  cholecalciferol (VITAMIN D) 25 MCG tablet Take 1 tablet (1,000 Units total) by mouth every evening. 10/04/20   Gold, Patrick Jupiter E, PA-C  cloNIDine (CATAPRES) 0.1 MG tablet Take 1 tablet (0.1 mg total) by mouth 2 (two) times daily. 10/04/20   [name], PA-C  Darbepoetin Alfa (ARANESP) 200 MCG/0.4ML SOSY injection Inject 0.4 mLs (200 mcg total) into the vein every Wednesday with hemodialysis. 10/10/20   Gold, Wilder Glade, PA-C  EPINEPHrine (EPIPEN) 0.3 mg/0.3 mL SOAJ injection Inject  0.3 mLs (0.3 mg total) into the muscle once. 09/30/12   Le, Thao P, DO  folic acid (FOLVITE) 1 MG tablet Take 1 tablet (1 mg total) by mouth daily. 10/04/20   Jadene Pierini E, PA-C  gabapentin (NEURONTIN) 300 MG capsule Take 1 capsule (300 mg total) by mouth at bedtime as needed (Neuropathic pain). 12/14/17 01/10/21  Dana Allan I, MD  heparin 1000 unit/mL SOLN injection 3 mLs (3,000 Units total) by Dialysis route as needed (in dialysis as needed). 10/04/20   Jadene Pierini E, PA-C  heparin 1000 unit/mL SOLN injection 2 mLs (2,000 Units total) by Dialysis route as needed (in dialysis as needed). 10/04/20   Jadene Pierini E, PA-C  heparin 1000 unit/mL SOLN injection 1 mL (1,000 Units total) by Dialysis route as needed (in dialysis). 10/04/20   Jadene Pierini E, PA-C  hydrALAZINE (APRESOLINE) 100 MG tablet Take 1 tablet (100 mg total) by mouth every 8 (eight) hours. 07/06/20   [name], MD  isosorbide mononitrate (IMDUR) 30 MG 24 hr tablet Take 1 tablet (30 mg total) by mouth daily. 02/20/21   Jerline Pain, MD  labetalol (NORMODYNE) 200 MG tablet TAKE 1 TABLET BY MOUTH THREE TIMES DAILY 12/10/20   Gold, Wayne E, PA-C  LORazepam (ATIVAN) 1 MG tablet Take 1 mg by mouth every 8 (eight) hours as needed for anxiety. 06/25/20   [provider]  methylPREDNISolone (MEDROL DOSEPAK) 4 MG TBPK tablet Use as  directed 04/01/21   Margarita Mail, PA-C  montelukast (SINGULAIR) 10 MG tablet Take 10 mg by mouth daily with supper.     [provider]  multivitamin (RENA-VIT) TABS tablet Take 1 tablet by mouth daily with supper.    [provider]  olmesartan (BENICAR) 40 MG tablet Take 40 mg by mouth every evening. 05/06/19   [provider]  Oxycodone HCl 10 MG TABS Take 10 mg by mouth 2 (two) times daily as needed for pain. 01/11/21   [provider]  pantoprazole (PROTONIX) 40 MG tablet Take 40 mg by mouth daily. 06/09/20   [provider]  pentafluoroprop-tetrafluoroeth  Landry Dyke) AERO Apply 1 application topically as needed (topical anesthesia for hemodialysis). 10/04/20   Gold, Patrick Jupiter E, PA-C  polyethylene glycol powder (GLYCOLAX/MIRALAX) 17 GM/SCOOP powder Mix 17 grams into 8 ounces of liquid and drink by mouth daily. 03/19/21   Carollee Leitz, MD  predniSONE (DELTASONE) 5 MG tablet Take 1 tablet (5 mg total) by mouth daily with breakfast. 02/21/21   Dwyane Dee, MD  Probiotic Product (PROBIOTIC PO) Take 1 capsule by mouth daily.    [provider]  Tiotropium Bromide Monohydrate (SPIRIVA RESPIMAT IN) Inhale 2 puffs into the lungs daily.    [provider]  VENTOLIN HFA 108 (90 Base) MCG/ACT inhaler Inhale 1-2 puffs into the lungs every 6 (six) hours as needed for shortness of breath. wheezing 06/13/15   [provider]  zolpidem (AMBIEN) 5 MG tablet Take 5 mg by mouth at bedtime as needed for sleep. 02/04/21   [provider]      Allergies    3-methyl-2-benzothiazolinone hydrazone, Banana, Black walnut flavor, Hazelnut (filbert) allergy skin test, Leflunomide and related, Lisinopril, No healthtouch food allergies, Other, Pecan extract allergy skin test, Pecan nut (diagnostic), Trazodone and nefazodone, Adalimumab, Escitalopram oxalate, Ezetimibe, Secukinumab, Statins, Ferrlecit [na ferric gluc cplx in sucrose], and Ibuprofen    Review of Systems   Review of Systems  Constitutional:  Negative for chills and fever.  HENT:  Negative for ear pain and sore throat.   Eyes:  Negative for pain and visual disturbance.  Respiratory:  Negative for cough and shortness of breath.   Cardiovascular:  Negative for chest pain and palpitations.  Gastrointestinal:  Negative for abdominal pain and vomiting.  Genitourinary:  Negative for dysuria and hematuria.  Musculoskeletal:  Negative for arthralgias and back pain.  Skin:  Negative for color change and rash.  Neurological:  Negative for seizures and syncope.  All other systems reviewed and  are negative.  Physical Exam Updated Vital Signs BP (!) 190/86    Pulse 79    Temp 98.1 F (36.7 C) (Oral)    Resp 12    Ht 5\' 2"  (1.575 m)    Wt 62 kg    SpO2 99%    BMI 25.00 kg/m  Physical Exam Constitutional:      General: She is not in acute distress.    Appearance: Normal appearance.  HENT:     Head: Normocephalic.     Nose: Nose normal.  Eyes:     Extraocular Movements: Extraocular movements intact.  Cardiovascular:     Rate and Rhythm: Normal rate.  Pulmonary:     Effort: Pulmonary effort is normal.  Genitourinary:    Comments: Rectal exam performed with chaperone nurse present.  Exam showed normal brown-yellow color stool.  However guaiac positive from the lab.  No active bleeding seen on exam. Musculoskeletal:  General: Normal range of motion.     Cervical back: Normal range of motion.  Neurological:     General: No focal deficit present.     Mental Status: She is alert. Mental status is at baseline.    ED Results / Procedures / Treatments   Labs (all labs ordered are listed, but only abnormal results are displayed) Labs Reviewed  COMPREHENSIVE METABOLIC PANEL - Abnormal; Notable for the following components:      Result Value   Glucose, Bld 138 (*)    BUN 28 (*)    Creatinine, Ser 4.01 (*)    Calcium 8.8 (*)    Alkaline Phosphatase 154 (*)    GFR, Estimated 12 (*)    All other components within normal limits  CBC - Abnormal; Notable for the following components:   WBC 3.8 (*)    RBC 3.08 (*)    Hemoglobin 9.9 (*)    HCT 32.3 (*)    MCV 104.9 (*)    RDW 15.9 (*)    Platelets 129 (*)    All other components within normal limits  OCCULT BLOOD X 1 CARD TO LAB, STOOL - Abnormal; Notable for the following components:   Fecal Occult Bld POSITIVE (*)    All other components within normal limits    EKG None  Radiology No results found.  Procedures Procedures    Medications Ordered in ED Medications - No data to display  ED Course/ Medical  Decision Making/ A&P                           Medical Decision Making Amount and/or Complexity of Data Reviewed External Data Reviewed: notes.    Details: Chart review shows note from physical therapist yesterday for chronic back pain. Labs: ordered.    Details: Labs today within normal limits hemoglobin normal at 9.9 mildly anemic but higher than prior levels.  Risk Decision regarding hospitalization. Risk Details: Patient presents with bloody stools for 2 days.  Exam is relatively benign with no brisk or active bleeding noted.  She is guaiac positive however.  Given hemoglobin higher than her typical baseline, normal vitals, no brisk or active bleeding, I do not feel the patient requires admission today.  Recommending outpatient follow-up with gastroenterology.  She states that she did see [facility] in the past, advised follow-up in their lab this week.  Advised immediate return for worsening symptoms or any additional concerns.           Final Clinical Impression(s) / ED Diagnoses Final diagnoses:  Rectal bleeding    Rx / DC Orders ED Discharge Orders     None         Luna Fuse, MD 04/17/21 [phone]

## 2021-04-17 NOTE — Progress Notes (Signed)
SATURATION QUALIFICATIONS: (This note is used to comply with regulatory documentation for home oxygen) ? ?Patient Saturations on Room Air at Rest = 91% ? ?Patient Saturations on Room Air while Ambulating = 89% ? ?Patient Saturations on 2-3L Liters of oxygen while Ambulating = 94% ? ?Please briefly explain why patient needs home oxygen: ?

## 2021-04-17 NOTE — ED Triage Notes (Signed)
Pt c/o rectal bleeding with stools started yesterday-reports hx of same-NAD-to triage in w/c ?

## 2021-04-17 NOTE — Discharge Instructions (Addendum)
Call your primary care doctor or specialist as discussed in the next 2-3 days.   Return immediately back to the ER if:  Your symptoms worsen within the next 12-24 hours. You develop new symptoms such as new fevers, persistent vomiting, new pain, shortness of breath, or new weakness or numbness, or if you have any other concerns.  

## 2021-04-17 NOTE — ED Notes (Signed)
Placed on [facility] via tank. Patient wears 02 at home, and did not bring it with her. ?

## 2021-04-27 ENCOUNTER — Other Ambulatory Visit: Payer: Self-pay | Admitting: Surgical

## 2021-05-01 ENCOUNTER — Ambulatory Visit (HOSPITAL_BASED_OUTPATIENT_CLINIC_OR_DEPARTMENT_OTHER): Payer: Medicare PPO | Admitting: Pulmonary Disease

## 2021-05-02 ENCOUNTER — Emergency Department (HOSPITAL_COMMUNITY)
Admission: EM | Admit: 2021-05-02 | Discharge: 2021-05-03 | Disposition: A | Discharge status: Home / Self Care | Payer: Medicare PPO | Attending: Emergency Medicine | Admitting: Emergency Medicine

## 2021-05-02 ENCOUNTER — Emergency Department (HOSPITAL_COMMUNITY): Payer: Medicare PPO

## 2021-05-02 ENCOUNTER — Other Ambulatory Visit: Payer: Self-pay

## 2021-05-02 DIAGNOSIS — M5416 Radiculopathy, lumbar region: Secondary | ICD-10-CM | POA: Insufficient documentation

## 2021-05-02 DIAGNOSIS — R69 Illness, unspecified: Secondary | ICD-10-CM | POA: Insufficient documentation

## 2021-05-02 DIAGNOSIS — G8929 Other chronic pain: Secondary | ICD-10-CM | POA: Diagnosis not present

## 2021-05-02 DIAGNOSIS — Z992 Dependence on renal dialysis: Secondary | ICD-10-CM | POA: Insufficient documentation

## 2021-05-02 DIAGNOSIS — Z20822 Contact with and (suspected) exposure to covid-19: Secondary | ICD-10-CM | POA: Diagnosis not present

## 2021-05-02 DIAGNOSIS — Y900 Blood alcohol level of less than 20 mg/100 ml: Secondary | ICD-10-CM | POA: Insufficient documentation

## 2021-05-02 DIAGNOSIS — M79604 Pain in right leg: Secondary | ICD-10-CM | POA: Diagnosis present

## 2021-05-02 DIAGNOSIS — R29898 Other symptoms and signs involving the musculoskeletal system: Secondary | ICD-10-CM

## 2021-05-02 DIAGNOSIS — M5441 Lumbago with sciatica, right side: Secondary | ICD-10-CM | POA: Diagnosis not present

## 2021-05-02 LAB — COMPREHENSIVE METABOLIC PANEL
ALT: 13 U/L (ref 0–44)
AST: 16 U/L (ref 15–41)
Albumin: 3.4 g/dL — ABNORMAL LOW (ref 3.5–5.0)
Alkaline Phosphatase: 164 U/L — ABNORMAL HIGH (ref 38–126)
Anion gap: 9 (ref 5–15)
BUN: 32 mg/dL — ABNORMAL HIGH (ref 6–20)
CO2: 27 mmol/L (ref 22–32)
Calcium: 8.5 mg/dL — ABNORMAL LOW (ref 8.9–10.3)
Chloride: 102 mmol/L (ref 98–111)
Creatinine, Ser: 5.82 mg/dL — ABNORMAL HIGH (ref 0.44–1.00)
GFR, Estimated: 8 mL/min — ABNORMAL LOW (ref >60)
Glucose, Bld: 130 mg/dL — ABNORMAL HIGH (ref 70–99)
Potassium: 4.8 mmol/L (ref 3.5–5.1)
Sodium: 138 mmol/L (ref 135–145)
Total Bilirubin: 0.5 mg/dL (ref 0.3–1.2)
Total Protein: 6.2 g/dL — ABNORMAL LOW (ref 6.5–8.1)

## 2021-05-02 LAB — CBC
HCT: 32.8 % — ABNORMAL LOW (ref 36.0–46.0)
Hemoglobin: 9.9 g/dL — ABNORMAL LOW (ref 12.0–15.0)
MCH: 32.8 pg (ref 26.0–34.0)
MCHC: 30.2 g/dL (ref 30.0–36.0)
MCV: 108.6 fL — ABNORMAL HIGH (ref 80.0–100.0)
Platelets: 136 10*3/uL — ABNORMAL LOW (ref 150–400)
RBC: 3.02 MIL/uL — ABNORMAL LOW (ref 3.87–5.11)
RDW: 16 % — ABNORMAL HIGH (ref 11.5–15.5)
WBC: 3.8 10*3/uL — ABNORMAL LOW (ref 4.0–10.5)
nRBC: 0 % (ref 0.0–0.2)

## 2021-05-02 LAB — I-STAT CHEM 8, ED
BUN: 33 mg/dL — ABNORMAL HIGH (ref 6–20)
Calcium, Ion: 1.09 mmol/L — ABNORMAL LOW (ref 1.15–1.40)
Chloride: 100 mmol/L (ref 98–111)
Creatinine, Ser: 5.5 mg/dL — ABNORMAL HIGH (ref 0.44–1.00)
Glucose, Bld: 124 mg/dL — ABNORMAL HIGH (ref 70–99)
HCT: 34 % — ABNORMAL LOW (ref 36.0–46.0)
Hemoglobin: 11.6 g/dL — ABNORMAL LOW (ref 12.0–15.0)
Potassium: 4.7 mmol/L (ref 3.5–5.1)
Sodium: 137 mmol/L (ref 135–145)
TCO2: 28 mmol/L (ref 22–32)

## 2021-05-02 LAB — DIFFERENTIAL
Abs Immature Granulocytes: 0.01 10*3/uL (ref 0.00–0.07)
Basophils Absolute: 0 10*3/uL (ref 0.0–0.1)
Basophils Relative: 1 %
Eosinophils Absolute: 0.5 10*3/uL (ref 0.0–0.5)
Eosinophils Relative: 13 %
Immature Granulocytes: 0 %
Lymphocytes Relative: 17 %
Lymphs Abs: 0.6 10*3/uL — ABNORMAL LOW (ref 0.7–4.0)
Monocytes Absolute: 0.5 10*3/uL (ref 0.1–1.0)
Monocytes Relative: 12 %
Neutro Abs: 2.2 10*3/uL (ref 1.7–7.7)
Neutrophils Relative %: 57 %

## 2021-05-02 LAB — PROTIME-INR
INR: 1.2 (ref 0.8–1.2)
Prothrombin Time: 15 seconds (ref 11.4–15.2)

## 2021-05-02 LAB — RESP PANEL BY RT-PCR (FLU A&B, COVID) ARPGX2
Influenza A by PCR: NEGATIVE
Influenza B by PCR: NEGATIVE
SARS Coronavirus 2 by RT PCR: NEGATIVE

## 2021-05-02 LAB — APTT: aPTT: 38 seconds — ABNORMAL HIGH (ref 24–36)

## 2021-05-02 LAB — ETHANOL: Alcohol, Ethyl (B): <10 mg/dL (ref <10)

## 2021-05-02 MED ORDER — HYDROMORPHONE HCL 1 MG/ML IJ SOLN
0.5000 mg | Freq: Once | INTRAMUSCULAR | Status: AC
  Administered 2021-05-02: 0.5 mg via INTRAMUSCULAR

## 2021-05-02 MED ORDER — HYDROMORPHONE HCL 1 MG/ML IJ SOLN
0.5000 mg | Freq: Once | INTRAMUSCULAR | Status: DC
Start: 2021-05-02 — End: 2021-05-02
  Filled 2021-05-02: qty 1

## 2021-05-02 NOTE — ED Triage Notes (Signed)
Pt here via ems from [facility] with RLE weakness/numbness since 1000 yesterday. Pt went to get up from dialysis and noticed difficulty walking. No other neuro deficits noted.  ?BP 200/110 ?

## 2021-05-02 NOTE — ED Provider Notes (Signed)
?[facility] ?Provider Note ? ? ?CSN: [id number] ?Arrival date & time: 05/02/21  1428 ? ?  ? ?History ? ?No chief complaint on file. ? ? ?Jodi Bell is a 61 y.o. female. ? ?HPI ?16:53 Not in room 25. EMR and diagnostics reviewed. ? ?Patient reports that she was starting to have difficulty walking due to her right leg.  Patient ports that she noticed yesterday that she was having some discomfort in the leg.  She felt that there was an area that seem kind of swollen on the medial thigh on the right and had a feeling of lumpy nodules under the skin where her vein harvest graft had been.  After speaking with some other people she became concerned about may be a blood clot present.  However when she went to her dialysis she reports she was having some trouble walking and someone else raised the concern of weakness from possible strokelike symptoms.  Patient reports symptoms were most noticeable to her since 10:00 yesterday.  She denies having any problems in her arms or vision or facial weakness or numbness.  Patient does also note that she has problems with chronic back pain and takes oxycodone for back pain but it does not help much. ? ? ?  ? ?Home Medications ?Prior to Admission medications   ?Medication Sig Start Date End Date Taking? Authorizing Provider  ?predniSONE (DELTASONE) 20 MG tablet 2 tabs po daily x 4 days 05/03/21  Yes Angala Hilgers, Jeannie Done, MD  ?acetaminophen (TYLENOL) 500 MG tablet Take 1,000 mg by mouth every 6 (six) hours as needed for mild pain.    [provider]  ?allopurinol (ZYLOPRIM) 100 MG tablet Take 100 mg by mouth every evening.    [provider]  ?amLODipine (NORVASC) 10 MG tablet Take 10 mg by mouth daily.    [provider]  ?carboxymethylcellulose 1 % ophthalmic solution Place 1 drop into both eyes as needed (dry eyes).    [provider]  ?cholecalciferol (VITAMIN D) 25 MCG tablet Take 1 tablet (1,000 Units total)  by mouth every evening. 10/04/20   Jadene Pierini E, PA-C  ?cloNIDine (CATAPRES) 0.1 MG tablet Take 1 tablet (0.1 mg total) by mouth 2 (two) times daily. 10/04/20   [name], PA-C  ?Darbepoetin Alfa (ARANESP) 200 MCG/0.4ML SOSY injection Inject 0.4 mLs (200 mcg total) into the vein every Wednesday with hemodialysis. 10/10/20   [name], PA-C  ?EPINEPHrine (EPIPEN) 0.3 mg/0.3 mL SOAJ injection Inject 0.3 mLs (0.3 mg total) into the muscle once. 09/30/12   Le, Thao P, DO  ?folic acid (FOLVITE) 1 MG tablet Take 1 tablet (1 mg total) by mouth daily. 10/04/20   [name], PA-C  ?gabapentin (NEURONTIN) 300 MG capsule Take 1 capsule (300 mg total) by mouth at bedtime as needed (Neuropathic pain). 12/14/17 01/10/21  Dana Allan I, MD  ?heparin 1000 unit/mL SOLN injection 3 mLs (3,000 Units total) by Dialysis route as needed (in dialysis as needed). 10/04/20   Jadene Pierini E, PA-C  ?heparin 1000 unit/mL SOLN injection 2 mLs (2,000 Units total) by Dialysis route as needed (in dialysis as needed). 10/04/20   Jadene Pierini E, PA-C  ?heparin 1000 unit/mL SOLN injection 1 mL (1,000 Units total) by Dialysis route as needed (in dialysis). 10/04/20   [name], PA-C  ?hydrALAZINE (APRESOLINE) 100 MG tablet Take 1 tablet (100 mg total) by mouth every 8 (eight) hours. 07/06/20   [name], MD  ?  isosorbide mononitrate (IMDUR) 30 MG 24 hr tablet Take 1 tablet (30 mg total) by mouth daily. 02/20/21   Jerline Pain, MD  ?labetalol (NORMODYNE) 200 MG tablet TAKE 1 TABLET BY MOUTH THREE TIMES DAILY 12/10/20   [name], PA-C  ?LORazepam (ATIVAN) 1 MG tablet Take 1 mg by mouth every 8 (eight) hours as needed for anxiety. 06/25/20   [provider]  ?methylPREDNISolone (MEDROL DOSEPAK) 4 MG TBPK tablet Use as directed 04/01/21   Margarita Mail, PA-C  ?montelukast (SINGULAIR) 10 MG tablet Take 10 mg by mouth daily with supper.     [provider]  ?multivitamin (RENA-VIT) TABS tablet Take 1 tablet by mouth  daily with supper.    [provider]  ?olmesartan (BENICAR) 40 MG tablet Take 40 mg by mouth every evening. 05/06/19   [provider]  ?Oxycodone HCl 10 MG TABS Take 10 mg by mouth 2 (two) times daily as needed for pain. 01/11/21   [provider]  ?pantoprazole (PROTONIX) 40 MG tablet Take 40 mg by mouth daily. 06/09/20   [provider]  ?pentafluoroprop-tetrafluoroeth Landry Dyke) AERO Apply 1 application topically as needed (topical anesthesia for hemodialysis). 10/04/20   Jadene Pierini E, PA-C  ?polyethylene glycol powder (GLYCOLAX/MIRALAX) 17 GM/SCOOP powder Mix 17 grams into 8 ounces of liquid and drink by mouth daily. 03/19/21   Carollee Leitz, MD  ?predniSONE (DELTASONE) 5 MG tablet Take 1 tablet (5 mg total) by mouth daily with breakfast. 02/21/21   Dwyane Dee, MD  ?Probiotic Product (PROBIOTIC PO) Take 1 capsule by mouth daily.    [provider]  ?Tiotropium Bromide Monohydrate (SPIRIVA RESPIMAT IN) Inhale 2 puffs into the lungs daily.    [provider]  ?VENTOLIN HFA 108 (90 Base) MCG/ACT inhaler Inhale 1-2 puffs into the lungs every 6 (six) hours as needed for shortness of breath. wheezing 06/13/15   [provider]  ?zolpidem (AMBIEN) 5 MG tablet Take 5 mg by mouth at bedtime as needed for sleep. 02/04/21   [provider]  ?   ? ?Allergies    ?3-methyl-2-benzothiazolinone hydrazone, Banana, Black walnut flavor, Hazelnut (filbert) allergy skin test, Leflunomide and related, Lisinopril, No healthtouch food allergies, Other, Pecan extract allergy skin test, Pecan nut (diagnostic), Trazodone and nefazodone, Adalimumab, Escitalopram oxalate, Ezetimibe, Secukinumab, Statins, Ferrlecit [na ferric gluc cplx in sucrose], and Ibuprofen   ? ?Review of Systems   ?Review of Systems ?10 systems reviewed negative except as per HPI ?Physical Exam ?Updated Vital Signs ?BP (!) 160/106   Pulse 77   Temp 98.4 ?F (36.9 ?C) (Oral)   Resp 16   Ht 5\' 2"   (1.575 m)   Wt 62 kg   SpO2 99%   BMI 25.00 kg/m?  ?Physical Exam ?Constitutional:   ?   Comments: Alert, normal mental status.  GCS 15.  ?HENT:  ?   Mouth/Throat:  ?   Pharynx: Oropharynx is clear.  ?Eyes:  ?   Extraocular Movements: Extraocular movements intact.  ?Cardiovascular:  ?   Rate and Rhythm: Normal rate and regular rhythm.  ?Pulmonary:  ?   Effort: Pulmonary effort is normal.  ?   Breath sounds: Normal breath sounds.  ?Abdominal:  ?   General: There is no distension.  ?   Palpations: Abdomen is soft.  ?   Tenderness: There is no abdominal tenderness. There is no guarding.  ?Musculoskeletal:     ?   General: No swelling or tenderness. Normal range of motion.  ?  Cervical back: Neck supple.  ?   Comments: Examination of the right lower extremity shows normal soft tissues.  Patient does have some subcutaneous scar tissue but no erythema or edema of the soft tissues.  Calves are soft and pliable.  ?Skin: ?   General: Skin is warm and dry.  ?Neurological:  ?   Comments: Cranial nerves II through XII intact.  Upper motor strength 5\5.  Finger-nose exam normal bilaterally.  Patient has slightly increased difficulty elevating and holding the right lower extremity off the bed but can do so.  She also reports that doing so causes pain in her buttock and low back.  Lection extension strength intact.  Sensation intact light touch  ?Psychiatric:     ?   Mood and Affect: Mood normal.  ? ? ?ED Results / Procedures / Treatments   ?Labs ?(all labs ordered are listed, but only abnormal results are displayed) ?Labs Reviewed  ?APTT - Abnormal; Notable for the following components:  ?    Result Value  ? aPTT 38 (*)   ? All other components within normal limits  ?CBC - Abnormal; Notable for the following components:  ? WBC 3.8 (*)   ? RBC 3.02 (*)   ? Hemoglobin 9.9 (*)   ? HCT 32.8 (*)   ? MCV 108.6 (*)   ? RDW 16.0 (*)   ? Platelets 136 (*)   ? All other components within normal limits  ?DIFFERENTIAL - Abnormal; Notable  for the following components:  ? Lymphs Abs 0.6 (*)   ? All other components within normal limits  ?COMPREHENSIVE METABOLIC PANEL - Abnormal; Notable for the following components:  ? Glucose, Bld 130 (*)   ? BUN

## 2021-05-02 NOTE — ED Provider Triage Note (Signed)
Emergency Medicine Provider Triage Evaluation Note ? ?Jodi Bell , a 61 y.o. female  was evaluated in triage.  Pt complains of gradual onset, constant, right leg weakness that began around 10 AM yesterday.  Patient reports that she was leaving dialysis when she began experiencing the symptoms.  She reports issues with her back in the past and attributed to same.  She states that she continued to have weakness today went to urgent care who sent her here.  Apparently patient was hypoxic at urgent care however she typically wears 2 L oxygen which she was not wearing at that time.  They called EMS initially for CHF exacerbation.  Patient denies shortness of breath.  Satting 100% on 2 L currently.  She complains of a tingling sensation to her right fingers as well.  Denies any weakness to the right upper extremity.  No other complaints.. ? ?Review of Systems  ?Positive: + right leg weakness, right fingertip tingling ?Negative: - SOB ? ?Physical Exam  ?BP (!) 197/83 (BP Location: Left Arm)   Pulse 84   Temp 98.4 ?F (36.9 ?C) (Oral)   Resp 18   SpO2 100%  ?Gen:   Awake, no distress   ?Resp:  Normal effort  ?MSK:   Moves extremities without difficulty  ?Other:  Strength 4/5 to RLE. Sensation intact throughout. 2+ PT pulse. CN 2-12 intact. No facial droop. Normal finger to nose. No drift.  ? ?Medical Decision Making  ?Medically screening exam initiated at 3:03 PM.  Appropriate orders placed.  Jodi Bell was informed that the remainder of the evaluation will be completed by another provider, this initial triage assessment does not replace that evaluation, and the importance of remaining in the ED until their evaluation is complete. ? ?Out of stroke window.  ?  ?Eustaquio Maize, PA-C ?05/02/21 1506 ? ?

## 2021-05-03 MED ORDER — DEXAMETHASONE SODIUM PHOSPHATE 10 MG/ML IJ SOLN
10.0000 mg | Freq: Once | INTRAMUSCULAR | Status: AC
  Administered 2021-05-03: 10 mg via INTRAMUSCULAR
  Filled 2021-05-03: qty 1

## 2021-05-03 MED ORDER — PREDNISONE 20 MG PO TABS: ORAL_TABLET | ORAL | 0 refills | Status: DC

## 2021-05-03 MED ORDER — HYDROMORPHONE HCL 1 MG/ML IJ SOLN
1.0000 mg | Freq: Once | INTRAMUSCULAR | Status: AC
  Administered 2021-05-03: 1 mg via INTRAMUSCULAR
  Filled 2021-05-03: qty 1

## 2021-05-03 NOTE — Discharge Instructions (Addendum)
1.  You have significant degenerative disease in your back and spinal stenosis.  You have been given a dose of Decadron (steroid) in the emergency department.  You are to take a small burst of additional prednisone to start 2 days after your discharge.  At this time your back pain appears to be chronic.  It is important you continue to work with your back specialist and neurosurgeon for pain control and further management. ?2.  You had an MRI of the brain.  No signs of any stroke today. ?

## 2021-05-13 NOTE — Progress Notes (Signed)
?Cardiology Office Note:   ? ?Date:  05/14/2021  ? ?ID:  Jodi Bell, DOB 1960-12-06, MRN [id number] ? ?PCP:  [name]., MD ?  ?[facility] Providers ?Cardiologist:  Candee Furbish, MD    ? ?Referring MD: [name]., MD  ? ?Chief Complaint: follow-up CAD ? ?History of Present Illness:   ? ?Jodi Bell is a 61 y.o. female with a hx of CAD s/p CABG x 2, hypertension, ESRD on dialysis, mitral stenosis, mitral regurgitation, aortic stenosis, PAD, sleep apnea, PAF, former tobacco abuse, COPD, and diabetes.  ? ?Presented to [facility] ED 09/08/20 with SOB.  In the ED, she was found to be in severe respiratory distress, hypertensive with SBP in the 180s to 190s and tachypneic. CXR showed stable cardiomegaly, persistent but improving pulmonary edema, persistent small left pleural effusion, and atelectasis. CT abdomen showed bilateral pleural effusion. Hs troponin was 40 ? 1499 and she ruled in for NSTEMI.  She underwent cardiac catheterization on 09/10/2020 that revealed 80% stenosis of the proximal and mid LAD as well as up to 90% stenosis of mid RCA.  Echo revealed LVEF 60%, small pericardial effusion, mild AS, mild MR, and severe mitral annular calcification.  Of note prior echo in May 2022 also showed mild AAS, mild to moderate MR and MS. patient was referred to CT surgery and CABG was performed 09/13/2020 by Dr. Cyndia Bent. Postop course complicated by mental status changes and seizure-like activity, GIB with hgb drop to 8.3 with endoscopy that revealed non bleeding gastric ulcer with visible vessel that was clippped, Dieullafoy lesion of stomach x 2, atrial fibrillation started on amiodarone ? ?She was last seen in our office on 01/10/2021 by Dr. Marlou Porch.  Amiodarone was stopped in the setting of no further episodes of atrial fibrillation. She was referred to cardiac rehab and advised to return in 3-4 months.  ? ?Multiple recent ED visits for hypoxia due to missed dialysis 12/2020, COVID-19 infection 02/2021, back and leg  pain 04/2021.  ? ?Today, she is here along and is ambulating with a cane.  Has home oxygen but was unable to transport due to no small tank.  Oxygen saturations were initially low on room air but quickly improved with rest and as patient was placed on 2 L O2 by nasal cannula.  Reports she has many challenges but no specific concerns from a cardiac perspective. She denies chest pain, shortness of breath, lower extremity edema, palpitations, hematuria, diaphoresis, weakness, presyncope, syncope, orthopnea, and PND.  States blood pressure is usually elevated on dialysis days.  There have been no concerns reported to her from dialysis team. Had appointment for sleep study last week that she had to cancel.  She plans to reschedule.  She had consultation with cardiac rehab but had right leg numbness during second visit that was concerning and was taken to the ED by CR staff.  Was referred for pain management of sciatica in addition to chronic back pain.  Plans to return to cardiac rehab as soon as she is able.  Reports she occasionally smells blood in her stool. Lives in an a basement apartment at her daughter's house and sometimes is in too much pain to go upstairs to full kitchen.  Has just a refrigerator downstairs.  She would like to meet with a nutritionist because her protein level is low.  ? ?Past Medical History:  ?Diagnosis Date  ? Anemia of chronic disease   ? Anxiety   ? Arthritis   ?  oa and psoriatic ra  ? Asthma   ? Back pain, chronic   ? lower back  ? Candida infection finished tx 2 days ago  ? Chronic insomnia   ? COPD (chronic obstructive pulmonary disease) ([place])   ? Coronary artery calcification seen on CT scan   ? Diabetes mellitus   ? type 2  ? Eczema   ? ESRD on dialysis [facility])   ? Essential hypertension   ? GERD (gastroesophageal reflux disease)   ? Gout   ? History of hiatal hernia   ? Hypoalbuminemia   ? Hyponatremia   ? Mild aortic stenosis   ? Mitral regurgitation   ? Mitral stenosis   ? Obesity   ?  PAD (peripheral artery disease) ([place])   ? a. externial iliac calcification seen on CT 04/2020.  ? Pneumonia few yrs ago x 2  ? Sleep apnea   ? Tobacco abuse   ? Upper GI bleed 05/2019  ? ? ?Past Surgical History:  ?Procedure Laterality Date  ? BACK SURGERY  2016  ? lower back fusion with cage  ? BIOPSY  09/23/2017  ? Procedure: BIOPSY;  Surgeon: Wonda Horner, MD;  Location: [facility] ENDOSCOPY;  Service: Endoscopy;;  ? BIOPSY  05/19/2019  ? Procedure: BIOPSY;  Surgeon: Otis Brace, MD;  Location: [place] ENDOSCOPY;  Service: Gastroenterology;;  ? [place]  ? x 1   ? COLONOSCOPY WITH PROPOFOL N/A 09/23/2017  ? Procedure: COLONOSCOPY WITH PROPOFOL Hemostatic clips placed;  Surgeon: Wonda Horner, MD;  Location: [facility]OS[facility];  Service: Endoscopy;  Laterality: N/A;  ? COLONOSCOPY WITH PROPOFOL N/A 10/02/2020  ? Procedure: COLONOSCOPY WITH PROPOFOL;  Surgeon: Arta Silence, MD;  Location: [place];  Service: Endoscopy;  Laterality: N/A;  ? CORONARY ARTERY BYPASS GRAFT N/A 09/13/2020  ? Procedure: CORONARY ARTERY BYPASS GRAFTING (CABG), ON PUMP, TIMES TWO, USING LEFT INTERNAL MAMMARY ARTERY AND RIGHT ENDOSCOPICALLY HARVESTED GREATER SAPHENOUS VEIN;  Surgeon: Gaye Pollack, MD;  Location: [place];  Service: Open Heart Surgery;  Laterality: N/A;  ? [place] OF UTERUS  1988  ? ESOPHAGOGASTRODUODENOSCOPY N/A 09/22/2020  ? Procedure: ESOPHAGOGASTRODUODENOSCOPY (EGD);  Surgeon: Arta Silence, MD;  [facility];  Service: Endoscopy;  Laterality: N/A;  ? ESOPHAGOGASTRODUODENOSCOPY (EGD) WITH PROPOFOL N/A 09/23/2017  ? Procedure: ESOPHAGOGASTRODUODENOSCOPY (EGD) WITH PROPOFOL;  Surgeon: Wonda Horner, MD;  Location: [facility];  Service: Endoscopy;  Laterality: N/A;  ? ESOPHAGOGASTRODUODENOSCOPY (EGD) WITH PROPOFOL N/A 05/19/2019  ? Procedure: ESOPHAGOGASTRODUODENOSCOPY (EGD) WITH PROPOFOL;  Surgeon: Otis Brace, MD;  Location: [facility] ENDOSCOPY;  Service: Gastroenterology;  Laterality: N/A;  ?  ESOPHAGOGASTRODUODENOSCOPY (EGD) WITH PROPOFOL N/A 10/02/2020  ? Procedure: ESOPHAGOGASTRODUODENOSCOPY (EGD) WITH PROPOFOL;  Surgeon: Arta Silence, MD;  Location: [facility] ENDOSCOPY;  Service: Endoscopy;  Laterality: N/A;  ? GIVENS CAPSULE STUDY N/A 05/19/2019  ? Procedure: GIVENS CAPSULE STUDY;  Surgeon: Otis Brace, MD;  Location: [place];  Service: Gastroenterology;  Laterality: N/A;  ? HEMOSTASIS CLIP PLACEMENT  09/22/2020  ? Procedure: HEMOSTASIS CLIP PLACEMENT;  Surgeon: Arta Silence, MD;  Location: [place];  Service: Endoscopy;;  ? HEMOSTASIS CONTROL  09/22/2020  ? Procedure: HEMOSTASIS CONTROL;  Surgeon: Arta Silence, MD;  Location: [place];  Service: Endoscopy;;  ? HOT HEMOSTASIS N/A 05/19/2019  ? Procedure: HOT HEMOSTASIS (ARGON PLASMA COAGULATION/BICAP);  Surgeon: Otis Brace, MD;  Location[facility] ENDOSCOPY;  Service: Gastroenterology;  Laterality: N/A;  ? LEFT HEART CATH AND CORONARY ANGIOGRAPHY N/A 09/10/2020  ? Procedure: LEFT HEART CATH AND  CORONARY ANGIOGRAPHY;  Surgeon: Nelva Bush, MD;  Location: [place] CV LAB;  Service: Cardiovascular;  Laterality: N/A;  ? PARTIAL KNEE ARTHROPLASTY Left 11/12/2017  ? Procedure: left unicompartmental arthroplasty-medial;  Surgeon: Paralee Cancel, MD;  Location: [facility];  Service: Orthopedics;  Laterality: Left;  75min  ? SUBMUCOSAL INJECTION  09/23/2017  ? Procedure: SUBMUCOSAL INJECTION;  Surgeon: Wonda Horner, MD;  Location: [facility] ENDOS[facility];  Service: Endoscopy;;  in colon  ? TEE WITHOUT CARDIOVERSION N/A 09/13/2020  ? Procedure: TRANSESOPHAGEAL ECHOCARDIOGRAM (TEE);  Surgeon: Gaye Pollack, MD;  Location: [place];  Service: Open Heart Surgery;  Laterality: N/A;  ? TUBAL LIGATION  1990  ? ? ?Current Medications: ?Current Meds  ?Medication Sig  ? acetaminophen (TYLENOL) 500 MG tablet Take 1,000 mg by mouth every 6 (six) hours as needed for mild pain.  ? allopurinol (ZYLOPRIM) 100 MG tablet Take 100 mg by mouth every evening.  ? amLODipine  (NORVASC) 10 MG tablet Take 10 mg by mouth daily.  ? carboxymethylcellulose 1 % ophthalmic solution Place 1 drop into both eyes as needed (dry eyes).  ? cholecalciferol (VITAMIN D) 25 MCG tablet Take 1 tabl

## 2021-05-14 ENCOUNTER — Encounter: Payer: Self-pay | Admitting: Nurse Practitioner

## 2021-05-14 ENCOUNTER — Ambulatory Visit: Payer: Medicare PPO | Admitting: Nurse Practitioner

## 2021-05-14 VITALS — BP 130/68 | HR 80 | Ht 62.0 in | Wt 145.0 lb

## 2021-05-14 DIAGNOSIS — Z9981 Dependence on supplemental oxygen: Secondary | ICD-10-CM

## 2021-05-14 DIAGNOSIS — Z951 Presence of aortocoronary bypass graft: Secondary | ICD-10-CM

## 2021-05-14 DIAGNOSIS — E1122 Type 2 diabetes mellitus with diabetic chronic kidney disease: Secondary | ICD-10-CM

## 2021-05-14 DIAGNOSIS — I2510 Atherosclerotic heart disease of native coronary artery without angina pectoris: Secondary | ICD-10-CM

## 2021-05-14 DIAGNOSIS — N185 Chronic kidney disease, stage 5: Secondary | ICD-10-CM

## 2021-05-14 DIAGNOSIS — I050 Rheumatic mitral stenosis: Secondary | ICD-10-CM

## 2021-05-14 DIAGNOSIS — I350 Nonrheumatic aortic (valve) stenosis: Secondary | ICD-10-CM

## 2021-05-14 NOTE — Patient Instructions (Signed)
Medication Instructions:  ? ?Your physician recommends that you continue on your current medications as directed. Please refer to the Current Medication list given to you today. ? ? ?*If you need a refill on your cardiac medications before your next appointment, please call your pharmacy* ? ? ?Follow-Up: ?At [facility], you and your health needs are our priority.  As part of our continuing mission to provide you with exceptional heart care, we have created designated Provider Care Teams.  These Care Teams include your primary Cardiologist (physician) and Advanced Practice Providers (APPs -  Physician Assistants and Nurse Practitioners) who all work together to provide you with the care you need, when you need it. ? ?We recommend signing up for the patient portal called "MyChart".  Sign up information is provided on this After Visit Summary.  MyChart is used to connect with patients for Virtual Visits (Telemedicine).  Patients are able to view lab/test results, encounter notes, upcoming appointments, etc.  Non-urgent messages can be sent to your provider as well.   ?To learn more about what you can do with MyChart, go to [email or web address].   ? ?Your next appointment:   ?4 month(s) ? ?The format for your next appointment:   ?In Person ? ?Provider:   ?Candee Furbish, MD   ? ? ?

## 2021-06-26 ENCOUNTER — Inpatient Hospital Stay (HOSPITAL_COMMUNITY): Payer: Medicare PPO

## 2021-06-26 ENCOUNTER — Emergency Department (HOSPITAL_COMMUNITY): Payer: Medicare PPO

## 2021-06-26 ENCOUNTER — Other Ambulatory Visit: Payer: Self-pay

## 2021-06-26 ENCOUNTER — Encounter (HOSPITAL_COMMUNITY): Payer: Self-pay

## 2021-06-26 ENCOUNTER — Inpatient Hospital Stay (HOSPITAL_COMMUNITY)
Admission: EM | Admit: 2021-06-26 | Discharge: 2021-06-29 | DRG: 640 | Disposition: A | Discharge status: Home / Self Care | Payer: Medicare PPO | Attending: Internal Medicine | Admitting: Internal Medicine

## 2021-06-26 DIAGNOSIS — I083 Combined rheumatic disorders of mitral, aortic and tricuspid valves: Secondary | ICD-10-CM | POA: Diagnosis present

## 2021-06-26 DIAGNOSIS — G8929 Other chronic pain: Secondary | ICD-10-CM | POA: Diagnosis present

## 2021-06-26 DIAGNOSIS — I2510 Atherosclerotic heart disease of native coronary artery without angina pectoris: Secondary | ICD-10-CM | POA: Diagnosis present

## 2021-06-26 DIAGNOSIS — Z79899 Other long term (current) drug therapy: Secondary | ICD-10-CM

## 2021-06-26 DIAGNOSIS — J441 Chronic obstructive pulmonary disease with (acute) exacerbation: Secondary | ICD-10-CM | POA: Diagnosis present

## 2021-06-26 DIAGNOSIS — B970 Adenovirus as the cause of diseases classified elsewhere: Secondary | ICD-10-CM | POA: Diagnosis present

## 2021-06-26 DIAGNOSIS — Z87891 Personal history of nicotine dependence: Secondary | ICD-10-CM | POA: Diagnosis not present

## 2021-06-26 DIAGNOSIS — I120 Hypertensive chronic kidney disease with stage 5 chronic kidney disease or end stage renal disease: Secondary | ICD-10-CM | POA: Diagnosis present

## 2021-06-26 DIAGNOSIS — E669 Obesity, unspecified: Secondary | ICD-10-CM | POA: Diagnosis present

## 2021-06-26 DIAGNOSIS — J440 Chronic obstructive pulmonary disease with acute lower respiratory infection: Secondary | ICD-10-CM | POA: Diagnosis present

## 2021-06-26 DIAGNOSIS — E1122 Type 2 diabetes mellitus with diabetic chronic kidney disease: Secondary | ICD-10-CM | POA: Diagnosis present

## 2021-06-26 DIAGNOSIS — Z888 Allergy status to other drugs, medicaments and biological substances status: Secondary | ICD-10-CM

## 2021-06-26 DIAGNOSIS — B340 Adenovirus infection, unspecified: Secondary | ICD-10-CM

## 2021-06-26 DIAGNOSIS — N2581 Secondary hyperparathyroidism of renal origin: Secondary | ICD-10-CM | POA: Diagnosis present

## 2021-06-26 DIAGNOSIS — K219 Gastro-esophageal reflux disease without esophagitis: Secondary | ICD-10-CM | POA: Diagnosis present

## 2021-06-26 DIAGNOSIS — R0609 Other forms of dyspnea: Secondary | ICD-10-CM

## 2021-06-26 DIAGNOSIS — G4700 Insomnia, unspecified: Secondary | ICD-10-CM | POA: Diagnosis present

## 2021-06-26 DIAGNOSIS — Z87892 Personal history of anaphylaxis: Secondary | ICD-10-CM

## 2021-06-26 DIAGNOSIS — Z833 Family history of diabetes mellitus: Secondary | ICD-10-CM

## 2021-06-26 DIAGNOSIS — Z951 Presence of aortocoronary bypass graft: Secondary | ICD-10-CM

## 2021-06-26 DIAGNOSIS — Z91018 Allergy to other foods: Secondary | ICD-10-CM

## 2021-06-26 DIAGNOSIS — I10 Essential (primary) hypertension: Secondary | ICD-10-CM | POA: Diagnosis present

## 2021-06-26 DIAGNOSIS — N186 End stage renal disease: Secondary | ICD-10-CM | POA: Diagnosis present

## 2021-06-26 DIAGNOSIS — Z9981 Dependence on supplemental oxygen: Secondary | ICD-10-CM

## 2021-06-26 DIAGNOSIS — J449 Chronic obstructive pulmonary disease, unspecified: Secondary | ICD-10-CM

## 2021-06-26 DIAGNOSIS — D631 Anemia in chronic kidney disease: Secondary | ICD-10-CM | POA: Diagnosis present

## 2021-06-26 DIAGNOSIS — J811 Chronic pulmonary edema: Secondary | ICD-10-CM | POA: Diagnosis present

## 2021-06-26 DIAGNOSIS — Z96652 Presence of left artificial knee joint: Secondary | ICD-10-CM | POA: Diagnosis present

## 2021-06-26 DIAGNOSIS — Z981 Arthrodesis status: Secondary | ICD-10-CM

## 2021-06-26 DIAGNOSIS — Z8701 Personal history of pneumonia (recurrent): Secondary | ICD-10-CM

## 2021-06-26 DIAGNOSIS — E1151 Type 2 diabetes mellitus with diabetic peripheral angiopathy without gangrene: Secondary | ICD-10-CM | POA: Diagnosis present

## 2021-06-26 DIAGNOSIS — M1990 Unspecified osteoarthritis, unspecified site: Secondary | ICD-10-CM | POA: Diagnosis present

## 2021-06-26 DIAGNOSIS — Z8249 Family history of ischemic heart disease and other diseases of the circulatory system: Secondary | ICD-10-CM

## 2021-06-26 DIAGNOSIS — E8770 Fluid overload, unspecified: Secondary | ICD-10-CM | POA: Diagnosis present

## 2021-06-26 DIAGNOSIS — M549 Dorsalgia, unspecified: Secondary | ICD-10-CM | POA: Diagnosis present

## 2021-06-26 DIAGNOSIS — E11649 Type 2 diabetes mellitus with hypoglycemia without coma: Secondary | ICD-10-CM | POA: Diagnosis not present

## 2021-06-26 DIAGNOSIS — E8779 Other fluid overload: Secondary | ICD-10-CM | POA: Diagnosis not present

## 2021-06-26 DIAGNOSIS — L4059 Other psoriatic arthropathy: Secondary | ICD-10-CM | POA: Diagnosis present

## 2021-06-26 DIAGNOSIS — F419 Anxiety disorder, unspecified: Secondary | ICD-10-CM | POA: Diagnosis present

## 2021-06-26 DIAGNOSIS — Z9851 Tubal ligation status: Secondary | ICD-10-CM

## 2021-06-26 DIAGNOSIS — Z20822 Contact with and (suspected) exposure to covid-19: Secondary | ICD-10-CM | POA: Diagnosis present

## 2021-06-26 DIAGNOSIS — J9611 Chronic respiratory failure with hypoxia: Secondary | ICD-10-CM | POA: Diagnosis present

## 2021-06-26 DIAGNOSIS — F5104 Psychophysiologic insomnia: Secondary | ICD-10-CM | POA: Diagnosis present

## 2021-06-26 DIAGNOSIS — M109 Gout, unspecified: Secondary | ICD-10-CM | POA: Diagnosis present

## 2021-06-26 DIAGNOSIS — Z7951 Long term (current) use of inhaled steroids: Secondary | ICD-10-CM

## 2021-06-26 DIAGNOSIS — Z992 Dependence on renal dialysis: Secondary | ICD-10-CM

## 2021-06-26 DIAGNOSIS — G4733 Obstructive sleep apnea (adult) (pediatric): Secondary | ICD-10-CM | POA: Diagnosis present

## 2021-06-26 LAB — CBC
HCT: 31.7 % — ABNORMAL LOW (ref 36.0–46.0)
Hemoglobin: 9.9 g/dL — ABNORMAL LOW (ref 12.0–15.0)
MCH: 32 pg (ref 26.0–34.0)
MCHC: 31.2 g/dL (ref 30.0–36.0)
MCV: 102.6 fL — ABNORMAL HIGH (ref 80.0–100.0)
Platelets: 158 10*3/uL (ref 150–400)
RBC: 3.09 MIL/uL — ABNORMAL LOW (ref 3.87–5.11)
RDW: 14.9 % (ref 11.5–15.5)
WBC: 10.6 10*3/uL — ABNORMAL HIGH (ref 4.0–10.5)
nRBC: 0 % (ref 0.0–0.2)

## 2021-06-26 LAB — COMPREHENSIVE METABOLIC PANEL
ALT: 12 U/L (ref 0–44)
AST: 18 U/L (ref 15–41)
Albumin: 3.1 g/dL — ABNORMAL LOW (ref 3.5–5.0)
Alkaline Phosphatase: 206 U/L — ABNORMAL HIGH (ref 38–126)
Anion gap: 14 (ref 5–15)
BUN: 39 mg/dL — ABNORMAL HIGH (ref 8–23)
CO2: 23 mmol/L (ref 22–32)
Calcium: 7.6 mg/dL — ABNORMAL LOW (ref 8.9–10.3)
Chloride: 98 mmol/L (ref 98–111)
Creatinine, Ser: 7.94 mg/dL — ABNORMAL HIGH (ref 0.44–1.00)
GFR, Estimated: 5 mL/min — ABNORMAL LOW (ref >60)
Glucose, Bld: 88 mg/dL (ref 70–99)
Potassium: 4.5 mmol/L (ref 3.5–5.1)
Sodium: 135 mmol/L (ref 135–145)
Total Bilirubin: 0.5 mg/dL (ref 0.3–1.2)
Total Protein: 6.4 g/dL — ABNORMAL LOW (ref 6.5–8.1)

## 2021-06-26 LAB — RESP PANEL BY RT-PCR (FLU A&B, COVID) ARPGX2
Influenza A by PCR: NEGATIVE
Influenza B by PCR: NEGATIVE
SARS Coronavirus 2 by RT PCR: NEGATIVE

## 2021-06-26 LAB — HEPATITIS B SURFACE ANTIGEN: Hepatitis B Surface Ag: NONREACTIVE

## 2021-06-26 LAB — CBC WITH DIFFERENTIAL/PLATELET
Abs Immature Granulocytes: 0.03 10*3/uL (ref 0.00–0.07)
Basophils Absolute: 0 10*3/uL (ref 0.0–0.1)
Basophils Relative: 0 %
Eosinophils Absolute: 0.1 10*3/uL (ref 0.0–0.5)
Eosinophils Relative: 2 %
HCT: 30.2 % — ABNORMAL LOW (ref 36.0–46.0)
Hemoglobin: 9.1 g/dL — ABNORMAL LOW (ref 12.0–15.0)
Immature Granulocytes: 1 %
Lymphocytes Relative: 9 %
Lymphs Abs: 0.5 10*3/uL — ABNORMAL LOW (ref 0.7–4.0)
MCH: 31.8 pg (ref 26.0–34.0)
MCHC: 30.1 g/dL (ref 30.0–36.0)
MCV: 105.6 fL — ABNORMAL HIGH (ref 80.0–100.0)
Monocytes Absolute: 0.4 10*3/uL (ref 0.1–1.0)
Monocytes Relative: 7 %
Neutro Abs: 4.6 10*3/uL (ref 1.7–7.7)
Neutrophils Relative %: 81 %
Platelets: 182 10*3/uL (ref 150–400)
RBC: 2.86 MIL/uL — ABNORMAL LOW (ref 3.87–5.11)
RDW: 15 % (ref 11.5–15.5)
WBC: 5.8 10*3/uL (ref 4.0–10.5)
nRBC: 0 % (ref 0.0–0.2)

## 2021-06-26 LAB — ECHOCARDIOGRAM COMPLETE
AR max vel: 1.28 cm2
AV Area VTI: 1.27 cm2
AV Area mean vel: 1.25 cm2
AV Mean grad: 41.3 mmHg
AV Peak grad: 73.2 mmHg
Ao pk vel: 4.28 m/s
Area-P 1/2: 2.73 cm2
Calc EF: 69 %
Height: 62 in
Radius: 0.5 cm
S' Lateral: 2.08 cm
Single Plane A2C EF: 72.1 %
Single Plane A4C EF: 64 %
Weight: 2321 oz

## 2021-06-26 LAB — APTT: aPTT: 41 seconds — ABNORMAL HIGH (ref 24–36)

## 2021-06-26 LAB — PROTIME-INR
INR: 1.4 — ABNORMAL HIGH (ref 0.8–1.2)
Prothrombin Time: 17.3 seconds — ABNORMAL HIGH (ref 11.4–15.2)

## 2021-06-26 LAB — CBG MONITORING, ED: Glucose-Capillary: 75 mg/dL (ref 70–99)

## 2021-06-26 LAB — GLUCOSE, CAPILLARY: Glucose-Capillary: 67 mg/dL — ABNORMAL LOW (ref 70–99)

## 2021-06-26 LAB — LACTIC ACID, PLASMA: Lactic Acid, Venous: 0.6 mmol/L (ref 0.5–1.9)

## 2021-06-26 MED ORDER — ACETAMINOPHEN 500 MG PO TABS
1000.0000 mg | ORAL_TABLET | Freq: Four times a day (QID) | ORAL | Status: DC | PRN
  Administered 2021-06-27: 1000 mg via ORAL
  Filled 2021-06-26: qty 2

## 2021-06-26 MED ORDER — RENA-VITE PO TABS
1.0000 | ORAL_TABLET | Freq: Every day | ORAL | Status: DC
  Administered 2021-06-26 – 2021-06-28 (×3): 1 via ORAL
  Filled 2021-06-26 (×3): qty 1

## 2021-06-26 MED ORDER — PENTAFLUOROPROP-TETRAFLUOROETH EX AERO: 1.0000 "application " | INHALATION_SPRAY | CUTANEOUS | Status: DC | PRN

## 2021-06-26 MED ORDER — MONTELUKAST SODIUM 10 MG PO TABS
10.0000 mg | ORAL_TABLET | Freq: Every day | ORAL | Status: DC
  Administered 2021-06-26 – 2021-06-28 (×3): 10 mg via ORAL
  Filled 2021-06-26 (×3): qty 1

## 2021-06-26 MED ORDER — ISOSORBIDE MONONITRATE ER 30 MG PO TB24
30.0000 mg | ORAL_TABLET | Freq: Every day | ORAL | Status: DC
  Administered 2021-06-27 – 2021-06-29 (×3): 30 mg via ORAL
  Filled 2021-06-26 (×3): qty 1

## 2021-06-26 MED ORDER — INSULIN ASPART 100 UNIT/ML IJ SOLN: 0.0000 [IU] | Freq: Three times a day (TID) | INTRAMUSCULAR | Status: DC

## 2021-06-26 MED ORDER — HEPARIN SODIUM (PORCINE) 1000 UNIT/ML DIALYSIS: 1000.0000 [IU] | INTRAMUSCULAR | Status: DC | PRN

## 2021-06-26 MED ORDER — OXYCODONE HCL 5 MG PO TABS
10.0000 mg | ORAL_TABLET | Freq: Two times a day (BID) | ORAL | Status: DC | PRN
  Administered 2021-06-27: 10 mg via ORAL
  Filled 2021-06-26: qty 2

## 2021-06-26 MED ORDER — ACETAMINOPHEN 325 MG PO TABS
650.0000 mg | ORAL_TABLET | Freq: Once | ORAL | Status: AC
  Administered 2021-06-26: 650 mg via ORAL
  Filled 2021-06-26: qty 2

## 2021-06-26 MED ORDER — CLONIDINE HCL 0.1 MG PO TABS
0.1000 mg | ORAL_TABLET | Freq: Three times a day (TID) | ORAL | Status: DC
  Administered 2021-06-26 – 2021-06-29 (×9): 0.1 mg via ORAL
  Filled 2021-06-26 (×9): qty 1

## 2021-06-26 MED ORDER — CHLORHEXIDINE GLUCONATE CLOTH 2 % EX PADS
6.0000 | MEDICATED_PAD | Freq: Every day | CUTANEOUS | Status: DC
  Administered 2021-06-27 – 2021-06-28 (×2): 6 via TOPICAL

## 2021-06-26 MED ORDER — TIOTROPIUM BROMIDE MONOHYDRATE 2.5 MCG/ACT IN AERS: INHALATION_SPRAY | Freq: Every day | RESPIRATORY_TRACT | Status: DC

## 2021-06-26 MED ORDER — IPRATROPIUM-ALBUTEROL 0.5-2.5 (3) MG/3ML IN SOLN
3.0000 mL | Freq: Once | RESPIRATORY_TRACT | Status: AC
  Administered 2021-06-26: 3 mL via RESPIRATORY_TRACT
  Filled 2021-06-26: qty 3

## 2021-06-26 MED ORDER — LIDOCAINE HCL (PF) 1 % IJ SOLN: 5.0000 mL | INTRAMUSCULAR | Status: DC | PRN

## 2021-06-26 MED ORDER — UMECLIDINIUM BROMIDE 62.5 MCG/ACT IN AEPB
INHALATION_SPRAY | Freq: Every day | RESPIRATORY_TRACT | Status: DC
  Administered 2021-06-27 – 2021-06-29 (×2): 1 via RESPIRATORY_TRACT
  Filled 2021-06-26: qty 7

## 2021-06-26 MED ORDER — POLYVINYL ALCOHOL 1.4 % OP SOLN
1.0000 [drp] | OPHTHALMIC | Status: DC | PRN
  Filled 2021-06-26: qty 15

## 2021-06-26 MED ORDER — LORAZEPAM 1 MG PO TABS
1.0000 mg | ORAL_TABLET | Freq: Three times a day (TID) | ORAL | Status: DC | PRN
  Administered 2021-06-27 – 2021-06-28 (×2): 1 mg via ORAL
  Filled 2021-06-26 (×2): qty 1

## 2021-06-26 MED ORDER — AMLODIPINE BESYLATE 10 MG PO TABS
10.0000 mg | ORAL_TABLET | Freq: Every day | ORAL | Status: DC
  Administered 2021-06-27 – 2021-06-29 (×3): 10 mg via ORAL
  Filled 2021-06-26 (×3): qty 1

## 2021-06-26 MED ORDER — ACETAMINOPHEN 500 MG PO TABS
1000.0000 mg | ORAL_TABLET | Freq: Four times a day (QID) | ORAL | Status: DC | PRN
  Administered 2021-06-26: 1000 mg via ORAL
  Filled 2021-06-26: qty 2

## 2021-06-26 MED ORDER — SODIUM CHLORIDE 0.9 % IV SOLN: 100.0000 mL | INTRAVENOUS | Status: DC | PRN

## 2021-06-26 MED ORDER — IRBESARTAN 300 MG PO TABS
300.0000 mg | ORAL_TABLET | Freq: Every day | ORAL | Status: DC
  Administered 2021-06-27 – 2021-06-29 (×3): 300 mg via ORAL
  Filled 2021-06-26 (×4): qty 1

## 2021-06-26 MED ORDER — SENNOSIDES-DOCUSATE SODIUM 8.6-50 MG PO TABS: 1.0000 | ORAL_TABLET | Freq: Every evening | ORAL | Status: DC | PRN

## 2021-06-26 MED ORDER — CALCIUM ACETATE (PHOS BINDER) 667 MG PO CAPS
1334.0000 mg | ORAL_CAPSULE | Freq: Two times a day (BID) | ORAL | Status: DC
  Administered 2021-06-27 – 2021-06-28 (×3): 1334 mg via ORAL
  Filled 2021-06-26 (×6): qty 2

## 2021-06-26 MED ORDER — ZOLPIDEM TARTRATE 5 MG PO TABS
5.0000 mg | ORAL_TABLET | Freq: Every evening | ORAL | Status: DC | PRN
  Administered 2021-06-27 – 2021-06-28 (×2): 5 mg via ORAL
  Filled 2021-06-26 (×2): qty 1

## 2021-06-26 MED ORDER — HEPARIN SODIUM (PORCINE) 5000 UNIT/ML IJ SOLN
5000.0000 [IU] | Freq: Three times a day (TID) | INTRAMUSCULAR | Status: DC
  Administered 2021-06-26 – 2021-06-29 (×7): 5000 [IU] via SUBCUTANEOUS
  Filled 2021-06-26 (×8): qty 1

## 2021-06-26 MED ORDER — CARBOXYMETHYLCELLULOSE SODIUM 1 % OP SOLN: 1.0000 [drp] | OPHTHALMIC | Status: DC | PRN

## 2021-06-26 MED ORDER — ALTEPLASE 2 MG IJ SOLR: 2.0000 mg | Freq: Once | INTRAMUSCULAR | Status: DC | PRN

## 2021-06-26 MED ORDER — PANTOPRAZOLE SODIUM 40 MG PO TBEC
40.0000 mg | DELAYED_RELEASE_TABLET | Freq: Every day | ORAL | Status: DC
  Administered 2021-06-26 – 2021-06-29 (×4): 40 mg via ORAL
  Filled 2021-06-26 (×4): qty 1

## 2021-06-26 MED ORDER — IPRATROPIUM-ALBUTEROL 0.5-2.5 (3) MG/3ML IN SOLN
3.0000 mL | RESPIRATORY_TRACT | Status: AC
  Filled 2021-06-26 (×2): qty 3

## 2021-06-26 MED ORDER — ALBUTEROL SULFATE (2.5 MG/3ML) 0.083% IN NEBU
2.5000 mg | INHALATION_SOLUTION | Freq: Four times a day (QID) | RESPIRATORY_TRACT | Status: DC | PRN
  Administered 2021-06-27: 2.5 mg via RESPIRATORY_TRACT
  Filled 2021-06-26: qty 3

## 2021-06-26 MED ORDER — ALBUTEROL SULFATE HFA 108 (90 BASE) MCG/ACT IN AERS: 1.0000 | INHALATION_SPRAY | Freq: Four times a day (QID) | RESPIRATORY_TRACT | Status: DC | PRN

## 2021-06-26 MED ORDER — LIDOCAINE-PRILOCAINE 2.5-2.5 % EX CREA: 1.0000 "application " | TOPICAL_CREAM | CUTANEOUS | Status: DC | PRN

## 2021-06-26 MED ORDER — RISAQUAD PO CAPS
1.0000 | ORAL_CAPSULE | Freq: Every day | ORAL | Status: DC
  Administered 2021-06-27 – 2021-06-29 (×3): 1 via ORAL
  Filled 2021-06-26 (×3): qty 1

## 2021-06-26 MED ORDER — AMLODIPINE BESYLATE 5 MG PO TABS
10.0000 mg | ORAL_TABLET | Freq: Once | ORAL | Status: AC
  Administered 2021-06-26: 10 mg via ORAL
  Filled 2021-06-26: qty 2

## 2021-06-26 MED ORDER — CINACALCET HCL 30 MG PO TABS
30.0000 mg | ORAL_TABLET | Freq: Every day | ORAL | Status: DC
  Administered 2021-06-27 – 2021-06-29 (×3): 30 mg via ORAL
  Filled 2021-06-26 (×3): qty 1

## 2021-06-26 MED ORDER — ALLOPURINOL 100 MG PO TABS
100.0000 mg | ORAL_TABLET | Freq: Every evening | ORAL | Status: DC
Start: 2021-06-26 — End: 2021-06-29
  Administered 2021-06-26 – 2021-06-28 (×3): 100 mg via ORAL
  Filled 2021-06-26 (×4): qty 1

## 2021-06-26 NOTE — H&P (Signed)
Date: 06/26/2021               Patient Name:  Jodi Bell MRN: [id number]  DOB: 02-28-1960 Age / Sex: 61 y.o., female   PCP: [name]., MD         Medical Service: Internal Medicine Teaching Service         Attending Physician: Dr. Lottie Mussel, MD    First Contact: Idamae Schuller MD Pager: Teodora Medici [phone]  Second Contact: Linwood Dibbles, MD Pager: PA [phone]       After Hours (After 5p/  First Contact Pager: [phone]  weekends / holidays): Second Contact Pager: [phone]   SUBJECTIVE   Chief Complaint: Shortness of breath , sore throat, fever  History of Present Illness: Jodi Bell is a 61 year old women with HTN, T2DM (diet controlled), COPD on 3 L supplemental oxygen at baseline , and ESRD on HD MWF who presents with shortness of breath.  Patient presented to her primary care physician 5/15 for scratchy throat and fever.  She started having the symptoms approximately 5/11.  Her grandchild had strep throat a week and a half before she began to have symptoms.  She has continued to have symptoms. She was prescribed doxycycline.  She had a cough this morning, but could not cough anything up. She was also having shortness of breath. She stays with daughter and son in law. Her daughter is out of town and son in law went to work. She came to hospital because she was scared her shortness of breath was worsening and she was home by herself.  Review of Systems  Constitutional:  Positive for fever. Negative for chills.  HENT:  Positive for sore throat. Negative for sinus pain.   Eyes:  Negative for pain and redness.  Respiratory:  Positive for shortness of breath and wheezing. Negative for cough.   Gastrointestinal:  Negative for abdominal pain and diarrhea.  Genitourinary:  Negative for hematuria and urgency.  Musculoskeletal:  Positive for back pain (chronic) and falls (69months ago).  Neurological:  Positive for sensory change (numbess in right finger tips). Negative for dizziness.        Meds: Medications below on list, patient unable to give complete record herself.  Current Meds  Medication Sig   allopurinol (ZYLOPRIM) 100 MG tablet Take 100 mg by mouth every evening.   amLODipine (NORVASC) 10 MG tablet Take 10 mg by mouth daily.   cloNIDine (CATAPRES) 0.1 MG tablet Take 0.1 mg by mouth 3 (three) times daily.   cyclobenzaprine (FLEXERIL) 10 MG tablet Take 10 mg by mouth 2 (two) times daily as needed for muscle spasms.   Darbepoetin Alfa (ARANESP) 200 MCG/0.4ML SOSY injection Inject 0.4 mLs (200 mcg total) into the vein every Wednesday with hemodialysis.   Diclofenac Sodium (VOLTAREN EX) Apply 1 application. topically daily as needed (pain).   doxycycline (VIBRAMYCIN) 100 MG capsule Take 100 mg by mouth 2 (two) times daily.   gabapentin (NEURONTIN) 300 MG capsule Take 1 capsule (300 mg total) by mouth at bedtime as needed (Neuropathic pain). (Patient taking differently: Take 300 mg by mouth daily as needed (Neuropathic pain).)   heparin 1000 unit/mL SOLN injection 1 mL (1,000 Units total) by Dialysis route as needed (in dialysis).   hydrALAZINE (APRESOLINE) 100 MG tablet Take 1 tablet (100 mg total) by mouth every 8 (eight) hours. (Patient taking differently: Take 100 mg by mouth 3 (three) times daily.)   hydrocortisone (ANUSOL-HC) 25 MG suppository Place 25  mg rectally daily as needed for hemorrhoids.   lidocaine-prilocaine (EMLA) cream Apply 1 application. topically 2 (two) times daily as needed (pain in fingers).   LORazepam (ATIVAN) 1 MG tablet Take 1 mg by mouth every 8 (eight) hours as needed for anxiety.   montelukast (SINGULAIR) 10 MG tablet Take 10 mg by mouth daily with supper.    olmesartan (BENICAR) 40 MG tablet Take 40 mg by mouth every evening.   ondansetron (ZOFRAN) 4 MG tablet Take 4 mg by mouth 2 (two) times daily as needed for nausea or vomiting.   pantoprazole (PROTONIX) 40 MG tablet Take 40 mg by mouth daily.   predniSONE (DELTASONE) 5 MG tablet  Take 1 tablet (5 mg total) by mouth daily with breakfast.   sorbitol 70 % solution Take 30 mLs by mouth See admin instructions. Take 30 ml one- two times daily as needed for constipation   Tiotropium Bromide Monohydrate (SPIRIVA RESPIMAT IN) Inhale 2 puffs into the lungs 2 (two) times daily as needed (wheezing/SOB).   VENTOLIN HFA 108 (90 Base) MCG/ACT inhaler Inhale 2 puffs into the lungs See admin instructions. Take 2 puffs every 4-6 hours as needed for wheezing and shortness of breath   zolpidem (AMBIEN) 5 MG tablet Take 5 mg by mouth at bedtime as needed for sleep.    Past Medical History:  Diagnosis Date   Anemia of chronic disease    Anxiety    Arthritis    oa and psoriatic ra   Asthma    Back pain, chronic    lower back   Candida infection finished tx 2 days ago   Chronic insomnia    COPD (chronic obstructive pulmonary disease) (HCC)    Coronary artery calcification seen on CT scan    Diabetes mellitus    type 2   Eczema    ESRD on dialysis [facility])    Essential hypertension    GERD (gastroesophageal reflux disease)    Gout    History of hiatal hernia    Hypoalbuminemia    Hyponatremia    Mild aortic stenosis    Mitral regurgitation    Mitral stenosis    Obesity    PAD (peripheral artery disease) ([place])    a. externial iliac calcification seen on CT 04/2020.   Pneumonia few yrs ago x 2   Sleep apnea    Tobacco abuse    Upper GI bleed 05/2019    Past Surgical History:  Procedure Laterality Date   BACK SURGERY  2016   lower back fusion with cage   BIOPSY  09/23/2017   Procedure: BIOPSY;  Surgeon: Wonda Horner, MD;  [facility];  Service: Endoscopy;;   BIOPSY  05/19/2019   Procedure: BIOPSY;  Surgeon: Otis Brace, MD;  Location: [place];  Service: Gastroenterology;;   CESAREAN SECTION  1990   x 1    COLONOSCOPY WITH PROPOFOL N/A 09/23/2017   Procedure: COLONOSCOPY WITH PROPOFOL Hemostatic clips placed;  Surgeon: Wonda Horner, MD;  [facility];  Service: Endoscopy;  Laterality: N/A;   COLONOSCOPY WITH PROPOFOL N/A 10/02/2020   Procedure: COLONOSCOPY WITH PROPOFOL;  Surgeon: Arta Silence, MD;  Location: [place];  Service: Endoscopy;  Laterality: N/A;   CORONARY ARTERY BYPASS GRAFT N/A 09/13/2020   Procedure: CORONARY ARTERY BYPASS GRAFTING (CABG), ON PUMP, TIMES TWO, USING LEFT INTERNAL MAMMARY ARTERY AND RIGHT ENDOSCOPICALLY HARVESTED GREATER SAPHENOUS VEIN;  Surgeon: Gaye Pollack, MD;  Location: [place];  Service: Open Heart Surgery;  Laterality: N/A;   DILATION AND CURETTAGE OF UTERUS  1988   ESOPHAGOGASTRODUODENOSCOPY N/A 09/22/2020   Procedure: ESOPHAGOGASTRODUODENOSCOPY (EGD);  Surgeon: Arta Silence, MD;  Location: [facility] ENDOSCOPY;  Service: Endoscopy;  Laterality: N/A;   ESOPHAGOGASTRODUODENOSCOPY (EGD) WITH PROPOFOL N/A 09/23/2017   Procedure: ESOPHAGOGASTRODUODENOSCOPY (EGD) WITH PROPOFOL;  Surgeon: Wonda Horner, MD;  [facility];  Service: Endoscopy;  Laterality: N/A;   ESOPHAGOGASTRODUODENOSCOPY (EGD) WITH PROPOFOL N/A 05/19/2019   Procedure: ESOPHAGOGASTRODUODENOSCOPY (EGD) WITH PROPOFOL;  Surgeon: Otis Brace, MD;  Location: [place];  Service: Gastroenterology;  Laterality: N/A;   ESOPHAGOGASTRODUODENOSCOPY (EGD) WITH PROPOFOL N/A 10/02/2020   Procedure: ESOPHAGOGASTRODUODENOSCOPY (EGD) WITH PROPOFOL;  Surgeon: Arta Silence, MD;  Location: [place];  Service: Endoscopy;  Laterality: N/A;   GIVENS CAPSULE STUDY N/A 05/19/2019   Procedure: GIVENS CAPSULE STUDY;  Surgeon: Otis Brace, MD;  Location: [place];  Service: Gastroenterology;  Laterality: N/A;   HEMOSTASIS CLIP PLACEMENT  09/22/2020   Procedure: HEMOSTASIS CLIP PLACEMENT;  Surgeon: Arta Silence, MD;  Location: [place];  Service: Endoscopy;;   HEMOSTASIS CONTROL  09/22/2020   Procedure: HEMOSTASIS CONTROL;  Surgeon: Arta Silence, MD;  Location: [place];  Service: Endoscopy;;   HOT HEMOSTASIS N/A 05/19/2019    Procedure: HOT HEMOSTASIS (ARGON PLASMA COAGULATION/BICAP);  Surgeon: Otis Brace, MD;  [facility] ENDOSCOPY;  Service: Gastroenterology;  Laterality: N/A;   LEFT HEART CATH AND CORONARY ANGIOGRAPHY N/A 09/10/2020   Procedure: LEFT HEART CATH AND CORONARY ANGIOGRAPHY;  Surgeon: Nelva Bush, MD;  Location: [place] CV LAB;  Service: Cardiovascular;  Laterality: N/A;   PARTIAL KNEE ARTHROPLASTY Left 11/12/2017   Procedure: left unicompartmental arthroplasty-medial;  Surgeon: Paralee Cancel, MD;  [facility];  Service: Orthopedics;  Laterality: Left;  42min   SUBMUCOSAL INJECTION  09/23/2017   Procedure: SUBMUCOSAL INJECTION;  Surgeon: Wonda Horner, MD;  [facility];  Service: Endoscopy;;  in colon   TEE WITHOUT CARDIOVERSION N/A 09/13/2020   Procedure: TRANSESOPHAGEAL ECHOCARDIOGRAM (TEE);  Surgeon: Gaye Pollack, MD;  Location: [place];  Service: Open Heart Surgery;  Laterality: N/A;   TUBAL LIGATION  1990    Social:  Lives With: Daugher and son in law Level of Function: Performs own IADLS and ADLS PCP: Adline Mango MD. WF Substances:  Social History   Tobacco Use   Smoking status: Former    Packs/day: 0.50    Years: 40.00    Pack years: 20.00    Types: Cigarettes    Quit date: 08/2020    Years since quitting: 0.8   Smokeless tobacco: Never  Vaping Use   Vaping Use: Never used  Substance Use Topics   Alcohol use: Yes    Comment: occ   Drug use: No     Family History:  Family History  Problem Relation Age of Onset   Lung cancer Mother    Diabetes Sister    Heart disease Sister    Asthma Daughter    Arthritis Daughter    Arthritis Daughter    Thyroid cancer Other        1/2 sister   Heart disease Other        1/2 sister     Allergies: Allergies as of 06/26/2021 - Review Complete 06/26/2021  Allergen Reaction Noted   3-methyl-2-benzothiazolinone hydrazone Other (See Comments) 09/07/2012   Banana Anaphylaxis, Swelling, and Other (See  Comments) 01/21/2018   Black walnut flavor Anaphylaxis and Itching 05/18/2019   Hazelnut (filbert) allergy skin test Anaphylaxis 05/18/2019   Leflunomide  and related Other (See Comments) 07/23/2012   Lisinopril Swelling 01/05/2014   No healthtouch food allergies Anaphylaxis 05/18/2019   Other Other (See Comments), Anaphylaxis, and Swelling 09/07/2012   Pecan extract allergy skin test Anaphylaxis and Itching 05/18/2019   Pecan nut (diagnostic) Anaphylaxis and Itching 05/18/2019   Trazodone and nefazodone Other (See Comments) 07/23/2014   Adalimumab Other (See Comments) 07/23/2012   Escitalopram oxalate Other (See Comments) 09/29/2013   Ezetimibe Other (See Comments) 01/13/2018   Secukinumab Swelling 05/18/2019   Statins Other (See Comments) 09/04/2014   Ferrlecit [na ferric gluc cplx in sucrose]  09/19/2020   Gabapentin Other (See Comments) 06/26/2021   Ibuprofen Other (See Comments) 09/04/2014    Review of Systems: A complete ROS was negative except as per HPI.   OBJECTIVE:   Physical Exam: Blood pressure (!) 117/94, pulse 88, temperature (!) 102.4 F (39.1 C), temperature source Oral, resp. rate (!) 26, height 5\' 2"  (1.575 m), weight 65.8 kg, SpO2 97 %.   Physical Exam Constitutional:      General: She is not in acute distress.    Appearance: She is normal weight. She is ill-appearing.  HENT:     Mouth/Throat:     Mouth: Mucous membranes are moist.     Pharynx: No pharyngeal swelling or oropharyngeal exudate.  Neck:     Trachea: No tracheal deviation.  Cardiovascular:     Rate and Rhythm: Normal rate and regular rhythm.     Heart sounds: Murmur (3/6 systolic) heard.  Pulmonary:     Effort: Pulmonary effort is normal.     Breath sounds: Wheezing and rales present.  Musculoskeletal:     Right lower leg: No edema.     Left lower leg: No edema.     Comments: Low back pain, no tenderness with palpation over lumbar spine  Lymphadenopathy:     Cervical: No cervical  adenopathy.  Skin:    General: Skin is warm and dry.  Neurological:     Mental Status: She is alert and oriented to person, place, and time.  Psychiatric:        Mood and Affect: Mood normal.        Behavior: Behavior normal.    Imaging: DG Chest Port 1 View  Result Date: 06/26/2021 CLINICAL DATA:  SOB, dialysis patient, elevated BP EXAM: PORTABLE CHEST - 1 VIEW COMPARISON:  02/13/2021 FINDINGS: Some worsening of diffuse interstitial opacities and marked central pulmonary vascular congestion. Coarse atelectasis or scarring at the left lung base. Stable cardiomegaly. CABG marker and sternotomy wires. Aortic Atherosclerosis (ICD10-170.0). No effusion. Cervical fixation hardware partially visualized. IMPRESSION: Worsening pulmonary vascular congestion/edema. Stable cardiomegaly Electronically Signed   By: Lucrezia Europe M.D.   On: 06/26/2021 07:00    EKG: personally reviewed my interpretation is prolonged PR interval , sinus rhythm. Artifact, will repeat EKG.    ASSESSMENT & PLAN:    Assessment & Plan by Problem: Principal Problem:   Volume overload   BRYLA BUREK is a 61 y.o. with HTN, COPD on 3 L supplemental oxygen at baseline , and ESRD on HD MWF who presented with dyspnea, fever, and sore throat and admitted for volume overload.  #Volume overload in setting of ESRD on HD #Moderate Aortic stenosis and mitral stenosis  - Patient has pulmonary vascular congestion on xray. She has not missed HD, so I am not sure why she has had acute episode. I can not find recent dry weight in chart. She has history of degenerative mitral  valve with mild mitral regurtiation last Echo 11/22.Moderate aortic stenosis. Will order TTE to evaluated worsening of mitral valve. EKG with artifact, will repeat. Nephrology contacted by ED to place patient on HD list.  No need for emergent HD.   #Fever, sore throat - Fever 102 on admission, nl lactic acid and BP.  No source of infection. Patient's grandchild  recently had strep. No exudate. Possible patient is feeling congestion from volume overload. Patient prescribed doxycycline and has taken 3 doses. Not improved.  Negative for covid and influenza.  - RVP panel - Follow blood cultures - hold antibiotics  #Chronic respiratory failure with hypoxia, COPD, Asthma,OSA not on CPAP - on baseline 3 L supplemental oxygen.  I do not believe patient is in COPD exacerbation at this time as her dyspnea and cough can be explained by volume overload. She denied sputum production. Fever is concerning. - q4 duonebs x3  - scheduled home LAMA inhaler to start tomorrow - Continue home montelukast   #Hx of T2DM - hemoglobin a1c 5.2 09/13/2020. Diet controlled. - SSI-S  #HTN - SBP to 180's on admission. Home amlodipine given in ED before admission. Home medication regimen :amlodipine 10, clonidine .1 TID, Olmesartan 40 mg ( substitute Irbesartan on our formulary), hydralazine 100 mg q8hrs, Imdur 30 mg q 24 hours - restart amlodipine, clonidine, Olmesartan , and Imdur.   #Anxiety - continue home medication, Ativan 1 mg prn q8hrs   #Chronic back pain - Continue home PRN Oxycodone 10 mg  BID  #Insomnia - Continue home Ambien qhs  Diet: Renal VTE: Heparin IVF: None,None Code: Full  Prior to Admission Living Arrangement: Home, living family Anticipated Discharge Location: Home Barriers to Discharge: Treatment as above  Dispo: Admit patient to Inpatient with expected length of stay greater than 2 midnights.  Signed: Drema Pry, MD Internal Medicine Resident PGY-3 Pager: [phone]  06/26/2021, 11:26 AM

## 2021-06-26 NOTE — Consult Note (Signed)
[facility] ?Renal Consultation Note  ?  ?Indication for Consultation:  Management of ESRD/hemodialysis; anemia, hypertension/volume and secondary hyperparathyroidism ? ?XTG:GYIR, Cleone Slim., MD ? ?HPI: Jodi Bell is a 61 y.o. female. ESRD on HD MWF at [facility] in [place], first starting about 4 years ago.  Past medical history significant for HTN, diet controlled DMT2, COPD on 3L O2 at baseline, GERD, gout, PAD, diastolic HD and Hx CAD s/p CABG. ? ?Patient presents to the ED due to shortness of breath.  Reports intermittent fevers starting 6 days ago, followed by a sore/scratchy throat on Friday.  Reports grandson had strept throat last week. Went to PCP on Monday who gave antibiotics but she does not feel they are helping.  Yesterday she started having shortness of breath which progressively worsened so she came to the hospital.  Has not missed any dialysis except today.  Admits to non productive cough and diarrhea from the antibiotic.  Denies CP, abdominal pain, n/v, weakness, dizziness and fatigue.  Appetite is ok.   ? ?Pertinent findings in the ED include temp 102.4 F, tachypnea, negative respiratory panel and CXR with worsened pulmonary vascular congestion/edema.  Patient being admitted for further evaluation and management.  ? ?Past Medical History:  ?Diagnosis Date  ? Anemia of chronic disease   ? Anxiety   ? Arthritis   ? oa and psoriatic ra  ? Asthma   ? Back pain, chronic   ? lower back  ? Candida infection finished tx 2 days ago  ? Chronic insomnia   ? COPD (chronic obstructive pulmonary disease) ([place])   ? Coronary artery calcification seen on CT scan   ? Diabetes mellitus   ? type 2  ? Eczema   ? ESRD on dialysis [facility])   ? Essential hypertension   ? GERD (gastroesophageal reflux disease)   ? Gout   ? History of hiatal hernia   ? Hypoalbuminemia   ? Hyponatremia   ? Mild aortic stenosis   ? Mitral regurgitation   ? Mitral stenosis   ? Obesity   ? PAD (peripheral artery disease) ([place])    ? a. externial iliac calcification seen on CT 04/2020.  ? Pneumonia few yrs ago x 2  ? Sleep apnea   ? Tobacco abuse   ? Upper GI bleed 05/2019  ? ?Past Surgical History:  ?Procedure Laterality Date  ? BACK SURGERY  2016  ? lower back fusion with cage  ? BIOPSY  09/23/2017  ? Procedure: BIOPSY;  Surgeon: Wonda Horner, MD;  [facility];  Service: Endoscopy;;  ? BIOPSY  05/19/2019  ? Procedure: BIOPSY;  Surgeon: Otis Brace, MD;  Location: [place] END[facility];  Service: Gastroenterology;;  ? [place]  ? x 1   ? COLONOSCOPY WITH PROPOFOL N/A 09/23/2017  ? Procedure: COLONOSCOPY WITH PROPOFOL Hemostatic clips placed;  Surgeon: Wonda Horner, MD;  [facility];  Service: Endoscopy;  Laterality: N/A;  ? COLONOSCOPY WITH PROPOFOL N/A 10/02/2020  ? Procedure: COLONOSCOPY WITH PROPOFOL;  Surgeon: Arta Silence, MD;  Location: [place];  Service: Endoscopy;  Laterality: N/A;  ? CORONARY ARTERY BYPASS GRAFT N/A 09/13/2020  ? Procedure: CORONARY ARTERY BYPASS GRAFTING (CABG), ON PUMP, TIMES TWO, USING LEFT INTERNAL MAMMARY ARTERY AND RIGHT ENDOSCOPICALLY HARVESTED GREATER SAPHENOUS VEIN;  Surgeon: Gaye Pollack, MD;  Location: [place];  Service: Open Heart Surgery;  Laterality: N/A;  ? [place] OF UTERUS  1988  ? ESOPHAGOGASTRODUODENOSCOPY N/A 09/22/2020  ?  Procedure: ESOPHAGOGASTRODUODENOSCOPY (EGD);  Surgeon: Arta Silence, MD;  Location: [facility] ENDOSCOPY;  Service: Endoscopy;  Laterality: N/A;  ? ESOPHAGOGASTRODUODENOSCOPY (EGD) WITH PROPOFOL N/A 09/23/2017  ? Procedure: ESOPHAGOGASTRODUODENOSCOPY (EGD) WITH PROPOFOL;  Surgeon: Wonda Horner, MD;  [facility];  Service: Endoscopy;  Laterality: N/A;  ? ESOPHAGOGASTRODUODENOSCOPY (EGD) WITH PROPOFOL N/A 05/19/2019  ? Procedure: ESOPHAGOGASTRODUODENOSCOPY (EGD) WITH PROPOFOL;  Surgeon: Otis Brace, MD;  Location: [facility];  Service: Gastroenterology;  Laterality: N/A;  ? ESOPHAGOGASTRODUODENOSCOPY (EGD) WITH  PROPOFOL N/A 10/02/2020  ? Procedure: ESOPHAGOGASTRODUODENOSCOPY (EGD) WITH PROPOFOL;  Surgeon: Arta Silence, MD;  Location[facility] ENDOSCOPY;  Service: Endoscopy;  Laterality: N/A;  ? GIVENS CAPSULE STUDY N/A 05/19/2019  ? Procedure: GIVENS CAPSULE STUDY;  Surgeon: Otis Brace, MD;  Location: [place];  Service: Gastroenterology;  Laterality: N/A;  ? HEMOSTASIS CLIP PLACEMENT  09/22/2020  ? Procedure: HEMOSTASIS CLIP PLACEMENT;  Surgeon: Arta Silence, MD;  Location: [place];  Service: Endoscopy;;  ? HEMOSTASIS CONTROL  09/22/2020  ? Procedure: HEMOSTASIS CONTROL;  Surgeon: Arta Silence, MD;  Location: [place];  Service: Endoscopy;;  ? HOT HEMOSTASIS N/A 05/19/2019  ? Procedure: HOT HEMOSTASIS (ARGON PLASMA COAGULATION/BICAP);  Surgeon: Otis Brace, MD;  Location: [facility] ENDOSCOPY;  Service: Gastroenterology;  Laterality: N/A;  ? LEFT HEART CATH AND CORONARY ANGIOGRAPHY N/A 09/10/2020  ? Procedure: LEFT HEART CATH AND CORONARY ANGIOGRAPHY;  Surgeon: Nelva Bush, MD;  Location: [place] CV LAB;  Service: Cardiovascular;  Laterality: N/A;  ? PARTIAL KNEE ARTHROPLASTY Left 11/12/2017  ? Procedure: left unicompartmental arthroplasty-medial;  Surgeon: Paralee Cancel, MD;  [facility];  Service: Orthopedics;  Laterality: Left;  41min  ? SUBMUCOSAL INJECTION  09/23/2017  ? Procedure: SUBMUCOSAL INJECTION;  Surgeon: Wonda Horner, MD;  [facility];  Service: Endoscopy;;  in colon  ? TEE WITHOUT CARDIOVERSION N/A 09/13/2020  ? Procedure: TRANSESOPHAGEAL ECHOCARDIOGRAM (TEE);  Surgeon: Gaye Pollack, MD;  Location: [place];  Service: Open Heart Surgery;  Laterality: N/A;  ? TUBAL LIGATION  1990  ? ?Family History  ?Problem Relation Age of Onset  ? Lung cancer Mother   ? Diabetes Sister   ? Heart disease Sister   ? Asthma Daughter   ? Arthritis Daughter   ? Arthritis Daughter   ? Thyroid cancer Other   ?     1/2 sister  ? Heart disease Other   ?     1/2 sister  ? ?Social History: ? reports  that she quit smoking about 10 months ago. Her smoking use included cigarettes. She has a 20.00 pack-year smoking history. She has never used smokeless tobacco. She reports current alcohol use. She reports that she does not use drugs. ?Allergies  ?Allergen Reactions  ? 3-Methyl-2-Benzothiazolinone Hydrazone Other (See Comments)  ?  Muscle weakness ?muscle cramping in legs ?Other reaction(s): Myalgias (Muscle Pain)  ? Banana Anaphylaxis, Swelling and Other (See Comments)  ?  TONGUE AND MOUTH SWELLING  ? Black Walnut Flavor Anaphylaxis and Itching  ?  Walnuts  ? [facility]) Allergy Skin Test Anaphylaxis  ?  Hazelnuts  ? Leflunomide And Related Other (See Comments)  ?  Severe Headache  ? Lisinopril Swelling  ?  Angioedema  ?Swelling of the face  ? No Healthtouch Food Allergies Anaphylaxis  ?  Spicy Mustard ?4.7.2021 Pt reports that she eats Regular Yellow Mustard ?Swelling and itching of tongue  ? Other Other (See Comments), Anaphylaxis and Swelling  ?  Cosentyx= angioedema ? ?Other reaction(s): Facial Edema (intolerance) ?Mouth itches  and tongue swelling ?Hazel nuts and pecans also ?Walnuts ?Walnuts ?Renal failure ?Other reaction(s): Other ?Serotonin Syndrome  ?Serotonin syndrome  ?Tremors ?Other reaction(s): Other ?Tremors ?Muscle weakness ?muscle cramping in legs ?Other reaction(s): Myalgias (Muscle Pain) ?Mouth itches and tongue swelling ?Hazel nuts and pecans also ?Walnuts ?  ? Pecan Extract Allergy Skin Test Anaphylaxis and Itching  ?  Pecans ?  ? Pecan Nut (Diagnostic) Anaphylaxis and Itching  ?  Pecans  ? Trazodone And Nefazodone Other (See Comments)  ?  Serotonin Syndrome  ?Tremors  ? Adalimumab Other (See Comments)  ?  Blisters on abdomen =humira  ? Escitalopram Oxalate Other (See Comments)  ?  Hand problems - Serotonin Syndrome  ?  ? Ezetimibe Other (See Comments)  ?  myalgias ?cramps ?  ? Secukinumab Swelling  ?  Cosentyx = angiodema ?  ? Statins Other (See Comments)  ?  Leg pains and weakness  ?  Ferrlecit [Na Ferric Gluc Cplx In Sucrose]   ?  Not reaction given from HD  ? Gabapentin Other (See Comments)  ?  tremors  ? Ibuprofen Other (See Comments)  ?  Renal Problems  ? ?Current Facility-Administer

## 2021-06-26 NOTE — ED Triage Notes (Signed)
SOB X1 day, was supposed to dialysis today but felt too SOB to go to appointment. Wears 3LNC @ home.  ?Pt also currently being treated for sore throat w/ oral abx ?

## 2021-06-26 NOTE — ED Notes (Signed)
Pt states she makes little urine  ?

## 2021-06-26 NOTE — ED Notes (Signed)
Repositioned pt in bed, applied mepilex to sacrum per pt request ?

## 2021-06-26 NOTE — ED Provider Notes (Signed)
[facility] EMERGENCY DEPARTMENT Provider Note   CSN: [id number] Arrival date & time: 06/26/21  4492     History  Chief Complaint  Patient presents with   Shortness of Breath    Jodi Bell is a 61 y.o. female.   Shortness of Breath Associated symptoms: fever and sore throat   Associated symptoms: no abdominal pain, no chest pain and no vomiting    61 year old female presents emergency department with 3-day history of shortness of breath.  She said that the symptoms began on Sunday with a minor irritated throat. Symptoms began to progress to include cough, congestion, runny nose and fever. She now states she is short of breath at rest.  She was placed on doxycycline by her PCP on Monday for sore throat.  She states these have not helped her symptoms.  She states she had a documented fever above 100.3 yesterday he was febrile upon presentation to the emergency department today.  She has not taken anything for fever.  At baseline, she wears 3 L of oxygen at home.  No known sick contact she has history of grandchild had similar respiratory symptoms over the weekend.  She is a dialysis patient and goes Monday Wednesday Friday to be dialyzed.  She states she does make minimal urine.  She denies chest pain, abdominal pain, nausea/vomiting/diarrhea, urinary symptoms, change in bowel habits.    She has an extensive past medical history significant for ESRD with dialysis, COPD, diabetes mellitus, hypertension, CHF  Home Medications Prior to Admission medications   Medication Sig Start Date End Date Taking? Authorizing Provider  acetaminophen (TYLENOL) 500 MG tablet Take 1,000 mg by mouth every 6 (six) hours as needed for mild pain.    [provider]  allopurinol (ZYLOPRIM) 100 MG tablet Take 100 mg by mouth every evening.    [provider]  amLODipine (NORVASC) 10 MG tablet Take 10 mg by mouth daily.    [provider]  carboxymethylcellulose  1 % ophthalmic solution Place 1 drop into both eyes as needed (dry eyes).    [provider]  cholecalciferol (VITAMIN D) 25 MCG tablet Take 1 tablet (1,000 Units total) by mouth every evening. 10/04/20   Gold, Patrick Jupiter E, PA-C  cloNIDine (CATAPRES) 0.1 MG tablet Take 0.1 mg by mouth 3 (three) times daily.    [provider]  Darbepoetin Alfa (ARANESP) 200 MCG/0.4ML SOSY injection Inject 0.4 mLs (200 mcg total) into the vein every Wednesday with hemodialysis. 10/10/20   Gold, Wilder Glade, PA-C  EPINEPHrine (EPIPEN) 0.3 mg/0.3 mL SOAJ injection Inject 0.3 mLs (0.3 mg total) into the muscle once. 09/30/12   Le, Thao P, DO  folic acid (FOLVITE) 1 MG tablet Take 1 tablet (1 mg total) by mouth daily. 10/04/20   Jadene Pierini E, PA-C  gabapentin (NEURONTIN) 300 MG capsule Take 1 capsule (300 mg total) by mouth at bedtime as needed (Neuropathic pain). 12/14/17 01/10/21  Dana Allan I, MD  heparin 1000 unit/mL SOLN injection 3 mLs (3,000 Units total) by Dialysis route as needed (in dialysis as needed). 10/04/20   Jadene Pierini E, PA-C  heparin 1000 unit/mL SOLN injection 2 mLs (2,000 Units total) by Dialysis route as needed (in dialysis as needed). 10/04/20   Jadene Pierini E, PA-C  heparin 1000 unit/mL SOLN injection 1 mL (1,000 Units total) by Dialysis route as needed (in dialysis). 10/04/20   Jadene Pierini E, PA-C  hydrALAZINE (APRESOLINE) 100 MG tablet Take 1 tablet (100 mg  total) by mouth every 8 (eight) hours. 07/06/20   [name], MD  isosorbide mononitrate (IMDUR) 30 MG 24 hr tablet Take 1 tablet (30 mg total) by mouth daily. 02/20/21   Jerline Pain, MD  labetalol (NORMODYNE) 200 MG tablet TAKE 1 TABLET BY MOUTH THREE TIMES DAILY 12/10/20   Gold, Wayne E, PA-C  LORazepam (ATIVAN) 1 MG tablet Take 1 mg by mouth every 8 (eight) hours as needed for anxiety. 06/25/20   [provider]  montelukast (SINGULAIR) 10 MG tablet Take 10 mg by mouth daily with supper.     [provider]   multivitamin (RENA-VIT) TABS tablet Take 1 tablet by mouth daily with supper.    [provider]  olmesartan (BENICAR) 40 MG tablet Take 40 mg by mouth every evening. 05/06/19   [provider]  Oxycodone HCl 10 MG TABS Take 10 mg by mouth 2 (two) times daily as needed for pain. 01/11/21   [provider]  OXYGEN Inhale 2 L into the lungs as directed.    [provider]  pantoprazole (PROTONIX) 40 MG tablet Take 40 mg by mouth daily. 06/09/20   [provider]  pentafluoroprop-tetrafluoroeth Landry Dyke) AERO Apply 1 application topically as needed (topical anesthesia for hemodialysis). 10/04/20   Gold, Patrick Jupiter E, PA-C  polyethylene glycol powder (GLYCOLAX/MIRALAX) 17 GM/SCOOP powder Mix 17 grams into 8 ounces of liquid and drink by mouth daily. 03/19/21   Carollee Leitz, MD  predniSONE (DELTASONE) 5 MG tablet Take 1 tablet (5 mg total) by mouth daily with breakfast. 02/21/21   Dwyane Dee, MD  Probiotic Product (PROBIOTIC PO) Take 1 capsule by mouth daily.    [provider]  Tiotropium Bromide Monohydrate (SPIRIVA RESPIMAT IN) Inhale 2 puffs into the lungs daily.    [provider]  VENTOLIN HFA 108 (90 Base) MCG/ACT inhaler Inhale 1-2 puffs into the lungs every 6 (six) hours as needed for shortness of breath. wheezing 06/13/15   [provider]  zolpidem (AMBIEN) 5 MG tablet Take 5 mg by mouth at bedtime as needed for sleep. 02/04/21   [provider]      Allergies    3-methyl-2-benzothiazolinone hydrazone, Banana, Black walnut flavor, Hazelnut (filbert) allergy skin test, Leflunomide and related, Lisinopril, No healthtouch food allergies, Other, Pecan extract allergy skin test, Pecan nut (diagnostic), Trazodone and nefazodone, Adalimumab, Escitalopram oxalate, Ezetimibe, Secukinumab, Statins, Ferrlecit [na ferric gluc cplx in sucrose], Gabapentin, and Ibuprofen    Review of Systems   Review of Systems  Constitutional:   Positive for fatigue and fever. Negative for chills.  HENT:  Positive for congestion, postnasal drip, rhinorrhea and sore throat. Negative for mouth sores.   Respiratory:  Positive for shortness of breath.   Cardiovascular:  Negative for chest pain and palpitations.  Gastrointestinal:  Negative for abdominal pain, diarrhea, nausea and vomiting.  Genitourinary:  Negative for dysuria and hematuria.  Neurological:  Negative for seizures, syncope and weakness.   Physical Exam Updated Vital Signs BP (!) 178/76 (BP Location: Right Arm)   Pulse 86   Temp (!) 102.4 F (39.1 C) (Oral)   Resp (!) 22   Ht $R'5\' 2"'FW$  (1.575 m)   Wt 65.8 kg   SpO2 99%   BMI 26.53 kg/m  Physical Exam Vitals and nursing note reviewed.  Constitutional:      General: She is in acute distress.     Appearance: She is well-developed. She is obese. She is ill-appearing. She is not toxic-appearing or  diaphoretic.  HENT:     Head: Normocephalic and atraumatic.     Mouth/Throat:     Mouth: Mucous membranes are moist.     Pharynx: Pharyngeal swelling present.  Eyes:     Extraocular Movements: Extraocular movements intact.     Pupils: Pupils are equal, round, and reactive to light.  Neck:     Vascular: No JVD.  Cardiovascular:     Rate and Rhythm: Normal rate and regular rhythm.     Heart sounds: Murmur heard.     Comments: Systolic murmur noted right and left upper sternal border as well as left lower sternal border.  Pulmonary:     Effort: No tachypnea.     Breath sounds: Wheezing present.     Comments: Patient has diffuse wheezing throughout lung fields.  Breathing is labored.  She is tachypneic. Chest:     Chest wall: No tenderness.  Abdominal:     General: Bowel sounds are normal.     Palpations: Abdomen is soft. There is no mass.     Tenderness: There is no abdominal tenderness.  Musculoskeletal:        General: Normal range of motion.     Cervical back: Normal range of motion and neck supple.      Comments: No Lower extremity edema noted.   Skin:    General: Skin is warm and dry.     Capillary Refill: Capillary refill takes less than 2 seconds.  Neurological:     General: No focal deficit present.     Mental Status: She is alert and oriented to person, place, and time.  Psychiatric:        Mood and Affect: Mood normal.        Behavior: Behavior normal.    ED Results / Procedures / Treatments   Labs (all labs ordered are listed, but only abnormal results are displayed) Labs Reviewed  CULTURE, BLOOD (ROUTINE X 2)  CULTURE, BLOOD (ROUTINE X 2)  URINE CULTURE  LACTIC ACID, PLASMA  LACTIC ACID, PLASMA  COMPREHENSIVE METABOLIC PANEL  CBC WITH DIFFERENTIAL/PLATELET  PROTIME-INR  APTT  URINALYSIS, ROUTINE W REFLEX MICROSCOPIC    EKG EKG Interpretation  Date/Time:  Wednesday Jun 26 2021 06:32:43 EDT Ventricular Rate:  87 PR Interval:  256 QRS Duration: 103 QT Interval:  358 QTC Calculation: 429 R Axis:   -44 Text Interpretation: Sinus or ectopic atrial rhythm Prolonged PR interval Baseline wander Consider left atrial enlargement Abnormal R-wave progression, late transition Left ventricular hypertrophy Borderline T abnormalities, inferior leads Interpretation limited secondary to artifact Confirmed by Calvert Cantor [phone]) on 06/26/2021 6:49:10 AM  Radiology DG CHEST PORT 1 VIEW  Result Date: 06/27/2021 CLINICAL DATA:  Shortness of breath EXAM: PORTABLE CHEST 1 VIEW COMPARISON:  06/26/2021 FINDINGS: Bilateral diffuse interstitial thickening with right infrahilar airspace disease. No pleural effusion or pneumothorax. Stable cardiomegaly. Prior median sternotomy. Prior CABG. No acute osseous abnormality. IMPRESSION: 1. Findings concerning for mild CHF. Right infrahilar airspace disease which may reflect atelectasis versus pneumonia. Electronically Signed   By: Kathreen Devoid M.D.   On: 06/27/2021 10:31   ECHOCARDIOGRAM COMPLETE  Result Date: 06/26/2021    ECHOCARDIOGRAM  REPORT   Patient Name:   Jodi Bell Date of Exam: 06/26/2021 Medical Rec #:  [id number]        Height:       62.0 in Accession #:    [id number]       Weight:       145.1 lb  Date of Birth:  Jun 27, 1960         BSA:          1.668 m Patient Age:    55 years         BP:           135/59 mmHg Patient Gender: F                HR:           87 bpm. Exam Location:  Inpatient Procedure: 2D Echo, Cardiac Doppler and Color Doppler Indications:    Dyspnea R06.00  History:        Patient has prior history of Echocardiogram examinations, most                 recent 01/02/2021. COPD, Signs/Symptoms:Dyspnea; Risk                 Factors:Diabetes, Hypertension, GERD, Sleep Apnea and Current                 Smoker.  Sonographer:    Bernadene Person RDCS Referring Phys: [id number] [place]  1. Left ventricular ejection fraction, by estimation, is 70 to 75%. The left ventricle has hyperdynamic function. The left ventricle has no regional wall motion abnormalities. There is severe left ventricular hypertrophy. Left ventricular diastolic parameters are consistent with Grade II diastolic dysfunction (pseudonormalization). Elevated left ventricular end-diastolic pressure.  2. Right ventricular systolic function is normal. The right ventricular size is normal. There is normal pulmonary artery systolic pressure.  3. Left atrial size was severely dilated.  4. Right atrial size was mildly dilated.  5. The mitral valve is degenerative. Mild mitral valve regurgitation. No evidence of mitral stenosis. Severe mitral annular calcification.  6. Tricuspid valve regurgitation is mild to moderate.  7. Visually, by planimetry, and by DI aortic valve has moderate stenosis. However, peak velocity >4, suggesting severe stenosis. This does not appear to be explained by LVOT gradient/obstruction. The aortic valve is tricuspid. There is moderate calcification of the aortic valve. Aortic valve regurgitation is not visualized. Moderate to severe  aortic valve stenosis.  8. The inferior vena cava is dilated in size with <50% respiratory variability, suggesting right atrial pressure of 15 mmHg. Comparison(s): Changes from prior study are noted. Moderate to severe aortic stenosis; peak velocity >4, but by visual/planimetry/DI suggests moderate. FINDINGS  Left Ventricle: Left ventricular ejection fraction, by estimation, is 70 to 75%. The left ventricle has hyperdynamic function. The left ventricle has no regional wall motion abnormalities. The left ventricular internal cavity size was normal in size. There is severe left ventricular hypertrophy. Left ventricular diastolic parameters are consistent with Grade II diastolic dysfunction (pseudonormalization). Elevated left ventricular end-diastolic pressure. Right Ventricle: The right ventricular size is normal. Right vetricular wall thickness was not well visualized. Right ventricular systolic function is normal. There is normal pulmonary artery systolic pressure. The tricuspid regurgitant velocity is 2.58 m/s, and with an assumed right atrial pressure of 8 mmHg, the estimated right ventricular systolic pressure is 40.3 mmHg. Left Atrium: Left atrial size was severely dilated. Right Atrium: Right atrial size was mildly dilated. Pericardium: Trivial pericardial effusion is present. Mitral Valve: The mitral valve is degenerative in appearance. Severe mitral annular calcification. Mild mitral valve regurgitation. No evidence of mitral valve stenosis. Tricuspid Valve: The tricuspid valve is normal in structure. Tricuspid valve regurgitation is mild to moderate. No evidence of tricuspid stenosis. Aortic Valve: Visually, by planimetry, and by DI aortic valve  has moderate stenosis. However, peak velocity >4, suggesting severe stenosis. This does not appear to be explained by LVOT gradient/obstruction. The aortic valve is tricuspid. There is moderate calcification of the aortic valve. Aortic valve regurgitation is not  visualized. Moderate to severe aortic stenosis is present. Aortic valve mean gradient measures 41.3 mmHg. Aortic valve peak gradient measures 73.2 mmHg. Aortic valve area, by VTI measures 1.27 cm. Pulmonic Valve: The pulmonic valve was not well visualized. Pulmonic valve regurgitation is not visualized. No evidence of pulmonic stenosis. Aorta: The aortic root, ascending aorta, aortic arch and descending aorta are all structurally normal, with no evidence of dilitation or obstruction. Venous: The inferior vena cava is dilated in size with less than 50% respiratory variability, suggesting right atrial pressure of 15 mmHg. IAS/Shunts: The atrial septum is grossly normal. Additional Comments: There is a small pleural effusion in both left and right lateral regions.  LEFT VENTRICLE PLAX 2D LVIDd:         3.66 cm      Diastology LVIDs:         2.08 cm      LV e' medial:    4.60 cm/s LV PW:         1.77 cm      LV E/e' medial:  39.8 LV IVS:        2.11 cm      LV e' lateral:   7.42 cm/s LVOT diam:     2.00 cm      LV E/e' lateral: 24.7 LV SV:         108 LV SV Index:   65 LVOT Area:     3.14 cm  LV Volumes (MOD) LV vol d, MOD A2C: 101.0 ml LV vol d, MOD A4C: 133.0 ml LV vol s, MOD A2C: 28.2 ml LV vol s, MOD A4C: 47.9 ml LV SV MOD A2C:     72.8 ml LV SV MOD A4C:     133.0 ml LV SV MOD BP:      83.7 ml RIGHT VENTRICLE RV S prime:     11.30 cm/s TAPSE (M-mode): 1.8 cm LEFT ATRIUM             Index        RIGHT ATRIUM           Index LA diam:        4.60 cm 2.76 cm/m   RA Area:     18.20 cm LA Vol (A2C):   92.3 ml 55.34 ml/m  RA Volume:   44.70 ml  26.80 ml/m LA Vol (A4C):   86.8 ml 52.04 ml/m LA Biplane Vol: 91.0 ml 54.56 ml/m  AORTIC VALVE AV Area (Vmax):    1.28 cm AV Area (Vmean):   1.25 cm AV Area (VTI):     1.27 cm AV Vmax:           427.67 cm/s AV Vmean:          301.333 cm/s AV VTI:            0.852 m AV Peak Grad:      73.2 mmHg AV Mean Grad:      41.3 mmHg LVOT Vmax:         174.00 cm/s LVOT Vmean:         120.000 cm/s LVOT VTI:          0.343 m LVOT/AV VTI ratio: 0.40  AORTA Ao Root diam: 2.90 cm Ao Asc diam:  3.70 cm MITRAL VALVE                TRICUSPID VALVE MV Area (PHT): 2.73 cm     TR Peak grad:   26.6 mmHg MV Decel Time: 278 msec     TR Vmax:        258.00 cm/s MR PISA:        1.57 cm MR PISA Radius: 0.50 cm     SHUNTS MV E velocity: 183.00 cm/s  Systemic VTI:  0.34 m MV A velocity: 116.00 cm/s  Systemic Diam: 2.00 cm MV E/A ratio:  1.58 Buford Dresser MD Electronically signed by Buford Dresser MD Signature Date/Time: 06/26/2021/8:40:43 PM    Final     Procedures Procedures    Medications Ordered in ED Medications  heparin injection 5,000 Units (5,000 Units Subcutaneous Given 06/28/21 0025)  senna-docusate (Senokot-S) tablet 1 tablet (has no administration in time range)  allopurinol (ZYLOPRIM) tablet 100 mg (100 mg Oral Given 06/27/21 1848)  amLODipine (NORVASC) tablet 10 mg (10 mg Oral Given 06/27/21 0856)  cloNIDine (CATAPRES) tablet 0.1 mg (0.1 mg Oral Given 06/27/21 2231)  LORazepam (ATIVAN) tablet 1 mg (1 mg Oral Given 06/27/21 1320)  montelukast (SINGULAIR) tablet 10 mg (10 mg Oral Given 06/27/21 1849)  multivitamin (RENA-VIT) tablet 1 tablet (1 tablet Oral Given 06/27/21 2231)  irbesartan (AVAPRO) tablet 300 mg (300 mg Oral Given 06/27/21 0856)  oxyCODONE (Oxy IR/ROXICODONE) immediate release tablet 10 mg (10 mg Oral Given 06/27/21 0023)  pantoprazole (PROTONIX) EC tablet 40 mg (40 mg Oral Given 06/27/21 0855)  acidophilus (RISAQUAD) capsule 1 capsule (1 capsule Oral Given 06/27/21 0856)  zolpidem (AMBIEN) tablet 5 mg (5 mg Oral Given 06/27/21 2230)  ipratropium-albuterol (DUONEB) 0.5-2.5 (3) MG/3ML nebulizer solution 3 mL (3 mLs Nebulization Not Given 06/26/21 1200)  polyvinyl alcohol (LIQUIFILM TEARS) 1.4 % ophthalmic solution 1 drop (has no administration in time range)  umeclidinium bromide (INCRUSE ELLIPTA) 62.5 MCG/ACT ( Inhalation Given 06/28/21 0807)  calcium acetate  (PHOSLO) capsule 1,334 mg (1,334 mg Oral Given 06/28/21 0827)  cinacalcet (SENSIPAR) tablet 30 mg (30 mg Oral Given 06/28/21 0827)  isosorbide mononitrate (IMDUR) 24 hr tablet 30 mg (30 mg Oral Given 06/27/21 0855)  Chlorhexidine Gluconate Cloth 2 % PADS 6 each (6 each Topical Given 06/28/21 0432)  heparin injection 1,000 Units (has no administration in time range)  predniSONE (DELTASONE) tablet 20 mg (20 mg Oral Given 06/28/21 0827)  azithromycin (ZITHROMAX) tablet 500 mg (500 mg Oral Given 06/27/21 1402)  doxercalciferol (HECTOROL) injection 4 mcg (has no administration in time range)  (feeding supplement) PROSource Plus liquid 30 mL (30 mLs Oral Given 06/27/21 1402)  pentafluoroprop-tetrafluoroeth (GEBAUERS) aerosol 1 application. (has no administration in time range)  lidocaine (PF) (XYLOCAINE) 1 % injection 5 mL (has no administration in time range)  lidocaine-prilocaine (EMLA) cream 1 application. (has no administration in time range)  0.9 %  sodium chloride infusion (has no administration in time range)  0.9 %  sodium chloride infusion (has no administration in time range)  heparin injection 1,000 Units (has no administration in time range)  alteplase (CATHFLO ACTIVASE) injection 2 mg (has no administration in time range)  acetaminophen (TYLENOL) tablet 1,000 mg (has no administration in time range)  Darbepoetin Alfa (ARANESP) injection 100 mcg (has no administration in time range)  labetalol (NORMODYNE) tablet 200 mg (200 mg Oral Given 06/27/21 2231)  ipratropium-albuterol (DUONEB) 0.5-2.5 (3) MG/3ML nebulizer solution 3 mL (3 mLs Nebulization Given 06/28/21 0807)  acetaminophen (TYLENOL) tablet 650 mg (650 mg Oral Given 06/26/21 0749)  ipratropium-albuterol (DUONEB) 0.5-2.5 (3) MG/3ML nebulizer solution 3 mL (3 mLs Nebulization Given 06/26/21 0750)  amLODipine (NORVASC) tablet 10 mg (10 mg Oral Given 06/26/21 0749)  ipratropium-albuterol (DUONEB) 0.5-2.5 (3) MG/3ML nebulizer solution 3 mL (3 mLs  Nebulization Given 06/26/21 0908)  acetaminophen (TYLENOL) tablet 650 mg (650 mg Oral Given 06/26/21 2124)  ondansetron (ZOFRAN-ODT) disintegrating tablet 4 mg (4 mg Oral Given 06/27/21 0023)    ED Course/ Medical Decision Making/ A&P Clinical Course as of 06/26/21 7955  Wed Jun 26, 2021  0745 Talked to nephrology. They will see the patient [CR]  0750 Hospitalist consulted. They agreed to see and admit the patient.  [CR]    Clinical Course User Index [CR] Peter Garter, PA                           Medical Decision Making Amount and/or Complexity of Data Reviewed Labs: ordered. Radiology: ordered. ECG/medicine tests: ordered.  Risk OTC drugs. Prescription drug management. Decision regarding hospitalization.   This patient presents to the ED for concern of shortness of breath, this involves an extensive number of treatment options, and is a complaint that carries with it a high risk of complications and morbidity.  The differential diagnosis includes CHF, COPD exacerbation, COVID/flu, pneumonia, PE, ACS   Co morbidities that complicate the patient evaluation  ESRD with dialysis, COPD, diabetes mellitus, hypertension, CHF   Additional history obtained:  Additional history obtained from office note from 06/24/2021 for visit for pharyngitis; echo from 01/02/2021: Left-ventricular ejection fraction 65 to 70% with mildly reduced right ventricular systolic function External records from outside source obtained and reviewed including prescription for doxycycline   Lab Tests:  I Ordered, and personally interpreted labs.  The pertinent results include:  GFR w/ creatinine 7.94 and BUN 39, Alk Phos 206, Prothrombin 17.3, INR 1.4, RBC 2.86, Hgb 9.1, HCT 30.2   Imaging Studies ordered:  I ordered imaging studies including chest x-ray  I independently visualized and interpreted imaging which showed worsening pulmonary vascular congestion. Stable cardiomegaly I agree with the  radiologist interpretation   Cardiac Monitoring: / EKG:  The patient was maintained on a cardiac monitor.  I personally viewed and interpreted the cardiac monitored which showed an underlying rhythm of: sinus rhythm   Consultations Obtained:  I requested consultation with the hospital medicine nephrology,  and discussed lab and imaging findings as well as pertinent plan - they recommend: admission to the hospital with dialysis   Problem List / ED Course / Critical interventions / Medication management  Volume overload Duoneb for SOB Reevaluation of the patient after these medicines showed that the patient improved I have reviewed the patients home medicines and have made adjustments as needed   Test / Admission - Considered:  Volume overload VS significant for tachypnea with RR 22 and febrile with temp of 102.2. Patient was febrile and labored in breathing upon presentation Given that the patient was volume overloaded, febrile and due for dialysis, admission to the hospital was deemed necessary for appropriate dialyzing as well as further infectious workup. Patient was workup up as if bacteremic while in the ED.  Nephrology and hospital medicine consulted, and they agreed with admission and assuming further treatment/care of the patient. Treatment plan going forward was explained to the patient, and the patient acknowledged understanding. She was agreeable to the plan. Patient was stable upon admission.  Final Clinical Impression(s) / ED Diagnoses Final diagnoses:  Hypervolemia, unspecified hypervolemia type    Rx / DC Orders ED Discharge Orders     None         Wilnette Kales, [place] 06/28/21 6859    Truddie Hidden, MD 07/01/21 [phone]

## 2021-06-26 NOTE — Progress Notes (Signed)
?  Echocardiogram ?2D Echocardiogram has been performed. ? ?Jodi Bell ?06/26/2021, 2:05 PM ?

## 2021-06-27 ENCOUNTER — Inpatient Hospital Stay (HOSPITAL_COMMUNITY): Payer: Medicare PPO

## 2021-06-27 DIAGNOSIS — B340 Adenovirus infection, unspecified: Secondary | ICD-10-CM

## 2021-06-27 DIAGNOSIS — Z87891 Personal history of nicotine dependence: Secondary | ICD-10-CM

## 2021-06-27 DIAGNOSIS — I10 Essential (primary) hypertension: Secondary | ICD-10-CM

## 2021-06-27 DIAGNOSIS — J441 Chronic obstructive pulmonary disease with (acute) exacerbation: Secondary | ICD-10-CM

## 2021-06-27 DIAGNOSIS — E8779 Other fluid overload: Secondary | ICD-10-CM

## 2021-06-27 LAB — RENAL FUNCTION PANEL
Albumin: 2.7 g/dL — ABNORMAL LOW (ref 3.5–5.0)
Anion gap: 9 (ref 5–15)
BUN: 17 mg/dL (ref 8–23)
CO2: 25 mmol/L (ref 22–32)
Calcium: 8.5 mg/dL — ABNORMAL LOW (ref 8.9–10.3)
Chloride: 98 mmol/L (ref 98–111)
Creatinine, Ser: 4.43 mg/dL — ABNORMAL HIGH (ref 0.44–1.00)
GFR, Estimated: 11 mL/min — ABNORMAL LOW (ref >60)
Glucose, Bld: 62 mg/dL — ABNORMAL LOW (ref 70–99)
Phosphorus: 3.7 mg/dL (ref 2.5–4.6)
Potassium: 4 mmol/L (ref 3.5–5.1)
Sodium: 132 mmol/L — ABNORMAL LOW (ref 135–145)

## 2021-06-27 LAB — CBC WITH DIFFERENTIAL/PLATELET
Abs Immature Granulocytes: 0.1 10*3/uL — ABNORMAL HIGH (ref 0.00–0.07)
Basophils Absolute: 0 10*3/uL (ref 0.0–0.1)
Basophils Relative: 0 %
Eosinophils Absolute: 0.2 10*3/uL (ref 0.0–0.5)
Eosinophils Relative: 2 %
HCT: 30.9 % — ABNORMAL LOW (ref 36.0–46.0)
Hemoglobin: 9.5 g/dL — ABNORMAL LOW (ref 12.0–15.0)
Immature Granulocytes: 1 %
Lymphocytes Relative: 5 %
Lymphs Abs: 0.6 10*3/uL — ABNORMAL LOW (ref 0.7–4.0)
MCH: 31.9 pg (ref 26.0–34.0)
MCHC: 30.7 g/dL (ref 30.0–36.0)
MCV: 103.7 fL — ABNORMAL HIGH (ref 80.0–100.0)
Monocytes Absolute: 0.6 10*3/uL (ref 0.1–1.0)
Monocytes Relative: 5 %
Neutro Abs: 9.2 10*3/uL — ABNORMAL HIGH (ref 1.7–7.7)
Neutrophils Relative %: 87 %
Platelets: 168 10*3/uL (ref 150–400)
RBC: 2.98 MIL/uL — ABNORMAL LOW (ref 3.87–5.11)
RDW: 14.9 % (ref 11.5–15.5)
WBC: 10.7 10*3/uL — ABNORMAL HIGH (ref 4.0–10.5)
nRBC: 0 % (ref 0.0–0.2)

## 2021-06-27 LAB — RESPIRATORY PANEL BY PCR
Adenovirus: DETECTED — AB
Bordetella Parapertussis: NOT DETECTED
Bordetella pertussis: NOT DETECTED
Chlamydophila pneumoniae: NOT DETECTED
Coronavirus 229E: NOT DETECTED
Coronavirus HKU1: NOT DETECTED
Coronavirus NL63: NOT DETECTED
Coronavirus OC43: NOT DETECTED
Influenza A: NOT DETECTED
Influenza B: NOT DETECTED
Metapneumovirus: NOT DETECTED
Mycoplasma pneumoniae: NOT DETECTED
Parainfluenza Virus 1: NOT DETECTED
Parainfluenza Virus 2: NOT DETECTED
Parainfluenza Virus 3: NOT DETECTED
Parainfluenza Virus 4: NOT DETECTED
Respiratory Syncytial Virus: NOT DETECTED
Rhinovirus / Enterovirus: NOT DETECTED

## 2021-06-27 LAB — GLUCOSE, CAPILLARY
Glucose-Capillary: 109 mg/dL — ABNORMAL HIGH (ref 70–99)
Glucose-Capillary: 67 mg/dL — ABNORMAL LOW (ref 70–99)
Glucose-Capillary: 73 mg/dL (ref 70–99)
Glucose-Capillary: 80 mg/dL (ref 70–99)
Glucose-Capillary: 88 mg/dL (ref 70–99)

## 2021-06-27 LAB — GROUP A STREP BY PCR: Group A Strep by PCR: NOT DETECTED

## 2021-06-27 LAB — HEPATITIS B SURFACE ANTIBODY,QUALITATIVE: Hep B S Ab: REACTIVE — AB

## 2021-06-27 MED ORDER — ONDANSETRON 4 MG PO TBDP
4.0000 mg | ORAL_TABLET | Freq: Once | ORAL | Status: AC
  Administered 2021-06-27: 4 mg via ORAL
  Filled 2021-06-27: qty 1

## 2021-06-27 MED ORDER — DOXERCALCIFEROL 4 MCG/2ML IV SOLN
4.0000 ug | INTRAVENOUS | Status: DC
  Administered 2021-06-28: 4 ug via INTRAVENOUS
  Filled 2021-06-27 (×2): qty 2

## 2021-06-27 MED ORDER — ACETAMINOPHEN 500 MG PO TABS: 1000.0000 mg | ORAL_TABLET | Freq: Four times a day (QID) | ORAL | Status: DC

## 2021-06-27 MED ORDER — DARBEPOETIN ALFA 100 MCG/0.5ML IJ SOSY
100.0000 ug | PREFILLED_SYRINGE | INTRAMUSCULAR | Status: DC
  Administered 2021-06-28: 100 ug via INTRAVENOUS
  Filled 2021-06-27 (×2): qty 0.5

## 2021-06-27 MED ORDER — PREDNISONE 20 MG PO TABS
20.0000 mg | ORAL_TABLET | Freq: Every day | ORAL | Status: DC
Start: 2021-06-27 — End: 2021-06-29
  Administered 2021-06-27 – 2021-06-29 (×3): 20 mg via ORAL
  Filled 2021-06-27 (×3): qty 1

## 2021-06-27 MED ORDER — LABETALOL HCL 200 MG PO TABS
200.0000 mg | ORAL_TABLET | Freq: Three times a day (TID) | ORAL | Status: DC
  Administered 2021-06-27 – 2021-06-29 (×4): 200 mg via ORAL
  Filled 2021-06-27 (×5): qty 1

## 2021-06-27 MED ORDER — AZITHROMYCIN 500 MG PO TABS
500.0000 mg | ORAL_TABLET | Freq: Every day | ORAL | Status: AC
  Administered 2021-06-27 – 2021-06-29 (×3): 500 mg via ORAL
  Filled 2021-06-27 (×3): qty 1

## 2021-06-27 MED ORDER — IPRATROPIUM-ALBUTEROL 0.5-2.5 (3) MG/3ML IN SOLN
3.0000 mL | RESPIRATORY_TRACT | Status: DC
  Administered 2021-06-27 – 2021-06-28 (×3): 3 mL via RESPIRATORY_TRACT
  Filled 2021-06-27 (×3): qty 3

## 2021-06-27 MED ORDER — CHLORHEXIDINE GLUCONATE CLOTH 2 % EX PADS: 6.0000 | MEDICATED_PAD | Freq: Every day | CUTANEOUS | Status: DC

## 2021-06-27 MED ORDER — PROSOURCE PLUS PO LIQD
30.0000 mL | Freq: Two times a day (BID) | ORAL | Status: DC
  Administered 2021-06-27 – 2021-06-29 (×3): 30 mL via ORAL
  Filled 2021-06-27 (×4): qty 30

## 2021-06-27 MED ORDER — ACETAMINOPHEN 500 MG PO TABS
1000.0000 mg | ORAL_TABLET | Freq: Four times a day (QID) | ORAL | Status: DC | PRN
  Administered 2021-06-28 – 2021-06-29 (×2): 1000 mg via ORAL
  Filled 2021-06-27 (×2): qty 2

## 2021-06-27 NOTE — Progress Notes (Signed)
   06/27/21 1601  Assess: MEWS Score  Temp 99 F (37.2 C)  BP (!) 114/58  Pulse Rate (!) 140  Resp 20  SpO2 99 %  Assess: MEWS Score  MEWS Temp 0  MEWS Systolic 0  MEWS Pulse 3  MEWS RR 0  MEWS LOC 0  MEWS Score 3  MEWS Score Color Yellow  Assess: if the MEWS score is Yellow or Red  Were vital signs taken at a resting state? Yes  Focused Assessment Change from prior assessment (see assessment flowsheet)  Early Detection of Sepsis Score *See Row Information* High  MEWS guidelines implemented *See Row Information* Yes  Treat  MEWS Interventions Escalated (See documentation below)  Pain Scale 0-10  Pain Score 0  Take Vital Signs  Increase Vital Sign Frequency  Yellow: Q 2hr X 2 then Q 4hr X 2, if remains yellow, continue Q 4hrs  Escalate  MEWS: Escalate Yellow: discuss with charge nurse/RN and consider discussing with provider and RRT  Notify: Charge Nurse/RN  Name of Charge Nurse/RN Notified Candice RN  Date Charge Nurse/RN Notified 06/27/21  Time Charge Nurse/RN Notified 1629  Notify: Provider  Provider Name/Title Dr. Coy Saunas  Date Provider Notified 06/27/21  Time Provider Notified 1615  Method of Notification Page  Notification Reason Other (Comment) (HR)  Provider response See new orders  Date of Provider Response 06/27/21  Time of Provider Response 1615  Document  Patient Outcome Other (Comment) (Obtaining EKG, awaiting other orders)  Progress note created (see row info) Yes

## 2021-06-27 NOTE — Plan of Care (Signed)
  Problem: Health Behavior/Discharge Planning: Goal: Ability to manage health-related needs will improve Outcome: Not Progressing   Problem: Clinical Measurements: Goal: Respiratory complications will improve Outcome: Not Progressing   Problem: Nutrition: Goal: Adequate nutrition will be maintained Outcome: Not Progressing   

## 2021-06-27 NOTE — Progress Notes (Addendum)
[facility] Progress Note   Subjective:   Patient seen and examined at bedside.  Reports feeling terrible.  Admits to fever, chills, sore throat, weakness and fatigue.  Most bothersome is productive cough.  Shortness of breath improving.  Denies CP, n/v/d and abdominal pain.   Objective Vitals:   06/26/21 2249 06/26/21 2249 06/26/21 2350 06/27/21 0834  BP: (!) 158/77 (!) 158/77  (!) 135/52  Pulse: 96 97  91  Resp: 18 18  15   Temp:  (!) 102.7 F (39.3 C) 99.3 F (37.4 C) (!) 102.7 F (39.3 C)  TempSrc:  Oral  Oral  SpO2: 99% 100%  97%  Weight:      Height:       Physical Exam General:chronically ill appearing female in NAD Heart:RRR, +7/3 systolic murmur Lungs:+crackles in bases, scattered rhonchi Abdomen:soft, NTND Extremities:no LE edema Dialysis Access: LU AVF +b/t   Filed Weights   06/26/21 0629 06/26/21 1845 06/26/21 2230  Weight: 65.8 kg 65.8 kg 63.5 kg    Intake/Output Summary (Last 24 hours) at 06/27/2021 1230 Last data filed at 06/27/2021 0700 Gross per 24 hour  Intake 340 ml  Output 2365 ml  Net -2025 ml    Additional Objective Labs: Basic Metabolic Panel: Recent Labs  Lab 06/26/21 0650 06/26/21 2318  NA 135 132*  K 4.5 4.0  CL 98 98  CO2 23 25  GLUCOSE 88 62*  BUN 39* 17  CREATININE 7.94* 4.43*  CALCIUM 7.6* 8.5*  PHOS  --  3.7   Liver Function Tests: Recent Labs  Lab 06/26/21 0650 06/26/21 2318  AST 18  --   ALT 12  --   ALKPHOS 206*  --   BILITOT 0.5  --   PROT 6.4*  --   ALBUMIN 3.1* 2.7*   CBC: Recent Labs  Lab 06/26/21 0650 06/26/21 2318 06/27/21 0250  WBC 5.8 10.6* 10.7*  NEUTROABS 4.6  --  9.2*  HGB 9.1* 9.9* 9.5*  HCT 30.2* 31.7* 30.9*  MCV 105.6* 102.6* 103.7*  PLT 182 158 168   Blood Culture    Component Value Date/Time   SDES BLOOD SITE NOT SPECIFIED 06/26/2021 0650   SPECREQUEST  06/26/2021 0650    BOTTLES DRAWN AEROBIC AND ANAEROBIC Blood Culture results may not be optimal due to an excessive  volume of blood received in culture bottles   CULT  06/26/2021 0650    NO GROWTH < 24 HOURS Performed at [facility], [address] [place], [place] [address]    REPTSTATUS PENDING 06/26/2021 0650   CBG: Recent Labs  Lab 06/26/21 2248 06/26/21 2359 06/27/21 0719 06/27/21 0757 06/27/21 1134  GLUCAP 67* 80 67* 73 88    Studies/Results: DG CHEST PORT 1 VIEW  Result Date: 06/27/2021 CLINICAL DATA:  Shortness of breath EXAM: PORTABLE CHEST 1 VIEW COMPARISON:  06/26/2021 FINDINGS: Bilateral diffuse interstitial thickening with right infrahilar airspace disease. No pleural effusion or pneumothorax. Stable cardiomegaly. Prior median sternotomy. Prior CABG. No acute osseous abnormality. IMPRESSION: 1. Findings concerning for mild CHF. Right infrahilar airspace disease which may reflect atelectasis versus pneumonia. Electronically Signed   By: Kathreen Devoid M.D.   On: 06/27/2021 10:31   DG Chest Port 1 View  Result Date: 06/26/2021 CLINICAL DATA:  SOB, dialysis patient, elevated BP EXAM: PORTABLE CHEST - 1 VIEW COMPARISON:  02/13/2021 FINDINGS: Some worsening of diffuse interstitial opacities and marked central pulmonary vascular congestion. Coarse atelectasis or scarring at the left lung base. Stable cardiomegaly. CABG  marker and sternotomy wires. Aortic Atherosclerosis (ICD10-170.0). No effusion. Cervical fixation hardware partially visualized. IMPRESSION: Worsening pulmonary vascular congestion/edema. Stable cardiomegaly Electronically Signed   By: Lucrezia Europe M.D.   On: 06/26/2021 07:00   ECHOCARDIOGRAM COMPLETE  Result Date: 06/26/2021    ECHOCARDIOGRAM REPORT   Patient Name:   Jodi Bell Date of Exam: 06/26/2021 Medical Rec #:  [id number]        Height:       62.0 in Accession #:    [id number]       Weight:       145.1 lb Date of Birth:  01/16/1961         BSA:          1.668 m Patient Age:    61 years         BP:           135/59 mmHg Patient Gender: F                HR:            87 bpm. Exam Location:  Inpatient Procedure: 2D Echo, Cardiac Doppler and Color Doppler Indications:    Dyspnea R06.00  History:        Patient has prior history of Echocardiogram examinations, most                 recent 01/02/2021. COPD, Signs/Symptoms:Dyspnea; Risk                 Factors:Diabetes, Hypertension, GERD, Sleep Apnea and Current                 Smoker.  Sonographer:    Bernadene Person RDCS Referring Phys: [id number] [place]  1. Left ventricular ejection fraction, by estimation, is 70 to 75%. The left ventricle has hyperdynamic function. The left ventricle has no regional wall motion abnormalities. There is severe left ventricular hypertrophy. Left ventricular diastolic parameters are consistent with Grade II diastolic dysfunction (pseudonormalization). Elevated left ventricular end-diastolic pressure.  2. Right ventricular systolic function is normal. The right ventricular size is normal. There is normal pulmonary artery systolic pressure.  3. Left atrial size was severely dilated.  4. Right atrial size was mildly dilated.  5. The mitral valve is degenerative. Mild mitral valve regurgitation. No evidence of mitral stenosis. Severe mitral annular calcification.  6. Tricuspid valve regurgitation is mild to moderate.  7. Visually, by planimetry, and by DI aortic valve has moderate stenosis. However, peak velocity >4, suggesting severe stenosis. This does not appear to be explained by LVOT gradient/obstruction. The aortic valve is tricuspid. There is moderate calcification of the aortic valve. Aortic valve regurgitation is not visualized. Moderate to severe aortic valve stenosis.  8. The inferior vena cava is dilated in size with <50% respiratory variability, suggesting right atrial pressure of 15 mmHg. Comparison(s): Changes from prior study are noted. Moderate to severe aortic stenosis; peak velocity >4, but by visual/planimetry/DI suggests moderate. FINDINGS  Left Ventricle: Left  ventricular ejection fraction, by estimation, is 70 to 75%. The left ventricle has hyperdynamic function. The left ventricle has no regional wall motion abnormalities. The left ventricular internal cavity size was normal in size. There is severe left ventricular hypertrophy. Left ventricular diastolic parameters are consistent with Grade II diastolic dysfunction (pseudonormalization). Elevated left ventricular end-diastolic pressure. Right Ventricle: The right ventricular size is normal. Right vetricular wall thickness was not well visualized. Right ventricular systolic function is normal. There is normal pulmonary artery  systolic pressure. The tricuspid regurgitant velocity is 2.58 m/s, and with an assumed right atrial pressure of 8 mmHg, the estimated right ventricular systolic pressure is 06.2 mmHg. Left Atrium: Left atrial size was severely dilated. Right Atrium: Right atrial size was mildly dilated. Pericardium: Trivial pericardial effusion is present. Mitral Valve: The mitral valve is degenerative in appearance. Severe mitral annular calcification. Mild mitral valve regurgitation. No evidence of mitral valve stenosis. Tricuspid Valve: The tricuspid valve is normal in structure. Tricuspid valve regurgitation is mild to moderate. No evidence of tricuspid stenosis. Aortic Valve: Visually, by planimetry, and by DI aortic valve has moderate stenosis. However, peak velocity >4, suggesting severe stenosis. This does not appear to be explained by LVOT gradient/obstruction. The aortic valve is tricuspid. There is moderate calcification of the aortic valve. Aortic valve regurgitation is not visualized. Moderate to severe aortic stenosis is present. Aortic valve mean gradient measures 41.3 mmHg. Aortic valve peak gradient measures 73.2 mmHg. Aortic valve area, by VTI measures 1.27 cm. Pulmonic Valve: The pulmonic valve was not well visualized. Pulmonic valve regurgitation is not visualized. No evidence of pulmonic  stenosis. Aorta: The aortic root, ascending aorta, aortic arch and descending aorta are all structurally normal, with no evidence of dilitation or obstruction. Venous: The inferior vena cava is dilated in size with less than 50% respiratory variability, suggesting right atrial pressure of 15 mmHg. IAS/Shunts: The atrial septum is grossly normal. Additional Comments: There is a small pleural effusion in both left and right lateral regions.  LEFT VENTRICLE PLAX 2D LVIDd:         3.66 cm      Diastology LVIDs:         2.08 cm      LV e' medial:    4.60 cm/s LV PW:         1.77 cm      LV E/e' medial:  39.8 LV IVS:        2.11 cm      LV e' lateral:   7.42 cm/s LVOT diam:     2.00 cm      LV E/e' lateral: 24.7 LV SV:         108 LV SV Index:   65 LVOT Area:     3.14 cm  LV Volumes (MOD) LV vol d, MOD A2C: 101.0 ml LV vol d, MOD A4C: 133.0 ml LV vol s, MOD A2C: 28.2 ml LV vol s, MOD A4C: 47.9 ml LV SV MOD A2C:     72.8 ml LV SV MOD A4C:     133.0 ml LV SV MOD BP:      83.7 ml RIGHT VENTRICLE RV S prime:     11.30 cm/s TAPSE (M-mode): 1.8 cm LEFT ATRIUM             Index        RIGHT ATRIUM           Index LA diam:        4.60 cm 2.76 cm/m   RA Area:     18.20 cm LA Vol (A2C):   92.3 ml 55.34 ml/m  RA Volume:   44.70 ml  26.80 ml/m LA Vol (A4C):   86.8 ml 52.04 ml/m LA Biplane Vol: 91.0 ml 54.56 ml/m  AORTIC VALVE AV Area (Vmax):    1.28 cm AV Area (Vmean):   1.25 cm AV Area (VTI):     1.27 cm AV Vmax:  427.67 cm/s AV Vmean:          301.333 cm/s AV VTI:            0.852 m AV Peak Grad:      73.2 mmHg AV Mean Grad:      41.3 mmHg LVOT Vmax:         174.00 cm/s LVOT Vmean:        120.000 cm/s LVOT VTI:          0.343 m LVOT/AV VTI ratio: 0.40  AORTA Ao Root diam: 2.90 cm Ao Asc diam:  3.70 cm MITRAL VALVE                TRICUSPID VALVE MV Area (PHT): 2.73 cm     TR Peak grad:   26.6 mmHg MV Decel Time: 278 msec     TR Vmax:        258.00 cm/s MR PISA:        1.57 cm MR PISA Radius: 0.50 cm     SHUNTS  MV E velocity: 183.00 cm/s  Systemic VTI:  0.34 m MV A velocity: 116.00 cm/s  Systemic Diam: 2.00 cm MV E/A ratio:  1.58 Buford Dresser MD Electronically signed by Buford Dresser MD Signature Date/Time: 06/26/2021/8:40:43 PM    Final     Medications:  sodium chloride     sodium chloride      acetaminophen  1,000 mg Oral Q6H   acidophilus  1 capsule Oral Daily   allopurinol  100 mg Oral QPM   amLODipine  10 mg Oral Daily   calcium acetate  1,334 mg Oral BID WC   Chlorhexidine Gluconate Cloth  6 each Topical Q0600   cinacalcet  30 mg Oral Q breakfast   cloNIDine  0.1 mg Oral TID   heparin  5,000 Units Subcutaneous Q8H   irbesartan  300 mg Oral Daily   isosorbide mononitrate  30 mg Oral Daily   montelukast  10 mg Oral Q supper   multivitamin  1 tablet Oral QHS   pantoprazole  40 mg Oral Daily   umeclidinium bromide   Inhalation Daily    Dialysis Orders: MWF  - [facility]  3.5hrs, BFR 400, DFR 600,  EDW 62kg, 2K/ 2.5Ca   Access: LU AVF  Heparin none EPO 12100 qHD Hectorol 64mcg IV qHD     Assessment/Plan:  Pulmonary edema/volume overload- noted on CXR on admit.  HD yesterday with net UF 2.3L.  Improving on CXR completed today. Fever/?PNA- +adenovirus.  CXR today with likely R sided airspace disease suspicious for PNA. per PMD  ESRD -  on HD MWF, HD tomorrow per regular schedule.   Hypertension - Blood pressure close to goal. Continue home meds: amlodipine 10, clonidine 0.1 TID, olmesartan 40 qd, hydralazine 100mg  TID, imdur 30mg  qd.  Hold prior to HD.    Anemia of CKD - Hgb 9.5, on EPO TIW, will consult pharm for ESA.  Secondary Hyperparathyroidism -  CCa and phos in goal. On increased Ca bath, continue VDRA.   Nutrition - Renal diet w/fluid restrictions.  Chronic respiratory failure w/hypoxia/Asthma/OSA- on 3L at baseline 9. DMT2 - diet controlled.   Jen Mow, PA-C [facility] 06/27/2021,12:30 PM  LOS: 1 day

## 2021-06-27 NOTE — Progress Notes (Signed)
Patient arrived form HD, initial vitals shows yellow MEWS, dull bath given due to bowel movement then notified charge nurse and paged MD [place], tylenol cannot be given at one time dose was given at 2124. Per Dr Raymondo Band to re-check temperature later. Temperature rechecked after an hour and showing 99.3 orally.     06/26/21 2249  Assess: MEWS Score  Temp (!) 102.7 F (39.3 C)  BP (!) 158/77  Pulse Rate 97  Resp 18  SpO2 100 %  O2 Device Room Air  Assess: MEWS Score  MEWS Temp 2  MEWS Systolic 0  MEWS Pulse 0  MEWS RR 0  MEWS LOC 0  MEWS Score 2  MEWS Score Color Yellow  Assess: if the MEWS score is Yellow or Red  Were vital signs taken at a resting state? Yes  Focused Assessment No change from prior assessment  Early Detection of Sepsis Score *See Row Information* High  MEWS guidelines implemented *See Row Information* No, vital signs rechecked  Treat  MEWS Interventions Other (Comment) (tylenol already given)  Pain Scale 0-10  Pain Score 0  Take Vital Signs  Increase Vital Sign Frequency   (re-assessment)  Escalate  MEWS: Escalate Yellow: discuss with charge nurse/RN and consider discussing with provider and RRT  Notify: Charge Nurse/RN  Name of Charge Nurse/RN Notified Jequetta RN  Date Charge Nurse/RN Notified 06/26/21  Time Charge Nurse/RN Notified 2255  Notify: Provider  Provider Name/Title dr Buddy Duty  Date Provider Notified 06/26/21  Time Provider Notified 2300  Method of Notification Page  Notification Reason  (yellow mews)  Provider response Evaluate remotely  Date of Provider Response 06/26/21  Time of Provider Response 2312

## 2021-06-27 NOTE — Progress Notes (Signed)
Subjective:  Overnight:NAEON  States she is doing well. No acute complaints endorsed.   Objective: Vital signs in last 24 hours: febrile at 102 at 10 pm but afebrile since then. Hypertensive 150s/70s. Most recent 130s/50s. Satting well on 3 L.   Vitals:   06/26/21 2230 06/26/21 2249 06/26/21 2249 06/26/21 2350  BP: 120/62 (!) 158/77 (!) 158/77   Pulse: 82 96 97   Resp: 20 18 18    Temp: 98.9 F (37.2 C)  (!) 102.7 F (39.3 C) 99.3 F (37.4 C)  TempSrc: Oral  Oral   SpO2:  99% 100%   Weight: 63.5 kg     Height:       Supplemental O2: Nasal Cannula SpO2: 100 % O2 Flow Rate (L/min): 3 L/min Filed Weights   06/26/21 0629 06/26/21 1845 06/26/21 2230  Weight: 65.8 kg 65.8 kg 63.5 kg    Intake/Output Summary (Last 24 hours) at 06/27/2021 2703 Last data filed at 06/27/2021 0645 Gross per 24 hour  Intake 120 ml  Output 2365 ml  Net -2245 ml   Net IO Since Admission: -2,245 mL [06/27/21 0651]  Physical Exam General: NAD laying in bed comfortably HENT: NCAT, on [place] Lungs: mild expiratory wheeze present, coarse sounds present diffusely, bibasilar rales present Cardiovascular: Normal sinus rate and rhythm present, systolic murmur present, no lower extremity edema Abdomen: No TTP, normal bowel sounds MSK: no asymmetry Skin: no lesions on exposed skin Neuro: alert and oriented Psych: normal mood and normal affect  Diagnostics CBC shows leukocytosis at 10.7. On admission at 5.8. Hgb 9.5 and MCV 103.7. Na 132, K 4.0, Calcium 8.5 and Albumin 2.7.  Resp panel positive for adenovirus. Echo shows EF 70 %, severe LVH and severe LA dilation, mild right atrial dilation. G2DD, Degeneration of mitral valve due to annular calcification, mild mitral and tricuspid valve regurg. Mod to severe aortic stenosis.  Assessment/Plan: Jodi Bell is a 61 y.o. with COPD on 3 L supplemental oxygen at baseline , ESRD on HD MWF, and HTN who presented with dyspnea, fever, and sore throat found to  have rhinovirus concerning for viral induced COPD exacerbation.   COPD Exacerbation 2/2 URI Hx of chronic respiratory failure with hypoxia, COPD, Asthma, OSA not on CPAP Stable to improving. Patient on 3 L [place] at here and at home. RVP positive for adenovirus. NGTD on blood cultures. Appears viral infection resulted in copd exacerbation. Recent CXR shows some concern for worsening atelectasis vs pneumonia. Will start prednisone 40 mg x5 days and azithromycin 500 mg x3 days.  - Continue steroids and azithro as above - q4 duonebs, incruse daily, continue home montelukast  - CTM for sign or symptoms of infection  #Volume overload in setting of ESRD on HD #Moderate Aortic Stenosis and Mitral Stenosis #Hx of HTN SBP to 180's on admission. Home amlodipine given in ED before admission. Home medication regimen: amlodipine 10, clonidine .1 TID, Olmesartan 40 mg ( substitute Irbesartan on our formulary), hydralazine 100 mg q8hrs, Imdur 30 mg q 24 hours. HD yesterday with 2.4 L removed. Weight 2.3 less than admission weight.  - Continue amlodipine, clonidine, Olmesartan , and Imdur.  - Continue home HD schedule. Plan for HD tomorrow.   #Hx of T2DM Hemoglobin a1c 5.2 09/13/2020. Diet controlled. Had CBG of 67, so we will discontinue SSI.  -CTM    #Anxiety Chronic. continue home medication, Ativan 1 mg prn q8hrs    #Chronic back pain Chronic. Continue home PRN Oxycodone 10 mg  BID   #  Insomnia Chronic. Continue home Ambien qhs  Diet: Renal IVF: None VTE: Heparin Code: Full Prior to Admission Living Arrangement: Home Anticipated Discharge Location: Home Barriers to Discharge: Medical Workup Dispo: Anticipated discharge in approximately 1 day(s).   Idamae Schuller, MD Tillie Rung. [facility] Internal Medicine Residency, PGY-1  Pager: [phone] After 5 pm and on weekends: Please call the on-call pager

## 2021-06-27 NOTE — Progress Notes (Signed)
Hypoglycemic Event  CBG: 67  Treatment: 4 oz juice/soda  Symptoms: None  Follow-up CBG: Time:0757 CBG Result:73  Possible Reasons for Event: Inadequate meal intake  Comments/MD notified:Followed hypoglycemic protocol    Jodi Bell

## 2021-06-28 DIAGNOSIS — E8770 Fluid overload, unspecified: Principal | ICD-10-CM

## 2021-06-28 LAB — CBC WITH DIFFERENTIAL/PLATELET
Abs Immature Granulocytes: 0.05 10*3/uL (ref 0.00–0.07)
Basophils Absolute: 0 10*3/uL (ref 0.0–0.1)
Basophils Relative: 0 %
Eosinophils Absolute: 0 10*3/uL (ref 0.0–0.5)
Eosinophils Relative: 0 %
HCT: 27.5 % — ABNORMAL LOW (ref 36.0–46.0)
Hemoglobin: 8.5 g/dL — ABNORMAL LOW (ref 12.0–15.0)
Immature Granulocytes: 1 %
Lymphocytes Relative: 4 %
Lymphs Abs: 0.3 10*3/uL — ABNORMAL LOW (ref 0.7–4.0)
MCH: 32.1 pg (ref 26.0–34.0)
MCHC: 30.9 g/dL (ref 30.0–36.0)
MCV: 103.8 fL — ABNORMAL HIGH (ref 80.0–100.0)
Monocytes Absolute: 0.3 10*3/uL (ref 0.1–1.0)
Monocytes Relative: 3 %
Neutro Abs: 7.1 10*3/uL (ref 1.7–7.7)
Neutrophils Relative %: 92 %
Platelets: 154 10*3/uL (ref 150–400)
RBC: 2.65 MIL/uL — ABNORMAL LOW (ref 3.87–5.11)
RDW: 14.8 % (ref 11.5–15.5)
WBC: 7.8 10*3/uL (ref 4.0–10.5)
nRBC: 0 % (ref 0.0–0.2)

## 2021-06-28 LAB — RENAL FUNCTION PANEL
Albumin: 2.4 g/dL — ABNORMAL LOW (ref 3.5–5.0)
Anion gap: 13 (ref 5–15)
BUN: 43 mg/dL — ABNORMAL HIGH (ref 8–23)
CO2: 25 mmol/L (ref 22–32)
Calcium: 8.2 mg/dL — ABNORMAL LOW (ref 8.9–10.3)
Chloride: 95 mmol/L — ABNORMAL LOW (ref 98–111)
Creatinine, Ser: 7.59 mg/dL — ABNORMAL HIGH (ref 0.44–1.00)
GFR, Estimated: 6 mL/min — ABNORMAL LOW (ref >60)
Glucose, Bld: 108 mg/dL — ABNORMAL HIGH (ref 70–99)
Phosphorus: 5.3 mg/dL — ABNORMAL HIGH (ref 2.5–4.6)
Potassium: 4.9 mmol/L (ref 3.5–5.1)
Sodium: 133 mmol/L — ABNORMAL LOW (ref 135–145)

## 2021-06-28 LAB — GLUCOSE, CAPILLARY
Glucose-Capillary: 140 mg/dL — ABNORMAL HIGH (ref 70–99)
Glucose-Capillary: 81 mg/dL (ref 70–99)
Glucose-Capillary: 81 mg/dL (ref 70–99)
Glucose-Capillary: 112 mg/dL — ABNORMAL HIGH (ref 70–99)
Glucose-Capillary: 256 mg/dL — ABNORMAL HIGH (ref 70–99)

## 2021-06-28 LAB — HEPATITIS B SURFACE ANTIBODY, QUANTITATIVE: Hep B S AB Quant (Post): 13.5 m[IU]/mL (ref >9.9)

## 2021-06-28 MED ORDER — HEPARIN SODIUM (PORCINE) 1000 UNIT/ML DIALYSIS: 1000.0000 [IU] | INTRAMUSCULAR | Status: DC | PRN

## 2021-06-28 MED ORDER — LIDOCAINE HCL (PF) 1 % IJ SOLN: 5.0000 mL | INTRAMUSCULAR | Status: DC | PRN

## 2021-06-28 MED ORDER — LIDOCAINE-PRILOCAINE 2.5-2.5 % EX CREA: 1.0000 "application " | TOPICAL_CREAM | CUTANEOUS | Status: DC | PRN

## 2021-06-28 MED ORDER — SODIUM CHLORIDE 0.9 % IV SOLN: 100.0000 mL | INTRAVENOUS | Status: DC | PRN

## 2021-06-28 MED ORDER — PENTAFLUOROPROP-TETRAFLUOROETH EX AERO: 1.0000 "application " | INHALATION_SPRAY | CUTANEOUS | Status: DC | PRN

## 2021-06-28 MED ORDER — IPRATROPIUM-ALBUTEROL 0.5-2.5 (3) MG/3ML IN SOLN
3.0000 mL | Freq: Four times a day (QID) | RESPIRATORY_TRACT | Status: DC
  Administered 2021-06-28 (×2): 3 mL via RESPIRATORY_TRACT
  Filled 2021-06-28 (×2): qty 3

## 2021-06-28 MED ORDER — NEPRO/CARBSTEADY PO LIQD: 237.0000 mL | Freq: Two times a day (BID) | ORAL | Status: DC

## 2021-06-28 MED ORDER — ALTEPLASE 2 MG IJ SOLR: 2.0000 mg | Freq: Once | INTRAMUSCULAR | Status: DC | PRN

## 2021-06-28 MED ORDER — IPRATROPIUM-ALBUTEROL 0.5-2.5 (3) MG/3ML IN SOLN
3.0000 mL | Freq: Three times a day (TID) | RESPIRATORY_TRACT | Status: DC
  Administered 2021-06-28 – 2021-06-29 (×2): 3 mL via RESPIRATORY_TRACT
  Filled 2021-06-28 (×2): qty 3

## 2021-06-28 MED ORDER — GUAIFENESIN ER 600 MG PO TB12
1200.0000 mg | ORAL_TABLET | Freq: Two times a day (BID) | ORAL | Status: DC
  Administered 2021-06-28 – 2021-06-29 (×3): 1200 mg via ORAL
  Filled 2021-06-28 (×3): qty 2

## 2021-06-28 NOTE — Progress Notes (Signed)
[facility] Progress Note   Subjective:   Seen on HD, tolerating well so far. Reports some mild abdominal pain this AM but no nausea, vomiting or constipation. Still coughing but feels her cough is improving, no SOB today.   Objective Vitals:   06/28/21 0700 06/28/21 0846 06/28/21 0900 06/28/21 0930  BP:  (!) 124/44 124/63 (!) 147/70  Pulse:  77    Resp:  (!) 21 17 (!) 25  Temp:  98.6 F (37 C)    TempSrc:  Temporal    SpO2: (!) 89% 100%    Weight:  61.3 kg    Height:       Physical Exam General: WDWN female, alert and in NAD Heart: RRR, no murmurs, rubs or gallops Lungs: + crackles LLL, CTA on the right. Respirations unlabored Abdomen: Soft, non-distended, +BS Extremities: No edema bl lower extremities Dialysis Access: LUE AVF + T/b  Additional Objective Labs: Basic Metabolic Panel: Recent Labs  Lab 06/26/21 0650 06/26/21 2318 06/28/21 0905  NA 135 132* 133*  K 4.5 4.0 PENDING  CL 98 98 95*  CO2 23 25 25   GLUCOSE 88 62* 108*  BUN 39* 17 PENDING  CREATININE 7.94* 4.43* PENDING  CALCIUM 7.6* 8.5* 8.2*  PHOS  --  3.7 5.3*   Liver Function Tests: Recent Labs  Lab 06/26/21 0650 06/26/21 2318 06/28/21 0905  AST 18  --   --   ALT 12  --   --   ALKPHOS 206*  --   --   BILITOT 0.5  --   --   PROT 6.4*  --   --   ALBUMIN 3.1* 2.7* 2.4*   No results for input(s): LIPASE, AMYLASE in the last 168 hours. CBC: Recent Labs  Lab 06/26/21 0650 06/26/21 2318 06/27/21 0250 06/28/21 0346  WBC 5.8 10.6* 10.7* 7.8  NEUTROABS 4.6  --  9.2* 7.1  HGB 9.1* 9.9* 9.5* 8.5*  HCT 30.2* 31.7* 30.9* 27.5*  MCV 105.6* 102.6* 103.7* 103.8*  PLT 182 158 168 154   Blood Culture    Component Value Date/Time   SDES BLOOD SITE NOT SPECIFIED 06/26/2021 0650   SPECREQUEST  06/26/2021 0650    BOTTLES DRAWN AEROBIC AND ANAEROBIC Blood Culture results may not be optimal due to an excessive volume of blood received in culture bottles   CULT  06/26/2021 0650    NO  GROWTH < 24 HOURS Performed at [facility], [place] [address], [place], [place] [address]    REPTSTATUS PENDING 06/26/2021 0626    Cardiac Enzymes: No results for input(s): CKTOTAL, CKMB, CKMBINDEX, TROPONINI in the last 168 hours. CBG: Recent Labs  Lab 06/27/21 0757 06/27/21 1134 06/27/21 1649 06/28/21 0104 06/28/21 0729  GLUCAP 73 88 109* 140* 81   Iron Studies: No results for input(s): IRON, TIBC, TRANSFERRIN, FERRITIN in the last 72 hours. @lablastinr3 @ Studies/Results: DG CHEST PORT 1 VIEW  Result Date: 06/27/2021 CLINICAL DATA:  Shortness of breath EXAM: PORTABLE CHEST 1 VIEW COMPARISON:  06/26/2021 FINDINGS: Bilateral diffuse interstitial thickening with right infrahilar airspace disease. No pleural effusion or pneumothorax. Stable cardiomegaly. Prior median sternotomy. Prior CABG. No acute osseous abnormality. IMPRESSION: 1. Findings concerning for mild CHF. Right infrahilar airspace disease which may reflect atelectasis versus pneumonia. Electronically Signed   By: Kathreen Devoid M.D.   On: 06/27/2021 10:31   ECHOCARDIOGRAM COMPLETE  Result Date: 06/26/2021    ECHOCARDIOGRAM REPORT   Patient Name:   Jodi Bell Date of Exam: 06/26/2021  Medical Rec #:  [id number]        Height:       62.0 in Accession #:    [id number]       Weight:       145.1 lb Date of Birth:  1960/08/12         BSA:          1.668 m Patient Age:    61 years         BP:           135/59 mmHg Patient Gender: F                HR:           87 bpm. Exam Location:  Inpatient Procedure: 2D Echo, Cardiac Doppler and Color Doppler Indications:    Dyspnea R06.00  History:        Patient has prior history of Echocardiogram examinations, most                 recent 01/02/2021. COPD, Signs/Symptoms:Dyspnea; Risk                 Factors:Diabetes, Hypertension, GERD, Sleep Apnea and Current                 Smoker.  Sonographer:    Bernadene Person RDCS Referring Phys: [id number] [place]  1. Left ventricular  ejection fraction, by estimation, is 70 to 75%. The left ventricle has hyperdynamic function. The left ventricle has no regional wall motion abnormalities. There is severe left ventricular hypertrophy. Left ventricular diastolic parameters are consistent with Grade II diastolic dysfunction (pseudonormalization). Elevated left ventricular end-diastolic pressure.  2. Right ventricular systolic function is normal. The right ventricular size is normal. There is normal pulmonary artery systolic pressure.  3. Left atrial size was severely dilated.  4. Right atrial size was mildly dilated.  5. The mitral valve is degenerative. Mild mitral valve regurgitation. No evidence of mitral stenosis. Severe mitral annular calcification.  6. Tricuspid valve regurgitation is mild to moderate.  7. Visually, by planimetry, and by DI aortic valve has moderate stenosis. However, peak velocity >4, suggesting severe stenosis. This does not appear to be explained by LVOT gradient/obstruction. The aortic valve is tricuspid. There is moderate calcification of the aortic valve. Aortic valve regurgitation is not visualized. Moderate to severe aortic valve stenosis.  8. The inferior vena cava is dilated in size with <50% respiratory variability, suggesting right atrial pressure of 15 mmHg. Comparison(s): Changes from prior study are noted. Moderate to severe aortic stenosis; peak velocity >4, but by visual/planimetry/DI suggests moderate. FINDINGS  Left Ventricle: Left ventricular ejection fraction, by estimation, is 70 to 75%. The left ventricle has hyperdynamic function. The left ventricle has no regional wall motion abnormalities. The left ventricular internal cavity size was normal in size. There is severe left ventricular hypertrophy. Left ventricular diastolic parameters are consistent with Grade II diastolic dysfunction (pseudonormalization). Elevated left ventricular end-diastolic pressure. Right Ventricle: The right ventricular size is  normal. Right vetricular wall thickness was not well visualized. Right ventricular systolic function is normal. There is normal pulmonary artery systolic pressure. The tricuspid regurgitant velocity is 2.58 m/s, and with an assumed right atrial pressure of 8 mmHg, the estimated right ventricular systolic pressure is 26.8 mmHg. Left Atrium: Left atrial size was severely dilated. Right Atrium: Right atrial size was mildly dilated. Pericardium: Trivial pericardial effusion is present. Mitral Valve: The mitral valve is degenerative in appearance. Severe mitral  annular calcification. Mild mitral valve regurgitation. No evidence of mitral valve stenosis. Tricuspid Valve: The tricuspid valve is normal in structure. Tricuspid valve regurgitation is mild to moderate. No evidence of tricuspid stenosis. Aortic Valve: Visually, by planimetry, and by DI aortic valve has moderate stenosis. However, peak velocity >4, suggesting severe stenosis. This does not appear to be explained by LVOT gradient/obstruction. The aortic valve is tricuspid. There is moderate calcification of the aortic valve. Aortic valve regurgitation is not visualized. Moderate to severe aortic stenosis is present. Aortic valve mean gradient measures 41.3 mmHg. Aortic valve peak gradient measures 73.2 mmHg. Aortic valve area, by VTI measures 1.27 cm. Pulmonic Valve: The pulmonic valve was not well visualized. Pulmonic valve regurgitation is not visualized. No evidence of pulmonic stenosis. Aorta: The aortic root, ascending aorta, aortic arch and descending aorta are all structurally normal, with no evidence of dilitation or obstruction. Venous: The inferior vena cava is dilated in size with less than 50% respiratory variability, suggesting right atrial pressure of 15 mmHg. IAS/Shunts: The atrial septum is grossly normal. Additional Comments: There is a small pleural effusion in both left and right lateral regions.  LEFT VENTRICLE PLAX 2D LVIDd:         3.66 cm       Diastology LVIDs:         2.08 cm      LV e' medial:    4.60 cm/s LV PW:         1.77 cm      LV E/e' medial:  39.8 LV IVS:        2.11 cm      LV e' lateral:   7.42 cm/s LVOT diam:     2.00 cm      LV E/e' lateral: 24.7 LV SV:         108 LV SV Index:   65 LVOT Area:     3.14 cm  LV Volumes (MOD) LV vol d, MOD A2C: 101.0 ml LV vol d, MOD A4C: 133.0 ml LV vol s, MOD A2C: 28.2 ml LV vol s, MOD A4C: 47.9 ml LV SV MOD A2C:     72.8 ml LV SV MOD A4C:     133.0 ml LV SV MOD BP:      83.7 ml RIGHT VENTRICLE RV S prime:     11.30 cm/s TAPSE (M-mode): 1.8 cm LEFT ATRIUM             Index        RIGHT ATRIUM           Index LA diam:        4.60 cm 2.76 cm/m   RA Area:     18.20 cm LA Vol (A2C):   92.3 ml 55.34 ml/m  RA Volume:   44.70 ml  26.80 ml/m LA Vol (A4C):   86.8 ml 52.04 ml/m LA Biplane Vol: 91.0 ml 54.56 ml/m  AORTIC VALVE AV Area (Vmax):    1.28 cm AV Area (Vmean):   1.25 cm AV Area (VTI):     1.27 cm AV Vmax:           427.67 cm/s AV Vmean:          301.333 cm/s AV VTI:            0.852 m AV Peak Grad:      73.2 mmHg AV Mean Grad:      41.3 mmHg LVOT Vmax:  174.00 cm/s LVOT Vmean:        120.000 cm/s LVOT VTI:          0.343 m LVOT/AV VTI ratio: 0.40  AORTA Ao Root diam: 2.90 cm Ao Asc diam:  3.70 cm MITRAL VALVE                TRICUSPID VALVE MV Area (PHT): 2.73 cm     TR Peak grad:   26.6 mmHg MV Decel Time: 278 msec     TR Vmax:        258.00 cm/s MR PISA:        1.57 cm MR PISA Radius: 0.50 cm     SHUNTS MV E velocity: 183.00 cm/s  Systemic VTI:  0.34 m MV A velocity: 116.00 cm/s  Systemic Diam: 2.00 cm MV E/A ratio:  1.58 Buford Dresser MD Electronically signed by Buford Dresser MD Signature Date/Time: 06/26/2021/8:40:43 PM    Final    Medications:  sodium chloride     sodium chloride      (feeding supplement) PROSource Plus  30 mL Oral BID BM   acidophilus  1 capsule Oral Daily   allopurinol  100 mg Oral QPM   amLODipine  10 mg Oral Daily   azithromycin  500 mg  Oral Daily   calcium acetate  1,334 mg Oral BID WC   Chlorhexidine Gluconate Cloth  6 each Topical Q0600   cinacalcet  30 mg Oral Q breakfast   cloNIDine  0.1 mg Oral TID   Darbepoetin Alfa  100 mcg Intravenous Q Fri-HD   doxercalciferol  4 mcg Intravenous Q M,W,F-HD   heparin  5,000 Units Subcutaneous Q8H   ipratropium-albuterol  3 mL Nebulization QID   irbesartan  300 mg Oral Daily   isosorbide mononitrate  30 mg Oral Daily   labetalol  200 mg Oral TID   montelukast  10 mg Oral Q supper   multivitamin  1 tablet Oral QHS   pantoprazole  40 mg Oral Daily   predniSONE  20 mg Oral Q breakfast   umeclidinium bromide   Inhalation Daily    Dialysis Orders: MWF  - [facility]  3.5hrs, BFR 400, DFR 600,  EDW 62kg, 2K/ 2.5Ca   Access: LU AVF  Heparin none EPO 12100 qHD Hectorol 12mcg IV qHD      Assessment/Plan:  Pulmonary edema/volume overload- noted on CXR on admit.  HD yesterday with net UF 2.3L.  Improving on CXR yesterday and clinically improved today. UF with HD as tolerated.  Fever/?PNA- +adenovirus.  CXR today with likely R sided airspace disease suspicious for PNA. per PMD  ESRD -  on HD MWF, HD today per regular schedule. Tolerating well.   Hypertension - Blood pressure close to goal. Continue home meds: amlodipine 10, clonidine 0.1 TID, olmesartan 40 qd, hydralazine 100mg  TID, imdur 30mg  qd.  Hold prior to HD.    Anemia of CKD - Hgb 8.5, on EPO TIW, aranesp ordered with HD today  Secondary Hyperparathyroidism -  CCa and phos in goal. On increased Ca bath, continue VDRA.   Nutrition - Renal diet w/fluid restrictions.  Chronic respiratory failure w/hypoxia/Asthma/OSA- on 3L at baseline 9. DMT2 - diet controlled.     Anice Paganini, PA-C 06/28/2021, 9:56 AM  [facility] Pager: [phone]

## 2021-06-28 NOTE — Progress Notes (Signed)
Initial Nutrition Assessment  DOCUMENTATION CODES:   Not applicable  INTERVENTION:   Nepro Shake po BID, each supplement provides 425 kcal and 19 grams protein  Renal MVI daily  30 ml ProSource Plus BID, each supplement provides 100 kcals and 15 grams protein.   Plan to liberalize diet on follow-up if po intake does not improve  NUTRITION DIAGNOSIS:   Increased nutrient needs related to chronic illness (ESRD on HD, COPD on  home oxygen) as evidenced by estimated needs.  GOAL:   Patient will meet greater than or equal to 90% of their needs  MONITOR:   PO intake, Labs, Weight trends, Supplement acceptance, Skin  REASON FOR ASSESSMENT:   Malnutrition Screening Tool    ASSESSMENT:   61 yo female admitted with acute on chronic respiratory failure with COPD exacerbation, volume overload. PMH includes ESRD on HD, HTN, COPD on home oxygen  Receiving iHD today  Recorded po intake 0-100% of meals. Only ate 25% of meals yesterday  EDW 62 kg; current wt 61.3 kg. Currently under dry weight which is concerning that pt has lost "true" weight. Admit weight 65.8 kg  Labs: sodium 133 (L), phosphorus 5.3 Meds: acidophilus, PhosLo with meals, sensipar, aranesp, hectoral, Rena-Vite, prednisone    Diet Order:   Diet Order             Diet renal with fluid restriction Fluid restriction: 1200 mL Fluid; Room service appropriate? Yes; Fluid consistency: Thin  Diet effective now                   EDUCATION NEEDS:   Not appropriate for education at this time  Skin:  Skin Assessment: Reviewed RN Assessment  Last BM:  5/18  Height:   Ht Readings from Last 1 Encounters:  06/26/21 5\' 2"  (1.575 m)    Weight:   Wt Readings from Last 1 Encounters:  06/28/21 61.3 kg     BMI:  Body mass index is 24.72 kg/m.  Estimated Nutritional Needs:   Kcal:  1800-2000 kcals  Protein:  90-105 g  Fluid:  1000 mL plus UOP    Kerman Passey MS, RDN, LDN, CNSC Registered  Dietitian III Clinical Nutrition RD Pager and On-Call Pager Number Located in [place]

## 2021-06-28 NOTE — Progress Notes (Signed)
Pt receives out-pt HD at [facility] in [facility] on MWF. Pt has a 6:00 chair time. Clinic advised pt tested positive for adenovirus in the event they needed knowledge of that diagnosis. Will assist as needed.   Melven Sartorius Renal Navigator [phone]

## 2021-06-28 NOTE — TOC Initial Note (Signed)
Transition of Care [facility]) - Initial/Assessment Note    Patient Details  Name: Jodi Bell MRN: [id number] Date of Birth: [birth year]  Transition [facility] CM/SW Contact:    Verdell Carmine, RN Phone Number: 06/28/2021, 11:14 AM  Clinical Narrative:                  61 year old with history of ESRD on dialysis, COPD, home oxygen at 3L presents with [facility] and fever. Dx with adenovirus.  Less SHOB today, but still febrile.. Following regular dialysis schedule while here.  Lives at home has home oxygen at 3L. Was last seen in 2022 October by [facility] for [place].  CM will follow for needs, recommendations, and transitions.   Expected Discharge Plan: Home/Self Care Barriers to Discharge: Continued Medical Work up   Patient Goals and CMS Choice        Expected Discharge Plan and Services Expected Discharge Plan: Home/Self Care       Living arrangements for the past 2 months: Single Family Home                                      Prior Living Arrangements/Services Living arrangements for the past 2 months: Single Family Home   Patient language and need for interpreter reviewed:: Yes        Need for Family Participation in Patient Care: Yes (Comment) Care giver support system in place?: Yes (comment) Current home services: DME (Oxygen at 3L) Criminal Activity/Legal Involvement Pertinent to Current Situation/Hospitalization: No - Comment as needed  Activities of Daily Living Home Assistive Devices/Equipment: Cane (specify quad or straight), Walker (specify type) ADL Screening (condition at time of admission) Patient's cognitive ability adequate to safely complete daily activities?: Yes Is the patient deaf or have difficulty hearing?: No Does the patient have difficulty seeing, even when wearing glasses/contacts?: No Does the patient have difficulty concentrating, remembering, or making decisions?: No Patient able to express need for assistance with  ADLs?: Yes Does the patient have difficulty dressing or bathing?: No Independently performs ADLs?: Yes (appropriate for developmental age) Does the patient have difficulty walking or climbing stairs?: Yes Weakness of Legs: Both Weakness of Arms/Hands: Both  Permission Sought/Granted                  Emotional Assessment       Orientation: : Oriented to Self, Oriented to Place, Oriented to  Time Alcohol / Substance Use: Not Applicable Psych Involvement: No (comment)  Admission diagnosis:  Volume overload [E87.70] Patient Active Problem List   Diagnosis Date Noted   Adenovirus infection, unspecified    Volume overload 06/26/2021   COVID-19 02/13/2021   Coronary artery disease involving coronary bypass graft of native heart with angina pectoris ([place]) 01/10/2021   Paroxysmal atrial fibrillation ([place]) 01/10/2021   Aortic stenosis 01/10/2021   Mitral stenosis 01/10/2021   Acute exacerbation of CHF (congestive heart failure) ([place]) 01/02/2021   Pressure injury of skin 09/21/2020   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/13/2020   GI bleeding 05/17/2019   Volume overload state of heart 09/14/2018   Intervertebral cervical disc disorder with myelopathy, cervical region 09/14/2018   OSA (obstructive sleep apnea) 09/14/2018   Asthma 05/16/2018   Suspected COVID-19 virus infection 05/16/2018   ESRD (end stage renal disease) ([place]) 05/16/2018   Status post left partial knee replacement 11/12/2017   GERD (gastroesophageal reflux disease) 09/20/2017  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 [place] [address] Phone: [phone] Fax: [id number] - [place], [place] - [address] [place] [address] Phone: [phone] Fax: [phone]  [id number] - [address], [place] [address] Phone: [phone] Fax: [phone]     Social Determinants of Health (SDOH) Interventions    Readmission Risk Interventions     View : No data to display.

## 2021-06-28 NOTE — Progress Notes (Signed)
Pt off the unit to hemodialysis  

## 2021-06-28 NOTE — Progress Notes (Signed)
Subjective:  Overnight:NAEON, tachycardic to 140 so home labetalol was added.   Evaluated in HD. Patient states she is feeling better than her admission. She still has the cough. States it is non-productive but she feels like something is trying to come up.   Objective: Vital signs in last 24 hours: normotensive, tachycardic to 140 but now heart rate normal. Febrile at 100.4 overnight. Satting well on 2 L. Vitals:   06/27/21 1812 06/27/21 2214 06/28/21 0346 06/28/21 0406  BP: 117/64 130/66  (!) 116/59  Pulse: 87 79  72  Resp: 20 16    Temp: (!) 100.4 F (38 C) 99.7 F (37.6 C)  98.9 F (37.2 C)  TempSrc: Oral Oral  Oral  SpO2: 99% 95% 95% 99%  Weight:  63.2 kg    Height:       Supplemental O2: Nasal Cannula SpO2: 99 % O2 Flow Rate (L/min): 2 L/min Filed Weights   06/26/21 1845 06/26/21 2230 06/27/21 2214  Weight: 65.8 kg 63.5 kg 63.2 kg    Intake/Output Summary (Last 24 hours) at 06/28/2021 0617 Last data filed at 06/27/2021 2200 Gross per 24 hour  Intake 737 ml  Output 0 ml  Net 737 ml    Net IO Since Admission: -1,508 mL [06/28/21 0617]  Physical Exam  General: NAD laying in bed comfortably HENT: NCAT, on [place] Lungs: Rhonchi present diffusely but no wheezing.  Cardiovascular: Normal sinus rate and rhythm present, systolic murmur present, no lower extremity edema MSK: no asymmetry Skin: no lesions on exposed skin Neuro: alert and oriented Psych: normal mood and normal affect  Diagnostics CBC shows resolved leukocytosis. Anemia worsened with hgb 8.5 from 9.5 and MCV 103.8.  Assessment/Plan: Jodi Bell is a 61 y.o. with COPD on 3 L supplemental oxygen at baseline , ESRD on HD MWF, and HTN who presented with dyspnea, fever, and sore throat found to have rhinovirus concerning for viral induced COPD exacerbation.   COPD Exacerbation 2/2 URI Hx of chronic respiratory failure with hypoxia, COPD, Asthma, OSA not on CPAP Stable to improving. Patient on 2 L [place] at  here and at 2-3 at home. RVP positive for adenovirus. NGTD on blood cultures. Appears viral infection resulted in copd exacerbation. Concern was present for pneumonia but s/p initiation of prednisone and azithro the leukocytosis has resolved. Will avoid starting new inhalers at discharge as patient has pulmonary follow up outpatient at the end of this month for PFTs.  - Prednisone 40 mg 2/5 day and azithromycin 500 mg 2/3 day - q4 duonebs, incruse daily, continue home montelukast  - CTM for sign or symptoms of infection  #Volume overload in setting of ESRD on HD #Moderate Aortic Stenosis and Mitral Stenosis #Hx of HTN Improving. Normotensive. Home medication regimen: amlodipine 10, clonidine .1 TID, Olmesartan 40 mg ( substitute Irbesartan on our formulary), hydralazine 100 mg q8hrs, Imdur 30 mg q 24 hours. Labetalol 200 mg TID restarted overnight due to tachycardia.  In HD this am, will evaluate after HD and plan discharge this afternoon.  - Continue amlodipine, labetalol, clonidine, Olmesartan , and Imdur.  - Continue home HD schedule.  #Hx of T2DM Hemoglobin a1c 5.2 09/13/2020. Diet controlled. CBG 81.  -CTM    #Anxiety Chronic. continue home medication, Ativan 1 mg prn q8hrs    #Chronic back pain Chronic. Continue home PRN Oxycodone 10 mg  BID   #Insomnia Chronic. Continue home Ambien qhs  Diet: Renal IVF: None VTE: Heparin Code: Full Prior to Admission  Living Arrangement: Home Anticipated Discharge Location: Home Barriers to Discharge: Medical Workup Dispo: Anticipated discharge today  Idamae Schuller, MD Tillie Rung. [facility] Internal Medicine Residency, PGY-1  Pager: [phone] After 5 pm and on weekends: Please call the on-call pager

## 2021-06-29 LAB — GLUCOSE, CAPILLARY
Glucose-Capillary: 92 mg/dL (ref 70–99)
Glucose-Capillary: 149 mg/dL — ABNORMAL HIGH (ref 70–99)

## 2021-06-29 MED ORDER — GUAIFENESIN ER 600 MG PO TB12: 1200.0000 mg | ORAL_TABLET | Freq: Two times a day (BID) | ORAL | 0 refills | Status: AC

## 2021-06-29 MED ORDER — IPRATROPIUM-ALBUTEROL 0.5-2.5 (3) MG/3ML IN SOLN: 3.0000 mL | Freq: Two times a day (BID) | RESPIRATORY_TRACT | Status: DC

## 2021-06-29 MED ORDER — PREDNISONE 5 MG PO TABS
ORAL_TABLET | ORAL | 2 refills | Status: AC
Start: 2021-06-29 — End: 2021-12-28

## 2021-06-29 NOTE — Plan of Care (Signed)
  Problem: Health Behavior/Discharge Planning: Goal: Ability to manage health-related needs will improve Outcome: Completed/Met   Problem: Clinical Measurements: Goal: Respiratory complications will improve Outcome: Completed/Met   Problem: Nutrition: Goal: Adequate nutrition will be maintained Outcome: Completed/Met

## 2021-06-29 NOTE — Discharge Summary (Signed)
Name: Jodi Bell MRN: [id number] DOB: [birth year] 61 y.o. PCP: [name]., MD  Date of Admission: 06/26/2021  6:24 AM Date of Discharge: 06/29/2021 Attending Physician: Dr.  Cain Sieve  Discharge Diagnosis: Principal Problem:   Volume overload Active Problems:   Essential hypertension   COPD exacerbation (HCC)   Adenovirus infection, unspecified    Discharge Medications: Allergies as of 06/29/2021       Reactions   3-methyl-2-benzothiazolinone Hydrazone Other (See Comments)   Muscle weakness muscle cramping in legs Other reaction(s): Myalgias (Muscle Pain)   Banana Anaphylaxis, Swelling, Other (See Comments)   TONGUE AND MOUTH SWELLING   Black Walnut Flavor Anaphylaxis, Itching   Walnuts   Hazelnut (filbert) Allergy Skin Test Anaphylaxis   Hazelnuts   Leflunomide And Related Other (See Comments)   Severe Headache   Lisinopril Swelling   Angioedema  Swelling of the face   No [employer] Food Allergies Anaphylaxis   Spicy Mustard 4.7.2021 Pt reports that she eats Regular Yellow Mustard Swelling and itching of tongue   Other Other (See Comments), Anaphylaxis, Swelling   Cosentyx= angioedema Other reaction(s): Facial Edema (intolerance) Mouth itches and tongue swelling Hazel nuts and pecans also Walnuts Walnuts Renal failure Other reaction(s): Other Serotonin Syndrome  Serotonin syndrome  Tremors Other reaction(s): Other Tremors Muscle weakness muscle cramping in legs Other reaction(s): Myalgias (Muscle Pain) Mouth itches and tongue swelling Hazel nuts and pecans also Walnuts   Pecan Extract Allergy Skin Test Anaphylaxis, Itching   Pecans   Pecan Nut (diagnostic) Anaphylaxis, Itching   Pecans   Trazodone And Nefazodone Other (See Comments)   Serotonin Syndrome  Tremors   Adalimumab Other (See Comments)   Blisters on abdomen =humira   Escitalopram Oxalate Other (See Comments)   Hand problems - Serotonin Syndrome    Ezetimibe Other (See Comments)    myalgias cramps   Secukinumab Swelling   Cosentyx = angiodema   Statins Other (See Comments)   Leg pains and weakness   Ferrlecit [na Ferric Gluc Cplx In Sucrose]    Not reaction given from HD   Gabapentin Other (See Comments)   tremors   Ibuprofen Other (See Comments)   Renal Problems        Medication List     STOP taking these medications    doxycycline 100 MG capsule Commonly known as: VIBRAMYCIN   EPINEPHrine 0.3 mg/0.3 mL Soaj injection Commonly known as: EpiPen   pentafluoroprop-tetrafluoroeth Aero Commonly known as: GEBAUERS       TAKE these medications    acetaminophen 500 MG tablet Commonly known as: TYLENOL Take 1,000 mg by mouth daily as needed (pain).   ADVIL DUAL ACTION PO Take 3 tablets by mouth 2 (two) times daily as needed (pain).   allopurinol 100 MG tablet Commonly known as: ZYLOPRIM Take 100 mg by mouth every evening.   amLODipine 10 MG tablet Commonly known as: NORVASC Take 10 mg by mouth daily.   calcium acetate 667 MG capsule Commonly known as: PHOSLO Take 1,334 mg by mouth See admin instructions. Take 1,334 by mouth twice a day when able to remember.   carboxymethylcellulose 1 % ophthalmic solution Place 2 drops into both eyes 2 (two) times daily as needed (dry eyes).   cetirizine 10 MG tablet Commonly known as: ZYRTEC Take 10 mg by mouth daily as needed for allergies.   cinacalcet 30 MG tablet Commonly known as: SENSIPAR Take 30 mg by mouth daily.   cloNIDine 0.1 MG tablet Commonly known as:  CATAPRES Take 0.1 mg by mouth 3 (three) times daily.   cyclobenzaprine 10 MG tablet Commonly known as: FLEXERIL Take 10 mg by mouth 2 (two) times daily as needed for muscle spasms.   Darbepoetin Alfa 200 MCG/0.4ML Sosy injection Commonly known as: ARANESP Inject 0.4 mLs (200 mcg total) into the vein every Wednesday with hemodialysis.   docusate sodium 100 MG capsule Commonly known as: COLACE Take 100-200 mg by mouth daily as  needed for mild constipation.   ferrous sulfate 325 (65 FE) MG tablet Take 325 mg by mouth See admin instructions. Take 325 mg by mouth three times a week   folic acid 759 MCG tablet Commonly known as: FOLVITE Take 400 mcg by mouth daily. What changed: Another medication with the same name was removed. Continue taking this medication, and follow the directions you see here.   gabapentin 300 MG capsule Commonly known as: Neurontin Take 1 capsule (300 mg total) by mouth at bedtime as needed (Neuropathic pain). What changed: when to take this   guaiFENesin 600 MG 12 hr tablet Commonly known as: MUCINEX Take 2 tablets (1,200 mg total) by mouth 2 (two) times daily for 7 days.   heparin 1000 unit/mL Soln injection 3 mLs (3,000 Units total) by Dialysis route as needed (in dialysis as needed).   heparin 1000 unit/mL Soln injection 2 mLs (2,000 Units total) by Dialysis route as needed (in dialysis as needed).   heparin 1000 unit/mL Soln injection 1 mL (1,000 Units total) by Dialysis route as needed (in dialysis).   hydrALAZINE 100 MG tablet Commonly known as: APRESOLINE Take 1 tablet (100 mg total) by mouth every 8 (eight) hours. What changed: when to take this   hydrocortisone 25 MG suppository Commonly known as: ANUSOL-HC Place 25 mg rectally daily as needed for hemorrhoids.   isosorbide mononitrate 30 MG 24 hr tablet Commonly known as: IMDUR Take 1 tablet (30 mg total) by mouth daily.   labetalol 200 MG tablet Commonly known as: NORMODYNE TAKE 1 TABLET BY MOUTH THREE TIMES DAILY   lidocaine-prilocaine cream Commonly known as: EMLA Apply 1 application. topically 2 (two) times daily as needed (pain in fingers).   LORazepam 1 MG tablet Commonly known as: ATIVAN Take 1 mg by mouth every 8 (eight) hours as needed for anxiety.   montelukast 10 MG tablet Commonly known as: SINGULAIR Take 10 mg by mouth daily with supper.   multivitamin Tabs tablet Take 1 tablet by mouth  daily with supper.   RENAL-VITE PO Take 1 tablet by mouth daily.   olmesartan 40 MG tablet Commonly known as: BENICAR Take 40 mg by mouth every evening.   ondansetron 4 MG tablet Commonly known as: ZOFRAN Take 4 mg by mouth 2 (two) times daily as needed for nausea or vomiting.   OVER THE COUNTER MEDICATION Take by mouth daily as needed (sickness). Elderberry Fruit and flower 1 dropper full by mouth daily as needed for sickness   Oxycodone HCl 10 MG Tabs Take 10 mg by mouth daily as needed for pain.   OXYGEN Inhale 2 L into the lungs as directed.   pantoprazole 40 MG tablet Commonly known as: PROTONIX Take 40 mg by mouth daily.   polyethylene glycol powder 17 GM/SCOOP powder Commonly known as: GLYCOLAX/MIRALAX Mix 17 grams into 8 ounces of liquid and drink by mouth daily. What changed:  when to take this reasons to take this   predniSONE 5 MG tablet Commonly known as: DELTASONE Take 4 tablets (20  mg total) by mouth daily with breakfast for 2 days, THEN 1 tablet (5 mg total) daily with breakfast. Start taking on: Jun 29, 2021 What changed: See the new instructions.   PROBIOTIC PO Take 1 capsule by mouth daily.   sorbitol 70 % solution Take 30 mLs by mouth See admin instructions. Take 30 ml one- two times daily as needed for constipation   SPIRIVA RESPIMAT IN Inhale 2 puffs into the lungs 2 (two) times daily as needed (wheezing/SOB).   TUMS PO Take 2-3 tablets by mouth daily as needed (acid reflux).   albuterol (2.5 MG/3ML) 0.083% nebulizer solution Commonly known as: PROVENTIL Take 2.5 mg by nebulization every 6 (six) hours as needed for wheezing or shortness of breath.   Ventolin HFA 108 (90 Base) MCG/ACT inhaler Generic drug: albuterol Inhale 2 puffs into the lungs See admin instructions. Take 2 puffs every 4-6 hours as needed for wheezing and shortness of breath   cholecalciferol 10 MCG (400 UNIT) Tabs tablet Commonly known as: VITAMIN D3 Take 800 Units  by mouth daily.   Vitamin D3 25 MCG tablet Commonly known as: Vitamin D Take 1 tablet (1,000 Units total) by mouth every evening.   VOLTAREN EX Apply 1 application. topically daily as needed (pain).   zolpidem 5 MG tablet Commonly known as: AMBIEN Take 5 mg by mouth at bedtime as needed for sleep.        Disposition and follow-up:   Ms.Donda T Cashin was discharged from [facility] in Stable condition.  At the hospital follow up visit please address:  1.  Follow-up:  a. COPD exacerbation: Tested positive for the adenovirus. She was back to her baseline 2-3 L on day of discharge after treatment with duonebs and prednisone. Patient scheduled for PFT on 5/31, so ensure she goes to this appointment to further assess her lung function.     b. ESRD: Found to be volume overload on admission. Received HD on Wednesday and Friday during her short hospitalization. Please ensure patient is going to her outpatient HD sessions.   2.  Labs / imaging needed at time of follow-up: None  3.  Pending labs/ test needing follow-up: None  Follow-up Appointments:  Follow-up Information     [name]., MD Follow up.   Specialty: Internal Medicine Contact information: [address] [place] [address] [phone]                 Hospital Course by problem list: COPD Exacerbation 2/2 URI Hx of chronic respiratory failure with hypoxia, COPD, Asthma, OSA not on CPAP Patient had worsening cough and increased dyspnea. Patient initially placed on 4 L  here and at 3 L at home. RVP positive for adenovirus. NGTD on blood cultures was negative throughout her hospitalization. Appears viral infection resulted in copd exacerbation. Recent CXR shows some concern for worsening atelectasis vs pneumonia. Prednisone 40 mg x5 days and azithromycin 500 mg x3 days was started along with duonebs, incruse daily, continue home montelukast, patient's respiratory status  improved. Patient had mild leukocytosis that resolved after initiation of the antibiotics. Discharged home to continue prednisone 20 mg daily for 2 additional days.    Volume overload in setting of ESRD on HD Moderate Aortic Stenosis and Mitral Stenosis Hx of HTN SBP to 180's on admission. Home amlodipine given in ED before admission. Home medication regimen: amlodipine 10, clonidine .1 TID, Olmesartan 40 mg ( substitute Irbesartan on our formulary), hydralazine 100 mg  q8hrs, Imdur 30 mg q 24 hours and labetalol 200 mg TID was started. HD x2 was done on schedule while patient was in the hospital. Weight on admission was 65.8 kg and on discharge it was 61.3 kg.   Hx of T2DM Hemoglobin a1c 5.2 09/13/2020. Diet controlled. Had CBG of 67, so SSI was discontinued.   Anxiety Chronic. home medication, Ativan 1 mg prn q8hrs was continued.    Chronic back pain Chronic. home PRN Oxycodone 10 mg BID was continued.    Insomnia Chronic. Home Ambien qhs was continued.     Subjective: Patient evaluated at the bedside laying comfortably in bed. She continues to have a cough but significantly improved. States the Mucinex is helping. She does any chest pain, palpitations, fever, chills, N/V or abdominal pain. Advised to follow up with PCP next week after discharge.   Discharge Vitals:   BP (!) 144/61 (BP Location: Right Arm)   Pulse 73   Temp 99.6 F (37.6 C) (Oral)   Resp 18   Ht 5\' 2"  (1.575 m)   Wt 61.3 kg   SpO2 100%   BMI 24.72 kg/m  Discharge Exam: General: Pleasant, elderly woman laying in bed. No acute distress. CV: RRR. III/VI harsh systolic murmur. No LE edema Pulmonary: Lungs CTAB. Normal effort. Distant rales at base of left lung. No wheezing.  Abdominal: Soft, nontender, nondistended. Normal bowel sounds. Extremities: Palpable radial and DP pulses. Normal ROM. Skin: Warm and dry. No obvious rash or lesions. Neuro: A&Ox3. Moves all extremities. Normal sensation. No focal  deficit. Psych: Normal mood and affect  Pertinent Labs, Studies, and Procedures:     Latest Ref Rng & Units 06/28/2021    3:46 AM 06/27/2021    2:50 AM 06/26/2021   11:18 PM  CBC  WBC 4.0 - 10.5 K/uL 7.8   10.7   10.6    Hemoglobin 12.0 - 15.0 g/dL 8.5   9.5   9.9    Hematocrit 36.0 - 46.0 % 27.5   30.9   31.7    Platelets 150 - 400 K/uL 154   168   158         Latest Ref Rng & Units 06/28/2021    9:05 AM 06/26/2021   11:18 PM 06/26/2021    6:50 AM  CMP  Glucose 70 - 99 mg/dL 108   62   88    BUN 8 - 23 mg/dL 43   17   39    Creatinine 0.44 - 1.00 mg/dL 7.59   4.43   7.94    Sodium 135 - 145 mmol/L 133   132   135    Potassium 3.5 - 5.1 mmol/L 4.9   4.0   4.5    Chloride 98 - 111 mmol/L 95   98   98    CO2 22 - 32 mmol/L 25   25   23     Calcium 8.9 - 10.3 mg/dL 8.2   8.5   7.6    Total Protein 6.5 - 8.1 g/dL   6.4    Total Bilirubin 0.3 - 1.2 mg/dL   0.5    Alkaline Phos 38 - 126 U/L   206    AST 15 - 41 U/L   18    ALT 0 - 44 U/L   12      DG CHEST PORT 1 VIEW  Result Date: 06/27/2021 CLINICAL DATA:  Shortness of breath EXAM: PORTABLE CHEST 1 VIEW COMPARISON:  06/26/2021 FINDINGS: Bilateral diffuse interstitial thickening with right infrahilar airspace disease. No pleural effusion or pneumothorax. Stable cardiomegaly. Prior median sternotomy. Prior CABG. No acute osseous abnormality. IMPRESSION: 1. Findings concerning for mild CHF. Right infrahilar airspace disease which may reflect atelectasis versus pneumonia. Electronically Signed   By: Kathreen Devoid M.D.   On: 06/27/2021 10:31   DG Chest Port 1 View  Result Date: 06/26/2021 CLINICAL DATA:  SOB, dialysis patient, elevated BP EXAM: PORTABLE CHEST - 1 VIEW COMPARISON:  02/13/2021 FINDINGS: Some worsening of diffuse interstitial opacities and marked central pulmonary vascular congestion. Coarse atelectasis or scarring at the left lung base. Stable cardiomegaly. CABG marker and sternotomy wires. Aortic Atherosclerosis  (ICD10-170.0). No effusion. Cervical fixation hardware partially visualized. IMPRESSION: Worsening pulmonary vascular congestion/edema. Stable cardiomegaly Electronically Signed   By: Lucrezia Europe M.D.   On: 06/26/2021 07:00   ECHOCARDIOGRAM COMPLETE  Result Date: 06/26/2021    ECHOCARDIOGRAM REPORT   Patient Name:   Jodi Bell Date of Exam: 06/26/2021 Medical Rec #:  [id number]        Height:       62.0 in Accession #:    [id number]       Weight:       145.1 lb Date of Birth:  26-Apr-1960         BSA:          1.668 m Patient Age:    36 years         BP:           135/59 mmHg Patient Gender: F                HR:           87 bpm. Exam Location:  Inpatient Procedure: 2D Echo, Cardiac Doppler and Color Doppler Indications:    Dyspnea R06.00  History:        Patient has prior history of Echocardiogram examinations, most                 recent 01/02/2021. COPD, Signs/Symptoms:Dyspnea; Risk                 Factors:Diabetes, Hypertension, GERD, Sleep Apnea and Current                 Smoker.  Sonographer:    Bernadene Person RDCS Referring Phys: [id number] Omega  1. Left ventricular ejection fraction, by estimation, is 70 to 75%. The left ventricle has hyperdynamic function. The left ventricle has no regional wall motion abnormalities. There is severe left ventricular hypertrophy. Left ventricular diastolic parameters are consistent with Grade II diastolic dysfunction (pseudonormalization). Elevated left ventricular end-diastolic pressure.  2. Right ventricular systolic function is normal. The right ventricular size is normal. There is normal pulmonary artery systolic pressure.  3. Left atrial size was severely dilated.  4. Right atrial size was mildly dilated.  5. The mitral valve is degenerative. Mild mitral valve regurgitation. No evidence of mitral stenosis. Severe mitral annular calcification.  6. Tricuspid valve regurgitation is mild to moderate.  7. Visually, by planimetry, and by DI aortic valve  has moderate stenosis. However, peak velocity >4, suggesting severe stenosis. This does not appear to be explained by LVOT gradient/obstruction. The aortic valve is tricuspid. There is moderate calcification of the aortic valve. Aortic valve regurgitation is not visualized. Moderate to severe aortic valve stenosis.  8. The inferior vena cava is dilated in size with <50% respiratory variability, suggesting right atrial pressure of  15 mmHg. Comparison(s): Changes from prior study are noted. Moderate to severe aortic stenosis; peak velocity >4, but by visual/planimetry/DI suggests moderate. FINDINGS  Left Ventricle: Left ventricular ejection fraction, by estimation, is 70 to 75%. The left ventricle has hyperdynamic function. The left ventricle has no regional wall motion abnormalities. The left ventricular internal cavity size was normal in size. There is severe left ventricular hypertrophy. Left ventricular diastolic parameters are consistent with Grade II diastolic dysfunction (pseudonormalization). Elevated left ventricular end-diastolic pressure. Right Ventricle: The right ventricular size is normal. Right vetricular wall thickness was not well visualized. Right ventricular systolic function is normal. There is normal pulmonary artery systolic pressure. The tricuspid regurgitant velocity is 2.58 m/s, and with an assumed right atrial pressure of 8 mmHg, the estimated right ventricular systolic pressure is 16.6 mmHg. Left Atrium: Left atrial size was severely dilated. Right Atrium: Right atrial size was mildly dilated. Pericardium: Trivial pericardial effusion is present. Mitral Valve: The mitral valve is degenerative in appearance. Severe mitral annular calcification. Mild mitral valve regurgitation. No evidence of mitral valve stenosis. Tricuspid Valve: The tricuspid valve is normal in structure. Tricuspid valve regurgitation is mild to moderate. No evidence of tricuspid stenosis. Aortic Valve: Visually, by  planimetry, and by DI aortic valve has moderate stenosis. However, peak velocity >4, suggesting severe stenosis. This does not appear to be explained by LVOT gradient/obstruction. The aortic valve is tricuspid. There is moderate calcification of the aortic valve. Aortic valve regurgitation is not visualized. Moderate to severe aortic stenosis is present. Aortic valve mean gradient measures 41.3 mmHg. Aortic valve peak gradient measures 73.2 mmHg. Aortic valve area, by VTI measures 1.27 cm. Pulmonic Valve: The pulmonic valve was not well visualized. Pulmonic valve regurgitation is not visualized. No evidence of pulmonic stenosis. Aorta: The aortic root, ascending aorta, aortic arch and descending aorta are all structurally normal, with no evidence of dilitation or obstruction. Venous: The inferior vena cava is dilated in size with less than 50% respiratory variability, suggesting right atrial pressure of 15 mmHg. IAS/Shunts: The atrial septum is grossly normal. Additional Comments: There is a small pleural effusion in both left and right lateral regions.  LEFT VENTRICLE PLAX 2D LVIDd:         3.66 cm      Diastology LVIDs:         2.08 cm      LV e' medial:    4.60 cm/s LV PW:         1.77 cm      LV E/e' medial:  39.8 LV IVS:        2.11 cm      LV e' lateral:   7.42 cm/s LVOT diam:     2.00 cm      LV E/e' lateral: 24.7 LV SV:         108 LV SV Index:   65 LVOT Area:     3.14 cm  LV Volumes (MOD) LV vol d, MOD A2C: 101.0 ml LV vol d, MOD A4C: 133.0 ml LV vol s, MOD A2C: 28.2 ml LV vol s, MOD A4C: 47.9 ml LV SV MOD A2C:     72.8 ml LV SV MOD A4C:     133.0 ml LV SV MOD BP:      83.7 ml RIGHT VENTRICLE RV S prime:     11.30 cm/s TAPSE (M-mode): 1.8 cm LEFT ATRIUM             Index  RIGHT ATRIUM           Index LA diam:        4.60 cm 2.76 cm/m   RA Area:     18.20 cm LA Vol (A2C):   92.3 ml 55.34 ml/m  RA Volume:   44.70 ml  26.80 ml/m LA Vol (A4C):   86.8 ml 52.04 ml/m LA Biplane Vol: 91.0 ml 54.56  ml/m  AORTIC VALVE AV Area (Vmax):    1.28 cm AV Area (Vmean):   1.25 cm AV Area (VTI):     1.27 cm AV Vmax:           427.67 cm/s AV Vmean:          301.333 cm/s AV VTI:            0.852 m AV Peak Grad:      73.2 mmHg AV Mean Grad:      41.3 mmHg LVOT Vmax:         174.00 cm/s LVOT Vmean:        120.000 cm/s LVOT VTI:          0.343 m LVOT/AV VTI ratio: 0.40  AORTA Ao Root diam: 2.90 cm Ao Asc diam:  3.70 cm MITRAL VALVE                TRICUSPID VALVE MV Area (PHT): 2.73 cm     TR Peak grad:   26.6 mmHg MV Decel Time: 278 msec     TR Vmax:        258.00 cm/s MR PISA:        1.57 cm MR PISA Radius: 0.50 cm     SHUNTS MV E velocity: 183.00 cm/s  Systemic VTI:  0.34 m MV A velocity: 116.00 cm/s  Systemic Diam: 2.00 cm MV E/A ratio:  1.58 Buford Dresser MD Electronically signed by Buford Dresser MD Signature Date/Time: 06/26/2021/8:40:43 PM    Final      Discharge Instructions: Ms. Stlouis, It was a pleasure taking care of you at [facility] were admitted for shortness of breath, sore throat, fever and treated for COPD exacerbation due to adenovirus infection (common cold virus). We are discharging you home now that you are doing better. Please follow the following instructions.  1) Continue taking prednisone 20 mg for 2 days, then go back to your usual 5 mg daily 2) Continue taking Mucinex as needed for cough and congestion 3) Follow up with your PCP next week for a hospital follow up  Take care,  Dr. Linwood Dibbles, MD, MPH  Signed: Lacinda Axon, MD 06/29/2021, 7:50 AM   Pager: [phone]

## 2021-06-29 NOTE — Progress Notes (Signed)
DISCHARGE NOTE HOME Jodi Bell to be discharged Home per MD order. Discussed prescriptions and follow up appointments with the patient. Prescriptions given to patient; medication list explained in detail. Patient verbalized understanding.  Skin clean, dry and intact without evidence of skin break down, no evidence of skin tears noted. IV catheter discontinued intact. Site without signs and symptoms of complications. Dressing and pressure applied. Pt denies pain at the site currently. No complaints noted.  Patient free of lines, drains, and wounds.   An After Visit Summary (AVS) was printed and given to the patient. Patient escorted via wheelchair, and discharged home via private auto.  Vira Agar, RN

## 2021-06-29 NOTE — Discharge Instructions (Signed)
Ms. Dobler, It was a pleasure taking care of you at [facility] were admitted for shortness of breath, sore throat, fever and treated for COPD exacerbation due to adenovirus infection (common cold virus). We are discharging you home now that you are doing better. Please follow the following instructions.  1) Continue taking prednisone 20 mg for 2 days, then go back to your usual 5 mg daily 2) Continue taking Mucinex as needed for cough and congestion 3) Follow up with your PCP next week for a hospital follow up  Take care,  Dr. Linwood Dibbles, MD, MPH

## 2021-06-29 NOTE — TOC Transition Note (Signed)
Transition of Care [facility]) - CM/SW Discharge Note   Patient Details  Name: Jodi Bell MRN: [id number] Date of Birth: 04-15-1960  [facility] CM/SW Contact:  Bartholomew Crews, RN Phone Number: [phone] 06/29/2021, 1:47 PM   Clinical Narrative:     Spoke with patient on her hospital room phone to discuss post acute transition. Patient stated that her daughter will be picking her up. Patient normally drives herself to dialysis. She reports having PCP and cardiologist. She verbalizes no difficulting picking up, affording, or taking her medications as prescribed. No TOC needs identified at this time.   Final next level of care: Home/Self Care Barriers to Discharge: No Barriers Identified   Patient Goals and CMS Choice Patient states their goals for this hospitalization and ongoing recovery are:: return home CMS Medicare.gov Compare Post Acute Care list provided to:: Patient Choice offered to / list presented to : Patient  Discharge Placement                       Discharge Plan and Services                DME Arranged: N/A DME Agency: NA       HH Arranged: NA HH Agency: NA        Social Determinants of Health (SDOH) Interventions     Readmission Risk Interventions    06/28/2021    5:10 PM  Readmission Risk Prevention Plan  Transportation Screening Complete  Medication Review (RN Care Manager) Complete  HRI or [place] Complete  [place] Not Applicable

## 2021-06-29 NOTE — Hospital Course (Addendum)
COPD Exacerbation 2/2 URI Hx of chronic respiratory failure with hypoxia, COPD, Asthma, OSA not on CPAP Patient had worsening cough and increased dyspnea. Patient initially placed on 4 L [place] here and at 3 L at home. RVP positive for adenovirus. NGTD on blood cultures was negative throughout her hospitalization. Appears viral infection resulted in copd exacerbation. Recent CXR shows some concern for worsening atelectasis vs pneumonia. Prednisone 40 mg x5 days and azithromycin 500 mg x3 days was started along with duonebs, incruse daily, continue home montelukast, patient's respiratory status improved. Patient had mild leukocytosis that resolved after initiation of the antibiotics. Discharged home to continue prednisone 20 mg daily for 2 additional days.    Volume overload in setting of ESRD on HD Moderate Aortic Stenosis and Mitral Stenosis Hx of HTN SBP to 180's on admission. Home amlodipine given in ED before admission. Home medication regimen: amlodipine 10, clonidine .1 TID, Olmesartan 40 mg ( substitute Irbesartan on our formulary), hydralazine 100 mg q8hrs, Imdur 30 mg q 24 hours and labetalol 200 mg TID was started. HD x2 was done on schedule while patient was in the hospital. Weight on admission was 65.8 kg and on discharge it was 61.3 kg.   Hx of T2DM Hemoglobin a1c 5.2 09/13/2020. Diet controlled. Had CBG of 67, so SSI was discontinued.   Anxiety Chronic. home medication, Ativan 1 mg prn q8hrs was continued.    Chronic back pain Chronic. home PRN Oxycodone 10 mg BID was continued.    Insomnia Chronic. Home Ambien qhs was continued.

## 2021-06-29 NOTE — Progress Notes (Signed)
[facility] Progress Note   Subjective:   Seen in room, getting breathing treatment. Still has cough but overall breathing is improved. Denies SOB, CP, dizziness, abdominal pain and nausea.   Objective Vitals:   06/28/21 1741 06/28/21 2206 06/28/21 2212 06/29/21 0459  BP: (!) 109/50 (!) 129/59  (!) 144/61  Pulse: 75 75  73  Resp: 18 18  18   Temp: 98.9 F (37.2 C) 98.7 F (37.1 C)  99.6 F (37.6 C)  TempSrc: Oral   Oral  SpO2: 97% 100% 98% 100%  Weight:      Height:       Physical Exam General: WDWN female, alert and in NAD Heart: RRR, 3/6 systolic murmur LSB Lungs: CTA bilaterally without wheezing, rhonchi or rales Abdomen: Soft, non-distended, +BS Extremities: No edema b/l lower extremities Dialysis Access: LUE AVF +bruit  Additional Objective Labs: Basic Metabolic Panel: Recent Labs  Lab 06/26/21 0650 06/26/21 2318 06/28/21 0905  NA 135 132* 133*  K 4.5 4.0 4.9  CL 98 98 95*  CO2 23 25 25   GLUCOSE 88 62* 108*  BUN 39* 17 43*  CREATININE 7.94* 4.43* 7.59*  CALCIUM 7.6* 8.5* 8.2*  PHOS  --  3.7 5.3*   Liver Function Tests: Recent Labs  Lab 06/26/21 0650 06/26/21 2318 06/28/21 0905  AST 18  --   --   ALT 12  --   --   ALKPHOS 206*  --   --   BILITOT 0.5  --   --   PROT 6.4*  --   --   ALBUMIN 3.1* 2.7* 2.4*   No results for input(s): LIPASE, AMYLASE in the last 168 hours. CBC: Recent Labs  Lab 06/26/21 0650 06/26/21 2318 06/27/21 0250 06/28/21 0346  WBC 5.8 10.6* 10.7* 7.8  NEUTROABS 4.6  --  9.2* 7.1  HGB 9.1* 9.9* 9.5* 8.5*  HCT 30.2* 31.7* 30.9* 27.5*  MCV 105.6* 102.6* 103.7* 103.8*  PLT 182 158 168 154   Blood Culture    Component Value Date/Time   SDES BLOOD SITE NOT SPECIFIED 06/26/2021 0650   SPECREQUEST  06/26/2021 0650    BOTTLES DRAWN AEROBIC AND ANAEROBIC Blood Culture results may not be optimal due to an excessive volume of blood received in culture bottles   CULT  06/26/2021 0650    NO GROWTH 2  DAYS Performed at [facility] [address], [place], [place] [address]    REPTSTATUS PENDING 06/26/2021 3825    Cardiac Enzymes: No results for input(s): CKTOTAL, CKMB, CKMBINDEX, TROPONINI in the last 168 hours. CBG: Recent Labs  Lab 06/28/21 0729 06/28/21 1301 06/28/21 1707 06/28/21 2207 06/29/21 0734  GLUCAP 81 81 112* 256* 92   Iron Studies: No results for input(s): IRON, TIBC, TRANSFERRIN, FERRITIN in the last 72 hours. @lablastinr3 @ Studies/Results: DG CHEST PORT 1 VIEW  Result Date: 06/27/2021 CLINICAL DATA:  Shortness of breath EXAM: PORTABLE CHEST 1 VIEW COMPARISON:  06/26/2021 FINDINGS: Bilateral diffuse interstitial thickening with right infrahilar airspace disease. No pleural effusion or pneumothorax. Stable cardiomegaly. Prior median sternotomy. Prior CABG. No acute osseous abnormality. IMPRESSION: 1. Findings concerning for mild CHF. Right infrahilar airspace disease which may reflect atelectasis versus pneumonia. Electronically Signed   By: Kathreen Devoid M.D.   On: 06/27/2021 10:31   Medications:   (feeding supplement) PROSource Plus  30 mL Oral BID BM   acidophilus  1 capsule Oral Daily   allopurinol  100 mg Oral QPM   amLODipine  10 mg  Oral Daily   azithromycin  500 mg Oral Daily   calcium acetate  1,334 mg Oral BID WC   Chlorhexidine Gluconate Cloth  6 each Topical Q0600   cinacalcet  30 mg Oral Q breakfast   cloNIDine  0.1 mg Oral TID   Darbepoetin Alfa  100 mcg Intravenous Q Fri-HD   doxercalciferol  4 mcg Intravenous Q M,W,F-HD   feeding supplement (NEPRO CARB STEADY)  237 mL Oral BID BM   guaiFENesin  1,200 mg Oral BID   heparin  5,000 Units Subcutaneous Q8H   ipratropium-albuterol  3 mL Nebulization TID   irbesartan  300 mg Oral Daily   isosorbide mononitrate  30 mg Oral Daily   labetalol  200 mg Oral TID   montelukast  10 mg Oral Q supper   multivitamin  1 tablet Oral QHS   pantoprazole  40 mg Oral Daily   predniSONE  20 mg Oral Q  breakfast   umeclidinium bromide   Inhalation Daily    Dialysis Orders: MWF  - [facility]  3.5hrs, BFR 400, DFR 600,  EDW 62kg, 2K/ 2.5Ca   Access: LU AVF  Heparin none EPO 12100 qHD Hectorol 61mcg IV qHD    Assessment/Plan:  Pulmonary edema/volume overload- noted on CXR on admit.  Improving on CXR yesterday and clinically improved. No below her outpatient EDW. Continue UF with HD as tolerated.  Fever/?PNA- +adenovirus.  CXR with likely R sided airspace disease suspicious for PNA. per PMD  ESRD -  on HD MWF. Tolerating well.   Hypertension - Blood pressure close to goal. Continue home meds: amlodipine 10, clonidine 0.1 TID, olmesartan 40 qd, hydralazine 100mg  TID, imdur 30mg  qd.  Hold prior to HD.    Anemia of CKD - Hgb 8.5, on EPO TIW, aranesp started q Friday  Secondary Hyperparathyroidism -  CCa and phos in goal. On increased Ca bath, continue VDRA.   Nutrition - Renal diet w/fluid restrictions.  Chronic respiratory failure w/hypoxia/Asthma/OSA- on 3L at baseline 9. DMT2 - diet controlled.     Anice Paganini, PA-C 06/29/2021, 8:11 AM  [facility] Pager: [phone]

## 2021-07-01 LAB — CULTURE, BLOOD (ROUTINE X 2): Culture: NO GROWTH

## 2021-07-03 LAB — CULTURE, BLOOD (ROUTINE X 2): Culture: NO GROWTH

## 2021-07-08 ENCOUNTER — Other Ambulatory Visit: Payer: Self-pay | Admitting: Surgical

## 2021-07-09 ENCOUNTER — Ambulatory Visit: Payer: Medicare PPO | Admitting: Pulmonary Disease

## 2021-07-10 ENCOUNTER — Ambulatory Visit (INDEPENDENT_AMBULATORY_CARE_PROVIDER_SITE_OTHER): Payer: Medicare PPO | Admitting: Pulmonary Disease

## 2021-07-10 ENCOUNTER — Encounter: Payer: Self-pay | Admitting: Pulmonary Disease

## 2021-07-10 VITALS — BP 136/68 | HR 77 | Temp 98.1°F | Ht 61.0 in | Wt 140.4 lb

## 2021-07-10 DIAGNOSIS — J9611 Chronic respiratory failure with hypoxia: Secondary | ICD-10-CM

## 2021-07-10 DIAGNOSIS — J984 Other disorders of lung: Secondary | ICD-10-CM | POA: Diagnosis not present

## 2021-07-10 DIAGNOSIS — R0609 Other forms of dyspnea: Secondary | ICD-10-CM | POA: Diagnosis not present

## 2021-07-10 LAB — PULMONARY FUNCTION TEST
DL/VA % pred: 89 %
DL/VA: 3.85 ml/min/mmHg/L
DLCO cor % pred: 59 %
DLCO cor: 10.74 ml/min/mmHg
DLCO unc % pred: 47 %
DLCO unc: 8.68 ml/min/mmHg
FEF 25-75 Post: 1.57 L/sec
FEF 25-75 Pre: 0.96 L/sec
FEF2575-%Change-Post: 63 %
FEF2575-%Pred-Post: 85 %
FEF2575-%Pred-Pre: 52 %
FEV1-%Change-Post: 12 %
FEV1-%Pred-Post: 57 %
FEV1-%Pred-Pre: 50 %
FEV1-Post: 1.03 L
FEV1-Pre: 0.91 L
FEV1FVC-%Change-Post: 0 %
FEV1FVC-%Pred-Pre: 104 %
FEV6-%Change-Post: 13 %
FEV6-%Pred-Post: 56 %
FEV6-%Pred-Pre: 49 %
FEV6-Post: 1.25 L
FEV6-Pre: 1.1 L
FEV6FVC-%Pred-Post: 103 %
FEV6FVC-%Pred-Pre: 103 %
FVC-%Change-Post: 13 %
FVC-%Pred-Post: 54 %
FVC-%Pred-Pre: 47 %
FVC-Post: 1.25 L
FVC-Pre: 1.1 L
Post FEV1/FVC ratio: 83 %
Post FEV6/FVC ratio: 100 %
Pre FEV1/FVC ratio: 83 %
Pre FEV6/FVC Ratio: 100 %
RV % pred: 77 %
RV: 1.43 L
TLC % pred: 59 %
TLC: 2.75 L

## 2021-07-10 MED ORDER — SPIRIVA RESPIMAT 2.5 MCG/ACT IN AERS: 2.0000 | INHALATION_SPRAY | Freq: Two times a day (BID) | RESPIRATORY_TRACT | 6 refills | Status: DC | PRN

## 2021-07-10 MED ORDER — SORBITOL 70 % PO SOLN: 30.0000 mL | ORAL | 6 refills | Status: DC

## 2021-07-10 MED ORDER — ALBUTEROL SULFATE (2.5 MG/3ML) 0.083% IN NEBU
2.5000 mg | INHALATION_SOLUTION | Freq: Four times a day (QID) | RESPIRATORY_TRACT | 6 refills | Status: DC | PRN
Start: 2021-07-10 — End: 2023-04-26

## 2021-07-10 MED ORDER — VENTOLIN HFA 108 (90 BASE) MCG/ACT IN AERS
2.0000 | INHALATION_SPRAY | RESPIRATORY_TRACT | 6 refills | Status: DC
Start: 2021-07-10 — End: 2022-04-08

## 2021-07-10 NOTE — Progress Notes (Signed)
@Patient  ID: Jodi Bell, female    DOB: 1960-05-07, 61 y.o.   MRN: [id number]  Chief Complaint  Patient presents with   Follow-up    Pt states that her breathing is doing well since last office visit. She states she is just getting over a cold. Pt wear her POC when she is up and active at 3L. Pt is on Albuterol as needed.     Referring provider: [name]., MD  HPI:   61 y.o. woman whom we are seeing in consultation for evaluation of dyspnea on exertion, chronic hypoxemic respiratory failure.  Recent discharge summary 06/2021 in the setting of adenovirus infection and volume overload reviewed..  Since last visit, 61 doing overall okay.  Unfortunately she developed adenovirus infection a few weeks ago.  Ended up in the hospital with mildly worsened hypoxemia.  Worsening shortness of breath.  Also to be mildly volume overloaded as well.  Likely combination of the 2 sent her over the edge.  Since discharge he is doing okay.  Reports good adherence to dialysis sessions.  She is doing albuterol nebulizers for inhaled rarely.  Does help some when she needs it.  Sometimes the pollens bother her.  She continues Spiriva daily.  Avoiding ICS given severe thrush and esophageal candidiasis in the past with ICS.  Reviewed most recent chest x-ray 06/26/2021 and 06/27/2021 with similar interstitial prominence concerning for pulmonary edema.  She had pulmonary function test performed today.  Spirometry indicative of severe restriction versus air trapping without significant bronchodilator response.  Lung volumes confirm moderate restriction.  DLCO is moderately reduced when corrected for hemoglobin.  HPI at initial visit: She has chronic dyspnea.  Using oxygen for couple months now.  Checks her oxygen saturation usually stays okay while at rest, occasionally drops while at rest.  Usually drops when moving.  Some days are better than others.  Despite using 2 L occasionally drops into the 80s with exertion  per her report.  She was walk today 3 laps around the office and did not desaturate below 95%.  Reviewed at length her prior spirometry and chest imaging.  Spirometry does not show COPD.  Chest imaging repeatedly shows evidence of volume overload and pulmonary edema.  Discussed this is most likely the cause for her hypoxemia.  Suspect it waxes and wanes based on her volume status as it relates to the timing of her dialysis.  She was counseled the same.  Reviewed most recent CT chest 12-2020 that shows evidence of volume overload with interlobular septal thickening and small bilateral pleural effusions without evidence of ILD or other parenchymal abnormality.  Most recent chest x-ray 02/2021 reviewed which shows peribronchovascular opacities most consistent with pulmonary edema on my interpretation.  Reviewed TTE most recent 12/2020 that shows severe left atrial dilation, grade 2 diastolic dysfunction, elevated left atrial pressure, aortic stenosis, mitral valve regurgitation and mitral valve stenosis as well as enlarged RV with reduced function.  Counseled most likely reason for her dyspnea is volume overloaded as a relates to her chronic heart failure worsened as she cannot excrete her own urine with relied on dialysis for volume removal.  PMH: ESRD on HD, chronic diastolic heart failure, mitral and aortic valve abnormalities, hypertension Surgical history: Knee replacement, CABG Family history: Mother with history of lung cancer Social history: Former smoker, 20-pack-year history, quit 2022, lives in [place] / Pulmonary Flowsheets:   ACT:      View : No data to display.  MMRC:     View : No data to display.          Epworth:      View : No data to display.          Tests:   FENO:  No results found for: NITRICOXIDE  PFT:    Latest Ref Rng & Units 07/10/2021    9:35 AM 09/11/2020    8:33 AM  PFT Results  FVC-Pre L 1.10  P 1.28    FVC-Predicted Pre %  47  P 52    FVC-Post L 1.25  P   FVC-Predicted Post % 54  P   Pre FEV1/FVC % % 83  P 87    Post FEV1/FCV % % 83  P   FEV1-Pre L 0.91  P 1.11    FEV1-Predicted Pre % 50  P 57    FEV1-Post L 1.03  P   DLCO uncorrected ml/min/mmHg 8.68  P   DLCO UNC% % 47  P   DLCO corrected ml/min/mmHg 10.74  P   DLCO COR %Predicted % 59  P   DLVA Predicted % 89  P   TLC L 2.75  P   TLC % Predicted % 59  P   RV % Predicted % 77  P     P Preliminary result   07/10/21 personally reviewed interpreted as: Spirometry indicative of severe restriction versus air trapping without significant bronchodilator response.  Lung volumes confirm moderate restriction.  DLCO is moderately reduced when corrected for hemoglobin.  Prior spirometry 09/2020 reviewed interpreted as no fixed obstruction, suggestive of moderate restriction versus gas trapping.  WALK:      View : No data to display.          Imaging: Personally reviewed and as per EMR discussion this note DG CHEST PORT 1 VIEW  Result Date: 06/27/2021 CLINICAL DATA:  Shortness of breath EXAM: PORTABLE CHEST 1 VIEW COMPARISON:  06/26/2021 FINDINGS: Bilateral diffuse interstitial thickening with right infrahilar airspace disease. No pleural effusion or pneumothorax. Stable cardiomegaly. Prior median sternotomy. Prior CABG. No acute osseous abnormality. IMPRESSION: 1. Findings concerning for mild CHF. Right infrahilar airspace disease which may reflect atelectasis versus pneumonia. Electronically Signed   By: Kathreen Devoid M.D.   On: 06/27/2021 10:31   DG Chest Port 1 View  Result Date: 06/26/2021 CLINICAL DATA:  SOB, dialysis patient, elevated BP EXAM: PORTABLE CHEST - 1 VIEW COMPARISON:  02/13/2021 FINDINGS: Some worsening of diffuse interstitial opacities and marked central pulmonary vascular congestion. Coarse atelectasis or scarring at the left lung base. Stable cardiomegaly. CABG marker and sternotomy wires. Aortic Atherosclerosis (ICD10-170.0). No  effusion. Cervical fixation hardware partially visualized. IMPRESSION: Worsening pulmonary vascular congestion/edema. Stable cardiomegaly Electronically Signed   By: Lucrezia Europe M.D.   On: 06/26/2021 07:00   ECHOCARDIOGRAM COMPLETE  Result Date: 06/26/2021    ECHOCARDIOGRAM REPORT   Patient Name:   Jodi Bell Date of Exam: 06/26/2021 Medical Rec #:  [id number]        Height:       62.0 in Accession #:    [id number]       Weight:       145.1 lb Date of Birth:  03-16-1960         BSA:          1.668 m Patient Age:    32 years         BP:  135/59 mmHg Patient Gender: F                HR:           87 bpm. Exam Location:  Inpatient Procedure: 2D Echo, Cardiac Doppler and Color Doppler Indications:    Dyspnea R06.00  History:        Patient has prior history of Echocardiogram examinations, most                 recent 01/02/2021. COPD, Signs/Symptoms:Dyspnea; Risk                 Factors:Diabetes, Hypertension, GERD, Sleep Apnea and Current                 Smoker.  Sonographer:    Bernadene Person RDCS Referring Phys: [id number] [place]  1. Left ventricular ejection fraction, by estimation, is 70 to 75%. The left ventricle has hyperdynamic function. The left ventricle has no regional wall motion abnormalities. There is severe left ventricular hypertrophy. Left ventricular diastolic parameters are consistent with Grade II diastolic dysfunction (pseudonormalization). Elevated left ventricular end-diastolic pressure.  2. Right ventricular systolic function is normal. The right ventricular size is normal. There is normal pulmonary artery systolic pressure.  3. Left atrial size was severely dilated.  4. Right atrial size was mildly dilated.  5. The mitral valve is degenerative. Mild mitral valve regurgitation. No evidence of mitral stenosis. Severe mitral annular calcification.  6. Tricuspid valve regurgitation is mild to moderate.  7. Visually, by planimetry, and by DI aortic valve has moderate  stenosis. However, peak velocity >4, suggesting severe stenosis. This does not appear to be explained by LVOT gradient/obstruction. The aortic valve is tricuspid. There is moderate calcification of the aortic valve. Aortic valve regurgitation is not visualized. Moderate to severe aortic valve stenosis.  8. The inferior vena cava is dilated in size with <50% respiratory variability, suggesting right atrial pressure of 15 mmHg. Comparison(s): Changes from prior study are noted. Moderate to severe aortic stenosis; peak velocity >4, but by visual/planimetry/DI suggests moderate. FINDINGS  Left Ventricle: Left ventricular ejection fraction, by estimation, is 70 to 75%. The left ventricle has hyperdynamic function. The left ventricle has no regional wall motion abnormalities. The left ventricular internal cavity size was normal in size. There is severe left ventricular hypertrophy. Left ventricular diastolic parameters are consistent with Grade II diastolic dysfunction (pseudonormalization). Elevated left ventricular end-diastolic pressure. Right Ventricle: The right ventricular size is normal. Right vetricular wall thickness was not well visualized. Right ventricular systolic function is normal. There is normal pulmonary artery systolic pressure. The tricuspid regurgitant velocity is 2.58 m/s, and with an assumed right atrial pressure of 8 mmHg, the estimated right ventricular systolic pressure is 66.5 mmHg. Left Atrium: Left atrial size was severely dilated. Right Atrium: Right atrial size was mildly dilated. Pericardium: Trivial pericardial effusion is present. Mitral Valve: The mitral valve is degenerative in appearance. Severe mitral annular calcification. Mild mitral valve regurgitation. No evidence of mitral valve stenosis. Tricuspid Valve: The tricuspid valve is normal in structure. Tricuspid valve regurgitation is mild to moderate. No evidence of tricuspid stenosis. Aortic Valve: Visually, by planimetry, and by  DI aortic valve has moderate stenosis. However, peak velocity >4, suggesting severe stenosis. This does not appear to be explained by LVOT gradient/obstruction. The aortic valve is tricuspid. There is moderate calcification of the aortic valve. Aortic valve regurgitation is not visualized. Moderate to severe aortic stenosis is present. Aortic valve mean gradient  measures 41.3 mmHg. Aortic valve peak gradient measures 73.2 mmHg. Aortic valve area, by VTI measures 1.27 cm. Pulmonic Valve: The pulmonic valve was not well visualized. Pulmonic valve regurgitation is not visualized. No evidence of pulmonic stenosis. Aorta: The aortic root, ascending aorta, aortic arch and descending aorta are all structurally normal, with no evidence of dilitation or obstruction. Venous: The inferior vena cava is dilated in size with less than 50% respiratory variability, suggesting right atrial pressure of 15 mmHg. IAS/Shunts: The atrial septum is grossly normal. Additional Comments: There is a small pleural effusion in both left and right lateral regions.  LEFT VENTRICLE PLAX 2D LVIDd:         3.66 cm      Diastology LVIDs:         2.08 cm      LV e' medial:    4.60 cm/s LV PW:         1.77 cm      LV E/e' medial:  39.8 LV IVS:        2.11 cm      LV e' lateral:   7.42 cm/s LVOT diam:     2.00 cm      LV E/e' lateral: 24.7 LV SV:         108 LV SV Index:   65 LVOT Area:     3.14 cm  LV Volumes (MOD) LV vol d, MOD A2C: 101.0 ml LV vol d, MOD A4C: 133.0 ml LV vol s, MOD A2C: 28.2 ml LV vol s, MOD A4C: 47.9 ml LV SV MOD A2C:     72.8 ml LV SV MOD A4C:     133.0 ml LV SV MOD BP:      83.7 ml RIGHT VENTRICLE RV S prime:     11.30 cm/s TAPSE (M-mode): 1.8 cm LEFT ATRIUM             Index        RIGHT ATRIUM           Index LA diam:        4.60 cm 2.76 cm/m   RA Area:     18.20 cm LA Vol (A2C):   92.3 ml 55.34 ml/m  RA Volume:   44.70 ml  26.80 ml/m LA Vol (A4C):   86.8 ml 52.04 ml/m LA Biplane Vol: 91.0 ml 54.56 ml/m  AORTIC VALVE AV  Area (Vmax):    1.28 cm AV Area (Vmean):   1.25 cm AV Area (VTI):     1.27 cm AV Vmax:           427.67 cm/s AV Vmean:          301.333 cm/s AV VTI:            0.852 m AV Peak Grad:      73.2 mmHg AV Mean Grad:      41.3 mmHg LVOT Vmax:         174.00 cm/s LVOT Vmean:        120.000 cm/s LVOT VTI:          0.343 m LVOT/AV VTI ratio: 0.40  AORTA Ao Root diam: 2.90 cm Ao Asc diam:  3.70 cm MITRAL VALVE                TRICUSPID VALVE MV Area (PHT): 2.73 cm     TR Peak grad:   26.6 mmHg MV Decel Time: 278 msec     TR Vmax:  258.00 cm/s MR PISA:        1.57 cm MR PISA Radius: 0.50 cm     SHUNTS MV E velocity: 183.00 cm/s  Systemic VTI:  0.34 m MV A velocity: 116.00 cm/s  Systemic Diam: 2.00 cm MV E/A ratio:  1.58 Buford Dresser MD Electronically signed by Buford Dresser MD Signature Date/Time: 06/26/2021/8:40:43 PM    Final     Lab Results: Personally reviewed CBC    Component Value Date/Time   WBC 7.8 06/28/2021 0346   RBC 2.65 (L) 06/28/2021 0346   HGB 8.5 (L) 06/28/2021 0346   HCT 27.5 (L) 06/28/2021 0346   PLT 154 06/28/2021 0346   MCV 103.8 (H) 06/28/2021 0346   MCV 94.5 10/12/2012 1211   MCH 32.1 06/28/2021 0346   MCHC 30.9 06/28/2021 0346   RDW 14.8 06/28/2021 0346   LYMPHSABS 0.3 (L) 06/28/2021 0346   MONOABS 0.3 06/28/2021 0346   EOSABS 0.0 06/28/2021 0346   BASOSABS 0.0 06/28/2021 0346    BMET    Component Value Date/Time   NA 133 (L) 06/28/2021 0905   K 4.9 06/28/2021 0905   CL 95 (L) 06/28/2021 0905   CO2 25 06/28/2021 0905   GLUCOSE 108 (H) 06/28/2021 0905   BUN 43 (H) 06/28/2021 0905   CREATININE 7.59 (H) 06/28/2021 0905   CREATININE 1.34 (H) 09/18/2011 1400   CALCIUM 8.2 (L) 06/28/2021 0905   GFRNONAA 6 (L) 06/28/2021 0905   GFRAA 16 (L) 11/09/2019 1152    BNP    Component Value Date/Time   BNP >4,500.0 (H) 01/02/2021 1006    ProBNP No results found for: PROBNP  Specialty Problems       Pulmonary Problems   Acute respiratory  failure with hypoxia (HCC)   COPD exacerbation (HCC)   Asthma   OSA (obstructive sleep apnea)    Allergies  Allergen Reactions   3-Methyl-2-Benzothiazolinone Hydrazone Other (See Comments)    Muscle weakness muscle cramping in legs Other reaction(s): Myalgias (Muscle Pain)   Banana Anaphylaxis, Swelling and Other (See Comments)    TONGUE AND MOUTH SWELLING   Black Walnut Flavor Anaphylaxis and Itching    Walnuts   Hazelnut ([place]) Allergy Skin Test Anaphylaxis    Hazelnuts   Leflunomide And Related Other (See Comments)    Severe Headache   Lisinopril Swelling    Angioedema  Swelling of the face   No [employer] Food Allergies Anaphylaxis    Spicy Mustard 4.7.2021 Pt reports that she eats Regular Yellow Mustard Swelling and itching of tongue   Other Other (See Comments), Anaphylaxis and Swelling    Cosentyx= angioedema  Other reaction(s): Facial Edema (intolerance) Mouth itches and tongue swelling Hazel nuts and pecans also Walnuts Walnuts Renal failure Other reaction(s): Other Serotonin Syndrome  Serotonin syndrome  Tremors Other reaction(s): Other Tremors Muscle weakness muscle cramping in legs Other reaction(s): Myalgias (Muscle Pain) Mouth itches and tongue swelling Hazel nuts and pecans also Walnuts    Pecan Extract Allergy Skin Test Anaphylaxis and Itching    Pecans    Pecan Nut (Diagnostic) Anaphylaxis and Itching    Pecans   Trazodone And Nefazodone Other (See Comments)    Serotonin Syndrome  Tremors   Adalimumab Other (See Comments)    Blisters on abdomen =humira   Escitalopram Oxalate Other (See Comments)    Hand problems - Serotonin Syndrome     Ezetimibe Other (See Comments)    myalgias cramps    Secukinumab Swelling    Cosentyx = angiodema  Statins Other (See Comments)    Leg pains and weakness   Ferrlecit [Na Ferric Gluc Cplx In Sucrose]     Not reaction given from HD   Gabapentin Other (See Comments)    tremors   Ibuprofen  Other (See Comments)    Renal Problems    Immunization History  Administered Date(s) Administered   Influenza Split 12/25/2008, 11/12/2010, 11/06/2012, 11/10/2013, 01/02/2014, 11/11/2015, 11/13/2017, 10/29/2018   Influenza, Seasonal, Injecte, Preservative Fre 12/25/2008, 11/12/2010, 11/06/2012   Influenza,inj,Quad PF,6+ Mos 01/02/2014, 11/13/2017, 10/29/2018   Influenza,inj,Quad PF,6-35 Mos 11/24/2011, 10/27/2012, 11/03/2012, 10/29/2018   Influenza,inj,quad, With Preservative 11/01/2019   Influenza-Unspecified 12/25/2008, 11/12/2010, 11/06/2012, 11/10/2013, 01/02/2014, 11/11/2015, 11/13/2017, 10/29/2018   Moderna Sars-Covid-2 Vaccination 04/30/2019, 06/01/2019   Pneumococcal Conjugate-13 03/17/2010, 07/01/2019   Pneumococcal Polysaccharide-23 02/10/2005, 12/24/2008, 12/11/2012, 04/21/2013, 03/09/2020   Tdap 01/13/2018    Past Medical History:  Diagnosis Date   Anemia of chronic disease    Anxiety    Arthritis    oa and psoriatic ra   Asthma    Back pain, chronic    lower back   Candida infection finished tx 2 days ago   Chronic insomnia    COPD (chronic obstructive pulmonary disease) ([place])    Coronary artery calcification seen on CT scan    Diabetes mellitus    type 2   Eczema    ESRD on dialysis [facility])    Essential hypertension    GERD (gastroesophageal reflux disease)    Gout    History of hiatal hernia    Hypoalbuminemia    Hyponatremia    Mild aortic stenosis    Mitral regurgitation    Mitral stenosis    Obesity    PAD (peripheral artery disease) ([place])    a. externial iliac calcification seen on CT 04/2020.   Pneumonia few yrs ago x 2   Sleep apnea    Tobacco abuse    Upper GI bleed 05/2019    Tobacco History: Social History   Tobacco Use  Smoking Status Former   Packs/day: 0.50   Years: 40.00   Pack years: 20.00   Types: Cigarettes   Quit date: 08/2020   Years since quitting: 0.9  Smokeless Tobacco Never   Counseling given: Not  Answered   Continue to not smoke  Outpatient Encounter Medications as of 07/10/2021  Medication Sig   acetaminophen (TYLENOL) 500 MG tablet Take 1,000 mg by mouth daily as needed (pain).   allopurinol (ZYLOPRIM) 100 MG tablet Take 100 mg by mouth every evening.   amLODipine (NORVASC) 10 MG tablet Take 10 mg by mouth daily.   B Complex-C-Folic Acid (RENAL-VITE PO) Take 1 tablet by mouth daily.   calcium acetate (PHOSLO) 667 MG capsule Take 1,334 mg by mouth See admin instructions. Take 1,334 by mouth twice a day when able to remember.   carboxymethylcellulose 1 % ophthalmic solution Place 2 drops into both eyes 2 (two) times daily as needed (dry eyes).   cetirizine (ZYRTEC) 10 MG tablet Take 10 mg by mouth daily as needed for allergies.   cholecalciferol (VITAMIN D) 25 MCG tablet Take 1 tablet (1,000 Units total) by mouth every evening.   cholecalciferol (VITAMIN D3) 10 MCG (400 UNIT) TABS tablet Take 800 Units by mouth daily.   cinacalcet (SENSIPAR) 30 MG tablet Take 30 mg by mouth daily.   cloNIDine (CATAPRES) 0.1 MG tablet Take 0.1 mg by mouth 3 (three) times daily.   cyclobenzaprine (FLEXERIL) 10 MG tablet Take 10 mg by mouth 2 (  two) times daily as needed for muscle spasms.   Darbepoetin Alfa (ARANESP) 200 MCG/0.4ML SOSY injection Inject 0.4 mLs (200 mcg total) into the vein every Wednesday with hemodialysis.   Diclofenac Sodium (VOLTAREN EX) Apply 1 application. topically daily as needed (pain).   docusate sodium (COLACE) 100 MG capsule Take 100-200 mg by mouth daily as needed for mild constipation.   ferrous sulfate 325 (65 FE) MG tablet Take 325 mg by mouth See admin instructions. Take 325 mg by mouth three times a week   folic acid (FOLVITE) 453 MCG tablet Take 400 mcg by mouth daily.   heparin 1000 unit/mL SOLN injection 3 mLs (3,000 Units total) by Dialysis route as needed (in dialysis as needed).   heparin 1000 unit/mL SOLN injection 2 mLs (2,000 Units total) by Dialysis route as  needed (in dialysis as needed).   heparin 1000 unit/mL SOLN injection 1 mL (1,000 Units total) by Dialysis route as needed (in dialysis).   hydrALAZINE (APRESOLINE) 100 MG tablet Take 1 tablet (100 mg total) by mouth every 8 (eight) hours. (Patient taking differently: Take 100 mg by mouth 3 (three) times daily.)   hydrocortisone (ANUSOL-HC) 25 MG suppository Place 25 mg rectally daily as needed for hemorrhoids.   Ibuprofen-Acetaminophen (ADVIL DUAL ACTION PO) Take 3 tablets by mouth 2 (two) times daily as needed (pain).   isosorbide mononitrate (IMDUR) 30 MG 24 hr tablet Take 1 tablet (30 mg total) by mouth daily.   labetalol (NORMODYNE) 200 MG tablet TAKE 1 TABLET BY MOUTH THREE TIMES DAILY (Patient taking differently: Take 200 mg by mouth 3 (three) times daily.)   lidocaine-prilocaine (EMLA) cream Apply 1 application. topically 2 (two) times daily as needed (pain in fingers).   LORazepam (ATIVAN) 1 MG tablet Take 1 mg by mouth every 8 (eight) hours as needed for anxiety.   montelukast (SINGULAIR) 10 MG tablet Take 10 mg by mouth daily with supper.    multivitamin (RENA-VIT) TABS tablet Take 1 tablet by mouth daily with supper.   olmesartan (BENICAR) 40 MG tablet Take 40 mg by mouth every evening.   ondansetron (ZOFRAN) 4 MG tablet Take 4 mg by mouth 2 (two) times daily as needed for nausea or vomiting.   OVER THE COUNTER MEDICATION Take by mouth daily as needed (sickness). Elderberry Fruit and flower 1 dropper full by mouth daily as needed for sickness   Oxycodone HCl 10 MG TABS Take 10 mg by mouth daily as needed for pain.   OXYGEN Inhale 2 L into the lungs as directed.   pantoprazole (PROTONIX) 40 MG tablet Take 40 mg by mouth daily.   polyethylene glycol powder (GLYCOLAX/MIRALAX) 17 GM/SCOOP powder Mix 17 grams into 8 ounces of liquid and drink by mouth daily. (Patient taking differently: Take 17 g by mouth daily as needed (constipation).)   predniSONE (DELTASONE) 5 MG tablet Take 4 tablets  (20 mg total) by mouth daily with breakfast for 2 days, THEN 1 tablet (5 mg total) daily with breakfast.   Probiotic Product (PROBIOTIC PO) Take 1 capsule by mouth daily.   Tiotropium Bromide Monohydrate (SPIRIVA RESPIMAT) 2.5 MCG/ACT AERS Inhale 2 puffs into the lungs 2 (two) times daily as needed (wheezing/SOB).   zolpidem (AMBIEN) 5 MG tablet Take 5 mg by mouth at bedtime as needed for sleep.   [DISCONTINUED] albuterol (PROVENTIL) (2.5 MG/3ML) 0.083% nebulizer solution Take 2.5 mg by nebulization every 6 (six) hours as needed for wheezing or shortness of breath.   [DISCONTINUED] sorbitol 70 %  solution Take 30 mLs by mouth See admin instructions. Take 30 ml one- two times daily as needed for constipation   [DISCONTINUED] Tiotropium Bromide Monohydrate (SPIRIVA RESPIMAT IN) Inhale 2 puffs into the lungs 2 (two) times daily as needed (wheezing/SOB).   [DISCONTINUED] VENTOLIN HFA 108 (90 Base) MCG/ACT inhaler Inhale 2 puffs into the lungs See admin instructions. Take 2 puffs every 4-6 hours as needed for wheezing and shortness of breath   albuterol (PROVENTIL) (2.5 MG/3ML) 0.083% nebulizer solution Take 3 mLs (2.5 mg total) by nebulization every 6 (six) hours as needed for wheezing or shortness of breath.   gabapentin (NEURONTIN) 300 MG capsule Take 1 capsule (300 mg total) by mouth at bedtime as needed (Neuropathic pain). (Patient taking differently: Take 300 mg by mouth daily as needed (Neuropathic pain).)   sorbitol 70 % solution Take 30 mLs by mouth See admin instructions. Take 30 ml one- two times daily as needed for constipation   VENTOLIN HFA 108 (90 Base) MCG/ACT inhaler Inhale 2 puffs into the lungs See admin instructions. Take 2 puffs every 4-6 hours as needed for wheezing and shortness of breath   [DISCONTINUED] Calcium Carbonate Antacid (TUMS PO) Take 2-3 tablets by mouth daily as needed (acid reflux).   No facility-administered encounter medications on file as of 07/10/2021.     Review  of Systems  Review of Systems  N/a Physical Exam  BP 136/68 (BP Location: Left Arm, Patient Position: Sitting, Cuff Size: Normal)   Pulse 77   Temp 98.1 F (36.7 C) (Oral)   Ht 5\' 1"  (1.549 m)   Wt 140 lb 6.4 oz (63.7 kg)   SpO2 97%   BMI 26.53 kg/m   Wt Readings from Last 5 Encounters:  07/10/21 140 lb 6.4 oz (63.7 kg)  06/28/21 135 lb 2.3 oz (61.3 kg)  05/14/21 145 lb (65.8 kg)  05/02/21 136 lb 11 oz (62 kg)  04/17/21 136 lb 11 oz (62 kg)    BMI Readings from Last 5 Encounters:  07/10/21 26.53 kg/m  06/28/21 24.72 kg/m  05/14/21 26.52 kg/m  05/02/21 25.00 kg/m  04/17/21 25.00 kg/m     Physical Exam Sitting in chair, in no acute distress Eyes: EOMI, no icterus Neck: Supple, JVP Pulmonary: Normal work of breathing, clear Abdomen: Nondistended, bowel sounds present MSK: No synovitis, joint effusion Abdomen: Nondistended, bowel sounds present Neuro: Normal gait, no weakness Psych: Normal mood, full affect   Assessment & Plan:   Dyspnea on exertion: Likely related to cardiac causes as primary driver given left atrial dilation, elevated left atrial pressure, mitral and aortic valvular abnormalities with serial chest imaging demonstrating pulmonary edema.  Prior spirometry without fixed obstruction.  Full PFT 07/10/2021 concerning for moderate restriction with moderately decreased DLCO.  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  She had a CT scan 12/2020, prior to [place], this shows no evidence of ILD.  Recent TTE with normal RV function and normal estimated PASP.  This is reassuring.  CT chest high-res ordered for further evaluation.  Chronic hypoxemic respiratory failure: 2-3 L with exertion per her report.  Using POC. Prior cross-sectional imaging without evidence of ILD.  Imaging consistently shows pulmonary edema and volume overload.  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  Volume  overload most likely contributor.  Given worsening symptoms after COVID infection and restriction with reduced DLCO on PFTs, CT chest high-res ordered for further evaluation.   Return in about 3  months (around 10/10/2021).   Lanier Clam, MD 07/10/2021

## 2021-07-10 NOTE — Progress Notes (Signed)
PFT done today. 

## 2021-07-10 NOTE — Patient Instructions (Signed)
Nice to see you again  Based on your pulmonary function test today, I think would be wise to get a CT scan to see if there is longstanding damage from the COVID infection you had several months ago.  This has been ordered.  Some will call you to schedule.  I refilled your inhaler medications today.  No changes to those.  Return to clinic in 3 months or sooner as needed with Dr. Silas Flood

## 2021-07-11 ENCOUNTER — Encounter: Payer: Self-pay | Admitting: Physician Assistant

## 2021-07-11 ENCOUNTER — Ambulatory Visit (INDEPENDENT_AMBULATORY_CARE_PROVIDER_SITE_OTHER): Payer: Medicare PPO | Admitting: Physician Assistant

## 2021-07-11 VITALS — BP 134/60 | HR 82 | Ht 61.0 in | Wt 146.0 lb

## 2021-07-11 DIAGNOSIS — I480 Paroxysmal atrial fibrillation: Secondary | ICD-10-CM

## 2021-07-11 DIAGNOSIS — J438 Other emphysema: Secondary | ICD-10-CM

## 2021-07-11 DIAGNOSIS — I350 Nonrheumatic aortic (valve) stenosis: Secondary | ICD-10-CM | POA: Diagnosis not present

## 2021-07-11 DIAGNOSIS — Z951 Presence of aortocoronary bypass graft: Secondary | ICD-10-CM

## 2021-07-11 DIAGNOSIS — I2510 Atherosclerotic heart disease of native coronary artery without angina pectoris: Secondary | ICD-10-CM

## 2021-07-11 MED ORDER — AMLODIPINE BESYLATE 5 MG PO TABS: 5.0000 mg | ORAL_TABLET | Freq: Every day | ORAL | 3 refills | Status: DC

## 2021-07-11 MED ORDER — LABETALOL HCL 200 MG PO TABS: 400.0000 mg | ORAL_TABLET | Freq: Three times a day (TID) | ORAL | 3 refills | Status: DC

## 2021-07-11 NOTE — Patient Instructions (Addendum)
Medication Instructions:  Your physician has recommended you make the following change in your medication:  1.Increase labetalol to 400 mg by mouth 3 times a day 2.Decrease norvasc to 5 mg by mouth daily *If you need a refill on your cardiac medications before your next appointment, please call your pharmacy*   Lab Work: None If you have labs (blood work) drawn today and your tests are completely normal, you will receive your results only by: [place] (if you have MyChart) OR A paper copy in the mail If you have any lab test that is abnormal or we need to change your treatment, we will call you to review the results.  Follow-Up: At [facility], you and your health needs are our priority.  As part of our continuing mission to provide you with exceptional heart care, we have created designated Provider Care Teams.  These Care Teams include your primary Cardiologist (physician) and Advanced Practice Providers (APPs -  Physician Assistants and Nurse Practitioners) who all work together to provide you with the care you need, when you need it.  Your next appointment:   08/02/21 at 9:15 AM  The format for your next appointment:   In Person  Provider:   Robbie Lis, PA-C     {  Important Information About [employer]

## 2021-07-11 NOTE — Progress Notes (Signed)
Cardiology Office Note:    Date:  07/11/2021   ID:  Jodi Bell, DOB [birth year], MRN [id number]  PCP:  [name]., MD  [facility] Cardiologist:  Candee Furbish, MD  [facility] Electrophysiologist:  None   Chief Complaint: Chest pain/palpitation  History of Present Illness:    Jodi Bell is a 61 y.o. female with a hx of COPD/chronic hypoxic respiratory failure on 2-3 L oxygen, CAD s/p CABG x 2 09/2020, HTN, ESRD on HD (MWF), OSA (non compliance with CPAP), PAF (postoperatively), Aortic stenosis, mitral stenosis, and DM seen for chest pain and palpitation.  Patient with history of postoperative atrial fibrillation.  Her amiodarone was discontinued when last seen by Dr. Marlou Porch December 2022.  Admitted 06/2021 for COPD exacerbation and volume overload.  Echo 06/26/21 with LVEF 70-75%, severe LVH, grade II DD, elevated LVEDP. Normal RV function and PASP. Severely dilated LA. Mild MR without stenosis. Moderate to severe aortic valve stenosis with Aortic valve mean gradient measures 41.3 mmHg.  Patient has chronic dyspnea on exertion. Per pulmonologist 07/10/21 ": Likely related to cardiac causes as primary driver given left atrial dilation, elevated left atrial pressure, mitral and aortic valvular abnormalities with serial chest imaging demonstrating pulmonary edema.  Prior spirometry without fixed obstruction.  Full PFT 07/10/2021 concerning for moderate restriction with moderately decreased DLCO.  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  She had a CT scan 12/2020, prior to [place], this shows no evidence of ILD.  Recent TTE with normal RV function and normal estimated PASP.  This is reassuring.  CT chest high-res ordered for further evaluation".  Patient reports episode of chest pain this week on Monday and Tuesday.  Reported sharp achy feeling intermittently lasting for few minutes.  She increased her Imdur to 60 mg  for 2 days and now back to 30 mg daily.  Symptoms resolved.  She also had episode of palpitation Monday night lasting for 10 to 15 minutes.  No associated chest pain at that time.  Her heart rate was fluctuating.  She denies lower extremity edema, melena, dizziness, orthopnea or PND.  Has stable chronic shortness of breath.  Past Medical History:  Diagnosis Date   Anemia of chronic disease    Anxiety    Arthritis    oa and psoriatic ra   Asthma    Back pain, chronic    lower back   Candida infection finished tx 2 days ago   Chronic insomnia    COPD (chronic obstructive pulmonary disease) (HCC)    Coronary artery calcification seen on CT scan    Diabetes mellitus    type 2   Eczema    ESRD on dialysis [facility])    Essential hypertension    GERD (gastroesophageal reflux disease)    Gout    History of hiatal hernia    Hypoalbuminemia    Hyponatremia    Mild aortic stenosis    Mitral regurgitation    Mitral stenosis    Obesity    PAD (peripheral artery disease) ([place])    a. externial iliac calcification seen on CT 04/2020.   Pneumonia few yrs ago x 2   Sleep apnea    Tobacco abuse    Upper GI bleed 05/2019    Past Surgical History:  Procedure Laterality Date   BACK SURGERY  2016   lower back fusion with cage   BIOPSY  09/23/2017   Procedure: BIOPSY;  Surgeon: Wonda Horner, MD;  Location: [facility];  Service: Endoscopy;;   BIOPSY  05/19/2019   Procedure: BIOPSY;  Surgeon: Otis Brace, MD;  Location: [place];  Service: Gastroenterology;;   CESAREAN SECTION  1990   x 1    COLONOSCOPY WITH PROPOFOL N/A 09/23/2017   Procedure: COLONOSCOPY WITH PROPOFOL Hemostatic clips placed;  Surgeon: Wonda Horner, MD;  Location: [facility];  Service: Endoscopy;  Laterality: N/A;   COLONOSCOPY WITH PROPOFOL N/A 10/02/2020   Procedure: COLONOSCOPY WITH PROPOFOL;  Surgeon: Arta Silence, MD;  Location: [place];  Service: Endoscopy;  Laterality: N/A;   CORONARY ARTERY BYPASS  GRAFT N/A 09/13/2020   Procedure: CORONARY ARTERY BYPASS GRAFTING (CABG), ON PUMP, TIMES TWO, USING LEFT INTERNAL MAMMARY ARTERY AND RIGHT ENDOSCOPICALLY HARVESTED GREATER SAPHENOUS VEIN;  Surgeon: Gaye Pollack, MD;  Location: [place];  Service: Open Heart Surgery;  Laterality: N/A;   DILATION AND CURETTAGE OF UTERUS  1988   ESOPHAGOGASTRODUODENOSCOPY N/A 09/22/2020   Procedure: ESOPHAGOGASTRODUODENOSCOPY (EGD);  Surgeon: Arta Silence, MD;  Location: [facility] ENDOSCOPY;  Service: Endoscopy;  Laterality: N/A;   ESOPHAGOGASTRODUODENOSCOPY (EGD) WITH PROPOFOL N/A 09/23/2017   Procedure: ESOPHAGOGASTRODUODENOSCOPY (EGD) WITH PROPOFOL;  Surgeon: Wonda Horner, MD;  [facility];  Service: Endoscopy;  Laterality: N/A;   ESOPHAGOGASTRODUODENOSCOPY (EGD) WITH PROPOFOL N/A 05/19/2019   Procedure: ESOPHAGOGASTRODUODENOSCOPY (EGD) WITH PROPOFOL;  Surgeon: Otis Brace, MD;  Location: [place];  Service: Gastroenterology;  Laterality: N/A;   ESOPHAGOGASTRODUODENOSCOPY (EGD) WITH PROPOFOL N/A 10/02/2020   Procedure: ESOPHAGOGASTRODUODENOSCOPY (EGD) WITH PROPOFOL;  Surgeon: Arta Silence, MD;  Location: [place];  Service: Endoscopy;  Laterality: N/A;   GIVENS CAPSULE STUDY N/A 05/19/2019   Procedure: GIVENS CAPSULE STUDY;  Surgeon: Otis Brace, MD;  Location: [place];  Service: Gastroenterology;  Laterality: N/A;   HEMOSTASIS CLIP PLACEMENT  09/22/2020   Procedure: HEMOSTASIS CLIP PLACEMENT;  Surgeon: Arta Silence, MD;  Location: [place];  Service: Endoscopy;;   HEMOSTASIS CONTROL  09/22/2020   Procedure: HEMOSTASIS CONTROL;  Surgeon: Arta Silence, MD;  Location: [place];  Service: Endoscopy;;   HOT HEMOSTASIS N/A 05/19/2019   Procedure: HOT HEMOSTASIS (ARGON PLASMA COAGULATION/BICAP);  Surgeon: Otis Brace, MD;  Location: [facility] ENDOSCOPY;  Service: Gastroenterology;  Laterality: N/A;   LEFT HEART CATH AND CORONARY ANGIOGRAPHY N/A 09/10/2020   Procedure: LEFT HEART CATH  AND CORONARY ANGIOGRAPHY;  Surgeon: Nelva Bush, MD;  Location: [place] CV LAB;  Service: Cardiovascular;  Laterality: N/A;   PARTIAL KNEE ARTHROPLASTY Left 11/12/2017   Procedure: left unicompartmental arthroplasty-medial;  Surgeon: Paralee Cancel, MD;  Location: [facility];  Service: Orthopedics;  Laterality: Left;  78min   SUBMUCOSAL INJECTION  09/23/2017   Procedure: SUBMUCOSAL INJECTION;  Surgeon: Wonda Horner, MD;  Location: [facility];  Service: Endoscopy;;  in colon   TEE WITHOUT CARDIOVERSION N/A 09/13/2020   Procedure: TRANSESOPHAGEAL ECHOCARDIOGRAM (TEE);  Surgeon: Gaye Pollack, MD;  Location: [place];  Service: Open Heart Surgery;  Laterality: N/A;   TUBAL LIGATION  1990    Current Medications: Current Meds  Medication Sig   acetaminophen (TYLENOL) 500 MG tablet Take 1,000 mg by mouth daily as needed (pain).   albuterol (PROVENTIL) (2.5 MG/3ML) 0.083% nebulizer solution Take 3 mLs (2.5 mg total) by nebulization every 6 (six) hours as needed for wheezing or shortness of breath.   allopurinol (ZYLOPRIM) 100 MG tablet Take 100 mg by mouth every evening.   amLODipine (NORVASC) 5 MG tablet Take 1 tablet (5 mg total) by mouth  daily.   B Complex-C-Folic Acid (RENAL-VITE PO) Take 1 tablet by mouth daily.   calcium acetate (PHOSLO) 667 MG capsule Take 1,334 mg by mouth See admin instructions. Take 1,334 by mouth twice a day when able to remember.   carboxymethylcellulose 1 % ophthalmic solution Place 2 drops into both eyes 2 (two) times daily as needed (dry eyes).   cetirizine (ZYRTEC) 10 MG tablet Take 10 mg by mouth daily as needed for allergies.   cholecalciferol (VITAMIN D) 25 MCG tablet Take 1 tablet (1,000 Units total) by mouth every evening.   cholecalciferol (VITAMIN D3) 10 MCG (400 UNIT) TABS tablet Take 800 Units by mouth daily.   cinacalcet (SENSIPAR) 30 MG tablet Take 30 mg by mouth daily.   cloNIDine (CATAPRES) 0.1 MG tablet Take 0.1 mg by mouth 3 (three) times daily.    cyclobenzaprine (FLEXERIL) 10 MG tablet Take 10 mg by mouth 2 (two) times daily as needed for muscle spasms.   Darbepoetin Alfa (ARANESP) 200 MCG/0.4ML SOSY injection Inject 0.4 mLs (200 mcg total) into the vein every Wednesday with hemodialysis.   Diclofenac Sodium (VOLTAREN EX) Apply 1 application. topically daily as needed (pain).   docusate sodium (COLACE) 100 MG capsule Take 100-200 mg by mouth daily as needed for mild constipation.   ferrous sulfate 325 (65 FE) MG tablet Take 325 mg by mouth See admin instructions. Take 325 mg by mouth three times a week   folic acid (FOLVITE) 277 MCG tablet Take 400 mcg by mouth daily.   heparin 1000 unit/mL SOLN injection 3 mLs (3,000 Units total) by Dialysis route as needed (in dialysis as needed).   heparin 1000 unit/mL SOLN injection 2 mLs (2,000 Units total) by Dialysis route as needed (in dialysis as needed).   heparin 1000 unit/mL SOLN injection 1 mL (1,000 Units total) by Dialysis route as needed (in dialysis).   hydrALAZINE (APRESOLINE) 100 MG tablet Take 1 tablet (100 mg total) by mouth every 8 (eight) hours. (Patient taking differently: Take 100 mg by mouth 3 (three) times daily.)   hydrocortisone (ANUSOL-HC) 25 MG suppository Place 25 mg rectally daily as needed for hemorrhoids.   Ibuprofen-Acetaminophen (ADVIL DUAL ACTION PO) Take 3 tablets by mouth 2 (two) times daily as needed (pain).   isosorbide mononitrate (IMDUR) 30 MG 24 hr tablet Take 1 tablet (30 mg total) by mouth daily.   lidocaine-prilocaine (EMLA) cream Apply 1 application. topically 2 (two) times daily as needed (pain in fingers).   LORazepam (ATIVAN) 1 MG tablet Take 1 mg by mouth every 8 (eight) hours as needed for anxiety.   montelukast (SINGULAIR) 10 MG tablet Take 10 mg by mouth daily with supper.    multivitamin (RENA-VIT) TABS tablet Take 1 tablet by mouth daily with supper.   olmesartan (BENICAR) 40 MG tablet Take 40 mg by mouth every evening.   ondansetron (ZOFRAN) 4 MG  tablet Take 4 mg by mouth 2 (two) times daily as needed for nausea or vomiting.   OVER THE COUNTER MEDICATION Take by mouth daily as needed (sickness). Elderberry Fruit and flower 1 dropper full by mouth daily as needed for sickness   Oxycodone HCl 10 MG TABS Take 10 mg by mouth daily as needed for pain.   OXYGEN Inhale 2 L into the lungs as directed.   pantoprazole (PROTONIX) 40 MG tablet Take 40 mg by mouth daily.   polyethylene glycol powder (GLYCOLAX/MIRALAX) 17 GM/SCOOP powder Mix 17 grams into 8 ounces of liquid and drink by mouth  daily. (Patient taking differently: Take 17 g by mouth daily as needed (constipation).)   predniSONE (DELTASONE) 5 MG tablet Take 4 tablets (20 mg total) by mouth daily with breakfast for 2 days, THEN 1 tablet (5 mg total) daily with breakfast.   Probiotic Product (PROBIOTIC PO) Take 1 capsule by mouth daily.   sorbitol 70 % solution Take 30 mLs by mouth See admin instructions. Take 30 ml one- two times daily as needed for constipation   Tiotropium Bromide Monohydrate (SPIRIVA RESPIMAT) 2.5 MCG/ACT AERS Inhale 2 puffs into the lungs 2 (two) times daily as needed (wheezing/SOB).   VENTOLIN HFA 108 (90 Base) MCG/ACT inhaler Inhale 2 puffs into the lungs See admin instructions. Take 2 puffs every 4-6 hours as needed for wheezing and shortness of breath   zolpidem (AMBIEN) 5 MG tablet Take 5 mg by mouth at bedtime as needed for sleep.   [DISCONTINUED] amLODipine (NORVASC) 10 MG tablet Take 10 mg by mouth daily.   [DISCONTINUED] labetalol (NORMODYNE) 200 MG tablet TAKE 1 TABLET BY MOUTH THREE TIMES DAILY (Patient taking differently: Take 200 mg by mouth 3 (three) times daily.)     Allergies:   3-methyl-2-benzothiazolinone hydrazone, Banana, Black walnut flavor, Hazelnut (filbert) allergy skin test, Leflunomide and related, Lisinopril, No healthtouch food allergies, Other, Pecan extract allergy skin test, Pecan nut (diagnostic), Trazodone and nefazodone, Adalimumab,  Escitalopram oxalate, Ezetimibe, Secukinumab, Statins, Ferrlecit [na ferric gluc cplx in sucrose], Gabapentin, and Ibuprofen   Social History   Socioeconomic History   Marital status: Divorced    Spouse name: BASIL   Number of children: 3   Years of education: 1   Highest education level: GED or equivalent  Occupational History   Occupation: retired Corporate treasurer  Tobacco Use   Smoking status: Former    Packs/day: 0.50    Years: 40.00    Pack years: 20.00    Types: Cigarettes    Quit date: 08/2020    Years since quitting: 0.9   Smokeless tobacco: Never  Vaping Use   Vaping Use: Never used  Substance and Sexual Activity   Alcohol use: Yes    Comment: occ   Drug use: No   Sexual activity: Not on file  Other Topics Concern   Not on file  Social History Narrative   Not on file   Social Determinants of Health   Financial Resource Strain: Not on file  Food Insecurity: Not on file  Transportation Needs: Not on file  Physical Activity: Not on file  Stress: Not on file  Social Connections: Not on file     Family History: The patient's family history includes Arthritis in her daughter and daughter; Asthma in her daughter; Diabetes in her sister; Heart disease in her sister and another family member; Lung cancer in her mother; Thyroid cancer in an other family member.    ROS:   Please see the history of present illness.    All other systems reviewed and are negative.   EKGs/Labs/Other Studies Reviewed:    The following studies were reviewed today:  Echo 06/26/21 IMPRESSIONS     1. Left ventricular ejection fraction, by estimation, is 70 to 75%. The  left ventricle has hyperdynamic function. The left ventricle has no  regional wall motion abnormalities. There is severe left ventricular  hypertrophy. Left ventricular diastolic  parameters are consistent with Grade II diastolic dysfunction  (pseudonormalization). Elevated left ventricular end-diastolic pressure.   2. Right  ventricular systolic function is normal. The right ventricular  size is normal. There is normal pulmonary artery systolic pressure.   3. Left atrial size was severely dilated.   4. Right atrial size was mildly dilated.   5. The mitral valve is degenerative. Mild mitral valve regurgitation. No  evidence of mitral stenosis. Severe mitral annular calcification.   6. Tricuspid valve regurgitation is mild to moderate.   7. Visually, by planimetry, and by DI aortic valve has moderate stenosis.  However, peak velocity >4, suggesting severe stenosis. This does not  appear to be explained by LVOT gradient/obstruction. The aortic valve is  tricuspid. There is moderate  calcification of the aortic valve. Aortic valve regurgitation is not  visualized. Moderate to severe aortic valve stenosis.   8. The inferior vena cava is dilated in size with <50% respiratory  variability, suggesting right atrial pressure of 15 mmHg.   Comparison(s): Changes from prior study are noted. Moderate to severe  aortic stenosis; peak velocity >4, but by visual/planimetry/DI suggests  moderate.   FINDINGS   Left Ventricle: Left ventricular ejection fraction, by estimation, is 70  to 75%. The left ventricle has hyperdynamic function. The left ventricle  has no regional wall motion abnormalities. The left ventricular internal  cavity size was normal in size.  There is severe left ventricular hypertrophy. Left ventricular diastolic  parameters are consistent with Grade II diastolic dysfunction  (pseudonormalization). Elevated left ventricular end-diastolic pressure.   Right Ventricle: The right ventricular size is normal. Right vetricular  wall thickness was not well visualized. Right ventricular systolic  function is normal. There is normal pulmonary artery systolic pressure.  The tricuspid regurgitant velocity is 2.58  m/s, and with an assumed right atrial pressure of 8 mmHg, the estimated  right ventricular systolic  pressure is 67.1 mmHg.   Left Atrium: Left atrial size was severely dilated.   Right Atrium: Right atrial size was mildly dilated.   Pericardium: Trivial pericardial effusion is present.   Mitral Valve: The mitral valve is degenerative in appearance. Severe  mitral annular calcification. Mild mitral valve regurgitation. No evidence  of mitral valve stenosis.   Tricuspid Valve: The tricuspid valve is normal in structure. Tricuspid  valve regurgitation is mild to moderate. No evidence of tricuspid  stenosis.   Aortic Valve: Visually, by planimetry, and by DI aortic valve has moderate  stenosis. However, peak velocity >4, suggesting severe stenosis. This does  not appear to be explained by LVOT gradient/obstruction. The aortic valve  is tricuspid. There is  moderate calcification of the aortic valve. Aortic valve regurgitation is  not visualized. Moderate to severe aortic stenosis is present. Aortic  valve mean gradient measures 41.3 mmHg. Aortic valve peak gradient  measures 73.2 mmHg. Aortic valve area, by  VTI measures 1.27 cm.   Pulmonic Valve: The pulmonic valve was not well visualized. Pulmonic valve  regurgitation is not visualized. No evidence of pulmonic stenosis.   Aorta: The aortic root, ascending aorta, aortic arch and descending aorta  are all structurally normal, with no evidence of dilitation or  obstruction.   Venous: The inferior vena cava is dilated in size with less than 50%  respiratory variability, suggesting right atrial pressure of 15 mmHg.   IAS/Shunts: The atrial septum is grossly normal.   Additional Comments: There is a small pleural effusion in both left and  right lateral regions.       LEFT HEART CATH AND CORONARY ANGIOGRAPHY 09/2020   Conclusion  Conclusions: Severe two-vessel coronary artery disease with heavily calcified disease of up  to 80% involving the proximal and mid LAD as well as up to 90% involving the mid RCA. Mild-moderate,  non-obstructive disease noted in the LCx territory. Mildly elevated left ventricular filling pressure (LVEDP 15-20 mmHg).   Recommendations: Given severe two-vessel CAD with heavy, eccentric calcium (including in the proximal LAD) and the patient's history of diabetes, recommend cardiac surgery consultation for CABG. Improve blood pressure control and wean IV nitroglycerin, as tolerated.    EKG:  EKG is ordered today.  The ekg ordered today demonstrates normal sinus rhythm  Recent Labs: 01/02/2021: B Natriuretic Peptide >4,500.0 01/03/2021: Magnesium 1.9 06/26/2021: ALT 12 06/28/2021: BUN 43; Creatinine, Ser 7.59; Hemoglobin 8.5; Platelets 154; Potassium 4.9; Sodium 133  Recent Lipid Panel    Component Value Date/Time   CHOL 193 09/10/2020 0251   TRIG 60 09/10/2020 0251   HDL 69 09/10/2020 0251   CHOLHDL 2.8 09/10/2020 0251   VLDL 12 09/10/2020 0251   LDLCALC 112 (H) 09/10/2020 0251    Physical Exam:    VS:  BP 134/60   Pulse 82   Ht 5\' 1"  (1.549 m)   Wt 146 lb (66.2 kg)   SpO2 98%   BMI 27.59 kg/m     Wt Readings from Last 3 Encounters:  07/11/21 146 lb (66.2 kg)  07/10/21 140 lb 6.4 oz (63.7 kg)  06/28/21 135 lb 2.3 oz (61.3 kg)     GEN:  Well nourished, well developed in no acute distress HEENT: Normal NECK: No JVD; No carotid bruits LYMPHATICS: No lymphadenopathy CARDIAC: RRR, loud murmurs, rubs, gallops RESPIRATORY:  Clear to auscultation without rales, wheezing or rhonchi  ABDOMEN: Soft, non-tender, non-distended MUSCULOSKELETAL:  No edema; No deformity  SKIN: Warm and dry NEUROLOGIC:  Alert and oriented x 3 PSYCHIATRIC:  Normal affect   ASSESSMENT AND PLAN:   1.  Aortic stenosis 2.  LVH I have reviewed patient case with Dr. Marlou Porch personally who has reviewed her recent echocardiogram.  Based on pictures, Dr. Marlou Porch thinks that her aortic stenosis is likely in the mild-to-moderate range.  It reading higher likely due to LVOT gradient/hyperdynamic LV  function..  No need for further aortic valve work-up currently.  Volume managed by dialysis.  Plan to avoid dehydration/overdiuresis.  No dizziness or syncope.  Will increase beta-blocker to slow down her heart rate.  3.  Chest pain with history of CAD s/p CABG -2 days history of intermittent sharp chest pain.  Similar to prior angina.  She increased her Imdur at that time with resolution of symptoms.  She is now back on Imdur 30 mg for past 3 days without recurrent symptoms.  -Plan to get stress test if recurrent symptoms  4.  Palpitation with history of postoperative atrial fibrillation -Patient has postoperative atrial fibrillation.  She was placed on amiodarone which was discontinued during last office visit.  Her palpitation episode was upgraded from her chest pain.  Her palpitations occur only 1 night.  Heart rates are fluctuating, concerning for A-fib. -Increase beta-blocker as above -We will get monitor if recurrent symptoms.  Mutual agreement.  5.  COPD -Per pulmonary  6.  End-stage renal disease on hemodialysis -Fluid managed by dialysis.   7. HTN - Increase BB. Reduce Norvasc. Keep log and bring during next OV.      Medication Adjustments/Labs and Tests Ordered: Current medicines are reviewed at length with the patient today.  Concerns regarding medicines are outlined above.  Orders Placed This Encounter  Procedures   EKG 12-Lead  Meds ordered this encounter  Medications   labetalol (NORMODYNE) 200 MG tablet    Sig: Take 2 tablets (400 mg total) by mouth 3 (three) times daily.    Dispense:  540 tablet    Refill:  3   amLODipine (NORVASC) 5 MG tablet    Sig: Take 1 tablet (5 mg total) by mouth daily.    Dispense:  90 tablet    Refill:  3    Patient Instructions  Medication Instructions:  Your physician has recommended you make the following change in your medication:  1.Increase labetalol to 400 mg by mouth 3 times a day 2.Decrease norvasc to 5 mg by mouth  daily *If you need a refill on your cardiac medications before your next appointment, please call your pharmacy*   Lab Work: None If you have labs (blood work) drawn today and your tests are completely normal, you will receive your results only by: [place] (if you have MyChart) OR A paper copy in the mail If you have any lab test that is abnormal or we need to change your treatment, we will call you to review the results.  Follow-Up: At [facility], you and your health needs are our priority.  As part of our continuing mission to provide you with exceptional heart care, we have created designated Provider Care Teams.  These Care Teams include your primary Cardiologist (physician) and Advanced Practice Providers (APPs -  Physician Assistants and Nurse Practitioners) who all work together to provide you with the care you need, when you need it.  Your next appointment:   08/02/21 at 9:15 AM  The format for your next appointment:   In Person  Provider:   Robbie Lis, PA-C     {  Important Information About Sugar         Jarrett Soho, PA  07/11/2021 2:30 PM    [place]

## 2021-07-16 ENCOUNTER — Encounter (HOSPITAL_COMMUNITY): Payer: Self-pay

## 2021-07-16 ENCOUNTER — Other Ambulatory Visit: Payer: Self-pay

## 2021-07-16 ENCOUNTER — Inpatient Hospital Stay (HOSPITAL_COMMUNITY)
Admission: EM | Admit: 2021-07-16 | Discharge: 2021-07-23 | DRG: 377 | Disposition: A | Discharge status: Home / Self Care | Payer: Medicare PPO | Attending: Internal Medicine | Admitting: Internal Medicine

## 2021-07-16 ENCOUNTER — Emergency Department (HOSPITAL_COMMUNITY): Payer: Medicare PPO

## 2021-07-16 DIAGNOSIS — E1151 Type 2 diabetes mellitus with diabetic peripheral angiopathy without gangrene: Secondary | ICD-10-CM | POA: Diagnosis present

## 2021-07-16 DIAGNOSIS — F5104 Psychophysiologic insomnia: Secondary | ICD-10-CM | POA: Diagnosis present

## 2021-07-16 DIAGNOSIS — I161 Hypertensive emergency: Secondary | ICD-10-CM | POA: Diagnosis present

## 2021-07-16 DIAGNOSIS — Z96652 Presence of left artificial knee joint: Secondary | ICD-10-CM | POA: Diagnosis present

## 2021-07-16 DIAGNOSIS — K2971 Gastritis, unspecified, with bleeding: Secondary | ICD-10-CM | POA: Diagnosis not present

## 2021-07-16 DIAGNOSIS — D631 Anemia in chronic kidney disease: Secondary | ICD-10-CM | POA: Diagnosis present

## 2021-07-16 DIAGNOSIS — M1990 Unspecified osteoarthritis, unspecified site: Secondary | ICD-10-CM | POA: Diagnosis present

## 2021-07-16 DIAGNOSIS — G4733 Obstructive sleep apnea (adult) (pediatric): Secondary | ICD-10-CM | POA: Diagnosis present

## 2021-07-16 DIAGNOSIS — Z888 Allergy status to other drugs, medicaments and biological substances status: Secondary | ICD-10-CM

## 2021-07-16 DIAGNOSIS — K921 Melena: Secondary | ICD-10-CM

## 2021-07-16 DIAGNOSIS — Z801 Family history of malignant neoplasm of trachea, bronchus and lung: Secondary | ICD-10-CM

## 2021-07-16 DIAGNOSIS — I252 Old myocardial infarction: Secondary | ICD-10-CM

## 2021-07-16 DIAGNOSIS — M109 Gout, unspecified: Secondary | ICD-10-CM | POA: Diagnosis present

## 2021-07-16 DIAGNOSIS — M898X9 Other specified disorders of bone, unspecified site: Secondary | ICD-10-CM | POA: Diagnosis present

## 2021-07-16 DIAGNOSIS — Z79899 Other long term (current) drug therapy: Secondary | ICD-10-CM

## 2021-07-16 DIAGNOSIS — N186 End stage renal disease: Secondary | ICD-10-CM | POA: Diagnosis present

## 2021-07-16 DIAGNOSIS — Z8249 Family history of ischemic heart disease and other diseases of the circulatory system: Secondary | ICD-10-CM

## 2021-07-16 DIAGNOSIS — K5900 Constipation, unspecified: Secondary | ICD-10-CM | POA: Diagnosis present

## 2021-07-16 DIAGNOSIS — D649 Anemia, unspecified: Secondary | ICD-10-CM

## 2021-07-16 DIAGNOSIS — R0989 Other specified symptoms and signs involving the circulatory and respiratory systems: Secondary | ICD-10-CM | POA: Diagnosis present

## 2021-07-16 DIAGNOSIS — Z808 Family history of malignant neoplasm of other organs or systems: Secondary | ICD-10-CM

## 2021-07-16 DIAGNOSIS — L4050 Arthropathic psoriasis, unspecified: Secondary | ICD-10-CM | POA: Diagnosis present

## 2021-07-16 DIAGNOSIS — I5032 Chronic diastolic (congestive) heart failure: Secondary | ICD-10-CM | POA: Diagnosis present

## 2021-07-16 DIAGNOSIS — M545 Low back pain, unspecified: Secondary | ICD-10-CM | POA: Diagnosis present

## 2021-07-16 DIAGNOSIS — I25110 Atherosclerotic heart disease of native coronary artery with unstable angina pectoris: Secondary | ICD-10-CM | POA: Diagnosis present

## 2021-07-16 DIAGNOSIS — M549 Dorsalgia, unspecified: Secondary | ICD-10-CM

## 2021-07-16 DIAGNOSIS — J9611 Chronic respiratory failure with hypoxia: Secondary | ICD-10-CM | POA: Diagnosis present

## 2021-07-16 DIAGNOSIS — D539 Nutritional anemia, unspecified: Secondary | ICD-10-CM | POA: Diagnosis present

## 2021-07-16 DIAGNOSIS — Z992 Dependence on renal dialysis: Secondary | ICD-10-CM

## 2021-07-16 DIAGNOSIS — Z981 Arthrodesis status: Secondary | ICD-10-CM

## 2021-07-16 DIAGNOSIS — E1122 Type 2 diabetes mellitus with diabetic chronic kidney disease: Secondary | ICD-10-CM | POA: Diagnosis present

## 2021-07-16 DIAGNOSIS — R933 Abnormal findings on diagnostic imaging of other parts of digestive tract: Secondary | ICD-10-CM | POA: Diagnosis present

## 2021-07-16 DIAGNOSIS — Z91018 Allergy to other foods: Secondary | ICD-10-CM

## 2021-07-16 DIAGNOSIS — Z825 Family history of asthma and other chronic lower respiratory diseases: Secondary | ICD-10-CM

## 2021-07-16 DIAGNOSIS — K5730 Diverticulosis of large intestine without perforation or abscess without bleeding: Secondary | ICD-10-CM | POA: Diagnosis present

## 2021-07-16 DIAGNOSIS — Z7952 Long term (current) use of systemic steroids: Secondary | ICD-10-CM

## 2021-07-16 DIAGNOSIS — T182XXA Foreign body in stomach, initial encounter: Secondary | ICD-10-CM | POA: Diagnosis present

## 2021-07-16 DIAGNOSIS — Z9981 Dependence on supplemental oxygen: Secondary | ICD-10-CM

## 2021-07-16 DIAGNOSIS — Z87891 Personal history of nicotine dependence: Secondary | ICD-10-CM

## 2021-07-16 DIAGNOSIS — G4700 Insomnia, unspecified: Secondary | ICD-10-CM | POA: Diagnosis present

## 2021-07-16 DIAGNOSIS — R12 Heartburn: Secondary | ICD-10-CM | POA: Diagnosis not present

## 2021-07-16 DIAGNOSIS — F419 Anxiety disorder, unspecified: Secondary | ICD-10-CM | POA: Diagnosis present

## 2021-07-16 DIAGNOSIS — N2581 Secondary hyperparathyroidism of renal origin: Secondary | ICD-10-CM | POA: Diagnosis present

## 2021-07-16 DIAGNOSIS — Z951 Presence of aortocoronary bypass graft: Secondary | ICD-10-CM

## 2021-07-16 DIAGNOSIS — G8929 Other chronic pain: Secondary | ICD-10-CM | POA: Diagnosis present

## 2021-07-16 DIAGNOSIS — J449 Chronic obstructive pulmonary disease, unspecified: Secondary | ICD-10-CM | POA: Diagnosis present

## 2021-07-16 DIAGNOSIS — K219 Gastro-esophageal reflux disease without esophagitis: Secondary | ICD-10-CM | POA: Diagnosis present

## 2021-07-16 DIAGNOSIS — I132 Hypertensive heart and chronic kidney disease with heart failure and with stage 5 chronic kidney disease, or end stage renal disease: Secondary | ICD-10-CM | POA: Diagnosis present

## 2021-07-16 DIAGNOSIS — K648 Other hemorrhoids: Secondary | ICD-10-CM | POA: Diagnosis present

## 2021-07-16 DIAGNOSIS — Z885 Allergy status to narcotic agent status: Secondary | ICD-10-CM

## 2021-07-16 DIAGNOSIS — Z8719 Personal history of other diseases of the digestive system: Secondary | ICD-10-CM

## 2021-07-16 DIAGNOSIS — R079 Chest pain, unspecified: Secondary | ICD-10-CM | POA: Diagnosis present

## 2021-07-16 DIAGNOSIS — K644 Residual hemorrhoidal skin tags: Secondary | ICD-10-CM | POA: Diagnosis present

## 2021-07-16 DIAGNOSIS — J302 Other seasonal allergic rhinitis: Secondary | ICD-10-CM | POA: Diagnosis present

## 2021-07-16 DIAGNOSIS — I10 Essential (primary) hypertension: Secondary | ICD-10-CM

## 2021-07-16 DIAGNOSIS — Z833 Family history of diabetes mellitus: Secondary | ICD-10-CM

## 2021-07-16 DIAGNOSIS — Z91199 Patient's noncompliance with other medical treatment and regimen due to unspecified reason: Secondary | ICD-10-CM

## 2021-07-16 DIAGNOSIS — X58XXXA Exposure to other specified factors, initial encounter: Secondary | ICD-10-CM | POA: Diagnosis present

## 2021-07-16 DIAGNOSIS — K922 Gastrointestinal hemorrhage, unspecified: Secondary | ICD-10-CM | POA: Diagnosis present

## 2021-07-16 HISTORY — DX: Chest pain, unspecified: R07.9

## 2021-07-16 LAB — BASIC METABOLIC PANEL
Anion gap: 11 (ref 5–15)
BUN: 22 mg/dL (ref 8–23)
CO2: 28 mmol/L (ref 22–32)
Calcium: 8.1 mg/dL — ABNORMAL LOW (ref 8.9–10.3)
Chloride: 100 mmol/L (ref 98–111)
Creatinine, Ser: 4.49 mg/dL — ABNORMAL HIGH (ref 0.44–1.00)
GFR, Estimated: 11 mL/min — ABNORMAL LOW (ref >60)
Glucose, Bld: 75 mg/dL (ref 70–99)
Potassium: 4 mmol/L (ref 3.5–5.1)
Sodium: 139 mmol/L (ref 135–145)

## 2021-07-16 LAB — HIV ANTIBODY (ROUTINE TESTING W REFLEX): HIV Screen 4th Generation wRfx: NONREACTIVE

## 2021-07-16 LAB — FOLATE: Folate: 36.4 ng/mL (ref >5.9)

## 2021-07-16 LAB — HEPATITIS B CORE ANTIBODY, TOTAL: Hep B Core Total Ab: NONREACTIVE

## 2021-07-16 LAB — CBC
HCT: 25.7 % — ABNORMAL LOW (ref 36.0–46.0)
Hemoglobin: 7.6 g/dL — ABNORMAL LOW (ref 12.0–15.0)
MCH: 31.9 pg (ref 26.0–34.0)
MCHC: 29.6 g/dL — ABNORMAL LOW (ref 30.0–36.0)
MCV: 108 fL — ABNORMAL HIGH (ref 80.0–100.0)
Platelets: 152 10*3/uL (ref 150–400)
RBC: 2.38 MIL/uL — ABNORMAL LOW (ref 3.87–5.11)
RDW: 17.4 % — ABNORMAL HIGH (ref 11.5–15.5)
WBC: 4.8 10*3/uL (ref 4.0–10.5)
nRBC: 0 % (ref 0.0–0.2)

## 2021-07-16 LAB — HEPATITIS B SURFACE ANTIGEN: Hepatitis B Surface Ag: NONREACTIVE

## 2021-07-16 LAB — TROPONIN I (HIGH SENSITIVITY)
Troponin I (High Sensitivity): 15 ng/L (ref <18)
Troponin I (High Sensitivity): 17 ng/L (ref <18)

## 2021-07-16 LAB — MAGNESIUM: Magnesium: 1.7 mg/dL (ref 1.7–2.4)

## 2021-07-16 LAB — HEPATITIS C ANTIBODY: HCV Ab: NONREACTIVE

## 2021-07-16 LAB — IRON AND TIBC
Iron: 54 ug/dL (ref 28–170)
Saturation Ratios: 30 % (ref 10.4–31.8)
TIBC: 182 ug/dL — ABNORMAL LOW (ref 250–450)
UIBC: 128 ug/dL

## 2021-07-16 LAB — LIPID PANEL
Cholesterol: 131 mg/dL (ref 0–200)
HDL: 55 mg/dL (ref >40)
LDL Cholesterol: 69 mg/dL (ref 0–99)
Total CHOL/HDL Ratio: 2.4 RATIO
Triglycerides: 34 mg/dL (ref <150)
VLDL: 7 mg/dL (ref 0–40)

## 2021-07-16 LAB — CBG MONITORING, ED: Glucose-Capillary: 73 mg/dL (ref 70–99)

## 2021-07-16 LAB — RETICULOCYTES
Immature Retic Fract: 15.5 % (ref 2.3–15.9)
RBC.: 2.3 MIL/uL — ABNORMAL LOW (ref 3.87–5.11)
Retic Count, Absolute: 63.7 10*3/uL (ref 19.0–186.0)
Retic Ct Pct: 2.8 % (ref 0.4–3.1)

## 2021-07-16 LAB — VITAMIN B12: Vitamin B-12: 512 pg/mL (ref 180–914)

## 2021-07-16 LAB — HEPATITIS B SURFACE ANTIBODY,QUALITATIVE: Hep B S Ab: REACTIVE — AB

## 2021-07-16 MED ORDER — UMECLIDINIUM BROMIDE 62.5 MCG/ACT IN AEPB
1.0000 | INHALATION_SPRAY | Freq: Every day | RESPIRATORY_TRACT | Status: DC
Start: 2021-07-16 — End: 2021-07-17
  Administered 2021-07-17: 1 via RESPIRATORY_TRACT
  Filled 2021-07-16: qty 7

## 2021-07-16 MED ORDER — HYDRALAZINE HCL 25 MG PO TABS
100.0000 mg | ORAL_TABLET | Freq: Three times a day (TID) | ORAL | Status: DC
  Administered 2021-07-16 – 2021-07-23 (×21): 100 mg via ORAL
  Filled 2021-07-16 (×21): qty 4

## 2021-07-16 MED ORDER — CHLORHEXIDINE GLUCONATE CLOTH 2 % EX PADS
6.0000 | MEDICATED_PAD | Freq: Every day | CUTANEOUS | Status: DC
  Administered 2021-07-17 – 2021-07-18 (×2): 6 via TOPICAL

## 2021-07-16 MED ORDER — ZOLPIDEM TARTRATE 5 MG PO TABS: 5.0000 mg | ORAL_TABLET | Freq: Every evening | ORAL | Status: DC | PRN

## 2021-07-16 MED ORDER — CLONIDINE HCL 0.1 MG PO TABS
0.1000 mg | ORAL_TABLET | Freq: Three times a day (TID) | ORAL | Status: DC
  Administered 2021-07-16 – 2021-07-23 (×20): 0.1 mg via ORAL
  Filled 2021-07-16 (×20): qty 1

## 2021-07-16 MED ORDER — MONTELUKAST SODIUM 10 MG PO TABS
10.0000 mg | ORAL_TABLET | Freq: Every day | ORAL | Status: DC
  Administered 2021-07-16 – 2021-07-23 (×8): 10 mg via ORAL
  Filled 2021-07-16 (×8): qty 1

## 2021-07-16 MED ORDER — ALLOPURINOL 100 MG PO TABS
100.0000 mg | ORAL_TABLET | Freq: Every evening | ORAL | Status: DC
  Administered 2021-07-16 – 2021-07-22 (×7): 100 mg via ORAL
  Filled 2021-07-16 (×7): qty 1

## 2021-07-16 MED ORDER — ENOXAPARIN SODIUM 30 MG/0.3ML IJ SOSY: 30.0000 mg | PREFILLED_SYRINGE | INTRAMUSCULAR | Status: DC

## 2021-07-16 MED ORDER — ACETAMINOPHEN 650 MG RE SUPP: 650.0000 mg | Freq: Four times a day (QID) | RECTAL | Status: DC | PRN

## 2021-07-16 MED ORDER — LABETALOL HCL 200 MG PO TABS
400.0000 mg | ORAL_TABLET | Freq: Three times a day (TID) | ORAL | Status: DC
  Administered 2021-07-16 – 2021-07-23 (×19): 400 mg via ORAL
  Filled 2021-07-16 (×20): qty 2

## 2021-07-16 MED ORDER — HEPARIN SODIUM (PORCINE) 5000 UNIT/ML IJ SOLN
5000.0000 [IU] | Freq: Three times a day (TID) | INTRAMUSCULAR | Status: DC
  Administered 2021-07-16 – 2021-07-17 (×3): 5000 [IU] via SUBCUTANEOUS
  Filled 2021-07-16 (×3): qty 1

## 2021-07-16 MED ORDER — LORAZEPAM 1 MG PO TABS
1.0000 mg | ORAL_TABLET | Freq: Three times a day (TID) | ORAL | Status: DC | PRN
  Administered 2021-07-17 (×2): 1 mg via ORAL
  Filled 2021-07-16 (×2): qty 1

## 2021-07-16 MED ORDER — ALBUTEROL SULFATE (2.5 MG/3ML) 0.083% IN NEBU: 2.5000 mg | INHALATION_SOLUTION | Freq: Four times a day (QID) | RESPIRATORY_TRACT | Status: DC | PRN

## 2021-07-16 MED ORDER — ACETAMINOPHEN 325 MG PO TABS
650.0000 mg | ORAL_TABLET | Freq: Four times a day (QID) | ORAL | Status: DC | PRN
  Administered 2021-07-16 – 2021-07-22 (×12): 650 mg via ORAL
  Filled 2021-07-16 (×12): qty 2

## 2021-07-16 MED ORDER — AMLODIPINE BESYLATE 5 MG PO TABS
5.0000 mg | ORAL_TABLET | Freq: Every day | ORAL | Status: DC
  Administered 2021-07-17: 5 mg via ORAL
  Filled 2021-07-16: qty 1

## 2021-07-16 MED ORDER — NITROGLYCERIN IN D5W 200-5 MCG/ML-% IV SOLN
0.0000 ug/min | INTRAVENOUS | Status: DC
  Administered 2021-07-16: 5 ug/min via INTRAVENOUS
  Filled 2021-07-16: qty 250

## 2021-07-16 MED ORDER — ISOSORBIDE MONONITRATE ER 60 MG PO TB24
60.0000 mg | ORAL_TABLET | Freq: Every day | ORAL | Status: DC
  Administered 2021-07-16 – 2021-07-23 (×8): 60 mg via ORAL
  Filled 2021-07-16: qty 1
  Filled 2021-07-16: qty 2
  Filled 2021-07-16 (×6): qty 1

## 2021-07-16 MED ORDER — NITROGLYCERIN 0.4 MG SL SUBL
0.4000 mg | SUBLINGUAL_TABLET | SUBLINGUAL | Status: DC | PRN
  Administered 2021-07-16: 0.4 mg via SUBLINGUAL
  Filled 2021-07-16: qty 1

## 2021-07-16 MED ORDER — POLYETHYLENE GLYCOL 3350 17 G PO PACK: 17.0000 g | PACK | Freq: Every day | ORAL | Status: DC | PRN

## 2021-07-16 MED ORDER — PANTOPRAZOLE SODIUM 40 MG PO TBEC
40.0000 mg | DELAYED_RELEASE_TABLET | Freq: Every day | ORAL | Status: DC
  Administered 2021-07-16 – 2021-07-23 (×8): 40 mg via ORAL
  Filled 2021-07-16 (×8): qty 1

## 2021-07-16 MED ORDER — PREDNISONE 5 MG PO TABS
5.0000 mg | ORAL_TABLET | Freq: Every day | ORAL | Status: DC
  Administered 2021-07-17 – 2021-07-23 (×6): 5 mg via ORAL
  Filled 2021-07-16 (×7): qty 1

## 2021-07-16 MED ORDER — ISOSORBIDE MONONITRATE ER 30 MG PO TB24: 30.0000 mg | ORAL_TABLET | Freq: Every day | ORAL | Status: DC

## 2021-07-16 NOTE — Consult Note (Signed)
[facility] Renal Consultation Note  Indication for Consultation:  Management of ESRD/hemodialysis; anemia, hypertension/volume and secondary hyperparathyroidism  HPI: Jodi Bell is a 61 y.o. female ESRD on hemodialysis at [facility] [place] MWF, 1st HD 2019, history of DMT2 , HTN, CAD status post CABG, COPD/asthma on 3 L O2 at baseline, diastolic heart failure PAD, gout, GERD, recent admit 5/17- 20/2023 volume overload adenovirus infection, COPD aspiration , HTN. Presents to the ER short of breath/cough O2 sat 100% on 2 L nasal cannula oxygen afebrile, hypertensive 218/112 initially, chest pain ,troponin initially 15 and down trended Hgb 7.6 CXR plus congestion without focal consolidation or pleural effusion. Patient attended her last HD treatment yesterday, said she had HD treatment Saturday 2/2 vol ^.  Noted her EDW was never lowered after her May admission with a viral respiratory illness.  She thinks she may have lost some body weight.  Denies any fevers nausea vomiting, may have missed her a.m. blood pressure meds because of significant coughing.        Past Medical History:  Diagnosis Date   Anemia of chronic disease    Anxiety    Arthritis    oa and psoriatic ra   Asthma    Back pain, chronic    lower back   Candida infection finished tx 2 days ago   Chronic insomnia    COPD (chronic obstructive pulmonary disease) (HCC)    Coronary artery calcification seen on CT scan    Diabetes mellitus    type 2   Eczema    ESRD on dialysis [facility])    Essential hypertension    GERD (gastroesophageal reflux disease)    Gout    History of hiatal hernia    Hypoalbuminemia    Hyponatremia    Mild aortic stenosis    Mitral regurgitation    Mitral stenosis    Obesity    PAD (peripheral artery disease) ([place])    a. externial iliac calcification seen on CT 04/2020.   Pneumonia few yrs ago x 2   Sleep apnea    Tobacco abuse    Upper GI bleed 05/2019    Past  Surgical History:  Procedure Laterality Date   BACK SURGERY  2016   lower back fusion with cage   BIOPSY  09/23/2017   Procedure: BIOPSY;  Surgeon: Wonda Horner, MD;  Location: [facility];  Service: Endoscopy;;   BIOPSY  05/19/2019   Procedure: BIOPSY;  Surgeon: Otis Brace, MD;  Location: [place];  Service: Gastroenterology;;   CESAREAN SECTION  1990   x 1    COLONOSCOPY WITH PROPOFOL N/A 09/23/2017   Procedure: COLONOSCOPY WITH PROPOFOL Hemostatic clips placed;  Surgeon: Wonda Horner, MD;  Location: [facility];  Service: Endoscopy;  Laterality: N/A;   COLONOSCOPY WITH PROPOFOL N/A 10/02/2020   Procedure: COLONOSCOPY WITH PROPOFOL;  Surgeon: Arta Silence, MD;  Location: [place];  Service: Endoscopy;  Laterality: N/A;   CORONARY ARTERY BYPASS GRAFT N/A 09/13/2020   Procedure: CORONARY ARTERY BYPASS GRAFTING (CABG), ON PUMP, TIMES TWO, USING LEFT INTERNAL MAMMARY ARTERY AND RIGHT ENDOSCOPICALLY HARVESTED GREATER SAPHENOUS VEIN;  Surgeon: Gaye Pollack, MD;  Location: [place];  Service: Open Heart Surgery;  Laterality: N/A;   DILATION AND CURETTAGE OF UTERUS  1988   ESOPHAGOGASTRODUODENOSCOPY N/A 09/22/2020   Procedure: ESOPHAGOGASTRODUODENOSCOPY (EGD);  Surgeon: Arta Silence, MD;  Location: [facility] ENDOSCOPY;  Service: Endoscopy;  Laterality: N/A;   ESOPHAGOGASTRODUODENOSCOPY (EGD) WITH PROPOFOL N/A 09/23/2017  Procedure: ESOPHAGOGASTRODUODENOSCOPY (EGD) WITH PROPOFOL;  Surgeon: Wonda Horner, MD;  Location: [facility];  Service: Endoscopy;  Laterality: N/A;   ESOPHAGOGASTRODUODENOSCOPY (EGD) WITH PROPOFOL N/A 05/19/2019   Procedure: ESOPHAGOGASTRODUODENOSCOPY (EGD) WITH PROPOFOL;  Surgeon: Otis Brace, MD;  Location: [place];  Service: Gastroenterology;  Laterality: N/A;   ESOPHAGOGASTRODUODENOSCOPY (EGD) WITH PROPOFOL N/A 10/02/2020   Procedure: ESOPHAGOGASTRODUODENOSCOPY (EGD) WITH PROPOFOL;  Surgeon: Arta Silence, MD;  Location: [place];  Service:  Endoscopy;  Laterality: N/A;   GIVENS CAPSULE STUDY N/A 05/19/2019   Procedure: GIVENS CAPSULE STUDY;  Surgeon: Otis Brace, MD;  Location: [place];  Service: Gastroenterology;  Laterality: N/A;   HEMOSTASIS CLIP PLACEMENT  09/22/2020   Procedure: HEMOSTASIS CLIP PLACEMENT;  Surgeon: Arta Silence, MD;  Location: [place];  Service: Endoscopy;;   HEMOSTASIS CONTROL  09/22/2020   Procedure: HEMOSTASIS CONTROL;  Surgeon: Arta Silence, MD;  Location: [place];  Service: Endoscopy;;   HOT HEMOSTASIS N/A 05/19/2019   Procedure: HOT HEMOSTASIS (ARGON PLASMA COAGULATION/BICAP);  Surgeon: Otis Brace, MD;  Location: [facility] ENDOSCOPY;  Service: Gastroenterology;  Laterality: N/A;   LEFT HEART CATH AND CORONARY ANGIOGRAPHY N/A 09/10/2020   Procedure: LEFT HEART CATH AND CORONARY ANGIOGRAPHY;  Surgeon: Nelva Bush, MD;  Location: [place] CV LAB;  Service: Cardiovascular;  Laterality: N/A;   PARTIAL KNEE ARTHROPLASTY Left 11/12/2017   Procedure: left unicompartmental arthroplasty-medial;  Surgeon: Paralee Cancel, MD;  Location: [facility];  Service: Orthopedics;  Laterality: Left;  11min   SUBMUCOSAL INJECTION  09/23/2017   Procedure: SUBMUCOSAL INJECTION;  Surgeon: Wonda Horner, MD;  Location: [facility];  Service: Endoscopy;;  in colon   TEE WITHOUT CARDIOVERSION N/A 09/13/2020   Procedure: TRANSESOPHAGEAL ECHOCARDIOGRAM (TEE);  Surgeon: Gaye Pollack, MD;  Location: [place];  Service: Open Heart Surgery;  Laterality: N/A;   TUBAL LIGATION  1990      Family History  Problem Relation Age of Onset   Lung cancer Mother    Diabetes Sister    Heart disease Sister    Asthma Daughter    Arthritis Daughter    Arthritis Daughter    Thyroid cancer Other        1/2 sister   Heart disease Other        1/2 sister      reports that she quit smoking about 11 months ago. Her smoking use included cigarettes. She has a 20.00 pack-year smoking history. She has never used smokeless  tobacco. She reports current alcohol use. She reports that she does not use drugs.   Allergies  Allergen Reactions   3-Methyl-2-Benzothiazolinone Hydrazone Other (See Comments)    Muscle weakness muscle cramping in legs Other reaction(s): Myalgias (Muscle Pain)   Banana Anaphylaxis, Swelling and Other (See Comments)    TONGUE AND MOUTH SWELLING   Black Walnut Flavor Anaphylaxis and Itching    Walnuts   Hazelnut ([place]) Allergy Skin Test Anaphylaxis    Hazelnuts   Leflunomide And Related Other (See Comments)    Severe Headache   Lisinopril Swelling    Angioedema  Swelling of the face   No [employer] Food Allergies Anaphylaxis    Spicy Mustard 4.7.2021 Pt reports that she eats Regular Yellow Mustard Swelling and itching of tongue   Other Other (See Comments), Anaphylaxis and Swelling    Cosentyx= angioedema  Other reaction(s): Facial Edema (intolerance) Mouth itches and tongue swelling Hazel nuts and pecans also Walnuts Walnuts Renal failure Other reaction(s): Other Serotonin Syndrome  Serotonin syndrome  Tremors Other  reaction(s): Other Tremors Muscle weakness muscle cramping in legs Other reaction(s): Myalgias (Muscle Pain) Mouth itches and tongue swelling Hazel nuts and pecans also Walnuts    Pecan Extract Allergy Skin Test Anaphylaxis and Itching    Pecans    Pecan Nut (Diagnostic) Anaphylaxis and Itching    Pecans   Trazodone And Nefazodone Other (See Comments)    Serotonin Syndrome  Tremors   Adalimumab Other (See Comments)    Blisters on abdomen =humira   Escitalopram Oxalate Other (See Comments)    Hand problems - Serotonin Syndrome     Ezetimibe Other (See Comments)    myalgias cramps    Secukinumab Swelling    Cosentyx = angiodema    Statins Other (See Comments)    Leg pains and weakness   Ferrlecit [Na Ferric Gluc Cplx In Sucrose]     Not reaction given from HD   Gabapentin Other (See Comments)    tremors   Ibuprofen Other (See  Comments)    Renal Problems    Prior to Admission medications   Medication Sig Start Date End Date Taking? Authorizing Provider  acetaminophen (TYLENOL) 500 MG tablet Take 1,000 mg by mouth daily as needed (pain).    [provider]  albuterol (PROVENTIL) (2.5 MG/3ML) 0.083% nebulizer solution Take 3 mLs (2.5 mg total) by nebulization every 6 (six) hours as needed for wheezing or shortness of breath. 07/10/21   Hunsucker, Bonna Gains, MD  allopurinol (ZYLOPRIM) 100 MG tablet Take 100 mg by mouth every evening.    [provider]  amLODipine (NORVASC) 5 MG tablet Take 1 tablet (5 mg total) by mouth daily. 07/11/21   Bhagat, Crista Luria, PA  B Complex-C-Folic Acid (RENAL-VITE PO) Take 1 tablet by mouth daily.    [provider]  calcium acetate (PHOSLO) 667 MG capsule Take 1,334 mg by mouth See admin instructions. Take 1,334 by mouth twice a day when able to remember. 05/13/21   [provider]  carboxymethylcellulose 1 % ophthalmic solution Place 2 drops into both eyes 2 (two) times daily as needed (dry eyes).    [provider]  cetirizine (ZYRTEC) 10 MG tablet Take 10 mg by mouth daily as needed for allergies.    [provider]  cholecalciferol (VITAMIN D) 25 MCG tablet Take 1 tablet (1,000 Units total) by mouth every evening. 10/04/20   Gold, Wilder Glade, PA-C  cholecalciferol (VITAMIN D3) 10 MCG (400 UNIT) TABS tablet Take 800 Units by mouth daily.    [provider]  cinacalcet (SENSIPAR) 30 MG tablet Take 30 mg by mouth daily.    [provider]  cloNIDine (CATAPRES) 0.1 MG tablet Take 0.1 mg by mouth 3 (three) times daily.    [provider]  cyclobenzaprine (FLEXERIL) 10 MG tablet Take 10 mg by mouth 2 (two) times daily as needed for muscle spasms.    [provider]  Darbepoetin Alfa (ARANESP) 200 MCG/0.4ML SOSY injection Inject 0.4 mLs (200 mcg total) into the vein every Wednesday with hemodialysis. 10/10/20    Gold, Patrick Jupiter E, PA-C  Diclofenac Sodium (VOLTAREN EX) Apply 1 application. topically daily as needed (pain).    [provider]  docusate sodium (COLACE) 100 MG capsule Take 100-200 mg by mouth daily as needed for mild constipation.    [provider]  ferrous sulfate 325 (65 FE) MG tablet Take 325 mg by mouth See admin instructions. Take 325 mg by mouth three times a week    [provider]  folic acid (FOLVITE) 671 MCG tablet Take 400 mcg by mouth daily.    [provider]  gabapentin (NEURONTIN) 300 MG capsule Take 1 capsule (300 mg total) by mouth at bedtime as needed (Neuropathic pain). Patient taking differently: Take 300 mg by mouth daily as needed (Neuropathic pain). 12/14/17 06/26/21  Dana Allan I, MD  heparin 1000 unit/mL SOLN injection 3 mLs (3,000 Units total) by Dialysis route as needed (in dialysis as needed). 10/04/20   Jadene Pierini E, PA-C  heparin 1000 unit/mL SOLN injection 2 mLs (2,000 Units total) by Dialysis route as needed (in dialysis as needed). 10/04/20   Jadene Pierini E, PA-C  heparin 1000 unit/mL SOLN injection 1 mL (1,000 Units total) by Dialysis route as needed (in dialysis). 10/04/20   Jadene Pierini E, PA-C  hydrALAZINE (APRESOLINE) 100 MG tablet Take 1 tablet (100 mg total) by mouth every 8 (eight) hours. Patient taking differently: Take 100 mg by mouth 3 (three) times daily. 07/06/20   [name], MD  hydrocortisone (ANUSOL-HC) 25 MG suppository Place 25 mg rectally daily as needed for hemorrhoids.    [provider]  Ibuprofen-Acetaminophen (ADVIL DUAL ACTION PO) Take 3 tablets by mouth 2 (two) times daily as needed (pain).    [provider]  isosorbide mononitrate (IMDUR) 30 MG 24 hr tablet Take 1 tablet (30 mg total) by mouth daily. 02/20/21   Jerline Pain, MD  labetalol (NORMODYNE) 200 MG tablet Take 2 tablets (400 mg total) by mouth 3 (three) times daily. 07/11/21   Bhagat, Crista Luria, PA   lidocaine-prilocaine (EMLA) cream Apply 1 application. topically 2 (two) times daily as needed (pain in fingers).    [provider]  LORazepam (ATIVAN) 1 MG tablet Take 1 mg by mouth every 8 (eight) hours as needed for anxiety. 06/25/20   [provider]  montelukast (SINGULAIR) 10 MG tablet Take 10 mg by mouth daily with supper.     [provider]  multivitamin (RENA-VIT) TABS tablet Take 1 tablet by mouth daily with supper.    [provider]  olmesartan (BENICAR) 40 MG tablet Take 40 mg by mouth every evening. 05/06/19   [provider]  ondansetron (ZOFRAN) 4 MG tablet Take 4 mg by mouth 2 (two) times daily as needed for nausea or vomiting.    [provider]  OVER THE COUNTER MEDICATION Take by mouth daily as needed (sickness). Elderberry Fruit and flower 1 dropper full by mouth daily as needed for sickness    [provider]  Oxycodone HCl 10 MG TABS Take 10 mg by mouth daily as needed for pain. 01/11/21   [provider]  OXYGEN Inhale 2 L into the lungs as directed.    [provider]  pantoprazole (PROTONIX) 40 MG tablet Take 40 mg by mouth daily. 06/09/20   [provider]  polyethylene glycol powder (GLYCOLAX/MIRALAX) 17 GM/SCOOP powder Mix 17 grams into 8 ounces of liquid and drink by mouth daily. Patient taking differently: Take 17 g by mouth daily as needed (constipation). 03/19/21   Carollee Leitz, MD  predniSONE (DELTASONE) 5 MG tablet Take 4 tablets (20 mg total) by mouth daily with breakfast for 2 days, THEN 1 tablet (5 mg total) daily with breakfast. 06/29/21 12/28/21  Lacinda Axon, MD  Probiotic Product (PROBIOTIC PO) Take 1 capsule by mouth daily.    [provider]  sorbitol 70 % solution Take 30 mLs by mouth See admin instructions. Take 30 ml one- two  times daily as needed for constipation 07/10/21   Hunsucker, Bonna Gains, MD  Tiotropium Bromide Monohydrate (SPIRIVA RESPIMAT) 2.5  MCG/ACT AERS Inhale 2 puffs into the lungs 2 (two) times daily as needed (wheezing/SOB). 07/10/21   Hunsucker, Bonna Gains, MD  VENTOLIN HFA 108 (90 Base) MCG/ACT inhaler Inhale 2 puffs into the lungs See admin instructions. Take 2 puffs every 4-6 hours as needed for wheezing and shortness of breath 07/10/21   Hunsucker, Bonna Gains, MD  zolpidem (AMBIEN) 5 MG tablet Take 5 mg by mouth at bedtime as needed for sleep. 02/04/21   [provider]      .  ROS: See HPI   Physical Exam: Vitals:   07/16/21 1415 07/16/21 1430  BP: (!) 199/82 (!) 198/85  Pulse: 73 75  Resp: 16 20  Temp:    SpO2: 100% 100%     General:  adult female chronically ill-appearing but not in distress 2 L nasal cannula oxygen HEENT: [place], nonicteric PERRLA, MMM Neck: JVD Heart: RRR no MRG Lungs: Bibasilar faint rhonchi nonlabored 2 L nasal cannula oxygen Abdomen: NABS, soft NTND Extremities: No pedal edema Skin: No overt rash or ulcers noted Neuro: Alert O x3 no acute focal deficits appreciated no asterixis Dialysis Access: Left upper arm AV fistula slightly aneurysmal but no ascending positive bruit  Dialysis Orders: Center: MWF [facility] [place] 3.5 hours No heparin, EDW 60 kg Epogen 13,000 units q. Dialysis Hectorol 4 mics    Assessment/Plan Shortness of breath multifactorial= volume overload/ COPD component, plan to dialyze today UF as tolerated ESRD -HD usually Monday Wednesday Friday, HD tonight for volume follow-up tomorrow K 4.0, BUN 22 CR 4.49 CO2 28 Hypertension/volume  -hypertensive related to volume versus missing meds continue home meds amlodipine 10 mg daily, clonidine 0.1 mg 3 times daily, hydralazine 100 mg 3 times daily olmesartan 40 mg daily.  May be able to taper down as UF with HD Chest pain= being evaluated by admit team and noted cardiology is being consulted Anemia of ESRD continue Epogen 03,546 units Metabolic bone disease -continue Hectorol on dialysis and phosphate binders  follow-up lab trend Nutrition -renal carb modified with fluid restrictions DM type II poorly diet control plan for admit  Ernest Haber, PA-C [place] [phone] 07/16/2021, 2:47 PM

## 2021-07-16 NOTE — Consult Note (Signed)
Cardiology Consultation:   Patient ID: Jodi Bell MRN: [id number]; DOB: 03/07/1960  Admit date: 07/16/2021 Date of Consult: 07/16/2021  PCP:  [name]., MD   [facility] Providers Cardiologist:  Candee Furbish, MD     Patient Profile:   Jodi Bell is a 61 y.o. female with a hx of COPD/chronic hypoxic respiratory failure on 2 to 3 L, CAD status post CABG x2 '22, hypertension, ESRD on HD, OSA (noncompliant with CPAP), paroxysmal atrial fibrillation postoperatively, diabetes who is being seen 07/16/2021 for the evaluation of chest pain at the request of Dr. Jeanell Sparrow.  History of Present Illness:   Jodi Bell is a 61 year old female with past medical history noted above.  He has been followed by Dr. Marlou Porch as an outpatient.  She was first evaluated by heart care 06/2020.  She was previously followed by Dr. Junius Finner for several years.  She was seen at [facility] in 2022 with anticipation of renal transplant notes there indicates she had issues with borderline elevated blood pressures and was also having issues with hypotension at dialysis.  The transplant testing included echocardiogram 04/2020 showing normal LV function, mild LVH, trivial AI/MR, mild to moderate aortic stenosis, mild TR, trivial pericardial effusion.  CT showed adrenal tumors demonstrate mild to moderate calcification and tortuosity of external iliac arteries as well as severe coronary artery calcification.  Cardiac PET CT demonstrated normal LV function, no evidence of ischemia abnormal myocardial blood flow reserve suggesting balanced ischemia or microvascular disease.  Additional CT imaging showed numerous bilateral renal lesions likely representing cyst.  She presented to [facility] 08/2020 with complaints of vague left-sided chest pain.  High-sensitivity troponin levels elevated to 1499.  Underwent cardiac catheterization that showed 80% stenosis of proximal to mid LAD as well as 90% stenosis of mid RCA.  She was referred to CT  surgery and underwent two-vessel CABG with Dr. Cyndia Bent.  Postop course was complicated by acute mental status changes and seizure-like activity as well as GI bleeding with hemoglobin dropped to 8.3.  Did undergo endoscopy which revealed nonbleeding gastric ulcer with visible vessel that was clipped.  Did have postop atrial fibrillation and started on amiodarone.  Seen by Dr. Marlou Porch in follow-up and her amiodarone was discontinued.  She has had multiple admissions for COPD exacerbations and hypoxia.  Last admission was 06/2021 for COPD and volume overload.  Echo showed LVEF of 70 to 75%, severe LVH, grade 2 diastolic dysfunction, elevated LVEDP, normal RV and PASP, really dilated left atrium, mild MR, moderate to severe aortic stenosis with mean gradient of 41 mmHg.  She was last seen in the office on 07/11/2021 with intermittent episodes of sharp achy chest pain.  She had increased her Imdur to 60 mg for 2 days prior to her office visit with improvement in symptoms.  Also reported some palpitations.  Her recent echocardiogram was reviewed by Dr. Marlou Porch and it was felt her aortic stenosis was likely in the mild to moderate range.  No indication for further aortic work-up was recommended.  Her beta-blocker was increased to help better control her heart rate.  In regards to her reports of chest pain her symptoms had resolved with increasing her Imdur, but she was back down to 30 mg daily.  Recommended to obtain stress test if recurrent symptoms.  Presented to the ED on 6/6 with complaints of chest pain and significantly elevated blood pressures.   In the ED she was found to be significantly hypertensive with blood  pressures as high as 218/112.  Sodium 139, potassium 4, creatinine 4.49, high-sensitivity troponin 15>>, WBC 4.8, hemoglobin 7.6.  Chest x-ray with pulmonary vascular congestion without evidence of pleural effusions.  EKG showed sinus rhythm, 74 bpm, atrial enlargement but no acute ST/T wave  abnormalities.  She was started on IV nitroglycerin with some improvement in her blood pressures.  Admitted to internal medicine for further management.  Cardiology asked to evaluate in regards to her chest pain.  Nephrology has evaluated the patient for her HD needs.  In talking with the patient she reports she did go to dialysis yesterday and sat for the full session.  States her blood pressure was elevated after leaving dialysis.  This morning she woke up and just did not "feel well".  Called her PT session and informed them that she would not be coming today.  States she was walking to put away some close and developed centralized chest pressure with a squeezing type sensation.  States the symptoms are more significant than what she was experiencing when at the office the week prior.  Ultimately called EMS.  Past Medical History:  Diagnosis Date   Anemia of chronic disease    Anxiety    Arthritis    oa and psoriatic ra   Asthma    Back pain, chronic    lower back   Candida infection finished tx 2 days ago   Chronic insomnia    COPD (chronic obstructive pulmonary disease) (HCC)    Coronary artery calcification seen on CT scan    Diabetes mellitus    type 2   Eczema    ESRD on dialysis [facility])    Essential hypertension    GERD (gastroesophageal reflux disease)    Gout    History of hiatal hernia    Hypoalbuminemia    Hyponatremia    Mild aortic stenosis    Mitral regurgitation    Mitral stenosis    Obesity    PAD (peripheral artery disease) ([place])    a. externial iliac calcification seen on CT 04/2020.   Pneumonia few yrs ago x 2   Sleep apnea    Tobacco abuse    Upper GI bleed 05/2019    Past Surgical History:  Procedure Laterality Date   BACK SURGERY  2016   lower back fusion with cage   BIOPSY  09/23/2017   Procedure: BIOPSY;  Surgeon: Wonda Horner, MD;  Location: [facility];  Service: Endoscopy;;   BIOPSY  05/19/2019   Procedure: BIOPSY;  Surgeon: Otis Brace,  MD;  Location: [place];  Service: Gastroenterology;;   CESAREAN SECTION  1990   x 1    COLONOSCOPY WITH PROPOFOL N/A 09/23/2017   Procedure: COLONOSCOPY WITH PROPOFOL Hemostatic clips placed;  Surgeon: Wonda Horner, MD;  Location: [facility];  Service: Endoscopy;  Laterality: N/A;   COLONOSCOPY WITH PROPOFOL N/A 10/02/2020   Procedure: COLONOSCOPY WITH PROPOFOL;  Surgeon: Arta Silence, MD;  Location: [place];  Service: Endoscopy;  Laterality: N/A;   CORONARY ARTERY BYPASS GRAFT N/A 09/13/2020   Procedure: CORONARY ARTERY BYPASS GRAFTING (CABG), ON PUMP, TIMES TWO, USING LEFT INTERNAL MAMMARY ARTERY AND RIGHT ENDOSCOPICALLY HARVESTED GREATER SAPHENOUS VEIN;  Surgeon: Gaye Pollack, MD;  Location: [place];  Service: Open Heart Surgery;  Laterality: N/A;   DILATION AND CURETTAGE OF UTERUS  1988   ESOPHAGOGASTRODUODENOSCOPY N/A 09/22/2020   Procedure: ESOPHAGOGASTRODUODENOSCOPY (EGD);  Surgeon: Arta Silence, MD;  Location: [facility] END[facility]COPY;  Service: Endoscopy;  Laterality: N/A;  ESOPHAGOGASTRODUODENOSCOPY (EGD) WITH PROPOFOL N/A 09/23/2017   Procedure: ESOPHAGOGASTRODUODENOSCOPY (EGD) WITH PROPOFOL;  Surgeon: Wonda Horner, MD;  [facility]: [facility];  Service: Endoscopy;  Laterality: N/A;   ESOPHAGOGASTRODUODENOSCOPY (EGD) WITH PROPOFOL N/A 05/19/2019   Procedure: ESOPHAGOGASTRODUODENOSCOPY (EGD) WITH PROPOFOL;  Surgeon: Otis Brace, MD;  Location: [place];  Service: Gastroenterology;  Laterality: N/A;   ESOPHAGOGASTRODUODENOSCOPY (EGD) WITH PROPOFOL N/A 10/02/2020   Procedure: ESOPHAGOGASTRODUODENOSCOPY (EGD) WITH PROPOFOL;  Surgeon: Arta Silence, MD;  Location: [place];  Service: Endoscopy;  Laterality: N/A;   GIVENS CAPSULE STUDY N/A 05/19/2019   Procedure: GIVENS CAPSULE STUDY;  Surgeon: Otis Brace, MD;  Location: [place];  Service: Gastroenterology;  Laterality: N/A;   HEMOSTASIS CLIP PLACEMENT  09/22/2020   Procedure: HEMOSTASIS CLIP PLACEMENT;  Surgeon:  Arta Silence, MD;  Location: [place];  Service: Endoscopy;;   HEMOSTASIS CONTROL  09/22/2020   Procedure: HEMOSTASIS CONTROL;  Surgeon: Arta Silence, MD;  Location: [place];  Service: Endoscopy;;   HOT HEMOSTASIS N/A 05/19/2019   Procedure: HOT HEMOSTASIS (ARGON PLASMA COAGULATION/BICAP);  Surgeon: Otis Brace, MD;  Location: [facility];  Service: Gastroenterology;  Laterality: N/A;   LEFT HEART CATH AND CORONARY ANGIOGRAPHY N/A 09/10/2020   Procedure: LEFT HEART CATH AND CORONARY ANGIOGRAPHY;  Surgeon: Nelva Bush, MD;  Location: [place] CV [facility];  Service: Cardiovascular;  Laterality: N/A;   PARTIAL KNEE ARTHROPLASTY Left 11/12/2017   Procedure: left unicompartmental arthroplasty-medial;  Surgeon: Paralee Cancel, MD;  Location: [facility];  Service: Orthopedics;  Laterality: Left;  57min   SUBMUCOSAL INJECTION  09/23/2017   Procedure: SUBMUCOSAL INJECTION;  Surgeon: Wonda Horner, MD;  Location: [facility];  Service: Endoscopy;;  in colon   TEE WITHOUT CARDIOVERSION N/A 09/13/2020   Procedure: TRANSESOPHAGEAL ECHOCARDIOGRAM (TEE);  Surgeon: Gaye Pollack, MD;  Location: [place];  Service: Open Heart Surgery;  Laterality: N/A;   TUBAL LIGATION  1990     Home Medications:  Prior to Admission medications   Medication Sig Start Date End Date Taking? Authorizing Provider  acetaminophen (TYLENOL) 500 MG tablet Take 1,000 mg by mouth daily as needed (pain).    [provider]  albuterol (PROVENTIL) (2.5 MG/3ML) 0.083% nebulizer solution Take 3 mLs (2.5 mg total) by nebulization every 6 (six) hours as needed for wheezing or shortness of breath. 07/10/21   Hunsucker, Bonna Gains, MD  allopurinol (ZYLOPRIM) 100 MG tablet Take 100 mg by mouth every evening.    [provider]  amLODipine (NORVASC) 5 MG tablet Take 1 tablet (5 mg total) by mouth daily. 07/11/21   Bhagat, Crista Luria, PA  B Complex-C-Folic Acid (RENAL-VITE PO) Take 1 tablet by mouth daily.    [provider]  calcium acetate (PHOSLO) 667 MG capsule Take 1,334 mg by mouth See admin instructions. Take 1,334 by mouth twice a day when able to remember. 05/13/21   [provider]  carboxymethylcellulose 1 % ophthalmic solution Place 2 drops into both eyes 2 (two) times daily as needed (dry eyes).    [provider]  cetirizine (ZYRTEC) 10 MG tablet Take 10 mg by mouth daily as needed for allergies.    [provider]  cholecalciferol (VITAMIN D) 25 MCG tablet Take 1 tablet (1,000 Units total) by mouth every evening. 10/04/20   Gold, Wilder Glade, PA-C  cholecalciferol (VITAMIN D3) 10 MCG (400 UNIT) TABS tablet Take 800 Units by mouth daily.    [provider]  cinacalcet (SENSIPAR) 30 MG tablet Take 30 mg by mouth daily.  [provider]  cloNIDine (CATAPRES) 0.1 MG tablet Take 0.1 mg by mouth 3 (three) times daily.    [provider]  cyclobenzaprine (FLEXERIL) 10 MG tablet Take 10 mg by mouth 2 (two) times daily as needed for muscle spasms.    [provider]  Darbepoetin Alfa (ARANESP) 200 MCG/0.4ML SOSY injection Inject 0.4 mLs (200 mcg total) into the vein every Wednesday with hemodialysis. 10/10/20   Gold, Patrick Jupiter E, PA-C  Diclofenac Sodium (VOLTAREN EX) Apply 1 application. topically daily as needed (pain).    [provider]  docusate sodium (COLACE) 100 MG capsule Take 100-200 mg by mouth daily as needed for mild constipation.    [provider]  ferrous sulfate 325 (65 FE) MG tablet Take 325 mg by mouth See admin instructions. Take 325 mg by mouth three times a week    [provider]  folic acid (FOLVITE) 357 MCG tablet Take 400 mcg by mouth daily.    [provider]  gabapentin (NEURONTIN) 300 MG capsule Take 1 capsule (300 mg total) by mouth at bedtime as needed (Neuropathic pain). Patient taking differently: Take 300 mg by mouth daily as needed (Neuropathic pain). 12/14/17 06/26/21  Dana Allan I, MD  heparin 1000 unit/mL SOLN injection 3 mLs (3,000 Units total) by Dialysis route as needed (in dialysis as needed). 10/04/20   Jadene Pierini E, PA-C  heparin 1000 unit/mL SOLN injection 2 mLs (2,000 Units total) by Dialysis route as needed (in dialysis as needed). 10/04/20   Jadene Pierini E, PA-C  heparin 1000 unit/mL SOLN injection 1 mL (1,000 Units total) by Dialysis route as needed (in dialysis). 10/04/20   Jadene Pierini E, PA-C  hydrALAZINE (APRESOLINE) 100 MG tablet Take 1 tablet (100 mg total) by mouth every 8 (eight) hours. Patient taking differently: Take 100 mg by mouth 3 (three) times daily. 07/06/20   [name], MD  hydrocortisone (ANUSOL-HC) 25 MG suppository Place 25 mg rectally daily as needed for hemorrhoids.    [provider]  Ibuprofen-Acetaminophen (ADVIL DUAL ACTION PO) Take 3 tablets by mouth 2 (two) times daily as needed (pain).    [provider]  isosorbide mononitrate (IMDUR) 30 MG 24 hr tablet Take 1 tablet (30 mg total) by mouth daily. 02/20/21   Jerline Pain, MD  labetalol (NORMODYNE) 200 MG tablet Take 2 tablets (400 mg total) by mouth 3 (three) times daily. 07/11/21   Bhagat, Crista Luria, PA  lidocaine-prilocaine (EMLA) cream Apply 1 application. topically 2 (two) times daily as needed (pain in fingers).    [provider]  LORazepam (ATIVAN) 1 MG tablet Take 1 mg by mouth every 8 (eight) hours as needed for anxiety. 06/25/20   [provider]  montelukast (SINGULAIR) 10 MG tablet Take 10 mg by mouth daily with supper.     [provider]  multivitamin (RENA-VIT) TABS tablet Take 1 tablet by mouth daily with supper.    [provider]  olmesartan (BENICAR) 40 MG tablet Take 40 mg by mouth every evening. 05/06/19   [provider]  ondansetron (ZOFRAN) 4 MG tablet Take 4 mg by mouth 2 (two) times daily as needed for nausea or vomiting.    [provider]  OVER THE COUNTER MEDICATION Take by  mouth daily as needed (sickness). Elderberry Fruit and flower 1 dropper full by mouth daily as needed for sickness    [provider]  Oxycodone HCl 10 MG TABS Take 10 mg by mouth daily  as needed for pain. 01/11/21   [provider]  OXYGEN Inhale 2 L into the lungs as directed.    [provider]  pantoprazole (PROTONIX) 40 MG tablet Take 40 mg by mouth daily. 06/09/20   [provider]  polyethylene glycol powder (GLYCOLAX/MIRALAX) 17 GM/SCOOP powder Mix 17 grams into 8 ounces of liquid and drink by mouth daily. Patient taking differently: Take 17 g by mouth daily as needed (constipation). 03/19/21   Carollee Leitz, MD  predniSONE (DELTASONE) 5 MG tablet Take 4 tablets (20 mg total) by mouth daily with breakfast for 2 days, THEN 1 tablet (5 mg total) daily with breakfast. 06/29/21 12/28/21  Lacinda Axon, MD  Probiotic Product (PROBIOTIC PO) Take 1 capsule by mouth daily.    [provider]  sorbitol 70 % solution Take 30 mLs by mouth See admin instructions. Take 30 ml one- two times daily as needed for constipation 07/10/21   Hunsucker, Bonna Gains, MD  Tiotropium Bromide Monohydrate (SPIRIVA RESPIMAT) 2.5 MCG/ACT AERS Inhale 2 puffs into the lungs 2 (two) times daily as needed (wheezing/SOB). 07/10/21   Hunsucker, Bonna Gains, MD  VENTOLIN HFA 108 (90 Base) MCG/ACT inhaler Inhale 2 puffs into the lungs See admin instructions. Take 2 puffs every 4-6 hours as needed for wheezing and shortness of breath 07/10/21   Hunsucker, Bonna Gains, MD  zolpidem (AMBIEN) 5 MG tablet Take 5 mg by mouth at bedtime as needed for sleep. 02/04/21   [provider]    Inpatient Medications: Scheduled Meds:  allopurinol  100 mg Oral QPM   [START ON 07/17/2021] amLODipine  5 mg Oral Daily   [START ON 07/17/2021] Chlorhexidine Gluconate Cloth  6 each Topical Q0600   enoxaparin (LOVENOX) injection  30 mg Subcutaneous Q24H   montelukast  10 mg Oral Q supper   pantoprazole  40  mg Oral Daily   [START ON 07/17/2021] predniSONE  5 mg Oral Q breakfast   Continuous Infusions:  nitroGLYCERIN 10 mcg/min (07/16/21 1419)   PRN Meds: acetaminophen **OR** acetaminophen, albuterol, polyethylene glycol, Tiotropium Bromide Monohydrate, zolpidem  Allergies:    Allergies  Allergen Reactions   3-Methyl-2-Benzothiazolinone Hydrazone Other (See Comments)    Muscle weakness muscle cramping in legs Other reaction(s): Myalgias (Muscle Pain)   Banana Anaphylaxis, Swelling and Other (See Comments)    TONGUE AND MOUTH SWELLING   Black Walnut Flavor Anaphylaxis and Itching    Walnuts   Hazelnut ([place]) Allergy Skin Test Anaphylaxis    Hazelnuts   Leflunomide And Related Other (See Comments)    Severe Headache   Lisinopril Swelling    Angioedema  Swelling of the face   No Healthtouch Food Allergies Anaphylaxis    Spicy Mustard 4.7.2021 Pt reports that she eats Regular Yellow Mustard Swelling and itching of tongue   Other Other (See Comments), Anaphylaxis and Swelling    Cosentyx= angioedema  Other reaction(s): Facial Edema (intolerance) Mouth itches and tongue swelling Hazel nuts and pecans also Walnuts Walnuts Renal failure Other reaction(s): Other Serotonin Syndrome  Serotonin syndrome  Tremors Other reaction(s): Other Tremors Muscle weakness muscle cramping in legs Other reaction(s): Myalgias (Muscle Pain) Mouth itches and tongue swelling Hazel nuts and pecans also Walnuts    Pecan Extract Allergy Skin Test Anaphylaxis and Itching    Pecans    Pecan Nut (Diagnostic) Anaphylaxis and Itching    Pecans   Trazodone And Nefazodone Other (See Comments)    Serotonin Syndrome  Tremors   Adalimumab Other (See Comments)  Blisters on abdomen =humira   Escitalopram Oxalate Other (See Comments)    Hand problems - Serotonin Syndrome     Ezetimibe Other (See Comments)    myalgias cramps    Secukinumab Swelling    Cosentyx = angiodema    Statins Other  (See Comments)    Leg pains and weakness   Ferrlecit [Na Ferric Gluc Cplx In Sucrose]     Not reaction given from HD   Gabapentin Other (See Comments)    tremors   Ibuprofen Other (See Comments)    Renal Problems    Social History:   Social History   Socioeconomic History   Marital status: Divorced    Spouse name: BASIL   Number of children: 3   Years of education: 1   Highest education level: GED or equivalent  Occupational History   Occupation: retired Corporate treasurer  Tobacco Use   Smoking status: Former    Packs/day: 0.50    Years: 40.00    Pack years: 20.00    Types: Cigarettes    Quit date: 08/2020    Years since quitting: 0.9   Smokeless tobacco: Never  Vaping Use   Vaping Use: Never used  Substance and Sexual Activity   Alcohol use: Yes    Comment: occ   Drug use: No   Sexual activity: Not on file  Other Topics Concern   Not on file  Social History Narrative   Not on file   Social Determinants of Health   Financial Resource Strain: Not on file  Food Insecurity: Not on file  Transportation Needs: Not on file  Physical Activity: Not on file  Stress: Not on file  Social Connections: Not on file  Intimate Partner Violence: Not on file    Family History:    Family History  Problem Relation Age of Onset   Lung cancer Mother    Diabetes Sister    Heart disease Sister    Asthma Daughter    Arthritis Daughter    Arthritis Daughter    Thyroid cancer Other        1/2 sister   Heart disease Other        1/2 sister     ROS:  Please see the history of present illness.   All other ROS reviewed and negative.     Physical Exam/Data:   Vitals:   07/16/21 1430 07/16/21 1500 07/16/21 1515 07/16/21 1600  BP: (!) 198/85 (!) 178/51 (!) 170/79 (!) 184/78  Pulse: 75 79 78 77  Resp: 20 17 (!) 21 19  Temp:      TempSrc:      SpO2: 100% 96% 97% 100%  Weight:      Height:        Intake/Output Summary (Last 24 hours) at 07/16/2021 1616 Last data filed at 07/16/2021  1419 Gross per 24 hour  Intake 2.07 ml  Output --  Net 2.07 ml      07/16/2021   10:54 AM 07/11/2021    2:03 PM 07/10/2021   11:03 AM  Last 3 Weights  Weight (lbs) 142 lb 3.2 oz 146 lb 140 lb 6.4 oz  Weight (kg) 64.5 kg 66.225 kg 63.685 kg     Body mass index is 26.87 kg/m.  General: Chronically ill-appearing female, appears older than stated age 60: normal Neck: no JVD Vascular: No carotid bruits; Distal pulses 2+ bilaterally Cardiac:  normal S1, S2; RRR; 3/6 systolic murmur  Lungs:  clear to auscultation bilaterally, no wheezing,  rhonchi or rales  Abd: soft, nontender, no hepatomegaly  Ext: no edema Musculoskeletal:  No deformities, BUE and BLE strength normal and equal Skin: warm and dry  Neuro:  CNs 2-12 intact, no focal abnormalities noted Psych:  Normal affect   EKG:  The EKG was personally reviewed and demonstrates:  sinus rhythm, 74 bpm, atrial enlargement but no acute ST/T wave abnormalities Telemetry:  Telemetry was personally reviewed and demonstrates:  Sinus Rhythm   Relevant CV Studies:  Cath: 09/2020  Conclusions: Severe two-vessel coronary artery disease with heavily calcified disease of up to 80% involving the proximal and mid LAD as well as up to 90% involving the mid RCA. Mild-moderate, non-obstructive disease noted in the LCx territory. Mildly elevated left ventricular filling pressure (LVEDP 15-20 mmHg).   Recommendations: Given severe two-vessel CAD with heavy, eccentric calcium (including in the proximal LAD) and the patient's history of diabetes, recommend cardiac surgery consultation for CABG. Improve blood pressure control and wean IV nitroglycerin, as tolerated.   Nelva Bush, MD [facility]  Diagnostic Dominance: Right   Echo: 06/26/2021  IMPRESSIONS     1. Left ventricular ejection fraction, by estimation, is 70 to 75%. The  left ventricle has hyperdynamic function. The left ventricle has no  regional wall motion  abnormalities. There is severe left ventricular  hypertrophy. Left ventricular diastolic  parameters are consistent with Grade II diastolic dysfunction  (pseudonormalization). Elevated left ventricular end-diastolic pressure.   2. Right ventricular systolic function is normal. The right ventricular  size is normal. There is normal pulmonary artery systolic pressure.   3. Left atrial size was severely dilated.   4. Right atrial size was mildly dilated.   5. The mitral valve is degenerative. Mild mitral valve regurgitation. No  evidence of mitral stenosis. Severe mitral annular calcification.   6. Tricuspid valve regurgitation is mild to moderate.   7. Visually, by planimetry, and by DI aortic valve has moderate stenosis.  However, peak velocity >4, suggesting severe stenosis. This does not  appear to be explained by LVOT gradient/obstruction. The aortic valve is  tricuspid. There is moderate  calcification of the aortic valve. Aortic valve regurgitation is not  visualized. Moderate to severe aortic valve stenosis.   8. The inferior vena cava is dilated in size with <50% respiratory  variability, suggesting right atrial pressure of 15 mmHg.   Comparison(s): Changes from prior study are noted. Moderate to severe  aortic stenosis; peak velocity >4, but by visual/planimetry/DI suggests  moderate.   FINDINGS   Left Ventricle: Left ventricular ejection fraction, by estimation, is 70  to 75%. The left ventricle has hyperdynamic function. The left ventricle  has no regional wall motion abnormalities. The left ventricular internal  cavity size was normal in size.  There is severe left ventricular hypertrophy. Left ventricular diastolic  parameters are consistent with Grade II diastolic dysfunction  (pseudonormalization). Elevated left ventricular end-diastolic pressure.   Right Ventricle: The right ventricular size is normal. Right vetricular  wall thickness was not well visualized. Right  ventricular systolic  function is normal. There is normal pulmonary artery systolic pressure.  The tricuspid regurgitant velocity is 2.58  m/s, and with an assumed right atrial pressure of 8 mmHg, the estimated  right ventricular systolic pressure is 82.9 mmHg.   Left Atrium: Left atrial size was severely dilated.   Right Atrium: Right atrial size was mildly dilated.   Pericardium: Trivial pericardial effusion is present.   Mitral Valve: The mitral valve is degenerative in  appearance. Severe  mitral annular calcification. Mild mitral valve regurgitation. No evidence  of mitral valve stenosis.   Tricuspid Valve: The tricuspid valve is normal in structure. Tricuspid  valve regurgitation is mild to moderate. No evidence of tricuspid  stenosis.   Aortic Valve: Visually, by planimetry, and by DI aortic valve has moderate  stenosis. However, peak velocity >4, suggesting severe stenosis. This does  not appear to be explained by LVOT gradient/obstruction. The aortic valve  is tricuspid. There is  moderate calcification of the aortic valve. Aortic valve regurgitation is  not visualized. Moderate to severe aortic stenosis is present. Aortic  valve mean gradient measures 41.3 mmHg. Aortic valve peak gradient  measures 73.2 mmHg. Aortic valve area, by  VTI measures 1.27 cm.   Pulmonic Valve: The pulmonic valve was not well visualized. Pulmonic valve  regurgitation is not visualized. No evidence of pulmonic stenosis.   Aorta: The aortic root, ascending aorta, aortic arch and descending aorta  are all structurally normal, with no evidence of dilitation or  obstruction.   Venous: The inferior vena cava is dilated in size with less than 50%  respiratory variability, suggesting right atrial pressure of 15 mmHg.   IAS/Shunts: The atrial septum is grossly normal.   Additional Comments: There is a small pleural effusion in both left and  right lateral regions.   Laboratory Data:  High  Sensitivity Troponin:   Recent Labs  Lab 07/16/21 1059 07/16/21 1316  TROPONINIHS 15 17     Chemistry Recent Labs  Lab 07/16/21 1059  NA 139  K 4.0  CL 100  CO2 28  GLUCOSE 75  BUN 22  CREATININE 4.49*  CALCIUM 8.1*  MG 1.7  GFRNONAA 11*  ANIONGAP 11    No results for input(s): PROT, ALBUMIN, AST, ALT, ALKPHOS, BILITOT in the last 168 hours. Lipids No results for input(s): CHOL, TRIG, HDL, LABVLDL, LDLCALC, CHOLHDL in the last 168 hours.  Hematology Recent Labs  Lab 07/16/21 1059  WBC 4.8  RBC 2.38*  HGB 7.6*  HCT 25.7*  MCV 108.0*  MCH 31.9  MCHC 29.6*  RDW 17.4*  PLT 152   Thyroid No results for input(s): TSH, FREET4 in the last 168 hours.  BNPNo results for input(s): BNP, PROBNP in the last 168 hours.  DDimer No results for input(s): DDIMER in the last 168 hours.   Radiology/Studies:  DG Chest Portable 1 View  Result Date: 07/16/2021 CLINICAL DATA:  Chest pain that radiates to the back. History of CHF. EXAM: PORTABLE CHEST 1 VIEW COMPARISON:  Chest radiograph dated Jun 27, 2021 FINDINGS: The heart is enlarged. Evidence of prior coronary artery bypass grafting. Atherosclerotic calcification of the aortic arch. Prominence of the central pulmonary vessels. No appreciable pleural effusion or frank pulmonary edema. Sternotomy wires are intact. Partially imaged anterior cervical discectomy and fusion hardware. IMPRESSION: 1.  Stable cardiomegaly. 2. Pulmonary vascular congestion without evidence of focal consolidation or pleural effusion. Electronically Signed   By: Keane Police D.O.   On: 07/16/2021 11:32     Assessment and Plan:   Jodi Bell is a 61 y.o. female with a hx of COPD/chronic hypoxic respiratory failure on 2 to 3 L, CAD status post CABG x2 '22, hypertension, ESRD on HD, OSA (noncompliant with CPAP), paroxysmal atrial fibrillation postoperatively, diabetes who is being seen 07/16/2021 for the evaluation of chest pain at the request of Dr.  Jeanell Sparrow.  Hypertensive emergency: Presented with significantly elevated blood pressures with systolic greater than 081.  Reports that her blood pressures were elevated after dialysis yesterday.  States she has been compliant with her medications.  Started on IV nitroglycerin while in the ED with some improvement.  Suspect she does have some component of volume overload as she states she was a kilograms over her dry weight at HD yesterday as well. --Continue IV nitroglycerin with plans to wean --Would continue her home medications including amlodipine 10 mg daily clonidine 0.1 mg 3 times daily, hydralazine 100 mg 3 times daily, olmesartan 40 mg daily.  Home labetalol dose 400 mg 3 times daily, may benefit from transition to [place]?  -- May be able to taper pending her response to HD.  Chest pain/CAD status post CABG' 22 (LIMA to LAD, SVG to PDA: Has been having intermittent episodes of chest discomfort over the past several weeks.  She had originally increase her Imdur from 30 mg to 60 mg daily with some improvement.  Has decreased back down to 30 mg daily.  EKG without ischemia, high-sensitivity troponin 15>> 17 despite her significantly elevated blood pressures on admission. --Needs better BP control, could consider further ischemic evaluation once BP improves with Lexiscan Myoview  ESRD on HD: Follows a Monday Wednesday Friday schedule, went to her session yesterday. --Suspect she may be volume up as she reports she thinks she has lost some weight -- Per nephrology   COPD: O2 dependent, states that she has not done well since having COVID in December --Follows with pulmonology  Aortic stenosis: Last interpreted as mild to moderate based off office note from 1/6 per Dr. Marlou Porch review.  Diabetes: Poorly controlled --SSI  Risk Assessment/Risk Scores:   HEAR Score (for undifferentiated chest pain):  HEAR Score: 4{   For questions or updates, please contact [place] Please consult  [email or web address] for contact info under    Signed, Reino Bellis, NP  07/16/2021 4:16 PM

## 2021-07-16 NOTE — ED Notes (Signed)
Pt transported to dialysis

## 2021-07-16 NOTE — ED Notes (Signed)
Eating lunch 

## 2021-07-16 NOTE — ED Notes (Signed)
Titrated off nitro drip per MD request.

## 2021-07-16 NOTE — Hospital Course (Addendum)
AM Interview 07/22/21: She last stooled day before yesterday. Her back is killing her, the bed is uncomfortable. Lidocaine path helped. She is open to trying a low-dose muscle relaxer.  1. 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 She described the pain as pressure and localized pain to the left side of her chest with radiation to her back. Symptoms likely due to symptomatic hypertension as the patient presented with systolic pressures in the 200s. Ischemia unlikely given stable troponins at 15 and 17 with no evidence of ischemia on EKG. The patient's pain resolved during admission with improvement of her blood pressure. Cardiology was consulted on the patient and plans to see her as an outpatient for stress test.   2. Hypertensive Emergency Patient presented to the ED hypertensive to 218/112. She was started on a nitro drip in the ED and then slowly transitioned to home meds: Labetalol 400 mg TID, Norvasc 5 mg QD, Hydralazine 100 mg TID, Clonidine 0.1 mg TID, Olmesartan 40 mg QD. Patient's blood pressure has been improving with her dialysis sessions to remove fluid. Plan for outpatient management of her hypertension at discharge.   3. Acute on Chronic Anemia, *** Patient has known macrocytic anemia with baseline hemoglobin around *** and MCV greater than 100. Patient's folate and B12 were within normal limits. Iron studies showed low TIBC in addition to a significantly elevated ferritin last year that could indicate anemia of chronic disease possibly secondary to her ESRD on hemodialysis. Reticulocyte index calculated to be 0.84 indicating hypoproliferative anemia. Over the course of admission, she developed melena and hematochezia and required transfusion of one unit of blood on two separate occasions. GI performed pill endoscopy that showed ***. On patient's repeat CBCs, macrocytosis resolved, and  the patient appeared to have more of a normocytic anemia. The patient is known to have internal and external hemorrhoids. Unclear whether this bleeding is secondary to internal hemorrhoids versus other GI bleed. On the day of discharge, the patient's hemoglobin was stable at ***. Will encourage continued GI follow-up at discharge.    4. End Stage Renal Disease on MWF Hemodialysis Patient with history of ESRD on MWF dialysis. GFR was 11 on admission. Patient's CXR on admission showed pulmonary vascular congestion, patient also with elevated JVD to suggest fluid overload. Nephrology followed the patient throughout admission and gave her a few additional sessions of dialysis to pull fluid off, which appeared to improve her blood pressure. Patient's dry weight appears to be 64 kg. Plan to continue MWF HD as an outpatient with goal weight 64 kg.   5. COPD, OSA (CPAP noncompliant) Patient with longstanding COPD on 3 L supplemental oxygen at home in addition to tiotropium, montelukast, and Albuterol rescue inhalers. Last exacerbation was within the past month. Patient's O2 saturation remained >90% on 2L via nasal cannula. No evidence of COPD exacerbation during this hospital admisison.   6. History of Type 2 Diabetes Mellitus Patient with previous type 2 diabetes mellitus. Her A1C has been as high as 6.9 several years ago. Most recent A1C of 5.2 in August 2022. She is not currently on diabetes medication at home.    7. GERD, History of bleeding esophageal ulcer, Esophagitis Patient with history of GERD as well as esophagitis with bleeding esophageal ulcer in April 2021. Patient had a pill endoscopy during this admission that showed ***.    8. Gout Patient with previous  episodes of gout on Allopurinol at home. This was continued during admission and will be continued at discharge.   9. Psoriatic Arthritis Patient with osteoarthritis and psoriatic arthritis on prednisone 5 mg at home. This was continued  during admission and will be continued at discharge.   10. Insomnia Patient with insomnia on Ambien at home. This was continued prn during admission and will be continued at discharge.

## 2021-07-16 NOTE — ED Notes (Signed)
Unable to go to dialysis at this time due to nitro drip.  MD made aware.

## 2021-07-16 NOTE — ED Notes (Signed)
Nephro at bedside

## 2021-07-16 NOTE — H&P (Cosign Needed Addendum)
Date: 07/16/2021               Patient Name:  Jodi Bell MRN: [id number]  DOB: Jan 01, 1961 Age / Sex: 61 y.o., female   PCP: [name]., MD              Medical Service: Internal Medicine Teaching Service              Attending Physician: Dr. Velna Ochs, MD    First Contact: Elane Fritz, MS3 Pager: [phone]  Second Contact: [name], MD Pager: [phone]  Third Contact Gaylan Gerold, MD Pager: [phone]       After Hours (After 5p/  First Contact Pager: [phone]  weekends / holidays): Second Contact Pager: [phone]   Chief Complaint: Chest Pain  History of Present Illness: [name] is a 61 y.o. female with past medical history of hypertension, type 2 diabetes mellitus, COPD on 3 L supplemental oxygen at baseline, ESRD on HD MWF, aortic stenosis who presents with left sided chest pain beginning this morning around 0800. She describes the pain as pressure that "grips my heart" and localizes pain to the left side of her chest with radiation to her back. She states that she first noticed the pain while she was folding clothes this morning. She decided to lay back down due to the pain, but rest did not alleviate her pain. She states the pain did not initially improve with nitro from EMS, but after she received additional nitro in the ED, her pain has improved. She also endorses residual cough and dyspnea that has somewhat improved but has not resolved after being admitted to the hospital last month for COPD exacerbation secondary to adenovirus. She states that she had been coughing all night last night. She states that she does not remember taking her medications this morning because she had chest pain that prompted her to call EMS. Of note, the patient did report having some chest pain last week described as pinching pain similar to the pain that she had prior to needing CABG last year. She describes her pain today as pressure that feels different than her pain last week.   In  the ED, the patient was found to be hypertensive with pressures as high as 218/112. Initial troponin was 15, serial troponin stable at 17. Electrolytes within normal limits. Creatinine elevated to 4.49 consistent with her prior history of CKD. Patient's hemoglobin was 7.6, down from 8.5 approximately 2 weeks ago. Appears to be macrocytic anemia with MCV 108.0. The patient received PO nitroglycerin prior to arrival to the ED, and she was placed on a nitroglycerin drip in the ED.   She states that she saw her cardiologist last week who adjusted her blood pressure and heart rate medications given that they were concerned her heart rate was too high. She states that at that time she had chest pain described as pinching, and she was told that they would proceed with a stress test if she continued to have that pain. She reports that her pain today feels different than the pain that she had at that time.  Per chart review, the patient was recently admitted last month for COPD exacerbation secondary to URI. She was given stress dose Prednisone 40 mg x 5 days with taper back to 5 mg qd and Azithromycin 500 mg x 3 days in addition to duonebs, home montelukast. At that time she also had systolic blood pressure in the 180s on admission.  Meds:  Current Outpatient Medications  Medication Instructions   acetaminophen (TYLENOL) 1,000 mg, Oral, Daily PRN   albuterol (PROVENTIL) 2.5 mg, Nebulization, Every 6 hours PRN   allopurinol (ZYLOPRIM) 100 mg, Oral, Every evening   amLODipine (NORVASC) 5 mg, Oral, Daily   B Complex-C-Folic Acid (RENAL-VITE PO) 1 tablet, Oral, Daily   calcium acetate (PHOSLO) 1,334 mg, Oral, See admin instructions, Take 1,334 by mouth twice a day when able to remember.   carboxymethylcellulose 1 % ophthalmic solution 2 drops, Both Eyes, 2 times daily PRN   cetirizine (ZYRTEC) 10 mg, Oral, Daily PRN   cholecalciferol (VITAMIN D3) 800 Units, Oral, Daily   cinacalcet (SENSIPAR) 30 mg, Oral,  Daily   cloNIDine (CATAPRES) 0.1 mg, Oral, 3 times daily   cyclobenzaprine (FLEXERIL) 10 mg, Oral, 2 times daily PRN   Darbepoetin Alfa (ARANESP) 200 mcg, Intravenous, Every Wed (Hemodialysis)   Diclofenac Sodium (VOLTAREN EX) 1 application., Apply externally, Daily PRN   docusate sodium (COLACE) 100-200 mg, Oral, Daily PRN   ferrous sulfate 325 mg, Oral, See admin instructions, Take 325 mg by mouth three times a week   folic acid (FOLVITE) 122 mcg, Oral, Daily   gabapentin (NEURONTIN) 300 mg, Oral, At bedtime PRN   heparin 3,000 Units, Dialysis, As needed   heparin 2,000 Units, Dialysis, As needed   heparin 1,000 Units, Dialysis, As needed   hydrALAZINE (APRESOLINE) 100 mg, Oral, Every 8 hours   hydrocortisone (ANUSOL-HC) 25 mg, Rectal, Daily PRN   Ibuprofen-Acetaminophen (ADVIL DUAL ACTION PO) 3 tablets, Oral, 2 times daily PRN   isosorbide mononitrate (IMDUR) 30 mg, Oral, Daily   labetalol (NORMODYNE) 400 mg, Oral, 3 times daily   lidocaine-prilocaine (EMLA) cream 1 application., Topical, 2 times daily PRN   LORazepam (ATIVAN) 1 mg, Oral, Every 8 hours PRN   montelukast (SINGULAIR) 10 mg, Oral, Daily with supper   multivitamin (RENA-VIT) TABS tablet 1 tablet, Oral, Daily with supper   olmesartan (BENICAR) 40 mg, Oral, Every evening   ondansetron (ZOFRAN) 4 mg, Oral, 2 times daily PRN   OVER THE COUNTER MEDICATION Oral, Daily PRN, Elderberry Fruit and flower<BR>1 dropper full by mouth daily as needed for sickness   Oxycodone HCl 10 mg, Oral, Daily PRN   OXYGEN 2 L, Inhalation, As directed   pantoprazole (PROTONIX) 40 mg, Oral, Daily   polyethylene glycol powder (GLYCOLAX/MIRALAX) 17 GM/SCOOP powder Mix 17 grams into 8 ounces of liquid and drink by mouth daily.   predniSONE (DELTASONE) 5 MG tablet Take 4 tablets (20 mg total) by mouth daily with breakfast for 2 days, THEN 1 tablet (5 mg total) daily with breakfast.   Probiotic Product (PROBIOTIC PO) 1 capsule, Oral, Daily   sorbitol 70  % solution 30 mLs, Oral, See admin instructions, Take 30 ml one- two times daily as needed for constipation   Tiotropium Bromide Monohydrate (SPIRIVA RESPIMAT) 2.5 MCG/ACT AERS 2 puffs, Inhalation, 2 times daily PRN   VENTOLIN HFA 108 (90 Base) MCG/ACT inhaler 2 puffs, Inhalation, See admin instructions, Take 2 puffs every 4-6 hours as needed for wheezing and shortness of breath   Vitamin D3 (VITAMIN D) 1,000 Units, Oral, Every evening   zolpidem (AMBIEN) 5 mg, Oral, At bedtime PRN     Allergies: Allergies as of 07/16/2021 - Review Complete 07/16/2021  Allergen Reaction Noted   3-methyl-2-benzothiazolinone hydrazone Other (See Comments) 09/07/2012   Banana Anaphylaxis, Swelling, and Other (See Comments) 01/21/2018   Black walnut flavor Anaphylaxis and Itching 05/18/2019   Hazelnut (filbert) allergy  skin test Anaphylaxis 05/18/2019   Leflunomide and related Other (See Comments) 07/23/2012   Lisinopril Swelling 01/05/2014   No healthtouch food allergies Anaphylaxis 05/18/2019   Other Other (See Comments), Anaphylaxis, and Swelling 09/07/2012   Pecan extract allergy skin test Anaphylaxis and Itching 05/18/2019   Pecan nut (diagnostic) Anaphylaxis and Itching 05/18/2019   Trazodone and nefazodone Other (See Comments) 07/23/2014   Adalimumab Other (See Comments) 07/23/2012   Escitalopram oxalate Other (See Comments) 09/29/2013   Ezetimibe Other (See Comments) 01/13/2018   Secukinumab Swelling 05/18/2019   Statins Other (See Comments) 09/04/2014   Ferrlecit [na ferric gluc cplx in sucrose]  09/19/2020   Gabapentin Other (See Comments) 06/26/2021   Ibuprofen Other (See Comments) 09/04/2014   Past Medical History:  Diagnosis Date   Anemia of chronic disease    Anxiety    Arthritis    oa and psoriatic ra   Asthma    Back pain, chronic    lower back   Candida infection finished tx 2 days ago   Chronic insomnia    COPD (chronic obstructive pulmonary disease) ([place])    Coronary artery  calcification seen on CT scan    Diabetes mellitus    type 2   Eczema    ESRD on dialysis [facility])    Essential hypertension    GERD (gastroesophageal reflux disease)    Gout    History of hiatal hernia    Hypoalbuminemia    Hyponatremia    Mild aortic stenosis    Mitral regurgitation    Mitral stenosis    Obesity    PAD (peripheral artery disease) ([place])    a. externial iliac calcification seen on CT 04/2020.   Pneumonia few yrs ago x 2   Sleep apnea    Tobacco abuse    Upper GI bleed 05/2019    Family History: Mother with lung cancer. Sister with diabetes and heart disease. Daughter with Asthma. No other known history.  Social History: Lives with daughter and son in [place]. Independent with iADLs and ADLs. Former tobacco use with 20 pack year history. Quit 1 year ago. Endorses 1 glass of wine twice a month. No illicit drug use.  Review of Systems: A complete ROS was negative except as per HPI.   Physical Exam: Blood pressure (!) 170/79, pulse 78, temperature 98.2 F (36.8 C), temperature source Oral, resp. rate (!) 21, height 5\' 1"  (1.549 m), weight 64.5 kg, SpO2 97 %. Constitutional: Appears well-developed and well-nourished. No distress.  HENT: Normocephalic and atraumatic, EOMI, conjunctiva normal, moist mucous membranes Cardiovascular: Normal rate, regular rhythm, S1 and S2 present, 3/6 pounding systolic murmur, no rubs or gallops.  Distal pulses intact. JVD noted up to the right ear.   Respiratory: Effort is normal on room air.  Lungs are clear to auscultation bilaterally. GI: Soft. Non-distended. Non-tender. No appreciable organomegaly.  Musculoskeletal: Normal bulk and tone.  No peripheral edema noted. Neurological: Alert and oriented x4, no apparent focal deficits noted. Skin: Warm and dry.  No rash, erythema, lesions noted. Psychiatric: Normal mood and affect. Behavior is normal.   Labs: CBC    Component Value Date/Time   WBC 4.8 07/16/2021 1059   RBC 2.38 (L)  07/16/2021 1059   HGB 7.6 (L) 07/16/2021 1059   HCT 25.7 (L) 07/16/2021 1059   PLT 152 07/16/2021 1059   MCV 108.0 (H) 07/16/2021 1059   MCV 94.5 10/12/2012 1211   MCH 31.9 07/16/2021 1059   MCHC 29.6 (L) 07/16/2021 1059  RDW 17.4 (H) 07/16/2021 1059   LYMPHSABS 0.3 (L) 06/28/2021 0346   MONOABS 0.3 06/28/2021 0346   EOSABS 0.0 06/28/2021 0346   BASOSABS 0.0 06/28/2021 0346     CMP     Component Value Date/Time   NA 139 07/16/2021 1059   K 4.0 07/16/2021 1059   CL 100 07/16/2021 1059   CO2 28 07/16/2021 1059   GLUCOSE 75 07/16/2021 1059   BUN 22 07/16/2021 1059   CREATININE 4.49 (H) 07/16/2021 1059   CREATININE 1.34 (H) 09/18/2011 1400   CALCIUM 8.1 (L) 07/16/2021 1059   PROT 6.4 (L) 06/26/2021 0650   ALBUMIN 2.4 (L) 06/28/2021 0905   AST 18 06/26/2021 0650   ALT 12 06/26/2021 0650   ALKPHOS 206 (H) 06/26/2021 0650   BILITOT 0.5 06/26/2021 0650   GFRNONAA 11 (L) 07/16/2021 1059   GFRAA 16 (L) 11/09/2019 1152    Imaging: DG Chest Portable 1 View  Result Date: 07/16/2021 CLINICAL DATA:  Chest pain that radiates to the back. History of CHF. EXAM: PORTABLE CHEST 1 VIEW COMPARISON:  Chest radiograph dated Jun 27, 2021 FINDINGS: The heart is enlarged. Evidence of prior coronary artery bypass grafting. Atherosclerotic calcification of the aortic arch. Prominence of the central pulmonary vessels. No appreciable pleural effusion or frank pulmonary edema. Sternotomy wires are intact. Partially imaged anterior cervical discectomy and fusion hardware. IMPRESSION: 1.  Stable cardiomegaly. 2. Pulmonary vascular congestion without evidence of focal consolidation or pleural effusion. Electronically Signed   By: Keane Police D.O.   On: 07/16/2021 11:32    EKG: personally reviewed my interpretation is normal sinus rhythm with no acute ischemia  CXR: personally reviewed my interpretation is cardiomegaly, mild pulmonary vascular congestion.   Assessment & Plan by Problem: Principal  Problem:   Chest pain  TERIE LEAR is a 61 y.o. female with past medical history of hypertension, type 2 diabetes mellitus, COPD on 3 L supplemental oxygen at baseline, ESRD on HD MWF who presents with left sided chest pain beginning this morning around 0800. She was found to be hypertensive to 218/112 in the ED.   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 She describes the pain as pressure and localizes pain to the left side of her chest with radiation to her back. She states the pain was initially 7/10 but improved to 4/10 with nitroglycerin. Patient reports chest pain largely resolved while on nitro gtt. Presentation consistent with unstable angina given her pain started with exertion and stable troponins at 15 and 17. Unlikely that her pain is due to aortic dissection given symmetric pulses, resolution of pain with nitroglycerin. Unlikely GERD given that her pain feels different than previous GERD.    -Cardiology following, appreciate recommendations -Wean off Nitro drip as able, close monitoring of BP -Lipid panel  Hypertensive Emergency Patient presented to the ED hypertensive to 218/112. She was started on a nitro drip in the ED. Blood pressure during our evaluation was 160/112. Home medications include Labetalol 400 mg TID, Norvasc 5 mg QD, Hydralazine 100 mg TID, Clonidine 0.1 mg TID, Olmesartan 40 mg QD. She states that she has been medication compliant. She does not remember if she took her meds this morning given her chest pain. Plan to slowly bring blood pressure down, wean off nitro drip, slowly start back home medications starting with Amlodipine 5 mg tomorrow morning. -Wean off Nitro drip -Slowly restart  home meds with nitro taper  Macrocytic Anemia Patient presents with hemoglobin of 7.6, down from 8.5 approximately 2 weeks ago. Patient's MCV is elevated at 108.0. Vitamin B12 and  folate were most recently checked in 16-May-2013. Iron studies most recently checked in 05/16/2020 did not show evidence of iron deficiency anemia. She does state that she receives iron supplementation during dialysis. -Vitamin B12 -Folate -Iron studies -Reticulocyte count  Pulmonary Vascular Congestion End Stage Renal Disease on MWF Hemodialysis Patient with history of ESRD on MWF dialysis. GFR is 11 on admission. Nephrology is following. Patient's CXR shows pulmonary vascular congestion, patient also with elevated JVD to suggest fluid overload. The patient reports her dry weight is 60 kg, and she left her dialysis center 0.5 kg above her dry weight. Current weight 64.5 kg. -Nephrology following, appreciate recommendations. -Dialysis today for hypervolemia  COPD OSA (CPAP noncompliant) Chronic respiratory failure  Patient with longstanding COPD on 3 L supplemental oxygen at home in addition to tiotropium, montelukast, and Albuterol rescue inhalers. Last exacerbation was within the past month. Patient currently with oxygen saturation of 100% on 2L O2 via nasal cannula and does not appear to be in exacerbation at this time.  -PRN albuterol -Home montelukast -Home tiotropium -CPAP at night -PT/OT eval and treat  History of Type 2 Diabetes Mellitus Patient with previous type 2 diabetes mellitus. Her A1C has been as high as 6.9 several years ago. Most recent A1C of 5.2 in August 2022. She is not currently on diabetes medication at home.   GERD History of bleeding esophageal ulcer History of Esophagitis Patient with history of GERD as well as esophagitis with bleeding esophageal ulcer in April 2021. She is on pantoprazole 40 mg at home. This was continued during admission and will be continued at discharge.   -Pantoprazole 40 mg qd  Gout Patient with previous episodes of gout on Allopurinol at home. Plan to continue during admission. -Allopurinol 100 mg.   Psoriatic Arthritis Patient with  osteoarthritis and psoriatic arthritis on prednisone 5 mg at home. Plan to continue this during admisison. -Prednisone 5 mg  Insomnia Continue home ambien qhs prn  Diet: Renal VTE: Heparin IVF: None Code: Full   Prior to Admission Living Arrangement: Home Anticipated Discharge Location: TBD Barriers to Discharge: Medical Stability Dispo: Anticipated discharge in approximately 1-2 day(s).    Dispo: Admit patient to Observation with expected length of stay less than 2 midnights.  Signed: Elane Fritz, Medical Student 07/16/2021, 3:41 PM  Pager: [phone]   Attestation for Student Documentation:  I personally was present and performed or re-performed the history, physical exam and medical decision-making activities of this service and have verified that the service and findings are accurately documented in the student's note.  [name], MD 07/16/2021, 4:18 PM

## 2021-07-16 NOTE — ED Notes (Signed)
Notified mini lab to draw hep and HIV tubes

## 2021-07-16 NOTE — ED Triage Notes (Signed)
Patient complains of chest pain this am that radiates to back.  Denies all other symptoms.  Patient took 324mg  ASA prior to EMS arrival. Pain was 7/10 got 2 nitro from EMS now pain is 4/10 Dialysis patient MWF had dialysis yesterday and normal chair time.  Hx CHF had valves replaced last year.

## 2021-07-16 NOTE — ED Provider Notes (Signed)
[facility] EMERGENCY DEPARTMENT Provider Note   CSN: [id number] Arrival date & time: 07/16/21  1046     History  Chief Complaint  Patient presents with   Chest Pain    Jodi Bell is a 61 y.o. female.  HPI 61 year old female end-stage renal disease, on dialysis, history of coronary artery disease, status post CABG times 03/20/2020, history of COPD, chronic hypoxic respiratory failure, on oxygen, aortic stenosis and diabetes who presents today complaining of left-sided chest pain.  States pain is pressure-like in nature.  It radiates back from the left side through to her back.  She states is more severe than prior pain.  EMS reports it was initially 7 out of 10 and patient was significantly hypertensive with diastolic pressures over 301.  She received nitroglycerin x2 with decrease in blood pressure and pain from 7 out of 10 to 4 out of 10.  Patient states that she has continued to have some cough and dyspnea since she had adenovirus several weeks ago but does not identify any new symptoms associated with this.  She denies increased shortness of breath, nausea, vomiting, diarrhea.  She denies any trauma to her chest.    Home Medications Prior to Admission medications   Medication Sig Start Date End Date Taking? Authorizing Provider  acetaminophen (TYLENOL) 500 MG tablet Take 1,000 mg by mouth daily as needed (pain).    [provider]  albuterol (PROVENTIL) (2.5 MG/3ML) 0.083% nebulizer solution Take 3 mLs (2.5 mg total) by nebulization every 6 (six) hours as needed for wheezing or shortness of breath. 07/10/21   Hunsucker, Bonna Gains, MD  allopurinol (ZYLOPRIM) 100 MG tablet Take 100 mg by mouth every evening.    [provider]  amLODipine (NORVASC) 5 MG tablet Take 1 tablet (5 mg total) by mouth daily. 07/11/21   Bhagat, Crista Luria, PA  B Complex-C-Folic Acid (RENAL-VITE PO) Take 1 tablet by mouth daily.    [provider]  calcium acetate  (PHOSLO) 667 MG capsule Take 1,334 mg by mouth See admin instructions. Take 1,334 by mouth twice a day when able to remember. 05/13/21   [provider]  carboxymethylcellulose 1 % ophthalmic solution Place 2 drops into both eyes 2 (two) times daily as needed (dry eyes).    [provider]  cetirizine (ZYRTEC) 10 MG tablet Take 10 mg by mouth daily as needed for allergies.    [provider]  cholecalciferol (VITAMIN D) 25 MCG tablet Take 1 tablet (1,000 Units total) by mouth every evening. 10/04/20   Gold, Wilder Glade, PA-C  cholecalciferol (VITAMIN D3) 10 MCG (400 UNIT) TABS tablet Take 800 Units by mouth daily.    [provider]  cinacalcet (SENSIPAR) 30 MG tablet Take 30 mg by mouth daily.    [provider]  cloNIDine (CATAPRES) 0.1 MG tablet Take 0.1 mg by mouth 3 (three) times daily.    [provider]  cyclobenzaprine (FLEXERIL) 10 MG tablet Take 10 mg by mouth 2 (two) times daily as needed for muscle spasms.    [provider]  Darbepoetin Alfa (ARANESP) 200 MCG/0.4ML SOSY injection Inject 0.4 mLs (200 mcg total) into the vein every Wednesday with hemodialysis. 10/10/20   Gold, Patrick Jupiter E, PA-C  Diclofenac Sodium (VOLTAREN EX) Apply 1 application. topically daily as needed (pain).    [provider]  docusate sodium (COLACE) 100 MG capsule Take 100-200 mg by mouth daily as needed for mild constipation.    [provider]  ferrous sulfate 325 (65 FE) MG tablet Take 325 mg by mouth See admin instructions. Take 325 mg by mouth three times a week    [provider]  folic acid (FOLVITE) 983 MCG tablet Take 400 mcg by mouth daily.    [provider]  gabapentin (NEURONTIN) 300 MG capsule Take 1 capsule (300 mg total) by mouth at bedtime as needed (Neuropathic pain). Patient taking differently: Take 300 mg by mouth daily as needed (Neuropathic pain). 12/14/17 06/26/21  Dana Allan I, MD  heparin 1000  unit/mL SOLN injection 3 mLs (3,000 Units total) by Dialysis route as needed (in dialysis as needed). 10/04/20   Jadene Pierini E, PA-C  heparin 1000 unit/mL SOLN injection 2 mLs (2,000 Units total) by Dialysis route as needed (in dialysis as needed). 10/04/20   Jadene Pierini E, PA-C  heparin 1000 unit/mL SOLN injection 1 mL (1,000 Units total) by Dialysis route as needed (in dialysis). 10/04/20   Jadene Pierini E, PA-C  hydrALAZINE (APRESOLINE) 100 MG tablet Take 1 tablet (100 mg total) by mouth every 8 (eight) hours. Patient taking differently: Take 100 mg by mouth 3 (three) times daily. 07/06/20   [name], MD  hydrocortisone (ANUSOL-HC) 25 MG suppository Place 25 mg rectally daily as needed for hemorrhoids.    [provider]  Ibuprofen-Acetaminophen (ADVIL DUAL ACTION PO) Take 3 tablets by mouth 2 (two) times daily as needed (pain).    [provider]  isosorbide mononitrate (IMDUR) 30 MG 24 hr tablet Take 1 tablet (30 mg total) by mouth daily. 02/20/21   Jerline Pain, MD  labetalol (NORMODYNE) 200 MG tablet Take 2 tablets (400 mg total) by mouth 3 (three) times daily. 07/11/21   Bhagat, Crista Luria, PA  lidocaine-prilocaine (EMLA) cream Apply 1 application. topically 2 (two) times daily as needed (pain in fingers).    [provider]  LORazepam (ATIVAN) 1 MG tablet Take 1 mg by mouth every 8 (eight) hours as needed for anxiety. 06/25/20   [provider]  montelukast (SINGULAIR) 10 MG tablet Take 10 mg by mouth daily with supper.     [provider]  multivitamin (RENA-VIT) TABS tablet Take 1 tablet by mouth daily with supper.    [provider]  olmesartan (BENICAR) 40 MG tablet Take 40 mg by mouth every evening. 05/06/19   [provider]  ondansetron (ZOFRAN) 4 MG tablet Take 4 mg by mouth 2 (two) times daily as needed for nausea or vomiting.    [provider]  OVER THE COUNTER MEDICATION Take by mouth daily as needed  (sickness). Elderberry Fruit and flower 1 dropper full by mouth daily as needed for sickness    [provider]  Oxycodone HCl 10 MG TABS Take 10 mg by mouth daily as needed for pain. 01/11/21   [provider]  OXYGEN Inhale 2 L into the lungs as directed.    [provider]  pantoprazole (PROTONIX) 40 MG tablet Take 40 mg by mouth daily. 06/09/20   [provider]  polyethylene glycol powder (GLYCOLAX/MIRALAX) 17 GM/SCOOP powder Mix 17 grams into 8 ounces of liquid and drink by mouth daily. Patient taking differently: Take 17 g by mouth daily as needed (constipation). 03/19/21   Carollee Leitz, MD  predniSONE (DELTASONE) 5 MG tablet Take 4 tablets (20 mg total) by mouth daily with breakfast for 2 days, THEN 1 tablet (5 mg total) daily with breakfast. 06/29/21 12/28/21  Lacinda Axon, MD  Probiotic Product (PROBIOTIC PO) Take 1 capsule by mouth daily.    [provider]  sorbitol 70 % solution Take 30 mLs by mouth See admin instructions. Take 30 ml one- two times daily as needed for constipation 07/10/21   Hunsucker, Bonna Gains, MD  Tiotropium Bromide Monohydrate (SPIRIVA RESPIMAT) 2.5 MCG/ACT AERS Inhale 2 puffs into the lungs 2 (two) times daily as needed (wheezing/SOB). 07/10/21   Hunsucker, Bonna Gains, MD  VENTOLIN HFA 108 (90 Base) MCG/ACT inhaler Inhale 2 puffs into the lungs See admin instructions. Take 2 puffs every 4-6 hours as needed for wheezing and shortness of breath 07/10/21   Hunsucker, Bonna Gains, MD  zolpidem (AMBIEN) 5 MG tablet Take 5 mg by mouth at bedtime as needed for sleep. 02/04/21   [provider]      Allergies    3-methyl-2-benzothiazolinone hydrazone, Banana, Black walnut flavor, Hazelnut (filbert) allergy skin test, Leflunomide and related, Lisinopril, No healthtouch food allergies, Other, Pecan extract allergy skin test, Pecan nut (diagnostic), Trazodone and nefazodone, Adalimumab, Escitalopram oxalate, Ezetimibe,  Secukinumab, Statins, Ferrlecit [na ferric gluc cplx in sucrose], Gabapentin, and Ibuprofen    Review of Systems   Review of Systems  Physical Exam Updated Vital Signs BP (!) 160/112 (BP Location: Right Arm)   Pulse 71   Temp 98.2 F (36.8 C) (Oral)   Resp 16   Ht 1.549 m (5\' 1" )   Wt 64.5 kg   SpO2 100%   BMI 26.87 kg/m  Physical Exam Vitals and nursing note reviewed.  Constitutional:      General: She is not in acute distress.    Appearance: She is well-developed. She is obese.  HENT:     Head: Normocephalic and atraumatic.  Eyes:     Pupils: Pupils are equal, round, and reactive to light.  Cardiovascular:     Rate and Rhythm: Normal rate and regular rhythm.     Heart sounds: Normal heart sounds.  Pulmonary:     Effort: Pulmonary effort is normal.     Breath sounds: Normal breath sounds.  Chest:     Comments: Mild tenderness palpation left anterior chest Well-healing scar Crepitus new signs of trauma Abdominal:     General: Bowel sounds are normal.     Palpations: Abdomen is soft.  Musculoskeletal:     Cervical back: Normal range of motion and neck supple.  Skin:    General: Skin is warm and dry.     Capillary Refill: Capillary refill takes less than 2 seconds.  Neurological:     General: No focal deficit present.     Mental Status: She is alert.    ED Results / Procedures / Treatments   Labs (all labs ordered are listed, but only abnormal results are displayed) Labs Reviewed  BASIC METABOLIC PANEL - Abnormal; Notable for the following components:      Result Value   Creatinine, Ser 4.49 (*)    Calcium 8.1 (*)    GFR, Estimated 11 (*)    All other components within normal limits  CBC - Abnormal; Notable for the following components:   RBC 2.38 (*)    Hemoglobin 7.6 (*)    HCT 25.7 (*)    MCV 108.0 (*)    MCHC 29.6 (*)    RDW 17.4 (*)    All other components within normal limits  MAGNESIUM  CBG MONITORING, ED  TROPONIN I (HIGH SENSITIVITY)   TROPONIN I (HIGH SENSITIVITY)    EKG EKG Interpretation  Date/Time:  Tuesday July 16 2021 10:56:07 EDT Ventricular Rate:  74 PR Interval:  169 QRS Duration: 94 QT Interval:  408 QTC Calculation: 453 R Axis:   31 Text Interpretation: Sinus rhythm Low voltage, extremity leads Non-specific ST-t changes V6 rate has decreased since last tracing Confirmed by Pattricia Boss [phone]) on 07/16/2021 11:01:25 AM  Radiology DG Chest Portable 1 View  Result Date: 07/16/2021 CLINICAL DATA:  Chest pain that radiates to the back. History of CHF. EXAM: PORTABLE CHEST 1 VIEW COMPARISON:  Chest radiograph dated Jun 27, 2021 FINDINGS: The heart is enlarged. Evidence of prior coronary artery bypass grafting. Atherosclerotic calcification of the aortic arch. Prominence of the central pulmonary vessels. No appreciable pleural effusion or frank pulmonary edema. Sternotomy wires are intact. Partially imaged anterior cervical discectomy and fusion hardware. IMPRESSION: 1.  Stable cardiomegaly. 2. Pulmonary vascular congestion without evidence of focal consolidation or pleural effusion. Electronically Signed   By: Keane Police D.O.   On: 07/16/2021 11:32    Procedures Procedures    Medications Ordered in ED Medications  nitroGLYCERIN (NITROSTAT) SL tablet 0.4 mg (0.4 mg Sublingual Given 07/16/21 1129)  nitroGLYCERIN 50 mg in dextrose 5 % 250 mL (0.2 mg/mL) infusion (10 mcg/min Intravenous Infusion Verify 07/16/21 1354)    ED Course/ Medical Decision Making/ A&P Clinical Course as of 07/16/21 1358  Tue Jul 16, 2021  1206 Patient with worsening anemia [DR]  1207 CBC(!) Last hbg 8.4, now 7.6 [DR]  1258 X-Quintessa Simmerman viewed interpreted with cardiomegaly and vascular congestion Radiologist interpretation reviewed and concurs [DR]  1761 Basic metabolic panel(!) Electrolytes are stable with normal potassium Creatinine elevated at 4.49 consistent with patient's renal failure [DR]    Clinical Course User Index [DR] Pattricia Boss, MD                           Medical Decision Making 61 yo female ho cad,esrd, on dialysis presents today with left side chest pain.   DDX- acs, pneumonia other diseases of the lung, dzs of the great vessels including dissection, pe, demand ischemia,  Patient seen and evaluated for chest pain.  Differential diagnosis of serious/life threatening causes of chest pain includes ACS, other diseases of the heart such as myocarditis or pericarditis, lung etiologies such as infection or pneumothorax, diseases of the great vessels such as aortic dissection or AAA, pulmonary embolism, or GI sources such as cholecystitis or other upper abdominal causes. Doubt myocarditis/pericarditis/tamponade based on history, review of ekg and labs Doubt aortic dissection based on history and review of imaging Doubt intrinsic lung causes such as pneumonia or pneumothorax, based on history, physical exam, and studies obtained. Doubt acute GI etiology requiring intervention based on history, physical exam and labs. Concern for acute coronary syndromes including MI.  Initial troponin elevated 15 and patient with known renal failure.  Patient has had ongoing chest pain and is significantly hypertensive Additionally patient's hemoglobin has continued to trend downward with today's hemoglobin 7.5 which may be contributing to symptoms Nitro is being started Cardiology is being consulted regarding chest pain and elevated troponins Nephrology to be consulted as patient does need to be dialyzed today Patient recently discharged from internal medicine service after admission due to [place] to consult internal medicine for readmission  Amount and/or Complexity of Data Reviewed Labs: ordered. Decision-making details documented in ED Course. Radiology: ordered and independent interpretation performed. Decision-making details documented in ED Course. ECG/medicine tests: ordered and independent interpretation  performed.  Decision-making details documented in ED Course. Discussion of management or test interpretation with external provider(s): Discussed with Trish, [facility] and they will see in consultation Discussed with Dr. Alfonse Spruce, on-call for internal medicine and they will see for admission Care discussed with Dr. Arty Baumgartner, on-call for nephrology and they will see for dialysis  Risk Prescription drug management.  Critical Care Total time providing critical care: 45 minutes          Final Clinical Impression(s) / ED Diagnoses Final diagnoses:  Chest pain, unspecified type  Anemia, unspecified type  ESRD (end stage renal disease) ([place])  Hypertension, unspecified type    Rx / DC Orders ED Discharge Orders     None         Pattricia Boss, MD 07/16/21 1358

## 2021-07-17 ENCOUNTER — Observation Stay (HOSPITAL_COMMUNITY): Payer: Medicare PPO

## 2021-07-17 DIAGNOSIS — N186 End stage renal disease: Secondary | ICD-10-CM | POA: Diagnosis not present

## 2021-07-17 DIAGNOSIS — R079 Chest pain, unspecified: Secondary | ICD-10-CM | POA: Diagnosis not present

## 2021-07-17 DIAGNOSIS — K921 Melena: Secondary | ICD-10-CM | POA: Diagnosis not present

## 2021-07-17 LAB — BASIC METABOLIC PANEL
Anion gap: 10 (ref 5–15)
BUN: 11 mg/dL (ref 8–23)
CO2: 29 mmol/L (ref 22–32)
Calcium: 8.4 mg/dL — ABNORMAL LOW (ref 8.9–10.3)
Chloride: 94 mmol/L — ABNORMAL LOW (ref 98–111)
Creatinine, Ser: 2.45 mg/dL — ABNORMAL HIGH (ref 0.44–1.00)
GFR, Estimated: 22 mL/min — ABNORMAL LOW (ref >60)
Glucose, Bld: 80 mg/dL (ref 70–99)
Potassium: 3.3 mmol/L — ABNORMAL LOW (ref 3.5–5.1)
Sodium: 133 mmol/L — ABNORMAL LOW (ref 135–145)

## 2021-07-17 LAB — HEMOGLOBIN AND HEMATOCRIT, BLOOD
HCT: 24.5 % — ABNORMAL LOW (ref 36.0–46.0)
Hemoglobin: 7.7 g/dL — ABNORMAL LOW (ref 12.0–15.0)

## 2021-07-17 LAB — CBC
HCT: 24.1 % — ABNORMAL LOW (ref 36.0–46.0)
Hemoglobin: 7.2 g/dL — ABNORMAL LOW (ref 12.0–15.0)
MCH: 31.7 pg (ref 26.0–34.0)
MCHC: 29.9 g/dL — ABNORMAL LOW (ref 30.0–36.0)
MCV: 106.2 fL — ABNORMAL HIGH (ref 80.0–100.0)
Platelets: 157 10*3/uL (ref 150–400)
RBC: 2.27 MIL/uL — ABNORMAL LOW (ref 3.87–5.11)
RDW: 17.2 % — ABNORMAL HIGH (ref 11.5–15.5)
WBC: 4.5 10*3/uL (ref 4.0–10.5)
nRBC: 0 % (ref 0.0–0.2)

## 2021-07-17 LAB — PREPARE RBC (CROSSMATCH)

## 2021-07-17 LAB — HEPATITIS B SURFACE ANTIBODY, QUANTITATIVE: Hep B S AB Quant (Post): 10 m[IU]/mL (ref >9.9)

## 2021-07-17 MED ORDER — DIPHENHYDRAMINE HCL 25 MG PO CAPS
25.0000 mg | ORAL_CAPSULE | Freq: Four times a day (QID) | ORAL | Status: DC | PRN
  Administered 2021-07-17 – 2021-07-19 (×4): 25 mg via ORAL
  Filled 2021-07-17 (×5): qty 1

## 2021-07-17 MED ORDER — HYDROCORTISONE (PERIANAL) 2.5 % EX CREA
TOPICAL_CREAM | Freq: Two times a day (BID) | CUTANEOUS | Status: DC
  Filled 2021-07-17: qty 28.35

## 2021-07-17 MED ORDER — ZOLPIDEM TARTRATE 5 MG PO TABS
5.0000 mg | ORAL_TABLET | Freq: Every evening | ORAL | Status: DC | PRN
  Administered 2021-07-17 – 2021-07-22 (×5): 5 mg via ORAL
  Filled 2021-07-17 (×6): qty 1

## 2021-07-17 MED ORDER — AMLODIPINE BESYLATE 10 MG PO TABS
10.0000 mg | ORAL_TABLET | Freq: Every day | ORAL | Status: DC
  Administered 2021-07-18 – 2021-07-23 (×6): 10 mg via ORAL
  Filled 2021-07-17 (×6): qty 1

## 2021-07-17 MED ORDER — IRBESARTAN 300 MG PO TABS
300.0000 mg | ORAL_TABLET | Freq: Every day | ORAL | Status: DC
  Administered 2021-07-17 – 2021-07-23 (×7): 300 mg via ORAL
  Filled 2021-07-17 (×8): qty 1

## 2021-07-17 MED ORDER — UMECLIDINIUM BROMIDE 62.5 MCG/ACT IN AEPB
1.0000 | INHALATION_SPRAY | Freq: Every day | RESPIRATORY_TRACT | Status: DC
  Administered 2021-07-18: 1 via RESPIRATORY_TRACT
  Filled 2021-07-17: qty 7

## 2021-07-17 MED ORDER — AMLODIPINE BESYLATE 5 MG PO TABS
5.0000 mg | ORAL_TABLET | Freq: Once | ORAL | Status: AC
  Administered 2021-07-17: 5 mg via ORAL
  Filled 2021-07-17: qty 1

## 2021-07-17 MED ORDER — HYDROCORTISONE (PERIANAL) 2.5 % EX CREA
TOPICAL_CREAM | Freq: Three times a day (TID) | CUTANEOUS | Status: DC
  Filled 2021-07-17: qty 28.35

## 2021-07-17 MED ORDER — OXYCODONE-ACETAMINOPHEN 5-325 MG PO TABS
1.0000 | ORAL_TABLET | Freq: Once | ORAL | Status: AC
  Administered 2021-07-17: 1 via ORAL
  Filled 2021-07-17: qty 1

## 2021-07-17 MED ORDER — ONDANSETRON 4 MG PO TBDP
4.0000 mg | ORAL_TABLET | Freq: Three times a day (TID) | ORAL | Status: DC | PRN
Start: 2021-07-17 — End: 2021-07-23
  Filled 2021-07-17: qty 1

## 2021-07-17 MED ORDER — SODIUM CHLORIDE 0.9% IV SOLUTION: Freq: Once | INTRAVENOUS | Status: AC

## 2021-07-17 NOTE — Progress Notes (Addendum)
HD#0 Subjective:  Overnight Events: NAEON. 1.5 kg weight kg noted since yesterday. BP 206/81. Received IV labetalol 10 mg and now 168/69.   Patient seen and assessed at bedside. Denies any further chest pain. Denies dyspnea or chills. Does report having hematochezia and melena since being hospitalized but denies any abdominal pain or straining. Patient amenable to staying in hospital for further workup of acute anemia and concern of GI bleed.  Objective:  Vital signs in last 24 hours: Vitals:   07/17/21 0850 07/17/21 1159 07/17/21 1414 07/17/21 1434  BP: (!) 179/71 (!) 143/65 (!) 154/66 (!) 147/74  Pulse:  72 73 72  Resp:  18 18 18   Temp:  99 F (37.2 C) 98.7 F (37.1 C) 98.6 F (37 C)  TempSrc:  Oral Oral Oral  SpO2:  96% 98% 98%  Weight:      Height:       Supplemental O2: Nasal Cannula SpO2: 98 % O2 Flow Rate (L/min): 3 L/min   Physical Exam:  Physical Exam Constitutional:      General: She is not in acute distress.    Appearance: She is normal weight. She is not ill-appearing.  HENT:     Head: Normocephalic and atraumatic.  Cardiovascular:     Rate and Rhythm: Normal rate and regular rhythm.     Heart sounds: Murmur heard.     Comments: Diastolic murmur Pulmonary:     Effort: Pulmonary effort is normal. No tachypnea or respiratory distress.     Comments: Mild expiratory wheeze Skin:    General: Skin is warm and dry.     Capillary Refill: Capillary refill takes less than 2 seconds.  Neurological:     General: No focal deficit present.     Mental Status: She is alert and oriented to person, place, and time.  Psychiatric:        Mood and Affect: Mood normal.        Behavior: Behavior normal.     Filed Weights   07/16/21 2335 07/17/21 0316 07/17/21 0625  Weight: 71.9 kg 69.9 kg 65.5 kg     Intake/Output Summary (Last 24 hours) at 07/17/2021 1449 Last data filed at 07/17/2021 1331 Gross per 24 hour  Intake 252.15 ml  Output 2000 ml  Net -1747.85 ml    Net IO Since Admission: -1,745.78 mL [07/17/21 1449]  Pertinent Labs:    Latest Ref Rng & Units 07/17/2021    5:00 AM 07/16/2021   10:59 AM 06/28/2021    3:46 AM  CBC  WBC 4.0 - 10.5 K/uL 4.5   4.8   7.8    Hemoglobin 12.0 - 15.0 g/dL 7.2   7.6   8.5    Hematocrit 36.0 - 46.0 % 24.1   25.7   27.5    Platelets 150 - 400 K/uL 157   152   154         Latest Ref Rng & Units 07/17/2021    5:00 AM 07/16/2021   10:59 AM 06/28/2021    9:05 AM  CMP  Glucose 70 - 99 mg/dL 80   75   108    BUN 8 - 23 mg/dL 11   22   43    Creatinine 0.44 - 1.00 mg/dL 2.45   4.49   7.59    Sodium 135 - 145 mmol/L 133   139   133    Potassium 3.5 - 5.1 mmol/L 3.3   4.0   4.9  Chloride 98 - 111 mmol/L 94   100   95    CO2 22 - 32 mmol/L 29   28   25     Calcium 8.9 - 10.3 mg/dL 8.4   8.1   8.2      Imaging: ECHOCARDIOGRAM COMPLETE  Result Date: 07/17/2021    ECHOCARDIOGRAM REPORT   Patient Name:   Jodi Bell Date of Exam: 07/17/2021 Medical Rec #:  [id number]        Height:       63.0 in Accession #:    [id number]       Weight:       144.4 lb Date of Birth:  Dec 08, 1960         BSA:          1.684 m Patient Age:    61 years         BP:           179/71 mmHg Patient Gender: F                HR:           71 bpm. Exam Location:  Inpatient Procedure: 2D Echo, Cardiac Doppler and Color Doppler Indications:     Chest pain  History:         Patient has prior history of Echocardiogram examinations, most                  recent 06/26/2021. CHF, CAD; Risk Factors:Hypertension, Diabetes                  and Sleep Apnea.  Sonographer:     Jefferey Pica Referring Phys:  [place] Diagnosing Phys: Gwyndolyn Kaufman MD IMPRESSIONS  1. Left ventricular ejection fraction, by estimation, is 65 to 70%. The left ventricle has normal function. The left ventricle has no regional wall motion abnormalities. There is severe concentric left ventricular hypertrophy. Left ventricular diastolic  parameters are consistent with Grade II  diastolic dysfunction (pseudonormalization). Elevated left atrial pressure.  2. Right ventricular systolic function is mildly reduced. The right ventricular size is normal. There is moderately elevated pulmonary artery systolic pressure. The estimated right ventricular systolic pressure is 33.2 mmHg which is higher than previous  TTE on 06/26/21 at 54mmHg. May be related to volume overload, however, PE is on the differential. Recommend clinical correlation.  3. Left atrial size was severely dilated.  4. Right atrial size was mildly dilated.  5. The mitral valve is degenerative. Mild mitral valve regurgitation. Severe mitral annular calcification.  6. Tricuspid valve regurgitation is mild to moderate.  7. The aortic valve is tricuspid. There is moderate calcification of the aortic valve. There is moderate thickening of the aortic valve. Aortic valve regurgitation is not visualized. Moderate aortic valve stenosis. Aortic valve mean gradient measures 36.0 mmHg. Aortic valve Vmax measures 4.11 m/s. AVA by continuity 1.4cm2, DI 0.74m/s. Suspect elevated mean gradient and Vmax in the setting of high output with LVOT VTI 40.4cm.  8. Aortic dilatation noted. There is borderline dilatation of the ascending aorta, measuring 39 mm.  9. The inferior vena cava is dilated in size with <50% respiratory variability, suggesting right atrial pressure of 15 mmHg. Comparison(s): Compared to prior TTE on 06/26/21, the PASP is now 55.64mmHg from 34.44mmHg and the RV is mildly hypokinetic. Elevated PASP may be related to volume overload, however, PE is on the differential. Otherwise, there continues to be moderate AS with similar gradients. FINDINGS  Left Ventricle: Left ventricular ejection fraction, by estimation, is 65 to 70%. The left ventricle has normal function. The left ventricle has no regional wall motion abnormalities. The left ventricular internal cavity size was normal in size. There is  severe concentric left ventricular  hypertrophy. Left ventricular diastolic parameters are consistent with Grade II diastolic dysfunction (pseudonormalization). Elevated left atrial pressure. Right Ventricle: The right ventricular size is normal. No increase in right ventricular wall thickness. Right ventricular systolic function is mildly reduced. There is moderately elevated pulmonary artery systolic pressure. The tricuspid regurgitant velocity is 3.17 m/s, and with an assumed right atrial pressure of 15 mmHg, the estimated right ventricular systolic pressure is 12.2 mmHg. Left Atrium: Left atrial size was severely dilated. Right Atrium: Right atrial size was mildly dilated. Pericardium: There is no evidence of pericardial effusion. Mitral Valve: The mitral valve is degenerative in appearance. Severe mitral annular calcification. Mild mitral valve regurgitation. Tricuspid Valve: The tricuspid valve is normal in structure. Tricuspid valve regurgitation is mild to moderate. Aortic Valve: The aortic valve is tricuspid. There is moderate calcification of the aortic valve. There is moderate thickening of the aortic valve. Aortic valve regurgitation is not visualized. Moderate aortic stenosis is present. Aortic valve mean gradient measures 36.0 mmHg. Aortic valve peak gradient measures 67.6 mmHg. Aortic valve area, by VTI measures 1.33 cm. Pulmonic Valve: The pulmonic valve was normal in structure. Pulmonic valve regurgitation is trivial. Aorta: Aortic dilatation noted. There is borderline dilatation of the ascending aorta, measuring 39 mm. Venous: The inferior vena cava is dilated in size with less than 50% respiratory variability, suggesting right atrial pressure of 15 mmHg. IAS/Shunts: The atrial septum is grossly normal.  LEFT VENTRICLE PLAX 2D LVIDd:         4.50 cm   Diastology LVIDs:         2.80 cm   LV e' medial:    5.06 cm/s LV PW:         1.80 cm   LV E/e' medial:  35.2 LV IVS:        1.50 cm   LV e' lateral:   8.03 cm/s LVOT diam:     1.90 cm    LV E/e' lateral: 22.2 LV SV:         115 LV SV Index:   68 LVOT Area:     2.84 cm  RIGHT VENTRICLE            IVC RV Basal diam:  3.10 cm    IVC diam: 2.60 cm RV S prime:     5.12 cm/s TAPSE (M-mode): 1.2 cm LEFT ATRIUM              Index        RIGHT ATRIUM           Index LA diam:        4.80 cm  2.85 cm/m   RA Area:     19.50 cm LA Vol (A2C):   112.0 ml 66.52 ml/m  RA Volume:   53.70 ml  31.89 ml/m LA Vol (A4C):   103.0 ml 61.18 ml/m LA Biplane Vol: 109.0 ml 64.74 ml/m  AORTIC VALVE                     PULMONIC VALVE AV Area (Vmax):    1.44 cm      PV Vmax:       0.80 m/s AV Area (Vmean):   1.55 cm  PV Peak grad:  2.6 mmHg AV Area (VTI):     1.33 cm AV Vmax:           411.00 cm/s AV Vmean:          262.000 cm/s AV VTI:            0.858 m AV Peak Grad:      67.6 mmHg AV Mean Grad:      36.0 mmHg LVOT Vmax:         209.00 cm/s LVOT Vmean:        143.000 cm/s LVOT VTI:          0.404 m LVOT/AV VTI ratio: 0.47  AORTA Ao Root diam: 3.10 cm Ao Asc diam:  3.90 cm MITRAL VALVE                TRICUSPID VALVE MV Area (PHT): 4.41 cm     TR Peak grad:   40.2 mmHg MV Decel Time: 172 msec     TR Vmax:        317.00 cm/s MR Peak grad: 71.2 mmHg MR Mean grad: 6.0 mmHg      SHUNTS MR Vmax:      422.00 cm/s   Systemic VTI:  0.40 m MR Vmean:     114.0 cm/s    Systemic Diam: 1.90 cm MV E velocity: 178.00 cm/s MV A velocity: 113.00 cm/s MV E/A ratio:  1.58 Gwyndolyn Kaufman MD Electronically signed by Gwyndolyn Kaufman MD Signature Date/Time: 07/17/2021/10:37:52 AM    Final (Updated)     Assessment/Plan:   Principal Problem:   Chest pain Active Problems:   Essential hypertension   Hematochezia   ESRD (end stage renal disease) (HCC)   Patient Summary: Jodi Bell is a 61 y.o. female with past medical history of hypertension, type 2 diabetes mellitus, COPD on 3 L supplemental oxygen at baseline, ESRD on HD MWF initally presenting with chest pain (now resolved) and continues to be hospitalized for acute  anemia workup.  Hypertensive Emergency BP 168/69 s/p IV labetalol and AM antihypertensives. Will continue to monitor. -Extra HD session today to manage BP and hypervolemia -Labetalol 400 mg TID -Amlodipine 5 mg QD -Hydralazine 100 mg TID -Clonidine 0.1 mg TID -Irbesartan 300 mg QD   Non cardiac chest pain  CAD History of NSTEMI with CABG x 2 in August 2022 Denies further chest pain. -Cardiology outpatient follow-up for stress test -antihypertensives as above   Internal hemorrhoids Diverticulosis Melena Hematochezia Acute on Chronic macrocytic Anemia Received 1 u prbc yesterday. Hgb 7.2>7.7>7.5. Unclear whether this is entirely due to hemorrhoids given melena.  -Holding heparin for DVT prophylaxis. SCDs for now -GI consulted, greatly appreciate recs -Capsule endoscopy tomorrow, NPO after midnight sip with meds   Pulmonary Vascular Congestion End Stage Renal Disease on MWF Hemodialysis ESRD MWF. Appears hypervolemic per weight and increased oxygen requirement. -Nephrology following, appreciate recommendations. -HD per nephro recs (planned extra HD today)   Chronic Hypoxemic Respiratory Failure COPD OSA (CPAP noncompliant) Patient with longstanding COPD on 2-3 L O2 at home -PRN albuterol -Home montelukast -Home tiotropium -PT/OT eval and treat   History of Type 2 Diabetes Mellitus Most recent A1C of 5.2 in August 2022. Not on diabetes medications at home.   GERD History of bleeding esophageal ulcer History of Esophagitis -Pantoprazole 40 mg qd   Gout -Continue home Allopurinol 100 mg.    Psoriatic Arthritis -Continue home Prednisone 5 mg   Insomnia -Continue home ambien qhs prn  Diet: Renal VTE: SCD IVF: None Code: Full   Prior to Admission Living Arrangement: Home Anticipated Discharge Location: TBD Barriers to Discharge: Medical Stability Dispo: Anticipated discharge in approximately 1-2 day(s).   [name], MD 07/17/2021, 2:49 PM Pager:  [phone]  Please contact the on call pager after 5 pm and on weekends at [phone].

## 2021-07-17 NOTE — Progress Notes (Addendum)
Patient deferred CHG bath on admission. Patient deferred standing weight on admission.  Patient deferred admission assessment, exception to lung and heart auscultation as well as pulses. Noted in flowsheets.

## 2021-07-17 NOTE — ED Notes (Signed)
ED TO INPATIENT HANDOFF REPORT  ED Nurse Name and Phone #: Andi Hence, RN  S Name/Age/Gender Jodi Bell 61 y.o. female Room/Bed: 039C/039C  Code Status   Code Status: Full Code  Home/SNF/Other Home Patient oriented to: self, place, time, and situation Is this baseline? Yes   Triage Complete: Triage complete  Chief Complaint Chest pain [R07.9]  Triage Note Patient complains of chest pain this am that radiates to back.  Denies all other symptoms.  Patient took 324mg  ASA prior to EMS arrival. Pain was 7/10 got 2 nitro from EMS now pain is 4/10 Dialysis patient MWF had dialysis yesterday and normal chair time.  Hx CHF had valves replaced last year.    Allergies Allergies  Allergen Reactions   3-Methyl-2-Benzothiazolinone Hydrazone Other (See Comments)    Muscle weakness muscle cramping in legs Other reaction(s): Myalgias (Muscle Pain)   Banana Anaphylaxis, Swelling and Other (See Comments)    TONGUE AND MOUTH SWELLING   Black Walnut Flavor Anaphylaxis and Itching    Walnuts   Hazelnut ([place]) Allergy Skin Test Anaphylaxis    Hazelnuts   Leflunomide And Related Other (See Comments)    Severe Headache   Lisinopril Swelling    Angioedema  Swelling of the face   No [employer] Food Allergies Anaphylaxis    Spicy Mustard 4.7.2021 Pt reports that she eats Regular Yellow Mustard Swelling and itching of tongue   Other Other (See Comments), Anaphylaxis and Swelling    Cosentyx= angioedema  Other reaction(s): Facial Edema (intolerance) Mouth itches and tongue swelling Hazel nuts and pecans also Walnuts Walnuts Renal failure Other reaction(s): Other Serotonin Syndrome  Serotonin syndrome  Tremors Other reaction(s): Other Tremors Muscle weakness muscle cramping in legs Other reaction(s): Myalgias (Muscle Pain) Mouth itches and tongue swelling Hazel nuts and pecans also Walnuts    Pecan Extract Allergy Skin Test Anaphylaxis and Itching    Pecans     Pecan Nut (Diagnostic) Anaphylaxis and Itching    Pecans   Trazodone And Nefazodone Other (See Comments)    Serotonin Syndrome  Tremors   Adalimumab Other (See Comments)    Blisters on abdomen =humira   Escitalopram Oxalate Other (See Comments)    Hand problems - Serotonin Syndrome     Ezetimibe Other (See Comments)    myalgias cramps    Secukinumab Swelling    Cosentyx = angiodema    Statins Other (See Comments)    Leg pains and weakness   Ferrlecit [Na Ferric Gluc Cplx In Sucrose]     Not reaction given from HD   Gabapentin Other (See Comments)    tremors   Ibuprofen Other (See Comments)    Renal Problems    Level of Care/Admitting Diagnosis ED Disposition     ED Disposition  Admit   Condition  --   Comment  Hospital Area: [facility] [[id number]]  Level of Care: Telemetry Cardiac [[id number]]  May place patient in observation at [facility] or [place] if equivalent level of care is available:: Yes  Covid Evaluation: Asymptomatic - no recent exposure (last 10 days) testing not required  Diagnosis: Chest pain [[id number]]  Admitting Physician: [name] [[id number]]  Attending Physician: Velna Ochs [[id number]]          B Medical/Surgery History Past Medical History:  Diagnosis Date   Anemia of chronic disease    Anxiety    Arthritis    oa and psoriatic ra   Asthma    Back pain, chronic  lower back   Candida infection finished tx 2 days ago   Chronic insomnia    COPD (chronic obstructive pulmonary disease) (HCC)    Coronary artery calcification seen on CT scan    Diabetes mellitus    type 2   Eczema    ESRD on dialysis [facility])    Essential hypertension    GERD (gastroesophageal reflux disease)    Gout    History of hiatal hernia    Hypoalbuminemia    Hyponatremia    Mild aortic stenosis    Mitral regurgitation    Mitral stenosis    Obesity    PAD (peripheral artery disease) ([place])    a. externial iliac calcification seen on CT  04/2020.   Pneumonia few yrs ago x 2   Sleep apnea    Tobacco abuse    Upper GI bleed 05/2019   Past Surgical History:  Procedure Laterality Date   BACK SURGERY  2016   lower back fusion with cage   BIOPSY  09/23/2017   Procedure: BIOPSY;  Surgeon: Wonda Horner, MD;  Location: [facility];  Service: Endoscopy;;   BIOPSY  05/19/2019   Procedure: BIOPSY;  Surgeon: Otis Brace, MD;  Location: [place];  Service: Gastroenterology;;   CESAREAN SECTION  1990   x 1    COLONOSCOPY WITH PROPOFOL N/A 09/23/2017   Procedure: COLONOSCOPY WITH PROPOFOL Hemostatic clips placed;  Surgeon: Wonda Horner, MD;  [facility];  Service: Endoscopy;  Laterality: N/A;   COLONOSCOPY WITH PROPOFOL N/A 10/02/2020   Procedure: COLONOSCOPY WITH PROPOFOL;  Surgeon: Arta Silence, MD;  Location: [place];  Service: Endoscopy;  Laterality: N/A;   CORONARY ARTERY BYPASS GRAFT N/A 09/13/2020   Procedure: CORONARY ARTERY BYPASS GRAFTING (CABG), ON PUMP, TIMES TWO, USING LEFT INTERNAL MAMMARY ARTERY AND RIGHT ENDOSCOPICALLY HARVESTED GREATER SAPHENOUS VEIN;  Surgeon: Gaye Pollack, MD;  Location: [place];  Service: Open Heart Surgery;  Laterality: N/A;   DILATION AND CURETTAGE OF UTERUS  1988   ESOPHAGOGASTRODUODENOSCOPY N/A 09/22/2020   Procedure: ESOPHAGOGASTRODUODENOSCOPY (EGD);  Surgeon: Arta Silence, MD;  Location: [facility];  Service: Endoscopy;  Laterality: N/A;   ESOPHAGOGASTRODUODENOSCOPY (EGD) WITH PROPOFOL N/A 09/23/2017   Procedure: ESOPHAGOGASTRODUODENOSCOPY (EGD) WITH PROPOFOL;  Surgeon: Wonda Horner, MD;  [facility];  Service: Endoscopy;  Laterality: N/A;   ESOPHAGOGASTRODUODENOSCOPY (EGD) WITH PROPOFOL N/A 05/19/2019   Procedure: ESOPHAGOGASTRODUODENOSCOPY (EGD) WITH PROPOFOL;  Surgeon: Otis Brace, MD;  Location: [place];  Service: Gastroenterology;  Laterality: N/A;   ESOPHAGOGASTRODUODENOSCOPY (EGD) WITH PROPOFOL N/A 10/02/2020   Procedure:  ESOPHAGOGASTRODUODENOSCOPY (EGD) WITH PROPOFOL;  Surgeon: Arta Silence, MD;  Location: [place];  Service: Endoscopy;  Laterality: N/A;   GIVENS CAPSULE STUDY N/A 05/19/2019   Procedure: GIVENS CAPSULE STUDY;  Surgeon: Otis Brace, MD;  Location: [place];  Service: Gastroenterology;  Laterality: N/A;   HEMOSTASIS CLIP PLACEMENT  09/22/2020   Procedure: HEMOSTASIS CLIP PLACEMENT;  Surgeon: Arta Silence, MD;  Location: [place];  Service: Endoscopy;;   HEMOSTASIS CONTROL  09/22/2020   Procedure: HEMOSTASIS CONTROL;  Surgeon: Arta Silence, MD;  Location: [place];  Service: Endoscopy;;   HOT HEMOSTASIS N/A 05/19/2019   Procedure: HOT HEMOSTASIS (ARGON PLASMA COAGULATION/BICAP);  Surgeon: Otis Brace, MD;  Location: [facility] ENDOSCOPY;  Service: Gastroenterology;  Laterality: N/A;   LEFT HEART CATH AND CORONARY ANGIOGRAPHY N/A 09/10/2020   Procedure: LEFT HEART CATH AND CORONARY ANGIOGRAPHY;  Surgeon: Nelva Bush, MD;  Location: [place] [facility];  Service: Cardiovascular;  Laterality: N/A;   PARTIAL KNEE ARTHROPLASTY Left 11/12/2017   Procedure: left unicompartmental arthroplasty-medial;  Surgeon: Paralee Cancel, MD;  [facility];  Service: Orthopedics;  Laterality: Left;  71min   SUBMUCOSAL INJECTION  09/23/2017   Procedure: SUBMUCOSAL INJECTION;  Surgeon: Wonda Horner, MD;  [facility];  Service: Endoscopy;;  in colon   TEE WITHOUT CARDIOVERSION N/A 09/13/2020   Procedure: TRANSESOPHAGEAL ECHOCARDIOGRAM (TEE);  Surgeon: Gaye Pollack, MD;  Location: [place];  Service: Open Heart Surgery;  Laterality: N/A;   TUBAL LIGATION  1990     A IV Location/Drains/Wounds Patient Lines/Drains/Airways Status     Active Line/Drains/Airways     Name Placement date Placement time Site Days   Peripheral IV 07/16/21 20 G Anterior;Distal;Right Forearm 07/16/21  1053  Forearm  1   Fistula / Graft Left Upper arm 05/17/18  0800  Upper arm  1157             Intake/Output Last 24 hours  Intake/Output Summary (Last 24 hours) at 07/17/2021 0516 Last data filed at 07/16/2021 1739 Gross per 24 hour  Intake 14.22 ml  Output --  Net 14.22 ml    Labs/Imaging Results for orders placed or performed during the hospital encounter of 07/16/21 (from the past 48 hour(s))  Basic metabolic panel     Status: Abnormal   Collection Time: 07/16/21 10:59 AM  Result Value Ref Range   Sodium 139 135 - 145 mmol/L   Potassium 4.0 3.5 - 5.1 mmol/L   Chloride 100 98 - 111 mmol/L   CO2 28 22 - 32 mmol/L   Glucose, Bld 75 70 - 99 mg/dL    Comment: Glucose reference range applies only to samples taken after fasting for at least 8 hours.   BUN 22 8 - 23 mg/dL   Creatinine, Ser 4.49 (H) 0.44 - 1.00 mg/dL   Calcium 8.1 (L) 8.9 - 10.3 mg/dL   GFR, Estimated 11 (L) >60 mL/min    Comment: (NOTE) Calculated using the CKD-EPI Creatinine Equation (2021)    Anion gap 11 5 - 15    Comment: Performed at [facility] [address], [place], [place] [address]  Troponin I (High Sensitivity)     Status: None   Collection Time: 07/16/21 10:59 AM  Result Value Ref Range   Troponin I (High Sensitivity) 15 <18 ng/L    Comment: (NOTE) Elevated high sensitivity troponin I (hsTnI) values and significant  changes across serial measurements may suggest ACS but many other  chronic and acute conditions are known to elevate hsTnI results.  Refer to the "Links" section for chest pain algorithms and additional  guidance. Performed at [facility], [place] [address] [place], [place] [address]   CBC     Status: Abnormal   Collection Time: 07/16/21 10:59 AM  Result Value Ref Range   WBC 4.8 4.0 - 10.5 K/uL   RBC 2.38 (L) 3.87 - 5.11 MIL/uL   Hemoglobin 7.6 (L) 12.0 - 15.0 g/dL   HCT 25.7 (L) 36.0 - 46.0 %   MCV 108.0 (H) 80.0 - 100.0 fL   MCH 31.9 26.0 - 34.0 pg   MCHC 29.6 (L) 30.0 - 36.0 g/dL   RDW 17.4 (H) 11.5 - 15.5 %   Platelets 152 150 - 400 K/uL   nRBC 0.0  0.0 - 0.2 %    Comment: Performed at [facility] [address], [place], [place] [address]  Magnesium  Status: None   Collection Time: 07/16/21 10:59 AM  Result Value Ref Range   Magnesium 1.7 1.7 - 2.4 mg/dL    Comment: Performed at [facility] [address] [place], [place] [address]  CBG monitoring, ED     Status: None   Collection Time: 07/16/21 11:23 AM  Result Value Ref Range   Glucose-Capillary 73 70 - 99 mg/dL    Comment: Glucose reference range applies only to samples taken after fasting for at least 8 hours.  Troponin I (High Sensitivity)     Status: None   Collection Time: 07/16/21  1:16 PM  Result Value Ref Range   Troponin I (High Sensitivity) 17 <18 ng/L    Comment: (NOTE) Elevated high sensitivity troponin I (hsTnI) values and significant  changes across serial measurements may suggest ACS but many other  chronic and acute conditions are known to elevate hsTnI results.  Refer to the "Links" section for chest pain algorithms and additional  guidance. Performed at [facility], [place] [address] [place], [place] [address]   Lipid panel     Status: None   Collection Time: 07/16/21  1:16 PM  Result Value Ref Range   Cholesterol 131 0 - 200 mg/dL   Triglycerides 34 <150 mg/dL   HDL 55 >40 mg/dL   Total CHOL/HDL Ratio 2.4 RATIO   VLDL 7 0 - 40 mg/dL   LDL Cholesterol 69 0 - 99 mg/dL    Comment:        Total Cholesterol/HDL:CHD Risk Coronary Heart Disease Risk Table                     Men   Women  1/2 Average Risk   3.4   3.3  Average Risk       5.0   4.4  2 X Average Risk   9.6   7.1  3 X Average Risk  23.4   11.0        Use the calculated Patient Ratio above and the CHD Risk Table to determine the patient's CHD Risk.        ATP III CLASSIFICATION (LDL):  <100     mg/dL   Optimal  100-129  mg/dL   Near or Above                    Optimal  130-159  mg/dL   Borderline  160-189  mg/dL   High  >190     mg/dL   Very High Performed  at [facility] [address] [place], [place] [address]   Hepatitis B surface antigen     Status: None   Collection Time: 07/16/21  4:08 PM  Result Value Ref Range   Hepatitis B Surface Ag NON REACTIVE NON REACTIVE    Comment: Performed at [facility] [address] [place], [place] [address]  Hepatitis B surface antibody     Status: Abnormal   Collection Time: 07/16/21  4:08 PM  Result Value Ref Range   Hep B S Ab Reactive (A) NON REACTIVE    Comment: (NOTE) Consistent with immunity, greater than 9.9 mIU/mL.  Performed at [facility], [place] [address], [place], [place] [address]   Hepatitis B core antibody, total     Status: None   Collection Time: 07/16/21  4:08 PM  Result Value Ref Range   Hep B Core Total Ab NON REACTIVE NON REACTIVE  Comment: Performed at [facility], [place] [address] [place], [place] [address]  HIV Antibody (routine testing w rflx)     Status: None   Collection Time: 07/16/21  4:08 PM  Result Value Ref Range   HIV Screen 4th Generation wRfx Non Reactive Non Reactive    Comment: Performed at [facility], [place] [address] [place], [place] [address]  Folate     Status: None   Collection Time: 07/16/21  4:08 PM  Result Value Ref Range   Folate 36.4 >5.9 ng/mL    Comment: RESULT CONFIRMED BY AUTOMATED DILUTION Performed at [facility], [place] [address], [place], [place] [address]   Vitamin B12     Status: None   Collection Time: 07/16/21  4:08 PM  Result Value Ref Range   Vitamin B-12 512 180 - 914 pg/mL    Comment: (NOTE) This assay is not validated for testing neonatal or myeloproliferative syndrome specimens for Vitamin B12 levels. Performed at [facility], [place] [address] [place], [place] [address]   Iron and TIBC     Status: Abnormal   Collection Time: 07/16/21  4:08 PM  Result Value Ref Range   Iron 54 28 - 170 ug/dL   TIBC 182 (L) 250 - 450 ug/dL   Saturation Ratios 30 10.4 - 31.8 %   UIBC 128  ug/dL    Comment: Performed at [facility], [place] [address], [place], [place] [address]  Reticulocytes     Status: Abnormal   Collection Time: 07/16/21  4:08 PM  Result Value Ref Range   Retic Ct Pct 2.8 0.4 - 3.1 %   RBC. 2.30 (L) 3.87 - 5.11 MIL/uL   Retic Count, Absolute 63.7 19.0 - 186.0 K/uL   Immature Retic Fract 15.5 2.3 - 15.9 %    Comment: Performed at [facility] [address] [place], [place] [address]  Hepatitis C antibody     Status: None   Collection Time: 07/16/21  4:08 PM  Result Value Ref Range   HCV Ab NON REACTIVE NON REACTIVE    Comment: (NOTE) Nonreactive HCV antibody screen is consistent with no HCV infections,  unless recent infection is suspected or other evidence exists to indicate HCV infection.  Performed at [facility], [place] [address], [place],  [address]    DG Chest Portable 1 View  Result Date: 07/16/2021 CLINICAL DATA:  Chest pain that radiates to the back. History of CHF. EXAM: PORTABLE CHEST 1 VIEW COMPARISON:  Chest radiograph dated Jun 27, 2021 FINDINGS: The heart is enlarged. Evidence of prior coronary artery bypass grafting. Atherosclerotic calcification of the aortic arch. Prominence of the central pulmonary vessels. No appreciable pleural effusion or frank pulmonary edema. Sternotomy wires are intact. Partially imaged anterior cervical discectomy and fusion hardware. IMPRESSION: 1.  Stable cardiomegaly. 2. Pulmonary vascular congestion without evidence of focal consolidation or pleural effusion. Electronically Signed   By: Keane Police D.O.   On: 07/16/2021 11:32    Pending Labs Unresulted Labs (From admission, onward)     Start     Ordered   07/17/21 0500  CBC  Tomorrow morning,   R        07/16/21 1521   07/17/21 4481  Basic metabolic panel  Tomorrow morning,   R        07/16/21 1521   07/16/21 1513  Hepatitis B surface antibody,quantitative  (New Admission Hemo Labs (Hepatitis B))  Once,   URGENT         07/16/21 1513   Signed and Held  Renal function panel  Once,   R        Signed and Held   Signed and Held  CBC  Once,   R        Signed and Held            Vitals/Pain Today's Vitals   07/17/21 0337 07/17/21 0430 07/17/21 0435 07/17/21 0453  BP: (!) 194/102 (!) 189/73  (!) 180/69  Pulse: 76 74    Resp:      Temp:   98.7 F (37.1 C)   TempSrc:   Oral   SpO2:  98%    Weight:      Height:      PainSc:        Isolation Precautions No active isolations  Medications Medications  nitroGLYCERIN 50 mg in dextrose 5 % 250 mL (0.2 mg/mL) infusion (0 mcg/min Intravenous Stopped 07/16/21 1736)  allopurinol (ZYLOPRIM) tablet 100 mg (100 mg Oral Given 07/16/21 1701)  pantoprazole (PROTONIX) EC tablet 40 mg (40 mg Oral Given 07/16/21 1701)  albuterol (PROVENTIL) (2.5 MG/3ML) 0.083% nebulizer solution 2.5 mg (has no administration in time range)  montelukast (SINGULAIR) tablet 10 mg (10 mg Oral Given 07/16/21 1701)  umeclidinium bromide (INCRUSE ELLIPTA) 62.5 MCG/ACT 1 puff (has no administration in time range)  acetaminophen (TYLENOL) tablet 650 mg (650 mg Oral Given 07/16/21 2002)    Or  acetaminophen (TYLENOL) suppository 650 mg ( Rectal See Alternative 07/16/21 2002)  polyethylene glycol (MIRALAX / GLYCOLAX) packet 17 g (has no administration in time range)  amLODipine (NORVASC) tablet 5 mg (5 mg Oral Given 07/17/21 0454)  predniSONE (DELTASONE) tablet 5 mg (has no administration in time range)  Chlorhexidine Gluconate Cloth 2 % PADS 6 each (has no administration in time range)  cloNIDine (CATAPRES) tablet 0.1 mg (0.1 mg Oral Given 07/17/21 0118)  hydrALAZINE (APRESOLINE) tablet 100 mg (100 mg Oral Given 07/17/21 0119)  labetalol (NORMODYNE) tablet 400 mg (400 mg Oral Given 07/17/21 0337)  isosorbide mononitrate (IMDUR) 24 hr tablet 60 mg (60 mg Oral Given 07/16/21 1701)  heparin injection 5,000 Units (0 Units Subcutaneous Hold 07/17/21 0442)  LORazepam (ATIVAN) tablet 1 mg (1 mg Oral Given 07/17/21 0022)   irbesartan (AVAPRO) tablet 300 mg (has no administration in time range)    Mobility walks with person assist Low fall risk   Focused Assessments Cardiac Assessment Handoff:  Cardiac Rhythm: Normal sinus rhythm Lab Results  Component Value Date   TROPONINI <0.03 05/16/2018   Lab Results  Component Value Date   DDIMER 0.55 (H) 02/14/2021   Does the Patient currently have chest pain? No   , Neuro Assessment Handoff:  Swallow screen pass? Yes  Cardiac Rhythm: Normal sinus rhythm       Neuro Assessment:   Neuro Checks:      Last Documented NIHSS Modified Score:   Has TPA been given? No If patient is a Neuro Trauma and patient is going to OR before floor call report to 4N Charge nurse: [phone] or [phone]  , Renal Assessment Handoff:  Hemodialysis Schedule: Hemodialysis Schedule: Monday/Wednesday/Friday Last Hemodialysis date and time: Today    Restricted appendage: left arm  , Pulmonary Assessment Handoff:  Lung sounds: Bilateral Breath Sounds: Diminished O2 Device: Nasal Cannula O2 Flow Rate (L/min): 2 L/min    R Recommendations: See Admitting Provider Note  Report given to:  Additional Notes: patient completed HD at 0300 this AM. Removed 2L over 3.5hrs. Patient SBP remains high after multiple interventions. MD hospitalists is aware

## 2021-07-17 NOTE — TOC Progression Note (Addendum)
Transition of Care [facility]) - Progression Note    Patient Details  Name: Jodi [place] MRN: [id number] Date of Birth: 06-26-1960  [facility]) CM/SW Contact  Zenon Mayo, RN Phone Number: 07/17/2021, 3:51 PM  Clinical Narrative:    From home with her daughter and her SIL and grand baby.  Patient states she still drives sometimes to her MD apts and sometimes her daughter takes her.  NCM offered choice for HHPT, she states she has been going to  [facility] outpatient to get her physical therapy  [phone] and would prefer to continue to go there.  She has home oxygen 2 liters continuously.  She has two walkers and she has a rollator, bsc that she uses for a shower chair, she also has a cane. She will have transportation at dc.  TOC will continue to follow for dc needs.   Per [facility] Physical therapy her next apt is on 6/13 at 10:30.  [phone].          Expected Discharge Plan and Services                                                 Social Determinants of Health (SDOH) Interventions    Readmission Risk Interventions    06/28/2021    5:10 PM  Readmission Risk Prevention Plan  Transportation Screening Complete  Medication Review ([place]) Complete  HRI or [place] Complete  [place] Not Applicable

## 2021-07-17 NOTE — Evaluation (Signed)
Occupational Therapy Evaluation Patient Details Name: Jodi Bell MRN: [id number] DOB: [birth year] Today's Date: 07/17/2021   History of Present Illness Patient is a 61 yo female presenting to the ED with chest pain and admitted with same diagnosis on 07/16/21. 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   Clinical Impression    Prior to this admission, patient living with her daughter and son in law, and independent with ADLs and still driving. Currently, patient presenting with need for increased O2, decreased activity tolerance, and generalized pain with movement. Patient also received on [facility] with noted blood in her stool (NT aware and present, RN notified as well as end of session). Patient is min guard for ADLs and ambulation, but limited in her overall activity due to increased fatigue (HG is also low). OT will continue to follow acutely, but anticipate no OT needs at discharge.    Recommendations for follow up therapy are one component of a multi-disciplinary discharge planning process, led by the attending physician.  Recommendations may be updated based on patient status, additional functional criteria and insurance authorization.   Follow Up Recommendations  No OT follow up    Assistance Recommended at Discharge PRN  Patient can return home with the following A little help with walking and/or transfers;A little help with bathing/dressing/bathroom;Assistance with cooking/housework;Assist for transportation    Functional Status Assessment  Patient has had a recent decline in their functional status and demonstrates the ability to make significant improvements in function in a reasonable and predictable amount of time.  Equipment Recommendations  None recommended by OT    Recommendations for Other Services        Precautions / Restrictions Precautions Precautions: Fall Restrictions Weight Bearing Restrictions: No      Mobility Bed Mobility Overal bed mobility: Needs Assistance Bed Mobility: Supine to Sit, Sit to Supine     Supine to sit: Min guard Sit to supine: Min guard   General bed mobility comments: for safety    Transfers Overall transfer level: Needs assistance Equipment used: None Transfers: Bed to chair/wheelchair/BSC, Sit to/from Stand Sit to Stand: Min guard Stand pivot transfers: Min guard         General transfer comment: able to complete sit<>stands and stand pivot without physical assist, gaurded with all movement, did not attempt increased ambulation due to fatigue      Balance Overall balance assessment: Needs assistance Sitting-balance support: Single extremity supported, Feet supported Sitting balance-Leahy Scale: Fair     Standing balance support: Single extremity supported, During functional activity Standing balance-Leahy Scale: Fair Standing balance comment: reaching out for surfaces with minimal ambulation                           ADL either performed or assessed with clinical judgement   ADL Overall ADL's : Needs assistance/impaired Eating/Feeding: Set up;Sitting   Grooming: Set up;Sitting   Upper Body Bathing: Set up;Sitting   Lower Body Bathing: Minimal assistance;Sitting/lateral leans;Sit to/from stand   Upper Body Dressing : Set up;Sitting   Lower Body Dressing: Minimal assistance;Sitting/lateral leans;Sit to/from stand   Toilet Transfer: Min Patent examiner Details (indicate cue type and reason): able to complete without external assist but gaurded with all movement Toileting- Water quality scientist and Hygiene: Min guard;Sit to/from stand;Sitting/lateral lean  Functional mobility during ADLs: Min guard General ADL Comments: Patient presenting with need for increased O2, decreased  activity tolerance, and generalized pain with movement     Vision Baseline Vision/History: 1 Wears glasses (Readers) Ability to See in Adequate Light: 0 Adequate Patient Visual Report: No change from baseline       Perception     Praxis      Pertinent Vitals/Pain Pain Assessment Pain Assessment: Faces Faces Pain Scale: Hurts a little bit Pain Location: generalized with movement Pain Descriptors / Indicators: Grimacing, Guarding, Discomfort Pain Intervention(s): Limited activity within patient's tolerance, Monitored during session, Repositioned     Hand Dominance Right   Extremity/Trunk Assessment Upper Extremity Assessment Upper Extremity Assessment: Generalized weakness (Arthritis in shoulders and hands)   Lower Extremity Assessment Lower Extremity Assessment: Defer to PT evaluation   Cervical / Trunk Assessment Cervical / Trunk Assessment: Kyphotic (minimally)   Communication Communication Communication: No difficulties   Cognition Arousal/Alertness: Awake/alert Behavior During Therapy: WFL for tasks assessed/performed Overall Cognitive Status: Within Functional Limits for tasks assessed                                       General Comments  Patient on 4L oxygen ranging from 95-99% with movement    Exercises     Shoulder Instructions      Home Living Family/patient expects to be discharged to:: Private residence Living Arrangements: Children Available Help at Discharge: Family;Available PRN/intermittently Type of Home: House Home Access: Level entry     Home Layout: Multi-level;Able to live on main level with bedroom/bathroom Alternate Level Stairs-Number of Steps: 13 to kitchen Alternate Level Stairs-Rails: Left Bathroom Shower/Tub: Teacher, early years/pre: Standard Bathroom Accessibility: Yes   Home Equipment: BSC/3in1;Cane - single point;Rolling Walker (2 wheels);Rollator (4 wheels)   Additional Comments: Lives with  daughter and son-in-law (who works later in day), another daughter lives nearby; family can provide near 24/7 upon return home if needed      Prior Functioning/Environment Prior Level of Function : Driving;Independent/Modified Independent             Mobility Comments: walked with a rollator occasionally, not typically in the house ADLs Comments: able to complete independently        OT Problem List: Decreased strength;Decreased range of motion;Decreased activity tolerance;Impaired balance (sitting and/or standing);Cardiopulmonary status limiting activity      OT Treatment/Interventions: Self-care/ADL training;Therapeutic exercise;Energy conservation;DME and/or AE instruction;Therapeutic activities;Patient/family education;Balance training;Manual therapy    OT Goals(Current goals can be found in the care plan section) Acute Rehab OT Goals Patient Stated Goal: to feel better OT Goal Formulation: With patient Time For Goal Achievement: 07/31/21 Potential to Achieve Goals: Good ADL Goals Pt Will Perform Lower Body Bathing: Independently;sit to/from stand;sitting/lateral leans Pt Will Perform Lower Body Dressing: Independently;sitting/lateral leans;sit to/from stand Pt Will Transfer to Toilet: Independently;ambulating Additional ADL Goal #1: Patient will demonstrate increased activity tolerance in order to complete functional task in standing for 3-5 minutes without seated rest break for safe discharge home. Additional ADL Goal #2: Patient will be able to verbalize 3 strategies for energy conservation for safe dischage home.  OT Frequency: Min 2X/week    Co-evaluation              AM-PAC OT "6 Clicks" Daily Activity     Outcome Measure Help from another person eating meals?: A Little Help from another person  taking care of personal grooming?: A Little Help from another person toileting, which includes using toliet, bedpan, or urinal?: A Little Help from another person  bathing (including washing, rinsing, drying)?: A Little Help from another person to put on and taking off regular upper body clothing?: A Little Help from another person to put on and taking off regular lower body clothing?: A Little 6 Click Score: 18   End of Session Nurse Communication: Mobility status  Activity Tolerance: Patient tolerated treatment well;Patient limited by fatigue Patient left: in bed;with call bell/phone within reach  OT Visit Diagnosis: Unsteadiness on feet (R26.81);Other abnormalities of gait and mobility (R26.89);Muscle weakness (generalized) (M62.81)                Time: 7414-2395 OT Time Calculation (min): 11 min Charges:  OT General Charges $OT Visit: 1 Visit OT Evaluation $OT Eval Moderate Complexity: 1 Mod  Corinne Ports E. Kimarion Chery, OTR/L Acute Rehabilitation Services [phone] [place] 07/17/2021, 9:02 AM

## 2021-07-17 NOTE — ED Notes (Signed)
Floor contacted to initiate purpleman

## 2021-07-17 NOTE — Progress Notes (Addendum)
Patient requests sorbitol refill on discharge.  Order received to push 10mg  labetalol in response to SBP 206. Order received via securechat to provide morning meds "a little early"; provided this information to oncoming AM RN.  SBP causing MEWS score of 2; will reassess with AM vital signs.

## 2021-07-17 NOTE — Progress Notes (Signed)
Mobility Specialist Progress Note:   07/17/21 1038  Mobility  Activity Ambulated with assistance in room  Level of Assistance Modified independent, requires aide device or extra time  Distance Ambulated (ft) 20 ft  Activity Response Tolerated well  $Mobility charge 1 Mobility   Pt received EOB willing to participate in mobility. No complaints of pain. Left at sink in room to get washed up.   [facility] Nery Kalisz Mobility Specialist

## 2021-07-17 NOTE — Progress Notes (Signed)
Patient ID: Jodi Bell, female   DOB: 1960/12/28, 61 y.o.   MRN: [id number] [facility] Progress Note   Assessment/ Plan:   1.  Hypoxic respiratory failure: Secondary to volume overload in combination with acute exacerbation of COPD.  Underwent hemodialysis with ultrafiltration overnight and remains on supplemental oxygen via nasal cannula. 2. ESRD: She is routinely on a Monday/Wednesday/Friday hemodialysis schedule and underwent hemodialysis earlier this morning/overnight for efforts at ultrafiltration. 3. Anemia: Lower hemoglobin and hematocrit noted, no overt losses and will continue ESA. 4. CKD-MBD: Calcium level acceptable, monitor on phosphorus binder and Hectorol for PTH suppression. 5. Nutrition: Continue renal diet with ONS and fluid restriction. 6. Hypertension: Elevated blood pressure noted, reeducated on limiting interdialytic weight gain as well as adherence with antihypertensive therapy.  Subjective:   Reports to be feeling tired after being in the emergency room gurney for most of the night and just coming back from dialysis.   Objective:   BP (!) 169/84 (BP Location: Right Arm)   Pulse 70   Temp 99 F (37.2 C) (Oral)   Resp 20   Ht 5\' 3"  (1.6 m)   Wt 65.5 kg   SpO2 98%   BMI 25.58 kg/m   Physical Exam: Gen: Comfortably sitting up in bed, about to begin breakfast CVS: Pulse regular rhythm, normal rate, S1 and S2 normal Resp: Distant breath sounds bilaterally, no distinct rales or rhonchi Abd: Soft, flat, nontender, bowel sounds normal Ext: No lower extremity edema, left upper arm AV fistula with intact dressings  Labs: BMET Recent Labs  Lab 07/16/21 1059 07/17/21 0500  NA 139 133*  K 4.0 3.3*  CL 100 94*  CO2 28 29  GLUCOSE 75 80  BUN 22 11  CREATININE 4.49* 2.45*  CALCIUM 8.1* 8.4*   CBC Recent Labs  Lab 07/16/21 1059 07/17/21 0500  WBC 4.8 4.5  HGB 7.6* 7.2*  HCT 25.7* 24.1*  MCV 108.0* 106.2*  PLT 152 157      Medications:     allopurinol  100 mg Oral QPM   amLODipine  5 mg Oral Daily   Chlorhexidine Gluconate Cloth  6 each Topical Q0600   cloNIDine  0.1 mg Oral TID   heparin injection (subcutaneous)  5,000 Units Subcutaneous Q8H   hydrALAZINE  100 mg Oral TID   irbesartan  300 mg Oral Daily   isosorbide mononitrate  60 mg Oral Daily   labetalol  400 mg Oral TID   montelukast  10 mg Oral Q supper   oxyCODONE-acetaminophen  1 tablet Oral Once   pantoprazole  40 mg Oral Daily   predniSONE  5 mg Oral Q breakfast   umeclidinium bromide  1 puff Inhalation Daily   Elmarie Shiley, MD 07/17/2021, 8:25 AM

## 2021-07-17 NOTE — Evaluation (Signed)
Physical Therapy Evaluation Patient Details Name: Jodi Bell MRN: [id number] DOB: 08/28/1960 Today's Date: 07/17/2021  History of Present Illness  Patient is a 61 yo female presenting to the ED with chest pain and admitted with same diagnosis on 07/16/21. 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  Clinical Impression  Pt was seen after admission for chest pain that was relieved with HD.  Pt is demonstrating a greater need for O2, although her sats were stable for mobility on O2.  Pt is home alone currently only 2.5 hours a day and should be able to safely make this transition with HHPT and continued HD as ordered.  Pt is motivated and attentive, aware of her endurance being low but did have HD overnight.  Follow along acutely for goals of PT with focus on monitoring balance, on increasing endurance and challenging both with high level skills that are also not reducing her O2 via cannula to a desaturated level.      Recommendations for follow up therapy are one component of a multi-disciplinary discharge planning process, led by the attending physician.  Recommendations may be updated based on patient status, additional functional criteria and insurance authorization.  Follow Up Recommendations Home health PT    Assistance Recommended at Discharge Intermittent Supervision/Assistance  Patient can return home with the following  A little help with walking and/or transfers;A little help with bathing/dressing/bathroom;Assistance with cooking/housework;Assist for transportation;Help with stairs or ramp for entrance    Equipment Recommendations None recommended by PT  Recommendations for Other Services       Functional Status Assessment Patient has had a recent decline in their functional status and demonstrates the ability to  make significant improvements in function in a reasonable and predictable amount of time.     Precautions / Restrictions Precautions Precautions: Fall Restrictions Weight Bearing Restrictions: No Other Position/Activity Restrictions: on O2 with management of line on      Mobility  Bed Mobility Overal bed mobility: Needs Assistance Bed Mobility: Supine to Sit, Sit to Supine     Supine to sit: Supervision Sit to supine: Supervision   General bed mobility comments: for safety    Transfers Overall transfer level: Needs assistance Equipment used: None Transfers: Sit to/from Stand Sit to Stand: Supervision           General transfer comment: supervised her to get to the door and back with pt verbalizing significant fatigue with all effort    Ambulation/Gait Ambulation/Gait assistance: Min guard, Supervision Gait Distance (Feet): 60 Feet (30 x 2) Assistive device: None Gait Pattern/deviations: Step-through pattern, Wide base of support Gait velocity: reduced Gait velocity interpretation: <1.31 ft/sec, indicative of household ambulator Pre-gait activities: static balance ck with posterior and lateral LOB with closer gait stances General Gait Details: pt is able to turn and maneuver with PT assisting only the O2 line  Stairs            Wheelchair Mobility    Modified Rankin (Stroke Patients Only)       Balance Overall balance assessment: Needs assistance Sitting-balance support: Feet supported Sitting balance-Leahy Scale: Fair     Standing balance support: No upper extremity supported Standing balance-Leahy Scale: Fair Standing balance comment: pt is not using an AD but is managing gait with controlling speed  Pertinent Vitals/Pain Pain Assessment Pain Assessment: Faces Faces Pain Scale: No hurt    Home Living Family/patient expects to be discharged to:: Private residence Living Arrangements:  Children Available Help at Discharge: Family;Available PRN/intermittently Type of Home: House Home Access: Level entry     Alternate Level Stairs-Number of Steps: 13 to kitchen Home Layout: Multi-level;Able to live on main level with bedroom/bathroom Home Equipment: BSC/3in1;Cane - single point;Rolling Walker (2 wheels);Rollator (4 wheels) Additional Comments: Lives with daughter and son-in-law (who works later in day), another daughter lives nearby; family can provide near 24/7 upon return home if needed    Prior Function Prior Level of Function : Driving;Independent/Modified Independent             Mobility Comments: walked with a rollator occasionally, not typically in the house ADLs Comments: able to complete independently     Hand Dominance   Dominant Hand: Right    Extremity/Trunk Assessment   Upper Extremity Assessment Upper Extremity Assessment: Defer to OT evaluation    Lower Extremity Assessment Lower Extremity Assessment: Generalized weakness    Cervical / Trunk Assessment Cervical / Trunk Assessment: Kyphotic (minimal change)  Communication   Communication: No difficulties  Cognition Arousal/Alertness: Awake/alert Behavior During Therapy: WFL for tasks assessed/performed Overall Cognitive Status: Within Functional Limits for tasks assessed                                          General Comments General comments (skin integrity, edema, etc.): 4L O2 with sats pre and post gait 100%    Exercises     Assessment/Plan    PT Assessment Patient needs continued PT services  PT Problem List Decreased strength;Decreased balance;Cardiopulmonary status limiting activity       PT Treatment Interventions DME instruction;Gait training;Stair training;Functional mobility training;Therapeutic activities;Therapeutic exercise;Balance training;Neuromuscular re-education;Patient/family education    PT Goals (Current goals can be found in the Care  Plan section)  Acute Rehab PT Goals Patient Stated Goal: to get home and feel better PT Goal Formulation: With patient Time For Goal Achievement: 07/24/21 Potential to Achieve Goals: Good    Frequency Min 3X/week     Co-evaluation               AM-PAC PT "6 Clicks" Mobility  Outcome Measure Help needed turning from your back to your side while in a flat bed without using bedrails?: None Help needed moving from lying on your back to sitting on the side of a flat bed without using bedrails?: A Little Help needed moving to and from a bed to a chair (including a wheelchair)?: A Little Help needed standing up from a chair using your arms (e.g., wheelchair or bedside chair)?: A Little Help needed to walk in hospital room?: A Little Help needed climbing 3-5 steps with a railing? : A Little 6 Click Score: 19    End of Session Equipment Utilized During Treatment: Gait belt;Oxygen Activity Tolerance: Patient tolerated treatment well;Patient limited by fatigue Patient left: in bed;with call bell/phone within reach;with bed alarm set Nurse Communication: Mobility status PT Visit Diagnosis: Unsteadiness on feet (R26.81);Muscle weakness (generalized) (M62.81)    Time: 1100-1120 PT Time Calculation (min) (ACUTE ONLY): 20 min   Charges:   PT Evaluation $PT Eval Moderate Complexity: 1 Mod         Ramond Dial 07/17/2021, 1:55 PM  Mee Hives, PT PhD Acute Rehab Dept.  Number: [place] and [place]

## 2021-07-17 NOTE — Progress Notes (Addendum)
Subjective:  Overnight Events: No acute events or concerns overnight. She did receive dialysis overnight and had 2L removed.  The patient was seen and evaluated on rounds this morning. She states that her chest pain has mostly resolved and she is feeling well. She reports an episode of hematochezia described as black stool with a moderate amount of bright red blood when she wiped. She states that she has had black stools in the past on iron supplementation but has not recently had bright red blood. She does endorse history of internal hemorrhoids.   Objective:  Vital signs in last 24 hours: Vitals:   07/17/21 0453 07/17/21 0530 07/17/21 0625 07/17/21 0714  BP: (!) 180/69 (!) 185/76 (!) 165/72 (!) 169/84  Pulse:  72 69 70  Resp:   14 20  Temp:   98.1 F (36.7 C) 99 F (37.2 C)  TempSrc:   Oral Oral  SpO2:  100% 100% 98%  Weight:   65.5 kg   Height:   5\' 3"  (1.6 m)    Weight change:   Intake/Output Summary (Last 24 hours) at 07/17/2021 9381 Last data filed at 07/17/2021 0316 Gross per 24 hour  Intake 14.22 ml  Output 2000 ml  Net -1985.78 ml    Physical Exam   Constitutional: well appearing and sitting in bed, in no acute distress HENT: normocephalic atraumatic, mucous membranes moist Eyes: pupils equal and round, conjunctiva non-erythematous Cardiovascular: regular rate with normal rhythm, no m/r/g Pulmonary/Chest: normal work of breathing on room air, lungs clear to auscultation bilaterally Abdominal: soft, non-tender, non-distended, bowel sounds present Rectal: Patient with trace amount of blood and a small external hemorrhoid on external rectal exam. Blood does not appear to be coming from the external hemorrhoid.  MSK: normal bulk and tone, no peripheral edema Skin: warm and dry. Neurological: alert and answering questions appropriately. Psych: appropriate mood and affect   Assessment/Plan:  Principal Problem:   Chest pain Active Problems:   Essential  hypertension   ESRD (end stage renal disease) (HCC)  Jodi Bell is a 61 y.o. female with past medical history of hypertension, type 2 diabetes mellitus, COPD on 3 L supplemental oxygen at baseline, ESRD on HD MWF who presents with left sided chest pain beginning this morning around 0800. She was found to be hypertensive to 218/112 in the ED.    Hypertensive Emergency Patient presented to the ED with chest pain found to be hypervolemic with pulmonary edema and severely hypertensive to 218/112 likely causing her chest pain. ACS was ruled out with EKG and serial troponins. She was started on a nitro drip in the ED and now has been weaned off of the drip. Blood pressure today is improved 169/84 following dialysis overnight. She has been resumed on her home antihypertensives.  -HD for volume removal, appreciate nephrology  -Labetalol 400 mg TID -Amlodipine 5 mg QD -Hydralazine 100 mg TID -Clonidine 0.1 mg TID -Irbesartan 300 mg QD  Non cardiac chest pain  CAD History of NSTEMI with CABG x 2 in August 2022 Patient presented with left sided chest pain described as pressure. Cardiology evaluated the patient yesterday and stating chest pain likely secondary to hypertensive emergency. They recommended slowly weaning her off of nitro drip, restarting home BP meds, increasing her Imdur dose to 60 mg, outpatient follow-up with them for stress test. Patient has been weaned off of nitro drip. Home meds will be restarted today. Lipid panel within normal limits. Cardiology has signed off. -Cardiology outpatient follow-up  for stress test -Labetalol 400 mg TID -Amlodipine 5 mg QD -Hydralazine 100 mg TID -Clonidine 0.1 mg TID -Irbesartan 300 mg QD   Hematochezia History of internal hemorrhoids Acute on Chronic macrocytic Anemia Patient's hemoglobin down to 7.2 today from 7.6 yesterday and 8.5 approximately 2 weeks ago. Patient's MCV is elevated at 106.2. Iron studies do not show evidence of iron  deficiency. Folate within normal limits. Vitamin B12 within normal limits. Reticulocyte index calculated to be 0.84 indicating hypoproliferative anemia. Nurse did report an episode of hematochezia this morning, though the nurse states she did not see the patient's bowel movement. Rectal exam showed trace amount of blood and a small external hemorrhoid on external rectal exam. Blood does not appear to be coming from the external hemorrhoid. Patient did have a colonoscopy 10/02/2020 that showed internal hemorrhoids as well as diverticulosis in the throughout the colon. Bleeding this morning likely secondary to hemorrhoids or diverticulosis. Given downtrending hemoglobin with hematochezia plan to transfuse today during dialysis session. Patient is agreeable to this. -Transfuse 1 u PRBC today -Post transfusion H&H   Pulmonary Vascular Congestion End Stage Renal Disease on MWF Hemodialysis Patient with history of ESRD on MWF dialysis. GFR is 11 on admission. Nephrology is following. On admission, patient's CXR showed pulmonary vascular congestion, patient also with elevated JVD to suggest fluid overload. The patient reports her dry weight is 60 kg, and she presented at 64.5 kg. Nephrology dialyzed her last night and pulled off 2L. Nephrology states patient does not need dialysis today. -Nephrology following, appreciate recommendations. -HD per nephro recs   COPD OSA (CPAP noncompliant) Chronic respiratory failure  Patient with longstanding COPD on 3 L supplemental oxygen at home in addition to tiotropium, montelukast, and Albuterol rescue inhalers. Last exacerbation was within the past month. Patient currently with oxygen saturation of 100% on 2L O2 via nasal cannula and does not appear to be in exacerbation at this time.  -PRN albuterol -Home montelukast -Home tiotropium -PT/OT eval and treat   History of Type 2 Diabetes Mellitus Patient with previous type 2 diabetes mellitus. Her A1C has been as high  as 6.9 several years ago. Most recent A1C of 5.2 in August 2022. She is not currently on diabetes medication at home.    GERD History of bleeding esophageal ulcer History of Esophagitis Patient with history of GERD as well as esophagitis with bleeding esophageal ulcer in April 2021. She is on pantoprazole 40 mg at home. This was continued during admission and will be continued at discharge.   -Pantoprazole 40 mg qd   Gout Patient with previous episodes of gout on Allopurinol at home. Plan to continue during admission. -Allopurinol 100 mg.    Psoriatic Arthritis Patient with osteoarthritis and psoriatic arthritis on prednisone 5 mg at home. Plan to continue this during admisison. -Prednisone 5 mg   Insomnia Continue home ambien qhs prn   Diet: Renal VTE: Heparin IVF: None Code: Full   Prior to Admission Living Arrangement: Home Anticipated Discharge Location: TBD Barriers to Discharge: Medical Stability Dispo: Anticipated discharge in approximately 1-2 day(s).    LOS: 0 days   Elane Fritz, Medical Student 07/17/2021, 7:33 AM   Attestation for Student Documentation:  I personally was present and performed or re-performed the history, physical exam and medical decision-making activities of this service and have verified that the service and findings are accurately documented in the student's note.  [name], MD 07/17/2021, 12:13 PM

## 2021-07-17 NOTE — Care Management Obs Status (Signed)
[facility] NOTIFICATION   Patient Details  Name: NIAYA HICKOK MRN: [id number] Date of Birth: [birth year]   Medicare Observation Status Notification Given:  Yes    Zenon Mayo, RN 07/17/2021, 3:48 PM

## 2021-07-17 NOTE — Progress Notes (Signed)
Progress Note  Patient Name: Jodi Bell Date of Encounter: 07/17/2021  [facility] Cardiologist: Candee Furbish, MD   Subjective   Feels better after IHD early this AM   Off nitro gtt Hgb 7s  Inpatient Medications    Scheduled Meds:  allopurinol  100 mg Oral QPM   amLODipine  5 mg Oral Daily   Chlorhexidine Gluconate Cloth  6 each Topical Q0600   cloNIDine  0.1 mg Oral TID   heparin injection (subcutaneous)  5,000 Units Subcutaneous Q8H   hydrALAZINE  100 mg Oral TID   irbesartan  300 mg Oral Daily   isosorbide mononitrate  60 mg Oral Daily   labetalol  400 mg Oral TID   montelukast  10 mg Oral Q supper   oxyCODONE-acetaminophen  1 tablet Oral Once   pantoprazole  40 mg Oral Daily   predniSONE  5 mg Oral Q breakfast   umeclidinium bromide  1 puff Inhalation Daily   Continuous Infusions:  nitroGLYCERIN Stopped (07/16/21 1736)   PRN Meds: acetaminophen **OR** acetaminophen, albuterol, LORazepam, ondansetron, polyethylene glycol   Vital Signs    Vitals:   07/17/21 0453 07/17/21 0530 07/17/21 0625 07/17/21 0714  BP: (!) 180/69 (!) 185/76 (!) 165/72 (!) 169/84  Pulse:  72 69 70  Resp:   14 20  Temp:   98.1 F (36.7 C) 99 F (37.2 C)  TempSrc:   Oral Oral  SpO2:  100% 100% 98%  Weight:   65.5 kg   Height:   5\' 3"  (1.6 m)     Intake/Output Summary (Last 24 hours) at 07/17/2021 0824 Last data filed at 07/17/2021 0316 Gross per 24 hour  Intake 14.22 ml  Output 2000 ml  Net -1985.78 ml      07/17/2021    6:25 AM 07/17/2021    3:16 AM 07/16/2021   11:35 PM  Last 3 Weights  Weight (lbs) 144 lb 6.4 oz 154 lb 1.6 oz 158 lb 8.2 oz  Weight (kg) 65.5 kg 69.9 kg 71.9 kg      Telemetry    NSR - Personally Reviewed  ECG    NA - Personally Reviewed  Physical Exam   Vitals:   07/17/21 0625 07/17/21 0714  BP: (!) 165/72 (!) 169/84  Pulse: 69 70  Resp: 14 20  Temp: 98.1 F (36.7 C) 99 F (37.2 C)  SpO2: 100% 98%    GEN: No acute distress.   Neck: No  JVD Cardiac: RRR, no murmurs, rubs, or gallops.  Respiratory: improved wob, mild decreased BS in the bases GI: Soft, nontender, non-distended  MS: No edema; No deformity. Neuro:  Nonfocal  Psych: Normal affect   Labs    High Sensitivity Troponin:   Recent Labs  Lab 07/16/21 1059 07/16/21 1316  TROPONINIHS 15 17     Chemistry Recent Labs  Lab 07/16/21 1059 07/17/21 0500  NA 139 133*  K 4.0 3.3*  CL 100 94*  CO2 28 29  GLUCOSE 75 80  BUN 22 11  CREATININE 4.49* 2.45*  CALCIUM 8.1* 8.4*  MG 1.7  --   GFRNONAA 11* 22*  ANIONGAP 11 10    Lipids  Recent Labs  Lab 07/16/21 1316  CHOL 131  TRIG 34  HDL 55  LDLCALC 69  CHOLHDL 2.4    Hematology Recent Labs  Lab 07/16/21 1059 07/16/21 1608 07/17/21 0500  WBC 4.8  --  4.5  RBC 2.38* 2.30* 2.27*  HGB 7.6*  --  7.2*  HCT 25.7*  --  24.1*  MCV 108.0*  --  106.2*  MCH 31.9  --  31.7  MCHC 29.6*  --  29.9*  RDW 17.4*  --  17.2*  PLT 152  --  157   Thyroid No results for input(s): TSH, FREET4 in the last 168 hours.  BNPNo results for input(s): BNP, PROBNP in the last 168 hours.  DDimer No results for input(s): DDIMER in the last 168 hours.   Radiology    DG Chest Portable 1 View  Result Date: 07/16/2021 CLINICAL DATA:  Chest pain that radiates to the back. History of CHF. EXAM: PORTABLE CHEST 1 VIEW COMPARISON:  Chest radiograph dated Jun 27, 2021 FINDINGS: The heart is enlarged. Evidence of prior coronary artery bypass grafting. Atherosclerotic calcification of the aortic arch. Prominence of the central pulmonary vessels. No appreciable pleural effusion or frank pulmonary edema. Sternotomy wires are intact. Partially imaged anterior cervical discectomy and fusion hardware. IMPRESSION: 1.  Stable cardiomegaly. 2. Pulmonary vascular congestion without evidence of focal consolidation or pleural effusion. Electronically Signed   By: Keane Police D.O.   On: 07/16/2021 11:32    Cardiac Studies   TTE 06/26/2021  1.  Left ventricular ejection fraction, by estimation, is 70 to 75%. The  left ventricle has hyperdynamic function. The left ventricle has no  regional wall motion abnormalities. There is severe left ventricular  hypertrophy. Left ventricular diastolic  parameters are consistent with Grade II diastolic dysfunction  (pseudonormalization). Elevated left ventricular end-diastolic pressure.   2. Right ventricular systolic function is normal. The right ventricular  size is normal. There is normal pulmonary artery systolic pressure.   3. Left atrial size was severely dilated.   4. Right atrial size was mildly dilated.   5. The mitral valve is degenerative. Mild mitral valve regurgitation. No  evidence of mitral stenosis. Severe mitral annular calcification.   6. Tricuspid valve regurgitation is mild to moderate.   7. Visually, by planimetry, and by DI aortic valve has moderate stenosis.  However, peak velocity >4, suggesting severe stenosis. This does not  appear to be explained by LVOT gradient/obstruction. The aortic valve is  tricuspid. There is moderate  calcification of the aortic valve. Aortic valve regurgitation is not  visualized. Moderate to severe aortic valve stenosis.   8. The inferior vena cava is dilated in size with <50% respiratory  variability, suggesting right atrial pressure of 15 mmHg.  Patient Profile     Jodi Bell is a 61 y.o. female with a hx of COPD/chronic hypoxic respiratory failure on 2 to 3 L, CAD status post CABG x2 '22, hypertension, ESRD on HD, OSA (noncompliant with CPAP), paroxysmal atrial fibrillation postoperatively, diabetes who is being seen 07/16/2021 for the evaluation of chest pain at the request of Dr. Jeanell Sparrow  Assessment & Plan    #Chest pain: low concern for ACS. She has improvement after her IHD session. Will plan for an outpatient lexiscan -asa 81 mg daily - increased her imdur - follow up with Dr. Marlou Porch  #HTN: challenging with ESRD. Continue off  nitro gtt. Recommend to continue her home medications and I'll increase her norvasc to 10 mg daiy  #Mild-Moderate AS: stable, no symptoms. Surveillance per Dr. Marlou Porch  From a cardiac standpoint she is stable for discharge. We can help arrange follow up and the [place]. Otherwise, cardiology will sign off  For questions or updates, please contact [place] Please consult [email or web address] for contact info under  Signed, Janina Mayo, MD  07/17/2021, 8:24 AM

## 2021-07-17 NOTE — ED Notes (Signed)
Patient ready to be received via Floor RN Rodman Key

## 2021-07-18 ENCOUNTER — Encounter (HOSPITAL_COMMUNITY): Payer: Self-pay | Admitting: Student

## 2021-07-18 DIAGNOSIS — K921 Melena: Secondary | ICD-10-CM | POA: Diagnosis not present

## 2021-07-18 DIAGNOSIS — M549 Dorsalgia, unspecified: Secondary | ICD-10-CM

## 2021-07-18 DIAGNOSIS — R079 Chest pain, unspecified: Secondary | ICD-10-CM | POA: Diagnosis not present

## 2021-07-18 DIAGNOSIS — N186 End stage renal disease: Secondary | ICD-10-CM | POA: Diagnosis not present

## 2021-07-18 LAB — BASIC METABOLIC PANEL
Anion gap: 9 (ref 5–15)
BUN: 33 mg/dL — ABNORMAL HIGH (ref 8–23)
CO2: 26 mmol/L (ref 22–32)
Calcium: 8.3 mg/dL — ABNORMAL LOW (ref 8.9–10.3)
Chloride: 96 mmol/L — ABNORMAL LOW (ref 98–111)
Creatinine, Ser: 4.17 mg/dL — ABNORMAL HIGH (ref 0.44–1.00)
GFR, Estimated: 12 mL/min — ABNORMAL LOW (ref >60)
Glucose, Bld: 97 mg/dL (ref 70–99)
Potassium: 4.7 mmol/L (ref 3.5–5.1)
Sodium: 131 mmol/L — ABNORMAL LOW (ref 135–145)

## 2021-07-18 LAB — CBC
HCT: 23.9 % — ABNORMAL LOW (ref 36.0–46.0)
Hemoglobin: 7.5 g/dL — ABNORMAL LOW (ref 12.0–15.0)
MCH: 31.4 pg (ref 26.0–34.0)
MCHC: 31.4 g/dL (ref 30.0–36.0)
MCV: 100 fL (ref 80.0–100.0)
Platelets: 164 10*3/uL (ref 150–400)
RBC: 2.39 MIL/uL — ABNORMAL LOW (ref 3.87–5.11)
RDW: 20.3 % — ABNORMAL HIGH (ref 11.5–15.5)
WBC: 4.8 10*3/uL (ref 4.0–10.5)
nRBC: 0 % (ref 0.0–0.2)

## 2021-07-18 MED ORDER — LORAZEPAM 0.5 MG PO TABS
0.5000 mg | ORAL_TABLET | Freq: Three times a day (TID) | ORAL | Status: DC | PRN
  Administered 2021-07-18 – 2021-07-22 (×4): 0.5 mg via ORAL
  Filled 2021-07-18 (×4): qty 1

## 2021-07-18 MED ORDER — OXYCODONE HCL 5 MG PO TABS
10.0000 mg | ORAL_TABLET | Freq: Once | ORAL | Status: AC
  Administered 2021-07-18: 10 mg via ORAL
  Filled 2021-07-18: qty 2

## 2021-07-18 MED ORDER — CHLORHEXIDINE GLUCONATE CLOTH 2 % EX PADS
6.0000 | MEDICATED_PAD | Freq: Every day | CUTANEOUS | Status: DC
  Administered 2021-07-18 – 2021-07-22 (×4): 6 via TOPICAL

## 2021-07-18 MED ORDER — LABETALOL HCL 5 MG/ML IV SOLN
10.0000 mg | Freq: Once | INTRAVENOUS | Status: AC
  Administered 2021-07-18: 10 mg via INTRAVENOUS
  Filled 2021-07-18: qty 4

## 2021-07-18 MED ORDER — SODIUM CHLORIDE 0.9 % IV SOLN: INTRAVENOUS | Status: DC

## 2021-07-18 MED ORDER — OXYCODONE HCL 5 MG PO TABS
10.0000 mg | ORAL_TABLET | Freq: Two times a day (BID) | ORAL | Status: DC | PRN
  Administered 2021-07-19 – 2021-07-22 (×5): 10 mg via ORAL
  Filled 2021-07-18 (×5): qty 2

## 2021-07-18 NOTE — Progress Notes (Signed)
PT Cancellation Note  Patient Details Name: Jodi Bell MRN: [id number] DOB: July 18, 1960   Cancelled Treatment:    Reason Eval/Treat Not Completed: Patient at procedure or test/unavailable this morning (has been at HD). Will continue to follow and treat as time/schedule allows.   West Carbo, PT, DPT   Acute Rehabilitation Department   Sandra Cockayne 07/18/2021, 12:43 PM

## 2021-07-18 NOTE — Progress Notes (Signed)
Patient's blood pressure 193/65.  Called provider

## 2021-07-18 NOTE — Progress Notes (Signed)
Physical Therapy Treatment Patient Details Name: Jodi Bell MRN: [id number] DOB: Aug 14, 1960 Today's Date: 07/18/2021   History of Present Illness Patient is a 61 yo female presenting 6/6 with chest pain. Suspect chest pain due to HTN emergency, pulmonary edema, and volume overload. Pt with episode of bright red blood in stool on 6/7, planned capsule endoscopy for 6/9. PMH includes: DM II, CAD s/p CABG in 2022, anxiety, arthritis, COPD, ESRD on HD, GERD, gout, obesity, PAD, and HTN.    PT Comments    The pt was agreeable to limited session at this time due to fatigue and low back pain. The pt was able to complete a series of LE exercises while seated in recliner, some against min resistance and progressing to against gravity only due to fatigue. Will continue to benefit from skilled PT to progress functional power and strength in LE as well as activity tolerance to allow for greater independence with mobility at home after d/c.     Recommendations for follow up therapy are one component of a multi-disciplinary discharge planning process, led by the attending physician.  Recommendations may be updated based on patient status, additional functional criteria and insurance authorization.  Follow Up Recommendations  Home health PT     Assistance Recommended at Discharge Intermittent Supervision/Assistance  Patient can return home with the following A little help with walking and/or transfers;A little help with bathing/dressing/bathroom;Assistance with cooking/housework;Assist for transportation;Help with stairs or ramp for entrance   Equipment Recommendations  None recommended by PT    Recommendations for Other Services       Precautions / Restrictions Precautions Precautions: Fall Restrictions Weight Bearing Restrictions: No Other Position/Activity Restrictions: on O2     Mobility  Bed Mobility Overal bed mobility: Needs Assistance             General bed mobility comments:  pt OOB in recliner at start and end of session    Transfers Overall transfer level: Needs assistance                 General transfer comment: pt declined due to back pain         Balance Overall balance assessment: Needs assistance Sitting-balance support: Feet supported Sitting balance-Leahy Scale: Fair                                      Cognition Arousal/Alertness: Awake/alert Behavior During Therapy: WFL for tasks assessed/performed Overall Cognitive Status: Within Functional Limits for tasks assessed                                          Exercises General Exercises - Lower Extremity Long Arc Quad: AROM, Strengthening, Both, 20 reps, Seated (x5 against min resistance, then x20 AROM) Heel Slides: Strengthening, 5 reps, Seated (x5 against min resistance) Hip Flexion/Marching: AROM, Both, 20 reps, Seated (2 x 20) Heel Raises: AROM, Both, 20 reps, Seated (2 x 20)    General Comments General comments (skin integrity, edema, etc.): VSS on 2L O2      Pertinent Vitals/Pain Pain Assessment Pain Assessment: Faces Faces Pain Scale: Hurts even more Pain Location: low back Pain Descriptors / Indicators: Discomfort, Sharp, Shooting Pain Intervention(s): Limited activity within patient's tolerance, Monitored during session, Repositioned, Heat applied, Patient requesting pain meds-RN notified  PT Goals (current goals can now be found in the care plan section) Acute Rehab PT Goals Patient Stated Goal: to get home and feel better PT Goal Formulation: With patient Time For Goal Achievement: 07/24/21 Potential to Achieve Goals: Good Progress towards PT goals: Not progressing toward goals - comment (limited by back pain)    Frequency    Min 3X/week      PT Plan Current plan remains appropriate       AM-PAC PT "6 Clicks" Mobility   Outcome Measure  Help needed turning from your back to your side while in a flat bed  without using bedrails?: None Help needed moving from lying on your back to sitting on the side of a flat bed without using bedrails?: A Little Help needed moving to and from a bed to a chair (including a wheelchair)?: A Little Help needed standing up from a chair using your arms (e.g., wheelchair or bedside chair)?: A Little Help needed to walk in hospital room?: A Little Help needed climbing 3-5 steps with a railing? : A Little 6 Click Score: 19    End of Session Equipment Utilized During Treatment: Gait belt;Oxygen Activity Tolerance: Patient tolerated treatment well;Patient limited by fatigue Patient left: with call bell/phone within reach;in chair;with family/visitor present Nurse Communication: Mobility status PT Visit Diagnosis: Unsteadiness on feet (R26.81);Muscle weakness (generalized) (M62.81)     Time: 9977-4142 PT Time Calculation (min) (ACUTE ONLY): 21 min  Charges:  $Therapeutic Exercise: 8-22 mins                     West Carbo, PT, DPT   Acute Rehabilitation Department Pager #: [phone]   Sandra Cockayne 07/18/2021, 5:29 PM

## 2021-07-18 NOTE — Progress Notes (Signed)
BP 198/65 with 1 hour and 40 minutes to complete treatment.  Physician ordered that her 10am schedule hydralazine and Imdur be given now.

## 2021-07-18 NOTE — Progress Notes (Signed)
Patient ID: Jodi Bell, female   DOB: [birth year], 61 y.o.   MRN: [id number] [facility] Progress Note   Assessment/ Plan:   1.  Hypoxic respiratory failure: Secondary to volume overload in combination with acute exacerbation of COPD.  Reports that respiratory status is back to baseline (she has chronic hypoxic respiratory failure for which she uses supplemental oxygen at 2 L/min while at rest with increase to 3 L/min with activity). 2. ESRD: She is routinely on a Monday/Wednesday/Friday hemodialysis schedule and underwent hemodialysis earlier yesterday morning, will plan to undertake an additional hemodialysis treatment today for efforts at ultrafiltration/blood pressure control. 3. Anemia: Lower hemoglobin and hematocrit noted, no overt losses and will continue ESA.  Concern noted about intermittent hematochezia that now appears to be likely from external hemorrhoid for which she is now on treatment. 4. CKD-MBD: Calcium level acceptable, monitor on phosphorus binder and Hectorol for PTH suppression. 5. Nutrition: Continue renal diet with ONS and fluid restriction. 6. Hypertension: Elevated blood pressure noted, reeducated on limiting interdialytic weight gain as well as adherence with antihypertensive therapy.  Subjective:   Reports that her breathing is back to baseline but she is concerned about rectal bleeding superimposed on chronic dark stools (yesterday noted to have external hemorrhoid on exam by primary service).   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  Labs: BMET Recent Labs  Lab 07/16/21 1059 07/17/21 0500 07/18/21 0257  NA 139 133* 131*  K 4.0 3.3* 4.7  CL 100 94* 96*  CO2 28 29 26   GLUCOSE 75 80 97  BUN 22 11 33*  CREATININE 4.49* 2.45* 4.17*  CALCIUM 8.1* 8.4* 8.3*   CBC Recent Labs  Lab 07/16/21 1059 07/17/21 0500 07/17/21 1859 07/18/21 0257  WBC 4.8 4.5  --  4.8  HGB 7.6* 7.2* 7.7* 7.5*  HCT 25.7* 24.1* 24.5* 23.9*  MCV 108.0* 106.2*  --  100.0  PLT 152 157  --  164     Medications:     allopurinol  100 mg Oral QPM   amLODipine  10 mg Oral Daily   Chlorhexidine Gluconate Cloth  6 each Topical Q0600   Chlorhexidine Gluconate Cloth  6 each Topical Q0600   cloNIDine  0.1 mg Oral TID   hydrALAZINE  100 mg Oral TID   hydrocortisone   Rectal BID   irbesartan  300 mg Oral Daily   isosorbide mononitrate  60 mg Oral Daily   labetalol  400 mg Oral TID   montelukast  10 mg Oral Q supper   pantoprazole  40 mg Oral Daily   predniSONE  5 mg Oral Q breakfast   umeclidinium bromide  1 puff Inhalation Daily   Elmarie Shiley, MD 07/18/2021, 8:42 AM

## 2021-07-18 NOTE — Consult Note (Signed)
Reason for Consult: Guaiac positive anemia Referring Physician: Hospital team  Jodi Bell is an 61 y.o. female.  HPI: Patient seen and examined in her hospital computer chart in our office computer chart was reviewed and specifically her previous extensive GI work-up and at home lately she has had some black stools with periodic bright red blood in the commode and she does have known hemorrhoids and only gets constipated when she takes pain medicine and MiraLAX helps and since her back injection she has not needed any aspirin products although she did get an aspirin in the ambulance for concerns over an MI recently and her family history is negative for any GI issues she has no upper tract symptoms no other complaints and she says she has not been transfused in dialysis since last year  Past Medical History:  Diagnosis Date   Anemia of chronic disease    Anxiety    Arthritis    oa and psoriatic ra   Asthma    Back pain, chronic    lower back   Candida infection finished tx 2 days ago   Chronic insomnia    COPD (chronic obstructive pulmonary disease) ([place])    Coronary artery calcification seen on CT scan    Diabetes mellitus    type 2   Eczema    ESRD on dialysis [facility])    Essential hypertension    GERD (gastroesophageal reflux disease)    Gout    History of hiatal hernia    Hypoalbuminemia    Hyponatremia    Mild aortic stenosis    Mitral regurgitation    Mitral stenosis    Obesity    PAD (peripheral artery disease) ([place])    a. externial iliac calcification seen on CT 04/2020.   Pneumonia few yrs ago x 2   Sleep apnea    Tobacco abuse    Upper GI bleed 05/2019    Past Surgical History:  Procedure Laterality Date   BACK SURGERY  2016   lower back fusion with cage   BIOPSY  09/23/2017   Procedure: BIOPSY;  Surgeon: Wonda Horner, MD;  [facility];  Service: Endoscopy;;   BIOPSY  05/19/2019   Procedure: BIOPSY;  Surgeon: Otis Brace, MD;  Location: [place];  Service: Gastroenterology;;   CESAREAN SECTION  1990   x 1    COLONOSCOPY WITH PROPOFOL N/A 09/23/2017   Procedure: COLONOSCOPY WITH PROPOFOL Hemostatic clips placed;  Surgeon: Wonda Horner, MD;  [facility];  Service: Endoscopy;  Laterality: N/A;   COLONOSCOPY WITH PROPOFOL N/A 10/02/2020   Procedure: COLONOSCOPY WITH PROPOFOL;  Surgeon: Arta Silence, MD;  Location: [place];  Service: Endoscopy;  Laterality: N/A;   CORONARY ARTERY BYPASS GRAFT N/A 09/13/2020   Procedure: CORONARY ARTERY BYPASS GRAFTING (CABG), ON PUMP, TIMES TWO, USING LEFT INTERNAL MAMMARY ARTERY AND RIGHT ENDOSCOPICALLY HARVESTED GREATER SAPHENOUS VEIN;  Surgeon: Gaye Pollack, MD;  Location: [place];  Service: Open Heart Surgery;  Laterality: N/A;   DILATION AND CURETTAGE OF UTERUS  1988   ESOPHAGOGASTRODUODENOSCOPY N/A 09/22/2020   Procedure: ESOPHAGOGASTRODUODENOSCOPY (EGD);  Surgeon: Arta Silence, MD;  Location: [facility] END[facility]COPY;  Service: Endoscopy;  Laterality: N/A;   ESOPHAGOGASTRODUODENOSCOPY (EGD) WITH PROPOFOL N/A 09/23/2017   Procedure: ESOPHAGOGASTRODUODENOSCOPY (EGD) WITH PROPOFOL;  Surgeon: Wonda Horner, MD;  [facility];  Service: Endoscopy;  Laterality: N/A;   ESOPHAGOGASTRODUODENOSCOPY (EGD) WITH PROPOFOL N/A 05/19/2019   Procedure: ESOPHAGOGASTRODUODENOSCOPY (EGD) WITH PROPOFOL;  Surgeon: Otis Brace, MD;  [facility];  Service: Gastroenterology;  Laterality: N/A;   ESOPHAGOGASTRODUODENOSCOPY (EGD) WITH PROPOFOL N/A 10/02/2020   Procedure: ESOPHAGOGASTRODUODENOSCOPY (EGD) WITH PROPOFOL;  Surgeon: Arta Silence, MD;  Location: [place];  Service: Endoscopy;  Laterality: N/A;   GIVENS CAPSULE STUDY N/A 05/19/2019   Procedure: GIVENS CAPSULE STUDY;  Surgeon: Otis Brace, MD;  Location: [place];  Service: Gastroenterology;  Laterality: N/A;   HEMOSTASIS CLIP PLACEMENT  09/22/2020   Procedure: HEMOSTASIS CLIP PLACEMENT;  Surgeon: Arta Silence,  MD;  Location: [place];  Service: Endoscopy;;   HEMOSTASIS CONTROL  09/22/2020   Procedure: HEMOSTASIS CONTROL;  Surgeon: Arta Silence, MD;  Location: [place];  Service: Endoscopy;;   HOT HEMOSTASIS N/A 05/19/2019   Procedure: HOT HEMOSTASIS (ARGON PLASMA COAGULATION/BICAP);  Surgeon: Otis Brace, MD;  [facility]: [facility] ENDOSCOPY;  Service: Gastroenterology;  Laterality: N/A;   LEFT HEART CATH AND CORONARY ANGIOGRAPHY N/A 09/10/2020   Procedure: LEFT HEART CATH AND CORONARY ANGIOGRAPHY;  Surgeon: Nelva Bush, MD;  Location: [place] [facility];  Service: Cardiovascular;  Laterality: N/A;   PARTIAL KNEE ARTHROPLASTY Left 11/12/2017   Procedure: left unicompartmental arthroplasty-medial;  Surgeon: Paralee Cancel, MD;  [facility];  Service: Orthopedics;  Laterality: Left;  17min   SUBMUCOSAL INJECTION  09/23/2017   Procedure: SUBMUCOSAL INJECTION;  Surgeon: Wonda Horner, MD;  [facility];  Service: Endoscopy;;  in colon   TEE WITHOUT CARDIOVERSION N/A 09/13/2020   Procedure: TRANSESOPHAGEAL ECHOCARDIOGRAM (TEE);  Surgeon: Gaye Pollack, MD;  Location: [place];  Service: Open Heart Surgery;  Laterality: N/A;   TUBAL LIGATION  1990    Family History  Problem Relation Age of Onset   Lung cancer Mother    Diabetes Sister    Heart disease Sister    Asthma Daughter    Arthritis Daughter    Arthritis Daughter    Thyroid cancer Other        1/2 sister   Heart disease Other        1/2 sister    Social History:  reports that she quit smoking about 11 months ago. Her smoking use included cigarettes. She has a 20.00 pack-year smoking history. She has never used smokeless tobacco. She reports current alcohol use. She reports that she does not use drugs.  Allergies:  Allergies  Allergen Reactions   3-Methyl-2-Benzothiazolinone Hydrazone Other (See Comments)    Muscle weakness muscle cramping in legs Other reaction(s): Myalgias (Muscle Pain)   Banana Anaphylaxis,  Swelling and Other (See Comments)    TONGUE AND MOUTH SWELLING   Black Walnut Flavor Anaphylaxis and Itching    Walnuts   Hazelnut ([place]) Allergy Skin Test Anaphylaxis    Hazelnuts   Leflunomide And Related Other (See Comments)    Severe Headache   Lisinopril Swelling    Angioedema  Swelling of the face   No [employer] Food Allergies Anaphylaxis    Spicy Mustard 4.7.2021 Pt reports that she eats Regular Yellow Mustard Swelling and itching of tongue   Other Other (See Comments), Anaphylaxis and Swelling    Cosentyx= angioedema  Other reaction(s): Facial Edema (intolerance) Mouth itches and tongue swelling Hazel nuts and pecans also Walnuts Walnuts Renal failure Other reaction(s): Other Serotonin Syndrome  Serotonin syndrome  Tremors Other reaction(s): Other Tremors Muscle weakness muscle cramping in legs Other reaction(s): Myalgias (Muscle Pain) Mouth itches and tongue swelling Hazel nuts and pecans also Walnuts    Pecan Extract Allergy Skin Test Anaphylaxis and Itching    Pecans  Pecan Nut (Diagnostic) Anaphylaxis and Itching    Pecans   Trazodone And Nefazodone Other (See Comments)    Serotonin Syndrome  Tremors   Adalimumab Other (See Comments)    Blisters on abdomen =humira   Escitalopram Oxalate Other (See Comments)    Hand problems - Serotonin Syndrome     Ezetimibe Other (See Comments)    myalgias cramps    Secukinumab Swelling    Cosentyx = angiodema    Statins Other (See Comments)    Leg pains and weakness   Ferrlecit [Na Ferric Gluc Cplx In Sucrose]     Not reaction given from HD   Gabapentin Other (See Comments)    tremors   Ibuprofen Other (See Comments)    Renal Problems    Medications: I have reviewed the patient's current medications.  Results for orders placed or performed during the hospital encounter of 07/16/21 (from the past 48 hour(s))  Basic metabolic panel     Status: Abnormal   Collection Time: 07/16/21 10:59 AM   Result Value Ref Range   Sodium 139 135 - 145 mmol/L   Potassium 4.0 3.5 - 5.1 mmol/L   Chloride 100 98 - 111 mmol/L   CO2 28 22 - 32 mmol/L   Glucose, Bld 75 70 - 99 mg/dL    Comment: Glucose reference range applies only to samples taken after fasting for at least 8 hours.   BUN 22 8 - 23 mg/dL   Creatinine, Ser 4.49 (H) 0.44 - 1.00 mg/dL   Calcium 8.1 (L) 8.9 - 10.3 mg/dL   GFR, Estimated 11 (L) >60 mL/min    Comment: (NOTE) Calculated using the CKD-EPI Creatinine Equation (2021)    Anion gap 11 5 - 15    Comment: Performed at [facility] [address], [place], [place] [address]  Troponin I (High Sensitivity)     Status: None   Collection Time: 07/16/21 10:59 AM  Result Value Ref Range   Troponin I (High Sensitivity) 15 <18 ng/L    Comment: (NOTE) Elevated high sensitivity troponin I (hsTnI) values and significant  changes across serial measurements may suggest ACS but many other  chronic and acute conditions are known to elevate hsTnI results.  Refer to the "Links" section for chest pain algorithms and additional  guidance. Performed at [facility], [place] [address], [place], [place] [address]   CBC     Status: Abnormal   Collection Time: 07/16/21 10:59 AM  Result Value Ref Range   WBC 4.8 4.0 - 10.5 K/uL   RBC 2.38 (L) 3.87 - 5.11 MIL/uL   Hemoglobin 7.6 (L) 12.0 - 15.0 g/dL   HCT 25.7 (L) 36.0 - 46.0 %   MCV 108.0 (H) 80.0 - 100.0 fL   MCH 31.9 26.0 - 34.0 pg   MCHC 29.6 (L) 30.0 - 36.0 g/dL   RDW 17.4 (H) 11.5 - 15.5 %   Platelets 152 150 - 400 K/uL   nRBC 0.0 0.0 - 0.2 %    Comment: Performed at [facility] [address], [place], [place] [address]  Magnesium     Status: None   Collection Time: 07/16/21 10:59 AM  Result Value Ref Range   Magnesium 1.7 1.7 - 2.4 mg/dL    Comment: Performed at [facility] [address] [place], [place] [address]  CBG monitoring, ED     Status: None   Collection Time: 07/16/21 11:23 AM  Result  Value  Ref Range   Glucose-Capillary 73 70 - 99 mg/dL    Comment: Glucose reference range applies only to samples taken after fasting for at least 8 hours.  Troponin I (High Sensitivity)     Status: None   Collection Time: 07/16/21  1:16 PM  Result Value Ref Range   Troponin I (High Sensitivity) 17 <18 ng/L    Comment: (NOTE) Elevated high sensitivity troponin I (hsTnI) values and significant  changes across serial measurements may suggest ACS but many other  chronic and acute conditions are known to elevate hsTnI results.  Refer to the "Links" section for chest pain algorithms and additional  guidance. Performed at [facility], [place] [address] [place], [place] [address]   Lipid panel     Status: None   Collection Time: 07/16/21  1:16 PM  Result Value Ref Range   Cholesterol 131 0 - 200 mg/dL   Triglycerides 34 <150 mg/dL   HDL 55 >40 mg/dL   Total CHOL/HDL Ratio 2.4 RATIO   VLDL 7 0 - 40 mg/dL   LDL Cholesterol 69 0 - 99 mg/dL    Comment:        Total Cholesterol/HDL:CHD Risk Coronary Heart Disease Risk Table                     Men   Women  1/2 Average Risk   3.4   3.3  Average Risk       5.0   4.4  2 X Average Risk   9.6   7.1  3 X Average Risk  23.4   11.0        Use the calculated Patient Ratio above and the CHD Risk Table to determine the patient's CHD Risk.        ATP III CLASSIFICATION (LDL):  <100     mg/dL   Optimal  100-129  mg/dL   Near or Above                    Optimal  130-159  mg/dL   Borderline  160-189  mg/dL   High  >190     mg/dL   Very High Performed at [facility] [address], [place], [place] [address]   Hepatitis B surface antigen     Status: None   Collection Time: 07/16/21  4:08 PM  Result Value Ref Range   Hepatitis B Surface Ag NON REACTIVE NON REACTIVE    Comment: Performed at [facility] [address] [place], [place] [address]  Hepatitis B surface antibody     Status: Abnormal   Collection Time: 07/16/21   4:08 PM  Result Value Ref Range   Hep B S Ab Reactive (A) NON REACTIVE    Comment: (NOTE) Consistent with immunity, greater than 9.9 mIU/mL.  Performed at [facility], [place] [address] [place], [place] [address]   Hepatitis B surface antibody,quantitative     Status: None   Collection Time: 07/16/21  4:08 PM  Result Value Ref Range   Hepatitis B-Post 10.0 Immunity>9.9 mIU/mL    Comment: (NOTE) **Verified by repeat analysis**  Status of Immunity                     Anti-HBs Level  ------------------                     -------------- Inconsistent with Immunity  0.0 - 9.9 Consistent with Immunity                          >9.9 Performed At: [facility] [place], [place] [id number] Rush Farmer MD IO:[id number]   Hepatitis B core antibody, total     Status: None   Collection Time: 07/16/21  4:08 PM  Result Value Ref Range   Hep B Core Total Ab NON REACTIVE NON REACTIVE    Comment: Performed at [facility], [address] [place], [place] [address]  HIV Antibody (routine testing w rflx)     Status: None   Collection Time: 07/16/21  4:08 PM  Result Value Ref Range   HIV Screen 4th Generation wRfx Non Reactive Non Reactive    Comment: Performed at [facility], [place] [address] [place], [place] [address]  Folate     Status: None   Collection Time: 07/16/21  4:08 PM  Result Value Ref Range   Folate 36.4 >5.9 ng/mL    Comment: RESULT CONFIRMED BY AUTOMATED DILUTION Performed at [facility], [place] [address] [place], [place] [address]   Vitamin B12     Status: None   Collection Time: 07/16/21  4:08 PM  Result Value Ref Range   Vitamin B-12 512 180 - 914 pg/mL    Comment: (NOTE) This assay is not validated for testing neonatal or myeloproliferative syndrome specimens for Vitamin B12 levels. Performed at [facility], [place] [address], [place], [place] [address]   Iron and TIBC     Status: Abnormal    Collection Time: 07/16/21  4:08 PM  Result Value Ref Range   Iron 54 28 - 170 ug/dL   TIBC 182 (L) 250 - 450 ug/dL   Saturation Ratios 30 10.4 - 31.8 %   UIBC 128 ug/dL    Comment: Performed at [facility], [place] [address], [place], [place] [address]  Reticulocytes     Status: Abnormal   Collection Time: 07/16/21  4:08 PM  Result Value Ref Range   Retic Ct Pct 2.8 0.4 - 3.1 %   RBC. 2.30 (L) 3.87 - 5.11 MIL/uL   Retic Count, Absolute 63.7 19.0 - 186.0 K/uL   Immature Retic Fract 15.5 2.3 - 15.9 %    Comment: Performed at [facility] [address], [place], [place] [address]  Hepatitis C antibody     Status: None   Collection Time: 07/16/21  4:08 PM  Result Value Ref Range   HCV Ab NON REACTIVE NON REACTIVE    Comment: (NOTE) Nonreactive HCV antibody screen is consistent with no HCV infections,  unless recent infection is suspected or other evidence exists to indicate HCV infection.  Performed at [facility], [place] [address] [place], [place] [address]   CBC     Status: Abnormal   Collection Time: 07/17/21  5:00 AM  Result Value Ref Range   WBC 4.5 4.0 - 10.5 K/uL   RBC 2.27 (L) 3.87 - 5.11 MIL/uL   Hemoglobin 7.2 (L) 12.0 - 15.0 g/dL   HCT 24.1 (L) 36.0 - 46.0 %   MCV 106.2 (H) 80.0 - 100.0 fL   MCH 31.7 26.0 - 34.0 pg   MCHC 29.9 (L) 30.0 - 36.0 g/dL   RDW 17.2 (H) 11.5 - 15.5 %   Platelets 157 150 - 400 K/uL   nRBC 0.0 0.0 -  0.2 %    Comment: Performed at [facility], [place] [address], [place], [place] [address]  Basic metabolic panel     Status: Abnormal   Collection Time: 07/17/21  5:00 AM  Result Value Ref Range   Sodium 133 (L) 135 - 145 mmol/L   Potassium 3.3 (L) 3.5 - 5.1 mmol/L   Chloride 94 (L) 98 - 111 mmol/L   CO2 29 22 - 32 mmol/L   Glucose, Bld 80 70 - 99 mg/dL    Comment: Glucose reference range applies only to samples taken after fasting for at least 8 hours.   BUN 11 8 - 23 mg/dL   Creatinine, Ser 2.45 (H) 0.44 - 1.00 mg/dL     Comment: DELTA CHECK NOTED   Calcium 8.4 (L) 8.9 - 10.3 mg/dL   GFR, Estimated 22 (L) >60 mL/min    Comment: (NOTE) Calculated using the CKD-EPI Creatinine Equation (2021)    Anion gap 10 5 - 15    Comment: Performed at [facility] [address] [place], [place] [address]  Type and screen [place]     Status: None (Preliminary result)   Collection Time: 07/17/21  9:45 AM  Result Value Ref Range   ABO/RH(D) AB POS    Antibody Screen NEG    Sample Expiration 07/20/2021,2359    Unit Number [id number]    Blood Component Type RED CELLS,LR    Unit division 00    Status of Unit ISSUED    Transfusion Status OK TO TRANSFUSE    Crossmatch Result      Compatible Performed at [facility], [place] [address] [place], [place] [address]   Prepare RBC (crossmatch)     Status: None   Collection Time: 07/17/21  9:45 AM  Result Value Ref Range   Order Confirmation      ORDER PROCESSED BY BLOOD BANK Performed at [facility], [place] [address], [place], [place] [address]   Hemoglobin and hematocrit, blood     Status: Abnormal   Collection Time: 07/17/21  6:59 PM  Result Value Ref Range   Hemoglobin 7.7 (L) 12.0 - 15.0 g/dL   HCT 24.5 (L) 36.0 - 46.0 %    Comment: Performed at [facility], [place] [address], [place], [place] [address]  CBC     Status: Abnormal   Collection Time: 07/18/21  2:57 AM  Result Value Ref Range   WBC 4.8 4.0 - 10.5 K/uL   RBC 2.39 (L) 3.87 - 5.11 MIL/uL   Hemoglobin 7.5 (L) 12.0 - 15.0 g/dL   HCT 23.9 (L) 36.0 - 46.0 %   MCV 100.0 80.0 - 100.0 fL   MCH 31.4 26.0 - 34.0 pg   MCHC 31.4 30.0 - 36.0 g/dL   RDW 20.3 (H) 11.5 - 15.5 %   Platelets 164 150 - 400 K/uL   nRBC 0.0 0.0 - 0.2 %    Comment: Performed at [facility], [place] [address], [place],  [address]  Basic metabolic panel     Status: Abnormal   Collection Time: 07/18/21  2:57 AM  Result Value Ref Range   Sodium 131 (L) 135 - 145 mmol/L    Potassium 4.7 3.5 - 5.1 mmol/L    Comment: DELTA CHECK NOTED   Chloride 96 (L) 98 - 111 mmol/L   CO2 26 22 - 32 mmol/L   Glucose, Bld 97 70 - 99 mg/dL  Comment: Glucose reference range applies only to samples taken after fasting for at least 8 hours.   BUN 33 (H) 8 - 23 mg/dL   Creatinine, Ser 4.17 (H) 0.44 - 1.00 mg/dL    Comment: DELTA CHECK NOTED   Calcium 8.3 (L) 8.9 - 10.3 mg/dL   GFR, Estimated 12 (L) >60 mL/min    Comment: (NOTE) Calculated using the CKD-EPI Creatinine Equation (2021)    Anion gap 9 5 - 15    Comment: Performed at [facility] [address], [place], [place] [address]    ECHOCARDIOGRAM COMPLETE  Result Date: 07/17/2021    ECHOCARDIOGRAM REPORT   Patient Name:   BELEM HINTZE Date of Exam: 07/17/2021 Medical Rec #:  [id number]        Height:       63.0 in Accession #:    [id number]       Weight:       144.4 lb Date of Birth:  02/27/1960         BSA:          1.684 m Patient Age:    18 years         BP:           179/71 mmHg Patient Gender: F                HR:           71 bpm. Exam Location:  Inpatient Procedure: 2D Echo, Cardiac Doppler and Color Doppler Indications:     Chest pain  History:         Patient has prior history of Echocardiogram examinations, most                  recent 06/26/2021. CHF, CAD; Risk Factors:Hypertension, Diabetes                  and Sleep Apnea.  Sonographer:     Jefferey Pica Referring Phys:  [place] Diagnosing Phys: Gwyndolyn Kaufman MD IMPRESSIONS  1. Left ventricular ejection fraction, by estimation, is 65 to 70%. The left ventricle has normal function. The left ventricle has no regional wall motion abnormalities. There is severe concentric left ventricular hypertrophy. Left ventricular diastolic  parameters are consistent with Grade II diastolic dysfunction (pseudonormalization). Elevated left atrial pressure.  2. Right ventricular systolic function is mildly reduced. The right ventricular size is normal. There is  moderately elevated pulmonary artery systolic pressure. The estimated right ventricular systolic pressure is 01.0 mmHg which is higher than previous  TTE on 06/26/21 at 32mmHg. May be related to volume overload, however, PE is on the differential. Recommend clinical correlation.  3. Left atrial size was severely dilated.  4. Right atrial size was mildly dilated.  5. The mitral valve is degenerative. Mild mitral valve regurgitation. Severe mitral annular calcification.  6. Tricuspid valve regurgitation is mild to moderate.  7. The aortic valve is tricuspid. There is moderate calcification of the aortic valve. There is moderate thickening of the aortic valve. Aortic valve regurgitation is not visualized. Moderate aortic valve stenosis. Aortic valve mean gradient measures 36.0 mmHg. Aortic valve Vmax measures 4.11 m/s. AVA by continuity 1.4cm2, DI 0.67m/s. Suspect elevated mean gradient and Vmax in the setting of high output with LVOT VTI 40.4cm.  8. Aortic dilatation noted. There is borderline dilatation of the ascending aorta, measuring 39 mm.  9. The inferior vena cava is dilated in size with <50% respiratory variability, suggesting  right atrial pressure of 15 mmHg. Comparison(s): Compared to prior TTE on 06/26/21, the PASP is now 55.52mmHg from 34.57mmHg and the RV is mildly hypokinetic. Elevated PASP may be related to volume overload, however, PE is on the differential. Otherwise, there continues to be moderate AS with similar gradients. FINDINGS  Left Ventricle: Left ventricular ejection fraction, by estimation, is 65 to 70%. The left ventricle has normal function. The left ventricle has no regional wall motion abnormalities. The left ventricular internal cavity size was normal in size. There is  severe concentric left ventricular hypertrophy. Left ventricular diastolic parameters are consistent with Grade II diastolic dysfunction (pseudonormalization). Elevated left atrial pressure. Right Ventricle: The right  ventricular size is normal. No increase in right ventricular wall thickness. Right ventricular systolic function is mildly reduced. There is moderately elevated pulmonary artery systolic pressure. The tricuspid regurgitant velocity is 3.17 m/s, and with an assumed right atrial pressure of 15 mmHg, the estimated right ventricular systolic pressure is 78.4 mmHg. Left Atrium: Left atrial size was severely dilated. Right Atrium: Right atrial size was mildly dilated. Pericardium: There is no evidence of pericardial effusion. Mitral Valve: The mitral valve is degenerative in appearance. Severe mitral annular calcification. Mild mitral valve regurgitation. Tricuspid Valve: The tricuspid valve is normal in structure. Tricuspid valve regurgitation is mild to moderate. Aortic Valve: The aortic valve is tricuspid. There is moderate calcification of the aortic valve. There is moderate thickening of the aortic valve. Aortic valve regurgitation is not visualized. Moderate aortic stenosis is present. Aortic valve mean gradient measures 36.0 mmHg. Aortic valve peak gradient measures 67.6 mmHg. Aortic valve area, by VTI measures 1.33 cm. Pulmonic Valve: The pulmonic valve was normal in structure. Pulmonic valve regurgitation is trivial. Aorta: Aortic dilatation noted. There is borderline dilatation of the ascending aorta, measuring 39 mm. Venous: The inferior vena cava is dilated in size with less than 50% respiratory variability, suggesting right atrial pressure of 15 mmHg. IAS/Shunts: The atrial septum is grossly normal.  LEFT VENTRICLE PLAX 2D LVIDd:         4.50 cm   Diastology LVIDs:         2.80 cm   LV e' medial:    5.06 cm/s LV PW:         1.80 cm   LV E/e' medial:  35.2 LV IVS:        1.50 cm   LV e' lateral:   8.03 cm/s LVOT diam:     1.90 cm   LV E/e' lateral: 22.2 LV SV:         115 LV SV Index:   68 LVOT Area:     2.84 cm  RIGHT VENTRICLE            IVC RV Basal diam:  3.10 cm    IVC diam: 2.60 cm RV S prime:     5.12  cm/s TAPSE (M-mode): 1.2 cm LEFT ATRIUM              Index        RIGHT ATRIUM           Index LA diam:        4.80 cm  2.85 cm/m   RA Area:     19.50 cm LA Vol (A2C):   112.0 ml 66.52 ml/m  RA Volume:   53.70 ml  31.89 ml/m LA Vol (A4C):   103.0 ml 61.18 ml/m LA Biplane Vol: 109.0 ml 64.74 ml/m  AORTIC VALVE  PULMONIC VALVE AV Area (Vmax):    1.44 cm      PV Vmax:       0.80 m/s AV Area (Vmean):   1.55 cm      PV Peak grad:  2.6 mmHg AV Area (VTI):     1.33 cm AV Vmax:           411.00 cm/s AV Vmean:          262.000 cm/s AV VTI:            0.858 m AV Peak Grad:      67.6 mmHg AV Mean Grad:      36.0 mmHg LVOT Vmax:         209.00 cm/s LVOT Vmean:        143.000 cm/s LVOT VTI:          0.404 m LVOT/AV VTI ratio: 0.47  AORTA Ao Root diam: 3.10 cm Ao Asc diam:  3.90 cm MITRAL VALVE                TRICUSPID VALVE MV Area (PHT): 4.41 cm     TR Peak grad:   40.2 mmHg MV Decel Time: 172 msec     TR Vmax:        317.00 cm/s MR Peak grad: 71.2 mmHg MR Mean grad: 6.0 mmHg      SHUNTS MR Vmax:      422.00 cm/s   Systemic VTI:  0.40 m MR Vmean:     114.0 cm/s    Systemic Diam: 1.90 cm MV E velocity: 178.00 cm/s MV A velocity: 113.00 cm/s MV E/A ratio:  1.58 Gwyndolyn Kaufman MD Electronically signed by Gwyndolyn Kaufman MD Signature Date/Time: 07/17/2021/10:37:52 AM    Final (Updated)    DG Chest Portable 1 View  Result Date: 07/16/2021 CLINICAL DATA:  Chest pain that radiates to the back. History of CHF. EXAM: PORTABLE CHEST 1 VIEW COMPARISON:  Chest radiograph dated Jun 27, 2021 FINDINGS: The heart is enlarged. Evidence of prior coronary artery bypass grafting. Atherosclerotic calcification of the aortic arch. Prominence of the central pulmonary vessels. No appreciable pleural effusion or frank pulmonary edema. Sternotomy wires are intact. Partially imaged anterior cervical discectomy and fusion hardware. IMPRESSION: 1.  Stable cardiomegaly. 2. Pulmonary vascular congestion without evidence of  focal consolidation or pleural effusion. Electronically Signed   By: Keane Police D.O.   On: 07/16/2021 11:32    ROS feeling better from shortness of breath no longer having chest pain Blood pressure (!) 168/69, pulse 72, temperature 98.8 F (37.1 C), temperature source Oral, resp. rate 17, height 5\' 3"  (1.6 m), weight 67 kg, SpO2 99 %. Physical Exam vital signs stable afebrile no acute distress in good spirits exam pertinent for abdomen being soft nontender her hemoglobin is fairly stable she is not iron deficient on last iron study previous work-up reviewed  Assessment/Plan: Multiple medical problems including hemorrhoids bright red blood per rectum and guaiac positive and chronic anemia Plan: We will proceed with a capsule endoscopy and we discussed that procedure and she is agreeable and we will plan to do that tomorrow and the only prep is nothing after midnight except for her meds with sips with further work-up and plans pending those findings  [place] E 07/18/2021, 9:32 AM

## 2021-07-19 ENCOUNTER — Encounter (HOSPITAL_COMMUNITY)
Admission: EM | Disposition: A | Discharge status: Home / Self Care | Payer: Self-pay | Source: Home / Self Care | Attending: Internal Medicine

## 2021-07-19 ENCOUNTER — Encounter (HOSPITAL_COMMUNITY): Payer: Self-pay | Admitting: Gastroenterology

## 2021-07-19 DIAGNOSIS — R079 Chest pain, unspecified: Secondary | ICD-10-CM | POA: Diagnosis not present

## 2021-07-19 DIAGNOSIS — D649 Anemia, unspecified: Secondary | ICD-10-CM | POA: Diagnosis not present

## 2021-07-19 DIAGNOSIS — N186 End stage renal disease: Secondary | ICD-10-CM | POA: Diagnosis not present

## 2021-07-19 HISTORY — PX: GIVENS CAPSULE STUDY: SHX5432

## 2021-07-19 LAB — CBC
HCT: 22.6 % — ABNORMAL LOW (ref 36.0–46.0)
Hemoglobin: 7.1 g/dL — ABNORMAL LOW (ref 12.0–15.0)
MCH: 31.4 pg (ref 26.0–34.0)
MCHC: 31.4 g/dL (ref 30.0–36.0)
MCV: 100 fL (ref 80.0–100.0)
Platelets: 155 10*3/uL (ref 150–400)
RBC: 2.26 MIL/uL — ABNORMAL LOW (ref 3.87–5.11)
RDW: 19.7 % — ABNORMAL HIGH (ref 11.5–15.5)
WBC: 3.9 10*3/uL — ABNORMAL LOW (ref 4.0–10.5)
nRBC: 0 % (ref 0.0–0.2)

## 2021-07-19 LAB — BASIC METABOLIC PANEL
Anion gap: 11 (ref 5–15)
BUN: 26 mg/dL — ABNORMAL HIGH (ref 8–23)
CO2: 27 mmol/L (ref 22–32)
Calcium: 8.7 mg/dL — ABNORMAL LOW (ref 8.9–10.3)
Chloride: 95 mmol/L — ABNORMAL LOW (ref 98–111)
Creatinine, Ser: 3.49 mg/dL — ABNORMAL HIGH (ref 0.44–1.00)
GFR, Estimated: 14 mL/min — ABNORMAL LOW (ref >60)
Glucose, Bld: 114 mg/dL — ABNORMAL HIGH (ref 70–99)
Potassium: 4.4 mmol/L (ref 3.5–5.1)
Sodium: 133 mmol/L — ABNORMAL LOW (ref 135–145)

## 2021-07-19 LAB — PREPARE RBC (CROSSMATCH)

## 2021-07-19 LAB — HEMOGLOBIN AND HEMATOCRIT, BLOOD
HCT: 25.8 % — ABNORMAL LOW (ref 36.0–46.0)
Hemoglobin: 8 g/dL — ABNORMAL LOW (ref 12.0–15.0)

## 2021-07-19 SURGERY — IMAGING PROCEDURE, GI TRACT, INTRALUMINAL, VIA CAPSULE
Anesthesia: LOCAL

## 2021-07-19 MED ORDER — CETIRIZINE HCL 5 MG/5ML PO SOLN
5.0000 mg | Freq: Once | ORAL | Status: AC
Start: 2021-07-19 — End: 2021-07-19
  Administered 2021-07-19: 5 mg via ORAL
  Filled 2021-07-19: qty 5

## 2021-07-19 MED ORDER — SENNOSIDES-DOCUSATE SODIUM 8.6-50 MG PO TABS
1.0000 | ORAL_TABLET | Freq: Every evening | ORAL | Status: DC | PRN
  Administered 2021-07-21: 1 via ORAL
  Filled 2021-07-19 (×2): qty 1

## 2021-07-19 MED ORDER — CHLORHEXIDINE GLUCONATE CLOTH 2 % EX PADS
6.0000 | MEDICATED_PAD | Freq: Every day | CUTANEOUS | Status: DC
  Administered 2021-07-19 – 2021-07-22 (×4): 6 via TOPICAL

## 2021-07-19 MED ORDER — SODIUM CHLORIDE 0.9% IV SOLUTION: Freq: Once | INTRAVENOUS | Status: DC

## 2021-07-19 SURGICAL SUPPLY — 1 items: TOWEL COTTON PACK 4EA (MISCELLANEOUS) ×4 IMPLANT

## 2021-07-19 NOTE — Progress Notes (Signed)
Subjective:  Overnight Events: Nurse reports patient became hypertensive to 198/65 overnight, so she was given her hydralazine and Imdur that was scheduled for 10 am. No other acute events or concerns overnight.  Patient was seen and evaluated on rounds. She states that she is feeling well. She states that she swallowed the pill endoscopy camera this morning, and she is agreeable to blood transfusion today.  Objective:  Vital signs in last 24 hours: Vitals:   07/19/21 0300 07/19/21 0340 07/19/21 1059 07/19/21 1144  BP:  (!) 159/72 (!) 203/74 (!) 153/70  Pulse:   78 71  Resp:   19 14  Temp:  98.4 F (36.9 C)    TempSrc:  Oral    SpO2:  99%    Weight: 64.1 kg     Height:       Weight change: -1.4 kg  Intake/Output Summary (Last 24 hours) at 07/19/2021 1225 Last data filed at 07/19/2021 0422 Gross per 24 hour  Intake 240 ml  Output 0 ml  Net 240 ml    Physical Exam   Constitutional: well appearing and sitting in bed, in no acute distress HENT: normocephalic atraumatic, mucous membranes moist Eyes: pupils equal and round, conjunctiva non-erythematous Cardiovascular: regular rate with normal rhythm, 3/6 systolic murmur best heard over left upper sternal border, no rubs or gallops Pulmonary/Chest: normal work of breathing on room air, lungs clear to auscultation bilaterally Abdominal: soft, non-tender, non-distended, bowel sounds present MSK: normal bulk and tone, no peripheral edema Skin: warm and dry. Neurological: alert and answering questions appropriately. Psych: appropriate mood and affect   Assessment/Plan:  Principal Problem:   Chest pain Active Problems:   Essential hypertension   Melena   ESRD (end stage renal disease) (HCC)   Back pain  Patient Summary: Jodi Bell is a 61 y.o. female with past medical history of hypertension, type 2 diabetes mellitus, COPD on 3 L supplemental oxygen at baseline, ESRD on HD MWF initally presenting with chest pain (now  resolved) and continues to be hospitalized for acute anemia workup.   Hypertensive Emergency BP 198/65 overnight last night and received his AM dose of hydralazine and Imdur overnight. BP this morning is 159/72. Will continue to monitor. -HD today -Labetalol 400 mg TID -Amlodipine 5 mg QD -Hydralazine 100 mg TID -Clonidine 0.1 mg TID -Irbesartan 300 mg QD   Non cardiac chest pain  CAD History of NSTEMI with CABG x 2 in August 2022 Denies further chest pain. -Cardiology outpatient follow-up for stress test -antihypertensives as above   Internal hemorrhoids Diverticulosis Melena Hematochezia Acute on Chronic macrocytic Anemia Received 1 u prbc 2 days ago for hemoglobin 7.2. After transfusion, hemoglobin up to 7.7 that has now downtrended to 7.1. Plan to give another unit of blood. Source of bleeding suspected to be lower GI vs upper GI with reported hematochezia and melena. GI is following the patient and plan to take her for capsule endoscopy today. -Holding heparin for DVT prophylaxis. SCDs for now -GI consulted, greatly appreciate recs -Capsule endoscopy today   Pulmonary Vascular Congestion End Stage Renal Disease on MWF Hemodialysis ESRD MWF. Patient presented hypervolemic and has had multiple dialysis sessions during admission to remove fluid.  -Nephrology following, appreciate recommendations. -HD per nephro recs (short dialysis session today)   Chronic Hypoxemic Respiratory Failure COPD OSA (CPAP noncompliant) Patient with longstanding COPD on 2-3 L O2 at home -PRN albuterol -Home montelukast -Home tiotropium -PT/OT eval and treat   History of Type 2  Diabetes Mellitus Most recent A1C of 5.2 in August 2022. Not on diabetes medications at home.   GERD History of bleeding esophageal ulcer History of Esophagitis -Pantoprazole 40 mg qd   Gout -Continue home Allopurinol 100 mg.    Psoriatic Arthritis -Continue home Prednisone 5 mg   Insomnia -Continue home  ambien qhs prn   Diet: Clear liquids after capsule endoscopy per GI recs VTE: SCD IVF: None Code: Full   Prior to Admission Living Arrangement: Home Anticipated Discharge Location: TBD Barriers to Discharge: Medical Stability Dispo: Anticipated discharge in approximately 1-2 day(s).    LOS: 0 days   Elane Fritz, Medical Student 07/19/2021, 12:25 PM

## 2021-07-19 NOTE — Progress Notes (Signed)
OT Cancellation Note  Patient Details Name: TOSHIYE KEVER MRN: [id number] DOB: 1960/04/18   Cancelled Treatment:    Reason Eval/Treat Not Completed: Patient at procedure or test/ unavailable Patient off floor at HD, OT will follow back once back on the unit as time permits.  Corinne Ports E. Yohance Hathorne, OTR/L Acute Rehabilitation Services [phone]    Ascencion Dike 07/19/2021, 3:11 PM

## 2021-07-19 NOTE — Progress Notes (Signed)
Patient ID: [name], female   DOB: 1960/11/05, 61 y.o.   MRN: [id number] [facility] Progress Note   Assessment/ Plan:   1.  Hypoxic respiratory failure: Secondary to volume overload in combination with acute exacerbation of COPD.  Respiratory status back to baseline with ongoing chronic oxygen supplementation status post UF/HD. 2. ESRD: She is routinely on a Monday/Wednesday/Friday hemodialysis schedule and underwent hemodialysis yesterday to try and augment volume unloading-based on her symptoms, she appears to be back to her estimated dry weight.  We will do a short-term dialysis treatment today to get her back onto her outpatient hemodialysis schedule with minimal ultrafiltration of 0.5 to 1 L. 3. Anemia: Lower hemoglobin and hematocrit noted, no overt losses and will continue ESA.  Seen yesterday by gastroenterology after earlier evaluation revealed that she also had some external hemorrhoids.  Today undergoing capsule endoscopy. 4. CKD-MBD: Calcium level acceptable, monitor on phosphorus binder and Hectorol for PTH suppression. 5. Nutrition: Continue renal diet with ONS and fluid restriction. 6. Hypertension: Elevated blood pressure noted, reeducated on limiting interdialytic weight gain as well as adherence with antihypertensive therapy.  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  Labs: BMET Recent Labs  Lab 07/16/21 1059 07/17/21 0500 07/18/21 0257 07/19/21 0329  NA 139 133* 131* 133*  K 4.0 3.3* 4.7 4.4  CL 100 94* 96* 95*  CO2 28 29 26 27   GLUCOSE 75 80 97 114*  BUN 22 11 33* 26*  CREATININE 4.49* 2.45* 4.17* 3.49*  CALCIUM 8.1* 8.4* 8.3* 8.7*   CBC Recent Labs  Lab 07/16/21 1059 07/17/21 0500 07/17/21 1859 07/18/21 0257 07/19/21 0329  WBC 4.8 4.5  --  4.8 3.9*  HGB 7.6* 7.2* 7.7* 7.5* 7.1*  HCT 25.7* 24.1* 24.5* 23.9* 22.6*  MCV 108.0* 106.2*  --  100.0 100.0  PLT 152 157  --  164 155     Medications:     allopurinol  100 mg Oral QPM   amLODipine  10 mg Oral Daily   Chlorhexidine Gluconate Cloth  6 each Topical Q0600   Chlorhexidine Gluconate Cloth  6 each Topical Daily   cloNIDine  0.1 mg Oral TID   hydrALAZINE  100 mg Oral TID   hydrocortisone   Rectal BID   irbesartan  300 mg Oral Daily   isosorbide mononitrate  60 mg Oral Daily   labetalol  400 mg Oral TID   montelukast  10 mg Oral Q supper   pantoprazole  40 mg Oral Daily   predniSONE  5 mg Oral Q breakfast   umeclidinium bromide  1 puff Inhalation Daily   Elmarie Shiley, MD 07/19/2021, 8:28 AM

## 2021-07-19 NOTE — Progress Notes (Signed)
[facility] Gastroenterology Progress Note  Jodi Bell 61 y.o. 1960/06/27  CC:  Anemia, guaiac positive   Subjective: Patient resting in bed, in the process of being transported for dialysis at time of my visit. Capsule endoscopy in process and patient denies nausea, vomiting, abdominal pain, or evidence of GI bleeding today including melena or hematochezia.   ROS : Review of Systems  Constitutional:  Negative for chills and fever.  Gastrointestinal:  Negative for abdominal pain, blood in stool, melena, nausea and vomiting.      Objective: Vital signs in last 24 hours: Vitals:   07/19/21 1335 07/19/21 1400  BP: (!) 132/58 139/76  Pulse: 65 68  Resp: 19 14  Temp:    SpO2:  100%    Physical Exam:  General:  Alert, cooperative, no distress, appears stated age  Head:  Normocephalic, without obvious abnormality, atraumatic  Eyes:  EOM's intact  Lungs:   Respirations unlabored                Lab Results: Recent Labs    07/18/21 0257 07/19/21 0329  NA 131* 133*  K 4.7 4.4  CL 96* 95*  CO2 26 27  GLUCOSE 97 114*  BUN 33* 26*  CREATININE 4.17* 3.49*  CALCIUM 8.3* 8.7*   No results for input(s): "AST", "ALT", "ALKPHOS", "BILITOT", "PROT", "ALBUMIN" in the last 72 hours. Recent Labs    07/18/21 0257 07/19/21 0329  WBC 4.8 3.9*  HGB 7.5* 7.1*  HCT 23.9* 22.6*  MCV 100.0 100.0  PLT 164 155   No results for input(s): "LABPROT", "INR" in the last 72 hours.    Assessment Acute on chronic anemia - Capsule endoscopy in process - Hemoglobin 7.1, stable.  Normal MCV - BUN 26, creatinine 3.49, improving - Extensive work-up 09/2020 including upper endoscopy and colonoscopy, which revealed internal hemorrhoids, diverticulosis, no active or old bleeding.  Plan: Dr. Watt Climes will follow-up on capsule endoscopy tomorrow. Continue supportive care. Continue daily CBC and transfuse as needed to maintain HGB > 7.  [facility] will follow.  Angelique Holm PA-C 07/19/2021, 2:39  PM  Contact #  [phone]

## 2021-07-19 NOTE — Progress Notes (Signed)
Pill capsule ingested @ 0800 with no issues. Instructions reviewed with bed side RN and patient. Verbalized understanding,.

## 2021-07-19 NOTE — Progress Notes (Signed)
Mobility Specialist Progress Note:   07/19/21 0943  Mobility  Activity Refused mobility   Will follow-up as time allows.   [facility] Dailah Opperman Mobility Specialist

## 2021-07-20 DIAGNOSIS — N186 End stage renal disease: Secondary | ICD-10-CM | POA: Diagnosis not present

## 2021-07-20 DIAGNOSIS — I132 Hypertensive heart and chronic kidney disease with heart failure and with stage 5 chronic kidney disease, or end stage renal disease: Secondary | ICD-10-CM | POA: Diagnosis not present

## 2021-07-20 DIAGNOSIS — K921 Melena: Secondary | ICD-10-CM

## 2021-07-20 DIAGNOSIS — J9611 Chronic respiratory failure with hypoxia: Secondary | ICD-10-CM | POA: Diagnosis not present

## 2021-07-20 DIAGNOSIS — K2971 Gastritis, unspecified, with bleeding: Secondary | ICD-10-CM | POA: Diagnosis not present

## 2021-07-20 LAB — TROPONIN I (HIGH SENSITIVITY): Troponin I (High Sensitivity): 14 ng/L (ref <18)

## 2021-07-20 LAB — CBC
HCT: 23.9 % — ABNORMAL LOW (ref 36.0–46.0)
Hemoglobin: 7.6 g/dL — ABNORMAL LOW (ref 12.0–15.0)
MCH: 31.1 pg (ref 26.0–34.0)
MCHC: 31.8 g/dL (ref 30.0–36.0)
MCV: 98 fL (ref 80.0–100.0)
Platelets: 157 10*3/uL (ref 150–400)
RBC: 2.44 MIL/uL — ABNORMAL LOW (ref 3.87–5.11)
RDW: 20.1 % — ABNORMAL HIGH (ref 11.5–15.5)
WBC: 3.7 10*3/uL — ABNORMAL LOW (ref 4.0–10.5)
nRBC: 0 % (ref 0.0–0.2)

## 2021-07-20 LAB — BASIC METABOLIC PANEL
Anion gap: 5 (ref 5–15)
BUN: 20 mg/dL (ref 8–23)
CO2: 30 mmol/L (ref 22–32)
Calcium: 8.7 mg/dL — ABNORMAL LOW (ref 8.9–10.3)
Chloride: 97 mmol/L — ABNORMAL LOW (ref 98–111)
Creatinine, Ser: 3.15 mg/dL — ABNORMAL HIGH (ref 0.44–1.00)
GFR, Estimated: 16 mL/min — ABNORMAL LOW (ref >60)
Glucose, Bld: 110 mg/dL — ABNORMAL HIGH (ref 70–99)
Potassium: 3.7 mmol/L (ref 3.5–5.1)
Sodium: 132 mmol/L — ABNORMAL LOW (ref 135–145)

## 2021-07-20 LAB — TYPE AND SCREEN
ABO/RH(D): AB POS
Antibody Screen: NEGATIVE
Unit division: 0
Unit division: 0

## 2021-07-20 LAB — BPAM RBC
Blood Product Expiration Date: 202306152359
Blood Product Expiration Date: 202306152359
ISSUE DATE / TIME: 202306071406
ISSUE DATE / TIME: 202306091438
Unit Type and Rh: 8400
Unit Type and Rh: 8400

## 2021-07-20 MED ORDER — LORATADINE 10 MG PO TABS
10.0000 mg | ORAL_TABLET | Freq: Every day | ORAL | Status: DC | PRN
  Administered 2021-07-20: 10 mg via ORAL
  Filled 2021-07-20: qty 1

## 2021-07-20 MED ORDER — LIDOCAINE VISCOUS HCL 2 % MT SOLN
15.0000 mL | Freq: Once | OROMUCOSAL | Status: AC
  Administered 2021-07-20: 15 mL via ORAL
  Filled 2021-07-20: qty 15

## 2021-07-20 MED ORDER — FLUTICASONE PROPIONATE 50 MCG/ACT NA SUSP
1.0000 | Freq: Every day | NASAL | Status: DC | PRN
  Filled 2021-07-20: qty 16

## 2021-07-20 MED ORDER — SALINE SPRAY 0.65 % NA SOLN
1.0000 | NASAL | Status: DC | PRN
  Filled 2021-07-20 (×2): qty 44

## 2021-07-20 MED ORDER — LIDOCAINE 5 % EX PTCH
1.0000 | MEDICATED_PATCH | Freq: Every day | CUTANEOUS | Status: DC | PRN
  Administered 2021-07-20 – 2021-07-22 (×3): 1 via TRANSDERMAL
  Filled 2021-07-20 (×3): qty 1

## 2021-07-20 MED ORDER — ALUM & MAG HYDROXIDE-SIMETH 200-200-20 MG/5ML PO SUSP
30.0000 mL | Freq: Once | ORAL | Status: AC
  Administered 2021-07-20: 30 mL via ORAL
  Filled 2021-07-20: qty 30

## 2021-07-20 MED ORDER — CETIRIZINE HCL 10 MG PO TABS
5.0000 mg | ORAL_TABLET | Freq: Once | ORAL | Status: AC | PRN
  Administered 2021-07-20: 5 mg via ORAL
  Filled 2021-07-20: qty 1

## 2021-07-20 NOTE — Progress Notes (Addendum)
Patient ID: Jodi Bell, female   DOB: [birth year], 61 y.o.   MRN: [id number] [facility] Progress Note   Assessment/ Plan:   1.  Hypoxic respiratory failure: Secondary to volume overload in combination with acute exacerbation of COPD.  Respiratory status back to baseline with ongoing chronic oxygen supplementation status post UF/HD. 2. ESRD: She is routinely on a Monday/Wednesday/Friday hemodialysis schedule and underwent a truncated hemodialysis yesterday to get her back onto her usual outpatient schedule after dialysis on Thursday for volume unloading.  Clinically, she appears to be back to her estimated dry weight.   3. Anemia: Lower hemoglobin and hematocrit noted, no overt losses and will continue ESA.  She underwent capsule endoscopy yesterday and had 1 unit PRBC transfusion overnight. 4. CKD-MBD: Calcium level acceptable, monitor on phosphorus binder and Hectorol for PTH suppression. 5. Nutrition: Continue renal diet with ONS and fluid restriction. 6. Hypertension: Elevated blood pressure noted, reeducated on limiting interdialytic weight gain as well as adherence with antihypertensive therapy.  Subjective:   Reports that she has not passed her endoscopy capsule and has requested for some Senokot because she is constipated from pain medications.   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  Labs: BMET Recent Labs  Lab 07/16/21 1059 07/17/21 0500 07/18/21 0257 07/19/21 0329 07/20/21 0201  NA 139 133* 131* 133* 132*  K  4.0 3.3* 4.7 4.4 3.7  CL 100 94* 96* 95* 97*  CO2 28 29 26 27 30   GLUCOSE 75 80 97 114* 110*  BUN 22 11 33* 26* 20  CREATININE 4.49* 2.45* 4.17* 3.49* 3.15*  CALCIUM 8.1* 8.4* 8.3* 8.7* 8.7*   CBC Recent Labs  Lab 07/17/21 0500 07/17/21 1859 07/18/21 0257 07/19/21 0329 07/19/21 1757 07/20/21 0201  WBC 4.5  --  4.8 3.9*  --  3.7*  HGB 7.2*   < > 7.5* 7.1* 8.0* 7.6*  HCT 24.1*   < > 23.9* 22.6* 25.8* 23.9*  MCV 106.2*  --  100.0 100.0  --  98.0  PLT 157  --  164 155  --  157   < > = values in this interval not displayed.     Medications:     sodium chloride   Intravenous Once   sodium chloride   Intravenous Once   allopurinol  100 mg Oral QPM   amLODipine  10 mg Oral Daily   Chlorhexidine Gluconate Cloth  6 each Topical Q0600   Chlorhexidine Gluconate Cloth  6 each Topical Daily   cloNIDine  0.1 mg Oral TID   hydrALAZINE  100 mg Oral TID   hydrocortisone   Rectal BID   irbesartan  300 mg Oral Daily   isosorbide mononitrate  60 mg Oral Daily   labetalol  400 mg Oral TID   montelukast  10 mg Oral Q supper   pantoprazole  40 mg Oral Daily   predniSONE  5 mg Oral Q breakfast   umeclidinium  bromide  1 puff Inhalation Daily   Elmarie Shiley, MD 07/20/2021, 8:19 AM

## 2021-07-20 NOTE — Progress Notes (Signed)
Mobility Specialist Progress Note    07/20/21 1547  Mobility  Activity Ambulated with assistance in hallway  Level of Assistance Contact guard assist, steadying assist  Assistive Device Other (Comment) (railings)  Distance Ambulated (ft) 250 ft  Activity Response Tolerated well  $Mobility charge 1 Mobility   Pre-Mobility: 79 HR Post-Mobility: 81 HR  Pt received in bed and agreeable. No complaints on walk. Ambulated on 3LO2. Returned to sitting EOB with call bell in reach.    Hildred Alamin Mobility Specialist

## 2021-07-20 NOTE — H&P (View-Only) (Signed)
Jodi Bell 2:55 PM  Subjective: Patient without any GI issues and we discussed her capsule endoscopy and her stools are still dark which is not surprising based on old blood in the colon on capsule and we discussed enteroscope on Monday and she has no other complaints  Objective: Vital signs stable afebrile no acute distress abdomen is soft nontender BUN and creatinine a little lower hemoglobin fairly stable although she did get 1 unit of blood according to the chart Assessment: Positive capsule endoscopy for 1 or 2 areas of the small bowel mid and distal with minimal bleeding  Plan: We will plan on enteroscope by Dr. Deno Etienne on Monday we will check labs on computer tomorrow but please call me if any question or problem from a GI standpoint  [facility] E  office [phone] After 5PM or if no answer call [phone]

## 2021-07-20 NOTE — Procedures (Signed)
Capsule endoscopy showed minimal fresh blood in the mid small bowel with some old blood in the mid small bowel and then a normal area of small bowel and then a second distal small bowel slightly redder and old dark blood in the colon that had seemingly old dark blood as well and other than some mild gastritis no other abnormalities were seen please see capsule endoscopy report for details and pictures and will plan an enteroscope on Monday but she may ultimately need single or double-balloon endoscopy

## 2021-07-20 NOTE — Progress Notes (Signed)
BP (!) 177/74 (BP Location: Right Arm)   Pulse 72   Temp 99 F (37.2 C) (Oral)   Resp 16   Ht 5\' 3"  (1.6 m)   Wt 66.1 kg   SpO2 100%   BMI 25.81 kg/m  BP is high, pt does not want to recheck her BP. She wants to get  her weight before breakfast. Will pass on day shift.

## 2021-07-20 NOTE — Progress Notes (Addendum)
Subjective:  Overnight Events: No acute events or concerns overnight.  Patient was seen and evaluated on rounds. She states that she is feeling well. No complaints of pain or shortness of breath. She states that she did not have a bowel movement yesterday, and she was given Senna for this overnight.   Objective:  Vital signs in last 24 hours: Vitals:   07/19/21 1658 07/19/21 1914 07/20/21 0432 07/20/21 0450  BP: (!) 149/74 (!) 150/74 (!) 177/74   Pulse: 81 75 72   Resp: 19 19 16    Temp:  99 F (37.2 C) 99 F (37.2 C)   TempSrc:  Oral Oral   SpO2:  94% 100%   Weight:    63.9 kg  Height:       Weight change: 1.1 kg  Intake/Output Summary (Last 24 hours) at 07/20/2021 3875 Last data filed at 07/19/2021 1900 Gross per 24 hour  Intake 1437 ml  Output --  Net 1437 ml     Physical Exam   Constitutional: well appearing and sitting in bed, in no acute distress HENT: normocephalic atraumatic, mucous membranes moist Eyes: pupils equal and round, conjunctiva non-erythematous Cardiovascular: regular rate with normal rhythm, 3/6 systolic murmur best heard over left upper sternal border, no rubs or gallops Pulmonary/Chest: normal work of breathing on room air, lungs clear to auscultation bilaterally Abdominal: soft, non-tender, non-distended, bowel sounds present MSK: normal bulk and tone, no peripheral edema Skin: warm and dry. Neurological: alert and answering questions appropriately. Psych: appropriate mood and affect   Assessment/Plan:  Principal Problem:   Melena Active Problems:   Essential hypertension   Anemia   ESRD (end stage renal disease) (HCC)   Chest pain   Back pain  Patient Summary: Jodi Bell is a 61 y.o. female with past medical history of hypertension, type 2 diabetes mellitus, COPD on 3 L supplemental oxygen at baseline, ESRD on HD MWF initally presenting with chest pain (now resolved) and continues to be hospitalized for acute anemia workup.    Hypertensive Emergency BP this morning is 177/74. Will continue to monitor. -Labetalol 400 mg TID -Amlodipine 5 mg QD -Hydralazine 100 mg TID -Clonidine 0.1 mg TID -Irbesartan 300 mg QD   Non cardiac chest pain  CAD History of NSTEMI with CABG x 2 in August 2022 Denies further chest pain. -Cardiology outpatient follow-up for stress test -antihypertensives as above   Internal hemorrhoids Diverticulosis Melena Hematochezia Acute on Chronic macrocytic Anemia Received 1 u prbc yesterday for hemoglobin 7.1. Post H&H 8.0, AM Hgb 7.6. Capsule endoscopy. -Holding heparin for DVT prophylaxis. SCDs for now -GI consulted, greatly appreciate recs -Capsule endoscopy yesterday, should result today -H&H this afternoon   Pulmonary Vascular Congestion End Stage Renal Disease on MWF Hemodialysis ESRD MWF. Patient presented hypervolemic and has had multiple dialysis sessions during admission to remove fluid.  -Nephrology following, appreciate recommendations. -HD per nephro recs (next session Monday)   Chronic Hypoxemic Respiratory Failure COPD OSA (CPAP noncompliant) Patient with longstanding COPD on 2-3 L O2 at home -PRN albuterol -Home montelukast -Home tiotropium -PT/OT eval and treat   History of Type 2 Diabetes Mellitus Most recent A1C of 5.2 in August 2022. Not on diabetes medications at home.   GERD History of bleeding esophageal ulcer History of Esophagitis -Pantoprazole 40 mg qd   Gout -Continue home Allopurinol 100 mg.    Psoriatic Arthritis -Continue home Prednisone 5 mg   Insomnia -Continue home ambien qhs prn  Anxiety -Continue home ativan 0.5  mg q8 prn  Chronic Lumbar Pain -Continue home oxy 10 q12 hr   Diet: Full VTE: SCD IVF: None Code: Full   Prior to Admission Living Arrangement: Home Anticipated Discharge Location: TBD Barriers to Discharge: Medical Stability Dispo: Anticipated discharge in approximately 1-2 day(s).   LOS: 3  days  Elane Fritz, Medical Student 07/20/2021, 7:23 AM   Attestation for Student Documentation:  I personally was present and performed or re-performed the history, physical exam and medical decision-making activities of this service and have verified that the service and findings are accurately documented in the student's note.  [name], MD 07/20/2021, 10:35 AM

## 2021-07-20 NOTE — Progress Notes (Signed)
Jodi Bell 2:55 PM  Subjective: Patient without any GI issues and we discussed her capsule endoscopy and her stools are still dark which is not surprising based on old blood in the colon on capsule and we discussed enteroscope on Monday and she has no other complaints  Objective: Vital signs stable afebrile no acute distress abdomen is soft nontender BUN and creatinine a little lower hemoglobin fairly stable although she did get 1 unit of blood according to the chart Assessment: Positive capsule endoscopy for 1 or 2 areas of the small bowel mid and distal with minimal bleeding  Plan: We will plan on enteroscope by Dr. Deno Etienne on Monday we will check labs on computer tomorrow but please call me if any question or problem from a GI standpoint  [facility]  office [phone] After 5PM or if no answer call [phone]

## 2021-07-21 DIAGNOSIS — X58XXXA Exposure to other specified factors, initial encounter: Secondary | ICD-10-CM | POA: Diagnosis present

## 2021-07-21 DIAGNOSIS — I509 Heart failure, unspecified: Secondary | ICD-10-CM | POA: Diagnosis not present

## 2021-07-21 DIAGNOSIS — Z992 Dependence on renal dialysis: Secondary | ICD-10-CM | POA: Diagnosis not present

## 2021-07-21 DIAGNOSIS — I132 Hypertensive heart and chronic kidney disease with heart failure and with stage 5 chronic kidney disease, or end stage renal disease: Secondary | ICD-10-CM | POA: Diagnosis present

## 2021-07-21 DIAGNOSIS — L4050 Arthropathic psoriasis, unspecified: Secondary | ICD-10-CM | POA: Diagnosis present

## 2021-07-21 DIAGNOSIS — E1122 Type 2 diabetes mellitus with diabetic chronic kidney disease: Secondary | ICD-10-CM | POA: Diagnosis present

## 2021-07-21 DIAGNOSIS — I5032 Chronic diastolic (congestive) heart failure: Secondary | ICD-10-CM | POA: Diagnosis present

## 2021-07-21 DIAGNOSIS — N2581 Secondary hyperparathyroidism of renal origin: Secondary | ICD-10-CM | POA: Diagnosis present

## 2021-07-21 DIAGNOSIS — N186 End stage renal disease: Secondary | ICD-10-CM | POA: Diagnosis present

## 2021-07-21 DIAGNOSIS — I161 Hypertensive emergency: Secondary | ICD-10-CM | POA: Diagnosis present

## 2021-07-21 DIAGNOSIS — I25110 Atherosclerotic heart disease of native coronary artery with unstable angina pectoris: Secondary | ICD-10-CM | POA: Diagnosis present

## 2021-07-21 DIAGNOSIS — T182XXA Foreign body in stomach, initial encounter: Secondary | ICD-10-CM | POA: Diagnosis present

## 2021-07-21 DIAGNOSIS — K648 Other hemorrhoids: Secondary | ICD-10-CM | POA: Diagnosis present

## 2021-07-21 DIAGNOSIS — J449 Chronic obstructive pulmonary disease, unspecified: Secondary | ICD-10-CM | POA: Diagnosis present

## 2021-07-21 DIAGNOSIS — K2971 Gastritis, unspecified, with bleeding: Secondary | ICD-10-CM | POA: Diagnosis present

## 2021-07-21 DIAGNOSIS — I2510 Atherosclerotic heart disease of native coronary artery without angina pectoris: Secondary | ICD-10-CM | POA: Diagnosis not present

## 2021-07-21 DIAGNOSIS — K921 Melena: Secondary | ICD-10-CM | POA: Diagnosis not present

## 2021-07-21 DIAGNOSIS — D631 Anemia in chronic kidney disease: Secondary | ICD-10-CM | POA: Diagnosis present

## 2021-07-21 DIAGNOSIS — R079 Chest pain, unspecified: Secondary | ICD-10-CM | POA: Diagnosis not present

## 2021-07-21 DIAGNOSIS — D539 Nutritional anemia, unspecified: Secondary | ICD-10-CM | POA: Diagnosis present

## 2021-07-21 DIAGNOSIS — I110 Hypertensive heart disease with heart failure: Secondary | ICD-10-CM | POA: Diagnosis not present

## 2021-07-21 DIAGNOSIS — K644 Residual hemorrhoidal skin tags: Secondary | ICD-10-CM | POA: Diagnosis present

## 2021-07-21 DIAGNOSIS — K922 Gastrointestinal hemorrhage, unspecified: Secondary | ICD-10-CM | POA: Diagnosis present

## 2021-07-21 DIAGNOSIS — R933 Abnormal findings on diagnostic imaging of other parts of digestive tract: Secondary | ICD-10-CM | POA: Diagnosis present

## 2021-07-21 DIAGNOSIS — J9611 Chronic respiratory failure with hypoxia: Secondary | ICD-10-CM | POA: Diagnosis present

## 2021-07-21 LAB — CBC
HCT: 23.6 % — ABNORMAL LOW (ref 36.0–46.0)
Hemoglobin: 7.4 g/dL — ABNORMAL LOW (ref 12.0–15.0)
MCH: 31 pg (ref 26.0–34.0)
MCHC: 31.4 g/dL (ref 30.0–36.0)
MCV: 98.7 fL (ref 80.0–100.0)
Platelets: 166 10*3/uL (ref 150–400)
RBC: 2.39 MIL/uL — ABNORMAL LOW (ref 3.87–5.11)
RDW: 19.7 % — ABNORMAL HIGH (ref 11.5–15.5)
WBC: 4.3 10*3/uL (ref 4.0–10.5)
nRBC: 0 % (ref 0.0–0.2)

## 2021-07-21 LAB — BASIC METABOLIC PANEL
Anion gap: 11 (ref 5–15)
BUN: 35 mg/dL — ABNORMAL HIGH (ref 8–23)
CO2: 27 mmol/L (ref 22–32)
Calcium: 8.9 mg/dL (ref 8.9–10.3)
Chloride: 95 mmol/L — ABNORMAL LOW (ref 98–111)
Creatinine, Ser: 4.99 mg/dL — ABNORMAL HIGH (ref 0.44–1.00)
GFR, Estimated: 9 mL/min — ABNORMAL LOW (ref >60)
Glucose, Bld: 110 mg/dL — ABNORMAL HIGH (ref 70–99)
Potassium: 4.2 mmol/L (ref 3.5–5.1)
Sodium: 133 mmol/L — ABNORMAL LOW (ref 135–145)

## 2021-07-21 LAB — HEMOGLOBIN AND HEMATOCRIT, BLOOD
HCT: 24.5 % — ABNORMAL LOW (ref 36.0–46.0)
Hemoglobin: 7.8 g/dL — ABNORMAL LOW (ref 12.0–15.0)

## 2021-07-21 LAB — TROPONIN I (HIGH SENSITIVITY): Troponin I (High Sensitivity): 16 ng/L (ref <18)

## 2021-07-21 MED ORDER — CETIRIZINE HCL 10 MG PO TABS: 5.0000 mg | ORAL_TABLET | Freq: Every day | ORAL | Status: DC

## 2021-07-21 MED ORDER — CETIRIZINE HCL 10 MG PO TABS
10.0000 mg | ORAL_TABLET | Freq: Every day | ORAL | Status: DC
  Administered 2021-07-21 – 2021-07-22 (×2): 10 mg via ORAL
  Filled 2021-07-21 (×3): qty 1

## 2021-07-21 MED ORDER — LORATADINE 10 MG PO TABS
10.0000 mg | ORAL_TABLET | Freq: Every day | ORAL | Status: DC
  Filled 2021-07-21: qty 1

## 2021-07-21 NOTE — Progress Notes (Addendum)
HD#0 Subjective:  Overnight Events: Intermittent pinching chest pain that could be reproduced on palpation. No other events overnight.   Patient seen and assessed at bedside. Endorsed persistent melena. No hematochezia. Endorses chronic low back pain but no acute problems since yesterday.   Objective:  Vital signs in last 24 hours: Vitals:   07/20/21 1631 07/20/21 2010 07/20/21 2100 07/21/21 0415  BP: (!) 157/68 (!) 150/65 (!) 157/74 (!) 160/61  Pulse: 77   66  Resp: 15 17 19 19   Temp:  98.8 F (37.1 C)  98 F (36.7 C)  TempSrc:  Oral  Oral  SpO2:    98%  Weight:      Height:       Supplemental O2: Nasal Cannula SpO2: 98 % O2 Flow Rate (L/min): 2 L/min   Physical Exam:  Physical Exam Constitutional:      General: She is not in acute distress.    Appearance: She is normal weight. She is not ill-appearing.  HENT:     Head: Normocephalic and atraumatic.  Cardiovascular:     Rate and Rhythm: Normal rate and regular rhythm.     Heart sounds: Murmur heard.     Comments: Diastolic murmur Pulmonary:     Effort: Pulmonary effort is normal. No tachypnea or respiratory distress.  Skin:    General: Skin is warm and dry.     Capillary Refill: Capillary refill takes less than 2 seconds.  Neurological:     General: No focal deficit present.     Mental Status: She is alert and oriented to person, place, and time.  Psychiatric:        Mood and Affect: Mood normal.        Behavior: Behavior normal.     Filed Weights   07/19/21 1334 07/19/21 1635 07/20/21 0450  Weight: 66.7 kg 66.1 kg 63.9 kg     Intake/Output Summary (Last 24 hours) at 07/21/2021 0620 Last data filed at 07/21/2021 0600 Gross per 24 hour  Intake 420 ml  Output --  Net 420 ml    Net IO Since Admission: 1,341.22 mL [07/21/21 0620]  Pertinent Labs:    Latest Ref Rng & Units 07/20/2021   11:29 PM 07/20/2021    2:01 AM 07/19/2021    5:57 PM  CBC  WBC 4.0 - 10.5 K/uL 4.3  3.7    Hemoglobin 12.0 -  15.0 g/dL 7.4  7.6  8.0   Hematocrit 36.0 - 46.0 % 23.6  23.9  25.8   Platelets 150 - 400 K/uL 166  157         Latest Ref Rng & Units 07/20/2021   11:29 PM 07/20/2021    2:01 AM 07/19/2021    3:29 AM  CMP  Glucose 70 - 99 mg/dL 110  110  114   BUN 8 - 23 mg/dL 35  20  26   Creatinine 0.44 - 1.00 mg/dL 4.99  3.15  3.49   Sodium 135 - 145 mmol/L 133  132  133   Potassium 3.5 - 5.1 mmol/L 4.2  3.7  4.4   Chloride 98 - 111 mmol/L 95  97  95   CO2 22 - 32 mmol/L 27  30  27    Calcium 8.9 - 10.3 mg/dL 8.9  8.7  8.7     Imaging: No results found.  Assessment/Plan:    Patient Summary: Jodi Bell is a 61 y.o. female with past medical history of hypertension, type 2 diabetes  mellitus, COPD on 3 L supplemental oxygen at baseline, ESRD on HD MWF initally presenting with chest pain (now resolved) and continues to be hospitalized for GI Bleed  GI Bleeding Acute on Chronic macrocytic Anemia Hgb 7.4>7.8. Positive capsule endoscopy in 1-2 areas of small bowel with minimal bleeding. -GI consulted, greatly appreciate recs -Enteroscope by Dr. Deno Etienne on Monday, appreciate assistance with patient's care -Transfusion threshold <7  Chronic Severe Asymptomatic Hypertension BP 160/61. Will continue to monitor. -HD per nephro recs -Labetalol 400 mg TID -Amlodipine 10 mg QD -Hydralazine 100 mg TID -Clonidine 0.1 mg TID -Irbesartan 300 mg QD  Non cardiac chest pain  CAD History of NSTEMI with CABG x 2 in August 2022 -Cardiology outpatient follow-up for stress test -antihypertensives as above   Pulmonary Vascular Congestion End Stage Renal Disease on MWF Hemodialysis ESRD MWF. -Nephrology following, appreciate recommendations. -HD per nephro recs   Chronic Hypoxemic Respiratory Failure COPD OSA (CPAP noncompliant) Patient with longstanding COPD on 2-3 L O2 at home -PRN albuterol -Home montelukast -Home tiotropium -PT/OT eval and treat   History of Type 2 Diabetes Mellitus Most  recent A1C of 5.2 in August 2022. Not on diabetes medications at home.   GERD History of bleeding esophageal ulcer History of Esophagitis -Pantoprazole 40 mg qd   Gout -Continue home Allopurinol 100 mg.    Psoriatic Arthritis -Continue home Prednisone 5 mg   Insomnia -Continue home ambien qhs prn  Chronic Lumbar Pain -Continue home oxy 10 q12 hr prn  Seasonal Allergies -Cetirizine 5 mg qd per patient request   Diet: Low sodium diet VTE: SCD IVF: None Code: Full   Prior to Admission Living Arrangement: Home Anticipated Discharge Location: TBD Barriers to Discharge: Medical Stability Dispo: Anticipated discharge in approximately 1-2 day(s).   [name], MD 07/21/2021, 6:20 AM Pager: [phone]  Please contact the on call pager after 5 pm and on weekends at [phone].

## 2021-07-21 NOTE — Progress Notes (Signed)
Patient ID: Jodi Bell, female   DOB: [birth year], 61 y.o.   MRN: [id number] [facility] Progress Note   Assessment/ Plan:   1.  Hypoxic respiratory failure: Secondary to volume overload in combination with acute exacerbation of COPD.  Respiratory status has improved and she is back to baseline with ongoing oxygen supplementation via nasal cannula (suspect this might have been compounded by her anemia as well). 2. ESRD: She is routinely on a Monday/Wednesday/Friday hemodialysis schedule and will order for hemodialysis again tomorrow (later in the day to not interrupt enteroscopy schedule) electively as she does not have any acute indications for dialysis today.  3. Anemia: Hemoglobin and hematocrit appear essentially stable yesterday, pending from this morning.  Report of capsule endoscopy noted with mild gastritis/old blood in mid small bowel as well as colon plans for enteroscopy tomorrow. 4. CKD-MBD: Calcium level acceptable, monitor on phosphorus binder and Hectorol for PTH suppression. 5. Nutrition: Continue renal diet with ONS and fluid restriction. 6. Hypertension: Elevated blood pressure noted, reeducated on limiting interdialytic weight gain as well as adherence with antihypertensive therapy.  Subjective:   Complains of back pain with difficulty positioning herself in recliner this morning.   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  Labs: BMET Recent Labs  Lab 07/16/21 1059 07/17/21 0500  07/18/21 0257 07/19/21 0329 07/20/21 0201 07/20/21 2329  NA 139 133* 131* 133* 132* 133*  K 4.0 3.3* 4.7 4.4 3.7 4.2  CL 100 94* 96* 95* 97* 95*  CO2 28 29 26 27 30 27   GLUCOSE 75 80 97 114* 110* 110*  BUN 22 11 33* 26* 20 35*  CREATININE 4.49* 2.45* 4.17* 3.49* 3.15* 4.99*  CALCIUM 8.1* 8.4* 8.3* 8.7* 8.7* 8.9   CBC Recent Labs  Lab 07/18/21 0257 07/19/21 0329 07/19/21 1757 07/20/21 0201 07/20/21 2329  WBC 4.8 3.9*  --  3.7* 4.3  HGB 7.5* 7.1* 8.0* 7.6* 7.4*  HCT 23.9* 22.6* 25.8* 23.9* 23.6*  MCV 100.0 100.0  --  98.0 98.7  PLT 164 155  --  157 166     Medications:     sodium chloride   Intravenous Once   sodium chloride   Intravenous Once   allopurinol  100 mg Oral QPM   amLODipine  10 mg Oral Daily   Chlorhexidine Gluconate Cloth  6 each Topical Q0600   Chlorhexidine Gluconate Cloth  6 each Topical Daily   cloNIDine  0.1 mg Oral TID   hydrALAZINE  100 mg Oral TID   hydrocortisone   Rectal BID   irbesartan  300 mg Oral Daily   isosorbide mononitrate  60 mg Oral Daily   labetalol  400 mg Oral TID   montelukast  10 mg Oral Q supper   pantoprazole  40 mg Oral Daily   predniSONE  5 mg Oral Q breakfast   umeclidinium bromide  1 puff Inhalation Daily   Elmarie Shiley, MD 07/21/2021, 8:39 AM

## 2021-07-21 NOTE — Progress Notes (Signed)
Tech notified this RN that Pt wants to obtain weight later this AM. Attempted but pt said" I want to sleep"

## 2021-07-22 ENCOUNTER — Inpatient Hospital Stay (HOSPITAL_COMMUNITY): Payer: Medicare PPO | Admitting: Anesthesiology

## 2021-07-22 ENCOUNTER — Encounter (HOSPITAL_COMMUNITY): Payer: Self-pay | Admitting: Student in an Organized Health Care Education/Training Program

## 2021-07-22 ENCOUNTER — Encounter (HOSPITAL_COMMUNITY)
Admission: EM | Disposition: A | Discharge status: Home / Self Care | Payer: Self-pay | Source: Home / Self Care | Attending: Internal Medicine

## 2021-07-22 DIAGNOSIS — I2510 Atherosclerotic heart disease of native coronary artery without angina pectoris: Secondary | ICD-10-CM | POA: Diagnosis not present

## 2021-07-22 DIAGNOSIS — T182XXA Foreign body in stomach, initial encounter: Secondary | ICD-10-CM

## 2021-07-22 DIAGNOSIS — I110 Hypertensive heart disease with heart failure: Secondary | ICD-10-CM

## 2021-07-22 DIAGNOSIS — I509 Heart failure, unspecified: Secondary | ICD-10-CM | POA: Diagnosis not present

## 2021-07-22 DIAGNOSIS — K921 Melena: Secondary | ICD-10-CM | POA: Diagnosis not present

## 2021-07-22 HISTORY — PX: ENTEROSCOPY: SHX5533

## 2021-07-22 LAB — GLUCOSE, CAPILLARY
Glucose-Capillary: 89 mg/dL (ref 70–99)
Glucose-Capillary: 90 mg/dL (ref 70–99)
Glucose-Capillary: 91 mg/dL (ref 70–99)

## 2021-07-22 LAB — CBC
HCT: 26.4 % — ABNORMAL LOW (ref 36.0–46.0)
Hemoglobin: 8.2 g/dL — ABNORMAL LOW (ref 12.0–15.0)
MCH: 31.1 pg (ref 26.0–34.0)
MCHC: 31.1 g/dL (ref 30.0–36.0)
MCV: 100 fL (ref 80.0–100.0)
Platelets: 181 10*3/uL (ref 150–400)
RBC: 2.64 MIL/uL — ABNORMAL LOW (ref 3.87–5.11)
RDW: 18.8 % — ABNORMAL HIGH (ref 11.5–15.5)
WBC: 5 10*3/uL (ref 4.0–10.5)
nRBC: 0 % (ref 0.0–0.2)

## 2021-07-22 SURGERY — ENTEROSCOPY
Anesthesia: Monitor Anesthesia Care

## 2021-07-22 MED ORDER — PANTOPRAZOLE SODIUM 40 MG PO TBEC
40.0000 mg | DELAYED_RELEASE_TABLET | Freq: Once | ORAL | Status: AC
  Administered 2021-07-22: 40 mg via ORAL
  Filled 2021-07-22: qty 1

## 2021-07-22 MED ORDER — OXYCODONE HCL 5 MG PO TABS
10.0000 mg | ORAL_TABLET | Freq: Three times a day (TID) | ORAL | Status: DC | PRN
  Administered 2021-07-23: 10 mg via ORAL
  Filled 2021-07-22: qty 2

## 2021-07-22 MED ORDER — SORBITOL 70 % SOLN
30.0000 mL | Freq: Every day | Status: DC | PRN
  Administered 2021-07-22: 30 mL via ORAL
  Filled 2021-07-22: qty 30

## 2021-07-22 MED ORDER — LIDOCAINE 2% (20 MG/ML) 5 ML SYRINGE
INTRAMUSCULAR | Status: DC | PRN
  Administered 2021-07-22: 40 mg via INTRAVENOUS

## 2021-07-22 MED ORDER — LIDOCAINE 5 % EX PTCH
1.0000 | MEDICATED_PATCH | Freq: Two times a day (BID) | CUTANEOUS | Status: DC | PRN
  Administered 2021-07-22: 1 via TRANSDERMAL
  Filled 2021-07-22: qty 1

## 2021-07-22 MED ORDER — PROPOFOL 500 MG/50ML IV EMUL
INTRAVENOUS | Status: DC | PRN
  Administered 2021-07-22: 100 ug/kg/min via INTRAVENOUS

## 2021-07-22 MED ORDER — SODIUM CHLORIDE 0.9 % IV SOLN: INTRAVENOUS | Status: DC

## 2021-07-22 MED ORDER — ALUM & MAG HYDROXIDE-SIMETH 200-200-20 MG/5ML PO SUSP
30.0000 mL | ORAL | Status: DC | PRN
  Administered 2021-07-22: 30 mL via ORAL
  Filled 2021-07-22: qty 30

## 2021-07-22 MED ORDER — SODIUM CHLORIDE 0.9 % IV SOLN: INTRAVENOUS | Status: DC | PRN

## 2021-07-22 MED ORDER — METHOCARBAMOL 500 MG PO TABS
500.0000 mg | ORAL_TABLET | Freq: Three times a day (TID) | ORAL | Status: DC | PRN
  Administered 2021-07-22: 500 mg via ORAL
  Filled 2021-07-22: qty 1

## 2021-07-22 MED ORDER — PROPOFOL 10 MG/ML IV BOLUS
INTRAVENOUS | Status: DC | PRN
  Administered 2021-07-22 (×2): 20 mg via INTRAVENOUS

## 2021-07-22 NOTE — Progress Notes (Signed)
Physical Therapy Treatment Patient Details Name: Jodi Bell MRN: [id number] DOB: [birth year] Today's Date: 07/22/2021   History of Present Illness Patient is a 61 yo female presenting 6/6 with chest pain. Suspect chest pain due to HTN emergency, pulmonary edema, and volume overload. Pt with episode of bright red blood in stool on 6/7, planned capsule endoscopy for 6/9. PMH includes: DM II, CAD s/p CABG in 2022, anxiety, arthritis, COPD, ESRD on HD, GERD, gout, obesity, PAD, and HTN.    PT Comments    Pt received OOB in chair on arrival and agreeable to session with encouragement, 2/2 c/o chronic back pain. Pt able to come to standing without physical assist and no AD with supervision for safety. Ambulation distance and session limited 2/2 transport arriving for pt procedure. Pt able to ambulate from room into hall and to transport WC without AD with min guard assist for O2 line with some instability noted in gait but no overt LOB. Pt continues to benefit from skilled PT services to progress toward functional mobility goals.    Recommendations for follow up therapy are one component of a multi-disciplinary discharge planning process, led by the attending physician.  Recommendations may be updated based on patient status, additional functional criteria and insurance authorization.  Follow Up Recommendations  Home health PT     Assistance Recommended at Discharge Intermittent Supervision/Assistance  Patient can return home with the following A little help with walking and/or transfers;A little help with bathing/dressing/bathroom;Assistance with cooking/housework;Assist for transportation;Help with stairs or ramp for entrance   Equipment Recommendations  None recommended by PT    Recommendations for Other Services       Precautions / Restrictions Precautions Precautions: Fall Restrictions Other Position/Activity Restrictions: on O2 at baseline     Mobility  Bed Mobility Overal bed  mobility: Modified Independent             General bed mobility comments: pt OOB in recliner on arrival    Transfers Overall transfer level: Modified independent Equipment used: None   Sit to Stand: Modified independent (Device/Increase time)           General transfer comment: steady rise, pt somewhat impulsive coming to stand quickly    Ambulation/Gait Ambulation/Gait assistance: Min guard, Supervision Gait Distance (Feet): 30 Feet Assistive device: None Gait Pattern/deviations: Step-through pattern, Wide base of support Gait velocity: reduced     General Gait Details: pt is able to turn and maneuver with assistance for only the O2 line, distance limited to trasnport in hall with WC to pick up pt for procedure.   Stairs             Wheelchair Mobility    Modified Rankin (Stroke Patients Only)       Balance Overall balance assessment: Needs assistance   Sitting balance-Leahy Scale: Good     Standing balance support: No upper extremity supported Standing balance-Leahy Scale: Good                              Cognition Arousal/Alertness: Awake/alert Behavior During Therapy: WFL for tasks assessed/performed Overall Cognitive Status: Within Functional Limits for tasks assessed                                          Exercises      General Comments General comments (skin  integrity, edema, etc.): VSS on 2L      Pertinent Vitals/Pain Pain Assessment Pain Assessment: Faces Faces Pain Scale: Hurts little more Pain Location: low back Pain Descriptors / Indicators: Discomfort, Sharp, Shooting Pain Intervention(s): Monitored during session    Home Living                          Prior Function            PT Goals (current goals can now be found in the care plan section) Acute Rehab PT Goals PT Goal Formulation: With patient Time For Goal Achievement: 07/24/21    Frequency    Min  3X/week      PT Plan Current plan remains appropriate    Co-evaluation              AM-PAC PT "6 Clicks" Mobility   Outcome Measure  Help needed turning from your back to your side while in a flat bed without using bedrails?: None Help needed moving from lying on your back to sitting on the side of a flat bed without using bedrails?: A Little Help needed moving to and from a bed to a chair (including a wheelchair)?: A Little Help needed standing up from a chair using your arms (e.g., wheelchair or bedside chair)?: A Little Help needed to walk in hospital room?: A Little Help needed climbing 3-5 steps with a railing? : A Little 6 Click Score: 19    End of Session Equipment Utilized During Treatment: Gait belt;Oxygen Activity Tolerance: Patient tolerated treatment well;Patient limited by fatigue Patient left: in chair;with nursing/sitter in room (in transport chair) Nurse Communication: Mobility status PT Visit Diagnosis: Unsteadiness on feet (R26.81);Muscle weakness (generalized) (M62.81)     Time: 2751-7001 PT Time Calculation (min) (ACUTE ONLY): 11 min  Charges:  $Gait Training: 8-22 mins                     Annelies Coyt R. PTA Acute Rehabilitation Services Office: [place] 07/22/2021, 1:33 PM

## 2021-07-22 NOTE — Procedures (Signed)
Small bowel push enteroscopy performed with ultrathin colonoscope.  Findings: Whitish plaques noted in esophagus, likely from recently ingested medications. Z-line regular at 35 cm. Endo Clip noted in gastric fundus without associated bleeding, rest of the stomach appeared unremarkable. Duodenal bulb and rest of the duodenum appeared unremarkable. Proximal jejunum appeared unremarkable up to 160 cm from insertion.   Recommendations: Resume renal diet. Monitor H&H and transfuse if needed. No active bleeding noted. Last bowel movement was on Saturday. GI will sign off, please recall if needed.

## 2021-07-22 NOTE — Anesthesia Procedure Notes (Signed)
Procedure Name: MAC Date/Time: 07/22/2021 2:10 PM  Performed by: Jenne Campus, CRNAPre-anesthesia Checklist: Patient identified, Emergency Drugs available, Suction available and Patient being monitored Oxygen Delivery Method: Nasal cannula

## 2021-07-22 NOTE — Progress Notes (Signed)
[facility] Progress Note   Subjective:   NPO for enteroscopy later today, reports she is hungry. Reports mild SOB and chronic back pain. No CP, palpitations, or dizziness.   Objective Vitals:   07/22/21 0438 07/22/21 0439 07/22/21 0500 07/22/21 0944  BP: (!) 159/68   (!) 162/68  Pulse:    70  Resp: 13   15  Temp: 98 F (36.7 C)   98.5 F (36.9 C)  TempSrc: Oral   Oral  SpO2: 99% 99%    Weight:   63.3 kg   Height:       Physical Exam General: WDWN female, alert and in NAD Heart: RRR, no murmurs, rubs or gallops Lungs: CTA bilaterally without wheezing, rhonchi or rales. On 2L O2 via [place] Abdomen: Soft, non-distended, +BS Extremities: No edema bilateral lower extremities Dialysis Access:  lue AVF, aneurysmal, +Bruit  Additional Objective Labs: Basic Metabolic Panel: Recent Labs  Lab 07/19/21 0329 07/20/21 0201 07/20/21 2329  NA 133* 132* 133*  K 4.4 3.7 4.2  CL 95* 97* 95*  CO2 27 30 27   GLUCOSE 114* 110* 110*  BUN 26* 20 35*  CREATININE 3.49* 3.15* 4.99*  CALCIUM 8.7* 8.7* 8.9   Liver Function Tests: No results for input(s): "AST", "ALT", "ALKPHOS", "BILITOT", "PROT", "ALBUMIN" in the last 168 hours. No results for input(s): "LIPASE", "AMYLASE" in the last 168 hours. CBC: Recent Labs  Lab 07/18/21 0257 07/19/21 0329 07/19/21 1757 07/20/21 0201 07/20/21 2329 07/21/21 0926 07/22/21 0207  WBC 4.8 3.9*  --  3.7* 4.3  --  5.0  HGB 7.5* 7.1*   < > 7.6* 7.4* 7.8* 8.2*  HCT 23.9* 22.6*   < > 23.9* 23.6* 24.5* 26.4*  MCV 100.0 100.0  --  98.0 98.7  --  100.0  PLT 164 155  --  157 166  --  181   < > = values in this interval not displayed.   Blood Culture    Component Value Date/Time   SDES BLOOD SITE NOT SPECIFIED 06/26/2021 0650   SPECREQUEST  06/26/2021 0650    BOTTLES DRAWN AEROBIC AND ANAEROBIC Blood Culture results may not be optimal due to an excessive volume of blood received in culture bottles   CULT  06/26/2021 0650    NO GROWTH 5  DAYS Performed at [facility], [place] [address] [place], [place] [address]    REPTSTATUS 07/01/2021 FINAL 06/26/2021 0650    Cardiac Enzymes: No results for input(s): "CKTOTAL", "CKMB", "CKMBINDEX", "TROPONINI" in the last 168 hours. CBG: Recent Labs  Lab 07/16/21 1123 07/22/21 0738  GLUCAP 73 89   Iron Studies: No results for input(s): "IRON", "TIBC", "TRANSFERRIN", "FERRITIN" in the last 72 hours. @lablastinr3 @ Studies/Results: No results found. Medications:  sodium chloride 20 mL/hr at 07/22/21 0649   nitroGLYCERIN Stopped (07/16/21 1736)    allopurinol  100 mg Oral QPM   amLODipine  10 mg Oral Daily   cetirizine  10 mg Oral Daily   Chlorhexidine Gluconate Cloth  6 each Topical Q0600   Chlorhexidine Gluconate Cloth  6 each Topical Daily   cloNIDine  0.1 mg Oral TID   hydrALAZINE  100 mg Oral TID   hydrocortisone   Rectal BID   irbesartan  300 mg Oral Daily   isosorbide mononitrate  60 mg Oral Daily   labetalol  400 mg Oral TID   montelukast  10 mg Oral Q supper   pantoprazole  40 mg Oral Daily   predniSONE  5  mg Oral Q breakfast   umeclidinium bromide  1 puff Inhalation Daily    Assessment/Plan: 1.  Hypoxic respiratory failure: Secondary to volume overload in combination with acute exacerbation of COPD.  Respiratory status has improved and she is back to baseline with ongoing oxygen supplementation via nasal cannula. Continue UF with HD as tolerated 2. ESRD: She is routinely on a Monday/Wednesday/Friday hemodialysis schedule and is planned for HD today after enteroscopy 3. Anemia: Hemoglobin 8.2, mildly improved.  Report of capsule endoscopy noted with mild gastritis/old blood in mid small bowel as well as colon plans for enteroscopy today. 4. CKD-MBD: Calcium level acceptable, monitor on phosphorus binder and Hectorol for PTH suppression. 5. Nutrition: Continue renal diet with ONS and fluid restriction. 6. Hypertension: Elevated blood pressure noted, reeducated  on limiting interdialytic weight gain as well as adherence with antihypertensive therapy.  Anice Paganini, PA-C 07/22/2021, 10:10 AM  [facility] Pager: [phone]

## 2021-07-22 NOTE — Progress Notes (Addendum)
HD#1 Subjective:  Overnight Events: NAEON.  Patient seen and assessed at bedside.  Patient eager to have enteroscopy and HD so that she can eat afterwards.  Patient continues to report chronic back pain but does report that lidocaine patch did help with pain. Discussed giving robaxin as well which patient was amenable to.  Objective:  Vital signs in last 24 hours: Vitals:   07/22/21 0120 07/22/21 0124 07/22/21 0438 07/22/21 0439  BP:  (!) 178/69 (!) 159/68   Pulse:      Resp: 18 18 13    Temp:  98.2 F (36.8 C) 98 F (36.7 C)   TempSrc:  Oral Oral   SpO2:  96% 99% 99%  Weight:      Height:       Supplemental O2: Nasal Cannula SpO2: 99 % O2 Flow Rate (L/min): 2 L/min   Physical Exam:  Physical Exam Constitutional:      General: She is not in acute distress.    Appearance: She is normal weight. She is not ill-appearing.  HENT:     Head: Normocephalic and atraumatic.  Cardiovascular:     Rate and Rhythm: Normal rate and regular rhythm.     Heart sounds: Murmur heard.     Comments: Diastolic murmur Pulmonary:     Effort: Pulmonary effort is normal. No tachypnea or respiratory distress.  Skin:    General: Skin is warm and dry.     Capillary Refill: Capillary refill takes less than 2 seconds.  Neurological:     General: No focal deficit present.     Mental Status: She is alert and oriented to person, place, and time.  Psychiatric:        Mood and Affect: Mood normal.        Behavior: Behavior normal.     Filed Weights   07/19/21 1334 07/19/21 1635 07/20/21 0450  Weight: 66.7 kg 66.1 kg 63.9 kg     Intake/Output Summary (Last 24 hours) at 07/22/2021 0645 Last data filed at 07/21/2021 2033 Gross per 24 hour  Intake 120 ml  Output --  Net 120 ml    Net IO Since Admission: 1,461.22 mL [07/22/21 0645]  Pertinent Labs:    Latest Ref Rng & Units 07/22/2021    2:07 AM 07/21/2021    9:26 AM 07/20/2021   11:29 PM  CBC  WBC 4.0 - 10.5 K/uL 5.0   4.3    Hemoglobin 12.0 - 15.0 g/dL 8.2  7.8  7.4   Hematocrit 36.0 - 46.0 % 26.4  24.5  23.6   Platelets 150 - 400 K/uL 181   166        Latest Ref Rng & Units 07/20/2021   11:29 PM 07/20/2021    2:01 AM 07/19/2021    3:29 AM  CMP  Glucose 70 - 99 mg/dL 110  110  114   BUN 8 - 23 mg/dL 35  20  26   Creatinine 0.44 - 1.00 mg/dL 4.99  3.15  3.49   Sodium 135 - 145 mmol/L 133  132  133   Potassium 3.5 - 5.1 mmol/L 4.2  3.7  4.4   Chloride 98 - 111 mmol/L 95  97  95   CO2 22 - 32 mmol/L 27  30  27    Calcium 8.9 - 10.3 mg/dL 8.9  8.7  8.7     Imaging: No results found.  Assessment/Plan:    Patient Summary: Jodi Bell is a 61 y.o. female  with past medical history of hypertension, type 2 diabetes mellitus, COPD on 3 L supplemental oxygen at baseline, ESRD on HD MWF initally presenting with chest pain (now resolved) and continues to be hospitalized for GI Bleed  Subacute GI Bleeding Acute on Chronic macrocytic Anemia Hgb 7.8>8.2.   -GI consulted, greatly appreciate recs -Enteroscope by Dr. Therisa Bell today, appreciate assistance with patient's care -Transfusion threshold <7  Chronic Severe Asymptomatic Hypertension SBP stable in 140-160s. Will continue to monitor. -HD per nephro recs -Labetalol 400 mg TID -Amlodipine 10 mg QD -Hydralazine 100 mg TID -Clonidine 0.1 mg TID -Irbesartan 300 mg QD  Non cardiac chest pain, resolved CAD History of NSTEMI with CABG x 2 in August 2022 -Cardiology outpatient follow-up for stress test -antihypertensives as above   Pulmonary Vascular Congestion End Stage Renal Disease on MWF Hemodialysis ESRD MWF. -Nephrology following, appreciate recommendations. -HD per nephro recs   Chronic Hypoxemic Respiratory Failure COPD OSA (CPAP noncompliant) Patient with longstanding COPD on 2-3 L O2 at home -PRN albuterol -Home montelukast -Home tiotropium -PT/OT eval and treat   History of Type 2 Diabetes Mellitus Most recent A1C of 5.2 in August  2022. Not on diabetes medications at home.   GERD History of bleeding esophageal ulcer History of Esophagitis -Pantoprazole 40 mg qd   Gout -Continue home Allopurinol 100 mg.    Psoriatic Arthritis -Continue home Prednisone 5 mg   Insomnia -Continue home ambien qhs prn  Chronic Lumbar Pain -Continue home oxy 10 q12 hr prn -Lidocaine Patch -Robaxin 500 q8h prn  Seasonal Allergies -Cetirizine 5 mg qd   Diet: Low sodium diet VTE: SCD IVF: None Code: Full   Prior to Admission Living Arrangement: Home Anticipated Discharge Location: TBD Barriers to Discharge: Medical Stability Dispo: Anticipated discharge in approximately 1-2 day(s).   [name], MD 07/22/2021, 6:45 AM Pager: [phone]  Please contact the on call pager after 5 pm and on weekends at [phone].

## 2021-07-22 NOTE — Op Note (Signed)
[facility] Patient Name: Jodi Bell Procedure Date : 07/22/2021 MRN: [id number] Attending MD: Ronnette Juniper , MD Date of Birth: 1960/12/24 CSN: [id number] Age: 61 Admit Type: Inpatient Procedure:                Small bowel enteroscopy Indications:              Abnormal video capsule endoscopy Providers:                Ronnette Juniper, MD, Gloris Ham, Technician,                            Dulcy Fanny Referring MD:             [facility] Resident team Medicines:                Monitored Anesthesia Care Complications:            No immediate complications. Estimated blood loss:                            None. Estimated Blood Loss:     Estimated blood loss: none. Procedure:                Pre-Anesthesia Assessment:                           - Prior to the procedure, a History and Physical                            was performed, and patient medications and                            allergies were reviewed. The patient's tolerance of                            previous anesthesia was also reviewed. The risks                            and benefits of the procedure and the sedation                            options and risks were discussed with the patient.                            All questions were answered, and informed consent                            was obtained. Prior Anticoagulants: The patient has                            taken no previous anticoagulant or antiplatelet                            agents. ASA Grade Assessment: III - A patient with  severe systemic disease. After reviewing the risks                            and benefits, the patient was deemed in                            satisfactory condition to undergo the procedure.                           After obtaining informed consent, the endoscope was                            passed under direct vision. Throughout the                             procedure, the patient's blood pressure, pulse, and                            oxygen saturations were monitored continuously. The                            PCF-H190TL ([id number] Olympus slim colonoscope was                            introduced through the mouth and advanced to the                            jejunum, to the 160 cm mark (from the incisors).                            The small bowel enteroscopy was accomplished                            without difficulty. The patient tolerated the                            procedure well. Scope In: Scope Out: Findings:      Whitish plaques noted in the entire esophagus, suspicious for recent       medication use.      The Z-line was regular and was found 35 cm from the incisors.      An endoclip was found in the gastric fundus.      There was no evidence of significant pathology in the entire examined       duodenum.      There was no evidence of significant pathology in the proximal jejunum       examined up to 160 cm from insertion. Impression:               - Z-line regular, 35 cm from the incisors.                           - An endoclip was found in the stomach.                           -  Normal examined duodenum.                           - The examined portion of the jejunum was normal.                           - No specimens collected. Moderate Sedation:      Patient did not receive moderate sedation for this procedure, but       instead received monitored anesthesia care. Recommendation:           - Renal diet.                           - Monitor H and H and transfuse as needed. Procedure Code(s):        --- Professional ---                           [phone], Small intestinal endoscopy, enteroscopy                            beyond second portion of duodenum, not including                            ileum; diagnostic, including collection of                            specimen(s) by brushing or washing, when  performed                            (separate procedure) Diagnosis Code(s):        --- Professional ---                           V49.4WHQ, Foreign body in stomach, initial encounter                           R93.3, Abnormal findings on diagnostic imaging of                            other parts of digestive tract CPT copyright 2019 [employer]. All rights reserved. The codes documented in this report are preliminary and upon coder review may  be revised to meet current compliance requirements. Ronnette Juniper, MD 07/22/2021 2:33:54 PM This report has been signed electronically. Number of Addenda: 0

## 2021-07-22 NOTE — Transfer of Care (Signed)
Immediate Anesthesia Transfer of Care Note  Patient: Jodi Bell  Procedure(s) Performed: ENTEROSCOPY  Patient Location: Endoscopy Unit  Anesthesia Type:MAC  Level of Consciousness: oriented, sedated and patient cooperative  Airway & Oxygen Therapy: Patient Spontanous Breathing and Patient connected to nasal cannula oxygen  Post-op Assessment: Report given to RN and Post -op Vital signs reviewed and stable  Post vital signs: Reviewed  Last Vitals:  Vitals Value Taken Time  BP 131/56 07/22/21 1437  Temp    Pulse 62 07/22/21 1438  Resp 21 07/22/21 1438  SpO2 95 % 07/22/21 1438  Vitals shown include unvalidated device data.  Last Pain:  Vitals:   07/22/21 1328  TempSrc: Temporal  PainSc: 0-No pain      Patients Stated Pain Goal: 0 (26/83/41 9622)  Complications: No notable events documented.

## 2021-07-22 NOTE — Progress Notes (Signed)
Pt is now requesting sorbitol for constipation (BM 2 days ago), says she knows it's contraindicated with her renal disease, but wants it anyway because she takes it at home. Provider notified.

## 2021-07-22 NOTE — Progress Notes (Signed)
Occupational Therapy Treatment and Discharge Patient Details Name: Jodi Bell MRN: [id number] DOB: 31-May-1960 Today's Date: 07/22/2021   History of present illness Patient is a 61 yo female presenting 6/6 with chest pain. Suspect chest pain due to HTN emergency, pulmonary edema, and volume overload. Pt with episode of bright red blood in stool on 6/7, planned capsule endoscopy for 6/9. PMH includes: DM II, CAD s/p CABG in 2022, anxiety, arthritis, COPD, ESRD on HD, GERD, gout, obesity, PAD, and HTN.   OT comments  Pt with c/o chronic back pain. Moving without assist or unsteadiness out of bed and around room. No physical assist for ADLs performed this visit. Pt back on her baseline 02 level. Pt remained up in chair and more comfortable than in bed at end of session. No further OT needs. Pt is eager to go home after results of today's procedure.    Recommendations for follow up therapy are one component of a multi-disciplinary discharge planning process, led by the attending physician.  Recommendations may be updated based on patient status, additional functional criteria and insurance authorization.    Follow Up Recommendations  No OT follow up    Assistance Recommended at Discharge PRN  Patient can return home with the following      Equipment Recommendations  None recommended by OT    Recommendations for Other Services      Precautions / Restrictions Precautions Precautions: Fall Restrictions Weight Bearing Restrictions: No Other Position/Activity Restrictions: on O2 at baseline       Mobility Bed Mobility Overal bed mobility: Modified Independent             General bed mobility comments: HOB up slightly    Transfers Overall transfer level: Modified independent Equipment used: None   Sit to Stand: Modified independent (Device/Increase time)                 Balance Overall balance assessment: Needs assistance   Sitting balance-Leahy Scale: Good      Standing balance support: No upper extremity supported Standing balance-Leahy Scale: Good                             ADL either performed or assessed with clinical judgement   ADL Overall ADL's : Needs assistance/impaired     Grooming: Modified independent;Standing   Upper Body Bathing: Set up;Sitting           Lower Body Dressing: Set up;Sitting/lateral leans   Toilet Transfer: Modified Independent Toilet Transfer Details (indicate cue type and reason): without AD Toileting- Clothing Manipulation and Hygiene: Modified independent         General ADL Comments: educated in energy conservation, pt able to verbalize back    Extremity/Trunk Assessment              Vision       Perception     Praxis      Cognition Arousal/Alertness: Awake/alert Behavior During Therapy: WFL for tasks assessed/performed Overall Cognitive Status: Within Functional Limits for tasks assessed                                          Exercises      Shoulder Instructions       General Comments      Pertinent Vitals/ Pain       Pain Assessment  Pain Assessment: Faces Faces Pain Scale: Hurts even more Pain Location: low back Pain Descriptors / Indicators: Discomfort, Sharp, Shooting Pain Intervention(s): Premedicated before session, Repositioned  Home Living                                          Prior Functioning/Environment              Frequency           Progress Toward Goals  OT Goals(current goals can now be found in the care plan section)  Progress towards OT goals: Goals met/education completed, patient discharged from [name] All goals met and education completed, patient discharged from OT services    Co-evaluation                 AM-PAC OT "6 Clicks" Daily Activity     Outcome Measure   Help from another person eating meals?: None Help from another person taking care of personal  grooming?: None Help from another person toileting, which includes using toliet, bedpan, or urinal?: None Help from another person bathing (including washing, rinsing, drying)?: None Help from another person to put on and taking off regular upper body clothing?: None Help from another person to put on and taking off regular lower body clothing?: None 6 Click Score: 24    End of Session Equipment Utilized During Treatment: Oxygen  OT Visit Diagnosis: Muscle weakness (generalized) (M62.81)   Activity Tolerance Patient tolerated treatment well   Patient Left in chair;with call bell/phone within reach   Nurse Communication          Time: 3546-5681 OT Time Calculation (min): 23 min  Charges: OT General Charges $OT Visit: 1 Visit OT Treatments $Self Care/Home Management : 23-37 mins  Jodi Bell, OTR/L Acute Rehabilitation Services Office: [phone]  Jodi Bell 07/22/2021, 11:23 AM

## 2021-07-22 NOTE — Anesthesia Preprocedure Evaluation (Addendum)
Anesthesia Evaluation  Patient identified by MRN, date of birth, ID band Patient awake    Reviewed: Allergy & Precautions, NPO status , Patient's Chart, lab work & pertinent test results  History of Anesthesia Complications Negative for: history of anesthetic complications  Airway Mallampati: I  TM Distance: >3 FB Neck ROM: Full    Dental  (+) Partial Lower, Edentulous Upper, Dental Advisory Given   Pulmonary shortness of breath, asthma , sleep apnea , pneumonia, COPD, Current Smoker, former smoker,    Pulmonary exam normal        Cardiovascular hypertension, + CAD, + Past MI, + Peripheral Vascular Disease, +CHF and + DVT  Normal cardiovascular exam  IMPRESSIONS   1. Left ventricular ejection fraction, by estimation, is 65 to 70%. The left ventricle has normal function. The left ventricle has no regional wall motion abnormalities. There is severe concentric left ventricular hypertrophy. Left ventricular diastolic parameters are consistent with Grade II diastolic dysfunction (pseudonormalization). Elevated left atrial pressure. 2. Right ventricular systolic function is mildly reduced. The right ventricular size is normal. There is moderately elevated pulmonary artery systolic pressure. The estimated right ventricular systolic pressure is 12.4 mmHg which is higher than previous TTE on 06/26/21 at 735mHg. May be related to volume overload, however, PE is on the differential. Recommend clinical correlation. 3. Left atrial size was severely dilated. 4. Right atrial size was mildly dilated. 5. The mitral valve is degenerative. Mild mitral valve regurgitation. Severe mitral annular calcification. 6. Tricuspid valve regurgitation is mild to moderate. 7. The aortic valve is tricuspid. There is moderate calcification of the aortic valve. There is moderate thickening of the aortic valve. Aortic valve regurgitation is not visualized.  Moderate aortic valve stenosis. Aortic valve mean gradient measures  36.0 mmHg. Aortic valve Vmax measures 4.11 m/s. AVA by continuity 1.4cm2, DI 0.565m. Suspect elevated mean gradient and Vmax in the setting of high output with LVOT VTI 40.4cm. 8. Aortic dilatation noted. There is borderline dilatation of the ascending aorta, measuring 39 mm. 9. The inferior vena cava is dilated in size with <50% respiratory variability, suggesting right atrial pressure of 15 mmHg.  Comparison(s): Compared to prior TTE on 06/26/21, the PASP is now 55.35m59m from 34.6mm44mand the RV is mildly hypokinetic. Elevated PASP may be related to volume overload, however, PE is on the differential. Otherwise, there continues to be moderate AS  with similar gradients.   Neuro/Psych  Headaches,    GI/Hepatic Neg liver ROS, hiatal hernia, GERD  ,  Endo/Other  diabetes  Renal/GU Renal disease     Musculoskeletal  (+) Arthritis ,   Abdominal   Peds  Hematology   Anesthesia Other Findings   Reproductive/Obstetrics                            Anesthesia Physical  Anesthesia Plan  ASA: 3  Anesthesia Plan: MAC   Post-op Pain Management:    Induction: Intravenous  PONV Risk Score and Plan: 2 and Propofol infusion  Airway Management Planned: Nasal Cannula and Simple Face Mask  Additional Equipment:   Intra-op Plan:   Post-operative Plan:   Informed Consent: I have reviewed the patients History and Physical, chart, labs and discussed the procedure including the risks, benefits and alternatives for the proposed anesthesia with the patient or authorized representative who has indicated his/her understanding and acceptance.     Dental advisory given  Plan Discussed with: Anesthesiologist and CRNA  Anesthesia Plan  Comments:        Anesthesia Quick Evaluation

## 2021-07-22 NOTE — Plan of Care (Signed)

## 2021-07-22 NOTE — Progress Notes (Signed)
Patient requesting medication frequency change D/T severe back pain. Provider notified.

## 2021-07-22 NOTE — Interval H&P Note (Signed)
History and Physical Interval Note: 61/female for push enteroscopy for abnormal capsule endoscopy.  07/22/2021 2:04 PM  [name]  has presented today for push enteroscopy, with the diagnosis of Small bowel bleeding.  The various methods of treatment have been discussed with the patient and family. After consideration of risks, benefits and other options for treatment, the patient has consented to  Procedure(s): ENTEROSCOPY (N/A) as a surgical intervention.  The patient's history has been reviewed, patient examined, no change in status, stable for surgery.  I have reviewed the patient's chart and labs.  Questions were answered to the patient's satisfaction.     Ronnette Juniper

## 2021-07-22 NOTE — Progress Notes (Signed)
Patient doesn't want to go to hemodialysis tonight and states she wants to go in the morning. Called HD and they already had her scheduled for in the morning and will call back around 0530 for report. Thanks

## 2021-07-22 NOTE — Anesthesia Postprocedure Evaluation (Signed)
Anesthesia Post Note  Patient: Jodi Bell  Procedure(s) Performed: ENTEROSCOPY     Patient location during evaluation: Endoscopy Anesthesia Type: MAC Level of consciousness: awake and alert Pain management: pain level controlled Vital Signs Assessment: post-procedure vital signs reviewed and stable Respiratory status: spontaneous breathing, nonlabored ventilation, respiratory function stable and patient connected to nasal cannula oxygen Cardiovascular status: blood pressure returned to baseline and stable Postop Assessment: no apparent nausea or vomiting Anesthetic complications: no   No notable events documented.  Last Vitals:  Vitals:   07/22/21 1447 07/22/21 1457  BP: (!) 138/57 (!) 155/54  Pulse: 65 65  Resp: 16 16  Temp:    SpO2: 95% 94%    Last Pain:  Vitals:   07/22/21 1457  TempSrc:   PainSc: 0-No pain                 [name]

## 2021-07-22 NOTE — Progress Notes (Signed)
Received a message from the nurse about medication requests from the patient as well as concern from the family members over patient not receiving dialysis today. Per chart review patient went for endoscopy today and dialysis was ready to take the patient when she was gone for the procedure so they came back around 8 pm tonight to take the patient to dialysis instead. Patient declined dialysis due to not wanting to sit in the chair for so long this late at night and requested she be taken in the morning. Patient already on the schedule for early tomorrow morning per her nurse. Conveyed this message to family that patient declined PM dialysis but would be going for it tomorrow morning.  For her medication requests she wanted her lidocaine patch changed to Q12. She was additionally requesting something be given for her heart burn. She is already on protonix daily, however for her breakthrough symptoms will give a one time dose of an additional 40 mg orally again tonight. It is possible her throat is sore from the endoscopy today. For her constipation she takes sorbitol at home. This was initially held in favor of senna due to contraindication for anuria but upon clarification this appears to only be contraindicated for bladder irrigation purposes. Order placed for sorbitol for constipation. On PDMP review it appears that patient is given oxycodone 10 mg TID, however prior DC summary said oxycodone 10 once daily PRN. Will increase prescription to oxycodone Q8 PRN.

## 2021-07-23 ENCOUNTER — Other Ambulatory Visit (HOSPITAL_COMMUNITY): Payer: Self-pay

## 2021-07-23 DIAGNOSIS — Z992 Dependence on renal dialysis: Secondary | ICD-10-CM

## 2021-07-23 DIAGNOSIS — N186 End stage renal disease: Secondary | ICD-10-CM | POA: Diagnosis not present

## 2021-07-23 DIAGNOSIS — K921 Melena: Secondary | ICD-10-CM | POA: Diagnosis not present

## 2021-07-23 DIAGNOSIS — R079 Chest pain, unspecified: Secondary | ICD-10-CM | POA: Diagnosis not present

## 2021-07-23 LAB — RENAL FUNCTION PANEL
Albumin: 2.8 g/dL — ABNORMAL LOW (ref 3.5–5.0)
Anion gap: 12 (ref 5–15)
BUN: 66 mg/dL — ABNORMAL HIGH (ref 8–23)
CO2: 27 mmol/L (ref 22–32)
Calcium: 9.2 mg/dL (ref 8.9–10.3)
Chloride: 95 mmol/L — ABNORMAL LOW (ref 98–111)
Creatinine, Ser: 9.11 mg/dL — ABNORMAL HIGH (ref 0.44–1.00)
GFR, Estimated: 5 mL/min — ABNORMAL LOW (ref >60)
Glucose, Bld: 108 mg/dL — ABNORMAL HIGH (ref 70–99)
Phosphorus: 6.4 mg/dL — ABNORMAL HIGH (ref 2.5–4.6)
Potassium: 4.9 mmol/L (ref 3.5–5.1)
Sodium: 134 mmol/L — ABNORMAL LOW (ref 135–145)

## 2021-07-23 LAB — CBC
HCT: 23.6 % — ABNORMAL LOW (ref 36.0–46.0)
Hemoglobin: 7.3 g/dL — ABNORMAL LOW (ref 12.0–15.0)
MCH: 31.2 pg (ref 26.0–34.0)
MCHC: 30.9 g/dL (ref 30.0–36.0)
MCV: 100.9 fL — ABNORMAL HIGH (ref 80.0–100.0)
Platelets: 183 10*3/uL (ref 150–400)
RBC: 2.34 MIL/uL — ABNORMAL LOW (ref 3.87–5.11)
RDW: 18.6 % — ABNORMAL HIGH (ref 11.5–15.5)
WBC: 5.6 10*3/uL (ref 4.0–10.5)
nRBC: 0 % (ref 0.0–0.2)

## 2021-07-23 LAB — GLUCOSE, CAPILLARY: Glucose-Capillary: 116 mg/dL — ABNORMAL HIGH (ref 70–99)

## 2021-07-23 LAB — HEMOGLOBIN AND HEMATOCRIT, BLOOD
HCT: 24.4 % — ABNORMAL LOW (ref 36.0–46.0)
Hemoglobin: 7.7 g/dL — ABNORMAL LOW (ref 12.0–15.0)

## 2021-07-23 MED ORDER — HYDROCORTISONE (PERIANAL) 2.5 % EX CREA
TOPICAL_CREAM | Freq: Two times a day (BID) | CUTANEOUS | 0 refills | Status: DC
  Filled 2021-07-23: qty 30, 15d supply, fill #0

## 2021-07-23 MED ORDER — CHLORHEXIDINE GLUCONATE CLOTH 2 % EX PADS
6.0000 | MEDICATED_PAD | Freq: Every day | CUTANEOUS | Status: DC
  Administered 2021-07-23: 6 via TOPICAL

## 2021-07-23 MED ORDER — SORBITOL 70 % SOLN
30.0000 mL | 0 refills | Status: DC
  Filled 2021-07-23: qty 473, 16d supply, fill #0

## 2021-07-23 MED ORDER — LIDOCAINE 5 % EX PTCH
1.0000 | MEDICATED_PATCH | Freq: Two times a day (BID) | CUTANEOUS | 0 refills | Status: DC | PRN
  Filled 2021-07-23: qty 30, 30d supply, fill #0

## 2021-07-23 MED ORDER — ISOSORBIDE MONONITRATE ER 60 MG PO TB24
60.0000 mg | ORAL_TABLET | Freq: Every day | ORAL | 0 refills | Status: DC
Start: 2021-07-24 — End: 2021-08-14
  Filled 2021-07-23: qty 30, 30d supply, fill #0

## 2021-07-23 MED ORDER — ACETAMINOPHEN 325 MG PO TABS
ORAL_TABLET | ORAL | Status: AC
  Administered 2021-07-23: 650 mg via ORAL
  Filled 2021-07-23: qty 2

## 2021-07-23 MED ORDER — SIMETHICONE 80 MG PO CHEW
80.0000 mg | CHEWABLE_TABLET | Freq: Four times a day (QID) | ORAL | Status: DC | PRN
  Administered 2021-07-23: 80 mg via ORAL
  Filled 2021-07-23: qty 1

## 2021-07-23 NOTE — Discharge Summary (Signed)
Name: Jodi Bell MRN: [id number] DOB: May 04, 1960 61 y.o. PCP: [name]., MD  Date of Admission: 07/16/2021 10:46 AM Date of Discharge: 07/23/21 Attending Physician: Velna Ochs, MD  Discharge Diagnosis: 1. 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 Hypertensive Emergency 3. Acute on Chronic Anemia 4. End Stage Renal Disease on MWF Hemodialysis 5. COPD, OSA (CPAP noncompliant) 6. History of Type 2 Diabetes Mellitus 7. GERD, History of bleeding esophageal ulcer, Esophagitis 8. Gout 9. Psoriatic Arthritis 10. Insomnia   Discharge Medications: Allergies as of 07/23/2021       Reactions   3-methyl-2-benzothiazolinone Hydrazone Other (See Comments)   Muscle weakness muscle cramping in legs Other reaction(s): Myalgias (Muscle Pain)   Banana Anaphylaxis, Swelling, Other (See Comments)   TONGUE AND MOUTH SWELLING   Black Walnut Flavor Anaphylaxis, Itching   Walnuts   Hazelnut (filbert) Allergy Skin Test Anaphylaxis   Hazelnuts   Leflunomide And Related Other (See Comments)   Severe Headache   Lisinopril Swelling   Angioedema  Swelling of the face   No [employer] Food Allergies Anaphylaxis   Spicy Mustard 4.7.2021 Pt reports that she eats Regular Yellow Mustard Swelling and itching of tongue   Other Other (See Comments), Anaphylaxis, Swelling   Cosentyx= angioedema Other reaction(s): Facial Edema (intolerance) Mouth itches and tongue swelling Hazel nuts and pecans also Walnuts Walnuts Renal failure Other reaction(s): Other Serotonin Syndrome  Serotonin syndrome  Tremors Other reaction(s): Other Tremors Muscle weakness muscle cramping in legs Other reaction(s): Myalgias (Muscle Pain) Mouth itches and tongue swelling Hazel nuts and pecans also Walnuts   Pecan Extract Allergy Skin Test Anaphylaxis, Itching   Pecans   Pecan Nut (diagnostic) Anaphylaxis, Itching   Pecans   Trazodone And Nefazodone Other (See Comments)    Serotonin Syndrome  Tremors   Adalimumab Other (See Comments)   Blisters on abdomen =humira   Escitalopram Oxalate Other (See Comments)   Hand problems - Serotonin Syndrome    Ezetimibe Other (See Comments)   myalgias cramps   Secukinumab Swelling   Cosentyx = angiodema   Statins Other (See Comments)   Leg pains and weakness   Ferrlecit [na Ferric Gluc Cplx In Sucrose]    Not reaction given from HD   Gabapentin Other (See Comments)   tremors   Ibuprofen Other (See Comments)   Renal Problems        Medication List     STOP taking these medications    ADVIL DUAL ACTION PO   hydrocortisone 25 MG suppository Commonly known as: ANUSOL-HC   lidocaine-prilocaine cream Commonly known as: EMLA       TAKE these medications    acetaminophen 500 MG tablet Commonly known as: TYLENOL Take 1,000 mg by mouth daily as needed (pain).   albuterol (2.5 MG/3ML) 0.083% nebulizer solution Commonly known as: PROVENTIL Take 3 mLs (2.5 mg total) by nebulization every 6 (six) hours as needed for wheezing or shortness of breath.   Ventolin HFA 108 (90 Base) MCG/ACT inhaler Generic drug: albuterol Inhale 2 puffs into the lungs See admin instructions. Take 2 puffs every 4-6 hours as needed for wheezing and shortness of breath   allopurinol 100 MG tablet Commonly known as: ZYLOPRIM Take 100 mg by mouth every evening.   amLODipine 5 MG tablet Commonly known as: NORVASC Take 1 tablet (5 mg total) by mouth daily.   calcium acetate 667 MG capsule Commonly known as: PHOSLO Take 1,334 mg by mouth See admin  instructions. Take 1,334 by mouth twice a day when able to remember.   carboxymethylcellulose 1 % ophthalmic solution Place 2 drops into both eyes 2 (two) times daily as needed (dry eyes).   cetirizine 10 MG tablet Commonly known as: ZYRTEC Take 10 mg by mouth daily as needed for allergies.   cinacalcet 30 MG tablet Commonly known as: SENSIPAR Take 30 mg by mouth daily.    cloNIDine 0.1 MG tablet Commonly known as: CATAPRES Take 0.1 mg by mouth 3 (three) times daily.   cyclobenzaprine 10 MG tablet Commonly known as: FLEXERIL Take 10 mg by mouth 2 (two) times daily as needed for muscle spasms.   Darbepoetin Alfa 200 MCG/0.4ML Sosy injection Commonly known as: ARANESP Inject 0.4 mLs (200 mcg total) into the vein every Wednesday with hemodialysis.   docusate sodium 100 MG capsule Commonly known as: COLACE Take 100-200 mg by mouth daily as needed for mild constipation.   ferrous sulfate 325 (65 FE) MG tablet Take 325 mg by mouth See admin instructions. Take 325 mg by mouth three times a week   folic acid 347 MCG tablet Commonly known as: FOLVITE Take 400 mcg by mouth daily.   gabapentin 300 MG capsule Commonly known as: Neurontin Take 1 capsule (300 mg total) by mouth at bedtime as needed (Neuropathic pain). What changed: when to take this   heparin 1000 unit/mL Soln injection 3 mLs (3,000 Units total) by Dialysis route as needed (in dialysis as needed).   heparin 1000 unit/mL Soln injection 2 mLs (2,000 Units total) by Dialysis route as needed (in dialysis as needed).   heparin 1000 unit/mL Soln injection 1 mL (1,000 Units total) by Dialysis route as needed (in dialysis).   hydrALAZINE 100 MG tablet Commonly known as: APRESOLINE Take 1 tablet (100 mg total) by mouth every 8 (eight) hours. What changed: when to take this   isosorbide mononitrate 60 MG 24 hr tablet Commonly known as: IMDUR Take 1 tablet (60 mg total) by mouth daily. Start taking on: July 24, 2021 What changed:  medication strength how much to take   labetalol 200 MG tablet Commonly known as: NORMODYNE Take 2 tablets (400 mg total) by mouth 3 (three) times daily.   lidocaine 5 % Commonly known as: LIDODERM Place 1 patch onto the skin every 12 (twelve) hours as needed. Remove & Discard patch within 12 hours or as directed by MD   LORazepam 1 MG tablet Commonly  known as: ATIVAN Take 1 mg by mouth every 8 (eight) hours as needed for anxiety.   montelukast 10 MG tablet Commonly known as: SINGULAIR Take 10 mg by mouth daily with supper.   olmesartan 40 MG tablet Commonly known as: BENICAR Take 40 mg by mouth every evening.   ondansetron 4 MG tablet Commonly known as: ZOFRAN Take 4 mg by mouth 2 (two) times daily as needed for nausea or vomiting.   OVER THE COUNTER MEDICATION Take by mouth daily as needed (sickness). Elderberry Fruit and flower 1 dropper full by mouth daily as needed for sickness   Oxycodone HCl 10 MG Tabs Take 10 mg by mouth daily as needed for pain.   OXYGEN Inhale 2 L into the lungs as directed.   pantoprazole 40 MG tablet Commonly known as: PROTONIX Take 40 mg by mouth daily.   polyethylene glycol powder 17 GM/SCOOP powder Commonly known as: GLYCOLAX/MIRALAX Mix 17 grams into 8 ounces of liquid and drink by mouth daily. What changed:  when to take  this reasons to take this   predniSONE 5 MG tablet Commonly known as: DELTASONE Take 4 tablets (20 mg total) by mouth daily with breakfast for 2 days, THEN 1 tablet (5 mg total) daily with breakfast. Start taking on: Jun 29, 2021 What changed: See the new instructions.   PROBIOTIC PO Take 1 capsule by mouth daily.   Proctozone-HC 2.5 % rectal cream Generic drug: hydrocortisone Place rectally 2 (two) times daily.   RENAL-VITE PO Take 1 tablet by mouth daily.   sorbitol 70 % Soln Take 30 ml 1-2 times daily as needed for constipation What changed: additional instructions   Spiriva Respimat 2.5 MCG/ACT Aers Generic drug: Tiotropium Bromide Monohydrate Inhale 2 puffs into the lungs 2 (two) times daily as needed (wheezing/SOB).   cholecalciferol 10 MCG (400 UNIT) Tabs tablet Commonly known as: VITAMIN D3 Take 800 Units by mouth daily.   Vitamin D3 25 MCG tablet Commonly known as: Vitamin D Take 1 tablet (1,000 Units total) by mouth every evening.    VOLTAREN EX Apply 1 application. topically daily as needed (pain).   zolpidem 5 MG tablet Commonly known as: AMBIEN Take 5 mg by mouth at bedtime as needed for sleep.        Disposition and follow-up:   Ms.Sarahmarie T Seltzer was discharged from [facility] in Stable condition.  At the hospital follow up visit please address:  1.   Anemia -Assess whether patient has been having worsening symptoms of anemia (dyspnea on exertion, dizziness on exertion) -CBC to ensure hemoglobin has been stable  Hypertension -Ensure patient's BP appropriately controlled with current antihypertensive regiment  Chest Pain -Ensure patient has had no further chest pain  -If painful, ensure that it is not reproducible with palpation  Hemodialysis -Ensure compliance with HD  2.  Labs / imaging needed at time of follow-up: H&H, CMP  3.  Pending labs/ test needing follow-up: none  Follow-up Appointments:  Follow-up Information     [name]., MD Follow up on 07/26/2021.   Specialty: Internal Medicine Why: 11am follow up Contact information: [address] [place] [address] [phone]         Leanor Kail, [place] Follow up on 08/02/2021.   Specialty: Cardiology Why: at 9:15AM for your cardiology follow up appointment Contact information: [place] [address] [phone]         Therapy, [facility] Follow up on 07/23/2021.   Why: 10:30 is your next apt for your physical therapy Contact information: [address]  [address] [place] [address] [place] by problem list: 1. 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 She described the pain as pressure and localized pain to the left side of her chest with radiation to her back. Symptoms likely due to  symptomatic hypertension as the patient presented with systolic pressures in the 200s. Ischemia unlikely given stable troponins at 15 and 17 with no evidence of ischemia on EKG. The patient's pain resolved during admission with improvement of her blood pressure. Cardiology was consulted on the patient and plans to see her as an outpatient for stress test.   2. Hypertensive Emergency Patient presented to the ED hypertensive to 218/112. She was started on a nitro  drip in the ED and then slowly transitioned to home meds: Labetalol 400 mg TID, Norvasc 5 mg QD, Hydralazine 100 mg TID, Clonidine 0.1 mg TID, Olmesartan 40 mg QD. Patient's blood pressure has been improving with her dialysis sessions to remove fluid. Plan for outpatient management of her hypertension at discharge.   3. GI Bleed Acute on Chronic Anemia,  Patient has known macrocytic anemia with baseline hemoglobin around 9.5 and MCV greater than 100. Patient's folate and B12 were within normal limits. Iron studies showed low TIBC in addition to a significantly elevated ferritin last year that could indicate anemia of chronic disease possibly secondary to her ESRD on hemodialysis. Reticulocyte index calculated to be 0.84 indicating hypoproliferative anemia. Over the course of admission, she developed melena and hematochezia and required transfusion of one unit of blood on two separate occasions. GI performed pill endoscopy that showed mild gastritis and minimal fresh blood with some old blood in the small bowel. Small bowel endoscopy showed no evidence of significant pathology in the duodenum or proximal jejunum. On patient's repeat CBCs, macrocytosis resolved, and the patient appeared to have more of a normocytic anemia. The patient is known to have internal and external hemorrhoids. Unclear whether this bleeding is secondary to internal hemorrhoids versus other GI bleed. On the day of discharge, the patient's hemoglobin was stable at 7.8. Will  encourage continued GI follow-up at discharge.    4. End Stage Renal Disease on MWF Hemodialysis Patient with history of ESRD on MWF dialysis. GFR was 11 on admission. Patient's CXR on admission showed pulmonary vascular congestion, patient also with elevated JVD to suggest fluid overload. Nephrology followed the patient throughout admission and gave her a few additional sessions of dialysis to pull fluid off, which appeared to improve her blood pressure. Patient's dry weight appears to be 64 kg. Plan to continue MWF HD as an outpatient with goal weight 64 kg.   5. COPD, OSA (CPAP noncompliant) Patient with longstanding COPD on 3 L supplemental oxygen at home in addition to tiotropium, montelukast, and Albuterol rescue inhalers. Last exacerbation was within the past month. Patient's O2 saturation remained >90% on 2L via nasal cannula. No evidence of COPD exacerbation during this hospital admisison.   6. History of Type 2 Diabetes Mellitus Patient with previous type 2 diabetes mellitus. Her A1C has been as high as 6.9 several years ago. Most recent A1C of 5.2 in August 2022. She is not currently on diabetes medication at home.    7. GERD, History of bleeding esophageal ulcer, Esophagitis Patient with history of GERD as well as esophagitis with bleeding esophageal ulcer in April 2021. Patient had a pill endoscopy during this admission that showed mild gastritis and minimal fresh blood with some old blood in the small bowel. Small bowel endoscopy showed no evidence of significant pathology in the duodenum or proximal jejunum.     8. Gout Patient with previous episodes of gout on Allopurinol at home. This was continued during admission and will be continued at discharge.   9. Psoriatic Arthritis Patient with osteoarthritis and psoriatic arthritis on prednisone 5 mg at home. This was continued during admission and will be continued at discharge.   10. Insomnia Patient with insomnia on Ambien at home.  This was continued prn during admission and will be continued at discharge.  Discharge Exam:   BP (!) 182/73 (BP Location: Right Arm)   Pulse 84   Temp 98.7 F (37.1 C) (Oral)   Resp 16  Ht 5\' 3"  (1.6 m)   Wt 66.4 kg   SpO2 100%   BMI 25.93 kg/m   Constitutional: well appearing and sitting in bed, in no acute distress HENT: normocephalic atraumatic, mucous membranes moist Eyes: pupils equal and round, conjunctiva non-erythematous Cardiovascular: regular rate with normal rhythm, 3/6 systolic murmur best heard over left upper sternal border, no rubs or gallops Pulmonary/Chest: normal work of breathing on room air, lungs clear to auscultation bilaterally Abdominal: soft, non-tender, non-distended, bowel sounds present MSK: normal bulk and tone, no peripheral edema Skin: warm and dry. Neurological: alert and answering questions appropriately. Psych: appropriate mood and affect   Pertinent Labs, Studies, and Procedures:   ECHOCARDIOGRAM COMPLETE  Result Date: 07/17/2021    ECHOCARDIOGRAM REPORT   Patient Name:   ZOYA SPRECHER Date of Exam: 07/17/2021 Medical Rec #:  [id number]        Height:       63.0 in Accession #:    [id number]       Weight:       144.4 lb Date of Birth:  May 09, 1960         BSA:          1.684 m Patient Age:    53 years         BP:           179/71 mmHg Patient Gender: F                HR:           71 bpm. Exam Location:  Inpatient Procedure: 2D Echo, Cardiac Doppler and Color Doppler Indications:     Chest pain  History:         Patient has prior history of Echocardiogram examinations, most                  recent 06/26/2021. CHF, CAD; Risk Factors:Hypertension, Diabetes                  and Sleep Apnea.  Sonographer:     Jefferey Pica Referring Phys:  [place] Diagnosing Phys: Gwyndolyn Kaufman MD IMPRESSIONS  1. Left ventricular ejection fraction, by estimation, is 65 to 70%. The left ventricle has normal function. The left ventricle has no regional wall  motion abnormalities. There is severe concentric left ventricular hypertrophy. Left ventricular diastolic  parameters are consistent with Grade II diastolic dysfunction (pseudonormalization). Elevated left atrial pressure.  2. Right ventricular systolic function is mildly reduced. The right ventricular size is normal. There is moderately elevated pulmonary artery systolic pressure. The estimated right ventricular systolic pressure is 26.3 mmHg which is higher than previous  TTE on 06/26/21 at 49mmHg. May be related to volume overload, however, PE is on the differential. Recommend clinical correlation.  3. Left atrial size was severely dilated.  4. Right atrial size was mildly dilated.  5. The mitral valve is degenerative. Mild mitral valve regurgitation. Severe mitral annular calcification.  6. Tricuspid valve regurgitation is mild to moderate.  7. The aortic valve is tricuspid. There is moderate calcification of the aortic valve. There is moderate thickening of the aortic valve. Aortic valve regurgitation is not visualized. Moderate aortic valve stenosis. Aortic valve mean gradient measures 36.0 mmHg. Aortic valve Vmax measures 4.11 m/s. AVA by continuity 1.4cm2, DI 0.45m/s. Suspect elevated mean gradient and Vmax in the setting of high output with LVOT VTI 40.4cm.  8. Aortic dilatation noted. There is borderline dilatation of the ascending aorta, measuring  39 mm.  9. The inferior vena cava is dilated in size with <50% respiratory variability, suggesting right atrial pressure of 15 mmHg. Comparison(s): Compared to prior TTE on 06/26/21, the PASP is now 55.10mmHg from 34.36mmHg and the RV is mildly hypokinetic. Elevated PASP may be related to volume overload, however, PE is on the differential. Otherwise, there continues to be moderate AS with similar gradients. FINDINGS  Left Ventricle: Left ventricular ejection fraction, by estimation, is 65 to 70%. The left ventricle has normal function. The left ventricle has no  regional wall motion abnormalities. The left ventricular internal cavity size was normal in size. There is  severe concentric left ventricular hypertrophy. Left ventricular diastolic parameters are consistent with Grade II diastolic dysfunction (pseudonormalization). Elevated left atrial pressure. Right Ventricle: The right ventricular size is normal. No increase in right ventricular wall thickness. Right ventricular systolic function is mildly reduced. There is moderately elevated pulmonary artery systolic pressure. The tricuspid regurgitant velocity is 3.17 m/s, and with an assumed right atrial pressure of 15 mmHg, the estimated right ventricular systolic pressure is 33.3 mmHg. Left Atrium: Left atrial size was severely dilated. Right Atrium: Right atrial size was mildly dilated. Pericardium: There is no evidence of pericardial effusion. Mitral Valve: The mitral valve is degenerative in appearance. Severe mitral annular calcification. Mild mitral valve regurgitation. Tricuspid Valve: The tricuspid valve is normal in structure. Tricuspid valve regurgitation is mild to moderate. Aortic Valve: The aortic valve is tricuspid. There is moderate calcification of the aortic valve. There is moderate thickening of the aortic valve. Aortic valve regurgitation is not visualized. Moderate aortic stenosis is present. Aortic valve mean gradient measures 36.0 mmHg. Aortic valve peak gradient measures 67.6 mmHg. Aortic valve area, by VTI measures 1.33 cm. Pulmonic Valve: The pulmonic valve was normal in structure. Pulmonic valve regurgitation is trivial. Aorta: Aortic dilatation noted. There is borderline dilatation of the ascending aorta, measuring 39 mm. Venous: The inferior vena cava is dilated in size with less than 50% respiratory variability, suggesting right atrial pressure of 15 mmHg. IAS/Shunts: The atrial septum is grossly normal.  LEFT VENTRICLE PLAX 2D LVIDd:         4.50 cm   Diastology LVIDs:         2.80 cm   LV e'  medial:    5.06 cm/s LV PW:         1.80 cm   LV E/e' medial:  35.2 LV IVS:        1.50 cm   LV e' lateral:   8.03 cm/s LVOT diam:     1.90 cm   LV E/e' lateral: 22.2 LV SV:         115 LV SV Index:   68 LVOT Area:     2.84 cm  RIGHT VENTRICLE            IVC RV Basal diam:  3.10 cm    IVC diam: 2.60 cm RV S prime:     5.12 cm/s TAPSE (M-mode): 1.2 cm LEFT ATRIUM              Index        RIGHT ATRIUM           Index LA diam:        4.80 cm  2.85 cm/m   RA Area:     19.50 cm LA Vol (A2C):   112.0 ml 66.52 ml/m  RA Volume:   53.70 ml  31.89 ml/m LA Vol (A4C):  103.0 ml 61.18 ml/m LA Biplane Vol: 109.0 ml 64.74 ml/m  AORTIC VALVE                     PULMONIC VALVE AV Area (Vmax):    1.44 cm      PV Vmax:       0.80 m/s AV Area (Vmean):   1.55 cm      PV Peak grad:  2.6 mmHg AV Area (VTI):     1.33 cm AV Vmax:           411.00 cm/s AV Vmean:          262.000 cm/s AV VTI:            0.858 m AV Peak Grad:      67.6 mmHg AV Mean Grad:      36.0 mmHg LVOT Vmax:         209.00 cm/s LVOT Vmean:        143.000 cm/s LVOT VTI:          0.404 m LVOT/AV VTI ratio: 0.47  AORTA Ao Root diam: 3.10 cm Ao Asc diam:  3.90 cm MITRAL VALVE                TRICUSPID VALVE MV Area (PHT): 4.41 cm     TR Peak grad:   40.2 mmHg MV Decel Time: 172 msec     TR Vmax:        317.00 cm/s MR Peak grad: 71.2 mmHg MR Mean grad: 6.0 mmHg      SHUNTS MR Vmax:      422.00 cm/s   Systemic VTI:  0.40 m MR Vmean:     114.0 cm/s    Systemic Diam: 1.90 cm MV E velocity: 178.00 cm/s MV A velocity: 113.00 cm/s MV E/A ratio:  1.58 Gwyndolyn Kaufman MD Electronically signed by Gwyndolyn Kaufman MD Signature Date/Time: 07/17/2021/10:37:52 AM    Final (Updated)    DG Chest Portable 1 View  Result Date: 07/16/2021 CLINICAL DATA:  Chest pain that radiates to the back. History of CHF. EXAM: PORTABLE CHEST 1 VIEW COMPARISON:  Chest radiograph dated Jun 27, 2021 FINDINGS: The heart is enlarged. Evidence of prior coronary artery bypass grafting.  Atherosclerotic calcification of the aortic arch. Prominence of the central pulmonary vessels. No appreciable pleural effusion or frank pulmonary edema. Sternotomy wires are intact. Partially imaged anterior cervical discectomy and fusion hardware. IMPRESSION: 1.  Stable cardiomegaly. 2. Pulmonary vascular congestion without evidence of focal consolidation or pleural effusion. Electronically Signed   By: Keane Police D.O.   On: 07/16/2021 11:32       Latest Ref Rng & Units 07/23/2021    2:16 PM 07/23/2021    2:41 AM 07/22/2021    2:07 AM  CBC  WBC 4.0 - 10.5 K/uL  5.6  5.0   Hemoglobin 12.0 - 15.0 g/dL 7.7  7.3  8.2   Hematocrit 36.0 - 46.0 % 24.4  23.6  26.4   Platelets 150 - 400 K/uL  183  181        Latest Ref Rng & Units 07/23/2021    8:32 AM 07/20/2021   11:29 PM 07/20/2021    2:01 AM  BMP  Glucose 70 - 99 mg/dL 108  110  110   BUN 8 - 23 mg/dL 66  35  20   Creatinine 0.44 - 1.00 mg/dL 9.11  4.99  3.15   Sodium 135 - 145 mmol/L 134  133  132   Potassium 3.5 - 5.1 mmol/L 4.9  4.2  3.7   Chloride 98 - 111 mmol/L 95  95  97   CO2 22 - 32 mmol/L 27  27  30    Calcium 8.9 - 10.3 mg/dL 9.2  8.9  8.7     Discharge Instructions: Discharge Instructions     Diet - low sodium heart healthy   Complete by: As directed    Discharge instructions   Complete by: As directed    Dear Ms. Goering,  It was a pleasure taking care of you while you are in the hospital.  You were admitted initially due to chest pain but we have checked that it is not due to a major cardiac problem.  However, it was noted that you had signs of GI bleeding given you had some dark stools.  You had an EGD, colonoscopy, capsule endoscopy, enteroscopy to assess your bowels and they did not find any specific source of your GI bleed.  However, it does appear that your hemoglobin (red blood cell level) has stopped going down and has slowly began to go up which is a good sign that you are no longer bleeding.    Please continue to  go to dialysis for your ESRD.  Please follow-up with your PCP and outpatient providers for your heart and abdominal problems. Please continue to take your medications as prescribed. We have increased your Imdur while you were in the hospital and it appears that has prevented any further chest pain.   Take Care!  -Norco IMTS   Increase activity slowly   Complete by: As directed        You were hospitalized for chest pain, high blood pressure, and anemia caused by blood in your stool. Thank you for allowing [place] to be part of your care.   Please follow up with your primary care doctor and with the cardiology team.   Your Imdur was increased to manage your chest pain. Please follow up with your outpatient cardiologist for further recommendations.   Please make sure to follow up with your primary doctor and cardiologist. Continue to attend dialysis sessions.   Please call our clinic if you have any questions or concerns, we may be able to help and keep you from a long and expensive emergency room wait. Our clinic and after hours phone number is [phone], the best time to call is Monday through Friday 9 am to 4 pm but there is always someone available 24/7 if you have an emergency. If you need medication refills please notify your pharmacy one week in advance and they will send [place] a request.   Signed: [name], MD 07/23/2021, 3:57 PM   Pager: [phone]

## 2021-07-23 NOTE — Progress Notes (Signed)
[facility] Progress Note   Subjective:   Seen in HD unit. Having some trouble with tele leads but no CP, palpitations, or dizziness. RRR on exam. Reports mild SOB this AM and abdomen feels "full." No abdominal pain, nausea and vomiting.   Objective Vitals:   07/23/21 0825 07/23/21 0830 07/23/21 0840 07/23/21 0851  BP: 120/68   (!) 129/55  Pulse: 83 62  66  Resp: 20 11  15   Temp: 98.8 F (37.1 C)     TempSrc: Oral     SpO2: 97% 99%  99%  Weight: 69.7 kg  69.7 kg   Height:       Physical Exam General: WDWN female, alert and in NAD Heart: RRR, no murmurs, rubs or gallops Lungs: CTA bilaterally without wheezing, rhonchi or rales. On 2L O2 via [place] Abdomen: Soft, non-distended, +BS. Trace flank edema Extremities: No edema bilateral lower extremities  Dialysis Access:  lue AVF, aneurysmal, +Bruit    Additional Objective Labs: Basic Metabolic Panel: Recent Labs  Lab 07/19/21 0329 07/20/21 0201 07/20/21 2329  NA 133* 132* 133*  K 4.4 3.7 4.2  CL 95* 97* 95*  CO2 27 30 27   GLUCOSE 114* 110* 110*  BUN 26* 20 35*  CREATININE 3.49* 3.15* 4.99*  CALCIUM 8.7* 8.7* 8.9   Liver Function Tests: No results for input(s): "AST", "ALT", "ALKPHOS", "BILITOT", "PROT", "ALBUMIN" in the last 168 hours. No results for input(s): "LIPASE", "AMYLASE" in the last 168 hours. CBC: Recent Labs  Lab 07/19/21 0329 07/19/21 1757 07/20/21 0201 07/20/21 2329 07/21/21 0926 07/22/21 0207 07/23/21 0241  WBC 3.9*  --  3.7* 4.3  --  5.0 5.6  HGB 7.1*   < > 7.6* 7.4* 7.8* 8.2* 7.3*  HCT 22.6*   < > 23.9* 23.6* 24.5* 26.4* 23.6*  MCV 100.0  --  98.0 98.7  --  100.0 100.9*  PLT 155  --  157 166  --  181 183   < > = values in this interval not displayed.   Blood Culture    Component Value Date/Time   SDES BLOOD SITE NOT SPECIFIED 06/26/2021 0650   SPECREQUEST  06/26/2021 0650    BOTTLES DRAWN AEROBIC AND ANAEROBIC Blood Culture results may not be optimal due to an excessive  volume of blood received in culture bottles   CULT  06/26/2021 0650    NO GROWTH 5 DAYS Performed at [facility], [place] [address], [place], [place] [address]    REPTSTATUS 07/01/2021 FINAL 06/26/2021 0650    Cardiac Enzymes: No results for input(s): "CKTOTAL", "CKMB", "CKMBINDEX", "TROPONINI" in the last 168 hours. CBG: Recent Labs  Lab 07/16/21 1123 07/22/21 0738 07/22/21 1127 07/22/21 1514  GLUCAP 73 89 90 91   Iron Studies: No results for input(s): "IRON", "TIBC", "TRANSFERRIN", "FERRITIN" in the last 72 hours. @lablastinr3 @ Studies/Results: No results found. Medications:  nitroGLYCERIN Stopped (07/16/21 1736)    allopurinol  100 mg Oral QPM   amLODipine  10 mg Oral Daily   cetirizine  10 mg Oral Daily   Chlorhexidine Gluconate Cloth  6 each Topical Q0600   Chlorhexidine Gluconate Cloth  6 each Topical Daily   cloNIDine  0.1 mg Oral TID   hydrALAZINE  100 mg Oral TID   hydrocortisone   Rectal BID   irbesartan  300 mg Oral Daily   isosorbide mononitrate  60 mg Oral Daily   labetalol  400 mg Oral TID   montelukast  10 mg Oral Q supper  pantoprazole  40 mg Oral Daily   predniSONE  5 mg Oral Q breakfast   umeclidinium bromide  1 puff Inhalation Daily    Assessment/Plan: 1.  Hypoxic respiratory failure: Secondary to volume overload in combination with acute exacerbation of COPD.  Respiratory status has improved and she is back to baseline with ongoing oxygen supplementation via nasal cannula. Continue UF with HD as tolerated. Reports max UF she can tolerate is 2.5L.  2. ESRD: She is routinely on a Monday/Wednesday/Friday hemodialysis schedule but treatment was rolled over to today due to endoscopy. Will resume MWF schedule tomorrow.  3. Anemia: Hemoglobin 7.3, has been in the 7-8 rnage, mildly improved.  Report of capsule endoscopy noted with mild gastritis/old blood in mid small bowel as well as colon plans, small bowl endoscopy on 6/12 was reportedly unremarkable.   4. CKD-MBD: Calcium level acceptable, monitor on phosphorus binder and Hectorol for PTH suppression. 5. Nutrition: Continue renal diet with ONS and fluid restriction. 6. Hypertension: BP elevated on arrival but improved with antihypertensive medications and hemodialysis  Anice Paganini, PA-C 07/23/2021, 8:59 AM  [facility] Pager: [phone]

## 2021-07-23 NOTE — TOC Transition Note (Addendum)
Transition of Care [facility]) - CM/SW Discharge Note   Patient Details  Name: Jodi Bell MRN: [id number] Date of Birth: Jul 31, 1960  Transition [facility]) CM/SW Contact:  Zenon Mayo, RN Phone Number: 07/23/2021, 3:46 PM   Clinical Narrative:    Patient is for dc today, she will  be resuming her outpatient physical therapy .  A script was given to patient to continue the outpatient physical therapy.  She has transport at dc.  Per [facility] physical therapy , states her next apt is 6/20 at 10:30 am, Staff RN states she will inform the patient as go over dc instructions.   Final next level of care: Home/Self Care Barriers to Discharge: No Barriers Identified   Patient Goals and CMS Choice Patient states their goals for this hospitalization and ongoing recovery are:: return home      Discharge Placement                       Discharge Plan and Services   Discharge Planning Services: CM Consult              DME Agency: NA       HH Arranged: NA          Social Determinants of Health (SDOH) Interventions     Readmission Risk Interventions    07/23/2021    3:40 PM 06/28/2021    5:10 PM  Readmission Risk Prevention Plan  Transportation Screening Complete Complete  Medication Review Press photographer) Complete Complete  PCP or Specialist appointment within 3-5 days of discharge Complete   HRI or [place] Complete Complete  SW Recovery Care/Counseling Consult Complete   Palliative Care Screening Not Applicable Not [place] Not Applicable Not Applicable

## 2021-07-23 NOTE — TOC Initial Note (Signed)
Transition of Care [facility]) - Initial/Assessment Note    Patient Details  Name: Jodi Bell MRN: [id number] Date of Birth: 07-17-1960  Transition of Care [facility]) CM/SW Contact:    Zenon Mayo, RN Phone Number: 07/23/2021, 3:43 PM  Clinical Narrative:                 From home with her daughter and her SIL and grand baby.  Patient states she still drives sometimes to her MD apts and sometimes her daughter takes her.  NCM offered choice for HHPT, she states she has been going to  [facility] outpatient to get her physical therapy  [phone] and would prefer to continue to go there.  She has home oxygen 2 liters continuously.  She has two walkers and she has a rollator, bsc that she uses for a shower chair, she also has a cane. She will have transportation at dc.  TOC will continue to follow for dc needs.    Per [facility] Physical therapy her next apt is on 6/13 at 10:30.  [phone].      Expected Discharge Plan: Home/Self Care Barriers to Discharge: No Barriers Identified   Patient Goals and CMS Choice Patient states their goals for this hospitalization and ongoing recovery are:: return home      Expected Discharge Plan and Services Expected Discharge Plan: Home/Self Care   Discharge Planning Services: CM Consult   Living arrangements for the past 2 months: Single Family Home Expected Discharge Date: 07/22/21                 DME Agency: NA       HH Arranged: NA          Prior Living Arrangements/Services Living arrangements for the past 2 months: Single Family Home   Patient language and need for interpreter reviewed:: Yes        Need for Family Participation in Patient Care: Yes (Comment) Care giver support system in place?: Yes (comment) Current home services:  (oxygen 3 l) Criminal Activity/Legal Involvement Pertinent to Current Situation/Hospitalization: No - Comment as needed  Activities of Daily Living      Permission  Sought/Granted                  Emotional Assessment Appearance:: Appears stated age Attitude/Demeanor/Rapport: Engaged Affect (typically observed): Appropriate Orientation: : Oriented to Self, Oriented to Place, Oriented to  Time, Oriented to Situation Alcohol / Substance Use: Not Applicable Psych Involvement: No (comment)  Admission diagnosis:  ESRD (end stage renal disease) ([place]) [N18.6] Chest pain [R07.9] Anemia, unspecified type [D64.9] Chest pain, unspecified type [R07.9] Hypertension, unspecified type [I10] GI bleed [K92.2] Patient Active Problem List   Diagnosis Date Noted   GI bleed 07/21/2021   Back pain 07/18/2021   Chest pain 07/16/2021   Adenovirus infection, unspecified    Volume overload 06/26/2021   COVID-19 02/13/2021   Coronary artery disease involving coronary bypass graft of native heart with angina pectoris ([place]) 01/10/2021   Paroxysmal atrial fibrillation ([place]) 01/10/2021   Aortic stenosis 01/10/2021   Mitral stenosis 01/10/2021   Acute exacerbation of CHF (congestive heart failure) ([place]) 01/02/2021   Pressure injury of skin 09/21/2020   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/13/2020   GI bleeding 05/17/2019   Volume overload state of heart 09/14/2018   Intervertebral cervical disc disorder with myelopathy, cervical region 09/14/2018   OSA (obstructive sleep apnea) 09/14/2018   Asthma 05/16/2018   Suspected COVID-19  virus infection 05/16/2018   ESRD (end stage renal disease) ([place]) 05/16/2018   Status post left partial knee replacement 11/12/2017   Melena 09/20/2017   GERD (gastroesophageal reflux disease) 09/20/2017   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 2 diabetes mellitus ([place]) 09/20/2017   COPD exacerbation ([place]) 09/20/2017   Tobacco use 09/20/2017   Anemia 09/20/2017   Acute respiratory failure with hypoxia ([place]) 09/28/2013   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 [address] Phone: [phone] Fax: [id number] - [place], [place] - [address] [place] [address] [place] [address] [place] [address] Phone: [phone] Fax: [phone]  [id number] - [address], [place] [address] Phone: [phone] Fax: [phone]     Social Determinants of Health ([place]) Interventions    Readmission Risk Interventions    07/23/2021    3:40 PM 06/28/2021    5:10 PM  Readmission Risk Prevention Plan  Transportation Screening Complete Complete  Medication Review Press photographer) Complete Complete  PCP or Specialist appointment within 3-5 days of discharge Complete   HRI or [place] Complete Complete  SW Recovery Care/Counseling Consult Complete   Palliative Care Screening Not Applicable Not [place] Not Applicable Not Applicable

## 2021-07-23 NOTE — Discharge Instructions (Addendum)
You were hospitalized for chest pain, high blood pressure, and anemia caused by blood in your stool. Thank you for allowing [place] to be part of your care.   Please follow up with your primary care doctor and with the cardiology team.   Your Imdur was increased to manage your chest pain. Please follow up with your outpatient cardiologist for further recommendations. We have also prescribed some lidocaine patches to help with your back pain.   Please make sure to follow up with your primary doctor and cardiologist. Continue to attend dialysis sessions.   Please call our clinic if you have any questions or concerns, we may be able to help and keep you from a long and expensive emergency room wait. Our clinic and after hours phone number is [phone], the best time to call is Monday through Friday 9 am to 4 pm but there is always someone available 24/7 if you have an emergency. If you need medication refills please notify your pharmacy one week in advance and they will send [place] a request.

## 2021-07-23 NOTE — Progress Notes (Signed)
D/C order noted. Contacted [facility] and spoke to [place]. Clinic advised pt will d/c today and resume care tomorrow. Clinic has access to [facility] Epic to obtain d/c summary and last renal note for continuation of care.   Melven Sartorius Renal Navigator [phone]

## 2021-07-23 NOTE — Progress Notes (Signed)
Hemodialysis called and report was given. Patient notified that transport will be on the way soon.

## 2021-07-23 NOTE — Plan of Care (Signed)
  Problem: Coping: Goal: Level of anxiety will decrease Outcome: Progressing   Problem: Pain Managment: Goal: General experience of comfort will improve Outcome: Progressing   

## 2021-07-23 NOTE — Progress Notes (Signed)
Mobility Specialist Progress Note:   07/23/21 0949  Mobility  Activity Off unit   Will follow-up as time allows.   [facility] Kera Deacon Mobility Specialist

## 2021-07-23 NOTE — Progress Notes (Signed)
Mobility Specialist Progress Note:   07/23/21 1432  Mobility  Activity Ambulated with assistance in room;Transferred from bed to chair  Level of Assistance Modified independent, requires aide device or extra time  Assistive Device None  Distance Ambulated (ft) 4 ft  Activity Response Tolerated well  $Mobility charge 1 Mobility   Pt received in bed willing to participate in mobility. Complaints of her back hurting. Pt declining hallway ambulation. Left in chair with call bell in reach and all needs met.   [facility] Eliodoro Gullett Mobility Specialist

## 2021-07-23 NOTE — Progress Notes (Signed)
Subjective:  Overnight Events: No acute events or concerns overnight.  Patient seen and evaluated on rounds this morning. She endorses some mild back pain consistent with chronic pain that she has, but she otherwise feels well.  Objective:  Vital signs in last 24 hours: Vitals:   07/22/21 2027 07/22/21 2236 07/22/21 2317 07/23/21 0408  BP: (!) 148/63 (!) 135/55  (!) 137/54  Pulse: 68   64  Resp: 16   15  Temp: 98.4 F (36.9 C)   98.9 F (37.2 C)  TempSrc: Oral   Oral  SpO2: 99%  100% 97%  Weight:    67.8 kg  Height:       Weight change: 4.467 kg  Intake/Output Summary (Last 24 hours) at 07/23/2021 4315 Last data filed at 07/22/2021 2200 Gross per 24 hour  Intake 894.74 ml  Output --  Net 894.74 ml    Physical Exam   Constitutional: well appearing and sitting in bed, in no acute distress HENT: normocephalic atraumatic, mucous membranes moist Eyes: pupils equal and round, conjunctiva non-erythematous Cardiovascular: regular rate with normal rhythm, 3/6 systolic murmur best heard over left upper sternal border, no rubs or gallops Pulmonary/Chest: normal work of breathing on room air, lungs clear to auscultation bilaterally Abdominal: soft, non-tender, non-distended, bowel sounds present MSK: normal bulk and tone, no peripheral edema Skin: warm and dry. Neurological: alert and answering questions appropriately. Psych: appropriate mood and affect   Assessment/Plan:  Principal Problem:   Melena Active Problems:   Essential hypertension   Anemia   ESRD (end stage renal disease) (HCC)   Chest pain   Back pain   GI bleed   Patient Summary: Jodi Bell is a 61 y.o. female with past medical history of hypertension, type 2 diabetes mellitus, COPD on 3 L supplemental oxygen at baseline, ESRD on HD MWF initally presenting with chest pain (now resolved) and continues to be hospitalized for GI Bleed   Subacute GI Bleeding Acute on Chronic macrocytic Anemia Hgb  trended up yesterday from 7.8 to 8.2, but now trended back down to 7.3. GI took the patient for small bowel endoscopy that showed no evidence of significant pathology in the entire examined duodenum. Also no evidence of significant pathology in the proximal jejunum examined up to 160 cm from insertion. GI has recommended monitoring H&H and transfusing as needed, they have signed off. Will recheck H&H today and plan for transfusion if <7 and discharge if stabilized. -Transfusion threshold <7   Chronic Severe Asymptomatic Hypertension SBP stable in 140-160s. Will continue to monitor. -HD per nephro recs -Labetalol 400 mg TID -Amlodipine 10 mg QD -Hydralazine 100 mg TID -Clonidine 0.1 mg TID -Irbesartan 300 mg QD   Non cardiac chest pain, resolved CAD History of NSTEMI with CABG x 2 in August 2022 -Cardiology outpatient follow-up for stress test -antihypertensives as above   Pulmonary Vascular Congestion End Stage Renal Disease on MWF Hemodialysis ESRD MWF. -Nephrology following, appreciate recommendations. -HD per nephro recs   Chronic Hypoxemic Respiratory Failure COPD OSA (CPAP noncompliant) Patient with longstanding COPD on 2-3 L O2 at home -PRN albuterol -Home montelukast -Home tiotropium -PT/OT eval and treat   History of Type 2 Diabetes Mellitus Most recent A1C of 5.2 in August 2022. Not on diabetes medications at home.   GERD History of bleeding esophageal ulcer History of Esophagitis -Pantoprazole 40 mg qd   Gout -Continue home Allopurinol 100 mg.    Psoriatic Arthritis -Continue home Prednisone 5 mg  Insomnia -Continue home ambien qhs prn   Chronic Lumbar Pain -Continue home oxy 10 q12 hr prn -Lidocaine Patch -Robaxin 500 q8h prn   Seasonal Allergies -Cetirizine 5 mg qd   Diet: Low sodium diet VTE: SCD IVF: None Code: Full   Prior to Admission Living Arrangement: Home Anticipated Discharge Location: TBD Barriers to Discharge: Medical  Stability Dispo: Anticipated discharge in approximately 1-2 day(s).   LOS: 2 days   Jodi Bell, Medical Student 07/23/2021, 7:04 AM

## 2021-07-24 ENCOUNTER — Other Ambulatory Visit (HOSPITAL_BASED_OUTPATIENT_CLINIC_OR_DEPARTMENT_OTHER): Payer: Medicare PPO

## 2021-07-24 ENCOUNTER — Encounter (HOSPITAL_COMMUNITY): Payer: Self-pay | Admitting: Gastroenterology

## 2021-08-02 ENCOUNTER — Ambulatory Visit: Payer: Medicare PPO | Admitting: Physician Assistant

## 2021-08-14 MED ORDER — ISOSORBIDE MONONITRATE ER 60 MG PO TB24: 60.0000 mg | ORAL_TABLET | Freq: Every day | ORAL | 3 refills | Status: DC

## 2021-08-20 ENCOUNTER — Telehealth: Payer: Self-pay | Admitting: Family Medicine

## 2021-08-20 NOTE — Telephone Encounter (Signed)
Reviewed patients information she was never a patient here.  Never saw Dr Nori Riis in [place].  Her pcp is Dr [facility] in [facility]  P#[phone].  Make this documention in error.  Have already called patient and left message in regards to her paperwork and what dr is her pcp. Which isn't Dr Nori Riis and also stated that her insurance paperwork that she wanted signed is at front desk for her to pickup and bring to her correct pcp is Dr [facility] in [facility].

## 2021-08-20 NOTE — Telephone Encounter (Signed)
Patient came in with insurance form stated saw dr. Nori Riis in [place].  Advised her she hasn't been seen here but insists that she saw dr Nori Riis and was told to bring paperwork here.  Any questions call patient [phone]

## 2021-08-27 ENCOUNTER — Ambulatory Visit (HOSPITAL_BASED_OUTPATIENT_CLINIC_OR_DEPARTMENT_OTHER)
Admission: RE | Admit: 2021-08-27 | Discharge: 2021-08-27 | Disposition: A | Discharge status: Home / Self Care | Payer: Medicare PPO | Source: Ambulatory Visit | Attending: Pulmonary Disease | Admitting: Pulmonary Disease

## 2021-08-27 DIAGNOSIS — J984 Other disorders of lung: Secondary | ICD-10-CM | POA: Insufficient documentation

## 2021-08-28 NOTE — Progress Notes (Signed)
CT scan shows no significant findings - lungs appear healthy, small nodules are stable no further imaging needed.

## 2021-09-12 ENCOUNTER — Encounter: Payer: Self-pay | Admitting: Pulmonary Disease

## 2021-09-12 ENCOUNTER — Ambulatory Visit (INDEPENDENT_AMBULATORY_CARE_PROVIDER_SITE_OTHER): Payer: Medicare PPO | Admitting: Pulmonary Disease

## 2021-09-12 VITALS — BP 162/78 | HR 71 | Temp 98.5°F | Ht 62.75 in | Wt 141.4 lb

## 2021-09-12 DIAGNOSIS — I5033 Acute on chronic diastolic (congestive) heart failure: Secondary | ICD-10-CM

## 2021-09-12 DIAGNOSIS — J9601 Acute respiratory failure with hypoxia: Secondary | ICD-10-CM | POA: Diagnosis not present

## 2021-09-12 NOTE — Progress Notes (Signed)
@Patient  ID: Jodi Bell, female    DOB: Apr 08, 1960, 61 y.o.   MRN: [id number]  Chief Complaint  Patient presents with   Follow-up    Sob on exertion    Referring provider: [name]., MD  HPI:   61 y.o. woman whom we are seeing in hospital follow-up and for follow up for evaluation of dyspnea on exertion, chronic hypoxemic respiratory failure. Recent cardiology note reviewed.  Overall doing well.  Hospitalized 07/2021.  H&P, discharge summary reviewed.  Found to be dyspneic.  Actually found to have significant anemia.  Since discharge, her hemoglobin was, by 2 points per her report.  She is feeling better.  More energetic.  Less short of breath.  In the hospital she had a TTE that showed evidence of RV dysfunction and elevated RVSP.  This was different and worse than TTE 06/2021, 1 month, 3 weeks prior.  Overall pleased she is improving.  Discussed at length pulmonary hypertension and the impact on her health and the etiology is a contributor in her extremities.  And the difficulties with treatment but consideration treatment in the future.  HPI at initial visit: She has chronic dyspnea.  Using oxygen for couple months now.  Checks her oxygen saturation usually stays okay while at rest, occasionally drops while at rest.  Usually drops when moving.  Some days are better than others.  Despite using 2 L occasionally drops into the 80s with exertion per her report.  She was walk today 3 laps around the office and did not desaturate below 95%.  Reviewed at length her prior spirometry and chest imaging.  Spirometry does not show COPD.  Chest imaging repeatedly shows evidence of volume overload and pulmonary edema.  Discussed this is most likely the cause for her hypoxemia.  Suspect it waxes and wanes based on her volume status as it relates to the timing of her dialysis.  She was counseled the same.  Reviewed most recent CT chest 12-2020 that shows evidence of volume overload with interlobular  septal thickening and small bilateral pleural effusions without evidence of ILD or other parenchymal abnormality.  Most recent chest x-ray 02/2021 reviewed which shows peribronchovascular opacities most consistent with pulmonary edema on my interpretation.  Reviewed TTE most recent 12/2020 that shows severe left atrial dilation, grade 2 diastolic dysfunction, elevated left atrial pressure, aortic stenosis, mitral valve regurgitation and mitral valve stenosis as well as enlarged RV with reduced function.  Counseled most likely reason for her dyspnea is volume overloaded as a relates to her chronic heart failure worsened as she cannot excrete her own urine with relied on dialysis for volume removal.  PMH: ESRD on HD, chronic diastolic heart failure, mitral and aortic valve abnormalities, hypertension Surgical history: Knee replacement, CABG Family history: Mother with history of lung cancer Social history: Former smoker, 20-pack-year history, quit 2022, lives in [place] / Pulmonary Flowsheets:   ACT:      No data to display           MMRC:     No data to display           Epworth:      No data to display           Tests:   FENO:  No results found for: "NITRICOXIDE"  PFT:    Latest Ref Rng & Units 07/10/2021    9:35 AM 09/11/2020    8:33 AM  PFT Results  FVC-Pre L 1.10  1.28   FVC-Predicted Pre % 47  52   FVC-Post L 1.25    FVC-Predicted Post % 54    Pre FEV1/FVC % % 83  87   Post FEV1/FCV % % 83    FEV1-Pre L 0.91  1.11   FEV1-Predicted Pre % 50  57   FEV1-Post L 1.03    DLCO uncorrected ml/min/mmHg 8.68    DLCO UNC% % 47    DLCO corrected ml/min/mmHg 10.74    DLCO COR %Predicted % 59    DLVA Predicted % 89    TLC L 2.75    TLC % Predicted % 59    RV % Predicted % 77     07/10/21 personally reviewed interpreted as: Spirometry indicative of severe restriction versus air trapping without significant bronchodilator response.  Lung volumes  confirm moderate restriction.  DLCO is moderately reduced when corrected for hemoglobin.  Prior spirometry 09/2020 reviewed interpreted as no fixed obstruction, suggestive of moderate restriction versus gas trapping.  WALK:      No data to display           Imaging: Personally reviewed and as per EMR discussion this note CT Chest High Resolution  Result Date: 08/28/2021 CLINICAL DATA:  Restrictive lung disease, reduced DLCO, evaluate for interstitial lung disease, suspicious for post COVID fibrosis EXAM: CT CHEST WITHOUT CONTRAST TECHNIQUE: Multidetector CT imaging of the chest was performed following the standard protocol without intravenous contrast. High resolution imaging of the lungs, as well as inspiratory and expiratory imaging, was performed. RADIATION DOSE REDUCTION: This exam was performed according to the departmental dose-optimization program which includes automated exposure control, adjustment of the mA and/or kV according to patient size and/or use of iterative reconstruction technique. COMPARISON:  01/02/2021, 09/23/2020 FINDINGS: Cardiovascular: Aortic atherosclerosis. Dense aortic valve calcifications. Cardiomegaly. Extensive three-vessel coronary artery calcifications status post median sternotomy and CABG. Enlargement of the main pulmonary artery measuring up to 3.9 cm in caliber. No pericardial effusion. Mediastinum/Nodes: Unchanged prominent mediastinal and hilar lymph nodes. Thyroid gland, trachea, and esophagus demonstrate no significant findings. Lungs/Pleura: Stable, small bilateral pulmonary nodules measuring up to 0.7 cm in the right lung base (series 6, image 80). No evidence of fibrotic interstitial lung disease. Mild, bland appearing bandlike scarring of the bilateral lung bases (series 5, image 81). No significant air trapping on expiratory phase imaging. No pleural effusion or pneumothorax. Upper Abdomen: No acute abnormality. Musculoskeletal: No chest wall  abnormality. No suspicious osseous lesions identified. IMPRESSION: 1. No evidence of fibrotic interstitial lung disease. Mild, bland appearing bandlike scarring of the bilateral lung bases. No specific features to suggest post COVID fibrosis. 2. Stable, definitively benign bilateral pulmonary nodules for which no further follow-up or characterization is required. 3. Enlargement of the main pulmonary artery, as can be seen in pulmonary hypertension. 4. Cardiomegaly and coronary artery disease. 5. Dense aortic valve calcifications. Correlate for echocardiographic evidence of aortic valve dysfunction. Aortic Atherosclerosis (ICD10-I70.0). Electronically Signed   By: Delanna Ahmadi M.D.   On: 08/28/2021 15:02    Lab Results: Personally reviewed CBC    Component Value Date/Time   WBC 5.6 07/23/2021 0241   RBC 2.34 (L) 07/23/2021 0241   HGB 7.7 (L) 07/23/2021 1416   HCT 24.4 (L) 07/23/2021 1416   PLT 183 07/23/2021 0241   MCV 100.9 (H) 07/23/2021 0241   MCV 94.5 10/12/2012 1211   MCH 31.2 07/23/2021 0241   MCHC 30.9 07/23/2021 0241   RDW 18.6 (H) 07/23/2021 0241   LYMPHSABS  0.3 (L) 06/28/2021 0346   MONOABS 0.3 06/28/2021 0346   EOSABS 0.0 06/28/2021 0346   BASOSABS 0.0 06/28/2021 0346    BMET    Component Value Date/Time   NA 134 (L) 07/23/2021 0832   K 4.9 07/23/2021 0832   CL 95 (L) 07/23/2021 0832   CO2 27 07/23/2021 0832   GLUCOSE 108 (H) 07/23/2021 0832   BUN 66 (H) 07/23/2021 0832   CREATININE 9.11 (H) 07/23/2021 0832   CREATININE 1.34 (H) 09/18/2011 1400   CALCIUM 9.2 07/23/2021 0832   GFRNONAA 5 (L) 07/23/2021 0832   GFRAA 16 (L) 11/09/2019 1152    BNP    Component Value Date/Time   BNP >4,500.0 (H) 01/02/2021 1006    ProBNP No results found for: "PROBNP"  Specialty Problems       Pulmonary Problems   Acute respiratory failure with hypoxia (HCC)   COPD exacerbation (HCC)   Asthma   OSA (obstructive sleep apnea)    Allergies  Allergen Reactions    3-Methyl-2-Benzothiazolinone Hydrazone Other (See Comments)    Muscle weakness muscle cramping in legs Other reaction(s): Myalgias (Muscle Pain)   Banana Anaphylaxis, Swelling and Other (See Comments)    TONGUE AND MOUTH SWELLING   Black Walnut Flavor Anaphylaxis and Itching    Walnuts   Hazelnut ([place]) Allergy Skin Test Anaphylaxis    Hazelnuts   Leflunomide And Related Other (See Comments)    Severe Headache   Lisinopril Swelling    Angioedema  Swelling of the face   No [employer] Food Allergies Anaphylaxis    Spicy Mustard 4.7.2021 Pt reports that she eats Regular Yellow Mustard Swelling and itching of tongue   Other Other (See Comments), Anaphylaxis and Swelling    Cosentyx= angioedema  Other reaction(s): Facial Edema (intolerance) Mouth itches and tongue swelling Hazel nuts and pecans also Walnuts Walnuts Renal failure Other reaction(s): Other Serotonin Syndrome  Serotonin syndrome  Tremors Other reaction(s): Other Tremors Muscle weakness muscle cramping in legs Other reaction(s): Myalgias (Muscle Pain) Mouth itches and tongue swelling Hazel nuts and pecans also Walnuts    Pecan Extract Allergy Skin Test Anaphylaxis and Itching    Pecans    Pecan Nut (Diagnostic) Anaphylaxis and Itching    Pecans   Trazodone And Nefazodone Other (See Comments)    Serotonin Syndrome  Tremors   Adalimumab Other (See Comments)    Blisters on abdomen =humira   Escitalopram Oxalate Other (See Comments)    Hand problems - Serotonin Syndrome     Ezetimibe Other (See Comments)    myalgias cramps    Secukinumab Swelling    Cosentyx = angiodema    Statins Other (See Comments)    Leg pains and weakness   Ferrlecit [Na Ferric Gluc Cplx In Sucrose]     Not reaction given from HD   Gabapentin Other (See Comments)    tremors   Ibuprofen Other (See Comments)    Renal Problems    Immunization History  Administered Date(s) Administered   Influenza Split 12/25/2008,  11/12/2010, 11/06/2012, 11/10/2013, 01/02/2014, 11/11/2015, 11/13/2017, 10/29/2018   Influenza, Seasonal, Injecte, Preservative Fre 12/25/2008, 11/12/2010, 11/06/2012   Influenza,inj,Quad PF,6+ Mos 01/02/2014, 11/13/2017, 10/29/2018   Influenza,inj,Quad PF,6-35 Mos 11/24/2011, 10/27/2012, 11/03/2012, 10/29/2018   Influenza,inj,quad, With Preservative 11/01/2019   Influenza-Unspecified 12/25/2008, 11/12/2010, 11/06/2012, 11/10/2013, 01/02/2014, 11/11/2015, 11/13/2017, 10/29/2018   Moderna Sars-Covid-2 Vaccination 04/30/2019, 06/01/2019   Pneumococcal Conjugate-13 03/17/2010, 07/01/2019   Pneumococcal Polysaccharide-23 02/10/2005, 12/24/2008, 12/11/2012, 04/21/2013, 03/09/2020   Tdap 01/13/2018    Past  Medical History:  Diagnosis Date   Anemia of chronic disease    Anxiety    Arthritis    oa and psoriatic ra   Asthma    Back pain, chronic    lower back   Candida infection finished tx 2 days ago   Chronic insomnia    COPD (chronic obstructive pulmonary disease) (HCC)    Coronary artery calcification seen on CT scan    Diabetes mellitus    type 2   Eczema    ESRD on dialysis [facility])    Essential hypertension    GERD (gastroesophageal reflux disease)    Gout    History of hiatal hernia    Hypoalbuminemia    Hyponatremia    Mild aortic stenosis    Mitral regurgitation    Mitral stenosis    Obesity    PAD (peripheral artery disease) ([place])    a. externial iliac calcification seen on CT 04/2020.   Pneumonia few yrs ago x 2   Sleep apnea    Tobacco abuse    Upper GI bleed 05/2019    Tobacco History: Social History   Tobacco Use  Smoking Status Former   Packs/day: 0.50   Years: 40.00   Total pack years: 20.00   Types: Cigarettes   Quit date: 08/2020   Years since quitting: 1.0  Smokeless Tobacco Never   Counseling given: Not Answered   Continue to not smoke  Outpatient Encounter Medications as of 09/12/2021  Medication Sig   acetaminophen (TYLENOL) 500 MG tablet  Take 1,000 mg by mouth daily as needed (pain).   albuterol (PROVENTIL) (2.5 MG/3ML) 0.083% nebulizer solution Take 3 mLs (2.5 mg total) by nebulization every 6 (six) hours as needed for wheezing or shortness of breath.   allopurinol (ZYLOPRIM) 100 MG tablet Take 100 mg by mouth every evening.   amLODipine (NORVASC) 10 MG tablet Take 1 tablet by mouth daily.   calcium acetate (PHOSLO) 667 MG capsule Take 1,334 mg by mouth See admin instructions. Take 1,334 by mouth twice a day when able to remember.   carboxymethylcellulose 1 % ophthalmic solution Place 2 drops into both eyes 2 (two) times daily as needed (dry eyes).   cetirizine (ZYRTEC) 10 MG tablet Take 10 mg by mouth daily as needed for allergies.   cholecalciferol (VITAMIN D) 25 MCG tablet Take 1 tablet (1,000 Units total) by mouth every evening.   cholecalciferol (VITAMIN D3) 10 MCG (400 UNIT) TABS tablet Take 800 Units by mouth daily.   cinacalcet (SENSIPAR) 60 MG tablet Take 60 mg by mouth daily.   cloNIDine (CATAPRES) 0.2 MG tablet Take 0.2 mg by mouth 3 (three) times daily.   cyclobenzaprine (FLEXERIL) 10 MG tablet Take 10 mg by mouth 2 (two) times daily as needed for muscle spasms.   Darbepoetin Alfa (ARANESP) 200 MCG/0.4ML SOSY injection Inject 0.4 mLs (200 mcg total) into the vein every Wednesday with hemodialysis.   Diclofenac Sodium (VOLTAREN EX) Apply 1 application. topically daily as needed (pain).   docusate sodium (COLACE) 100 MG capsule Take 100-200 mg by mouth daily as needed for mild constipation.   ferrous sulfate 325 (65 FE) MG tablet Take 325 mg by mouth See admin instructions. Take 325 mg by mouth three times a week   folic acid (FOLVITE) 347 MCG tablet Take 400 mcg by mouth daily.   heparin 1000 unit/mL SOLN injection 3 mLs (3,000 Units total) by Dialysis route as needed (in dialysis as needed).   heparin 1000 unit/mL  SOLN injection 2 mLs (2,000 Units total) by Dialysis route as needed (in dialysis as needed).   heparin  1000 unit/mL SOLN injection 1 mL (1,000 Units total) by Dialysis route as needed (in dialysis).   hydrALAZINE (APRESOLINE) 100 MG tablet Take 1 tablet (100 mg total) by mouth every 8 (eight) hours. (Patient taking differently: Take 100 mg by mouth 3 (three) times daily.)   hydrocortisone (ANUSOL-HC) 2.5 % rectal cream Place rectally 2 (two) times daily.   isosorbide mononitrate (IMDUR) 60 MG 24 hr tablet Take 1 tablet (60 mg total) by mouth daily.   labetalol (NORMODYNE) 200 MG tablet Take 2 tablets (400 mg total) by mouth 3 (three) times daily.   lidocaine (LIDODERM) 5 % Place 1 patch onto the skin every 12 (twelve) hours as needed. Remove & Discard patch within 12 hours or as directed by MD   LORazepam (ATIVAN) 1 MG tablet Take 1 mg by mouth every 8 (eight) hours as needed for anxiety.   montelukast (SINGULAIR) 10 MG tablet Take 10 mg by mouth daily with supper.    olmesartan (BENICAR) 40 MG tablet Take 40 mg by mouth every evening.   ondansetron (ZOFRAN) 4 MG tablet Take 4 mg by mouth 2 (two) times daily as needed for nausea or vomiting.   OVER THE COUNTER MEDICATION Take by mouth daily as needed (sickness). Elderberry Fruit and flower 1 dropper full by mouth daily as needed for sickness   Oxycodone HCl 10 MG TABS Take 10 mg by mouth daily as needed for pain.   OXYGEN Inhale 2 L into the lungs as directed.   pantoprazole (PROTONIX) 40 MG tablet Take 40 mg by mouth daily.   polyethylene glycol powder (GLYCOLAX/MIRALAX) 17 GM/SCOOP powder Mix 17 grams into 8 ounces of liquid and drink by mouth daily. (Patient taking differently: Take 17 g by mouth daily as needed (constipation).)   predniSONE (DELTASONE) 5 MG tablet Take 4 tablets (20 mg total) by mouth daily with breakfast for 2 days, THEN 1 tablet (5 mg total) daily with breakfast. (Patient taking differently: Take 1 tablet (5 mg total) daily with breakfast.)   Probiotic Product (PROBIOTIC PO) Take 1 capsule by mouth daily.   sorbitol 70 % SOLN  Take 30 ml 1-2 times daily as needed for constipation   Tiotropium Bromide Monohydrate (SPIRIVA RESPIMAT) 2.5 MCG/ACT AERS Inhale 2 puffs into the lungs 2 (two) times daily as needed (wheezing/SOB).   VENTOLIN HFA 108 (90 Base) MCG/ACT inhaler Inhale 2 puffs into the lungs See admin instructions. Take 2 puffs every 4-6 hours as needed for wheezing and shortness of breath   zolpidem (AMBIEN) 5 MG tablet Take 5 mg by mouth at bedtime as needed for sleep.   amLODipine (NORVASC) 5 MG tablet Take 1 tablet (5 mg total) by mouth daily.   B Complex-C-Folic Acid (RENAL-VITE PO) Take 1 tablet by mouth daily. (Patient not taking: Reported on 07/16/2021)   cinacalcet (SENSIPAR) 30 MG tablet Take 30 mg by mouth daily.   cloNIDine (CATAPRES) 0.1 MG tablet Take 0.1 mg by mouth 3 (three) times daily.   gabapentin (NEURONTIN) 300 MG capsule Take 1 capsule (300 mg total) by mouth at bedtime as needed (Neuropathic pain). (Patient taking differently: Take 300 mg by mouth daily as needed (Neuropathic pain).)   No facility-administered encounter medications on file as of 09/12/2021.     Review of Systems  Review of Systems  N/a Physical Exam  BP (!) 162/78 (BP Location: Right Arm, Cuff  Size: Normal)   Pulse 71   Temp 98.5 F (36.9 C) (Temporal)   Ht 5' 2.75" (1.594 m)   Wt 141 lb 6.4 oz (64.1 kg)   SpO2 96%   BMI 25.25 kg/m   Wt Readings from Last 5 Encounters:  09/12/21 141 lb 6.4 oz (64.1 kg)  07/23/21 146 lb 6.2 oz (66.4 kg)  07/11/21 146 lb (66.2 kg)  07/10/21 140 lb 6.4 oz (63.7 kg)  06/28/21 135 lb 2.3 oz (61.3 kg)    BMI Readings from Last 5 Encounters:  09/12/21 25.25 kg/m  07/23/21 25.93 kg/m  07/11/21 27.59 kg/m  07/10/21 26.53 kg/m  06/28/21 24.72 kg/m     Physical Exam Sitting in chair, in no acute distress Eyes: EOMI, no icterus Neck: Supple, JVP Pulmonary: Normal work of breathing, clear Abdomen: Nondistended, bowel sounds present MSK: No synovitis, joint  effusion Abdomen: Nondistended, bowel sounds present Neuro: Normal gait, no weakness Psych: Normal mood, full affect   Assessment & Plan:   Dyspnea on exertion: Likely related to cardiac causes as primary driver given left atrial dilation, elevated left atrial pressure, mitral and aortic valvular abnormalities with serial chest imaging demonstrating pulmonary edema.  Prior spirometry without fixed obstruction.  Full PFT 07/10/2021 concerning for moderate restriction with moderately decreased DLCO concerning for ILD.  CT high res 07/2021 rules out ILD, none present.  While TTE 06/2021 estimated normal RV pressures, preceding TTE 12/2020 and TTE 65/2023 demonstrate elevated RV pressures - so suspect 06/2021 results are spurious.  Chronic hypoxemic respiratory failure: 2-3 L with exertion per her report.  Using POC. No ILD on serial images includign CT high res 07/2021.  Imaging consistently shows pulmonary edema and volume overload.  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  Volume overload most likely contributor.  Possible contribution of VQ mismatch with presumed pulmonary HTN.  Possible pulmonary hypertension: Multifactorial and largely by group 3 disease exacerbated by dialysis, grade 5 disease with possible increase post AV fistula, group 3 disease with hypoxemia.  Do not think she is good candidate for treatment of multifactorial nature of disease and dilated left atrium.  Ongoing discussions.   Return in about 3 months (around 12/13/2021).   Lanier Clam, MD 09/12/2021   I spent 42 minutes in the care of the patient including face-to-face visit, review of records, coordination of care.

## 2021-09-12 NOTE — Patient Instructions (Signed)
Nice to see you again  Please use Spiriva 2 puffs once a day every day  You can use albuterol 2 puffs as needed every 6 hours for wheeze or shortness of breath.  My hope is that if you Spiriva regularly he will find less need for the albuterol.  I will message the heart doctor and kidney doctor.  There are some signs on your heart for sound of a condition called pulmonary hypertension.  Overall since you are improving I do not recommend any further evaluation at this time.  If pulmonary hypertension is present, it may be difficult to treat given issues with the heart and on dialysis.  But we can cross that bridge if we need to in the future.  Return to clinic in 3 months or sooner as needed with Dr. Silas Flood

## 2021-09-17 ENCOUNTER — Other Ambulatory Visit (HOSPITAL_COMMUNITY): Payer: Self-pay

## 2021-09-19 ENCOUNTER — Ambulatory Visit: Payer: Medicare PPO | Admitting: Cardiology

## 2021-10-02 ENCOUNTER — Other Ambulatory Visit: Payer: Self-pay

## 2021-10-02 ENCOUNTER — Encounter (HOSPITAL_COMMUNITY): Payer: Self-pay

## 2021-10-02 ENCOUNTER — Emergency Department (HOSPITAL_COMMUNITY): Payer: Medicare PPO

## 2021-10-02 ENCOUNTER — Emergency Department (HOSPITAL_COMMUNITY)
Admission: EM | Admit: 2021-10-02 | Discharge: 2021-10-02 | Disposition: A | Discharge status: Home / Self Care | Payer: Medicare PPO | Attending: Emergency Medicine | Admitting: Emergency Medicine

## 2021-10-02 DIAGNOSIS — Z992 Dependence on renal dialysis: Secondary | ICD-10-CM | POA: Diagnosis not present

## 2021-10-02 DIAGNOSIS — R072 Precordial pain: Secondary | ICD-10-CM | POA: Diagnosis not present

## 2021-10-02 DIAGNOSIS — I120 Hypertensive chronic kidney disease with stage 5 chronic kidney disease or end stage renal disease: Secondary | ICD-10-CM | POA: Diagnosis not present

## 2021-10-02 DIAGNOSIS — I10 Essential (primary) hypertension: Secondary | ICD-10-CM

## 2021-10-02 DIAGNOSIS — Z79899 Other long term (current) drug therapy: Secondary | ICD-10-CM | POA: Insufficient documentation

## 2021-10-02 DIAGNOSIS — N186 End stage renal disease: Secondary | ICD-10-CM | POA: Diagnosis not present

## 2021-10-02 DIAGNOSIS — D696 Thrombocytopenia, unspecified: Secondary | ICD-10-CM | POA: Diagnosis not present

## 2021-10-02 LAB — TROPONIN I (HIGH SENSITIVITY)
Troponin I (High Sensitivity): 23 ng/L — ABNORMAL HIGH (ref <18)
Troponin I (High Sensitivity): 25 ng/L — ABNORMAL HIGH (ref <18)

## 2021-10-02 LAB — BASIC METABOLIC PANEL
Anion gap: 14 (ref 5–15)
BUN: 12 mg/dL (ref 8–23)
CO2: 25 mmol/L (ref 22–32)
Calcium: 8.4 mg/dL — ABNORMAL LOW (ref 8.9–10.3)
Chloride: 97 mmol/L — ABNORMAL LOW (ref 98–111)
Creatinine, Ser: 3.72 mg/dL — ABNORMAL HIGH (ref 0.44–1.00)
GFR, Estimated: 13 mL/min — ABNORMAL LOW (ref >60)
Glucose, Bld: 100 mg/dL — ABNORMAL HIGH (ref 70–99)
Potassium: 3.3 mmol/L — ABNORMAL LOW (ref 3.5–5.1)
Sodium: 136 mmol/L (ref 135–145)

## 2021-10-02 LAB — CBC
HCT: 40.3 % (ref 36.0–46.0)
Hemoglobin: 12.4 g/dL (ref 12.0–15.0)
MCH: 32.5 pg (ref 26.0–34.0)
MCHC: 30.8 g/dL (ref 30.0–36.0)
MCV: 105.8 fL — ABNORMAL HIGH (ref 80.0–100.0)
Platelets: 143 10*3/uL — ABNORMAL LOW (ref 150–400)
RBC: 3.81 MIL/uL — ABNORMAL LOW (ref 3.87–5.11)
RDW: 16.3 % — ABNORMAL HIGH (ref 11.5–15.5)
WBC: 4.7 10*3/uL (ref 4.0–10.5)
nRBC: 0 % (ref 0.0–0.2)

## 2021-10-02 MED ORDER — TRAMADOL HCL 50 MG PO TABS
50.0000 mg | ORAL_TABLET | Freq: Once | ORAL | Status: AC
  Administered 2021-10-02: 50 mg via ORAL
  Filled 2021-10-02: qty 1

## 2021-10-02 MED ORDER — ACETAMINOPHEN 500 MG PO TABS
1000.0000 mg | ORAL_TABLET | Freq: Once | ORAL | Status: AC
  Administered 2021-10-02: 1000 mg via ORAL
  Filled 2021-10-02: qty 2

## 2021-10-02 MED ORDER — FAMOTIDINE 20 MG PO TABS
20.0000 mg | ORAL_TABLET | Freq: Once | ORAL | Status: AC
  Administered 2021-10-02: 20 mg via ORAL
  Filled 2021-10-02: qty 1

## 2021-10-02 MED ORDER — ALUM & MAG HYDROXIDE-SIMETH 200-200-20 MG/5ML PO SUSP
30.0000 mL | Freq: Once | ORAL | Status: AC
  Administered 2021-10-02: 30 mL via ORAL
  Filled 2021-10-02: qty 30

## 2021-10-02 NOTE — ED Provider Notes (Signed)
[facility] EMERGENCY DEPARTMENT Provider Note   CSN: [id number] Arrival date & time: 10/02/21  1015     History  Chief Complaint  Patient presents with   Chest Pain    Jodi Bell is a 61 y.o. female.  Patient with hx esrd/hd MWF, cad s/p cabg 2022, c/o chest pain onset last night at rest. Symptoms acute onset, at rest, left chest, mild-mod, waxing/waning, lasting hours, not pleuritic. No associated sob, nv or diaphoresis. No recent exertional cp or discomfort. Had admissions w chest pain earlier this summer - states plans to f/u with cardiology. Had normal hd today, but stopped a few minutes early due to CP. Denies cough or new/worsening sob. No fever or chills. Denies abd pain or nv. No extremity pain or swelling.   The history is provided by the patient, medical records and the EMS personnel.  Chest Pain Associated symptoms: no abdominal pain, no back pain, no cough, no fever, no headache, no nausea, no palpitations, no shortness of breath and no vomiting        Home Medications Prior to Admission medications   Medication Sig Start Date End Date Taking? Authorizing Provider  acetaminophen (TYLENOL) 500 MG tablet Take 1,000 mg by mouth daily as needed (pain).    [provider]  albuterol (PROVENTIL) (2.5 MG/3ML) 0.083% nebulizer solution Take 3 mLs (2.5 mg total) by nebulization every 6 (six) hours as needed for wheezing or shortness of breath. 07/10/21   Hunsucker, Bonna Gains, MD  allopurinol (ZYLOPRIM) 100 MG tablet Take 100 mg by mouth every evening.    [provider]  amLODipine (NORVASC) 10 MG tablet Take 1 tablet by mouth daily.    [provider]  calcium acetate (PHOSLO) 667 MG capsule Take 1,334 mg by mouth See admin instructions. Take 1,334 by mouth twice a day when able to remember. 05/13/21   [provider]  carboxymethylcellulose 1 % ophthalmic solution Place 2 drops into both eyes 2 (two) times daily as  needed (dry eyes).    [provider]  cetirizine (ZYRTEC) 10 MG tablet Take 10 mg by mouth daily as needed for allergies.    [provider]  cholecalciferol (VITAMIN D) 25 MCG tablet Take 1 tablet (1,000 Units total) by mouth every evening. 10/04/20   Gold, Wilder Glade, PA-C  cholecalciferol (VITAMIN D3) 10 MCG (400 UNIT) TABS tablet Take 800 Units by mouth daily.    [provider]  cinacalcet (SENSIPAR) 60 MG tablet Take 60 mg by mouth daily.    [provider]  cloNIDine (CATAPRES) 0.2 MG tablet Take 0.2 mg by mouth 3 (three) times daily. 08/08/21   [provider]  cyclobenzaprine (FLEXERIL) 10 MG tablet Take 10 mg by mouth 2 (two) times daily as needed for muscle spasms.    [provider]  Darbepoetin Alfa (ARANESP) 200 MCG/0.4ML SOSY injection Inject 0.4 mLs (200 mcg total) into the vein every Wednesday with hemodialysis. 10/10/20   Gold, Patrick Jupiter E, PA-C  Diclofenac Sodium (VOLTAREN EX) Apply 1 application. topically daily as needed (pain).    [provider]  docusate sodium (COLACE) 100 MG capsule Take 100-200 mg by mouth daily as needed for mild constipation.    [provider]  ferrous sulfate 325 (65 FE) MG tablet Take 325 mg by mouth See admin instructions. Take 325 mg by mouth three times a week    [provider]  folic acid (FOLVITE) 782 MCG tablet Take 400 mcg  by mouth daily.    [provider]  gabapentin (NEURONTIN) 300 MG capsule Take 1 capsule (300 mg total) by mouth at bedtime as needed (Neuropathic pain). Patient taking differently: Take 300 mg by mouth daily as needed (Neuropathic pain). 12/14/17 07/16/21  Dana Allan I, MD  heparin 1000 unit/mL SOLN injection 3 mLs (3,000 Units total) by Dialysis route as needed (in dialysis as needed). 10/04/20   Jadene Pierini E, PA-C  heparin 1000 unit/mL SOLN injection 2 mLs (2,000 Units total) by Dialysis route as needed (in dialysis as needed). 10/04/20    Jadene Pierini E, PA-C  heparin 1000 unit/mL SOLN injection 1 mL (1,000 Units total) by Dialysis route as needed (in dialysis). 10/04/20   Jadene Pierini E, PA-C  hydrALAZINE (APRESOLINE) 100 MG tablet Take 1 tablet (100 mg total) by mouth every 8 (eight) hours. Patient taking differently: Take 100 mg by mouth 3 (three) times daily. 07/06/20   Cresenzo, Angelyn Punt, MD  hydrocortisone (ANUSOL-HC) 2.5 % rectal cream Place rectally 2 (two) times daily. 07/23/21   [name], MD  isosorbide mononitrate (IMDUR) 60 MG 24 hr tablet Take 1 tablet (60 mg total) by mouth daily. 08/14/21   Bhagat, Crista Luria, PA  labetalol (NORMODYNE) 200 MG tablet Take 2 tablets (400 mg total) by mouth 3 (three) times daily. 07/11/21   Bhagat, Bhavinkumar, PA  lidocaine (LIDODERM) 5 % Place 1 patch onto the skin every 12 (twelve) hours as needed. Remove & Discard patch within 12 hours or as directed by MD 07/23/21   [name], MD  LORazepam (ATIVAN) 1 MG tablet Take 1 mg by mouth every 8 (eight) hours as needed for anxiety. 06/25/20   [provider]  montelukast (SINGULAIR) 10 MG tablet Take 10 mg by mouth daily with supper.     [provider]  olmesartan (BENICAR) 40 MG tablet Take 40 mg by mouth every evening. 05/06/19   [provider]  ondansetron (ZOFRAN) 4 MG tablet Take 4 mg by mouth 2 (two) times daily as needed for nausea or vomiting.    [provider]  OVER THE COUNTER MEDICATION Take by mouth daily as needed (sickness). Elderberry Fruit and flower 1 dropper full by mouth daily as needed for sickness    [provider]  Oxycodone HCl 10 MG TABS Take 10 mg by mouth daily as needed for pain. 01/11/21   [provider]  OXYGEN Inhale 2 L into the lungs as directed.    [provider]  pantoprazole (PROTONIX) 40 MG tablet Take 40 mg by mouth daily. 06/09/20   [provider]  polyethylene glycol powder (GLYCOLAX/MIRALAX) 17 GM/SCOOP powder Mix 17 grams into 8 ounces  of liquid and drink by mouth daily. Patient taking differently: Take 17 g by mouth daily as needed (constipation). 03/19/21   Carollee Leitz, MD  predniSONE (DELTASONE) 5 MG tablet Take 4 tablets (20 mg total) by mouth daily with breakfast for 2 days, THEN 1 tablet (5 mg total) daily with breakfast. Patient taking differently: Take 1 tablet (5 mg total) daily with breakfast. 06/29/21 12/28/21  Lacinda Axon, MD  Probiotic Product (PROBIOTIC PO) Take 1 capsule by mouth daily.    [provider]  sorbitol 70 % SOLN Take 30 ml 1-2 times daily as needed for constipation 07/23/21   [name], MD  Tiotropium Bromide Monohydrate (SPIRIVA RESPIMAT) 2.5 MCG/ACT AERS Inhale 2 puffs into the lungs 2 (two) times daily as needed (wheezing/SOB). 07/10/21   Hunsucker,  Bonna Gains, MD  VENTOLIN HFA 108 [phone] Base) MCG/ACT inhaler Inhale 2 puffs into the lungs See admin instructions. Take 2 puffs every 4-6 hours as needed for wheezing and shortness of breath 07/10/21   Hunsucker, Bonna Gains, MD  zolpidem (AMBIEN) 5 MG tablet Take 5 mg by mouth at bedtime as needed for sleep. 02/04/21   [provider]      Allergies    3-methyl-2-benzothiazolinone hydrazone, Banana, Black walnut flavor, Hazelnut (filbert) allergy skin test, Leflunomide and related, Lisinopril, No healthtouch food allergies, Other, Pecan extract allergy skin test, Pecan nut (diagnostic), Trazodone and nefazodone, Adalimumab, Escitalopram oxalate, Ezetimibe, Secukinumab, Statins, Ferrlecit [na ferric gluc cplx in sucrose], Gabapentin, and Ibuprofen    Review of Systems   Review of Systems  Constitutional:  Negative for chills and fever.  HENT:  Negative for sore throat.   Eyes:  Negative for redness.  Respiratory:  Negative for cough and shortness of breath.   Cardiovascular:  Positive for chest pain. Negative for palpitations and leg swelling.  Gastrointestinal:  Negative for abdominal pain, nausea and vomiting.  Genitourinary:   Negative for flank pain.  Musculoskeletal:  Negative for back pain and neck pain.  Skin:  Negative for rash.  Neurological:  Negative for headaches.  Hematological:  Does not bruise/bleed easily.  Psychiatric/Behavioral:  Negative for confusion.     Physical Exam Updated Vital Signs BP (!) 175/76 (BP Location: Right Arm)   Pulse 69   Temp 97.9 F (36.6 C) (Oral)   Resp 20   Ht 1.575 m (5\' 2" )   Wt 64 kg   BMI 25.81 kg/m  Physical Exam Vitals and nursing note reviewed.  Constitutional:      Appearance: Normal appearance. She is well-developed.  HENT:     Head: Atraumatic.     Nose: Nose normal.     Mouth/Throat:     Mouth: Mucous membranes are moist.  Eyes:     General: No scleral icterus.    Conjunctiva/sclera: Conjunctivae normal.  Neck:     Trachea: No tracheal deviation.  Cardiovascular:     Rate and Rhythm: Normal rate and regular rhythm.     Pulses: Normal pulses.     Heart sounds: Normal heart sounds. No murmur heard.    No friction rub. No gallop.  Pulmonary:     Effort: Pulmonary effort is normal. No respiratory distress.     Breath sounds: Normal breath sounds.     Comments: No sts or skin changes/lesions in area of pain.  Chest:     Chest wall: No tenderness.  Abdominal:     General: Bowel sounds are normal. There is no distension.     Palpations: Abdomen is soft.     Tenderness: There is no abdominal tenderness.  Genitourinary:    Comments: No cva tenderness.  Musculoskeletal:        General: No swelling or tenderness.     Cervical back: Normal range of motion and neck supple. No rigidity. No muscular tenderness.     Right lower leg: No edema.     Left lower leg: No edema.  Skin:    General: Skin is warm and dry.     Findings: No rash.  Neurological:     Mental Status: She is alert.     Comments: Alert, speech normal.   Psychiatric:        Mood and Affect: Mood normal.     ED Results / Procedures / Treatments   Labs (  all labs ordered are  listed, but only abnormal results are displayed) Results for orders placed or performed during the hospital encounter of 10/02/21  CBC  Result Value Ref Range   WBC 4.7 4.0 - 10.5 K/uL   RBC 3.81 (L) 3.87 - 5.11 MIL/uL   Hemoglobin 12.4 12.0 - 15.0 g/dL   HCT 40.3 36.0 - 46.0 %   MCV 105.8 (H) 80.0 - 100.0 fL   MCH 32.5 26.0 - 34.0 pg   MCHC 30.8 30.0 - 36.0 g/dL   RDW 16.3 (H) 11.5 - 15.5 %   Platelets 143 (L) 150 - 400 K/uL   nRBC 0.0 0.0 - 0.2 %  Basic metabolic panel  Result Value Ref Range   Sodium 136 135 - 145 mmol/L   Potassium 3.3 (L) 3.5 - 5.1 mmol/L   Chloride 97 (L) 98 - 111 mmol/L   CO2 25 22 - 32 mmol/L   Glucose, Bld 100 (H) 70 - 99 mg/dL   BUN 12 8 - 23 mg/dL   Creatinine, Ser 3.72 (H) 0.44 - 1.00 mg/dL   Calcium 8.4 (L) 8.9 - 10.3 mg/dL   GFR, Estimated 13 (L) >60 mL/min   Anion gap 14 5 - 15  Troponin I (High Sensitivity)  Result Value Ref Range   Troponin I (High Sensitivity) 23 (H) <18 ng/L  Troponin I (High Sensitivity)  Result Value Ref Range   Troponin I (High Sensitivity) 25 (H) <18 ng/L     EKG EKG Interpretation  Date/Time:  Wednesday October 02 2021 10:29:55 EDT Ventricular Rate:  71 PR Interval:  184 QRS Duration: 95 QT Interval:  442 QTC Calculation: 481 R Axis:   -28 Text Interpretation: Sinus rhythm Probable left atrial enlargement Left ventricular hypertrophy Confirmed by Lajean Saver [phone]) on 10/02/2021 10:33:13 AM  Radiology DG Chest Port 1 View  Result Date: 10/02/2021 CLINICAL DATA:  Chest pain and tightness since last night worsened this morning, shortness of breath EXAM: PORTABLE CHEST 1 VIEW COMPARISON:  Portable exam 1042 hours compared to 07/16/2021 FINDINGS: Enlargement of cardiac silhouette post CABG. Slight pulmonary vascular congestion. Atherosclerotic calcification aorta. Lungs clear. No definite acute pulmonary infiltrate/edema, pleural effusion, or pneumothorax. Bones demineralized. IMPRESSION: Enlargement of cardiac  silhouette with pulmonary vascular congestion post CABG. No acute abnormalities. Aortic Atherosclerosis (ICD10-I70.0). Electronically Signed   By: Lavonia Dana M.D.   On: 10/02/2021 10:47    Procedures Procedures    Medications Ordered in ED Medications - No data to display  ED Course/ Medical Decision Making/ A&P                           Medical Decision Making Problems Addressed: ESRD on dialysis [facility]): chronic illness or injury with exacerbation, progression, or side effects of treatment that poses a threat to life or bodily functions Essential hypertension: chronic illness or injury with exacerbation, progression, or side effects of treatment that poses a threat to life or bodily functions Precordial chest pain: acute illness or injury with systemic symptoms that poses a threat to life or bodily functions Thrombocytopenia ([place]): acute illness or injury  Amount and/or Complexity of Data Reviewed Independent Historian: EMS    Details: hx External Data Reviewed: radiology and notes. Labs: ordered. Decision-making details documented in ED Course. Radiology: ordered and independent interpretation performed. Decision-making details documented in ED Course. ECG/medicine tests: ordered and independent interpretation performed. Decision-making details documented in ED Course.  Risk OTC drugs. Prescription drug management.  Decision regarding hospitalization.   Iv ns. Continuous pulse ox and cardiac monitoring. Labs ordered/sent. Imaging ordered.   Diff dx incl acs, msk cp, pna, gi cp, etc -  disp decision including potential need for admission considered if markedly elev trop, stemi on ecg, high k, pna, etc - will get labs and imaging and reassess.   Reviewed nursing notes and prior charts for additional history. External reports reviewed. Additional history from: EMS.  Acetaminophen po, pepcid po, maalox po.   Cardiac monitor: sinus rhythm, rate 70.  Labs reviewed/interpreted by  me - trop 23, similar to prior. Delta pending.   Xrays reviewed/interpreted by me - no pna. (CM, also noted on imaging/ct past month).   Recheck pt, feels improved. Await delta trop.   Additional labs reviewed/interpreted by me- delta trop not significantly increased.   Pt comfortable, symptoms resolved, and currently appears stable for d/c. Current bp 160/74. Hr 66, pulse ox 100%, no increased wob.   Rec close pcp and card  f/u in the coming week.   Return precautions provided.           Final Clinical Impression(s) / ED Diagnoses Final diagnoses:  None    Rx / DC Orders ED Discharge Orders     None         Lajean Saver, MD 10/02/21 1547

## 2021-10-02 NOTE — Discharge Instructions (Addendum)
It was our pleasure to provide your ER care today - we hope that you feel better.  Your blood pressure is high  - continue your meds, limit salt intake, eat heart healthy eating plan, and follow up with primary care doctor this week.  For chest discomfort, follow up closely with cardiologist this coming week.   Return to ER right away if worse, new symptoms, fevers, recurrent or persistent chest pain, increased trouble breathing, or other concern.  You were given pain meds in the ER - no driving for the next 6 hours.

## 2021-10-02 NOTE — ED Triage Notes (Signed)
[facility] reports pt coming from dialysis. States she got all but about 20 min of her treatment. States pain started last night and is a cramping pain that radiates to her back. EMS gave 324mg  ASA and 2 Nitro with some relief.

## 2021-10-10 ENCOUNTER — Ambulatory Visit: Payer: Medicare PPO | Admitting: Physician Assistant

## 2021-10-10 ENCOUNTER — Telehealth: Payer: Self-pay | Admitting: Physician Assistant

## 2021-10-10 NOTE — Telephone Encounter (Signed)
Spoke with Vin who states if the patient is not feeling well change to virtual or reschedule to next week.   Contacted the patient offered virtual appointment today if she was up to it or to reschedule to next week.   The patient chose to reschedule to next week with Vin.   Appointment moved.

## 2021-10-10 NOTE — Telephone Encounter (Signed)
Pt said, she woke up feeling sick and would like to know if she can change her appt to virtual appt today with Vin

## 2021-10-15 ENCOUNTER — Ambulatory Visit: Payer: Medicare PPO | Attending: Physician Assistant | Admitting: Physician Assistant

## 2021-10-15 ENCOUNTER — Encounter: Payer: Self-pay | Admitting: Physician Assistant

## 2021-10-15 VITALS — BP 158/62 | HR 69 | Ht 62.0 in | Wt 138.0 lb

## 2021-10-15 DIAGNOSIS — I350 Nonrheumatic aortic (valve) stenosis: Secondary | ICD-10-CM | POA: Diagnosis not present

## 2021-10-15 DIAGNOSIS — Z951 Presence of aortocoronary bypass graft: Secondary | ICD-10-CM

## 2021-10-15 DIAGNOSIS — I10 Essential (primary) hypertension: Secondary | ICD-10-CM

## 2021-10-15 DIAGNOSIS — I25709 Atherosclerosis of coronary artery bypass graft(s), unspecified, with unspecified angina pectoris: Secondary | ICD-10-CM

## 2021-10-15 DIAGNOSIS — R079 Chest pain, unspecified: Secondary | ICD-10-CM

## 2021-10-15 MED ORDER — ASPIRIN 81 MG PO TBEC: 81.0000 mg | DELAYED_RELEASE_TABLET | Freq: Every day | ORAL | 3 refills | Status: DC

## 2021-10-15 NOTE — Progress Notes (Signed)
Cardiology Office Note:    Date:  10/15/2021   ID:  Jodi Bell, DOB [birth year], MRN [id number]  PCP:  [name]., MD  [facility] Cardiologist:  Candee Furbish, MD  [facility] Electrophysiologist:  None   Chief Complaint: 2 months follow up   History of Present Illness:    Jodi Bell is a 61 y.o. female with a hx of  of COPD/chronic hypoxic respiratory failure on 2-3 L oxygen, CAD s/p CABG x 2 09/2020, HTN, ESRD on HD (MWF), OSA (non compliance with CPAP), PAF (postoperatively), Aortic stenosis, mitral stenosis, and DM seen  for follow up.    Patient with history of postoperative atrial fibrillation.  Her amiodarone was discontinued when last seen by Dr. Marlou Porch December 2022.   Admitted 06/2021 for COPD exacerbation and volume overload.  Echo 06/26/21 with LVEF 70-75%, severe LVH, grade II DD, elevated LVEDP. Normal RV function and PASP. Severely dilated LA. Mild MR without stenosis. Moderate to severe aortic valve stenosis with Aortic valve mean gradient measures 41.3 mmHg.   Patient has chronic dyspnea on exertion, likely multifactorial.   Admitted 07/2021 for chest pain in setting of hypertensive urgency and volume overload. Improved after HD. Echo showed LVEF of 65-70% and grade II DD. RSVP 55.2 mm HG. Moderate AS with mean gradient of 36 mm Hg.   Here today for follow-up with daughter.  She continues to have intermittent chest pain.  Somewhat similar to prior to her CABG.  Her nephrologist discontinued amlodipine and started on Procardia due to fluctuating blood pressures with dialysis.  Breathing stable.  No lower extremity edema or melena.  Reports compliance with medication.  Past Medical History:  Diagnosis Date   Anemia of chronic disease    Anxiety    Arthritis    oa and psoriatic ra   Asthma    Back pain, chronic    lower back   Candida infection finished tx 2 days ago   Chronic insomnia    COPD (chronic obstructive pulmonary disease) (HCC)    Coronary  artery calcification seen on CT scan    Diabetes mellitus    type 2   Eczema    ESRD on dialysis [facility])    Essential hypertension    GERD (gastroesophageal reflux disease)    Gout    History of hiatal hernia    Hypoalbuminemia    Hyponatremia    Mild aortic stenosis    Mitral regurgitation    Mitral stenosis    Obesity    PAD (peripheral artery disease) ([place])    a. externial iliac calcification seen on CT 04/2020.   Pneumonia few yrs ago x 2   Sleep apnea    Tobacco abuse    Upper GI bleed 05/2019    Past Surgical History:  Procedure Laterality Date   BACK SURGERY  2016   lower back fusion with cage   BIOPSY  09/23/2017   Procedure: BIOPSY;  Surgeon: Wonda Horner, MD;  Location: [facility];  Service: Endoscopy;;   BIOPSY  05/19/2019   Procedure: BIOPSY;  Surgeon: Otis Brace, MD;  Location: [place];  Service: Gastroenterology;;   CESAREAN SECTION  1990   x 1    COLONOSCOPY WITH PROPOFOL N/A 09/23/2017   Procedure: COLONOSCOPY WITH PROPOFOL Hemostatic clips placed;  Surgeon: Wonda Horner, MD;  Location: [facility];  Service: Endoscopy;  Laterality: N/A;   COLONOSCOPY WITH PROPOFOL N/A 10/02/2020   Procedure: COLONOSCOPY WITH PROPOFOL;  Surgeon: Paulita Fujita,  Gwyndolyn Saxon, MD;  Location: [facility]COPY;  Service: Endoscopy;  Laterality: N/A;   CORONARY ARTERY BYPASS GRAFT N/A 09/13/2020   Procedure: CORONARY ARTERY BYPASS GRAFTING (CABG), ON PUMP, TIMES TWO, USING LEFT INTERNAL MAMMARY ARTERY AND RIGHT ENDOSCOPICALLY HARVESTED GREATER SAPHENOUS VEIN;  Surgeon: Gaye Pollack, MD;  Location: [place];  Service: Open Heart Surgery;  Laterality: N/A;   DILATION AND CURETTAGE OF UTERUS  1988   ENTEROSCOPY N/A 07/22/2021   Procedure: ENTEROSCOPY;  Surgeon: Ronnette Juniper, MD;  Location: [place];  Service: Gastroenterology;  Laterality: N/A;   ESOPHAGOGASTRODUODENOSCOPY N/A 09/22/2020   Procedure: ESOPHAGOGASTRODUODENOSCOPY (EGD);  Surgeon: Arta Silence, MD;  Location[facility];   Service: Endoscopy;  Laterality: N/A;   ESOPHAGOGASTRODUODENOSCOPY (EGD) WITH PROPOFOL N/A 09/23/2017   Procedure: ESOPHAGOGASTRODUODENOSCOPY (EGD) WITH PROPOFOL;  Surgeon: Wonda Horner, MD;  Location: [facility];  Service: Endoscopy;  Laterality: N/A;   ESOPHAGOGASTRODUODENOSCOPY (EGD) WITH PROPOFOL N/A 05/19/2019   Procedure: ESOPHAGOGASTRODUODENOSCOPY (EGD) WITH PROPOFOL;  Surgeon: Otis Brace, MD;  Location: [place];  Service: Gastroenterology;  Laterality: N/A;   ESOPHAGOGASTRODUODENOSCOPY (EGD) WITH PROPOFOL N/A 10/02/2020   Procedure: ESOPHAGOGASTRODUODENOSCOPY (EGD) WITH PROPOFOL;  Surgeon: Arta Silence, MD;  Location: [place];  Service: Endoscopy;  Laterality: N/A;   GIVENS CAPSULE STUDY N/A 05/19/2019   Procedure: GIVENS CAPSULE STUDY;  Surgeon: Otis Brace, MD;  Location: [place];  Service: Gastroenterology;  Laterality: N/A;   GIVENS CAPSULE STUDY N/A 07/19/2021   Procedure: GIVENS CAPSULE STUDY;  Surgeon: Clarene Essex, MD;  Location: [place];  Service: Gastroenterology;  Laterality: N/A;   HEMOSTASIS CLIP PLACEMENT  09/22/2020   Procedure: HEMOSTASIS CLIP PLACEMENT;  Surgeon: Arta Silence, MD;  Location: [place];  Service: Endoscopy;;   HEMOSTASIS CONTROL  09/22/2020   Procedure: HEMOSTASIS CONTROL;  Surgeon: Arta Silence, MD;  Location: [place];  Service: Endoscopy;;   HOT HEMOSTASIS N/A 05/19/2019   Procedure: HOT HEMOSTASIS (ARGON PLASMA COAGULATION/BICAP);  Surgeon: Otis Brace, MD;  Location[facility] ENDOSCOPY;  Service: Gastroenterology;  Laterality: N/A;   LEFT HEART CATH AND CORONARY ANGIOGRAPHY N/A 09/10/2020   Procedure: LEFT HEART CATH AND CORONARY ANGIOGRAPHY;  Surgeon: Nelva Bush, MD;  Location: [place] CV LAB;  Service: Cardiovascular;  Laterality: N/A;   PARTIAL KNEE ARTHROPLASTY Left 11/12/2017   Procedure: left unicompartmental arthroplasty-medial;  Surgeon: Paralee Cancel, MD;  Location: [facility];  Service: Orthopedics;   Laterality: Left;  74min   SUBMUCOSAL INJECTION  09/23/2017   Procedure: SUBMUCOSAL INJECTION;  Surgeon: Wonda Horner, MD;  Location: [facility];  Service: Endoscopy;;  in colon   TEE WITHOUT CARDIOVERSION N/A 09/13/2020   Procedure: TRANSESOPHAGEAL ECHOCARDIOGRAM (TEE);  Surgeon: Gaye Pollack, MD;  Location: [place];  Service: Open Heart Surgery;  Laterality: N/A;   TUBAL LIGATION  1990    Current Medications: Current Meds  Medication Sig   aspirin EC 81 MG tablet Take 1 tablet (81 mg total) by mouth daily. Swallow whole.     Allergies:   3-methyl-2-benzothiazolinone hydrazone, Banana, Black walnut flavor, Hazelnut (filbert) allergy skin test, Leflunomide and related, Lisinopril, No healthtouch food allergies, Other, Pecan extract allergy skin test, Pecan nut (diagnostic), Trazodone and nefazodone, Adalimumab, Escitalopram oxalate, Ezetimibe, Secukinumab, Statins, Ferrlecit [na ferric gluc cplx in sucrose], Gabapentin, and Ibuprofen   Social History   Socioeconomic History   Marital status: Divorced    Spouse name: BASIL   Number of children: 3   Years of education: 1   Highest education level: GED or equivalent  Occupational History  Occupation: retired Corporate treasurer  Tobacco Use   Smoking status: Former    Packs/day: 0.50    Years: 40.00    Total pack years: 20.00    Types: Cigarettes    Quit date: 08/2020    Years since quitting: 1.1   Smokeless tobacco: Never  Vaping Use   Vaping Use: Never used  Substance and Sexual Activity   Alcohol use: Yes    Comment: occ   Drug use: No   Sexual activity: Not on file  Other Topics Concern   Not on file  Social History Narrative   Not on file   Social Determinants of Health   Financial Resource Strain: Low Risk  (09/15/2018)   Overall Financial Resource Strain (CARDIA)    Difficulty of Paying Living Expenses: Not hard at all  Food Insecurity: No Food Insecurity (09/15/2018)   Hunger Vital Sign    Worried About Running Out of Food  in the Last Year: Never true    [place] in the Last Year: Never true  Transportation Needs: No Transportation Needs (09/15/2018)   PRAPARE - Hydrologist (Medical): No    Lack of Transportation (Non-Medical): No  Physical Activity: Inactive (09/15/2018)   Exercise Vital Sign    Days of Exercise per Week: 0 days    Minutes of Exercise per Session: 0 min  Stress: No Stress Concern Present (09/15/2018)   [place]    Feeling of Stress : Not at all  Social Connections: Moderately Isolated (09/15/2018)   Social Connection and Isolation Panel [NHANES]    Frequency of Communication with Friends and Family: More than three times a week    Frequency of Social Gatherings with Friends and Family: Three times a week    Attends Religious Services: More than 4 times per year    Active Member of Clubs or Organizations: No    Attends Archivist Meetings: Never    Marital Status: Divorced     Family History: The patient's family history includes Arthritis in her daughter and daughter; Asthma in her daughter; Diabetes in her sister; Heart disease in her sister and another family member; Lung cancer in her mother; Thyroid cancer in an other family member.   ROS:   Please see the history of present illness.    All other systems reviewed and are negative.  EKGs/Labs/Other Studies Reviewed:    The following studies were reviewed today:  Echo 07/2021 IMPRESSIONS     1. Left ventricular ejection fraction, by estimation, is 65 to 70%. The  left ventricle has normal function. The left ventricle has no regional  wall motion abnormalities. There is severe concentric left ventricular  hypertrophy. Left ventricular diastolic   parameters are consistent with Grade II diastolic dysfunction  (pseudonormalization). Elevated left atrial pressure.   2. Right ventricular systolic function is mildly reduced.  The right  ventricular size is normal. There is moderately elevated pulmonary artery  systolic pressure. The estimated right ventricular systolic pressure is  76.8 mmHg which is higher than previous   TTE on 06/26/21 at 53mmHg. May be related to volume overload, however, PE  is on the differential. Recommend clinical correlation.   3. Left atrial size was severely dilated.   4. Right atrial size was mildly dilated.   5. The mitral valve is degenerative. Mild mitral valve regurgitation.  Severe mitral annular calcification.   6. Tricuspid valve regurgitation is mild  to moderate.   7. The aortic valve is tricuspid. There is moderate calcification of the  aortic valve. There is moderate thickening of the aortic valve. Aortic  valve regurgitation is not visualized. Moderate aortic valve stenosis.  Aortic valve mean gradient measures  36.0 mmHg. Aortic valve Vmax measures 4.11 m/s. AVA by continuity 1.4cm2,  DI 0.20m/s. Suspect elevated mean gradient and Vmax in the setting of high  output with LVOT VTI 40.4cm.   8. Aortic dilatation noted. There is borderline dilatation of the  ascending aorta, measuring 39 mm.   9. The inferior vena cava is dilated in size with <50% respiratory  variability, suggesting right atrial pressure of 15 mmHg.   Comparison(s): Compared to prior TTE on 06/26/21, the PASP is now 55.22mmHg  from 34.77mmHg and the RV is mildly hypokinetic. Elevated PASP may be  related to volume overload, however, PE is on the differential. Otherwise,  there continues to be moderate AS  with similar gradients.   EKG:  EKG is not  ordered today.   Recent Labs: 01/02/2021: B Natriuretic Peptide >4,500.0 06/26/2021: ALT 12 07/16/2021: Magnesium 1.7 10/02/2021: BUN 12; Creatinine, Ser 3.72; Hemoglobin 12.4; Platelets 143; Potassium 3.3; Sodium 136  Recent Lipid Panel    Component Value Date/Time   CHOL 131 07/16/2021 1316   TRIG 34 07/16/2021 1316   HDL 55 07/16/2021 1316   CHOLHDL 2.4  07/16/2021 1316   VLDL 7 07/16/2021 1316   LDLCALC 69 07/16/2021 1316    Physical Exam:    VS:  BP (!) 158/62   Pulse 69   Ht 5\' 2"  (1.575 m)   Wt 138 lb (62.6 kg)   SpO2 97%   BMI 25.24 kg/m     Wt Readings from Last 3 Encounters:  10/15/21 138 lb (62.6 kg)  10/02/21 141 lb 1.5 oz (64 kg)  09/12/21 141 lb 6.4 oz (64.1 kg)     GEN:  Well nourished, well developed in no acute distress HEENT: Normal NECK: No JVD; No carotid bruits LYMPHATICS: No lymphadenopathy CARDIAC: RRR, 3/6 murmurs, rubs, gallops RESPIRATORY:  Clear to auscultation without rales, wheezing or rhonchi  ABDOMEN: Soft, non-tender, non-distended MUSCULOSKELETAL:  No edema; No deformity  SKIN: Warm and dry NEUROLOGIC:  Alert and oriented x 3 PSYCHIATRIC:  Normal affect   ASSESSMENT AND PLAN:    Chest pain with history of CABG Somewhat similar to prior angina.  Patient reports he is not taking aspirin 81 mg for long time.  Unable to provide any explanation.  She had a EGD and endoscopy done 07/2021 without specific finding.  At that time she had intermittent blood in her stool.  Will restart aspirin 81 mg daily but she will get approval from GI first.  Continue PPI.  Continue Imdur and beta-blocker.  Shared Decision Making/Informed Consent The risks [chest pain, shortness of breath, cardiac arrhythmias, dizziness, blood pressure fluctuations, myocardial infarction, stroke/transient ischemic attack, nausea, vomiting, allergic reaction, radiation exposure, metallic taste sensation and life-threatening complications (estimated to be 1 in 10,000)], benefits (risk stratification, diagnosing coronary artery disease, treatment guidance) and alternatives of a nuclear stress test were discussed in detail with Ms. Lovena Le and she agrees to proceed.   2.  Aortic stenosis In moderate range.  Follow-up with routine echo.  3. Uncontrolled HTN - Her nephrologist discontinued amlodipine and started on Procardia due to  fluctuating blood pressures with dialysis.  - Will be managed by nephrologist - Refer to HTN clinic    Medication Adjustments/Labs and  Tests Ordered: Current medicines are reviewed at length with the patient today.  Concerns regarding medicines are outlined above.  Orders Placed This Encounter  Procedures   MYOCARDIAL PERFUSION IMAGING   Meds ordered this encounter  Medications   aspirin EC 81 MG tablet    Sig: Take 1 tablet (81 mg total) by mouth daily. Swallow whole.    Dispense:  90 tablet    Refill:  3    Patient Instructions  Medication Instructions:  START Aspirin 81mg  Take 1 tablet once a day *If you need a refill on your cardiac medications before your next appointment, please call your pharmacy*   Lab Work: None Ordered   Testing/Procedures: Your physician has requested that you have a lexiscan myoview. For further information please visit [email or web address]. Please follow instruction sheet, as given.   Follow-Up: At [facility], you and your health needs are our priority.  As part of our continuing mission to provide you with exceptional heart care, we have created designated Provider Care Teams.  These Care Teams include your primary Cardiologist (physician) and Advanced Practice Providers (APPs -  Physician Assistants and Nurse Practitioners) who all work together to provide you with the care you need, when you need it.  We recommend signing up for the patient portal called "MyChart".  Sign up information is provided on this After Visit Summary.  MyChart is used to connect with patients for Virtual Visits (Telemedicine).  Patients are able to view lab/test results, encounter notes, upcoming appointments, etc.  Non-urgent messages can be sent to your provider as well.   To learn more about what you can do with MyChart, go to [email or web address].    Your next appointment:   4 month(s)  The format for your next appointment:   In Person  Provider:    Candee Furbish, MD      Needs appointment in hypertension clinic with Dr Oval Linsey Other Instructions   Important Information About Sugar         Signed, Leanor Kail, PA  10/15/2021 2:35 PM    [place]

## 2021-10-15 NOTE — Patient Instructions (Addendum)
Medication Instructions:  START Aspirin 81mg  Take 1 tablet once a day *If you need a refill on your cardiac medications before your next appointment, please call your pharmacy*   Lab Work: None Ordered   Testing/Procedures: Your physician has requested that you have a lexiscan myoview. For further information please visit [email or web address]. Please follow instruction sheet, as given.   Follow-Up: At [facility], you and your health needs are our priority.  As part of our continuing mission to provide you with exceptional heart care, we have created designated Provider Care Teams.  These Care Teams include your primary Cardiologist (physician) and Advanced Practice Providers (APPs -  Physician Assistants and Nurse Practitioners) who all work together to provide you with the care you need, when you need it.  We recommend signing up for the patient portal called "MyChart".  Sign up information is provided on this After Visit Summary.  MyChart is used to connect with patients for Virtual Visits (Telemedicine).  Patients are able to view lab/test results, encounter notes, upcoming appointments, etc.  Non-urgent messages can be sent to your provider as well.   To learn more about what you can do with MyChart, go to [email or web address].    Your next appointment:   4 month(s)  The format for your next appointment:   In Person  Provider:   Candee Furbish, MD      Needs appointment in hypertension clinic with Dr Oval Linsey Other Instructions   Important Information About Sugar

## 2021-10-20 ENCOUNTER — Inpatient Hospital Stay (HOSPITAL_BASED_OUTPATIENT_CLINIC_OR_DEPARTMENT_OTHER)
Admission: EM | Admit: 2021-10-20 | Discharge: 2021-10-25 | DRG: 193 | Disposition: A | Discharge status: Home / Self Care | Payer: Medicare PPO | Attending: Student | Admitting: Student

## 2021-10-20 ENCOUNTER — Other Ambulatory Visit: Payer: Self-pay

## 2021-10-20 ENCOUNTER — Emergency Department (HOSPITAL_BASED_OUTPATIENT_CLINIC_OR_DEPARTMENT_OTHER): Payer: Medicare PPO

## 2021-10-20 ENCOUNTER — Encounter (HOSPITAL_BASED_OUTPATIENT_CLINIC_OR_DEPARTMENT_OTHER): Payer: Self-pay | Admitting: Emergency Medicine

## 2021-10-20 DIAGNOSIS — Z79899 Other long term (current) drug therapy: Secondary | ICD-10-CM | POA: Diagnosis not present

## 2021-10-20 DIAGNOSIS — N186 End stage renal disease: Secondary | ICD-10-CM | POA: Diagnosis present

## 2021-10-20 DIAGNOSIS — I5033 Acute on chronic diastolic (congestive) heart failure: Secondary | ICD-10-CM | POA: Diagnosis present

## 2021-10-20 DIAGNOSIS — Z801 Family history of malignant neoplasm of trachea, bronchus and lung: Secondary | ICD-10-CM

## 2021-10-20 DIAGNOSIS — K219 Gastro-esophageal reflux disease without esophagitis: Secondary | ICD-10-CM | POA: Diagnosis present

## 2021-10-20 DIAGNOSIS — Z833 Family history of diabetes mellitus: Secondary | ICD-10-CM

## 2021-10-20 DIAGNOSIS — Z981 Arthrodesis status: Secondary | ICD-10-CM | POA: Diagnosis not present

## 2021-10-20 DIAGNOSIS — Z808 Family history of malignant neoplasm of other organs or systems: Secondary | ICD-10-CM

## 2021-10-20 DIAGNOSIS — I480 Paroxysmal atrial fibrillation: Secondary | ICD-10-CM | POA: Diagnosis present

## 2021-10-20 DIAGNOSIS — G4733 Obstructive sleep apnea (adult) (pediatric): Secondary | ICD-10-CM | POA: Diagnosis present

## 2021-10-20 DIAGNOSIS — I10 Essential (primary) hypertension: Secondary | ICD-10-CM | POA: Diagnosis present

## 2021-10-20 DIAGNOSIS — Z91199 Patient's noncompliance with other medical treatment and regimen due to unspecified reason: Secondary | ICD-10-CM

## 2021-10-20 DIAGNOSIS — I2510 Atherosclerotic heart disease of native coronary artery without angina pectoris: Secondary | ICD-10-CM | POA: Diagnosis present

## 2021-10-20 DIAGNOSIS — Z20822 Contact with and (suspected) exposure to covid-19: Secondary | ICD-10-CM | POA: Diagnosis present

## 2021-10-20 DIAGNOSIS — E119 Type 2 diabetes mellitus without complications: Secondary | ICD-10-CM

## 2021-10-20 DIAGNOSIS — I132 Hypertensive heart and chronic kidney disease with heart failure and with stage 5 chronic kidney disease, or end stage renal disease: Secondary | ICD-10-CM | POA: Diagnosis present

## 2021-10-20 DIAGNOSIS — R531 Weakness: Secondary | ICD-10-CM

## 2021-10-20 DIAGNOSIS — J189 Pneumonia, unspecified organism: Secondary | ICD-10-CM | POA: Diagnosis present

## 2021-10-20 DIAGNOSIS — E8779 Other fluid overload: Secondary | ICD-10-CM | POA: Diagnosis not present

## 2021-10-20 DIAGNOSIS — Z7952 Long term (current) use of systemic steroids: Secondary | ICD-10-CM

## 2021-10-20 DIAGNOSIS — Z8249 Family history of ischemic heart disease and other diseases of the circulatory system: Secondary | ICD-10-CM

## 2021-10-20 DIAGNOSIS — Z886 Allergy status to analgesic agent status: Secondary | ICD-10-CM

## 2021-10-20 DIAGNOSIS — J45909 Unspecified asthma, uncomplicated: Secondary | ICD-10-CM | POA: Diagnosis present

## 2021-10-20 DIAGNOSIS — Z951 Presence of aortocoronary bypass graft: Secondary | ICD-10-CM

## 2021-10-20 DIAGNOSIS — Z992 Dependence on renal dialysis: Secondary | ICD-10-CM | POA: Diagnosis not present

## 2021-10-20 DIAGNOSIS — J9621 Acute and chronic respiratory failure with hypoxia: Secondary | ICD-10-CM | POA: Diagnosis not present

## 2021-10-20 DIAGNOSIS — Z7982 Long term (current) use of aspirin: Secondary | ICD-10-CM

## 2021-10-20 DIAGNOSIS — Z87891 Personal history of nicotine dependence: Secondary | ICD-10-CM

## 2021-10-20 DIAGNOSIS — D649 Anemia, unspecified: Secondary | ICD-10-CM | POA: Diagnosis not present

## 2021-10-20 DIAGNOSIS — R0902 Hypoxemia: Principal | ICD-10-CM

## 2021-10-20 DIAGNOSIS — R0602 Shortness of breath: Secondary | ICD-10-CM | POA: Diagnosis present

## 2021-10-20 DIAGNOSIS — D539 Nutritional anemia, unspecified: Secondary | ICD-10-CM | POA: Diagnosis present

## 2021-10-20 DIAGNOSIS — E8770 Fluid overload, unspecified: Secondary | ICD-10-CM | POA: Diagnosis present

## 2021-10-20 DIAGNOSIS — N2581 Secondary hyperparathyroidism of renal origin: Secondary | ICD-10-CM | POA: Diagnosis present

## 2021-10-20 DIAGNOSIS — I25709 Atherosclerosis of coronary artery bypass graft(s), unspecified, with unspecified angina pectoris: Secondary | ICD-10-CM | POA: Diagnosis present

## 2021-10-20 DIAGNOSIS — E1122 Type 2 diabetes mellitus with diabetic chronic kidney disease: Secondary | ICD-10-CM | POA: Diagnosis present

## 2021-10-20 DIAGNOSIS — Z96652 Presence of left artificial knee joint: Secondary | ICD-10-CM | POA: Diagnosis present

## 2021-10-20 DIAGNOSIS — Z825 Family history of asthma and other chronic lower respiratory diseases: Secondary | ICD-10-CM

## 2021-10-20 DIAGNOSIS — I5032 Chronic diastolic (congestive) heart failure: Secondary | ICD-10-CM | POA: Diagnosis not present

## 2021-10-20 DIAGNOSIS — J440 Chronic obstructive pulmonary disease with acute lower respiratory infection: Secondary | ICD-10-CM | POA: Diagnosis present

## 2021-10-20 DIAGNOSIS — E871 Hypo-osmolality and hyponatremia: Secondary | ICD-10-CM | POA: Diagnosis present

## 2021-10-20 DIAGNOSIS — D631 Anemia in chronic kidney disease: Secondary | ICD-10-CM | POA: Diagnosis present

## 2021-10-20 DIAGNOSIS — E1151 Type 2 diabetes mellitus with diabetic peripheral angiopathy without gangrene: Secondary | ICD-10-CM | POA: Diagnosis present

## 2021-10-20 DIAGNOSIS — Z9981 Dependence on supplemental oxygen: Secondary | ICD-10-CM

## 2021-10-20 DIAGNOSIS — Z91018 Allergy to other foods: Secondary | ICD-10-CM

## 2021-10-20 DIAGNOSIS — J441 Chronic obstructive pulmonary disease with (acute) exacerbation: Secondary | ICD-10-CM | POA: Diagnosis present

## 2021-10-20 DIAGNOSIS — I4891 Unspecified atrial fibrillation: Secondary | ICD-10-CM | POA: Diagnosis present

## 2021-10-20 DIAGNOSIS — Z888 Allergy status to other drugs, medicaments and biological substances status: Secondary | ICD-10-CM

## 2021-10-20 LAB — I-STAT VENOUS BLOOD GAS, ED
Acid-Base Excess: 4 mmol/L — ABNORMAL HIGH (ref 0.0–2.0)
Bicarbonate: 27.5 mmol/L (ref 20.0–28.0)
Calcium, Ion: 1 mmol/L — ABNORMAL LOW (ref 1.15–1.40)
HCT: 29 % — ABNORMAL LOW (ref 36.0–46.0)
Hemoglobin: 9.9 g/dL — ABNORMAL LOW (ref 12.0–15.0)
O2 Saturation: 99 %
Potassium: 4 mmol/L (ref 3.5–5.1)
Sodium: 132 mmol/L — ABNORMAL LOW (ref 135–145)
TCO2: 29 mmol/L (ref 22–32)
pCO2, Ven: 36.5 mmHg — ABNORMAL LOW (ref 44–60)
pH, Ven: 7.485 — ABNORMAL HIGH (ref 7.25–7.43)
pO2, Ven: 141 mmHg — ABNORMAL HIGH (ref 32–45)

## 2021-10-20 LAB — CBC WITH DIFFERENTIAL/PLATELET
Abs Immature Granulocytes: 0.03 10*3/uL (ref 0.00–0.07)
Basophils Absolute: 0 10*3/uL (ref 0.0–0.1)
Basophils Relative: 0 %
Eosinophils Absolute: 0.9 10*3/uL — ABNORMAL HIGH (ref 0.0–0.5)
Eosinophils Relative: 16 %
HCT: 28.4 % — ABNORMAL LOW (ref 36.0–46.0)
Hemoglobin: 9.2 g/dL — ABNORMAL LOW (ref 12.0–15.0)
Immature Granulocytes: 1 %
Lymphocytes Relative: 18 %
Lymphs Abs: 1.1 10*3/uL (ref 0.7–4.0)
MCH: 33.9 pg (ref 26.0–34.0)
MCHC: 32.4 g/dL (ref 30.0–36.0)
MCV: 104.8 fL — ABNORMAL HIGH (ref 80.0–100.0)
Monocytes Absolute: 0.6 10*3/uL (ref 0.1–1.0)
Monocytes Relative: 10 %
Neutro Abs: 3.2 10*3/uL (ref 1.7–7.7)
Neutrophils Relative %: 55 %
Platelets: 228 10*3/uL (ref 150–400)
RBC: 2.71 MIL/uL — ABNORMAL LOW (ref 3.87–5.11)
RDW: 14.4 % (ref 11.5–15.5)
WBC: 5.8 10*3/uL (ref 4.0–10.5)
nRBC: 0 % (ref 0.0–0.2)

## 2021-10-20 LAB — COMPREHENSIVE METABOLIC PANEL
ALT: 11 U/L (ref 0–44)
AST: 17 U/L (ref 15–41)
Albumin: 3.3 g/dL — ABNORMAL LOW (ref 3.5–5.0)
Alkaline Phosphatase: 260 U/L — ABNORMAL HIGH (ref 38–126)
Anion gap: 11 (ref 5–15)
BUN: 22 mg/dL (ref 8–23)
CO2: 27 mmol/L (ref 22–32)
Calcium: 8.7 mg/dL — ABNORMAL LOW (ref 8.9–10.3)
Chloride: 95 mmol/L — ABNORMAL LOW (ref 98–111)
Creatinine, Ser: 6.57 mg/dL — ABNORMAL HIGH (ref 0.44–1.00)
GFR, Estimated: 7 mL/min — ABNORMAL LOW (ref >60)
Glucose, Bld: 105 mg/dL — ABNORMAL HIGH (ref 70–99)
Potassium: 3.6 mmol/L (ref 3.5–5.1)
Sodium: 133 mmol/L — ABNORMAL LOW (ref 135–145)
Total Bilirubin: 0.7 mg/dL (ref 0.3–1.2)
Total Protein: 7 g/dL (ref 6.5–8.1)

## 2021-10-20 LAB — RESP PANEL BY RT-PCR (FLU A&B, COVID) ARPGX2
Influenza A by PCR: NEGATIVE
Influenza B by PCR: NEGATIVE
SARS Coronavirus 2 by RT PCR: NEGATIVE

## 2021-10-20 LAB — PROTIME-INR
INR: 1.2 (ref 0.8–1.2)
Prothrombin Time: 15 seconds (ref 11.4–15.2)

## 2021-10-20 LAB — BRAIN NATRIURETIC PEPTIDE: B Natriuretic Peptide: >4500 pg/mL — ABNORMAL HIGH (ref 0.0–100.0)

## 2021-10-20 MED ORDER — PANTOPRAZOLE SODIUM 40 MG PO TBEC
40.0000 mg | DELAYED_RELEASE_TABLET | Freq: Every day | ORAL | Status: DC
  Administered 2021-10-21 – 2021-10-24 (×4): 40 mg via ORAL
  Filled 2021-10-20 (×4): qty 1

## 2021-10-20 MED ORDER — ACETAMINOPHEN 650 MG RE SUPP: 650.0000 mg | Freq: Four times a day (QID) | RECTAL | Status: DC | PRN

## 2021-10-20 MED ORDER — ISOSORBIDE MONONITRATE ER 60 MG PO TB24
60.0000 mg | ORAL_TABLET | Freq: Every day | ORAL | Status: DC
  Administered 2021-10-21 – 2021-10-24 (×4): 60 mg via ORAL
  Filled 2021-10-20 (×4): qty 1

## 2021-10-20 MED ORDER — BENZONATATE 100 MG PO CAPS
200.0000 mg | ORAL_CAPSULE | Freq: Once | ORAL | Status: AC
  Administered 2021-10-20: 200 mg via ORAL
  Filled 2021-10-20: qty 2

## 2021-10-20 MED ORDER — INSULIN ASPART 100 UNIT/ML IJ SOLN
0.0000 [IU] | Freq: Three times a day (TID) | INTRAMUSCULAR | Status: DC
  Administered 2021-10-21: 1 [IU] via SUBCUTANEOUS
  Administered 2021-10-24: 3 [IU] via SUBCUTANEOUS

## 2021-10-20 MED ORDER — ACETAMINOPHEN 325 MG PO TABS
650.0000 mg | ORAL_TABLET | Freq: Four times a day (QID) | ORAL | Status: DC | PRN
  Administered 2021-10-21 – 2021-10-24 (×3): 650 mg via ORAL
  Filled 2021-10-20 (×3): qty 2

## 2021-10-20 MED ORDER — HYDRALAZINE HCL 50 MG PO TABS
100.0000 mg | ORAL_TABLET | Freq: Three times a day (TID) | ORAL | Status: DC
  Administered 2021-10-21 – 2021-10-25 (×13): 100 mg via ORAL
  Filled 2021-10-20 (×13): qty 2

## 2021-10-20 MED ORDER — ZOLPIDEM TARTRATE 5 MG PO TABS
5.0000 mg | ORAL_TABLET | Freq: Every evening | ORAL | Status: DC | PRN
Start: 2021-10-20 — End: 2021-10-25
  Administered 2021-10-21 – 2021-10-25 (×5): 5 mg via ORAL
  Filled 2021-10-20 (×5): qty 1

## 2021-10-20 MED ORDER — PREDNISONE 5 MG PO TABS
5.0000 mg | ORAL_TABLET | Freq: Every day | ORAL | Status: DC
  Administered 2021-10-21 – 2021-10-24 (×4): 5 mg via ORAL
  Filled 2021-10-20 (×4): qty 1

## 2021-10-20 MED ORDER — TIOTROPIUM BROMIDE MONOHYDRATE 2.5 MCG/ACT IN AERS: 2.0000 | INHALATION_SPRAY | Freq: Two times a day (BID) | RESPIRATORY_TRACT | Status: DC | PRN

## 2021-10-20 MED ORDER — SODIUM CHLORIDE 0.9 % IV SOLN
500.0000 mg | INTRAVENOUS | Status: DC
  Administered 2021-10-20 – 2021-10-21 (×2): 500 mg via INTRAVENOUS
  Filled 2021-10-20 (×3): qty 5

## 2021-10-20 MED ORDER — CHLORHEXIDINE GLUCONATE CLOTH 2 % EX PADS
6.0000 | MEDICATED_PAD | Freq: Every day | CUTANEOUS | Status: DC
  Administered 2021-10-21: 6 via TOPICAL
  Filled 2021-10-20: qty 6

## 2021-10-20 MED ORDER — NIFEDIPINE ER OSMOTIC RELEASE 60 MG PO TB24
60.0000 mg | ORAL_TABLET | Freq: Two times a day (BID) | ORAL | Status: DC
  Administered 2021-10-21 – 2021-10-24 (×9): 60 mg via ORAL
  Filled 2021-10-20 (×10): qty 1

## 2021-10-20 MED ORDER — ASPIRIN 81 MG PO TBEC
81.0000 mg | DELAYED_RELEASE_TABLET | Freq: Every day | ORAL | Status: DC
  Administered 2021-10-23 – 2021-10-24 (×2): 81 mg via ORAL
  Filled 2021-10-20 (×3): qty 1

## 2021-10-20 MED ORDER — IRBESARTAN 300 MG PO TABS
300.0000 mg | ORAL_TABLET | Freq: Every day | ORAL | Status: DC
  Administered 2021-10-22 – 2021-10-24 (×3): 300 mg via ORAL
  Filled 2021-10-20 (×3): qty 1

## 2021-10-20 MED ORDER — CLONIDINE HCL 0.3 MG PO TABS
0.3000 mg | ORAL_TABLET | Freq: Three times a day (TID) | ORAL | Status: DC
  Administered 2021-10-21 – 2021-10-25 (×13): 0.3 mg via ORAL
  Filled 2021-10-20 (×13): qty 1

## 2021-10-20 MED ORDER — IPRATROPIUM-ALBUTEROL 0.5-2.5 (3) MG/3ML IN SOLN
3.0000 mL | Freq: Once | RESPIRATORY_TRACT | Status: AC
  Administered 2021-10-20: 3 mL via RESPIRATORY_TRACT
  Filled 2021-10-20: qty 3

## 2021-10-20 MED ORDER — SODIUM CHLORIDE 0.9 % IV SOLN
1.0000 g | INTRAVENOUS | Status: DC
  Administered 2021-10-20 – 2021-10-24 (×5): 1 g via INTRAVENOUS
  Filled 2021-10-20 (×5): qty 10

## 2021-10-20 MED ORDER — ALBUTEROL SULFATE (2.5 MG/3ML) 0.083% IN NEBU: 2.5000 mg | INHALATION_SOLUTION | Freq: Four times a day (QID) | RESPIRATORY_TRACT | Status: DC | PRN

## 2021-10-20 MED ORDER — UMECLIDINIUM BROMIDE 62.5 MCG/ACT IN AEPB
1.0000 | INHALATION_SPRAY | Freq: Every day | RESPIRATORY_TRACT | Status: DC
  Administered 2021-10-21 – 2021-10-25 (×5): 1 via RESPIRATORY_TRACT
  Filled 2021-10-20: qty 7

## 2021-10-20 MED ORDER — HEPARIN SODIUM (PORCINE) 5000 UNIT/ML IJ SOLN
5000.0000 [IU] | Freq: Three times a day (TID) | INTRAMUSCULAR | Status: DC
Start: 2021-10-20 — End: 2021-10-25
  Administered 2021-10-20 – 2021-10-24 (×5): 5000 [IU] via SUBCUTANEOUS
  Filled 2021-10-20 (×9): qty 1

## 2021-10-20 MED ORDER — MONTELUKAST SODIUM 10 MG PO TABS
10.0000 mg | ORAL_TABLET | Freq: Every day | ORAL | Status: DC
  Administered 2021-10-21 – 2021-10-24 (×4): 10 mg via ORAL
  Filled 2021-10-20 (×4): qty 1

## 2021-10-20 MED ORDER — LORAZEPAM 1 MG PO TABS
1.0000 mg | ORAL_TABLET | Freq: Three times a day (TID) | ORAL | Status: DC | PRN
Start: 2021-10-20 — End: 2021-10-25
  Administered 2021-10-21 – 2021-10-24 (×6): 1 mg via ORAL
  Filled 2021-10-20 (×6): qty 1

## 2021-10-20 MED ORDER — POLYETHYLENE GLYCOL 3350 17 G PO PACK: 17.0000 g | PACK | Freq: Every day | ORAL | Status: DC | PRN

## 2021-10-20 MED ORDER — IOHEXOL 350 MG/ML SOLN
75.0000 mL | Freq: Once | INTRAVENOUS | Status: AC | PRN
  Administered 2021-10-20: 75 mL via INTRAVENOUS

## 2021-10-20 MED ORDER — METHYLPREDNISOLONE SODIUM SUCC 125 MG IJ SOLR
125.0000 mg | Freq: Once | INTRAMUSCULAR | Status: AC
  Administered 2021-10-20: 125 mg via INTRAVENOUS
  Filled 2021-10-20: qty 2

## 2021-10-20 MED ORDER — LABETALOL HCL 200 MG PO TABS
400.0000 mg | ORAL_TABLET | Freq: Three times a day (TID) | ORAL | Status: DC
  Administered 2021-10-21 – 2021-10-25 (×13): 400 mg via ORAL
  Filled 2021-10-20 (×13): qty 2

## 2021-10-20 MED ORDER — SODIUM CHLORIDE 0.9% FLUSH
3.0000 mL | Freq: Two times a day (BID) | INTRAVENOUS | Status: DC
  Administered 2021-10-20 – 2021-10-25 (×8): 3 mL via INTRAVENOUS

## 2021-10-20 NOTE — ED Provider Notes (Signed)
[facility] Provider Note  CSN: [id number] Arrival date & time: 10/20/21 1536  Chief Complaint(s) Cough  HPI Jodi Bell is a 61 y.o. female with a history of COPD, diabetes, end-stage renal disease on Monday, Wednesday, Friday dialysis, presenting to the emergency department with shortness of breath.  Patient reports shortness of breath and cough for the past 2 weeks.  She reports cough is occasionally productive.  She also reports nausea, vomiting, diarrhea over this period.  She normally uses 2 L of oxygen at home.  Symptoms have been slightly worse recently.  She reports being compliant with dialysis.  Denies chest pain, diaphoresis.  Reports compliance with home medications.  No fevers or chills   Past Medical History Past Medical History:  Diagnosis Date   Anemia of chronic disease    Anxiety    Arthritis    oa and psoriatic ra   Asthma    Back pain, chronic    lower back   Candida infection finished tx 2 days ago   Chest pain 07/16/2021   Chronic insomnia    COPD (chronic obstructive pulmonary disease) (HCC)    Coronary artery calcification seen on CT scan    Diabetes mellitus    type 2   Eczema    ESRD on dialysis [facility])    Essential hypertension    GERD (gastroesophageal reflux disease)    Gout    History of hiatal hernia    Hypoalbuminemia    Hyponatremia    Melena 09/20/2017   Mild aortic stenosis    Mitral regurgitation    Mitral stenosis    Obesity    PAD (peripheral artery disease) (HCC)    a. externial iliac calcification seen on CT 04/2020.   Pneumonia few yrs ago x 2   Sleep apnea    Tobacco abuse    Upper GI bleed 05/2019   Patient Active Problem List   Diagnosis Date Noted   Acute on chronic respiratory failure with hypoxia ([place]) 10/20/2021   Back pain 07/18/2021   Adenovirus infection, unspecified    Volume overload 06/26/2021   COVID-19 02/13/2021   Coronary artery disease involving coronary bypass graft of native  heart with angina pectoris ([place]) 01/10/2021   Paroxysmal atrial fibrillation ([place]) 01/10/2021   Aortic stenosis 01/10/2021   Mitral stenosis 01/10/2021   Chronic diastolic CHF (congestive heart failure) ([place]) 01/02/2021   Pressure injury of skin 09/21/2020   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/13/2020   GI bleeding 05/17/2019   Intervertebral cervical disc disorder with myelopathy, cervical region 09/14/2018   OSA (obstructive sleep apnea) 09/14/2018   Asthma 05/16/2018   ESRD (end stage renal disease) ([place]) 05/16/2018   Status post left partial knee replacement 11/12/2017   GERD (gastroesophageal reflux disease) 09/20/2017   Tobacco use 09/20/2017   Anemia 09/20/2017   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 Authorizing Provider  acetaminophen (TYLENOL) 500 MG tablet Take 1,000 mg by mouth daily as needed (pain).   Yes [provider]  albuterol (PROVENTIL) (2.5 MG/3ML) 0.083% nebulizer solution Take 3 mLs (2.5 mg total) by nebulization every 6 (six) hours as needed for wheezing or shortness of breath. 07/10/21  Yes Hunsucker, Bonna Gains, MD  allopurinol (ZYLOPRIM) 100 MG tablet Take  100 mg by mouth every evening.   Yes [provider]  calcium acetate (PHOSLO) 667 MG capsule Take 1,334 mg by mouth 3 (three) times daily with meals. 05/13/21  Yes [provider]  carboxymethylcellulose 1 % ophthalmic solution Place 2 drops into both eyes 2 (two) times daily as needed (dry eyes).   Yes [provider]  cetirizine (ZYRTEC) 10 MG tablet Take 10 mg by mouth daily as needed for allergies.   Yes [provider]  cholecalciferol (VITAMIN D) 25 MCG tablet Take 1 tablet (1,000 Units total) by mouth every evening.  10/04/20  Yes Gold, Wayne E, PA-C  cholecalciferol (VITAMIN D3) 10 MCG (400 UNIT) TABS tablet Take 800 Units by mouth daily.   Yes [provider]  cinacalcet (SENSIPAR) 60 MG tablet Take 60 mg by mouth daily.   Yes [provider]  cloNIDine (CATAPRES) 0.3 MG tablet Take 0.3 mg by mouth 3 (three) times daily.   Yes [provider]  cyclobenzaprine (FLEXERIL) 10 MG tablet Take 10 mg by mouth 2 (two) times daily as needed for muscle spasms.   Yes [provider]  Darbepoetin Alfa (ARANESP) 200 MCG/0.4ML SOSY injection Inject 0.4 mLs (200 mcg total) into the vein every Wednesday with hemodialysis. 10/10/20  Yes Gold, Wayne E, PA-C  diclofenac Sodium (VOLTAREN) 1 % GEL Apply 2 g topically daily as needed (pain).   Yes [provider]  docusate sodium (COLACE) 100 MG capsule Take 100-200 mg by mouth daily as needed for mild constipation.   Yes [provider]  ferrous sulfate 325 (65 FE) MG tablet Take 325 mg by mouth See admin instructions. Take 325 mg by mouth three times a week Tues, Thur, Sat   Yes [provider]  folic acid (FOLVITE) 193 MCG tablet Take 400 mcg by mouth daily.   Yes [provider]  gabapentin (NEURONTIN) 300 MG capsule Take 1 capsule (300 mg total) by mouth at bedtime as needed (Neuropathic pain). Patient taking differently: Take 300 mg by mouth daily as needed (Neuropathic pain). 12/14/17 10/21/21 Yes Dana Allan I, MD  heparin 1000 unit/mL SOLN injection 3 mLs (3,000 Units total) by Dialysis route as needed (in dialysis as needed). 10/04/20  Yes Gold, Wayne E, PA-C  heparin 1000 unit/mL SOLN injection 2 mLs (2,000 Units total) by Dialysis route as needed (in dialysis as needed). 10/04/20  Yes Gold, Wayne E, PA-C  heparin 1000 unit/mL SOLN injection 1 mL (1,000 Units total) by Dialysis route as needed (in dialysis). 10/04/20  Yes Gold, Wayne E, PA-C  hydrALAZINE (APRESOLINE) 100 MG tablet Take 1 tablet (100 mg  total) by mouth every 8 (eight) hours. Patient taking differently: Take 100 mg by mouth 3 (three) times daily. 07/06/20  Yes Cresenzo, Angelyn Punt, MD  hydrocortisone (ANUSOL-HC) 2.5 % rectal cream Place rectally 2 (two) times daily. Patient taking differently: Place 1 Application rectally daily as needed for hemorrhoids or anal itching. 07/23/21  Yes [name], MD  isosorbide mononitrate (IMDUR) 60 MG 24 hr tablet Take 1 tablet (60 mg total) by mouth daily. 08/14/21  Yes Bhagat, Bhavinkumar, PA  labetalol (NORMODYNE) 200 MG tablet Take 2 tablets (400 mg total) by mouth 3 (three) times daily. 07/11/21  Yes Bhagat, Bhavinkumar, PA  lidocaine (LIDODERM) 5 % Place 1 patch onto the skin every 12 (twelve) hours as needed. Remove & Discard patch within 12 hours or as directed by MD Patient taking differently: Place 1 patch onto the skin daily  as needed (pain). Remove & Discard patch within 12 hours or as directed by MD 07/23/21  Yes [name], MD  LORazepam (ATIVAN) 1 MG tablet Take 1 mg by mouth every 8 (eight) hours as needed for anxiety. 06/25/20  Yes [provider]  montelukast (SINGULAIR) 10 MG tablet Take 10 mg by mouth daily with supper.    Yes [provider]  NIFEdipine (PROCARDIA XL/NIFEDICAL XL) 60 MG 24 hr tablet Take 60 mg by mouth 2 (two) times daily. 10/01/21  Yes [provider]  olmesartan (BENICAR) 40 MG tablet Take 40 mg by mouth every evening. 05/06/19  Yes [provider]  ondansetron (ZOFRAN) 4 MG tablet Take 4 mg by mouth 2 (two) times daily as needed for nausea or vomiting.   Yes [provider]  OVER THE COUNTER MEDICATION Take by mouth daily as needed (sickness). Elderberry Fruit and flower 1 dropper full by mouth daily as needed for sickness   Yes [provider]  Oxycodone HCl 10 MG TABS Take 10 mg by mouth daily as needed for pain. 01/11/21  Yes [provider]  OXYGEN Inhale 2 L into the lungs as directed.   Yes [provider]  pantoprazole (PROTONIX) 40 MG tablet Take 40 mg by mouth daily. 06/09/20  Yes [provider]  predniSONE (DELTASONE) 5 MG tablet Take 4 tablets (20 mg total) by mouth daily with breakfast for 2 days, THEN 1 tablet (5 mg total) daily with breakfast. Patient taking differently: Take 1 tablet (5 mg total) daily with breakfast. 06/29/21 12/28/21 Yes Amponsah, Charisse March, MD  Probiotic Product (PROBIOTIC PO) Take 1 capsule by mouth daily.   Yes [provider]  sorbitol 70 % SOLN Take 30 ml 1-2 times daily as needed for constipation 07/23/21  Yes [name], MD  Tiotropium Bromide Monohydrate (SPIRIVA RESPIMAT) 2.5 MCG/ACT AERS Inhale 2 puffs into the lungs 2 (two) times daily as needed (wheezing/SOB). 07/10/21  Yes Hunsucker, Bonna Gains, MD  VENTOLIN HFA 108 (90 Base) MCG/ACT inhaler Inhale 2 puffs into the lungs See admin instructions. Take 2 puffs every 4-6 hours as needed for wheezing and shortness of breath 07/10/21  Yes Hunsucker, Bonna Gains, MD  zolpidem (AMBIEN) 5 MG tablet Take 5 mg by mouth at bedtime as needed for sleep. 02/04/21  Yes [provider]  aspirin EC 81 MG tablet Take 1 tablet (81 mg total) by mouth daily. Swallow whole. 10/15/21   Leanor Kail, PA                                                                                                                                    Past Surgical History Past Surgical History:  Procedure Laterality Date   BACK SURGERY  2016   lower back fusion with cage   BIOPSY  09/23/2017   Procedure: BIOPSY;  Surgeon: Wonda Horner, MD;  Location: [facility];  Service: Endoscopy;;   BIOPSY  05/19/2019   Procedure: BIOPSY;  Surgeon: Otis Brace, MD;  Location: [place];  Service: Gastroenterology;;   CESAREAN SECTION  1990   x 1    COLONOSCOPY WITH PROPOFOL N/A 09/23/2017   Procedure: COLONOSCOPY WITH PROPOFOL Hemostatic clips placed;  Surgeon: Wonda Horner, MD;  Location: [facility];   Service: Endoscopy;  Laterality: N/A;   COLONOSCOPY WITH PROPOFOL N/A 10/02/2020   Procedure: COLONOSCOPY WITH PROPOFOL;  Surgeon: Arta Silence, MD;  Location: [place];  Service: Endoscopy;  Laterality: N/A;   CORONARY ARTERY BYPASS GRAFT N/A 09/13/2020   Procedure: CORONARY ARTERY BYPASS GRAFTING (CABG), ON PUMP, TIMES TWO, USING LEFT INTERNAL MAMMARY ARTERY AND RIGHT ENDOSCOPICALLY HARVESTED GREATER SAPHENOUS VEIN;  Surgeon: Gaye Pollack, MD;  Location: [place];  Service: Open Heart Surgery;  Laterality: N/A;   DILATION AND CURETTAGE OF UTERUS  1988   ENTEROSCOPY N/A 07/22/2021   Procedure: ENTEROSCOPY;  Surgeon: Ronnette Juniper, MD;  Location: [place];  Service: Gastroenterology;  Laterality: N/A;   ESOPHAGOGASTRODUODENOSCOPY N/A 09/22/2020   Procedure: ESOPHAGOGASTRODUODENOSCOPY (EGD);  Surgeon: Arta Silence, MD;  Location[facility];  Service: Endoscopy;  Laterality: N/A;   ESOPHAGOGASTRODUODENOSCOPY (EGD) WITH PROPOFOL N/A 09/23/2017   Procedure: ESOPHAGOGASTRODUODENOSCOPY (EGD) WITH PROPOFOL;  Surgeon: Wonda Horner, MD;  [facility];  Service: Endoscopy;  Laterality: N/A;   ESOPHAGOGASTRODUODENOSCOPY (EGD) WITH PROPOFOL N/A 05/19/2019   Procedure: ESOPHAGOGASTRODUODENOSCOPY (EGD) WITH PROPOFOL;  Surgeon: Otis Brace, MD;  Location: [place];  Service: Gastroenterology;  Laterality: N/A;   ESOPHAGOGASTRODUODENOSCOPY (EGD) WITH PROPOFOL N/A 10/02/2020   Procedure: ESOPHAGOGASTRODUODENOSCOPY (EGD) WITH PROPOFOL;  Surgeon: Arta Silence, MD;  Location: [place];  Service: Endoscopy;  Laterality: N/A;   GIVENS CAPSULE STUDY N/A 05/19/2019   Procedure: GIVENS CAPSULE STUDY;  Surgeon: Otis Brace, MD;  Location: [place];  Service: Gastroenterology;  Laterality: N/A;   GIVENS CAPSULE STUDY N/A 07/19/2021   Procedure: GIVENS CAPSULE STUDY;  Surgeon: Clarene Essex, MD;  Location: [place];  Service: Gastroenterology;  Laterality: N/A;   HEMOSTASIS CLIP  PLACEMENT  09/22/2020   Procedure: HEMOSTASIS CLIP PLACEMENT;  Surgeon: Arta Silence, MD;  Location: [place];  Service: Endoscopy;;   HEMOSTASIS CONTROL  09/22/2020   Procedure: HEMOSTASIS CONTROL;  Surgeon: Arta Silence, MD;  Location: [place];  Service: Endoscopy;;   HOT HEMOSTASIS N/A 05/19/2019   Procedure: HOT HEMOSTASIS (ARGON PLASMA COAGULATION/BICAP);  Surgeon: Otis Brace, MD;  Location: [facility] ENDOSCOPY;  Service: Gastroenterology;  Laterality: N/A;   LEFT HEART CATH AND CORONARY ANGIOGRAPHY N/A 09/10/2020   Procedure: LEFT HEART CATH AND CORONARY ANGIOGRAPHY;  Surgeon: Nelva Bush, MD;  Location: [place] [facility];  Service: Cardiovascular;  Laterality: N/A;   PARTIAL KNEE ARTHROPLASTY Left 11/12/2017   Procedure: left unicompartmental arthroplasty-medial;  Surgeon: Paralee Cancel, MD;  Location: [facility];  Service: Orthopedics;  Laterality: Left;  74min   SUBMUCOSAL INJECTION  09/23/2017   Procedure: SUBMUCOSAL INJECTION;  Surgeon: Wonda Horner, MD;  Location: [facility];  Service: Endoscopy;;  in colon   TEE WITHOUT CARDIOVERSION N/A 09/13/2020   Procedure: TRANSESOPHAGEAL ECHOCARDIOGRAM (TEE);  Surgeon: Gaye Pollack, MD;  Location: [place];  Service: Open Heart Surgery;  Laterality: N/A;   TUBAL LIGATION  1990   Family History Family History  Problem Relation Age of Onset   Lung cancer Mother    Diabetes Sister    Heart disease Sister    Asthma Daughter    Arthritis Daughter    Arthritis  Daughter    Thyroid cancer Other        1/2 sister   Heart disease Other        1/2 sister    Social History Social History   Tobacco Use   Smoking status: Former    Packs/day: 0.50    Years: 40.00    Total pack years: 20.00    Types: Cigarettes    Quit date: 08/2020    Years since quitting: 1.1   Smokeless tobacco: Never  Vaping Use   Vaping Use: Never used  Substance Use Topics   Alcohol use: Yes    Comment: occ   Drug use: No    Allergies 3-methyl-2-benzothiazolinone hydrazone, Banana, Black walnut flavor, Hazelnut (filbert) allergy skin test, Leflunomide and related, Lisinopril, No healthtouch food allergies, Other, Pecan extract allergy skin test, Pecan nut (diagnostic), Trazodone and nefazodone, Adalimumab, Escitalopram oxalate, Ezetimibe, Secukinumab, Statins, Ferrlecit [na ferric gluc cplx in sucrose], Gabapentin, Ibuprofen, and Duloxetine  Review of Systems Review of Systems  All other systems reviewed and are negative.   Physical Exam Vital Signs  I have reviewed the triage vital signs BP (!) 216/80 (BP Location: Right Arm)   Pulse 63   Temp 98.9 F (37.2 C) (Oral)   Resp 19   Ht 5\' 2"  (1.575 m)   Wt 60.7 kg   SpO2 99%   BMI 24.48 kg/m  Physical Exam Vitals and nursing note reviewed.  Constitutional:      General: She is not in acute distress.    Appearance: She is well-developed.  HENT:     Head: Normocephalic and atraumatic.     Mouth/Throat:     Mouth: Mucous membranes are moist.  Eyes:     Pupils: Pupils are equal, round, and reactive to light.  Cardiovascular:     Rate and Rhythm: Normal rate and regular rhythm.     Heart sounds: No murmur heard. Pulmonary:     Effort: Pulmonary effort is normal. No respiratory distress.     Breath sounds: Wheezing (diffusely) and rales (basilar) present.  Abdominal:     General: Abdomen is flat.     Palpations: Abdomen is soft.     Tenderness: There is no abdominal tenderness.  Musculoskeletal:        General: No tenderness.     Right lower leg: No edema.     Left lower leg: No edema.  Skin:    General: Skin is warm and dry.  Neurological:     General: No focal deficit present.     Mental Status: She is alert. Mental status is at baseline.  Psychiatric:        Mood and Affect: Mood normal.        Behavior: Behavior normal.     ED Results and Treatments Labs (all labs ordered are listed, but only abnormal results are displayed) Labs  Reviewed  CBC WITH DIFFERENTIAL/PLATELET - Abnormal; Notable for the following components:      Result Value   RBC 2.71 (*)    Hemoglobin 9.2 (*)    HCT 28.4 (*)    MCV 104.8 (*)    Eosinophils Absolute 0.9 (*)    All other components within normal limits  COMPREHENSIVE METABOLIC PANEL - Abnormal; Notable for the following components:   Sodium 133 (*)    Chloride 95 (*)    Glucose, Bld 105 (*)    Creatinine, Ser 6.57 (*)    Calcium 8.7 (*)    Albumin 3.3 (*)  Alkaline Phosphatase 260 (*)    GFR, Estimated 7 (*)    All other components within normal limits  BRAIN NATRIURETIC PEPTIDE - Abnormal; Notable for the following components:   B Natriuretic Peptide >4,500.0 (*)    All other components within normal limits  CBC - Abnormal; Notable for the following components:   RBC 2.38 (*)    Hemoglobin 8.5 (*)    HCT 24.9 (*)    MCV 104.6 (*)    MCH 35.7 (*)    All other components within normal limits  RENAL FUNCTION PANEL - Abnormal; Notable for the following components:   Sodium 131 (*)    Chloride 95 (*)    Glucose, Bld 193 (*)    BUN 25 (*)    Creatinine, Ser 7.61 (*)    Albumin 2.9 (*)    GFR, Estimated 6 (*)    All other components within normal limits  GLUCOSE, CAPILLARY - Abnormal; Notable for the following components:   Glucose-Capillary 165 (*)    All other components within normal limits  GLUCOSE, CAPILLARY - Abnormal; Notable for the following components:   Glucose-Capillary 110 (*)    All other components within normal limits  I-STAT VENOUS BLOOD GAS, ED - Abnormal; Notable for the following components:   pH, Ven 7.485 (*)    pCO2, Ven 36.5 (*)    pO2, Ven 141 (*)    Acid-Base Excess 4.0 (*)    Sodium 132 (*)    Calcium, Ion 1.00 (*)    HCT 29.0 (*)    Hemoglobin 9.9 (*)    All other components within normal limits  RESP PANEL BY RT-PCR (FLU A&B, COVID) ARPGX2  RESPIRATORY PANEL BY PCR  PROTIME-INR  HEPATITIS B SURFACE ANTIGEN  HEPATITIS B SURFACE  ANTIBODY,QUALITATIVE  HEPATITIS B SURFACE ANTIBODY, QUANTITATIVE  HEPATITIS B CORE ANTIBODY, TOTAL  HEPATITIS C ANTIBODY  LEGIONELLA PNEUMOPHILA TOTAL AB                                                                                                                          Radiology CT Angio Chest PE W/Cm &/Or Wo Cm  Result Date: 10/20/2021 CLINICAL DATA:  Pulmonary embolism suspected. Unknown D-dimer. Productive cough, intermittent diarrhea and nausea for 2 weeks. EXAM: CT ANGIOGRAPHY CHEST WITH CONTRAST TECHNIQUE: Multidetector CT imaging of the chest was performed using the standard protocol during bolus administration of intravenous contrast. Multiplanar CT image reconstructions and MIPs were obtained to evaluate the vascular anatomy. RADIATION DOSE REDUCTION: This exam was performed according to the departmental dose-optimization program which includes automated exposure control, adjustment of the mA and/or kV according to patient size and/or use of iterative reconstruction technique. CONTRAST:  93mL OMNIPAQUE IOHEXOL 350 MG/ML SOLN COMPARISON:  Chest CT dated 08/27/2021. chest CT dated 09/23/2020. FINDINGS: Cardiovascular: Some of the most peripheral segmental and subsegmental pulmonary artery branches can not be definitively characterized due to considerable patient breathing motion artifact, however, there is no pulmonary embolism  identified within the main, lobar or central segmental pulmonary arteries bilaterally. Cardiomegaly. No substantial pericardial effusion. No thoracic aortic aneurysm or evidence of aortic dissection. Three-vessel coronary artery calcifications. Surgical changes of median sternotomy and CABG. Mediastinum/Nodes: Conglomerate lymphadenopathy within the subcarinal space of the mediastinum, significantly increased compared to chest CT of 09/23/2020 and similar in appearance compared to more recent chest CT of 01/02/2021. Numerous additional small and moderately enlarged lymph  nodes throughout the remainder of the mediastinum. Esophagus is unremarkable, where seen. Trachea and central bronchi are unremarkable. Lungs/Pleura: Patchy consolidations within the RIGHT lower lobe. Mild atelectasis at the LEFT lung base. Small RIGHT pleural effusion. No pneumothorax. Upper Abdomen: No acute findings. Musculoskeletal: No acute-appearing osseous abnormality. Review of the MIP images confirms the above findings. IMPRESSION: 1. Patchy consolidations within the RIGHT lower lobe, likely pneumonia. 2. Conglomerate lymphadenopathy within the subcarinal space of the mediastinum, significantly increased compared to chest CT of 09/23/2020 and similar in appearance compared to more recent chest CT of 01/02/2021, differential considerations including benign reactive lymphadenopathy, metastatic lymphadenopathy and lymphoma. 3. No pulmonary embolism seen, with mild study limitations detailed above. 4. Marked cardiomegaly. Three-vessel coronary artery calcifications. Surgical changes of median sternotomy and CABG. 5. Small RIGHT pleural effusion. Electronically Signed   By: Franki Cabot M.D.   On: 10/20/2021 18:12   DG Chest 2 View  Result Date: 10/20/2021 CLINICAL DATA:  Cough 2 weeks with shortness of breath.  Hypoxia. EXAM: CHEST - 2 VIEW COMPARISON:  10/02/2021 FINDINGS: Lungs are adequately inflated with persistent hazy perihilar opacification compatible with mild vascular congestion/edema. No significant effusion. Moderate stable cardiomegaly. Remainder of the exam is unchanged. IMPRESSION: Moderate stable cardiomegaly with findings suggesting mild vascular congestion/edema. Electronically Signed   By: Marin Olp M.D.   On: 10/20/2021 16:31    Pertinent labs & imaging results that were available during my care of the patient were reviewed by me and considered in my medical decision making (see MDM for details).  Medications Ordered in ED Medications  cefTRIAXone (ROCEPHIN) 1 g in sodium  chloride 0.9 % 100 mL IVPB (0 g Intravenous Stopped 10/20/21 2021)  azithromycin (ZITHROMAX) 500 mg in sodium chloride 0.9 % 250 mL IVPB (0 mg Intravenous Stopped 10/20/21 2120)  Chlorhexidine Gluconate Cloth 2 % PADS 6 each (6 each Topical Given 10/21/21 0533)  aspirin EC tablet 81 mg (has no administration in time range)  albuterol (PROVENTIL) (2.5 MG/3ML) 0.083% nebulizer solution 2.5 mg (has no administration in time range)  montelukast (SINGULAIR) tablet 10 mg (has no administration in time range)  predniSONE (DELTASONE) tablet 5 mg (5 mg Oral Given 10/21/21 0807)  heparin injection 5,000 Units (5,000 Units Subcutaneous Given 10/21/21 0533)  sodium chloride flush (NS) 0.9 % injection 3 mL (3 mLs Intravenous Given 10/20/21 2337)  acetaminophen (TYLENOL) tablet 650 mg (650 mg Oral Given 10/21/21 0038)    Or  acetaminophen (TYLENOL) suppository 650 mg ( Rectal See Alternative 10/21/21 0038)  polyethylene glycol (MIRALAX / GLYCOLAX) packet 17 g (has no administration in time range)  umeclidinium bromide (INCRUSE ELLIPTA) 62.5 MCG/ACT 1 puff (1 puff Inhalation Given 10/21/21 0747)  hydrALAZINE (APRESOLINE) tablet 100 mg (100 mg Oral Given 10/21/21 0807)  isosorbide mononitrate (IMDUR) 24 hr tablet 60 mg (60 mg Oral Given 10/21/21 0807)  labetalol (NORMODYNE) tablet 400 mg (400 mg Oral Given 10/21/21 1036)  LORazepam (ATIVAN) tablet 1 mg (has no administration in time range)  NIFEdipine (PROCARDIA XL/NIFEDICAL XL) 24 hr tablet 60 mg (60 mg  Oral Given 10/21/21 1036)  irbesartan (AVAPRO) tablet 300 mg (has no administration in time range)  pantoprazole (PROTONIX) EC tablet 40 mg (40 mg Oral Given 10/21/21 0807)  zolpidem (AMBIEN) tablet 5 mg (5 mg Oral Given 10/21/21 0047)  cloNIDine (CATAPRES) tablet 0.3 mg (0.3 mg Oral Given 10/21/21 1036)  insulin aspart (novoLOG) injection 0-6 Units ( Subcutaneous Not Given 10/21/21 1149)  guaiFENesin (ROBITUSSIN) 100 MG/5ML liquid 10 mL (10 mLs Oral Given 10/21/21 0533)   ipratropium-albuterol (DUONEB) 0.5-2.5 (3) MG/3ML nebulizer solution 3 mL (has no administration in time range)  benzonatate (TESSALON) capsule 100 mg (has no administration in time range)  (feeding supplement) PROSource Plus liquid 30 mL (30 mLs Oral Given 10/21/21 1222)  Darbepoetin Alfa (ARANESP) injection 200 mcg (has no administration in time range)  ipratropium-albuterol (DUONEB) 0.5-2.5 (3) MG/3ML nebulizer solution 3 mL (3 mLs Nebulization Given 10/20/21 1640)  iohexol (OMNIPAQUE) 350 MG/ML injection 75 mL (75 mLs Intravenous Contrast Given 10/20/21 1746)  methylPREDNISolone sodium succinate (SOLU-MEDROL) 125 mg/2 mL injection 125 mg (125 mg Intravenous Given 10/20/21 1944)  benzonatate (TESSALON) capsule 200 mg (200 mg Oral Given 10/20/21 2013)                                                                                                                                     Procedures .Critical Care  Performed by: Cristie Hem, MD Authorized by: Cristie Hem, MD   Critical care provider statement:    Critical care time (minutes):  30   Critical care was necessary to treat or prevent imminent or life-threatening deterioration of the following conditions:  Respiratory failure, metabolic crisis and sepsis   Critical care was time spent personally by me on the following activities:  Development of treatment plan with patient or surrogate, discussions with consultants, evaluation of patient's response to treatment, examination of patient, ordering and review of laboratory studies, ordering and review of radiographic studies, ordering and performing treatments and interventions, pulse oximetry, re-evaluation of patient's condition and review of old charts   (including critical care time)  Medical Decision Making / ED Course   MDM:  61 year old female presenting to the emergency department with cough.  Patient chronically ill-appearing, no acute distress.  Pulmonary exam  with diffuse wheezing, mild basilar crackles.  Hypoxic on room air, although patient uses 2 L of oxygen at home.  No peripheral edema.  Presentation suspicious for COPD exacerbation versus pneumonia.  Could also be related to volume overload in the setting of inadequate dialysis, patient reports that they have not been taking off fluid recently.  Will obtain CT angiography to evaluate for pulmonary embolism.  Will give breathing treatment and reassess.  Clinical Course as of 10/21/21 1307  Sun Oct 20, 2021  1835 Discussed with Dr. Jonnie Finner who will see patient tomorrow for dialysis.  [WS]  1908 Discussed with the hospitalist who will admit the patient for pneumonia.  [  WS]    Clinical Course User Index [WS] Cristie Hem, MD     Additional history obtained:  -External records from outside source obtained and reviewed including: Chart review including previous notes, labs, imaging, consultation notes   Lab Tests: -I ordered, reviewed, and interpreted labs.   The pertinent results include:   Labs Reviewed  CBC WITH DIFFERENTIAL/PLATELET - Abnormal; Notable for the following components:      Result Value   RBC 2.71 (*)    Hemoglobin 9.2 (*)    HCT 28.4 (*)    MCV 104.8 (*)    Eosinophils Absolute 0.9 (*)    All other components within normal limits  COMPREHENSIVE METABOLIC PANEL - Abnormal; Notable for the following components:   Sodium 133 (*)    Chloride 95 (*)    Glucose, Bld 105 (*)    Creatinine, Ser 6.57 (*)    Calcium 8.7 (*)    Albumin 3.3 (*)    Alkaline Phosphatase 260 (*)    GFR, Estimated 7 (*)    All other components within normal limits  BRAIN NATRIURETIC PEPTIDE - Abnormal; Notable for the following components:   B Natriuretic Peptide >4,500.0 (*)    All other components within normal limits  CBC - Abnormal; Notable for the following components:   RBC 2.38 (*)    Hemoglobin 8.5 (*)    HCT 24.9 (*)    MCV 104.6 (*)    MCH 35.7 (*)    All other  components within normal limits  RENAL FUNCTION PANEL - Abnormal; Notable for the following components:   Sodium 131 (*)    Chloride 95 (*)    Glucose, Bld 193 (*)    BUN 25 (*)    Creatinine, Ser 7.61 (*)    Albumin 2.9 (*)    GFR, Estimated 6 (*)    All other components within normal limits  GLUCOSE, CAPILLARY - Abnormal; Notable for the following components:   Glucose-Capillary 165 (*)    All other components within normal limits  GLUCOSE, CAPILLARY - Abnormal; Notable for the following components:   Glucose-Capillary 110 (*)    All other components within normal limits  I-STAT VENOUS BLOOD GAS, ED - Abnormal; Notable for the following components:   pH, Ven 7.485 (*)    pCO2, Ven 36.5 (*)    pO2, Ven 141 (*)    Acid-Base Excess 4.0 (*)    Sodium 132 (*)    Calcium, Ion 1.00 (*)    HCT 29.0 (*)    Hemoglobin 9.9 (*)    All other components within normal limits  RESP PANEL BY RT-PCR (FLU A&B, COVID) ARPGX2  RESPIRATORY PANEL BY PCR  PROTIME-INR  HEPATITIS B SURFACE ANTIGEN  HEPATITIS B SURFACE ANTIBODY,QUALITATIVE  HEPATITIS B SURFACE ANTIBODY, QUANTITATIVE  HEPATITIS B CORE ANTIBODY, TOTAL  HEPATITIS C ANTIBODY  LEGIONELLA PNEUMOPHILA TOTAL AB      EKG   EKG Interpretation  Date/Time:  Sunday October 20 2021 15:48:51 EDT Ventricular Rate:  66 PR Interval:  176 QRS Duration: 90 QT Interval:  450 QTC Calculation: 471 R Axis:   -24 Text Interpretation: Normal sinus rhythm Minimal voltage criteria for LVH, may be normal variant ( Cornell product ) Borderline ECG Confirmed by Garnette Gunner [phone]) on 10/20/2021 6:50:29 PM         Imaging Studies ordered: I ordered imaging studies including CXR, CTA chest On my interpretation imaging demonstrates right sided pneumonia I independently visualized and interpreted imaging. I  agree with the radiologist interpretation   Medicines ordered and prescription drug management: Meds ordered this encounter   Medications   ipratropium-albuterol (DUONEB) 0.5-2.5 (3) MG/3ML nebulizer solution 3 mL   iohexol (OMNIPAQUE) 350 MG/ML injection 75 mL   cefTRIAXone (ROCEPHIN) 1 g in sodium chloride 0.9 % 100 mL IVPB    Order Specific Question:   Antibiotic Indication:    Answer:   CAP   azithromycin (ZITHROMAX) 500 mg in sodium chloride 0.9 % 250 mL IVPB   methylPREDNISolone sodium succinate (SOLU-MEDROL) 125 mg/2 mL injection 125 mg    IV methylprednisolone will be converted to either a q12h or q24h frequency with the same total daily dose (TDD).  Ordered Dose: 1 to 125 mg TDD; convert to: TDD q24h.  Ordered Dose: 126 to 250 mg TDD; convert to: TDD div q12h.  Ordered Dose: >250 mg TDD; DAW.   Chlorhexidine Gluconate Cloth 2 % PADS 6 each   benzonatate (TESSALON) capsule 200 mg   aspirin EC tablet 81 mg    Swallow whole.     albuterol (PROVENTIL) (2.5 MG/3ML) 0.083% nebulizer solution 2.5 mg   montelukast (SINGULAIR) tablet 10 mg   predniSONE (DELTASONE) tablet 5 mg   DISCONTD: Tiotropium Bromide Monohydrate AERS 2 puff   heparin injection 5,000 Units   sodium chloride flush (NS) 0.9 % injection 3 mL   OR Linked Order Group    acetaminophen (TYLENOL) tablet 650 mg    acetaminophen (TYLENOL) suppository 650 mg   polyethylene glycol (MIRALAX / GLYCOLAX) packet 17 g   umeclidinium bromide (INCRUSE ELLIPTA) 62.5 MCG/ACT 1 puff   hydrALAZINE (APRESOLINE) tablet 100 mg   isosorbide mononitrate (IMDUR) 24 hr tablet 60 mg   labetalol (NORMODYNE) tablet 400 mg   LORazepam (ATIVAN) tablet 1 mg   NIFEdipine (PROCARDIA XL/NIFEDICAL XL) 24 hr tablet 60 mg   irbesartan (AVAPRO) tablet 300 mg   pantoprazole (PROTONIX) EC tablet 40 mg   zolpidem (AMBIEN) tablet 5 mg   cloNIDine (CATAPRES) tablet 0.3 mg   insulin aspart (novoLOG) injection 0-6 Units    Order Specific Question:   Correction coverage:    Answer:   Very Sensitive (ESRD/Dialysis)    Order Specific Question:   CBG < 70:    Answer:   Implement  Hypoglycemia Standing Orders and refer to Hypoglycemia Standing Orders sidebar report    Order Specific Question:   CBG 70 - 120:    Answer:   0 units    Order Specific Question:   CBG 121 - 150:    Answer:   0 units    Order Specific Question:   CBG 151 - 200:    Answer:   1 unit    Order Specific Question:   CBG 201-250:    Answer:   2 units    Order Specific Question:   CBG 251-300:    Answer:   3 units    Order Specific Question:   CBG 301-350:    Answer:   4 units    Order Specific Question:   CBG 351-400:    Answer:   5 units    Order Specific Question:   CBG > 400    Answer:   Give 6 units and call MD   guaiFENesin (ROBITUSSIN) 100 MG/5ML liquid 10 mL   ipratropium-albuterol (DUONEB) 0.5-2.5 (3) MG/3ML nebulizer solution 3 mL   benzonatate (TESSALON) capsule 100 mg   (feeding supplement) PROSource Plus liquid 30 mL   Darbepoetin Alfa (ARANESP) injection  200 mcg    -I have reviewed the patients home medicines and have made adjustments as needed   Consultations Obtained: I requested consultation with the nephrologist,  and discussed lab and imaging findings as well as pertinent plan - they recommend: dialysis tomorrow   Cardiac Monitoring: The patient was maintained on a cardiac monitor.  I personally viewed and interpreted the cardiac monitored which showed an underlying rhythm of: NSR  Social Determinants of Health:  Factors impacting patients care include: former smoker   Reevaluation: After the interventions noted above, I reevaluated the patient and found that they have improved  Co morbidities that complicate the patient evaluation  Past Medical History:  Diagnosis Date   Anemia of chronic disease    Anxiety    Arthritis    oa and psoriatic ra   Asthma    Back pain, chronic    lower back   Candida infection finished tx 2 days ago   Chest pain 07/16/2021   Chronic insomnia    COPD (chronic obstructive pulmonary disease) ([place])    Coronary artery  calcification seen on CT scan    Diabetes mellitus    type 2   Eczema    ESRD on dialysis [facility])    Essential hypertension    GERD (gastroesophageal reflux disease)    Gout    History of hiatal hernia    Hypoalbuminemia    Hyponatremia    Melena 09/20/2017   Mild aortic stenosis    Mitral regurgitation    Mitral stenosis    Obesity    PAD (peripheral artery disease) ([place])    a. externial iliac calcification seen on CT 04/2020.   Pneumonia few yrs ago x 2   Sleep apnea    Tobacco abuse    Upper GI bleed 05/2019      Dispostion: Admit    Final Clinical Impression(s) / ED Diagnoses Final diagnoses:  Hypoxia  Pneumonia due to infectious organism, unspecified laterality, unspecified part of lung  COPD exacerbation ([place])     This chart was dictated using voice recognition software.  Despite best efforts to proofread,  errors can occur which can change the documentation meaning.    Cristie Hem, MD 10/20/21 Pauline Aus    Cristie Hem, MD 10/21/21 1306    Cristie Hem, MD 10/21/21 [phone]

## 2021-10-20 NOTE — Progress Notes (Signed)
Pt taken from triage to ED RM 10. Pt with shortness of breath, SpO2 as low as 85% on room air. Per pt she wears 2-3L PRN/QHS. Pt placed on 4L nasal cannula. SpO2 increased to 96% and improvement in pts work of breathing.

## 2021-10-20 NOTE — ED Triage Notes (Signed)
Pt c/o productive cough, intermittent diarrhea and nausea x 2 week.

## 2021-10-20 NOTE — H&P (Addendum)
History and Physical   Jodi Bell KVQ:[id number] DOB: [birth year] DOA: 10/20/2021  PCP: [name]., MD   Patient coming from: Home  Chief Complaint: Shortness of breath  HPI: Jodi Bell is a 61 y.o. female with medical history significant of diabetes, hypertension, ESRD on HD, OSA, atrial fibrillation, GERD, asthma, anemia, CAD status post CABG, CHF presenting with worsening shortness of breath.  Patient reports 2 weeks of gradually worsening shortness of breath which has been a little worse the last few days.  States she is on chronic 2 L of oxygen. She also reports cough and concerned that she may have picked up something from her granddaughter from school.  She did complete her last dialysis session on Friday.  However she states she missed 2 dialysis session last week due to vomiting and diarrhea which is improving.  She denies fevers, chills, chest pain, abdominal pain, constipation, diarrhea, nausea, vomiting.  ED Course: All signs in the ED significant for blood pressure in the 643P to 295J systolic, requiring 4 L to maintain saturations able to be weaned down to 2 L per chart review.  Lab work-up included CMP with sodium 133, chloride 95, creatinine 6.5 consistent with ESRD, glucose 105, calcium 8.7, albumin 3.3, alk phos 260.  CBC with hemoglobin of 9.2 which stable.  PT and INR within normal limits.  BMP elevated at greater than 4500 which it has been in the past, rest were panel flu COVID-negative.  VBG mildly abnormal with pH 7.48 and PCO2 of 36.5.  Hepatitis B and C labs pending as well as Legionella labs.  Chest x-ray showed moderate cardiomegaly with vascular congestion.  CTA PE study was negative for PE but did show patchy consolidation of the right lower lobe suspicious for pneumonia.  Also noted was lymphadenopathy increased from previous but could be consistent with reactive versus other.  Cardiomegaly CAD and small right pleural effusion also noted.  Patient received  ceftriaxone azithromycin in the ED as well as Solu-Medrol and DuoNeb.  Nephrology was consulted for dialysis.  Review of Systems: As per HPI otherwise all other systems reviewed and are negative.  Past Medical History:  Diagnosis Date   Anemia of chronic disease    Anxiety    Arthritis    oa and psoriatic ra   Asthma    Back pain, chronic    lower back   Candida infection finished tx 2 days ago   Chest pain 07/16/2021   Chronic insomnia    COPD (chronic obstructive pulmonary disease) ([place])    Coronary artery calcification seen on CT scan    Diabetes mellitus    type 2   Eczema    ESRD on dialysis [facility])    Essential hypertension    GERD (gastroesophageal reflux disease)    Gout    History of hiatal hernia    Hypoalbuminemia    Hyponatremia    Melena 09/20/2017   Mild aortic stenosis    Mitral regurgitation    Mitral stenosis    Obesity    PAD (peripheral artery disease) (HCC)    a. externial iliac calcification seen on CT 04/2020.   Pneumonia few yrs ago x 2   Sleep apnea    Tobacco abuse    Upper GI bleed 05/2019    Past Surgical History:  Procedure Laterality Date   BACK SURGERY  2016   lower back fusion with cage   BIOPSY  09/23/2017   Procedure: BIOPSY;  Surgeon: Anson Fret  F, MD;  Location: [facility];  Service: Endoscopy;;   BIOPSY  05/19/2019   Procedure: BIOPSY;  Surgeon: Otis Brace, MD;  Location: [place];  Service: Gastroenterology;;   CESAREAN SECTION  1990   x 1    COLONOSCOPY WITH PROPOFOL N/A 09/23/2017   Procedure: COLONOSCOPY WITH PROPOFOL Hemostatic clips placed;  Surgeon: Wonda Horner, MD;  Location: [facility];  Service: Endoscopy;  Laterality: N/A;   COLONOSCOPY WITH PROPOFOL N/A 10/02/2020   Procedure: COLONOSCOPY WITH PROPOFOL;  Surgeon: Arta Silence, MD;  Location: [place];  Service: Endoscopy;  Laterality: N/A;   CORONARY ARTERY BYPASS GRAFT N/A 09/13/2020   Procedure: CORONARY ARTERY BYPASS GRAFTING (CABG), ON PUMP,  TIMES TWO, USING LEFT INTERNAL MAMMARY ARTERY AND RIGHT ENDOSCOPICALLY HARVESTED GREATER SAPHENOUS VEIN;  Surgeon: Gaye Pollack, MD;  Location: [place];  Service: Open Heart Surgery;  Laterality: N/A;   DILATION AND CURETTAGE OF UTERUS  1988   ENTEROSCOPY N/A 07/22/2021   Procedure: ENTEROSCOPY;  Surgeon: Ronnette Juniper, MD;  Location: [place];  Service: Gastroenterology;  Laterality: N/A;   ESOPHAGOGASTRODUODENOSCOPY N/A 09/22/2020   Procedure: ESOPHAGOGASTRODUODENOSCOPY (EGD);  Surgeon: Arta Silence, MD;  Location: [facility] ENDOSCOPY;  Service: Endoscopy;  Laterality: N/A;   ESOPHAGOGASTRODUODENOSCOPY (EGD) WITH PROPOFOL N/A 09/23/2017   Procedure: ESOPHAGOGASTRODUODENOSCOPY (EGD) WITH PROPOFOL;  Surgeon: Wonda Horner, MD;  [facility];  Service: Endoscopy;  Laterality: N/A;   ESOPHAGOGASTRODUODENOSCOPY (EGD) WITH PROPOFOL N/A 05/19/2019   Procedure: ESOPHAGOGASTRODUODENOSCOPY (EGD) WITH PROPOFOL;  Surgeon: Otis Brace, MD;  Location: [place];  Service: Gastroenterology;  Laterality: N/A;   ESOPHAGOGASTRODUODENOSCOPY (EGD) WITH PROPOFOL N/A 10/02/2020   Procedure: ESOPHAGOGASTRODUODENOSCOPY (EGD) WITH PROPOFOL;  Surgeon: Arta Silence, MD;  Location: [place];  Service: Endoscopy;  Laterality: N/A;   GIVENS CAPSULE STUDY N/A 05/19/2019   Procedure: GIVENS CAPSULE STUDY;  Surgeon: Otis Brace, MD;  Location: [place];  Service: Gastroenterology;  Laterality: N/A;   GIVENS CAPSULE STUDY N/A 07/19/2021   Procedure: GIVENS CAPSULE STUDY;  Surgeon: Clarene Essex, MD;  Location: [place];  Service: Gastroenterology;  Laterality: N/A;   HEMOSTASIS CLIP PLACEMENT  09/22/2020   Procedure: HEMOSTASIS CLIP PLACEMENT;  Surgeon: Arta Silence, MD;  Location: [place];  Service: Endoscopy;;   HEMOSTASIS CONTROL  09/22/2020   Procedure: HEMOSTASIS CONTROL;  Surgeon: Arta Silence, MD;  Location: [place];  Service: Endoscopy;;   HOT HEMOSTASIS N/A 05/19/2019    Procedure: HOT HEMOSTASIS (ARGON PLASMA COAGULATION/BICAP);  Surgeon: Otis Brace, MD;  Location: [facility] ENDOSCOPY;  Service: Gastroenterology;  Laterality: N/A;   LEFT HEART CATH AND CORONARY ANGIOGRAPHY N/A 09/10/2020   Procedure: LEFT HEART CATH AND CORONARY ANGIOGRAPHY;  Surgeon: Nelva Bush, MD;  Location: [place] CV LAB;  Service: Cardiovascular;  Laterality: N/A;   PARTIAL KNEE ARTHROPLASTY Left 11/12/2017   Procedure: left unicompartmental arthroplasty-medial;  Surgeon: Paralee Cancel, MD;  Location: [facility];  Service: Orthopedics;  Laterality: Left;  27min   SUBMUCOSAL INJECTION  09/23/2017   Procedure: SUBMUCOSAL INJECTION;  Surgeon: Wonda Horner, MD;  Location: [facility];  Service: Endoscopy;;  in colon   TEE WITHOUT CARDIOVERSION N/A 09/13/2020   Procedure: TRANSESOPHAGEAL ECHOCARDIOGRAM (TEE);  Surgeon: Gaye Pollack, MD;  Location: [place];  Service: Open Heart Surgery;  Laterality: N/A;   TUBAL LIGATION  1990    Social History  reports that she quit smoking about 14 months ago. Her smoking use included cigarettes. She has a 20.00 pack-year smoking history. She has never used smokeless tobacco. She reports  current alcohol use. She reports that she does not use drugs.  Allergies  Allergen Reactions   3-Methyl-2-Benzothiazolinone Hydrazone Other (See Comments)    Muscle weakness muscle cramping in legs Other reaction(s): Myalgias (Muscle Pain)   Banana Anaphylaxis, Swelling and Other (See Comments)    TONGUE AND MOUTH SWELLING   Black Walnut Flavor Anaphylaxis and Itching    Walnuts   Hazelnut ([place]) Allergy Skin Test Anaphylaxis    Hazelnuts   Leflunomide And Related Other (See Comments)    Severe Headache   Lisinopril Swelling    Angioedema  Swelling of the face   No [employer] Food Allergies Anaphylaxis    Spicy Mustard 4.7.2021 Pt reports that she eats Regular Yellow Mustard Swelling and itching of tongue   Other Other (See Comments), Anaphylaxis and  Swelling    Cosentyx= angioedema  Other reaction(s): Facial Edema (intolerance) Mouth itches and tongue swelling Hazel nuts and pecans also Walnuts Walnuts Renal failure Other reaction(s): Other Serotonin Syndrome  Serotonin syndrome  Tremors Other reaction(s): Other Tremors Muscle weakness muscle cramping in legs Other reaction(s): Myalgias (Muscle Pain) Mouth itches and tongue swelling Hazel nuts and pecans also Walnuts    Pecan Extract Allergy Skin Test Anaphylaxis and Itching    Pecans    Pecan Nut (Diagnostic) Anaphylaxis and Itching    Pecans   Trazodone And Nefazodone Other (See Comments)    Serotonin Syndrome  Tremors   Adalimumab Other (See Comments)    Blisters on abdomen =humira   Escitalopram Oxalate Other (See Comments)    Hand problems - Serotonin Syndrome     Ezetimibe Other (See Comments)    myalgias cramps    Secukinumab Swelling    Cosentyx = angiodema    Statins Other (See Comments)    Leg pains and weakness   Ferrlecit [Na Ferric Gluc Cplx In Sucrose]     Not reaction given from HD   Gabapentin Other (See Comments)    tremors   Ibuprofen Other (See Comments)    Renal Problems    Family History  Problem Relation Age of Onset   Lung cancer Mother    Diabetes Sister    Heart disease Sister    Asthma Daughter    Arthritis Daughter    Arthritis Daughter    Thyroid cancer Other        1/2 sister   Heart disease Other        1/2 sister  Reviewed on admission  Prior to Admission medications   Medication Sig Start Date End Date Taking? Authorizing Provider  acetaminophen (TYLENOL) 500 MG tablet Take 1,000 mg by mouth daily as needed (pain).    [provider]  albuterol (PROVENTIL) (2.5 MG/3ML) 0.083% nebulizer solution Take 3 mLs (2.5 mg total) by nebulization every 6 (six) hours as needed for wheezing or shortness of breath. 07/10/21   Hunsucker, Bonna Gains, MD  allopurinol (ZYLOPRIM) 100 MG tablet Take 100 mg by mouth every  evening.    [provider]  aspirin EC 81 MG tablet Take 1 tablet (81 mg total) by mouth daily. Swallow whole. 10/15/21   Bhagat, Crista Luria, PA  calcium acetate (PHOSLO) 667 MG capsule Take 1,334 mg by mouth See admin instructions. Take 1,334 by mouth twice a day when able to remember. 05/13/21   [provider]  carboxymethylcellulose 1 % ophthalmic solution Place 2 drops into both eyes 2 (two) times daily as needed (dry eyes).    [provider]  cetirizine (ZYRTEC) 10  MG tablet Take 10 mg by mouth daily as needed for allergies.    [provider]  cholecalciferol (VITAMIN D) 25 MCG tablet Take 1 tablet (1,000 Units total) by mouth every evening. 10/04/20   Gold, Wilder Glade, PA-C  cholecalciferol (VITAMIN D3) 10 MCG (400 UNIT) TABS tablet Take 800 Units by mouth daily.    [provider]  cinacalcet (SENSIPAR) 60 MG tablet Take 60 mg by mouth daily.    [provider]  cyclobenzaprine (FLEXERIL) 10 MG tablet Take 10 mg by mouth 2 (two) times daily as needed for muscle spasms.    [provider]  Darbepoetin Alfa (ARANESP) 200 MCG/0.4ML SOSY injection Inject 0.4 mLs (200 mcg total) into the vein every Wednesday with hemodialysis. 10/10/20   Gold, Patrick Jupiter E, PA-C  Diclofenac Sodium (VOLTAREN EX) Apply 1 application. topically daily as needed (pain).    [provider]  docusate sodium (COLACE) 100 MG capsule Take 100-200 mg by mouth daily as needed for mild constipation.    [provider]  ferrous sulfate 325 (65 FE) MG tablet Take 325 mg by mouth See admin instructions. Take 325 mg by mouth three times a week    [provider]  folic acid (FOLVITE) 761 MCG tablet Take 400 mcg by mouth daily.    [provider]  gabapentin (NEURONTIN) 300 MG capsule Take 1 capsule (300 mg total) by mouth at bedtime as needed (Neuropathic pain). Patient taking differently: Take 300 mg by mouth daily as needed (Neuropathic  pain). 12/14/17 07/16/21  Dana Allan I, MD  heparin 1000 unit/mL SOLN injection 3 mLs (3,000 Units total) by Dialysis route as needed (in dialysis as needed). 10/04/20   Jadene Pierini E, PA-C  heparin 1000 unit/mL SOLN injection 2 mLs (2,000 Units total) by Dialysis route as needed (in dialysis as needed). 10/04/20   Jadene Pierini E, PA-C  heparin 1000 unit/mL SOLN injection 1 mL (1,000 Units total) by Dialysis route as needed (in dialysis). 10/04/20   Jadene Pierini E, PA-C  hydrALAZINE (APRESOLINE) 100 MG tablet Take 1 tablet (100 mg total) by mouth every 8 (eight) hours. Patient taking differently: Take 100 mg by mouth 3 (three) times daily. 07/06/20   Cresenzo, Angelyn Punt, MD  hydrocortisone (ANUSOL-HC) 2.5 % rectal cream Place rectally 2 (two) times daily. 07/23/21   [name], MD  isosorbide mononitrate (IMDUR) 60 MG 24 hr tablet Take 1 tablet (60 mg total) by mouth daily. 08/14/21   Bhagat, Crista Luria, PA  labetalol (NORMODYNE) 200 MG tablet Take 2 tablets (400 mg total) by mouth 3 (three) times daily. 07/11/21   Bhagat, Bhavinkumar, PA  lidocaine (LIDODERM) 5 % Place 1 patch onto the skin every 12 (twelve) hours as needed. Remove & Discard patch within 12 hours or as directed by MD 07/23/21   [name], MD  LORazepam (ATIVAN) 1 MG tablet Take 1 mg by mouth every 8 (eight) hours as needed for anxiety. 06/25/20   [provider]  montelukast (SINGULAIR) 10 MG tablet Take 10 mg by mouth daily with supper.     [provider]  NIFEdipine (PROCARDIA XL/NIFEDICAL XL) 60 MG 24 hr tablet Take 60 mg by mouth 2 (two) times daily. 10/01/21   [provider]  olmesartan (BENICAR) 40 MG tablet Take 40 mg by mouth every evening. 05/06/19   [provider]  ondansetron (ZOFRAN) 4 MG tablet Take 4 mg by mouth 2 (two) times daily as needed for nausea or vomiting.  [provider]  OVER THE COUNTER MEDICATION Take by mouth daily as needed (sickness). Elderberry Fruit and flower 1  dropper full by mouth daily as needed for sickness    [provider]  Oxycodone HCl 10 MG TABS Take 10 mg by mouth daily as needed for pain. 01/11/21   [provider]  OXYGEN Inhale 2 L into the lungs as directed.    [provider]  pantoprazole (PROTONIX) 40 MG tablet Take 40 mg by mouth daily. 06/09/20   [provider]  polyethylene glycol powder (GLYCOLAX/MIRALAX) 17 GM/SCOOP powder Mix 17 grams into 8 ounces of liquid and drink by mouth daily. Patient taking differently: Take 17 g by mouth daily as needed (constipation). 03/19/21   Carollee Leitz, MD  predniSONE (DELTASONE) 5 MG tablet Take 4 tablets (20 mg total) by mouth daily with breakfast for 2 days, THEN 1 tablet (5 mg total) daily with breakfast. Patient taking differently: Take 1 tablet (5 mg total) daily with breakfast. 06/29/21 12/28/21  Lacinda Axon, MD  Probiotic Product (PROBIOTIC PO) Take 1 capsule by mouth daily.    [provider]  sorbitol 70 % SOLN Take 30 ml 1-2 times daily as needed for constipation 07/23/21   [name], MD  Tiotropium Bromide Monohydrate (SPIRIVA RESPIMAT) 2.5 MCG/ACT AERS Inhale 2 puffs into the lungs 2 (two) times daily as needed (wheezing/SOB). 07/10/21   Hunsucker, Bonna Gains, MD  VENTOLIN HFA 108 (90 Base) MCG/ACT inhaler Inhale 2 puffs into the lungs See admin instructions. Take 2 puffs every 4-6 hours as needed for wheezing and shortness of breath 07/10/21   Hunsucker, Bonna Gains, MD  zolpidem (AMBIEN) 5 MG tablet Take 5 mg by mouth at bedtime as needed for sleep. 02/04/21   [provider]    Physical Exam: Vitals:   10/20/21 1834 10/20/21 1900 10/20/21 2015 10/20/21 2142  BP: (!) 170/65 (!) 168/74 (!) 190/66 (!) 182/79  Pulse: 65 65 64 70  Resp: $Remo'17 17 19 19  'hicgh$ Temp:   98.5 F (36.9 C) 98.3 F (36.8 C)  TempSrc:   Oral   SpO2: 100% 99% 97% 100%  Weight:    60.7 kg  Height:    '5\' 2"'$  (1.575 m)    Physical Exam Constitutional:       General: She is not in acute distress.    Appearance: Normal appearance.  HENT:     Head: Normocephalic and atraumatic.     Mouth/Throat:     Mouth: Mucous membranes are moist.     Pharynx: Oropharynx is clear.  Eyes:     Extraocular Movements: Extraocular movements intact.     Pupils: Pupils are equal, round, and reactive to light.  Cardiovascular:     Rate and Rhythm: Normal rate and regular rhythm.     Pulses: Normal pulses.     Heart sounds: Normal heart sounds.  Pulmonary:     Effort: Pulmonary effort is normal. No respiratory distress.     Breath sounds: Rales present. No wheezing.  Abdominal:     General: Bowel sounds are normal. There is no distension.     Palpations: Abdomen is soft.     Tenderness: There is no abdominal tenderness.  Musculoskeletal:        General: No swelling or deformity.     Right lower leg: No edema.     Left lower leg: No edema.  Skin:    General: Skin is warm and dry.  Neurological:  General: No focal deficit present.     Mental Status: Mental status is at baseline.    Labs on Admission: I have personally reviewed following labs and imaging studies  CBC: Recent Labs  Lab 10/20/21 1609 10/20/21 1738  WBC 5.8  --   NEUTROABS 3.2  --   HGB 9.2* 9.9*  HCT 28.4* 29.0*  MCV 104.8*  --   PLT 228  --     Basic Metabolic Panel: Recent Labs  Lab 10/20/21 1609 10/20/21 1738  NA 133* 132*  K 3.6 4.0  CL 95*  --   CO2 27  --   GLUCOSE 105*  --   BUN 22  --   CREATININE 6.57*  --   CALCIUM 8.7*  --     GFR: Estimated Creatinine Clearance: 7.7 mL/min (A) (by C-G formula based on SCr of 6.57 mg/dL (H)).  Liver Function Tests: Recent Labs  Lab 10/20/21 1609  AST 17  ALT 11  ALKPHOS 260*  BILITOT 0.7  PROT 7.0  ALBUMIN 3.3*    Urine analysis:    Component Value Date/Time   COLORURINE YELLOW 09/12/2020 2333   APPEARANCEUR HAZY (A) 09/12/2020 2333   LABSPEC 1.012 09/12/2020 2333   PHURINE 8.0 09/12/2020 2333    GLUCOSEU NEGATIVE 09/12/2020 2333   HGBUR SMALL (A) 09/12/2020 2333   BILIRUBINUR NEGATIVE 09/12/2020 2333   KETONESUR NEGATIVE 09/12/2020 2333   PROTEINUR 100 (A) 09/12/2020 2333   UROBILINOGEN 0.2 09/05/2014 0013   NITRITE NEGATIVE 09/12/2020 2333   LEUKOCYTESUR NEGATIVE 09/12/2020 2333    Radiological Exams on Admission: CT Angio Chest PE W/Cm &/Or Wo Cm  Result Date: 10/20/2021 CLINICAL DATA:  Pulmonary embolism suspected. Unknown D-dimer. Productive cough, intermittent diarrhea and nausea for 2 weeks. EXAM: CT ANGIOGRAPHY CHEST WITH CONTRAST TECHNIQUE: Multidetector CT imaging of the chest was performed using the standard protocol during bolus administration of intravenous contrast. Multiplanar CT image reconstructions and MIPs were obtained to evaluate the vascular anatomy. RADIATION DOSE REDUCTION: This exam was performed according to the departmental dose-optimization program which includes automated exposure control, adjustment of the mA and/or kV according to patient size and/or use of iterative reconstruction technique. CONTRAST:  58mL OMNIPAQUE IOHEXOL 350 MG/ML SOLN COMPARISON:  Chest CT dated 08/27/2021. chest CT dated 09/23/2020. FINDINGS: Cardiovascular: Some of the most peripheral segmental and subsegmental pulmonary artery branches can not be definitively characterized due to considerable patient breathing motion artifact, however, there is no pulmonary embolism identified within the main, lobar or central segmental pulmonary arteries bilaterally. Cardiomegaly. No substantial pericardial effusion. No thoracic aortic aneurysm or evidence of aortic dissection. Three-vessel coronary artery calcifications. Surgical changes of median sternotomy and CABG. Mediastinum/Nodes: Conglomerate lymphadenopathy within the subcarinal space of the mediastinum, significantly increased compared to chest CT of 09/23/2020 and similar in appearance compared to more recent chest CT of 01/02/2021. Numerous  additional small and moderately enlarged lymph nodes throughout the remainder of the mediastinum. Esophagus is unremarkable, where seen. Trachea and central bronchi are unremarkable. Lungs/Pleura: Patchy consolidations within the RIGHT lower lobe. Mild atelectasis at the LEFT lung base. Small RIGHT pleural effusion. No pneumothorax. Upper Abdomen: No acute findings. Musculoskeletal: No acute-appearing osseous abnormality. Review of the MIP images confirms the above findings. IMPRESSION: 1. Patchy consolidations within the RIGHT lower lobe, likely pneumonia. 2. Conglomerate lymphadenopathy within the subcarinal space of the mediastinum, significantly increased compared to chest CT of 09/23/2020 and similar in appearance compared to more recent chest CT of 01/02/2021, differential considerations including benign  reactive lymphadenopathy, metastatic lymphadenopathy and lymphoma. 3. No pulmonary embolism seen, with mild study limitations detailed above. 4. Marked cardiomegaly. Three-vessel coronary artery calcifications. Surgical changes of median sternotomy and CABG. 5. Small RIGHT pleural effusion. Electronically Signed   By: Bary Richard M.D.   On: 10/20/2021 18:12   DG Chest 2 View  Result Date: 10/20/2021 CLINICAL DATA:  Cough 2 weeks with shortness of breath.  Hypoxia. EXAM: CHEST - 2 VIEW COMPARISON:  10/02/2021 FINDINGS: Lungs are adequately inflated with persistent hazy perihilar opacification compatible with mild vascular congestion/edema. No significant effusion. Moderate stable cardiomegaly. Remainder of the exam is unchanged. IMPRESSION: Moderate stable cardiomegaly with findings suggesting mild vascular congestion/edema. Electronically Signed   By: Elberta Fortis M.D.   On: 10/20/2021 16:31    EKG: Independently reviewed.  Normal sinus rhythm at 66 bpm.  LVH.  Nonspecific T wave flattening.  Assessment/Plan Principal Problem:   Acute on chronic respiratory failure with hypoxia (HCC) Active  Problems:   Diabetes mellitus with coincident hypertension (HCC)   Essential hypertension   GERD (gastroesophageal reflux disease)   Anemia   Asthma   ESRD (end stage renal disease) (HCC)   OSA (obstructive sleep apnea)   S/P CABG x 2   Chronic diastolic CHF (congestive heart failure) (HCC)   Coronary artery disease involving coronary bypass graft of native heart with angina pectoris (HCC)   Paroxysmal atrial fibrillation (HCC)   Volume overload   Acute on chronic respite failure with hypoxia ?Pneumonia ?Asthma exacerbation Volume overload Chronic diastolic CHF > Patient presenting with worsening shortness of breath found to be requiring 4 L increased from home 2 L.  Noted to have wheezing and rales on initial exam in ED.  For me on exam she has rales but no wheezes, no lower extremity edema.  Imaging showed possible pneumonia and lab work-up with elevated BNP also acute.  Be consistent with volume overload in setting of ESRD.  A missing 2 dialysis sessions last week but not her most recent 1. > Start infection above possible viral pneumonia leading to asthma exacerbation in combination with degree of volume overload leading to acute on chronic respiratory failure.  Would favor viral process for pneumonia given there is a lack of leukocytosis. > Negative COVID and flu screening in the ED.  Will check for viral panel. > Patient volume is managed with dialysis.  Last echo was earlier this year with EF 65-70%, grade 2 diastolic dysfunction, RV mildly reduced. - Monitor on telemetry - Hold off on further antibiotics - Check full respiratory viral panel - Continue with daily prednisone - Continue as needed DuoNebs - Continue home Spiriva, Singulair, as needed albuterol for asthma - Continue home olmesartan and labetalol in the setting of CHF - Strict I's and O's, daily weights - Continue with volume management with dialysis  Hypertension > History of poorly controlled and resistant  hypertension - Continue home olmesartan, hydralazine, Imdur, nifedipine, labetalol - Continue home clonidine  ESRD on HD - Nephrology consulted in the ED, appreciate recommendations  GERD - Continue home PPI  CAD > status post CABG - Continue home olmesartan, labetalol - Continue home aspirin  Anemia > Hemoglobin stable at 9.2 in the ED - Continue to trend CBC  OSA - History of CPAP noncompliance  Diabetes - SSI  DVT prophylaxis: Heparin Code Status:   Full Family Communication:  None on Admission  Disposition Plan:   Patient is from:  Home  Anticipated DC to:  Home  Anticipated DC date:  1 to 3 days  Anticipated DC barriers: None  Consults called:  Nephrology consulted in the ED and are following for dialysis needs Admission status:  Inpatient, telemetry  Severity of Illness: The appropriate patient status for this patient is INPATIENT. Inpatient status is judged to be reasonable and necessary in order to provide the required intensity of service to ensure the patient's safety. The patient's presenting symptoms, physical exam findings, and initial radiographic and laboratory data in the context of their chronic comorbidities is felt to place them at high risk for further clinical deterioration. Furthermore, it is not anticipated that the patient will be medically stable for discharge from the hospital within 2 midnights of admission.   * I certify that at the point of admission it is my clinical judgment that the patient will require inpatient hospital care spanning beyond 2 midnights from the point of admission due to high intensity of service, high risk for further deterioration and high frequency of surveillance required.Marcelyn Bruins MD [facility]  How to contact the [facility] Attending or Consulting provider [place] or covering provider during after hours [place], for this patient?   Check the care team in [facility] and look for a) attending/consulting TRH  provider listed and b) the [facility] team listed Log into [email or web address] and use [place]'s universal password to access. If you do not have the password, please contact the hospital operator. Locate the [facility] provider you are looking for under [facility] and page to a number that you can be directly reached. If you still have difficulty reaching the provider, please page the [facility] (Director on Call) for the Hospitalists listed on amion for assistance.  10/20/2021, 10:11 PM

## 2021-10-20 NOTE — Progress Notes (Signed)
Pt's IV had a small infiltration during CT contrast injection, estimated 10-26ml, causing raised non painful area around IV insertion site about 1 inch. IV removed, heat applied, Rn notified.

## 2021-10-20 NOTE — Plan of Care (Signed)

## 2021-10-20 NOTE — ED Notes (Signed)
Rn tired to start iv on patient . Rn was unsuccessful

## 2021-10-20 NOTE — Progress Notes (Signed)
Plan of Care Note for accepted transfer   Patient: Jodi Bell MRN: [id number]   [place]: 10/20/2021  Facility requesting transfer: [facility] Requesting Provider: Garnette Gunner, MD Reason for transfer: Acute on chronic hypoxia  Facility course: 61 yo F with COPD with chronic respiratory failure on 2L O2, ESRD on HD, paroxysmal a.fib, CHF, T2DM, OSA, presents with 2 weeks of productive cough, nausea, vomiting and shortness of breath.   She is afebrile and requiring 4L O2. CTA chest with right sided pneumonia.  Negative flu and COVID PCR. BNP >4500. Creatinine 6.5. Na 133. K 3.6. Reportedly has not been to dialysis since she has been feeling sick. EDP discussed with nephrology Dr. Jonnie Finner who will take her for dialysis tomorrow.   Plan of care: The patient is accepted for admission to Telemetry unit, at [place] to continue care of patient while she remains in the ED.  Author: Orene Desanctis, DO 10/20/2021  Check [email or web address] for on-call coverage.  Nursing staff, Please call [place] number on Amion as soon as patient's arrival, so appropriate admitting provider can evaluate the pt.

## 2021-10-21 ENCOUNTER — Telehealth (HOSPITAL_COMMUNITY): Payer: Self-pay

## 2021-10-21 DIAGNOSIS — J9621 Acute and chronic respiratory failure with hypoxia: Secondary | ICD-10-CM | POA: Diagnosis not present

## 2021-10-21 LAB — RESPIRATORY PANEL BY PCR

## 2021-10-21 LAB — CBC
HCT: 24.9 % — ABNORMAL LOW (ref 36.0–46.0)
Hemoglobin: 8.5 g/dL — ABNORMAL LOW (ref 12.0–15.0)
MCH: 35.7 pg — ABNORMAL HIGH (ref 26.0–34.0)
MCHC: 34.1 g/dL (ref 30.0–36.0)
MCV: 104.6 fL — ABNORMAL HIGH (ref 80.0–100.0)
Platelets: 207 10*3/uL (ref 150–400)
RBC: 2.38 MIL/uL — ABNORMAL LOW (ref 3.87–5.11)
RDW: 14.4 % (ref 11.5–15.5)
WBC: 4.1 10*3/uL (ref 4.0–10.5)
nRBC: 0 % (ref 0.0–0.2)

## 2021-10-21 LAB — GLUCOSE, CAPILLARY
Glucose-Capillary: 165 mg/dL — ABNORMAL HIGH (ref 70–99)
Glucose-Capillary: 110 mg/dL — ABNORMAL HIGH (ref 70–99)
Glucose-Capillary: 127 mg/dL — ABNORMAL HIGH (ref 70–99)
Glucose-Capillary: 138 mg/dL — ABNORMAL HIGH (ref 70–99)

## 2021-10-21 LAB — RENAL FUNCTION PANEL
Albumin: 2.9 g/dL — ABNORMAL LOW (ref 3.5–5.0)
Anion gap: 13 (ref 5–15)
BUN: 25 mg/dL — ABNORMAL HIGH (ref 8–23)
CO2: 23 mmol/L (ref 22–32)
Calcium: 9.2 mg/dL (ref 8.9–10.3)
Chloride: 95 mmol/L — ABNORMAL LOW (ref 98–111)
Creatinine, Ser: 7.61 mg/dL — ABNORMAL HIGH (ref 0.44–1.00)
GFR, Estimated: 6 mL/min — ABNORMAL LOW (ref >60)
Glucose, Bld: 193 mg/dL — ABNORMAL HIGH (ref 70–99)
Phosphorus: 4.2 mg/dL (ref 2.5–4.6)
Potassium: 4.1 mmol/L (ref 3.5–5.1)
Sodium: 131 mmol/L — ABNORMAL LOW (ref 135–145)

## 2021-10-21 LAB — HEPATITIS B SURFACE ANTIGEN: Hepatitis B Surface Ag: NONREACTIVE

## 2021-10-21 MED ORDER — PROSOURCE PLUS PO LIQD
30.0000 mL | Freq: Two times a day (BID) | ORAL | Status: DC
  Administered 2021-10-21 – 2021-10-25 (×8): 30 mL via ORAL
  Filled 2021-10-21 (×7): qty 30

## 2021-10-21 MED ORDER — OXYCODONE HCL 5 MG PO TABS: 10.0000 mg | ORAL_TABLET | Freq: Four times a day (QID) | ORAL | Status: DC | PRN

## 2021-10-21 MED ORDER — LIDOCAINE 5 % EX PTCH
1.0000 | MEDICATED_PATCH | CUTANEOUS | Status: DC
  Administered 2021-10-21 – 2021-10-24 (×4): 1 via TRANSDERMAL
  Filled 2021-10-21 (×4): qty 1

## 2021-10-21 MED ORDER — OXYCODONE HCL 5 MG PO TABS
10.0000 mg | ORAL_TABLET | Freq: Three times a day (TID) | ORAL | Status: DC | PRN
  Administered 2021-10-23: 10 mg via ORAL
  Filled 2021-10-21 (×3): qty 2

## 2021-10-21 MED ORDER — DARBEPOETIN ALFA 200 MCG/0.4ML IJ SOSY
200.0000 ug | PREFILLED_SYRINGE | INTRAMUSCULAR | Status: DC
  Administered 2021-10-21: 200 ug via INTRAVENOUS
  Filled 2021-10-21 (×2): qty 0.4

## 2021-10-21 MED ORDER — GUAIFENESIN 100 MG/5ML PO LIQD
10.0000 mL | ORAL | Status: DC | PRN
  Administered 2021-10-21 – 2021-10-24 (×7): 10 mL via ORAL
  Filled 2021-10-21 (×7): qty 15

## 2021-10-21 MED ORDER — IPRATROPIUM-ALBUTEROL 0.5-2.5 (3) MG/3ML IN SOLN: 3.0000 mL | Freq: Four times a day (QID) | RESPIRATORY_TRACT | Status: DC

## 2021-10-21 MED ORDER — BENZONATATE 100 MG PO CAPS
100.0000 mg | ORAL_CAPSULE | Freq: Two times a day (BID) | ORAL | Status: DC | PRN
  Administered 2021-10-21 – 2021-10-23 (×4): 100 mg via ORAL
  Filled 2021-10-21 (×5): qty 1

## 2021-10-21 NOTE — TOC Initial Note (Addendum)
Transition of Care [facility]) - Initial/Assessment Note    Patient Details  Name: Jodi Bell MRN: [id number] Date of Birth: 1960/03/06  Transition of Care [facility]) CM/SW Contact:    Tom-Johnson, Renea Ee, RN Phone Number: 10/21/2021, 2:12 PM  Clinical Narrative:                  CM spoke with patient at bedside about needs for post hospital transition. Admitted for Acute on Chronic Respiratory Failure with Hypoxia, on 3L O2 chronic. Gets O2 supplies from [employer].  From home with daughter and son in-law. Has three supportive children.  Has Kasandra Knudsen, RW rollator, shower seat/BSC at home.  On disability. Has scheduled outpatient Dialysis on MWF, states she drives self to and from her appointments.  PCP is Rozetta Nunnery, Cleone Slim., MD [facility] [address] in [place].  No TOC needs or recommendation noted at this time. CM will continue to follow as patient progresses towards discharge.     Expected Discharge Plan: Home/Self Care Barriers to Discharge: Continued Medical Work up   Patient Goals and CMS Choice Patient states their goals for this hospitalization and ongoing recovery are:: To return home CMS Medicare.gov Compare Post Acute Care list provided to:: Patient Choice offered to / list presented to : NA  Expected Discharge Plan and Services Expected Discharge Plan: Home/Self Care   Discharge Planning Services: CM Consult Post Acute Care Choice: NA Living arrangements for the past 2 months: Single Family Home                 DME Arranged: N/A DME Agency: NA       HH Arranged: NA HH Agency: NA        Prior Living Arrangements/Services Living arrangements for the past 2 months: Single Family Home Lives with:: Adult Children Patient language and need for interpreter reviewed:: Yes Do you feel safe going back to the place where you live?: Yes      Need for Family Participation in Patient Care: Yes (Comment) Care giver support system in place?: Yes  (comment) Current home services:  (Home O2 at 3L, cane, walker, showerseat.) Criminal Activity/Legal Involvement Pertinent to Current Situation/Hospitalization: No - Comment as needed  Activities of Daily Living Home Assistive Devices/Equipment: Walker (specify type) ADL Screening (condition at time of admission) Patient's cognitive ability adequate to safely complete daily activities?: Yes Is the patient deaf or have difficulty hearing?: No Does the patient have difficulty seeing, even when wearing glasses/contacts?: No Does the patient have difficulty concentrating, remembering, or making decisions?: No Patient able to express need for assistance with ADLs?: Yes Does the patient have difficulty dressing or bathing?: No Independently performs ADLs?: Yes (appropriate for developmental age) Does the patient have difficulty walking or climbing stairs?: Yes Weakness of Legs: Both Weakness of Arms/Hands: Both  Permission Sought/Granted Permission sought to share information with : Case Manager, Family Supports Permission granted to share information with : Yes, Verbal Permission Granted              Emotional Assessment Appearance:: Appears stated age Attitude/Demeanor/Rapport: Engaged, Gracious Affect (typically observed): Accepting, Appropriate, Calm, Hopeful, Pleasant Orientation: : Oriented to Self, Oriented to Place, Oriented to  Time, Oriented to Situation Alcohol / Substance Use: Not Applicable Psych Involvement: No (comment)  Admission diagnosis:  Acute on chronic respiratory failure with hypoxia (HCC) [J96.21] Patient Active Problem List   Diagnosis Date Noted   Acute on chronic respiratory failure with hypoxia ([place]) 10/20/2021  Back pain 07/18/2021   Adenovirus infection, unspecified    Volume overload 06/26/2021   COVID-19 02/13/2021   Coronary artery disease involving coronary bypass graft of native heart with angina pectoris ([place]) 01/10/2021   Paroxysmal atrial  fibrillation ([place]) 01/10/2021   Aortic stenosis 01/10/2021   Mitral stenosis 01/10/2021   Chronic diastolic CHF (congestive heart failure) ([place]) 01/02/2021   Pressure injury of skin 09/21/2020   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/13/2020   GI bleeding 05/17/2019   Intervertebral cervical disc disorder with myelopathy, cervical region 09/14/2018   OSA (obstructive sleep apnea) 09/14/2018   Asthma 05/16/2018   ESRD (end stage renal disease) ([place]) 05/16/2018   Status post left partial knee replacement 11/12/2017   GERD (gastroesophageal reflux disease) 09/20/2017   Tobacco use 09/20/2017   Anemia 09/20/2017   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 [place] [address] Phone: [phone] Fax: [id number] - [place] - [address] [place] AT [place] [address] [place] [address] Phone: [phone] Fax: [phone]  [id number] [address], [place] [address] [place] [address] Phone: [phone] Fax: [phone]     Social Determinants of Health ([place]) Interventions    Readmission Risk Interventions    07/23/2021    3:40 PM 06/28/2021    5:10 PM  Readmission Risk Prevention Plan  Transportation Screening Complete Complete  Medication Review Press photographer) Complete Complete  PCP or Specialist appointment within 3-5 days of discharge Complete   HRI or [place] Complete Complete  SW Recovery Care/Counseling Consult Complete   Palliative Care Screening Not Applicable Not  [place] Not Applicable Not Applicable

## 2021-10-21 NOTE — Plan of Care (Signed)
  Problem: Clinical Measurements: Goal: Respiratory complications will improve Outcome: Progressing Goal: Cardiovascular complication will be avoided Outcome: Progressing   Problem: Activity: Goal: Risk for activity intolerance will decrease Outcome: Progressing   Problem: Nutrition: Goal: Adequate nutrition will be maintained Outcome: Progressing   Problem: Coping: Goal: Level of anxiety will decrease Outcome: Progressing   

## 2021-10-21 NOTE — Progress Notes (Signed)
Mobility Specialist Progress Note:   10/21/21 1333  Mobility  Activity Off unit   Pt off unit at time of visit. Will f/u as needed.    Rodric Punch Mobility Specialist-Acute Rehab Secure Chat only

## 2021-10-21 NOTE — Progress Notes (Signed)
Received patient in bed to unit.  Alert and oriented.  Informed consent signed and in chart.   Treatment initiated: 1356 Treatment completed: 0029  Patient tolerated well.  Transported back to the room  Alert, without acute distress.  Hand-off given to patient's nurse.   Access used: AVF Access issues: NA  Total UF removed: 3575ml Medication(s) given: Aranesp 273mcg IVP, Robitussin 100mg /28ml 39ml po Post HD VS: 99.5     167/62    71    16    100% 2LNC Post HD weight: 56.4kg   [facility]

## 2021-10-21 NOTE — Progress Notes (Signed)
PROGRESS NOTE    Jodi Bell  ZOX:[id number] DOB: 13-Oct-1960 DOA: 10/20/2021 PCP: [name]., MD   Brief Narrative:  This 61 years old female with PMH significant for diabetes, hypertension, ESRD on hemodialysis, OSA, atrial fibrillation, GERD, asthma, anemia, CAD s/p CABG, CHF presented with worsening shortness of breath.  Patient reports gradually worsening shortness of breath for last 2 weeks which has been little worse the last few days.  She uses 2 L of supplemental oxygen at baseline.  She has developed cough and concerned that she may have picked up infection from her granddaughter from school.  She did complete her last dialysis on Friday however she states she has missed 2 dialysis sessions last week due to vomiting and diarrhea which is improving.  Pertinent labs in the ED BNP more than 4500, COVID negative.  CTA chest ruled out PE but did show patchy consolidation of the right lower lobe suspicious for pneumonia.  Patient also found to have lymphadenopathy increased from previous but consistent with reactive.  Patient is admitted for acute on chronic hypoxic respiratory failure could be multifactorial.  Nephrology is consulted for continuation of hemodialysis.  Assessment & Plan:   Principal Problem:   Acute on chronic respiratory failure with hypoxia (HCC) Active Problems:   Diabetes mellitus with coincident hypertension (HCC)   Essential hypertension   GERD (gastroesophageal reflux disease)   Anemia   Asthma   ESRD (end stage renal disease) (HCC)   OSA (obstructive sleep apnea)   S/P CABG x 2   Chronic diastolic CHF (congestive heart failure) (HCC)   Coronary artery disease involving coronary bypass graft of native heart with angina pectoris (HCC)   Paroxysmal atrial fibrillation (HCC)   Volume overload  Acute on chronic hypoxic respiratory failure likely multifactorial. Patient with chronic diastolic CHF, presented with worsening shortness of breath, increased oxygen  requirement from baseline, increased pedal edema.   Continue supplemental oxygen and wean as tolerated.  CTA chest ruled out PE but shows right lower lobe pneumonia. Continue empiric antibiotics ceftriaxone and Zithromax. COVID-negative, influenza negative. Patient reports she has missed 2 dialysis sessions last week due to nausea vomiting and diarrhea.   Nephrology is consulted for hemodialysis. Patient volume is being managed with dialysis. Last echocardiogram showed LVEF 65 to 70%. Continue with daily prednisone and as needed DuoNeb. Continue home Spiriva, Singulair and albuterol. Continue olmesartan and labetalol in the setting of CHF. Monitor daily intake, intake output charting.  Community-acquired pneumonia: Continue IV ceftriaxone and Zithromax. Follow up Procalcitonin.   Essential hypertension: Blood pressure remains uncontrolled. Continue olmesartan, hydralazine, Imdur, nifedipine and labetalol. Continue home clonidine dosing.  ESRD: Continue hemodialysis as per schedule.   Nephrology is consulted  GERD: Continue pantoprazole  CAD: S/p CABG.   Continue home olmesartan, labetalol and aspirin  Macrocytic anemia: No obvious visible bleeding.  Monitor H&H.   Transfuse if H&H below 7  OSA: Continue CPAP. She been noncompliant  Diabetes mellitus: Carb modified diet, hemoglobin A1c, Continue regular insulin sliding scale    DVT prophylaxis: Heparin Code Status: Full code Family Communication: No family at bedside Disposition Plan: Status is: Inpatient Remains inpatient appropriate because: Admitted for acute on chronic hypoxic respiratory failure multifactorial requiring IV antibiotics and hemodialysis.   Consultants:  Nephrology  Procedures: CTA chest Antimicrobials:  Anti-infectives (From admission, onward)    Start     Dose/Rate Route Frequency Ordered Stop   10/20/21 1845  cefTRIAXone (ROCEPHIN) 1 g in sodium chloride 0.9 % 100  mL IVPB        1  g 200 mL/hr over 30 Minutes Intravenous Every 24 hours 10/20/21 1830     10/20/21 1845  azithromycin (ZITHROMAX) 500 mg in sodium chloride 0.9 % 250 mL IVPB        500 mg 250 mL/hr over 60 Minutes Intravenous Every 24 hours 10/20/21 1830          Subjective: Patient was seen and examined at bedside.  Overnight events noted.   Patient reports still having cough and shortness of breath.  She is going to have dialysis today.  Objective: Vitals:   10/21/21 0505 10/21/21 0700 10/21/21 0728 10/21/21 0954  BP: (!) 206/69 (!) 197/75 (!) 209/69 (!) 216/80  Pulse: 65 71 65 63  Resp: 18 17 18 19   Temp: 98.4 F (36.9 C)  98.2 F (36.8 C) 98.9 F (37.2 C)  TempSrc:    Oral  SpO2: (!) 89% 91% 100% 99%  Weight:      Height:        Intake/Output Summary (Last 24 hours) at 10/21/2021 1112 Last data filed at 10/21/2021 0630 Gross per 24 hour  Intake 347.15 ml  Output 0 ml  Net 347.15 ml   Filed Weights   10/20/21 2142  Weight: 60.7 kg    Examination:  General exam: Appears comfortable, not in any acute distress, deconditioned. Respiratory system: Bibasilar crackles+, respiratory effort normal, RR 17 Cardiovascular system: S1 & S2 heard, regular rate and rhythm, no murmur. Gastrointestinal system: Abdomen is soft, non tender, non distended, BS+ Central nervous system: Alert and oriented x 3. No focal neurological deficits. Extremities: Edema+ no cyanosis, no clubbing Skin: No rashes, lesions or ulcers Psychiatry: Judgement and insight appear normal. Mood & affect appropriate.     Data Reviewed: I have personally reviewed following labs and imaging studies  CBC: Recent Labs  Lab 10/20/21 1609 10/20/21 1738 10/21/21 0628  WBC 5.8  --  4.1  NEUTROABS 3.2  --   --   HGB 9.2* 9.9* 8.5*  HCT 28.4* 29.0* 24.9*  MCV 104.8*  --  104.6*  PLT 228  --  585   Basic Metabolic Panel: Recent Labs  Lab 10/20/21 1609 10/20/21 1738 10/21/21 0628  NA 133* 132* 131*  K 3.6 4.0 4.1   CL 95*  --  95*  CO2 27  --  23  GLUCOSE 105*  --  193*  BUN 22  --  25*  CREATININE 6.57*  --  7.61*  CALCIUM 8.7*  --  9.2  PHOS  --   --  4.2   GFR: Estimated Creatinine Clearance: 6.7 mL/min (A) (by C-G formula based on SCr of 7.61 mg/dL (H)). Liver Function Tests: Recent Labs  Lab 10/20/21 1609 10/21/21 0628  AST 17  --   ALT 11  --   ALKPHOS 260*  --   BILITOT 0.7  --   PROT 7.0  --   ALBUMIN 3.3* 2.9*   No results for input(s): "LIPASE", "AMYLASE" in the last 168 hours. No results for input(s): "AMMONIA" in the last 168 hours. Coagulation Profile: Recent Labs  Lab 10/20/21 1609  INR 1.2   Cardiac Enzymes: No results for input(s): "CKTOTAL", "CKMB", "CKMBINDEX", "TROPONINI" in the last 168 hours. BNP (last 3 results) No results for input(s): "PROBNP" in the last 8760 hours. HbA1C: No results for input(s): "HGBA1C" in the last 72 hours. CBG: Recent Labs  Lab 10/21/21 0722  GLUCAP 165*   Lipid Profile:  No results for input(s): "CHOL", "HDL", "LDLCALC", "TRIG", "CHOLHDL", "LDLDIRECT" in the last 72 hours. Thyroid Function Tests: No results for input(s): "TSH", "T4TOTAL", "FREET4", "T3FREE", "THYROIDAB" in the last 72 hours. Anemia Panel: No results for input(s): "VITAMINB12", "FOLATE", "FERRITIN", "TIBC", "IRON", "RETICCTPCT" in the last 72 hours. Sepsis Labs: No results for input(s): "PROCALCITON", "LATICACIDVEN" in the last 168 hours.  Recent Results (from the past 240 hour(s))  Resp Panel by RT-PCR (Flu A&B, Covid) Anterior Nasal Swab     Status: None   Collection Time: 10/20/21  4:00 PM   Specimen: Anterior Nasal Swab  Result Value Ref Range Status   SARS Coronavirus 2 by RT PCR NEGATIVE NEGATIVE Final    Comment: (NOTE) SARS-CoV-2 target nucleic acids are NOT DETECTED.  The SARS-CoV-2 RNA is generally detectable in upper respiratory specimens during the acute phase of infection. The lowest concentration of SARS-CoV-2 viral copies this assay can  detect is 138 copies/mL. A negative result does not preclude SARS-Cov-2 infection and should not be used as the sole basis for treatment or other patient management decisions. A negative result may occur with  improper specimen collection/handling, submission of specimen other than nasopharyngeal swab, presence of viral mutation(s) within the areas targeted by this assay, and inadequate number of viral copies(<138 copies/mL). A negative result must be combined with clinical observations, patient history, and epidemiological information. The expected result is Negative.  Fact Sheet for Patients:  EntrepreneurPulse.com.au  Fact Sheet for Healthcare Providers:  IncredibleEmployment.be  This test is no t yet approved or cleared by the [place] FDA and  has been authorized for detection and/or diagnosis of SARS-CoV-2 by FDA under an Emergency Use Authorization (EUA). This EUA will remain  in effect (meaning this test can be used) for the duration of the COVID-19 declaration under Section 564(b)(1) of the Act, 21 U.S.C.section 360bbb-3(b)(1), unless the authorization is terminated  or revoked sooner.       Influenza A by PCR NEGATIVE NEGATIVE Final   Influenza B by PCR NEGATIVE NEGATIVE Final    Comment: (NOTE) The Xpert Xpress SARS-CoV-2/FLU/RSV plus assay is intended as an aid in the diagnosis of influenza from Nasopharyngeal swab specimens and should not be used as a sole basis for treatment. Nasal washings and aspirates are unacceptable for Xpert Xpress SARS-CoV-2/FLU/RSV testing.  Fact Sheet for Patients: EntrepreneurPulse.com.au  Fact Sheet for Healthcare Providers: IncredibleEmployment.be  This test is not yet approved or cleared by the [place] FDA and has been authorized for detection and/or diagnosis of SARS-CoV-2 by FDA under an Emergency Use Authorization (EUA). This EUA will remain in  effect (meaning this test can be used) for the duration of the COVID-19 declaration under Section 564(b)(1) of the Act, 21 U.S.C. section 360bbb-3(b)(1), unless the authorization is terminated or revoked.  Performed at [facility], [address], [place], [place] [address]    Radiology Studies: CT Angio Chest PE W/Cm &/Or Wo Cm  Result Date: 10/20/2021 CLINICAL DATA:  Pulmonary embolism suspected. Unknown D-dimer. Productive cough, intermittent diarrhea and nausea for 2 weeks. EXAM: CT ANGIOGRAPHY CHEST WITH CONTRAST TECHNIQUE: Multidetector CT imaging of the chest was performed using the standard protocol during bolus administration of intravenous contrast. Multiplanar CT image reconstructions and MIPs were obtained to evaluate the vascular anatomy. RADIATION DOSE REDUCTION: This exam was performed according to the departmental dose-optimization program which includes automated exposure control, adjustment of the mA and/or kV according to patient size and/or use of iterative reconstruction technique. CONTRAST:  11mL OMNIPAQUE IOHEXOL 350 MG/ML SOLN COMPARISON:  Chest CT dated 08/27/2021. chest CT dated 09/23/2020. FINDINGS: Cardiovascular: Some of the most peripheral segmental and subsegmental pulmonary artery branches can not be definitively characterized due to considerable patient breathing motion artifact, however, there is no pulmonary embolism identified within the main, lobar or central segmental pulmonary arteries bilaterally. Cardiomegaly. No substantial pericardial effusion. No thoracic aortic aneurysm or evidence of aortic dissection. Three-vessel coronary artery calcifications. Surgical changes of median sternotomy and CABG. Mediastinum/Nodes: Conglomerate lymphadenopathy within the subcarinal space of the mediastinum, significantly increased compared to chest CT of 09/23/2020 and similar in appearance compared to more recent chest CT of 01/02/2021. Numerous additional small and  moderately enlarged lymph nodes throughout the remainder of the mediastinum. Esophagus is unremarkable, where seen. Trachea and central bronchi are unremarkable. Lungs/Pleura: Patchy consolidations within the RIGHT lower lobe. Mild atelectasis at the LEFT lung base. Small RIGHT pleural effusion. No pneumothorax. Upper Abdomen: No acute findings. Musculoskeletal: No acute-appearing osseous abnormality. Review of the MIP images confirms the above findings. IMPRESSION: 1. Patchy consolidations within the RIGHT lower lobe, likely pneumonia. 2. Conglomerate lymphadenopathy within the subcarinal space of the mediastinum, significantly increased compared to chest CT of 09/23/2020 and similar in appearance compared to more recent chest CT of 01/02/2021, differential considerations including benign reactive lymphadenopathy, metastatic lymphadenopathy and lymphoma. 3. No pulmonary embolism seen, with mild study limitations detailed above. 4. Marked cardiomegaly. Three-vessel coronary artery calcifications. Surgical changes of median sternotomy and CABG. 5. Small RIGHT pleural effusion. Electronically Signed   By: Franki Cabot M.D.   On: 10/20/2021 18:12   DG Chest 2 View  Result Date: 10/20/2021 CLINICAL DATA:  Cough 2 weeks with shortness of breath.  Hypoxia. EXAM: CHEST - 2 VIEW COMPARISON:  10/02/2021 FINDINGS: Lungs are adequately inflated with persistent hazy perihilar opacification compatible with mild vascular congestion/edema. No significant effusion. Moderate stable cardiomegaly. Remainder of the exam is unchanged. IMPRESSION: Moderate stable cardiomegaly with findings suggesting mild vascular congestion/edema. Electronically Signed   By: Marin Olp M.D.   On: 10/20/2021 16:31    Scheduled Meds:  (feeding supplement) PROSource Plus  30 mL Oral BID BM   aspirin EC  81 mg Oral Daily   Chlorhexidine Gluconate Cloth  6 each Topical Q0600   cloNIDine  0.3 mg Oral TID   darbepoetin (ARANESP) injection -  DIALYSIS  200 mcg Intravenous Q Mon-HD   heparin  5,000 Units Subcutaneous Q8H   hydrALAZINE  100 mg Oral TID   insulin aspart  0-6 Units Subcutaneous TID WC   ipratropium-albuterol  3 mL Nebulization Q6H   irbesartan  300 mg Oral Daily   isosorbide mononitrate  60 mg Oral Daily   labetalol  400 mg Oral TID   montelukast  10 mg Oral Q supper   NIFEdipine  60 mg Oral BID   pantoprazole  40 mg Oral Daily   predniSONE  5 mg Oral Q breakfast   sodium chloride flush  3 mL Intravenous Q12H   umeclidinium bromide  1 puff Inhalation Daily   Continuous Infusions:  azithromycin (ZITHROMAX) 500 mg in sodium chloride 0.9 % 250 mL IVPB Stopped (10/20/21 2120)   cefTRIAXone (ROCEPHIN)  IV Stopped (10/20/21 2021)     LOS: 1 day    Time spent: 50 mins    Margaruite Top, MD [facility]   If 7PM-7AM, please contact night-coverage

## 2021-10-21 NOTE — Progress Notes (Signed)
Pt receives out-pt HD at [facility] in [facility] on MWF. Pt has a 5:45 am chair time. Will assist as needed.   Melven Sartorius Renal Navigator [phone]

## 2021-10-21 NOTE — Consult Note (Signed)
[facility] Renal Consultation Note    Indication for Consultation:  Management of ESRD/hemodialysis, anemia, hypertension/volume, and secondary hyperparathyroidism. PCP:  HPI: Jodi Bell is a 61 y.o. female with ESRD, HTN, COPD on home O2, CAD (Hx CABG 09/2020), HFpEF who was admitted with dyspnea felt to be combination of early pneumonia and heart failure.  She presented to [facility] ED yesterday with 2 weeks of cough, N/V/D, and progressive dyspnea. Has not been able to eat or drink much over the past 2 weeks and has missed HD. + sick contact with her grandchild who is in daycare. She has been afebrile. In ED, she was hypoxic on RA - improved with her O2. Labs with Na 131, K 4.1, BUN 25, Cr 7.6, Ca 9.2, Alb 2.9, WBC 4.1, Hgb 8.5, BNP > 4500. CXR was suggestive of pulm edema. Later underwent CT angio which was negative for PE, showing RLL pneumonia, small R effusion, and ?reactive central LAD. COVID/Flu negative. She was started on IV Ceftriaxone + azithromycin and transferred to [facility].  Seen today in her room. Coughing heavily and wheezing, asking for tessalon perles to be ordered which I can do for her. Denies CP and dyspnea is stable at this time. No abdominal pain. Had ongoing diarrhea overnight.  Dialyzes on MWF schedule at [facility]. Has missed HD recently because she has been feeling poorly. Uses LUE AVF for her access - denies recent issues.  Past Medical History:  Diagnosis Date   Anemia of chronic disease    Anxiety    Arthritis    oa and psoriatic ra   Asthma    Back pain, chronic    lower back   Candida infection finished tx 2 days ago   Chest pain 07/16/2021   Chronic insomnia    COPD (chronic obstructive pulmonary disease) ([place])    Coronary artery calcification seen on CT scan    Diabetes mellitus    type 2   Eczema    ESRD on dialysis [facility])    Essential hypertension    GERD (gastroesophageal reflux disease)    Gout    History of hiatal hernia     Hypoalbuminemia    Hyponatremia    Melena 09/20/2017   Mild aortic stenosis    Mitral regurgitation    Mitral stenosis    Obesity    PAD (peripheral artery disease) (HCC)    a. externial iliac calcification seen on CT 04/2020.   Pneumonia few yrs ago x 2   Sleep apnea    Tobacco abuse    Upper GI bleed 05/2019   Past Surgical History:  Procedure Laterality Date   BACK SURGERY  2016   lower back fusion with cage   BIOPSY  09/23/2017   Procedure: BIOPSY;  Surgeon: Wonda Horner, MD;  Location: [facility];  Service: Endoscopy;;   BIOPSY  05/19/2019   Procedure: BIOPSY;  Surgeon: Otis Brace, MD;  Location: [place];  Service: Gastroenterology;;   CESAREAN SECTION  1990   x 1    COLONOSCOPY WITH PROPOFOL N/A 09/23/2017   Procedure: COLONOSCOPY WITH PROPOFOL Hemostatic clips placed;  Surgeon: Wonda Horner, MD;  [facility]: [facility];  Service: Endoscopy;  Laterality: N/A;   COLONOSCOPY WITH PROPOFOL N/A 10/02/2020   Procedure: COLONOSCOPY WITH PROPOFOL;  Surgeon: Arta Silence, MD;  Location: [place];  Service: Endoscopy;  Laterality: N/A;   CORONARY ARTERY BYPASS GRAFT N/A 09/13/2020   Procedure: CORONARY ARTERY BYPASS GRAFTING (CABG), ON PUMP, TIMES TWO,  USING LEFT INTERNAL MAMMARY ARTERY AND RIGHT ENDOSCOPICALLY HARVESTED GREATER SAPHENOUS VEIN;  Surgeon: Gaye Pollack, MD;  Location: [place];  Service: Open Heart Surgery;  Laterality: N/A;   DILATION AND CURETTAGE OF UTERUS  1988   ENTEROSCOPY N/A 07/22/2021   Procedure: ENTEROSCOPY;  Surgeon: Ronnette Juniper, MD;  Location: [place];  Service: Gastroenterology;  Laterality: N/A;   ESOPHAGOGASTRODUODENOSCOPY N/A 09/22/2020   Procedure: ESOPHAGOGASTRODUODENOSCOPY (EGD);  Surgeon: Arta Silence, MD;  Location: [facility] ENDOSCOPY;  Service: Endoscopy;  Laterality: N/A;   ESOPHAGOGASTRODUODENOSCOPY (EGD) WITH PROPOFOL N/A 09/23/2017   Procedure: ESOPHAGOGASTRODUODENOSCOPY (EGD) WITH PROPOFOL;  Surgeon: Wonda Horner, MD;   [facility];  Service: Endoscopy;  Laterality: N/A;   ESOPHAGOGASTRODUODENOSCOPY (EGD) WITH PROPOFOL N/A 05/19/2019   Procedure: ESOPHAGOGASTRODUODENOSCOPY (EGD) WITH PROPOFOL;  Surgeon: Otis Brace, MD;  Location: [place];  Service: Gastroenterology;  Laterality: N/A;   ESOPHAGOGASTRODUODENOSCOPY (EGD) WITH PROPOFOL N/A 10/02/2020   Procedure: ESOPHAGOGASTRODUODENOSCOPY (EGD) WITH PROPOFOL;  Surgeon: Arta Silence, MD;  Location: [place];  Service: Endoscopy;  Laterality: N/A;   GIVENS CAPSULE STUDY N/A 05/19/2019   Procedure: GIVENS CAPSULE STUDY;  Surgeon: Otis Brace, MD;  Location: [place];  Service: Gastroenterology;  Laterality: N/A;   GIVENS CAPSULE STUDY N/A 07/19/2021   Procedure: GIVENS CAPSULE STUDY;  Surgeon: Clarene Essex, MD;  Location: [place];  Service: Gastroenterology;  Laterality: N/A;   HEMOSTASIS CLIP PLACEMENT  09/22/2020   Procedure: HEMOSTASIS CLIP PLACEMENT;  Surgeon: Arta Silence, MD;  Location: [place];  Service: Endoscopy;;   HEMOSTASIS CONTROL  09/22/2020   Procedure: HEMOSTASIS CONTROL;  Surgeon: Arta Silence, MD;  Location: [place];  Service: Endoscopy;;   HOT HEMOSTASIS N/A 05/19/2019   Procedure: HOT HEMOSTASIS (ARGON PLASMA COAGULATION/BICAP);  Surgeon: Otis Brace, MD;  Location: [facility] ENDOSCOPY;  Service: Gastroenterology;  Laterality: N/A;   LEFT HEART CATH AND CORONARY ANGIOGRAPHY N/A 09/10/2020   Procedure: LEFT HEART CATH AND CORONARY ANGIOGRAPHY;  Surgeon: Nelva Bush, MD;  Location: [place] CV LAB;  Service: Cardiovascular;  Laterality: N/A;   PARTIAL KNEE ARTHROPLASTY Left 11/12/2017   Procedure: left unicompartmental arthroplasty-medial;  Surgeon: Paralee Cancel, MD;  [facility]: [facility];  Service: Orthopedics;  Laterality: Left;  67min   SUBMUCOSAL INJECTION  09/23/2017   Procedure: SUBMUCOSAL INJECTION;  Surgeon: Wonda Horner, MD;  [facility]: [facility];  Service: Endoscopy;;  in colon   TEE  WITHOUT CARDIOVERSION N/A 09/13/2020   Procedure: TRANSESOPHAGEAL ECHOCARDIOGRAM (TEE);  Surgeon: Gaye Pollack, MD;  Location: [place];  Service: Open Heart Surgery;  Laterality: N/A;   TUBAL LIGATION  1990   Family History  Problem Relation Age of Onset   Lung cancer Mother    Diabetes Sister    Heart disease Sister    Asthma Daughter    Arthritis Daughter    Arthritis Daughter    Thyroid cancer Other        1/2 sister   Heart disease Other        1/2 sister   Social History:  reports that she quit smoking about 14 months ago. Her smoking use included cigarettes. She has a 20.00 pack-year smoking history. She has never used smokeless tobacco. She reports current alcohol use. She reports that she does not use drugs.  ROS: As per HPI otherwise negative. + cough, dyspnea, N/V/D - fever, chills, abdominal pain  Physical Exam: Vitals:   10/21/21 0505 10/21/21 0700 10/21/21 0728 10/21/21 0954  BP: (!) 206/69 (!) 197/75 (!) 209/69 (!) 216/80  Pulse: 65 71 65 63  Resp: 18 17 18 19   Temp: 98.4 F (36.9 C)  98.2 F (36.8 C) 98.9 F (37.2 C)  TempSrc:    Oral  SpO2: (!) 89% 91% 100% 99%  Weight:      Height:         General: Well developed, well nourished, in no acute distress, using nasal O2. Head: Normocephalic, atraumatic, sclera non-icteric, mucus membranes are moist. Neck: Supple without lymphadenopathy/masses. JVD not elevated. Lungs: Wheezing/rhonchi throughout all lung fields, coughing throughout exam Heart: RRR; 2/6 murmur Abdomen: Soft, non-tender, non-distended with normoactive bowel sounds.  Musculoskeletal:  Strength and tone appear normal for age. Lower extremities: No edema or ischemic changes, no open wounds. Neuro: Alert and oriented X 3. Moves all extremities spontaneously. Psych:  Responds to questions appropriately with a normal affect. Dialysis Access: LUE AVF + bruit  Allergies  Allergen Reactions   3-Methyl-2-Benzothiazolinone Hydrazone Other (See  Comments)    Muscle weakness muscle cramping in legs Other reaction(s): Myalgias (Muscle Pain)   Banana Anaphylaxis, Swelling and Other (See Comments)    TONGUE AND MOUTH SWELLING   Black Walnut Flavor Anaphylaxis and Itching    Walnuts   Hazelnut ([place]) Allergy Skin Test Anaphylaxis    Hazelnuts   Leflunomide And Related Other (See Comments)    Severe Headache   Lisinopril Swelling    Angioedema  Swelling of the face   No [employer]alt[employer] Food Allergies Anaphylaxis    Spicy Mustard 4.7.2021 Pt reports that she eats Regular Yellow Mustard Swelling and itching of tongue   Other Other (See Comments), Anaphylaxis and Swelling    Cosentyx= angioedema  Other reaction(s): Facial Edema (intolerance) Mouth itches and tongue swelling Hazel nuts and pecans also Walnuts Walnuts Renal failure Other reaction(s): Other Serotonin Syndrome  Serotonin syndrome  Tremors Other reaction(s): Other Tremors Muscle weakness muscle cramping in legs Other reaction(s): Myalgias (Muscle Pain) Mouth itches and tongue swelling Hazel nuts and pecans also Walnuts    Pecan Extract Allergy Skin Test Anaphylaxis and Itching    Pecans    Pecan Nut (Diagnostic) Anaphylaxis and Itching    Pecans   Trazodone And Nefazodone Other (See Comments)    Serotonin Syndrome  Tremors   Adalimumab Other (See Comments)    Blisters on abdomen =humira   Escitalopram Oxalate Other (See Comments)    Hand problems - Serotonin Syndrome     Ezetimibe Other (See Comments)    myalgias cramps    Secukinumab Swelling    Cosentyx = angiodema    Statins Other (See Comments)    Leg pains and weakness   Ferrlecit [Na Ferric Gluc Cplx In Sucrose]     Not reaction given from HD   Gabapentin Other (See Comments)    tremors   Ibuprofen Other (See Comments)    Renal Problems   Duloxetine Rash   Prior to Admission medications   Medication Sig Start Date End Date Taking? Authorizing Provider  acetaminophen (TYLENOL)  500 MG tablet Take 1,000 mg by mouth daily as needed (pain).   Yes [provider]  albuterol (PROVENTIL) (2.5 MG/3ML) 0.083% nebulizer solution Take 3 mLs (2.5 mg total) by nebulization every 6 (six) hours as needed for wheezing or shortness of breath. 07/10/21  Yes Hunsucker, Bonna Gains, MD  allopurinol (ZYLOPRIM) 100 MG tablet Take 100 mg by mouth every evening.   Yes [provider]  calcium acetate (PHOSLO) 667 MG capsule Take 1,334 mg by mouth 3 (three) times daily  with meals. 05/13/21  Yes [provider]  carboxymethylcellulose 1 % ophthalmic solution Place 2 drops into both eyes 2 (two) times daily as needed (dry eyes).   Yes [provider]  cetirizine (ZYRTEC) 10 MG tablet Take 10 mg by mouth daily as needed for allergies.   Yes [provider]  cholecalciferol (VITAMIN D) 25 MCG tablet Take 1 tablet (1,000 Units total) by mouth every evening. 10/04/20  Yes Gold, Wayne E, PA-C  cholecalciferol (VITAMIN D3) 10 MCG (400 UNIT) TABS tablet Take 800 Units by mouth daily.   Yes [provider]  cinacalcet (SENSIPAR) 60 MG tablet Take 60 mg by mouth daily.   Yes [provider]  cloNIDine (CATAPRES) 0.3 MG tablet Take 0.3 mg by mouth 3 (three) times daily.   Yes [provider]  cyclobenzaprine (FLEXERIL) 10 MG tablet Take 10 mg by mouth 2 (two) times daily as needed for muscle spasms.   Yes [provider]  Darbepoetin Alfa (ARANESP) 200 MCG/0.4ML SOSY injection Inject 0.4 mLs (200 mcg total) into the vein every Wednesday with hemodialysis. 10/10/20  Yes Gold, Wayne E, PA-C  diclofenac Sodium (VOLTAREN) 1 % GEL Apply 2 g topically daily as needed (pain).   Yes [provider]  docusate sodium (COLACE) 100 MG capsule Take 100-200 mg by mouth daily as needed for mild constipation.   Yes [provider]  ferrous sulfate 325 (65 FE) MG tablet Take 325 mg by mouth See admin instructions. Take 325 mg by mouth  three times a week Tues, Thur, Sat   Yes [provider]  folic acid (FOLVITE) 456 MCG tablet Take 400 mcg by mouth daily.   Yes [provider]  gabapentin (NEURONTIN) 300 MG capsule Take 1 capsule (300 mg total) by mouth at bedtime as needed (Neuropathic pain). Patient taking differently: Take 300 mg by mouth daily as needed (Neuropathic pain). 12/14/17 10/21/21 Yes Dana Allan I, MD  heparin 1000 unit/mL SOLN injection 3 mLs (3,000 Units total) by Dialysis route as needed (in dialysis as needed). 10/04/20  Yes Gold, Wayne E, PA-C  heparin 1000 unit/mL SOLN injection 2 mLs (2,000 Units total) by Dialysis route as needed (in dialysis as needed). 10/04/20  Yes Gold, Wayne E, PA-C  heparin 1000 unit/mL SOLN injection 1 mL (1,000 Units total) by Dialysis route as needed (in dialysis). 10/04/20  Yes Gold, Wayne E, PA-C  hydrALAZINE (APRESOLINE) 100 MG tablet Take 1 tablet (100 mg total) by mouth every 8 (eight) hours. Patient taking differently: Take 100 mg by mouth 3 (three) times daily. 07/06/20  Yes Cresenzo, Angelyn Punt, MD  hydrocortisone (ANUSOL-HC) 2.5 % rectal cream Place rectally 2 (two) times daily. Patient taking differently: Place 1 Application rectally daily as needed for hemorrhoids or anal itching. 07/23/21  Yes [name], MD  isosorbide mononitrate (IMDUR) 60 MG 24 hr tablet Take 1 tablet (60 mg total) by mouth daily. 08/14/21  Yes Bhagat, Bhavinkumar, PA  labetalol (NORMODYNE) 200 MG tablet Take 2 tablets (400 mg total) by mouth 3 (three) times daily. 07/11/21  Yes Bhagat, Bhavinkumar, PA  lidocaine (LIDODERM) 5 % Place 1 patch onto the skin every 12 (twelve) hours as needed. Remove & Discard patch within 12 hours or as directed by MD Patient taking differently: Place 1 patch onto the skin daily as needed (pain). Remove & Discard patch within 12 hours or as directed by MD 07/23/21  Yes [name], MD  LORazepam (ATIVAN) 1 MG tablet Take 1  mg by mouth every 8 (eight) hours as  needed for anxiety. 06/25/20  Yes [provider]  montelukast (SINGULAIR) 10 MG tablet Take 10 mg by mouth daily with supper.    Yes [provider]  NIFEdipine (PROCARDIA XL/NIFEDICAL XL) 60 MG 24 hr tablet Take 60 mg by mouth 2 (two) times daily. 10/01/21  Yes [provider]  olmesartan (BENICAR) 40 MG tablet Take 40 mg by mouth every evening. 05/06/19  Yes [provider]  ondansetron (ZOFRAN) 4 MG tablet Take 4 mg by mouth 2 (two) times daily as needed for nausea or vomiting.   Yes [provider]  OVER THE COUNTER MEDICATION Take by mouth daily as needed (sickness). Elderberry Fruit and flower 1 dropper full by mouth daily as needed for sickness   Yes [provider]  Oxycodone HCl 10 MG TABS Take 10 mg by mouth daily as needed for pain. 01/11/21  Yes [provider]  OXYGEN Inhale 2 L into the lungs as directed.   Yes [provider]  pantoprazole (PROTONIX) 40 MG tablet Take 40 mg by mouth daily. 06/09/20  Yes [provider]  predniSONE (DELTASONE) 5 MG tablet Take 4 tablets (20 mg total) by mouth daily with breakfast for 2 days, THEN 1 tablet (5 mg total) daily with breakfast. Patient taking differently: Take 1 tablet (5 mg total) daily with breakfast. 06/29/21 12/28/21 Yes Amponsah, Charisse March, MD  Probiotic Product (PROBIOTIC PO) Take 1 capsule by mouth daily.   Yes [provider]  sorbitol 70 % SOLN Take 30 ml 1-2 times daily as needed for constipation 07/23/21  Yes [name], MD  Tiotropium Bromide Monohydrate (SPIRIVA RESPIMAT) 2.5 MCG/ACT AERS Inhale 2 puffs into the lungs 2 (two) times daily as needed (wheezing/SOB). 07/10/21  Yes Hunsucker, Bonna Gains, MD  VENTOLIN HFA 108 (90 Base) MCG/ACT inhaler Inhale 2 puffs into the lungs See admin instructions. Take 2 puffs every 4-6 hours as needed for wheezing and shortness of breath 07/10/21  Yes Hunsucker, Bonna Gains, MD  zolpidem (AMBIEN) 5 MG tablet Take 5  mg by mouth at bedtime as needed for sleep. 02/04/21  Yes [provider]  aspirin EC 81 MG tablet Take 1 tablet (81 mg total) by mouth daily. Swallow whole. 10/15/21   Leanor Kail, PA   Current Facility-Administered Medications  Medication Dose Route Frequency Provider Last Rate Last Admin   acetaminophen (TYLENOL) tablet 650 mg  650 mg Oral Q6H PRN Marcelyn Bruins, MD   650 mg at 10/21/21 0038   Or   acetaminophen (TYLENOL) suppository 650 mg  650 mg Rectal Q6H PRN Marcelyn Bruins, MD       albuterol (PROVENTIL) (2.5 MG/3ML) 0.083% nebulizer solution 2.5 mg  2.5 mg Nebulization Q6H PRN Marcelyn Bruins, MD       aspirin EC tablet 81 mg  81 mg Oral Daily Marcelyn Bruins, MD       azithromycin (ZITHROMAX) 500 mg in sodium chloride 0.9 % 250 mL IVPB  500 mg Intravenous Q24H Cristie Hem, MD   Stopped at 10/20/21 2120   benzonatate (TESSALON) capsule 100 mg  100 mg Oral BID PRN Loren Racer, PA-C       cefTRIAXone (ROCEPHIN) 1 g in sodium chloride 0.9 % 100 mL IVPB  1 g Intravenous Q24H Cristie Hem, MD   Stopped at 10/20/21 2021   Chlorhexidine Gluconate Cloth 2 % PADS 6 each  6 each Topical Q0600  Roney Jaffe, MD   6 each at 10/21/21 0533   cloNIDine (CATAPRES) tablet 0.3 mg  0.3 mg Oral TID Marcelyn Bruins, MD   0.3 mg at 10/21/21 0038   guaiFENesin (ROBITUSSIN) 100 MG/5ML liquid 10 mL  10 mL Oral Q4H PRN Irene Pap N, DO   10 mL at 10/21/21 0533   heparin injection 5,000 Units  5,000 Units Subcutaneous Q8H Marcelyn Bruins, MD   5,000 Units at 10/21/21 0533   hydrALAZINE (APRESOLINE) tablet 100 mg  100 mg Oral TID Marcelyn Bruins, MD   100 mg at 10/21/21 0807   insulin aspart (novoLOG) injection 0-6 Units  0-6 Units Subcutaneous TID WC Marcelyn Bruins, MD   1 Units at 10/21/21 1194   ipratropium-albuterol (DUONEB) 0.5-2.5 (3) MG/3ML nebulizer solution 3 mL  3 mL Nebulization Q6H Hall, Carole N, DO       irbesartan (AVAPRO)  tablet 300 mg  300 mg Oral Daily Marcelyn Bruins, MD       isosorbide mononitrate (IMDUR) 24 hr tablet 60 mg  60 mg Oral Daily Marcelyn Bruins, MD   60 mg at 10/21/21 1740   labetalol (NORMODYNE) tablet 400 mg  400 mg Oral TID Marcelyn Bruins, MD   400 mg at 10/21/21 0038   LORazepam (ATIVAN) tablet 1 mg  1 mg Oral Q8H PRN Marcelyn Bruins, MD       montelukast (SINGULAIR) tablet 10 mg  10 mg Oral Q supper Marcelyn Bruins, MD       NIFEdipine (PROCARDIA XL/NIFEDICAL XL) 24 hr tablet 60 mg  60 mg Oral BID Marcelyn Bruins, MD   60 mg at 10/21/21 0038   pantoprazole (PROTONIX) EC tablet 40 mg  40 mg Oral Daily Marcelyn Bruins, MD   40 mg at 10/21/21 0807   polyethylene glycol (MIRALAX / GLYCOLAX) packet 17 g  17 g Oral Daily PRN Marcelyn Bruins, MD       predniSONE (DELTASONE) tablet 5 mg  5 mg Oral Q breakfast Marcelyn Bruins, MD   5 mg at 10/21/21 0807   sodium chloride flush (NS) 0.9 % injection 3 mL  3 mL Intravenous Q12H Marcelyn Bruins, MD   3 mL at 10/20/21 2337   umeclidinium bromide (INCRUSE ELLIPTA) 62.5 MCG/ACT 1 puff  1 puff Inhalation Daily Reome, Earle J, RPH   1 puff at 10/21/21 0747   zolpidem (AMBIEN) tablet 5 mg  5 mg Oral QHS PRN Marcelyn Bruins, MD   5 mg at 10/21/21 0047   Labs: Basic Metabolic Panel: Recent Labs  Lab 10/20/21 1609 10/20/21 1738 10/21/21 0628  NA 133* 132* 131*  K 3.6 4.0 4.1  CL 95*  --  95*  CO2 27  --  23  GLUCOSE 105*  --  193*  BUN 22  --  25*  CREATININE 6.57*  --  7.61*  CALCIUM 8.7*  --  9.2  PHOS  --   --  4.2   Liver Function Tests: Recent Labs  Lab 10/20/21 1609 10/21/21 0628  AST 17  --   ALT 11  --   ALKPHOS 260*  --   BILITOT 0.7  --   PROT 7.0  --   ALBUMIN 3.3* 2.9*   CBC: Recent Labs  Lab 10/20/21 1609 10/20/21 1738 10/21/21 0628  WBC 5.8  --  4.1  NEUTROABS 3.2  --   --   HGB 9.2* 9.9* 8.5*  HCT 28.4* 29.0* 24.9*  MCV 104.8*  --  104.6*  PLT 228  --  207    Studies/Results: CT Angio Chest PE W/Cm &/Or Wo Cm  Result Date: 10/20/2021 CLINICAL DATA:  Pulmonary embolism suspected. Unknown D-dimer. Productive cough, intermittent diarrhea and nausea for 2 weeks. EXAM: CT ANGIOGRAPHY CHEST WITH CONTRAST TECHNIQUE: Multidetector CT imaging of the chest was performed using the standard protocol during bolus administration of intravenous contrast. Multiplanar CT image reconstructions and MIPs were obtained to evaluate the vascular anatomy. RADIATION DOSE REDUCTION: This exam was performed according to the departmental dose-optimization program which includes automated exposure control, adjustment of the mA and/or kV according to patient size and/or use of iterative reconstruction technique. CONTRAST:  66mL OMNIPAQUE IOHEXOL 350 MG/ML SOLN COMPARISON:  Chest CT dated 08/27/2021. chest CT dated 09/23/2020. FINDINGS: Cardiovascular: Some of the most peripheral segmental and subsegmental pulmonary artery branches can not be definitively characterized due to considerable patient breathing motion artifact, however, there is no pulmonary embolism identified within the main, lobar or central segmental pulmonary arteries bilaterally. Cardiomegaly. No substantial pericardial effusion. No thoracic aortic aneurysm or evidence of aortic dissection. Three-vessel coronary artery calcifications. Surgical changes of median sternotomy and CABG. Mediastinum/Nodes: Conglomerate lymphadenopathy within the subcarinal space of the mediastinum, significantly increased compared to chest CT of 09/23/2020 and similar in appearance compared to more recent chest CT of 01/02/2021. Numerous additional small and moderately enlarged lymph nodes throughout the remainder of the mediastinum. Esophagus is unremarkable, where seen. Trachea and central bronchi are unremarkable. Lungs/Pleura: Patchy consolidations within the RIGHT lower lobe. Mild atelectasis at the LEFT lung base. Small RIGHT pleural effusion.  No pneumothorax. Upper Abdomen: No acute findings. Musculoskeletal: No acute-appearing osseous abnormality. Review of the MIP images confirms the above findings. IMPRESSION: 1. Patchy consolidations within the RIGHT lower lobe, likely pneumonia. 2. Conglomerate lymphadenopathy within the subcarinal space of the mediastinum, significantly increased compared to chest CT of 09/23/2020 and similar in appearance compared to more recent chest CT of 01/02/2021, differential considerations including benign reactive lymphadenopathy, metastatic lymphadenopathy and lymphoma. 3. No pulmonary embolism seen, with mild study limitations detailed above. 4. Marked cardiomegaly. Three-vessel coronary artery calcifications. Surgical changes of median sternotomy and CABG. 5. Small RIGHT pleural effusion. Electronically Signed   By: Franki Cabot M.D.   On: 10/20/2021 18:12   DG Chest 2 View  Result Date: 10/20/2021 CLINICAL DATA:  Cough 2 weeks with shortness of breath.  Hypoxia. EXAM: CHEST - 2 VIEW COMPARISON:  10/02/2021 FINDINGS: Lungs are adequately inflated with persistent hazy perihilar opacification compatible with mild vascular congestion/edema. No significant effusion. Moderate stable cardiomegaly. Remainder of the exam is unchanged. IMPRESSION: Moderate stable cardiomegaly with findings suggesting mild vascular congestion/edema. Electronically Signed   By: Marin Olp M.D.   On: 10/20/2021 16:31    Dialysis Orders:  [facility] on MWF 3:30hr, 400/600, EDW 62kg, 2K/2.5Ca, LUE AVF, no heparin - Epogen 20,000U TIW - Zemplar 8.72mcg IV q HD - Venofer 50mg  IV q week - HBsAb + 03/06/21  Assessment/Plan:  Dyspnea/cough: Possible RLL pneumonia + COPD exacerbation + fluid overload component. On IV abx. For HD today, she is below prior EDW but likely has lost body weight with recent illness, UF as tolerated.  ESRD:  Usual MWF schedule, has missed HD this week, HD today - UF as tolerated.  Hypertension/volume: BP very  high, resume home meds and will remove fluid with HD.  Anemia of ESRD: Hgb 8.5, on high dose ESA as outpatient,  start Aranesp 21mcg weekly here.  Metabolic bone disease: CorrCa high, will wait on VDRA for now. Check Phos.  Nutrition:  Alb low, will add protein supplements. CAD/Hx CABG 2022/HFpEF COPD/prior tobacco use  Veneta Penton, PA-C 10/21/2021, 10:26 AM  [employer]

## 2021-10-22 ENCOUNTER — Encounter (HOSPITAL_COMMUNITY): Payer: Medicare PPO

## 2021-10-22 DIAGNOSIS — J9621 Acute and chronic respiratory failure with hypoxia: Secondary | ICD-10-CM | POA: Diagnosis not present

## 2021-10-22 LAB — BASIC METABOLIC PANEL
Anion gap: 9 (ref 5–15)
BUN: 11 mg/dL (ref 8–23)
CO2: 27 mmol/L (ref 22–32)
Calcium: 8.5 mg/dL — ABNORMAL LOW (ref 8.9–10.3)
Chloride: 98 mmol/L (ref 98–111)
Creatinine, Ser: 3.88 mg/dL — ABNORMAL HIGH (ref 0.44–1.00)
GFR, Estimated: 13 mL/min — ABNORMAL LOW (ref >60)
Glucose, Bld: 118 mg/dL — ABNORMAL HIGH (ref 70–99)
Potassium: 3.1 mmol/L — ABNORMAL LOW (ref 3.5–5.1)
Sodium: 134 mmol/L — ABNORMAL LOW (ref 135–145)

## 2021-10-22 LAB — CBC
HCT: 23.5 % — ABNORMAL LOW (ref 36.0–46.0)
Hemoglobin: 8 g/dL — ABNORMAL LOW (ref 12.0–15.0)
MCH: 35.9 pg — ABNORMAL HIGH (ref 26.0–34.0)
MCHC: 34 g/dL (ref 30.0–36.0)
MCV: 105.4 fL — ABNORMAL HIGH (ref 80.0–100.0)
Platelets: 206 10*3/uL (ref 150–400)
RBC: 2.23 MIL/uL — ABNORMAL LOW (ref 3.87–5.11)
RDW: 14.4 % (ref 11.5–15.5)
WBC: 4.6 10*3/uL (ref 4.0–10.5)
nRBC: 0 % (ref 0.0–0.2)

## 2021-10-22 LAB — GLUCOSE, CAPILLARY
Glucose-Capillary: 92 mg/dL (ref 70–99)
Glucose-Capillary: 123 mg/dL — ABNORMAL HIGH (ref 70–99)
Glucose-Capillary: 172 mg/dL — ABNORMAL HIGH (ref 70–99)
Glucose-Capillary: 126 mg/dL — ABNORMAL HIGH (ref 70–99)

## 2021-10-22 LAB — HEPATITIS C ANTIBODY: HCV Ab: NONREACTIVE — AB

## 2021-10-22 LAB — PHOSPHORUS: Phosphorus: 2.5 mg/dL (ref 2.5–4.6)

## 2021-10-22 LAB — HEPATITIS B SURFACE ANTIBODY, QUANTITATIVE: Hep B S AB Quant (Post): 11.9 m[IU]/mL (ref >9.9)

## 2021-10-22 LAB — MAGNESIUM: Magnesium: 1.5 mg/dL — ABNORMAL LOW (ref 1.7–2.4)

## 2021-10-22 MED ORDER — POTASSIUM CHLORIDE 20 MEQ PO PACK
40.0000 meq | PACK | Freq: Once | ORAL | Status: AC
  Administered 2021-10-22: 40 meq via ORAL
  Filled 2021-10-22: qty 2

## 2021-10-22 MED ORDER — MAGNESIUM SULFATE 2 GM/50ML IV SOLN
2.0000 g | Freq: Once | INTRAVENOUS | Status: AC
  Administered 2021-10-22: 2 g via INTRAVENOUS
  Filled 2021-10-22: qty 50

## 2021-10-22 MED ORDER — AZITHROMYCIN 500 MG PO TABS
500.0000 mg | ORAL_TABLET | ORAL | Status: DC
  Administered 2021-10-22 – 2021-10-24 (×3): 500 mg via ORAL
  Filled 2021-10-22 (×3): qty 1

## 2021-10-22 NOTE — Progress Notes (Signed)
[facility] Progress Note   Subjective:  Seen in room - still coughing, but breathing is a little better today. Dialyzed yesterday with 3.5L removed.  Objective Vitals:   10/21/21 1812 10/21/21 2113 10/22/21 0500 10/22/21 0944  BP: (!) 162/69 (!) 151/65 (!) 148/62 (!) 157/72  Pulse: 70 72 73 62  Resp: 16 16 17 18   Temp: 99.1 F (37.3 C) 99 F (37.2 C) 98.1 F (36.7 C) 98.2 F (36.8 C)  TempSrc: Oral Oral Oral Oral  SpO2: 100% 96% 98% 100%  Weight:  57.7 kg 57.7 kg   Height:       Physical Exam General: Well appearing woman, NAD. Nasal O2 in place Heart: RRR; 2/6 murmur Lungs: CTAB - much improved from yesterday Abdomen: soft Extremities: No LE edema Dialysis Access: LUE AVF + bruit  Additional Objective Labs: Basic Metabolic Panel: Recent Labs  Lab 10/20/21 1609 10/20/21 1738 10/21/21 0628 10/22/21 0221  NA 133* 132* 131* 134*  K 3.6 4.0 4.1 3.1*  CL 95*  --  95* 98  CO2 27  --  23 27  GLUCOSE 105*  --  193* 118*  BUN 22  --  25* 11  CREATININE 6.57*  --  7.61* 3.88*  CALCIUM 8.7*  --  9.2 8.5*  PHOS  --   --  4.2 2.5   Liver Function Tests: Recent Labs  Lab 10/20/21 1609 10/21/21 0628  AST 17  --   ALT 11  --   ALKPHOS 260*  --   BILITOT 0.7  --   PROT 7.0  --   ALBUMIN 3.3* 2.9*   CBC: Recent Labs  Lab 10/20/21 1609 10/20/21 1738 10/21/21 0628 10/22/21 0221  WBC 5.8  --  4.1 4.6  NEUTROABS 3.2  --   --   --   HGB 9.2* 9.9* 8.5* 8.0*  HCT 28.4* 29.0* 24.9* 23.5*  MCV 104.8*  --  104.6* 105.4*  PLT 228  --  207 206   Studies/Results: CT Angio Chest PE W/Cm &/Or Wo Cm  Result Date: 10/20/2021 CLINICAL DATA:  Pulmonary embolism suspected. Unknown D-dimer. Productive cough, intermittent diarrhea and nausea for 2 weeks. EXAM: CT ANGIOGRAPHY CHEST WITH CONTRAST TECHNIQUE: Multidetector CT imaging of the chest was performed using the standard protocol during bolus administration of intravenous contrast. Multiplanar CT image  reconstructions and MIPs were obtained to evaluate the vascular anatomy. RADIATION DOSE REDUCTION: This exam was performed according to the departmental dose-optimization program which includes automated exposure control, adjustment of the mA and/or kV according to patient size and/or use of iterative reconstruction technique. CONTRAST:  2mL OMNIPAQUE IOHEXOL 350 MG/ML SOLN COMPARISON:  Chest CT dated 08/27/2021. chest CT dated 09/23/2020. FINDINGS: Cardiovascular: Some of the most peripheral segmental and subsegmental pulmonary artery branches can not be definitively characterized due to considerable patient breathing motion artifact, however, there is no pulmonary embolism identified within the main, lobar or central segmental pulmonary arteries bilaterally. Cardiomegaly. No substantial pericardial effusion. No thoracic aortic aneurysm or evidence of aortic dissection. Three-vessel coronary artery calcifications. Surgical changes of median sternotomy and CABG. Mediastinum/Nodes: Conglomerate lymphadenopathy within the subcarinal space of the mediastinum, significantly increased compared to chest CT of 09/23/2020 and similar in appearance compared to more recent chest CT of 01/02/2021. Numerous additional small and moderately enlarged lymph nodes throughout the remainder of the mediastinum. Esophagus is unremarkable, where seen. Trachea and central bronchi are unremarkable. Lungs/Pleura: Patchy consolidations within the RIGHT lower lobe. Mild atelectasis at the LEFT lung  base. Small RIGHT pleural effusion. No pneumothorax. Upper Abdomen: No acute findings. Musculoskeletal: No acute-appearing osseous abnormality. Review of the MIP images confirms the above findings. IMPRESSION: 1. Patchy consolidations within the RIGHT lower lobe, likely pneumonia. 2. Conglomerate lymphadenopathy within the subcarinal space of the mediastinum, significantly increased compared to chest CT of 09/23/2020 and similar in appearance  compared to more recent chest CT of 01/02/2021, differential considerations including benign reactive lymphadenopathy, metastatic lymphadenopathy and lymphoma. 3. No pulmonary embolism seen, with mild study limitations detailed above. 4. Marked cardiomegaly. Three-vessel coronary artery calcifications. Surgical changes of median sternotomy and CABG. 5. Small RIGHT pleural effusion. Electronically Signed   By: Franki Cabot M.D.   On: 10/20/2021 18:12   DG Chest 2 View  Result Date: 10/20/2021 CLINICAL DATA:  Cough 2 weeks with shortness of breath.  Hypoxia. EXAM: CHEST - 2 VIEW COMPARISON:  10/02/2021 FINDINGS: Lungs are adequately inflated with persistent hazy perihilar opacification compatible with mild vascular congestion/edema. No significant effusion. Moderate stable cardiomegaly. Remainder of the exam is unchanged. IMPRESSION: Moderate stable cardiomegaly with findings suggesting mild vascular congestion/edema. Electronically Signed   By: Marin Olp M.D.   On: 10/20/2021 16:31    Medications:  azithromycin (ZITHROMAX) 500 mg in sodium chloride 0.9 % 250 mL IVPB 500 mg (10/21/21 2108)   cefTRIAXone (ROCEPHIN)  IV 1 g (10/22/21 0021)    (feeding supplement) PROSource Plus  30 mL Oral BID BM   aspirin EC  81 mg Oral Daily   Chlorhexidine Gluconate Cloth  6 each Topical Q0600   cloNIDine  0.3 mg Oral TID   darbepoetin (ARANESP) injection - DIALYSIS  200 mcg Intravenous Q Mon-HD   heparin  5,000 Units Subcutaneous Q8H   hydrALAZINE  100 mg Oral TID   insulin aspart  0-6 Units Subcutaneous TID WC   irbesartan  300 mg Oral Daily   isosorbide mononitrate  60 mg Oral Daily   labetalol  400 mg Oral TID   lidocaine  1 patch Transdermal Q24H   montelukast  10 mg Oral Q supper   NIFEdipine  60 mg Oral BID   pantoprazole  40 mg Oral Daily   predniSONE  5 mg Oral Q breakfast   sodium chloride flush  3 mL Intravenous Q12H   umeclidinium bromide  1 puff Inhalation Daily    Dialysis  Orders: [facility] on MWF 3:30hr, 400/600, EDW 62kg, 2K/2.5Ca, LUE AVF, no heparin - Epogen 20,000U TIW - Zemplar 8.46mcg IV q HD - Venofer 50mg  IV q week - HBsAb + 03/06/21   Assessment/Plan:  Dyspnea/cough: RLL pneumonia + COPD exacerbation + fluid overload component all contributing. 3.5L removed with HD on 9/11 - now below her prior dry weight. Remains on Ceftriaxone + Azithromycin. COVID/Flu/Viral panel negative.  ESRD:  Usual MWF schedule - next HD tomorrow (9/13). K low this AM - already given Kcl 71mEq - fine.  Hypertension/volume: BP very high, resume home meds and continue to challenge down her dry weight.  Anemia of ESRD: Hgb 8, on high dose ESA as outpatient, started Aranesp 283mcg weekly here.  Metabolic bone disease: CorrCa high on admit - holding VDRA for now. Phos low end.  Nutrition:  Alb low, continue protein supplements.  CAD/Hx CABG 2022/HFpEF  COPD/prior tobacco use: On baseline O2 2L/min + daily low-dose prednisone at home.  Veneta Penton, PA-C 10/22/2021, 10:43 AM  [employer]

## 2021-10-22 NOTE — Plan of Care (Signed)
  Problem: Education: Goal: Knowledge of disease and its progression will improve Outcome: Completed/Met

## 2021-10-22 NOTE — Progress Notes (Signed)
PROGRESS NOTE    Jodi Bell  GGY:[id number] DOB: 1960-04-25 DOA: 10/20/2021 PCP: [name]., MD   Brief Narrative:  This 61 years old female with PMH significant for diabetes, hypertension, ESRD on hemodialysis, OSA, atrial fibrillation, GERD, asthma, anemia, CAD s/p CABG, CHF presented with worsening shortness of breath.  Patient reports gradually worsening shortness of breath for last 2 weeks which has been little worse the last few days.  She uses 2 L of supplemental oxygen at baseline.  She has developed cough and concerned that she may have picked up infection from her granddaughter from school.  She did complete her last dialysis on Friday however she states she has missed 2 dialysis sessions last week due to vomiting and diarrhea which is improving.  Pertinent labs in the ED BNP more than 4500, COVID negative.  CTA chest ruled out PE but did show patchy consolidation of the right lower lobe suspicious for pneumonia.  Patient also found to have lymphadenopathy increased from previous but consistent with reactive.  Patient is admitted for acute on chronic hypoxic respiratory failure could be multifactorial.  Nephrology is consulted for continuation of hemodialysis.  Assessment & Plan:   Principal Problem:   Acute on chronic respiratory failure with hypoxia (HCC) Active Problems:   Diabetes mellitus with coincident hypertension (HCC)   Essential hypertension   GERD (gastroesophageal reflux disease)   Anemia   Asthma   ESRD (end stage renal disease) (HCC)   OSA (obstructive sleep apnea)   S/P CABG x 2   Chronic diastolic CHF (congestive heart failure) (HCC)   Coronary artery disease involving coronary bypass graft of native heart with angina pectoris (HCC)   Paroxysmal atrial fibrillation (HCC)   Volume overload  Acute on chronic hypoxic respiratory failure likely multifactorial. Patient with chronic diastolic CHF, presented with worsening shortness of breath, increased oxygen  requirement from baseline, increased pedal edema.  BNP above 4500 chronically elevated. Continue supplemental oxygen and wean as tolerated.  CTA chest ruled out PE but shows right lower lobe pneumonia. Continue empiric antibiotics (ceftriaxone and Zithromax) COVID-negative, influenza negative. Patient reports she has missed 2 dialysis sessions last week due to nausea, vomiting and diarrhea.   Nephrology is consulted for hemodialysis. Patient volume is being managed with dialysis. 3.5 L removed during HD 9/11. Last echocardiogram showed LVEF 65 to 70%. NO RWMA Continue with daily prednisone and as needed DuoNeb. Continue home Spiriva, Singulair and albuterol. Continue olmesartan and labetalol in the setting of CHF. Monitor daily intake, intake output charting.  Intake/Output Summary (Last 24 hours) at 10/22/2021 1240 Last data filed at 10/22/2021 0900 Gross per 24 hour  Intake 772.1 ml  Output 3500 ml  Net -2727.9 ml    Community-acquired pneumonia: Continue IV ceftriaxone and Zithromax. Continue supplemental oxygen.  Essential hypertension: Blood pressure now reasonably controlled. Continue olmesartan, hydralazine, Imdur, nifedipine and labetalol. Continue home clonidine dosing.  ESRD: Continue hemodialysis as per schedule.   Nephrology is consulted, next hemodialysis 10/23/2021  GERD: Continue pantoprazole 40 mg daily  CAD: S/p CABG.   Continue home olmesartan, labetalol and aspirin  Macrocytic anemia: No obvious visible bleeding.  Monitor H&H.   Transfuse if H&H below 7  OSA: Continue CPAP. She been noncompliant  Diabetes mellitus: Carb modified diet, hemoglobin A1c, Continue regular insulin sliding scale    DVT prophylaxis: Heparin Code Status: Full code Family Communication: No family at bedside Disposition Plan: Status is: Inpatient Remains inpatient appropriate because: Admitted for acute on chronic hypoxic  respiratory failure multifactorial requiring IV  antibiotics and hemodialysis.   Consultants:  Nephrology  Procedures: CTA chest Antimicrobials:  Anti-infectives (From admission, onward)    Start     Dose/Rate Route Frequency Ordered Stop   10/22/21 2100  azithromycin (ZITHROMAX) tablet 500 mg        500 mg Oral Every 24 hours 10/22/21 1219     10/20/21 1845  cefTRIAXone (ROCEPHIN) 1 g in sodium chloride 0.9 % 100 mL IVPB        1 g 200 mL/hr over 30 Minutes Intravenous Every 24 hours 10/20/21 1830     10/20/21 1845  azithromycin (ZITHROMAX) 500 mg in sodium chloride 0.9 % 250 mL IVPB  Status:  Discontinued        500 mg 250 mL/hr over 60 Minutes Intravenous Every 24 hours 10/20/21 1830 10/22/21 1218        Subjective: Patient was seen and examined at bedside.  Overnight events noted.  Patient reports she feels much improved after having dialysis yesterday. She still reports having cough and shortness of breath but much improved.  Objective: Vitals:   10/21/21 1812 10/21/21 2113 10/22/21 0500 10/22/21 0944  BP: (!) 162/69 (!) 151/65 (!) 148/62 (!) 157/72  Pulse: 70 72 73 62  Resp: 16 16 17 18   Temp: 99.1 F (37.3 C) 99 F (37.2 C) 98.1 F (36.7 C) 98.2 F (36.8 C)  TempSrc: Oral Oral Oral Oral  SpO2: 100% 96% 98% 100%  Weight:  57.7 kg 57.7 kg   Height:        Intake/Output Summary (Last 24 hours) at 10/22/2021 1240 Last data filed at 10/22/2021 0900 Gross per 24 hour  Intake 772.1 ml  Output 3500 ml  Net -2727.9 ml   Filed Weights   10/21/21 1744 10/21/21 2113 10/22/21 0500  Weight: 56.4 kg 57.7 kg 57.7 kg    Examination:  General exam: Appears comfortable, not in any acute distress.  Deconditioned Respiratory system: Decreased breath sounds, respiratory effort normal, no accessory muscle use. Cardiovascular system: S1 & S2 heard, regular rate and rhythm, no murmur. Gastrointestinal system: Abdomen is soft, non tender, non distended, BS+ Central nervous system: Alert and oriented x 3. No focal  neurological deficits. Extremities: Edema+ no cyanosis, no clubbing Skin: No rashes, lesions or ulcers Psychiatry: Judgement and insight appear normal. Mood & affect appropriate.     Data Reviewed: I have personally reviewed following labs and imaging studies  CBC: Recent Labs  Lab 10/20/21 1609 10/20/21 1738 10/21/21 0628 10/22/21 0221  WBC 5.8  --  4.1 4.6  NEUTROABS 3.2  --   --   --   HGB 9.2* 9.9* 8.5* 8.0*  HCT 28.4* 29.0* 24.9* 23.5*  MCV 104.8*  --  104.6* 105.4*  PLT 228  --  207 287   Basic Metabolic Panel: Recent Labs  Lab 10/20/21 1609 10/20/21 1738 10/21/21 0628 10/22/21 0221  NA 133* 132* 131* 134*  K 3.6 4.0 4.1 3.1*  CL 95*  --  95* 98  CO2 27  --  23 27  GLUCOSE 105*  --  193* 118*  BUN 22  --  25* 11  CREATININE 6.57*  --  7.61* 3.88*  CALCIUM 8.7*  --  9.2 8.5*  MG  --   --   --  1.5*  PHOS  --   --  4.2 2.5   GFR: Estimated Creatinine Clearance: 12 mL/min (A) (by C-G formula based on SCr of 3.88 mg/dL (H)).  Liver Function Tests: Recent Labs  Lab 10/20/21 1609 10/21/21 0628  AST 17  --   ALT 11  --   ALKPHOS 260*  --   BILITOT 0.7  --   PROT 7.0  --   ALBUMIN 3.3* 2.9*   No results for input(s): "LIPASE", "AMYLASE" in the last 168 hours. No results for input(s): "AMMONIA" in the last 168 hours. Coagulation Profile: Recent Labs  Lab 10/20/21 1609  INR 1.2   Cardiac Enzymes: No results for input(s): "CKTOTAL", "CKMB", "CKMBINDEX", "TROPONINI" in the last 168 hours. BNP (last 3 results) No results for input(s): "PROBNP" in the last 8760 hours. HbA1C: No results for input(s): "HGBA1C" in the last 72 hours. CBG: Recent Labs  Lab 10/21/21 1134 10/21/21 1815 10/21/21 2120 10/22/21 0735 10/22/21 1202  GLUCAP 110* 127* 138* 92 123*   Lipid Profile: No results for input(s): "CHOL", "HDL", "LDLCALC", "TRIG", "CHOLHDL", "LDLDIRECT" in the last 72 hours. Thyroid Function Tests: No results for input(s): "TSH", "T4TOTAL", "FREET4",  "T3FREE", "THYROIDAB" in the last 72 hours. Anemia Panel: No results for input(s): "VITAMINB12", "FOLATE", "FERRITIN", "TIBC", "IRON", "RETICCTPCT" in the last 72 hours. Sepsis Labs: No results for input(s): "PROCALCITON", "LATICACIDVEN" in the last 168 hours.  Recent Results (from the past 240 hour(s))  Resp Panel by RT-PCR (Flu A&B, Covid) Anterior Nasal Swab     Status: None   Collection Time: 10/20/21  4:00 PM   Specimen: Anterior Nasal Swab  Result Value Ref Range Status   SARS Coronavirus 2 by RT PCR NEGATIVE NEGATIVE Final    Comment: (NOTE) SARS-CoV-2 target nucleic acids are NOT DETECTED.  The SARS-CoV-2 RNA is generally detectable in upper respiratory specimens during the acute phase of infection. The lowest concentration of SARS-CoV-2 viral copies this assay can detect is 138 copies/mL. A negative result does not preclude SARS-Cov-2 infection and should not be used as the sole basis for treatment or other patient management decisions. A negative result may occur with  improper specimen collection/handling, submission of specimen other than nasopharyngeal swab, presence of viral mutation(s) within the areas targeted by this assay, and inadequate number of viral copies(<138 copies/mL). A negative result must be combined with clinical observations, patient history, and epidemiological information. The expected result is Negative.  Fact Sheet for Patients:  EntrepreneurPulse.com.au  Fact Sheet for Healthcare Providers:  IncredibleEmployment.be  This test is no t yet approved or cleared by the [place] FDA and  has been authorized for detection and/or diagnosis of SARS-CoV-2 by FDA under an Emergency Use Authorization (EUA). This EUA will remain  in effect (meaning this test can be used) for the duration of the COVID-19 declaration under Section 564(b)(1) of the Act, 21 U.S.C.section 360bbb-3(b)(1), unless the authorization is  terminated  or revoked sooner.       Influenza A by PCR NEGATIVE NEGATIVE Final   Influenza B by PCR NEGATIVE NEGATIVE Final    Comment: (NOTE) The Xpert Xpress SARS-CoV-2/FLU/RSV plus assay is intended as an aid in the diagnosis of influenza from Nasopharyngeal swab specimens and should not be used as a sole basis for treatment. Nasal washings and aspirates are unacceptable for Xpert Xpress SARS-CoV-2/FLU/RSV testing.  Fact Sheet for Patients: EntrepreneurPulse.com.au  Fact Sheet for Healthcare Providers: IncredibleEmployment.be  This test is not yet approved or cleared by the [place] FDA and has been authorized for detection and/or diagnosis of SARS-CoV-2 by FDA under an Emergency Use Authorization (EUA). This EUA will remain in effect (meaning this test can be  used) for the duration of the COVID-19 declaration under Section 564(b)(1) of the Act, 21 U.S.C. section 360bbb-3(b)(1), unless the authorization is terminated or revoked.  Performed at [facility], [address] [place], [place] [address]   Respiratory (~20 pathogens) panel by PCR     Status: None   Collection Time: 10/20/21 10:09 PM   Specimen: Nasopharyngeal Swab; Respiratory  Result Value Ref Range Status   Adenovirus NOT DETECTED NOT DETECTED Final   Coronavirus 229E NOT DETECTED NOT DETECTED Final    Comment: (NOTE) The Coronavirus on the Respiratory Panel, DOES NOT test for the novel  Coronavirus (2019 nCoV)    Coronavirus HKU1 NOT DETECTED NOT DETECTED Final   Coronavirus NL63 NOT DETECTED NOT DETECTED Final   Coronavirus OC43 NOT DETECTED NOT DETECTED Final   Metapneumovirus NOT DETECTED NOT DETECTED Final   Rhinovirus / Enterovirus NOT DETECTED NOT DETECTED Final   Influenza A NOT DETECTED NOT DETECTED Final   Influenza B NOT DETECTED NOT DETECTED Final   Parainfluenza Virus 1 NOT DETECTED NOT DETECTED Final   Parainfluenza Virus 2 NOT DETECTED  NOT DETECTED Final   Parainfluenza Virus 3 NOT DETECTED NOT DETECTED Final   Parainfluenza Virus 4 NOT DETECTED NOT DETECTED Final   Respiratory Syncytial Virus NOT DETECTED NOT DETECTED Final   Bordetella pertussis NOT DETECTED NOT DETECTED Final   Bordetella Parapertussis NOT DETECTED NOT DETECTED Final   Chlamydophila pneumoniae NOT DETECTED NOT DETECTED Final   Mycoplasma pneumoniae NOT DETECTED NOT DETECTED Final    Comment: Performed at [facility], [place]. [address], [place], [place] [address]   Radiology Studies: CT Angio Chest PE W/Cm &/Or Wo Cm  Result Date: 10/20/2021 CLINICAL DATA:  Pulmonary embolism suspected. Unknown D-dimer. Productive cough, intermittent diarrhea and nausea for 2 weeks. EXAM: CT ANGIOGRAPHY CHEST WITH CONTRAST TECHNIQUE: Multidetector CT imaging of the chest was performed using the standard protocol during bolus administration of intravenous contrast. Multiplanar CT image reconstructions and MIPs were obtained to evaluate the vascular anatomy. RADIATION DOSE REDUCTION: This exam was performed according to the departmental dose-optimization program which includes automated exposure control, adjustment of the mA and/or kV according to patient size and/or use of iterative reconstruction technique. CONTRAST:  75mL OMNIPAQUE IOHEXOL 350 MG/ML SOLN COMPARISON:  Chest CT dated 08/27/2021. chest CT dated 09/23/2020. FINDINGS: Cardiovascular: Some of the most peripheral segmental and subsegmental pulmonary artery branches can not be definitively characterized due to considerable patient breathing motion artifact, however, there is no pulmonary embolism identified within the main, lobar or central segmental pulmonary arteries bilaterally. Cardiomegaly. No substantial pericardial effusion. No thoracic aortic aneurysm or evidence of aortic dissection. Three-vessel coronary artery calcifications. Surgical changes of median sternotomy and CABG. Mediastinum/Nodes: Conglomerate  lymphadenopathy within the subcarinal space of the mediastinum, significantly increased compared to chest CT of 09/23/2020 and similar in appearance compared to more recent chest CT of 01/02/2021. Numerous additional small and moderately enlarged lymph nodes throughout the remainder of the mediastinum. Esophagus is unremarkable, where seen. Trachea and central bronchi are unremarkable. Lungs/Pleura: Patchy consolidations within the RIGHT lower lobe. Mild atelectasis at the LEFT lung base. Small RIGHT pleural effusion. No pneumothorax. Upper Abdomen: No acute findings. Musculoskeletal: No acute-appearing osseous abnormality. Review of the MIP images confirms the above findings. IMPRESSION: 1. Patchy consolidations within the RIGHT lower lobe, likely pneumonia. 2. Conglomerate lymphadenopathy within the subcarinal space of the mediastinum, significantly increased compared to chest CT of 09/23/2020 and similar in appearance compared to  more recent chest CT of 01/02/2021, differential considerations including benign reactive lymphadenopathy, metastatic lymphadenopathy and lymphoma. 3. No pulmonary embolism seen, with mild study limitations detailed above. 4. Marked cardiomegaly. Three-vessel coronary artery calcifications. Surgical changes of median sternotomy and CABG. 5. Small RIGHT pleural effusion. Electronically Signed   By: Franki Cabot M.D.   On: 10/20/2021 18:12   DG Chest 2 View  Result Date: 10/20/2021 CLINICAL DATA:  Cough 2 weeks with shortness of breath.  Hypoxia. EXAM: CHEST - 2 VIEW COMPARISON:  10/02/2021 FINDINGS: Lungs are adequately inflated with persistent hazy perihilar opacification compatible with mild vascular congestion/edema. No significant effusion. Moderate stable cardiomegaly. Remainder of the exam is unchanged. IMPRESSION: Moderate stable cardiomegaly with findings suggesting mild vascular congestion/edema. Electronically Signed   By: Marin Olp M.D.   On: 10/20/2021 16:31     Scheduled Meds:  (feeding supplement) PROSource Plus  30 mL Oral BID BM   aspirin EC  81 mg Oral Daily   azithromycin  500 mg Oral Q24H   Chlorhexidine Gluconate Cloth  6 each Topical Q0600   cloNIDine  0.3 mg Oral TID   darbepoetin (ARANESP) injection - DIALYSIS  200 mcg Intravenous Q Mon-HD   heparin  5,000 Units Subcutaneous Q8H   hydrALAZINE  100 mg Oral TID   insulin aspart  0-6 Units Subcutaneous TID WC   irbesartan  300 mg Oral Daily   isosorbide mononitrate  60 mg Oral Daily   labetalol  400 mg Oral TID   lidocaine  1 patch Transdermal Q24H   montelukast  10 mg Oral Q supper   NIFEdipine  60 mg Oral BID   pantoprazole  40 mg Oral Daily   predniSONE  5 mg Oral Q breakfast   sodium chloride flush  3 mL Intravenous Q12H   umeclidinium bromide  1 puff Inhalation Daily   Continuous Infusions:  cefTRIAXone (ROCEPHIN)  IV 1 g (10/22/21 0021)     LOS: 2 days    Time spent: 35 mins    Bryndle Corredor, MD [facility]   If 7PM-7AM, please contact night-coverage

## 2021-10-23 DIAGNOSIS — E8779 Other fluid overload: Secondary | ICD-10-CM | POA: Diagnosis not present

## 2021-10-23 DIAGNOSIS — J9621 Acute and chronic respiratory failure with hypoxia: Secondary | ICD-10-CM | POA: Diagnosis not present

## 2021-10-23 DIAGNOSIS — Z951 Presence of aortocoronary bypass graft: Secondary | ICD-10-CM | POA: Diagnosis not present

## 2021-10-23 DIAGNOSIS — I5033 Acute on chronic diastolic (congestive) heart failure: Secondary | ICD-10-CM

## 2021-10-23 DIAGNOSIS — I480 Paroxysmal atrial fibrillation: Secondary | ICD-10-CM | POA: Diagnosis not present

## 2021-10-23 LAB — GLUCOSE, CAPILLARY
Glucose-Capillary: 118 mg/dL — ABNORMAL HIGH (ref 70–99)
Glucose-Capillary: 150 mg/dL — ABNORMAL HIGH (ref 70–99)
Glucose-Capillary: 155 mg/dL — ABNORMAL HIGH (ref 70–99)

## 2021-10-23 LAB — BASIC METABOLIC PANEL
Anion gap: 8 (ref 5–15)
BUN: 7 mg/dL — ABNORMAL LOW (ref 8–23)
CO2: 30 mmol/L (ref 22–32)
Calcium: 9.1 mg/dL (ref 8.9–10.3)
Chloride: 97 mmol/L — ABNORMAL LOW (ref 98–111)
Creatinine, Ser: 2.67 mg/dL — ABNORMAL HIGH (ref 0.44–1.00)
GFR, Estimated: 20 mL/min — ABNORMAL LOW (ref >60)
Glucose, Bld: 156 mg/dL — ABNORMAL HIGH (ref 70–99)
Potassium: 4 mmol/L (ref 3.5–5.1)
Sodium: 135 mmol/L (ref 135–145)

## 2021-10-23 LAB — CBC
HCT: 28.4 % — ABNORMAL LOW (ref 36.0–46.0)
Hemoglobin: 9.2 g/dL — ABNORMAL LOW (ref 12.0–15.0)
MCH: 33.5 pg (ref 26.0–34.0)
MCHC: 32.4 g/dL (ref 30.0–36.0)
MCV: 103.3 fL — ABNORMAL HIGH (ref 80.0–100.0)
Platelets: 212 10*3/uL (ref 150–400)
RBC: 2.75 MIL/uL — ABNORMAL LOW (ref 3.87–5.11)
RDW: 14.9 % (ref 11.5–15.5)
WBC: 6.3 10*3/uL (ref 4.0–10.5)
nRBC: 0 % (ref 0.0–0.2)

## 2021-10-23 LAB — PHOSPHORUS: Phosphorus: 2.2 mg/dL — ABNORMAL LOW (ref 2.5–4.6)

## 2021-10-23 LAB — LEGIONELLA PNEUMOPHILA TOTAL AB: Legionella Pneumo Total Ab: <0.91 OD ratio (ref 0.00–0.90)

## 2021-10-23 LAB — HEPATITIS B SURFACE ANTIBODY,QUALITATIVE

## 2021-10-23 LAB — MAGNESIUM: Magnesium: 1.6 mg/dL — ABNORMAL LOW (ref 1.7–2.4)

## 2021-10-23 MED ORDER — BENZONATATE 100 MG PO CAPS
200.0000 mg | ORAL_CAPSULE | Freq: Three times a day (TID) | ORAL | Status: DC | PRN
  Administered 2021-10-23 – 2021-10-25 (×5): 200 mg via ORAL
  Filled 2021-10-23 (×7): qty 2

## 2021-10-23 MED ORDER — ONDANSETRON HCL 4 MG/2ML IJ SOLN
4.0000 mg | Freq: Four times a day (QID) | INTRAMUSCULAR | Status: DC | PRN
  Administered 2021-10-23 – 2021-10-24 (×3): 4 mg via INTRAVENOUS
  Filled 2021-10-23 (×3): qty 2

## 2021-10-23 MED ORDER — GERHARDT'S BUTT CREAM
TOPICAL_CREAM | Freq: Two times a day (BID) | CUTANEOUS | Status: DC
  Filled 2021-10-23: qty 1

## 2021-10-23 MED ORDER — ORAL CARE MOUTH RINSE: 15.0000 mL | OROMUCOSAL | Status: DC | PRN

## 2021-10-23 NOTE — Plan of Care (Signed)
  Problem: Education: ?Goal: Knowledge of General Education information will improve ?Description: Including pain rating scale, medication(s)/side effects and non-pharmacologic comfort measures ?Outcome: Completed/Met ?  ?Problem: Health Behavior/Discharge Planning: ?Goal: Ability to manage health-related needs will improve ?Outcome: Completed/Met ?  ?Problem: Clinical Measurements: ?Goal: Ability to maintain clinical measurements within normal limits will improve ?Outcome: Completed/Met ?Goal: Will remain free from infection ?Outcome: Completed/Met ?Goal: Diagnostic test results will improve ?Outcome: Completed/Met ?  ?

## 2021-10-23 NOTE — Procedures (Signed)
I was present at this dialysis session. I have reviewed the session itself and made appropriate changes.   On a 3K bath, UF goal 2.5L.  Still having cough but on home O2 2L [place]. Remains on CTX/Azithro for CAP.  Afebrile.  Filed Weights   10/21/21 2113 10/22/21 0500 10/23/21 0500  Weight: 57.7 kg 57.7 kg 59 kg    Recent Labs  Lab 10/22/21 0221  NA 134*  K 3.1*  CL 98  CO2 27  GLUCOSE 118*  BUN 11  CREATININE 3.88*  CALCIUM 8.5*  PHOS 2.5    Recent Labs  Lab 10/20/21 1609 10/20/21 1738 10/21/21 0628 10/22/21 0221  WBC 5.8  --  4.1 4.6  NEUTROABS 3.2  --   --   --   HGB 9.2* 9.9* 8.5* 8.0*  HCT 28.4* 29.0* 24.9* 23.5*  MCV 104.8*  --  104.6* 105.4*  PLT 228  --  207 206    Scheduled Meds:  (feeding supplement) PROSource Plus  30 mL Oral BID BM   aspirin EC  81 mg Oral Daily   azithromycin  500 mg Oral Q24H   cloNIDine  0.3 mg Oral TID   darbepoetin (ARANESP) injection - DIALYSIS  200 mcg Intravenous Q Mon-HD   heparin  5,000 Units Subcutaneous Q8H   hydrALAZINE  100 mg Oral TID   insulin aspart  0-6 Units Subcutaneous TID WC   irbesartan  300 mg Oral Daily   isosorbide mononitrate  60 mg Oral Daily   labetalol  400 mg Oral TID   lidocaine  1 patch Transdermal Q24H   montelukast  10 mg Oral Q supper   NIFEdipine  60 mg Oral BID   pantoprazole  40 mg Oral Daily   predniSONE  5 mg Oral Q breakfast   sodium chloride flush  3 mL Intravenous Q12H   umeclidinium bromide  1 puff Inhalation Daily   Continuous Infusions:  cefTRIAXone (ROCEPHIN)  IV 1 g (10/22/21 1840)   PRN Meds:.acetaminophen **OR** acetaminophen, albuterol, benzonatate, guaiFENesin, LORazepam, mouth rinse, oxyCODONE, polyethylene glycol, zolpidem   Pearson Grippe  MD 10/23/2021, 9:38 AM

## 2021-10-23 NOTE — Evaluation (Signed)
Physical Therapy Evaluation Patient Details Name: Jodi Bell MRN: [id number] DOB: 06/21/1960 Today's Date: 10/23/2021  History of Present Illness  Pt is a 61 y.o. F who presents with progressive shortness of breath. Admitted with acute on chronic hypoxic respiratory failure. CTA chest negative for PE but concerning for RLL infiltrate.  PMH includes: DM II, CAD s/p CABG in 2022, anxiety, arthritis, COPD, ESRD on HD, GERD, gout, obesity, PAD, and HTN.  Clinical Impression  PTA, pt lives with her family, uses a cane vs Rollator for ambulation and is independent with ADL's. Pt reports she drives self to HD. Pt is overall mobilizing fairly well, however, does report a change from her functional baseline. Pt ambulating 150 ft with a walker at a supervision level. SpO2 98% on 2L O2, HR 79-91. Would benefit from follow up PT to address cardiopulmonary endurance, strength, and balance.     Recommendations for follow up therapy are one component of a multi-disciplinary discharge planning process, led by the attending physician.  Recommendations may be updated based on patient status, additional functional criteria and insurance authorization.  Follow Up Recommendations Outpatient PT (pt reports already has referral)      Assistance Recommended at Discharge PRN  Patient can return home with the following  Assistance with cooking/housework    Equipment Recommendations None recommended by PT  Recommendations for Other Services       Functional Status Assessment Patient has had a recent decline in their functional status and demonstrates the ability to make significant improvements in function in a reasonable and predictable amount of time.     Precautions / Restrictions Precautions Precautions: Fall Restrictions Weight Bearing Restrictions: No      Mobility  Bed Mobility Overal bed mobility: Modified Independent                  Transfers Overall transfer level: Needs  assistance Equipment used: Rolling walker (2 wheels) Transfers: Sit to/from Stand Sit to Stand: Supervision           General transfer comment: distant supervision    Ambulation/Gait Ambulation/Gait assistance: Supervision Gait Distance (Feet): 150 Feet Assistive device: Rolling walker (2 wheels) Gait Pattern/deviations: Step-through pattern, Decreased stride length Gait velocity: decreased     General Gait Details: Overall slow and steady pace, no gross imbalance noted  Stairs            Wheelchair Mobility    Modified Rankin (Stroke Patients Only)       Balance Overall balance assessment: Mild deficits observed, not formally tested                                           Pertinent Vitals/Pain Pain Assessment Pain Assessment: No/denies pain    Home Living Family/patient expects to be discharged to:: Private residence Living Arrangements: Children Available Help at Discharge: Family;Available PRN/intermittently Type of Home: House Home Access: Level entry     Alternate Level Stairs-Number of Steps: 13 to kitchen Home Layout: Multi-level;Able to live on main level with bedroom/bathroom Home Equipment: BSC/3in1;Cane - single point;Rolling Walker (2 wheels);Rollator (4 wheels) Additional Comments: Lives with daughter and son-in-law (who works later in day), another daughter lives nearby; family can provide near 24/7 upon return home if needed    Prior Function Prior Level of Function : Driving;Independent/Modified Independent  Mobility Comments: Pt uses cane for household ambulation, Rollator for community ambulation. ADLs Comments: Family has been doing her laundry due to her having difficulty negotiating steps     Hand Dominance   Dominant Hand: Right    Extremity/Trunk Assessment   Upper Extremity Assessment Upper Extremity Assessment: Defer to OT evaluation    Lower Extremity Assessment Lower Extremity  Assessment: Overall WFL for tasks assessed    Cervical / Trunk Assessment Cervical / Trunk Assessment: Normal  Communication   Communication: No difficulties  Cognition Arousal/Alertness: Awake/alert Behavior During Therapy: WFL for tasks assessed/performed Overall Cognitive Status: Within Functional Limits for tasks assessed                                          General Comments      Exercises     Assessment/Plan    PT Assessment Patient needs continued PT services  PT Problem List Decreased strength;Decreased activity tolerance;Decreased balance;Decreased mobility       PT Treatment Interventions DME instruction;Gait training;Stair training;Functional mobility training;Therapeutic activities;Therapeutic exercise;Balance training;Patient/family education    PT Goals (Current goals can be found in the Care Plan section)  Acute Rehab PT Goals Patient Stated Goal: back to baseline PT Goal Formulation: With patient Time For Goal Achievement: 11/06/21 Potential to Achieve Goals: Good    Frequency Min 3X/week     Co-evaluation               AM-PAC PT "6 Clicks" Mobility  Outcome Measure Help needed turning from your back to your side while in a flat bed without using bedrails?: None Help needed moving from lying on your back to sitting on the side of a flat bed without using bedrails?: None Help needed moving to and from a bed to a chair (including a wheelchair)?: A Little Help needed standing up from a chair using your arms (e.g., wheelchair or bedside chair)?: A Little Help needed to walk in hospital room?: A Little Help needed climbing 3-5 steps with a railing? : A Little 6 Click Score: 20    End of Session Equipment Utilized During Treatment: Oxygen Activity Tolerance: Patient tolerated treatment well Patient left: in chair;with call bell/phone within reach Nurse Communication: Mobility status PT Visit Diagnosis: Difficulty in walking,  not elsewhere classified (R26.2)    Time: 5462-7035 PT Time Calculation (min) (ACUTE ONLY): 17 min   Charges:   PT Evaluation $PT Eval Moderate Complexity: 1 Mod          Wyona Almas, PT, DPT Acute Rehabilitation Services Office [phone]   Deno Etienne 10/23/2021, 5:01 PM

## 2021-10-23 NOTE — Progress Notes (Signed)
PROGRESS NOTE  Jodi Bell AST:[id number] DOB: [birth year]   PCP: [name]., MD  Patient is from: Home.  Lives with daughter.  Uses rolling walker and 2 L by [place] at baseline.  DOA: 10/20/2021 LOS: 3  Chief complaints Chief Complaint  Patient presents with   Cough     Brief Narrative / Interim history: 61 year old F with PMH of CAD/CABG, DM-2, HTN, ESRD on HD MWF, OSA on CPAP, asthma, GERD and anemia presenting with progressive shortness of breath for 2 weeks that has acutely gotten worse for 2 days prior to presentation.  She was admitted for acute on chronic respiratory failure with hypoxia in the setting of CHF exacerbation, CAP and COPD exacerbation.  Reportedly missed 2 HD sessions prior to her last HD.  BNP was elevated to 4500.  CTA chest negative for PE but showed patchy consolidation of RLL concerning for pneumonia.  Nephrology consulted.  Patient had dialysis with improvement.  Subjective: Seen and examined earlier this morning while on dialysis.  No major events overnight of this morning.  Patient is sleepy but wakes to voice easily.  She complains of productive cough but not able to spit up the phlegm.  Breathing is improved.  She denies chest pain or GI symptoms.  Objective: Vitals:   10/23/21 1130 10/23/21 1200 10/23/21 1230 10/23/21 1240  BP: (!) 150/67 (!) 149/72 (!) 162/68 (!) (P) 167/71  Pulse:    (P) 66  Resp: 14 15 15  (P) 14  Temp:    (P) 98.1 F (36.7 C)  TempSrc:      SpO2:      Weight:      Height:        Examination:  GENERAL: No apparent distress.  Nontoxic. HEENT: MMM.  Vision and hearing grossly intact.  NECK: Supple.  No apparent JVD.  RESP:  No IWOB.  Coughing during exam.  Rhonchorous. CVS:  RRR. Heart sounds normal.  ABD/GI/GU: BS+. Abd soft, NTND.  MSK/EXT:  Moves extremities. No apparent deformity. No edema.  SKIN: no apparent skin lesion or wound NEURO: Sleepy but wakes to voice.  Oriented x4.  No apparent focal neuro  deficit. PSYCH: Calm. Normal affect.   Assessment and plan: Principal Problem:   Acute on chronic respiratory failure with hypoxia (HCC) Active Problems:   Diabetes mellitus with coincident hypertension (HCC)   Essential hypertension   GERD (gastroesophageal reflux disease)   Anemia   Asthma   ESRD (end stage renal disease) (HCC)   OSA (obstructive sleep apnea)   S/P CABG x 2   Chronic diastolic CHF (congestive heart failure) (HCC)   Coronary artery disease involving coronary bypass graft of native heart with angina pectoris (HCC)   Paroxysmal atrial fibrillation (HCC)   Volume overload   Acute on chronic hypoxic respiratory failure: Seems to be on 2 L at baseline.  Multifactorial including COPD, diastolic CHF and COPD exacerbation.  CTA chest negative for PE but RLL infiltrate.  COVID-19 and influenza PCR nonreactive.  TTE with LVEF of 65 to 70% and no RWMA.  Probably missed 2 HD sessions prior to her last HD. -Continue antibiotic for CAP coverage -Continue inhalers and nebulizers for COPD -May consider increasing systemic steroid if no improvement.  Takes prednisone 5 mg daily at home. -HD for fluid management -Wean off oxygen as able -Ambulatory saturation -Mucolytic's and antitussive -Encourage incentive spirometry/OOB  Volume overload/acute on chronic diastolic CHF/ESRD on HD MWF: TTE as above.  BNP was elevated to 4500.  Improved. -Fluid management by dialysis   Community-acquired pneumonia: CTA chest negative for PE but concerning for RLL infiltrate. -Continue IV ceftriaxone and Zithromax. -Supportive care as above  Essential hypertension: BP elevated partly due to volume. -Continue clonidine, olmesartan, hydralazine, Imdur, nifedipine and labetalol.   GERD: -Continue pantoprazole 40 mg daily   CAD s/p CABG: Able. -Continue home olmesartan, labetalol and aspirin   Macrocytic anemia: Relatively stable. Recent Labs    07/20/21 2329 07/21/21 0926 07/22/21 0207  07/23/21 0241 07/23/21 1416 10/02/21 1032 10/20/21 1609 10/20/21 1738 10/21/21 0628 10/22/21 0221  HGB 7.4* 7.8* 8.2* 7.3* 7.7* 12.4 9.2* 9.9* 8.5* 8.0*  -Monitor -Transfuse for Hgb less than 8.0 given CAD -Check anemia panel in the morning   OSA on CPAP: Noncompliant with CPAP. Continue CPAP.    Controlled NIDDM-2 with hyperglycemia: A1c 5.2% on 8/4.  Does not seem to be on medication at home. Recent Labs  Lab 10/22/21 0735 10/22/21 1202 10/22/21 1635 10/22/21 2034 10/23/21 0809  GLUCAP 92 123* 172* 126* 118*  -Discontinue CBG monitoring and SSI  Generalized weakness/physical deconditioning.  She says she lives with daughter.  Uses rolling walker at baseline. -PT/OT eval  Body mass index is 23.79 kg/m.          DVT prophylaxis:  heparin injection 5,000 Units Start: 10/20/21 2300  Code Status: Full code Family Communication: None at bedside Level of care: Telemetry Medical Status is: Inpatient Remains inpatient appropriate because: Acute on chronic respiratory failure with hypoxia   Final disposition: Likely home Consultants:  Nephrology  Sch Meds:  Scheduled Meds:  (feeding supplement) PROSource Plus  30 mL Oral BID BM   aspirin EC  81 mg Oral Daily   azithromycin  500 mg Oral Q24H   cloNIDine  0.3 mg Oral TID   darbepoetin (ARANESP) injection - DIALYSIS  200 mcg Intravenous Q Mon-HD   Gerhardt's butt cream   Topical BID   heparin  5,000 Units Subcutaneous Q8H   hydrALAZINE  100 mg Oral TID   insulin aspart  0-6 Units Subcutaneous TID WC   irbesartan  300 mg Oral Daily   isosorbide mononitrate  60 mg Oral Daily   labetalol  400 mg Oral TID   lidocaine  1 patch Transdermal Q24H   montelukast  10 mg Oral Q supper   NIFEdipine  60 mg Oral BID   pantoprazole  40 mg Oral Daily   predniSONE  5 mg Oral Q breakfast   sodium chloride flush  3 mL Intravenous Q12H   umeclidinium bromide  1 puff Inhalation Daily   Continuous Infusions:  cefTRIAXone  (ROCEPHIN)  IV 1 g (10/22/21 1840)   PRN Meds:.acetaminophen **OR** acetaminophen, albuterol, benzonatate, guaiFENesin, LORazepam, mouth rinse, oxyCODONE, polyethylene glycol, zolpidem  Antimicrobials: Anti-infectives (From admission, onward)    Start     Dose/Rate Route Frequency Ordered Stop   10/22/21 2100  azithromycin (ZITHROMAX) tablet 500 mg        500 mg Oral Every 24 hours 10/22/21 1219     10/20/21 1845  cefTRIAXone (ROCEPHIN) 1 g in sodium chloride 0.9 % 100 mL IVPB        1 g 200 mL/hr over 30 Minutes Intravenous Every 24 hours 10/20/21 1830     10/20/21 1845  azithromycin (ZITHROMAX) 500 mg in sodium chloride 0.9 % 250 mL IVPB  Status:  Discontinued        500 mg 250 mL/hr over 60 Minutes Intravenous Every 24 hours 10/20/21 1830 10/22/21 1218  I have personally reviewed the following labs and images: CBC: Recent Labs  Lab 10/20/21 1609 10/20/21 1738 10/21/21 0628 10/22/21 0221  WBC 5.8  --  4.1 4.6  NEUTROABS 3.2  --   --   --   HGB 9.2* 9.9* 8.5* 8.0*  HCT 28.4* 29.0* 24.9* 23.5*  MCV 104.8*  --  104.6* 105.4*  PLT 228  --  207 206   BMP &GFR Recent Labs  Lab 10/20/21 1609 10/20/21 1738 10/21/21 0628 10/22/21 0221  NA 133* 132* 131* 134*  K 3.6 4.0 4.1 3.1*  CL 95*  --  95* 98  CO2 27  --  23 27  GLUCOSE 105*  --  193* 118*  BUN 22  --  25* 11  CREATININE 6.57*  --  7.61* 3.88*  CALCIUM 8.7*  --  9.2 8.5*  MG  --   --   --  1.5*  PHOS  --   --  4.2 2.5   Estimated Creatinine Clearance: 12 mL/min (A) (by C-G formula based on SCr of 3.88 mg/dL (H)). Liver & Pancreas: Recent Labs  Lab 10/20/21 1609 10/21/21 0628  AST 17  --   ALT 11  --   ALKPHOS 260*  --   BILITOT 0.7  --   PROT 7.0  --   ALBUMIN 3.3* 2.9*   No results for input(s): "LIPASE", "AMYLASE" in the last 168 hours. No results for input(s): "AMMONIA" in the last 168 hours. Diabetic: No results for input(s): "HGBA1C" in the last 72 hours. Recent Labs  Lab 10/22/21 0735  10/22/21 1202 10/22/21 1635 10/22/21 2034 10/23/21 0809  GLUCAP 92 123* 172* 126* 118*   Cardiac Enzymes: No results for input(s): "CKTOTAL", "CKMB", "CKMBINDEX", "TROPONINI" in the last 168 hours. No results for input(s): "PROBNP" in the last 8760 hours. Coagulation Profile: Recent Labs  Lab 10/20/21 1609  INR 1.2   Thyroid Function Tests: No results for input(s): "TSH", "T4TOTAL", "FREET4", "T3FREE", "THYROIDAB" in the last 72 hours. Lipid Profile: No results for input(s): "CHOL", "HDL", "LDLCALC", "TRIG", "CHOLHDL", "LDLDIRECT" in the last 72 hours. Anemia Panel: No results for input(s): "VITAMINB12", "FOLATE", "FERRITIN", "TIBC", "IRON", "RETICCTPCT" in the last 72 hours. Urine analysis:    Component Value Date/Time   COLORURINE YELLOW 09/12/2020 2333   APPEARANCEUR HAZY (A) 09/12/2020 2333   LABSPEC 1.012 09/12/2020 2333   PHURINE 8.0 09/12/2020 2333   GLUCOSEU NEGATIVE 09/12/2020 2333   HGBUR SMALL (A) 09/12/2020 2333   BILIRUBINUR NEGATIVE 09/12/2020 2333   KETONESUR NEGATIVE 09/12/2020 2333   PROTEINUR 100 (A) 09/12/2020 2333   UROBILINOGEN 0.2 09/05/2014 0013   NITRITE NEGATIVE 09/12/2020 2333   LEUKOCYTESUR NEGATIVE 09/12/2020 2333   Sepsis Labs: Invalid input(s): "PROCALCITONIN", "LACTICIDVEN"  Microbiology: Recent Results (from the past 240 hour(s))  Resp Panel by RT-PCR (Flu A&B, Covid) Anterior Nasal Swab     Status: None   Collection Time: 10/20/21  4:00 PM   Specimen: Anterior Nasal Swab  Result Value Ref Range Status   SARS Coronavirus 2 by RT PCR NEGATIVE NEGATIVE Final    Comment: (NOTE) SARS-CoV-2 target nucleic acids are NOT DETECTED.  The SARS-CoV-2 RNA is generally detectable in upper respiratory specimens during the acute phase of infection. The lowest concentration of SARS-CoV-2 viral copies this assay can detect is 138 copies/mL. A negative result does not preclude SARS-Cov-2 infection and should not be used as the sole basis for  treatment or other patient management decisions. A negative result may occur with  improper specimen collection/handling, submission of specimen other than nasopharyngeal swab, presence of viral mutation(s) within the areas targeted by this assay, and inadequate number of viral copies(<138 copies/mL). A negative result must be combined with clinical observations, patient history, and epidemiological information. The expected result is Negative.  Fact Sheet for Patients:  EntrepreneurPulse.com.au  Fact Sheet for Healthcare Providers:  IncredibleEmployment.be  This test is no t yet approved or cleared by the [place] FDA and  has been authorized for detection and/or diagnosis of SARS-CoV-2 by FDA under an Emergency Use Authorization (EUA). This EUA will remain  in effect (meaning this test can be used) for the duration of the COVID-19 declaration under Section 564(b)(1) of the Act, 21 U.S.C.section 360bbb-3(b)(1), unless the authorization is terminated  or revoked sooner.       Influenza A by PCR NEGATIVE NEGATIVE Final   Influenza B by PCR NEGATIVE NEGATIVE Final    Comment: (NOTE) The Xpert Xpress SARS-CoV-2/FLU/RSV plus assay is intended as an aid in the diagnosis of influenza from Nasopharyngeal swab specimens and should not be used as a sole basis for treatment. Nasal washings and aspirates are unacceptable for Xpert Xpress SARS-CoV-2/FLU/RSV testing.  Fact Sheet for Patients: EntrepreneurPulse.com.au  Fact Sheet for Healthcare Providers: IncredibleEmployment.be  This test is not yet approved or cleared by the [place] FDA and has been authorized for detection and/or diagnosis of SARS-CoV-2 by FDA under an Emergency Use Authorization (EUA). This EUA will remain in effect (meaning this test can be used) for the duration of the COVID-19 declaration under Section 564(b)(1) of the Act, 21  U.S.C. section 360bbb-3(b)(1), unless the authorization is terminated or revoked.  Performed at [facility], [address] [place], [place] [address]   Respiratory (~20 pathogens) panel by PCR     Status: None   Collection Time: 10/20/21 10:09 PM   Specimen: Nasopharyngeal Swab; Respiratory  Result Value Ref Range Status   Adenovirus NOT DETECTED NOT DETECTED Final   Coronavirus 229E NOT DETECTED NOT DETECTED Final    Comment: (NOTE) The Coronavirus on the Respiratory Panel, DOES NOT test for the novel  Coronavirus (2019 nCoV)    Coronavirus HKU1 NOT DETECTED NOT DETECTED Final   Coronavirus NL63 NOT DETECTED NOT DETECTED Final   Coronavirus OC43 NOT DETECTED NOT DETECTED Final   Metapneumovirus NOT DETECTED NOT DETECTED Final   Rhinovirus / Enterovirus NOT DETECTED NOT DETECTED Final   Influenza A NOT DETECTED NOT DETECTED Final   Influenza B NOT DETECTED NOT DETECTED Final   Parainfluenza Virus 1 NOT DETECTED NOT DETECTED Final   Parainfluenza Virus 2 NOT DETECTED NOT DETECTED Final   Parainfluenza Virus 3 NOT DETECTED NOT DETECTED Final   Parainfluenza Virus 4 NOT DETECTED NOT DETECTED Final   Respiratory Syncytial Virus NOT DETECTED NOT DETECTED Final   Bordetella pertussis NOT DETECTED NOT DETECTED Final   Bordetella Parapertussis NOT DETECTED NOT DETECTED Final   Chlamydophila pneumoniae NOT DETECTED NOT DETECTED Final   Mycoplasma pneumoniae NOT DETECTED NOT DETECTED Final    Comment: Performed at [facility], [place]. [address], [place], [place] [address]    Radiology Studies: No results found.    Io [employer]ujust[employer] T[employer] [place]  If 7PM-7AM, please contact night-coverage [email or web address] 10/23/2021, 2:26 PM

## 2021-10-24 ENCOUNTER — Other Ambulatory Visit (HOSPITAL_COMMUNITY): Payer: Self-pay

## 2021-10-24 DIAGNOSIS — E8779 Other fluid overload: Secondary | ICD-10-CM | POA: Diagnosis not present

## 2021-10-24 DIAGNOSIS — I480 Paroxysmal atrial fibrillation: Secondary | ICD-10-CM | POA: Diagnosis not present

## 2021-10-24 DIAGNOSIS — J9621 Acute and chronic respiratory failure with hypoxia: Secondary | ICD-10-CM | POA: Diagnosis not present

## 2021-10-24 DIAGNOSIS — J441 Chronic obstructive pulmonary disease with (acute) exacerbation: Secondary | ICD-10-CM

## 2021-10-24 DIAGNOSIS — Z951 Presence of aortocoronary bypass graft: Secondary | ICD-10-CM | POA: Diagnosis not present

## 2021-10-24 LAB — GLUCOSE, CAPILLARY
Glucose-Capillary: 95 mg/dL (ref 70–99)
Glucose-Capillary: 147 mg/dL — ABNORMAL HIGH (ref 70–99)
Glucose-Capillary: 263 mg/dL — ABNORMAL HIGH (ref 70–99)
Glucose-Capillary: 203 mg/dL — ABNORMAL HIGH (ref 70–99)

## 2021-10-24 MED ORDER — METHYLPREDNISOLONE SODIUM SUCC 40 MG IJ SOLR
40.0000 mg | Freq: Two times a day (BID) | INTRAMUSCULAR | Status: DC
  Administered 2021-10-24 – 2021-10-25 (×2): 40 mg via INTRAVENOUS
  Filled 2021-10-24 (×2): qty 1

## 2021-10-24 MED ORDER — STIOLTO RESPIMAT 2.5-2.5 MCG/ACT IN AERS
2.0000 | INHALATION_SPRAY | Freq: Every day | RESPIRATORY_TRACT | 1 refills | Status: DC
  Filled 2021-10-24: qty 4, 30d supply, fill #0

## 2021-10-24 MED ORDER — METHYLPREDNISOLONE SODIUM SUCC 125 MG IJ SOLR
125.0000 mg | Freq: Once | INTRAMUSCULAR | Status: AC
  Administered 2021-10-24: 125 mg via INTRAVENOUS
  Filled 2021-10-24: qty 2

## 2021-10-24 MED ORDER — BUDESONIDE-FORMOTEROL FUMARATE 160-4.5 MCG/ACT IN AERO
2.0000 | INHALATION_SPRAY | Freq: Two times a day (BID) | RESPIRATORY_TRACT | 12 refills | Status: DC
  Filled 2021-10-24: qty 10.2, 30d supply, fill #0

## 2021-10-24 MED ORDER — BENZONATATE 100 MG PO CAPS
100.0000 mg | ORAL_CAPSULE | Freq: Four times a day (QID) | ORAL | 0 refills | Status: AC | PRN
  Filled 2021-10-24: qty 30, 8d supply, fill #0

## 2021-10-24 MED ORDER — STIOLTO RESPIMAT 2.5-2.5 MCG/ACT IN AERS
INHALATION_SPRAY | RESPIRATORY_TRACT | 0 refills | Status: DC
  Filled 2021-10-24: qty 4, 30d supply, fill #0

## 2021-10-24 NOTE — Evaluation (Signed)
Occupational Therapy Evaluation Patient Details Name: Jodi Bell MRN: [id number] DOB: 03/09/1960 Today's Date: 10/24/2021   History of Present Illness Pt is a 61 y.o. F who presents with progressive shortness of breath. Admitted with acute on chronic hypoxic respiratory failure. CTA chest negative for PE but concerning for RLL infiltrate.  PMH includes: DM II, CAD s/p CABG in 2022, anxiety, arthritis, COPD, ESRD on HD, GERD, gout, obesity, PAD, and HTN.   Clinical Impression   Pt functions modified independently at home in ADLs and light IADLs. Pt has been routinely ambulating to the bathroom and performing basic ADLs without assistance and managing her 02 line. No further OT needs.      Recommendations for follow up therapy are one component of a multi-disciplinary discharge planning process, led by the attending physician.  Recommendations may be updated based on patient status, additional functional criteria and insurance authorization.   Follow Up Recommendations  No OT follow up    Assistance Recommended at Discharge Intermittent Supervision/Assistance  Patient can return home with the following Assistance with cooking/housework;Help with stairs or ramp for entrance    Functional Status Assessment  Patient has had a recent decline in their functional status and demonstrates the ability to make significant improvements in function in a reasonable and predictable amount of time.  Equipment Recommendations  None recommended by OT    Recommendations for Other Services       Precautions / Restrictions Precautions Precautions: Fall Restrictions Weight Bearing Restrictions: No      Mobility Bed Mobility Overal bed mobility: Modified Independent             General bed mobility comments: HOB up    Transfers Overall transfer level: Modified independent Equipment used: None               General transfer comment: has been routinely walking about her room and  to bathroom independently      Balance Overall balance assessment: Mild deficits observed, not formally tested                                         ADL either performed or assessed with clinical judgement   ADL Overall ADL's : At baseline                                             Vision Ability to See in Adequate Light: 0 Adequate Patient Visual Report: No change from baseline       Perception     Praxis      Pertinent Vitals/Pain Pain Assessment Pain Assessment: No/denies pain     Hand Dominance Right   Extremity/Trunk Assessment Upper Extremity Assessment Upper Extremity Assessment: Overall WFL for tasks assessed   Lower Extremity Assessment Lower Extremity Assessment: Defer to PT evaluation   Cervical / Trunk Assessment Cervical / Trunk Assessment: Normal   Communication Communication Communication: No difficulties   Cognition Arousal/Alertness: Awake/alert Behavior During Therapy: Flat affect Overall Cognitive Status: Within Functional Limits for tasks assessed                                       General Comments  Exercises     Shoulder Instructions      Home Living Family/patient expects to be discharged to:: Private residence Living Arrangements: Children Available Help at Discharge: Family;Available PRN/intermittently Type of Home: House Home Access: Level entry     Home Layout: Multi-level;Able to live on main level with bedroom/bathroom Alternate Level Stairs-Number of Steps: 13 to kitchen Alternate Level Stairs-Rails: Left Bathroom Shower/Tub: Teacher, early years/pre: Standard     Home Equipment: BSC/3in1;Cane - single point;Rolling Walker (2 wheels);Rollator (4 wheels)   Additional Comments: Lives with daughter and son-in-law (who works later in day), another daughter lives nearby; family can provide near 24/7 upon return home if needed      Prior  Functioning/Environment Prior Level of Function : Driving;Independent/Modified Independent             Mobility Comments: Pt uses cane for household ambulation, Rollator for community ambulation. ADLs Comments: Family has been doing her laundry due to her having difficulty negotiating steps        OT Problem List:        OT Treatment/Interventions:      OT Goals(Current goals can be found in the care plan section)    OT Frequency:      Co-evaluation              AM-PAC OT "6 Clicks" Daily Activity     Outcome Measure Help from another person eating meals?: None Help from another person taking care of personal grooming?: None Help from another person toileting, which includes using toliet, bedpan, or urinal?: None Help from another person bathing (including washing, rinsing, drying)?: None Help from another person to put on and taking off regular upper body clothing?: None Help from another person to put on and taking off regular lower body clothing?: None 6 Click Score: 24   End of Session    Activity Tolerance: Patient tolerated treatment well Patient left: in chair;with call bell/phone within reach;with nursing/sitter in room  OT Visit Diagnosis: Muscle weakness (generalized) (M62.81)                Time: 8676-1950 OT Time Calculation (min): 21 min Charges:  OT Evaluation $OT Eval Low Complexity: Bishop M, OTR/L Acute Rehabilitation Services Office: [phone]  Malka So 10/24/2021, 11:03 AM

## 2021-10-24 NOTE — TOC Progression Note (Signed)
Transition of Care [facility]) - Progression Note    Patient Details  Name: Jodi Bell MRN: [id number] Date of Birth: Jun 19, 1960  [facility] CM/SW Contact  Tom-Johnson, Renea Ee, RN Phone Number: 10/24/2021, 3:12 PM  Clinical Narrative:     Outpatient PT recommended. Patient states she is active with [facility].CM called and spoke with Junie Panning [phone]). Junie Panning states patient is active and needs to call them when she is discharged to schedule appointment. CM notified patient. CM will continue to follow with needs.      Expected Discharge Plan: Home/Self Care Barriers to Discharge: Continued Medical Work up  Expected Discharge Plan and Services Expected Discharge Plan: Home/Self Care   Discharge Planning Services: CM Consult Post Acute Care Choice: NA Living arrangements for the past 2 months: Single Family Home Expected Discharge Date: 10/24/21               DME Arranged: N/A DME Agency: NA       HH Arranged: NA HH Agency: NA         Social Determinants of Health (SDOH) Interventions    Readmission Risk Interventions    07/23/2021    3:40 PM 06/28/2021    5:10 PM  Readmission Risk Prevention Plan  Transportation Screening Complete Complete  Medication Review Press photographer) Complete Complete  PCP or Specialist appointment within 3-5 days of discharge Complete   HRI or [place] Complete Complete  SW Recovery Care/Counseling Consult Complete   Palliative Care Screening Not Applicable Not [place] Not Applicable Not Applicable

## 2021-10-24 NOTE — Progress Notes (Signed)
[facility] Progress Note   Subjective:   Seen in room - still feeling very congested and weak, but dyspnea resolved. Looks like may be going home today. No CP or fever.  Objective Vitals:   10/23/21 2014 10/24/21 0421 10/24/21 0818 10/24/21 0905  BP:  (!) 160/67  (!) 197/63  Pulse:  64  66  Resp:  20  18  Temp:  98.7 F (37.1 C)  98.4 F (36.9 C)  TempSrc:  Oral  Oral  SpO2:  98% 97% 100%  Weight: 56.9 kg     Height:       Physical Exam General: Well appearing woman, NAD. Nasal O2 in place Heart: RRR; 2/6 murmur Lungs: Diffuse wheezing in all lung fields again, no rales Abdomen: soft Extremities: No LE edema Dialysis Access: LUE AVF + bruit  Additional Objective Labs: Basic Metabolic Panel: Recent Labs  Lab 10/21/21 0628 10/22/21 0221 10/23/21 1505  NA 131* 134* 135  K 4.1 3.1* 4.0  CL 95* 98 97*  CO2 23 27 30   GLUCOSE 193* 118* 156*  BUN 25* 11 7*  CREATININE 7.61* 3.88* 2.67*  CALCIUM 9.2 8.5* 9.1  PHOS 4.2 2.5 2.2*   Liver Function Tests: Recent Labs  Lab 10/20/21 1609 10/21/21 0628  AST 17  --   ALT 11  --   ALKPHOS 260*  --   BILITOT 0.7  --   PROT 7.0  --   ALBUMIN 3.3* 2.9*   CBC: Recent Labs  Lab 10/20/21 1609 10/20/21 1738 10/21/21 0628 10/22/21 0221 10/23/21 1505  WBC 5.8  --  4.1 4.6 6.3  NEUTROABS 3.2  --   --   --   --   HGB 9.2*   < > 8.5* 8.0* 9.2*  HCT 28.4*   < > 24.9* 23.5* 28.4*  MCV 104.8*  --  104.6* 105.4* 103.3*  PLT 228  --  207 206 212   < > = values in this interval not displayed.   CBG: Recent Labs  Lab 10/22/21 2034 10/23/21 0809 10/23/21 1743 10/23/21 2012 10/24/21 0715  GLUCAP 126* 118* 150* 155* 95   Medications:  cefTRIAXone (ROCEPHIN)  IV 1 g (10/23/21 1745)    (feeding supplement) PROSource Plus  30 mL Oral BID BM   aspirin EC  81 mg Oral Daily   azithromycin  500 mg Oral Q24H   cloNIDine  0.3 mg Oral TID   darbepoetin (ARANESP) injection - DIALYSIS  200 mcg Intravenous Q  Mon-HD   Gerhardt's butt cream   Topical BID   heparin  5,000 Units Subcutaneous Q8H   hydrALAZINE  100 mg Oral TID   insulin aspart  0-6 Units Subcutaneous TID WC   irbesartan  300 mg Oral Daily   isosorbide mononitrate  60 mg Oral Daily   labetalol  400 mg Oral TID   lidocaine  1 patch Transdermal Q24H   montelukast  10 mg Oral Q supper   NIFEdipine  60 mg Oral BID   pantoprazole  40 mg Oral Daily   predniSONE  5 mg Oral Q breakfast   sodium chloride flush  3 mL Intravenous Q12H   umeclidinium bromide  1 puff Inhalation Daily    Dialysis Orders: [facility] on MWF 3:30hr, 400/600, EDW 62kg, 2K/2.5Ca, LUE AVF, no heparin - Epogen 20,000U TIW - Zemplar 8.64mcg IV q HD - Venofer 50mg  IV q week - HBsAb + 03/06/21   Assessment/Plan:  Dyspnea/cough: RLL pneumonia + COPD exacerbation +  fluid overload component all contributing. Extra fluid pulled with HD, now well below her prior EDW. Remains on Ceftriaxone + Azithromycin. COVID/Flu/Viral panel negative. Wheezing a little worse today, likely needs neb treatment prior to discharge.  ESRD:  Usual MWF schedule - next HD tomorrow (9/15), likely as outpatient.  Hypertension/volume: BP very high, resume home meds and continue to challenge down her dry weight.  Anemia of ESRD: Hgb 9.2, on high dose ESA as outpatient, started Aranesp 285mcg weekly here.  Metabolic bone disease: CorrCa high on admit - holding VDRA for now. Phos low - no binders.  Nutrition:  Alb low, continue protein supplements.  CAD/Hx CABG 2022/HFpEF  COPD/prior tobacco use: On baseline O2 2L/min + daily low-dose prednisone at home.  Veneta Penton, PA-C 10/24/2021, 9:53 AM  [employer]

## 2021-10-24 NOTE — Plan of Care (Signed)
  Problem: Clinical Measurements: Goal: Cardiovascular complication will be avoided Outcome: Progressing   Problem: Activity: Goal: Risk for activity intolerance will decrease Outcome: Progressing   Problem: Nutrition: Goal: Adequate nutrition will be maintained Outcome: Progressing   

## 2021-10-24 NOTE — Progress Notes (Signed)
Pt for likely d/c tomorrow. Attending and nephrologist agreeable to d/c tomorrow morning for pt to receive out-pt HD at [facility] in [facility] tomorrow 2nd shift. Spoke to [employer] at [facility] who states that clinic has an 11:25 chair time available. Met with pt at bedside to discuss d/c tomorrow morning for pt to receive out-pt HD at her out-pt clinic. Pt agreeable to plan and states family will be here around 9:30 tomorrow morning to transport pt to home and/or clinic (pt states she may drive herself to clinic). Update provided to attending, nephrologist, renal PA, pt's RN, and RN CM. Spoke to Inpt HD staff to request that pt be placed on 2nd shift tomorrow in the event pt unable to d/c home tomorrow morning. Spoke to [employer] at [facility] to make her aware of plans for pt to d/c tomorrow morning and be at clinic for 11:25 chair time. Will add to AVS as well.   Melven Sartorius Renal Navigator [phone]

## 2021-10-24 NOTE — Progress Notes (Signed)
PROGRESS NOTE  Jodi Bell WCH:[id number] DOB: Dec 17, 1960   PCP: [name]., MD  Patient is from: Home.  Lives with daughter.  Uses rolling walker and 2 L by [place] at baseline.  DOA: 10/20/2021 LOS: 4  Chief complaints Chief Complaint  Patient presents with   Cough     Brief Narrative / Interim history: 61 year old F with PMH of CAD/CABG, DM-2, HTN, ESRD on HD MWF, OSA on CPAP, asthma, GERD and anemia presenting with progressive shortness of breath for 2 weeks that has acutely gotten worse for 2 days prior to presentation.  She was admitted for acute on chronic respiratory failure with hypoxia in the setting of CHF exacerbation, CAP and COPD exacerbation.  Reportedly missed 2 HD sessions prior to her last HD.  BNP was elevated to 4500.  CTA chest negative for PE but showed patchy consolidation of RLL concerning for pneumonia.  Nephrology consulted.  Some improvement in respiratory status with dialysis and antibiotics.  However, continued to have significant cough and rhonchi.  Started on systemic steroid.  Subjective: Seen and examined earlier this morning.  She states she does not feel good.  When asked to elaborate, she is still coughing.  Feels weak and tired.  She also feels nauseous.  She says she had emesis yesterday.  Very anxious to go home yet.  Objective: Vitals:   10/24/21 0421 10/24/21 0818 10/24/21 0905 10/24/21 1522  BP: (!) 160/67  (!) 197/63 (!) 160/75  Pulse: 64  66 70  Resp: 20  18   Temp: 98.7 F (37.1 C)  98.4 F (36.9 C)   TempSrc: Oral  Oral   SpO2: 98% 97% 100%   Weight:      Height:        Examination:  GENERAL: No apparent distress.  Nontoxic. HEENT: MMM.  Vision and hearing grossly intact.  NECK: Supple.  No apparent JVD.  RESP: 97% on 2 L.  Significant coughing during exam.  Rhonchorous. CVS:  RRR. Heart sounds normal.  ABD/GI/GU: BS+. Abd soft, NTND.  MSK/EXT:  Moves extremities. No apparent deformity. No edema.  SKIN: no apparent skin  lesion or wound NEURO: Awake and alert. Oriented appropriately.  No apparent focal neuro deficit. PSYCH: Calm. Normal affect.   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  Multifactorial including pneumonia, diastolic CHF and COPD exacerbation.  Missed 2 HD lesions prior to her last outpatient HD.  CTA chest negative for PE but RLL infiltrate.  COVID-19 and influenza PCR nonreactive.  TTE with LVEF of 65 to 70% and no RWMA.  Still with significant cough and rhonchi on exam. -Continue antibiotic for CAP coverage to complete 5 days course -Start IV Solu-Medrol 125 mg once followed by 40 mg twice daily.  Hold home prednisone -Continue inhalers and nebulizers for COPD -HD for fluid management -Encouraged incentive spirometry, OOB and ambulation. -  Ambulatory saturation assessment -Mucolytic's and antitussive  Volume overload/acute on chronic diastolic CHF/ESRD on HD MWF: TTE as above.  BNP was elevated to 4500.  Improved. -Fluid management by dialysis   Community-acquired pneumonia: CTA chest negative for PE but concerning for RLL infiltrate. -Continue IV ceftriaxone and Zithromax to complete 5 days course. -Supportive care as above  Essential hypertension: BP elevated partly due to volume. -Continue clonidine, olmesartan, hydralazine, Imdur, nifedipine and labetalol.   GERD: -Continue  pantoprazole 40 mg daily   CAD s/p CABG: Able. -Continue home olmesartan, labetalol and aspirin   Macrocytic anemia: Relatively stable. Recent Labs    07/21/21 0926 07/22/21 0207 07/23/21 0241 07/23/21 1416 10/02/21 1032 10/20/21 1609 10/20/21 1738 10/21/21 0628 10/22/21 0221 10/23/21 1505  HGB 7.8* 8.2* 7.3* 7.7* 12.4 9.2* 9.9* 8.5* 8.0* 9.2*  -Monitor and transfuse for Hgb less than 8.0 given CAD   OSA on CPAP: Noncompliant with CPAP. Continue CPAP.    Controlled NIDDM-2 with hyperglycemia: A1c 5.2% on 8/4.  Does not seem to be on medication at home. Recent Labs  Lab 10/23/21 1743 10/23/21 2012 10/24/21 0715 10/24/21 1140 10/24/21 1651  GLUCAP 150* 155* 95 147* 263*  -Discontinued CBG monitoring and SSI  Generalized weakness/physical deconditioning.  She says she lives with daughter.  Uses rolling walker at baseline.  Therapy recommended outpatient PT. per TOC, patient has referral in place  Body mass index is 22.94 kg/m.          DVT prophylaxis:  heparin injection 5,000 Units Start: 10/20/21 2300  Code Status: Full code Family Communication: None at bedside Level of care: Telemetry Medical Status is: Inpatient Remains inpatient appropriate because: Acute on chronic respiratory failure with respiratory distress   Final disposition: Likely home on 9/15 Consultants:  Nephrology  Sch Meds:  Scheduled Meds:  (feeding supplement) PROSource Plus  30 mL Oral BID BM   aspirin EC  81 mg Oral Daily   azithromycin  500 mg Oral Q24H   cloNIDine  0.3 mg Oral TID   darbepoetin (ARANESP) injection - DIALYSIS  200 mcg Intravenous Q Mon-HD   Gerhardt's butt cream   Topical BID   heparin  5,000 Units Subcutaneous Q8H   hydrALAZINE  100 mg Oral TID   insulin aspart  0-6 Units Subcutaneous TID WC   irbesartan  300 mg Oral Daily   isosorbide mononitrate  60 mg Oral Daily   labetalol  400 mg Oral TID   lidocaine  1 patch Transdermal Q24H   methylPREDNISolone  (SOLU-MEDROL) injection  40 mg Intravenous Q12H   montelukast  10 mg Oral Q supper   NIFEdipine  60 mg Oral BID   pantoprazole  40 mg Oral Daily   sodium chloride flush  3 mL Intravenous Q12H   umeclidinium bromide  1 puff Inhalation Daily   Continuous Infusions:  cefTRIAXone (ROCEPHIN)  IV Stopped (10/24/21 1749)   PRN Meds:.acetaminophen **OR** acetaminophen, albuterol, benzonatate, guaiFENesin, LORazepam, ondansetron (ZOFRAN) IV, mouth rinse, oxyCODONE, polyethylene glycol, zolpidem  Antimicrobials: Anti-infectives (From admission, onward)    Start     Dose/Rate Route Frequency Ordered Stop   10/22/21 2100  azithromycin (ZITHROMAX) tablet 500 mg        500 mg Oral Every 24 hours 10/22/21 1219     10/20/21 1845  cefTRIAXone (ROCEPHIN) 1 g in sodium chloride 0.9 % 100 mL IVPB        1 g 200 mL/hr over 30 Minutes Intravenous Every 24 hours 10/20/21 1830  10/20/21 1845  azithromycin (ZITHROMAX) 500 mg in sodium chloride 0.9 % 250 mL IVPB  Status:  Discontinued        500 mg 250 mL/hr over 60 Minutes Intravenous Every 24 hours 10/20/21 1830 10/22/21 1218        I have personally reviewed the following labs and images: CBC: Recent Labs  Lab 10/20/21 1609 10/20/21 1738 10/21/21 0628 10/22/21 0221 10/23/21 1505  WBC 5.8  --  4.1 4.6 6.3  NEUTROABS 3.2  --   --   --   --   HGB 9.2* 9.9* 8.5* 8.0* 9.2*  HCT 28.4* 29.0* 24.9* 23.5* 28.4*  MCV 104.8*  --  104.6* 105.4* 103.3*  PLT 228  --  207 206 212   BMP &GFR Recent Labs  Lab 10/20/21 1609 10/20/21 1738 10/21/21 0628 10/22/21 0221 10/23/21 1505  NA 133* 132* 131* 134* 135  K 3.6 4.0 4.1 3.1* 4.0  CL 95*  --  95* 98 97*  CO2 27  --  23 27 30   GLUCOSE 105*  --  193* 118* 156*  BUN 22  --  25* 11 7*  CREATININE 6.57*  --  7.61* 3.88* 2.67*  CALCIUM 8.7*  --  9.2 8.5* 9.1  MG  --   --   --  1.5* 1.6*  PHOS  --   --  4.2 2.5 2.2*   Estimated Creatinine Clearance: 17.5 mL/min (A) (by C-G formula based on SCr of  2.67 mg/dL (H)). Liver & Pancreas: Recent Labs  Lab 10/20/21 1609 10/21/21 0628  AST 17  --   ALT 11  --   ALKPHOS 260*  --   BILITOT 0.7  --   PROT 7.0  --   ALBUMIN 3.3* 2.9*   No results for input(s): "LIPASE", "AMYLASE" in the last 168 hours. No results for input(s): "AMMONIA" in the last 168 hours. Diabetic: No results for input(s): "HGBA1C" in the last 72 hours. Recent Labs  Lab 10/23/21 1743 10/23/21 2012 10/24/21 0715 10/24/21 1140 10/24/21 1651  GLUCAP 150* 155* 95 147* 263*   Cardiac Enzymes: No results for input(s): "CKTOTAL", "CKMB", "CKMBINDEX", "TROPONINI" in the last 168 hours. No results for input(s): "PROBNP" in the last 8760 hours. Coagulation Profile: Recent Labs  Lab 10/20/21 1609  INR 1.2   Thyroid Function Tests: No results for input(s): "TSH", "T4TOTAL", "FREET4", "T3FREE", "THYROIDAB" in the last 72 hours. Lipid Profile: No results for input(s): "CHOL", "HDL", "LDLCALC", "TRIG", "CHOLHDL", "LDLDIRECT" in the last 72 hours. Anemia Panel: No results for input(s): "VITAMINB12", "FOLATE", "FERRITIN", "TIBC", "IRON", "RETICCTPCT" in the last 72 hours. Urine analysis:    Component Value Date/Time   COLORURINE YELLOW 09/12/2020 2333   APPEARANCEUR HAZY (A) 09/12/2020 2333   LABSPEC 1.012 09/12/2020 2333   PHURINE 8.0 09/12/2020 2333   GLUCOSEU NEGATIVE 09/12/2020 2333   HGBUR SMALL (A) 09/12/2020 2333   BILIRUBINUR NEGATIVE 09/12/2020 2333   KETONESUR NEGATIVE 09/12/2020 2333   PROTEINUR 100 (A) 09/12/2020 2333   UROBILINOGEN 0.2 09/05/2014 0013   NITRITE NEGATIVE 09/12/2020 2333   LEUKOCYTESUR NEGATIVE 09/12/2020 2333   Sepsis Labs: Invalid input(s): "PROCALCITONIN", "LACTICIDVEN"  Microbiology: Recent Results (from the past 240 hour(s))  Resp Panel by RT-PCR (Flu A&B, Covid) Anterior Nasal Swab     Status: None   Collection Time: 10/20/21  4:00 PM   Specimen: Anterior Nasal Swab  Result Value Ref Range Status   SARS Coronavirus 2 by  RT PCR NEGATIVE NEGATIVE Final  Comment: (NOTE) SARS-CoV-2 target nucleic acids are NOT DETECTED.  The SARS-CoV-2 RNA is generally detectable in upper respiratory specimens during the acute phase of infection. The lowest concentration of SARS-CoV-2 viral copies this assay can detect is 138 copies/mL. A negative result does not preclude SARS-Cov-2 infection and should not be used as the sole basis for treatment or other patient management decisions. A negative result may occur with  improper specimen collection/handling, submission of specimen other than nasopharyngeal swab, presence of viral mutation(s) within the areas targeted by this assay, and inadequate number of viral copies(<138 copies/mL). A negative result must be combined with clinical observations, patient history, and epidemiological information. The expected result is Negative.  Fact Sheet for Patients:  EntrepreneurPulse.com.au  Fact Sheet for Healthcare Providers:  IncredibleEmployment.be  This test is no t yet approved or cleared by the [place] FDA and  has been authorized for detection and/or diagnosis of SARS-CoV-2 by FDA under an Emergency Use Authorization (EUA). This EUA will remain  in effect (meaning this test can be used) for the duration of the COVID-19 declaration under Section 564(b)(1) of the Act, 21 U.S.C.section 360bbb-3(b)(1), unless the authorization is terminated  or revoked sooner.       Influenza A by PCR NEGATIVE NEGATIVE Final   Influenza B by PCR NEGATIVE NEGATIVE Final    Comment: (NOTE) The Xpert Xpress SARS-CoV-2/FLU/RSV plus assay is intended as an aid in the diagnosis of influenza from Nasopharyngeal swab specimens and should not be used as a sole basis for treatment. Nasal washings and aspirates are unacceptable for Xpert Xpress SARS-CoV-2/FLU/RSV testing.  Fact Sheet for Patients: EntrepreneurPulse.com.au  Fact Sheet  for Healthcare Providers: IncredibleEmployment.be  This test is not yet approved or cleared by the [place] FDA and has been authorized for detection and/or diagnosis of SARS-CoV-2 by FDA under an Emergency Use Authorization (EUA). This EUA will remain in effect (meaning this test can be used) for the duration of the COVID-19 declaration under Section 564(b)(1) of the Act, 21 U.S.C. section 360bbb-3(b)(1), unless the authorization is terminated or revoked.  Performed at [facility], [address] [place], [place] [address]   Respiratory (~20 pathogens) panel by PCR     Status: None   Collection Time: 10/20/21 10:09 PM   Specimen: Nasopharyngeal Swab; Respiratory  Result Value Ref Range Status   Adenovirus NOT DETECTED NOT DETECTED Final   Coronavirus 229E NOT DETECTED NOT DETECTED Final    Comment: (NOTE) The Coronavirus on the Respiratory Panel, DOES NOT test for the novel  Coronavirus (2019 nCoV)    Coronavirus HKU1 NOT DETECTED NOT DETECTED Final   Coronavirus NL63 NOT DETECTED NOT DETECTED Final   Coronavirus OC43 NOT DETECTED NOT DETECTED Final   Metapneumovirus NOT DETECTED NOT DETECTED Final   Rhinovirus / Enterovirus NOT DETECTED NOT DETECTED Final   Influenza A NOT DETECTED NOT DETECTED Final   Influenza B NOT DETECTED NOT DETECTED Final   Parainfluenza Virus 1 NOT DETECTED NOT DETECTED Final   Parainfluenza Virus 2 NOT DETECTED NOT DETECTED Final   Parainfluenza Virus 3 NOT DETECTED NOT DETECTED Final   Parainfluenza Virus 4 NOT DETECTED NOT DETECTED Final   Respiratory Syncytial Virus NOT DETECTED NOT DETECTED Final   Bordetella pertussis NOT DETECTED NOT DETECTED Final   Bordetella Parapertussis NOT DETECTED NOT DETECTED Final   Chlamydophila pneumoniae NOT DETECTED NOT DETECTED Final   Mycoplasma pneumoniae NOT DETECTED NOT DETECTED Final    Comment: Performed at [facility], [place] [address], [place], [place] [address]     Radiology Studies: No results found.    Ferry [address] [place]  If 7PM-7AM, please contact night-coverage [email or web address] 10/24/2021, 7:37 PM

## 2021-10-24 NOTE — Plan of Care (Signed)
  Problem: Clinical Measurements: Goal: Cardiovascular complication will be avoided 10/24/2021 0942 by Berneta Levins, RN Outcome: Progressing 10/24/2021 0745 by Berneta Levins, RN Outcome: Progressing   Problem: Activity: Goal: Risk for activity intolerance will decrease Outcome: Progressing   Problem: Nutrition: Goal: Adequate nutrition will be maintained Outcome: Progressing   Problem: Pain Managment: Goal: General experience of comfort will improve Outcome: Progressing   Problem: Metabolic: Goal: Ability to maintain appropriate glucose levels will improve Outcome: Progressing

## 2021-10-24 NOTE — Care Management Important Message (Signed)
Important Message  Patient Details  Name: Jodi Bell MRN: [id number] Date of Birth: [birth year]   Medicare Important Message Given:  Yes     [name] 10/24/2021, 3:15 PM

## 2021-10-25 ENCOUNTER — Other Ambulatory Visit (HOSPITAL_COMMUNITY): Payer: Self-pay

## 2021-10-25 DIAGNOSIS — I5033 Acute on chronic diastolic (congestive) heart failure: Secondary | ICD-10-CM | POA: Diagnosis not present

## 2021-10-25 DIAGNOSIS — J9621 Acute and chronic respiratory failure with hypoxia: Secondary | ICD-10-CM | POA: Diagnosis not present

## 2021-10-25 DIAGNOSIS — N186 End stage renal disease: Secondary | ICD-10-CM | POA: Diagnosis not present

## 2021-10-25 DIAGNOSIS — I25709 Atherosclerosis of coronary artery bypass graft(s), unspecified, with unspecified angina pectoris: Secondary | ICD-10-CM | POA: Diagnosis not present

## 2021-10-25 LAB — GLUCOSE, CAPILLARY: Glucose-Capillary: 134 mg/dL — ABNORMAL HIGH (ref 70–99)

## 2021-10-25 MED ORDER — PREDNISONE 20 MG PO TABS
40.0000 mg | ORAL_TABLET | Freq: Every day | ORAL | 0 refills | Status: DC
  Filled 2021-10-25: qty 6, 3d supply, fill #0

## 2021-10-25 MED ORDER — PREDNISONE 20 MG PO TABS: 40.0000 mg | ORAL_TABLET | Freq: Every day | ORAL | 0 refills | Status: AC

## 2021-10-25 NOTE — Progress Notes (Signed)
Pt has d/c order this am. Met with pt and pt's RN. Pt preparing for d/c and awaiting family to arrive. Contacted [facility] to advise staff that pt will d/c this am and will be at 11:25 appt. Spoke to RN at clinic. [facility] has access to [facility] epic and can print d/c summary once completed. RN aware that d/c summary has not been completed as of yet.   Melven Sartorius Renal Navigator [phone]

## 2021-10-25 NOTE — Plan of Care (Signed)
  Problem: Clinical Measurements: Goal: Respiratory complications will improve Outcome: Adequate for Discharge Goal: Cardiovascular complication will be avoided Outcome: Adequate for Discharge   Problem: Activity: Goal: Risk for activity intolerance will decrease Outcome: Adequate for Discharge   Problem: Nutrition: Goal: Adequate nutrition will be maintained Outcome: Adequate for Discharge   Problem: Coping: Goal: Level of anxiety will decrease Outcome: Adequate for Discharge   Problem: Elimination: Goal: Will not experience complications related to bowel motility Outcome: Adequate for Discharge Goal: Will not experience complications related to urinary retention Outcome: Adequate for Discharge   Problem: Pain Managment: Goal: General experience of comfort will improve Outcome: Adequate for Discharge   Problem: Safety: Goal: Ability to remain free from injury will improve Outcome: Adequate for Discharge   Problem: Skin Integrity: Goal: Risk for impaired skin integrity will decrease Outcome: Adequate for Discharge   Problem: Education: Goal: Ability to describe self-care measures that may prevent or decrease complications (Diabetes Survival Skills Education) will improve Outcome: Adequate for Discharge Goal: Individualized Educational Video(s) Outcome: Adequate for Discharge   Problem: Coping: Goal: Ability to adjust to condition or change in health will improve Outcome: Adequate for Discharge   Problem: Fluid Volume: Goal: Ability to maintain a balanced intake and output will improve Outcome: Adequate for Discharge   Problem: Health Behavior/Discharge Planning: Goal: Ability to identify and utilize available resources and services will improve Outcome: Adequate for Discharge Goal: Ability to manage health-related needs will improve Outcome: Adequate for Discharge   Problem: Metabolic: Goal: Ability to maintain appropriate glucose levels will improve Outcome:  Adequate for Discharge   Problem: Nutritional: Goal: Maintenance of adequate nutrition will improve Outcome: Adequate for Discharge Goal: Progress toward achieving an optimal weight will improve Outcome: Adequate for Discharge   Problem: Skin Integrity: Goal: Risk for impaired skin integrity will decrease Outcome: Adequate for Discharge   Problem: Tissue Perfusion: Goal: Adequacy of tissue perfusion will improve Outcome: Adequate for Discharge   Problem: Fluid Volume: Goal: Compliance with measures to maintain balanced fluid volume will improve Outcome: Adequate for Discharge   Problem: Health Behavior/Discharge Planning: Goal: Ability to manage health-related needs will improve Outcome: Adequate for Discharge   Problem: Nutritional: Goal: Ability to make healthy dietary choices will improve Outcome: Adequate for Discharge   Problem: Clinical Measurements: Goal: Complications related to the disease process, condition or treatment will be avoided or minimized Outcome: Adequate for Discharge

## 2021-10-25 NOTE — Progress Notes (Signed)
[facility] Progress Note   Subjective:   Seen in room. Dressed in street clothes. Says she's going home this am and will have HD at her outpatient center today. Feels better, but still has lingering cough.   Objective Vitals:   10/24/21 1522 10/24/21 2136 10/25/21 0500 10/25/21 0516  BP: (!) 160/75 (!) 182/74  133/72  Pulse: 70 77  68  Resp:  19  20  Temp:  98.3 F (36.8 C)  98.4 F (36.9 C)  TempSrc:  Oral  Oral  SpO2:  100%  95%  Weight:   61.2 kg   Height:       Physical Exam General: Well appearing woman, NAD. Nasal O2 in place Heart: RRR; 2/6 murmur Lungs: Diffuse wheezing in all lung fields again, no rales Abdomen: soft Extremities: No LE edema Dialysis Access: LUE AVF + bruit  Additional Objective Labs: Basic Metabolic Panel: Recent Labs  Lab 10/21/21 0628 10/22/21 0221 10/23/21 1505  NA 131* 134* 135  K 4.1 3.1* 4.0  CL 95* 98 97*  CO2 23 27 30   GLUCOSE 193* 118* 156*  BUN 25* 11 7*  CREATININE 7.61* 3.88* 2.67*  CALCIUM 9.2 8.5* 9.1  PHOS 4.2 2.5 2.2*    Liver Function Tests: Recent Labs  Lab 10/20/21 1609 10/21/21 0628  AST 17  --   ALT 11  --   ALKPHOS 260*  --   BILITOT 0.7  --   PROT 7.0  --   ALBUMIN 3.3* 2.9*    CBC: Recent Labs  Lab 10/20/21 1609 10/20/21 1738 10/21/21 0628 10/22/21 0221 10/23/21 1505  WBC 5.8  --  4.1 4.6 6.3  NEUTROABS 3.2  --   --   --   --   HGB 9.2*   < > 8.5* 8.0* 9.2*  HCT 28.4*   < > 24.9* 23.5* 28.4*  MCV 104.8*  --  104.6* 105.4* 103.3*  PLT 228  --  207 206 212   < > = values in this interval not displayed.    CBG: Recent Labs  Lab 10/24/21 0715 10/24/21 1140 10/24/21 1651 10/24/21 2033 10/25/21 0723  GLUCAP 95 147* 263* 203* 134*    Medications:  cefTRIAXone (ROCEPHIN)  IV Stopped (10/24/21 1749)    (feeding supplement) PROSource Plus  30 mL Oral BID BM   aspirin EC  81 mg Oral Daily   azithromycin  500 mg Oral Q24H   cloNIDine  0.3 mg Oral TID   darbepoetin  (ARANESP) injection - DIALYSIS  200 mcg Intravenous Q Mon-HD   Gerhardt's butt cream   Topical BID   heparin  5,000 Units Subcutaneous Q8H   hydrALAZINE  100 mg Oral TID   insulin aspart  0-6 Units Subcutaneous TID WC   irbesartan  300 mg Oral Daily   isosorbide mononitrate  60 mg Oral Daily   labetalol  400 mg Oral TID   lidocaine  1 patch Transdermal Q24H   methylPREDNISolone (SOLU-MEDROL) injection  40 mg Intravenous Q12H   montelukast  10 mg Oral Q supper   NIFEdipine  60 mg Oral BID   pantoprazole  40 mg Oral Daily   sodium chloride flush  3 mL Intravenous Q12H   umeclidinium bromide  1 puff Inhalation Daily    Dialysis Orders: [facility] on MWF 3:30hr, 400/600, EDW 62kg, 2K/2.5Ca, LUE AVF, no heparin - Epogen 20,000U TIW - Zemplar 8.40mcg IV q HD - Venofer 50mg  IV q week - HBsAb + 03/06/21  Assessment/Plan:  Dyspnea/cough: RLL pneumonia + COPD exacerbation + fluid overload component all contributing. Extra fluid pulled with HD, now well below her prior EDW. Remains on Ceftriaxone + Azithromycin. COVID/Flu/Viral panel negative  ESRD:  Usual MWF schedule - HD today at outpatient center   Hypertension/volume: BP improved, home meds resumed and continue to challenge down her dry weight.  Anemia of ESRD: Hgb 9.2, on high dose ESA as outpatient, started Aranesp 261mcg weekly here.  Metabolic bone disease: CorrCa high on admit - holding VDRA for now. Phos low - no binders.  Nutrition:  Alb low, continue protein supplements.  CAD/Hx CABG 2022/HFpEF  COPD/prior tobacco use: On baseline O2 2L/min + daily low-dose prednisone at home.  Lynnda Child PA-C [facility] 10/25/2021,8:58 AM

## 2021-10-25 NOTE — TOC Transition Note (Signed)
Transition of Care [facility]) - CM/SW Discharge Note   Patient Details  Name: Jodi Bell MRN: [id number] Date of Birth: Jul 04, 1960  Transition of Care [facility] CM/SW Contact:  Tom-Johnson, Renea Ee, RN Phone Number: 10/25/2021, 10:21 AM   Clinical Narrative:     Patient is scheduled for discharge today. Outpatient PT info on AVS. Family to transport at discharge. No further TOC needs noted.    Final next level of care: OP Rehab Barriers to Discharge: Barriers Resolved   Patient Goals and CMS Choice Patient states their goals for this hospitalization and ongoing recovery are:: Toreturn home CMS Medicare.gov Compare Post Acute Care list provided to:: Patient Choice offered to / list presented to : NA  Discharge Placement                Patient to be transferred to facility by: Family      Discharge Plan and Services   Discharge Planning Services: CM Consult Post Acute Care Choice: NA          DME Arranged: N/A DME Agency: NA       HH Arranged: NA HH Agency: NA        Social Determinants of Health (SDOH) Interventions     Readmission Risk Interventions    07/23/2021    3:40 PM 06/28/2021    5:10 PM  Readmission Risk Prevention Plan  Transportation Screening Complete Complete  Medication Review Press photographer) Complete Complete  PCP or Specialist appointment within 3-5 days of discharge Complete   HRI or [place] Complete Complete  SW Recovery Care/Counseling Consult Complete   Palliative Care Screening Not Applicable Not [place] Not Applicable Not Applicable

## 2021-10-25 NOTE — Discharge Summary (Signed)
Physician Discharge Summary  Jodi Bell PPI:[id number] DOB: 1960-06-12 DOA: 10/20/2021  PCP: [name]., MD  Admit date: 10/20/2021 Discharge date: 10/25/2021 Admitted From: Home Disposition: Home Recommendations for Outpatient Follow-up:  Follow up with PCP in 1 to 2 weeks Please follow up on the following pending results: None  Home Health: Therapy recommended outpatient follow-up.  Patient already has referral for outpatient PT. Equipment/Devices: Home 2 L by nasal cannula.  Discharge Condition: Stable CODE STATUS: Full code  Follow-up Information     [name]., MD. Schedule an appointment as soon as possible for a visit.   Specialty: Internal Medicine Contact information: [address] [place] [address] [phone]         [place]. Go on 10/25/2021.   Why: Please arrive at 11:10/11:15 for 11:25 chair time.                Hospital course 61 year old F with PMH of CAD/CABG, DM-2, HTN, ESRD on HD MWF, OSA on CPAP, asthma, GERD and anemia presenting with progressive shortness of breath for 2 weeks that has acutely gotten worse for 2 days prior to presentation.  She was admitted for acute on chronic respiratory failure with hypoxia in the setting of CHF exacerbation, CAP and COPD exacerbation.  Reportedly missed 2 HD sessions prior to her last HD.  BNP was elevated to 4500.  CTA chest negative for PE but showed patchy consolidation of RLL concerning for pneumonia.  Nephrology consulted.  Some improvement in respiratory status with dialysis and antibiotics.  However, continued to have significant cough and rhonchi.  Started on systemic steroid with IV Solu-Medrol.  On the day of discharge, patient's respiratory symptoms improved.  She is discharged on p.o. prednisone 40 mg daily for 3 more days in addition to her chronic 5 mg daily trend him.  We have changed his Spiriva to [employer].  She will continue dialysis outpatient.  See  individual problem list below for more.   Problems addressed during this hospitalization Principal Problem:   Acute on chronic respiratory failure with hypoxia (HCC) Active Problems:   Diabetes mellitus with coincident hypertension (HCC)   Essential hypertension   GERD (gastroesophageal reflux disease)   Anemia   Asthma   ESRD (end stage renal disease) (HCC)   OSA (obstructive sleep apnea)   S/P CABG x 2   Acute on chronic diastolic CHF (congestive heart failure) ([place])   Coronary artery disease involving coronary bypass graft of native heart with angina pectoris (HCC)   Paroxysmal atrial fibrillation (HCC)   Volume overload   Acute on chronic hypoxic respiratory failure: Seems to be on 2 L at baseline.  Multifactorial including pneumonia, diastolic CHF and COPD exacerbation.  Missed 2 HD lesions prior to her last outpatient HD.  CTA chest negative for PE but RLL infiltrate.  COVID-19 and influenza PCR nonreactive.  TTE with LVEF of 65 to 70% and no RWMA.  Improved.  Stable on home 2 L. -Completed 5 days of antibiotics for CAP -Received IV Solu-Medrol on 9/14 and 9/15.  Discharged on p.o. prednisone for 3 more days -Changed Spiriva to [employer].:  Continue rescue inhalers. -Antitussive -dialysis outpatient.   Volume overload/acute on chronic diastolic CHF/ESRD on HD MWF: TTE as above.  BNP was elevated to 4500.  Improved. -Fluid management by dialysis   Community-acquired pneumonia: CTA chest negative for PE but concerning for RLL infiltrate. -Completed antibiotic course with IV ceftriaxone and Zithromax for 5  days.   Essential hypertension: BP elevated partly due to volume. -Continue clonidine, olmesartan, hydralazine, Imdur, nifedipine and labetalol.   GERD: -Continue pantoprazole 40 mg daily   CAD s/p CABG: Able. -Continue home olmesartan, labetalol and aspirin   Macrocytic anemia: Relatively stable.  OSA on CPAP: Noncompliant with CPAP. -Continue CPAP.    Controlled  NIDDM-2 with hyperglycemia: A1c 5.2% on 8/4.  Does not seem to be on medication at home.   Generalized weakness/physical deconditioning.  She says she lives with daughter.  Uses rolling walker at baseline.  Therapy recommended outpatient PT. per [facility], patient has referral in place           Vital signs Vitals:   10/24/21 1522 10/24/21 2136 10/25/21 0500 10/25/21 0516  BP: (!) 160/75 (!) 182/74  133/72  Pulse: 70 77  68  Temp:  98.3 F (36.8 C)  98.4 F (36.9 C)  Resp:  19  20  Height:      Weight:   61.2 kg   SpO2:  100%  95%  TempSrc:  Oral  Oral  BMI (Calculated):   24.67      Discharge exam  GENERAL: No apparent distress.  Nontoxic. HEENT: MMM.  Vision and hearing grossly intact.  NECK: Supple.  No apparent JVD.  RESP:  No IWOB.  Fair aeration bilaterally.  Some rhonchi. CVS:  RRR. Heart sounds normal.  ABD/GI/GU: BS+. Abd soft, NTND.  MSK/EXT:  Moves extremities. No apparent deformity. No edema.  SKIN: no apparent skin lesion or wound NEURO: Awake and alert. Oriented appropriately.  No apparent focal neuro deficit. PSYCH: Calm. Normal affect.   Discharge Instructions Discharge Instructions     Ambulatory referral to Physical Therapy   Complete by: As directed    Diet - low sodium heart healthy   Complete by: As directed    Discharge instructions   Complete by: As directed    It has been a pleasure taking care of you!  You were hospitalized due to difficulty breathing likely from pneumonia (lung infection), fluid and asthma/COPD exacerbation.  You have completed antibiotic course for pneumonia.  We are discharging you on increased dose of prednisone for asthma/COPD exacerbation.  We have also added new inhaler for COPD/asthma.  Continue dialysis for fluid.  Follow-up with your primary care doctor in 1 to 2 weeks or sooner if needed.  Take your medications as prescribed.   Take care,   Increase activity slowly   Complete by: As directed       Allergies as of  10/25/2021       Reactions   3-methyl-2-benzothiazolinone Hydrazone Other (See Comments)   Muscle weakness muscle cramping in legs Other reaction(s): Myalgias (Muscle Pain)   Banana Anaphylaxis, Swelling, Other (See Comments)   TONGUE AND MOUTH SWELLING   Black Walnut Flavor Anaphylaxis, Itching   Walnuts   Hazelnut (filbert) Allergy Skin Test Anaphylaxis   Hazelnuts   Leflunomide And Related Other (See Comments)   Severe Headache   Lisinopril Swelling   Angioedema  Swelling of the face   No [employer]alt[employer] Food Allergies Anaphylaxis   Spicy Mustard 4.7.2021 Pt reports that she eats Regular Yellow Mustard Swelling and itching of tongue   Other Other (See Comments), Anaphylaxis, Swelling   Cosentyx= angioedema Other reaction(s): Facial Edema (intolerance) Mouth itches and tongue swelling Hazel nuts and pecans also Walnuts Walnuts Renal failure Other reaction(s): Other Serotonin Syndrome  Serotonin syndrome  Tremors Other reaction(s): Other Tremors Muscle weakness muscle cramping in legs  Other reaction(s): Myalgias (Muscle Pain) Mouth itches and tongue swelling Hazel nuts and pecans also Walnuts   Pecan Extract Allergy Skin Test Anaphylaxis, Itching   Pecans   Pecan Nut (diagnostic) Anaphylaxis, Itching   Pecans   Trazodone And Nefazodone Other (See Comments)   Serotonin Syndrome  Tremors   Adalimumab Other (See Comments)   Blisters on abdomen =humira   Escitalopram Oxalate Other (See Comments)   Hand problems - Serotonin Syndrome    Ezetimibe Other (See Comments)   myalgias cramps   Secukinumab Swelling   Cosentyx = angiodema   Statins Other (See Comments)   Leg pains and weakness   Ferrlecit [na Ferric Gluc Cplx In Sucrose]    Not reaction given from HD   Gabapentin Other (See Comments)   tremors   Ibuprofen Other (See Comments)   Renal Problems   Duloxetine Rash        Medication List     STOP taking these medications    Spiriva Respimat 2.5  MCG/ACT Aers Generic drug: Tiotropium Bromide Monohydrate       TAKE these medications    acetaminophen 500 MG tablet Commonly known as: TYLENOL Take 1,000 mg by mouth daily as needed (pain).   albuterol (2.5 MG/3ML) 0.083% nebulizer solution Commonly known as: PROVENTIL Take 3 mLs (2.5 mg total) by nebulization every 6 (six) hours as needed for wheezing or shortness of breath.   Ventolin HFA 108 (90 Base) MCG/ACT inhaler Generic drug: albuterol Inhale 2 puffs into the lungs See admin instructions. Take 2 puffs every 4-6 hours as needed for wheezing and shortness of breath   allopurinol 100 MG tablet Commonly known as: ZYLOPRIM Take 100 mg by mouth every evening.   aspirin EC 81 MG tablet Take 1 tablet (81 mg total) by mouth daily. Swallow whole.   benzonatate 100 MG capsule Commonly known as: Tessalon Perles Take 1 capsule (100 mg total) by mouth every 6 (six) hours as needed for up to 10 days for cough.   calcium acetate 667 MG capsule Commonly known as: PHOSLO Take 1,334 mg by mouth 3 (three) times daily with meals.   carboxymethylcellulose 1 % ophthalmic solution Place 2 drops into both eyes 2 (two) times daily as needed (dry eyes).   cetirizine 10 MG tablet Commonly known as: ZYRTEC Take 10 mg by mouth daily as needed for allergies.   cinacalcet 60 MG tablet Commonly known as: SENSIPAR Take 60 mg by mouth daily.   cloNIDine 0.3 MG tablet Commonly known as: CATAPRES Take 0.3 mg by mouth 3 (three) times daily.   cyclobenzaprine 10 MG tablet Commonly known as: FLEXERIL Take 10 mg by mouth 2 (two) times daily as needed for muscle spasms.   Darbepoetin Alfa 200 MCG/0.4ML Sosy injection Commonly known as: ARANESP Inject 0.4 mLs (200 mcg total) into the vein every Wednesday with hemodialysis.   diclofenac Sodium 1 % Gel Commonly known as: VOLTAREN Apply 2 g topically daily as needed (pain).   docusate sodium 100 MG capsule Commonly known as: COLACE Take  100-200 mg by mouth daily as needed for mild constipation.   ferrous sulfate 325 (65 FE) MG tablet Take 325 mg by mouth See admin instructions. Take 325 mg by mouth three times a week Tues, Thur, Sat   folic acid 469 MCG tablet Commonly known as: FOLVITE Take 400 mcg by mouth daily.   gabapentin 300 MG capsule Commonly known as: Neurontin Take 1 capsule (300 mg total) by mouth at bedtime as  needed (Neuropathic pain). What changed: when to take this   heparin 1000 unit/mL Soln injection 3 mLs (3,000 Units total) by Dialysis route as needed (in dialysis as needed).   heparin 1000 unit/mL Soln injection 2 mLs (2,000 Units total) by Dialysis route as needed (in dialysis as needed).   heparin 1000 unit/mL Soln injection 1 mL (1,000 Units total) by Dialysis route as needed (in dialysis).   hydrALAZINE 100 MG tablet Commonly known as: APRESOLINE Take 1 tablet (100 mg total) by mouth every 8 (eight) hours. What changed: when to take this   isosorbide mononitrate 60 MG 24 hr tablet Commonly known as: IMDUR Take 1 tablet (60 mg total) by mouth daily.   labetalol 200 MG tablet Commonly known as: NORMODYNE Take 2 tablets (400 mg total) by mouth 3 (three) times daily.   lidocaine 5 % Commonly known as: LIDODERM Place 1 patch onto the skin every 12 (twelve) hours as needed. Remove & Discard patch within 12 hours or as directed by MD What changed:  when to take this reasons to take this   LORazepam 1 MG tablet Commonly known as: ATIVAN Take 1 mg by mouth every 8 (eight) hours as needed for anxiety.   montelukast 10 MG tablet Commonly known as: SINGULAIR Take 10 mg by mouth daily with supper.   NIFEdipine 60 MG 24 hr tablet Commonly known as: PROCARDIA XL/NIFEDICAL XL Take 60 mg by mouth 2 (two) times daily.   olmesartan 40 MG tablet Commonly known as: BENICAR Take 40 mg by mouth every evening.   ondansetron 4 MG tablet Commonly known as: ZOFRAN Take 4 mg by mouth 2 (two)  times daily as needed for nausea or vomiting.   OVER THE COUNTER MEDICATION Take by mouth daily as needed (sickness). Elderberry Fruit and flower 1 dropper full by mouth daily as needed for sickness   Oxycodone HCl 10 MG Tabs Take 10 mg by mouth daily as needed for pain.   OXYGEN Inhale 2 L into the lungs as directed.   pantoprazole 40 MG tablet Commonly known as: PROTONIX Take 40 mg by mouth daily.   predniSONE 5 MG tablet Commonly known as: DELTASONE Take 4 tablets (20 mg total) by mouth daily with breakfast for 2 days, THEN 1 tablet (5 mg total) daily with breakfast. Start taking on: Jun 29, 2021 What changed: See the new instructions.   predniSONE 20 MG tablet Commonly known as: DELTASONE Take 2 tablets (40 mg total) by mouth daily for 3 days. Start taking on: October 26, 2021 What changed: You were already taking a medication with the same name, and this prescription was added. Make sure you understand how and when to take each.   PROBIOTIC PO Take 1 capsule by mouth daily.   Proctozone-HC 2.5 % rectal cream Generic drug: hydrocortisone Place rectally 2 (two) times daily. What changed:  how much to take when to take this reasons to take this   sorbitol 70 % Soln Take 30 ml 1-2 times daily as needed for constipation   Stiolto Respimat 2.5-2.5 MCG/ACT Aers Generic drug: Tiotropium Bromide-Olodaterol Inhale 2 puffs once daily into the lungs.   cholecalciferol 10 MCG (400 UNIT) Tabs tablet Commonly known as: VITAMIN D3 Take 800 Units by mouth daily.   vitamin D3 25 MCG tablet Commonly known as: CHOLECALCIFEROL Take 1 tablet (1,000 Units total) by mouth every evening.   zolpidem 5 MG tablet Commonly known as: AMBIEN Take 5 mg by mouth at bedtime as needed  for sleep.        Consultations: Nephrology  Procedures/Studies:   CT Angio Chest PE W/Cm &/Or Wo Cm  Result Date: 10/20/2021 CLINICAL DATA:  Pulmonary embolism suspected. Unknown D-dimer.  Productive cough, intermittent diarrhea and nausea for 2 weeks. EXAM: CT ANGIOGRAPHY CHEST WITH CONTRAST TECHNIQUE: Multidetector CT imaging of the chest was performed using the standard protocol during bolus administration of intravenous contrast. Multiplanar CT image reconstructions and MIPs were obtained to evaluate the vascular anatomy. RADIATION DOSE REDUCTION: This exam was performed according to the departmental dose-optimization program which includes automated exposure control, adjustment of the mA and/or kV according to patient size and/or use of iterative reconstruction technique. CONTRAST:  70mL OMNIPAQUE IOHEXOL 350 MG/ML SOLN COMPARISON:  Chest CT dated 08/27/2021. chest CT dated 09/23/2020. FINDINGS: Cardiovascular: Some of the most peripheral segmental and subsegmental pulmonary artery branches can not be definitively characterized due to considerable patient breathing motion artifact, however, there is no pulmonary embolism identified within the main, lobar or central segmental pulmonary arteries bilaterally. Cardiomegaly. No substantial pericardial effusion. No thoracic aortic aneurysm or evidence of aortic dissection. Three-vessel coronary artery calcifications. Surgical changes of median sternotomy and CABG. Mediastinum/Nodes: Conglomerate lymphadenopathy within the subcarinal space of the mediastinum, significantly increased compared to chest CT of 09/23/2020 and similar in appearance compared to more recent chest CT of 01/02/2021. Numerous additional small and moderately enlarged lymph nodes throughout the remainder of the mediastinum. Esophagus is unremarkable, where seen. Trachea and central bronchi are unremarkable. Lungs/Pleura: Patchy consolidations within the RIGHT lower lobe. Mild atelectasis at the LEFT lung base. Small RIGHT pleural effusion. No pneumothorax. Upper Abdomen: No acute findings. Musculoskeletal: No acute-appearing osseous abnormality. Review of the MIP images confirms the  above findings. IMPRESSION: 1. Patchy consolidations within the RIGHT lower lobe, likely pneumonia. 2. Conglomerate lymphadenopathy within the subcarinal space of the mediastinum, significantly increased compared to chest CT of 09/23/2020 and similar in appearance compared to more recent chest CT of 01/02/2021, differential considerations including benign reactive lymphadenopathy, metastatic lymphadenopathy and lymphoma. 3. No pulmonary embolism seen, with mild study limitations detailed above. 4. Marked cardiomegaly. Three-vessel coronary artery calcifications. Surgical changes of median sternotomy and CABG. 5. Small RIGHT pleural effusion. Electronically Signed   By: Franki Cabot M.D.   On: 10/20/2021 18:12   DG Chest 2 View  Result Date: 10/20/2021 CLINICAL DATA:  Cough 2 weeks with shortness of breath.  Hypoxia. EXAM: CHEST - 2 VIEW COMPARISON:  10/02/2021 FINDINGS: Lungs are adequately inflated with persistent hazy perihilar opacification compatible with mild vascular congestion/edema. No significant effusion. Moderate stable cardiomegaly. Remainder of the exam is unchanged. IMPRESSION: Moderate stable cardiomegaly with findings suggesting mild vascular congestion/edema. Electronically Signed   By: Marin Olp M.D.   On: 10/20/2021 16:31   DG Chest Port 1 View  Result Date: 10/02/2021 CLINICAL DATA:  Chest pain and tightness since last night worsened this morning, shortness of breath EXAM: PORTABLE CHEST 1 VIEW COMPARISON:  Portable exam 1042 hours compared to 07/16/2021 FINDINGS: Enlargement of cardiac silhouette post CABG. Slight pulmonary vascular congestion. Atherosclerotic calcification aorta. Lungs clear. No definite acute pulmonary infiltrate/edema, pleural effusion, or pneumothorax. Bones demineralized. IMPRESSION: Enlargement of cardiac silhouette with pulmonary vascular congestion post CABG. No acute abnormalities. Aortic Atherosclerosis (ICD10-I70.0). Electronically Signed   By: Lavonia Dana M.D.   On: 10/02/2021 10:47       The results of significant diagnostics from this hospitalization (including imaging, microbiology, ancillary and laboratory) are listed below for reference.  Microbiology: Recent Results (from the past 240 hour(s))  Resp Panel by RT-PCR (Flu A&B, Covid) Anterior Nasal Swab     Status: None   Collection Time: 10/20/21  4:00 PM   Specimen: Anterior Nasal Swab  Result Value Ref Range Status   SARS Coronavirus 2 by RT PCR NEGATIVE NEGATIVE Final    Comment: (NOTE) SARS-CoV-2 target nucleic acids are NOT DETECTED.  The SARS-CoV-2 RNA is generally detectable in upper respiratory specimens during the acute phase of infection. The lowest concentration of SARS-CoV-2 viral copies this assay can detect is 138 copies/mL. A negative result does not preclude SARS-Cov-2 infection and should not be used as the sole basis for treatment or other patient management decisions. A negative result may occur with  improper specimen collection/handling, submission of specimen other than nasopharyngeal swab, presence of viral mutation(s) within the areas targeted by this assay, and inadequate number of viral copies(<138 copies/mL). A negative result must be combined with clinical observations, patient history, and epidemiological information. The expected result is Negative.  Fact Sheet for Patients:  EntrepreneurPulse.com.au  Fact Sheet for Healthcare Providers:  IncredibleEmployment.be  This test is no t yet approved or cleared by the [place] FDA and  has been authorized for detection and/or diagnosis of SARS-CoV-2 by FDA under an Emergency Use Authorization (EUA). This EUA will remain  in effect (meaning this test can be used) for the duration of the COVID-19 declaration under Section 564(b)(1) of the Act, 21 U.S.C.section 360bbb-3(b)(1), unless the authorization is terminated  or revoked sooner.        Influenza A by PCR NEGATIVE NEGATIVE Final   Influenza B by PCR NEGATIVE NEGATIVE Final    Comment: (NOTE) The Xpert Xpress SARS-CoV-2/FLU/RSV plus assay is intended as an aid in the diagnosis of influenza from Nasopharyngeal swab specimens and should not be used as a sole basis for treatment. Nasal washings and aspirates are unacceptable for Xpert Xpress SARS-CoV-2/FLU/RSV testing.  Fact Sheet for Patients: EntrepreneurPulse.com.au  Fact Sheet for Healthcare Providers: IncredibleEmployment.be  This test is not yet approved or cleared by the [place] FDA and has been authorized for detection and/or diagnosis of SARS-CoV-2 by FDA under an Emergency Use Authorization (EUA). This EUA will remain in effect (meaning this test can be used) for the duration of the COVID-19 declaration under Section 564(b)(1) of the Act, 21 U.S.C. section 360bbb-3(b)(1), unless the authorization is terminated or revoked.  Performed at [facility], [address] [place], [place] [address]   Respiratory (~20 pathogens) panel by PCR     Status: None   Collection Time: 10/20/21 10:09 PM   Specimen: Nasopharyngeal Swab; Respiratory  Result Value Ref Range Status   Adenovirus NOT DETECTED NOT DETECTED Final   Coronavirus 229E NOT DETECTED NOT DETECTED Final    Comment: (NOTE) The Coronavirus on the Respiratory Panel, DOES NOT test for the novel  Coronavirus (2019 nCoV)    Coronavirus HKU1 NOT DETECTED NOT DETECTED Final   Coronavirus NL63 NOT DETECTED NOT DETECTED Final   Coronavirus OC43 NOT DETECTED NOT DETECTED Final   Metapneumovirus NOT DETECTED NOT DETECTED Final   Rhinovirus / Enterovirus NOT DETECTED NOT DETECTED Final   Influenza A NOT DETECTED NOT DETECTED Final   Influenza B NOT DETECTED NOT DETECTED Final   Parainfluenza Virus 1 NOT DETECTED NOT DETECTED Final   Parainfluenza Virus 2 NOT DETECTED NOT DETECTED Final   Parainfluenza Virus  3 NOT DETECTED NOT DETECTED Final   Parainfluenza Virus  4 NOT DETECTED NOT DETECTED Final   Respiratory Syncytial Virus NOT DETECTED NOT DETECTED Final   Bordetella pertussis NOT DETECTED NOT DETECTED Final   Bordetella Parapertussis NOT DETECTED NOT DETECTED Final   Chlamydophila pneumoniae NOT DETECTED NOT DETECTED Final   Mycoplasma pneumoniae NOT DETECTED NOT DETECTED Final    Comment: Performed at [facility], [place] [address] [place], [place] [address]     Labs:  CBC: Recent Labs  Lab 10/20/21 1609 10/20/21 1738 10/21/21 0628 10/22/21 0221 10/23/21 1505  WBC 5.8  --  4.1 4.6 6.3  NEUTROABS 3.2  --   --   --   --   HGB 9.2* 9.9* 8.5* 8.0* 9.2*  HCT 28.4* 29.0* 24.9* 23.5* 28.4*  MCV 104.8*  --  104.6* 105.4* 103.3*  PLT 228  --  207 206 212   BMP &GFR Recent Labs  Lab 10/20/21 1609 10/20/21 1738 10/21/21 0628 10/22/21 0221 10/23/21 1505  NA 133* 132* 131* 134* 135  K 3.6 4.0 4.1 3.1* 4.0  CL 95*  --  95* 98 97*  CO2 27  --  23 27 30   GLUCOSE 105*  --  193* 118* 156*  BUN 22  --  25* 11 7*  CREATININE 6.57*  --  7.61* 3.88* 2.67*  CALCIUM 8.7*  --  9.2 8.5* 9.1  MG  --   --   --  1.5* 1.6*  PHOS  --   --  4.2 2.5 2.2*   Estimated Creatinine Clearance: 19 mL/min (A) (by C-G formula based on SCr of 2.67 mg/dL (H)). Liver & Pancreas: Recent Labs  Lab 10/20/21 1609 10/21/21 0628  AST 17  --   ALT 11  --   ALKPHOS 260*  --   BILITOT 0.7  --   PROT 7.0  --   ALBUMIN 3.3* 2.9*   No results for input(s): "LIPASE", "AMYLASE" in the last 168 hours. No results for input(s): "AMMONIA" in the last 168 hours. Diabetic: No results for input(s): "HGBA1C" in the last 72 hours. Recent Labs  Lab 10/24/21 0715 10/24/21 1140 10/24/21 1651 10/24/21 2033 10/25/21 0723  GLUCAP 95 147* 263* 203* 134*   Cardiac Enzymes: No results for input(s): "CKTOTAL", "CKMB", "CKMBINDEX", "TROPONINI" in the last 168 hours. No results for input(s): "PROBNP" in the last  8760 hours. Coagulation Profile: Recent Labs  Lab 10/20/21 1609  INR 1.2   Thyroid Function Tests: No results for input(s): "TSH", "T4TOTAL", "FREET4", "T3FREE", "THYROIDAB" in the last 72 hours. Lipid Profile: No results for input(s): "CHOL", "HDL", "LDLCALC", "TRIG", "CHOLHDL", "LDLDIRECT" in the last 72 hours. Anemia Panel: No results for input(s): "VITAMINB12", "FOLATE", "FERRITIN", "TIBC", "IRON", "RETICCTPCT" in the last 72 hours. Urine analysis:    Component Value Date/Time   COLORURINE YELLOW 09/12/2020 2333   APPEARANCEUR HAZY (A) 09/12/2020 2333   LABSPEC 1.012 09/12/2020 2333   PHURINE 8.0 09/12/2020 2333   GLUCOSEU NEGATIVE 09/12/2020 2333   HGBUR SMALL (A) 09/12/2020 2333   BILIRUBINUR NEGATIVE 09/12/2020 2333   KETONESUR NEGATIVE 09/12/2020 2333   PROTEINUR 100 (A) 09/12/2020 2333   UROBILINOGEN 0.2 09/05/2014 0013   NITRITE NEGATIVE 09/12/2020 2333   LEUKOCYTESUR NEGATIVE 09/12/2020 2333   Sepsis Labs: Invalid input(s): "PROCALCITONIN", "LACTICIDVEN"   SIGNED:  Mercy Riding, MD  [facility] 10/25/2021, 2:41 PM

## 2021-10-28 LAB — HEPATITIS B CORE ANTIBODY, TOTAL

## 2021-11-21 ENCOUNTER — Encounter: Payer: Self-pay | Admitting: Pulmonary Disease

## 2021-11-21 ENCOUNTER — Ambulatory Visit (INDEPENDENT_AMBULATORY_CARE_PROVIDER_SITE_OTHER): Payer: Medicare PPO | Admitting: Pulmonary Disease

## 2021-11-21 VITALS — BP 138/68 | HR 69 | Temp 98.2°F | Wt 137.2 lb

## 2021-11-21 DIAGNOSIS — R590 Localized enlarged lymph nodes: Secondary | ICD-10-CM | POA: Diagnosis not present

## 2021-11-21 DIAGNOSIS — I272 Pulmonary hypertension, unspecified: Secondary | ICD-10-CM | POA: Diagnosis not present

## 2021-11-21 DIAGNOSIS — J9611 Chronic respiratory failure with hypoxia: Secondary | ICD-10-CM

## 2021-11-21 DIAGNOSIS — R0609 Other forms of dyspnea: Secondary | ICD-10-CM | POA: Diagnosis not present

## 2021-11-21 NOTE — Patient Instructions (Signed)
Nice to see you again  No changes to medications  I sent a referral to Pulmonary rehab  The lymph nodes are stable compared to 12/2020. This is reassuring. We can repeat a scan to keep an ey on them in a few months  Return to clinic 3 months or sooner as needed

## 2021-11-21 NOTE — Progress Notes (Signed)
@Patient  ID: Jodi Bell, female    DOB: Sep 30, 1960, 61 y.o.   MRN: [id number]  Chief Complaint  Patient presents with   Follow-up    Follow up for acute resp failure. Pt states she is just getting over PNA and she is slowly gaining her strength back. Pt states that she is not seeing a different with stiolto inhaler. Pt is having to wear oxygen even more now since PNA.     Referring provider: [name]., MD  HPI:   61 y.o. woman whom we are seeing in hospital follow-up and for follow up for evaluation of dyspnea on exertion, chronic hypoxemic respiratory failure.  Discharge summary 10/2021 reviewed.  Hospitalized 10/2021.  With shortness of breath and worsening hypoxemia.  This is in the setting of missing 2 HD sessions.  She missed HD sessions because of preceding viral illness the week prior.  Her BNP was markedly elevated.  CT 10/20/2021 on my review interpretation reveals bilateral effusions, right lower lobe atelectasis versus infiltrate, most likely atelectasis with signs of volume overload.  Respiratory viral panel was negative for viral and some bacterial pathogens.  She was treated with increased dialysis as well as steroids and antibiotics.  I will see any evidence of fever.  She was markedly hypertensive on admission.  Since discharge she is using her oxygen more.  3 L with exertion.  Office visit was not using really at all with exertion.  Although previously she had required up to 2 to 3 L with exertion.   She feels more dyspneic.  Reviewed at length her prior pulmonary testing including PFTs.  This prompted CT scan high-resolution that did not demonstrate any significant pulmonary abnormality.  We have attempted inhaler therapy including short acting bronchodilators and dual long-acting bronchodilators via Stiolto.  She has not found them beneficial.  She does not want to use inhaled corticosteroids.  Discussed this likely is some other processes contributing to her symptoms  given reassuring testing.  They are concerned about lymphadenopathy, subcarinal.  This is slightly enlarged and stable 12/2020 compared to more recent scan 10/2021.  Increased from 09/2020.  Discussed at length I think this is reactive due to recent illness, chronic volume overload.  They are very concerned about ongoing follow-up of the lymph nodes.  HPI at initial visit: She has chronic dyspnea.  Using oxygen for couple months now.  Checks her oxygen saturation usually stays okay while at rest, occasionally drops while at rest.  Usually drops when moving.  Some days are better than others.  Despite using 2 L occasionally drops into the 80s with exertion per her report.  She was walk today 3 laps around the office and did not desaturate below 95%.  Reviewed at length her prior spirometry and chest imaging.  Spirometry does not show COPD.  Chest imaging repeatedly shows evidence of volume overload and pulmonary edema.  Discussed this is most likely the cause for her hypoxemia.  Suspect it waxes and wanes based on her volume status as it relates to the timing of her dialysis.  She was counseled the same.  Reviewed most recent CT chest 12-2020 that shows evidence of volume overload with interlobular septal thickening and small bilateral pleural effusions without evidence of ILD or other parenchymal abnormality.  Most recent chest x-ray 02/2021 reviewed which shows peribronchovascular opacities most consistent with pulmonary edema on my interpretation.  Reviewed TTE most recent 12/2020 that shows severe left atrial dilation, grade 2 diastolic dysfunction, elevated  left atrial pressure, aortic stenosis, mitral valve regurgitation and mitral valve stenosis as well as enlarged RV with reduced function.  Counseled most likely reason for her dyspnea is volume overloaded as a relates to her chronic heart failure worsened as she cannot excrete her own urine with relied on dialysis for volume removal.  PMH: ESRD on HD,  chronic diastolic heart failure, mitral and aortic valve abnormalities, hypertension Surgical history: Knee replacement, CABG Family history: Mother with history of lung cancer Social history: Former smoker, 20-pack-year history, quit 2022, lives in [place] / Pulmonary Flowsheets:   ACT:      No data to display          MMRC:     No data to display          Epworth:      No data to display          Tests:   FENO:  No results found for: "NITRICOXIDE"  PFT:    Latest Ref Rng & Units 07/10/2021    9:35 AM 09/11/2020    8:33 AM  PFT Results  FVC-Pre L 1.10  1.28   FVC-Predicted Pre % 47  52   FVC-Post L 1.25    FVC-Predicted Post % 54    Pre FEV1/FVC % % 83  87   Post FEV1/FCV % % 83    FEV1-Pre L 0.91  1.11   FEV1-Predicted Pre % 50  57   FEV1-Post L 1.03    DLCO uncorrected ml/min/mmHg 8.68    DLCO UNC% % 47    DLCO corrected ml/min/mmHg 10.74    DLCO COR %Predicted % 59    DLVA Predicted % 89    TLC L 2.75    TLC % Predicted % 59    RV % Predicted % 77     07/10/21 personally reviewed interpreted as: Spirometry indicative of severe restriction versus air trapping without significant bronchodilator response.  Lung volumes confirm moderate restriction.  DLCO is moderately reduced when corrected for hemoglobin.  Prior spirometry 09/2020 reviewed interpreted as no fixed obstruction, suggestive of moderate restriction versus gas trapping.  WALK:      No data to display          Imaging: Personally reviewed and as per EMR discussion this note No results found.  Lab Results: Personally reviewed CBC    Component Value Date/Time   WBC 6.3 10/23/2021 1505   RBC 2.75 (L) 10/23/2021 1505   HGB 9.2 (L) 10/23/2021 1505   HCT 28.4 (L) 10/23/2021 1505   PLT 212 10/23/2021 1505   MCV 103.3 (H) 10/23/2021 1505   MCV 94.5 10/12/2012 1211   MCH 33.5 10/23/2021 1505   MCHC 32.4 10/23/2021 1505   RDW 14.9 10/23/2021 1505   LYMPHSABS 1.1  10/20/2021 1609   MONOABS 0.6 10/20/2021 1609   EOSABS 0.9 (H) 10/20/2021 1609   BASOSABS 0.0 10/20/2021 1609    BMET    Component Value Date/Time   NA 135 10/23/2021 1505   K 4.0 10/23/2021 1505   CL 97 (L) 10/23/2021 1505   CO2 30 10/23/2021 1505   GLUCOSE 156 (H) 10/23/2021 1505   BUN 7 (L) 10/23/2021 1505   CREATININE 2.67 (H) 10/23/2021 1505   CREATININE 1.34 (H) 09/18/2011 1400   CALCIUM 9.1 10/23/2021 1505   GFRNONAA 20 (L) 10/23/2021 1505   GFRAA 16 (L) 11/09/2019 1152    BNP    Component Value Date/Time   BNP >4,500.0 (  H) 10/20/2021 1609    ProBNP No results found for: "PROBNP"  Specialty Problems       Pulmonary Problems   Asthma   OSA (obstructive sleep apnea)   Acute on chronic respiratory failure with hypoxia (HCC)    Allergies  Allergen Reactions   3-Methyl-2-Benzothiazolinone Hydrazone Other (See Comments)    Muscle weakness muscle cramping in legs Other reaction(s): Myalgias (Muscle Pain)   Banana Anaphylaxis, Swelling and Other (See Comments)    TONGUE AND MOUTH SWELLING   Black Walnut Flavor Anaphylaxis and Itching    Walnuts   Hazelnut (Filbert) Allergy Skin Test Anaphylaxis    Hazelnuts   Leflunomide And Related Other (See Comments)    Severe Headache   Lisinopril Swelling    Angioedema  Swelling of the face   No [employer] Food Allergies Anaphylaxis    Spicy Mustard 4.7.2021 Pt reports that she eats Regular Yellow Mustard Swelling and itching of tongue   Other Other (See Comments), Anaphylaxis and Swelling    Cosentyx= angioedema  Other reaction(s): Facial Edema (intolerance) Mouth itches and tongue swelling Hazel nuts and pecans also Walnuts Walnuts Renal failure Other reaction(s): Other Serotonin Syndrome  Serotonin syndrome  Tremors Other reaction(s): Other Tremors Muscle weakness muscle cramping in legs Other reaction(s): Myalgias (Muscle Pain) Mouth itches and tongue swelling Hazel nuts and pecans  also Walnuts    Pecan Extract Allergy Skin Test Anaphylaxis and Itching    Pecans    Pecan Nut (Diagnostic) Anaphylaxis and Itching    Pecans   Trazodone And Nefazodone Other (See Comments)    Serotonin Syndrome  Tremors   Adalimumab Other (See Comments)    Blisters on abdomen =humira   Escitalopram Oxalate Other (See Comments)    Hand problems - Serotonin Syndrome     Ezetimibe Other (See Comments)    myalgias cramps    Secukinumab Swelling    Cosentyx = angiodema    Statins Other (See Comments)    Leg pains and weakness   Ferrlecit [Na Ferric Gluc Cplx In Sucrose]     Not reaction given from HD   Gabapentin Other (See Comments)    tremors   Ibuprofen Other (See Comments)    Renal Problems   Duloxetine Rash    Immunization History  Administered Date(s) Administered   Influenza Split 12/25/2008, 11/12/2010, 11/06/2012, 11/10/2013, 01/02/2014, 11/11/2015, 11/13/2017, 10/29/2018   Influenza, Seasonal, Injecte, Preservative Fre 12/25/2008, 11/12/2010, 11/06/2012   Influenza,inj,Quad PF,6+ Mos 01/02/2014, 11/13/2017, 10/29/2018   Influenza,inj,Quad PF,6-35 Mos 11/24/2011, 10/27/2012, 11/03/2012, 10/29/2018   Influenza,inj,quad, With Preservative 11/01/2019   Influenza-Unspecified 12/25/2008, 11/12/2010, 11/06/2012, 11/10/2013, 01/02/2014, 11/11/2015, 11/13/2017, 10/29/2018   Moderna Sars-Covid-2 Vaccination 04/30/2019, 06/01/2019   Pneumococcal Conjugate-13 03/17/2010, 07/01/2019   Pneumococcal Polysaccharide-23 02/10/2005, 12/24/2008, 12/11/2012, 04/21/2013, 03/09/2020   Tdap 01/13/2018    Past Medical History:  Diagnosis Date   Anemia of chronic disease    Anxiety    Arthritis    oa and psoriatic ra   Asthma    Back pain, chronic    lower back   Candida infection finished tx 2 days ago   Chest pain 07/16/2021   Chronic insomnia    COPD (chronic obstructive pulmonary disease) (HCC)    Coronary artery calcification seen on CT scan    Diabetes mellitus    type  2   Eczema    ESRD on dialysis ([facility])    Essential hypertension    GERD (gastroesophageal reflux disease)    Gout    History of hiatal  hernia    Hypoalbuminemia    Hyponatremia    Melena 09/20/2017   Mild aortic stenosis    Mitral regurgitation    Mitral stenosis    Obesity    PAD (peripheral artery disease) (HCC)    a. externial iliac calcification seen on CT 04/2020.   Pneumonia few yrs ago x 2   Sleep apnea    Tobacco abuse    Upper GI bleed 05/2019    Tobacco History: Social History   Tobacco Use  Smoking Status Former   Packs/day: 0.50   Years: 40.00   Total pack years: 20.00   Types: Cigarettes   Quit date: 08/2020   Years since quitting: 1.2  Smokeless Tobacco Never   Counseling given: Not Answered   Continue to not smoke  Outpatient Encounter Medications as of 11/21/2021  Medication Sig   acetaminophen (TYLENOL) 500 MG tablet Take 1,000 mg by mouth daily as needed (pain).   albuterol (PROVENTIL) (2.5 MG/3ML) 0.083% nebulizer solution Take 3 mLs (2.5 mg total) by nebulization every 6 (six) hours as needed for wheezing or shortness of breath.   allopurinol (ZYLOPRIM) 100 MG tablet Take 100 mg by mouth every evening.   aspirin EC 81 MG tablet Take 1 tablet (81 mg total) by mouth daily. Swallow whole.   calcium acetate (PHOSLO) 667 MG capsule Take 1,334 mg by mouth 3 (three) times daily with meals.   carboxymethylcellulose 1 % ophthalmic solution Place 2 drops into both eyes 2 (two) times daily as needed (dry eyes).   cetirizine (ZYRTEC) 10 MG tablet Take 10 mg by mouth daily as needed for allergies.   cholecalciferol (VITAMIN D) 25 MCG tablet Take 1 tablet (1,000 Units total) by mouth every evening.   cholecalciferol (VITAMIN D3) 10 MCG (400 UNIT) TABS tablet Take 800 Units by mouth daily.   cinacalcet (SENSIPAR) 60 MG tablet Take 60 mg by mouth daily.   cloNIDine (CATAPRES) 0.3 MG tablet Take 0.3 mg by mouth 3 (three) times daily.   cyclobenzaprine (FLEXERIL)  10 MG tablet Take 10 mg by mouth 2 (two) times daily as needed for muscle spasms.   Darbepoetin Alfa (ARANESP) 200 MCG/0.4ML SOSY injection Inject 0.4 mLs (200 mcg total) into the vein every Wednesday with hemodialysis.   diclofenac Sodium (VOLTAREN) 1 % GEL Apply 2 g topically daily as needed (pain).   docusate sodium (COLACE) 100 MG capsule Take 100-200 mg by mouth daily as needed for mild constipation.   ferrous sulfate 325 (65 FE) MG tablet Take 325 mg by mouth See admin instructions. Take 325 mg by mouth three times a week Tues, Thur, Sat   folic acid (FOLVITE) 283 MCG tablet Take 400 mcg by mouth daily.   heparin 1000 unit/mL SOLN injection 3 mLs (3,000 Units total) by Dialysis route as needed (in dialysis as needed).   heparin 1000 unit/mL SOLN injection 2 mLs (2,000 Units total) by Dialysis route as needed (in dialysis as needed).   heparin 1000 unit/mL SOLN injection 1 mL (1,000 Units total) by Dialysis route as needed (in dialysis).   hydrALAZINE (APRESOLINE) 100 MG tablet Take 1 tablet (100 mg total) by mouth every 8 (eight) hours. (Patient taking differently: Take 100 mg by mouth 3 (three) times daily.)   hydrocortisone (ANUSOL-HC) 2.5 % rectal cream Place rectally 2 (two) times daily. (Patient taking differently: Place 1 Application rectally daily as needed for hemorrhoids or anal itching.)   isosorbide mononitrate (IMDUR) 60 MG 24 hr tablet Take 1 tablet (  60 mg total) by mouth daily.   labetalol (NORMODYNE) 200 MG tablet Take 2 tablets (400 mg total) by mouth 3 (three) times daily.   lidocaine (LIDODERM) 5 % Place 1 patch onto the skin every 12 (twelve) hours as needed. Remove & Discard patch within 12 hours or as directed by MD (Patient taking differently: Place 1 patch onto the skin daily as needed (pain). Remove & Discard patch within 12 hours or as directed by MD)   LORazepam (ATIVAN) 1 MG tablet Take 1 mg by mouth every 8 (eight) hours as needed for anxiety.   montelukast  (SINGULAIR) 10 MG tablet Take 10 mg by mouth daily with supper.    NIFEdipine (PROCARDIA XL/NIFEDICAL XL) 60 MG 24 hr tablet Take 60 mg by mouth 2 (two) times daily.   olmesartan (BENICAR) 40 MG tablet Take 40 mg by mouth every evening.   ondansetron (ZOFRAN) 4 MG tablet Take 4 mg by mouth 2 (two) times daily as needed for nausea or vomiting.   OVER THE COUNTER MEDICATION Take by mouth daily as needed (sickness). Elderberry Fruit and flower 1 dropper full by mouth daily as needed for sickness   Oxycodone HCl 10 MG TABS Take 10 mg by mouth daily as needed for pain.   OXYGEN Inhale 2 L into the lungs as directed.   pantoprazole (PROTONIX) 40 MG tablet Take 40 mg by mouth daily.   predniSONE (DELTASONE) 5 MG tablet Take 4 tablets (20 mg total) by mouth daily with breakfast for 2 days, THEN 1 tablet (5 mg total) daily with breakfast. (Patient taking differently: Take 1 tablet (5 mg total) daily with breakfast.)   Probiotic Product (PROBIOTIC PO) Take 1 capsule by mouth daily.   sorbitol 70 % SOLN Take 30 ml 1-2 times daily as needed for constipation   Tiotropium Bromide-Olodaterol (STIOLTO RESPIMAT) 2.5-2.5 MCG/ACT AERS Inhale 2 puffs once daily into the lungs.   VENTOLIN HFA 108 (90 Base) MCG/ACT inhaler Inhale 2 puffs into the lungs See admin instructions. Take 2 puffs every 4-6 hours as needed for wheezing and shortness of breath   zolpidem (AMBIEN) 5 MG tablet Take 5 mg by mouth at bedtime as needed for sleep.   gabapentin (NEURONTIN) 300 MG capsule Take 1 capsule (300 mg total) by mouth at bedtime as needed (Neuropathic pain). (Patient taking differently: Take 300 mg by mouth daily as needed (Neuropathic pain).)   No facility-administered encounter medications on file as of 11/21/2021.     Review of Systems  Review of Systems  N/a Physical Exam  BP 138/68 (BP Location: Left Arm, Patient Position: Sitting, Cuff Size: Normal)   Pulse 69   Temp 98.2 F (36.8 C) (Oral)   Wt 137 lb 3.2 oz  (62.2 kg)   SpO2 97%   BMI 25.09 kg/m   Wt Readings from Last 5 Encounters:  11/21/21 137 lb 3.2 oz (62.2 kg)  10/25/21 134 lb 14.7 oz (61.2 kg)  10/15/21 138 lb (62.6 kg)  10/02/21 141 lb 1.5 oz (64 kg)  09/12/21 141 lb 6.4 oz (64.1 kg)    BMI Readings from Last 5 Encounters:  11/21/21 25.09 kg/m  10/25/21 24.68 kg/m  10/15/21 25.24 kg/m  10/02/21 25.81 kg/m  09/12/21 25.25 kg/m     Physical Exam Sitting in chair, in no acute distress Eyes: EOMI, no icterus Neck: Supple, JVP Pulmonary: Normal work of breathing, clear Abdomen: Nondistended, bowel sounds present MSK: No synovitis, joint effusion Abdomen: Nondistended, bowel sounds present Neuro: Normal  gait, no weakness Psych: Normal mood, full affect   Assessment & Plan:   Dyspnea on exertion: Likely related to cardiac causes as primary driver given left atrial dilation, elevated left atrial pressure, mitral and aortic valvular abnormalities with serial chest imaging demonstrating pulmonary edema.  Prior spirometry without fixed obstruction.  Full PFT 07/10/2021 concerning for moderate restriction with moderately decreased DLCO concerning for ILD.  CT high res 07/2021 rules out ILD, none present parenchyma is clear.  While TTE 06/2021 estimated normal RV pressures, preceding TTE 12/2020 and TTE 07/2021 demonstrate elevated RV pressures - so suspect 06/2021 results are spurious.  Overall, pulmonary testing is revealed that the lungs are small contributor to her dyspnea.  She has not  benefited inhaler therapy.  Large suspicion that cardiac is primary driver of her symptoms as well as sequela of ESRD on HD.  Chronic hypoxemic respiratory failure: 2-3 L with exertion per her report.  Using POC. No ILD on serial images including CT high res 07/2021.  Imaging consistently shows pulmonary edema and volume overload.  Recent hospitalization for worsening in the setting of volume overload and missed dialysis sessions.  Right lower lobe  infiltrate favored to represent atelectasis.  Lungs are clear on exam.  Possible pulmonary hypertension: Multifactorial and largely by group 3 disease exacerbated by dialysis, grade 5 disease with possible increase post AV fistula, group 3 disease with hypoxemia.  Do not think she is good candidate for treatment of multifactorial nature of disease and dilated left atrium.   Lymphadenopathy: Subcarinal.  Stable 12/30/2020 to 10/30/2021.  They are very concerned about this.  Suspect this is reactive to what sounds like preceding viral issues and likely reactive to chronic pulmonary venous congestion volume overload.  Can repeat scan 04/2021 and if enlarging will refer to oncology.   Return in about 3 months (around 02/21/2022).   Lanier Clam, MD 11/21/2021   I spent 48 minutes in the care of the patient including face-to-face visit, review of records, coordination of care.

## 2021-11-25 ENCOUNTER — Telehealth: Payer: Self-pay | Admitting: Pulmonary Disease

## 2021-11-25 NOTE — Telephone Encounter (Signed)
Called and left voicemail for Tonya to call office back to let me know what verbal orders she was needing form Dr Silas Flood in regards to outpatient therapy.

## 2021-11-25 NOTE — Telephone Encounter (Signed)
Jodi Bell  [phone]  Patient evaluated for occupational therapy looking at 2 times a week for 1 week for ADL transfers exercise and pain control

## 2021-11-25 NOTE — Telephone Encounter (Signed)
Called and spoke with Jodi Bell from [name] and advised him that Dr Silas Flood is ok with doing occupational therapy for 2x a week for 1 week for ADLs, transferring and pain control. He verbalized understanding. Nothing further needed

## 2021-11-26 ENCOUNTER — Telehealth: Payer: Self-pay | Admitting: *Deleted

## 2021-11-26 ENCOUNTER — Other Ambulatory Visit: Payer: Self-pay | Admitting: Cardiology

## 2021-11-26 ENCOUNTER — Telehealth: Payer: Self-pay | Admitting: Pulmonary Disease

## 2021-11-26 NOTE — Telephone Encounter (Signed)
Jodi Bell (from [place]) [phone] is calling to get approval for nursing visits 1 time a week for 2 weeks. Please call Kenney Houseman to verify for approval

## 2021-11-26 NOTE — Telephone Encounter (Signed)
Called and spoke with Jodi Bell and he told me he needed verbal order to continue PT for patient until she goes outpatient till Nov 7. So I told him that was fine to continue to see her till she goes out patient that [facility] had no issues with that. Nothing further needed

## 2021-11-26 NOTE — Telephone Encounter (Signed)
Roselie Awkward with [facility] called to speak with the doctor regarding patient's PT assessment today.  Please call to discuss further at [phone]

## 2021-11-26 NOTE — Telephone Encounter (Signed)
Called and spoke to Jodi Bell from [place] and advised her that Dr Silas Flood is ok to get nursing visits 1 time a week for 2 weeks. I advised Jodi Bell to let us know if she is needing more visits. Nothing further needed

## 2021-11-27 ENCOUNTER — Encounter: Payer: Self-pay | Admitting: Pulmonary Disease

## 2021-11-28 ENCOUNTER — Telehealth (HOSPITAL_COMMUNITY): Payer: Self-pay

## 2021-11-28 MED ORDER — STIOLTO RESPIMAT 2.5-2.5 MCG/ACT IN AERS: 2.0000 | INHALATION_SPRAY | Freq: Every day | RESPIRATORY_TRACT | 3 refills | Status: DC

## 2021-11-28 NOTE — Telephone Encounter (Signed)
Yes - ok to send to different pharmacy

## 2021-11-28 NOTE — Telephone Encounter (Signed)
Rx has been sent to pharmacy for pt. Nothing further needed.

## 2021-11-28 NOTE — Telephone Encounter (Signed)
Dr. Silas Flood, please advise if ok to send in Shannon to pt's pharmacy. This has not been filled before by [facility]. Thanks.

## 2021-11-28 NOTE — Telephone Encounter (Signed)
Pt insurance is active and benefits verified through [place] $15, DED 0/0 met, out of pocket $6,700/$6,700 met, co-insurance 0%. no pre-authorization required, Josh/[employer] 11/28/2021_0 :23am, REF# [id number]

## 2021-12-16 ENCOUNTER — Encounter (HOSPITAL_COMMUNITY): Payer: Self-pay

## 2021-12-22 ENCOUNTER — Other Ambulatory Visit: Payer: Self-pay

## 2021-12-22 ENCOUNTER — Emergency Department (HOSPITAL_COMMUNITY): Payer: Medicare PPO

## 2021-12-22 ENCOUNTER — Emergency Department (HOSPITAL_COMMUNITY)
Admission: EM | Admit: 2021-12-22 | Discharge: 2021-12-23 | Discharge status: Against Medical Advice | Payer: Medicare PPO | Attending: Emergency Medicine | Admitting: Emergency Medicine

## 2021-12-22 ENCOUNTER — Encounter (HOSPITAL_COMMUNITY): Payer: Self-pay | Admitting: Pharmacy Technician

## 2021-12-22 DIAGNOSIS — N186 End stage renal disease: Secondary | ICD-10-CM | POA: Diagnosis not present

## 2021-12-22 DIAGNOSIS — Z992 Dependence on renal dialysis: Secondary | ICD-10-CM | POA: Diagnosis not present

## 2021-12-22 DIAGNOSIS — M546 Pain in thoracic spine: Secondary | ICD-10-CM | POA: Insufficient documentation

## 2021-12-22 DIAGNOSIS — J029 Acute pharyngitis, unspecified: Secondary | ICD-10-CM | POA: Diagnosis not present

## 2021-12-22 DIAGNOSIS — R079 Chest pain, unspecified: Secondary | ICD-10-CM | POA: Diagnosis present

## 2021-12-22 LAB — COMPREHENSIVE METABOLIC PANEL
ALT: 9 U/L (ref 0–44)
AST: 11 U/L — ABNORMAL LOW (ref 15–41)
Albumin: 2.4 g/dL — ABNORMAL LOW (ref 3.5–5.0)
Alkaline Phosphatase: 98 U/L (ref 38–126)
Anion gap: 12 (ref 5–15)
BUN: 31 mg/dL — ABNORMAL HIGH (ref 8–23)
CO2: 26 mmol/L (ref 22–32)
Calcium: 8 mg/dL — ABNORMAL LOW (ref 8.9–10.3)
Chloride: 101 mmol/L (ref 98–111)
Creatinine, Ser: 6.09 mg/dL — ABNORMAL HIGH (ref 0.44–1.00)
GFR, Estimated: 7 mL/min — ABNORMAL LOW (ref >60)
Glucose, Bld: 102 mg/dL — ABNORMAL HIGH (ref 70–99)
Potassium: 4.9 mmol/L (ref 3.5–5.1)
Sodium: 139 mmol/L (ref 135–145)
Total Bilirubin: 0.1 mg/dL — ABNORMAL LOW (ref 0.3–1.2)
Total Protein: 5.2 g/dL — ABNORMAL LOW (ref 6.5–8.1)

## 2021-12-22 LAB — TROPONIN I (HIGH SENSITIVITY): Troponin I (High Sensitivity): 17 ng/L (ref <18)

## 2021-12-22 NOTE — ED Provider Triage Note (Signed)
Emergency Medicine Provider Triage Evaluation Note  Jodi Bell , a 61 y.o. female  was evaluated in triage.  Pt complains of chest pain. Pain iin throat /neck, radiates to her back.felt like her airway was closing earlier so she took albuterol. ESRD M/w/F for many rears.  Chronic resp failure   Review of Systems  Positive: cp Negative: fever  Physical Exam  BP (!) 225/76 (BP Location: Right Arm)   Pulse 66   Temp 98.5 F (36.9 C)   Resp 16   SpO2 100%  Gen:   Awake, no distress   Resp:  Normal effort  MSK:   Moves extremities without difficulty  Other:    Medical Decision Making  Medically screening exam initiated at 7:08 PM.  Appropriate orders placed.  Jodi Bell was informed that the remainder of the evaluation will be completed by another provider, this initial triage assessment does not replace that evaluation, and the importance of remaining in the ED until their evaluation is complete.     Margarita Mail, PA-C 12/22/21 1911

## 2021-12-22 NOTE — ED Notes (Signed)
Pt left AMA. Pt said wait was took long. Pt said she will okay without oxygen until she get home. Pt encouraged to stay.

## 2021-12-22 NOTE — ED Triage Notes (Signed)
Pt here via EMS for sore throat and upper back pain. Pt in NAD. Respirations even and unlabored, able to manage her secretions.  Wears 3L [place] at baseline.  200/78 HR 68 100% 3L   Pt reports pain from her chest to her back and into her neck. Pt hypertensive to 572 systolic in triage.

## 2021-12-24 ENCOUNTER — Telehealth (HOSPITAL_COMMUNITY): Payer: Self-pay | Admitting: *Deleted

## 2021-12-24 NOTE — Telephone Encounter (Signed)
Called Jodi Bell to speak with her about her orientation to the PR program. I was not able to reach her or leave a message. According to her appointments she is still in physical therapy until the first part of December. She will not be able to attend two rehab therapies at one time. I will attempt to call her again in the morning.

## 2021-12-25 ENCOUNTER — Telehealth (HOSPITAL_COMMUNITY): Payer: Self-pay | Admitting: *Deleted

## 2021-12-25 ENCOUNTER — Ambulatory Visit (HOSPITAL_COMMUNITY): Payer: Medicare PPO

## 2021-12-25 NOTE — Telephone Encounter (Signed)
Attempted to call Jodi Bell this again this morning. I was not able to reach her. I was able to get through to her phone to be able to identify myself and reason for the call. After that there was no connection to to her.

## 2021-12-26 ENCOUNTER — Telehealth (HOSPITAL_COMMUNITY): Payer: Self-pay | Admitting: *Deleted

## 2021-12-26 NOTE — Telephone Encounter (Signed)
Jodi Bell called to say that she had forgotten her appointment for the PR orientation.I explained to her that she would not be able to have two rehab therapy sessions on the same day. She is attending physical therapy on Tuesdays and Thursdays and coming here it would be that same days. I explained that she would have to wait until she finished her physical therapy sessions before she could start pulmonary rehab. I encouraged her to let her pulmonologist know and they could send another referral. She voiced understanding.

## 2021-12-31 ENCOUNTER — Ambulatory Visit (HOSPITAL_COMMUNITY): Payer: Medicare PPO

## 2022-01-07 ENCOUNTER — Ambulatory Visit (HOSPITAL_COMMUNITY): Payer: Medicare PPO

## 2022-01-09 ENCOUNTER — Ambulatory Visit (HOSPITAL_COMMUNITY): Payer: Medicare PPO

## 2022-01-14 ENCOUNTER — Ambulatory Visit (HOSPITAL_COMMUNITY): Payer: Medicare PPO

## 2022-01-16 ENCOUNTER — Ambulatory Visit (HOSPITAL_COMMUNITY): Payer: Medicare PPO

## 2022-01-21 ENCOUNTER — Ambulatory Visit (HOSPITAL_COMMUNITY): Payer: Medicare PPO

## 2022-01-23 ENCOUNTER — Ambulatory Visit (HOSPITAL_COMMUNITY): Payer: Medicare PPO

## 2022-01-24 ENCOUNTER — Telehealth (HOSPITAL_COMMUNITY): Payer: Self-pay

## 2022-01-24 NOTE — Telephone Encounter (Signed)
Called pt to see if she was interested in rescheduling for pulmonary rehab, pt stated that she is still in physical therapy and that she has lessen her oxygen and that she would call if she needed to participate in pulmonary rehab. Closed referral.

## 2022-01-28 ENCOUNTER — Ambulatory Visit (HOSPITAL_COMMUNITY): Payer: Medicare PPO

## 2022-01-30 ENCOUNTER — Ambulatory Visit (HOSPITAL_COMMUNITY): Payer: Medicare PPO

## 2022-02-04 ENCOUNTER — Ambulatory Visit (HOSPITAL_COMMUNITY): Payer: Medicare PPO

## 2022-02-05 ENCOUNTER — Emergency Department (HOSPITAL_BASED_OUTPATIENT_CLINIC_OR_DEPARTMENT_OTHER)
Admission: EM | Admit: 2022-02-05 | Discharge: 2022-02-05 | Disposition: A | Discharge status: Home / Self Care | Payer: Medicare PPO | Attending: Emergency Medicine | Admitting: Emergency Medicine

## 2022-02-05 ENCOUNTER — Encounter (HOSPITAL_BASED_OUTPATIENT_CLINIC_OR_DEPARTMENT_OTHER): Payer: Self-pay | Admitting: Emergency Medicine

## 2022-02-05 ENCOUNTER — Other Ambulatory Visit: Payer: Self-pay

## 2022-02-05 DIAGNOSIS — Z87891 Personal history of nicotine dependence: Secondary | ICD-10-CM | POA: Insufficient documentation

## 2022-02-05 DIAGNOSIS — N186 End stage renal disease: Secondary | ICD-10-CM | POA: Insufficient documentation

## 2022-02-05 DIAGNOSIS — E1122 Type 2 diabetes mellitus with diabetic chronic kidney disease: Secondary | ICD-10-CM | POA: Insufficient documentation

## 2022-02-05 DIAGNOSIS — M542 Cervicalgia: Secondary | ICD-10-CM | POA: Diagnosis present

## 2022-02-05 DIAGNOSIS — Z992 Dependence on renal dialysis: Secondary | ICD-10-CM | POA: Diagnosis not present

## 2022-02-05 DIAGNOSIS — I120 Hypertensive chronic kidney disease with stage 5 chronic kidney disease or end stage renal disease: Secondary | ICD-10-CM | POA: Insufficient documentation

## 2022-02-05 DIAGNOSIS — Z79899 Other long term (current) drug therapy: Secondary | ICD-10-CM | POA: Diagnosis not present

## 2022-02-05 DIAGNOSIS — I2510 Atherosclerotic heart disease of native coronary artery without angina pectoris: Secondary | ICD-10-CM | POA: Diagnosis not present

## 2022-02-05 DIAGNOSIS — Z96652 Presence of left artificial knee joint: Secondary | ICD-10-CM | POA: Insufficient documentation

## 2022-02-05 DIAGNOSIS — Z7982 Long term (current) use of aspirin: Secondary | ICD-10-CM | POA: Insufficient documentation

## 2022-02-05 DIAGNOSIS — J449 Chronic obstructive pulmonary disease, unspecified: Secondary | ICD-10-CM | POA: Diagnosis not present

## 2022-02-05 DIAGNOSIS — M5412 Radiculopathy, cervical region: Secondary | ICD-10-CM | POA: Insufficient documentation

## 2022-02-05 MED ORDER — ONDANSETRON HCL 4 MG/2ML IJ SOLN
4.0000 mg | Freq: Once | INTRAMUSCULAR | Status: AC
  Administered 2022-02-05: 4 mg via INTRAVENOUS
  Filled 2022-02-05: qty 2

## 2022-02-05 MED ORDER — FENTANYL CITRATE PF 50 MCG/ML IJ SOSY
50.0000 ug | PREFILLED_SYRINGE | Freq: Once | INTRAMUSCULAR | Status: AC
  Administered 2022-02-05: 50 ug via INTRAVENOUS
  Filled 2022-02-05: qty 1

## 2022-02-05 NOTE — ED Provider Notes (Signed)
[facility] Provider Note: Georgena Spurling, MD, FACEP  CSN: [id number] MRN: [id number] ARRIVAL: 02/05/22 at [place]: [place]  Shoulder Pain and Arm Pain   HISTORY OF PRESENT ILLNESS  02/05/22 5:53 AM Jodi Bell is a 61 y.o. female with end-stage renal disease on hemodialysis.  She got up this morning to go to dialysis but was having pain in her left neck radiating to her left shoulder and left upper arm.  She is not having any paresthesias or weakness in the left arm.  She does not recall any injury to her neck or arm.  The pain is not worse with movement of the left shoulder.  She rates her pain as a 7 out of 10.  She is on chronic oxycodone for chronic lower back pain but did not take any this morning.  She is not having shortness of breath.   Past Medical History:  Diagnosis Date   Anemia of chronic disease    Anxiety    Arthritis    oa and psoriatic ra   Asthma    Back pain, chronic    lower back   Candida infection finished tx 2 days ago   Chest pain 07/16/2021   Chronic insomnia    COPD (chronic obstructive pulmonary disease) ([place])    Coronary artery calcification seen on CT scan    Diabetes mellitus    type 2   Eczema    ESRD on dialysis [facility])    Essential hypertension    GERD (gastroesophageal reflux disease)    Gout    History of hiatal hernia    Hypoalbuminemia    Hyponatremia    Melena 09/20/2017   Mild aortic stenosis    Mitral regurgitation    Mitral stenosis    Obesity    PAD (peripheral artery disease) (H[facility])    a. externial iliac calcification seen on CT 04/2020.   Pneumonia few yrs ago x 2   Sleep apnea    Tobacco abuse    Upper GI bleed 05/2019    Past Surgical History:  Procedure Laterality Date   BACK SURGERY  2016   lower back fusion with cage   BIOPSY  09/23/2017   Procedure: BIOPSY;  Surgeon: Wonda Horner, MD;  Location: [facility];  Service: Endoscopy;;   BIOPSY  05/19/2019   Procedure: BIOPSY;   Surgeon: Otis Brace, MD;  Location: [place];  Service: Gastroenterology;;   CESAREAN SECTION  1990   x 1    COLONOSCOPY WITH PROPOFOL N/A 09/23/2017   Procedure: COLONOSCOPY WITH PROPOFOL Hemostatic clips placed;  Surgeon: Wonda Horner, MD;  Location: [facility];  Service: Endoscopy;  Laterality: N/A;   COLONOSCOPY WITH PROPOFOL N/A 10/02/2020   Procedure: COLONOSCOPY WITH PROPOFOL;  Surgeon: Arta Silence, MD;  Location: [place];  Service: Endoscopy;  Laterality: N/A;   CORONARY ARTERY BYPASS GRAFT N/A 09/13/2020   Procedure: CORONARY ARTERY BYPASS GRAFTING (CABG), ON PUMP, TIMES TWO, USING LEFT INTERNAL MAMMARY ARTERY AND RIGHT ENDOSCOPICALLY HARVESTED GREATER SAPHENOUS VEIN;  Surgeon: Gaye Pollack, MD;  Location: [place];  Service: Open Heart Surgery;  Laterality: N/A;   DILATION AND CURETTAGE OF UTERUS  1988   ENTEROSCOPY N/A 07/22/2021   Procedure: ENTEROSCOPY;  Surgeon: Ronnette Juniper, MD;  Location: [place];  Service: Gastroenterology;  Laterality: N/A;   ESOPHAGOGASTRODUODENOSCOPY N/A 09/22/2020   Procedure: ESOPHAGOGASTRODUODENOSCOPY (EGD);  Surgeon: Arta Silence, MD;  Location: [facility] END[facility]COPY;  Service: Endoscopy;  Laterality:  N/A;   ESOPHAGOGASTRODUODENOSCOPY (EGD) WITH PROPOFOL N/A 09/23/2017   Procedure: ESOPHAGOGASTRODUODENOSCOPY (EGD) WITH PROPOFOL;  Surgeon: Wonda Horner, MD;  [facility];  Service: Endoscopy;  Laterality: N/A;   ESOPHAGOGASTRODUODENOSCOPY (EGD) WITH PROPOFOL N/A 05/19/2019   Procedure: ESOPHAGOGASTRODUODENOSCOPY (EGD) WITH PROPOFOL;  Surgeon: Otis Brace, MD;  Location: [place];  Service: Gastroenterology;  Laterality: N/A;   ESOPHAGOGASTRODUODENOSCOPY (EGD) WITH PROPOFOL N/A 10/02/2020   Procedure: ESOPHAGOGASTRODUODENOSCOPY (EGD) WITH PROPOFOL;  Surgeon: Arta Silence, MD;  Location: [place];  Service: Endoscopy;  Laterality: N/A;   GIVENS CAPSULE STUDY N/A 05/19/2019   Procedure: GIVENS CAPSULE STUDY;  Surgeon:  Otis Brace, MD;  Location: [place];  Service: Gastroenterology;  Laterality: N/A;   GIVENS CAPSULE STUDY N/A 07/19/2021   Procedure: GIVENS CAPSULE STUDY;  Surgeon: Clarene Essex, MD;  Location: [place];  Service: Gastroenterology;  Laterality: N/A;   HEMOSTASIS CLIP PLACEMENT  09/22/2020   Procedure: HEMOSTASIS CLIP PLACEMENT;  Surgeon: Arta Silence, MD;  Location: [place];  Service: Endoscopy;;   HEMOSTASIS CONTROL  09/22/2020   Procedure: HEMOSTASIS CONTROL;  Surgeon: Arta Silence, MD;  Location: [place];  Service: Endoscopy;;   HOT HEMOSTASIS N/A 05/19/2019   Procedure: HOT HEMOSTASIS (ARGON PLASMA COAGULATION/BICAP);  Surgeon: Otis Brace, MD;  Location: [facility] ENDOSCOPY;  Service: Gastroenterology;  Laterality: N/A;   LEFT HEART CATH AND CORONARY ANGIOGRAPHY N/A 09/10/2020   Procedure: LEFT HEART CATH AND CORONARY ANGIOGRAPHY;  Surgeon: Nelva Bush, MD;  Location: [place] [facility];  Service: Cardiovascular;  Laterality: N/A;   PARTIAL KNEE ARTHROPLASTY Left 11/12/2017   Procedure: left unicompartmental arthroplasty-medial;  Surgeon: Paralee Cancel, MD;  Location: [facility];  Service: Orthopedics;  Laterality: Left;  83min   SUBMUCOSAL INJECTION  09/23/2017   Procedure: SUBMUCOSAL INJECTION;  Surgeon: Wonda Horner, MD;  Location: [facility];  Service: Endoscopy;;  in colon   TEE WITHOUT CARDIOVERSION N/A 09/13/2020   Procedure: TRANSESOPHAGEAL ECHOCARDIOGRAM (TEE);  Surgeon: Gaye Pollack, MD;  Location: [place];  Service: Open Heart Surgery;  Laterality: N/A;   TUBAL LIGATION  1990    Family History  Problem Relation Age of Onset   Lung cancer Mother    Diabetes Sister    Heart disease Sister    Asthma Daughter    Arthritis Daughter    Arthritis Daughter    Thyroid cancer Other        1/2 sister   Heart disease Other        1/2 sister    Social History   Tobacco Use   Smoking status: Former    Packs/day: 0.50    Years: 40.00    Total pack  years: 20.00    Types: Cigarettes    Quit date: 08/2020    Years since quitting: 1.4   Smokeless tobacco: Never  Vaping Use   Vaping Use: Never used  Substance Use Topics   Alcohol use: Yes    Comment: occ   Drug use: No    Prior to Admission medications   Medication Sig Start Date End Date Taking? Authorizing Provider  acetaminophen (TYLENOL) 500 MG tablet Take 1,000 mg by mouth daily as needed (pain).    [provider]  albuterol (PROVENTIL) (2.5 MG/3ML) 0.083% nebulizer solution Take 3 mLs (2.5 mg total) by nebulization every 6 (six) hours as needed for wheezing or shortness of breath. 07/10/21   Hunsucker, Bonna Gains, MD  allopurinol (ZYLOPRIM) 100 MG tablet Take 100 mg by mouth every evening.  [provider]  aspirin EC 81 MG tablet Take 1 tablet (81 mg total) by mouth daily. Swallow whole. 10/15/21   Leanor Kail, PA  calcium acetate (PHOSLO) 667 MG capsule Take 1,334 mg by mouth 3 (three) times daily with meals. 05/13/21   [provider]  carboxymethylcellulose 1 % ophthalmic solution Place 2 drops into both eyes 2 (two) times daily as needed (dry eyes).    [provider]  cetirizine (ZYRTEC) 10 MG tablet Take 10 mg by mouth daily as needed for allergies.    [provider]  cholecalciferol (VITAMIN D) 25 MCG tablet Take 1 tablet (1,000 Units total) by mouth every evening. 10/04/20   Gold, Wilder Glade, PA-C  cholecalciferol (VITAMIN D3) 10 MCG (400 UNIT) TABS tablet Take 800 Units by mouth daily.    [provider]  cinacalcet (SENSIPAR) 60 MG tablet Take 60 mg by mouth daily.    [provider]  cloNIDine (CATAPRES) 0.3 MG tablet Take 0.3 mg by mouth 3 (three) times daily.    [provider]  cyclobenzaprine (FLEXERIL) 10 MG tablet Take 10 mg by mouth 2 (two) times daily as needed for muscle spasms.    [provider]  Darbepoetin Alfa (ARANESP) 200 MCG/0.4ML SOSY injection Inject 0.4 mLs (200 mcg  total) into the vein every Wednesday with hemodialysis. 10/10/20   Jadene Pierini E, PA-C  diclofenac Sodium (VOLTAREN) 1 % GEL Apply 2 g topically daily as needed (pain).    [provider]  docusate sodium (COLACE) 100 MG capsule Take 100-200 mg by mouth daily as needed for mild constipation.    [provider]  ferrous sulfate 325 (65 FE) MG tablet Take 325 mg by mouth See admin instructions. Take 325 mg by mouth three times a week Tues, Thur, Sat    [provider]  folic acid (FOLVITE) 119 MCG tablet Take 400 mcg by mouth daily.    [provider]  gabapentin (NEURONTIN) 300 MG capsule Take 1 capsule (300 mg total) by mouth at bedtime as needed (Neuropathic pain). Patient taking differently: Take 300 mg by mouth daily as needed (Neuropathic pain). 12/14/17 10/21/21  Dana Allan I, MD  heparin 1000 unit/mL SOLN injection 3 mLs (3,000 Units total) by Dialysis route as needed (in dialysis as needed). 10/04/20   Jadene Pierini E, PA-C  heparin 1000 unit/mL SOLN injection 2 mLs (2,000 Units total) by Dialysis route as needed (in dialysis as needed). 10/04/20   Jadene Pierini E, PA-C  heparin 1000 unit/mL SOLN injection 1 mL (1,000 Units total) by Dialysis route as needed (in dialysis). 10/04/20   Jadene Pierini E, PA-C  hydrALAZINE (APRESOLINE) 100 MG tablet Take 1 tablet (100 mg total) by mouth every 8 (eight) hours. Patient taking differently: Take 100 mg by mouth 3 (three) times daily. 07/06/20   Cresenzo, Angelyn Punt, MD  hydrocortisone (ANUSOL-HC) 2.5 % rectal cream Place rectally 2 (two) times daily. Patient taking differently: Place 1 Application rectally daily as needed for hemorrhoids or anal itching. 07/23/21   [name], MD  isosorbide mononitrate (IMDUR) 60 MG 24 hr tablet Take 1 tablet (60 mg total) by mouth daily. 08/14/21   Bhagat, Crista Luria, PA  labetalol (NORMODYNE) 200 MG tablet Take 2 tablets (400 mg total) by mouth 3 (three) times daily. 07/11/21   Bhagat,  Bhavinkumar, PA  lidocaine (LIDODERM) 5 % Place 1 patch onto the skin every 12 (twelve) hours as needed. Remove & Discard patch within 12 hours or  as directed by MD Patient taking differently: Place 1 patch onto the skin daily as needed (pain). Remove & Discard patch within 12 hours or as directed by MD 07/23/21   [name], MD  LORazepam (ATIVAN) 1 MG tablet Take 1 mg by mouth every 8 (eight) hours as needed for anxiety. 06/25/20   [provider]  montelukast (SINGULAIR) 10 MG tablet Take 10 mg by mouth daily with supper.     [provider]  NIFEdipine (PROCARDIA XL/NIFEDICAL XL) 60 MG 24 hr tablet Take 60 mg by mouth 2 (two) times daily. 10/01/21   [provider]  olmesartan (BENICAR) 40 MG tablet Take 40 mg by mouth every evening. 05/06/19   [provider]  ondansetron (ZOFRAN) 4 MG tablet Take 4 mg by mouth 2 (two) times daily as needed for nausea or vomiting.    [provider]  OVER THE COUNTER MEDICATION Take by mouth daily as needed (sickness). Elderberry Fruit and flower 1 dropper full by mouth daily as needed for sickness    [provider]  Oxycodone HCl 10 MG TABS Take 10 mg by mouth daily as needed for pain. 01/11/21   [provider]  OXYGEN Inhale 2 L into the lungs as directed.    [provider]  pantoprazole (PROTONIX) 40 MG tablet Take 40 mg by mouth daily. 06/09/20   [provider]  Probiotic Product (PROBIOTIC PO) Take 1 capsule by mouth daily.    [provider]  sorbitol 70 % SOLN Take 30 ml 1-2 times daily as needed for constipation 07/23/21   [name], MD  Tiotropium Bromide-Olodaterol (STIOLTO RESPIMAT) 2.5-2.5 MCG/ACT AERS Inhale 2 puffs once daily into the lungs. 11/28/21   Hunsucker, Bonna Gains, MD  VENTOLIN HFA 108 (90 Base) MCG/ACT inhaler Inhale 2 puffs into the lungs See admin instructions. Take 2 puffs every 4-6 hours as needed for wheezing and shortness of breath 07/10/21    Hunsucker, Bonna Gains, MD  zolpidem (AMBIEN) 5 MG tablet Take 5 mg by mouth at bedtime as needed for sleep. 02/04/21   [provider]    Allergies 3-methyl-2-benzothiazolinone hydrazone, Banana, Black walnut flavor, Hazelnut (filbert) allergy skin test, Leflunomide and related, Lisinopril, No healthtouch food allergies, Other, Pecan extract allergy skin test, Pecan nut (diagnostic), Trazodone and nefazodone, Adalimumab, Escitalopram oxalate, Ezetimibe, Secukinumab, Statins, Ferrlecit [na ferric gluc cplx in sucrose], Gabapentin, Ibuprofen, and Duloxetine   REVIEW OF SYSTEMS  Negative except as noted here or in the History of Present Illness.   PHYSICAL EXAMINATION  Initial Vital Signs Blood pressure (!) 119/105, pulse 67, temperature 98.2 F (36.8 C), temperature source Oral, resp. rate 14, height 5' 2.75" (1.594 m), weight 63 kg, SpO2 100 %.  Examination General: Well-developed, well-nourished female in no acute distress; appearance consistent with age of record HENT: normocephalic; atraumatic Eyes: Normal appearance Neck: supple; movement of neck reproduces pain calmly particularly rotation to the right Heart: regular rate and rhythm Lungs: clear to auscultation bilaterally Abdomen: soft; nondistended; nontender; bowel sounds present Extremities: No deformity; movement of left shoulder does not reproduce pain; left forearm dialysis fistula with pulse and thrill Neurologic: Awake, alert and oriented; motor function intact in all extremities and symmetric; no facial droop Skin: Warm and dry Psychiatric: Flat affect   RESULTS  Summary of this visit's results, reviewed and interpreted by myself:   EKG Interpretation  Date/Time:  Wednesday February 05 2022 05:44:29 EST Ventricular Rate:  68 PR Interval:  184 QRS Duration:  93 QT Interval:  444 QTC Calculation: 473 R Axis:   -30 Text Interpretation: Sinus rhythm Probable left atrial enlargement Left ventricular  hypertrophy No significant change was found Confirmed by Jamareon Shimel [phone]) on 02/05/2022 5:46:46 AM       Laboratory Studies: No results found for this or any previous visit (from the past 24 hour(s)). Imaging Studies: No results found.  ED COURSE and MDM  Nursing notes, initial and subsequent vitals signs, including pulse oximetry, reviewed and interpreted by myself.  Vitals:   02/05/22 0542 02/05/22 0545  BP:  (!) 119/105  Pulse:  67  Resp:  14  Temp:  98.2 F (36.8 C)  TempSrc:  Oral  SpO2:  100%  Weight: 63 kg   Height: 5' 2.75" (1.594 m)    Medications  ondansetron (ZOFRAN) injection 4 mg (has no administration in time range)  fentaNYL (SUBLIMAZE) injection 50 mcg (has no administration in time range)   We will provide the patient with Zofran and fentanyl.  We have contacted her dialysis center and they will accept her after she is discharged here.  Her presentation is consistent with acute cervical radiculopathy.  We will refer to neurosurgery if symptoms persist.   PROCEDURES  Procedures   ED DIAGNOSES     ICD-10-CM   1. Cervical radiculopathy  M54.12          Keldan Eplin, Jenny Reichmann, MD 02/05/22 [phone]

## 2022-02-05 NOTE — ED Triage Notes (Signed)
Pt states woke up for dialysis. Left arm pain going down to forearm.

## 2022-02-06 ENCOUNTER — Ambulatory Visit (HOSPITAL_COMMUNITY): Payer: Medicare PPO

## 2022-02-11 ENCOUNTER — Ambulatory Visit (HOSPITAL_COMMUNITY): Payer: Medicare PPO

## 2022-02-13 ENCOUNTER — Ambulatory Visit (HOSPITAL_COMMUNITY): Payer: Medicare PPO

## 2022-02-18 ENCOUNTER — Ambulatory Visit (HOSPITAL_COMMUNITY): Payer: Medicare PPO

## 2022-02-18 ENCOUNTER — Ambulatory Visit: Payer: Medicare PPO | Admitting: Cardiology

## 2022-02-20 ENCOUNTER — Other Ambulatory Visit: Payer: Self-pay | Admitting: Gastroenterology

## 2022-02-20 ENCOUNTER — Ambulatory Visit (HOSPITAL_COMMUNITY): Payer: Medicare PPO

## 2022-02-20 DIAGNOSIS — D649 Anemia, unspecified: Secondary | ICD-10-CM

## 2022-02-25 ENCOUNTER — Observation Stay (HOSPITAL_COMMUNITY)
Admission: EM | Admit: 2022-02-25 | Discharge: 2022-02-26 | Disposition: A | Discharge status: Home / Self Care | Payer: Medicare PPO | Attending: Internal Medicine | Admitting: Internal Medicine

## 2022-02-25 ENCOUNTER — Other Ambulatory Visit: Payer: Self-pay

## 2022-02-25 ENCOUNTER — Emergency Department (HOSPITAL_COMMUNITY): Payer: Medicare PPO

## 2022-02-25 ENCOUNTER — Ambulatory Visit (HOSPITAL_COMMUNITY): Payer: Medicare PPO

## 2022-02-25 ENCOUNTER — Encounter (HOSPITAL_COMMUNITY): Payer: Self-pay

## 2022-02-25 DIAGNOSIS — Z87891 Personal history of nicotine dependence: Secondary | ICD-10-CM | POA: Insufficient documentation

## 2022-02-25 DIAGNOSIS — Z951 Presence of aortocoronary bypass graft: Secondary | ICD-10-CM | POA: Insufficient documentation

## 2022-02-25 DIAGNOSIS — Z79899 Other long term (current) drug therapy: Secondary | ICD-10-CM | POA: Diagnosis not present

## 2022-02-25 DIAGNOSIS — Z992 Dependence on renal dialysis: Secondary | ICD-10-CM | POA: Insufficient documentation

## 2022-02-25 DIAGNOSIS — J45909 Unspecified asthma, uncomplicated: Secondary | ICD-10-CM | POA: Diagnosis not present

## 2022-02-25 DIAGNOSIS — R109 Unspecified abdominal pain: Secondary | ICD-10-CM | POA: Insufficient documentation

## 2022-02-25 DIAGNOSIS — J449 Chronic obstructive pulmonary disease, unspecified: Secondary | ICD-10-CM | POA: Diagnosis not present

## 2022-02-25 DIAGNOSIS — Z96652 Presence of left artificial knee joint: Secondary | ICD-10-CM | POA: Diagnosis not present

## 2022-02-25 DIAGNOSIS — K529 Noninfective gastroenteritis and colitis, unspecified: Secondary | ICD-10-CM | POA: Insufficient documentation

## 2022-02-25 DIAGNOSIS — R1032 Left lower quadrant pain: Principal | ICD-10-CM | POA: Insufficient documentation

## 2022-02-25 DIAGNOSIS — Z7982 Long term (current) use of aspirin: Secondary | ICD-10-CM | POA: Diagnosis not present

## 2022-02-25 DIAGNOSIS — N186 End stage renal disease: Secondary | ICD-10-CM | POA: Diagnosis not present

## 2022-02-25 DIAGNOSIS — I2510 Atherosclerotic heart disease of native coronary artery without angina pectoris: Secondary | ICD-10-CM | POA: Insufficient documentation

## 2022-02-25 DIAGNOSIS — I5030 Unspecified diastolic (congestive) heart failure: Secondary | ICD-10-CM | POA: Diagnosis not present

## 2022-02-25 DIAGNOSIS — R112 Nausea with vomiting, unspecified: Secondary | ICD-10-CM | POA: Insufficient documentation

## 2022-02-25 DIAGNOSIS — I132 Hypertensive heart and chronic kidney disease with heart failure and with stage 5 chronic kidney disease, or end stage renal disease: Secondary | ICD-10-CM | POA: Diagnosis not present

## 2022-02-25 DIAGNOSIS — R1013 Epigastric pain: Secondary | ICD-10-CM

## 2022-02-25 LAB — COMPREHENSIVE METABOLIC PANEL
ALT: 9 U/L (ref 0–44)
AST: 14 U/L — ABNORMAL LOW (ref 15–41)
Albumin: 3 g/dL — ABNORMAL LOW (ref 3.5–5.0)
Alkaline Phosphatase: 118 U/L (ref 38–126)
Anion gap: 8 (ref 5–15)
BUN: 14 mg/dL (ref 8–23)
CO2: 26 mmol/L (ref 22–32)
Calcium: 8.3 mg/dL — ABNORMAL LOW (ref 8.9–10.3)
Chloride: 101 mmol/L (ref 98–111)
Creatinine, Ser: 4.77 mg/dL — ABNORMAL HIGH (ref 0.44–1.00)
GFR, Estimated: 10 mL/min — ABNORMAL LOW (ref >60)
Glucose, Bld: 95 mg/dL (ref 70–99)
Potassium: 3.9 mmol/L (ref 3.5–5.1)
Sodium: 135 mmol/L (ref 135–145)
Total Bilirubin: 0.6 mg/dL (ref 0.3–1.2)
Total Protein: 6.3 g/dL — ABNORMAL LOW (ref 6.5–8.1)

## 2022-02-25 LAB — CBC WITH DIFFERENTIAL/PLATELET
Abs Immature Granulocytes: 0.03 10*3/uL (ref 0.00–0.07)
Basophils Absolute: 0 10*3/uL (ref 0.0–0.1)
Basophils Relative: 1 %
Eosinophils Absolute: 0.5 10*3/uL (ref 0.0–0.5)
Eosinophils Relative: 11 %
HCT: 36.9 % (ref 36.0–46.0)
Hemoglobin: 11 g/dL — ABNORMAL LOW (ref 12.0–15.0)
Immature Granulocytes: 1 %
Lymphocytes Relative: 19 %
Lymphs Abs: 0.9 10*3/uL (ref 0.7–4.0)
MCH: 31.1 pg (ref 26.0–34.0)
MCHC: 29.8 g/dL — ABNORMAL LOW (ref 30.0–36.0)
MCV: 104.2 fL — ABNORMAL HIGH (ref 80.0–100.0)
Monocytes Absolute: 0.4 10*3/uL (ref 0.1–1.0)
Monocytes Relative: 9 %
Neutro Abs: 2.7 10*3/uL (ref 1.7–7.7)
Neutrophils Relative %: 59 %
Platelets: 211 10*3/uL (ref 150–400)
RBC: 3.54 MIL/uL — ABNORMAL LOW (ref 3.87–5.11)
RDW: 15.8 % — ABNORMAL HIGH (ref 11.5–15.5)
WBC: 4.5 10*3/uL (ref 4.0–10.5)
nRBC: 0 % (ref 0.0–0.2)

## 2022-02-25 LAB — CBG MONITORING, ED: Glucose-Capillary: 129 mg/dL — ABNORMAL HIGH (ref 70–99)

## 2022-02-25 LAB — PROTIME-INR
INR: 1.2 (ref 0.8–1.2)
Prothrombin Time: 15.2 seconds (ref 11.4–15.2)

## 2022-02-25 LAB — TYPE AND SCREEN
ABO/RH(D): AB POS
Antibody Screen: NEGATIVE

## 2022-02-25 LAB — LIPASE, BLOOD: Lipase: 62 U/L — ABNORMAL HIGH (ref 11–51)

## 2022-02-25 MED ORDER — UMECLIDINIUM BROMIDE 62.5 MCG/ACT IN AEPB
1.0000 | INHALATION_SPRAY | Freq: Every day | RESPIRATORY_TRACT | Status: DC
  Administered 2022-02-26: 1 via RESPIRATORY_TRACT
  Filled 2022-02-25: qty 7

## 2022-02-25 MED ORDER — MORPHINE SULFATE (PF) 2 MG/ML IV SOLN
2.0000 mg | Freq: Once | INTRAVENOUS | Status: AC
  Administered 2022-02-25: 2 mg via INTRAVENOUS
  Filled 2022-02-25: qty 1

## 2022-02-25 MED ORDER — PANTOPRAZOLE SODIUM 40 MG PO TBEC
40.0000 mg | DELAYED_RELEASE_TABLET | Freq: Every day | ORAL | Status: DC
  Administered 2022-02-25 – 2022-02-26 (×2): 40 mg via ORAL
  Filled 2022-02-25 (×2): qty 1

## 2022-02-25 MED ORDER — HYDRALAZINE HCL 20 MG/ML IJ SOLN
20.0000 mg | Freq: Once | INTRAMUSCULAR | Status: AC
  Administered 2022-02-25: 20 mg via INTRAVENOUS
  Filled 2022-02-25: qty 1

## 2022-02-25 MED ORDER — LABETALOL HCL 200 MG PO TABS
400.0000 mg | ORAL_TABLET | Freq: Three times a day (TID) | ORAL | Status: DC
  Filled 2022-02-25: qty 2

## 2022-02-25 MED ORDER — HYDRALAZINE HCL 50 MG PO TABS: 100.0000 mg | ORAL_TABLET | Freq: Three times a day (TID) | ORAL | Status: DC

## 2022-02-25 MED ORDER — LABETALOL HCL 5 MG/ML IV SOLN
5.0000 mg | INTRAVENOUS | Status: DC | PRN
  Administered 2022-02-25: 5 mg via INTRAVENOUS
  Filled 2022-02-25 (×2): qty 4

## 2022-02-25 MED ORDER — ONDANSETRON 4 MG PO TBDP
4.0000 mg | ORAL_TABLET | Freq: Once | ORAL | Status: AC
  Administered 2022-02-25: 4 mg via ORAL
  Filled 2022-02-25: qty 1

## 2022-02-25 MED ORDER — METOCLOPRAMIDE HCL 5 MG/ML IJ SOLN
5.0000 mg | Freq: Once | INTRAMUSCULAR | Status: AC
  Administered 2022-02-25: 5 mg via INTRAVENOUS
  Filled 2022-02-25: qty 2

## 2022-02-25 MED ORDER — METOCLOPRAMIDE HCL 5 MG/ML IJ SOLN
5.0000 mg | Freq: Three times a day (TID) | INTRAMUSCULAR | Status: DC | PRN
  Filled 2022-02-25: qty 2

## 2022-02-25 MED ORDER — ISOSORBIDE MONONITRATE ER 60 MG PO TB24
60.0000 mg | ORAL_TABLET | Freq: Every day | ORAL | Status: DC
  Administered 2022-02-25 – 2022-02-26 (×2): 60 mg via ORAL
  Filled 2022-02-25: qty 1
  Filled 2022-02-25: qty 2

## 2022-02-25 MED ORDER — SODIUM CHLORIDE 0.9 % IV BOLUS
500.0000 mL | Freq: Once | INTRAVENOUS | Status: AC
  Administered 2022-02-25: 500 mL via INTRAVENOUS

## 2022-02-25 MED ORDER — IRBESARTAN 300 MG PO TABS: 300.0000 mg | ORAL_TABLET | Freq: Every day | ORAL | Status: DC

## 2022-02-25 MED ORDER — CLONIDINE HCL 0.2 MG PO TABS
0.3000 mg | ORAL_TABLET | Freq: Three times a day (TID) | ORAL | Status: DC
  Filled 2022-02-25: qty 1

## 2022-02-25 MED ORDER — HEPARIN SODIUM (PORCINE) 5000 UNIT/ML IJ SOLN
5000.0000 [IU] | Freq: Three times a day (TID) | INTRAMUSCULAR | Status: DC
  Administered 2022-02-25 – 2022-02-26 (×3): 5000 [IU] via SUBCUTANEOUS
  Filled 2022-02-25 (×3): qty 1

## 2022-02-25 MED ORDER — MONTELUKAST SODIUM 10 MG PO TABS
10.0000 mg | ORAL_TABLET | Freq: Every day | ORAL | Status: DC
  Administered 2022-02-26: 10 mg via ORAL
  Filled 2022-02-25: qty 1

## 2022-02-25 MED ORDER — IOHEXOL 350 MG/ML SOLN
75.0000 mL | Freq: Once | INTRAVENOUS | Status: AC | PRN
  Administered 2022-02-25: 75 mL via INTRAVENOUS

## 2022-02-25 MED ORDER — NIFEDIPINE ER OSMOTIC RELEASE 60 MG PO TB24
60.0000 mg | ORAL_TABLET | Freq: Two times a day (BID) | ORAL | Status: DC
  Filled 2022-02-25: qty 1

## 2022-02-25 MED ORDER — INSULIN ASPART 100 UNIT/ML IJ SOLN: 0.0000 [IU] | Freq: Three times a day (TID) | INTRAMUSCULAR | Status: DC

## 2022-02-25 MED ORDER — ZOLPIDEM TARTRATE 5 MG PO TABS
5.0000 mg | ORAL_TABLET | Freq: Every evening | ORAL | Status: DC | PRN
  Administered 2022-02-26: 5 mg via ORAL
  Filled 2022-02-25: qty 1

## 2022-02-25 MED ORDER — PREDNISONE 10 MG PO TABS
5.0000 mg | ORAL_TABLET | Freq: Every day | ORAL | Status: DC
  Administered 2022-02-26: 5 mg via ORAL
  Filled 2022-02-25: qty 1

## 2022-02-25 MED ORDER — ARFORMOTEROL TARTRATE 15 MCG/2ML IN NEBU
15.0000 ug | INHALATION_SOLUTION | Freq: Two times a day (BID) | RESPIRATORY_TRACT | Status: DC
  Administered 2022-02-25: 15 ug via RESPIRATORY_TRACT
  Filled 2022-02-25 (×2): qty 2

## 2022-02-25 NOTE — ED Notes (Signed)
ED TO INPATIENT HANDOFF REPORT  ED Nurse Name and Phone #: Andi Hence, RN  S Name/Age/Gender Jodi Bell 62 y.o. female Room/Bed: 041C/041C  Code Status   Code Status: Full Code  Home/SNF/Other Home Patient oriented to: self, place, time, and situation Is this baseline? Yes   Triage Complete: Triage complete  Chief Complaint Gastroenteritis [K52.9]  Triage Note Pt BIB [facility] from home d/t abd pain with n/v/d for approx.  2 weeks. She is a dialysis pt on MWF & has NOT missed any Tx. Lately her HGB has been low, was given iron yesterday. Does have Hx of GL bleeds pt reports abd pain feeling now does feel similar. EMS reports her coffee ground emesis started today & they did witness one emesis event on scene & pt reports black tarry stools started a few days ago. Her abd pain starts in her ULQ then radiates down her abd. Hx of HTN as well & EMS reports 2 pressures of 230/108 then 200/100. Resp 18, 70 bpm, 97% on RA, wears home O2 PRN, on RA now, CBG 101. Pain 5/10 then 10/10 when the pain intensifies feeling like labor pains. A/Ox4.    Allergies Allergies  Allergen Reactions   3-Methyl-2-Benzothiazolinone Hydrazone Other (See Comments)    Muscle weakness muscle cramping in legs Other reaction(s): Myalgias (Muscle Pain)   Banana Anaphylaxis, Swelling and Other (See Comments)    TONGUE AND MOUTH SWELLING   Black Walnut Flavor Anaphylaxis and Itching    Walnuts   Hazelnut ([place]) Allergy Skin Test Anaphylaxis    Hazelnuts   Leflunomide And Related Other (See Comments)    Severe Headache   Lisinopril Swelling    Angioedema  Swelling of the face   No [employer] Food Allergies Anaphylaxis    Spicy Mustard 4.7.2021 Pt reports that she eats Regular Yellow Mustard Swelling and itching of tongue   Other Other (See Comments), Anaphylaxis and Swelling    Cosentyx= angioedema  Other reaction(s): Facial Edema (intolerance) Mouth itches and tongue swelling Hazel nuts and  pecans also Walnuts Walnuts Renal failure Other reaction(s): Other Serotonin Syndrome  Serotonin syndrome  Tremors Other reaction(s): Other Tremors Muscle weakness muscle cramping in legs Other reaction(s): Myalgias (Muscle Pain) Mouth itches and tongue swelling Hazel nuts and pecans also Walnuts    Pecan Extract Allergy Skin Test Anaphylaxis and Itching    Pecans    Pecan Nut (Diagnostic) Anaphylaxis and Itching    Pecans   Trazodone And Nefazodone Other (See Comments)    Serotonin Syndrome  Tremors   Adalimumab Other (See Comments)    Blisters on abdomen =humira   Escitalopram Oxalate Other (See Comments)    Hand problems - Serotonin Syndrome     Ezetimibe Other (See Comments)    myalgias cramps    Secukinumab Swelling    Cosentyx = angiodema    Statins Other (See Comments)    Leg pains and weakness   Ferrlecit [Na Ferric Gluc Cplx In Sucrose]     Not reaction given from HD   Gabapentin Other (See Comments)    tremors   Ibuprofen Other (See Comments)    Renal Problems   Duloxetine Rash    Level of Care/Admitting Diagnosis ED Disposition     ED Disposition  Admit   Condition  --   Nitro: [place] [[id number]]  Level of Care: Med-Surg [16]  May place patient in observation at [facility] or [facility] if equivalent level of care  is available:: No  Covid Evaluation: Asymptomatic - no recent exposure (last 10 days) testing not required  Diagnosis: Gastroenteritis [[id number]]  Admitting Physician: Aldine Contes [[id number]]  Attending Physician: Aldine Contes [phone]          B Medical/Surgery History Past Medical History:  Diagnosis Date   Anemia of chronic disease    Anxiety    Arthritis    oa and psoriatic ra   Asthma    Back pain, chronic    lower back   Candida infection finished tx 2 days ago   Chest pain 07/16/2021   Chronic insomnia    COPD (chronic obstructive pulmonary disease) ([place])     Coronary artery calcification seen on CT scan    Diabetes mellitus    type 2   Eczema    ESRD on dialysis [facility])    Essential hypertension    GERD (gastroesophageal reflux disease)    Gout    History of hiatal hernia    Hypoalbuminemia    Hyponatremia    Melena 09/20/2017   Mild aortic stenosis    Mitral regurgitation    Mitral stenosis    Obesity    PAD (peripheral artery disease) ([place])    a. externial iliac calcification seen on CT 04/2020.   Pneumonia few yrs ago x 2   Sleep apnea    Tobacco abuse    Upper GI bleed 05/2019   Past Surgical History:  Procedure Laterality Date   BACK SURGERY  2016   lower back fusion with cage   BIOPSY  09/23/2017   Procedure: BIOPSY;  Surgeon: Wonda Horner, MD;  Location: [facility];  Service: Endoscopy;;   BIOPSY  05/19/2019   Procedure: BIOPSY;  Surgeon: Otis Brace, MD;  Location: [place];  Service: Gastroenterology;;   CESAREAN SECTION  1990   x 1    COLONOSCOPY WITH PROPOFOL N/A 09/23/2017   Procedure: COLONOSCOPY WITH PROPOFOL Hemostatic clips placed;  Surgeon: Wonda Horner, MD;  [facility];  Service: Endoscopy;  Laterality: N/A;   COLONOSCOPY WITH PROPOFOL N/A 10/02/2020   Procedure: COLONOSCOPY WITH PROPOFOL;  Surgeon: Arta Silence, MD;  Location: [place];  Service: Endoscopy;  Laterality: N/A;   CORONARY ARTERY BYPASS GRAFT N/A 09/13/2020   Procedure: CORONARY ARTERY BYPASS GRAFTING (CABG), ON PUMP, TIMES TWO, USING LEFT INTERNAL MAMMARY ARTERY AND RIGHT ENDOSCOPICALLY HARVESTED GREATER SAPHENOUS VEIN;  Surgeon: Gaye Pollack, MD;  Location: [place];  Service: Open Heart Surgery;  Laterality: N/A;   DILATION AND CURETTAGE OF UTERUS  1988   ENTEROSCOPY N/A 07/22/2021   Procedure: ENTEROSCOPY;  Surgeon: Ronnette Juniper, MD;  Location: [place];  Service: Gastroenterology;  Laterality: N/A;   ESOPHAGOGASTRODUODENOSCOPY N/A 09/22/2020   Procedure: ESOPHAGOGASTRODUODENOSCOPY (EGD);  Surgeon: Arta Silence,  MD;  Location[facility] END[facility];  Service: Endoscopy;  Laterality: N/A;   ESOPHAGOGASTRODUODENOSCOPY (EGD) WITH PROPOFOL N/A 09/23/2017   Procedure: ESOPHAGOGASTRODUODENOSCOPY (EGD) WITH PROPOFOL;  Surgeon: Wonda Horner, MD;  [facility];  Service: Endoscopy;  Laterality: N/A;   ESOPHAGOGASTRODUODENOSCOPY (EGD) WITH PROPOFOL N/A 05/19/2019   Procedure: ESOPHAGOGASTRODUODENOSCOPY (EGD) WITH PROPOFOL;  Surgeon: Otis Brace, MD;  Location: [place];  Service: Gastroenterology;  Laterality: N/A;   ESOPHAGOGASTRODUODENOSCOPY (EGD) WITH PROPOFOL N/A 10/02/2020   Procedure: ESOPHAGOGASTRODUODENOSCOPY (EGD) WITH PROPOFOL;  Surgeon: Arta Silence, MD;  Location: [place];  Service: Endoscopy;  Laterality: N/A;   GIVENS CAPSULE STUDY N/A 05/19/2019   Procedure: GIVENS CAPSULE STUDY;  Surgeon: Otis Brace, MD;  Location: [place];  Service: Gastroenterology;  Laterality: N/A;   GIVENS CAPSULE STUDY N/A 07/19/2021   Procedure: GIVENS CAPSULE STUDY;  Surgeon: Clarene Essex, MD;  Location: [place];  Service: Gastroenterology;  Laterality: N/A;   HEMOSTASIS CLIP PLACEMENT  09/22/2020   Procedure: HEMOSTASIS CLIP PLACEMENT;  Surgeon: Arta Silence, MD;  Location: [place];  Service: Endoscopy;;   HEMOSTASIS CONTROL  09/22/2020   Procedure: HEMOSTASIS CONTROL;  Surgeon: Arta Silence, MD;  Location: [place];  Service: Endoscopy;;   HOT HEMOSTASIS N/A 05/19/2019   Procedure: HOT HEMOSTASIS (ARGON PLASMA COAGULATION/BICAP);  Surgeon: Otis Brace, MD;  Location: [facility] ENDOSCOPY;  Service: Gastroenterology;  Laterality: N/A;   LEFT HEART CATH AND CORONARY ANGIOGRAPHY N/A 09/10/2020   Procedure: LEFT HEART CATH AND CORONARY ANGIOGRAPHY;  Surgeon: Nelva Bush, MD;  Location: [place] [facility];  Service: Cardiovascular;  Laterality: N/A;   PARTIAL KNEE ARTHROPLASTY Left 11/12/2017   Procedure: left unicompartmental arthroplasty-medial;  Surgeon: Paralee Cancel, MD;  Location:  [facility];  Service: Orthopedics;  Laterality: Left;  83min   SUBMUCOSAL INJECTION  09/23/2017   Procedure: SUBMUCOSAL INJECTION;  Surgeon: Wonda Horner, MD;  [facility];  Service: Endoscopy;;  in colon   TEE WITHOUT CARDIOVERSION N/A 09/13/2020   Procedure: TRANSESOPHAGEAL ECHOCARDIOGRAM (TEE);  Surgeon: Gaye Pollack, MD;  Location: [place];  Service: Open Heart Surgery;  Laterality: N/A;   TUBAL LIGATION  1990     A IV Location/Drains/Wounds Patient Lines/Drains/Airways Status     Active Line/Drains/Airways     Name Placement date Placement time Site Days   Peripheral IV 02/25/22 20 G Right Antecubital 02/25/22  1525  Antecubital  less than 1   Fistula / Graft Left Upper arm 05/17/18  0800  Upper arm  1380            Intake/Output Last 24 hours No intake or output data in the 24 hours ending 02/25/22 2312  Labs/Imaging Results for orders placed or performed during the hospital encounter of 02/25/22 (from the past 48 hour(s))  Comprehensive metabolic panel     Status: Abnormal   Collection Time: 02/25/22 12:52 PM  Result Value Ref Range   Sodium 135 135 - 145 mmol/L   Potassium 3.9 3.5 - 5.1 mmol/L   Chloride 101 98 - 111 mmol/L   CO2 26 22 - 32 mmol/L   Glucose, Bld 95 70 - 99 mg/dL    Comment: Glucose reference range applies only to samples taken after fasting for at least 8 hours.   BUN 14 8 - 23 mg/dL   Creatinine, Ser 4.77 (H) 0.44 - 1.00 mg/dL   Calcium 8.3 (L) 8.9 - 10.3 mg/dL   Total Protein 6.3 (L) 6.5 - 8.1 g/dL   Albumin 3.0 (L) 3.5 - 5.0 g/dL   AST 14 (L) 15 - 41 U/L   ALT 9 0 - 44 U/L   Alkaline Phosphatase 118 38 - 126 U/L   Total Bilirubin 0.6 0.3 - 1.2 mg/dL   GFR, Estimated 10 (L) >60 mL/min    Comment: (NOTE) Calculated using the CKD-EPI Creatinine Equation (2021)    Anion gap 8 5 - 15    Comment: Performed at [facility], [place] [address], [place], [place] [address]  CBC with Differential     Status: Abnormal   Collection Time:  02/25/22 12:52 PM  Result Value Ref Range   WBC 4.5 4.0 - 10.5 K/uL   RBC 3.54 (L) 3.87 - 5.11 MIL/uL   Hemoglobin  11.0 (L) 12.0 - 15.0 g/dL   HCT 36.9 36.0 - 46.0 %   MCV 104.2 (H) 80.0 - 100.0 fL   MCH 31.1 26.0 - 34.0 pg   MCHC 29.8 (L) 30.0 - 36.0 g/dL   RDW 15.8 (H) 11.5 - 15.5 %   Platelets 211 150 - 400 K/uL   nRBC 0.0 0.0 - 0.2 %   Neutrophils Relative % 59 %   Neutro Abs 2.7 1.7 - 7.7 K/uL   Lymphocytes Relative 19 %   Lymphs Abs 0.9 0.7 - 4.0 K/uL   Monocytes Relative 9 %   Monocytes Absolute 0.4 0.1 - 1.0 K/uL   Eosinophils Relative 11 %   Eosinophils Absolute 0.5 0.0 - 0.5 K/uL   Basophils Relative 1 %   Basophils Absolute 0.0 0.0 - 0.1 K/uL   Immature Granulocytes 1 %   Abs Immature Granulocytes 0.03 0.00 - 0.07 K/uL    Comment: Performed at [facility] [address] [place], [place] [address]  Protime-INR     Status: None   Collection Time: 02/25/22 12:52 PM  Result Value Ref Range   Prothrombin Time 15.2 11.4 - 15.2 seconds   INR 1.2 0.8 - 1.2    Comment: (NOTE) INR goal varies based on device and disease states. Performed at [facility], [place] [address] [place], [place] [address]   Lipase, blood     Status: Abnormal   Collection Time: 02/25/22 12:52 PM  Result Value Ref Range   Lipase 62 (H) 11 - 51 U/L    Comment: Performed at [facility] [address] [place], [place] [address]  Type and screen [place]     Status: None   Collection Time: 02/25/22  1:00 PM  Result Value Ref Range   ABO/RH(D) AB POS    Antibody Screen NEG    Sample Expiration      02/28/2022,2359 Performed at [facility], [place] [address] [place], [place] [address]   CBG monitoring, ED     Status: Abnormal   Collection Time: 02/25/22 11:05 PM  Result Value Ref Range   Glucose-Capillary 129 (H) 70 - 99 mg/dL    Comment: Glucose reference range applies only to samples taken after fasting for at least 8 hours.   CT ABDOMEN PELVIS  W CONTRAST  Result Date: 02/25/2022 CLINICAL DATA:  Left lower quadrant abdominal pain. EXAM: CT ABDOMEN AND PELVIS WITH CONTRAST TECHNIQUE: Multidetector CT imaging of the abdomen and pelvis was performed using the standard protocol following bolus administration of intravenous contrast. RADIATION DOSE REDUCTION: This exam was performed according to the departmental dose-optimization program which includes automated exposure control, adjustment of the mA and/or kV according to patient size and/or use of iterative reconstruction technique. CONTRAST:  74mL OMNIPAQUE IOHEXOL 350 MG/ML SOLN COMPARISON:  CT scan 09/09/2020 FINDINGS: Lower chest: Very small bilateral pleural effusions with overlying bibasilar atelectasis. Stable cardiac enlargement but no pericardial effusion. Stable aortic and coronary artery calcifications, advanced for age. Hepatobiliary: No hepatic lesions or intrahepatic biliary dilatation. The gallbladder demonstrates moderate wall thickening possibly related to the underlying abdominal fluid and possible low albumin. This was also present on the prior CT scan and is unlikely acute cholecystitis. No common bile duct dilatation. Pancreas: No mass, inflammation or ductal dilatation. Spleen: Normal size.  No worrisome lesions. Adrenals/Urinary Tract: Adrenal glands are. Numerous bilateral renal cysts are stable and do not need any further imaging evaluation or follow-up.  The bladder is grossly normal. It is largely decompressed. Stomach/Bowel: The stomach contains 2 large vascular clips likely related to prior GI bleed. No acute inflammatory process. The duodenum, small bowel and colon are grossly normal. No obstructive findings. Diffuse colonic diverticulosis without findings for acute diverticulitis. Vascular/Lymphatic: Advanced atherosclerotic calcifications involving the aorta and branch vessels. The major venous structures are patent. No mesenteric or retroperitoneal mass or adenopathy.  Reproductive: The uterus and ovaries are grossly normal. Other: Small volume abdominal ascites along with mesenteric edema and diffuse body wall edema. Small to moderate amount of free pelvic fluid. Musculoskeletal: Chronic severe compression fracture of L3 with fusion hardware at L3-4 and severe degenerative changes and possibly post infectious changes at L2-3. Stable bone lesions at T12 and L4. IMPRESSION: 1. Small volume abdominal ascites along with mesenteric edema and diffuse body wall edema. 2. Diffuse colonic diverticulosis without findings for acute diverticulitis. 3. Very small bilateral pleural effusions with overlying bibasilar atelectasis. 4. Stable cardiac enlargement. 5. Advanced atherosclerotic calcifications involving the aorta and branch vessels. 6. Stable large vascular clips in the stomach likely related to prior GI bleed. 7. Severe chronic changes involving the lumbar spine. Aortic Atherosclerosis (ICD10-I70.0). Electronically Signed   By: Marijo Sanes M.D.   On: 02/25/2022 16:47    Pending Labs Unresulted Labs (From admission, onward)     Start     Ordered   02/26/22 0500  CBC  Tomorrow morning,   R        02/25/22 2059   02/26/22 0500  Renal function panel  Tomorrow morning,   R        02/25/22 2236   02/25/22 2231  Gastrointestinal Panel by PCR , Stool  (Gastrointestinal Panel by PCR, Stool                                                                                                                                                     **Does Not include CLOSTRIDIUM DIFFICILE testing. **If CDIFF testing is needed, place order from the "C Difficile Testing" order set.**)  Once,   R        02/25/22 2230            Vitals/Pain Today's Vitals   02/25/22 2200 02/25/22 2231 02/25/22 2245 02/25/22 2300  BP: (!) 210/82  (!) 191/74 (!) 202/78  Pulse: 71  72 75  Resp: 16  17 (!) 21  Temp:  98.4 F (36.9 C)    TempSrc:      SpO2: 98%  98% 99%  PainSc:        Isolation  Precautions Enteric precautions (UV disinfection)  Medications Medications  heparin injection 5,000 Units (has no administration in time range)  cloNIDine (CATAPRES) tablet 0.3 mg (has no administration in time range)  hydrALAZINE (APRESOLINE) tablet 100 mg (has no administration in  time range)  isosorbide mononitrate (IMDUR) 24 hr tablet 60 mg (has no administration in time range)  labetalol (NORMODYNE) tablet 400 mg (has no administration in time range)  montelukast (SINGULAIR) tablet 10 mg (has no administration in time range)  NIFEdipine (PROCARDIA XL/NIFEDICAL XL) 24 hr tablet 60 mg (has no administration in time range)  irbesartan (AVAPRO) tablet 300 mg (has no administration in time range)  pantoprazole (PROTONIX) EC tablet 40 mg (has no administration in time range)  arformoterol (BROVANA) nebulizer solution 15 mcg (has no administration in time range)    And  umeclidinium bromide (INCRUSE ELLIPTA) 62.5 MCG/ACT 1 puff (has no administration in time range)  insulin aspart (novoLOG) injection 0-6 Units (has no administration in time range)  predniSONE (DELTASONE) tablet 5 mg (has no administration in time range)  zolpidem (AMBIEN) tablet 5 mg (has no administration in time range)  metoCLOPramide (REGLAN) injection 5 mg (has no administration in time range)  ondansetron (ZOFRAN-ODT) disintegrating tablet 4 mg (4 mg Oral Given 02/25/22 1308)  sodium chloride 0.9 % bolus 500 mL (0 mLs Intravenous Stopped 02/25/22 1759)  hydrALAZINE (APRESOLINE) injection 20 mg (20 mg Intravenous Given 02/25/22 1535)  iohexol (OMNIPAQUE) 350 MG/ML injection 75 mL (75 mLs Intravenous Contrast Given 02/25/22 1630)  metoCLOPramide (REGLAN) injection 5 mg (5 mg Intravenous Given 02/25/22 1651)  morphine (PF) 2 MG/ML injection 2 mg (2 mg Intravenous Given 02/25/22 2016)    Mobility walks with device     Focused Assessments Neuro Assessment Handoff:  Swallow screen pass? Yes          Neuro Assessment:    Neuro Checks:      Has TPA been given? No If patient is a Neuro Trauma and patient is going to OR before floor call report to [facility] nurse: [phone] or [phone]  , Renal Assessment Handoff:  Hemodialysis Schedule: Hemodialysis Schedule: Monday/Wednesday/Friday Last Hemodialysis date and time: 02/24/2022   Restricted appendage: left arm  , Pulmonary Assessment Handoff:  Lung sounds:   O2 Device: Nasal Cannula O2 Flow Rate (L/min): 2 L/min    R Recommendations: See Admitting Provider Note  Report given to:   Additional Notes:

## 2022-02-25 NOTE — H&P (Signed)
Date: 02/25/2022               Patient Name:  Jodi Bell MRN: [id number]  DOB: 09-27-1960 Age / Sex: 62 y.o., female   PCP: [name]., MD         Medical Service: Internal Medicine Teaching Service         Attending Physician: Dr. Aldine Contes, MD      First Contact: Dr. Nani Gasser, MD Pager [phone]    Second Contact: Dr. Buddy Duty, DO Pager [phone]         After Hours (After 5p/  First Contact Pager: [phone]  weekends / holidays): Second Contact Pager: [phone]   SUBJECTIVE   Chief Complaint: Abdominal pain  History of Present Illness: This is a 62 year old female with a past medical history of ESRD on dialysis Monday, Wednesday, Friday, COPD on 2-3L O2 at night, asthma, CAD status post CABG, hypertension, OSA, GERD, anemia who presented to the emergency room today with concerns of abdominal pain for the past day.  Patient states that she developed some nausea and vomiting last week, that improved.  She states yesterday she had dialysis, and it went well.  She also had a good appetite yesterday.  She states she woke up this morning, had a bowl of cereal and drink some coffee along with her medicine, and developed this abdominal pain.  She describes it as labor pains, that initially started in her left upper quadrant, went down to her left lower and right lower quadrant, and has been there since.  She states that it comes and goes, and when it comes on, she vomits, and the pain gets better.  She also reports having 4 loose stools today.  She describes them to be dark in nature, but no streaks of blood noted.  She states that the dark stool is likely secondary to iron that she got yesterday during dialysis.  She states she has been having trouble with keeping down pills and solid foods.  She reports that this pain did not get any better, and decided to come to the emergency room.  Of note, she denies any recent sick contacts, new foods, raw foods, or uncooked foods.   She denies any fevers or chills.  She denies any chest pain or shortness of breath.  She does states that she has this right leg pain, but that is not new.  She does note that she has a history of nausea, and states that on days of HD, she does not have time to eat before and then feels nauseated.  ED Course: Patient initially presented to the emergency room with vital signs of temperature 97.8 F, pulse 71, blood pressure 241/84.  Initial labs showed creatinine of 4.77, albumin 3, hemoglobin 11, lipase 62, no white count noted.  Initial CT imaging showed small abdominal ascites with mesenteric edema and diffuse body wall edema, small bilateral pleural effusions, and colonic diverticulosis without evidence of diverticulitis.  Patient was given hydralazine as well as 500 cc normal saline bolus in ED.  Patient was given antiemetics as well as pain medication with morphine in the ED, and patient did not pass p.o. challenge. IMTS was consulted for further evaluation management.  Meds:  Clonidine-0.3 mg 3 times daily Hydralazine-100 mg every 8 hours Olmesartan-40 mg daily Labetalol-400 mg 3 times daily Imdur-60 mg daily Procardia-60 mg BID [id number]-54 mg daily Folic YHCW-237 mcg daily Gabapentin-300 mg at bedtime Ferrous sulfate-325 mg 3 times per week on  Tuesday, Thursday, Saturday Tylenol-1000 mg daily as needed for pain Allopurinol-100 mg daily Stiolto-2 puffs daily Prednisone-5 mg daily Ativan-1 mg every 8 hours as needed for anxiety Singulair-10 mg daily Ventolin-2 puffs every 4-6 hours as needed for wheezing Oxycodone-10 mg daily as needed for pain Zolpidem- 5 mg QHS   Past Medical History  Past Surgical History:  Procedure Laterality Date   BACK SURGERY  2016   lower back fusion with cage   BIOPSY  09/23/2017   Procedure: BIOPSY;  Surgeon: Wonda Horner, MD;  Location: [facility];  Service: Endoscopy;;   BIOPSY  05/19/2019   Procedure: BIOPSY;  Surgeon: Otis Brace, MD;   Location: [place];  Service: Gastroenterology;;   CESAREAN SECTION  1990   x 1    COLONOSCOPY WITH PROPOFOL N/A 09/23/2017   Procedure: COLONOSCOPY WITH PROPOFOL Hemostatic clips placed;  Surgeon: Wonda Horner, MD;  Location: [facility];  Service: Endoscopy;  Laterality: N/A;   COLONOSCOPY WITH PROPOFOL N/A 10/02/2020   Procedure: COLONOSCOPY WITH PROPOFOL;  Surgeon: Arta Silence, MD;  Location: [place];  Service: Endoscopy;  Laterality: N/A;   CORONARY ARTERY BYPASS GRAFT N/A 09/13/2020   Procedure: CORONARY ARTERY BYPASS GRAFTING (CABG), ON PUMP, TIMES TWO, USING LEFT INTERNAL MAMMARY ARTERY AND RIGHT ENDOSCOPICALLY HARVESTED GREATER SAPHENOUS VEIN;  Surgeon: Gaye Pollack, MD;  Location: [place];  Service: Open Heart Surgery;  Laterality: N/A;   DILATION AND CURETTAGE OF UTERUS  1988   ENTEROSCOPY N/A 07/22/2021   Procedure: ENTEROSCOPY;  Surgeon: Ronnette Juniper, MD;  Location: [place];  Service: Gastroenterology;  Laterality: N/A;   ESOPHAGOGASTRODUODENOSCOPY N/A 09/22/2020   Procedure: ESOPHAGOGASTRODUODENOSCOPY (EGD);  Surgeon: Arta Silence, MD;  Location[facility];  Service: Endoscopy;  Laterality: N/A;   ESOPHAGOGASTRODUODENOSCOPY (EGD) WITH PROPOFOL N/A 09/23/2017   Procedure: ESOPHAGOGASTRODUODENOSCOPY (EGD) WITH PROPOFOL;  Surgeon: Wonda Horner, MD;  Location: [facility];  Service: Endoscopy;  Laterality: N/A;   ESOPHAGOGASTRODUODENOSCOPY (EGD) WITH PROPOFOL N/A 05/19/2019   Procedure: ESOPHAGOGASTRODUODENOSCOPY (EGD) WITH PROPOFOL;  Surgeon: Otis Brace, MD;  Location: [place];  Service: Gastroenterology;  Laterality: N/A;   ESOPHAGOGASTRODUODENOSCOPY (EGD) WITH PROPOFOL N/A 10/02/2020   Procedure: ESOPHAGOGASTRODUODENOSCOPY (EGD) WITH PROPOFOL;  Surgeon: Arta Silence, MD;  Location: [place];  Service: Endoscopy;  Laterality: N/A;   GIVENS CAPSULE STUDY N/A 05/19/2019   Procedure: GIVENS CAPSULE STUDY;  Surgeon: Otis Brace, MD;  Location:  [place];  Service: Gastroenterology;  Laterality: N/A;   GIVENS CAPSULE STUDY N/A 07/19/2021   Procedure: GIVENS CAPSULE STUDY;  Surgeon: Clarene Essex, MD;  Location: [place];  Service: Gastroenterology;  Laterality: N/A;   HEMOSTASIS CLIP PLACEMENT  09/22/2020   Procedure: HEMOSTASIS CLIP PLACEMENT;  Surgeon: Arta Silence, MD;  Location: [place];  Service: Endoscopy;;   HEMOSTASIS CONTROL  09/22/2020   Procedure: HEMOSTASIS CONTROL;  Surgeon: Arta Silence, MD;  Location: [place];  Service: Endoscopy;;   HOT HEMOSTASIS N/A 05/19/2019   Procedure: HOT HEMOSTASIS (ARGON PLASMA COAGULATION/BICAP);  Surgeon: Otis Brace, MD;  Location: [facility];  Service: Gastroenterology;  Laterality: N/A;   LEFT HEART CATH AND CORONARY ANGIOGRAPHY N/A 09/10/2020   Procedure: LEFT HEART CATH AND CORONARY ANGIOGRAPHY;  Surgeon: Nelva Bush, MD;  Location: [place] [facility];  Service: Cardiovascular;  Laterality: N/A;   PARTIAL KNEE ARTHROPLASTY Left 11/12/2017   Procedure: left unicompartmental arthroplasty-medial;  Surgeon: Paralee Cancel, MD;  Location: [facility];  Service: Orthopedics;  Laterality: Left;  64min   SUBMUCOSAL INJECTION  09/23/2017   Procedure:  SUBMUCOSAL INJECTION;  Surgeon: Wonda Horner, MD;  Location: [facility];  Service: Endoscopy;;  in colon   TEE WITHOUT CARDIOVERSION N/A 09/13/2020   Procedure: TRANSESOPHAGEAL ECHOCARDIOGRAM (TEE);  Surgeon: Gaye Pollack, MD;  Location: [place];  Service: Open Heart Surgery;  Laterality: N/A;   TUBAL LIGATION  1990   Social:  Lives With: Daughter, son in Sports coach and 2 grandsons  Occupation: Retired Corporate treasurer  Support: Can make all decisions, great support at home   Level of Function: Independent in all ADLs and IADLs PCP: Dr. Adline Mango Substances: She smoked for 40-45 years and stopped after CABG. Drinks glass of wine occasionally  Family History: No pertinent family history   Allergies: Allergies as of 02/25/2022 - Review  Complete 02/25/2022  Allergen Reaction Noted   3-methyl-2-benzothiazolinone hydrazone Other (See Comments) 09/07/2012   Banana Anaphylaxis, Swelling, and Other (See Comments) 01/21/2018   Black walnut flavor Anaphylaxis and Itching 05/18/2019   Hazelnut (filbert) allergy skin test Anaphylaxis 05/18/2019   Leflunomide and related Other (See Comments) 07/23/2012   Lisinopril Swelling 01/05/2014   No healthtouch food allergies Anaphylaxis 05/18/2019   Other Other (See Comments), Anaphylaxis, and Swelling 09/07/2012   Pecan extract allergy skin test Anaphylaxis and Itching 05/18/2019   Pecan nut (diagnostic) Anaphylaxis and Itching 05/18/2019   Trazodone and nefazodone Other (See Comments) 07/23/2014   Adalimumab Other (See Comments) 07/23/2012   Escitalopram oxalate Other (See Comments) 09/29/2013   Ezetimibe Other (See Comments) 01/13/2018   Secukinumab Swelling 05/18/2019   Statins Other (See Comments) 09/04/2014   Ferrlecit [na ferric gluc cplx in sucrose]  09/19/2020   Gabapentin Other (See Comments) 06/26/2021   Ibuprofen Other (See Comments) 09/04/2014   Duloxetine Rash 08/28/2021    Review of Systems: A complete ROS was negative except as per HPI.   OBJECTIVE:   Physical Exam: Blood pressure (!) 178/77, pulse 70, temperature 97.8 F (36.6 C), temperature source Oral, resp. rate 17, SpO2 100 %.  Constitutional: Resting in bed, no acute distress HENT: normocephalic atraumatic Eyes: Pale conjunctiva Cardiovascular: Regular rate and rhythm, grade 3/6 systolic murmur best heard at right sternal border. Pulmonary/Chest: normal work of breathing on room air, lungs clear to auscultation bilaterally Abdominal: Soft, with protrusion noted near umbilicus which is reducible.  Normal active bowel sounds, no rashes noted.  There is diffuse tenderness noted to entire abdomen.  No rebound or guarding. MSK: normal bulk and tone Neurological: Sensation intact Skin: Sacral area noted with  bandage applied Psych: Normal mood and affect  Labs: CBC    Component Value Date/Time   WBC 4.5 02/25/2022 1252   RBC 3.54 (L) 02/25/2022 1252   HGB 11.0 (L) 02/25/2022 1252   HCT 36.9 02/25/2022 1252   PLT 211 02/25/2022 1252   MCV 104.2 (H) 02/25/2022 1252   MCV 94.5 10/12/2012 1211   MCH 31.1 02/25/2022 1252   MCHC 29.8 (L) 02/25/2022 1252   RDW 15.8 (H) 02/25/2022 1252   LYMPHSABS 0.9 02/25/2022 1252   MONOABS 0.4 02/25/2022 1252   EOSABS 0.5 02/25/2022 1252   BASOSABS 0.0 02/25/2022 1252     CMP     Component Value Date/Time   NA 135 02/25/2022 1252   K 3.9 02/25/2022 1252   CL 101 02/25/2022 1252   CO2 26 02/25/2022 1252   GLUCOSE 95 02/25/2022 1252   BUN 14 02/25/2022 1252   CREATININE 4.77 (H) 02/25/2022 1252   CREATININE 1.34 (H) 09/18/2011 1400   CALCIUM 8.3 (L) 02/25/2022 1252  PROT 6.3 (L) 02/25/2022 1252   ALBUMIN 3.0 (L) 02/25/2022 1252   AST 14 (L) 02/25/2022 1252   ALT 9 02/25/2022 1252   ALKPHOS 118 02/25/2022 1252   BILITOT 0.6 02/25/2022 1252   GFRNONAA 10 (L) 02/25/2022 1252   GFRAA 16 (L) 11/09/2019 1152    Imaging:  CT abd/ pelvis- abd ascites with mesenteric edema and diffuse body wall edema. Small bilateral pleual effusions, stable large vascular clips related to prior GI bleed  EKG: Patient EKG in ED showing normal sinus rhythm with left axis. No concerns for any ST segment changes. Patient does have prolonged QTC of 473. Unchanged from EKG previously.   ASSESSMENT & PLAN:   Assessment & Plan by Problem: Principal Problem:   Gastroenteritis   NAKAYLA RORABAUGH is a 62 y.o. female with a past medical history of ESRD on dialysis Monday, Wednesday, Friday, COPD, asthma, CAD status post CABG, hypertension, OSA, GERD, anemia who presented to the emergency room today with concerns of abdominal pain for the past day.  Patient admitted for further evaluation and management of abdominal pain.  #Abdominal pain Patient presents with 1 day  history of abdominal pain, nausea, and vomiting.  Patient does have a history of nausea and vomitting, especially after dialysis, but this abdominale pain is new.  Per history, patient did not try any new foods, uncooked foods, raw foods, or had any sick contacts.  Differential includes gastroenteritis, enterocolitis, gastroparesis, biliary colic, GI bleed, or pancreatitis. Could also consider chronic mesenteric ischemia but less likely as there is pain when the patient does not eat.  On exam patient is resting comfortable in bed in no acute distress. There is some diffuse abdominal tenderness noted with no rebound, guarding, or peritoneal signs. There is no fever, and there is no white count. Lipase minimally elevated.  CT imaging did show some diffuse edema with some small volume abdominal ascites. Less likely pancreatitis, gastroparesis, or biliary colic given history.  Less likely GI bleed given hemoglobin stable.  This could be a viral gastroenteritis or enterocolitis.  Patient did have p.o. challenge in the emergency room, after antiemetics and pain medication, and patient did not pass.  -Supportive care  -Slowly advance diet -Reglan PRN  -GI panel pending   #Severe Asymptomatic hypertension  Patient initially presented to the emergency room with blood pressure of 241/84.  Patient had hydralazine given to her in the emergency room, and blood pressure dropped back down to 178/77.  This could be in the setting of patient not absorbing her meds, given her vomiting.  This could also be elevated blood pressure in the setting of pain.  Patient has a long list of home medications, in which we will restart.  Patient's current home regimen includes Clonidine 0.3 mg 3 times daily, hydralazine 100 mg every 8 hours, olmesartan 40 mg daily, labetalol 400 mg 3 times daily, Imdur 60 mg daily, Procardia 60 mg twice daily. -Resume home clonidine 0.3 mg 3 times daily -Resume home hydralazine 100 mg every 8  hours -Start irbesartan 300 mg daily -Resume home nifedipine 60 mg daily -Resume home Imdur 60 mg daily -Start Labetalol 5mg  Q0H PRN for systolic greater than 474  -Goal blood pressure <160/<100  -Continue to monitor blood pressure  #COPD, 2 to 3 L oxygen at night #Asthma Patient has a past medical history of COPD and asthma.  Patient's home inhaler include Stiolto. Patient is also on montelukast.  No active concern about exacerbation. -Resume LAMA LABA therapy  inpatient with incruse and brovana  -Continue home montelukast -Monitor respiratory status -If patient needs oxygen at night, patient can use supplemental oxygen nasal cannula to keep SpO2 between 88 to 92%.  #ESRD on HD Monday, Wednesday, Friday Patient has a past medical history of ESRD with HD on Monday, Wednesday, Friday.  Patient had her last dialysis appointment yesterday, and tolerated her session well.  Patient will need dialysis during hospitalization. -Will consult nephrology -Follow-up RFP in the morning  #CAD status post CABGx2 #HFpEF #Aortic stenosis #Minor stenosis Patient has a past medical history of coronary artery disease status post CABG in August 2022.  Patient is not on any aspirin or statin therapy.  Most recent echo on 07/17/2021 showing ejection fraction of 65 to 70% with grade 2 diastolic dysfunction.  There is also mitral valve regurg and stenosis as well as aortic valve stenosis.  Per GI, patient is not a candidate for aspirin given GI bleeds.  Patient cannot tolerate statin.  Patient is on Imdur and labetalol.  No active concerns for ACS -Continue Imdur 60 mg daily -Continue labetalol 400 mg 3 times daily  #Paroxysmal atrial fibrillation Patient was noted to have postoperative Afib in 09/2020 and patient required to go on amiodarone. Patient remained in sinus rhythm and amiodarone has been discontinued in December 2022. Patient has not had any concerns with atrial fibrillation since. Patient EKG in the  ED showed normal sinus rhythm with left axis deviation. Patient not in Afib. -No active concerns    #Type 2 diabetes mellitus  Patient has a past medical history of T2DM. Patient most recent A1C on 02/12/2022 at 4.6. Patient does not take any diabetic medications at home. -SSI while inpatient   #GERD Patient take pantoprazole at home. Patient did have a history of GI bleeds with evidence of vascular clipping noted on CT scan. Patient with no signs or evidence of bleeding on my exam at this time.  -Continue home pantoprazole 40 mg daily   #Psoriatic arthritis Patient is on chronic prednisone 5 mg daily for psoriatic arthritis. Patient has no concerns at the moment. -Resume home prednisone 5 mg daily   #Anxiety Patient has no concerns for anxiety on my exam. Patient does take Ativan 1mg  Q8H for anxiety.  -Hold home ativan    #OSA Patient has a past medical history of OSA and is non-compliant with her CPAP. Patient is vomiting, will hold off on CPAP for now  -Hold off on CPAP for now   #Insomnia  Patient does take zolpidem 5 mg nightly.  -Resume home zolpidem 5 mg nightly   Diet: Clear liquids  VTE: Heparin IVF: None,None Code: Full  Prior to Admission Living Arrangement: Home, living with daughter Anticipated Discharge Location: Home Barriers to Discharge: Clinical improvement   Dispo: Admit patient to Observation with expected length of stay less than 2 midnights.  Signed: Leigh Aurora, DO Internal Medicine Resident PGY-1 Pager: [phone] 02/25/2022, 9:03 PM

## 2022-02-25 NOTE — Hospital Course (Addendum)
Active Problems:   Abdominal pain  Resolved Problems:   Abdominal pain, epigastric  Consults:***  Procedures:***  Follow-up items: restart aspirin?

## 2022-02-25 NOTE — ED Triage Notes (Signed)
Pt BIB [facility] from home d/t abd pain with n/v/d for approx.  2 weeks. She is a dialysis pt on MWF & has NOT missed any Tx. Lately her HGB has been low, was given iron yesterday. Does have Hx of GL bleeds pt reports abd pain feeling now does feel similar. EMS reports her coffee ground emesis started today & they did witness one emesis event on scene & pt reports black tarry stools started a few days ago. Her abd pain starts in her ULQ then radiates down her abd. Hx of HTN as well & EMS reports 2 pressures of 230/108 then 200/100. Resp 18, 70 bpm, 97% on RA, wears home O2 PRN, on RA now, CBG 101. Pain 5/10 then 10/10 when the pain intensifies feeling like labor pains. A/Ox4.

## 2022-02-25 NOTE — ED Notes (Signed)
Patients grandson came out and states that after the crackers and ginger ale she is starting to feel sick again and really doesn't feel comfortable going home alone. Notified MD

## 2022-02-25 NOTE — ED Notes (Signed)
Gave patient some crackers and ginger ale

## 2022-02-25 NOTE — ED Notes (Signed)
Admitting physicians at bedside evaluating patient and going over admission plans

## 2022-02-25 NOTE — ED Provider Notes (Signed)
[facility] EMERGENCY DEPARTMENT Provider Note   CSN: [id number] Arrival date & time: 02/25/22  1235     History  Chief Complaint  Patient presents with   Abdominal Pain    Jodi Bell is a 62 y.o. female w/ hx of ESRD on dialysis MWF presenting to ED with abdominal pain.  She reports gradual onset LLQ abdominal pain, onset insidiously last week, and has been more or less constant all week.  The pain seems to be worse after she eats, sometimes triggering vomiting episodes, then relieved after vomiting.  She generally suffers from constipation and takes stool softeners but did have some loose bowel movements earlier this week.  She said EMS gave her a dose of Zofran under the tongue and it relieved her nausea and she currently has very minimal discomfort or pain in her abdomen, but it is still present.  She does see gastroenterology and has chronic reflux and GERD, takes PPIs for this, and has a barium swallow scheduled later this month per her report.  She has not missed any dialysis sessions this week.  She was not able to keep down her home medications including her blood pressure medicines today.  She makes a very small amount of urine, not on a daily basis.  HPI     Home Medications Prior to Admission medications   Medication Sig Start Date End Date Taking? Authorizing Provider  acetaminophen (TYLENOL) 500 MG tablet Take 1,000 mg by mouth daily as needed (pain).    [provider]  albuterol (PROVENTIL) (2.5 MG/3ML) 0.083% nebulizer solution Take 3 mLs (2.5 mg total) by nebulization every 6 (six) hours as needed for wheezing or shortness of breath. 07/10/21   Hunsucker, Bonna Gains, MD  allopurinol (ZYLOPRIM) 100 MG tablet Take 100 mg by mouth every evening.    [provider]  aspirin EC 81 MG tablet Take 1 tablet (81 mg total) by mouth daily. Swallow whole. 10/15/21   Leanor Kail, PA  calcium acetate (PHOSLO) 667 MG capsule Take 1,334 mg  by mouth 3 (three) times daily with meals. 05/13/21   [provider]  carboxymethylcellulose 1 % ophthalmic solution Place 2 drops into both eyes 2 (two) times daily as needed (dry eyes).    [provider]  cetirizine (ZYRTEC) 10 MG tablet Take 10 mg by mouth daily as needed for allergies.    [provider]  cholecalciferol (VITAMIN D) 25 MCG tablet Take 1 tablet (1,000 Units total) by mouth every evening. 10/04/20   Gold, Wilder Glade, PA-C  cholecalciferol (VITAMIN D3) 10 MCG (400 UNIT) TABS tablet Take 800 Units by mouth daily.    [provider]  cinacalcet (SENSIPAR) 60 MG tablet Take 60 mg by mouth daily.    [provider]  cloNIDine (CATAPRES) 0.3 MG tablet Take 0.3 mg by mouth 3 (three) times daily.    [provider]  cyclobenzaprine (FLEXERIL) 10 MG tablet Take 10 mg by mouth 2 (two) times daily as needed for muscle spasms.    [provider]  Darbepoetin Alfa (ARANESP) 200 MCG/0.4ML SOSY injection Inject 0.4 mLs (200 mcg total) into the vein every Wednesday with hemodialysis. 10/10/20   Jadene Pierini E, PA-C  diclofenac Sodium (VOLTAREN) 1 % GEL Apply 2 g topically daily as needed (pain).    [provider]  docusate sodium (COLACE) 100 MG capsule Take 100-200 mg by mouth daily as needed for mild constipation.    [provider]  ferrous sulfate 325 (65 FE) MG tablet Take 325 mg by mouth See admin instructions. Take 325 mg by mouth three times a week Tues, Thur, Sat    [provider]  folic acid (FOLVITE) 616 MCG tablet Take 400 mcg by mouth daily.    [provider]  gabapentin (NEURONTIN) 300 MG capsule Take 1 capsule (300 mg total) by mouth at bedtime as needed (Neuropathic pain). Patient taking differently: Take 300 mg by mouth daily as needed (Neuropathic pain). 12/14/17 10/21/21  Dana Allan I, MD  heparin 1000 unit/mL SOLN injection 3 mLs (3,000 Units total) by Dialysis route as needed  (in dialysis as needed). 10/04/20   Jadene Pierini E, PA-C  heparin 1000 unit/mL SOLN injection 2 mLs (2,000 Units total) by Dialysis route as needed (in dialysis as needed). 10/04/20   Jadene Pierini E, PA-C  heparin 1000 unit/mL SOLN injection 1 mL (1,000 Units total) by Dialysis route as needed (in dialysis). 10/04/20   Jadene Pierini E, PA-C  hydrALAZINE (APRESOLINE) 100 MG tablet Take 1 tablet (100 mg total) by mouth every 8 (eight) hours. Patient taking differently: Take 100 mg by mouth 3 (three) times daily. 07/06/20   Cresenzo, Angelyn Punt, MD  hydrocortisone (ANUSOL-HC) 2.5 % rectal cream Place rectally 2 (two) times daily. Patient taking differently: Place 1 Application rectally daily as needed for hemorrhoids or anal itching. 07/23/21   [name], MD  isosorbide mononitrate (IMDUR) 60 MG 24 hr tablet Take 1 tablet (60 mg total) by mouth daily. 08/14/21   Bhagat, Crista Luria, PA  labetalol (NORMODYNE) 200 MG tablet Take 2 tablets (400 mg total) by mouth 3 (three) times daily. 07/11/21   Bhagat, Bhavinkumar, PA  lidocaine (LIDODERM) 5 % Place 1 patch onto the skin every 12 (twelve) hours as needed. Remove & Discard patch within 12 hours or as directed by MD Patient taking differently: Place 1 patch onto the skin daily as needed (pain). Remove & Discard patch within 12 hours or as directed by MD 07/23/21   [name], MD  LORazepam (ATIVAN) 1 MG tablet Take 1 mg by mouth every 8 (eight) hours as needed for anxiety. 06/25/20   [provider]  montelukast (SINGULAIR) 10 MG tablet Take 10 mg by mouth daily with supper.     [provider]  NIFEdipine (PROCARDIA XL/NIFEDICAL XL) 60 MG 24 hr tablet Take 60 mg by mouth 2 (two) times daily. 10/01/21   [provider]  olmesartan (BENICAR) 40 MG tablet Take 40 mg by mouth every evening. 05/06/19   [provider]  ondansetron (ZOFRAN) 4 MG tablet Take 4 mg by mouth 2 (two) times daily as needed for nausea or vomiting.    [provider]  OVER THE COUNTER MEDICATION Take by mouth daily as needed (sickness). Elderberry Fruit and flower 1 dropper full by mouth daily as needed for sickness    [provider]  Oxycodone HCl 10 MG TABS Take 10 mg by mouth daily as needed for pain. 01/11/21   [provider]  OXYGEN Inhale 2 L into the lungs as directed.    [provider]  pantoprazole (PROTONIX) 40 MG tablet Take 40 mg by mouth daily. 06/09/20   [provider]  Probiotic Product (PROBIOTIC PO) Take 1 capsule by mouth daily.    [provider]  sorbitol 70 % SOLN Take 30 ml 1-2 times daily as needed for constipation 07/23/21   [name], MD  Tiotropium Bromide-Olodaterol ([place])  2.5-2.5 MCG/ACT AERS Inhale 2 puffs once daily into the lungs. 11/28/21   Hunsucker, Bonna Gains, MD  VENTOLIN HFA 108 (90 Base) MCG/ACT inhaler Inhale 2 puffs into the lungs See admin instructions. Take 2 puffs every 4-6 hours as needed for wheezing and shortness of breath 07/10/21   Hunsucker, Bonna Gains, MD  zolpidem (AMBIEN) 5 MG tablet Take 5 mg by mouth at bedtime as needed for sleep. 02/04/21   [provider]      Allergies    3-methyl-2-benzothiazolinone hydrazone, Banana, Black walnut flavor, Hazelnut (filbert) allergy skin test, Leflunomide and related, Lisinopril, No healthtouch food allergies, Other, Pecan extract allergy skin test, Pecan nut (diagnostic), Trazodone and nefazodone, Adalimumab, Escitalopram oxalate, Ezetimibe, Secukinumab, Statins, Ferrlecit [na ferric gluc cplx in sucrose], Gabapentin, Ibuprofen, and Duloxetine    Review of Systems   Review of Systems  Physical Exam Updated Vital Signs BP (!) 210/82   Pulse 71   Temp 98.4 F (36.9 C)   Resp 16   SpO2 98%  Physical Exam Constitutional:      General: She is not in acute distress. HENT:     Head: Normocephalic and atraumatic.  Eyes:     Conjunctiva/sclera: Conjunctivae normal.     Pupils: Pupils  are equal, round, and reactive to light.  Cardiovascular:     Rate and Rhythm: Normal rate and regular rhythm.  Pulmonary:     Effort: Pulmonary effort is normal. No respiratory distress.  Abdominal:     General: There is no distension.     Tenderness: There is abdominal tenderness in the left lower quadrant. There is no rebound.  Skin:    General: Skin is warm and dry.  Neurological:     General: No focal deficit present.     Mental Status: She is alert. Mental status is at baseline.  Psychiatric:        Mood and Affect: Mood normal.        Behavior: Behavior normal.     ED Results / Procedures / Treatments   Labs (all labs ordered are listed, but only abnormal results are displayed) Labs Reviewed  COMPREHENSIVE METABOLIC PANEL - Abnormal; Notable for the following components:      Result Value   Creatinine, Ser 4.77 (*)    Calcium 8.3 (*)    Total Protein 6.3 (*)    Albumin 3.0 (*)    AST 14 (*)    GFR, Estimated 10 (*)    All other components within normal limits  CBC WITH DIFFERENTIAL/PLATELET - Abnormal; Notable for the following components:   RBC 3.54 (*)    Hemoglobin 11.0 (*)    MCV 104.2 (*)    MCHC 29.8 (*)    RDW 15.8 (*)    All other components within normal limits  LIPASE, BLOOD - Abnormal; Notable for the following components:   Lipase 62 (*)    All other components within normal limits  GASTROINTESTINAL PANEL BY PCR, STOOL (REPLACES STOOL CULTURE)  PROTIME-INR  CBC  RENAL FUNCTION PANEL  TYPE AND SCREEN    EKG None  Radiology CT ABDOMEN PELVIS W CONTRAST  Result Date: 02/25/2022 CLINICAL DATA:  Left lower quadrant abdominal pain. EXAM: CT ABDOMEN AND PELVIS WITH CONTRAST TECHNIQUE: Multidetector CT imaging of the abdomen and pelvis was performed using the standard protocol following bolus administration of intravenous contrast. RADIATION DOSE REDUCTION: This exam was performed according to the departmental dose-optimization program which includes  automated exposure control, adjustment of the mA  and/or kV according to patient size and/or use of iterative reconstruction technique. CONTRAST:  80mL OMNIPAQUE IOHEXOL 350 MG/ML SOLN COMPARISON:  CT scan 09/09/2020 FINDINGS: Lower chest: Very small bilateral pleural effusions with overlying bibasilar atelectasis. Stable cardiac enlargement but no pericardial effusion. Stable aortic and coronary artery calcifications, advanced for age. Hepatobiliary: No hepatic lesions or intrahepatic biliary dilatation. The gallbladder demonstrates moderate wall thickening possibly related to the underlying abdominal fluid and possible low albumin. This was also present on the prior CT scan and is unlikely acute cholecystitis. No common bile duct dilatation. Pancreas: No mass, inflammation or ductal dilatation. Spleen: Normal size.  No worrisome lesions. Adrenals/Urinary Tract: Adrenal glands are. Numerous bilateral renal cysts are stable and do not need any further imaging evaluation or follow-up. The bladder is grossly normal. It is largely decompressed. Stomach/Bowel: The stomach contains 2 large vascular clips likely related to prior GI bleed. No acute inflammatory process. The duodenum, small bowel and colon are grossly normal. No obstructive findings. Diffuse colonic diverticulosis without findings for acute diverticulitis. Vascular/Lymphatic: Advanced atherosclerotic calcifications involving the aorta and branch vessels. The major venous structures are patent. No mesenteric or retroperitoneal mass or adenopathy. Reproductive: The uterus and ovaries are grossly normal. Other: Small volume abdominal ascites along with mesenteric edema and diffuse body wall edema. Small to moderate amount of free pelvic fluid. Musculoskeletal: Chronic severe compression fracture of L3 with fusion hardware at L3-4 and severe degenerative changes and possibly post infectious changes at L2-3. Stable bone lesions at T12 and L4. IMPRESSION: 1. Small  volume abdominal ascites along with mesenteric edema and diffuse body wall edema. 2. Diffuse colonic diverticulosis without findings for acute diverticulitis. 3. Very small bilateral pleural effusions with overlying bibasilar atelectasis. 4. Stable cardiac enlargement. 5. Advanced atherosclerotic calcifications involving the aorta and branch vessels. 6. Stable large vascular clips in the stomach likely related to prior GI bleed. 7. Severe chronic changes involving the lumbar spine. Aortic Atherosclerosis (ICD10-I70.0). Electronically Signed   By: Marijo Sanes M.D.   On: 02/25/2022 16:47    Procedures Procedures    Medications Ordered in ED Medications  heparin injection 5,000 Units (has no administration in time range)  cloNIDine (CATAPRES) tablet 0.3 mg (has no administration in time range)  hydrALAZINE (APRESOLINE) tablet 100 mg (has no administration in time range)  isosorbide mononitrate (IMDUR) 24 hr tablet 60 mg (has no administration in time range)  labetalol (NORMODYNE) tablet 400 mg (has no administration in time range)  montelukast (SINGULAIR) tablet 10 mg (has no administration in time range)  NIFEdipine (PROCARDIA XL/NIFEDICAL XL) 24 hr tablet 60 mg (has no administration in time range)  irbesartan (AVAPRO) tablet 300 mg (has no administration in time range)  pantoprazole (PROTONIX) EC tablet 40 mg (has no administration in time range)  arformoterol (BROVANA) nebulizer solution 15 mcg (has no administration in time range)    And  umeclidinium bromide (INCRUSE ELLIPTA) 62.5 MCG/ACT 1 puff (has no administration in time range)  insulin aspart (novoLOG) injection 0-6 Units (has no administration in time range)  predniSONE (DELTASONE) tablet 5 mg (has no administration in time range)  zolpidem (AMBIEN) tablet 5 mg (has no administration in time range)  metoCLOPramide (REGLAN) injection 5 mg (has no administration in time range)  ondansetron (ZOFRAN-ODT) disintegrating tablet 4 mg (4  mg Oral Given 02/25/22 1308)  sodium chloride 0.9 % bolus 500 mL (0 mLs Intravenous Stopped 02/25/22 1759)  hydrALAZINE (APRESOLINE) injection 20 mg (20 mg Intravenous Given 02/25/22 1535)  iohexol (OMNIPAQUE) 350 MG/ML injection 75 mL (75 mLs Intravenous Contrast Given 02/25/22 1630)  metoCLOPramide (REGLAN) injection 5 mg (5 mg Intravenous Given 02/25/22 1651)  morphine (PF) 2 MG/ML injection 2 mg (2 mg Intravenous Given 02/25/22 2016)    ED Course/ Medical Decision Making/ A&P Clinical Course as of 02/25/22 2259  Tue Feb 25, 2022  1822 Attempting p.o. challenge with food [MT]  1954 Pt unable to tolerate food and fluids, will admit to hospital [MT]    Clinical Course User Index [MT] Vy Badley, Carola Rhine, MD                             Medical Decision Making Amount and/or Complexity of Data Reviewed Radiology: ordered.  Risk Prescription drug management. Decision regarding hospitalization.   This patient presents to the ED with concern for lower abdominal pain. This involves an extensive number of treatment options, and is a complaint that carries with it a high risk of complications and morbidity.  The differential diagnosis includes diverticulitis versus colitis versus ileus versus SBO versus other  UTI and ureteral colic are less likely given her dialysis status, producing only a very small amount of urine.  Co-morbidities that complicate the patient evaluation: End-stage renal disease on dialysis, fluid balance is difficult, high risk of dehydration and electrolyte derangement  Additional history obtained from EMS  External records from outside source obtained and reviewed including right upper quadrant ultrasound performed in October 2022 which showed multiple renal cysts, hepatic steatosis findings, and some very mild gallbladder wall thickening but otherwise normal gallbladder and common bile duct 2 mm.  I ordered and personally interpreted labs.  The pertinent results include:   No transminitis, Lipase 62, Hgb baseline 11.0, WBC wnl, K 3.9  I ordered imaging studies including CT abdomen pelvis I independently visualized and interpreted imaging which showed bowel wall thickening I agree with the radiologist interpretation  I ordered medication including IV hydralazine for HTN, as patient was unable initially to take her home PO medications.  Small fluid bolus for persistent vomiting, rehydration  I have reviewed the patients home medicines and have made adjustments as needed  Test Considered: doubt PE, sepsis, acute biliary disease, mesenteric ischemia    After the interventions noted above, I reevaluated the patient and found that they have: stayed the same  Dispostion:  After consideration of the diagnostic results and the patients response to treatment, I feel that the patent would benefit from admission for poor oral intake, suspected secondary to viral gastroenteritis.         Final Clinical Impression(s) / ED Diagnoses Final diagnoses:  Abdominal pain, unspecified abdominal location  Enteritis  Nausea and vomiting, unspecified vomiting type    Rx / DC Orders ED Discharge Orders     None         Wyvonnia Dusky, MD 02/25/22 2259

## 2022-02-25 NOTE — ED Provider Triage Note (Signed)
Emergency Medicine Provider Triage Evaluation Note  Jodi Bell , a 62 y.o. female  was evaluated in triage.  Pt complains of black stools, nausea, vomiting, and lower abdominal pain starting this morning. History of GI bleed in June 2023 requiring hospitalization and blood transfusion. Denies chest pain, shortness of breath, fever, chills, diarrhea, hematemesis. MWF dialysis and states she has not missed any sessions.   Review of Systems  Positive: See HPI Negative: See HPI  Physical Exam  BP (!) 241/84 (BP Location: Right Arm)   Pulse 71   Temp 97.8 F (36.6 C)   Resp 18   SpO2 93%  Gen:   Awake, no distress   Resp:  Normal effort LCTA on [place] O2 at baseline MSK:   Moves extremities without difficulty  Other:  Diffuse lower abdominal tenderness, no rebound or guarding, abdomen soft and non distended, pale conjunctiva, alert and oriented with nonfocal neuro exam  Medical Decision Making  Medically screening exam initiated at 12:52 PM.  Appropriate orders placed.  [name] was informed that the remainder of the evaluation will be completed by another provider, this initial triage assessment does not replace that evaluation, and the importance of remaining in the ED until their evaluation is complete.     Suzzette Righter, PA-C 02/25/22 1254

## 2022-02-26 DIAGNOSIS — R109 Unspecified abdominal pain: Secondary | ICD-10-CM | POA: Diagnosis not present

## 2022-02-26 LAB — RENAL FUNCTION PANEL
Albumin: 2.6 g/dL — ABNORMAL LOW (ref 3.5–5.0)
Anion gap: 8 (ref 5–15)
BUN: 14 mg/dL (ref 8–23)
CO2: 27 mmol/L (ref 22–32)
Calcium: 8.6 mg/dL — ABNORMAL LOW (ref 8.9–10.3)
Chloride: 100 mmol/L (ref 98–111)
Creatinine, Ser: 5.63 mg/dL — ABNORMAL HIGH (ref 0.44–1.00)
GFR, Estimated: 8 mL/min — ABNORMAL LOW (ref >60)
Glucose, Bld: 102 mg/dL — ABNORMAL HIGH (ref 70–99)
Phosphorus: 3.6 mg/dL (ref 2.5–4.6)
Potassium: 3.7 mmol/L (ref 3.5–5.1)
Sodium: 135 mmol/L (ref 135–145)

## 2022-02-26 LAB — GLUCOSE, CAPILLARY
Glucose-Capillary: 64 mg/dL — ABNORMAL LOW (ref 70–99)
Glucose-Capillary: 120 mg/dL — ABNORMAL HIGH (ref 70–99)

## 2022-02-26 LAB — CBC
HCT: 35.3 % — ABNORMAL LOW (ref 36.0–46.0)
Hemoglobin: 10.5 g/dL — ABNORMAL LOW (ref 12.0–15.0)
MCH: 30.6 pg (ref 26.0–34.0)
MCHC: 29.7 g/dL — ABNORMAL LOW (ref 30.0–36.0)
MCV: 102.9 fL — ABNORMAL HIGH (ref 80.0–100.0)
Platelets: 219 10*3/uL (ref 150–400)
RBC: 3.43 MIL/uL — ABNORMAL LOW (ref 3.87–5.11)
RDW: 16.1 % — ABNORMAL HIGH (ref 11.5–15.5)
WBC: 5.1 10*3/uL (ref 4.0–10.5)
nRBC: 0 % (ref 0.0–0.2)

## 2022-02-26 LAB — HEPATITIS B SURFACE ANTIGEN: Hepatitis B Surface Ag: NONREACTIVE

## 2022-02-26 MED ORDER — PREDNISONE 5 MG PO TABS: 5.0000 mg | ORAL_TABLET | Freq: Every day | ORAL | Status: DC

## 2022-02-26 MED ORDER — LABETALOL HCL 200 MG PO TABS
400.0000 mg | ORAL_TABLET | Freq: Three times a day (TID) | ORAL | Status: DC
  Administered 2022-02-26: 400 mg via ORAL
  Filled 2022-02-26 (×2): qty 2

## 2022-02-26 MED ORDER — HYDRALAZINE HCL 20 MG/ML IJ SOLN
5.0000 mg | Freq: Once | INTRAMUSCULAR | Status: AC
  Administered 2022-02-26: 5 mg via INTRAVENOUS
  Filled 2022-02-26: qty 1

## 2022-02-26 MED ORDER — ACETAMINOPHEN 500 MG PO TABS
1000.0000 mg | ORAL_TABLET | Freq: Once | ORAL | Status: AC
  Administered 2022-02-26: 1000 mg via ORAL
  Filled 2022-02-26: qty 2

## 2022-02-26 MED ORDER — ONDANSETRON HCL 4 MG/2ML IJ SOLN
4.0000 mg | Freq: Three times a day (TID) | INTRAMUSCULAR | Status: DC | PRN
  Filled 2022-02-26: qty 2

## 2022-02-26 MED ORDER — CHLORHEXIDINE GLUCONATE CLOTH 2 % EX PADS
6.0000 | MEDICATED_PAD | Freq: Every day | CUTANEOUS | Status: DC
  Administered 2022-02-26: 6 via TOPICAL

## 2022-02-26 MED ORDER — HYDRALAZINE HCL 50 MG PO TABS
100.0000 mg | ORAL_TABLET | Freq: Three times a day (TID) | ORAL | Status: DC
  Administered 2022-02-26: 100 mg via ORAL
  Filled 2022-02-26: qty 2

## 2022-02-26 MED ORDER — IRBESARTAN 300 MG PO TABS
300.0000 mg | ORAL_TABLET | Freq: Every day | ORAL | Status: DC
  Filled 2022-02-26: qty 1

## 2022-02-26 MED ORDER — CLONIDINE HCL 0.1 MG PO TABS
0.3000 mg | ORAL_TABLET | Freq: Three times a day (TID) | ORAL | Status: DC
  Administered 2022-02-26: 0.3 mg via ORAL
  Filled 2022-02-26: qty 3

## 2022-02-26 NOTE — Progress Notes (Signed)
Patients BP is 227/79 MD is aware she is symptom free. We will recheck in 49min but provider is ok with her discharging with an elevated BP due to her receiving oral medication at 1502 awaiting the peak effect. Will notify providers with BP when it is rechecked.

## 2022-02-26 NOTE — Discharge Instructions (Addendum)
Ms. Jodi Bell  You were admitted for abdominal discomfort, nausea, vomiting, and treated for gastroenteritis. We are discharging you home now that you are doing better. To help assist you on your road to recovery, I have written the following recommendations:   Keep all of your appointments with nephrology, GI, and your PCP.  Wash your hands frequently, especially after using the restroom, to prevent spreading illness to others.  Call your doctor or return to the hospital for signs of worsening illness, including but not limited to: nausea and vomiting that worsens or does not improve after several days, severe abdominal pain, blood in stool or vomit, fevers, or inability to eat or drink.  It was a privilege to be a part of your hospital care team, and I hope you feel better as a result of your stay.  All the best, Nani Gasser, MD

## 2022-02-26 NOTE — Progress Notes (Signed)
Patient is ready for discharge we are trying to get her to stay another night due to her blood pressure being high. But patient wants to go home. States she has extra fluid on her due to her missing dialysis today.

## 2022-02-26 NOTE — Progress Notes (Signed)
Patient is ready for discharge. IV is removed at this time. I will print AVS when family arrives she is currently waiting on her son to bring keys and clothes.

## 2022-02-26 NOTE — Progress Notes (Signed)
Brief note-  Admitted for abd pain, resolved. Eating now Today is her HD day Arranged OP HD tomorrow and then resume regular HD Friday Greatly appreciate renal navigator  Call with questions  Madelon Lips MD [facility] [phone]

## 2022-02-26 NOTE — Discharge Summary (Signed)
Name: Jodi Bell MRN: [id number] DOB: November 12, 1960 62 y.o. PCP: [name]., MD  Date of Admission: 02/25/2022 12:35 PM Date of Discharge: 02/26/2022 3:51 PM Attending Physician: Aldine Contes, MD  Discharge Diagnosis: Active Problems:   Abdominal pain   Discharge Medications: Allergies as of 02/26/2022       Reactions   3-methyl-2-benzothiazolinone Hydrazone Other (See Comments)   Muscle weakness muscle cramping in legs Other reaction(s): Myalgias (Muscle Pain)   Banana Anaphylaxis, Swelling, Other (See Comments)   TONGUE AND MOUTH SWELLING   Black Walnut Flavor Anaphylaxis, Itching   Walnuts   Hazelnut (filbert) Allergy Skin Test Anaphylaxis   Hazelnuts   Leflunomide And Related Other (See Comments)   Severe Headache   Lisinopril Swelling   Angioedema  Swelling of the face   No [employer] Food Allergies Anaphylaxis   Spicy Mustard 4.7.2021 Pt reports that she eats Regular Yellow Mustard Swelling and itching of tongue   Other Other (See Comments), Anaphylaxis, Swelling   Cosentyx= angioedema Other reaction(s): Facial Edema (intolerance) Mouth itches and tongue swelling Hazel nuts and pecans also Walnuts Walnuts Renal failure Other reaction(s): Other Serotonin Syndrome  Serotonin syndrome  Tremors Other reaction(s): Other Tremors Muscle weakness muscle cramping in legs Other reaction(s): Myalgias (Muscle Pain) Mouth itches and tongue swelling Hazel nuts and pecans also Walnuts   Pecan Extract Allergy Skin Test Anaphylaxis, Itching   Pecans   Pecan Nut (diagnostic) Anaphylaxis, Itching   Pecans   Trazodone And Nefazodone Other (See Comments)   Serotonin Syndrome  Tremors   Adalimumab Other (See Comments)   Blisters on abdomen =humira   Escitalopram Oxalate Other (See Comments)   Hand problems - Serotonin Syndrome    Ezetimibe Other (See Comments)   myalgias cramps   Secukinumab Swelling   Cosentyx = angiodema   Statins Other (See  Comments)   Leg pains and weakness   Ferrlecit [na Ferric Gluc Cplx In Sucrose]    Not reaction given from HD   Gabapentin Other (See Comments)   tremors   Ibuprofen Other (See Comments)   Renal Problems   Duloxetine Rash        Medication List     STOP taking these medications    aspirin EC 81 MG tablet       TAKE these medications    acetaminophen 500 MG tablet Commonly known as: TYLENOL Take 1,000 mg by mouth daily as needed (pain).   albuterol (2.5 MG/3ML) 0.083% nebulizer solution Commonly known as: PROVENTIL Take 3 mLs (2.5 mg total) by nebulization every 6 (six) hours as needed for wheezing or shortness of breath.   Ventolin HFA 108 (90 Base) MCG/ACT inhaler Generic drug: albuterol Inhale 2 puffs into the lungs See admin instructions. Take 2 puffs every 4-6 hours as needed for wheezing and shortness of breath   allopurinol 100 MG tablet Commonly known as: ZYLOPRIM Take 100 mg by mouth every evening.   calcium acetate 667 MG capsule Commonly known as: PHOSLO Take 1,334 mg by mouth 3 (three) times daily with meals.   carboxymethylcellulose 1 % ophthalmic solution Place 2 drops into both eyes 2 (two) times daily as needed (dry eyes).   cetirizine 10 MG tablet Commonly known as: ZYRTEC Take 10 mg by mouth daily as needed for allergies.   cinacalcet 60 MG tablet Commonly known as: SENSIPAR Take 60 mg by mouth daily.   cloNIDine 0.3 MG tablet Commonly known as: CATAPRES Take 0.3 mg by mouth 3 (three) times daily.  cyclobenzaprine 10 MG tablet Commonly known as: FLEXERIL Take 10 mg by mouth 2 (two) times daily as needed for muscle spasms.   Darbepoetin Alfa 200 MCG/0.4ML Sosy injection Commonly known as: ARANESP Inject 0.4 mLs (200 mcg total) into the vein every Wednesday with hemodialysis.   diclofenac Sodium 1 % Gel Commonly known as: VOLTAREN Apply 2 g topically daily as needed (pain).   docusate sodium 100 MG capsule Commonly known as:  COLACE Take 100-200 mg by mouth daily as needed for mild constipation.   ferrous sulfate 325 (65 FE) MG tablet Take 325 mg by mouth See admin instructions. Take 325 mg by mouth three times a week Tues, Thur, Sat   folic acid 562 MCG tablet Commonly known as: FOLVITE Take 400 mcg by mouth daily.   gabapentin 300 MG capsule Commonly known as: Neurontin Take 1 capsule (300 mg total) by mouth at bedtime as needed (Neuropathic pain). What changed: when to take this   heparin 1000 unit/mL Soln injection 3 mLs (3,000 Units total) by Dialysis route as needed (in dialysis as needed).   heparin 1000 unit/mL Soln injection 2 mLs (2,000 Units total) by Dialysis route as needed (in dialysis as needed).   heparin 1000 unit/mL Soln injection 1 mL (1,000 Units total) by Dialysis route as needed (in dialysis).   hydrALAZINE 100 MG tablet Commonly known as: APRESOLINE Take 1 tablet (100 mg total) by mouth every 8 (eight) hours. What changed: when to take this   isosorbide mononitrate 60 MG 24 hr tablet Commonly known as: IMDUR Take 1 tablet (60 mg total) by mouth daily.   labetalol 200 MG tablet Commonly known as: NORMODYNE Take 2 tablets (400 mg total) by mouth 3 (three) times daily.   lidocaine 5 % Commonly known as: LIDODERM Place 1 patch onto the skin every 12 (twelve) hours as needed. Remove & Discard patch within 12 hours or as directed by MD What changed:  when to take this reasons to take this   LORazepam 1 MG tablet Commonly known as: ATIVAN Take 1 mg by mouth every 8 (eight) hours as needed for anxiety.   montelukast 10 MG tablet Commonly known as: SINGULAIR Take 10 mg by mouth daily with supper.   NIFEdipine 60 MG 24 hr tablet Commonly known as: PROCARDIA XL/NIFEDICAL XL Take 60 mg by mouth 2 (two) times daily.   olmesartan 40 MG tablet Commonly known as: BENICAR Take 40 mg by mouth every evening.   ondansetron 4 MG tablet Commonly known as: ZOFRAN Take 4 mg by  mouth 2 (two) times daily as needed for nausea or vomiting.   OVER THE COUNTER MEDICATION Take by mouth daily as needed (sickness). Elderberry Fruit and flower 1 dropper full by mouth daily as needed for sickness   Oxycodone HCl 10 MG Tabs Take 10 mg by mouth daily as needed for pain.   OXYGEN Inhale 2 L into the lungs as directed.   pantoprazole 40 MG tablet Commonly known as: PROTONIX Take 40 mg by mouth daily.   predniSONE 5 MG tablet Commonly known as: DELTASONE Take 1 tablet (5 mg total) by mouth daily with breakfast. Start taking on: February 27, 2022   PROBIOTIC PO Take 1 capsule by mouth daily.   Proctozone-HC 2.5 % rectal cream Generic drug: hydrocortisone Place rectally 2 (two) times daily. What changed:  how much to take when to take this reasons to take this   sorbitol 70 % Soln Take 30 ml 1-2 times daily  as needed for constipation   Stiolto Respimat 2.5-2.5 MCG/ACT Aers Generic drug: Tiotropium Bromide-Olodaterol Inhale 2 puffs once daily into the lungs.   cholecalciferol 10 MCG (400 UNIT) Tabs tablet Commonly known as: VITAMIN D3 Take 800 Units by mouth daily.   vitamin D3 25 MCG tablet Commonly known as: CHOLECALCIFEROL Take 1 tablet (1,000 Units total) by mouth every evening.   zolpidem 5 MG tablet Commonly known as: AMBIEN Take 5 mg by mouth at bedtime as needed for sleep.        Disposition and follow-up:   Ms. Jodi Bell is a 62 y.o. year old admitted for nausea, vomiting, and abdominal discomfort.  Impression was for a mild gastritis.  Her condition improved with supportive therapy.  Nausea, vomiting Abdominal discomfort Low index of suspicion for pancreatitis, mesenteric ischemia, biliary disorder, GI bleed.  Her condition improved with supportive therapy.  She was able to tolerate a solid meal on day of discharge.  Pending labs/ test needing follow-up: - GI pathogen panel  Follow-up Appointments:  [place]. Go on 02/27/2022.   Why: Please arrive on Thursday at 6:00 am for 6:20 am chair time. This appt is make up appt for Wednesday.        [name]., MD. Call today.   Specialty: Internal Medicine Why: Call your PCP today for the next available appointment. Contact information: [address] [place] [address] [phone]                 Hospital Course by problem list:  Nausea, vomiting, and abdominal pain Presents with 1 day history of symptoms.  Workup was negative for pancreatitis, biliary etiology, GI bleeding, mesenteric ischemia.  She was treated with supportive therapy including proton pump inhibitors and ondansetron.  After overnight hospital stay she felt improved and was able to tolerate a solid lunch on day of discharge.  Severe hypertension With blood pressure of 241/84.  Did not appear to be having any symptoms.  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  She takes hydralazine 100 mg 3 times daily, labetalol 400 mg 3 times daily, clonidine 0.3 mg 3 times daily.  Suspect some of this hypertension was due to rebound from discontinuation of clonidine on admission.  These medications were restarted on day of discharge.  Discharge Exam:  Feeling better today.  Tolerating a solid diet.  Still has some remaining abdominal discomfort, nausea, and vomiting, but these symptoms are all improved.   Blood pressure (!) 238/81, pulse 72, temperature 98.7 F (37.1 C), temperature source Oral, resp. rate 19, height 5\' 3"  (1.6 m), weight 63 kg, SpO2 100 %.  Sitting upright on bed in no apparent discomfort Heart rate and rhythm are regular, radial pulse 2+ Breathing is regular and unlabored, lungs are clear to auscultation bilaterally Abdomen is diffusely tender to palpation but soft and nondistended Skin is warm and dry No gross neurologic deficits Pleasant, mood and affect are concordant  Pertinent studies  and procedures:   Latest Reference Range & Units 02/26/22 07:03  Sodium 135 - 145 mmol/L 135  Potassium 3.5 - 5.1 mmol/L 3.7  Chloride 98 - 111 mmol/L 100  CO2 22 - 32 mmol/L 27  Glucose 70 - 99 mg/dL 102 (H)  BUN 8 - 23 mg/dL 14  Creatinine 0.44 - 1.00 mg/dL 5.63 (H)  Calcium 8.9 - 10.3 mg/dL 8.6 (L)  Anion gap 5 - 15  8  Phosphorus 2.5 - 4.6 mg/dL 3.6  Albumin 3.5 - 5.0 g/dL 2.6 (L)  GFR, Estimated >60 mL/min 8 (L)  (H): Data is abnormally high (L): Data is abnormally low   Latest Reference Range & Units 02/25/22 12:52  Lipase 11 - 51 U/L 62 (H)  (H): Data is abnormally high   Latest Reference Range & Units 02/26/22 07:03  WBC 4.0 - 10.5 K/uL 5.1  RBC 3.87 - 5.11 MIL/uL 3.43 (L)  Hemoglobin 12.0 - 15.0 g/dL 10.5 (L)  HCT 36.0 - 46.0 % 35.3 (L)  MCV 80.0 - 100.0 fL 102.9 (H)  MCH 26.0 - 34.0 pg 30.6  MCHC 30.0 - 36.0 g/dL 29.7 (L)  RDW 11.5 - 15.5 % 16.1 (H)  Platelets 150 - 400 K/uL 219  nRBC 0.0 - 0.2 % 0.0  (L): Data is abnormally low (H): Data is abnormally high   Latest Reference Range & Units 02/25/22 12:52  Prothrombin Time 11.4 - 15.2 seconds 15.2  INR 0.8 - 1.2  1.2    Latest Reference Range & Units 02/26/22 07:03  Hepatitis B Surface Ag NON REACTIVE  NON REACTIVE   CT abdomen and pelvis IMPRESSION: 1. Small volume abdominal ascites along with mesenteric edema and diffuse body wall edema. 2. Diffuse colonic diverticulosis without findings for acute diverticulitis. 3. Very small bilateral pleural effusions with overlying bibasilar atelectasis. 4. Stable cardiac enlargement. 5. Advanced atherosclerotic calcifications involving the aorta and branch vessels. 6. Stable large vascular clips in the stomach likely related to prior GI bleed. 7. Severe chronic changes involving the lumbar spine.  Discharge Instructions:   Discharge Instructions      Ms. Jodi Bell  You were admitted for abdominal discomfort, nausea, vomiting, and treated for  gastroenteritis. We are discharging you home now that you are doing better. To help assist you on your road to recovery, I have written the following recommendations:   Keep all of your appointments with nephrology, GI, and your PCP.  Wash your hands frequently, especially after using the restroom, to prevent spreading illness to others.  Call your doctor or return to the hospital for signs of worsening illness, including but not limited to: nausea and vomiting that worsens or does not improve after several days, severe abdominal pain, blood in stool or vomit, fevers, or inability to eat or drink.  It was a privilege to be a part of your hospital care team, and I hope you feel better as a result of your stay.  All the best, Nani Gasser, MD     Signed: Nani Gasser MD 02/26/2022, 3:51 PM   Pager: [phone]

## 2022-02-26 NOTE — Plan of Care (Signed)

## 2022-02-26 NOTE — Progress Notes (Signed)
Contacted by attending and nephrologist regarding pt being stable for d/c today and missed out-pt HD appt. Pt receives out-pt HD at [facility] on MWF. Contacted clinic and spoke to Amy, Agricultural consultant. Clinic can treat pt tomorrow to make-up missed treatment for today. Pt will need to arrive at 6:00 am for 6:20 am chair time. Spoke to pt via phone. Pt made aware of above info and agreeable to plan. Pt drives self to HD appts. Update provided to team. Appt for tomorrow added to AVS. H and P faxed to clinic per their request for continuation of care. Will fax d/c summary once completed. Kim at clinic aware pt should d/c later today and to resume tomorrow.   Melven Sartorius Renal Navigator [phone]

## 2022-02-26 NOTE — Progress Notes (Signed)
Occupational Therapy Evaluation Patient Details Name: Jodi Bell MRN: [id number] DOB: 09-12-1960 Today's Date: 02/26/2022   History of Present Illness Pt is a 62 year old woman admitted on 02/25/22 with N/V and abdominal pain x 2 weeks. PMH: ESRD on HD, GIB, HTN, asthma, CAD s/p CABG, OSA, pt uses 2-3L O2 at night.   Clinical Impression   Pt is functioning modified independently in ADLs and mobility with RW. No OT needs.      Recommendations for follow up therapy are one component of a multi-disciplinary discharge planning process, led by the attending physician.  Recommendations may be updated based on patient status, additional functional criteria and insurance authorization.   Follow Up Recommendations  No OT follow up     Assistance Recommended at Discharge Intermittent Supervision/Assistance  Patient can return home with the following Assistance with cooking/housework    Functional Status Assessment  Patient has had a recent decline in their functional status and demonstrates the ability to make significant improvements in function in a reasonable and predictable amount of time.  Equipment Recommendations  None recommended by OT    Recommendations for Other Services       Precautions / Restrictions Precautions Precautions: Fall      Mobility Bed Mobility Overal bed mobility: Modified Independent                  Transfers Overall transfer level: Modified independent Equipment used: Rolling walker (2 wheels)                      Balance Overall balance assessment: Mild deficits observed, not formally tested                                         ADL either performed or assessed with clinical judgement   ADL Overall ADL's : Modified independent;At baseline                                             Vision Ability to See in Adequate Light: 0 Adequate Patient Visual Report: No change from baseline        Perception     Praxis      Pertinent Vitals/Pain Pain Assessment Pain Assessment: Faces Faces Pain Scale: Hurts a little bit Pain Location: abdomen Pain Descriptors / Indicators: Sore Pain Intervention(s): Monitored during session     Hand Dominance Right   Extremity/Trunk Assessment Upper Extremity Assessment Upper Extremity Assessment: Overall WFL for tasks assessed   Lower Extremity Assessment Lower Extremity Assessment: Defer to PT evaluation   Cervical / Trunk Assessment Cervical / Trunk Assessment: Normal   Communication Communication Communication: No difficulties   Cognition Arousal/Alertness: Awake/alert Behavior During Therapy: WFL for tasks assessed/performed Overall Cognitive Status: Within Functional Limits for tasks assessed                                       General Comments       Exercises     Shoulder Instructions      Home Living Family/patient expects to be discharged to:: Private residence Living Arrangements: Children Available Help at Discharge: Family;Available PRN/intermittently Type of Home: House Home Access:  Level entry     Home Layout: Multi-level;Able to live on main level with bedroom/bathroom Alternate Level Stairs-Number of Steps: 13 to kitchen Alternate Level Stairs-Rails: Left Bathroom Shower/Tub: Tub/shower unit   Bathroom Toilet: Standard     Home Equipment: Cane - single Barista (2 wheels);Rollator (4 wheels);Shower seat;Toilet riser;Grab bars - tub/shower;Hand held shower head   Additional Comments: pt is alone 2 hours a day      Prior Functioning/Environment Prior Level of Function : Independent/Modified Independent;Driving             Mobility Comments: Pt uses cane for household ambulation, Rollator for community ambulation. ADLs Comments: pt sits to shower, mod I for self care, assisted for IADLs, pt drives to HD        OT Problem List:        OT  Treatment/Interventions:      OT Goals(Current goals can be found in the care plan section)    OT Frequency:      Co-evaluation              AM-PAC OT "6 Clicks" Daily Activity     Outcome Measure Help from another person eating meals?: None Help from another person taking care of personal grooming?: None Help from another person toileting, which includes using toliet, bedpan, or urinal?: None Help from another person bathing (including washing, rinsing, drying)?: None Help from another person to put on and taking off regular upper body clothing?: None Help from another person to put on and taking off regular lower body clothing?: None 6 Click Score: 24   End of Session Equipment Utilized During Treatment: Rolling walker (2 wheels)  Activity Tolerance: Patient tolerated treatment well Patient left: in chair;Other (comment) (MD in room)  OT Visit Diagnosis: Pain                Time: 1023-1035 OT Time Calculation (min): 12 min Charges:  OT General Charges $OT Visit: 1 Visit OT Evaluation $OT Eval Low Complexity: [name], OTR/L Acute Rehabilitation Services Office: [phone]   Malka So 02/26/2022, 11:25 AM

## 2022-02-26 NOTE — Progress Notes (Signed)
Clinical update:  Patient had rebound hypertension due to not getting her clonidine (after she was vomiting). Resumed po medications today, however, BP remains very elevated. SBP was 240 prior to her antihypertensives being given, and then came down to 210. The patient was advised to stay in the hospital for blood pressure reduction and control, however, she is adamant about going home. We had a shared decision making conversation and since the patient is asymptomatic, agreed she can go home with strict return precautions (advised her if she develops a headache, vision changes, chest pain, etc to return to the ED). In the mean time, the patient is going to resume her home antihypertensive regimen and is going to have close follow up with her PCP, Dr. Rozetta Nunnery.    Buddy Duty, DO Internal Medicine Resident, PGY-2

## 2022-02-26 NOTE — Evaluation (Signed)
Physical Therapy Evaluation Patient Details Name: ANJANA CHEEK MRN: [id number] DOB: 07/31/1960 Today's Date: 02/26/2022  History of Present Illness  Pt is a 62 year old woman admitted on 02/25/22 with N/V and abdominal pain x 2 weeks. PMH: ESRD on HD, GIB, HTN, asthma, CAD s/p CABG, OSA, pt uses 2-3L O2 at night.  Clinical Impression  Pt admitted with above diagnosis. At baseline, pt independent with use of AD in community only.  She has been mobilizing in room mod I with RW.  During therapy she demonstrated good safety awareness , ambulated 120' without AD, and demonstrated safe transfers.  Pt at or near baseline and requires no further skilled PT.        Recommendations for follow up therapy are one component of a multi-disciplinary discharge planning process, led by the attending physician.  Recommendations may be updated based on patient status, additional functional criteria and insurance authorization.  Follow Up Recommendations No PT follow up      Assistance Recommended at Discharge None  Patient can return home with the following       Equipment Recommendations None recommended by PT  Recommendations for Other Services       Functional Status Assessment Patient has not had a recent decline in their functional status     Precautions / Restrictions Precautions Precautions: Fall      Mobility  Bed Mobility Overal bed mobility: Modified Independent                  Transfers Overall transfer level: Modified independent Equipment used: Rolling walker (2 wheels)                    Ambulation/Gait Ambulation/Gait assistance: Modified independent (Device/Increase time), Supervision Gait Distance (Feet): 120 Feet Assistive device: None Gait Pattern/deviations: Step-through pattern       General Gait Details: Pt has been mobilizing in room with RW at mod I level.  She demonstrated hallway ambulation to therapist without AD -caution speed but  steady  Stairs            Wheelchair Mobility    Modified Rankin (Stroke Patients Only)       Balance Overall balance assessment: Modified Independent Sitting-balance support: No upper extremity supported Sitting balance-Leahy Scale: Normal     Standing balance support: No upper extremity supported Standing balance-Leahy Scale: Good                               Pertinent Vitals/Pain Pain Assessment Pain Assessment: No/denies pain    Home Living Family/patient expects to be discharged to:: Private residence Living Arrangements: Children Available Help at Discharge: Family;Available PRN/intermittently Type of Home: House Home Access: Level entry     Alternate Level Stairs-Number of Steps: 13 to kitchen Home Layout: Multi-level;Able to live on main level with bedroom/bathroom Home Equipment: Cane - single point;Rolling Walker (2 wheels);Rollator (4 wheels);Shower seat;Toilet riser;Grab bars - tub/shower;Hand held shower head Additional Comments: pt is alone 2 hours a day    Prior Function Prior Level of Function : Independent/Modified Independent;Driving             Mobility Comments: Pt uses cane for outside; no AD in home, Rollator for community ambulation. Does report 1 remote fall in community but since has used rollator ADLs Comments: pt sits to shower, mod I for self care, assisted for IADLs, pt drives to HD     Hand  Dominance   Dominant Hand: Right    Extremity/Trunk Assessment   Upper Extremity Assessment Upper Extremity Assessment: Overall WFL for tasks assessed    Lower Extremity Assessment Lower Extremity Assessment: Overall WFL for tasks assessed    Cervical / Trunk Assessment Cervical / Trunk Assessment: Normal  Communication   Communication: No difficulties  Cognition Arousal/Alertness: Awake/alert Behavior During Therapy: WFL for tasks assessed/performed Overall Cognitive Status: Within Functional Limits for tasks  assessed                                          General Comments      Exercises     Assessment/Plan    PT Assessment Patient does not need any further PT services  PT Problem List         PT Treatment Interventions      PT Goals (Current goals can be found in the Care Plan section)  Acute Rehab PT Goals Patient Stated Goal: return home PT Goal Formulation: All assessment and education complete, DC therapy    Frequency       Co-evaluation               AM-PAC PT "6 Clicks" Mobility  Outcome Measure Help needed turning from your back to your side while in a flat bed without using bedrails?: None Help needed moving from lying on your back to sitting on the side of a flat bed without using bedrails?: None Help needed moving to and from a bed to a chair (including a wheelchair)?: None Help needed standing up from a chair using your arms (e.g., wheelchair or bedside chair)?: None Help needed to walk in hospital room?: None Help needed climbing 3-5 steps with a railing? : A Little 6 Click Score: 23    End of Session   Activity Tolerance: Patient tolerated treatment well Patient left: in bed;with call bell/phone within reach Nurse Communication: Mobility status PT Visit Diagnosis: Other abnormalities of gait and mobility (R26.89)    Time: 3419-3790 PT Time Calculation (min) (ACUTE ONLY): 12 min   Charges:   PT Evaluation $PT Eval Low Complexity: 1 Low          Janete Quilling, PT Acute Rehab [facility] [phone]   Karlton Lemon 02/26/2022, 1:21 PM

## 2022-02-27 ENCOUNTER — Ambulatory Visit (HOSPITAL_COMMUNITY): Payer: Medicare PPO

## 2022-02-27 LAB — HEPATITIS B SURFACE ANTIBODY, QUANTITATIVE: Hep B S AB Quant (Post): 9.6 m[IU]/mL — ABNORMAL LOW (ref >9.9)

## 2022-02-27 NOTE — Progress Notes (Signed)
Late Entry Note:  D/C summary faxed to clinic this morning for continuation of care.   Mychael Soots Renal Navigator [phone]  

## 2022-03-18 ENCOUNTER — Other Ambulatory Visit: Payer: Self-pay

## 2022-03-18 ENCOUNTER — Encounter (HOSPITAL_BASED_OUTPATIENT_CLINIC_OR_DEPARTMENT_OTHER): Payer: Self-pay

## 2022-03-18 ENCOUNTER — Observation Stay (HOSPITAL_BASED_OUTPATIENT_CLINIC_OR_DEPARTMENT_OTHER)
Admission: EM | Admit: 2022-03-18 | Discharge: 2022-03-20 | Disposition: A | Discharge status: Home / Self Care | Payer: Medicare PPO | Attending: Internal Medicine | Admitting: Internal Medicine

## 2022-03-18 DIAGNOSIS — E1122 Type 2 diabetes mellitus with diabetic chronic kidney disease: Secondary | ICD-10-CM | POA: Diagnosis not present

## 2022-03-18 DIAGNOSIS — I132 Hypertensive heart and chronic kidney disease with heart failure and with stage 5 chronic kidney disease, or end stage renal disease: Secondary | ICD-10-CM | POA: Insufficient documentation

## 2022-03-18 DIAGNOSIS — I5032 Chronic diastolic (congestive) heart failure: Secondary | ICD-10-CM | POA: Diagnosis not present

## 2022-03-18 DIAGNOSIS — N186 End stage renal disease: Secondary | ICD-10-CM | POA: Diagnosis not present

## 2022-03-18 DIAGNOSIS — E119 Type 2 diabetes mellitus without complications: Secondary | ICD-10-CM

## 2022-03-18 DIAGNOSIS — I5033 Acute on chronic diastolic (congestive) heart failure: Secondary | ICD-10-CM | POA: Diagnosis present

## 2022-03-18 DIAGNOSIS — J449 Chronic obstructive pulmonary disease, unspecified: Secondary | ICD-10-CM | POA: Diagnosis not present

## 2022-03-18 DIAGNOSIS — K922 Gastrointestinal hemorrhage, unspecified: Secondary | ICD-10-CM | POA: Diagnosis not present

## 2022-03-18 DIAGNOSIS — J438 Other emphysema: Secondary | ICD-10-CM

## 2022-03-18 DIAGNOSIS — J9611 Chronic respiratory failure with hypoxia: Secondary | ICD-10-CM | POA: Diagnosis not present

## 2022-03-18 DIAGNOSIS — L4050 Arthropathic psoriasis, unspecified: Secondary | ICD-10-CM | POA: Diagnosis present

## 2022-03-18 DIAGNOSIS — J45909 Unspecified asthma, uncomplicated: Secondary | ICD-10-CM | POA: Insufficient documentation

## 2022-03-18 DIAGNOSIS — Z87891 Personal history of nicotine dependence: Secondary | ICD-10-CM | POA: Diagnosis not present

## 2022-03-18 DIAGNOSIS — E875 Hyperkalemia: Secondary | ICD-10-CM | POA: Diagnosis not present

## 2022-03-18 DIAGNOSIS — K219 Gastro-esophageal reflux disease without esophagitis: Secondary | ICD-10-CM | POA: Diagnosis present

## 2022-03-18 DIAGNOSIS — I10 Essential (primary) hypertension: Secondary | ICD-10-CM | POA: Diagnosis present

## 2022-03-18 DIAGNOSIS — Z992 Dependence on renal dialysis: Secondary | ICD-10-CM | POA: Diagnosis not present

## 2022-03-18 DIAGNOSIS — Z96652 Presence of left artificial knee joint: Secondary | ICD-10-CM | POA: Insufficient documentation

## 2022-03-18 DIAGNOSIS — Z79899 Other long term (current) drug therapy: Secondary | ICD-10-CM | POA: Diagnosis not present

## 2022-03-18 DIAGNOSIS — G4733 Obstructive sleep apnea (adult) (pediatric): Secondary | ICD-10-CM

## 2022-03-18 DIAGNOSIS — D649 Anemia, unspecified: Secondary | ICD-10-CM | POA: Diagnosis present

## 2022-03-18 DIAGNOSIS — K625 Hemorrhage of anus and rectum: Secondary | ICD-10-CM

## 2022-03-18 DIAGNOSIS — I4891 Unspecified atrial fibrillation: Secondary | ICD-10-CM | POA: Diagnosis present

## 2022-03-18 DIAGNOSIS — I160 Hypertensive urgency: Secondary | ICD-10-CM | POA: Insufficient documentation

## 2022-03-18 DIAGNOSIS — I480 Paroxysmal atrial fibrillation: Secondary | ICD-10-CM | POA: Diagnosis not present

## 2022-03-18 DIAGNOSIS — I25118 Atherosclerotic heart disease of native coronary artery with other forms of angina pectoris: Secondary | ICD-10-CM | POA: Insufficient documentation

## 2022-03-18 DIAGNOSIS — I25709 Atherosclerosis of coronary artery bypass graft(s), unspecified, with unspecified angina pectoris: Secondary | ICD-10-CM | POA: Diagnosis present

## 2022-03-18 LAB — TROPONIN I (HIGH SENSITIVITY)
Troponin I (High Sensitivity): 24 ng/L — ABNORMAL HIGH (ref <18)
Troponin I (High Sensitivity): 21 ng/L — ABNORMAL HIGH (ref <18)

## 2022-03-18 LAB — CBC WITH DIFFERENTIAL/PLATELET
Abs Immature Granulocytes: 0.01 10*3/uL (ref 0.00–0.07)
Basophils Absolute: 0 10*3/uL (ref 0.0–0.1)
Basophils Relative: 1 %
Eosinophils Absolute: 0.4 10*3/uL (ref 0.0–0.5)
Eosinophils Relative: 11 %
HCT: 41.1 % (ref 36.0–46.0)
Hemoglobin: 12.8 g/dL (ref 12.0–15.0)
Immature Granulocytes: 0 %
Lymphocytes Relative: 16 %
Lymphs Abs: 0.6 10*3/uL — ABNORMAL LOW (ref 0.7–4.0)
MCH: 31.3 pg (ref 26.0–34.0)
MCHC: 31.1 g/dL (ref 30.0–36.0)
MCV: 100.5 fL — ABNORMAL HIGH (ref 80.0–100.0)
Monocytes Absolute: 0.3 10*3/uL (ref 0.1–1.0)
Monocytes Relative: 7 %
Neutro Abs: 2.3 10*3/uL (ref 1.7–7.7)
Neutrophils Relative %: 65 %
Platelets: 156 10*3/uL (ref 150–400)
RBC: 4.09 MIL/uL (ref 3.87–5.11)
RDW: 18.7 % — ABNORMAL HIGH (ref 11.5–15.5)
WBC: 3.6 10*3/uL — ABNORMAL LOW (ref 4.0–10.5)
nRBC: 0 % (ref 0.0–0.2)

## 2022-03-18 LAB — CBC
HCT: 38 % (ref 36.0–46.0)
Hemoglobin: 12.1 g/dL (ref 12.0–15.0)
MCH: 31.5 pg (ref 26.0–34.0)
MCHC: 31.8 g/dL (ref 30.0–36.0)
MCV: 99 fL (ref 80.0–100.0)
Platelets: 162 10*3/uL (ref 150–400)
RBC: 3.84 MIL/uL — ABNORMAL LOW (ref 3.87–5.11)
RDW: 18.9 % — ABNORMAL HIGH (ref 11.5–15.5)
WBC: 3.9 10*3/uL — ABNORMAL LOW (ref 4.0–10.5)
nRBC: 0 % (ref 0.0–0.2)

## 2022-03-18 LAB — COMPREHENSIVE METABOLIC PANEL
ALT: 8 U/L (ref 0–44)
AST: 17 U/L (ref 15–41)
Albumin: 2.9 g/dL — ABNORMAL LOW (ref 3.5–5.0)
Alkaline Phosphatase: 114 U/L (ref 38–126)
Anion gap: 13 (ref 5–15)
BUN: 31 mg/dL — ABNORMAL HIGH (ref 8–23)
CO2: 22 mmol/L (ref 22–32)
Calcium: 9.1 mg/dL (ref 8.9–10.3)
Chloride: 95 mmol/L — ABNORMAL LOW (ref 98–111)
Creatinine, Ser: 5.7 mg/dL — ABNORMAL HIGH (ref 0.44–1.00)
GFR, Estimated: 8 mL/min — ABNORMAL LOW (ref >60)
Glucose, Bld: 74 mg/dL (ref 70–99)
Potassium: 5.5 mmol/L — ABNORMAL HIGH (ref 3.5–5.1)
Sodium: 130 mmol/L — ABNORMAL LOW (ref 135–145)
Total Bilirubin: 0.8 mg/dL (ref 0.3–1.2)
Total Protein: 5.8 g/dL — ABNORMAL LOW (ref 6.5–8.1)

## 2022-03-18 LAB — GLUCOSE, CAPILLARY: Glucose-Capillary: 134 mg/dL — ABNORMAL HIGH (ref 70–99)

## 2022-03-18 LAB — BASIC METABOLIC PANEL
Anion gap: 10 (ref 5–15)
BUN: 30 mg/dL — ABNORMAL HIGH (ref 8–23)
CO2: 22 mmol/L (ref 22–32)
Calcium: 8.7 mg/dL — ABNORMAL LOW (ref 8.9–10.3)
Chloride: 98 mmol/L (ref 98–111)
Creatinine, Ser: 5.02 mg/dL — ABNORMAL HIGH (ref 0.44–1.00)
GFR, Estimated: 9 mL/min — ABNORMAL LOW (ref >60)
Glucose, Bld: 94 mg/dL (ref 70–99)
Potassium: 5.8 mmol/L — ABNORMAL HIGH (ref 3.5–5.1)
Sodium: 130 mmol/L — ABNORMAL LOW (ref 135–145)

## 2022-03-18 LAB — PHOSPHORUS: Phosphorus: 5.2 mg/dL — ABNORMAL HIGH (ref 2.5–4.6)

## 2022-03-18 LAB — TYPE AND SCREEN
ABO/RH(D): AB POS
Antibody Screen: NEGATIVE

## 2022-03-18 LAB — TSH: TSH: 3.789 u[IU]/mL (ref 0.350–4.500)

## 2022-03-18 LAB — OCCULT BLOOD X 1 CARD TO LAB, STOOL: Fecal Occult Bld: POSITIVE — AB

## 2022-03-18 LAB — HEMOGLOBIN A1C
Hgb A1c MFr Bld: 4.8 % (ref 4.8–5.6)
Mean Plasma Glucose: 91.06 mg/dL

## 2022-03-18 LAB — CK: Total CK: 58 U/L (ref 38–234)

## 2022-03-18 LAB — MAGNESIUM: Magnesium: 1.9 mg/dL (ref 1.7–2.4)

## 2022-03-18 MED ORDER — HYDRALAZINE HCL 20 MG/ML IJ SOLN
10.0000 mg | INTRAMUSCULAR | Status: DC | PRN
  Administered 2022-03-19 – 2022-03-20 (×3): 10 mg via INTRAVENOUS
  Filled 2022-03-18 (×3): qty 1

## 2022-03-18 MED ORDER — UMECLIDINIUM BROMIDE 62.5 MCG/ACT IN AEPB
1.0000 | INHALATION_SPRAY | Freq: Every day | RESPIRATORY_TRACT | Status: DC
  Filled 2022-03-18 (×2): qty 7

## 2022-03-18 MED ORDER — SODIUM CHLORIDE 0.9% FLUSH
3.0000 mL | Freq: Two times a day (BID) | INTRAVENOUS | Status: DC
  Administered 2022-03-18 – 2022-03-19 (×3): 3 mL via INTRAVENOUS

## 2022-03-18 MED ORDER — LORAZEPAM 1 MG PO TABS
1.0000 mg | ORAL_TABLET | Freq: Three times a day (TID) | ORAL | Status: DC | PRN
  Administered 2022-03-18 – 2022-03-19 (×2): 1 mg via ORAL
  Filled 2022-03-18 (×2): qty 1

## 2022-03-18 MED ORDER — INSULIN ASPART 100 UNIT/ML IJ SOLN: 0.0000 [IU] | INTRAMUSCULAR | Status: DC

## 2022-03-18 MED ORDER — HYDRALAZINE HCL 50 MG PO TABS
100.0000 mg | ORAL_TABLET | Freq: Three times a day (TID) | ORAL | Status: DC
  Administered 2022-03-18: 100 mg via ORAL
  Filled 2022-03-18: qty 2

## 2022-03-18 MED ORDER — ARFORMOTEROL TARTRATE 15 MCG/2ML IN NEBU
15.0000 ug | INHALATION_SOLUTION | Freq: Two times a day (BID) | RESPIRATORY_TRACT | Status: DC
  Administered 2022-03-18 – 2022-03-20 (×3): 15 ug via RESPIRATORY_TRACT
  Filled 2022-03-18 (×2): qty 2

## 2022-03-18 MED ORDER — ACETAMINOPHEN 325 MG PO TABS
650.0000 mg | ORAL_TABLET | Freq: Four times a day (QID) | ORAL | Status: DC | PRN
  Administered 2022-03-19 (×2): 650 mg via ORAL
  Filled 2022-03-18 (×2): qty 2

## 2022-03-18 MED ORDER — ACETAMINOPHEN 650 MG RE SUPP: 650.0000 mg | Freq: Four times a day (QID) | RECTAL | Status: DC | PRN

## 2022-03-18 MED ORDER — PANTOPRAZOLE SODIUM 40 MG IV SOLR
40.0000 mg | Freq: Two times a day (BID) | INTRAVENOUS | Status: DC
  Administered 2022-03-18 – 2022-03-20 (×4): 40 mg via INTRAVENOUS
  Filled 2022-03-18 (×4): qty 10

## 2022-03-18 MED ORDER — CLONIDINE HCL 0.2 MG PO TABS
0.3000 mg | ORAL_TABLET | Freq: Three times a day (TID) | ORAL | Status: DC
  Administered 2022-03-18: 0.3 mg via ORAL
  Filled 2022-03-18: qty 1

## 2022-03-18 MED ORDER — ALBUTEROL SULFATE (2.5 MG/3ML) 0.083% IN NEBU: 2.5000 mg | INHALATION_SOLUTION | Freq: Four times a day (QID) | RESPIRATORY_TRACT | Status: DC | PRN

## 2022-03-18 MED ORDER — SODIUM CHLORIDE 0.9% FLUSH: 3.0000 mL | INTRAVENOUS | Status: DC | PRN

## 2022-03-18 MED ORDER — CHLORHEXIDINE GLUCONATE CLOTH 2 % EX PADS
6.0000 | MEDICATED_PAD | Freq: Every day | CUTANEOUS | Status: DC
  Administered 2022-03-19 – 2022-03-20 (×2): 6 via TOPICAL

## 2022-03-18 MED ORDER — CINACALCET HCL 30 MG PO TABS
60.0000 mg | ORAL_TABLET | Freq: Every day | ORAL | Status: DC
  Filled 2022-03-18: qty 2

## 2022-03-18 MED ORDER — ALUM & MAG HYDROXIDE-SIMETH 200-200-20 MG/5ML PO SUSP
30.0000 mL | Freq: Once | ORAL | Status: AC
  Administered 2022-03-18: 30 mL via ORAL
  Filled 2022-03-18: qty 30

## 2022-03-18 MED ORDER — CARVEDILOL 25 MG PO TABS: 25.0000 mg | ORAL_TABLET | Freq: Two times a day (BID) | ORAL | Status: DC

## 2022-03-18 MED ORDER — CALCIUM ACETATE (PHOS BINDER) 667 MG PO CAPS
1334.0000 mg | ORAL_CAPSULE | Freq: Three times a day (TID) | ORAL | Status: DC
  Filled 2022-03-18: qty 2

## 2022-03-18 MED ORDER — SODIUM ZIRCONIUM CYCLOSILICATE 5 G PO PACK
5.0000 g | PACK | Freq: Once | ORAL | Status: AC
  Administered 2022-03-18: 5 g via ORAL
  Filled 2022-03-18: qty 1

## 2022-03-18 MED ORDER — HYDROCODONE-ACETAMINOPHEN 5-325 MG PO TABS: 1.0000 | ORAL_TABLET | ORAL | Status: DC | PRN

## 2022-03-18 MED ORDER — PREDNISONE 5 MG PO TABS
5.0000 mg | ORAL_TABLET | Freq: Every day | ORAL | Status: DC
  Administered 2022-03-19 – 2022-03-20 (×2): 5 mg via ORAL
  Filled 2022-03-18 (×2): qty 1

## 2022-03-18 MED ORDER — ISOSORBIDE MONONITRATE ER 60 MG PO TB24
60.0000 mg | ORAL_TABLET | Freq: Every day | ORAL | Status: DC
  Administered 2022-03-18 – 2022-03-20 (×3): 60 mg via ORAL
  Filled 2022-03-18 (×3): qty 1

## 2022-03-18 MED ORDER — MONTELUKAST SODIUM 10 MG PO TABS
10.0000 mg | ORAL_TABLET | Freq: Every day | ORAL | Status: DC
  Administered 2022-03-18 – 2022-03-19 (×2): 10 mg via ORAL
  Filled 2022-03-18 (×2): qty 1

## 2022-03-18 MED ORDER — ALLOPURINOL 100 MG PO TABS
100.0000 mg | ORAL_TABLET | Freq: Every evening | ORAL | Status: DC
  Administered 2022-03-18 – 2022-03-19 (×2): 100 mg via ORAL
  Filled 2022-03-18 (×2): qty 1

## 2022-03-18 MED ORDER — HYDRALAZINE HCL 25 MG PO TABS
100.0000 mg | ORAL_TABLET | Freq: Once | ORAL | Status: AC
  Administered 2022-03-18: 100 mg via ORAL
  Filled 2022-03-18: qty 4

## 2022-03-18 MED ORDER — FOLIC ACID 1 MG PO TABS
0.5000 mg | ORAL_TABLET | Freq: Every day | ORAL | Status: DC
  Administered 2022-03-18 – 2022-03-20 (×3): 0.5 mg via ORAL
  Filled 2022-03-18 (×3): qty 1

## 2022-03-18 MED ORDER — NIFEDIPINE ER OSMOTIC RELEASE 60 MG PO TB24
60.0000 mg | ORAL_TABLET | Freq: Two times a day (BID) | ORAL | Status: DC
  Administered 2022-03-18: 60 mg via ORAL
  Filled 2022-03-18 (×3): qty 1

## 2022-03-18 MED ORDER — SODIUM ZIRCONIUM CYCLOSILICATE 10 G PO PACK
10.0000 g | PACK | Freq: Once | ORAL | Status: AC
  Administered 2022-03-18: 10 g via ORAL
  Filled 2022-03-18: qty 1

## 2022-03-18 MED ORDER — SODIUM CHLORIDE 0.9 % IV SOLN: 250.0000 mL | INTRAVENOUS | Status: DC | PRN

## 2022-03-18 NOTE — Assessment & Plan Note (Signed)
Isolated not a candidate for anticoagulation given GI bleeding

## 2022-03-18 NOTE — ED Notes (Signed)
Pt states that she had mac and cheese yesterday and soda after dialysis yesterday when  I told her her K was 5.8

## 2022-03-18 NOTE — Subjective & Objective (Signed)
2-day history of blood per stools.  History of GI bleeding in the past.  Associate some rectal pain.  No abdominal pain She gets dialyzed Monday Wednesday Friday last dialysis on Monday and has full treatment.  Patient is on oxygen baseline 2 L Known history of severe hypertension she states that first 2 days ago she had 1 episode of bright red blood per rectum but then at night she had 5 more.  The rectal pain now has resolved.  No associated nausea or vomiting.  She has baseline anemia and she has been a little bit of fatigue a little more short of breath than baseline but not needing to increase her oxygen requirement Her doctor had to switch her blood pressure medications to carvedilol because her blood pressure was not well-controlled Endorses mild right-sided chest pain

## 2022-03-18 NOTE — Assessment & Plan Note (Signed)
Chronic stable avoid fluid overload 

## 2022-03-18 NOTE — Progress Notes (Signed)
Plan of Care Note for accepted transfer   Patient: Jodi Bell MRN: [id number]   DOA: 03/18/2022  Facility requesting transfer: [facility] Requesting Provider: Tamera Punt Reason for transfer: Rectal bleeding Facility course: Patient with h/o ESRD on HD, COPD, DM, HTN, and OSA presenting with rectal bleeding.  5 episodes of BRBPR overnight.  HD was yesterday.  No AC.  No further BMs in the ER, Hgb 12.  K+ 5.8 despite HD yesterday, given Lokelma and no EKG changes.  Will observe overnight at [facility].   Plan of care: The patient is accepted for observation to Telemetry unit, at [facility].   Author: Karmen Bongo, MD 03/18/2022  Check [email or web address] for on-call coverage.  Nursing staff, Please call [place] number on Amion as soon as patient's arrival, so appropriate admitting provider can evaluate the pt.

## 2022-03-18 NOTE — Assessment & Plan Note (Signed)
Chronic stable continue home medications patient on chronic on oxygen 2 L will continue

## 2022-03-18 NOTE — ED Provider Notes (Signed)
[place] EM[facility]ENC[facility]PARTMENT AT [place] Provider Note   CSN: [id number] Arrival date & time: 03/18/22  0820     History  Chief Complaint  Patient presents with   Rectal Bleeding    MEGHA AGNES is a 62 y.o. female.  Patient is a 62 year old female who presents with blood in her stools.  She has a history of hypertension, COPD, end-stage renal disease on dialysis, coronary artery disease, aortic stenosis and prior GI bleeding.  She states that 2 days ago she had 1 episode of some blood in her stool but during the night and this morning she has had at least 5 episodes of bright red blood per rectum.  She denies any abdominal pain.  She said initially she had a little bit of sharp pain in her rectum but that resolved.  No current rectal pain or abdominal pain.  No nausea or vomiting.  She has some baseline fatigue due to what she calls baseline anemia but she says it is a little bit worse and she has a little bit more shortness of breath.  She is oxygen dependent and has not changed her baseline oxygenation.  She has been having some intermittent pains in the right side of her chest.  She recently changed her blood pressure medicine to carvedilol from another beta-blocker because her blood pressure was not well-controlled.       Home Medications Prior to Admission medications   Medication Sig Start Date End Date Taking? Authorizing Provider  acetaminophen (TYLENOL) 500 MG tablet Take 1,000 mg by mouth daily as needed (pain).    [provider]  albuterol (PROVENTIL) (2.5 MG/3ML) 0.083% nebulizer solution Take 3 mLs (2.5 mg total) by nebulization every 6 (six) hours as needed for wheezing or shortness of breath. 07/10/21   Hunsucker, Bonna Gains, MD  allopurinol (ZYLOPRIM) 100 MG tablet Take 100 mg by mouth every evening.    [provider]  calcium acetate (PHOSLO) 667 MG capsule Take 1,334 mg by mouth 3 (three) times daily with meals. 05/13/21   [provider]  carboxymethylcellulose 1 % ophthalmic solution Place 2 drops into both eyes 2 (two) times daily as needed (dry eyes).    [provider]  cetirizine (ZYRTEC) 10 MG tablet Take 10 mg by mouth daily as needed for allergies.    [provider]  cholecalciferol (VITAMIN D) 25 MCG tablet Take 1 tablet (1,000 Units total) by mouth every evening. 10/04/20   Gold, Wilder Glade, PA-C  cholecalciferol (VITAMIN D3) 10 MCG (400 UNIT) TABS tablet Take 800 Units by mouth daily.    [provider]  cinacalcet (SENSIPAR) 60 MG tablet Take 60 mg by mouth daily.    [provider]  cloNIDine (CATAPRES) 0.3 MG tablet Take 0.3 mg by mouth 3 (three) times daily.    [provider]  cyclobenzaprine (FLEXERIL) 10 MG tablet Take 10 mg by mouth 2 (two) times daily as needed for muscle spasms.    [provider]  Darbepoetin Alfa (ARANESP) 200 MCG/0.4ML SOSY injection Inject 0.4 mLs (200 mcg total) into the vein every Wednesday with hemodialysis. 10/10/20   Jadene Pierini E, PA-C  diclofenac Sodium (VOLTAREN) 1 % GEL Apply 2 g topically daily as needed (pain).    [provider]  docusate sodium (COLACE) 100 MG capsule Take 100-200 mg by mouth daily as needed for mild constipation.    [provider]  ferrous sulfate 325 (65 FE) MG tablet Take  325 mg by mouth See admin instructions. Take 325 mg by mouth three times a week Tues, Thur, Sat    [provider]  folic acid (FOLVITE) 096 MCG tablet Take 400 mcg by mouth daily.    [provider]  gabapentin (NEURONTIN) 300 MG capsule Take 1 capsule (300 mg total) by mouth at bedtime as needed (Neuropathic pain). Patient taking differently: Take 300 mg by mouth daily as needed (Neuropathic pain). 12/14/17 10/21/21  Dana Allan I, MD  heparin 1000 unit/mL SOLN injection 3 mLs (3,000 Units total) by Dialysis route as needed (in dialysis as needed). 10/04/20   Jadene Pierini E, PA-C  heparin  1000 unit/mL SOLN injection 2 mLs (2,000 Units total) by Dialysis route as needed (in dialysis as needed). 10/04/20   Jadene Pierini E, PA-C  heparin 1000 unit/mL SOLN injection 1 mL (1,000 Units total) by Dialysis route as needed (in dialysis). 10/04/20   Jadene Pierini E, PA-C  hydrALAZINE (APRESOLINE) 100 MG tablet Take 1 tablet (100 mg total) by mouth every 8 (eight) hours. Patient taking differently: Take 100 mg by mouth 3 (three) times daily. 07/06/20   Cresenzo, Angelyn Punt, MD  hydrocortisone (ANUSOL-HC) 2.5 % rectal cream Place rectally 2 (two) times daily. Patient taking differently: Place 1 Application rectally daily as needed for hemorrhoids or anal itching. 07/23/21   [name], MD  isosorbide mononitrate (IMDUR) 60 MG 24 hr tablet Take 1 tablet (60 mg total) by mouth daily. 08/14/21   Bhagat, Crista Luria, PA  labetalol (NORMODYNE) 200 MG tablet Take 2 tablets (400 mg total) by mouth 3 (three) times daily. 07/11/21   Bhagat, Bhavinkumar, PA  lidocaine (LIDODERM) 5 % Place 1 patch onto the skin every 12 (twelve) hours as needed. Remove & Discard patch within 12 hours or as directed by MD Patient taking differently: Place 1 patch onto the skin daily as needed (pain). Remove & Discard patch within 12 hours or as directed by MD 07/23/21   [name], MD  LORazepam (ATIVAN) 1 MG tablet Take 1 mg by mouth every 8 (eight) hours as needed for anxiety. 06/25/20   [provider]  montelukast (SINGULAIR) 10 MG tablet Take 10 mg by mouth daily with supper.     [provider]  NIFEdipine (PROCARDIA XL/NIFEDICAL XL) 60 MG 24 hr tablet Take 60 mg by mouth 2 (two) times daily. 10/01/21   [provider]  olmesartan (BENICAR) 40 MG tablet Take 40 mg by mouth every evening. 05/06/19   [provider]  ondansetron (ZOFRAN) 4 MG tablet Take 4 mg by mouth 2 (two) times daily as needed for nausea or vomiting.    [provider]  OVER THE COUNTER MEDICATION Take by mouth daily as needed  (sickness). Elderberry Fruit and flower 1 dropper full by mouth daily as needed for sickness    [provider]  Oxycodone HCl 10 MG TABS Take 10 mg by mouth daily as needed for pain. 01/11/21   [provider]  OXYGEN Inhale 2 L into the lungs as directed.    [provider]  pantoprazole (PROTONIX) 40 MG tablet Take 40 mg by mouth daily. 06/09/20   [provider]  predniSONE (DELTASONE) 5 MG tablet Take 1 tablet (5 mg total) by mouth daily with breakfast. 02/27/22   Nani Gasser, MD  Probiotic Product (PROBIOTIC PO) Take 1 capsule by mouth daily.    [provider]  sorbitol 70 % SOLN Take 30 ml 1-2 times daily  as needed for constipation 07/23/21   [name], MD  Tiotropium Bromide-Olodaterol (STIOLTO RESPIMAT) 2.5-2.5 MCG/ACT AERS Inhale 2 puffs once daily into the lungs. 11/28/21   Hunsucker, Bonna Gains, MD  VENTOLIN HFA 108 (90 Base) MCG/ACT inhaler Inhale 2 puffs into the lungs See admin instructions. Take 2 puffs every 4-6 hours as needed for wheezing and shortness of breath 07/10/21   Hunsucker, Bonna Gains, MD  zolpidem (AMBIEN) 5 MG tablet Take 5 mg by mouth at bedtime as needed for sleep. 02/04/21   [provider]      Allergies    3-methyl-2-benzothiazolinone hydrazone, Banana, Black walnut flavor, Hazelnut (filbert) allergy skin test, Leflunomide and related, Lisinopril, No healthtouch food allergies, Other, Pecan extract allergy skin test, Pecan nut (diagnostic), Trazodone and nefazodone, Adalimumab, Escitalopram oxalate, Ezetimibe, Secukinumab, Statins, Ferrlecit [na ferric gluc cplx in sucrose], Gabapentin, Ibuprofen, and Duloxetine    Review of Systems   Review of Systems  Constitutional:  Negative for chills, diaphoresis, fatigue and fever.  HENT:  Negative for congestion, rhinorrhea and sneezing.   Eyes: Negative.   Respiratory:  Negative for cough, chest tightness and shortness of breath.   Cardiovascular:  Positive for  chest pain. Negative for leg swelling.  Gastrointestinal:  Positive for blood in stool. Negative for abdominal pain, diarrhea, nausea and vomiting.  Genitourinary:  Negative for difficulty urinating, flank pain, frequency and hematuria.  Musculoskeletal:  Negative for arthralgias and back pain.  Skin:  Negative for rash.  Neurological:  Negative for dizziness, speech difficulty, weakness, numbness and headaches.    Physical Exam Updated Vital Signs BP (!) 251/86   Pulse 64   Temp 98.4 F (36.9 C)   Resp 17   Ht 5\' 3"  (1.6 m)   Wt 59.5 kg   SpO2 94%   BMI 23.24 kg/m  Physical Exam Constitutional:      Appearance: She is well-developed.  HENT:     Head: Normocephalic and atraumatic.  Eyes:     Pupils: Pupils are equal, round, and reactive to light.  Cardiovascular:     Rate and Rhythm: Normal rate and regular rhythm.     Heart sounds: Murmur heard.  Pulmonary:     Effort: Pulmonary effort is normal. No respiratory distress.     Breath sounds: Normal breath sounds. No wheezing or rales.  Chest:     Chest wall: Tenderness (Positive reproducible tenderness to the right chest wall, no crepitus or deformity) present.  Abdominal:     General: Bowel sounds are normal.     Palpations: Abdomen is soft.     Tenderness: There is no abdominal tenderness. There is no guarding or rebound.  Genitourinary:    Comments: Small amount of red blood on rectal exam Musculoskeletal:        General: Normal range of motion.     Cervical back: Normal range of motion and neck supple.  Lymphadenopathy:     Cervical: No cervical adenopathy.  Skin:    General: Skin is warm and dry.     Findings: No rash.  Neurological:     Mental Status: She is alert and oriented to person, place, and time.     ED Results / Procedures / Treatments   Labs (all labs ordered are listed, but only abnormal results are displayed) Labs Reviewed  BASIC METABOLIC PANEL - Abnormal; Notable for the following  components:      Result Value   Sodium 130 (*)    Potassium 5.8 (*)  BUN 30 (*)    Creatinine, Ser 5.02 (*)    Calcium 8.7 (*)    GFR, Estimated 9 (*)    All other components within normal limits  CBC WITH DIFFERENTIAL/PLATELET - Abnormal; Notable for the following components:   WBC 3.6 (*)    MCV 100.5 (*)    RDW 18.7 (*)    Lymphs Abs 0.6 (*)    All other components within normal limits  OCCULT BLOOD X 1 CARD TO LAB, STOOL - Abnormal; Notable for the following components:   Fecal Occult Bld POSITIVE (*)    All other components within normal limits  TROPONIN I (HIGH SENSITIVITY) - Abnormal; Notable for the following components:   Troponin I (High Sensitivity) 24 (*)    All other components within normal limits  TROPONIN I (HIGH SENSITIVITY) - Abnormal; Notable for the following components:   Troponin I (High Sensitivity) 21 (*)    All other components within normal limits    EKG EKG Interpretation  Date/Time:  Tuesday March 18 2022 08:37:48 EST Ventricular Rate:  68 PR Interval:  175 QRS Duration: 88 QT Interval:  421 QTC Calculation: 448 R Axis:   -41 Text Interpretation: Sinus rhythm Left axis deviation Confirmed by Malvin Johns ([id number]) on 03/18/2022 9:06:35 AM  Radiology No results found.  Procedures Procedures    Medications Ordered in ED Medications  hydrALAZINE (APRESOLINE) tablet 100 mg (has no administration in time range)  sodium zirconium cyclosilicate (LOKELMA) packet 5 g (5 g Oral Given 03/18/22 1116)  alum & mag hydroxide-simeth (MAALOX/MYLANTA) 200-200-20 MG/5ML suspension 30 mL (30 mLs Oral Given 03/18/22 1329)    ED Course/ Medical Decision Making/ A&P                             Medical Decision Making Amount and/or Complexity of Data Reviewed Labs: ordered.  Risk OTC drugs. Prescription drug management. Decision regarding hospitalization.   Patient is a 62 year old female who presents with lower GI bleeding.  She has had 5 episodes  during the night of bright red blood per rectum.  She showed me pictures of the blood in the toilet.  She has had some clots as well.  She has not had any further episodes since she has been in the ED.  Her hemoglobin is actually a bit elevated as compared to her baseline values on chart review.  Her electrolytes show an elevated potassium at 5.8.  She does not have any changes noted on EKG.  She was given a dose of Lokelma.  She does state that she completed dialysis yesterday.  She does not have any suggestions of fluid overload.  She initially did not have any abdominal pain.  During the course of the ED stay, she developed a little bit of epigastric discomfort and she thinks is because she has not eaten.  Was given a dose of Maalox.  I feel given that she has had multiple episodes of the bright red blood which was seen on my rectal exam, it would be prudent to admit her for observation and serial hemoglobin checks.  On chart review, I do see that she has been admitted previously for upper GI bleeding and has previously seen [facility] gastroenterology.  On her prior EGDs, it was determined that she had some esophagitis and a nonbleeding ulcer.  Her blood pressure has been elevated during her ED stay.  She initially took her home dose of Coreg.  She is asymptomatic from this.  Will also give her her home dose of hydralazine.  I spoke with Dr. Lorin Mercy who will admit the patient for further treatment.  Final Clinical Impression(s) / ED Diagnoses Final diagnoses:  Rectal bleeding  Hyperkalemia    Rx / DC Orders ED Discharge Orders     None         Malvin Johns, MD 03/18/22 1438

## 2022-03-18 NOTE — Assessment & Plan Note (Signed)
- 

## 2022-03-18 NOTE — Assessment & Plan Note (Signed)
Continue prednisone 5 mg p.o. daily 

## 2022-03-18 NOTE — ED Triage Notes (Addendum)
C/o blood in stools since Sunday. States hx of GI bleed. States had some rectal pain Sunday. Denies abdominal pain. Last dialysis tx yesterday, full treatment performed. Wears 2L O2 at baseline

## 2022-03-18 NOTE — Assessment & Plan Note (Signed)
Patient noncompliant with CPAP continue oxygen

## 2022-03-18 NOTE — H&P (Signed)
Jodi Bell HGD:[id number] DOB: May 01, 1960 DOA: 03/18/2022     PCP: [name]., MD   Outpatient Specialists:  CARDS:  Leanor Kail, PA   Dr. Marlou Porch NEphrology:  Dr. Neta Ehlers  Pulmonology at [facility]   [facility], ) Dr. Paulita Fujita    Patient arrived to ER on 03/18/22 at [facility] Referred by Attending Jodi Bongo, MD   Patient coming from:    home Lives a With family    Chief Complaint:   Chief Complaint  Patient presents with   Rectal Bleeding    HPI: Jodi Bell is a 62 y.o. female with medical history significant of COPD on 2 L of oxygen, end-stage renal disease on hemodialysis Monday Wednesday Friday, CAD, uncontrolled hypertension, GI bleed in the past, Dm 2, OSA    Presented with bright red blood per rectum 2-day history of blood per stools.  History of GI bleeding in the past.  Associate some rectal pain.  No abdominal pain She gets dialyzed Monday Wednesday Friday last dialysis on Monday and has full treatment.  Patient is on oxygen baseline 2 L Known history of severe hypertension she states that first 2 days ago she had 1 episode of bright red blood per rectum but then at night she had 5 more.  The rectal pain now has resolved.  No associated nausea or vomiting.  She has baseline anemia and she has been a little bit of fatigue a little more short of breath than baseline but not needing to increase her oxygen requirement Her doctor had to switch her blood pressure medications to carvedilol because her blood pressure was not well-controlled Endorses mild right-sided chest pain    No BM in ER  No CP now Last bloody BM was last night Each time a bout half a cup Not vomiting  Reports epigastric pain   Not on aspirin, not on Nsaid She is on Prednisone for psoriatic arthritis  Does not smoke now, does not drink    Regarding pertinent Chronic problems:        HTN on Procardia, Benicar, Imdur, hydralazine, Coreg, clonidine,  chronic CHF diastolic  - last echo  10/6220 Grade II diastolic  EF 65 to 97%.     CAD  -, CAD s/p CABG x 2 09/2020  does not tolerate Aspirin or statin, on betablocker,                  -  followed by cardiology                   Gout - on allopurinol    COPD -  followed by pulmonology   on baseline oxygen  2 L,    PAF post op not on bloodthiner    OSA - noncompliant with CPAP  Dm2 diet controlled   End-stage renal disease on hemodialysis  Lab Results  Component Value Date   CREATININE 5.02 (H) 03/18/2022   CREATININE 5.63 (H) 02/26/2022   CREATININE 4.77 (H) 02/25/2022     Chronic anemia - baseline hg Hemoglobin & Hematocrit  Recent Labs    02/25/22 1252 02/26/22 0703 03/18/22 0843  HGB 11.0* 10.5* 12.8    While in ER:    No detection have improved hemoglobin up to 12.8 Chest wall tenderness reproducible palpation Rectal exams showed small amount of red blood Noted have sodium 130 potassium 5.8 was given a dose of Lokelma Hemoccult positive Troponin stable between 2124 Persistently hypertensive in 240s No evidence  of fluid overload In the past has been seen by [facility] for GI bleeding.  Was found to have esophagitis and nonbleeding ulcer.    Following Medications were ordered in ER: Medications  sodium zirconium cyclosilicate (LOKELMA) packet 5 g (5 g Oral Given 03/18/22 1116)  alum & mag hydroxide-simeth (MAALOX/MYLANTA) 200-200-20 MG/5ML suspension 30 mL (30 mLs Oral Given 03/18/22 1329)  hydrALAZINE (APRESOLINE) tablet 100 mg (100 mg Oral Given 03/18/22 1442)       ED Triage Vitals  Enc Vitals Group     BP 03/18/22 0833 (!) 157/117     Pulse Rate 03/18/22 0833 76     Resp 03/18/22 0833 18     Temp 03/18/22 1240 98.4 F (36.9 C)     Temp Source 03/18/22 1240 Oral     SpO2 03/18/22 0833 99 %     Weight 03/18/22 0832 131 lb 2.8 oz (59.5 kg)     Height 03/18/22 0832 5\' 3"  (1.6 m)     Head Circumference --      Peak Flow --      Pain Score 03/18/22 0832 0     Pain Loc --      Pain Edu? --       Excl. in [place]? --   TMAX(24)@     _________________________________________ Significant initial  Findings: Abnormal Labs Reviewed  BASIC METABOLIC PANEL - Abnormal; Notable for the following components:      Result Value   Sodium 130 (*)    Potassium 5.8 (*)    BUN 30 (*)    Creatinine, Ser 5.02 (*)    Calcium 8.7 (*)    GFR, Estimated 9 (*)    All other components within normal limits  CBC WITH DIFFERENTIAL/PLATELET - Abnormal; Notable for the following components:   WBC 3.6 (*)    MCV 100.5 (*)    RDW 18.7 (*)    Lymphs Abs 0.6 (*)    All other components within normal limits  OCCULT BLOOD X 1 CARD TO LAB, STOOL - Abnormal; Notable for the following components:   Fecal Occult Bld POSITIVE (*)    All other components within normal limits  TROPONIN I (HIGH SENSITIVITY) - Abnormal; Notable for the following components:   Troponin I (High Sensitivity) 24 (*)    All other components within normal limits  TROPONIN I (HIGH SENSITIVITY) - Abnormal; Notable for the following components:   Troponin I (High Sensitivity) 21 (*)    All other components within normal limits    _________________________ Troponin 21-24 ECG: Ordered Personally reviewed and interpreted by me showing: HR : 68 Rhythm: Sinus rhythm Left axis deviation  QTC 448   The recent clinical data is shown below. Vitals:   03/18/22 1415 03/18/22 1515 03/18/22 1645 03/18/22 1827  BP: (!) 251/86 (!) 257/90 (!) 247/86 (!) 243/86  Pulse: 64 66 63   Resp: 17 15 (!) 21 17  Temp:      TempSrc:      SpO2: 94% 95% 95%   Weight:      Height:          WBC     Component Value Date/Time   WBC 3.6 (L) 03/18/2022 0843   LYMPHSABS 0.6 (L) 03/18/2022 0843   MONOABS 0.3 03/18/2022 0843   EOSABS 0.4 03/18/2022 0843   BASOSABS 0.0 03/18/2022 0843     _______________________________________________ Hospitalist was called for admission for   Rectal bleeding    Hyperkalemia accelerated hypertension  The following Work  up has been ordered so far:  Orders Placed This Encounter  Procedures   Basic metabolic panel   CBC with Differential   Occult blood card to lab, stool Provider will collect   Cardiac Monitoring Continuous x 24 hours Indications for use: Other; other indications for use: hyperkalemia   Consult to hospitalist   EKG 12-Lead   Place in observation (patient's expected length of stay will be less than 2 midnights)   Place in observation (patient's expected length of stay will be less than 2 midnights)     OTHER Significant initial  Findings:  labs showing:    Recent Labs  Lab 03/18/22 0843  NA 130*  K 5.8*  CO2 22  GLUCOSE 94  BUN 30*  CREATININE 5.02*  CALCIUM 8.7*    Cr   stable,    Lab Results  Component Value Date   CREATININE 5.02 (H) 03/18/2022   CREATININE 5.63 (H) 02/26/2022   CREATININE 4.77 (H) 02/25/2022    Recent Labs  Lab 03/18/22 1947  AST 17  ALT 8  ALKPHOS 114  BILITOT 0.8  PROT 5.8*  ALBUMIN 2.9*   Lab Results  Component Value Date   CALCIUM 9.1 03/18/2022   PHOS 5.2 (H) 03/18/2022    Plt: Lab Results  Component Value Date   PLT 156 03/18/2022      COVID-19 Labs  No results for input(s): "DDIMER", "FERRITIN", "LDH", "CRP" in the last 72 hours.  Lab Results  Component Value Date   SARSCOV2NAA NEGATIVE 10/20/2021   SARSCOV2NAA NEGATIVE 06/26/2021   SARSCOV2NAA NEGATIVE 05/02/2021   SARSCOV2NAA POSITIVE (A) 02/13/2021    Recent Labs  Lab 03/18/22 0843  WBC 3.6*  NEUTROABS 2.3  HGB 12.8  HCT 41.1  MCV 100.5*  PLT 156    HG/HCT   stable,      Component Value Date/Time   HGB 12.1 03/18/2022 1947   HCT 38.0 03/18/2022 1947   MCV 99.0 03/18/2022 1947   MCV 94.5 10/12/2012 1211       Cardiac Panel (last 3 results) Recent Labs    03/18/22 1947  CKTOTAL 58    .car BNP (last 3 results) Recent Labs    10/20/21 1609  BNP >4,500.0*      DM  labs:  HbA1C: No results for input(s): "HGBA1C" in the last 8760  hours.     CBG (last 3)  No results for input(s): "GLUCAP" in the last 72 hours.        Cultures:    Component Value Date/Time   SDES BLOOD SITE NOT SPECIFIED 06/26/2021 0650   SPECREQUEST  06/26/2021 0650    BOTTLES DRAWN AEROBIC AND ANAEROBIC Blood Culture results may not be optimal due to an excessive volume of blood received in culture bottles   CULT  06/26/2021 0650    NO GROWTH 5 DAYS Performed at [facility], [place] [address], [place], [place] [address]    REPTSTATUS 07/01/2021 FINAL 06/26/2021 0650     Radiological Exams on Admission: No results found. _______________________________________________________________________________________________________ Latest  Blood pressure (!) 243/86, pulse 63, temperature 98.4 F (36.9 C), resp. rate 17, height 5\' 3"  (1.6 m), weight 59.5 kg, SpO2 95 %.   Vitals  labs and radiology finding personally reviewed  Review of Systems:    Pertinent positives include:   blood in stool,  Constitutional:  No weight loss, night sweats, Fevers, chills, fatigue, weight loss  HEENT:  No headaches, Difficulty swallowing,Tooth/dental problems,Sore  throat,  No sneezing, itching, ear ache, nasal congestion, post nasal drip,  Cardio-vascular:  No chest pain, Orthopnea, PND, anasarca, dizziness, palpitations.no Bilateral lower extremity swelling  GI:  No heartburn, indigestion, abdominal pain, nausea, vomiting, diarrhea, change in bowel habits, loss of appetite, melena, hematemesis Resp:  no shortness of breath at rest. No dyspnea on exertion, No excess mucus, no productive cough, No non-productive cough, No coughing up of blood.No change in color of mucus.No wheezing. Skin:  no rash or lesions. No jaundice GU:  no dysuria, change in color of urine, no urgency or frequency. No straining to urinate.  No flank pain.  Musculoskeletal:  No joint pain or no joint swelling. No decreased range of motion. No back pain.  Psych:  No change in  mood or affect. No depression or anxiety. No memory loss.  Neuro: no localizing neurological complaints, no tingling, no weakness, no double vision, no gait abnormality, no slurred speech, no confusion  All systems reviewed and apart from Brinckerhoff all are negative _______________________________________________________________________________________________ Past Medical History:   Past Medical History:  Diagnosis Date   Anemia of chronic disease    Anxiety    Arthritis    oa and psoriatic ra   Asthma    Back pain, chronic    lower back   Chronic insomnia    COPD (chronic obstructive pulmonary disease) (HCC)    Diabetes mellitus    type 2   ESRD on dialysis [facility])    Essential hypertension    GERD (gastroesophageal reflux disease)    Gout    History of hiatal hernia    Hypoalbuminemia    Hyponatremia    Mild aortic stenosis    Mitral regurgitation    Mitral stenosis    Obesity    PAD (peripheral artery disease) ([place])    a. externial iliac calcification seen on CT 04/2020.   Sleep apnea    Tobacco abuse    Upper GI bleed 05/2019     Past Surgical History:  Procedure Laterality Date   BACK SURGERY  2016   lower back fusion with cage   BIOPSY  09/23/2017   Procedure: BIOPSY;  Surgeon: Wonda Horner, MD;  Location: [facility];  Service: Endoscopy;;   BIOPSY  05/19/2019   Procedure: BIOPSY;  Surgeon: Otis Brace, MD;  Location: [place];  Service: Gastroenterology;;   CESAREAN SECTION  1990   x 1    COLONOSCOPY WITH PROPOFOL N/A 09/23/2017   Procedure: COLONOSCOPY WITH PROPOFOL Hemostatic clips placed;  Surgeon: Wonda Horner, MD;  Location: [facility];  Service: Endoscopy;  Laterality: N/A;   COLONOSCOPY WITH PROPOFOL N/A 10/02/2020   Procedure: COLONOSCOPY WITH PROPOFOL;  Surgeon: Arta Silence, MD;  Location: [place];  Service: Endoscopy;  Laterality: N/A;   CORONARY ARTERY BYPASS GRAFT N/A 09/13/2020   Procedure: CORONARY ARTERY BYPASS GRAFTING (CABG), ON  PUMP, TIMES TWO, USING LEFT INTERNAL MAMMARY ARTERY AND RIGHT ENDOSCOPICALLY HARVESTED GREATER SAPHENOUS VEIN;  Surgeon: Gaye Pollack, MD;  Location: [place];  Service: Open Heart Surgery;  Laterality: N/A;   DILATION AND CURETTAGE OF UTERUS  1988   ENTEROSCOPY N/A 07/22/2021   Procedure: ENTEROSCOPY;  Surgeon: Ronnette Juniper, MD;  Location: [place];  Service: Gastroenterology;  Laterality: N/A;   ESOPHAGOGASTRODUODENOSCOPY N/A 09/22/2020   Procedure: ESOPHAGOGASTRODUODENOSCOPY (EGD);  Surgeon: Arta Silence, MD;  Location: [facility] ENDOSCOPY;  Service: Endoscopy;  Laterality: N/A;   ESOPHAGOGASTRODUODENOSCOPY (EGD) WITH PROPOFOL N/A 09/23/2017   Procedure: ESOPHAGOGASTRODUODENOSCOPY (EGD) WITH PROPOFOL;  Surgeon: Wonda Horner, MD;  Location: [facility];  Service: Endoscopy;  Laterality: N/A;   ESOPHAGOGASTRODUODENOSCOPY (EGD) WITH PROPOFOL N/A 05/19/2019   Procedure: ESOPHAGOGASTRODUODENOSCOPY (EGD) WITH PROPOFOL;  Surgeon: Otis Brace, MD;  Location: [place];  Service: Gastroenterology;  Laterality: N/A;   ESOPHAGOGASTRODUODENOSCOPY (EGD) WITH PROPOFOL N/A 10/02/2020   Procedure: ESOPHAGOGASTRODUODENOSCOPY (EGD) WITH PROPOFOL;  Surgeon: Arta Silence, MD;  Location: [place];  Service: Endoscopy;  Laterality: N/A;   GIVENS CAPSULE STUDY N/A 05/19/2019   Procedure: GIVENS CAPSULE STUDY;  Surgeon: Otis Brace, MD;  Location: [place];  Service: Gastroenterology;  Laterality: N/A;   GIVENS CAPSULE STUDY N/A 07/19/2021   Procedure: GIVENS CAPSULE STUDY;  Surgeon: Clarene Essex, MD;  Location: [place];  Service: Gastroenterology;  Laterality: N/A;   HEMOSTASIS CLIP PLACEMENT  09/22/2020   Procedure: HEMOSTASIS CLIP PLACEMENT;  Surgeon: Arta Silence, MD;  Location: [place];  Service: Endoscopy;;   HEMOSTASIS CONTROL  09/22/2020   Procedure: HEMOSTASIS CONTROL;  Surgeon: Arta Silence, MD;  Location: [place];  Service: Endoscopy;;   HOT HEMOSTASIS N/A 05/19/2019    Procedure: HOT HEMOSTASIS (ARGON PLASMA COAGULATION/BICAP);  Surgeon: Otis Brace, MD;  Location[facility]COPY;  Service: Gastroenterology;  Laterality: N/A;   LEFT HEART CATH AND CORONARY ANGIOGRAPHY N/A 09/10/2020   Procedure: LEFT HEART CATH AND CORONARY ANGIOGRAPHY;  Surgeon: Nelva Bush, MD;  Location: [place] [facility];  Service: Cardiovascular;  Laterality: N/A;   PARTIAL KNEE ARTHROPLASTY Left 11/12/2017   Procedure: left unicompartmental arthroplasty-medial;  Surgeon: Paralee Cancel, MD;  Location: [facility];  Service: Orthopedics;  Laterality: Left;  38min   SUBMUCOSAL INJECTION  09/23/2017   Procedure: SUBMUCOSAL INJECTION;  Surgeon: Wonda Horner, MD;  Location: [facility];  Service: Endoscopy;;  in colon   TEE WITHOUT CARDIOVERSION N/A 09/13/2020   Procedure: TRANSESOPHAGEAL ECHOCARDIOGRAM (TEE);  Surgeon: Gaye Pollack, MD;  Location: [place];  Service: Open Heart Surgery;  Laterality: N/A;   TUBAL LIGATION  1990    Social History:  Ambulatory   cane, walker     reports that she quit smoking about 19 months ago. Her smoking use included cigarettes. She has a 20.00 pack-year smoking history. She has never used smokeless tobacco. She reports current alcohol use. She reports that she does not use drugs.    Family History:  Family History  Problem Relation Age of Onset   Lung cancer Mother    Diabetes Sister    Heart disease Sister    Asthma Daughter    Arthritis Daughter    Arthritis Daughter    Thyroid cancer Other        1/2 sister   Heart disease Other        1/2 sister   ______________________________________________________________________________________________ Allergies: Allergies  Allergen Reactions   3-Methyl-2-Benzothiazolinone Hydrazone Other (See Comments)    Muscle weakness muscle cramping in legs Other reaction(s): Myalgias (Muscle Pain)   Banana Anaphylaxis, Swelling and Other (See Comments)    TONGUE AND MOUTH SWELLING   Black Walnut Flavor  Anaphylaxis and Itching    Walnuts   Hazelnut ([place]) Allergy Skin Test Anaphylaxis    Hazelnuts   Leflunomide And Related Other (See Comments)    Severe Headache   Lisinopril Swelling    Angioedema  Swelling of the face   No [employer] Food Allergies Anaphylaxis    Spicy Mustard 4.7.2021 Pt reports that she eats Regular Yellow Mustard Swelling and itching of tongue   Other Other (See Comments), Anaphylaxis and Swelling  Cosentyx= angioedema  Other reaction(s): Facial Edema (intolerance) Mouth itches and tongue swelling Hazel nuts and pecans also Walnuts Walnuts Renal failure Other reaction(s): Other Serotonin Syndrome  Serotonin syndrome  Tremors Other reaction(s): Other Tremors Muscle weakness muscle cramping in legs Other reaction(s): Myalgias (Muscle Pain) Mouth itches and tongue swelling Hazel nuts and pecans also Walnuts    Pecan Extract Allergy Skin Test Anaphylaxis and Itching    Pecans    Pecan Nut (Diagnostic) Anaphylaxis and Itching    Pecans   Trazodone And Nefazodone Other (See Comments)    Serotonin Syndrome  Tremors   Adalimumab Other (See Comments)    Blisters on abdomen =humira   Escitalopram Oxalate Other (See Comments)    Hand problems - Serotonin Syndrome     Ezetimibe Other (See Comments)    myalgias cramps    Secukinumab Swelling    Cosentyx = angiodema    Statins Other (See Comments)    Leg pains and weakness   Ferrlecit [Na Ferric Gluc Cplx In Sucrose]     Not reaction given from HD   Gabapentin Other (See Comments)    tremors   Ibuprofen Other (See Comments)    Renal Problems   Duloxetine Rash     Prior to Admission medications   Medication Sig Start Date End Date Taking? Authorizing Provider  acetaminophen (TYLENOL) 500 MG tablet Take 1,000 mg by mouth daily as needed (pain).    [provider]  albuterol (PROVENTIL) (2.5 MG/3ML) 0.083% nebulizer solution Take 3 mLs (2.5 mg total) by nebulization every 6  (six) hours as needed for wheezing or shortness of breath. 07/10/21   Hunsucker, Bonna Gains, MD  allopurinol (ZYLOPRIM) 100 MG tablet Take 100 mg by mouth every evening.    [provider]  calcium acetate (PHOSLO) 667 MG capsule Take 1,334 mg by mouth 3 (three) times daily with meals. 05/13/21   [provider]  carboxymethylcellulose 1 % ophthalmic solution Place 2 drops into both eyes 2 (two) times daily as needed (dry eyes).    [provider]  cetirizine (ZYRTEC) 10 MG tablet Take 10 mg by mouth daily as needed for allergies.    [provider]  cholecalciferol (VITAMIN D) 25 MCG tablet Take 1 tablet (1,000 Units total) by mouth every evening. 10/04/20   Gold, Wilder Glade, PA-C  cholecalciferol (VITAMIN D3) 10 MCG (400 UNIT) TABS tablet Take 800 Units by mouth daily.    [provider]  cinacalcet (SENSIPAR) 60 MG tablet Take 60 mg by mouth daily.    [provider]  cloNIDine (CATAPRES) 0.3 MG tablet Take 0.3 mg by mouth 3 (three) times daily.    [provider]  cyclobenzaprine (FLEXERIL) 10 MG tablet Take 10 mg by mouth 2 (two) times daily as needed for muscle spasms.    [provider]  Darbepoetin Alfa (ARANESP) 200 MCG/0.4ML SOSY injection Inject 0.4 mLs (200 mcg total) into the vein every Wednesday with hemodialysis. 10/10/20   Jadene Pierini E, PA-C  diclofenac Sodium (VOLTAREN) 1 % GEL Apply 2 g topically daily as needed (pain).    [provider]  docusate sodium (COLACE) 100 MG capsule Take 100-200 mg by mouth daily as needed for mild constipation.    [provider]  ferrous sulfate 325 (65 FE) MG tablet Take 325 mg by mouth See admin instructions. Take 325 mg by mouth three times a week Tues, Thur, Sat    [provider]  folic acid (FOLVITE) 174  MCG tablet Take 400 mcg by mouth daily.    [provider]  gabapentin (NEURONTIN) 300 MG capsule Take 1 capsule (300 mg total) by mouth at  bedtime as needed (Neuropathic pain). Patient taking differently: Take 300 mg by mouth daily as needed (Neuropathic pain). 12/14/17 10/21/21  Dana Allan I, MD  heparin 1000 unit/mL SOLN injection 3 mLs (3,000 Units total) by Dialysis route as needed (in dialysis as needed). 10/04/20   Jadene Pierini E, PA-C  heparin 1000 unit/mL SOLN injection 2 mLs (2,000 Units total) by Dialysis route as needed (in dialysis as needed). 10/04/20   Jadene Pierini E, PA-C  heparin 1000 unit/mL SOLN injection 1 mL (1,000 Units total) by Dialysis route as needed (in dialysis). 10/04/20   Jadene Pierini E, PA-C  hydrALAZINE (APRESOLINE) 100 MG tablet Take 1 tablet (100 mg total) by mouth every 8 (eight) hours. Patient taking differently: Take 100 mg by mouth 3 (three) times daily. 07/06/20   Cresenzo, Angelyn Punt, MD  hydrocortisone (ANUSOL-HC) 2.5 % rectal cream Place rectally 2 (two) times daily. Patient taking differently: Place 1 Application rectally daily as needed for hemorrhoids or anal itching. 07/23/21   [name], MD  isosorbide mononitrate (IMDUR) 60 MG 24 hr tablet Take 1 tablet (60 mg total) by mouth daily. 08/14/21   Bhagat, Crista Luria, PA  labetalol (NORMODYNE) 200 MG tablet Take 2 tablets (400 mg total) by mouth 3 (three) times daily. 07/11/21   Bhagat, Bhavinkumar, PA  lidocaine (LIDODERM) 5 % Place 1 patch onto the skin every 12 (twelve) hours as needed. Remove & Discard patch within 12 hours or as directed by MD Patient taking differently: Place 1 patch onto the skin daily as needed (pain). Remove & Discard patch within 12 hours or as directed by MD 07/23/21   [name], MD  LORazepam (ATIVAN) 1 MG tablet Take 1 mg by mouth every 8 (eight) hours as needed for anxiety. 06/25/20   [provider]  montelukast (SINGULAIR) 10 MG tablet Take 10 mg by mouth daily with supper.     [provider]  NIFEdipine (PROCARDIA XL/NIFEDICAL XL) 60 MG 24 hr tablet Take 60 mg by mouth 2 (two) times daily. 10/01/21    [provider]  olmesartan (BENICAR) 40 MG tablet Take 40 mg by mouth every evening. 05/06/19   [provider]  ondansetron (ZOFRAN) 4 MG tablet Take 4 mg by mouth 2 (two) times daily as needed for nausea or vomiting.    [provider]  OVER THE COUNTER MEDICATION Take by mouth daily as needed (sickness). Elderberry Fruit and flower 1 dropper full by mouth daily as needed for sickness    [provider]  Oxycodone HCl 10 MG TABS Take 10 mg by mouth daily as needed for pain. 01/11/21   [provider]  OXYGEN Inhale 2 L into the lungs as directed.    [provider]  pantoprazole (PROTONIX) 40 MG tablet Take 40 mg by mouth daily. 06/09/20   [provider]  predniSONE (DELTASONE) 5 MG tablet Take 1 tablet (5 mg total) by mouth daily with breakfast. 02/27/22   Nani Gasser, MD  Probiotic Product (PROBIOTIC PO) Take 1 capsule by mouth daily.    [provider]  sorbitol 70 % SOLN Take 30 ml 1-2 times daily as needed for constipation 07/23/21   [name], MD  Tiotropium Bromide-Olodaterol (STIOLTO RESPIMAT) 2.5-2.5 MCG/ACT AERS Inhale 2 puffs once daily into the lungs. 11/28/21   Hunsucker,  Bonna Gains, MD  VENTOLIN HFA 108 [phone] Base) MCG/ACT inhaler Inhale 2 puffs into the lungs See admin instructions. Take 2 puffs every 4-6 hours as needed for wheezing and shortness of breath 07/10/21   Hunsucker, Bonna Gains, MD  zolpidem (AMBIEN) 5 MG tablet Take 5 mg by mouth at bedtime as needed for sleep. 02/04/21   [provider]    ___________________________________________________________________________________________________ Physical Exam:    03/18/2022    6:27 PM 03/18/2022    4:45 PM 03/18/2022    3:15 PM  Vitals with BMI  Systolic 401 027 253  Diastolic 86 86 90  Pulse  63 66     1. General:  in No  Acute distress    Chronically ill   -appearing 2. Psychological: Alert and   Oriented 3. Head/ENT:    Dry Mucous  Membranes                          Head Non traumatic, neck supple                          Poor Dentition 4. SKIN:  decreased Skin turgor,  Skin clean Dry and intact no rash 5. Heart: Regular rate and rhythm systolic Murmur, no Rub or gallop 6. Lungs:  no wheezes or crackles   7. Abdomen: Soft,  non-tender,  distended   obese  bowel sounds present 8. Lower extremities: no clubbing, cyanosis, no  edema 9. Neurologically Grossly intact, moving all 4 extremities equally   10. MSK: Normal range of motion    Chart has been reviewed  ______________________________________________________________________________________________  Assessment/Plan  62 y.o. female with medical history significant of COPD on 2 L of oxygen, end-stage renal disease on hemodialysis Monday Wednesday Friday, CAD, uncontrolled hypertension, GI bleed   Admitted for  Rectal bleeding  Hyperkalemia accelerated hypertension    Present on Admission:  Acute lower GI bleeding  Essential hypertension  GERD (gastroesophageal reflux disease)  ESRD (end stage renal disease) (HCC)  OSA (obstructive sleep apnea)  Coronary artery disease involving coronary bypass graft of native heart with angina pectoris (HCC)  Paroxysmal atrial fibrillation (HCC)  Psoriasis arthropathica (HCC)  Anemia  Chronic diastolic CHF (congestive heart failure) (HCC)  COPD (chronic obstructive pulmonary disease) (HCC)  Chronic respiratory failure with hypoxia (HCC)     Acute lower GI bleeding - Suspect Lower Gi source  No  melena,   to  suggest otherwise  - Admit  For further management given:   Age >60 years, comorbid illnesses      -  most likely Diverticular / anal source          -   hemodynamic instability present      -  Admit to stepdown given above    - I sent msg to  gastroenterology ( [facility]  ) they will see patient in a.m. appreciate their consult   - serial CBC.    - Monitor for any recurrence,  evidence of hemodynamic  instability or significant blood loss -  type and screen,  - Transfuse as needed for hemoglobin below 7 or <9 if evidence of significant  bleeding  - Establish at least 2 PIV and fluid resuscitate   - clear liquids for tonight keep nothing by mouth post midnight,  -  monitor for Recurrent significant  Bleeding     Diabetes mellitus with coincident hypertension ([place]) Order sliding scale check  hemoglobin A1c and TSH diet controlled at baseline  Essential hypertension Restart home medications including Coreg 25 mg p.o. twice daily, Clonidine 0.3 mg p.o. 3 times daily, Hydralazine 100 mg p.o. 3 times daily,  Imdur 60 mg p.o. daily,  nifedipine 60 mg p.o. twice daily  Given accelerated hypertension order hydralazine 10 mg IV as needed for systolic blood pressures above 180 I expect that blood pressure will improve once patient receives her regular home medications  GERD (gastroesophageal reflux disease) Protonix 40 mg IV twice daily  ESRD (end stage renal disease) ([place]) Sent message to nephrology Dr. Posey Pronto is aware patient is on the list to have hemodialysis tomorrow. Given elevated potassium earlier today hold Benicar.  Recheck potassium level  OSA (obstructive sleep apnea) Patient noncompliant with CPAP continue oxygen  Coronary artery disease involving coronary bypass graft of native heart with angina pectoris (HCC) Chronic stable patient not on aspirin secondary to history of GI bleeds Not on statin did not tolerate Continue Coreg 25 mg p.o. twice daily  Paroxysmal atrial fibrillation (HCC) Isolated not a candidate for anticoagulation given GI bleeding  Psoriasis arthropathica (HCC) Continue prednisone 5 mg p.o. daily  Anemia Chronic stable actually improved to obtain type and screen in case it worsens and patient needs transfusion CBC every 6 hours  Chronic diastolic CHF (congestive heart failure) (HCC) Chronic stable avoid fluid overload  COPD (chronic obstructive  pulmonary disease) ([place]) Chronic stable continue home medications patient on chronic on oxygen 2 L will continue  Chronic respiratory failure with hypoxia ([place]) Patient chronically on 2 L we will continue.   Chronic hypoxia secondary to COPD  Patient states she has been extra week and has trouble ambulating would like to have PT OT assessment prior to discharge   Other plan as per orders.  DVT prophylaxis:  SCD     Code Status:    Code Status: Prior FULL CODE  as per patient   I had personally discussed CODE STATUS with patient     Family Communication:   Family not at  Bedside    Disposition Plan:      To home once workup is complete and patient is stable   Following barriers for discharge:                            Electrolytes corrected                               Anemia stable                             GI work up                                                         Will need consultants to evaluate patient prior to discharge                     Would benefit from PT/OT eval prior to DC  Ordered  Consults called: [facility] aware, sent page to Nephrology Dr. Posey Pronto   Admission status:  ED Disposition     ED Disposition  [place]: [place] [[id number]]  Level of Care: Progressive [102]  Admit to Progressive based on following criteria: GI, ENDOCRINE disease patients with GI bleeding, acute liver failure or pancreatitis, stable with diabetic ketoacidosis or thyrotoxicosis (hypothyroid) state.  Interfacility transfer: Yes  May place patient in observation at [facility] or [place] if equivalent level of care is available:: No  Covid Evaluation: Asymptomatic - no recent exposure (last 10 days) testing not required  Diagnosis: Acute lower GI bleeding [803212]  Admitting Physician: Jodi Bell [[id number]]  Attending Physician: Jodi Bell [[id number]]           Obs    Level  of care       progressive tele indefinitely please discontinue once patient no longer qualifies COVID-19 Labs   Lazer Wollard Linnell Camp 03/18/2022, 10:18 PM    [facility]     after 2 AM please page floor coverage PA If 7AM-7PM, please contact the day team taking care of the patient using [email or web address]   Patient was evaluated in the context of the global COVID-19 pandemic, which necessitated consideration that the patient might be at risk for infection with the SARS-CoV-2 virus that causes COVID-19. Institutional protocols and algorithms that pertain to the evaluation of patients at risk for COVID-19 are in a state of rapid change based on information released by regulatory bodies including the CDC and federal and state organizations. These policies and algorithms were followed during the patient's care.

## 2022-03-18 NOTE — ED Notes (Signed)
Attempted to call report , nurse at lunch , gave sec my call back number

## 2022-03-18 NOTE — Assessment & Plan Note (Signed)
Order sliding scale check hemoglobin A1c and TSH diet controlled at baseline

## 2022-03-18 NOTE — Assessment & Plan Note (Signed)
-   Suspect Lower Gi source  No  melena,   to  suggest otherwise  - Admit  For further management given:   Age >60 years, comorbid illnesses      -  most likely Diverticular / anal source          -   hemodynamic instability present      -  Admit to stepdown given above    - I sent msg to  gastroenterology ( [facility]  ) they will see patient in a.m. appreciate their consult   - serial CBC.    - Monitor for any recurrence,  evidence of hemodynamic instability or significant blood loss -  type and screen,  - Transfuse as needed for hemoglobin below 7 or <9 if evidence of significant  bleeding  - Establish at least 2 PIV and fluid resuscitate   - clear liquids for tonight keep nothing by mouth post midnight,  -  monitor for Recurrent significant  Bleeding

## 2022-03-18 NOTE — Progress Notes (Signed)
Pt arrived to unit via [facility]. Pt transferred from stretcher to bed x2 assist. Pt A&O x 4, placed on tele, with pt belongings and call bell within reach, bed locked and  in lowest positio with bed alarms in place. RN paged [facility] rounding team for pt arrival and notified of current BP 243/ [phone].

## 2022-03-18 NOTE — Assessment & Plan Note (Signed)
Restart home medications including Coreg 25 mg p.o. twice daily, Clonidine 0.3 mg p.o. 3 times daily, Hydralazine 100 mg p.o. 3 times daily,  Imdur 60 mg p.o. daily,  nifedipine 60 mg p.o. twice daily  Given accelerated hypertension order hydralazine 10 mg IV as needed for systolic blood pressures above 180 I expect that blood pressure will improve once patient receives her regular home medications

## 2022-03-18 NOTE — ED Notes (Signed)
Per dr Tamera Punt pt may take own BP med , coreg

## 2022-03-18 NOTE — Assessment & Plan Note (Signed)
Patient chronically on 2 L we will continue.   Chronic hypoxia secondary to COPD

## 2022-03-18 NOTE — Assessment & Plan Note (Signed)
Sent message to nephrology Dr. Posey Pronto is aware patient is on the list to have hemodialysis tomorrow. Given elevated potassium earlier today hold Benicar.  Recheck potassium level

## 2022-03-18 NOTE — ED Notes (Signed)
Verbal report given to [place], Therapist, sports. ([facility])

## 2022-03-18 NOTE — Assessment & Plan Note (Signed)
Chronic stable patient not on aspirin secondary to history of GI bleeds Not on statin did not tolerate Continue Coreg 25 mg p.o. twice daily

## 2022-03-18 NOTE — Assessment & Plan Note (Signed)
Chronic stable actually improved to obtain type and screen in case it worsens and patient needs transfusion CBC every 6 hours

## 2022-03-19 DIAGNOSIS — I132 Hypertensive heart and chronic kidney disease with heart failure and with stage 5 chronic kidney disease, or end stage renal disease: Secondary | ICD-10-CM | POA: Diagnosis not present

## 2022-03-19 DIAGNOSIS — N186 End stage renal disease: Secondary | ICD-10-CM | POA: Diagnosis not present

## 2022-03-19 DIAGNOSIS — I5032 Chronic diastolic (congestive) heart failure: Secondary | ICD-10-CM | POA: Diagnosis not present

## 2022-03-19 DIAGNOSIS — K922 Gastrointestinal hemorrhage, unspecified: Secondary | ICD-10-CM | POA: Diagnosis not present

## 2022-03-19 LAB — COMPREHENSIVE METABOLIC PANEL
ALT: 9 U/L (ref 0–44)
AST: 15 U/L (ref 15–41)
Albumin: 2.8 g/dL — ABNORMAL LOW (ref 3.5–5.0)
Alkaline Phosphatase: 111 U/L (ref 38–126)
Anion gap: 12 (ref 5–15)
BUN: 35 mg/dL — ABNORMAL HIGH (ref 8–23)
CO2: 25 mmol/L (ref 22–32)
Calcium: 9 mg/dL (ref 8.9–10.3)
Chloride: 94 mmol/L — ABNORMAL LOW (ref 98–111)
Creatinine, Ser: 6.18 mg/dL — ABNORMAL HIGH (ref 0.44–1.00)
GFR, Estimated: 7 mL/min — ABNORMAL LOW (ref >60)
Glucose, Bld: 104 mg/dL — ABNORMAL HIGH (ref 70–99)
Potassium: 4.6 mmol/L (ref 3.5–5.1)
Sodium: 131 mmol/L — ABNORMAL LOW (ref 135–145)
Total Bilirubin: 0.8 mg/dL (ref 0.3–1.2)
Total Protein: 5.7 g/dL — ABNORMAL LOW (ref 6.5–8.1)

## 2022-03-19 LAB — CBC
HCT: 37.1 % (ref 36.0–46.0)
HCT: 38.8 % (ref 36.0–46.0)
HCT: 38.6 % (ref 36.0–46.0)
Hemoglobin: 11.6 g/dL — ABNORMAL LOW (ref 12.0–15.0)
Hemoglobin: 12.5 g/dL (ref 12.0–15.0)
Hemoglobin: 12.2 g/dL (ref 12.0–15.0)
MCH: 31.4 pg (ref 26.0–34.0)
MCH: 31.8 pg (ref 26.0–34.0)
MCH: 31 pg (ref 26.0–34.0)
MCHC: 31.3 g/dL (ref 30.0–36.0)
MCHC: 32.2 g/dL (ref 30.0–36.0)
MCHC: 31.6 g/dL (ref 30.0–36.0)
MCV: 100.3 fL — ABNORMAL HIGH (ref 80.0–100.0)
MCV: 98.7 fL (ref 80.0–100.0)
MCV: 98.2 fL (ref 80.0–100.0)
Platelets: 160 10*3/uL (ref 150–400)
Platelets: 178 10*3/uL (ref 150–400)
Platelets: 179 10*3/uL (ref 150–400)
RBC: 3.7 MIL/uL — ABNORMAL LOW (ref 3.87–5.11)
RBC: 3.93 MIL/uL (ref 3.87–5.11)
RBC: 3.93 MIL/uL (ref 3.87–5.11)
RDW: 18.9 % — ABNORMAL HIGH (ref 11.5–15.5)
RDW: 19 % — ABNORMAL HIGH (ref 11.5–15.5)
RDW: 18.9 % — ABNORMAL HIGH (ref 11.5–15.5)
WBC: 4.1 10*3/uL (ref 4.0–10.5)
WBC: 3.8 10*3/uL — ABNORMAL LOW (ref 4.0–10.5)
WBC: 4.2 10*3/uL (ref 4.0–10.5)
nRBC: 0 % (ref 0.0–0.2)
nRBC: 0 % (ref 0.0–0.2)
nRBC: 0 % (ref 0.0–0.2)

## 2022-03-19 LAB — GLUCOSE, CAPILLARY
Glucose-Capillary: 125 mg/dL — ABNORMAL HIGH (ref 70–99)
Glucose-Capillary: 87 mg/dL (ref 70–99)
Glucose-Capillary: 92 mg/dL (ref 70–99)
Glucose-Capillary: 99 mg/dL (ref 70–99)

## 2022-03-19 MED ORDER — IRBESARTAN 300 MG PO TABS
300.0000 mg | ORAL_TABLET | Freq: Every day | ORAL | Status: DC
  Administered 2022-03-19: 300 mg via ORAL
  Filled 2022-03-19 (×2): qty 1

## 2022-03-19 MED ORDER — LIDOCAINE-PRILOCAINE 2.5-2.5 % EX CREA: 1.0000 | TOPICAL_CREAM | CUTANEOUS | Status: DC | PRN

## 2022-03-19 MED ORDER — CINACALCET HCL 30 MG PO TABS: 60.0000 mg | ORAL_TABLET | Freq: Every day | ORAL | Status: DC

## 2022-03-19 MED ORDER — LIDOCAINE HCL (PF) 1 % IJ SOLN: 5.0000 mL | INTRAMUSCULAR | Status: DC | PRN

## 2022-03-19 MED ORDER — CLONIDINE HCL 0.2 MG PO TABS
0.3000 mg | ORAL_TABLET | Freq: Three times a day (TID) | ORAL | Status: DC
  Administered 2022-03-19 – 2022-03-20 (×4): 0.3 mg via ORAL
  Filled 2022-03-19 (×5): qty 1

## 2022-03-19 MED ORDER — HYDRALAZINE HCL 50 MG PO TABS
100.0000 mg | ORAL_TABLET | Freq: Three times a day (TID) | ORAL | Status: DC
  Administered 2022-03-19 – 2022-03-20 (×4): 100 mg via ORAL
  Filled 2022-03-19 (×5): qty 2

## 2022-03-19 MED ORDER — CARVEDILOL 25 MG PO TABS
25.0000 mg | ORAL_TABLET | Freq: Two times a day (BID) | ORAL | Status: DC
  Administered 2022-03-19 – 2022-03-20 (×3): 25 mg via ORAL
  Filled 2022-03-19 (×4): qty 1

## 2022-03-19 MED ORDER — CINACALCET HCL 30 MG PO TABS
60.0000 mg | ORAL_TABLET | Freq: Every day | ORAL | Status: DC
  Administered 2022-03-19: 60 mg via ORAL
  Filled 2022-03-19: qty 2

## 2022-03-19 MED ORDER — ACETAMINOPHEN 500 MG PO TABS
1000.0000 mg | ORAL_TABLET | Freq: Four times a day (QID) | ORAL | Status: DC | PRN
  Administered 2022-03-19: 1000 mg via ORAL
  Filled 2022-03-19: qty 2

## 2022-03-19 MED ORDER — ACETAMINOPHEN 650 MG RE SUPP: 650.0000 mg | Freq: Four times a day (QID) | RECTAL | Status: DC | PRN

## 2022-03-19 MED ORDER — PENTAFLUOROPROP-TETRAFLUOROETH EX AERO: 1.0000 | INHALATION_SPRAY | CUTANEOUS | Status: DC | PRN

## 2022-03-19 MED ORDER — NIFEDIPINE ER OSMOTIC RELEASE 60 MG PO TB24
60.0000 mg | ORAL_TABLET | Freq: Two times a day (BID) | ORAL | Status: DC
  Administered 2022-03-19 – 2022-03-20 (×3): 60 mg via ORAL
  Filled 2022-03-19 (×3): qty 1

## 2022-03-19 NOTE — Progress Notes (Signed)
Called pharmacy and sent a message regarding pt bp meds that Dr. Geraldine Solar her to take her in hemodialysis. They told me they will send a message to the pharmacist from her home floor and get the meds to me.

## 2022-03-19 NOTE — Consult Note (Addendum)
Renal Service Consult Note [facility]  ARDENA Bell 03/19/2022 [name], MD Requesting Physician: Dr. [place]  Reason for Consult: ESRD pt w/ GI bleed HPI: The patient is a 62 y.o. year-old w/ PMH as below who presented to ED w/ [facility] yesterday 2.06.  Hx of GIB in past. No abd pain. Had some chest pain as well. In ED +epigastric pain. Hb 11-12. Pt admitted, poss diverticular bleed. GI consulted. We are asked to see for ESRD.    Pt seen in HD. States she has severe HTN and has to take all her BP meds regardless of HD timing. Requests she get her am BPs now as her BP is climbing already on HD as it usually does per the patient.    Pt gets HD in [facility] w[facility].  Denies any missed HD, SOB, leg swelling, access issues.    ROS - denies CP, no joint pain, no HA, no blurry vision, no rash, no diarrhea, no nausea/ vomiting, no dysuria, no difficulty voiding   Past Medical History  Past Medical History:  Diagnosis Date   Anemia of chronic disease    Anxiety    Arthritis    oa and psoriatic ra   Asthma    Back pain, chronic    lower back   Chronic insomnia    COPD (chronic obstructive pulmonary disease) ([place])    Diabetes mellitus    type 2   ESRD on dialysis [facility])    Essential hypertension    GERD (gastroesophageal reflux disease)    Gout    History of hiatal hernia    Hypoalbuminemia    Hyponatremia    Mild aortic stenosis    Mitral regurgitation    Mitral stenosis    Obesity    PAD (peripheral artery disease) ([place])    a. externial iliac calcification seen on CT 04/2020.   Sleep apnea    Tobacco abuse    Upper GI bleed 05/2019   Past Surgical History  Past Surgical History:  Procedure Laterality Date   BACK SURGERY  2016   lower back fusion with cage   BIOPSY  09/23/2017   Procedure: BIOPSY;  Surgeon: Wonda Horner, MD;  Location: [facility];  Service: Endoscopy;;   BIOPSY  05/19/2019   Procedure: BIOPSY;  Surgeon: Otis Brace, MD;  Location: [place];  Service: Gastroenterology;;   CESAREAN SECTION  1990   x 1    COLONOSCOPY WITH PROPOFOL N/A 09/23/2017   Procedure: COLONOSCOPY WITH PROPOFOL Hemostatic clips placed;  Surgeon: Wonda Horner, MD;  [facility]: [facility];  Service: Endoscopy;  Laterality: N/A;   COLONOSCOPY WITH PROPOFOL N/A 10/02/2020   Procedure: COLONOSCOPY WITH PROPOFOL;  Surgeon: Arta Silence, MD;  Location: [place];  Service: Endoscopy;  Laterality: N/A;   CORONARY ARTERY BYPASS GRAFT N/A 09/13/2020   Procedure: CORONARY ARTERY BYPASS GRAFTING (CABG), ON PUMP, TIMES TWO, USING LEFT INTERNAL MAMMARY ARTERY AND RIGHT ENDOSCOPICALLY HARVESTED GREATER SAPHENOUS VEIN;  Surgeon: Gaye Pollack, MD;  Location: [place];  Service: Open Heart Surgery;  Laterality: N/A;   DILATION AND CURETTAGE OF UTERUS  1988   ENTEROSCOPY N/A 07/22/2021   Procedure: ENTEROSCOPY;  Surgeon: Ronnette Juniper, MD;  Location: [place];  Service: Gastroenterology;  Laterality: N/A;   ESOPHAGOGASTRODUODENOSCOPY N/A 09/22/2020   Procedure: ESOPHAGOGASTRODUODENOSCOPY (EGD);  Surgeon: Arta Silence, MD;  Location: [facility] ENDOSCOPY;  Service: Endoscopy;  Laterality: N/A;   ESOPHAGOGASTRODUODENOSCOPY (EGD) WITH PROPOFOL N/A 09/23/2017  Procedure: ESOPHAGOGASTRODUODENOSCOPY (EGD) WITH PROPOFOL;  Surgeon: Wonda Horner, MD;  [facility];  Service: Endoscopy;  Laterality: N/A;   ESOPHAGOGASTRODUODENOSCOPY (EGD) WITH PROPOFOL N/A 05/19/2019   Procedure: ESOPHAGOGASTRODUODENOSCOPY (EGD) WITH PROPOFOL;  Surgeon: Otis Brace, MD;  Location: [place];  Service: Gastroenterology;  Laterality: N/A;   ESOPHAGOGASTRODUODENOSCOPY (EGD) WITH PROPOFOL N/A 10/02/2020   Procedure: ESOPHAGOGASTRODUODENOSCOPY (EGD) WITH PROPOFOL;  Surgeon: Arta Silence, MD;  Location: [place];  Service: Endoscopy;  Laterality: N/A;   GIVENS CAPSULE STUDY N/A 05/19/2019   Procedure: GIVENS CAPSULE STUDY;  Surgeon: Otis Brace, MD;   Location: [place];  Service: Gastroenterology;  Laterality: N/A;   GIVENS CAPSULE STUDY N/A 07/19/2021   Procedure: GIVENS CAPSULE STUDY;  Surgeon: Clarene Essex, MD;  Location: [place];  Service: Gastroenterology;  Laterality: N/A;   HEMOSTASIS CLIP PLACEMENT  09/22/2020   Procedure: HEMOSTASIS CLIP PLACEMENT;  Surgeon: Arta Silence, MD;  Location: [place];  Service: Endoscopy;;   HEMOSTASIS CONTROL  09/22/2020   Procedure: HEMOSTASIS CONTROL;  Surgeon: Arta Silence, MD;  Location: [place];  Service: Endoscopy;;   HOT HEMOSTASIS N/A 05/19/2019   Procedure: HOT HEMOSTASIS (ARGON PLASMA COAGULATION/BICAP);  Surgeon: Otis Brace, MD;  Location: [facility] END[facility]COPY;  Service: Gastroenterology;  Laterality: N/A;   LEFT HEART CATH AND CORONARY ANGIOGRAPHY N/A 09/10/2020   Procedure: LEFT HEART CATH AND CORONARY ANGIOGRAPHY;  Surgeon: Nelva Bush, MD;  Location: [place] CV LAB;  Service: Cardiovascular;  Laterality: N/A;   PARTIAL KNEE ARTHROPLASTY Left 11/12/2017   Procedure: left unicompartmental arthroplasty-medial;  Surgeon: Paralee Cancel, MD;  [facility];  Service: Orthopedics;  Laterality: Left;  11min   SUBMUCOSAL INJECTION  09/23/2017   Procedure: SUBMUCOSAL INJECTION;  Surgeon: Wonda Horner, MD;  [facility];  Service: Endoscopy;;  in colon   TEE WITHOUT CARDIOVERSION N/A 09/13/2020   Procedure: TRANSESOPHAGEAL ECHOCARDIOGRAM (TEE);  Surgeon: Gaye Pollack, MD;  Location: [place];  Service: Open Heart Surgery;  Laterality: N/A;   TUBAL LIGATION  1990   Family History  Family History  Problem Relation Age of Onset   Lung cancer Mother    Diabetes Sister    Heart disease Sister    Asthma Daughter    Arthritis Daughter    Arthritis Daughter    Thyroid cancer Other        1/2 sister   Heart disease Other        1/2 sister   Social History  reports that she quit smoking about 19 months ago. Her smoking use included cigarettes. She has a 20.00  pack-year smoking history. She has never used smokeless tobacco. She reports current alcohol use. She reports that she does not use drugs. Allergies  Allergies  Allergen Reactions   3-Methyl-2-Benzothiazolinone Hydrazone Other (See Comments)    Muscle weakness muscle cramping in legs Other reaction(s): Myalgias (Muscle Pain)   Banana Anaphylaxis, Swelling and Other (See Comments)    TONGUE AND MOUTH SWELLING   Black Walnut Flavor Anaphylaxis and Itching    Walnuts   Hazelnut ([place]) Allergy Skin Test Anaphylaxis    Hazelnuts   Leflunomide And Related Other (See Comments)    Severe Headache   Lisinopril Swelling    Angioedema  Swelling of the face   No [employer] Food Allergies Anaphylaxis    Spicy Mustard 4.7.2021 Pt reports that she eats Regular Yellow Mustard Swelling and itching of tongue   Other Other (See Comments), Anaphylaxis and Swelling    Cosentyx= angioedema  Other reaction(s):  Facial Edema (intolerance) Mouth itches and tongue swelling Hazel nuts and pecans also Walnuts Walnuts Renal failure Other reaction(s): Other Serotonin Syndrome  Serotonin syndrome  Tremors Other reaction(s): Other Tremors Muscle weakness muscle cramping in legs Other reaction(s): Myalgias (Muscle Pain) Mouth itches and tongue swelling Hazel nuts and pecans also Walnuts    Pecan Extract Allergy Skin Test Anaphylaxis and Itching    Pecans    Pecan Nut (Diagnostic) Anaphylaxis and Itching    Pecans   Trazodone And Nefazodone Other (See Comments)    Serotonin Syndrome  Tremors   Adalimumab Other (See Comments)    Blisters on abdomen =humira   Escitalopram Oxalate Other (See Comments)    Hand problems - Serotonin Syndrome     Ezetimibe Other (See Comments)    myalgias cramps    Secukinumab Swelling    Cosentyx = angiodema    Statins Other (See Comments)    Leg pains and weakness   Ferrlecit [Na Ferric Gluc Cplx In Sucrose]     Not reaction given from HD    Gabapentin Other (See Comments)    tremors   Ibuprofen Other (See Comments)    Renal Problems   Duloxetine Rash   Home medications Prior to Admission medications   Medication Sig Start Date End Date Taking? Authorizing Provider  acetaminophen (TYLENOL) 500 MG tablet Take 1,000 mg by mouth daily as needed (pain).   Yes [provider]  albuterol (PROVENTIL) (2.5 MG/3ML) 0.083% nebulizer solution Take 3 mLs (2.5 mg total) by nebulization every 6 (six) hours as needed for wheezing or shortness of breath. 07/10/21  Yes Hunsucker, Bonna Gains, MD  allopurinol (ZYLOPRIM) 100 MG tablet Take 100 mg by mouth every evening.   Yes [provider]  carboxymethylcellulose 1 % ophthalmic solution Place 2 drops into both eyes 2 (two) times daily as needed (dry eyes).   Yes [provider]  carvedilol (COREG) 25 MG tablet Take 25 mg by mouth 2 (two) times daily with a meal.   Yes [provider]  cetirizine (ZYRTEC) 10 MG tablet Take 10 mg by mouth daily as needed for allergies.   Yes [provider]  cholecalciferol (VITAMIN D) 25 MCG tablet Take 1 tablet (1,000 Units total) by mouth every evening. Patient taking differently: Take 1,000 Units by mouth See admin instructions. Take one tablet by mouth on Monday Wednesdays and Fridays 10/04/20  Yes Gold, Wilder Glade, PA-C  cholecalciferol (VITAMIN D3) 10 MCG (400 UNIT) TABS tablet Take 800 Units by mouth daily.   Yes [provider]  cinacalcet (SENSIPAR) 60 MG tablet Take 60 mg by mouth every evening.   Yes [provider]  cloNIDine (CATAPRES) 0.3 MG tablet Take 0.3 mg by mouth 3 (three) times daily.   Yes [provider]  cyclobenzaprine (FLEXERIL) 10 MG tablet Take 10 mg by mouth 2 (two) times daily as needed for muscle spasms.   Yes [provider]  diclofenac Sodium (VOLTAREN) 1 % GEL Apply 2 g topically daily as needed (pain).   Yes [provider]  docusate sodium (COLACE)  100 MG capsule Take 100 mg by mouth daily as needed for mild constipation.   Yes [provider]  ferrous sulfate 325 (65 FE) MG tablet Take 325 mg by mouth See admin instructions. Take 1 tablet by mouth three times a week on Tues, Thur, Sat only per patient   Yes [provider]  folic acid (FOLVITE) 505 MCG tablet Take 400 mcg by  mouth daily.   Yes [provider]  gabapentin (NEURONTIN) 300 MG capsule Take 1 capsule (300 mg total) by mouth at bedtime as needed (Neuropathic pain). Patient taking differently: Take 300 mg by mouth daily as needed (Neuropathic pain). 12/14/17 03/18/22 Yes Dana Allan I, MD  hydrALAZINE (APRESOLINE) 100 MG tablet Take 1 tablet (100 mg total) by mouth every 8 (eight) hours. Patient taking differently: Take 100 mg by mouth 3 (three) times daily. 07/06/20  Yes Cresenzo, Angelyn Punt, MD  hydrocortisone (ANUSOL-HC) 2.5 % rectal cream Place rectally 2 (two) times daily. Patient taking differently: Place 1 Application rectally daily as needed for hemorrhoids or anal itching. 07/23/21  Yes [name], MD  isosorbide mononitrate (IMDUR) 60 MG 24 hr tablet Take 1 tablet (60 mg total) by mouth daily. 08/14/21  Yes Bhagat, Bhavinkumar, PA  LORazepam (ATIVAN) 1 MG tablet Take 1 mg by mouth every 8 (eight) hours as needed for anxiety. 06/25/20  Yes [provider]  montelukast (SINGULAIR) 10 MG tablet Take 10 mg by mouth daily with supper.    Yes [provider]  NIFEdipine (PROCARDIA XL/NIFEDICAL XL) 60 MG 24 hr tablet Take 60 mg by mouth 2 (two) times daily. 10/01/21  Yes [provider]  olmesartan (BENICAR) 40 MG tablet Take 40 mg by mouth every evening. 05/06/19  Yes [provider]  ondansetron (ZOFRAN) 4 MG tablet Take 4 mg by mouth 2 (two) times daily as needed for nausea or vomiting.   Yes [provider]  OVER THE COUNTER MEDICATION Take by mouth daily as needed (sickness). Elderberry Fruit and flower 1 dropper  full by mouth daily as needed for sickness   Yes [provider]  Oxycodone HCl 10 MG TABS Take 10 mg by mouth daily as needed for pain. 01/11/21  Yes [provider]  OXYGEN Inhale 2 L into the lungs as directed.   Yes [provider]  pantoprazole (PROTONIX) 40 MG tablet Take 40 mg by mouth daily. 06/09/20  Yes [provider]  predniSONE (DELTASONE) 5 MG tablet Take 1 tablet (5 mg total) by mouth daily with breakfast. 02/27/22  Yes Nani Gasser, MD  Probiotic Product (PROBIOTIC PO) Take 1 capsule by mouth daily.   Yes [provider]  sorbitol 70 % SOLN Take 30 ml 1-2 times daily as needed for constipation Patient taking differently: Take 30 mLs by mouth daily as needed for mild constipation. 07/23/21  Yes [name], MD  Tiotropium Bromide-Olodaterol (STIOLTO RESPIMAT) 2.5-2.5 MCG/ACT AERS Inhale 2 puffs once daily into the lungs. 11/28/21  Yes Hunsucker, Bonna Gains, MD  VENTOLIN HFA 108 (90 Base) MCG/ACT inhaler Inhale 2 puffs into the lungs See admin instructions. Take 2 puffs every 4-6 hours as needed for wheezing and shortness of breath 07/10/21  Yes Hunsucker, Bonna Gains, MD  zolpidem (AMBIEN) 5 MG tablet Take 5 mg by mouth at bedtime as needed for sleep. 02/04/21  Yes [provider]  Darbepoetin Alfa (ARANESP) 200 MCG/0.4ML SOSY injection Inject 0.4 mLs (200 mcg total) into the vein every Wednesday with hemodialysis. 10/10/20   Gold, Wayne E, PA-C  heparin 1000 unit/mL SOLN injection 3 mLs (3,000 Units total) by Dialysis route as needed (in dialysis as needed). Patient not taking: Reported on 03/18/2022 10/04/20   Jadene Pierini E, PA-C  heparin 1000 unit/mL SOLN injection 2 mLs (2,000 Units total) by Dialysis route as needed (in dialysis as needed). Patient not taking: Reported on 03/18/2022 10/04/20   Jadene Pierini  E, PA-C  heparin 1000 unit/mL SOLN injection 1 mL (1,000 Units total) by Dialysis route as needed (in dialysis). Patient not taking:  Reported on 03/18/2022 10/04/20   Jadene Pierini E, PA-C  labetalol (NORMODYNE) 200 MG tablet Take 2 tablets (400 mg total) by mouth 3 (three) times daily. Patient not taking: Reported on 03/18/2022 07/11/21   Leanor Kail, PA  lidocaine (LIDODERM) 5 % Place 1 patch onto the skin every 12 (twelve) hours as needed. Remove & Discard patch within 12 hours or as directed by MD Patient not taking: Reported on 03/18/2022 07/23/21   [name], MD     Vitals:   03/18/22 2300 03/19/22 0725 03/19/22 0829 03/19/22 0859  BP: (!) 122/105  (!) 183/73 (!) 188/70  Pulse: 67 66    Resp: 18     Temp: (!) 97.3 F (36.3 C) 97.7 F (36.5 C) 97.9 F (36.6 C)   TempSrc: Oral Axillary Oral   SpO2: 97% 97%    Weight:   60.8 kg   Height:       Exam Gen alert, no distress No rash, cyanosis or gangrene Sclera anicteric, throat clear  No jvd or bruits Chest clear bilat to bases, no rales/ wheezing RRR no MRG Abd soft ntnd no mass or ascites +bs GU defer MS no joint effusions or deformity Ext no LE or UE edema, no wounds or ulcers Neuro is alert, Ox 3 , nf    LUE AVF+bruit      Home BP meds -- > carvedilol 25 bid, clonidine 0.3 tid, hydralazine 100 tid, procardia xl 60 bid, olmesartan 40 hs     OP HD: [facility] MWF  Needs updating -- > 3.5h  400/600  L AVF  Hep none  (edw 62kg sept 2023)   Assessment/ Plan: Acute GIB - w/ bloody stools, Hb 11- 12. Per pmd, GI ESRD - on HD MWF. Has not missed HD. HD today on schedule.  HTN/ volume - vol okay on exam, < 1kg up today. UF goal the same.  Severe HTN - has to take her am BP meds on HD days. Ordered as same. Will add back olmesartan since K+ now under control.  Anemia esrd - Hb 11, no esa needs.  [facility]BD ckd - CCa in range, cont home meds. Hyperkalemia - mild, repeat < 5 COPD - on chronic O2 2L at home  CAD sp CABG      Kelly Splinter  MD 03/19/2022, 9:11 AM Recent Labs  Lab 03/18/22 1947 03/19/22 0105 03/19/22 0700  HGB 12.1 11.6* 12.5   ALBUMIN 2.9*  --  2.8*  CALCIUM 9.1  --  9.0  PHOS 5.2*  --   --   CREATININE 5.70*  --  6.18*  K 5.5*  --  4.6   Inpatient medications:  allopurinol  100 mg Oral QPM   arformoterol  15 mcg Nebulization BID   And   umeclidinium bromide  1 puff Inhalation Daily   calcium acetate  1,334 mg Oral TID WC   carvedilol  25 mg Oral BID WC   Chlorhexidine Gluconate Cloth  6 each Topical Q0600   cinacalcet  60 mg Oral Q breakfast   cloNIDine  0.3 mg Oral TID   folic acid  0.5 mg Oral Daily   hydrALAZINE  100 mg Oral TID   insulin aspart  0-9 Units Subcutaneous Q4H   isosorbide mononitrate  60 mg Oral Daily   montelukast  10 mg Oral Q  supper   NIFEdipine  60 mg Oral BID   pantoprazole (PROTONIX) IV  40 mg Intravenous Q12H   predniSONE  5 mg Oral Q breakfast   sodium chloride flush  3 mL Intravenous Q12H    sodium chloride     sodium chloride, acetaminophen **OR** acetaminophen, albuterol, hydrALAZINE, HYDROcodone-acetaminophen, lidocaine (PF), lidocaine-prilocaine, LORazepam, pentafluoroprop-tetrafluoroeth, sodium chloride flush

## 2022-03-19 NOTE — Evaluation (Signed)
Physical Therapy Evaluation Patient Details Name: Jodi Bell MRN: [id number] DOB: Aug 10, 1960 Today's Date: 03/19/2022  History of Present Illness  62 y/o F admitted to [facility] on 2/6 for rectal bleeding. Pt is on 2L O2 [place] at baseline, MWF HDU schedule. Pt with previous admission to [facility] 1/16-1/17 for abdominal pain, seen by PT and at her mobility baseline at that time. PMHX: CAD s/p CABG, HTN, GI bleed, DM, OSA, ESRD, anemia.  Clinical Impression  Pt presents today functioning slightly below her mobility baseline, limited by general fatigue after dialysis this morning. Pt requiring min guard for transfers and ambulation today with use of RW, only ambulating in the room due to fatigue, vitals stable on 2L O2 [place]. Pt is modified independent with mobility at baseline with use of SPC or rollator, drives, and on 2L O2 [place], but pt reports feeling not back to her baseline, more fatigued after dialysis than she normally is. Pt will benefit from PT follow up for progression of mobility during admission. Anticipate pt will progress back to her baseline with no PT needs at discharge, however pt reporting she would like HHPT at discharge to help her maintain her mobility as she has had frequent admissions to the hospital. Acute PT will continue to follow pt as appropriate.       Recommendations for follow up therapy are one component of a multi-disciplinary discharge planning process, led by the attending physician.  Recommendations may be updated based on patient status, additional functional criteria and insurance authorization.  Follow Up Recommendations Home health PT      Assistance Recommended at Discharge Intermittent Supervision/Assistance  Patient can return home with the following  Help with stairs or ramp for entrance;Assistance with cooking/housework    Equipment Recommendations None recommended by PT  Recommendations for Other Services       Functional Status Assessment Patient has had a recent  decline in their functional status and demonstrates the ability to make significant improvements in function in a reasonable and predictable amount of time.     Precautions / Restrictions Precautions Precautions: Fall Restrictions Weight Bearing Restrictions: No      Mobility  Bed Mobility Overal bed mobility: Modified Independent             General bed mobility comments: HOB elevated and utilizing side rail but no assist needed for supine>sit, ended session with pt sitting EOB with son present and RN aware    Transfers Overall transfer level: Needs assistance Equipment used: Rolling walker (2 wheels) Transfers: Sit to/from Stand Sit to Stand: Min guard           General transfer comment: min guard provided for safety, cued for pushing up from bed    Ambulation/Gait Ambulation/Gait assistance: Min guard Gait Distance (Feet): 20 Feet Assistive device: Rolling walker (2 wheels) Gait Pattern/deviations: Step-through pattern, Decreased stride length Gait velocity: decreased     General Gait Details: pt reports feeling fatigued with mobility, requesting room ambulation with use of RW today. No LOB noted but minguard provided for safety  Stairs            Wheelchair Mobility    Modified Rankin (Stroke Patients Only)       Balance Overall balance assessment: Needs assistance Sitting-balance support: No upper extremity supported, Feet supported Sitting balance-Leahy Scale: Normal Sitting balance - Comments: stable balance while seated EOB, donning socks with no issues   Standing balance support: Bilateral upper extremity supported, During functional activity Standing balance-Leahy Scale:  Fair Standing balance comment: no LOB, utilizing RW for ambulation                             Pertinent Vitals/Pain Pain Assessment Pain Assessment: No/denies pain    Home Living Family/patient expects to be discharged to:: Private residence Living  Arrangements: Children Available Help at Discharge: Family;Available PRN/intermittently Type of Home: House Home Access: Level entry     Alternate Level Stairs-Number of Steps: 13 (kitchen upstairs) Home Layout: Multi-level;Able to live on main level with bedroom/bathroom Home Equipment: Cane - single point;Rolling Walker (2 wheels);Rollator (4 wheels);Shower seat;Toilet riser;Grab bars - tub/shower;Hand held shower head Additional Comments: pt resides with her daughter, son in law, and grandchildren, left alone for only a few hours/day and family able to provide assistance if needed when around. Pt able to stay on main level but occasionally goes upstairs which is where her kitchen is    Prior Function Prior Level of Function : Independent/Modified Independent;Driving             Mobility Comments: Pt ambulates in her home without an AD, except for when negotiating stairs in which she utilizes her [facility], rollator used for community distances and [facility] used for short distances outside       Hand Dominance   Dominant Hand: Right    Extremity/Trunk Assessment   Upper Extremity Assessment Upper Extremity Assessment: Defer to OT evaluation    Lower Extremity Assessment Lower Extremity Assessment: Overall WFL for tasks assessed    Cervical / Trunk Assessment Cervical / Trunk Assessment: Normal  Communication   Communication: No difficulties  Cognition Arousal/Alertness: Awake/alert Behavior During Therapy: WFL for tasks assessed/performed Overall Cognitive Status: Within Functional Limits for tasks assessed                                 General Comments: A&Ox4, son entering during session with no concerns about cognition        General Comments General comments (skin integrity, edema, etc.): VSS on 2L O2 [place], pt with personal SPO2 monitor she prefers to use, maintaining ~95% throughout mobility    Exercises     Assessment/Plan    PT Assessment Patient  needs continued PT services  PT Problem List Decreased activity tolerance;Decreased mobility       PT Treatment Interventions Gait training;Stair training;Functional mobility training;Therapeutic activities;Balance training;DME instruction;Therapeutic exercise;Neuromuscular re-education;Patient/family education    PT Goals (Current goals can be found in the Care Plan section)  Acute Rehab PT Goals Patient Stated Goal: get better and go home PT Goal Formulation: With patient Time For Goal Achievement: 04/02/22 Potential to Achieve Goals: Good    Frequency Min 3X/week     Co-evaluation               AM-PAC PT "6 Clicks" Mobility  Outcome Measure Help needed turning from your back to your side while in a flat bed without using bedrails?: None Help needed moving from lying on your back to sitting on the side of a flat bed without using bedrails?: None Help needed moving to and from a bed to a chair (including a wheelchair)?: A Little Help needed standing up from a chair using your arms (e.g., wheelchair or bedside chair)?: A Little Help needed to walk in hospital room?: A Little Help needed climbing 3-5 steps with a railing? : A Little 6 Click Score:  20    End of Session Equipment Utilized During Treatment: Oxygen (2L O2 [place]) Activity Tolerance: Patient tolerated treatment well Patient left: in bed;with call bell/phone within reach;with family/visitor present Nurse Communication: Mobility status PT Visit Diagnosis: Difficulty in walking, not elsewhere classified (R26.2)    Time: 2883-3744 PT Time Calculation (min) (ACUTE ONLY): 16 min   Charges:   PT Evaluation $PT Eval Low Complexity: 1 Low          Charlynne Cousins, PT DPT Acute Rehabilitation Services Office [phone]   Luvenia Heller 03/19/2022, 3:53 PM

## 2022-03-19 NOTE — Progress Notes (Signed)
   03/19/22 0800  OT Visit Information  Last OT Received On 03/19/22  Reason Eval/Treat Not Completed Patient at procedure or test/ unavailable (HD)           Plan to reattempt at a later time/date.  Tyrone Schimke, [facility] Office: [phone]

## 2022-03-19 NOTE — Progress Notes (Signed)
Received patient in bed to unit.  Alert and oriented.  Informed consent signed and in chart.   [place] duration:3  Patient tolerated well.  Transported back to the room  Alert, without acute distress.  Hand-off given to patient's nurse.   Access used: left AVF Access issues: none  Total UF removed: 1.5L Medication(s) given: tylenol, clonididne, coreg, hydralizine, procardia    03/19/22 1153  Vitals  Temp (!) 97.5 F (36.4 C)  Temp Source Oral  BP (!) 214/76  MAP (mmHg) 109  BP Location Right Arm  BP Method Automatic  Patient Position (if appropriate) Lying  Pulse Rate Source Monitor  Oxygen Therapy  O2 Device Nasal Cannula  O2 Flow Rate (L/min) 2 L/min  During Treatment Monitoring  Blood Flow Rate (mL/min) 350 mL/min  Arterial Pressure (mmHg) -10 mmHg  Venous Pressure (mmHg) 230 mmHg  TMP (mmHg) 6 mmHg  Ultrafiltration Rate (mL/min) 630 mL/min  Dialysate Flow Rate (mL/min) 300 ml/min  HD Safety Checks Performed Yes  Intra-Hemodialysis Comments Tx completed  Post Treatment  Dialyzer Clearance Lightly streaked  Duration of HD Treatment -hour(s) 3 hour(s)  Liters Processed 63  Fluid Removed (mL) 1500 mL  Tolerated HD Treatment Yes  AVG/AVF Arterial Site Held (minutes) 5 minutes  AVG/AVF Venous Site Held (minutes) 5 minutes      Stephanine Reas [facility]

## 2022-03-19 NOTE — Plan of Care (Signed)

## 2022-03-19 NOTE — Progress Notes (Signed)
Pt receives out-pt HD at [facility] on MWF. Will assist as needed.   Melven Sartorius Renal Navigator [phone]

## 2022-03-19 NOTE — Consult Note (Signed)
Referring Provider: [facility] Primary Care Physician:  [name]., MD Primary Gastroenterologist:  Dr. Watt Climes (last seen 02/20/2022)  Reason for Consultation:  BRBPR  HPI: Jodi Bell is a 62 y.o. female medical history significant of COPD on 2 L of oxygen, end-stage renal disease on hemodialysis Monday Wednesday Friday, CAD, uncontrolled hypertension, GI bleed in the past, Dm 2, OSA presents for bright red blood per rectum.  2-day history of blood in stools.  History of GI bleeding in the past. History of endoscopy 05/19/2019 which showed acute esophagitis.  Capsule endoscopy at this time showed no active bleeding.  EGD/colonoscopy 10/02/2020 done for hematochezia and anemia with Dr. Paulita Fujita as an inpatient: Moderate ulceration over internal hemorrhoids suspect because of recent hematochezia, diverticulosis throughout colon, no active or old blood seen EGD showed normal esophagus.  Old hemoclips in stomach.  Normal duodenum.  No source of bleeding  Patient underwent repeat small bowel enteroscopy 07/22/2021 which showed mid small bowel fresh blood and second area of bleeding in distal small bowel with old blood in colon.  Mild gastritis.  CT abdomen pelvis with contrast 02/25/2022 done for LLQ pain shows small volume abdominal ascites along with mesenteric edema and diffuse body wall edema, diverticulosis, stable large vascular clips in stomach related to prior GI bleed.  Patient states she presented to ED for bright red rectal bleeding with bowel movements. States Sunday (2/4) she had 1 episode, Monday she had 4 episodes, and last night she had 3 episodes. Each episode occurs with a bowel movement and is about 1/2 cup bright red blood (demonstrated picture to me). Denies abdominal pain. Denies nausea/vomiting. Denies weakness, chest pain or shortness of breath.  Past Medical History:  Diagnosis Date   Anemia of chronic disease    Anxiety    Arthritis    oa and psoriatic ra   Asthma    Back  pain, chronic    lower back   Chronic insomnia    COPD (chronic obstructive pulmonary disease) (HCC)    Diabetes mellitus    type 2   ESRD on dialysis [facility])    Essential hypertension    GERD (gastroesophageal reflux disease)    Gout    History of hiatal hernia    Hypoalbuminemia    Hyponatremia    Mild aortic stenosis    Mitral regurgitation    Mitral stenosis    Obesity    PAD (peripheral artery disease) ([place])    a. externial iliac calcification seen on CT 04/2020.   Sleep apnea    Tobacco abuse    Upper GI bleed 05/2019    Past Surgical History:  Procedure Laterality Date   BACK SURGERY  2016   lower back fusion with cage   BIOPSY  09/23/2017   Procedure: BIOPSY;  Surgeon: Wonda Horner, MD;  Location: [facility];  Service: Endoscopy;;   BIOPSY  05/19/2019   Procedure: BIOPSY;  Surgeon: Otis Brace, MD;  Location: [place];  Service: Gastroenterology;;   CESAREAN SECTION  1990   x 1    COLONOSCOPY WITH PROPOFOL N/A 09/23/2017   Procedure: COLONOSCOPY WITH PROPOFOL Hemostatic clips placed;  Surgeon: Wonda Horner, MD;  Location: [facility];  Service: Endoscopy;  Laterality: N/A;   COLONOSCOPY WITH PROPOFOL N/A 10/02/2020   Procedure: COLONOSCOPY WITH PROPOFOL;  Surgeon: Arta Silence, MD;  Location: [place];  Service: Endoscopy;  Laterality: N/A;   CORONARY ARTERY BYPASS GRAFT N/A 09/13/2020   Procedure: CORONARY ARTERY BYPASS GRAFTING (CABG),  ON PUMP, TIMES TWO, USING LEFT INTERNAL MAMMARY ARTERY AND RIGHT ENDOSCOPICALLY HARVESTED GREATER SAPHENOUS VEIN;  Surgeon: Gaye Pollack, MD;  Location: [place];  Service: Open Heart Surgery;  Laterality: N/A;   DILATION AND CURETTAGE OF UTERUS  1988   ENTEROSCOPY N/A 07/22/2021   Procedure: ENTEROSCOPY;  Surgeon: Ronnette Juniper, MD;  Location: [place];  Service: Gastroenterology;  Laterality: N/A;   ESOPHAGOGASTRODUODENOSCOPY N/A 09/22/2020   Procedure: ESOPHAGOGASTRODUODENOSCOPY (EGD);  Surgeon: Arta Silence,  MD;  Location: [facility];  Service: Endoscopy;  Laterality: N/A;   ESOPHAGOGASTRODUODENOSCOPY (EGD) WITH PROPOFOL N/A 09/23/2017   Procedure: ESOPHAGOGASTRODUODENOSCOPY (EGD) WITH PROPOFOL;  Surgeon: Wonda Horner, MD;  [facility]: [facility];  Service: Endoscopy;  Laterality: N/A;   ESOPHAGOGASTRODUODENOSCOPY (EGD) WITH PROPOFOL N/A 05/19/2019   Procedure: ESOPHAGOGASTRODUODENOSCOPY (EGD) WITH PROPOFOL;  Surgeon: Otis Brace, MD;  Location: [place];  Service: Gastroenterology;  Laterality: N/A;   ESOPHAGOGASTRODUODENOSCOPY (EGD) WITH PROPOFOL N/A 10/02/2020   Procedure: ESOPHAGOGASTRODUODENOSCOPY (EGD) WITH PROPOFOL;  Surgeon: Arta Silence, MD;  Location: [place];  Service: Endoscopy;  Laterality: N/A;   GIVENS CAPSULE STUDY N/A 05/19/2019   Procedure: GIVENS CAPSULE STUDY;  Surgeon: Otis Brace, MD;  Location: [place];  Service: Gastroenterology;  Laterality: N/A;   GIVENS CAPSULE STUDY N/A 07/19/2021   Procedure: GIVENS CAPSULE STUDY;  Surgeon: Clarene Essex, MD;  Location: [place];  Service: Gastroenterology;  Laterality: N/A;   HEMOSTASIS CLIP PLACEMENT  09/22/2020   Procedure: HEMOSTASIS CLIP PLACEMENT;  Surgeon: Arta Silence, MD;  Location: [place];  Service: Endoscopy;;   HEMOSTASIS CONTROL  09/22/2020   Procedure: HEMOSTASIS CONTROL;  Surgeon: Arta Silence, MD;  Location: [place];  Service: Endoscopy;;   HOT HEMOSTASIS N/A 05/19/2019   Procedure: HOT HEMOSTASIS (ARGON PLASMA COAGULATION/BICAP);  Surgeon: Otis Brace, MD;  Location: [facility];  Service: Gastroenterology;  Laterality: N/A;   LEFT HEART CATH AND CORONARY ANGIOGRAPHY N/A 09/10/2020   Procedure: LEFT HEART CATH AND CORONARY ANGIOGRAPHY;  Surgeon: Nelva Bush, MD;  Location: [place] [facility];  Service: Cardiovascular;  Laterality: N/A;   PARTIAL KNEE ARTHROPLASTY Left 11/12/2017   Procedure: left unicompartmental arthroplasty-medial;  Surgeon: Paralee Cancel, MD;  Location:  [facility];  Service: Orthopedics;  Laterality: Left;  2min   SUBMUCOSAL INJECTION  09/23/2017   Procedure: SUBMUCOSAL INJECTION;  Surgeon: Wonda Horner, MD;  Location: [facility];  Service: Endoscopy;;  in colon   TEE WITHOUT CARDIOVERSION N/A 09/13/2020   Procedure: TRANSESOPHAGEAL ECHOCARDIOGRAM (TEE);  Surgeon: Gaye Pollack, MD;  Location: [place];  Service: Open Heart Surgery;  Laterality: N/A;   TUBAL LIGATION  1990    Prior to Admission medications   Medication Sig Start Date End Date Taking? Authorizing Provider  acetaminophen (TYLENOL) 500 MG tablet Take 1,000 mg by mouth daily as needed (pain).   Yes [provider]  albuterol (PROVENTIL) (2.5 MG/3ML) 0.083% nebulizer solution Take 3 mLs (2.5 mg total) by nebulization every 6 (six) hours as needed for wheezing or shortness of breath. 07/10/21  Yes Hunsucker, Bonna Gains, MD  allopurinol (ZYLOPRIM) 100 MG tablet Take 100 mg by mouth every evening.   Yes [provider]  carboxymethylcellulose 1 % ophthalmic solution Place 2 drops into both eyes 2 (two) times daily as needed (dry eyes).   Yes [provider]  carvedilol (COREG) 25 MG tablet Take 25 mg by mouth 2 (two) times daily with a meal.   Yes [provider]  cetirizine (ZYRTEC) 10 MG tablet Take 10 mg  by mouth daily as needed for allergies.   Yes [provider]  cholecalciferol (VITAMIN D) 25 MCG tablet Take 1 tablet (1,000 Units total) by mouth every evening. Patient taking differently: Take 1,000 Units by mouth See admin instructions. Take one tablet by mouth on Monday Wednesdays and Fridays 10/04/20  Yes Gold, Wilder Glade, PA-C  cholecalciferol (VITAMIN D3) 10 MCG (400 UNIT) TABS tablet Take 800 Units by mouth daily.   Yes [provider]  cinacalcet (SENSIPAR) 60 MG tablet Take 60 mg by mouth every evening.   Yes [provider]  cloNIDine (CATAPRES) 0.3 MG tablet Take 0.3 mg by mouth 3 (three) times daily.   Yes [provider]  cyclobenzaprine (FLEXERIL) 10 MG tablet Take 10 mg by mouth 2 (two) times daily as needed for muscle spasms.   Yes [provider]  diclofenac Sodium (VOLTAREN) 1 % GEL Apply 2 g topically daily as needed (pain).   Yes [provider]  docusate sodium (COLACE) 100 MG capsule Take 100 mg by mouth daily as needed for mild constipation.   Yes [provider]  ferrous sulfate 325 (65 FE) MG tablet Take 325 mg by mouth See admin instructions. Take 1 tablet by mouth three times a week on Tues, Thur, Sat only per patient   Yes [provider]  folic acid (FOLVITE) 245 MCG tablet Take 400 mcg by mouth daily.   Yes [provider]  gabapentin (NEURONTIN) 300 MG capsule Take 1 capsule (300 mg total) by mouth at bedtime as needed (Neuropathic pain). Patient taking differently: Take 300 mg by mouth daily as needed (Neuropathic pain). 12/14/17 03/18/22 Yes Dana Allan I, MD  hydrALAZINE (APRESOLINE) 100 MG tablet Take 1 tablet (100 mg total) by mouth every 8 (eight) hours. Patient taking differently: Take 100 mg by mouth 3 (three) times daily. 07/06/20  Yes Cresenzo, Angelyn Punt, MD  hydrocortisone (ANUSOL-HC) 2.5 % rectal cream Place rectally 2 (two) times daily. Patient taking differently: Place 1 Application rectally daily as needed for hemorrhoids or anal itching. 07/23/21  Yes [name], MD  isosorbide mononitrate (IMDUR) 60 MG 24 hr tablet Take 1 tablet (60 mg total) by mouth daily. 08/14/21  Yes Bhagat, Bhavinkumar, PA  LORazepam (ATIVAN) 1 MG tablet Take 1 mg by mouth every 8 (eight) hours as needed for anxiety. 06/25/20  Yes [provider]  montelukast (SINGULAIR) 10 MG tablet Take 10 mg by mouth daily with supper.    Yes [provider]  NIFEdipine (PROCARDIA XL/NIFEDICAL XL) 60 MG 24 hr tablet Take 60 mg by mouth 2 (two) times daily. 10/01/21  Yes [provider]  olmesartan (BENICAR) 40 MG tablet Take 40 mg by mouth  every evening. 05/06/19  Yes [provider]  ondansetron (ZOFRAN) 4 MG tablet Take 4 mg by mouth 2 (two) times daily as needed for nausea or vomiting.   Yes [provider]  OVER THE COUNTER MEDICATION Take by mouth daily as needed (sickness). Elderberry Fruit and flower 1 dropper full by mouth daily as needed for sickness   Yes [provider]  Oxycodone HCl 10 MG TABS Take 10 mg by mouth daily as needed for pain. 01/11/21  Yes [provider]  OXYGEN Inhale 2 L into the lungs as directed.   Yes [provider]  pantoprazole (PROTONIX) 40 MG tablet Take 40 mg by mouth daily. 06/09/20  Yes [provider]  predniSONE (DELTASONE) 5 MG tablet Take 1 tablet (5  mg total) by mouth daily with breakfast. 02/27/22  Yes Nani Gasser, MD  Probiotic Product (PROBIOTIC PO) Take 1 capsule by mouth daily.   Yes [provider]  sorbitol 70 % SOLN Take 30 ml 1-2 times daily as needed for constipation Patient taking differently: Take 30 mLs by mouth daily as needed for mild constipation. 07/23/21  Yes [name], MD  Tiotropium Bromide-Olodaterol (STIOLTO RESPIMAT) 2.5-2.5 MCG/ACT AERS Inhale 2 puffs once daily into the lungs. 11/28/21  Yes Hunsucker, Bonna Gains, MD  VENTOLIN HFA 108 (90 Base) MCG/ACT inhaler Inhale 2 puffs into the lungs See admin instructions. Take 2 puffs every 4-6 hours as needed for wheezing and shortness of breath 07/10/21  Yes Hunsucker, Bonna Gains, MD  zolpidem (AMBIEN) 5 MG tablet Take 5 mg by mouth at bedtime as needed for sleep. 02/04/21  Yes [provider]  Darbepoetin Alfa (ARANESP) 200 MCG/0.4ML SOSY injection Inject 0.4 mLs (200 mcg total) into the vein every Wednesday with hemodialysis. 10/10/20   Gold, Wayne E, PA-C  heparin 1000 unit/mL SOLN injection 3 mLs (3,000 Units total) by Dialysis route as needed (in dialysis as needed). Patient not taking: Reported on 03/18/2022 10/04/20   Jadene Pierini E, PA-C  heparin 1000  unit/mL SOLN injection 2 mLs (2,000 Units total) by Dialysis route as needed (in dialysis as needed). Patient not taking: Reported on 03/18/2022 10/04/20   Jadene Pierini E, PA-C  heparin 1000 unit/mL SOLN injection 1 mL (1,000 Units total) by Dialysis route as needed (in dialysis). Patient not taking: Reported on 03/18/2022 10/04/20   Jadene Pierini E, PA-C  labetalol (NORMODYNE) 200 MG tablet Take 2 tablets (400 mg total) by mouth 3 (three) times daily. Patient not taking: Reported on 03/18/2022 07/11/21   Leanor Kail, PA  lidocaine (LIDODERM) 5 % Place 1 patch onto the skin every 12 (twelve) hours as needed. Remove & Discard patch within 12 hours or as directed by MD Patient not taking: Reported on 03/18/2022 07/23/21   [name], MD    Scheduled Meds:  allopurinol  100 mg Oral QPM   arformoterol  15 mcg Nebulization BID   And   umeclidinium bromide  1 puff Inhalation Daily   calcium acetate  1,334 mg Oral TID WC   carvedilol  25 mg Oral BID WC   Chlorhexidine Gluconate Cloth  6 each Topical Q0600   cinacalcet  60 mg Oral Q breakfast   cloNIDine  0.3 mg Oral TID   folic acid  0.5 mg Oral Daily   hydrALAZINE  100 mg Oral TID   insulin aspart  0-9 Units Subcutaneous Q4H   irbesartan  300 mg Oral QPC supper   isosorbide mononitrate  60 mg Oral Daily   montelukast  10 mg Oral Q supper   NIFEdipine  60 mg Oral BID   pantoprazole (PROTONIX) IV  40 mg Intravenous Q12H   predniSONE  5 mg Oral Q breakfast   sodium chloride flush  3 mL Intravenous Q12H   Continuous Infusions:  sodium chloride     PRN Meds:.sodium chloride, acetaminophen **OR** acetaminophen, albuterol, hydrALAZINE, HYDROcodone-acetaminophen, lidocaine (PF), lidocaine-prilocaine, LORazepam, pentafluoroprop-tetrafluoroeth, sodium chloride flush  Allergies as of 03/18/2022 - Review Complete 03/18/2022  Allergen Reaction Noted   3-methyl-2-benzothiazolinone hydrazone Other (See Comments) 09/07/2012   Banana Anaphylaxis, Swelling,  and Other (See Comments) 01/21/2018   Black walnut flavor Anaphylaxis and Itching 05/18/2019   Hazelnut (filbert) allergy skin test Anaphylaxis 05/18/2019   Leflunomide and related Other (See Comments)  07/23/2012   Lisinopril Swelling 01/05/2014   No healthtouch food allergies Anaphylaxis 05/18/2019   Other Other (See Comments), Anaphylaxis, and Swelling 09/07/2012   Pecan extract allergy skin test Anaphylaxis and Itching 05/18/2019   Pecan nut (diagnostic) Anaphylaxis and Itching 05/18/2019   Trazodone and nefazodone Other (See Comments) 07/23/2014   Adalimumab Other (See Comments) 07/23/2012   Escitalopram oxalate Other (See Comments) 09/29/2013   Ezetimibe Other (See Comments) 01/13/2018   Secukinumab Swelling 05/18/2019   Statins Other (See Comments) 09/04/2014   Ferrlecit [na ferric gluc cplx in sucrose]  09/19/2020   Gabapentin Other (See Comments) 06/26/2021   Ibuprofen Other (See Comments) 09/04/2014   Duloxetine Rash 08/28/2021    Family History  Problem Relation Age of Onset   Lung cancer Mother    Diabetes Sister    Heart disease Sister    Asthma Daughter    Arthritis Daughter    Arthritis Daughter    Thyroid cancer Other        1/2 sister   Heart disease Other        1/2 sister    Social History   Socioeconomic History   Marital status: Divorced    Spouse name: BASIL   Number of children: 3   Years of education: 1   Highest education level: GED or equivalent  Occupational History   Occupation: retired Corporate treasurer  Tobacco Use   Smoking status: Former    Packs/day: 0.50    Years: 40.00    Total pack years: 20.00    Types: Cigarettes    Quit date: 08/2020    Years since quitting: 1.6   Smokeless tobacco: Never  Vaping Use   Vaping Use: Never used  Substance and Sexual Activity   Alcohol use: Yes    Comment: occ   Drug use: No   Sexual activity: Not on file  Other Topics Concern   Not on file  Social History Narrative   Not on file   Social  Determinants of Health   Financial Resource Strain: Low Risk  (09/15/2018)   Overall Financial Resource Strain (CARDIA)    Difficulty of Paying Living Expenses: Not hard at all  Food Insecurity: No Food Insecurity (02/26/2022)   Hunger Vital Sign    Worried About Running Out of Food in the Last Year: Never true    Ran Out of Food in the Last Year: Never true  Transportation Needs: No Transportation Needs (02/26/2022)   PRAPARE - Hydrologist (Medical): No    Lack of Transportation (Non-Medical): No  Physical Activity: Inactive (09/15/2018)   Exercise Vital Sign    Days of Exercise per Week: 0 days    Minutes of Exercise per Session: 0 min  Stress: No Stress Concern Present (09/15/2018)   [place]    Feeling of Stress : Not at all  Social Connections: Moderately Isolated (09/15/2018)   Social Connection and Isolation Panel [NHANES]    Frequency of Communication with Friends and Family: More than three times a week    Frequency of Social Gatherings with Friends and Family: Three times a week    Attends Religious Services: More than 4 times per year    Active Member of Clubs or Organizations: No    Attends Archivist Meetings: Never    Marital Status: Divorced  Human resources officer Violence: Not At Risk (02/26/2022)   Humiliation, Afraid, Rape, and Kick questionnaire  Fear of Current or Ex-Partner: No    Emotionally Abused: No    Physically Abused: No    Sexually Abused: No    Review of Systems: Review of Systems  Constitutional:  Negative for chills, fever and weight loss.  HENT:  Negative for hearing loss and tinnitus.   Eyes:  Negative for blurred vision and double vision.  Respiratory:  Negative for cough and hemoptysis.   Cardiovascular:  Negative for chest pain and orthopnea.  Gastrointestinal:  Positive for blood in stool. Negative for abdominal pain, constipation, diarrhea,  heartburn, melena, nausea and vomiting.  Genitourinary:  Negative for dysuria and urgency.  Musculoskeletal:  Negative for myalgias and neck pain.  Skin:  Negative for itching and rash.  Neurological:  Negative for dizziness and headaches.  Psychiatric/Behavioral:  Negative for depression and suicidal ideas.      Physical Exam:Physical Exam Constitutional:      Appearance: Normal appearance.  HENT:     Head: Normocephalic and atraumatic.     Nose: Nose normal. No congestion.  Eyes:     General: No scleral icterus.    Extraocular Movements: Extraocular movements intact.  Cardiovascular:     Rate and Rhythm: Normal rate and regular rhythm.  Pulmonary:     Effort: Pulmonary effort is normal. No respiratory distress.  Abdominal:     General: Bowel sounds are normal. There is no distension.     Palpations: Abdomen is soft. There is no mass.     Tenderness: There is no abdominal tenderness. There is no guarding or rebound.     Hernia: No hernia is present.  Musculoskeletal:        General: No swelling. Normal range of motion.     Cervical back: Normal range of motion and neck supple.  Skin:    General: Skin is warm and dry.  Neurological:     General: No focal deficit present.     Mental Status: She is alert and oriented to person, place, and time.  Psychiatric:        Mood and Affect: Mood normal.        Behavior: Behavior normal.        Thought Content: Thought content normal.        Judgment: Judgment normal.     Vital signs: Vitals:   03/19/22 0930 03/19/22 1000  BP: (!) 172/82 (!) 209/164  Pulse: 70   Resp: 15   Temp:    SpO2: 100%    Last BM Date : 03/18/22    GI:  Lab Results: Recent Labs    03/18/22 1947 03/19/22 0105 03/19/22 0700  WBC 3.9* 4.1 3.8*  HGB 12.1 11.6* 12.5  HCT 38.0 37.1 38.8  PLT 162 160 178   BMET Recent Labs    03/18/22 0843 03/18/22 1947 03/19/22 0700  NA 130* 130* 131*  K 5.8* 5.5* 4.6  CL 98 95* 94*  CO2 22 22 25    GLUCOSE 94 74 104*  BUN 30* 31* 35*  CREATININE 5.02* 5.70* 6.18*  CALCIUM 8.7* 9.1 9.0   LFT Recent Labs    03/19/22 0700  PROT 5.7*  ALBUMIN 2.8*  AST 15  ALT 9  ALKPHOS 111  BILITOT 0.8   PT/INR No results for input(s): "LABPROT", "INR" in the last 72 hours.   Studies/Results: No results found.  Impression: BRBPR; suspect diverticular bleed -Hgb 11.6, stable -BUN 35, creatinine 6.18, GFR 7 - CT 02/25/2022 unrevealing  Plan: Patient with history of GI bleeds.  Has not had any bleeding since last night.  History of diverticulosis throughout colon and bright red bleeding with bowel movements.  Suspect possible diverticular bleed versus recurrent ulceration of her hemorrhoids.  Unsure if colonoscopy is needed at this time since hemoglobin is stable (11.6) and no further bleeding. If recurrence of bleeding would recommend CTA versus bleeding scan for evaluation of diverticular bleed. Continue daily CBC and transfuse as needed to maintain HGB > 7  [facility] will follow    LOS: 0 days   Dayane Hillenburg Radford Pax  PA-C 03/19/2022, 10:05 AM  Contact #  [phone]

## 2022-03-19 NOTE — Progress Notes (Signed)
PROGRESS NOTE    Jodi Bell  FOY:[id number] DOB: 06-Oct-1960 DOA: 03/18/2022 PCP: [name]., MD    Brief Narrative:   Jodi Bell is a 62 y.o. female with past medical history significant for ESRD on HD MWF, CAD, poorly controlled hypertension, DM2, COPD on 2 L nasal cannula at baseline, OSA who presented to [facility] ED on 2/6 with recurrent episodes of bright red blood per rectum over the last few days.  Reports on Sunday she had 1 episode, Monday 4 episodes and last night 3 episodes.  Initially reports sharp pain in her rectum that has resolved.  Denies abdominal pain, no nausea/vomiting, no generalized weakness, no chest pain, no shortness of breath.  In the ED, temperature 98.4 F, HR 76, RR 18, BP 246/93, SpO2 99% on 2 L nasal cannula which is her baseline.  WBC 3.6, hemoglobin 12.8, platelets 156.  Sodium 130, potassium 5.8, chloride 98, CO2 22, glucose 94, BUN 30, creatinine 5.02.  High sensitive troponin 24 followed by 21.  FOBT positive.  Patient was given hydralazine 100 mg p.o. x 1, Lokelma, and Maalox.  EDP consulted [facility] for admission and patient was transferred to [facility] under the hospitalist service for acute GI bleed, hyperkalemia.  Assessment & Plan:   GI bleed Patient presenting to ED with recurrent bright red blood per rectum since 2/4.  History of prior GI bleeding.  [facility] gastroenterology outpatient.  Hemoglobin 12.8 on arrival. -- Following, appreciate assistance -- Hgb 12.8>12.1>11.6>12.5 -- Continue monitor hemoglobin every 12 hours -- If recurrence of bleeding will obtain CTA bleeding scan versus nuc med bleeding scan for evaluation of diverticular bleed -- Transfuse for hemoglobin less than 7.0  Hyperkalemia Potassium 5.8 on admission.  Received Lokelma. -- Continue electrolyte management with HD  Hypertensive urgency CAD -- Carvedilol 25 mg p.o. twice daily -- Nifedipine XL 60 mg p.o. twice daily -- Irbesartan 3 mg p.o.  daily -- Hydralazine 100 g p.o. 3 times daily -- Clonidine 0.3 mg p.o. 3 times daily -- Imdur 60 minutes p.o. daily  COPD/chronic respiratory failure on 2 L nasal cannula On Stiolto Respimat 2 puffs daily at home. -- Continue with provide MS/Incruse Ellipta as hospital substitution -- Singulair 10 mg p.o. daily  Psoriatic arthritis -- Prednisone 5 mg p.o. daily  Type 2 diabetes mellitus Hemoglobin A1c 4.8, well-controlled.  Diet controlled at baseline.   DVT prophylaxis: SCDs Start: 03/18/22 1951    Code Status: Full Code Family Communication: No family present at bedside this morning  Disposition Plan:  Level of care: Progressive Status is: Observation The patient remains OBS appropriate and will d/c before 2 midnights.    Consultants:  [facility] gastroenterology Nephrology  Procedures:  None  Antimicrobials:  None   Subjective: Patient seen examined bedside, resting calmly.  Lying in bed currently in hemodialysis.  Denies any further bowel movement since admission.  Seen by GI this morning.  Hemoglobin appears stable.  No other specific complexions or concerns at this time.  Denies headache, no dizziness, no chest pain, no palpitations, no shortness of breath, no abdominal pain, no fever/chills/night sweats, no nausea/vomiting/diarrhea, no focal weakness, no fatigue, no paresthesias.  No acute events overnight per nurse staff.  Objective: Vitals:   03/19/22 1030 03/19/22 1100 03/19/22 1130 03/19/22 1153  BP: (!) 186/75 (!) 187/75 (!) 212/78 (!) 214/76  Pulse:      Resp:      Temp:    (!) 97.5 F (36.4 C)  TempSrc:    Oral  SpO2:      Weight:    59.4 kg  Height:        Intake/Output Summary (Last 24 hours) at 03/19/2022 1217 Last data filed at 03/19/2022 1153 Gross per 24 hour  Intake --  Output 1500 ml  Net -1500 ml   Filed Weights   03/18/22 0832 03/19/22 0829 03/19/22 1153  Weight: 59.5 kg 60.8 kg 59.4 kg    Examination:  Physical Exam: GEN: NAD,  alert and oriented x 3, chronically ill/elderly in appearance, appears older than stated age HEENT: NCAT, PERRL, EOMI, sclera clear, MMM PULM: CTAB w/o wheezes/crackles, normal respiratory effort, on 2 L nasal cannula which is her baseline CV: RRR w/o M/G/R GI: abd soft, NTND, NABS, no R/G/M MSK: no peripheral edema, muscle strength globally intact 5/5 bilateral upper/lower extremities, LUE AVF noted NEURO: CN II-XII intact, no focal deficits, sensation to light touch intact PSYCH: normal mood/affect Integumentary: dry/intact, no rashes or wounds    Data Reviewed: I have personally reviewed following labs and imaging studies  CBC: Recent Labs  Lab 03/18/22 0843 03/18/22 1947 03/19/22 0105 03/19/22 0700  WBC 3.6* 3.9* 4.1 3.8*  NEUTROABS 2.3  --   --   --   HGB 12.8 12.1 11.6* 12.5  HCT 41.1 38.0 37.1 38.8  MCV 100.5* 99.0 100.3* 98.7  PLT 156 162 160 053   Basic Metabolic Panel: Recent Labs  Lab 03/18/22 0843 03/18/22 1947 03/19/22 0700  NA 130* 130* 131*  K 5.8* 5.5* 4.6  CL 98 95* 94*  CO2 22 22 25   GLUCOSE 94 74 104*  BUN 30* 31* 35*  CREATININE 5.02* 5.70* 6.18*  CALCIUM 8.7* 9.1 9.0  MG  --  1.9  --   PHOS  --  5.2*  --    GFR: Estimated Creatinine Clearance: 7.9 mL/min (A) (by C-G formula based on SCr of 6.18 mg/dL (H)). Liver Function Tests: Recent Labs  Lab 03/18/22 1947 03/19/22 0700  AST 17 15  ALT 8 9  ALKPHOS 114 111  BILITOT 0.8 0.8  PROT 5.8* 5.7*  ALBUMIN 2.9* 2.8*   No results for input(s): "LIPASE", "AMYLASE" in the last 168 hours. No results for input(s): "AMMONIA" in the last 168 hours. Coagulation Profile: No results for input(s): "INR", "PROTIME" in the last 168 hours. Cardiac Enzymes: Recent Labs  Lab 03/18/22 1947  CKTOTAL 58   BNP (last 3 results) No results for input(s): "PROBNP" in the last 8760 hours. HbA1C: Recent Labs    03/18/22 1947  HGBA1C 4.8   CBG: Recent Labs  Lab 03/18/22 2043 03/19/22 0021  03/19/22 0358 03/19/22 0749  GLUCAP 134* 125* 92 99   Lipid Profile: No results for input(s): "CHOL", "HDL", "LDLCALC", "TRIG", "CHOLHDL", "LDLDIRECT" in the last 72 hours. Thyroid Function Tests: Recent Labs    03/18/22 1947  TSH 3.789   Anemia Panel: No results for input(s): "VITAMINB12", "FOLATE", "FERRITIN", "TIBC", "IRON", "RETICCTPCT" in the last 72 hours. Sepsis Labs: No results for input(s): "PROCALCITON", "LATICACIDVEN" in the last 168 hours.  No results found for this or any previous visit (from the past 240 hour(s)).       Radiology Studies: No results found.      Scheduled Meds:  allopurinol  100 mg Oral QPM   arformoterol  15 mcg Nebulization BID   And   umeclidinium bromide  1 puff Inhalation Daily   calcium acetate  1,334 mg Oral TID WC   carvedilol  25 mg Oral BID WC   Chlorhexidine Gluconate Cloth  6 each Topical Q0600   cinacalcet  60 mg Oral Q breakfast   cloNIDine  0.3 mg Oral TID   folic acid  0.5 mg Oral Daily   hydrALAZINE  100 mg Oral TID   insulin aspart  0-9 Units Subcutaneous Q4H   irbesartan  300 mg Oral QPC supper   isosorbide mononitrate  60 mg Oral Daily   montelukast  10 mg Oral Q supper   NIFEdipine  60 mg Oral BID   pantoprazole (PROTONIX) IV  40 mg Intravenous Q12H   predniSONE  5 mg Oral Q breakfast   sodium chloride flush  3 mL Intravenous Q12H   Continuous Infusions:  sodium chloride       LOS: 0 days    Time spent: 52 minutes spent on chart review, discussion with nursing staff, consultants, updating family and interview/physical exam; more than 50% of that time was spent in counseling and/or coordination of care.    Caelan Atchley J [place] DO [facility] Available via Epic secure chat 7am-7pm After these hours, please refer to coverage provider listed on [email or web address] 03/19/2022, 12:17 PM

## 2022-03-19 NOTE — Care Management Obs Status (Signed)
[facility] NOTIFICATION   Patient Details  Name: Jodi Bell MRN: [id number] Date of Birth: 03-23-1960   Medicare Observation Status Notification Given:  Yes    Levonne Lapping, RN 03/19/2022, 1:20 PM

## 2022-03-20 DIAGNOSIS — K922 Gastrointestinal hemorrhage, unspecified: Secondary | ICD-10-CM | POA: Diagnosis not present

## 2022-03-20 LAB — RENAL FUNCTION PANEL
Albumin: 2.8 g/dL — ABNORMAL LOW (ref 3.5–5.0)
Anion gap: 14 (ref 5–15)
BUN: 20 mg/dL (ref 8–23)
CO2: 24 mmol/L (ref 22–32)
Calcium: 8 mg/dL — ABNORMAL LOW (ref 8.9–10.3)
Chloride: 94 mmol/L — ABNORMAL LOW (ref 98–111)
Creatinine, Ser: 4.68 mg/dL — ABNORMAL HIGH (ref 0.44–1.00)
GFR, Estimated: 10 mL/min — ABNORMAL LOW (ref >60)
Glucose, Bld: 167 mg/dL — ABNORMAL HIGH (ref 70–99)
Phosphorus: 3.3 mg/dL (ref 2.5–4.6)
Potassium: 4.2 mmol/L (ref 3.5–5.1)
Sodium: 132 mmol/L — ABNORMAL LOW (ref 135–145)

## 2022-03-20 LAB — HEMOGLOBIN AND HEMATOCRIT, BLOOD
HCT: 38.3 % (ref 36.0–46.0)
Hemoglobin: 11.7 g/dL — ABNORMAL LOW (ref 12.0–15.0)

## 2022-03-20 MED ORDER — METOPROLOL TARTRATE 5 MG/5ML IV SOLN
10.0000 mg | Freq: Once | INTRAVENOUS | Status: AC
  Administered 2022-03-20: 10 mg via INTRAVENOUS
  Filled 2022-03-20: qty 10

## 2022-03-20 NOTE — TOC Transition Note (Signed)
Transition of Care [facility]) - CM/SW Discharge Note   Patient Details  Name: Jodi Bell MRN: [id number] Date of Birth: [birth year]  [facility] CM/SW Contact:  Levonne Lapping, RN Phone Number: 03/20/2022, 10:05 AM   Clinical Narrative:   .   Patient to dc home with [facility].  [facility] choice and all arranged. Information in AVS.  No DME needed No additional TOC needs identified         Patient Goals and CMS Choice      Discharge Placement                         Discharge Plan and Services Additional resources added to the After Visit Summary for                                       Social Determinants of Health (SDOH) Interventions SDOH Screenings   Food Insecurity: No Food Insecurity (02/26/2022)  Housing: Low Risk  (02/26/2022)  Transportation Needs: No Transportation Needs (02/26/2022)  Utilities: Not At Risk (02/26/2022)  Financial Resource Strain: Low Risk  (09/15/2018)  Physical Activity: Inactive (09/15/2018)  Social Connections: Moderately Isolated (09/15/2018)  Stress: No Stress Concern Present (09/15/2018)  Tobacco Use: Medium Risk (03/18/2022)     Readmission Risk Interventions    07/23/2021    3:40 PM 06/28/2021    5:10 PM  Readmission Risk Prevention Plan  Transportation Screening Complete Complete  Medication Review Press photographer) Complete Complete  PCP or Specialist appointment within 3-5 days of discharge Complete   HRI or [place] Complete Complete  SW Recovery Care/Counseling Consult Complete   Palliative Care Screening Not Applicable Not [place] Not Applicable Not Applicable

## 2022-03-20 NOTE — Progress Notes (Signed)
D/C order noted. Contacted [facility] and spoke to [place]. Clinic advised of pt's d/c today and that pt should resume care tomorrow. Clinic has access to [facility] epic to print needed clinicals.   Melven Sartorius Renal Navigator [phone]

## 2022-03-20 NOTE — Progress Notes (Signed)
Discharge instructions given. Patient verbalized understanding and all questions were answered.  Patient ride will be here soon per patient. Patient is not discharge lounge appropriate.

## 2022-03-20 NOTE — Discharge Summary (Signed)
Physician Discharge Summary  Jodi Bell RJJ:[id number] DOB: November 21, 1960 DOA: 03/18/2022  PCP: [name]., MD  Admit date: 03/18/2022 Discharge date: 03/20/2022  Admitted From: Home Disposition: Home  Recommendations for Outpatient Follow-up:  Follow up with PCP in 1-2 weeks Follow-up with [facility] gastroenterology as needed Please obtain CBC in one week to reassess hemoglobin level, was 11.7 at time of discharge  Home Health: No Equipment/Devices: On 2 L nasal cannula baseline, chronically  Discharge Condition: Stable CODE STATUS: Full code Diet recommendation: Renal diet with fluid restriction  History of present illness:  Jodi Bell is a 62 y.o. female with past medical history significant for ESRD on HD MWF, CAD, poorly controlled hypertension, DM2, COPD on 2 L nasal cannula at baseline, OSA who presented to [facility] ED on 2/6 with recurrent episodes of bright red blood per rectum over the last few days.  Reports on Sunday she had 1 episode, Monday 4 episodes and last night 3 episodes.  Initially reports sharp pain in her rectum that has resolved.  Denies abdominal pain, no nausea/vomiting, no generalized weakness, no chest pain, no shortness of breath.   In the ED, temperature 98.4 F, HR 76, RR 18, BP 246/93, SpO2 99% on 2 L nasal cannula which is her baseline.  WBC 3.6, hemoglobin 12.8, platelets 156.  Sodium 130, potassium 5.8, chloride 98, CO2 22, glucose 94, BUN 30, creatinine 5.02.  High sensitive troponin 24 followed by 21.  FOBT positive.  Patient was given hydralazine 100 mg p.o. x 1, Lokelma, and Maalox.  EDP consulted [facility] for admission and patient was transferred to [facility] under the hospitalist service for acute GI bleed, hyperkalemia.  Hospital course:  GI bleed likely secondary to diverticular versus hemorrhoidal Patient presenting to ED with recurrent bright red blood per rectum since 2/4.  History of prior GI bleeding.  [facility]  gastroenterology outpatient.  Hemoglobin 12.8 on arrival.  Gastroenterology was consulted and followed during hospital course.  GI with no recommendations with no plans endoscopic procedures.  No significant bleeding appreciated during her hospitalization and hemoglobin remained stable, 11.7 at time of discharge.  Repeat hemoglobin in 1 week.  Outpatient follow-up with gastroenterology as needed.   Hyperkalemia Potassium 5.8 on admission.  Received Lokelma.  Received HD while inpatient with resolution of elevated potassium.  Potassium 4.2 at time of discharge.  Continue electrolyte management with HD.   Hypertensive urgency CAD Continue home carvedilol 25 mg p.o. twice daily, Nifedipine XL 60 mg p.o. twice daily, Benicar 40 mg p.o. daily, Hydralazine 100 g p.o. 3 times daily, Clonidine 0.3 mg p.o. 3 times daily, Imdur 60 minutes p.o. daily   COPD/chronic respiratory failure on 2 L nasal cannula On Stiolto Respimat 2 puffs daily and montelukast at home.  Oxygen stable at 2 L nasal cannula.   Psoriatic arthritis Prednisone 5 mg p.o. daily   Type 2 diabetes mellitus Hemoglobin A1c 4.8, well-controlled.  Diet controlled at baseline.  Discharge Diagnoses:  Principal Problem:   Acute lower GI bleeding Active Problems:   Diabetes mellitus with coincident hypertension (HCC)   Essential hypertension   Psoriasis arthropathica (HCC)   GERD (gastroesophageal reflux disease)   COPD (chronic obstructive pulmonary disease) (HCC)   Anemia   ESRD (end stage renal disease) (HCC)   OSA (obstructive sleep apnea)   Chronic diastolic CHF (congestive heart failure) (HCC)   Coronary artery disease involving coronary bypass graft of native heart with angina pectoris (HCC)   Paroxysmal atrial  fibrillation ([place])   Chronic respiratory failure with hypoxia [facility])    Discharge Instructions  Discharge Instructions     Call MD for:  difficulty breathing, headache or visual disturbances   Complete by: As  directed    Call MD for:  extreme fatigue   Complete by: As directed    Call MD for:  persistant dizziness or light-headedness   Complete by: As directed    Call MD for:  persistant nausea and vomiting   Complete by: As directed    Call MD for:  severe uncontrolled pain   Complete by: As directed    Call MD for:  temperature >100.4   Complete by: As directed    Diet - low sodium heart healthy   Complete by: As directed    Increase activity slowly   Complete by: As directed       Allergies as of 03/20/2022       Reactions   3-methyl-2-benzothiazolinone Hydrazone Other (See Comments)   Muscle weakness muscle cramping in legs Other reaction(s): Myalgias (Muscle Pain)   Banana Anaphylaxis, Swelling, Other (See Comments)   TONGUE AND MOUTH SWELLING   Black Walnut Flavor Anaphylaxis, Itching   Walnuts   Hazelnut (filbert) Allergy Skin Test Anaphylaxis   Hazelnuts   Leflunomide And Related Other (See Comments)   Severe Headache   Lisinopril Swelling   Angioedema  Swelling of the face   No Healthtouch Food Allergies Anaphylaxis   Spicy Mustard 4.7.2021 Pt reports that she eats Regular Yellow Mustard Swelling and itching of tongue   Other Other (See Comments), Anaphylaxis, Swelling   Cosentyx= angioedema Other reaction(s): Facial Edema (intolerance) Mouth itches and tongue swelling Hazel nuts and pecans also Walnuts Walnuts Renal failure Other reaction(s): Other Serotonin Syndrome  Serotonin syndrome  Tremors Other reaction(s): Other Tremors Muscle weakness muscle cramping in legs Other reaction(s): Myalgias (Muscle Pain) Mouth itches and tongue swelling Hazel nuts and pecans also Walnuts   Pecan Extract Allergy Skin Test Anaphylaxis, Itching   Pecans   Pecan Nut (diagnostic) Anaphylaxis, Itching   Pecans   Trazodone And Nefazodone Other (See Comments)   Serotonin Syndrome  Tremors   Adalimumab Other (See Comments)   Blisters on abdomen =humira    Escitalopram Oxalate Other (See Comments)   Hand problems - Serotonin Syndrome    Ezetimibe Other (See Comments)   myalgias cramps   Secukinumab Swelling   Cosentyx = angiodema   Statins Other (See Comments)   Leg pains and weakness   Ferrlecit [na Ferric Gluc Cplx In Sucrose]    Not reaction given from HD   Gabapentin Other (See Comments)   tremors   Ibuprofen Other (See Comments)   Renal Problems   Duloxetine Rash        Medication List     TAKE these medications    acetaminophen 500 MG tablet Commonly known as: TYLENOL Take 1,000 mg by mouth daily as needed (pain).   albuterol (2.5 MG/3ML) 0.083% nebulizer solution Commonly known as: PROVENTIL Take 3 mLs (2.5 mg total) by nebulization every 6 (six) hours as needed for wheezing or shortness of breath.   Ventolin HFA 108 (90 Base) MCG/ACT inhaler Generic drug: albuterol Inhale 2 puffs into the lungs See admin instructions. Take 2 puffs every 4-6 hours as needed for wheezing and shortness of breath   allopurinol 100 MG tablet Commonly known as: ZYLOPRIM Take 100 mg by mouth every evening.   carboxymethylcellulose 1 % ophthalmic solution Place 2 drops into  both eyes 2 (two) times daily as needed (dry eyes).   carvedilol 25 MG tablet Commonly known as: COREG Take 25 mg by mouth 2 (two) times daily with a meal.   cetirizine 10 MG tablet Commonly known as: ZYRTEC Take 10 mg by mouth daily as needed for allergies.   cinacalcet 60 MG tablet Commonly known as: SENSIPAR Take 60 mg by mouth every evening.   cloNIDine 0.3 MG tablet Commonly known as: CATAPRES Take 0.3 mg by mouth 3 (three) times daily.   cyclobenzaprine 10 MG tablet Commonly known as: FLEXERIL Take 10 mg by mouth 2 (two) times daily as needed for muscle spasms.   Darbepoetin Alfa 200 MCG/0.4ML Sosy injection Commonly known as: ARANESP Inject 0.4 mLs (200 mcg total) into the vein every Wednesday with hemodialysis.   diclofenac Sodium 1 %  Gel Commonly known as: VOLTAREN Apply 2 g topically daily as needed (pain).   docusate sodium 100 MG capsule Commonly known as: COLACE Take 100 mg by mouth daily as needed for mild constipation.   ferrous sulfate 325 (65 FE) MG tablet Take 325 mg by mouth See admin instructions. Take 1 tablet by mouth three times a week on Tues, Thur, Sat only per patient   folic acid 540 MCG tablet Commonly known as: FOLVITE Take 400 mcg by mouth daily.   gabapentin 300 MG capsule Commonly known as: Neurontin Take 1 capsule (300 mg total) by mouth at bedtime as needed (Neuropathic pain). What changed: when to take this   heparin 1000 unit/mL Soln injection 1 mL (1,000 Units total) by Dialysis route as needed (in dialysis).   hydrALAZINE 100 MG tablet Commonly known as: APRESOLINE Take 1 tablet (100 mg total) by mouth every 8 (eight) hours. What changed: when to take this   isosorbide mononitrate 60 MG 24 hr tablet Commonly known as: IMDUR Take 1 tablet (60 mg total) by mouth daily.   LORazepam 1 MG tablet Commonly known as: ATIVAN Take 1 mg by mouth every 8 (eight) hours as needed for anxiety.   montelukast 10 MG tablet Commonly known as: SINGULAIR Take 10 mg by mouth daily with supper.   NIFEdipine 60 MG 24 hr tablet Commonly known as: PROCARDIA XL/NIFEDICAL XL Take 60 mg by mouth 2 (two) times daily.   olmesartan 40 MG tablet Commonly known as: BENICAR Take 40 mg by mouth every evening.   ondansetron 4 MG tablet Commonly known as: ZOFRAN Take 4 mg by mouth 2 (two) times daily as needed for nausea or vomiting.   OVER THE COUNTER MEDICATION Take by mouth daily as needed (sickness). Elderberry Fruit and flower 1 dropper full by mouth daily as needed for sickness   Oxycodone HCl 10 MG Tabs Take 10 mg by mouth daily as needed for pain.   OXYGEN Inhale 2 L into the lungs as directed.   pantoprazole 40 MG tablet Commonly known as: PROTONIX Take 40 mg by mouth daily.    predniSONE 5 MG tablet Commonly known as: DELTASONE Take 1 tablet (5 mg total) by mouth daily with breakfast.   PROBIOTIC PO Take 1 capsule by mouth daily.   Proctozone-HC 2.5 % rectal cream Generic drug: hydrocortisone Place rectally 2 (two) times daily. What changed:  how much to take when to take this reasons to take this   sorbitol 70 % Soln Take 30 ml 1-2 times daily as needed for constipation What changed:  when to take this reasons to take this   Stiolto Respimat 2.5-2.5  MCG/ACT Aers Generic drug: Tiotropium Bromide-Olodaterol Inhale 2 puffs once daily into the lungs.   cholecalciferol 10 MCG (400 UNIT) Tabs tablet Commonly known as: VITAMIN D3 Take 800 Units by mouth daily. What changed: Another medication with the same name was changed. Make sure you understand how and when to take each.   vitamin D3 25 MCG tablet Commonly known as: CHOLECALCIFEROL Take 1 tablet (1,000 Units total) by mouth every evening. What changed:  when to take this additional instructions   zolpidem 5 MG tablet Commonly known as: AMBIEN Take 5 mg by mouth at bedtime as needed for sleep.        Follow-up Information     [name]., MD. Schedule an appointment as soon as possible for a visit in 1 week(s).   Specialty: Internal Medicine Contact information: [address] [place] [address] [phone]         Gastroenterology, [place] [place]. Schedule an appointment as soon as possible for a visit.   Contact information: [address] [place] [address] [phone]                Allergies  Allergen Reactions   3-Methyl-2-Benzothiazolinone Hydrazone Other (See Comments)    Muscle weakness muscle cramping in legs Other reaction(s): Myalgias (Muscle Pain)   Banana Anaphylaxis, Swelling and Other (See Comments)    TONGUE AND MOUTH SWELLING   Black Walnut Flavor Anaphylaxis and Itching    Walnuts   Hazelnut ([place]) Allergy Skin  Test Anaphylaxis    Hazelnuts   Leflunomide And Related Other (See Comments)    Severe Headache   Lisinopril Swelling    Angioedema  Swelling of the face   No Healthtouch Food Allergies Anaphylaxis    Spicy Mustard 4.7.2021 Pt reports that she eats Regular Yellow Mustard Swelling and itching of tongue   Other Other (See Comments), Anaphylaxis and Swelling    Cosentyx= angioedema  Other reaction(s): Facial Edema (intolerance) Mouth itches and tongue swelling Hazel nuts and pecans also Walnuts Walnuts Renal failure Other reaction(s): Other Serotonin Syndrome  Serotonin syndrome  Tremors Other reaction(s): Other Tremors Muscle weakness muscle cramping in legs Other reaction(s): Myalgias (Muscle Pain) Mouth itches and tongue swelling Hazel nuts and pecans also Walnuts    Pecan Extract Allergy Skin Test Anaphylaxis and Itching    Pecans    Pecan Nut (Diagnostic) Anaphylaxis and Itching    Pecans   Trazodone And Nefazodone Other (See Comments)    Serotonin Syndrome  Tremors   Adalimumab Other (See Comments)    Blisters on abdomen =humira   Escitalopram Oxalate Other (See Comments)    Hand problems - Serotonin Syndrome     Ezetimibe Other (See Comments)    myalgias cramps    Secukinumab Swelling    Cosentyx = angiodema    Statins Other (See Comments)    Leg pains and weakness   Ferrlecit [Na Ferric Gluc Cplx In Sucrose]     Not reaction given from HD   Gabapentin Other (See Comments)    tremors   Ibuprofen Other (See Comments)    Renal Problems   Duloxetine Rash    Consultations: [facility], Dr. Michail Sermon Nephrology   Procedures/Studies: CT ABDOMEN PELVIS W CONTRAST  Result Date: 02/25/2022 CLINICAL DATA:  Left lower quadrant abdominal pain. EXAM: CT ABDOMEN AND PELVIS WITH CONTRAST TECHNIQUE: Multidetector CT imaging of the abdomen and pelvis was performed using the standard protocol following bolus administration of intravenous contrast.  RADIATION  DOSE REDUCTION: This exam was performed according to the departmental dose-optimization program which includes automated exposure control, adjustment of the mA and/or kV according to patient size and/or use of iterative reconstruction technique. CONTRAST:  79mL OMNIPAQUE IOHEXOL 350 MG/ML SOLN COMPARISON:  CT scan 09/09/2020 FINDINGS: Lower chest: Very small bilateral pleural effusions with overlying bibasilar atelectasis. Stable cardiac enlargement but no pericardial effusion. Stable aortic and coronary artery calcifications, advanced for age. Hepatobiliary: No hepatic lesions or intrahepatic biliary dilatation. The gallbladder demonstrates moderate wall thickening possibly related to the underlying abdominal fluid and possible low albumin. This was also present on the prior CT scan and is unlikely acute cholecystitis. No common bile duct dilatation. Pancreas: No mass, inflammation or ductal dilatation. Spleen: Normal size.  No worrisome lesions. Adrenals/Urinary Tract: Adrenal glands are. Numerous bilateral renal cysts are stable and do not need any further imaging evaluation or follow-up. The bladder is grossly normal. It is largely decompressed. Stomach/Bowel: The stomach contains 2 large vascular clips likely related to prior GI bleed. No acute inflammatory process. The duodenum, small bowel and colon are grossly normal. No obstructive findings. Diffuse colonic diverticulosis without findings for acute diverticulitis. Vascular/Lymphatic: Advanced atherosclerotic calcifications involving the aorta and branch vessels. The major venous structures are patent. No mesenteric or retroperitoneal mass or adenopathy. Reproductive: The uterus and ovaries are grossly normal. Other: Small volume abdominal ascites along with mesenteric edema and diffuse body wall edema. Small to moderate amount of free pelvic fluid. Musculoskeletal: Chronic severe compression fracture of L3 with fusion hardware at L3-4 and severe  degenerative changes and possibly post infectious changes at L2-3. Stable bone lesions at T12 and L4. IMPRESSION: 1. Small volume abdominal ascites along with mesenteric edema and diffuse body wall edema. 2. Diffuse colonic diverticulosis without findings for acute diverticulitis. 3. Very small bilateral pleural effusions with overlying bibasilar atelectasis. 4. Stable cardiac enlargement. 5. Advanced atherosclerotic calcifications involving the aorta and branch vessels. 6. Stable large vascular clips in the stomach likely related to prior GI bleed. 7. Severe chronic changes involving the lumbar spine. Aortic Atherosclerosis (ICD10-I70.0). Electronically Signed   By: Marijo Sanes M.D.   On: 02/25/2022 16:47     Subjective: Patient seen examined at bedside, resting comfortably.  Lying in bed.  Hemoglobin stable.  GI plans no endoscopic procedures.  Denies any further blood in the stool this morning.  Ready for discharge home.  Denies headache, no dizziness, no chest pain, no palpitations, no shortness of breath, no abdominal pain, no fever/chills/night sweats, no nausea/vomiting/diarrhea.  No acute events overnight per nursing staff.  Heart rate recorded as 195 this morning, inaccurate as heart rate during evaluation at bedside at time of discharge was 65.  Discharge Exam: Vitals:   03/20/22 0600 03/20/22 0839  BP: (!) 187/77 (!) 174/70  Pulse:  (!) 195  Resp: 19 16  Temp:  98.2 F (36.8 C)  SpO2:  100%   Vitals:   03/20/22 0411 03/20/22 0500 03/20/22 0600 03/20/22 0839  BP: (!) 222/86 (!) 200/89 (!) 187/77 (!) 174/70  Pulse:    (!) 195  Resp:  19 19 16   Temp:    98.2 F (36.8 C)  TempSrc:    Oral  SpO2:    100%  Weight:      Height:        Physical Exam: GEN: NAD, alert and oriented x 3, chronically ill/elderly in appearance, appears older than stated age HEENT: NCAT, PERRL, EOMI, sclera clear, MMM PULM: CTAB w/o wheezes/crackles,  normal respiratory effort, on 2 L nasal, which is  her baseline CV: RRR w/o M/G/R GI: abd soft, NTND, NABS, no R/G/M MSK: no peripheral edema, muscle strength globally intact 5/5 bilateral upper/lower extremities, LUE AVF noted NEURO: CN II-XII intact, no focal deficits, sensation to light touch intact PSYCH: normal mood/affect Integumentary: dry/intact, no rashes or wounds    The results of significant diagnostics from this hospitalization (including imaging, microbiology, ancillary and laboratory) are listed below for reference.     Microbiology: No results found for this or any previous visit (from the past 240 hour(s)).   Labs: BNP (last 3 results) Recent Labs    10/20/21 1609  BNP >4,332.9*   Basic Metabolic Panel: Recent Labs  Lab 03/18/22 0843 03/18/22 1947 03/19/22 0700 03/20/22 0440  NA 130* 130* 131* 132*  K 5.8* 5.5* 4.6 4.2  CL 98 95* 94* 94*  CO2 22 22 25 24   GLUCOSE 94 74 104* 167*  BUN 30* 31* 35* 20  CREATININE 5.02* 5.70* 6.18* 4.68*  CALCIUM 8.7* 9.1 9.0 8.0*  MG  --  1.9  --   --   PHOS  --  5.2*  --  3.3   Liver Function Tests: Recent Labs  Lab 03/18/22 1947 03/19/22 0700 03/20/22 0440  AST 17 15  --   ALT 8 9  --   ALKPHOS 114 111  --   BILITOT 0.8 0.8  --   PROT 5.8* 5.7*  --   ALBUMIN 2.9* 2.8* 2.8*   No results for input(s): "LIPASE", "AMYLASE" in the last 168 hours. No results for input(s): "AMMONIA" in the last 168 hours. CBC: Recent Labs  Lab 03/18/22 0843 03/18/22 1947 03/19/22 0105 03/19/22 0700 03/19/22 1644 03/20/22 0440  WBC 3.6* 3.9* 4.1 3.8* 4.2  --   NEUTROABS 2.3  --   --   --   --   --   HGB 12.8 12.1 11.6* 12.5 12.2 11.7*  HCT 41.1 38.0 37.1 38.8 38.6 38.3  MCV 100.5* 99.0 100.3* 98.7 98.2  --   PLT 156 162 160 178 179  --    Cardiac Enzymes: Recent Labs  Lab 03/18/22 1947  CKTOTAL 58   BNP: Invalid input(s): "POCBNP" CBG: Recent Labs  Lab 03/18/22 2043 03/19/22 0021 03/19/22 0358 03/19/22 0749 03/19/22 1253  GLUCAP 134* 125* 92 99 87    D-Dimer No results for input(s): "DDIMER" in the last 72 hours. Hgb A1c Recent Labs    03/18/22 1947  HGBA1C 4.8   Lipid Profile No results for input(s): "CHOL", "HDL", "LDLCALC", "TRIG", "CHOLHDL", "LDLDIRECT" in the last 72 hours. Thyroid function studies Recent Labs    03/18/22 1947  TSH 3.789   Anemia work up No results for input(s): "VITAMINB12", "FOLATE", "FERRITIN", "TIBC", "IRON", "RETICCTPCT" in the last 72 hours. Urinalysis    Component Value Date/Time   COLORURINE YELLOW 09/12/2020 2333   APPEARANCEUR HAZY (A) 09/12/2020 2333   LABSPEC 1.012 09/12/2020 2333   PHURINE 8.0 09/12/2020 2333   GLUCOSEU NEGATIVE 09/12/2020 2333   HGBUR SMALL (A) 09/12/2020 2333   BILIRUBINUR NEGATIVE 09/12/2020 2333   KETONESUR NEGATIVE 09/12/2020 2333   PROTEINUR 100 (A) 09/12/2020 2333   UROBILINOGEN 0.2 09/05/2014 0013   NITRITE NEGATIVE 09/12/2020 2333   LEUKOCYTESUR NEGATIVE 09/12/2020 2333   Sepsis Labs Recent Labs  Lab 03/18/22 1947 03/19/22 0105 03/19/22 0700 03/19/22 1644  WBC 3.9* 4.1 3.8* 4.2   Microbiology No results found for this or any previous visit (from the  past 240 hour(s)).   Time coordinating discharge: Over 30 minutes  SIGNED:   Tallia Moehring J [place] DO  [facility] 03/20/2022, 9:53 AM

## 2022-03-20 NOTE — Progress Notes (Signed)
[facility] Progress Note   Subjective:  Seen in room - feels ok. Occ stomach pains, but no bleeding this AM. For discharge today per patient.  Objective Vitals:   03/20/22 0411 03/20/22 0500 03/20/22 0600 03/20/22 0839  BP: (!) 222/86 (!) 200/89 (!) 187/77 (!) 174/70  Pulse:    (!) 195  Resp:  19 19 16   Temp:    98.2 F (36.8 C)  TempSrc:    Oral  SpO2:    100%  Weight:      Height:       Physical Exam General: Well appearing woman, NAD Heart: RRR; no murmur Lungs: CTA anteriorly Abdomen: soft Extremities: no LE edema Dialysis Access: L AVF  Additional Objective Labs: Basic Metabolic Panel: Recent Labs  Lab 03/18/22 1947 03/19/22 0700 03/20/22 0440  NA 130* 131* 132*  K 5.5* 4.6 4.2  CL 95* 94* 94*  CO2 22 25 24   GLUCOSE 74 104* 167*  BUN 31* 35* 20  CREATININE 5.70* 6.18* 4.68*  CALCIUM 9.1 9.0 8.0*  PHOS 5.2*  --  3.3   Liver Function Tests: Recent Labs  Lab 03/18/22 1947 03/19/22 0700 03/20/22 0440  AST 17 15  --   ALT 8 9  --   ALKPHOS 114 111  --   BILITOT 0.8 0.8  --   PROT 5.8* 5.7*  --   ALBUMIN 2.9* 2.8* 2.8*   CBC: Recent Labs  Lab 03/18/22 0843 03/18/22 1947 03/19/22 0105 03/19/22 0700 03/19/22 1644 03/20/22 0440  WBC 3.6* 3.9* 4.1 3.8* 4.2  --   NEUTROABS 2.3  --   --   --   --   --   HGB 12.8 12.1 11.6* 12.5 12.2 11.7*  HCT 41.1 38.0 37.1 38.8 38.6 38.3  MCV 100.5* 99.0 100.3* 98.7 98.2  --   PLT 156 162 160 178 179  --    Blood Culture    Component Value Date/Time   SDES BLOOD SITE NOT SPECIFIED 06/26/2021 0650   SPECREQUEST  06/26/2021 0650    BOTTLES DRAWN AEROBIC AND ANAEROBIC Blood Culture results may not be optimal due to an excessive volume of blood received in culture bottles   CULT  06/26/2021 0650    NO GROWTH 5 DAYS Performed at [facility], [place] [address], [place], [place] [address]    REPTSTATUS 07/01/2021 FINAL 06/26/2021 0650    Cardiac Enzymes: Recent Labs  Lab 03/18/22 1947   CKTOTAL 58   CBG: Recent Labs  Lab 03/18/22 2043 03/19/22 0021 03/19/22 0358 03/19/22 0749 03/19/22 1253  GLUCAP 134* 125* 92 99 87   Medications:  sodium chloride      allopurinol  100 mg Oral QPM   arformoterol  15 mcg Nebulization BID   And   umeclidinium bromide  1 puff Inhalation Daily   carvedilol  25 mg Oral BID WC   Chlorhexidine Gluconate Cloth  6 each Topical Q0600   cinacalcet  60 mg Oral Q breakfast   cloNIDine  0.3 mg Oral TID   folic acid  0.5 mg Oral Daily   hydrALAZINE  100 mg Oral TID   irbesartan  300 mg Oral QPC supper   isosorbide mononitrate  60 mg Oral Daily   montelukast  10 mg Oral Q supper   NIFEdipine  60 mg Oral BID   pantoprazole (PROTONIX) IV  40 mg Intravenous Q12H   predniSONE  5 mg Oral Q breakfast   sodium chloride flush  3  mL Intravenous Q12H    Dialysis Orders: [facility] MWF  Needs updating -- > 3.5h  400/600  L AVF  Hep none EDW 60kg    Assessment/ Plan: Acute GIB - w/ bloody stools, felt diverticular in nature. No GI procedures. ESRD - on HD MWF, s/p HD yesterday - went fine. No changes to outpatient Rx. HTN/ volume - vol okay on exam, < 1kg up today. UF goal the same.  Severe HTN: BP remains high - continue home meds for now and UF as tolerated with HD. Anemia esrd - Hb 11.7, no esa needs.  MBD ckd - CCa in range, cont home meds. COPD - on chronic O2 2L at home  CAD sp CABG    Veneta Penton, PA-C 03/20/2022, 10:06 AM  [facility]

## 2022-03-20 NOTE — Care Management (Signed)
CM acknowledges Consult to [facility] for [facility] and DME needs.  Chart has been reviewed and TOC will continue to follow patient for any additional discharge needs

## 2022-03-21 ENCOUNTER — Other Ambulatory Visit: Payer: Medicare PPO

## 2022-04-08 ENCOUNTER — Telehealth: Payer: Self-pay | Admitting: Pulmonary Disease

## 2022-04-08 MED ORDER — VENTOLIN HFA 108 (90 BASE) MCG/ACT IN AERS: 2.0000 | INHALATION_SPRAY | RESPIRATORY_TRACT | 6 refills | Status: DC

## 2022-04-08 NOTE — Telephone Encounter (Signed)
Patient is calling to request a refill on her albuterol inhaler w/ventolin.  She stated that the pharmacy told her should would need a prescription stating that she needed the ventolin specifically.  Please advise and call patient to confirm.  CB# [phone]

## 2022-04-08 NOTE — Telephone Encounter (Signed)
Called patient and she told me she needed refills. Verified medication and pharmacy. Nothing further needed

## 2022-04-10 ENCOUNTER — Ambulatory Visit: Payer: Medicare PPO | Admitting: Cardiology

## 2022-04-10 ENCOUNTER — Encounter: Payer: Self-pay | Admitting: Cardiology

## 2022-05-20 ENCOUNTER — Ambulatory Visit: Payer: Medicare PPO | Admitting: Pulmonary Disease

## 2022-05-20 ENCOUNTER — Encounter: Payer: Self-pay | Admitting: Pulmonary Disease

## 2022-05-20 VITALS — HR 71 | Wt 137.6 lb

## 2022-05-20 DIAGNOSIS — R0609 Other forms of dyspnea: Secondary | ICD-10-CM

## 2022-05-20 DIAGNOSIS — U071 COVID-19: Secondary | ICD-10-CM | POA: Diagnosis not present

## 2022-05-20 NOTE — Progress Notes (Signed)
@Patient  ID: Jodi Bell, female    DOB: 1960/02/21, 62 y.o.   MRN: [id number]  Chief Complaint  Patient presents with   Hospitalization Follow-up    62 y.o. Pt is here for hospital follow up from Feb. Pt states the doc told her something about COPD. Pt states that Stiolto is not helping much. Pulm rehab is needing another script.     Referring provider: Laqueta Due., MD  HPI:   62 y.o. woman whom we are seeing in hospital follow-up and for follow up for evaluation of dyspnea on exertion, chronic hypoxemic respiratory failure with imaging consistently showing signs of volume overload, pulmonary edema often with hypertensive urgency, diastolic dysfunction, ESRD status.  Discharge summary 04/2022 reviewed.  Hospitalized 04/2022.  Hypertensive.  Pulmonary edema on chest x-ray.  CT scan 2 weeks prior demonstrated pulmonary edema interlobular septal thickening etc.  Her blood pressure improved.  Fluid was removed.  She was treated with antibiotics and steroids but this seems appropriate.  Overall improved.  Now back home.  Discharged about 20 days ago.  She would like to move forward with cataract surgery in the future.  HPI at initial visit: She has chronic dyspnea.  Using oxygen for couple months now.  Checks her oxygen saturation usually stays okay while at rest, occasionally drops while at rest.  Usually drops when moving.  Some days are better than others.  Despite using 2 L occasionally drops into the 80s with exertion per her report.  She was walk today 3 laps around the office and did not desaturate below 95%.  Reviewed at length her prior spirometry and chest imaging.  Spirometry does not show COPD.  Chest imaging repeatedly shows evidence of volume overload and pulmonary edema.  Discussed this is most likely the cause for her hypoxemia.  Suspect it waxes and wanes based on her volume status as it relates to the timing of her dialysis.  She was counseled the same.  Reviewed most recent CT chest  12-2020 that shows evidence of volume overload with interlobular septal thickening and small bilateral pleural effusions without evidence of ILD or other parenchymal abnormality.  Most recent chest x-ray 02/2021 reviewed which shows peribronchovascular opacities most consistent with pulmonary edema on my interpretation.  Reviewed TTE most recent 12/2020 that shows severe left atrial dilation, grade 2 diastolic dysfunction, elevated left atrial pressure, aortic stenosis, mitral valve regurgitation and mitral valve stenosis as well as enlarged RV with reduced function.  Counseled most likely reason for her dyspnea is volume overloaded as a relates to her chronic heart failure worsened as she cannot excrete her own urine with relied on dialysis for volume removal.  PMH: ESRD on HD, chronic diastolic heart failure, mitral and aortic valve abnormalities, hypertension Surgical history: Knee replacement, CABG Family history: Mother with history of lung cancer Social history: Former smoker, 20-pack-year history, quit 2022, lives in [place] / Pulmonary Flowsheets:   ACT:      No data to display           MMRC:     No data to display           Epworth:      No data to display           Tests:   FENO:  No results found for: "NITRICOXIDE"  PFT:    Latest Ref Rng & Units 07/10/2021    9:35 AM 09/11/2020    8:33 AM  PFT Results  FVC-Pre  L 1.10  1.28   FVC-Predicted Pre % 47  52   FVC-Post L 1.25    FVC-Predicted Post % 54    Pre FEV1/FVC % % 83  87   Post FEV1/FCV % % 83    FEV1-Pre L 0.91  1.11   FEV1-Predicted Pre % 50  57   FEV1-Post L 1.03    DLCO uncorrected ml/min/mmHg 8.68    DLCO UNC% % 47    DLCO corrected ml/min/mmHg 10.74    DLCO COR %Predicted % 59    DLVA Predicted % 89    TLC L 2.75    TLC % Predicted % 59    RV % Predicted % 77     07/10/21 personally reviewed interpreted as: Spirometry indicative of severe restriction versus air trapping  without significant bronchodilator response.  Lung volumes confirm moderate restriction.  DLCO is moderately reduced when corrected for hemoglobin.  Prior spirometry 09/2020 reviewed interpreted as no fixed obstruction, suggestive of moderate restriction versus gas trapping.  WALK:      No data to display           Imaging: Personally reviewed and as per EMR discussion this note No results found.  Lab Results: Personally reviewed CBC    Component Value Date/Time   WBC 4.2 03/19/2022 1644   RBC 3.93 03/19/2022 1644   HGB 11.7 (L) 03/20/2022 0440   HCT 38.3 03/20/2022 0440   PLT 179 03/19/2022 1644   MCV 98.2 03/19/2022 1644   MCV 94.5 10/12/2012 1211   MCH 31.0 03/19/2022 1644   MCHC 31.6 03/19/2022 1644   RDW 18.9 (H) 03/19/2022 1644   LYMPHSABS 0.6 (L) 03/18/2022 0843   MONOABS 0.3 03/18/2022 0843   EOSABS 0.4 03/18/2022 0843   BASOSABS 0.0 03/18/2022 0843    BMET    Component Value Date/Time   NA 132 (L) 03/20/2022 0440   K 4.2 03/20/2022 0440   CL 94 (L) 03/20/2022 0440   CO2 24 03/20/2022 0440   GLUCOSE 167 (H) 03/20/2022 0440   BUN 20 03/20/2022 0440   CREATININE 4.68 (H) 03/20/2022 0440   CREATININE 1.34 (H) 09/18/2011 1400   CALCIUM 8.0 (L) 03/20/2022 0440   GFRNONAA 10 (L) 03/20/2022 0440   GFRAA 16 (L) 11/09/2019 1152    BNP    Component Value Date/Time   BNP >4,500.0 (H) 10/20/2021 1609    ProBNP No results found for: "PROBNP"  Specialty Problems       Pulmonary Problems   COPD (chronic obstructive pulmonary disease)   Asthma   OSA (obstructive sleep apnea)   Acute on chronic respiratory failure with hypoxia   Chronic respiratory failure with hypoxia    Allergies  Allergen Reactions   3-Methyl-2-Benzothiazolinone Hydrazone Other (See Comments)    Muscle weakness muscle cramping in legs Other reaction(s): Myalgias (Muscle Pain)   Banana Anaphylaxis, Swelling and Other (See Comments)    TONGUE AND MOUTH SWELLING   Black Walnut  Flavor Anaphylaxis and Itching    Walnuts   Hazelnut (Filbert) Anaphylaxis    Hazelnuts   Leflunomide And Related Other (See Comments)    Severe Headache   Lisinopril Swelling    Angioedema  Swelling of the face   No [employer] Food Allergies Anaphylaxis    Spicy Mustard 4.7.2021 Pt reports that she eats Regular Yellow Mustard Swelling and itching of tongue   Other Other (See Comments), Anaphylaxis and Swelling    Cosentyx= angioedema  Other reaction(s): Facial Edema (intolerance) Mouth itches  and tongue swelling Hazel nuts and pecans also Walnuts Walnuts Renal failure Other reaction(s): Other Serotonin Syndrome  Serotonin syndrome  Tremors Other reaction(s): Other Tremors Muscle weakness muscle cramping in legs Other reaction(s): Myalgias (Muscle Pain) Mouth itches and tongue swelling Hazel nuts and pecans also Walnuts    Pecan Extract Anaphylaxis and Itching    Pecans    Pecan Nut (Diagnostic) Anaphylaxis and Itching    Pecans   Trazodone And Nefazodone Other (See Comments)    Serotonin Syndrome  Tremors   Adalimumab Other (See Comments)    Blisters on abdomen =humira   Escitalopram Oxalate Other (See Comments)    Hand problems - Serotonin Syndrome     Ezetimibe Other (See Comments)    myalgias cramps    Secukinumab Swelling    Cosentyx = angiodema    Statins Other (See Comments)    Leg pains and weakness   Ferrlecit [Na Ferric Gluc Cplx In Sucrose]     Not reaction given from HD   Gabapentin Other (See Comments)    tremors   Ibuprofen Other (See Comments)    Renal Problems   Duloxetine Rash    Immunization History  Administered Date(s) Administered   Influenza Split 12/25/2008, 11/12/2010, 11/06/2012, 11/10/2013, 01/02/2014, 11/11/2015, 11/13/2017, 10/29/2018   Influenza, Seasonal, Injecte, Preservative Fre 12/25/2008, 11/12/2010, 11/06/2012   Influenza,inj,Quad PF,6+ Mos 01/02/2014, 11/13/2017, 10/29/2018   Influenza,inj,Quad PF,6-35 Mos  11/24/2011, 10/27/2012, 11/03/2012, 10/29/2018   Influenza,inj,quad, With Preservative 11/01/2019   Influenza-Unspecified 12/25/2008, 11/12/2010, 11/06/2012, 11/10/2013, 01/02/2014, 11/11/2015, 11/13/2017, 10/29/2018   Moderna Sars-Covid-2 Vaccination 04/30/2019, 06/01/2019   Pneumococcal Conjugate-13 03/17/2010, 07/01/2019   Pneumococcal Polysaccharide-23 02/10/2005, 12/24/2008, 12/11/2012, 04/21/2013, 03/09/2020   Tdap 01/13/2018    Past Medical History:  Diagnosis Date   Anemia of chronic disease    Anxiety    Arthritis    oa and psoriatic ra   Asthma    Back pain, chronic    lower back   Chronic insomnia    COPD (chronic obstructive pulmonary disease)    Diabetes mellitus    type 2   ESRD on dialysis    Essential hypertension    GERD (gastroesophageal reflux disease)    Gout    History of hiatal hernia    Hypoalbuminemia    Hyponatremia    Mild aortic stenosis    Mitral regurgitation    Mitral stenosis    Obesity    PAD (peripheral artery disease)    a. externial iliac calcification seen on CT 04/2020.   Sleep apnea    Tobacco abuse    Upper GI bleed 05/2019    Tobacco History: Social History   Tobacco Use  Smoking Status Former   Packs/day: 0.50   Years: 40.00   Additional pack years: 0.00   Total pack years: 20.00   Types: Cigarettes   Quit date: 08/2020   Years since quitting: 1.7  Smokeless Tobacco Never   Counseling given: Not Answered   Continue to not smoke  Outpatient Encounter Medications as of 05/20/2022  Medication Sig   acetaminophen (TYLENOL) 500 MG tablet Take 1,000 mg by mouth daily as needed (pain).   albuterol (PROVENTIL) (2.5 MG/3ML) 0.083% nebulizer solution Take 3 mLs (2.5 mg total) by nebulization every 6 (six) hours as needed for wheezing or shortness of breath.   allopurinol (ZYLOPRIM) 100 MG tablet Take 100 mg by mouth every evening.   carboxymethylcellulose 1 % ophthalmic solution Place 2 drops into both eyes 2 (two) times  daily as needed (  dry eyes).   carvedilol (COREG) 25 MG tablet Take 25 mg by mouth 2 (two) times daily with a meal.   cetirizine (ZYRTEC) 10 MG tablet Take 10 mg by mouth daily as needed for allergies.   cholecalciferol (VITAMIN D) 25 MCG tablet Take 1 tablet (1,000 Units total) by mouth every evening. (Patient taking differently: Take 1,000 Units by mouth See admin instructions. Take one tablet by mouth on Monday Wednesdays and Fridays)   cholecalciferol (VITAMIN D3) 10 MCG (400 UNIT) TABS tablet Take 800 Units by mouth daily.   cinacalcet (SENSIPAR) 60 MG tablet Take 60 mg by mouth every evening.   cloNIDine (CATAPRES) 0.3 MG tablet Take 0.3 mg by mouth 3 (three) times daily.   cyclobenzaprine (FLEXERIL) 10 MG tablet Take 10 mg by mouth 2 (two) times daily as needed for muscle spasms.   Darbepoetin Alfa (ARANESP) 200 MCG/0.4ML SOSY injection Inject 0.4 mLs (200 mcg total) into the vein every Wednesday with hemodialysis.   diclofenac Sodium (VOLTAREN) 1 % GEL Apply 2 g topically daily as needed (pain).   docusate sodium (COLACE) 100 MG capsule Take 100 mg by mouth daily as needed for mild constipation.   ferrous sulfate 325 (65 FE) MG tablet Take 325 mg by mouth See admin instructions. Take 1 tablet by mouth three times a week on Tues, Thur, Sat only per patient   folic acid (FOLVITE) 400 MCG tablet Take 400 mcg by mouth daily.   heparin 1000 unit/mL SOLN injection 1 mL (1,000 Units total) by Dialysis route as needed (in dialysis).   hydrALAZINE (APRESOLINE) 100 MG tablet Take 1 tablet (100 mg total) by mouth every 8 (eight) hours. (Patient taking differently: Take 100 mg by mouth 3 (three) times daily.)   hydrocortisone (ANUSOL-HC) 2.5 % rectal cream Place rectally 2 (two) times daily. (Patient taking differently: Place 1 Application rectally daily as needed for hemorrhoids or anal itching.)   isosorbide mononitrate (IMDUR) 60 MG 24 hr tablet Take 1 tablet (60 mg total) by mouth daily.   LORazepam  (ATIVAN) 1 MG tablet Take 1 mg by mouth every 8 (eight) hours as needed for anxiety.   montelukast (SINGULAIR) 10 MG tablet Take 10 mg by mouth daily with supper.    NIFEdipine (PROCARDIA XL/NIFEDICAL XL) 60 MG 24 hr tablet Take 60 mg by mouth 2 (two) times daily.   olmesartan (BENICAR) 40 MG tablet Take 40 mg by mouth every evening.   ondansetron (ZOFRAN) 4 MG tablet Take 4 mg by mouth 2 (two) times daily as needed for nausea or vomiting.   OVER THE COUNTER MEDICATION Take by mouth daily as needed (sickness). Elderberry Fruit and flower 1 dropper full by mouth daily as needed for sickness   Oxycodone HCl 10 MG TABS Take 10 mg by mouth daily as needed for pain.   OXYGEN Inhale 2 L into the lungs as directed.   pantoprazole (PROTONIX) 40 MG tablet Take 40 mg by mouth daily.   predniSONE (DELTASONE) 5 MG tablet Take 1 tablet (5 mg total) by mouth daily with breakfast.   Probiotic Product (PROBIOTIC PO) Take 1 capsule by mouth daily.   sorbitol 70 % SOLN Take 30 ml 1-2 times daily as needed for constipation (Patient taking differently: Take 30 mLs by mouth daily as needed for mild constipation.)   Tiotropium Bromide-Olodaterol (STIOLTO RESPIMAT) 2.5-2.5 MCG/ACT AERS Inhale 2 puffs once daily into the lungs.   VENTOLIN HFA 108 (90 Base) MCG/ACT inhaler Inhale 2 puffs into the lungs  See admin instructions. Take 2 puffs every 4-6 hours as needed for wheezing and shortness of breath   zolpidem (AMBIEN) 5 MG tablet Take 5 mg by mouth at bedtime as needed for sleep.   gabapentin (NEURONTIN) 300 MG capsule Take 1 capsule (300 mg total) by mouth at bedtime as needed (Neuropathic pain). (Patient taking differently: Take 300 mg by mouth daily as needed (Neuropathic pain).)   No facility-administered encounter medications on file as of 05/20/2022.     Review of Systems  Review of Systems  N/a Physical Exam  Pulse 71   Wt 137 lb 9.6 oz (62.4 kg)   SpO2 97%   BMI 24.37 kg/m   Wt Readings from Last 5  Encounters:  05/20/22 137 lb 9.6 oz (62.4 kg)  03/19/22 130 lb 15.3 oz (59.4 kg)  02/26/22 138 lb 14.2 oz (63 kg)  02/05/22 139 lb (63 kg)  11/21/21 137 lb 3.2 oz (62.2 kg)    BMI Readings from Last 5 Encounters:  05/20/22 24.37 kg/m  03/19/22 23.20 kg/m  02/26/22 24.60 kg/m  02/05/22 24.82 kg/m  11/21/21 25.09 kg/m     Physical Exam Sitting in chair, in no acute distress Eyes: EOMI, no icterus Neck: Supple, JVP Pulmonary: Normal work of breathing, clear Abdomen: Nondistended, bowel sounds present MSK: No synovitis, joint effusion Abdomen: Nondistended, bowel sounds present Neuro: Normal gait, no weakness Psych: Normal mood, full affect   Assessment & Plan:   Dyspnea on exertion: Likely related to cardiac causes as primary driver given left atrial dilation, elevated left atrial pressure, mitral and aortic valvular abnormalities with serial chest imaging demonstrating pulmonary edema.  Prior spirometry without fixed obstruction.  Full PFT 07/10/2021 concerning for moderate restriction with moderately decreased DLCO concerning for ILD.  CT high res 07/2021 rules out ILD, none present parenchyma is clear.  While TTE 06/2021 estimated normal RV pressures, preceding TTE 12/2020 and TTE 07/2021 demonstrate elevated RV pressures - so suspect 06/2021 results are spurious.  Overall, pulmonary testing is revealed that the lungs are small contributor to her dyspnea.  She has not  benefited inhaler therapy.  Large suspicion that cardiac is primary driver of her symptoms as well as sequela of ESRD on HD. Continue Stiolto. Referred to pulmonary rehab given worsening post Covid infection.  Chronic hypoxemic respiratory failure: 2-3 L with exertion per her report.  Using POC. No ILD on serial images including CT high res 07/2021.  Imaging consistently shows pulmonary edema and volume overload.  Recent hospitalization for worsening in the setting of volume overload and HTN urgency.  Possible  pulmonary hypertension: Multifactorial and largely by group 2 disease exacerbated by dialysis, grade 5 disease with possible increase post AV fistula, group 3 disease with hypoxemia.  Do not think she is good candidate for treatment of multifactorial nature of disease and dilated left atrium.   Lymphadenopathy: Subcarinal.  Stable 12/30/2020 to 10/30/2021.  Suspect this is reactive to consistent presence of chronic pulmonary venous congestion volume overload on serial images.  Can repeat scan 10/2022 and if enlarging will refer to oncology.  She has no concerning B symptoms etc.  Preoperative evaluation: Pulmonary medicine does not provide preoperative clearance rather preoperative risk assessment.  Based on the ARISCAT model, patient is intermediate risk or 13.4% risk of postoperative pulmonary complication if procedure is performed prior to June 02, 2022.  Given her preceding hospitalization in March.  If procedure is scheduled after June 02, 2022, patient is low or 1.3% risk of postoperative  pulmonary complication.   Return in about 6 months (around 11/19/2022).   Karren Burly, MD 05/20/2022   I spent 41 minutes in the care of the patient including face-to-face visit, review of records, coordination of care.

## 2022-05-20 NOTE — Patient Instructions (Signed)
Neck to see you again  Continue Stiolto  We will place a referral for pulmonary rehab.  This would help with your strength and stamina back.  We will forward the note to your eye surgeon.  Return to clinic in 6 months or sooner as needed with Dr. Judeth Horn

## 2022-06-05 ENCOUNTER — Ambulatory Visit: Payer: Medicare PPO | Attending: Cardiology | Admitting: Cardiology

## 2022-06-05 ENCOUNTER — Encounter: Payer: Self-pay | Admitting: Cardiology

## 2022-06-05 VITALS — BP 140/52 | HR 71 | Ht 62.0 in | Wt 138.0 lb

## 2022-06-05 DIAGNOSIS — I350 Nonrheumatic aortic (valve) stenosis: Secondary | ICD-10-CM | POA: Diagnosis not present

## 2022-06-05 DIAGNOSIS — Z951 Presence of aortocoronary bypass graft: Secondary | ICD-10-CM | POA: Diagnosis not present

## 2022-06-05 DIAGNOSIS — R079 Chest pain, unspecified: Secondary | ICD-10-CM | POA: Diagnosis not present

## 2022-06-05 NOTE — Patient Instructions (Signed)
Medication Instructions:  The current medical regimen is effective;  continue present plan and medications.  *If you need a refill on your cardiac medications before your next appointment, please call your pharmacy*  Testing/Procedures: A chest x-ray takes a picture of the organs and structures inside the chest, including the heart, lungs, and blood vessels. This test can show several things, including, whether the heart is enlarges; whether fluid is building up in the lungs; and whether pacemaker / defibrillator leads are still in place.  You have been referred to Structural Heart team for the evaluation and treatment options related to aortic stenosis.   Follow-Up: At [facility], you and your health needs are our priority.  As part of our continuing mission to provide you with exceptional heart care, we have created designated Provider Care Teams.  These Care Teams include your primary Cardiologist (physician) and Advanced Practice Providers (APPs -  Physician Assistants and Nurse Practitioners) who all work together to provide you with the care you need, when you need it.  We recommend signing up for the patient portal called "MyChart".  Sign up information is provided on this After Visit Summary.  MyChart is used to connect with patients for Virtual Visits (Telemedicine).  Patients are able to view lab/test results, encounter notes, upcoming appointments, etc.  Non-urgent messages can be sent to your provider as well.   To learn more about what you can do with MyChart, go to [email or web address].    Your next appointment:   6 month(s)  Provider:   Jari Favre, PA-C, Robin Searing, NP, Jacolyn Reedy, PA-C, Eligha Bridegroom, NP, or Tereso Newcomer, PA-C

## 2022-06-05 NOTE — Progress Notes (Signed)
Cardiology Office Note:    Date:  06/05/2022   ID:  [name], DOB Jan 10, 1961, MRN [id number]  PCP:  Laqueta Due., MD   [facility] Providers Cardiologist:  Donato Schultz, MD     Referring MD: Laqueta Due., MD     History of Present Illness:    Jodi Bell is a 62 y.o. female here to establish care following CABG times 2 on 09/13/20 by Dr. Laneta Simmers with a hx of arthritis, anxiety, anemia, asthma, back pain, here for the follow up of the of coronary artery disease, congestive heart failure, and hypertension. ESRD on HD.  Aortic stenosis severe from March 2024 echocardiogram at [facility].  She has been hospitalized a few times since her bypass surgery.  Has palpitations when on left side. Wire pain from CABG on right side.   Had mix up  in medications. BP was high. CP.   She is currently on dialysis on Mondays and Wednesday and Fridays. Her diabetes has improved after weight loss. Her left knee was partially replaced, and she is going to the orthopedics later today for assessment of her right knee. She adds that her lower back/spine has been giving her discomfort.  She used to smoke, but she currently no longer smokes following bypass.    Past Medical History:  Diagnosis Date   Anemia of chronic disease    Anxiety    Arthritis    oa and psoriatic ra   Asthma    Back pain, chronic    lower back   Chronic insomnia    COPD (chronic obstructive pulmonary disease)    Diabetes mellitus    type 2   ESRD on dialysis    Essential hypertension    GERD (gastroesophageal reflux disease)    Gout    History of hiatal hernia    Hypoalbuminemia    Hyponatremia    Mild aortic stenosis    Mitral regurgitation    Mitral stenosis    Obesity    PAD (peripheral artery disease)    a. externial iliac calcification seen on CT 04/2020.   Sleep apnea    Tobacco abuse    Upper GI bleed 05/2019    Past Surgical History:  Procedure Laterality Date   BACK SURGERY  2016   lower back  fusion with cage   BIOPSY  09/23/2017   Procedure: BIOPSY;  Surgeon: Graylin Shiver, MD;  Location: [facility] ENDOSCOPY;  Service: Endoscopy;;   BIOPSY  05/19/2019   Procedure: BIOPSY;  Surgeon: Kathi Der, MD;  Location: [facility] ENDOSCOPY;  Service: Gastroenterology;;   CESAREAN SECTION  1990   x 1    COLONOSCOPY WITH PROPOFOL N/A 09/23/2017   Procedure: COLONOSCOPY WITH PROPOFOL Hemostatic clips placed;  Surgeon: Graylin Shiver, MD;  Location: [facility] ENDOSCOPY;  Service: Endoscopy;  Laterality: N/A;   COLONOSCOPY WITH PROPOFOL N/A 10/02/2020   Procedure: COLONOSCOPY WITH PROPOFOL;  Surgeon: Willis Modena, MD;  Location: [facility] ENDOSCOPY;  Service: Endoscopy;  Laterality: N/A;   CORONARY ARTERY BYPASS GRAFT N/A 09/13/2020   Procedure: CORONARY ARTERY BYPASS GRAFTING (CABG), ON PUMP, TIMES TWO, USING LEFT INTERNAL MAMMARY ARTERY AND RIGHT ENDOSCOPICALLY HARVESTED GREATER SAPHENOUS VEIN;  Surgeon: Alleen Borne, MD;  Location: [facility] OR;  Service: Open Heart Surgery;  Laterality: N/A;   DILATION AND CURETTAGE OF UTERUS  1988   ENTEROSCOPY N/A 07/22/2021   Procedure: ENTEROSCOPY;  Surgeon: Kerin Salen, MD;  Location: [facility] ENDOSCOPY;  Service: Gastroenterology;  Laterality: N/A;  ESOPHAGOGASTRODUODENOSCOPY N/A 09/22/2020   Procedure: ESOPHAGOGASTRODUODENOSCOPY (EGD);  Surgeon: Willis Modena, MD;  Location: [facility] ENDOSCOPY;  Service: Endoscopy;  Laterality: N/A;   ESOPHAGOGASTRODUODENOSCOPY (EGD) WITH PROPOFOL N/A 09/23/2017   Procedure: ESOPHAGOGASTRODUODENOSCOPY (EGD) WITH PROPOFOL;  Surgeon: Graylin Shiver, MD;  Location: [facility] ENDOSCOPY;  Service: Endoscopy;  Laterality: N/A;   ESOPHAGOGASTRODUODENOSCOPY (EGD) WITH PROPOFOL N/A 05/19/2019   Procedure: ESOPHAGOGASTRODUODENOSCOPY (EGD) WITH PROPOFOL;  Surgeon: Kathi Der, MD;  Location: [facility] ENDOSCOPY;  Service: Gastroenterology;  Laterality: N/A;   ESOPHAGOGASTRODUODENOSCOPY (EGD) WITH PROPOFOL N/A 10/02/2020   Procedure: ESOPHAGOGASTRODUODENOSCOPY (EGD) WITH PROPOFOL;   Surgeon: Willis Modena, MD;  Location: [facility] ENDOSCOPY;  Service: Endoscopy;  Laterality: N/A;   GIVENS CAPSULE STUDY N/A 05/19/2019   Procedure: GIVENS CAPSULE STUDY;  Surgeon: Kathi Der, MD;  Location: [facility] ENDOSCOPY;  Service: Gastroenterology;  Laterality: N/A;   GIVENS CAPSULE STUDY N/A 07/19/2021   Procedure: GIVENS CAPSULE STUDY;  Surgeon: [name], MD;  Location: [facility] ENDOSCOPY;  Service: Gastroenterology;  Laterality: N/A;   HEMOSTASIS CLIP PLACEMENT  09/22/2020   Procedure: HEMOSTASIS CLIP PLACEMENT;  Surgeon: Willis Modena, MD;  Location: [facility] ENDOSCOPY;  Service: Endoscopy;;   HEMOSTASIS CONTROL  09/22/2020   Procedure: HEMOSTASIS CONTROL;  Surgeon: Willis Modena, MD;  Location: [facility] ENDOSCOPY;  Service: Endoscopy;;   HOT HEMOSTASIS N/A 05/19/2019   Procedure: HOT HEMOSTASIS (ARGON PLASMA COAGULATION/BICAP);  Surgeon: Kathi Der, MD;  Location: [facility] ENDOSCOPY;  Service: Gastroenterology;  Laterality: N/A;   LEFT HEART CATH AND CORONARY ANGIOGRAPHY N/A 09/10/2020   Procedure: LEFT HEART CATH AND CORONARY ANGIOGRAPHY;  Surgeon: Yvonne Kendall, MD;  Location: [facility] INVASIVE CV LAB;  Service: Cardiovascular;  Laterality: N/A;   PARTIAL KNEE ARTHROPLASTY Left 11/12/2017   Procedure: left unicompartmental arthroplasty-medial;  Surgeon: Durene Romans, MD;  Location: [facility] ORS;  Service: Orthopedics;  Laterality: Left;    SUBMUCOSAL INJECTION  09/23/2017   Procedure: SUBMUCOSAL INJECTION;  Surgeon: Graylin Shiver, MD;  Location: [facility] ENDOSCOPY;  Service: Endoscopy;;  in colon   TEE WITHOUT CARDIOVERSION N/A 09/13/2020   Procedure: TRANSESOPHAGEAL ECHOCARDIOGRAM (TEE);  Surgeon: Alleen Borne, MD;  Location: [name] OR;  Service: Open Heart Surgery;  Laterality: N/A;   TUBAL LIGATION  1990    Current Medications: Current Meds  Medication Sig   acetaminophen (TYLENOL) 500 MG tablet Take 1,000 mg by mouth daily as needed (pain).   albuterol (PROVENTIL) (2.5 MG/3ML) 0.083% nebulizer solution Take  3 mLs (2.5 mg total) by nebulization every 6 (six) hours as needed for wheezing or shortness of breath.   allopurinol (ZYLOPRIM) 100 MG tablet Take 100 mg by mouth every evening.   carboxymethylcellulose 1 % ophthalmic solution Place 2 drops into both eyes 2 (two) times daily as needed (dry eyes).   carvedilol (COREG) 25 MG tablet Take 25 mg by mouth 2 (two) times daily with a meal.   cetirizine (ZYRTEC) 10 MG tablet Take 10 mg by mouth daily as needed for allergies.   cholecalciferol (VITAMIN D) 25 MCG tablet Take 1 tablet (1,000 Units total) by mouth every evening. (Patient taking differently: Take 1,000 Units by mouth See admin instructions. Take one tablet by mouth on Monday Wednesdays and Fridays)   cholecalciferol (VITAMIN D3) 10 MCG (400 UNIT) TABS tablet Take 800 Units by mouth daily.   cinacalcet (SENSIPAR) 60 MG tablet Take 60 mg by mouth every evening.   cloNIDine (CATAPRES - DOSED IN MG/24 HR) 0.3 mg/24hr patch 0.3 mg once a week.   cyclobenzaprine (FLEXERIL) 10 MG tablet Take  10 mg by mouth 2 (two) times daily as needed for muscle spasms.   Darbepoetin Alfa (ARANESP) 200 MCG/0.4ML SOSY injection Inject 0.4 mLs (200 mcg total) into the vein every Wednesday with hemodialysis.   diclofenac Sodium (VOLTAREN) 1 % GEL Apply 2 g topically daily as needed (pain).   docusate sodium (COLACE) 100 MG capsule Take 100 mg by mouth daily as needed for mild constipation.   EPINEPHrine 0.3 mg/0.3 mL IJ SOAJ injection Inject into the muscle as needed for anaphylaxis.   esomeprazole (NEXIUM) 20 MG capsule Take by mouth as needed (heartburn).   ferrous sulfate 325 (65 FE) MG tablet Take 325 mg by mouth See admin instructions. Take 1 tablet by mouth three times a week on Tues, Thur, Sat only per patient   folic acid (FOLVITE) 400 MCG tablet Take 400 mcg by mouth daily.   heparin 1000 unit/mL SOLN injection 1 mL (1,000 Units total) by Dialysis route as needed (in dialysis).   hydrALAZINE (APRESOLINE) 100 MG  tablet Take 1 tablet (100 mg total) by mouth every 8 (eight) hours. (Patient taking differently: Take 100 mg by mouth 3 (three) times daily.)   hydrocortisone (ANUSOL-HC) 2.5 % rectal cream Place rectally 2 (two) times daily. (Patient taking differently: Place 1 Application rectally daily as needed for hemorrhoids or anal itching.)   isosorbide mononitrate (IMDUR) 60 MG 24 hr tablet Take 1 tablet (60 mg total) by mouth daily.   LORazepam (ATIVAN) 1 MG tablet Take 1 mg by mouth every 8 (eight) hours as needed for anxiety.   montelukast (SINGULAIR) 10 MG tablet Take 10 mg by mouth daily with supper.    NIFEdipine (PROCARDIA XL/NIFEDICAL XL) 60 MG 24 hr tablet Take 60 mg by mouth 2 (two) times daily.   olmesartan (BENICAR) 40 MG tablet Take 40 mg by mouth every evening.   ondansetron (ZOFRAN) 4 MG tablet Take 4 mg by mouth 2 (two) times daily as needed for nausea or vomiting.   OVER THE COUNTER MEDICATION Take by mouth daily as needed (sickness). Elderberry Fruit and flower 1 dropper full by mouth daily as needed for sickness   Oxycodone HCl 10 MG TABS Take 10 mg by mouth daily as needed for pain.   OXYGEN Inhale 2 L into the lungs as directed.   pantoprazole (PROTONIX) 40 MG tablet Take 40 mg by mouth daily.   predniSONE (DELTASONE) 5 MG tablet Take 1 tablet (5 mg total) by mouth daily with breakfast.   Probiotic Product (PROBIOTIC PO) Take 1 capsule by mouth daily.   sorbitol 70 % SOLN Take 30 ml 1-2 times daily as needed for constipation   Tiotropium Bromide-Olodaterol (STIOLTO RESPIMAT) 2.5-2.5 MCG/ACT AERS Inhale 2 puffs once daily into the lungs.   VENTOLIN HFA 108 (90 Base) MCG/ACT inhaler Inhale 2 puffs into the lungs See admin instructions. Take 2 puffs every 4-6 hours as needed for wheezing and shortness of breath   zolpidem (AMBIEN) 5 MG tablet Take 5 mg by mouth at bedtime as needed for sleep.     Allergies:   3-methyl-2-benzothiazolinone hydrazone, Banana, Black walnut flavor, Hazelnut  (filbert), Leflunomide and related, Lisinopril, No healthtouch food allergies, Other, Pecan extract, Pecan nut (diagnostic), Trazodone and nefazodone, Adalimumab, Escitalopram oxalate, Ezetimibe, Secukinumab, Statins, Ferrlecit [na ferric gluc cplx in sucrose], Gabapentin, Ibuprofen, and Duloxetine   Social History   Socioeconomic History   Marital status: Divorced    Spouse name: BASIL   Number of children: 3   Years of education: 1  Highest education level: GED or equivalent  Occupational History   Occupation: retired Public house manager  Tobacco Use   Smoking status: Former    Packs/day: 0.50    Years: 40.00    Additional pack years: 0.00    Total pack years: 20.00    Types: Cigarettes    Quit date: 08/2020    Years since quitting: 1.8   Smokeless tobacco: Never  Vaping Use   Vaping Use: Never used  Substance and Sexual Activity   Alcohol use: Yes    Comment: occ   Drug use: No   Sexual activity: Not on file  Other Topics Concern   Not on file  Social History Narrative   Not on file   Social Determinants of Health   Financial Resource Strain: Low Risk  (09/15/2018)   Overall Financial Resource Strain (CARDIA)    Difficulty of Paying Living Expenses: Not hard at all  Food Insecurity: No Food Insecurity (02/26/2022)   Hunger Vital Sign    Worried About Running Out of Food in the Last Year: Never true    Ran Out of Food in the Last Year: Never true  Transportation Needs: No Transportation Needs (02/26/2022)   PRAPARE - Administrator, Civil Service (Medical): No    Lack of Transportation (Non-Medical): No  Physical Activity: Inactive (09/15/2018)   Exercise Vital Sign    Days of Exercise per Week: 0 days    Minutes of Exercise per Session: 0 min  Stress: No Stress Concern Present (09/15/2018)   [employer] of Occupational Health - Occupational Stress Questionnaire    Feeling of Stress : Not at all  Social Connections: Moderately Isolated (09/15/2018)   Social Connection  and Isolation Panel [NHANES]    Frequency of Communication with Friends and Family: More than three times a week    Frequency of Social Gatherings with Friends and Family: Three times a week    Attends Religious Services: More than 4 times per year    Active Member of Clubs or Organizations: No    Attends Banker Meetings: Never    Marital Status: Divorced     Family History: The patient's family history includes Arthritis in her daughter and daughter; Asthma in her daughter; Diabetes in her sister; Heart disease in her sister and another family member; Lung cancer in her mother; Thyroid cancer in an other family member.  ROS:   Please see the history of present illness.    All other systems reviewed and are negative.  EKGs/Labs/Other Studies Reviewed:    The following studies were reviewed today: Operative report  Cardiac Studies & Procedures   CARDIAC CATHETERIZATION  CARDIAC CATHETERIZATION 09/10/2020  Narrative Conclusions: Severe two-vessel coronary artery disease with heavily calcified disease of up to 80% involving the proximal and mid LAD as well as up to 90% involving the mid RCA. Mild-moderate, non-obstructive disease noted in the LCx territory. Mildly elevated left ventricular filling pressure (LVEDP 15-20 mmHg).  Recommendations: Given severe two-vessel CAD with heavy, eccentric calcium (including in the proximal LAD) and the patient's history of diabetes, recommend cardiac surgery consultation for CABG. Improve blood pressure control and wean IV nitroglycerin, as tolerated.  Yvonne Kendall, MD [facility]  Findings Coronary Findings Diagnostic  Dominance: Right  Left Main Vessel is large. Vessel is angiographically normal.  Left Anterior Descending Vessel is large. Prox LAD to Mid LAD lesion is 80% stenosed. The lesion is eccentric. The lesion is severely calcified. Mid LAD-1 lesion is  70% stenosed. The lesion is focal and concentric. The  lesion is moderately calcified. Mid LAD-2 lesion is 30% stenosed.  First Diagonal Branch Vessel is moderate in size. 1st Diag lesion is 50% stenosed.  Second Diagonal Branch Vessel is moderate in size.  Third Diagonal Branch Vessel is small in size.  Left Circumflex Vessel is large. There is mild diffuse disease throughout the vessel.  First Obtuse Marginal Branch Vessel is small in size.  Second Obtuse Marginal Branch Vessel is large in size. 2nd Mrg lesion is 40% stenosed.  Right Coronary Artery Vessel is large. The vessel is calcified. The vessel is mildly tortuous. Prox RCA lesion is 20% stenosed. The lesion is eccentric. The lesion is moderately calcified. Mid RCA lesion is 95% stenosed. The lesion is eccentric. The lesion is severely calcified. Dist RCA lesion is 30% stenosed. The lesion is eccentric. The lesion is calcified.  Right Posterior Descending Artery Vessel is large in size.  Right Posterior Atrioventricular Artery Vessel is large in size.  First Right Posterolateral Branch Vessel is small in size.  Second Right Posterolateral Branch Vessel is large in size.  Third Right Posterolateral Branch Vessel is small in size.  Intervention  No interventions have been documented.     ECHOCARDIOGRAM  ECHOCARDIOGRAM COMPLETE 07/17/2021  Narrative ECHOCARDIOGRAM REPORT    Patient Name:   Jodi Bell Date of Exam: 07/17/2021 Medical Rec #:  [id number]        Height:       63.0 in Accession #:    [id number]       Weight:       144.4 lb Date of Birth:  [birth year]         BSA:          1.684 m Patient Age:    61 years         BP:           179/71 mmHg Patient Gender: F                HR:           71 bpm. Exam Location:  Inpatient  Procedure: 2D Echo, Cardiac Doppler and Color Doppler  Indications:     Chest pain  History:         Patient has prior history of Echocardiogram examinations, most recent 06/26/2021. CHF, CAD; Risk Factors:Hypertension,  Diabetes and Sleep Apnea.  Sonographer:     Eduard Roux Referring Phys:  Lora Paula RAY Diagnosing Phys: Laurance Flatten MD  IMPRESSIONS   1. Left ventricular ejection fraction, by estimation, is 65 to 70%. The left ventricle has normal function. The left ventricle has no regional wall motion abnormalities. There is severe concentric left ventricular hypertrophy. Left ventricular diastolic parameters are consistent with Grade II diastolic dysfunction (pseudonormalization). Elevated left atrial pressure. 2. Right ventricular systolic function is mildly reduced. The right ventricular size is normal. There is moderately elevated pulmonary artery systolic pressure. The estimated right ventricular systolic pressure is 55.2 mmHg which is higher than previous TTE on 06/26/21 at . May be related to volume overload, however, PE is on the differential. Recommend clinical correlation. 3. Left atrial size was severely dilated. 4. Right atrial size was mildly dilated. 5. The mitral valve is degenerative. Mild mitral valve regurgitation. Severe mitral annular calcification. 6. Tricuspid valve regurgitation is mild to moderate. 7. The aortic valve is tricuspid. There is moderate calcification of the aortic valve. There is moderate thickening of the aortic valve.  Aortic valve regurgitation is not visualized. Moderate aortic valve stenosis. Aortic valve mean gradient measures 36.0 mmHg. Aortic valve Vmax measures 4.11 m/s. AVA by continuity 1.4cm2, DI 0.53m/s. Suspect elevated mean gradient and Vmax in the setting of high output with LVOT VTI 40.4cm. 8. Aortic dilatation noted. There is borderline dilatation of the ascending aorta, measuring 39 mm. 9. The inferior vena cava is dilated in size with <50% respiratory variability, suggesting right atrial pressure of 15 mmHg.  Comparison(s): Compared to prior TTE on 06/26/21, the PASP is now 55.66mmHg from 34.44mmHg and the RV is mildly hypokinetic.  Elevated PASP may be related to volume overload, however, PE is on the differential. Otherwise, there continues to be moderate AS with similar gradients.  FINDINGS Left Ventricle: Left ventricular ejection fraction, by estimation, is 65 to 70%. The left ventricle has normal function. The left ventricle has no regional wall motion abnormalities. The left ventricular internal cavity size was normal in size. There is severe concentric left ventricular hypertrophy. Left ventricular diastolic parameters are consistent with Grade II diastolic dysfunction (pseudonormalization). Elevated left atrial pressure.  Right Ventricle: The right ventricular size is normal. No increase in right ventricular wall thickness. Right ventricular systolic function is mildly reduced. There is moderately elevated pulmonary artery systolic pressure. The tricuspid regurgitant velocity is 3.17 m/s, and with an assumed right atrial pressure of 15 mmHg, the estimated right ventricular systolic pressure is 55.2 mmHg.  Left Atrium: Left atrial size was severely dilated.  Right Atrium: Right atrial size was mildly dilated.  Pericardium: There is no evidence of pericardial effusion.  Mitral Valve: The mitral valve is degenerative in appearance. Severe mitral annular calcification. Mild mitral valve regurgitation.  Tricuspid Valve: The tricuspid valve is normal in structure. Tricuspid valve regurgitation is mild to moderate.  Aortic Valve: The aortic valve is tricuspid. There is moderate calcification of the aortic valve. There is moderate thickening of the aortic valve. Aortic valve regurgitation is not visualized. Moderate aortic stenosis is present. Aortic valve mean gradient measures 36.0 mmHg. Aortic valve peak gradient measures 67.6 mmHg. Aortic valve area, by VTI measures 1.33 cm.  Pulmonic Valve: The pulmonic valve was normal in structure. Pulmonic valve regurgitation is trivial.  Aorta: Aortic dilatation noted. There  is borderline dilatation of the ascending aorta, measuring 39 mm.  Venous: The inferior vena cava is dilated in size with less than 50% respiratory variability, suggesting right atrial pressure of 15 mmHg.  IAS/Shunts: The atrial septum is grossly normal.   LEFT VENTRICLE PLAX 2D LVIDd:         4.50 cm   Diastology LVIDs:         2.80 cm   LV e' medial:    5.06 cm/s LV PW:         1.80 cm   LV E/e' medial:  35.2 LV IVS:        1.50 cm   LV e' lateral:   8.03 cm/s LVOT diam:     1.90 cm   LV E/e' lateral: 22.2 LV SV:         115 LV SV Index:   68 LVOT Area:     2.84 cm   RIGHT VENTRICLE            IVC RV Basal diam:  3.10 cm    IVC diam: 2.60 cm RV S prime:     5.12 cm/s TAPSE (M-mode): 1.2 cm  LEFT ATRIUM  Index        RIGHT ATRIUM           Index LA diam:        4.80 cm  2.85 cm/m   RA Area:     19.50 cm LA Vol (A2C):   112.0 ml 66.52 ml/m  RA Volume:   53.70 ml  31.89 ml/m LA Vol (A4C):   103.0 ml 61.18 ml/m LA Biplane Vol: 109.0 ml 64.74 ml/m AORTIC VALVE                     PULMONIC VALVE AV Area (Vmax):    1.44 cm      PV Vmax:       0.80 m/s AV Area (Vmean):   1.55 cm      PV Peak grad:  2.6 mmHg AV Area (VTI):     1.33 cm AV Vmax:           411.00 cm/s AV Vmean:          262.000 cm/s AV VTI:            0.858 m AV Peak Grad:      67.6 mmHg AV Mean Grad:      36.0 mmHg LVOT Vmax:         209.00 cm/s LVOT Vmean:        143.000 cm/s LVOT VTI:          0.404 m LVOT/AV VTI ratio: 0.47  AORTA Ao Root diam: 3.10 cm Ao Asc diam:  3.90 cm  MITRAL VALVE                TRICUSPID VALVE MV Area (PHT): 4.41 cm     TR Peak grad:   40.2 mmHg MV Decel Time: 172 msec     TR Vmax:        317.00 cm/s MR Peak grad: 71.2 mmHg MR Mean grad: 6.0 mmHg      SHUNTS MR Vmax:      422.00 cm/s   Systemic VTI:  0.40 m MR Vmean:     114.0 cm/s    Systemic Diam: 1.90 cm MV E velocity: 178.00 cm/s MV A velocity: 113.00 cm/s MV E/A ratio:  1.58  Laurance Flatten  MD Electronically signed by Laurance Flatten MD Signature Date/Time: 07/17/2021/10:37:52 AM    Final (Updated)   TEE  ECHO INTRAOPERATIVE TEE 09/18/2020  Narrative *INTRAOPERATIVE TRANSESOPHAGEAL REPORT *    Patient Name:   Jodi Bell Date of Exam: 09/13/2020 Medical Rec #:  [id number]        Height:       62.0 in Accession #:    [id number]       Weight:       135.2 lb Date of Birth:  11-Nov-1960         BSA:          1.62 m Patient Age:    60 years         BP:           188/76 mmHg Patient Gender: F                HR:           63 bpm. Exam Location:  Anesthesiology  Transesophogeal exam was perform intraoperatively during surgical procedure. Patient was closely monitored under general anesthesia during the entirety of examination.  Indications:     Coronary artery disease Performing Phys: [id number] BRYAN K  BARTLE Diagnosing Phys: Lewie Loron MD  Complications: No known complications during this procedure. POST-OP IMPRESSIONS _ Left Ventricle: The left ventricle is unchanged from pre-bypass. _ Right Ventricle: The right ventricle appears unchanged from pre-bypass. _ Aorta: The aorta appears unchanged from pre-bypass. _ Left Atrial Appendage: The left atrial appendage appears unchanged from pre-bypass. _ Aortic Valve: The aortic valve appears unchanged from pre-bypass. _ Mitral Valve: The mitral valve appears unchanged from pre-bypass. _ Tricuspid Valve: The tricuspid valve appears unchanged from pre-bypass. _ Pulmonic Valve: The pulmonic valve appears unchanged from pre-bypass.  PRE-OP FINDINGS Left Ventricle: The left ventricle has normal systolic function, with an ejection fraction of 55-60%. The cavity size was decreased. There is severe concentric left ventricular hypertrophy.   Right Ventricle: The right ventricle has normal systolic function. The cavity was normal. There is no increase in right ventricular wall thickness.  Left Atrium: Left atrial size was  dilated. No left atrial/left atrial appendage thrombus was detected.  Right Atrium: Right atrial size was normal in size.  Interatrial Septum: No atrial level shunt detected by color flow Doppler.  Pericardium: There is no evidence of pericardial effusion.  Mitral Valve: The mitral valve is normal in structure. Mitral valve regurgitation is mild by color flow Doppler. The MR jet is centrally-directed.  Tricuspid Valve: The tricuspid valve was normal in structure. Tricuspid valve regurgitation is mild by color flow Doppler. There is no evidence of tricuspid valve vegetation.  Aortic Valve: The aortic valve is tricuspid Aortic valve regurgitation is trivial by color flow Doppler. There is mild stenosis of the aortic valve. There is mild thickening and mild calcification present on the aortic valve right coronary cusp with moderately decreased mobility and there is mild thickening and mild calcification present on the aortic valve left coronary cusp with mildly decreased mobility and there is mild thickening and mild calcification present on the aortic valve non-coronary cusp with mildly decreased mobility.  Pulmonic Valve: The pulmonic valve was normal in structure. Pulmonic valve regurgitation is trivial by color flow Doppler.   Aorta: The aorta was not well visualized. There is evidence of plaque in the aortic arch; Grade I, measuring 1-64mm in size.  +-------------+---------++ AORTIC VALVE           +-------------+---------++ AV Mean Grad:18.0 mmHg +-------------+---------++   Lewie Loron MD Electronically signed by Lewie Loron MD Signature Date/Time: 09/13/2020/3:11:29 PM    Final (Updated)            Echocardiogram from [facility] on 04/15/2022 from care everywhere: The ejection fraction is greater than 60%. Moderate concentric left ventricular hypertrophy. Severe calcific aortic stenosis is present.  Mean and maximum pressure gradient are 37 mmHg and 74 mmHg,  respectively.  Maximum velocity is 430 cm/s. Moderate to severe tricuspid regurgitation. Mild mitral regurgitation. Atrial enlargement. Moderately elevated estimated right ventricular systolic pressure suggestive of pulmonary hypertension. Narrative  This result has an attachment that is not available. Normal left ventricular systolic function. Left Ventricle Left ventricle size is normal. There is moderate concentric hypertrophy. EF: 60-65%. Wall motion is normal. Doppler parameters indicate normal diastolic function.  Right Ventricle Right ventricle size is normal. Systolic function is normal.  Left Atrium Left atrium is moderately dilated at 4.800 cm.  Right Atrium Right atrium is moderately dilated.  IVC/SVC The inferior vena cava demonstrates a diameter of >2.1 cm and collapses <50%; therefore, the right atrial pressure is estimated at 15 mmHg.  Mitral Valve Mitral valve structure is normal. The leaflets are  moderately thickened. There is mild regurgitation. There is no evidence of mitral valve stenosis.  Tricuspid Valve Tricuspid valve structure is normal. There is moderate to severe regurgitation. The right ventricular systolic pressure is moderately elevated (50-59 mmHg).  Aortic Valve The aortic valve is tricuspid. The leaflets are moderately thickened and exhibit mildly reduced excursion. The leaflets are mildly calcified. There is no regurgitation. There is severe stenosis, with peak and mean gradients of 66.000 and 37.000 mmHg.  Pulmonic Valve The pulmonic valve was not well visualized. Trace regurgitation. There is no evidence of pulmonic valve stenosis.  Ascending Aorta The aortic root is normal in size. The ascending aorta is normal in size.  Pericardium There is no pericardial effusion.  Study Details A complete echo was performed using complete 2D, color flow Doppler and spectral Doppler. During the study the apical, parasternal, subcostal and  suprasternal views were captured. Overall the study quality was adequate. The imaging is on file and stored in a permanent location.  Echo 07/2021 IMPRESSIONS     1. Left ventricular ejection fraction, by estimation, is 65 to 70%. The  left ventricle has normal function. The left ventricle has no regional  wall motion abnormalities. There is severe concentric left ventricular  hypertrophy. Left ventricular diastolic   parameters are consistent with Grade II diastolic dysfunction  (pseudonormalization). Elevated left atrial pressure.   2. Right ventricular systolic function is mildly reduced. The right  ventricular size is normal. There is moderately elevated pulmonary artery  systolic pressure. The estimated right ventricular systolic pressure is  55.2 mmHg which is higher than previous   TTE on 06/26/21 at . May be related to volume overload, however, PE  is on the differential. Recommend clinical correlation.   3. Left atrial size was severely dilated.   4. Right atrial size was mildly dilated.   5. The mitral valve is degenerative. Mild mitral valve regurgitation.  Severe mitral annular calcification.   6. Tricuspid valve regurgitation is mild to moderate.   7. The aortic valve is tricuspid. There is moderate calcification of the  aortic valve. There is moderate thickening of the aortic valve. Aortic  valve regurgitation is not visualized. Moderate aortic valve stenosis.  Aortic valve mean gradient measures  36.0 mmHg. Aortic valve Vmax measures 4.11 m/s. AVA by continuity 1.4cm2,  DI 0.47m/s. Suspect elevated mean gradient and Vmax in the setting of high  output with LVOT VTI 40.4cm.   8. Aortic dilatation noted. There is borderline dilatation of the  ascending aorta, measuring 39 mm.   9. The inferior vena cava is dilated in size with <50% respiratory  variability, suggesting right atrial pressure of 15 mmHg.   Comparison(s): Compared to prior TTE on 06/26/21, the PASP is  now 55.11mmHg  from 34.13mmHg and the RV is mildly hypokinetic. Elevated PASP may be  related to volume overload, however, PE is on the differential. Otherwise,  there continues to be moderate AS  with similar gradients.   01/02/21 ECHO:   1. Left ventricular ejection fraction, by estimation, is 65 to 70%. The  left ventricle has normal function. The left ventricle has no regional  wall motion abnormalities. There is severe left ventricular hypertrophy.  Left ventricular diastolic parameters   are consistent with Grade II diastolic dysfunction (pseudonormalization).  Elevated left atrial pressure.   2. Right ventricular systolic function is mildly reduced. The right  ventricular size is mildly enlarged. There is moderately elevated  pulmonary artery systolic pressure. The estimated right ventricular  systolic pressure is 46.4 mmHg.   3. Left atrial size was severely dilated.   4. Right atrial size was mildly dilated.   5. The mitral valve is degenerative. Mild mitral valve regurgitation.  Mild mitral stenosis. MG 5 mmHg at 65bpm, MVA 1.6 cm^2 by continuity  equation. Severe mitral annular calcification.   6. Tricuspid valve regurgitation is mild to moderate.   7. The aortic valve is tricuspid. There is moderate calcification of the  aortic valve. Aortic valve regurgitation is not visualized. Moderate  aortic valve stenosis. Vmax 3.2 m/s, MG 23 mmHg, AVA 1.2 cm^2, DI 0.5   8. The inferior vena cava is dilated in size with <50% respiratory  variability, suggesting right atrial pressure of 15 mmHg.   EKG:    01/10/2021: EKG was not ordered.  Recent Labs: 10/20/2021: B Natriuretic Peptide >4,500.0 03/18/2022: Magnesium 1.9; TSH 3.789 03/19/2022: ALT 9; Platelets 179 03/20/2022: BUN 20; Creatinine, Ser 4.68; Hemoglobin 11.7; Potassium 4.2; Sodium 132  Recent Lipid Panel    Component Value Date/Time   CHOL 131 07/16/2021 1316   TRIG 34 07/16/2021 1316   HDL 55 07/16/2021 1316   CHOLHDL  2.4 07/16/2021 1316   VLDL 7 07/16/2021 1316   LDLCALC 69 07/16/2021 1316           Physical Exam:    VS:  BP (!) 140/52   Pulse 71   Ht 5\' 2"  (1.575 m)   Wt 138 lb (62.6 kg)   SpO2 93%   BMI 25.24 kg/m     Wt Readings from Last 3 Encounters:  06/05/22 138 lb (62.6 kg)  05/20/22 137 lb 9.6 oz (62.4 kg)  03/19/22 130 lb 15.3 oz (59.4 kg)     GEN: Well nourished, well developed in no acute distress HEENT: Normal NECK: No JVD; bilateral carotid bruits LYMPHATICS: No lymphadenopathy CARDIAC: RRR, 3/6 systolic murmur, rubs, gallops  RESPIRATORY:  Clear to auscultation without rales, wheezing or rhonchi  ABDOMEN: Soft, non-tender, non-distended MUSCULOSKELETAL:  No edema; No deformity  SKIN: Warm and dry NEUROLOGIC:  Alert and oriented x 3 PSYCHIATRIC:  Normal affect   ASSESSMENT:    1. Severe aortic stenosis   2. Chest pain of uncertain etiology   3. S/P CABG x 2     PLAN:     Coronary artery disease involving coronary bypass graft of native heart with angina pectoris (HCC) CABG times 2, 09/13/20 Dr. Laneta Simmers.  Her isosorbide had been decreased to 30 mg a day.  Excellent.  She does have some more right of sternal chest discomfort, worried about sternotomy she states.  We will check a chest x-ray PA and lateral to ensure stability.  Prior chest x-ray showed no abnormalities.  COPD (chronic obstructive pulmonary disease) (HCC) She quit smoking surrounding her bypass.  Tobacco use She quit smoking surrounding her bypass.  Paroxysmal atrial fibrillation (HCC) She is in sinus rhythm.  Prior EKG from last week personally reviewed and interpreted, sinus rhythm.  Previously stopped her amiodarone 200 mg a day.  She had postoperative atrial fibrillation.  Aortic stenosis severe At the time of bypass surgery in 2022, in review of op note, valve leaflets were felt to be normal mild stenotic.  Most recent echocardiogram shows severe aortic stenosis with mean gradient above 37  mmHg.  See care everywhere above.  I will have her see the structural heart team to discuss potential TAVR.  Understand that with her hemodialysis, this places her at increased risk for perioperative complications  including infections.    Mitral stenosis Mild mitral stenosis.  Continue to monitor.  Low mean gradient of approximately 5 mmHg.  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 2 diabetes mellitus [facility]) On hemodialysis Monday Wednesday Friday.  Blood sugars have markedly improved with weight loss.  OSA (obstructive sleep apnea) She has not been utilizing CPAP mask because of infection she states.  GI bleeding Prior history of [place] lesion postoperatively.  Diabetes mellitus with coincident hypertension (HCC) Improved with weight loss.          Cardiac Rehabilitation Eligibility Assessment        6 month  Medication Adjustments/Labs and Tests Ordered: Current medicines are reviewed at length with the patient today.  Concerns regarding medicines are outlined above.  Orders Placed This Encounter  Procedures   DG Chest 2 View   Ambulatory referral to Structural Heart/Valve Clinic (only at [facility])   No orders of the defined types were placed in this encounter.    Patient Instructions  Medication Instructions:  The current medical regimen is effective;  continue present plan and medications.  *If you need a refill on your cardiac medications before your next appointment, please call your pharmacy*  Testing/Procedures: A chest x-ray takes a picture of the organs and structures inside the chest, including the heart, lungs, and blood vessels. This test can show several things, including, whether the heart is enlarges; whether fluid is building up in the lungs; and whether pacemaker / defibrillator leads are still in place.  You have been referred to Structural Heart team for the evaluation and treatment options related to aortic stenosis.   Follow-Up: At [facility],  you and your health needs are our priority.  As part of our continuing mission to provide you with exceptional heart care, we have created designated Provider Care Teams.  These Care Teams include your primary Cardiologist (physician) and Advanced Practice Providers (APPs -  Physician Assistants and Nurse Practitioners) who all work together to provide you with the care you need, when you need it.  We recommend signing up for the patient portal called "MyChart".  Sign up information is provided on this After Visit Summary.  MyChart is used to connect with patients for Virtual Visits (Telemedicine).  Patients are able to view lab/test results, encounter notes, upcoming appointments, etc.  Non-urgent messages can be sent to your provider as well.   To learn more about what you can do with MyChart, go to [email or web address].    Your next appointment:   6 month(s)  Provider:   Jari Favre, PA-C, Robin Searing, NP, Jacolyn Reedy, PA-C, Eligha Bridegroom, NP, or Tereso Newcomer, PA-C             Signed, Donato Schultz, MD  06/05/2022 12:26 PM    [facility]

## 2022-06-16 ENCOUNTER — Other Ambulatory Visit: Payer: Self-pay

## 2022-06-16 ENCOUNTER — Inpatient Hospital Stay
Admission: RE | Admit: 2022-06-16 | Discharge: 2022-06-16 | Disposition: A | Discharge status: Home / Self Care | Payer: Self-pay | Source: Ambulatory Visit | Attending: Cardiology | Admitting: Cardiology

## 2022-06-16 DIAGNOSIS — I350 Nonrheumatic aortic (valve) stenosis: Secondary | ICD-10-CM

## 2022-06-18 NOTE — H&P (View-Only) (Signed)
Patient ID: Jodi Bell MRN: [id number] DOB/AGE: 11-Jan-1961 62 y.o.  Primary Care Physician:Furr, Tor Netters., MD Primary Cardiologist: Donato Schultz, MD  FOCUSED CARDIOVASCULAR PROBLEM LIST:   1.  Aortic stenosis with an aortic valve area of 0.86 cm2, peak velocity of 4.1 m/s, and mean gradient 36 mmHg and ejection fraction is 65 to 70% on [facility] echocardiogram May 2023; EKG demonstrates sinus rhythm without bundle-branch blocks 2.  Coronary artery disease status post CABG consisting of a LIMA to LAD and vein graft to PDA 2022 3.  End-stage renal disease on hemodialysis 4.  Chronic hypoxia on chronic supplemental oxygen 5.  Type 2 diabetes mellitus, diet-controlled 6.  Hypertension    HISTORY OF PRESENT ILLNESS: The patient is a 62 y.o. female with the indicated medical history here for recommendations regarding the patient's severe aortic stenosis.  The patient is here with her daughter.  She tells me over the last few months she has become increasingly more short of breath.  She contracted COVID in early 2023 and since that time she seems to have developed an oxygen requirement.  She has been evaluated by pulmonology and despite rather long smoking history her PFTs demonstrate no evidence of COPD but was concerning for interstitial lung disease.  However high-resolution chest CT in 2023 was negative for ILD.  She tells me she has been increasingly short of breath with rather minimal exertion.  Supplemental oxygen seems to help the symptoms but does not relieve them entirely.  She is not short of breath at rest.  Prior to contracting COVID she was quite active and able to do all of her activities of daily living without any issues.  She has been on dialysis for about 5 or 6 years without any limitations.  Notably she has had no issues with shortened dialysis or low blood pressures during dialysis.  She has had no infectious issues recently as well.  She denies chest pain, presyncope, or  syncope.  She does report orthopnea.  She has some mild right lower extremity swelling which is limited and seems to have occurred after her vein harvest for her CABG procedure.  She fortunately has not required any recent emergency room visits or hospitalizations.  She has a few remaining teeth left and is planning to make an appointment to have these extracted so that she can be fitted for full dentures.  Past Medical History:  Diagnosis Date   Anemia of chronic disease    Anxiety    Arthritis    oa and psoriatic ra   Asthma    Back pain, chronic    lower back   Chronic insomnia    COPD (chronic obstructive pulmonary disease) (HCC)    Diabetes mellitus    type 2   ESRD on dialysis [facility])    Essential hypertension    GERD (gastroesophageal reflux disease)    Gout    History of hiatal hernia    Hypoalbuminemia    Hyponatremia    Mild aortic stenosis    Mitral regurgitation    Mitral stenosis    Obesity    PAD (peripheral artery disease) (HCC)    a. externial iliac calcification seen on CT 04/2020.   Sleep apnea    Tobacco abuse    Upper GI bleed 05/2019    Past Surgical History:  Procedure Laterality Date   BACK SURGERY  2016   lower back fusion with cage   BIOPSY  09/23/2017   Procedure: BIOPSY;  Surgeon:  Graylin Shiver, MD;  Location: [facility] ENDOSCOPY;  Service: Endoscopy;;   BIOPSY  05/19/2019   Procedure: BIOPSY;  Surgeon: Kathi Der, MD;  Location: [facility] ENDOSCOPY;  Service: Gastroenterology;;   CESAREAN SECTION  1990   x 1    COLONOSCOPY WITH PROPOFOL N/A 09/23/2017   Procedure: COLONOSCOPY WITH PROPOFOL Hemostatic clips placed;  Surgeon: Graylin Shiver, MD;  Location: [facility] ENDOSCOPY;  Service: Endoscopy;  Laterality: N/A;   COLONOSCOPY WITH PROPOFOL N/A 10/02/2020   Procedure: COLONOSCOPY WITH PROPOFOL;  Surgeon: Willis Modena, MD;  Location: [facility] ENDOSCOPY;  Service: Endoscopy;  Laterality: N/A;   CORONARY ARTERY BYPASS GRAFT N/A 09/13/2020   Procedure: CORONARY ARTERY  BYPASS GRAFTING (CABG), ON PUMP, TIMES TWO, USING LEFT INTERNAL MAMMARY ARTERY AND RIGHT ENDOSCOPICALLY HARVESTED GREATER SAPHENOUS VEIN;  Surgeon: Alleen Borne, MD;  Location: [facility] OR;  Service: Open Heart Surgery;  Laterality: N/A;   DILATION AND CURETTAGE OF UTERUS  1988   ENTEROSCOPY N/A 07/22/2021   Procedure: ENTEROSCOPY;  Surgeon: Kerin Salen, MD;  Location: [facility] ENDOSCOPY;  Service: Gastroenterology;  Laterality: N/A;   ESOPHAGOGASTRODUODENOSCOPY N/A 09/22/2020   Procedure: ESOPHAGOGASTRODUODENOSCOPY (EGD);  Surgeon: Willis Modena, MD;  Location: [facility] ENDOSCOPY;  Service: Endoscopy;  Laterality: N/A;   ESOPHAGOGASTRODUODENOSCOPY (EGD) WITH PROPOFOL N/A 09/23/2017   Procedure: ESOPHAGOGASTRODUODENOSCOPY (EGD) WITH PROPOFOL;  Surgeon: Graylin Shiver, MD;  Location: [facility] ENDOSCOPY;  Service: Endoscopy;  Laterality: N/A;   ESOPHAGOGASTRODUODENOSCOPY (EGD) WITH PROPOFOL N/A 05/19/2019   Procedure: ESOPHAGOGASTRODUODENOSCOPY (EGD) WITH PROPOFOL;  Surgeon: Kathi Der, MD;  Location: [facility] ENDOSCOPY;  Service: Gastroenterology;  Laterality: N/A;   ESOPHAGOGASTRODUODENOSCOPY (EGD) WITH PROPOFOL N/A 10/02/2020   Procedure: ESOPHAGOGASTRODUODENOSCOPY (EGD) WITH PROPOFOL;  Surgeon: Willis Modena, MD;  Location: [facility] ENDOSCOPY;  Service: Endoscopy;  Laterality: N/A;   GIVENS CAPSULE STUDY N/A 05/19/2019   Procedure: GIVENS CAPSULE STUDY;  Surgeon: Kathi Der, MD;  Location: [facility] ENDOSCOPY;  Service: Gastroenterology;  Laterality: N/A;   GIVENS CAPSULE STUDY N/A 07/19/2021   Procedure: GIVENS CAPSULE STUDY;  Surgeon: [name], MD;  Location: [facility] ENDOSCOPY;  Service: Gastroenterology;  Laterality: N/A;   HEMOSTASIS CLIP PLACEMENT  09/22/2020   Procedure: HEMOSTASIS CLIP PLACEMENT;  Surgeon: Willis Modena, MD;  Location: [facility] ENDOSCOPY;  Service: Endoscopy;;   HEMOSTASIS CONTROL  09/22/2020   Procedure: HEMOSTASIS CONTROL;  Surgeon: Willis Modena, MD;  Location: [facility] ENDOSCOPY;  Service: Endoscopy;;   HOT  HEMOSTASIS N/A 05/19/2019   Procedure: HOT HEMOSTASIS (ARGON PLASMA COAGULATION/BICAP);  Surgeon: Kathi Der, MD;  Location: [facility] ENDOSCOPY;  Service: Gastroenterology;  Laterality: N/A;   LEFT HEART CATH AND CORONARY ANGIOGRAPHY N/A 09/10/2020   Procedure: LEFT HEART CATH AND CORONARY ANGIOGRAPHY;  Surgeon: Yvonne Kendall, MD;  Location: [facility] INVASIVE CV LAB;  Service: Cardiovascular;  Laterality: N/A;   PARTIAL KNEE ARTHROPLASTY Left 11/12/2017   Procedure: left unicompartmental arthroplasty-medial;  Surgeon: Durene Romans, MD;  Location: [facility] ORS;  Service: Orthopedics;  Laterality: Left;    SUBMUCOSAL INJECTION  09/23/2017   Procedure: SUBMUCOSAL INJECTION;  Surgeon: Graylin Shiver, MD;  Location: [facility] ENDOSCOPY;  Service: Endoscopy;;  in colon   TEE WITHOUT CARDIOVERSION N/A 09/13/2020   Procedure: TRANSESOPHAGEAL ECHOCARDIOGRAM (TEE);  Surgeon: Alleen Borne, MD;  Location: [facility] OR;  Service: Open Heart Surgery;  Laterality: N/A;   TUBAL LIGATION  1990    Family History  Problem Relation Age of Onset   Lung cancer Mother    Diabetes Sister    Heart disease Sister    Asthma Daughter  Arthritis Daughter    Arthritis Daughter    Thyroid cancer Other        1/2 sister   Heart disease Other        1/2 sister    Social History   Socioeconomic History   Marital status: Divorced    Spouse name: BASIL   Number of children: 3   Years of education: 1   Highest education level: GED or equivalent  Occupational History   Occupation: retired Public house manager  Tobacco Use   Smoking status: Former    Packs/day: 0.50    Years: 40.00    Additional pack years: 0.00    Total pack years: 20.00    Types: Cigarettes    Quit date: 08/2020    Years since quitting: 1.8   Smokeless tobacco: Never  Vaping Use   Vaping Use: Never used  Substance and Sexual Activity   Alcohol use: Yes    Comment: occ   Drug use: No   Sexual activity: Not on file  Other Topics Concern   Not on file  Social History  Narrative   Not on file   Social Determinants of Health   Financial Resource Strain: Low Risk  (09/15/2018)   Overall Financial Resource Strain (CARDIA)    Difficulty of Paying Living Expenses: Not hard at all  Food Insecurity: No Food Insecurity (02/26/2022)   Hunger Vital Sign    Worried About Running Out of Food in the Last Year: Never true    Ran Out of Food in the Last Year: Never true  Transportation Needs: No Transportation Needs (02/26/2022)   PRAPARE - Administrator, Civil Service (Medical): No    Lack of Transportation (Non-Medical): No  Physical Activity: Inactive (09/15/2018)   Exercise Vital Sign    Days of Exercise per Week: 0 days    Minutes of Exercise per Session: 0 min  Stress: No Stress Concern Present (09/15/2018)   [employer] of Occupational Health - Occupational Stress Questionnaire    Feeling of Stress : Not at all  Social Connections: Moderately Isolated (09/15/2018)   Social Connection and Isolation Panel [NHANES]    Frequency of Communication with Friends and Family: More than three times a week    Frequency of Social Gatherings with Friends and Family: Three times a week    Attends Religious Services: More than 4 times per year    Active Member of Clubs or Organizations: No    Attends Banker Meetings: Never    Marital Status: Divorced  Catering manager Violence: Not At Risk (02/26/2022)   Humiliation, Afraid, Rape, and Kick questionnaire    Fear of Current or Ex-Partner: No    Emotionally Abused: No    Physically Abused: No    Sexually Abused: No     Prior to Admission medications   Medication Sig Start Date End Date Taking? Authorizing Provider  acetaminophen (TYLENOL) 500 MG tablet Take 1,000 mg by mouth daily as needed (pain).    [provider]  albuterol (PROVENTIL) (2.5 MG/3ML) 0.083% nebulizer solution Take 3 mLs (2.5 mg total) by nebulization every 6 (six) hours as needed for wheezing or shortness of breath.  07/10/21   Hunsucker, Lesia Sago, MD  allopurinol (ZYLOPRIM) 100 MG tablet Take 100 mg by mouth every evening.    [provider]  carboxymethylcellulose 1 % ophthalmic solution Place 2 drops into both eyes 2 (two) times daily as needed (dry eyes).    [provider]  carvedilol (COREG) 25 MG tablet Take 25 mg by mouth 2 (two) times daily with a meal.    [provider]  cetirizine (ZYRTEC) 10 MG tablet Take 10 mg by mouth daily as needed for allergies.    [provider]  cholecalciferol (VITAMIN D) 25 MCG tablet Take 1 tablet (1,000 Units total) by mouth every evening. Patient taking differently: Take 1,000 Units by mouth See admin instructions. Take one tablet by mouth on Monday Wednesdays and Fridays 10/04/20   Gershon Crane E, PA-C  cholecalciferol (VITAMIN D3) 10 MCG (400 UNIT) TABS tablet Take 800 Units by mouth daily.    [provider]  cinacalcet (SENSIPAR) 60 MG tablet Take 60 mg by mouth every evening.    [provider]  cloNIDine (CATAPRES - DOSED IN MG/24 HR) 0.3 mg/24hr patch 0.3 mg once a week.    [provider]  cyclobenzaprine (FLEXERIL) 10 MG tablet Take 10 mg by mouth 2 (two) times daily as needed for muscle spasms.    [provider]  Darbepoetin Alfa (ARANESP) 200 MCG/0.4ML SOSY injection Inject 0.4 mLs (200 mcg total) into the vein every Wednesday with hemodialysis. 10/10/20   Gershon Crane E, PA-C  diclofenac Sodium (VOLTAREN) 1 % GEL Apply 2 g topically daily as needed (pain).    [provider]  docusate sodium (COLACE) 100 MG capsule Take 100 mg by mouth daily as needed for mild constipation.    [provider]  EPINEPHrine 0.3 mg/0.3 mL IJ SOAJ injection Inject into the muscle as needed for anaphylaxis. 07/12/21   [provider]  esomeprazole (NEXIUM) 20 MG capsule Take by mouth as needed (heartburn). 05/21/19   [provider]  ferrous sulfate 325 (65 FE) MG tablet Take 325  mg by mouth See admin instructions. Take 1 tablet by mouth three times a week on Tues, Thur, Sat only per patient    [provider]  folic acid (FOLVITE) 400 MCG tablet Take 400 mcg by mouth daily.    [provider]  heparin 1000 unit/mL SOLN injection 1 mL (1,000 Units total) by Dialysis route as needed (in dialysis). 10/04/20   Gershon Crane E, PA-C  hydrALAZINE (APRESOLINE) 100 MG tablet Take 1 tablet (100 mg total) by mouth every 8 (eight) hours. Patient taking differently: Take 100 mg by mouth 3 (three) times daily. 07/06/20   Cresenzo, Cyndi Lennert, MD  hydrocortisone (ANUSOL-HC) 2.5 % rectal cream Place rectally 2 (two) times daily. Patient taking differently: Place 1 Application rectally daily as needed for hemorrhoids or anal itching. 07/23/21   Park Pope, MD  isosorbide mononitrate (IMDUR) 60 MG 24 hr tablet Take 1 tablet (60 mg total) by mouth daily. 08/14/21   Bhagat, Bhavinkumar, PA  LORazepam (ATIVAN) 1 MG tablet Take 1 mg by mouth every 8 (eight) hours as needed for anxiety. 06/25/20   [provider]  montelukast (SINGULAIR) 10 MG tablet Take 10 mg by mouth daily with supper.     [provider]  NIFEdipine (PROCARDIA XL/NIFEDICAL XL) 60 MG 24 hr tablet Take 60 mg by mouth 2 (two) times daily. 10/01/21   [provider]  olmesartan (BENICAR) 40 MG tablet Take 40 mg by mouth every evening. 05/06/19   [provider]  ondansetron (ZOFRAN) 4 MG tablet Take 4 mg by mouth 2 (two) times daily as needed for nausea or vomiting.    [provider]  OVER THE COUNTER MEDICATION Take by mouth daily as needed (sickness). Elderberry  Fruit and flower 1 dropper full by mouth daily as needed for sickness    [provider]  Oxycodone HCl 10 MG TABS Take 10 mg by mouth daily as needed for pain. 01/11/21   [provider]  OXYGEN Inhale 2 L into the lungs as directed.    [provider]  pantoprazole (PROTONIX) 40 MG tablet  Take 40 mg by mouth daily. 06/09/20   [provider]  predniSONE (DELTASONE) 5 MG tablet Take 1 tablet (5 mg total) by mouth daily with breakfast. 02/27/22   Marrianne Mood, MD  Probiotic Product (PROBIOTIC PO) Take 1 capsule by mouth daily.    [provider]  sorbitol 70 % SOLN Take 30 ml 1-2 times daily as needed for constipation 07/23/21   Park Pope, MD  Tiotropium Bromide-Olodaterol (STIOLTO RESPIMAT) 2.5-2.5 MCG/ACT AERS Inhale 2 puffs once daily into the lungs. 11/28/21   Hunsucker, Lesia Sago, MD  VENTOLIN HFA 108 (90 Base) MCG/ACT inhaler Inhale 2 puffs into the lungs See admin instructions. Take 2 puffs every 4-6 hours as needed for wheezing and shortness of breath 04/08/22   Hunsucker, Lesia Sago, MD  zolpidem (AMBIEN) 5 MG tablet Take 5 mg by mouth at bedtime as needed for sleep. 02/04/21   [provider]    Allergies  Allergen Reactions   3-Methyl-2-Benzothiazolinone Hydrazone Other (See Comments)    Muscle weakness muscle cramping in legs Other reaction(s): Myalgias (Muscle Pain)   Banana Anaphylaxis, Swelling and Other (See Comments)    TONGUE AND MOUTH SWELLING   Black Walnut Flavor Anaphylaxis and Itching    Walnuts   Corylus Anaphylaxis    Hazelnuts    Hazelnuts Hazelnuts    Hazelnuts, Hazelnuts  Hazelnuts, Hazelnuts   Hazelnut (Filbert) Anaphylaxis    Hazelnuts   Leflunomide And Related Other (See Comments)    Severe Headache   Lisinopril Swelling    Angioedema  Swelling of the face   No Healthtouch Food Allergies Anaphylaxis    Spicy Mustard 4.7.2021 Pt reports that she eats Regular Yellow Mustard Swelling and itching of tongue   Other Other (See Comments), Anaphylaxis and Swelling    Cosentyx= angioedema  Other reaction(s): Facial Edema (intolerance) Mouth itches and tongue swelling Hazel nuts and pecans also Walnuts Walnuts Renal failure Other reaction(s): Other Serotonin Syndrome  Serotonin syndrome  Tremors Other  reaction(s): Other Tremors Muscle weakness muscle cramping in legs Other reaction(s): Myalgias (Muscle Pain) Mouth itches and tongue swelling Hazel nuts and pecans also Walnuts    Pecan Extract Anaphylaxis and Itching    Pecans    Pecan Nut (Diagnostic) Anaphylaxis and Itching    Pecans   Trazodone And Nefazodone Other (See Comments)    Serotonin Syndrome  Tremors   Adalimumab Other (See Comments)    Blisters on abdomen =humira   Escitalopram Oxalate Other (See Comments)    Hand problems - Serotonin Syndrome     Ezetimibe Other (See Comments)    myalgias cramps    Secukinumab Swelling    Cosentyx = angiodema    Statins Other (See Comments)    Leg pains and weakness   Duloxetine Hcl     Other Reaction(s): hives   Ferrlecit [Na Ferric Gluc Cplx In Sucrose]     Not reaction given from HD   Gabapentin Other (See Comments)    tremors   Ibuprofen Other (See Comments)    Renal Problems   Duloxetine Rash    REVIEW OF SYSTEMS:  General: no fevers/chills/night  sweats Eyes: no blurry vision, diplopia, or amaurosis ENT: no sore throat or hearing loss Resp: no cough, wheezing, or hemoptysis CV: no edema or palpitations GI: no abdominal pain, nausea, vomiting, diarrhea, or constipation GU: no dysuria, frequency, or hematuria Skin: no rash Neuro: no headache, numbness, tingling, or weakness of extremities Musculoskeletal: no joint pain or swelling Heme: no bleeding, DVT, or easy bruising Endo: no polydipsia or polyuria  BP 112/66   Pulse 70   Ht 5\' 2"  (1.575 m)   Wt 133 lb 12.8 oz (60.7 kg)   SpO2 90% Comment: 3 Liters of oxygen  BMI 24.47 kg/m   PHYSICAL EXAM: GEN:  AO x 3 in no acute distress HEENT: normal Dentition: Poor Neck: JVP normal. +2 carotid upstrokes without bruits. No thyromegaly. Lungs: equal expansion, clear bilaterally CV: Apex is discrete and nondisplaced, RRR with 3/6 SEM Abd: soft, non-tender, non-distended; no bruit; positive bowel  sounds Ext: no edema, ecchymoses, or cyanosis Vascular: 2+ femoral pulses, 2+ radial pulses       Skin: warm and dry without rash Neuro: CN II-XII grossly intact; motor and sensory grossly intact    DATA AND STUDIES:  EKG:  Sinus rhythm with left ventricular hypertrophy  2D ECHO: [facility] 07/2021  1. Left ventricular ejection fraction, by estimation, is 65 to 70%. The  left ventricle has normal function. The left ventricle has no regional  wall motion abnormalities. There is severe concentric left ventricular  hypertrophy. Left ventricular diastolic   parameters are consistent with Grade II diastolic dysfunction  (pseudonormalization). Elevated left atrial pressure.   2. Right ventricular systolic function is mildly reduced. The right  ventricular size is normal. There is moderately elevated pulmonary artery  systolic pressure. The estimated right ventricular systolic pressure is  55.2 mmHg which is higher than previous   TTE on 06/26/21 at . May be related to volume overload, however, PE  is on the differential. Recommend clinical correlation.   3. Left atrial size was severely dilated.   4. Right atrial size was mildly dilated.   5. The mitral valve is degenerative. Mild mitral valve regurgitation.  Severe mitral annular calcification.   6. Tricuspid valve regurgitation is mild to moderate.   7. The aortic valve is tricuspid. There is moderate calcification of the  aortic valve. There is moderate thickening of the aortic valve. Aortic  valve regurgitation is not visualized. Moderate aortic valve stenosis.  Aortic valve mean gradient measures  36.0 mmHg. Aortic valve Vmax measures 4.11 m/s. AVA by continuity 1.4cm2,  DI 0.67m/s. Suspect elevated mean gradient and Vmax in the setting of high  output with LVOT VTI 40.4cm.   8. Aortic dilatation noted. There is borderline dilatation of the  ascending aorta, measuring 39 mm.   9. The inferior vena cava is dilated in size with  <50% respiratory  variability, suggesting right atrial pressure of 15 mmHg.   CARDIAC CATH: n/a  STS RISK CALCULATOR: pending  NHYA CLASS: 2    ASSESSMENT AND PLAN:   Severe aortic stenosis - Plan: EKG 12-Lead  Type 2 diabetes mellitus with complication, without long-term current use of insulin (HCC)  Hypertension associated with diabetes (HCC)  Hyperlipidemia associated with type 2 diabetes mellitus (HCC)  ESRD (end stage renal disease) (HCC)  S/P CABG x 2  The patient has developed increasing dyspnea which is lifestyle limiting.  She has multiple reasons to be short of breath and so her dyspnea syndrome is likely multifactorial including contributions from COVID infection,  deconditioning, pulmonary hypertension, and aortic valvular disease.  She seems to have tolerated dialysis well for several years without infectious issues.  I spoke at length with the patient regarding intervention on her aortic valve.  Given her comorbidities including oxygen requirement and prior sternotomy she may not be an optimal surgical candidate.  I will refer her for right heart catheterization and coronary angiography study.  I am interested in what her pulmonary vascular resistance is given her mildly hypokinetic right ventricle on her echocardiogram in June.  We will refer her for a TAVR protocol CTA and a cardiothoracic surgical opinion.  I have asked her to go ahead and get her teeth extracted for her dentures in preparation for a possible valve procedure.  She is relatively young and with end-stage renal disease durability of any bioprosthetic valve will likely be somewhat shortened.  For this reason analysis of the CT scan will be important to understand her risk plane in case further interventions are needed in the future.  I have personally reviewed the patients imaging data as summarized above.  I have reviewed the natural history of aortic stenosis with the patient and family members who are  present today. We have discussed the limitations of medical therapy and the poor prognosis associated with symptomatic aortic stenosis. We have also reviewed potential treatment options, including palliative medical therapy, conventional surgical aortic valve replacement, and transcatheter aortic valve replacement. We discussed treatment options in the context of this patient's specific comorbid medical conditions.   All of the patient's questions were answered today. Will make further recommendations based on the results of studies outlined above.   Total time spent with patient today 40 minutes. This includes reviewing records, evaluating the patient and coordinating care.   [name], MD  06/19/2022 10:57 AM    [facility] [address] [place], [place], [place]  [address] Phone: [phone]; Fax: [phone]

## 2022-06-18 NOTE — Progress Notes (Signed)
Patient ID: Jodi Bell MRN: [id number] DOB/AGE: 11-Jan-1961 62 y.o.  Primary Care Physician:Furr, Tor Netters., MD Primary Cardiologist: Donato Schultz, MD  FOCUSED CARDIOVASCULAR PROBLEM LIST:   1.  Aortic stenosis with an aortic valve area of 0.86 cm2, peak velocity of 4.1 m/s, and mean gradient 36 mmHg and ejection fraction is 65 to 70% on [facility] echocardiogram May 2023; EKG demonstrates sinus rhythm without bundle-branch blocks 2.  Coronary artery disease status post CABG consisting of a LIMA to LAD and vein graft to PDA 2022 3.  End-stage renal disease on hemodialysis 4.  Chronic hypoxia on chronic supplemental oxygen 5.  Type 2 diabetes mellitus, diet-controlled 6.  Hypertension    HISTORY OF PRESENT ILLNESS: The patient is a 62 y.o. female with the indicated medical history here for recommendations regarding the patient's severe aortic stenosis.  The patient is here with her daughter.  She tells me over the last few months she has become increasingly more short of breath.  She contracted COVID in early 2023 and since that time she seems to have developed an oxygen requirement.  She has been evaluated by pulmonology and despite rather long smoking history her PFTs demonstrate no evidence of COPD but was concerning for interstitial lung disease.  However high-resolution chest CT in 2023 was negative for ILD.  She tells me she has been increasingly short of breath with rather minimal exertion.  Supplemental oxygen seems to help the symptoms but does not relieve them entirely.  She is not short of breath at rest.  Prior to contracting COVID she was quite active and able to do all of her activities of daily living without any issues.  She has been on dialysis for about 5 or 6 years without any limitations.  Notably she has had no issues with shortened dialysis or low blood pressures during dialysis.  She has had no infectious issues recently as well.  She denies chest pain, presyncope, or  syncope.  She does report orthopnea.  She has some mild right lower extremity swelling which is limited and seems to have occurred after her vein harvest for her CABG procedure.  She fortunately has not required any recent emergency room visits or hospitalizations.  She has a few remaining teeth left and is planning to make an appointment to have these extracted so that she can be fitted for full dentures.  Past Medical History:  Diagnosis Date   Anemia of chronic disease    Anxiety    Arthritis    oa and psoriatic ra   Asthma    Back pain, chronic    lower back   Chronic insomnia    COPD (chronic obstructive pulmonary disease) (HCC)    Diabetes mellitus    type 2   ESRD on dialysis [facility])    Essential hypertension    GERD (gastroesophageal reflux disease)    Gout    History of hiatal hernia    Hypoalbuminemia    Hyponatremia    Mild aortic stenosis    Mitral regurgitation    Mitral stenosis    Obesity    PAD (peripheral artery disease) (HCC)    a. externial iliac calcification seen on CT 04/2020.   Sleep apnea    Tobacco abuse    Upper GI bleed 05/2019    Past Surgical History:  Procedure Laterality Date   BACK SURGERY  2016   lower back fusion with cage   BIOPSY  09/23/2017   Procedure: BIOPSY;  Surgeon:  Graylin Shiver, MD;  Location: [facility] ENDOSCOPY;  Service: Endoscopy;;   BIOPSY  05/19/2019   Procedure: BIOPSY;  Surgeon: Kathi Der, MD;  Location: [facility] ENDOSCOPY;  Service: Gastroenterology;;   CESAREAN SECTION  1990   x 1    COLONOSCOPY WITH PROPOFOL N/A 09/23/2017   Procedure: COLONOSCOPY WITH PROPOFOL Hemostatic clips placed;  Surgeon: Graylin Shiver, MD;  Location: [facility] ENDOSCOPY;  Service: Endoscopy;  Laterality: N/A;   COLONOSCOPY WITH PROPOFOL N/A 10/02/2020   Procedure: COLONOSCOPY WITH PROPOFOL;  Surgeon: Willis Modena, MD;  Location: [facility] ENDOSCOPY;  Service: Endoscopy;  Laterality: N/A;   CORONARY ARTERY BYPASS GRAFT N/A 09/13/2020   Procedure: CORONARY ARTERY  BYPASS GRAFTING (CABG), ON PUMP, TIMES TWO, USING LEFT INTERNAL MAMMARY ARTERY AND RIGHT ENDOSCOPICALLY HARVESTED GREATER SAPHENOUS VEIN;  Surgeon: Alleen Borne, MD;  Location: [facility] OR;  Service: Open Heart Surgery;  Laterality: N/A;   DILATION AND CURETTAGE OF UTERUS  1988   ENTEROSCOPY N/A 07/22/2021   Procedure: ENTEROSCOPY;  Surgeon: Kerin Salen, MD;  Location: [facility] ENDOSCOPY;  Service: Gastroenterology;  Laterality: N/A;   ESOPHAGOGASTRODUODENOSCOPY N/A 09/22/2020   Procedure: ESOPHAGOGASTRODUODENOSCOPY (EGD);  Surgeon: Willis Modena, MD;  Location: [facility] ENDOSCOPY;  Service: Endoscopy;  Laterality: N/A;   ESOPHAGOGASTRODUODENOSCOPY (EGD) WITH PROPOFOL N/A 09/23/2017   Procedure: ESOPHAGOGASTRODUODENOSCOPY (EGD) WITH PROPOFOL;  Surgeon: Graylin Shiver, MD;  Location: [facility] ENDOSCOPY;  Service: Endoscopy;  Laterality: N/A;   ESOPHAGOGASTRODUODENOSCOPY (EGD) WITH PROPOFOL N/A 05/19/2019   Procedure: ESOPHAGOGASTRODUODENOSCOPY (EGD) WITH PROPOFOL;  Surgeon: Kathi Der, MD;  Location: [facility] ENDOSCOPY;  Service: Gastroenterology;  Laterality: N/A;   ESOPHAGOGASTRODUODENOSCOPY (EGD) WITH PROPOFOL N/A 10/02/2020   Procedure: ESOPHAGOGASTRODUODENOSCOPY (EGD) WITH PROPOFOL;  Surgeon: Willis Modena, MD;  Location: [facility] ENDOSCOPY;  Service: Endoscopy;  Laterality: N/A;   GIVENS CAPSULE STUDY N/A 05/19/2019   Procedure: GIVENS CAPSULE STUDY;  Surgeon: Kathi Der, MD;  Location: [facility] ENDOSCOPY;  Service: Gastroenterology;  Laterality: N/A;   GIVENS CAPSULE STUDY N/A 07/19/2021   Procedure: GIVENS CAPSULE STUDY;  Surgeon: [name], MD;  Location: [facility] ENDOSCOPY;  Service: Gastroenterology;  Laterality: N/A;   HEMOSTASIS CLIP PLACEMENT  09/22/2020   Procedure: HEMOSTASIS CLIP PLACEMENT;  Surgeon: Willis Modena, MD;  Location: [facility] ENDOSCOPY;  Service: Endoscopy;;   HEMOSTASIS CONTROL  09/22/2020   Procedure: HEMOSTASIS CONTROL;  Surgeon: Willis Modena, MD;  Location: [facility] ENDOSCOPY;  Service: Endoscopy;;   HOT  HEMOSTASIS N/A 05/19/2019   Procedure: HOT HEMOSTASIS (ARGON PLASMA COAGULATION/BICAP);  Surgeon: Kathi Der, MD;  Location: [facility] ENDOSCOPY;  Service: Gastroenterology;  Laterality: N/A;   LEFT HEART CATH AND CORONARY ANGIOGRAPHY N/A 09/10/2020   Procedure: LEFT HEART CATH AND CORONARY ANGIOGRAPHY;  Surgeon: Yvonne Kendall, MD;  Location: [facility] INVASIVE CV LAB;  Service: Cardiovascular;  Laterality: N/A;   PARTIAL KNEE ARTHROPLASTY Left 11/12/2017   Procedure: left unicompartmental arthroplasty-medial;  Surgeon: Durene Romans, MD;  Location: [facility] ORS;  Service: Orthopedics;  Laterality: Left;    SUBMUCOSAL INJECTION  09/23/2017   Procedure: SUBMUCOSAL INJECTION;  Surgeon: Graylin Shiver, MD;  Location: [facility] ENDOSCOPY;  Service: Endoscopy;;  in colon   TEE WITHOUT CARDIOVERSION N/A 09/13/2020   Procedure: TRANSESOPHAGEAL ECHOCARDIOGRAM (TEE);  Surgeon: Alleen Borne, MD;  Location: [facility] OR;  Service: Open Heart Surgery;  Laterality: N/A;   TUBAL LIGATION  1990    Family History  Problem Relation Age of Onset   Lung cancer Mother    Diabetes Sister    Heart disease Sister    Asthma Daughter  Arthritis Daughter    Arthritis Daughter    Thyroid cancer Other        1/2 sister   Heart disease Other        1/2 sister    Social History   Socioeconomic History   Marital status: Divorced    Spouse name: BASIL   Number of children: 3   Years of education: 1   Highest education level: GED or equivalent  Occupational History   Occupation: retired Public house manager  Tobacco Use   Smoking status: Former    Packs/day: 0.50    Years: 40.00    Additional pack years: 0.00    Total pack years: 20.00    Types: Cigarettes    Quit date: 08/2020    Years since quitting: 1.8   Smokeless tobacco: Never  Vaping Use   Vaping Use: Never used  Substance and Sexual Activity   Alcohol use: Yes    Comment: occ   Drug use: No   Sexual activity: Not on file  Other Topics Concern   Not on file  Social History  Narrative   Not on file   Social Determinants of Health   Financial Resource Strain: Low Risk  (09/15/2018)   Overall Financial Resource Strain (CARDIA)    Difficulty of Paying Living Expenses: Not hard at all  Food Insecurity: No Food Insecurity (02/26/2022)   Hunger Vital Sign    Worried About Running Out of Food in the Last Year: Never true    Ran Out of Food in the Last Year: Never true  Transportation Needs: No Transportation Needs (02/26/2022)   PRAPARE - Administrator, Civil Service (Medical): No    Lack of Transportation (Non-Medical): No  Physical Activity: Inactive (09/15/2018)   Exercise Vital Sign    Days of Exercise per Week: 0 days    Minutes of Exercise per Session: 0 min  Stress: No Stress Concern Present (09/15/2018)   [employer] of Occupational Health - Occupational Stress Questionnaire    Feeling of Stress : Not at all  Social Connections: Moderately Isolated (09/15/2018)   Social Connection and Isolation Panel [NHANES]    Frequency of Communication with Friends and Family: More than three times a week    Frequency of Social Gatherings with Friends and Family: Three times a week    Attends Religious Services: More than 4 times per year    Active Member of Clubs or Organizations: No    Attends Banker Meetings: Never    Marital Status: Divorced  Catering manager Violence: Not At Risk (02/26/2022)   Humiliation, Afraid, Rape, and Kick questionnaire    Fear of Current or Ex-Partner: No    Emotionally Abused: No    Physically Abused: No    Sexually Abused: No     Prior to Admission medications   Medication Sig Start Date End Date Taking? Authorizing Provider  acetaminophen (TYLENOL) 500 MG tablet Take 1,000 mg by mouth daily as needed (pain).    [provider]  albuterol (PROVENTIL) (2.5 MG/3ML) 0.083% nebulizer solution Take 3 mLs (2.5 mg total) by nebulization every 6 (six) hours as needed for wheezing or shortness of breath.  07/10/21   Hunsucker, Lesia Sago, MD  allopurinol (ZYLOPRIM) 100 MG tablet Take 100 mg by mouth every evening.    [provider]  carboxymethylcellulose 1 % ophthalmic solution Place 2 drops into both eyes 2 (two) times daily as needed (dry eyes).    [provider]  carvedilol (COREG) 25 MG tablet Take 25 mg by mouth 2 (two) times daily with a meal.    [provider]  cetirizine (ZYRTEC) 10 MG tablet Take 10 mg by mouth daily as needed for allergies.    [provider]  cholecalciferol (VITAMIN D) 25 MCG tablet Take 1 tablet (1,000 Units total) by mouth every evening. Patient taking differently: Take 1,000 Units by mouth See admin instructions. Take one tablet by mouth on Monday Wednesdays and Fridays 10/04/20   Gershon Crane E, PA-C  cholecalciferol (VITAMIN D3) 10 MCG (400 UNIT) TABS tablet Take 800 Units by mouth daily.    [provider]  cinacalcet (SENSIPAR) 60 MG tablet Take 60 mg by mouth every evening.    [provider]  cloNIDine (CATAPRES - DOSED IN MG/24 HR) 0.3 mg/24hr patch 0.3 mg once a week.    [provider]  cyclobenzaprine (FLEXERIL) 10 MG tablet Take 10 mg by mouth 2 (two) times daily as needed for muscle spasms.    [provider]  Darbepoetin Alfa (ARANESP) 200 MCG/0.4ML SOSY injection Inject 0.4 mLs (200 mcg total) into the vein every Wednesday with hemodialysis. 10/10/20   Gershon Crane E, PA-C  diclofenac Sodium (VOLTAREN) 1 % GEL Apply 2 g topically daily as needed (pain).    [provider]  docusate sodium (COLACE) 100 MG capsule Take 100 mg by mouth daily as needed for mild constipation.    [provider]  EPINEPHrine 0.3 mg/0.3 mL IJ SOAJ injection Inject into the muscle as needed for anaphylaxis. 07/12/21   [provider]  esomeprazole (NEXIUM) 20 MG capsule Take by mouth as needed (heartburn). 05/21/19   [provider]  ferrous sulfate 325 (65 FE) MG tablet Take 325  mg by mouth See admin instructions. Take 1 tablet by mouth three times a week on Tues, Thur, Sat only per patient    [provider]  folic acid (FOLVITE) 400 MCG tablet Take 400 mcg by mouth daily.    [provider]  heparin 1000 unit/mL SOLN injection 1 mL (1,000 Units total) by Dialysis route as needed (in dialysis). 10/04/20   Gershon Crane E, PA-C  hydrALAZINE (APRESOLINE) 100 MG tablet Take 1 tablet (100 mg total) by mouth every 8 (eight) hours. Patient taking differently: Take 100 mg by mouth 3 (three) times daily. 07/06/20   Cresenzo, Cyndi Lennert, MD  hydrocortisone (ANUSOL-HC) 2.5 % rectal cream Place rectally 2 (two) times daily. Patient taking differently: Place 1 Application rectally daily as needed for hemorrhoids or anal itching. 07/23/21   Park Pope, MD  isosorbide mononitrate (IMDUR) 60 MG 24 hr tablet Take 1 tablet (60 mg total) by mouth daily. 08/14/21   Bhagat, Bhavinkumar, PA  LORazepam (ATIVAN) 1 MG tablet Take 1 mg by mouth every 8 (eight) hours as needed for anxiety. 06/25/20   [provider]  montelukast (SINGULAIR) 10 MG tablet Take 10 mg by mouth daily with supper.     [provider]  NIFEdipine (PROCARDIA XL/NIFEDICAL XL) 60 MG 24 hr tablet Take 60 mg by mouth 2 (two) times daily. 10/01/21   [provider]  olmesartan (BENICAR) 40 MG tablet Take 40 mg by mouth every evening. 05/06/19   [provider]  ondansetron (ZOFRAN) 4 MG tablet Take 4 mg by mouth 2 (two) times daily as needed for nausea or vomiting.    [provider]  OVER THE COUNTER MEDICATION Take by mouth daily as needed (sickness). Elderberry  Fruit and flower 1 dropper full by mouth daily as needed for sickness    [provider]  Oxycodone HCl 10 MG TABS Take 10 mg by mouth daily as needed for pain. 01/11/21   [provider]  OXYGEN Inhale 2 L into the lungs as directed.    [provider]  pantoprazole (PROTONIX) 40 MG tablet  Take 40 mg by mouth daily. 06/09/20   [provider]  predniSONE (DELTASONE) 5 MG tablet Take 1 tablet (5 mg total) by mouth daily with breakfast. 02/27/22   Marrianne Mood, MD  Probiotic Product (PROBIOTIC PO) Take 1 capsule by mouth daily.    [provider]  sorbitol 70 % SOLN Take 30 ml 1-2 times daily as needed for constipation 07/23/21   Park Pope, MD  Tiotropium Bromide-Olodaterol (STIOLTO RESPIMAT) 2.5-2.5 MCG/ACT AERS Inhale 2 puffs once daily into the lungs. 11/28/21   Hunsucker, Lesia Sago, MD  VENTOLIN HFA 108 (90 Base) MCG/ACT inhaler Inhale 2 puffs into the lungs See admin instructions. Take 2 puffs every 4-6 hours as needed for wheezing and shortness of breath 04/08/22   Hunsucker, Lesia Sago, MD  zolpidem (AMBIEN) 5 MG tablet Take 5 mg by mouth at bedtime as needed for sleep. 02/04/21   [provider]    Allergies  Allergen Reactions   3-Methyl-2-Benzothiazolinone Hydrazone Other (See Comments)    Muscle weakness muscle cramping in legs Other reaction(s): Myalgias (Muscle Pain)   Banana Anaphylaxis, Swelling and Other (See Comments)    TONGUE AND MOUTH SWELLING   Black Walnut Flavor Anaphylaxis and Itching    Walnuts   Corylus Anaphylaxis    Hazelnuts    Hazelnuts Hazelnuts    Hazelnuts, Hazelnuts  Hazelnuts, Hazelnuts   Hazelnut (Filbert) Anaphylaxis    Hazelnuts   Leflunomide And Related Other (See Comments)    Severe Headache   Lisinopril Swelling    Angioedema  Swelling of the face   No Healthtouch Food Allergies Anaphylaxis    Spicy Mustard 4.7.2021 Pt reports that she eats Regular Yellow Mustard Swelling and itching of tongue   Other Other (See Comments), Anaphylaxis and Swelling    Cosentyx= angioedema  Other reaction(s): Facial Edema (intolerance) Mouth itches and tongue swelling Hazel nuts and pecans also Walnuts Walnuts Renal failure Other reaction(s): Other Serotonin Syndrome  Serotonin syndrome  Tremors Other  reaction(s): Other Tremors Muscle weakness muscle cramping in legs Other reaction(s): Myalgias (Muscle Pain) Mouth itches and tongue swelling Hazel nuts and pecans also Walnuts    Pecan Extract Anaphylaxis and Itching    Pecans    Pecan Nut (Diagnostic) Anaphylaxis and Itching    Pecans   Trazodone And Nefazodone Other (See Comments)    Serotonin Syndrome  Tremors   Adalimumab Other (See Comments)    Blisters on abdomen =humira   Escitalopram Oxalate Other (See Comments)    Hand problems - Serotonin Syndrome     Ezetimibe Other (See Comments)    myalgias cramps    Secukinumab Swelling    Cosentyx = angiodema    Statins Other (See Comments)    Leg pains and weakness   Duloxetine Hcl     Other Reaction(s): hives   Ferrlecit [Na Ferric Gluc Cplx In Sucrose]     Not reaction given from HD   Gabapentin Other (See Comments)    tremors   Ibuprofen Other (See Comments)    Renal Problems   Duloxetine Rash    REVIEW OF SYSTEMS:  General: no fevers/chills/night  sweats Eyes: no blurry vision, diplopia, or amaurosis ENT: no sore throat or hearing loss Resp: no cough, wheezing, or hemoptysis CV: no edema or palpitations GI: no abdominal pain, nausea, vomiting, diarrhea, or constipation GU: no dysuria, frequency, or hematuria Skin: no rash Neuro: no headache, numbness, tingling, or weakness of extremities Musculoskeletal: no joint pain or swelling Heme: no bleeding, DVT, or easy bruising Endo: no polydipsia or polyuria  BP 112/66   Pulse 70   Ht 5\' 2"  (1.575 m)   Wt 133 lb 12.8 oz (60.7 kg)   SpO2 90% Comment: 3 Liters of oxygen  BMI 24.47 kg/m   PHYSICAL EXAM: GEN:  AO x 3 in no acute distress HEENT: normal Dentition: Poor Neck: JVP normal. +2 carotid upstrokes without bruits. No thyromegaly. Lungs: equal expansion, clear bilaterally CV: Apex is discrete and nondisplaced, RRR with 3/6 SEM Abd: soft, non-tender, non-distended; no bruit; positive bowel  sounds Ext: no edema, ecchymoses, or cyanosis Vascular: 2+ femoral pulses, 2+ radial pulses       Skin: warm and dry without rash Neuro: CN II-XII grossly intact; motor and sensory grossly intact    DATA AND STUDIES:  EKG:  Sinus rhythm with left ventricular hypertrophy  2D ECHO: [facility] 07/2021  1. Left ventricular ejection fraction, by estimation, is 65 to 70%. The  left ventricle has normal function. The left ventricle has no regional  wall motion abnormalities. There is severe concentric left ventricular  hypertrophy. Left ventricular diastolic   parameters are consistent with Grade II diastolic dysfunction  (pseudonormalization). Elevated left atrial pressure.   2. Right ventricular systolic function is mildly reduced. The right  ventricular size is normal. There is moderately elevated pulmonary artery  systolic pressure. The estimated right ventricular systolic pressure is  55.2 mmHg which is higher than previous   TTE on 06/26/21 at . May be related to volume overload, however, PE  is on the differential. Recommend clinical correlation.   3. Left atrial size was severely dilated.   4. Right atrial size was mildly dilated.   5. The mitral valve is degenerative. Mild mitral valve regurgitation.  Severe mitral annular calcification.   6. Tricuspid valve regurgitation is mild to moderate.   7. The aortic valve is tricuspid. There is moderate calcification of the  aortic valve. There is moderate thickening of the aortic valve. Aortic  valve regurgitation is not visualized. Moderate aortic valve stenosis.  Aortic valve mean gradient measures  36.0 mmHg. Aortic valve Vmax measures 4.11 m/s. AVA by continuity 1.4cm2,  DI 0.67m/s. Suspect elevated mean gradient and Vmax in the setting of high  output with LVOT VTI 40.4cm.   8. Aortic dilatation noted. There is borderline dilatation of the  ascending aorta, measuring 39 mm.   9. The inferior vena cava is dilated in size with  <50% respiratory  variability, suggesting right atrial pressure of 15 mmHg.   CARDIAC CATH: n/a  STS RISK CALCULATOR: pending  NHYA CLASS: 2    ASSESSMENT AND PLAN:   Severe aortic stenosis - Plan: EKG 12-Lead  Type 2 diabetes mellitus with complication, without long-term current use of insulin (HCC)  Hypertension associated with diabetes (HCC)  Hyperlipidemia associated with type 2 diabetes mellitus (HCC)  ESRD (end stage renal disease) (HCC)  S/P CABG x 2  The patient has developed increasing dyspnea which is lifestyle limiting.  She has multiple reasons to be short of breath and so her dyspnea syndrome is likely multifactorial including contributions from COVID infection,  deconditioning, pulmonary hypertension, and aortic valvular disease.  She seems to have tolerated dialysis well for several years without infectious issues.  I spoke at length with the patient regarding intervention on her aortic valve.  Given her comorbidities including oxygen requirement and prior sternotomy she may not be an optimal surgical candidate.  I will refer her for right heart catheterization and coronary angiography study.  I am interested in what her pulmonary vascular resistance is given her mildly hypokinetic right ventricle on her echocardiogram in June.  We will refer her for a TAVR protocol CTA and a cardiothoracic surgical opinion.  I have asked her to go ahead and get her teeth extracted for her dentures in preparation for a possible valve procedure.  She is relatively young and with end-stage renal disease durability of any bioprosthetic valve will likely be somewhat shortened.  For this reason analysis of the CT scan will be important to understand her risk plane in case further interventions are needed in the future.  I have personally reviewed the patients imaging data as summarized above.  I have reviewed the natural history of aortic stenosis with the patient and family members who are  present today. We have discussed the limitations of medical therapy and the poor prognosis associated with symptomatic aortic stenosis. We have also reviewed potential treatment options, including palliative medical therapy, conventional surgical aortic valve replacement, and transcatheter aortic valve replacement. We discussed treatment options in the context of this patient's specific comorbid medical conditions.   All of the patient's questions were answered today. Will make further recommendations based on the results of studies outlined above.   Total time spent with patient today 40 minutes. This includes reviewing records, evaluating the patient and coordinating care.   [name], MD  06/19/2022 10:57 AM    [facility] [address] [place], [place], [place]  [address] Phone: [phone]; Fax: [phone]

## 2022-06-19 ENCOUNTER — Encounter: Payer: Self-pay | Admitting: Internal Medicine

## 2022-06-19 ENCOUNTER — Ambulatory Visit: Payer: Medicare PPO | Attending: Internal Medicine | Admitting: Internal Medicine

## 2022-06-19 VITALS — BP 112/66 | HR 70 | Ht 62.0 in | Wt 133.8 lb

## 2022-06-19 DIAGNOSIS — E1169 Type 2 diabetes mellitus with other specified complication: Secondary | ICD-10-CM

## 2022-06-19 DIAGNOSIS — N186 End stage renal disease: Secondary | ICD-10-CM

## 2022-06-19 DIAGNOSIS — I350 Nonrheumatic aortic (valve) stenosis: Secondary | ICD-10-CM

## 2022-06-19 DIAGNOSIS — E1159 Type 2 diabetes mellitus with other circulatory complications: Secondary | ICD-10-CM

## 2022-06-19 DIAGNOSIS — I152 Hypertension secondary to endocrine disorders: Secondary | ICD-10-CM

## 2022-06-19 DIAGNOSIS — E118 Type 2 diabetes mellitus with unspecified complications: Secondary | ICD-10-CM | POA: Diagnosis not present

## 2022-06-19 DIAGNOSIS — Z7985 Long-term (current) use of injectable non-insulin antidiabetic drugs: Secondary | ICD-10-CM

## 2022-06-19 DIAGNOSIS — E785 Hyperlipidemia, unspecified: Secondary | ICD-10-CM

## 2022-06-19 DIAGNOSIS — Z951 Presence of aortocoronary bypass graft: Secondary | ICD-10-CM

## 2022-06-19 LAB — ECHOCARDIOGRAM COMPLETE
AR max vel: 1.44 cm2
AV Area VTI: 1.33 cm2
AV Area mean vel: 1.55 cm2
AV Mean grad: 36 mmHg
AV Peak grad: 67.6 mmHg
Ao pk vel: 4.11 m/s
Area-P 1/2: 4.41 cm2
Height: 63 in
MV M vel: 4.22 m/s
MV Peak grad: 71.2 mmHg
S' Lateral: 2.8 cm
Weight: 2310.42 oz

## 2022-06-19 NOTE — Patient Instructions (Addendum)
Medication Instructions:  No changes *If you need a refill on your cardiac medications before your next appointment, please call your pharmacy*   Lab Work: None today   Testing/Procedures: Your physician has requested that you have a cardiac catheterization. Cardiac catheterization is used to diagnose and/or treat various heart conditions. Doctors may recommend this procedure for a number of different reasons. The most common reason is to evaluate chest pain. Chest pain can be a symptom of coronary artery disease (CAD), and cardiac catheterization can show whether plaque is narrowing or blocking your heart's arteries. This procedure is also used to evaluate the valves, as well as measure the blood flow and oxygen levels in different parts of your heart. For further information please visit [email or web address] Please follow instruction sheet, as given.   Follow-Up: Per Structural Heart Team        Cardiac/Peripheral Catheterization   You are scheduled for a Cardiac Catheterization on Thursday, May 16 with Dr. Alverda Skeans.  1. Please arrive at the [facility] (Main Entrance A) at [facility]: [address] [place], [place] [address] at 5:30 AM (This time is TWO hour(s) before your procedure to ensure your preparation). Free valet parking service is available. You will check in at ADMITTING. The support person will be asked to wait in the waiting room.  It is OK to have someone drop you off and come back when you are ready to be discharged.        Special note: Every effort is made to have your procedure done on time. Please understand that emergencies sometimes delay scheduled procedures.  2. Diet: Do not eat solid foods after midnight.  You may have clear liquids until 5 AM the day of the procedure.  3. Labs: up to date  4. Medication instructions in preparation for your procedure:  Contrast Allergy: No  On the morning of your procedure, take Aspirin 81 mg and any  morning medicines NOT listed above.  You may use sips of water.  5. Plan to go home the same day, you will only stay overnight if medically necessary. 6. You MUST have a responsible adult to drive you home. 7. An adult MUST be with you the first 24 hours after you arrive home. 8. Bring a current list of your medications, and the last time and date medication taken. 9. Bring ID and current insurance cards. 10.Please wear clothes that are easy to get on and off and wear slip-on shoes.  Thank you for allowing [place] to care for you!   -- [facility]

## 2022-06-19 NOTE — Progress Notes (Addendum)
Pre Surgical Assessment: 5 M Walk Test  49M=16.46ft  5 Meter Walk Test- trial 1: 10.63 seconds 5 Meter Walk Test- trial 2: 9.02 seconds 5 Meter Walk Test- trial 3: 7.60 seconds 5 Meter Walk Test Average: 9.08 seconds  ___________________________   Procedure Type: Isolated AVR PERIOPERATIVE OUTCOME ESTIMATE % Operative Mortality 16.7% Morbidity & Mortality 28.5% Stroke 3.2% Renal Failure NA Reoperation 5.78% Prolonged Ventilation 25.8% Deep Sternal Wound Infection 0.212% Long Hospital Stay (>14 days) 15.4% Short Hospital Stay (<6 days)* 14.5%

## 2022-06-25 ENCOUNTER — Other Ambulatory Visit: Payer: Self-pay

## 2022-06-25 ENCOUNTER — Telehealth: Payer: Self-pay

## 2022-06-25 DIAGNOSIS — I350 Nonrheumatic aortic (valve) stenosis: Secondary | ICD-10-CM

## 2022-06-25 NOTE — Telephone Encounter (Signed)
Per Dr Delanna Ahmadi to proceed with cath 06/26/22, hold aspirin, CBC on arrival to Short Stay 06/26/22. Call placed to patient and she is aware of Dr Trula Ore recommendations.  Cardiac Catheterization scheduled at [facility] for: Thursday Jun 26, 2022 7:30 AM Arrival time [facility] Main Entrance A at: 5:30 AM  Nothing to eat after midnight prior to procedure, clear liquids until 5 AM day of procedure.  Medication instructions: -Usual morning medications can be taken with sips of water.  Per Dr Loetta Rough should hold aspirin.  Confirmed patient has responsible adult to drive home post procedure and be with patient first 24 hours after arriving home.  Plan to go home the same day, you will only stay overnight if medically necessary.  Reviewed procedure instructions with patient.

## 2022-06-25 NOTE — Telephone Encounter (Signed)
The patient called into the office to make Dr Lynnette Caffey aware that she is passing blood in her stool.  The pt noticed maroon colored stools that started Monday evening. She has a history of GI bleeding and iron deficiency anemia and states that she has bleeding when she is under stress.  The pt is currently at [facility] and a Hgb was drawn and results are pending.  At this time the patient will hold off on taking ASA. She would like to know if she can proceed with cardiac cath as scheduled tomorrow at 7:30 AM with Dr Lynnette Caffey.  I made her aware that I will make Dr Lynnette Caffey aware of this information and determine next steps.    If the patient can proceed with cardiac cath: Should a CBC be drawn on arrival? Should the pt take ASA the morning of procedure?

## 2022-06-26 ENCOUNTER — Encounter: Payer: Self-pay | Admitting: Physician Assistant

## 2022-06-26 ENCOUNTER — Other Ambulatory Visit: Payer: Self-pay | Admitting: Physician Assistant

## 2022-06-26 ENCOUNTER — Ambulatory Visit (HOSPITAL_COMMUNITY)
Admission: RE | Admit: 2022-06-26 | Discharge: 2022-06-26 | Disposition: A | Discharge status: Home / Self Care | Payer: Medicare PPO | Attending: Internal Medicine | Admitting: Internal Medicine

## 2022-06-26 ENCOUNTER — Other Ambulatory Visit: Payer: Self-pay | Admitting: *Deleted

## 2022-06-26 ENCOUNTER — Encounter (HOSPITAL_COMMUNITY)
Admission: RE | Disposition: A | Discharge status: Home / Self Care | Payer: Self-pay | Source: Home / Self Care | Attending: Internal Medicine

## 2022-06-26 DIAGNOSIS — I2582 Chronic total occlusion of coronary artery: Secondary | ICD-10-CM | POA: Insufficient documentation

## 2022-06-26 DIAGNOSIS — I2510 Atherosclerotic heart disease of native coronary artery without angina pectoris: Secondary | ICD-10-CM | POA: Diagnosis not present

## 2022-06-26 DIAGNOSIS — N186 End stage renal disease: Secondary | ICD-10-CM | POA: Diagnosis not present

## 2022-06-26 DIAGNOSIS — I350 Nonrheumatic aortic (valve) stenosis: Secondary | ICD-10-CM | POA: Insufficient documentation

## 2022-06-26 DIAGNOSIS — I120 Hypertensive chronic kidney disease with stage 5 chronic kidney disease or end stage renal disease: Secondary | ICD-10-CM | POA: Diagnosis not present

## 2022-06-26 DIAGNOSIS — E785 Hyperlipidemia, unspecified: Secondary | ICD-10-CM | POA: Insufficient documentation

## 2022-06-26 DIAGNOSIS — Z951 Presence of aortocoronary bypass graft: Secondary | ICD-10-CM | POA: Insufficient documentation

## 2022-06-26 DIAGNOSIS — R0902 Hypoxemia: Secondary | ICD-10-CM | POA: Insufficient documentation

## 2022-06-26 DIAGNOSIS — Z9981 Dependence on supplemental oxygen: Secondary | ICD-10-CM | POA: Insufficient documentation

## 2022-06-26 DIAGNOSIS — Z87891 Personal history of nicotine dependence: Secondary | ICD-10-CM | POA: Diagnosis not present

## 2022-06-26 DIAGNOSIS — D649 Anemia, unspecified: Secondary | ICD-10-CM

## 2022-06-26 DIAGNOSIS — E1122 Type 2 diabetes mellitus with diabetic chronic kidney disease: Secondary | ICD-10-CM | POA: Diagnosis not present

## 2022-06-26 DIAGNOSIS — Z992 Dependence on renal dialysis: Secondary | ICD-10-CM | POA: Insufficient documentation

## 2022-06-26 DIAGNOSIS — K921 Melena: Secondary | ICD-10-CM

## 2022-06-26 DIAGNOSIS — E1169 Type 2 diabetes mellitus with other specified complication: Secondary | ICD-10-CM | POA: Insufficient documentation

## 2022-06-26 DIAGNOSIS — Z01812 Encounter for preprocedural laboratory examination: Secondary | ICD-10-CM

## 2022-06-26 HISTORY — PX: RIGHT HEART CATH AND CORONARY/GRAFT ANGIOGRAPHY: CATH118265

## 2022-06-26 LAB — POCT I-STAT EG7
Acid-base deficit: 1 mmol/L (ref 0.0–2.0)
Acid-base deficit: 1 mmol/L (ref 0.0–2.0)
Bicarbonate: 27.5 mmol/L (ref 20.0–28.0)
Bicarbonate: 27.9 mmol/L (ref 20.0–28.0)
Calcium, Ion: 1.25 mmol/L (ref 1.15–1.40)
Calcium, Ion: 1.32 mmol/L (ref 1.15–1.40)
HCT: 25 % — ABNORMAL LOW (ref 36.0–46.0)
HCT: 26 % — ABNORMAL LOW (ref 36.0–46.0)
Hemoglobin: 8.5 g/dL — ABNORMAL LOW (ref 12.0–15.0)
Hemoglobin: 8.8 g/dL — ABNORMAL LOW (ref 12.0–15.0)
O2 Saturation: 72 %
O2 Saturation: 69 %
Potassium: 4.2 mmol/L (ref 3.5–5.1)
Potassium: 4.3 mmol/L (ref 3.5–5.1)
Sodium: 137 mmol/L (ref 135–145)
Sodium: 136 mmol/L (ref 135–145)
TCO2: 29 mmol/L (ref 22–32)
TCO2: 30 mmol/L (ref 22–32)
pCO2, Ven: 64.9 mmHg — ABNORMAL HIGH (ref 44–60)
pCO2, Ven: 68.8 mmHg — ABNORMAL HIGH (ref 44–60)
pH, Ven: 7.234 — ABNORMAL LOW (ref 7.25–7.43)
pH, Ven: 7.216 — ABNORMAL LOW (ref 7.25–7.43)
pO2, Ven: 46 mmHg — ABNORMAL HIGH (ref 32–45)
pO2, Ven: 44 mmHg (ref 32–45)

## 2022-06-26 LAB — POCT I-STAT 7, (LYTES, BLD GAS, ICA,H+H)
Acid-base deficit: 2 mmol/L (ref 0.0–2.0)
Bicarbonate: 26.2 mmol/L (ref 20.0–28.0)
Calcium, Ion: 1.24 mmol/L (ref 1.15–1.40)
HCT: 25 % — ABNORMAL LOW (ref 36.0–46.0)
Hemoglobin: 8.5 g/dL — ABNORMAL LOW (ref 12.0–15.0)
O2 Saturation: 89 %
Potassium: 4.2 mmol/L (ref 3.5–5.1)
Sodium: 137 mmol/L (ref 135–145)
TCO2: 28 mmol/L (ref 22–32)
pCO2 arterial: 61.1 mmHg — ABNORMAL HIGH (ref 32–48)
pH, Arterial: 7.24 — ABNORMAL LOW (ref 7.35–7.45)
pO2, Arterial: 68 mmHg — ABNORMAL LOW (ref 83–108)

## 2022-06-26 LAB — POCT I-STAT, CHEM 8
BUN: 41 mg/dL — ABNORMAL HIGH (ref 8–23)
Calcium, Ion: 1.22 mmol/L (ref 1.15–1.40)
Chloride: 102 mmol/L (ref 98–111)
Creatinine, Ser: 5.4 mg/dL — ABNORMAL HIGH (ref 0.44–1.00)
Glucose, Bld: 84 mg/dL (ref 70–99)
HCT: 26 % — ABNORMAL LOW (ref 36.0–46.0)
Hemoglobin: 8.8 g/dL — ABNORMAL LOW (ref 12.0–15.0)
Potassium: 4.3 mmol/L (ref 3.5–5.1)
Sodium: 136 mmol/L (ref 135–145)
TCO2: 29 mmol/L (ref 22–32)

## 2022-06-26 LAB — CBC
HCT: 26.8 % — ABNORMAL LOW (ref 36.0–46.0)
Hemoglobin: 7.9 g/dL — ABNORMAL LOW (ref 12.0–15.0)
MCH: 31 pg (ref 26.0–34.0)
MCHC: 29.5 g/dL — ABNORMAL LOW (ref 30.0–36.0)
MCV: 105.1 fL — ABNORMAL HIGH (ref 80.0–100.0)
Platelets: 152 10*3/uL (ref 150–400)
RBC: 2.55 MIL/uL — ABNORMAL LOW (ref 3.87–5.11)
RDW: 14.6 % (ref 11.5–15.5)
WBC: 4.5 10*3/uL (ref 4.0–10.5)
nRBC: 0 % (ref 0.0–0.2)

## 2022-06-26 LAB — GLUCOSE, CAPILLARY
Glucose-Capillary: 85 mg/dL (ref 70–99)
Glucose-Capillary: 85 mg/dL (ref 70–99)

## 2022-06-26 SURGERY — RIGHT HEART CATH AND CORONARY/GRAFT ANGIOGRAPHY
Anesthesia: LOCAL

## 2022-06-26 MED ORDER — HYDRALAZINE HCL 20 MG/ML IJ SOLN: 10.0000 mg | INTRAMUSCULAR | Status: AC | PRN

## 2022-06-26 MED ORDER — IOHEXOL 350 MG/ML SOLN
INTRAVENOUS | Status: DC | PRN
  Administered 2022-06-26: 85 mL

## 2022-06-26 MED ORDER — VERAPAMIL HCL 2.5 MG/ML IV SOLN
INTRAVENOUS | Status: AC
  Filled 2022-06-26: qty 2

## 2022-06-26 MED ORDER — LIDOCAINE HCL (PF) 1 % IJ SOLN
INTRAMUSCULAR | Status: DC | PRN
  Administered 2022-06-26: 10 mL

## 2022-06-26 MED ORDER — ACETAMINOPHEN 325 MG PO TABS: 650.0000 mg | ORAL_TABLET | ORAL | Status: DC | PRN

## 2022-06-26 MED ORDER — ACETAMINOPHEN 325 MG PO TABS
650.0000 mg | ORAL_TABLET | ORAL | Status: DC | PRN
  Administered 2022-06-26: 650 mg via ORAL
  Filled 2022-06-26: qty 2

## 2022-06-26 MED ORDER — MORPHINE SULFATE (PF) 2 MG/ML IV SOLN
1.0000 mg | INTRAVENOUS | Status: DC | PRN
  Administered 2022-06-26: 1 mg via INTRAVENOUS

## 2022-06-26 MED ORDER — SODIUM CHLORIDE 0.9% FLUSH: 3.0000 mL | Freq: Two times a day (BID) | INTRAVENOUS | Status: DC

## 2022-06-26 MED ORDER — LABETALOL HCL 5 MG/ML IV SOLN: 10.0000 mg | INTRAVENOUS | Status: AC | PRN

## 2022-06-26 MED ORDER — HYDRALAZINE HCL 20 MG/ML IJ SOLN
INTRAMUSCULAR | Status: AC
  Filled 2022-06-26: qty 1

## 2022-06-26 MED ORDER — HEPARIN SODIUM (PORCINE) 1000 UNIT/ML IJ SOLN
INTRAMUSCULAR | Status: AC
  Filled 2022-06-26: qty 10

## 2022-06-26 MED ORDER — ONDANSETRON HCL 4 MG/2ML IJ SOLN: 4.0000 mg | Freq: Four times a day (QID) | INTRAMUSCULAR | Status: DC | PRN

## 2022-06-26 MED ORDER — SODIUM CHLORIDE 0.9 % IV SOLN: INTRAVENOUS | Status: DC

## 2022-06-26 MED ORDER — FENTANYL CITRATE (PF) 100 MCG/2ML IJ SOLN
INTRAMUSCULAR | Status: AC
  Filled 2022-06-26: qty 2

## 2022-06-26 MED ORDER — MORPHINE SULFATE (PF) 2 MG/ML IV SOLN
INTRAVENOUS | Status: AC
  Filled 2022-06-26: qty 1

## 2022-06-26 MED ORDER — FENTANYL CITRATE (PF) 100 MCG/2ML IJ SOLN
INTRAMUSCULAR | Status: DC | PRN
  Administered 2022-06-26 (×2): 25 ug via INTRAVENOUS

## 2022-06-26 MED ORDER — LIDOCAINE HCL (PF) 1 % IJ SOLN
INTRAMUSCULAR | Status: AC
  Filled 2022-06-26: qty 30

## 2022-06-26 MED ORDER — OXYCODONE HCL 5 MG PO TABS
10.0000 mg | ORAL_TABLET | Freq: Two times a day (BID) | ORAL | Status: DC | PRN
  Administered 2022-06-26: 10 mg via ORAL

## 2022-06-26 MED ORDER — HYDRALAZINE HCL 20 MG/ML IJ SOLN
10.0000 mg | INTRAMUSCULAR | Status: AC | PRN
  Administered 2022-06-26 (×2): 10 mg via INTRAVENOUS

## 2022-06-26 MED ORDER — OXYCODONE HCL 5 MG PO TABS
ORAL_TABLET | ORAL | Status: AC
  Filled 2022-06-26: qty 2

## 2022-06-26 MED ORDER — HEPARIN (PORCINE) IN NACL 1000-0.9 UT/500ML-% IV SOLN
INTRAVENOUS | Status: DC | PRN
  Administered 2022-06-26 (×4): 500 mL

## 2022-06-26 MED ORDER — MIDAZOLAM HCL 2 MG/2ML IJ SOLN
INTRAMUSCULAR | Status: AC
  Filled 2022-06-26: qty 2

## 2022-06-26 MED ORDER — MIDAZOLAM HCL 2 MG/2ML IJ SOLN
INTRAMUSCULAR | Status: DC | PRN
  Administered 2022-06-26 (×2): 1 mg via INTRAVENOUS

## 2022-06-26 MED ORDER — SODIUM CHLORIDE 0.9% FLUSH: 3.0000 mL | INTRAVENOUS | Status: DC | PRN

## 2022-06-26 MED ORDER — SODIUM CHLORIDE 0.9 % IV SOLN: 250.0000 mL | INTRAVENOUS | Status: DC | PRN

## 2022-06-26 SURGICAL SUPPLY — 18 items
CATH BALLN WEDGE 5F 110CM (CATHETERS) IMPLANT
CATH INFINITI 5FR MULTPACK ANG (CATHETERS) IMPLANT
CATH INFINITI 6F 1M (CATHETERS) IMPLANT
CATH SUPER TORQUE PLUS 6F MPA1 (CATHETERS) IMPLANT
DEVICE WIRE ANGIOSEAL 6FR (Vascular Products) IMPLANT
GLIDESHEATH SLEND SS 6F .021 (SHEATH) IMPLANT
GUIDEWIRE TIGER .035X300 (WIRE) IMPLANT
KIT HEART LEFT (KITS) ×1 IMPLANT
PACK CARDIAC CATHETERIZATION (CUSTOM PROCEDURE TRAY) ×1 IMPLANT
SHEATH PINNACLE 5F 10CM (SHEATH) IMPLANT
SHEATH PINNACLE 6F 10CM (SHEATH) IMPLANT
SHEATH PINNACLE 7F 10CM (SHEATH) IMPLANT
SHEATH PROBE COVER 6X72 (BAG) IMPLANT
TRANSDUCER W/STOPCOCK (MISCELLANEOUS) ×1 IMPLANT
TUBING CIL FLEX 10 FLL-RA (TUBING) ×1 IMPLANT
WIRE EMERALD 3MM-J .035X150CM (WIRE) IMPLANT
WIRE EMERALD 3MM-J .035X260CM (WIRE) IMPLANT
WIRE MICRO SET SILHO 5FR 7 (SHEATH) IMPLANT

## 2022-06-26 NOTE — Interval H&P Note (Signed)
History and Physical Interval Note:  06/26/2022 6:40 AM  Jodi Bell  has presented today for surgery, with the diagnosis of AORTIC STENOSIS.  The various methods of treatment have been discussed with the patient and family. After consideration of risks, benefits and other options for treatment, the patient has consented to  Procedure(s): RIGHT/LEFT HEART CATH AND CORONARY/GRAFT ANGIOGRAPHY (N/A) as a surgical intervention.  The patient's history has been reviewed, patient examined, no change in status, stable for surgery.  I have reviewed the patient's chart and labs.  Questions were answered to the patient's satisfaction.     [name]

## 2022-06-26 NOTE — Progress Notes (Signed)
Cbc order placed for clinic collection at office in one week.  Updated order per lab request.

## 2022-06-26 NOTE — Discharge Instructions (Signed)
Femoral Site Care This sheet gives you information about how to care for yourself after your procedure. Your health care provider may also give you more specific instructions. If you have problems or questions, contact your health care provider. What can I expect after the procedure?  After the procedure, it is common to have: Bruising that usually fades within 1-2 weeks. Tenderness at the site. Follow these instructions at home: Wound care Follow instructions from your health care provider about how to take care of your insertion site. Make sure you: Wash your hands with soap and water before you change your bandage (dressing). If soap and water are not available, use hand sanitizer. Remove your dressing as told by your health care provider. In 24 hours Do not take baths, swim, or use a hot tub until your health care provider approves. You may shower 24-48 hours after the procedure or as told by your health care provider. Gently wash the site with plain soap and water. Pat the area dry with a clean towel. Do not rub the site. This may cause bleeding. Do not apply powder or lotion to the site. Keep the site clean and dry. Check your femoral site every day for signs of infection. Check for: Redness, swelling, or pain. Fluid or blood. Warmth. Pus or a bad smell. Activity For the first 2-3 days after your procedure, or as long as directed: Avoid climbing stairs as much as possible. Do not squat. Do not lift anything that is heavier than 10 lb (4.5 kg), or the limit that you are told, until your health care provider says that it is safe. For 5 days Rest as directed. Avoid sitting for a long time without moving. Get up to take short walks every 1-2 hours. Do not drive for 24 hours if you were given a medicine to help you relax (sedative). General instructions Take over-the-counter and prescription medicines only as told by your health care provider. Keep all follow-up visits as told by  your health care provider. This is important. Contact a health care provider if you have: A fever or chills. You have redness, swelling, or pain around your insertion site. Get help right away if: The catheter insertion area swells very fast. You pass out. You suddenly start to sweat or your skin gets clammy. The catheter insertion area is bleeding, and the bleeding does not stop when you hold steady pressure on the area. The area near or just beyond the catheter insertion site becomes pale, cool, tingly, or numb. These symptoms may represent a serious problem that is an emergency. Do not wait to see if the symptoms will go away. Get medical help right away. Call your local emergency services (911 in the [place] Do not drive yourself to the hospital. Summary After the procedure, it is common to have bruising that usually fades within 1-2 weeks. Check your femoral site every day for signs of infection. Do not lift anything that is heavier than 10 lb (4.5 kg), or the limit that you are told, until your health care provider says that it is safe. This information is not intended to replace advice given to you by your health care provider. Make sure you discuss any questions you have with your health care provider. Document Revised: 02/09/2017 Document Reviewed: 02/09/2017 Elsevier Patient Education  2020 [employer] 

## 2022-06-26 NOTE — Progress Notes (Signed)
Site area: right femoral artery and vein Site Prior to Removal:  Level 0 Pressure Applied For: 35 minutes for the 59F arterial sheath and 20 minutes for the 39F venous sheath. Manual:   yes Patient Status During Pull:  stable Post Pull Site:  Level 0 Post Pull Instructions Given:  yes Post Pull Pulses Present: DP palpable 3 Dressing Applied:  sterile tegaderm and gauze Bedrest begins @ 11:15 Comments: patient tolerated well.  Dr Lynnette Caffey aware of blood pressure at time of sheath pull. 175/66. Hydralazine total of 20 given (see mar for details) per Dr Lynnette Caffey attempting to lower blood pressure. No bleeding or hematoma before or during sheath pull, and also level 0 at this time.

## 2022-06-26 NOTE — Progress Notes (Signed)
I was called by short stay with the request from the patient for their home oxycodone 10 mg dose for chronic back pain.  No other associated symptoms or complaints and vital signs are stable.  I prescribed oxycodone 10 mg for her back pain

## 2022-06-26 NOTE — Progress Notes (Signed)
Patient refused to have BG check. Explained the important and risk .

## 2022-06-27 ENCOUNTER — Encounter (HOSPITAL_COMMUNITY): Payer: Self-pay | Admitting: Internal Medicine

## 2022-07-02 ENCOUNTER — Telehealth: Payer: Self-pay

## 2022-07-02 NOTE — Telephone Encounter (Signed)
Jodi Bell called into the office to cancel TAVR CT scans that are scheduled on 5/23.  The pt states that she is currently admitted at [facility] due to chest pain and GI bleeding.  Per review of Care Everywhere the pt's Hgb is 7.3.  I will cancel the pt's TAVR testing and continue to follow her chart.

## 2022-07-03 ENCOUNTER — Ambulatory Visit (HOSPITAL_COMMUNITY): Admission: RE | Admit: 2022-07-03 | Payer: Medicare PPO | Source: Ambulatory Visit

## 2022-07-03 ENCOUNTER — Ambulatory Visit: Payer: Medicare PPO

## 2022-07-10 ENCOUNTER — Encounter: Payer: Medicare PPO | Admitting: Surgery

## 2022-07-14 ENCOUNTER — Emergency Department (HOSPITAL_BASED_OUTPATIENT_CLINIC_OR_DEPARTMENT_OTHER)
Admission: EM | Admit: 2022-07-14 | Discharge: 2022-07-14 | Disposition: A | Discharge status: Home / Self Care | Payer: Medicare PPO | Attending: Emergency Medicine | Admitting: Emergency Medicine

## 2022-07-14 ENCOUNTER — Emergency Department (HOSPITAL_BASED_OUTPATIENT_CLINIC_OR_DEPARTMENT_OTHER): Payer: Medicare PPO

## 2022-07-14 ENCOUNTER — Encounter (HOSPITAL_BASED_OUTPATIENT_CLINIC_OR_DEPARTMENT_OTHER): Payer: Self-pay

## 2022-07-14 DIAGNOSIS — R1033 Periumbilical pain: Secondary | ICD-10-CM | POA: Insufficient documentation

## 2022-07-14 DIAGNOSIS — E119 Type 2 diabetes mellitus without complications: Secondary | ICD-10-CM | POA: Diagnosis not present

## 2022-07-14 DIAGNOSIS — R1084 Generalized abdominal pain: Secondary | ICD-10-CM

## 2022-07-14 DIAGNOSIS — R109 Unspecified abdominal pain: Secondary | ICD-10-CM | POA: Diagnosis present

## 2022-07-14 DIAGNOSIS — Z7982 Long term (current) use of aspirin: Secondary | ICD-10-CM | POA: Diagnosis not present

## 2022-07-14 DIAGNOSIS — Z79899 Other long term (current) drug therapy: Secondary | ICD-10-CM | POA: Insufficient documentation

## 2022-07-14 DIAGNOSIS — I10 Essential (primary) hypertension: Secondary | ICD-10-CM | POA: Diagnosis not present

## 2022-07-14 LAB — COMPREHENSIVE METABOLIC PANEL
ALT: 7 U/L (ref 0–44)
AST: 19 U/L (ref 15–41)
Albumin: 2.2 g/dL — ABNORMAL LOW (ref 3.5–5.0)
Alkaline Phosphatase: 111 U/L (ref 38–126)
Anion gap: 6 (ref 5–15)
BUN: 11 mg/dL (ref 8–23)
CO2: 27 mmol/L (ref 22–32)
Calcium: 8.8 mg/dL — ABNORMAL LOW (ref 8.9–10.3)
Chloride: 101 mmol/L (ref 98–111)
Creatinine, Ser: 5.65 mg/dL — ABNORMAL HIGH (ref 0.44–1.00)
GFR, Estimated: 8 mL/min — ABNORMAL LOW (ref >60)
Glucose, Bld: 68 mg/dL — ABNORMAL LOW (ref 70–99)
Potassium: 3.9 mmol/L (ref 3.5–5.1)
Sodium: 134 mmol/L — ABNORMAL LOW (ref 135–145)
Total Bilirubin: 0.1 mg/dL — ABNORMAL LOW (ref 0.3–1.2)
Total Protein: 5.2 g/dL — ABNORMAL LOW (ref 6.5–8.1)

## 2022-07-14 LAB — CBC WITH DIFFERENTIAL/PLATELET
Abs Immature Granulocytes: 0.02 10*3/uL (ref 0.00–0.07)
Basophils Absolute: 0 10*3/uL (ref 0.0–0.1)
Basophils Relative: 0 %
Eosinophils Absolute: 3.5 10*3/uL — ABNORMAL HIGH (ref 0.0–0.5)
Eosinophils Relative: 48 %
HCT: 34.1 % — ABNORMAL LOW (ref 36.0–46.0)
Hemoglobin: 10.5 g/dL — ABNORMAL LOW (ref 12.0–15.0)
Immature Granulocytes: 0 %
Lymphocytes Relative: 16 %
Lymphs Abs: 1.1 10*3/uL (ref 0.7–4.0)
MCH: 31.3 pg (ref 26.0–34.0)
MCHC: 30.8 g/dL (ref 30.0–36.0)
MCV: 101.8 fL — ABNORMAL HIGH (ref 80.0–100.0)
Monocytes Absolute: 0.4 10*3/uL (ref 0.1–1.0)
Monocytes Relative: 6 %
Neutro Abs: 2.2 10*3/uL (ref 1.7–7.7)
Neutrophils Relative %: 30 %
Platelets: 131 10*3/uL — ABNORMAL LOW (ref 150–400)
RBC: 3.35 MIL/uL — ABNORMAL LOW (ref 3.87–5.11)
RDW: 16.6 % — ABNORMAL HIGH (ref 11.5–15.5)
Smear Review: NORMAL
WBC: 7.3 10*3/uL (ref 4.0–10.5)
nRBC: 0 % (ref 0.0–0.2)

## 2022-07-14 LAB — LACTIC ACID, PLASMA: Lactic Acid, Venous: 0.7 mmol/L (ref 0.5–1.9)

## 2022-07-14 LAB — LIPASE, BLOOD: Lipase: 24 U/L (ref 11–51)

## 2022-07-14 MED ORDER — IOHEXOL 350 MG/ML SOLN
100.0000 mL | Freq: Once | INTRAVENOUS | Status: AC | PRN
  Administered 2022-07-14: 100 mL via INTRAVENOUS

## 2022-07-14 MED ORDER — AMOXICILLIN-POT CLAVULANATE 875-125 MG PO TABS: 1.0000 | ORAL_TABLET | Freq: Two times a day (BID) | ORAL | 0 refills | Status: DC

## 2022-07-14 MED ORDER — HYDROCODONE-ACETAMINOPHEN 5-325 MG PO TABS
1.0000 | ORAL_TABLET | Freq: Once | ORAL | Status: AC
  Administered 2022-07-14: 1 via ORAL
  Filled 2022-07-14: qty 1

## 2022-07-14 MED ORDER — ONDANSETRON 4 MG PO TBDP
4.0000 mg | ORAL_TABLET | Freq: Once | ORAL | Status: AC
  Administered 2022-07-14: 4 mg via ORAL
  Filled 2022-07-14: qty 1

## 2022-07-14 MED ORDER — OXYCODONE-ACETAMINOPHEN 5-325 MG PO TABS: 1.0000 | ORAL_TABLET | Freq: Four times a day (QID) | ORAL | 0 refills | Status: DC | PRN

## 2022-07-14 MED ORDER — OXYCODONE-ACETAMINOPHEN 5-325 MG PO TABS
1.0000 | ORAL_TABLET | Freq: Once | ORAL | Status: AC
  Administered 2022-07-14: 1 via ORAL
  Filled 2022-07-14: qty 1

## 2022-07-14 MED ORDER — MORPHINE SULFATE (PF) 2 MG/ML IV SOLN
2.0000 mg | Freq: Once | INTRAVENOUS | Status: AC
  Administered 2022-07-14: 2 mg via INTRAVENOUS
  Filled 2022-07-14: qty 1

## 2022-07-14 NOTE — ED Notes (Signed)
Attempted IV start x 2, in vein with blood return both times, but blew with flush; will have another RN attempt

## 2022-07-14 NOTE — ED Provider Notes (Signed)
Accepted handoff at shift change from [facility]. Please see prior provider note for more detail.   Briefly: Patient is 62 y.o.   Per prior PA "Jodi Bell is a 62 y.o. female with a history of ESRD, anemia, and chronic respiratory failure who presents to the ED for abdominal pain. Patient reports that the pain began while she was at dialysis today and when began to have diarrhea once her treatment was over. Patient reports bright red blood per rectum by the time she got home, which has persisted since. She denies any blood thinner use.   Patient reports that she was admitted to [facility] where she had an EGD to further evaluate a GI bleed and was given blood as well. She reports that she had some pre-cancerous polyps on exam. They did not do a colonoscopy at that time. "   DDX: concern for ischemic colitis  Plan: FU on CT imaging       Physical Exam  BP (!) 179/73   Pulse 74   Temp 98.2 F (36.8 C) (Oral)   Resp 17   Ht 5\' 2"  (1.575 m)   Wt 61 kg   SpO2 100%   BMI 24.60 kg/m   Physical Exam Vitals and nursing note reviewed.  Constitutional:      General: She is not in acute distress. HENT:     Head: Normocephalic and atraumatic.     Nose: Nose normal.     Mouth/Throat:     Mouth: Mucous membranes are moist.  Eyes:     General: No scleral icterus. Cardiovascular:     Rate and Rhythm: Normal rate and regular rhythm.     Pulses: Normal pulses.     Heart sounds: Normal heart sounds.  Pulmonary:     Effort: Pulmonary effort is normal. No respiratory distress.     Breath sounds: No wheezing.  Abdominal:     Palpations: Abdomen is soft.     Tenderness: There is no abdominal tenderness.  Musculoskeletal:     Cervical back: Normal range of motion.     Right lower leg: No edema.     Left lower leg: No edema.  Skin:    General: Skin is warm and dry.     Capillary Refill: Capillary refill takes less than 2 seconds.  Neurological:     Mental Status: She  is alert. Mental status is at baseline.  Psychiatric:        Mood and Affect: Mood normal.        Behavior: Behavior normal.     Procedures  Procedures Results for orders placed or performed during the hospital encounter of 07/14/22  CBC with Differential  Result Value Ref Range   WBC 7.3 4.0 - 10.5 K/uL   RBC 3.35 (L) 3.87 - 5.11 MIL/uL   Hemoglobin 10.5 (L) 12.0 - 15.0 g/dL   HCT 16.1 (L) 09.6 - 04.5 %   MCV 101.8 (H) 80.0 - 100.0 fL   MCH 31.3 26.0 - 34.0 pg   MCHC 30.8 30.0 - 36.0 g/dL   RDW 40.9 (H) 81.1 - 91.4 %   Platelets 131 (L) 150 - 400 K/uL   nRBC 0.0 0.0 - 0.2 %   Neutrophils Relative % 30 %   Neutro Abs 2.2 1.7 - 7.7 K/uL   Lymphocytes Relative 16 %   Lymphs Abs 1.1 0.7 - 4.0 K/uL   Monocytes Relative 6 %   Monocytes Absolute 0.4 0.1 - 1.0 K/uL  Eosinophils Relative 48 %   Eosinophils Absolute 3.5 (H) 0.0 - 0.5 K/uL   Basophils Relative 0 %   Basophils Absolute 0.0 0.0 - 0.1 K/uL   WBC Morphology MORPHOLOGY UNREMARKABLE    RBC Morphology MORPHOLOGY UNREMARKABLE    Smear Review Normal platelet morphology    Immature Granulocytes 0 %   Abs Immature Granulocytes 0.02 0.00 - 0.07 K/uL  Comprehensive metabolic panel  Result Value Ref Range   Sodium 134 (L) 135 - 145 mmol/L   Potassium 3.9 3.5 - 5.1 mmol/L   Chloride 101 98 - 111 mmol/L   CO2 27 22 - 32 mmol/L   Glucose, Bld 68 (L) 70 - 99 mg/dL   BUN 11 8 - 23 mg/dL   Creatinine, Ser 1.61 (H) 0.44 - 1.00 mg/dL   Calcium 8.8 (L) 8.9 - 10.3 mg/dL   Total Protein 5.2 (L) 6.5 - 8.1 g/dL   Albumin 2.2 (L) 3.5 - 5.0 g/dL   AST 19 15 - 41 U/L   ALT 7 0 - 44 U/L   Alkaline Phosphatase 111 38 - 126 U/L   Total Bilirubin 0.1 (L) 0.3 - 1.2 mg/dL   GFR, Estimated 8 (L) >60 mL/min   Anion gap 6 5 - 15  Lipase, blood  Result Value Ref Range   Lipase 24 11 - 51 U/L  Lactic acid, plasma  Result Value Ref Range   Lactic Acid, Venous 0.7 0.5 - 1.9 mmol/L   CT Angio Abd/Pel W and/or Wo Contrast  Result Date:  07/14/2022 CLINICAL DATA:  Mesenteric ischemia, acute concern for ischemic colitis w GI bleeding, pain out of proportion, and colitis EXAM: CTA ABDOMEN AND PELVIS WITHOUT AND WITH CONTRAST TECHNIQUE: Multidetector CT imaging of the abdomen and pelvis was performed using the standard protocol during bolus administration of intravenous contrast. Multiplanar reconstructed images and MIPs were obtained and reviewed to evaluate the vascular anatomy. RADIATION DOSE REDUCTION: This exam was performed according to the departmental dose-optimization program which includes automated exposure control, adjustment of the mA and/or kV according to patient size and/or use of iterative reconstruction technique. CONTRAST:  OMNIPAQUE IOHEXOL 350 MG/ML SOLN COMPARISON:  07/14/2022, 02/25/2022. FINDINGS: VASCULAR Aorta: Aortic atherosclerosis and tortuosity. No aneurysm or dissection. Celiac: Patent without evidence of aneurysm, dissection, vasculitis or significant stenosis. SMA: Patent without evidence of aneurysm, dissection, vasculitis or significant stenosis. Renals: Calcified plaque proximally bilaterally. Vessels are patent. No dissection. IMA: Patent without evidence of aneurysm, dissection, vasculitis or significant stenosis. Inflow: Atherosclerotic calcifications. No aneurysm or dissection or significant stenosis. Proximal Outflow: Atherosclerotic calcification. No aneurysm, dissection or significant stenosis. Veins: No obvious venous abnormality within the limitations of this arterial phase study. Review of the MIP images confirms the above findings. NON-VASCULAR Lower chest: Cardiomegaly. Coronary artery and aortic atherosclerosis. No acute abnormality. Hepatobiliary: No focal hepatic abnormality. Gallbladder unremarkable. Pancreas: No focal abnormality or ductal dilatation. Spleen: No focal abnormality.  Normal size. Adrenals/Urinary Tract: Bilateral renal cysts appear benign. No follow-up imaging recommended. No  hydronephrosis. Adrenal glands and urinary bladder unremarkable. Stomach/Bowel: Diffuse colonic diverticulosis. Colonic wall thickening involving the right colon and transverse colon compatible with colitis. No bowel obstruction. Stomach and small bowel unremarkable. Lymphatic: No adenopathy. Reproductive: Uterus and adnexa unremarkable.  No mass. Other: Trace free fluid in the abdomen and pelvis.  No free air. Musculoskeletal: Diffuse edema throughout the subcutaneous soft tissues compatible with anasarca. Postoperative changes in the lumbar spine. Severe compression fracture involving the L3 vertebral body with posterior fusion  changes at L3-4. Collapse of the inferior endplate of L2 with irregularity of the L2-3 disc space, stable since prior study. Chronic diskitis or osteomyelitis cannot be excluded. IMPRESSION: VASCULAR Aortic atherosclerosis.  No aneurysm or dissection. Mesenteric vessels are patent without visible significant stenosis, aneurysm or dissection. NON-VASCULAR Diffuse colonic diverticulosis. Wall thickening involving the right colon and transverse colon compatible with colitis. This is most likely infectious or inflammatory. Ischemic is less likely given the patency of the mesenteric vessels. Trace free fluid in the abdomen or pelvis. Anasarca like edema throughout the abdominal wall. Cardiomegaly, coronary artery disease. Collapse of the L2 and L3 vertebral bodies with irregularity at the L2-3 disc space. Cannot exclude chronic discitis or osteomyelitis. However, these findings are unchanged since 02/25/2022. Electronically Signed   By: Charlett Nose M.D.   On: 07/14/2022 20:40   CT ABDOMEN PELVIS WO CONTRAST  Result Date: 07/14/2022 CLINICAL DATA:  Acute abdominal pain EXAM: CT ABDOMEN AND PELVIS WITHOUT CONTRAST TECHNIQUE: Multidetector CT imaging of the abdomen and pelvis was performed following the standard protocol without IV contrast. RADIATION DOSE REDUCTION: This exam was performed  according to the departmental dose-optimization program which includes automated exposure control, adjustment of the mA and/or kV according to patient size and/or use of iterative reconstruction technique. COMPARISON:  CT abdomen and pelvis 02/25/2022 FINDINGS: Lower chest: No acute abnormality.  Heart is enlarged. Hepatobiliary: No focal liver abnormality is seen. No gallstones, gallbladder wall thickening, or biliary dilatation. Pancreas: Unremarkable. No pancreatic ductal dilatation or surrounding inflammatory changes. Spleen: Normal in size without focal abnormality. Adrenals/Urinary Tract: The bladder is decompressed and grossly within normal limits. Punctate bilateral renal calculi are again seen. There is no hydronephrosis. Bilateral renal cortical cysts appear similar to prior examination with the largest measuring 2.6 cm in the right kidney. Adrenal glands are unremarkable. Stomach/Bowel: There is wall thickening and inflammatory stranding of the ascending colon, hepatic flexure and transverse colon. There is no pneumatosis or free air. There is no bowel obstruction. The appendix appears within normal limits. There is colonic diverticulosis without evidence for acute diverticulitis. There is a single surgical clips seen within the stomach (2 surgical clips are seen on the prior study. Small bowel loops are within normal limits. Vascular/Lymphatic: Aortic atherosclerosis. No enlarged abdominal or pelvic lymph nodes. Reproductive: Uterus and bilateral adnexa are unremarkable. Other: There is a small amount of free fluid in the abdomen and pelvis similar to prior. Diffuse body wall edema has increased. Musculoskeletal: Chronic appearing deformity of L2 is unchanged. L3-L4 posterior fusion hardware is unchanged. Lucent lesion in T12 appears unchanged. IMPRESSION: 1. Findings compatible with colitis involving the ascending colon, hepatic flexure and transverse colon. No bowel obstruction or free air. 2. Stable  small amount of free fluid in the abdomen and pelvis. 3. Colonic diverticulosis. 4. Nonobstructing bilateral renal calculi. 5. Increased body wall edema. 6. Right Bosniak I benign renal cyst measuring 2.6 cm. No follow-up imaging is recommended. JACR 2018 Feb; 264-273, Management of the Incidental Renal Mass on CT, [employer] 2021; 814-848, Bosniak Classification of Cystic Renal Masses, Version 2019. Aortic Atherosclerosis (ICD10-I70.0). Electronically Signed   By: Darliss Cheney M.D.   On: 07/14/2022 17:27   CARDIAC CATHETERIZATION  Result Date: 06/26/2022   Mid LAD-2 lesion is 30% stenosed.   Mid LAD-2 lesion is 30% stenosed.   Prox RCA lesion is 20% stenosed.   Mid RCA lesion is 95% stenosed.   Dist RCA lesion is 100% stenosed.   Origin lesion is 100%  stenosed.   Mid LAD lesion is 100% stenosed.   Prox Cx to Mid Cx lesion is 40% stenosed. 1.  Patent LIMA to LAD and occluded vein graft to PDA. 2.  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 3.  Fick cardiac output of 11.7 L/min and Fick cardiac index of 7.2 L/min/m with the following hemodynamics:  Right atrial pressure mean of 14 mmHg  Right ventricular pressure 67/10 with an end-diastolic pressure of 20 mmHg  Pulmonary artery pressure 70/29 with a mean of 43 mmHg  Wedge pressure mean of 27 mmHg  Pulmonary artery pulsatility index of 2.9  Pulmonary vascular resistance of 1.4 Woods units Recommendation: Continue evaluation for aortic valve intervention; check CBC next week.     ED Course / MDM   Clinical Course as of 07/14/22 2218  Mon Jul 14, 2022  1855 Stable 63 YOF with  an extensive medical history here with [facility] On exam it is present. Just admitted to NH for UGIB.  [CC]  1907 [facility].  [WF]  2102 IMPRESSION: VASCULAR  Aortic atherosclerosis.  No aneurysm or dissection.  Mesenteric vessels are patent without visible significant  stenosis, aneurysm or dissection.  NON-VASCULAR  Diffuse colonic diverticulosis.  Wall thickening involving the right colon and transverse colon compatible with colitis. This is most likely infectious or inflammatory. Ischemic is less likely given the patency of the mesenteric vessels.  Trace free fluid in the abdomen or pelvis.  Anasarca like edema throughout the abdominal wall.  Cardiomegaly, coronary artery disease.  Collapse of the L2 and L3 vertebral bodies with irregularity at the L2-3 disc space. Cannot exclude chronic discitis or osteomyelitis. However, these findings are unchanged since 02/25/2022.   [WF]  [address] [facility] [WF]    Clinical Course User Index [CC] Glyn Ade, MD [WF] Gailen Shelter, [place]   Medical Decision Making Amount and/or Complexity of Data Reviewed Labs: ordered. Radiology: ordered.  Risk Prescription drug management.   Jodi Bell is a 62 y.o. female with a history of ESRD, anemia, and chronic respiratory failure who presents to the ED for abdominal pain. Patient reports that the pain began while she was at dialysis today and when began to have diarrhea once her treatment was over. Patient reports bright red blood per rectum by the time she got home, which has persisted since. She denies any blood thinner use.   Patient reports that she was admitted to [facility] where she had an EGD to further evaluate a GI bleed and was given blood as well. She reports that she had some pre-cancerous polyps on exam. They did not do a colonoscopy at that time.    Patient has reassuring CBC with anemia that is significantly improved from baseline at 10.5 BUN normal which is reassuring in the setting of some GI bleeding.  Vital signs normal here in the ER  CT angio does not show any ischemic colitis patent vasculature and does show some evidence of colitis.  No evidence of active bleed on imaging which is reassuring. IMPRESSION:   VASCULAR    Aortic atherosclerosis.  No aneurysm or dissection.    Mesenteric vessels are patent without visible significant stenosis,  aneurysm or dissection.    NON-VASCULAR    Diffuse colonic diverticulosis.    Wall thickening involving the right colon and transverse colon  compatible with colitis. This is most likely infectious or  inflammatory. Ischemic is less likely given the patency of the  mesenteric vessels.    Trace free fluid in the abdomen or pelvis.    Anasarca like edema throughout the abdominal wall.    Cardiomegaly, coronary artery disease.    Collapse of the L2 and L3 vertebral bodies with irregularity at the  L2-3 disc space. Cannot exclude chronic discitis or osteomyelitis.  However, these findings are unchanged since 02/25/2022.   I do lengthy shared decision-making conversation with patient and Annice Pih who is patient's sister.  We discussed the pros and cons of admission for observation given her GI bleeding versus home with strict return precautions to [facility] or [facility] emergency room should she have new or worsening symptoms.  Patient states that she would prefer to be discharged home.  She understands the importance of close monitoring of her symptoms and tells me that she will return immediately to emergency room for any new or concerning symptoms.  Will treat with Augmentin for presumed infectious colitis given blood in her stool although bacterial infection perhaps is slightly less likely given no leukocytosis, fever, tachycardia or systemic illness.    Solon Augusta Bridger, [place] 07/14/22 2222    Glyn Ade, MD 07/14/22 2321

## 2022-07-14 NOTE — ED Triage Notes (Signed)
Abdominal pain started at dialysis approx 8 am +nausea and states she has had liquid. Bloody stool

## 2022-07-14 NOTE — Discharge Instructions (Addendum)
I recommend relatively bland food diet such as bananas rice applesauce toast avocados and vegetables as this can help with diarrhea.  Make sure drink plenty of water follow-up with [facility] gastroenterology I also recommend that you follow-up with your primary care provider.

## 2022-07-14 NOTE — ED Notes (Signed)
Pt reports her son is driving her home.  

## 2022-07-14 NOTE — ED Provider Notes (Signed)
[place] [facility] Provider Note   CSN: [id number] Arrival date & time: 07/14/22  1533     History  Chief Complaint  Patient presents with   Abdominal Pain    Jodi Bell is a 62 y.o. female with a history of ESRD, anemia, and chronic respiratory failure who presents to the ED for abdominal pain. Patient reports that the pain began while she was at dialysis today and when began to have diarrhea once her treatment was over. Patient reports bright red blood per rectum by the time she got home, which has persisted since. She denies any blood thinner use.  Patient reports that she was admitted to [facility] where she had an EGD to further evaluate a GI bleed and was given blood as well. She reports that she had some pre-cancerous polyps on exam. They did not do a colonoscopy at that time.      Home Medications Prior to Admission medications   Medication Sig Start Date End Date Taking? Authorizing Provider  acetaminophen (TYLENOL) 500 MG tablet Take 1,000 mg by mouth daily as needed (pain).    [provider]  albuterol (PROVENTIL) (2.5 MG/3ML) 0.083% nebulizer solution Take 3 mLs (2.5 mg total) by nebulization every 6 (six) hours as needed for wheezing or shortness of breath. 07/10/21   Hunsucker, Lesia Sago, MD  allopurinol (ZYLOPRIM) 100 MG tablet Take 100 mg by mouth every evening.    [provider]  aspirin EC 81 MG tablet Take 81 mg by mouth every other day. Swallow whole.    [provider]  B Complex-C (SUPER B COMPLEX PO) Take 1 tablet by mouth 4 (four) times a week. Take on Sun, Tues, Thurs, and Sat    [provider]  Calcium Acetate 667 MG TABS Take 667 mg by mouth See admin instructions. Take 667 mg with each meal and snack    [provider]  Camphor-Eucalyptus-Menthol (VICKS VAPORUB EX) Apply 1 Application topically at bedtime as needed (congestion).    [provider]   carboxymethylcellulose 1 % ophthalmic solution Place 1 drop into both eyes 3 (three) times daily.    [provider]  carvedilol (COREG) 25 MG tablet Take 25 mg by mouth 2 (two) times daily with a meal.    [provider]  cetirizine (ZYRTEC) 10 MG tablet Take 10 mg by mouth daily as needed for allergies.    [provider]  Cholecalciferol (VITAMIN D) 50 MCG (2000 UT) tablet Take 2,000 Units by mouth daily.    [provider]  cinacalcet (SENSIPAR) 30 MG tablet Take 60 mg by mouth every Monday, Wednesday, and Friday with hemodialysis.    [provider]  cloNIDine (CATAPRES - DOSED IN MG/24 HR) 0.3 mg/24hr patch Place 0.3 mg onto the skin every Sunday.    [provider]  cyclobenzaprine (FLEXERIL) 10 MG tablet Take 10 mg by mouth 3 (three) times daily as needed for muscle spasms.    [provider]  Darbepoetin Alfa (ARANESP) 200 MCG/0.4ML SOSY injection Inject 0.4 mLs (200 mcg total) into the vein every Wednesday with hemodialysis. 10/10/20   Gershon Crane E, PA-C  diclofenac Sodium (VOLTAREN) 1 % GEL Apply 2 g topically daily as needed (pain).    [provider]  docusate sodium (COLACE) 100 MG capsule Take 100 mg by mouth daily.    [provider]  EPINEPHrine 0.3 mg/0.3 mL IJ SOAJ injection Inject into the muscle  as needed for anaphylaxis. 07/12/21   [provider]  ferrous sulfate 325 (65 FE) MG tablet Take 325 mg by mouth 2 (two) times a week. Tues and Thurs    [provider]  fluticasone (FLONASE) 50 MCG/ACT nasal spray Place 1 spray into both nostrils daily as needed for allergies or rhinitis.    [provider]  heparin 1000 unit/mL SOLN injection 1 mL (1,000 Units total) by Dialysis route as needed (in dialysis). 10/04/20   Gershon Crane E, PA-C  hydrALAZINE (APRESOLINE) 100 MG tablet Take 1 tablet (100 mg total) by mouth every 8 (eight) hours. 07/06/20   Cresenzo, Cyndi Lennert, MD   hydrocortisone (ANUSOL-HC) 2.5 % rectal cream Place rectally 2 (two) times daily. Patient taking differently: Place 1 Application rectally 2 (two) times daily as needed for anal itching or hemorrhoids. 07/23/21   Park Pope, MD  isosorbide mononitrate (IMDUR) 60 MG 24 hr tablet Take 1 tablet (60 mg total) by mouth daily. 08/14/21   Bhagat, Bhavinkumar, PA  LORazepam (ATIVAN) 1 MG tablet Take 1 mg by mouth every 8 (eight) hours as needed for anxiety. 06/25/20   [provider]  montelukast (SINGULAIR) 10 MG tablet Take 10 mg by mouth daily with supper.     [provider]  multivitamin (RENA-VIT) TABS tablet Take 1 tablet by mouth every Monday, Wednesday, and Friday.    [provider]  NIFEdipine (PROCARDIA XL/NIFEDICAL XL) 60 MG 24 hr tablet Take 60 mg by mouth 2 (two) times daily. 10/01/21   [provider]  olmesartan (BENICAR) 40 MG tablet Take 40 mg by mouth every evening. 05/06/19   [provider]  omeprazole (PRILOSEC) 40 MG capsule Take 40 mg by mouth daily.    [provider]  ondansetron (ZOFRAN) 4 MG tablet Take 4 mg by mouth 2 (two) times daily as needed for nausea or vomiting.    [provider]  Oxycodone HCl 10 MG TABS Take 10 mg by mouth 2 (two) times daily as needed for pain. 01/11/21   [provider]  OXYGEN Inhale 2 L into the lungs as directed.    [provider]  pantoprazole (PROTONIX) 40 MG tablet Take 40 mg by mouth daily. 06/09/20   [provider]  predniSONE (DELTASONE) 5 MG tablet Take 1 tablet (5 mg total) by mouth daily with breakfast. 02/27/22   Marrianne Mood, MD  Probiotic Product (PROBIOTIC PO) Take 1 capsule by mouth daily.    [provider]  silver sulfADIAZINE (SILVADENE) 1 % cream Apply 1 Application topically daily as needed (wound care).    [provider]  sorbitol 70 % SOLN Take 30 ml 1-2 times daily as needed for constipation 07/23/21   Park Pope, MD   Tiotropium Bromide-Olodaterol (STIOLTO RESPIMAT) 2.5-2.5 MCG/ACT AERS Inhale 2 puffs once daily into the lungs. 11/28/21   Hunsucker, Lesia Sago, MD  VENTOLIN HFA 108 (90 Base) MCG/ACT inhaler Inhale 2 puffs into the lungs See admin instructions. Take 2 puffs every 4-6 hours as needed for wheezing and shortness of breath 04/08/22   Hunsucker, Lesia Sago, MD  White Petrolatum-Mineral Oil (SOOTHE NIGHTTIME) OINT Place 1 Application into both eyes at bedtime.    [provider]  zolpidem (AMBIEN) 5 MG tablet Take 5 mg by mouth at bedtime as needed for sleep. 02/04/21   [provider]      Allergies    3-methyl-2-benzothiazolinone hydrazone, Banana, Black walnut flavor, Hazelnut (filbert), Leflunomide and related, Lisinopril,  No healthtouch food allergies, Other, Pecan nut (diagnostic), Trazodone and nefazodone, Adalimumab, Escitalopram oxalate, Ezetimibe, Secukinumab, Statins, Ferrlecit [na ferric gluc cplx in sucrose], Gabapentin, and Nsaids    Review of Systems   Review of Systems  Gastrointestinal:  Positive for abdominal pain.    Physical Exam Updated Vital Signs BP 127/65 (BP Location: Right Arm)   Pulse 71   Temp 97.6 F (36.4 C)   Resp 16   Ht 5\' 2"  (1.575 m)   Wt 61 kg   SpO2 96%   BMI 24.60 kg/m  Physical Exam Vitals and nursing note reviewed.  Constitutional:      Appearance: Normal appearance.  HENT:     Head: Normocephalic and atraumatic.     Mouth/Throat:     Mouth: Mucous membranes are moist.  Eyes:     Conjunctiva/sclera: Conjunctivae normal.     Pupils: Pupils are equal, round, and reactive to light.  Cardiovascular:     Rate and Rhythm: Normal rate and regular rhythm.     Pulses: Normal pulses.  Pulmonary:     Effort: Pulmonary effort is normal.     Breath sounds: Normal breath sounds.  Abdominal:     Palpations: Abdomen is soft.     Tenderness: There is abdominal tenderness in the periumbilical area.  Genitourinary:    Comments: Bright  red blood in adult diaper Skin:    General: Skin is warm and dry.     Findings: No rash.  Neurological:     General: No focal deficit present.     Mental Status: She is alert.  Psychiatric:        Mood and Affect: Mood normal.        Behavior: Behavior normal.     ED Results / Procedures / Treatments   Labs (all labs ordered are listed, but only abnormal results are displayed) Labs Reviewed  COMPREHENSIVE METABOLIC PANEL  LIPASE, BLOOD  CBC WITH DIFFERENTIAL/PLATELET    EKG None  Radiology No results found.  Procedures Procedures: not indicated.   Medications Ordered in ED Medications - No data to display  ED Course/ Medical Decision Making/ A&P Clinical Course as of 07/14/22 1915  Mon Jul 14, 2022  9147 Stable 68 YOF with  an extensive medical history here with [facility] On exam it is present. Just admitted to NH for UGIB.  [CC]  1907 [facility].  [WF]    Clinical Course User Index [CC] Glyn Ade, MD [WF] [facility], [place]                             Medical Decision Making Amount and/or Complexity of Data Reviewed Labs: ordered.   This patient presents to the ED for concern of abdominal pain, this involves an extensive number of treatment options, and is a complaint that carries with it a high risk of complications and morbidity.   Differential diagnosis includes: upper vs lower GI bleed, hemorrhoids, hernia, colitis, diverticulitis.   Co morbidities that complicate the patient evaluation  ESRD Diabetes mellitus HTN GERD OSA Paroxysmal atrial fibrillation History of upper GI bleed  Labs and imaging pending at time of discharge. Patient care taken over by [facility]       Final Clinical Impression(s) / ED Diagnoses Final diagnoses:  None    Rx / DC Orders ED Discharge Orders     None  Maxwell Marion, PA-C 07/14/22 2023    Glyn Ade, MD 07/17/22 [phone]

## 2022-07-15 LAB — PATHOLOGIST SMEAR REVIEW: Path Review: NORMAL

## 2022-07-18 ENCOUNTER — Encounter (HOSPITAL_COMMUNITY): Payer: Self-pay

## 2022-07-24 ENCOUNTER — Ambulatory Visit (HOSPITAL_COMMUNITY)
Admission: RE | Admit: 2022-07-24 | Discharge: 2022-07-24 | Disposition: A | Discharge status: Home / Self Care | Payer: Medicare PPO | Source: Ambulatory Visit | Attending: Internal Medicine | Admitting: Internal Medicine

## 2022-07-24 ENCOUNTER — Telehealth (HOSPITAL_COMMUNITY): Payer: Medicare PPO

## 2022-07-24 DIAGNOSIS — I350 Nonrheumatic aortic (valve) stenosis: Secondary | ICD-10-CM | POA: Diagnosis present

## 2022-07-24 MED ORDER — IOHEXOL 350 MG/ML SOLN
100.0000 mL | Freq: Once | INTRAVENOUS | Status: AC | PRN
  Administered 2022-07-24: 100 mL via INTRAVENOUS

## 2022-07-24 NOTE — Telephone Encounter (Signed)
Pt returned PR phone call and stated she is not interested at this time due to medical issues.  Closed referral

## 2022-08-17 NOTE — Progress Notes (Unsigned)
Patient ID: Jodi Bell MRN: [id number] DOB/AGE: [birth year] 62 y.o.  Primary Care Physician:Jobe, [name]., MD Primary Cardiologist: Donato Schultz, MD  FOCUSED CARDIOVASCULAR PROBLEM LIST:   1.  Aortic stenosis with an aortic valve area of 0.86 cm2, peak velocity of 4.1 m/s, and mean gradient 36 mmHg and ejection fraction is 65 to 70% on [facility] echocardiogram May 2023; EKG demonstrates sinus rhythm without bundle-branch blocks 2.  Coronary artery disease status post CABG consisting of a LIMA to LAD and vein graft to PDA 2022 3.  End-stage renal disease on hemodialysis 4.  Chronic hypoxia on chronic supplemental oxygen 5.  Type 2 diabetes mellitus, diet-controlled 6.  Hypertension  HISTORY OF PRESENT ILLNESS:  May 2024: The patient is a 62 y.o. female with the indicated medical history here for recommendations regarding the patient's severe aortic stenosis.  The patient is here with her daughter.  She tells me over the last few months she has become increasingly more short of breath.  She contracted COVID in early 2023 and since that time she seems to have developed an oxygen requirement.  She has been evaluated by pulmonology and despite rather long smoking history her PFTs demonstrate no evidence of COPD but was concerning for interstitial lung disease.  However high-resolution chest CT in 2023 was negative for ILD.  She tells me she has been increasingly short of breath with rather minimal exertion.  Supplemental oxygen seems to help the symptoms but does not relieve them entirely.  She is not short of breath at rest.  Prior to contracting COVID she was quite active and able to do all of her activities of daily living without any issues.  She has been on dialysis for about 5 or 6 years without any limitations.  Notably she has had no issues with shortened dialysis or low blood pressures during dialysis.  She has had no infectious issues recently as well.  She denies chest pain, presyncope,  or syncope.  She does report orthopnea.  She has some mild right lower extremity swelling which is limited and seems to have occurred after her vein harvest for her CABG procedure.  She fortunately has not required any recent emergency room visits or hospitalizations.  She has a few remaining teeth left and is planning to make an appointment to have these extracted so that she can be fitted for full dentures.  Today: The patient underwent cardiac catheterization which demonstrated patent LIMA and vein graft to PDA.  A TAVR protocol CTA demonstrated dense annular calcification of about half of the perimeter.  Opinion was that anatomy was at risk for PVL or annular rupture after TAVR procedure.  She is here to discuss those findings.  The patient tells me she actually feels better.  Her hemoglobin has risen which is corresponded with this improvement.  She is able to walk around her house without much difficulty.  Her dialysis sessions have not been interrupted due to low blood pressures.  She denies any presyncope or syncope.  She denies any chest pressure or anginal symptoms.  She is relatively sedentary but able to do most of her activities of daily living without any issues.  Past Medical History:  Diagnosis Date   Anemia of chronic disease    Anxiety    Arthritis    oa and psoriatic ra   Asthma    Back pain, chronic    lower back   Chronic insomnia    COPD (chronic obstructive pulmonary disease) (HCC)  Diabetes mellitus    type 2   ESRD on dialysis [facility])    Essential hypertension    GERD (gastroesophageal reflux disease)    Gout    History of hiatal hernia    Hypoalbuminemia    Hyponatremia    Mild aortic stenosis    Mitral regurgitation    Mitral stenosis    Obesity    PAD (peripheral artery disease) (HCC)    a. externial iliac calcification seen on CT 04/2020.   Sleep apnea    Tobacco abuse    Upper GI bleed 05/2019    Past Surgical History:  Procedure Laterality Date    BACK SURGERY  2016   lower back fusion with cage   BIOPSY  09/23/2017   Procedure: BIOPSY;  Surgeon: Graylin Shiver, MD;  Location: [facility] ENDOSCOPY;  Service: Endoscopy;;   BIOPSY  05/19/2019   Procedure: BIOPSY;  Surgeon: Kathi Der, MD;  Location: [facility];  Service: Gastroenterology;;   CESAREAN SECTION  1990   x 1    COLONOSCOPY WITH PROPOFOL N/A 09/23/2017   Procedure: COLONOSCOPY WITH PROPOFOL Hemostatic clips placed;  Surgeon: Graylin Shiver, MD;  Location: [facility];  Service: Endoscopy;  Laterality: N/A;   COLONOSCOPY WITH PROPOFOL N/A 10/02/2020   Procedure: COLONOSCOPY WITH PROPOFOL;  Surgeon: Willis Modena, MD;  Location: [facility] ENDOSCOPY;  Service: Endoscopy;  Laterality: N/A;   CORONARY ARTERY BYPASS GRAFT N/A 09/13/2020   Procedure: CORONARY ARTERY BYPASS GRAFTING (CABG), ON PUMP, TIMES TWO, USING LEFT INTERNAL MAMMARY ARTERY AND RIGHT ENDOSCOPICALLY HARVESTED GREATER SAPHENOUS VEIN;  Surgeon: Alleen Borne, MD;  Location: [facility] OR;  Service: Open Heart Surgery;  Laterality: N/A;   DILATION AND CURETTAGE OF UTERUS  1988   ENTEROSCOPY N/A 07/22/2021   Procedure: ENTEROSCOPY;  Surgeon: Kerin Salen, MD;  Location: [facility] ENDOSCOPY;  Service: Gastroenterology;  Laterality: N/A;   ESOPHAGOGASTRODUODENOSCOPY N/A 09/22/2020   Procedure: ESOPHAGOGASTRODUODENOSCOPY (EGD);  Surgeon: Willis Modena, MD;  Location: [facility] ENDOSCOPY;  Service: Endoscopy;  Laterality: N/A;   ESOPHAGOGASTRODUODENOSCOPY (EGD) WITH PROPOFOL N/A 09/23/2017   Procedure: ESOPHAGOGASTRODUODENOSCOPY (EGD) WITH PROPOFOL;  Surgeon: Graylin Shiver, MD;  [facility];  Service: Endoscopy;  Laterality: N/A;   ESOPHAGOGASTRODUODENOSCOPY (EGD) WITH PROPOFOL N/A 05/19/2019   Procedure: ESOPHAGOGASTRODUODENOSCOPY (EGD) WITH PROPOFOL;  Surgeon: Kathi Der, MD;  [facility];  Service: Gastroenterology;  Laterality: N/A;   ESOPHAGOGASTRODUODENOSCOPY (EGD) WITH PROPOFOL N/A 10/02/2020   Procedure:  ESOPHAGOGASTRODUODENOSCOPY (EGD) WITH PROPOFOL;  Surgeon: Willis Modena, MD;  Location: [facility] ENDOSCOPY;  Service: Endoscopy;  Laterality: N/A;   GIVENS CAPSULE STUDY N/A 05/19/2019   Procedure: GIVENS CAPSULE STUDY;  Surgeon: Kathi Der, MD;  Location[facility]OS[facility];  Service: Gastroenterology;  Laterality: N/A;   GIVENS CAPSULE STUDY N/A 07/19/2021   Procedure: GIVENS CAPSULE STUDY;  Surgeon: [name], MD;  Location: [facility] ENDOSCOPY;  Service: Gastroenterology;  Laterality: N/A;   HEMOSTASIS CLIP PLACEMENT  09/22/2020   Procedure: HEMOSTASIS CLIP PLACEMENT;  Surgeon: Willis Modena, MD;  Location[facility];  Service: Endoscopy;;   HEMOSTASIS CONTROL  09/22/2020   Procedure: HEMOSTASIS CONTROL;  Surgeon: Willis Modena, MD;  Location: [facility] ENDOSCOPY;  Service: Endoscopy;;   HOT HEMOSTASIS N/A 05/19/2019   Procedure: HOT HEMOSTASIS (ARGON PLASMA COAGULATION/BICAP);  Surgeon: Kathi Der, MD;  Location: [facility] ENDOSCOPY;  Service: Gastroenterology;  Laterality: N/A;   LEFT HEART CATH AND CORONARY ANGIOGRAPHY N/A 09/10/2020   Procedure: LEFT HEART CATH AND CORONARY ANGIOGRAPHY;  Surgeon: Yvonne Kendall, MD;  [facility] INVASIVE CV LAB;  Service: Cardiovascular;  Laterality: N/A;   PARTIAL KNEE ARTHROPLASTY Left 11/12/2017   Procedure: left unicompartmental arthroplasty-medial;  Surgeon: Durene Romans, MD;  Location: [facility] ORS;  Service: Orthopedics;  Laterality: Left;    RIGHT HEART CATH AND CORONARY/GRAFT ANGIOGRAPHY N/A 06/26/2022   Procedure: RIGHT HEART CATH AND CORONARY/GRAFT ANGIOGRAPHY;  Surgeon: [name], MD;  [facility]: [facility] INVASIVE CV LAB;  Service: Cardiovascular;  Laterality: N/A;   SUBMUCOSAL INJECTION  09/23/2017   Procedure: SUBMUCOSAL INJECTION;  Surgeon: Graylin Shiver, MD;  Location: [facility];  Service: Endoscopy;;  in colon   TEE WITHOUT CARDIOVERSION N/A 09/13/2020   Procedure: TRANSESOPHAGEAL ECHOCARDIOGRAM (TEE);  Surgeon: Alleen Borne, MD;  Location: [facility] OR;   Service: Open Heart Surgery;  Laterality: N/A;   TUBAL LIGATION  1990    Family History  Problem Relation Age of Onset   Lung cancer Mother    Diabetes Sister    Heart disease Sister    Asthma Daughter    Arthritis Daughter    Arthritis Daughter    Thyroid cancer Other        1/2 sister   Heart disease Other        1/2 sister    Social History   Socioeconomic History   Marital status: Divorced    Spouse name: BASIL   Number of children: 3   Years of education: 1   Highest education level: GED or equivalent  Occupational History   Occupation: retired Public house manager  Tobacco Use   Smoking status: Former    Current packs/day: 0.00    Average packs/day: 0.5 packs/day for 40.0 years (20.0 ttl pk-yrs)    Types: Cigarettes    Start date: 08/1980    Quit date: 08/2020    Years since quitting: 2.0   Smokeless tobacco: Never  Vaping Use   Vaping status: Never Used  Substance and Sexual Activity   Alcohol use: Yes    Comment: occ   Drug use: No   Sexual activity: Not on file  Other Topics Concern   Not on file  Social History Narrative   Not on file   Social Determinants of Health   Financial Resource Strain: Low Risk  (06/12/2022)   Received from [facility], [facility]   Overall Financial Resource Strain (CARDIA)    Difficulty of Paying Living Expenses: Not hard at all  Recent Concern: Financial Resource Strain - Medium Risk (04/29/2022)   Received from [facility]   Overall Financial Resource Strain (CARDIA)    Difficulty of Paying Living Expenses: Somewhat hard  Food Insecurity: No Food Insecurity (06/12/2022)   Received from [facility], [facility]   Hunger Vital Sign    Worried About Running Out of Food in the Last Year: Never true    Ran Out of Food in the Last Year: Never true  Transportation Needs: No Transportation Needs (07/07/2022)   Received from [facility], [facility]   PRAPARE - Transportation    Lack of Transportation (Medical): No    Lack of  Transportation (Non-Medical): No  Physical Activity: Inactive (09/15/2018)   Exercise Vital Sign    Days of Exercise per Week: 0 days    Minutes of Exercise per Session: 0 min  Stress: Stress Concern Present (07/02/2022)   Received from [facility], [facility] Occupational Health - Occupational Stress Questionnaire    Feeling of Stress : To some extent  Social Connections: Unknown (06/23/2021)   [facility]   Social Network    Social Network: Not on file  Intimate Partner Violence: Not At Risk (05/28/2022)   Received from [facility], [facility]   HITS    Over the last 12 months how often did your partner physically hurt you?: 1    Over the last 12 months how often did your partner insult you or talk down to you?: 1    Over the last 12 months how often did your partner threaten you with physical harm?: 1    Over the last 12 months how often did your partner scream or curse at you?: 1     Prior to Admission medications   Medication Sig Start Date End Date Taking? Authorizing Provider  acetaminophen (TYLENOL) 500 MG tablet Take 1,000 mg by mouth daily as needed (pain).    [provider]  albuterol (PROVENTIL) (2.5 MG/3ML) 0.083% nebulizer solution Take 3 mLs (2.5 mg total) by nebulization every 6 (six) hours as needed for wheezing or shortness of breath. 07/10/21   Hunsucker, Lesia Sago, MD  allopurinol (ZYLOPRIM) 100 MG tablet Take 100 mg by mouth every evening.    [provider]  carboxymethylcellulose 1 % ophthalmic solution Place 2 drops into both eyes 2 (two) times daily as needed (dry eyes).    [provider]  carvedilol (COREG) 25 MG tablet Take 25 mg by mouth 2 (two) times daily with a meal.    [provider]  cetirizine (ZYRTEC) 10 MG tablet Take 10 mg by mouth daily as needed for allergies.    [provider]  cholecalciferol (VITAMIN D) 25 MCG tablet Take 1 tablet (1,000  Units total) by mouth every evening. Patient taking differently: Take 1,000 Units by mouth See admin instructions. Take one tablet by mouth on Monday Wednesdays and Fridays 10/04/20   Gershon Crane E, PA-C  cholecalciferol (VITAMIN D3) 10 MCG (400 UNIT) TABS tablet Take 800 Units by mouth daily.    [provider]  cinacalcet (SENSIPAR) 60 MG tablet Take 60 mg by mouth every evening.    [provider]  cloNIDine (CATAPRES - DOSED IN MG/24 HR) 0.3 mg/24hr patch 0.3 mg once a week.    [provider]  cyclobenzaprine (FLEXERIL) 10 MG tablet Take 10 mg by mouth 2 (two) times daily as needed for muscle spasms.    [provider]  Darbepoetin Alfa (ARANESP) 200 MCG/0.4ML SOSY injection Inject 0.4 mLs (200 mcg total) into the vein every Wednesday with hemodialysis. 10/10/20   Gershon Crane E, PA-C  diclofenac Sodium (VOLTAREN) 1 % GEL Apply 2 g topically daily as needed (pain).    [provider]  docusate sodium (COLACE) 100 MG capsule Take 100 mg by mouth daily as needed for mild constipation.    [provider]  EPINEPHrine 0.3 mg/0.3 mL IJ SOAJ injection Inject into the muscle as needed for anaphylaxis. 07/12/21   [provider]  esomeprazole (NEXIUM) 20 MG capsule Take by mouth as needed (heartburn). 05/21/19   [provider]  ferrous sulfate 325 (65 FE) MG tablet Take 325 mg by mouth See admin instructions. Take 1 tablet by mouth three times a week on Tues, Thur, Sat only per patient    [provider]  folic acid (FOLVITE) 400 MCG tablet Take 400 mcg by mouth daily.    [provider]  heparin 1000 unit/mL SOLN injection 1 mL (1,000 Units total) by Dialysis route as needed (in dialysis).  10/04/20   Gershon Crane E, PA-C  hydrALAZINE (APRESOLINE) 100 MG tablet Take 1 tablet (100 mg total) by mouth every 8 (eight) hours. Patient taking differently: Take 100 mg by mouth 3 (three) times daily. 07/06/20   Cresenzo, Cyndi Lennert, MD   hydrocortisone (ANUSOL-HC) 2.5 % rectal cream Place rectally 2 (two) times daily. Patient taking differently: Place 1 Application rectally daily as needed for hemorrhoids or anal itching. 07/23/21   Park Pope, MD  isosorbide mononitrate (IMDUR) 60 MG 24 hr tablet Take 1 tablet (60 mg total) by mouth daily. 08/14/21   Bhagat, Bhavinkumar, PA  LORazepam (ATIVAN) 1 MG tablet Take 1 mg by mouth every 8 (eight) hours as needed for anxiety. 06/25/20   [provider]  montelukast (SINGULAIR) 10 MG tablet Take 10 mg by mouth daily with supper.     [provider]  NIFEdipine (PROCARDIA XL/NIFEDICAL XL) 60 MG 24 hr tablet Take 60 mg by mouth 2 (two) times daily. 10/01/21   [provider]  olmesartan (BENICAR) 40 MG tablet Take 40 mg by mouth every evening. 05/06/19   [provider]  ondansetron (ZOFRAN) 4 MG tablet Take 4 mg by mouth 2 (two) times daily as needed for nausea or vomiting.    [provider]  OVER THE COUNTER MEDICATION Take by mouth daily as needed (sickness). Elderberry Fruit and flower 1 dropper full by mouth daily as needed for sickness    [provider]  Oxycodone HCl 10 MG TABS Take 10 mg by mouth daily as needed for pain. 01/11/21   [provider]  OXYGEN Inhale 2 L into the lungs as directed.    [provider]  pantoprazole (PROTONIX) 40 MG tablet Take 40 mg by mouth daily. 06/09/20   [provider]  predniSONE (DELTASONE) 5 MG tablet Take 1 tablet (5 mg total) by mouth daily with breakfast. 02/27/22   Marrianne Mood, MD  Probiotic Product (PROBIOTIC PO) Take 1 capsule by mouth daily.    [provider]  sorbitol 70 % SOLN Take 30 ml 1-2 times daily as needed for constipation 07/23/21   Park Pope, MD  Tiotropium Bromide-Olodaterol (STIOLTO RESPIMAT) 2.5-2.5 MCG/ACT AERS Inhale 2 puffs once daily into the lungs. 11/28/21   Hunsucker, Lesia Sago, MD  VENTOLIN HFA 108 (90 Base) MCG/ACT inhaler  Inhale 2 puffs into the lungs See admin instructions. Take 2 puffs every 4-6 hours as needed for wheezing and shortness of breath 04/08/22   Hunsucker, Lesia Sago, MD  zolpidem (AMBIEN) 5 MG tablet Take 5 mg by mouth at bedtime as needed for sleep. 02/04/21   [provider]    Allergies  Allergen Reactions   3-Methyl-2-Benzothiazolinone Hydrazone Other (See Comments)    Muscle weakness muscle cramping in legs Other reaction(s): Myalgias (Muscle Pain)   Banana Anaphylaxis, Swelling and Other (See Comments)    TONGUE AND MOUTH SWELLING   Black Walnut Flavor Anaphylaxis and Itching    Walnuts   Hazelnut (Filbert) Anaphylaxis    Hazelnuts   Leflunomide And Related Other (See Comments)    Severe Headache   Lisinopril Swelling    Angioedema  Swelling of the face   No Healthtouch Food Allergies Anaphylaxis    Spicy Mustard 4.7.2021 Pt reports that she eats Regular Yellow Mustard Swelling and itching of tongue   Other Other (See Comments), Anaphylaxis and Swelling    Cosentyx= angioedema  Other reaction(s): Facial Edema (intolerance) Mouth itches and tongue swelling Hazel nuts and pecans  also Walnuts Walnuts Renal failure Other reaction(s): Other Serotonin Syndrome  Serotonin syndrome  Tremors Other reaction(s): Other Tremors Muscle weakness muscle cramping in legs Other reaction(s): Myalgias (Muscle Pain) Mouth itches and tongue swelling Hazel nuts and pecans also Walnuts    Pecan Nut (Diagnostic) Anaphylaxis and Itching    Pecans   Trazodone And Nefazodone Other (See Comments)    Serotonin Syndrome  Tremors   Adalimumab Other (See Comments)    Blisters on abdomen =humira   Escitalopram Oxalate Other (See Comments)    Hand problems - Serotonin Syndrome     Ezetimibe Other (See Comments)    myalgias cramps    Secukinumab Swelling    Cosentyx = angiodema    Statins Other (See Comments)    Leg pains and weakness   Ferrlecit [Na Ferric Gluc Cplx In  Sucrose]     Not reaction given from HD   Gabapentin Other (See Comments)    tremors   Nsaids     Avoid due to renal problems     REVIEW OF SYSTEMS:  General: no fevers/chills/night sweats Eyes: no blurry vision, diplopia, or amaurosis ENT: no sore throat or hearing loss Resp: no cough, wheezing, or hemoptysis CV: no edema or palpitations GI: no abdominal pain, nausea, vomiting, diarrhea, or constipation GU: no dysuria, frequency, or hematuria Skin: no rash Neuro: no headache, numbness, tingling, or weakness of extremities Musculoskeletal: no joint pain or swelling Heme: no bleeding, DVT, or easy bruising Endo: no polydipsia or polyuria  BP 138/62   Pulse 73   Ht 5\' 2"  (1.575 m)   Wt 134 lb 6.4 oz (61 kg)   SpO2 94%   BMI 24.58 kg/m   PHYSICAL EXAM: GEN:  AO x 3 in no acute distress HEENT: normal Dentition: Poor Neck: JVP normal. +2 carotid upstrokes without bruits. No thyromegaly. Lungs: equal expansion, clear bilaterally CV: Apex is discrete and nondisplaced, RRR with 3/6 SEM Abd: soft, non-tender, non-distended; no bruit; positive bowel sounds Ext: no edema, ecchymoses, or cyanosis Vascular: 2+ femoral pulses, 2+ radial pulses       Skin: warm and dry without rash Neuro: CN II-XII grossly intact; motor and sensory grossly intact    DATA AND STUDIES:  EKG:  Sinus rhythm with left ventricular hypertrophy  2D ECHO: [facility] 07/2021  1. Left ventricular ejection fraction, by estimation, is 65 to 70%. The  left ventricle has normal function. The left ventricle has no regional  wall motion abnormalities. There is severe concentric left ventricular  hypertrophy. Left ventricular diastolic   parameters are consistent with Grade II diastolic dysfunction  (pseudonormalization). Elevated left atrial pressure.   2. Right ventricular systolic function is mildly reduced. The right  ventricular size is normal. There is moderately elevated pulmonary artery  systolic  pressure. The estimated right ventricular systolic pressure is  55.2 mmHg which is higher than previous   TTE on 06/26/21 at . May be related to volume overload, however, PE  is on the differential. Recommend clinical correlation.   3. Left atrial size was severely dilated.   4. Right atrial size was mildly dilated.   5. The mitral valve is degenerative. Mild mitral valve regurgitation.  Severe mitral annular calcification.   6. Tricuspid valve regurgitation is mild to moderate.   7. The aortic valve is tricuspid. There is moderate calcification of the  aortic valve. There is moderate thickening of the aortic valve. Aortic  valve regurgitation is not visualized. Moderate aortic valve stenosis.  Aortic  valve mean gradient measures  36.0 mmHg. Aortic valve Vmax measures 4.11 m/s. AVA by continuity 1.4cm2,  DI 0.59m/s. Suspect elevated mean gradient and Vmax in the setting of high  output with LVOT VTI 40.4cm.   8. Aortic dilatation noted. There is borderline dilatation of the  ascending aorta, measuring 39 mm.   9. The inferior vena cava is dilated in size with <50% respiratory  variability, suggesting right atrial pressure of 15 mmHg.   CARDIAC CATH: n/a  STS RISK CALCULATOR: pending  NHYA CLASS: 2    ASSESSMENT AND PLAN:   Aortic valve stenosis, etiology of cardiac valve disease unspecified - Plan: ECHOCARDIOGRAM COMPLETE  Type 2 diabetes mellitus with complication, without long-term current use of insulin (HCC)  Hypertension associated with diabetes (HCC)  Hyperlipidemia associated with type 2 diabetes mellitus (HCC)  ESRD (end stage renal disease) (HCC)  S/P CABG x 2  I had a conversation with the patient and her daughter.  I did show them the annular calcification pattern associated with her CT scan.  I did tell her there is a risk of annular rupture and paravalvular leak.  I told her that it probably makes sense to wait until she is quite symptomatic or if she is  unable to tolerate full dialysis sessions.  She agrees with this assessment.  She feels relatively well and would like to monitor symptomatology.  I told her to reach out to [place] if she develops any presyncope syncope worsening shortness of breath or chest pain.  Additionally if her dialysis sessions are interrupted due to low blood pressures this would signal the need to consider an aortic valve intervention.  I will see her back in 6 months with an echocardiogram.  She knows to contact our office in the interim if anything were to develop clinically.  Total time spent with patient today 60 minutes. This includes reviewing records, evaluating the patient and coordinating care.   [name], MD  08/21/2022 2:21 PM    [facility] [address] [place], [place], [place]  [address] Phone: [phone]; Fax: [phone]

## 2022-08-21 ENCOUNTER — Encounter: Payer: Self-pay | Admitting: Internal Medicine

## 2022-08-21 ENCOUNTER — Ambulatory Visit: Payer: Medicare PPO | Attending: Internal Medicine | Admitting: Internal Medicine

## 2022-08-21 VITALS — BP 138/62 | HR 73 | Ht 62.0 in | Wt 134.4 lb

## 2022-08-21 DIAGNOSIS — I152 Hypertension secondary to endocrine disorders: Secondary | ICD-10-CM

## 2022-08-21 DIAGNOSIS — I350 Nonrheumatic aortic (valve) stenosis: Secondary | ICD-10-CM

## 2022-08-21 DIAGNOSIS — E118 Type 2 diabetes mellitus with unspecified complications: Secondary | ICD-10-CM

## 2022-08-21 DIAGNOSIS — E1169 Type 2 diabetes mellitus with other specified complication: Secondary | ICD-10-CM

## 2022-08-21 DIAGNOSIS — E785 Hyperlipidemia, unspecified: Secondary | ICD-10-CM

## 2022-08-21 DIAGNOSIS — Z951 Presence of aortocoronary bypass graft: Secondary | ICD-10-CM

## 2022-08-21 DIAGNOSIS — E1159 Type 2 diabetes mellitus with other circulatory complications: Secondary | ICD-10-CM

## 2022-08-21 DIAGNOSIS — N186 End stage renal disease: Secondary | ICD-10-CM

## 2022-08-21 NOTE — Patient Instructions (Signed)
Medication Instructions:  No changes *If you need a refill on your cardiac medications before your next appointment, please call your pharmacy*   Lab Work: none   Testing/Procedures: ECHO DUE JAN 2025 Your physician has requested that you have an echocardiogram. Echocardiography is a painless test that uses sound waves to create images of your heart. It provides your doctor with information about the size and shape of your heart and how well your heart's chambers and valves are working. This procedure takes approximately one hour. There are no restrictions for this procedure. Please do NOT wear cologne, perfume, aftershave, or lotions (deodorant is allowed). Please arrive 15 minutes prior to your appointment time.   Follow-Up: At [facility], you and your health needs are our priority.  As part of our continuing mission to provide you with exceptional heart care, we have created designated Provider Care Teams.  These Care Teams include your primary Cardiologist (physician) and Advanced Practice Providers (APPs -  Physician Assistants and Nurse Practitioners) who all work together to provide you with the care you need, when you need it.   Your next appointment:   6 month(s)  Provider:   Alverda Skeans, MD

## 2022-08-25 ENCOUNTER — Encounter: Payer: Self-pay | Admitting: Pulmonary Disease

## 2022-08-26 ENCOUNTER — Encounter: Payer: Self-pay | Admitting: Internal Medicine

## 2022-08-26 NOTE — Telephone Encounter (Signed)
Ok to use last visit assuming she has not had respiratory illness in the interim. Thanks!

## 2022-09-16 ENCOUNTER — Telehealth: Payer: Self-pay | Admitting: Pulmonary Disease

## 2022-09-16 NOTE — Telephone Encounter (Signed)
Re-sent Surgical clearance to [facility]. Closing encounter. NFn

## 2022-09-16 NOTE — Telephone Encounter (Signed)
Jodi Bell states has not received surgery clearance forms. [name] phone number is [phone]. Fax number is [phone].

## 2022-09-17 NOTE — Telephone Encounter (Signed)
Patient and son came to office yesterday to pick up a copy of the surgical clearance note. Will close encounter.

## 2022-09-18 NOTE — Telephone Encounter (Signed)
Surgical clearance has been received. Closing encounter. NFN

## 2022-09-19 ENCOUNTER — Other Ambulatory Visit: Payer: Self-pay

## 2022-09-19 ENCOUNTER — Encounter (HOSPITAL_COMMUNITY): Payer: Self-pay | Admitting: *Deleted

## 2022-09-19 ENCOUNTER — Emergency Department (HOSPITAL_COMMUNITY): Payer: Medicare PPO

## 2022-09-19 ENCOUNTER — Emergency Department (HOSPITAL_BASED_OUTPATIENT_CLINIC_OR_DEPARTMENT_OTHER): Payer: Medicare PPO

## 2022-09-19 ENCOUNTER — Encounter (HOSPITAL_BASED_OUTPATIENT_CLINIC_OR_DEPARTMENT_OTHER): Payer: Self-pay | Admitting: Emergency Medicine

## 2022-09-19 ENCOUNTER — Emergency Department (HOSPITAL_BASED_OUTPATIENT_CLINIC_OR_DEPARTMENT_OTHER)
Admission: EM | Admit: 2022-09-19 | Discharge: 2022-09-19 | Disposition: A | Discharge status: Home / Self Care | Payer: Medicare PPO | Source: Home / Self Care | Attending: Emergency Medicine | Admitting: Emergency Medicine

## 2022-09-19 ENCOUNTER — Inpatient Hospital Stay (HOSPITAL_COMMUNITY)
Admission: EM | Admit: 2022-09-19 | Discharge: 2022-09-23 | DRG: 637 | Disposition: A | Discharge status: Home Health | Payer: Medicare PPO | Attending: Internal Medicine | Admitting: Internal Medicine

## 2022-09-19 DIAGNOSIS — I2510 Atherosclerotic heart disease of native coronary artery without angina pectoris: Secondary | ICD-10-CM | POA: Diagnosis present

## 2022-09-19 DIAGNOSIS — D696 Thrombocytopenia, unspecified: Secondary | ICD-10-CM

## 2022-09-19 DIAGNOSIS — Z79899 Other long term (current) drug therapy: Secondary | ICD-10-CM | POA: Insufficient documentation

## 2022-09-19 DIAGNOSIS — Z886 Allergy status to analgesic agent status: Secondary | ICD-10-CM

## 2022-09-19 DIAGNOSIS — G928 Other toxic encephalopathy: Secondary | ICD-10-CM | POA: Diagnosis present

## 2022-09-19 DIAGNOSIS — F5104 Psychophysiologic insomnia: Secondary | ICD-10-CM | POA: Diagnosis present

## 2022-09-19 DIAGNOSIS — N2581 Secondary hyperparathyroidism of renal origin: Secondary | ICD-10-CM | POA: Diagnosis present

## 2022-09-19 DIAGNOSIS — E162 Hypoglycemia, unspecified: Secondary | ICD-10-CM

## 2022-09-19 DIAGNOSIS — G9341 Metabolic encephalopathy: Secondary | ICD-10-CM | POA: Diagnosis not present

## 2022-09-19 DIAGNOSIS — R110 Nausea: Secondary | ICD-10-CM | POA: Insufficient documentation

## 2022-09-19 DIAGNOSIS — Z992 Dependence on renal dialysis: Secondary | ICD-10-CM | POA: Insufficient documentation

## 2022-09-19 DIAGNOSIS — I10 Essential (primary) hypertension: Secondary | ICD-10-CM | POA: Diagnosis present

## 2022-09-19 DIAGNOSIS — Z7982 Long term (current) use of aspirin: Secondary | ICD-10-CM | POA: Insufficient documentation

## 2022-09-19 DIAGNOSIS — Z87891 Personal history of nicotine dependence: Secondary | ICD-10-CM

## 2022-09-19 DIAGNOSIS — Z981 Arthrodesis status: Secondary | ICD-10-CM

## 2022-09-19 DIAGNOSIS — N186 End stage renal disease: Secondary | ICD-10-CM

## 2022-09-19 DIAGNOSIS — Z833 Family history of diabetes mellitus: Secondary | ICD-10-CM

## 2022-09-19 DIAGNOSIS — J438 Other emphysema: Secondary | ICD-10-CM

## 2022-09-19 DIAGNOSIS — R188 Other ascites: Secondary | ICD-10-CM | POA: Insufficient documentation

## 2022-09-19 DIAGNOSIS — J4489 Other specified chronic obstructive pulmonary disease: Secondary | ICD-10-CM | POA: Diagnosis present

## 2022-09-19 DIAGNOSIS — Z91018 Allergy to other foods: Secondary | ICD-10-CM

## 2022-09-19 DIAGNOSIS — Z7952 Long term (current) use of systemic steroids: Secondary | ICD-10-CM

## 2022-09-19 DIAGNOSIS — R4182 Altered mental status, unspecified: Principal | ICD-10-CM

## 2022-09-19 DIAGNOSIS — E1122 Type 2 diabetes mellitus with diabetic chronic kidney disease: Secondary | ICD-10-CM | POA: Diagnosis present

## 2022-09-19 DIAGNOSIS — M109 Gout, unspecified: Secondary | ICD-10-CM | POA: Diagnosis present

## 2022-09-19 DIAGNOSIS — I5032 Chronic diastolic (congestive) heart failure: Secondary | ICD-10-CM

## 2022-09-19 DIAGNOSIS — R030 Elevated blood-pressure reading, without diagnosis of hypertension: Secondary | ICD-10-CM | POA: Insufficient documentation

## 2022-09-19 DIAGNOSIS — Z1152 Encounter for screening for COVID-19: Secondary | ICD-10-CM

## 2022-09-19 DIAGNOSIS — K219 Gastro-esophageal reflux disease without esophagitis: Secondary | ICD-10-CM | POA: Diagnosis present

## 2022-09-19 DIAGNOSIS — Z720 Tobacco use: Secondary | ICD-10-CM | POA: Diagnosis present

## 2022-09-19 DIAGNOSIS — R0602 Shortness of breath: Secondary | ICD-10-CM | POA: Insufficient documentation

## 2022-09-19 DIAGNOSIS — R5381 Other malaise: Secondary | ICD-10-CM | POA: Diagnosis present

## 2022-09-19 DIAGNOSIS — Z96652 Presence of left artificial knee joint: Secondary | ICD-10-CM | POA: Diagnosis present

## 2022-09-19 DIAGNOSIS — Z888 Allergy status to other drugs, medicaments and biological substances status: Secondary | ICD-10-CM

## 2022-09-19 DIAGNOSIS — G8929 Other chronic pain: Secondary | ICD-10-CM | POA: Diagnosis present

## 2022-09-19 DIAGNOSIS — I4891 Unspecified atrial fibrillation: Secondary | ICD-10-CM | POA: Diagnosis present

## 2022-09-19 DIAGNOSIS — T380X5A Adverse effect of glucocorticoids and synthetic analogues, initial encounter: Secondary | ICD-10-CM | POA: Diagnosis present

## 2022-09-19 DIAGNOSIS — Z794 Long term (current) use of insulin: Secondary | ICD-10-CM

## 2022-09-19 DIAGNOSIS — I5033 Acute on chronic diastolic (congestive) heart failure: Secondary | ICD-10-CM | POA: Diagnosis present

## 2022-09-19 DIAGNOSIS — E1151 Type 2 diabetes mellitus with diabetic peripheral angiopathy without gangrene: Secondary | ICD-10-CM | POA: Diagnosis present

## 2022-09-19 DIAGNOSIS — J449 Chronic obstructive pulmonary disease, unspecified: Secondary | ICD-10-CM | POA: Diagnosis present

## 2022-09-19 DIAGNOSIS — E11649 Type 2 diabetes mellitus with hypoglycemia without coma: Secondary | ICD-10-CM | POA: Diagnosis not present

## 2022-09-19 DIAGNOSIS — R1084 Generalized abdominal pain: Secondary | ICD-10-CM

## 2022-09-19 DIAGNOSIS — J9611 Chronic respiratory failure with hypoxia: Secondary | ICD-10-CM

## 2022-09-19 DIAGNOSIS — I480 Paroxysmal atrial fibrillation: Secondary | ICD-10-CM

## 2022-09-19 DIAGNOSIS — G4733 Obstructive sleep apnea (adult) (pediatric): Secondary | ICD-10-CM | POA: Diagnosis present

## 2022-09-19 DIAGNOSIS — Z951 Presence of aortocoronary bypass graft: Secondary | ICD-10-CM

## 2022-09-19 DIAGNOSIS — Z8249 Family history of ischemic heart disease and other diseases of the circulatory system: Secondary | ICD-10-CM

## 2022-09-19 DIAGNOSIS — F419 Anxiety disorder, unspecified: Secondary | ICD-10-CM | POA: Diagnosis present

## 2022-09-19 DIAGNOSIS — L4050 Arthropathic psoriasis, unspecified: Secondary | ICD-10-CM | POA: Diagnosis present

## 2022-09-19 DIAGNOSIS — I132 Hypertensive heart and chronic kidney disease with heart failure and with stage 5 chronic kidney disease, or end stage renal disease: Secondary | ICD-10-CM | POA: Diagnosis present

## 2022-09-19 DIAGNOSIS — Z825 Family history of asthma and other chronic lower respiratory diseases: Secondary | ICD-10-CM

## 2022-09-19 LAB — AMMONIA: Ammonia: 32 umol/L (ref 9–35)

## 2022-09-19 LAB — CBC
HCT: 42.8 % (ref 36.0–46.0)
HCT: 40.7 % (ref 36.0–46.0)
Hemoglobin: 12.8 g/dL (ref 12.0–15.0)
Hemoglobin: 11.9 g/dL — ABNORMAL LOW (ref 12.0–15.0)
MCH: 31.6 pg (ref 26.0–34.0)
MCH: 32.2 pg (ref 26.0–34.0)
MCHC: 29.9 g/dL — ABNORMAL LOW (ref 30.0–36.0)
MCHC: 29.2 g/dL — ABNORMAL LOW (ref 30.0–36.0)
MCV: 105.7 fL — ABNORMAL HIGH (ref 80.0–100.0)
MCV: 110.3 fL — ABNORMAL HIGH (ref 80.0–100.0)
Platelets: 129 10*3/uL — ABNORMAL LOW (ref 150–400)
Platelets: 121 10*3/uL — ABNORMAL LOW (ref 150–400)
RBC: 4.05 MIL/uL (ref 3.87–5.11)
RBC: 3.69 MIL/uL — ABNORMAL LOW (ref 3.87–5.11)
RDW: 15.4 % (ref 11.5–15.5)
RDW: 15.3 % (ref 11.5–15.5)
WBC: 3.7 10*3/uL — ABNORMAL LOW (ref 4.0–10.5)
WBC: 4.4 10*3/uL (ref 4.0–10.5)
nRBC: 0 % (ref 0.0–0.2)
nRBC: 0 % (ref 0.0–0.2)

## 2022-09-19 LAB — BASIC METABOLIC PANEL
Anion gap: 10 (ref 5–15)
BUN: 22 mg/dL (ref 8–23)
CO2: 24 mmol/L (ref 22–32)
Calcium: 8.7 mg/dL — ABNORMAL LOW (ref 8.9–10.3)
Chloride: 100 mmol/L (ref 98–111)
Creatinine, Ser: 5.15 mg/dL — ABNORMAL HIGH (ref 0.44–1.00)
GFR, Estimated: 9 mL/min — ABNORMAL LOW (ref >60)
Glucose, Bld: 61 mg/dL — ABNORMAL LOW (ref 70–99)
Potassium: 4 mmol/L (ref 3.5–5.1)
Sodium: 134 mmol/L — ABNORMAL LOW (ref 135–145)

## 2022-09-19 LAB — CBG MONITORING, ED
Glucose-Capillary: 103 mg/dL — ABNORMAL HIGH (ref 70–99)
Glucose-Capillary: 127 mg/dL — ABNORMAL HIGH (ref 70–99)
Glucose-Capillary: 155 mg/dL — ABNORMAL HIGH (ref 70–99)
Glucose-Capillary: 59 mg/dL — ABNORMAL LOW (ref 70–99)
Glucose-Capillary: 72 mg/dL (ref 70–99)
Glucose-Capillary: 93 mg/dL (ref 70–99)

## 2022-09-19 LAB — COMPREHENSIVE METABOLIC PANEL
ALT: 5 U/L (ref 0–44)
AST: 16 U/L (ref 15–41)
Albumin: 4 g/dL (ref 3.5–5.0)
Alkaline Phosphatase: 130 U/L — ABNORMAL HIGH (ref 38–126)
Anion gap: 13 (ref 5–15)
BUN: 33 mg/dL — ABNORMAL HIGH (ref 8–23)
CO2: 25 mmol/L (ref 22–32)
Calcium: 9.5 mg/dL (ref 8.9–10.3)
Chloride: 99 mmol/L (ref 98–111)
Creatinine, Ser: 6.53 mg/dL — ABNORMAL HIGH (ref 0.44–1.00)
GFR, Estimated: 7 mL/min — ABNORMAL LOW (ref >60)
Glucose, Bld: 41 mg/dL — CL (ref 70–99)
Potassium: 4.2 mmol/L (ref 3.5–5.1)
Sodium: 137 mmol/L (ref 135–145)
Total Bilirubin: 0.4 mg/dL (ref 0.3–1.2)
Total Protein: 6.9 g/dL (ref 6.5–8.1)

## 2022-09-19 LAB — LIPASE, BLOOD: Lipase: 28 U/L (ref 11–51)

## 2022-09-19 LAB — I-STAT CG4 LACTIC ACID, ED: Lactic Acid, Venous: 0.4 mmol/L — ABNORMAL LOW (ref 0.5–1.9)

## 2022-09-19 MED ORDER — DEXTROSE 50 % IV SOLN
25.0000 g | Freq: Once | INTRAVENOUS | Status: AC
  Administered 2022-09-19: 25 g via INTRAVENOUS
  Filled 2022-09-19: qty 50

## 2022-09-19 MED ORDER — PANTOPRAZOLE SODIUM 40 MG IV SOLR
40.0000 mg | Freq: Once | INTRAVENOUS | Status: AC
  Administered 2022-09-19: 40 mg via INTRAVENOUS
  Filled 2022-09-19: qty 10

## 2022-09-19 MED ORDER — IOHEXOL 350 MG/ML SOLN: 75.0000 mL | Freq: Once | INTRAVENOUS | Status: DC | PRN

## 2022-09-19 MED ORDER — DEXTROSE 50 % IV SOLN: 1.0000 | Freq: Once | INTRAVENOUS | Status: AC

## 2022-09-19 MED ORDER — MORPHINE SULFATE (PF) 2 MG/ML IV SOLN
2.0000 mg | Freq: Once | INTRAVENOUS | Status: AC
  Administered 2022-09-19: 2 mg via INTRAVENOUS
  Filled 2022-09-19: qty 1

## 2022-09-19 MED ORDER — ONDANSETRON HCL 4 MG/2ML IJ SOLN
4.0000 mg | Freq: Once | INTRAMUSCULAR | Status: AC
  Administered 2022-09-19: 4 mg via INTRAVENOUS
  Filled 2022-09-19: qty 2

## 2022-09-19 MED ORDER — IOHEXOL 300 MG/ML  SOLN
75.0000 mL | Freq: Once | INTRAMUSCULAR | Status: AC | PRN
  Administered 2022-09-19: 75 mL via INTRAVENOUS

## 2022-09-19 MED ORDER — DEXTROSE 50 % IV SOLN
INTRAVENOUS | Status: AC
  Administered 2022-09-19: 50 mL via INTRAVENOUS
  Filled 2022-09-19: qty 50

## 2022-09-19 MED ORDER — INSULIN ASPART 100 UNIT/ML IJ SOLN: 0.0000 [IU] | INTRAMUSCULAR | Status: DC

## 2022-09-19 NOTE — ED Notes (Signed)
Pt's CBG=127

## 2022-09-19 NOTE — Discharge Instructions (Addendum)
It was our pleasure to provide your ER care today - we hope that you feel better.  Go to your dialysis center today/now for dialysis - we contacted your dialysis center and they indicate they can do your dialysis now.   Follow up closely with primary care doctor this coming week for recheck. Also have your blood pressure rechecked then as it is high today.   Return to ER right away if worse, new symptoms, fevers, new or worsening or severe abdominal pain, persistent vomiting, chest pain, increased trouble breathing, or other concern.

## 2022-09-19 NOTE — ED Notes (Signed)
Received TC from lab regarding glucose of 41.  Dr. Denton Lank notified

## 2022-09-19 NOTE — ED Notes (Signed)
Daughter states that her mom could have probably ingested some ativan and percocet/oxy on Wed evening

## 2022-09-19 NOTE — ED Notes (Signed)
PA in triage at bedside.

## 2022-09-19 NOTE — ED Notes (Signed)
Pt is anuric at baseline, especially on dialysis.

## 2022-09-19 NOTE — ED Triage Notes (Addendum)
Pt states she started having some abdominal pain yesterday, some Nausea, no V/D.Marland Kitchen  Started with SOB this am.  Pt is dialysis patient, last treatment was Wednesday.  Pt states she feels puffy.  No chest pain currently but had some last night.  Pt is on O2 at home 2-3L/per minute.  Pt states her portable O2 tank did not charge last night so when she arrived today, her sats were in the 70's.  Pt placed on O2 at 6L  initially, but was able to wean to 2L once saturation returned to 90's.  Denies fever or cough.

## 2022-09-19 NOTE — ED Triage Notes (Signed)
The pt is dialysis pt that had only 2 hours of dialysis today  she was seen at med center this am by one of the ed doctors  she is not acting normal and the son with her reports that his sister was with her this am but the sister had to go out of town  cbg was low earlier today

## 2022-09-19 NOTE — ED Provider Notes (Signed)
[facility] Provider Note   CSN: [id number] Arrival date & time: 09/19/22  0805     History  Chief Complaint  Patient presents with   Abdominal Pain    Jodi Bell is a 62 y.o. female.  Pt c/o abd pain in the past couple days, mid/diffuse abd. Notes hx similar pain, not sure of cause. Hx esrd/hd, mwf, but did not go today, did go Wednesday. No chest pain or sob. Uses home o2 on chronic basis and indicates has adequate supply. No new cough, no fevers. Denies dysuria. +nausea. No vomiting or diarrhea. No fever or chills.   The history is provided by the patient and medical records. The history is limited by the condition of the patient.  Shortness of Breath Associated symptoms: abdominal pain   Associated symptoms: no chest pain, no cough, no fever, no headaches, no rash, no sore throat and no vomiting   Abdominal Pain Associated symptoms: nausea and shortness of breath   Associated symptoms: no chest pain, no chills, no cough, no dysuria, no fever, no sore throat and no vomiting        Home Medications Prior to Admission medications   Medication Sig Start Date End Date Taking? Authorizing Provider  acetaminophen (TYLENOL) 500 MG tablet Take 1,000 mg by mouth daily as needed (pain).    [provider]  albuterol (PROVENTIL) (2.5 MG/3ML) 0.083% nebulizer solution Take 3 mLs (2.5 mg total) by nebulization every 6 (six) hours as needed for wheezing or shortness of breath. 07/10/21   Hunsucker, Lesia Sago, MD  allopurinol (ZYLOPRIM) 100 MG tablet Take 100 mg by mouth every evening.    [provider]  aspirin EC 81 MG tablet Take 81 mg by mouth daily as needed (CAd). Swallow whole.    [provider]  B Complex-C (SUPER B COMPLEX PO) Take 1 tablet by mouth 4 (four) times a week. Take on Sun, Tues, Thurs, and Sat    [provider]  Camphor-Eucalyptus-Menthol (VICKS VAPORUB EX) Apply 1 Application  topically at bedtime as needed (congestion). Patient not taking: Reported on 08/21/2022    [provider]  carboxymethylcellulose 1 % ophthalmic solution Place 1 drop into both eyes 3 (three) times daily.    [provider]  carvedilol (COREG) 25 MG tablet Take 25 mg by mouth 2 (two) times daily with a meal.    [provider]  cetirizine (ZYRTEC) 10 MG tablet Take 10 mg by mouth daily as needed for allergies.    [provider]  Cholecalciferol (VITAMIN D) 50 MCG (2000 UT) tablet Take 2,000 Units by mouth daily.    [provider]  cinacalcet (SENSIPAR) 30 MG tablet Take 60 mg by mouth every Monday, Wednesday, and Friday with hemodialysis.    [provider]  cloNIDine (CATAPRES - DOSED IN MG/24 HR) 0.3 mg/24hr patch Place 0.3 mg onto the skin every Sunday.    [provider]  cyclobenzaprine (FLEXERIL) 10 MG tablet Take 10 mg by mouth 3 (three) times daily as needed for muscle spasms.    [provider]  Darbepoetin Alfa (ARANESP) 200 MCG/0.4ML SOSY injection Inject 0.4 mLs (200 mcg total) into the vein every Wednesday with hemodialysis. 10/10/20   Gershon Crane E, PA-C  diclofenac Sodium (VOLTAREN) 1 % GEL Apply 2 g topically daily as needed (pain).    [provider]  docusate sodium (COLACE) 100 MG capsule Take 100 mg by mouth daily.  [provider]  EPINEPHrine 0.3 mg/0.3 mL IJ SOAJ injection Inject into the muscle as needed for anaphylaxis. 07/12/21   [provider]  ferrous sulfate 325 (65 FE) MG tablet Take 325 mg by mouth 2 (two) times a week. Tues and Thurs    [provider]  fluticasone (FLONASE) 50 MCG/ACT nasal spray Place 1 spray into both nostrils daily as needed for allergies or rhinitis.    [provider]  heparin 1000 unit/mL SOLN injection 1 mL (1,000 Units total) by Dialysis route as needed (in dialysis). Patient not taking: Reported on 08/21/2022 10/04/20   Gershon Crane E, PA-C  hydrALAZINE (APRESOLINE) 100 MG tablet Take 1 tablet (100 mg total) by mouth every 8 (eight) hours. 07/06/20   Cresenzo, Cyndi Lennert, MD  hydrocortisone (ANUSOL-HC) 2.5 % rectal cream Place rectally 2 (two) times daily. Patient taking differently: Place 1 Application rectally 2 (two) times daily as needed for anal itching or hemorrhoids. 07/23/21   Park Pope, MD  isosorbide mononitrate (IMDUR) 60 MG 24 hr tablet Take 1 tablet (60 mg total) by mouth daily. 08/14/21   Bhagat, Bhavinkumar, PA  LORazepam (ATIVAN) 1 MG tablet Take 1 mg by mouth every 8 (eight) hours as needed for anxiety. 06/25/20   [provider]  montelukast (SINGULAIR) 10 MG tablet Take 10 mg by mouth daily with supper.     [provider]  multivitamin (RENA-VIT) TABS tablet Take 1 tablet by mouth every Monday, Wednesday, and Friday.    [provider]  NIFEdipine (PROCARDIA XL/NIFEDICAL XL) 60 MG 24 hr tablet Take 60 mg by mouth 2 (two) times daily. 10/01/21   [provider]  olmesartan (BENICAR) 40 MG tablet Take 40 mg by mouth every evening. 05/06/19   [provider]  ondansetron (ZOFRAN) 4 MG tablet Take 4 mg by mouth 2 (two) times daily as needed for nausea or vomiting.    [provider]  Oxycodone HCl 10 MG TABS Take 10 mg by mouth 2 (two) times daily as needed for pain. 01/11/21   [provider]  OXYGEN Inhale 2 L into the lungs as directed.    [provider]  pantoprazole (PROTONIX) 40 MG tablet Take 40 mg by mouth 2 (two) times daily. 06/09/20   [provider]  predniSONE (DELTASONE) 5 MG tablet Take 1 tablet (5 mg total) by mouth daily with breakfast. 02/27/22   Marrianne Mood, MD  Probiotic Product (PROBIOTIC PO) Take 1 capsule by mouth daily.    [provider]  silver sulfADIAZINE (SILVADENE) 1 % cream Apply 1 Application topically daily as needed (wound care).    [provider]  sorbitol 70 % SOLN Take 30 ml  1-2 times daily as needed for constipation 07/23/21   Park Pope, MD  Tiotropium Bromide-Olodaterol (STIOLTO RESPIMAT) 2.5-2.5 MCG/ACT AERS Inhale 2 puffs once daily into the lungs. 11/28/21   Hunsucker, Lesia Sago, MD  VENTOLIN HFA 108 (90 Base) MCG/ACT inhaler Inhale 2 puffs into the lungs See admin instructions. Take 2 puffs every 4-6 hours as needed for wheezing and shortness of breath 04/08/22   Hunsucker, Lesia Sago, MD  White Petrolatum-Mineral Oil (SOOTHE NIGHTTIME) OINT Place 1 Application into both eyes at bedtime.    [provider]  zolpidem (AMBIEN) 5 MG tablet Take 5 mg by mouth at bedtime as needed for sleep. 02/04/21   [provider]      Allergies    3-methyl-2-benzothiazolinone hydrazone, Banana, Black walnut flavor,  Hazelnut (filbert), Leflunomide and related, Lisinopril, No healthtouch food allergies, Other, Pecan nut (diagnostic), Trazodone and nefazodone, Adalimumab, Escitalopram oxalate, Ezetimibe, Secukinumab, Statins, Ferrlecit [na ferric gluc cplx in sucrose], Gabapentin, and Nsaids    Review of Systems   Review of Systems  Constitutional:  Negative for chills and fever.  HENT:  Negative for sore throat.   Eyes:  Negative for redness.  Respiratory:  Negative for cough.   Cardiovascular:  Negative for chest pain and leg swelling.  Gastrointestinal:  Positive for abdominal pain and nausea. Negative for blood in stool and vomiting.  Genitourinary:  Negative for dysuria and flank pain.  Musculoskeletal:  Negative for back pain.  Skin:  Negative for rash.  Neurological:  Negative for headaches.  Psychiatric/Behavioral:  Negative for confusion.     Physical Exam Updated Vital Signs Pulse 82   Resp 19   Ht 1.6 m (5\' 3" )   Wt 61 kg   SpO2 97%   BMI 23.82 kg/m  Physical Exam Vitals and nursing note reviewed.  Constitutional:      Appearance: Normal appearance. She is well-developed.  HENT:     Head: Atraumatic.     Nose: Nose normal.      Mouth/Throat:     Mouth: Mucous membranes are moist.  Eyes:     General: No scleral icterus.    Conjunctiva/sclera: Conjunctivae normal.  Neck:     Trachea: No tracheal deviation.  Cardiovascular:     Rate and Rhythm: Normal rate and regular rhythm.     Pulses: Normal pulses.     Heart sounds: Normal heart sounds. No murmur heard.    No friction rub. No gallop.  Pulmonary:     Effort: Pulmonary effort is normal. No respiratory distress.     Breath sounds: Normal breath sounds.  Abdominal:     General: Bowel sounds are normal. There is no distension.     Palpations: Abdomen is soft. There is no mass.     Tenderness: There is abdominal tenderness. There is no guarding.     Comments: Mid/gen abd tenderness.   Genitourinary:    Comments: No cva tenderness.  Musculoskeletal:        General: No swelling or tenderness.     Cervical back: Normal range of motion and neck supple. No rigidity. No muscular tenderness.  Skin:    General: Skin is warm and dry.     Findings: No rash.  Neurological:     Mental Status: She is alert.     Comments: Alert, speech normal.   Psychiatric:        Mood and Affect: Mood normal.     ED Results / Procedures / Treatments   Labs (all labs ordered are listed, but only abnormal results are displayed) Results for orders placed or performed during the hospital encounter of 09/19/22  CBC  Result Value Ref Range   WBC 3.7 (L) 4.0 - 10.5 K/uL   RBC 4.05 3.87 - 5.11 MIL/uL   Hemoglobin 12.8 12.0 - 15.0 g/dL   HCT 16.1 09.6 - 04.5 %   MCV 105.7 (H) 80.0 - 100.0 fL   MCH 31.6 26.0 - 34.0 pg   MCHC 29.9 (L) 30.0 - 36.0 g/dL   RDW 40.9 81.1 - 91.4 %   Platelets 129 (L) 150 - 400 K/uL   nRBC 0.0 0.0 - 0.2 %  Comprehensive metabolic panel  Result Value Ref Range   Sodium 137 135 - 145 mmol/L   Potassium 4.2  3.5 - 5.1 mmol/L   Chloride 99 98 - 111 mmol/L   CO2 25 22 - 32 mmol/L   Glucose, Bld 41 (LL) 70 - 99 mg/dL   BUN 33 (H) 8 - 23 mg/dL    Creatinine, Ser 4.69 (H) 0.44 - 1.00 mg/dL   Calcium 9.5 8.9 - 62.9 mg/dL   Total Protein 6.9 6.5 - 8.1 g/dL   Albumin 4.0 3.5 - 5.0 g/dL   AST 16 15 - 41 U/L   ALT 5 0 - 44 U/L   Alkaline Phosphatase 130 (H) 38 - 126 U/L   Total Bilirubin 0.4 0.3 - 1.2 mg/dL   GFR, Estimated 7 (L) >60 mL/min   Anion gap 13 5 - 15  Lipase, blood  Result Value Ref Range   Lipase 28 11 - 51 U/L  POC CBG, ED  Result Value Ref Range   Glucose-Capillary 127 (H) 70 - 99 mg/dL    ED ECG REPORT   Date: 09/19/2022  Rate: 82  Rhythm: normal sinus rhythm  QRS Axis: left  Intervals: normal  ST/T Wave abnormalities: nonspecific ST/T changes  Conduction Disutrbances:left anterior fascicular block  Narrative Interpretation:   Old EKG Reviewed: unchanged  I have personally reviewed the EKG tracing    Radiology CT ABDOMEN PELVIS W CONTRAST  Result Date: 09/19/2022 CLINICAL DATA:  Dialysis patient with developing abdominal pain and nausea. EXAM: CT ABDOMEN AND PELVIS WITH CONTRAST TECHNIQUE: Multidetector CT imaging of the abdomen and pelvis was performed using the standard protocol following bolus administration of intravenous contrast. RADIATION DOSE REDUCTION: This exam was performed according to the departmental dose-optimization program which includes automated exposure control, adjustment of the mA and/or kV according to patient size and/or use of iterative reconstruction technique. CONTRAST:  75mL OMNIPAQUE IOHEXOL 300 MG/ML  SOLN COMPARISON:  CT angiogram 07/24/2022. Multiple older CT examinations. FINDINGS: Lower chest: Heart is diffusely enlarged. Coronary artery calcifications are seen. Small pericardial effusion. Trace pleural fluid. Enlargement of the pulmonary vessels. Hepatobiliary: Contrast bolus is more late arterial. Incomplete opacification of the hepatic veins. Fatty liver infiltration identified. Distended gallbladder with wall thickening. Patent portal vein. Pancreas: Unremarkable. No  pancreatic ductal dilatation or surrounding inflammatory changes. Spleen: Spleen is nonenlarged. There is low-attenuation lesion along the inferior aspect of the spleen measuring 7 mm on series 2, image 30. Too small to completely characterize but unchanged going back to multiple prior examinations. Nonaggressive in appearance. No additional follow-up. Adrenals/Urinary Tract: The adrenal glands are preserved. There is moderate atrophy of the kidneys with the extensive vascular calcifications extending into the hilum. 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 No specific imaging follow-up. No clearly enhancing mass at this time. The ureters have normal course and caliber extending down to the bladder. Bladder is contracted. Stomach/Bowel: No oral contrast. The stomach is mildly distended with fluid. The small bowel is nondilated diffusely. Normal appendix in the right lower quadrant. The large bowel demonstrates diffuse colonic diverticula. There is some wall thickening along the transverse colon. A colitis is possible. Colon is nondilated. Vascular/Lymphatic: Diffuse vascular calcifications identified. Normal caliber aorta and IVC. There are some enlarged retroperitoneal nodes identified. Example left para-on series 2, image 31 measures 19 by 16 mm. This was seen previously in retrospect and is unchanged. Further smaller nodes identified elsewhere along the retroperitoneum. Reproductive: Uterus and bilateral adnexa are unremarkable. Other: Increasing anasarca.  Mesenteric haziness and mild ascites. Musculoskeletal: Significant streak artifact related to the spinal fixation hardware  along the lumbar spine with stable compression deformities stable endplate irregularity at the L2-3 level. Diffuse bony sclerosis identified related to the patient's known history of chronic renal failure. Multilevel stenosis along the spine. IMPRESSION: Developing wall thickening along the transverse colon.  Possible colitis. Increasing stranding, ascites and anasarca. Extensive colonic diverticula. Tiny right pleural effusion. Distended gallbladder with some wall thickening. Please correlate for any clinical symptoms of acute gallbladder pathology and if needed follow up gallbladder ultrasound. Stable mildly enlarged retroperitoneal lymph node. Moderate renal atrophy. Electronically Signed   By: Karen Kays M.D.   On: 09/19/2022 11:56    Procedures Procedures    Medications Ordered in ED Medications  morphine (PF) 2 MG/ML injection 2 mg (has no administration in time range)  ondansetron (ZOFRAN) injection 4 mg (has no administration in time range)  pantoprazole (PROTONIX) injection 40 mg (has no administration in time range)    ED Course/ Medical Decision Making/ A&P                                 Medical Decision Making Problems Addressed: Abdominal pain, generalized: acute illness or injury    Details: Acute/chronic Elevated blood pressure reading: acute illness or injury ESRD on dialysis [facility]): chronic illness or injury with exacerbation, progression, or side effects of treatment that poses a threat to life or bodily functions Other ascites: acute illness or injury Thrombocytopenia (HCC): chronic illness or injury  Amount and/or Complexity of Data Reviewed External Data Reviewed: notes. Labs: ordered. Decision-making details documented in ED Course. Radiology: ordered and independent interpretation performed. Decision-making details documented in ED Course. ECG/medicine tests: ordered and independent interpretation performed. Decision-making details documented in ED Course.  Risk Prescription drug management. Parenteral controlled substances. Decision regarding hospitalization.   Iv ns. Continuous pulse ox and cardiac monitoring. Labs ordered/sent. Imaging ordered.   Differential diagnosis includes diverticulitis, pud, pancreatitis, etc. Dispo decision including potential  need for admission considered - will get labs and imaging and reassess.   Reviewed nursing notes and prior charts for additional history. External reports reviewed.   Cardiac monitor: sinus rhythm, rate 80.  Morphine iv. Zofran iv.   Labs reviewed/interpreted by me - k normal. Glucose low - pt indicates had not yet eaten today. D50, po fluids and food provided. Recheck blood glucose improved. Wbc not elevated, hct normal. LFTs normal. CKD/ESRD.   CT reviewed/interpreted by me - ascites/anasarca. Similar thickening of gb/colon as noted on prior imaging 07/2022. Pt without fever, wbc not elevated, and repeat abd exam soft, non tender.   Currently hr 64. Rr 14, pulse ox 94%. Abd soft nt. Pt currently appears stable for dialysis - RN contacted her center and they indicate pt can go there now.   Rec close pcp f/u.  Return precautions provided.          Final Clinical Impression(s) / ED Diagnoses Final diagnoses:  None    Rx / DC Orders ED Discharge Orders     None         Cathren Laine, MD 09/19/22 1243

## 2022-09-19 NOTE — ED Notes (Addendum)
MRI called stating that they could not finish the PT's MRI and they requested the PT to be given something to calm her down.The daughter stated that she does not need anything to calm her down and that she needs a MRI now. This was explained to the physician Donnald Garre) who also stated she needs a MRI. Called MRI to state the daughters concern about medication and they said they would bring her back but if she was agitated they would send her back again without completing it.

## 2022-09-19 NOTE — Progress Notes (Signed)
Pt brought to MRI for attempt. Upon attempting to scan, pt immediately grabbing at head coil trying to climb out of scanner. Unsafe to scan in current state. Tried to reach RN for potential meds but no answer at time of. Pt sent back to ED.

## 2022-09-19 NOTE — ED Notes (Signed)
Discharge paperwork reviewed entirely with patient, including follow up care. Pain was under control. No prescriptions were called in, but all questions were addressed.  Pt verbalized understanding as well as all parties involved. No questions or concerns voiced at the time of discharge. No acute distress noted.   Pt ambulated out to PVA without incident or assistance.  

## 2022-09-19 NOTE — H&P (Incomplete)
PCP:   Malka So., MD   Chief Complaint:  Altered mental status  HPI: This is a 62 year old female with past medical history of ESRD, CAD, HTN, anemia, chronic back pain, GERD, COPD, chronic respiratory failure, OSA, paroxysmal atrial fibrillation, chronic diastolic heart failure.  2 days ago patient took her oxycodone, yesterday patient took her Ativan.  This morning she woke up she was a bit confused and had abdominal pain.  She came to the EDP to be evaluated.  Patient glucose was low at 41, she had not eaten yet for the day.  She was given D50, IV morphine and Zofran.  CT abdomen pelvis which showed thickening of the gallbladder and colon which was seen on prior CT on 07/2022.  With no other signs of illness, patient was discharged.  Patient went to her scheduled hemodialysis.  She had only 2 hours of hemodialysis today as patient's behavior remained altered.  When her son picked her up patient was listless, confused and fatigued.  She continued to complain of abdominal pain.  She was taken to the ER.  Per daughter patient has eaten very little in the last 24 hours.  She has had a headache in addition to abdominal pain.  There is reports of fever, chills, nausea or vomiting.  Of note per daughter patient has had this before.  This is probably her fourth similar episode.  In May she was admitted to [facility] where she was diagnosed with a UTI, acute metabolic encephalopathy.  Patient also had severe symptomatic anemia.  Patient transfused, treated with fosfomycin and discharge.  Per daughter patient make very little urine.  In the ER patient presenting vitals 170/62, HR 82 RR 19, afebrile.  Patient confused.  Arousable.  Persistent hypoglycemia FS BS 59, 155, 103, 93.  Amp D50 patient has been given.  Hemoglobin A1c 4.5.  WBC normal at 3.7.  MCV 105.7.  Platelets 129.  Chest x-ray with cardiomegaly with interstitial edema. MRI head ordered.  Review of Systems:  Per HPI  Past Medical  History: Past Medical History:  Diagnosis Date   Anemia of chronic disease    Anxiety    Arthritis    oa and psoriatic ra   Asthma    Back pain, chronic    lower back   Chronic insomnia    COPD (chronic obstructive pulmonary disease) (HCC)    Diabetes mellitus    type 2   ESRD on dialysis [facility])    Essential hypertension    GERD (gastroesophageal reflux disease)    Gout    History of hiatal hernia    Hypoalbuminemia    Hyponatremia    Mild aortic stenosis    Mitral regurgitation    Mitral stenosis    Obesity    PAD (peripheral artery disease) (HCC)    a. externial iliac calcification seen on CT 04/2020.   Sleep apnea    Tobacco abuse    Upper GI bleed 05/2019   Past Surgical History:  Procedure Laterality Date   BACK SURGERY  2016   lower back fusion with cage   BIOPSY  09/23/2017   Procedure: BIOPSY;  Surgeon: Graylin Shiver, MD;  Location: [facility] ENDOSCOPY;  Service: Endoscopy;;   BIOPSY  05/19/2019   Procedure: BIOPSY;  Surgeon: Kathi Der, MD;  Location: [facility] ENDOSCOPY;  Service: Gastroenterology;;   CESAREAN SECTION  1990   x 1    COLONOSCOPY WITH PROPOFOL N/A 09/23/2017   Procedure: COLONOSCOPY WITH PROPOFOL Hemostatic clips placed;  Surgeon: Graylin Shiver, MD;  Location: [facility] ENDOSCOPY;  Service: Endoscopy;  Laterality: N/A;   COLONOSCOPY WITH PROPOFOL N/A 10/02/2020   Procedure: COLONOSCOPY WITH PROPOFOL;  Surgeon: Willis Modena, MD;  Location: [facility] ENDOSCOPY;  Service: Endoscopy;  Laterality: N/A;   CORONARY ARTERY BYPASS GRAFT N/A 09/13/2020   Procedure: CORONARY ARTERY BYPASS GRAFTING (CABG), ON PUMP, TIMES TWO, USING LEFT INTERNAL MAMMARY ARTERY AND RIGHT ENDOSCOPICALLY HARVESTED GREATER SAPHENOUS VEIN;  Surgeon: Alleen Borne, MD;  Location: [facility] OR;  Service: Open Heart Surgery;  Laterality: N/A;   DILATION AND CURETTAGE OF UTERUS  1988   ENTEROSCOPY N/A 07/22/2021   Procedure: ENTEROSCOPY;  Surgeon: Kerin Salen, MD;  Location: [facility] ENDOSCOPY;  Service:  Gastroenterology;  Laterality: N/A;   ESOPHAGOGASTRODUODENOSCOPY N/A 09/22/2020   Procedure: ESOPHAGOGASTRODUODENOSCOPY (EGD);  Surgeon: Willis Modena, MD;  Location: [facility] ENDOSCOPY;  Service: Endoscopy;  Laterality: N/A;   ESOPHAGOGASTRODUODENOSCOPY (EGD) WITH PROPOFOL N/A 09/23/2017   Procedure: ESOPHAGOGASTRODUODENOSCOPY (EGD) WITH PROPOFOL;  Surgeon: Graylin Shiver, MD;  Location: [facility] ENDOSCOPY;  Service: Endoscopy;  Laterality: N/A;   ESOPHAGOGASTRODUODENOSCOPY (EGD) WITH PROPOFOL N/A 05/19/2019   Procedure: ESOPHAGOGASTRODUODENOSCOPY (EGD) WITH PROPOFOL;  Surgeon: Kathi Der, MD;  Location: [facility]OS[facility];  Service: Gastroenterology;  Laterality: N/A;   ESOPHAGOGASTRODUODENOSCOPY (EGD) WITH PROPOFOL N/A 10/02/2020   Procedure: ESOPHAGOGASTRODUODENOSCOPY (EGD) WITH PROPOFOL;  Surgeon: Willis Modena, MD;  Location: [facility] ENDOSCOPY;  Service: Endoscopy;  Laterality: N/A;   GIVENS CAPSULE STUDY N/A 05/19/2019   Procedure: GIVENS CAPSULE STUDY;  Surgeon: Kathi Der, MD;  Location: [facility] ENDOSCOPY;  Service: Gastroenterology;  Laterality: N/A;   GIVENS CAPSULE STUDY N/A 07/19/2021   Procedure: GIVENS CAPSULE STUDY;  Surgeon: [name], MD;  Location: [facility] ENDOSCOPY;  Service: Gastroenterology;  Laterality: N/A;   HEMOSTASIS CLIP PLACEMENT  09/22/2020   Procedure: HEMOSTASIS CLIP PLACEMENT;  Surgeon: Willis Modena, MD;  Location: [facility] ENDOSCOPY;  Service: Endoscopy;;   HEMOSTASIS CONTROL  09/22/2020   Procedure: HEMOSTASIS CONTROL;  Surgeon: Willis Modena, MD;  Location: [facility] ENDOSCOPY;  Service: Endoscopy;;   HOT HEMOSTASIS N/A 05/19/2019   Procedure: HOT HEMOSTASIS (ARGON PLASMA COAGULATION/BICAP);  Surgeon: Kathi Der, MD;  Location: [facility] ENDOSCOPY;  Service: Gastroenterology;  Laterality: N/A;   LEFT HEART CATH AND CORONARY ANGIOGRAPHY N/A 09/10/2020   Procedure: LEFT HEART CATH AND CORONARY ANGIOGRAPHY;  Surgeon: Yvonne Kendall, MD;  [facility] INVASIVE CV LAB;  Service: Cardiovascular;   Laterality: N/A;   PARTIAL KNEE ARTHROPLASTY Left 11/12/2017   Procedure: left unicompartmental arthroplasty-medial;  Surgeon: Durene Romans, MD;  Location: [facility] ORS;  Service: Orthopedics;  Laterality: Left;    RIGHT HEART CATH AND CORONARY/GRAFT ANGIOGRAPHY N/A 06/26/2022   Procedure: RIGHT HEART CATH AND CORONARY/GRAFT ANGIOGRAPHY;  Surgeon: [name], MD;  Location: [facility] INVASIVE CV LAB;  Service: Cardiovascular;  Laterality: N/A;   SUBMUCOSAL INJECTION  09/23/2017   Procedure: SUBMUCOSAL INJECTION;  Surgeon: Graylin Shiver, MD;  Location: [facility] ENDOSCOPY;  Service: Endoscopy;;  in colon   TEE WITHOUT CARDIOVERSION N/A 09/13/2020   Procedure: TRANSESOPHAGEAL ECHOCARDIOGRAM (TEE);  Surgeon: Alleen Borne, MD;  Location: [facility] OR;  Service: Open Heart Surgery;  Laterality: N/A;   TUBAL LIGATION  1990    Medications: Prior to Admission medications   Medication Sig Start Date End Date Taking? Authorizing Provider  allopurinol (ZYLOPRIM) 100 MG tablet Take 100 mg by mouth every evening.   Yes [provider]  cloNIDine (CATAPRES - DOSED IN MG/24 HR) 0.3 mg/24hr patch Place 0.3 mg onto the skin every  Sunday.   Yes [provider]  acetaminophen (TYLENOL) 500 MG tablet Take 1,000 mg by mouth daily as needed (pain).    [provider]  albuterol (PROVENTIL) (2.5 MG/3ML) 0.083% nebulizer solution Take 3 mLs (2.5 mg total) by nebulization every 6 (six) hours as needed for wheezing or shortness of breath. 07/10/21   Hunsucker, Lesia Sago, MD  aspirin EC 81 MG tablet Take 81 mg by mouth daily as needed (CAd). Swallow whole.    [provider]  B Complex-C (SUPER B COMPLEX PO) Take 1 tablet by mouth 4 (four) times a week. Take on Sun, Tues, Thurs, and Sat    [provider]  Camphor-Eucalyptus-Menthol (VICKS VAPORUB EX) Apply 1 Application topically at bedtime as needed (congestion). Patient not taking: Reported on 08/21/2022    [provider]   carboxymethylcellulose 1 % ophthalmic solution Place 1 drop into both eyes 3 (three) times daily.    [provider]  carvedilol (COREG) 25 MG tablet Take 25 mg by mouth 2 (two) times daily with a meal.    [provider]  cetirizine (ZYRTEC) 10 MG tablet Take 10 mg by mouth daily as needed for allergies.    [provider]  Cholecalciferol (VITAMIN D) 50 MCG (2000 UT) tablet Take 2,000 Units by mouth daily.    [provider]  cinacalcet (SENSIPAR) 30 MG tablet Take 60 mg by mouth every Monday, Wednesday, and Friday with hemodialysis.    [provider]  cyclobenzaprine (FLEXERIL) 10 MG tablet Take 10 mg by mouth 3 (three) times daily as needed for muscle spasms.    [provider]  Darbepoetin Alfa (ARANESP) 200 MCG/0.4ML SOSY injection Inject 0.4 mLs (200 mcg total) into the vein every Wednesday with hemodialysis. 10/10/20   Gershon Crane E, PA-C  diclofenac Sodium (VOLTAREN) 1 % GEL Apply 2 g topically daily as needed (pain).    [provider]  docusate sodium (COLACE) 100 MG capsule Take 100 mg by mouth daily.    [provider]  EPINEPHrine 0.3 mg/0.3 mL IJ SOAJ injection Inject into the muscle as needed for anaphylaxis. 07/12/21   [provider]  ferrous sulfate 325 (65 FE) MG tablet Take 325 mg by mouth 2 (two) times a week. Tues and Thurs    [provider]  fluticasone (FLONASE) 50 MCG/ACT nasal spray Place 1 spray into both nostrils daily as needed for allergies or rhinitis.    [provider]  heparin 1000 unit/mL SOLN injection 1 mL (1,000 Units total) by Dialysis route as needed (in dialysis). Patient not taking: Reported on 08/21/2022 10/04/20   Gershon Crane E, PA-C  hydrALAZINE (APRESOLINE) 100 MG tablet Take 1 tablet (100 mg total) by mouth every 8 (eight) hours. 07/06/20   Cresenzo, Cyndi Lennert, MD  hydrocortisone (ANUSOL-HC) 2.5 % rectal cream Place rectally 2 (two) times daily. Patient taking  differently: Place 1 Application rectally 2 (two) times daily as needed for anal itching or hemorrhoids. 07/23/21   Park Pope, MD  isosorbide mononitrate (IMDUR) 60 MG 24 hr tablet Take 1 tablet (60 mg total) by mouth daily. 08/14/21   Bhagat, Bhavinkumar, PA  LORazepam (ATIVAN) 1 MG tablet Take 1 mg by mouth every 8 (eight) hours as needed for anxiety. 06/25/20   [provider]  montelukast (SINGULAIR) 10 MG tablet Take 10 mg by mouth daily with supper.     [provider]  multivitamin (RENA-VIT) TABS tablet Take 1 tablet by mouth every  Monday, Wednesday, and Friday.    [provider]  NIFEdipine (PROCARDIA XL/NIFEDICAL XL) 60 MG 24 hr tablet Take 60 mg by mouth 2 (two) times daily. 10/01/21   [provider]  olmesartan (BENICAR) 40 MG tablet Take 40 mg by mouth every evening. 05/06/19   [provider]  ondansetron (ZOFRAN) 4 MG tablet Take 4 mg by mouth 2 (two) times daily as needed for nausea or vomiting.    [provider]  Oxycodone HCl 10 MG TABS Take 10 mg by mouth 2 (two) times daily as needed for pain. 01/11/21   [provider]  OXYGEN Inhale 2 L into the lungs as directed.    [provider]  pantoprazole (PROTONIX) 40 MG tablet Take 40 mg by mouth 2 (two) times daily. 06/09/20   [provider]  predniSONE (DELTASONE) 5 MG tablet Take 1 tablet (5 mg total) by mouth daily with breakfast. 02/27/22   Marrianne Mood, MD  Probiotic Product (PROBIOTIC PO) Take 1 capsule by mouth daily.    [provider]  silver sulfADIAZINE (SILVADENE) 1 % cream Apply 1 Application topically daily as needed (wound care).    [provider]  sorbitol 70 % SOLN Take 30 ml 1-2 times daily as needed for constipation 07/23/21   Park Pope, MD  Tiotropium Bromide-Olodaterol (STIOLTO RESPIMAT) 2.5-2.5 MCG/ACT AERS Inhale 2 puffs once daily into the lungs. 11/28/21   Hunsucker, Lesia Sago, MD  VENTOLIN HFA 108 (90 Base)  MCG/ACT inhaler Inhale 2 puffs into the lungs See admin instructions. Take 2 puffs every 4-6 hours as needed for wheezing and shortness of breath 04/08/22   Hunsucker, Lesia Sago, MD  White Petrolatum-Mineral Oil (SOOTHE NIGHTTIME) OINT Place 1 Application into both eyes at bedtime.    [provider]  zolpidem (AMBIEN) 5 MG tablet Take 5 mg by mouth at bedtime as needed for sleep. 02/04/21   [provider]    Allergies:   Allergies  Allergen Reactions   3-Methyl-2-Benzothiazolinone Hydrazone Other (See Comments)    Muscle weakness muscle cramping in legs Other reaction(s): Myalgias (Muscle Pain)   Banana Anaphylaxis, Swelling and Other (See Comments)    TONGUE AND MOUTH SWELLING   Black Walnut Flavor Anaphylaxis and Itching    Walnuts   Hazelnut (Filbert) Anaphylaxis    Hazelnuts   Leflunomide And Related Other (See Comments)    Severe Headache   Lisinopril Swelling    Angioedema  Swelling of the face   No [employer]altht[employer] Food Allergies Anaphylaxis    Spicy Mustard 4.7.2021 Pt reports that she eats Regular Yellow Mustard Swelling and itching of tongue   Other Other (See Comments), Anaphylaxis and Swelling    Cosentyx= angioedema  Other reaction(s): Facial Edema (intolerance) Mouth itches and tongue swelling Hazel nuts and pecans also Walnuts Walnuts Renal failure Other reaction(s): Other Serotonin Syndrome  Serotonin syndrome  Tremors Other reaction(s): Other Tremors Muscle weakness muscle cramping in legs Other reaction(s): Myalgias (Muscle Pain) Mouth itches and tongue swelling Hazel nuts and pecans also Walnuts    Pecan Nut (Diagnostic) Anaphylaxis and Itching    Pecans   Trazodone And Nefazodone Other (See Comments)    Serotonin Syndrome  Tremors   Adalimumab Other (See Comments)    Blisters on abdomen =humira   Escitalopram Oxalate Other (See Comments)    Hand problems - Serotonin Syndrome     Ezetimibe Other (See Comments)     myalgias cramps    Secukinumab Swelling  Cosentyx = angiodema    Statins Other (See Comments)    Leg pains and weakness   Ferrlecit [Na Ferric Gluc Cplx In Sucrose]     Not reaction given from HD   Gabapentin Other (See Comments)    tremors   Nsaids     Avoid due to renal problems     Social History:  reports that she quit smoking about 2 years ago. Her smoking use included cigarettes. She started smoking about 42 years ago. She has a 20 pack-year smoking history. She has never used smokeless tobacco. She reports current alcohol use. She reports that she does not use drugs.  Family History: Family History  Problem Relation Age of Onset   Lung cancer Mother    Diabetes Sister    Heart disease Sister    Asthma Daughter    Arthritis Daughter    Arthritis Daughter    Thyroid cancer Other        1/2 sister   Heart disease Other        1/2 sister    Physical Exam: Vitals:   09/19/22 1737 09/19/22 1748 09/19/22 1757 09/19/22 2132  BP:  (!) 190/98 (!) 181/83 (!) 147/60  Pulse:  75 84 83  Resp:  18 17 15   Temp:  98.7 F (37.1 C) 99 F (37.2 C) 98.6 F (37 C)  TempSrc:  Oral Oral Oral  SpO2:  100% 100% 95%  Weight: 61 kg     Height: 5\' 3"  (1.6 m)       General: Arousable but somnolent, well developed and nourished, no acute distress Eyes: PERRLA,, no scleral icterus ENT: Moist oral mucosa, neck supple, no thyromegaly Lungs: CTA B/L, no wheeze, no crackles, no use of accessory muscles Cardiovascular: RRR with 3/6 SEM. No carotid bruits, 2+ JVD Abdomen: soft, positive BS,  not an acute abdomen GU: not examined Neuro: Unable to appropriately assess due to patient's somnolence but no gross neurological defect appreciated Musculoskeletal: Stable Skin: no rash, no subcutaneous crepitation, no decubitus Psych: Arousable but somnolent  Labs on Admission:  Recent Labs    09/19/22 0916 09/19/22 2116  NA 137 134*  K 4.2 4.0  CL 99 100  CO2 25 24  GLUCOSE 41* 61*   BUN 33* 22  CREATININE 6.53* 5.15*  CALCIUM 9.5 8.7*   Recent Labs    09/19/22 0916  AST 16  ALT 5  ALKPHOS 130*  BILITOT 0.4  PROT 6.9  ALBUMIN 4.0   Recent Labs    09/19/22 0916  LIPASE 28   Recent Labs    09/19/22 0916 09/19/22 2116  WBC 3.7* 4.4  HGB 12.8 11.9*  HCT 42.8 40.7  MCV 105.7* 110.3*  PLT 129* 121*    Radiological Exams on Admission: CT ABDOMEN PELVIS W CONTRAST  Result Date: 09/19/2022 CLINICAL DATA:  Dialysis patient with developing abdominal pain and nausea. EXAM: CT ABDOMEN AND PELVIS WITH CONTRAST TECHNIQUE: Multidetector CT imaging of the abdomen and pelvis was performed using the standard protocol following bolus administration of intravenous contrast. RADIATION DOSE REDUCTION: This exam was performed according to the departmental dose-optimization program which includes automated exposure control, adjustment of the mA and/or kV according to patient size and/or use of iterative reconstruction technique. CONTRAST:  75mL OMNIPAQUE IOHEXOL 300 MG/ML  SOLN COMPARISON:  CT angiogram 07/24/2022. Multiple older CT examinations. FINDINGS: Lower chest: Heart is diffusely enlarged. Coronary artery calcifications are seen. Small pericardial effusion. Trace pleural fluid. Enlargement of the pulmonary vessels. Hepatobiliary:  Contrast bolus is more late arterial. Incomplete opacification of the hepatic veins. Fatty liver infiltration identified. Distended gallbladder with wall thickening. Patent portal vein. Pancreas: Unremarkable. No pancreatic ductal dilatation or surrounding inflammatory changes. Spleen: Spleen is nonenlarged. There is low-attenuation lesion along the inferior aspect of the spleen measuring 7 mm on series 2, image 30. Too small to completely characterize but unchanged going back to multiple prior examinations. Nonaggressive in appearance. No additional follow-up. Adrenals/Urinary Tract: The adrenal glands are preserved. There is moderate atrophy of the  kidneys with the extensive vascular calcifications extending into the hilum. 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 No specific imaging follow-up. No clearly enhancing mass at this time. The ureters have normal course and caliber extending down to the bladder. Bladder is contracted. Stomach/Bowel: No oral contrast. The stomach is mildly distended with fluid. The small bowel is nondilated diffusely. Normal appendix in the right lower quadrant. The large bowel demonstrates diffuse colonic diverticula. There is some wall thickening along the transverse colon. A colitis is possible. Colon is nondilated. Vascular/Lymphatic: Diffuse vascular calcifications identified. Normal caliber aorta and IVC. There are some enlarged retroperitoneal nodes identified. Example left para-on series 2, image 31 measures 19 by 16 mm. This was seen previously in retrospect and is unchanged. Further smaller nodes identified elsewhere along the retroperitoneum. Reproductive: Uterus and bilateral adnexa are unremarkable. Other: Increasing anasarca.  Mesenteric haziness and mild ascites. Musculoskeletal: Significant streak artifact related to the spinal fixation hardware along the lumbar spine with stable compression deformities stable endplate irregularity at the L2-3 level. Diffuse bony sclerosis identified related to the patient's known history of chronic renal failure. Multilevel stenosis along the spine. IMPRESSION: Developing wall thickening along the transverse colon. Possible colitis. Increasing stranding, ascites and anasarca. Extensive colonic diverticula. Tiny right pleural effusion. Distended gallbladder with some wall thickening. Please correlate for any clinical symptoms of acute gallbladder pathology and if needed follow up gallbladder ultrasound. Stable mildly enlarged retroperitoneal lymph node. Moderate renal atrophy. Electronically Signed   By: Karen Kays M.D.   On: 09/19/2022 11:56     Assessment/Plan Present on Admission:  Acute metabolic encephalopathy //hypoglycemia -Likely due to hypoglycemia.  Fingerstick blood sugars every 2 hours initiated.  Initially managing with dextrose boluses given patient's ESRD however given persistence of hypoglycemia and somnolence D5 NS has been initiated. - There is concern for polypharmacy, UTI.  Attempted In-N-Out cath attempted and unsuccessful.  Patient does not appear to abuse her Ativan, Flexeril, oxycodone the clarity of this is unclear she is on her meds. -Will reassess now that dextrose drip initiated -MRI brain ordered -Ativan, Flexeril, oxycodone, Ambien on hold -TSH, ammonia, vitamin B12, folate levels ordered   Chronic diastolic CHF (congestive heart failure) (HCC) // CAD -Imdur, hydralazine, aspirin, Coreg resumed   Chronic respiratory failure with hypoxia (HCC)  COPD (chronic obstructive pulmonary disease) (HCC) -Oxygen resumed, Flonase, Stiolto Respimat pain meds, resumed -Patient on daily prednisone.  40 mg IV Solu-Medrol daily ordered, first dose now   ESRD (end stage renal disease) (HCC) -Will place consult to nephrologist   Essential hypertension -Clonidine patch, hydralazine, Imdur, Coreg   Paroxysmal atrial fibrillation (HCC) -Coreg resumed   Psoriasis arthropathica (HCC) -On daily prednisone   ,  09/19/2022, 10:54 PM

## 2022-09-19 NOTE — ED Notes (Signed)
Got in touch with the daugther Adina to pick up patient.   Called [facility] on [address] and they requested pt arrive before 1400hrs. The daughter is to come pick up the pt immediately, and will take her directly to dialysis. The daughter will arrange pickup for the pt.

## 2022-09-19 NOTE — ED Provider Notes (Signed)
[place] EMERGENCY DEPARTMENT AT [facility] Provider Note   CSN: [id number] Arrival date & time: 09/19/22  1710     History  Chief Complaint  Patient presents with   Altered Mental Status    Jodi Bell is a 62 y.o. female.  HPI Patient was seen earlier today at [facility].  That time note indicates patient was seen for mid and diffuse abdominal pain.  Patient's evaluation did not identify acute problems.  Patient is here with her daughter.  Patient's daughter reports that patient's main problem is and was confusion.  She reports the abdominal pain has not been a significant ongoing problem and occasionally that the patient complains of abdominal pain but the main problem they have observed is confusion and somnolence especially to the point of not speaking later today.    Patient did manage to go to her dialysis session for 2 hours today.  The patient is somnolent and seems confused difficult to get any meaningful history.  Patient's daughter reports that this sometimes is a problem with oversedation from medications.  Reports patient has had similar episodes.  Prescriptions indicate patient may be taking gabapentin, Ativan, zolpidem and and oxycodone 10 mg immediate release.  There are more recent prescriptions.  Patient's daughter reports she is not supposed to be taking all of these medications because of issues with over sedation, but they are not sure if she might get confused and take them or still has some of these medications.    Home Medications Prior to Admission medications   Medication Sig Start Date End Date Taking? Authorizing Provider  acetaminophen (TYLENOL) 500 MG tablet Take 1,000 mg by mouth daily as needed (pain).    [provider]  albuterol (PROVENTIL) (2.5 MG/3ML) 0.083% nebulizer solution Take 3 mLs (2.5 mg total) by nebulization every 6 (six) hours as needed for wheezing or shortness of breath. 07/10/21   Hunsucker, Lesia Sago,  MD  allopurinol (ZYLOPRIM) 100 MG tablet Take 100 mg by mouth every evening.    [provider]  aspirin EC 81 MG tablet Take 81 mg by mouth daily as needed (CAd). Swallow whole.    [provider]  B Complex-C (SUPER B COMPLEX PO) Take 1 tablet by mouth 4 (four) times a week. Take on Sun, Tues, Thurs, and Sat    [provider]  Camphor-Eucalyptus-Menthol (VICKS VAPORUB EX) Apply 1 Application topically at bedtime as needed (congestion). Patient not taking: Reported on 08/21/2022    [provider]  carboxymethylcellulose 1 % ophthalmic solution Place 1 drop into both eyes 3 (three) times daily.    [provider]  carvedilol (COREG) 25 MG tablet Take 25 mg by mouth 2 (two) times daily with a meal.    [provider]  cetirizine (ZYRTEC) 10 MG tablet Take 10 mg by mouth daily as needed for allergies.    [provider]  Cholecalciferol (VITAMIN D) 50 MCG (2000 UT) tablet Take 2,000 Units by mouth daily.    [provider]  cinacalcet (SENSIPAR) 30 MG tablet Take 60 mg by mouth every Monday, Wednesday, and Friday with hemodialysis.    [provider]  cloNIDine (CATAPRES - DOSED IN MG/24 HR) 0.3 mg/24hr patch Place 0.3 mg onto the skin every Sunday.    [provider]  cyclobenzaprine (FLEXERIL) 10 MG tablet Take 10 mg by mouth 3 (three) times daily as needed for muscle spasms.    [provider]  Darbepoetin Alfa (  ARANESP) 200 MCG/0.4ML SOSY injection Inject 0.4 mLs (200 mcg total) into the vein every Wednesday with hemodialysis. 10/10/20   Gershon Crane E, PA-C  diclofenac Sodium (VOLTAREN) 1 % GEL Apply 2 g topically daily as needed (pain).    [provider]  docusate sodium (COLACE) 100 MG capsule Take 100 mg by mouth daily.    [provider]  EPINEPHrine 0.3 mg/0.3 mL IJ SOAJ injection Inject into the muscle as needed for anaphylaxis. 07/12/21   [provider]  ferrous  sulfate 325 (65 FE) MG tablet Take 325 mg by mouth 2 (two) times a week. Tues and Thurs    [provider]  fluticasone (FLONASE) 50 MCG/ACT nasal spray Place 1 spray into both nostrils daily as needed for allergies or rhinitis.    [provider]  heparin 1000 unit/mL SOLN injection 1 mL (1,000 Units total) by Dialysis route as needed (in dialysis). Patient not taking: Reported on 08/21/2022 10/04/20   Gershon Crane E, PA-C  hydrALAZINE (APRESOLINE) 100 MG tablet Take 1 tablet (100 mg total) by mouth every 8 (eight) hours. 07/06/20   Cresenzo, Cyndi Lennert, MD  hydrocortisone (ANUSOL-HC) 2.5 % rectal cream Place rectally 2 (two) times daily. Patient taking differently: Place 1 Application rectally 2 (two) times daily as needed for anal itching or hemorrhoids. 07/23/21   Park Pope, MD  isosorbide mononitrate (IMDUR) 60 MG 24 hr tablet Take 1 tablet (60 mg total) by mouth daily. 08/14/21   Bhagat, Bhavinkumar, PA  LORazepam (ATIVAN) 1 MG tablet Take 1 mg by mouth every 8 (eight) hours as needed for anxiety. 06/25/20   [provider]  montelukast (SINGULAIR) 10 MG tablet Take 10 mg by mouth daily with supper.     [provider]  multivitamin (RENA-VIT) TABS tablet Take 1 tablet by mouth every Monday, Wednesday, and Friday.    [provider]  NIFEdipine (PROCARDIA XL/NIFEDICAL XL) 60 MG 24 hr tablet Take 60 mg by mouth 2 (two) times daily. 10/01/21   [provider]  olmesartan (BENICAR) 40 MG tablet Take 40 mg by mouth every evening. 05/06/19   [provider]  ondansetron (ZOFRAN) 4 MG tablet Take 4 mg by mouth 2 (two) times daily as needed for nausea or vomiting.    [provider]  Oxycodone HCl 10 MG TABS Take 10 mg by mouth 2 (two) times daily as needed for pain. 01/11/21   [provider]  OXYGEN Inhale 2 L into the lungs as directed.    [provider]  pantoprazole (PROTONIX) 40 MG tablet Take 40 mg by mouth 2 (two)  times daily. 06/09/20   [provider]  predniSONE (DELTASONE) 5 MG tablet Take 1 tablet (5 mg total) by mouth daily with breakfast. 02/27/22   Marrianne Mood, MD  Probiotic Product (PROBIOTIC PO) Take 1 capsule by mouth daily.    [provider]  silver sulfADIAZINE (SILVADENE) 1 % cream Apply 1 Application topically daily as needed (wound care).    [provider]  sorbitol 70 % SOLN Take 30 ml 1-2 times daily as needed for constipation 07/23/21   Park Pope, MD  Tiotropium Bromide-Olodaterol (STIOLTO RESPIMAT) 2.5-2.5 MCG/ACT AERS Inhale 2 puffs once daily into the lungs. 11/28/21   Hunsucker, Lesia Sago, MD  VENTOLIN HFA 108 (90 Base) MCG/ACT inhaler Inhale 2 puffs into the lungs See admin instructions. Take 2 puffs every 4-6 hours as needed for wheezing and shortness of breath 04/08/22   Hunsucker,  Lesia Sago, MD  White Petrolatum-Mineral Oil (SOOTHE NIGHTTIME) OINT Place 1 Application into both eyes at bedtime.    [provider]  zolpidem (AMBIEN) 5 MG tablet Take 5 mg by mouth at bedtime as needed for sleep. 02/04/21   [provider]      Allergies    3-methyl-2-benzothiazolinone hydrazone, Banana, Black walnut flavor, Hazelnut (filbert), Leflunomide and related, Lisinopril, No healthtouch food allergies, Other, Pecan nut (diagnostic), Trazodone and nefazodone, Adalimumab, Escitalopram oxalate, Ezetimibe, Secukinumab, Statins, Ferrlecit [na ferric gluc cplx in sucrose], Gabapentin, and Nsaids    Review of Systems   Review of Systems  Physical Exam Updated Vital Signs BP (!) 147/60 (BP Location: Right Arm)   Pulse 83   Temp 98.6 F (37 C) (Oral)   Resp 15   Ht 5\' 3"  (1.6 m)   Wt 61 kg   SpO2 95%   BMI 23.82 kg/m  Physical Exam Constitutional:      Comments: Patient is somnolent.  She is somewhat difficult arouse.  She will open her eyes and mumble but is not very intelligible.  No respiratory distress.  HENT:     Head: Normocephalic  and atraumatic.     Mouth/Throat:     Pharynx: Oropharynx is clear.  Eyes:     Extraocular Movements: Extraocular movements intact.     Pupils: Pupils are equal, round, and reactive to light.  Cardiovascular:     Comments: Heart regular, 2-3 systolic ejection murmur. Pulmonary:     Comments: No respiratory distress.  Lungs grossly clear.  Occasional basilar crackle. Abdominal:     General: There is no distension.     Palpations: Abdomen is soft.     Tenderness: There is no abdominal tenderness. There is no guarding.  Musculoskeletal:     Comments: Patient has muscular atrophy.  No significant peripheral edema.  Extremities are warm and dry.  Skin:    General: Skin is warm and dry.  Neurological:     Comments: Patient is somnolent.  She will open her eyes and mumble some unintelligible words.  She does not appear to have focal motor deficit with some spontaneous use of all 4 extremities.     ED Results / Procedures / Treatments   Labs (all labs ordered are listed, but only abnormal results are displayed) Labs Reviewed  BASIC METABOLIC PANEL - Abnormal; Notable for the following components:      Result Value   Sodium 134 (*)    Glucose, Bld 61 (*)    Creatinine, Ser 5.15 (*)    Calcium 8.7 (*)    GFR, Estimated 9 (*)    All other components within normal limits  CBC - Abnormal; Notable for the following components:   RBC 3.69 (*)    Hemoglobin 11.9 (*)    MCV 110.3 (*)    MCHC 29.2 (*)    Platelets 121 (*)    All other components within normal limits  CBG MONITORING, ED - Abnormal; Notable for the following components:   Glucose-Capillary 59 (*)    All other components within normal limits  CBG MONITORING, ED  CBG MONITORING, ED    EKG None  Radiology CT ABDOMEN PELVIS W CONTRAST  Result Date: 09/19/2022 CLINICAL DATA:  Dialysis patient with developing abdominal pain and nausea. EXAM: CT ABDOMEN AND PELVIS WITH CONTRAST TECHNIQUE: Multidetector CT imaging of the  abdomen and pelvis was performed using the standard protocol following bolus administration of intravenous contrast. RADIATION DOSE REDUCTION: This exam  was performed according to the departmental dose-optimization program which includes automated exposure control, adjustment of the mA and/or kV according to patient size and/or use of iterative reconstruction technique. CONTRAST:  75mL OMNIPAQUE IOHEXOL 300 MG/ML  SOLN COMPARISON:  CT angiogram 07/24/2022. Multiple older CT examinations. FINDINGS: Lower chest: Heart is diffusely enlarged. Coronary artery calcifications are seen. Small pericardial effusion. Trace pleural fluid. Enlargement of the pulmonary vessels. Hepatobiliary: Contrast bolus is more late arterial. Incomplete opacification of the hepatic veins. Fatty liver infiltration identified. Distended gallbladder with wall thickening. Patent portal vein. Pancreas: Unremarkable. No pancreatic ductal dilatation or surrounding inflammatory changes. Spleen: Spleen is nonenlarged. There is low-attenuation lesion along the inferior aspect of the spleen measuring 7 mm on series 2, image 30. Too small to completely characterize but unchanged going back to multiple prior examinations. Nonaggressive in appearance. No additional follow-up. Adrenals/Urinary Tract: The adrenal glands are preserved. There is moderate atrophy of the kidneys with the extensive vascular calcifications extending into the hilum. 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 No specific imaging follow-up. No clearly enhancing mass at this time. The ureters have normal course and caliber extending down to the bladder. Bladder is contracted. Stomach/Bowel: No oral contrast. The stomach is mildly distended with fluid. The small bowel is nondilated diffusely. Normal appendix in the right lower quadrant. The large bowel demonstrates diffuse colonic diverticula. There is some wall thickening along the transverse colon. A colitis  is possible. Colon is nondilated. Vascular/Lymphatic: Diffuse vascular calcifications identified. Normal caliber aorta and IVC. There are some enlarged retroperitoneal nodes identified. Example left para-on series 2, image 31 measures 19 by 16 mm. This was seen previously in retrospect and is unchanged. Further smaller nodes identified elsewhere along the retroperitoneum. Reproductive: Uterus and bilateral adnexa are unremarkable. Other: Increasing anasarca.  Mesenteric haziness and mild ascites. Musculoskeletal: Significant streak artifact related to the spinal fixation hardware along the lumbar spine with stable compression deformities stable endplate irregularity at the L2-3 level. Diffuse bony sclerosis identified related to the patient's known history of chronic renal failure. Multilevel stenosis along the spine. IMPRESSION: Developing wall thickening along the transverse colon. Possible colitis. Increasing stranding, ascites and anasarca. Extensive colonic diverticula. Tiny right pleural effusion. Distended gallbladder with some wall thickening. Please correlate for any clinical symptoms of acute gallbladder pathology and if needed follow up gallbladder ultrasound. Stable mildly enlarged retroperitoneal lymph node. Moderate renal atrophy. Electronically Signed   By: Karen Kays M.D.   On: 09/19/2022 11:56    Procedures Procedures   CRITICAL CARE Performed by: Arby Barrette   Total critical care time: 30 minutes  Critical care time was exclusive of separately billable procedures and treating other patients.  Critical care was necessary to treat or prevent imminent or life-threatening deterioration.  Critical care was time spent personally by me on the following activities: development of treatment plan with patient and/or surrogate as well as nursing, discussions with consultants, evaluation of patient's response to treatment, examination of patient, obtaining history from patient or surrogate,  ordering and performing treatments and interventions, ordering and review of laboratory studies, ordering and review of radiographic studies, pulse oximetry and re-evaluation of patient's condition.  Medications Ordered in ED Medications  dextrose 50 % solution 50 mL (has no administration in time range)  dextrose 50 % solution (50 mLs  Given 09/19/22 2114)    ED Course/ Medical Decision Making/ A&P  Medical Decision Making Amount and/or Complexity of Data Reviewed Labs: ordered.   Patient presents as outlined.  She has altered mental status.  The patient is somnolent.  This is apparently been a waxing and waning condition.  Patient was seen earlier today and evaluated for abdominal pain.  She was seen at a different daughter this morning who is not here this evening.  The daughter here this evening reports that her symptoms this morning were confusion and not abdominal pain thus it is difficult to ascertain what changed when.  The patient reportedly went to dialysis for 2 hours today.  Thus she was likely reasonably awake and oriented for going to dialysis.  Patient's daughter who is currently present, advises that the similar presentation has happened several times previously.  Patient has been admitted for this.  And at times this has been medication for sedation.  Patient was noted this morning to be hypoglycemic and was treated with an amp of D50.  Patient's initial blood sugar was 72.  Recheck later is at 20.  Given patient's mental status and currently not eating or drinking, patient given an amp of D50 for hypoglycemia.  At this time, with patient having significant somnolence and mental status change, she will require admission.  Patient apparently is having some recurrent hypoglycemia since this morning.  This will require close monitoring and serial CBGs.  We are also currently awaiting MRI.  Reportedly patient was taken to MRI but then became too agitated  and was moving around.  At this time we will take the patient back to attempt MRI.  Patient's daughters expressed concern multiple times for possible stroke and would like to have neurology consultation for stroke versus seizure at this time.  Patient General Appearance however is globally encephalopathic and based on history it seems less probable that this presentation represents acute stroke although patient may have had prior strokes.  This time does not meet code stroke criteria.  Will plan to obtain MRI and admit for global encephalopathy highest suspicion for overmedication or metabolic derangement.        Final Clinical Impression(s) / ED Diagnoses Final diagnoses:  Altered mental status, unspecified altered mental status type  Hypoglycemia  ESRD (end stage renal disease) on dialysis [facility])    Rx / DC Orders ED Discharge Orders     None         Arby Barrette, MD 09/19/22 2156

## 2022-09-19 NOTE — ED Triage Notes (Signed)
The ps has seen in triage she will be placing orders on this pt

## 2022-09-19 NOTE — ED Provider Triage Note (Signed)
Emergency Medicine Provider Triage Evaluation Note  Jodi Bell , a 62 y.o. female  was evaluated in triage with her son.  Patient's son reports that patient has become more confused throughout the day.  He was seen at the ED in [facility] this morning for abdominal pain and had dialysis after. Since dialysis, the son reports that patient has become more confused and weak.  Triage today she is sitting in a wheelchair and letting her son speak for her.  Review of Systems  Positive:  Negative:  Patient stares blankly when I was asking her questions but would not speak. Physical Exam  There were no vitals taken for this visit. Gen:   Awake, no distress   Resp:  Normal effort  MSK:   Moves extremities without difficulty  Other:  On 3L of O2 nasal cannula  Medical Decision Making  Medically screening exam initiated at 5:35 PM.  Appropriate orders placed.  [name] was informed that the remainder of the evaluation will be completed by another provider, this initial triage assessment does not replace that evaluation, and the importance of remaining in the ED until their evaluation is complete.   Maxwell Marion, PA-C 09/19/22 [phone]

## 2022-09-20 ENCOUNTER — Inpatient Hospital Stay (HOSPITAL_COMMUNITY): Payer: Medicare PPO

## 2022-09-20 DIAGNOSIS — E1151 Type 2 diabetes mellitus with diabetic peripheral angiopathy without gangrene: Secondary | ICD-10-CM | POA: Diagnosis present

## 2022-09-20 DIAGNOSIS — G928 Other toxic encephalopathy: Secondary | ICD-10-CM | POA: Diagnosis present

## 2022-09-20 DIAGNOSIS — G8929 Other chronic pain: Secondary | ICD-10-CM | POA: Diagnosis present

## 2022-09-20 DIAGNOSIS — Z794 Long term (current) use of insulin: Secondary | ICD-10-CM | POA: Diagnosis not present

## 2022-09-20 DIAGNOSIS — T380X5A Adverse effect of glucocorticoids and synthetic analogues, initial encounter: Secondary | ICD-10-CM | POA: Diagnosis present

## 2022-09-20 DIAGNOSIS — L4050 Arthropathic psoriasis, unspecified: Secondary | ICD-10-CM | POA: Diagnosis present

## 2022-09-20 DIAGNOSIS — Z87891 Personal history of nicotine dependence: Secondary | ICD-10-CM | POA: Diagnosis not present

## 2022-09-20 DIAGNOSIS — G9341 Metabolic encephalopathy: Secondary | ICD-10-CM

## 2022-09-20 DIAGNOSIS — E11649 Type 2 diabetes mellitus with hypoglycemia without coma: Secondary | ICD-10-CM | POA: Diagnosis present

## 2022-09-20 DIAGNOSIS — Z79899 Other long term (current) drug therapy: Secondary | ICD-10-CM | POA: Diagnosis not present

## 2022-09-20 DIAGNOSIS — N186 End stage renal disease: Secondary | ICD-10-CM | POA: Diagnosis present

## 2022-09-20 DIAGNOSIS — D696 Thrombocytopenia, unspecified: Secondary | ICD-10-CM | POA: Diagnosis present

## 2022-09-20 DIAGNOSIS — I5032 Chronic diastolic (congestive) heart failure: Secondary | ICD-10-CM | POA: Diagnosis present

## 2022-09-20 DIAGNOSIS — Z992 Dependence on renal dialysis: Secondary | ICD-10-CM | POA: Diagnosis not present

## 2022-09-20 DIAGNOSIS — Z1152 Encounter for screening for COVID-19: Secondary | ICD-10-CM | POA: Diagnosis not present

## 2022-09-20 DIAGNOSIS — J9611 Chronic respiratory failure with hypoxia: Secondary | ICD-10-CM | POA: Diagnosis present

## 2022-09-20 DIAGNOSIS — E162 Hypoglycemia, unspecified: Secondary | ICD-10-CM

## 2022-09-20 DIAGNOSIS — I480 Paroxysmal atrial fibrillation: Secondary | ICD-10-CM | POA: Diagnosis present

## 2022-09-20 DIAGNOSIS — Z7952 Long term (current) use of systemic steroids: Secondary | ICD-10-CM | POA: Diagnosis not present

## 2022-09-20 DIAGNOSIS — Z96652 Presence of left artificial knee joint: Secondary | ICD-10-CM | POA: Diagnosis present

## 2022-09-20 DIAGNOSIS — J4489 Other specified chronic obstructive pulmonary disease: Secondary | ICD-10-CM | POA: Diagnosis present

## 2022-09-20 DIAGNOSIS — Z888 Allergy status to other drugs, medicaments and biological substances status: Secondary | ICD-10-CM | POA: Diagnosis not present

## 2022-09-20 DIAGNOSIS — I132 Hypertensive heart and chronic kidney disease with heart failure and with stage 5 chronic kidney disease, or end stage renal disease: Secondary | ICD-10-CM | POA: Diagnosis present

## 2022-09-20 DIAGNOSIS — Z951 Presence of aortocoronary bypass graft: Secondary | ICD-10-CM | POA: Diagnosis not present

## 2022-09-20 DIAGNOSIS — N2581 Secondary hyperparathyroidism of renal origin: Secondary | ICD-10-CM | POA: Diagnosis present

## 2022-09-20 DIAGNOSIS — E1122 Type 2 diabetes mellitus with diabetic chronic kidney disease: Secondary | ICD-10-CM | POA: Diagnosis present

## 2022-09-20 LAB — CBC WITH DIFFERENTIAL/PLATELET
Abs Immature Granulocytes: 0.01 10*3/uL (ref 0.00–0.07)
Basophils Absolute: 0 10*3/uL (ref 0.0–0.1)
Basophils Relative: 1 %
Eosinophils Absolute: 0.6 10*3/uL — ABNORMAL HIGH (ref 0.0–0.5)
Eosinophils Relative: 17 %
HCT: 39.7 % (ref 36.0–46.0)
Hemoglobin: 11.9 g/dL — ABNORMAL LOW (ref 12.0–15.0)
Immature Granulocytes: 0 %
Lymphocytes Relative: 17 %
Lymphs Abs: 0.6 10*3/uL — ABNORMAL LOW (ref 0.7–4.0)
MCH: 32.6 pg (ref 26.0–34.0)
MCHC: 30 g/dL (ref 30.0–36.0)
MCV: 108.8 fL — ABNORMAL HIGH (ref 80.0–100.0)
Monocytes Absolute: 0.6 10*3/uL (ref 0.1–1.0)
Monocytes Relative: 16 %
Neutro Abs: 1.8 10*3/uL (ref 1.7–7.7)
Neutrophils Relative %: 49 %
Platelets: 114 10*3/uL — ABNORMAL LOW (ref 150–400)
RBC: 3.65 MIL/uL — ABNORMAL LOW (ref 3.87–5.11)
RDW: 15.4 % (ref 11.5–15.5)
WBC: 3.7 10*3/uL — ABNORMAL LOW (ref 4.0–10.5)
nRBC: 0 % (ref 0.0–0.2)

## 2022-09-20 LAB — COMPREHENSIVE METABOLIC PANEL
ALT: 7 U/L (ref 0–44)
AST: 16 U/L (ref 15–41)
Albumin: 3.2 g/dL — ABNORMAL LOW (ref 3.5–5.0)
Alkaline Phosphatase: 108 U/L (ref 38–126)
Anion gap: 13 (ref 5–15)
BUN: 22 mg/dL (ref 8–23)
CO2: 26 mmol/L (ref 22–32)
Calcium: 8.3 mg/dL — ABNORMAL LOW (ref 8.9–10.3)
Chloride: 95 mmol/L — ABNORMAL LOW (ref 98–111)
Creatinine, Ser: 5.94 mg/dL — ABNORMAL HIGH (ref 0.44–1.00)
GFR, Estimated: 8 mL/min — ABNORMAL LOW (ref >60)
Glucose, Bld: 87 mg/dL (ref 70–99)
Potassium: 4 mmol/L (ref 3.5–5.1)
Sodium: 134 mmol/L — ABNORMAL LOW (ref 135–145)
Total Bilirubin: 0.4 mg/dL (ref 0.3–1.2)
Total Protein: 6.4 g/dL — ABNORMAL LOW (ref 6.5–8.1)

## 2022-09-20 LAB — GLUCOSE, CAPILLARY
Glucose-Capillary: 81 mg/dL (ref 70–99)
Glucose-Capillary: 95 mg/dL (ref 70–99)
Glucose-Capillary: 113 mg/dL — ABNORMAL HIGH (ref 70–99)
Glucose-Capillary: 138 mg/dL — ABNORMAL HIGH (ref 70–99)

## 2022-09-20 LAB — MAGNESIUM: Magnesium: 2 mg/dL (ref 1.7–2.4)

## 2022-09-20 LAB — VITAMIN B12: Vitamin B-12: 609 pg/mL (ref 180–914)

## 2022-09-20 LAB — HEPATITIS B SURFACE ANTIGEN: Hepatitis B Surface Ag: NONREACTIVE

## 2022-09-20 LAB — CBG MONITORING, ED
Glucose-Capillary: 65 mg/dL — ABNORMAL LOW (ref 70–99)
Glucose-Capillary: 93 mg/dL (ref 70–99)

## 2022-09-20 LAB — FOLATE: Folate: 16.1 ng/mL (ref >5.9)

## 2022-09-20 LAB — TSH: TSH: 3.761 u[IU]/mL (ref 0.350–4.500)

## 2022-09-20 LAB — CORTISOL: Cortisol, Plasma: 3.4 ug/dL

## 2022-09-20 LAB — SEDIMENTATION RATE: Sed Rate: 5 mm/hr (ref 0–22)

## 2022-09-20 MED ORDER — ALBUTEROL SULFATE (2.5 MG/3ML) 0.083% IN NEBU: 2.5000 mg | INHALATION_SOLUTION | Freq: Four times a day (QID) | RESPIRATORY_TRACT | Status: DC | PRN

## 2022-09-20 MED ORDER — DEXTROSE 5 % IV SOLN: INTRAVENOUS | Status: AC

## 2022-09-20 MED ORDER — ORAL CARE MOUTH RINSE: 15.0000 mL | OROMUCOSAL | Status: DC | PRN

## 2022-09-20 MED ORDER — ISOSORBIDE MONONITRATE ER 60 MG PO TB24
60.0000 mg | ORAL_TABLET | Freq: Every day | ORAL | Status: DC
  Administered 2022-09-20 – 2022-09-23 (×3): 60 mg via ORAL
  Filled 2022-09-20 (×3): qty 1

## 2022-09-20 MED ORDER — ACETAMINOPHEN 650 MG RE SUPP: 650.0000 mg | Freq: Four times a day (QID) | RECTAL | Status: DC | PRN

## 2022-09-20 MED ORDER — METHYLPREDNISOLONE SODIUM SUCC 40 MG IJ SOLR: 20.0000 mg | Freq: Every day | INTRAMUSCULAR | Status: DC

## 2022-09-20 MED ORDER — CINACALCET HCL 30 MG PO TABS
60.0000 mg | ORAL_TABLET | ORAL | Status: DC
  Administered 2022-09-22: 60 mg via ORAL
  Filled 2022-09-20: qty 2

## 2022-09-20 MED ORDER — SENNOSIDES-DOCUSATE SODIUM 8.6-50 MG PO TABS: 1.0000 | ORAL_TABLET | Freq: Every evening | ORAL | Status: DC | PRN

## 2022-09-20 MED ORDER — BUDESONIDE 0.25 MG/2ML IN SUSP
0.2500 mg | Freq: Two times a day (BID) | RESPIRATORY_TRACT | Status: DC
  Administered 2022-09-21 – 2022-09-23 (×4): 0.25 mg via RESPIRATORY_TRACT
  Filled 2022-09-20 (×5): qty 2

## 2022-09-20 MED ORDER — ACETAMINOPHEN 325 MG PO TABS
650.0000 mg | ORAL_TABLET | Freq: Four times a day (QID) | ORAL | Status: DC | PRN
  Administered 2022-09-20 – 2022-09-22 (×3): 650 mg via ORAL
  Filled 2022-09-20 (×3): qty 2

## 2022-09-20 MED ORDER — DEXTROSE 50 % IV SOLN
INTRAVENOUS | Status: AC
  Administered 2022-09-20: 50 mL via INTRAVENOUS
  Filled 2022-09-20: qty 50

## 2022-09-20 MED ORDER — UMECLIDINIUM BROMIDE 62.5 MCG/ACT IN AEPB
1.0000 | INHALATION_SPRAY | Freq: Every day | RESPIRATORY_TRACT | Status: DC
  Administered 2022-09-21 – 2022-09-23 (×3): 1 via RESPIRATORY_TRACT
  Filled 2022-09-20: qty 7

## 2022-09-20 MED ORDER — LORAZEPAM 1 MG PO TABS
1.0000 mg | ORAL_TABLET | Freq: Once | ORAL | Status: AC
  Administered 2022-09-20: 1 mg via ORAL
  Filled 2022-09-20: qty 1

## 2022-09-20 MED ORDER — ARFORMOTEROL TARTRATE 15 MCG/2ML IN NEBU
15.0000 ug | INHALATION_SOLUTION | Freq: Two times a day (BID) | RESPIRATORY_TRACT | Status: DC
  Administered 2022-09-20 – 2022-09-23 (×5): 15 ug via RESPIRATORY_TRACT
  Filled 2022-09-20 (×6): qty 2

## 2022-09-20 MED ORDER — CHLORHEXIDINE GLUCONATE CLOTH 2 % EX PADS
6.0000 | MEDICATED_PAD | Freq: Every day | CUTANEOUS | Status: DC
  Administered 2022-09-21 – 2022-09-22 (×2): 6 via TOPICAL

## 2022-09-20 MED ORDER — HEPARIN SODIUM (PORCINE) 5000 UNIT/ML IJ SOLN
5000.0000 [IU] | Freq: Three times a day (TID) | INTRAMUSCULAR | Status: DC
  Administered 2022-09-20 – 2022-09-22 (×7): 5000 [IU] via SUBCUTANEOUS
  Filled 2022-09-20 (×7): qty 1

## 2022-09-20 MED ORDER — INSULIN ASPART 100 UNIT/ML IJ SOLN
0.0000 [IU] | INTRAMUSCULAR | Status: DC
  Administered 2022-09-21: 8 [IU] via SUBCUTANEOUS
  Administered 2022-09-21 – 2022-09-22 (×4): 2 [IU] via SUBCUTANEOUS

## 2022-09-20 MED ORDER — DEXTROSE-SODIUM CHLORIDE 5-0.9 % IV SOLN: INTRAVENOUS | Status: DC

## 2022-09-20 MED ORDER — DEXTROSE 5 % IV SOLN: INTRAVENOUS | Status: DC

## 2022-09-20 MED ORDER — METHYLPREDNISOLONE SODIUM SUCC 40 MG IJ SOLR
40.0000 mg | Freq: Every day | INTRAMUSCULAR | Status: DC
  Administered 2022-09-20: 40 mg via INTRAVENOUS
  Filled 2022-09-20: qty 1

## 2022-09-20 MED ORDER — ALLOPURINOL 100 MG PO TABS
100.0000 mg | ORAL_TABLET | Freq: Every evening | ORAL | Status: DC
  Administered 2022-09-20 – 2022-09-22 (×3): 100 mg via ORAL
  Filled 2022-09-20 (×3): qty 1

## 2022-09-20 MED ORDER — CARVEDILOL 12.5 MG PO TABS
25.0000 mg | ORAL_TABLET | Freq: Two times a day (BID) | ORAL | Status: DC
  Administered 2022-09-20 – 2022-09-23 (×6): 25 mg via ORAL
  Filled 2022-09-20 (×6): qty 2

## 2022-09-20 NOTE — Plan of Care (Signed)

## 2022-09-20 NOTE — ED Notes (Signed)
ED TO INPATIENT HANDOFF REPORT  ED Nurse Name and Phone #:  Cheral Almas [phone]  S Name/Age/Gender Jodi Bell 62 y.o. female Room/Bed: 028C/028C  Code Status   Code Status: Prior  Home/SNF/Other Home Patient oriented to: self Is this baseline? No   Triage Complete: Triage complete  Chief Complaint Acute metabolic encephalopathy due to hypoglycemia [G93.41, E16.2]  Triage Note The pt is dialysis pt that had only 2 hours of dialysis today  she was seen at med center this am by one of the ed doctors  she is not acting normal and the son with her reports that his sister was with her this am but the sister had to go out of town  cbg was low earlier today  The ps has seen in triage she will be placing orders on this pt   Allergies Allergies  Allergen Reactions   3-Methyl-2-Benzothiazolinone Hydrazone Other (See Comments)    Muscle weakness muscle cramping in legs Other reaction(s): Myalgias (Muscle Pain)   Banana Anaphylaxis, Swelling and Other (See Comments)    TONGUE AND MOUTH SWELLING   Black Walnut Flavor Anaphylaxis and Itching    Walnuts   Hazelnut (Filbert) Anaphylaxis    Hazelnuts   Leflunomide And Related Other (See Comments)    Severe Headache   Lisinopril Swelling    Angioedema  Swelling of the face   No [employer] Food Allergies Anaphylaxis    Spicy Mustard 4.7.2021 Pt reports that she eats Regular Yellow Mustard Swelling and itching of tongue   Other Other (See Comments), Anaphylaxis and Swelling    Cosentyx= angioedema  Other reaction(s): Facial Edema (intolerance) Mouth itches and tongue swelling Hazel nuts and pecans also Walnuts Walnuts Renal failure Other reaction(s): Other Serotonin Syndrome  Serotonin syndrome  Tremors Other reaction(s): Other Tremors Muscle weakness muscle cramping in legs Other reaction(s): Myalgias (Muscle Pain) Mouth itches and tongue swelling Hazel nuts and pecans also Walnuts    Pecan Nut  (Diagnostic) Anaphylaxis and Itching    Pecans   Trazodone And Nefazodone Other (See Comments)    Serotonin Syndrome  Tremors   Adalimumab Other (See Comments)    Blisters on abdomen =humira   Escitalopram Oxalate Other (See Comments)    Hand problems - Serotonin Syndrome     Ezetimibe Other (See Comments)    myalgias cramps    Secukinumab Swelling    Cosentyx = angiodema    Statins Other (See Comments)    Leg pains and weakness   Ferrlecit [Na Ferric Gluc Cplx In Sucrose]     Not reaction given from HD   Gabapentin Other (See Comments)    tremors   Nsaids     Avoid due to renal problems     Level of Care/Admitting Diagnosis ED Disposition     ED Disposition  Admit   Condition  --   Comment  Hospital Area: [facility] [[id number]]  Level of Care: Telemetry Medical [[id number]]  May place patient in observation at [facility] or [facility] if equivalent level of care is available:: No  Covid Evaluation: Confirmed COVID Negative  Diagnosis: Acute metabolic encephalopathy due to hypoglycemia [[id number]]  Admitting Physician: Gery Pray [[id number]]  Attending Physician: Gery Pray [[id number]]          B Medical/Surgery History Past Medical History:  Diagnosis Date   Anemia of chronic disease    Anxiety    Arthritis    oa and psoriatic ra   Asthma    Back  pain, chronic    lower back   Chronic insomnia    COPD (chronic obstructive pulmonary disease) (HCC)    Diabetes mellitus    type 2   ESRD on dialysis [facility])    Essential hypertension    GERD (gastroesophageal reflux disease)    Gout    History of hiatal hernia    Hypoalbuminemia    Hyponatremia    Mild aortic stenosis    Mitral regurgitation    Mitral stenosis    Obesity    PAD (peripheral artery disease) (HCC)    a. externial iliac calcification seen on CT 04/2020.   Sleep apnea    Tobacco abuse    Upper GI bleed 05/2019   Past Surgical History:  Procedure Laterality Date   BACK SURGERY   2016   lower back fusion with cage   BIOPSY  09/23/2017   Procedure: BIOPSY;  Surgeon: Graylin Shiver, MD;  Location: [facility] ENDOSCOPY;  Service: Endoscopy;;   BIOPSY  05/19/2019   Procedure: BIOPSY;  Surgeon: Kathi Der, MD;  Location: [facility] ENDOSCOPY;  Service: Gastroenterology;;   CESAREAN SECTION  1990   x 1    COLONOSCOPY WITH PROPOFOL N/A 09/23/2017   Procedure: COLONOSCOPY WITH PROPOFOL Hemostatic clips placed;  Surgeon: Graylin Shiver, MD;  Location: [facility] ENDOSCOPY;  Service: Endoscopy;  Laterality: N/A;   COLONOSCOPY WITH PROPOFOL N/A 10/02/2020   Procedure: COLONOSCOPY WITH PROPOFOL;  Surgeon: Willis Modena, MD;  Location: [facility] ENDOSCOPY;  Service: Endoscopy;  Laterality: N/A;   CORONARY ARTERY BYPASS GRAFT N/A 09/13/2020   Procedure: CORONARY ARTERY BYPASS GRAFTING (CABG), ON PUMP, TIMES TWO, USING LEFT INTERNAL MAMMARY ARTERY AND RIGHT ENDOSCOPICALLY HARVESTED GREATER SAPHENOUS VEIN;  Surgeon: Alleen Borne, MD;  Location: [facility] OR;  Service: Open Heart Surgery;  Laterality: N/A;   DILATION AND CURETTAGE OF UTERUS  1988   ENTEROSCOPY N/A 07/22/2021   Procedure: ENTEROSCOPY;  Surgeon: Kerin Salen, MD;  Location: [facility] ENDOSCOPY;  Service: Gastroenterology;  Laterality: N/A;   ESOPHAGOGASTRODUODENOSCOPY N/A 09/22/2020   Procedure: ESOPHAGOGASTRODUODENOSCOPY (EGD);  Surgeon: Willis Modena, MD;  Location: [facility] ENDOSCOPY;  Service: Endoscopy;  Laterality: N/A;   ESOPHAGOGASTRODUODENOSCOPY (EGD) WITH PROPOFOL N/A 09/23/2017   Procedure: ESOPHAGOGASTRODUODENOSCOPY (EGD) WITH PROPOFOL;  Surgeon: Graylin Shiver, MD;  Location: [facility] ENDOS[facility];  Service: Endoscopy;  Laterality: N/A;   ESOPHAGOGASTRODUODENOSCOPY (EGD) WITH PROPOFOL N/A 05/19/2019   Procedure: ESOPHAGOGASTRODUODENOSCOPY (EGD) WITH PROPOFOL;  Surgeon: Kathi Der, MD;  [facility];  Service: Gastroenterology;  Laterality: N/A;   ESOPHAGOGASTRODUODENOSCOPY (EGD) WITH PROPOFOL N/A 10/02/2020   Procedure: ESOPHAGOGASTRODUODENOSCOPY  (EGD) WITH PROPOFOL;  Surgeon: Willis Modena, MD;  Location: [facility] ENDOSCOPY;  Service: Endoscopy;  Laterality: N/A;   GIVENS CAPSULE STUDY N/A 05/19/2019   Procedure: GIVENS CAPSULE STUDY;  Surgeon: Kathi Der, MD;  Location: [facility] ENDOS[facility];  Service: Gastroenterology;  Laterality: N/A;   GIVENS CAPSULE STUDY N/A 07/19/2021   Procedure: GIVENS CAPSULE STUDY;  Surgeon: [name], MD;  Location: [facility] ENDOSCOPY;  Service: Gastroenterology;  Laterality: N/A;   HEMOSTASIS CLIP PLACEMENT  09/22/2020   Procedure: HEMOSTASIS CLIP PLACEMENT;  Surgeon: Willis Modena, MD;  [facility];  Service: Endoscopy;;   HEMOSTASIS CONTROL  09/22/2020   Procedure: HEMOSTASIS CONTROL;  Surgeon: Willis Modena, MD;  Location: [facility] ENDOSCOPY;  Service: Endoscopy;;   HOT HEMOSTASIS N/A 05/19/2019   Procedure: HOT HEMOSTASIS (ARGON PLASMA COAGULATION/BICAP);  Surgeon: Kathi Der, MD;  Location: [facility] ENDOSCOPY;  Service: Gastroenterology;  Laterality: N/A;   LEFT HEART CATH AND CORONARY ANGIOGRAPHY  N/A 09/10/2020   Procedure: LEFT HEART CATH AND CORONARY ANGIOGRAPHY;  Surgeon: Yvonne Kendall, MD;  [facility] INVASIVE CV LAB;  Service: Cardiovascular;  Laterality: N/A;   PARTIAL KNEE ARTHROPLASTY Left 11/12/2017   Procedure: left unicompartmental arthroplasty-medial;  Surgeon: Durene Romans, MD;  Location: [facility] ORS;  Service: Orthopedics;  Laterality: Left;    RIGHT HEART CATH AND CORONARY/GRAFT ANGIOGRAPHY N/A 06/26/2022   Procedure: RIGHT HEART CATH AND CORONARY/GRAFT ANGIOGRAPHY;  Surgeon: [name], MD;  [facility] INVASIVE CV LAB;  Service: Cardiovascular;  Laterality: N/A;   SUBMUCOSAL INJECTION  09/23/2017   Procedure: SUBMUCOSAL INJECTION;  Surgeon: Graylin Shiver, MD;  Location: [facility] ENDOS[facility];  Service: Endoscopy;;  in colon   TEE WITHOUT CARDIOVERSION N/A 09/13/2020   Procedure: TRANSESOPHAGEAL ECHOCARDIOGRAM (TEE);  Surgeon: Alleen Borne, MD;  Location: [facility] OR;  Service: Open Heart Surgery;   Laterality: N/A;   TUBAL LIGATION  1990     A IV Location/Drains/Wounds Patient Lines/Drains/Airways Status     Active Line/Drains/Airways     Name Placement date Placement time Site Days   Peripheral IV 09/19/22 20 G Right Antecubital 09/19/22  2113  Antecubital  1   Fistula / Graft Left Upper arm 05/17/18  0800  Upper arm  1587            Intake/Output Last 24 hours No intake or output data in the 24 hours ending 09/20/22 0515  Labs/Imaging Results for orders placed or performed during the hospital encounter of 09/19/22 (from the past 48 hour(s))  CBG monitoring, ED     Status: None   Collection Time: 09/19/22  5:37 PM  Result Value Ref Range   Glucose-Capillary 72 70 - 99 mg/dL    Comment: Glucose reference range applies only to samples taken after fasting for at least 8 hours.  POC CBG, ED     Status: Abnormal   Collection Time: 09/19/22  9:02 PM  Result Value Ref Range   Glucose-Capillary 59 (L) 70 - 99 mg/dL    Comment: Glucose reference range applies only to samples taken after fasting for at least 8 hours.  Basic metabolic panel     Status: Abnormal   Collection Time: 09/19/22  9:16 PM  Result Value Ref Range   Sodium 134 (L) 135 - 145 mmol/L   Potassium 4.0 3.5 - 5.1 mmol/L   Chloride 100 98 - 111 mmol/L   CO2 24 22 - 32 mmol/L   Glucose, Bld 61 (L) 70 - 99 mg/dL    Comment: Glucose reference range applies only to samples taken after fasting for at least 8 hours.   BUN 22 8 - 23 mg/dL   Creatinine, Ser 5.57 (H) 0.44 - 1.00 mg/dL   Calcium 8.7 (L) 8.9 - 10.3 mg/dL   GFR, Estimated 9 (L) >60 mL/min    Comment: (NOTE) Calculated using the CKD-EPI Creatinine Equation (2021)    Anion gap 10 5 - 15    Comment: Performed at [facility], [address] [place], [place] [address]  CBC     Status: Abnormal   Collection Time: 09/19/22  9:16 PM  Result Value Ref Range   WBC 4.4 4.0 - 10.5 K/uL   RBC 3.69 (L) 3.87 - 5.11 MIL/uL   Hemoglobin 11.9 (L) 12.0  - 15.0 g/dL   HCT 54.2 70.6 - 23.7 %   MCV 110.3 (H) 80.0 - 100.0 fL   MCH 32.2 26.0 - 34.0 pg   MCHC  29.2 (L) 30.0 - 36.0 g/dL   RDW 16.1 09.6 - 04.5 %   Platelets 121 (L) 150 - 400 K/uL    Comment: REPEATED TO VERIFY   nRBC 0.0 0.0 - 0.2 %    Comment: Performed at [facility], [address], [place], [place] [address]  POC CBG, ED     Status: Abnormal   Collection Time: 09/19/22  9:36 PM  Result Value Ref Range   Glucose-Capillary 155 (H) 70 - 99 mg/dL    Comment: Glucose reference range applies only to samples taken after fasting for at least 8 hours.  Ammonia     Status: None   Collection Time: 09/19/22 10:25 PM  Result Value Ref Range   Ammonia 32 9 - 35 umol/L    Comment: Performed at [facility], [address], [place], [place] [address]  CBG monitoring, ED     Status: Abnormal   Collection Time: 09/19/22 10:25 PM  Result Value Ref Range   Glucose-Capillary 103 (H) 70 - 99 mg/dL    Comment: Glucose reference range applies only to samples taken after fasting for at least 8 hours.  I-Stat Lactic Acid     Status: Abnormal   Collection Time: 09/19/22 10:32 PM  Result Value Ref Range   Lactic Acid, Venous 0.4 (L) 0.5 - 1.9 mmol/L  SARS Coronavirus 2 by RT PCR (hospital order, performed in [facility] hospital lab) *cepheid single result test* Anterior Nasal Swab     Status: None   Collection Time: 09/19/22 11:29 PM   Specimen: Anterior Nasal Swab  Result Value Ref Range   SARS Coronavirus 2 by RT PCR NEGATIVE NEGATIVE    Comment: Performed at [facility], [address], [place], [place] [address]  POC CBG, ED     Status: None   Collection Time: 09/19/22 11:37 PM  Result Value Ref Range   Glucose-Capillary 93 70 - 99 mg/dL    Comment: Glucose reference range applies only to samples taken after fasting for at least 8 hours.  Hemoglobin A1c     Status: Abnormal   Collection Time: 09/20/22 12:17 AM  Result Value Ref Range   Hgb A1c MFr Bld 4.5 (L) 4.8  - 5.6 %    Comment: (NOTE) Pre diabetes:          5.7%-6.4%  Diabetes:              >6.4%  Glycemic control for   <7.0% adults with diabetes    Mean Plasma Glucose 82.45 mg/dL    Comment: Performed at [facility], [address] [place], [place] [address]  I-Stat Lactic Acid     Status: None   Collection Time: 09/20/22 12:29 AM  Result Value Ref Range   Lactic Acid, Venous 0.5 0.5 - 1.9 mmol/L  POC CBG, ED     Status: None   Collection Time: 09/20/22  1:45 AM  Result Value Ref Range   Glucose-Capillary 82 70 - 99 mg/dL    Comment: Glucose reference range applies only to samples taken after fasting for at least 8 hours.  POC CBG, ED     Status: Abnormal   Collection Time: 09/20/22  4:36 AM  Result Value Ref Range   Glucose-Capillary 65 (L) 70 - 99 mg/dL    Comment: Glucose reference range applies only to samples taken after fasting for at least 8 hours.   DG CHEST PORT 1 VIEW  Result  Date: 09/19/2022 CLINICAL DATA:  Confusion EXAM: PORTABLE CHEST 1 VIEW COMPARISON:  CT chest dated 07/24/2022 FINDINGS: Increased interstitial markings, favoring mild central interstitial edema. No definite pleural effusions. No pneumothorax. Cardiomegaly.  Postsurgical changes related to prior CABG. Median sternotomy. Cervical spine fixation hardware, incompletely visualized. IMPRESSION: Cardiomegaly with mild central interstitial edema. Electronically Signed   By: Charline Bills M.D.   On: 09/19/2022 23:33   CT ABDOMEN PELVIS W CONTRAST  Result Date: 09/19/2022 CLINICAL DATA:  Dialysis patient with developing abdominal pain and nausea. EXAM: CT ABDOMEN AND PELVIS WITH CONTRAST TECHNIQUE: Multidetector CT imaging of the abdomen and pelvis was performed using the standard protocol following bolus administration of intravenous contrast. RADIATION DOSE REDUCTION: This exam was performed according to the departmental dose-optimization program which includes automated exposure control, adjustment of  the mA and/or kV according to patient size and/or use of iterative reconstruction technique. CONTRAST:  75mL OMNIPAQUE IOHEXOL 300 MG/ML  SOLN COMPARISON:  CT angiogram 07/24/2022. Multiple older CT examinations. FINDINGS: Lower chest: Heart is diffusely enlarged. Coronary artery calcifications are seen. Small pericardial effusion. Trace pleural fluid. Enlargement of the pulmonary vessels. Hepatobiliary: Contrast bolus is more late arterial. Incomplete opacification of the hepatic veins. Fatty liver infiltration identified. Distended gallbladder with wall thickening. Patent portal vein. Pancreas: Unremarkable. No pancreatic ductal dilatation or surrounding inflammatory changes. Spleen: Spleen is nonenlarged. There is low-attenuation lesion along the inferior aspect of the spleen measuring 7 mm on series 2, image 30. Too small to completely characterize but unchanged going back to multiple prior examinations. Nonaggressive in appearance. No additional follow-up. Adrenals/Urinary Tract: The adrenal glands are preserved. There is moderate atrophy of the kidneys with the extensive vascular calcifications extending into the hilum. 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 No specific imaging follow-up. No clearly enhancing mass at this time. The ureters have normal course and caliber extending down to the bladder. Bladder is contracted. Stomach/Bowel: No oral contrast. The stomach is mildly distended with fluid. The small bowel is nondilated diffusely. Normal appendix in the right lower quadrant. The large bowel demonstrates diffuse colonic diverticula. There is some wall thickening along the transverse colon. A colitis is possible. Colon is nondilated. Vascular/Lymphatic: Diffuse vascular calcifications identified. Normal caliber aorta and IVC. There are some enlarged retroperitoneal nodes identified. Example left para-on series 2, image 31 measures 19 by 16 mm. This was seen previously in  retrospect and is unchanged. Further smaller nodes identified elsewhere along the retroperitoneum. Reproductive: Uterus and bilateral adnexa are unremarkable. Other: Increasing anasarca.  Mesenteric haziness and mild ascites. Musculoskeletal: Significant streak artifact related to the spinal fixation hardware along the lumbar spine with stable compression deformities stable endplate irregularity at the L2-3 level. Diffuse bony sclerosis identified related to the patient's known history of chronic renal failure. Multilevel stenosis along the spine. IMPRESSION: Developing wall thickening along the transverse colon. Possible colitis. Increasing stranding, ascites and anasarca. Extensive colonic diverticula. Tiny right pleural effusion. Distended gallbladder with some wall thickening. Please correlate for any clinical symptoms of acute gallbladder pathology and if needed follow up gallbladder ultrasound. Stable mildly enlarged retroperitoneal lymph node. Moderate renal atrophy. Electronically Signed   By: Karen Kays M.D.   On: 09/19/2022 11:56    Pending Labs Unresulted Labs (From admission, onward)     Start     Ordered   09/19/22 2336  Rapid urine drug screen (hospital performed)  ONCE - STAT,   STAT  09/19/22 2335   09/19/22 2325  Urinalysis, w/ Reflex to Culture (Infection Suspected) -Urine, Catheterized  (Urine Labs)  Once,   URGENT       Question:  Specimen Source  Answer:  Urine, Catheterized   09/19/22 2324            Vitals/Pain Today's Vitals   09/20/22 0400 09/20/22 0415 09/20/22 0445 09/20/22 0500  BP: (!) 141/73 131/69 129/62   Pulse: 84 81 80 80  Resp: 20 20  10   Temp:      TempSrc:      SpO2: 98% 98% 99% 99%  Weight:      Height:      PainSc:        Isolation Precautions Airborne and Contact precautions  Medications Medications  insulin aspart (novoLOG) injection 0-15 Units ( Subcutaneous Not Given 09/20/22 0437)  dextrose 5 %-0.9 % sodium chloride infusion (  Intravenous New Bag/Given 09/20/22 0504)  dextrose 50 % solution 50 mL (50 mLs Intravenous Given 09/20/22 0500)    Mobility walks with person assist     Focused Assessments Neuro Assessment Handoff:  Swallow screen pass? No          Neuro Assessment: Within Defined Limits Neuro Checks:      Has TPA been given? No If patient is a Neuro Trauma and patient is going to OR before floor call report to 4N Charge nurse: [phone] or [phone]  , Renal Assessment Handoff:  Hemodialysis Schedule: Hemodialysis Schedule: Monday/Wednesday/Friday Last Hemodialysis date and time: UTA   Restricted appendage: left arm   R Recommendations: See Admitting Provider Note  Report given to:   Additional Notes: pt is lethargic and wakes to touch

## 2022-09-20 NOTE — Progress Notes (Signed)
PROGRESS NOTE Jodi Bell  NWG:[id number] DOB: Jan 30, 1961 DOA: 09/19/2022 PCP: Malka So., MD  Brief Narrative/Hospital Course: 41yof w/ ESRD, CAD, HTN, anemia, chronic back pain, GERD, COPD, chronic respiratory failure, OSA, PAF, chronic diastolic heart failure presented to the ED for evaluation of confusion, abdominal pain. 2 days PTA patient took her oxycodone, and 1 day PTA took her Ativan. Daughter reports patient had similar confusion in the past in May at [facility] UTI encephalopathy and severe symptomatic anemia needing transfusion.  Per daughter patient may be a little urine, has eaten very little in past 24 hours PTA had headache too. CT abdomen pelvis with contrast at noon> developing wall thickening along the transverse colon possible colitis increasing stranding ascites and anasarca extensive colonic diverticuli, distended gallbladder with some wall thickening-advised to correlate for any clinical symptoms of acute gallbladder pathology. Patient was treated with dextrose pain medication nausea medication. patient was discharged.  Patient went to her scheduled hemodialysis.  She had only 2 hours of hemodialysis today as patient's behavior remained altered.  When her son picked her up patient was listless, confused and fatigued.  She continued to complain of abdominal pain. She was arousable and had persistent hypoglycemia ,Hemoglobin A1c 4.5.Chest x-ray at 23:33> with cardiomegaly with interstitial edema. MRI head ordered and admitted for further management.    Subjective: Seen and examined Sleeping-woke up and able to follow commands daughter and SIL at bedside Much more alert awake., drank some ensure and talked to familyu this am Denies dysurea. Some abd fullness Reports taking ativan, flexeril in am but no pain meds  Assessment and Plan: Principal Problem:   Acute metabolic encephalopathy due to hypoglycemia Active Problems:   Essential hypertension   Psoriasis  arthropathica (HCC)   COPD (chronic obstructive pulmonary disease) (HCC)   Tobacco use   ESRD (end stage renal disease) (HCC)   OSA (obstructive sleep apnea)   Chronic diastolic CHF (congestive heart failure) (HCC)   Paroxysmal atrial fibrillation (HCC)   Chronic respiratory failure with hypoxia (HCC)   Acute metabolic encephalopathy   Uncontrolled type 2 diabetes mellitus with hypoglycemia, with long-term current use of insulin (HCC)   Acute metabolic encephalopathy: Likely due to hypoglycemia.  Continue to address blood sugar, rule out other etiology follow-up TSH ammonia B12 MRI brain.  Keep on delirium precaution fall precaution PT OT as able.  Concern for polypharmacy UTI, check UA as able In-N-Out cath was attempted and unsuccessful.  Hypoglycemia: Likely from poor oral intake, check random serum cortisol.  Changed to D5 and q2hr cbg> once able to take p.o. will discontinue IV fluids given ESRD status with minimal urine output.  Cortisol will likely be suppressed given patient's chronic steroid use Recent Labs  Lab 09/19/22 2337 09/20/22 0017 09/20/22 0145 09/20/22 0436 09/20/22 0507 09/20/22 0931  GLUCAP 93  --  82 65* 93 81  HGBA1C  --  4.5*  --   --   --   --     Chronic diastolic CHF ESRD on HD on MWF: Nephro consulted for HD monitor fluid status currently euvolemic, nephro planned extra HD today  Chronic respiratory failure with hypoxia COPD: Patient on chronic steroid currently on IV, continue Brovana, Incruse Ellipta and Pulmicort, as needed nebulizer  PAF: Continue Coreg.  Not on anticoagulation  Psoriatic arthropathy On daily prednisone, now on IV steroids  Thrombocytopenia: On lower side, monitor Recent Labs  Lab 09/19/22 0916 09/19/22 2116 09/20/22 0906  PLT 129* 121* 114*  Deconditioning/debility: Request PT OT evaluation for disposition  DVT prophylaxis: heparin injection 5,000 Units Start: 09/20/22 0745 Code Status:   Code Status: Full  Code Family Communication: plan of care discussed with patient/daughter at bedside. Patient status is: admitted as observation but remains hospitalized for ongoing  because of AMS Level of care: Telemetry Medical   Dispo: The patient is from: HOME WITH DAUGHTER            Anticipated disposition: TBD Objective: Vitals last 24 hrs: Vitals:   09/20/22 0539 09/20/22 0603 09/20/22 0734 09/20/22 0940  BP:  (!) 143/67  (!) 147/71  Pulse:  80 80 79  Resp:  18 16   Temp: 98.3 F (36.8 C) 98.3 F (36.8 C)  98 F (36.7 C)  TempSrc: Oral Oral  Oral  SpO2:  100%  99%  Weight:      Height:       Weight change:   Physical Examination: General exam: alert awake, older than stated age HEENT:Oral mucosa moist, Ear/Nose WNL grossly Respiratory system: bilaterally CLEAR BS, no use of accessory muscle Cardiovascular system: S1 & S2 +, No JVD. Gastrointestinal system: Abdomen soft,NT,ND, BS+ Nervous System:Alert, awake, moving extremities. Extremities: LE edema neg,distal peripheral pulses palpable.  Skin: No rashes,no icterus. MSK: Normal muscle bulk,tone, power  Medications reviewed:  Scheduled Meds:  allopurinol  100 mg Oral QPM   arformoterol  15 mcg Nebulization BID   And   umeclidinium bromide  1 puff Inhalation Daily   carvedilol  25 mg Oral BID WC   [START ON 09/22/2022] cinacalcet  60 mg Oral Q M,W,F-HD   heparin  5,000 Units Subcutaneous Q8H   insulin aspart  0-15 Units Subcutaneous Q2H   isosorbide mononitrate  60 mg Oral Daily   methylPREDNISolone (SOLU-MEDROL) injection  40 mg Intravenous Daily   Continuous Infusions:  dextrose 50 mL/hr at 09/20/22 0865    Diet Order             Diet heart healthy/carb modified Room service appropriate? Yes; Fluid consistency: Thin  Diet effective now                   No intake or output data in the 24 hours ending 09/20/22 1042 Net IO Since Admission: No IO data has been entered for this period [09/20/22 1042]  Wt Readings  from Last 3 Encounters:  09/19/22 61 kg  09/19/22 61 kg  08/21/22 61 kg     Unresulted Labs (From admission, onward)     Start     Ordered   09/20/22 0950  Sedimentation rate  Once,   R        09/20/22 0950   09/20/22 0718  RPR  Once,   R        09/20/22 0718   09/20/22 0658  CBC with Differential/Platelet  Once,   R        09/20/22 0657   09/19/22 2325  Urinalysis, w/ Reflex to Culture (Infection Suspected) -Urine, Catheterized  (Urine Labs)  Once,   URGENT       Question:  Specimen Source  Answer:  Urine, Catheterized   09/19/22 2324          Data Reviewed: I have personally reviewed following labs and imaging studies CBC: Recent Labs  Lab 09/19/22 0916 09/19/22 2116 09/20/22 0906  WBC 3.7* 4.4 3.7*  NEUTROABS  --   --  1.8  HGB 12.8 11.9* 11.9*  HCT 42.8 40.7 39.7  MCV  105.7* 110.3* 108.8*  PLT 129* 121* 114*   Basic Metabolic Panel: Recent Labs  Lab 09/19/22 0916 09/19/22 2116 09/20/22 0749  NA 137 134* 134*  K 4.2 4.0 4.0  CL 99 100 95*  CO2 25 24 26   GLUCOSE 41* 61* 87  BUN 33* 22 22  CREATININE 6.53* 5.15* 5.94*  CALCIUM 9.5 8.7* 8.3*  MG  --   --  2.0   GFR: Estimated Creatinine Clearance: 8.1 mL/min (A) (by C-G formula based on SCr of 5.94 mg/dL (H)). Liver Function Tests: Recent Labs  Lab 09/19/22 0916 09/20/22 0749  AST 16 16  ALT 5 7  ALKPHOS 130* 108  BILITOT 0.4 0.4  PROT 6.9 6.4*  ALBUMIN 4.0 3.2*   Recent Labs  Lab 09/19/22 0916  LIPASE 28   Recent Labs  Lab 09/19/22 2225  AMMONIA 32   Recent Labs    09/20/22 0017  HGBA1C 4.5*   Recent Labs    09/20/22 0749  TSH 3.761   Sepsis Labs: Recent Labs  Lab 09/19/22 2232 09/20/22 0029  LATICACIDVEN 0.4* 0.5   Recent Results (from the past 240 hour(s))  SARS Coronavirus 2 by RT PCR (hospital order, performed in [facility] hospital lab) *cepheid single result test* Anterior Nasal Swab     Status: None   Collection Time: 09/19/22 11:29 PM   Specimen: Anterior Nasal  Swab  Result Value Ref Range Status   SARS Coronavirus 2 by RT PCR NEGATIVE NEGATIVE Final    Comment: Performed at [facility], [address] [place], [place] [address]    Antimicrobials: Anti-infectives (From admission, onward)    None      Culture/Microbiology    Component Value Date/Time   SDES BLOOD SITE NOT SPECIFIED 06/26/2021 0650   SPECREQUEST  06/26/2021 0650    BOTTLES DRAWN AEROBIC AND ANAEROBIC Blood Culture results may not be optimal due to an excessive volume of blood received in culture bottles   CULT  06/26/2021 0650    NO GROWTH 5 DAYS Performed at [facility], [address], [place], [place] [address]    REPTSTATUS 07/01/2021 FINAL 06/26/2021 0650  Radiology Studies: DG CHEST PORT 1 VIEW  Result Date: 09/19/2022 CLINICAL DATA:  Confusion EXAM: PORTABLE CHEST 1 VIEW COMPARISON:  CT chest dated 07/24/2022 FINDINGS: Increased interstitial markings, favoring mild central interstitial edema. No definite pleural effusions. No pneumothorax. Cardiomegaly.  Postsurgical changes related to prior CABG. Median sternotomy. Cervical spine fixation hardware, incompletely visualized. IMPRESSION: Cardiomegaly with mild central interstitial edema. Electronically Signed   By: Charline Bills M.D.   On: 09/19/2022 23:33   CT ABDOMEN PELVIS W CONTRAST  Result Date: 09/19/2022 CLINICAL DATA:  Dialysis patient with developing abdominal pain and nausea. EXAM: CT ABDOMEN AND PELVIS WITH CONTRAST TECHNIQUE: Multidetector CT imaging of the abdomen and pelvis was performed using the standard protocol following bolus administration of intravenous contrast. RADIATION DOSE REDUCTION: This exam was performed according to the departmental dose-optimization program which includes automated exposure control, adjustment of the mA and/or kV according to patient size and/or use of iterative reconstruction technique. CONTRAST:  75mL OMNIPAQUE IOHEXOL 300 MG/ML  SOLN COMPARISON:  CT  angiogram 07/24/2022. Multiple older CT examinations. FINDINGS: Lower chest: Heart is diffusely enlarged. Coronary artery calcifications are seen. Small pericardial effusion. Trace pleural fluid. Enlargement of the pulmonary vessels. Hepatobiliary: Contrast bolus is more late arterial. Incomplete opacification of the hepatic veins. Fatty liver infiltration identified. Distended gallbladder with wall thickening. Patent  portal vein. Pancreas: Unremarkable. No pancreatic ductal dilatation or surrounding inflammatory changes. Spleen: Spleen is nonenlarged. There is low-attenuation lesion along the inferior aspect of the spleen measuring 7 mm on series 2, image 30. Too small to completely characterize but unchanged going back to multiple prior examinations. Nonaggressive in appearance. No additional follow-up. Adrenals/Urinary Tract: The adrenal glands are preserved. There is moderate atrophy of the kidneys with the extensive vascular calcifications extending into the hilum. 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 No specific imaging follow-up. No clearly enhancing mass at this time. The ureters have normal course and caliber extending down to the bladder. Bladder is contracted. Stomach/Bowel: No oral contrast. The stomach is mildly distended with fluid. The small bowel is nondilated diffusely. Normal appendix in the right lower quadrant. The large bowel demonstrates diffuse colonic diverticula. There is some wall thickening along the transverse colon. A colitis is possible. Colon is nondilated. Vascular/Lymphatic: Diffuse vascular calcifications identified. Normal caliber aorta and IVC. There are some enlarged retroperitoneal nodes identified. Example left para-on series 2, image 31 measures 19 by 16 mm. This was seen previously in retrospect and is unchanged. Further smaller nodes identified elsewhere along the retroperitoneum. Reproductive: Uterus and bilateral adnexa are unremarkable.  Other: Increasing anasarca.  Mesenteric haziness and mild ascites. Musculoskeletal: Significant streak artifact related to the spinal fixation hardware along the lumbar spine with stable compression deformities stable endplate irregularity at the L2-3 level. Diffuse bony sclerosis identified related to the patient's known history of chronic renal failure. Multilevel stenosis along the spine. IMPRESSION: Developing wall thickening along the transverse colon. Possible colitis. Increasing stranding, ascites and anasarca. Extensive colonic diverticula. Tiny right pleural effusion. Distended gallbladder with some wall thickening. Please correlate for any clinical symptoms of acute gallbladder pathology and if needed follow up gallbladder ultrasound. Stable mildly enlarged retroperitoneal lymph node. Moderate renal atrophy. Electronically Signed   By: Karen Kays M.D.   On: 09/19/2022 11:56     LOS: 0 days   Lanae Boast, MD [facility]  09/20/2022, 10:42 AM

## 2022-09-20 NOTE — Consult Note (Signed)
Renal Service Consult Note [facility]  [facility] 09/20/2022 Jodi Krabbe, MD Requesting Physician: Dr. Jonathon Bellows  Reason for Consult: ESRD pt w/ AMS HPI: The patient is a 62 y.o. year-old w/ PMH as below who presented to ED w/ altered mental status yesterday am. W/u showed hypoglycemia and she improved and was dc'd home. She had 2hrs HD in the afternoon then again had altered mental status and she presented to ED again in the evening. She c/o abd pain, also per family possibly N/V, chills, fever. In ED VSS, afeb, pt was confused. BS 59, rec'd IV amp D50. WBC 3K. CXR showed early IS edema. MRI head ordered and pt was admitted. We are asked to see for esrd.   Pt seen in room. Pt remains altered, quiet but not oriented fully.  Denies acute SOB, CP or abd pain at this time.   ROS - denies CP, no joint pain, no HA, no blurry vision, no rash, no diarrhea, no nausea/ vomiting, no dysuria, no difficulty voiding   Past Medical History  Past Medical History:  Diagnosis Date   Anemia of chronic disease    Anxiety    Arthritis    oa and psoriatic ra   Asthma    Back pain, chronic    lower back   Chronic insomnia    COPD (chronic obstructive pulmonary disease) (HCC)    Diabetes mellitus    type 2   ESRD on dialysis [facility])    Essential hypertension    GERD (gastroesophageal reflux disease)    Gout    History of hiatal hernia    Hypoalbuminemia    Hyponatremia    Mild aortic stenosis    Mitral regurgitation    Mitral stenosis    Obesity    PAD (peripheral artery disease) (HCC)    a. externial iliac calcification seen on CT 04/2020.   Sleep apnea    Tobacco abuse    Upper GI bleed 05/2019   Past Surgical History  Past Surgical History:  Procedure Laterality Date   BACK SURGERY  2016   lower back fusion with cage   BIOPSY  09/23/2017   Procedure: BIOPSY;  Surgeon: Graylin Shiver, MD;  Location: [facility] ENDOSCOPY;  Service: Endoscopy;;   BIOPSY  05/19/2019   Procedure:  BIOPSY;  Surgeon: Kathi Der, MD;  Location: [facility] ENDOSCOPY;  Service: Gastroenterology;;   CESAREAN SECTION  1990   x 1    COLONOSCOPY WITH PROPOFOL N/A 09/23/2017   Procedure: COLONOSCOPY WITH PROPOFOL Hemostatic clips placed;  Surgeon: Graylin Shiver, MD;  Location: [facility] ENDOSCOPY;  Service: Endoscopy;  Laterality: N/A;   COLONOSCOPY WITH PROPOFOL N/A 10/02/2020   Procedure: COLONOSCOPY WITH PROPOFOL;  Surgeon: Willis Modena, MD;  Location: [facility] ENDOSCOPY;  Service: Endoscopy;  Laterality: N/A;   CORONARY ARTERY BYPASS GRAFT N/A 09/13/2020   Procedure: CORONARY ARTERY BYPASS GRAFTING (CABG), ON PUMP, TIMES TWO, USING LEFT INTERNAL MAMMARY ARTERY AND RIGHT ENDOSCOPICALLY HARVESTED GREATER SAPHENOUS VEIN;  Surgeon: Alleen Borne, MD;  Location: [facility] OR;  Service: Open Heart Surgery;  Laterality: N/A;   DILATION AND CURETTAGE OF UTERUS  1988   ENTEROSCOPY N/A 07/22/2021   Procedure: ENTEROSCOPY;  Surgeon: Kerin Salen, MD;  Location: [facility] ENDOSCOPY;  Service: Gastroenterology;  Laterality: N/A;   ESOPHAGOGASTRODUODENOSCOPY N/A 09/22/2020   Procedure: ESOPHAGOGASTRODUODENOSCOPY (EGD);  Surgeon: Willis Modena, MD;  Location[facility] ENDOSCOPY;  Service: Endoscopy;  Laterality: N/A;   ESOPHAGOGASTRODUODENOSCOPY (EGD) WITH PROPOFOL N/A 09/23/2017   Procedure:  ESOPHAGOGASTRODUODENOSCOPY (EGD) WITH PROPOFOL;  Surgeon: Graylin Shiver, MD;  Location: [facility];  Service: Endoscopy;  Laterality: N/A;   ESOPHAGOGASTRODUODENOSCOPY (EGD) WITH PROPOFOL N/A 05/19/2019   Procedure: ESOPHAGOGASTRODUODENOSCOPY (EGD) WITH PROPOFOL;  Surgeon: Kathi Der, MD;  Location: [facility];  Service: Gastroenterology;  Laterality: N/A;   ESOPHAGOGASTRODUODENOSCOPY (EGD) WITH PROPOFOL N/A 10/02/2020   Procedure: ESOPHAGOGASTRODUODENOSCOPY (EGD) WITH PROPOFOL;  Surgeon: Willis Modena, MD;  Location: [facility] ENDOSCOPY;  Service: Endoscopy;  Laterality: N/A;   GIVENS CAPSULE STUDY N/A 05/19/2019   Procedure: GIVENS CAPSULE STUDY;   Surgeon: Kathi Der, MD;  Location: [facility] ENDOS[facility];  Service: Gastroenterology;  Laterality: N/A;   GIVENS CAPSULE STUDY N/A 07/19/2021   Procedure: GIVENS CAPSULE STUDY;  Surgeon: [name], MD;  Location: [facility] ENDOSCOPY;  Service: Gastroenterology;  Laterality: N/A;   HEMOSTASIS CLIP PLACEMENT  09/22/2020   Procedure: HEMOSTASIS CLIP PLACEMENT;  Surgeon: Willis Modena, MD;  Location: [facility] ENDOS[facility];  Service: Endoscopy;;   HEMOSTASIS CONTROL  09/22/2020   Procedure: HEMOSTASIS CONTROL;  Surgeon: Willis Modena, MD;  Location[facility] ENDOSCOPY;  Service: Endoscopy;;   HOT HEMOSTASIS N/A 05/19/2019   Procedure: HOT HEMOSTASIS (ARGON PLASMA COAGULATION/BICAP);  Surgeon: Kathi Der, MD;  Location: [facility] ENDOSCOPY;  Service: Gastroenterology;  Laterality: N/A;   LEFT HEART CATH AND CORONARY ANGIOGRAPHY N/A 09/10/2020   Procedure: LEFT HEART CATH AND CORONARY ANGIOGRAPHY;  Surgeon: Yvonne Kendall, MD;  [facility]: [facility] INVASIVE CV LAB;  Service: Cardiovascular;  Laterality: N/A;   PARTIAL KNEE ARTHROPLASTY Left 11/12/2017   Procedure: left unicompartmental arthroplasty-medial;  Surgeon: Durene Romans, MD;  Location: [facility] ORS;  Service: Orthopedics;  Laterality: Left;    RIGHT HEART CATH AND CORONARY/GRAFT ANGIOGRAPHY N/A 06/26/2022   Procedure: RIGHT HEART CATH AND CORONARY/GRAFT ANGIOGRAPHY;  Surgeon: [name], MD;  [facility] INVASIVE CV LAB;  Service: Cardiovascular;  Laterality: N/A;   SUBMUCOSAL INJECTION  09/23/2017   Procedure: SUBMUCOSAL INJECTION;  Surgeon: Graylin Shiver, MD;  Location: [facility] ENDOSCOPY;  Service: Endoscopy;;  in colon   TEE WITHOUT CARDIOVERSION N/A 09/13/2020   Procedure: TRANSESOPHAGEAL ECHOCARDIOGRAM (TEE);  Surgeon: Alleen Borne, MD;  Location: [facility] OR;  Service: Open Heart Surgery;  Laterality: N/A;   TUBAL LIGATION  1990   Family History  Family History  Problem Relation Age of Onset   Lung cancer Mother    Diabetes Sister    Heart disease Sister    Asthma  Daughter    Arthritis Daughter    Arthritis Daughter    Thyroid cancer Other        1/2 sister   Heart disease Other        1/2 sister   Social History  reports that she quit smoking about 2 years ago. Her smoking use included cigarettes. She started smoking about 42 years ago. She has a 20 pack-year smoking history. She has never used smokeless tobacco. She reports current alcohol use. She reports that she does not use drugs. Allergies  Allergies  Allergen Reactions   3-Methyl-2-Benzothiazolinone Hydrazone Other (See Comments)    Muscle weakness muscle cramping in legs Other reaction(s): Myalgias (Muscle Pain)   Banana Anaphylaxis, Swelling and Other (See Comments)    TONGUE AND MOUTH SWELLING   Black Walnut Flavor Anaphylaxis and Itching    Walnuts   Hazelnut (Filbert) Anaphylaxis    Hazelnuts   Leflunomide And Related Other (See Comments)    Severe Headache   Lisinopril Swelling    Angioedema  Swelling of the face   No  [employer] Food Allergies Anaphylaxis    Spicy Mustard 4.7.2021 Pt reports that she eats Regular Yellow Mustard Swelling and itching of tongue   Other Anaphylaxis, Swelling and Other (See Comments)    Cosentyx= angioedema Mouth itches and tongue swelling Hazel nuts and pecans also Walnuts Renal failure Serotonin syndrome  Tremors Muscle weakness muscle cramping in legs Other reaction(s): Myalgias (Muscle Pain)   Pecan Nut (Diagnostic) Anaphylaxis and Itching    Pecans   Trazodone And Nefazodone Other (See Comments)    Serotonin Syndrome  Tremors   Adalimumab Other (See Comments)    Blisters on abdomen =humira   Escitalopram Oxalate Other (See Comments)    Hand problems - Serotonin Syndrome     Ezetimibe Other (See Comments)    myalgias cramps    Secukinumab Swelling    Cosentyx = angiodema    Statins Other (See Comments)    Leg pains and weakness   Ferrlecit [Na Ferric Gluc Cplx In Sucrose]     Not reaction given from HD   Gabapentin  Other (See Comments)    tremors   Nsaids     Avoid due to renal problems    Home medications Prior to Admission medications   Medication Sig Start Date End Date Taking? Authorizing Provider  allopurinol (ZYLOPRIM) 100 MG tablet Take 100 mg by mouth every evening.   Yes [provider]  cloNIDine (CATAPRES - DOSED IN MG/24 HR) 0.3 mg/24hr patch Place 0.3 mg onto the skin every Sunday.   Yes [provider]  cloNIDine (CATAPRES) 0.3 MG tablet  07/20/22  Yes [provider]  acetaminophen (TYLENOL) 500 MG tablet Take 1,000 mg by mouth daily as needed (pain).    [provider]  albuterol (PROVENTIL) (2.5 MG/3ML) 0.083% nebulizer solution Take 3 mLs (2.5 mg total) by nebulization every 6 (six) hours as needed for wheezing or shortness of breath. 07/10/21   Hunsucker, Lesia Sago, MD  aspirin EC 81 MG tablet Take 81 mg by mouth daily as needed (CAd). Swallow whole.    [provider]  B Complex-C (SUPER B COMPLEX PO) Take 1 tablet by mouth 4 (four) times a week. Take on Sun, Tues, Thurs, and Sat    [provider]  Camphor-Eucalyptus-Menthol (VICKS VAPORUB EX) Apply 1 Application topically at bedtime as needed (congestion). Patient not taking: Reported on 08/21/2022    [provider]  carboxymethylcellulose 1 % ophthalmic solution Place 1 drop into both eyes 3 (three) times daily.    [provider]  carvedilol (COREG) 25 MG tablet Take 25 mg by mouth 2 (two) times daily with a meal.    [provider]  cetirizine (ZYRTEC) 10 MG tablet Take 10 mg by mouth daily as needed for allergies.    [provider]  Cholecalciferol (VITAMIN D) 50 MCG (2000 UT) tablet Take 2,000 Units by mouth daily.    [provider]  cinacalcet (SENSIPAR) 30 MG tablet Take 60 mg by mouth every Monday, Wednesday, and Friday with hemodialysis.    [provider]  cyclobenzaprine (FLEXERIL) 10 MG tablet Take 10 mg by mouth 3  (three) times daily as needed for muscle spasms.    [provider]  Darbepoetin Alfa (ARANESP) 200 MCG/0.4ML SOSY injection Inject 0.4 mLs (200 mcg total) into the vein every Wednesday with hemodialysis. 10/10/20   Gershon Crane E, PA-C  diclofenac Sodium (VOLTAREN) 1 % GEL Apply 2 g topically daily as needed (pain).    [provider]  docusate sodium (COLACE) 100 MG capsule Take 100 mg by mouth daily.    [provider]  EPINEPHrine 0.3 mg/0.3 mL IJ SOAJ injection Inject into the muscle as needed for anaphylaxis. 07/12/21   [provider]  ferrous sulfate 325 (65 FE) MG tablet Take 325 mg by mouth 2 (two) times a week. Tues and Thurs    [provider]  fluticasone (FLONASE) 50 MCG/ACT nasal spray Place 1 spray into both nostrils daily as needed for allergies or rhinitis.    [provider]  heparin 1000 unit/mL SOLN injection 1 mL (1,000 Units total) by Dialysis route as needed (in dialysis). Patient not taking: Reported on 08/21/2022 10/04/20   Gershon Crane E, PA-C  hydrALAZINE (APRESOLINE) 100 MG tablet Take 1 tablet (100 mg total) by mouth every 8 (eight) hours. 07/06/20   Cresenzo, Cyndi Lennert, MD  hydrocortisone (ANUSOL-HC) 2.5 % rectal cream Place rectally 2 (two) times daily. Patient taking differently: Place 1 Application rectally 2 (two) times daily as needed for anal itching or hemorrhoids. 07/23/21   Park Pope, MD  isosorbide mononitrate (IMDUR) 60 MG 24 hr tablet Take 1 tablet (60 mg total) by mouth daily. 08/14/21   Bhagat, Bhavinkumar, PA  LORazepam (ATIVAN) 1 MG tablet Take 1 mg by mouth every 8 (eight) hours as needed for anxiety. 06/25/20   [provider]  montelukast (SINGULAIR) 10 MG tablet Take 10 mg by mouth daily with supper.     [provider]  multivitamin (RENA-VIT) TABS tablet Take 1 tablet by mouth every Monday, Wednesday, and Friday.    [provider]  NIFEdipine (PROCARDIA XL/NIFEDICAL XL) 60 MG 24 hr  tablet Take 60 mg by mouth 2 (two) times daily. 10/01/21   [provider]  olmesartan (BENICAR) 40 MG tablet Take 40 mg by mouth every evening. 05/06/19   [provider]  ondansetron (ZOFRAN) 4 MG tablet Take 4 mg by mouth 2 (two) times daily as needed for nausea or vomiting.    [provider]  Oxycodone HCl 10 MG TABS Take 10 mg by mouth 2 (two) times daily as needed for pain. 01/11/21   [provider]  OXYGEN Inhale 2 L into the lungs as directed.    [provider]  pantoprazole (PROTONIX) 40 MG tablet Take 40 mg by mouth 2 (two) times daily. 06/09/20   [provider]  predniSONE (DELTASONE) 5 MG tablet Take 1 tablet (5 mg total) by mouth daily with breakfast. 02/27/22   Marrianne Mood, MD  Probiotic Product (PROBIOTIC PO) Take 1 capsule by mouth daily.    [provider]  silver sulfADIAZINE (SILVADENE) 1 % cream Apply 1 Application topically daily as needed (wound care).    [provider]  sorbitol 70 % SOLN Take 30 ml 1-2 times daily as needed for constipation 07/23/21   Park Pope, MD  Tiotropium Bromide-Olodaterol (STIOLTO RESPIMAT) 2.5-2.5 MCG/ACT AERS Inhale 2 puffs once daily into the lungs. 11/28/21   Hunsucker, Lesia Sago, MD  VENTOLIN HFA 108 (90 Base) MCG/ACT inhaler Inhale 2 puffs into the lungs See admin instructions. Take 2 puffs every 4-6 hours as needed for wheezing and shortness of breath 04/08/22   Hunsucker, Lesia Sago, MD  White Petrolatum-Mineral Oil (SOOTHE NIGHTTIME) OINT Place 1 Application into both eyes at bedtime.    [provider]  zolpidem (AMBIEN) 5 MG tablet Take 5 mg by mouth at bedtime as needed for sleep. 02/04/21   [provider]  Vitals:   09/20/22 0539 09/20/22 0603 09/20/22 0734 09/20/22 0940  BP:  (!) 143/67  (!) 147/71  Pulse:  80 80 79  Resp:  18 16   Temp: 98.3 F (36.8 C) 98.3 F (36.8 C)  98 F (36.7 C)  TempSrc: Oral Oral  Oral  SpO2:  100%  99%   Weight:      Height:       Exam Gen alert, no distress, 2L [place] No rash, cyanosis or gangrene Sclera anicteric, throat clear  No jvd or bruits Chest bilat basilar rales RRR no MRG Abd soft ntnd no mass or ascites +bs GU defer MS no joint effusions or deformity Ext no LE or UE edema, no wounds or ulcers Neuro is alert, Ox 3 , nf    LUA AVF+bruit      Home meds include - clonidine patch 0.3, aspirin, colace, epi pen, hydralazine 100 tid, ativan prn, singulair, renavite, procardia xl 60 bid, olmesartan 40 hs, oxy IR prn, home O2 2L, protonix, prednisone 5 qam, allopurinol, coreg 25 bid, sensipar 60 mwf, imdur 60, stiolto respimat     OP HD: [facility] MWF [address]  3.5h   60kg  2K/3Ca bath  Hep none  L AVF - zemplar 8.5 mcg IV - sensipar 60mg  tiw   Assessment/ Plan: AMS - low BS +/- others, also is on pain meds and ativan at home.  Pulm edema - per CXR yesterdaty, +rales on exam. Will need extra HD today ESRD - on HD MWF. Extra HD today/ tonight.  Volume - as above, close to dry wt , max UF 2.5 L w/ hd today HTN - cont home meds PAF Chronic resp failure- on home O2      Rob Arlean Hopping  MD [facility] 09/20/2022, 10:05 AM  Recent Labs  Lab 09/19/22 0916 09/19/22 2116 09/20/22 0749 09/20/22 0906  HGB 12.8 11.9*  --  11.9*  ALBUMIN 4.0  --  3.2*  --   CALCIUM 9.5 8.7* 8.3*  --   CREATININE 6.53* 5.15* 5.94*  --   K 4.2 4.0 4.0  --    Inpatient medications:  allopurinol  100 mg Oral QPM   arformoterol  15 mcg Nebulization BID   And   umeclidinium bromide  1 puff Inhalation Daily   carvedilol  25 mg Oral BID WC   [START ON 09/22/2022] cinacalcet  60 mg Oral Q M,W,F-HD   heparin  5,000 Units Subcutaneous Q8H   insulin aspart  0-15 Units Subcutaneous Q2H   isosorbide mononitrate  60 mg Oral Daily   methylPREDNISolone (SOLU-MEDROL) injection  40 mg Intravenous Daily    dextrose 50 mL/hr at 09/20/22 0939   acetaminophen **OR** acetaminophen, albuterol, mouth  rinse, senna-docusate

## 2022-09-20 NOTE — Progress Notes (Signed)
 Off the unit/MRI

## 2022-09-20 NOTE — ED Notes (Signed)
Dr. Joneen Roach messaged regarding blood glucose (65)/ pt is lethargic and unable to tolerate drinking fluids at this time

## 2022-09-20 NOTE — Progress Notes (Signed)
RN picked pt. Up from MRI. Back on unit

## 2022-09-20 NOTE — ED Notes (Signed)
Tried x2 times to get a in and out but was unsuccessful. The patient got very agitated

## 2022-09-20 NOTE — Hospital Course (Addendum)
62yof w/ ESRD, CAD, HTN, anemia, chronic back pain, GERD, COPD, chronic respiratory failure, OSA, PAF, chronic diastolic heart failure presented to the ED for evaluation of confusion, abdominal pain. 2 days PTA patient took her oxycodone, and 1 day PTA took her Ativan. Daughter reports patient had similar confusion in the past in May at [place] hide UTI encephalopathy and severe symptomatic anemia needing transfusion.  Per daughter patient may be a little urine, has eaten very little in past 24 hours PTA had headache too. CT abdomen pelvis with contrast at noon> developing wall thickening along the transverse colon possible colitis increasing stranding ascites and anasarca extensive colonic diverticuli, distended gallbladder with some wall thickening-advised to correlate for any clinical symptoms of acute gallbladder pathology. Patient was treated with dextrose pain medication nausea medication. patient was discharged.  Patient went to her scheduled hemodialysis.  She had only 2 hours of hemodialysis today as patient's behavior remained altered.  When her son picked her up patient was listless, confused and fatigued.  She continued to complain of abdominal pain. She was arousable and had persistent hypoglycemia ,Hemoglobin A1c 4.5.Chest x-ray at 23:33> with cardiomegaly with interstitial edema. MRI limited as patient was noncompliant however no acute finding noticed. Her mentation was normalized as of blood sugar improved-likely multifactorial in the setting of hypoglycemia and also psychiatric medication advised not to take Neurontin at home. Underwent extra HD 8/10-8/11 am per nephrology Weaned off IV dextrose and blood sugar has stabilized, tolerating diet. At this time she is alert awake oriented, back to baseline. Blood pressure started to trend up Home antihypertensives were resumed. Continue with PT OT, see by PTOT [phone] again and planning for home w/ [facility]

## 2022-09-21 DIAGNOSIS — G9341 Metabolic encephalopathy: Secondary | ICD-10-CM | POA: Diagnosis not present

## 2022-09-21 DIAGNOSIS — E162 Hypoglycemia, unspecified: Secondary | ICD-10-CM | POA: Diagnosis not present

## 2022-09-21 LAB — BASIC METABOLIC PANEL WITH GFR
Anion gap: 11 (ref 5–15)
BUN: 12 mg/dL (ref 8–23)
CO2: 28 mmol/L (ref 22–32)
Calcium: 8.4 mg/dL — ABNORMAL LOW (ref 8.9–10.3)
Chloride: 94 mmol/L — ABNORMAL LOW (ref 98–111)
Creatinine, Ser: 3.94 mg/dL — ABNORMAL HIGH (ref 0.44–1.00)
GFR, Estimated: 12 mL/min — ABNORMAL LOW (ref >60)
Glucose, Bld: 96 mg/dL (ref 70–99)
Potassium: 3.8 mmol/L (ref 3.5–5.1)
Sodium: 133 mmol/L — ABNORMAL LOW (ref 135–145)

## 2022-09-21 LAB — CBC
HCT: 35 % — ABNORMAL LOW (ref 36.0–46.0)
Hemoglobin: 10.4 g/dL — ABNORMAL LOW (ref 12.0–15.0)
MCH: 31.6 pg (ref 26.0–34.0)
MCHC: 29.7 g/dL — ABNORMAL LOW (ref 30.0–36.0)
MCV: 106.4 fL — ABNORMAL HIGH (ref 80.0–100.0)
Platelets: 108 10*3/uL — ABNORMAL LOW (ref 150–400)
RBC: 3.29 MIL/uL — ABNORMAL LOW (ref 3.87–5.11)
RDW: 14.6 % (ref 11.5–15.5)
WBC: 2 10*3/uL — ABNORMAL LOW (ref 4.0–10.5)
nRBC: 0 % (ref 0.0–0.2)

## 2022-09-21 LAB — RPR: RPR Ser Ql: NONREACTIVE

## 2022-09-21 LAB — GLUCOSE, CAPILLARY
Glucose-Capillary: 110 mg/dL — ABNORMAL HIGH (ref 70–99)
Glucose-Capillary: 144 mg/dL — ABNORMAL HIGH (ref 70–99)
Glucose-Capillary: 145 mg/dL — ABNORMAL HIGH (ref 70–99)
Glucose-Capillary: 265 mg/dL — ABNORMAL HIGH (ref 70–99)
Glucose-Capillary: 80 mg/dL (ref 70–99)
Glucose-Capillary: 93 mg/dL (ref 70–99)

## 2022-09-21 MED ORDER — CLONIDINE HCL 0.3 MG/24HR TD PTWK
0.3000 mg | MEDICATED_PATCH | TRANSDERMAL | Status: DC
  Administered 2022-09-21: 0.3 mg via TRANSDERMAL
  Filled 2022-09-21: qty 1

## 2022-09-21 MED ORDER — ALUM & MAG HYDROXIDE-SIMETH 200-200-20 MG/5ML PO SUSP
30.0000 mL | Freq: Four times a day (QID) | ORAL | Status: DC | PRN
  Administered 2022-09-21: 30 mL via ORAL
  Filled 2022-09-21: qty 30

## 2022-09-21 MED ORDER — PREDNISONE 5 MG PO TABS
30.0000 mg | ORAL_TABLET | Freq: Every day | ORAL | Status: DC
  Administered 2022-09-21 – 2022-09-22 (×2): 30 mg via ORAL
  Filled 2022-09-21 (×2): qty 2

## 2022-09-21 MED ORDER — ALBUTEROL SULFATE (2.5 MG/3ML) 0.083% IN NEBU: 2.5000 mg | INHALATION_SOLUTION | RESPIRATORY_TRACT | Status: DC | PRN

## 2022-09-21 MED ORDER — MONTELUKAST SODIUM 10 MG PO TABS
10.0000 mg | ORAL_TABLET | Freq: Every day | ORAL | Status: DC
  Administered 2022-09-21 – 2022-09-22 (×2): 10 mg via ORAL
  Filled 2022-09-21 (×2): qty 1

## 2022-09-21 MED ORDER — CHLORHEXIDINE GLUCONATE CLOTH 2 % EX PADS
6.0000 | MEDICATED_PAD | Freq: Every day | CUTANEOUS | Status: DC
  Administered 2022-09-22 – 2022-09-23 (×2): 6 via TOPICAL

## 2022-09-21 MED ORDER — PANTOPRAZOLE SODIUM 40 MG PO TBEC
40.0000 mg | DELAYED_RELEASE_TABLET | Freq: Two times a day (BID) | ORAL | Status: DC
  Administered 2022-09-21 – 2022-09-23 (×5): 40 mg via ORAL
  Filled 2022-09-21 (×5): qty 1

## 2022-09-21 MED ORDER — HYDRALAZINE HCL 50 MG PO TABS
100.0000 mg | ORAL_TABLET | Freq: Three times a day (TID) | ORAL | Status: DC
  Administered 2022-09-21 – 2022-09-23 (×6): 100 mg via ORAL
  Filled 2022-09-21 (×6): qty 2

## 2022-09-21 MED ORDER — RENA-VITE PO TABS
1.0000 | ORAL_TABLET | ORAL | Status: DC
  Administered 2022-09-22: 1 via ORAL
  Filled 2022-09-21: qty 1

## 2022-09-21 MED ORDER — CALCIUM CARBONATE ANTACID 500 MG PO CHEW
1.0000 | CHEWABLE_TABLET | Freq: Once | ORAL | Status: AC
  Administered 2022-09-21: 200 mg via ORAL
  Filled 2022-09-21: qty 1

## 2022-09-21 MED ORDER — ALBUTEROL SULFATE HFA 108 (90 BASE) MCG/ACT IN AERS: 2.0000 | INHALATION_SPRAY | RESPIRATORY_TRACT | Status: DC

## 2022-09-21 NOTE — Progress Notes (Signed)
Received patient in bed to unit.  Alert and oriented.  Informed consent signed and in chart.   TX duration: 3hours  Patient tolerated well.  Transported back to the room  Alert, without acute distress.  Hand-off given to patient's nurse.   Access used: fistula Access issues: none  Total UF removed: 2500 ml Medication(s) given: ativan 1 mg Post HD VS: 171/62 Post HD weight: 57.2 kg     09/20/22 2353  Vitals  Temp 97.7 F (36.5 C)  Temp Source Oral  BP (!) 180/70  MAP (mmHg) 97  BP Location Right Arm  BP Method Automatic  Patient Position (if appropriate) Lying  Pulse Rate 78  Pulse Rate Source Monitor  ECG Heart Rate 79  Resp (!) 28  Oxygen Therapy  SpO2 93 %  O2 Device Nasal Cannula  Patient Activity (if Appropriate) In bed  Pulse Oximetry Type Continuous  During Treatment Monitoring  Blood Flow Rate (mL/min) 0 mL/min  Arterial Pressure (mmHg) -22.63 mmHg  Venous Pressure (mmHg) 86.46 mmHg  TMP (mmHg) -8.08 mmHg  Ultrafiltration Rate (mL/min) 980 mL/min  Dialysate Flow Rate (mL/min) 300 ml/min  Dialysate Potassium Concentration 3  Dialysate Calcium Concentration 2.5  Post Treatment  Dialyzer Clearance Lightly streaked  Duration of HD Treatment -hour(s) 3 hour(s)  Hemodialysis Intake (mL) 0 mL  Liters Processed 72  Fluid Removed (mL) 2500.16 mL  Tolerated HD Treatment Yes  Post-Hemodialysis Comments HD tx complete.  tolerated well.  pt is stable.  AVG/AVF Arterial Site Held (minutes) 10 minutes  AVG/AVF Venous Site Held (minutes) 10 minutes  Note  Patient Observations pt is 5in bed resting.  Fistula / Graft Left Upper arm  Placement Date/Time: 05/17/18 0800   Orientation: Left  Access Location: Upper arm  Site Condition No complications  Fistula / Graft Assessment Present;Thrill;Bruit;Aneurysm present  Status Deaccessed  Drainage Description None

## 2022-09-21 NOTE — Evaluation (Signed)
Occupational Therapy Evaluation Patient Details Name: Jodi Bell MRN: [id number] DOB: 07-10-1960 Today's Date: 09/21/2022   History of Present Illness Pt is a 62 y.o. female admitted on 09/19/22 for Confusion and abdominal pain.  Pt dx with acute metabolic encephalopathy likely due to hypoglycemia.  Pt with significant PMH of anemia of chronic disease, chronic back pain s/p lumbar fusion, COPD, DM, ESRD on HD, essential HTN, gout, PAD, aortic stenosis, mitral regurgitation, and L partial knee replacement.   Clinical Impression   Pt admitted with the above diagnoses and presents with below problem list. Pt will benefit from continued acute OT to address the below listed deficits and maximize independence with basic ADLs prior to d/c home. At baseline, pt is mod I with ADLs (cane vs walker). Pt currently needs up to contact guard assist with LB ADLs and functional transfers/mobility. Tolerated session well mobilized in room to sit up in recliner.         If plan is discharge home, recommend the following:      Functional Status Assessment  Patient has had a recent decline in their functional status and demonstrates the ability to make significant improvements in function in a reasonable and predictable amount of time.  Equipment Recommendations  None recommended by OT (discussed geting grab bars for shower)    Recommendations for Other Services       Precautions / Restrictions Precautions Precautions: Fall;Other (comment) Precaution Comments: monitor O2 sats with activity on 2-3 L O2 continous at baseline      Mobility Bed Mobility Overal bed mobility: Needs Assistance Bed Mobility: Supine to Sit       Sit to supine: Contact guard assist        Transfers Overall transfer level: Needs assistance Equipment used: Rolling walker (2 wheels) Transfers: Sit to/from Stand Sit to Stand: Contact guard assist           General transfer comment: to/from EOB and recliner       Balance Overall balance assessment: Mild deficits observed, not formally tested                                         ADL either performed or assessed with clinical judgement   ADL Overall ADL's : Needs assistance/impaired Eating/Feeding: Set up;Sitting   Grooming: Set up;Sitting   Upper Body Bathing: Set up;Sitting   Lower Body Bathing: Contact guard assist;Sit to/from stand   Upper Body Dressing : Set up;Sitting   Lower Body Dressing: Contact guard assist;Sit to/from stand   Toilet Transfer: Contact guard assist;Ambulation   Toileting- Clothing Manipulation and Hygiene: Contact guard assist;Sit to/from stand   Tub/ Engineer, structural: Contact guard assist   Functional mobility during ADLs: Contact guard assist;Rolling walker (2 wheels)       Vision         Perception         Praxis         Pertinent Vitals/Pain Pain Assessment Pain Assessment: Faces Faces Pain Scale: Hurts even more Pain Location: chronic pain R knee Pain Descriptors / Indicators: Grimacing, Guarding Pain Intervention(s): Monitored during session, Limited activity within patient's tolerance, Repositioned     Extremity/Trunk Assessment Upper Extremity Assessment Upper Extremity Assessment: Generalized weakness   Lower Extremity Assessment Lower Extremity Assessment: Defer to PT evaluation RLE Deficits / Details: right knee with significant arthritis per pt, she avoids going up the  stairs leading with this side. LLE Deficits / Details: generally weak, at least 3/5 per gross functional assessment.   Cervical / Trunk Assessment Cervical / Trunk Assessment: Back Surgery (h/o lumbar fusion)   Communication Communication Communication: No apparent difficulties   Cognition Arousal: Alert Behavior During Therapy: WFL for tasks assessed/performed Overall Cognitive Status: Within Functional Limits for tasks assessed                                        General Comments  Received on 3L with SpO2 96-97, decrease O2 to 2L at end of session with SpO2 97. Pt resting comfortably    Exercises     Shoulder Instructions      Home Living Family/patient expects to be discharged to:: Private residence Living Arrangements: Children Available Help at Discharge: Family;Available PRN/intermittently Type of Home: House Home Access: Level entry     Home Layout: Multi-level;Able to live on main level with bedroom/bathroom Alternate Level Stairs-Number of Steps: 13 Alternate Level Stairs-Rails: Left Bathroom Shower/Tub: Tub/shower unit   Bathroom Toilet: Standard Bathroom Accessibility: Yes   Home Equipment: Cane - single point;Rolling Walker (2 wheels);Rollator (4 wheels);Shower seat;Toilet riser;Grab bars - tub/shower;Hand held shower head   Additional Comments: pt resides with her daughter, son in law, and grandchildren, left alone for only a few hours/day and family able to provide assistance if needed when around. Pt able to stay on main level but occasionally goes upstairs which is where her kitchen is as she enjoys cooking, but has a mini fridge and microwave downstairs when she doesn't feel up to doing the steps      Prior Functioning/Environment Prior Level of Function : Independent/Modified Independent;Driving             Mobility Comments: pt drives ot HD (only 7 miles away per pt) and is able to walk in from her parked car outside. ADLs Comments: pt sits to shower, mod I for self care, assisted for IADLs, pt drives to HD        OT Problem List: Decreased strength;Decreased activity tolerance;Impaired balance (sitting and/or standing);Decreased knowledge of use of DME or AE;Decreased knowledge of precautions;Pain      OT Treatment/Interventions: Self-care/ADL training;Therapeutic exercise;DME and/or AE instruction;Balance training;Patient/family education;Therapeutic activities    OT Goals(Current goals can be found in the  care plan section) Acute Rehab OT Goals Patient Stated Goal: home, be able to walk up and down stairs to get to kitchen OT Goal Formulation: With patient Time For Goal Achievement: 10/05/22 Potential to Achieve Goals: Fair ADL Goals Pt Will Perform Grooming: with modified independence;standing Pt Will Transfer to Toilet: with modified independence;ambulating Pt Will Perform Toileting - Clothing Manipulation and hygiene: with modified independence;sit to/from stand  OT Frequency: Min 2X/week    Co-evaluation              AM-PAC OT "6 Clicks" Daily Activity     Outcome Measure Help from another person eating meals?: None Help from another person taking care of personal grooming?: None Help from another person toileting, which includes using toliet, bedpan, or urinal?: A Little Help from another person bathing (including washing, rinsing, drying)?: A Little Help from another person to put on and taking off regular upper body clothing?: None Help from another person to put on and taking off regular lower body clothing?: A Little 6 Click Score: 21   End of Session  Equipment Utilized During Treatment: Rolling walker (2 wheels);Oxygen  Activity Tolerance: Patient limited by fatigue;Patient tolerated treatment well Patient left: in chair;with call bell/phone within reach;with family/visitor present  OT Visit Diagnosis: Unsteadiness on feet (R26.81);Pain                Time: 1346-1416 OT Time Calculation (min): 30 min Charges:  OT General Charges $OT Visit: 1 Visit OT Evaluation $OT Eval Moderate Complexity: 1 Mod OT Treatments $Self Care/Home Management : 8-22 mins  Raynald Kemp, OT Acute Rehabilitation Services Office: [phone]   Pilar Grammes 09/21/2022, 4:09 PM

## 2022-09-21 NOTE — Progress Notes (Signed)
PROGRESS NOTE Jodi Bell  BJY:[id number] DOB: [birth year] DOA: 09/19/2022 PCP: Malka So., MD  Brief Narrative/Hospital Course: 77yof w/ ESRD, CAD, HTN, anemia, chronic back pain, GERD, COPD, chronic respiratory failure, OSA, PAF, chronic diastolic heart failure presented to the ED for evaluation of confusion, abdominal pain. 2 days PTA patient took her oxycodone, and 1 day PTA took her Ativan. Daughter reports patient had similar confusion in the past in May at [facility] UTI encephalopathy and severe symptomatic anemia needing transfusion.  Per daughter patient may be a little urine, has eaten very little in past 24 hours PTA had headache too. CT abdomen pelvis with contrast at noon> developing wall thickening along the transverse colon possible colitis increasing stranding ascites and anasarca extensive colonic diverticuli, distended gallbladder with some wall thickening-advised to correlate for any clinical symptoms of acute gallbladder pathology. Patient was treated with dextrose pain medication nausea medication. patient was discharged.  Patient went to her scheduled hemodialysis.  She had only 2 hours of hemodialysis today as patient's behavior remained altered.  When her son picked her up patient was listless, confused and fatigued.  She continued to complain of abdominal pain. She was arousable and had persistent hypoglycemia ,Hemoglobin A1c 4.5.Chest x-ray at 23:33> with cardiomegaly with interstitial edema. MRI limited as patient was noncompliant however no acute finding noticed. Her mentation was normalized as of blood sugar improved-likely multifactorial in the setting of hypoglycemia and also psychiatric medication advised not to take Neurontin at home. Underwent extra HD 8/10-8/11 am per nephrology Weaned off IV dextrose and blood sugar has stabilized, tolerating diet. At this time she is alert awake oriented, back to baseline.  Subjective: Patient seen examined this  morning Daughter at the bedside she feels patient is back to baseline alert awake moving all extremities no focal weakness  Tolerating diet and blood sugar has improved.   But remains weak and deconditioned denies dysurea. Some abd fullness Reports taking ativan, flexeril in am but no pain meds  Assessment and Plan: Principal Problem:   Acute metabolic encephalopathy due to hypoglycemia Active Problems:   Essential hypertension   Psoriasis arthropathica (HCC)   COPD (chronic obstructive pulmonary disease) (HCC)   Tobacco use   ESRD (end stage renal disease) (HCC)   OSA (obstructive sleep apnea)   Chronic diastolic CHF (congestive heart failure) (HCC)   Paroxysmal atrial fibrillation (HCC)   Chronic respiratory failure with hypoxia (HCC)   Acute metabolic encephalopathy   Uncontrolled type 2 diabetes mellitus with hypoglycemia, with long-term current use of insulin (HCC)   Acute metabolic encephalopathy: Likely due to hypoglycemia-in polypharmacy, mentation improved back to baseline, MRI brain although limited no acute finding.  Continue precaution fall precaution avoid polypharmacy/sedatives.  No evidence of infection currently  Hypoglycemia: Likely from poor oral intake, random cortisol low suspect in the setting of chronic steroid use of IV fluids, tolerating diet.   Recent Labs  Lab 09/20/22 0017 09/20/22 0145 09/20/22 1607 09/21/22 0029 09/21/22 0402 09/21/22 0851 09/21/22 1156  GLUCAP  --    < > 138* 93 110* 80 144*  HGBA1C 4.5*  --   --   --   --   --   --    < > = values in this interval not displayed.    Chronic diastolic CHF ESRD on HD on MWF: Nephro input appreciated underwent HD, monitor fluid status potassium and bicarb level.   Chronic respiratory failure with hypoxia COPD: Patient on chronic steroid> received  stress dose  now on prednisone weaning down to home dose Continue Brovana, Incruse Ellipta and Pulmicort, as needed nebulizer  PAF: Continue  Coreg.  Not on anticoagulation  Psoriatic arthropathy On daily prednisone  Thrombocytopenia: On lower side, monitor Recent Labs  Lab 09/19/22 0916 09/19/22 2116 09/20/22 0906 09/21/22 0252  PLT 129* 121* 114* 108*    Deconditioning/debility: Request PT OT evaluation for disposition  DVT prophylaxis: heparin injection 5,000 Units Start: 09/20/22 0745 Code Status:   Code Status: Full Code Family Communication: plan of care discussed with patient/daughter at bedside. Patient status is: admitted as observation but remains hospitalized for ongoing  because of AMS Level of care: Telemetry Medical   Dispo: The patient is from: HOME WITH DAUGHTER            Anticipated disposition: Anticipating discharge tomorrow if does well with PT OT reevaluation tomorrow   Objective: Vitals last 24 hrs: Vitals:   09/21/22 0352 09/21/22 0757 09/21/22 0759 09/21/22 1153  BP: (!) 158/70  (!) 165/78 (!) 147/72  Pulse: 71  74 73  Resp: 20   18  Temp: 98.6 F (37 C)  99.5 F (37.5 C) 99.4 F (37.4 C)  TempSrc: Oral  Oral Oral  SpO2: 100% 98% 100% 100%  Weight:      Height:       Weight change: -3.8 kg  Physical Examination: General exam: alert awake, oriented  HEENT:Oral mucosa moist, Ear/Nose WNL grossly Respiratory system: Bilaterally clear BS,no use of accessory muscle Cardiovascular system: S1 & S2 +, No JVD. Gastrointestinal system: Abdomen soft,NT,ND, BS+ Nervous System: Alert, awake, moving all extremities,and following commands. Extremities: LE edema neg,distal peripheral pulses palpable and warm.  Skin: No rashes,no icterus. MSK: Normal muscle bulk,tone, power   Medications reviewed:  Scheduled Meds:  allopurinol  100 mg Oral QPM   arformoterol  15 mcg Nebulization BID   And   umeclidinium bromide  1 puff Inhalation Daily   budesonide (PULMICORT) nebulizer solution  0.25 mg Nebulization BID   carvedilol  25 mg Oral BID WC   Chlorhexidine Gluconate Cloth  6 each Topical  Q0600   [START ON 09/22/2022] Chlorhexidine Gluconate Cloth  6 each Topical Q0600   [START ON 09/22/2022] cinacalcet  60 mg Oral Q M,W,F-HD   heparin  5,000 Units Subcutaneous Q8H   insulin aspart  0-15 Units Subcutaneous Q4H   isosorbide mononitrate  60 mg Oral Daily   montelukast  10 mg Oral Q supper   [START ON 09/22/2022] multivitamin  1 tablet Oral Q M,W,F-2000   pantoprazole  40 mg Oral BID   predniSONE  30 mg Oral Q breakfast   Continuous Infusions:    Diet Order             Diet heart healthy/carb modified Room service appropriate? Yes; Fluid consistency: Thin  Diet effective now                    Intake/Output Summary (Last 24 hours) at 09/21/2022 1537 Last data filed at 09/21/2022 1000 Gross per 24 hour  Intake 783.96 ml  Output 19401.95 ml  Net -18617.99 ml   Net IO Since Admission: -18,262.38 mL [09/21/22 1537]  Wt Readings from Last 3 Encounters:  09/21/22 57.2 kg  09/19/22 61 kg  08/21/22 61 kg     Unresulted Labs (From admission, onward)     Start     Ordered   09/21/22 0500  Basic metabolic panel  Daily,   R  09/20/22 1457   09/21/22 0500  CBC  Daily,   R      09/20/22 1457   09/20/22 1206  Hepatitis B surface antibody,quantitative  (New Admission Hemo Labs (Hepatitis B))  Once,   R        09/20/22 1206   09/20/22 0658  CBC with Differential/Platelet  Once,   R        09/20/22 0657   09/19/22 2325  Urinalysis, w/ Reflex to Culture (Infection Suspected) -Urine, Catheterized  (Urine Labs)  Once,   URGENT       Question:  Specimen Source  Answer:  Urine, Catheterized   09/19/22 2324   Signed and Held  Renal function panel  Once,   R        Signed and Held   Signed and Held  CBC  Once,   R        Signed and Held          Data Reviewed: I have personally reviewed following labs and imaging studies CBC: Recent Labs  Lab 09/19/22 0916 09/19/22 2116 09/20/22 0906 09/21/22 0252  WBC 3.7* 4.4 3.7* 2.0*  NEUTROABS  --   --  1.8  --   HGB  12.8 11.9* 11.9* 10.4*  HCT 42.8 40.7 39.7 35.0*  MCV 105.7* 110.3* 108.8* 106.4*  PLT 129* 121* 114* 108*   Basic Metabolic Panel: Recent Labs  Lab 09/19/22 0916 09/19/22 2116 09/20/22 0749 09/21/22 0252  NA 137 134* 134* 133*  K 4.2 4.0 4.0 3.8  CL 99 100 95* 94*  CO2 25 24 26 28   GLUCOSE 41* 61* 87 96  BUN 33* 22 22 12   CREATININE 6.53* 5.15* 5.94* 3.94*  CALCIUM 9.5 8.7* 8.3* 8.4*  MG  --   --  2.0  --    GFR: Estimated Creatinine Clearance: 12.2 mL/min (A) (by C-G formula based on SCr of 3.94 mg/dL (H)). Liver Function Tests: Recent Labs  Lab 09/19/22 0916 09/20/22 0749  AST 16 16  ALT 5 7  ALKPHOS 130* 108  BILITOT 0.4 0.4  PROT 6.9 6.4*  ALBUMIN 4.0 3.2*   Recent Labs  Lab 09/19/22 0916  LIPASE 28   Recent Labs  Lab 09/19/22 2225  AMMONIA 32   Recent Labs    09/20/22 0017  HGBA1C 4.5*   Recent Labs    09/20/22 0749  TSH 3.761   Sepsis Labs: Recent Labs  Lab 09/19/22 2232 09/20/22 0029  LATICACIDVEN 0.4* 0.5   Recent Results (from the past 240 hour(s))  SARS Coronavirus 2 by RT PCR (hospital order, performed in [facility] hospital lab) *cepheid single result test* Anterior Nasal Swab     Status: None   Collection Time: 09/19/22 11:29 PM   Specimen: Anterior Nasal Swab  Result Value Ref Range Status   SARS Coronavirus 2 by RT PCR NEGATIVE NEGATIVE Final    Comment: Performed at [facility] Lab, [address], [place], [place] [address]    Antimicrobials: Anti-infectives (From admission, onward)    None      Culture/Microbiology    Component Value Date/Time   SDES BLOOD SITE NOT SPECIFIED 06/26/2021 0650   SPECREQUEST  06/26/2021 0650    BOTTLES DRAWN AEROBIC AND ANAEROBIC Blood Culture results may not be optimal due to an excessive volume of blood received in culture bottles   CULT  06/26/2021 0650    NO GROWTH 5 DAYS Performed at [facility] Lab, [address],  [place], [place] [address]    REPTSTATUS 07/01/2021  FINAL 06/26/2021 0650  Radiology Studies: MR BRAIN WO CONTRAST  Result Date: 09/20/2022 CLINICAL DATA:  Mental status change. EXAM: MRI HEAD WITHOUT CONTRAST TECHNIQUE: Multiplanar, multiecho pulse sequences of the brain and surrounding structures were obtained without intravenous contrast. The examination had to be discontinued prior to completion due to patient refusal for further imaging. Only diffusion-weighted imaging and a highly motion degraded axial FLAIR sequence were completed. COMPARISON:  MR head without contrast 05/02/2021 FINDINGS: Brain: The diffusion-weighted images demonstrate no acute or subacute infarction. Moderate periventricular subcortical white matter disease is again noted. The patient motion precludes other significant diagnostic interpretation of these images. IMPRESSION: 1. No acute or subacute infarction. 2. Moderate periventricular subcortical white matter disease is again noted. 3. The patient motion precludes other significant diagnostic interpretation of these images. 4. The study was terminated prematurely due to the patient's discontinuing the examination. Electronically Signed   By: Marin Roberts M.D.   On: 09/20/2022 17:34   DG CHEST PORT 1 VIEW  Result Date: 09/19/2022 CLINICAL DATA:  Confusion EXAM: PORTABLE CHEST 1 VIEW COMPARISON:  CT chest dated 07/24/2022 FINDINGS: Increased interstitial markings, favoring mild central interstitial edema. No definite pleural effusions. No pneumothorax. Cardiomegaly.  Postsurgical changes related to prior CABG. Median sternotomy. Cervical spine fixation hardware, incompletely visualized. IMPRESSION: Cardiomegaly with mild central interstitial edema. Electronically Signed   By: Charline Bills M.D.   On: 09/19/2022 23:33     LOS: 1 day   Lanae Boast, MD [facility]  09/21/2022, 3:37 PM

## 2022-09-21 NOTE — Progress Notes (Signed)
[facility] Progress Note   Subjective:   Had HD last night with net UF 2.5L. Reports she still feels "shaky." Denies abdominal pain, N/V/D, SOB but does tell me she took gabapentin, percocet, ativan and ambien at various points over the past several days- does not usually take gabapentin or percocet but was having a lot of abdominal pain, takes ativan rarely. Has had jerking movements with gabapentin in the past.   Objective Vitals:   09/21/22 0032 09/21/22 0352 09/21/22 0757 09/21/22 0759  BP: (!) 174/61 (!) 158/70  (!) 165/78  Pulse: 75 71  74  Resp: 18 20    Temp: 98.7 F (37.1 C) 98.6 F (37 C)  99.5 F (37.5 C)  TempSrc: Oral Oral  Oral  SpO2: 100% 100% 98% 100%  Weight:      Height:       Physical Exam General: Alert female in NAD Heart: RRR, no murmurs, rubs or gallops Lungs: CTA bilaterally Abdomen: Soft, +BS Extremities: No edema b/l lower extremities Dialysis Access: LUE AVF  Additional Objective Labs: Basic Metabolic Panel: Recent Labs  Lab 09/19/22 2116 09/20/22 0749 09/21/22 0252  NA 134* 134* 133*  K 4.0 4.0 3.8  CL 100 95* 94*  CO2 24 26 28   GLUCOSE 61* 87 96  BUN 22 22 12   CREATININE 5.15* 5.94* 3.94*  CALCIUM 8.7* 8.3* 8.4*   Liver Function Tests: Recent Labs  Lab 09/19/22 0916 09/20/22 0749  AST 16 16  ALT 5 7  ALKPHOS 130* 108  BILITOT 0.4 0.4  PROT 6.9 6.4*  ALBUMIN 4.0 3.2*   Recent Labs  Lab 09/19/22 0916  LIPASE 28   CBC: Recent Labs  Lab 09/19/22 0916 09/19/22 2116 09/20/22 0906 09/21/22 0252  WBC 3.7* 4.4 3.7* 2.0*  NEUTROABS  --   --  1.8  --   HGB 12.8 11.9* 11.9* 10.4*  HCT 42.8 40.7 39.7 35.0*  MCV 105.7* 110.3* 108.8* 106.4*  PLT 129* 121* 114* 108*   Blood Culture    Component Value Date/Time   SDES BLOOD SITE NOT SPECIFIED 06/26/2021 0650   SPECREQUEST  06/26/2021 0650    BOTTLES DRAWN AEROBIC AND ANAEROBIC Blood Culture results may not be optimal due to an excessive volume of blood  received in culture bottles   CULT  06/26/2021 0650    NO GROWTH 5 DAYS Performed at [facility], [address] [place], [place] [address]    REPTSTATUS 07/01/2021 FINAL 06/26/2021 0650    Cardiac Enzymes: No results for input(s): "CKTOTAL", "CKMB", "CKMBINDEX", "TROPONINI" in the last 168 hours. CBG: Recent Labs  Lab 09/20/22 1323 09/20/22 1607 09/21/22 0029 09/21/22 0402 09/21/22 0851  GLUCAP 113* 138* 93 110* 80   Iron Studies: No results for input(s): "IRON", "TIBC", "TRANSFERRIN", "FERRITIN" in the last 72 hours. @lablastinr3 @ Studies/Results: MR BRAIN WO CONTRAST  Result Date: 09/20/2022 CLINICAL DATA:  Mental status change. EXAM: MRI HEAD WITHOUT CONTRAST TECHNIQUE: Multiplanar, multiecho pulse sequences of the brain and surrounding structures were obtained without intravenous contrast. The examination had to be discontinued prior to completion due to patient refusal for further imaging. Only diffusion-weighted imaging and a highly motion degraded axial FLAIR sequence were completed. COMPARISON:  MR head without contrast 05/02/2021 FINDINGS: Brain: The diffusion-weighted images demonstrate no acute or subacute infarction. Moderate periventricular subcortical white matter disease is again noted. The patient motion precludes other significant diagnostic interpretation of these images. IMPRESSION: 1. No acute or subacute infarction. 2. Moderate periventricular subcortical  white matter disease is again noted. 3. The patient motion precludes other significant diagnostic interpretation of these images. 4. The study was terminated prematurely due to the patient's discontinuing the examination. Electronically Signed   By: Marin Roberts M.D.   On: 09/20/2022 17:34   DG CHEST PORT 1 VIEW  Result Date: 09/19/2022 CLINICAL DATA:  Confusion EXAM: PORTABLE CHEST 1 VIEW COMPARISON:  CT chest dated 07/24/2022 FINDINGS: Increased interstitial markings, favoring mild central  interstitial edema. No definite pleural effusions. No pneumothorax. Cardiomegaly.  Postsurgical changes related to prior CABG. Median sternotomy. Cervical spine fixation hardware, incompletely visualized. IMPRESSION: Cardiomegaly with mild central interstitial edema. Electronically Signed   By: Charline Bills M.D.   On: 09/19/2022 23:33   CT ABDOMEN PELVIS W CONTRAST  Result Date: 09/19/2022 CLINICAL DATA:  Dialysis patient with developing abdominal pain and nausea. EXAM: CT ABDOMEN AND PELVIS WITH CONTRAST TECHNIQUE: Multidetector CT imaging of the abdomen and pelvis was performed using the standard protocol following bolus administration of intravenous contrast. RADIATION DOSE REDUCTION: This exam was performed according to the departmental dose-optimization program which includes automated exposure control, adjustment of the mA and/or kV according to patient size and/or use of iterative reconstruction technique. CONTRAST:  75mL OMNIPAQUE IOHEXOL 300 MG/ML  SOLN COMPARISON:  CT angiogram 07/24/2022. Multiple older CT examinations. FINDINGS: Lower chest: Heart is diffusely enlarged. Coronary artery calcifications are seen. Small pericardial effusion. Trace pleural fluid. Enlargement of the pulmonary vessels. Hepatobiliary: Contrast bolus is more late arterial. Incomplete opacification of the hepatic veins. Fatty liver infiltration identified. Distended gallbladder with wall thickening. Patent portal vein. Pancreas: Unremarkable. No pancreatic ductal dilatation or surrounding inflammatory changes. Spleen: Spleen is nonenlarged. There is low-attenuation lesion along the inferior aspect of the spleen measuring 7 mm on series 2, image 30. Too small to completely characterize but unchanged going back to multiple prior examinations. Nonaggressive in appearance. No additional follow-up. Adrenals/Urinary Tract: The adrenal glands are preserved. There is moderate atrophy of the kidneys with the extensive vascular  calcifications extending into the hilum. 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 No specific imaging follow-up. No clearly enhancing mass at this time. The ureters have normal course and caliber extending down to the bladder. Bladder is contracted. Stomach/Bowel: No oral contrast. The stomach is mildly distended with fluid. The small bowel is nondilated diffusely. Normal appendix in the right lower quadrant. The large bowel demonstrates diffuse colonic diverticula. There is some wall thickening along the transverse colon. A colitis is possible. Colon is nondilated. Vascular/Lymphatic: Diffuse vascular calcifications identified. Normal caliber aorta and IVC. There are some enlarged retroperitoneal nodes identified. Example left para-on series 2, image 31 measures 19 by 16 mm. This was seen previously in retrospect and is unchanged. Further smaller nodes identified elsewhere along the retroperitoneum. Reproductive: Uterus and bilateral adnexa are unremarkable. Other: Increasing anasarca.  Mesenteric haziness and mild ascites. Musculoskeletal: Significant streak artifact related to the spinal fixation hardware along the lumbar spine with stable compression deformities stable endplate irregularity at the L2-3 level. Diffuse bony sclerosis identified related to the patient's known history of chronic renal failure. Multilevel stenosis along the spine. IMPRESSION: Developing wall thickening along the transverse colon. Possible colitis. Increasing stranding, ascites and anasarca. Extensive colonic diverticula. Tiny right pleural effusion. Distended gallbladder with some wall thickening. Please correlate for any clinical symptoms of acute gallbladder pathology and if needed follow up gallbladder ultrasound. Stable mildly enlarged retroperitoneal lymph node. Moderate renal atrophy. Electronically Signed   By:  Karen Kays M.D.   On: 09/19/2022 11:56   Medications:   allopurinol  100 mg Oral QPM    arformoterol  15 mcg Nebulization BID   And   umeclidinium bromide  1 puff Inhalation Daily   budesonide (PULMICORT) nebulizer solution  0.25 mg Nebulization BID   carvedilol  25 mg Oral BID WC   Chlorhexidine Gluconate Cloth  6 each Topical Q0600   [START ON 09/22/2022] cinacalcet  60 mg Oral Q M,W,F-HD   heparin  5,000 Units Subcutaneous Q8H   insulin aspart  0-15 Units Subcutaneous Q4H   isosorbide mononitrate  60 mg Oral Daily   predniSONE  30 mg Oral Q breakfast    OP Dialysis Orders: [facility] MWF [address]  3.5h   [facility]  2K/3Ca bath  Hep none  L AVF - zemplar 8.5 mcg IV - sensipar 60mg  tiw  Assessment/Plan: AMS - presented with hypoglycemia. Also took multiple sedating meds this week that she does not usually take. Advised she should not take any more gabapentin at home since she has had issues with it in the past. Further work up per primary team Pulm edema - seen on CXR and exam. Had extra HD Saturday. Further UF with HD tomorrow. Her max UF seems to be fairly low- 2.5L.  ESRD - on HD MWF. See above, next HD tomorrow. HTN - continue carvedilol and optimize volume status with HD PAF- on carvedilol Chronic resp failure- on home O2 at baseline Anemia- Hgb 10.4, no ESA indicated at this time Secondary hyperparathyroidism; Ca controlled, will check phos with next labs  Rogers Blocker, PA-C 09/21/2022, 9:10 AM  [facility] Pager: [phone]

## 2022-09-21 NOTE — Evaluation (Signed)
Physical Therapy Evaluation Patient Details Name: Jodi Bell MRN: [id number] DOB: Jul 01, 1960 Today's Date: 09/21/2022  History of Present Illness  Pt is a 62 y.o. female admitted on 09/19/22 for Confusion and abdominal pain.  Pt dx with acute metabolic encephalopathy likely due to hypoglycemia.  Pt with significant PMH of anemia of chronic disease, chronic back pain s/p lumbar fusion, COPD, DM, ESRD on HD, essential HTN, gout, PAD, aortic stenosis, mitral regurgitation, and L partial knee replacement.  Clinical Impression  Pt is generally weak and deconditioned from her acute illness.  She was able to do quite a bit with me today, but subjectively still feels shaky.  She would benefit from one more therapy session in the AM to ensure that she continues to feel stronger on a daily basis.  Please re-practice gait and stair training in the AM.   PT to follow acutely for deficits listed below.         If plan is discharge home, recommend the following: A little help with walking and/or transfers;A little help with bathing/dressing/bathroom;Assistance with cooking/housework;Assist for transportation;Help with stairs or ramp for entrance   Can travel by private vehicle        Equipment Recommendations None recommended by PT  Recommendations for Other Services       Functional Status Assessment Patient has had a recent decline in their functional status and demonstrates the ability to make significant improvements in function in a reasonable and predictable amount of time.     Precautions / Restrictions Precautions Precautions: Fall;Other (comment) Precaution Comments: monitor O2 sats with activity on 2-3 L O2 continous at baseline      Mobility  Bed Mobility Overal bed mobility: Needs Assistance Bed Mobility: Sit to Supine       Sit to supine: Contact guard assist        Transfers Overall transfer level: Needs assistance Equipment used: Rollator (4 wheels) Transfers: Sit  to/from Stand Sit to Stand: Contact guard assist           General transfer comment: CGA for safety, cues for rollator breaks    Ambulation/Gait Ambulation/Gait assistance: Contact guard assist Gait Distance (Feet): 100 Feet Assistive device: Rollator (4 wheels) Gait Pattern/deviations: Step-through pattern, Shuffle   Gait velocity interpretation: <1.8 ft/sec, indicate of risk for recurrent falls   General Gait Details: stopped and sat on the rollator once fatigued, O2 sats did drop to 87% on 3 L O2 [place], but pt recovered in <30 seconds with PLB.  Stairs Stairs: Yes Stairs assistance: Contact guard assist Stair Management: One rail Left, Step to pattern, Forwards Number of Stairs: 5 General stair comments: Pt was able to demonstrate, slow, safe progress up and down 5 steps.  she has about 3xs as many to get up to access the full kitchen at her home.  Wheelchair Mobility     Tilt Bed    Modified Rankin (Stroke Patients Only)       Balance Overall balance assessment: Mild deficits observed, not formally tested                                           Pertinent Vitals/Pain Pain Assessment Pain Assessment: Faces Faces Pain Scale: Hurts even more Pain Location: chronic pain R knee Pain Descriptors / Indicators: Grimacing, Guarding Pain Intervention(s): Limited activity within patient's tolerance, Monitored during session, Repositioned    Home  Living Family/patient expects to be discharged to:: Private residence Living Arrangements: Children Available Help at Discharge: Family;Available PRN/intermittently Type of Home: House Home Access: Level entry     Alternate Level Stairs-Number of Steps: 13 Home Layout: Multi-level;Able to live on main level with bedroom/bathroom Home Equipment: Cane - single point;Rolling Walker (2 wheels);Rollator (4 wheels);Shower seat;Toilet riser;Grab bars - tub/shower;Hand held shower head Additional Comments: pt  resides with her daughter, son in law, and grandchildren, left alone for only a few hours/day and family able to provide assistance if needed when around. Pt able to stay on main level but occasionally goes upstairs which is where her kitchen is as she enjoys cooking, but has a mini fridge and microwave downstairs when she doesn't feel up to doing the steps    Prior Function Prior Level of Function : Independent/Modified Independent;Driving             Mobility Comments: pt drives ot HD (only 7 miles away per pt) and is able to walk in from her parked car outside.       Extremity/Trunk Assessment   Upper Extremity Assessment Upper Extremity Assessment: Defer to OT evaluation    Lower Extremity Assessment Lower Extremity Assessment: Generalized weakness;RLE deficits/detail;LLE deficits/detail RLE Deficits / Details: right knee with significant arthritis per pt, she avoids going up the stairs leading with this side. LLE Deficits / Details: generally weak, at least 3/5 per gross functional assessment.    Cervical / Trunk Assessment Cervical / Trunk Assessment: Back Surgery (h/o lumbar fusion)  Communication   Communication Communication: No apparent difficulties  Cognition Arousal: Alert Behavior During Therapy: WFL for tasks assessed/performed Overall Cognitive Status: Within Functional Limits for tasks assessed                                          General Comments      Exercises     Assessment/Plan    PT Assessment Patient needs continued PT services  PT Problem List Decreased strength;Decreased range of motion;Decreased activity tolerance;Decreased mobility;Decreased balance;Decreased knowledge of use of DME;Cardiopulmonary status limiting activity;Pain       PT Treatment Interventions DME instruction;Gait training;Stair training;Functional mobility training;Therapeutic activities;Balance training;Therapeutic exercise;Patient/family  education;Neuromuscular re-education;Modalities    PT Goals (Current goals can be found in the Care Plan section)  Acute Rehab PT Goals Patient Stated Goal: to get stronger so she can get back to walking up the stairs to cook for her family PT Goal Formulation: With patient Time For Goal Achievement: 10/05/22 Potential to Achieve Goals: Good    Frequency Min 1X/week     Co-evaluation               AM-PAC PT "6 Clicks" Mobility  Outcome Measure Help needed turning from your back to your side while in a flat bed without using bedrails?: A Little Help needed moving from lying on your back to sitting on the side of a flat bed without using bedrails?: A Little Help needed moving to and from a bed to a chair (including a wheelchair)?: A Little       6 Click Score: 9    End of Session Equipment Utilized During Treatment: Gait belt;Oxygen Activity Tolerance: Patient limited by fatigue Patient left: in bed;with call bell/phone within reach;with bed alarm set;with family/visitor present   PT Visit Diagnosis: Unsteadiness on feet (R26.81);Muscle weakness (generalized) (M62.81);Pain Pain - Right/Left:  Right Pain - part of body: Knee    Time: 1610-9604 PT Time Calculation (min) (ACUTE ONLY): 46 min   Charges:   PT Evaluation $PT Eval Moderate Complexity: 1 Mod PT Treatments $Gait Training: 23-37 mins PT General Charges $$ ACUTE PT VISIT: 1 Visit        Corinna Capra, PT, DPT  Acute Rehabilitation Secure chat is best for contact #[phone] office

## 2022-09-21 NOTE — Progress Notes (Signed)
PT Cancellation Note  Patient Details Name: Jodi Bell MRN: [id number] DOB: 01-29-1961   Cancelled Treatment:    Reason Eval/Treat Not Completed: Other (comment).  Pt is currently working with OT.  PT will check back later as time allows.  Thanks,  Corinna Capra, PT, DPT  Acute Rehabilitation Secure chat is best for contact #[phone] office      Dimas Aguas 09/21/2022, 1:54 PM

## 2022-09-22 DIAGNOSIS — G9341 Metabolic encephalopathy: Secondary | ICD-10-CM | POA: Diagnosis not present

## 2022-09-22 DIAGNOSIS — E162 Hypoglycemia, unspecified: Secondary | ICD-10-CM | POA: Diagnosis not present

## 2022-09-22 LAB — CBC
HCT: 35.2 % — ABNORMAL LOW (ref 36.0–46.0)
Hemoglobin: 10.7 g/dL — ABNORMAL LOW (ref 12.0–15.0)
MCH: 31.8 pg (ref 26.0–34.0)
MCHC: 30.4 g/dL (ref 30.0–36.0)
MCV: 104.5 fL — ABNORMAL HIGH (ref 80.0–100.0)
Platelets: 122 10*3/uL — ABNORMAL LOW (ref 150–400)
RBC: 3.37 MIL/uL — ABNORMAL LOW (ref 3.87–5.11)
RDW: 14.7 % (ref 11.5–15.5)
WBC: 3.1 10*3/uL — ABNORMAL LOW (ref 4.0–10.5)
nRBC: 0 % (ref 0.0–0.2)

## 2022-09-22 LAB — RENAL FUNCTION PANEL
Albumin: 3.1 g/dL — ABNORMAL LOW (ref 3.5–5.0)
Anion gap: 13 (ref 5–15)
BUN: 24 mg/dL — ABNORMAL HIGH (ref 8–23)
CO2: 23 mmol/L (ref 22–32)
Calcium: 9 mg/dL (ref 8.9–10.3)
Chloride: 93 mmol/L — ABNORMAL LOW (ref 98–111)
Creatinine, Ser: 5.56 mg/dL — ABNORMAL HIGH (ref 0.44–1.00)
GFR, Estimated: 8 mL/min — ABNORMAL LOW (ref >60)
Glucose, Bld: 115 mg/dL — ABNORMAL HIGH (ref 70–99)
Phosphorus: 3.8 mg/dL (ref 2.5–4.6)
Potassium: 4.7 mmol/L (ref 3.5–5.1)
Sodium: 129 mmol/L — ABNORMAL LOW (ref 135–145)

## 2022-09-22 LAB — GLUCOSE, CAPILLARY
Glucose-Capillary: 100 mg/dL — ABNORMAL HIGH (ref 70–99)
Glucose-Capillary: 111 mg/dL — ABNORMAL HIGH (ref 70–99)
Glucose-Capillary: 135 mg/dL — ABNORMAL HIGH (ref 70–99)
Glucose-Capillary: 140 mg/dL — ABNORMAL HIGH (ref 70–99)
Glucose-Capillary: 165 mg/dL — ABNORMAL HIGH (ref 70–99)
Glucose-Capillary: 86 mg/dL (ref 70–99)

## 2022-09-22 MED ORDER — NIFEDIPINE ER OSMOTIC RELEASE 60 MG PO TB24
60.0000 mg | ORAL_TABLET | Freq: Two times a day (BID) | ORAL | Status: DC
  Administered 2022-09-23: 60 mg via ORAL
  Filled 2022-09-22 (×3): qty 1

## 2022-09-22 MED ORDER — HYDRALAZINE HCL 20 MG/ML IJ SOLN
20.0000 mg | INTRAMUSCULAR | Status: DC | PRN
  Administered 2022-09-22 – 2022-09-23 (×2): 20 mg via INTRAVENOUS
  Filled 2022-09-22 (×2): qty 1

## 2022-09-22 MED ORDER — POLYVINYL ALCOHOL 1.4 % OP SOLN
1.0000 [drp] | OPHTHALMIC | Status: DC | PRN
  Administered 2022-09-22: 1 [drp] via OPHTHALMIC
  Filled 2022-09-22: qty 15

## 2022-09-22 MED ORDER — PREDNISONE 20 MG PO TABS
20.0000 mg | ORAL_TABLET | Freq: Every day | ORAL | Status: DC
  Administered 2022-09-23: 20 mg via ORAL
  Filled 2022-09-22: qty 1

## 2022-09-22 MED ORDER — NIFEDIPINE ER OSMOTIC RELEASE 60 MG PO TB24
60.0000 mg | ORAL_TABLET | Freq: Every day | ORAL | Status: DC
  Filled 2022-09-22: qty 1

## 2022-09-22 MED ORDER — HYDRALAZINE HCL 20 MG/ML IJ SOLN
10.0000 mg | INTRAMUSCULAR | Status: DC | PRN
  Administered 2022-09-22 (×2): 10 mg via INTRAVENOUS
  Filled 2022-09-22 (×2): qty 1

## 2022-09-22 MED ORDER — ISOSORBIDE MONONITRATE ER 60 MG PO TB24
60.0000 mg | ORAL_TABLET | Freq: Once | ORAL | Status: AC
  Administered 2022-09-22: 60 mg via ORAL
  Filled 2022-09-22: qty 1

## 2022-09-22 NOTE — Progress Notes (Signed)
TR[facility] night cross cover note:   I was notified by RN of the patient's most recent blood pressure of 208/79 relative to 182/75 on day shift.  Corresponding heart rates in the high 60s to low 70s.  No report of any associated acute focal neurologic deficits at this time.  I subsequent placed order for prn IV hydralazine for systolic blood pressure greater than 180 mmHg.      Newton Pigg, DO Hospitalist

## 2022-09-22 NOTE — Progress Notes (Signed)
[facility] Progress Note   Subjective:  Feels okay but hypertensive. More clear today  Objective Vitals:   09/22/22 0511 09/22/22 0613 09/22/22 0748 09/22/22 0927  BP: (!) 204/67 (!) 165/62  (!) 176/63  Pulse: 69 68  67  Resp: 18 18  20   Temp: 98.6 F (37 C) 98.9 F (37.2 C)  98.7 F (37.1 C)  TempSrc: Oral Oral  Oral  SpO2: 100% 99% 99% 100%  Weight:      Height:       Physical Exam General: Alert female in NAD Heart: normal rate, no rub Lungs: bl chest rise, no iwob Abdomen: Soft, +BS Extremities: No edema b/l lower extremities, warm and well perfused Dialysis Access: LUE AVF  Additional Objective Labs: Basic Metabolic Panel: Recent Labs  Lab 09/20/22 0749 09/21/22 0252 09/22/22 0040  NA 134* 133* 132*  K 4.0 3.8 4.1  CL 95* 94* 94*  CO2 26 28 27   GLUCOSE 87 96 140*  BUN 22 12 21   CREATININE 5.94* 3.94* 4.99*  CALCIUM 8.3* 8.4* 8.9   Liver Function Tests: Recent Labs  Lab 09/19/22 0916 09/20/22 0749  AST 16 16  ALT 5 7  ALKPHOS 130* 108  BILITOT 0.4 0.4  PROT 6.9 6.4*  ALBUMIN 4.0 3.2*   Recent Labs  Lab 09/19/22 0916  LIPASE 28   CBC: Recent Labs  Lab 09/19/22 0916 09/19/22 2116 09/20/22 0906 09/21/22 0252 09/22/22 0040  WBC 3.7* 4.4 3.7* 2.0* 1.6*  NEUTROABS  --   --  1.8  --   --   HGB 12.8 11.9* 11.9* 10.4* 10.0*  HCT 42.8 40.7 39.7 35.0* 33.3*  MCV 105.7* 110.3* 108.8* 106.4* 103.4*  PLT 129* 121* 114* 108* 111*   Blood Culture    Component Value Date/Time   SDES BLOOD SITE NOT SPECIFIED 06/26/2021 0650   SPECREQUEST  06/26/2021 0650    BOTTLES DRAWN AEROBIC AND ANAEROBIC Blood Culture results may not be optimal due to an excessive volume of blood received in culture bottles   CULT  06/26/2021 0650    NO GROWTH 5 DAYS Performed at [facility], [address], [place], [place] [address]    REPTSTATUS 07/01/2021 FINAL 06/26/2021 0650    Cardiac Enzymes: No results for input(s): "CKTOTAL", "CKMB",  "CKMBINDEX", "TROPONINI" in the last 168 hours. CBG: Recent Labs  Lab 09/21/22 1628 09/21/22 2022 09/22/22 0002 09/22/22 0503 09/22/22 0941  GLUCAP 265* 145* 135* 100* 140*   Iron Studies: No results for input(s): "IRON", "TIBC", "TRANSFERRIN", "FERRITIN" in the last 72 hours. @lablastinr3 @ Studies/Results: MR BRAIN WO CONTRAST  Result Date: 09/20/2022 CLINICAL DATA:  Mental status change. EXAM: MRI HEAD WITHOUT CONTRAST TECHNIQUE: Multiplanar, multiecho pulse sequences of the brain and surrounding structures were obtained without intravenous contrast. The examination had to be discontinued prior to completion due to patient refusal for further imaging. Only diffusion-weighted imaging and a highly motion degraded axial FLAIR sequence were completed. COMPARISON:  MR head without contrast 05/02/2021 FINDINGS: Brain: The diffusion-weighted images demonstrate no acute or subacute infarction. Moderate periventricular subcortical white matter disease is again noted. The patient motion precludes other significant diagnostic interpretation of these images. IMPRESSION: 1. No acute or subacute infarction. 2. Moderate periventricular subcortical white matter disease is again noted. 3. The patient motion precludes other significant diagnostic interpretation of these images. 4. The study was terminated prematurely due to the patient's discontinuing the examination. Electronically Signed   By: Marin Roberts M.D.   On: 09/20/2022  17:34   Medications:   allopurinol  100 mg Oral QPM   arformoterol  15 mcg Nebulization BID   And   umeclidinium bromide  1 puff Inhalation Daily   budesonide (PULMICORT) nebulizer solution  0.25 mg Nebulization BID   carvedilol  25 mg Oral BID WC   Chlorhexidine Gluconate Cloth  6 each Topical Q0600   Chlorhexidine Gluconate Cloth  6 each Topical Q0600   cinacalcet  60 mg Oral Q M,W,F-HD   cloNIDine  0.3 mg Transdermal Weekly   heparin  5,000 Units Subcutaneous Q8H    hydrALAZINE  100 mg Oral TID   insulin aspart  0-15 Units Subcutaneous Q4H   isosorbide mononitrate  60 mg Oral Daily   montelukast  10 mg Oral Q supper   multivitamin  1 tablet Oral Q M,W,F-2000   NIFEdipine  60 mg Oral Daily   pantoprazole  40 mg Oral BID   predniSONE  30 mg Oral Q breakfast    OP Dialysis Orders: [facility] MWF [address]  3.5h   [facility]  2K/3Ca bath  Hep none  L AVF - zemplar 8.5 mcg IV - sensipar 60mg  tiw  Assessment/Plan: AMS - hypoglycemia and sedating meds. improved Pulm edema - seen on CXR and exam. Had extra HD Saturday. Continue to push UF as tolerated. Likely needs new EDW (typically only get 2.5L off in a day ESRD - on HD MWF. HTN - mainly needs to optimize volume. On clonidine 0.3mg  weekly, hydral 100mg  tid, coreg 25mg  BID, imdure 60mg  daily, nifedipine 60mg  daily. Consider ARB PAF- on carvedilol and nifedipine Chronic resp failure- on home O2 at baseline Anemia- Hgb 10.4, no ESA indicated at this time Secondary hyperparathyroidism; Ca controlled, will check phos with next labs

## 2022-09-22 NOTE — Progress Notes (Signed)
Received patient in bed.Awake,alert and oriented x 4 .Consent verified.  Access used : Left upper arm AVF that worked well.  Duration of treatment  1.44 hours.  Fluid removed : 1,100 CC.  Hemo comment: Quit midway to her treatment,signed an AMA .'' I am tired,its not my day today.  Hand off to the patient's nurse.

## 2022-09-22 NOTE — TOC Transition Note (Addendum)
Transition of Care [facility]) - CM/SW Discharge Note   Patient Details  Name: Jodi Bell MRN: [id number] Date of Birth: 21-Jan-1961  Transition of Care [facility]) CM/SW Contact:  Kermit Balo, RN Phone Number: 09/22/2022, 12:11 PM   Clinical Narrative:    Plan is for patient to d/c home today after Hemodialysis. Pt lives with her daughter that works during the daytime. Pt is alone at home on days she isnt in HD.  Pt drives self as needed.  She uses oxygen at home and says its through [employer]. Other DME at home:wheelchair/ cane/ BSC/ grab bars. Pt manages her own medications and denies any issues.  Home health therapy arranged with [facility] per pt choice. Information on the AVS.  Pt states she son will transport her home when discharged.   1500: pt unable to receive a new rollator under her insurance as she got a wheelchair in May. Pt aware and will look at getting a new one as private pay.    Final next level of care: Home w Home Health Services Barriers to Discharge: No Barriers Identified   Patient Goals and CMS Choice CMS Medicare.gov Compare Post Acute Care list provided to:: Patient Choice offered to / list presented to : Patient  Discharge Placement                         Discharge Plan and Services Additional resources added to the After Visit Summary for                  DME Arranged: Walker rolling with seat DME Agency: [employer] Date DME Agency Contacted: 09/22/22   Representative spoke with at DME Agency: Zack HH Arranged: PT, OT HH Agency: [facility] Date [facility] Agency Contacted: 09/22/22   Representative spoke with at [facility] Agency: Tresa Endo  Social Determinants of Health (SDOH) Interventions SDOH Screenings   Food Insecurity: No Food Insecurity (09/20/2022)  Housing: Patient Declined (09/20/2022)  Transportation Needs: No Transportation Needs (09/20/2022)  Utilities: Not At Risk (09/20/2022)  Financial Resource Strain: Low Risk  (06/12/2022)    Received from [facility], [facility]  Recent Concern: Financial Resource Strain - Medium Risk (04/29/2022)   Received from [facility]  Physical Activity: Inactive (09/15/2018)  Social Connections: Unknown (06/23/2021)   [facility]  Stress: Stress Concern Present (07/02/2022)   Received from [facility], [facility]  Tobacco Use: Medium Risk (09/19/2022)     Readmission Risk Interventions    07/23/2021    3:40 PM 06/28/2021    5:10 PM  Readmission Risk Prevention Plan  Transportation Screening Complete Complete  Medication Review [employer]) Complete Complete  PCP or Specialist appointment within 3-5 days of discharge Complete   HRI or Home Care Consult Complete Complete  SW Recovery Care/Counseling Consult Complete   Palliative Care Screening Not Applicable Not Applicable  Skilled Nursing Facility Not Applicable Not Applicable

## 2022-09-22 NOTE — Progress Notes (Signed)
TRH night cross cover note:   I was notified by RN that this patient's blood pressure is running a little bit high at 210/70 with corresponding heart rates in the 60s.  No report of any associated new symptoms with this elevated blood pressure.  She is reported to have undergone an incomplete hemodialysis session today, and leading up to dialysis received dose of Coreg as well as hydralazine, but, per reported nephrology plan, predialysis dose of Imdur 60 mg p.o. daily was held, as well as dose of nifedipine.  Per evening signout, we are to continue to hold nifedipine this evening.   The patient just received her scheduled evening Coreg 25 mg p.o. twice daily, and is scheduled for her next dose of hydralazine 100 mg p.o. 3 times daily to occur 2200 this evening.  Additionally, she has an existing order for hydralazine 10 mg IV every 4 hours as needed for systolic blood pressure greater than 180 mmHg.   Noting that her morning dose of Imdur was held, I have placed an order for Imdur 60 mg p.o. x 1 dose now.  I have also escalated the existing prn IV hydralazine order to reflect the following: Hydralazine 20 mg IV every 4 hours as needed for systolic blood pressures greater than 180 mmHg.  Will follow ensuing trend in blood pressure and response to the above interventions.      Newton Pigg, DO Hospitalist

## 2022-09-22 NOTE — Progress Notes (Signed)
D/C order noted. Per D/C summary pt is hypertensive and to receive further fluid adjustment with HD prior to d/c. Contacted [facility] to advise clinic of pt's d/c today and that pt should resume care on Wednesday. Clinic has access to [facility] for clinicals for continuation of care.   Olivia Canter Renal Navigator [phone]

## 2022-09-22 NOTE — Discharge Summary (Signed)
Physician Discharge Summary  Jodi Bell ZOX:[id number] DOB: [birth year] DOA: 09/19/2022  PCP: Malka So., MD  Admit date: 09/19/2022 Discharge date: 09/22/2022 Recommendations for Outpatient Follow-up:  Follow up with PCP in 1 weeks-call for appointment Please obtain BMP/CBC in one week  Discharge Dispo: home w/ [facility] Discharge Condition: Stable Code Status:   Code Status: Full Code Diet recommendation:  Diet Order             Diet heart healthy/carb modified Room service appropriate? Yes; Fluid consistency: Thin  Diet effective now                    Brief/Interim Summary: 62yof w/ ESRD, CAD, HTN, anemia, chronic back pain, GERD, COPD, chronic respiratory failure, OSA, PAF, chronic diastolic heart failure presented to the ED for evaluation of confusion, abdominal pain. 2 days PTA patient took her oxycodone, and 1 day PTA took her Ativan. Daughter reports patient had similar confusion in the past in May at [facility] UTI encephalopathy and severe symptomatic anemia needing transfusion.  Per daughter patient may be a little urine, has eaten very little in past 24 hours PTA had headache too. CT abdomen pelvis with contrast at noon> developing wall thickening along the transverse colon possible colitis increasing stranding ascites and anasarca extensive colonic diverticuli, distended gallbladder with some wall thickening-advised to correlate for any clinical symptoms of acute gallbladder pathology. Patient was treated with dextrose pain medication nausea medication. patient was discharged.  Patient went to her scheduled hemodialysis.  She had only 2 hours of hemodialysis today as patient's behavior remained altered.  When her son picked her up patient was listless, confused and fatigued.  She continued to complain of abdominal pain. She was arousable and had persistent hypoglycemia ,Hemoglobin A1c 4.5.Chest x-ray at 23:33> with cardiomegaly with interstitial edema. MRI limited as  patient was noncompliant however no acute finding noticed. Her mentation was normalized as of blood sugar improved-likely multifactorial in the setting of hypoglycemia and also psychiatric medication advised not to take Neurontin at home. Underwent extra HD 8/10-8/11 am per nephrology Weaned off IV dextrose and blood sugar has stabilized, tolerating diet. At this time she is alert awake oriented, back to baseline. Blood pressure started to trend up Home antihypertensives were resumed. Continue with PT OT, see by PTOT 812 again and planning for home w/ [facility]     Discharge Diagnoses:  Principal Problem:   Acute metabolic encephalopathy due to hypoglycemia Active Problems:   Essential hypertension   Psoriasis arthropathica (HCC)   COPD (chronic obstructive pulmonary disease) (HCC)   Tobacco use   ESRD (end stage renal disease) (HCC)   OSA (obstructive sleep apnea)   Chronic diastolic CHF (congestive heart failure) (HCC)   Paroxysmal atrial fibrillation (HCC)   Chronic respiratory failure with hypoxia (HCC)   Acute metabolic encephalopathy   Uncontrolled type 2 diabetes mellitus with hypoglycemia, with long-term current use of insulin (HCC)  Acute metabolic encephalopathy: Likely due to hypoglycemia-in polypharmacy, mentation improved back to baseline MRI brain although limited no acute finding.  Continue precaution fall precaution avoid polypharmacy/sedatives.  Hypoglycemia: Likely from poor oral intake, random cortisol low suspect in the setting of chronic steroid use of IV fluids, tolerating diet.  Hypoglycemia resolved Recent Labs  Lab 09/20/22 0017 09/20/22 0145 09/21/22 2022 09/22/22 0002 09/22/22 0503 09/22/22 0941 09/22/22 1210  GLUCAP  --    < > 145* 135* 100* 140* 111*  HGBA1C 4.5*  --   --   --   --   --   --    < > =  values in this interval not displayed.    Chronic diastolic CHF ESRD on HD on MWF: Nephro input appreciated underwent HD stable to manage volume  status, hypertensive and working on further fluid adjustment with dialysis 8/12 and then dc home.  Hypertension: Blood pressure poorly controlled.  Continue clonidine 0.3 mg weekly, Imdur, hydralazine Coreg resume nifedipine bid.  Adjusting fluid with dialysis 8/12  Chronic respiratory failure with hypoxia COPD: Patient on chronic steroid> received  stress dose now on prednisone weaning down to home dose Continue Brovana, Incruse Ellipta and Pulmicort, as needed nebulizer  PAF: Continue Coreg.  Not on anticoagulation  Psoriatic arthropathy On daily prednisone  Thrombocytopenia: On lower side, monitor Recent Labs  Lab 09/19/22 0916 09/19/22 2116 09/20/22 0906 09/21/22 0252 09/22/22 0040  PLT 129* 121* 114* 108* 111*    Deconditioning/debility: Request PT OT evaluation for disposition  Consults: Nephrology Subjective: Alert awake oriented resting  Discharge Exam: Vitals:   09/22/22 0927 09/22/22 1208  BP: (!) 176/63 (!) 161/70  Pulse: 67 64  Resp: 20 16  Temp: 98.7 F (37.1 C) 98.6 F (37 C)  SpO2: 100% 99%   General: Pt is alert, awake, not in acute distress Cardiovascular: RRR, S1/S2 +, no rubs, no gallops Respiratory: CTA bilaterally, no wheezing, no rhonchi Abdominal: Soft, NT, ND, bowel sounds + Extremities: no edema, no cyanosis  Discharge Instructions  Discharge Instructions     Ambulatory Referral for Lung Cancer Scre   Complete by: As directed    Discharge instructions   Complete by: As directed    \Please call call MD or return to ER for similar or worsening recurring problem that brought you to hospital or if any fever,nausea/vomiting,abdominal pain, uncontrolled pain, chest pain,  shortness of breath or any other alarming symptoms.  Please follow-up your doctor as instructed in a week time and call the office for appointment.  Please avoid alcohol, smoking, or any other illicit substance and maintain healthy habits including taking your  regular medications as prescribed.  You were cared for by a hospitalist during your hospital stay. If you have any questions about your discharge medications or the care you received while you were in the hospital after you are discharged, you can call the unit and ask to speak with the hospitalist on call if the hospitalist that took care of you is not available.  Once you are discharged, your primary care physician will handle any further medical issues. Please note that NO REFILLS for any discharge medications will be authorized once you are discharged, as it is imperative that you return to your primary care physician (or establish a relationship with a primary care physician if you do not have one) for your aftercare needs so that they can reassess your need for medications and monitor your lab values   Increase activity slowly   Complete by: As directed    No wound care   Complete by: As directed       Allergies as of 09/22/2022       Reactions   3-methyl-2-benzothiazolinone Hydrazone Other (See Comments)   Muscle weakness muscle cramping in legs Other reaction(s): Myalgias (Muscle Pain)   Banana Anaphylaxis, Swelling, Other (See Comments)   TONGUE AND MOUTH SWELLING   Black Walnut Flavor Anaphylaxis, Itching   Walnuts   Hazelnut (filbert) Anaphylaxis   Hazelnuts   Leflunomide And Related Other (See Comments)   Severe Headache   Lisinopril Swelling   Angioedema  Swelling of the face  No Healthtouch Food Allergies Anaphylaxis   Spicy Mustard 4.7.2021 Pt reports that she eats Regular Yellow Mustard Swelling and itching of tongue   Other Anaphylaxis, Swelling, Other (See Comments)   Cosentyx= angioedema Mouth itches and tongue swelling Hazel nuts and pecans also Walnuts Renal failure Serotonin syndrome  Tremors Muscle weakness muscle cramping in legs Other reaction(s): Myalgias (Muscle Pain)   Pecan Nut (diagnostic) Anaphylaxis, Itching   Pecans   Trazodone And  Nefazodone Other (See Comments)   Serotonin Syndrome  Tremors   Adalimumab Other (See Comments)   Blisters on abdomen =humira   Escitalopram Oxalate Other (See Comments)   Hand problems - Serotonin Syndrome    Ezetimibe Other (See Comments)   myalgias cramps   Secukinumab Swelling   Cosentyx = angiodema   Statins Other (See Comments)   Leg pains and weakness   Ferrlecit [na Ferric Gluc Cplx In Sucrose]    Not reaction given from HD   Gabapentin Other (See Comments)   tremors   Nsaids    Avoid due to renal problems         Medication List     STOP taking these medications    cyclobenzaprine 10 MG tablet Commonly known as: FLEXERIL       TAKE these medications    acetaminophen 500 MG tablet Commonly known as: TYLENOL Take 1,000 mg by mouth daily as needed (pain).   albuterol (2.5 MG/3ML) 0.083% nebulizer solution Commonly known as: PROVENTIL Take 3 mLs (2.5 mg total) by nebulization every 6 (six) hours as needed for wheezing or shortness of breath.   Ventolin HFA 108 (90 Base) MCG/ACT inhaler Generic drug: albuterol Inhale 2 puffs into the lungs See admin instructions. Take 2 puffs every 4-6 hours as needed for wheezing and shortness of breath   allopurinol 100 MG tablet Commonly known as: ZYLOPRIM Take 100 mg by mouth every evening.   carboxymethylcellulose 1 % ophthalmic solution Place 1 drop into both eyes 3 (three) times daily.   carvedilol 25 MG tablet Commonly known as: COREG Take 25 mg by mouth 2 (two) times daily with a meal.   cetirizine 10 MG tablet Commonly known as: ZYRTEC Take 10 mg by mouth daily as needed for allergies.   cinacalcet 30 MG tablet Commonly known as: SENSIPAR Take 60 mg by mouth every Monday, Wednesday, and Friday with hemodialysis.   cloNIDine 0.3 mg/24hr patch Commonly known as: CATAPRES - Dosed in mg/24 hr Place 0.3 mg onto the skin every Sunday.   diclofenac Sodium 1 % Gel Commonly known as: VOLTAREN Apply 2 g  topically daily as needed (pain).   docusate sodium 100 MG capsule Commonly known as: COLACE Take 100 mg by mouth daily.   EPINEPHrine 0.3 mg/0.3 mL Soaj injection Commonly known as: EPI-PEN Inject into the muscle as needed for anaphylaxis.   fluticasone 50 MCG/ACT nasal spray Commonly known as: FLONASE Place 1 spray into both nostrils daily as needed for allergies or rhinitis.   hydrALAZINE 100 MG tablet Commonly known as: APRESOLINE Take 1 tablet (100 mg total) by mouth every 8 (eight) hours.   isosorbide mononitrate 60 MG 24 hr tablet Commonly known as: IMDUR Take 1 tablet (60 mg total) by mouth daily.   LORazepam 1 MG tablet Commonly known as: ATIVAN Take 1 mg by mouth every 8 (eight) hours as needed for anxiety.   montelukast 10 MG tablet Commonly known as: SINGULAIR Take 10 mg by mouth daily with supper.   multivitamin Tabs tablet  Take 1 tablet by mouth every Monday, Wednesday, and Friday.   NIFEdipine 60 MG 24 hr tablet Commonly known as: PROCARDIA XL/NIFEDICAL XL Take 60 mg by mouth 2 (two) times daily.   olmesartan 40 MG tablet Commonly known as: BENICAR Take 40 mg by mouth every evening.   ondansetron 4 MG tablet Commonly known as: ZOFRAN Take 4 mg by mouth 2 (two) times daily as needed for nausea or vomiting.   OXYGEN Inhale 2-3 L into the lungs as directed.   pantoprazole 40 MG tablet Commonly known as: PROTONIX Take 40 mg by mouth 2 (two) times daily.   predniSONE 5 MG tablet Commonly known as: DELTASONE Take 1 tablet (5 mg total) by mouth daily with breakfast.   PROBIOTIC PO Take 1 capsule by mouth daily.   Proctozone-HC 2.5 % rectal cream Generic drug: hydrocortisone Place rectally 2 (two) times daily. What changed:  how much to take when to take this reasons to take this   silver sulfADIAZINE 1 % cream Commonly known as: SILVADENE Apply 1 Application topically daily as needed (wound care).   Soothe Nighttime Oint Place 1  Application into both eyes at bedtime.   Stiolto Respimat 2.5-2.5 MCG/ACT Aers Generic drug: Tiotropium Bromide-Olodaterol Inhale 2 puffs once daily into the lungs.   SUPER B COMPLEX PO Take 1 tablet by mouth 4 (four) times a week. Take on Sun, Tues, Thurs, and Sat   Vitamin D 50 MCG (2000 UT) tablet Take 2,000 Units by mouth daily.   zolpidem 5 MG tablet Commonly known as: AMBIEN Take 5 mg by mouth at bedtime as needed for sleep.               Durable Medical Equipment  (From admission, onward)           Start     Ordered   09/22/22 1154  For home use only DME 4 wheeled rolling walker with seat  Once       Question:  Patient needs a walker to treat with the following condition  Answer:  Weakness   09/22/22 1154            Follow-up Information     Malka So., MD Follow up in 1 week(s).   Specialty: Internal Medicine Contact information: [address] [place] [address] [phone]         [facility] Follow up.   Specialty: Home Health Services Why: The home health agency will contact you for the first home visit. Contact information: [address] [place] [address] [phone]                Allergies  Allergen Reactions   3-Methyl-2-Benzothiazolinone Hydrazone Other (See Comments)    Muscle weakness muscle cramping in legs Other reaction(s): Myalgias (Muscle Pain)   Banana Anaphylaxis, Swelling and Other (See Comments)    TONGUE AND MOUTH SWELLING   Black Walnut Flavor Anaphylaxis and Itching    Walnuts   Hazelnut (Filbert) Anaphylaxis    Hazelnuts   Leflunomide And Related Other (See Comments)    Severe Headache   Lisinopril Swelling    Angioedema  Swelling of the face   No Healthtouch Food Allergies Anaphylaxis    Spicy Mustard 4.7.2021 Pt reports that she eats Regular Yellow Mustard Swelling and itching of tongue   Other Anaphylaxis, Swelling and Other (See Comments)     Cosentyx= angioedema Mouth itches and tongue swelling Hazel nuts and pecans  also Walnuts Renal failure Serotonin syndrome  Tremors Muscle weakness muscle cramping in legs Other reaction(s): Myalgias (Muscle Pain)   Pecan Nut (Diagnostic) Anaphylaxis and Itching    Pecans   Trazodone And Nefazodone Other (See Comments)    Serotonin Syndrome  Tremors   Adalimumab Other (See Comments)    Blisters on abdomen =humira   Escitalopram Oxalate Other (See Comments)    Hand problems - Serotonin Syndrome     Ezetimibe Other (See Comments)    myalgias cramps    Secukinumab Swelling    Cosentyx = angiodema    Statins Other (See Comments)    Leg pains and weakness   Ferrlecit [Na Ferric Gluc Cplx In Sucrose]     Not reaction given from HD   Gabapentin Other (See Comments)    tremors   Nsaids     Avoid due to renal problems     The results of significant diagnostics from this hospitalization (including imaging, microbiology, ancillary and laboratory) are listed below for reference.    Microbiology: Recent Results (from the past 240 hour(s))  SARS Coronavirus 2 by RT PCR (hospital order, performed in [facility] hospital lab) *cepheid single result test* Anterior Nasal Swab     Status: None   Collection Time: 09/19/22 11:29 PM   Specimen: Anterior Nasal Swab  Result Value Ref Range Status   SARS Coronavirus 2 by RT PCR NEGATIVE NEGATIVE Final    Comment: Performed at [facility], [address], [place], [place] [address]    Procedures/Studies: MR BRAIN WO CONTRAST  Result Date: 09/20/2022 CLINICAL DATA:  Mental status change. EXAM: MRI HEAD WITHOUT CONTRAST TECHNIQUE: Multiplanar, multiecho pulse sequences of the brain and surrounding structures were obtained without intravenous contrast. The examination had to be discontinued prior to completion due to patient refusal for further imaging. Only diffusion-weighted imaging and a highly motion degraded axial FLAIR sequence  were completed. COMPARISON:  MR head without contrast 05/02/2021 FINDINGS: Brain: The diffusion-weighted images demonstrate no acute or subacute infarction. Moderate periventricular subcortical white matter disease is again noted. The patient motion precludes other significant diagnostic interpretation of these images. IMPRESSION: 1. No acute or subacute infarction. 2. Moderate periventricular subcortical white matter disease is again noted. 3. The patient motion precludes other significant diagnostic interpretation of these images. 4. The study was terminated prematurely due to the patient's discontinuing the examination. Electronically Signed   By: Marin Roberts M.D.   On: 09/20/2022 17:34   DG CHEST PORT 1 VIEW  Result Date: 09/19/2022 CLINICAL DATA:  Confusion EXAM: PORTABLE CHEST 1 VIEW COMPARISON:  CT chest dated 07/24/2022 FINDINGS: Increased interstitial markings, favoring mild central interstitial edema. No definite pleural effusions. No pneumothorax. Cardiomegaly.  Postsurgical changes related to prior CABG. Median sternotomy. Cervical spine fixation hardware, incompletely visualized. IMPRESSION: Cardiomegaly with mild central interstitial edema. Electronically Signed   By: Charline Bills M.D.   On: 09/19/2022 23:33   CT ABDOMEN PELVIS W CONTRAST  Result Date: 09/19/2022 CLINICAL DATA:  Dialysis patient with developing abdominal pain and nausea. EXAM: CT ABDOMEN AND PELVIS WITH CONTRAST TECHNIQUE: Multidetector CT imaging of the abdomen and pelvis was performed using the standard protocol following bolus administration of intravenous contrast. RADIATION DOSE REDUCTION: This exam was performed according to the departmental dose-optimization program which includes automated exposure control, adjustment of the mA and/or kV according to patient size and/or use of iterative reconstruction technique. CONTRAST:  75mL OMNIPAQUE IOHEXOL 300 MG/ML  SOLN COMPARISON:  CT angiogram 07/24/2022.  Multiple  older CT examinations. FINDINGS: Lower chest: Heart is diffusely enlarged. Coronary artery calcifications are seen. Small pericardial effusion. Trace pleural fluid. Enlargement of the pulmonary vessels. Hepatobiliary: Contrast bolus is more late arterial. Incomplete opacification of the hepatic veins. Fatty liver infiltration identified. Distended gallbladder with wall thickening. Patent portal vein. Pancreas: Unremarkable. No pancreatic ductal dilatation or surrounding inflammatory changes. Spleen: Spleen is nonenlarged. There is low-attenuation lesion along the inferior aspect of the spleen measuring 7 mm on series 2, image 30. Too small to completely characterize but unchanged going back to multiple prior examinations. Nonaggressive in appearance. No additional follow-up. Adrenals/Urinary Tract: The adrenal glands are preserved. There is moderate atrophy of the kidneys with the extensive vascular calcifications extending into the hilum. 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 No specific imaging follow-up. No clearly enhancing mass at this time. The ureters have normal course and caliber extending down to the bladder. Bladder is contracted. Stomach/Bowel: No oral contrast. The stomach is mildly distended with fluid. The small bowel is nondilated diffusely. Normal appendix in the right lower quadrant. The large bowel demonstrates diffuse colonic diverticula. There is some wall thickening along the transverse colon. A colitis is possible. Colon is nondilated. Vascular/Lymphatic: Diffuse vascular calcifications identified. Normal caliber aorta and IVC. There are some enlarged retroperitoneal nodes identified. Example left para-on series 2, image 31 measures 19 by 16 mm. This was seen previously in retrospect and is unchanged. Further smaller nodes identified elsewhere along the retroperitoneum. Reproductive: Uterus and bilateral adnexa are unremarkable. Other: Increasing anasarca.   Mesenteric haziness and mild ascites. Musculoskeletal: Significant streak artifact related to the spinal fixation hardware along the lumbar spine with stable compression deformities stable endplate irregularity at the L2-3 level. Diffuse bony sclerosis identified related to the patient's known history of chronic renal failure. Multilevel stenosis along the spine. IMPRESSION: Developing wall thickening along the transverse colon. Possible colitis. Increasing stranding, ascites and anasarca. Extensive colonic diverticula. Tiny right pleural effusion. Distended gallbladder with some wall thickening. Please correlate for any clinical symptoms of acute gallbladder pathology and if needed follow up gallbladder ultrasound. Stable mildly enlarged retroperitoneal lymph node. Moderate renal atrophy. Electronically Signed   By: Karen Kays M.D.   On: 09/19/2022 11:56    Labs: BNP (last 3 results) Recent Labs    10/20/21 1609  BNP >4,500.0*   Basic Metabolic Panel: Recent Labs  Lab 09/19/22 0916 09/19/22 2116 09/20/22 0749 09/21/22 0252 09/22/22 0040  NA 137 134* 134* 133* 132*  K 4.2 4.0 4.0 3.8 4.1  CL 99 100 95* 94* 94*  CO2 25 24 26 28 27   GLUCOSE 41* 61* 87 96 140*  BUN 33* 22 22 12 21   CREATININE 6.53* 5.15* 5.94* 3.94* 4.99*  CALCIUM 9.5 8.7* 8.3* 8.4* 8.9  MG  --   --  2.0  --   --    Liver Function Tests: Recent Labs  Lab 09/19/22 0916 09/20/22 0749  AST 16 16  ALT 5 7  ALKPHOS 130* 108  BILITOT 0.4 0.4  PROT 6.9 6.4*  ALBUMIN 4.0 3.2*   Recent Labs  Lab 09/19/22 0916  LIPASE 28   Recent Labs  Lab 09/19/22 2225  AMMONIA 32  CBC: Recent Labs  Lab 09/19/22 0916 09/19/22 2116 09/20/22 0906 09/21/22 0252 09/22/22 0040  WBC 3.7* 4.4 3.7* 2.0* 1.6*  NEUTROABS  --   --  1.8  --   --   HGB 12.8 11.9* 11.9* 10.4* 10.0*  HCT 42.8 40.7 39.7 35.0* 33.3*  MCV 105.7* 110.3* 108.8* 106.4* 103.4*  PLT 129* 121* 114* 108* 111*   Recent Labs  Lab 09/21/22 2022  09/22/22 0002 09/22/22 0503 09/22/22 0941 09/22/22 1210  GLUCAP 145* 135* 100* 140* 111*   Recent Labs    09/20/22 0017  HGBA1C 4.5*   Recent Labs    09/20/22 0749  TSH 3.761  Anemia work up Recent Labs    09/20/22 0749  VITAMINB12 609  FOLATE 16.1  Urinalysis    Component Value Date/Time   COLORURINE YELLOW 09/12/2020 2333   APPEARANCEUR HAZY (A) 09/12/2020 2333   LABSPEC 1.012 09/12/2020 2333   PHURINE 8.0 09/12/2020 2333   GLUCOSEU NEGATIVE 09/12/2020 2333   HGBUR SMALL (A) 09/12/2020 2333   BILIRUBINUR NEGATIVE 09/12/2020 2333   KETONESUR NEGATIVE 09/12/2020 2333   PROTEINUR 100 (A) 09/12/2020 2333   UROBILINOGEN 0.2 09/05/2014 0013   NITRITE NEGATIVE 09/12/2020 2333   LEUKOCYTESUR NEGATIVE 09/12/2020 2333  Sepsis Labs Recent Labs  Lab 09/19/22 2116 09/20/22 0906 09/21/22 0252 09/22/22 0040  WBC 4.4 3.7* 2.0* 1.6*  Microbiology Recent Results (from the past 240 hour(s))  SARS Coronavirus 2 by RT PCR (hospital order, performed in [facility] hospital lab) *cepheid single result test* Anterior Nasal Swab     Status: None   Collection Time: 09/19/22 11:29 PM   Specimen: Anterior Nasal Swab  Result Value Ref Range Status   SARS Coronavirus 2 by RT PCR NEGATIVE NEGATIVE Final    Comment: Performed at [facility], [address], [place], [place] [address]   Time coordinating discharge: 35 minutes SIGNED: Lanae Boast, MD  [facility] 09/22/2022, 1:37 PM  If 7PM-7AM, please contact night-coverage [email or web address]

## 2022-09-22 NOTE — Progress Notes (Signed)
Physical Therapy Treatment Patient Details Name: Jodi Bell MRN: [id number] DOB: 1960/06/19 Today's Date: 09/22/2022   History of Present Illness Pt is a 62 y.o. female admitted on 09/19/22 for Confusion and abdominal pain.  Pt dx with acute metabolic encephalopathy likely due to hypoglycemia.  Pt with significant PMH of anemia of chronic disease, chronic back pain s/p lumbar fusion, COPD, DM, ESRD on HD, essential HTN, gout, PAD, aortic stenosis, mitral regurgitation, and L partial knee replacement.    PT Comments  Tolerated treatment well. Completed stair training with various approaches. Feels confident with this task. Completed gait training with rollator >100 feet x2 today with single seated rest break on 3L supplemental O2. Educated on energy conservation and awareness. Eager to go home after dialysis today. Will follow and progress until d/c. Remains appropriate for HHPT follow-up.    If plan is discharge home, recommend the following: A little help with walking and/or transfers;A little help with bathing/dressing/bathroom;Assistance with cooking/housework;Assist for transportation;Help with stairs or ramp for entrance   Can travel by private vehicle        Equipment Recommendations  Rollator (4 wheels) (Her rollator is reportedly in disrepair and unsafe to use at home)    Recommendations for Other Services       Precautions / Restrictions Precautions Precautions: Fall;Other (comment) Precaution Comments: monitor O2 sats with activity on 2-3 L O2 continous at baseline Restrictions Weight Bearing Restrictions: No     Mobility  Bed Mobility Overal bed mobility: Modified Independent             General bed mobility comments: Mod I with transitions. No assist needed    Transfers Overall transfer level: Needs assistance Equipment used: Rollator (4 wheels) Transfers: Sit to/from Stand Sit to Stand: Supervision           General transfer comment: Supervision for  safety, no instability noted with Rollator for light support.    Ambulation/Gait Ambulation/Gait assistance: Supervision Gait Distance (Feet): 110 Feet (x2) Assistive device: Rollator (4 wheels) Gait Pattern/deviations: Step-through pattern, Decreased stride length Gait velocity: decr Gait velocity interpretation: <1.8 ft/sec, indicate of risk for recurrent falls   General Gait Details: Ambulates with rollator at supervision level. Supplemental O2 on 3L today with mild dyspnea noted on return bout only. No overt instability noted with this device. Educated on energy conservation.   Stairs Stairs: Yes Stairs assistance: Supervision Stair Management: One rail Left, Step to pattern, Forwards, Sideways, With cane Number of Stairs: 15 General stair comments: Performed forward with single rail, lateral with single rail, and forward with single rail + quad cane. Pt feels confident with each technique. Supervision for safety, slow but stable as long as she is holding rail. No buckling. Teach back for recall, sequencing.   Wheelchair Mobility     Tilt Bed    Modified Rankin (Stroke Patients Only)       Balance Overall balance assessment: Mild deficits observed, not formally tested                                          Cognition Arousal: Alert Behavior During Therapy: WFL for tasks assessed/performed Overall Cognitive Status: Within Functional Limits for tasks assessed  Exercises      General Comments        Pertinent Vitals/Pain Pain Assessment Pain Assessment: Faces Faces Pain Scale: Hurts a little bit Pain Location: chronic pain R knee Pain Descriptors / Indicators: Guarding, Aching Pain Intervention(s): Monitored during session, Repositioned    Home Living                          Prior Function            PT Goals (current goals can now be found in the care plan  section) Acute Rehab PT Goals Patient Stated Goal: to get stronger so she can get back to walking up the stairs to cook for her family PT Goal Formulation: With patient Time For Goal Achievement: 10/05/22 Potential to Achieve Goals: Good Progress towards PT goals: Progressing toward goals    Frequency    Min 1X/week      PT Plan      Co-evaluation              AM-PAC PT "6 Clicks" Mobility   Outcome Measure  Help needed turning from your back to your side while in a flat bed without using bedrails?: None Help needed moving from lying on your back to sitting on the side of a flat bed without using bedrails?: None Help needed moving to and from a bed to a chair (including a wheelchair)?: A Little Help needed standing up from a chair using your arms (e.g., wheelchair or bedside chair)?: A Little Help needed to walk in hospital room?: A Little Help needed climbing 3-5 steps with a railing? : A Little 6 Click Score: 20    End of Session Equipment Utilized During Treatment: Gait belt;Oxygen Activity Tolerance: Patient tolerated treatment well Patient left: in bed;with call bell/phone within reach;with bed alarm set   PT Visit Diagnosis: Unsteadiness on feet (R26.81);Muscle weakness (generalized) (M62.81);Pain Pain - Right/Left: Right Pain - part of body: Knee     Time: 0936-1000 PT Time Calculation (min) (ACUTE ONLY): 24 min  Charges:    $Gait Training: 8-22 mins $Therapeutic Activity: 8-22 mins PT General Charges $$ ACUTE PT VISIT: 1 Visit                     Jodi Bell, PT, DPT [facility]  Rehabilitation Services Physical Therapist Office: [phone] Website: [email or web address]    Berton Mount 09/22/2022, 11:29 AM

## 2022-09-22 NOTE — Progress Notes (Addendum)
1432 Pt has left floor to HD with transport  1752 pt has returned to floor from HD

## 2022-09-22 NOTE — Progress Notes (Signed)
Chaplain attempted to complete [facility] consult for Advance Directives, but pt was out of the room in dialysis. Unit secretary notified chaplain that pt is to be discharged after. Documents left in pt's room on bedside table.   It pt would like to complete Advance Directives outpatient, she may contact [phone] or [email or web address] for further information, to make an appointment to complete documents in person, or have them notarized and uploaded into the [facility].  Documents are also available online at [email or web address]  [employer] Carley Hammed, M.Div. [facility] Chaplain Pager [phone] Office [phone]

## 2022-09-22 NOTE — Progress Notes (Signed)
MEWS Progress Note  Patient Details Name: Jodi Bell MRN: [id number] DOB: 11/30/1960 Today's Date: 09/22/2022   MEWS Flowsheet Documentation:  Assess: MEWS Score Temp: 98.4 F (36.9 C) BP: (!) 208/79 MAP (mmHg): 115 Pulse Rate: 68 ECG Heart Rate: 79 Resp: 18 Level of Consciousness: Alert SpO2: 100 % O2 Device: Nasal Cannula Patient Activity (if Appropriate): In bed O2 Flow Rate (L/min): 2 L/min FiO2 (%): 28 % Assess: MEWS Score MEWS Temp: 0 MEWS Systolic: 2 MEWS Pulse: 0 MEWS RR: 0 MEWS LOC: 0 MEWS Score: 2 MEWS Score Color: Yellow Assess: SIRS CRITERIA SIRS Temperature : 0 SIRS Respirations : 0 SIRS Pulse: 0 SIRS WBC: 0 SIRS Score Sum : 0 SIRS Temperature : 0 SIRS Pulse: 0 SIRS Respirations : 0 SIRS WBC: 0 SIRS Score Sum : 0 Assess: if the MEWS score is Yellow or Red Were vital signs accurate and taken at a resting state?: Yes Does the patient meet 2 or more of the SIRS criteria?: No MEWS guidelines implemented : Yes, yellow Treat MEWS Interventions: Considered administering scheduled or prn medications/treatments as ordered Take Vital Signs Increase Vital Sign Frequency : Yellow: Q2hr x1, continue Q4hrs until patient remains green for 12hrs Escalate MEWS: Escalate: Yellow: Discuss with charge nurse and consider notifying provider and/or RRT        Despina Hidden 09/22/2022, 1:32 AM

## 2022-09-23 LAB — HEPATITIS B SURFACE ANTIBODY, QUANTITATIVE: Hep B S AB Quant (Post): 13.1 m[IU]/mL

## 2022-09-23 LAB — GLUCOSE, CAPILLARY: Glucose-Capillary: 158 mg/dL — ABNORMAL HIGH (ref 70–99)

## 2022-09-23 MED ORDER — LOSARTAN POTASSIUM 50 MG PO TABS
50.0000 mg | ORAL_TABLET | Freq: Every day | ORAL | Status: DC
  Administered 2022-09-23: 50 mg via ORAL
  Filled 2022-09-23: qty 1

## 2022-09-23 NOTE — Progress Notes (Signed)
Occupational Therapy Treatment/Discharge Patient Details Name: Jodi Bell MRN: [id number] DOB: 1960-03-03 Today's Date: 09/23/2022   History of present illness Pt is a 62 y.o. female admitted on 09/19/22 for Confusion and abdominal pain.  Pt dx with acute metabolic encephalopathy likely due to hypoglycemia.  Pt with significant PMH of anemia of chronic disease, chronic back pain s/p lumbar fusion, COPD, DM, ESRD on HD, essential HTN, gout, PAD, aortic stenosis, mitral regurgitation, and L partial knee replacement.   OT comments  OT entering as bed exit alarming and pt received in bathroom. Pt able to manage remainder of toileting task, ADLs standing at sink and in-room mobility with Modified Independence including good O2 line mgmt. Discussed energy conservation, O2 sats monitoring and task modification with pt demo good awareness of self monitoring symptoms/needs at home. No further skilled OT services needed at acute level or on DC. Pt excited to start pulmonary rehab at DC.      If plan is discharge home, recommend the following:      Equipment Recommendations  None recommended by OT    Recommendations for Other Services      Precautions / Restrictions Precautions Precautions: Fall;Other (comment) Precaution Comments: monitor O2 sats with activity on 2-3 L O2 continous at baseline Restrictions Weight Bearing Restrictions: No       Mobility Bed Mobility               General bed mobility comments: in bathroom on entry    Transfers Overall transfer level: Modified independent Equipment used: None Transfers: Sit to/from Stand Sit to Stand: Modified independent (Device/Increase time)                 Balance Overall balance assessment: No apparent balance deficits (not formally assessed)                                         ADL either performed or assessed with clinical judgement   ADL Overall ADL's : Modified independent      Grooming: Modified independent;Oral care;Wash/dry hands;Standing                   Toilet Transfer: Modified Independent;Ambulation   Toileting- Clothing Manipulation and Hygiene: Modified independent         General ADL Comments: OT entering as bed exit alarm going off - pt had entering bathroom without assist, able to manage toileting task and O2 line without issue and ADLs at sink. Discussed energy conservation, O2 monitoring with pulse ox at home with pt demo good insight into safe O2 levels, hopeful to wean off of O2 w/ pulmonary rehab at DC    [facility] Assessment Upper Extremity Assessment Upper Extremity Assessment: Overall WFL for tasks assessed   Lower Extremity Assessment Lower Extremity Assessment: Defer to PT evaluation        Vision   Vision Assessment?: No apparent visual deficits   Perception     Praxis      Cognition Arousal: Alert Behavior During Therapy: WFL for tasks assessed/performed Overall Cognitive Status: Within Functional Limits for tasks assessed                                          Exercises      Shoulder Instructions  General Comments SpO2 97% after ADLs on supplemental O2    Pertinent Vitals/ Pain       Pain Assessment Pain Assessment: No/denies pain  Home Living                                          Prior Functioning/Environment              Frequency  Min 1X/week        Progress Toward Goals  OT Goals(current goals can now be found in the care plan section)  Progress towards OT goals: Goals met/education completed, patient discharged from OT  Acute Rehab OT Goals Patient Stated Goal: home today OT Goal Formulation: With patient Time For Goal Achievement: 10/05/22 Potential to Achieve Goals: Fair  Plan      Co-evaluation                 AM-PAC OT "6 Clicks" Daily Activity     Outcome Measure   Help from another person eating meals?:  None Help from another person taking care of personal grooming?: None Help from another person toileting, which includes using toliet, bedpan, or urinal?: None Help from another person bathing (including washing, rinsing, drying)?: None Help from another person to put on and taking off regular upper body clothing?: None Help from another person to put on and taking off regular lower body clothing?: None 6 Click Score: 24    End of Session    OT Visit Diagnosis: Unsteadiness on feet (R26.81);Pain   Activity Tolerance Patient tolerated treatment well   Patient Left with call bell/phone within reach;in bed;with bed alarm set   Nurse Communication Mobility status        Time: 8295-6213 OT Time Calculation (min): 10 min  Charges: OT General Charges $OT Visit: 1 Visit OT Treatments $Self Care/Home Management : 8-22 mins  [name], OTR/L Acute Rehab Services Office: [phone]   [name] 09/23/2022, 8:33 AM

## 2022-09-23 NOTE — Progress Notes (Signed)
Discharge instructions given. Patient verbalized understanding and all questions were answered.  ?

## 2022-09-23 NOTE — Progress Notes (Signed)
Patient seen and examined this morning Alert awake oriented x 3 no hypoglycemia Discharge was held yesterday due to uncontrolled hypertension and also came lat from dialysis This morning no headache no chest pain no nausea vomiting and asymptomatic She feels ready for discharge. Discussed with Dr. Valentino Nose from nephrology-reviewed her blood pressure and vitals> after her a.m. medication BP has improved in 172/59> and okay for discharge home she does have Resistent hypertension on clonidine hydralazine Coreg Imdur Procardia and ARB-olmesartan-and had extra dialysis 8/12 and will continue on scheduled dialysis as OP.

## 2022-09-23 NOTE — Progress Notes (Signed)
Pts BP is now 214/70 HR-64.  Dr. Arlean Hopping notified for clarification of procardia and further guidance.

## 2022-09-23 NOTE — Care Management Important Message (Signed)
Important Message  Patient Details  Name: Jodi Bell MRN: [id number] Date of Birth: 18-Jan-1961   Medicare Important Message Given:  Yes       09/23/2022, 2:24 PM

## 2022-09-23 NOTE — Progress Notes (Signed)
[facility] Progress Note   Subjective: Tolerated diet this with no issues yesterday.  Feeling fairly well.  Blood pressure slightly improved.  Getting the patient back on all her medications.  Objective Vitals:   09/23/22 0320 09/23/22 0412 09/23/22 0744 09/23/22 0821  BP: (!) 184/76 (!) 172/84 (!) 173/66   Pulse: 65  65   Resp: 18  13   Temp: 98.3 F (36.8 C)  98.4 F (36.9 C)   TempSrc: Oral  Oral   SpO2: 100%  100% 99%  Weight:      Height:       Physical Exam General: Alert female in NAD Heart: normal rate, no rub Lungs: bl chest rise, no iwob Abdomen: Soft, +BS Extremities: No edema b/l lower extremities, warm and well perfused Dialysis Access: LUE AVF  Additional Objective Labs: Basic Metabolic Panel: Recent Labs  Lab 09/22/22 0040 09/22/22 1336 09/23/22 0407  NA 132* 129* 129*  K 4.1 4.7 4.0  CL 94* 93* 93*  CO2 27 23 26   GLUCOSE 140* 115* 136*  BUN 21 24* 20  CREATININE 4.99* 5.56* 4.53*  CALCIUM 8.9 9.0 8.1*  PHOS  --  3.8  --    Liver Function Tests: Recent Labs  Lab 09/19/22 0916 09/20/22 0749 09/22/22 1336  AST 16 16  --   ALT 5 7  --   ALKPHOS 130* 108  --   BILITOT 0.4 0.4  --   PROT 6.9 6.4*  --   ALBUMIN 4.0 3.2* 3.1*   Recent Labs  Lab 09/19/22 0916  LIPASE 28   CBC: Recent Labs  Lab 09/20/22 0906 09/21/22 0252 09/22/22 0040 09/22/22 1336 09/23/22 0407  WBC 3.7* 2.0* 1.6* 3.1* 2.3*  NEUTROABS 1.8  --   --   --   --   HGB 11.9* 10.4* 10.0* 10.7* 9.7*  HCT 39.7 35.0* 33.3* 35.2* 32.0*  MCV 108.8* 106.4* 103.4* 104.5* 103.9*  PLT 114* 108* 111* 122* 111*   Blood Culture    Component Value Date/Time   SDES BLOOD SITE NOT SPECIFIED 06/26/2021 0650   SPECREQUEST  06/26/2021 0650    BOTTLES DRAWN AEROBIC AND ANAEROBIC Blood Culture results may not be optimal due to an excessive volume of blood received in culture bottles   CULT  06/26/2021 0650    NO GROWTH 5 DAYS Performed at [facility],  [address] [place], [place] [address]    REPTSTATUS 07/01/2021 FINAL 06/26/2021 0650    Cardiac Enzymes: No results for input(s): "CKTOTAL", "CKMB", "CKMBINDEX", "TROPONINI" in the last 168 hours. CBG: Recent Labs  Lab 09/22/22 0503 09/22/22 0941 09/22/22 1210 09/22/22 2023 09/23/22 0319  GLUCAP 100* 140* 111* 165* 158*   Iron Studies: No results for input(s): "IRON", "TIBC", "TRANSFERRIN", "FERRITIN" in the last 72 hours. @lablastinr3 @ Studies/Results: No results found. Medications:   allopurinol  100 mg Oral QPM   arformoterol  15 mcg Nebulization BID   And   umeclidinium bromide  1 puff Inhalation Daily   budesonide (PULMICORT) nebulizer solution  0.25 mg Nebulization BID   carvedilol  25 mg Oral BID WC   Chlorhexidine Gluconate Cloth  6 each Topical Q0600   cinacalcet  60 mg Oral Q M,W,F-HD   cloNIDine  0.3 mg Transdermal Weekly   heparin  5,000 Units Subcutaneous Q8H   hydrALAZINE  100 mg Oral TID   insulin aspart  0-15 Units Subcutaneous Q4H   isosorbide mononitrate  60 mg Oral Daily   losartan  50 mg Oral Daily   montelukast  10 mg Oral Q supper   multivitamin  1 tablet Oral Q M,W,F-2000   NIFEdipine  60 mg Oral BID   pantoprazole  40 mg Oral BID   predniSONE  20 mg Oral Q breakfast    OP Dialysis Orders: [facility] MWF [address]  3.5h   [facility]erm[facility] Barters  2K/3Ca bath  Hep none  L AVF - zemplar 8.5 mcg IV - sensipar 60mg  tiw  Assessment/Plan: AMS - hypoglycemia and sedating meds. improved Pulm edema - seen on CXR and exam. Had extra HD Saturday. Continue to push UF as tolerated. Likely needs new EDW.  Outpatient unit may need to consider extra dialysis treatments ESRD - on HD MWF. HTN - mainly needs to optimize volume. On clonidine 0.3mg  weekly, hydral 100mg  tid, coreg 25mg  BID, imdure 60mg  daily, nifedipine 60mg  daily.  Plan to start losartan today but patient takes olmesartan at home.  Would continue.  Consider extra dialysis outpatient as above. PAF- on  carvedilol and nifedipine Chronic resp failure- on home O2 at baseline Anemia- Hgb 9.7, hold on initiation of ESA for now Secondary hyperparathyroidism; Ca controlled, phos at goal

## 2022-10-25 ENCOUNTER — Encounter: Payer: Self-pay | Admitting: Cardiology

## 2022-10-27 MED ORDER — ISOSORBIDE MONONITRATE ER 60 MG PO TB24: 60.0000 mg | ORAL_TABLET | Freq: Every day | ORAL | 1 refills | Status: DC

## 2022-11-03 ENCOUNTER — Other Ambulatory Visit: Payer: Self-pay | Admitting: Physician Assistant

## 2022-11-17 ENCOUNTER — Telehealth: Payer: Self-pay

## 2022-11-17 NOTE — Telephone Encounter (Signed)
The patient called into the office to request an appointment with Dr Lynnette Caffey.  The patient is experiencing worsening SOB.  The pt states that her most recent blood count was in the 11-12 range. The patient has been scheduled for evaluation on 10/16.

## 2022-11-20 ENCOUNTER — Ambulatory Visit: Payer: Medicare PPO | Admitting: Pulmonary Disease

## 2022-11-20 NOTE — Progress Notes (Addendum)
Patient ID: Jodi Bell MRN: [id number] DOB/AGE: 10-01-1960 62 y.o.  Primary Care Physician:Jobe, [name]., MD Primary Cardiologist: Donato Schultz, MD  FOCUSED CARDIOVASCULAR PROBLEM LIST:   1.  Aortic stenosis with an aortic valve area of 0.86 cm2, peak velocity of 4.1 m/s, and mean gradient 36 mmHg and ejection fraction is 65 to 70% on [facility] echocardiogram May 2023; EKG demonstrates sinus rhythm without bundle-branch blocks TAVR CTA June 2024 with dense aortic valve annular calcification 2.  Coronary artery disease status post CABG consisting of a LIMA to LAD and vein graft to PDA 2022 3.  End-stage renal disease on hemodialysis 4.  Chronic hypoxia on chronic supplemental oxygen High res CT 2023 negative for ILD 5.  Type 2 diabetes mellitus, diet-controlled 6.  Hypertension  HISTORY OF PRESENT ILLNESS:  May 2024: The patient is a 62 y.o. female with the indicated medical history here for recommendations regarding the patient's severe aortic stenosis.  The patient is here with her daughter.  She tells me over the last few months she has become increasingly more short of breath.  She contracted COVID in early 2023 and since that time she seems to have developed an oxygen requirement.  She has been evaluated by pulmonology and despite rather long smoking history her PFTs demonstrate no evidence of COPD but was concerning for interstitial lung disease.  However high-resolution chest CT in 2023 was negative for ILD.  She tells me she has been increasingly short of breath with rather minimal exertion.  Supplemental oxygen seems to help the symptoms but does not relieve them entirely.  She is not short of breath at rest.  Prior to contracting COVID she was quite active and able to do all of her activities of daily living without any issues.  She has been on dialysis for about 5 or 6 years without any limitations.  Notably she has had no issues with shortened dialysis or low blood pressures  during dialysis.  She has had no infectious issues recently as well.  She denies chest pain, presyncope, or syncope.  She does report orthopnea.  She has some mild right lower extremity swelling which is limited and seems to have occurred after her vein harvest for her CABG procedure.  She fortunately has not required any recent emergency room visits or hospitalizations.  She has a few remaining teeth left and is planning to make an appointment to have these extracted so that she can be fitted for full dentures.  July 2024: The patient underwent cardiac catheterization which demonstrated patent LIMA and vein graft to PDA.  A TAVR protocol CTA demonstrated dense annular calcification of about half of the perimeter.  Opinion was that anatomy was at risk for PVL or annular rupture after TAVR procedure.  She is here to discuss those findings.  The patient tells me she actually feels better.  Her hemoglobin has risen which is corresponded with this improvement.  She is able to walk around her house without much difficulty.  Her dialysis sessions have not been interrupted due to low blood pressures.  She denies any presyncope or syncope.  She denies any chest pressure or anginal symptoms.  She is relatively sedentary but able to do most of her activities of daily living without any issues.  Plan: Monitor for worsening symptoms or inability to tolerate dialysis.  October 2024: The patient is here with her daughter.  She has developed lifestyle limiting shortness of breath with NYHA class II symptoms.  She  used to not have to use oxygen on a continuous basis and that unfortunately has developed.  She noticed this a few weeks after I last met her.  She fortunately has not had any presyncope or syncope.  She does have chronic orthopnea which is unchanged.  She has been able to tolerate full dialysis sessions without hypotension or need to discontinue dialysis for any reason.  She is quite unhappy with her quality of  life at this point in time.    Past Medical History:  Diagnosis Date   Anemia of chronic disease    Anxiety    Arthritis    oa and psoriatic ra   Asthma    Back pain, chronic    lower back   Chronic insomnia    COPD (chronic obstructive pulmonary disease) (HCC)    Diabetes mellitus    type 2   ESRD on dialysis [facility])    Essential hypertension    GERD (gastroesophageal reflux disease)    Gout    History of hiatal hernia    Hypoalbuminemia    Hyponatremia    Mild aortic stenosis    Mitral regurgitation    Mitral stenosis    Obesity    PAD (peripheral artery disease) (HCC)    a. externial iliac calcification seen on CT 04/2020.   Sleep apnea    Tobacco abuse    Upper GI bleed 05/2019    Past Surgical History:  Procedure Laterality Date   BACK SURGERY  2016   lower back fusion with cage   BIOPSY  09/23/2017   Procedure: BIOPSY;  Surgeon: Graylin Shiver, MD;  Location: [facility] ENDOSCOPY;  Service: Endoscopy;;   BIOPSY  05/19/2019   Procedure: BIOPSY;  Surgeon: Kathi Der, MD;  Location: [facility] ENDOSCOPY;  Service: Gastroenterology;;   CESAREAN SECTION  1990   x 1    COLONOSCOPY WITH PROPOFOL N/A 09/23/2017   Procedure: COLONOSCOPY WITH PROPOFOL Hemostatic clips placed;  Surgeon: Graylin Shiver, MD;  Location: [facility] ENDOSCOPY;  Service: Endoscopy;  Laterality: N/A;   COLONOSCOPY WITH PROPOFOL N/A 10/02/2020   Procedure: COLONOSCOPY WITH PROPOFOL;  Surgeon: Willis Modena, MD;  Location: [facility] ENDOSCOPY;  Service: Endoscopy;  Laterality: N/A;   CORONARY ARTERY BYPASS GRAFT N/A 09/13/2020   Procedure: CORONARY ARTERY BYPASS GRAFTING (CABG), ON PUMP, TIMES TWO, USING LEFT INTERNAL MAMMARY ARTERY AND RIGHT ENDOSCOPICALLY HARVESTED GREATER SAPHENOUS VEIN;  Surgeon: Alleen Borne, MD;  Location: [facility] OR;  Service: Open Heart Surgery;  Laterality: N/A;   DILATION AND CURETTAGE OF UTERUS  1988   ENTEROSCOPY N/A 07/22/2021   Procedure: ENTEROSCOPY;  Surgeon: Kerin Salen, MD;  Location: [facility]  ENDOSCOPY;  Service: Gastroenterology;  Laterality: N/A;   ESOPHAGOGASTRODUODENOSCOPY N/A 09/22/2020   Procedure: ESOPHAGOGASTRODUODENOSCOPY (EGD);  Surgeon: Willis Modena, MD;  Location: [facility] ENDOSCOPY;  Service: Endoscopy;  Laterality: N/A;   ESOPHAGOGASTRODUODENOSCOPY (EGD) WITH PROPOFOL N/A 09/23/2017   Procedure: ESOPHAGOGASTRODUODENOSCOPY (EGD) WITH PROPOFOL;  Surgeon: Graylin Shiver, MD;  Location[facility]OS[facility];  Service: Endoscopy;  Laterality: N/A;   ESOPHAGOGASTRODUODENOSCOPY (EGD) WITH PROPOFOL N/A 05/19/2019   Procedure: ESOPHAGOGASTRODUODENOSCOPY (EGD) WITH PROPOFOL;  Surgeon: Kathi Der, MD;  [facility];  Service: Gastroenterology;  Laterality: N/A;   ESOPHAGOGASTRODUODENOSCOPY (EGD) WITH PROPOFOL N/A 10/02/2020   Procedure: ESOPHAGOGASTRODUODENOSCOPY (EGD) WITH PROPOFOL;  Surgeon: Willis Modena, MD;  Location: [facility] ENDOSCOPY;  Service: Endoscopy;  Laterality: N/A;   GIVENS CAPSULE STUDY N/A 05/19/2019   Procedure: GIVENS CAPSULE STUDY;  Surgeon: Kathi Der, MD;  Location: [facility]  ENDOSCOPY;  Service: Gastroenterology;  Laterality: N/A;   GIVENS CAPSULE STUDY N/A 07/19/2021   Procedure: GIVENS CAPSULE STUDY;  Surgeon: [name], MD;  Location: [facility] ENDOSCOPY;  Service: Gastroenterology;  Laterality: N/A;   HEMOSTASIS CLIP PLACEMENT  09/22/2020   Procedure: HEMOSTASIS CLIP PLACEMENT;  Surgeon: Willis Modena, MD;  Location[facility] ENDOSCOPY;  Service: Endoscopy;;   HEMOSTASIS CONTROL  09/22/2020   Procedure: HEMOSTASIS CONTROL;  Surgeon: Willis Modena, MD;  Location: [facility] ENDOSCOPY;  Service: Endoscopy;;   HOT HEMOSTASIS N/A 05/19/2019   Procedure: HOT HEMOSTASIS (ARGON PLASMA COAGULATION/BICAP);  Surgeon: Kathi Der, MD;  Location: [facility] ENDOSCOPY;  Service: Gastroenterology;  Laterality: N/A;   LEFT HEART CATH AND CORONARY ANGIOGRAPHY N/A 09/10/2020   Procedure: LEFT HEART CATH AND CORONARY ANGIOGRAPHY;  Surgeon: Yvonne Kendall, MD;  [facility]: [facility] INVASIVE CV LAB;  Service:  Cardiovascular;  Laterality: N/A;   PARTIAL KNEE ARTHROPLASTY Left 11/12/2017   Procedure: left unicompartmental arthroplasty-medial;  Surgeon: Durene Romans, MD;  Location: [facility] ORS;  Service: Orthopedics;  Laterality: Left;    RIGHT HEART CATH AND CORONARY/GRAFT ANGIOGRAPHY N/A 06/26/2022   Procedure: RIGHT HEART CATH AND CORONARY/GRAFT ANGIOGRAPHY;  Surgeon: [name], MD;  [facility] INVASIVE CV LAB;  Service: Cardiovascular;  Laterality: N/A;   SUBMUCOSAL INJECTION  09/23/2017   Procedure: SUBMUCOSAL INJECTION;  Surgeon: Graylin Shiver, MD;  Location: [facility] ENDOSCOPY;  Service: Endoscopy;;  in colon   TEE WITHOUT CARDIOVERSION N/A 09/13/2020   Procedure: TRANSESOPHAGEAL ECHOCARDIOGRAM (TEE);  Surgeon: Alleen Borne, MD;  Location: [facility] OR;  Service: Open Heart Surgery;  Laterality: N/A;   TUBAL LIGATION  1990    Family History  Problem Relation Age of Onset   Lung cancer Mother    Diabetes Sister    Heart disease Sister    Asthma Daughter    Arthritis Daughter    Arthritis Daughter    Thyroid cancer Other        1/2 sister   Heart disease Other        1/2 sister    Social History   Socioeconomic History   Marital status: Divorced    Spouse name: BASIL   Number of children: 3   Years of education: 1   Highest education level: GED or equivalent  Occupational History   Occupation: retired Public house manager  Tobacco Use   Smoking status: Former    Current packs/day: 0.00    Average packs/day: 0.5 packs/day for 40.0 years (20.0 ttl pk-yrs)    Types: Cigarettes    Start date: 08/1980    Quit date: 08/2020    Years since quitting: 2.2   Smokeless tobacco: Never  Vaping Use   Vaping status: Never Used  Substance and Sexual Activity   Alcohol use: Yes    Comment: occ   Drug use: No   Sexual activity: Not on file  Other Topics Concern   Not on file  Social History Narrative   Not on file   Social Determinants of Health   Financial Resource Strain: Low Risk  (06/12/2022)    Received [facility], [facility]   Overall Financial Resource Strain (CARDIA)    Difficulty of Paying Living Expenses: Not hard at all  Recent Concern: Financial Resource Strain - Medium Risk (04/29/2022)   Received from [facility]   Overall Financial Resource Strain (CARDIA)    Difficulty of Paying Living Expenses: Somewhat hard  Food Insecurity: No Food Insecurity (09/20/2022)   Hunger Vital Sign  Worried About Programme researcher, broadcasting/film/video in the Last Year: Never true    Ran Out of Food in the Last Year: Never true  Transportation Needs: No Transportation Needs (09/20/2022)   PRAPARE - Administrator, Civil Service (Medical): No    Lack of Transportation (Non-Medical): No  Physical Activity: Inactive (09/15/2018)   Exercise Vital Sign    Days of Exercise per Week: 0 days    Minutes of Exercise per Session: 0 min  Stress: Stress Concern Present (07/02/2022)   Received from [facility], [facility] - Occupational Stress Questionnaire    Feeling of Stress : To some extent  Social Connections: Unknown (06/23/2021)   Received from [facility], [facility]   Social Network    Social Network: Not on file  Intimate Partner Violence: Not At Risk (11/18/2022)   Received from [facility]   HITS    Over the last 12 months how often did your partner physically hurt you?: 1    Over the last 12 months how often did your partner insult you or talk down to you?: 1    Over the last 12 months how often did your partner threaten you with physical harm?: 1    Over the last 12 months how often did your partner scream or curse at you?: 1     Prior to Admission medications   Medication Sig Start Date End Date Taking? Authorizing Provider  acetaminophen (TYLENOL) 500 MG tablet Take 1,000 mg by mouth daily as needed (pain).    [provider]  albuterol (PROVENTIL) (2.5 MG/3ML) 0.083% nebulizer solution Take 3 mLs (2.5 mg total) by  nebulization every 6 (six) hours as needed for wheezing or shortness of breath. 07/10/21   Hunsucker, Lesia Sago, MD  allopurinol (ZYLOPRIM) 100 MG tablet Take 100 mg by mouth every evening.    [provider]  carboxymethylcellulose 1 % ophthalmic solution Place 2 drops into both eyes 2 (two) times daily as needed (dry eyes).    [provider]  carvedilol (COREG) 25 MG tablet Take 25 mg by mouth 2 (two) times daily with a meal.    [provider]  cetirizine (ZYRTEC) 10 MG tablet Take 10 mg by mouth daily as needed for allergies.    [provider]  cholecalciferol (VITAMIN D) 25 MCG tablet Take 1 tablet (1,000 Units total) by mouth every evening. Patient taking differently: Take 1,000 Units by mouth See admin instructions. Take one tablet by mouth on Monday Wednesdays and Fridays 10/04/20   Gershon Crane E, PA-C  cholecalciferol (VITAMIN D3) 10 MCG (400 UNIT) TABS tablet Take 800 Units by mouth daily.    [provider]  cinacalcet (SENSIPAR) 60 MG tablet Take 60 mg by mouth every evening.    [provider]  cloNIDine (CATAPRES - DOSED IN MG/24 HR) 0.3 mg/24hr patch 0.3 mg once a week.    [provider]  cyclobenzaprine (FLEXERIL) 10 MG tablet Take 10 mg by mouth 2 (two) times daily as needed for muscle spasms.    [provider]  Darbepoetin Alfa (ARANESP) 200 MCG/0.4ML SOSY injection Inject 0.4 mLs (200 mcg total) into the vein every Wednesday with hemodialysis. 10/10/20   Gershon Crane E, PA-C  diclofenac Sodium (VOLTAREN) 1 % GEL Apply 2 g topically daily as needed (pain).    [provider]  docusate sodium (COLACE) 100 MG capsule Take 100 mg by mouth daily as  needed for mild constipation.    [provider]  EPINEPHrine 0.3 mg/0.3 mL IJ SOAJ injection Inject into the muscle as needed for anaphylaxis. 07/12/21   [provider]  esomeprazole (NEXIUM) 20 MG capsule Take by mouth as needed (heartburn).  05/21/19   [provider]  ferrous sulfate 325 (65 FE) MG tablet Take 325 mg by mouth See admin instructions. Take 1 tablet by mouth three times a week on Tues, Thur, Sat only per patient    [provider]  folic acid (FOLVITE) 400 MCG tablet Take 400 mcg by mouth daily.    [provider]  heparin 1000 unit/mL SOLN injection 1 mL (1,000 Units total) by Dialysis route as needed (in dialysis). 10/04/20   Gershon Crane E, PA-C  hydrALAZINE (APRESOLINE) 100 MG tablet Take 1 tablet (100 mg total) by mouth every 8 (eight) hours. Patient taking differently: Take 100 mg by mouth 3 (three) times daily. 07/06/20   Cresenzo, Cyndi Lennert, MD  hydrocortisone (ANUSOL-HC) 2.5 % rectal cream Place rectally 2 (two) times daily. Patient taking differently: Place 1 Application rectally daily as needed for hemorrhoids or anal itching. 07/23/21   Park Pope, MD  isosorbide mononitrate (IMDUR) 60 MG 24 hr tablet Take 1 tablet (60 mg total) by mouth daily. 08/14/21   Bhagat, Bhavinkumar, PA  LORazepam (ATIVAN) 1 MG tablet Take 1 mg by mouth every 8 (eight) hours as needed for anxiety. 06/25/20   [provider]  montelukast (SINGULAIR) 10 MG tablet Take 10 mg by mouth daily with supper.     [provider]  NIFEdipine (PROCARDIA XL/NIFEDICAL XL) 60 MG 24 hr tablet Take 60 mg by mouth 2 (two) times daily. 10/01/21   [provider]  olmesartan (BENICAR) 40 MG tablet Take 40 mg by mouth every evening. 05/06/19   [provider]  ondansetron (ZOFRAN) 4 MG tablet Take 4 mg by mouth 2 (two) times daily as needed for nausea or vomiting.    [provider]  OVER THE COUNTER MEDICATION Take by mouth daily as needed (sickness). Elderberry Fruit and flower 1 dropper full by mouth daily as needed for sickness    [provider]  Oxycodone HCl 10 MG TABS Take 10 mg by mouth daily as needed for pain. 01/11/21   [provider]  OXYGEN Inhale 2 L into the lungs  as directed.    [provider]  pantoprazole (PROTONIX) 40 MG tablet Take 40 mg by mouth daily. 06/09/20   [provider]  predniSONE (DELTASONE) 5 MG tablet Take 1 tablet (5 mg total) by mouth daily with breakfast. 02/27/22   Marrianne Mood, MD  Probiotic Product (PROBIOTIC PO) Take 1 capsule by mouth daily.    [provider]  sorbitol 70 % SOLN Take 30 ml 1-2 times daily as needed for constipation 07/23/21   Park Pope, MD  Tiotropium Bromide-Olodaterol (STIOLTO RESPIMAT) 2.5-2.5 MCG/ACT AERS Inhale 2 puffs once daily into the lungs. 11/28/21   Hunsucker, Lesia Sago, MD  VENTOLIN HFA 108 (90 Base) MCG/ACT inhaler Inhale 2 puffs into the lungs See admin instructions. Take 2 puffs every 4-6 hours as needed for wheezing and shortness of breath 04/08/22   Hunsucker, Lesia Sago, MD  zolpidem (AMBIEN) 5 MG tablet Take 5 mg by mouth at bedtime as needed for sleep. 02/04/21   [provider]    Allergies  Allergen Reactions   3-Methyl-2-Benzothiazolinone Hydrazone Other (See Comments)    Muscle weakness muscle cramping  in legs Other reaction(s): Myalgias (Muscle Pain)   Banana Anaphylaxis, Swelling and Other (See Comments)    TONGUE AND MOUTH SWELLING   Black Walnut Flavor Anaphylaxis and Itching    Walnuts   Hazelnut (Filbert) Anaphylaxis    Hazelnuts   Leflunomide And Related Other (See Comments)    Severe Headache   Lisinopril Swelling    Angioedema  Swelling of the face   No Healthtouch Food Allergies Anaphylaxis    Spicy Mustard 4.7.2021 Pt reports that she eats Regular Yellow Mustard Swelling and itching of tongue   Other Anaphylaxis, Swelling and Other (See Comments)    Cosentyx= angioedema Mouth itches and tongue swelling Hazel nuts and pecans also Walnuts Renal failure Serotonin syndrome  Tremors Muscle weakness muscle cramping in legs Other reaction(s): Myalgias (Muscle Pain)   Pecan Nut (Diagnostic) Anaphylaxis and Itching    Pecans    Trazodone And Nefazodone Other (See Comments)    Serotonin Syndrome  Tremors   Adalimumab Other (See Comments)    Blisters on abdomen =humira   Escitalopram Oxalate Other (See Comments)    Hand problems - Serotonin Syndrome     Ezetimibe Other (See Comments)    myalgias cramps    Secukinumab Swelling    Cosentyx = angiodema    Statins Other (See Comments)    Leg pains and weakness   Ferrlecit [Na Ferric Gluc Cplx In Sucrose]     Not reaction given from HD   Gabapentin Other (See Comments)    tremors   Nsaids     Avoid due to renal problems     REVIEW OF SYSTEMS:  General: no fevers/chills/night sweats Eyes: no blurry vision, diplopia, or amaurosis ENT: no sore throat or hearing loss Resp: no cough, wheezing, or hemoptysis CV: no edema or palpitations GI: no abdominal pain, nausea, vomiting, diarrhea, or constipation GU: no dysuria, frequency, or hematuria Skin: no rash Neuro: no headache, numbness, tingling, or weakness of extremities Musculoskeletal: no joint pain or swelling Heme: no bleeding, DVT, or easy bruising Endo: no polydipsia or polyuria  BP (!) 142/54   Pulse 60   Ht 5\' 1"  (1.549 m)   Wt 133 lb 6.4 oz (60.5 kg)   SpO2 91%   BMI 25.21 kg/m   PHYSICAL EXAM: GEN:  AO x 3 in no acute distress HEENT: normal Dentition: Poor Neck: JVP normal. +2 carotid upstrokes without bruits. No thyromegaly. Lungs: equal expansion, clear bilaterally CV: Apex is discrete and nondisplaced, RRR with 3/6 SEM Abd: soft, non-tender, non-distended; no bruit; positive bowel sounds Ext: no edema, ecchymoses, or cyanosis Vascular: 2+ femoral pulses, 2+ radial pulses       Skin: warm and dry without rash Neuro: CN II-XII grossly intact; motor and sensory grossly intact    DATA AND STUDIES:  EKG:  Sinus rhythm with left ventricular hypertrophy  2D ECHO: [facility] 07/2021  1. Left ventricular ejection fraction, by estimation, is 65 to 70%. The  left ventricle has normal  function. The left ventricle has no regional  wall motion abnormalities. There is severe concentric left ventricular  hypertrophy. Left ventricular diastolic   parameters are consistent with Grade II diastolic dysfunction  (pseudonormalization). Elevated left atrial pressure.   2. Right ventricular systolic function is mildly reduced. The right  ventricular size is normal. There is moderately elevated pulmonary artery  systolic pressure. The estimated right ventricular systolic pressure is  55.2 mmHg which is higher than previous   TTE on 06/26/21 at . May be  related to volume overload, however, PE  is on the differential. Recommend clinical correlation.   3. Left atrial size was severely dilated.   4. Right atrial size was mildly dilated.   5. The mitral valve is degenerative. Mild mitral valve regurgitation.  Severe mitral annular calcification.   6. Tricuspid valve regurgitation is mild to moderate.   7. The aortic valve is tricuspid. There is moderate calcification of the  aortic valve. There is moderate thickening of the aortic valve. Aortic  valve regurgitation is not visualized. Moderate aortic valve stenosis.  Aortic valve mean gradient measures  36.0 mmHg. Aortic valve Vmax measures 4.11 m/s. AVA by continuity 1.4cm2,  DI 0.74m/s. Suspect elevated mean gradient and Vmax in the setting of high  output with LVOT VTI 40.4cm.   8. Aortic dilatation noted. There is borderline dilatation of the  ascending aorta, measuring 39 mm.   9. The inferior vena cava is dilated in size with <50% respiratory  variability, suggesting right atrial pressure of 15 mmHg.   CARDIAC CATH: n/a  STS RISK CALCULATOR: pending  NHYA CLASS: 2  EDWARDS PROCTOR REVIEW 2024:  Fair quality scan.  There is a large burden of annular and subannular calcium along the left and right cusps.  My measurements of the annulus are slightly smaller than the workup with the annulus being sq and tapering into  the LVOT.  I think a 23mm valve will give a good seal despite the calcium and I would not suspect much perivalvular leak.  There is an elevated risk for annular rupture, but I would not call it prohibitive.  This risk must be taken into consideration for the case to help decide surgery vs TAVR given the age.  Also, the other concern is the calcium may prevent full expansion of the valve leading to a higher gradient.    ASSESSMENT AND PLAN:   Severe aortic stenosis - Plan: ECHOCARDIOGRAM COMPLETE  Type 2 diabetes mellitus with complication, without long-term current use of insulin (HCC) - Plan: ECHOCARDIOGRAM COMPLETE  Hypertension associated with diabetes (HCC) - Plan: ECHOCARDIOGRAM COMPLETE  Hyperlipidemia associated with type 2 diabetes mellitus (HCC) - Plan: ECHOCARDIOGRAM COMPLETE  ESRD (end stage renal disease) (HCC) - Plan: ECHOCARDIOGRAM COMPLETE  S/P CABG x 2 - Plan: ECHOCARDIOGRAM COMPLETE  The patient has developed worsening dyspnea.  This likely due to her worsening aortic stenosis.  I will obtain an echocardiogram to evaluate further.  I reviewed her TAVR protocol CTA which demonstrated significant annular calcification which was thought to be at risk for annular rupture versus paravalvular leak.  I reviewed the proctor review from [place] (see above) which suggested a 23 mm SAPIEN 3 valve could obtain a good seal and the risk of annular rupture was not prohibitive.  I do not think she will be judged to be a good surgical candidate given the fact she has already had a sternotomy, she is frail, on oxygen, and has end-stage renal disease.  I think her only viable option to address her aortic stenosis is with a TAVR procedure.  I did speak to her about the risks of paravalvular leak and annular rupture.  Annular rupture would not be survivable.  If there is significant paravalvular leak this would have to be treated medically or potentially with a plug depending on how severe it is.   Regardless I think something will need to be done to address her symptoms.  She is a young woman and unfortunately has many important comorbidities  that will need to be weighed heavily. The patient and her daughter will discuss with more family about her desires for intervention.  I think if she and her family would like an intervention then a 23 mm SAPIEN 3 valve with slightly less volume is our best option.  [name], MD  11/26/2022 9:57 AM    [facility] [address] [place], [place], [place]  [address] Phone: [phone]; Fax: [phone]

## 2022-11-21 IMAGING — DX DG CHEST 1V PORT
2 series · 2 of 2 positions shown · non-contrast
Comparison: July 03, 2020.

CLINICAL DATA: Shortness of breath

EXAM:
PORTABLE CHEST 1 VIEW

[chest ap (1 of 2)]
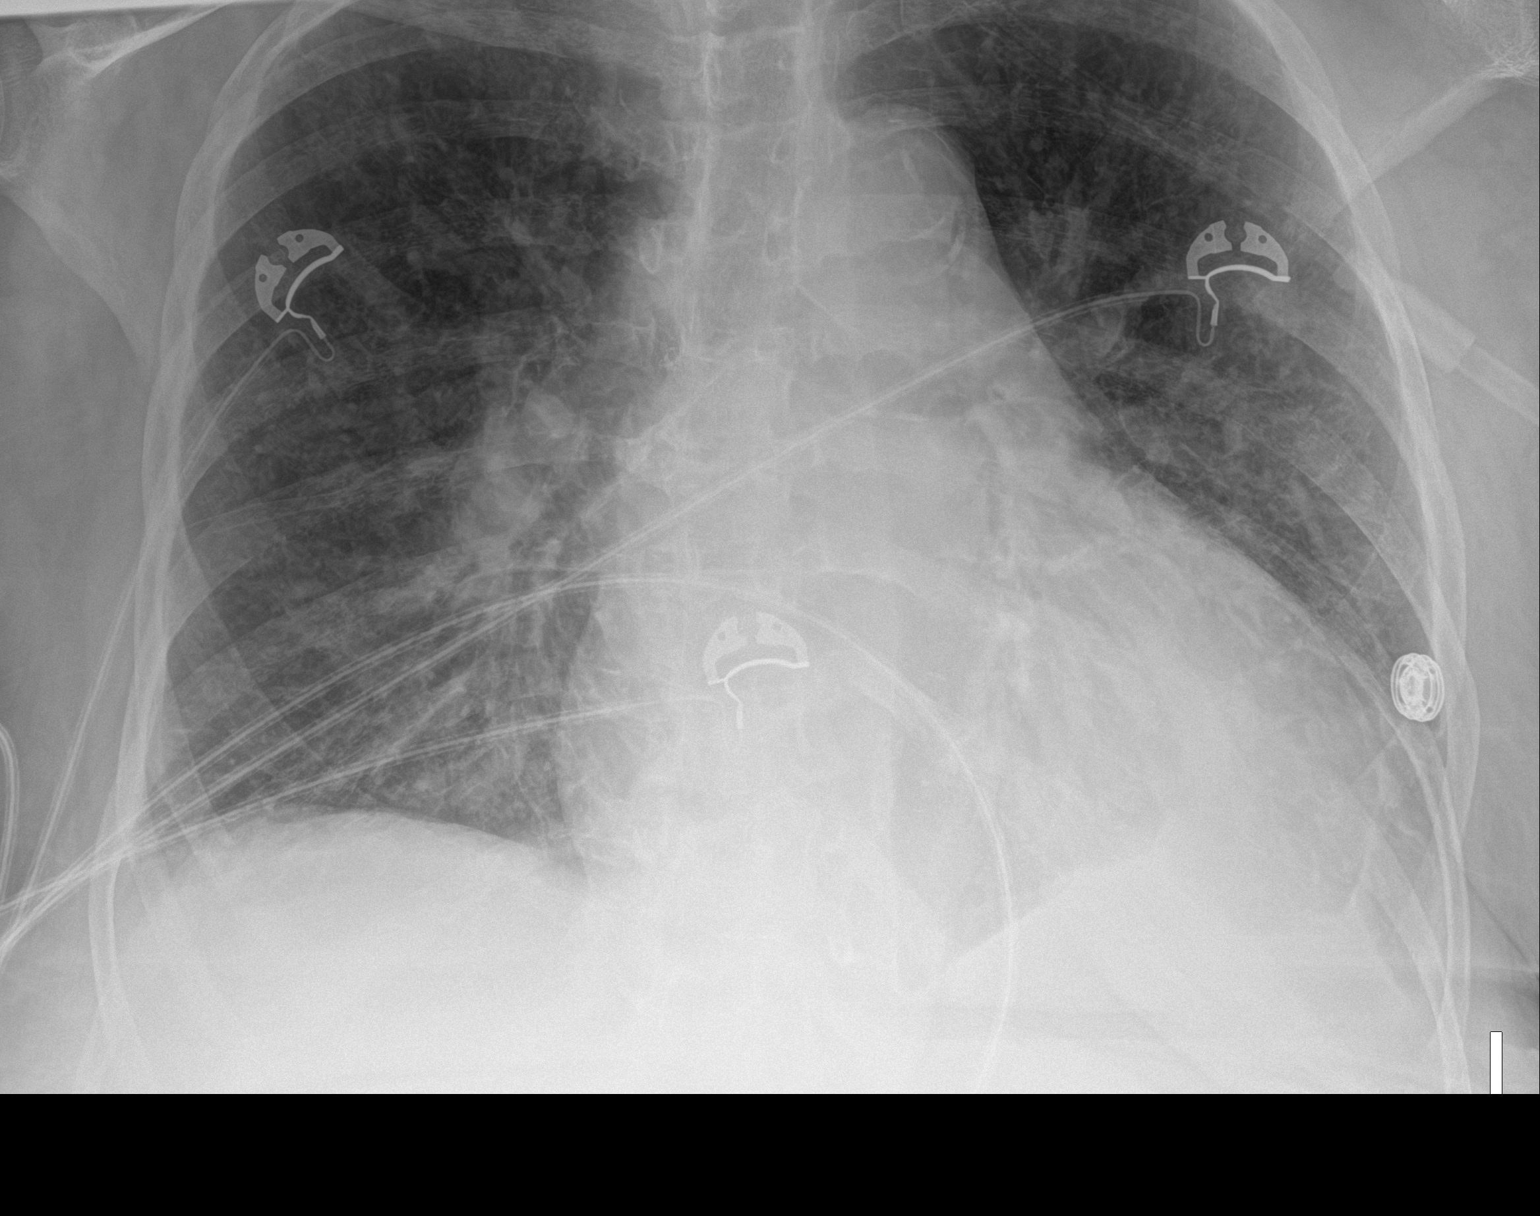

[chest ap (2 of 2)]
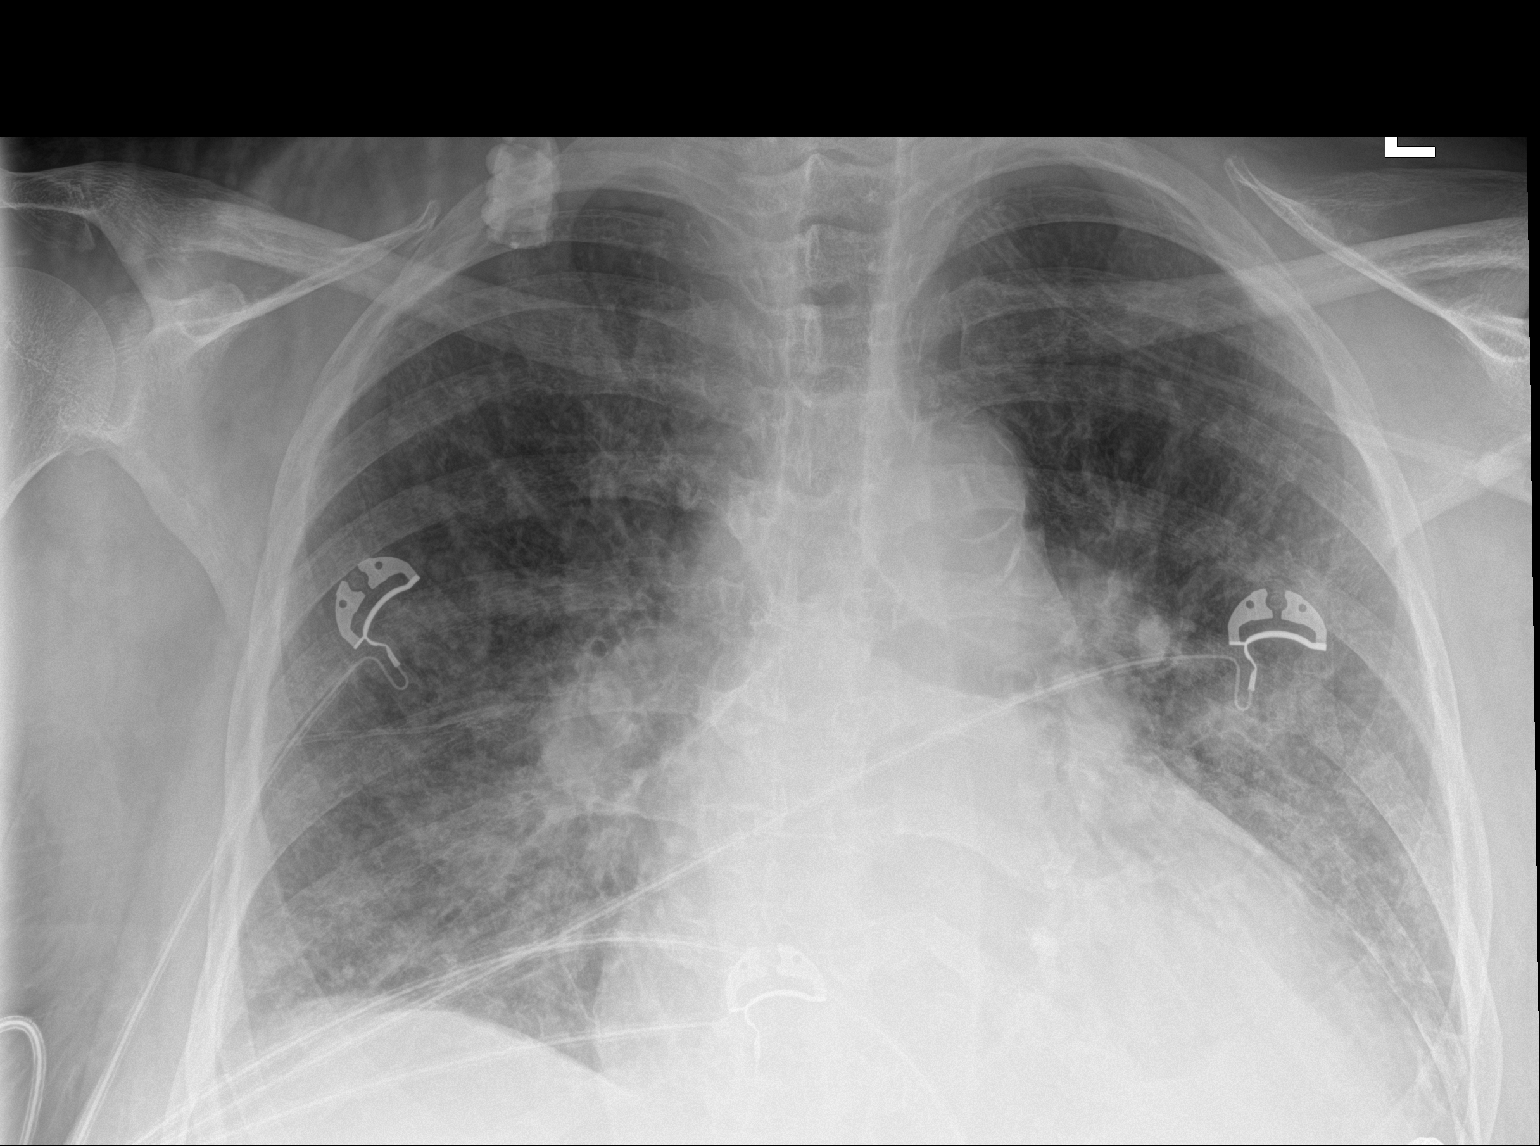

[2 of 2 positions shown; findings below may reference images not displayed]

FINDINGS: Cardiomegaly with central vascular prominence. Aortic
atherosclerosis. Diffuse interstitial opacities. Probable small
bilateral pleural effusions. Partially visualized cervical fusion
hardware.
IMPRESSION: Findings most consistent with CHF.

## 2022-11-26 ENCOUNTER — Ambulatory Visit: Payer: Medicare PPO | Attending: Internal Medicine | Admitting: Internal Medicine

## 2022-11-26 ENCOUNTER — Encounter: Payer: Self-pay | Admitting: Internal Medicine

## 2022-11-26 VITALS — BP 142/54 | HR 60 | Ht 61.0 in | Wt 133.4 lb

## 2022-11-26 DIAGNOSIS — E118 Type 2 diabetes mellitus with unspecified complications: Secondary | ICD-10-CM | POA: Diagnosis not present

## 2022-11-26 DIAGNOSIS — E1159 Type 2 diabetes mellitus with other circulatory complications: Secondary | ICD-10-CM

## 2022-11-26 DIAGNOSIS — Z951 Presence of aortocoronary bypass graft: Secondary | ICD-10-CM

## 2022-11-26 DIAGNOSIS — I350 Nonrheumatic aortic (valve) stenosis: Secondary | ICD-10-CM | POA: Diagnosis not present

## 2022-11-26 DIAGNOSIS — N186 End stage renal disease: Secondary | ICD-10-CM

## 2022-11-26 DIAGNOSIS — E1169 Type 2 diabetes mellitus with other specified complication: Secondary | ICD-10-CM

## 2022-11-26 DIAGNOSIS — E785 Hyperlipidemia, unspecified: Secondary | ICD-10-CM

## 2022-11-26 DIAGNOSIS — I152 Hypertension secondary to endocrine disorders: Secondary | ICD-10-CM

## 2022-11-26 NOTE — Patient Instructions (Addendum)
Medication Instructions:  Your physician recommends that you continue on your current medications as directed. Please refer to the Current Medication list given to you today.  *If you need a refill on your cardiac medications before your next appointment, please call your pharmacy*  Lab Work: If you have labs (blood work) drawn today and your tests are completely normal, you will receive your results only by: MyChart Message (if you have MyChart) OR A paper copy in the mail If you have any lab test that is abnormal or we need to change your treatment, we will call you to review the results.  Testing/Procedures: Your physician has requested that you have an echocardiogram. Echocardiography is a painless test that uses sound waves to create images of your heart. It provides your doctor with information about the size and shape of your heart and how well your heart's chambers and valves are working. This procedure takes approximately one hour. There are no restrictions for this procedure. Please do NOT wear cologne, perfume, aftershave, or lotions (deodorant is allowed). Please arrive 15 minutes prior to your appointment time.  Follow-Up: At [facility], you and your health needs are our priority.  As part of our continuing mission to provide you with exceptional heart care, we have created designated Provider Care Teams.  These Care Teams include your primary Cardiologist (physician) and Advanced Practice Providers (APPs -  Physician Assistants and Nurse Practitioners) who all work together to provide you with the care you need, when you need it.  We recommend signing up for the patient portal called "MyChart".  Sign up information is provided on this After Visit Summary.  MyChart is used to connect with patients for Virtual Visits (Telemedicine).  Patients are able to view lab/test results, encounter notes, upcoming appointments, etc.  Non-urgent messages can be sent to your provider as  well.   To learn more about what you can do with MyChart, go to [email or web address].    Your next appointment:   Someone from our team will call you  Provider:   Alverda Skeans, MD     Diet & Lifestyle recommendations:  Physical activity recommendation (The Physical Activity Guidelines for Americans. JAMA 2018;Nov 12) At least 150-300 minutes a week of moderate-intensity, or 75-150 minutes a week of vigorous-intensity aerobic physical activity, or an equivalent combination of moderate- and vigorous-intensity aerobic activity. Adults should perform muscle-strengthening activities on 2 or more days a week. Older adults should do multicomponent physical activity that includes balance training as well as aerobic and muscle-strengthening activities. Benefits of increased physical activity include lower risk of mortality including cardiovascular mortality, lower risk of cardiovascular events and associated risk factors (hypertension and diabetes), and lower risk of many cancers (including bladder, breast, colon, endometrium, esophagus, kidney, lung, and stomach). Additional improvments have been seen in cognition, risk of dementia, anxiety and depression, improved bone health, lower risk of falls, and associated injuries.  Dietary recommendation The 2019 ACC/AHA guidelines promote nutrition as a main fixture of cardiovascular wellness, with a recommendation for a varied diet of fruit, vegetables, fish, legumes, and whole grains (Class I), as well as recommendations to reduce sodium, cholesterol, processed meats, and refined sugars (Class IIa recommendation).10 Sodium intake, a topic of some controversy as of late, is recommended to be kept at 1,500 mg/day or less, far below the average daily intake in the [place] of 3,409 mg/day, and notably below that of previous [place] recommendations for 300mg /day.10,11 For those unable to reach 1,500 mg/day, they recommend  at least a reduction of 1000 mg/day.  A  [place]es[place]-Mediterranean Diet With Intermittent Fasting: JACC Review Topic of the Week. J Am Coll Cardiol 2020;76:1484-1493 Pes[place]-Mediterranean diet, it is supplemented with extra-virgin olive oil (EVOO), which is the principle fat source, along with moderate amounts of dairy (particularly yogurt and cheese) and eggs, as well as modest amounts of alcohol consumption (ideally red wine with the evening meal), but few red and processed meats.

## 2022-11-26 NOTE — Progress Notes (Signed)
Pre Surgical Assessment: 5 M Walk Test  41M=16.19ft  5 Meter Walk Test- trial 1: 11.70 seconds 5 Meter Walk Test- trial 2: 9.59 seconds 5 Meter Walk Test- trial 3: 11.85 seconds 5 Meter Walk Test Average: 11.04 seconds

## 2022-11-26 NOTE — Addendum Note (Signed)
Addended by: Alverda Skeans on: 11/26/2022 10:05 AM   Modules accepted: Level of Service

## 2022-12-02 ENCOUNTER — Telehealth: Payer: Self-pay

## 2022-12-02 NOTE — Telephone Encounter (Signed)
  [facility]   MULTIDISCIPLINARY HEART VALVE TEAM  Patient's TAVR CT images were reviewed this morning and the team felt that the patient is at risk for paravalvular leak, embolic event and annular rupture but felt that TAVR is not prohibitive.  If TAVR is not pursued then the team recommends a palliative approach for valvular heart disease.  I spoke with the patient and at this time she is still thinking about whether she would like to proceed with surgery.  The patient has an upcoming appointment on 10/29 for an echocardiogram and then she would like to make a decision about treatment.

## 2022-12-09 ENCOUNTER — Ambulatory Visit (HOSPITAL_COMMUNITY): Payer: Medicare PPO | Attending: Cardiovascular Disease

## 2022-12-09 DIAGNOSIS — I152 Hypertension secondary to endocrine disorders: Secondary | ICD-10-CM | POA: Insufficient documentation

## 2022-12-09 DIAGNOSIS — Z951 Presence of aortocoronary bypass graft: Secondary | ICD-10-CM | POA: Diagnosis present

## 2022-12-09 DIAGNOSIS — I350 Nonrheumatic aortic (valve) stenosis: Secondary | ICD-10-CM | POA: Diagnosis present

## 2022-12-09 DIAGNOSIS — E1169 Type 2 diabetes mellitus with other specified complication: Secondary | ICD-10-CM | POA: Diagnosis present

## 2022-12-09 DIAGNOSIS — E785 Hyperlipidemia, unspecified: Secondary | ICD-10-CM | POA: Diagnosis present

## 2022-12-09 DIAGNOSIS — E1159 Type 2 diabetes mellitus with other circulatory complications: Secondary | ICD-10-CM | POA: Diagnosis present

## 2022-12-09 DIAGNOSIS — N186 End stage renal disease: Secondary | ICD-10-CM | POA: Diagnosis present

## 2022-12-09 DIAGNOSIS — E118 Type 2 diabetes mellitus with unspecified complications: Secondary | ICD-10-CM | POA: Diagnosis present

## 2022-12-09 LAB — ECHOCARDIOGRAM COMPLETE
AR max vel: 0.77 cm2
AV Area VTI: 0.8 cm2
AV Area mean vel: 0.78 cm2
AV Mean grad: 52 mm[Hg]
AV Peak grad: 96.8 mm[Hg]
Ao pk vel: 4.92 m/s
Area-P 1/2: 3.51 cm2
S' Lateral: 2.8 cm

## 2022-12-12 ENCOUNTER — Encounter: Payer: Self-pay | Admitting: Surgery

## 2022-12-12 ENCOUNTER — Institutional Professional Consult (permissible substitution) (INDEPENDENT_AMBULATORY_CARE_PROVIDER_SITE_OTHER): Payer: Medicare PPO | Admitting: Surgery

## 2022-12-12 VITALS — BP 154/71 | HR 61 | Resp 20 | Ht 61.0 in | Wt 128.4 lb

## 2022-12-12 DIAGNOSIS — I350 Nonrheumatic aortic (valve) stenosis: Secondary | ICD-10-CM

## 2022-12-12 NOTE — Progress Notes (Signed)
Patient ID: [name], female   DOB: 1960-06-06, 62 y.o.   MRN: [id number]  [facility]VE CLINIC       [address]       [name] [address]             [phone]          CARDIOTHORACIC SURGERY CONSULTATION REPORT  PCP is Malka So., MD Referring Provider is Alverda Skeans, MD Primary Cardiologist is Donato Schultz, MD  Reason for consultation: Critical aortic stenosis  HPI:  The patient is a 62 year old woman with history of type 2 diabetes, hypertension, ESRD on hemodialysis, previous smoking with oxygen dependent COPD, coronary artery disease status post CABG x 2 by me on 09/13/2020 (LIMA to LAD and SVG to PDA), and aortic stenosis who was referred for consideration of aortic valve replacement.  She is here with her daughter.  She reports progressive exertional shortness of breath and fatigue.  She contracted COVID in early 2023 and was placed on oxygen after that.  She had pulmonary function testing showing an FEV1 of 0.91 which was 50% of predicted, and FVC of 1.1 which was 47% of predicted and a diffusion capacity that was 47 % of predicted.  She feels that her shortness of breath has been increasing to the point where she gets short of breath with minimal activity and has a low quality of life.  She has been able to undergo dialysis without difficulty.  She had an echocardiogram on 12/09/2022 showing a bicuspid aortic valve with a mean gradient of 52 mmHg and a valve area of 0.8 cm.  Left ventricular ejection fraction was normal.  There was moderate mitral annular calcification and moderate tricuspid regurgitation with a moderately enlarged right ventricle with moderate RV systolic dysfunction.  PA pressure was felt to be severely elevated.  Her cardiac catheterization in April 2024 showed a patent LIMA to the LAD and an occluded vein graft to the PDA.  Cardiac index was 7.2 with a PA pressure of 70/29 and a mean of  43.  Mean wedge pressure was 27.  Mean right atrial pressure was 14.  She subsequently had a gated cardiac CTA which showed a patent vein graft to the PDA.   She has had all of her teeth extracted and is planning on having implants done at some point. Past Medical History:  Diagnosis Date   Anemia of chronic disease    Anxiety    Arthritis    oa and psoriatic ra   Asthma    Back pain, chronic    lower back   Chronic insomnia    COPD (chronic obstructive pulmonary disease) (HCC)    Diabetes mellitus    type 2   ESRD on dialysis [facility])    Essential hypertension    GERD (gastroesophageal reflux disease)    Gout    History of hiatal hernia    Hypoalbuminemia    Hyponatremia    Mild aortic stenosis    Mitral regurgitation    Mitral stenosis    Obesity    PAD (peripheral artery disease) (HCC)    a. externial iliac calcification seen on CT 04/2020.   Sleep apnea    Tobacco abuse    Upper GI bleed 05/2019    Past Surgical History:  Procedure Laterality Date   BACK SURGERY  2016   lower back fusion with cage   BIOPSY  09/23/2017   Procedure:  BIOPSY;  Surgeon: Graylin Shiver, MD;  Location: [facility] ENDOSCOPY;  Service: Endoscopy;;   BIOPSY  05/19/2019   Procedure: BIOPSY;  Surgeon: Kathi Der, MD;  Location: [facility] END[facility];  Service: Gastroenterology;;   CESAREAN SECTION  1990   x 1    COLONOSCOPY WITH PROPOFOL N/A 09/23/2017   Procedure: COLONOSCOPY WITH PROPOFOL Hemostatic clips placed;  Surgeon: Graylin Shiver, MD;  Location: [facility] ENDOS[facility];  Service: Endoscopy;  Laterality: N/A;   COLONOSCOPY WITH PROPOFOL N/A 10/02/2020   Procedure: COLONOSCOPY WITH PROPOFOL;  Surgeon: Willis Modena, MD;  Location: [facility] ENDOSCOPY;  Service: Endoscopy;  Laterality: N/A;   CORONARY ARTERY BYPASS GRAFT N/A 09/13/2020   Procedure: CORONARY ARTERY BYPASS GRAFTING (CABG), ON PUMP, TIMES TWO, USING LEFT INTERNAL MAMMARY ARTERY AND RIGHT ENDOSCOPICALLY HARVESTED GREATER SAPHENOUS VEIN;  Surgeon: Alleen Borne, MD;  Location: [facility] OR;  Service: Open Heart Surgery;  Laterality: N/A;   DILATION AND CURETTAGE OF UTERUS  1988   ENTEROSCOPY N/A 07/22/2021   Procedure: ENTEROSCOPY;  Surgeon: Kerin Salen, MD;  Location: [facility] ENDOSCOPY;  Service: Gastroenterology;  Laterality: N/A;   ESOPHAGOGASTRODUODENOSCOPY N/A 09/22/2020   Procedure: ESOPHAGOGASTRODUODENOSCOPY (EGD);  Surgeon: Willis Modena, MD;  Location: [facility] END[facility]COPY;  Service: Endoscopy;  Laterality: N/A;   ESOPHAGOGASTRODUODENOSCOPY (EGD) WITH PROPOFOL N/A 09/23/2017   Procedure: ESOPHAGOGASTRODUODENOSCOPY (EGD) WITH PROPOFOL;  Surgeon: Graylin Shiver, MD;  [facility];  Service: Endoscopy;  Laterality: N/A;   ESOPHAGOGASTRODUODENOSCOPY (EGD) WITH PROPOFOL N/A 05/19/2019   Procedure: ESOPHAGOGASTRODUODENOSCOPY (EGD) WITH PROPOFOL;  Surgeon: Kathi Der, MD;  [facility];  Service: Gastroenterology;  Laterality: N/A;   ESOPHAGOGASTRODUODENOSCOPY (EGD) WITH PROPOFOL N/A 10/02/2020   Procedure: ESOPHAGOGASTRODUODENOSCOPY (EGD) WITH PROPOFOL;  Surgeon: Willis Modena, MD;  Location: [facility] ENDOSCOPY;  Service: Endoscopy;  Laterality: N/A;   GIVENS CAPSULE STUDY N/A 05/19/2019   Procedure: GIVENS CAPSULE STUDY;  Surgeon: Kathi Der, MD;  Location: [facility] ENDOS[facility];  Service: Gastroenterology;  Laterality: N/A;   GIVENS CAPSULE STUDY N/A 07/19/2021   Procedure: GIVENS CAPSULE STUDY;  Surgeon: [name], MD;  Location: [facility] ENDOSCOPY;  Service: Gastroenterology;  Laterality: N/A;   HEMOSTASIS CLIP PLACEMENT  09/22/2020   Procedure: HEMOSTASIS CLIP PLACEMENT;  Surgeon: Willis Modena, MD;  [facility];  Service: Endoscopy;;   HEMOSTASIS CONTROL  09/22/2020   Procedure: HEMOSTASIS CONTROL;  Surgeon: Willis Modena, MD;  Location: [facility] ENDOSCOPY;  Service: Endoscopy;;   HOT HEMOSTASIS N/A 05/19/2019   Procedure: HOT HEMOSTASIS (ARGON PLASMA COAGULATION/BICAP);  Surgeon: Kathi Der, MD;  Location: [facility] ENDOSCOPY;  Service:  Gastroenterology;  Laterality: N/A;   LEFT HEART CATH AND CORONARY ANGIOGRAPHY N/A 09/10/2020   Procedure: LEFT HEART CATH AND CORONARY ANGIOGRAPHY;  Surgeon: Yvonne Kendall, MD;  [facility] INVASIVE CV LAB;  Service: Cardiovascular;  Laterality: N/A;   PARTIAL KNEE ARTHROPLASTY Left 11/12/2017   Procedure: left unicompartmental arthroplasty-medial;  Surgeon: Durene Romans, MD;  Location: [facility] ORS;  Service: Orthopedics;  Laterality: Left;    RIGHT HEART CATH AND CORONARY/GRAFT ANGIOGRAPHY N/A 06/26/2022   Procedure: RIGHT HEART CATH AND CORONARY/GRAFT ANGIOGRAPHY;  Surgeon: [name], MD;  [facility] INVASIVE CV LAB;  Service: Cardiovascular;  Laterality: N/A;   SUBMUCOSAL INJECTION  09/23/2017   Procedure: SUBMUCOSAL INJECTION;  Surgeon: Graylin Shiver, MD;  Location: [facility] ENDOS[facility];  Service: Endoscopy;;  in colon   TEE WITHOUT CARDIOVERSION N/A 09/13/2020   Procedure: TRANSESOPHAGEAL ECHOCARDIOGRAM (TEE);  Surgeon: Alleen Borne, MD;  Location: [facility] OR;  Service: Open Heart Surgery;  Laterality:  N/A;   TUBAL LIGATION  1990    Family History  Problem Relation Age of Onset   Lung cancer Mother    Diabetes Sister    Heart disease Sister    Asthma Daughter    Arthritis Daughter    Arthritis Daughter    Thyroid cancer Other        1/2 sister   Heart disease Other        1/2 sister    Social History   Socioeconomic History   Marital status: Divorced    Spouse name: BASIL   Number of children: 3   Years of education: 1   Highest education level: GED or equivalent  Occupational History   Occupation: retired Public house manager  Tobacco Use   Smoking status: Former    Current packs/day: 0.00    Average packs/day: 0.5 packs/day for 40.0 years (20.0 ttl pk-yrs)    Types: Cigarettes    Start date: 08/1980    Quit date: 08/2020    Years since quitting: 2.3   Smokeless tobacco: Never  Vaping Use   Vaping status: Never Used  Substance and Sexual Activity   Alcohol use: Yes    Comment:  occ   Drug use: No   Sexual activity: Not on file  Other Topics Concern   Not on file  Social History Narrative   Not on file   Social Determinants of Health   Financial Resource Strain: Low Risk  (06/12/2022)   Received from [facility]   Overall Financial Resource Strain (CARDIA)    Difficulty of Paying Living Expenses: Not hard at all  Recent Concern: Financial Resource Strain - Medium Risk (04/29/2022)   Received from [facility]   Overall Financial Resource Strain (CARDIA)    Difficulty of Paying Living Expenses: Somewhat hard  Food Insecurity: No Food Insecurity (09/20/2022)   Hunger Vital Sign    Worried About Running Out of Food in the Last Year: Never true    Ran Out of Food in the Last Year: Never true  Transportation Needs: No Transportation Needs (09/20/2022)   PRAPARE - Administrator, Civil Service (Medical): No    Lack of Transportation (Non-Medical): No  Physical Activity: Inactive (09/15/2018)   Exercise Vital Sign    Days of Exercise per Week: 0 days    Minutes of Exercise per Session: 0 min  Stress: Stress Concern Present (07/02/2022)   Received from [facility], [facility] - Occupational Stress Questionnaire    Feeling of Stress : To some extent  Social Connections: Unknown (06/23/2021)   Received from [facility], [facility]   Social Network    Social Network: Not on file  Intimate Partner Violence: Not At Risk (11/18/2022)   Received from [facility]   HITS    Over the last 12 months how often did your partner physically hurt you?: 1    Over the last 12 months how often did your partner insult you or talk down to you?: 1    Over the last 12 months how often did your partner threaten you with physical harm?: 1    Over the last 12 months how often did your partner scream or curse at you?: 1    Prior to Admission medications   Medication Sig Start Date End Date Taking?  Authorizing Provider  acetaminophen (TYLENOL) 500 MG tablet Take 1,000 mg by mouth daily as needed (pain).   Yes  [provider]  albuterol (PROVENTIL) (2.5 MG/3ML) 0.083% nebulizer solution Take 3 mLs (2.5 mg total) by nebulization every 6 (six) hours as needed for wheezing or shortness of breath. 07/10/21  Yes Hunsucker, Lesia Sago, MD  allopurinol (ZYLOPRIM) 100 MG tablet Take 100 mg by mouth every evening.   Yes [provider]  carboxymethylcellulose 1 % ophthalmic solution Place 1 drop into both eyes 3 (three) times daily.   Yes [provider]  carvedilol (COREG) 25 MG tablet Take 25 mg by mouth 2 (two) times daily with a meal.   Yes [provider]  cetirizine (ZYRTEC) 10 MG tablet Take 10 mg by mouth daily as needed for allergies.   Yes [provider]  Cholecalciferol (VITAMIN D) 50 MCG (2000 UT) tablet Take 2,000 Units by mouth daily.   Yes [provider]  cinacalcet (SENSIPAR) 30 MG tablet Take 60 mg by mouth every Monday, Wednesday, and Friday with hemodialysis.   Yes [provider]  cloNIDine (CATAPRES - DOSED IN MG/24 HR) 0.3 mg/24hr patch Place 0.3 mg onto the skin every Sunday.   Yes [provider]  diclofenac Sodium (VOLTAREN) 1 % GEL Apply 2 g topically daily as needed (pain).   Yes [provider]  docusate sodium (COLACE) 100 MG capsule Take 100 mg by mouth daily.   Yes [provider]  EPINEPHrine 0.3 mg/0.3 mL IJ SOAJ injection Inject into the muscle as needed for anaphylaxis. 07/12/21  Yes [provider]  fluticasone (FLONASE) 50 MCG/ACT nasal spray Place 1 spray into both nostrils daily as needed for allergies or rhinitis.   Yes [provider]  hydrALAZINE (APRESOLINE) 100 MG tablet Take 1 tablet (100 mg total) by mouth every 8 (eight) hours. 07/06/20  Yes Cresenzo, Cyndi Lennert, MD  hydrocortisone (ANUSOL-HC) 2.5 % rectal cream Place rectally 2 (two) times daily. Patient  taking differently: Place 1 Application rectally 2 (two) times daily as needed for anal itching or hemorrhoids. 07/23/21  Yes Park Pope, MD  isosorbide mononitrate (IMDUR) 60 MG 24 hr tablet Take 1 tablet (60 mg total) by mouth daily. 10/27/22  Yes Jake Bathe, MD  LORazepam (ATIVAN) 1 MG tablet Take 1 mg by mouth every 8 (eight) hours as needed for anxiety. 06/25/20  Yes [provider]  montelukast (SINGULAIR) 10 MG tablet Take 10 mg by mouth daily with supper.    Yes [provider]  multivitamin (RENA-VIT) TABS tablet Take 1 tablet by mouth every Monday, Wednesday, and Friday.   Yes [provider]  NIFEdipine (PROCARDIA XL/NIFEDICAL XL) 60 MG 24 hr tablet Take 60 mg by mouth 2 (two) times daily. 10/01/21  Yes [provider]  olmesartan (BENICAR) 40 MG tablet Take 40 mg by mouth every evening. 05/06/19  Yes [provider]  ondansetron (ZOFRAN) 4 MG tablet Take 4 mg by mouth 2 (two) times daily as needed for nausea or vomiting.   Yes [provider]  OXYGEN Inhale 2-3 L into the lungs as directed.   Yes [provider]  pantoprazole (PROTONIX) 40 MG tablet Take 40 mg by mouth 2 (two) times daily. 06/09/20  Yes [provider]  predniSONE (DELTASONE) 5 MG tablet Take 1 tablet (5 mg total) by mouth daily with breakfast. 02/27/22  Yes Marrianne Mood, MD  Probiotic Product (PROBIOTIC PO) Take 1 capsule by mouth daily.   Yes [provider]  silver sulfADIAZINE (SILVADENE) 1 % cream Apply 1 Application topically daily as needed (  wound care).   Yes [provider]  Tiotropium Bromide-Olodaterol (STIOLTO RESPIMAT) 2.5-2.5 MCG/ACT AERS Inhale 2 puffs once daily into the lungs. 11/28/21  Yes Hunsucker, Lesia Sago, MD  VENTOLIN HFA 108 (90 Base) MCG/ACT inhaler Inhale 2 puffs into the lungs See admin instructions. Take 2 puffs every 4-6 hours as needed for wheezing and shortness of breath 04/08/22  Yes Hunsucker, Lesia Sago, MD  White Petrolatum-Mineral Oil (SOOTHE NIGHTTIME) OINT Place 1 Application into both eyes at bedtime.   Yes [provider]  zolpidem (AMBIEN) 5 MG tablet Take 5 mg by mouth at bedtime as needed for sleep. 02/04/21  Yes [provider]    Current Outpatient Medications  Medication Sig Dispense Refill   acetaminophen (TYLENOL) 500 MG tablet Take 1,000 mg by mouth daily as needed (pain).     albuterol (PROVENTIL) (2.5 MG/3ML) 0.083% nebulizer solution Take 3 mLs (2.5 mg total) by nebulization every 6 (six) hours as needed for wheezing or shortness of breath. 75 mL 6   allopurinol (ZYLOPRIM) 100 MG tablet Take 100 mg by mouth every evening.     carboxymethylcellulose 1 % ophthalmic solution Place 1 drop into both eyes 3 (three) times daily.     carvedilol (COREG) 25 MG tablet Take 25 mg by mouth 2 (two) times daily with a meal.     cetirizine (ZYRTEC) 10 MG tablet Take 10 mg by mouth daily as needed for allergies.     Cholecalciferol (VITAMIN D) 50 MCG (2000 UT) tablet Take 2,000 Units by mouth daily.     cinacalcet (SENSIPAR) 30 MG tablet Take 60 mg by mouth every Monday, Wednesday, and Friday with hemodialysis.     cloNIDine (CATAPRES - DOSED IN MG/24 HR) 0.3 mg/24hr patch Place 0.3 mg onto the skin every Sunday.     diclofenac Sodium (VOLTAREN) 1 % GEL Apply 2 g topically daily as needed (pain).     docusate sodium (COLACE) 100 MG capsule Take 100 mg by mouth daily.     EPINEPHrine 0.3 mg/0.3 mL IJ SOAJ injection Inject into the muscle as needed for anaphylaxis.     fluticasone (FLONASE) 50 MCG/ACT nasal spray Place 1 spray into both nostrils daily as needed for allergies or rhinitis.     hydrALAZINE (APRESOLINE) 100 MG tablet Take 1 tablet (100 mg total) by mouth every 8 (eight) hours. 90 tablet 0   hydrocortisone (ANUSOL-HC) 2.5 % rectal cream Place rectally 2 (two) times daily. (Patient taking differently: Place 1 Application rectally 2 (two) times daily as needed for  anal itching or hemorrhoids.) 30 g 0   isosorbide mononitrate (IMDUR) 60 MG 24 hr tablet Take 1 tablet (60 mg total) by mouth daily. 90 tablet 1   LORazepam (ATIVAN) 1 MG tablet Take 1 mg by mouth every 8 (eight) hours as needed for anxiety.     montelukast (SINGULAIR) 10 MG tablet Take 10 mg by mouth daily with supper.      multivitamin (RENA-VIT) TABS tablet Take 1 tablet by mouth every Monday, Wednesday, and Friday.     NIFEdipine (PROCARDIA XL/NIFEDICAL XL) 60 MG 24 hr tablet Take 60 mg by mouth 2 (two) times daily.     olmesartan (BENICAR) 40 MG tablet Take 40 mg by mouth every evening.     ondansetron (ZOFRAN) 4 MG tablet Take 4 mg by mouth 2 (two) times daily as needed for nausea or vomiting.     OXYGEN Inhale 2-3 L into the lungs as directed.  pantoprazole (PROTONIX) 40 MG tablet Take 40 mg by mouth 2 (two) times daily.     predniSONE (DELTASONE) 5 MG tablet Take 1 tablet (5 mg total) by mouth daily with breakfast.     Probiotic Product (PROBIOTIC PO) Take 1 capsule by mouth daily.     silver sulfADIAZINE (SILVADENE) 1 % cream Apply 1 Application topically daily as needed (wound care).     Tiotropium Bromide-Olodaterol (STIOLTO RESPIMAT) 2.5-2.5 MCG/ACT AERS Inhale 2 puffs once daily into the lungs. 4 g 3   VENTOLIN HFA 108 (90 Base) MCG/ACT inhaler Inhale 2 puffs into the lungs See admin instructions. Take 2 puffs every 4-6 hours as needed for wheezing and shortness of breath 18 g 6   White Petrolatum-Mineral Oil (SOOTHE NIGHTTIME) OINT Place 1 Application into both eyes at bedtime.     zolpidem (AMBIEN) 5 MG tablet Take 5 mg by mouth at bedtime as needed for sleep.     No current facility-administered medications for this visit.    Allergies  Allergen Reactions   3-Methyl-2-Benzothiazolinone Hydrazone Other (See Comments)    Muscle weakness muscle cramping in legs Other reaction(s): Myalgias (Muscle Pain)   Banana Anaphylaxis, Swelling and Other (See Comments)    TONGUE AND  MOUTH SWELLING   Black Walnut Flavor Anaphylaxis and Itching    Walnuts   Hazelnut (Filbert) Anaphylaxis    Hazelnuts   Leflunomide And Related Other (See Comments)    Severe Headache   Lisinopril Swelling    Angioedema  Swelling of the face   No Healthtouch Food Allergies Anaphylaxis    Spicy Mustard 4.7.2021 Pt reports that she eats Regular Yellow Mustard Swelling and itching of tongue   Other Anaphylaxis, Swelling and Other (See Comments)    Cosentyx= angioedema Mouth itches and tongue swelling Hazel nuts and pecans also Walnuts Renal failure Serotonin syndrome  Tremors Muscle weakness muscle cramping in legs Other reaction(s): Myalgias (Muscle Pain)   Pecan Nut (Diagnostic) Anaphylaxis and Itching    Pecans   Trazodone And Nefazodone Other (See Comments)    Serotonin Syndrome  Tremors   Adalimumab Other (See Comments)    Blisters on abdomen =humira   Escitalopram Oxalate Other (See Comments)    Hand problems - Serotonin Syndrome     Ezetimibe Other (See Comments)    myalgias cramps    Secukinumab Swelling    Cosentyx = angiodema    Statins Other (See Comments)    Leg pains and weakness   Ferrlecit [Na Ferric Gluc Cplx In Sucrose]     Not reaction given from HD   Gabapentin Other (See Comments)    tremors   Nsaids     Avoid due to renal problems       Review of Systems:   General:  + decreased appetite, + decreased energy, no weight gain, + weight loss, no fever  Cardiac:  + chest pain with exertion, + chest pain at rest, + SOB with mild exertion, + resting SOB, no PND, + orthopnea, no palpitations, no arrhythmia, no atrial fibrillation, no LE edema, no dizzy spells, no syncope  Respiratory:  +  shortness of breath, + home oxygen, no productive cough, no dry cough, no bronchitis, + wheezing, no hemoptysis, no asthma, no pain with inspiration or cough, + sleep apnea, no CPAP at night  GI:   no difficulty swallowing, + reflux, no frequent heartburn, +  hiatal hernia, no abdominal pain, + constipation, no diarrhea, no hematochezia, no hematemesis, + melena  GU:  no dysuria,  no frequency, no urinary tract infection, no hematuria, no kidney stones, + end stage kidney disease  Vascular:  no pain suggestive of claudication, no pain in feet, no leg cramps, no varicose veins, no DVT, no non-healing foot ulcer  Neuro:   no stroke, no TIA's, no seizures, no headaches, no temporary blindness one eye,  no slurred speech, + peripheral neuropathy, + chronic pain, no instability of gait, no memory/cognitive dysfunction  Musculoskeletal: + arthritis, no joint swelling, no myalgias, no difficulty walking, normal mobility   Skin:   no rash, no itching, no skin infections, no pressure sores or ulcerations  Psych:   + anxiety, no depression, no nervousness, no unusual recent stress  Eyes:   no blurry vision, no floaters, no recent vision changes, no glasses or contacts  ENT:   no hearing loss, no loose or painful teeth, no dentures, last saw dentist 6/224  Hematologic:  no easy bruising, no abnormal bleeding, no clotting disorder, no frequent epistaxis  Endocrine:  + diabetes, does check CBG's at home     Physical Exam:   Ht 5\' 1"  (1.549 m)   BMI 25.21 kg/m   General:  Frail-appearing woman in no distress, on oxygen  HEENT:  Unremarkable, NCAT, PERLA, EOMI  Neck:   no JVD, + bilateral bruits or transmitted murmur, no adenopathy   Chest:   clear to auscultation, symmetrical breath sounds, no wheezes, no rhonchi   CV:   RRR, 3/6 harsh systolic murmur RSB, no diastolic murmur  Abdomen:  soft, non-tender, no masses   Extremities:  warm, well-perfused, pedal pulses palpable, no lower extremity edema  Rectal/GU  Deferred  Neuro:   Grossly non-focal and symmetrical throughout  Skin:   Clean and dry, no rashes, no breakdown  Diagnostic Tests:  ECHOCARDIOGRAM REPORT       Patient Name:   Jodi Bell Date of Exam: 12/09/2022  Medical Rec #:   [id number]        Height:       61.0 in  Accession #:    [id number]       Weight:       133.4 lb  Date of Birth:  11-24-1960         BSA:          1.590 m  Patient Age:    62 years         BP:           142/54 mmHg  Patient Gender: F                HR:           63 bpm.  Exam Location:  [address]   Procedure: 2D Echo, Cardiac Doppler and Color Doppler   Indications:    I35.0 AS    History:        Patient has prior history of Echocardiogram examinations,  most                 recent 07/17/2021. Prior CABG, ESRD, AS,  Signs/Symptoms:Shortness                 of Breath; Risk Factors:Diabetes, Hypertension and  Dyslipidemia.    Sonographer:   Samule Ohm RDCS  Referring Phys: [id number] [name]     Sonographer Comments: Image acquisition challenging due to respiratory  motion and Patient movement.  IMPRESSIONS     1. Left ventricular ejection fraction, by estimation, is 60  to 65%. The  left ventricle has normal function. The left ventricle has no regional  wall motion abnormalities. There is severe concentric left ventricular  hypertrophy. Left ventricular diastolic   parameters are consistent with Grade II diastolic dysfunction  (pseudonormalization). Elevated left atrial pressure.   2. Right ventricular systolic function is moderately reduced. The right  ventricular size is moderately enlarged. Mildly increased right  ventricular wall thickness. There is severely elevated pulmonary artery  systolic pressure. The estimated right  ventricular systolic pressure is 71.0 mmHg.   3. Left atrial size was severely dilated.   4. Right atrial size was moderately dilated.   5. The mitral valve is degenerative. Trivial mitral valve regurgitation.  Moderate mitral annular calcification.   6. The tricuspid valve is abnormal. Tricuspid valve regurgitation is  moderate to severe.   7. The aortic valve is bicuspid. Aortic valve regurgitation is not  visualized. Severe aortic valve  stenosis. Aortic valve mean gradient  measures 52.0 mmHg. Aortic valve Vmax measures 4.92 m/s. Aortic valve  acceleration time measures 108 msec.   8. The inferior vena cava is dilated in size with <50% respiratory  variability, suggesting right atrial pressure of 15 mmHg.   Comparison(s): A prior study was performed on 07/17/2021. Aortic stenosis  is now severe, tricuspid insufficiency is worse.   FINDINGS   Left Ventricle: Left ventricular ejection fraction, by estimation, is 60  to 65%. The left ventricle has normal function. The left ventricle has no  regional wall motion abnormalities. The left ventricular internal cavity  size was normal in size. There is   severe concentric left ventricular hypertrophy. Left ventricular  diastolic parameters are consistent with Grade II diastolic dysfunction  (pseudonormalization). Elevated left atrial pressure.   Right Ventricle: The right ventricular size is moderately enlarged. Mildly  increased right ventricular wall thickness. Right ventricular systolic  function is moderately reduced. There is severely elevated pulmonary  artery systolic pressure. The tricuspid   regurgitant velocity is 3.74 m/s, and with an assumed right atrial  pressure of 15 mmHg, the estimated right ventricular systolic pressure is  71.0 mmHg.   Left Atrium: Left atrial size was severely dilated.   Right Atrium: Right atrial size was moderately dilated.   Pericardium: There is no evidence of pericardial effusion.   Mitral Valve: The mitral valve is degenerative in appearance. Moderate  mitral annular calcification. Trivial mitral valve regurgitation.   Tricuspid Valve: The tricuspid valve is abnormal. Tricuspid valve  regurgitation is moderate to severe.   Aortic Valve: Fused left and non coronary cusps (Sievers type 1). The  aortic valve is bicuspid. Aortic valve regurgitation is not visualized.  Severe aortic stenosis is present. Aortic valve mean gradient  measures  52.0 mmHg. Aortic valve peak gradient  measures 96.8 mmHg. Aortic valve area, by VTI measures 0.80 cm.   Pulmonic Valve: The pulmonic valve was normal in structure. Pulmonic valve  regurgitation is mild. No evidence of pulmonic stenosis.   Aorta: The aortic root and ascending aorta are structurally normal, with  no evidence of dilitation.   Venous: The inferior vena cava is dilated in size with less than 50%  respiratory variability, suggesting right atrial pressure of 15 mmHg.   IAS/Shunts: No atrial level shunt detected by color flow Doppler.     LEFT VENTRICLE  PLAX 2D  LVIDd:         4.20 cm   Diastology  LVIDs:         2.80 cm  LV e' medial:    7.07 cm/s  LV PW:         1.80 cm   LV E/e' medial:  29.3  LV IVS:        2.00 cm   LV e' lateral:   10.40 cm/s  LVOT diam:     1.90 cm   LV E/e' lateral: 19.9  LV SV:         86  LV SV Index:   54  LVOT Area:     2.84 cm     RIGHT VENTRICLE  RV S prime:     8.59 cm/s  TAPSE (M-mode): 1.6 cm  RVSP:           64.0 mmHg   LEFT ATRIUM             Index        RIGHT ATRIUM           Index  LA diam:        4.80 cm 3.02 cm/m   RA Pressure: 8.00 mmHg  LA Vol (A2C):   74.0 ml 46.54 ml/m  RA Area:     24.70 cm  LA Vol (A4C):   99.1 ml 62.32 ml/m  RA Volume:   77.00 ml  48.42 ml/m  LA Biplane Vol: 86.9 ml 54.65 ml/m   AORTIC VALVE  AV Area (Vmax):    0.77 cm  AV Area (Vmean):   0.78 cm  AV Area (VTI):     0.80 cm  AV Vmax:           492.00 cm/s  AV Vmean:          331.000 cm/s  AV VTI:            1.075 m  AV Peak Grad:      96.8 mmHg  AV Mean Grad:      52.0 mmHg  LVOT Vmax:         133.00 cm/s  LVOT Vmean:        90.500 cm/s  LVOT VTI:          0.304 m  LVOT/AV VTI ratio: 0.28    AORTA  Ao Root diam: 3.00 cm  Ao Asc diam:  3.20 cm   MITRAL VALVE                TRICUSPID VALVE  MV Area (PHT): 3.51 cm     TR Peak grad:   56.0 mmHg  MV Decel Time: 216 msec     TR Vmax:        374.00 cm/s  MV E  velocity: 207.00 cm/s  Estimated RAP:  8.00 mmHg  MV A velocity: 107.00 cm/s  RVSP:           64.0 mmHg  MV E/A ratio:  1.93                              SHUNTS                              Systemic VTI:  0.30 m                              Systemic Diam: 1.90 cm   Rachelle Hora Croitoru MD  Electronically signed by Thurmon Fair MD  Signature Date/Time:  12/09/2022/6:37:52 PM        Final      Physicians  Panel Physicians Referring Physician Case Authorizing Physician  [name], MD (Primary)     Procedures  RIGHT HEART CATH AND CORONARY/GRAFT ANGIOGRAPHY   Conclusion      Prox RCA lesion is 20% stenosed.   Mid RCA lesion is 95% stenosed.   Dist RCA lesion is 100% stenosed.   Mid LAD lesion is 100% stenosed.   Prox Cx to Mid Cx lesion is 40% stenosed.   Non-stenotic Origin lesion.   1.  Patent LIMA to LAD and occluded vein graft to PDA. 2.  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 3.  Fick cardiac output of 11.7 L/min and Fick cardiac index of 7.2 L/min/m with the following hemodynamics:            Right atrial pressure mean of 14 mmHg            Right ventricular pressure 67/10 with an end-diastolic pressure of 20 mmHg            Pulmonary artery pressure 70/29 with a mean of 43 mmHg            Wedge pressure mean of 27 mmHg            Pulmonary artery pulsatility index of 2.9            Pulmonary vascular resistance of 1.4 Woods units   Recommendation: Continue evaluation for aortic valve intervention; check CBC next week.   ADDENDUM 07/24/22:  TAVR CTA demonstrated patency of vein graft to PDA.  On review of angiographic images, competitive flow is seen in PDA suggesting patency.                 Indications  Nonrheumatic aortic valve stenosis [I35.0 (ICD-10-CM)]   Procedural Details  Technical Details The patient is a 62 year old female with a history of severe symptomatic  aortic stenosis, coronary artery disease status post CABG consisting of a LIMA to LAD and vein graft to PDA, end-stage renal disease on hemodialysis, type 2 diabetes, hypertension, hyperlipidemia, and history of GI bleed with iron deficiency anemia was seen in the outpatient setting due to increasing shortness of breath.  She is referred for right heart catheterization, coronary angiography was, and bypass angiography study as a preprocedural evaluation for an aortic valve intervention.  After obtaining consent the patient brought to the cardiac catheterization laboratory and prepped and draped sterile fashion.  Xylocaine was used to anesthetize the right groin and ultrasound was used to gain access to the right common femoral artery.  A 6 French sheath was placed.  Ultrasound was also used to gain access to the right common femoral vein and a 5 French sheath was placed.  Right heart catheterization performed with a 5 French balloontipped catheter, coronary angiography was performed with 5 [place] JL 4 catheter, 5 [place] JR4 catheter, 6 French IMA catheter, and 6 [place] multipurpose catheter.  After review of the angiogram no interventions were pursued.  We tried to manage the femoral arteriotomy with a Angio-Seal but the Angio-Seal would not advance into the vessel therefore manual pressure will be applied for both sheaths.  There were no acute complications. Estimated blood loss <50 mL.   During this procedure medications were administered to achieve and maintain moderate conscious sedation while the patient's heart rate, blood pressure, and oxygen saturation were continuously  monitored and I was present face-to-face 100% of this time.   Medications (Filter: Administrations occurring from 0731 to 0858 on 06/26/22)  important  Continuous medications are totaled by the amount administered until 06/26/22 0858.   Heparin (Porcine) in NaCl 1000-0.9 UT/500ML-% SOLN (mL)  Total volume: 1,000 mL Date/Time  Rate/Dose/Volume Action   06/26/22 0759 500 mL Given   0759 500 mL Given   fentaNYL (SUBLIMAZE) injection (mcg)  Total dose: 50 mcg Date/Time Rate/Dose/Volume Action   06/26/22 0746 25 mcg Given   0802 25 mcg Given   midazolam (VERSED) injection (mg)  Total dose: 2 mg Date/Time Rate/Dose/Volume Action   06/26/22 0747 1 mg Given   0802 1 mg Given   lidocaine (PF) (XYLOCAINE) 1 % injection (mL)  Total volume: 10 mL Date/Time Rate/Dose/Volume Action   06/26/22 0756 10 mL Given   iohexol (OMNIPAQUE) 350 MG/ML injection (mL)  Total volume: 85 mL Date/Time Rate/Dose/Volume Action   06/26/22 0849 85 mL Given    Sedation Time  Sedation Time Physician-1: 58 minutes 22 seconds Contrast     Administrations occurring from 0731 to 0858 on 06/26/22:  Medication Name Total Dose  iohexol (OMNIPAQUE) 350 MG/ML injection 85 mL   Radiation/Fluoro  Fluoro time: 14.9 (min) DAP: 17817 (mGycm2) Cumulative Air Kerma: 253 (mGy) Complications  Complications documented before study signed (07/24/2022  2:02 PM)   No complications were associated with this study.  Documented by Lidia Collum, RCIS - 06/26/2022  7:30 AM     Coronary Findings  Diagnostic Dominance: Right Left Main  Vessel is large. Vessel is angiographically normal.    Left Anterior Descending  Vessel is large.  Mid LAD lesion is 100% stenosed.    First Diagonal Branch  Vessel is moderate in size.    Second Diagonal Branch  Vessel is moderate in size.    Third Diagonal Branch  Vessel is small in size.    Left Circumflex  Vessel is large. There is mild diffuse disease throughout the vessel.  Prox Cx to Mid Cx lesion is 40% stenosed.    First Obtuse Marginal Branch  Vessel is small in size.    Second Obtuse Marginal Branch  Vessel is large in size.    Right Coronary Artery  Vessel is large. The vessel is calcified. The vessel is mildly tortuous.  Prox RCA lesion is 20% stenosed. The lesion is  eccentric. The lesion is moderately calcified.  Mid RCA lesion is 95% stenosed. The lesion is eccentric. The lesion is severely calcified.  Dist RCA lesion is 100% stenosed. The lesion is eccentric. The lesion is calcified.    Right Posterior Descending Artery  Vessel is large in size.    Right Posterior Atrioventricular Artery  Vessel is large in size.    First Right Posterolateral Branch  Vessel is small in size.    Second Right Posterolateral Branch  Vessel is large in size.    Third Right Posterolateral Branch  Vessel is small in size.    Graft To Dist RCA  Non-stenotic Origin lesion.    LIMA Graft To Dist LAD    Intervention   No interventions have been documented.   Coronary Diagrams  Diagnostic Dominance: Right  Intervention   Implants   Vascular Products  Device Wire Angioseal 58fr [phone] - [place]  Inventory item: DEVICE Arlean Hopping 6FR Model/Cat number: [id number]  Manufacturer: Birdie Riddle Lot number: [id number]  Device identifier: [id number] Device identifier type: GS1  GUDID Information  Request  status Successful    Brand name: Zara Council Version/Model: [id number]  Company name: [employer] MRI safety info as of 06/26/22: MR Safe  Contains dry or latex rubber: No    GMDN P.T. name: Femoral artery closure plug/patch, collagen    As of 06/26/2022  Status: [employer]   Show images for CARDIAC CATHETERIZATION Images on Long Term Storage   Show images for [place], [place] to Procedure Log  Procedure Log   Link to Procedure Log  Procedure Log    Hemo Data  Flowsheet Row Most Recent Value  Fick Cardiac Output 11.76 L/min  Fick Cardiac Output Index 7.28 (L/min)/BSA  RA A Wave 17 mmHg  RA V Wave 16 mmHg  RA Mean 14 mmHg  RV Systolic Pressure 67 mmHg  RV Diastolic Pressure 10 mmHg  RV EDP 20 mmHg  PA Systolic Pressure 70 mmHg  PA Diastolic Pressure 29 mmHg  PA Mean 43 mmHg  PW A Wave 27  mmHg  PW V Wave 31 mmHg  PW Mean 27 mmHg  AO Systolic Pressure 131 mmHg  AO Diastolic Pressure 61 mmHg  AO Mean 93 mmHg  QP/QS 0.85  TPVR Index 6.95 HRUI  TSVR Index 12.77 HRUI  PVR SVR Ratio 0.24  TPVR/TSVR Ratio 0.54   Narrative & Impression  CLINICAL DATA:  Aortic Stenosis   EXAM: Cardiac TAVR CT   TECHNIQUE: The patient was scanned on a Siemens Force 192 slice scanner. A 120 kV retrospective scan was triggered in the ascending thoracic aorta at 140 HU's. Gantry rotation speed was 250 msecs and collimation was .6 mm. No beta blockade or nitro were given. The 3D data set was reconstructed in 5% intervals of the R-R cycle. Systolic and diastolic phases were analyzed on a dedicated work station using MPR, MIP and VRT modes. The patient received 80 cc of contrast.   FINDINGS: Aortic Valve: Tri leaflet calcified with restricted leaflet motion Calcium score 2378   Aorta: Moderate calcific atherosclerosis normal arch vessels   Sino-tubular Junction: 26 mm   Ascending Thoracic Aorta: 37 mm   Aortic Arch: 23 mm   Descending Thoracic Aorta: 21 mm   Sinus of Valsalva Measurements:   Non-coronary: 30.4 mm  Height 18 mm   Right - coronary: 26.5 mm  Height 22.0 mm   Left -   coronary: 30.0 mm  Height 24 mm   Coronary Artery Height above Annulus:   Left Main: 18.2 mm above annulus   Right Coronary: 16.2 mm above annulus   Virtual Basal Annulus Measurements:   Maximum / Minimum Diameter: 23.1 mm x 20.8 mm   Perimeter: 74.7 mm   Area: 414 mm2   Approximately 50% of the annulus has dense calcification Measures as thick as 5.7 mm I believe this makes patient high risk for PVL and annular disruption with TAVR   Also makes it difficult to measure the annulus   Coronary Arteries: Patent LIMA to LAD Patent SVG to RCA Heart cath suggested occluded SVG to RCA but patent Review of angiogram also shows competitive flow to the distal native RCA   Optimum Fluoroscopic  Angle for Delivery: LAO 7 Caudal 5 degrees   IMPRESSION: 1. Calcified tri leaflet AV with score 2378   2. Annular area difficult to measure. Aortic annulus with dense calcium approximately 50% of its perimeter. Calcium as thick as 5.7 mm Derived area 414 mm2 would be suitable for 23 mm Sapien 3  valve However suspect patient is high risk for PVL and annular disruption   3. Coronary arteries sufficient height above annulus for deployment Patent LIMA to LAD The SVG to the RCA is patent and not occluded as suggested on heart cath   4.  Optimum angiographic angle for deployment LAO 7 Caudal 5 degrees   5.  Short membranous septal length 4.04 mm   Charlton Haws   Electronically Signed: By: Charlton Haws M.D. On: 07/24/2022 12:27      Narrative & Impression  CLINICAL DATA:  Aortic valve replacement preop evaluation   EXAM: CT ANGIOGRAPHY CHEST, ABDOMEN AND PELVIS   TECHNIQUE: Non-contrast CT of the chest was initially obtained.   Multidetector CT imaging through the chest, abdomen and pelvis was performed using the standard protocol during bolus administration of intravenous contrast. Multiplanar reconstructed images and MIPs were obtained and reviewed to evaluate the vascular anatomy.   RADIATION DOSE REDUCTION: This exam was performed according to the departmental dose-optimization program which includes automated exposure control, adjustment of the mA and/or kV according to patient size and/or use of iterative reconstruction technique.   CONTRAST:  OMNIPAQUE IOHEXOL 350 MG/ML SOLN   COMPARISON:  High-resolution chest CT dated August 27, 2021   FINDINGS: CTA CHEST FINDINGS   Cardiovascular: Cardiomegaly. No pericardial effusion. Aortic valve thickening calcifications. Calcifications of the left ventricular outflow tract. Mitral annular calcifications. Normal caliber thoracic aorta with moderate atherosclerotic disease. Severe main and three-vessel coronary artery  calcifications status post CABG.   Mediastinum/Nodes: Small hiatal hernia. Thyroid is unremarkable. Mildly enlarged mediastinal lymph nodes likely reactive, reference subcarinal lymph node measuring 13 mm in short axis on series 7, image 60.   Lungs/Pleura: Central airways are patent. Trace right-greater-than-left pleural effusions and bibasilar atelectasis. Stable right lower lobe pulmonary nodule measuring 4 mm on series 8, image 77. Stable subpleural solid nodule of the left upper lobe measuring 4 mm on image 44. No specific follow-up is necessary for pulmonary nodules given greater than 1 year stability.   Musculoskeletal: Diffuse osseous sclerosis. Orthopedic hardware of the cervical spine. Prior median sternotomy.   CTA ABDOMEN AND PELVIS FINDINGS   Hepatobiliary: Suspicious focal liver abnormality. Mild gallbladder wall thickening. No biliary ductal dilation.   Pancreas: Unremarkable. No pancreatic ductal dilatation or surrounding inflammatory changes.   Spleen: Normal in size without focal abnormality.   Adrenals/Urinary Tract: Bilateral adrenal glands are unremarkable. Hydronephrosis. Numerous bilateral low-attenuation renal lesions, largest are compatible with simple cysts, others are too small to accurately characterize, no specific follow-up imaging is recommended. Bladder is decompressed.   Stomach/Bowel: Wall thickening of the distal stomach. Diverticulosis. No evidence of bowel wall thickening, distention, or inflammatory changes.   Vascular/lymphatic: Normal caliber abdominal aorta with moderate atherosclerotic disease   Reproductive: Uterus and bilateral adnexa are unremarkable.   Other: Small volume abdominopelvic ascites.   Musculoskeletal: Diffuse body wall edema. Diffuse osseous sclerosis. No aggressive appearing osseous lesions. Posterior fusion of L3-L4.   VASCULAR MEASUREMENTS PERTINENT TO TAVR:   AORTA:   Minimal Aortic Diameter-13.5 mm    Severity of Aortic Calcification-moderate   RIGHT PELVIS:   Right Common Iliac Artery -   Minimal Diameter-9.2 mm   Tortuosity-mild   Calcification-moderate   Right External Iliac Artery -   Minimal Diameter-9.0 mm   Tortuosity-mild   Calcification-none   Right Common Femoral Artery -   Minimal Diameter-8.4 mm   Tortuosity-none   Calcification-mild   LEFT PELVIS:   Left Common Iliac Artery -  Minimal Diameter-9.3 mm   Tortuosity-mild   Calcification-moderate   Left External Iliac Artery -   Minimal Diameter-9.0 mm   Tortuosity-mild   Calcification-none   Left Common Femoral Artery -   Minimal Diameter-8.1 mm   Tortuosity-none   Calcification-none   Review of the MIP images confirms the above findings.   IMPRESSION: Vascular:   1. Vascular findings and measurements pertinent to potential TAVR procedure, as detailed above. 2. Thickening and calcification of the aortic valve, compatible with reported clinical history of aortic stenosis. 3. Moderate aortoiliac atherosclerosis. Severe coronary artery calcifications status post CABG.   Nonvascular:   1. Small volume abdominopelvic ascites. 2. 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 3. Trace bilateral pleural effusions and bibasilar atelectasis. 4. Mild gallbladder wall thickening, likely related to cardiac dysfunction. 5. Wall thickening of the distal stomach could be due to underdistention or gastritis. If there is clinical concern, endoscopy could be performed for better evaluation.     Electronically Signed   By: Allegra Lai M.D.   On: 07/24/2022 12:18     Impression:  This 62 year old woman has stage D, severe, symptomatic aortic stenosis with NYHA class III symptoms of exertional fatigue and shortness of breath consistent with chronic diastolic congestive heart failure.  I have personally reviewed her 2D echo, cardiac catheterization, and CTA studies.   Her echo shows a bicuspid aortic valve with heavy annular calcification with a mean gradient of 52 mmHg and a valve area by VTI of 0.8 cm consistent with severe aortic stenosis.  Left ventricular ejection fraction is normal.  There is moderate tricuspid regurgitation with moderate RV systolic dysfunction and RV dilation.  Cardiac catheterization showed severe native coronary disease with a patent LIMA to the LAD but the saphenous vein graft to the PDA could not be visualized.  This was noted to be patent on her gated cardiac CTA.  She has severe pulmonary hypertension.  I think aortic valve replacement is the best treatment to try to improve her symptoms and prevent progressive biventricular dysfunction.  Hopefully replacing her aortic valve would bring down her pulmonary artery pressures and reduce the pressure overload of the right side and the tricuspid regurgitation.  I do not think she is a candidate for open surgical aortic valve replacement given her previous heart surgery, end-stage renal disease on hemodialysis, oxygen dependent lung disease, and other comorbidities.  I think transcatheter aortic valve replacement would be a reasonable alternative.  Her measurements are suitable for a 23 mm SAPIEN 3 valve.  She does have extensive annular and subannular calcium along the left and right cusps which could increase the risk of paravalvular leak and annular rupture.  I reviewed the CT and echo images with her and her family and answered all of their questions.  Her abdominal and pelvic CTA shows adequate pelvic vascular anatomy to allow transfemoral insertion.  The patient and her daughter were counseled at length regarding treatment alternatives for management of severe symptomatic aortic stenosis. The risks and benefits of surgical intervention has been discussed in detail. Long-term prognosis with medical therapy was discussed. Alternative approaches such as conventional surgical aortic valve replacement,  transcatheter aortic valve replacement, and palliative medical therapy were compared and contrasted at length. This discussion was placed in the context of the patient's own specific clinical presentation and past medical history. All of their questions have been addressed.   Following the decision to proceed with transcatheter aortic valve replacement, a discussion was held regarding what  types of management strategies would be attempted intraoperatively in the event of life-threatening complications.  I do not think she is a candidate for emergent sternotomy to manage any intraoperative complications due to her previous heart surgery and multiple comorbidities.  The patient has been advised of a variety of complications that might develop including but not limited to risks of death, stroke, paravalvular leak, aortic dissection or other major vascular complications, aortic annulus rupture, device embolization, cardiac rupture or perforation, mitral regurgitation, acute myocardial infarction, arrhythmia, heart block or bradycardia requiring permanent pacemaker placement, congestive heart failure, respiratory failure, renal failure, pneumonia, infection, other late complications related to structural valve deterioration or migration, or other complications that might ultimately cause a temporary or permanent loss of functional independence or other long term morbidity. The patient provides full informed consent for the procedure as described and all questions were answered.      Plan:  She will be scheduled for transfemoral TAVR using a SAPIEN 3 valve.  I spent 60 minutes performing this consultation and > 50% of this time was spent face to face counseling and coordinating the care of this patient's severe symptomatic aortic stenosis.   Alleen Borne, MD 12/12/2022

## 2022-12-15 ENCOUNTER — Other Ambulatory Visit: Payer: Self-pay

## 2022-12-15 DIAGNOSIS — I350 Nonrheumatic aortic (valve) stenosis: Secondary | ICD-10-CM

## 2022-12-19 ENCOUNTER — Encounter (HOSPITAL_COMMUNITY)
Admission: RE | Admit: 2022-12-19 | Discharge: 2022-12-19 | Disposition: A | Discharge status: Home / Self Care | Payer: Medicare PPO | Source: Ambulatory Visit | Attending: Internal Medicine

## 2022-12-19 ENCOUNTER — Other Ambulatory Visit (HOSPITAL_COMMUNITY): Payer: Medicare PPO

## 2022-12-19 ENCOUNTER — Other Ambulatory Visit: Payer: Self-pay

## 2022-12-19 ENCOUNTER — Ambulatory Visit (HOSPITAL_COMMUNITY)
Admission: RE | Admit: 2022-12-19 | Discharge: 2022-12-19 | Disposition: A | Discharge status: Home / Self Care | Payer: Medicare PPO | Source: Ambulatory Visit | Attending: Internal Medicine | Admitting: Internal Medicine

## 2022-12-19 DIAGNOSIS — Z01818 Encounter for other preprocedural examination: Secondary | ICD-10-CM | POA: Insufficient documentation

## 2022-12-19 DIAGNOSIS — I700 Atherosclerosis of aorta: Secondary | ICD-10-CM | POA: Diagnosis not present

## 2022-12-19 DIAGNOSIS — Z01810 Encounter for preprocedural cardiovascular examination: Secondary | ICD-10-CM | POA: Diagnosis present

## 2022-12-19 DIAGNOSIS — I517 Cardiomegaly: Secondary | ICD-10-CM | POA: Diagnosis not present

## 2022-12-19 DIAGNOSIS — Z1152 Encounter for screening for COVID-19: Secondary | ICD-10-CM | POA: Insufficient documentation

## 2022-12-19 DIAGNOSIS — R9431 Abnormal electrocardiogram [ECG] [EKG]: Secondary | ICD-10-CM | POA: Insufficient documentation

## 2022-12-19 DIAGNOSIS — Z01812 Encounter for preprocedural laboratory examination: Secondary | ICD-10-CM | POA: Diagnosis present

## 2022-12-19 DIAGNOSIS — I350 Nonrheumatic aortic (valve) stenosis: Secondary | ICD-10-CM | POA: Insufficient documentation

## 2022-12-19 LAB — COMPREHENSIVE METABOLIC PANEL
ALT: 6 U/L (ref 0–44)
AST: 10 U/L — ABNORMAL LOW (ref 15–41)
Albumin: 3.4 g/dL — ABNORMAL LOW (ref 3.5–5.0)
Alkaline Phosphatase: 79 U/L (ref 38–126)
Anion gap: 14 (ref 5–15)
BUN: 27 mg/dL — ABNORMAL HIGH (ref 8–23)
CO2: 21 mmol/L — ABNORMAL LOW (ref 22–32)
Calcium: 8.7 mg/dL — ABNORMAL LOW (ref 8.9–10.3)
Chloride: 98 mmol/L (ref 98–111)
Creatinine, Ser: 6.22 mg/dL — ABNORMAL HIGH (ref 0.44–1.00)
GFR, Estimated: 7 mL/min — ABNORMAL LOW (ref >60)
Glucose, Bld: 129 mg/dL — ABNORMAL HIGH (ref 70–99)
Potassium: 3.6 mmol/L (ref 3.5–5.1)
Sodium: 133 mmol/L — ABNORMAL LOW (ref 135–145)
Total Bilirubin: 0.6 mg/dL (ref <1.2)
Total Protein: 6.6 g/dL (ref 6.5–8.1)

## 2022-12-19 LAB — CBC
HCT: 38.4 % (ref 36.0–46.0)
Hemoglobin: 10.8 g/dL — ABNORMAL LOW (ref 12.0–15.0)
MCH: 30.7 pg (ref 26.0–34.0)
MCHC: 28.1 g/dL — ABNORMAL LOW (ref 30.0–36.0)
MCV: 109.1 fL — ABNORMAL HIGH (ref 80.0–100.0)
Platelets: 141 10*3/uL — ABNORMAL LOW (ref 150–400)
RBC: 3.52 MIL/uL — ABNORMAL LOW (ref 3.87–5.11)
RDW: 16 % — ABNORMAL HIGH (ref 11.5–15.5)
WBC: 2.8 10*3/uL — ABNORMAL LOW (ref 4.0–10.5)
nRBC: 0 % (ref 0.0–0.2)

## 2022-12-19 LAB — PROTIME-INR
INR: 1.2 (ref 0.8–1.2)
Prothrombin Time: 15.3 s — ABNORMAL HIGH (ref 11.4–15.2)

## 2022-12-19 LAB — TYPE AND SCREEN
ABO/RH(D): AB POS
Antibody Screen: NEGATIVE

## 2022-12-19 LAB — SURGICAL PCR SCREEN
MRSA, PCR: NEGATIVE
Staphylococcus aureus: NEGATIVE

## 2022-12-19 NOTE — Progress Notes (Signed)
Patient signed all consents at PAT lab appointment. CHG soap and instructions were given to patient. CHG surgical prep reviewed with patient and all questions answered.  Pt says she was started on antibiotic/steroid eye drops for rebound inflammation after her recent cataract surgery. No respiratory illness/infection in the last two months.  She is on 3L of oxygen via nasal cannula at pre-op appointment, which is her normal amount.  UA not collected due to pt being anuric. Cline Crock, RN with TAVR team made aware and order discontinued.   Patients chart send to anesthesia for review.

## 2022-12-20 LAB — SARS CORONAVIRUS 2 (TAT 6-24 HRS): SARS Coronavirus 2: NEGATIVE

## 2022-12-22 MED ORDER — NOREPINEPHRINE 4 MG/250ML-% IV SOLN
0.0000 ug/min | INTRAVENOUS | Status: DC
  Filled 2022-12-22: qty 250

## 2022-12-22 MED ORDER — DEXMEDETOMIDINE HCL IN NACL 400 MCG/100ML IV SOLN
0.1000 ug/kg/h | INTRAVENOUS | Status: AC
  Administered 2022-12-23 (×3): 4 ug via INTRAVENOUS
  Administered 2022-12-23: 8 ug via INTRAVENOUS
  Filled 2022-12-22: qty 100

## 2022-12-22 MED ORDER — MAGNESIUM SULFATE 50 % IJ SOLN
40.0000 meq | INTRAMUSCULAR | Status: DC
  Filled 2022-12-22 (×2): qty 9.85

## 2022-12-22 MED ORDER — HEPARIN 30,000 UNITS/1000 ML (OHS) CELLSAVER SOLUTION
Status: DC
  Filled 2022-12-22 (×2): qty 1000

## 2022-12-22 MED ORDER — POTASSIUM CHLORIDE 2 MEQ/ML IV SOLN
80.0000 meq | INTRAVENOUS | Status: DC
  Filled 2022-12-22 (×2): qty 40

## 2022-12-22 MED ORDER — CEFAZOLIN SODIUM-DEXTROSE 2-4 GM/100ML-% IV SOLN
2.0000 g | INTRAVENOUS | Status: AC
Start: 2022-12-23 — End: 2022-12-24
  Administered 2022-12-23: 2 g via INTRAVENOUS
  Filled 2022-12-22: qty 100

## 2022-12-22 NOTE — H&P (Signed)
[address]       [place] [address]             [phone]      Cardiothoracic Surgery Admission History and Physical   PCP is Jobe, [name]., MD Referring Provider is Alverda Skeans, MD Primary Cardiologist is Donato Schultz, MD   Reason for admission: Critical aortic stenosis   HPI:   The patient is a 62 year old woman with history of type 2 diabetes, hypertension, ESRD on hemodialysis, previous smoking with oxygen dependent COPD, coronary artery disease status post CABG x 2 by me on 09/13/2020 (LIMA to LAD and SVG to PDA), and aortic stenosis who was referred for consideration of aortic valve replacement.  She is here with her daughter.  She reports progressive exertional shortness of breath and fatigue.  She contracted COVID in early 2023 and was placed on oxygen after that.  She had pulmonary function testing showing an FEV1 of 0.91 which was 50% of predicted, and FVC of 1.1 which was 47% of predicted and a diffusion capacity that was 47 % of predicted.  She feels that her shortness of breath has been increasing to the point where she gets short of breath with minimal activity and has a low quality of life.  She has been able to undergo dialysis without difficulty.  She had an echocardiogram on 12/09/2022 showing a bicuspid aortic valve with a mean gradient of 52 mmHg and a valve area of 0.8 cm.  Left ventricular ejection fraction was normal.  There was moderate mitral annular calcification and moderate tricuspid regurgitation with a moderately enlarged right ventricle with moderate RV systolic dysfunction.  PA pressure was felt to be severely elevated.  Her cardiac catheterization in April 2024 showed a patent LIMA to the LAD and an occluded vein graft to the PDA.  Cardiac index was 7.2 with a PA pressure of 70/29 and a mean of 43.  Mean wedge pressure was 27.  Mean right atrial pressure was 14.  She subsequently had a gated cardiac CTA which showed a patent vein graft  to the PDA.     She has had all of her teeth extracted and is planning on having implants done at some point.     Past Medical History:  Diagnosis Date   Anemia of chronic disease     Anxiety     Arthritis      oa and psoriatic ra   Asthma     Back pain, chronic      lower back   Chronic insomnia     COPD (chronic obstructive pulmonary disease) (HCC)     Diabetes mellitus      type 2   ESRD on dialysis [facility])     Essential hypertension     GERD (gastroesophageal reflux disease)     Gout     History of hiatal hernia     Hypoalbuminemia     Hyponatremia     Mild aortic stenosis     Mitral regurgitation     Mitral stenosis     Obesity     PAD (peripheral artery disease) (HCC)      a. externial iliac calcification seen on CT 04/2020.   Sleep apnea     Tobacco abuse     Upper GI bleed 05/2019               Past Surgical History:  Procedure Laterality Date   BACK SURGERY  2016    lower back fusion with cage   BIOPSY   09/23/2017    Procedure: BIOPSY;  Surgeon: Graylin Shiver, MD;  Location: [facility] ENDOSCOPY;  Service: Endoscopy;;   BIOPSY   05/19/2019    Procedure: BIOPSY;  Surgeon: Kathi Der, MD;  Location: [facility] ENDOS[facility];  Service: Gastroenterology;;   CESAREAN SECTION   1990    x 1    COLONOSCOPY WITH PROPOFOL N/A 09/23/2017    Procedure: COLONOSCOPY WITH PROPOFOL Hemostatic clips placed;  Surgeon: Graylin Shiver, MD;  Location: [facility] ENDOS[facility];  Service: Endoscopy;  Laterality: N/A;   COLONOSCOPY WITH PROPOFOL N/A 10/02/2020    Procedure: COLONOSCOPY WITH PROPOFOL;  Surgeon: Willis Modena, MD;  Location: [facility] ENDOSCOPY;  Service: Endoscopy;  Laterality: N/A;   CORONARY ARTERY BYPASS GRAFT N/A 09/13/2020    Procedure: CORONARY ARTERY BYPASS GRAFTING (CABG), ON PUMP, TIMES TWO, USING LEFT INTERNAL MAMMARY ARTERY AND RIGHT ENDOSCOPICALLY HARVESTED GREATER SAPHENOUS VEIN;  Surgeon: Alleen Borne, MD;  Location: [facility] OR;  Service: Open Heart Surgery;  Laterality: N/A;    DILATION AND CURETTAGE OF UTERUS   1988   ENTEROSCOPY N/A 07/22/2021    Procedure: ENTEROSCOPY;  Surgeon: Kerin Salen, MD;  Location: [facility] ENDOSCOPY;  Service: Gastroenterology;  Laterality: N/A;   ESOPHAGOGASTRODUODENOSCOPY N/A 09/22/2020    Procedure: ESOPHAGOGASTRODUODENOSCOPY (EGD);  Surgeon: Willis Modena, MD;  Location: [facility] ENDOSCOPY;  Service: Endoscopy;  Laterality: N/A;   ESOPHAGOGASTRODUODENOSCOPY (EGD) WITH PROPOFOL N/A 09/23/2017    Procedure: ESOPHAGOGASTRODUODENOSCOPY (EGD) WITH PROPOFOL;  Surgeon: Graylin Shiver, MD;  Location: [facility];  Service: Endoscopy;  Laterality: N/A;   ESOPHAGOGASTRODUODENOSCOPY (EGD) WITH PROPOFOL N/A 05/19/2019    Procedure: ESOPHAGOGASTRODUODENOSCOPY (EGD) WITH PROPOFOL;  Surgeon: Kathi Der, MD;  [facility];  Service: Gastroenterology;  Laterality: N/A;   ESOPHAGOGASTRODUODENOSCOPY (EGD) WITH PROPOFOL N/A 10/02/2020    Procedure: ESOPHAGOGASTRODUODENOSCOPY (EGD) WITH PROPOFOL;  Surgeon: Willis Modena, MD;  Location: [facility] ENDOSCOPY;  Service: Endoscopy;  Laterality: N/A;   GIVENS CAPSULE STUDY N/A 05/19/2019    Procedure: GIVENS CAPSULE STUDY;  Surgeon: Kathi Der, MD;  [facility] ENDOS[facility];  Service: Gastroenterology;  Laterality: N/A;   GIVENS CAPSULE STUDY N/A 07/19/2021    Procedure: GIVENS CAPSULE STUDY;  Surgeon: [name], MD;  Location: [facility] ENDOSCOPY;  Service: Gastroenterology;  Laterality: N/A;   HEMOSTASIS CLIP PLACEMENT   09/22/2020    Procedure: HEMOSTASIS CLIP PLACEMENT;  Surgeon: Willis Modena, MD;  Location: [facility];  Service: Endoscopy;;   HEMOSTASIS CONTROL   09/22/2020    Procedure: HEMOSTASIS CONTROL;  Surgeon: Willis Modena, MD;  Location: [facility] ENDOSCOPY;  Service: Endoscopy;;   HOT HEMOSTASIS N/A 05/19/2019    Procedure: HOT HEMOSTASIS (ARGON PLASMA COAGULATION/BICAP);  Surgeon: Kathi Der, MD;  Location: [facility] ENDOSCOPY;  Service: Gastroenterology;  Laterality: N/A;   LEFT HEART CATH AND CORONARY  ANGIOGRAPHY N/A 09/10/2020    Procedure: LEFT HEART CATH AND CORONARY ANGIOGRAPHY;  Surgeon: Yvonne Kendall, MD;  [facility]: [facility] INVASIVE CV LAB;  Service: Cardiovascular;  Laterality: N/A;   PARTIAL KNEE ARTHROPLASTY Left 11/12/2017    Procedure: left unicompartmental arthroplasty-medial;  Surgeon: Durene Romans, MD;  Location: [facility] ORS;  Service: Orthopedics;  Laterality: Left;    RIGHT HEART CATH AND CORONARY/GRAFT ANGIOGRAPHY N/A 06/26/2022    Procedure: RIGHT HEART CATH AND CORONARY/GRAFT ANGIOGRAPHY;  Surgeon: [name], MD;  [facility]: [facility] INVASIVE CV LAB;  Service: Cardiovascular;  Laterality: N/A;   SUBMUCOSAL INJECTION   09/23/2017    Procedure: SUBMUCOSAL INJECTION;  Surgeon:  Graylin Shiver, MD;  Location: [facility] ENDOS[facility];  Service: Endoscopy;;  in colon   TEE WITHOUT CARDIOVERSION N/A 09/13/2020    Procedure: TRANSESOPHAGEAL ECHOCARDIOGRAM (TEE);  Surgeon: Alleen Borne, MD;  Location: [facility] OR;  Service: Open Heart Surgery;  Laterality: N/A;   TUBAL LIGATION   1990               Family History  Problem Relation Age of Onset   Lung cancer Mother     Diabetes Sister     Heart disease Sister     Asthma Daughter     Arthritis Daughter     Arthritis Daughter     Thyroid cancer Other          1/2 sister   Heart disease Other          1/2 sister          Social History         Socioeconomic History   Marital status: Divorced      Spouse name: BASIL   Number of children: 3   Years of education: 1   Highest education level: GED or equivalent  Occupational History   Occupation: retired Public house manager  Tobacco Use   Smoking status: Former      Current packs/day: 0.00      Average packs/day: 0.5 packs/day for 40.0 years (20.0 ttl pk-yrs)      Types: Cigarettes      Start date: 08/1980      Quit date: 08/2020      Years since quitting: 2.3   Smokeless tobacco: Never  Vaping Use   Vaping status: Never Used  Substance and Sexual Activity   Alcohol use: Yes      Comment: occ    Drug use: No   Sexual activity: Not on file  Other Topics Concern   Not on file  Social History Narrative   Not on file    Social Determinants of Health        Financial Resource Strain: Low Risk  (06/12/2022)    Received from [facility]    Overall Financial Resource Strain (CARDIA)     Difficulty of Paying Living Expenses: Not hard at all  Recent Concern: Financial Resource Strain - Medium Risk (04/29/2022)    Received from [facility]    Overall Financial Resource Strain (CARDIA)     Difficulty of Paying Living Expenses: Somewhat hard  Food Insecurity: No Food Insecurity (09/20/2022)    Hunger Vital Sign     Worried About Running Out of Food in the Last Year: Never true     Ran Out of Food in the Last Year: Never true  Transportation Needs: No Transportation Needs (09/20/2022)    PRAPARE - Therapist, art (Medical): No     Lack of Transportation (Non-Medical): No  Physical Activity: Inactive (09/15/2018)    Exercise Vital Sign     Days of Exercise per Week: 0 days     Minutes of Exercise per Session: 0 min  Stress: Stress Concern Present (07/02/2022)    Received from [facility], [facility] - Occupational Stress Questionnaire     Feeling of Stress : To some extent  Social Connections: Unknown (06/23/2021)    Received from [facility], [facility]    Social Network     Social Network: Not on file  Intimate Partner Violence: Not At Risk (11/18/2022)  Received from [facility]    HITS     Over the last 12 months how often did your partner physically hurt you?: 1     Over the last 12 months how often did your partner insult you or talk down to you?: 1     Over the last 12 months how often did your partner threaten you with physical harm?: 1     Over the last 12 months how often did your partner scream or curse at you?: 1             Prior to Admission medications   Medication  Sig Start Date End Date Taking? Authorizing Provider  acetaminophen (TYLENOL) 500 MG tablet Take 1,000 mg by mouth daily as needed (pain).     Yes [provider]  albuterol (PROVENTIL) (2.5 MG/3ML) 0.083% nebulizer solution Take 3 mLs (2.5 mg total) by nebulization every 6 (six) hours as needed for wheezing or shortness of breath. 07/10/21   Yes Hunsucker, Lesia Sago, MD  allopurinol (ZYLOPRIM) 100 MG tablet Take 100 mg by mouth every evening.     Yes [provider]  carboxymethylcellulose 1 % ophthalmic solution Place 1 drop into both eyes 3 (three) times daily.     Yes [provider]  carvedilol (COREG) 25 MG tablet Take 25 mg by mouth 2 (two) times daily with a meal.     Yes [provider]  cetirizine (ZYRTEC) 10 MG tablet Take 10 mg by mouth daily as needed for allergies.     Yes [provider]  Cholecalciferol (VITAMIN D) 50 MCG (2000 UT) tablet Take 2,000 Units by mouth daily.     Yes [provider]  cinacalcet (SENSIPAR) 30 MG tablet Take 60 mg by mouth every Monday, Wednesday, and Friday with hemodialysis.     Yes [provider]  cloNIDine (CATAPRES - DOSED IN MG/24 HR) 0.3 mg/24hr patch Place 0.3 mg onto the skin every Sunday.     Yes [provider]  diclofenac Sodium (VOLTAREN) 1 % GEL Apply 2 g topically daily as needed (pain).     Yes [provider]  docusate sodium (COLACE) 100 MG capsule Take 100 mg by mouth daily.     Yes [provider]  EPINEPHrine 0.3 mg/0.3 mL IJ SOAJ injection Inject into the muscle as needed for anaphylaxis. 07/12/21   Yes [provider]  fluticasone (FLONASE) 50 MCG/ACT nasal spray Place 1 spray into both nostrils daily as needed for allergies or rhinitis.     Yes [provider]  hydrALAZINE (APRESOLINE) 100 MG tablet Take 1 tablet (100 mg total) by mouth every 8 (eight) hours. 07/06/20   Yes Cresenzo, Cyndi Lennert, MD  hydrocortisone (ANUSOL-HC) 2.5 %  rectal cream Place rectally 2 (two) times daily. Patient taking differently: Place 1 Application rectally 2 (two) times daily as needed for anal itching or hemorrhoids. 07/23/21   Yes Park Pope, MD  isosorbide mononitrate (IMDUR) 60 MG 24 hr tablet Take 1 tablet (60 mg total) by mouth daily. 10/27/22   Yes Jake Bathe, MD  LORazepam (ATIVAN) 1 MG tablet Take 1 mg by mouth every 8 (eight) hours as needed for anxiety. 06/25/20   Yes [provider]  montelukast (SINGULAIR) 10 MG tablet Take 10 mg by mouth daily with supper.      Yes [provider]  multivitamin (RENA-VIT) TABS tablet Take 1 tablet by mouth every Monday, Wednesday, and Friday.  Yes [provider]  NIFEdipine (PROCARDIA XL/NIFEDICAL XL) 60 MG 24 hr tablet Take 60 mg by mouth 2 (two) times daily. 10/01/21   Yes [provider]  olmesartan (BENICAR) 40 MG tablet Take 40 mg by mouth every evening. 05/06/19   Yes [provider]  ondansetron (ZOFRAN) 4 MG tablet Take 4 mg by mouth 2 (two) times daily as needed for nausea or vomiting.     Yes [provider]  OXYGEN Inhale 2-3 L into the lungs as directed.     Yes [provider]  pantoprazole (PROTONIX) 40 MG tablet Take 40 mg by mouth 2 (two) times daily. 06/09/20   Yes [provider]  predniSONE (DELTASONE) 5 MG tablet Take 1 tablet (5 mg total) by mouth daily with breakfast. 02/27/22   Yes Marrianne Mood, MD  Probiotic Product (PROBIOTIC PO) Take 1 capsule by mouth daily.     Yes [provider]  silver sulfADIAZINE (SILVADENE) 1 % cream Apply 1 Application topically daily as needed (wound care).     Yes [provider]  Tiotropium Bromide-Olodaterol (STIOLTO RESPIMAT) 2.5-2.5 MCG/ACT AERS Inhale 2 puffs once daily into the lungs. 11/28/21   Yes Hunsucker, Lesia Sago, MD  VENTOLIN HFA 108 (90 Base) MCG/ACT inhaler Inhale 2 puffs into the lungs See admin instructions. Take 2 puffs every 4-6 hours  as needed for wheezing and shortness of breath 04/08/22   Yes Hunsucker, Lesia Sago, MD  White Petrolatum-Mineral Oil (SOOTHE NIGHTTIME) OINT Place 1 Application into both eyes at bedtime.     Yes [provider]  zolpidem (AMBIEN) 5 MG tablet Take 5 mg by mouth at bedtime as needed for sleep. 02/04/21   Yes [provider]            Current Outpatient Medications  Medication Sig Dispense Refill   acetaminophen (TYLENOL) 500 MG tablet Take 1,000 mg by mouth daily as needed (pain).       albuterol (PROVENTIL) (2.5 MG/3ML) 0.083% nebulizer solution Take 3 mLs (2.5 mg total) by nebulization every 6 (six) hours as needed for wheezing or shortness of breath. 75 mL 6   allopurinol (ZYLOPRIM) 100 MG tablet Take 100 mg by mouth every evening.       carboxymethylcellulose 1 % ophthalmic solution Place 1 drop into both eyes 3 (three) times daily.       carvedilol (COREG) 25 MG tablet Take 25 mg by mouth 2 (two) times daily with a meal.       cetirizine (ZYRTEC) 10 MG tablet Take 10 mg by mouth daily as needed for allergies.       Cholecalciferol (VITAMIN D) 50 MCG (2000 UT) tablet Take 2,000 Units by mouth daily.       cinacalcet (SENSIPAR) 30 MG tablet Take 60 mg by mouth every Monday, Wednesday, and Friday with hemodialysis.       cloNIDine (CATAPRES - DOSED IN MG/24 HR) 0.3 mg/24hr patch Place 0.3 mg onto the skin every Sunday.       diclofenac Sodium (VOLTAREN) 1 % GEL Apply 2 g topically daily as needed (pain).       docusate sodium (COLACE) 100 MG capsule Take 100 mg by mouth daily.       EPINEPHrine 0.3 mg/0.3 mL IJ SOAJ injection Inject into the muscle as needed for anaphylaxis.       fluticasone (FLONASE) 50 MCG/ACT nasal spray Place 1 spray into both nostrils daily as needed for allergies or rhinitis.  hydrALAZINE (APRESOLINE) 100 MG tablet Take 1 tablet (100 mg total) by mouth every 8 (eight) hours. 90 tablet 0   hydrocortisone (ANUSOL-HC) 2.5 % rectal cream Place  rectally 2 (two) times daily. (Patient taking differently: Place 1 Application rectally 2 (two) times daily as needed for anal itching or hemorrhoids.) 30 g 0   isosorbide mononitrate (IMDUR) 60 MG 24 hr tablet Take 1 tablet (60 mg total) by mouth daily. 90 tablet 1   LORazepam (ATIVAN) 1 MG tablet Take 1 mg by mouth every 8 (eight) hours as needed for anxiety.       montelukast (SINGULAIR) 10 MG tablet Take 10 mg by mouth daily with supper.        multivitamin (RENA-VIT) TABS tablet Take 1 tablet by mouth every Monday, Wednesday, and Friday.       NIFEdipine (PROCARDIA XL/NIFEDICAL XL) 60 MG 24 hr tablet Take 60 mg by mouth 2 (two) times daily.       olmesartan (BENICAR) 40 MG tablet Take 40 mg by mouth every evening.       ondansetron (ZOFRAN) 4 MG tablet Take 4 mg by mouth 2 (two) times daily as needed for nausea or vomiting.       OXYGEN Inhale 2-3 L into the lungs as directed.       pantoprazole (PROTONIX) 40 MG tablet Take 40 mg by mouth 2 (two) times daily.       predniSONE (DELTASONE) 5 MG tablet Take 1 tablet (5 mg total) by mouth daily with breakfast.       Probiotic Product (PROBIOTIC PO) Take 1 capsule by mouth daily.       silver sulfADIAZINE (SILVADENE) 1 % cream Apply 1 Application topically daily as needed (wound care).       Tiotropium Bromide-Olodaterol (STIOLTO RESPIMAT) 2.5-2.5 MCG/ACT AERS Inhale 2 puffs once daily into the lungs. 4 g 3   VENTOLIN HFA 108 (90 Base) MCG/ACT inhaler Inhale 2 puffs into the lungs See admin instructions. Take 2 puffs every 4-6 hours as needed for wheezing and shortness of breath 18 g 6   White Petrolatum-Mineral Oil (SOOTHE NIGHTTIME) OINT Place 1 Application into both eyes at bedtime.       zolpidem (AMBIEN) 5 MG tablet Take 5 mg by mouth at bedtime as needed for sleep.          No current facility-administered medications for this visit.        Allergies       Allergies  Allergen Reactions   3-Methyl-2-Benzothiazolinone Hydrazone Other  (See Comments)      Muscle weakness muscle cramping in legs Other reaction(s): Myalgias (Muscle Pain)   Banana Anaphylaxis, Swelling and Other (See Comments)      TONGUE AND MOUTH SWELLING   Black Walnut Flavor Anaphylaxis and Itching      Walnuts   Hazelnut (Filbert) Anaphylaxis      Hazelnuts   Leflunomide And Related Other (See Comments)      Severe Headache   Lisinopril Swelling      Angioedema  Swelling of the face   No Healthtouch Food Allergies Anaphylaxis      Spicy Mustard 4.7.2021 Pt reports that she eats Regular Yellow Mustard Swelling and itching of tongue   Other Anaphylaxis, Swelling and Other (See Comments)      Cosentyx= angioedema Mouth itches and tongue swelling Hazel nuts and pecans also Walnuts Renal failure Serotonin syndrome  Tremors Muscle weakness muscle cramping in legs Other reaction(s): Myalgias (Muscle Pain)  Pecan Nut (Diagnostic) Anaphylaxis and Itching      Pecans   Trazodone And Nefazodone Other (See Comments)      Serotonin Syndrome  Tremors   Adalimumab Other (See Comments)      Blisters on abdomen =humira   Escitalopram Oxalate Other (See Comments)      Hand problems - Serotonin Syndrome      Ezetimibe Other (See Comments)      myalgias cramps     Secukinumab Swelling      Cosentyx = angiodema     Statins Other (See Comments)      Leg pains and weakness   Ferrlecit [Na Ferric Gluc Cplx In Sucrose]        Not reaction given from HD   Gabapentin Other (See Comments)      tremors   Nsaids        Avoid due to renal problems             Review of Systems:               General:                      + decreased appetite, + decreased energy, no weight gain, + weight loss, no fever             Cardiac:                       + chest pain with exertion, + chest pain at rest, + SOB with mild exertion, + resting SOB, no PND, + orthopnea, no palpitations, no arrhythmia, no atrial fibrillation, no LE edema, no dizzy spells, no  syncope             Respiratory:                 +  shortness of breath, + home oxygen, no productive cough, no dry cough, no bronchitis, + wheezing, no hemoptysis, no asthma, no pain with inspiration or cough, + sleep apnea, no CPAP at night             GI:                               no difficulty swallowing, + reflux, no frequent heartburn, + hiatal hernia, no abdominal pain, + constipation, no diarrhea, no hematochezia, no hematemesis, + melena             GU:                              no dysuria,  no frequency, no urinary tract infection, no hematuria, no kidney stones, + end stage kidney disease             Vascular:                     no pain suggestive of claudication, no pain in feet, no leg cramps, no varicose veins, no DVT, no non-healing foot ulcer             Neuro:                         no stroke, no TIA's, no seizures, no headaches, no temporary blindness one eye,  no slurred speech, + peripheral neuropathy, + chronic pain,  no instability of gait, no memory/cognitive dysfunction             Musculoskeletal:         + arthritis, no joint swelling, no myalgias, no difficulty walking, normal mobility              Skin:                            no rash, no itching, no skin infections, no pressure sores or ulcerations             Psych:                         + anxiety, no depression, no nervousness, no unusual recent stress             Eyes:                           no blurry vision, no floaters, no recent vision changes, no glasses or contacts             ENT:                            no hearing loss, no loose or painful teeth, no dentures, last saw dentist 6/224             Hematologic:               no easy bruising, no abnormal bleeding, no clotting disorder, no frequent epistaxis             Endocrine:                   + diabetes, does check CBG's at home                            Physical Exam:               Ht 5\' 1"  (1.549 m)   BMI 25.21 kg/m               General:                      Frail-appearing woman in no distress, on oxygen             HEENT:                       Unremarkable, NCAT, PERLA, EOMI             Neck:                           no JVD, + bilateral bruits or transmitted murmur, no adenopathy              Chest:                          clear to auscultation, symmetrical breath sounds, no wheezes, no rhonchi              CV:                              RRR,  3/6 harsh systolic murmur RSB, no diastolic murmur             Abdomen:                    soft, non-tender, no masses              Extremities:                 warm, well-perfused, pedal pulses palpable, no lower extremity edema             Rectal/GU                   Deferred             Neuro:                         Grossly non-focal and symmetrical throughout             Skin:                            Clean and dry, no rashes, no breakdown   Diagnostic Tests:   ECHOCARDIOGRAM REPORT       Patient Name:   Jodi Bell Date of Exam: 12/09/2022  Medical Rec #:  [id number]        Height:       61.0 in  Accession #:    [id number]       Weight:       133.4 lb  Date of Birth:  [birth year]         BSA:          1.590 m  Patient Age:    62 years         BP:           142/54 mmHg  Patient Gender: F                HR:           63 bpm.  Exam Location:  [address]   Procedure: 2D Echo, Cardiac Doppler and Color Doppler   Indications:    I35.0 AS    History:        Patient has prior history of Echocardiogram examinations,  most                 recent 07/17/2021. Prior CABG, ESRD, AS,  Signs/Symptoms:Shortness                 of Breath; Risk Factors:Diabetes, Hypertension and  Dyslipidemia.    Sonographer:   Samule Ohm RDCS  Referring Phys: [id number] [name]     Sonographer Comments: Image acquisition challenging due to respiratory  motion and Patient movement.  IMPRESSIONS     1. Left ventricular ejection fraction, by estimation, is 60 to 65%.  The  left ventricle has normal function. The left ventricle has no regional  wall motion abnormalities. There is severe concentric left ventricular  hypertrophy. Left ventricular diastolic   parameters are consistent with Grade II diastolic dysfunction  (pseudonormalization). Elevated left atrial pressure.   2. Right ventricular systolic function is moderately reduced. The right  ventricular size is moderately enlarged. Mildly increased right  ventricular wall thickness. There is severely elevated pulmonary artery  systolic pressure. The estimated right  ventricular systolic pressure is 71.0 mmHg.   3. Left atrial size was severely dilated.  4. Right atrial size was moderately dilated.   5. The mitral valve is degenerative. Trivial mitral valve regurgitation.  Moderate mitral annular calcification.   6. The tricuspid valve is abnormal. Tricuspid valve regurgitation is  moderate to severe.   7. The aortic valve is bicuspid. Aortic valve regurgitation is not  visualized. Severe aortic valve stenosis. Aortic valve mean gradient  measures 52.0 mmHg. Aortic valve Vmax measures 4.92 m/s. Aortic valve  acceleration time measures 108 msec.   8. The inferior vena cava is dilated in size with <50% respiratory  variability, suggesting right atrial pressure of 15 mmHg.   Comparison(s): A prior study was performed on 07/17/2021. Aortic stenosis  is now severe, tricuspid insufficiency is worse.   FINDINGS   Left Ventricle: Left ventricular ejection fraction, by estimation, is 60  to 65%. The left ventricle has normal function. The left ventricle has no  regional wall motion abnormalities. The left ventricular internal cavity  size was normal in size. There is   severe concentric left ventricular hypertrophy. Left ventricular  diastolic parameters are consistent with Grade II diastolic dysfunction  (pseudonormalization). Elevated left atrial pressure.   Right Ventricle: The right ventricular  size is moderately enlarged. Mildly  increased right ventricular wall thickness. Right ventricular systolic  function is moderately reduced. There is severely elevated pulmonary  artery systolic pressure. The tricuspid   regurgitant velocity is 3.74 m/s, and with an assumed right atrial  pressure of 15 mmHg, the estimated right ventricular systolic pressure is  71.0 mmHg.   Left Atrium: Left atrial size was severely dilated.   Right Atrium: Right atrial size was moderately dilated.   Pericardium: There is no evidence of pericardial effusion.   Mitral Valve: The mitral valve is degenerative in appearance. Moderate  mitral annular calcification. Trivial mitral valve regurgitation.   Tricuspid Valve: The tricuspid valve is abnormal. Tricuspid valve  regurgitation is moderate to severe.   Aortic Valve: Fused left and non coronary cusps (Sievers type 1). The  aortic valve is bicuspid. Aortic valve regurgitation is not visualized.  Severe aortic stenosis is present. Aortic valve mean gradient measures  52.0 mmHg. Aortic valve peak gradient  measures 96.8 mmHg. Aortic valve area, by VTI measures 0.80 cm.   Pulmonic Valve: The pulmonic valve was normal in structure. Pulmonic valve  regurgitation is mild. No evidence of pulmonic stenosis.   Aorta: The aortic root and ascending aorta are structurally normal, with  no evidence of dilitation.   Venous: The inferior vena cava is dilated in size with less than 50%  respiratory variability, suggesting right atrial pressure of 15 mmHg.   IAS/Shunts: No atrial level shunt detected by color flow Doppler.     LEFT VENTRICLE  PLAX 2D  LVIDd:         4.20 cm   Diastology  LVIDs:         2.80 cm   LV e' medial:    7.07 cm/s  LV PW:         1.80 cm   LV E/e' medial:  29.3  LV IVS:        2.00 cm   LV e' lateral:   10.40 cm/s  LVOT diam:     1.90 cm   LV E/e' lateral: 19.9  LV SV:         86  LV SV Index:   54  LVOT Area:     2.84 cm      RIGHT VENTRICLE  RV  S prime:     8.59 cm/s  TAPSE (M-mode): 1.6 cm  RVSP:           64.0 mmHg   LEFT ATRIUM             Index        RIGHT ATRIUM           Index  LA diam:        4.80 cm 3.02 cm/m   RA Pressure: 8.00 mmHg  LA Vol (A2C):   74.0 ml 46.54 ml/m  RA Area:     24.70 cm  LA Vol (A4C):   99.1 ml 62.32 ml/m  RA Volume:   77.00 ml  48.42 ml/m  LA Biplane Vol: 86.9 ml 54.65 ml/m   AORTIC VALVE  AV Area (Vmax):    0.77 cm  AV Area (Vmean):   0.78 cm  AV Area (VTI):     0.80 cm  AV Vmax:           492.00 cm/s  AV Vmean:          331.000 cm/s  AV VTI:            1.075 m  AV Peak Grad:      96.8 mmHg  AV Mean Grad:      52.0 mmHg  LVOT Vmax:         133.00 cm/s  LVOT Vmean:        90.500 cm/s  LVOT VTI:          0.304 m  LVOT/AV VTI ratio: 0.28    AORTA  Ao Root diam: 3.00 cm  Ao Asc diam:  3.20 cm   MITRAL VALVE                TRICUSPID VALVE  MV Area (PHT): 3.51 cm     TR Peak grad:   56.0 mmHg  MV Decel Time: 216 msec     TR Vmax:        374.00 cm/s  MV E velocity: 207.00 cm/s  Estimated RAP:  8.00 mmHg  MV A velocity: 107.00 cm/s  RVSP:           64.0 mmHg  MV E/A ratio:  1.93                              SHUNTS                              Systemic VTI:  0.30 m                              Systemic Diam: 1.90 cm   Rachelle Hora Croitoru MD  Electronically signed by Thurmon Fair MD  Signature Date/Time: 12/09/2022/6:37:52 PM        Final        Physicians   Panel Physicians Referring Physician Case Authorizing Physician  [name], MD (Primary)        Procedures   RIGHT HEART CATH AND CORONARY/GRAFT ANGIOGRAPHY    Conclusion       Prox RCA lesion is 20% stenosed.   Mid RCA lesion is 95% stenosed.   Dist RCA lesion is 100% stenosed.   Mid LAD lesion is 100% stenosed.   Prox Cx to Mid Cx lesion is 40% stenosed.  Non-stenotic Origin lesion.   1.  Patent LIMA to LAD and occluded vein graft to PDA. 2.  Occluded left into descending  artery and occluded mid right coronary artery with moderate disease of mid left circumflex. 3.  Fick cardiac output of 11.7 L/min and Fick cardiac index of 7.2 L/min/m with the following hemodynamics:            Right atrial pressure mean of 14 mmHg            Right ventricular pressure 67/10 with an end-diastolic pressure of 20 mmHg            Pulmonary artery pressure 70/29 with a mean of 43 mmHg            Wedge pressure mean of 27 mmHg            Pulmonary artery pulsatility index of 2.9            Pulmonary vascular resistance of 1.4 Woods units   Recommendation: Continue evaluation for aortic valve intervention; check CBC next week.   ADDENDUM 07/24/22:  TAVR CTA demonstrated patency of vein graft to PDA.  On review of angiographic images, competitive flow is seen in PDA suggesting patency.                 Indications   Nonrheumatic aortic valve stenosis [I35.0 (ICD-10-CM)]    Procedural Details   Technical Details The patient is a 62 year old female with a history of severe symptomatic aortic stenosis, coronary artery disease status post CABG consisting of a LIMA to LAD and vein graft to PDA, end-stage renal disease on hemodialysis, type 2 diabetes, hypertension, hyperlipidemia, and history of GI bleed with iron deficiency anemia was seen in the outpatient setting due to increasing shortness of breath.  She is referred for right heart catheterization, coronary angiography was, and bypass angiography study as a preprocedural evaluation for an aortic valve intervention.  After obtaining consent the patient brought to the cardiac catheterization laboratory and prepped and draped sterile fashion.  Xylocaine was used to anesthetize the right groin and ultrasound was used to gain access to the right common femoral artery.  A 6 French sheath was placed.  Ultrasound was also used to gain access to the right common femoral vein and a 5 French sheath was placed.  Right heart catheterization  performed with a 5 French balloontipped catheter, coronary angiography was performed with 5 [place] JL 4 catheter, 5 [place] JR4 catheter, 6 French IMA catheter, and 6 [place] multipurpose catheter.  After review of the angiogram no interventions were pursued.  We tried to manage the femoral arteriotomy with a Angio-Seal but the Angio-Seal would not advance into the vessel therefore manual pressure will be applied for both sheaths.  There were no acute complications. Estimated blood loss <50 mL.   During this procedure medications were administered to achieve and maintain moderate conscious sedation while the patient's heart rate, blood pressure, and oxygen saturation were continuously monitored and I was present face-to-face 100% of this time.    Medications (Filter: Administrations occurring from 0731 to 0858 on 06/26/22)  important  Continuous medications are totaled by the amount administered until 06/26/22 0858.    Heparin (Porcine) in NaCl 1000-0.9 UT/500ML-% SOLN (mL)  Total volume: 1,000 mL Date/Time Rate/Dose/Volume Action    06/26/22 0759 500 mL Given    0759 500 mL Given    fentaNYL (SUBLIMAZE) injection (mcg)  Total dose: 50 mcg Date/Time Rate/Dose/Volume Action  06/26/22 0746 25 mcg Given    0802 25 mcg Given    midazolam (VERSED) injection (mg)  Total dose: 2 mg Date/Time Rate/Dose/Volume Action    06/26/22 0747 1 mg Given    0802 1 mg Given    lidocaine (PF) (XYLOCAINE) 1 % injection (mL)  Total volume: 10 mL Date/Time Rate/Dose/Volume Action    06/26/22 0756 10 mL Given    iohexol (OMNIPAQUE) 350 MG/ML injection (mL)  Total volume: 85 mL Date/Time Rate/Dose/Volume Action    06/26/22 0849 85 mL Given      Sedation Time   Sedation Time Physician-1: 58 minutes 22 seconds Contrast        Administrations occurring from 0731 to 0858 on 06/26/22:  Medication Name Total Dose  iohexol (OMNIPAQUE) 350 MG/ML injection 85 mL    Radiation/Fluoro   Fluoro time: 14.9  (min) DAP: 17817 (mGycm2) Cumulative Air Kerma: 253 (mGy) Complications   Complications documented before study signed (07/24/2022  2:02 PM)    No complications were associated with this study.  Documented by Lidia Collum, RCIS - 06/26/2022  7:30 AM      Coronary Findings   Diagnostic Dominance: Right Left Main  Vessel is large. Vessel is angiographically normal.    Left Anterior Descending  Vessel is large.  Mid LAD lesion is 100% stenosed.    First Diagonal Branch  Vessel is moderate in size.    Second Diagonal Branch  Vessel is moderate in size.    Third Diagonal Branch  Vessel is small in size.    Left Circumflex  Vessel is large. There is mild diffuse disease throughout the vessel.  Prox Cx to Mid Cx lesion is 40% stenosed.    First Obtuse Marginal Branch  Vessel is small in size.    Second Obtuse Marginal Branch  Vessel is large in size.    Right Coronary Artery  Vessel is large. The vessel is calcified. The vessel is mildly tortuous.  Prox RCA lesion is 20% stenosed. The lesion is eccentric. The lesion is moderately calcified.  Mid RCA lesion is 95% stenosed. The lesion is eccentric. The lesion is severely calcified.  Dist RCA lesion is 100% stenosed. The lesion is eccentric. The lesion is calcified.    Right Posterior Descending Artery  Vessel is large in size.    Right Posterior Atrioventricular Artery  Vessel is large in size.    First Right Posterolateral Branch  Vessel is small in size.    Second Right Posterolateral Branch  Vessel is large in size.    Third Right Posterolateral Branch  Vessel is small in size.    Graft To Dist RCA  Non-stenotic Origin lesion.    LIMA Graft To Dist LAD    Intervention    No interventions have been documented.    Coronary Diagrams   Diagnostic Dominance: Right  Intervention    Implants    Vascular Products  Device Wire Angioseal 55fr [phone] - [employer]  Inventory item: DEVICE Arlean Hopping 6FR Model/Cat number: [id number]  Manufacturer: Birdie Riddle Lot number: [id number]  Device identifier: [id number] Device identifier type: GS1  GUDID Information         Request status Successful      Brand name: Zara Council Version/Model: [id number]  Company name: [facility] [employer] MRI safety info as of 06/26/22: MR Safe  Contains dry or latex rubber: No      GMDN P.T. name: Femoral artery closure plug/patch, collagen  As of 06/26/2022   Status: [employer]    Show images for CARDIAC CATHETERIZATION Images on Long Term Storage    Show images for Sadieann, [place] to Procedure Log   Procedure Log    Link to Procedure Log   Procedure Log    Hemo Data   Flowsheet Row Most Recent Value  Fick Cardiac Output 11.76 L/min  Fick Cardiac Output Index 7.28 (L/min)/BSA  RA A Wave 17 mmHg  RA V Wave 16 mmHg  RA Mean 14 mmHg  RV Systolic Pressure 67 mmHg  RV Diastolic Pressure 10 mmHg  RV EDP 20 mmHg  PA Systolic Pressure 70 mmHg  PA Diastolic Pressure 29 mmHg  PA Mean 43 mmHg  PW A Wave 27 mmHg  PW V Wave 31 mmHg  PW Mean 27 mmHg  AO Systolic Pressure 131 mmHg  AO Diastolic Pressure 61 mmHg  AO Mean 93 mmHg  QP/QS 0.85  TPVR Index 6.95 HRUI  TSVR Index 12.77 HRUI  PVR SVR Ratio 0.24  TPVR/TSVR Ratio 0.54    Narrative & Impression  CLINICAL DATA:  Aortic Stenosis   EXAM: Cardiac TAVR CT   TECHNIQUE: The patient was scanned on a Siemens Force 192 slice scanner. A 120 kV retrospective scan was triggered in the ascending thoracic aorta at 140 HU's. Gantry rotation speed was 250 msecs and collimation was .6 mm. No beta blockade or nitro were given. The 3D data set was reconstructed in 5% intervals of the R-R cycle. Systolic and diastolic phases were analyzed on a dedicated work station using MPR, MIP and VRT modes. The patient received 80 cc of contrast.   FINDINGS: Aortic Valve: Tri leaflet calcified with restricted  leaflet motion Calcium score 2378   Aorta: Moderate calcific atherosclerosis normal arch vessels   Sino-tubular Junction: 26 mm   Ascending Thoracic Aorta: 37 mm   Aortic Arch: 23 mm   Descending Thoracic Aorta: 21 mm   Sinus of Valsalva Measurements:   Non-coronary: 30.4 mm  Height 18 mm   Right - coronary: 26.5 mm  Height 22.0 mm   Left -   coronary: 30.0 mm  Height 24 mm   Coronary Artery Height above Annulus:   Left Main: 18.2 mm above annulus   Right Coronary: 16.2 mm above annulus   Virtual Basal Annulus Measurements:   Maximum / Minimum Diameter: 23.1 mm x 20.8 mm   Perimeter: 74.7 mm   Area: 414 mm2   Approximately 50% of the annulus has dense calcification Measures as thick as 5.7 mm I believe this makes patient high risk for PVL and annular disruption with TAVR   Also makes it difficult to measure the annulus   Coronary Arteries: Patent LIMA to LAD Patent SVG to RCA Heart cath suggested occluded SVG to RCA but patent Review of angiogram also shows competitive flow to the distal native RCA   Optimum Fluoroscopic Angle for Delivery: LAO 7 Caudal 5 degrees   IMPRESSION: 1. Calcified tri leaflet AV with score 2378   2. Annular area difficult to measure. Aortic annulus with dense calcium approximately 50% of its perimeter. Calcium as thick as 5.7 mm Derived area 414 mm2 would be suitable for 23 mm Sapien 3 valve However suspect patient is high risk for PVL and annular disruption   3. Coronary arteries sufficient height above annulus for deployment Patent LIMA to LAD The SVG to the RCA is patent and  not occluded as suggested on heart cath   4.  Optimum angiographic angle for deployment LAO 7 Caudal 5 degrees   5.  Short membranous septal length 4.04 mm   Charlton Haws   Electronically Signed: By: Charlton Haws M.D. On: 07/24/2022 12:27        Narrative & Impression  CLINICAL DATA:  Aortic valve replacement preop evaluation   EXAM: CT  ANGIOGRAPHY CHEST, ABDOMEN AND PELVIS   TECHNIQUE: Non-contrast CT of the chest was initially obtained.   Multidetector CT imaging through the chest, abdomen and pelvis was performed using the standard protocol during bolus administration of intravenous contrast. Multiplanar reconstructed images and MIPs were obtained and reviewed to evaluate the vascular anatomy.   RADIATION DOSE REDUCTION: This exam was performed according to the departmental dose-optimization program which includes automated exposure control, adjustment of the mA and/or kV according to patient size and/or use of iterative reconstruction technique.   CONTRAST:  OMNIPAQUE IOHEXOL 350 MG/ML SOLN   COMPARISON:  High-resolution chest CT dated August 27, 2021   FINDINGS: CTA CHEST FINDINGS   Cardiovascular: Cardiomegaly. No pericardial effusion. Aortic valve thickening calcifications. Calcifications of the left ventricular outflow tract. Mitral annular calcifications. Normal caliber thoracic aorta with moderate atherosclerotic disease. Severe main and three-vessel coronary artery calcifications status post CABG.   Mediastinum/Nodes: Small hiatal hernia. Thyroid is unremarkable. Mildly enlarged mediastinal lymph nodes likely reactive, reference subcarinal lymph node measuring 13 mm in short axis on series 7, image 60.   Lungs/Pleura: Central airways are patent. Trace right-greater-than-left pleural effusions and bibasilar atelectasis. Stable right lower lobe pulmonary nodule measuring 4 mm on series 8, image 77. Stable subpleural solid nodule of the left upper lobe measuring 4 mm on image 44. No specific follow-up is necessary for pulmonary nodules given greater than 1 year stability.   Musculoskeletal: Diffuse osseous sclerosis. Orthopedic hardware of the cervical spine. Prior median sternotomy.   CTA ABDOMEN AND PELVIS FINDINGS   Hepatobiliary: Suspicious focal liver abnormality. Mild gallbladder wall  thickening. No biliary ductal dilation.   Pancreas: Unremarkable. No pancreatic ductal dilatation or surrounding inflammatory changes.   Spleen: Normal in size without focal abnormality.   Adrenals/Urinary Tract: Bilateral adrenal glands are unremarkable. Hydronephrosis. Numerous bilateral low-attenuation renal lesions, largest are compatible with simple cysts, others are too small to accurately characterize, no specific follow-up imaging is recommended. Bladder is decompressed.   Stomach/Bowel: Wall thickening of the distal stomach. Diverticulosis. No evidence of bowel wall thickening, distention, or inflammatory changes.   Vascular/lymphatic: Normal caliber abdominal aorta with moderate atherosclerotic disease   Reproductive: Uterus and bilateral adnexa are unremarkable.   Other: Small volume abdominopelvic ascites.   Musculoskeletal: Diffuse body wall edema. Diffuse osseous sclerosis. No aggressive appearing osseous lesions. Posterior fusion of L3-L4.   VASCULAR MEASUREMENTS PERTINENT TO TAVR:   AORTA:   Minimal Aortic Diameter-13.5 mm   Severity of Aortic Calcification-moderate   RIGHT PELVIS:   Right Common Iliac Artery -   Minimal Diameter-9.2 mm   Tortuosity-mild   Calcification-moderate   Right External Iliac Artery -   Minimal Diameter-9.0 mm   Tortuosity-mild   Calcification-none   Right Common Femoral Artery -   Minimal Diameter-8.4 mm   Tortuosity-none   Calcification-mild   LEFT PELVIS:   Left Common Iliac Artery -   Minimal Diameter-9.3 mm   Tortuosity-mild   Calcification-moderate   Left External Iliac Artery -   Minimal Diameter-9.0 mm   Tortuosity-mild   Calcification-none   Left Common  Femoral Artery -   Minimal Diameter-8.1 mm   Tortuosity-none   Calcification-none   Review of the MIP images confirms the above findings.   IMPRESSION: Vascular:   1. Vascular findings and measurements pertinent to potential  TAVR procedure, as detailed above. 2. Thickening and calcification of the aortic valve, compatible with reported clinical history of aortic stenosis. 3. Moderate aortoiliac atherosclerosis. Severe coronary artery calcifications status post CABG.   Nonvascular:   1. Small volume abdominopelvic ascites. 2. 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 3. Trace bilateral pleural effusions and bibasilar atelectasis. 4. Mild gallbladder wall thickening, likely related to cardiac dysfunction. 5. Wall thickening of the distal stomach could be due to underdistention or gastritis. If there is clinical concern, endoscopy could be performed for better evaluation.     Electronically Signed   By: Allegra Lai M.D.   On: 07/24/2022 12:18      Impression:   This 62 year old woman has stage D, severe, symptomatic aortic stenosis with NYHA class III symptoms of exertional fatigue and shortness of breath consistent with chronic diastolic congestive heart failure.  I have personally reviewed her 2D echo, cardiac catheterization, and CTA studies.  Her echo shows a bicuspid aortic valve with heavy annular calcification with a mean gradient of 52 mmHg and a valve area by VTI of 0.8 cm consistent with severe aortic stenosis.  Left ventricular ejection fraction is normal.  There is moderate tricuspid regurgitation with moderate RV systolic dysfunction and RV dilation.  Cardiac catheterization showed severe native coronary disease with a patent LIMA to the LAD but the saphenous vein graft to the PDA could not be visualized.  This was noted to be patent on her gated cardiac CTA.  She has severe pulmonary hypertension.  I think aortic valve replacement is the best treatment to try to improve her symptoms and prevent progressive biventricular dysfunction.  Hopefully replacing her aortic valve would bring down her pulmonary artery pressures and reduce the pressure overload of the right side and  the tricuspid regurgitation.  I do not think she is a candidate for open surgical aortic valve replacement given her previous heart surgery, end-stage renal disease on hemodialysis, oxygen dependent lung disease, and other comorbidities.  I think transcatheter aortic valve replacement would be a reasonable alternative.  Her measurements are suitable for a 23 mm SAPIEN 3 valve.  She does have extensive annular and subannular calcium along the left and right cusps which could increase the risk of paravalvular leak and annular rupture.  I reviewed the CT and echo images with her and her family and answered all of their questions.  Her abdominal and pelvic CTA shows adequate pelvic vascular anatomy to allow transfemoral insertion.   The patient and her daughter were counseled at length regarding treatment alternatives for management of severe symptomatic aortic stenosis. The risks and benefits of surgical intervention has been discussed in detail. Long-term prognosis with medical therapy was discussed. Alternative approaches such as conventional surgical aortic valve replacement, transcatheter aortic valve replacement, and palliative medical therapy were compared and contrasted at length. This discussion was placed in the context of the patient's own specific clinical presentation and past medical history. All of their questions have been addressed.    Following the decision to proceed with transcatheter aortic valve replacement, a discussion was held regarding what types of management strategies would be attempted intraoperatively in the event of life-threatening complications.  I do not think she is a candidate for emergent sternotomy to  manage any intraoperative complications due to her previous heart surgery and multiple comorbidities.  The patient has been advised of a variety of complications that might develop including but not limited to risks of death, stroke, paravalvular leak, aortic dissection or other  major vascular complications, aortic annulus rupture, device embolization, cardiac rupture or perforation, mitral regurgitation, acute myocardial infarction, arrhythmia, heart block or bradycardia requiring permanent pacemaker placement, congestive heart failure, respiratory failure, renal failure, pneumonia, infection, other late complications related to structural valve deterioration or migration, or other complications that might ultimately cause a temporary or permanent loss of functional independence or other long term morbidity. The patient provides full informed consent for the procedure as described and all questions were answered.       Plan:   Transfemoral TAVR using a SAPIEN 3 valve.        Alleen Borne, MD

## 2022-12-23 ENCOUNTER — Inpatient Hospital Stay (HOSPITAL_COMMUNITY)
Admission: RE | Disposition: A | Discharge status: Home / Self Care | Payer: Self-pay | Source: Home / Self Care | Attending: Internal Medicine

## 2022-12-23 ENCOUNTER — Other Ambulatory Visit: Payer: Self-pay

## 2022-12-23 ENCOUNTER — Inpatient Hospital Stay (HOSPITAL_COMMUNITY): Payer: Medicare PPO

## 2022-12-23 ENCOUNTER — Encounter (HOSPITAL_COMMUNITY): Payer: Self-pay | Admitting: Internal Medicine

## 2022-12-23 ENCOUNTER — Inpatient Hospital Stay (HOSPITAL_COMMUNITY)
Admission: RE | Admit: 2022-12-23 | Discharge: 2022-12-25 | DRG: 266 | Disposition: A | Discharge status: Home / Self Care | Payer: Medicare PPO | Attending: Internal Medicine | Admitting: Internal Medicine

## 2022-12-23 ENCOUNTER — Inpatient Hospital Stay (HOSPITAL_COMMUNITY): Payer: Medicare PPO | Admitting: Physician Assistant

## 2022-12-23 ENCOUNTER — Inpatient Hospital Stay (HOSPITAL_COMMUNITY): Payer: Medicare PPO | Admitting: Anesthesiology

## 2022-12-23 ENCOUNTER — Other Ambulatory Visit: Payer: Self-pay | Admitting: Physician Assistant

## 2022-12-23 DIAGNOSIS — Z9981 Dependence on supplemental oxygen: Secondary | ICD-10-CM | POA: Diagnosis not present

## 2022-12-23 DIAGNOSIS — Z79899 Other long term (current) drug therapy: Secondary | ICD-10-CM

## 2022-12-23 DIAGNOSIS — I132 Hypertensive heart and chronic kidney disease with heart failure and with stage 5 chronic kidney disease, or end stage renal disease: Secondary | ICD-10-CM | POA: Diagnosis present

## 2022-12-23 DIAGNOSIS — I5033 Acute on chronic diastolic (congestive) heart failure: Secondary | ICD-10-CM | POA: Diagnosis present

## 2022-12-23 DIAGNOSIS — Z7982 Long term (current) use of aspirin: Secondary | ICD-10-CM

## 2022-12-23 DIAGNOSIS — Z952 Presence of prosthetic heart valve: Secondary | ICD-10-CM

## 2022-12-23 DIAGNOSIS — E1151 Type 2 diabetes mellitus with diabetic peripheral angiopathy without gangrene: Secondary | ICD-10-CM | POA: Diagnosis present

## 2022-12-23 DIAGNOSIS — D631 Anemia in chronic kidney disease: Secondary | ICD-10-CM | POA: Diagnosis present

## 2022-12-23 DIAGNOSIS — I083 Combined rheumatic disorders of mitral, aortic and tricuspid valves: Principal | ICD-10-CM | POA: Diagnosis present

## 2022-12-23 DIAGNOSIS — G4730 Sleep apnea, unspecified: Secondary | ICD-10-CM | POA: Diagnosis present

## 2022-12-23 DIAGNOSIS — Z794 Long term (current) use of insulin: Secondary | ICD-10-CM | POA: Diagnosis not present

## 2022-12-23 DIAGNOSIS — J449 Chronic obstructive pulmonary disease, unspecified: Secondary | ICD-10-CM | POA: Diagnosis present

## 2022-12-23 DIAGNOSIS — I447 Left bundle-branch block, unspecified: Secondary | ICD-10-CM | POA: Diagnosis not present

## 2022-12-23 DIAGNOSIS — Z7952 Long term (current) use of systemic steroids: Secondary | ICD-10-CM

## 2022-12-23 DIAGNOSIS — Z91018 Allergy to other foods: Secondary | ICD-10-CM

## 2022-12-23 DIAGNOSIS — I272 Pulmonary hypertension, unspecified: Secondary | ICD-10-CM | POA: Diagnosis present

## 2022-12-23 DIAGNOSIS — Z951 Presence of aortocoronary bypass graft: Secondary | ICD-10-CM

## 2022-12-23 DIAGNOSIS — N186 End stage renal disease: Secondary | ICD-10-CM | POA: Diagnosis present

## 2022-12-23 DIAGNOSIS — J9610 Chronic respiratory failure, unspecified whether with hypoxia or hypercapnia: Secondary | ICD-10-CM | POA: Diagnosis present

## 2022-12-23 DIAGNOSIS — Z981 Arthrodesis status: Secondary | ICD-10-CM

## 2022-12-23 DIAGNOSIS — I2510 Atherosclerotic heart disease of native coronary artery without angina pectoris: Secondary | ICD-10-CM | POA: Diagnosis present

## 2022-12-23 DIAGNOSIS — Z96652 Presence of left artificial knee joint: Secondary | ICD-10-CM | POA: Diagnosis present

## 2022-12-23 DIAGNOSIS — Z808 Family history of malignant neoplasm of other organs or systems: Secondary | ICD-10-CM

## 2022-12-23 DIAGNOSIS — Z888 Allergy status to other drugs, medicaments and biological substances status: Secondary | ICD-10-CM

## 2022-12-23 DIAGNOSIS — Z992 Dependence on renal dialysis: Secondary | ICD-10-CM | POA: Diagnosis not present

## 2022-12-23 DIAGNOSIS — Z8616 Personal history of COVID-19: Secondary | ICD-10-CM | POA: Diagnosis not present

## 2022-12-23 DIAGNOSIS — E119 Type 2 diabetes mellitus without complications: Secondary | ICD-10-CM

## 2022-12-23 DIAGNOSIS — K219 Gastro-esophageal reflux disease without esophagitis: Secondary | ICD-10-CM | POA: Diagnosis present

## 2022-12-23 DIAGNOSIS — Z5986 Financial insecurity: Secondary | ICD-10-CM

## 2022-12-23 DIAGNOSIS — Z801 Family history of malignant neoplasm of trachea, bronchus and lung: Secondary | ICD-10-CM

## 2022-12-23 DIAGNOSIS — E1122 Type 2 diabetes mellitus with diabetic chronic kidney disease: Secondary | ICD-10-CM | POA: Diagnosis present

## 2022-12-23 DIAGNOSIS — F419 Anxiety disorder, unspecified: Secondary | ICD-10-CM | POA: Diagnosis present

## 2022-12-23 DIAGNOSIS — I4891 Unspecified atrial fibrillation: Secondary | ICD-10-CM | POA: Diagnosis present

## 2022-12-23 DIAGNOSIS — Z006 Encounter for examination for normal comparison and control in clinical research program: Secondary | ICD-10-CM | POA: Diagnosis not present

## 2022-12-23 DIAGNOSIS — I350 Nonrheumatic aortic (valve) stenosis: Secondary | ICD-10-CM

## 2022-12-23 DIAGNOSIS — I5032 Chronic diastolic (congestive) heart failure: Secondary | ICD-10-CM | POA: Diagnosis present

## 2022-12-23 DIAGNOSIS — L299 Pruritus, unspecified: Secondary | ICD-10-CM | POA: Diagnosis not present

## 2022-12-23 DIAGNOSIS — J4489 Other specified chronic obstructive pulmonary disease: Secondary | ICD-10-CM | POA: Diagnosis present

## 2022-12-23 DIAGNOSIS — Z87891 Personal history of nicotine dependence: Secondary | ICD-10-CM

## 2022-12-23 DIAGNOSIS — I10 Essential (primary) hypertension: Secondary | ICD-10-CM | POA: Diagnosis present

## 2022-12-23 DIAGNOSIS — I480 Paroxysmal atrial fibrillation: Secondary | ICD-10-CM | POA: Diagnosis present

## 2022-12-23 DIAGNOSIS — Z833 Family history of diabetes mellitus: Secondary | ICD-10-CM

## 2022-12-23 DIAGNOSIS — Z8249 Family history of ischemic heart disease and other diseases of the circulatory system: Secondary | ICD-10-CM

## 2022-12-23 DIAGNOSIS — I252 Old myocardial infarction: Secondary | ICD-10-CM

## 2022-12-23 DIAGNOSIS — Z825 Family history of asthma and other chronic lower respiratory diseases: Secondary | ICD-10-CM

## 2022-12-23 HISTORY — PX: INTRAOPERATIVE TRANSTHORACIC ECHOCARDIOGRAM: SHX6523

## 2022-12-23 HISTORY — DX: Presence of prosthetic heart valve: Z95.2

## 2022-12-23 LAB — POCT I-STAT, CHEM 8
BUN: 27 mg/dL — ABNORMAL HIGH (ref 8–23)
BUN: 30 mg/dL — ABNORMAL HIGH (ref 8–23)
Calcium, Ion: 1.24 mmol/L (ref 1.15–1.40)
Calcium, Ion: 1.03 mmol/L — ABNORMAL LOW (ref 1.15–1.40)
Chloride: 100 mmol/L (ref 98–111)
Chloride: 105 mmol/L (ref 98–111)
Creatinine, Ser: 4.6 mg/dL — ABNORMAL HIGH (ref 0.44–1.00)
Creatinine, Ser: 3.6 mg/dL — ABNORMAL HIGH (ref 0.44–1.00)
Glucose, Bld: 79 mg/dL (ref 70–99)
Glucose, Bld: 78 mg/dL (ref 70–99)
HCT: 41 % (ref 36.0–46.0)
HCT: 29 % — ABNORMAL LOW (ref 36.0–46.0)
Hemoglobin: 13.9 g/dL (ref 12.0–15.0)
Hemoglobin: 9.9 g/dL — ABNORMAL LOW (ref 12.0–15.0)
Potassium: 3.7 mmol/L (ref 3.5–5.1)
Potassium: 3.5 mmol/L (ref 3.5–5.1)
Sodium: 137 mmol/L (ref 135–145)
Sodium: 141 mmol/L (ref 135–145)
TCO2: 30 mmol/L (ref 22–32)
TCO2: 24 mmol/L (ref 22–32)

## 2022-12-23 LAB — ECHOCARDIOGRAM LIMITED
AR max vel: 1.85 cm2
AV Area VTI: 1.95 cm2
AV Area mean vel: 1.89 cm2
AV Mean grad: 15.3 mm[Hg]
AV Peak grad: 30.8 mm[Hg]
Ao pk vel: 2.77 m/s
S' Lateral: 2.8 cm

## 2022-12-23 LAB — GLUCOSE, CAPILLARY
Glucose-Capillary: 88 mg/dL (ref 70–99)
Glucose-Capillary: 70 mg/dL (ref 70–99)

## 2022-12-23 LAB — HEPATITIS B SURFACE ANTIGEN: Hepatitis B Surface Ag: NONREACTIVE

## 2022-12-23 SURGERY — TRANSCATHETER AORTIC VALVE REPLACEMENT, TRANSFEMORAL (CATHLAB)
Anesthesia: Monitor Anesthesia Care

## 2022-12-23 MED ORDER — LIDOCAINE HCL (PF) 1 % IJ SOLN
INTRAMUSCULAR | Status: AC
  Filled 2022-12-23: qty 30

## 2022-12-23 MED ORDER — FENTANYL CITRATE (PF) 100 MCG/2ML IJ SOLN
INTRAMUSCULAR | Status: AC
  Filled 2022-12-23: qty 2

## 2022-12-23 MED ORDER — HEPARIN SODIUM (PORCINE) 1000 UNIT/ML IJ SOLN
INTRAMUSCULAR | Status: DC | PRN
  Administered 2022-12-23: 3000 [IU] via INTRAVENOUS
  Administered 2022-12-23: 5000 [IU] via INTRAVENOUS
  Administered 2022-12-23: 2000 [IU] via INTRAVENOUS

## 2022-12-23 MED ORDER — DIPHENHYDRAMINE HCL 50 MG/ML IJ SOLN
25.0000 mg | Freq: Once | INTRAMUSCULAR | Status: AC
  Administered 2022-12-23: 25 mg via INTRAVENOUS

## 2022-12-23 MED ORDER — LORATADINE 10 MG PO TABS
10.0000 mg | ORAL_TABLET | Freq: Every day | ORAL | Status: DC
  Administered 2022-12-25: 10 mg via ORAL
  Filled 2022-12-23 (×3): qty 1

## 2022-12-23 MED ORDER — MORPHINE SULFATE (PF) 2 MG/ML IV SOLN
1.0000 mg | INTRAVENOUS | Status: DC | PRN
  Administered 2022-12-23 – 2022-12-24 (×2): 2 mg via INTRAVENOUS
  Filled 2022-12-23 (×2): qty 1

## 2022-12-23 MED ORDER — CHLORHEXIDINE GLUCONATE CLOTH 2 % EX PADS
6.0000 | MEDICATED_PAD | Freq: Every day | CUTANEOUS | Status: DC
  Administered 2022-12-24 – 2022-12-25 (×2): 6 via TOPICAL

## 2022-12-23 MED ORDER — DIPHENHYDRAMINE HCL 50 MG/ML IJ SOLN
INTRAMUSCULAR | Status: AC
  Filled 2022-12-23: qty 1

## 2022-12-23 MED ORDER — DOCUSATE SODIUM 100 MG PO CAPS
100.0000 mg | ORAL_CAPSULE | Freq: Every day | ORAL | Status: DC
  Administered 2022-12-23 – 2022-12-25 (×3): 100 mg via ORAL
  Filled 2022-12-23 (×3): qty 1

## 2022-12-23 MED ORDER — LIDOCAINE HCL (PF) 1 % IJ SOLN
INTRAMUSCULAR | Status: DC | PRN
  Administered 2022-12-23 (×2): 10 mL

## 2022-12-23 MED ORDER — TRAMADOL HCL 50 MG PO TABS: 50.0000 mg | ORAL_TABLET | ORAL | Status: DC | PRN

## 2022-12-23 MED ORDER — SODIUM CHLORIDE 0.9 % IV SOLN: INTRAVENOUS | Status: DC

## 2022-12-23 MED ORDER — HEPARIN (PORCINE) IN NACL 1000-0.9 UT/500ML-% IV SOLN
INTRAVENOUS | Status: DC | PRN
  Administered 2022-12-23 (×2): 500 mL

## 2022-12-23 MED ORDER — NIFEDIPINE ER OSMOTIC RELEASE 60 MG PO TB24
60.0000 mg | ORAL_TABLET | Freq: Two times a day (BID) | ORAL | Status: DC
  Administered 2022-12-23 – 2022-12-25 (×4): 60 mg via ORAL
  Filled 2022-12-23 (×4): qty 1

## 2022-12-23 MED ORDER — SODIUM CHLORIDE 0.9% FLUSH: 3.0000 mL | INTRAVENOUS | Status: DC | PRN

## 2022-12-23 MED ORDER — LORAZEPAM 0.5 MG PO TABS
ORAL_TABLET | ORAL | Status: AC
  Filled 2022-12-23: qty 2

## 2022-12-23 MED ORDER — IRBESARTAN 300 MG PO TABS
300.0000 mg | ORAL_TABLET | Freq: Every day | ORAL | Status: DC
  Administered 2022-12-23 – 2022-12-25 (×3): 300 mg via ORAL
  Filled 2022-12-23 (×3): qty 1

## 2022-12-23 MED ORDER — CEFAZOLIN SODIUM-DEXTROSE 2-4 GM/100ML-% IV SOLN
2.0000 g | Freq: Three times a day (TID) | INTRAVENOUS | Status: AC
  Administered 2022-12-23 – 2022-12-24 (×2): 2 g via INTRAVENOUS
  Filled 2022-12-23 (×2): qty 100

## 2022-12-23 MED ORDER — CHLORHEXIDINE GLUCONATE 4 % EX SOLN: 60.0000 mL | Freq: Once | CUTANEOUS | Status: DC

## 2022-12-23 MED ORDER — MONTELUKAST SODIUM 10 MG PO TABS: 10.0000 mg | ORAL_TABLET | Freq: Every day | ORAL | Status: DC

## 2022-12-23 MED ORDER — PREDNISONE 5 MG PO TABS
5.0000 mg | ORAL_TABLET | Freq: Every day | ORAL | Status: DC
  Administered 2022-12-24 – 2022-12-25 (×2): 5 mg via ORAL
  Filled 2022-12-23 (×2): qty 1

## 2022-12-23 MED ORDER — ONDANSETRON HCL 4 MG/2ML IJ SOLN
INTRAMUSCULAR | Status: DC | PRN
  Administered 2022-12-23: 4 mg via INTRAVENOUS

## 2022-12-23 MED ORDER — CHLORHEXIDINE GLUCONATE 0.12 % MT SOLN
15.0000 mL | Freq: Once | OROMUCOSAL | Status: AC
Start: 2022-12-23 — End: 2022-12-23
  Administered 2022-12-23: 15 mL via OROMUCOSAL
  Filled 2022-12-23: qty 15

## 2022-12-23 MED ORDER — SODIUM CHLORIDE 0.9% FLUSH
3.0000 mL | Freq: Two times a day (BID) | INTRAVENOUS | Status: DC
  Administered 2022-12-23 – 2022-12-24 (×3): 3 mL via INTRAVENOUS

## 2022-12-23 MED ORDER — PROTAMINE SULFATE 10 MG/ML IV SOLN
INTRAVENOUS | Status: DC | PRN
  Administered 2022-12-23: 10 mg via INTRAVENOUS
  Administered 2022-12-23: 30 mg via INTRAVENOUS

## 2022-12-23 MED ORDER — SODIUM CHLORIDE 0.9 % IV SOLN
INTRAVENOUS | Status: DC
Start: 2022-12-23 — End: 2022-12-23

## 2022-12-23 MED ORDER — PHENYLEPHRINE HCL-NACL 20-0.9 MG/250ML-% IV SOLN
INTRAVENOUS | Status: DC | PRN
  Administered 2022-12-23: 20 ug/min via INTRAVENOUS

## 2022-12-23 MED ORDER — ACETAMINOPHEN 650 MG RE SUPP: 650.0000 mg | Freq: Four times a day (QID) | RECTAL | Status: DC | PRN

## 2022-12-23 MED ORDER — FENTANYL CITRATE (PF) 100 MCG/2ML IJ SOLN
INTRAMUSCULAR | Status: DC | PRN
  Administered 2022-12-23 (×2): 25 ug via INTRAVENOUS

## 2022-12-23 MED ORDER — FAMOTIDINE 20 MG PO TABS
10.0000 mg | ORAL_TABLET | Freq: Once | ORAL | Status: AC
  Administered 2022-12-24: 10 mg via ORAL
  Filled 2022-12-23: qty 1

## 2022-12-23 MED ORDER — OXYCODONE HCL 5 MG PO TABS
5.0000 mg | ORAL_TABLET | ORAL | Status: DC | PRN
  Administered 2022-12-24: 5 mg via ORAL
  Filled 2022-12-23: qty 2
  Filled 2022-12-23: qty 1

## 2022-12-23 MED ORDER — CHLORHEXIDINE GLUCONATE 4 % EX SOLN
30.0000 mL | CUTANEOUS | Status: DC
Start: 2022-12-23 — End: 2022-12-23

## 2022-12-23 MED ORDER — ISOSORBIDE MONONITRATE ER 60 MG PO TB24
60.0000 mg | ORAL_TABLET | Freq: Every day | ORAL | Status: DC
  Administered 2022-12-23 – 2022-12-25 (×3): 60 mg via ORAL
  Filled 2022-12-23 (×3): qty 1

## 2022-12-23 MED ORDER — ALLOPURINOL 100 MG PO TABS
100.0000 mg | ORAL_TABLET | Freq: Every evening | ORAL | Status: DC
  Administered 2022-12-23 – 2022-12-24 (×2): 100 mg via ORAL
  Filled 2022-12-23 (×2): qty 1

## 2022-12-23 MED ORDER — NITROGLYCERIN IN D5W 200-5 MCG/ML-% IV SOLN
0.0000 ug/min | INTRAVENOUS | Status: DC
  Filled 2022-12-23: qty 250

## 2022-12-23 MED ORDER — ASPIRIN 81 MG PO CHEW
81.0000 mg | CHEWABLE_TABLET | Freq: Every day | ORAL | Status: DC
  Administered 2022-12-25: 81 mg via ORAL
  Filled 2022-12-23 (×2): qty 1

## 2022-12-23 MED ORDER — SODIUM CHLORIDE 0.9 % IV SOLN: 250.0000 mL | INTRAVENOUS | Status: DC | PRN

## 2022-12-23 MED ORDER — LIDOCAINE 2% (20 MG/ML) 5 ML SYRINGE
INTRAMUSCULAR | Status: DC | PRN
  Administered 2022-12-23: 30 mg via INTRAVENOUS

## 2022-12-23 MED ORDER — CINACALCET HCL 30 MG PO TABS
60.0000 mg | ORAL_TABLET | ORAL | Status: DC
  Administered 2022-12-24: 60 mg via ORAL
  Filled 2022-12-23: qty 2

## 2022-12-23 MED ORDER — PROPOFOL 500 MG/50ML IV EMUL
INTRAVENOUS | Status: DC | PRN
  Administered 2022-12-23: 80 ug/kg/min via INTRAVENOUS

## 2022-12-23 MED ORDER — PANTOPRAZOLE SODIUM 40 MG PO TBEC
40.0000 mg | DELAYED_RELEASE_TABLET | Freq: Two times a day (BID) | ORAL | Status: DC
  Administered 2022-12-23 – 2022-12-25 (×4): 40 mg via ORAL
  Filled 2022-12-23 (×4): qty 1

## 2022-12-23 MED ORDER — LORAZEPAM 0.5 MG PO TABS
0.5000 mg | ORAL_TABLET | Freq: Three times a day (TID) | ORAL | Status: DC | PRN
  Administered 2022-12-24: 0.5 mg via ORAL
  Filled 2022-12-23: qty 1

## 2022-12-23 MED ORDER — LORAZEPAM 0.5 MG PO TABS
1.0000 mg | ORAL_TABLET | Freq: Three times a day (TID) | ORAL | Status: DC | PRN
  Administered 2022-12-23: 1 mg via ORAL

## 2022-12-23 MED ORDER — ONDANSETRON HCL 4 MG/2ML IJ SOLN
4.0000 mg | Freq: Four times a day (QID) | INTRAMUSCULAR | Status: DC | PRN
  Administered 2022-12-23 – 2022-12-24 (×2): 4 mg via INTRAVENOUS
  Filled 2022-12-23 (×2): qty 2

## 2022-12-23 MED ORDER — ZOLPIDEM TARTRATE 5 MG PO TABS: 5.0000 mg | ORAL_TABLET | Freq: Every evening | ORAL | Status: DC | PRN

## 2022-12-23 MED ORDER — IOHEXOL 350 MG/ML SOLN
INTRAVENOUS | Status: DC | PRN
  Administered 2022-12-23: 100 mL

## 2022-12-23 MED ORDER — ACETAMINOPHEN 325 MG PO TABS
650.0000 mg | ORAL_TABLET | Freq: Four times a day (QID) | ORAL | Status: DC | PRN
  Administered 2022-12-24: 650 mg via ORAL
  Filled 2022-12-23: qty 2

## 2022-12-23 SURGICAL SUPPLY — 26 items
BAG SNAP BAND KOVER 36X36 (MISCELLANEOUS) ×2 IMPLANT
CABLE SURGICAL S-101-97-12 (CABLE) IMPLANT
CATH 23 ULTRA DELIVERY (CATHETERS) IMPLANT
CATH DIAG 6FR PIGTAIL ANGLED (CATHETERS) IMPLANT
CATH INFINITI 5FR ANG PIGTAIL (CATHETERS) IMPLANT
CATH INFINITI 6F AL1 (CATHETERS) IMPLANT
CATH INFINITI 6F AL2 (CATHETERS) IMPLANT
CLOSURE MYNX CONTROL 6F/7F (Vascular Products) IMPLANT
CLOSURE PERCLOSE PROSTYLE (VASCULAR PRODUCTS) IMPLANT
CRIMPER (MISCELLANEOUS) IMPLANT
KIT SAPIAN 3 ULTRA RESILIA 23 (Valve) IMPLANT
PACK CARDIAC CATHETERIZATION (CUSTOM PROCEDURE TRAY) ×1 IMPLANT
SET ATX-X65L (MISCELLANEOUS) IMPLANT
SHEATH INTRODUCER SET 20-26 (SHEATH) IMPLANT
SHEATH PINNACLE 6F 10CM (SHEATH) IMPLANT
SHEATH PINNACLE 8F 10CM (SHEATH) IMPLANT
STOPCOCK MORSE 400PSI 3WAY (MISCELLANEOUS) ×2 IMPLANT
TUBING ART PRESS 72 MALE/FEM (TUBING) IMPLANT
WIRE AMPLATZ SS-J .035X260CM (WIRE) IMPLANT
WIRE EMERALD 3MM-J .035X150CM (WIRE) IMPLANT
WIRE EMERALD 3MM-J .035X260CM (WIRE) IMPLANT
WIRE EMERALD ST .035X260CM (WIRE) IMPLANT
WIRE G STIF.035X180 STR (WIRE) IMPLANT
WIRE HITORQ VERSACORE ST 145CM (WIRE) IMPLANT
WIRE MICRO SET SILHO 5FR 7 (SHEATH) IMPLANT
WIRE SAFARI SM CURVE 275 (WIRE) IMPLANT

## 2022-12-23 NOTE — Progress Notes (Signed)
  Echocardiogram 2D Echocardiogram has been performed.  Janalyn Harder 12/23/2022, 3:09 PM

## 2022-12-23 NOTE — Interval H&P Note (Signed)
History and Physical Interval Note:  12/23/2022 1:22 PM  Jodi Bell  has presented today for surgery, with the diagnosis of Severe Aortic Stenosis.  The various methods of treatment have been discussed with the patient and family. After consideration of risks, benefits and other options for treatment, the patient has consented to  Procedure(s): Transcatheter Aortic Valve Replacement, Transfemoral (N/A) INTRAOPERATIVE TRANSTHORACIC ECHOCARDIOGRAM (N/A) as a surgical intervention.  The patient's history has been reviewed, patient examined, no change in status, stable for surgery.  I have reviewed the patient's chart and labs.  Questions were answered to the patient's satisfaction.     [name]

## 2022-12-23 NOTE — Interval H&P Note (Signed)
History and Physical Interval Note:  12/23/2022 1:24 PM  Jodi Bell  has presented today for surgery, with the diagnosis of Severe Aortic Stenosis.  The various methods of treatment have been discussed with the patient and family. After consideration of risks, benefits and other options for treatment, the patient has consented to  Procedure(s): Transcatheter Aortic Valve Replacement, Transfemoral (N/A) INTRAOPERATIVE TRANSTHORACIC ECHOCARDIOGRAM (N/A) as a surgical intervention.  The patient's history has been reviewed, patient examined, no change in status, stable for surgery.  I have reviewed the patient's chart and labs.  Questions were answered to the patient's satisfaction.     Alleen Borne

## 2022-12-23 NOTE — Progress Notes (Signed)
  [facility]   MULTIDISCIPLINARY HEART VALVE TEAM  Patient doing well s/p TAVR. She is hemodynamically stable. Groin sites stable. ECG with no high grade block however with new LBBB noted. She is having mild bilateral feet and sternum pruritus with no evidence of hives. Patient given one dose of Benadryl with improvement. Also restarted home dose PO ativan 0.5mg  Q8H PRN. BP elevated post-op therefore restarted olmesartan. Nephrology notified with potential plan for HD prior tomorrow. LVEDP during case at . Plan transfer from cath lab holding to 4E when bed available. Early ambulation after bedrest completed and hopeful discharge over the next 24-48 hours.   Georgie Chard NP-C Structural Heart Team  Pager: [phone] Phone: [phone]

## 2022-12-23 NOTE — Progress Notes (Signed)
Nurse notified question of whether to start IV fluids per post TAVR order. Patient is ESRD on HD. D/w Dr. Royann Shivers - no need for IVF. Order written for saline lock.

## 2022-12-23 NOTE — Discharge Instructions (Signed)

## 2022-12-23 NOTE — Consult Note (Signed)
Renal Service Consult Note [facility]  BISCEGLIA PYEATT 12/23/2022 Maree Krabbe, MD Requesting Physician: Dr. Lynnette Caffey  Reason for Consult: ESRD pt s/p TAVR done today at [facility] HPI: The patient is a 62 y.o. year-old w/ PMH as below who presented for elective TAVR due to progressive aortic valve stenosis.  Post -AVR bp's were stable. Pt is to be admitted. We are asked to see for dialysis.   Pt seen in PACU holding area post procedure. Pt is a bit restless and c/o back pain. Otherwise no SOB, cough , or other c/o's.   ROS - n/a  Past Medical History  Past Medical History:  Diagnosis Date   Anemia of chronic disease    Anxiety    Arthritis    oa and psoriatic ra   Asthma    Back pain, chronic    lower back   Chronic insomnia    COPD (chronic obstructive pulmonary disease) (HCC)    Diabetes mellitus    type 2   ESRD on dialysis [facility])    Essential hypertension    GERD (gastroesophageal reflux disease)    Gout    History of hiatal hernia    Hypoalbuminemia    Hyponatremia    Mild aortic stenosis    Mitral regurgitation    Mitral stenosis    Obesity    PAD (peripheral artery disease) (HCC)    a. externial iliac calcification seen on CT 04/2020.   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 Lynnette Caffey & Laneta Simmers.   Sleep apnea    Tobacco abuse    Upper GI bleed 05/2019   Past Surgical History  Past Surgical History:  Procedure Laterality Date   BACK SURGERY  2016   lower back fusion with cage   BIOPSY  09/23/2017   Procedure: BIOPSY;  Surgeon: Graylin Shiver, MD;  Location: [facility] ENDOSCOPY;  Service: Endoscopy;;   BIOPSY  05/19/2019   Procedure: BIOPSY;  Surgeon: Kathi Der, MD;  Location: [facility] ENDOSCOPY;  Service: Gastroenterology;;   CESAREAN SECTION  1990   x 1    COLONOSCOPY WITH PROPOFOL N/A 09/23/2017   Procedure: COLONOSCOPY WITH PROPOFOL Hemostatic clips placed;   Surgeon: Graylin Shiver, MD;  Location: [facility] ENDOSCOPY;  Service: Endoscopy;  Laterality: N/A;   COLONOSCOPY WITH PROPOFOL N/A 10/02/2020   Procedure: COLONOSCOPY WITH PROPOFOL;  Surgeon: Willis Modena, MD;  Location: [facility] ENDOSCOPY;  Service: Endoscopy;  Laterality: N/A;   CORONARY ARTERY BYPASS GRAFT N/A 09/13/2020   Procedure: CORONARY ARTERY BYPASS GRAFTING (CABG), ON PUMP, TIMES TWO, USING LEFT INTERNAL MAMMARY ARTERY AND RIGHT ENDOSCOPICALLY HARVESTED GREATER SAPHENOUS VEIN;  Surgeon: Alleen Borne, MD;  Location: [facility] OR;  Service: Open Heart Surgery;  Laterality: N/A;   DILATION AND CURETTAGE OF UTERUS  1988   ENTEROSCOPY N/A 07/22/2021   Procedure: ENTEROSCOPY;  Surgeon: Kerin Salen, MD;  Location: [facility] ENDOSCOPY;  Service: Gastroenterology;  Laterality: N/A;   ESOPHAGOGASTRODUODENOSCOPY N/A 09/22/2020   Procedure: ESOPHAGOGASTRODUODENOSCOPY (EGD);  Surgeon: Willis Modena, MD;  Location: [facility] ENDOSCOPY;  Service: Endoscopy;  Laterality: N/A;   ESOPHAGOGASTRODUODENOSCOPY (EGD) WITH PROPOFOL N/A 09/23/2017   Procedure: ESOPHAGOGASTRODUODENOSCOPY (EGD) WITH PROPOFOL;  Surgeon: Graylin Shiver, MD;  Location: [facility] ENDOS[facility];  Service: Endoscopy;  Laterality: N/A;   ESOPHAGOGASTRODUODENOSCOPY (EGD) WITH PROPOFOL N/A 05/19/2019   Procedure: ESOPHAGOGASTRODUODENOSCOPY (EGD) WITH PROPOFOL;  Surgeon: Kathi Der, MD;  Location: [facility] ENDOS[facility];  Service: Gastroenterology;  Laterality: N/A;   ESOPHAGOGASTRODUODENOSCOPY (EGD) WITH PROPOFOL N/A 10/02/2020   Procedure: ESOPHAGOGASTRODUODENOSCOPY (EGD) WITH PROPOFOL;  Surgeon: Willis Modena, MD;  Location: [facility] ENDOSCOPY;  Service: Endoscopy;  Laterality: N/A;   GIVENS CAPSULE STUDY N/A 05/19/2019   Procedure: GIVENS CAPSULE STUDY;  Surgeon: Kathi Der, MD;  Location: [facility] ENDOSCOPY;  Service: Gastroenterology;  Laterality: N/A;   GIVENS CAPSULE STUDY N/A 07/19/2021   Procedure: GIVENS CAPSULE STUDY;  Surgeon: [name], MD;  Location: [facility] ENDOSCOPY;  Service:  Gastroenterology;  Laterality: N/A;   HEMOSTASIS CLIP PLACEMENT  09/22/2020   Procedure: HEMOSTASIS CLIP PLACEMENT;  Surgeon: Willis Modena, MD;  Location: [facility] ENDOSCOPY;  Service: Endoscopy;;   HEMOSTASIS CONTROL  09/22/2020   Procedure: HEMOSTASIS CONTROL;  Surgeon: Willis Modena, MD;  Location: [facility] ENDOSCOPY;  Service: Endoscopy;;   HOT HEMOSTASIS N/A 05/19/2019   Procedure: HOT HEMOSTASIS (ARGON PLASMA COAGULATION/BICAP);  Surgeon: Kathi Der, MD;  Location: [facility] ENDOSCOPY;  Service: Gastroenterology;  Laterality: N/A;   LEFT HEART CATH AND CORONARY ANGIOGRAPHY N/A 09/10/2020   Procedure: LEFT HEART CATH AND CORONARY ANGIOGRAPHY;  Surgeon: Yvonne Kendall, MD;  [facility]: [facility] INVASIVE CV LAB;  Service: Cardiovascular;  Laterality: N/A;   PARTIAL KNEE ARTHROPLASTY Left 11/12/2017   Procedure: left unicompartmental arthroplasty-medial;  Surgeon: Durene Romans, MD;  Location: [facility] ORS;  Service: Orthopedics;  Laterality: Left;    RIGHT HEART CATH AND CORONARY/GRAFT ANGIOGRAPHY N/A 06/26/2022   Procedure: RIGHT HEART CATH AND CORONARY/GRAFT ANGIOGRAPHY;  Surgeon: [name], MD;  Location: [facility] INVASIVE CV LAB;  Service: Cardiovascular;  Laterality: N/A;   SUBMUCOSAL INJECTION  09/23/2017   Procedure: SUBMUCOSAL INJECTION;  Surgeon: Graylin Shiver, MD;  Location: [facility] ENDOSCOPY;  Service: Endoscopy;;  in colon   TEE WITHOUT CARDIOVERSION N/A 09/13/2020   Procedure: TRANSESOPHAGEAL ECHOCARDIOGRAM (TEE);  Surgeon: Alleen Borne, MD;  Location: [facility] OR;  Service: Open Heart Surgery;  Laterality: N/A;   TUBAL LIGATION  1990   Family History  Family History  Problem Relation Age of Onset   Lung cancer Mother    Diabetes Sister    Heart disease Sister    Asthma Daughter    Arthritis Daughter    Arthritis Daughter    Thyroid cancer Other        1/2 sister   Heart disease Other        1/2 sister   Social History  reports that she quit smoking about 2 years ago. Her smoking use included  cigarettes. She started smoking about 42 years ago. She has a 20 pack-year smoking history. She has never used smokeless tobacco. She reports current alcohol use. She reports that she does not use drugs. Allergies  Allergies  Allergen Reactions   3-Methyl-2-Benzothiazolinone Hydrazone Other (See Comments)    Muscle weakness muscle cramping in legs Other reaction(s): Myalgias (Muscle Pain)   Banana Anaphylaxis, Swelling and Other (See Comments)    TONGUE AND MOUTH SWELLING   Black Walnut Flavor Anaphylaxis and Itching    Walnuts   Hazelnut (Filbert) Anaphylaxis    Hazelnuts   Leflunomide And Related Other (See Comments)    Severe Headache   Lisinopril Swelling    Angioedema  Swelling of the face   No [employer] Food Allergies Anaphylaxis    Spicy Mustard 4.7.2021 Pt reports that she eats Regular Yellow Mustard Swelling and itching of tongue   Other Anaphylaxis, Swelling and Other (See Comments)    Cosentyx= angioedema Mouth itches and tongue swelling Hazel nuts and pecans also Walnuts  Renal failure Serotonin syndrome  Tremors Muscle weakness muscle cramping in legs Other reaction(s): Myalgias (Muscle Pain)   Pecan Nut (Diagnostic) Anaphylaxis and Itching    Pecans   Trazodone And Nefazodone Other (See Comments)    Serotonin Syndrome  Tremors   Adalimumab Other (See Comments)    Blisters on abdomen =humira   Escitalopram Oxalate Other (See Comments)    Hand problems - Serotonin Syndrome     Ezetimibe Other (See Comments)    myalgias cramps    Secukinumab Swelling    Cosentyx = angiodema    Statins Other (See Comments)    Leg pains and weakness   Ferrlecit [Na Ferric Gluc Cplx In Sucrose]     Not reaction given from HD   Gabapentin Other (See Comments)    tremors   Nsaids     Avoid due to renal problems    Home medications Prior to Admission medications   Medication Sig Start Date End Date Taking? Authorizing Provider  acetaminophen (TYLENOL) 500 MG tablet  Take 1,000 mg by mouth daily as needed (pain).   Yes [provider]  allopurinol (ZYLOPRIM) 100 MG tablet Take 100 mg by mouth every evening.   Yes [provider]  Artificial Tear Solution (TEARS NATURALE OP) Place 1 drop into both eyes as needed (dry eye).   Yes [provider]  carboxymethylcellulose 1 % ophthalmic solution Place 1 drop into both eyes 3 (three) times daily.   Yes [provider]  carvedilol (COREG) 25 MG tablet Take 25 mg by mouth 2 (two) times daily with a meal.   Yes [provider]  cetirizine (ZYRTEC) 10 MG tablet Take 10 mg by mouth daily as needed for allergies.   Yes [provider]  Cholecalciferol (VITAMIN D) 50 MCG (2000 UT) tablet Take 2,000 Units by mouth daily.   Yes [provider]  cinacalcet (SENSIPAR) 30 MG tablet Take 60 mg by mouth every Monday, Wednesday, and Friday with hemodialysis.   Yes [provider]  cloNIDine (CATAPRES - DOSED IN MG/24 HR) 0.3 mg/24hr patch Place 0.3 mg onto the skin every Sunday.   Yes [provider]  diclofenac Sodium (VOLTAREN) 1 % GEL Apply 2 g topically daily as needed (pain).   Yes [provider]  docusate sodium (COLACE) 100 MG capsule Take 100 mg by mouth daily.   Yes [provider]  EPINEPHrine 0.3 mg/0.3 mL IJ SOAJ injection Inject into the muscle as needed for anaphylaxis. 07/12/21  Yes [provider]  fluticasone (FLONASE) 50 MCG/ACT nasal spray Place 1 spray into both nostrils daily as needed for allergies or rhinitis.   Yes [provider]  hydrALAZINE (APRESOLINE) 100 MG tablet Take 1 tablet (100 mg total) by mouth every 8 (eight) hours. 07/06/20  Yes Cresenzo, Cyndi Lennert, MD  isosorbide mononitrate (IMDUR) 60 MG 24 hr tablet Take 1 tablet (60 mg total) by mouth daily. 10/27/22  Yes Jake Bathe, MD  LORazepam (ATIVAN) 0.5 MG tablet Take 0.5 mg by mouth every 8 (eight) hours as needed for anxiety.   Yes  [provider]  montelukast (SINGULAIR) 10 MG tablet Take 10 mg by mouth daily with supper.    Yes [provider]  multivitamin (RENA-VIT) TABS tablet Take 1 tablet by mouth every Monday, Wednesday, and Friday.   Yes [provider]  neomycin-polymyxin b-dexamethasone (MAXITROL) 3.5-10000-0.1 SUSP Place 1 drop into both eyes in the morning, at noon, in the evening, and at  bedtime. 12/18/22  Yes [provider]  NIFEdipine (PROCARDIA XL/NIFEDICAL XL) 60 MG 24 hr tablet Take 60 mg by mouth 2 (two) times daily. 10/01/21  Yes [provider]  olmesartan (BENICAR) 40 MG tablet Take 40 mg by mouth every evening. 05/06/19  Yes [provider]  ondansetron (ZOFRAN) 4 MG tablet Take 4 mg by mouth 2 (two) times daily as needed for nausea or vomiting.   Yes [provider]  OXYGEN Inhale 2-3 L into the lungs continuous.   Yes [provider]  pantoprazole (PROTONIX) 40 MG tablet Take 40 mg by mouth 2 (two) times daily. 06/09/20  Yes [provider]  predniSONE (DELTASONE) 5 MG tablet Take 1 tablet (5 mg total) by mouth daily with breakfast. 02/27/22  Yes Marrianne Mood, MD  Probiotic Product (PROBIOTIC PO) Take 1 capsule by mouth daily.   Yes [provider]  silver sulfADIAZINE (SILVADENE) 1 % cream Apply 1 Application topically daily as needed (wound care).   Yes [provider]  Tiotropium Bromide-Olodaterol (STIOLTO RESPIMAT) 2.5-2.5 MCG/ACT AERS Inhale 2 puffs once daily into the lungs. 11/28/21  Yes Hunsucker, Lesia Sago, MD  VENTOLIN HFA 108 (90 Base) MCG/ACT inhaler Inhale 2 puffs into the lungs See admin instructions. Take 2 puffs every 4-6 hours as needed for wheezing and shortness of breath 04/08/22  Yes Hunsucker, Lesia Sago, MD  White Petrolatum-Mineral Oil (SOOTHE NIGHTTIME) OINT Place 1 Application into both eyes at bedtime.   Yes [provider]  zolpidem (AMBIEN) 5 MG tablet Take 5 mg by  mouth at bedtime as needed for sleep. 02/04/21  Yes [provider]  albuterol (PROVENTIL) (2.5 MG/3ML) 0.083% nebulizer solution Take 3 mLs (2.5 mg total) by nebulization every 6 (six) hours as needed for wheezing or shortness of breath. 07/10/21   Hunsucker, Lesia Sago, MD  hydrocortisone (ANUSOL-HC) 2.5 % rectal cream Place rectally 2 (two) times daily. Patient not taking: Reported on 12/19/2022 07/23/21   Park Pope, MD  LORazepam (ATIVAN) 1 MG tablet Take 1 mg by mouth every 8 (eight) hours as needed for anxiety. Patient not taking: Reported on 12/19/2022 06/25/20   [provider]     Vitals:   12/23/22 1605 12/23/22 1610 12/23/22 1640 12/23/22 1650  BP: (!) 180/69 (!) 157/138 (!) 158/63 (!) 166/64  Pulse: 61 61 62 64  Resp: 17 17 14 15   Temp:      TempSrc:      SpO2: 92% 92% 93% 90%  Weight:      Height:       Exam Gen alert, no distress, adult female,  O2 No rash, cyanosis or gangrene Sclera anicteric, throat clear  No jvd or bruits Chest clear bilat to bases, no rales/ wheezing RRR no RG Abd soft ntnd no mass or ascites +bs GU defer MS no joint effusions or deformity Ext no LE or UE edema, no wounds or ulcers Neuro is Ox2, somnolent, nf    LUA AVF+Bruit       Renal-related home meds: - coreg 25 bid - sensipar 60mg  mwf - clonidine patch 0.3 q Sunday - hydralazine 100 tid - imdur 60 every day - procardia xl 60 bid - home O2 2-3 L  - olmesartan 300 qd    OP HD:  [facility] MWF [address]  3.5h   2K/3Ca bath  Hep none  L AVF - get records in am   Assessment/ Plan: SP TAVR - on 11/12, for severe aortic  stenosis.  ESKD - on HD MWF. Plan is for HD tomorrow.  HTN - BP's 170/ 70 range post procedure.  Volume - no gross vol overload on exam. UF goal 2.5 L as tolerated w/ HD tomorrow.  Anemia of eskd -  MBD ckd -  ***      Vinson Moselle  MD [facility] 12/23/2022, 4:59 PM  Recent Labs  Lab 12/19/22 0830 12/23/22 1158  HGB 10.8* 13.9   ALBUMIN 3.4*  --   CALCIUM 8.7*  --   CREATININE 6.22* 4.60*  K 3.6 3.7   Inpatient medications:  chlorhexidine  30 mL Topical UD   chlorhexidine  60 mL Topical Once   diphenhydrAMINE       irbesartan  300 mg Oral Daily   LORazepam       magnesium sulfate  40 mEq Other To OR   potassium chloride  80 mEq Other To OR    sodium chloride 10 mL/hr at 12/23/22 1325   heparin 30,000 units/NS 1000 mL solution for CELLSAVER     norepinephrine (LEVOPHED) Adult infusion     diphenhydrAMINE, LORazepam, LORazepam, LORazepam

## 2022-12-23 NOTE — Anesthesia Preprocedure Evaluation (Signed)
Anesthesia Evaluation  Patient identified by MRN, date of birth, ID band Patient awake    Reviewed: Allergy & Precautions, NPO status , Patient's Chart, lab work & pertinent test results  Airway Mallampati: II  TM Distance: >3 FB Neck ROM: Full    Dental no notable dental hx.    Pulmonary asthma , sleep apnea , COPD, former smoker   Pulmonary exam normal        Cardiovascular hypertension, Pt. on medications and Pt. on home beta blockers + CAD, + Peripheral Vascular Disease and +CHF  + Valvular Problems/Murmurs AS  Rhythm:Regular Rate:Normal + Systolic murmurs IMPRESSIONS    1. Left ventricular ejection fraction, by estimation, is 60 to 65%. The left ventricle has normal function. The left ventricle has no regional wall motion abnormalities. There is severe concentric left ventricular hypertrophy. Left ventricular diastolic  parameters are consistent with Grade II diastolic dysfunction (pseudonormalization). Elevated left atrial pressure.  2. Right ventricular systolic function is moderately reduced. The right ventricular size is moderately enlarged. Mildly increased right ventricular wall thickness. There is severely elevated pulmonary artery systolic pressure. The estimated right ventricular systolic pressure is 71.0 mmHg.  3. Left atrial size was severely dilated.  4. Right atrial size was moderately dilated.  5. The mitral valve is degenerative. Trivial mitral valve regurgitation. Moderate mitral annular calcification.  6. The tricuspid valve is abnormal. 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  7. The aortic valve is bicuspid. Aortic valve regurgitation is not visualized. Severe aortic valve stenosis. Aortic valve mean gradient measures 52.0 mmHg. Aortic valve Vmax measures 4.92 m/s. Aortic valve acceleration time measures 108 msec.  8. The inferior vena cava is dilated in size with <50% respiratory variability,  suggesting right atrial pressure of 15 mmHg.   Comparison(s): A prior study was performed on 07/17/2021. Aortic stenosis is now severe, tricuspid insufficiency is worse.    Neuro/Psych   Anxiety        GI/Hepatic Neg liver ROS, hiatal hernia,GERD  Medicated,,  Endo/Other  diabetes, Type 2, Insulin Dependent    Renal/GU   negative genitourinary   Musculoskeletal  (+) Arthritis , Osteoarthritis,    Abdominal Normal abdominal exam  (+)   Peds  Hematology  (+) Blood dyscrasia, anemia Lab Results      Component                Value               Date                      WBC                      2.8 (L)             12/19/2022                HGB                      10.8 (L)            12/19/2022                HCT                      38.4                12/19/2022  MCV                      109.1 (H)           12/19/2022                PLT                      141 (L)             12/19/2022             Lab Results      Component                Value               Date                      NA                       133 (L)             12/19/2022                K                        3.6                 12/19/2022                CO2                      21 (L)              12/19/2022                GLUCOSE                  129 (H)             12/19/2022                BUN                      27 (H)              12/19/2022                CREATININE               6.22 (H)            12/19/2022                CALCIUM                  8.7 (L)             12/19/2022                GFRNONAA                 7 (L)               12/19/2022              Anesthesia Other Findings   Reproductive/Obstetrics                             Anesthesia Physical Anesthesia Plan  ASA: 4  Anesthesia Plan: MAC   Post-op Pain Management:    Induction: Intravenous  PONV Risk Score and Plan: 2 and Treatment may vary due to age or medical condition,  Ondansetron, Dexamethasone and Propofol infusion  Airway Management Planned: Simple Face Mask and Nasal Cannula  Additional Equipment: Arterial line  Intra-op Plan:   Post-operative Plan:   Informed Consent: I have reviewed the patients History and Physical, chart, labs and discussed the procedure including the risks, benefits and alternatives for the proposed anesthesia with the patient or authorized representative who has indicated his/her understanding and acceptance.     Dental advisory given  Plan Discussed with: CRNA  Anesthesia Plan Comments:        Anesthesia Quick Evaluation

## 2022-12-23 NOTE — Op Note (Signed)
[facility]  TAVR OPERATIVE NOTE   Date of Procedure:  12/23/2022  Preoperative Diagnosis: Severe Aortic Stenosis   Postoperative Diagnosis: Same   Procedure:   Transcatheter Aortic Valve Replacement - Transfemoral Approach  Edwards Sapien 3 Resilia THV (size 23 mm, model # 9755RLS, serial # [id number])   Co-Surgeons:   Alleen Borne, MD and Alverda Skeans, MD Anesthesiologist:  Hester Mates, MD  Echocardiographer:  Charlton Haws, MD  Pre-operative Echo Findings: Severe aortic stenosis Normal left ventricular systolic function  Post-operative Echo Findings: No paravalvular leak Normal left ventricular systolic function  Left Heart Catheterization Findings: Left ventricular end-diastolic pressure of   BRIEF CLINICAL NOTE AND INDICATIONS FOR SURGERY  This is a 62 year old female with a history of coronary artery disease status post CABG, end-stage renal disease on hemodialysis, chronic respiratory failure on home oxygen, type 2 diabetes, hypertension, previous GI bleeding, and critical aortic stenosis was referred for elective TAVR with a 23 mm SAPIEN 3 valve from the right transfemoral approach.  During the course of the patient's preoperative work up they have been evaluated comprehensively by a multidisciplinary team of specialists coordinated through the Multidisciplinary [facility]ve Clinic in the [facility].  They have been demonstrated to suffer from symptomatic severe aortic stenosis as noted above. The patient has been counseled extensively as to the relative risks and benefits of all options for the treatment of severe aortic stenosis including long term medical therapy, conventional surgery for aortic valve replacement, and transcatheter aortic valve replacement.  The patient has been independently evaluated by Dr. Laneta Simmers with CT surgery and they are felt to be at high risk for conventional surgical aortic valve  replacement. The surgeon indicated the patient would be a poor candidate for conventional surgery. Based upon review of all of the patient's preoperative diagnostic tests they are felt to be candidate for transcatheter aortic valve replacement using the transfemoral approach as an alternative to high risk conventional surgery.    Following the decision to proceed with transcatheter aortic valve replacement, a discussion has been held regarding what types of management strategies would be attempted intraoperatively in the event of life-threatening complications, including whether or not the patient would be considered a candidate for the use of cardiopulmonary bypass and/or conversion to open sternotomy for attempted surgical intervention.  The patient has been advised of a variety of complications that might develop peculiar to this approach including but not limited to risks of death, stroke, paravalvular leak, aortic dissection or other major vascular complications, aortic annulus rupture, device embolization, cardiac rupture or perforation, acute myocardial infarction, arrhythmia, heart block or bradycardia requiring permanent pacemaker placement, congestive heart failure, respiratory failure, renal failure, pneumonia, infection, other late complications related to structural valve deterioration or migration, or other complications that might ultimately cause a temporary or permanent loss of functional independence or other long term morbidity.  The patient provides full informed consent for the procedure as described and all questions were answered preoperatively.    DETAILS OF THE OPERATIVE PROCEDURE  PREPARATION:   The patient is brought to the operating room on the above mentioned date and central monitoring was established by the anesthesia team including placement of a radial arterial line. The patient is placed in the supine position on the operating table.  Intravenous antibiotics are  administered. Conscious sedation is used.   Baseline transthoracic echocardiogram was performed. The patient's chest, abdomen, both groins, and both lower extremities are prepared and draped  in a sterile manner. A time out procedure is performed.   PERIPHERAL ACCESS:   Using the modified Seldinger technique, femoral arterial and venous access were obtained with placement of a 6 Fr sheath in the left femoral artery u/s guidance.  A pigtail diagnostic catheter was passed through the femoral arterial sheath under fluoroscopic guidance into the aortic root.  A temporary transvenous pacemaker catheter was passed through the femoral venous sheath under fluoroscopic guidance into the right ventricle.  T  TRANSFEMORAL ACCESS:  A micropuncture kit was used to gain access to the right femoral artery using u/s guidance. Position confirmed with angiography. Pre-closure with double ProGlide closure devices. The patient was heparinized systemically and ACT verified > 250 seconds.    A 14 Fr transfemoral E-sheath was introduced into the right femoral artery after progressively dilating over an Amplatz superstiff wire. An AL-2 catheter was used to direct a straight-tip stiff glidewire across the native aortic valve into the left ventricle. This was exchanged out for a pigtail catheter and position was confirmed in the LV apex. Simultaneous left ventricular, aortic, and left ventricular end-diastolic pressures were recorded.  The pigtail catheter was then exchanged for an Safari wire in the LV apex.  Direct LV pacing thresholds were assessed and found to be adequate.   TRANSCATHETER HEART VALVE DEPLOYMENT:  An Edwards Sapien 3 THV (size 23 mm with two less cc of volume) was prepared and crimped per manufacturer's guidelines, and the proper orientation of the valve is confirmed on the [employer] delivery system. The valve was advanced through the introducer sheath using normal technique until in an appropriate  position in the abdominal aorta beyond the sheath tip. The balloon was then retracted and using the fine-tuning wheel was centered on the valve. The valve was then advanced across the aortic arch using appropriate flexion of the catheter. The valve was carefully positioned across the aortic valve annulus. The Commander catheter was retracted using normal technique. Once final position of the valve has been confirmed by angiographic assessment, the valve is deployed while temporarily holding ventilation and during rapid ventricular pacing to maintain systolic blood pressure < 50 mmHg and pulse pressure < 10 mmHg. The balloon inflation is held for >3 seconds after reaching full deployment volume. Once the balloon has fully deflated the balloon is retracted into the ascending aorta and valve function is assessed using TTE. There is felt to be no paravalvular leak and no central aortic insufficiency.  The patient's hemodynamic recovery following valve deployment is good.  The deployment balloon and guidewire are both removed. Echo demostrated acceptable post-procedural gradients, stable mitral valve function, and no AI.   PROCEDURE COMPLETION:  The sheath was then removed and closure devices were completed. Protamine was administered once femoral arterial repair was complete. The pigtail catheters and femoral sheaths were removed with a Mynx closure device placed in the artery and manual pressure used for venous hemostasis.    The patient tolerated the procedure well and is transported to the surgical intensive care in stable condition. There were no immediate intraoperative complications. All sponge instrument and needle counts are verified correct at completion of the operation.   No blood products were administered during the operation.  The patient received a total of 100 mL of intravenous contrast during the procedure.  [name] MD 12/23/2022 3:18 PM

## 2022-12-23 NOTE — Op Note (Signed)
[facility]   MULTIDISCIPLINARY HEART VALVE TEAM   TAVR OPERATIVE NOTE   Date of Procedure:  12/23/2022  Preoperative Diagnosis: Severe Aortic Stenosis   Postoperative Diagnosis: Same   Procedure:   Transcatheter Aortic Valve Replacement - Percutaneous Right Transfemoral Approach  Edwards Sapien 3 Ultra Resilia THV (size 23 mm - 2cc, model # [id number], serial # [id number])   Co-Surgeons:  Alleen Borne, MD and Alverda Skeans, MD   Anesthesiologist:  G. Stoltzfus, DO  Echocardiographer:  P. Eden Emms, MD  Pre-operative Echo Findings: Severe aortic stenosis Normal left ventricular systolic function  Post-operative Echo Findings: No paravalvular leak Normal left ventricular systolic function   BRIEF CLINICAL NOTE AND INDICATIONS FOR SURGERY  This 62 year old woman has stage D, severe, symptomatic aortic stenosis with NYHA class III symptoms of exertional fatigue and shortness of breath consistent with chronic diastolic congestive heart failure.  I have personally reviewed her 2D echo, cardiac catheterization, and CTA studies.  Her echo shows a bicuspid aortic valve with heavy annular calcification with a mean gradient of 52 mmHg and a valve area by VTI of 0.8 cm consistent with severe aortic stenosis.  Left ventricular ejection fraction is normal.  There is moderate tricuspid regurgitation with moderate RV systolic dysfunction and RV dilation.  Cardiac catheterization showed severe native coronary disease with a patent LIMA to the LAD but the saphenous vein graft to the PDA could not be visualized.  This was noted to be patent on her gated cardiac CTA.  She has severe pulmonary hypertension.  I think aortic valve replacement is the best treatment to try to improve her symptoms and prevent progressive biventricular dysfunction.  Hopefully replacing her aortic valve would bring down her pulmonary artery pressures and reduce the pressure overload of the right side and the  tricuspid regurgitation.  I do not think she is a candidate for open surgical aortic valve replacement given her previous heart surgery, end-stage renal disease on hemodialysis, oxygen dependent lung disease, and other comorbidities.  I think transcatheter aortic valve replacement would be a reasonable alternative.  Her measurements are suitable for a 23 mm SAPIEN 3 valve.  She does have extensive annular and subannular calcium along the left and right cusps which could increase the risk of paravalvular leak and annular rupture.  I reviewed the CT and echo images with her and her family and answered all of their questions.  Her abdominal and pelvic CTA shows adequate pelvic vascular anatomy to allow transfemoral insertion.   The patient and her daughter were counseled at length regarding treatment alternatives for management of severe symptomatic aortic stenosis. The risks and benefits of surgical intervention has been discussed in detail. Long-term prognosis with medical therapy was discussed. Alternative approaches such as conventional surgical aortic valve replacement, transcatheter aortic valve replacement, and palliative medical therapy were compared and contrasted at length. This discussion was placed in the context of the patient's own specific clinical presentation and past medical history. All of their questions have been addressed.    Following the decision to proceed with transcatheter aortic valve replacement, a discussion was held regarding what types of management strategies would be attempted intraoperatively in the event of life-threatening complications.  I do not think she is a candidate for emergent sternotomy to manage any intraoperative complications due to her previous heart surgery and multiple comorbidities.  The patient has been advised of a variety of complications that might develop including but not limited to risks of death, stroke, paravalvular  leak, aortic dissection or other major  vascular complications, aortic annulus rupture, device embolization, cardiac rupture or perforation, mitral regurgitation, acute myocardial infarction, arrhythmia, heart block or bradycardia requiring permanent pacemaker placement, congestive heart failure, respiratory failure, renal failure, pneumonia, infection, other late complications related to structural valve deterioration or migration, or other complications that might ultimately cause a temporary or permanent loss of functional independence or other long term morbidity. The patient provides full informed consent for the procedure as described and all questions were answered.       DETAILS OF THE OPERATIVE PROCEDURE  PREPARATION:    The patient was brought to the operating room on the above mentioned date and appropriate monitoring was established by the anesthesia team. The patient was placed in the supine position on the operating table.  Intravenous antibiotics were administered. The patient was monitored closely throughout the procedure under conscious sedation.    Baseline transthoracic echocardiogram was performed. The patient's abdomen and both groins were prepped and draped in a sterile manner. A time out procedure was performed.   PERIPHERAL ACCESS:    Using the modified Seldinger technique, femoral arterial access was obtained with placement of a 6 Fr sheath on the left side.  A pigtail diagnostic catheter was passed through the left arterial sheath under fluoroscopic guidance into the aortic root. Aortic root angiography was performed in order to determine the optimal angiographic angle for valve deployment.   TRANSFEMORAL ACCESS:   Percutaneous transfemoral access and sheath placement was performed using ultrasound guidance.  The right common femoral artery was cannulated using a micropuncture needle and appropriate location was verified using hand injection angiogram.  A pair of Abbott Perclose percutaneous closure devices  were placed and a 6 French sheath replaced into the femoral artery.  The patient was heparinized systemically and ACT verified > 250 seconds.    A 14 Fr transfemoral E-sheath was introduced into the right common femoral artery after progressively dilating over an Amplatz superstiff wire. An AL-2 catheter was used to direct a straight-tip exchange length wire across the native aortic valve into the left ventricle. This was exchanged out for a pigtail catheter and position was confirmed in the LV apex. Simultaneous LV and Ao pressures were recorded.  The pigtail catheter was exchanged for a Safari wire in the LV apex. Direct LV pacing threshold through the [facility] wire was tested and was satisfactory.  BALLOON AORTIC VALVULOPLASTY:   Not performed   TRANSCATHETER HEART VALVE DEPLOYMENT:   An Edwards Sapien 3 Ultra transcatheter heart valve (size 23 mm - 2cc) was prepared and crimped per manufacturer's guidelines, and the proper orientation of the valve is confirmed on the [employer] delivery system. The valve was advanced through the introducer sheath using normal technique until in an appropriate position in the abdominal aorta beyond the sheath tip. The balloon was then retracted and using the fine-tuning wheel was centered on the valve. The valve was then advanced across the aortic arch using appropriate flexion of the catheter. The valve was carefully positioned across the aortic valve annulus. The Commander catheter was retracted using normal technique. Once final position of the valve has been confirmed by angiographic assessment, the valve is deployed during rapid ventricular pacing to maintain systolic blood pressure < 50 mmHg and pulse pressure < 10 mmHg. The balloon inflation is held for >3 seconds after reaching full deployment volume. Once the balloon has fully deflated the balloon is retracted into the ascending aorta and valve function is assessed  using echocardiography. There is felt to  be no paravalvular leak and no central aortic insufficiency.  The patient's hemodynamic recovery following valve deployment is good.  The deployment balloon and guidewire are both removed.    PROCEDURE COMPLETION:   The sheath was removed and femoral artery closure performed.  Protamine was administered once femoral arterial repair was complete. The temporary pacemaker, pigtail catheter and femoral sheaths were removed with manual pressure used for venous hemostasis.  A Mynx femoral closure device was utilized following removal of the diagnostic sheath in the left femoral artery.  The patient tolerated the procedure well and is transported to the cath lab recovery area in stable condition. There were no immediate intraoperative complications. All sponge instrument and needle counts are verified correct at completion of the operation.   No blood products were administered during the operation.  The patient received a total of 100 mL of intravenous contrast during the procedure.   Alleen Borne, MD 12/23/2022 4:16 PM

## 2022-12-23 NOTE — Discharge Summary (Incomplete)
[facility]   MULTIDISCIPLINARY HEART VALVE TEAM  Discharge Summary    Patient ID: Jodi Bell MRN: [id number]; DOB: [birth year]  Admit date: 12/23/2022 Discharge date: 12/24/2022  Primary Care Provider: Malka So., MD  Primary Cardiologist: Donato Schultz, MD / Dr. Lynnette Caffey & Dr. Laneta Simmers (TAVR)  Discharge Diagnoses    Principal Problem:   S/P TAVR (transcatheter aortic valve replacement) Active Problems:   Diabetes mellitus with coincident hypertension (HCC)   Essential hypertension   COPD (chronic obstructive pulmonary disease) (HCC)   ESRD (end stage renal disease) (HCC)   S/P CABG x 2   Acute on chronic diastolic heart failure (HCC)   Paroxysmal atrial fibrillation (HCC)   Severe aortic stenosis   LBBB (left bundle branch block)  Allergies Allergies  Allergen Reactions   3-Methyl-2-Benzothiazolinone Hydrazone Other (See Comments)    Muscle weakness muscle cramping in legs Other reaction(s): Myalgias (Muscle Pain)   Banana Anaphylaxis, Swelling and Other (See Comments)    TONGUE AND MOUTH SWELLING   Black Walnut Flavor Anaphylaxis and Itching    Walnuts   Hazelnut (Filbert) Anaphylaxis    Hazelnuts   Leflunomide And Related Other (See Comments)    Severe Headache   Lisinopril Swelling    Angioedema  Swelling of the face   No Healthtouch Food Allergies Anaphylaxis    Spicy Mustard 4.7.2021 Pt reports that she eats Regular Yellow Mustard Swelling and itching of tongue   Other Anaphylaxis, Swelling and Other (See Comments)    Cosentyx= angioedema Mouth itches and tongue swelling Hazel nuts and pecans also Walnuts Renal failure Serotonin syndrome  Tremors Muscle weakness muscle cramping in legs Other reaction(s): Myalgias (Muscle Pain)   Pecan Nut (Diagnostic) Anaphylaxis and Itching    Pecans   Trazodone And Nefazodone Other (See Comments)    Serotonin Syndrome  Tremors   Adalimumab Other (See Comments)    Blisters on abdomen  =humira   Escitalopram Oxalate Other (See Comments)    Hand problems - Serotonin Syndrome     Ezetimibe Other (See Comments)    myalgias cramps    Secukinumab Swelling    Cosentyx = angiodema    Statins Other (See Comments)    Leg pains and weakness   Ferrlecit [Na Ferric Gluc Cplx In Sucrose]     Not reaction given from HD   Gabapentin Other (See Comments)    tremors   Nsaids     Avoid due to renal problems     Diagnostic Studies/Procedures    TAVR OPERATIVE NOTE     Date of Procedure:                12/23/2022   Preoperative Diagnosis:      Severe Aortic Stenosis    Postoperative Diagnosis:    Same    Procedure:        Transcatheter Aortic Valve Replacement - Percutaneous Right Transfemoral Approach             Edwards Sapien 3 Ultra Resilia THV (size 23 mm - 2cc, model # 9755RSL, serial # [id number])              Co-Surgeons:                        Alleen Borne, MD and Alverda Skeans, MD     Anesthesiologist:                  Lavenia Atlas, DO  Echocardiographer:              Burna Forts, MD   Pre-operative Echo Findings: Severe aortic stenosis Normal left ventricular systolic function   Post-operative Echo Findings: No paravalvular leak Normal left ventricular systolic function  _____________    Echo 12/24/22: completed but pending formal read at the time of discharge   History of Present Illness     Jodi Bell is a 62 y.o. female with a history of CAD s/p CABGx2V (LIMA-->LAD, SVG-->PDA in 2022 by Dr. Laneta Simmers), ESRD on HD (MWF), former tobacco abuse, chronic respiratory failure on home 02, diet controlled DMT2, HTN, GI bleeding, and critical AS who presented to [facility] on 12/23/22 for planned TAVR.  Pt has a long history of GI bleeding. Capsule endoscopy in 07/2021 showed blood in small bowel of unclear source and enteroscopy done to f/u capsule endo did not show any blood products or bleeding. CT abdomen pelvis with contrast 02/25/2022 done for LLQ pain  showed small volume abdominal ascites along with mesenteric edema and diffuse body wall edema, diverticulosis, stable large vascular clips in stomach related to prior GI bleed.   Her aortic stenosis was first evaluated around 2022 at [facility] while undergoing a renal transplant evaluation. At that time she had normal LV function with mild LVH, trivial AI/MR, and mild to moderate AS. She later presented to [facility] with NSTEMI and ultimately underwent two vessel CABG in 2022 with Dr. Laneta Simmers. She has had ongoing issues with COPD exacerbations and acute CHF c/b hypoxemia requiring hospitalizations. She contracted COVID in early 2023 and since that time has developed an oxygen requirement. She was evaluated by pulmonology and despite rather long smoking history, her PFTs showed no evidence of COPD but was concerning for interstitial lung disease. High-resolution chest CT in 2023 was negative for ILD.   Echo 07/02/22 at [facility] showed progression of AS into the severe range with a mean gradient at 37 mmHg and she was referred to the Structural Heart Team for evaluation. [facility] 07/24/22 showed patent LIMA to LAD and occluded SVG to PDA with occluded LAD and mRCA with moderate disease of the mLCx. CT scans 07/24/22 showed dense calcium at the sub-annular left and right cusps with concern for severe post TAVR perivalvular leak or annular disruption with deployment. She was relatively asymptomatic at the time so continued surveillance was recommended. Repeat echo 12/09/22 showed EF 60-65%, severe concentric LVH, and critical AS with a mean grad 52 mmHg, AVA 0.80 cm2, and mod-severe TR. She was seen back in the office and reported lifestyle limiting shortness of breath. Case discussed with multidisciplinary team with plans to proceed with TAVR despite higher than normal risks. This was set up for 12/23/2022.     Hospital Course     Consultants: none    Critical AS: s/p successful TAVR with a 23 mm Edwards Sapien 3 THV via  the TF approach on 12/23/22. Post operative echo completed but pending formal read. Groin sites are stable. Started on a baby aspirin 81mg  daily. Walked with cardiac rehab with no issues. Plan for discharge home with close follow up in the outpatient setting.   New LBBB: pt developed a new LBBB after TAVR. She has had no evidence of HAVB. Will resumed her home Coreg and discharge with live ambulatory telemetry to rule out late presenting HAVB.  CAD s/p CABGx2V: [facility] 07/24/22 showed patent LIMA to LAD and occluded SVG to PDA with occluded LAD and mRCA with moderate disease  of the mLCx. Started on a baby aspirin 81mg  daily as above. She is intolerant to statins. Continue Coreg 25mg  BID.   Acute on chronic HFpEF: LVEDP 34 mm hg at the time of TAVR. Volume management per nephrology.   ESRD on HD (MWF): continue regular schedule. She underwent her normally scheduled HD in the hospital today.   Chronic respiratory failure on home 02: stable. Resume home 02.  HTN: BP well controlled. Resumed on home Coreg 25mg  BID, clonidine patch 0.3mg /24 hour, hydralazine 100mg  TID, imdur 60mg  daily, nifedipine xl 60mg  daily, and olmesartan 40mg  daily.  GI bleeding: hg is stable ~10. Followed by GI in the outpatient setting. She has not been on antiplatelets presumably due to previous GI bleeding. Will start a baby aspirin 81 mg daily and hopefully she tolerates this okay.   Chronic low dose steroid therapy: unclear reason for this, but she was continued on prednisone 5mg  daily.  ____________  Discharge Vitals Blood pressure (!) 123/52, pulse 74, temperature 99 F (37.2 C), temperature source Oral, resp. rate 20, height 5\' 1"  (1.549 m), weight 55.8 kg, SpO2 99%.  Filed Weights   12/23/22 1124 12/24/22 0500  Weight: 58.2 kg 55.8 kg    GEN: Well nourished, well developed, in no acute distress HEENT: normal Neck: no JVD or masses Cardiac: RRR; 3/6 flow murmur. No rubs, or gallops,no edema  Respiratory: clear  to auscultation bilaterally, normal work of breathing GI: soft, nontender, nondistended, + BS MS: no deformity or atrophy Skin: warm and dry, no rash.  Groin sites clear without hematoma or ecchymosis. Had some mild oozing on right groin but bandage is currently clean. Neuro:  Alert and Oriented x 3, Strength and sensation are intact Psych: euthymic mood, full affect  Disposition   Pt is being discharged home today in good condition.  Follow-up Plans & Appointments     Follow-up Information     Janetta Hora, PA-C. Go on 12/31/2022.   Specialties: Cardiology, Radiology Why: @ 2:20pm, please arrive at least 10 minutes early. Contact information: [address] [place] [address] [phone]                  Discharge Medications   Allergies as of 12/24/2022       Reactions   3-methyl-2-benzothiazolinone Hydrazone Other (See Comments)   Muscle weakness muscle cramping in legs Other reaction(s): Myalgias (Muscle Pain)   Banana Anaphylaxis, Swelling, Other (See Comments)   TONGUE AND MOUTH SWELLING   Black Walnut Flavor Anaphylaxis, Itching   Walnuts   Hazelnut (filbert) Anaphylaxis   Hazelnuts   Leflunomide And Related Other (See Comments)   Severe Headache   Lisinopril Swelling   Angioedema  Swelling of the face   No Healthtouch Food Allergies Anaphylaxis   Spicy Mustard 4.7.2021 Pt reports that she eats Regular Yellow Mustard Swelling and itching of tongue   Other Anaphylaxis, Swelling, Other (See Comments)   Cosentyx= angioedema Mouth itches and tongue swelling Hazel nuts and pecans also Walnuts Renal failure Serotonin syndrome  Tremors Muscle weakness muscle cramping in legs Other reaction(s): Myalgias (Muscle Pain)   Pecan Nut (diagnostic) Anaphylaxis, Itching   Pecans   Trazodone And Nefazodone Other (See Comments)   Serotonin Syndrome  Tremors   Adalimumab Other (See Comments)   Blisters on abdomen =humira    Escitalopram Oxalate Other (See Comments)   Hand problems - Serotonin Syndrome    Ezetimibe Other (See Comments)   myalgias cramps   Secukinumab  Swelling   Cosentyx = angiodema   Statins Other (See Comments)   Leg pains and weakness   Ferrlecit [na Ferric Gluc Cplx In Sucrose]    Not reaction given from HD   Gabapentin Other (See Comments)   tremors   Nsaids    Avoid due to renal problems         Medication List     TAKE these medications    acetaminophen 500 MG tablet Commonly known as: TYLENOL Take 1,000 mg by mouth daily as needed (pain).   albuterol (2.5 MG/3ML) 0.083% nebulizer solution Commonly known as: PROVENTIL Take 3 mLs (2.5 mg total) by nebulization every 6 (six) hours as needed for wheezing or shortness of breath.   Ventolin HFA 108 (90 Base) MCG/ACT inhaler Generic drug: albuterol Inhale 2 puffs into the lungs See admin instructions. Take 2 puffs every 4-6 hours as needed for wheezing and shortness of breath   allopurinol 100 MG tablet Commonly known as: ZYLOPRIM Take 100 mg by mouth every evening.   aspirin 81 MG chewable tablet Chew 1 tablet (81 mg total) by mouth daily. Start taking on: December 25, 2022   carboxymethylcellulose 1 % ophthalmic solution Place 1 drop into both eyes 3 (three) times daily.   carvedilol 25 MG tablet Commonly known as: COREG Take 25 mg by mouth 2 (two) times daily with a meal.   cetirizine 10 MG tablet Commonly known as: ZYRTEC Take 10 mg by mouth daily as needed for allergies.   cinacalcet 30 MG tablet Commonly known as: SENSIPAR Take 60 mg by mouth every Monday, Wednesday, and Friday with hemodialysis.   cloNIDine 0.3 mg/24hr patch Commonly known as: CATAPRES - Dosed in mg/24 hr Place 0.3 mg onto the skin every Sunday.   diclofenac Sodium 1 % Gel Commonly known as: VOLTAREN Apply 2 g topically daily as needed (pain).   docusate sodium 100 MG capsule Commonly known as: COLACE Take 100 mg by mouth  daily.   EPINEPHrine 0.3 mg/0.3 mL Soaj injection Commonly known as: EPI-PEN Inject into the muscle as needed for anaphylaxis.   fluticasone 50 MCG/ACT nasal spray Commonly known as: FLONASE Place 1 spray into both nostrils daily as needed for allergies or rhinitis.   hydrALAZINE 100 MG tablet Commonly known as: APRESOLINE Take 1 tablet (100 mg total) by mouth every 8 (eight) hours.   isosorbide mononitrate 60 MG 24 hr tablet Commonly known as: IMDUR Take 1 tablet (60 mg total) by mouth daily.   LORazepam 0.5 MG tablet Commonly known as: ATIVAN Take 0.5 mg by mouth every 8 (eight) hours as needed for anxiety. What changed: Another medication with the same name was removed. Continue taking this medication, and follow the directions you see here.   montelukast 10 MG tablet Commonly known as: SINGULAIR Take 10 mg by mouth daily with supper.   multivitamin Tabs tablet Take 1 tablet by mouth every Monday, Wednesday, and Friday.   neomycin-polymyxin b-dexamethasone 3.5-10000-0.1 Susp Commonly known as: MAXITROL Place 1 drop into both eyes in the morning, at noon, in the evening, and at bedtime.   NIFEdipine 60 MG 24 hr tablet Commonly known as: PROCARDIA XL/NIFEDICAL XL Take 60 mg by mouth 2 (two) times daily.   olmesartan 40 MG tablet Commonly known as: BENICAR Take 40 mg by mouth every evening.   ondansetron 4 MG tablet Commonly known as: ZOFRAN Take 4 mg by mouth 2 (two) times daily as needed for nausea or vomiting.   OXYGEN Inhale  2-3 L into the lungs continuous.   pantoprazole 40 MG tablet Commonly known as: PROTONIX Take 40 mg by mouth 2 (two) times daily.   predniSONE 5 MG tablet Commonly known as: DELTASONE Take 1 tablet (5 mg total) by mouth daily with breakfast.   PROBIOTIC PO Take 1 capsule by mouth daily.   Proctozone-HC 2.5 % rectal cream Generic drug: hydrocortisone Place rectally 2 (two) times daily.   silver sulfADIAZINE 1 % cream Commonly  known as: SILVADENE Apply 1 Application topically daily as needed (wound care).   Soothe Nighttime Oint Place 1 Application into both eyes at bedtime.   Stiolto Respimat 2.5-2.5 MCG/ACT Aers Generic drug: Tiotropium Bromide-Olodaterol Inhale 2 puffs once daily into the lungs.   TEARS NATURALE OP Place 1 drop into both eyes as needed (dry eye).   Vitamin D 50 MCG (2000 UT) tablet Take 2,000 Units by mouth daily.   zolpidem 5 MG tablet Commonly known as: AMBIEN Take 5 mg by mouth at bedtime as needed for sleep.          Outstanding Labs/Studies   none  Duration of Discharge Encounter   Greater than 30 minutes including physician time.  Byrd Hesselbach, PA-C 12/24/2022, 10:21 AM [phone]

## 2022-12-24 ENCOUNTER — Encounter (HOSPITAL_COMMUNITY): Payer: Self-pay | Admitting: Internal Medicine

## 2022-12-24 ENCOUNTER — Inpatient Hospital Stay (HOSPITAL_COMMUNITY): Payer: Medicare PPO

## 2022-12-24 ENCOUNTER — Inpatient Hospital Stay (HOSPITAL_COMMUNITY)
Admit: 2022-12-24 | Discharge: 2022-12-24 | Disposition: A | Discharge status: Home / Self Care | Payer: Medicare PPO | Attending: Physician Assistant | Admitting: Physician Assistant

## 2022-12-24 DIAGNOSIS — Z952 Presence of prosthetic heart valve: Secondary | ICD-10-CM

## 2022-12-24 DIAGNOSIS — I447 Left bundle-branch block, unspecified: Secondary | ICD-10-CM | POA: Insufficient documentation

## 2022-12-24 LAB — ECHOCARDIOGRAM COMPLETE
AR max vel: 1.4 cm2
AV Area VTI: 1.38 cm2
AV Area mean vel: 1.41 cm2
AV Mean grad: 40 mm[Hg]
AV Peak grad: 74 mm[Hg]
Ao pk vel: 4.3 m/s
Area-P 1/2: 3.16 cm2
Height: 61 in
S' Lateral: 2.7 cm
Weight: 1968.27 [oz_av]

## 2022-12-24 LAB — CBC
HCT: 33.9 % — ABNORMAL LOW (ref 36.0–46.0)
Hemoglobin: 9.9 g/dL — ABNORMAL LOW (ref 12.0–15.0)
MCH: 31.8 pg (ref 26.0–34.0)
MCHC: 29.2 g/dL — ABNORMAL LOW (ref 30.0–36.0)
MCV: 109 fL — ABNORMAL HIGH (ref 80.0–100.0)
Platelets: 156 10*3/uL (ref 150–400)
RBC: 3.11 MIL/uL — ABNORMAL LOW (ref 3.87–5.11)
RDW: 16 % — ABNORMAL HIGH (ref 11.5–15.5)
WBC: 6.1 10*3/uL (ref 4.0–10.5)
nRBC: 0 % (ref 0.0–0.2)

## 2022-12-24 LAB — BASIC METABOLIC PANEL
Anion gap: 8 (ref 5–15)
BUN: 32 mg/dL — ABNORMAL HIGH (ref 8–23)
CO2: 24 mmol/L (ref 22–32)
Calcium: 8.3 mg/dL — ABNORMAL LOW (ref 8.9–10.3)
Chloride: 101 mmol/L (ref 98–111)
Creatinine, Ser: 5.38 mg/dL — ABNORMAL HIGH (ref 0.44–1.00)
GFR, Estimated: 8 mL/min — ABNORMAL LOW (ref >60)
Glucose, Bld: 67 mg/dL — ABNORMAL LOW (ref 70–99)
Potassium: 3.6 mmol/L (ref 3.5–5.1)
Sodium: 133 mmol/L — ABNORMAL LOW (ref 135–145)

## 2022-12-24 LAB — MAGNESIUM: Magnesium: 1.8 mg/dL (ref 1.7–2.4)

## 2022-12-24 LAB — POCT ACTIVATED CLOTTING TIME
Activated Clotting Time: 216 s
Activated Clotting Time: 239 s

## 2022-12-24 LAB — HEPATITIS B SURFACE ANTIBODY, QUANTITATIVE: Hep B S AB Quant (Post): 18.2 m[IU]/mL

## 2022-12-24 LAB — GLUCOSE, CAPILLARY: Glucose-Capillary: 114 mg/dL — ABNORMAL HIGH (ref 70–99)

## 2022-12-24 MED ORDER — HEPARIN SODIUM (PORCINE) 1000 UNIT/ML DIALYSIS
1000.0000 [IU] | INTRAMUSCULAR | Status: DC | PRN
Start: 2022-12-24 — End: 2022-12-24

## 2022-12-24 MED ORDER — PENTAFLUOROPROP-TETRAFLUOROETH EX AERO
1.0000 | INHALATION_SPRAY | CUTANEOUS | Status: DC | PRN
Start: 2022-12-24 — End: 2022-12-24

## 2022-12-24 MED ORDER — ANTICOAGULANT SODIUM CITRATE 4% (200MG/5ML) IV SOLN
5.0000 mL | Status: DC | PRN
Start: 2022-12-24 — End: 2022-12-24

## 2022-12-24 MED ORDER — ASPIRIN 81 MG PO CHEW: 81.0000 mg | CHEWABLE_TABLET | Freq: Every day | ORAL | Status: DC

## 2022-12-24 MED ORDER — LIDOCAINE HCL (PF) 1 % IJ SOLN
5.0000 mL | INTRAMUSCULAR | Status: DC | PRN
Start: 2022-12-24 — End: 2022-12-24

## 2022-12-24 MED ORDER — ALTEPLASE 2 MG IJ SOLR: 2.0000 mg | Freq: Once | INTRAMUSCULAR | Status: DC | PRN

## 2022-12-24 MED ORDER — ALBUTEROL SULFATE (2.5 MG/3ML) 0.083% IN NEBU
2.5000 mg | INHALATION_SOLUTION | Freq: Four times a day (QID) | RESPIRATORY_TRACT | Status: DC | PRN
  Administered 2022-12-24 – 2022-12-25 (×2): 2.5 mg via RESPIRATORY_TRACT
  Filled 2022-12-24 (×2): qty 3

## 2022-12-24 MED ORDER — LIDOCAINE-PRILOCAINE 2.5-2.5 % EX CREA
1.0000 | TOPICAL_CREAM | CUTANEOUS | Status: DC | PRN
Start: 2022-12-24 — End: 2022-12-24

## 2022-12-24 NOTE — Progress Notes (Addendum)
Mobility Specialist Progress Note:   12/24/22 1523  Mobility  Activity Transferred from chair to bed  Level of Assistance Standby assist, set-up cues, supervision of patient - no hands on  Assistive Device None  Distance Ambulated (ft) 3 ft  Activity Response Tolerated well  Mobility Referral Yes  $Mobility charge 1 Mobility  Mobility Specialist Start Time (ACUTE ONLY) 1515  Mobility Specialist Stop Time (ACUTE ONLY) 1523  Mobility Specialist Time Calculation (min) (ACUTE ONLY) 8 min   Pt received in chair requesting to transfer back to bed. Requesting linen sheet over bed mat d/t fabric being uncomfortable, otherwise no other complaints mentioned. Pt left in supine position with call bell in reach and all needs met. RN notified.  Leory Plowman  Mobility Specialist Please contact via [employer] office at [phone]

## 2022-12-24 NOTE — Progress Notes (Signed)
Pt receives out-pt HD at [facility] on MWF. Will assist as needed.   Melven Sartorius Renal Navigator [phone]

## 2022-12-24 NOTE — Progress Notes (Addendum)
[facility]   MULTIDISCIPLINARY HEART VALVE TEAM  Patient Name: Jodi Bell Date of Encounter: 12/24/2022  Admit date: 12/23/2022  Primary Care Provider: Malka So., MD [facility] Cardiologist: Donato Schultz, MD  [facility] Electrophysiologist:  None   Hospital Problem List     Principal Problem:   S/P TAVR (transcatheter aortic valve replacement) Active Problems:   Diabetes mellitus with coincident hypertension (HCC)   Essential hypertension   COPD (chronic obstructive pulmonary disease) (HCC)   ESRD (end stage renal disease) (HCC)   S/P CABG x 2   Acute on chronic diastolic heart failure (HCC)   Paroxysmal atrial fibrillation (HCC)   Severe aortic stenosis   LBBB (left bundle branch block)     Subjective   Complains of buttox pain and SOB.   Inpatient Medications    Scheduled Meds:  allopurinol  100 mg Oral QPM   aspirin  81 mg Oral Daily   Chlorhexidine Gluconate Cloth  6 each Topical Q0600   cinacalcet  60 mg Oral Q M,W,F-HD   docusate sodium  100 mg Oral Daily   irbesartan  300 mg Oral Daily   isosorbide mononitrate  60 mg Oral Daily   loratadine  10 mg Oral Daily   montelukast  10 mg Oral Q supper   NIFEdipine  60 mg Oral BID   pantoprazole  40 mg Oral BID   predniSONE  5 mg Oral Q breakfast   sodium chloride flush  3 mL Intravenous Q12H   Continuous Infusions:  sodium chloride     nitroGLYCERIN Stopped (12/23/22 1804)   PRN Meds: sodium chloride, acetaminophen **OR** acetaminophen, albuterol, LORazepam, morphine injection, ondansetron (ZOFRAN) IV, oxyCODONE, sodium chloride flush, traMADol, zolpidem   Vital Signs    Vitals:   12/24/22 0300 12/24/22 0400 12/24/22 0500 12/24/22 0735  BP: (!) 161/61 (!) 147/61 (!) 159/60 (!) 123/52  Pulse: 70 73 73 74  Resp: 20 15 18 20   Temp:  97.8 F (36.6 C)  99 F (37.2 C)  TempSrc:  Oral  Oral  SpO2: 99% 100% 99% 99%  Weight:   55.8 kg   Height:        Intake/Output  Summary (Last 24 hours) at 12/24/2022 1117 Last data filed at 12/24/2022 0303 Gross per 24 hour  Intake 695 ml  Output 0 ml  Net 695 ml   Filed Weights   12/23/22 1124 12/24/22 0500  Weight: 58.2 kg 55.8 kg    Physical Exam    GEN: appears uncomfortable HEENT: Grossly normal.  Neck: Supple, no JVD, carotid bruits, or masses. Cardiac: RRR,4/6 systolic murmur. No rubs, or gallops. No clubbing, cyanosis, edema.   Respiratory:  Respirations regular and unlabored, clear to auscultation bilaterally. GI: Soft, nontender, nondistended, BS + x 4. MS: no deformity or atrophy. Skin: warm and dry, no rash. Groin sites clear without hematoma or ecchymosis. Had some mild oozing on right groin but bandage is currently clean  Neuro:  Strength and sensation are intact. Psych: AAOx3.  Normal affect.  Labs    CBC Recent Labs    12/23/22 1636 12/24/22 0349  WBC  --  6.1  HGB 9.9* 9.9*  HCT 29.0* 33.9*  MCV  --  109.0*  PLT  --  156   Basic Metabolic Panel Recent Labs    04/54/09 1636 12/24/22 0349  NA 141 133*  K 3.5 3.6  CL 105 101  CO2  --  24  GLUCOSE 78 67*  BUN 30* 32*  CREATININE 3.60* 5.38*  CALCIUM  --  8.3*  MG  --  1.8   Liver Function Tests No results for input(s): "AST", "ALT", "ALKPHOS", "BILITOT", "PROT", "ALBUMIN" in the last 72 hours. No results for input(s): "LIPASE", "AMYLASE" in the last 72 hours. Cardiac Enzymes No results for input(s): "CKTOTAL", "CKMB", "CKMBINDEX", "TROPONINI" in the last 72 hours. BNP Invalid input(s): "POCBNP" D-Dimer No results for input(s): "DDIMER" in the last 72 hours. Hemoglobin A1C No results for input(s): "HGBA1C" in the last 72 hours. Fasting Lipid Panel No results for input(s): "CHOL", "HDL", "LDLCALC", "TRIG", "CHOLHDL", "LDLDIRECT" in the last 72 hours. Thyroid Function Tests No results for input(s): "TSH", "T4TOTAL", "T3FREE", "THYROIDAB" in the last 72 hours.  Invalid input(s): "FREET3"  Telemetry    sinus -  Personally Reviewed  ECG    Sinus with new LBBB , HR 73 - Personally Reviewed  Radiology    ECHOCARDIOGRAM COMPLETE  Result Date: 12/24/2022    ECHOCARDIOGRAM REPORT   Patient Name:   Jodi Bell Date of Exam: 12/24/2022 Medical Rec #:  [id number]        Height:       61.0 in Accession #:    [id number]       Weight:       123.0 lb Date of Birth:  07/19/1960         BSA:          1.536 m Patient Age:    62 years         BP:           140/58 mmHg Patient Gender: F                HR:           72 bpm. Exam Location:  Inpatient Procedure: 2D Echo, Color Doppler and Cardiac Doppler Indications:    Post TAVR  History:        Patient has prior history of Echocardiogram examinations, most                 recent 12/23/2022. CAD, Prior CABG, COPD and ESRD, #23 Stephannie Peters TAVR on 12/23/22, Arrythmias:Atrial Fibrillation; Risk                 Factors:Sleep Apnea, Hypertension and Diabetes.  Sonographer:    Milbert Coulter Referring Phys: [id number] KATHRYN R THOMPSON IMPRESSIONS  1. 23 mm S3. Mild PVL. Vmax 4.3 m/s, MG 40 mm HG, AVA 1.38 cm2. LVOT VTI eleavted at 38 cm (likely over estimated) with SV 119 cc. Valve reported to be under expanded. Some of the increased gradient could be flow related, but also there is concerns for prosthetic valve stenosis. The aortic valve has been repaired/replaced. Aortic valve regurgitation is mild. Procedure Date: 12/23/2022. Echo findings are consistent with stenosis and perivalvular leak of the aortic prosthesis.  2. Left ventricular ejection fraction, by estimation, is 65 to 70%. The left ventricle has normal function. The left ventricle has no regional wall motion abnormalities. There is severe concentric left ventricular hypertrophy. Left ventricular diastolic  function could not be evaluated. There is the interventricular septum is flattened in systole and diastole, consistent with right ventricular pressure and volume overload.  3. Right ventricular systolic  function is moderately reduced. The right ventricular size is moderately enlarged. There is severely elevated pulmonary artery systolic pressure. The estimated right  ventricular systolic pressure is 73.1 mmHg.  4. Left atrial size was severely dilated.  5. Right atrial size was mildly dilated.  6. The mitral valve is degenerative. Mild mitral valve regurgitation. Moderate mitral stenosis. The mean mitral valve gradient is 9.0 mmHg with average heart rate of 71 bpm. Moderate to severe mitral annular calcification.  7. The tricuspid valve is abnormal. 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  8. The inferior vena cava is dilated in size with <50% respiratory variability, suggesting right atrial pressure of 15 mmHg. Comparison(s): Changes from prior study are noted. FINDINGS  Left Ventricle: Left ventricular ejection fraction, by estimation, is 65 to 70%. The left ventricle has normal function. The left ventricle has no regional wall motion abnormalities. The left ventricular internal cavity size was normal in size. There is  severe concentric left ventricular hypertrophy. The interventricular septum is flattened in systole and diastole, consistent with right ventricular pressure and volume overload. Left ventricular diastolic function could not be evaluated due to mitral annular calcification (moderate or greater). Left ventricular diastolic function could not be evaluated. Right Ventricle: The right ventricular size is moderately enlarged. No increase in right ventricular wall thickness. Right ventricular systolic function is moderately reduced. There is severely elevated pulmonary artery systolic pressure. The tricuspid regurgitant velocity is 3.81 m/s, and with an assumed right atrial pressure of 15 mmHg, the estimated right ventricular systolic pressure is 73.1 mmHg. Left Atrium: Left atrial size was severely dilated. Right Atrium: Right atrial size was mildly dilated. Pericardium: There is no evidence  of pericardial effusion. Mitral Valve: The mitral valve is degenerative in appearance. Moderate to severe mitral annular calcification. Mild mitral valve regurgitation. Moderate mitral valve stenosis. MV peak gradient, 22.5 mmHg. The mean mitral valve gradient is 9.0 mmHg with average heart rate of 71 bpm. Tricuspid Valve: The tricuspid valve is abnormal. 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 No evidence of tricuspid stenosis. Aortic Valve: 23 mm S3. Mild PVL. Vmax 4.3 m/s, MG 40 mm HG, AVA 1.38 cm2. LVOT VTI eleavted at 38 cm (likely over estimated) with SV 119 cc. Valve reported to be under expanded. Some of the increased gradient could be flow related, but also there is concerns for prosthetic valve stenosis. The aortic valve has been repaired/replaced. Aortic valve regurgitation is mild. Aortic valve mean gradient measures 40.0 mmHg. Aortic valve peak gradient measures 74.0 mmHg. Aortic valve area, by VTI measures 1.38  cm. There is a 23 mm Sapien prosthetic, stented (TAVR) valve present in the aortic position. Pulmonic Valve: The pulmonic valve was grossly normal. Pulmonic valve regurgitation is mild. No evidence of pulmonic stenosis. Aorta: The aortic root and ascending aorta are structurally normal, with no evidence of dilitation. Venous: The inferior vena cava is dilated in size with less than 50% respiratory variability, suggesting right atrial pressure of 15 mmHg. IAS/Shunts: The atrial septum is grossly normal.  LEFT VENTRICLE PLAX 2D LVIDd:         4.20 cm   Diastology LVIDs:         2.70 cm   LV e' medial:    9.25 cm/s LV PW:         1.60 cm   LV E/e' medial:  21.0 LV IVS:        1.40 cm   LV e' lateral:   8.92 cm/s LVOT diam:     1.98 cm   LV E/e' lateral: 21.7 LV SV:         119  LV SV Index:   77 LVOT Area:     3.08 cm  RIGHT VENTRICLE RV Basal diam:  4.00 cm RV Mid diam:    3.00 cm RV S prime:     7.72 cm/s TAPSE (M-mode): 1.4 cm LEFT ATRIUM              Index        RIGHT ATRIUM            Index LA diam:        4.10 cm  2.67 cm/m   RA Area:     20.30 cm LA Vol (A2C):   106.0 ml 69.00 ml/m  RA Volume:   52.30 ml  34.04 ml/m LA Vol (A4C):   87.6 ml  57.02 ml/m LA Biplane Vol: 96.4 ml  62.75 ml/m  AORTIC VALVE AV Area (Vmax):    1.40 cm AV Area (Vmean):   1.41 cm AV Area (VTI):     1.38 cm AV Vmax:           430.00 cm/s AV Vmean:          288.667 cm/s AV VTI:            0.862 m AV Peak Grad:      74.0 mmHg AV Mean Grad:      40.0 mmHg LVOT Vmax:         196.00 cm/s LVOT Vmean:        132.000 cm/s LVOT VTI:          0.386 m LVOT/AV VTI ratio: 0.45  AORTA Ao Root diam: 2.90 cm Ao Asc diam:  3.60 cm MITRAL VALVE                TRICUSPID VALVE MV Area (PHT): 3.16 cm     TR Peak grad:   58.1 mmHg MV Peak grad:  22.5 mmHg    TR Vmax:        381.00 cm/s MV Mean grad:  9.0 mmHg MV Vmax:       2.37 m/s     SHUNTS MV Vmean:      146.0 cm/s   Systemic VTI:  0.39 m MV Decel Time: 240 msec     Systemic Diam: 1.98 cm MV E velocity: 194.00 cm/s MV A velocity: 129.00 cm/s MV E/A ratio:  1.50 Lennie Odor MD Electronically signed by Lennie Odor MD Signature Date/Time: 12/24/2022/11:16:58 AM    Final    ECHOCARDIOGRAM LIMITED  Result Date: 12/23/2022    ECHOCARDIOGRAM LIMITED REPORT   Patient Name:   DAESIA CREEDEN Date of Exam: 12/23/2022 Medical Rec #:  [id number]        Height:       61.0 in Accession #:    [id number]       Weight:       128.3 lb Date of Birth:  Jul 18, 1960         BSA:          1.564 m Patient Age:    62 years         BP:           191/55 mmHg Patient Gender: F                HR:           66 bpm. Exam Location:  Inpatient Procedure: Limited Echo, Cardiac Doppler and Color Doppler Indications:     I35.0 Nonrheumatic aortic (valve) stenosis  History:  Patient has prior history of Echocardiogram examinations, most                  recent 12/09/2022.                  Aortic Valve: 23 mm Edwards Sapien prosthetic, stented (TAVR)                  valve is present in the aortic  position. Procedure Date:                  12/23/2022.  Sonographer:     Sheralyn Boatman RDCS Referring Phys:  [id number] Charlies Constable [facility] Diagnosing Phys: Charlton Haws MD IMPRESSIONS  1. Left ventricular ejection fraction, by estimation, is 55 to 60%. The left ventricle has normal function. There is mild left ventricular hypertrophy.  2. Right ventricular systolic function is moderately reduced. The right ventricular size is moderately enlarged.  3. Left atrial size was severely dilated.  4. Right atrial size was severely dilated.  5. The mitral valve is degenerative. Mild mitral valve regurgitation. Severe mitral annular calcification.  6. Tricuspid valve regurgitation is severe.  7. Pre TAVR: severely calcified tri leaflet with restricted motion mean gradient 34 peak 60 mmhg AVA 1 cm2         Post TAVR: 23 mm Sapien 3 valve Somewhat deep deployment 70/30. Purposefully under inflated by 2 cc due to concern for annular rupture. No PVL Elevated gradients as expected with under inflation. mean 17 peak 33 mmHg AVA 1.9 cm2. The aortic valve has  been repaired/replaced. There is a 23 mm Edwards Sapien prosthetic (TAVR) valve present in the aortic position. Procedure Date: 12/23/2022.  8. Aortic dilatation noted. There is mild dilatation of the aortic root, measuring 39 mm. FINDINGS  Left Ventricle: Left ventricular ejection fraction, by estimation, is 55 to 60%. The left ventricle has normal function. There is mild left ventricular hypertrophy. Right Ventricle: The right ventricular size is moderately enlarged. Right ventricular systolic function is moderately reduced. Left Atrium: Left atrial size was severely dilated. Right Atrium: Right atrial size was severely dilated. Pericardium: There is no evidence of pericardial effusion. Mitral Valve: The mitral valve is degenerative in appearance. There is moderate thickening of the mitral valve leaflet(s). There is moderate calcification of the mitral valve leaflet(s). Severe mitral  annular calcification. Mild mitral valve regurgitation. Tricuspid Valve: Tricuspid valve regurgitation is severe. Aortic Valve: Pre TAVR: severely calcified tri leaflet with restricted motion mean gradient 34 peak 60 mmhg AVA 1 cm2 Post TAVR: 23 mm Sapien 3 valve Somewhat deep deployment 70/30. Purposefully under inflated by 2 cc due to concern for annular rupture. No PVL Elevated gradients as expected with under inflation. mean 17 peak 33 mmHg AVA 1.9 cm2. The aortic valve has been repaired/replaced. Aortic valve mean gradient measures 15.3 mmHg. Aortic valve peak gradient measures 30.8 mmHg. Aortic valve area, by VTI measures 1.95 cm. There is a 23 mm Edwards Sapien prosthetic, stented (TAVR) valve present in the aortic position. Procedure Date: 12/23/2022. Aorta: Aortic dilatation noted. There is mild dilatation of the aortic root, measuring 39 mm. IAS/Shunts: The interatrial septum is aneurysmal. Additional Comments: Spectral Doppler performed. Color Doppler performed.  LEFT VENTRICLE PLAX 2D LVIDd:         4.30 cm LVIDs:         2.80 cm LV PW:         1.30 cm LV IVS:  1.30 cm LVOT diam:     2.21 cm LV SV:         124 LV SV Index:   79 LVOT Area:     3.85 cm  AORTIC VALVE AV Area (Vmax):    1.85 cm AV Area (Vmean):   1.89 cm AV Area (VTI):     1.95 cm AV Vmax:           277.33 cm/s AV Vmean:          179.000 cm/s AV VTI:            0.638 m AV Peak Grad:      30.8 mmHg AV Mean Grad:      15.3 mmHg LVOT Vmax:         133.00 cm/s LVOT Vmean:        87.800 cm/s LVOT VTI:          0.323 m LVOT/AV VTI ratio: 0.51  AORTA Ao Asc diam: 3.80 cm TRICUSPID VALVE TR Peak grad:   37.7 mmHg TR Vmax:        307.00 cm/s  SHUNTS Systemic VTI:  0.32 m Systemic Diam: 2.21 cm Charlton Haws MD Electronically signed by Charlton Haws MD Signature Date/Time: 12/23/2022/3:36:40 PM    Final    Structural Heart Procedure  Result Date: 12/23/2022 See surgical note for result.   Cardiac Studies   Echo 12/24/22 IMPRESSIONS    1. 23 mm S3. Mild PVL. Vmax 4.3 m/s, MG 40 mm HG, AVA 1.38 cm2. LVOT VTI  eleavted at 38 cm (likely over estimated) with SV 119 cc. Valve reported  to be under expanded. Some of the increased gradient could be flow  related, but also there is concerns for  prosthetic valve stenosis. The aortic valve has been repaired/replaced.  Aortic valve regurgitation is mild. Procedure Date: 12/23/2022. Echo  findings are consistent with stenosis and perivalvular leak of the aortic  prosthesis.   2. Left ventricular ejection fraction, by estimation, is 65 to 70%. The  left ventricle has normal function. The left ventricle has no regional  wall motion abnormalities. There is severe concentric left ventricular  hypertrophy. Left ventricular diastolic   function could not be evaluated. There is the interventricular septum is  flattened in systole and diastole, consistent with right ventricular  pressure and volume overload.   3. Right ventricular systolic function is moderately reduced. The right  ventricular size is moderately enlarged. There is severely elevated  pulmonary artery systolic pressure. The estimated right ventricular  systolic pressure is 73.1 mmHg.   4. Left atrial size was severely dilated.   5. Right atrial size was mildly dilated.   6. The mitral valve is degenerative. Mild mitral valve regurgitation.  Moderate mitral stenosis. The mean mitral valve gradient is 9.0 mmHg with  average heart rate of 71 bpm. Moderate to severe mitral annular  calcification.   7. The tricuspid valve is abnormal. Tricuspid valve regurgitation is  moderate to severe.   8. The inferior vena cava is dilated in size with <50% respiratory  variability, suggesting right atrial pressure of 15 mmHg.   Comparison(s): Changes from prior study are noted.   Patient Profile     SHOLA GOCHEZ is a 62 y.o. female with a history of CAD s/p CABGx2V (LIMA-->LAD, SVG-->PDA in 2022 by Dr. Laneta Simmers), ESRD on HD (MWF),  former tobacco abuse, chronic respiratory failure on home 02, diet controlled DMT2, HTN, GI bleeding, and critical AS who presented to  [facility] on 12/23/22 for planned TAVR.   Assessment & Plan    Critical AS: s/p successful TAVR with a 23 mm Edwards Sapien 3 THV via the TF approach on 12/23/22. Post operative echo showed EF 65%, severe LVH, moderate RV dysfunction, moderate to severe MAC with mild MR/mod MS, severe TR and s/p TAVR with a mean gradient of 40 mmHg and mild PVL. LVOT VTI elevated at 38 cm (likely over estimated) with SV 119 cc. Valve reported to be under expanded. Some of the increased gradient could be flow related, but also there is concerns for prosthetic valve stenosis. Will have her get dialyzed and repeat echo. Groin sites are stable. Started on a baby aspirin 81mg  daily.   New LBBB: pt developed a new LBBB after TAVR. She has had no evidence of HAVB. Zio patch placed in anticipation of DC home today, but due to delays in HD, she will require overnight admission.    Acute on chronic HFpEF: has evidence of volume overload with an LVEDP 34 mm hg at the time of TAVR and echo findings. She is having shortness of breath. Volume management per nephrology.    ESRD on HD (MWF): continue regular schedule. Plan for HD today, but HD cannot dialyze her until 7pm tonight.   CAD s/p CABGx2V: [facility] 07/24/22 showed patent LIMA to LAD and occluded SVG to PDA with occluded LAD and mRCA with moderate disease of the mLCx. Started on a baby aspirin 81mg  daily as above. She is intolerant to statins.    Chronic respiratory failure on home 02: stable. Resume home 02.   HTN: BP well controlled. Resumed on home some home meds. BP currently under good control.   GI bleeding: hg is stable ~10. Followed by GI in the outpatient setting. She has not been on antiplatelets presumably due to previous GI bleeding. Will start a baby aspirin 81 mg daily and hopefully she tolerates this okay.    Chronic low dose  steroid therapy: unclear reason for this, but she was continued on prednisone 5mg  daily.   Signed, Cline Crock, PA-C  12/24/2022, 11:17 AM  Pager [phone]   ATTENDING ATTESTATION:  After conducting a review of all available clinical information with the care team, interviewing the patient, and performing a physical exam, I agree with the findings and plan described in this note.   GEN: No acute distress.   HEENT:  MMM, JVD ~10cm, no scleral icterus Cardiac: RRR, with  3/6 SEM Respiratory: Clear to auscultation bilaterally. GI: Soft, nontender, non-distended  MS: No edema; No deformity. Neuro:  Nonfocal  Vasc:  +2 radial pulses; access sites intact  Patient is status post TAVR after a 23 mm SAPIEN 3 valve with intentional underexpansion given a heavily calcified annulus.  The patient's LVEDP during the procedure was 34 mmHg.  She complains of shortness of breath this morning.  She is scheduled for dialysis.  Her echocardiogram did show a high mean gradient.  I am wondering whether some of this is due to a high flow state with the presence of a fistula and relative anemia in the presence of an intentionally underexpanded valve.  Our options are limited in regards to further percutaneous therapy in this individual.  I think the best thing to do is to refer her for hemodialysis and hopefully a good amount of fluid can be removed.  She is scheduled for dialysis later this evening.  Continue current therapy for now.  Alverda Skeans, MD Pager [phone]

## 2022-12-24 NOTE — Plan of Care (Signed)

## 2022-12-24 NOTE — Progress Notes (Signed)
Mobility Specialist Progress Note:   12/24/22 1410  Mobility  Activity Ambulated with assistance in hallway  Level of Assistance Contact guard assist, steadying assist  Assistive Device Front wheel walker  Distance Ambulated (ft) 110 ft  Activity Response Tolerated well  Mobility Referral Yes  $Mobility charge 1 Mobility  Mobility Specialist Start Time (ACUTE ONLY) 1124  Mobility Specialist Stop Time (ACUTE ONLY) 1135  Mobility Specialist Time Calculation (min) (ACUTE ONLY) 11 min   Pre Mobility: 72 HR , 91% SpO2 4 L  During Mobility: 78 HR ,  97% SpO2 4 L Post Mobility: 69 HR   Pt received in bed, agreeable to mobility. Groin incision sites stable. SB bed mobility. CG during ambulation for safety. Pt denied any discomfort during ambulation, asx throughout. Pt returned to bed with RN present in room.  Leory Plowman  Mobility Specialist Please contact via [employer] office at [phone]

## 2022-12-24 NOTE — Progress Notes (Signed)
Received patient in bed to unit.  Alert and oriented.  Informed consent signed and in chart.   TX duration: 3:00  Patient tolerated well.  Transported back to the room  Alert, without acute distress.  Hand-off given to patient's nurse.   Access used: LUE AVF Access issues: None  Total UF removed: 1000 mL Medication(s) given: Ativan 0.5 mg Post HD VS: please data insert Post HD wt: 58.5 kg (standing scale)    12/24/22 2025  Vitals  Temp 97.9 F (36.6 C)  Temp Source Oral  BP (!) 148/58  MAP (mmHg) 82  BP Location Right Arm  BP Method Automatic  Patient Position (if appropriate) Lying  Pulse Rate 66  Pulse Rate Source Monitor  ECG Heart Rate 67  Resp 18  Oxygen Therapy  SpO2 100 %  O2 Device Nasal Cannula  O2 Flow Rate (L/min) 3 L/min  Patient Activity (if Appropriate) In bed  Pulse Oximetry Type Continuous  Post Treatment  Dialyzer Clearance Lightly streaked  Hemodialysis Intake (mL) 0 mL  Liters Processed 72  Fluid Removed (mL) 1000 mL  Tolerated HD Treatment Yes  Post-Hemodialysis Comments Treatment completed and blood returned without issue.  AVG/AVF Arterial Site Held (minutes) 10 minutes (Gauze applied and secured with paper tape; hemostasis achieved.)  AVG/AVF Venous Site Held (minutes) 10 minutes (Gauze applied and secured with paper tape; hemostasis achieved.)  Note  Patient Observations Patient alert, no c/o voiced, no acute distress noted; patient condition stable for return transport.  Fistula / Graft Left Upper arm  Placement Date/Time: 05/17/18 0800   Orientation: Left  Access Location: Upper arm  Site Condition No complications  Fistula / Graft Assessment Present;Thrill;Bruit  Status Flushed;Patent;Deaccessed  Drainage Description None      [facility]

## 2022-12-24 NOTE — Anesthesia Postprocedure Evaluation (Signed)
Anesthesia Post Note  Patient: [name]  Procedure(s) Performed: Transcatheter Aortic Valve Replacement, Transfemoral INTRAOPERATIVE TRANSTHORACIC ECHOCARDIOGRAM     Patient location during evaluation: PACU Anesthesia Type: MAC Level of consciousness: awake and alert Pain management: pain level controlled Vital Signs Assessment: post-procedure vital signs reviewed and stable Respiratory status: spontaneous breathing, nonlabored ventilation, respiratory function stable and patient connected to nasal cannula oxygen Cardiovascular status: stable and blood pressure returned to baseline Postop Assessment: no apparent nausea or vomiting Anesthetic complications: no   There were no known notable events for this encounter.  Last Vitals:  Vitals:   12/24/22 0735 12/24/22 1200  BP: (!) 123/52 (!) 147/61  Pulse: 74 74  Resp: 20 19  Temp: 37.2 C 37.2 C  SpO2: 99% 99%    Last Pain:  Vitals:   12/24/22 1249  TempSrc:   PainSc: 2                  Nelle Don Hind Chesler

## 2022-12-24 NOTE — Transfer of Care (Signed)
Immediate Anesthesia Transfer of Care Note  Patient: Jodi Bell  Procedure(s) Performed: Transcatheter Aortic Valve Replacement, Transfemoral INTRAOPERATIVE TRANSTHORACIC ECHOCARDIOGRAM  Patient Location: Cath Lab  Anesthesia Type:MAC  Level of Consciousness: awake, alert , and oriented  Airway & Oxygen Therapy: Patient connected to nasal cannula oxygen  Post-op Assessment: Post -op Vital signs reviewed and stable  Post vital signs: stable  Last Vitals:  Vitals Value Taken Time  BP 119/53 12/24/22 0800  Temp 37.2 C 12/24/22 0735  Pulse 71 12/24/22 0920  Resp 19 12/24/22 0920  SpO2 100 % 12/24/22 0920  Vitals shown include unfiled device data.  Last Pain:  Vitals:   12/24/22 0735  TempSrc: Oral  PainSc:       Patients Stated Pain Goal: 0 (12/24/22 0535)  Complications: There were no known notable events for this encounter.

## 2022-12-24 NOTE — Progress Notes (Signed)
Pt just ambulated with MT however now c/o buttocks pain. Assisted pt to recliner, tolerated well. Discussed with pt restrictions, walking as tolerated, and CRPII. She has tried to do CRPII and pulmonary rehab in the past and is eager to do now. Will refer to [place] CRPII.  [id number] Ethelda Chick BS, ACSM-CEP 12/24/2022 2:55 PM

## 2022-12-24 NOTE — Discharge Summary (Incomplete)
[facility]   MULTIDISCIPLINARY HEART VALVE TEAM  Discharge Summary    Patient ID: Jodi Bell MRN: [id number]; DOB: September 04, 1960  Admit date: 12/23/2022 Discharge date: 12/25/2022  Primary Care Provider: Malka So., MD  Primary Cardiologist: Donato Schultz, MD  / Dr. Lynnette Caffey & Dr. Laneta Simmers (TAVR)   Discharge Diagnoses    Principal Problem:   S/P TAVR (transcatheter aortic valve replacement) Active Problems:   Diabetes mellitus with coincident hypertension (HCC)   Essential hypertension   COPD (chronic obstructive pulmonary disease) (HCC)   ESRD (end stage renal disease) (HCC)   S/P CABG x 2   Acute on chronic diastolic heart failure (HCC)   Paroxysmal atrial fibrillation (HCC)   Severe aortic stenosis   LBBB (left bundle branch block)   Allergies Allergies  Allergen Reactions   3-Methyl-2-Benzothiazolinone Hydrazone Other (See Comments)    Muscle weakness muscle cramping in legs Other reaction(s): Myalgias (Muscle Pain)   Banana Anaphylaxis, Swelling and Other (See Comments)    TONGUE AND MOUTH SWELLING   Black Walnut Flavor Anaphylaxis and Itching    Walnuts   Hazelnut (Filbert) Anaphylaxis    Hazelnuts   Leflunomide And Related Other (See Comments)    Severe Headache   Lisinopril Swelling    Angioedema  Swelling of the face   No Healthtouch Food Allergies Anaphylaxis    Spicy Mustard 4.7.2021 Pt reports that she eats Regular Yellow Mustard Swelling and itching of tongue   Other Anaphylaxis, Swelling and Other (See Comments)    Cosentyx= angioedema Mouth itches and tongue swelling Hazel nuts and pecans also Walnuts Renal failure Serotonin syndrome  Tremors Muscle weakness muscle cramping in legs Other reaction(s): Myalgias (Muscle Pain)   Pecan Nut (Diagnostic) Anaphylaxis and Itching    Pecans   Trazodone And Nefazodone Other (See Comments)    Serotonin Syndrome  Tremors   Adalimumab Other (See Comments)    Blisters on abdomen  =humira   Escitalopram Oxalate Other (See Comments)    Hand problems - Serotonin Syndrome     Ezetimibe Other (See Comments)    myalgias cramps    Secukinumab Swelling    Cosentyx = angiodema    Statins Other (See Comments)    Leg pains and weakness   Ferrlecit [Na Ferric Gluc Cplx In Sucrose]     Not reaction given from HD   Gabapentin Other (See Comments)    tremors   Nsaids     Avoid due to renal problems     Diagnostic Studies/Procedures    TAVR OPERATIVE NOTE     Date of Procedure:                12/23/2022   Preoperative Diagnosis:      Severe Aortic Stenosis    Postoperative Diagnosis:    Same    Procedure:        Transcatheter Aortic Valve Replacement - Percutaneous Right Transfemoral Approach             Edwards Sapien 3 Ultra Resilia THV (size 23 mm - 2cc, model # 9755RSL, serial # [id number])              Co-Surgeons:                        Alleen Borne, MD and Alverda Skeans, MD     Anesthesiologist:                  G.  Stoltzfus, DO   Echocardiographer:              Burna Forts, MD   Pre-operative Echo Findings: Severe aortic stenosis Normal left ventricular systolic function   Post-operative Echo Findings: No paravalvular leak Normal left ventricular systolic function _____________  Echo 12/24/22 IMPRESSIONS   1. 23 mm S3. Mild PVL. Vmax 4.3 m/s, MG 40 mm HG, AVA 1.38 cm2. LVOT VTI  eleavted at 38 cm (likely over estimated) with SV 119 cc. Valve reported  to be under expanded. Some of the increased gradient could be flow  related, but also there is concerns for  prosthetic valve stenosis. The aortic valve has been repaired/replaced.  Aortic valve regurgitation is mild. Procedure Date: 12/23/2022. Echo  findings are consistent with stenosis and perivalvular leak of the aortic  prosthesis.   2. Left ventricular ejection fraction, by estimation, is 65 to 70%. The  left ventricle has normal function. The left ventricle has no regional  wall  motion abnormalities. There is severe concentric left ventricular  hypertrophy. Left ventricular diastolic   function could not be evaluated. There is the interventricular septum is  flattened in systole and diastole, consistent with right ventricular  pressure and volume overload.   3. Right ventricular systolic function is moderately reduced. The right  ventricular size is moderately enlarged. There is severely elevated  pulmonary artery systolic pressure. The estimated right ventricular  systolic pressure is 73.1 mmHg.   4. Left atrial size was severely dilated.   5. Right atrial size was mildly dilated.   6. The mitral valve is degenerative. Mild mitral valve regurgitation.  Moderate mitral stenosis. The mean mitral valve gradient is 9.0 mmHg with  average heart rate of 71 bpm. Moderate to severe mitral annular  calcification.   7. The tricuspid valve is abnormal. Tricuspid valve regurgitation is  moderate to severe.   8. The inferior vena cava is dilated in size with <50% respiratory  variability, suggesting right atrial pressure of 15 mmHg.   Comparison(s): Changes from prior study are noted.     History of Present Illness     Jodi Bell is a 62 y.o. female with a history of CAD s/p CABGx2V (LIMA-->LAD, SVG-->PDA in 2022 by Dr. Laneta Simmers), ESRD on HD (MWF), former tobacco abuse, chronic respiratory failure on home 02, diet controlled DMT2, HTN, GI bleeding, and critical AS who presented to [facility] on 12/23/22 for planned TAVR.   Pt has a long history of GI bleeding. Capsule endoscopy in 07/2021 showed blood in small bowel of unclear source and enteroscopy done to f/u capsule endo did not show any blood products or bleeding. CT abdomen pelvis with contrast 02/25/2022 done for LLQ pain showed small volume abdominal ascites along with mesenteric edema and diffuse body wall edema, diverticulosis, stable large vascular clips in stomach related to prior GI bleed.    Her aortic stenosis was  first evaluated around 2022 at [facility] while undergoing a renal transplant evaluation. At that time she had normal LV function with mild LVH, trivial AI/MR, and mild to moderate AS. She later presented to [facility] with NSTEMI and ultimately underwent two vessel CABG in 2022 with Dr. Laneta Simmers. She has had ongoing issues with COPD exacerbations and acute CHF c/b hypoxemia requiring hospitalizations. She contracted COVID in early 2023 and since that time has developed an oxygen requirement. She was evaluated by pulmonology and despite rather long smoking history, her PFTs showed no evidence of COPD but was concerning for interstitial  lung disease. High-resolution chest CT in 2023 was negative for ILD.    Echo 07/02/22 at [facility] showed progression of AS into the severe range with a mean gradient at 37 mmHg and she was referred to the Structural Heart Team for evaluation. [facility] 07/24/22 showed patent LIMA to LAD and occluded SVG to PDA with occluded LAD and mRCA with moderate disease of the mLCx. CT scans 07/24/22 showed dense calcium at the sub-annular left and right cusps with concern for severe post TAVR perivalvular leak or annular disruption with deployment. She was relatively asymptomatic at the time so continued surveillance was recommended. Repeat echo 12/09/22 showed EF 60-65%, severe concentric LVH, and critical AS with a mean grad 52 mmHg, AVA 0.80 cm2, and mod-severe TR. She was seen back in the office and reported lifestyle limiting shortness of breath. Case discussed with multidisciplinary team with plans to proceed with TAVR despite higher than normal risks. This was set up for 12/23/2022.    Hospital Course     Consultants: nephrology.   Critical AS: s/p successful TAVR with a 23 mm Edwards Sapien 3 THV via the TF approach on 12/23/22. Post operative echo showed EF 65%, severe LVH, moderate RV dysfunction, moderate to severe MAC with mild MR/mod MS, severe TR and s/p TAVR with a mean gradient of 40 mmHg  and mild PVL. LVOT VTI elevated at 38 cm (likely over estimated) with SV 119 cc. Valve reported to be under expanded. Some of the increased gradient could be flow related, but also there is concerns for prosthetic valve stenosis. This is likely due to under-expansion due to heavy annular calcification and patient prosthesis mismatch. There is no further intervention that we can offer her. Will follow on one month echo. Started on a baby aspirin 81 mg daily. Walked with cardiac rehab with no issues. Plan for discharge home with close follow up in the outpatient setting.    New LBBB: pt developed a new LBBB after TAVR. She has had no evidence of HAVB. Will resume her home Coreg 25mg  BID at discharge with live ambulatory telemetry to rule out late presenting HAVB.   CAD s/p CABGx2V: [facility] 07/24/22 showed patent LIMA to LAD and occluded SVG to PDA with occluded LAD and mRCA with moderate disease of the mLCx. Started on a baby aspirin 81mg  daily as above. She is intolerant to statins. Continue Coreg 25mg  BID.    Acute on chronic HFpEF: had evidence of volume overload with an LVEDP 34 mm hg at the time of TAVR and echo findings. Volume management per nephrology.    ESRD on HD (MWF): continue regular schedule. She underwent her normally scheduled HD in the hospital today.    Chronic respiratory failure on home 02: stable. Resume home 02.   HTN: BP well controlled. Resumed on home Coreg 25mg  BID, clonidine patch 0.3mg /24 hour, hydralazine 100mg  TID, imdur 60mg  daily, nifedipine xl 60mg  daily, and olmesartan 40mg  daily.   GI bleeding: hg is stable ~10. Followed by GI in the outpatient setting. She has not been on antiplatelets presumably due to previous GI bleeding. Will start a baby aspirin 81 mg daily and hopefully she tolerates this okay.    Chronic low dose steroid therapy: unclear reason for this, but she was continued on prednisone 5mg  daily _____________  Discharge Vitals Blood pressure (!) 142/51,  pulse 72, temperature 98.4 F (36.9 C), temperature source Oral, resp. rate 15, height 5\' 1"  (1.549 m), weight 58.2 kg, SpO2 99%.  Filed  Weights   12/24/22 0500 12/24/22 1647 12/25/22 0523  Weight: 55.8 kg 59 kg 58.2 kg    GEN: up eating breakfast HEENT: Grossly normal.  Neck: Supple, no JVD, carotid bruits, or masses. Cardiac: RRR,4/6 systolic murmur. No rubs, or gallops. No clubbing, cyanosis, edema.   Respiratory:  Respirations regular and unlabored, clear to auscultation bilaterally. GI: Soft, nontender, nondistended, BS + x 4. MS: no deformity or atrophy. Skin: warm and dry, no rash. Groin sites clear without hematoma or ecchymosis.  Neuro:  Strength and sensation are intact. Psych: AAOx3.  Normal affect.   Disposition   Pt is being discharged home today in good condition.  Follow-up Plans & Appointments     Follow-up Information     Filbert Schilder, NP. Go on 12/29/2022.   Specialty: Cardiology Why: @ 8:40am, please arrive at least 10 minutes early. Contact information: [address] [place] [address] [phone]                Discharge Instructions     Amb Referral to Cardiac Rehabilitation   Complete by: As directed    Diagnosis: Valve Replacement   Valve: Aortic Comment - TAVR   After initial evaluation and assessments completed: Virtual Based Care may be provided alone or in conjunction with Phase 2 Cardiac Rehab based on patient barriers.: Yes   Intensive Cardiac Rehabilitation (ICR) [facility] location only OR Traditional Cardiac Rehabilitation (TCR) *If criteria for ICR are not met will enroll in TCR [facility] only): Yes       Discharge Medications   Allergies as of 12/25/2022       Reactions   3-methyl-2-benzothiazolinone Hydrazone Other (See Comments)   Muscle weakness muscle cramping in legs Other reaction(s): Myalgias (Muscle Pain)   Banana Anaphylaxis, Swelling, Other (See Comments)   TONGUE AND MOUTH SWELLING   Black Walnut  Flavor Anaphylaxis, Itching   Walnuts   Hazelnut (filbert) Anaphylaxis   Hazelnuts   Leflunomide And Related Other (See Comments)   Severe Headache   Lisinopril Swelling   Angioedema  Swelling of the face   No Healthtouch Food Allergies Anaphylaxis   Spicy Mustard 4.7.2021 Pt reports that she eats Regular Yellow Mustard Swelling and itching of tongue   Other Anaphylaxis, Swelling, Other (See Comments)   Cosentyx= angioedema Mouth itches and tongue swelling Hazel nuts and pecans also Walnuts Renal failure Serotonin syndrome  Tremors Muscle weakness muscle cramping in legs Other reaction(s): Myalgias (Muscle Pain)   Pecan Nut (diagnostic) Anaphylaxis, Itching   Pecans   Trazodone And Nefazodone Other (See Comments)   Serotonin Syndrome  Tremors   Adalimumab Other (See Comments)   Blisters on abdomen =humira   Escitalopram Oxalate Other (See Comments)   Hand problems - Serotonin Syndrome    Ezetimibe Other (See Comments)   myalgias cramps   Secukinumab Swelling   Cosentyx = angiodema   Statins Other (See Comments)   Leg pains and weakness   Ferrlecit [na Ferric Gluc Cplx In Sucrose]    Not reaction given from HD   Gabapentin Other (See Comments)   tremors   Nsaids    Avoid due to renal problems         Medication List     TAKE these medications    acetaminophen 500 MG tablet Commonly known as: TYLENOL Take 1,000 mg by mouth daily as needed (pain).   albuterol (2.5 MG/3ML) 0.083% nebulizer solution Commonly known as: PROVENTIL Take 3 mLs (2.5 mg total) by  nebulization every 6 (six) hours as needed for wheezing or shortness of breath.   Ventolin HFA 108 (90 Base) MCG/ACT inhaler Generic drug: albuterol Inhale 2 puffs into the lungs See admin instructions. Take 2 puffs every 4-6 hours as needed for wheezing and shortness of breath   allopurinol 100 MG tablet Commonly known as: ZYLOPRIM Take 100 mg by mouth every evening.   aspirin 81 MG chewable  tablet Chew 1 tablet (81 mg total) by mouth daily.   carboxymethylcellulose 1 % ophthalmic solution Place 1 drop into both eyes 3 (three) times daily.   carvedilol 25 MG tablet Commonly known as: COREG Take 25 mg by mouth 2 (two) times daily with a meal.   cetirizine 10 MG tablet Commonly known as: ZYRTEC Take 10 mg by mouth daily as needed for allergies.   cinacalcet 30 MG tablet Commonly known as: SENSIPAR Take 60 mg by mouth every Monday, Wednesday, and Friday with hemodialysis.   cloNIDine 0.3 mg/24hr patch Commonly known as: CATAPRES - Dosed in mg/24 hr Place 0.3 mg onto the skin every Sunday.   diclofenac Sodium 1 % Gel Commonly known as: VOLTAREN Apply 2 g topically daily as needed (pain).   docusate sodium 100 MG capsule Commonly known as: COLACE Take 100 mg by mouth daily.   EPINEPHrine 0.3 mg/0.3 mL Soaj injection Commonly known as: EPI-PEN Inject into the muscle as needed for anaphylaxis.   fluticasone 50 MCG/ACT nasal spray Commonly known as: FLONASE Place 1 spray into both nostrils daily as needed for allergies or rhinitis.   hydrALAZINE 100 MG tablet Commonly known as: APRESOLINE Take 1 tablet (100 mg total) by mouth every 8 (eight) hours.   isosorbide mononitrate 60 MG 24 hr tablet Commonly known as: IMDUR Take 1 tablet (60 mg total) by mouth daily.   LORazepam 0.5 MG tablet Commonly known as: ATIVAN Take 0.5 mg by mouth every 8 (eight) hours as needed for anxiety. What changed: Another medication with the same name was removed. Continue taking this medication, and follow the directions you see here.   montelukast 10 MG tablet Commonly known as: SINGULAIR Take 10 mg by mouth daily with supper.   multivitamin Tabs tablet Take 1 tablet by mouth every Monday, Wednesday, and Friday.   neomycin-polymyxin b-dexamethasone 3.5-10000-0.1 Susp Commonly known as: MAXITROL Place 1 drop into both eyes in the morning, at noon, in the evening, and at  bedtime.   NIFEdipine 60 MG 24 hr tablet Commonly known as: PROCARDIA XL/NIFEDICAL XL Take 60 mg by mouth 2 (two) times daily.   olmesartan 40 MG tablet Commonly known as: BENICAR Take 40 mg by mouth every evening.   ondansetron 4 MG tablet Commonly known as: ZOFRAN Take 4 mg by mouth 2 (two) times daily as needed for nausea or vomiting.   OXYGEN Inhale 2-3 L into the lungs continuous.   pantoprazole 40 MG tablet Commonly known as: PROTONIX Take 40 mg by mouth 2 (two) times daily.   predniSONE 5 MG tablet Commonly known as: DELTASONE Take 1 tablet (5 mg total) by mouth daily with breakfast.   PROBIOTIC PO Take 1 capsule by mouth daily.   Proctozone-HC 2.5 % rectal cream Generic drug: hydrocortisone Place rectally 2 (two) times daily.   silver sulfADIAZINE 1 % cream Commonly known as: SILVADENE Apply 1 Application topically daily as needed (wound care).   Soothe Nighttime Oint Place 1 Application into both eyes at bedtime.   Stiolto Respimat 2.5-2.5 MCG/ACT Aers Generic drug: Tiotropium Bromide-Olodaterol  Inhale 2 puffs once daily into the lungs.   TEARS NATURALE OP Place 1 drop into both eyes as needed (dry eye).   Vitamin D 50 MCG (2000 UT) tablet Take 2,000 Units by mouth daily.   zolpidem 5 MG tablet Commonly known as: AMBIEN Take 5 mg by mouth at bedtime as needed for sleep.            Outstanding Labs/Studies   none  Duration of Discharge Encounter   Greater than 30 minutes including physician time.  Signed, Cline Crock, PA-C 12/25/2022, 8:36 AM [phone]   ATTENDING ATTESTATION:  After conducting a review of all available clinical information with the care team, interviewing the patient, and performing a physical exam, I agree with the findings and plan described in this note.   GEN: No acute distress.   HEENT:  MMM, no JVD, no scleral icterus Cardiac: RRR, with 3/6 SEM  Respiratory: Clear to auscultation bilaterally. GI:  Soft, nontender, non-distended  MS: No edema; No deformity. Neuro:  Nonfocal  Vasc:  +2 radial pulses; access sites intact  Patient is doing well after uncomplicated TAVR on Tuesday.  The patient had a 23 mm SAPIEN 3 valve with intentional under expansion due to a heavily calcified annulus.  Her echocardiogram yesterday demonstrated a high mean gradient that I suspect is from patient prosthesis mismatch versus high flow state.  The patient underwent dialysis yesterday and had 2 L removed.  She feels better.  She was able to ambulate without issues.  Will plan on discharge today with close hospital follow-up.  Will obtain echocardiogram in a month to evaluate gradient.  Again our options are limited in this particular individual.  Alverda Skeans, MD Pager [phone]

## 2022-12-24 NOTE — Progress Notes (Signed)
[facility] Progress Note  Subjective: seen in room, alert, no sob or cough  Vitals:   12/24/22 0500 12/24/22 0735 12/24/22 1200 12/24/22 1527  BP: (!) 159/60 (!) 123/52 (!) 147/61 (!) 151/63  Pulse: 73 74 74 69  Resp: 18 20 19 16   Temp:  99 F (37.2 C) 99 F (37.2 C) 98.2 F (36.8 C)  TempSrc:  Oral Oral Oral  SpO2: 99% 99% 99%   Weight: 55.8 kg     Height:        Exam: Gen alert, no distress, adult female, [place] 3L O2 No jvd or bruits Chest clear bilat to bases, no rales/ wheezing RRR no RG Abd soft ntnd no mass or ascites +bs Ext no LE or UE edema Neuro is Ox3, alert, nf    LUA AVF+bruit           Renal-related home meds: - coreg 25 bid - sensipar 60mg  mwf - clonidine patch 0.3 q Sunday - hydralazine 100 tid - imdur 60 every day - procardia xl 60 bid - home O2 2-3 L  - olmesartan 300 qd      OP HD:  [facility] MWF [address]  3.5h  58.5kg  400/600  2K/3Ca bath  Hep none  L AVF     Assessment/ Plan: SP TAVR - on 11/12, for severe aortic stenosis.  ESKD - on HD MWF. HD pending today.  HTN - bp's stable today, 130/80 range Volume - no gross vol overload on exam. Lower vol as tol w/ HD.  Anemia of eskd - Hb 10- 13 here. Follow.  MBD ckd - CCa in range. Cont binders w/ meals.    Vinson Moselle MD  [facility] 12/24/2022, 3:45 PM  Recent Labs  Lab 12/19/22 0830 12/23/22 1158 12/23/22 1636 12/24/22 0349  HGB 10.8*   < > 9.9* 9.9*  ALBUMIN 3.4*  --   --   --   CALCIUM 8.7*  --   --  8.3*  CREATININE 6.22*   < > 3.60* 5.38*  K 3.6   < > 3.5 3.6   < > = values in this interval not displayed.   No results for input(s): "IRON", "TIBC", "FERRITIN" in the last 168 hours. Inpatient medications:  allopurinol  100 mg Oral QPM   aspirin  81 mg Oral Daily   Chlorhexidine Gluconate Cloth  6 each Topical Q0600   cinacalcet  60 mg Oral Q M,W,F-HD   docusate sodium  100 mg Oral Daily   irbesartan  300 mg Oral Daily   isosorbide mononitrate  60 mg  Oral Daily   loratadine  10 mg Oral Daily   montelukast  10 mg Oral Q supper   NIFEdipine  60 mg Oral BID   pantoprazole  40 mg Oral BID   predniSONE  5 mg Oral Q breakfast   sodium chloride flush  3 mL Intravenous Q12H    sodium chloride     anticoagulant sodium citrate     nitroGLYCERIN Stopped (12/23/22 1804)   sodium chloride, acetaminophen **OR** acetaminophen, albuterol, alteplase, anticoagulant sodium citrate, heparin, lidocaine (PF), lidocaine-prilocaine, LORazepam, morphine injection, ondansetron (ZOFRAN) IV, oxyCODONE, pentafluoroprop-tetrafluoroeth, sodium chloride flush, traMADol, zolpidem

## 2022-12-25 ENCOUNTER — Other Ambulatory Visit (HOSPITAL_COMMUNITY): Payer: Medicare PPO

## 2022-12-25 ENCOUNTER — Other Ambulatory Visit: Payer: Self-pay | Admitting: Physician Assistant

## 2022-12-25 LAB — BASIC METABOLIC PANEL
Anion gap: 9 (ref 5–15)
BUN: 22 mg/dL (ref 8–23)
CO2: 27 mmol/L (ref 22–32)
Calcium: 8 mg/dL — ABNORMAL LOW (ref 8.9–10.3)
Chloride: 95 mmol/L — ABNORMAL LOW (ref 98–111)
Creatinine, Ser: 3.75 mg/dL — ABNORMAL HIGH (ref 0.44–1.00)
GFR, Estimated: 13 mL/min — ABNORMAL LOW (ref >60)
Glucose, Bld: 98 mg/dL (ref 70–99)
Potassium: 3.7 mmol/L (ref 3.5–5.1)
Sodium: 131 mmol/L — ABNORMAL LOW (ref 135–145)

## 2022-12-25 MED ORDER — CARVEDILOL 25 MG PO TABS: 25.0000 mg | ORAL_TABLET | Freq: Two times a day (BID) | ORAL | 1 refills | Status: DC

## 2022-12-25 MED ORDER — SODIUM CHLORIDE 0.9% FLUSH: 3.0000 mL | Freq: Two times a day (BID) | INTRAVENOUS | Status: DC

## 2022-12-25 NOTE — Progress Notes (Signed)
[facility] Progress Note  Subjective: seen in room, might be going home  Vitals:   12/25/22 0011 12/25/22 0507 12/25/22 0523 12/25/22 0828  BP: (!) 148/65 (!) 137/58  (!) 142/51  Pulse: 73 70 73 72  Resp: 15 20 17 15   Temp: 97.8 F (36.6 C) 98.7 F (37.1 C)  98.4 F (36.9 C)  TempSrc: Axillary Oral  Oral  SpO2: 94% 99% 99% 99%  Weight:   58.2 kg   Height:        Exam: Gen alert, no distress, adult female, [place] 3L O2 No jvd or bruits Chest clear bilat to bases, no rales/ wheezing RRR no RG Abd soft ntnd no mass or ascites +bs Ext no LE or UE edema Neuro is Ox3, alert, nf    LUA AVF+bruit           Renal-related home meds: - coreg 25 bid - sensipar 60mg  mwf - clonidine patch 0.3 q Sunday - hydralazine 100 tid - imdur 60 every day - procardia xl 60 bid - home O2 2-3 L  - olmesartan 300 qd      OP HD:  [facility] MWF [address]  3.5h  58.5kg  400/600  2K/3Ca bath  Hep none  L AVF     Assessment/ Plan: SP TAVR - on 11/12, for severe aortic stenosis.  ESKD - on HD MWF. Had HD here yesterday. For dc today. HTN - bp's stable today, 130/80 range Volume - no gross vol overload on exam. On 3L [place] here as at home.  Anemia of eskd - Hb 10- 13 here. Follow.  MBD ckd - CCa in range. Cont binders w/ meals Chronic resp failure - on 3 L [place] O2 at home Dispo - for dc today    Vinson Moselle MD  [facility] 12/25/2022, 4:26 PM  Recent Labs  Lab 12/19/22 0830 12/23/22 1158 12/23/22 1636 12/24/22 0349 12/25/22 0429  HGB 10.8*   < > 9.9* 9.9*  --   ALBUMIN 3.4*  --   --   --   --   CALCIUM 8.7*  --   --  8.3* 8.0*  CREATININE 6.22*   < > 3.60* 5.38* 3.75*  K 3.6   < > 3.5 3.6 3.7   < > = values in this interval not displayed.   No results for input(s): "IRON", "TIBC", "FERRITIN" in the last 168 hours. Inpatient medications:  allopurinol  100 mg Oral QPM   aspirin  81 mg Oral Daily   Chlorhexidine Gluconate Cloth  6 each Topical Q0600   cinacalcet  60  mg Oral Q M,W,F-HD   docusate sodium  100 mg Oral Daily   irbesartan  300 mg Oral Daily   isosorbide mononitrate  60 mg Oral Daily   loratadine  10 mg Oral Daily   montelukast  10 mg Oral Q supper   NIFEdipine  60 mg Oral BID   pantoprazole  40 mg Oral BID   predniSONE  5 mg Oral Q breakfast   sodium chloride flush  3 mL Intravenous Q12H   sodium chloride flush  3 mL Intravenous Q12H    nitroGLYCERIN Stopped (12/23/22 1804)   acetaminophen **OR** acetaminophen, albuterol, LORazepam, morphine injection, ondansetron (ZOFRAN) IV, oxyCODONE, sodium chloride flush, traMADol, zolpidem

## 2022-12-25 NOTE — Progress Notes (Signed)
CARDIAC REHAB PHASE I    Pt for discharge home today. Post TAVR education completed. Referral for CRP2 sent to [facility].   Woodroe Chen, RN BSN 12/25/2022 9:21 AM

## 2022-12-25 NOTE — Progress Notes (Signed)
Mobility Specialist Progress Note:   12/25/22 0946  Mobility  Activity Ambulated with assistance in hallway  Level of Assistance Standby assist, set-up cues, supervision of patient - no hands on  Assistive Device Front wheel walker  Distance Ambulated (ft) 235 ft  Activity Response Tolerated well  Mobility Referral Yes  $Mobility charge 1 Mobility  Mobility Specialist Start Time (ACUTE ONLY) 0930  Mobility Specialist Stop Time (ACUTE ONLY) 0943  Mobility Specialist Time Calculation (min) (ACUTE ONLY) 13 min   Pt received in bed, agreeable to mobility. C/o soreness from groin bilateral, otherwise asx throughout. Ambulated on 4 L. VSS. Pt returned to bed with call bell in reach and all needs met.   Leory Plowman  Mobility Specialist Please contact via [employer] office at [phone]

## 2022-12-25 NOTE — Progress Notes (Signed)
D/C order noted. Contacted [facility] to advise clinic of pt's d/c today and that pt should resume care tomorrow. Clinic has access to [facility] epic to obtain needed clinicals.   Olivia Canter Renal Navigator [phone]

## 2022-12-25 NOTE — Progress Notes (Signed)
Pt s/p TAVR, stable for transition home today, No TOC needs noted. Family to transport home.     12/25/22 1024  TOC Brief Assessment  Insurance and Status Reviewed  Patient has primary care physician Yes  Home environment has been reviewed home  Prior level of function: self, has home 02  Prior/Current Home Services No current home services  Social Determinants of Health Reivew SDOH reviewed no interventions necessary  Readmission risk has been reviewed Yes  Transition of care needs no transition of care needs at this time

## 2022-12-26 ENCOUNTER — Telehealth: Payer: Self-pay | Admitting: Physician Assistant

## 2022-12-26 MED FILL — Nitroglycerin IV Soln 100 MCG/ML in D5W: INTRA_ARTERIAL | Qty: 10 | Status: AC

## 2022-12-26 NOTE — Telephone Encounter (Signed)
  [facility]   MULTIDISCIPLINARY HEART VALVE TEAM  Attempted TOC call. Left VM.   Promiss Labarbera PA-C  [facility]     

## 2022-12-26 NOTE — Telephone Encounter (Signed)
Patient contacted regarding discharge from [facility] on 12/25/2022.  Patient understands to follow up with provider Georgie Chard NP on 12/29/2022 at 8:40 AM at [facility] location . Patient understands discharge instructions? yes Patient understands medications and regiment? yes Patient understands to bring all medications to this visit? Yes  Per pt she is doing okay since discharge.  The pt feels like she is retaining fluid and is scheduled for dialysis session today.

## 2022-12-29 ENCOUNTER — Ambulatory Visit: Payer: Medicare PPO | Attending: Cardiology | Admitting: Cardiology

## 2022-12-29 VITALS — BP 130/60 | HR 68 | Ht 61.0 in | Wt 131.8 lb

## 2022-12-29 DIAGNOSIS — I447 Left bundle-branch block, unspecified: Secondary | ICD-10-CM | POA: Diagnosis not present

## 2022-12-29 DIAGNOSIS — E118 Type 2 diabetes mellitus with unspecified complications: Secondary | ICD-10-CM

## 2022-12-29 DIAGNOSIS — Z951 Presence of aortocoronary bypass graft: Secondary | ICD-10-CM

## 2022-12-29 DIAGNOSIS — I350 Nonrheumatic aortic (valve) stenosis: Secondary | ICD-10-CM

## 2022-12-29 DIAGNOSIS — N186 End stage renal disease: Secondary | ICD-10-CM

## 2022-12-29 DIAGNOSIS — Z952 Presence of prosthetic heart valve: Secondary | ICD-10-CM | POA: Diagnosis not present

## 2022-12-29 DIAGNOSIS — K921 Melena: Secondary | ICD-10-CM

## 2022-12-29 MED ORDER — AMOXICILLIN 500 MG PO TABS: ORAL_TABLET | ORAL | 6 refills | Status: DC

## 2022-12-29 NOTE — Patient Instructions (Signed)
Medication Instructions:  Start Amoxicillin 500 mg, take 4 tablets by mouth 1 hour prior to dental procedures and cleanings. '  *If you need a refill on your cardiac medications before your next appointment, please call your pharmacy*   Lab Work: None ordered   If you have labs (blood work) drawn today and your tests are completely normal, you will receive your results only by: MyChart Message (if you have MyChart) OR A paper copy in the mail If you have any lab test that is abnormal or we need to change your treatment, we will call you to review the results.   Testing/Procedures: None ordered    Follow-Up: Follow up as scheduled   Other Instructions

## 2022-12-29 NOTE — Progress Notes (Signed)
[facility]   MULTIDISCIPLINARY HEART VALVE TEAM  Structural Heart Office Note:  .    Date:  12/29/2022  ID:  Jodi Bell, DOB May 05, 1960, MRN [id number] PCP: Jodi So., MD  [facility] Providers Cardiologist:  Jodi Schultz, MD/ Dr. Lynnette Bell & Dr. Laneta Bell (TAVR)   History of Present Illness: .    Jodi Bell is a 62 y.o. female with a history of CAD s/p CABGx2V (LIMA-->LAD, SVG-->PDA in 2022 by Dr. Laneta Bell), ESRD on HD (MWF), former tobacco abuse, chronic respiratory failure on home 02, diet controlled DMT2, HTN, GI bleeding, and critical AS who is now s/p TAVR 12/23/2022 and is here today for TOC follow up.    Ms. Jodi Bell aortic stenosis was first evaluated around 2022 at [facility] while undergoing a renal transplant evaluation. At that time she had normal LV function with mild LVH, trivial AI/MR, and mild to moderate AS. She later presented to [facility] with NSTEMI and ultimately underwent two vessel CABG in 2022 with Dr. Laneta Bell. She has had ongoing issues with COPD exacerbations and acute CHF c/b hypoxemia requiring hospitalizations. She contracted COVID in early 2023 and since that time has developed an oxygen requirement. She was evaluated by pulmonology and despite rather long smoking history, her PFTs showed no evidence of COPD but was concerning for interstitial lung disease. High-resolution chest CT in 2023 was negative for ILD.    Echo 07/02/22 at [facility] showed progression of AS into the severe range with a mean gradient at 37 mmHg and she was referred to the Structural Heart Team for evaluation. [facility] 07/24/22 showed patent LIMA to LAD and occluded SVG to PDA with occluded LAD and mRCA with moderate disease of the mLCx. CT scans 07/24/22 showed dense calcium at the sub-annular left and right cusps with concern for severe post TAVR perivalvular leak or annular disruption with deployment. She was relatively asymptomatic at the time so continued surveillance  was recommended. Repeat echo 12/09/22 showed EF 60-65%, severe concentric LVH, and critical AS with a mean grad 52 mmHg, AVA 0.80 cm2, and mod-severe TR. She was seen back in the office and reported lifestyle limiting shortness of breath. Case discussed with multidisciplinary team with plans to proceed with TAVR despite higher than normal risks. This was set up for 12/23/2022.    She did well post procedure with no issues. She was kept an additional night due to timing of HD. Otherwise today she denies chest pain, SOB, palpitations, LE edema, orthopnea, PND, dizziness, or syncope. Denies bleeding in stool or urine. She does report she used her inhaler yesterday for the first time in several months due to wheezing but feels better today. Groin sites are stable with no issues.   Physical Exam:   VS:  BP 130/60   Pulse 68   Ht 5\' 1"  (1.549 m)   Wt 131 lb 12.8 oz (59.8 kg)   SpO2 98%   BMI 24.90 kg/m    Wt Readings from Last 3 Encounters:  12/29/22 131 lb 12.8 oz (59.8 kg)  12/25/22 128 lb 4.8 oz (58.2 kg)  12/12/22 128 lb 6.4 oz (58.2 kg)    General: Well developed, well nourished, NAD Lungs:Clear to ausculation bilaterally. No wheezes, rales, or rhonchi. Breathing is unlabored. Cardiovascular: RRR with S1 S2. + systolic murmur Extremities: No edema. Groin sites with no hematoma.  Neuro: Alert and oriented. No focal deficits. No facial asymmetry. MAE spontaneously. Psych: Responds to questions appropriately with normal  affect.    ASSESSMENT AND PLAN: .    Critical AS: s/p successful TAVR with a 23 mm Edwards Sapien 3 THV via the TF approach on 12/23/22. Post operative echo showed EF 65%, severe LVH, moderate RV dysfunction, moderate to severe MAC with mild MR/mod MS, severe TR and s/p TAVR with a mean gradient of 40 mmHg and mild PVL. LVOT VTI elevated at 38 cm (likely over estimated) with SV 119 cc. Valve reported to be under expanded. Some of the increased gradient could be flow related, but  also there is concerns for prosthetic valve stenosis. This is likely due to under-expansion due to heavy annular calcification and patient prosthesis mismatch with no further intervention. Continue ASA monotherapy. Dental SBE discussed today and will RX Amoxicillin to preferred pharm. Plan one month follow up with echocardiogram.    New LBBB: Noted on post TAVR EKG. Persistent on EKG today. Patient remains asymptomatic. ZIO in place.    CAD s/p CABGx2V: [facility] 07/24/22 showed patent LIMA to LAD and occluded SVG to PDA with occluded LAD and mRCA with moderate disease of the mLCx. Denies anginal symptoms. Continue ASA, Coreg 25mg  BID. Intolerant to statin therapy.    Acute on chronic HFpEF: NYHA class II symptoms. Reports mild wheezing yesterday prompting the use of her inhaler. Lungs CTA today. Discussed monitoring symptoms after HD today and if still requiring inhalers, plan CXR. Likely mild volume overload that will resolve after HD.   ESRD on HD (MWF): HD MWF schedule with recent shift to afternoons. See plan above.    Chronic respiratory failure on home 02:  Stable today with no change.    HTN: BP well controlled today with no changes. Continue  Coreg 25mg  BID, clonidine patch 0.3mg /24 hour, hydralazine 100mg  TID, imdur 60mg  daily, nifedipine xl 60mg  daily, and olmesartan 40mg  daily.   GI bleeding with chronic anemia: Baseline Hb appears to be in the 9.0 range. Follow closely given start of ASA and hx of GI bleeding.       Cardiac Rehabilitation Eligibility Assessment      I spent 20 minutes caring for this patient today including face-to-face discussions, ordering and reviewing labs, reviewing records from [facility] and other outside facilities, documenting in the record, and arranging for follow up.    Signed, Georgie Chard, NP

## 2022-12-30 ENCOUNTER — Telehealth (HOSPITAL_COMMUNITY): Payer: Self-pay

## 2022-12-30 NOTE — Telephone Encounter (Signed)
Attempted to call patient in regards to Cardiac Rehab - LM on VM 

## 2022-12-30 NOTE — Telephone Encounter (Signed)
Pt insurance is active and benefits verified through [facility]. Co-pay $25.00, DED $500.00/$164.66 met, out of pocket $8,850.00/$8,850.00 met, co-insurance 0%. No pre-authorization required. [employer]port, 12/30/22 @ 1:32PM, REF#[id number]   How many CR sessions are covered? (36 visits for TCR, 72 visits for ICR)72 Is this a lifetime maximum or an annual maximum? Annual Has the member used any of these services to date? No Is there a time limit (weeks/months) on start of program and/or program completion? No     Will contact patient to see if she is interested in the Cardiac Rehab Program. If interested, patient will need to complete follow up appt. Once completed, patient will be contacted for scheduling upon review by the RN Navigator.

## 2022-12-31 ENCOUNTER — Ambulatory Visit: Payer: Medicare PPO

## 2022-12-31 IMAGING — CT CTA GI BLEED
3 of 10 series · 10 of 46 positions shown, 16 images · IV contrast (omnipaque)
Comparison: None.

CLINICAL DATA: Gastrointestinal hemorrhage

EXAM:
CTA ABDOMEN AND PELVIS WITHOUT AND WITH CONTRAST
TECHNIQUE: Multidetector CT imaging of the abdomen and pelvis was performed
using the standard protocol during bolus administration of
intravenous contrast. Multiplanar reconstructed images and MIPs were
obtained and reviewed to evaluate the vascular anatomy.
CONTRAST:  100mL OMNIPAQUE IOHEXOL 350 MG/ML SOLN

[Series 9: arterial 3.0 · axial · arterial · 0.75mm/px · z∈[-630,-552]mm · 2 of 153 slices shown]
[im 13/153  soft-tissue]
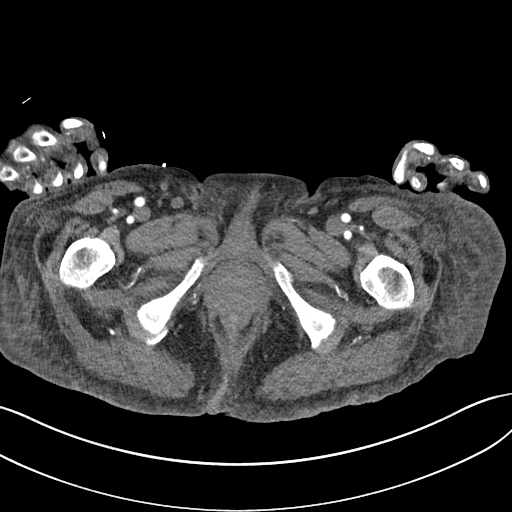
[im 39/153  soft-tissue]
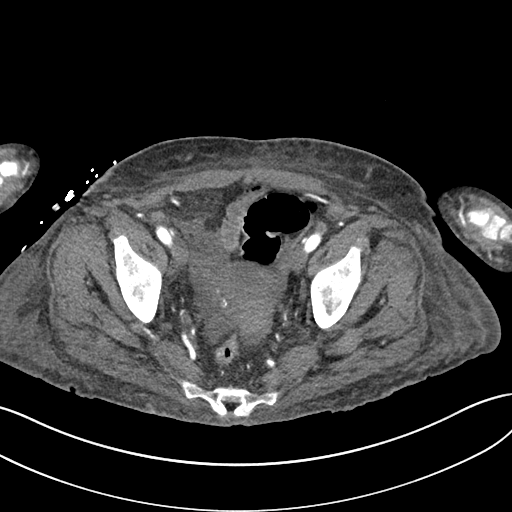

[Series 11: arterial cor · coronal · arterial · 0.97mm/px · 2 of 142 slices shown, 3 images]
[im 48/142  soft-tissue]
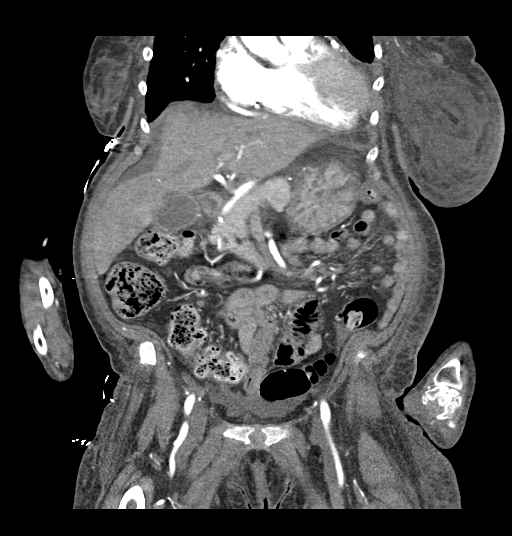
[im 48/142  bone]
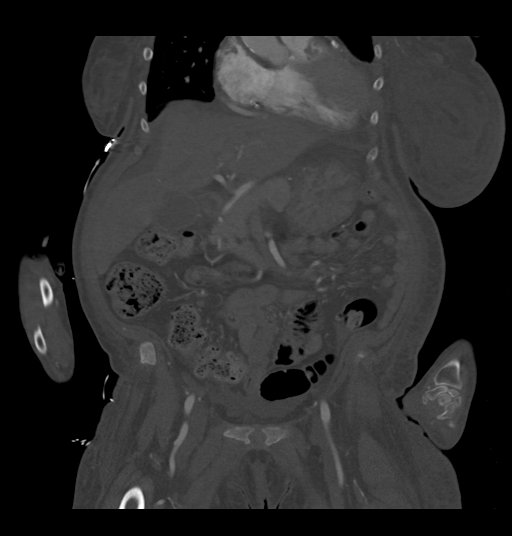
[im 95/142  soft-tissue]
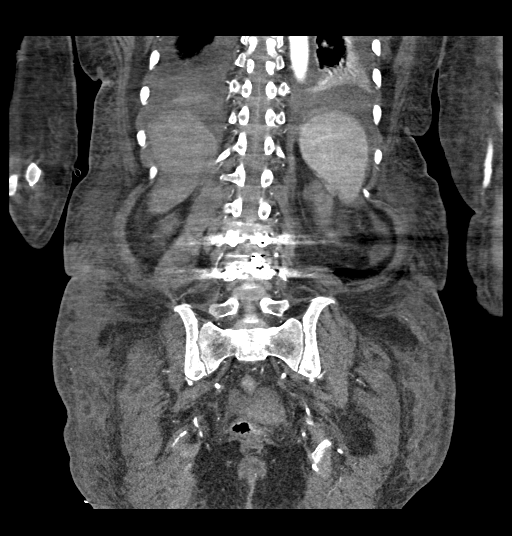

[Series 15: portal venous · axial · portal-venous · 0.75mm/px · z∈[-600,-276]mm · 6 of 92 slices shown, 11 images]
[im 14/92  soft-tissue]
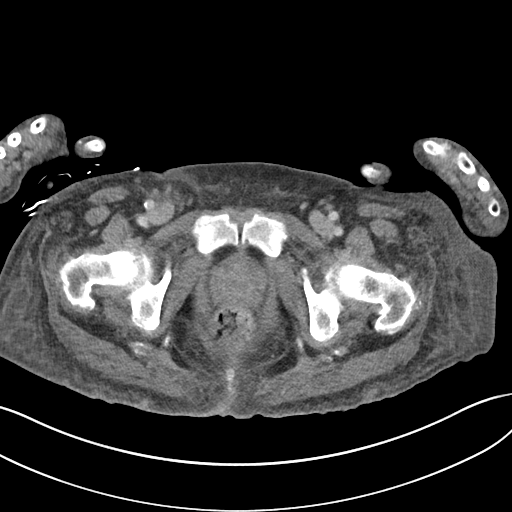
[im 14/92  bone]
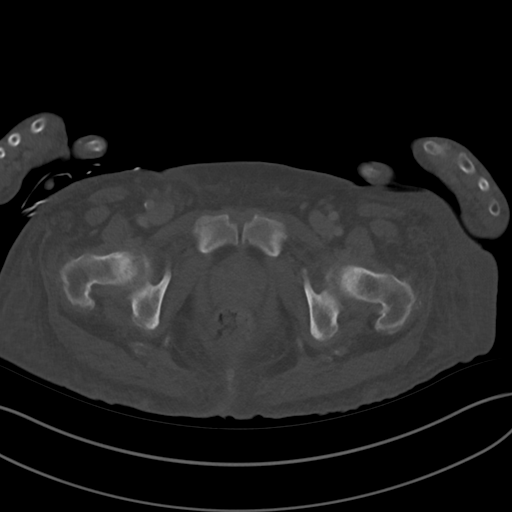
[im 27/92  soft-tissue]
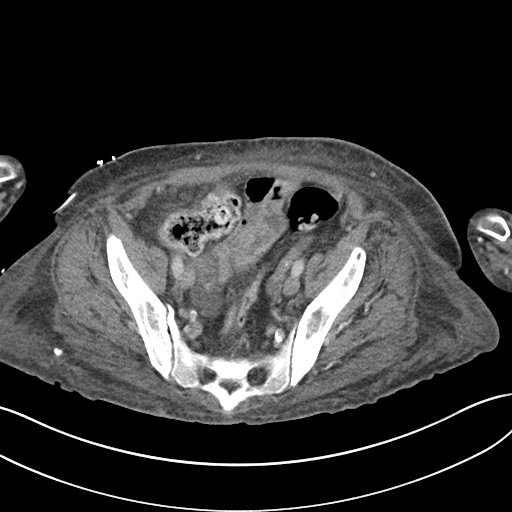
[im 40/92  soft-tissue]
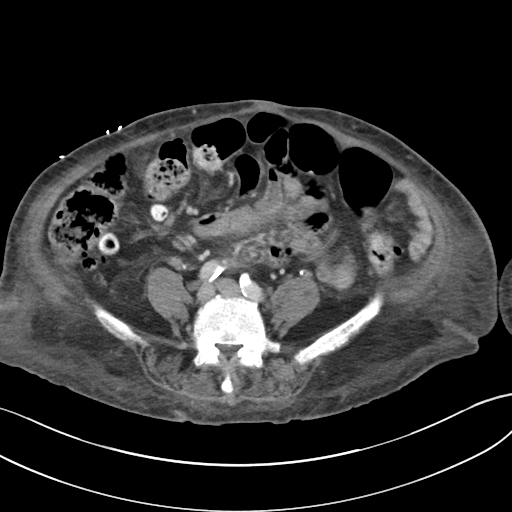
[im 40/92  lung]
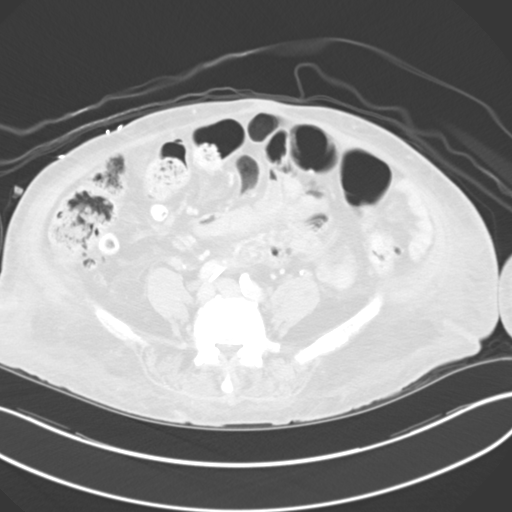
[im 53/92  soft-tissue]
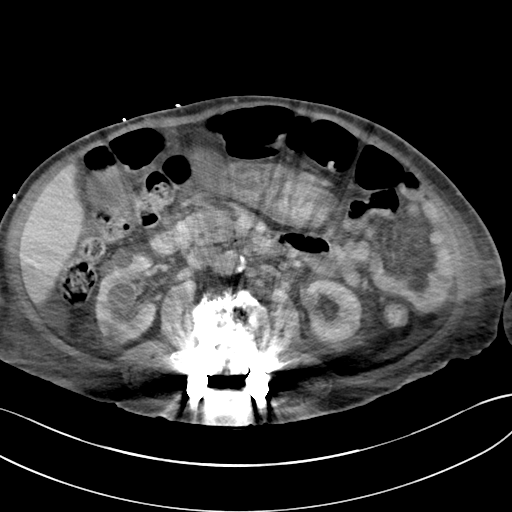
[im 53/92  lung]
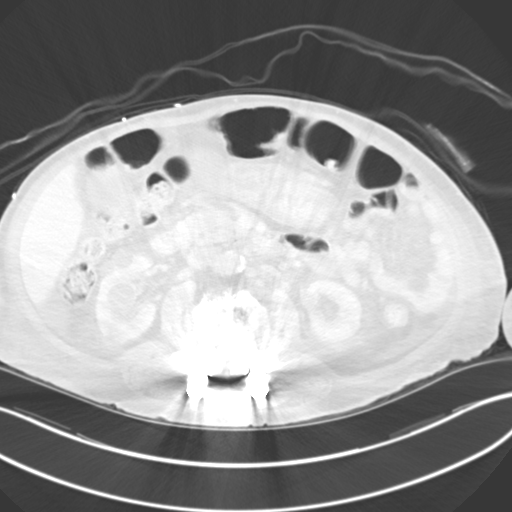
[im 66/92  soft-tissue]
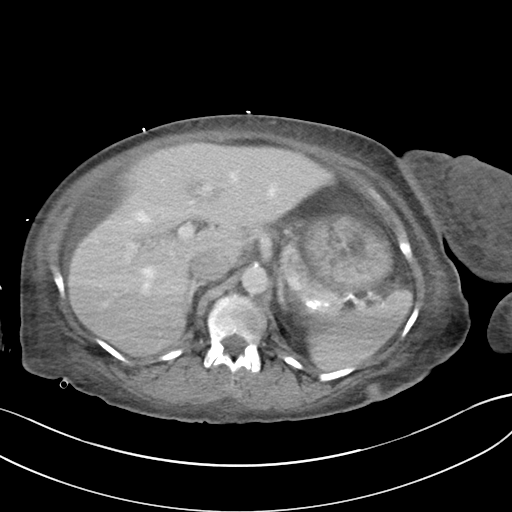
[im 66/92  lung]
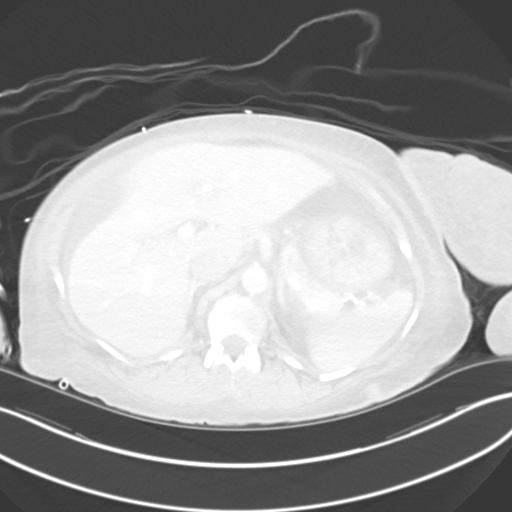
[im 79/92  soft-tissue]
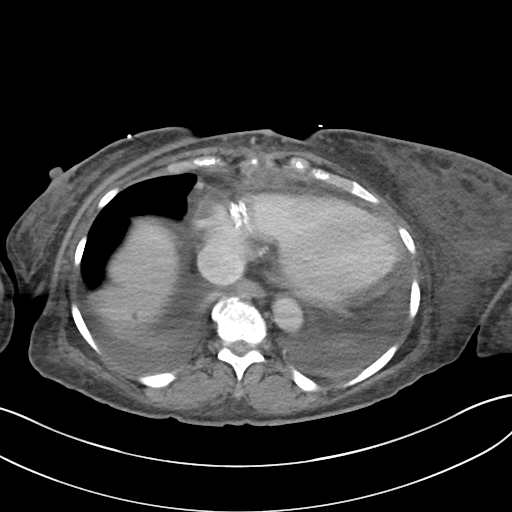
[im 79/92  lung]
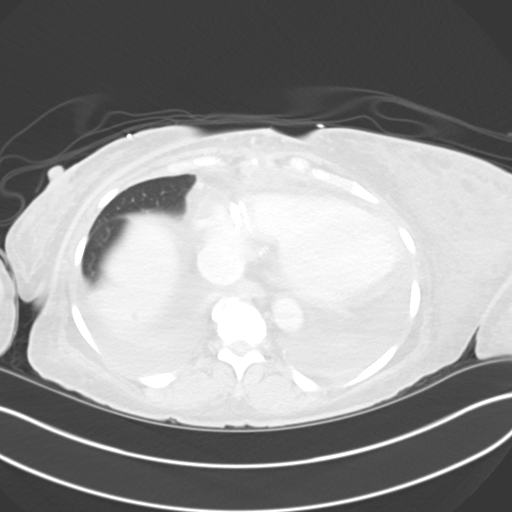

[10 of 46 positions shown; findings below may reference images not displayed]

FINDINGS: VASCULAR

Aorta: Normal caliber. No aneurysm or dissection. Mild
atherosclerotic calcification. No periaortic inflammatory change.

Celiac: Unremarkable

SMA: Unremarkable

Renals: Single renal arteries bilaterally. No evidence of
hemodynamically significant stenosis. Normal vascular morphology. No
aneurysm.

IMA: Greater than 50% stenosis secondary to atherosclerotic
calcification at the origin.

Inflow: Widely patent. No aneurysm or dissection. Moderate
atherosclerotic calcification. Internal iliac arteries are patent
bilaterally.

Proximal Outflow: Unremarkable

Veins: Unremarkable

Review of the MIP images confirms the above findings.

NON-VASCULAR

Lower chest: Small bilateral pleural effusions are present with
compressive atelectasis of the lower lobes bilaterally. Coronary
artery bypass grafting has been performed. Cardiac size is mildly
enlarged. No pericardial effusion.

Hepatobiliary: The gallbladder is mildly distended and demonstrates
mild gallbladder wall thickening and pericholecystic fluid. This is
nonspecific in the setting of anasarca, however, acute cholecystitis
is not excluded. No disruption of the gallbladder wall. No intra or
extrahepatic biliary ductal dilation. Liver unremarkable.

Pancreas: Unremarkable

Spleen: Unremarkable

Adrenals/Urinary Tract: The adrenal glands are unremarkable. The
kidneys are normal in size and under mildly atrophic bilaterally.
Multiple simple cortical cysts are seen within the kidneys
bilaterally. No hydronephrosis. No intrarenal or ureteral calculi.
The bladder is decompressed and is unremarkable.

Stomach/Bowel: Multiple endoscopic clips are seen within the gastric
fundus. There is prominent submucosal edema within the gastric
antrum possibly reflecting changes of antritis or peptic ulcer
disease. No evidence of obstruction or perforation. Scattered
diverticular seen throughout the colon. The stomach, small bowel,
and large bowel are otherwise unremarkable. Appendix normal. No
evidence of obstruction. No free intraperitoneal gas. Mild ascites.

Lymphatic: No pathologic adenopathy within the abdomen and pelvis.

Reproductive: Uterus and bilateral adnexa are unremarkable.

Other: No abdominal wall hernia identified. There is diffuse
subcutaneous edema identified within the abdominal wall in keeping
with changes of anasarca. Rectum unremarkable.

Musculoskeletal: No acute bone abnormality. Lumbar fusion with
instrumentation has been performed at L3-4. Erosive changes
involving the inferior endplate of L2 and superior endplate of L3
with associated mild focal kyphosis at this level is again
identified and appears stable and is compatible with advanced
degenerative disc disease. This is better assessed on prior MRI
examination of 09/11/2020.
IMPRESSION: VASCULAR

No evidence of active gastrointestinal hemorrhage. No aortic
aneurysm or dissection.

Greater than 50% stenosis of the inferior mesenteric artery at its
origin. Wide patency of the celiac axis and superior mesenteric
artery, however, are noted.

NON-VASCULAR

Mild cardiomegaly. Bilateral pleural effusions, mild ascites, and
diffuse body wall subcutaneous edema in keeping with anasarca,
possibly cardiogenic in nature.

Gallbladder distension, gallbladder wall thickening, and trace
pericholecystic fluid. This is nonspecific in the setting of
cardiogenic failure, however, acute cholecystitis is not excluded.
Correlation with liver enzymes would be helpful in excluding an
inflammatory process. This appearance, however, is stable from prior
CT examination of the chest of 09/23/2020 and similar gallbladder
wall thickening was noted on CT examination the abdomen pelvis on
09/09/2020 favoring a chronic process.

Multiple endoscopic clips noted within the gastric fundus. No active
extravasation identified.

Prominent submucosal edema involving the gastric antrum possibly
related to local inflammation as can be seen with peptic ulcer
disease or antritis. No evidence of obstruction or perforation.

Colonic diverticulosis without superimposed acute inflammatory
change.

Aortic Atherosclerosis (GVQT1-V8Y.Y).

## 2023-01-02 ENCOUNTER — Emergency Department (HOSPITAL_COMMUNITY): Payer: Medicare PPO

## 2023-01-02 ENCOUNTER — Other Ambulatory Visit: Payer: Self-pay

## 2023-01-02 ENCOUNTER — Inpatient Hospital Stay (HOSPITAL_COMMUNITY)
Admission: EM | Admit: 2023-01-02 | Discharge: 2023-01-07 | DRG: 377 | Disposition: A | Discharge status: Home Health | Payer: Medicare PPO | Attending: Internal Medicine | Admitting: Internal Medicine

## 2023-01-02 DIAGNOSIS — D696 Thrombocytopenia, unspecified: Secondary | ICD-10-CM | POA: Diagnosis present

## 2023-01-02 DIAGNOSIS — I447 Left bundle-branch block, unspecified: Secondary | ICD-10-CM | POA: Diagnosis present

## 2023-01-02 DIAGNOSIS — G4733 Obstructive sleep apnea (adult) (pediatric): Secondary | ICD-10-CM | POA: Diagnosis present

## 2023-01-02 DIAGNOSIS — I059 Rheumatic mitral valve disease, unspecified: Secondary | ICD-10-CM | POA: Diagnosis present

## 2023-01-02 DIAGNOSIS — Z952 Presence of prosthetic heart valve: Secondary | ICD-10-CM | POA: Diagnosis not present

## 2023-01-02 DIAGNOSIS — R9431 Abnormal electrocardiogram [ECG] [EKG]: Secondary | ICD-10-CM | POA: Diagnosis not present

## 2023-01-02 DIAGNOSIS — I440 Atrioventricular block, first degree: Secondary | ICD-10-CM | POA: Diagnosis present

## 2023-01-02 DIAGNOSIS — I953 Hypotension of hemodialysis: Secondary | ICD-10-CM | POA: Diagnosis present

## 2023-01-02 DIAGNOSIS — K254 Chronic or unspecified gastric ulcer with hemorrhage: Principal | ICD-10-CM | POA: Diagnosis present

## 2023-01-02 DIAGNOSIS — E1151 Type 2 diabetes mellitus with diabetic peripheral angiopathy without gangrene: Secondary | ICD-10-CM | POA: Diagnosis present

## 2023-01-02 DIAGNOSIS — Z888 Allergy status to other drugs, medicaments and biological substances status: Secondary | ICD-10-CM

## 2023-01-02 DIAGNOSIS — D62 Acute posthemorrhagic anemia: Secondary | ICD-10-CM | POA: Diagnosis present

## 2023-01-02 DIAGNOSIS — I25709 Atherosclerosis of coronary artery bypass graft(s), unspecified, with unspecified angina pectoris: Secondary | ICD-10-CM | POA: Diagnosis not present

## 2023-01-02 DIAGNOSIS — E1122 Type 2 diabetes mellitus with diabetic chronic kidney disease: Secondary | ICD-10-CM | POA: Diagnosis present

## 2023-01-02 DIAGNOSIS — K760 Fatty (change of) liver, not elsewhere classified: Secondary | ICD-10-CM | POA: Diagnosis present

## 2023-01-02 DIAGNOSIS — I5031 Acute diastolic (congestive) heart failure: Secondary | ICD-10-CM

## 2023-01-02 DIAGNOSIS — I10 Essential (primary) hypertension: Secondary | ICD-10-CM | POA: Diagnosis present

## 2023-01-02 DIAGNOSIS — I120 Hypertensive chronic kidney disease with stage 5 chronic kidney disease or end stage renal disease: Secondary | ICD-10-CM | POA: Diagnosis not present

## 2023-01-02 DIAGNOSIS — Z79899 Other long term (current) drug therapy: Secondary | ICD-10-CM

## 2023-01-02 DIAGNOSIS — I5032 Chronic diastolic (congestive) heart failure: Secondary | ICD-10-CM | POA: Diagnosis present

## 2023-01-02 DIAGNOSIS — J9621 Acute and chronic respiratory failure with hypoxia: Secondary | ICD-10-CM | POA: Diagnosis present

## 2023-01-02 DIAGNOSIS — D649 Anemia, unspecified: Secondary | ICD-10-CM | POA: Diagnosis present

## 2023-01-02 DIAGNOSIS — E785 Hyperlipidemia, unspecified: Secondary | ICD-10-CM | POA: Diagnosis present

## 2023-01-02 DIAGNOSIS — I50810 Right heart failure, unspecified: Secondary | ICD-10-CM

## 2023-01-02 DIAGNOSIS — K922 Gastrointestinal hemorrhage, unspecified: Secondary | ICD-10-CM | POA: Diagnosis present

## 2023-01-02 DIAGNOSIS — I5030 Unspecified diastolic (congestive) heart failure: Secondary | ICD-10-CM | POA: Diagnosis not present

## 2023-01-02 DIAGNOSIS — K3182 Dieulafoy lesion (hemorrhagic) of stomach and duodenum: Secondary | ICD-10-CM | POA: Diagnosis present

## 2023-01-02 DIAGNOSIS — N186 End stage renal disease: Secondary | ICD-10-CM | POA: Diagnosis present

## 2023-01-02 DIAGNOSIS — Z808 Family history of malignant neoplasm of other organs or systems: Secondary | ICD-10-CM

## 2023-01-02 DIAGNOSIS — Z8249 Family history of ischemic heart disease and other diseases of the circulatory system: Secondary | ICD-10-CM

## 2023-01-02 DIAGNOSIS — E871 Hypo-osmolality and hyponatremia: Secondary | ICD-10-CM | POA: Diagnosis present

## 2023-01-02 DIAGNOSIS — J449 Chronic obstructive pulmonary disease, unspecified: Secondary | ICD-10-CM | POA: Diagnosis present

## 2023-01-02 DIAGNOSIS — D539 Nutritional anemia, unspecified: Secondary | ICD-10-CM | POA: Diagnosis present

## 2023-01-02 DIAGNOSIS — D631 Anemia in chronic kidney disease: Secondary | ICD-10-CM | POA: Diagnosis present

## 2023-01-02 DIAGNOSIS — N952 Postmenopausal atrophic vaginitis: Secondary | ICD-10-CM | POA: Diagnosis present

## 2023-01-02 DIAGNOSIS — J441 Chronic obstructive pulmonary disease with (acute) exacerbation: Secondary | ICD-10-CM | POA: Diagnosis present

## 2023-01-02 DIAGNOSIS — I5082 Biventricular heart failure: Secondary | ICD-10-CM | POA: Diagnosis present

## 2023-01-02 DIAGNOSIS — I2489 Other forms of acute ischemic heart disease: Secondary | ICD-10-CM | POA: Diagnosis present

## 2023-01-02 DIAGNOSIS — I4891 Unspecified atrial fibrillation: Secondary | ICD-10-CM | POA: Diagnosis present

## 2023-01-02 DIAGNOSIS — M898X9 Other specified disorders of bone, unspecified site: Secondary | ICD-10-CM | POA: Diagnosis present

## 2023-01-02 DIAGNOSIS — K449 Diaphragmatic hernia without obstruction or gangrene: Secondary | ICD-10-CM | POA: Diagnosis present

## 2023-01-02 DIAGNOSIS — I132 Hypertensive heart and chronic kidney disease with heart failure and with stage 5 chronic kidney disease, or end stage renal disease: Secondary | ICD-10-CM | POA: Diagnosis present

## 2023-01-02 DIAGNOSIS — R7989 Other specified abnormal findings of blood chemistry: Secondary | ICD-10-CM | POA: Diagnosis not present

## 2023-01-02 DIAGNOSIS — I25810 Atherosclerosis of coronary artery bypass graft(s) without angina pectoris: Secondary | ICD-10-CM | POA: Diagnosis not present

## 2023-01-02 DIAGNOSIS — J9622 Acute and chronic respiratory failure with hypercapnia: Secondary | ICD-10-CM | POA: Diagnosis present

## 2023-01-02 DIAGNOSIS — K219 Gastro-esophageal reflux disease without esophagitis: Secondary | ICD-10-CM | POA: Diagnosis present

## 2023-01-02 DIAGNOSIS — Z951 Presence of aortocoronary bypass graft: Secondary | ICD-10-CM

## 2023-01-02 DIAGNOSIS — Z7982 Long term (current) use of aspirin: Secondary | ICD-10-CM

## 2023-01-02 DIAGNOSIS — M109 Gout, unspecified: Secondary | ICD-10-CM | POA: Diagnosis present

## 2023-01-02 DIAGNOSIS — J9602 Acute respiratory failure with hypercapnia: Secondary | ICD-10-CM | POA: Diagnosis present

## 2023-01-02 DIAGNOSIS — Z992 Dependence on renal dialysis: Secondary | ICD-10-CM

## 2023-01-02 DIAGNOSIS — I5033 Acute on chronic diastolic (congestive) heart failure: Secondary | ICD-10-CM | POA: Diagnosis not present

## 2023-01-02 DIAGNOSIS — Z9981 Dependence on supplemental oxygen: Secondary | ICD-10-CM

## 2023-01-02 DIAGNOSIS — Z801 Family history of malignant neoplasm of trachea, bronchus and lung: Secondary | ICD-10-CM

## 2023-01-02 DIAGNOSIS — Z886 Allergy status to analgesic agent status: Secondary | ICD-10-CM

## 2023-01-02 DIAGNOSIS — G9340 Encephalopathy, unspecified: Secondary | ICD-10-CM | POA: Diagnosis present

## 2023-01-02 DIAGNOSIS — I5A Non-ischemic myocardial injury (non-traumatic): Secondary | ICD-10-CM | POA: Diagnosis present

## 2023-01-02 DIAGNOSIS — E875 Hyperkalemia: Secondary | ICD-10-CM | POA: Diagnosis present

## 2023-01-02 DIAGNOSIS — I959 Hypotension, unspecified: Principal | ICD-10-CM

## 2023-01-02 DIAGNOSIS — I350 Nonrheumatic aortic (valve) stenosis: Secondary | ICD-10-CM | POA: Diagnosis not present

## 2023-01-02 DIAGNOSIS — E8779 Other fluid overload: Secondary | ICD-10-CM | POA: Diagnosis present

## 2023-01-02 DIAGNOSIS — Z981 Arthrodesis status: Secondary | ICD-10-CM

## 2023-01-02 DIAGNOSIS — I272 Pulmonary hypertension, unspecified: Secondary | ICD-10-CM | POA: Diagnosis present

## 2023-01-02 DIAGNOSIS — Z825 Family history of asthma and other chronic lower respiratory diseases: Secondary | ICD-10-CM

## 2023-01-02 DIAGNOSIS — J9691 Respiratory failure, unspecified with hypoxia: Secondary | ICD-10-CM | POA: Insufficient documentation

## 2023-01-02 DIAGNOSIS — I2510 Atherosclerotic heart disease of native coronary artery without angina pectoris: Secondary | ICD-10-CM | POA: Diagnosis present

## 2023-01-02 DIAGNOSIS — Z96652 Presence of left artificial knee joint: Secondary | ICD-10-CM | POA: Diagnosis present

## 2023-01-02 DIAGNOSIS — K648 Other hemorrhoids: Secondary | ICD-10-CM | POA: Diagnosis present

## 2023-01-02 DIAGNOSIS — Z833 Family history of diabetes mellitus: Secondary | ICD-10-CM

## 2023-01-02 DIAGNOSIS — K439 Ventral hernia without obstruction or gangrene: Secondary | ICD-10-CM | POA: Diagnosis present

## 2023-01-02 DIAGNOSIS — R079 Chest pain, unspecified: Secondary | ICD-10-CM | POA: Diagnosis not present

## 2023-01-02 DIAGNOSIS — K317 Polyp of stomach and duodenum: Secondary | ICD-10-CM | POA: Diagnosis present

## 2023-01-02 DIAGNOSIS — F419 Anxiety disorder, unspecified: Secondary | ICD-10-CM | POA: Diagnosis present

## 2023-01-02 DIAGNOSIS — I509 Heart failure, unspecified: Secondary | ICD-10-CM

## 2023-01-02 DIAGNOSIS — Z87891 Personal history of nicotine dependence: Secondary | ICD-10-CM

## 2023-01-02 DIAGNOSIS — Z5986 Financial insecurity: Secondary | ICD-10-CM

## 2023-01-02 DIAGNOSIS — N19 Unspecified kidney failure: Secondary | ICD-10-CM

## 2023-01-02 LAB — I-STAT VENOUS BLOOD GAS, ED
Acid-Base Excess: 0 mmol/L (ref 0.0–2.0)
Acid-base deficit: 1 mmol/L (ref 0.0–2.0)
Bicarbonate: 27.3 mmol/L (ref 20.0–28.0)
Bicarbonate: 25.1 mmol/L (ref 20.0–28.0)
Calcium, Ion: 1.15 mmol/L (ref 1.15–1.40)
Calcium, Ion: 1.11 mmol/L — ABNORMAL LOW (ref 1.15–1.40)
HCT: 28 % — ABNORMAL LOW (ref 36.0–46.0)
HCT: 26 % — ABNORMAL LOW (ref 36.0–46.0)
Hemoglobin: 9.5 g/dL — ABNORMAL LOW (ref 12.0–15.0)
Hemoglobin: 8.8 g/dL — ABNORMAL LOW (ref 12.0–15.0)
O2 Saturation: 97 %
O2 Saturation: 93 %
Potassium: 7.2 mmol/L (ref 3.5–5.1)
Potassium: 6.4 mmol/L (ref 3.5–5.1)
Sodium: 131 mmol/L — ABNORMAL LOW (ref 135–145)
Sodium: 132 mmol/L — ABNORMAL LOW (ref 135–145)
TCO2: 29 mmol/L (ref 22–32)
TCO2: 27 mmol/L (ref 22–32)
pCO2, Ven: 60.3 mm[Hg] — ABNORMAL HIGH (ref 44–60)
pCO2, Ven: 48.2 mm[Hg] (ref 44–60)
pH, Ven: 7.264 (ref 7.25–7.43)
pH, Ven: 7.324 (ref 7.25–7.43)
pO2, Ven: 111 mm[Hg] — ABNORMAL HIGH (ref 32–45)
pO2, Ven: 73 mm[Hg] — ABNORMAL HIGH (ref 32–45)

## 2023-01-02 LAB — HEPATIC FUNCTION PANEL
ALT: 11 U/L (ref 0–44)
AST: 22 U/L (ref 15–41)
Albumin: 2.4 g/dL — ABNORMAL LOW (ref 3.5–5.0)
Alkaline Phosphatase: 48 U/L (ref 38–126)
Bilirubin, Direct: 0.6 mg/dL — ABNORMAL HIGH (ref 0.0–0.2)
Indirect Bilirubin: 0.6 mg/dL (ref 0.3–0.9)
Total Bilirubin: 1.2 mg/dL — ABNORMAL HIGH (ref <1.2)
Total Protein: 5 g/dL — ABNORMAL LOW (ref 6.5–8.1)

## 2023-01-02 LAB — CBC
HCT: 28.8 % — ABNORMAL LOW (ref 36.0–46.0)
Hemoglobin: 8.2 g/dL — ABNORMAL LOW (ref 12.0–15.0)
MCH: 30.9 pg (ref 26.0–34.0)
MCHC: 28.5 g/dL — ABNORMAL LOW (ref 30.0–36.0)
MCV: 108.7 fL — ABNORMAL HIGH (ref 80.0–100.0)
Platelets: 121 10*3/uL — ABNORMAL LOW (ref 150–400)
RBC: 2.65 MIL/uL — ABNORMAL LOW (ref 3.87–5.11)
RDW: 15.9 % — ABNORMAL HIGH (ref 11.5–15.5)
WBC: 6 10*3/uL (ref 4.0–10.5)
nRBC: 0 % (ref 0.0–0.2)

## 2023-01-02 LAB — BASIC METABOLIC PANEL
Anion gap: 8 (ref 5–15)
BUN: 50 mg/dL — ABNORMAL HIGH (ref 8–23)
CO2: 24 mmol/L (ref 22–32)
Calcium: 8.7 mg/dL — ABNORMAL LOW (ref 8.9–10.3)
Chloride: 101 mmol/L (ref 98–111)
Creatinine, Ser: 8.17 mg/dL — ABNORMAL HIGH (ref 0.44–1.00)
GFR, Estimated: 5 mL/min — ABNORMAL LOW (ref >60)
Glucose, Bld: 92 mg/dL (ref 70–99)
Potassium: 4.3 mmol/L (ref 3.5–5.1)
Sodium: 133 mmol/L — ABNORMAL LOW (ref 135–145)

## 2023-01-02 LAB — AMMONIA: Ammonia: 46 umol/L — ABNORMAL HIGH (ref 9–35)

## 2023-01-02 LAB — POC OCCULT BLOOD, ED: Fecal Occult Bld: POSITIVE — AB

## 2023-01-02 LAB — POTASSIUM: Potassium: 5.6 mmol/L — ABNORMAL HIGH (ref 3.5–5.1)

## 2023-01-02 LAB — HEPATITIS B SURFACE ANTIGEN: Hepatitis B Surface Ag: NONREACTIVE

## 2023-01-02 LAB — RENAL FUNCTION PANEL
Albumin: 2.4 g/dL — ABNORMAL LOW (ref 3.5–5.0)
Anion gap: 9 (ref 5–15)
BUN: 51 mg/dL — ABNORMAL HIGH (ref 8–23)
CO2: 22 mmol/L (ref 22–32)
Calcium: 8.3 mg/dL — ABNORMAL LOW (ref 8.9–10.3)
Chloride: 100 mmol/L (ref 98–111)
Creatinine, Ser: 8.19 mg/dL — ABNORMAL HIGH (ref 0.44–1.00)
GFR, Estimated: 5 mL/min — ABNORMAL LOW (ref >60)
Glucose, Bld: 90 mg/dL (ref 70–99)
Phosphorus: 4 mg/dL (ref 2.5–4.6)
Potassium: 6.2 mmol/L — ABNORMAL HIGH (ref 3.5–5.1)
Sodium: 131 mmol/L — ABNORMAL LOW (ref 135–145)

## 2023-01-02 LAB — TROPONIN I (HIGH SENSITIVITY)
Troponin I (High Sensitivity): 252 ng/L (ref <18)
Troponin I (High Sensitivity): 230 ng/L (ref <18)

## 2023-01-02 LAB — CBG MONITORING, ED: Glucose-Capillary: 103 mg/dL — ABNORMAL HIGH (ref 70–99)

## 2023-01-02 MED ORDER — LACTATED RINGERS IV BOLUS
500.0000 mL | Freq: Once | INTRAVENOUS | Status: AC
Start: 2023-01-02 — End: 2023-01-02
  Administered 2023-01-02: 500 mL via INTRAVENOUS

## 2023-01-02 MED ORDER — PANTOPRAZOLE SODIUM 40 MG IV SOLR
80.0000 mg | Freq: Once | INTRAVENOUS | Status: AC
  Administered 2023-01-02: 80 mg via INTRAVENOUS
  Filled 2023-01-02: qty 20

## 2023-01-02 MED ORDER — CHLORHEXIDINE GLUCONATE CLOTH 2 % EX PADS: 6.0000 | MEDICATED_PAD | Freq: Every day | CUTANEOUS | Status: DC

## 2023-01-02 MED ORDER — ACETAMINOPHEN 500 MG PO TABS
1000.0000 mg | ORAL_TABLET | Freq: Once | ORAL | Status: AC
Start: 2023-01-02 — End: 2023-01-02
  Administered 2023-01-02: 1000 mg via ORAL
  Filled 2023-01-02: qty 2

## 2023-01-02 NOTE — ED Triage Notes (Addendum)
Pt BIB [facility] from Dialysis MWF, Pt missed Dialysis on Wed. 1/3 of tx completed today.  Episode of SOB, NV, Hypotension SBP 80 per Dialysis staff.  Lung sounds clear but diminished w/o distress per EMS.    Pt does not feel well.  EMS endorsed Vomit and diarhhea at Dialysis facility.   Recent heart valve procedure. Pt does not endorse CP now but has had some the last few days.  106/54 98% 2L HR 64 RR 20 GCS 15, Pt wears 2L at baseline

## 2023-01-02 NOTE — Hospital Course (Signed)
Principal Problem:   GI bleed Active Problems:   Essential hypertension   Acute hypercapnic respiratory failure (HCC)   GERD (gastroesophageal reflux disease)   COPD (chronic obstructive pulmonary disease) (HCC)   Acute anemia   ESRD (end stage renal disease) (HCC)   OSA (obstructive sleep apnea)   Chronic heart failure (HCC)   S/P TAVR (transcatheter aortic valve replacement)   Demand ischemia (HCC)   Thrombocytopenia (HCC)  Resolved Problems:   * No resolved hospital problems. *  Consults:***  Procedures:***  Follow-up items:***  GI bleeding Currently hemodynamically stable.  Hemoglobin drifting down over the last week to 10 days.  She was started on aspirin after a TAVR recently.  She has history of GI bleeding in the past due to peptic ulcer disease and Dieulafoy lesion.  I also worry about liver disease in this person which increases her risk profile.  Will start IV PPI twice daily, hold antiplatelets, no NSAIDs.  Clear liquid diet, n.p.o. at midnight and GI consult in the a.m.  Acute on chronic anemia With macrocytosis, but suspect iron deficiency anemia in setting of GI bleed.  Iron panel pending.  No indication for transfusion at present, but threshold may be higher for this person with CAD.  COPD With hypercapnic respiratory failure, did well on short course of BiPAP in the ED.  Will start triple therapy LAMA, LABA, ICS via neb.  Every 6 hour DuoNebs as needed.  ESRD Has missed 2 sessions of dialysis this week because of acute health problems.  Initial potassium was high.  She has some signs of volume overload like lung crackles and dyspnea.  Nephrology following, will try to dialyze her urgently.  Chronic heart failure With comorbid valvular disease and RV systolic function per last echo earlier this month around time of TAVR.  Volume management via HD.  Demand ischemia With comorbid coronary artery disease.  No chest pain at present.  Suspect elevated troponin from  supply demand mismatch and impaired clearance in ESRD.  Intolerant of statins, holding baby aspirin out of concern for GI bleeding.  Thrombocytopenia Has had this off and on as far back as 2015.  Reports history of fatty liver.  Her fib 4 score is high, wonder if she has some degree of liver fibrosis.  She also has some small free fluid in her abdomen.  INR in a.m.  Will get smear for anemia and thrombocytopenia, do not suspect anything like DIC or TTP at present though.  Hypertension Normotensive to hypotensive since arrival in the ED.  Will get dialysis overnight, probably.  Restart carvedilol and Imdur tomorrow selectively because of her concomitant demand ischemia.  She is also on olmesartan, hydralazine,, clonidine, and nifedipine which I will defer restarting for now.

## 2023-01-02 NOTE — ED Provider Triage Note (Signed)
Emergency Medicine Provider Triage Evaluation Note  Jodi Bell , a 62 y.o. female with a history of ESRD and recent valve replacement was evaluated in triage.  Pt complains of generalized weakness, low blood pressure, and chest pain  Review of Systems  Positive: Chest pain Negative: To above  Physical Exam  BP (!) 107/53   Pulse 65   Temp 97.7 F (36.5 C) (Oral)   Resp 16   Ht 5\' 1"  (1.549 m)   Wt 60 kg   SpO2 96%   BMI 24.99 kg/m  Gen:   Awake, no distress   Resp:  Normal effort  MSK:   Moves extremities without difficulty  Other:  Murmur noted at right base  Medical Decision Making  Medically screening exam initiated at 2:30 PM.  Appropriate orders placed.  [name] was informed that the remainder of the evaluation will be completed by another provider, this initial triage assessment does not replace that evaluation, and the importance of remaining in the ED until their evaluation is complete.  Instructed to bring to a room and not to go to waiting room   Rondel Baton, MD 01/02/23 1431

## 2023-01-02 NOTE — H&P (Incomplete)
Date: 01/03/2023               Patient Name:  Jodi Bell MRN: [id number]  DOB: November 14, 1960 Age / Sex: 62 y.o., female   PCP: Malka So., MD         Medical Service: Internal Medicine Teaching Service         Attending Physician: Dr. Ginnie Smart, MD      First Contact: Monna Fam, MD  Pager: [phone]    Second Contact: Rocky Morel, DO  Pager: [phone]         After Hours (After 5p/  First Contact Pager: [phone]  weekends / holidays): Second Contact Pager: [phone]   SUBJECTIVE   Chief Complaint: Vomiting   History of Present Illness: Jodi Bell is a 62 YO F with a PMH of diabetes, ESRD on MWF HD, COPD on 3 of O2, aortic stenosis s/p TAVR (12/23/22), CAD s/p CABG who presented to the ED with vomiting and hypotension. The patient states that over the past month, she has become progressively more dependent on her ADLs and iADLs. She started feeling unwell on Sunday in which she said she had bilateral eye pain that improved after taking eye drops. She was at her baseline until Wednesday morning in which she woke up with similar eye pain that resolved after using eye drops. The eye pain caused her to miss dialysis on Wednesday. Today, she woke up feeling more fatigued and nauseous. She vomited two times at home that produced a clear substance. At dialysis, it was noted that she continued to feel unwell but became hypotensive with a systolic in the 80s, so she came to the ED. She was only able to complete ~1/3 of the treatment. At dialysis, she also endorsed an acute urge to defecate but was unable to make it to the restroom. She believes her stools have been darker than usual, and she does have a history of GI bleeding but she is unclear of the source. She denies any chest pain, shortness of breath, fevers, or abdominal pain.   ED Course: Per EMS, had an episode of emesis and diarrhea at the dialysis center. Blood pressure were a bit soft at 106/54 but normal HR and RR.   Potassium high at 7.2 and decreased to 5.6. Tropes elevated at 252 but plateau with repeat at 230. Liver panel notable for decreased albumin at 2.4 and mildly elevated bili at 1.2 with total bili of 0.6. Mild hyponatremia at 131. VBG notable for elevated pO2 of 111. CBC notable for decreased platelets at 121.   Meds:  Current Outpatient Medications  Medication Instructions   acetaminophen (TYLENOL) 1,000 mg, Oral, Daily PRN   albuterol (PROVENTIL) 2.5 mg, Nebulization, Every 6 hours PRN   allopurinol (ZYLOPRIM) 100 mg, Oral, Every evening   amoxicillin (AMOXIL) 500 MG tablet Take 4 tablets by mouth 1 hour prior to dental procedures and cleanings.   Artificial Tear Solution (TEARS NATURALE OP) 1 drop, Both Eyes, As needed   aspirin 81 mg, Oral, Daily   busPIRone (BUSPAR) 5 mg, Oral, 3 times daily   carboxymethylcellulose 1 % ophthalmic solution 1 drop, Both Eyes, 3 times daily   carvedilol (COREG) 25 mg, Oral, 2 times daily with meals   cetirizine (ZYRTEC) 10 mg, Oral, Daily PRN   cinacalcet (SENSIPAR) 60 mg, Oral, Every M-W-F (Hemodialysis)   cloNIDine (CATAPRES - DOSED IN MG/24 HR) 0.3 mg, Transdermal, Every Sun   diclofenac Sodium (VOLTAREN) 2 g, Apply externally,  Daily PRN   docusate sodium (COLACE) 100 mg, Oral, Daily   EPINEPHrine 0.3 mg/0.3 mL IJ SOAJ injection Intramuscular, As needed   fluticasone (FLONASE) 50 MCG/ACT nasal spray 1 spray, Each Nare, Daily PRN   hydrALAZINE (APRESOLINE) 100 mg, Oral, Every 8 hours   hydrocortisone (ANUSOL-HC) 2.5 % rectal cream Rectal, 2 times daily   isosorbide mononitrate (IMDUR) 60 mg, Oral, Daily   LORazepam (ATIVAN) 0.5 mg, Oral, Every 8 hours PRN   montelukast (SINGULAIR) 10 mg, Oral, Daily with supper   multivitamin (RENA-VIT) TABS tablet 1 tablet, Oral, Every M-W-F   neomycin-polymyxin b-dexamethasone (MAXITROL) 3.5-10000-0.1 SUSP 1 drop, Both Eyes, 4 times daily   NIFEdipine (PROCARDIA XL/NIFEDICAL XL) 60 mg, Oral, 2 times daily    olmesartan (BENICAR) 40 mg, Oral, Every evening   ondansetron (ZOFRAN) 4 mg, Oral, 2 times daily PRN   OXYGEN 2-3 L, Inhalation, Continuous   pantoprazole (PROTONIX) 40 mg, Oral, 2 times daily   predniSONE (DELTASONE) 5 mg, Oral, Daily with breakfast   Probiotic Product (PROBIOTIC PO) 1 capsule, Oral, Daily   silver sulfADIAZINE (SILVADENE) 1 % cream 1 Application, Topical, Daily PRN   Tiotropium Bromide-Olodaterol (STIOLTO RESPIMAT) 2.5-2.5 MCG/ACT AERS Inhale 2 puffs once daily into the lungs.   VENTOLIN HFA 108 (90 Base) MCG/ACT inhaler 2 puffs, Inhalation, See admin instructions, Take 2 puffs every 4-6 hours as needed for wheezing and shortness of breath   Vitamin D 2,000 Units, Oral, Daily   White Petrolatum-Mineral Oil (SOOTHE NIGHTTIME) OINT 1 Application, Both Eyes, Daily at bedtime   zolpidem (AMBIEN) 5 mg, Oral, At bedtime PRN   Past Medical History:  Diagnosis Date   Anemia of chronic disease    Anxiety    Arthritis    oa and psoriatic ra   Asthma    Back pain, chronic    lower back   Chronic insomnia    COPD (chronic obstructive pulmonary disease) (HCC)    Diabetes mellitus    type 2   ESRD on dialysis [facility])    Essential hypertension    GERD (gastroesophageal reflux disease)    Gout    History of hiatal hernia    Hypoalbuminemia    Hyponatremia    Mild aortic stenosis    Mitral regurgitation    Mitral stenosis    Obesity    PAD (peripheral artery disease) (HCC)    a. externial iliac calcification seen on CT 04/2020.   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 Lynnette Caffey & Laneta Simmers.   Sleep apnea    Tobacco abuse    Upper GI bleed 05/2019   Past Surgical History:  Procedure Laterality Date   BACK SURGERY  2016   lower back fusion with cage   BIOPSY  09/23/2017   Procedure: BIOPSY;  Surgeon: Graylin Shiver, MD;  Location: [facility] ENDOSCOPY;  Service: Endoscopy;;   BIOPSY  05/19/2019    Procedure: BIOPSY;  Surgeon: Kathi Der, MD;  Location: [facility] ENDOSCOPY;  Service: Gastroenterology;;   CESAREAN SECTION  1990   x 1    COLONOSCOPY WITH PROPOFOL N/A 09/23/2017   Procedure: COLONOSCOPY WITH PROPOFOL Hemostatic clips placed;  Surgeon: Graylin Shiver, MD;  Location: [facility] ENDOSCOPY;  Service: Endoscopy;  Laterality: N/A;   COLONOSCOPY WITH PROPOFOL N/A 10/02/2020   Procedure: COLONOSCOPY WITH PROPOFOL;  Surgeon: Willis Modena, MD;  Location: [facility] ENDOSCOPY;  Service: Endoscopy;  Laterality: N/A;  CORONARY ARTERY BYPASS GRAFT N/A 09/13/2020   Procedure: CORONARY ARTERY BYPASS GRAFTING (CABG), ON PUMP, TIMES TWO, USING LEFT INTERNAL MAMMARY ARTERY AND RIGHT ENDOSCOPICALLY HARVESTED GREATER SAPHENOUS VEIN;  Surgeon: Alleen Borne, MD;  Location: [facility] OR;  Service: Open Heart Surgery;  Laterality: N/A;   DILATION AND CURETTAGE OF UTERUS  1988   ENTEROSCOPY N/A 07/22/2021   Procedure: ENTEROSCOPY;  Surgeon: Kerin Salen, MD;  Location: [facility] ENDOSCOPY;  Service: Gastroenterology;  Laterality: N/A;   ESOPHAGOGASTRODUODENOSCOPY N/A 09/22/2020   Procedure: ESOPHAGOGASTRODUODENOSCOPY (EGD);  Surgeon: Willis Modena, MD;  Location: [facility] ENDOSCOPY;  Service: Endoscopy;  Laterality: N/A;   ESOPHAGOGASTRODUODENOSCOPY (EGD) WITH PROPOFOL N/A 09/23/2017   Procedure: ESOPHAGOGASTRODUODENOSCOPY (EGD) WITH PROPOFOL;  Surgeon: Graylin Shiver, MD;  Location: [facility] ENDOSCOPY;  Service: Endoscopy;  Laterality: N/A;   ESOPHAGOGASTRODUODENOSCOPY (EGD) WITH PROPOFOL N/A 05/19/2019   Procedure: ESOPHAGOGASTRODUODENOSCOPY (EGD) WITH PROPOFOL;  Surgeon: Kathi Der, MD;  Location: [facility] ENDOSCOPY;  Service: Gastroenterology;  Laterality: N/A;   ESOPHAGOGASTRODUODENOSCOPY (EGD) WITH PROPOFOL N/A 10/02/2020   Procedure: ESOPHAGOGASTRODUODENOSCOPY (EGD) WITH PROPOFOL;  Surgeon: Willis Modena, MD;  Location: [facility] ENDOSCOPY;  Service: Endoscopy;  Laterality: N/A;   GIVENS CAPSULE STUDY N/A 05/19/2019   Procedure: GIVENS CAPSULE  STUDY;  Surgeon: Kathi Der, MD;  Location: [facility] ENDOSCOPY;  Service: Gastroenterology;  Laterality: N/A;   GIVENS CAPSULE STUDY N/A 07/19/2021   Procedure: GIVENS CAPSULE STUDY;  Surgeon: [name], MD;  Location: [facility] ENDOSCOPY;  Service: Gastroenterology;  Laterality: N/A;   HEMOSTASIS CLIP PLACEMENT  09/22/2020   Procedure: HEMOSTASIS CLIP PLACEMENT;  Surgeon: Willis Modena, MD;  Location: [facility] ENDOSCOPY;  Service: Endoscopy;;   HEMOSTASIS CONTROL  09/22/2020   Procedure: HEMOSTASIS CONTROL;  Surgeon: Willis Modena, MD;  Location: [facility] ENDOSCOPY;  Service: Endoscopy;;   HOT HEMOSTASIS N/A 05/19/2019   Procedure: HOT HEMOSTASIS (ARGON PLASMA COAGULATION/BICAP);  Surgeon: Kathi Der, MD;  Location: [facility] ENDOSCOPY;  Service: Gastroenterology;  Laterality: N/A;   INTRAOPERATIVE TRANSTHORACIC ECHOCARDIOGRAM N/A 12/23/2022   Procedure: INTRAOPERATIVE TRANSTHORACIC ECHOCARDIOGRAM;  Surgeon: [name], MD;  Location: [facility] INVASIVE CV LAB;  Service: Cardiovascular;  Laterality: N/A;   LEFT HEART CATH AND CORONARY ANGIOGRAPHY N/A 09/10/2020   Procedure: LEFT HEART CATH AND CORONARY ANGIOGRAPHY;  Surgeon: Yvonne Kendall, MD;  Location: [facility] INVASIVE CV LAB;  Service: Cardiovascular;  Laterality: N/A;   PARTIAL KNEE ARTHROPLASTY Left 11/12/2017   Procedure: left unicompartmental arthroplasty-medial;  Surgeon: Durene Romans, MD;  Location: [facility] ORS;  Service: Orthopedics;  Laterality: Left;    RIGHT HEART CATH AND CORONARY/GRAFT ANGIOGRAPHY N/A 06/26/2022   Procedure: RIGHT HEART CATH AND CORONARY/GRAFT ANGIOGRAPHY;  Surgeon: [name], MD;  Location: [facility] INVASIVE CV LAB;  Service: Cardiovascular;  Laterality: N/A;   SUBMUCOSAL INJECTION  09/23/2017   Procedure: SUBMUCOSAL INJECTION;  Surgeon: Graylin Shiver, MD;  Location: [facility] ENDOSCOPY;  Service: Endoscopy;;  in colon   TEE WITHOUT CARDIOVERSION N/A 09/13/2020   Procedure: TRANSESOPHAGEAL ECHOCARDIOGRAM (TEE);  Surgeon: Alleen Borne, MD;   Location: [facility] OR;  Service: Open Heart Surgery;  Laterality: N/A;   TUBAL LIGATION  1990   Social:  Lives With: Daughter  Largely dependent on ADLs and iADLs  Former smoker with 50 pack-year history, denies any alcohol or recreational drug use   Family History  Problem Relation Age of Onset   Lung cancer Mother    Diabetes Sister    Heart disease Sister    Asthma Daughter    Arthritis Daughter    Arthritis Daughter  Thyroid cancer Other        1/2 sister   Heart disease Other        1/2 sister   Allergies as of 01/02/2023 - Reviewed 01/02/2023  Allergen Reaction Noted   3-methyl-2-benzothiazolinone hydrazone Other (See Comments) 09/07/2012   Banana Anaphylaxis, Swelling, and Other (See Comments) 01/21/2018   Black walnut flavor Anaphylaxis and Itching 05/18/2019   Hazelnut (filbert) Anaphylaxis 05/18/2019   Leflunomide and related Other (See Comments) 07/23/2012   Lisinopril Swelling 01/05/2014   No healthtouch food allergies Anaphylaxis 05/18/2019   Other Anaphylaxis, Swelling, and Other (See Comments) 09/07/2012   Pecan nut (diagnostic) Anaphylaxis and Itching 05/18/2019   Trazodone and nefazodone Other (See Comments) 07/23/2014   Adalimumab Other (See Comments) 07/23/2012   Escitalopram oxalate Other (See Comments) 09/29/2013   Ezetimibe Other (See Comments) 01/13/2018   Secukinumab Swelling 05/18/2019   Statins Other (See Comments) 09/04/2014   Ferrlecit [na ferric gluc cplx in sucrose]  09/19/2020   Gabapentin Other (See Comments) 06/26/2021   Nsaids  06/24/2022   Review of Systems: A complete ROS was negative except as per HPI.   OBJECTIVE:   Physical Exam: Blood pressure (!) 120/56, pulse 63, temperature (!) 96.5 F (35.8 C), temperature source Axillary, resp. rate 16, height 5\' 1"  (1.549 m), weight 60 kg, SpO2 100%.  Physical Exam Constitutional:      Appearance: Normal appearance.  HENT:     Head: Normocephalic and atraumatic.  Cardiovascular:      Rate and Rhythm: Normal rate.     Pulses: Normal pulses.     Heart sounds: Murmur heard.     Comments: Left AV fistula present, no signs of erythema or infection  Pulmonary:     Breath sounds: Rales present.  Abdominal:     General: Bowel sounds are normal. There is distension.     Tenderness: There is abdominal tenderness in the epigastric area.  Musculoskeletal:     Right lower leg: No edema.     Left lower leg: No edema.  Neurological:     Mental Status: She is lethargic.     Cranial Nerves: Cranial nerves 2-12 are intact.     Sensory: Sensation is intact.     Motor: Motor function is intact.  Psychiatric:        Attention and Perception: Attention normal.        Mood and Affect: Mood normal.        Speech: Speech normal.    Labs: CBC    Component Value Date/Time   WBC 6.0 01/02/2023 1514   RBC 2.65 (L) 01/02/2023 1514   HGB 8.8 (L) 01/02/2023 2045   HCT 26.0 (L) 01/02/2023 2045   PLT 121 (L) 01/02/2023 1514   MCV 108.7 (H) 01/02/2023 1514   MCV 94.5 10/12/2012 1211   MCH 30.9 01/02/2023 1514   MCHC 28.5 (L) 01/02/2023 1514   RDW 15.9 (H) 01/02/2023 1514   LYMPHSABS 0.6 (L) 09/20/2022 0906   MONOABS 0.6 09/20/2022 0906   EOSABS 0.6 (H) 09/20/2022 0906   BASOSABS 0.0 09/20/2022 0906    CMP     Component Value Date/Time   NA 132 (L) 01/02/2023 2045   K 5.6 (H) 01/02/2023 2101   CL 100 01/02/2023 1811   CO2 22 01/02/2023 1811   GLUCOSE 90 01/02/2023 1811   BUN 51 (H) 01/02/2023 1811   CREATININE 8.19 (H) 01/02/2023 1811   CREATININE 1.34 (H) 09/18/2011 1400   CALCIUM 8.3 (L) 01/02/2023 1811  PROT 5.0 (L) 01/02/2023 1811   ALBUMIN 2.4 (L) 01/02/2023 1811   ALBUMIN 2.4 (L) 01/02/2023 1811   AST 22 01/02/2023 1811   ALT 11 01/02/2023 1811   ALKPHOS 48 01/02/2023 1811   BILITOT 1.2 (H) 01/02/2023 1811   GFRNONAA 5 (L) 01/02/2023 1811   GFRAA 16 (L) 11/09/2019 1152   Imaging: CT Abdomen Pelvis WO Contrast  IMPRESSION: 1. Negative for bowel obstruction or  free air. Under distended colon versus mild colitis type changes. Diffuse diverticular disease. 2. Small volume free fluid within the abdomen and pelvis. Generalized subcutaneous edema consistent with anasarca. Small pleural effusions. Cardiomegaly. 3. Aortic atherosclerosis.  CT Head WO Contrast  IMPRESSION: 1. No CT evidence for acute intracranial abnormality. 2. Mild to moderate chronic small vessel ischemic changes of the white matter.  DG Chest 2 View  IMPRESSION: 1. Findings favor congestive heart failure/pulmonary edema.  EKG: personally reviewed my interpretation is sinus rhythm.   ASSESSMENT & PLAN:   Assessment & Plan by Problem: Principal Problem:   GI bleed Active Problems:   Essential hypertension   COPD (chronic obstructive pulmonary disease) (HCC)   Acute anemia   ESRD (end stage renal disease) (HCC)   Chronic heart failure (HCC)   Demand ischemia (HCC)   Thrombocytopenia (HCC)   Respiratory failure with hypoxia and hypercapnia (HCC)   Jodi Bell is a 62 YO F with a PMH of diabetes, ESRD on MWF HD, COPD on 3 of O2, aortic stenosis s/p TAVR (12/23/22), CAD s/p CABG who presented to the ED with vomiting and hypotension and admitted with concerns for GI bleed.   #GI bleeding Currently hemodynamically stable.  Hemoglobin drifting down over the last week to 10 days.  She was started on aspirin after a TAVR recently.  She has history of GI bleeding in the past due to peptic ulcer disease and Dieulafoy lesion.  I also worry about liver disease in this person which increases her risk profile.  Will start IV PPI twice daily, hold antiplatelets, no NSAIDs.  Clear liquid diet, n.p.o. at midnight and GI consult in the a.m.  #Acute on chronic anemia With macrocytosis, but suspect iron deficiency anemia in setting of GI bleed.  Iron panel pending.  No indication for transfusion at present, but threshold may be higher for this person with CAD.  #COPD With chronic  hypercapnic respiratory failure, did well on short course of BiPAP in the ED.  Will start triple therapy LAMA, LABA, ICS via neb.  Every 6 hour DuoNebs as needed.  #ESRD Has missed 2 sessions of dialysis this week because of acute health problems.  Initial potassium was high.  She has some signs of volume overload like lung crackles and dyspnea.  Nephrology following, will try to dialyze her urgently.  #Chronic heart failure With comorbid valvular disease and RV systolic function per last echo earlier this month around time of TAVR.  Volume management via HD. Repeat echo after symptom improvement.   #Demand ischemia With comorbid coronary artery disease.  No chest pain at present.  Suspect elevated troponin from supply demand mismatch and impaired clearance in ESRD.  Intolerant of statins, holding baby aspirin out of concern for GI bleeding.  #Thrombocytopenia Has had this off and on as far back as 2015.  Reports history of fatty liver.  Her fib 4 score is high, wonder if she has some degree of liver fibrosis.  She also has some small free fluid in her abdomen.  INR in a.m.  Will get smear for anemia and thrombocytopenia, do not suspect anything like DIC or TTP at present though.  #Hypertension Normotensive to hypotensive since arrival in the ED.  Will get dialysis overnight, probably.  Restart carvedilol and Imdur tomorrow selectively because of her concomitant demand ischemia.  She is also on olmesartan, hydralazine,, clonidine, and nifedipine which I will defer restarting for now.  Diet: Clear liquids, NPO at midnight VTE: SCDs IVF: None,None Code: Full  Prior to Admission Living Arrangement: Home, living daughter Anticipated Discharge Location: Home Barriers to Discharge: Dialysis  Dispo: Admit patient to inpatient with expected length of stay more than 2 midnights.  Signed: Morrie Sheldon, MD Internal Medicine Resident PGY-1  01/03/2023, 12:36 AM

## 2023-01-02 NOTE — Consult Note (Addendum)
Cardiology Consultation   Patient ID: Jodi Bell MRN: [id number]; DOB: [birth year]  Admit date: 01/02/2023 Date of Consult: 01/02/2023  PCP:  Malka So., MD   [facility] Providers Cardiologist:  Donato Schultz, MD        Patient Profile:   Jodi Bell is a 62 y.o. female with a history of CAD s/p CABG x2 (LIMA to LAD and SVG to PDA) in 2022 with occluded SVG to PDA but patent LIMA to LAD noted on cardiac catheterization in 06/2022, chronic HFpEF, post-op atrial fibrillation following CABG not on anticoagulation  severe aortic stenosis s/p recent TAVR on 12/23/2022, COPD with chronic hypoxic respiratory failure, hypertension, type 2 diabetes mellitus, ESRD on hemodialysis M/W/F, GI bleeding, and obesity who is being seen 01/02/2023 for the evaluation of elevated troponin at the request of Dr. Silverio Lay.   History of Present Illness:   Jodi Bell is a 62 year old female with the above history who is followed by Dr. Anne Fu and Dr. Lynnette Caffey. Patient underwent CABG x2 with LIMA to LAD and SVG to PDA in 09/2020. Post-op course was complicated by seizure like activity requiring Keppra,  post-op atrial fibrillation, GI bleed requiring blood transfusions and EGD/ coloscopy showing non-bleeding gastric ulcers and internal hemorrhoids.    She also has a history of critical aortic stenosis and recently underwent TAVR on 12/23/2022. R/ LHC prior to this showed patent LIMA to LAD but occluded SVG to PDA. Post-op Echo showed LVEF of 65% with severe LVH, moderate RV dysfunction, moderate to severe MAC with mild MR/ moderate MS, severe TR, and s/p TAVR with mean gradient of 40 mmHg and mild PVL. Valve was felt to be under expanded. Some of the increased gradient could be flow related but there is concerns for prosthetic valve stenosis. This was felt to be due to under-explansion due to heavy annular calcification and patient prosthesis mismatch. There was not felt to be any further intervention  that we could offer. She had evidence of volume overload with LVEDP of 34 mmHg at time of TAVR. This was managed via dialysis. Patient did develop a new LBBB after TAVR but had no evidence of high grade AV block. Coreg was resumed and she was discharged with a live [place] monitor.    Patient presented to the ED today from dialysis for hypotension and vomiting. Upon arrival to the ED, vitals stable. BP 107/53. EKG showed normal sinus rhythm, rate 63 bpm, with 1st degree AV block and known LBBB. Initial high-sensitivity troponin 252. Chest x-ray showed moderate diffuse pulmonary vascular congestion/ edema favoring pulmonary edema. WBC 6.0, Hgb 8.2, Plts 121. Na 133, K 4.3, Glucose 92, BUN 50, Cr 8.17. Venous blood gas showed pH 7.26, pCO2 of 60.3, pO2 111, and BiCarb 27.3.   At the time of this evaluation, patient is resting comfortably on BiPAP.  She is lethargic but will answer questions. She reportedly had sudden onset of vomiting while at dialysis and was hypotensive.  Systolic BP was reportedly in the 80s.  Per triage note, she was only able to complete one third of dialysis session today and then decided to transfer to the ED.  She also missed her dose of dialysis on Wednesday due to an eye appointment.  She had cataract surgery prior to her TAVR and on Wednesday was having some eye pain and blurry vision so she went to the eye doctor.  Sounds like they changed her eyedrops at that time.  Difficult to communicate  with patient while she is on BiPAP but it sounds like she has been a little short of breath the last few days.  She has chronic orthopnea but she does report waking up feeling short of breath.  She describes some atypical chest pain that she is grabs a sharp pain under her left breast.  This does not last for very long and she denies any exertional chest pain.  She denies any lightheadedness/ dizziness, or syncope. Her abdomen is noticeably distended on exam. She denies any diarrhea. She does report  she had some dark tarry stools this morning. She states she was told her hemoglobin was low at dialysis.  She is normally hypertensive, not hypotensive, and is on multiple BP medications at home. I asked if there was any change she could of accidentally taken more of these medications then she was supposed to. Patient said no. Son does report that she has accidentally done this before but it sounds like that was an isolated event.    Past Medical History:  Diagnosis Date   Anemia of chronic disease    Anxiety    Arthritis    oa and psoriatic ra   Asthma    Back pain, chronic    lower back   Chronic insomnia    COPD (chronic obstructive pulmonary disease) (HCC)    Diabetes mellitus    type 2   ESRD on dialysis [facility])    Essential hypertension    GERD (gastroesophageal reflux disease)    Gout    History of hiatal hernia    Hypoalbuminemia    Hyponatremia    Mild aortic stenosis    Mitral regurgitation    Mitral stenosis    Obesity    PAD (peripheral artery disease) (HCC)    a. externial iliac calcification seen on CT 04/2020.   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 Lynnette Caffey & Laneta Simmers.   Sleep apnea    Tobacco abuse    Upper GI bleed 05/2019    Past Surgical History:  Procedure Laterality Date   BACK SURGERY  2016   lower back fusion with cage   BIOPSY  09/23/2017   Procedure: BIOPSY;  Surgeon: Graylin Shiver, MD;  Location: [facility] ENDOSCOPY;  Service: Endoscopy;;   BIOPSY  05/19/2019   Procedure: BIOPSY;  Surgeon: Kathi Der, MD;  Location: [facility] ENDOSCOPY;  Service: Gastroenterology;;   CESAREAN SECTION  1990   x 1    COLONOSCOPY WITH PROPOFOL N/A 09/23/2017   Procedure: COLONOSCOPY WITH PROPOFOL Hemostatic clips placed;  Surgeon: Graylin Shiver, MD;  Location: [facility] ENDOSCOPY;  Service: Endoscopy;  Laterality: N/A;   COLONOSCOPY WITH PROPOFOL N/A 10/02/2020   Procedure: COLONOSCOPY WITH  PROPOFOL;  Surgeon: Willis Modena, MD;  Location: [facility] ENDOSCOPY;  Service: Endoscopy;  Laterality: N/A;   CORONARY ARTERY BYPASS GRAFT N/A 09/13/2020   Procedure: CORONARY ARTERY BYPASS GRAFTING (CABG), ON PUMP, TIMES TWO, USING LEFT INTERNAL MAMMARY ARTERY AND RIGHT ENDOSCOPICALLY HARVESTED GREATER SAPHENOUS VEIN;  Surgeon: Alleen Borne, MD;  Location: [facility] OR;  Service: Open Heart Surgery;  Laterality: N/A;   DILATION AND CURETTAGE OF UTERUS  1988   ENTEROSCOPY N/A 07/22/2021   Procedure: ENTEROSCOPY;  Surgeon: Kerin Salen, MD;  Location: [facility] ENDOSCOPY;  Service: Gastroenterology;  Laterality: N/A;   ESOPHAGOGASTRODUODENOSCOPY N/A 09/22/2020   Procedure: ESOPHAGOGASTRODUODENOSCOPY (EGD);  Surgeon: Willis Modena, MD;  Location: [facility] ENDOSCOPY;  Service: Endoscopy;  Laterality: N/A;   ESOPHAGOGASTRODUODENOSCOPY (EGD) WITH PROPOFOL N/A 09/23/2017   Procedure: ESOPHAGOGASTRODUODENOSCOPY (EGD) WITH PROPOFOL;  Surgeon: Graylin Shiver, MD;  Location: [facility];  Service: Endoscopy;  Laterality: N/A;   ESOPHAGOGASTRODUODENOSCOPY (EGD) WITH PROPOFOL N/A 05/19/2019   Procedure: ESOPHAGOGASTRODUODENOSCOPY (EGD) WITH PROPOFOL;  Surgeon: Kathi Der, MD;  Location: [facility];  Service: Gastroenterology;  Laterality: N/A;   ESOPHAGOGASTRODUODENOSCOPY (EGD) WITH PROPOFOL N/A 10/02/2020   Procedure: ESOPHAGOGASTRODUODENOSCOPY (EGD) WITH PROPOFOL;  Surgeon: Willis Modena, MD;  Location: [facility] ENDOSCOPY;  Service: Endoscopy;  Laterality: N/A;   GIVENS CAPSULE STUDY N/A 05/19/2019   Procedure: GIVENS CAPSULE STUDY;  Surgeon: Kathi Der, MD;  Location: [facility] ENDOSCOPY;  Service: Gastroenterology;  Laterality: N/A;   GIVENS CAPSULE STUDY N/A 07/19/2021   Procedure: GIVENS CAPSULE STUDY;  Surgeon: [name], MD;  Location: [facility] ENDOSCOPY;  Service: Gastroenterology;  Laterality: N/A;   HEMOSTASIS CLIP PLACEMENT  09/22/2020   Procedure: HEMOSTASIS CLIP PLACEMENT;  Surgeon: Willis Modena, MD;  Location: [facility]  ENDOSCOPY;  Service: Endoscopy;;   HEMOSTASIS CONTROL  09/22/2020   Procedure: HEMOSTASIS CONTROL;  Surgeon: Willis Modena, MD;  Location: [facility] ENDOSCOPY;  Service: Endoscopy;;   HOT HEMOSTASIS N/A 05/19/2019   Procedure: HOT HEMOSTASIS (ARGON PLASMA COAGULATION/BICAP);  Surgeon: Kathi Der, MD;  Location: [facility] ENDOSCOPY;  Service: Gastroenterology;  Laterality: N/A;   INTRAOPERATIVE TRANSTHORACIC ECHOCARDIOGRAM N/A 12/23/2022   Procedure: INTRAOPERATIVE TRANSTHORACIC ECHOCARDIOGRAM;  Surgeon: [name], MD;  [facility]: [facility] INVASIVE CV LAB;  Service: Cardiovascular;  Laterality: N/A;   LEFT HEART CATH AND CORONARY ANGIOGRAPHY N/A 09/10/2020   Procedure: LEFT HEART CATH AND CORONARY ANGIOGRAPHY;  Surgeon: Yvonne Kendall, MD;  [facility]: [facility] INVASIVE CV LAB;  Service: Cardiovascular;  Laterality: N/A;   PARTIAL KNEE ARTHROPLASTY Left 11/12/2017   Procedure: left unicompartmental arthroplasty-medial;  Surgeon: Durene Romans, MD;  Location: [facility] ORS;  Service: Orthopedics;  Laterality: Left;    RIGHT HEART CATH AND CORONARY/GRAFT ANGIOGRAPHY N/A 06/26/2022   Procedure: RIGHT HEART CATH AND CORONARY/GRAFT ANGIOGRAPHY;  Surgeon: [name], MD;  [facility]: [facility] INV[facility] CV LAB;  Service: Cardiovascular;  Laterality: N/A;   SUBMUCOSAL INJECTION  09/23/2017   Procedure: SUBMUCOSAL INJECTION;  Surgeon: Graylin Shiver, MD;  Location: [facility] ENDOS[facility];  Service: Endoscopy;;  in colon   TEE WITHOUT CARDIOVERSION N/A 09/13/2020   Procedure: TRANSESOPHAGEAL ECHOCARDIOGRAM (TEE);  Surgeon: Alleen Borne, MD;  Location: [facility] OR;  Service: Open Heart Surgery;  Laterality: N/A;   TUBAL LIGATION  1990     Home Medications:  Prior to Admission medications   Medication Sig Start Date End Date Taking? Authorizing Provider  acetaminophen (TYLENOL) 500 MG tablet Take 1,000 mg by mouth daily as needed (pain).    [provider]  albuterol (PROVENTIL) (2.5 MG/3ML) 0.083% nebulizer solution Take 3 mLs (2.5  mg total) by nebulization every 6 (six) hours as needed for wheezing or shortness of breath. 07/10/21   Hunsucker, Lesia Sago, MD  allopurinol (ZYLOPRIM) 100 MG tablet Take 100 mg by mouth every evening.    [provider]  amoxicillin (AMOXIL) 500 MG tablet Take 4 tablets by mouth 1 hour prior to dental procedures and cleanings. 12/29/22   Georgie Chard D, NP  Artificial Tear Solution (TEARS NATURALE OP) Place 1 drop into both eyes as needed (dry eye).    [provider]  aspirin 81 MG chewable tablet Chew 1 tablet (81 mg total) by mouth daily. 12/25/22   Janetta Hora, PA-C  carboxymethylcellulose 1 % ophthalmic solution  Place 1 drop into both eyes 3 (three) times daily.    [provider]  carvedilol (COREG) 25 MG tablet Take 1 tablet (25 mg total) by mouth 2 (two) times daily with a meal. 12/25/22   Janetta Hora, PA-C  cetirizine (ZYRTEC) 10 MG tablet Take 10 mg by mouth daily as needed for allergies.    [provider]  Cholecalciferol (VITAMIN D) 50 MCG (2000 UT) tablet Take 2,000 Units by mouth daily.    [provider]  cinacalcet (SENSIPAR) 30 MG tablet Take 60 mg by mouth every Monday, Wednesday, and Friday with hemodialysis.    [provider]  cloNIDine (CATAPRES - DOSED IN MG/24 HR) 0.3 mg/24hr patch Place 0.3 mg onto the skin every Sunday.    [provider]  diclofenac Sodium (VOLTAREN) 1 % GEL Apply 2 g topically daily as needed (pain).    [provider]  docusate sodium (COLACE) 100 MG capsule Take 100 mg by mouth daily.    [provider]  EPINEPHrine 0.3 mg/0.3 mL IJ SOAJ injection Inject into the muscle as needed for anaphylaxis. 07/12/21   [provider]  fluticasone (FLONASE) 50 MCG/ACT nasal spray Place 1 spray into both nostrils daily as needed for allergies or rhinitis.    [provider]  hydrALAZINE (APRESOLINE) 100 MG tablet Take 1 tablet (100 mg total) by mouth  every 8 (eight) hours. 07/06/20   Cresenzo, Cyndi Lennert, MD  hydrocortisone (ANUSOL-HC) 2.5 % rectal cream Place rectally 2 (two) times daily. 07/23/21   Park Pope, MD  isosorbide mononitrate (IMDUR) 60 MG 24 hr tablet Take 1 tablet (60 mg total) by mouth daily. 10/27/22   Jake Bathe, MD  LORazepam (ATIVAN) 0.5 MG tablet Take 0.5 mg by mouth every 8 (eight) hours as needed for anxiety.    [provider]  montelukast (SINGULAIR) 10 MG tablet Take 10 mg by mouth daily with supper.     [provider]  multivitamin (RENA-VIT) TABS tablet Take 1 tablet by mouth every Monday, Wednesday, and Friday.    [provider]  neomycin-polymyxin b-dexamethasone (MAXITROL) 3.5-10000-0.1 SUSP Place 1 drop into both eyes in the morning, at noon, in the evening, and at bedtime. 12/18/22   [provider]  NIFEdipine (PROCARDIA XL/NIFEDICAL XL) 60 MG 24 hr tablet Take 60 mg by mouth 2 (two) times daily. 10/01/21   [provider]  olmesartan (BENICAR) 40 MG tablet Take 40 mg by mouth every evening. 05/06/19   [provider]  ondansetron (ZOFRAN) 4 MG tablet Take 4 mg by mouth 2 (two) times daily as needed for nausea or vomiting.    [provider]  OXYGEN Inhale 2-3 L into the lungs continuous.    [provider]  pantoprazole (PROTONIX) 40 MG tablet Take 40 mg by mouth 2 (two) times daily. 06/09/20   [provider]  predniSONE (DELTASONE) 5 MG tablet Take 1 tablet (5 mg total) by mouth daily with breakfast. 02/27/22   Marrianne Mood, MD  Probiotic Product (PROBIOTIC PO) Take 1 capsule by mouth daily.    [provider]  silver sulfADIAZINE (SILVADENE) 1 % cream Apply 1 Application topically daily as needed (wound care).    [provider]  Tiotropium Bromide-Olodaterol (STIOLTO RESPIMAT) 2.5-2.5 MCG/ACT AERS Inhale 2 puffs once daily into the lungs. 11/28/21   Hunsucker, Lesia Sago, MD  VENTOLIN HFA 108 (90 Base) MCG/ACT  inhaler Inhale 2 puffs into the lungs See admin instructions.  Take 2 puffs every 4-6 hours as needed for wheezing and shortness of breath 04/08/22   Hunsucker, Lesia Sago, MD  White Petrolatum-Mineral Oil (SOOTHE NIGHTTIME) OINT Place 1 Application into both eyes at bedtime.    [provider]  zolpidem (AMBIEN) 5 MG tablet Take 5 mg by mouth at bedtime as needed for sleep. 02/04/21   [provider]    Inpatient Medications: Scheduled Meds:  [START ON 01/03/2023] Chlorhexidine Gluconate Cloth  6 each Topical Q0600   Continuous Infusions:  PRN Meds:   Allergies:    Allergies  Allergen Reactions   3-Methyl-2-Benzothiazolinone Hydrazone Other (See Comments)    Muscle weakness muscle cramping in legs Other reaction(s): Myalgias (Muscle Pain)   Banana Anaphylaxis, Swelling and Other (See Comments)    TONGUE AND MOUTH SWELLING   Black Walnut Flavor Anaphylaxis and Itching    Walnuts   Hazelnut (Filbert) Anaphylaxis    Hazelnuts   Leflunomide And Related Other (See Comments)    Severe Headache   Lisinopril Swelling    Angioedema  Swelling of the face   No Healthtouch Food Allergies Anaphylaxis    Spicy Mustard 4.7.2021 Pt reports that she eats Regular Yellow Mustard Swelling and itching of tongue   Other Anaphylaxis, Swelling and Other (See Comments)    Cosentyx= angioedema Mouth itches and tongue swelling Hazel nuts and pecans also Walnuts Renal failure Serotonin syndrome  Tremors Muscle weakness muscle cramping in legs Other reaction(s): Myalgias (Muscle Pain)   Pecan Nut (Diagnostic) Anaphylaxis and Itching    Pecans   Trazodone And Nefazodone Other (See Comments)    Serotonin Syndrome  Tremors   Adalimumab Other (See Comments)    Blisters on abdomen =humira   Escitalopram Oxalate Other (See Comments)    Hand problems - Serotonin Syndrome     Ezetimibe Other (See Comments)    myalgias cramps    Secukinumab Swelling    Cosentyx = angiodema     Statins Other (See Comments)    Leg pains and weakness   Ferrlecit [Na Ferric Gluc Cplx In Sucrose]     Not reaction given from HD   Gabapentin Other (See Comments)    tremors   Nsaids     Avoid due to renal problems     Social History:   Social History   Socioeconomic History   Marital status: Divorced    Spouse name: BASIL   Number of children: 3   Years of education: 1   Highest education level: GED or equivalent  Occupational History   Occupation: retired Public house manager  Tobacco Use   Smoking status: Former    Current packs/day: 0.00    Average packs/day: 0.5 packs/day for 40.0 years (20.0 ttl pk-yrs)    Types: Cigarettes    Start date: 08/1980    Quit date: 08/2020    Years since quitting: 2.3   Smokeless tobacco: Never  Vaping Use   Vaping status: Never Used  Substance and Sexual Activity   Alcohol use: Yes    Comment: occ   Drug use: No   Sexual activity: Not on file  Other Topics Concern   Not on file  Social History Narrative   Not on file   Social Determinants of Health   Financial Resource Strain: Low Risk  (06/12/2022)   Received from [facility], [facility]   Overall Financial Resource Strain (CARDIA)    Difficulty of Paying Living Expenses: Not hard at all  Recent Concern: Physicist, medical Strain - Medium  Risk (04/29/2022)   Received from [facility]   Overall Financial Resource Strain (CARDIA)    Difficulty of Paying Living Expenses: Somewhat hard  Food Insecurity: No Food Insecurity (09/20/2022)   Hunger Vital Sign    Worried About Running Out of Food in the Last Year: Never true    Ran Out of Food in the Last Year: Never true  Transportation Needs: No Transportation Needs (09/20/2022)   PRAPARE - Administrator, Civil Service (Medical): No    Lack of Transportation (Non-Medical): No  Physical Activity: Inactive (09/15/2018)   Exercise Vital Sign    Days of Exercise per Week: 0 days    Minutes of Exercise per Session: 0 min  Stress:  Stress Concern Present (07/02/2022)   Received from [facility], [facility] - Occupational Stress Questionnaire    Feeling of Stress : To some extent  Social Connections: Unknown (06/23/2021)   Received from [facility], [facility]   Social Network    Social Network: Not on file  Intimate Partner Violence: Not At Risk (11/18/2022)   Received from [facility]   HITS    Over the last 12 months how often did your partner physically hurt you?: Never    Over the last 12 months how often did your partner insult you or talk down to you?: Never    Over the last 12 months how often did your partner threaten you with physical harm?: Never    Over the last 12 months how often did your partner scream or curse at you?: Never    Family History:   Family History  Problem Relation Age of Onset   Lung cancer Mother    Diabetes Sister    Heart disease Sister    Asthma Daughter    Arthritis Daughter    Arthritis Daughter    Thyroid cancer Other        1/2 sister   Heart disease Other        1/2 sister     ROS:  Please see the history of present illness.  Difficult to get full ROS due to patient lethargy and being on BiPAP.  Physical Exam/Data:   Vitals:   01/02/23 1615 01/02/23 1630 01/02/23 1645 01/02/23 1700  BP: (!) 118/58 (!) 117/55 107/62 (!) 101/50  Pulse: 62 69 67 60  Resp: 17 18 20    Temp:      TempSrc:      SpO2: 100% 100% 100% 100%  Weight:      Height:       No intake or output data in the 24 hours ending 01/02/23 1807    01/02/2023    2:21 PM 12/29/2022    9:16 AM 12/25/2022    5:23 AM  Last 3 Weights  Weight (lbs) 132 lb 4.4 oz 131 lb 12.8 oz 128 lb 4.8 oz  Weight (kg) 60 kg 59.784 kg 58.196 kg     Body mass index is 24.99 kg/m.  General: 62 y.o. African-American female on BiPAP. HEENT: Normocephalic and atraumatic. Sclera clear.  Neck: Supple.  Heart: RRR. III/VI systolic murmur loudest at left upper sternal  border.  Lungs: On BiPAP. No significant increased work of breathing. Mild crackles noted in bilateral bases.  Abdomen: Soft, distended, and mildly tender to palpation. Bowel sounds present.  and non-tender to palpation.  Extremities: Trace lower extremity edema.    Skin: Warm and dry. Neuro: Alert and oriented x3.  No focal deficits. Psych: Normal affect. Lethargic.    EKG:  The EKG was personally reviewed and demonstrates:   Normal sinus rhythm, rate 63 bpm, with 1st degree AV block and known LBBB Telemetry:  Telemetry was personally reviewed and demonstrates:  Normal sinus rhythm with rates in the 60s.   Relevant CV Studies:  Echocardiogram 12/24/2022: Impressions: 1. 23 mm S3. Mild PVL. Vmax 4.3 m/s, MG 40 mm HG, AVA 1.38 cm2. LVOT VTI  eleavted at 38 cm (likely over estimated) with SV 119 cc. Valve reported  to be under expanded. Some of the increased gradient could be flow  related, but also there is concerns for  prosthetic valve stenosis. The aortic valve has been repaired/replaced.  Aortic valve regurgitation is mild. Procedure Date: 12/23/2022. Echo  findings are consistent with stenosis and perivalvular leak of the aortic  prosthesis.   2. Left ventricular ejection fraction, by estimation, is 65 to 70%. The  left ventricle has normal function. The left ventricle has no regional  wall motion abnormalities. There is severe concentric left ventricular  hypertrophy. Left ventricular diastolic   function could not be evaluated. There is the interventricular septum is  flattened in systole and diastole, consistent with right ventricular  pressure and volume overload.   3. Right ventricular systolic function is moderately reduced. The right  ventricular size is moderately enlarged. There is severely elevated  pulmonary artery systolic pressure. The estimated right ventricular  systolic pressure is 73.1 mmHg.   4. Left atrial size was severely dilated.   5. Right atrial size was  mildly dilated.   6. The mitral valve is degenerative. Mild mitral valve regurgitation.  Moderate mitral stenosis. The mean mitral valve gradient is 9.0 mmHg with  average heart rate of 71 bpm. Moderate to severe mitral annular  calcification.   7. The tricuspid valve is abnormal. Tricuspid valve regurgitation is  moderate to severe.   8. The inferior vena cava is dilated in size with <50% respiratory  variability, suggesting right atrial pressure of 15 mmHg.   Comparison(s): Changes from prior study are noted.    Laboratory Data:  High Sensitivity Troponin:   Recent Labs  Lab 01/02/23 1514  TROPONINIHS 252*     Chemistry Recent Labs  Lab 01/02/23 1514 01/02/23 1600  NA 133* 131*  K 4.3 7.2*  CL 101  --   CO2 24  --   GLUCOSE 92  --   BUN 50*  --   CREATININE 8.17*  --   CALCIUM 8.7*  --   GFRNONAA 5*  --   ANIONGAP 8  --     No results for input(s): "PROT", "ALBUMIN", "AST", "ALT", "ALKPHOS", "BILITOT" in the last 168 hours. Lipids No results for input(s): "CHOL", "TRIG", "HDL", "LABVLDL", "LDLCALC", "CHOLHDL" in the last 168 hours.  Hematology Recent Labs  Lab 01/02/23 1514 01/02/23 1600  WBC 6.0  --   RBC 2.65*  --   HGB 8.2* 9.5*  HCT 28.8* 28.0*  MCV 108.7*  --   MCH 30.9  --   MCHC 28.5*  --   RDW 15.9*  --   PLT 121*  --    Thyroid No results for input(s): "TSH", "FREET4" in the last 168 hours.  BNPNo results for input(s): "BNP", "PROBNP" in the last 168 hours.  DDimer No results for input(s): "DDIMER" in the last 168 hours.   Radiology/Studies:  DG Chest 2 View  Result Date: 01/02/2023 CLINICAL DATA:  Chest pain and weakness.  EXAM: CHEST - 2 VIEW COMPARISON:  12/19/2022. FINDINGS: There is moderate diffuse pulmonary vascular congestion/edema. No dense consolidation or lung collapse. Bilateral costophrenic angles are clear. No pneumothorax. Stable mildly enlarged cardio-mediastinal silhouette, which is accentuated by low lung volume. There are  surgical staples along the heart border and sternotomy wires, status post CABG (coronary artery bypass graft). Prosthetic aortic valve and presumed cardiac monitor noted overlying the left anterior chest wall. No acute osseous abnormalities. Lower cervical spinal fixation hardware seen. The soft tissues are within normal limits. IMPRESSION: *Findings favor congestive heart failure/pulmonary edema. Electronically Signed   By: Jules Schick M.D.   On: 01/02/2023 15:40     Assessment and Plan:   Elevated Troponin CAD Patient has a history of CAD s/p CABG x2 with LIMA to LAD and SVG to Diag in 2022. Most recent cath in 06/2022 prior to TAVR showed patent LIMA to LAD but occluded SVG to Diag. She presents with hypotension and vomiting.  EKG showed normal sinus rhythm with known LBBB.  Initial high-sensitivity troponin elevated at 252.  - She is currently chest pain free. She does described some atypical left sided sharp pain under her left breast that does not last long. No exertional symptoms.  - Can consider Echo but only when volume/hemodynamics improved - Will hold home Imdur and Coreg due to hypotension. - Continue aspirin.  - Intolerant to statins and Zetia in the past.  - Symptoms due not sound like angina. Troponin elevation may demand ischemia in setting of ESRD and volume overload and hypotension.  Given her history of GI bleeds and reports of melena today,no  IV Heparin for now. Hemoglobin 8.2 (down from 9.9 on 12/24/2022). If delta troponin is significantly higher, will need to reconsider this. Check hemoccult.   Chronic HFpEF Last Echo on 12/24/2022 showed LVEF of 65% with severe LVH and moderate RV dysfunction. Volume status is managed via dialysis. She has not had a full dialysis session since 12/29/2022.  - PT currently volume overloaded. Managed by dialysis   Plan for tonight   Aortic Stenosis Patient has a history of critical AS  She JUST underwent TAVR on 12/23/2022. Post  procedure echo showed mean gradient of 40 mmHg and mild PVL. Valve was felt to be under expanded, felt due to heavy annular calcification and patient prosthesis mismatch   Some of the increased gradient could also be flow related   OVerall not felt to have any therapeutic options - Consider repeat Echo after volume status improved  - Of note, she was noted to have a new LBBB after TAVR but no evidence of high-grade AV block. She was discharged on a live 2 week Zio monitor which she is still wearing.  Hypotension Patient presented with hypotension and vomiting during dialysis. Systolic BP was reportedly in the 80s at dialysis.  BP in the low 100s-110s/50s-60s. - She is on multiple antihypertensives at home. Will hold these for now.   Follow response  If does not respond, again consider echo   ESRD On hemodialysis M/W/F. Missed Wednesday's session and only completed 1/3 of session today. - Management per Nephrology. They are planning to take her to dialysis tonight.  COPD  Chronic Hypoxic Respiratory Failure on Home O2 Venous blood gas on admission showed pH 7.26, pCO2 of 60.3, pO2 111, and BiCarb 27.3. - Currently on BiPAP. - Management per primary team.  Otherwise, per primary team: - Chronic anemia - Vomiting - Type 2 diabetes mellitus - History of GI  bleeds with melena  Risk Assessment/Risk Scores:  [id number]}   TIMI Risk Score for Unstable Angina or Non-ST Elevation MI:   The patient's TIMI risk score is 5, which indicates a 26% risk of all cause mortality, new or recurrent myocardial infarction or need for urgent revascularization in the next 14 days.{   New York Heart Association (NYHA) Functional Class NYHA Class III. She also has COPD with chronic hypoxic respiratory failure.    For questions or updates, please contact [facility] Please consult [email or web address] for contact info under    Signed, Corrin Parker, PA-C  01/02/2023 6:07 PM  Patient seen and  examined   I have amended note above by Thane Edu to reflect my findngs  Pt presents from dialysis with N/V and hypotension   Session not completed   Missed session on Wednesday  Currently resting on BiPAP Neck is full Lungs relatively clear anteriorly Cardiac  RRR  II/VI systolic murmur LSB/base Abd soft Ext with tr edema  Feet warm  Impression as noted above    s/p TAVR   Mean gradient still signficiantly elevated, most likely to valve underexpansion   Follow clinically  Dialysis tonight     Will follow hemodynamics    Consider echo when volume status improves  Hold meds for now until BP improves   Dietrich Pates MD

## 2023-01-02 NOTE — Consult Note (Addendum)
[facility] Nephrology Consult Note: Reason for Consult: To manage dialysis and dialysis related needs Referring Physician: Dr. Chaney Malling  HPI:  Jodi Bell is an 62 y.o. female with past medical history significant for hypertension, DM, HLD, gout, CAD status post CABG, CHF, severe aortic stenosis status post recent TAVR on 12/23/2022, COPD, ESRD on HD MWF, presents today because of vomiting and hypotension from the dialysis.  The patient reported that she had dialysis on Monday and missed treatment on Wednesday and did not get treatment today. On arrival to the ER the blood pressure was soft, the labs showed pCO2 of 60.3, potassium 4.3, CO2 24.  Apparently within an hour the i-STAT showed potassium of 7.2, unknown if it is true.  WBC 6.0, hemoglobin 8.2.  Chest x-ray consistent with CHF/pulmonary edema.  Pending CT head and CT abdomen pelvis. It seems like patient had vomiting and diarrhea at the dialysis facility.  At baseline she uses 2 L of oxygen. Unable to obtain review of system from the patient in detail as she is wearing BiPAP.  Her son was presented with her in ER. OP HD:  [facility] MWF [address]  3.5h  58.5kg  400/600  2K/3Ca bath  Hep none  L AVF  Past Medical History:  Diagnosis Date   Anemia of chronic disease    Anxiety    Arthritis    oa and psoriatic ra   Asthma    Back pain, chronic    lower back   Chronic insomnia    COPD (chronic obstructive pulmonary disease) (HCC)    Diabetes mellitus    type 2   ESRD on dialysis [facility])    Essential hypertension    GERD (gastroesophageal reflux disease)    Gout    History of hiatal hernia    Hypoalbuminemia    Hyponatremia    Mild aortic stenosis    Mitral regurgitation    Mitral stenosis    Obesity    PAD (peripheral artery disease) (HCC)    a. externial iliac calcification seen on CT 04/2020.   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 Lynnette Caffey & Laneta Simmers.   Sleep apnea    Tobacco abuse    Upper GI bleed 05/2019    Past Surgical History:  Procedure Laterality Date   BACK SURGERY  2016   lower back fusion with cage   BIOPSY  09/23/2017   Procedure: BIOPSY;  Surgeon: Graylin Shiver, MD;  Location: [facility] ENDOSCOPY;  Service: Endoscopy;;   BIOPSY  05/19/2019   Procedure: BIOPSY;  Surgeon: Kathi Der, MD;  Location: [facility] ENDOSCOPY;  Service: Gastroenterology;;   CESAREAN SECTION  1990   x 1    COLONOSCOPY WITH PROPOFOL N/A 09/23/2017   Procedure: COLONOSCOPY WITH PROPOFOL Hemostatic clips placed;  Surgeon: Graylin Shiver, MD;  Location: [facility] ENDOSCOPY;  Service: Endoscopy;  Laterality: N/A;   COLONOSCOPY WITH PROPOFOL N/A 10/02/2020   Procedure: COLONOSCOPY WITH PROPOFOL;  Surgeon: Willis Modena, MD;  [facility] END[facility];  Service: Endoscopy;  Laterality: N/A;   CORONARY ARTERY BYPASS GRAFT N/A 09/13/2020   Procedure: CORONARY ARTERY BYPASS GRAFTING (CABG), ON PUMP, TIMES TWO, USING LEFT INTERNAL MAMMARY ARTERY AND RIGHT ENDOSCOPICALLY HARVESTED GREATER SAPHENOUS VEIN;  Surgeon: Alleen Borne, MD;  Location: [facility] OR;  Service: Open Heart Surgery;  Laterality: N/A;   DILATION AND CURETTAGE OF UTERUS  1988  ENTEROSCOPY N/A 07/22/2021   Procedure: ENTEROSCOPY;  Surgeon: Kerin Salen, MD;  Location: [facility] ENDOSCOPY;  Service: Gastroenterology;  Laterality: N/A;   ESOPHAGOGASTRODUODENOSCOPY N/A 09/22/2020   Procedure: ESOPHAGOGASTRODUODENOSCOPY (EGD);  Surgeon: Willis Modena, MD;  Location: [facility] ENDOSCOPY;  Service: Endoscopy;  Laterality: N/A;   ESOPHAGOGASTRODUODENOSCOPY (EGD) WITH PROPOFOL N/A 09/23/2017   Procedure: ESOPHAGOGASTRODUODENOSCOPY (EGD) WITH PROPOFOL;  Surgeon: Graylin Shiver, MD;  [facility]: [facility] ENDOSCOPY;  Service: Endoscopy;  Laterality: N/A;   ESOPHAGOGASTRODUODENOSCOPY (EGD) WITH PROPOFOL N/A 05/19/2019   Procedure: ESOPHAGOGASTRODUODENOSCOPY (EGD) WITH PROPOFOL;  Surgeon: Kathi Der, MD;   [facility]: [facility] ENDOS[facility];  Service: Gastroenterology;  Laterality: N/A;   ESOPHAGOGASTRODUODENOSCOPY (EGD) WITH PROPOFOL N/A 10/02/2020   Procedure: ESOPHAGOGASTRODUODENOSCOPY (EGD) WITH PROPOFOL;  Surgeon: Willis Modena, MD;  Location: [facility] ENDOSCOPY;  Service: Endoscopy;  Laterality: N/A;   GIVENS CAPSULE STUDY N/A 05/19/2019   Procedure: GIVENS CAPSULE STUDY;  Surgeon: Kathi Der, MD;  [facility]: [facility] ENDOSCOPY;  Service: Gastroenterology;  Laterality: N/A;   GIVENS CAPSULE STUDY N/A 07/19/2021   Procedure: GIVENS CAPSULE STUDY;  Surgeon: [name], MD;  Location: [facility] ENDOSCOPY;  Service: Gastroenterology;  Laterality: N/A;   HEMOSTASIS CLIP PLACEMENT  09/22/2020   Procedure: HEMOSTASIS CLIP PLACEMENT;  Surgeon: Willis Modena, MD;  [facility] ENDOS[facility];  Service: Endoscopy;;   HEMOSTASIS CONTROL  09/22/2020   Procedure: HEMOSTASIS CONTROL;  Surgeon: Willis Modena, MD;  Location: [facility] ENDOSCOPY;  Service: Endoscopy;;   HOT HEMOSTASIS N/A 05/19/2019   Procedure: HOT HEMOSTASIS (ARGON PLASMA COAGULATION/BICAP);  Surgeon: Kathi Der, MD;  Location: [facility] ENDOSCOPY;  Service: Gastroenterology;  Laterality: N/A;   INTRAOPERATIVE TRANSTHORACIC ECHOCARDIOGRAM N/A 12/23/2022   Procedure: INTRAOPERATIVE TRANSTHORACIC ECHOCARDIOGRAM;  Surgeon: [name], MD;  [facility]: [facility] INVASIVE CV LAB;  Service: Cardiovascular;  Laterality: N/A;   LEFT HEART CATH AND CORONARY ANGIOGRAPHY N/A 09/10/2020   Procedure: LEFT HEART CATH AND CORONARY ANGIOGRAPHY;  Surgeon: Yvonne Kendall, MD;  [facility] INVASIVE CV LAB;  Service: Cardiovascular;  Laterality: N/A;   PARTIAL KNEE ARTHROPLASTY Left 11/12/2017   Procedure: left unicompartmental arthroplasty-medial;  Surgeon: Durene Romans, MD;  Location: [facility] ORS;  Service: Orthopedics;  Laterality: Left;    RIGHT HEART CATH AND CORONARY/GRAFT ANGIOGRAPHY N/A 06/26/2022   Procedure: RIGHT HEART CATH AND CORONARY/GRAFT ANGIOGRAPHY;  Surgeon: [name], MD;   [facility] INVASIVE CV LAB;  Service: Cardiovascular;  Laterality: N/A;   SUBMUCOSAL INJECTION  09/23/2017   Procedure: SUBMUCOSAL INJECTION;  Surgeon: Graylin Shiver, MD;  Location: [facility] ENDOSCOPY;  Service: Endoscopy;;  in colon   TEE WITHOUT CARDIOVERSION N/A 09/13/2020   Procedure: TRANSESOPHAGEAL ECHOCARDIOGRAM (TEE);  Surgeon: Alleen Borne, MD;  Location: [facility] OR;  Service: Open Heart Surgery;  Laterality: N/A;   TUBAL LIGATION  1990    Family History  Problem Relation Age of Onset   Lung cancer Mother    Diabetes Sister    Heart disease Sister    Asthma Daughter    Arthritis Daughter    Arthritis Daughter    Thyroid cancer Other        1/2 sister   Heart disease Other        1/2 sister    Social History:  reports that she quit smoking about 2 years ago. Her smoking use included cigarettes. She started smoking about 42 years ago. She has a 20 pack-year smoking history. She has never used smokeless tobacco. She reports current alcohol use. She reports that she does not use drugs.  Allergies:  Allergies  Allergen Reactions   3-Methyl-2-Benzothiazolinone Hydrazone Other (See Comments)    Muscle weakness muscle cramping in legs Other reaction(s): Myalgias (Muscle Pain)   Banana Anaphylaxis, Swelling and Other (See Comments)    TONGUE AND MOUTH SWELLING   Black Walnut Flavor Anaphylaxis and Itching    Walnuts   Hazelnut (Filbert) Anaphylaxis    Hazelnuts   Leflunomide And Related Other (See Comments)    Severe Headache   Lisinopril Swelling    Angioedema  Swelling of the face   No Healthtouch Food Allergies Anaphylaxis    Spicy Mustard 4.7.2021 Pt reports that she eats Regular Yellow Mustard Swelling and itching of tongue   Other Anaphylaxis, Swelling and Other (See Comments)    Cosentyx= angioedema Mouth itches and tongue swelling Hazel nuts and pecans also Walnuts Renal failure Serotonin syndrome  Tremors Muscle weakness muscle cramping in legs Other  reaction(s): Myalgias (Muscle Pain)   Pecan Nut (Diagnostic) Anaphylaxis and Itching    Pecans   Trazodone And Nefazodone Other (See Comments)    Serotonin Syndrome  Tremors   Adalimumab Other (See Comments)    Blisters on abdomen =humira   Escitalopram Oxalate Other (See Comments)    Hand problems - Serotonin Syndrome     Ezetimibe Other (See Comments)    myalgias cramps    Secukinumab Swelling    Cosentyx = angiodema    Statins Other (See Comments)    Leg pains and weakness   Ferrlecit [Na Ferric Gluc Cplx In Sucrose]     Not reaction given from HD   Gabapentin Other (See Comments)    tremors   Nsaids     Avoid due to renal problems     Medications: I have reviewed the patient's current medications.   Results for orders placed or performed during the hospital encounter of 01/02/23 (from the past 48 hour(s))  CBG monitoring, ED     Status: Abnormal   Collection Time: 01/02/23  2:10 PM  Result Value Ref Range   Glucose-Capillary 103 (H) 70 - 99 mg/dL    Comment: Glucose reference range applies only to samples taken after fasting for at least 8 hours.  Basic metabolic panel     Status: Abnormal   Collection Time: 01/02/23  3:14 PM  Result Value Ref Range   Sodium 133 (L) 135 - 145 mmol/L   Potassium 4.3 3.5 - 5.1 mmol/L   Chloride 101 98 - 111 mmol/L   CO2 24 22 - 32 mmol/L   Glucose, Bld 92 70 - 99 mg/dL    Comment: Glucose reference range applies only to samples taken after fasting for at least 8 hours.   BUN 50 (H) 8 - 23 mg/dL   Creatinine, Ser 1.19 (H) 0.44 - 1.00 mg/dL   Calcium 8.7 (L) 8.9 - 10.3 mg/dL   GFR, Estimated 5 (L) >60 mL/min    Comment: (NOTE) Calculated using the CKD-EPI Creatinine Equation (2021)    Anion gap 8 5 - 15    Comment: Performed at [facility], [address] [place], [place] [address]  CBC     Status: Abnormal   Collection Time: 01/02/23  3:14 PM  Result Value Ref Range   WBC 6.0 4.0 - 10.5 K/uL   RBC 2.65 (L) 3.87 -  5.11 MIL/uL   Hemoglobin 8.2 (L) 12.0 - 15.0 g/dL   HCT 95.6 (L) 21.3 - 08.6 %   MCV 108.7 (H) 80.0 - 100.0 fL   MCH 30.9 26.0 -  34.0 pg   MCHC 28.5 (L) 30.0 - 36.0 g/dL   RDW 13.0 (H) 86.5 - 78.4 %   Platelets 121 (L) 150 - 400 K/uL    Comment: REPEATED TO VERIFY   nRBC 0.0 0.0 - 0.2 %    Comment: Performed at [facility], [address] [place], [place] [address]  Troponin I (High Sensitivity)     Status: Abnormal   Collection Time: 01/02/23  3:14 PM  Result Value Ref Range   Troponin I (High Sensitivity) 252 (HH) <18 ng/L    Comment: CRITICAL RESULT CALLED TO, READ BACK BY AND VERIFIED WITH Carmie End RN @1616  01/02/23 E,[place] (NOTE) Elevated high sensitivity troponin I (hsTnI) values and significant  changes across serial measurements may suggest ACS but many other  chronic and acute conditions are known to elevate hsTnI results.  Refer to the "Links" section for chest pain algorithms and additional  guidance. Performed at [facility], [address] [place], [place] [address]   I-Stat venous blood gas, [facility], [facility], [facility])     Status: Abnormal   Collection Time: 01/02/23  4:00 PM  Result Value Ref Range   pH, Ven 7.264 7.25 - 7.43   pCO2, Ven 60.3 (H) 44 - 60 mmHg   pO2, Ven 111 (H) 32 - 45 mmHg   Bicarbonate 27.3 20.0 - 28.0 mmol/L   TCO2 29 22 - 32 mmol/L   O2 Saturation 97 %   Acid-Base Excess 0.0 0.0 - 2.0 mmol/L   Sodium 131 (L) 135 - 145 mmol/L   Potassium 7.2 (HH) 3.5 - 5.1 mmol/L   Calcium, Ion 1.15 1.15 - 1.40 mmol/L   HCT 28.0 (L) 36.0 - 46.0 %   Hemoglobin 9.5 (L) 12.0 - 15.0 g/dL   Sample type VENOUS    Comment NOTIFIED PHYSICIAN     DG Chest 2 View  Result Date: 01/02/2023 CLINICAL DATA:  Chest pain and weakness. EXAM: CHEST - 2 VIEW COMPARISON:  12/19/2022. FINDINGS: There is moderate diffuse pulmonary vascular congestion/edema. No dense consolidation or lung collapse. Bilateral costophrenic angles are clear. No pneumothorax. Stable  mildly enlarged cardio-mediastinal silhouette, which is accentuated by low lung volume. There are surgical staples along the heart border and sternotomy wires, status post CABG (coronary artery bypass graft). Prosthetic aortic valve and presumed cardiac monitor noted overlying the left anterior chest wall. No acute osseous abnormalities. Lower cervical spinal fixation hardware seen. The soft tissues are within normal limits. IMPRESSION: *Findings favor congestive heart failure/pulmonary edema. Electronically Signed   By: Jules Schick M.D.   On: 01/02/2023 15:40    ROS: As per H&P, rest of the systems reviewed and negative. Blood pressure (!) 107/53, pulse 65, temperature 97.7 F (36.5 C), temperature source Oral, resp. rate 16, height 5\' 1"  (1.549 m), weight 60 kg, SpO2 96%. Gen: NAD, comfortable, on BiPAP. Respiratory: Clear bilateral, no wheezing or crackle Cardiovascular: Regular rate rhythm S1-S2 normal, no rubs GI: Abdomen soft, nontender, nondistended Extremities, no cyanosis or clubbing, no edema Skin: No rash or ulcer Neurology: Alert, awake, following commands, oriented Dialysis Access: AV fistula has good thrill and bruit.  Assessment/Plan:  # Acute on chronic hypoxic/hypercapnic respiratory failure with pulmonary edema on x-ray.  Currently on BiPAP.  Will try to do HD tonight.  Hopefully we can get some UF as her blood pressure is soft.  # ESRD: MWF, missed dialysis.  We will plan to do HD today.  Use AV fistula for  the access.  The initial potassium was normal but repeat one within an hour was elevated, unsure if it is true.  I will repeat BMP.  Plan for HD tonight anyway.  Discussed with the dialysis nurse.  # Hypertension/volume: She is supposed to be on multiple antihypertensives.  BP is soft likely due to diarrhea and vomiting.  Getting CT scan of abdomen pelvis.  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  # CKD-metabolic Bone Disease: Monitor calcium/phosphorus level. Thank  you for the consult, we will continue to follow.  Zenas Santa Jaynie Collins 01/02/2023, 4:47 PM

## 2023-01-02 NOTE — H&P (Incomplete)
Date: 01/02/2023               Patient Name:  Jodi Bell MRN: [id number]  DOB: [birth year] Age / Sex: 62 y.o., female   PCP: Malka So., MD         Medical Service: Internal Medicine Teaching Service         Attending Physician: Dr. Ginnie Smart, MD      First Contact: Monna Fam, MD  Pager: [phone]    Second Contact: Rocky Morel, DO  Pager: [phone]         After Hours (After 5p/  First Contact Pager: [phone]  weekends / holidays): Second Contact Pager: [phone]   SUBJECTIVE   Chief Complaint: Vomiting   History of Present Illness: Jodi Bell is a 62 YO F with a PMH of diabetes, ESRD on MWF HD, COPD on 3 of O2, aortic stenosis s/p TAVR (12/23/22), CAD s/p CABG who presented to the ED with vomiting and hypotension. The patient states that over the past w, she has become progressively more dependent on her ADLs and iADLs. She started feeling unwell on Sunday in which she said she had bilateral eye pain that improved after taking eye drops. She was at her baseline until again waking up on Wednesday with similar eye pain that resolved after using eye drops. The eye pain caused her to miss dialysis on Wednesday. Today, she woke up feeling more fatigued and nauseous. She vomited two times at home that produced a clear substance. At dialysis, it was noted that she continued to feel unwell but became hypotensive with a systolic in the 80s, so she came to the ED. She was only able to complete ~1/3 of the treatment. At dialysis, she also endorsed an acute urge to defecate but was unable to make it to the restroom. She denies any chest pain, shortness of breath, fevers, or abdominal pain.   ED Course:  Meds:  No outpatient medications have been marked as taking for the 01/02/23 encounter [facility] Encounter).    Past Medical History  Past Surgical History:  Procedure Laterality Date  . BACK SURGERY  2016   lower back fusion with cage  . BIOPSY  09/23/2017    Procedure: BIOPSY;  Surgeon: Graylin Shiver, MD;  Location: [facility] ENDOSCOPY;  Service: Endoscopy;;  . BIOPSY  05/19/2019   Procedure: BIOPSY;  Surgeon: Kathi Der, MD;  Location: [facility] ENDOSCOPY;  Service: Gastroenterology;;  . CESAREAN SECTION  1990   x 1   . COLONOSCOPY WITH PROPOFOL N/A 09/23/2017   Procedure: COLONOSCOPY WITH PROPOFOL Hemostatic clips placed;  Surgeon: Graylin Shiver, MD;  Location: [facility] ENDOSCOPY;  Service: Endoscopy;  Laterality: N/A;  . COLONOSCOPY WITH PROPOFOL N/A 10/02/2020   Procedure: COLONOSCOPY WITH PROPOFOL;  Surgeon: Willis Modena, MD;  Location: [facility] ENDOSCOPY;  Service: Endoscopy;  Laterality: N/A;  . CORONARY ARTERY BYPASS GRAFT N/A 09/13/2020   Procedure: CORONARY ARTERY BYPASS GRAFTING (CABG), ON PUMP, TIMES TWO, USING LEFT INTERNAL MAMMARY ARTERY AND RIGHT ENDOSCOPICALLY HARVESTED GREATER SAPHENOUS VEIN;  Surgeon: Alleen Borne, MD;  Location: [facility] OR;  Service: Open Heart Surgery;  Laterality: N/A;  . DILATION AND CURETTAGE OF UTERUS  1988  . ENTEROSCOPY N/A 07/22/2021   Procedure: ENTEROSCOPY;  Surgeon: Kerin Salen, MD;  Location: [facility] ENDOSCOPY;  Service: Gastroenterology;  Laterality: N/A;  . ESOPHAGOGASTRODUODENOSCOPY N/A 09/22/2020   Procedure: ESOPHAGOGASTRODUODENOSCOPY (EGD);  Surgeon: Willis Modena, MD;  Location: [facility] ENDOSCOPY;  Service: Endoscopy;  Laterality: N/A;  .  ESOPHAGOGASTRODUODENOSCOPY (EGD) WITH PROPOFOL N/A 09/23/2017   Procedure: ESOPHAGOGASTRODUODENOSCOPY (EGD) WITH PROPOFOL;  Surgeon: Graylin Shiver, MD;  Location: [facility] ENDOSCOPY;  Service: Endoscopy;  Laterality: N/A;  . ESOPHAGOGASTRODUODENOSCOPY (EGD) WITH PROPOFOL N/A 05/19/2019   Procedure: ESOPHAGOGASTRODUODENOSCOPY (EGD) WITH PROPOFOL;  Surgeon: Kathi Der, MD;  Location: [facility] ENDOSCOPY;  Service: Gastroenterology;  Laterality: N/A;  . ESOPHAGOGASTRODUODENOSCOPY (EGD) WITH PROPOFOL N/A 10/02/2020   Procedure: ESOPHAGOGASTRODUODENOSCOPY (EGD) WITH PROPOFOL;  Surgeon: Willis Modena, MD;   Location: [facility] ENDOSCOPY;  Service: Endoscopy;  Laterality: N/A;  . GIVENS CAPSULE STUDY N/A 05/19/2019   Procedure: GIVENS CAPSULE STUDY;  Surgeon: Kathi Der, MD;  Location: [facility] ENDOSCOPY;  Service: Gastroenterology;  Laterality: N/A;  . GIVENS CAPSULE STUDY N/A 07/19/2021   Procedure: GIVENS CAPSULE STUDY;  Surgeon: [name], MD;  Location: [facility] ENDOSCOPY;  Service: Gastroenterology;  Laterality: N/A;  . HEMOSTASIS CLIP PLACEMENT  09/22/2020   Procedure: HEMOSTASIS CLIP PLACEMENT;  Surgeon: Willis Modena, MD;  Location: [facility] ENDOSCOPY;  Service: Endoscopy;;  . HEMOSTASIS CONTROL  09/22/2020   Procedure: HEMOSTASIS CONTROL;  Surgeon: Willis Modena, MD;  Location: [facility] ENDOSCOPY;  Service: Endoscopy;;  . HOT HEMOSTASIS N/A 05/19/2019   Procedure: HOT HEMOSTASIS (ARGON PLASMA COAGULATION/BICAP);  Surgeon: Kathi Der, MD;  Location: [facility] ENDOSCOPY;  Service: Gastroenterology;  Laterality: N/A;  . INTRAOPERATIVE TRANSTHORACIC ECHOCARDIOGRAM N/A 12/23/2022   Procedure: INTRAOPERATIVE TRANSTHORACIC ECHOCARDIOGRAM;  Surgeon: [name], MD;  Location: [facility] INVASIVE CV LAB;  Service: Cardiovascular;  Laterality: N/A;  . LEFT HEART CATH AND CORONARY ANGIOGRAPHY N/A 09/10/2020   Procedure: LEFT HEART CATH AND CORONARY ANGIOGRAPHY;  Surgeon: Yvonne Kendall, MD;  Location: [facility] INVASIVE CV LAB;  Service: Cardiovascular;  Laterality: N/A;  . PARTIAL KNEE ARTHROPLASTY Left 11/12/2017   Procedure: left unicompartmental arthroplasty-medial;  Surgeon: Durene Romans, MD;  Location: [facility] ORS;  Service: Orthopedics;  Laterality: Left;   . RIGHT HEART CATH AND CORONARY/GRAFT ANGIOGRAPHY N/A 06/26/2022   Procedure: RIGHT HEART CATH AND CORONARY/GRAFT ANGIOGRAPHY;  Surgeon: [name], MD;  Location: [facility] INVASIVE CV LAB;  Service: Cardiovascular;  Laterality: N/A;  . SUBMUCOSAL INJECTION  09/23/2017   Procedure: SUBMUCOSAL INJECTION;  Surgeon: Graylin Shiver, MD;  Location: [facility] ENDOSCOPY;  Service:  Endoscopy;;  in colon  . TEE WITHOUT CARDIOVERSION N/A 09/13/2020   Procedure: TRANSESOPHAGEAL ECHOCARDIOGRAM (TEE);  Surgeon: Alleen Borne, MD;  Location: [facility] OR;  Service: Open Heart Surgery;  Laterality: N/A;  . TUBAL LIGATION  1990    Social:  Lives With: Occupation: Support: Level of Function:  - ADLs =  - iADLS =  PCP: Substances:  Family History: ***  Allergies: Allergies as of 01/02/2023 - Reviewed 01/02/2023  Allergen Reaction Noted  . 3-methyl-2-benzothiazolinone hydrazone Other (See Comments) 09/07/2012  . Banana Anaphylaxis, Swelling, and Other (See Comments) 01/21/2018  . Black walnut flavor Anaphylaxis and Itching 05/18/2019  . Hazelnut (filbert) Anaphylaxis 05/18/2019  . Leflunomide and related Other (See Comments) 07/23/2012  . Lisinopril Swelling 01/05/2014  . No healthtouch food allergies Anaphylaxis 05/18/2019  . Other Anaphylaxis, Swelling, and Other (See Comments) 09/07/2012  . Pecan nut (diagnostic) Anaphylaxis and Itching 05/18/2019  . Trazodone and nefazodone Other (See Comments) 07/23/2014  . Adalimumab Other (See Comments) 07/23/2012  . Escitalopram oxalate Other (See Comments) 09/29/2013  . Ezetimibe Other (See Comments) 01/13/2018  . Secukinumab Swelling 05/18/2019  . Statins Other (See Comments) 09/04/2014  . Ferrlecit [na ferric gluc cplx in sucrose]  09/19/2020  . Gabapentin Other (See Comments) 06/26/2021  . Nsaids  06/24/2022    Review of Systems: A complete ROS was negative except as per HPI.   OBJECTIVE:   Physical Exam: Blood pressure (!) 120/56, pulse 63, temperature (!) 96.5 F (35.8 C), temperature source Axillary, resp. rate 16, height 5\' 1"  (1.549 m), weight 60 kg, SpO2 100%.  Constitutional: well-appearing *** sitting in ***, in no acute distress HENT: normocephalic atraumatic, mucous membranes moist Eyes: conjunctiva non-erythematous Neck: supple Cardiovascular: regular rate and rhythm, no m/r/g Pulmonary/Chest: normal  work of breathing on room air, lungs clear to auscultation bilaterally Abdominal: soft, non-tender, non-distended MSK: normal bulk and tone Neurological: alert & oriented x 3, 5/5 strength in bilateral upper and lower extremities, normal gait Skin: warm and dry Psych: ***  Labs: CBC    Component Value Date/Time   WBC 6.0 01/02/2023 1514   RBC 2.65 (L) 01/02/2023 1514   HGB 8.8 (L) 01/02/2023 2045   HCT 26.0 (L) 01/02/2023 2045   PLT 121 (L) 01/02/2023 1514   MCV 108.7 (H) 01/02/2023 1514   MCV 94.5 10/12/2012 1211   MCH 30.9 01/02/2023 1514   MCHC 28.5 (L) 01/02/2023 1514   RDW 15.9 (H) 01/02/2023 1514   LYMPHSABS 0.6 (L) 09/20/2022 0906   MONOABS 0.6 09/20/2022 0906   EOSABS 0.6 (H) 09/20/2022 0906   BASOSABS 0.0 09/20/2022 0906     CMP     Component Value Date/Time   NA 132 (L) 01/02/2023 2045   K 5.6 (H) 01/02/2023 2101   CL 100 01/02/2023 1811   CO2 22 01/02/2023 1811   GLUCOSE 90 01/02/2023 1811   BUN 51 (H) 01/02/2023 1811   CREATININE 8.19 (H) 01/02/2023 1811   CREATININE 1.34 (H) 09/18/2011 1400   CALCIUM 8.3 (L) 01/02/2023 1811   PROT 5.0 (L) 01/02/2023 1811   ALBUMIN 2.4 (L) 01/02/2023 1811   ALBUMIN 2.4 (L) 01/02/2023 1811   AST 22 01/02/2023 1811   ALT 11 01/02/2023 1811   ALKPHOS 48 01/02/2023 1811   BILITOT 1.2 (H) 01/02/2023 1811   GFRNONAA 5 (L) 01/02/2023 1811   GFRAA 16 (L) 11/09/2019 1152    Imaging:  EKG: personally reviewed my interpretation is***. Prior EKG***  ASSESSMENT & PLAN:   Assessment & Plan by Problem: Principal Problem:   GI bleed Active Problems:   Acute hypercapnic respiratory failure (HCC)   GERD (gastroesophageal reflux disease)   COPD (chronic obstructive pulmonary disease) (HCC)   Acute anemia   ESRD (end stage renal disease) (HCC)   OSA (obstructive sleep apnea)   S/P TAVR (transcatheter aortic valve replacement)   Jodi Bell is a 62 y.o. person living with a history of *** who presented with *** and  admitted for *** on hospital day 0  *** ***  *** ***  *** ***  Diet: {NAMES:3044014::"Normal","Heart Healthy","Carb-Modified","Renal","Carb/Renal","NPO","TPN","Tube Feeds"} VTE: {NAMES:3044014::"Heparin","Enoxaparin","SCDs","DOAC","None"} IVF: {NAMES:3044014::"None","NS","1/2 NS","LR","D5","D10"},{NAMES:3044014::"None","10cc/hr","25cc/hr","50cc/hr","75cc/hr","100cc/hr","110cc/hr","125cc/hr","Bolus"} Code: {NAMES:3044014::"Full","DNR","DNI","DNR/DNI","Comfort Care","Unknown"}  Prior to Admission Living Arrangement: {NAMES:3044014::"Home, living ***","SNF, ***","Homeless","***"} Anticipated Discharge Location: {NAMES:3044014::"Home","SNF","CIR","***"} Barriers to Discharge: ***  Dispo: Admit patient to {STATUS:3044014::"Observation with expected length of stay less than 2 midnights.","Inpatient with expected length of stay greater than 2 midnights."}  Signed: Morrie Sheldon, MD Internal Medicine Resident PGY-1  01/02/2023, 11:53 PM   {Intern/Pager:[id number]}   Dialysis, elevated tropes, bloody stool ESRD M, W, F HD Prior CABG Hypotension + vomiting (non bloody, non billious) Initially hypotensive with SBP in 80s,  Cards and nephro consulted (dialyzed tonight) Dark bloody bowel movement CC: vomiting from patient, hypotensive from EMS

## 2023-01-02 NOTE — ED Provider Notes (Signed)
[place] EMERGENCY DEPARTMENT AT [facility] Provider Note   CSN: [id number] Arrival date & time: 01/02/23  1408     History  Chief Complaint  Patient presents with   Weakness    Jodi Bell is a 62 y.o. female with a PMH of diabetes, ESRD on MWF HD, COPD on 2 L O2, aortic stenosis s/p TAVR, CAD s/p CABG, who presented to the ED for vomiting and hypotension from dialysis.  Per patient, she has had multiple episodes of nonbloody, nonbilious emesis.  She denies diarrhea or constipation and states her last bowel movement was today.  Denies blood in her stool.  Reports she makes a very small amount of urine.  Denies abdominal pain, fevers, shortness of breath, or chest pain.  Patient reports she received her full session of dialysis, however per EMS she only received about 1/3 of the treatment.   Weakness      Home Medications Prior to Admission medications   Medication Sig Start Date End Date Taking? Authorizing Provider  acetaminophen (TYLENOL) 500 MG tablet Take 1,000 mg by mouth daily as needed (pain).    [provider]  albuterol (PROVENTIL) (2.5 MG/3ML) 0.083% nebulizer solution Take 3 mLs (2.5 mg total) by nebulization every 6 (six) hours as needed for wheezing or shortness of breath. 07/10/21   Hunsucker, Lesia Sago, MD  allopurinol (ZYLOPRIM) 100 MG tablet Take 100 mg by mouth every evening.    [provider]  amoxicillin (AMOXIL) 500 MG tablet Take 4 tablets by mouth 1 hour prior to dental procedures and cleanings. 12/29/22   Georgie Chard D, NP  Artificial Tear Solution (TEARS NATURALE OP) Place 1 drop into both eyes as needed (dry eye).    [provider]  aspirin 81 MG chewable tablet Chew 1 tablet (81 mg total) by mouth daily. 12/25/22   Janetta Hora, PA-C  busPIRone (BUSPAR) 5 MG tablet Take 5 mg by mouth 3 (three) times daily. 01/01/23 01/01/24  [provider]  carboxymethylcellulose 1 % ophthalmic solution  Place 1 drop into both eyes 3 (three) times daily.    [provider]  carvedilol (COREG) 25 MG tablet Take 1 tablet (25 mg total) by mouth 2 (two) times daily with a meal. 12/25/22   Janetta Hora, PA-C  cetirizine (ZYRTEC) 10 MG tablet Take 10 mg by mouth daily as needed for allergies.    [provider]  Cholecalciferol (VITAMIN D) 50 MCG (2000 UT) tablet Take 2,000 Units by mouth daily.    [provider]  cinacalcet (SENSIPAR) 30 MG tablet Take 60 mg by mouth every Monday, Wednesday, and Friday with hemodialysis.    [provider]  cloNIDine (CATAPRES - DOSED IN MG/24 HR) 0.3 mg/24hr patch Place 0.3 mg onto the skin every Sunday.    [provider]  diclofenac Sodium (VOLTAREN) 1 % GEL Apply 2 g topically daily as needed (pain).    [provider]  docusate sodium (COLACE) 100 MG capsule Take 100 mg by mouth daily.    [provider]  EPINEPHrine 0.3 mg/0.3 mL IJ SOAJ injection Inject into the muscle as needed for anaphylaxis. 07/12/21   [provider]  fluticasone (FLONASE) 50 MCG/ACT nasal spray Place 1 spray into both nostrils daily as needed for allergies or rhinitis.    [provider]  hydrALAZINE (APRESOLINE) 100 MG tablet Take 1 tablet (100 mg total) by mouth every 8 (eight) hours. 07/06/20   Cresenzo, Cyndi Lennert,  MD  hydrocortisone (ANUSOL-HC) 2.5 % rectal cream Place rectally 2 (two) times daily. 07/23/21   Park Pope, MD  isosorbide mononitrate (IMDUR) 60 MG 24 hr tablet Take 1 tablet (60 mg total) by mouth daily. 10/27/22   Jake Bathe, MD  LORazepam (ATIVAN) 0.5 MG tablet Take 0.5 mg by mouth every 8 (eight) hours as needed for anxiety.    [provider]  montelukast (SINGULAIR) 10 MG tablet Take 10 mg by mouth daily with supper.     [provider]  multivitamin (RENA-VIT) TABS tablet Take 1 tablet by mouth every Monday, Wednesday, and Friday.    [provider]   neomycin-polymyxin b-dexamethasone (MAXITROL) 3.5-10000-0.1 SUSP Place 1 drop into both eyes in the morning, at noon, in the evening, and at bedtime. 12/18/22   [provider]  NIFEdipine (PROCARDIA XL/NIFEDICAL XL) 60 MG 24 hr tablet Take 60 mg by mouth 2 (two) times daily. 10/01/21   [provider]  olmesartan (BENICAR) 40 MG tablet Take 40 mg by mouth every evening. 05/06/19   [provider]  ondansetron (ZOFRAN) 4 MG tablet Take 4 mg by mouth 2 (two) times daily as needed for nausea or vomiting.    [provider]  OXYGEN Inhale 2-3 L into the lungs continuous.    [provider]  pantoprazole (PROTONIX) 40 MG tablet Take 40 mg by mouth 2 (two) times daily. 06/09/20   [provider]  predniSONE (DELTASONE) 5 MG tablet Take 1 tablet (5 mg total) by mouth daily with breakfast. 02/27/22   Marrianne Mood, MD  Probiotic Product (PROBIOTIC PO) Take 1 capsule by mouth daily.    [provider]  silver sulfADIAZINE (SILVADENE) 1 % cream Apply 1 Application topically daily as needed (wound care).    [provider]  Tiotropium Bromide-Olodaterol (STIOLTO RESPIMAT) 2.5-2.5 MCG/ACT AERS Inhale 2 puffs once daily into the lungs. 11/28/21   Hunsucker, Lesia Sago, MD  VENTOLIN HFA 108 (90 Base) MCG/ACT inhaler Inhale 2 puffs into the lungs See admin instructions. Take 2 puffs every 4-6 hours as needed for wheezing and shortness of breath 04/08/22   Hunsucker, Lesia Sago, MD  White Petrolatum-Mineral Oil (SOOTHE NIGHTTIME) OINT Place 1 Application into both eyes at bedtime.    [provider]  zolpidem (AMBIEN) 5 MG tablet Take 5 mg by mouth at bedtime as needed for sleep. 02/04/21   [provider]      Allergies    3-methyl-2-benzothiazolinone hydrazone, Banana, Black walnut flavor, Hazelnut (filbert), Leflunomide and related, Lisinopril, No healthtouch food allergies, Other, Pecan nut (diagnostic), Trazodone and  nefazodone, Adalimumab, Escitalopram oxalate, Ezetimibe, Secukinumab, Statins, Ferrlecit [na ferric gluc cplx in sucrose], Gabapentin, and Nsaids    Review of Systems   Review of Systems  Neurological:  Positive for weakness.    Physical Exam Updated Vital Signs BP (!) 136/54   Pulse 62   Temp (!) 96.5 F (35.8 C) (Axillary)   Resp 20   Ht 5\' 1"  (1.549 m)   Wt 60 kg   SpO2 100%   BMI 24.99 kg/m  Physical Exam Constitutional:      General: She is not in acute distress.    Appearance: Normal appearance. She is ill-appearing.  HENT:     Head: Normocephalic and atraumatic.     Nose: Nose normal.     Mouth/Throat:     Mouth: Mucous membranes are dry.     Pharynx: Oropharynx is clear.  Eyes:  Pupils: Pupils are equal, round, and reactive to light.  Cardiovascular:     Rate and Rhythm: Normal rate and regular rhythm.     Heart sounds: Normal heart sounds. No murmur heard.    No friction rub. No gallop.  Pulmonary:     Effort: Pulmonary effort is normal.     Breath sounds: Normal breath sounds. No stridor. No wheezing, rhonchi or rales.     Comments: Diminished in all fields likely due to habitus Abdominal:     Palpations: Abdomen is soft.     Tenderness: There is no abdominal tenderness. There is no guarding or rebound.  Musculoskeletal:     Cervical back: Normal range of motion and neck supple.     Right lower leg: Edema present.     Left lower leg: Edema present.     Comments: Trace bilateral lower extremity pitting edema  Skin:    General: Skin is warm and dry.  Neurological:     General: No focal deficit present.     Mental Status: She is alert.     Comments: Cranial nerves II-XII intact.  Sensation intact in all 4 extremities, strength 4/5 in all 4 extremities, symmetric without focal deficits     ED Results / Procedures / Treatments   Labs (all labs ordered are listed, but only abnormal results are displayed) Labs Reviewed  BASIC METABOLIC PANEL -  Abnormal; Notable for the following components:      Result Value   Sodium 133 (*)    BUN 50 (*)    Creatinine, Ser 8.17 (*)    Calcium 8.7 (*)    GFR, Estimated 5 (*)    All other components within normal limits  CBC - Abnormal; Notable for the following components:   RBC 2.65 (*)    Hemoglobin 8.2 (*)    HCT 28.8 (*)    MCV 108.7 (*)    MCHC 28.5 (*)    RDW 15.9 (*)    Platelets 121 (*)    All other components within normal limits  AMMONIA - Abnormal; Notable for the following components:   Ammonia 46 (*)    All other components within normal limits  RENAL FUNCTION PANEL - Abnormal; Notable for the following components:   Sodium 131 (*)    Potassium 6.2 (*)    BUN 51 (*)    Creatinine, Ser 8.19 (*)    Calcium 8.3 (*)    Albumin 2.4 (*)    GFR, Estimated 5 (*)    All other components within normal limits  HEPATIC FUNCTION PANEL - Abnormal; Notable for the following components:   Total Protein 5.0 (*)    Albumin 2.4 (*)    Total Bilirubin 1.2 (*)    Bilirubin, Direct 0.6 (*)    All other components within normal limits  POTASSIUM - Abnormal; Notable for the following components:   Potassium 5.6 (*)    All other components within normal limits  CBG MONITORING, ED - Abnormal; Notable for the following components:   Glucose-Capillary 103 (*)    All other components within normal limits  I-STAT VENOUS BLOOD GAS, ED - Abnormal; Notable for the following components:   pCO2, Ven 60.3 (*)    pO2, Ven 111 (*)    Sodium 131 (*)    Potassium 7.2 (*)    HCT 28.0 (*)    Hemoglobin 9.5 (*)    All other components within normal limits  POC OCCULT BLOOD, ED - Abnormal; Notable  for the following components:   Fecal Occult Bld POSITIVE (*)    All other components within normal limits  I-STAT VENOUS BLOOD GAS, ED - Abnormal; Notable for the following components:   pO2, Ven 73 (*)    Sodium 132 (*)    Potassium 6.4 (*)    Calcium, Ion 1.11 (*)    HCT 26.0 (*)    Hemoglobin 8.8  (*)    All other components within normal limits  TROPONIN I (HIGH SENSITIVITY) - Abnormal; Notable for the following components:   Troponin I (High Sensitivity) 252 (*)    All other components within normal limits  TROPONIN I (HIGH SENSITIVITY) - Abnormal; Notable for the following components:   Troponin I (High Sensitivity) 230 (*)    All other components within normal limits  HEPATITIS B SURFACE ANTIGEN  HEPATITIS B SURFACE ANTIBODY, QUANTITATIVE    EKG EKG Interpretation Date/Time:  Friday January 02 2023 15:13:24 EST Ventricular Rate:  63 PR Interval:  262 QRS Duration:  180 QT Interval:  512 QTC Calculation: 525 R Axis:   -78  Text Interpretation: Sinus rhythm Prolonged PR interval Left bundle branch block No significant change since last tracing Confirmed by Richardean Canal [phone]) on 01/02/2023 4:19:25 PM  Radiology CT ABDOMEN PELVIS WO CONTRAST  Result Date: 01/02/2023 CLINICAL DATA:  Abdomen pain nausea vomiting hypotension dialysis patient EXAM: CT ABDOMEN AND PELVIS WITHOUT CONTRAST TECHNIQUE: Multidetector CT imaging of the abdomen and pelvis was performed following the standard protocol without IV contrast. RADIATION DOSE REDUCTION: This exam was performed according to the departmental dose-optimization program which includes automated exposure control, adjustment of the mA and/or kV according to patient size and/or use of iterative reconstruction technique. COMPARISON:  CT 09/19/2022 FINDINGS: Lower chest: Lung bases demonstrate small pleural effusions and cardiomegaly. No acute airspace disease. Coronary vascular calcifications. Aortic valve prosthesis. Hepatobiliary: No calcified gallstone or biliary dilatation. No focal hepatic abnormality. Pancreas: Unremarkable. No pancreatic ductal dilatation or surrounding inflammatory changes. Spleen: Normal in size without focal abnormality. Adrenals/Urinary Tract: Adrenal glands are normal. Kidneys show no hydronephrosis. Extensive  renal vascular calcifications. Probable renal cysts for which no imaging follow-up is recommended. Decompressed urinary bladder Stomach/Bowel: Stomach nonenlarged. There is no dilated small bowel. Diffuse diverticular disease of the colon. Under distended colon versus mild colitis type changes. No pericolonic inflammation. Vascular/Lymphatic: Advanced aortic atherosclerosis. No aneurysm. Mildly enlarged retroperitoneal lymph nodes without significant change. Reproductive: Uterus and bilateral adnexa are unremarkable. Other: Negative for free air. Small volume free fluid within the abdomen and pelvis. Generalized subcutaneous edema consistent with anasarca Musculoskeletal: Posterior lumbar fusion hardware at L3-L4. Chronic deformity at L2. Chronic endplate irregularity at L2-L3. Diffuse skeletal sclerosis likely corresponding to chronic kidney disease history. IMPRESSION: 1. Negative for bowel obstruction or free air. Under distended colon versus mild colitis type changes. Diffuse diverticular disease. 2. Small volume free fluid within the abdomen and pelvis. Generalized subcutaneous edema consistent with anasarca. Small pleural effusions. Cardiomegaly. 3. Aortic atherosclerosis. Aortic Atherosclerosis (ICD10-I70.0). Electronically Signed   By: Jasmine Pang M.D.   On: 01/02/2023 21:46   CT Head Wo Contrast  Result Date: 01/02/2023 CLINICAL DATA:  Mental status change EXAM: CT HEAD WITHOUT CONTRAST TECHNIQUE: Contiguous axial images were obtained from the base of the skull through the vertex without intravenous contrast. RADIATION DOSE REDUCTION: This exam was performed according to the departmental dose-optimization program which includes automated exposure control, adjustment of the mA and/or kV according to patient size and/or use of iterative  reconstruction technique. COMPARISON:  MRI 09/20/2022, CT brain 05/02/2021 FINDINGS: Brain: No acute territorial infarction, hemorrhage or intracranial mass. Mild to  moderate white matter hypodensity consistent with chronic small vessel ischemic change. Stable ventricular size Vascular: No hyperdense vessels.  Carotid vascular calcification. Skull: No fracture. Diffuse sclerosis probably due to history of chronic kidney disease through Sinuses/Orbits: No acute finding. Other: None IMPRESSION: 1. No CT evidence for acute intracranial abnormality. 2. Mild to moderate chronic small vessel ischemic changes of the white matter. Electronically Signed   By: Jasmine Pang M.D.   On: 01/02/2023 21:33   DG Chest 2 View  Result Date: 01/02/2023 CLINICAL DATA:  Chest pain and weakness. EXAM: CHEST - 2 VIEW COMPARISON:  12/19/2022. FINDINGS: There is moderate diffuse pulmonary vascular congestion/edema. No dense consolidation or lung collapse. Bilateral costophrenic angles are clear. No pneumothorax. Stable mildly enlarged cardio-mediastinal silhouette, which is accentuated by low lung volume. There are surgical staples along the heart border and sternotomy wires, status post CABG (coronary artery bypass graft). Prosthetic aortic valve and presumed cardiac monitor noted overlying the left anterior chest wall. No acute osseous abnormalities. Lower cervical spinal fixation hardware seen. The soft tissues are within normal limits. IMPRESSION: *Findings favor congestive heart failure/pulmonary edema. Electronically Signed   By: Jules Schick M.D.   On: 01/02/2023 15:40    Procedures Procedures    Medications Ordered in ED Medications  Chlorhexidine Gluconate Cloth 2 % PADS 6 each (has no administration in time range)  acetaminophen (TYLENOL) tablet 1,000 mg (has no administration in time range)  lactated ringers bolus 500 mL (0 mLs Intravenous Stopped 01/02/23 2203)  pantoprazole (PROTONIX) injection 80 mg (80 mg Intravenous Given 01/02/23 2110)    ED Course/ Medical Decision Making/ A&P                                 Medical Decision Making Amount and/or Complexity of  Data Reviewed Labs: ordered. Radiology: ordered.  Risk OTC drugs. Prescription drug management. Decision regarding hospitalization.   Metabolic panel with sodium 133, potassium 4.3.  CBC with hemoglobin 8.2, decreased from 9.9 most recently.  Troponin 252, 230 on repeat.  Blood gas with pH 7.26, pCO2 60.  Patient without focal neurologic deficits concerning for stroke, however is somnolent upon initial evaluation.  Blood gas concerning for hypercarbia with pCO2 of 60.  Patient placed on BiPAP and reevaluated in is more alert and reports her breathing feels better.  CT scan was obtained, showed no acute intracranial pathology or intra-abdominal process to explain the patient's symptoms.  I was alerted by the patient's primary nurse that she had a bloody bowel movement.  Cardiology and nephrology services were consulted and nephrology reported they would likely dialyze the patient tonight.  Patient was sent to gastroenterology to make her aware of this patient for consultation in the morning.  Patient was discussed with the internal medicine service, who has accepted her for admission.        Final Clinical Impression(s) / ED Diagnoses Final diagnoses:  Hypotension, unspecified hypotension type    Rx / DC Orders ED Discharge Orders     None         Janyth Pupa, MD 01/02/23 2306    Charlynne Pander, MD 01/03/23 1730

## 2023-01-02 NOTE — ED Notes (Signed)
Lawrence, son, will be leaving but plans on calling for updates.  [phone]

## 2023-01-03 DIAGNOSIS — I25810 Atherosclerosis of coronary artery bypass graft(s) without angina pectoris: Secondary | ICD-10-CM | POA: Diagnosis not present

## 2023-01-03 DIAGNOSIS — J9691 Respiratory failure, unspecified with hypoxia: Secondary | ICD-10-CM | POA: Insufficient documentation

## 2023-01-03 DIAGNOSIS — I5082 Biventricular heart failure: Secondary | ICD-10-CM | POA: Diagnosis not present

## 2023-01-03 DIAGNOSIS — N186 End stage renal disease: Secondary | ICD-10-CM | POA: Diagnosis not present

## 2023-01-03 DIAGNOSIS — K922 Gastrointestinal hemorrhage, unspecified: Secondary | ICD-10-CM | POA: Diagnosis not present

## 2023-01-03 DIAGNOSIS — D696 Thrombocytopenia, unspecified: Secondary | ICD-10-CM | POA: Insufficient documentation

## 2023-01-03 DIAGNOSIS — I50810 Right heart failure, unspecified: Secondary | ICD-10-CM | POA: Diagnosis not present

## 2023-01-03 DIAGNOSIS — I5A Non-ischemic myocardial injury (non-traumatic): Secondary | ICD-10-CM

## 2023-01-03 DIAGNOSIS — Z952 Presence of prosthetic heart valve: Secondary | ICD-10-CM

## 2023-01-03 DIAGNOSIS — N19 Unspecified kidney failure: Secondary | ICD-10-CM

## 2023-01-03 DIAGNOSIS — I5031 Acute diastolic (congestive) heart failure: Secondary | ICD-10-CM

## 2023-01-03 LAB — BASIC METABOLIC PANEL
Anion gap: 12 (ref 5–15)
BUN: 22 mg/dL (ref 8–23)
CO2: 25 mmol/L (ref 22–32)
Calcium: 8.3 mg/dL — ABNORMAL LOW (ref 8.9–10.3)
Chloride: 96 mmol/L — ABNORMAL LOW (ref 98–111)
Creatinine, Ser: 4.55 mg/dL — ABNORMAL HIGH (ref 0.44–1.00)
GFR, Estimated: 10 mL/min — ABNORMAL LOW (ref >60)
Glucose, Bld: 61 mg/dL — ABNORMAL LOW (ref 70–99)
Potassium: 3.1 mmol/L — ABNORMAL LOW (ref 3.5–5.1)
Sodium: 133 mmol/L — ABNORMAL LOW (ref 135–145)

## 2023-01-03 LAB — TECHNOLOGIST SMEAR REVIEW

## 2023-01-03 LAB — DIFFERENTIAL
Abs Immature Granulocytes: 0.02 10*3/uL (ref 0.00–0.07)
Basophils Absolute: 0 10*3/uL (ref 0.0–0.1)
Basophils Relative: 0 %
Eosinophils Absolute: 1.1 10*3/uL — ABNORMAL HIGH (ref 0.0–0.5)
Eosinophils Relative: 21 %
Immature Granulocytes: 0 %
Lymphocytes Relative: 9 %
Lymphs Abs: 0.4 10*3/uL — ABNORMAL LOW (ref 0.7–4.0)
Monocytes Absolute: 0.4 10*3/uL (ref 0.1–1.0)
Monocytes Relative: 8 %
Neutro Abs: 3 10*3/uL (ref 1.7–7.7)
Neutrophils Relative %: 62 %

## 2023-01-03 LAB — GLUCOSE, CAPILLARY: Glucose-Capillary: 89 mg/dL (ref 70–99)

## 2023-01-03 LAB — IRON AND TIBC
Iron: 57 ug/dL (ref 28–170)
Saturation Ratios: 40 % — ABNORMAL HIGH (ref 10.4–31.8)
TIBC: 141 ug/dL — ABNORMAL LOW (ref 250–450)
UIBC: 84 ug/dL

## 2023-01-03 LAB — CBC
HCT: 27.2 % — ABNORMAL LOW (ref 36.0–46.0)
Hemoglobin: 8.2 g/dL — ABNORMAL LOW (ref 12.0–15.0)
MCH: 32.3 pg (ref 26.0–34.0)
MCHC: 30.1 g/dL (ref 30.0–36.0)
MCV: 107.1 fL — ABNORMAL HIGH (ref 80.0–100.0)
Platelets: 100 10*3/uL — ABNORMAL LOW (ref 150–400)
RBC: 2.54 MIL/uL — ABNORMAL LOW (ref 3.87–5.11)
RDW: 15.9 % — ABNORMAL HIGH (ref 11.5–15.5)
WBC: 5 10*3/uL (ref 4.0–10.5)
nRBC: 0 % (ref 0.0–0.2)

## 2023-01-03 LAB — FERRITIN: Ferritin: 569 ng/mL — ABNORMAL HIGH (ref 11–307)

## 2023-01-03 LAB — PROTIME-INR
INR: 1.3 — ABNORMAL HIGH (ref 0.8–1.2)
Prothrombin Time: 16.3 s — ABNORMAL HIGH (ref 11.4–15.2)

## 2023-01-03 LAB — PHOSPHORUS: Phosphorus: 2.4 mg/dL — ABNORMAL LOW (ref 2.5–4.6)

## 2023-01-03 LAB — MAGNESIUM: Magnesium: 1.7 mg/dL (ref 1.7–2.4)

## 2023-01-03 LAB — HIV ANTIBODY (ROUTINE TESTING W REFLEX): HIV Screen 4th Generation wRfx: NONREACTIVE

## 2023-01-03 MED ORDER — CARVEDILOL 25 MG PO TABS
25.0000 mg | ORAL_TABLET | Freq: Two times a day (BID) | ORAL | Status: DC
  Administered 2023-01-03 – 2023-01-07 (×7): 25 mg via ORAL
  Filled 2023-01-03 (×7): qty 1

## 2023-01-03 MED ORDER — CHLORHEXIDINE GLUCONATE CLOTH 2 % EX PADS: 6.0000 | MEDICATED_PAD | Freq: Every day | CUTANEOUS | Status: DC

## 2023-01-03 MED ORDER — ISOSORBIDE MONONITRATE ER 60 MG PO TB24
60.0000 mg | ORAL_TABLET | Freq: Every day | ORAL | Status: DC
  Administered 2023-01-03 – 2023-01-07 (×5): 60 mg via ORAL
  Filled 2023-01-03 (×5): qty 1

## 2023-01-03 MED ORDER — ACETAMINOPHEN 325 MG PO TABS: 650.0000 mg | ORAL_TABLET | Freq: Four times a day (QID) | ORAL | Status: DC | PRN

## 2023-01-03 MED ORDER — REVEFENACIN 175 MCG/3ML IN SOLN
175.0000 ug | Freq: Every day | RESPIRATORY_TRACT | Status: DC
  Administered 2023-01-03 – 2023-01-07 (×3): 175 ug via RESPIRATORY_TRACT
  Filled 2023-01-03 (×5): qty 3

## 2023-01-03 MED ORDER — ACETAMINOPHEN 500 MG PO TABS
1000.0000 mg | ORAL_TABLET | Freq: Three times a day (TID) | ORAL | Status: DC | PRN
  Administered 2023-01-03 – 2023-01-07 (×6): 1000 mg via ORAL
  Filled 2023-01-03 (×7): qty 2

## 2023-01-03 MED ORDER — LORAZEPAM 0.5 MG PO TABS
0.5000 mg | ORAL_TABLET | Freq: Three times a day (TID) | ORAL | Status: DC | PRN
  Administered 2023-01-06: 0.5 mg via ORAL
  Filled 2023-01-03: qty 1

## 2023-01-03 MED ORDER — ARFORMOTEROL TARTRATE 15 MCG/2ML IN NEBU
15.0000 ug | INHALATION_SOLUTION | Freq: Two times a day (BID) | RESPIRATORY_TRACT | Status: DC
  Administered 2023-01-03 – 2023-01-07 (×6): 15 ug via RESPIRATORY_TRACT
  Filled 2023-01-03 (×8): qty 2

## 2023-01-03 MED ORDER — ONDANSETRON 4 MG PO TBDP
4.0000 mg | ORAL_TABLET | Freq: Three times a day (TID) | ORAL | Status: DC | PRN
  Administered 2023-01-03 – 2023-01-06 (×2): 4 mg via ORAL
  Filled 2023-01-03 (×2): qty 1

## 2023-01-03 MED ORDER — BUDESONIDE 0.25 MG/2ML IN SUSP
0.2500 mg | Freq: Two times a day (BID) | RESPIRATORY_TRACT | Status: DC
  Administered 2023-01-03 – 2023-01-07 (×7): 0.25 mg via RESPIRATORY_TRACT
  Filled 2023-01-03 (×9): qty 2

## 2023-01-03 MED ORDER — ONDANSETRON HCL 4 MG/2ML IJ SOLN
4.0000 mg | Freq: Three times a day (TID) | INTRAMUSCULAR | Status: DC | PRN
  Administered 2023-01-04 – 2023-01-06 (×2): 4 mg via INTRAVENOUS
  Filled 2023-01-03 (×2): qty 2

## 2023-01-03 MED ORDER — IPRATROPIUM-ALBUTEROL 0.5-2.5 (3) MG/3ML IN SOLN
3.0000 mL | Freq: Four times a day (QID) | RESPIRATORY_TRACT | Status: DC | PRN
  Administered 2023-01-05: 3 mL via RESPIRATORY_TRACT
  Filled 2023-01-03: qty 3

## 2023-01-03 MED ORDER — PREDNISONE 5 MG PO TABS
5.0000 mg | ORAL_TABLET | Freq: Every day | ORAL | Status: DC
  Administered 2023-01-03 – 2023-01-07 (×5): 5 mg via ORAL
  Filled 2023-01-03 (×6): qty 1

## 2023-01-03 MED ORDER — PANTOPRAZOLE SODIUM 40 MG IV SOLR
40.0000 mg | Freq: Two times a day (BID) | INTRAVENOUS | Status: DC
  Administered 2023-01-03 – 2023-01-07 (×9): 40 mg via INTRAVENOUS
  Filled 2023-01-03 (×9): qty 10

## 2023-01-03 NOTE — Plan of Care (Signed)
Care Plan  ?

## 2023-01-03 NOTE — Progress Notes (Signed)
EKG ordered but pt in Hemodialysis. Will notify day nurse.

## 2023-01-03 NOTE — Plan of Care (Signed)
  Problem: Activity: Goal: Risk for activity intolerance will decrease Outcome: Progressing   Problem: Nutrition: Goal: Adequate nutrition will be maintained Outcome: Progressing   Problem: Coping: Goal: Level of anxiety will decrease Outcome: Progressing   Problem: Elimination: Goal: Will not experience complications related to bowel motility Outcome: Progressing   

## 2023-01-03 NOTE — ED Notes (Signed)
ED TO INPATIENT HANDOFF REPORT  ED Nurse Name and Phone #: Norlene Duel [phone]  S Name/Age/Gender Jodi Bell 62 y.o. female Room/Bed: 015C/015C  Code Status   Code Status: Full Code  Home/SNF/Other Home Patient oriented to: self, place, time, and situation Is this baseline? Yes   Triage Complete: Triage complete  Chief Complaint GI bleed [K92.2]  Triage Note Pt BIB [facility] from Dialysis MWF, Pt missed Dialysis on Wed. 1/3 of tx completed today.  Episode of SOB, NV, Hypotension SBP 80 per Dialysis staff.  Lung sounds clear but diminished w/o distress per EMS.    Pt does not feel well.  EMS endorsed Vomit and diarhhea at Dialysis facility.   Recent heart valve procedure. Pt does not endorse CP now but has had some the last few days.  106/54 98% 2L HR 64 RR 20 GCS 15, Pt wears 2L at baseline   Allergies Allergies  Allergen Reactions   3-Methyl-2-Benzothiazolinone Hydrazone Other (See Comments)    Muscle weakness muscle cramping in legs Other reaction(s): Myalgias (Muscle Pain)   Banana Anaphylaxis, Swelling and Other (See Comments)    TONGUE AND MOUTH SWELLING   Black Walnut Flavor Anaphylaxis and Itching    Walnuts   Hazelnut (Filbert) Anaphylaxis    Hazelnuts   Leflunomide And Related Other (See Comments)    Severe Headache   Lisinopril Swelling    Angioedema  Swelling of the face   No [employer] Food Allergies Anaphylaxis    Spicy Mustard 4.7.2021 Pt reports that she eats Regular Yellow Mustard Swelling and itching of tongue   Other Anaphylaxis, Swelling and Other (See Comments)    Cosentyx= angioedema Mouth itches and tongue swelling Hazel nuts and pecans also Walnuts Renal failure Serotonin syndrome  Tremors Muscle weakness muscle cramping in legs Other reaction(s): Myalgias (Muscle Pain)   Pecan Nut (Diagnostic) Anaphylaxis and Itching    Pecans   Trazodone And Nefazodone Other (See Comments)    Serotonin Syndrome  Tremors   Adalimumab Other  (See Comments)    Blisters on abdomen =humira   Escitalopram Oxalate Other (See Comments)    Hand problems - Serotonin Syndrome     Ezetimibe Other (See Comments)    myalgias cramps    Secukinumab Swelling    Cosentyx = angiodema    Statins Other (See Comments)    Leg pains and weakness   Ferrlecit [Na Ferric Gluc Cplx In Sucrose]     Not reaction given from HD   Gabapentin Other (See Comments)    tremors   Nsaids     Avoid due to renal problems     Level of Care/Admitting Diagnosis ED Disposition     ED Disposition  Admit   Condition  --   Comment  Hospital Area: [facility] [[id number]]  Level of Care: Telemetry Medical [[id number]]  May admit patient to [facility] or [facility] if equivalent level of care is available:: No  Covid Evaluation: Asymptomatic - no recent exposure (last 10 days) testing not required  Diagnosis: GI bleed [454098]  Admitting Physician: Ginnie Smart [[id number]]  Attending Physician: Ninetta Lights, JEFFREY C [[id number]]  Certification:: I certify this patient will need inpatient services for at least 2 midnights  Expected Medical Readiness: 01/05/2023          B Medical/Surgery History Past Medical History:  Diagnosis Date   Anemia of chronic disease    Anxiety    Arthritis    oa and psoriatic ra  Asthma    Back pain, chronic    lower back   Chronic insomnia    COPD (chronic obstructive pulmonary disease) (HCC)    Diabetes mellitus    type 2   ESRD on dialysis [facility])    Essential hypertension    GERD (gastroesophageal reflux disease)    Gout    History of hiatal hernia    Hypoalbuminemia    Hyponatremia    Mild aortic stenosis    Mitral regurgitation    Mitral stenosis    Obesity    PAD (peripheral artery disease) (HCC)    a. externial iliac calcification seen on CT 04/2020.   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 Lynnette Caffey & Laneta Simmers.    Sleep apnea    Tobacco abuse    Upper GI bleed 05/2019   Past Surgical History:  Procedure Laterality Date   BACK SURGERY  2016   lower back fusion with cage   BIOPSY  09/23/2017   Procedure: BIOPSY;  Surgeon: Graylin Shiver, MD;  Location: [facility] ENDOSCOPY;  Service: Endoscopy;;   BIOPSY  05/19/2019   Procedure: BIOPSY;  Surgeon: Kathi Der, MD;  Location: [facility] ENDOSCOPY;  Service: Gastroenterology;;   CESAREAN SECTION  1990   x 1    COLONOSCOPY WITH PROPOFOL N/A 09/23/2017   Procedure: COLONOSCOPY WITH PROPOFOL Hemostatic clips placed;  Surgeon: Graylin Shiver, MD;  Location: [facility] ENDOSCOPY;  Service: Endoscopy;  Laterality: N/A;   COLONOSCOPY WITH PROPOFOL N/A 10/02/2020   Procedure: COLONOSCOPY WITH PROPOFOL;  Surgeon: Willis Modena, MD;  Location: [facility] ENDOSCOPY;  Service: Endoscopy;  Laterality: N/A;   CORONARY ARTERY BYPASS GRAFT N/A 09/13/2020   Procedure: CORONARY ARTERY BYPASS GRAFTING (CABG), ON PUMP, TIMES TWO, USING LEFT INTERNAL MAMMARY ARTERY AND RIGHT ENDOSCOPICALLY HARVESTED GREATER SAPHENOUS VEIN;  Surgeon: Alleen Borne, MD;  Location: [facility] OR;  Service: Open Heart Surgery;  Laterality: N/A;   DILATION AND CURETTAGE OF UTERUS  1988   ENTEROSCOPY N/A 07/22/2021   Procedure: ENTEROSCOPY;  Surgeon: Kerin Salen, MD;  Location: [facility] ENDOSCOPY;  Service: Gastroenterology;  Laterality: N/A;   ESOPHAGOGASTRODUODENOSCOPY N/A 09/22/2020   Procedure: ESOPHAGOGASTRODUODENOSCOPY (EGD);  Surgeon: Willis Modena, MD;  Location: [facility] ENDOSCOPY;  Service: Endoscopy;  Laterality: N/A;   ESOPHAGOGASTRODUODENOSCOPY (EGD) WITH PROPOFOL N/A 09/23/2017   Procedure: ESOPHAGOGASTRODUODENOSCOPY (EGD) WITH PROPOFOL;  Surgeon: Graylin Shiver, MD;  Location: [facility];  Service: Endoscopy;  Laterality: N/A;   ESOPHAGOGASTRODUODENOSCOPY (EGD) WITH PROPOFOL N/A 05/19/2019   Procedure: ESOPHAGOGASTRODUODENOSCOPY (EGD) WITH PROPOFOL;  Surgeon: Kathi Der, MD;  [facility];  Service:  Gastroenterology;  Laterality: N/A;   ESOPHAGOGASTRODUODENOSCOPY (EGD) WITH PROPOFOL N/A 10/02/2020   Procedure: ESOPHAGOGASTRODUODENOSCOPY (EGD) WITH PROPOFOL;  Surgeon: Willis Modena, MD;  Location: [facility] ENDOSCOPY;  Service: Endoscopy;  Laterality: N/A;   GIVENS CAPSULE STUDY N/A 05/19/2019   Procedure: GIVENS CAPSULE STUDY;  Surgeon: Kathi Der, MD;  [facility]OS[facility];  Service: Gastroenterology;  Laterality: N/A;   GIVENS CAPSULE STUDY N/A 07/19/2021   Procedure: GIVENS CAPSULE STUDY;  Surgeon: [name], MD;  Location: [facility] ENDOSCOPY;  Service: Gastroenterology;  Laterality: N/A;   HEMOSTASIS CLIP PLACEMENT  09/22/2020   Procedure: HEMOSTASIS CLIP PLACEMENT;  Surgeon: Willis Modena, MD;  [facility];  Service: Endoscopy;;   HEMOSTASIS CONTROL  09/22/2020   Procedure: HEMOSTASIS CONTROL;  Surgeon: Willis Modena, MD;  Location: [facility] ENDOSCOPY;  Service: Endoscopy;;   HOT HEMOSTASIS N/A  05/19/2019   Procedure: HOT HEMOSTASIS (ARGON PLASMA COAGULATION/BICAP);  Surgeon: Kathi Der, MD;  Location: [facility] ENDOSCOPY;  Service: Gastroenterology;  Laterality: N/A;   INTRAOPERATIVE TRANSTHORACIC ECHOCARDIOGRAM N/A 12/23/2022   Procedure: INTRAOPERATIVE TRANSTHORACIC ECHOCARDIOGRAM;  Surgeon: [name], MD;  [facility] INVASIVE CV LAB;  Service: Cardiovascular;  Laterality: N/A;   LEFT HEART CATH AND CORONARY ANGIOGRAPHY N/A 09/10/2020   Procedure: LEFT HEART CATH AND CORONARY ANGIOGRAPHY;  Surgeon: Yvonne Kendall, MD;  [facility] INVAS[facility] CV LAB;  Service: Cardiovascular;  Laterality: N/A;   PARTIAL KNEE ARTHROPLASTY Left 11/12/2017   Procedure: left unicompartmental arthroplasty-medial;  Surgeon: Durene Romans, MD;  Location: [facility] ORS;  Service: Orthopedics;  Laterality: Left;    RIGHT HEART CATH AND CORONARY/GRAFT ANGIOGRAPHY N/A 06/26/2022   Procedure: RIGHT HEART CATH AND CORONARY/GRAFT ANGIOGRAPHY;  Surgeon: [name], MD;  [facility] INVASIVE CV LAB;   Service: Cardiovascular;  Laterality: N/A;   SUBMUCOSAL INJECTION  09/23/2017   Procedure: SUBMUCOSAL INJECTION;  Surgeon: Graylin Shiver, MD;  Location[facility] ENDOS[facility];  Service: Endoscopy;;  in colon   TEE WITHOUT CARDIOVERSION N/A 09/13/2020   Procedure: TRANSESOPHAGEAL ECHOCARDIOGRAM (TEE);  Surgeon: Alleen Borne, MD;  Location: [facility] OR;  Service: Open Heart Surgery;  Laterality: N/A;   TUBAL LIGATION  1990     A IV Location/Drains/Wounds Patient Lines/Drains/Airways Status     Active Line/Drains/Airways     Name Placement date Placement time Site Days   Peripheral IV 01/02/23 20 G Anterior;Distal;Right;Upper Arm 01/02/23  1746  Arm  1   Fistula / Graft Left Upper arm 05/17/18  0800  Upper arm  1692            Intake/Output Last 24 hours No intake or output data in the 24 hours ending 01/03/23 0009  Labs/Imaging Results for orders placed or performed during the hospital encounter of 01/02/23 (from the past 48 hour(s))  CBG monitoring, ED     Status: Abnormal   Collection Time: 01/02/23  2:10 PM  Result Value Ref Range   Glucose-Capillary 103 (H) 70 - 99 mg/dL    Comment: Glucose reference range applies only to samples taken after fasting for at least 8 hours.  Basic metabolic panel     Status: Abnormal   Collection Time: 01/02/23  3:14 PM  Result Value Ref Range   Sodium 133 (L) 135 - 145 mmol/L   Potassium 4.3 3.5 - 5.1 mmol/L   Chloride 101 98 - 111 mmol/L   CO2 24 22 - 32 mmol/L   Glucose, Bld 92 70 - 99 mg/dL    Comment: Glucose reference range applies only to samples taken after fasting for at least 8 hours.   BUN 50 (H) 8 - 23 mg/dL   Creatinine, Ser 8.29 (H) 0.44 - 1.00 mg/dL   Calcium 8.7 (L) 8.9 - 10.3 mg/dL   GFR, Estimated 5 (L) >60 mL/min    Comment: (NOTE) Calculated using the CKD-EPI Creatinine Equation (2021)    Anion gap 8 5 - 15    Comment: Performed at [facility], [address] [place], [place] [address]  CBC     Status: Abnormal    Collection Time: 01/02/23  3:14 PM  Result Value Ref Range   WBC 6.0 4.0 - 10.5 K/uL   RBC 2.65 (L) 3.87 - 5.11 MIL/uL   Hemoglobin 8.2 (L) 12.0 - 15.0 g/dL   HCT 08.6 (L) 57.8 - 46.9 %   MCV 108.7 (H) 80.0 -  100.0 fL   MCH 30.9 26.0 - 34.0 pg   MCHC 28.5 (L) 30.0 - 36.0 g/dL   RDW 17.6 (H) 16.0 - 73.7 %   Platelets 121 (L) 150 - 400 K/uL    Comment: REPEATED TO VERIFY   nRBC 0.0 0.0 - 0.2 %    Comment: Performed at [facility], [address], [place], [place] [address]  Troponin I (High Sensitivity)     Status: Abnormal   Collection Time: 01/02/23  3:14 PM  Result Value Ref Range   Troponin I (High Sensitivity) 252 (HH) <18 ng/L    Comment: CRITICAL RESULT CALLED TO, READ BACK BY AND VERIFIED WITH Carmie End RN @1616  01/02/23 E,[place] (NOTE) Elevated high sensitivity troponin I (hsTnI) values and significant  changes across serial measurements may suggest ACS but many other  chronic and acute conditions are known to elevate hsTnI results.  Refer to the "Links" section for chest pain algorithms and additional  guidance. Performed at [facility], [address], [place], [place] [address]   I-Stat venous blood gas, [facility], [facility], [facility])     Status: Abnormal   Collection Time: 01/02/23  4:00 PM  Result Value Ref Range   pH, Ven 7.264 7.25 - 7.43   pCO2, Ven 60.3 (H) 44 - 60 mmHg   pO2, Ven 111 (H) 32 - 45 mmHg   Bicarbonate 27.3 20.0 - 28.0 mmol/L   TCO2 29 22 - 32 mmol/L   O2 Saturation 97 %   Acid-Base Excess 0.0 0.0 - 2.0 mmol/L   Sodium 131 (L) 135 - 145 mmol/L   Potassium 7.2 (HH) 3.5 - 5.1 mmol/L   Calcium, Ion 1.15 1.15 - 1.40 mmol/L   HCT 28.0 (L) 36.0 - 46.0 %   Hemoglobin 9.5 (L) 12.0 - 15.0 g/dL   Sample type VENOUS    Comment NOTIFIED PHYSICIAN   Ammonia     Status: Abnormal   Collection Time: 01/02/23  5:40 PM  Result Value Ref Range   Ammonia 46 (H) 9 - 35 umol/L    Comment: Performed at [facility], [address] [place], [place]  [address]  Hepatitis B surface antigen     Status: None   Collection Time: 01/02/23  5:40 PM  Result Value Ref Range   Hepatitis B Surface Ag NON REACTIVE NON REACTIVE    Comment: Performed at [facility], [address], [place], [place] [address]  Renal function panel     Status: Abnormal   Collection Time: 01/02/23  6:11 PM  Result Value Ref Range   Sodium 131 (L) 135 - 145 mmol/L   Potassium 6.2 (H) 3.5 - 5.1 mmol/L   Chloride 100 98 - 111 mmol/L   CO2 22 22 - 32 mmol/L   Glucose, Bld 90 70 - 99 mg/dL    Comment: Glucose reference range applies only to samples taken after fasting for at least 8 hours.   BUN 51 (H) 8 - 23 mg/dL   Creatinine, Ser 8.18 (H) 0.44 - 1.00 mg/dL   Calcium 8.3 (L) 8.9 - 10.3 mg/dL   Phosphorus 4.0 2.5 - 4.6 mg/dL   Albumin 2.4 (L) 3.5 - 5.0 g/dL   GFR, Estimated 5 (L) >60 mL/min    Comment: (NOTE) Calculated using the CKD-EPI Creatinine Equation (2021)    Anion gap 9 5 - 15    Comment: Performed at [facility], [address], [place],   [address]  Hepatic function panel     Status: Abnormal   Collection Time: 01/02/23  6:11 PM  Result Value Ref Range   Total Protein 5.0 (L) 6.5 - 8.1 g/dL   Albumin 2.4 (L) 3.5 - 5.0 g/dL   AST 22 15 - 41 U/L   ALT 11 0 - 44 U/L   Alkaline Phosphatase 48 38 - 126 U/L   Total Bilirubin 1.2 (H) <1.2 mg/dL   Bilirubin, Direct 0.6 (H) 0.0 - 0.2 mg/dL   Indirect Bilirubin 0.6 0.3 - 0.9 mg/dL    Comment: Performed at [facility], [address] [place], [place] [address]  Troponin I (High Sensitivity)     Status: Abnormal   Collection Time: 01/02/23  6:11 PM  Result Value Ref Range   Troponin I (High Sensitivity) 230 (HH) <18 ng/L    Comment: CRITICAL VALUE NOTED. VALUE IS CONSISTENT WITH PREVIOUSLY REPORTED/CALLED VALUE (NOTE) Elevated high sensitivity troponin I (hsTnI) values and significant  changes across serial measurements may suggest ACS but many other  chronic and acute conditions  are known to elevate hsTnI results.  Refer to the "Links" section for chest pain algorithms and additional  guidance. Performed at [facility], [address] [place], [place] [address]   POC occult blood, ED     Status: Abnormal   Collection Time: 01/02/23  6:12 PM  Result Value Ref Range   Fecal Occult Bld POSITIVE (A) NEGATIVE  I-Stat venous blood gas, ([facility] ED, [facility], [facility])     Status: Abnormal   Collection Time: 01/02/23  8:45 PM  Result Value Ref Range   pH, Ven 7.324 7.25 - 7.43   pCO2, Ven 48.2 44 - 60 mmHg   pO2, Ven 73 (H) 32 - 45 mmHg   Bicarbonate 25.1 20.0 - 28.0 mmol/L   TCO2 27 22 - 32 mmol/L   O2 Saturation 93 %   Acid-base deficit 1.0 0.0 - 2.0 mmol/L   Sodium 132 (L) 135 - 145 mmol/L   Potassium 6.4 (HH) 3.5 - 5.1 mmol/L   Calcium, Ion 1.11 (L) 1.15 - 1.40 mmol/L   HCT 26.0 (L) 36.0 - 46.0 %   Hemoglobin 8.8 (L) 12.0 - 15.0 g/dL   Sample type VENOUS    Comment NOTIFIED PHYSICIAN   Potassium     Status: Abnormal   Collection Time: 01/02/23  9:01 PM  Result Value Ref Range   Potassium 5.6 (H) 3.5 - 5.1 mmol/L    Comment: HEMOLYSIS AT THIS LEVEL MAY AFFECT RESULT Performed at [facility], [address] [place], [place] [address]    CT ABDOMEN PELVIS WO CONTRAST  Result Date: 01/02/2023 CLINICAL DATA:  Abdomen pain nausea vomiting hypotension dialysis patient EXAM: CT ABDOMEN AND PELVIS WITHOUT CONTRAST TECHNIQUE: Multidetector CT imaging of the abdomen and pelvis was performed following the standard protocol without IV contrast. RADIATION DOSE REDUCTION: This exam was performed according to the departmental dose-optimization program which includes automated exposure control, adjustment of the mA and/or kV according to patient size and/or use of iterative reconstruction technique. COMPARISON:  CT 09/19/2022 FINDINGS: Lower chest: Lung bases demonstrate small pleural effusions and cardiomegaly. No acute airspace disease. Coronary vascular calcifications.  Aortic valve prosthesis. Hepatobiliary: No calcified gallstone or biliary dilatation. No focal hepatic abnormality. Pancreas: Unremarkable. No pancreatic ductal dilatation or surrounding inflammatory changes. Spleen: Normal in size without focal abnormality. Adrenals/Urinary Tract: Adrenal glands are normal. Kidneys show no hydronephrosis. Extensive renal vascular  calcifications. Probable renal cysts for which no imaging follow-up is recommended. Decompressed urinary bladder Stomach/Bowel: Stomach nonenlarged. There is no dilated small bowel. Diffuse diverticular disease of the colon. Under distended colon versus mild colitis type changes. No pericolonic inflammation. Vascular/Lymphatic: Advanced aortic atherosclerosis. No aneurysm. Mildly enlarged retroperitoneal lymph nodes without significant change. Reproductive: Uterus and bilateral adnexa are unremarkable. Other: Negative for free air. Small volume free fluid within the abdomen and pelvis. Generalized subcutaneous edema consistent with anasarca Musculoskeletal: Posterior lumbar fusion hardware at L3-L4. Chronic deformity at L2. Chronic endplate irregularity at L2-L3. Diffuse skeletal sclerosis likely corresponding to chronic kidney disease history. IMPRESSION: 1. Negative for bowel obstruction or free air. Under distended colon versus mild colitis type changes. Diffuse diverticular disease. 2. Small volume free fluid within the abdomen and pelvis. Generalized subcutaneous edema consistent with anasarca. Small pleural effusions. Cardiomegaly. 3. Aortic atherosclerosis. Aortic Atherosclerosis (ICD10-I70.0). Electronically Signed   By: Jasmine Pang M.D.   On: 01/02/2023 21:46   CT Head Wo Contrast  Result Date: 01/02/2023 CLINICAL DATA:  Mental status change EXAM: CT HEAD WITHOUT CONTRAST TECHNIQUE: Contiguous axial images were obtained from the base of the skull through the vertex without intravenous contrast. RADIATION DOSE REDUCTION: This exam was  performed according to the departmental dose-optimization program which includes automated exposure control, adjustment of the mA and/or kV according to patient size and/or use of iterative reconstruction technique. COMPARISON:  MRI 09/20/2022, CT brain 05/02/2021 FINDINGS: Brain: No acute territorial infarction, hemorrhage or intracranial mass. Mild to moderate white matter hypodensity consistent with chronic small vessel ischemic change. Stable ventricular size Vascular: No hyperdense vessels.  Carotid vascular calcification. Skull: No fracture. Diffuse sclerosis probably due to history of chronic kidney disease through Sinuses/Orbits: No acute finding. Other: None IMPRESSION: 1. No CT evidence for acute intracranial abnormality. 2. Mild to moderate chronic small vessel ischemic changes of the white matter. Electronically Signed   By: Jasmine Pang M.D.   On: 01/02/2023 21:33   DG Chest 2 View  Result Date: 01/02/2023 CLINICAL DATA:  Chest pain and weakness. EXAM: CHEST - 2 VIEW COMPARISON:  12/19/2022. FINDINGS: There is moderate diffuse pulmonary vascular congestion/edema. No dense consolidation or lung collapse. Bilateral costophrenic angles are clear. No pneumothorax. Stable mildly enlarged cardio-mediastinal silhouette, which is accentuated by low lung volume. There are surgical staples along the heart border and sternotomy wires, status post CABG (coronary artery bypass graft). Prosthetic aortic valve and presumed cardiac monitor noted overlying the left anterior chest wall. No acute osseous abnormalities. Lower cervical spinal fixation hardware seen. The soft tissues are within normal limits. IMPRESSION: *Findings favor congestive heart failure/pulmonary edema. Electronically Signed   By: Jules Schick M.D.   On: 01/02/2023 15:40    Pending Labs Unresulted Labs (From admission, onward)     Start     Ordered   01/03/23 0500  CBC  Tomorrow morning,   R        01/03/23 0001   01/03/23 0500  Basic  metabolic panel  Tomorrow morning,   R        01/03/23 0001   01/03/23 0500  Protime-INR  Tomorrow morning,   R        01/03/23 0001   01/03/23 0001  Phosphorus  Add-on,   AD        01/03/23 0001   01/03/23 0001  Magnesium  Add-on,   AD        01/03/23 0001   01/02/23 2344  HIV Antibody (routine  testing w rflx)  (HIV Antibody (Routine testing w reflex) panel)  Once,   R        01/02/23 2346   01/02/23 1706  Hepatitis B surface antibody,quantitative  (New Admission Hemo Labs (Hepatitis B))  Once,   URGENT        01/02/23 1721   Unscheduled  Occult blood card to lab, stool  As needed,   R      01/02/23 1809   Signed and Held  Renal function panel  Once,   R        Signed and Held   Signed and Held  CBC  Once,   R        Signed and Held            Vitals/Pain Today's Vitals   01/02/23 2230 01/02/23 2300 01/02/23 2330 01/02/23 2344  BP: (!) 146/81 (!) 120/56 (!) 159/61   Pulse: 64 63 63   Resp: 19 16 15    Temp:      TempSrc:      SpO2: 100% 100% 100%   Weight:      Height:      PainSc:    7     Isolation Precautions No active isolations  Medications Medications  Chlorhexidine Gluconate Cloth 2 % PADS 6 each (has no administration in time range)  pantoprazole (PROTONIX) injection 40 mg (has no administration in time range)  revefenacin (YUPELRI) nebulizer solution 175 mcg (has no administration in time range)  arformoterol (BROVANA) nebulizer solution 15 mcg (has no administration in time range)  budesonide (PULMICORT) nebulizer solution 0.25 mg (has no administration in time range)  ipratropium-albuterol (DUONEB) 0.5-2.5 (3) MG/3ML nebulizer solution 3 mL (has no administration in time range)  lactated ringers bolus 500 mL (0 mLs Intravenous Stopped 01/02/23 2203)  pantoprazole (PROTONIX) injection 80 mg (80 mg Intravenous Given 01/02/23 2110)  acetaminophen (TYLENOL) tablet 1,000 mg (1,000 mg Oral Given 01/02/23 2344)    Mobility walks     Focused  Assessments Renal Assessment Handoff:  Hemodialysis Schedule: Hemodialysis Schedule: Monday/Wednesday/Friday Last Hemodialysis date and time: 01/02/2023 (partial)   Restricted appendage: left arm   R Recommendations: See Admitting Provider Note  Report given to:   Additional Notes: Went to dialysis today, could not finish due to hypotension, weakness. GI bleed in addition to this - hemoccult positive. Just had dark stool. Does not make urine. Left arm restriction.

## 2023-01-03 NOTE — Progress Notes (Signed)
RT attempted to place pt on CPAP after pt stated that she would like to go on around 2300, pt would not allow RT to place her on the CPAP. RT then tried to place back her supplemental oxygen and pt was not compliant. RT then notified RN.   01/03/23 2311  BiPAP/CPAP/SIPAP  Reason BIPAP/CPAP not in use Non-compliant (pt wont allow RT to place CPAP on.)

## 2023-01-03 NOTE — Progress Notes (Signed)
Progress Note  Patient Name: Jodi Bell Date of Encounter: 01/03/2023  Primary Cardiologist: Donato Schultz, MD  Subjective   No acute events overnight, 1.5L fluid removed this morning for hemodialysis.  Does not make urine.  Has severe headaches.  Inpatient Medications    Scheduled Meds:  arformoterol  15 mcg Nebulization BID   budesonide (PULMICORT) nebulizer solution  0.25 mg Nebulization BID   carvedilol  25 mg Oral BID WC   Chlorhexidine Gluconate Cloth  6 each Topical Q0600   isosorbide mononitrate  60 mg Oral Daily   pantoprazole (PROTONIX) IV  40 mg Intravenous Q12H   predniSONE  5 mg Oral Q breakfast   revefenacin  175 mcg Nebulization Daily   Continuous Infusions:  PRN Meds: acetaminophen, ipratropium-albuterol, LORazepam, ondansetron (ZOFRAN) IV **OR** ondansetron   Vital Signs    Vitals:   01/03/23 0600 01/03/23 0623 01/03/23 0636 01/03/23 0831  BP: (!) 125/56 (!) 143/69 (!) 163/60   Pulse: 74 74 73 73  Resp: 20 (!) 26 (!) 23   Temp:   97.6 F (36.4 C)   TempSrc:   Oral   SpO2: 100% 100% 100% 98%  Weight:  71.2 kg    Height:        Intake/Output Summary (Last 24 hours) at 01/03/2023 1156 Last data filed at 01/03/2023 0636 Gross per 24 hour  Intake 100 ml  Output 1501 ml  Net -1401 ml   Filed Weights   01/02/23 1421 01/03/23 0255 01/03/23 0623  Weight: 60 kg 72.6 kg 71.2 kg    Telemetry     Personally reviewed, normal sinus rhythm with LBBB.  ECG    Not performed today  Physical Exam   GEN: No acute distress.   Neck: JVD unable to examine due to patient in pain from headaches Cardiac: RRR, no murmur, rub, or gallop.  Respiratory: Bilateral Rales present GI: Soft, nontender, bowel sounds present. MS: No edema; No deformity. Neuro:  Nonfocal. Psych: Alert and oriented x 3. Normal affect.  Labs    Chemistry Recent Labs  Lab 01/02/23 1514 01/02/23 1600 01/02/23 1811 01/02/23 2045 01/02/23 2101 01/03/23 0745  NA 133*    < > 131* 132*  --  133*  K 4.3   < > 6.2* 6.4* 5.6* 3.1*  CL 101  --  100  --   --  96*  CO2 24  --  22  --   --  25  GLUCOSE 92  --  90  --   --  61*  BUN 50*  --  51*  --   --  22  CREATININE 8.17*  --  8.19*  --   --  4.55*  CALCIUM 8.7*  --  8.3*  --   --  8.3*  PROT  --   --  5.0*  --   --   --   ALBUMIN  --   --  2.4*  2.4*  --   --   --   AST  --   --  22  --   --   --   ALT  --   --  11  --   --   --   ALKPHOS  --   --  48  --   --   --   BILITOT  --   --  1.2*  --   --   --   GFRNONAA 5*  --  5*  --   --  10*  ANIONGAP 8  --  9  --   --  12   < > = values in this interval not displayed.     Hematology Recent Labs  Lab 01/02/23 1514 01/02/23 1600 01/02/23 2045 01/03/23 0745  WBC 6.0  --   --  5.0  RBC 2.65*  --   --  2.54*  HGB 8.2* 9.5* 8.8* 8.2*  HCT 28.8* 28.0* 26.0* 27.2*  MCV 108.7*  --   --  107.1*  MCH 30.9  --   --  32.3  MCHC 28.5*  --   --  30.1  RDW 15.9*  --   --  15.9*  PLT 121*  --   --  100*    Cardiac Enzymes Recent Labs  Lab 01/02/23 1514 01/02/23 1811  TROPONINIHS 252* 230*    BNPNo results for input(s): "BNP", "PROBNP" in the last 168 hours.   DDimerNo results for input(s): "DDIMER" in the last 168 hours.   Radiology    CT ABDOMEN PELVIS WO CONTRAST  Result Date: 01/02/2023 CLINICAL DATA:  Abdomen pain nausea vomiting hypotension dialysis patient EXAM: CT ABDOMEN AND PELVIS WITHOUT CONTRAST TECHNIQUE: Multidetector CT imaging of the abdomen and pelvis was performed following the standard protocol without IV contrast. RADIATION DOSE REDUCTION: This exam was performed according to the departmental dose-optimization program which includes automated exposure control, adjustment of the mA and/or kV according to patient size and/or use of iterative reconstruction technique. COMPARISON:  CT 09/19/2022 FINDINGS: Lower chest: Lung bases demonstrate small pleural effusions and cardiomegaly. No acute airspace disease. Coronary vascular  calcifications. Aortic valve prosthesis. Hepatobiliary: No calcified gallstone or biliary dilatation. No focal hepatic abnormality. Pancreas: Unremarkable. No pancreatic ductal dilatation or surrounding inflammatory changes. Spleen: Normal in size without focal abnormality. Adrenals/Urinary Tract: Adrenal glands are normal. Kidneys show no hydronephrosis. Extensive renal vascular calcifications. Probable renal cysts for which no imaging follow-up is recommended. Decompressed urinary bladder Stomach/Bowel: Stomach nonenlarged. There is no dilated small bowel. Diffuse diverticular disease of the colon. Under distended colon versus mild colitis type changes. No pericolonic inflammation. Vascular/Lymphatic: Advanced aortic atherosclerosis. No aneurysm. Mildly enlarged retroperitoneal lymph nodes without significant change. Reproductive: Uterus and bilateral adnexa are unremarkable. Other: Negative for free air. Small volume free fluid within the abdomen and pelvis. Generalized subcutaneous edema consistent with anasarca Musculoskeletal: Posterior lumbar fusion hardware at L3-L4. Chronic deformity at L2. Chronic endplate irregularity at L2-L3. Diffuse skeletal sclerosis likely corresponding to chronic kidney disease history. IMPRESSION: 1. Negative for bowel obstruction or free air. Under distended colon versus mild colitis type changes. Diffuse diverticular disease. 2. Small volume free fluid within the abdomen and pelvis. Generalized subcutaneous edema consistent with anasarca. Small pleural effusions. Cardiomegaly. 3. Aortic atherosclerosis. Aortic Atherosclerosis (ICD10-I70.0). Electronically Signed   By: Jasmine Pang M.D.   On: 01/02/2023 21:46   CT Head Wo Contrast  Result Date: 01/02/2023 CLINICAL DATA:  Mental status change EXAM: CT HEAD WITHOUT CONTRAST TECHNIQUE: Contiguous axial images were obtained from the base of the skull through the vertex without intravenous contrast. RADIATION DOSE REDUCTION: This  exam was performed according to the departmental dose-optimization program which includes automated exposure control, adjustment of the mA and/or kV according to patient size and/or use of iterative reconstruction technique. COMPARISON:  MRI 09/20/2022, CT brain 05/02/2021 FINDINGS: Brain: No acute territorial infarction, hemorrhage or intracranial mass. Mild to moderate white matter hypodensity consistent with chronic small vessel ischemic change. Stable ventricular size Vascular: No hyperdense vessels.  Carotid vascular  calcification. Skull: No fracture. Diffuse sclerosis probably due to history of chronic kidney disease through Sinuses/Orbits: No acute finding. Other: None IMPRESSION: 1. No CT evidence for acute intracranial abnormality. 2. Mild to moderate chronic small vessel ischemic changes of the white matter. Electronically Signed   By: Jasmine Pang M.D.   On: 01/02/2023 21:33   DG Chest 2 View  Result Date: 01/02/2023 CLINICAL DATA:  Chest pain and weakness. EXAM: CHEST - 2 VIEW COMPARISON:  12/19/2022. FINDINGS: There is moderate diffuse pulmonary vascular congestion/edema. No dense consolidation or lung collapse. Bilateral costophrenic angles are clear. No pneumothorax. Stable mildly enlarged cardio-mediastinal silhouette, which is accentuated by low lung volume. There are surgical staples along the heart border and sternotomy wires, status post CABG (coronary artery bypass graft). Prosthetic aortic valve and presumed cardiac monitor noted overlying the left anterior chest wall. No acute osseous abnormalities. Lower cervical spinal fixation hardware seen. The soft tissues are within normal limits. IMPRESSION: *Findings favor congestive heart failure/pulmonary edema. Electronically Signed   By: Jules Schick M.D.   On: 01/02/2023 15:40     Assessment & Plan   Acute on chronic diastolic heart failure initially on BiPAP, currently on [place] 2L/min Severe AS s/p TAVR on 12/23/2022, pending  echo (Valve underexpansion due to severe calcification) New LBBB post-TAVR Myocardial injury likely secondary to volume overload CAD s/p CABG in 2022 Hypotension improved after HD ESRD DD   -Patient had recurrent nausea vomiting and was sent from the dialysis center for hypotension with SBP 80 mmHg and vomiting.  She missed dialysis session due to eye pain a few days ago. Volume overloaded on examination yesterday and 1.5L fluid was removed this morning. Patient is not feeling well, appears to be uncomfortable, sitting up at the edge of the bed, has diffuse Rales bilaterally, no edema in the legs and complains of severe headaches.  Does not make urine, nephrology on board.  Hypotension resolved and her blood pressures are now elevated, 160 mmHg SBP. Will need to restart antihypertensive medications.  Prior echocardiogram after TAVR showed underexpanded valve due to severe calcification and there were no therapeutic options. echocardiogram is pending today. Will have structural team evaluate her on Monday although her current admission is likely secondary to uremia from missed dialysis.  New LBBB after TAVR, no high-grade AV block noted on telemetry. -Has troponin elevation, denies having chest pain, likely secondary to volume overload.   Signed, Marjo Bicker, MD  01/03/2023, 11:56 AM

## 2023-01-03 NOTE — Consult Note (Signed)
Referring Provider: TH Primary Care Physician:  Malka So., MD Primary Gastroenterologist:  Dr. Ewing Schlein   Reason for Consultation:  GI Bleed  HPI: Jodi Bell is a 62 y.o. female with past medical history of recurrent GI bleed in the past requiring multiple endoscopies and workup in the past, end-stage renal disease on dialysis, history of COPD on oxygen supplements, coronary artery disease status post CABG, postop A-fib not on anticoagulation,, CHF, severe aortic stenosis status post TAVR on December 23, 2022 and was discharged on December 25, 2022 presented to the hospital with shortness of breath nausea, vomiting and hypotension.  There was some concern for dark-colored vomiting as well as dark-colored stool and mild drop in hemoglobin.  GI is consulted for further evaluation.  Patient seen and examined at bedside.  She is complaining of severe headache and ongoing shortness of breath and she does not feel like giving detailed history.  She did states she had seen some darker stools recently.  Not sure if there was any blood in the vomiting.  Denies any abdominal pain.  Denies bright red blood per rectum.    EGD at [facility] in May 2024   Z-line is regular at approximately 40 cm from entry. Focal nodular  inflamed mucosa of the GE junction, biopsied for histology. Otherwise  normal esophagus.  Small sliding hiatal hernia. Retained clip from prior endoscopic  intervention in the proximal stomach. Antral gastric polyp measuring 6  mm, removed by cold snare polypectomy, retrieved. Scattered antral  erythema, additional biopsies obtained for H. pylori.  Normal duodenal bulb and second portion.    EGD/colonoscopy 10/02/2020 done for hematochezia and anemia with Dr. Dulce Sellar as an inpatient: Moderate ulceration over internal hemorrhoids suspect because of recent hematochezia, diverticulosis throughout colon, no active or old blood seen EGD showed normal esophagus.  Old hemoclips in  stomach.  Normal duodenum.  No source of bleeding   Patient underwent repeat small bowel enteroscopy 07/22/2021 which showed mid small bowel fresh blood and second area of bleeding in distal small bowel with old blood in colon.  Mild gastritis.   Past Medical History:  Diagnosis Date   Anemia of chronic disease    Anxiety    Arthritis    oa and psoriatic ra   Asthma    Back pain, chronic    lower back   Chronic insomnia    COPD (chronic obstructive pulmonary disease) (HCC)    Diabetes mellitus    type 2   ESRD on dialysis [facility])    Essential hypertension    GERD (gastroesophageal reflux disease)    Gout    History of hiatal hernia    Hypoalbuminemia    Hyponatremia    Mild aortic stenosis    Mitral regurgitation    Mitral stenosis    Obesity    PAD (peripheral artery disease) (HCC)    a. externial iliac calcification seen on CT 04/2020.   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 Lynnette Caffey & Laneta Simmers.   Sleep apnea    Tobacco abuse    Upper GI bleed 05/2019    Past Surgical History:  Procedure Laterality Date   BACK SURGERY  2016   lower back fusion with cage   BIOPSY  09/23/2017   Procedure: BIOPSY;  Surgeon: Graylin Shiver, MD;  Location: [facility] ENDOSCOPY;  Service: Endoscopy;;   BIOPSY  05/19/2019   Procedure: BIOPSY;  Surgeon: Kathi Der, MD;  Location[facility] ENDOSCOPY;  Service: Gastroenterology;;   CESAREAN SECTION  1990   x 1    COLONOSCOPY WITH PROPOFOL N/A 09/23/2017   Procedure: COLONOSCOPY WITH PROPOFOL Hemostatic clips placed;  Surgeon: Graylin Shiver, MD;  Location: [facility] ENDOSCOPY;  Service: Endoscopy;  Laterality: N/A;   COLONOSCOPY WITH PROPOFOL N/A 10/02/2020   Procedure: COLONOSCOPY WITH PROPOFOL;  Surgeon: Willis Modena, MD;  Location: [facility] ENDOSCOPY;  Service: Endoscopy;  Laterality: N/A;   CORONARY ARTERY BYPASS GRAFT N/A 09/13/2020   Procedure: CORONARY ARTERY BYPASS GRAFTING  (CABG), ON PUMP, TIMES TWO, USING LEFT INTERNAL MAMMARY ARTERY AND RIGHT ENDOSCOPICALLY HARVESTED GREATER SAPHENOUS VEIN;  Surgeon: Alleen Borne, MD;  Location: [facility] OR;  Service: Open Heart Surgery;  Laterality: N/A;   DILATION AND CURETTAGE OF UTERUS  1988   ENTEROSCOPY N/A 07/22/2021   Procedure: ENTEROSCOPY;  Surgeon: Kerin Salen, MD;  Location: [facility] ENDOSCOPY;  Service: Gastroenterology;  Laterality: N/A;   ESOPHAGOGASTRODUODENOSCOPY N/A 09/22/2020   Procedure: ESOPHAGOGASTRODUODENOSCOPY (EGD);  Surgeon: Willis Modena, MD;  Location: [facility] ENDOSCOPY;  Service: Endoscopy;  Laterality: N/A;   ESOPHAGOGASTRODUODENOSCOPY (EGD) WITH PROPOFOL N/A 09/23/2017   Procedure: ESOPHAGOGASTRODUODENOSCOPY (EGD) WITH PROPOFOL;  Surgeon: Graylin Shiver, MD;  Location: [facility] ENDOSCOPY;  Service: Endoscopy;  Laterality: N/A;   ESOPHAGOGASTRODUODENOSCOPY (EGD) WITH PROPOFOL N/A 05/19/2019   Procedure: ESOPHAGOGASTRODUODENOSCOPY (EGD) WITH PROPOFOL;  Surgeon: Kathi Der, MD;  Location: [facility] ENDOSCOPY;  Service: Gastroenterology;  Laterality: N/A;   ESOPHAGOGASTRODUODENOSCOPY (EGD) WITH PROPOFOL N/A 10/02/2020   Procedure: ESOPHAGOGASTRODUODENOSCOPY (EGD) WITH PROPOFOL;  Surgeon: Willis Modena, MD;  Location: [facility] ENDOSCOPY;  Service: Endoscopy;  Laterality: N/A;   GIVENS CAPSULE STUDY N/A 05/19/2019   Procedure: GIVENS CAPSULE STUDY;  Surgeon: Kathi Der, MD;  Location: [facility] ENDOSCOPY;  Service: Gastroenterology;  Laterality: N/A;   GIVENS CAPSULE STUDY N/A 07/19/2021   Procedure: GIVENS CAPSULE STUDY;  Surgeon: [name], MD;  Location: [facility] ENDOSCOPY;  Service: Gastroenterology;  Laterality: N/A;   HEMOSTASIS CLIP PLACEMENT  09/22/2020   Procedure: HEMOSTASIS CLIP PLACEMENT;  Surgeon: Willis Modena, MD;  Location: [facility] ENDOSCOPY;  Service: Endoscopy;;   HEMOSTASIS CONTROL  09/22/2020   Procedure: HEMOSTASIS CONTROL;  Surgeon: Willis Modena, MD;  Location: [facility] ENDOSCOPY;  Service: Endoscopy;;   HOT HEMOSTASIS N/A  05/19/2019   Procedure: HOT HEMOSTASIS (ARGON PLASMA COAGULATION/BICAP);  Surgeon: Kathi Der, MD;  Location: [facility] ENDOSCOPY;  Service: Gastroenterology;  Laterality: N/A;   INTRAOPERATIVE TRANSTHORACIC ECHOCARDIOGRAM N/A 12/23/2022   Procedure: INTRAOPERATIVE TRANSTHORACIC ECHOCARDIOGRAM;  Surgeon: [name], MD;  Location: [facility] INVASIVE CV LAB;  Service: Cardiovascular;  Laterality: N/A;   LEFT HEART CATH AND CORONARY ANGIOGRAPHY N/A 09/10/2020   Procedure: LEFT HEART CATH AND CORONARY ANGIOGRAPHY;  Surgeon: Yvonne Kendall, MD;  Location: [facility] INVASIVE CV LAB;  Service: Cardiovascular;  Laterality: N/A;   PARTIAL KNEE ARTHROPLASTY Left 11/12/2017   Procedure: left unicompartmental arthroplasty-medial;  Surgeon: Durene Romans, MD;  Location: [facility] ORS;  Service: Orthopedics;  Laterality: Left;    RIGHT HEART CATH AND CORONARY/GRAFT ANGIOGRAPHY N/A 06/26/2022   Procedure: RIGHT HEART CATH AND CORONARY/GRAFT ANGIOGRAPHY;  Surgeon: [name], MD;  Location: [facility] INVASIVE CV LAB;  Service: Cardiovascular;  Laterality: N/A;   SUBMUCOSAL INJECTION  09/23/2017   Procedure: SUBMUCOSAL INJECTION;  Surgeon: Graylin Shiver, MD;  Location: [facility] ENDOSCOPY;  Service: Endoscopy;;  in colon   TEE WITHOUT CARDIOVERSION N/A 09/13/2020   Procedure: TRANSESOPHAGEAL ECHOCARDIOGRAM (TEE);  Surgeon: Alleen Borne, MD;  Location: [facility] OR;  Service: Open Heart Surgery;  Laterality: N/A;   TUBAL LIGATION  1990    Prior to Admission medications   Medication Sig Start Date End Date Taking? Authorizing Provider  acetaminophen (TYLENOL) 500 MG tablet Take 1,000 mg by mouth daily as needed (pain).    [provider]  albuterol (PROVENTIL) (2.5 MG/3ML) 0.083% nebulizer solution Take 3 mLs (2.5 mg total) by nebulization every 6 (six) hours as needed for wheezing or shortness of breath. 07/10/21   Hunsucker, Lesia Sago, MD  allopurinol (ZYLOPRIM) 100 MG tablet Take 100 mg by mouth every evening.    [provider]  amoxicillin (AMOXIL) 500 MG tablet Take 4 tablets by mouth 1 hour prior to dental procedures and cleanings. 12/29/22   Georgie Chard D, NP  Artificial Tear Solution (TEARS NATURALE OP) Place 1 drop into both eyes as needed (dry eye).    [provider]  aspirin 81 MG chewable tablet Chew 1 tablet (81 mg total) by mouth daily. 12/25/22   Janetta Hora, PA-C  busPIRone (BUSPAR) 5 MG tablet Take 5 mg by mouth 3 (three) times daily. 01/01/23 01/01/24  [provider]  carboxymethylcellulose 1 % ophthalmic solution Place 1 drop into both eyes 3 (three) times daily.    [provider]  carvedilol (COREG) 25 MG tablet Take 1 tablet (25 mg total) by mouth 2 (two) times daily with a meal. 12/25/22   Janetta Hora, PA-C  cetirizine (ZYRTEC) 10 MG tablet Take 10 mg by mouth daily as needed for allergies.    [provider]  Cholecalciferol (VITAMIN D) 50 MCG (2000 UT) tablet Take 2,000 Units by mouth daily.    [provider]  cinacalcet (SENSIPAR) 30 MG tablet Take 60 mg by mouth every Monday, Wednesday, and Friday with hemodialysis.    [provider]  cloNIDine (CATAPRES - DOSED IN MG/24 HR) 0.3 mg/24hr patch Place 0.3 mg onto the skin every Sunday.    [provider]  diclofenac Sodium (VOLTAREN) 1 % GEL Apply 2 g topically daily as needed (pain).    [provider]  docusate sodium (COLACE) 100 MG capsule Take 100 mg by mouth daily.    [provider]  EPINEPHrine 0.3 mg/0.3 mL IJ SOAJ injection Inject into the muscle as needed for anaphylaxis. 07/12/21   [provider]  fluticasone (FLONASE) 50 MCG/ACT nasal spray Place 1 spray into both nostrils daily as needed for allergies or rhinitis.    [provider]  hydrALAZINE (APRESOLINE) 100 MG tablet Take 1 tablet (100 mg total) by mouth every 8 (eight) hours. 07/06/20   Cresenzo, Cyndi Lennert, MD  hydrocortisone (ANUSOL-HC) 2.5 % rectal  cream Place rectally 2 (two) times daily. 07/23/21   Park Pope, MD  isosorbide mononitrate (IMDUR) 60 MG 24 hr tablet Take 1 tablet (60 mg total) by mouth daily. 10/27/22   Jake Bathe, MD  LORazepam (ATIVAN) 0.5 MG tablet Take 0.5 mg by mouth every 8 (eight) hours as needed for anxiety.    [provider]  montelukast (SINGULAIR) 10 MG tablet Take 10 mg by mouth daily with supper.     [provider]  multivitamin (RENA-VIT) TABS tablet Take 1 tablet by mouth every Monday, Wednesday, and Friday.    [provider]  neomycin-polymyxin b-dexamethasone (MAXITROL) 3.5-10000-0.1 SUSP Place 1 drop into both eyes in the morning, at noon, in the evening, and at bedtime. 12/18/22   [provider]  NIFEdipine (PROCARDIA XL/NIFEDICAL XL)  60 MG 24 hr tablet Take 60 mg by mouth 2 (two) times daily. 10/01/21   [provider]  olmesartan (BENICAR) 40 MG tablet Take 40 mg by mouth every evening. 05/06/19   [provider]  ondansetron (ZOFRAN) 4 MG tablet Take 4 mg by mouth 2 (two) times daily as needed for nausea or vomiting.    [provider]  OXYGEN Inhale 2-3 L into the lungs continuous.    [provider]  pantoprazole (PROTONIX) 40 MG tablet Take 40 mg by mouth 2 (two) times daily. 06/09/20   [provider]  predniSONE (DELTASONE) 5 MG tablet Take 1 tablet (5 mg total) by mouth daily with breakfast. 02/27/22   Marrianne Mood, MD  Probiotic Product (PROBIOTIC PO) Take 1 capsule by mouth daily.    [provider]  silver sulfADIAZINE (SILVADENE) 1 % cream Apply 1 Application topically daily as needed (wound care).    [provider]  Tiotropium Bromide-Olodaterol (STIOLTO RESPIMAT) 2.5-2.5 MCG/ACT AERS Inhale 2 puffs once daily into the lungs. 11/28/21   Hunsucker, Lesia Sago, MD  VENTOLIN HFA 108 (90 Base) MCG/ACT inhaler Inhale 2 puffs into the lungs See admin instructions. Take 2 puffs every 4-6 hours as  needed for wheezing and shortness of breath 04/08/22   Hunsucker, Lesia Sago, MD  White Petrolatum-Mineral Oil (SOOTHE NIGHTTIME) OINT Place 1 Application into both eyes at bedtime.    [provider]  zolpidem (AMBIEN) 5 MG tablet Take 5 mg by mouth at bedtime as needed for sleep. 02/04/21   [provider]    Scheduled Meds:  arformoterol  15 mcg Nebulization BID   budesonide (PULMICORT) nebulizer solution  0.25 mg Nebulization BID   carvedilol  25 mg Oral BID WC   Chlorhexidine Gluconate Cloth  6 each Topical Q0600   isosorbide mononitrate  60 mg Oral Daily   pantoprazole (PROTONIX) IV  40 mg Intravenous Q12H   predniSONE  5 mg Oral Q breakfast   revefenacin  175 mcg Nebulization Daily   Continuous Infusions: PRN Meds:.acetaminophen, ipratropium-albuterol, LORazepam, ondansetron (ZOFRAN) IV **OR** ondansetron  Allergies as of 01/02/2023 - Reviewed 01/02/2023  Allergen Reaction Noted   3-methyl-2-benzothiazolinone hydrazone Other (See Comments) 09/07/2012   Banana Anaphylaxis, Swelling, and Other (See Comments) 01/21/2018   Black walnut flavor Anaphylaxis and Itching 05/18/2019   Hazelnut (filbert) Anaphylaxis 05/18/2019   Leflunomide and related Other (See Comments) 07/23/2012   Lisinopril Swelling 01/05/2014   No healthtouch food allergies Anaphylaxis 05/18/2019   Other Anaphylaxis, Swelling, and Other (See Comments) 09/07/2012   Pecan nut (diagnostic) Anaphylaxis and Itching 05/18/2019   Trazodone and nefazodone Other (See Comments) 07/23/2014   Adalimumab Other (See Comments) 07/23/2012   Escitalopram oxalate Other (See Comments) 09/29/2013   Ezetimibe Other (See Comments) 01/13/2018   Secukinumab Swelling 05/18/2019   Statins Other (See Comments) 09/04/2014   Ferrlecit [na ferric gluc cplx in sucrose]  09/19/2020   Gabapentin Other (See Comments) 06/26/2021   Nsaids  06/24/2022    Family History  Problem Relation Age of Onset   Lung cancer Mother     Diabetes Sister    Heart disease Sister    Asthma Daughter    Arthritis Daughter    Arthritis Daughter    Thyroid cancer Other        1/2 sister   Heart disease Other        1/2 sister    Social History   Socioeconomic History   Marital status: Divorced  Spouse name: BASIL   Number of children: 3   Years of education: 1   Highest education level: GED or equivalent  Occupational History   Occupation: retired Public house manager  Tobacco Use   Smoking status: Former    Current packs/day: 0.00    Average packs/day: 0.5 packs/day for 40.0 years (20.0 ttl pk-yrs)    Types: Cigarettes    Start date: 08/1980    Quit date: 08/2020    Years since quitting: 2.4   Smokeless tobacco: Never  Vaping Use   Vaping status: Never Used  Substance and Sexual Activity   Alcohol use: Yes    Comment: occ   Drug use: No   Sexual activity: Not on file  Other Topics Concern   Not on file  Social History Narrative   Not on file   Social Determinants of Health   Financial Resource Strain: Low Risk  (06/12/2022)   Received from [facility], [facility]   Overall Financial Resource Strain (CARDIA)    Difficulty of Paying Living Expenses: Not hard at all  Recent Concern: Financial Resource Strain - Medium Risk (04/29/2022)   Received from [facility]   Overall Financial Resource Strain (CARDIA)    Difficulty of Paying Living Expenses: Somewhat hard  Food Insecurity: No Food Insecurity (01/02/2023)   Hunger Vital Sign    Worried About Running Out of Food in the Last Year: Never true    Ran Out of Food in the Last Year: Never true  Transportation Needs: No Transportation Needs (01/02/2023)   PRAPARE - Administrator, Civil Service (Medical): No    Lack of Transportation (Non-Medical): No  Physical Activity: Inactive (09/15/2018)   Exercise Vital Sign    Days of Exercise per Week: 0 days    Minutes of Exercise per Session: 0 min  Stress: Stress Concern Present (07/02/2022)   Received from  [facility], [facility] - Occupational Stress Questionnaire    Feeling of Stress : To some extent  Social Connections: Unknown (06/23/2021)   Received from [facility], [facility]   Social Network    Social Network: Not on file  Intimate Partner Violence: Not At Risk (01/02/2023)   Humiliation, Afraid, Rape, and Kick questionnaire    Fear of Current or Ex-Partner: No    Emotionally Abused: No    Physically Abused: No    Sexually Abused: No    Review of Systems: All negative except as stated above in HPI.  Physical Exam: Vital signs: Vitals:   01/03/23 0636 01/03/23 0831  BP: (!) 163/60   Pulse: 73 73  Resp: (!) 23   Temp: 97.6 F (36.4 C)   SpO2: 100% 98%     General: In mild distress from shortness of breath and headache, oxygen by nasal cannula Lungs: Decreased breath sounds bilaterally, Heart:  Regular rate and rhythm; no murmurs, clicks, rubs,  or gallops. Abdomen: Soft, nontender, nondistended, bowel sound present, no peritoneal signs Rectal:  Deferred  GI:  Lab Results: Recent Labs    01/02/23 1514 01/02/23 1600 01/02/23 2045 01/03/23 0745  WBC 6.0  --   --  5.0  HGB 8.2* 9.5* 8.8* 8.2*  HCT 28.8* 28.0* 26.0* 27.2*  PLT 121*  --   --  100*   BMET Recent Labs    01/02/23 1514 01/02/23 1600 01/02/23 1811 01/02/23 2045 01/02/23 2101 01/03/23 0745  NA 133*   < > 131* 132*  --  133*  K 4.3   < > 6.2* 6.4* 5.6* 3.1*  CL 101  --  100  --   --  96*  CO2 24  --  22  --   --  25  GLUCOSE 92  --  90  --   --  61*  BUN 50*  --  51*  --   --  22  CREATININE 8.17*  --  8.19*  --   --  4.55*  CALCIUM 8.7*  --  8.3*  --   --  8.3*   < > = values in this interval not displayed.   LFT Recent Labs    01/02/23 1811  PROT 5.0*  ALBUMIN 2.4*  2.4*  AST 22  ALT 11  ALKPHOS 48  BILITOT 1.2*  BILIDIR 0.6*  IBILI 0.6   PT/INR Recent Labs    01/03/23 0745  LABPROT 16.3*  INR 1.3*      Studies/Results: CT ABDOMEN PELVIS WO CONTRAST  Result Date: 01/02/2023 CLINICAL DATA:  Abdomen pain nausea vomiting hypotension dialysis patient EXAM: CT ABDOMEN AND PELVIS WITHOUT CONTRAST TECHNIQUE: Multidetector CT imaging of the abdomen and pelvis was performed following the standard protocol without IV contrast. RADIATION DOSE REDUCTION: This exam was performed according to the departmental dose-optimization program which includes automated exposure control, adjustment of the mA and/or kV according to patient size and/or use of iterative reconstruction technique. COMPARISON:  CT 09/19/2022 FINDINGS: Lower chest: Lung bases demonstrate small pleural effusions and cardiomegaly. No acute airspace disease. Coronary vascular calcifications. Aortic valve prosthesis. Hepatobiliary: No calcified gallstone or biliary dilatation. No focal hepatic abnormality. Pancreas: Unremarkable. No pancreatic ductal dilatation or surrounding inflammatory changes. Spleen: Normal in size without focal abnormality. Adrenals/Urinary Tract: Adrenal glands are normal. Kidneys show no hydronephrosis. Extensive renal vascular calcifications. Probable renal cysts for which no imaging follow-up is recommended. Decompressed urinary bladder Stomach/Bowel: Stomach nonenlarged. There is no dilated small bowel. Diffuse diverticular disease of the colon. Under distended colon versus mild colitis type changes. No pericolonic inflammation. Vascular/Lymphatic: Advanced aortic atherosclerosis. No aneurysm. Mildly enlarged retroperitoneal lymph nodes without significant change. Reproductive: Uterus and bilateral adnexa are unremarkable. Other: Negative for free air. Small volume free fluid within the abdomen and pelvis. Generalized subcutaneous edema consistent with anasarca Musculoskeletal: Posterior lumbar fusion hardware at L3-L4. Chronic deformity at L2. Chronic endplate irregularity at L2-L3. Diffuse skeletal sclerosis likely  corresponding to chronic kidney disease history. IMPRESSION: 1. Negative for bowel obstruction or free air. Under distended colon versus mild colitis type changes. Diffuse diverticular disease. 2. Small volume free fluid within the abdomen and pelvis. Generalized subcutaneous edema consistent with anasarca. Small pleural effusions. Cardiomegaly. 3. Aortic atherosclerosis. Aortic Atherosclerosis (ICD10-I70.0). Electronically Signed   By: Jasmine Pang M.D.   On: 01/02/2023 21:46   CT Head Wo Contrast  Result Date: 01/02/2023 CLINICAL DATA:  Mental status change EXAM: CT HEAD WITHOUT CONTRAST TECHNIQUE: Contiguous axial images were obtained from the base of the skull through the vertex without intravenous contrast. RADIATION DOSE REDUCTION: This exam was performed according to the departmental dose-optimization program which includes automated exposure control, adjustment of the mA and/or kV according to patient size and/or use of iterative reconstruction technique. COMPARISON:  MRI 09/20/2022, CT brain 05/02/2021 FINDINGS: Brain: No acute territorial infarction, hemorrhage or intracranial mass. Mild to moderate white matter hypodensity consistent with chronic small vessel ischemic change. Stable ventricular size Vascular: No hyperdense vessels.  Carotid vascular calcification. Skull: No fracture. Diffuse sclerosis probably due to history  of chronic kidney disease through Sinuses/Orbits: No acute finding. Other: None IMPRESSION: 1. No CT evidence for acute intracranial abnormality. 2. Mild to moderate chronic small vessel ischemic changes of the white matter. Electronically Signed   By: Jasmine Pang M.D.   On: 01/02/2023 21:33   DG Chest 2 View  Result Date: 01/02/2023 CLINICAL DATA:  Chest pain and weakness. EXAM: CHEST - 2 VIEW COMPARISON:  12/19/2022. FINDINGS: There is moderate diffuse pulmonary vascular congestion/edema. No dense consolidation or lung collapse. Bilateral costophrenic angles are clear.  No pneumothorax. Stable mildly enlarged cardio-mediastinal silhouette, which is accentuated by low lung volume. There are surgical staples along the heart border and sternotomy wires, status post CABG (coronary artery bypass graft). Prosthetic aortic valve and presumed cardiac monitor noted overlying the left anterior chest wall. No acute osseous abnormalities. Lower cervical spinal fixation hardware seen. The soft tissues are within normal limits. IMPRESSION: *Findings favor congestive heart failure/pulmonary edema. Electronically Signed   By: Jules Schick M.D.   On: 01/02/2023 15:40    Impression/Plan: -Suspected upper GI bleed with dark stool and questionable dark material in the vomiting in a patient with known history of recurrent GI bleed.  Had EGD in May 2024 at [facility] which again showed inflamed mucosa in the GE junction and gastritis. -Acute on chronic anemia -Status post TAVR few days ago -Hypoxemic respiratory failure. -Acute on chronic COPD exacerbation  Recommendations ------------------------ -Patient with known history of esophagitis and gastritis.  Multiple endoscopies in the past.  She is status post TAVR few days ago and currently having some respiratory issues. -Recommend conservative management with IV twice daily PPI for now. -Recommend to continue IV twice daily PPI while in the hospital and discharged home on Protonix 40 mg twice a day. -No endoscopic workup planned at this time.  Okay to advance diet as tolerated from GI standpoint.  GI will sign off.  Call us back if needed.    LOS: 1 day   Kathi Der  MD, FACP 01/03/2023, 9:13 AM  Contact #  [phone]

## 2023-01-03 NOTE — Progress Notes (Addendum)
Subjective:   Summary: Jodi Bell is an 62 y.o. female with past medical history significant for hypertension, DM, HLD, gout, CAD status post CABG, CHF, severe aortic stenosis status post recent TAVR on 12/23/2022, COPD, ESRD on HD MWF, presents today because of vomiting and hypotension from the dialysis.    Hospital Day 1  Overnight Events: None  Subjective:  Saw patient at bedside this am. Patient was very drowsy, required stimulation to remain awake and engaged in conversation. She responded slowly but appropriately to orientation questions. She is feeling a little better after completing dialysis this morning, her main complaint is pain of her eyes, neck, and a headache. Unclear if this is a new problem for her, though she does have eye drops listed on her home medication list. Also notes having neck surgery recently. She is also nauseous though denies vomiting since yesterday.   Objective:  Vital signs in last 24 hours: Vitals:   01/03/23 0600 01/03/23 0623 01/03/23 0636 01/03/23 0831  BP: (!) 125/56 (!) 143/69 (!) 163/60   Pulse: 74 74 73 73  Resp: 20 (!) 26 (!) 23   Temp:   97.6 F (36.4 C)   TempSrc:   Oral   SpO2: 100% 100% 100% 98%  Weight:  71.2 kg    Height:       Supplemental O2: Nasal Cannula SpO2: 98 % O2 Flow Rate (L/min): 2 L/min FiO2 (%): 32 %   Physical Exam:  Constitutional: ill-appearing Cardiovascular: RRR, no murmurs, rubs or gallops Pulmonary/Chest: normal work of breathing on room air, lungs clear to auscultation bilaterally Abdominal: soft, non-tender, non-distended Neuro: 5/5 strength, no focal deficit noted Rectal: No skin tags or obvious hemorrhoids. Brown stool, no blood. No noticeable intrarectal abnormalities Skin: warm and dry Extremities: upper/lower extremity pulses 2+, no lower extremity edema present   Intake/Output Summary (Last 24 hours) at 01/03/2023 0911 Last data filed at 01/03/2023 0636 Gross per 24  hour  Intake 100 ml  Output 1501 ml  Net -1401 ml   Net IO Since Admission: -1,401 mL [01/03/23 0911]  Pertinent Labs:    Latest Ref Rng & Units 01/03/2023    7:45 AM 01/02/2023    8:45 PM 01/02/2023    4:00 PM  CBC  WBC 4.0 - 10.5 K/uL 5.0     Hemoglobin 12.0 - 15.0 g/dL 8.2  8.8  9.5   Hematocrit 36.0 - 46.0 % 27.2  26.0  28.0   Platelets 150 - 400 K/uL 100          Latest Ref Rng & Units 01/02/2023    9:01 PM 01/02/2023    8:45 PM 01/02/2023    6:11 PM  CMP  Glucose 70 - 99 mg/dL   90   BUN 8 - 23 mg/dL   51   Creatinine 4.09 - 1.00 mg/dL   8.11   Sodium 914 - 782 mmol/L  132  131   Potassium 3.5 - 5.1 mmol/L 5.6  6.4  6.2   Chloride 98 - 111 mmol/L   100   CO2 22 - 32 mmol/L   22   Calcium 8.9 - 10.3 mg/dL   8.3   Total Protein 6.5 - 8.1 g/dL   5.0   Total Bilirubin <1.2 mg/dL   1.2   Alkaline Phos 38 - 126 U/L   48   AST 15 - 41 U/L  22   ALT 0 - 44 U/L   11     Imaging: CT ABDOMEN PELVIS WO CONTRAST  Result Date: 01/02/2023 CLINICAL DATA:  Abdomen pain nausea vomiting hypotension dialysis patient EXAM: CT ABDOMEN AND PELVIS WITHOUT CONTRAST TECHNIQUE: Multidetector CT imaging of the abdomen and pelvis was performed following the standard protocol without IV contrast. RADIATION DOSE REDUCTION: This exam was performed according to the departmental dose-optimization program which includes automated exposure control, adjustment of the mA and/or kV according to patient size and/or use of iterative reconstruction technique. COMPARISON:  CT 09/19/2022 FINDINGS: Lower chest: Lung bases demonstrate small pleural effusions and cardiomegaly. No acute airspace disease. Coronary vascular calcifications. Aortic valve prosthesis. Hepatobiliary: No calcified gallstone or biliary dilatation. No focal hepatic abnormality. Pancreas: Unremarkable. No pancreatic ductal dilatation or surrounding inflammatory changes. Spleen: Normal in size without focal abnormality. Adrenals/Urinary  Tract: Adrenal glands are normal. Kidneys show no hydronephrosis. Extensive renal vascular calcifications. Probable renal cysts for which no imaging follow-up is recommended. Decompressed urinary bladder Stomach/Bowel: Stomach nonenlarged. There is no dilated small bowel. Diffuse diverticular disease of the colon. Under distended colon versus mild colitis type changes. No pericolonic inflammation. Vascular/Lymphatic: Advanced aortic atherosclerosis. No aneurysm. Mildly enlarged retroperitoneal lymph nodes without significant change. Reproductive: Uterus and bilateral adnexa are unremarkable. Other: Negative for free air. Small volume free fluid within the abdomen and pelvis. Generalized subcutaneous edema consistent with anasarca Musculoskeletal: Posterior lumbar fusion hardware at L3-L4. Chronic deformity at L2. Chronic endplate irregularity at L2-L3. Diffuse skeletal sclerosis likely corresponding to chronic kidney disease history. IMPRESSION: 1. Negative for bowel obstruction or free air. Under distended colon versus mild colitis type changes. Diffuse diverticular disease. 2. Small volume free fluid within the abdomen and pelvis. Generalized subcutaneous edema consistent with anasarca. Small pleural effusions. Cardiomegaly. 3. Aortic atherosclerosis. Aortic Atherosclerosis (ICD10-I70.0). Electronically Signed   By: Jasmine Pang M.D.   On: 01/02/2023 21:46   CT Head Wo Contrast  Result Date: 01/02/2023 CLINICAL DATA:  Mental status change EXAM: CT HEAD WITHOUT CONTRAST TECHNIQUE: Contiguous axial images were obtained from the base of the skull through the vertex without intravenous contrast. RADIATION DOSE REDUCTION: This exam was performed according to the departmental dose-optimization program which includes automated exposure control, adjustment of the mA and/or kV according to patient size and/or use of iterative reconstruction technique. COMPARISON:  MRI 09/20/2022, CT brain 05/02/2021 FINDINGS: Brain:  No acute territorial infarction, hemorrhage or intracranial mass. Mild to moderate white matter hypodensity consistent with chronic small vessel ischemic change. Stable ventricular size Vascular: No hyperdense vessels.  Carotid vascular calcification. Skull: No fracture. Diffuse sclerosis probably due to history of chronic kidney disease through Sinuses/Orbits: No acute finding. Other: None IMPRESSION: 1. No CT evidence for acute intracranial abnormality. 2. Mild to moderate chronic small vessel ischemic changes of the white matter. Electronically Signed   By: Jasmine Pang M.D.   On: 01/02/2023 21:33   DG Chest 2 View  Result Date: 01/02/2023 CLINICAL DATA:  Chest pain and weakness. EXAM: CHEST - 2 VIEW COMPARISON:  12/19/2022. FINDINGS: There is moderate diffuse pulmonary vascular congestion/edema. No dense consolidation or lung collapse. Bilateral costophrenic angles are clear. No pneumothorax. Stable mildly enlarged cardio-mediastinal silhouette, which is accentuated by low lung volume. There are surgical staples along the heart border and sternotomy wires, status post CABG (coronary artery bypass graft). Prosthetic aortic valve and presumed cardiac monitor noted overlying the left anterior chest wall. No acute osseous abnormalities. Lower cervical spinal fixation hardware seen. The  soft tissues are within normal limits. IMPRESSION: *Findings favor congestive heart failure/pulmonary edema. Electronically Signed   By: Jules Schick M.D.   On: 01/02/2023 15:40    Assessment/Plan:   Principal Problem:   GI bleed Active Problems:   Essential hypertension   COPD (chronic obstructive pulmonary disease) (HCC)   Acute anemia   ESRD (end stage renal disease) (HCC)   Chronic heart failure (HCC)   Demand ischemia (HCC)   Thrombocytopenia (HCC)   Respiratory failure with hypoxia and hypercapnia (HCC)   #GI bleeding #Acute on chronic anemia Patient has a history of GI bleeding due to peptic ulcer  disease and Dieulafoy lesion, reporting at least one day of dark stools. She was started on daily ASA after recent TAVR on 12/23/22. Currently hemodynamically stable with Hgb 8.2 from baseline ~10. Rectal exam was unremarkable with no gross blood.  -GI consulted, recommending IV PPI followed by oral PPI after discharge -Holding anticoagulation and antiplatelet agents -Iron panel pending  #ESRD #Hyperkalemia Patient usually attends HD on M/W/F, missed her Wednesday session because she wasn't feeling well. She attending dialysis on Friday but was only able to tolerate less than half of the session. Upon arrival to the ED potassium was elevated to 7.2, likely hemolyzed. EKG was unremarkable.  -Dialysis this morning with 1.5L removed, nephrology following for HD needs -Potassium improved to 3.1 after HD  #Demand ischemia #CAD Patient has a history of CAD s/p CABG x2 with LIMA to LAD and SVG to Diag in 2022. Initial troponin elevated at 252. Not having chest pain. Troponin elevation likely d/t demand ischemia in setting of ESRD and volume overload and hypotension -Cardiology consulted, appreciate recs -Holding home Imdur and coreg due to hypotension  #Chronic HFpEF Last echo from 12/24/22 show LVEF of 65% with severe LVH and moderate RV dysfunction. Volume status is managed by dialysis. She has not had a full dialysis session since 12/29/2022 -s/p dialysis this morning. Patient appears euvolemic and breathing at her baseline O2 requirement today  #AMS Patient was lethargic and poorly answers questions. Unclear why or what her baseline mental status is. Low suspicion for sepsis with no leukocytosis, but she has been feeling poorly and vomiting for the past couple days.  -Blood cultures pending -Continue to monitor for changes in mental status  #COPD -Home 2L O2 [place] -Triple therapy LAMA/LABA, ICS via neg, q6h duonebs prn  #Thrombocytopenia Long history of thrombocytopenia and fatty liver. Low  suspicion for DIC or TTP. Platelet count 100k at admission -Smear for anemia and thrombocytopenia -INR  #Hypertension Normotensive to hypotensive since arrival in ED. Extensive antihypertensive regimen at home, restart selectively due to concomitant demand ischemia.   Diet: Renal diet VTE: SCDs Code: Full  Dispo: Anticipated discharge to Home pending medical stability.   Monna Fam, MD PGY-1 Internal Medicine Resident Pager Number [phone] Please contact the on call pager after 5 pm and on weekends at [phone].

## 2023-01-03 NOTE — Progress Notes (Addendum)
[facility] Progress Note  Subjective: went from bipap to 2 L [place].  1.5 L removed w/ HD overnight.   Vitals:   01/03/23 0600 01/03/23 0623 01/03/23 0636 01/03/23 0831  BP: (!) 125/56 (!) 143/69 (!) 163/60   Pulse: 74 74 73 73  Resp: 20 (!) 26 (!) 23   Temp:   97.6 F (36.4 C)   TempSrc:   Oral   SpO2: 100% 100% 100% 98%  Weight:  71.2 kg    Height:        Exam:  Pt looks anxious, a bit agitated, on 2L O2 Respiratory: Clear bilateral, no wheezing or crackle Cardiovascular: Regular rate rhythm S1-S2 normal, no rubs GI: Abdomen soft, nontender, nondistended Extremities, no cyanosis or clubbing, no edema Skin: No rash or ulcer Neurology: restless as above Dialysis Access: AV fistula +bruit .      OP HD: [facility] MWF [address]  from 12/25/22 (needs updating) --> 3.5h  58.5kg  400/600  2K/3Ca bath  Hep none  L AVF   CXR 11/22 - bilat pulm edema   1.5 L off w/ HD last night    Standing weight today was 58.7kg, which is close to her dry wt    Assessment/Plan:   # Acute on chronic hypoxic/hypercapnic respiratory failure - with pulmonary edema on x-ray. On BiPAP last night. Had HD overnight w/ 1.5 L off. On 2 L [place]. Is very close to her dry wt today. Follow.    # ESRD: MWF HD w/ [facility]. Has missed dialysis. Had HD last night as above. Next HD Sunday (holiday schedule).    # Hypertension/volume: She is supposed to be on multiple antihypertensives.  BP's were soft but now are normal.     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   # CKD-metabolic Bone Disease: Monitor calcium/phosphorus level.

## 2023-01-03 NOTE — Progress Notes (Signed)
Received patient in bed to unit.  Alert and oriented.  Informed consent signed and in chart.   TX duration:3 hours  Patient tolerated well.  Transported back to the room  Alert, without acute distress.  Hand-off given to patient's nurse.   Access used: AVF Access issues: none  Total UF removed: 1500 Medication(s) given: none Post HD VS: see table below Post HD weight: 71.2kg     01/03/23 0636  Vitals  Temp 97.6 F (36.4 C)  Temp Source Oral  BP (!) 163/60  MAP (mmHg) 91  BP Location Right Arm  BP Method Automatic  Patient Position (if appropriate) Lying  Pulse Rate 73  Pulse Rate Source Monitor  ECG Heart Rate 74  Resp (!) 23  Oxygen Therapy  SpO2 100 %  O2 Device Nasal Cannula  O2 Flow Rate (L/min) 2 L/min  Patient Activity (if Appropriate) In bed  Pulse Oximetry Type Continuous  During Treatment Monitoring  Blood Flow Rate (mL/min) 0 mL/min  Arterial Pressure (mmHg) 1.01 mmHg  Venous Pressure (mmHg) -2.42 mmHg  TMP (mmHg) -51.31 mmHg  Ultrafiltration Rate (mL/min) 679 mL/min  Dialysate Flow Rate (mL/min) 300 ml/min  Dialysate Potassium Concentration 2  Dialysate Calcium Concentration 2.5  Duration of HD Treatment -hour(s) 3 hour(s)  Cumulative Fluid Removed (mL) per Treatment  1500.2  HD Safety Checks Performed Yes  Intra-Hemodialysis Comments Tolerated well  Post Treatment  Dialyzer Clearance Clear  Hemodialysis Intake (mL) 0 mL  Liters Processed 75  Fluid Removed (mL) 1500 mL  Tolerated HD Treatment Yes  AVG/AVF Arterial Site Held (minutes) 8 minutes  AVG/AVF Venous Site Held (minutes) 8 minutes  Fistula / Graft Left Upper arm  Placement Date/Time: 05/17/18 0800   Orientation: Left  Access Location: Upper arm  Site Condition No complications  Fistula / Graft Assessment Present;Thrill;Bruit;Aneurysm present  Status Flushed;Patent;Deaccessed      Par[facility]e C[facility]

## 2023-01-04 ENCOUNTER — Telehealth: Payer: Self-pay | Admitting: Physician Assistant

## 2023-01-04 ENCOUNTER — Inpatient Hospital Stay (HOSPITAL_COMMUNITY): Payer: Medicare PPO

## 2023-01-04 ENCOUNTER — Other Ambulatory Visit: Payer: Self-pay

## 2023-01-04 DIAGNOSIS — K922 Gastrointestinal hemorrhage, unspecified: Secondary | ICD-10-CM | POA: Diagnosis not present

## 2023-01-04 DIAGNOSIS — Z992 Dependence on renal dialysis: Secondary | ICD-10-CM | POA: Diagnosis not present

## 2023-01-04 DIAGNOSIS — I5033 Acute on chronic diastolic (congestive) heart failure: Secondary | ICD-10-CM | POA: Diagnosis not present

## 2023-01-04 DIAGNOSIS — I5A Non-ischemic myocardial injury (non-traumatic): Secondary | ICD-10-CM

## 2023-01-04 DIAGNOSIS — R9431 Abnormal electrocardiogram [ECG] [EKG]: Secondary | ICD-10-CM

## 2023-01-04 DIAGNOSIS — I50810 Right heart failure, unspecified: Secondary | ICD-10-CM

## 2023-01-04 DIAGNOSIS — N186 End stage renal disease: Secondary | ICD-10-CM | POA: Diagnosis not present

## 2023-01-04 DIAGNOSIS — I25709 Atherosclerosis of coronary artery bypass graft(s), unspecified, with unspecified angina pectoris: Secondary | ICD-10-CM

## 2023-01-04 LAB — BASIC METABOLIC PANEL
Anion gap: 11 (ref 5–15)
BUN: 31 mg/dL — ABNORMAL HIGH (ref 8–23)
CO2: 28 mmol/L (ref 22–32)
Calcium: 9.1 mg/dL (ref 8.9–10.3)
Chloride: 94 mmol/L — ABNORMAL LOW (ref 98–111)
Creatinine, Ser: 6.19 mg/dL — ABNORMAL HIGH (ref 0.44–1.00)
GFR, Estimated: 7 mL/min — ABNORMAL LOW (ref >60)
Glucose, Bld: 97 mg/dL (ref 70–99)
Potassium: 4.2 mmol/L (ref 3.5–5.1)
Sodium: 133 mmol/L — ABNORMAL LOW (ref 135–145)

## 2023-01-04 LAB — CBC
HCT: 25.8 % — ABNORMAL LOW (ref 36.0–46.0)
Hemoglobin: 7.6 g/dL — ABNORMAL LOW (ref 12.0–15.0)
MCH: 31.7 pg (ref 26.0–34.0)
MCHC: 29.5 g/dL — ABNORMAL LOW (ref 30.0–36.0)
MCV: 107.5 fL — ABNORMAL HIGH (ref 80.0–100.0)
Platelets: 107 10*3/uL — ABNORMAL LOW (ref 150–400)
RBC: 2.4 MIL/uL — ABNORMAL LOW (ref 3.87–5.11)
RDW: 15.9 % — ABNORMAL HIGH (ref 11.5–15.5)
WBC: 5.5 10*3/uL (ref 4.0–10.5)
nRBC: 0.4 % — ABNORMAL HIGH (ref 0.0–0.2)

## 2023-01-04 LAB — ECHOCARDIOGRAM COMPLETE
AR max vel: 0.91 cm2
AV Area VTI: 1.07 cm2
AV Area mean vel: 0.98 cm2
AV Mean grad: 21.5 mm[Hg]
AV Peak grad: 50.7 mm[Hg]
Ao pk vel: 3.56 m/s
Area-P 1/2: 3.48 cm2
Calc EF: 64.4 %
Height: 61 in
MV M vel: 5.16 m/s
MV Peak grad: 106.5 mm[Hg]
MV VTI: 1.91 cm2
S' Lateral: 2.1 cm
Single Plane A2C EF: 65.4 %
Single Plane A4C EF: 63.9 %
Weight: 2070.56 [oz_av]

## 2023-01-04 LAB — TROPONIN I (HIGH SENSITIVITY)
Troponin I (High Sensitivity): 212 ng/L
Troponin I (High Sensitivity): 196 ng/L (ref <18)

## 2023-01-04 MED ORDER — LIDOCAINE 5 % EX PTCH
1.0000 | MEDICATED_PATCH | CUTANEOUS | Status: DC
  Administered 2023-01-06: 1 via TRANSDERMAL
  Filled 2023-01-04 (×5): qty 1

## 2023-01-04 MED ORDER — HEPARIN SODIUM (PORCINE) 1000 UNIT/ML DIALYSIS: 1000.0000 [IU] | INTRAMUSCULAR | Status: DC | PRN

## 2023-01-04 MED ORDER — HEPARIN SODIUM (PORCINE) 1000 UNIT/ML DIALYSIS
20.0000 [IU]/kg | INTRAMUSCULAR | Status: DC | PRN
  Administered 2023-01-04: 1200 [IU] via INTRAVENOUS_CENTRAL
  Filled 2023-01-04: qty 2

## 2023-01-04 MED ORDER — LIDOCAINE HCL (PF) 1 % IJ SOLN: 5.0000 mL | INTRAMUSCULAR | Status: DC | PRN

## 2023-01-04 MED ORDER — PENTAFLUOROPROP-TETRAFLUOROETH EX AERO
1.0000 | INHALATION_SPRAY | CUTANEOUS | Status: DC | PRN
Start: 2023-01-04 — End: 2023-01-04

## 2023-01-04 MED ORDER — CLONIDINE HCL 0.3 MG/24HR TD PTWK
0.3000 mg | MEDICATED_PATCH | TRANSDERMAL | Status: DC
  Administered 2023-01-04: 0.3 mg via TRANSDERMAL
  Filled 2023-01-04: qty 1

## 2023-01-04 MED ORDER — LIDOCAINE-PRILOCAINE 2.5-2.5 % EX CREA: 1.0000 | TOPICAL_CREAM | CUTANEOUS | Status: DC | PRN

## 2023-01-04 MED ORDER — ASPIRIN 81 MG PO TBEC: 81.0000 mg | DELAYED_RELEASE_TABLET | Freq: Once | ORAL | Status: DC

## 2023-01-04 MED ORDER — ANTICOAGULANT SODIUM CITRATE 4% (200MG/5ML) IV SOLN: 5.0000 mL | Status: DC | PRN

## 2023-01-04 MED ORDER — ASPIRIN 325 MG PO TABS
325.0000 mg | ORAL_TABLET | Freq: Once | ORAL | Status: DC
Start: 2023-01-04 — End: 2023-01-04

## 2023-01-04 MED ORDER — PENTAFLUOROPROP-TETRAFLUOROETH EX AERO: 1.0000 | INHALATION_SPRAY | CUTANEOUS | Status: DC | PRN

## 2023-01-04 NOTE — Progress Notes (Signed)
  Echocardiogram 2D Echocardiogram has been performed.  Ocie Doyne RDCS 01/04/2023, 8:34 AM

## 2023-01-04 NOTE — Progress Notes (Signed)
   01/04/23 1026  During Treatment Monitoring  Intra-Hemodialysis Comments  (Patient complained of chest pain.  PA Collins evaluated and patient says that the pain comes and goes.  Nausea was relieved by the Zofran I gave, see MAR.  PA Thomasena Edis will discuss with cardiologist.)

## 2023-01-04 NOTE — Progress Notes (Signed)
[facility] on call resident paged to assess patient at bedside as she experienced chest pain.   Patient had just returned from the bathroom and gotten into bed when she experienced sharp discomfort in the L chest, about 8/10. It was not associated with radiation to the L arm or jar, nausea, vomiting, lightheadedness or abdominal pain. Denies pleurisy or increased cough. No headaches. She reports she has had these episodes  intermittently since her TAVR procedure while at home. Some of this discomfort was present on admission and had not recurred since. By the time of this interview, patient reported resolution of chest pain and was comfortable in bed. Denies need for pain control at this time.   Vitals:   01/04/23 0332 01/04/23 0556  BP: (!) 139/56 (!) 150/58  Pulse: 69 67  Resp: 18   Temp: 98 F (36.7 C)   SpO2: 100% 100%   Elderly woman was resting in bed comfortably RRR without murmurs, speaking in full sentences on RA, rales bilaterally Abdomen with normal BS was soft, tender, non distended.  No peripheral edema and with intact and symmetrical radial and DP pulses.  Alert and oriented x3 Skin warm and dry   [name] with a history of CAD s/p CABG x2 with LIMA to LAD and SVG to diag in 2022, severe AS recent TAVR on 12/23/2022 with valve underexpasion due to severe calcification, ESRD on HD MWF, admitted for Hi bleed. 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 On admission, there was a troponin elevation that was thought to be due to demand ischemia secondary to ESRD and volume overload. Repeat EKG today  LBBB seen on admission EKG, however there is an ST segment elevation on V4. Given complex cardiac history, will trend troponin at this time. Given GI bleeding, will hold off on ASA unless increase delta in troponin.  - Trend troponin - Follow up TTE, ordered - Consider restarting ASA and protonix in setting of GI bleeding - Will sign off to day team for re-evaluation

## 2023-01-04 NOTE — Progress Notes (Addendum)
Subjective:   Summary: Jodi Bell is an 62 y.o. female with past medical history significant for hypertension, DM, HLD, gout, CAD status post CABG, CHF, severe aortic stenosis status post recent TAVR on 12/23/2022, COPD, ESRD on HD MWF, presents today because of vomiting and hypotension from the dialysis.    Hospital Day 2  Overnight Events: None  Subjective:  Patient is doing better this morning and mentating more at her baseline.  Yesterday she was very sleepy and sometimes difficult to obtain information due to misunderstanding questions or repeating answers.  Today she is very clear and answers questions appropriately without any significant delay.  She had some chest pain overnight that was evaluated by her night team, please see additional note.  This pain has come back again this morning but was not occurring while I was in the room.  Pain is in the left side of her chest, described as sharp, lasting less than 5 minutes, without associated symptoms, but consistent with her prior chest pain that resulted in her undergoing CABG.  This pain started to occur after her recent TAVR.  Otherwise she is doing well and is having bowel movements that are described as green.  She is also having just a little bit of trouble eating due to her dentition without yet getting dentures.  Objective:  Vital signs in last 24 hours: Vitals:   01/03/23 1951 01/03/23 2000 01/04/23 0332 01/04/23 0556  BP:  (!) 128/52 (!) 139/56 (!) 150/58  Pulse:  69 69 67  Resp:   18   Temp:  99.2 F (37.3 C) 98 F (36.7 C)   TempSrc:  Oral Oral   SpO2: 97% 100% 100% 100%  Weight:      Height:       Supplemental O2: Nasal Cannula SpO2: 100 % O2 Flow Rate (L/min): 3 L/min FiO2 (%): 32 %   Physical Exam:  Constitutional: Chronically ill-appearing elderly female Cardiovascular: RRR, systolic murmur at the left upper sternal border.  Palpable thrill over left upper extremity AV  fistula. Pulmonary/Chest: normal work of breathing Abdominal: soft, non-tender, non-distended Neuro: Alert and oriented x 3, no focal deficits noted Skin: warm and dry Extremities: upper/lower extremity pulses 2+, no lower extremity edema present  No intake or output data in the 24 hours ending 01/04/23 0653  Net IO Since Admission: -1,401 mL [01/04/23 0653]  Pertinent Labs:    Latest Ref Rng & Units 01/03/2023    7:45 AM 01/02/2023    8:45 PM 01/02/2023    4:00 PM  CBC  WBC 4.0 - 10.5 K/uL 5.0     Hemoglobin 12.0 - 15.0 g/dL 8.2  8.8  9.5   Hematocrit 36.0 - 46.0 % 27.2  26.0  28.0   Platelets 150 - 400 K/uL 100          Latest Ref Rng & Units 01/03/2023    7:45 AM 01/02/2023    9:01 PM 01/02/2023    8:45 PM  CMP  Glucose 70 - 99 mg/dL 61     BUN 8 - 23 mg/dL 22     Creatinine 9.14 - 1.00 mg/dL 7.82     Sodium 956 - 213 mmol/L 133   132   Potassium 3.5 - 5.1 mmol/L 3.1  5.6  6.4   Chloride 98 - 111 mmol/L 96     CO2 22 - 32 mmol/L 25  Calcium 8.9 - 10.3 mg/dL 8.3       Imaging: No results found.  Assessment/Plan:   Principal Problem:   GI bleed Active Problems:   Essential hypertension   COPD (chronic obstructive pulmonary disease) (HCC)   Acute anemia   ESRD (end stage renal disease) (HCC)   Chronic heart failure (HCC)   Demand ischemia (HCC)   Thrombocytopenia (HCC)   Respiratory failure with hypoxia and hypercapnia (HCC)   Biventricular congestive heart failure (HCC)   Myocardial injury   Uremia   S/P TAVR (transcatheter aortic valve replacement)   Coronary artery disease involving coronary bypass graft of native heart without angina pectoris   ESRD (end stage renal disease) on dialysis (HCC)   Acute diastolic (congestive) heart failure (HCC)   #Recurrent left-sided chest pain #Demand ischemia #CAD Patient has a history of CAD s/p CABG x2 with LIMA to LAD and SVG to Diag in 2022. Initial troponin elevated at 252 with a downtrend to 230.   Initially did not complain of any chest pain however overnight she had some recurrent left-sided chest pain which has been happening over the last few weeks.  Repeat EKG was largely stable from admission without obvious signs of ischemic change.  On admission we did hold her Imdur and Coreg due to low blood pressures but these were resumed yesterday. - Follow-up troponin - Continue Imdur and Coreg at home doses - Add lidocaine patch and if not responsive can add nitroglycerin patch as needed  #GI bleeding #Acute on chronic anemia Patient has a history of GI bleeding due to peptic ulcer disease and Dieulafoy lesion, reporting at least one day of dark stools. She was started on daily ASA after recent TAVR on 12/23/22.  Rectal exam yesterday without bloody stool and today she is having green stools.  Hemoglobin did drop from 8.2-7.6 but no signs of a brisk GI bleed at this time.  Peripheral smear without abnormal RBCs or platelets.  Iron panel consistent with anemia of chronic disease.  GI evaluated the patient yesterday and recommended PPI twice daily but no endoscopic workup at this time. -Continue IV PPI twice daily -Holding anticoagulation and antiplatelet agents -repeat H&H if becoming more hypotensive or having stools more frequently or black/bloody  #ESRD #Hyperkalemia HD per nephro.  #Chronic HFpEF #Aortic stenosis s/p TAVR Continues to be hypervolemic with JVD today.  No schistocytes on blood smear.  Echo after the TAVR showed underexpanded valve due to severe calcification.  Cardiology considering structural heart evaluation on Monday. - Repeat TTE today - Volume removal with HD.  # Encephalopathy, most likely uremic, improving Mental status is much improved today after dialysis and some sleep yesterday.  Patient does think that taking her Ativan and Ambien close together 1 day this week may have made her confused.  Discussed decreasing or removing 1 of both these medications that she  would prefer to remove Ambien before Ativan.  We are not giving her Ambien here but she does have her Ativan as needed which is a chronic home med. - Continue Ativan and consider discontinuing Ambien on discharge with recommendations for PCP to add back with caution or not add back at all  #COPD -Home 2-3L O2 [place] -Triple therapy LAMA/LABA, ICS via neg, q6h duonebs prn  #Thrombocytopenia Long history of thrombocytopenia and fatty liver. Low suspicion for DIC or TTP. Platelet count 100k at admission and stable.  Blood smear without abnormal morphology. -Continue to monitor  #Hypertension -Continue carvedilol 25 mg twice  daily, Imdur 60 mg daily - Resume clonidine 0.3 mg patch due to risk of rebound hypertension - Continue to hold olmesartan 40 mg daily, nifedipine 60 mg twice daily, hydralazine 100 mg 3 times daily.  Will add back as tolerated  Diet: Renal diet VTE: SCDs Code: Full  Dispo: Anticipated discharge to Home pending medical stability.   Rocky Morel, DO Internal Medicine Resident, PGY-2 Pager# [phone] Please contact the on call pager after 5 pm and on weekends at [phone]. 7:57 AM 01/04/2023

## 2023-01-04 NOTE — Progress Notes (Signed)
Received patient in bed to unit.  Alert and oriented.  Informed consent signed and in chart.   TX duration: 3 Hours  Patient tolerated well.  Transported back to the room  Alert, without acute distress.  Hand-off given to patient's nurse.   Access used: L Upper Arm Fistula Access issues: none  Total UF removed: 1500 Medication(s) given: Zofran   01/04/23 1237  Vitals  Temp 98.5 F (36.9 C)  Temp Source Oral  BP (!) 144/59  MAP (mmHg) 81  Pulse Rate 68  ECG Heart Rate 67  Resp (!) 21  Oxygen Therapy  SpO2 100 %  O2 Device Nasal Cannula  O2 Flow Rate (L/min) 3 L/min  During Treatment Monitoring  HD Safety Checks Performed Yes  Intra-Hemodialysis Comments Tx completed;Tolerated well  Dialysis Fluid Bolus Normal Saline  Bolus Amount (mL) 300 mL  Fistula / Graft Left Upper arm  Placement Date/Time: 05/17/18 0800   Orientation: Left  Access Location: Upper arm  Status Deaccessed     Stacie Glaze LPN [facility]

## 2023-01-04 NOTE — Progress Notes (Signed)
Progress Note  Patient Name: Jodi Bell Date of Encounter: 01/04/2023  Primary Cardiologist: Donato Schultz, MD  Subjective   Patient seen and examined in the dialysis area.  Complains of intermittent chest pains.  Troponins mildly elevated.  Inpatient Medications    Scheduled Meds:  arformoterol  15 mcg Nebulization BID   budesonide (PULMICORT) nebulizer solution  0.25 mg Nebulization BID   carvedilol  25 mg Oral BID WC   Chlorhexidine Gluconate Cloth  6 each Topical Q0600   Chlorhexidine Gluconate Cloth  6 each Topical Q0600   cloNIDine  0.3 mg Transdermal Q Sun   isosorbide mononitrate  60 mg Oral Daily   lidocaine  1 patch Transdermal Q24H   pantoprazole (PROTONIX) IV  40 mg Intravenous Q12H   predniSONE  5 mg Oral Q breakfast   revefenacin  175 mcg Nebulization Daily   Continuous Infusions:  anticoagulant sodium citrate     PRN Meds: acetaminophen, anticoagulant sodium citrate, heparin, heparin, ipratropium-albuterol, lidocaine (PF), lidocaine-prilocaine, LORazepam, ondansetron (ZOFRAN) IV **OR** ondansetron, pentafluoroprop-tetrafluoroeth   Vital Signs    Vitals:   01/04/23 0945 01/04/23 1000 01/04/23 1017 01/04/23 1030  BP: 90/68 132/62 (!) 158/63 (!) 145/76  Pulse:  71 70 (!) 178  Resp:  (!) 21 19 (!) 21  Temp:      TempSrc:      SpO2:  97% 100% 96%  Weight:      Height:        Intake/Output Summary (Last 24 hours) at 01/04/2023 1047 Last data filed at 01/04/2023 1000 Gross per 24 hour  Intake --  Output 100 ml  Net -100 ml   Filed Weights   01/03/23 0623 01/03/23 1618 01/04/23 0903  Weight: 71.2 kg 58.7 kg 65.8 kg    Telemetry     Personally reviewed, normal sinus rhythm with LBBB.  ECG    Not performed today  Physical Exam   GEN: No acute distress.   Neck: JVD+ Cardiac: RRR, no murmur, rub, or gallop.  Respiratory: Bilateral Rales present GI: Soft, nontender, bowel sounds present. MS: No edema; No deformity. Neuro:   Nonfocal. Psych: Alert and oriented x 3. Normal affect.  Labs    Chemistry Recent Labs  Lab 01/02/23 1811 01/02/23 2045 01/02/23 2101 01/03/23 0745 01/04/23 0702  NA 131* 132*  --  133* 133*  K 6.2* 6.4* 5.6* 3.1* 4.2  CL 100  --   --  96* 94*  CO2 22  --   --  25 28  GLUCOSE 90  --   --  61* 97  BUN 51*  --   --  22 31*  CREATININE 8.19*  --   --  4.55* 6.19*  CALCIUM 8.3*  --   --  8.3* 9.1  PROT 5.0*  --   --   --   --   ALBUMIN 2.4*  2.4*  --   --   --   --   AST 22  --   --   --   --   ALT 11  --   --   --   --   ALKPHOS 48  --   --   --   --   BILITOT 1.2*  --   --   --   --   GFRNONAA 5*  --   --  10* 7*  ANIONGAP 9  --   --  12 11     Hematology Recent Labs  Lab 01/02/23  1514 01/02/23 1600 01/02/23 2045 01/03/23 0745 01/04/23 0702  WBC 6.0  --   --  5.0 5.5  RBC 2.65*  --   --  2.54* 2.40*  HGB 8.2*   < > 8.8* 8.2* 7.6*  HCT 28.8*   < > 26.0* 27.2* 25.8*  MCV 108.7*  --   --  107.1* 107.5*  MCH 30.9  --   --  32.3 31.7  MCHC 28.5*  --   --  30.1 29.5*  RDW 15.9*  --   --  15.9* 15.9*  PLT 121*  --   --  100* 107*   < > = values in this interval not displayed.    Cardiac Enzymes Recent Labs  Lab 01/02/23 1514 01/02/23 1811 01/04/23 0702 01/04/23 0752  TROPONINIHS 252* 230* 212* 196*    BNPNo results for input(s): "BNP", "PROBNP" in the last 168 hours.   DDimerNo results for input(s): "DDIMER" in the last 168 hours.   Radiology    CT ABDOMEN PELVIS WO CONTRAST  Result Date: 01/02/2023 CLINICAL DATA:  Abdomen pain nausea vomiting hypotension dialysis patient EXAM: CT ABDOMEN AND PELVIS WITHOUT CONTRAST TECHNIQUE: Multidetector CT imaging of the abdomen and pelvis was performed following the standard protocol without IV contrast. RADIATION DOSE REDUCTION: This exam was performed according to the departmental dose-optimization program which includes automated exposure control, adjustment of the mA and/or kV according to patient size and/or use  of iterative reconstruction technique. COMPARISON:  CT 09/19/2022 FINDINGS: Lower chest: Lung bases demonstrate small pleural effusions and cardiomegaly. No acute airspace disease. Coronary vascular calcifications. Aortic valve prosthesis. Hepatobiliary: No calcified gallstone or biliary dilatation. No focal hepatic abnormality. Pancreas: Unremarkable. No pancreatic ductal dilatation or surrounding inflammatory changes. Spleen: Normal in size without focal abnormality. Adrenals/Urinary Tract: Adrenal glands are normal. Kidneys show no hydronephrosis. Extensive renal vascular calcifications. Probable renal cysts for which no imaging follow-up is recommended. Decompressed urinary bladder Stomach/Bowel: Stomach nonenlarged. There is no dilated small bowel. Diffuse diverticular disease of the colon. Under distended colon versus mild colitis type changes. No pericolonic inflammation. Vascular/Lymphatic: Advanced aortic atherosclerosis. No aneurysm. Mildly enlarged retroperitoneal lymph nodes without significant change. Reproductive: Uterus and bilateral adnexa are unremarkable. Other: Negative for free air. Small volume free fluid within the abdomen and pelvis. Generalized subcutaneous edema consistent with anasarca Musculoskeletal: Posterior lumbar fusion hardware at L3-L4. Chronic deformity at L2. Chronic endplate irregularity at L2-L3. Diffuse skeletal sclerosis likely corresponding to chronic kidney disease history. IMPRESSION: 1. Negative for bowel obstruction or free air. Under distended colon versus mild colitis type changes. Diffuse diverticular disease. 2. Small volume free fluid within the abdomen and pelvis. Generalized subcutaneous edema consistent with anasarca. Small pleural effusions. Cardiomegaly. 3. Aortic atherosclerosis. Aortic Atherosclerosis (ICD10-I70.0). Electronically Signed   By: Jasmine Pang M.D.   On: 01/02/2023 21:46   CT Head Wo Contrast  Result Date: 01/02/2023 CLINICAL DATA:  Mental  status change EXAM: CT HEAD WITHOUT CONTRAST TECHNIQUE: Contiguous axial images were obtained from the base of the skull through the vertex without intravenous contrast. RADIATION DOSE REDUCTION: This exam was performed according to the departmental dose-optimization program which includes automated exposure control, adjustment of the mA and/or kV according to patient size and/or use of iterative reconstruction technique. COMPARISON:  MRI 09/20/2022, CT brain 05/02/2021 FINDINGS: Brain: No acute territorial infarction, hemorrhage or intracranial mass. Mild to moderate white matter hypodensity consistent with chronic small vessel ischemic change. Stable ventricular size Vascular: No hyperdense vessels.  Carotid vascular calcification. Skull:  No fracture. Diffuse sclerosis probably due to history of chronic kidney disease through Sinuses/Orbits: No acute finding. Other: None IMPRESSION: 1. No CT evidence for acute intracranial abnormality. 2. Mild to moderate chronic small vessel ischemic changes of the white matter. Electronically Signed   By: Jasmine Pang M.D.   On: 01/02/2023 21:33   DG Chest 2 View  Result Date: 01/02/2023 CLINICAL DATA:  Chest pain and weakness. EXAM: CHEST - 2 VIEW COMPARISON:  12/19/2022. FINDINGS: There is moderate diffuse pulmonary vascular congestion/edema. No dense consolidation or lung collapse. Bilateral costophrenic angles are clear. No pneumothorax. Stable mildly enlarged cardio-mediastinal silhouette, which is accentuated by low lung volume. There are surgical staples along the heart border and sternotomy wires, status post CABG (coronary artery bypass graft). Prosthetic aortic valve and presumed cardiac monitor noted overlying the left anterior chest wall. No acute osseous abnormalities. Lower cervical spinal fixation hardware seen. The soft tissues are within normal limits. IMPRESSION: *Findings favor congestive heart failure/pulmonary edema. Electronically Signed   By: Jules Schick M.D.   On: 01/02/2023 15:40     Assessment & Plan   Acute on chronic diastolic heart failure initially on BiPAP, currently on [place] 2L per minute Severe AS s/p TAVR on 12/23/2022 (Valve underexpansion due to severe calcification of the aortic annulus) New LBBB post-TAVR Myocardial injury likely secondary to volume overload CAD s/p CABG in 2022 Hypotension improved after HD ESRD DD   -Patient had recurrent nausea vomiting and was sent from the dialysis center for hypotension with SBP 80 mmHg and vomiting.  She missed dialysis session due to eye pain a few days ago. Volume overloaded, 1.5L fluid removal yesterday.  She is in dialysis area today for additional fluid removal. Complains of intermittent chest pains since yesterday, troponins mildly elevated. Probably secondary to volume overload. Will need to reevaluate her symptoms after hemodialysis.  Echocardiogram today showed normal LVEF, moderate RV systolic dysfunction and moderate RV enlargement which are not new, aortic valve mean gradient decreased from 40 mm Hg (on prior echocardiogram) to 21 mm Hg today.  Findings were consistent with patient prosthesis mismatch and mild regurgitation.  CVP was 15 mmHg.  I do not see any reason for structural team to evaluate her in this admission due to improved echo findings.  Will schedule for outpatient follow-up visit. -New LBBB after TAVR, telemetry reviewed, no alarms. -Has troponin elevation, has intermittent chest pains in the last 24 hours, likely secondary to volume overload, will need to reevaluate her symptoms after hemodialysis.   Signed, Marjo Bicker, MD  01/04/2023, 10:47 AM

## 2023-01-04 NOTE — Progress Notes (Signed)
   01/04/23 2252  BiPAP/CPAP/SIPAP  BiPAP/CPAP/SIPAP Pt Type Adult  BiPAP/CPAP/SIPAP Resmed  Mask Type Full face mask  Mask Size Medium  Respiratory Rate 16 breaths/min  IPAP 14 cmH20  EPAP 8 cmH2O  Flow Rate 3 lpm  Patient Home Equipment No  Auto Titrate No  BiPAP/CPAP /SiPAP Vitals  Pulse Rate 66  SpO2 98 %

## 2023-01-04 NOTE — Telephone Encounter (Signed)
Paged by [facility] regarding critical EKG [phone]). per [facility] patient had 11 seconds of complete heart block at 9:57 AM this morning with baseline heart rate between 31 to 37 bpm.  Interestingly, patient is currently admitted at [facility] for volume overload and is getting dialysis at this time and hooked up to a telemetry unit.  Review of the telemetry unit indicated possible pauses occurred around 9:59 AM, however due to the very significant motion artifact underneath, I do not think this can be accurately called complete heart block.  After that motion artifact resolved, underlying rhythm showed sinus rhythm without further conduction abnormality.  Will continue to monitor at this time.

## 2023-01-04 NOTE — Progress Notes (Signed)
[facility] Progress Note  Subjective: [place] O2 2-3 Lpm, same as at home.   Vitals:   01/04/23 1243 01/04/23 1249 01/04/23 1317 01/04/23 1725  BP: (!) 162/54  (!) 152/54 (!) 117/46  Pulse: 67  66 65  Resp: 19  19 18   Temp:   98.7 F (37.1 C) 98.2 F (36.8 C)  TempSrc:   Oral Oral  SpO2: 100%  97% 100%  Weight:  64.3 kg    Height:        Exam:  Pt looks calmer, on 2L O2 Respiratory: Clear bilateral, no wheezing or crackle Cardiovascular: Regular rate rhythm S1-S2 normal, no rubs GI: Abdomen soft, nontender, nondistended Extremities, no cyanosis or clubbing, no edema Skin: No rash or ulcer Neurology: restless as above Dialysis Access: AV fistula +bruit .      OP HD: [facility] MWF [address]  from 12/25/22 (needs updating) --> 3.5h  58.5kg  400/600  2K/3Ca bath  Hep none  L AVF   CXR 11/22 - bilat pulm edema   1.5 L off w/ HD last night    Standing weight today was 58.7kg, which is close to her dry wt    Assessment/Plan:   # Acute on chronic hypoxic/hypercapnic respiratory failure - with pulmonary edema on x-ray. On BiPAP initially. Had HD Friday night w/ 1.5 L off then was on 2-3 L [place]. Had HD today (Sunday) w/ 1.6 L off . On 2 L [place]. No vol excess on exam.    # ESRD: MWF HD w/ [facility]. Has missed dialysis. Has had HD x 2 her as above, including today (Sunday HD for holiday schedule). Next HD Tuesday.    # Hypertension/volume: She is supposed to be on multiple antihypertensives.  BP's were soft but now are normal.     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   # CKD-metabolic Bone Disease: Monitor calcium/phosphorus level.

## 2023-01-04 NOTE — Progress Notes (Signed)
   01/04/23 2255  BiPAP/CPAP/SIPAP  BiPAP/CPAP/SIPAP Pt Type Adult  BiPAP/CPAP/SIPAP Resmed  Reason BIPAP/CPAP not in use  (pt sated she doesnt want to wear CPAP due to her anxiety getting worse with the CPAP mask on.  pt taken off CPAP and placed back on 3L [place])

## 2023-01-05 DIAGNOSIS — R7989 Other specified abnormal findings of blood chemistry: Secondary | ICD-10-CM | POA: Diagnosis not present

## 2023-01-05 DIAGNOSIS — I5032 Chronic diastolic (congestive) heart failure: Secondary | ICD-10-CM

## 2023-01-05 DIAGNOSIS — J449 Chronic obstructive pulmonary disease, unspecified: Secondary | ICD-10-CM

## 2023-01-05 DIAGNOSIS — I120 Hypertensive chronic kidney disease with stage 5 chronic kidney disease or end stage renal disease: Secondary | ICD-10-CM | POA: Diagnosis not present

## 2023-01-05 DIAGNOSIS — D649 Anemia, unspecified: Secondary | ICD-10-CM

## 2023-01-05 DIAGNOSIS — I5033 Acute on chronic diastolic (congestive) heart failure: Secondary | ICD-10-CM | POA: Diagnosis not present

## 2023-01-05 DIAGNOSIS — K922 Gastrointestinal hemorrhage, unspecified: Secondary | ICD-10-CM | POA: Diagnosis not present

## 2023-01-05 DIAGNOSIS — R079 Chest pain, unspecified: Secondary | ICD-10-CM | POA: Diagnosis not present

## 2023-01-05 DIAGNOSIS — E875 Hyperkalemia: Secondary | ICD-10-CM

## 2023-01-05 DIAGNOSIS — D696 Thrombocytopenia, unspecified: Secondary | ICD-10-CM

## 2023-01-05 DIAGNOSIS — I350 Nonrheumatic aortic (valve) stenosis: Secondary | ICD-10-CM

## 2023-01-05 LAB — RENAL FUNCTION PANEL
Albumin: 2.7 g/dL — ABNORMAL LOW (ref 3.5–5.0)
Anion gap: 4 — ABNORMAL LOW (ref 5–15)
BUN: 15 mg/dL (ref 8–23)
CO2: 30 mmol/L (ref 22–32)
Calcium: 9.2 mg/dL (ref 8.9–10.3)
Chloride: 98 mmol/L (ref 98–111)
Creatinine, Ser: 4.27 mg/dL — ABNORMAL HIGH (ref 0.44–1.00)
GFR, Estimated: 11 mL/min — ABNORMAL LOW (ref >60)
Glucose, Bld: 112 mg/dL — ABNORMAL HIGH (ref 70–99)
Phosphorus: 4 mg/dL (ref 2.5–4.6)
Potassium: 4.3 mmol/L (ref 3.5–5.1)
Sodium: 132 mmol/L — ABNORMAL LOW (ref 135–145)

## 2023-01-05 LAB — CBC
HCT: 24.6 % — ABNORMAL LOW (ref 36.0–46.0)
Hemoglobin: 7 g/dL — ABNORMAL LOW (ref 12.0–15.0)
MCH: 31 pg (ref 26.0–34.0)
MCHC: 28.5 g/dL — ABNORMAL LOW (ref 30.0–36.0)
MCV: 108.8 fL — ABNORMAL HIGH (ref 80.0–100.0)
Platelets: 91 10*3/uL — ABNORMAL LOW (ref 150–400)
RBC: 2.26 MIL/uL — ABNORMAL LOW (ref 3.87–5.11)
RDW: 15.5 % (ref 11.5–15.5)
WBC: 4 10*3/uL (ref 4.0–10.5)
nRBC: 0.5 % — ABNORMAL HIGH (ref 0.0–0.2)

## 2023-01-05 LAB — WET PREP, GENITAL
Clue Cells Wet Prep HPF POC: NONE SEEN
Sperm: NONE SEEN
Trich, Wet Prep: NONE SEEN
WBC, Wet Prep HPF POC: <10 (ref <10)
Yeast Wet Prep HPF POC: NONE SEEN

## 2023-01-05 LAB — PREPARE RBC (CROSSMATCH)

## 2023-01-05 MED ORDER — SODIUM CHLORIDE 0.9% IV SOLUTION
Freq: Once | INTRAVENOUS | Status: DC
Start: 2023-01-05 — End: 2023-01-07

## 2023-01-05 MED ORDER — GUAIFENESIN-DM 100-10 MG/5ML PO SYRP
5.0000 mL | ORAL_SOLUTION | ORAL | Status: DC | PRN
  Administered 2023-01-05: 5 mL via ORAL
  Filled 2023-01-05: qty 5

## 2023-01-05 NOTE — Progress Notes (Signed)
   01/05/23 2313  BiPAP/CPAP/SIPAP  BiPAP/CPAP/SIPAP Pt Type Adult  BiPAP/CPAP/SIPAP Resmed  Mask Type Full face mask  Mask Size Medium  Respiratory Rate 16 breaths/min  EPAP 10 cmH2O  FiO2 (%) 32 %  Flow Rate 3 lpm  Patient Home Equipment No  Auto Titrate No  BiPAP/CPAP /SiPAP Vitals  Resp 16  SpO2 97 %  MEWS Score/Color  MEWS Score 0  MEWS Score Color [place]

## 2023-01-05 NOTE — Progress Notes (Signed)
Pt. Only tolerated the cpap for about 3 minutes and then requested to come off the cpap due to her anxiety. Pt. Placed back on 3L nasal cannula.

## 2023-01-05 NOTE — Progress Notes (Signed)
[facility] Progress Note   Subjective: Seen in room up in chair. HGB 7.0 noted. Will transfuse in HD tomorrow.     Objective Vitals:   01/04/23 2252 01/05/23 0415 01/05/23 0758 01/05/23 0900  BP:  (!) 154/61  128/64  Pulse: 66 66 64   Resp:  18 18   Temp:  98.3 F (36.8 C)  98.4 F (36.9 C)  TempSrc:  Oral  Oral  SpO2: 98% 97% 94% 95%  Weight:      Height:       Physical Exam General: Pleasant older female in NAD Heart: S1,S2 RRR 2/6 SM Lungs: CTAB A/P Abdomen: NABS NT Extremities: No LE edema Dialysis Access: L AVF +T/B    Additional Objective Labs: Basic Metabolic Panel: Recent Labs  Lab 01/02/23 1811 01/02/23 2045 01/03/23 0745 01/04/23 0702 01/05/23 0405  NA 131*   < > 133* 133* 132*  K 6.2*   < > 3.1* 4.2 4.3  CL 100  --  96* 94* 98  CO2 22  --  25 28 30   GLUCOSE 90  --  61* 97 112*  BUN 51*  --  22 31* 15  CREATININE 8.19*  --  4.55* 6.19* 4.27*  CALCIUM 8.3*  --  8.3* 9.1 9.2  PHOS 4.0  --  2.4*  --  4.0   < > = values in this interval not displayed.   Liver Function Tests: Recent Labs  Lab 01/02/23 1811 01/05/23 0405  AST 22  --   ALT 11  --   ALKPHOS 48  --   BILITOT 1.2*  --   PROT 5.0*  --   ALBUMIN 2.4*  2.4* 2.7*   No results for input(s): "LIPASE", "AMYLASE" in the last 168 hours. CBC: Recent Labs  Lab 01/02/23 1514 01/02/23 1600 01/03/23 0745 01/04/23 0702 01/05/23 0405  WBC 6.0  --  5.0 5.5 4.0  NEUTROABS  --   --  3.0  --   --   HGB 8.2*   < > 8.2* 7.6* 7.0*  HCT 28.8*   < > 27.2* 25.8* 24.6*  MCV 108.7*  --  107.1* 107.5* 108.8*  PLT 121*  --  100* 107* 91*   < > = values in this interval not displayed.   Blood Culture    Component Value Date/Time   SDES BLOOD RIGHT FOREARM 01/03/2023 1009   SPECREQUEST  01/03/2023 1009    BOTTLES DRAWN AEROBIC AND ANAEROBIC Blood Culture adequate volume   CULT  01/03/2023 1009    NO GROWTH 2 DAYS Performed at [facility], [address]  [place], [place] [address]    REPTSTATUS PENDING 01/03/2023 1009    Cardiac Enzymes: No results for input(s): "CKTOTAL", "CKMB", "CKMBINDEX", "TROPONINI" in the last 168 hours. CBG: Recent Labs  Lab 01/02/23 1410 01/03/23 0132  GLUCAP 103* 89   Iron Studies:  Recent Labs    01/03/23 0745  IRON 57  TIBC 141*  FERRITIN 569*   @[email or web address] @ Studies/Results: ECHOCARDIOGRAM COMPLETE  Result Date: 01/04/2023    ECHOCARDIOGRAM REPORT   Patient Name:   Jodi Bell Date of Exam: 01/04/2023 Medical Rec #:  [id number]        Height:       61.0 in Accession #:    [id number]       Weight:       129.4 lb Date of Birth:  March 19, 1960         BSA:  1.570 m Patient Age:    62 years         BP:           150/58 mmHg Patient Gender: F                HR:           65 bpm. Exam Location:  Inpatient Procedure: 2D Echo, Cardiac Doppler and Color Doppler STAT ECHO Indications:    Abnormal ECG  History:        Patient has prior history of Echocardiogram examinations, most                 recent 12/24/2022. CHF, CAD, Prior CABG, COPD, Arrythmias:LBBB                 and Atrial Fibrillation; Risk Factors:Hypertension. TAVR                 12/23/2022.                 Aortic Valve: 23 mm Edwards Sapien 3 Ultra Resilia (TAVR) Sapien                 prosthetic, stented (TAVR) valve is present in the aortic                 position. Procedure Date: 12/23/2022.  Sonographer:    Vern Claude Referring Phys: [id number] JEFFREY C HATCHER IMPRESSIONS  1. The aortic valve has been repaired/replaced. Aortic valve regurgitation is mild. There is a 23 mm Edwards Sapien 3 Ultra Resilia (TAVR) Sapien prosthetic (TAVR) valve present in the aortic position. Procedure Date: 12/23/2022. Aortic valve area, by VTI measures 1.07 cm. Aortic valve mean gradient measures 21.5 mmHg (improved compared to 40 mm Hg on prior Echo). Aortic valve Vmax measures 3.56 m/s. Aortic valve acceleration time measures 126 msec. Aortic valve ejection time  measures 320 msec. DVI is 0.49. Findings consistent with patient-prosthesis mismatch and mild regurgitation of aortic prosthesis.  2. Left ventricular ejection fraction, by estimation, is 60 to 65%. The left ventricle has normal function. The left ventricle has no regional wall motion abnormalities. There is moderate concentric left ventricular hypertrophy. Left ventricular diastolic parameters are consistent with Grade II diastolic dysfunction (pseudonormalization). Elevated left ventricular end-diastolic pressure.  3. Right ventricular systolic function is moderately reduced. The right ventricular size is moderately enlarged. There is severely elevated pulmonary artery systolic pressure.  4. Left atrial size was severely dilated.  5. Right atrial size was moderately dilated.  6. The mitral valve is abnormal. Mild mitral valve regurgitation. Moderate mitral stenosis. The mean mitral valve gradient is 6.0 mmHg. Severe mitral annular calcification.  7. The tricuspid valve is abnormal. Tricuspid valve regurgitation is mild to moderate.  8. The inferior vena cava is dilated in size with <50% respiratory variability, suggesting right atrial pressure of 15 mmHg.  9. Increased flow velocities may be secondary to anemia, thyrotoxicosis, hyperdynamic or high flow state. Comparison(s): Changes from prior study are noted. Findings consistent with patient-prosthesis mismatch and mild regurgitation of the aortic prosthesis. Ao MPG improved from 40 mm Hg (on prior echo) to 21 mm Hg now. FINDINGS  Left Ventricle: Left ventricular ejection fraction, by estimation, is 60 to 65%. The left ventricle has normal function. The left ventricle has no regional wall motion abnormalities. The left ventricular internal cavity size was normal in size. There is  moderate concentric left ventricular hypertrophy. Left ventricular diastolic parameters are consistent with Grade II  diastolic dysfunction (pseudonormalization). Elevated left  ventricular end-diastolic pressure. Right Ventricle: The right ventricular size is moderately enlarged. No increase in right ventricular wall thickness. Right ventricular systolic function is moderately reduced. There is severely elevated pulmonary artery systolic pressure. The tricuspid regurgitant velocity is 4.11 m/s, and with an assumed right atrial pressure of 15 mmHg, the estimated right ventricular systolic pressure is 82.6 mmHg. Left Atrium: Left atrial size was severely dilated. Right Atrium: Right atrial size was moderately dilated. Pericardium: There is no evidence of pericardial effusion. Mitral Valve: The mitral valve is abnormal. Severe mitral annular calcification. Mild mitral valve regurgitation. Moderate mitral valve stenosis. MV peak gradient, 14.1 mmHg. The mean mitral valve gradient is 6.0 mmHg. Tricuspid Valve: The tricuspid valve is abnormal. Tricuspid valve regurgitation is mild to moderate. No evidence of tricuspid stenosis. Aortic Valve: The aortic valve has been repaired/replaced. Aortic valve regurgitation is mild. Aortic valve mean gradient measures 21.5 mmHg. Aortic valve peak gradient measures 50.7 mmHg. Aortic valve area, by VTI measures 1.07 cm. There is a 23 mm Edwards Sapien 3 Ultra Resilia (TAVR) Sapien prosthetic, stented (TAVR) valve present in the aortic position. Procedure Date: 12/23/2022. Pulmonic Valve: The pulmonic valve was normal in structure. Pulmonic valve regurgitation is mild. No evidence of pulmonic stenosis. Aorta: The aortic root and ascending aorta are structurally normal, with no evidence of dilitation. Venous: The inferior vena cava is dilated in size with less than 50% respiratory variability, suggesting right atrial pressure of 15 mmHg. IAS/Shunts: No atrial level shunt detected by color flow Doppler.  LEFT VENTRICLE PLAX 2D LVIDd:         3.50 cm      Diastology LVIDs:         2.10 cm      LV e' medial:    8.05 cm/s LV PW:         1.40 cm      LV E/e'  medial:  22.0 LV IVS:        1.30 cm      LV e' lateral:   10.30 cm/s LVOT diam:     1.65 cm      LV E/e' lateral: 17.2 LV SV:         76 LV SV Index:   49 LVOT Area:     2.14 cm  LV Volumes (MOD) LV vol d, MOD A2C: 164.0 ml LV vol d, MOD A4C: 151.0 ml LV vol s, MOD A2C: 56.7 ml LV vol s, MOD A4C: 54.5 ml LV SV MOD A2C:     107.3 ml LV SV MOD A4C:     151.0 ml LV SV MOD BP:      102.5 ml RIGHT VENTRICLE            IVC RV Basal diam:  4.20 cm    IVC diam: 2.50 cm RV Mid diam:    3.40 cm RV S prime:     6.53 cm/s TAPSE (M-mode): 1.0 cm LEFT ATRIUM              Index        RIGHT ATRIUM           Index LA Vol (A2C):   135.0 ml 86.00 ml/m  RA Area:     20.00 cm LA Vol (A4C):   75.8 ml  48.29 ml/m  RA Volume:   58.60 ml  37.33 ml/m LA Biplane Vol: 102.0 ml 64.98 ml/m  AORTIC VALVE  PULMONIC VALVE AV Area (Vmax):    0.91 cm      PV Vmax:          1.33 m/s AV Area (Vmean):   0.98 cm      PV Peak grad:     7.1 mmHg AV Area (VTI):     1.07 cm      PR End Diast Vel: 9.49 msec AV Vmax:           356.00 cm/s AV Vmean:          211.000 cm/s AV VTI:            0.712 m AV Peak Grad:      50.7 mmHg AV Mean Grad:      21.5 mmHg LVOT Vmax:         151.00 cm/s LVOT Vmean:        97.100 cm/s LVOT VTI:          0.357 m LVOT/AV VTI ratio: 0.50  AORTA Ao Root diam: 2.90 cm Ao Asc diam:  3.00 cm MITRAL VALVE                TRICUSPID VALVE MV Area (PHT): 3.48 cm     TR Peak grad:   67.6 mmHg MV Area VTI:   1.91 cm     TR Vmax:        411.00 cm/s MV Peak grad:  14.1 mmHg MV Mean grad:  6.0 mmHg     SHUNTS MV Vmax:       1.88 m/s     Systemic VTI:  0.36 m MV Vmean:      109.0 cm/s   Systemic Diam: 1.65 cm MV Decel Time: 218 msec MR Peak grad: 106.5 mmHg MR Mean grad: 59.0 mmHg MR Vmax:      516.00 cm/s MR Vmean:     349.0 cm/s MV E velocity: 177.00 cm/s MV A velocity: 138.00 cm/s MV E/A ratio:  1.28 Vishnu Priya Mallipeddi Electronically signed by Winfield Rast Mallipeddi Signature Date/Time: 01/04/2023/11:13:59  AM    Final    Medications:   arformoterol  15 mcg Nebulization BID   budesonide (PULMICORT) nebulizer solution  0.25 mg Nebulization BID   carvedilol  25 mg Oral BID WC   Chlorhexidine Gluconate Cloth  6 each Topical Q0600   Chlorhexidine Gluconate Cloth  6 each Topical Q0600   cloNIDine  0.3 mg Transdermal Q Sun   isosorbide mononitrate  60 mg Oral Daily   lidocaine  1 patch Transdermal Q24H   pantoprazole (PROTONIX) IV  40 mg Intravenous Q12H   predniSONE  5 mg Oral Q breakfast   revefenacin  175 mcg Nebulization Daily     OP HD: [facility] MWF [address]  from 12/25/22 (needs updating) --> 3.5h  58.5kg  400/600  2K/3Ca bath  Hep none  L AVF      Assessment/Plan:   # Acute on chronic hypoxic/hypercapnic respiratory failure - with pulmonary edema on x-ray. On BiPAP initially. Had HD Friday night w/ 1.5 L off then was on 2-3 L [place]. Had HD today (Sunday) w/ 1.6 L off . On 2 L . No vol excess on exam.    # ESRD: MWF HD w/ [facility]. Has missed dialysis. Has had HD x 2 her as above, including today (Sunday HD for holiday schedule). Next HD 01/06/2023    # Hypertension/volume: She is supposed to be on multiple antihypertensives.  BP's were soft but  now are normal. BP meds have been removed. Still above OP EDW. UF as tolerated to optimize volume.    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 HGB 7.0 Transfuse in HD 01/06/2023   # CKD-metabolic Bone Disease:PO4 at goal. Corrected calcium high. No VDRA in orders. Change to 2.0 Ca bath.       Lindon Kiel H. Mohamadou Maciver NP-C 01/05/2023, 9:51 AM  [employer] [phone]

## 2023-01-05 NOTE — TOC CM/SW Note (Addendum)
Transition of Care [facility]) - Inpatient Brief Assessment   Patient Details  Name: Jodi Bell MRN: [id number] Date of Birth: [birth year]  Transition of Care [facility]) CM/SW Contact:    Tom-Johnson, Hershal Coria, RN Phone Number: 01/05/2023, 5:05 PM   Clinical Narrative:  Patient presented to the ED from Outpatient HD with Fatigued, Hypotension and N/V. Found to have Hgb at 8.2, down from 9.9 on recent admit 12/24/22. Hgb today at 7.0. Nephrology following for inpatient HD, Cardiology following for mildly elevated Troponin, recent TAVR on 12/23/22.   From home with daughter, has three children and two supportive sisters. Able to drive prior to admission. Has all necessary DME's at home. Has Home O2 with [employer]. Patient states she established as a new patient at [facility] with Dr. Sherrie Mustache. Uses [facility] on [address] in [place]. Patient active with Home Health disciplines, orders will be placed for resumption of care at discharge.   CM will continue to follow as patient progresses with care towards discharge.            Transition of Care Asessment: Insurance and Status: Insurance coverage has been reviewed Patient has primary care physician: Yes Home environment has been reviewed: Yes Prior level of function:: Modified Independent   Social Determinants of Health Reivew: SDOH reviewed no interventions necessary Readmission risk has been reviewed: Yes Transition of care needs: transition of care needs identified, TOC will continue to follow

## 2023-01-05 NOTE — Progress Notes (Signed)
Subjective:   Summary: Jodi Bell is an 62 y.o. female with past medical history significant for hypertension, DM, HLD, gout, CAD status post CABG, CHF, severe aortic stenosis status post recent TAVR on 12/23/2022, COPD, ESRD on HD MWF, presents today because of vomiting and hypotension from the dialysis.    Hospital Day 3  Overnight Events: None  Subjective:  Doing well this morning overall and sitting up in the chair next to her bed.  She has had some intermittent epigastric abdominal pain that was not associated with an episode of nausea and vomiting yesterday during dialysis.  This pain is mild and not described as burning.  She feels like she is about 85% back to her normal self and still improving from admission.  Objective:  Vital signs in last 24 hours: Vitals:   01/04/23 2003 01/04/23 2252 01/05/23 0415 01/05/23 0758  BP: (!) 128/54  (!) 154/61   Pulse: 62 66 66 64  Resp: 18  18 18   Temp: 98.5 F (36.9 C)  98.3 F (36.8 C)   TempSrc: Oral  Oral   SpO2: 100% 98% 97% 94%  Weight:      Height:       Supplemental O2: Nasal Cannula SpO2: 94 % O2 Flow Rate (L/min): 3 L/min FiO2 (%): 32 %   Physical Exam:  Constitutional: Chronically ill-appearing elderly female Cardiovascular: RRR, systolic murmur at the left upper sternal border.  Palpable thrill over left upper extremity AV fistula. Pulmonary/Chest: normal work of breathing Abdominal: soft, non-tender, non-distended Neuro: Alert and oriented x 3, no focal deficits noted Skin: warm and dry Extremities: upper/lower extremity pulses 2+, no lower extremity edema present   Intake/Output Summary (Last 24 hours) at 01/05/2023 0838 Last data filed at 01/04/2023 1248 Gross per 24 hour  Intake --  Output 1600 ml  Net -1600 ml    Net IO Since Admission: -3,001 mL [01/05/23 0838]  Pertinent Labs:    Latest Ref Rng & Units 01/05/2023    4:05 AM 01/04/2023    7:02 AM 01/03/2023    7:45 AM   CBC  WBC 4.0 - 10.5 K/uL 4.0  5.5  5.0   Hemoglobin 12.0 - 15.0 g/dL 7.0  7.6  8.2   Hematocrit 36.0 - 46.0 % 24.6  25.8  27.2   Platelets 150 - 400 K/uL 91  107  100        Latest Ref Rng & Units 01/05/2023    4:05 AM 01/04/2023    7:02 AM 01/03/2023    7:45 AM  CMP  Glucose 70 - 99 mg/dL 161  97  61   BUN 8 - 23 mg/dL 15  31  22    Creatinine 0.44 - 1.00 mg/dL 0.96  0.45  4.09   Sodium 135 - 145 mmol/L 132  133  133   Potassium 3.5 - 5.1 mmol/L 4.3  4.2  3.1   Chloride 98 - 111 mmol/L 98  94  96   CO2 22 - 32 mmol/L 30  28  25    Calcium 8.9 - 10.3 mg/dL 9.2  9.1  8.3     Imaging: No results found.  Assessment/Plan:   Principal Problem:   GI bleed Active Problems:   Essential hypertension   COPD (chronic obstructive pulmonary disease) (HCC)   Acute anemia   ESRD (end stage renal disease) (HCC)   Chronic heart failure (  HCC)   Demand ischemia (HCC)   Thrombocytopenia (HCC)   Respiratory failure with hypoxia and hypercapnia (HCC)   Biventricular congestive heart failure (HCC)   Myocardial injury   Uremia   S/P TAVR (transcatheter aortic valve replacement)   Coronary artery disease involving coronary bypass graft of native heart without angina pectoris   ESRD on dialysis (HCC)   Acute diastolic (congestive) heart failure (HCC)   Right ventricular failure (HCC)   #GI bleeding #Acute on chronic anemia Patient has a history of GI bleeding due to peptic ulcer disease and Dieulafoy lesion, reporting at least one day of dark stools. She was started on daily ASA after recent TAVR on 12/23/22.  Admitted with a mild drop in hemoglobin from 8.2-7.6 without signs of ongoing bleeding and further workup with iron panel and blood smear were consistent with anemia of chronic disease.  GI evaluated the patient and recommended PPI twice daily but no endoscopic workup at this time.  This morning her hemoglobin is 7.0 and nephrology has added a unit of blood for her.  We will check a  posttransfusion H&H and if this is not adequately responding we will consider reconsulting GI. -Continue IV PPI twice daily -Holding anticoagulation and antiplatelet agents -Posttransfusion H&H  #Recurrent left-sided chest pain #Demand ischemia #CAD Patient has a history of CAD s/p CABG x2 with LIMA to LAD and SVG to Diag in 2022.  During admission she did complain of some chest pain that was similar to her pain she had prior to her CABG.  Workup with EKG on admission and at the time of recurrent chest pain included stable EKGs, downtrending troponins, and resolution of her pain with a lidocaine patch. - Continue Imdur and Coreg at home doses - lidocaine patch and if not responsive can add nitroglycerin patch as needed  #ESRD #Hyperkalemia HD per nephro.  #Chronic HFpEF #Aortic stenosis s/p TAVR Continues to be hypervolemic with JVD today.  No schistocytes on blood smear.  Echo after the TAVR showed underexpanded valve due to severe calcification.  Repeat echocardiogram showed improvement in valve function and no reason to consult structural heart during this admission.  # Encephalopathy, most likely uremic, resolved Mental status is back at baseline. - Continue Ativan and discontinue Ambien on discharge  #COPD -Home 2-3L O2 [place] -Triple therapy LAMA/LABA, ICS via neg, q6h duonebs prn  #Thrombocytopenia Long history of thrombocytopenia and fatty liver. Low suspicion for DIC or TTP. Platelet count 100k at admission and stable.  Blood smear without abnormal morphology. -Continue to monitor  #Hypertension -Continue carvedilol 25 mg twice daily, Imdur 60 mg daily - Resume clonidine 0.3 mg patch due to risk of rebound hypertension - Continue to hold olmesartan 40 mg daily, nifedipine 60 mg twice daily, hydralazine 100 mg 3 times daily.  Will add back as tolerated along with nephrology  Diet: Renal diet VTE: SCDs Code: Full  Dispo: Anticipated discharge to Home pending medical  stability.   Rocky Morel, DO Internal Medicine Resident, PGY-2 Pager# [phone] Please contact the on call pager after 5 pm and on weekends at [phone]. 8:38 AM 01/05/2023

## 2023-01-05 NOTE — Progress Notes (Signed)
Rounding Note    Patient Name: Jodi Bell Date of Encounter: 01/05/2023  [facility] Cardiologist: Donato Schultz, MD   Subjective   No CP or dyspnea  Inpatient Medications    Scheduled Meds:  arformoterol  15 mcg Nebulization BID   budesonide (PULMICORT) nebulizer solution  0.25 mg Nebulization BID   carvedilol  25 mg Oral BID WC   Chlorhexidine Gluconate Cloth  6 each Topical Q0600   Chlorhexidine Gluconate Cloth  6 each Topical Q0600   cloNIDine  0.3 mg Transdermal Q Sun   isosorbide mononitrate  60 mg Oral Daily   lidocaine  1 patch Transdermal Q24H   pantoprazole (PROTONIX) IV  40 mg Intravenous Q12H   predniSONE  5 mg Oral Q breakfast   revefenacin  175 mcg Nebulization Daily   Continuous Infusions:  PRN Meds: acetaminophen, ipratropium-albuterol, LORazepam, ondansetron (ZOFRAN) IV **OR** ondansetron   Vital Signs    Vitals:   01/04/23 2003 01/04/23 2252 01/05/23 0415 01/05/23 0758  BP: (!) 128/54  (!) 154/61   Pulse: 62 66 66 64  Resp: 18  18 18   Temp: 98.5 F (36.9 C)  98.3 F (36.8 C)   TempSrc: Oral  Oral   SpO2: 100% 98% 97% 94%  Weight:      Height:        Intake/Output Summary (Last 24 hours) at 01/05/2023 0906 Last data filed at 01/04/2023 1248 Gross per 24 hour  Intake --  Output 1600 ml  Net -1600 ml      01/04/2023   12:49 PM 01/04/2023    9:03 AM 01/03/2023    4:18 PM  Last 3 Weights  Weight (lbs) 141 lb 12.1 oz 145 lb 1 oz 129 lb 6.6 oz  Weight (kg) 64.3 kg 65.8 kg 58.7 kg      Telemetry    Sinus - Personally Reviewed   Physical Exam   GEN: No acute distress.   Neck: No JVD Cardiac: RRR, 2/6 systolic murmur left sternal border. Respiratory: Clear to auscultation bilaterally. GI: Soft, nontender, non-distended  MS: No edema Neuro:  Nonfocal  Psych: Normal affect   Labs    High Sensitivity Troponin:   Recent Labs  Lab 01/02/23 1514 01/02/23 1811 01/04/23 0702 01/04/23 0752  TROPONINIHS 252*  230* 212* 196*     Chemistry Recent Labs  Lab 01/02/23 1811 01/02/23 2045 01/03/23 0745 01/04/23 0702 01/05/23 0405  NA 131*   < > 133* 133* 132*  K 6.2*   < > 3.1* 4.2 4.3  CL 100  --  96* 94* 98  CO2 22  --  25 28 30   GLUCOSE 90  --  61* 97 112*  BUN 51*  --  22 31* 15  CREATININE 8.19*  --  4.55* 6.19* 4.27*  CALCIUM 8.3*  --  8.3* 9.1 9.2  MG  --   --  1.7  --   --   PROT 5.0*  --   --   --   --   ALBUMIN 2.4*  2.4*  --   --   --  2.7*  AST 22  --   --   --   --   ALT 11  --   --   --   --   ALKPHOS 48  --   --   --   --   BILITOT 1.2*  --   --   --   --   GFRNONAA 5*  --  10* 7* 11*  ANIONGAP 9  --  12 11 4*   < > = values in this interval not displayed.   Hematology Recent Labs  Lab 01/03/23 0745 01/04/23 0702 01/05/23 0405  WBC 5.0 5.5 4.0  RBC 2.54* 2.40* 2.26*  HGB 8.2* 7.6* 7.0*  HCT 27.2* 25.8* 24.6*  MCV 107.1* 107.5* 108.8*  MCH 32.3 31.7 31.0  MCHC 30.1 29.5* 28.5*  RDW 15.9* 15.9* 15.5  PLT 100* 107* 91*    Radiology    ECHOCARDIOGRAM COMPLETE  Result Date: 01/04/2023    ECHOCARDIOGRAM REPORT   Patient Name:   Jodi Bell Date of Exam: 01/04/2023 Medical Rec #:  [id number]        Height:       61.0 in Accession #:    [id number]       Weight:       129.4 lb Date of Birth:  February 25, 1960         BSA:          1.570 m Patient Age:    62 years         BP:           150/58 mmHg Patient Gender: F                HR:           65 bpm. Exam Location:  Inpatient Procedure: 2D Echo, Cardiac Doppler and Color Doppler STAT ECHO Indications:    Abnormal ECG  History:        Patient has prior history of Echocardiogram examinations, most                 recent 12/24/2022. CHF, CAD, Prior CABG, COPD, Arrythmias:LBBB                 and Atrial Fibrillation; Risk Factors:Hypertension. TAVR                 12/23/2022.                 Aortic Valve: 23 mm Edwards Sapien 3 Ultra Resilia (TAVR) Sapien                 prosthetic, stented (TAVR) valve is present in the aortic                  position. Procedure Date: 12/23/2022.  Sonographer:    Vern Claude Referring Phys: [id number] JEFFREY C HATCHER IMPRESSIONS  1. The aortic valve has been repaired/replaced. Aortic valve regurgitation is mild. There is a 23 mm Edwards Sapien 3 Ultra Resilia (TAVR) Sapien prosthetic (TAVR) valve present in the aortic position. Procedure Date: 12/23/2022. Aortic valve area, by VTI measures 1.07 cm. Aortic valve mean gradient measures 21.5 mmHg (improved compared to 40 mm Hg on prior Echo). Aortic valve Vmax measures 3.56 m/s. Aortic valve acceleration time measures 126 msec. Aortic valve ejection time measures 320 msec. DVI is 0.49. Findings consistent with patient-prosthesis mismatch and mild regurgitation of aortic prosthesis.  2. Left ventricular ejection fraction, by estimation, is 60 to 65%. The left ventricle has normal function. The left ventricle has no regional wall motion abnormalities. There is moderate concentric left ventricular hypertrophy. Left ventricular diastolic parameters are consistent with Grade II diastolic dysfunction (pseudonormalization). Elevated left ventricular end-diastolic pressure.  3. Right ventricular systolic function is moderately reduced. The right ventricular size is moderately enlarged. There is severely elevated pulmonary artery systolic pressure.  4. Left atrial size was  severely dilated.  5. Right atrial size was moderately dilated.  6. The mitral valve is abnormal. Mild mitral valve regurgitation. Moderate mitral stenosis. The mean mitral valve gradient is 6.0 mmHg. Severe mitral annular calcification.  7. The tricuspid valve is abnormal. Tricuspid valve regurgitation is mild to moderate.  8. The inferior vena cava is dilated in size with <50% respiratory variability, suggesting right atrial pressure of 15 mmHg.  9. Increased flow velocities may be secondary to anemia, thyrotoxicosis, hyperdynamic or high flow state. Comparison(s): Changes from prior study are  noted. Findings consistent with patient-prosthesis mismatch and mild regurgitation of the aortic prosthesis. Ao MPG improved from 40 mm Hg (on prior echo) to 21 mm Hg now. FINDINGS  Left Ventricle: Left ventricular ejection fraction, by estimation, is 60 to 65%. The left ventricle has normal function. The left ventricle has no regional wall motion abnormalities. The left ventricular internal cavity size was normal in size. There is  moderate concentric left ventricular hypertrophy. Left ventricular diastolic parameters are consistent with Grade II diastolic dysfunction (pseudonormalization). Elevated left ventricular end-diastolic pressure. Right Ventricle: The right ventricular size is moderately enlarged. No increase in right ventricular wall thickness. Right ventricular systolic function is moderately reduced. There is severely elevated pulmonary artery systolic pressure. The tricuspid regurgitant velocity is 4.11 m/s, and with an assumed right atrial pressure of 15 mmHg, the estimated right ventricular systolic pressure is 82.6 mmHg. Left Atrium: Left atrial size was severely dilated. Right Atrium: Right atrial size was moderately dilated. Pericardium: There is no evidence of pericardial effusion. Mitral Valve: The mitral valve is abnormal. Severe mitral annular calcification. Mild mitral valve regurgitation. Moderate mitral valve stenosis. MV peak gradient, 14.1 mmHg. The mean mitral valve gradient is 6.0 mmHg. Tricuspid Valve: The tricuspid valve is abnormal. Tricuspid valve regurgitation is mild to moderate. No evidence of tricuspid stenosis. Aortic Valve: The aortic valve has been repaired/replaced. Aortic valve regurgitation is mild. Aortic valve mean gradient measures 21.5 mmHg. Aortic valve peak gradient measures 50.7 mmHg. Aortic valve area, by VTI measures 1.07 cm. There is a 23 mm Edwards Sapien 3 Ultra Resilia (TAVR) Sapien prosthetic, stented (TAVR) valve present in the aortic position. Procedure  Date: 12/23/2022. Pulmonic Valve: The pulmonic valve was normal in structure. Pulmonic valve regurgitation is mild. No evidence of pulmonic stenosis. Aorta: The aortic root and ascending aorta are structurally normal, with no evidence of dilitation. Venous: The inferior vena cava is dilated in size with less than 50% respiratory variability, suggesting right atrial pressure of 15 mmHg. IAS/Shunts: No atrial level shunt detected by color flow Doppler.  LEFT VENTRICLE PLAX 2D LVIDd:         3.50 cm      Diastology LVIDs:         2.10 cm      LV e' medial:    8.05 cm/s LV PW:         1.40 cm      LV E/e' medial:  22.0 LV IVS:        1.30 cm      LV e' lateral:   10.30 cm/s LVOT diam:     1.65 cm      LV E/e' lateral: 17.2 LV SV:         76 LV SV Index:   49 LVOT Area:     2.14 cm  LV Volumes (MOD) LV vol d, MOD A2C: 164.0 ml LV vol d, MOD A4C: 151.0 ml LV vol s, MOD A2C:  56.7 ml LV vol s, MOD A4C: 54.5 ml LV SV MOD A2C:     107.3 ml LV SV MOD A4C:     151.0 ml LV SV MOD BP:      102.5 ml RIGHT VENTRICLE            IVC RV Basal diam:  4.20 cm    IVC diam: 2.50 cm RV Mid diam:    3.40 cm RV S prime:     6.53 cm/s TAPSE (M-mode): 1.0 cm LEFT ATRIUM              Index        RIGHT ATRIUM           Index LA Vol (A2C):   135.0 ml 86.00 ml/m  RA Area:     20.00 cm LA Vol (A4C):   75.8 ml  48.29 ml/m  RA Volume:   58.60 ml  37.33 ml/m LA Biplane Vol: 102.0 ml 64.98 ml/m  AORTIC VALVE                     PULMONIC VALVE AV Area (Vmax):    0.91 cm      PV Vmax:          1.33 m/s AV Area (Vmean):   0.98 cm      PV Peak grad:     7.1 mmHg AV Area (VTI):     1.07 cm      PR End Diast Vel: 9.49 msec AV Vmax:           356.00 cm/s AV Vmean:          211.000 cm/s AV VTI:            0.712 m AV Peak Grad:      50.7 mmHg AV Mean Grad:      21.5 mmHg LVOT Vmax:         151.00 cm/s LVOT Vmean:        97.100 cm/s LVOT VTI:          0.357 m LVOT/AV VTI ratio: 0.50  AORTA Ao Root diam: 2.90 cm Ao Asc diam:  3.00 cm MITRAL VALVE                 TRICUSPID VALVE MV Area (PHT): 3.48 cm     TR Peak grad:   67.6 mmHg MV Area VTI:   1.91 cm     TR Vmax:        411.00 cm/s MV Peak grad:  14.1 mmHg MV Mean grad:  6.0 mmHg     SHUNTS MV Vmax:       1.88 m/s     Systemic VTI:  0.36 m MV Vmean:      109.0 cm/s   Systemic Diam: 1.65 cm MV Decel Time: 218 msec MR Peak grad: 106.5 mmHg MR Mean grad: 59.0 mmHg MR Vmax:      516.00 cm/s MR Vmean:     349.0 cm/s MV E velocity: 177.00 cm/s MV A velocity: 138.00 cm/s MV E/A ratio:  1.28 Vishnu Priya Mallipeddi Electronically signed by Winfield Rast Mallipeddi Signature Date/Time: 01/04/2023/11:13:59 AM    Final      Patient Profile     62 y.o. female with past medical history of coronary artery disease status post coronary bypass and graft, status post recent TAVR with residual elevated gradient, end-stage renal disease dialysis dependent, COPD, hypertension, diabetes mellitus, history of GI bleed being evaluated  for elevated troponin.  Patient is status post TAVR 12/23/2022.  Echocardiogram this admission shows normal LV function, moderate left ventricular hypertrophy, grade 2 diastolic dysfunction, moderate right ventricular enlargement, severe pulmonary hypertension, biatrial enlargement, mild mitral regurgitation, moderate mitral stenosis with mean gradient 6 mmHg, status post aortic valve replacement with mild aortic insufficiency and improve mean gradient of 21.5 mmHg.  Findings felt secondary to patient prosthesis mismatch.  Previous mean gradient 40 mmHg.  Assessment & Plan    1 Mildly elevated troponin-in the setting of end-stage renal disease with no clear trend.  No plans for further ischemia evaluation.  Patient had recent cardiac catheterization prior to TAVR.  2 status post TAVR-initial postprocedure echocardiogram showed significantly elevated mean gradient felt to be patient prosthesis mismatch.  Follow-up echocardiogram this admission shows improvement in that gradient to 21.5 mmHg.  Will  continue to follow.  Continue SBE prophylaxis.  3 acute on chronic diastolic congestive heart failure-manage volume via dialysis.  4 end-stage renal disease-dialysis dependent.  Per nephrology.  5 coronary artery disease status post coronary bypass and graft-intolerant to statins.  Would resume aspirin 81 mg daily at discharge.  6 hypertension-continue present blood pressure medications and follow.  Patient can be discharged on present medications once she completes dialysis.  Will arrange follow-up with APP 2 to 4 weeks after discharge.  Cardiology will sign off.  Please call with questions.  For questions or updates, please contact [facility] Please consult [email or web address] for contact info under        Signed, Olga Millers, MD  01/05/2023, 9:06 AM

## 2023-01-05 NOTE — Care Management Important Message (Signed)
Important Message  Patient Details  Name: Jodi Bell MRN: [id number] Date of Birth: 10-30-1960   Important Message Given:  Yes - Medicare IM     Dorena Bodo 01/05/2023, 3:36 PM

## 2023-01-06 DIAGNOSIS — D649 Anemia, unspecified: Secondary | ICD-10-CM | POA: Diagnosis not present

## 2023-01-06 DIAGNOSIS — R079 Chest pain, unspecified: Secondary | ICD-10-CM | POA: Diagnosis not present

## 2023-01-06 DIAGNOSIS — K922 Gastrointestinal hemorrhage, unspecified: Secondary | ICD-10-CM | POA: Diagnosis not present

## 2023-01-06 DIAGNOSIS — I120 Hypertensive chronic kidney disease with stage 5 chronic kidney disease or end stage renal disease: Secondary | ICD-10-CM | POA: Diagnosis not present

## 2023-01-06 LAB — CBC
HCT: 24.9 % — ABNORMAL LOW (ref 36.0–46.0)
Hemoglobin: 7.1 g/dL — ABNORMAL LOW (ref 12.0–15.0)
MCH: 31 pg (ref 26.0–34.0)
MCHC: 28.5 g/dL — ABNORMAL LOW (ref 30.0–36.0)
MCV: 108.7 fL — ABNORMAL HIGH (ref 80.0–100.0)
Platelets: 101 10*3/uL — ABNORMAL LOW (ref 150–400)
RBC: 2.29 MIL/uL — ABNORMAL LOW (ref 3.87–5.11)
RDW: 15.4 % (ref 11.5–15.5)
WBC: 4.2 10*3/uL (ref 4.0–10.5)
nRBC: 0 % (ref 0.0–0.2)

## 2023-01-06 LAB — RENAL FUNCTION PANEL
Albumin: 2.8 g/dL — ABNORMAL LOW (ref 3.5–5.0)
Anion gap: 8 (ref 5–15)
BUN: 21 mg/dL (ref 8–23)
CO2: 27 mmol/L (ref 22–32)
Calcium: 9.3 mg/dL (ref 8.9–10.3)
Chloride: 97 mmol/L — ABNORMAL LOW (ref 98–111)
Creatinine, Ser: 5.4 mg/dL — ABNORMAL HIGH (ref 0.44–1.00)
GFR, Estimated: 8 mL/min — ABNORMAL LOW (ref >60)
Glucose, Bld: 120 mg/dL — ABNORMAL HIGH (ref 70–99)
Phosphorus: 4.5 mg/dL (ref 2.5–4.6)
Potassium: 4.4 mmol/L (ref 3.5–5.1)
Sodium: 132 mmol/L — ABNORMAL LOW (ref 135–145)

## 2023-01-06 LAB — GC/CHLAMYDIA PROBE AMP (~~LOC~~) NOT AT ARMC
Chlamydia: NEGATIVE
Comment: NEGATIVE
Comment: NORMAL
Neisseria Gonorrhea: NEGATIVE

## 2023-01-06 LAB — WET PREP, GENITAL
Clue Cells Wet Prep HPF POC: NONE SEEN
Sperm: NONE SEEN
Trich, Wet Prep: NONE SEEN
WBC, Wet Prep HPF POC: <10 (ref <10)
Yeast Wet Prep HPF POC: NONE SEEN

## 2023-01-06 MED ORDER — HEPARIN SODIUM (PORCINE) 1000 UNIT/ML DIALYSIS: 1000.0000 [IU] | INTRAMUSCULAR | Status: DC | PRN

## 2023-01-06 MED ORDER — LIDOCAINE-PRILOCAINE 2.5-2.5 % EX CREA: 1.0000 | TOPICAL_CREAM | CUTANEOUS | Status: DC | PRN

## 2023-01-06 MED ORDER — PENTAFLUOROPROP-TETRAFLUOROETH EX AERO: 1.0000 | INHALATION_SPRAY | CUTANEOUS | Status: DC | PRN

## 2023-01-06 MED ORDER — ANTICOAGULANT SODIUM CITRATE 4% (200MG/5ML) IV SOLN: 5.0000 mL | Status: DC | PRN

## 2023-01-06 MED ORDER — ESTROGENS CONJUGATED 0.625 MG/GM VA CREA
1.0000 | TOPICAL_CREAM | Freq: Every day | VAGINAL | Status: DC
  Administered 2023-01-06: 1 via VAGINAL
  Filled 2023-01-06: qty 30

## 2023-01-06 MED ORDER — LIDOCAINE HCL (PF) 1 % IJ SOLN: 5.0000 mL | INTRAMUSCULAR | Status: DC | PRN

## 2023-01-06 MED ORDER — BUSPIRONE HCL 5 MG PO TABS
5.0000 mg | ORAL_TABLET | Freq: Three times a day (TID) | ORAL | Status: DC
  Administered 2023-01-06 – 2023-01-07 (×4): 5 mg via ORAL
  Filled 2023-01-06 (×5): qty 1

## 2023-01-06 NOTE — Evaluation (Addendum)
Physical Therapy Evaluation Patient Details Name: Jodi Bell MRN: [id number] DOB: Jul 11, 1960 Today's Date: 01/06/2023  History of Present Illness  62 y.o. female presents to [facility] hospital on 01/02/2023 with vomiting and hypotension from dialysis. Pt admitted with concerns for GIB. PMH includes: DM II, CAD s/p CABG in 2022, anxiety, arthritis, COPD, ESRD on HD, GERD, gout, obesity, PAD, and HTN.  Clinical Impression  Pt presents to PT with deficits in activity tolerance, gait, balance, power. Pt is able to transfer and ambulate for short household distances, however she reports lightheadedness during session which limits her activity tolerance. All VSS when quickly checked by this PT. Pt will benefit from frequent mobilization in an effort to improve activity tolerance and reduce falls risk. PT recommends home health PT follow-up.        If plan is discharge home, recommend the following: A little help with bathing/dressing/bathroom;Assistance with cooking/housework;Assist for transportation;Help with stairs or ramp for entrance   Can travel by private vehicle        Equipment Recommendations None recommended by PT  Recommendations for Other Services       Functional Status Assessment Patient has had a recent decline in their functional status and demonstrates the ability to make significant improvements in function in a reasonable and predictable amount of time.     Precautions / Restrictions Precautions Precautions: Fall Restrictions Weight Bearing Restrictions: No      Mobility  Bed Mobility Overal bed mobility: Modified Independent                  Transfers Overall transfer level: Needs assistance Equipment used: Rollator (4 wheels) Transfers: Sit to/from Stand Sit to Stand: Supervision                Ambulation/Gait Ambulation/Gait assistance: Supervision Gait Distance (Feet): 60 Feet Assistive device: Rollator (4 wheels) Gait Pattern/deviations:  Step-through pattern Gait velocity: reduced Gait velocity interpretation: <1.8 ft/sec, indicate of risk for recurrent falls   General Gait Details: slowed step-through gait  Stairs            Wheelchair Mobility     Tilt Bed    Modified Rankin (Stroke Patients Only)       Balance Overall balance assessment: Needs assistance Sitting-balance support: No upper extremity supported, Feet supported Sitting balance-Leahy Scale: Good     Standing balance support: Single extremity supported, Reliant on assistive device for balance Standing balance-Leahy Scale: Poor                               Pertinent Vitals/Pain Pain Assessment Pain Assessment: Faces Faces Pain Scale: Hurts little more Pain Location: R ankle Pain Descriptors / Indicators: Sore Pain Intervention(s): Monitored during session    Home Living Family/patient expects to be discharged to:: Private residence Living Arrangements: Children Available Help at Discharge: Family;Available PRN/intermittently Type of Home: House Home Access: Level entry     Alternate Level Stairs-Number of Steps: 13 Home Layout: Multi-level;Able to live on main level with bedroom/bathroom Home Equipment: Cane - single point;Rolling Walker (2 wheels);Rollator (4 wheels);Shower seat;Toilet riser;Grab bars - tub/shower;Hand held shower head      Prior Function Prior Level of Function : Independent/Modified Independent;Driving             Mobility Comments: ambulating with rollator more often than not       Extremity/Trunk Assessment   Upper Extremity Assessment Upper Extremity Assessment: Overall WFL for tasks  assessed    Lower Extremity Assessment Lower Extremity Assessment: Generalized weakness    Cervical / Trunk Assessment Cervical / Trunk Assessment: Kyphotic  Communication   Communication Communication: No apparent difficulties Cueing Techniques: Verbal cues  Cognition Arousal:  Alert Behavior During Therapy: WFL for tasks assessed/performed Overall Cognitive Status: Within Functional Limits for tasks assessed                                          General Comments General comments (skin integrity, edema, etc.): VSS on 3L [place], pt reports lightheadedness when ambulating, also cites some tunnel vision. BP 134/47 (72), HR 61, SpO2 100%.    Exercises     Assessment/Plan    PT Assessment Patient needs continued PT services  PT Problem List Decreased strength;Decreased activity tolerance;Decreased balance;Decreased mobility;Decreased knowledge of use of DME       PT Treatment Interventions DME instruction;Gait training;Stair training;Functional mobility training;Therapeutic activities;Balance training;Neuromuscular re-education;Patient/family education    PT Goals (Current goals can be found in the Care Plan section)  Acute Rehab PT Goals Patient Stated Goal: to manage the stairs with more ease PT Goal Formulation: With patient Time For Goal Achievement: 01/20/23 Potential to Achieve Goals: Fair    Frequency Min 1X/week     Co-evaluation               AM-PAC PT "6 Clicks" Mobility  Outcome Measure Help needed turning from your back to your side while in a flat bed without using bedrails?: None Help needed moving from lying on your back to sitting on the side of a flat bed without using bedrails?: None Help needed moving to and from a bed to a chair (including a wheelchair)?: A Little Help needed standing up from a chair using your arms (e.g., wheelchair or bedside chair)?: A Little Help needed to walk in hospital room?: A Little Help needed climbing 3-5 steps with a railing? : A Lot 6 Click Score: 19    End of Session Equipment Utilized During Treatment: Oxygen Activity Tolerance: Patient tolerated treatment well Patient left: in bed;with call bell/phone within reach Nurse Communication: Mobility status PT Visit Diagnosis:  Other abnormalities of gait and mobility (R26.89);Muscle weakness (generalized) (M62.81)    Time: 8657-8469 PT Time Calculation (min) (ACUTE ONLY): 25 min   Charges:   PT Evaluation $PT Eval Low Complexity: 1 Low   PT General Charges $$ ACUTE PT VISIT: 1 Visit         Arlyss Gandy, PT, DPT Acute Rehabilitation Office [phone]   Arlyss Gandy 01/06/2023, 3:10 PM

## 2023-01-06 NOTE — Progress Notes (Signed)
PT Cancellation Note  Patient Details Name: Jodi Bell MRN: [id number] DOB: Mar 16, 1960   Cancelled Treatment:    Reason Eval/Treat Not Completed: Patient at procedure or test/unavailable. Pt off the floor at HD. PT to return as able to complete PT eval.  Lewis Shock, PT, DPT Acute Rehabilitation Services Secure chat preferred Office #: [phone]    Iona Hansen 01/06/2023, 9:05 AM

## 2023-01-06 NOTE — Progress Notes (Signed)
Received patient in bed to unit.  Alert and oriented.  Informed consent signed and in chart.   TX duration:3.75 hours  Patient tolerated well.  Transported back to the room  Alert, without acute distress.  Hand-off given to patient's nurse.   Access used: L Upper Arm fistula Access issues: none  Total UF removed: 3L Medication(s) given: 1 Unit of Blood, Tylenol, Ativan, Zofran   01/06/23 1212  Vitals  Temp 98 F (36.7 C)  BP (!) 158/67  Pulse Rate 67  Resp 16  Oxygen Therapy  SpO2 100 %  O2 Device Nasal Cannula  Patient Activity (if Appropriate) In bed  During Treatment Monitoring  Duration of HD Treatment -hour(s) 3.75 hour(s)  HD Safety Checks Performed Yes  Intra-Hemodialysis Comments Tx completed  Dialysis Fluid Bolus Normal Saline  Bolus Amount (mL) 300 mL  Post Treatment  Liters Processed 90  Fluid Removed (mL) 3000 mL  Tolerated HD Treatment Yes  AVG/AVF Arterial Site Held (minutes) 15 minutes  AVG/AVF Venous Site Held (minutes) 15 minutes  Fistula / Graft Left Upper arm  Placement Date/Time: 05/17/18 0800   Orientation: Left  Access Location: Upper arm  Status Deaccessed     Stacie Glaze LPN [facility]

## 2023-01-06 NOTE — Progress Notes (Signed)
[facility] Progress Note   Subjective: Does feel well today. Has a plethora of minor C/Os such as anxiety, nausea and gas. Denies chest pain. Transfuse 1 unit PRBCs today.    Objective Vitals:   01/06/23 0758 01/06/23 0809 01/06/23 0830 01/06/23 0900  BP: (!) 150/56 (!) 177/53 (!) 183/80 (!) 177/126  Pulse: (!) 58 (!) 52 62 71  Resp: 18 (!) 21 18 (!) 21  Temp: 97.9 F (36.6 C)     TempSrc:      SpO2: 100% 99% 100% 98%  Weight: 71.2 kg     Height:       Physical Exam General: Pleasant older female in NAD Heart: S1,S2 RRR 2/6 SM Lungs: CTAB A/P Abdomen: NABS NT Extremities: No LE edema Dialysis Access: L AVF +T/B  Additional Objective Labs: Basic Metabolic Panel: Recent Labs  Lab 01/03/23 0745 01/04/23 0702 01/05/23 0405 01/06/23 0435  NA 133* 133* 132* 132*  K 3.1* 4.2 4.3 4.4  CL 96* 94* 98 97*  CO2 25 28 30 27   GLUCOSE 61* 97 112* 120*  BUN 22 31* 15 21  CREATININE 4.55* 6.19* 4.27* 5.40*  CALCIUM 8.3* 9.1 9.2 9.3  PHOS 2.4*  --  4.0 4.5   Liver Function Tests: Recent Labs  Lab 01/02/23 1811 01/05/23 0405 01/06/23 0435  AST 22  --   --   ALT 11  --   --   ALKPHOS 48  --   --   BILITOT 1.2*  --   --   PROT 5.0*  --   --   ALBUMIN 2.4*  2.4* 2.7* 2.8*   No results for input(s): "LIPASE", "AMYLASE" in the last 168 hours. CBC: Recent Labs  Lab 01/02/23 1514 01/02/23 1600 01/03/23 0745 01/04/23 0702 01/05/23 0405 01/06/23 0435  WBC 6.0  --  5.0 5.5 4.0 4.2  NEUTROABS  --   --  3.0  --   --   --   HGB 8.2*   < > 8.2* 7.6* 7.0* 7.1*  HCT 28.8*   < > 27.2* 25.8* 24.6* 24.9*  MCV 108.7*  --  107.1* 107.5* 108.8* 108.7*  PLT 121*  --  100* 107* 91* 101*   < > = values in this interval not displayed.   Blood Culture    Component Value Date/Time   SDES BLOOD RIGHT FOREARM 01/03/2023 1009   SPECREQUEST  01/03/2023 1009    BOTTLES DRAWN AEROBIC AND ANAEROBIC Blood Culture adequate volume   CULT  01/03/2023 1009    NO GROWTH 3  DAYS Performed at [facility], [address] [place], [place] [address]    REPTSTATUS PENDING 01/03/2023 1009    Cardiac Enzymes: No results for input(s): "CKTOTAL", "CKMB", "CKMBINDEX", "TROPONINI" in the last 168 hours. CBG: Recent Labs  Lab 01/02/23 1410 01/03/23 0132  GLUCAP 103* 89   Iron Studies: No results for input(s): "IRON", "TIBC", "TRANSFERRIN", "FERRITIN" in the last 72 hours. @lablastinr3 @ Studies/Results: No results found. Medications:  anticoagulant sodium citrate      sodium chloride   Intravenous Once   arformoterol  15 mcg Nebulization BID   budesonide (PULMICORT) nebulizer solution  0.25 mg Nebulization BID   carvedilol  25 mg Oral BID WC   cloNIDine  0.3 mg Transdermal Q Sun   isosorbide mononitrate  60 mg Oral Daily   lidocaine  1 patch Transdermal Q24H   pantoprazole (PROTONIX) IV  40 mg Intravenous Q12H   predniSONE  5 mg Oral  Q breakfast   revefenacin  175 mcg Nebulization Daily     OP HD: [facility] MWF [address]  from 12/25/22 (needs updating) --> 3.5h  58.5kg  400/600  2K/3Ca bath  Hep none  L AVF      Assessment/Plan:   # Acute on chronic hypoxic/hypercapnic respiratory failure - with pulmonary edema on x-ray. On BiPAP initially. Had HD Friday night w/ 1.5 L off then was on 2-3 L [place]. Had HD today (Sunday) w/ 1.6 L off . On 2 L [employer]. No vol excess on exam.    # ESRD: MWF HD w/ [facility]. Has missed dialysis. Has had HD x 2 her as above, including today (Sunday HD for holiday schedule). Next HD 01/09/2023    # Hypertension/volume: She is supposed to be on multiple antihypertensives.  BP's were soft but now are normal. BP meds have been resumed. Still above OP EDW. UF as tolerated to optimize volume.    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 HGB 7.1. Transfuse in HD 01/06/2023   # CKD-metabolic Bone Disease:PO4 at goal. Corrected calcium high. No VDRA in orders. Change to 2.0 Ca bath.   Javohn Basey H. Bion Todorov NP-C 01/06/2023,  9:20 AM  [employer] [phone]

## 2023-01-06 NOTE — Evaluation (Signed)
Occupational Therapy Evaluation Patient Details Name: Jodi Bell MRN: [id number] DOB: Apr 09, 1960 Today's Date: 01/06/2023   History of Present Illness 62 y.o. female presents to [facility] hospital on 01/02/2023 with vomiting and hypotension from dialysis. Pt admitted with concerns for GIB. PMH includes: DM II, CAD s/p CABG in 2022, anxiety, arthritis, COPD, ESRD on HD, GERD, gout, obesity, PAD, and HTN.   Clinical Impression   Pt reports ind at baseline with ADLs, although has gotten increasingly difficulty due to decr activity tolerance, has also been using  a rollator for the past week but typically ambulates without AD. Pt lives with daughter and son in law. Pt currently needing set up -min A for ADLs, and supervision for transfers without AD. Pt up in bathroom upon arrival, able to perform seated bath and standing grooming task during session, VSS on baseline 3L O2 throughout. Pt presenting with impairments listed below, will follow acutely. Recommend HHOT at d/c.        If plan is discharge home, recommend the following: A little help with walking and/or transfers;A little help with bathing/dressing/bathroom;Assistance with cooking/housework;Direct supervision/assist for financial management;Direct supervision/assist for medications management;Assist for transportation;Supervision due to cognitive status    Functional Status Assessment  Patient has had a recent decline in their functional status and demonstrates the ability to make significant improvements in function in a reasonable and predictable amount of time.  Equipment Recommendations  None recommended by OT    Recommendations for Other Services PT consult     Precautions / Restrictions Precautions Precautions: Fall Precaution Comments: 3L O2 baseline Restrictions Weight Bearing Restrictions: No      Mobility Bed Mobility               General bed mobility comments: OOB in bathroom upon arrival     Transfers Overall transfer level: Needs assistance Equipment used: None Transfers: Sit to/from Stand Sit to Stand: Supervision                  Balance Overall balance assessment: Needs assistance Sitting-balance support: No upper extremity supported, Feet supported Sitting balance-Leahy Scale: Good     Standing balance support: During functional activity Standing balance-Leahy Scale: Fair Standing balance comment: static standing at sink and short distance ambulation without AD, up ad lib in room upon arrival                           ADL either performed or assessed with clinical judgement   ADL Overall ADL's : Needs assistance/impaired Eating/Feeding: Set up   Grooming: Set up   Upper Body Bathing: Set up   Lower Body Bathing: Set up   Upper Body Dressing : Minimal assistance   Lower Body Dressing: Supervision/safety   Toilet Transfer: Supervision/safety   Toileting- Clothing Manipulation and Hygiene: Supervision/safety       Functional mobility during ADLs: Supervision/safety       Vision   Vision Assessment?: No apparent visual deficits     Perception Perception: Not tested       Praxis Praxis: Not tested       Pertinent Vitals/Pain Pain Assessment Pain Assessment: No/denies pain     Extremity/Trunk Assessment Upper Extremity Assessment Upper Extremity Assessment: Overall WFL for tasks assessed   Lower Extremity Assessment Lower Extremity Assessment: Defer to PT evaluation   Cervical / Trunk Assessment Cervical / Trunk Assessment: Kyphotic   Communication Communication Communication: No apparent difficulties Cueing Techniques: Verbal cues  Cognition Arousal: Alert Behavior During Therapy: WFL for tasks assessed/performed Overall Cognitive Status: Within Functional Limits for tasks assessed                                       General Comments  VSS on 3L O2    Exercises     Shoulder  Instructions      Home Living Family/patient expects to be discharged to:: Private residence Living Arrangements: Children (daughter and son in law) Available Help at Discharge: Family;Available PRN/intermittently Type of Home: House Home Access: Level entry     Home Layout: Multi-level;Able to live on main level with bedroom/bathroom Alternate Level Stairs-Number of Steps: 13 Alternate Level Stairs-Rails: Left Bathroom Shower/Tub: Tub/shower unit   Bathroom Toilet: Standard Bathroom Accessibility: Yes   Home Equipment: Cane - single point;Rolling Walker (2 wheels);Rollator (4 wheels);Shower seat;Toilet riser;Grab bars - tub/shower;Hand held shower head   Additional Comments: occasionally goes upstairs to kitchen, but mainly stays on main floor. Wears 3L O2 baseline at home      Prior Functioning/Environment Prior Level of Function : Independent/Modified Independent             Mobility Comments: family takes pt to HD, uses cane outside the home, has been using Rollator in the house the past few days 2/2 decr activity tolerance ADLs Comments: daughter does pt's laundry, sits for showers, reports ind with ADLs but has difficulty finding the energy for it. Hasn't driven since TAVR        OT Problem List: Decreased strength;Decreased range of motion;Decreased activity tolerance      OT Treatment/Interventions: Self-care/ADL training;Therapeutic exercise;Energy conservation;DME and/or AE instruction;Therapeutic activities;Balance training;Patient/family education    OT Goals(Current goals can be found in the care plan section) Acute Rehab OT Goals Patient Stated Goal: none stated OT Goal Formulation: With patient Time For Goal Achievement: 01/20/23 Potential to Achieve Goals: Good ADL Goals Pt Will Perform Upper Body Dressing: Independently Pt Will Perform Lower Body Dressing: Independently Pt Will Transfer to Toilet: Independently;ambulating;regular height toilet Pt  Will Perform Tub/Shower Transfer: Tub transfer;Shower transfer;Independently;ambulating;shower seat Additional ADL Goal #1: pt will verbalize x3 energy conservation strategies in prep for ADLs  OT Frequency: Min 1X/week    Co-evaluation              AM-PAC OT "6 Clicks" Daily Activity     Outcome Measure Help from another person eating meals?: None Help from another person taking care of personal grooming?: A Little Help from another person toileting, which includes using toliet, bedpan, or urinal?: A Little Help from another person bathing (including washing, rinsing, drying)?: A Little Help from another person to put on and taking off regular upper body clothing?: A Little Help from another person to put on and taking off regular lower body clothing?: A Little 6 Click Score: 19   End of Session Equipment Utilized During Treatment: Oxygen (3L) Nurse Communication: Mobility status  Activity Tolerance: Patient tolerated treatment well Patient left: Other (comment) (seated on couch in room, RN aware)  OT Visit Diagnosis: Unsteadiness on feet (R26.81);Other abnormalities of gait and mobility (R26.89);Muscle weakness (generalized) (M62.81)                Time: 6578-4696 OT Time Calculation (min): 35 min Charges:  OT General Charges $OT Visit: 1 Visit OT Evaluation $OT Eval Moderate Complexity: 1 Mod OT Treatments $Self Care/Home Management : 8-22 mins  Lebron Quam, OTR/L SecureChat Preferred Acute Rehab [phone]   Dalphine Handing 01/06/2023, 4:22 PM

## 2023-01-06 NOTE — Progress Notes (Signed)
Subjective:   Summary: Jodi Bell is an 62 y.o. female with past medical history significant for hypertension, DM, HLD, gout, CAD status post CABG, CHF, severe aortic stenosis status post recent TAVR on 12/23/2022, COPD, ESRD on HD MWF, presents today because of vomiting and hypotension from the dialysis.    Hospital Day 4  Overnight Events: None  Subjective:  Saw patient during dialysis session. She reports some nausea after eating her breakfast this morning, but no vomiting. She continues to have mild epigastric pain. She has been seen by outpatient GI in the past but not recently. She feels better after every dialysis session, and is hoping she will feel better by tomorrow so she can go home.  Objective:  Vital signs in last 24 hours: Vitals:   01/06/23 1000 01/06/23 1015 01/06/23 1030 01/06/23 1100  BP: (!) 178/66 (!) 161/69 (!) 161/69 (!) 144/55  Pulse: (!) 140 66 66 66  Resp: 15 20 20 16   Temp:  97.8 F (36.6 C)    TempSrc:      SpO2: 100% 99% 100% 100%  Weight:      Height:       Supplemental O2: Nasal Cannula SpO2: 100 % O2 Flow Rate (L/min): 3 L/min FiO2 (%): 32 %   Physical Exam:  Constitutional: Chronically ill-appearing elderly female Cardiovascular: RRR, systolic murmur at the left upper sternal border.  Palpable thrill over left upper extremity AV fistula. Pulmonary/Chest: normal work of breathing Abdominal: soft, non-tender, non-distended Neuro: Alert and oriented x 3, no focal deficits noted Skin: warm and dry Extremities: upper/lower extremity pulses 2+, no lower extremity edema present   Intake/Output Summary (Last 24 hours) at 01/06/2023 1117 Last data filed at 01/06/2023 1029 Gross per 24 hour  Intake 516.67 ml  Output 0 ml  Net 516.67 ml    Net IO Since Admission: -2,484.33 mL [01/06/23 1117]  Pertinent Labs:    Latest Ref Rng & Units 01/06/2023    4:35 AM 01/05/2023    4:05 AM 01/04/2023    7:02 AM  CBC  WBC  4.0 - 10.5 K/uL 4.2  4.0  5.5   Hemoglobin 12.0 - 15.0 g/dL 7.1  7.0  7.6   Hematocrit 36.0 - 46.0 % 24.9  24.6  25.8   Platelets 150 - 400 K/uL 101  91  107        Latest Ref Rng & Units 01/06/2023    4:35 AM 01/05/2023    4:05 AM 01/04/2023    7:02 AM  CMP  Glucose 70 - 99 mg/dL 284  132  97   BUN 8 - 23 mg/dL 21  15  31    Creatinine 0.44 - 1.00 mg/dL 4.40  1.02  7.25   Sodium 135 - 145 mmol/L 132  132  133   Potassium 3.5 - 5.1 mmol/L 4.4  4.3  4.2   Chloride 98 - 111 mmol/L 97  98  94   CO2 22 - 32 mmol/L 27  30  28    Calcium 8.9 - 10.3 mg/dL 9.3  9.2  9.1     Imaging: No results found.  Assessment/Plan:   Principal Problem:   GI bleed Active Problems:   Essential hypertension   COPD (chronic obstructive pulmonary disease) (HCC)   Acute anemia   ESRD (end stage renal disease) (HCC)   Chronic heart failure (HCC)   Demand ischemia (HCC)  Thrombocytopenia (HCC)   Respiratory failure with hypoxia and hypercapnia (HCC)   Biventricular congestive heart failure (HCC)   Myocardial injury   Uremia   S/P TAVR (transcatheter aortic valve replacement)   Coronary artery disease involving coronary bypass graft of native heart without angina pectoris   ESRD on dialysis (HCC)   Acute diastolic (congestive) heart failure (HCC)   Right ventricular failure (HCC)   #GI bleeding #Acute on chronic anemia Patient has a history of GI bleeding due to peptic ulcer disease and Dieulafoy lesion, reporting at least one day of dark stools. She was started on daily ASA after recent TAVR on 12/23/22.  Admitted with a mild drop in hemoglobin from 8.2-7.6 without signs of ongoing bleeding and further workup with iron panel and blood smear were consistent with anemia of chronic disease.  GI evaluated the patient and recommended PPI twice daily but no endoscopic workup at this time.  This morning her hemoglobin is 7.1. Will consider reconsulting GI if Hgb continues to be unstable.  -Continue IV  PPI twice daily -Holding anticoagulation and antiplatelet agents -Posttransfusion H&H  #Recurrent left-sided chest pain #Demand ischemia #CAD Patient has a history of CAD s/p CABG x2 with LIMA to LAD and SVG to Diag in 2022.  During admission she did complain of some chest pain that was similar to her pain she had prior to her CABG.  Workup with EKG on admission and at the time of recurrent chest pain included stable EKGs, downtrending troponins, and resolution of her pain with a lidocaine patch. - Continue Imdur and Coreg at home doses - lidocaine patch and if not responsive can add nitroglycerin patch as needed  #ESRD #Hyperkalemia HD per nephro.  #Chronic HFpEF #Aortic stenosis s/p TAVR Continues to be hypervolemic with JVD today.  No schistocytes on blood smear.  Echo after the TAVR showed underexpanded valve due to severe calcification.  Repeat echocardiogram showed improvement in valve function and no reason to consult structural heart during this admission.  # Encephalopathy, most likely uremic, resolved Mental status is back at baseline. - Continue Ativan and discontinue Ambien on discharge  #Vaginal itching, discharge Has had vaginal itching and some discharge. Wet prep negative. Patient wishes to be examined, will return this afternoon for vaginal exam and reperform wet prep  #Hiatal hernia Small unchanged hiatal hernia, palpable on abdominal exam. Unlikely to be causing nausea or vomiting at this time. She has seen GI in the past but not in some time. Recommend outpatient GI follow up.   #COPD -Home 2-3L O2 [place] -Triple therapy LAMA/LABA, ICS via neg, q6h duonebs prn  #Thrombocytopenia Long history of thrombocytopenia and fatty liver. Low suspicion for DIC or TTP. Platelet count 100k at admission and stable.  Blood smear without abnormal morphology. - Continue to monitor  #Hypertension -Continue carvedilol 25 mg twice daily, Imdur 60 mg daily - Resume clonidine 0.3 mg  patch due to risk of rebound hypertension - Continue to hold olmesartan 40 mg daily, nifedipine 60 mg twice daily, hydralazine 100 mg 3 times daily.  Will add back as tolerated along with nephrology  Diet: Renal diet VTE: SCDs Code: Full  Dispo: Anticipated discharge to Home pending medical stability.   Monna Fam, MD PGY1 Pager# [phone] Please contact the on call pager after 5 pm and on weekends at [phone]. 11:17 AM 01/06/2023

## 2023-01-06 NOTE — Progress Notes (Signed)
Patient refused bed alarm. Pt was advised to call for assistance before getting out of bed. Pt verbalized understanding. Call bell within reach.

## 2023-01-07 ENCOUNTER — Other Ambulatory Visit (HOSPITAL_COMMUNITY): Payer: Self-pay

## 2023-01-07 DIAGNOSIS — R079 Chest pain, unspecified: Secondary | ICD-10-CM | POA: Diagnosis not present

## 2023-01-07 DIAGNOSIS — D649 Anemia, unspecified: Secondary | ICD-10-CM | POA: Diagnosis not present

## 2023-01-07 DIAGNOSIS — I120 Hypertensive chronic kidney disease with stage 5 chronic kidney disease or end stage renal disease: Secondary | ICD-10-CM | POA: Diagnosis not present

## 2023-01-07 DIAGNOSIS — K922 Gastrointestinal hemorrhage, unspecified: Secondary | ICD-10-CM | POA: Diagnosis not present

## 2023-01-07 LAB — HEPATITIS B SURFACE ANTIBODY, QUANTITATIVE: Hep B S AB Quant (Post): 10.5 m[IU]/mL

## 2023-01-07 LAB — CBC
HCT: 28.7 % — ABNORMAL LOW (ref 36.0–46.0)
Hemoglobin: 8.4 g/dL — ABNORMAL LOW (ref 12.0–15.0)
MCH: 31.1 pg (ref 26.0–34.0)
MCHC: 29.3 g/dL — ABNORMAL LOW (ref 30.0–36.0)
MCV: 106.3 fL — ABNORMAL HIGH (ref 80.0–100.0)
Platelets: 90 10*3/uL — ABNORMAL LOW (ref 150–400)
RBC: 2.7 MIL/uL — ABNORMAL LOW (ref 3.87–5.11)
RDW: 18.2 % — ABNORMAL HIGH (ref 11.5–15.5)
WBC: 4.9 10*3/uL (ref 4.0–10.5)
nRBC: 0 % (ref 0.0–0.2)

## 2023-01-07 LAB — BASIC METABOLIC PANEL
Anion gap: 6 (ref 5–15)
BUN: 11 mg/dL (ref 8–23)
CO2: 30 mmol/L (ref 22–32)
Calcium: 9.5 mg/dL (ref 8.9–10.3)
Chloride: 97 mmol/L — ABNORMAL LOW (ref 98–111)
Creatinine, Ser: 3.82 mg/dL — ABNORMAL HIGH (ref 0.44–1.00)
GFR, Estimated: 13 mL/min — ABNORMAL LOW (ref >60)
Glucose, Bld: 92 mg/dL (ref 70–99)
Potassium: 4.3 mmol/L (ref 3.5–5.1)
Sodium: 133 mmol/L — ABNORMAL LOW (ref 135–145)

## 2023-01-07 MED ORDER — IRBESARTAN 300 MG PO TABS
300.0000 mg | ORAL_TABLET | Freq: Every day | ORAL | Status: DC
  Administered 2023-01-07: 300 mg via ORAL
  Filled 2023-01-07: qty 1

## 2023-01-07 MED ORDER — ASPIRIN 81 MG PO CHEW: 81.0000 mg | CHEWABLE_TABLET | ORAL | Status: DC

## 2023-01-07 MED ORDER — ZINC OXIDE 20 % EX OINT
TOPICAL_OINTMENT | Freq: Every day | CUTANEOUS | 1 refills | Status: DC | PRN
  Filled 2023-01-07: qty 240, fill #0

## 2023-01-07 MED ORDER — ESTROGENS CONJUGATED 0.625 MG/GM VA CREA: TOPICAL_CREAM | Freq: Every day | VAGINAL | 1 refills | Status: DC | PRN

## 2023-01-07 NOTE — Discharge Instructions (Addendum)
Ms Ronya, Drain were hospitalized for nausea and vomiting, a GI bleeding, and dialysis needs. At this time I feel comfortable discharging you from the hospital. Thank you for allowing [place] to be part of your care.   Please make an appointment with your PCP within the next 1-2 weeks. You also have an appointment with Cardiology, Cline Crock, PA-C 02/02/23 at 9:30am [address]  [facility] Dec 19, 9:45am   Please note these changes made to your medications:   Please continue to take your Aspirin, but only once every other day  Continue to use the topical estrogen cream for vaginal dryness  *Please STOP taking:  Hydralazine  Please make sure to return to the hospital if you are having worsening shortness of breath or mental confusion  Please call our clinic if you have any questions or concerns, we may be able to help and keep you from a long and expensive emergency room wait. Our clinic and after hours phone number is [phone], the best time to call is Monday through Friday 9 am to 4 pm but there is always someone available 24/7 if you have an emergency. If you need medication refills please notify your pharmacy one week in advance and they will send [place] a request.

## 2023-01-07 NOTE — Progress Notes (Addendum)
Occupational Therapy Treatment Patient Details Name: Jodi Bell MRN: [id number] DOB: 20-Jun-1960 Today's Date: 01/07/2023   History of present illness 62 y.o. female presents to [facility] hospital on 01/02/2023 with vomiting and hypotension from dialysis. Pt admitted with concerns for GIB. PMH includes: DM II, CAD s/p CABG in 2022, anxiety, arthritis, COPD, ESRD on HD, GERD, gout, obesity, PAD, and HTN.   OT comments  Pt with slow progression towards goals, limited by pain this session. Declines mobility as she reports she was just OOB to use bathroom and perform grooming tasks. Pt educated on energy conservation strategies, packet provided. Pt states using rollator at home and sits for rest breaks as needed. Discussed use of shower seat at home and activity pacing to conserve energy and improve activity tolerance for ADL tasks. Pt verbalized understanding. Pt presenting with impairments listed below, will follow acutely. Continue to recommend HHOT at d/c.       If plan is discharge home, recommend the following:  A little help with walking and/or transfers;A little help with bathing/dressing/bathroom;Assistance with cooking/housework;Direct supervision/assist for financial management;Direct supervision/assist for medications management;Assist for transportation;Supervision due to cognitive status   Equipment Recommendations  None recommended by OT    Recommendations for Other Services PT consult    Precautions / Restrictions Precautions Precautions: Fall Precaution Comments: 3L O2 baseline Restrictions Weight Bearing Restrictions: No       Mobility Bed Mobility               General bed mobility comments: pt declines 2/2 pain and just being OOB    Transfers                   General transfer comment: pt declines 2/2 pain and just being OOB     Balance                                           ADL either performed or assessed with clinical  judgement   ADL                                         General ADL Comments: pt declines, recently returned from bathroom and grooming tasks at sink    Extremity/Trunk Assessment Upper Extremity Assessment Upper Extremity Assessment: Overall WFL for tasks assessed   Lower Extremity Assessment Lower Extremity Assessment: Defer to PT evaluation        Vision   Vision Assessment?: No apparent visual deficits   Perception Perception Perception: Not tested   Praxis Praxis Praxis: Not tested    Cognition Arousal: Alert Behavior During Therapy: [facility] for tasks assessed/performed Overall Cognitive Status: Within Functional Limits for tasks assessed                                          Exercises Exercises: Other exercises Other Exercises Other Exercises: review of energy conservation strategies packet    Shoulder Instructions       General Comments VSS on 3L O2    Pertinent Vitals/ Pain       Pain Assessment Pain Assessment: Faces Pain Score: 6  Faces Pain Scale: Hurts even more Pain Location: bottom/generalized Pain Descriptors /  Indicators: Sore Pain Intervention(s): Limited activity within patient's tolerance, Monitored during session, Repositioned  Home Living                                          Prior Functioning/Environment              Frequency  Min 1X/week        Progress Toward Goals  OT Goals(current goals can now be found in the care plan section)  Progress towards OT goals: Progressing toward goals  Acute Rehab OT Goals Patient Stated Goal: to go home OT Goal Formulation: With patient Time For Goal Achievement: 01/20/23 Potential to Achieve Goals: Good ADL Goals Pt Will Perform Upper Body Dressing: Independently Pt Will Perform Lower Body Dressing: Independently Pt Will Transfer to Toilet: Independently;ambulating;regular height toilet Pt Will Perform Tub/Shower Transfer:  Tub transfer;Shower transfer;Independently;ambulating;shower seat Additional ADL Goal #1: pt will verbalize x3 energy conservation strategies in prep for ADLs  Plan      Co-evaluation                 AM-PAC OT "6 Clicks" Daily Activity     Outcome Measure   Help from another person eating meals?: None Help from another person taking care of personal grooming?: A Little Help from another person toileting, which includes using toliet, bedpan, or urinal?: A Little Help from another person bathing (including washing, rinsing, drying)?: A Little Help from another person to put on and taking off regular upper body clothing?: A Little Help from another person to put on and taking off regular lower body clothing?: A Little 6 Click Score: 19    End of Session Equipment Utilized During Treatment: Oxygen (3L)  OT Visit Diagnosis: Unsteadiness on feet (R26.81);Other abnormalities of gait and mobility (R26.89);Muscle weakness (generalized) (M62.81)   Activity Tolerance Patient limited by fatigue;Patient limited by pain   Patient Left in bed;with call bell/phone within reach   Nurse Communication Mobility status        Time: 1610-9604 OT Time Calculation (min): 21 min  Charges: OT General Charges $OT Visit: 1 Visit OT Treatments $Therapeutic Activity: 8-22 mins  Jodi Fila, OTD, OTR/L SecureChat Preferred Acute Rehab [phone]   Jodi Bell 01/07/2023, 9:03 AM

## 2023-01-07 NOTE — Progress Notes (Addendum)
Pt receives out-pt HD at [facility] in [facility] on MWF. Will assist as needed.   Olivia Canter Renal Navigator [phone]  Addendum at 8:53 am: Contacted by attending regarding plans to d/c pt today. Contacted [facility] and spoke to [place], Charity fundraiser. Clinic advised pt should d/c today and resume on Friday. Will fax d/c summary to clinic for continuation of care once available.   Addendum at 2:54 pm: D/C summary and today's renal note faxed to clinic for continuation of care.

## 2023-01-07 NOTE — Progress Notes (Signed)
DISCHARGE NOTE HOME Jodi Bell to be discharged Home per MD order. Discussed prescriptions and follow up appointments with the patient. Prescriptions given to patient; medication list explained in detail. Patient verbalized understanding.  Skin clean, dry and intact without evidence of skin break down, no evidence of skin tears noted. IV catheter discontinued intact. Site without signs and symptoms of complications. Dressing and pressure applied. Pt denies pain at the site currently. No complaints noted.  Patient free of lines, drains, and wounds.   An After Visit Summary (AVS) was printed and given to the patient. Patient escorted via wheelchair, and discharged home via private auto.  Margarita Grizzle, RN

## 2023-01-07 NOTE — Progress Notes (Signed)
Physical Therapy Treatment Patient Details Name: Jodi Bell MRN: [id number] DOB: 1960/02/24 Today's Date: 01/07/2023   History of Present Illness 62 y.o. female presents to [facility] hospital on 01/02/2023 with vomiting and hypotension from dialysis. Pt admitted with concerns for GIB. PMH includes: DM II, CAD s/p CABG in 2022, anxiety, arthritis, COPD, ESRD on HD, GERD, gout, obesity, PAD, and HTN.    PT Comments  Pt tolerating ambulation well with VSS on 3 L O2 and steady balance.  She report dtr on her way and she is discharging today (orders were entered during therapy session) , wanting to return to bed to await daughter.  Will cont POC while hospitalized.    If plan is discharge home, recommend the following: A little help with bathing/dressing/bathroom;Assistance with cooking/housework;Assist for transportation;Help with stairs or ramp for entrance   Can travel by private vehicle        Equipment Recommendations  None recommended by PT    Recommendations for Other Services       Precautions / Restrictions Precautions Precautions: Fall Precaution Comments: 3L O2 baseline     Mobility  Bed Mobility Overal bed mobility: Modified Independent             General bed mobility comments: sitting EOB    Transfers Overall transfer level: Needs assistance Equipment used: None Transfers: Sit to/from Stand Sit to Stand: Supervision                Ambulation/Gait Ambulation/Gait assistance: Supervision Gait Distance (Feet): 80 Feet Assistive device: Rolling walker (2 wheels) Gait Pattern/deviations: Step-through pattern Gait velocity: reduced     General Gait Details: steady wtih RW   Stairs             Wheelchair Mobility     Tilt Bed    Modified Rankin (Stroke Patients Only)       Balance Overall balance assessment: Needs assistance Sitting-balance support: No upper extremity supported, Feet supported Sitting balance-Leahy Scale: Good      Standing balance support: Bilateral upper extremity supported, No upper extremity supported Standing balance-Leahy Scale: Fair Standing balance comment: standing without AD; RW to ambulate                            Cognition Arousal: Alert Behavior During Therapy: WFL for tasks assessed/performed Overall Cognitive Status: Within Functional Limits for tasks assessed                                          Exercises      General Comments General comments (skin integrity, edema, etc.): On 3 L O2 VSS      Pertinent Vitals/Pain Pain Assessment Pain Assessment: No/denies pain    Home Living                          Prior Function            PT Goals (current goals can now be found in the care plan section) Progress towards PT goals: Progressing toward goals    Frequency    Min 1X/week      PT Plan      Co-evaluation              AM-PAC PT "6 Clicks" Mobility   Outcome Measure    Help needed  moving from lying on your back to sitting on the side of a flat bed without using bedrails?: None Help needed moving to and from a bed to a chair (including a wheelchair)?: A Little Help needed standing up from a chair using your arms (e.g., wheelchair or bedside chair)?: A Little Help needed to walk in hospital room?: A Little Help needed climbing 3-5 steps with a railing? : A Little 6 Click Score: 16    End of Session Equipment Utilized During Treatment: Oxygen Activity Tolerance: Patient tolerated treatment well Patient left: in bed;with call bell/phone within reach Nurse Communication: Mobility status PT Visit Diagnosis: Other abnormalities of gait and mobility (R26.89);Muscle weakness (generalized) (M62.81)     Time: 1610-9604 PT Time Calculation (min) (ACUTE ONLY): 10 min  Charges:    $Gait Training: 8-22 mins PT General Charges $$ ACUTE PT VISIT: 1 Visit                     Anise Salvo, PT Acute Rehab  Services  Rehab [phone]    Rayetta Humphrey 01/07/2023, 2:00 PM

## 2023-01-07 NOTE — TOC Transition Note (Signed)
Transition of Care [facility]) - CM/SW Discharge Note   Patient Details  Name: Jodi Bell MRN: [id number] Date of Birth: 03-15-1960  Transition of Care [facility] CM/SW Contact:  Tom-Johnson, Hershal Coria, RN Phone Number: 01/07/2023, 2:51 PM   Clinical Narrative:     Patient is scheduled for discharge today.  Readmission Risk Assessment done. Home health info, hospital f/u and discharge instructions on AVS. Daughter, Bishop Limbo to transport at discharge.  No further TOC needs noted.           Final next level of care: Home w Home Health Services Barriers to Discharge: Barriers Resolved   Patient Goals and CMS Choice CMS Medicare.gov Compare Post Acute Care list provided to:: Patient Choice offered to / list presented to : Patient  Discharge Placement                  Patient to be transferred to facility by: Daughter Name of family member notified: Adina    Discharge Plan and Services Additional resources added to the After Visit Summary for                  DME Arranged: N/A DME Agency: NA       HH Arranged: PT, OT, Nurse's Aide, RN, Disease Management HH Agency: [facility] Date [facility] Agency Contacted: 01/06/23 Time HH Agency Contacted: [phone] Representative spoke with at [facility] Agency: Tresa Endo  Social Determinants of Health (SDOH) Interventions SDOH Screenings   Food Insecurity: No Food Insecurity (01/02/2023)  Housing: Patient Declined (01/02/2023)  Transportation Needs: No Transportation Needs (01/02/2023)  Utilities: Not At Risk (01/02/2023)  Financial Resource Strain: Low Risk  (06/12/2022)   Received from [facility]  Recent Concern: Financial Resource Strain - Medium Risk (04/29/2022)   Received from [facility]  Physical Activity: Inactive (09/15/2018)  Social Connections: Unknown (06/23/2021)   Received from [facility], [facility]  Stress: Stress Concern Present (07/02/2022)   Received from [facility], [facility]   Tobacco Use: Medium Risk (01/01/2023)   Received from [facility]     Readmission Risk Interventions    01/05/2023    5:05 PM 07/23/2021    3:40 PM 06/28/2021    5:10 PM  Readmission Risk Prevention Plan  Transportation Screening Complete Complete Complete  Medication Review (RN Care Manager) Referral to Pharmacy Complete Complete  PCP or Specialist appointment within 3-5 days of discharge Complete Complete   HRI or Home Care Consult Complete Complete Complete  SW Recovery Care/Counseling Consult Complete Complete   Palliative Care Screening Not Applicable Not Applicable Not Applicable  Skilled Nursing Facility Not Applicable Not Applicable Not Applicable

## 2023-01-07 NOTE — Progress Notes (Signed)
[facility] Progress Note   Subjective: Seen in room. Plan for discharge today. Next HD 01/09/2023   Objective Vitals:   01/06/23 2054 01/07/23 0438 01/07/23 0738 01/07/23 0812  BP: (!) 165/63 (!) 170/56 (!) 168/63   Pulse: 61 61 60   Resp:      Temp: 98.9 F (37.2 C) 98.6 F (37 C) 98.5 F (36.9 C)   TempSrc: Oral Oral Oral   SpO2: 100% 100% 100% 100%  Weight:      Height:       Physical Exam General: Pleasant older female in NAD Heart: S1,S2 RRR 2/6 SM Lungs: CTAB A/P Abdomen: NABS NT Extremities: No LE edema Dialysis Access: L AVF +T/B   Additional Objective Labs: Basic Metabolic Panel: Recent Labs  Lab 01/03/23 0745 01/04/23 0702 01/05/23 0405 01/06/23 0435 01/07/23 0425  NA 133*   < > 132* 132* 133*  K 3.1*   < > 4.3 4.4 4.3  CL 96*   < > 98 97* 97*  CO2 25   < > 30 27 30   GLUCOSE 61*   < > 112* 120* 92  BUN 22   < > 15 21 11   CREATININE 4.55*   < > 4.27* 5.40* 3.82*  CALCIUM 8.3*   < > 9.2 9.3 9.5  PHOS 2.4*  --  4.0 4.5  --    < > = values in this interval not displayed.   Liver Function Tests: Recent Labs  Lab 01/02/23 1811 01/05/23 0405 01/06/23 0435  AST 22  --   --   ALT 11  --   --   ALKPHOS 48  --   --   BILITOT 1.2*  --   --   PROT 5.0*  --   --   ALBUMIN 2.4*  2.4* 2.7* 2.8*   No results for input(s): "LIPASE", "AMYLASE" in the last 168 hours. CBC: Recent Labs  Lab 01/03/23 0745 01/04/23 0702 01/05/23 0405 01/06/23 0435 01/07/23 0425  WBC 5.0 5.5 4.0 4.2 4.9  NEUTROABS 3.0  --   --   --   --   HGB 8.2* 7.6* 7.0* 7.1* 8.4*  HCT 27.2* 25.8* 24.6* 24.9* 28.7*  MCV 107.1* 107.5* 108.8* 108.7* 106.3*  PLT 100* 107* 91* 101* 90*   Blood Culture    Component Value Date/Time   SDES BLOOD RIGHT FOREARM 01/03/2023 1009   SPECREQUEST  01/03/2023 1009    BOTTLES DRAWN AEROBIC AND ANAEROBIC Blood Culture adequate volume   CULT  01/03/2023 1009    NO GROWTH 4 DAYS Performed at [facility], [address], [place], [place] [address]    REPTSTATUS PENDING 01/03/2023 1009    Cardiac Enzymes: No results for input(s): "CKTOTAL", "CKMB", "CKMBINDEX", "TROPONINI" in the last 168 hours. CBG: Recent Labs  Lab 01/02/23 1410 01/03/23 0132  GLUCAP 103* 89   Iron Studies: No results for input(s): "IRON", "TIBC", "TRANSFERRIN", "FERRITIN" in the last 72 hours. @lablastinr3 @ Studies/Results: No results found. Medications:   sodium chloride   Intravenous Once   arformoterol  15 mcg Nebulization BID   budesonide (PULMICORT) nebulizer solution  0.25 mg Nebulization BID   busPIRone  5 mg Oral TID   carvedilol  25 mg Oral BID WC   cloNIDine  0.3 mg Transdermal Q Sun   conjugated estrogens  1 Applicatorful Vaginal Daily   irbesartan  300 mg Oral Daily   isosorbide mononitrate  60 mg Oral Daily   lidocaine  1 patch  Transdermal Q24H   pantoprazole (PROTONIX) IV  40 mg Intravenous Q12H   predniSONE  5 mg Oral Q breakfast   revefenacin  175 mcg Nebulization Daily     OP HD: [facility] MWF [address]  from 12/25/22 (needs updating) --> 3.5h  58.5kg  400/600  2K/3Ca bath  Hep none  L AVF      Assessment/Plan:   # Acute on chronic hypoxic/hypercapnic respiratory failure - with pulmonary edema on x-ray. On BiPAP initially. Had HD Friday night w/ 1.5 L off then was on 2-3 L Diagonal. Had HD today (Sunday) w/ 1.6 L off . On 2 L . No vol excess on exam.    # ESRD: MWF HD w/ [place] [facility]. Has missed dialysis. Has had HD x 2 her as above, including today (Sunday HD for holiday schedule). Next HD 01/09/2023    # Hypertension/volume: She is supposed to be on multiple antihypertensives.  BP's were soft but now are normal. BP meds have been resumed. Still above OP EDW. UF as tolerated to optimize volume.    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 HGB 8.4 01/07/2023 after 1 unit PRBCs 01/06/2023   # CKD-metabolic Bone Disease:PO4 at goal. Corrected calcium high. No VDRA in orders. Change to 2.0 Ca  bath.   Lundyn Coste H. Emani Morad NP-C 01/07/2023, 10:12 AM  [employer] [phone]

## 2023-01-07 NOTE — Plan of Care (Signed)
  Problem: Education: Goal: Knowledge of General Education information will improve Description: Including pain rating scale, medication(s)/side effects and non-pharmacologic comfort measures Outcome: Progressing   Problem: Activity: Goal: Risk for activity intolerance will decrease Outcome: Progressing   Problem: Elimination: Goal: Will not experience complications related to bowel motility Outcome: Progressing

## 2023-01-07 NOTE — Discharge Summary (Signed)
Name: Jodi Bell MRN: [id number] DOB: [birth year] 62 y.o. PCP: Malka So., MD  Date of Admission: 01/02/2023  2:08 PM Date of Discharge: 01/07/23 Attending Physician: Dr. Mayford Knife  Discharge Diagnosis: Principal Problem:   GI bleed Active Problems:   Essential hypertension   COPD (chronic obstructive pulmonary disease) (HCC)   Acute anemia   Chronic heart failure (HCC)   Demand ischemia (HCC)   Thrombocytopenia (HCC)   Respiratory failure with hypoxia and hypercapnia (HCC)   Biventricular congestive heart failure (HCC)   Myocardial injury   Uremia   S/P TAVR (transcatheter aortic valve replacement)   Coronary artery disease involving coronary bypass graft of native heart without angina pectoris   ESRD on dialysis (HCC)   Acute diastolic (congestive) heart failure (HCC)   Right ventricular failure (HCC)    Discharge Medications: Allergies as of 01/07/2023       Reactions   3-methyl-2-benzothiazolinone Hydrazone Other (See Comments)   Muscle weakness muscle cramping in legs Other reaction(s): Myalgias (Muscle Pain)   Banana Anaphylaxis, Swelling, Other (See Comments)   TONGUE AND MOUTH SWELLING   Black Walnut Flavor Anaphylaxis, Itching   Walnuts   Hazelnut (filbert) Anaphylaxis   Hazelnuts   Leflunomide And Related Other (See Comments)   Severe Headache   Lisinopril Swelling   Angioedema  Swelling of the face   No [employer] Food Allergies Anaphylaxis   Spicy Mustard 4.7.2021 Pt reports that she eats Regular Yellow Mustard Swelling and itching of tongue   Other Anaphylaxis, Swelling, Other (See Comments)   Cosentyx= angioedema Mouth itches and tongue swelling Hazel nuts and pecans also Walnuts Renal failure Serotonin syndrome  Tremors Muscle weakness muscle cramping in legs Other reaction(s): Myalgias (Muscle Pain)   Pecan Nut (diagnostic) Anaphylaxis, Itching   Pecans   Trazodone And Nefazodone Other (See Comments)   Serotonin Syndrome   Tremors   Adalimumab Other (See Comments)   Blisters on abdomen =humira   Escitalopram Oxalate Other (See Comments)   Hand problems - Serotonin Syndrome    Ezetimibe Other (See Comments)   myalgias cramps   Secukinumab Swelling   Cosentyx = angiodema   Statins Other (See Comments)   Leg pains and weakness   Ferrlecit [na Ferric Gluc Cplx In Sucrose]    Not reaction given from HD   Gabapentin Other (See Comments)   tremors   Nsaids    Avoid due to renal problems         Medication List     STOP taking these medications    hydrALAZINE 100 MG tablet Commonly known as: APRESOLINE   Proctozone-HC 2.5 % rectal cream Generic drug: hydrocortisone   Soothe Nighttime Oint       TAKE these medications    acetaminophen 500 MG tablet Commonly known as: TYLENOL Take 1,000 mg by mouth daily as needed (pain).   albuterol (2.5 MG/3ML) 0.083% nebulizer solution Commonly known as: PROVENTIL Take 3 mLs (2.5 mg total) by nebulization every 6 (six) hours as needed for wheezing or shortness of breath.   Ventolin HFA 108 (90 Base) MCG/ACT inhaler Generic drug: albuterol Inhale 2 puffs into the lungs See admin instructions. Take 2 puffs every 4-6 hours as needed for wheezing and shortness of breath   allopurinol 100 MG tablet Commonly known as: ZYLOPRIM Take 100 mg by mouth every evening.   aspirin 81 MG chewable tablet Chew 1 tablet (81 mg total) by mouth every other day. What changed: when to take this  busPIRone 5 MG tablet Commonly known as: BUSPAR Take 5 mg by mouth 3 (three) times daily.   carvedilol 25 MG tablet Commonly known as: COREG Take 1 tablet (25 mg total) by mouth 2 (two) times daily with a meal.   cetirizine 10 MG tablet Commonly known as: ZYRTEC Take 10 mg by mouth daily as needed for allergies.   cinacalcet 30 MG tablet Commonly known as: SENSIPAR Take 60 mg by mouth every Monday, Wednesday, and Friday with hemodialysis. Pt states she takes 2 tab  on Mon, 3 tab on Wed, and 2 tab on Fri.   cloNIDine 0.3 mg/24hr patch Commonly known as: CATAPRES - Dosed in mg/24 hr Place 0.3 mg onto the skin every Sunday.   conjugated estrogens vaginal cream Commonly known as: PREMARIN Place vaginally daily as needed.   diclofenac Sodium 1 % Gel Commonly known as: VOLTAREN Apply 2 g topically daily as needed (pain).   docusate sodium 100 MG capsule Commonly known as: COLACE Take 100 mg by mouth daily.   EPINEPHrine 0.3 mg/0.3 mL Soaj injection Commonly known as: EPI-PEN Inject into the muscle as needed for anaphylaxis.   fluticasone 50 MCG/ACT nasal spray Commonly known as: FLONASE Place 1 spray into both nostrils daily as needed for allergies or rhinitis.   isosorbide mononitrate 60 MG 24 hr tablet Commonly known as: IMDUR Take 1 tablet (60 mg total) by mouth daily.   LORazepam 0.5 MG tablet Commonly known as: ATIVAN Take 0.5 mg by mouth every 8 (eight) hours as needed for anxiety.   montelukast 10 MG tablet Commonly known as: SINGULAIR Take 10 mg by mouth daily with supper.   multivitamin Tabs tablet Take 1 tablet by mouth every Monday, Wednesday, and Friday.   neomycin-polymyxin b-dexamethasone 3.5-10000-0.1 Susp Commonly known as: MAXITROL Place 1 drop into both eyes in the morning, at noon, in the evening, and at bedtime.   NIFEdipine 60 MG 24 hr tablet Commonly known as: PROCARDIA XL/NIFEDICAL XL Take 60 mg by mouth 2 (two) times daily.   olmesartan 40 MG tablet Commonly known as: BENICAR Take 40 mg by mouth every evening.   ondansetron 4 MG tablet Commonly known as: ZOFRAN Take 4 mg by mouth 2 (two) times daily as needed for nausea or vomiting.   OXYGEN Inhale 2-3 L into the lungs continuous.   pantoprazole 40 MG tablet Commonly known as: PROTONIX Take 40 mg by mouth 2 (two) times daily.   prednisoLONE acetate 1 % ophthalmic suspension Commonly known as: PRED FORTE Place 1 drop into both eyes every 4  (four) hours.   predniSONE 5 MG tablet Commonly known as: DELTASONE Take 1 tablet (5 mg total) by mouth daily with breakfast. What changed: when to take this   PROBIOTIC PO Take 1 capsule by mouth daily.   silver sulfADIAZINE 1 % cream Commonly known as: SILVADENE Apply 1 Application topically daily as needed (wound care).   Stiolto Respimat 2.5-2.5 MCG/ACT Aers Generic drug: Tiotropium Bromide-Olodaterol Inhale 2 puffs once daily into the lungs.   TEARS NATURALE OP Place 1 drop into both eyes as needed (dry eye).   Vitamin D 50 MCG (2000 UT) tablet Take 2,000 Units by mouth daily.   zolpidem 5 MG tablet Commonly known as: AMBIEN Take 5 mg by mouth at bedtime as needed for sleep.        Disposition and follow-up:   Ms.Kailen T Leiker was discharged from [facility] in Stable condition.  At the hospital follow  up visit please address:  1.  Follow-up:  a. GI bleeding: check for evidence of GI bleeding such as history of dark stools. Patient has GI follow up on Dec 19 with [facility]. Aspirin changed to every other day in setting of GI bleed    b. ESRD: ensure patient has been attending outpatient dialysis and is euvolemic   2.  Labs / imaging needed at time of follow-up: None  3.  Pending labs/ test needing follow-up: None  4.  Medication Changes  Started: Estrogen cream  Stopped: Hydralazine  Changed: Aspirin 81mg , changed to every other day  Follow-up Appointments:  Follow-up Information     Janetta Hora, PA-C Follow up.   Specialties: Cardiology, Radiology Why: 02/02/2023 at 0930 Contact information: [address] [place] [address] [phone]                 Hospital Course by problem list:  Principal Problem:   GI bleed Active Problems:   Essential hypertension   Acute hypercapnic respiratory failure (HCC)   GERD (gastroesophageal reflux disease)   COPD (chronic obstructive pulmonary disease)  (HCC)   Acute anemia   ESRD (end stage renal disease) (HCC)   OSA (obstructive sleep apnea)   Chronic heart failure (HCC)   S/P TAVR (transcatheter aortic valve replacement)   Demand ischemia (HCC)   Thrombocytopenia (HCC)   GI bleed Acute on chronic anemia Patient presented with reports of at least one episode of dark stools. She has a history of GI bleeding due to peptic ulcer disease and Dieulafoy lesion. She was started on daily ASA after recent TAVR on 12/23/22. She was admitted with a mild drop in hemoglobin from 8.2-7.6 without signs of ongoing bleeding and further workup with iron panel and blood smear were consistent with anemia of chronic disease. She received two units of pRBCs during dialysis sessions. GI evaluated the patient and recommended PPI twice daily but no endoscopic workup at this time. She is scheduled for outpatient GI follow up. Hemoglobin was stable at time of discharge.   Recurrent left-sided chest pain Demand ischemia CAD Patient has a history of CAD s/p CABG x2 with LIMA to LAD and SVG to Diag in 2022.  Initial troponin 252 -> 230 -> 212 -> 196. During admission she did complain of some chest pain that was similar to her pain she had prior to her CABG.  Workup with EKG on admission and at the time of recurrent chest pain included stable EKGs, downtrending troponins, and resolution of her pain with a lidocaine patch.  COPD Patient's LAMA, LABA, and ICS were continued during admission. She was maintained on her home oxygen requirement of 2-3L by   ESRD Hyperkalemia Patient missed two scheduled sessions of outpatient dialysis due to nausea, vomiting, and malaise. Initial potassium was high, and she had some signs of volume overload such as lung crackles on auscultation. She received HD x 3, which normalized her volume status and electrolyte abnormalities. She was continued on her outpatient dialysis schedule at time of discharge.   Chronic HFpEF Aortic stenosis  s/p TAVR Patient presented volume overloaded after missing two dialysis sessions due to nausea/vomiting and malaise. She received HD x2 for volume management.  No schistocytes on blood smear.  Echo after the TAVR showed underexpanded valve due to severe calcification.  Repeat echocardiogram showed improvement in valve function and no reason to consult structural heart during this admission.  Thrombocytopenia Chronic issue for this patient on  and off since 2015. Platelet count between 90-110 throughout admission without overt signs of mucosal bleeding. Low suspicion for DIC or TTP. Likely related to ESRD.   Hypertension Home Coreg, Imdur, Clonidine were used to manage blood pressure during admission. Olmesartan was added back as patient could tolerate per nephrology. Hydralazine was never added on, and was discontinued at discharge.   Vaginal itching Patient reported vaginal itching and minor discharge. Wet prep was performed x2, both of which were negative. Vaginal exam showed some vaginal atrophy. Estrogen cream was prescribed, which patient reported helped with itching.    Discharge Subjective: Patient is feeling well today. She ate her breakfast without nausea or vomiting. She is breathing easily on 2L [place]. She feels ready to go home, and is excited to be with family for thanksgiving.   Discharge Exam:   BP (!) 168/63 (BP Location: Right Arm)   Pulse 60   Temp 98.5 F (36.9 C) (Oral)   Resp 20   Ht 5\' 1"  (1.549 m)   Wt 68.1 kg   SpO2 100%   BMI 28.37 kg/m  Constitutional: well-appearing, in no acute distress HENT: normocephalic atraumatic, mucous membranes moist Eyes: conjunctiva non-erythematous Neck: supple Cardiovascular: regular rate and rhythm, no m/r/g Pulmonary/Chest: normal work of breathing on room air, lungs clear to auscultation bilaterally Abdominal: soft, non-tender, non-distended MSK: normal bulk and tone Neurological: alert & oriented x 3, 5/5 strength in bilateral  upper and lower extremities, normal gait Skin: warm and dry  Pertinent Labs, Studies, and Procedures:     Latest Ref Rng & Units 01/07/2023    4:25 AM 01/06/2023    4:35 AM 01/05/2023    4:05 AM  CBC  WBC 4.0 - 10.5 K/uL 4.9  4.2  4.0   Hemoglobin 12.0 - 15.0 g/dL 8.4  7.1  7.0   Hematocrit 36.0 - 46.0 % 28.7  24.9  24.6   Platelets 150 - 400 K/uL 90  101  91        Latest Ref Rng & Units 01/07/2023    4:25 AM 01/06/2023    4:35 AM 01/05/2023    4:05 AM  CMP  Glucose 70 - 99 mg/dL 92  409  811   BUN 8 - 23 mg/dL 11  21  15    Creatinine 0.44 - 1.00 mg/dL 9.14  7.82  9.56   Sodium 135 - 145 mmol/L 133  132  132   Potassium 3.5 - 5.1 mmol/L 4.3  4.4  4.3   Chloride 98 - 111 mmol/L 97  97  98   CO2 22 - 32 mmol/L 30  27  30    Calcium 8.9 - 10.3 mg/dL 9.5  9.3  9.2     CT ABDOMEN PELVIS WO CONTRAST  Result Date: 01/02/2023 CLINICAL DATA:  Abdomen pain nausea vomiting hypotension dialysis patient EXAM: CT ABDOMEN AND PELVIS WITHOUT CONTRAST TECHNIQUE: Multidetector CT imaging of the abdomen and pelvis was performed following the standard protocol without IV contrast. RADIATION DOSE REDUCTION: This exam was performed according to the departmental dose-optimization program which includes automated exposure control, adjustment of the mA and/or kV according to patient size and/or use of iterative reconstruction technique. COMPARISON:  CT 09/19/2022 FINDINGS: Lower chest: Lung bases demonstrate small pleural effusions and cardiomegaly. No acute airspace disease. Coronary vascular calcifications. Aortic valve prosthesis. Hepatobiliary: No calcified gallstone or biliary dilatation. No focal hepatic abnormality. Pancreas: Unremarkable. No pancreatic ductal dilatation or surrounding inflammatory changes. Spleen: Normal in size without focal abnormality.  Adrenals/Urinary Tract: Adrenal glands are normal. Kidneys show no hydronephrosis. Extensive renal vascular calcifications. Probable renal cysts  for which no imaging follow-up is recommended. Decompressed urinary bladder Stomach/Bowel: Stomach nonenlarged. There is no dilated small bowel. Diffuse diverticular disease of the colon. Under distended colon versus mild colitis type changes. No pericolonic inflammation. Vascular/Lymphatic: Advanced aortic atherosclerosis. No aneurysm. Mildly enlarged retroperitoneal lymph nodes without significant change. Reproductive: Uterus and bilateral adnexa are unremarkable. Other: Negative for free air. Small volume free fluid within the abdomen and pelvis. Generalized subcutaneous edema consistent with anasarca Musculoskeletal: Posterior lumbar fusion hardware at L3-L4. Chronic deformity at L2. Chronic endplate irregularity at L2-L3. Diffuse skeletal sclerosis likely corresponding to chronic kidney disease history. IMPRESSION: 1. Negative for bowel obstruction or free air. Under distended colon versus mild colitis type changes. Diffuse diverticular disease. 2. Small volume free fluid within the abdomen and pelvis. Generalized subcutaneous edema consistent with anasarca. Small pleural effusions. Cardiomegaly. 3. Aortic atherosclerosis. Aortic Atherosclerosis (ICD10-I70.0). Electronically Signed   By: Jasmine Pang M.D.   On: 01/02/2023 21:46   CT Head Wo Contrast  Result Date: 01/02/2023 CLINICAL DATA:  Mental status change EXAM: CT HEAD WITHOUT CONTRAST TECHNIQUE: Contiguous axial images were obtained from the base of the skull through the vertex without intravenous contrast. RADIATION DOSE REDUCTION: This exam was performed according to the departmental dose-optimization program which includes automated exposure control, adjustment of the mA and/or kV according to patient size and/or use of iterative reconstruction technique. COMPARISON:  MRI 09/20/2022, CT brain 05/02/2021 FINDINGS: Brain: No acute territorial infarction, hemorrhage or intracranial mass. Mild to moderate white matter hypodensity consistent with  chronic small vessel ischemic change. Stable ventricular size Vascular: No hyperdense vessels.  Carotid vascular calcification. Skull: No fracture. Diffuse sclerosis probably due to history of chronic kidney disease through Sinuses/Orbits: No acute finding. Other: None IMPRESSION: 1. No CT evidence for acute intracranial abnormality. 2. Mild to moderate chronic small vessel ischemic changes of the white matter. Electronically Signed   By: Jasmine Pang M.D.   On: 01/02/2023 21:33   DG Chest 2 View  Result Date: 01/02/2023 CLINICAL DATA:  Chest pain and weakness. EXAM: CHEST - 2 VIEW COMPARISON:  12/19/2022. FINDINGS: There is moderate diffuse pulmonary vascular congestion/edema. No dense consolidation or lung collapse. Bilateral costophrenic angles are clear. No pneumothorax. Stable mildly enlarged cardio-mediastinal silhouette, which is accentuated by low lung volume. There are surgical staples along the heart border and sternotomy wires, status post CABG (coronary artery bypass graft). Prosthetic aortic valve and presumed cardiac monitor noted overlying the left anterior chest wall. No acute osseous abnormalities. Lower cervical spinal fixation hardware seen. The soft tissues are within normal limits. IMPRESSION: *Findings favor congestive heart failure/pulmonary edema. Electronically Signed   By: Jules Schick M.D.   On: 01/02/2023 15:40    Signed: Monna Fam, MD PGY-1 01/07/2023, 1:47 PM   Pager: [phone]

## 2023-01-08 LAB — BPAM RBC
Blood Product Expiration Date: 202412142359
ISSUE DATE / TIME: 202411260919
Unit Type and Rh: 6200

## 2023-01-08 LAB — CULTURE, BLOOD (ROUTINE X 2)
Culture: NO GROWTH
Culture: NO GROWTH
Special Requests: ADEQUATE
Special Requests: ADEQUATE

## 2023-01-08 LAB — TYPE AND SCREEN
ABO/RH(D): AB POS
Antibody Screen: NEGATIVE
Unit division: 0

## 2023-01-27 ENCOUNTER — Encounter (HOSPITAL_BASED_OUTPATIENT_CLINIC_OR_DEPARTMENT_OTHER): Payer: Self-pay | Admitting: Emergency Medicine

## 2023-01-27 ENCOUNTER — Inpatient Hospital Stay (HOSPITAL_BASED_OUTPATIENT_CLINIC_OR_DEPARTMENT_OTHER)
Admission: EM | Admit: 2023-01-27 | Discharge: 2023-01-29 | DRG: 377 | Disposition: A | Discharge status: Home / Self Care | Payer: Medicare PPO | Attending: Internal Medicine | Admitting: Internal Medicine

## 2023-01-27 ENCOUNTER — Other Ambulatory Visit: Payer: Self-pay

## 2023-01-27 DIAGNOSIS — Z9981 Dependence on supplemental oxygen: Secondary | ICD-10-CM | POA: Diagnosis not present

## 2023-01-27 DIAGNOSIS — Z951 Presence of aortocoronary bypass graft: Secondary | ICD-10-CM

## 2023-01-27 DIAGNOSIS — E1122 Type 2 diabetes mellitus with diabetic chronic kidney disease: Secondary | ICD-10-CM | POA: Diagnosis not present

## 2023-01-27 DIAGNOSIS — K31811 Angiodysplasia of stomach and duodenum with bleeding: Secondary | ICD-10-CM | POA: Diagnosis not present

## 2023-01-27 DIAGNOSIS — Z885 Allergy status to narcotic agent status: Secondary | ICD-10-CM

## 2023-01-27 DIAGNOSIS — Z833 Family history of diabetes mellitus: Secondary | ICD-10-CM | POA: Diagnosis not present

## 2023-01-27 DIAGNOSIS — Z952 Presence of prosthetic heart valve: Secondary | ICD-10-CM

## 2023-01-27 DIAGNOSIS — L4050 Arthropathic psoriasis, unspecified: Secondary | ICD-10-CM | POA: Diagnosis not present

## 2023-01-27 DIAGNOSIS — Z5986 Financial insecurity: Secondary | ICD-10-CM

## 2023-01-27 DIAGNOSIS — J449 Chronic obstructive pulmonary disease, unspecified: Secondary | ICD-10-CM | POA: Diagnosis present

## 2023-01-27 DIAGNOSIS — D696 Thrombocytopenia, unspecified: Secondary | ICD-10-CM | POA: Diagnosis present

## 2023-01-27 DIAGNOSIS — I132 Hypertensive heart and chronic kidney disease with heart failure and with stage 5 chronic kidney disease, or end stage renal disease: Secondary | ICD-10-CM | POA: Diagnosis not present

## 2023-01-27 DIAGNOSIS — I25709 Atherosclerosis of coronary artery bypass graft(s), unspecified, with unspecified angina pectoris: Secondary | ICD-10-CM | POA: Diagnosis present

## 2023-01-27 DIAGNOSIS — J9611 Chronic respiratory failure with hypoxia: Secondary | ICD-10-CM | POA: Diagnosis present

## 2023-01-27 DIAGNOSIS — Z79818 Long term (current) use of other agents affecting estrogen receptors and estrogen levels: Secondary | ICD-10-CM

## 2023-01-27 DIAGNOSIS — J4489 Other specified chronic obstructive pulmonary disease: Secondary | ICD-10-CM | POA: Diagnosis not present

## 2023-01-27 DIAGNOSIS — D649 Anemia, unspecified: Secondary | ICD-10-CM | POA: Diagnosis present

## 2023-01-27 DIAGNOSIS — M898X9 Other specified disorders of bone, unspecified site: Secondary | ICD-10-CM | POA: Diagnosis not present

## 2023-01-27 DIAGNOSIS — M109 Gout, unspecified: Secondary | ICD-10-CM | POA: Diagnosis present

## 2023-01-27 DIAGNOSIS — K219 Gastro-esophageal reflux disease without esophagitis: Secondary | ICD-10-CM | POA: Diagnosis present

## 2023-01-27 DIAGNOSIS — K922 Gastrointestinal hemorrhage, unspecified: Secondary | ICD-10-CM | POA: Diagnosis present

## 2023-01-27 DIAGNOSIS — D631 Anemia in chronic kidney disease: Secondary | ICD-10-CM | POA: Diagnosis present

## 2023-01-27 DIAGNOSIS — Z992 Dependence on renal dialysis: Secondary | ICD-10-CM

## 2023-01-27 DIAGNOSIS — Z8249 Family history of ischemic heart disease and other diseases of the circulatory system: Secondary | ICD-10-CM

## 2023-01-27 DIAGNOSIS — Z7982 Long term (current) use of aspirin: Secondary | ICD-10-CM

## 2023-01-27 DIAGNOSIS — Z79899 Other long term (current) drug therapy: Secondary | ICD-10-CM | POA: Diagnosis not present

## 2023-01-27 DIAGNOSIS — Z808 Family history of malignant neoplasm of other organs or systems: Secondary | ICD-10-CM

## 2023-01-27 DIAGNOSIS — Z8711 Personal history of peptic ulcer disease: Secondary | ICD-10-CM | POA: Diagnosis not present

## 2023-01-27 DIAGNOSIS — Z7951 Long term (current) use of inhaled steroids: Secondary | ICD-10-CM

## 2023-01-27 DIAGNOSIS — K449 Diaphragmatic hernia without obstruction or gangrene: Secondary | ICD-10-CM | POA: Diagnosis not present

## 2023-01-27 DIAGNOSIS — E119 Type 2 diabetes mellitus without complications: Secondary | ICD-10-CM

## 2023-01-27 DIAGNOSIS — Z87891 Personal history of nicotine dependence: Secondary | ICD-10-CM

## 2023-01-27 DIAGNOSIS — N186 End stage renal disease: Secondary | ICD-10-CM | POA: Diagnosis present

## 2023-01-27 DIAGNOSIS — I10 Essential (primary) hypertension: Secondary | ICD-10-CM | POA: Diagnosis present

## 2023-01-27 DIAGNOSIS — D638 Anemia in other chronic diseases classified elsewhere: Secondary | ICD-10-CM | POA: Diagnosis present

## 2023-01-27 DIAGNOSIS — I2510 Atherosclerotic heart disease of native coronary artery without angina pectoris: Secondary | ICD-10-CM | POA: Diagnosis present

## 2023-01-27 DIAGNOSIS — Z888 Allergy status to other drugs, medicaments and biological substances status: Secondary | ICD-10-CM

## 2023-01-27 DIAGNOSIS — D62 Acute posthemorrhagic anemia: Secondary | ICD-10-CM | POA: Diagnosis present

## 2023-01-27 DIAGNOSIS — Z981 Arthrodesis status: Secondary | ICD-10-CM

## 2023-01-27 DIAGNOSIS — I5032 Chronic diastolic (congestive) heart failure: Secondary | ICD-10-CM | POA: Diagnosis present

## 2023-01-27 DIAGNOSIS — K625 Hemorrhage of anus and rectum: Secondary | ICD-10-CM | POA: Diagnosis present

## 2023-01-27 DIAGNOSIS — Z886 Allergy status to analgesic agent status: Secondary | ICD-10-CM

## 2023-01-27 DIAGNOSIS — Z91018 Allergy to other foods: Secondary | ICD-10-CM

## 2023-01-27 DIAGNOSIS — E1151 Type 2 diabetes mellitus with diabetic peripheral angiopathy without gangrene: Secondary | ICD-10-CM | POA: Diagnosis present

## 2023-01-27 DIAGNOSIS — Z825 Family history of asthma and other chronic lower respiratory diseases: Secondary | ICD-10-CM

## 2023-01-27 DIAGNOSIS — Z96652 Presence of left artificial knee joint: Secondary | ICD-10-CM | POA: Diagnosis present

## 2023-01-27 DIAGNOSIS — Z8261 Family history of arthritis: Secondary | ICD-10-CM

## 2023-01-27 DIAGNOSIS — Z801 Family history of malignant neoplasm of trachea, bronchus and lung: Secondary | ICD-10-CM

## 2023-01-27 LAB — CBC WITH DIFFERENTIAL/PLATELET
Abs Immature Granulocytes: 0.03 10*3/uL (ref 0.00–0.07)
Basophils Absolute: 0 10*3/uL (ref 0.0–0.1)
Basophils Relative: 0 %
Eosinophils Absolute: 0.9 10*3/uL — ABNORMAL HIGH (ref 0.0–0.5)
Eosinophils Relative: 17 %
HCT: 19.1 % — ABNORMAL LOW (ref 36.0–46.0)
Hemoglobin: 5.7 g/dL — CL (ref 12.0–15.0)
Immature Granulocytes: 1 %
Lymphocytes Relative: 10 %
Lymphs Abs: 0.6 10*3/uL — ABNORMAL LOW (ref 0.7–4.0)
MCH: 32.2 pg (ref 26.0–34.0)
MCHC: 29.8 g/dL — ABNORMAL LOW (ref 30.0–36.0)
MCV: 107.9 fL — ABNORMAL HIGH (ref 80.0–100.0)
Monocytes Absolute: 0.3 10*3/uL (ref 0.1–1.0)
Monocytes Relative: 6 %
Neutro Abs: 3.6 10*3/uL (ref 1.7–7.7)
Neutrophils Relative %: 66 %
Platelets: 80 10*3/uL — ABNORMAL LOW (ref 150–400)
RBC: 1.77 MIL/uL — ABNORMAL LOW (ref 3.87–5.11)
RDW: 19 % — ABNORMAL HIGH (ref 11.5–15.5)
WBC: 5.5 10*3/uL (ref 4.0–10.5)
nRBC: 0 % (ref 0.0–0.2)

## 2023-01-27 LAB — MRSA NEXT GEN BY PCR, NASAL: MRSA by PCR Next Gen: NOT DETECTED

## 2023-01-27 LAB — PREPARE RBC (CROSSMATCH)

## 2023-01-27 LAB — BASIC METABOLIC PANEL
Anion gap: 6 (ref 5–15)
BUN: 32 mg/dL — ABNORMAL HIGH (ref 8–23)
CO2: 25 mmol/L (ref 22–32)
Calcium: 8.4 mg/dL — ABNORMAL LOW (ref 8.9–10.3)
Chloride: 105 mmol/L (ref 98–111)
Creatinine, Ser: 4.06 mg/dL — ABNORMAL HIGH (ref 0.44–1.00)
GFR, Estimated: 12 mL/min — ABNORMAL LOW (ref >60)
Glucose, Bld: 147 mg/dL — ABNORMAL HIGH (ref 70–99)
Potassium: 4 mmol/L (ref 3.5–5.1)
Sodium: 136 mmol/L (ref 135–145)

## 2023-01-27 LAB — HEMOGLOBIN AND HEMATOCRIT, BLOOD
HCT: 24 % — ABNORMAL LOW (ref 36.0–46.0)
Hemoglobin: 7.3 g/dL — ABNORMAL LOW (ref 12.0–15.0)

## 2023-01-27 MED ORDER — SODIUM CHLORIDE 0.9% IV SOLUTION: Freq: Once | INTRAVENOUS | Status: DC

## 2023-01-27 MED ORDER — SODIUM CHLORIDE 0.9% IV SOLUTION: Freq: Once | INTRAVENOUS | Status: AC

## 2023-01-27 MED ORDER — ALBUTEROL SULFATE (2.5 MG/3ML) 0.083% IN NEBU
2.5000 mg | INHALATION_SOLUTION | RESPIRATORY_TRACT | Status: DC | PRN
  Filled 2023-01-27: qty 3

## 2023-01-27 MED ORDER — ACETAMINOPHEN 325 MG PO TABS
650.0000 mg | ORAL_TABLET | Freq: Four times a day (QID) | ORAL | Status: DC | PRN
  Administered 2023-01-29: 650 mg via ORAL
  Filled 2023-01-27: qty 2

## 2023-01-27 MED ORDER — PREDNISONE 10 MG PO TABS
5.0000 mg | ORAL_TABLET | Freq: Every day | ORAL | Status: DC
  Administered 2023-01-28 – 2023-01-29 (×2): 5 mg via ORAL
  Filled 2023-01-27 (×2): qty 1

## 2023-01-27 MED ORDER — IRBESARTAN 300 MG PO TABS
300.0000 mg | ORAL_TABLET | Freq: Every day | ORAL | Status: DC
  Administered 2023-01-27 – 2023-01-29 (×2): 300 mg via ORAL
  Filled 2023-01-27 (×3): qty 1

## 2023-01-27 MED ORDER — ALLOPURINOL 100 MG PO TABS
100.0000 mg | ORAL_TABLET | Freq: Every day | ORAL | Status: DC
  Administered 2023-01-27 – 2023-01-29 (×2): 100 mg via ORAL
  Filled 2023-01-27 (×2): qty 1

## 2023-01-27 MED ORDER — PANTOPRAZOLE SODIUM 40 MG IV SOLR
40.0000 mg | Freq: Two times a day (BID) | INTRAVENOUS | Status: DC
  Administered 2023-01-27 – 2023-01-29 (×4): 40 mg via INTRAVENOUS
  Filled 2023-01-27 (×4): qty 10

## 2023-01-27 MED ORDER — ACETAMINOPHEN 650 MG RE SUPP: 650.0000 mg | Freq: Four times a day (QID) | RECTAL | Status: DC | PRN

## 2023-01-27 MED ORDER — BUSPIRONE HCL 5 MG PO TABS
5.0000 mg | ORAL_TABLET | Freq: Three times a day (TID) | ORAL | Status: DC
  Administered 2023-01-27 – 2023-01-29 (×7): 5 mg via ORAL
  Filled 2023-01-27 (×7): qty 1

## 2023-01-27 MED ORDER — NIFEDIPINE ER OSMOTIC RELEASE 60 MG PO TB24
60.0000 mg | ORAL_TABLET | Freq: Two times a day (BID) | ORAL | Status: DC
  Administered 2023-01-27 – 2023-01-29 (×5): 60 mg via ORAL
  Filled 2023-01-27 (×6): qty 1

## 2023-01-27 MED ORDER — PANTOPRAZOLE SODIUM 40 MG IV SOLR
40.0000 mg | Freq: Once | INTRAVENOUS | Status: AC
  Administered 2023-01-27: 40 mg via INTRAVENOUS
  Filled 2023-01-27: qty 10

## 2023-01-27 MED ORDER — ISOSORBIDE MONONITRATE ER 60 MG PO TB24
60.0000 mg | ORAL_TABLET | Freq: Every day | ORAL | Status: DC
  Administered 2023-01-27 – 2023-01-29 (×3): 60 mg via ORAL
  Filled 2023-01-27 (×2): qty 1
  Filled 2023-01-27: qty 2
  Filled 2023-01-27: qty 1

## 2023-01-27 MED ORDER — CINACALCET HCL 30 MG PO TABS
60.0000 mg | ORAL_TABLET | ORAL | Status: DC
  Administered 2023-01-28: 60 mg via ORAL
  Filled 2023-01-27: qty 2

## 2023-01-27 MED ORDER — UMECLIDINIUM-VILANTEROL 62.5-25 MCG/ACT IN AEPB
1.0000 | INHALATION_SPRAY | Freq: Every day | RESPIRATORY_TRACT | Status: DC
  Filled 2023-01-27: qty 14

## 2023-01-27 MED ORDER — ZOLPIDEM TARTRATE 5 MG PO TABS: 5.0000 mg | ORAL_TABLET | Freq: Every evening | ORAL | Status: DC | PRN

## 2023-01-27 NOTE — ED Provider Notes (Signed)
[facility] Provider Note   CSN: [id number] Arrival date & time: 01/27/23  0516     History  Chief Complaint  Patient presents with   Rectal Bleeding    Jodi Bell is a 62 y.o. female.  62 yo F with a cc of bloody stool.  Maroon colored.  Going on for about  3-4 days now.  Usually constipated but having multiple stools a day. Feeling lightheaded.  Some abdominal cramping but no pain.   On baby aspirin but stopped at the onset of bleeding.  Denies any missed dialysis sessions   Rectal Bleeding      Home Medications Prior to Admission medications   Medication Sig Start Date End Date Taking? Authorizing Provider  acetaminophen (TYLENOL) 500 MG tablet Take 1,000 mg by mouth daily as needed (pain).    [provider]  albuterol (PROVENTIL) (2.5 MG/3ML) 0.083% nebulizer solution Take 3 mLs (2.5 mg total) by nebulization every 6 (six) hours as needed for wheezing or shortness of breath. 07/10/21   Hunsucker, Lesia Sago, MD  allopurinol (ZYLOPRIM) 100 MG tablet Take 100 mg by mouth every evening.    [provider]  Artificial Tear Solution (TEARS NATURALE OP) Place 1 drop into both eyes as needed (dry eye).    [provider]  aspirin 81 MG chewable tablet Chew 1 tablet (81 mg total) by mouth every other day. 01/07/23   Monna Fam, MD  busPIRone (BUSPAR) 5 MG tablet Take 5 mg by mouth 3 (three) times daily. Patient not taking: Reported on 01/05/2023 01/01/23 01/01/24  [provider]  carvedilol (COREG) 25 MG tablet Take 1 tablet (25 mg total) by mouth 2 (two) times daily with a meal. 12/25/22   Janetta Hora, PA-C  cetirizine (ZYRTEC) 10 MG tablet Take 10 mg by mouth daily as needed for allergies.    [provider]  Cholecalciferol (VITAMIN D) 50 MCG (2000 UT) tablet Take 2,000 Units by mouth daily. Patient not taking: Reported on 01/05/2023    [provider]   cinacalcet (SENSIPAR) 30 MG tablet Take 60 mg by mouth every Monday, Wednesday, and Friday with hemodialysis. Pt states she takes 2 tab on Mon, 3 tab on Wed, and 2 tab on Fri.    [provider]  cloNIDine (CATAPRES - DOSED IN MG/24 HR) 0.3 mg/24hr patch Place 0.3 mg onto the skin every Sunday.    [provider]  conjugated estrogens (PREMARIN) vaginal cream Place vaginally daily as needed. 01/07/23   Monna Fam, MD  diclofenac Sodium (VOLTAREN) 1 % GEL Apply 2 g topically daily as needed (pain).    [provider]  docusate sodium (COLACE) 100 MG capsule Take 100 mg by mouth daily.    [provider]  EPINEPHrine 0.3 mg/0.3 mL IJ SOAJ injection Inject into the muscle as needed for anaphylaxis. 07/12/21   [provider]  fluticasone (FLONASE) 50 MCG/ACT nasal spray Place 1 spray into both nostrils daily as needed for allergies or rhinitis.    [provider]  hydrocortisone 2.5%-nystatin-zinc oxide 20% 1:1:1 ointment mixture Apply topically daily as needed. 01/07/23   Monna Fam, MD  isosorbide mononitrate (IMDUR) 60 MG 24 hr tablet Take 1 tablet (60 mg total) by mouth daily. 10/27/22   Jake Bathe, MD  LORazepam (ATIVAN) 0.5 MG tablet Take 0.5 mg by mouth every 8 (eight) hours as needed for anxiety.    [provider]  montelukast (  SINGULAIR) 10 MG tablet Take 10 mg by mouth daily with supper.     [provider]  multivitamin (RENA-VIT) TABS tablet Take 1 tablet by mouth every Monday, Wednesday, and Friday.    [provider]  neomycin-polymyxin b-dexamethasone (MAXITROL) 3.5-10000-0.1 SUSP Place 1 drop into both eyes in the morning, at noon, in the evening, and at bedtime. 12/18/22   [provider]  NIFEdipine (PROCARDIA XL/NIFEDICAL XL) 60 MG 24 hr tablet Take 60 mg by mouth 2 (two) times daily. 10/01/21   [provider]  olmesartan (BENICAR) 40 MG tablet Take 40 mg by mouth every evening.  05/06/19   [provider]  ondansetron (ZOFRAN) 4 MG tablet Take 4 mg by mouth 2 (two) times daily as needed for nausea or vomiting.    [provider]  OXYGEN Inhale 2-3 L into the lungs continuous.    [provider]  pantoprazole (PROTONIX) 40 MG tablet Take 40 mg by mouth 2 (two) times daily. 06/09/20   [provider]  prednisoLONE acetate (PRED FORTE) 1 % ophthalmic suspension Place 1 drop into both eyes every 4 (four) hours. 12/31/22   [provider]  predniSONE (DELTASONE) 5 MG tablet Take 1 tablet (5 mg total) by mouth daily with breakfast. Patient taking differently: Take 5 mg by mouth daily. 02/27/22   Marrianne Mood, MD  Probiotic Product (PROBIOTIC PO) Take 1 capsule by mouth daily.    [provider]  silver sulfADIAZINE (SILVADENE) 1 % cream Apply 1 Application topically daily as needed (wound care).    [provider]  Tiotropium Bromide-Olodaterol (STIOLTO RESPIMAT) 2.5-2.5 MCG/ACT AERS Inhale 2 puffs once daily into the lungs. 11/28/21   Hunsucker, Lesia Sago, MD  VENTOLIN HFA 108 (90 Base) MCG/ACT inhaler Inhale 2 puffs into the lungs See admin instructions. Take 2 puffs every 4-6 hours as needed for wheezing and shortness of breath 04/08/22   Hunsucker, Lesia Sago, MD  zolpidem (AMBIEN) 5 MG tablet Take 5 mg by mouth at bedtime as needed for sleep. 02/04/21   [provider]      Allergies    3-methyl-2-benzothiazolinone hydrazone, Banana, Black walnut flavoring agent (non-screening), Hazelnut (filbert), Leflunomide and related, Lisinopril, No healthtouch food allergies, Other, Pecan nut (diagnostic), Trazodone and nefazodone, Adalimumab, Escitalopram oxalate, Ezetimibe, Secukinumab, Statins, Ferrlecit [na ferric gluc cplx in sucrose], Gabapentin, and Nsaids    Review of Systems   Review of Systems  Gastrointestinal:  Positive for hematochezia.    Physical Exam Updated Vital Signs BP 128/61   Pulse 66    Temp 98.6 F (37 C) (Oral)   Resp 14   SpO2 98%  Physical Exam Vitals and nursing note reviewed.  Constitutional:      General: She is not in acute distress.    Appearance: She is well-developed. She is not diaphoretic.  HENT:     Head: Normocephalic and atraumatic.  Eyes:     Pupils: Pupils are equal, round, and reactive to light.  Cardiovascular:     Rate and Rhythm: Normal rate and regular rhythm.     Heart sounds: No murmur heard.    No friction rub. No gallop.  Pulmonary:     Effort: Pulmonary effort is normal.     Breath sounds: No wheezing or rales.  Abdominal:     General: There is no distension.     Palpations: Abdomen is soft.     Tenderness: There is no abdominal tenderness.  Musculoskeletal:  General: No tenderness.     Cervical back: Normal range of motion and neck supple.  Skin:    General: Skin is warm and dry.  Neurological:     Mental Status: She is alert and oriented to person, place, and time.  Psychiatric:        Behavior: Behavior normal.     ED Results / Procedures / Treatments   Labs (all labs ordered are listed, but only abnormal results are displayed) Labs Reviewed  CBC WITH DIFFERENTIAL/PLATELET - Abnormal; Notable for the following components:      Result Value   RBC 1.77 (*)    Hemoglobin 5.7 (*)    HCT 19.1 (*)    MCV 107.9 (*)    MCHC 29.8 (*)    RDW 19.0 (*)    Platelets 80 (*)    Lymphs Abs 0.6 (*)    Eosinophils Absolute 0.9 (*)    All other components within normal limits  BASIC METABOLIC PANEL - Abnormal; Notable for the following components:   Glucose, Bld 147 (*)    BUN 32 (*)    Creatinine, Ser 4.06 (*)    Calcium 8.4 (*)    GFR, Estimated 12 (*)    All other components within normal limits    EKG None  Radiology No results found.  Procedures .Critical Care  Performed by: Melene Plan, DO Authorized by: Melene Plan, DO   Critical care provider statement:    Critical care time (minutes):  35   Critical  care time was exclusive of:  Separately billable procedures and treating other patients   Critical care was time spent personally by me on the following activities:  Development of treatment plan with patient or surrogate, discussions with consultants, evaluation of patient's response to treatment, examination of patient, ordering and review of laboratory studies, ordering and review of radiographic studies, ordering and performing treatments and interventions, pulse oximetry, re-evaluation of patient's condition and review of old charts   Care discussed with: admitting provider       Medications Ordered in ED Medications  pantoprazole (PROTONIX) injection 40 mg (40 mg Intravenous Given 01/27/23 0543)    ED Course/ Medical Decision Making/ A&P                                 Medical Decision Making Amount and/or Complexity of Data Reviewed Labs: ordered.  Risk Prescription drug management. Decision regarding hospitalization.   62 yo F with a  cc of rectal bleeding.  By history sounds like melena.  Patient with hx of recurrent GI bleeds though usually thought to be lower.   Will obtain lab eval.   Hgb 5.7.  BUN elevated from baseline.  Secure chat message sent to Dr. Dulce Sellar, [facility] per hospital protocol.  Discussed with Dr. Pilar Plate, accepts patient in ED to ED transfer as we are boarding and she needs transfusion.  I placed a consult order to the hospitalist service but have not gotten a return call.  May need to be repaged when she gets to [facility].   The patients results and plan were reviewed and discussed.   Any x-rays performed were independently reviewed by myself.   Differential diagnosis were considered with the presenting HPI.  Medications  pantoprazole (PROTONIX) injection 40 mg (40 mg Intravenous Given 01/27/23 0543)    Vitals:   01/27/23 0518 01/27/23 0523 01/27/23 0530  BP:  128/61   Pulse:  66   Resp:  14   Temp:  98.6 F (37 C)   TempSrc:  Oral   SpO2: 98%  99% 98%    Final diagnoses:  Rectal bleeding    Admission/ observation were discussed with the admitting physician, patient and/or family and they are comfortable with the plan.          Final Clinical Impression(s) / ED Diagnoses Final diagnoses:  Rectal bleeding    Rx / DC Orders ED Discharge Orders     None         Melene Plan, DO 01/27/23 1478

## 2023-01-27 NOTE — ED Notes (Signed)
ED Provider at bedside. 

## 2023-01-27 NOTE — Consult Note (Signed)
Renal Service Consult Note [facility]  Jodi [facility]odi Bell 01/28/2023 Jodi Jodi Bell, Jodi Bell Requesting Physician: Jodi Jodi Bell  Reason for Consult: ESRD pt w/ bloody stool, GIB HPI: The patient is a 62 y.o. year-old w/ PMH as below who presented to ED w/ c/o maroon colored stools for last 1 day. No fevers. No CP or SOB. Poor appetite w/ some nausea, some vomiting. In ED Hb was 5.7. Prbc's were ordered and pt was admitted. We are asked to see for dialysis.   Pt seen in room. No c/o's at this time.   ROS - denies CP, no joint pain, no HA, no blurry vision, no rash, no diarrhea, no nausea/ vomiting, no dysuria, no difficulty voiding   Past Medical History  Past Medical History:  Diagnosis Date   Anemia of chronic disease    Anxiety    Arthritis    oa and psoriatic ra   Asthma    Back pain, chronic    lower back   Chronic insomnia    COPD (chronic obstructive pulmonary disease) (HCC)    Diabetes mellitus    type 2   ESRD on dialysis [facility])    Essential hypertension    GERD (gastroesophageal reflux disease)    Gout    History of hiatal hernia    Hypoalbuminemia    Hyponatremia    Mild aortic stenosis    Mitral regurgitation    Mitral stenosis    Obesity    PAD (peripheral artery disease) (HCC)    a. externial iliac calcification seen on CT 04/2020.   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   Sleep apnea    Tobacco abuse    Upper GI bleed 05/2019   Past Surgical History  Past Surgical History:  Procedure Laterality Date   BACK SURGERY  2016   lower back fusion with cage   BIOPSY  09/23/2017   Procedure: BIOPSY;  Surgeon: Jodi Jodi Bell, Jodi Bell;  Location: [facility] ENDOSCOPY;  Service: Endoscopy;;   BIOPSY  05/19/2019   Procedure: BIOPSY;  Surgeon: Jodi Jodi Bell, Jodi Bell;  Location: [facility] ENDOSCOPY;  Service: Gastroenterology;;   CESAREAN SECTION  1990   x 1     COLONOSCOPY WITH PROPOFOL N/A 09/23/2017   Procedure: COLONOSCOPY WITH PROPOFOL Hemostatic clips placed;  Surgeon: Jodi Jodi Bell, Jodi Bell;  Location: [facility] ENDOSCOPY;  Service: Endoscopy;  Laterality: N/A;   COLONOSCOPY WITH PROPOFOL N/A 10/02/2020   Procedure: COLONOSCOPY WITH PROPOFOL;  Surgeon: Jodi Jodi Bell, Jodi Bell;  Location: [facility] ENDOSCOPY;  Service: Endoscopy;  Laterality: N/A;   CORONARY ARTERY BYPASS GRAFT N/A 09/13/2020   Procedure: CORONARY ARTERY BYPASS GRAFTING (CABG), ON PUMP, TIMES TWO, USING LEFT INTERNAL MAMMARY ARTERY AND RIGHT ENDOSCOPICALLY HARVESTED GREATER SAPHENOUS VEIN;  Surgeon: Jodi Jodi Bell, Jodi Bell;  Location: [facility] OR;  Service: Open Heart Surgery;  Laterality: N/A;   DILATION AND CURETTAGE OF UTERUS  1988   ENTEROSCOPY N/A 07/22/2021   Procedure: ENTEROSCOPY;  Surgeon: Jodi Jodi Bell, Jodi Bell;  Location: [facility] ENDOSCOPY;  Service: Gastroenterology;  Laterality: N/A;   ESOPHAGOGASTRODUODENOSCOPY N/A 09/22/2020   Procedure: ESOPHAGOGASTRODUODENOSCOPY (EGD);  Surgeon: Jodi Jodi Bell, Jodi Bell;  Location: [facility] ENDOSCOPY;  Service: Endoscopy;  Laterality: N/A;   ESOPHAGOGASTRODUODENOSCOPY (EGD) WITH PROPOFOL N/A 09/23/2017   Procedure: ESOPHAGOGASTRODUODENOSCOPY (EGD) WITH PROPOFOL;  Surgeon: Jodi Jodi Bell, Jodi Bell;  Location: [facility]OS[facility];  Service: Endoscopy;  Laterality: N/A;   ESOPHAGOGASTRODUODENOSCOPY (EGD) WITH PROPOFOL N/A  05/19/2019   Procedure: ESOPHAGOGASTRODUODENOSCOPY (EGD) WITH PROPOFOL;  Surgeon: Jodi Jodi Bell, Jodi Bell;  Location: [facility]OS[facility];  Service: Gastroenterology;  Laterality: N/A;   ESOPHAGOGASTRODUODENOSCOPY (EGD) WITH PROPOFOL N/A 10/02/2020   Procedure: ESOPHAGOGASTRODUODENOSCOPY (EGD) WITH PROPOFOL;  Surgeon: Jodi Jodi Bell, Jodi Bell;  Location: [facility] ENDOSCOPY;  Service: Endoscopy;  Laterality: N/A;   GIVENS CAPSULE STUDY N/A 05/19/2019   Procedure: GIVENS CAPSULE STUDY;  Surgeon: Jodi Jodi Bell, Jodi Bell;  Location: [facility] ENDOSCOPY;  Service: Gastroenterology;  Laterality: N/A;   GIVENS CAPSULE STUDY N/A  07/19/2021   Procedure: GIVENS CAPSULE STUDY;  Surgeon: Jodi Jodi Bell, Jodi Bell;  Location: [facility] ENDOSCOPY;  Service: Gastroenterology;  Laterality: N/A;   HEMOSTASIS CLIP PLACEMENT  09/22/2020   Procedure: HEMOSTASIS CLIP PLACEMENT;  Surgeon: Jodi Jodi Bell, Jodi Bell;  Location: [facility] ENDOS[facility];  Service: Endoscopy;;   HEMOSTASIS CONTROL  09/22/2020   Procedure: HEMOSTASIS CONTROL;  Surgeon: Jodi Jodi Bell, Jodi Bell;  Location: [facility] ENDOSCOPY;  Service: Endoscopy;;   HOT HEMOSTASIS N/A 05/19/2019   Procedure: HOT HEMOSTASIS (ARGON PLASMA COAGULATION/BICAP);  Surgeon: Jodi Jodi Bell, Jodi Bell;  Location: [facility] ENDOSCOPY;  Service: Gastroenterology;  Laterality: N/A;   INTRAOPERATIVE TRANSTHORACIC ECHOCARDIOGRAM N/A 12/23/2022   Procedure: INTRAOPERATIVE TRANSTHORACIC ECHOCARDIOGRAM;  Surgeon: Jodi Jodi Bell, Jodi Bell;  [facility]: [facility] INVASIVE CV LAB;  Service: Cardiovascular;  Laterality: N/A;   LEFT HEART CATH AND CORONARY ANGIOGRAPHY N/A 09/10/2020   Procedure: LEFT HEART CATH AND CORONARY ANGIOGRAPHY;  Surgeon: Jodi Kendall, Jodi Bell;  [facility]: [facility] INVASIVE CV LAB;  Service: Cardiovascular;  Laterality: N/A;   PARTIAL KNEE ARTHROPLASTY Left 11/12/2017   Procedure: left unicompartmental arthroplasty-medial;  Surgeon: Jodi Romans, Jodi Bell;  Location: [facility] ORS;  Service: Orthopedics;  Laterality: Left;    RIGHT HEART CATH AND CORONARY/GRAFT ANGIOGRAPHY N/A 06/26/2022   Procedure: RIGHT HEART CATH AND CORONARY/GRAFT ANGIOGRAPHY;  Surgeon: Jodi Jodi Bell, Jodi Bell;  [facility]: [facility] INVASIVE CV LAB;  Service: Cardiovascular;  Laterality: N/A;   SUBMUCOSAL INJECTION  09/23/2017   Procedure: SUBMUCOSAL INJECTION;  Surgeon: Jodi Jodi Bell, Jodi Bell;  Location: [facility] ENDOSCOPY;  Service: Endoscopy;;  in colon   TEE WITHOUT CARDIOVERSION N/A 09/13/2020   Procedure: TRANSESOPHAGEAL ECHOCARDIOGRAM (TEE);  Surgeon: Jodi Jodi Bell, Jodi Bell;  Location: [facility] OR;  Service: Open Heart Surgery;  Laterality: N/A;   TUBAL LIGATION  1990   Family History  Family History  Problem  Relation Age of Onset   Lung cancer Mother    Diabetes Sister    Heart disease Sister    Asthma Daughter    Arthritis Daughter    Arthritis Daughter    Thyroid cancer Other        1/2 sister   Heart disease Other        1/2 sister   Social History  reports that she quit smoking about 2 years ago. Her smoking use included cigarettes. She started smoking about 42 years ago. She has a 20 pack-year smoking history. She has never used smokeless tobacco. She reports current alcohol use. She reports that she does not use drugs. Allergies  Allergies  Allergen Reactions   3-Methyl-2-Benzothiazolinone Hydrazone Other (See Comments)    Muscle weakness muscle cramping in legs Other reaction(s): Myalgias (Muscle Pain)   Banana Anaphylaxis, Swelling and Other (See Comments)    TONGUE AND MOUTH SWELLING   Black Buyer, retail (Non-Screening) Anaphylaxis and Itching    Walnuts   Hazelnut (Filbert) Anaphylaxis    Hazelnuts   Leflunomide And Related Other (See Comments)    Severe Headache   Lisinopril Swelling    Angioedema  Swelling  of the face   No [employer] Food Allergies Anaphylaxis    Spicy Mustard 4.7.2021 Pt reports that she eats Regular Yellow Mustard Swelling and itching of tongue   Other Anaphylaxis, Swelling and Other (See Comments)    Cosentyx= angioedema Mouth itches and tongue swelling Hazel nuts and pecans also Walnuts Renal failure Serotonin syndrome  Tremors Muscle weakness muscle cramping in legs Other reaction(s): Myalgias (Muscle Pain)   Pecan Nut (Diagnostic) Anaphylaxis and Itching    Pecans   Trazodone And Nefazodone Other (See Comments)    Serotonin Syndrome  Tremors   Adalimumab Other (See Comments)    Blisters on abdomen =humira   Escitalopram Oxalate Other (See Comments)    Hand problems - Serotonin Syndrome     Ezetimibe Other (See Comments)    myalgias cramps    Secukinumab Swelling    Cosentyx = angiodema    Statins Other (See  Comments)    Leg pains and weakness   Ferrlecit [Na Ferric Gluc Cplx In Sucrose]     Not reaction given from HD   Gabapentin Other (See Comments)    tremors   Nsaids     Avoid due to renal problems    Home medications Prior to Admission medications   Medication Sig Start Date End Date Taking? Authorizing Provider  acetaminophen (TYLENOL) 500 MG tablet Take 1,000 mg by mouth daily as needed (pain).   Yes Provider, Historical, Jodi Bell  albuterol (PROVENTIL) (2.5 MG/3ML) 0.083% nebulizer solution Take 3 mLs (2.5 mg total) by nebulization every 6 (six) hours as needed for wheezing or shortness of breath. 07/10/21  Yes Hunsucker, Jodi Sago, Jodi Bell  allopurinol (ZYLOPRIM) 100 MG tablet Take 100 mg by mouth every evening.   Yes Provider, Historical, Jodi Bell  Artificial Tear Solution (TEARS NATURALE OP) Place 1 drop into both eyes as needed (dry eye).   Yes Provider, Historical, Jodi Bell  aspirin 81 MG chewable tablet Chew 1 tablet (81 mg total) by mouth every other day. 01/07/23  Yes Monna Fam, Jodi Bell  busPIRone (BUSPAR) 5 MG tablet Take 5 mg by mouth 3 (three) times daily. 01/01/23 01/01/24 Yes Provider, Historical, Jodi Bell  carvedilol (COREG) 25 MG tablet Take 1 tablet (25 mg total) by mouth 2 (two) times daily with a meal. 12/25/22  Yes Janetta Hora, Bell-C  cetirizine (ZYRTEC) 10 MG tablet Take 10 mg by mouth daily as needed for allergies.   Yes Provider, Historical, Jodi Bell  Cholecalciferol (VITAMIN D) 50 MCG (2000 UT) tablet Take 2,000 Units by mouth daily.   Yes Provider, Historical, Jodi Bell  cinacalcet (SENSIPAR) 30 MG tablet Take 60 mg by mouth every Monday, Wednesday, and Friday with hemodialysis. Pt states she takes 2 tab on Mon, 3 tab on Wed, and 2 tab on Fri.   Yes Provider, Historical, Jodi Bell  cloNIDine (CATAPRES - DOSED IN MG/24 HR) 0.3 mg/24hr patch Place 0.3 mg onto the skin every Sunday.   Yes Provider, Historical, Jodi Bell  conjugated estrogens (PREMARIN) vaginal cream Place vaginally daily as needed. 01/07/23  Yes Monna Fam, Jodi Bell  diclofenac Sodium (VOLTAREN) 1 % GEL Apply 2 g topically daily as needed (pain).   Yes Provider, Historical, Jodi Bell  docusate sodium (COLACE) 100 MG capsule Take 100 mg by mouth daily as needed for moderate constipation.   Yes Provider, Historical, Jodi Bell  EPINEPHrine 0.3 mg/0.3 mL IJ SOAJ injection Inject into the muscle as needed for anaphylaxis. 07/12/21  Yes Provider, Historical, Jodi Bell  fluticasone (FLONASE) 50 MCG/ACT nasal spray Place 1 spray into  both nostrils daily as needed for allergies or rhinitis.   Yes Provider, Historical, Jodi Bell  hydrocortisone 2.5%-nystatin-zinc oxide 20% 1:1:1 ointment mixture Apply topically daily as needed. 01/07/23  Yes Monna Fam, Jodi Bell  isosorbide mononitrate (IMDUR) 60 MG 24 hr tablet Take 1 tablet (60 mg total) by mouth daily. 10/27/22  Yes Jodi Bathe, Jodi Bell  LORazepam (ATIVAN) 0.5 MG tablet Take 0.5 mg by mouth every 8 (eight) hours as needed for anxiety.   Yes Provider, Historical, Jodi Bell  montelukast (SINGULAIR) 10 MG tablet Take 10 mg by mouth daily with supper.    Yes Provider, Historical, Jodi Bell  multivitamin (RENA-VIT) TABS tablet Take 1 tablet by mouth every Monday, Wednesday, and Friday.   Yes Provider, Historical, Jodi Bell  NIFEdipine (PROCARDIA XL/NIFEDICAL XL) 60 MG 24 hr tablet Take 60 mg by mouth 2 (two) times daily. 10/01/21  Yes Provider, Historical, Jodi Bell  olmesartan (BENICAR) 40 MG tablet Take 40 mg by mouth every evening. 05/06/19  Yes Provider, Historical, Jodi Bell  ondansetron (ZOFRAN) 4 MG tablet Take 4 mg by mouth 2 (two) times daily as needed for nausea or vomiting.   Yes Provider, Historical, Jodi Bell  pantoprazole (PROTONIX) 40 MG tablet Take 40 mg by mouth 2 (two) times daily. 06/09/20  Yes Provider, Historical, Jodi Bell  prednisoLONE acetate (PRED FORTE) 1 % ophthalmic suspension Place 1 drop into both eyes every 4 (four) hours. 12/31/22  Yes Provider, Historical, Jodi Bell  predniSONE (DELTASONE) 5 MG tablet Take 1 tablet (5 mg total) by mouth daily with breakfast. 02/27/22  Yes  Marrianne Mood, Jodi Bell  Probiotic Product (PROBIOTIC PO) Take 1 capsule by mouth daily.   Yes Provider, Historical, Jodi Bell  silver sulfADIAZINE (SILVADENE) 1 % cream Apply 1 Application topically daily as needed (wound care).   Yes Provider, Historical, Jodi Bell  Tiotropium Bromide-Olodaterol (STIOLTO RESPIMAT) 2.5-2.5 MCG/ACT AERS Inhale 2 puffs once daily into the lungs. 11/28/21  Yes Hunsucker, Jodi Sago, Jodi Bell  VENTOLIN HFA 108 (90 Base) MCG/ACT inhaler Inhale 2 puffs into the lungs See admin instructions. Take 2 puffs every 4-6 hours as needed for wheezing and shortness of breath 04/08/22  Yes Hunsucker, Jodi Sago, Jodi Bell  zolpidem (AMBIEN) 5 MG tablet Take 5 mg by mouth at bedtime as needed for sleep. 02/04/21  Yes Provider, Historical, Jodi Bell  neomycin-polymyxin b-dexamethasone (MAXITROL) 3.5-10000-0.1 SUSP Place 1 drop into both eyes in the morning, at noon, in the evening, and at bedtime. Patient not taking: Reported on 01/27/2023 12/18/22   Provider, Historical, Jodi Bell  OXYGEN Inhale 2-3 L into the lungs continuous.    Provider, Historical, Jodi Bell     Vitals:   01/28/23 0048 01/28/23 0346 01/28/23 0559 01/28/23 0801  BP: (!) 190/66 (!) 164/67 (!) 174/68 (!) 169/61  Pulse: 65 71 76 70  Resp: 18  17 17   Temp: 98.4 F (36.9 C) 98.5 F (36.9 C) 97.9 F (36.6 C)   TempSrc: Oral Oral Oral   SpO2: 100% 100% 100% 100%  Height:       Exam Gen alert, no distress, 2 L [place] O2 No rash, cyanosis or gangrene Sclera anicteric, throat clear  No jvd or bruits Chest clear bilat to bases, no rales/ wheezing RRR no MRG Abd soft ntnd no mass or ascites +bs GU defer MS no joint effusions or deformity Ext no LE or UE edema, no wounds or ulcers Neuro is alert, Ox 3 , nf    LUA AVF+bruit       Renal-related home meds: - coreg 25 bid, clonidine patch 0.3,  olmesartan 40, procardia xl 60 bid - home O2 2-3 L - prednisone 5 qd - imdur 60 qd   OP HD: MWF [facility] on [address] From nov 2024 --> 3.5h  58.5kg,   300/600  Hep none  L AVF   Assessment/ Plan: Acute blood loss anemia - hx of GIB, maroon stools recently. Hb 5.7, b/l 7- 8. GI consulted, 2u prbc's ordered and PPI IV bid started.  ESKD - on HD MWF. Last HD Monday. Plan HD today.  HTN - bp's on the high side, taking multiple HTN meds at home. Continue.   Volume - euvolemic on exam. Get wt's. UF 2-2.5 L w/ HD today.  Anemia of eskd+ GI bleed- Hb 5.7 on admission, up to 8.2 today after prbcs'. Get esa records.  MBD ckd - CCa and phos are in range. Follow.  COPD - 2-3 L home O2 H/o TAVR      Vinson Moselle  Jodi Bell CKA 01/28/2023, 9:11 AM  Recent Labs  Lab 01/27/23 0535 01/27/23 2248 01/28/23 0449  HGB 5.7* 7.3* 8.2*  ALBUMIN  --   --  2.8*  CALCIUM 8.4*  --  8.6*  PHOS  --   --  4.1  CREATININE 4.06*  --  5.31*  K 4.0  --  4.3   Inpatient medications:  allopurinol  100 mg Oral QHS   busPIRone  5 mg Oral TID   cinacalcet  60 mg Oral Q M,W,F-HD   irbesartan  300 mg Oral QHS   isosorbide mononitrate  60 mg Oral Daily   NIFEdipine  60 mg Oral BID   pantoprazole (PROTONIX) IV  40 mg Intravenous Q12H   predniSONE  5 mg Oral Q breakfast   umeclidinium-vilanterol  1 puff Inhalation Daily    acetaminophen **OR** acetaminophen, albuterol, zolpidem

## 2023-01-27 NOTE — ED Provider Notes (Signed)
  Physical Exam  BP (!) 155/68   Pulse 67   Temp 98.5 F (36.9 C) (Oral)   Resp 14   SpO2 100%   Physical Exam  Procedures  .Critical Care  Performed by: Derwood Kaplan, MD Authorized by: Derwood Kaplan, MD   Critical care provider statement:    Critical care time (minutes):  35   Critical care was necessary to treat or prevent imminent or life-threatening deterioration of the following conditions:  Circulatory failure   Critical care was time spent personally by me on the following activities:  Development of treatment plan with patient or surrogate, discussions with consultants, evaluation of patient's response to treatment, examination of patient, ordering and review of laboratory studies, ordering and review of radiographic studies, ordering and performing treatments and interventions, pulse oximetry, re-evaluation of patient's condition, review of old charts and obtaining history from patient or surrogate   ED Course / MDM    Medical Decision Making Amount and/or Complexity of Data Reviewed Labs: ordered.  Risk Prescription drug management. Decision regarding hospitalization.    Assuming care of patient from Dr. Adela Lank   Patient in the ED for GI bleed in the setting of patient having ESRD on hemodialysis -M-W-F. Patient has known history of previous GI bleed.  She is currently not on any anticoagulation.  She indicates that she has not taken aspirin recently.  Patient started having weakness and dizziness with GI bleed prompting her to go to the ER.  She is found to have a hemoglobin of 5.7.  Baseline is around 8.  Workup thus far shows hemoglobin of 5.7.  Patient has normal potassium.  Patient is comfortable at this time.  She is consented for transfusion.  We will give her 2 units of PRBCs.  I reviewed previous visits from GI and from discharge summary from November. Patient has history of CAD, ESRD, COPD and was recently diagnosed for demand ischemia.  Patient  also has thrombocytopenia.  Platelet count is at 80,000.  She is not actively bleeding, therefore we will hold DDAVP at this time.  I have consulted through secure chat to both nephrology for impending dialysis needs and also to Dr. Dulce Sellar, GI about patient being in the ER now.  Currently patient is hemodynamically stable.  She has consented to 2 units of PRBC.  We will transfuse the blood slowly because of her ESRD history.        Derwood Kaplan, MD 01/27/23 [phone]

## 2023-01-27 NOTE — H&P (Signed)
Date: 01/27/2023               Patient Name:  Jodi Bell MRN: [id number]  DOB: 10/18/1960 Age / Sex: 62 y.o., female   PCP: Malka So., MD         Medical Service: Internal Medicine Teaching Service         Attending Physician: Dr. Reymundo Poll, MD      First Contact: Dr. [name], DO Pager [phone]    Second Contact: Dr. Marrianne Mood, MD Pager [phone]         After Hours (After 5p/  First Contact Pager: [phone]  weekends / holidays): Second Contact Pager: [phone]   SUBJECTIVE   Chief Complaint: bloody stool  History of Present Illness: Jodi Bell is a 62 yo female with diabetes, ESRD on HD MWF, COPD on 3 L [place], aortic stenosis s/p TAVR (November 2024), and CAD s/p CABG (2022) who presents to the hospital with maroon colored stool x 4 days.   The patient states that she has had GI bleeding before that was treated endoscopically. For the last few days she has noticed maroon colored stools, noting that yesterday she had 6 episodes blood tinged stool. When she first noticed the bleeding she stopped taking her aspirin, but the bleeding persisted. She denies any fevers, chills, chest pain, or worsening SOB beyond her baseline. The patient states that she did get lightheaded today, which prompted her to call EMS. Denies syncope or falls.   The patient also endorses intermittent abd pain around the umbilicus and some nausea. She denies vomiting, but states that her appetite has been limited due to nausea. She does still produce some urine, but  denies any hematuria or dysuria. She denies any NSAID or alcohol use.   Of note, she was recently hospitalized from 11/22-11/27 with a drop in her hemoglobin, although there were no signs of ongoing bleeding at that time. She received two units of PRBCs and was recommended to continue her PPI twice daily without any planned endoscopic interventions.    Meds:  Tylenol  Albuterol Allopurinol 100 mg at bedtime Aspirin 81  mg daily Buspirone 5 mg TID Coreg 25 mg BID Zyrtec PRN Cinacalcet 60 mg MWF  Clonidine 0.3 mg/24 hr patch once a week Colace 100 mg daily Flonase Imdur 60 mg daily Ativan 0.5 mg q8h PRN Montelukast 10 mg daily Multivitamin MWF Nifedipine 60 mg BID Olmesartan 40 mg daily Protonix 40 mg bid Prednisone 5 mg daily Vit D 2,000U daily Zolpidem 5 mg qhs No outpatient medications have been marked as taking for the 01/27/23 encounter [facility] Encounter).    Past Medical History  Past Surgical History:  Procedure Laterality Date   BACK SURGERY  2016   lower back fusion with cage   BIOPSY  09/23/2017   Procedure: BIOPSY;  Surgeon: Graylin Shiver, MD;  Location: [facility] ENDOSCOPY;  Service: Endoscopy;;   BIOPSY  05/19/2019   Procedure: BIOPSY;  Surgeon: Kathi Der, MD;  Location: [facility] ENDOSCOPY;  Service: Gastroenterology;;   CESAREAN SECTION  1990   x 1    COLONOSCOPY WITH PROPOFOL N/A 09/23/2017   Procedure: COLONOSCOPY WITH PROPOFOL Hemostatic clips placed;  Surgeon: Graylin Shiver, MD;  Location: [facility] ENDOSCOPY;  Service: Endoscopy;  Laterality: N/A;   COLONOSCOPY WITH PROPOFOL N/A 10/02/2020   Procedure: COLONOSCOPY WITH PROPOFOL;  Surgeon: Willis Modena, MD;  Location: [facility] ENDOSCOPY;  Service: Endoscopy;  Laterality: N/A;   CORONARY ARTERY BYPASS GRAFT N/A  09/13/2020   Procedure: CORONARY ARTERY BYPASS GRAFTING (CABG), ON PUMP, TIMES TWO, USING LEFT INTERNAL MAMMARY ARTERY AND RIGHT ENDOSCOPICALLY HARVESTED GREATER SAPHENOUS VEIN;  Surgeon: Alleen Borne, MD;  Location: [facility] OR;  Service: Open Heart Surgery;  Laterality: N/A;   DILATION AND CURETTAGE OF UTERUS  1988   ENTEROSCOPY N/A 07/22/2021   Procedure: ENTEROSCOPY;  Surgeon: Kerin Salen, MD;  Location: [facility] ENDOSCOPY;  Service: Gastroenterology;  Laterality: N/A;   ESOPHAGOGASTRODUODENOSCOPY N/A 09/22/2020   Procedure: ESOPHAGOGASTRODUODENOSCOPY (EGD);  Surgeon: Willis Modena, MD;  Location: [facility] ENDOSCOPY;  Service: Endoscopy;   Laterality: N/A;   ESOPHAGOGASTRODUODENOSCOPY (EGD) WITH PROPOFOL N/A 09/23/2017   Procedure: ESOPHAGOGASTRODUODENOSCOPY (EGD) WITH PROPOFOL;  Surgeon: Graylin Shiver, MD;  Location: [facility] ENDOS[facility];  Service: Endoscopy;  Laterality: N/A;   ESOPHAGOGASTRODUODENOSCOPY (EGD) WITH PROPOFOL N/A 05/19/2019   Procedure: ESOPHAGOGASTRODUODENOSCOPY (EGD) WITH PROPOFOL;  Surgeon: Kathi Der, MD;  Location: [facility]OS[facility];  Service: Gastroenterology;  Laterality: N/A;   ESOPHAGOGASTRODUODENOSCOPY (EGD) WITH PROPOFOL N/A 10/02/2020   Procedure: ESOPHAGOGASTRODUODENOSCOPY (EGD) WITH PROPOFOL;  Surgeon: Willis Modena, MD;  Location: [facility] ENDOSCOPY;  Service: Endoscopy;  Laterality: N/A;   GIVENS CAPSULE STUDY N/A 05/19/2019   Procedure: GIVENS CAPSULE STUDY;  Surgeon: Kathi Der, MD;  Location: [facility] ENDOSCOPY;  Service: Gastroenterology;  Laterality: N/A;   GIVENS CAPSULE STUDY N/A 07/19/2021   Procedure: GIVENS CAPSULE STUDY;  Surgeon: [name], MD;  Location: [facility] ENDOSCOPY;  Service: Gastroenterology;  Laterality: N/A;   HEMOSTASIS CLIP PLACEMENT  09/22/2020   Procedure: HEMOSTASIS CLIP PLACEMENT;  Surgeon: Willis Modena, MD;  Location: [facility] ENDOS[facility];  Service: Endoscopy;;   HEMOSTASIS CONTROL  09/22/2020   Procedure: HEMOSTASIS CONTROL;  Surgeon: Willis Modena, MD;  Location: [facility] ENDOSCOPY;  Service: Endoscopy;;   HOT HEMOSTASIS N/A 05/19/2019   Procedure: HOT HEMOSTASIS (ARGON PLASMA COAGULATION/BICAP);  Surgeon: Kathi Der, MD;  Location: [facility] ENDOSCOPY;  Service: Gastroenterology;  Laterality: N/A;   INTRAOPERATIVE TRANSTHORACIC ECHOCARDIOGRAM N/A 12/23/2022   Procedure: INTRAOPERATIVE TRANSTHORACIC ECHOCARDIOGRAM;  Surgeon: [name], MD;  [facility]: [facility] INVASIVE CV LAB;  Service: Cardiovascular;  Laterality: N/A;   LEFT HEART CATH AND CORONARY ANGIOGRAPHY N/A 09/10/2020   Procedure: LEFT HEART CATH AND CORONARY ANGIOGRAPHY;  Surgeon: Yvonne Kendall, MD;  Location: [facility] INVASIVE CV LAB;   Service: Cardiovascular;  Laterality: N/A;   PARTIAL KNEE ARTHROPLASTY Left 11/12/2017   Procedure: left unicompartmental arthroplasty-medial;  Surgeon: Durene Romans, MD;  Location: [facility] ORS;  Service: Orthopedics;  Laterality: Left;    RIGHT HEART CATH AND CORONARY/GRAFT ANGIOGRAPHY N/A 06/26/2022   Procedure: RIGHT HEART CATH AND CORONARY/GRAFT ANGIOGRAPHY;  Surgeon: [name], MD;  Location: [facility] CV LAB;  Service: Cardiovascular;  Laterality: N/A;   SUBMUCOSAL INJECTION  09/23/2017   Procedure: SUBMUCOSAL INJECTION;  Surgeon: Graylin Shiver, MD;  Location: [facility] ENDOSCOPY;  Service: Endoscopy;;  in colon   TEE WITHOUT CARDIOVERSION N/A 09/13/2020   Procedure: TRANSESOPHAGEAL ECHOCARDIOGRAM (TEE);  Surgeon: Alleen Borne, MD;  Location: [facility] OR;  Service: Open Heart Surgery;  Laterality: N/A;   TUBAL LIGATION  1990    Social:  Lives With: daughter and son-in-law 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) Support: family, has 3 kids and 5 grandkids  Level of Function: independent on ADLs, ambulates with walker/cane; daughter helps with cleaning around home  PCP: Malka So., MD retired (still seeing same practice)  Substances: Former tobacco user with 50 pack year history; occassional alcohol use, no other substance use  Family History:  Hypertension  Diabetes  Allergies: Allergies as of 01/27/2023 - Review Complete 01/27/2023  Allergen Reaction Noted   3-methyl-2-benzothiazolinone hydrazone Other (See Comments) 09/07/2012   Banana Anaphylaxis, Swelling, and Other (See Comments) 01/21/2018   Black walnut flavoring agent (non-screening) Anaphylaxis and Itching 05/18/2019   Hazelnut (filbert) Anaphylaxis 05/18/2019   Leflunomide and related Other (See Comments) 07/23/2012   Lisinopril Swelling 01/05/2014   No healthtouch food allergies Anaphylaxis 05/18/2019   Other Anaphylaxis, Swelling, and Other (See Comments) 09/07/2012   Pecan nut (diagnostic) Anaphylaxis  and Itching 05/18/2019   Trazodone and nefazodone Other (See Comments) 07/23/2014   Adalimumab Other (See Comments) 07/23/2012   Escitalopram oxalate Other (See Comments) 09/29/2013   Ezetimibe Other (See Comments) 01/13/2018   Secukinumab Swelling 05/18/2019   Statins Other (See Comments) 09/04/2014   Ferrlecit [na ferric gluc cplx in sucrose]  09/19/2020   Gabapentin Other (See Comments) 06/26/2021   Nsaids  06/24/2022    Review of Systems: A complete ROS was negative except as per HPI.   OBJECTIVE:   Physical Exam: Blood pressure (!) 155/68, pulse 67, temperature 98.5 F (36.9 C), temperature source Oral, resp. rate 14, SpO2 100%.   Constitutional: well-appearing elderly female laying in bed, in no acute distress Cardiovascular: regular rate and rhythm, systolic murmur at LUSB Pulmonary/Chest: normal work of breathing on room air, lungs clear to auscultation bilaterally Abdominal: soft, mild tenderness to palpation in umbilical region of abd MSK: normal bulk and tone, LUE AVF with palpable thrill  Skin: warm and dry Psych: normal mood and affect  Labs: CBC    Component Value Date/Time   WBC 5.5 01/27/2023 0535   RBC 1.77 (L) 01/27/2023 0535   HGB 5.7 (LL) 01/27/2023 0535   HCT 19.1 (L) 01/27/2023 0535   PLT 80 (L) 01/27/2023 0535   MCV 107.9 (H) 01/27/2023 0535   MCV 94.5 10/12/2012 1211   MCH 32.2 01/27/2023 0535   MCHC 29.8 (L) 01/27/2023 0535   RDW 19.0 (H) 01/27/2023 0535   LYMPHSABS 0.6 (L) 01/27/2023 0535   MONOABS 0.3 01/27/2023 0535   EOSABS 0.9 (H) 01/27/2023 0535   BASOSABS 0.0 01/27/2023 0535     CMP     Component Value Date/Time   NA 136 01/27/2023 0535   K 4.0 01/27/2023 0535   CL 105 01/27/2023 0535   CO2 25 01/27/2023 0535   GLUCOSE 147 (H) 01/27/2023 0535   BUN 32 (H) 01/27/2023 0535   CREATININE 4.06 (H) 01/27/2023 0535   CREATININE 1.34 (H) 09/18/2011 1400   CALCIUM 8.4 (L) 01/27/2023 0535   PROT 5.0 (L) 01/02/2023 1811   ALBUMIN 2.8  (L) 01/06/2023 0435   AST 22 01/02/2023 1811   ALT 11 01/02/2023 1811   ALKPHOS 48 01/02/2023 1811   BILITOT 1.2 (H) 01/02/2023 1811   GFRNONAA 12 (L) 01/27/2023 0535   GFRAA 16 (L) 11/09/2019 1152    Imaging: None obtained  EKG: Not done yet   ASSESSMENT & PLAN:   Assessment & Plan by Problem: Principal Problem:   Symptomatic anemia   Jodi Bell is a 62 y.o. person living with a history of diabetes, ESRD on HD MWF, COPD on 3 L [place], aortic stenosis s/p TAVR (November 2024), and CAD s/p CABG (2022) who presented with maroon colored stools and admitted for acute on chronic blood loss anemia on hospital day 0  Acute on chronic blood loss anemia Patient has a history of GI bleeding in the past due to peptic ulcer disease and Dieulafoy  lesion. She was hospitalized in November after being found to have a drop in her Hb, however, she had no evidence of GI bleeding at that time. Today, patient endorses 4 days of maroon colored stools and lightheadedness. Hb on admission 5.7, down from baseline of 7-8.  - 2u PRBCs ordered by ED - Follow up post-transfusion H&H - GI consulted, plan for small bowel enteroscopy tomorrow  - IV Protonix 40 mg bid - Holding aspirin  - If large volume hematochezia/hemodynamic instability, check a STAT CT angio (history of diverticulosis)  Thrombocytopenia Baseline platelet count around 90-110, but have dropped to 80 today. No indication for transfusion.   ESRD on HD MWF Last dialysis session was yesterday. Nephrology consulted.   Chronic HFpEF Aortic Stenosis s/p TAVR Hypertension Patient had a TAVR in November of 2024 and was advised to take aspirin 81 mg daily. She has been adhering to this, however, she stopped taking aspirin when she started noticing signs of bleeding. For her blood pressure, she takes coreg 25 mg bid, imdur 60 mg daily, nifedipine 60 mg bid, and olmesartan 40 mg daily. - Resumed home coreg, imdur, and nifedipine - Ordered  irbesartan 300 mg daily (hospital equivalent for olmesartan)  - Holding aspirin  COPD On 2-3 L [place] at home. Ordered Webb Silversmith (hospital formularly for home [place]).   Psoriatic Arthritis Resumed home prednisone 5 mg daily.   Diet: NPO VTE: SCDs IVF: None Code: Full  Prior to Admission Living Arrangement: Home, living daughter Anticipated Discharge Location: Home Barriers to Discharge: medical stability  Dispo: Admit patient to Observation with expected length of stay less than 2 midnights.  Signed: Chauncey Mann DO Internal Medicine Resident PGY-3  01/27/2023, 10:23 AM

## 2023-01-27 NOTE — Plan of Care (Signed)

## 2023-01-27 NOTE — ED Notes (Signed)
Pt placed on 3LT FIO2 Sats 93

## 2023-01-27 NOTE — ED Notes (Signed)
Report given to Victorino Dike RN at [facility] ED

## 2023-01-27 NOTE — Consult Note (Signed)
[facility] Gastroenterology Consult  Referring Provider: No ref. provider found Primary Care Physician:  Malka So., MD Primary Gastroenterologist: [facility]   Reason for Consultation: hematochezia, anemia  SUBJECTIVE:   HPI: Jodi Bell is a 62 y.o. female with past medical history significant for end stage renal disease on hemodialysis Mon/Wed/Fri, aortic stenosis status post transcatheter aortic valve replacement on 12/23/2022, hypertension, diabetes mellitus, COPD. Presented to hospital on 01/27/23 with chief complaint of hematochezia. She noted relatively painless hematochezia beginning on 12/13-12/14. Episodes began as dark red/burgundy in color and became bright red, now returned to burgundy. Her most recent BM was in ED today. She noted the stool itself appears dark brown in color, almost black. She denied NSAID use. No anticoagulant use. She noted being instructed to start every other day baby aspirin after TAVR, though she noted having history of GI bleeding in past with aspirin use and did not start therapy. Currently no ASA use. Has shortness of breath with exertion, no chest pain. No fevers or chills. Had nausea, no emesis. No know family history colon cancer.   Small bowel enteroscopy 07/22/2021 (Dr. Marca Ancona) for abnormal video capsule endoscopy showed white plaque in entire esophagus, regular Z line at 35 cm, endoclip in gastric fundus, no specific pathology in entire examined duodenum, no significant pathology in jejunum to 160 cm.   Video capsule endoscopy 07/19/2021 (Dr. Ewing Schlein) for anemia showed mild gastritis, mid small bowel fresh blood and second area of bleeding in distal small bowel with old blood in colon.   EGD/Colonoscopy 10/02/20 (Dr. Dulce Sellar) for hematochezia, melena and acute post hemorrhagic anemia showed normal appearing esophagus, 3 old hemoclips, examined stomach normal, duodenum normal, moderate ulceration/erythema over internal hemorrhoids, many small and large mouthed  diverticula found in sigmoid colon, descending colon, transverse colon and ascending colon, no fresh or old blood noted during examination.  EGD 09/22/20 (Dr. Dulce Sellar) for hematochezia/melena/acute post hemorrhagic anemia showed findings suspicious for dieulafoy lesions in stomach roughly 1 cm distal to GE junction s/p hemoclip placement x 1, in gastric fundus s/p hemoclip placement x 1, in gastric cardia s/p hemoclip placement x 1.   Past Medical History:  Diagnosis Date   Anemia of chronic disease    Anxiety    Arthritis    oa and psoriatic ra   Asthma    Back pain, chronic    lower back   Chronic insomnia    COPD (chronic obstructive pulmonary disease) (HCC)    Diabetes mellitus    type 2   ESRD on dialysis [facility])    Essential hypertension    GERD (gastroesophageal reflux disease)    Gout    History of hiatal hernia    Hypoalbuminemia    Hyponatremia    Mild aortic stenosis    Mitral regurgitation    Mitral stenosis    Obesity    PAD (peripheral artery disease) (HCC)    a. externial iliac calcification seen on CT 04/2020.   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 Lynnette Caffey & Laneta Simmers.   Sleep apnea    Tobacco abuse    Upper GI bleed 05/2019   Past Surgical History:  Procedure Laterality Date   BACK SURGERY  2016   lower back fusion with cage   BIOPSY  09/23/2017   Procedure: BIOPSY;  Surgeon: Graylin Shiver, MD;  Location: [facility] ENDOSCOPY;  Service: Endoscopy;;   BIOPSY  05/19/2019  Procedure: BIOPSY;  Surgeon: Kathi Der, MD;  Location: [facility] ENDOSCOPY;  Service: Gastroenterology;;   CESAREAN SECTION  1990   x 1    COLONOSCOPY WITH PROPOFOL N/A 09/23/2017   Procedure: COLONOSCOPY WITH PROPOFOL Hemostatic clips placed;  Surgeon: Graylin Shiver, MD;  Location: [facility] ENDOSCOPY;  Service: Endoscopy;  Laterality: N/A;   COLONOSCOPY WITH PROPOFOL N/A 10/02/2020   Procedure: COLONOSCOPY WITH PROPOFOL;   Surgeon: Willis Modena, MD;  Location: [facility] ENDOSCOPY;  Service: Endoscopy;  Laterality: N/A;   CORONARY ARTERY BYPASS GRAFT N/A 09/13/2020   Procedure: CORONARY ARTERY BYPASS GRAFTING (CABG), ON PUMP, TIMES TWO, USING LEFT INTERNAL MAMMARY ARTERY AND RIGHT ENDOSCOPICALLY HARVESTED GREATER SAPHENOUS VEIN;  Surgeon: Alleen Borne, MD;  Location: [facility] OR;  Service: Open Heart Surgery;  Laterality: N/A;   DILATION AND CURETTAGE OF UTERUS  1988   ENTEROSCOPY N/A 07/22/2021   Procedure: ENTEROSCOPY;  Surgeon: Kerin Salen, MD;  Location: [facility] ENDOSCOPY;  Service: Gastroenterology;  Laterality: N/A;   ESOPHAGOGASTRODUODENOSCOPY N/A 09/22/2020   Procedure: ESOPHAGOGASTRODUODENOSCOPY (EGD);  Surgeon: Willis Modena, MD;  Location: [facility] ENDOSCOPY;  Service: Endoscopy;  Laterality: N/A;   ESOPHAGOGASTRODUODENOSCOPY (EGD) WITH PROPOFOL N/A 09/23/2017   Procedure: ESOPHAGOGASTRODUODENOSCOPY (EGD) WITH PROPOFOL;  Surgeon: Graylin Shiver, MD;  [facility]: [facility];  Service: Endoscopy;  Laterality: N/A;   ESOPHAGOGASTRODUODENOSCOPY (EGD) WITH PROPOFOL N/A 05/19/2019   Procedure: ESOPHAGOGASTRODUODENOSCOPY (EGD) WITH PROPOFOL;  Surgeon: Kathi Der, MD;  [facility];  Service: Gastroenterology;  Laterality: N/A;   ESOPHAGOGASTRODUODENOSCOPY (EGD) WITH PROPOFOL N/A 10/02/2020   Procedure: ESOPHAGOGASTRODUODENOSCOPY (EGD) WITH PROPOFOL;  Surgeon: Willis Modena, MD;  Location: [facility] ENDOSCOPY;  Service: Endoscopy;  Laterality: N/A;   GIVENS CAPSULE STUDY N/A 05/19/2019   Procedure: GIVENS CAPSULE STUDY;  Surgeon: Kathi Der, MD;  [facility] ENDOSCOPY;  Service: Gastroenterology;  Laterality: N/A;   GIVENS CAPSULE STUDY N/A 07/19/2021   Procedure: GIVENS CAPSULE STUDY;  Surgeon: [name], MD;  Location: [facility] ENDOSCOPY;  Service: Gastroenterology;  Laterality: N/A;   HEMOSTASIS CLIP PLACEMENT  09/22/2020   Procedure: HEMOSTASIS CLIP PLACEMENT;  Surgeon: Willis Modena, MD;  [facility] ENDOS[facility];   Service: Endoscopy;;   HEMOSTASIS CONTROL  09/22/2020   Procedure: HEMOSTASIS CONTROL;  Surgeon: Willis Modena, MD;  Location: [facility] ENDOSCOPY;  Service: Endoscopy;;   HOT HEMOSTASIS N/A 05/19/2019   Procedure: HOT HEMOSTASIS (ARGON PLASMA COAGULATION/BICAP);  Surgeon: Kathi Der, MD;  Location: [facility] ENDOSCOPY;  Service: Gastroenterology;  Laterality: N/A;   INTRAOPERATIVE TRANSTHORACIC ECHOCARDIOGRAM N/A 12/23/2022   Procedure: INTRAOPERATIVE TRANSTHORACIC ECHOCARDIOGRAM;  Surgeon: [name], MD;  [facility] INVASIVE CV LAB;  Service: Cardiovascular;  Laterality: N/A;   LEFT HEART CATH AND CORONARY ANGIOGRAPHY N/A 09/10/2020   Procedure: LEFT HEART CATH AND CORONARY ANGIOGRAPHY;  Surgeon: Yvonne Kendall, MD;  [facility] INVASIVE CV LAB;  Service: Cardiovascular;  Laterality: N/A;   PARTIAL KNEE ARTHROPLASTY Left 11/12/2017   Procedure: left unicompartmental arthroplasty-medial;  Surgeon: Durene Romans, MD;  Location: [facility] ORS;  Service: Orthopedics;  Laterality: Left;    RIGHT HEART CATH AND CORONARY/GRAFT ANGIOGRAPHY N/A 06/26/2022   Procedure: RIGHT HEART CATH AND CORONARY/GRAFT ANGIOGRAPHY;  Surgeon: [name], MD;  [facility] INVAS[facility] CV LAB;  Service: Cardiovascular;  Laterality: N/A;   SUBMUCOSAL INJECTION  09/23/2017   Procedure: SUBMUCOSAL INJECTION;  Surgeon: Graylin Shiver, MD;  Location: [facility] ENDOSCOPY;  Service: Endoscopy;;  in colon   TEE WITHOUT CARDIOVERSION N/A 09/13/2020   Procedure: TRANSESOPHAGEAL ECHOCARDIOGRAM (TEE);  Surgeon: Alleen Borne, MD;  Location: [facility] OR;  Service: Open Heart Surgery;  Laterality: N/A;   TUBAL LIGATION  1990   Prior to Admission medications   Medication Sig Start Date End Date Taking? Authorizing Provider  acetaminophen (TYLENOL) 500 MG tablet Take 1,000 mg by mouth daily as needed (pain).   Yes [provider]  albuterol (PROVENTIL) (2.5 MG/3ML) 0.083% nebulizer solution Take 3 mLs (2.5 mg total) by nebulization every  6 (six) hours as needed for wheezing or shortness of breath. 07/10/21  Yes Hunsucker, Lesia Sago, MD  allopurinol (ZYLOPRIM) 100 MG tablet Take 100 mg by mouth every evening.   Yes [provider]  Artificial Tear Solution (TEARS NATURALE OP) Place 1 drop into both eyes as needed (dry eye).   Yes [provider]  aspirin 81 MG chewable tablet Chew 1 tablet (81 mg total) by mouth every other day. 01/07/23  Yes Monna Fam, MD  busPIRone (BUSPAR) 5 MG tablet Take 5 mg by mouth 3 (three) times daily. 01/01/23 01/01/24 Yes [provider]  carvedilol (COREG) 25 MG tablet Take 1 tablet (25 mg total) by mouth 2 (two) times daily with a meal. 12/25/22  Yes Janetta Hora, PA-C  cetirizine (ZYRTEC) 10 MG tablet Take 10 mg by mouth daily as needed for allergies.   Yes [provider]  Cholecalciferol (VITAMIN D) 50 MCG (2000 UT) tablet Take 2,000 Units by mouth daily.   Yes [provider]  cinacalcet (SENSIPAR) 30 MG tablet Take 60 mg by mouth every Monday, Wednesday, and Friday with hemodialysis. Pt states she takes 2 tab on Mon, 3 tab on Wed, and 2 tab on Fri.   Yes [provider]  cloNIDine (CATAPRES - DOSED IN MG/24 HR) 0.3 mg/24hr patch Place 0.3 mg onto the skin every Sunday.   Yes [provider]  conjugated estrogens (PREMARIN) vaginal cream Place vaginally daily as needed. 01/07/23  Yes Monna Fam, MD  diclofenac Sodium (VOLTAREN) 1 % GEL Apply 2 g topically daily as needed (pain).   Yes [provider]  docusate sodium (COLACE) 100 MG capsule Take 100 mg by mouth daily as needed for moderate constipation.   Yes [provider]  EPINEPHrine 0.3 mg/0.3 mL IJ SOAJ injection Inject into the muscle as needed for anaphylaxis. 07/12/21  Yes [provider]  fluticasone (FLONASE) 50 MCG/ACT nasal spray Place 1 spray into both nostrils daily as needed for allergies or rhinitis.   Yes [provider]   hydrocortisone 2.5%-nystatin-zinc oxide 20% 1:1:1 ointment mixture Apply topically daily as needed. 01/07/23  Yes Monna Fam, MD  isosorbide mononitrate (IMDUR) 60 MG 24 hr tablet Take 1 tablet (60 mg total) by mouth daily. 10/27/22  Yes Jake Bathe, MD  LORazepam (ATIVAN) 0.5 MG tablet Take 0.5 mg by mouth every 8 (eight) hours as needed for anxiety.   Yes [provider]  montelukast (SINGULAIR) 10 MG tablet Take 10 mg by mouth daily with supper.    Yes [provider]  multivitamin (RENA-VIT) TABS tablet Take 1 tablet by mouth every Monday, Wednesday, and Friday.   Yes [provider]  NIFEdipine (PROCARDIA XL/NIFEDICAL XL) 60 MG 24 hr tablet Take 60 mg by mouth 2 (two) times daily. 10/01/21  Yes [provider]  olmesartan (BENICAR) 40 MG tablet Take 40 mg by mouth every evening. 05/06/19  Yes [provider]  ondansetron (ZOFRAN) 4 MG tablet Take 4 mg by mouth 2 (two) times daily as needed for nausea or  vomiting.   Yes [provider]  pantoprazole (PROTONIX) 40 MG tablet Take 40 mg by mouth 2 (two) times daily. 06/09/20  Yes [provider]  prednisoLONE acetate (PRED FORTE) 1 % ophthalmic suspension Place 1 drop into both eyes every 4 (four) hours. 12/31/22  Yes [provider]  predniSONE (DELTASONE) 5 MG tablet Take 1 tablet (5 mg total) by mouth daily with breakfast. 02/27/22  Yes Marrianne Mood, MD  Probiotic Product (PROBIOTIC PO) Take 1 capsule by mouth daily.   Yes [provider]  silver sulfADIAZINE (SILVADENE) 1 % cream Apply 1 Application topically daily as needed (wound care).   Yes [provider]  Tiotropium Bromide-Olodaterol (STIOLTO RESPIMAT) 2.5-2.5 MCG/ACT AERS Inhale 2 puffs once daily into the lungs. 11/28/21  Yes Hunsucker, Lesia Sago, MD  VENTOLIN HFA 108 (90 Base) MCG/ACT inhaler Inhale 2 puffs into the lungs See admin instructions. Take 2 puffs every 4-6 hours as needed for  wheezing and shortness of breath 04/08/22  Yes Hunsucker, Lesia Sago, MD  zolpidem (AMBIEN) 5 MG tablet Take 5 mg by mouth at bedtime as needed for sleep. 02/04/21  Yes [provider]  neomycin-polymyxin b-dexamethasone (MAXITROL) 3.5-10000-0.1 SUSP Place 1 drop into both eyes in the morning, at noon, in the evening, and at bedtime. Patient not taking: Reported on 01/27/2023 12/18/22   [provider]  OXYGEN Inhale 2-3 L into the lungs continuous.    [provider]   Current Facility-Administered Medications  Medication Dose Route Frequency Provider Last Rate Last Admin   acetaminophen (TYLENOL) tablet 650 mg  650 mg Oral Q6H PRN Atway, Rayann N, DO       Or   acetaminophen (TYLENOL) suppository 650 mg  650 mg Rectal Q6H PRN Atway, Rayann N, DO       albuterol (PROVENTIL) (2.5 MG/3ML) 0.083% nebulizer solution 2.5 mg  2.5 mg Inhalation Q4H PRN Atway, Rayann N, DO       allopurinol (ZYLOPRIM) tablet 100 mg  100 mg Oral QHS Atway, Rayann N, DO       busPIRone (BUSPAR) tablet 5 mg  5 mg Oral TID Atway, Rayann N, DO   5 mg at 01/27/23 1143   [START ON 01/28/2023] cinacalcet (SENSIPAR) tablet 60 mg  60 mg Oral Q M,W,F-HD Atway, Rayann N, DO       irbesartan (AVAPRO) tablet 300 mg  300 mg Oral QHS Atway, Rayann N, DO       isosorbide mononitrate (IMDUR) 24 hr tablet 60 mg  60 mg Oral Daily Atway, Rayann N, DO   60 mg at 01/27/23 1142   NIFEdipine (PROCARDIA XL/NIFEDICAL XL) 24 hr tablet 60 mg  60 mg Oral BID Atway, Rayann N, DO   60 mg at 01/27/23 1144   pantoprazole (PROTONIX) injection 40 mg  40 mg Intravenous Q12H Atway, Rayann N, DO       [START ON 01/28/2023] predniSONE (DELTASONE) tablet 5 mg  5 mg Oral Q breakfast Atway, Rayann N, DO       umeclidinium-vilanterol (ANORO ELLIPTA) 62.5-25 MCG/ACT 1 puff  1 puff Inhalation Daily Atway, Rayann N, DO       zolpidem (AMBIEN) tablet 5 mg  5 mg Oral QHS PRN Atway, Rayann N, DO       Allergies as of 01/27/2023 - Review  Complete 01/27/2023  Allergen Reaction Noted   3-methyl-2-benzothiazolinone hydrazone Other (See Comments) 09/07/2012   Banana Anaphylaxis, Swelling, and Other (See Comments) 01/21/2018   Black walnut flavoring  agent (non-screening) Anaphylaxis and Itching 05/18/2019   Hazelnut (filbert) Anaphylaxis 05/18/2019   Leflunomide and related Other (See Comments) 07/23/2012   Lisinopril Swelling 01/05/2014   No healthtouch food allergies Anaphylaxis 05/18/2019   Other Anaphylaxis, Swelling, and Other (See Comments) 09/07/2012   Pecan nut (diagnostic) Anaphylaxis and Itching 05/18/2019   Trazodone and nefazodone Other (See Comments) 07/23/2014   Adalimumab Other (See Comments) 07/23/2012   Escitalopram oxalate Other (See Comments) 09/29/2013   Ezetimibe Other (See Comments) 01/13/2018   Secukinumab Swelling 05/18/2019   Statins Other (See Comments) 09/04/2014   Ferrlecit [na ferric gluc cplx in sucrose]  09/19/2020   Gabapentin Other (See Comments) 06/26/2021   Nsaids  06/24/2022   Family History  Problem Relation Age of Onset   Lung cancer Mother    Diabetes Sister    Heart disease Sister    Asthma Daughter    Arthritis Daughter    Arthritis Daughter    Thyroid cancer Other        1/2 sister   Heart disease Other        1/2 sister   Social History   Socioeconomic History   Marital status: Divorced    Spouse name: BASIL   Number of children: 3   Years of education: 1   Highest education level: GED or equivalent  Occupational History   Occupation: retired Public house manager  Tobacco Use   Smoking status: Former    Current packs/day: 0.00    Average packs/day: 0.5 packs/day for 40.0 years (20.0 ttl pk-yrs)    Types: Cigarettes    Start date: 08/1980    Quit date: 08/2020    Years since quitting: 2.4   Smokeless tobacco: Never  Vaping Use   Vaping status: Never Used  Substance and Sexual Activity   Alcohol use: Yes    Comment: occ   Drug use: No   Sexual activity: Not on file  Other  Topics Concern   Not on file  Social History Narrative   Not on file   Social Drivers of Health   Financial Resource Strain: Low Risk  (06/12/2022)   Received from [facility]   Overall Financial Resource Strain (CARDIA)    Difficulty of Paying Living Expenses: Not hard at all  Recent Concern: Financial Resource Strain - Medium Risk (04/29/2022)   Received from [facility]   Overall Financial Resource Strain (CARDIA)    Difficulty of Paying Living Expenses: Somewhat hard  Food Insecurity: No Food Insecurity (01/02/2023)   Hunger Vital Sign    Worried About Running Out of Food in the Last Year: Never true    Ran Out of Food in the Last Year: Never true  Transportation Needs: No Transportation Needs (01/02/2023)   PRAPARE - Administrator, Civil Service (Medical): No    Lack of Transportation (Non-Medical): No  Physical Activity: Inactive (09/15/2018)   Exercise Vital Sign    Days of Exercise per Week: 0 days    Minutes of Exercise per Session: 0 min  Stress: Stress Concern Present (07/02/2022)   Received from [facility], [facility] - Occupational Stress Questionnaire    Feeling of Stress : To some extent  Social Connections: Unknown (06/23/2021)   Received from [facility], [facility]   Social Network    Social Network: Not on file  Intimate Partner Violence: Not At Risk (01/02/2023)   Humiliation, Afraid, Rape, and Kick questionnaire    Fear  of Current or Ex-Partner: No    Emotionally Abused: No    Physically Abused: No    Sexually Abused: No   Review of Systems:  Review of Systems  Constitutional:  Negative for chills and fever.  Respiratory:  Positive for shortness of breath.   Cardiovascular:  Negative for chest pain.  Gastrointestinal:  Positive for abdominal pain (mild periumbilical), blood in stool, melena and nausea. Negative for vomiting.    OBJECTIVE:   Temp:  [97.6 F (36.4  C)-98.6 F (37 C)] 97.6 F (36.4 C) (12/17 0947) Pulse Rate:  [64-70] 64 (12/17 1213) Resp:  [14-16] 16 (12/17 1136) BP: (125-196)/(51-90) 191/90 (12/17 1213) SpO2:  [98 %-100 %] 100 % (12/17 1213) Last BM Date : 01/27/23 Physical Exam Constitutional:      General: She is not in acute distress.    Appearance: She is not ill-appearing, toxic-appearing or diaphoretic.  Cardiovascular:     Rate and Rhythm: Normal rate and regular rhythm.     Heart sounds: Murmur heard.  Pulmonary:     Effort: No respiratory distress.     Breath sounds: Normal breath sounds.  Abdominal:     General: Bowel sounds are normal. There is no distension.     Palpations: Abdomen is soft.     Tenderness: There is no abdominal tenderness. There is no guarding.  Musculoskeletal:     Right lower leg: No edema.     Left lower leg: No edema.  Skin:    General: Skin is warm and dry.  Neurological:     Mental Status: She is alert.     Labs: Recent Labs    01/27/23 0535  WBC 5.5  HGB 5.7*  HCT 19.1*  PLT 80*   BMET Recent Labs    01/27/23 0535  NA 136  K 4.0  CL 105  CO2 25  GLUCOSE 147*  BUN 32*  CREATININE 4.06*  CALCIUM 8.4*   LFT No results for input(s): "PROT", "ALBUMIN", "AST", "ALT", "ALKPHOS", "BILITOT", "BILIDIR", "IBILI" in the last 72 hours. PT/INR No results for input(s): "LABPROT", "INR" in the last 72 hours.  Diagnostic imaging: No results found.  IMPRESSION: Acute post hemorrhagic anemia Hematochezia Melena History Dieulafoy lesion x 3 in 2022 End stage renal disease on hemodialysis Mon/Wed/Fri Aortic stenosis status post TAVR in November 2024 COPD  PLAN: -Recommend small bowel enteroscopy to further evaluate melena, hematochezia and anemia -Discussed procedure with patient including benefits, alternatives, risks of bleeding/infection/perforation/missed lesion/anesthesia, she verbalized understanding and elected to proceed -Ok for diet today, NPO at midnight for  small bowel enteroscopy tomorrow afternoon with Dr. Ewing Schlein, hopefully hemodialysis can be completed tomorrow AM before endoscopy -IV PPI Q12Hr -Trend H/H, transfuse for Hgb < 7  -Further recommendations to follow pending procedure -If large volume hematochezia and/or hypotension were to occur, would check STAT CT angiogram given patient history of diverticulosis   LOS: 0 days   Liliane Shi, DO [facility] Gastroenterology

## 2023-01-27 NOTE — Progress Notes (Signed)
   The night team got paged by nurse Lucious Groves due to  patient's concern for right thigh swelling. Patient was seen at bedside around 2040 and exam showed normal thigh skin with no apparent changes,no surrounding erythema and no signs of compartment syndrome and both thigh looked symmetric. She denied any associated  pain. Patient is reassured and adviced to inform her nurse if anything changes . Left  Thigh   Right Thigh   Left and right thigh    Signature: Kathleen Lime , MD Internal Medicine Resident, PGY-1 [facility]  Pager: [phone] 9:00 PM, 01/27/2023   Please contact the on call pager after 5 pm and on weekends at [phone].

## 2023-01-27 NOTE — ED Triage Notes (Addendum)
Patient presents via EMS from home with c/o bloody stools x 3 days. Patient also reports intermittent abd cramps, however denies pain at present. Denies n/v. Hx same  Recent admission to [facility]. Dialysis M-W-F

## 2023-01-28 ENCOUNTER — Observation Stay (HOSPITAL_COMMUNITY): Payer: Medicare PPO | Admitting: Registered Nurse

## 2023-01-28 ENCOUNTER — Ambulatory Visit: Payer: Medicare PPO

## 2023-01-28 ENCOUNTER — Other Ambulatory Visit (HOSPITAL_COMMUNITY): Payer: Medicare PPO

## 2023-01-28 ENCOUNTER — Encounter (HOSPITAL_COMMUNITY)
Admission: EM | Disposition: A | Discharge status: Home / Self Care | Payer: Self-pay | Source: Home / Self Care | Attending: Internal Medicine

## 2023-01-28 DIAGNOSIS — J9611 Chronic respiratory failure with hypoxia: Secondary | ICD-10-CM | POA: Diagnosis present

## 2023-01-28 DIAGNOSIS — K31811 Angiodysplasia of stomach and duodenum with bleeding: Secondary | ICD-10-CM | POA: Diagnosis present

## 2023-01-28 DIAGNOSIS — M898X9 Other specified disorders of bone, unspecified site: Secondary | ICD-10-CM | POA: Diagnosis present

## 2023-01-28 DIAGNOSIS — M109 Gout, unspecified: Secondary | ICD-10-CM | POA: Diagnosis present

## 2023-01-28 DIAGNOSIS — Z8711 Personal history of peptic ulcer disease: Secondary | ICD-10-CM | POA: Diagnosis not present

## 2023-01-28 DIAGNOSIS — Z79899 Other long term (current) drug therapy: Secondary | ICD-10-CM | POA: Diagnosis not present

## 2023-01-28 DIAGNOSIS — Z833 Family history of diabetes mellitus: Secondary | ICD-10-CM | POA: Diagnosis not present

## 2023-01-28 DIAGNOSIS — K625 Hemorrhage of anus and rectum: Secondary | ICD-10-CM | POA: Diagnosis present

## 2023-01-28 DIAGNOSIS — D649 Anemia, unspecified: Secondary | ICD-10-CM | POA: Diagnosis not present

## 2023-01-28 DIAGNOSIS — Z9981 Dependence on supplemental oxygen: Secondary | ICD-10-CM | POA: Diagnosis not present

## 2023-01-28 DIAGNOSIS — D62 Acute posthemorrhagic anemia: Secondary | ICD-10-CM | POA: Diagnosis present

## 2023-01-28 DIAGNOSIS — D631 Anemia in chronic kidney disease: Secondary | ICD-10-CM | POA: Diagnosis present

## 2023-01-28 DIAGNOSIS — L4050 Arthropathic psoriasis, unspecified: Secondary | ICD-10-CM | POA: Diagnosis present

## 2023-01-28 DIAGNOSIS — D696 Thrombocytopenia, unspecified: Secondary | ICD-10-CM | POA: Diagnosis present

## 2023-01-28 DIAGNOSIS — E1122 Type 2 diabetes mellitus with diabetic chronic kidney disease: Secondary | ICD-10-CM | POA: Diagnosis present

## 2023-01-28 DIAGNOSIS — I5032 Chronic diastolic (congestive) heart failure: Secondary | ICD-10-CM | POA: Diagnosis present

## 2023-01-28 DIAGNOSIS — I132 Hypertensive heart and chronic kidney disease with heart failure and with stage 5 chronic kidney disease, or end stage renal disease: Secondary | ICD-10-CM | POA: Diagnosis present

## 2023-01-28 DIAGNOSIS — J4489 Other specified chronic obstructive pulmonary disease: Secondary | ICD-10-CM | POA: Diagnosis present

## 2023-01-28 DIAGNOSIS — I2510 Atherosclerotic heart disease of native coronary artery without angina pectoris: Secondary | ICD-10-CM | POA: Diagnosis present

## 2023-01-28 DIAGNOSIS — Z952 Presence of prosthetic heart valve: Secondary | ICD-10-CM | POA: Diagnosis not present

## 2023-01-28 DIAGNOSIS — Z992 Dependence on renal dialysis: Secondary | ICD-10-CM | POA: Diagnosis not present

## 2023-01-28 DIAGNOSIS — N186 End stage renal disease: Secondary | ICD-10-CM | POA: Diagnosis present

## 2023-01-28 DIAGNOSIS — K219 Gastro-esophageal reflux disease without esophagitis: Secondary | ICD-10-CM | POA: Diagnosis present

## 2023-01-28 DIAGNOSIS — E1151 Type 2 diabetes mellitus with diabetic peripheral angiopathy without gangrene: Secondary | ICD-10-CM | POA: Diagnosis present

## 2023-01-28 DIAGNOSIS — Z801 Family history of malignant neoplasm of trachea, bronchus and lung: Secondary | ICD-10-CM | POA: Diagnosis not present

## 2023-01-28 DIAGNOSIS — K449 Diaphragmatic hernia without obstruction or gangrene: Secondary | ICD-10-CM | POA: Diagnosis present

## 2023-01-28 HISTORY — PX: HOT HEMOSTASIS: SHX5433

## 2023-01-28 HISTORY — PX: ENTEROSCOPY: SHX5533

## 2023-01-28 LAB — RENAL FUNCTION PANEL
Albumin: 2.8 g/dL — ABNORMAL LOW (ref 3.5–5.0)
Anion gap: 11 (ref 5–15)
BUN: 45 mg/dL — ABNORMAL HIGH (ref 8–23)
CO2: 23 mmol/L (ref 22–32)
Calcium: 8.6 mg/dL — ABNORMAL LOW (ref 8.9–10.3)
Chloride: 102 mmol/L (ref 98–111)
Creatinine, Ser: 5.31 mg/dL — ABNORMAL HIGH (ref 0.44–1.00)
GFR, Estimated: 9 mL/min — ABNORMAL LOW (ref >60)
Glucose, Bld: 93 mg/dL (ref 70–99)
Phosphorus: 4.1 mg/dL (ref 2.5–4.6)
Potassium: 4.3 mmol/L (ref 3.5–5.1)
Sodium: 136 mmol/L (ref 135–145)

## 2023-01-28 LAB — TYPE AND SCREEN
ABO/RH(D): AB POS
Antibody Screen: NEGATIVE
Unit division: 0
Unit division: 0

## 2023-01-28 LAB — BPAM RBC
Blood Product Expiration Date: 202412232359
Blood Product Expiration Date: 202501242359
ISSUE DATE / TIME: 202412170922
ISSUE DATE / TIME: 202412171716
Unit Type and Rh: 6200
Unit Type and Rh: 8400

## 2023-01-28 LAB — CBC
HCT: 26.1 % — ABNORMAL LOW (ref 36.0–46.0)
HCT: 26 % — ABNORMAL LOW (ref 36.0–46.0)
Hemoglobin: 8.2 g/dL — ABNORMAL LOW (ref 12.0–15.0)
Hemoglobin: 8 g/dL — ABNORMAL LOW (ref 12.0–15.0)
MCH: 31.8 pg (ref 26.0–34.0)
MCH: 31.1 pg (ref 26.0–34.0)
MCHC: 31.4 g/dL (ref 30.0–36.0)
MCHC: 30.8 g/dL (ref 30.0–36.0)
MCV: 101.2 fL — ABNORMAL HIGH (ref 80.0–100.0)
MCV: 101.2 fL — ABNORMAL HIGH (ref 80.0–100.0)
Platelets: 106 10*3/uL — ABNORMAL LOW (ref 150–400)
Platelets: 108 10*3/uL — ABNORMAL LOW (ref 150–400)
RBC: 2.58 MIL/uL — ABNORMAL LOW (ref 3.87–5.11)
RBC: 2.57 MIL/uL — ABNORMAL LOW (ref 3.87–5.11)
RDW: 21.3 % — ABNORMAL HIGH (ref 11.5–15.5)
RDW: 21.6 % — ABNORMAL HIGH (ref 11.5–15.5)
WBC: 8.2 10*3/uL (ref 4.0–10.5)
WBC: 7.3 10*3/uL (ref 4.0–10.5)
nRBC: 0.2 % (ref 0.0–0.2)
nRBC: 0.3 % — ABNORMAL HIGH (ref 0.0–0.2)

## 2023-01-28 LAB — HEPATITIS B SURFACE ANTIGEN: Hepatitis B Surface Ag: NONREACTIVE

## 2023-01-28 LAB — PROTIME-INR
INR: 1.2 (ref 0.8–1.2)
Prothrombin Time: 15.6 s — ABNORMAL HIGH (ref 11.4–15.2)

## 2023-01-28 SURGERY — ENTEROSCOPY
Anesthesia: Monitor Anesthesia Care

## 2023-01-28 MED ORDER — PENTAFLUOROPROP-TETRAFLUOROETH EX AERO: 1.0000 | INHALATION_SPRAY | CUTANEOUS | Status: DC | PRN

## 2023-01-28 MED ORDER — CHLORHEXIDINE GLUCONATE CLOTH 2 % EX PADS
6.0000 | MEDICATED_PAD | Freq: Every day | CUTANEOUS | Status: DC
Start: 2023-01-28 — End: 2023-01-29
  Administered 2023-01-28 – 2023-01-29 (×2): 6 via TOPICAL

## 2023-01-28 MED ORDER — GLYCOPYRROLATE PF 0.2 MG/ML IJ SOSY
PREFILLED_SYRINGE | INTRAMUSCULAR | Status: DC | PRN
  Administered 2023-01-28: .1 mg via INTRAVENOUS

## 2023-01-28 MED ORDER — NEPRO/CARBSTEADY PO LIQD: 237.0000 mL | ORAL | Status: DC | PRN

## 2023-01-28 MED ORDER — HEPARIN SODIUM (PORCINE) 1000 UNIT/ML DIALYSIS: 1000.0000 [IU] | INTRAMUSCULAR | Status: DC | PRN

## 2023-01-28 MED ORDER — SODIUM CHLORIDE 0.9 % IV SOLN
INTRAVENOUS | Status: AC | PRN
  Administered 2023-01-28: 250 mL via INTRAMUSCULAR

## 2023-01-28 MED ORDER — ALTEPLASE 2 MG IJ SOLR
2.0000 mg | Freq: Once | INTRAMUSCULAR | Status: DC | PRN
Start: 2023-01-28 — End: 2023-01-29

## 2023-01-28 MED ORDER — LIDOCAINE 2% (20 MG/ML) 5 ML SYRINGE
INTRAMUSCULAR | Status: DC | PRN
  Administered 2023-01-28: 60 mg via INTRAVENOUS

## 2023-01-28 MED ORDER — ANTICOAGULANT SODIUM CITRATE 4% (200MG/5ML) IV SOLN: 5.0000 mL | Status: DC | PRN

## 2023-01-28 MED ORDER — PROPOFOL 10 MG/ML IV BOLUS
INTRAVENOUS | Status: DC | PRN
  Administered 2023-01-28: 125 ug/kg/min via INTRAVENOUS
  Administered 2023-01-28: 50 mg via INTRAVENOUS
  Administered 2023-01-28: 30 mg via INTRAVENOUS

## 2023-01-28 MED ORDER — LIDOCAINE-PRILOCAINE 2.5-2.5 % EX CREA: 1.0000 | TOPICAL_CREAM | CUTANEOUS | Status: DC | PRN

## 2023-01-28 MED ORDER — LIDOCAINE HCL (PF) 1 % IJ SOLN: 5.0000 mL | INTRAMUSCULAR | Status: DC | PRN

## 2023-01-28 NOTE — Transfer of Care (Signed)
Immediate Anesthesia Transfer of Care Note  Patient: Jodi Bell  Procedure(s) Performed: ENTEROSCOPY HOT HEMOSTASIS (ARGON PLASMA COAGULATION/BICAP)  Patient Location: PACU  Anesthesia Type:MAC  Level of Consciousness: awake, alert , and oriented  Airway & Oxygen Therapy: Patient Spontanous Breathing and Patient connected to nasal cannula oxygen  Post-op Assessment: Report given to RN and Post -op Vital signs reviewed and stable  Post vital signs: Reviewed and stable  Last Vitals:  Vitals Value Taken Time  BP 133/66 01/28/23 1402  Temp 36.9 C 01/28/23 1402  Pulse 89 01/28/23 1403  Resp 22 01/28/23 1403  SpO2 92 % 01/28/23 1403  Vitals shown include unfiled device data.  Last Pain:  Vitals:   01/28/23 1402  TempSrc: Temporal  PainSc:          Complications: There were no known notable events for this encounter.

## 2023-01-28 NOTE — Progress Notes (Signed)
Mobility Specialist Progress Note:    01/28/23 0909  Mobility  Activity Transferred to/from [facility]  Level of Assistance Standby assist, set-up cues, supervision of patient - no hands on  Assistive Device BSC  Distance Ambulated (ft) 2 ft  Activity Response Tolerated well  Mobility Referral Yes  Mobility visit 1 Mobility  Mobility Specialist Start Time (ACUTE ONLY) 0855  Mobility Specialist Stop Time (ACUTE ONLY) 0900  Mobility Specialist Time Calculation (min) (ACUTE ONLY) 5 min   Pt received in bed, declined ambulation in room d/t weakness. Agreeable to transfer to/from [facility]. Tolerated well, VSS throughout. Left pt in room with all needs met, call bell in reach.    Feliciana Rossetti Mobility Specialist Please contact via Special educational needs teacher or  Rehab office at [phone]

## 2023-01-28 NOTE — Anesthesia Postprocedure Evaluation (Signed)
Anesthesia Post Note  Patient: Jodi Bell  Procedure(s) Performed: ENTEROSCOPY HOT HEMOSTASIS (ARGON PLASMA COAGULATION/BICAP)     Patient location during evaluation: PACU Anesthesia Type: MAC Level of consciousness: awake and alert Pain management: pain level controlled Vital Signs Assessment: post-procedure vital signs reviewed and stable Respiratory status: spontaneous breathing, nonlabored ventilation, respiratory function stable and patient connected to nasal cannula oxygen Cardiovascular status: stable and blood pressure returned to baseline Postop Assessment: no apparent nausea or vomiting Anesthetic complications: no   There were no known notable events for this encounter.  Last Vitals:  Vitals:   01/28/23 1410 01/28/23 1421  BP: 134/62 (!) 145/68  Pulse: 82 84  Resp: (!) 21 (!) 25  Temp:    SpO2: 96% 95%    Last Pain:  Vitals:   01/28/23 1421  TempSrc:   PainSc: 0-No pain                 [name]

## 2023-01-28 NOTE — Progress Notes (Signed)
[facility] Progress Note  Subjective: seen in room, no c/o's. Going for endoscopy today.   Vitals:   01/28/23 1402 01/28/23 1410 01/28/23 1421 01/28/23 1625  BP: 133/66 134/62 (!) 145/68 (!) 157/73  Pulse: 88 82 84 76  Resp: (!) 22 (!) 21 (!) 25 18  Temp: 98.4 F (36.9 C)     TempSrc: Temporal     SpO2: 92% 96% 95% 100%  Weight:      Height:        Exam: Gen alert, no distress, 2 L [place] O2  alert, nad   no jvd  Chest cta bilat  Cor reg no RG  Abd soft ntnd no ascites   Ext no LE edema   Alert, NF, Ox 3    LUA AVF+bruit           Renal-related home meds: - coreg 25 bid, clonidine patch 0.3, olmesartan 40, procardia xl 60 bid - home O2 2-3 L - prednisone 5 qd - imdur 60 qd    OP HD: MWF [facility] on [address] From nov 2024 --> 3.5h  58.5kg,  300/600  Hep none  L AVF     Assessment/ Plan: Acute blood loss anemia - hx of GIB, maroon stools recently. Hb 5.7, b/l 7- 8. GI consulted, 2u prbc's ordered and PPI IV bid started. Per pmd / GI.  ESKD - on HD MWF. Last HD Monday. HD today.  HTN - bp's good, taking multiple HTN meds. Continue.   Volume - euvolemic on exam. Get wt's. UF 2-2.5 L w/ HD today.  Anemia of eskd+ GI bleed- Hb 5.7 on admission, up to 8.2 today after prbcs'. Get esa records.  MBD ckd - CCa and phos are in range. Follow.  COPD - 2-3 L home O2 H/o TAVR         Vinson Moselle MD  [facility] 01/28/2023, 6:45 PM  Recent Labs  Lab 01/27/23 0535 01/27/23 2248 01/28/23 0449 01/28/23 1000  HGB 5.7*   < > 8.2* 8.0*  ALBUMIN  --   --  2.8*  --   CALCIUM 8.4*  --  8.6*  --   PHOS  --   --  4.1  --   CREATININE 4.06*  --  5.31*  --   K 4.0  --  4.3  --    < > = values in this interval not displayed.   No results for input(s): "IRON", "TIBC", "FERRITIN" in the last 168 hours. Inpatient medications:  allopurinol  100 mg Oral QHS   busPIRone  5 mg Oral TID   Chlorhexidine Gluconate Cloth  6 each Topical Q0600   cinacalcet  60 mg  Oral Q M,W,F-HD   irbesartan  300 mg Oral QHS   isosorbide mononitrate  60 mg Oral Daily   NIFEdipine  60 mg Oral BID   pantoprazole (PROTONIX) IV  40 mg Intravenous Q12H   predniSONE  5 mg Oral Q breakfast   umeclidinium-vilanterol  1 puff Inhalation Daily    anticoagulant sodium citrate     acetaminophen **OR** acetaminophen, albuterol, alteplase, anticoagulant sodium citrate, feeding supplement (NEPRO CARB STEADY), heparin, lidocaine (PF), lidocaine-prilocaine, pentafluoroprop-tetrafluoroeth, zolpidem

## 2023-01-28 NOTE — Progress Notes (Signed)
   Night team got paged by the nurse around 3:40 AM to report that Ms Hamp had a bloody bowel movement.  Ms Lansang is 62 year old woman who presented on 01/27/2023 due to maroon stools ,found to have Hgb of 5.7 and admitted for GI bleed, s/p 2 units of blood transfusions with post HnH 7.3, pending enteroscopy.  Patient was seen at bedside around 3:46 AM. Patient was laying in bed sleeping ,in no acute distress. She responded to her name and responded  appropriately to questions. Denied any dizziness or chest pain. Her BP was 164/67, afebrile and sating 100% on 2L [place] with a pulse of 71. Unremarkable lung and cardiac exam. Abdomen is soft ,had normal bowel sounds and non tender to palpation. Large volume hematochezia noted in her toilet bowel. Rectal exam deferred for patient's comfort . Patient remains hemodynamically stable at this time.  - Stat CBC and RFP - Consider STAT CT angiogram per GI       Signature: Kathleen Lime , MD Internal Medicine Resident, PGY-1 [facility]  Pager: [phone] 3:54 AM, 01/28/2023   Please contact the on call pager after 5 pm and on weekends at [phone].

## 2023-01-28 NOTE — Plan of Care (Signed)

## 2023-01-28 NOTE — Progress Notes (Signed)
Jodi Bell 1:21 PM  Subjective: Patient seen and examined and discussed yesterday with Dr. Lorenso Quarry and she had not seen any blood or had a bowel movement today but last night's was reviewed in the resident's note and other than a little periumbilical discomfort she has no other complaints and she stopped her aspirin earlier in the week and is not on any blood thinners or nonsteroidals and has no other complaints  Objective: Vital signs stable afebrile no acute distress exam please see preassessment evaluation increased BUN and creatinine hemoglobin stable with transfusion  Assessment: GI blood loss in patient with history of AVMs  Plan: Okay to proceed with enteroscope today with anesthesia assistance  [facility]  office [phone] After 5PM or if no answer call [phone]

## 2023-01-28 NOTE — Anesthesia Preprocedure Evaluation (Addendum)
Anesthesia Evaluation  Patient identified by MRN, date of birth, ID band Patient awake    Reviewed: Allergy & Precautions, NPO status , Patient's Chart, lab work & pertinent test results, reviewed documented beta blocker date and time   Airway Mallampati: III  TM Distance: >3 FB     Dental  (+) Edentulous Upper, Edentulous Lower   Pulmonary asthma , sleep apnea , COPD, former smoker    + decreased breath sounds      Cardiovascular hypertension, + angina  + CAD, + Peripheral Vascular Disease and +CHF  + dysrhythmias + Valvular Problems/Murmurs (s/p TAVR) AS  Rhythm:Regular Rate:Normal     Neuro/Psych   Anxiety        GI/Hepatic hiatal hernia,GERD  ,,  Endo/Other  diabetes, Type 2    Renal/GU ESRF and DialysisRenal disease     Musculoskeletal  (+) Arthritis ,    Abdominal   Peds  Hematology  (+) Blood dyscrasia, anemia   Anesthesia Other Findings   Reproductive/Obstetrics                             Anesthesia Physical Anesthesia Plan  ASA: 3  Anesthesia Plan: MAC   Post-op Pain Management:    Induction:   PONV Risk Score and Plan: 2 and Ondansetron  Airway Management Planned: Natural Airway and Nasal Cannula  Additional Equipment:   Intra-op Plan:   Post-operative Plan:   Informed Consent: I have reviewed the patients History and Physical, chart, labs and discussed the procedure including the risks, benefits and alternatives for the proposed anesthesia with the patient or authorized representative who has indicated his/her understanding and acceptance.     Dental advisory given  Plan Discussed with: CRNA  Anesthesia Plan Comments:        Anesthesia Quick Evaluation

## 2023-01-28 NOTE — Op Note (Signed)
[facility] Patient Name: Jodi Bell Procedure Date : 01/28/2023 MRN: [id number] Attending MD: [name] , MD, [id number] Date of Birth: [birth year] CSN: [id number] Age: 62 Admit Type: Inpatient Procedure:                Small bowel enteroscopy Indications:              Melena Providers:                [name], MD, Glory Rosebush, RN, Kandice Robinsons,                            Technician Referring MD:              Medicines:                Monitored Anesthesia Care Complications:            No immediate complications. Estimated Blood Loss:     Estimated blood loss: none. Procedure:                Pre-Anesthesia Assessment:                           - Prior to the procedure, a History and Physical                            was performed, and patient medications and                            allergies were reviewed. The patient's tolerance of                            previous anesthesia was also reviewed. The risks                            and benefits of the procedure and the sedation                            options and risks were discussed with the patient.                            All questions were answered, and informed consent                            was obtained. Prior Anticoagulants: The patient has                            taken no anticoagulant or antiplatelet agents                            except for aspirin. ASA Grade Assessment: III - A                            patient with severe systemic disease. After                            reviewing  the risks and benefits, the patient was                            deemed in satisfactory condition to undergo the                            procedure.                           After obtaining informed consent, the endoscope was                            passed under direct vision. Throughout the                            procedure, the patient's blood pressure, pulse, and                             oxygen saturations were monitored continuously. The                            [id number]-Q180 ([id number]) Olympus enteroscope was                            introduced through the mouth and advanced to the                            distal jejunum. The small bowel enteroscopy was                            accomplished without difficulty. The patient                            tolerated the procedure well. Scope In: Scope Out: Findings:      A Tiny hiatal hernia was present.      An endoclip was found in the gastric fundus.      Two small angiodysplastic lesions with very minimal bleeding were found       in the gastric fundus. Fulguration to stop the bleeding by argon plasma       at 1 liter/minute and 20 watts was successful.      There was no evidence of significant pathology in the entire examined       duodenum.      There was no evidence of significant pathology in the entire jejunum       with exam to the distal jejunum or possibly even proximal ileum and exam       to the entire length of the scope. Impression:               - Tiny hiatal hernia.                           - An endoclip was found in the stomach.                           - Two very minimal bleeding angiodysplastic lesions  in the stomach. Treated with argon plasma                            coagulation (APC).                           - Normal examined duodenum.                           - The examined portion of the jejunum and possibly                            proximal ileum was normal.                           - No specimens collected. Recommendation:           - Soft diet today.                           - Continue present medications.                           - Return to GI clinic PRN.                           - Telephone GI clinic if symptomatic PRN. Procedure Code(s):        --- Professional ---                           [phone], Small intestinal endoscopy, enteroscopy                             beyond second portion of duodenum, not including                            ileum; with control of bleeding (eg, injection,                            bipolar cautery, unipolar cautery, laser, heater                            probe, stapler, plasma coagulator) Diagnosis Code(s):        --- Professional ---                           K44.9, Diaphragmatic hernia without obstruction or                            gangrene                           T18.2XXA, Foreign body in stomach, initial encounter                           K31.811, Angiodysplasia of stomach and duodenum  with bleeding                           K92.1, Melena (includes Hematochezia) CPT copyright 2022 American Medical Association. All rights reserved. The codes documented in this report are preliminary and upon coder review may  be revised to meet current compliance requirements. [name], MD 01/28/2023 2:05:34 PM This report has been signed electronically. Number of Addenda: 0

## 2023-01-28 NOTE — Progress Notes (Signed)
Pt receives out-pt HD at [facility] in [facility] on MWF. Will assist as needed.   Olivia Canter Renal Navigator [phone]

## 2023-01-28 NOTE — Care Management Obs Status (Signed)
MEDICARE OBSERVATION STATUS NOTIFICATION   Patient Details  Name: Jodi Bell MRN: [id number] Date of Birth: Jan 09, 1961   Medicare Observation Status Notification Given:  Yes    Lawerance Sabal, RN 01/28/2023, 8:03 AM

## 2023-01-28 NOTE — Progress Notes (Signed)
Subjective: Jodi Bell is a 62 y.o. person living with a history of diabetes, ESRD on HD MWF, COPD on 3 L [place], aortic stenosis s/p TAVR (November 2024), and CAD s/p CABG (2022) who presented with maroon colored stools and admitted for acute on chronic blood loss anemia on hospital day 1.   Today, she reports feeling better. Her breathing has improved since receiving the blood transfusion. Her abdominal pain has also resolved. She reports numerous bowel movents overnight containing blood.   Objective:  Vital signs in last 24 hours: Vitals:   01/28/23 0048 01/28/23 0346 01/28/23 0559 01/28/23 0801  BP: (!) 190/66 (!) 164/67 (!) 174/68 (!) 169/61  Pulse: 65 71 76 70  Resp: 18  17 17   Temp: 98.4 F (36.9 C) 98.5 F (36.9 C) 97.9 F (36.6 C)   TempSrc: Oral Oral Oral   SpO2: 100% 100% 100% 100%  Height:       Physical Exam: General:NAD, well appearing  Cardiac:regular rate and rhythm, LUSB systolic murmur  Pulmonary:clear to auscultate, on 3L [place]  Abdominal:soft, non-tender, bowel sounds present  Neuro:alert, awake, participating in conversation  WGN:FAOZH nonpitting edema present bilaterally  Skin:warm and dry  Psych:  normal mood and affect      Latest Ref Rng & Units 01/28/2023    4:49 AM 01/27/2023   10:48 PM 01/27/2023    5:35 AM  CBC  WBC 4.0 - 10.5 K/uL 8.2   5.5   Hemoglobin 12.0 - 15.0 g/dL 8.2  7.3  5.7   Hematocrit 36.0 - 46.0 % 26.1  24.0  19.1   Platelets 150 - 400 K/uL 106   80        Latest Ref Rng & Units 01/28/2023    4:49 AM 01/27/2023    5:35 AM 01/07/2023    4:25 AM  BMP  Glucose 70 - 99 mg/dL 93  086  92   BUN 8 - 23 mg/dL 45  32  11   Creatinine 0.44 - 1.00 mg/dL 5.78  4.69  6.29   Sodium 135 - 145 mmol/L 136  136  133   Potassium 3.5 - 5.1 mmol/L 4.3  4.0  4.3   Chloride 98 - 111 mmol/L 102  105  97   CO2 22 - 32 mmol/L 23  25  30    Calcium 8.9 - 10.3 mg/dL 8.6  8.4  9.5      Assessment/Plan:  Principal Problem:   Symptomatic  anemia Active Problems:   Diabetes mellitus with coincident hypertension (HCC)   Essential hypertension   Psoriasis arthropathica (HCC)   COPD (chronic obstructive pulmonary disease) (HCC)   Acute anemia   GI bleeding   Coronary artery disease involving coronary bypass graft of native heart with angina pectoris (HCC)   Thrombocytopenia (HCC)  Acute on chronic blood loss anemia Patient has a history of GI bleeding in the past due to peptic ulcer disease and Dieulafoy lesion. She was hospitalized in November after being found to have a drop in her Hb, however, she had no evidence of GI bleeding at that time. Hb on admission 5.7, down from baseline of 7-8. Patient reports bloody BMs overnight but she has remained hemodynamically stable. 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  -GI consulted, plan for enteroscopy today -Transfuse if Hgb is less than 7 -IV PPI BID -Hold ASA -IF patient becomes hemodynamically unstable, will order  STAT CTA -Follow up on GI recs after scoping today   Thrombocytopenia  Appears chronic, per chart review patient has been thrombocytopenic since June -Improved to 106 today, no emergent need for transfusion  -Possibly due to HD   ESRD on HD MWF  Nephology consulted -HD today  Chronic HFpEF Aortic Stenosis s/p TAVR 12/2022 Hypertension Patient had a TAVR in November of 2024 and was advised to take aspirin 81 mg daily. She has been adhering to this, however, she stopped taking aspirin when she started noticing signs of bleeding. - Resumed home coreg, imdur, and nifedipine - Ordered irbesartan 300 mg daily (hospital equivalent for olmesartan)  - Holding aspirin  COPD On 2-3 L [place] at home. -Webb Silversmith (hospital formularly for home Stiolto).    Psoriatic Arthritis Resumed home prednisone 5 mg daily.   DM Does not appear to take medications at home for DM,  last A1c was 5.8 on 11/12/2022 (less accurate in ESRD on HD) -Will start CBG every morning  -Will hold off on SSI unless necessary  -Currently NPO   Anticipated Discharge Location:Home  Barriers to Discharge:Medical Treatment  Dispo: Anticipated discharge in approximately 2 day(s).   [name], DO 01/28/2023, 10:11 AM After 5pm on weekdays and 1pm on weekends: On Call pager [phone]

## 2023-01-29 DIAGNOSIS — D649 Anemia, unspecified: Secondary | ICD-10-CM | POA: Diagnosis not present

## 2023-01-29 LAB — RENAL FUNCTION PANEL
Albumin: 2.7 g/dL — ABNORMAL LOW (ref 3.5–5.0)
Anion gap: 7 (ref 5–15)
BUN: 21 mg/dL (ref 8–23)
CO2: 27 mmol/L (ref 22–32)
Calcium: 8 mg/dL — ABNORMAL LOW (ref 8.9–10.3)
Chloride: 101 mmol/L (ref 98–111)
Creatinine, Ser: 3.54 mg/dL — ABNORMAL HIGH (ref 0.44–1.00)
GFR, Estimated: 14 mL/min — ABNORMAL LOW (ref >60)
Glucose, Bld: 107 mg/dL — ABNORMAL HIGH (ref 70–99)
Phosphorus: 3.6 mg/dL (ref 2.5–4.6)
Potassium: 3.9 mmol/L (ref 3.5–5.1)
Sodium: 135 mmol/L (ref 135–145)

## 2023-01-29 LAB — CBC
HCT: 24.6 % — ABNORMAL LOW (ref 36.0–46.0)
Hemoglobin: 7.6 g/dL — ABNORMAL LOW (ref 12.0–15.0)
MCH: 31 pg (ref 26.0–34.0)
MCHC: 30.9 g/dL (ref 30.0–36.0)
MCV: 100.4 fL — ABNORMAL HIGH (ref 80.0–100.0)
Platelets: 101 10*3/uL — ABNORMAL LOW (ref 150–400)
RBC: 2.45 MIL/uL — ABNORMAL LOW (ref 3.87–5.11)
RDW: 20.5 % — ABNORMAL HIGH (ref 11.5–15.5)
WBC: 4.7 10*3/uL (ref 4.0–10.5)
nRBC: 0 % (ref 0.0–0.2)

## 2023-01-29 LAB — HEPATITIS B SURFACE ANTIBODY, QUANTITATIVE: Hep B S AB Quant (Post): 10.5 m[IU]/mL

## 2023-01-29 NOTE — Discharge Instructions (Signed)
You came to the hospital for maroon colored stools and you were diagnosed with GI bleed from AVMs .  We treated you with endoscopic procedures.    *For your GI bleeding -Please continue taking Protonix (pantoprazole) twice a day   -Please hold your aspirin, you may start the aspirin medication again on Monday 02/02/2023  -Please see your PCP in 7 to 10 days -Please follow up with [facility] gastroenterology   Continue taking your home medications as previously prescribed, unless indicated above.   Follow-up appointments: Please visit your family doctor in 7 to 10 days Please visit Gastroenterologist  (stomach doctor) in 7-10 days  If you have any questions or concerns please feel free to call: Internal medicine clinic at [phone]   If you have any of these following symptoms, please call us or seek care at an emergency department: -Chest Pain -Difficulty Breathing -bright red blood in your stool or dark black stool -Worsening abdominal  pain -Syncope (passing out) -Drooping of face -Slurred speech -Sudden weakness in your leg or arm -Fever -Chills   We are glad that you are feeling better, it was a pleasure to care for you!  [name] DO

## 2023-01-29 NOTE — Progress Notes (Signed)
Received patient in bed to unit.  Alert and oriented.  Informed consent signed and in chart.   TX duration: 3:00  Patient tolerated well.  Transported back to the room  Alert, without acute distress.  Hand-off given to patient's nurse.   Access used: LUE AVF Access issues: None  Total UF removed: 2700 mL Medication(s) given: None Post HD VS: please see data insert    01/29/23 0114  Vitals  Temp 98.3 F (36.8 C)  Temp Source Oral  BP (!) 189/75  MAP (mmHg) 106  BP Location Right Arm  BP Method Automatic  Patient Position (if appropriate) Lying  Pulse Rate 74  Pulse Rate Source Monitor  ECG Heart Rate 74  Resp (!) 22  Oxygen Therapy  SpO2 100 %  O2 Device Nasal Cannula  O2 Flow Rate (L/min) 2 L/min  Patient Activity (if Appropriate) In bed  Pulse Oximetry Type Continuous  Post Treatment  Dialyzer Clearance Lightly streaked  Hemodialysis Intake (mL) 0 mL  Liters Processed 72  Fluid Removed (mL) 2700 mL  Tolerated HD Treatment Yes  Post-Hemodialysis Comments Treatment completed and blood returned without issue; UF challenge successful.  AVG/AVF Arterial Site Held (minutes) 10 minutes (Gauze applied and secured with paper tape; hemostasis achieved.)  AVG/AVF Venous Site Held (minutes) 10 minutes (Gauze applied and secured with paper tape; hemostasis achieved.)  Note  Patient Observations Patient alert, no c/o voiced, no acute distress noted; patient stable for return transport.  Fistula / Graft Left Upper arm  Placement Date/Time: 05/17/18 0800   Orientation: Left  Access Location: Upper arm  Site Condition No complications  Fistula / Graft Assessment Present;Thrill;Bruit  Status Flushed;Patent;Deaccessed  Drainage Description None      [facility]

## 2023-01-29 NOTE — Hospital Course (Addendum)
Acute on chronic blood loss anemia Patient has a history of GI bleeding in the past due to peptic ulcer disease and Dieulafoy lesion. She was hospitalized in November after being found to have a drop in her Hb, however, she had no evidence of GI bleeding at that time. On admission, patient endorses 4 days of maroon colored stools and lightheadedness. Hb on admission 5.7, down from baseline of 7-8. Patient received 2 units of blood during this hospitalization. Endoscopy revealed two gastric angiodysplasias which were treated with cautery. Her hemoglobin was 7.6 on day of discharge and she was hemodynamically stable. Patient was instructed to hold aspirin until 02/02/2023 ( 5 days) and to follow up with PCP and GI.   Thrombocytopenia Baseline platelet count around 90-110, platelets on day of discharge were 101.    ESRD on HD MWF Nephrology was consulted and she received inpatient dialysis while hospitalized.    Chronic HFpEF Aortic Stenosis s/p TAVR Hypertension Patient had a TAVR in November of 2024 and was advised to take aspirin 81 mg daily. She has been adhering to this, however, she stopped taking aspirin when she started noticing signs of bleeding. For her blood pressure, she takes coreg 25 mg bid, imdur 60 mg daily, nifedipine 60 mg bid, and olmesartan 40 mg daily. Patient remained on blood pressure regimen (except for olmesartan which, she was on hospital equivalent of irbesartan 300 mg daily).     COPD On 2-3 L [place] at home, stable during hospitalization.    Psoriatic Arthritis Continued on home prednisone 5 mg daily.   DM Patient did not require SSI during hospitalization.

## 2023-01-29 NOTE — Progress Notes (Signed)
Mobility Specialist Progress Note:   01/29/23 1022  Mobility  Activity Ambulated with assistance in room;Transferred from bed to chair  Level of Assistance Standby assist, set-up cues, supervision of patient - no hands on  Assistive Device Front wheel walker  Distance Ambulated (ft) 35 ft  Activity Response Tolerated well  Mobility Referral Yes  Mobility visit 1 Mobility  Mobility Specialist Start Time (ACUTE ONLY) 1010  Mobility Specialist Stop Time (ACUTE ONLY) 1017  Mobility Specialist Time Calculation (min) (ACUTE ONLY) 7 min   Pt received dangling EOB, agreeable to ambulate in room and transfer to chair. Tolerated well, asx throughout. SpO2 99% on 3L throughout session. Pt sitting comfortably in chair with all needs met.  Feliciana Rossetti Mobility Specialist Please contact via Special educational needs teacher or  Rehab office at [phone]

## 2023-01-29 NOTE — Progress Notes (Signed)
Contacted by attending this morning regarding pt's d/c today. Contacted [facility] staff to be made aware of pt's d/c today and that pt should resume care tomorrow. Clinic has access to [facility] epic and can obtain needed clinicals.   Olivia Canter Renal Navigator [phone]

## 2023-01-29 NOTE — Discharge Summary (Signed)
Name: Jodi Bell MRN: [id number] DOB: [birth year] 62 y.o. PCP: Genelle Gather, MD  Date of Admission: 01/27/2023  5:17 AM Date of Discharge: 01/29/2023 2:08 PM Attending Physician: Dr. Antony Contras  Discharge Diagnosis: Principal Problem:   Symptomatic anemia Active Problems:   Diabetes mellitus with coincident hypertension (HCC)   Essential hypertension   Psoriasis arthropathica (HCC)   COPD (chronic obstructive pulmonary disease) (HCC)   GI bleeding   Coronary artery disease involving coronary bypass graft of native heart with angina pectoris (HCC)   Thrombocytopenia (HCC)    Discharge Medications: Allergies as of 01/29/2023       Reactions   3-methyl-2-benzothiazolinone Hydrazone Other (See Comments)   Muscle weakness muscle cramping in legs Other reaction(s): Myalgias (Muscle Pain)   Banana Anaphylaxis, Swelling, Other (See Comments)   TONGUE AND MOUTH SWELLING   Black Buyer, retail (non-screening) Anaphylaxis, Itching   Walnuts   Hazelnut (filbert) Anaphylaxis   Hazelnuts   Leflunomide And Related Other (See Comments)   Severe Headache   Lisinopril Swelling   Angioedema  Swelling of the face   No [employer] Food Allergies Anaphylaxis   Spicy Mustard 4.7.2021 Pt reports that she eats Regular Yellow Mustard Swelling and itching of tongue   Other Anaphylaxis, Swelling, Other (See Comments)   Cosentyx= angioedema Mouth itches and tongue swelling Hazel nuts and pecans also Walnuts Renal failure Serotonin syndrome  Tremors Muscle weakness muscle cramping in legs Other reaction(s): Myalgias (Muscle Pain)   Pecan Nut (diagnostic) Anaphylaxis, Itching   Pecans   Trazodone And Nefazodone Other (See Comments)   Serotonin Syndrome  Tremors   Adalimumab Other (See Comments)   Blisters on abdomen =humira   Escitalopram Oxalate Other (See Comments)   Hand problems - Serotonin Syndrome    Ezetimibe Other (See Comments)   myalgias cramps    Secukinumab Swelling   Cosentyx = angiodema   Statins Other (See Comments)   Leg pains and weakness   Ferrlecit [na Ferric Gluc Cplx In Sucrose]    Not reaction given from HD   Gabapentin Other (See Comments)   tremors   Nsaids    Avoid due to renal problems         Medication List     PAUSE taking these medications    aspirin 81 MG chewable tablet Wait to take this until: February 02, 2023 Chew 1 tablet (81 mg total) by mouth every other day.       TAKE these medications    acetaminophen 500 MG tablet Commonly known as: TYLENOL Take 1,000 mg by mouth daily as needed (pain).   albuterol (2.5 MG/3ML) 0.083% nebulizer solution Commonly known as: PROVENTIL Take 3 mLs (2.5 mg total) by nebulization every 6 (six) hours as needed for wheezing or shortness of breath.   Ventolin HFA 108 (90 Base) MCG/ACT inhaler Generic drug: albuterol Inhale 2 puffs into the lungs See admin instructions. Take 2 puffs every 4-6 hours as needed for wheezing and shortness of breath   allopurinol 100 MG tablet Commonly known as: ZYLOPRIM Take 100 mg by mouth every evening.   busPIRone 5 MG tablet Commonly known as: BUSPAR Take 5 mg by mouth 3 (three) times daily.   carvedilol 25 MG tablet Commonly known as: COREG Take 1 tablet (25 mg total) by mouth 2 (two) times daily with a meal.   cetirizine 10 MG tablet Commonly known as: ZYRTEC Take 10 mg by mouth daily as needed for allergies.   cinacalcet 30 MG  tablet Commonly known as: SENSIPAR Take 60 mg by mouth every Monday, Wednesday, and Friday with hemodialysis. Pt states she takes 2 tab on Mon, 3 tab on Wed, and 2 tab on Fri.   cloNIDine 0.3 mg/24hr patch Commonly known as: CATAPRES - Dosed in mg/24 hr Place 0.3 mg onto the skin every Sunday.   conjugated estrogens vaginal cream Commonly known as: PREMARIN Place vaginally daily as needed.   diclofenac Sodium 1 % Gel Commonly known as: VOLTAREN Apply 2 g topically daily as  needed (pain).   docusate sodium 100 MG capsule Commonly known as: COLACE Take 100 mg by mouth daily as needed for moderate constipation.   EPINEPHrine 0.3 mg/0.3 mL Soaj injection Commonly known as: EPI-PEN Inject into the muscle as needed for anaphylaxis.   fluticasone 50 MCG/ACT nasal spray Commonly known as: FLONASE Place 1 spray into both nostrils daily as needed for allergies or rhinitis.   hydrocortisone 2.5%-nystatin-zinc oxide 20% 1:1:1 ointment mixture Apply topically daily as needed.   isosorbide mononitrate 60 MG 24 hr tablet Commonly known as: IMDUR Take 1 tablet (60 mg total) by mouth daily.   LORazepam 0.5 MG tablet Commonly known as: ATIVAN Take 0.5 mg by mouth every 8 (eight) hours as needed for anxiety.   montelukast 10 MG tablet Commonly known as: SINGULAIR Take 10 mg by mouth daily with supper.   multivitamin Tabs tablet Take 1 tablet by mouth every Monday, Wednesday, and Friday.   neomycin-polymyxin b-dexamethasone 3.5-10000-0.1 Susp Commonly known as: MAXITROL Place 1 drop into both eyes in the morning, at noon, in the evening, and at bedtime.   NIFEdipine 60 MG 24 hr tablet Commonly known as: PROCARDIA XL/NIFEDICAL XL Take 60 mg by mouth 2 (two) times daily.   olmesartan 40 MG tablet Commonly known as: BENICAR Take 40 mg by mouth every evening.   ondansetron 4 MG tablet Commonly known as: ZOFRAN Take 4 mg by mouth 2 (two) times daily as needed for nausea or vomiting.   OXYGEN Inhale 2-3 L into the lungs continuous.   pantoprazole 40 MG tablet Commonly known as: PROTONIX Take 40 mg by mouth 2 (two) times daily.   prednisoLONE acetate 1 % ophthalmic suspension Commonly known as: PRED FORTE Place 1 drop into both eyes every 4 (four) hours.   predniSONE 5 MG tablet Commonly known as: DELTASONE Take 1 tablet (5 mg total) by mouth daily with breakfast.   PROBIOTIC PO Take 1 capsule by mouth daily.   silver sulfADIAZINE 1 %  cream Commonly known as: SILVADENE Apply 1 Application topically daily as needed (wound care).   Stiolto Respimat 2.5-2.5 MCG/ACT Aers Generic drug: Tiotropium Bromide-Olodaterol Inhale 2 puffs once daily into the lungs.   TEARS NATURALE OP Place 1 drop into both eyes as needed (dry eye).   Vitamin D 50 MCG (2000 UT) tablet Take 2,000 Units by mouth daily.   zolpidem 5 MG tablet Commonly known as: AMBIEN Take 5 mg by mouth at bedtime as needed for sleep.        Disposition and follow-up:   Ms.Murphy T Rochette was discharged from [facility] in Good condition.  At the hospital follow up visit please address:  1.  Follow-up:  *Upper GI Bleed 2/2 gastric angiodysplastic lesions  -Please ensure patient follows up with GI -Please ensure patient remains on PPI twice a day  -Assess for anemia symptoms and signs of bleeding  -Please note that patient will restart aspirin on 02/02/2023   *  ESRD -Please ensure patient attends outpatient HD -She received on session of HD while hospitalized   2.  Labs / imaging needed at time of follow-up: CBC, BMP   3.  Pending labs/ test needing follow-up: N/A  4.  Medication Changes  STOPPED  -None   ADDED  -None   MODIFIED  -HOLD aspirin until 02/02/2023    Follow-up Appointments:  Follow-up Information     Genelle Gather, MD Follow up.   Specialty: Internal Medicine Contact information: [address] [place] [address] [phone]         [name], MD Follow up.   Specialty: Gastroenterology Contact information: [address] [place] [address] [phone]                 Hospital Course by problem list: Acute on chronic blood loss anemia Patient has a history of GI bleeding in the past due to peptic ulcer disease and Dieulafoy lesion. She was hospitalized in November after being found to have a drop in her Hb, however, she had no evidence of GI bleeding at that  time. On admission, patient endorses 4 days of maroon colored stools and lightheadedness. Hb on admission 5.7, down from baseline of 7-8. Patient received 2 units of blood during this hospitalization. Endoscopy revealed two gastric angiodysplasias which were treated with cautery. Her hemoglobin was 7.6 on day of discharge and she was hemodynamically stable. Patient was instructed to hold aspirin until 02/02/2023 ( 5 days) and to follow up with PCP and GI.   Thrombocytopenia Baseline platelet count around 90-110, platelets on day of discharge were 101.    ESRD on HD MWF Nephrology was consulted and she received inpatient dialysis while hospitalized.    Chronic HFpEF Aortic Stenosis s/p TAVR Hypertension Patient had a TAVR in November of 2024 and was advised to take aspirin 81 mg daily. She has been adhering to this, however, she stopped taking aspirin when she started noticing signs of bleeding. For her blood pressure, she takes coreg 25 mg bid, imdur 60 mg daily, nifedipine 60 mg bid, and olmesartan 40 mg daily. Patient remained on blood pressure regimen (except for olmesartan which, she was on hospital equivalent of irbesartan 300 mg daily).     COPD On 2-3 L [place] at home, stable during hospitalization.    Psoriatic Arthritis Continued on home prednisone 5 mg daily.   DM Patient did not require SSI during hospitalization.    Discharge Subjective: Patient reported feeling well except for a headache and slight abdominal pain. She denied BM since the endoscopy procedure yesterday. She is ready for discharge and understood the plan to hold off taking aspirin until 02/02/2023.   Discharge Exam:   Blood pressure (!) 156/57, pulse 73, temperature 99.5 F (37.5 C), temperature source Oral, resp. rate 16, height 5' 2.75" (1.594 m), weight 64 kg, SpO2 100%.  Constitutional:Well appearing, sitting in a chair, in no acute distress Cardiovascular: regular rate and rhythm, systolic murmur ausculted at  LUSB Pulmonary/Chest: normal work of breathing on home supplemental oxygen, lungs clear to auscultation bilaterally. No crackles  Abdominal: soft, slight umbilicus tenderness, non-distended.  Neurological: alert & oriented  MSK: no gross abnormalities. No pitting edema Skin: warm and dry Psych: Normal mood and affect  Pertinent Labs, Studies, and Procedures:     Latest Ref Rng & Units 01/29/2023    7:16 AM 01/28/2023   10:00 AM 01/28/2023    4:49 AM  CBC  WBC 4.0 -  10.5 K/uL 4.7  7.3  8.2   Hemoglobin 12.0 - 15.0 g/dL 7.6  8.0  8.2   Hematocrit 36.0 - 46.0 % 24.6  26.0  26.1   Platelets 150 - 400 K/uL 101  108  106        Latest Ref Rng & Units 01/29/2023    7:16 AM 01/28/2023    4:49 AM 01/27/2023    5:35 AM  CMP  Glucose 70 - 99 mg/dL 469  93  629   BUN 8 - 23 mg/dL 21  45  32   Creatinine 0.44 - 1.00 mg/dL 5.28  4.13  2.44   Sodium 135 - 145 mmol/L 135  136  136   Potassium 3.5 - 5.1 mmol/L 3.9  4.3  4.0   Chloride 98 - 111 mmol/L 101  102  105   CO2 22 - 32 mmol/L 27  23  25    Calcium 8.9 - 10.3 mg/dL 8.0  8.6  8.4     No results found.   Discharge Instructions: Discharge Instructions     Call MD for:  difficulty breathing, headache or visual disturbances   Complete by: As directed    Call MD for:  extreme fatigue   Complete by: As directed    Call MD for:  hives   Complete by: As directed    Call MD for:  persistant dizziness or light-headedness   Complete by: As directed    Call MD for:  persistant nausea and vomiting   Complete by: As directed    Call MD for:  redness, tenderness, or signs of infection (pain, swelling, redness, odor or green/yellow discharge around incision site)   Complete by: As directed    Call MD for:  severe uncontrolled pain   Complete by: As directed    Call MD for:  temperature >100.4   Complete by: As directed    Diet - low sodium heart healthy   Complete by: As directed    Discharge instructions   Complete by: As directed     You came to the hospital for maroon colored stools and you were diagnosed with GI bleed from AVMs .  We treated you with endoscopic procedures.    *For your GI bleeding -Please continue taking Protonix (pantoprazole) twice a day   -Please hold your aspirin, you may start the aspirin medication again on Monday 02/02/2023  -Please see your PCP in 7 to 10 days -Please follow up with [facility] gastroenterology   Continue taking your home medications as previously prescribed, unless indicated above.   Follow-up appointments: Please visit your family doctor in 7 to 10 days Please visit Gastroenterologist  (stomach doctor) in 7-10 days  If you have any questions or concerns please feel free to call: Internal medicine clinic at [phone]   If you have any of these following symptoms, please call us or seek care at an emergency department: -Chest Pain -Difficulty Breathing -bright red blood in your stool or dark black stool -Worsening abdominal  pain -Syncope (passing out) -Drooping of face -Slurred speech -Sudden weakness in your leg or arm -Fever -Chills   We are glad that you are feeling better, it was a pleasure to care for you!  [name] DO   Increase activity slowly   Complete by: As directed        Signed: [name] DO [facility] - PGY1 Pager: [phone] 01/29/2023, 2:08 PM    Please contact the on  call pager after 5 pm and on weekends at [phone].

## 2023-01-29 NOTE — Progress Notes (Signed)
Pt is refusing blood sugar checks

## 2023-01-29 NOTE — Progress Notes (Signed)
Patient discharged to home, AVS reviewed. Patient verbalized understanding, including holding her aspirin until 02/02/23. Patient would like to schedule her own doctors appointments, GI and PCP contact info added to AVS.   Patient is working to arrange transportation home.   IV removed, site is clean dry and intact.

## 2023-01-30 ENCOUNTER — Encounter (HOSPITAL_COMMUNITY): Payer: Self-pay | Admitting: Gastroenterology

## 2023-02-02 ENCOUNTER — Ambulatory Visit (INDEPENDENT_AMBULATORY_CARE_PROVIDER_SITE_OTHER): Payer: Medicare PPO | Admitting: Cardiology

## 2023-02-02 ENCOUNTER — Other Ambulatory Visit: Payer: Self-pay | Admitting: Cardiology

## 2023-02-02 ENCOUNTER — Ambulatory Visit: Payer: Medicare PPO | Attending: Cardiology

## 2023-02-02 DIAGNOSIS — I10 Essential (primary) hypertension: Secondary | ICD-10-CM | POA: Insufficient documentation

## 2023-02-02 DIAGNOSIS — K921 Melena: Secondary | ICD-10-CM | POA: Insufficient documentation

## 2023-02-02 DIAGNOSIS — E1159 Type 2 diabetes mellitus with other circulatory complications: Secondary | ICD-10-CM

## 2023-02-02 DIAGNOSIS — Z952 Presence of prosthetic heart valve: Secondary | ICD-10-CM | POA: Insufficient documentation

## 2023-02-02 DIAGNOSIS — Z951 Presence of aortocoronary bypass graft: Secondary | ICD-10-CM | POA: Insufficient documentation

## 2023-02-02 DIAGNOSIS — I152 Hypertension secondary to endocrine disorders: Secondary | ICD-10-CM | POA: Diagnosis present

## 2023-02-02 DIAGNOSIS — I350 Nonrheumatic aortic (valve) stenosis: Secondary | ICD-10-CM | POA: Insufficient documentation

## 2023-02-02 DIAGNOSIS — E119 Type 2 diabetes mellitus without complications: Secondary | ICD-10-CM

## 2023-02-02 DIAGNOSIS — I447 Left bundle-branch block, unspecified: Secondary | ICD-10-CM | POA: Diagnosis not present

## 2023-02-02 LAB — ECHOCARDIOGRAM COMPLETE
AR max vel: 0.98 cm2
AV Area VTI: 1.1 cm2
AV Area mean vel: 0.99 cm2
AV Mean grad: 37 mm[Hg]
AV Peak grad: 72.5 mm[Hg]
Ao pk vel: 4.26 m/s
Area-P 1/2: 2.91 cm2
S' Lateral: 3.1 cm

## 2023-02-02 NOTE — Progress Notes (Addendum)
Virtual Visit via Telephone Note   Because of Jodi Bell's co-morbid illnesses, she is at least at moderate risk for complications without adequate follow up.  This format is felt to be most appropriate for this patient at this time.  The patient did not have access to video technology/had technical difficulties with video requiring transitioning to audio format only (telephone).  All issues noted in this document were discussed and addressed.  No physical exam could be performed with this format.  Please refer to the patient's chart for her consent to telehealth for [facility].    Date:  02/02/2023   ID:  Jodi Bell, DOB 10-08-1960, MRN [id number] The patient was identified using 2 identifiers.  Patient Location: Home Provider Location: Office/Clinic   PCP:  Genelle Gather, MD   [facility] Providers Cardiologist: Donato Schultz, MD/ Dr. Lynnette Caffey & Dr. Laneta Simmers (TAVR)  Evaluation Performed:  Follow-Up Visit  Chief Complaint:  1 month s/p TAVR  History of Present Illness:    Jodi Bell is a 62 y.o. female with a history of CAD s/p CABGx2V (LIMA-->LAD, SVG-->PDA in 2022 by Dr. 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 Given this, she was called for a telephone follow up post TAVR.   [name] aortic stenosis was first evaluated around 2022 at [facility] while undergoing a renal transplant evaluation. At that time she had normal LV function with mild LVH, trivial AI/MR, and mild to moderate AS. She later presented to [facility] with NSTEMI and ultimately underwent two vessel CABG in 2022 with Dr. Laneta Simmers. She has had ongoing issues with COPD exacerbations and acute CHF c/b hypoxemia requiring hospitalizations. She contracted  COVID in early 2023 and since that time has developed an oxygen requirement. She was evaluated by pulmonology and despite rather long smoking history, her PFTs showed no evidence of COPD but was concerning for interstitial lung disease. High-resolution chest CT in 2023 was negative for ILD.    Echo 07/02/22 at [facility] showed progression of AS into the severe range with a mean gradient at 37 mmHg and she was referred to the Structural Heart Team for evaluation. [facility] 07/24/22 showed patent LIMA to LAD and occluded SVG to PDA with occluded LAD and mRCA with moderate disease of the mLCx. CT scans 07/24/22 showed dense calcium at the sub-annular left and right cusps with concern for severe post TAVR perivalvular leak or annular disruption with deployment. She was relatively asymptomatic at the time so continued surveillance was recommended. Repeat echo 12/09/22 showed EF 60-65%, severe concentric LVH, and critical AS with a mean grad 52 mmHg, AVA 0.80 cm2, and mod-severe TR. She was seen back in the office and reported lifestyle limiting shortness of breath. Case discussed with multidisciplinary team with plans to proceed with TAVR despite higher than normal risks. This was set up for 12/23/2022.     In follow up, she was doing great with no complaints. Echocardiogram this morning shows normal LVEF at 60-65% with elevated gradients however comparable to prior post op TAVR echo. Mean gradient today at with EOA 1.1cm2, and DI at 0.34. Echo reviewed by Dr. Lynnette Caffey who felt gradients were due to ESRD and recent GI bleeding. She denies symptoms of  SOB, LE edema, orthopnea, PND, dizziness, or syncope. Could consider TEE to follow leaflet function in the future. She was recently in the hospital with GI bleeding. Endoscopy showed two angoidysplastic lesions treat with ablation. ASA was held until today, 12/23. She does not have follow up with GI per EPIC review. Hb at HD was improved but remained low at 7.6 (up  from 5.7).   Past Medical History:  Diagnosis Date   Anemia of chronic disease    Anxiety    Arthritis    oa and psoriatic ra   Asthma    Back pain, chronic    lower back   Chronic insomnia    COPD (chronic obstructive pulmonary disease) (HCC)    Diabetes mellitus    type 2   ESRD on dialysis [facility])    Essential hypertension    GERD (gastroesophageal reflux disease)    Gout    History of hiatal hernia    Hypoalbuminemia    Hyponatremia    Mild aortic stenosis    Mitral regurgitation    Mitral stenosis    Obesity    PAD (peripheral artery disease) (HCC)    a. externial iliac calcification seen on CT 04/2020.   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 Lynnette Caffey & Laneta Simmers.   Sleep apnea    Tobacco abuse    Upper GI bleed 05/2019   Past Surgical History:  Procedure Laterality Date   BACK SURGERY  2016   lower back fusion with cage   BIOPSY  09/23/2017   Procedure: BIOPSY;  Surgeon: Graylin Shiver, MD;  Location: [facility] ENDOSCOPY;  Service: Endoscopy;;   BIOPSY  05/19/2019   Procedure: BIOPSY;  Surgeon: Kathi Der, MD;  Location: [facility] ENDOSCOPY;  Service: Gastroenterology;;   CESAREAN SECTION  1990   x 1    COLONOSCOPY WITH PROPOFOL N/A 09/23/2017   Procedure: COLONOSCOPY WITH PROPOFOL Hemostatic clips placed;  Surgeon: Graylin Shiver, MD;  Location: [facility] ENDOSCOPY;  Service: Endoscopy;  Laterality: N/A;   COLONOSCOPY WITH PROPOFOL N/A 10/02/2020   Procedure: COLONOSCOPY WITH PROPOFOL;  Surgeon: Willis Modena, MD;  Location: [facility] ENDOSCOPY;  Service: Endoscopy;  Laterality: N/A;   CORONARY ARTERY BYPASS GRAFT N/A 09/13/2020   Procedure: CORONARY ARTERY BYPASS GRAFTING (CABG), ON PUMP, TIMES TWO, USING LEFT INTERNAL MAMMARY ARTERY AND RIGHT ENDOSCOPICALLY HARVESTED GREATER SAPHENOUS VEIN;  Surgeon: Alleen Borne, MD;  Location: [facility] OR;  Service: Open Heart Surgery;  Laterality: N/A;   DILATION AND  CURETTAGE OF UTERUS  1988   ENTEROSCOPY N/A 07/22/2021   Procedure: ENTEROSCOPY;  Surgeon: Kerin Salen, MD;  Location: [facility] ENDOSCOPY;  Service: Gastroenterology;  Laterality: N/A;   ENTEROSCOPY N/A 01/28/2023   Procedure: ENTEROSCOPY;  Surgeon: [name], MD;  Location: [facility] ENDOSCOPY;  Service: Gastroenterology;  Laterality: N/A;   ESOPHAGOGASTRODUODENOSCOPY N/A 09/22/2020   Procedure: ESOPHAGOGASTRODUODENOSCOPY (EGD);  Surgeon: Willis Modena, MD;  Location: [facility] ENDOSCOPY;  Service: Endoscopy;  Laterality: N/A;   ESOPHAGOGASTRODUODENOSCOPY (EGD) WITH PROPOFOL N/A 09/23/2017   Procedure: ESOPHAGOGASTRODUODENOSCOPY (EGD) WITH PROPOFOL;  Surgeon: Graylin Shiver, MD;  [facility]: [facility] ENDOSCOPY;  Service: Endoscopy;  Laterality: N/A;   ESOPHAGOGASTRODUODENOSCOPY (EGD) WITH PROPOFOL N/A 05/19/2019   Procedure: ESOPHAGOGASTRODUODENOSCOPY (EGD) WITH PROPOFOL;  Surgeon: Kathi Der, MD;  [facility]: [facility] ENDOS[facility];  Service: Gastroenterology;  Laterality: N/A;   ESOPHAGOGASTRODUODENOSCOPY (EGD) WITH PROPOFOL N/A 10/02/2020   Procedure: ESOPHAGOGASTRODUODENOSCOPY (EGD) WITH PROPOFOL;  Surgeon: Willis Modena,  MD;  Location: [facility] ENDOSCOPY;  Service: Endoscopy;  Laterality: N/A;   GIVENS CAPSULE STUDY N/A 05/19/2019   Procedure: GIVENS CAPSULE STUDY;  Surgeon: Kathi Der, MD;  Location: [facility] ENDOSCOPY;  Service: Gastroenterology;  Laterality: N/A;   GIVENS CAPSULE STUDY N/A 07/19/2021   Procedure: GIVENS CAPSULE STUDY;  Surgeon: [name], MD;  Location: [facility] ENDOSCOPY;  Service: Gastroenterology;  Laterality: N/A;   HEMOSTASIS CLIP PLACEMENT  09/22/2020   Procedure: HEMOSTASIS CLIP PLACEMENT;  Surgeon: Willis Modena, MD;  Location: [facility] ENDOS[facility];  Service: Endoscopy;;   HEMOSTASIS CONTROL  09/22/2020   Procedure: HEMOSTASIS CONTROL;  Surgeon: Willis Modena, MD;  Location: [facility] ENDOSCOPY;  Service: Endoscopy;;   HOT HEMOSTASIS N/A 05/19/2019   Procedure: HOT HEMOSTASIS (ARGON PLASMA COAGULATION/BICAP);  Surgeon:  Kathi Der, MD;  Location: [facility] ENDOSCOPY;  Service: Gastroenterology;  Laterality: N/A;   HOT HEMOSTASIS N/A 01/28/2023   Procedure: HOT HEMOSTASIS (ARGON PLASMA COAGULATION/BICAP);  Surgeon: [name], MD;  Location: [facility] ENDOSCOPY;  Service: Gastroenterology;  Laterality: N/A;   INTRAOPERATIVE TRANSTHORACIC ECHOCARDIOGRAM N/A 12/23/2022   Procedure: INTRAOPERATIVE TRANSTHORACIC ECHOCARDIOGRAM;  Surgeon: [name], MD;  [facility] INVASIVE CV LAB;  Service: Cardiovascular;  Laterality: N/A;   LEFT HEART CATH AND CORONARY ANGIOGRAPHY N/A 09/10/2020   Procedure: LEFT HEART CATH AND CORONARY ANGIOGRAPHY;  Surgeon: Yvonne Kendall, MD;  [facility]: [facility] INVASIVE CV LAB;  Service: Cardiovascular;  Laterality: N/A;   PARTIAL KNEE ARTHROPLASTY Left 11/12/2017   Procedure: left unicompartmental arthroplasty-medial;  Surgeon: Durene Romans, MD;  Location: [facility] ORS;  Service: Orthopedics;  Laterality: Left;    RIGHT HEART CATH AND CORONARY/GRAFT ANGIOGRAPHY N/A 06/26/2022   Procedure: RIGHT HEART CATH AND CORONARY/GRAFT ANGIOGRAPHY;  Surgeon: [name], MD;  [facility]: [facility] INVASIVE CV LAB;  Service: Cardiovascular;  Laterality: N/A;   SUBMUCOSAL INJECTION  09/23/2017   Procedure: SUBMUCOSAL INJECTION;  Surgeon: Graylin Shiver, MD;  Location: [facility] ENDOSCOPY;  Service: Endoscopy;;  in colon   TEE WITHOUT CARDIOVERSION N/A 09/13/2020   Procedure: TRANSESOPHAGEAL ECHOCARDIOGRAM (TEE);  Surgeon: Alleen Borne, MD;  Location: [facility] OR;  Service: Open Heart Surgery;  Laterality: N/A;   TUBAL LIGATION  1990     No outpatient medications have been marked as taking for the 02/02/23 encounter (Office Visit) with Filbert Schilder, NP.     Allergies:   3-methyl-2-benzothiazolinone hydrazone, Banana, Black walnut flavoring agent (non-screening), Hazelnut (filbert), Leflunomide and related, Lisinopril, No healthtouch food allergies, Other, Pecan nut (diagnostic), Trazodone and nefazodone, Adalimumab,  Escitalopram oxalate, Ezetimibe, Secukinumab, Statins, Ferrlecit [na ferric gluc cplx in sucrose], Gabapentin, and Nsaids   Social History   Tobacco Use   Smoking status: Former    Current packs/day: 0.00    Average packs/day: 0.5 packs/day for 40.0 years (20.0 ttl pk-yrs)    Types: Cigarettes    Start date: 08/1980    Quit date: 08/2020    Years since quitting: 2.4   Smokeless tobacco: Never  Vaping Use   Vaping status: Never Used  Substance Use Topics   Alcohol use: Yes    Comment: occ   Drug use: No     Family Hx: The patient's family history includes Arthritis in her daughter and daughter; Asthma in her daughter; Diabetes in her sister; Heart disease in her sister and another family member; Lung cancer in her mother; Thyroid cancer in an other family member.  ROS:   Please see the history of present illness.     All other systems reviewed and are  negative.   Labs/Other Tests and Data Reviewed:    EKG:  No ECG reviewed.  Recent Labs: 09/20/2022: TSH 3.761 01/02/2023: ALT 11 01/03/2023: Magnesium 1.7 01/29/2023: BUN 21; Creatinine, Ser 3.54; Hemoglobin 7.6; Platelets 101; Potassium 3.9; Sodium 135   Recent Lipid Panel Lab Results  Component Value Date/Time   CHOL 131 07/16/2021 01:16 PM   TRIG 34 07/16/2021 01:16 PM   HDL 55 07/16/2021 01:16 PM   CHOLHDL 2.4 07/16/2021 01:16 PM   LDLCALC 69 07/16/2021 01:16 PM    Wt Readings from Last 3 Encounters:  01/29/23 141 lb 1.5 oz (64 kg)  01/06/23 150 lb 2.1 oz (68.1 kg)  12/29/22 131 lb 12.8 oz (59.8 kg)       Objective:    Vital Signs:  There were no vitals taken for this visit.   VITAL SIGNS:  reviewed GEN:  no acute distress  ASSESSMENT & PLAN:    Critical AS: s/p successful TAVR with a 23 mm Edwards Sapien 3 THV via the TF approach on 12/23/22. Post operative echo showed EF 65%, severe LVH, moderate RV dysfunction, moderate to severe MAC with mild MR/mod MS, severe TR and s/p TAVR with a mean gradient of  40 mmHg and mild PVL. LVOT VTI elevated at 38 cm (likely over estimated) with SV 119 cc. Valve reported to be under expanded. Some of the increased gradient could be flow related, but also there is concerns for prosthetic valve stenosis. This is likely due to under-expansion due to heavy annular calcification and patient prosthesis mismatch with no further intervention. Echo today with stable mean gradient at and otherwise no essential change. Reviewed with Dr. Lynnette Caffey who felt theres no utility in TEE at this time and to continue following with repeat imaging. Restarted ASA today after recent GI bleeding. Continue lifelong dental SBE with Amoxicillin. Plan one year follow up with echocardiogram and Dr. Antoine Poche 05/2023 in the interm.   GI bleeding with chronic anemia: Recent admission for GI bleeding with critial Hb at 5.7. Endoscopy with AVMs treated with ablation. ASA held until today 12/23. Hb 12/20 improved to 7.6. Encouraged to follow up with GI to schedule appointment as well as PCP.   New LBBB: Noted on post TAVR EKG. Persistent on post op EKG. Denies symptoms. ZIO placed however not yet reviewed.    CAD s/p CABGx2V: [facility] 07/24/22 showed patent LIMA to LAD and occluded SVG to PDA with occluded LAD and mRCA with moderate disease of the mLCx. Denies anginal symptoms. Continue ASA, Coreg 25mg  BID. Intolerant to statin therapy.    Chronic HFpEF: NYHA class II symptoms. Fluid managed with HD.    ESRD on HD (MWF): HD MWF schedule with recent shift to afternoons. See plan above.    Chronic respiratory failure on home 02:  Not recently needed since TAVR.    HTN: Unable to provide BP today. Continue coreg 25mg  BID, clonidine patch 0.3mg /24 hour, hydralazine 100mg  TID, imdur 60mg  daily, nifedipine xl 60mg  daily, and olmesartan 40mg  daily.     Cardiac Rehabilitation Eligibility Assessment  The patient is ready to start cardiac rehabilitation from a cardiac standpoint.        Time:    Today, I have spent 20 minutes with the patient with telehealth technology discussing the above problems.     Medication Adjustments/Labs and Tests Ordered: Current medicines are reviewed at length with the patient today.  Concerns regarding medicines are outlined above.   Tests Ordered: No orders of the defined  types were placed in this encounter.   Medication Changes: No orders of the defined types were placed in this encounter.   Follow Up:  In Person  with Dr. Antoine Poche 05/2023 an dour team in one year with echo   Signed, Georgie Chard, NP  02/02/2023 4:43 PM    [facility]

## 2023-02-06 ENCOUNTER — Encounter: Payer: Self-pay | Admitting: Cardiology

## 2023-02-06 ENCOUNTER — Other Ambulatory Visit: Payer: Self-pay | Admitting: Cardiology

## 2023-02-06 ENCOUNTER — Telehealth: Payer: Self-pay | Admitting: Cardiology

## 2023-02-06 DIAGNOSIS — Z952 Presence of prosthetic heart valve: Secondary | ICD-10-CM

## 2023-02-06 NOTE — Telephone Encounter (Addendum)
Patient returned call and echo results reviewed. Offered to follow with repeat echo in 3 months versus cardiac CTA to evaluate elevated AV gradients on post-op and one month echo to ensure no leaflet thrombosis. Patient scheduled for cardiac CTA 02/24/2023. Instruction letter sent through MyChart and mailed to patients home.   Georgie Chard NP-C Structural Heart Team  Phone: [phone]

## 2023-02-12 NOTE — Telephone Encounter (Signed)
 Pt called about cardiac CT. She never got a mychart message and has not yet received a letter in the mail (may be delays with holidays). I made her aware of her CT scans (sent the letter in an email) and changed in 1 year apt date to Jill McDaniel's schedule. Pt aware of changes.    Lamarr Hummer PA-C  [facility]

## 2023-02-18 ENCOUNTER — Emergency Department (HOSPITAL_COMMUNITY)
Admission: EM | Admit: 2023-02-18 | Discharge: 2023-02-18 | Discharge status: Against Medical Advice | Payer: Medicare PPO | Attending: Emergency Medicine | Admitting: Emergency Medicine

## 2023-02-18 ENCOUNTER — Other Ambulatory Visit: Payer: Self-pay

## 2023-02-18 DIAGNOSIS — Z20822 Contact with and (suspected) exposure to covid-19: Secondary | ICD-10-CM | POA: Diagnosis not present

## 2023-02-18 DIAGNOSIS — Z5321 Procedure and treatment not carried out due to patient leaving prior to being seen by health care provider: Secondary | ICD-10-CM | POA: Diagnosis not present

## 2023-02-18 DIAGNOSIS — M791 Myalgia, unspecified site: Secondary | ICD-10-CM | POA: Diagnosis present

## 2023-02-18 LAB — CBC WITH DIFFERENTIAL/PLATELET
Abs Immature Granulocytes: 0.04 10*3/uL (ref 0.00–0.07)
Basophils Absolute: 0 10*3/uL (ref 0.0–0.1)
Basophils Relative: 1 %
Eosinophils Absolute: 0.5 10*3/uL (ref 0.0–0.5)
Eosinophils Relative: 6 %
HCT: 28.9 % — ABNORMAL LOW (ref 36.0–46.0)
Hemoglobin: 8.6 g/dL — ABNORMAL LOW (ref 12.0–15.0)
Immature Granulocytes: 1 %
Lymphocytes Relative: 9 %
Lymphs Abs: 0.8 10*3/uL (ref 0.7–4.0)
MCH: 32.3 pg (ref 26.0–34.0)
MCHC: 29.8 g/dL — ABNORMAL LOW (ref 30.0–36.0)
MCV: 108.6 fL — ABNORMAL HIGH (ref 80.0–100.0)
Monocytes Absolute: 0.5 10*3/uL (ref 0.1–1.0)
Monocytes Relative: 6 %
Neutro Abs: 6.5 10*3/uL (ref 1.7–7.7)
Neutrophils Relative %: 77 %
Platelets: 156 10*3/uL (ref 150–400)
RBC: 2.66 MIL/uL — ABNORMAL LOW (ref 3.87–5.11)
RDW: 18.9 % — ABNORMAL HIGH (ref 11.5–15.5)
WBC: 8.3 10*3/uL (ref 4.0–10.5)
nRBC: 0.2 % (ref 0.0–0.2)

## 2023-02-18 LAB — RESP PANEL BY RT-PCR (RSV, FLU A&B, COVID)  RVPGX2
Influenza A by PCR: NEGATIVE
Influenza B by PCR: NEGATIVE
Resp Syncytial Virus by PCR: NEGATIVE
SARS Coronavirus 2 by RT PCR: NEGATIVE

## 2023-02-18 LAB — COMPREHENSIVE METABOLIC PANEL
ALT: 9 U/L (ref 0–44)
AST: 13 U/L — ABNORMAL LOW (ref 15–41)
Albumin: 3.1 g/dL — ABNORMAL LOW (ref 3.5–5.0)
Alkaline Phosphatase: 76 U/L (ref 38–126)
Anion gap: 10 (ref 5–15)
BUN: 29 mg/dL — ABNORMAL HIGH (ref 8–23)
CO2: 26 mmol/L (ref 22–32)
Calcium: 9.3 mg/dL (ref 8.9–10.3)
Chloride: 101 mmol/L (ref 98–111)
Creatinine, Ser: 4.64 mg/dL — ABNORMAL HIGH (ref 0.44–1.00)
GFR, Estimated: 10 mL/min — ABNORMAL LOW (ref >60)
Glucose, Bld: 78 mg/dL (ref 70–99)
Potassium: 3.6 mmol/L (ref 3.5–5.1)
Sodium: 137 mmol/L (ref 135–145)
Total Bilirubin: 0.7 mg/dL (ref 0.0–1.2)
Total Protein: 6.9 g/dL (ref 6.5–8.1)

## 2023-02-18 LAB — I-STAT CG4 LACTIC ACID, ED: Lactic Acid, Venous: 0.6 mmol/L (ref 0.5–1.9)

## 2023-02-18 NOTE — ED Provider Triage Note (Signed)
 Emergency Medicine Provider Triage Evaluation Note  Jodi Bell , a 63 y.o. female  was evaluated in triage.  Pt complains of pain all over.  This happened while she was at dialysis.  She took Tylenol  and now is completely gone.  She is upset because she wants to know what exactly it was.  Tells me that was worse than childbirth.  She is unable to localize it.  [place] it hurt everywhere.  Mostly in the joints.  She denies being sick recently.  She did have some blood in her stool over the past few days but has resolved.  She think she got about an hour of dialysis.  Review of Systems  Positive: Pain all over Negative: Fever, cough  Physical Exam  BP (!) 194/68 (BP Location: Right Arm)   Pulse 64   Temp 98.1 F (36.7 C) (Oral)   Resp 20   SpO2 100%  Gen:   Awake, no distress   Resp:  Normal effort  MSK:   Moves extremities without difficulty  Other:  On chronic O2, no tachypnea  Medical Decision Making  Medically screening exam initiated at 8:43 AM.  Appropriate orders placed.  Jodi Bell was informed that the remainder of the evaluation will be completed by another provider, this initial triage assessment does not replace that evaluation, and the importance of remaining in the ED until their evaluation is complete.  Patient with pain all over.  Resolved with Tylenol .  Could be myalgias or rigors.  On dialysis is a higher incidence of bacteremia.  Will obtain blood cultures lactate ASIC blood work as she did not finish dialysis.  Patient also complaining of recent bleeding.  Was just admitted for a GI bleed.  Check hemoglobin.   Jodi Share, DO 02/18/23 [phone]

## 2023-02-18 NOTE — ED Triage Notes (Signed)
 Pt BIB [facility] from Dialysis.  Had 50 minutes of tx. MWF.  Complaining of bone and joint pain.  Also GI Epigastric pain with loose stools, blood.  Took tylenol this AM.   160/80 HR 64 99% 2L CBG 100

## 2023-02-19 ENCOUNTER — Other Ambulatory Visit (HOSPITAL_COMMUNITY): Payer: Medicare PPO

## 2023-02-19 ENCOUNTER — Ambulatory Visit: Payer: Medicare PPO | Admitting: Internal Medicine

## 2023-02-23 LAB — CULTURE, BLOOD (ROUTINE X 2)
Culture: NO GROWTH
Special Requests: ADEQUATE

## 2023-02-24 ENCOUNTER — Encounter (HOSPITAL_COMMUNITY): Payer: Self-pay

## 2023-02-24 ENCOUNTER — Ambulatory Visit (HOSPITAL_COMMUNITY)
Admission: RE | Admit: 2023-02-24 | Discharge: 2023-02-24 | Disposition: A | Discharge status: Home / Self Care | Payer: Medicare PPO | Source: Ambulatory Visit | Attending: Cardiology | Admitting: Cardiology

## 2023-02-24 ENCOUNTER — Other Ambulatory Visit: Payer: Self-pay | Admitting: Cardiology

## 2023-02-24 DIAGNOSIS — Z952 Presence of prosthetic heart valve: Secondary | ICD-10-CM

## 2023-02-24 NOTE — Progress Notes (Signed)
 Pt wanting to reschedule CT valve eval due to 2 failed attempts with IV ultrasound. Communicated with nurse navigator to reschedule. Explained to pt and pt's daughter.

## 2023-03-10 ENCOUNTER — Ambulatory Visit (HOSPITAL_COMMUNITY)
Admission: RE | Admit: 2023-03-10 | Discharge: 2023-03-10 | Disposition: A | Discharge status: Home / Self Care | Payer: Medicare PPO | Source: Ambulatory Visit | Attending: Internal Medicine | Admitting: Internal Medicine

## 2023-03-10 ENCOUNTER — Telehealth: Payer: Self-pay

## 2023-03-10 DIAGNOSIS — Z952 Presence of prosthetic heart valve: Secondary | ICD-10-CM | POA: Diagnosis not present

## 2023-03-10 DIAGNOSIS — I447 Left bundle-branch block, unspecified: Secondary | ICD-10-CM | POA: Diagnosis not present

## 2023-03-10 DIAGNOSIS — I700 Atherosclerosis of aorta: Secondary | ICD-10-CM | POA: Insufficient documentation

## 2023-03-10 MED ORDER — IOHEXOL 350 MG/ML SOLN
95.0000 mL | Freq: Once | INTRAVENOUS | Status: AC | PRN
  Administered 2023-03-10: 95 mL via INTRAVENOUS

## 2023-03-10 NOTE — Telephone Encounter (Signed)
   Pre-operative Risk Assessment    Patient Name: Jodi Bell  DOB: [birth year] MRN: [id number]   Date of last office visit: 02/02/23 Date of next office visit: 05/13/23  Request for Surgical Clearance    Procedure:  Dental Extraction - Amount of Teeth to be Pulled:  (LVM with office for clarification of how many extractions  Date of Surgery:  Clearance TBD                                Surgeon:  Not indicated Surgeon's Group or Practice Name:  [facility] Phone number:  [phone] Fax number:  [phone]   Type of Clearance Requested:   - Medical    Type of Anesthesia:  Local and Nitrous Oxide   Additional requests/questions:    Garrel Ridgel   03/10/2023, 12:25 PM

## 2023-03-12 NOTE — Telephone Encounter (Signed)
Called back to say it will be 1 tooth. Please advise

## 2023-03-16 NOTE — Telephone Encounter (Signed)
Preoperative team, patient's procedure is TBD.  She has upcoming appointment scheduled with Dr. Antoine Poche on 05/13/2023.  Please add preoperative cardiac evaluation to appointment notes.  I will defer recommendations to upcoming exam.  Thank you for your help.  [name]. Meha Vidrine NP-C     03/16/2023, 2:47 PM [facility] [address] Office [phone] Fax [phone]

## 2023-03-16 NOTE — Telephone Encounter (Signed)
2 teeth are to be extracted.  Per pt, she can't wait until April, as she is having a lot of pain, as 1 is a cavity.

## 2023-03-16 NOTE — Telephone Encounter (Signed)
Jodi Shell, Ms. [name] was recently seen by you on 02/02/2023 post TAVR.  She is now requesting cardiac recommendations for having 2 teeth removed and possibly a filling.  Would you be able to advise on upcoming procedure and cardiac risk?  Thank you for your help.  [name]. Khai Arrona NP-C     03/16/2023, 3:29 PM [facility] [address] Office [phone] Fax [phone]

## 2023-03-19 MED ORDER — AMOXICILLIN 500 MG PO CAPS: 2000.0000 mg | ORAL_CAPSULE | ORAL | 12 refills | Status: DC

## 2023-03-19 NOTE — Telephone Encounter (Signed)
   Patient Name: Jodi Bell  DOB: [birth year] MRN: [id number]  Primary Cardiologist: Oneil Parchment, MD  Chart reviewed as part of pre-operative protocol coverage.   Simple dental extractions (i.e. 1-2 teeth) are considered low risk procedures per guidelines and generally do not require any specific cardiac clearance. It is also generally accepted that for simple extractions and dental cleanings, there is no need to interrupt blood thinner therapy.  Per chart review, the patient will have one tooth extracted. Ms. Josten has been doing very well since TAVR 12/23/2022. She does not have any unstable cardiac conditions and can achieve 4 METs or greater without anginal symptoms. According to [facility] and AHA guidelines, she requires no further cardiac workup prior to single tooth dental extraction.  SBE prophylaxis is required for the patient from a cardiac standpoint with Amoxicillin  2g one hour prior to extraction.   I will route this recommendation to the requesting party via Epic fax function and remove from pre-op pool.  Please call with questions.  Kate Minus, NP 03/19/2023, 9:49 AM

## 2023-03-28 ENCOUNTER — Emergency Department (HOSPITAL_COMMUNITY): Payer: Medicare PPO

## 2023-03-28 ENCOUNTER — Other Ambulatory Visit: Payer: Self-pay

## 2023-03-28 ENCOUNTER — Inpatient Hospital Stay (HOSPITAL_COMMUNITY)
Admission: EM | Admit: 2023-03-28 | Discharge: 2023-04-01 | DRG: 091 | Disposition: A | Discharge status: Home Health | Payer: Medicare PPO | Attending: Internal Medicine | Admitting: Internal Medicine

## 2023-03-28 DIAGNOSIS — R531 Weakness: Secondary | ICD-10-CM | POA: Diagnosis not present

## 2023-03-28 DIAGNOSIS — Z7982 Long term (current) use of aspirin: Secondary | ICD-10-CM

## 2023-03-28 DIAGNOSIS — Z801 Family history of malignant neoplasm of trachea, bronchus and lung: Secondary | ICD-10-CM

## 2023-03-28 DIAGNOSIS — E876 Hypokalemia: Secondary | ICD-10-CM | POA: Diagnosis present

## 2023-03-28 DIAGNOSIS — K5520 Angiodysplasia of colon without hemorrhage: Secondary | ICD-10-CM | POA: Diagnosis present

## 2023-03-28 DIAGNOSIS — M1990 Unspecified osteoarthritis, unspecified site: Secondary | ICD-10-CM | POA: Diagnosis present

## 2023-03-28 DIAGNOSIS — Z992 Dependence on renal dialysis: Secondary | ICD-10-CM

## 2023-03-28 DIAGNOSIS — E119 Type 2 diabetes mellitus without complications: Secondary | ICD-10-CM

## 2023-03-28 DIAGNOSIS — Z96652 Presence of left artificial knee joint: Secondary | ICD-10-CM | POA: Diagnosis present

## 2023-03-28 DIAGNOSIS — Z825 Family history of asthma and other chronic lower respiratory diseases: Secondary | ICD-10-CM

## 2023-03-28 DIAGNOSIS — R296 Repeated falls: Secondary | ICD-10-CM | POA: Diagnosis present

## 2023-03-28 DIAGNOSIS — T50915A Adverse effect of multiple unspecified drugs, medicaments and biological substances, initial encounter: Secondary | ICD-10-CM | POA: Diagnosis present

## 2023-03-28 DIAGNOSIS — D638 Anemia in other chronic diseases classified elsewhere: Secondary | ICD-10-CM | POA: Diagnosis not present

## 2023-03-28 DIAGNOSIS — F411 Generalized anxiety disorder: Secondary | ICD-10-CM | POA: Diagnosis present

## 2023-03-28 DIAGNOSIS — W19XXXA Unspecified fall, initial encounter: Secondary | ICD-10-CM

## 2023-03-28 DIAGNOSIS — Z808 Family history of malignant neoplasm of other organs or systems: Secondary | ICD-10-CM

## 2023-03-28 DIAGNOSIS — Z9981 Dependence on supplemental oxygen: Secondary | ICD-10-CM

## 2023-03-28 DIAGNOSIS — G928 Other toxic encephalopathy: Principal | ICD-10-CM | POA: Diagnosis present

## 2023-03-28 DIAGNOSIS — H0520 Unspecified exophthalmos: Secondary | ICD-10-CM | POA: Diagnosis present

## 2023-03-28 DIAGNOSIS — G8929 Other chronic pain: Secondary | ICD-10-CM | POA: Diagnosis present

## 2023-03-28 DIAGNOSIS — Z91018 Allergy to other foods: Secondary | ICD-10-CM

## 2023-03-28 DIAGNOSIS — Z5982 Transportation insecurity: Secondary | ICD-10-CM

## 2023-03-28 DIAGNOSIS — K5730 Diverticulosis of large intestine without perforation or abscess without bleeding: Secondary | ICD-10-CM | POA: Diagnosis present

## 2023-03-28 DIAGNOSIS — G9341 Metabolic encephalopathy: Secondary | ICD-10-CM | POA: Diagnosis not present

## 2023-03-28 DIAGNOSIS — K648 Other hemorrhoids: Secondary | ICD-10-CM | POA: Diagnosis present

## 2023-03-28 DIAGNOSIS — N2581 Secondary hyperparathyroidism of renal origin: Secondary | ICD-10-CM | POA: Diagnosis present

## 2023-03-28 DIAGNOSIS — Z87891 Personal history of nicotine dependence: Secondary | ICD-10-CM

## 2023-03-28 DIAGNOSIS — K921 Melena: Secondary | ICD-10-CM | POA: Diagnosis not present

## 2023-03-28 DIAGNOSIS — Z886 Allergy status to analgesic agent status: Secondary | ICD-10-CM

## 2023-03-28 DIAGNOSIS — N186 End stage renal disease: Secondary | ICD-10-CM | POA: Diagnosis present

## 2023-03-28 DIAGNOSIS — E1151 Type 2 diabetes mellitus with diabetic peripheral angiopathy without gangrene: Secondary | ICD-10-CM | POA: Diagnosis present

## 2023-03-28 DIAGNOSIS — K7460 Unspecified cirrhosis of liver: Secondary | ICD-10-CM | POA: Diagnosis present

## 2023-03-28 DIAGNOSIS — Y92009 Unspecified place in unspecified non-institutional (private) residence as the place of occurrence of the external cause: Secondary | ICD-10-CM

## 2023-03-28 DIAGNOSIS — J4489 Other specified chronic obstructive pulmonary disease: Secondary | ICD-10-CM | POA: Diagnosis present

## 2023-03-28 DIAGNOSIS — K449 Diaphragmatic hernia without obstruction or gangrene: Secondary | ICD-10-CM | POA: Diagnosis present

## 2023-03-28 DIAGNOSIS — Z79899 Other long term (current) drug therapy: Secondary | ICD-10-CM

## 2023-03-28 DIAGNOSIS — M549 Dorsalgia, unspecified: Secondary | ICD-10-CM | POA: Diagnosis present

## 2023-03-28 DIAGNOSIS — D62 Acute posthemorrhagic anemia: Secondary | ICD-10-CM | POA: Diagnosis present

## 2023-03-28 DIAGNOSIS — I5032 Chronic diastolic (congestive) heart failure: Secondary | ICD-10-CM | POA: Diagnosis present

## 2023-03-28 DIAGNOSIS — I959 Hypotension, unspecified: Secondary | ICD-10-CM | POA: Diagnosis present

## 2023-03-28 DIAGNOSIS — Z888 Allergy status to other drugs, medicaments and biological substances status: Secondary | ICD-10-CM

## 2023-03-28 DIAGNOSIS — I447 Left bundle-branch block, unspecified: Secondary | ICD-10-CM | POA: Diagnosis present

## 2023-03-28 DIAGNOSIS — E162 Hypoglycemia, unspecified: Secondary | ICD-10-CM | POA: Insufficient documentation

## 2023-03-28 DIAGNOSIS — I950 Idiopathic hypotension: Secondary | ICD-10-CM

## 2023-03-28 DIAGNOSIS — Z833 Family history of diabetes mellitus: Secondary | ICD-10-CM

## 2023-03-28 DIAGNOSIS — Z7984 Long term (current) use of oral hypoglycemic drugs: Secondary | ICD-10-CM

## 2023-03-28 DIAGNOSIS — D631 Anemia in chronic kidney disease: Secondary | ICD-10-CM | POA: Diagnosis present

## 2023-03-28 DIAGNOSIS — Z8249 Family history of ischemic heart disease and other diseases of the circulatory system: Secondary | ICD-10-CM

## 2023-03-28 DIAGNOSIS — L4050 Arthropathic psoriasis, unspecified: Secondary | ICD-10-CM | POA: Diagnosis present

## 2023-03-28 DIAGNOSIS — R410 Disorientation, unspecified: Principal | ICD-10-CM

## 2023-03-28 DIAGNOSIS — D693 Immune thrombocytopenic purpura: Secondary | ICD-10-CM | POA: Diagnosis present

## 2023-03-28 DIAGNOSIS — K8020 Calculus of gallbladder without cholecystitis without obstruction: Secondary | ICD-10-CM | POA: Diagnosis present

## 2023-03-28 DIAGNOSIS — I132 Hypertensive heart and chronic kidney disease with heart failure and with stage 5 chronic kidney disease, or end stage renal disease: Secondary | ICD-10-CM | POA: Diagnosis present

## 2023-03-28 DIAGNOSIS — J9611 Chronic respiratory failure with hypoxia: Secondary | ICD-10-CM | POA: Diagnosis present

## 2023-03-28 DIAGNOSIS — J4520 Mild intermittent asthma, uncomplicated: Secondary | ICD-10-CM

## 2023-03-28 DIAGNOSIS — I700 Atherosclerosis of aorta: Secondary | ICD-10-CM | POA: Diagnosis present

## 2023-03-28 DIAGNOSIS — Z7952 Long term (current) use of systemic steroids: Secondary | ICD-10-CM

## 2023-03-28 DIAGNOSIS — I10 Essential (primary) hypertension: Secondary | ICD-10-CM | POA: Diagnosis present

## 2023-03-28 DIAGNOSIS — K219 Gastro-esophageal reflux disease without esophagitis: Secondary | ICD-10-CM | POA: Diagnosis present

## 2023-03-28 DIAGNOSIS — M109 Gout, unspecified: Secondary | ICD-10-CM | POA: Diagnosis present

## 2023-03-28 DIAGNOSIS — J45909 Unspecified asthma, uncomplicated: Secondary | ICD-10-CM | POA: Diagnosis present

## 2023-03-28 DIAGNOSIS — I2510 Atherosclerotic heart disease of native coronary artery without angina pectoris: Secondary | ICD-10-CM | POA: Diagnosis present

## 2023-03-28 DIAGNOSIS — Z952 Presence of prosthetic heart valve: Secondary | ICD-10-CM

## 2023-03-28 DIAGNOSIS — Z8679 Personal history of other diseases of the circulatory system: Secondary | ICD-10-CM

## 2023-03-28 DIAGNOSIS — Z981 Arthrodesis status: Secondary | ICD-10-CM

## 2023-03-28 DIAGNOSIS — I050 Rheumatic mitral stenosis: Secondary | ICD-10-CM | POA: Diagnosis present

## 2023-03-28 DIAGNOSIS — E1122 Type 2 diabetes mellitus with diabetic chronic kidney disease: Secondary | ICD-10-CM | POA: Diagnosis present

## 2023-03-28 DIAGNOSIS — G4730 Sleep apnea, unspecified: Secondary | ICD-10-CM | POA: Diagnosis present

## 2023-03-28 DIAGNOSIS — E11649 Type 2 diabetes mellitus with hypoglycemia without coma: Principal | ICD-10-CM | POA: Diagnosis present

## 2023-03-28 LAB — CBC WITH DIFFERENTIAL/PLATELET
Abs Immature Granulocytes: 0 10*3/uL (ref 0.00–0.07)
Basophils Absolute: 0.1 10*3/uL (ref 0.0–0.1)
Basophils Relative: 1 %
Eosinophils Absolute: 1.1 10*3/uL — ABNORMAL HIGH (ref 0.0–0.5)
Eosinophils Relative: 14 %
HCT: 26.8 % — ABNORMAL LOW (ref 36.0–46.0)
Hemoglobin: 7.5 g/dL — ABNORMAL LOW (ref 12.0–15.0)
Lymphocytes Relative: 8 %
Lymphs Abs: 0.6 10*3/uL — ABNORMAL LOW (ref 0.7–4.0)
MCH: 32.8 pg (ref 26.0–34.0)
MCHC: 28 g/dL — ABNORMAL LOW (ref 30.0–36.0)
MCV: 117 fL — ABNORMAL HIGH (ref 80.0–100.0)
Monocytes Absolute: 0.4 10*3/uL (ref 0.1–1.0)
Monocytes Relative: 5 %
Neutro Abs: 5.4 10*3/uL (ref 1.7–7.7)
Neutrophils Relative %: 72 %
Platelets: 110 10*3/uL — ABNORMAL LOW (ref 150–400)
RBC: 2.29 MIL/uL — ABNORMAL LOW (ref 3.87–5.11)
RDW: 16.9 % — ABNORMAL HIGH (ref 11.5–15.5)
WBC: 7.5 10*3/uL (ref 4.0–10.5)
nRBC: 0 % (ref 0.0–0.2)
nRBC: 0 /100{WBCs}

## 2023-03-28 LAB — COMPREHENSIVE METABOLIC PANEL
ALT: 9 U/L (ref 0–44)
AST: 17 U/L (ref 15–41)
Albumin: 2.5 g/dL — ABNORMAL LOW (ref 3.5–5.0)
Alkaline Phosphatase: 52 U/L (ref 38–126)
Anion gap: 11 (ref 5–15)
BUN: 12 mg/dL (ref 8–23)
CO2: 24 mmol/L (ref 22–32)
Calcium: 8.8 mg/dL — ABNORMAL LOW (ref 8.9–10.3)
Chloride: 98 mmol/L (ref 98–111)
Creatinine, Ser: 4.69 mg/dL — ABNORMAL HIGH (ref 0.44–1.00)
GFR, Estimated: 10 mL/min — ABNORMAL LOW (ref >60)
Glucose, Bld: 89 mg/dL (ref 70–99)
Potassium: 3.4 mmol/L — ABNORMAL LOW (ref 3.5–5.1)
Sodium: 133 mmol/L — ABNORMAL LOW (ref 135–145)
Total Bilirubin: 0.6 mg/dL (ref 0.0–1.2)
Total Protein: 5.3 g/dL — ABNORMAL LOW (ref 6.5–8.1)

## 2023-03-28 LAB — CBG MONITORING, ED: Glucose-Capillary: 108 mg/dL — ABNORMAL HIGH (ref 70–99)

## 2023-03-28 LAB — PROTIME-INR
INR: 1.3 — ABNORMAL HIGH (ref 0.8–1.2)
Prothrombin Time: 16.8 s — ABNORMAL HIGH (ref 11.4–15.2)

## 2023-03-28 LAB — TSH: TSH: 11.4 u[IU]/mL — ABNORMAL HIGH (ref 0.350–4.500)

## 2023-03-28 LAB — CORTISOL-AM, BLOOD: Cortisol - AM: 1.3 ug/dL — ABNORMAL LOW (ref 6.7–22.6)

## 2023-03-28 MED ORDER — ONDANSETRON HCL 4 MG/2ML IJ SOLN: 4.0000 mg | Freq: Four times a day (QID) | INTRAMUSCULAR | Status: DC | PRN

## 2023-03-28 MED ORDER — MIDODRINE HCL 5 MG PO TABS
10.0000 mg | ORAL_TABLET | ORAL | Status: AC
  Administered 2023-03-29: 10 mg via ORAL
  Filled 2023-03-28: qty 2

## 2023-03-28 MED ORDER — ARFORMOTEROL TARTRATE 15 MCG/2ML IN NEBU
15.0000 ug | INHALATION_SOLUTION | Freq: Two times a day (BID) | RESPIRATORY_TRACT | Status: DC
  Administered 2023-03-29 – 2023-04-01 (×6): 15 ug via RESPIRATORY_TRACT
  Filled 2023-03-28 (×7): qty 2

## 2023-03-28 MED ORDER — SODIUM CHLORIDE 0.9 % IV SOLN: 250.0000 mL | INTRAVENOUS | Status: AC | PRN

## 2023-03-28 MED ORDER — SODIUM CHLORIDE 0.9 % IV BOLUS
500.0000 mL | Freq: Once | INTRAVENOUS | Status: AC
Start: 2023-03-28 — End: 2023-03-28
  Administered 2023-03-28: 500 mL via INTRAVENOUS

## 2023-03-28 MED ORDER — ACETAMINOPHEN 500 MG PO TABS: 1000.0000 mg | ORAL_TABLET | Freq: Every day | ORAL | Status: DC | PRN

## 2023-03-28 MED ORDER — CINACALCET HCL 30 MG PO TABS
60.0000 mg | ORAL_TABLET | ORAL | Status: DC
  Administered 2023-03-30: 60 mg via ORAL
  Filled 2023-03-28 (×2): qty 2

## 2023-03-28 MED ORDER — SODIUM CHLORIDE 0.9% FLUSH: 3.0000 mL | INTRAVENOUS | Status: DC | PRN

## 2023-03-28 MED ORDER — AMOXICILLIN 500 MG PO CAPS: 2000.0000 mg | ORAL_CAPSULE | ORAL | Status: DC

## 2023-03-28 MED ORDER — ALLOPURINOL 100 MG PO TABS
100.0000 mg | ORAL_TABLET | ORAL | Status: DC
  Administered 2023-03-30: 100 mg via ORAL
  Filled 2023-03-28 (×3): qty 1

## 2023-03-28 MED ORDER — ALLOPURINOL 100 MG PO TABS: 100.0000 mg | ORAL_TABLET | Freq: Every evening | ORAL | Status: DC

## 2023-03-28 MED ORDER — ALBUMIN HUMAN 25 % IV SOLN
50.0000 g | INTRAVENOUS | Status: AC
  Administered 2023-03-29: 12.5 g via INTRAVENOUS
  Filled 2023-03-28: qty 200

## 2023-03-28 MED ORDER — ALBUTEROL SULFATE HFA 108 (90 BASE) MCG/ACT IN AERS: 2.0000 | INHALATION_SPRAY | RESPIRATORY_TRACT | Status: DC

## 2023-03-28 MED ORDER — SODIUM CHLORIDE 0.9% FLUSH
3.0000 mL | Freq: Two times a day (BID) | INTRAVENOUS | Status: DC
  Administered 2023-03-29 – 2023-04-01 (×7): 3 mL via INTRAVENOUS

## 2023-03-28 MED ORDER — DEXTROSE 50 % IV SOLN: 1.0000 | INTRAVENOUS | Status: DC | PRN

## 2023-03-28 MED ORDER — UMECLIDINIUM BROMIDE 62.5 MCG/ACT IN AEPB
1.0000 | INHALATION_SPRAY | Freq: Every day | RESPIRATORY_TRACT | Status: DC
  Filled 2023-03-28 (×2): qty 7

## 2023-03-28 MED ORDER — SODIUM CHLORIDE 0.9% FLUSH
3.0000 mL | Freq: Two times a day (BID) | INTRAVENOUS | Status: DC
Start: 2023-03-29 — End: 2023-04-02
  Administered 2023-03-29 – 2023-04-01 (×6): 3 mL via INTRAVENOUS

## 2023-03-28 MED ORDER — HEPARIN SODIUM (PORCINE) 5000 UNIT/ML IJ SOLN: 5000.0000 [IU] | Freq: Three times a day (TID) | INTRAMUSCULAR | Status: DC

## 2023-03-28 MED ORDER — BUSPIRONE HCL 5 MG PO TABS
5.0000 mg | ORAL_TABLET | Freq: Three times a day (TID) | ORAL | Status: DC
  Administered 2023-03-29 – 2023-04-01 (×10): 5 mg via ORAL
  Filled 2023-03-28 (×10): qty 1

## 2023-03-28 MED ORDER — ACETAMINOPHEN 650 MG RE SUPP: 650.0000 mg | Freq: Four times a day (QID) | RECTAL | Status: DC | PRN

## 2023-03-28 MED ORDER — AMOXICILLIN 250 MG PO CAPS: 250.0000 mg | ORAL_CAPSULE | ORAL | Status: DC

## 2023-03-28 MED ORDER — ACETAMINOPHEN 325 MG PO TABS
650.0000 mg | ORAL_TABLET | Freq: Four times a day (QID) | ORAL | Status: DC | PRN
  Administered 2023-03-29 – 2023-04-01 (×3): 650 mg via ORAL
  Filled 2023-03-28 (×3): qty 2

## 2023-03-28 MED ORDER — PANTOPRAZOLE SODIUM 40 MG PO TBEC
40.0000 mg | DELAYED_RELEASE_TABLET | Freq: Two times a day (BID) | ORAL | Status: DC
  Administered 2023-03-29: 40 mg via ORAL
  Filled 2023-03-28: qty 1

## 2023-03-28 MED ORDER — ASPIRIN 81 MG PO CHEW: 81.0000 mg | CHEWABLE_TABLET | Freq: Every day | ORAL | Status: DC

## 2023-03-28 MED ORDER — ALBUTEROL SULFATE HFA 108 (90 BASE) MCG/ACT IN AERS
2.0000 | INHALATION_SPRAY | Freq: Once | RESPIRATORY_TRACT | Status: AC
  Administered 2023-03-28: 2 via RESPIRATORY_TRACT
  Filled 2023-03-28: qty 6.7

## 2023-03-28 MED ORDER — ONDANSETRON HCL 4 MG PO TABS
4.0000 mg | ORAL_TABLET | Freq: Four times a day (QID) | ORAL | Status: DC | PRN
Start: 2023-03-28 — End: 2023-04-02

## 2023-03-28 MED ORDER — CINACALCET HCL 30 MG PO TABS
90.0000 mg | ORAL_TABLET | ORAL | Status: DC
  Filled 2023-03-28: qty 3

## 2023-03-28 MED ORDER — ALBUTEROL SULFATE (2.5 MG/3ML) 0.083% IN NEBU: 2.5000 mg | INHALATION_SOLUTION | Freq: Four times a day (QID) | RESPIRATORY_TRACT | Status: DC | PRN

## 2023-03-28 MED ORDER — CINACALCET HCL 30 MG PO TABS: 60.0000 mg | ORAL_TABLET | ORAL | Status: DC

## 2023-03-28 MED ORDER — VITAMIN D 25 MCG (1000 UNIT) PO TABS
2000.0000 [IU] | ORAL_TABLET | Freq: Every day | ORAL | Status: DC
  Administered 2023-03-30 – 2023-04-01 (×3): 2000 [IU] via ORAL
  Filled 2023-03-28 (×3): qty 2

## 2023-03-28 MED ORDER — ACETAMINOPHEN 325 MG PO TABS
650.0000 mg | ORAL_TABLET | Freq: Four times a day (QID) | ORAL | Status: DC | PRN
  Administered 2023-03-28: 650 mg via ORAL
  Filled 2023-03-28: qty 2

## 2023-03-28 NOTE — Discharge Instructions (Addendum)
 Avoid taking ativan because of the sedation.  Keep dialysis appointment.  Use your inhaler

## 2023-03-28 NOTE — ED Notes (Signed)
 Attempted to walk pt and unsuccessful. She states that she was too weak to get out of the bed.

## 2023-03-28 NOTE — ED Triage Notes (Addendum)
 Pt to ED from home via EMS after daughter noticed pt was slurring her speech around 1700 yesterday. Pt's other daughter says she was last normal around 1100 yesterday. Today is fatigue/sluggish. BG in the field was 67. Given food and repeat was 127 & 118. Pt did have a witnessed fall by daughter. "Legs gave out". No thinners. No LOC. Denies hitting head. 2L [place] at baseline.  Dialysis MWF- did go yesterday & completed. Left arm limb alert  20g R AC

## 2023-03-28 NOTE — ED Provider Notes (Cosign Needed Addendum)
 [place] EMERGENCY DEPARTMENT AT [facility] Provider Note   CSN: [id number] Arrival date & time: 03/28/23  1541     History  Chief Complaint  Patient presents with   Fall   Stroke-like symptoms    Jodi Bell is a 63 y.o. female.  Patient complains of weakness.  EMS reports patient daughter was concerned yesterday that patient could be having a stroke.  Because she was weaker than usual.  Daughter reported noticing increasing weakness yesterday around 5:00.  Patient had dialysis yesterday.  She is frequently weak and tired after dialysis.  Patient denies any current complaints.  Patient denies any chest pain or shortness of breath.  Patient states that she took Ativan today.  Patient denies any fever or chills.  I spoke with patient's daughter who reports that patient had some slurred speech earlier today.  She states that patient has a history of taking Ativan for anxiety and that the patient has been more anxious because she was out of town.  Patient has been eating and drinking less.  The history is provided by the patient. No language interpreter was used.  Fall       Home Medications Prior to Admission medications   Medication Sig Start Date End Date Taking? Authorizing Provider  acetaminophen (TYLENOL) 500 MG tablet Take 1,000 mg by mouth daily as needed (pain).    [provider]  albuterol (PROVENTIL) (2.5 MG/3ML) 0.083% nebulizer solution Take 3 mLs (2.5 mg total) by nebulization every 6 (six) hours as needed for wheezing or shortness of breath. 07/10/21   Hunsucker, Lesia Sago, MD  allopurinol (ZYLOPRIM) 100 MG tablet Take 100 mg by mouth every evening.    [provider]  amoxicillin (AMOXIL) 500 MG capsule Take 4 capsules (2,000 mg total) by mouth as directed. Take 4 tablets 1 hour prior to dental work, including cleanings. 03/19/23   Georgie Chard D, NP  Artificial Tear Solution (TEARS NATURALE OP) Place 1 drop into both eyes as needed  (dry eye).    [provider]  aspirin 81 MG chewable tablet Chew 1 tablet (81 mg total) by mouth every other day. 01/07/23   Monna Fam, MD  busPIRone (BUSPAR) 5 MG tablet Take 5 mg by mouth 3 (three) times daily. 01/01/23 01/01/24  [provider]  carvedilol (COREG) 25 MG tablet Take 1 tablet (25 mg total) by mouth 2 (two) times daily with a meal. 12/25/22   Janetta Hora, PA-C  cetirizine (ZYRTEC) 10 MG tablet Take 10 mg by mouth daily as needed for allergies.    [provider]  Cholecalciferol (VITAMIN D) 50 MCG (2000 UT) tablet Take 2,000 Units by mouth daily.    [provider]  cinacalcet (SENSIPAR) 30 MG tablet Take 60 mg by mouth every Monday, Wednesday, and Friday with hemodialysis. Pt states she takes 2 tab on Mon, 3 tab on Wed, and 2 tab on Fri.    [provider]  cloNIDine (CATAPRES - DOSED IN MG/24 HR) 0.3 mg/24hr patch Place 0.3 mg onto the skin every Sunday.    [provider]  conjugated estrogens (PREMARIN) vaginal cream Place vaginally daily as needed. 01/07/23   Monna Fam, MD  diclofenac Sodium (VOLTAREN) 1 % GEL Apply 2 g topically daily as needed (pain).    [provider]  docusate sodium (COLACE) 100 MG capsule Take 100 mg by mouth daily as needed for moderate constipation.    [provider]  EPINEPHrine 0.3  mg/0.3 mL IJ SOAJ injection Inject into the muscle as needed for anaphylaxis. 07/12/21   [provider]  fluticasone (FLONASE) 50 MCG/ACT nasal spray Place 1 spray into both nostrils daily as needed for allergies or rhinitis.    [provider]  hydrocortisone 2.5%-nystatin-zinc oxide 20% 1:1:1 ointment mixture Apply topically daily as needed. 01/07/23   Monna Fam, MD  isosorbide mononitrate (IMDUR) 60 MG 24 hr tablet Take 1 tablet (60 mg total) by mouth daily. 10/27/22   Jake Bathe, MD  LORazepam (ATIVAN) 0.5 MG tablet Take 0.5 mg by mouth every 8 (eight) hours as  needed for anxiety.    [provider]  montelukast (SINGULAIR) 10 MG tablet Take 10 mg by mouth daily with supper.     [provider]  multivitamin (RENA-VIT) TABS tablet Take 1 tablet by mouth every Monday, Wednesday, and Friday.    [provider]  neomycin-polymyxin b-dexamethasone (MAXITROL) 3.5-10000-0.1 SUSP Place 1 drop into both eyes in the morning, at noon, in the evening, and at bedtime. Patient not taking: Reported on 01/27/2023 12/18/22   [provider]  NIFEdipine (PROCARDIA XL/NIFEDICAL XL) 60 MG 24 hr tablet Take 60 mg by mouth 2 (two) times daily. 10/01/21   [provider]  olmesartan (BENICAR) 40 MG tablet Take 40 mg by mouth every evening. 05/06/19   [provider]  ondansetron (ZOFRAN) 4 MG tablet Take 4 mg by mouth 2 (two) times daily as needed for nausea or vomiting.    [provider]  OXYGEN Inhale 2-3 L into the lungs continuous.    [provider]  pantoprazole (PROTONIX) 40 MG tablet Take 40 mg by mouth 2 (two) times daily. 06/09/20   [provider]  prednisoLONE acetate (PRED FORTE) 1 % ophthalmic suspension Place 1 drop into both eyes every 4 (four) hours. 12/31/22   [provider]  predniSONE (DELTASONE) 5 MG tablet Take 1 tablet (5 mg total) by mouth daily with breakfast. 02/27/22   Marrianne Mood, MD  Probiotic Product (PROBIOTIC PO) Take 1 capsule by mouth daily.    [provider]  silver sulfADIAZINE (SILVADENE) 1 % cream Apply 1 Application topically daily as needed (wound care).    [provider]  Tiotropium Bromide-Olodaterol (STIOLTO RESPIMAT) 2.5-2.5 MCG/ACT AERS Inhale 2 puffs once daily into the lungs. 11/28/21   Hunsucker, Lesia Sago, MD  VENTOLIN HFA 108 (90 Base) MCG/ACT inhaler Inhale 2 puffs into the lungs See admin instructions. Take 2 puffs every 4-6 hours as needed for wheezing and shortness of breath 04/08/22   Hunsucker, Lesia Sago, MD   zolpidem (AMBIEN) 5 MG tablet Take 5 mg by mouth at bedtime as needed for sleep. 02/04/21   [provider]      Allergies    3-methyl-2-benzothiazolinone hydrazone, Banana, Black walnut flavoring agent (non-screening), Hazelnut (filbert), Leflunomide and related, Lisinopril, No healthtouch food allergies, Other, Pecan nut (diagnostic), Trazodone and nefazodone, Adalimumab, Escitalopram oxalate, Ezetimibe, Secukinumab, Statins, Ferrlecit [na ferric gluc cplx in sucrose], Gabapentin, and Nsaids    Review of Systems   Review of Systems  All other systems reviewed and are negative.   Physical Exam Updated Vital Signs BP (!) 93/44 (BP Location: Right Arm)   Pulse 62   Temp 99.1 F (37.3 C) (Oral)   Resp 16   Ht 5\' 2"  (1.575 m)   Wt 58 kg   SpO2 93%   BMI 23.39 kg/m  Physical Exam Vitals and nursing note reviewed.  Constitutional:      Appearance: She is well-developed.  HENT:     Head: Normocephalic.     Right Ear: Tympanic membrane normal.     Left Ear: Tympanic membrane normal.     Mouth/Throat:     Mouth: Mucous membranes are moist.  Eyes:     Pupils: Pupils are equal, round, and reactive to light.  Cardiovascular:     Rate and Rhythm: Normal rate.  Pulmonary:     Effort: Pulmonary effort is normal.  Abdominal:     General: There is no distension.  Musculoskeletal:        General: Normal range of motion.     Cervical back: Normal range of motion.     Comments: Patient moves all extremities.  Skin:    General: Skin is warm.  Neurological:     General: No focal deficit present.     Mental Status: She is alert and oriented to person, place, and time.     Comments: No facial weakness  Psychiatric:        Mood and Affect: Mood normal.     ED Results / Procedures / Treatments   Labs (all labs ordered are listed, but only abnormal results are displayed) Labs Reviewed  CBC WITH DIFFERENTIAL/PLATELET  COMPREHENSIVE METABOLIC PANEL  PROTIME-INR     EKG None  Radiology No results found.  Procedures Procedures    Medications Ordered in ED Medications - No data to display  ED Course/ Medical Decision Making/ A&P                                 Medical Decision Making Patient denies any current complaints.  She states that she is sleepy and that she thinks that she took a Ativan before coming in.  Amount and/or Complexity of Data Reviewed Independent Historian: EMS    Details: EMS provides history that patient has been weaker since dialysis yesterday.  EMS reports patient's blood sugar was low.  Patient was given food and blood sugar improved.  EMS reports patient did have a fall.  Patient denies any area of injuries. Labs: ordered. Decision-making details documented in ED Course.    Details: Labs ordered reviewed and interpreted.  Patient's hemoglobin is 7.5.  This is at her baseline.  Patient's potassium is normal. Radiology: ordered and independent interpretation performed. Decision-making details documented in ED Course.    Details: CT head shows no acute changes Chest x-ray no acute findings ECG/medicine tests: ordered and independent interpretation performed. Decision-making details documented in ED Course.  Risk OTC drugs. Prescription drug management. Risk Details: Patient has become increasingly more alert and interactive since being in the emergency department.  I advised patient's daughter to have her avoid taking Ativan.        Pt does not feel safe to go home.  Pt reports she still feels like she can not walk.  Pt tells me that she thinks she needs to have dialysis.  Pt's blood pressure is still slightly low.  Pt does not feel like she will be safe to go home.   I will consult hospitalist for observation.     Final Clinical Impression(s) / ED Diagnoses Final diagnoses:  Confusion  Hypotension, unspecified hypotension type    Rx / DC Orders ED Discharge Orders     None      An After Visit  Summary was printed and given to the patient.  Elson Areas, PA-C 03/28/23 2050    Elson Areas, PA-C 03/28/23 2106    Osie Cheeks 03/28/23 2145    Terrilee Files, MD 03/29/23 [phone]

## 2023-03-28 NOTE — ED Notes (Signed)
 Pt ambulated with this RN, PA, and ED tech.

## 2023-03-28 NOTE — H&P (Addendum)
 Status History and Physical    Jodi Bell:[id number] DOB: 1960/07/02 DOA: 03/28/2023  PCP: Genelle Gather, MD   Patient coming from: Home   Chief Complaint:  Chief Complaint  Patient presents with   Fall   Stroke-like symptoms   ED TRIAGE note:  Pt to ED from home via EMS after daughter noticed pt was slurring her speech around 1700 yesterday. Pt's other daughter says she was last normal around 1100 yesterday. Today is fatigue/sluggish. BG in the field was 67. Given food and repeat was 127 & 118. Pt did have a witnessed fall by daughter. "Legs gave out". No thinners. No LOC. Denies hitting head. 2L [place] at baseline.  Dialysis MWF- did go yesterday & completed. Left arm limb alert    HPI:  Jodi Bell is a 63 y.o. female with medical history significant of history of CAD status post CABG x 2 vessel, ESRD on dialysis MWF schedule, chronic hypoxic respiratory failure 2 L oxygen baseline, diet controlled DM type II, essential hypertension, chronic thrombocytopenia, aortic stenosis status post TAVR 12/2022, left bundle branch block, generalized anxiety disorder, history of recurrent GI bleed, anemia of chronic disease-initial hypertension presented to emergency department as patient's daughter reported that she had slurred speech started around 5 PM 03/28/2023.  Last known well 11 AM yesterday 03/27/2023.  Patient also reporting her lips are giving away.  Patient also found hypoglycemic 67 by EMS.  Patient also has a mechanical fall witnessed by patient's daughter as patient was reporting her knees have been giving away.  Patient's daughter also reported that patient has been taking Ativan and which makes her drowsy and having repeated fall. Patient home by herself.  It is seems like that patient has been taking Ativan which is causing this drowsiness and all. Patient does not have any neurological deficit on physical exam.  Initial plan was to discharge patient from the emergency  department however as patient remained hypotensive and drowsy hospitalist has been requested to admit patient for observation for the management of hypotension, mechanical fall and acute metabolic encephalopathy in the setting of Ativan use.   At presentation to ED patient is hypotensive blood pressure 93/44, O2 sat 93% to 100% on 3 L oxygen, heart rate 63 and respiratory rate within normal range. CBC showing stable H&H 7.5 and 26, WBC count 7.5 and low platelet 110. CMP showing low potassium 3.4, low sodium 133, low albumin 2.5. Chronically elevated pro time INR.  CT head no acute intracranial abnormality.Chronic mild proptosis bilaterally.  Chest x-ray cardiomegaly with pulmonary vascular congestion.  Aortic atherosclerosis.  Significant labs in the ED: Lab Orders         CBC with Differential         Comprehensive metabolic panel         Protime-INR         Comprehensive metabolic panel         CBC         TSH         Cortisol-am, blood         CBG monitoring, ED       Review of Systems:  Review of Systems  Constitutional:  Negative for chills, fever, malaise/fatigue and weight loss.  Eyes:  Negative for blurred vision.  Respiratory:  Negative for cough, hemoptysis, sputum production and shortness of breath.   Cardiovascular:  Negative for chest pain, palpitations, claudication and leg swelling.  Gastrointestinal:  Negative for abdominal pain, blood in  stool, constipation, diarrhea, heartburn, nausea and vomiting.  Musculoskeletal:  Negative for back pain, falls, joint pain, myalgias and neck pain.       Reported knee locking and giving away sensation.  Neurological:  Negative for dizziness, tingling, tremors, sensory change, speech change, focal weakness, seizures, loss of consciousness, weakness and headaches.  Endo/Heme/Allergies:  Does not bruise/bleed easily.  Psychiatric/Behavioral:  The patient is not nervous/anxious.   All other systems reviewed and are  negative.   Past Medical History:  Diagnosis Date   Anemia of chronic disease    Anxiety    Arthritis    oa and psoriatic ra   Asthma    Back pain, chronic    lower back   Chronic insomnia    COPD (chronic obstructive pulmonary disease) (HCC)    Diabetes mellitus    type 2   ESRD on dialysis [facility])    Essential hypertension    GERD (gastroesophageal reflux disease)    Gout    History of hiatal hernia    Hypoalbuminemia    Hyponatremia    Mild aortic stenosis    Mitral regurgitation    Mitral stenosis    Obesity    PAD (peripheral artery disease) (HCC)    a. externial iliac calcification seen on CT 04/2020.   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 Lynnette Caffey & Laneta Simmers.   Sleep apnea    Tobacco abuse    Upper GI bleed 05/2019    Past Surgical History:  Procedure Laterality Date   BACK SURGERY  2016   lower back fusion with cage   BIOPSY  09/23/2017   Procedure: BIOPSY;  Surgeon: Graylin Shiver, MD;  Location: [facility] ENDOSCOPY;  Service: Endoscopy;;   BIOPSY  05/19/2019   Procedure: BIOPSY;  Surgeon: Kathi Der, MD;  Location: [facility] ENDOS[facility];  Service: Gastroenterology;;   CESAREAN SECTION  1990   x 1    COLONOSCOPY WITH PROPOFOL N/A 09/23/2017   Procedure: COLONOSCOPY WITH PROPOFOL Hemostatic clips placed;  Surgeon: Graylin Shiver, MD;  Location: [facility]OS[facility];  Service: Endoscopy;  Laterality: N/A;   COLONOSCOPY WITH PROPOFOL N/A 10/02/2020   Procedure: COLONOSCOPY WITH PROPOFOL;  Surgeon: Willis Modena, MD;  Location: [facility] ENDOSCOPY;  Service: Endoscopy;  Laterality: N/A;   CORONARY ARTERY BYPASS GRAFT N/A 09/13/2020   Procedure: CORONARY ARTERY BYPASS GRAFTING (CABG), ON PUMP, TIMES TWO, USING LEFT INTERNAL MAMMARY ARTERY AND RIGHT ENDOSCOPICALLY HARVESTED GREATER SAPHENOUS VEIN;  Surgeon: Alleen Borne, MD;  Location: [facility] OR;  Service: Open Heart Surgery;  Laterality: N/A;   DILATION AND  CURETTAGE OF UTERUS  1988   ENTEROSCOPY N/A 07/22/2021   Procedure: ENTEROSCOPY;  Surgeon: Kerin Salen, MD;  Location: [facility] ENDOSCOPY;  Service: Gastroenterology;  Laterality: N/A;   ENTEROSCOPY N/A 01/28/2023   Procedure: ENTEROSCOPY;  Surgeon: [name], MD;  Location: [facility] ENDOSCOPY;  Service: Gastroenterology;  Laterality: N/A;   ESOPHAGOGASTRODUODENOSCOPY N/A 09/22/2020   Procedure: ESOPHAGOGASTRODUODENOSCOPY (EGD);  Surgeon: Willis Modena, MD;  Location: [facility] ENDOSCOPY;  Service: Endoscopy;  Laterality: N/A;   ESOPHAGOGASTRODUODENOSCOPY (EGD) WITH PROPOFOL N/A 09/23/2017   Procedure: ESOPHAGOGASTRODUODENOSCOPY (EGD) WITH PROPOFOL;  Surgeon: Graylin Shiver, MD;  Location: [facility];  Service: Endoscopy;  Laterality: N/A;   ESOPHAGOGASTRODUODENOSCOPY (EGD) WITH PROPOFOL N/A 05/19/2019   Procedure: ESOPHAGOGASTRODUODENOSCOPY (EGD) WITH PROPOFOL;  Surgeon: Kathi Der, MD;  Location: [facility];  Service: Gastroenterology;  Laterality: N/A;   ESOPHAGOGASTRODUODENOSCOPY (EGD)  WITH PROPOFOL N/A 10/02/2020   Procedure: ESOPHAGOGASTRODUODENOSCOPY (EGD) WITH PROPOFOL;  Surgeon: Willis Modena, MD;  Location: [facility] ENDOSCOPY;  Service: Endoscopy;  Laterality: N/A;   GIVENS CAPSULE STUDY N/A 05/19/2019   Procedure: GIVENS CAPSULE STUDY;  Surgeon: Kathi Der, MD;  Location: [facility] ENDOS[facility];  Service: Gastroenterology;  Laterality: N/A;   GIVENS CAPSULE STUDY N/A 07/19/2021   Procedure: GIVENS CAPSULE STUDY;  Surgeon: [name], MD;  Location: [facility] ENDOSCOPY;  Service: Gastroenterology;  Laterality: N/A;   HEMOSTASIS CLIP PLACEMENT  09/22/2020   Procedure: HEMOSTASIS CLIP PLACEMENT;  Surgeon: Willis Modena, MD;  [facility]: [facility];  Service: Endoscopy;;   HEMOSTASIS CONTROL  09/22/2020   Procedure: HEMOSTASIS CONTROL;  Surgeon: Willis Modena, MD;  Location: [facility] ENDOSCOPY;  Service: Endoscopy;;   HOT HEMOSTASIS N/A 05/19/2019   Procedure: HOT HEMOSTASIS (ARGON PLASMA COAGULATION/BICAP);  Surgeon:  Kathi Der, MD;  Location: [facility] ENDOSCOPY;  Service: Gastroenterology;  Laterality: N/A;   HOT HEMOSTASIS N/A 01/28/2023   Procedure: HOT HEMOSTASIS (ARGON PLASMA COAGULATION/BICAP);  Surgeon: [name], MD;  Location: [facility] ENDOSCOPY;  Service: Gastroenterology;  Laterality: N/A;   INTRAOPERATIVE TRANSTHORACIC ECHOCARDIOGRAM N/A 12/23/2022   Procedure: INTRAOPERATIVE TRANSTHORACIC ECHOCARDIOGRAM;  Surgeon: [name], MD;  [facility] INVASIVE CV LAB;  Service: Cardiovascular;  Laterality: N/A;   LEFT HEART CATH AND CORONARY ANGIOGRAPHY N/A 09/10/2020   Procedure: LEFT HEART CATH AND CORONARY ANGIOGRAPHY;  Surgeon: Yvonne Kendall, MD;  [facility]: [facility] INVASIVE CV LAB;  Service: Cardiovascular;  Laterality: N/A;   PARTIAL KNEE ARTHROPLASTY Left 11/12/2017   Procedure: left unicompartmental arthroplasty-medial;  Surgeon: Durene Romans, MD;  Location: [facility] ORS;  Service: Orthopedics;  Laterality: Left;    RIGHT HEART CATH AND CORONARY/GRAFT ANGIOGRAPHY N/A 06/26/2022   Procedure: RIGHT HEART CATH AND CORONARY/GRAFT ANGIOGRAPHY;  Surgeon: [name], MD;  [facility]: [facility] INVASIVE CV LAB;  Service: Cardiovascular;  Laterality: N/A;   SUBMUCOSAL INJECTION  09/23/2017   Procedure: SUBMUCOSAL INJECTION;  Surgeon: Graylin Shiver, MD;  Location: [facility] ENDOSCOPY;  Service: Endoscopy;;  in colon   TEE WITHOUT CARDIOVERSION N/A 09/13/2020   Procedure: TRANSESOPHAGEAL ECHOCARDIOGRAM (TEE);  Surgeon: Alleen Borne, MD;  Location: [facility] OR;  Service: Open Heart Surgery;  Laterality: N/A;   TUBAL LIGATION  1990     reports that she quit smoking about 2 years ago. Her smoking use included cigarettes. She started smoking about 42 years ago. She has a 20 pack-year smoking history. She has never used smokeless tobacco. She reports current alcohol use. She reports that she does not use drugs.  Allergies  Allergen Reactions   3-Methyl-2-Benzothiazolinone Hydrazone Other (See Comments)    Muscle  weakness muscle cramping in legs Other reaction(s): Myalgias (Muscle Pain)   Banana Anaphylaxis, Swelling and Other (See Comments)    TONGUE AND MOUTH SWELLING   Black Buyer, retail (Non-Screening) Anaphylaxis and Itching    Walnuts   Hazelnut ([place]) Anaphylaxis    Hazelnuts   Leflunomide And Related Other (See Comments)    Severe Headache   Lisinopril Swelling    Angioedema  Swelling of the face   No [employer]ht[employer] Food Allergies Anaphylaxis    Spicy Mustard 4.7.2021 Pt reports that she eats Regular Yellow Mustard Swelling and itching of tongue   Other Anaphylaxis, Swelling and Other (See Comments)    Cosentyx= angioedema Mouth itches and tongue swelling Hazel nuts and pecans also Walnuts Renal failure Serotonin syndrome  Tremors Muscle weakness muscle cramping in legs Other reaction(s): Myalgias (Muscle Pain)  Pecan Nut (Diagnostic) Anaphylaxis and Itching    Pecans   Trazodone And Nefazodone Other (See Comments)    Serotonin Syndrome  Tremors   Adalimumab Other (See Comments)    Blisters on abdomen =humira   Escitalopram Oxalate Other (See Comments)    Hand problems - Serotonin Syndrome     Ezetimibe Other (See Comments)    myalgias cramps    Secukinumab Swelling    Cosentyx = angiodema    Statins Other (See Comments)    Leg pains and weakness   Ferrlecit [Na Ferric Gluc Cplx In Sucrose]     Not reaction given from HD   Gabapentin Other (See Comments)    tremors   Nsaids     Avoid due to renal problems     Family History  Problem Relation Age of Onset   Lung cancer Mother    Diabetes Sister    Heart disease Sister    Asthma Daughter    Arthritis Daughter    Arthritis Daughter    Thyroid cancer Other        1/2 sister   Heart disease Other        1/2 sister    Prior to Admission medications   Medication Sig Start Date End Date Taking? Authorizing Provider  acetaminophen (TYLENOL) 500 MG tablet Take 1,000 mg by mouth daily as needed  (pain).    [provider]  albuterol (PROVENTIL) (2.5 MG/3ML) 0.083% nebulizer solution Take 3 mLs (2.5 mg total) by nebulization every 6 (six) hours as needed for wheezing or shortness of breath. 07/10/21   Hunsucker, Lesia Sago, MD  allopurinol (ZYLOPRIM) 100 MG tablet Take 100 mg by mouth every evening.    [provider]  amoxicillin (AMOXIL) 500 MG capsule Take 4 capsules (2,000 mg total) by mouth as directed. Take 4 tablets 1 hour prior to dental work, including cleanings. 03/19/23   Georgie Chard D, NP  Artificial Tear Solution (TEARS NATURALE OP) Place 1 drop into both eyes as needed (dry eye).    [provider]  aspirin 81 MG chewable tablet Chew 1 tablet (81 mg total) by mouth every other day. 01/07/23   Monna Fam, MD  busPIRone (BUSPAR) 5 MG tablet Take 5 mg by mouth 3 (three) times daily. 01/01/23 01/01/24  [provider]  carvedilol (COREG) 25 MG tablet Take 1 tablet (25 mg total) by mouth 2 (two) times daily with a meal. 12/25/22   Janetta Hora, PA-C  cetirizine (ZYRTEC) 10 MG tablet Take 10 mg by mouth daily as needed for allergies.    [provider]  Cholecalciferol (VITAMIN D) 50 MCG (2000 UT) tablet Take 2,000 Units by mouth daily.    [provider]  cinacalcet (SENSIPAR) 30 MG tablet Take 60 mg by mouth every Monday, Wednesday, and Friday with hemodialysis. Pt states she takes 2 tab on Mon, 3 tab on Wed, and 2 tab on Fri.    [provider]  cloNIDine (CATAPRES - DOSED IN MG/24 HR) 0.3 mg/24hr patch Place 0.3 mg onto the skin every Sunday.    [provider]  conjugated estrogens (PREMARIN) vaginal cream Place vaginally daily as needed. 01/07/23   Monna Fam, MD  diclofenac Sodium (VOLTAREN) 1 % GEL Apply 2 g topically daily as needed (pain).    [provider]  docusate sodium (COLACE) 100 MG capsule Take 100 mg by mouth daily as needed for moderate constipation.    [provider]  EPINEPHrine  0.3 mg/0.3 mL IJ SOAJ injection Inject into the muscle as needed for anaphylaxis. 07/12/21   [provider]  fluticasone (FLONASE) 50 MCG/ACT nasal spray Place 1 spray into both nostrils daily as needed for allergies or rhinitis.    [provider]  hydrocortisone 2.5%-nystatin-zinc oxide 20% 1:1:1 ointment mixture Apply topically daily as needed. 01/07/23   Monna Fam, MD  isosorbide mononitrate (IMDUR) 60 MG 24 hr tablet Take 1 tablet (60 mg total) by mouth daily. 10/27/22   Jake Bathe, MD  LORazepam (ATIVAN) 0.5 MG tablet Take 0.5 mg by mouth every 8 (eight) hours as needed for anxiety.    [provider]  montelukast (SINGULAIR) 10 MG tablet Take 10 mg by mouth daily with supper.     [provider]  multivitamin (RENA-VIT) TABS tablet Take 1 tablet by mouth every Monday, Wednesday, and Friday.    [provider]  neomycin-polymyxin b-dexamethasone (MAXITROL) 3.5-10000-0.1 SUSP Place 1 drop into both eyes in the morning, at noon, in the evening, and at bedtime. Patient not taking: Reported on 01/27/2023 12/18/22   [provider]  NIFEdipine (PROCARDIA XL/NIFEDICAL XL) 60 MG 24 hr tablet Take 60 mg by mouth 2 (two) times daily. 10/01/21   [provider]  olmesartan (BENICAR) 40 MG tablet Take 40 mg by mouth every evening. 05/06/19   [provider]  ondansetron (ZOFRAN) 4 MG tablet Take 4 mg by mouth 2 (two) times daily as needed for nausea or vomiting.    [provider]  OXYGEN Inhale 2-3 L into the lungs continuous.    [provider]  pantoprazole (PROTONIX) 40 MG tablet Take 40 mg by mouth 2 (two) times daily. 06/09/20   [provider]  prednisoLONE acetate (PRED FORTE) 1 % ophthalmic suspension Place 1 drop into both eyes every 4 (four) hours. 12/31/22   [provider]  predniSONE (DELTASONE) 5 MG tablet Take 1 tablet (5 mg total) by mouth daily with breakfast.  02/27/22   Marrianne Mood, MD  Probiotic Product (PROBIOTIC PO) Take 1 capsule by mouth daily.    [provider]  silver sulfADIAZINE (SILVADENE) 1 % cream Apply 1 Application topically daily as needed (wound care).    [provider]  Tiotropium Bromide-Olodaterol (STIOLTO RESPIMAT) 2.5-2.5 MCG/ACT AERS Inhale 2 puffs once daily into the lungs. 11/28/21   Hunsucker, Lesia Sago, MD  VENTOLIN HFA 108 (90 Base) MCG/ACT inhaler Inhale 2 puffs into the lungs See admin instructions. Take 2 puffs every 4-6 hours as needed for wheezing and shortness of breath 04/08/22   Hunsucker, Lesia Sago, MD  zolpidem (AMBIEN) 5 MG tablet Take 5 mg by mouth at bedtime as needed for sleep. 02/04/21   [provider]     Physical Exam: Vitals:   03/28/23 2050 03/28/23 2100 03/28/23 2230 03/28/23 2245  BP: (!) 93/50 (!) 100/52 (!) 101/50 (!) 106/59  Pulse: 63 61 61 (!) 59  Resp: 14 (!) 21 10 15   Temp:      TempSrc:      SpO2: 100% 100% 100% 100%  Weight:      Height:        Physical Exam HENT:     Mouth/Throat:     Mouth: Mucous membranes are moist.  Eyes:     Pupils: Pupils are equal, round, and reactive to light.  Cardiovascular:     Rate and Rhythm: Normal rate and regular rhythm.     Pulses: Normal pulses.  Heart sounds: Normal heart sounds.  Pulmonary:     Effort: Pulmonary effort is normal.     Breath sounds: Normal breath sounds.  Abdominal:     General: There is distension.     Palpations: Abdomen is soft.     Tenderness: There is no abdominal tenderness. There is no guarding.  Musculoskeletal:        General: No swelling, tenderness, deformity or signs of injury.     Cervical back: Neck supple.     Right lower leg: No edema.     Left lower leg: No edema.  Neurological:     Mental Status: She is alert and oriented to person, place, and time.     Cranial Nerves: No cranial nerve deficit.     Sensory: No sensory deficit.     Motor: No weakness.      Coordination: Coordination normal.     Gait: Gait normal.     Deep Tendon Reflexes: Reflexes normal.  Psychiatric:        Mood and Affect: Mood normal.        Behavior: Behavior normal.        Thought Content: Thought content normal.        Judgment: Judgment normal.      Labs on Admission: I have personally reviewed following labs and imaging studies  CBC: Recent Labs  Lab 03/28/23 1745  WBC 7.5  NEUTROABS 5.4  HGB 7.5*  HCT 26.8*  MCV 117.0*  PLT 110*   Basic Metabolic Panel: Recent Labs  Lab 03/28/23 1745  NA 133*  K 3.4*  CL 98  CO2 24  GLUCOSE 89  BUN 12  CREATININE 4.69*  CALCIUM 8.8*   GFR: Estimated Creatinine Clearance: 9.8 mL/min (A) (by C-G formula based on SCr of 4.69 mg/dL (H)). Liver Function Tests: Recent Labs  Lab 03/28/23 1745  AST 17  ALT 9  ALKPHOS 52  BILITOT 0.6  PROT 5.3*  ALBUMIN 2.5*   No results for input(s): "LIPASE", "AMYLASE" in the last 168 hours. No results for input(s): "AMMONIA" in the last 168 hours. Coagulation Profile: Recent Labs  Lab 03/28/23 1745  INR 1.3*   Cardiac Enzymes: No results for input(s): "CKTOTAL", "CKMB", "CKMBINDEX", "TROPONINI", "TROPONINIHS" in the last 168 hours. BNP (last 3 results) No results for input(s): "BNP" in the last 8760 hours. HbA1C: No results for input(s): "HGBA1C" in the last 72 hours. CBG: Recent Labs  Lab 03/28/23 2240  GLUCAP 108*   Lipid Profile: No results for input(s): "CHOL", "HDL", "LDLCALC", "TRIG", "CHOLHDL", "LDLDIRECT" in the last 72 hours. Thyroid Function Tests: No results for input(s): "TSH", "T4TOTAL", "FREET4", "T3FREE", "THYROIDAB" in the last 72 hours. Anemia Panel: No results for input(s): "VITAMINB12", "FOLATE", "FERRITIN", "TIBC", "IRON", "RETICCTPCT" in the last 72 hours. Urine analysis:    Component Value Date/Time   COLORURINE YELLOW 09/12/2020 2333   APPEARANCEUR HAZY (A) 09/12/2020 2333   LABSPEC 1.012 09/12/2020 2333   PHURINE 8.0  09/12/2020 2333   GLUCOSEU NEGATIVE 09/12/2020 2333   HGBUR SMALL (A) 09/12/2020 2333   BILIRUBINUR NEGATIVE 09/12/2020 2333   KETONESUR NEGATIVE 09/12/2020 2333   PROTEINUR 100 (A) 09/12/2020 2333   UROBILINOGEN 0.2 09/05/2014 0013   NITRITE NEGATIVE 09/12/2020 2333   LEUKOCYTESUR NEGATIVE 09/12/2020 2333    Radiological Exams on Admission: I have personally reviewed images DG Chest Port 1 View Result Date: 03/28/2023 CLINICAL DATA:  weakness EXAM: PORTABLE CHEST 1 VIEW COMPARISON:  Chest x-ray 01/02/2023, CT cardiac 03/10/2023 FINDINGS:  Persistent cardiomegaly. The heart and mediastinal contours are unchanged. Atherosclerotic plaque. Aortic valve replacement. Coronary artery calcification. Mitral annular calcification. Prominent hilar vasculature. No focal consolidation. No over pulmonary edema. No pleural effusion. No pneumothorax. No acute osseous abnormality.  Intact sternotomy wires noted. IMPRESSION: 1. Cardiomegaly with pulmonary venous congestion. 2. Aortic Atherosclerosis (ICD10-I70.0) including coronary artery and mitral annular calcification. Electronically Signed   By: Tish Frederickson M.D.   On: 03/28/2023 17:17   CT Head Wo Contrast Result Date: 03/28/2023 CLINICAL DATA:  Facial paralysis/weakness (CN 7) possible cva not acute EXAM: CT HEAD WITHOUT CONTRAST TECHNIQUE: Contiguous axial images were obtained from the base of the skull through the vertex without intravenous contrast. RADIATION DOSE REDUCTION: This exam was performed according to the departmental dose-optimization program which includes automated exposure control, adjustment of the mA and/or kV according to patient size and/or use of iterative reconstruction technique. COMPARISON:  CT head 01/02/2023, MRI head 05/02/2021 FINDINGS: Brain: Patchy and confluent areas of decreased attenuation are noted throughout the deep and periventricular white matter of the cerebral hemispheres bilaterally, compatible with chronic  microvascular ischemic disease. No evidence of large-territorial acute infarction. No parenchymal hemorrhage. No mass lesion. No extra-axial collection. No mass effect or midline shift. No hydrocephalus. Basilar cisterns are patent. Vascular: No hyperdense vessel. Atherosclerotic calcifications are present within the cavernous internal carotid arteries. Skull: No acute fracture or focal lesion. Sinuses/Orbits: Paranasal sinuses and mastoid air cells are clear. Chronic mild proptosis bilaterally. Bilateral lens replacement. Otherwise the orbits are unremarkable. Other: None. IMPRESSION: 1. No acute intracranial abnormality. 2. Chronic mild proptosis bilaterally. Consider ophthalmology consultation. Electronically Signed   By: Tish Frederickson M.D.   On: 03/28/2023 17:05     EKG: My personal interpretation of EKG shows:  EKG showing normal sinus rhythm heart rate 60.  Prolonged PR interval and left bundle branch block.    Assessment/Plan: Principal Problem:   Acute metabolic encephalopathy Active Problems:   Hypotension   Fall at home, initial encounter   Non-insulin dependent type 2 diabetes mellitus (HCC)   Essential hypertension   Reactive airway disease   Chronic hypoxic respiratory failure (HCC)   History of transcatheter aortic valve replacement (TAVR)   Anemia of chronic disease   History of CAD status post CABG (coronary artery disease)   ESRD (end stage renal disease) (HCC)   Chronic idiopathic thrombocytopenia (HCC)   Generalized anxiety disorder   Hypoglycemia    Assessment and Plan: Acute metabolic encephalopathy Mechanical fall at home secondary to polypharmacy, hypotension and hypoglycemia. Acute metabolic encephalopathy secondary to polypharmacy, hypotensiona and hypoglycemia -Patient presenting with drowsiness, mechanical fall at home, complaining about knees are giving away. -Sickle exam no neurological deficit. - CT head unremarkable for any stroke - Further workup  in the ED showed patient has hypoglycemia blood close 67, low blood pressure 93/44 as well as patient has been taking Ativan 0.5 mg at home. - Concern for development of acute metabolic encephalopathy in the setting of 5 different blood pressure regimen, hypoglycemia, hypotension, and side effect of Ativan as well - Holding all blood pressure regimen - Continue check POC blood glucose every 4 hours. - Currently patient is alert oriented x 4. - Continue treat hypotension as mentioned below. - Consulted inpatient PT and OT for evaluation of balance and any DME requirement. -Counseled patient at the bedside to avoid taking Ativan unless it is at bedtime. - Continue fall precaution. - Continue cardia monitoring overnight.  Essential hypertension Hypotension - At home patient  is on 5 blood pressure regimen include Coreg 25 mg twice daily, clonidine 0.3 mg every week, Imdur 60 mg daily, Procardia XL 60 mg daily and Benicar 40 mg daily - Since patient presentation in the ED MAP between 60-63. - Holding any blood pressure regimen in the setting of hypotension.  There is a concern for infection which is provoking hypotension. - Concern for development of hypotension in the setting of polypharmacy - In the ED patient has been given 500 mL of NS bolus.  Holding further IV fluid resuscitation given ESRD patient on dialysis.  Blood pressure is still borderline soft 93/50.  Giving 50 g of albumin and midodrine 10 mg. -Once blood pressure will be improved can resume oral blood pressure regimen accordingly.  Non-insulin-dependent DM type II Hypoglycemia-resolved EMS found patient blood count 67 which has been improved to 89. - Continue heart healthy carb modified diet -Check POC blood glucose every 4 hours.  And D50 as needed on board.  Reactive airway disease Chronic hypoxic respiratory failure 2 to 3 L oxygen to baseline -O2 sat 100% on 2 L oxygen now. - Continue Brovana, incure Ellipta and albuterol  nebulizer as needed.  ESRD on dialysis MWF schedule -ESRD on dialysis last dialysis was on Friday, 03/27/2023 - Chest x-ray showing cardiomegaly pulmonary vascular congestion - CMP showing normal BUN 12 and evidence of ESRD creatinine 4.69 which is around baseline. - There is no urgent need for dialysis tonight. - Please consult nephrology in the daytime for dialysis resumption.  History of CAD status post CABG -Continue aspirin 81 mg daily. -History of intolerance to statin. -Holding Coreg in the setting of hypotension.  Will resume once appropriate.   History of left bundle branch block -EKG showing left bundle branch block.  Denies any symptoms.  Patient is following with cardiology outpatient.  Aortic stenosis status post TAVR Diastolic heart failure preserved EF 60 to 65% -Holding home blood pressure regimen in the setting of hypotension.  Anemia of chronic disease -Stable H&H 7.5 and 26.  Continue to monitor  Chronic thrombocytopenia -Platelet count 110 at  baseline.  Continue to monitor.  Generalized anxiety disorder - Continue BuSpar 5 mg daily - Holding Ativan in the setting of fall and acute metabolic encephalopathy.  Gout - Continue allopurinol 100 mg on Monday, Wednesday Friday dialysis schedule.  DVT prophylaxis:  SQ Heparin Code Status:  Full Code Diet: Renal diet. Family Communication:   Family was present at bedside, at the time of interview. Opportunity was given to ask question and all questions were answered satisfactorily.  Disposition Plan: plan to discharge home next 1 to 2 days. Consults: PT and OT Admission status:   Observation, Telemetry bed  Severity of Illness: The appropriate patient status for this patient is OBSERVATION. Observation status is judged to be reasonable and necessary in order to provide the required intensity of service to ensure the patient's safety. The patient's presenting symptoms, physical exam findings, and initial  radiographic and laboratory data in the context of their medical condition is felt to place them at decreased risk for further clinical deterioration. Furthermore, it is anticipated that the patient will be medically stable for discharge from the hospital within 2 midnights of admission.     Tereasa Coop, MD [facility]  How to contact the [facility] Attending or Consulting provider 7A - 7P or covering provider during after hours 7P -7A, for this patient.  Check the care team in [facility] and look for a) attending/consulting [facility] provider  listed and b) the [facility] team listed Log into [email or web address] and use [place]'s universal password to access. If you do not have the password, please contact the hospital operator. Locate the [facility] provider you are looking for under [facility] and page to a number that you can be directly reached. If you still have difficulty reaching the provider, please page the [facility] (Director on Call) for the Hospitalists listed on amion for assistance.  03/28/2023, 11:30 PM

## 2023-03-29 ENCOUNTER — Encounter (HOSPITAL_COMMUNITY): Payer: Self-pay | Admitting: Internal Medicine

## 2023-03-29 ENCOUNTER — Other Ambulatory Visit: Payer: Self-pay

## 2023-03-29 DIAGNOSIS — M109 Gout, unspecified: Secondary | ICD-10-CM | POA: Diagnosis present

## 2023-03-29 DIAGNOSIS — J9611 Chronic respiratory failure with hypoxia: Secondary | ICD-10-CM | POA: Diagnosis present

## 2023-03-29 DIAGNOSIS — N2581 Secondary hyperparathyroidism of renal origin: Secondary | ICD-10-CM | POA: Diagnosis present

## 2023-03-29 DIAGNOSIS — E11649 Type 2 diabetes mellitus with hypoglycemia without coma: Secondary | ICD-10-CM | POA: Diagnosis present

## 2023-03-29 DIAGNOSIS — F411 Generalized anxiety disorder: Secondary | ICD-10-CM | POA: Diagnosis present

## 2023-03-29 DIAGNOSIS — K7460 Unspecified cirrhosis of liver: Secondary | ICD-10-CM | POA: Diagnosis present

## 2023-03-29 DIAGNOSIS — R531 Weakness: Secondary | ICD-10-CM | POA: Diagnosis present

## 2023-03-29 DIAGNOSIS — G9341 Metabolic encephalopathy: Secondary | ICD-10-CM | POA: Diagnosis not present

## 2023-03-29 DIAGNOSIS — E876 Hypokalemia: Secondary | ICD-10-CM | POA: Diagnosis present

## 2023-03-29 DIAGNOSIS — L4050 Arthropathic psoriasis, unspecified: Secondary | ICD-10-CM | POA: Diagnosis present

## 2023-03-29 DIAGNOSIS — I2510 Atherosclerotic heart disease of native coronary artery without angina pectoris: Secondary | ICD-10-CM | POA: Diagnosis present

## 2023-03-29 DIAGNOSIS — D631 Anemia in chronic kidney disease: Secondary | ICD-10-CM | POA: Diagnosis present

## 2023-03-29 DIAGNOSIS — I700 Atherosclerosis of aorta: Secondary | ICD-10-CM | POA: Diagnosis present

## 2023-03-29 DIAGNOSIS — N186 End stage renal disease: Secondary | ICD-10-CM | POA: Diagnosis present

## 2023-03-29 DIAGNOSIS — D62 Acute posthemorrhagic anemia: Secondary | ICD-10-CM | POA: Diagnosis present

## 2023-03-29 DIAGNOSIS — G928 Other toxic encephalopathy: Secondary | ICD-10-CM | POA: Diagnosis present

## 2023-03-29 DIAGNOSIS — E1122 Type 2 diabetes mellitus with diabetic chronic kidney disease: Secondary | ICD-10-CM | POA: Diagnosis present

## 2023-03-29 DIAGNOSIS — H0520 Unspecified exophthalmos: Secondary | ICD-10-CM | POA: Diagnosis present

## 2023-03-29 DIAGNOSIS — D693 Immune thrombocytopenic purpura: Secondary | ICD-10-CM | POA: Diagnosis present

## 2023-03-29 DIAGNOSIS — E1151 Type 2 diabetes mellitus with diabetic peripheral angiopathy without gangrene: Secondary | ICD-10-CM | POA: Diagnosis present

## 2023-03-29 DIAGNOSIS — J4489 Other specified chronic obstructive pulmonary disease: Secondary | ICD-10-CM | POA: Diagnosis present

## 2023-03-29 DIAGNOSIS — Z992 Dependence on renal dialysis: Secondary | ICD-10-CM | POA: Diagnosis not present

## 2023-03-29 DIAGNOSIS — K921 Melena: Secondary | ICD-10-CM | POA: Diagnosis not present

## 2023-03-29 DIAGNOSIS — I132 Hypertensive heart and chronic kidney disease with heart failure and with stage 5 chronic kidney disease, or end stage renal disease: Secondary | ICD-10-CM | POA: Diagnosis present

## 2023-03-29 DIAGNOSIS — Z952 Presence of prosthetic heart valve: Secondary | ICD-10-CM | POA: Diagnosis not present

## 2023-03-29 DIAGNOSIS — I5032 Chronic diastolic (congestive) heart failure: Secondary | ICD-10-CM | POA: Diagnosis present

## 2023-03-29 LAB — COMPREHENSIVE METABOLIC PANEL
ALT: 8 U/L (ref 0–44)
AST: 12 U/L — ABNORMAL LOW (ref 15–41)
Albumin: 3 g/dL — ABNORMAL LOW (ref 3.5–5.0)
Alkaline Phosphatase: 40 U/L (ref 38–126)
Anion gap: 8 (ref 5–15)
BUN: 14 mg/dL (ref 8–23)
CO2: 26 mmol/L (ref 22–32)
Calcium: 8.6 mg/dL — ABNORMAL LOW (ref 8.9–10.3)
Chloride: 98 mmol/L (ref 98–111)
Creatinine, Ser: 5.37 mg/dL — ABNORMAL HIGH (ref 0.44–1.00)
GFR, Estimated: 8 mL/min — ABNORMAL LOW (ref >60)
Glucose, Bld: 106 mg/dL — ABNORMAL HIGH (ref 70–99)
Potassium: 3.2 mmol/L — ABNORMAL LOW (ref 3.5–5.1)
Sodium: 132 mmol/L — ABNORMAL LOW (ref 135–145)
Total Bilirubin: 0.7 mg/dL (ref 0.0–1.2)
Total Protein: 5.4 g/dL — ABNORMAL LOW (ref 6.5–8.1)

## 2023-03-29 LAB — CBG MONITORING, ED
Glucose-Capillary: 98 mg/dL (ref 70–99)
Glucose-Capillary: 106 mg/dL — ABNORMAL HIGH (ref 70–99)
Glucose-Capillary: 107 mg/dL — ABNORMAL HIGH (ref 70–99)

## 2023-03-29 LAB — CBC
HCT: 21.7 % — ABNORMAL LOW (ref 36.0–46.0)
Hemoglobin: 6.1 g/dL — CL (ref 12.0–15.0)
MCH: 32.1 pg (ref 26.0–34.0)
MCHC: 28.1 g/dL — ABNORMAL LOW (ref 30.0–36.0)
MCV: 114.2 fL — ABNORMAL HIGH (ref 80.0–100.0)
Platelets: 90 10*3/uL — ABNORMAL LOW (ref 150–400)
RBC: 1.9 MIL/uL — ABNORMAL LOW (ref 3.87–5.11)
RDW: 17 % — ABNORMAL HIGH (ref 11.5–15.5)
WBC: 7.4 10*3/uL (ref 4.0–10.5)
nRBC: 0 % (ref 0.0–0.2)

## 2023-03-29 LAB — T4, FREE: Free T4: 0.71 ng/dL (ref 0.61–1.12)

## 2023-03-29 LAB — HEMOGLOBIN AND HEMATOCRIT, BLOOD
HCT: 26.1 % — ABNORMAL LOW (ref 36.0–46.0)
HCT: 30.8 % — ABNORMAL LOW (ref 36.0–46.0)
Hemoglobin: 7.3 g/dL — ABNORMAL LOW (ref 12.0–15.0)
Hemoglobin: 9.1 g/dL — ABNORMAL LOW (ref 12.0–15.0)

## 2023-03-29 LAB — GLUCOSE, CAPILLARY
Glucose-Capillary: 157 mg/dL — ABNORMAL HIGH (ref 70–99)
Glucose-Capillary: 192 mg/dL — ABNORMAL HIGH (ref 70–99)

## 2023-03-29 LAB — PREPARE RBC (CROSSMATCH)

## 2023-03-29 LAB — PHOSPHORUS: Phosphorus: 3.7 mg/dL (ref 2.5–4.6)

## 2023-03-29 LAB — HEPATITIS B SURFACE ANTIGEN: Hepatitis B Surface Ag: NONREACTIVE

## 2023-03-29 MED ORDER — CHLORHEXIDINE GLUCONATE CLOTH 2 % EX PADS
6.0000 | MEDICATED_PAD | Freq: Every day | CUTANEOUS | Status: DC
Start: 2023-03-30 — End: 2023-04-02
  Administered 2023-03-30 – 2023-04-01 (×3): 6 via TOPICAL

## 2023-03-29 MED ORDER — PANTOPRAZOLE SODIUM 40 MG IV SOLR
40.0000 mg | Freq: Two times a day (BID) | INTRAVENOUS | Status: DC
  Administered 2023-03-29 – 2023-04-01 (×7): 40 mg via INTRAVENOUS
  Filled 2023-03-29 (×7): qty 10

## 2023-03-29 MED ORDER — SODIUM CHLORIDE 0.9% IV SOLUTION: Freq: Once | INTRAVENOUS | Status: DC

## 2023-03-29 MED ORDER — DEXTROSE IN LACTATED RINGERS 5 % IV SOLN: INTRAVENOUS | Status: DC

## 2023-03-29 MED ORDER — HYDROCORTISONE SOD SUC (PF) 100 MG IJ SOLR
100.0000 mg | Freq: Two times a day (BID) | INTRAMUSCULAR | Status: DC
  Administered 2023-03-29 – 2023-03-30 (×3): 100 mg via INTRAVENOUS
  Filled 2023-03-29 (×4): qty 2

## 2023-03-29 MED ORDER — DEXTROSE 5 % IV SOLN: INTRAVENOUS | Status: AC

## 2023-03-29 NOTE — Progress Notes (Addendum)
 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 - Preparing and transfusing 2 unit of blood. - Continue to monitor H&H.  Transfuse as needed. - Inform again consulting GI for evaluation. -Holding pharmacological DVT prophylaxis and aspirin. - Starting Protonix IV. -Per chart review patient has history of GI bleed secondary to AVM. - Changing bed request to progressive unit. -Patient follows [facility] outpatient.  Informed and consulted Dr. Lorenso Quarry request for evaluation.   Tereasa Coop, MD [facility] 03/29/2023, 6:36 AM

## 2023-03-29 NOTE — ED Notes (Signed)
 Pt rec'g blood transfusion.  Unable to collect Hepatitis Panels while getting blood.

## 2023-03-29 NOTE — Progress Notes (Signed)
 [facility] ROUNDING  NOTE CHARNELE SEMPLE ZOX:[id number]  DOB: [birth year]  DOA: 03/28/2023  PCP: Genelle Gather, MD  03/29/2023,7:52 AM  LOS: 0 days   Code Status: Full from: Home current Dispo: Unclear   63 year old black female Known prior upper GI bleed 12/17 through 01/29/2023 admission secondary to angiodysplasia/DL FOA endoscopy Dr. 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 bright red blood per rectum hemoglobin 6.1 GI consulted Nephrology consulted as well for dialysis needs  Plan  Acute toxic metabolic encephalopathy on admission Hypoglycemic hypotensive on admission and had been taking Ativan 0.5 mg at home  Also was taking several blood pressure medications at home--all of which have been held at this time Hopeful for resolution  Hypotension on admission Bolused 500 cc saline, given albumin 50 midodrine 10 Start D5 LR at this time  ?acute GI bleed Prior admission 01/2023 2 angiodysplastic lesions cauterized hemoglobin has dropped from 7.5-6.1 and patient will be transfused 2 units PRBC Aspirin has been held -will need cardiology to let us know if needs aspirin Start Protonix 40 IV twice daily, keep n.p.o., start D5 LR 75 cc/H Await further word from GI regarding workup--sent  message to GI as may need colonoscopy-- watery stools with some bright red blood per rectum( takes hydrocortisone ointment already) so may be lower GI bleed given recent evaluation of upper last month We will repeat hemoglobin prior to transfuion  Anemia of acute blood loss Anemia of renal disease Baseline hemoglobin is 7-8 She has dropped to 6.1 and will get 2 units PRBC She also has an undefined thrombocytopenia which may need workup although note is also on Allopurinol  ESRD MWF Received full HD 12/14-nephrology consulted At this time holding Sensipar 60 MWF  Chronic COPD on 2 to 3 L at home at baseline Continue oxygen 2 L--CXR on admission some congestion but no real wheeze on exam Will need fluid at this time  CAD + CABG 2022 TAVR 12/2022 on aspirin Diastolic heart failure EF 60-65% previously Holding all GDMT Coreg 25 twice daily clonidine patch removed Imdur 60 Benicar 40 and aspirin at this time.  Psoriatic arthritis Gout Seems to be on prednisone 5-would hold allopurinol at this time  Altered TSH and Cortisol--?Addisonian 2/2 chronic prednisone? Will trial Cortef 100 q12 IV Check t4 and work up as TSH altered  Generalized anxiety disorder May continue BuSpar 5 3 times daily stop Ativan    DVT prophylaxis: SCD at this time  Status is: Inpatient Remains inpatient appropriate because:   Requires further workup    Subjective: Poor historian--called daughter Adina-[phone]-states she was a little confused, was more confused than prior---no real recent bleeding---had a bout bleeding at home several weeks ago--she gives herself her own meds typically---hasn't been on DM meds 2/2 weight loss.  She had a slight fever over past couple days ---took  raisin bread and shake yesterday  She get very confused with 1 tablet of ativan--and on the half tablet she is better-when she takes Ativan at baseline she gets tremulous ==she had been taking the 1/2 tablet as daughter was  in [place] recently and might have been anxious because of that  Patient herself tells me she has been having a little bit of watery darkish greenish stool but did not only blood on wiping   Objective + exam Vitals:   03/29/23 0400 03/29/23 0519 03/29/23 0600 03/29/23 0700  BP: (!) 103/47 (!) 119/51 (!) 110/51 (!) 101/49  Pulse: (!) 56 63 67   Resp: 18 (!) 22 (!) 21 (!) 24  Temp:  98.5 F (36.9 C)    TempSrc:  Oral    SpO2: 100% 100% 99%   Weight:      Height:       Filed Weights   03/28/23 1602  Weight: 58 kg    Examination: Awake a little tremulous but able to orient Chest is clear no wheeze no rales no rhonchi She does have some proptosis Abdomen is soft slightly distended somewhat mildly tender A little bit of tenderness over left CVA No lower extremity edema ROM is intact moving 4 limbs equally  Data Reviewed: reviewed   CBC    Component Value Date/Time   WBC 7.4 03/29/2023 0519   RBC 1.90 (L) 03/29/2023 0519   HGB 6.1 (LL) 03/29/2023 0519   HCT 21.7 (L) 03/29/2023 0519   PLT 90 (L) 03/29/2023 0519   MCV 114.2 (H) 03/29/2023 0519   MCV 94.5 10/12/2012 1211   MCH 32.1 03/29/2023 0519   MCHC 28.1 (L) 03/29/2023 0519   RDW 17.0 (H) 03/29/2023 0519   LYMPHSABS 0.6 (L) 03/28/2023 1745   MONOABS 0.4 03/28/2023 1745   EOSABS 1.1 (H) 03/28/2023 1745   BASOSABS 0.1 03/28/2023 1745      Latest Ref Rng & Units 03/29/2023    5:19 AM 03/28/2023    5:45 PM 02/18/2023    9:15 AM  CMP  Glucose 70 - 99 mg/dL 161  89  78   BUN 8 - 23 mg/dL 14  12  29    Creatinine 0.44 - 1.00 mg/dL 0.96  0.45  4.09   Sodium 135 - 145 mmol/L 132  133  137   Potassium 3.5 - 5.1 mmol/L 3.2  3.4  3.6   Chloride 98 - 111 mmol/L 98  98  101   CO2 22 - 32 mmol/L 26  24  26    Calcium 8.9 - 10.3 mg/dL 8.6  8.8  9.3   Total Protein 6.5 - 8.1 g/dL 5.4  5.3  6.9   Total Bilirubin 0.0 - 1.2 mg/dL 0.7  0.6  0.7   Alkaline Phos 38 - 126 U/L 40  52  76   AST 15 - 41 U/L 12  17  13    ALT 0 - 44 U/L 8  9   9      Scheduled Meds:  sodium chloride   Intravenous Once   [START ON 03/30/2023] allopurinol  100 mg Oral Q M,W,F-HD   arformoterol  15 mcg Nebulization BID   And   umeclidinium bromide  1 puff Inhalation Daily   busPIRone  5 mg Oral TID   cholecalciferol  2,000 Units Oral Daily   [START ON 03/30/2023] cinacalcet  60 mg Oral Once per day on Monday Friday   And   [START ON 04/01/2023] cinacalcet  90 mg  Oral Every Wednesday   pantoprazole (PROTONIX) IV  40 mg Intravenous Q12H   sodium chloride flush  3 mL Intravenous Q12H   sodium chloride flush  3 mL Intravenous Q12H   Continuous Infusions:  sodium chloride      Time 46  Rhetta Mura, MD  [facility]

## 2023-03-29 NOTE — Consult Note (Signed)
 [facility] Gastroenterology Consult  Referring Provider: No ref. provider found Primary Care Physician:  Genelle Gather, MD Primary Gastroenterologist: Deboraha Sprang GI-Dr. [facility]  Reason for Consultation: Anemia, hematochezia  SUBJECTIVE:   HPI: Jodi Bell is a 63 y.o. female known to GI service with past medical history significant for anemia of end-stage renal disease, end-stage renal disease on hemodialysis Monday/Wednesday/Friday, COPD, aortic stenosis status post TAVR on 12/23/2022, coronary artery disease status post CABG 09/2020.  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  Presented to hospital on 03/28/2023 with weakness and concern for CVA.  Concern for polypharmacy as patient was using Ativan prior to admission.  [facility] requested to evaluate for anemia, small-volume hematochezia.  Discussed patient with nursing at bedside, very small amount of bright red blood per rectum since admission, no large-volume.  Nephrology has evaluated patient, likely anemia of end-stage renal disease, undergoing transfusion.  Pending hemodialysis 03/30/2023.  Patient denied any large-volume blood per rectum.  Having some new left-sided abdominal discomfort.  Having some left-sided back pain.  No nausea or vomiting.  No chest pain or shortness of breath.  Having some vaginal pruritus.  Video capsule endoscopy 07/23/2021 showed mid small bowel fresh blood, question second area of bleeding in distal small bowel with old blood in colon.  Colonoscopy 10/02/2020 showed internal hemorrhoids, sigmoid/descending/transverse/ascending diverticulosis.  Past Medical History:  Diagnosis Date   Anemia of chronic disease    Anxiety    Arthritis    oa and  psoriatic ra   Asthma    Back pain, chronic    lower back   Chronic insomnia    COPD (chronic obstructive pulmonary disease) (HCC)    Diabetes mellitus    type 2   ESRD on dialysis [facility])    Essential hypertension    GERD (gastroesophageal reflux disease)    Gout    History of hiatal hernia    Hypoalbuminemia    Hyponatremia    Mild aortic stenosis    Mitral regurgitation    Mitral stenosis    Obesity    PAD (peripheral artery disease) (HCC)    a. externial iliac calcification seen on CT 04/2020.   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 Lynnette Caffey & Laneta Simmers.   Sleep apnea    Tobacco abuse    Upper GI bleed 05/2019   Past Surgical History:  Procedure Laterality Date   BACK SURGERY  2016   lower back fusion with cage   BIOPSY  09/23/2017   Procedure: BIOPSY;  Surgeon: Graylin Shiver, MD;  Location: [facility] ENDOSCOPY;  Service: Endoscopy;;   BIOPSY  05/19/2019   Procedure: BIOPSY;  Surgeon: Kathi Der, MD;  Location: [facility] ENDOSCOPY;  Service: Gastroenterology;;   CESAREAN SECTION  1990   x 1    COLONOSCOPY WITH PROPOFOL N/A 09/23/2017   Procedure: COLONOSCOPY WITH PROPOFOL Hemostatic clips placed;  Surgeon: Graylin Shiver, MD;  Location: [facility] ENDOSCOPY;  Service: Endoscopy;  Laterality: N/A;   COLONOSCOPY WITH PROPOFOL N/A 10/02/2020   Procedure: COLONOSCOPY WITH PROPOFOL;  Surgeon: Willis Modena, MD;  Location: [facility] ENDOSCOPY;  Service: Endoscopy;  Laterality: N/A;   CORONARY ARTERY BYPASS GRAFT N/A 09/13/2020   Procedure:  CORONARY ARTERY BYPASS GRAFTING (CABG), ON PUMP, TIMES TWO, USING LEFT INTERNAL MAMMARY ARTERY AND RIGHT ENDOSCOPICALLY HARVESTED GREATER SAPHENOUS VEIN;  Surgeon: Alleen Borne, MD;  Location: [facility] OR;  Service: Open Heart Surgery;  Laterality: N/A;   DILATION AND CURETTAGE OF UTERUS  1988   ENTEROSCOPY N/A 07/22/2021   Procedure: ENTEROSCOPY;  Surgeon: Kerin Salen, MD;  Location: [facility]  ENDOSCOPY;  Service: Gastroenterology;  Laterality: N/A;   ENTEROSCOPY N/A 01/28/2023   Procedure: ENTEROSCOPY;  Surgeon: [name], MD;  Location: [facility] ENDOSCOPY;  Service: Gastroenterology;  Laterality: N/A;   ESOPHAGOGASTRODUODENOSCOPY N/A 09/22/2020   Procedure: ESOPHAGOGASTRODUODENOSCOPY (EGD);  Surgeon: Willis Modena, MD;  Location: [facility] ENDOSCOPY;  Service: Endoscopy;  Laterality: N/A;   ESOPHAGOGASTRODUODENOSCOPY (EGD) WITH PROPOFOL N/A 09/23/2017   Procedure: ESOPHAGOGASTRODUODENOSCOPY (EGD) WITH PROPOFOL;  Surgeon: Graylin Shiver, MD;  [facility] ENDOSCOPY;  Service: Endoscopy;  Laterality: N/A;   ESOPHAGOGASTRODUODENOSCOPY (EGD) WITH PROPOFOL N/A 05/19/2019   Procedure: ESOPHAGOGASTRODUODENOSCOPY (EGD) WITH PROPOFOL;  Surgeon: Kathi Der, MD;  [facility]OS[facility];  Service: Gastroenterology;  Laterality: N/A;   ESOPHAGOGASTRODUODENOSCOPY (EGD) WITH PROPOFOL N/A 10/02/2020   Procedure: ESOPHAGOGASTRODUODENOSCOPY (EGD) WITH PROPOFOL;  Surgeon: Willis Modena, MD;  Location: [facility] ENDOSCOPY;  Service: Endoscopy;  Laterality: N/A;   GIVENS CAPSULE STUDY N/A 05/19/2019   Procedure: GIVENS CAPSULE STUDY;  Surgeon: Kathi Der, MD;  [facility] ENDOSCOPY;  Service: Gastroenterology;  Laterality: N/A;   GIVENS CAPSULE STUDY N/A 07/19/2021   Procedure: GIVENS CAPSULE STUDY;  Surgeon: [name], MD;  Location: [facility] ENDOSCOPY;  Service: Gastroenterology;  Laterality: N/A;   HEMOSTASIS CLIP PLACEMENT  09/22/2020   Procedure: HEMOSTASIS CLIP PLACEMENT;  Surgeon: Willis Modena, MD;  [facility] ENDOS[facility];  Service: Endoscopy;;   HEMOSTASIS CONTROL  09/22/2020   Procedure: HEMOSTASIS CONTROL;  Surgeon: Willis Modena, MD;  Location: [facility] ENDOSCOPY;  Service: Endoscopy;;   HOT HEMOSTASIS N/A 05/19/2019   Procedure: HOT HEMOSTASIS (ARGON PLASMA COAGULATION/BICAP);  Surgeon: Kathi Der, MD;  Location: [facility] ENDOSCOPY;  Service: Gastroenterology;  Laterality: N/A;   HOT HEMOSTASIS N/A  01/28/2023   Procedure: HOT HEMOSTASIS (ARGON PLASMA COAGULATION/BICAP);  Surgeon: [name], MD;  Location: [facility] ENDOSCOPY;  Service: Gastroenterology;  Laterality: N/A;   INTRAOPERATIVE TRANSTHORACIC ECHOCARDIOGRAM N/A 12/23/2022   Procedure: INTRAOPERATIVE TRANSTHORACIC ECHOCARDIOGRAM;  Surgeon: [name], MD;  [facility] INVASIVE CV LAB;  Service: Cardiovascular;  Laterality: N/A;   LEFT HEART CATH AND CORONARY ANGIOGRAPHY N/A 09/10/2020   Procedure: LEFT HEART CATH AND CORONARY ANGIOGRAPHY;  Surgeon: Yvonne Kendall, MD;  [facility] INVASIVE CV LAB;  Service: Cardiovascular;  Laterality: N/A;   PARTIAL KNEE ARTHROPLASTY Left 11/12/2017   Procedure: left unicompartmental arthroplasty-medial;  Surgeon: Durene Romans, MD;  Location: [facility] ORS;  Service: Orthopedics;  Laterality: Left;    RIGHT HEART CATH AND CORONARY/GRAFT ANGIOGRAPHY N/A 06/26/2022   Procedure: RIGHT HEART CATH AND CORONARY/GRAFT ANGIOGRAPHY;  Surgeon: [name], MD;  [facility] INVASIVE CV LAB;  Service: Cardiovascular;  Laterality: N/A;   SUBMUCOSAL INJECTION  09/23/2017   Procedure: SUBMUCOSAL INJECTION;  Surgeon: Graylin Shiver, MD;  [facility]: [facility] ENDOSCOPY;  Service: Endoscopy;;  in colon   TEE WITHOUT CARDIOVERSION N/A 09/13/2020   Procedure: TRANSESOPHAGEAL ECHOCARDIOGRAM (TEE);  Surgeon: Alleen Borne, MD;  Location: [facility] OR;  Service: Open Heart Surgery;  Laterality: N/A;   TUBAL LIGATION  1990   Prior to Admission medications   Medication Sig Start Date End Date Taking? Authorizing Provider  acetaminophen (TYLENOL) 500 MG tablet Take 1,000 mg  by mouth daily as needed (pain).   Yes [provider]  albuterol (PROVENTIL) (2.5 MG/3ML) 0.083% nebulizer solution Take 3 mLs (2.5 mg total) by nebulization every 6 (six) hours as needed for wheezing or shortness of breath. 07/10/21  Yes Hunsucker, Lesia Sago, MD  allopurinol (ZYLOPRIM) 100 MG tablet Take 100 mg by mouth every evening.   Yes [provider]  Artificial Tear Solution (TEARS NATURALE OP) Place 1 drop into both eyes 4 (four) times daily as needed (dry eye).   Yes [provider]  aspirin 81 MG chewable tablet Chew 1 tablet (81 mg total) by mouth every other day. 01/07/23  Yes Monna Fam, MD  busPIRone (BUSPAR) 5 MG tablet Take 5 mg by mouth 3 (three) times daily. 01/01/23 01/01/24 Yes [provider]  carvedilol (COREG) 25 MG tablet Take 1 tablet (25 mg total) by mouth 2 (two) times daily with a meal. 12/25/22  Yes Janetta Hora, PA-C  cetirizine (ZYRTEC) 10 MG tablet Take 10 mg by mouth daily as needed for allergies.   Yes [provider]  Cholecalciferol (VITAMIN D) 50 MCG (2000 UT) tablet Take 2,000 Units by mouth daily.   Yes [provider]  cinacalcet (SENSIPAR) 30 MG tablet Take 60 mg by mouth every Monday, Wednesday, and Friday with hemodialysis. Pt states she takes 2 tab on Mon, 3 tab on Wed, and 2 tab on Fri.   Yes [provider]  cloNIDine (CATAPRES - DOSED IN MG/24 HR) 0.3 mg/24hr patch Place 0.3 mg onto the skin every Sunday.   Yes [provider]  diclofenac Sodium (VOLTAREN) 1 % GEL Apply 2 g topically daily as needed (pain).   Yes [provider]  docusate sodium (COLACE) 100 MG capsule Take 100 mg by mouth daily as needed for moderate constipation.   Yes [provider]  fluticasone (FLONASE) 50 MCG/ACT nasal spray Place 1 spray into both nostrils daily as needed for allergies or rhinitis.   Yes [provider]  isosorbide mononitrate (IMDUR) 60 MG 24 hr tablet Take 1 tablet (60 mg total) by mouth daily. 10/27/22  Yes Jake Bathe, MD  LORazepam (ATIVAN) 0.5 MG tablet Take 0.5 mg by mouth 3 (three) times daily.   Yes [provider]  montelukast (SINGULAIR) 10 MG tablet Take 10 mg by mouth daily with supper.    Yes [provider]  multivitamin (RENA-VIT) TABS tablet Take 1 tablet by mouth daily.   Yes  [provider]  NIFEdipine (PROCARDIA XL/NIFEDICAL XL) 60 MG 24 hr tablet Take 60 mg by mouth 2 (two) times daily. 10/01/21  Yes [provider]  olmesartan (BENICAR) 40 MG tablet Take 40 mg by mouth every evening. 05/06/19  Yes [provider]  ondansetron (ZOFRAN) 4 MG tablet Take 4 mg by mouth 2 (two) times daily as needed for nausea or vomiting.   Yes [provider]  OXYGEN Inhale 2-3 L into the lungs continuous.   Yes [provider]  pantoprazole (PROTONIX) 40 MG tablet Take 40 mg by mouth 2 (two) times daily. 06/09/20  Yes [provider]  predniSONE (DELTASONE) 5 MG tablet Take 1 tablet (5 mg total) by mouth daily with breakfast. 02/27/22  Yes Marrianne Mood, MD  Probiotic Product (PROBIOTIC PO) Take 1 capsule by mouth daily.   Yes [provider]  silver sulfADIAZINE (SILVADENE) 1 % cream Apply 1 Application topically daily as needed (to prevent wound).   Yes [provider]  Tiotropium Bromide-Olodaterol (STIOLTO RESPIMAT) 2.5-2.5 MCG/ACT AERS Inhale 2 puffs once daily into the lungs. 11/28/21  Yes Hunsucker, Lesia Sago, MD  VENTOLIN HFA 108 (90 Base) MCG/ACT inhaler Inhale 2 puffs into the lungs See admin instructions. Take 2 puffs every 4-6 hours as needed for wheezing and shortness of breath 04/08/22  Yes Hunsucker, Lesia Sago, MD  zolpidem (AMBIEN) 5 MG tablet Take 5 mg by mouth at bedtime as needed for sleep. 02/04/21  Yes [provider]  amoxicillin (AMOXIL) 500 MG capsule Take 4 capsules (2,000 mg total) by mouth as directed. Take 4 tablets 1 hour prior to dental work, including cleanings. Patient not taking: Reported on 03/28/2023 03/19/23   Georgie Chard D, NP  EPINEPHrine 0.3 mg/0.3 mL IJ SOAJ injection Inject into the muscle as needed for anaphylaxis. Patient not taking: Reported on 03/28/2023 07/12/21   [provider]  hydrocortisone 2.5%-nystatin-zinc oxide 20% 1:1:1 ointment mixture Apply  topically daily as needed. Patient not taking: Reported on 03/28/2023 01/07/23   Monna Fam, MD   Current Facility-Administered Medications  Medication Dose Route Frequency Provider Last Rate Last Admin   0.9 %  sodium chloride infusion (Manually program via Guardrails IV Fluids)   Intravenous Once Sundil, Subrina, MD       0.9 %  sodium chloride infusion  250 mL Intravenous PRN Janalyn Shy, Subrina, MD       acetaminophen (TYLENOL) tablet 650 mg  650 mg Oral Q6H PRN Janalyn Shy, Subrina, MD       Or   acetaminophen (TYLENOL) suppository 650 mg  650 mg Rectal Q6H PRN Janalyn Shy, Subrina, MD       albuterol (PROVENTIL) (2.5 MG/3ML) 0.083% nebulizer solution 2.5 mg  2.5 mg Nebulization Q6H PRN Janalyn Shy, Subrina, MD       Melene Muller ON 03/30/2023] allopurinol (ZYLOPRIM) tablet 100 mg  100 mg Oral Q M,W,F-HD Janalyn Shy, Subrina, MD       arformoterol [facility]) nebulizer solution 15 mcg  15 mcg Nebulization BID Sundil, Subrina, MD       And   umeclidinium bromide (INCRUSE ELLIPTA) 62.5 MCG/ACT 1 puff  1 puff Inhalation Daily Sundil, Subrina, MD       busPIRone (BUSPAR) tablet 5 mg  5 mg Oral TID Janalyn Shy, Subrina, MD       Melene Muller ON 03/30/2023] Chlorhexidine Gluconate Cloth 2 % PADS 6 each  6 each Topical Q0600 Delano Metz, MD       cholecalciferol (VITAMIN D3) 25 MCG (1000 UNIT) tablet 2,000 Units  2,000 Units Oral Daily Janalyn Shy, Subrina, MD       Melene Muller ON 03/30/2023] cinacalcet (SENSIPAR) tablet 60 mg  60 mg Oral Once per day on Monday Friday Janalyn Shy, Subrina, MD       And   [START ON 04/01/2023] cinacalcet (SENSIPAR) tablet 90 mg  90 mg Oral Every Wednesday Janalyn Shy, Subrina, MD       dextrose 5 % solution   Intravenous Continuous Rhetta Mura, MD       hydrocortisone sodium succinate (SOLU-CORTEF) 100 MG injection 100 mg  100 mg Intravenous Q12H Rhetta Mura, MD   100 mg at 03/29/23 1109   ondansetron (ZOFRAN) tablet 4 mg  4 mg Oral Q6H PRN Janalyn Shy, Subrina, MD       Or   ondansetron [facility]) injection 4  mg  4 mg Intravenous Q6H PRN Janalyn Shy, Subrina, MD       pantoprazole (PROTONIX) injection 40 mg  40 mg Intravenous Q12H Tereasa Coop, MD   40  mg at 03/29/23 1109   sodium chloride flush (NS) 0.9 % injection 3 mL  3 mL Intravenous Q12H Sundil, Subrina, MD       sodium chloride flush (NS) 0.9 % injection 3 mL  3 mL Intravenous Q12H Sundil, Subrina, MD   3 mL at 03/29/23 0150   sodium chloride flush (NS) 0.9 % injection 3 mL  3 mL Intravenous PRN Janalyn Shy, Subrina, MD       Current Outpatient Medications  Medication Sig Dispense Refill   acetaminophen (TYLENOL) 500 MG tablet Take 1,000 mg by mouth daily as needed (pain).     albuterol (PROVENTIL) (2.5 MG/3ML) 0.083% nebulizer solution Take 3 mLs (2.5 mg total) by nebulization every 6 (six) hours as needed for wheezing or shortness of breath. 75 mL 6   allopurinol (ZYLOPRIM) 100 MG tablet Take 100 mg by mouth every evening.     Artificial Tear Solution (TEARS NATURALE OP) Place 1 drop into both eyes 4 (four) times daily as needed (dry eye).     aspirin 81 MG chewable tablet Chew 1 tablet (81 mg total) by mouth every other day.     busPIRone (BUSPAR) 5 MG tablet Take 5 mg by mouth 3 (three) times daily.     carvedilol (COREG) 25 MG tablet Take 1 tablet (25 mg total) by mouth 2 (two) times daily with a meal. 180 tablet 1   cetirizine (ZYRTEC) 10 MG tablet Take 10 mg by mouth daily as needed for allergies.     Cholecalciferol (VITAMIN D) 50 MCG (2000 UT) tablet Take 2,000 Units by mouth daily.     cinacalcet (SENSIPAR) 30 MG tablet Take 60 mg by mouth every Monday, Wednesday, and Friday with hemodialysis. Pt states she takes 2 tab on Mon, 3 tab on Wed, and 2 tab on Fri.     cloNIDine (CATAPRES - DOSED IN MG/24 HR) 0.3 mg/24hr patch Place 0.3 mg onto the skin every Sunday.     diclofenac Sodium (VOLTAREN) 1 % GEL Apply 2 g topically daily as needed (pain).     docusate sodium (COLACE) 100 MG capsule Take 100 mg by mouth daily as needed for moderate  constipation.     fluticasone (FLONASE) 50 MCG/ACT nasal spray Place 1 spray into both nostrils daily as needed for allergies or rhinitis.     isosorbide mononitrate (IMDUR) 60 MG 24 hr tablet Take 1 tablet (60 mg total) by mouth daily. 90 tablet 1   LORazepam (ATIVAN) 0.5 MG tablet Take 0.5 mg by mouth 3 (three) times daily.     montelukast (SINGULAIR) 10 MG tablet Take 10 mg by mouth daily with supper.      multivitamin (RENA-VIT) TABS tablet Take 1 tablet by mouth daily.     NIFEdipine (PROCARDIA XL/NIFEDICAL XL) 60 MG 24 hr tablet Take 60 mg by mouth 2 (two) times daily.     olmesartan (BENICAR) 40 MG tablet Take 40 mg by mouth every evening.     ondansetron (ZOFRAN) 4 MG tablet Take 4 mg by mouth 2 (two) times daily as needed for nausea or vomiting.     OXYGEN Inhale 2-3 L into the lungs continuous.     pantoprazole (PROTONIX) 40 MG tablet Take 40 mg by mouth 2 (two) times daily.     predniSONE (DELTASONE) 5 MG tablet Take 1 tablet (5 mg total) by mouth daily with breakfast.     Probiotic Product (PROBIOTIC PO) Take 1 capsule by mouth daily.     silver  sulfADIAZINE (SILVADENE) 1 % cream Apply 1 Application topically daily as needed (to prevent wound).     Tiotropium Bromide-Olodaterol (STIOLTO RESPIMAT) 2.5-2.5 MCG/ACT AERS Inhale 2 puffs once daily into the lungs. 4 g 3   VENTOLIN HFA 108 (90 Base) MCG/ACT inhaler Inhale 2 puffs into the lungs See admin instructions. Take 2 puffs every 4-6 hours as needed for wheezing and shortness of breath 18 g 6   zolpidem (AMBIEN) 5 MG tablet Take 5 mg by mouth at bedtime as needed for sleep.     amoxicillin (AMOXIL) 500 MG capsule Take 4 capsules (2,000 mg total) by mouth as directed. Take 4 tablets 1 hour prior to dental work, including cleanings. (Patient not taking: Reported on 03/28/2023) 12 capsule 12   EPINEPHrine 0.3 mg/0.3 mL IJ SOAJ injection Inject into the muscle as needed for anaphylaxis. (Patient not taking: Reported on 03/28/2023)      hydrocortisone 2.5%-nystatin-zinc oxide 20% 1:1:1 ointment mixture Apply topically daily as needed. (Patient not taking: Reported on 03/28/2023) 240 g 1   Allergies as of 03/28/2023 - Review Complete 03/28/2023  Allergen Reaction Noted   3-methyl-2-benzothiazolinone hydrazone Other (See Comments) 09/07/2012   Banana Anaphylaxis, Swelling, and Other (See Comments) 01/21/2018   Black walnut flavoring agent (non-screening) Anaphylaxis and Itching 05/18/2019   Hazelnut (filbert) Anaphylaxis 05/18/2019   Leflunomide and related Other (See Comments) 07/23/2012   Lisinopril Swelling 01/05/2014   No healthtouch food allergies Anaphylaxis 05/18/2019   Other Anaphylaxis, Swelling, and Other (See Comments) 09/07/2012   Pecan nut (diagnostic) Anaphylaxis and Itching 05/18/2019   Trazodone and nefazodone Other (See Comments) 07/23/2014   Adalimumab Other (See Comments) 07/23/2012   Escitalopram oxalate Other (See Comments) 09/29/2013   Ezetimibe Other (See Comments) 01/13/2018   Secukinumab Swelling 05/18/2019   Statins Other (See Comments) 09/04/2014   Ferrlecit [na ferric gluc cplx in sucrose]  09/19/2020   Gabapentin Other (See Comments) 06/26/2021   Nsaids  06/24/2022   Family History  Problem Relation Age of Onset   Lung cancer Mother    Diabetes Sister    Heart disease Sister    Asthma Daughter    Arthritis Daughter    Arthritis Daughter    Thyroid cancer Other        1/2 sister   Heart disease Other        1/2 sister   Social History   Socioeconomic History   Marital status: Divorced    Spouse name: BASIL   Number of children: 3   Years of education: 1   Highest education level: GED or equivalent  Occupational History   Occupation: retired Public house manager  Tobacco Use   Smoking status: Former    Current packs/day: 0.00    Average packs/day: 0.5 packs/day for 40.0 years (20.0 ttl pk-yrs)    Types: Cigarettes    Start date: 08/1980    Quit date: 08/2020    Years since quitting: 2.6    Smokeless tobacco: Never  Vaping Use   Vaping status: Never Used  Substance and Sexual Activity   Alcohol use: Yes    Comment: occ   Drug use: No   Sexual activity: Not on file  Other Topics Concern   Not on file  Social History Narrative   Not on file   Social Drivers of Health   Financial Resource Strain: Low Risk  (02/26/2023)   Received from [facility]   Overall Financial Resource Strain (CARDIA)    Difficulty of Paying Living Expenses: Not hard  at all  Food Insecurity: No Food Insecurity (02/26/2023)   [facility]   Hunger Vital Sign    Worried About Running Out of Food in the Last Year: Never true    Ran Out of Food in the Last Year: Never true  Transportation Needs: Unmet Transportation Needs (02/26/2023)   Received from [facility] - Transportation    Lack of Transportation (Medical): Yes    Lack of Transportation (Non-Medical): Yes  Physical Activity: Unknown (02/26/2023)   Received from [facility]   Exercise Vital Sign    Days of Exercise per Week: 0 days    Minutes of Exercise per Session: Not on file  Stress: No Stress Concern Present (02/26/2023)   Received from [facility] - Occupational Stress Questionnaire    Feeling of Stress : Not at all  Social Connections: Socially Integrated (02/26/2023)   Received from [facility]   Social Network    How would you rate your social network (family, work, friends)?: Good participation with social networks  Intimate Partner Violence: Not At Risk (02/26/2023)   Received from [facility]   HITS    Over the last 12 months how often did your partner physically hurt you?: Never    Over the last 12 months how often did your partner insult you or talk down to you?: Never    Over the last 12 months how often did your partner threaten you with physical harm?: Never    Over the last 12 months how often did your partner scream or curse at you?: Never    Review of Systems:  Review of Systems  Respiratory:  Negative for shortness of breath.   Cardiovascular:  Negative for chest pain.  Gastrointestinal:  Positive for abdominal pain and blood in stool. Negative for nausea and vomiting.    OBJECTIVE:   Temp:  [96.4 F (35.8 C)-99.1 F (37.3 C)] 96.4 F (35.8 C) (02/16 1307) Pulse Rate:  [56-105] 63 (02/16 1307) Resp:  [10-24] 24 (02/16 1307) BP: (93-126)/(44-59) 106/47 (02/16 1307) SpO2:  [92 %-100 %] 100 % (02/16 1302) Weight:  [58 kg] 58 kg (02/15 1602)   Physical Exam Constitutional:      General: She is not in acute distress.    Appearance: She is not ill-appearing, toxic-appearing or diaphoretic.  HENT:     Mouth/Throat:     Mouth: Mucous membranes are dry.  Cardiovascular:     Rate and Rhythm: Normal rate and regular rhythm.     Heart sounds: Murmur heard.  Pulmonary:     Effort: No respiratory distress.     Breath sounds: Normal breath sounds.     Comments: Supplemental oxygen nasal cannula Abdominal:     General: Bowel sounds are normal. There is no distension.     Palpations: Abdomen is soft.     Tenderness: There is abdominal tenderness. There is no guarding.  Musculoskeletal:     Right lower leg: No edema.     Left lower leg: No edema.  Skin:    General: Skin is warm and dry.  Neurological:     Mental Status: She is alert.     Labs: Recent Labs    03/28/23 1745 03/29/23 0519 03/29/23 0905  WBC 7.5 7.4  --   HGB 7.5* 6.1* 7.3*  HCT 26.8* 21.7* 26.1*  PLT 110* 90*  --    BMET Recent Labs    03/28/23 1745 03/29/23  0519  NA 133* 132*  K 3.4* 3.2*  CL 98 98  CO2 24 26  GLUCOSE 89 106*  BUN 12 14  CREATININE 4.69* 5.37*  CALCIUM 8.8* 8.6*   LFT Recent Labs    03/29/23 0519  PROT 5.4*  ALBUMIN 3.0*  AST 12*  ALT 8  ALKPHOS 40  BILITOT 0.7   PT/INR Recent Labs    03/28/23 1745  LABPROT 16.8*  INR 1.3*    Diagnostic imaging: DG Chest Port 1 View Result Date:  03/28/2023 CLINICAL DATA:  weakness EXAM: PORTABLE CHEST 1 VIEW COMPARISON:  Chest x-ray 01/02/2023, CT cardiac 03/10/2023 FINDINGS: Persistent cardiomegaly. The heart and mediastinal contours are unchanged. Atherosclerotic plaque. Aortic valve replacement. Coronary artery calcification. Mitral annular calcification. Prominent hilar vasculature. No focal consolidation. No over pulmonary edema. No pleural effusion. No pneumothorax. No acute osseous abnormality.  Intact sternotomy wires noted. IMPRESSION: 1. Cardiomegaly with pulmonary venous congestion. 2. Aortic Atherosclerosis (ICD10-I70.0) including coronary artery and mitral annular calcification. Electronically Signed   By: Tish Frederickson M.D.   On: 03/28/2023 17:17   CT Head Wo Contrast Result Date: 03/28/2023 CLINICAL DATA:  Facial paralysis/weakness (CN 7) possible cva not acute EXAM: CT HEAD WITHOUT CONTRAST TECHNIQUE: Contiguous axial images were obtained from the base of the skull through the vertex without intravenous contrast. RADIATION DOSE REDUCTION: This exam was performed according to the departmental dose-optimization program which includes automated exposure control, adjustment of the mA and/or kV according to patient size and/or use of iterative reconstruction technique. COMPARISON:  CT head 01/02/2023, MRI head 05/02/2021 FINDINGS: Brain: Patchy and confluent areas of decreased attenuation are noted throughout the deep and periventricular white matter of the cerebral hemispheres bilaterally, compatible with chronic microvascular ischemic disease. No evidence of large-territorial acute infarction. No parenchymal hemorrhage. No mass lesion. No extra-axial collection. No mass effect or midline shift. No hydrocephalus. Basilar cisterns are patent. Vascular: No hyperdense vessel. Atherosclerotic calcifications are present within the cavernous internal carotid arteries. Skull: No acute fracture or focal lesion. Sinuses/Orbits: Paranasal sinuses and  mastoid air cells are clear. Chronic mild proptosis bilaterally. Bilateral lens replacement. Otherwise the orbits are unremarkable. Other: None. IMPRESSION: 1. No acute intracranial abnormality. 2. Chronic mild proptosis bilaterally. Consider ophthalmology consultation. Electronically Signed   By: Tish Frederickson M.D.   On: 03/28/2023 17:05   IMPRESSION: Bright red blood per rectum Anemia, chronic disease, end-stage renal disease on hemodialysis  History gastric AVM, last required endoscopic therapy on 01/28/2023 Pancolonic diverticulosis Aortic stenosis status post TAVR Coronary artery disease status post CABG  PLAN: -Undergoing transfusion, trend H/H -Monitor bowel movements, appears very small volume hematochezia per history, continue suppository therapy for internal hemorrhoids -If large-volume hematochezia were to occur, would recommend stat CT angiogram for GI bleed -Ok for diet from GI perspective -May benefit from eventual colonoscopy, though does not appear critical at this time [facility] will follow, Dr. Marca Ancona to see tomorrow   LOS: 0 days   Liliane Shi, DO [facility]

## 2023-03-29 NOTE — Consult Note (Signed)
 Renal Service Consult Note [facility]  Jodi Bell 03/29/2023 Jodi Krabbe, MD Requesting Physician: Dr. Mahala Menghini  Reason for Consult: ESRD pt w/ AMS HPI: The patient is a 63 y.o. year-old w/ PMH as below who presented to ED w/ family noticing slurred speech, not acting right. Had a witnessed fall recently when her "legs gave out". Did not hit her head. On 2L [place] at baseline. On HD mwf, last HD Friday.   PMH CABG x 2/ CAD ESRD HD MWF DM2 HTN Sp TAVR Anxiety Hx GIB's COPD - on 2-3 L [place] O2 at home  Pt seen in ED room. Pt cannot given much history. Dtr on the phone states she goes to all her HD sessions and doesn't skip or sign off early. Last HD was Friday. She has been taking a lot of her ativan so the pcp started buspar to try and get her off the ativan. But the pt told her earlier this week after one of her HD sessions that she felt like "that ativan is building up inside me".     ROS - denies CP, no joint pain, no HA, no blurry vision, no rash, no diarrhea, no nausea/ vomiting, no dysuria, no difficulty voiding   Past Medical History  Past Medical History:  Diagnosis Date   Anemia of chronic disease    Anxiety    Arthritis    oa and psoriatic ra   Asthma    Back pain, chronic    lower back   Chronic insomnia    COPD (chronic obstructive pulmonary disease) (HCC)    Diabetes mellitus    type 2   ESRD on dialysis [facility])    Essential hypertension    GERD (gastroesophageal reflux disease)    Gout    History of hiatal hernia    Hypoalbuminemia    Hyponatremia    Mild aortic stenosis    Mitral regurgitation    Mitral stenosis    Obesity    PAD (peripheral artery disease) (HCC)    a. externial iliac calcification seen on CT 04/2020.   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 Lynnette Caffey & Laneta Simmers.   Sleep apnea    Tobacco abuse    Upper GI bleed 05/2019   Past  Surgical History  Past Surgical History:  Procedure Laterality Date   BACK SURGERY  2016   lower back fusion with cage   BIOPSY  09/23/2017   Procedure: BIOPSY;  Surgeon: Graylin Shiver, MD;  Location: [facility] END[facility];  Service: Endoscopy;;   BIOPSY  05/19/2019   Procedure: BIOPSY;  Surgeon: Kathi Der, MD;  Location: [facility];  Service: Gastroenterology;;   CESAREAN SECTION  1990   x 1    COLONOSCOPY WITH PROPOFOL N/A 09/23/2017   Procedure: COLONOSCOPY WITH PROPOFOL Hemostatic clips placed;  Surgeon: Graylin Shiver, MD;  Location: [facility];  Service: Endoscopy;  Laterality: N/A;   COLONOSCOPY WITH PROPOFOL N/A 10/02/2020   Procedure: COLONOSCOPY WITH PROPOFOL;  Surgeon: Willis Modena, MD;  Location: [facility] ENDOSCOPY;  Service: Endoscopy;  Laterality: N/A;   CORONARY ARTERY BYPASS GRAFT N/A 09/13/2020   Procedure: CORONARY ARTERY BYPASS GRAFTING (CABG), ON PUMP, TIMES TWO, USING LEFT INTERNAL MAMMARY ARTERY AND RIGHT ENDOSCOPICALLY HARVESTED GREATER SAPHENOUS VEIN;  Surgeon: Alleen Borne, MD;  Location: [facility] OR;  Service: Open Heart Surgery;  Laterality: N/A;   DILATION AND CURETTAGE OF UTERUS  1988   ENTEROSCOPY N/A 07/22/2021   Procedure: ENTEROSCOPY;  Surgeon: Kerin Salen, MD;  Location: [facility] ENDOSCOPY;  Service: Gastroenterology;  Laterality: N/A;   ENTEROSCOPY N/A 01/28/2023   Procedure: ENTEROSCOPY;  Surgeon: [name], MD;  Location: [facility] ENDOSCOPY;  Service: Gastroenterology;  Laterality: N/A;   ESOPHAGOGASTRODUODENOSCOPY N/A 09/22/2020   Procedure: ESOPHAGOGASTRODUODENOSCOPY (EGD);  Surgeon: Willis Modena, MD;  Location: [facility] ENDOSCOPY;  Service: Endoscopy;  Laterality: N/A;   ESOPHAGOGASTRODUODENOSCOPY (EGD) WITH PROPOFOL N/A 09/23/2017   Procedure: ESOPHAGOGASTRODUODENOSCOPY (EGD) WITH PROPOFOL;  Surgeon: Graylin Shiver, MD;  [facility];  Service: Endoscopy;  Laterality: N/A;   ESOPHAGOGASTRODUODENOSCOPY (EGD) WITH PROPOFOL N/A 05/19/2019   Procedure:  ESOPHAGOGASTRODUODENOSCOPY (EGD) WITH PROPOFOL;  Surgeon: Kathi Der, MD;  [facility];  Service: Gastroenterology;  Laterality: N/A;   ESOPHAGOGASTRODUODENOSCOPY (EGD) WITH PROPOFOL N/A 10/02/2020   Procedure: ESOPHAGOGASTRODUODENOSCOPY (EGD) WITH PROPOFOL;  Surgeon: Willis Modena, MD;  Location: [facility] ENDOSCOPY;  Service: Endoscopy;  Laterality: N/A;   GIVENS CAPSULE STUDY N/A 05/19/2019   Procedure: GIVENS CAPSULE STUDY;  Surgeon: Kathi Der, MD;  [facility];  Service: Gastroenterology;  Laterality: N/A;   GIVENS CAPSULE STUDY N/A 07/19/2021   Procedure: GIVENS CAPSULE STUDY;  Surgeon: [name], MD;  Location: [facility] ENDOSCOPY;  Service: Gastroenterology;  Laterality: N/A;   HEMOSTASIS CLIP PLACEMENT  09/22/2020   Procedure: HEMOSTASIS CLIP PLACEMENT;  Surgeon: Willis Modena, MD;  [facility];  Service: Endoscopy;;   HEMOSTASIS CONTROL  09/22/2020   Procedure: HEMOSTASIS CONTROL;  Surgeon: Willis Modena, MD;  Location: [facility] ENDOSCOPY;  Service: Endoscopy;;   HOT HEMOSTASIS N/A 05/19/2019   Procedure: HOT HEMOSTASIS (ARGON PLASMA COAGULATION/BICAP);  Surgeon: Kathi Der, MD;  Location: [facility] ENDOSCOPY;  Service: Gastroenterology;  Laterality: N/A;   HOT HEMOSTASIS N/A 01/28/2023   Procedure: HOT HEMOSTASIS (ARGON PLASMA COAGULATION/BICAP);  Surgeon: [name], MD;  Location: [facility] ENDOSCOPY;  Service: Gastroenterology;  Laterality: N/A;   INTRAOPERATIVE TRANSTHORACIC ECHOCARDIOGRAM N/A 12/23/2022   Procedure: INTRAOPERATIVE TRANSTHORACIC ECHOCARDIOGRAM;  Surgeon: [name], MD;  [facility] INVASIVE CV LAB;  Service: Cardiovascular;  Laterality: N/A;   LEFT HEART CATH AND CORONARY ANGIOGRAPHY N/A 09/10/2020   Procedure: LEFT HEART CATH AND CORONARY ANGIOGRAPHY;  Surgeon: Yvonne Kendall, MD;  [facility] INVAS[facility] CV [facility];  Service: Cardiovascular;  Laterality: N/A;   PARTIAL KNEE ARTHROPLASTY Left 11/12/2017   Procedure: left unicompartmental  arthroplasty-medial;  Surgeon: Durene Romans, MD;  [facility]: [facility] ORS;  Service: Orthopedics;  Laterality: Left;    RIGHT HEART CATH AND CORONARY/GRAFT ANGIOGRAPHY N/A 06/26/2022   Procedure: RIGHT HEART CATH AND CORONARY/GRAFT ANGIOGRAPHY;  Surgeon: [name], MD;  [facility] CV [facility];  Service: Cardiovascular;  Laterality: N/A;   SUBMUCOSAL INJECTION  09/23/2017   Procedure: SUBMUCOSAL INJECTION;  Surgeon: Graylin Shiver, MD;  Location: [facility];  Service: Endoscopy;;  in colon   TEE WITHOUT CARDIOVERSION N/A 09/13/2020   Procedure: TRANSESOPHAGEAL ECHOCARDIOGRAM (TEE);  Surgeon: Alleen Borne, MD;  Location: [facility] OR;  Service: Open Heart Surgery;  Laterality: N/A;   TUBAL LIGATION  1990   Family History  Family History  Problem Relation Age of Onset   Lung cancer Mother    Diabetes Sister    Heart disease Sister    Asthma Daughter    Arthritis Daughter    Arthritis Daughter    Thyroid cancer Other        1/2 sister   Heart disease Other        1/2 sister  Social History  reports that she quit smoking about 2 years ago. Her smoking use included cigarettes. She started smoking about 42 years ago. She has a 20 pack-year smoking history. She has never used smokeless tobacco. She reports current alcohol use. She reports that she does not use drugs. Allergies  Allergies  Allergen Reactions   3-Methyl-2-Benzothiazolinone Hydrazone Other (See Comments)    Muscle weakness muscle cramping in legs Other reaction(s): Myalgias (Muscle Pain)   Banana Anaphylaxis, Swelling and Other (See Comments)    TONGUE AND MOUTH SWELLING   Black Buyer, retail (Non-Screening) Anaphylaxis and Itching    Walnuts   Hazelnut ([place]) Anaphylaxis    Hazelnuts   Leflunomide And Related Other (See Comments)    Severe Headache   Lisinopril Swelling    Angioedema  Swelling of the face   No [employer]altht[employer] Food Allergies Anaphylaxis    Spicy Mustard 4.7.2021 Pt reports that she  eats Regular Yellow Mustard Swelling and itching of tongue   Other Anaphylaxis, Swelling and Other (See Comments)    Cosentyx= angioedema Mouth itches and tongue swelling Hazel nuts and pecans also Walnuts Renal failure Serotonin syndrome  Tremors Muscle weakness muscle cramping in legs Other reaction(s): Myalgias (Muscle Pain)   Pecan Nut (Diagnostic) Anaphylaxis and Itching    Pecans   Trazodone And Nefazodone Other (See Comments)    Serotonin Syndrome  Tremors   Adalimumab Other (See Comments)    Blisters on abdomen =humira   Escitalopram Oxalate Other (See Comments)    Hand problems - Serotonin Syndrome     Ezetimibe Other (See Comments)    myalgias cramps    Secukinumab Swelling    Cosentyx = angiodema    Statins Other (See Comments)    Leg pains and weakness   Ferrlecit [Na Ferric Gluc Cplx In Sucrose]     Not reaction given from HD   Gabapentin Other (See Comments)    tremors   Nsaids     Avoid due to renal problems    Home medications Prior to Admission medications   Medication Sig Start Date End Date Taking? Authorizing Provider  acetaminophen (TYLENOL) 500 MG tablet Take 1,000 mg by mouth daily as needed (pain).   Yes [provider]  albuterol (PROVENTIL) (2.5 MG/3ML) 0.083% nebulizer solution Take 3 mLs (2.5 mg total) by nebulization every 6 (six) hours as needed for wheezing or shortness of breath. 07/10/21  Yes Hunsucker, Lesia Sago, MD  allopurinol (ZYLOPRIM) 100 MG tablet Take 100 mg by mouth every evening.   Yes [provider]  Artificial Tear Solution (TEARS NATURALE OP) Place 1 drop into both eyes 4 (four) times daily as needed (dry eye).   Yes [provider]  aspirin 81 MG chewable tablet Chew 1 tablet (81 mg total) by mouth every other day. 01/07/23  Yes Monna Fam, MD  busPIRone (BUSPAR) 5 MG tablet Take 5 mg by mouth 3 (three) times daily. 01/01/23 01/01/24 Yes [provider]  carvedilol (COREG) 25 MG tablet  Take 1 tablet (25 mg total) by mouth 2 (two) times daily with a meal. 12/25/22  Yes Janetta Hora, PA-C  cetirizine (ZYRTEC) 10 MG tablet Take 10 mg by mouth daily as needed for allergies.   Yes [provider]  Cholecalciferol (VITAMIN D) 50 MCG (2000 UT) tablet Take 2,000 Units by mouth daily.   Yes [provider]  cinacalcet (SENSIPAR) 30 MG tablet Take 60 mg by mouth every Monday, Wednesday, and Friday with hemodialysis.  Pt states she takes 2 tab on Mon, 3 tab on Wed, and 2 tab on Fri.   Yes [provider]  cloNIDine (CATAPRES - DOSED IN MG/24 HR) 0.3 mg/24hr patch Place 0.3 mg onto the skin every Sunday.   Yes [provider]  diclofenac Sodium (VOLTAREN) 1 % GEL Apply 2 g topically daily as needed (pain).   Yes [provider]  docusate sodium (COLACE) 100 MG capsule Take 100 mg by mouth daily as needed for moderate constipation.   Yes [provider]  fluticasone (FLONASE) 50 MCG/ACT nasal spray Place 1 spray into both nostrils daily as needed for allergies or rhinitis.   Yes [provider]  isosorbide mononitrate (IMDUR) 60 MG 24 hr tablet Take 1 tablet (60 mg total) by mouth daily. 10/27/22  Yes Jake Bathe, MD  LORazepam (ATIVAN) 0.5 MG tablet Take 0.5 mg by mouth 3 (three) times daily.   Yes [provider]  montelukast (SINGULAIR) 10 MG tablet Take 10 mg by mouth daily with supper.    Yes [provider]  multivitamin (RENA-VIT) TABS tablet Take 1 tablet by mouth daily.   Yes [provider]  NIFEdipine (PROCARDIA XL/NIFEDICAL XL) 60 MG 24 hr tablet Take 60 mg by mouth 2 (two) times daily. 10/01/21  Yes [provider]  olmesartan (BENICAR) 40 MG tablet Take 40 mg by mouth every evening. 05/06/19  Yes [provider]  ondansetron (ZOFRAN) 4 MG tablet Take 4 mg by mouth 2 (two) times daily as needed for nausea or vomiting.   Yes [provider]  OXYGEN Inhale 2-3 L  into the lungs continuous.   Yes [provider]  pantoprazole (PROTONIX) 40 MG tablet Take 40 mg by mouth 2 (two) times daily. 06/09/20  Yes [provider]  predniSONE (DELTASONE) 5 MG tablet Take 1 tablet (5 mg total) by mouth daily with breakfast. 02/27/22  Yes Marrianne Mood, MD  Probiotic Product (PROBIOTIC PO) Take 1 capsule by mouth daily.   Yes [provider]  silver sulfADIAZINE (SILVADENE) 1 % cream Apply 1 Application topically daily as needed (to prevent wound).   Yes [provider]  Tiotropium Bromide-Olodaterol (STIOLTO RESPIMAT) 2.5-2.5 MCG/ACT AERS Inhale 2 puffs once daily into the lungs. 11/28/21  Yes Hunsucker, Lesia Sago, MD  VENTOLIN HFA 108 (90 Base) MCG/ACT inhaler Inhale 2 puffs into the lungs See admin instructions. Take 2 puffs every 4-6 hours as needed for wheezing and shortness of breath 04/08/22  Yes Hunsucker, Lesia Sago, MD  zolpidem (AMBIEN) 5 MG tablet Take 5 mg by mouth at bedtime as needed for sleep. 02/04/21  Yes [provider]  amoxicillin (AMOXIL) 500 MG capsule Take 4 capsules (2,000 mg total) by mouth as directed. Take 4 tablets 1 hour prior to dental work, including cleanings. Patient not taking: Reported on 03/28/2023 03/19/23   Georgie Chard D, NP  EPINEPHrine 0.3 mg/0.3 mL IJ SOAJ injection Inject into the muscle as needed for anaphylaxis. Patient not taking: Reported on 03/28/2023 07/12/21   [provider]  hydrocortisone 2.5%-nystatin-zinc oxide 20% 1:1:1 ointment mixture Apply topically daily as needed. Patient not taking: Reported on 03/28/2023 01/07/23   Monna Fam, MD     Vitals:   03/29/23 0800 03/29/23 0915 03/29/23 0939 03/29/23 1002  BP: (!) 113/48 (!) 113/48 (!) 113/48 (!) 112/51  Pulse:  64  63  Resp: (!) 21 19 (!) 23 (!) 21  Temp:  98.2 F (36.8 C) 98.4 F (  36.9 C)   TempSrc:      SpO2:   100% 100%  Weight:      Height:       Exam Gen lethargic, will not head to simple  questions but is mostly asleep Normal skin turgor Sclera anicteric, throat clear  JVP low to mid neck at 45 deg Chest clear bilat to bases, no rales/ wheezing RRR no MRG Abd soft ntnd no mass or ascites +bs GU defer MS no joint effusions or deformity Ext no LE or UE edema, no other edema Neuro is alert, Ox 3 , nf    L AVF+bruit    Renal-related home meds: - coreg 25 bid - sensipar 60mg  mwf w/ hd - catapres patch 0.3 weekly - procardia xl 60 bid - olmesartan 40 hs - renavite - home O2 2-3 L - others: stiolto respimat, pred 5, PPI, singulair, imdur, ativan, colace, zyloprim, asa, buspar    OP HD: MWF [facility] on [address] From nov 2024 --> 3.5h  58.5kg  B300  heparin none  L AVF   BP 90s- 110/ 45- 65, HR 55- 65, RR 15-22  temp 99.1 wbc 7K  RA 100%    Na 133  K 3.4  BUN 12  creat 4.6  Hb 7.5  TSH 11   CXR poss vasc congestion, looks similar to baseline nov 2024  Assessment/ Plan: Acute metobolic encephalopathy - possibly polypharmacy. Doubt uremia because her BUN/ creat are very low and has not missed HD. Per pmd.  ESRD - on HD MWF. Has not missed HD. HD tomorrow.  HTN - is on 4 bp lowering meds at home, BP's here are soft. Hold home meds.  Volume - no sig edema, CXR borderline congestion. Will get dry wt tomorrow.  Anemia of esrd - Hb 7.5 then 6.1 this am. 2u prbc's ordered. Get OP records.  Secondary hyperparathyroidism - CCa in range, add on phos. Get binder info.  COPD - on 2-3 L [place] O2 at home      Vinson Moselle  MD [facility] 03/29/2023, 11:22 AM  Recent Labs  Lab 03/28/23 1745 03/29/23 0519 03/29/23 0905  HGB 7.5* 6.1* 7.3*  ALBUMIN 2.5* 3.0*  --   CALCIUM 8.8* 8.6*  --   CREATININE 4.69* 5.37*  --   K 3.4* 3.2*  --    Inpatient medications:  sodium chloride   Intravenous Once   [START ON 03/30/2023] allopurinol  100 mg Oral Q M,W,F-HD   arformoterol  15 mcg Nebulization BID   And   umeclidinium bromide  1 puff Inhalation Daily   busPIRone  5 mg Oral TID    cholecalciferol  2,000 Units Oral Daily   [START ON 03/30/2023] cinacalcet  60 mg Oral Once per day on Monday Friday   And   [START ON 04/01/2023] cinacalcet  90 mg Oral Every Wednesday   hydrocortisone sod succinate (SOLU-CORTEF) inj  100 mg Intravenous Q12H   pantoprazole (PROTONIX) IV  40 mg Intravenous Q12H   sodium chloride flush  3 mL Intravenous Q12H   sodium chloride flush  3 mL Intravenous Q12H    sodium chloride     dextrose 5% lactated ringers 75 mL/hr at 03/29/23 0928   sodium chloride, acetaminophen **OR** acetaminophen, albuterol, ondansetron **OR** ondansetron (ZOFRAN) IV, sodium chloride flush

## 2023-03-29 NOTE — Progress Notes (Signed)
 PT Cancellation Note  Patient Details Name: Jodi Bell MRN: [id number] DOB: 06-15-1960   Cancelled Treatment:    Reason Eval/Treat Not Completed: Medical issues which prohibited therapy. PT eval attempted. RN preparing to transfuse 2nd unit of blood.   Ilda Foil 03/29/2023, 2:04 PM

## 2023-03-30 DIAGNOSIS — G9341 Metabolic encephalopathy: Secondary | ICD-10-CM | POA: Diagnosis not present

## 2023-03-30 LAB — CBC WITH DIFFERENTIAL/PLATELET
Abs Immature Granulocytes: 0.03 10*3/uL (ref 0.00–0.07)
Basophils Absolute: 0 10*3/uL (ref 0.0–0.1)
Basophils Relative: 0 %
Eosinophils Absolute: 0 10*3/uL (ref 0.0–0.5)
Eosinophils Relative: 0 %
HCT: 32.8 % — ABNORMAL LOW (ref 36.0–46.0)
Hemoglobin: 9.8 g/dL — ABNORMAL LOW (ref 12.0–15.0)
Immature Granulocytes: 0 %
Lymphocytes Relative: 3 %
Lymphs Abs: 0.2 10*3/uL — ABNORMAL LOW (ref 0.7–4.0)
MCH: 30.3 pg (ref 26.0–34.0)
MCHC: 29.9 g/dL — ABNORMAL LOW (ref 30.0–36.0)
MCV: 101.5 fL — ABNORMAL HIGH (ref 80.0–100.0)
Monocytes Absolute: 0.1 10*3/uL (ref 0.1–1.0)
Monocytes Relative: 1 %
Neutro Abs: 7.9 10*3/uL — ABNORMAL HIGH (ref 1.7–7.7)
Neutrophils Relative %: 96 %
Platelets: 87 10*3/uL — ABNORMAL LOW (ref 150–400)
RBC: 3.23 MIL/uL — ABNORMAL LOW (ref 3.87–5.11)
RDW: 23.2 % — ABNORMAL HIGH (ref 11.5–15.5)
WBC: 8.3 10*3/uL (ref 4.0–10.5)
nRBC: 0 % (ref 0.0–0.2)

## 2023-03-30 LAB — GLUCOSE, CAPILLARY
Glucose-Capillary: 154 mg/dL — ABNORMAL HIGH (ref 70–99)
Glucose-Capillary: 156 mg/dL — ABNORMAL HIGH (ref 70–99)
Glucose-Capillary: 85 mg/dL (ref 70–99)
Glucose-Capillary: 89 mg/dL (ref 70–99)

## 2023-03-30 LAB — BPAM RBC
Blood Product Expiration Date: 202503162359
Blood Product Expiration Date: 202503172359
ISSUE DATE / TIME: 202502160916
ISSUE DATE / TIME: 202502161251
Unit Type and Rh: 8400
Unit Type and Rh: 8400

## 2023-03-30 LAB — BASIC METABOLIC PANEL
Anion gap: 13 (ref 5–15)
BUN: 19 mg/dL (ref 8–23)
CO2: 21 mmol/L — ABNORMAL LOW (ref 22–32)
Calcium: 9.5 mg/dL (ref 8.9–10.3)
Chloride: 96 mmol/L — ABNORMAL LOW (ref 98–111)
Creatinine, Ser: 6.28 mg/dL — ABNORMAL HIGH (ref 0.44–1.00)
GFR, Estimated: 7 mL/min — ABNORMAL LOW (ref >60)
Glucose, Bld: 170 mg/dL — ABNORMAL HIGH (ref 70–99)
Potassium: 4.3 mmol/L (ref 3.5–5.1)
Sodium: 130 mmol/L — ABNORMAL LOW (ref 135–145)

## 2023-03-30 LAB — TYPE AND SCREEN
ABO/RH(D): AB POS
Antibody Screen: NEGATIVE
Unit division: 0
Unit division: 0

## 2023-03-30 LAB — MAGNESIUM: Magnesium: 1.9 mg/dL (ref 1.7–2.4)

## 2023-03-30 MED ORDER — ANTICOAGULANT SODIUM CITRATE 4% (200MG/5ML) IV SOLN: 5.0000 mL | Status: DC | PRN

## 2023-03-30 MED ORDER — LIDOCAINE HCL (PF) 1 % IJ SOLN: 5.0000 mL | INTRAMUSCULAR | Status: DC | PRN

## 2023-03-30 MED ORDER — PREDNISONE 20 MG PO TABS
20.0000 mg | ORAL_TABLET | Freq: Every day | ORAL | Status: DC
  Administered 2023-03-31 – 2023-04-01 (×2): 20 mg via ORAL
  Filled 2023-03-30 (×2): qty 1

## 2023-03-30 MED ORDER — BLISTEX MEDICATED EX OINT
TOPICAL_OINTMENT | CUTANEOUS | Status: DC | PRN
  Filled 2023-03-30: qty 7

## 2023-03-30 MED ORDER — HYDROXYZINE HCL 25 MG PO TABS
25.0000 mg | ORAL_TABLET | Freq: Once | ORAL | Status: AC
  Administered 2023-03-30: 25 mg via ORAL
  Filled 2023-03-30: qty 1

## 2023-03-30 MED ORDER — LIDOCAINE-PRILOCAINE 2.5-2.5 % EX CREA: 1.0000 | TOPICAL_CREAM | CUTANEOUS | Status: DC | PRN

## 2023-03-30 MED ORDER — ALTEPLASE 2 MG IJ SOLR: 2.0000 mg | Freq: Once | INTRAMUSCULAR | Status: DC | PRN

## 2023-03-30 MED ORDER — HEPARIN SODIUM (PORCINE) 1000 UNIT/ML DIALYSIS: 1000.0000 [IU] | INTRAMUSCULAR | Status: DC | PRN

## 2023-03-30 MED ORDER — NEPRO/CARBSTEADY PO LIQD: 237.0000 mL | ORAL | Status: DC | PRN

## 2023-03-30 MED ORDER — PENTAFLUOROPROP-TETRAFLUOROETH EX AERO: 1.0000 | INHALATION_SPRAY | CUTANEOUS | Status: DC | PRN

## 2023-03-30 MED ORDER — CARVEDILOL 12.5 MG PO TABS
12.5000 mg | ORAL_TABLET | Freq: Two times a day (BID) | ORAL | Status: DC
  Administered 2023-03-30 – 2023-03-31 (×3): 12.5 mg via ORAL
  Filled 2023-03-30 (×3): qty 1

## 2023-03-30 NOTE — Progress Notes (Signed)
 [facility] Progress Note   Subjective: Seen on HD. Oriented to place and self. She says she took 1/2 ativan tablet for anxiety. Interestingly she says that usually only "that gabapentin does this to me".  C/O pain L lower back. Tolerating HD.      Objective Vitals:   03/30/23 0744 03/30/23 0835 03/30/23 0842 03/30/23 0850  BP: (!) 152/57 (!) 151/60  134/61  Pulse: 64 (!) 59  (!) 58  Resp: 15 15  16   Temp: (!) 97.5 F (36.4 C) 98.2 F (36.8 C)    TempSrc: Oral     SpO2: 90% 100%  100%  Weight:   69.7 kg   Height:       Physical Exam General: Pleasant female who looks much older that stated age Neuro: responding to questions appropriately. A & O X 2.  Heart: S1,S2 RRR SR on monitor.  Lungs: CTAB no WOB Abdomen:NABS, NT Extremities: No LE edema Dialysis Access: L AVF cannulated   Additional Objective Labs: Basic Metabolic Panel: Recent Labs  Lab 03/28/23 1745 03/29/23 0519 03/30/23 0509  NA 133* 132* 130*  K 3.4* 3.2* 4.3  CL 98 98 96*  CO2 24 26 21*  GLUCOSE 89 106* 170*  BUN 12 14 19   CREATININE 4.69* 5.37* 6.28*  CALCIUM 8.8* 8.6* 9.5  PHOS  --  3.7  --    Liver Function Tests: Recent Labs  Lab 03/28/23 1745 03/29/23 0519  AST 17 12*  ALT 9 8  ALKPHOS 52 40  BILITOT 0.6 0.7  PROT 5.3* 5.4*  ALBUMIN 2.5* 3.0*   No results for input(s): "LIPASE", "AMYLASE" in the last 168 hours. CBC: Recent Labs  Lab 03/28/23 1745 03/29/23 0519 03/29/23 0905 03/29/23 2037 03/30/23 0509  WBC 7.5 7.4  --   --  8.3  NEUTROABS 5.4  --   --   --  7.9*  HGB 7.5* 6.1* 7.3* 9.1* 9.8*  HCT 26.8* 21.7* 26.1* 30.8* 32.8*  MCV 117.0* 114.2*  --   --  101.5*  PLT 110* 90*  --   --  87*   Blood Culture    Component Value Date/Time   SDES BLOOD RIGHT ARM 02/18/2023 0915   SPECREQUEST  02/18/2023 0915    BOTTLES DRAWN AEROBIC AND ANAEROBIC Blood Culture adequate volume   CULT  02/18/2023 0915    NO GROWTH 5 DAYS Performed at [facility],  [address], [place], [place] [address]    REPTSTATUS 02/23/2023 FINAL 02/18/2023 0915    Cardiac Enzymes: No results for input(s): "CKTOTAL", "CKMB", "CKMBINDEX", "TROPONINI" in the last 168 hours. CBG: Recent Labs  Lab 03/29/23 1544 03/29/23 2003 03/29/23 2302 03/30/23 0333 03/30/23 0743  GLUCAP 107* 157* 192* 154* 156*   Iron Studies: No results for input(s): "IRON", "TIBC", "TRANSFERRIN", "FERRITIN" in the last 72 hours. @lablastinr3 @ Studies/Results: DG Chest Port 1 View Result Date: 03/28/2023 CLINICAL DATA:  weakness EXAM: PORTABLE CHEST 1 VIEW COMPARISON:  Chest x-ray 01/02/2023, CT cardiac 03/10/2023 FINDINGS: Persistent cardiomegaly. The heart and mediastinal contours are unchanged. Atherosclerotic plaque. Aortic valve replacement. Coronary artery calcification. Mitral annular calcification. Prominent hilar vasculature. No focal consolidation. No over pulmonary edema. No pleural effusion. No pneumothorax. No acute osseous abnormality.  Intact sternotomy wires noted. IMPRESSION: 1. Cardiomegaly with pulmonary venous congestion. 2. Aortic Atherosclerosis (ICD10-I70.0) including coronary artery and mitral annular calcification. Electronically Signed   By: Tish Frederickson M.D.   On: 03/28/2023 17:17   CT Head Wo Contrast Result  Date: 03/28/2023 CLINICAL DATA:  Facial paralysis/weakness (CN 7) possible cva not acute EXAM: CT HEAD WITHOUT CONTRAST TECHNIQUE: Contiguous axial images were obtained from the base of the skull through the vertex without intravenous contrast. RADIATION DOSE REDUCTION: This exam was performed according to the departmental dose-optimization program which includes automated exposure control, adjustment of the mA and/or kV according to patient size and/or use of iterative reconstruction technique. COMPARISON:  CT head 01/02/2023, MRI head 05/02/2021 FINDINGS: Brain: Patchy and confluent areas of decreased attenuation are noted throughout the deep and periventricular  white matter of the cerebral hemispheres bilaterally, compatible with chronic microvascular ischemic disease. No evidence of large-territorial acute infarction. No parenchymal hemorrhage. No mass lesion. No extra-axial collection. No mass effect or midline shift. No hydrocephalus. Basilar cisterns are patent. Vascular: No hyperdense vessel. Atherosclerotic calcifications are present within the cavernous internal carotid arteries. Skull: No acute fracture or focal lesion. Sinuses/Orbits: Paranasal sinuses and mastoid air cells are clear. Chronic mild proptosis bilaterally. Bilateral lens replacement. Otherwise the orbits are unremarkable. Other: None. IMPRESSION: 1. No acute intracranial abnormality. 2. Chronic mild proptosis bilaterally. Consider ophthalmology consultation. Electronically Signed   By: Tish Frederickson M.D.   On: 03/28/2023 17:05   Medications:  anticoagulant sodium citrate     dextrose 20 mL/hr at 03/30/23 0101    sodium chloride   Intravenous Once   allopurinol  100 mg Oral Q M,W,F-HD   arformoterol  15 mcg Nebulization BID   And   umeclidinium bromide  1 puff Inhalation Daily   busPIRone  5 mg Oral TID   Chlorhexidine Gluconate Cloth  6 each Topical Q0600   cholecalciferol  2,000 Units Oral Daily   cinacalcet  60 mg Oral Once per day on Monday Friday   And   [START ON 04/01/2023] cinacalcet  90 mg Oral Every Wednesday   hydrocortisone sod succinate (SOLU-CORTEF) inj  100 mg Intravenous Q12H   pantoprazole (PROTONIX) IV  40 mg Intravenous Q12H   sodium chloride flush  3 mL Intravenous Q12H   sodium chloride flush  3 mL Intravenous Q12H      OP HD: MWF [facility] on [address] 3.5h  58.5kg  B300  heparin none  L AVF - spoke w/ HD RN, pt has good compliance, doesn't skip or s/o early   Assessment/ Plan: Acute metobolic encephalopathy - possibly polypharmacy. Doubt uremia because her BUN/ creat are very low and has not missed HD. Tells me that she took ativan.Per pmd.   Possible GIB- WU per primary/GI. S/P PRBCS as noted below.  ESRD - on HD MWF. Has not missed HD. HD today on schedule. Next HD 04/01/2023 HTN - is on 4 bp lowering meds at home, BP's here are soft. Hold home meds.  Volume - no sig edema, CXR borderline congestion. Will get dry wt tomorrow.  Anemia of esrd - Hb 7.5 on admission then 6.1 03/29/2023.  S/P 2u prbc's/ HGB 9.8 this AM.  Secondary hyperparathyroidism - CCa in range, add on phos. Get binder info.  COPD - on 2-3 L Edgar O2 at home GAD-Ativan stopped per primary   Azzure Garabedian H. Odette Watanabe NP-C 03/30/2023, 9:07 AM  [employer] [phone]

## 2023-03-30 NOTE — Progress Notes (Signed)
 PT Cancellation Note  Patient Details Name: Jodi Bell MRN: [id number] DOB: 1960/09/24   Cancelled Treatment:    Reason Eval/Treat Not Completed: Patient at procedure or test/unavailable (HD)  Lyanne Co, PT  Acute Rehab Services Secure chat preferred Office [phone]    Elyse Hsu 03/30/2023, 8:29 AM

## 2023-03-30 NOTE — Progress Notes (Addendum)
 Received patient in bed to unit.  Alert and oriented.  Informed consent signed and in chart.   TX duration:3 hours  Patient tolerated well.  Transported back to the room  Alert, without acute distress.  Hand-off given to patient's nurse.   Access used: Left Upper arm Fistula Access issues: none  Total UF removed: 3L Medication(s) given: Calcitriol, Allopurinol   03/30/23 1200  Vitals  Temp 99.1 F (37.3 C)  Temp Source Oral  BP (!) 150/57  BP Location Right Arm  BP Method Automatic  Patient Position (if appropriate) Lying  Pulse Rate 65  Pulse Rate Source Monitor  Resp 20  Oxygen Therapy  SpO2 100 %  O2 Device Nasal Cannula  O2 Flow Rate (L/min) 3 L/min  Patient Activity (if Appropriate) In bed  Pulse Oximetry Type Continuous  During Treatment Monitoring  Duration of HD Treatment -hour(s) 3 hour(s)  Cumulative Fluid Removed (mL) per Treatment  3000.42  HD Safety Checks Performed Yes  Intra-Hemodialysis Comments Tx completed  Dialysis Fluid Bolus Normal Saline  Bolus Amount (mL) 300 mL  Post Treatment  Dialyzer Clearance Clear  Liters Processed 54  Fluid Removed (mL) 3000 mL  Tolerated HD Treatment Yes  AVG/AVF Arterial Site Held (minutes) 7 minutes  AVG/AVF Venous Site Held (minutes) 7 minutes  Fistula / Graft Left Upper arm  Placement Date/Time: 05/17/18 0800   Orientation: Left  Access Location: Upper arm  Status Deaccessed     Stacie Glaze LPN [facility]

## 2023-03-30 NOTE — Plan of Care (Signed)

## 2023-03-30 NOTE — Progress Notes (Signed)
 OT Cancellation Note  Patient Details Name: Jodi Bell MRN: [id number] DOB: 1960-07-26   Cancelled Treatment:    Reason Eval/Treat Not Completed: Patient at procedure or test/ unavailable (HD)  Mateo Flow 03/30/2023, 8:26 AM

## 2023-03-30 NOTE — Progress Notes (Signed)
 Transition of Care [facility]) - Inpatient Brief Assessment   Patient Details  Name: Jodi Bell MRN: [id number] Date of Birth: [birth year]  Transition of Care [facility]) CM/SW Contact:    Ronny Bacon, RN Phone Number: 03/30/2023, 4:17 PM   Clinical Narrative: Patient has home 02. Orientation is improving. Had dialysis today.   Transition of Care Asessment: Insurance and Status: (P) Insurance coverage has been reviewed Patient has primary care physician: (P) Yes Home environment has been reviewed: (P) Home Prior level of function:: (P) independent Prior/Current Home Services: (P) No current home services (Last used [facility] 12/16/2021) Social Drivers of Health Review: (P) SDOH reviewed no interventions necessary Readmission risk has been reviewed: (P) Yes Transition of care needs: (P) no transition of care needs at this time

## 2023-03-30 NOTE — Plan of Care (Signed)
  Problem: Education: Goal: Knowledge of General Education information will improve Description: Including pain rating scale, medication(s)/side effects and non-pharmacologic comfort measures Outcome: Progressing   Problem: Clinical Measurements: Goal: Ability to maintain clinical measurements within normal limits will improve Outcome: Progressing Goal: Respiratory complications will improve Outcome: Progressing Goal: Cardiovascular complication will be avoided Outcome: Progressing   Problem: Activity: Goal: Risk for activity intolerance will decrease Outcome: Progressing   Problem: Nutrition: Goal: Adequate nutrition will be maintained Outcome: Progressing   Problem: Coping: Goal: Level of anxiety will decrease Outcome: Progressing   Problem: Pain Managment: Goal: General experience of comfort will improve and/or be controlled Outcome: Progressing   Problem: Safety: Goal: Ability to remain free from injury will improve Outcome: Progressing   Problem: Skin Integrity: Goal: Risk for impaired skin integrity will decrease Outcome: Progressing

## 2023-03-30 NOTE — Progress Notes (Signed)
 Subjective: Patient was seen and examined during hemodialysis. She has not had a bowel movement since admission yesterday. In the ER she noticed very small amount of bright red blood on toilet paper and not in stool which has not recurred. She complains of severe left upper abdominal pain which radiates to her left back for the last 1 week, worse with even the slightest movement and touch.  Objective: Vital signs in last 24 hours: Temp:  [96.4 F (35.8 C)-98.6 F (37 C)] 98.2 F (36.8 C) (02/17 0835) Pulse Rate:  [58-68] 63 (02/17 0930) Resp:  [13-29] 23 (02/17 0930) BP: (101-152)/(46-71) 140/54 (02/17 0930) SpO2:  [90 %-100 %] 100 % (02/17 0930) Weight:  [61.7 kg-69.7 kg] 69.7 kg (02/17 0842) Weight change: 3.7 kg    PE: Ill-appearing GENERAL: Prominent pallor  ABDOMEN: Severe tenderness noted in left upper quadrant EXTREMITIES: No deformity  Lab Results: Results for orders placed or performed during the hospital encounter of 03/28/23 (from the past 48 hours)  CBC with Differential     Status: Abnormal   Collection Time: 03/28/23  5:45 PM  Result Value Ref Range   WBC 7.5 4.0 - 10.5 K/uL   RBC 2.29 (L) 3.87 - 5.11 MIL/uL   Hemoglobin 7.5 (L) 12.0 - 15.0 g/dL   HCT 84.1 (L) 66.0 - 63.0 %   MCV 117.0 (H) 80.0 - 100.0 fL    Comment: REPEATED TO VERIFY   MCH 32.8 26.0 - 34.0 pg   MCHC 28.0 (L) 30.0 - 36.0 g/dL   RDW 16.0 (H) 10.9 - 32.3 %   Platelets 110 (L) 150 - 400 K/uL    Comment: REPEATED TO VERIFY   nRBC 0.0 0.0 - 0.2 %   Neutrophils Relative % 72 %   Neutro Abs 5.4 1.7 - 7.7 K/uL   Lymphocytes Relative 8 %   Lymphs Abs 0.6 (L) 0.7 - 4.0 K/uL   Monocytes Relative 5 %   Monocytes Absolute 0.4 0.1 - 1.0 K/uL   Eosinophils Relative 14 %   Eosinophils Absolute 1.1 (H) 0.0 - 0.5 K/uL   Basophils Relative 1 %   Basophils Absolute 0.1 0.0 - 0.1 K/uL   WBC Morphology See Note     Comment: Morphology unremarkable   Smear Review See Note     Comment: Normal Platelet  Morphology   nRBC 0 0 /100 WBC   Abs Immature Granulocytes 0.00 0.00 - 0.07 K/uL   Schistocytes PRESENT    Polychromasia PRESENT    Basophilic Stippling PRESENT     Comment: Performed at [facility], [address], [place], [place] [address]  Comprehensive metabolic panel     Status: Abnormal   Collection Time: 03/28/23  5:45 PM  Result Value Ref Range   Sodium 133 (L) 135 - 145 mmol/L   Potassium 3.4 (L) 3.5 - 5.1 mmol/L   Chloride 98 98 - 111 mmol/L   CO2 24 22 - 32 mmol/L   Glucose, Bld 89 70 - 99 mg/dL    Comment: Glucose reference range applies only to samples taken after fasting for at least 8 hours.   BUN 12 8 - 23 mg/dL   Creatinine, Ser 2.02 (H) 0.44 - 1.00 mg/dL   Calcium 8.8 (L) 8.9 - 10.3 mg/dL   Total Protein 5.3 (L) 6.5 - 8.1 g/dL   Albumin 2.5 (L) 3.5 - 5.0 g/dL   AST 17 15 - 41 U/L   ALT 9 0 - 44 U/L  Alkaline Phosphatase 52 38 - 126 U/L   Total Bilirubin 0.6 0.0 - 1.2 mg/dL   GFR, Estimated 10 (L) >60 mL/min    Comment: (NOTE) Calculated using the CKD-EPI Creatinine Equation (2021)    Anion gap 11 5 - 15    Comment: Performed at [facility], [address], [place], [place] [address]  Protime-INR     Status: Abnormal   Collection Time: 03/28/23  5:45 PM  Result Value Ref Range   Prothrombin Time 16.8 (H) 11.4 - 15.2 seconds   INR 1.3 (H) 0.8 - 1.2    Comment: (NOTE) INR goal varies based on device and disease states. Performed at [facility], [address], [place], [place] [address]   TSH     Status: Abnormal   Collection Time: 03/28/23  5:45 PM  Result Value Ref Range   TSH 11.400 (H) 0.350 - 4.500 uIU/mL    Comment: Performed by a 3rd Generation assay with a functional sensitivity of <=0.01 uIU/mL. Performed at [facility], [address] [place], [place] [address]   Cortisol-am, blood     Status: Abnormal   Collection Time: 03/28/23 10:38 PM  Result Value Ref Range   Cortisol - AM 1.3 (L) 6.7 - 22.6 ug/dL     Comment: Performed at [facility], [address] [place], [place] [address]  CBG monitoring, ED     Status: Abnormal   Collection Time: 03/28/23 10:40 PM  Result Value Ref Range   Glucose-Capillary 108 (H) 70 - 99 mg/dL    Comment: Glucose reference range applies only to samples taken after fasting for at least 8 hours.  Comprehensive metabolic panel     Status: Abnormal   Collection Time: 03/29/23  5:19 AM  Result Value Ref Range   Sodium 132 (L) 135 - 145 mmol/L   Potassium 3.2 (L) 3.5 - 5.1 mmol/L   Chloride 98 98 - 111 mmol/L   CO2 26 22 - 32 mmol/L   Glucose, Bld 106 (H) 70 - 99 mg/dL    Comment: Glucose reference range applies only to samples taken after fasting for at least 8 hours.   BUN 14 8 - 23 mg/dL   Creatinine, Ser 2.95 (H) 0.44 - 1.00 mg/dL   Calcium 8.6 (L) 8.9 - 10.3 mg/dL   Total Protein 5.4 (L) 6.5 - 8.1 g/dL   Albumin 3.0 (L) 3.5 - 5.0 g/dL   AST 12 (L) 15 - 41 U/L   ALT 8 0 - 44 U/L   Alkaline Phosphatase 40 38 - 126 U/L   Total Bilirubin 0.7 0.0 - 1.2 mg/dL   GFR, Estimated 8 (L) >60 mL/min    Comment: (NOTE) Calculated using the CKD-EPI Creatinine Equation (2021)    Anion gap 8 5 - 15    Comment: Performed at [facility], [address], [place], [place] [address]  CBC     Status: Abnormal   Collection Time: 03/29/23  5:19 AM  Result Value Ref Range   WBC 7.4 4.0 - 10.5 K/uL   RBC 1.90 (L) 3.87 - 5.11 MIL/uL   Hemoglobin 6.1 (LL) 12.0 - 15.0 g/dL    Comment: REPEATED TO VERIFY THIS CRITICAL RESULT HAS VERIFIED AND BEEN CALLED TO R SMITH RN BY JALEESA WHITE ON 02 16 2025 AT 0620, AND HAS BEEN READ BACK.     HCT 21.7 (L) 36.0 - 46.0 %   MCV 114.2 (H) 80.0 -  100.0 fL   MCH 32.1 26.0 - 34.0 pg   MCHC 28.1 (L) 30.0 - 36.0 g/dL   RDW 16.1 (H) 09.6 - 04.5 %   Platelets 90 (L) 150 - 400 K/uL    Comment: Immature Platelet Fraction may be clinically indicated, consider ordering this additional test [id number] REPEATED TO VERIFY    nRBC 0.0  0.0 - 0.2 %    Comment: Performed at [facility], [address] [place], [place] [address]  Phosphorus     Status: None   Collection Time: 03/29/23  5:19 AM  Result Value Ref Range   Phosphorus 3.7 2.5 - 4.6 mg/dL    Comment: Performed at [facility], [address], [place], [place] [address]  CBG monitoring, ED     Status: None   Collection Time: 03/29/23  5:23 AM  Result Value Ref Range   Glucose-Capillary 98 70 - 99 mg/dL    Comment: Glucose reference range applies only to samples taken after fasting for at least 8 hours.  Type and screen [facility]     Status: None (Preliminary result)   Collection Time: 03/29/23  6:38 AM  Result Value Ref Range   ABO/RH(D) AB POS    Antibody Screen NEG    Sample Expiration 04/01/2023,2359    Unit Number [id number]    Blood Component Type RED CELLS,LR    Unit division 00    Status of Unit ISSUED    Transfusion Status OK TO TRANSFUSE    Crossmatch Result      Compatible Performed at [facility] Lab, [address] [place], [place] [address]    Unit Number [id number]    Blood Component Type RBC LR PHER1    Unit division 00    Status of Unit ISSUED    Transfusion Status OK TO TRANSFUSE    Crossmatch Result Compatible   Prepare RBC (crossmatch)     Status: None   Collection Time: 03/29/23  6:48 AM  Result Value Ref Range   Order Confirmation      ORDER PROCESSED BY BLOOD BANK Performed at [facility], [address], [place], [place] [address]   T4, free     Status: None   Collection Time: 03/29/23  9:05 AM  Result Value Ref Range   Free T4 0.71 0.61 - 1.12 ng/dL    Comment: (NOTE) Biotin ingestion may interfere with free T4 tests. If the results are inconsistent with the TSH level, previous test results, or the clinical presentation, then consider biotin interference. If needed, order repeat testing after stopping biotin. Performed at [facility], [address]  [place], [place] [address]   Hemoglobin and hematocrit, blood     Status: Abnormal   Collection Time: 03/29/23  9:05 AM  Result Value Ref Range   Hemoglobin 7.3 (L) 12.0 - 15.0 g/dL   HCT 56.3 (L) 87.5 - 64.3 %    Comment: Performed at [facility], [address] [place], [place] [address]  Hepatitis B surface antigen     Status: None   Collection Time: 03/29/23  9:05 AM  Result Value Ref Range   Hepatitis B Surface Ag NON REACTIVE NON REACTIVE    Comment: Performed at [facility], [address] [place], [place] [address]  CBG monitoring, ED     Status: Abnormal   Collection Time: 03/29/23  1:41 PM  Result Value Ref Range  Glucose-Capillary 106 (H) 70 - 99 mg/dL    Comment: Glucose reference range applies only to samples taken after fasting for at least 8 hours.   Comment 1 Notify RN    Comment 2 Document in Chart   CBG monitoring, ED     Status: Abnormal   Collection Time: 03/29/23  3:44 PM  Result Value Ref Range   Glucose-Capillary 107 (H) 70 - 99 mg/dL    Comment: Glucose reference range applies only to samples taken after fasting for at least 8 hours.   Comment 1 Notify RN    Comment 2 Document in Chart   Glucose, capillary     Status: Abnormal   Collection Time: 03/29/23  8:03 PM  Result Value Ref Range   Glucose-Capillary 157 (H) 70 - 99 mg/dL    Comment: Glucose reference range applies only to samples taken after fasting for at least 8 hours.  Hemoglobin and hematocrit, blood     Status: Abnormal   Collection Time: 03/29/23  8:37 PM  Result Value Ref Range   Hemoglobin 9.1 (L) 12.0 - 15.0 g/dL    Comment: REPEATED TO VERIFY POST TRANSFUSION SPECIMEN    HCT 30.8 (L) 36.0 - 46.0 %    Comment: Performed at [facility], [address], [place], [place] [address]  Glucose, capillary     Status: Abnormal   Collection Time: 03/29/23 11:02 PM  Result Value Ref Range   Glucose-Capillary 192 (H) 70 - 99 mg/dL    Comment: Glucose reference range applies  only to samples taken after fasting for at least 8 hours.  Glucose, capillary     Status: Abnormal   Collection Time: 03/30/23  3:33 AM  Result Value Ref Range   Glucose-Capillary 154 (H) 70 - 99 mg/dL    Comment: Glucose reference range applies only to samples taken after fasting for at least 8 hours.  Basic metabolic panel     Status: Abnormal   Collection Time: 03/30/23  5:09 AM  Result Value Ref Range   Sodium 130 (L) 135 - 145 mmol/L   Potassium 4.3 3.5 - 5.1 mmol/L   Chloride 96 (L) 98 - 111 mmol/L   CO2 21 (L) 22 - 32 mmol/L   Glucose, Bld 170 (H) 70 - 99 mg/dL    Comment: Glucose reference range applies only to samples taken after fasting for at least 8 hours.   BUN 19 8 - 23 mg/dL   Creatinine, Ser 6.04 (H) 0.44 - 1.00 mg/dL   Calcium 9.5 8.9 - 54.0 mg/dL   GFR, Estimated 7 (L) >60 mL/min    Comment: (NOTE) Calculated using the CKD-EPI Creatinine Equation (2021)    Anion gap 13 5 - 15    Comment: Performed at [facility], [address], [place], [place] [address]  CBC with Differential/Platelet     Status: Abnormal   Collection Time: 03/30/23  5:09 AM  Result Value Ref Range   WBC 8.3 4.0 - 10.5 K/uL   RBC 3.23 (L) 3.87 - 5.11 MIL/uL   Hemoglobin 9.8 (L) 12.0 - 15.0 g/dL   HCT 14.7 (L) 82.9 - 56.2 %   MCV 101.5 (H) 80.0 - 100.0 fL    Comment: DELTA CHECK NOTED   MCH 30.3 26.0 - 34.0 pg   MCHC 29.9 (L) 30.0 - 36.0 g/dL   RDW 13.0 (H) 86.5 - 78.4 %   Platelets 87 (L) 150 - 400 K/uL    Comment: Immature  Platelet Fraction may be clinically indicated, consider ordering this additional test [id number] REPEATED TO VERIFY    nRBC 0.0 0.0 - 0.2 %   Neutrophils Relative % 96 %   Neutro Abs 7.9 (H) 1.7 - 7.7 K/uL   Lymphocytes Relative 3 %   Lymphs Abs 0.2 (L) 0.7 - 4.0 K/uL   Monocytes Relative 1 %   Monocytes Absolute 0.1 0.1 - 1.0 K/uL   Eosinophils Relative 0 %   Eosinophils Absolute 0.0 0.0 - 0.5 K/uL   Basophils Relative 0 %   Basophils Absolute 0.0 0.0  - 0.1 K/uL   Immature Granulocytes 0 %   Abs Immature Granulocytes 0.03 0.00 - 0.07 K/uL    Comment: Performed at [facility], [address] [place], [place] [address]  Magnesium     Status: None   Collection Time: 03/30/23  5:09 AM  Result Value Ref Range   Magnesium 1.9 1.7 - 2.4 mg/dL    Comment: Performed at [facility], [address] [place], [place] [address]  Glucose, capillary     Status: Abnormal   Collection Time: 03/30/23  7:43 AM  Result Value Ref Range   Glucose-Capillary 156 (H) 70 - 99 mg/dL    Comment: Glucose reference range applies only to samples taken after fasting for at least 8 hours.    Studies/Results: DG Chest Port 1 View Result Date: 03/28/2023 CLINICAL DATA:  weakness EXAM: PORTABLE CHEST 1 VIEW COMPARISON:  Chest x-ray 01/02/2023, CT cardiac 03/10/2023 FINDINGS: Persistent cardiomegaly. The heart and mediastinal contours are unchanged. Atherosclerotic plaque. Aortic valve replacement. Coronary artery calcification. Mitral annular calcification. Prominent hilar vasculature. No focal consolidation. No over pulmonary edema. No pleural effusion. No pneumothorax. No acute osseous abnormality.  Intact sternotomy wires noted. IMPRESSION: 1. Cardiomegaly with pulmonary venous congestion. 2. Aortic Atherosclerosis (ICD10-I70.0) including coronary artery and mitral annular calcification. Electronically Signed   By: Tish Frederickson M.D.   On: 03/28/2023 17:17   CT Head Wo Contrast Result Date: 03/28/2023 CLINICAL DATA:  Facial paralysis/weakness (CN 7) possible cva not acute EXAM: CT HEAD WITHOUT CONTRAST TECHNIQUE: Contiguous axial images were obtained from the base of the skull through the vertex without intravenous contrast. RADIATION DOSE REDUCTION: This exam was performed according to the departmental dose-optimization program which includes automated exposure control, adjustment of the mA and/or kV according to patient size and/or use of iterative  reconstruction technique. COMPARISON:  CT head 01/02/2023, MRI head 05/02/2021 FINDINGS: Brain: Patchy and confluent areas of decreased attenuation are noted throughout the deep and periventricular white matter of the cerebral hemispheres bilaterally, compatible with chronic microvascular ischemic disease. No evidence of large-territorial acute infarction. No parenchymal hemorrhage. No mass lesion. No extra-axial collection. No mass effect or midline shift. No hydrocephalus. Basilar cisterns are patent. Vascular: No hyperdense vessel. Atherosclerotic calcifications are present within the cavernous internal carotid arteries. Skull: No acute fracture or focal lesion. Sinuses/Orbits: Paranasal sinuses and mastoid air cells are clear. Chronic mild proptosis bilaterally. Bilateral lens replacement. Otherwise the orbits are unremarkable. Other: None. IMPRESSION: 1. No acute intracranial abnormality. 2. Chronic mild proptosis bilaterally. Consider ophthalmology consultation. Electronically Signed   By: Tish Frederickson M.D.   On: 03/28/2023 17:05    Medications: I have reviewed the patient's current medications.  Assessment: Small-volume hematochezia, could be related to internal hemorrhoids, has resolved Severe left upper abdominal pain radiating to the back and flanks?  Unknown etiology  Altered mental status, resolved, patient currently alert, awake, oriented x  3  End-stage renal disease on hemodialysis, hypertension, history of COPD, history of generalized anxiety disorder  Plan: Will obtain a CAT scan of the abdomen pelvis without contrast as patient complains of severe left upper quadrant tenderness. Hemoglobin was 6.1 and with 2 unit PRBC transfusion has increased to 9.8, no plan for endoscopic intervention. Previously had small bowel endoscopy on 01/28/2023, EGD on 07/23/2021 and colonoscopy on 10/02/2020. Has been started on oral diet and continued on pantoprazole 40 mg every 12 hours. If no  abnormality noted on CAT scan of abdomen and pelvis, GI will sign off. Anemia is likely related to chronic disease, and did not recommend endoscopic intervention for small-volume hematochezia which is likely related to internal hemorrhoids.   Kerin Salen, MD 03/30/2023, 10:14 AM

## 2023-03-30 NOTE — Progress Notes (Signed)
 [facility]  NOTE Jodi Bell ZOX:[id number]  DOB: [birth year]  DOA: 03/28/2023  PCP: Genelle Gather, MD  03/30/2023,2:16 PM  LOS: 1 day   Code Status: Full from: Home current Dispo: Unclear   63 year old black female Known prior upper GI bleed 12/17 through 01/29/2023 admission secondary to angiodysplasia/DL FOA endoscopy Dr. 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 bright red blood per rectum hemoglobin 6.1 GI consulted Nephrology consulted as well for dialysis needs  Plan  Acute toxic metabolic encephalopathy on admission Etiology secondary to hypoglycemic hypotensive + taking Ativan 0.5 mg at home  Resolving fairly well and she is more coherent now  Hypotension on admission Resolved after IVF bolus and IV fluids and midodrine albumin--see below  ?acute GI bleed Prior admission 01/2023 2 angiodysplastic lesions cauterized Transfused 2 units PRBC hemoglobin now 9.8 Aspirin held-Protonix IV twice daily-GI consulted and feel more consistent with hemorrhoidal bleed  ordering CT abdomen pelvis  Anemia of acute blood loss Anemia of renal disease Baseline hemoglobin is 7-8--transfused as above-outpatient workup thrombocytopenia  ESRD MWF Received full HD 12/14-nephrology  consulted At this time holding Sensipar 60 MWF  Chronic COPD on 2 to 3 L at home at baseline Continue oxygen 2 L--CXR on admission some congestion but no real wheeze on exam Saline lock She does use oxygen at home  CAD + CABG 2022 TAVR 12/2022 on aspirin Diastolic heart failure EF 60-65% previously On admission held Coreg 25 twice daily clonidine patch removed Imdur 60 Benicar 40 and aspirin at this time Blood pressure is now elevated resume Coreg dose 12.5 twice daily  Psoriatic arthritis Gout Seems to be on prednisone 5-would hold allopurinol at this time  Altered TSH and Cortisol--?Addisonian 2/2 chronic prednisone? Cut back cortef to prednisone 20 until 2/19 and then back to 5 mg dose T4 is normal despite altered TSH 11.4 probably affected by addisonianism--needs referral to endocrine as outpatient and repeat both TSH T4  Generalized anxiety disorder May continue BuSpar 5 3 times daily stop Ativan    DVT prophylaxis: SCD at this time  Status is: Inpatient Remains inpatient appropriate because:   Requires further workup    Subjective:  Much improved coherent talking full sentences no distress No chest pain just back from dialysis no cough   Objective + exam Vitals:   03/30/23 1130 03/30/23 1200 03/30/23 1202 03/30/23 1220  BP: (!) 141/55 (!) 150/57 (!) 140/59   Pulse: 65 65 69   Resp: 14 20 (!) 23   Temp:  99.1 F (37.3 C)    TempSrc:  Oral    SpO2: 100% 100% 100%   Weight:    58.9 kg  Height:       Filed Weights   03/30/23 0500 03/30/23 0842 03/30/23 1220  Weight: 61.7 kg 61.9 kg 58.9 kg    Examination:  Orientable no icterus no pallor neck soft supple S1-S2 holosystolic murmur Abdomen soft no rebound no guarding She does have a little bit of wheeze No lower extremity edema Power 5/5  Data Reviewed: reviewed   CBC    Component Value Date/Time   WBC 8.3 03/30/2023 0509   RBC 3.23 (L) 03/30/2023 0509   HGB 9.8 (L) 03/30/2023 0509   HCT  32.8 (L) 03/30/2023 0509   PLT 87 (L) 03/30/2023 0509   MCV 101.5 (H) 03/30/2023 0509   MCV 94.5 10/12/2012 1211   MCH 30.3 03/30/2023 0509   MCHC 29.9 (L) 03/30/2023 0509   RDW 23.2 (H) 03/30/2023 0509   LYMPHSABS 0.2 (L) 03/30/2023 0509   MONOABS 0.1 03/30/2023 0509   EOSABS 0.0 03/30/2023 0509   BASOSABS 0.0 03/30/2023 0509      Latest Ref Rng & Units 03/30/2023    5:09 AM 03/29/2023    5:19 AM 03/28/2023    5:45 PM  CMP  Glucose 70 - 99 mg/dL 161  096  89   BUN 8 - 23 mg/dL 19  14  12    Creatinine 0.44 - 1.00 mg/dL 0.45  4.09  8.11   Sodium 135 - 145 mmol/L 130  132  133   Potassium 3.5 - 5.1 mmol/L 4.3  3.2  3.4   Chloride 98 - 111 mmol/L 96  98  98   CO2 22 - 32 mmol/L 21  26  24    Calcium 8.9 - 10.3 mg/dL 9.5  8.6  8.8   Total Protein 6.5 - 8.1 g/dL  5.4  5.3   Total Bilirubin 0.0 - 1.2 mg/dL  0.7  0.6   Alkaline Phos 38 - 126 U/L  40  52   AST 15 - 41 U/L  12  17   ALT 0 - 44 U/L  8  9     Scheduled Meds:  sodium chloride   Intravenous Once   allopurinol  100 mg Oral Q M,W,F-HD   arformoterol  15 mcg Nebulization BID   And   umeclidinium bromide  1 puff Inhalation Daily   busPIRone  5 mg Oral TID   Chlorhexidine Gluconate Cloth  6 each Topical Q0600   cholecalciferol  2,000 Units Oral Daily   cinacalcet  60 mg Oral Once per day on Monday Friday   And   [START ON 04/01/2023] cinacalcet  90 mg Oral Every Wednesday   hydrocortisone sod succinate (SOLU-CORTEF) inj  100 mg Intravenous Q12H   pantoprazole (PROTONIX) IV  40 mg Intravenous Q12H   sodium chloride flush  3 mL Intravenous Q12H   sodium chloride flush  3 mL Intravenous Q12H   Continuous Infusions:    Time 46  Rhetta Mura, MD  [facility]

## 2023-03-31 ENCOUNTER — Inpatient Hospital Stay (HOSPITAL_COMMUNITY): Payer: Medicare PPO

## 2023-03-31 DIAGNOSIS — G9341 Metabolic encephalopathy: Secondary | ICD-10-CM | POA: Diagnosis not present

## 2023-03-31 LAB — BASIC METABOLIC PANEL
Anion gap: 10 (ref 5–15)
BUN: 15 mg/dL (ref 8–23)
CO2: 27 mmol/L (ref 22–32)
Calcium: 8.5 mg/dL — ABNORMAL LOW (ref 8.9–10.3)
Chloride: 94 mmol/L — ABNORMAL LOW (ref 98–111)
Creatinine, Ser: 4.81 mg/dL — ABNORMAL HIGH (ref 0.44–1.00)
GFR, Estimated: 10 mL/min — ABNORMAL LOW (ref >60)
Glucose, Bld: 77 mg/dL (ref 70–99)
Potassium: 3.9 mmol/L (ref 3.5–5.1)
Sodium: 131 mmol/L — ABNORMAL LOW (ref 135–145)

## 2023-03-31 LAB — GLUCOSE, CAPILLARY
Glucose-Capillary: 113 mg/dL — ABNORMAL HIGH (ref 70–99)
Glucose-Capillary: 127 mg/dL — ABNORMAL HIGH (ref 70–99)
Glucose-Capillary: 129 mg/dL — ABNORMAL HIGH (ref 70–99)
Glucose-Capillary: 134 mg/dL — ABNORMAL HIGH (ref 70–99)
Glucose-Capillary: 161 mg/dL — ABNORMAL HIGH (ref 70–99)
Glucose-Capillary: 169 mg/dL — ABNORMAL HIGH (ref 70–99)
Glucose-Capillary: 69 mg/dL — ABNORMAL LOW (ref 70–99)

## 2023-03-31 LAB — CBC WITH DIFFERENTIAL/PLATELET
Abs Immature Granulocytes: 0 10*3/uL (ref 0.00–0.07)
Basophils Absolute: 0.2 10*3/uL — ABNORMAL HIGH (ref 0.0–0.1)
Basophils Relative: 3 %
Eosinophils Absolute: 1 10*3/uL — ABNORMAL HIGH (ref 0.0–0.5)
Eosinophils Relative: 13 %
HCT: 30.3 % — ABNORMAL LOW (ref 36.0–46.0)
Hemoglobin: 9 g/dL — ABNORMAL LOW (ref 12.0–15.0)
Lymphocytes Relative: 9 %
Lymphs Abs: 0.7 10*3/uL (ref 0.7–4.0)
MCH: 29.9 pg (ref 26.0–34.0)
MCHC: 29.7 g/dL — ABNORMAL LOW (ref 30.0–36.0)
MCV: 100.7 fL — ABNORMAL HIGH (ref 80.0–100.0)
Monocytes Absolute: 0.3 10*3/uL (ref 0.1–1.0)
Monocytes Relative: 4 %
Neutro Abs: 5.4 10*3/uL (ref 1.7–7.7)
Neutrophils Relative %: 71 %
Platelets: 109 10*3/uL — ABNORMAL LOW (ref 150–400)
RBC: 3.01 MIL/uL — ABNORMAL LOW (ref 3.87–5.11)
RDW: 22.5 % — ABNORMAL HIGH (ref 11.5–15.5)
WBC: 7.6 10*3/uL (ref 4.0–10.5)
nRBC: 0.3 % — ABNORMAL HIGH (ref 0.0–0.2)
nRBC: 1 /100{WBCs} — ABNORMAL HIGH

## 2023-03-31 LAB — HEPATITIS B SURFACE ANTIBODY, QUANTITATIVE: Hep B S AB Quant (Post): 8 m[IU]/mL — ABNORMAL LOW

## 2023-03-31 MED ORDER — ISOSORBIDE MONONITRATE ER 30 MG PO TB24
60.0000 mg | ORAL_TABLET | Freq: Every day | ORAL | Status: DC
  Administered 2023-03-31: 60 mg via ORAL
  Filled 2023-03-31: qty 2

## 2023-03-31 MED ORDER — CARVEDILOL 25 MG PO TABS
25.0000 mg | ORAL_TABLET | Freq: Two times a day (BID) | ORAL | Status: DC
  Administered 2023-04-01: 25 mg via ORAL
  Filled 2023-03-31: qty 1

## 2023-03-31 MED ORDER — IPRATROPIUM-ALBUTEROL 0.5-2.5 (3) MG/3ML IN SOLN: 3.0000 mL | RESPIRATORY_TRACT | Status: DC | PRN

## 2023-03-31 MED ORDER — NIFEDIPINE ER OSMOTIC RELEASE 60 MG PO TB24
60.0000 mg | ORAL_TABLET | Freq: Two times a day (BID) | ORAL | Status: DC
  Administered 2023-04-01: 60 mg via ORAL
  Filled 2023-03-31 (×3): qty 1

## 2023-03-31 MED ORDER — CLONIDINE HCL 0.3 MG/24HR TD PTWK: 0.3000 mg | MEDICATED_PATCH | TRANSDERMAL | Status: DC

## 2023-03-31 NOTE — Progress Notes (Signed)
 [facility] Progress Note   Subjective: Seen in room, up in chair. No C/Os. Went for CT of abdomen this AM. Scan completed, not read. HD tomorrow on schedule.      Objective Vitals:   03/31/23 0300 03/31/23 0459 03/31/23 0800 03/31/23 1117  BP: (!) 149/57  (!) 160/56 (!) 153/64  Pulse: 61  60 60  Resp: 15  16 14   Temp: 97.8 F (36.6 C)  98.4 F (36.9 C) 98.2 F (36.8 C)  TempSrc: Oral  Oral Oral  SpO2: 100%  100% 100%  Weight:  55.4 kg    Height:       Physical Exam General: Pleasant female who looks much older that stated age Neuro: responding to questions appropriately. A & O X 2.  Heart: S1,S2 RRR SR on monitor.  Lungs: CTAB no WOB Abdomen:NABS, NT Extremities: No LE edema Dialysis Access: L AVF + T/B  Additional Objective Labs: Basic Metabolic Panel: Recent Labs  Lab 03/29/23 0519 03/30/23 0509 03/31/23 0617  NA 132* 130* 131*  K 3.2* 4.3 3.9  CL 98 96* 94*  CO2 26 21* 27  GLUCOSE 106* 170* 77  BUN 14 19 15   CREATININE 5.37* 6.28* 4.81*  CALCIUM 8.6* 9.5 8.5*  PHOS 3.7  --   --    Liver Function Tests: Recent Labs  Lab 03/28/23 1745 03/29/23 0519  AST 17 12*  ALT 9 8  ALKPHOS 52 40  BILITOT 0.6 0.7  PROT 5.3* 5.4*  ALBUMIN 2.5* 3.0*   No results for input(s): "LIPASE", "AMYLASE" in the last 168 hours. CBC: Recent Labs  Lab 03/28/23 1745 03/29/23 0519 03/29/23 0905 03/29/23 2037 03/30/23 0509 03/31/23 0617  WBC 7.5 7.4  --   --  8.3 7.6  NEUTROABS 5.4  --   --   --  7.9* 5.4  HGB 7.5* 6.1*   < > 9.1* 9.8* 9.0*  HCT 26.8* 21.7*   < > 30.8* 32.8* 30.3*  MCV 117.0* 114.2*  --   --  101.5* 100.7*  PLT 110* 90*  --   --  87* 109*   < > = values in this interval not displayed.   Blood Culture    Component Value Date/Time   SDES BLOOD RIGHT ARM 02/18/2023 0915   SPECREQUEST  02/18/2023 0915    BOTTLES DRAWN AEROBIC AND ANAEROBIC Blood Culture adequate volume   CULT  02/18/2023 0915    NO GROWTH 5 DAYS Performed at [facility], [address] [place], [place] [address]    REPTSTATUS 02/23/2023 FINAL 02/18/2023 0915    Cardiac Enzymes: No results for input(s): "CKTOTAL", "CKMB", "CKMBINDEX", "TROPONINI" in the last 168 hours. CBG: Recent Labs  Lab 03/30/23 1937 03/31/23 0329 03/31/23 0827 03/31/23 0921 03/31/23 1116  GLUCAP 89 113* 69* 134* 129*   Iron Studies: No results for input(s): "IRON", "TIBC", "TRANSFERRIN", "FERRITIN" in the last 72 hours. @lablastinr3 @ Studies/Results: No results found. Medications:   sodium chloride   Intravenous Once   allopurinol  100 mg Oral Q M,W,F-HD   arformoterol  15 mcg Nebulization BID   busPIRone  5 mg Oral TID   carvedilol  12.5 mg Oral BID WC   Chlorhexidine Gluconate Cloth  6 each Topical Q0600   cholecalciferol  2,000 Units Oral Daily   cinacalcet  60 mg Oral Once per day on Monday Friday   And   [START ON 04/01/2023] cinacalcet  90 mg Oral Every Wednesday   pantoprazole (PROTONIX) IV  40 mg Intravenous Q12H   predniSONE  20 mg Oral QAC breakfast   sodium chloride flush  3 mL Intravenous Q12H   sodium chloride flush  3 mL Intravenous Q12H     OP HD: MWF [facility] on [address] 3.5h  58.5kg  B300  heparin none  L AVF - spoke w/ HD RN, pt has good compliance, doesn't skip or s/o early   Assessment/ Plan: Acute metobolic encephalopathy - possibly polypharmacy. Doubt uremia because her BUN/ creat are very low and has not missed HD. Tells me that she took ativan.Per pmd.  Possible GIB- WU per primary/GI. S/P PRBCS as noted below.  ESRD - on HD MWF. Has not missed HD. HD today on schedule. Next HD 04/01/2023 HTN - is on 4 bp lowering meds at home, BP's here are soft. Hold home meds.  Volume - no sig edema, CXR borderline congestion which appears to have resolved with HD. No slightly under OP EDW. UF as tolerated.   Anemia of esrd - Hb 7.5 on admission then 6.1 03/29/2023.  S/P 2u prbc's. HGB 9.0 this AM.  Secondary hyperparathyroidism -  CCa in range, add on phos. Get binder info.  COPD - on 2-3 L [place] O2 at home GAD-Ativan stopped per primary   Disposition: Stable for DC from Nephrology stand point.   Jodi Bell H. Yvett Rossel NP-C 03/31/2023, 1:59 PM  [employer] [phone]

## 2023-03-31 NOTE — Progress Notes (Signed)
 Hypoglycemic Event  CBG: 69  Treatment: 8 oz juice/soda  Symptoms: None  Follow-up CBG: Time:0921 CBG Result:134  Possible Reasons for Event: Inadequate meal intake  Comments/MD notified: Dr. Tyson Babinski

## 2023-03-31 NOTE — Evaluation (Signed)
 Physical Therapy Evaluation Patient Details Name: Jodi Bell MRN: [id number] DOB: November 24, 1960 Today's Date: 03/31/2023  History of Present Illness  63 yo female presents 2/15 with weakness workup for GIB and anemia. PMH GIB DM II, CAD s/p CABG in 2022, anxiety, arthritis, COPD, ESRD on HD, GERD, gout, obesity, PAD, and HTN, TAVR  Clinical Impression  Pt admitted with above diagnosis. Pt from home with family, spends some of the day alone while her daughter is at work. Reports that she uses her cane or no AD in house and rollator out of the house. She reports she had a fall at home recently with no AD. Pt's cognition has improved but she endorses continued STM deficits. Ambulated 30' with RW and min A with no LOB but fatigues quickly and needed to sit after this distance. O2 sats remained 99% on 3L O2. Recommend HHPT at d/c.  Pt currently with functional limitations due to the deficits listed below (see PT Problem List). Pt will benefit from acute skilled PT to increase their independence and safety with mobility to allow discharge.           If plan is discharge home, recommend the following: A little help with walking and/or transfers;A little help with bathing/dressing/bathroom;Assistance with cooking/housework;Help with stairs or ramp for entrance;Assist for transportation   Can travel by private vehicle        Equipment Recommendations None recommended by PT  Recommendations for Other Services       Functional Status Assessment Patient has had a recent decline in their functional status and demonstrates the ability to make significant improvements in function in a reasonable and predictable amount of time.     Precautions / Restrictions Precautions Precautions: Fall Recall of Precautions/Restrictions: Intact Precaution/Restrictions Comments: pt recently fell at home Restrictions Weight Bearing Restrictions Per Provider Order: No      Mobility  Bed Mobility                General bed mobility comments: pt up on BSC upon PT arrival    Transfers Overall transfer level: Needs assistance Equipment used: Rolling walker (2 wheels) Transfers: Sit to/from Stand, Bed to chair/wheelchair/BSC Sit to Stand: Contact guard assist   Step pivot transfers: Contact guard assist       General transfer comment: stable with use of RW    Ambulation/Gait Ambulation/Gait assistance: Min assist Gait Distance (Feet): 30 Feet Assistive device: Rolling walker (2 wheels) Gait Pattern/deviations: Step-through pattern, Trunk flexed, Shuffle Gait velocity: decreased Gait velocity interpretation: <1.8 ft/sec, indicate of risk for recurrent falls   General Gait Details: pt with low step height and decreased step length, fearful of falling as she has had falls before. Fatigues quickly with ambulation, needed to sit after 30'. SPO2 remained 99% on 3L O2  Stairs            Wheelchair Mobility     Tilt Bed    Modified Rankin (Stroke Patients Only)       Balance Overall balance assessment: History of Falls, Needs assistance Sitting-balance support: Feet supported, No upper extremity supported Sitting balance-Leahy Scale: Good     Standing balance support: No upper extremity supported, During functional activity Standing balance-Leahy Scale: Poor Standing balance comment: pt braces self against counter when standing to wash hands. Unsteady without external support                             Pertinent Vitals/Pain  Pain Assessment Pain Assessment: Faces Faces Pain Scale: Hurts little more Pain Location: abdomen with palpation from MD Pain Descriptors / Indicators: Discomfort, Grimacing Pain Intervention(s): Limited activity within patient's tolerance, Monitored during session    Home Living Family/patient expects to be discharged to:: Private residence Living Arrangements: Children Available Help at Discharge: Family;Available  PRN/intermittently Type of Home: House Home Access: Level entry     Alternate Level Stairs-Number of Steps: 13 Home Layout: Multi-level;Able to live on main level with bedroom/bathroom Home Equipment: Cane - single point;Rolling Walker (2 wheels);Rollator (4 wheels);Shower seat;Toilet riser;Grab bars - tub/shower;Hand held shower head Additional Comments: occasionally goes upstairs to kitchen, but mainly stays on main floor.Has been weaning herself down from 3L O2 to 2L O2. Daughter lives with her but works and sometimes travels for work. If she is gone one of her other children usually comes to stay    Prior Function Prior Level of Function : Independent/Modified Independent             Mobility Comments: family takes pt to HD, uses cane in home and rollator out of home ADLs Comments: daughter does pt's laundry, sits for showers, reports ind with ADLs but has difficulty finding the energy for it. Does not drive     Extremity/Trunk Assessment   Upper Extremity Assessment Upper Extremity Assessment: Generalized weakness    Lower Extremity Assessment Lower Extremity Assessment: Generalized weakness    Cervical / Trunk Assessment Cervical / Trunk Assessment: Kyphotic  Communication   Communication Communication: No apparent difficulties    Cognition Arousal: Alert Behavior During Therapy: WFL for tasks assessed/performed   PT - Cognitive impairments: Memory                       PT - Cognition Comments: pt appropriate on eval and can give PLOF and home set up. She endorses continued STM deficits though that are below her baseline Following commands: Intact       Cueing Cueing Techniques: Verbal cues     General Comments General comments (skin integrity, edema, etc.): pt reports that she was supposed to get HHPT after last admission but they never came    Exercises     Assessment/Plan    PT Assessment Patient needs continued PT services  PT Problem  List Decreased strength;Decreased activity tolerance;Decreased balance;Decreased mobility;Decreased cognition;Pain       PT Treatment Interventions DME instruction;Gait training;Functional mobility training;Therapeutic activities;Therapeutic exercise;Balance training;Patient/family education;Cognitive remediation    PT Goals (Current goals can be found in the Care Plan section)  Acute Rehab PT Goals Patient Stated Goal: return home before the bad weather PT Goal Formulation: With patient Time For Goal Achievement: 04/14/23 Potential to Achieve Goals: Good    Frequency Min 1X/week     Co-evaluation               AM-PAC PT "6 Clicks" Mobility  Outcome Measure Help needed turning from your back to your side while in a flat bed without using bedrails?: A Little Help needed moving from lying on your back to sitting on the side of a flat bed without using bedrails?: A Little Help needed moving to and from a bed to a chair (including a wheelchair)?: A Little Help needed standing up from a chair using your arms (e.g., wheelchair or bedside chair)?: A Little Help needed to walk in hospital room?: A Little Help needed climbing 3-5 steps with a railing? : A Lot 6 Click Score: 17  End of Session Equipment Utilized During Treatment: Gait belt;Oxygen Activity Tolerance: Patient tolerated treatment well Patient left: in chair;with call bell/phone within reach Nurse Communication: Mobility status PT Visit Diagnosis: Unsteadiness on feet (R26.81);History of falling (Z91.81);Muscle weakness (generalized) (M62.81);Difficulty in walking, not elsewhere classified (R26.2);Pain Pain - part of body:  (abdomen)    Time: 1011-1040 PT Time Calculation (min) (ACUTE ONLY): 29 min   Charges:   PT Evaluation $PT Eval Moderate Complexity: 1 Mod PT Treatments $Gait Training: 8-22 mins PT General Charges $$ ACUTE PT VISIT: 1 Visit         Lyanne Co, PT  Acute Rehab Services Secure  chat preferred Office [phone]   Lawana Chambers Finnley [place] 03/31/2023, 11:16 AM

## 2023-03-31 NOTE — Plan of Care (Signed)
  Problem: Education: Goal: Knowledge of General Education information will improve Description: Including pain rating scale, medication(s)/side effects and non-pharmacologic comfort measures Outcome: Progressing   Problem: Health Behavior/Discharge Planning: Goal: Ability to manage health-related needs will improve Outcome: Progressing   Problem: Clinical Measurements: Goal: Diagnostic test results will improve Outcome: Progressing Goal: Respiratory complications will improve Outcome: Progressing   Problem: Activity: Goal: Risk for activity intolerance will decrease Outcome: Progressing   Problem: Nutrition: Goal: Adequate nutrition will be maintained Outcome: Progressing   Problem: Coping: Goal: Level of anxiety will decrease Outcome: Progressing   Problem: Elimination: Goal: Will not experience complications related to bowel motility Outcome: Progressing

## 2023-03-31 NOTE — Progress Notes (Signed)
 Pt off unit for CT scan.

## 2023-03-31 NOTE — Progress Notes (Signed)
 Subjective: Patient was being wheeled of to CAT scan. Denies further episode of rectal bleeding.  Objective: Vital signs in last 24 hours: Temp:  [97.8 F (36.6 C)-98.4 F (36.9 C)] 98.2 F (36.8 C) (02/18 1117) Pulse Rate:  [60-65] 60 (02/18 1117) Resp:  [14-23] 14 (02/18 1117) BP: (145-160)/(54-83) 153/64 (02/18 1117) SpO2:  [95 %-100 %] 100 % (02/18 1117) Weight:  [55.4 kg] 55.4 kg (02/18 0459) Weight change: 0.2 kg Last BM Date : 03/31/23  PE: Prominent pallor GENERAL: Ill-appearing but not in distress  ABDOMEN: Left upper quadrant tenderness EXTREMITIES: No deformity  Lab Results: Results for orders placed or performed during the hospital encounter of 03/28/23 (from the past 48 hours)  CBG monitoring, ED     Status: Abnormal   Collection Time: 03/29/23  1:41 PM  Result Value Ref Range   Glucose-Capillary 106 (H) 70 - 99 mg/dL    Comment: Glucose reference range applies only to samples taken after fasting for at least 8 hours.   Comment 1 Notify RN    Comment 2 Document in Chart   CBG monitoring, ED     Status: Abnormal   Collection Time: 03/29/23  3:44 PM  Result Value Ref Range   Glucose-Capillary 107 (H) 70 - 99 mg/dL    Comment: Glucose reference range applies only to samples taken after fasting for at least 8 hours.   Comment 1 Notify RN    Comment 2 Document in Chart   Glucose, capillary     Status: Abnormal   Collection Time: 03/29/23  8:03 PM  Result Value Ref Range   Glucose-Capillary 157 (H) 70 - 99 mg/dL    Comment: Glucose reference range applies only to samples taken after fasting for at least 8 hours.  Hemoglobin and hematocrit, blood     Status: Abnormal   Collection Time: 03/29/23  8:37 PM  Result Value Ref Range   Hemoglobin 9.1 (L) 12.0 - 15.0 g/dL    Comment: REPEATED TO VERIFY POST TRANSFUSION SPECIMEN    HCT 30.8 (L) 36.0 - 46.0 %    Comment: Performed at [facility], [address], [place], [place] [address]  Glucose, capillary      Status: Abnormal   Collection Time: 03/29/23 11:02 PM  Result Value Ref Range   Glucose-Capillary 192 (H) 70 - 99 mg/dL    Comment: Glucose reference range applies only to samples taken after fasting for at least 8 hours.  Glucose, capillary     Status: Abnormal   Collection Time: 03/30/23  3:33 AM  Result Value Ref Range   Glucose-Capillary 154 (H) 70 - 99 mg/dL    Comment: Glucose reference range applies only to samples taken after fasting for at least 8 hours.  Basic metabolic panel     Status: Abnormal   Collection Time: 03/30/23  5:09 AM  Result Value Ref Range   Sodium 130 (L) 135 - 145 mmol/L   Potassium 4.3 3.5 - 5.1 mmol/L   Chloride 96 (L) 98 - 111 mmol/L   CO2 21 (L) 22 - 32 mmol/L   Glucose, Bld 170 (H) 70 - 99 mg/dL    Comment: Glucose reference range applies only to samples taken after fasting for at least 8 hours.   BUN 19 8 - 23 mg/dL   Creatinine, Ser 6.04 (H) 0.44 - 1.00 mg/dL   Calcium 9.5 8.9 - 54.0 mg/dL   GFR, Estimated 7 (L) >60 mL/min    Comment: (NOTE) Calculated  using the CKD-EPI Creatinine Equation (2021)    Anion gap 13 5 - 15    Comment: Performed at [facility], [address] [place], [place] [address]  CBC with Differential/Platelet     Status: Abnormal   Collection Time: 03/30/23  5:09 AM  Result Value Ref Range   WBC 8.3 4.0 - 10.5 K/uL   RBC 3.23 (L) 3.87 - 5.11 MIL/uL   Hemoglobin 9.8 (L) 12.0 - 15.0 g/dL   HCT 60.4 (L) 54.0 - 98.1 %   MCV 101.5 (H) 80.0 - 100.0 fL    Comment: DELTA CHECK NOTED   MCH 30.3 26.0 - 34.0 pg   MCHC 29.9 (L) 30.0 - 36.0 g/dL   RDW 19.1 (H) 47.8 - 29.5 %   Platelets 87 (L) 150 - 400 K/uL    Comment: Immature Platelet Fraction may be clinically indicated, consider ordering this additional test [id number] REPEATED TO VERIFY    nRBC 0.0 0.0 - 0.2 %   Neutrophils Relative % 96 %   Neutro Abs 7.9 (H) 1.7 - 7.7 K/uL   Lymphocytes Relative 3 %   Lymphs Abs 0.2 (L) 0.7 - 4.0 K/uL   Monocytes Relative 1 %    Monocytes Absolute 0.1 0.1 - 1.0 K/uL   Eosinophils Relative 0 %   Eosinophils Absolute 0.0 0.0 - 0.5 K/uL   Basophils Relative 0 %   Basophils Absolute 0.0 0.0 - 0.1 K/uL   Immature Granulocytes 0 %   Abs Immature Granulocytes 0.03 0.00 - 0.07 K/uL    Comment: Performed at [facility], [address] [place], [place] [address]  Magnesium     Status: None   Collection Time: 03/30/23  5:09 AM  Result Value Ref Range   Magnesium 1.9 1.7 - 2.4 mg/dL    Comment: Performed at [facility], [address] [place], [place] [address]  Glucose, capillary     Status: Abnormal   Collection Time: 03/30/23  7:43 AM  Result Value Ref Range   Glucose-Capillary 156 (H) 70 - 99 mg/dL    Comment: Glucose reference range applies only to samples taken after fasting for at least 8 hours.  Glucose, capillary     Status: None   Collection Time: 03/30/23  3:50 PM  Result Value Ref Range   Glucose-Capillary 85 70 - 99 mg/dL    Comment: Glucose reference range applies only to samples taken after fasting for at least 8 hours.  Glucose, capillary     Status: None   Collection Time: 03/30/23  7:37 PM  Result Value Ref Range   Glucose-Capillary 89 70 - 99 mg/dL    Comment: Glucose reference range applies only to samples taken after fasting for at least 8 hours.  Glucose, capillary     Status: Abnormal   Collection Time: 03/31/23  3:29 AM  Result Value Ref Range   Glucose-Capillary 113 (H) 70 - 99 mg/dL    Comment: Glucose reference range applies only to samples taken after fasting for at least 8 hours.  Basic metabolic panel     Status: Abnormal   Collection Time: 03/31/23  6:17 AM  Result Value Ref Range   Sodium 131 (L) 135 - 145 mmol/L   Potassium 3.9 3.5 - 5.1 mmol/L   Chloride 94 (L) 98 - 111 mmol/L   CO2 27 22 - 32 mmol/L   Glucose, Bld 77 70 - 99 mg/dL    Comment: Glucose reference range applies  only to samples taken after fasting for at least 8 hours.   BUN 15 8 - 23 mg/dL    Creatinine, Ser 1.61 (H) 0.44 - 1.00 mg/dL   Calcium 8.5 (L) 8.9 - 10.3 mg/dL   GFR, Estimated 10 (L) >60 mL/min    Comment: (NOTE) Calculated using the CKD-EPI Creatinine Equation (2021)    Anion gap 10 5 - 15    Comment: Performed at [facility], [address] [place], [place] [address]  CBC with Differential/Platelet     Status: Abnormal   Collection Time: 03/31/23  6:17 AM  Result Value Ref Range   WBC 7.6 4.0 - 10.5 K/uL   RBC 3.01 (L) 3.87 - 5.11 MIL/uL   Hemoglobin 9.0 (L) 12.0 - 15.0 g/dL   HCT 54.0 (L) 98.1 - 19.1 %   MCV 100.7 (H) 80.0 - 100.0 fL   MCH 29.9 26.0 - 34.0 pg   MCHC 29.7 (L) 30.0 - 36.0 g/dL   RDW 47.8 (H) 29.5 - 62.1 %   Platelets 109 (L) 150 - 400 K/uL   nRBC 0.3 (H) 0.0 - 0.2 %   Neutrophils Relative % 71 %   Neutro Abs 5.4 1.7 - 7.7 K/uL   Lymphocytes Relative 9 %   Lymphs Abs 0.7 0.7 - 4.0 K/uL   Monocytes Relative 4 %   Monocytes Absolute 0.3 0.1 - 1.0 K/uL   Eosinophils Relative 13 %   Eosinophils Absolute 1.0 (H) 0.0 - 0.5 K/uL   Basophils Relative 3 %   Basophils Absolute 0.2 (H) 0.0 - 0.1 K/uL   WBC Morphology See Note     Comment: Morphology unremarkable   Smear Review See Note     Comment: Normal Platelet Morphology   nRBC 1 (H) 0 /100 WBC   Abs Immature Granulocytes 0.00 0.00 - 0.07 K/uL   Schistocytes PRESENT    Polychromasia PRESENT     Comment: Performed at [facility], [name] [place], [place] [address]  Glucose, capillary     Status: Abnormal   Collection Time: 03/31/23  8:27 AM  Result Value Ref Range   Glucose-Capillary 69 (L) 70 - 99 mg/dL    Comment: Glucose reference range applies only to samples taken after fasting for at least 8 hours.  Glucose, capillary     Status: Abnormal   Collection Time: 03/31/23  9:21 AM  Result Value Ref Range   Glucose-Capillary 134 (H) 70 - 99 mg/dL    Comment: Glucose reference range applies only to samples taken after fasting for at least 8 hours.  Glucose, capillary      Status: Abnormal   Collection Time: 03/31/23 11:16 AM  Result Value Ref Range   Glucose-Capillary 129 (H) 70 - 99 mg/dL    Comment: Glucose reference range applies only to samples taken after fasting for at least 8 hours.    Studies/Results: No results found.  Medications: I have reviewed the patient's current medications.  Assessment: Small-volume hematochezia thought to be related to hemorrhoids, hemoglobin stable at 9.1/9.8/9  Acute metabolic encephalopathy-resolved  Severe left upper abdominal pain?  Unknown etiology, CAT scan pending  Plan: Will follow results of CAT scan as patient continues to have left upper quadrant abdominal pain of unknown etiology.  Kerin Salen, MD 03/31/2023, 12:55 PM

## 2023-03-31 NOTE — Progress Notes (Signed)
 RN notified Dr. Tyson Babinski for second time regarding elevated BP, 184/63. Dr. Tyson Babinski stated "resuming some of her home meds". Pt notified. New orders received.

## 2023-03-31 NOTE — Plan of Care (Signed)

## 2023-03-31 NOTE — Hospital Course (Signed)
 Patient is a 63 years old female with past medical history of chronic heart failure with preserved ejection fraction, critical aortic stenosis status post TAVR, COPD on 2 to 3 L of oxygen at home, diabetes mellitus type 2 not on insulin, end-stage renal disease on Monday Wednesday Friday dialysis and history of GI bleed secondary to angiodysplasia presented to the hospital on 03/28/2023 with slurred speech.  Patient was noted to have low blood glucose level at 60 initially.  Blood pressure was low at 90 systolic.  Labs showed potassium low at 3.4 with sodium low at 133.  Creatinine elevated at 4.6.  Hemoglobin was 7.5 with platelets low at 110.  TSH elevated at 11.4.  CT head scan was negative for acute findings.  Chest x-ray showed pulmonary vascular congestion.  Patient developed bright red blood per rectum on 03/29/2023 and GI was consulted.  Assessment and plan.  Acute toxic metabolic encephalopathy  Likely multifactorial from hypoglycemia hypotension and Ativan at home.  Improved at this time   Hypotension on admission Resolved after IVF bolus and IV fluids and midodrine albumin.  Currently has been started on Coreg.   GI bleed during hospitalization. Prior admission 01/2023 with findings of 2 angiodysplastic lesions cauterized at that time aspirin on hold.  Continue Protonix.  Status post units of packed RBC transfusion.  GI has seen the patient and thought to be secondary to internal hemorrhoids.  Has resolved at this time.  Latest hemoglobin of 9.0 from 6.12 days back.  CT scan of the abdomen pelvis was ordered.  Anemia of acute blood loss Anemia of renal disease Baseline hemoglobin is 7-8--transfused as above.  Latest hemoglobin of 9.0   ESRD on hemodialysis. Nephrology on board for hemodialysis needs.   Chronic COPD on 2 to 3 L at home at baseline Continue supplemental oxygen.     CAD status post CABG 2022 TAVR 12/2022 on aspirin Chronic diastolic heart failure EF 60-65%  previously Has been started on Coreg.  Imdur clonidine patch and Benicar on hold   Psoriatic arthritis Gout Seems to be on prednisone 5 milligram.  Allopurinol on hold   Altered TSH and Cortisol--question secondary to chronic prednisone.   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

## 2023-03-31 NOTE — Progress Notes (Signed)
   03/31/23 1742  Vitals  BP (!) 184/63  MAP (mmHg) 97  ECG Heart Rate 62  Resp 13  Level of Consciousness  Level of Consciousness Alert  MEWS COLOR  MEWS Score Color Green  MEWS Score  MEWS Temp 0  MEWS Systolic 0  MEWS Pulse 0  MEWS RR 1  MEWS LOC 0  MEWS Score 1     Dr. Tyson Babinski notified and aware of pt's BP. No PRNs available to administer. Pt is asymptomatic. No new orders received.

## 2023-03-31 NOTE — Progress Notes (Addendum)
 PROGRESS NOTE  Jodi Bell FAO:[id number] DOB: 21-May-1960 DOA: 03/28/2023 PCP: Genelle Gather, MD   LOS: 2 days   Brief narrative:  Patient is a 63 years old female with past medical history of chronic heart failure with preserved ejection fraction, critical aortic stenosis status post TAVR, COPD on 2 to 3 L of oxygen at home, diabetes mellitus type 2 not on insulin, end-stage renal disease on Monday Wednesday Friday dialysis and history of GI bleed secondary to angiodysplasia presented to the hospital on 03/28/2023 with slurred speech.  Patient was noted to have low blood glucose level at 60 initially.  Blood pressure was low at 90 systolic.  Labs showed potassium low at 3.4 with sodium low at 133.  Creatinine elevated at 4.6.  Hemoglobin was 7.5 with platelets low at 110.  TSH elevated at 11.4.  CT head scan was negative for acute findings.  Chest x-ray showed pulmonary vascular congestion.  Patient developed bright red blood per rectum on 03/29/2023 and GI was consulted.  Assessment/Plan: Principal Problem:   Acute metabolic encephalopathy Active Problems:   Hypotension   Fall at home, initial encounter   Non-insulin dependent type 2 diabetes mellitus (HCC)   Essential hypertension   Reactive airway disease   Chronic hypoxic respiratory failure (HCC)   History of transcatheter aortic valve replacement (TAVR)   Anemia of chronic disease   History of CAD status post CABG (coronary artery disease)   ESRD (end stage renal disease) (HCC)   Chronic idiopathic thrombocytopenia (HCC)   Generalized anxiety disorder   Hypoglycemia   Acute toxic metabolic encephalopathy  Likely multifactorial from hypoglycemia hypotension and Ativan at home.  Improved at this time was still hypoglycemic this morning and encouraged oral.   Hypotension on admission Resolved after IVF bolus and IV fluids and midodrine albumin.  Currently has been started on Coreg.  Pressure has been stable.   GI bleed  during hospitalization. Prior admission 01/2023 with findings of 2 angiodysplastic lesions cauterized at that time aspirin on hold.  Continue Protonix.  Status post units of packed RBC transfusion.  GI has seen the patient and thought to be secondary to internal hemorrhoids.  Has resolved at this time.  Latest hemoglobin of 9.0 from 6.1, two days back.  CT scan of the abdomen pelvis was ordered and is pending at this time..  Anemia of acute blood loss Anemia of renal disease Baseline hemoglobin is 7-8--transfused as above.  Latest hemoglobin of 9.0   ESRD on hemodialysis. Nephrology on board for hemodialysis needs.   Chronic COPD on 2 to 3 L at home at baseline Continue supplemental oxygen.     CAD status post CABG 2022 TAVR 12/2022 on aspirin Chronic diastolic heart failure EF 60-65% previously Has been started on Coreg.  Imdur clonidine patch and Benicar on hold   Psoriatic arthritis Gout Seems to be on prednisone 5 milligram.  Allopurinol on hold   Altered TSH and Cortisol--question secondary to chronic prednisone.   Cut back cortef to prednisone 20 until 2/19 and then back to 5 mg dose T4 is normal despite altered TSH 11.4 probably affected by addisonianism--needs referral to endocrine as outpatient and repeat both TSH T4   Generalized anxiety disorder Continue BuSpar 5 3 times daily stop Ativan   Hypokalemia. Has improved at this time.   DVT prophylaxis: SCDs Start: 03/28/23 2200 Place TED hose Start: 03/28/23 2200   Disposition: Home health as per PT.  Likely in 1 to 2 days.  Status is: Inpatient Remains inpatient appropriate because: Pending clinical improvement, pending imaging.    Code Status:     Code Status: Full Code  Family Communication: None at bedside  Consultants: GI Nephrology  Procedures: PRBC transfusion 2 units Hemodialysis  Anti-infectives:  None  Anti-infectives (From admission, onward)    Start     Dose/Rate Route Frequency Ordered  Stop   03/28/23 2325  amoxicillin (AMOXIL) capsule 250 mg  Status:  Discontinued       Note to Pharmacy: Take 4 tablets 1 hour prior to dental work, including cleanings.     250 mg Oral As directed 03/28/23 2325 03/28/23 2330   03/28/23 2322  amoxicillin (AMOXIL) capsule 2,000 mg  Status:  Discontinued       Note to Pharmacy: Take 4 tablets 1 hour prior to dental work, including cleanings.     2,000 mg Oral As directed 03/28/23 2322 03/28/23 2323        Subjective: Today, patient was seen and examined at bedside.  Patient states that her shaking has decreased today and was able to ambulate some in the room.  Complains of mild congestion.  Has mild abdominal discomfort.  Objective: Vitals:   03/31/23 0800 03/31/23 1117  BP: (!) 160/56 (!) 153/64  Pulse: 60 60  Resp: 16 14  Temp: 98.4 F (36.9 C) 98.2 F (36.8 C)  SpO2: 100% 100%    Intake/Output Summary (Last 24 hours) at 03/31/2023 1131 Last data filed at 03/30/2023 1200 Gross per 24 hour  Intake --  Output 3000 ml  Net -3000 ml   Filed Weights   03/30/23 0842 03/30/23 1220 03/31/23 0459  Weight: 61.9 kg 58.9 kg 55.4 kg   Body mass index is 22.34 kg/m.   Physical Exam:  GENERAL: Patient is alert awake and oriented. Not in obvious distress.  On 3 L of oxygen by nasal cannula HENT: No scleral pallor or icterus. Pupils equally reactive to light. Oral mucosa is moist NECK: is supple, no gross swelling noted. CHEST: Coarse breath sounds noted..  Diminished breath sounds bilaterally. CVS: S1 and S2 heard, no murmur. Regular rate and rhythm.  ABDOMEN: Soft tenderness over the left upper quadrant,, bowel sounds are present. EXTREMITIES: No edema.  Left upper extremity AV fistula. CNS: Cranial nerves are intact. No focal motor deficits. SKIN: warm and dry without rashes.  Data Review: I have personally reviewed the following laboratory data and studies,  CBC: Recent Labs  Lab 03/28/23 1745 03/29/23 0519 03/29/23 0905  03/29/23 2037 03/30/23 0509 03/31/23 0617  WBC 7.5 7.4  --   --  8.3 7.6  NEUTROABS 5.4  --   --   --  7.9* 5.4  HGB 7.5* 6.1* 7.3* 9.1* 9.8* 9.0*  HCT 26.8* 21.7* 26.1* 30.8* 32.8* 30.3*  MCV 117.0* 114.2*  --   --  101.5* 100.7*  PLT 110* 90*  --   --  87* 109*   Basic Metabolic Panel: Recent Labs  Lab 03/28/23 1745 03/29/23 0519 03/30/23 0509 03/31/23 0617  NA 133* 132* 130* 131*  K 3.4* 3.2* 4.3 3.9  CL 98 98 96* 94*  CO2 24 26 21* 27  GLUCOSE 89 106* 170* 77  BUN 12 14 19 15   CREATININE 4.69* 5.37* 6.28* 4.81*  CALCIUM 8.8* 8.6* 9.5 8.5*  MG  --   --  1.9  --   PHOS  --  3.7  --   --    Liver Function Tests: Recent Labs  Lab 03/28/23 1745 03/29/23 0519  AST 17 12*  ALT 9 8  ALKPHOS 52 40  BILITOT 0.6 0.7  PROT 5.3* 5.4*  ALBUMIN 2.5* 3.0*   No results for input(s): "LIPASE", "AMYLASE" in the last 168 hours. No results for input(s): "AMMONIA" in the last 168 hours. Cardiac Enzymes: No results for input(s): "CKTOTAL", "CKMB", "CKMBINDEX", "TROPONINI" in the last 168 hours. BNP (last 3 results) No results for input(s): "BNP" in the last 8760 hours.  ProBNP (last 3 results) No results for input(s): "PROBNP" in the last 8760 hours.  CBG: Recent Labs  Lab 03/30/23 1937 03/31/23 0329 03/31/23 0827 03/31/23 0921 03/31/23 1116  GLUCAP 89 113* 69* 134* 129*   No results found for this or any previous visit (from the past 240 hours).   Studies: No results found.    Joycelyn Das, MD  [facility] 03/31/2023  If 7PM-7AM, please contact night-coverage

## 2023-03-31 NOTE — Progress Notes (Signed)
Pt return to unit from CT scan

## 2023-04-01 ENCOUNTER — Inpatient Hospital Stay (HOSPITAL_COMMUNITY): Payer: Medicare PPO

## 2023-04-01 DIAGNOSIS — G9341 Metabolic encephalopathy: Secondary | ICD-10-CM | POA: Diagnosis not present

## 2023-04-01 LAB — CBC WITH DIFFERENTIAL/PLATELET
Abs Immature Granulocytes: 0.02 10*3/uL (ref 0.00–0.07)
Basophils Absolute: 0 10*3/uL (ref 0.0–0.1)
Basophils Relative: 0 %
Eosinophils Absolute: 0.7 10*3/uL — ABNORMAL HIGH (ref 0.0–0.5)
Eosinophils Relative: 12 %
HCT: 29.8 % — ABNORMAL LOW (ref 36.0–46.0)
Hemoglobin: 8.7 g/dL — ABNORMAL LOW (ref 12.0–15.0)
Immature Granulocytes: 0 %
Lymphocytes Relative: 10 %
Lymphs Abs: 0.6 10*3/uL — ABNORMAL LOW (ref 0.7–4.0)
MCH: 29.6 pg (ref 26.0–34.0)
MCHC: 29.2 g/dL — ABNORMAL LOW (ref 30.0–36.0)
MCV: 101.4 fL — ABNORMAL HIGH (ref 80.0–100.0)
Monocytes Absolute: 0.5 10*3/uL (ref 0.1–1.0)
Monocytes Relative: 8 %
Neutro Abs: 4.1 10*3/uL (ref 1.7–7.7)
Neutrophils Relative %: 70 %
Platelets: 96 10*3/uL — ABNORMAL LOW (ref 150–400)
RBC: 2.94 MIL/uL — ABNORMAL LOW (ref 3.87–5.11)
RDW: 21.4 % — ABNORMAL HIGH (ref 11.5–15.5)
WBC: 5.9 10*3/uL (ref 4.0–10.5)
nRBC: 0 % (ref 0.0–0.2)

## 2023-04-01 LAB — GLUCOSE, CAPILLARY: Glucose-Capillary: 131 mg/dL — ABNORMAL HIGH (ref 70–99)

## 2023-04-01 LAB — BASIC METABOLIC PANEL
Anion gap: 8 (ref 5–15)
BUN: 21 mg/dL (ref 8–23)
CO2: 25 mmol/L (ref 22–32)
Calcium: 8.7 mg/dL — ABNORMAL LOW (ref 8.9–10.3)
Chloride: 96 mmol/L — ABNORMAL LOW (ref 98–111)
Creatinine, Ser: 6.22 mg/dL — ABNORMAL HIGH (ref 0.44–1.00)
GFR, Estimated: 7 mL/min — ABNORMAL LOW (ref >60)
Glucose, Bld: 122 mg/dL — ABNORMAL HIGH (ref 70–99)
Potassium: 3.5 mmol/L (ref 3.5–5.1)
Sodium: 129 mmol/L — ABNORMAL LOW (ref 135–145)

## 2023-04-01 MED ORDER — HYDROXYZINE HCL 10 MG PO TABS
10.0000 mg | ORAL_TABLET | Freq: Once | ORAL | Status: DC | PRN
  Filled 2023-04-01: qty 1

## 2023-04-01 NOTE — Progress Notes (Signed)
 [facility] Progress Note   Subjective: Sitting up in bed. No C/Os. HD later today.    Objective Vitals:   03/31/23 2300 04/01/23 0300 04/01/23 0832 04/01/23 0836  BP: (!) 158/51 (!) 168/61 (!) 147/62   Pulse: 60 61 62 65  Resp: 20 14 20  (!) 21  Temp: 98.1 F (36.7 C) 98.1 F (36.7 C) 98.6 F (37 C)   TempSrc: Oral Oral Oral   SpO2: 100% 100% 100% 100%  Weight:      Height:       Physical Exam General: Pleasant female who looks much older that stated age Neuro: responding to questions appropriately. A & O X 2.  Heart: S1,S2 RRR SR on monitor.  Lungs: CTAB no WOB Abdomen:NABS, NT Extremities: No LE edema Dialysis Access: L AVF + T/B  Additional Objective Labs: Basic Metabolic Panel: Recent Labs  Lab 03/29/23 0519 03/30/23 0509 03/31/23 0617 04/01/23 0459  NA 132* 130* 131* 129*  K 3.2* 4.3 3.9 3.5  CL 98 96* 94* 96*  CO2 26 21* 27 25  GLUCOSE 106* 170* 77 122*  BUN 14 19 15 21   CREATININE 5.37* 6.28* 4.81* 6.22*  CALCIUM 8.6* 9.5 8.5* 8.7*  PHOS 3.7  --   --   --    Liver Function Tests: Recent Labs  Lab 03/28/23 1745 03/29/23 0519  AST 17 12*  ALT 9 8  ALKPHOS 52 40  BILITOT 0.6 0.7  PROT 5.3* 5.4*  ALBUMIN 2.5* 3.0*   No results for input(s): "LIPASE", "AMYLASE" in the last 168 hours. CBC: Recent Labs  Lab 03/28/23 1745 03/29/23 0519 03/29/23 0905 03/30/23 0509 03/31/23 0617 04/01/23 0459  WBC 7.5 7.4  --  8.3 7.6 5.9  NEUTROABS 5.4  --   --  7.9* 5.4 4.1  HGB 7.5* 6.1*   < > 9.8* 9.0* 8.7*  HCT 26.8* 21.7*   < > 32.8* 30.3* 29.8*  MCV 117.0* 114.2*  --  101.5* 100.7* 101.4*  PLT 110* 90*  --  87* 109* 96*   < > = values in this interval not displayed.   Blood Culture    Component Value Date/Time   SDES BLOOD RIGHT ARM 02/18/2023 0915   SPECREQUEST  02/18/2023 0915    BOTTLES DRAWN AEROBIC AND ANAEROBIC Blood Culture adequate volume   CULT  02/18/2023 0915    NO GROWTH 5 DAYS Performed at [facility],  [address] [place], [place] [address]    REPTSTATUS 02/23/2023 FINAL 02/18/2023 0915    Cardiac Enzymes: No results for input(s): "CKTOTAL", "CKMB", "CKMBINDEX", "TROPONINI" in the last 168 hours. CBG: Recent Labs  Lab 03/31/23 1116 03/31/23 1550 03/31/23 2000 03/31/23 2345 04/01/23 0328  GLUCAP 129* 161* 169* 127* 131*   Iron Studies: No results for input(s): "IRON", "TIBC", "TRANSFERRIN", "FERRITIN" in the last 72 hours. @lablastinr3 @ Studies/Results: US Abdomen Limited RUQ (LIVER/GB) Result Date: 04/01/2023 CLINICAL DATA:  Right upper quadrant pain EXAM: ULTRASOUND ABDOMEN LIMITED RIGHT UPPER QUADRANT COMPARISON:  CT from the previous day. FINDINGS: Gallbladder: Gallbladder is decompressed with wall thickening. This is in part due to the decompressed state as well as underlying ascites. No cholelithiasis is seen. Common bile duct: Diameter: 1.9 mm. Liver: Increase in echogenicity consistent with fatty infiltration. Portal vein is patent on color Doppler imaging with normal direction of blood flow towards the liver. Other: None. IMPRESSION: Decompressed gallbladder with wall thickening accentuated by ascites. Correlate with the physical exam. HIDA scan may be helpful to  assess for gallbladder function. Fatty liver. Electronically Signed   By: Alcide Clever M.D.   On: 04/01/2023 00:50   CT ABDOMEN PELVIS WO CONTRAST Result Date: 03/31/2023 CLINICAL DATA:  Acute left-sided abdominal pain. EXAM: CT ABDOMEN AND PELVIS WITHOUT CONTRAST TECHNIQUE: Multidetector CT imaging of the abdomen and pelvis was performed following the standard protocol without IV contrast. RADIATION DOSE REDUCTION: This exam was performed according to the departmental dose-optimization program which includes automated exposure control, adjustment of the mA and/or kV according to patient size and/or use of iterative reconstruction technique. COMPARISON:  January 02, 2023. FINDINGS: Lower chest: No acute abnormality.  Hepatobiliary: Nodular hepatic contours are noted suggesting hepatic cirrhosis. Moderate gallbladder wall thickening is noted without cholelithiasis. Pancreas: Unremarkable. No pancreatic ductal dilatation or surrounding inflammatory changes. Spleen: Normal in size without focal abnormality. Adrenals/Urinary Tract: Adrenal glands are unremarkable. Bilateral renal cysts are noted for which no further follow-up is required. No hydronephrosis or renal obstruction is noted. Stomach/Bowel: Stomach is unremarkable. There is no evidence of bowel obstruction or inflammation. The appendix appears normal. Diverticulosis of transverse colon is noted without inflammation. Vascular/Lymphatic: Aortic atherosclerosis. No enlarged abdominal or pelvic lymph nodes. Reproductive: Uterus and bilateral adnexa are unremarkable. Other: Minimal ascites is noted around the liver and spleen. No hernia. Musculoskeletal: Status post surgical posterior fusion of L3-4 with bilateral intrapedicular screw placement. 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 IMPRESSION: Nodular hepatic contours are noted suggesting hepatic cirrhosis. Moderate gallbladder wall thickening is noted without cholelithiasis. Ultrasound is recommended for further evaluation. Minimal ascites. Diverticulosis of transverse colon without inflammation. Aortic Atherosclerosis (ICD10-I70.0). Electronically Signed   By: Lupita Raider M.D.   On: 03/31/2023 16:38   Medications:   sodium chloride   Intravenous Once   allopurinol  100 mg Oral Q M,W,F-HD   arformoterol  15 mcg Nebulization BID   busPIRone  5 mg Oral TID   carvedilol  25 mg Oral BID WC   Chlorhexidine Gluconate Cloth  6 each Topical Q0600   cholecalciferol  2,000 Units Oral Daily   cinacalcet  60 mg Oral Once per day on Monday Friday   And   cinacalcet  90 mg Oral Every Wednesday   [START ON 04/05/2023] cloNIDine  0.3 mg Transdermal Q Sun   isosorbide mononitrate  60 mg  Oral Daily   NIFEdipine  60 mg Oral BID   pantoprazole (PROTONIX) IV  40 mg Intravenous Q12H   predniSONE  20 mg Oral QAC breakfast   sodium chloride flush  3 mL Intravenous Q12H   sodium chloride flush  3 mL Intravenous Q12H     OP HD: MWF [facility] [address] 3.5h  58.5kg  B300  heparin none  L AVF - spoke w/ HD RN, pt has good compliance, doesn't skip or s/o early   Assessment/ Plan: Acute metobolic encephalopathy - possibly polypharmacy. Doubt uremia because her BUN/ creat are very low and has not missed HD. Tells me that she took ativan.Per pmd.  Possible GIB- WU per primary/GI. S/P PRBCS as noted below.  ESRD - on HD MWF. Has not missed HD. HD today on schedule. Next HD 04/01/2023 HTN -BP now climbing, elevated yesterday afternoon. Resumed home meds. Monitor.  Volume - no sig edema, CXR borderline congestion which appears to have resolved with HD. Now slightly under OP EDW. UF as tolerated.   Anemia of esrd - Hb 7.5 on admission then 6.1 03/29/2023.  S/P 2u prbc's. HGB 8.7 this  AM.  Secondary hyperparathyroidism - CCa in range, add on phos. Get binder info.  COPD - on 2-3 L [place] O2 at home GAD-Ativan stopped per primary  Elizabethann Lackey H. Alayna Mabe NP-C 04/01/2023, 9:13 AM  [employer] [phone]

## 2023-04-01 NOTE — TOC Transition Note (Signed)
 Transition of Care (TOC) - Discharge Note Donn Pierini RN,BSN Transitions of Care Unit 4NP (Non Trauma)- RN Case Manager See Treatment Team for direct Phone #   Patient Details  Name: Jodi Bell MRN: [id number] Date of Birth: 05-Jun-1960  Transition of [facility] CM/SW Contact:  Darrold Span, RN Phone Number: 04/01/2023, 11:57 AM   Clinical Narrative:    Pt stable for transition home today, Orders placed for HHPT/OT.   CM in to speak with pt at bedside- list provided for [facility] choice Per CMS guidelines from [email or web address] website with star ratings (copy placed in shadow chart), pt voiced she is agreeable to [facility] and wants to use [facility] to service.   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  Pt reports that one of her children will provide transportation home.   Address, phone # and PCP all confirmed.   Call made to [facility] liaison- referral has been accepted- may be a delay in start of care till the weekend- due to winter weather.    Final next level of care: Home w Home Health Services Barriers to Discharge: Barriers Resolved   Patient Goals and CMS Choice Patient states their goals for this hospitalization and ongoing recovery are:: return home CMS Medicare.gov Compare Post Acute Care list provided to:: Patient Choice offered to / list presented to : Patient      Discharge Placement               Home w/ [facility]        Discharge Plan and Services Additional resources added to the After Visit Summary for     Discharge Planning Services: CM Consult Post Acute Care Choice: Home Health          DME Arranged: N/A DME Agency: NA       HH Arranged: OT, PT HH Agency: [facility] Date [facility] Agency Contacted: 04/01/23 Time HH Agency Contacted: 1156 Representative spoke with at [facility] Agency: Clifton Custard  Social Drivers of Health (SDOH) Interventions SDOH Screenings   Food  Insecurity: No Food Insecurity (02/26/2023)   Received from [facility]  Housing: Low Risk  (02/26/2023)   Received from [facility]  Transportation Needs: Unmet Transportation Needs (02/26/2023)   Received from [facility]  Utilities: Not At Risk (02/26/2023)   Received from [facility]  Financial Resource Strain: Low Risk  (02/26/2023)   Received from [facility]  Physical Activity: Unknown (02/26/2023)   Received from [facility]  Social Connections: Socially Integrated (02/26/2023)   Received from [facility]  Stress: No Stress Concern Present (02/26/2023)   Received from [facility]  Tobacco Use: Medium Risk (03/29/2023)     Readmission Risk Interventions    04/01/2023   11:57 AM 01/05/2023    5:05 PM 07/23/2021    3:40 PM  Readmission Risk Prevention Plan  Transportation Screening Complete Complete Complete  PCP or Specialist Appt within 3-5 Days Complete    HRI or Home Care Consult Complete    Social Work Consult for Recovery Care Planning/Counseling Patient refused    Palliative Care Screening Not Applicable    Medication Review Oceanographer) Complete Referral to Pharmacy Complete  PCP or Specialist appointment within 3-5 days of discharge  Complete Complete  HRI or Home Care Consult  Complete Complete  SW Recovery Care/Counseling Consult  Complete Complete  Palliative Care Screening  Not Applicable Not Applicable  Skilled Nursing Facility  Not  Applicable Not Applicable

## 2023-04-01 NOTE — Progress Notes (Signed)
 Physical Therapy Treatment Patient Details Name: Jodi Bell MRN: [id number] DOB: [birth year] Today's Date: 04/01/2023   History of Present Illness 63 yo female presents 2/15 with weakness workup for GIB and anemia. PMH GIB DM II, CAD s/p CABG in 2022, anxiety, arthritis, COPD, ESRD on HD, GERD, gout, obesity, PAD, and HTN, TAVR    PT Comments  Pt admitted with above diagnosis. Pt was able to progress ambulation and didn't need much help from PT today. Pt progressed ambulation distance. Agree with HHPT.  Pt states she plans to go home today.  Pt currently with functional limitations due to the deficits listed below (see PT Problem List). Pt will benefit from acute skilled PT to increase their independence and safety with mobility to allow discharge.       If plan is discharge home, recommend the following: A little help with walking and/or transfers;A little help with bathing/dressing/bathroom;Assistance with cooking/housework;Help with stairs or ramp for entrance;Assist for transportation   Can travel by private vehicle        Equipment Recommendations  None recommended by PT    Recommendations for Other Services       Precautions / Restrictions Precautions Precautions: Fall Recall of Precautions/Restrictions: Intact Precaution/Restrictions Comments: pt recently fell at home Restrictions Weight Bearing Restrictions Per Provider Order: No     Mobility  Bed Mobility   Bed Mobility: Sit to Supine       Sit to supine: Supervision   General bed mobility comments: Pt in chair on arrival. Returned pt to bed at end of session as HD coming to get pt.    Transfers Overall transfer level: Needs assistance Equipment used: Rolling walker (2 wheels) Transfers: Sit to/from Stand, Bed to chair/wheelchair/BSC Sit to Stand: Contact guard assist           General transfer comment: stable with use of RW    Ambulation/Gait Ambulation/Gait assistance: Contact guard assist Gait  Distance (Feet): 80 Feet Assistive device: Rolling walker (2 wheels) Gait Pattern/deviations: Step-through pattern, Trunk flexed, Shuffle Gait velocity: decreased Gait velocity interpretation: <1.8 ft/sec, indicate of risk for recurrent falls   General Gait Details: Pt with steady gait with RW today. Fatigues  with ambulation but able to incr distance today. SPO2 remained 99% on 3L O2   Stairs             Wheelchair Mobility     Tilt Bed    Modified Rankin (Stroke Patients Only)       Balance Overall balance assessment: History of Falls, Needs assistance Sitting-balance support: Feet supported, No upper extremity supported Sitting balance-Leahy Scale: Good     Standing balance support: No upper extremity supported, During functional activity Standing balance-Leahy Scale: Poor Standing balance comment: reliant on RW dynamically.                            Communication Communication Communication: No apparent difficulties  Cognition Arousal: Alert Behavior During Therapy: WFL for tasks assessed/performed                             Following commands: Intact      Cueing Cueing Techniques: Verbal cues  Exercises      General Comments        Pertinent Vitals/Pain Pain Assessment Pain Assessment: No/denies pain    Home Living Family/patient expects to be discharged to:: Private residence Living Arrangements: Children Available  Help at Discharge: Family;Available PRN/intermittently Type of Home: House Home Access: Level entry     Alternate Level Stairs-Number of Steps: 13 Home Layout: Multi-level;Able to live on main level with bedroom/bathroom Home Equipment: Cane - single point;Rolling Walker (2 wheels);Rollator (4 wheels);Shower seat;Toilet riser;Grab bars - tub/shower;Hand held shower head Additional Comments: occasionally goes upstairs to kitchen, but mainly stays on main floor.Has been weaning herself down from 3L O2 to 2L  O2. Daughter lives with her but works and sometimes travels for work. If she is gone one of her other children usually comes to stay    Prior Function            PT Goals (current goals can now be found in the care plan section) Progress towards PT goals: Progressing toward goals    Frequency    Min 1X/week      PT Plan      Co-evaluation              AM-PAC PT "6 Clicks" Mobility   Outcome Measure  Help needed turning from your back to your side while in a flat bed without using bedrails?: A Little Help needed moving from lying on your back to sitting on the side of a flat bed without using bedrails?: A Little Help needed moving to and from a bed to a chair (including a wheelchair)?: A Little Help needed standing up from a chair using your arms (e.g., wheelchair or bedside chair)?: A Little Help needed to walk in hospital room?: A Little Help needed climbing 3-5 steps with a railing? : A Lot 6 Click Score: 17    End of Session Equipment Utilized During Treatment: Gait belt;Oxygen Activity Tolerance: Patient tolerated treatment well Patient left: with call bell/phone within reach;in bed;with bed alarm set Nurse Communication: Mobility status PT Visit Diagnosis: Unsteadiness on feet (R26.81);History of falling (Z91.81);Muscle weakness (generalized) (M62.81);Difficulty in walking, not elsewhere classified (R26.2);Pain     Time: 1151-1209 PT Time Calculation (min) (ACUTE ONLY): 18 min  Charges:    $Gait Training: 8-22 mins PT General Charges $$ ACUTE PT VISIT: 1 Visit                     Miking Usrey M,PT Acute Rehab Services [phone]    Bevelyn Buckles 04/01/2023, 1:54 PM

## 2023-04-01 NOTE — Discharge Summary (Signed)
 Physician Discharge Summary  ERLINE SIDDOWAY ZOX:[id number] DOB: February 26, 1960 DOA: 03/28/2023  PCP: Genelle Gather, MD  Admit date: 03/28/2023 Discharge date: 04/02/2023  Admitted From: Home  Discharge disposition: Home with home health   Recommendations for Outpatient Follow-Up:   Follow up with your primary care provider in one week.  Check CBC, BMP, magnesium in the next visit If patient gets confused or disoriented can add  lactulose, rifaximin as outpatient. Patient has been advised to decrease Ativan at home if possible. Consider checking thyroid function test in 3 to 4 weeks   Discharge Diagnosis:   Principal Problem:   Acute metabolic encephalopathy Active Problems:   Hypotension   Fall at home, initial encounter   Non-insulin dependent type 2 diabetes mellitus (HCC)   Essential hypertension   Reactive airway disease   Chronic hypoxic respiratory failure (HCC)   History of transcatheter aortic valve replacement (TAVR)   Anemia of chronic disease   History of CAD status post CABG (coronary artery disease)   ESRD (end stage renal disease) (HCC)   Chronic idiopathic thrombocytopenia (HCC)   Generalized anxiety disorder   Hypoglycemia   Discharge Condition: Improved.  Diet recommendation:.  Carbohydrate-modified.    Wound care: None.  Code status: Full.   History of Present Illness:   Patient is a 63 years old female with past medical history of chronic heart failure with preserved ejection fraction, critical aortic stenosis status post TAVR, COPD on 2 to 3 L of oxygen at home, diabetes mellitus type 2 not on insulin, end-stage renal disease on Monday Wednesday Friday dialysis and history of GI bleed secondary to angiodysplasia presented to the hospital on 03/28/2023 with slurred speech. Patient was noted to have low blood glucose level at 60 initially. Blood pressure was low at 90 systolic. Labs showed potassium low at 3.4 with sodium low at 133. Creatinine  elevated at 4.6. Hemoglobin was 7.5 with platelets low at 110. TSH elevated at 11.4. CT head scan was negative for acute findings. Chest x-ray showed pulmonary vascular congestion. Patient developed bright red blood per rectum on 03/29/2023 and GI was consulted.    Hospital Course:   Following conditions were addressed during hospitalization as listed below,  Acute toxic metabolic encephalopathy  Likely multifactorial from hypoglycemia hypotension and Ativan at home.  Improved at this time.  Discouraged frequency of use of Ativan if possible.  She has acknowledged this.   Hypotension on admission Resolved after IVF bolus and IV fluids and midodrine albumin.  Blood pressure on the higher side at this time.  Home medications will be resumed on discharge.   GI bleed during hospitalization. Prior admission 01/2023 with findings of 2 angiodysplastic lesions cauterized at that time aspirin on hold.  Continue Protonix.  Status post units of packed RBC transfusion.  GI has seen the patient and thought to be secondary to internal hemorrhoids.  GI bleed has resolved at this time.  Latest hemoglobin of 8.7 from 6.1, two days back.  CT scan of the abdomen with findings of cirrhosis of liver with gallstone but no acute findings.    Anemia of acute blood loss Anemia of renal disease Baseline hemoglobin is 7-8--transfused as above.  Latest hemoglobin of 8.7.   ESRD on hemodialysis. Nephrology saw the patient during hospitalization for hemodialysis needs.     Chronic COPD on 2 to 3 L at home at baseline Continue supplemental oxygen.     CAD status post CABG 2022 TAVR 12/2022 on aspirin Chronic  diastolic heart failure EF 60-65% previously Continue Coreg Imdur clonidine and Benicar.   Psoriatic arthritis Gout Seems to be on prednisone 5 milligram.  Continue on discharge.   Altered TSH and Cortisol--question secondary to chronic prednisone.   Continue prednisone  home dose. T4 is normal despite  altered TSH 11.4 probably affected by addisonianism-recommend outpatient thyroid function test.  Generalized anxiety disorder Continue BuSpar 5 milligram 3 times daily.  Discouraged use of Ativan if possible.   Hypokalemia. Has improved at this time.  Latest potassium of 3.5.   Disposition.  At this time, patient is stable for disposition home with outpatient PCP and nephrology follow-up.  Medical Consultants:   Nephrology GI  Procedures:    Hemodialysis Transfusion of packed RBC 2 units  Subjective:   Today, patient was seen and examined at bedside.  Feels at her baseline.  Denies any dizziness, lightheadedness shortness of breath nausea vomiting.   Discharge Exam:   Vitals:   04/01/23 1935 04/01/23 1947  BP:  (!) 196/60  Pulse:  64  Resp:    Temp: (P) 98.5 F (36.9 C)   SpO2:     Vitals:   04/01/23 1915 04/01/23 1920 04/01/23 1935 04/01/23 1947  BP: (!) 205/62 (!) 196/60  (!) 196/60  Pulse: 64   64  Resp: 18     Temp: 98.5 F (36.9 C)  (P) 98.5 F (36.9 C)   TempSrc: Oral     SpO2: 100%     Weight:      Height:        General: Alert awake, not in obvious distress, on nasal cannula oxygen, appears older than stated age HENT: pupils equally reacting to light,  No scleral pallor or icterus noted. Oral mucosa is moist.  Chest:    Diminished breath sounds bilaterally CVS: S1 &S2 heard. No murmur.  Regular rate and rhythm. Abdomen: Soft, nontender, nondistended.  Bowel sounds are heard.   Extremities: No cyanosis, clubbing or edema.  Peripheral pulses are palpable. Psych: Alert, awake and oriented, normal mood CNS:  No cranial nerve deficits.  Power equal in all extremities.  Left upper extremity AV fistula. Skin: Warm and dry.  No rashes noted.  The results of significant diagnostics from this hospitalization (including imaging, microbiology, ancillary and laboratory) are listed below for reference.     Diagnostic Studies:   DG Chest Port 1 View Result  Date: 03/28/2023 CLINICAL DATA:  weakness EXAM: PORTABLE CHEST 1 VIEW COMPARISON:  Chest x-ray 01/02/2023, CT cardiac 03/10/2023 FINDINGS: Persistent cardiomegaly. The heart and mediastinal contours are unchanged. Atherosclerotic plaque. Aortic valve replacement. Coronary artery calcification. Mitral annular calcification. Prominent hilar vasculature. No focal consolidation. No over pulmonary edema. No pleural effusion. No pneumothorax. No acute osseous abnormality.  Intact sternotomy wires noted. IMPRESSION: 1. Cardiomegaly with pulmonary venous congestion. 2. Aortic Atherosclerosis (ICD10-I70.0) including coronary artery and mitral annular calcification. Electronically Signed   By: Tish Frederickson M.D.   On: 03/28/2023 17:17   CT Head Wo Contrast Result Date: 03/28/2023 CLINICAL DATA:  Facial paralysis/weakness (CN 7) possible cva not acute EXAM: CT HEAD WITHOUT CONTRAST TECHNIQUE: Contiguous axial images were obtained from the base of the skull through the vertex without intravenous contrast. RADIATION DOSE REDUCTION: This exam was performed according to the departmental dose-optimization program which includes automated exposure control, adjustment of the mA and/or kV according to patient size and/or use of iterative reconstruction technique. COMPARISON:  CT head 01/02/2023, MRI head 05/02/2021 FINDINGS: Brain: Patchy and confluent areas of  decreased attenuation are noted throughout the deep and periventricular white matter of the cerebral hemispheres bilaterally, compatible with chronic microvascular ischemic disease. No evidence of large-territorial acute infarction. No parenchymal hemorrhage. No mass lesion. No extra-axial collection. No mass effect or midline shift. No hydrocephalus. Basilar cisterns are patent. Vascular: No hyperdense vessel. Atherosclerotic calcifications are present within the cavernous internal carotid arteries. Skull: No acute fracture or focal lesion. Sinuses/Orbits: Paranasal  sinuses and mastoid air cells are clear. Chronic mild proptosis bilaterally. Bilateral lens replacement. Otherwise the orbits are unremarkable. Other: None. IMPRESSION: 1. No acute intracranial abnormality. 2. Chronic mild proptosis bilaterally. Consider ophthalmology consultation. Electronically Signed   By: Tish Frederickson M.D.   On: 03/28/2023 17:05     Labs:   Basic Metabolic Panel: Recent Labs  Lab 03/28/23 1745 03/29/23 0519 03/30/23 0509 03/31/23 0617 04/01/23 0459  NA 133* 132* 130* 131* 129*  K 3.4* 3.2* 4.3 3.9 3.5  CL 98 98 96* 94* 96*  CO2 24 26 21* 27 25  GLUCOSE 89 106* 170* 77 122*  BUN 12 14 19 15 21   CREATININE 4.69* 5.37* 6.28* 4.81* 6.22*  CALCIUM 8.8* 8.6* 9.5 8.5* 8.7*  MG  --   --  1.9  --   --   PHOS  --  3.7  --   --   --    GFR Estimated Creatinine Clearance: 7.4 mL/min (A) (by C-G formula based on SCr of 6.22 mg/dL (H)). Liver Function Tests: Recent Labs  Lab 03/28/23 1745 03/29/23 0519  AST 17 12*  ALT 9 8  ALKPHOS 52 40  BILITOT 0.6 0.7  PROT 5.3* 5.4*  ALBUMIN 2.5* 3.0*   No results for input(s): "LIPASE", "AMYLASE" in the last 168 hours. No results for input(s): "AMMONIA" in the last 168 hours. Coagulation profile Recent Labs  Lab 03/28/23 1745  INR 1.3*    CBC: Recent Labs  Lab 03/28/23 1745 03/29/23 0519 03/29/23 0905 03/29/23 2037 03/30/23 0509 03/31/23 0617 04/01/23 0459  WBC 7.5 7.4  --   --  8.3 7.6 5.9  NEUTROABS 5.4  --   --   --  7.9* 5.4 4.1  HGB 7.5* 6.1* 7.3* 9.1* 9.8* 9.0* 8.7*  HCT 26.8* 21.7* 26.1* 30.8* 32.8* 30.3* 29.8*  MCV 117.0* 114.2*  --   --  101.5* 100.7* 101.4*  PLT 110* 90*  --   --  87* 109* 96*   Cardiac Enzymes: No results for input(s): "CKTOTAL", "CKMB", "CKMBINDEX", "TROPONINI" in the last 168 hours. BNP: Invalid input(s): "POCBNP" CBG: Recent Labs  Lab 03/31/23 1116 03/31/23 1550 03/31/23 2000 03/31/23 2345 04/01/23 0328  GLUCAP 129* 161* 169* 127* 131*   D-Dimer No results  for input(s): "DDIMER" in the last 72 hours. Hgb A1c No results for input(s): "HGBA1C" in the last 72 hours. Lipid Profile No results for input(s): "CHOL", "HDL", "LDLCALC", "TRIG", "CHOLHDL", "LDLDIRECT" in the last 72 hours. Thyroid function studies No results for input(s): "TSH", "T4TOTAL", "T3FREE", "THYROIDAB" in the last 72 hours.  Invalid input(s): "FREET3" Anemia work up No results for input(s): "VITAMINB12", "FOLATE", "FERRITIN", "TIBC", "IRON", "RETICCTPCT" in the last 72 hours. Microbiology No results found for this or any previous visit (from the past 240 hours).   Discharge Instructions:   Discharge Instructions     Call MD for:  temperature >100.4   Complete by: As directed    Diet - low sodium heart healthy   Complete by: As directed    Diet Carb Modified  Complete by: As directed    Discharge instructions   Complete by: As directed    Follow up with your primary care provider in one week. Continue hemodialysis as scheduled. Seek medical attention for worsening symtpoms. Try to minimize ativan if possible. No overexertion   Increase activity slowly   Complete by: As directed       Allergies as of 04/01/2023       Reactions   3-methyl-2-benzothiazolinone Hydrazone Other (See Comments)   Muscle weakness muscle cramping in legs Other reaction(s): Myalgias (Muscle Pain)   Banana Anaphylaxis, Swelling, Other (See Comments)   TONGUE AND MOUTH SWELLING   Black Buyer, retail (non-screening) Anaphylaxis, Itching   Walnuts   Hazelnut (filbert) Anaphylaxis   Hazelnuts   Leflunomide And Related Other (See Comments)   Severe Headache   Lisinopril Swelling   Angioedema  Swelling of the face   No Healthtouch Food Allergies Anaphylaxis   Spicy Mustard 4.7.2021 Pt reports that she eats Regular Yellow Mustard Swelling and itching of tongue   Other Anaphylaxis, Swelling, Other (See Comments)   Cosentyx= angioedema Mouth itches and tongue swelling Hazel  nuts and pecans also Walnuts Renal failure Serotonin syndrome  Tremors Muscle weakness muscle cramping in legs Other reaction(s): Myalgias (Muscle Pain)   Pecan Nut (diagnostic) Anaphylaxis, Itching   Pecans   Trazodone And Nefazodone Other (See Comments)   Serotonin Syndrome  Tremors   Adalimumab Other (See Comments)   Blisters on abdomen =humira   Escitalopram Oxalate Other (See Comments)   Hand problems - Serotonin Syndrome    Ezetimibe Other (See Comments)   myalgias cramps   Secukinumab Swelling   Cosentyx = angiodema   Statins Other (See Comments)   Leg pains and weakness   Ferrlecit [na Ferric Gluc Cplx In Sucrose]    Not reaction given from HD   Gabapentin Other (See Comments)   tremors   Nsaids    Avoid due to renal problems         Medication List     STOP taking these medications    amoxicillin 500 MG capsule Commonly known as: AMOXIL       TAKE these medications    acetaminophen 500 MG tablet Commonly known as: TYLENOL Take 1,000 mg by mouth daily as needed (pain).   albuterol (2.5 MG/3ML) 0.083% nebulizer solution Commonly known as: PROVENTIL Take 3 mLs (2.5 mg total) by nebulization every 6 (six) hours as needed for wheezing or shortness of breath.   Ventolin HFA 108 (90 Base) MCG/ACT inhaler Generic drug: albuterol Inhale 2 puffs into the lungs See admin instructions. Take 2 puffs every 4-6 hours as needed for wheezing and shortness of breath   allopurinol 100 MG tablet Commonly known as: ZYLOPRIM Take 100 mg by mouth every evening.   aspirin 81 MG chewable tablet Chew 1 tablet (81 mg total) by mouth every other day.   busPIRone 5 MG tablet Commonly known as: BUSPAR Take 5 mg by mouth 3 (three) times daily.   carvedilol 25 MG tablet Commonly known as: COREG Take 1 tablet (25 mg total) by mouth 2 (two) times daily with a meal.   cetirizine 10 MG tablet Commonly known as: ZYRTEC Take 10 mg by mouth daily as needed for  allergies.   cinacalcet 30 MG tablet Commonly known as: SENSIPAR Take 60 mg by mouth every Monday, Wednesday, and Friday with hemodialysis. Pt states she takes 2 tab on Mon, 3 tab on Wed, and 2 tab on  Fri.   cloNIDine 0.3 mg/24hr patch Commonly known as: CATAPRES - Dosed in mg/24 hr Place 0.3 mg onto the skin every Sunday.   diclofenac Sodium 1 % Gel Commonly known as: VOLTAREN Apply 2 g topically daily as needed (pain).   docusate sodium 100 MG capsule Commonly known as: COLACE Take 100 mg by mouth daily as needed for moderate constipation.   EPINEPHrine 0.3 mg/0.3 mL Soaj injection Commonly known as: EPI-PEN Inject into the muscle as needed for anaphylaxis.   fluticasone 50 MCG/ACT nasal spray Commonly known as: FLONASE Place 1 spray into both nostrils daily as needed for allergies or rhinitis.   hydrocortisone 2.5%-nystatin-zinc oxide 20% 1:1:1 ointment mixture Apply topically daily as needed.   isosorbide mononitrate 60 MG 24 hr tablet Commonly known as: IMDUR Take 1 tablet (60 mg total) by mouth daily.   LORazepam 0.5 MG tablet Commonly known as: ATIVAN Take 0.5 mg by mouth 3 (three) times daily.   montelukast 10 MG tablet Commonly known as: SINGULAIR Take 10 mg by mouth daily with supper.   multivitamin Tabs tablet Take 1 tablet by mouth daily.   NIFEdipine 60 MG 24 hr tablet Commonly known as: PROCARDIA XL/NIFEDICAL XL Take 60 mg by mouth 2 (two) times daily.   olmesartan 40 MG tablet Commonly known as: BENICAR Take 40 mg by mouth every evening.   ondansetron 4 MG tablet Commonly known as: ZOFRAN Take 4 mg by mouth 2 (two) times daily as needed for nausea or vomiting.   OXYGEN Inhale 2-3 L into the lungs continuous.   pantoprazole 40 MG tablet Commonly known as: PROTONIX Take 40 mg by mouth 2 (two) times daily.   predniSONE 5 MG tablet Commonly known as: DELTASONE Take 1 tablet (5 mg total) by mouth daily with breakfast.   PROBIOTIC PO Take 1  capsule by mouth daily.   silver sulfADIAZINE 1 % cream Commonly known as: SILVADENE Apply 1 Application topically daily as needed (to prevent wound).   Stiolto Respimat 2.5-2.5 MCG/ACT Aers Generic drug: Tiotropium Bromide-Olodaterol Inhale 2 puffs once daily into the lungs.   TEARS NATURALE OP Place 1 drop into both eyes 4 (four) times daily as needed (dry eye).   Vitamin D 50 MCG (2000 UT) tablet Take 2,000 Units by mouth daily.   zolpidem 5 MG tablet Commonly known as: AMBIEN Take 5 mg by mouth at bedtime as needed for sleep.        Follow-up Information     Genelle Gather, MD Follow up in 1 week(s).   Specialty: Internal Medicine Contact information: [address] [place] [address] [phone]         [facility] Follow up.   Why: Karle Starch to service- HHPT/OT arranged- they will contact you to schedule- may be weekend for start of care pending weather                 Time coordinating discharge: 39 minutes  Signed:  Caprice Mccaffrey  [facility] 04/02/2023, 7:36 AM

## 2023-04-01 NOTE — Progress Notes (Signed)
 Pt refusing to have CBG checked. Pt stating "I know my blood sugar isn't low, I feel fine". RN educated pt on reason for CBG checks and monitoring. Pt still refusing. Dr. Tyson Babinski notified and aware.

## 2023-04-01 NOTE — Progress Notes (Signed)
Pt off unit for HD

## 2023-04-01 NOTE — Progress Notes (Signed)
   04/01/23 1836  Vitals  Pulse Rate 65  Resp 18  BP (!) 184/53  SpO2 100 %  O2 Device Nasal Cannula  Oxygen Therapy  O2 Flow Rate (L/min) 3 L/min  Pulse Oximetry Type Continuous  Post Treatment  Dialyzer Clearance Lightly streaked  Liters Processed 84  Fluid Removed (mL) 2500 mL  AVG/AVF Arterial Site Held (minutes) 7 minutes  AVG/AVF Venous Site Held (minutes) 8 minutes   Received patient in bed to unit.  Alert and oriented.  Informed consent signed and in chart.   TX duration:3.5 hours  Patient tolerated well.  Transported back to the room  Alert, without acute distress.  Hand-off given to patient's nurse.  Patients BP elevated, patient denies any discomfort at this time. Medication give, see MAR. Primary Nurse made aware.   Access used: RAVF Access issues: None  Total UF removed: Medication(s) given: See MAR    Laqueta Due, RN [facility]

## 2023-04-01 NOTE — Evaluation (Signed)
 Occupational Therapy Evaluation Patient Details Name: Jodi Bell MRN: [id number] DOB: [birth year] Today's Date: 04/01/2023   History of Present Illness   63 yo female presents 2/15 with weakness workup for GIB and anemia. PMH GIB DM II, CAD s/p CABG in 2022, anxiety, arthritis, COPD, ESRD on HD, GERD, gout, obesity, PAD, and HTN, TAVR     Clinical Impressions PTA, pt was mod I for ADL secondary to decreased activity tolerance and need for rest breaks/increased time. Pt reports it has become more difficult to bathe at home. Daughter assists with meals and laundry with pt able to perform own medication management with use of pill box. Pt with decreased activity tolerance, balance, memory, and strength on eval. Needing CGA for all OOB ADL and mobility with use of RW. Pt to benefit from [facility] to optimize safety and independence at home.      If plan is discharge home, recommend the following:   A little help with walking and/or transfers;A little help with bathing/dressing/bathroom;Assistance with cooking/housework;Assist for transportation;Help with stairs or ramp for entrance     Functional Status Assessment   Patient has had a recent decline in their functional status and demonstrates the ability to make significant improvements in function in a reasonable and predictable amount of time.     Equipment Recommendations   None recommended by OT     Recommendations for Other Services         Precautions/Restrictions   Precautions Precautions: Fall Recall of Precautions/Restrictions: Intact Precaution/Restrictions Comments: pt recently fell at home Restrictions Weight Bearing Restrictions Per Provider Order: No     Mobility Bed Mobility Overal bed mobility: Needs Assistance Bed Mobility: Supine to Sit     Supine to sit: Supervision          Transfers Overall transfer level: Needs assistance Equipment used: Rolling walker (2 wheels) Transfers: Sit to/from  Stand, Bed to chair/wheelchair/BSC Sit to Stand: Contact guard assist     Step pivot transfers: Contact guard assist     General transfer comment: stable with use of RW      Balance Overall balance assessment: History of Falls, Needs assistance Sitting-balance support: Feet supported, No upper extremity supported Sitting balance-Leahy Scale: Good     Standing balance support: No upper extremity supported, During functional activity Standing balance-Leahy Scale: Poor Standing balance comment: reliant on RW dunamically. Was able to stand and lean forward to perform pericare after BM without UE support today.                           ADL either performed or assessed with clinical judgement   ADL Overall ADL's : Needs assistance/impaired     Grooming: Wash/dry hands;Sitting               Lower Body Dressing: Set up;Sit to/from stand Lower Body Dressing Details (indicate cue type and reason): for socks Toilet Transfer: Contact guard assist;Ambulation;Rolling walker (2 wheels)   Toileting- Clothing Manipulation and Hygiene: Contact guard assist;Sit to/from stand       Functional mobility during ADLs: Contact guard assist;Rolling walker (2 wheels)       Vision Patient Visual Report: No change from baseline Additional Comments: able to locatie items in room     Perception         Praxis         Pertinent Vitals/Pain Pain Assessment Pain Assessment: Faces Faces Pain Scale: No hurt Pain Intervention(s): Monitored during session  Extremity/Trunk Assessment Upper Extremity Assessment Upper Extremity Assessment: Generalized weakness   Lower Extremity Assessment Lower Extremity Assessment: Defer to PT evaluation   Cervical / Trunk Assessment Cervical / Trunk Assessment: Kyphotic   Communication Communication Communication: No apparent difficulties   Cognition Arousal: Alert Behavior During Therapy: WFL for tasks  assessed/performed Cognition: No family/caregiver present to determine baseline             OT - Cognition Comments: pt oriented and following commands but suspect some STM deficits. pt with fair safety awareness.                 Following commands: Intact       Cueing  General Comments   Cueing Techniques: Verbal cues      Exercises     Shoulder Instructions      Home Living Family/patient expects to be discharged to:: Private residence Living Arrangements: Children Available Help at Discharge: Family;Available PRN/intermittently Type of Home: House Home Access: Level entry     Home Layout: Multi-level;Able to live on main level with bedroom/bathroom Alternate Level Stairs-Number of Steps: 13 Alternate Level Stairs-Rails: Left Bathroom Shower/Tub: Tub/shower unit   Bathroom Toilet: Standard Bathroom Accessibility: Yes   Home Equipment: Cane - single point;Rolling Walker (2 wheels);Rollator (4 wheels);Shower seat;Toilet riser;Grab bars - tub/shower;Hand held shower head   Additional Comments: occasionally goes upstairs to kitchen, but mainly stays on main floor.Has been weaning herself down from 3L O2 to 2L O2. Daughter lives with her but works and sometimes travels for work. If she is gone one of her other children usually comes to stay      Prior Functioning/Environment Prior Level of Function : Independent/Modified Independent             Mobility Comments: family takes pt to HD, uses cane in home and rollator out of home ADLs Comments: son in law does pt's laundry, and tries to assist her with meals. sits for showers, reports ind with ADLs but has difficulty finding the energy for it. Does not drive    OT Problem List: Decreased strength;Decreased activity tolerance;Impaired balance (sitting and/or standing);Cardiopulmonary status limiting activity;Decreased safety awareness   OT Treatment/Interventions: Self-care/ADL training;Therapeutic  exercise;DME and/or AE instruction;Balance training;Patient/family education;Therapeutic activities;Energy conservation      OT Goals(Current goals can be found in the care plan section)   Acute Rehab OT Goals Patient Stated Goal: go home OT Goal Formulation: With patient Time For Goal Achievement: 04/15/23 Potential to Achieve Goals: Good   OT Frequency:  Min 1X/week    Co-evaluation              AM-PAC OT "6 Clicks" Daily Activity     Outcome Measure Help from another person eating meals?: None Help from another person taking care of personal grooming?: A Little Help from another person toileting, which includes using toliet, bedpan, or urinal?: A Little Help from another person bathing (including washing, rinsing, drying)?: A Little Help from another person to put on and taking off regular upper body clothing?: A Little Help from another person to put on and taking off regular lower body clothing?: A Little 6 Click Score: 19   End of Session Equipment Utilized During Treatment: Gait belt;Rolling walker (2 wheels) Nurse Communication: Mobility status  Activity Tolerance: Patient tolerated treatment well Patient left: with call bell/phone within reach;in chair;with nursing/sitter in room  OT Visit Diagnosis: Unsteadiness on feet (R26.81);History of falling (Z91.81);Muscle weakness (generalized) (M62.81)  Time: 3664-4034 OT Time Calculation (min): 24 min Charges:  OT General Charges $OT Visit: 1 Visit OT Evaluation $OT Eval Low Complexity: 1 Low OT Treatments $Self Care/Home Management : 8-22 mins  Tyler Deis, OTR/L [facility] Acute Rehabilitation Office: [phone]   Myrla Halsted 04/01/2023, 10:26 AM

## 2023-04-01 NOTE — Progress Notes (Signed)
 Subjective: Reports resolution of abdominal pain. Has not had any further rectal bleeding.  Objective: Vital signs in last 24 hours: Temp:  [97.9 F (36.6 C)-98.6 F (37 C)] 98.6 F (37 C) (02/19 0832) Pulse Rate:  [52-74] 65 (02/19 0836) Resp:  [13-21] 21 (02/19 0836) BP: (147-187)/(51-69) 147/62 (02/19 0832) SpO2:  [100 %] 100 % (02/19 0836) Weight change:  Last BM Date : 03/31/23  PE: Ill-appearing GENERAL: Prominent pallor  ABDOMEN: Mild abdominal tenderness over left upper quadrant without rebound tenderness/rigidity or guarding EXTREMITIES: No deformity  Lab Results: Results for orders placed or performed during the hospital encounter of 03/28/23 (from the past 48 hours)  Glucose, capillary     Status: None   Collection Time: 03/30/23  3:50 PM  Result Value Ref Range   Glucose-Capillary 85 70 - 99 mg/dL    Comment: Glucose reference range applies only to samples taken after fasting for at least 8 hours.  Glucose, capillary     Status: None   Collection Time: 03/30/23  7:37 PM  Result Value Ref Range   Glucose-Capillary 89 70 - 99 mg/dL    Comment: Glucose reference range applies only to samples taken after fasting for at least 8 hours.  Glucose, capillary     Status: Abnormal   Collection Time: 03/31/23  3:29 AM  Result Value Ref Range   Glucose-Capillary 113 (H) 70 - 99 mg/dL    Comment: Glucose reference range applies only to samples taken after fasting for at least 8 hours.  Basic metabolic panel     Status: Abnormal   Collection Time: 03/31/23  6:17 AM  Result Value Ref Range   Sodium 131 (L) 135 - 145 mmol/L   Potassium 3.9 3.5 - 5.1 mmol/L   Chloride 94 (L) 98 - 111 mmol/L   CO2 27 22 - 32 mmol/L   Glucose, Bld 77 70 - 99 mg/dL    Comment: Glucose reference range applies only to samples taken after fasting for at least 8 hours.   BUN 15 8 - 23 mg/dL   Creatinine, Ser 9.14 (H) 0.44 - 1.00 mg/dL   Calcium 8.5 (L) 8.9 - 10.3 mg/dL   GFR, Estimated 10 (L)  >60 mL/min    Comment: (NOTE) Calculated using the CKD-EPI Creatinine Equation (2021)    Anion gap 10 5 - 15    Comment: Performed at [facility], [address] [place], [place] [address]  CBC with Differential/Platelet     Status: Abnormal   Collection Time: 03/31/23  6:17 AM  Result Value Ref Range   WBC 7.6 4.0 - 10.5 K/uL   RBC 3.01 (L) 3.87 - 5.11 MIL/uL   Hemoglobin 9.0 (L) 12.0 - 15.0 g/dL   HCT 62.1 (L) 30.8 - 65.7 %   MCV 100.7 (H) 80.0 - 100.0 fL   MCH 29.9 26.0 - 34.0 pg   MCHC 29.7 (L) 30.0 - 36.0 g/dL   RDW 84.6 (H) 96.2 - 95.2 %   Platelets 109 (L) 150 - 400 K/uL   nRBC 0.3 (H) 0.0 - 0.2 %   Neutrophils Relative % 71 %   Neutro Abs 5.4 1.7 - 7.7 K/uL   Lymphocytes Relative 9 %   Lymphs Abs 0.7 0.7 - 4.0 K/uL   Monocytes Relative 4 %   Monocytes Absolute 0.3 0.1 - 1.0 K/uL   Eosinophils Relative 13 %   Eosinophils Absolute 1.0 (H) 0.0 - 0.5 K/uL   Basophils Relative 3 %  Basophils Absolute 0.2 (H) 0.0 - 0.1 K/uL   WBC Morphology See Note     Comment: Morphology unremarkable   Smear Review See Note     Comment: Normal Platelet Morphology   nRBC 1 (H) 0 /100 WBC   Abs Immature Granulocytes 0.00 0.00 - 0.07 K/uL   Schistocytes PRESENT    Polychromasia PRESENT     Comment: Performed at [facility], [address], [place], [place] [address]  Glucose, capillary     Status: Abnormal   Collection Time: 03/31/23  8:27 AM  Result Value Ref Range   Glucose-Capillary 69 (L) 70 - 99 mg/dL    Comment: Glucose reference range applies only to samples taken after fasting for at least 8 hours.  Glucose, capillary     Status: Abnormal   Collection Time: 03/31/23  9:21 AM  Result Value Ref Range   Glucose-Capillary 134 (H) 70 - 99 mg/dL    Comment: Glucose reference range applies only to samples taken after fasting for at least 8 hours.  Glucose, capillary     Status: Abnormal   Collection Time: 03/31/23 11:16 AM  Result Value Ref Range    Glucose-Capillary 129 (H) 70 - 99 mg/dL    Comment: Glucose reference range applies only to samples taken after fasting for at least 8 hours.  Glucose, capillary     Status: Abnormal   Collection Time: 03/31/23  3:50 PM  Result Value Ref Range   Glucose-Capillary 161 (H) 70 - 99 mg/dL    Comment: Glucose reference range applies only to samples taken after fasting for at least 8 hours.  Glucose, capillary     Status: Abnormal   Collection Time: 03/31/23  8:00 PM  Result Value Ref Range   Glucose-Capillary 169 (H) 70 - 99 mg/dL    Comment: Glucose reference range applies only to samples taken after fasting for at least 8 hours.  Glucose, capillary     Status: Abnormal   Collection Time: 03/31/23 11:45 PM  Result Value Ref Range   Glucose-Capillary 127 (H) 70 - 99 mg/dL    Comment: Glucose reference range applies only to samples taken after fasting for at least 8 hours.  Glucose, capillary     Status: Abnormal   Collection Time: 04/01/23  3:28 AM  Result Value Ref Range   Glucose-Capillary 131 (H) 70 - 99 mg/dL    Comment: Glucose reference range applies only to samples taken after fasting for at least 8 hours.  Basic metabolic panel     Status: Abnormal   Collection Time: 04/01/23  4:59 AM  Result Value Ref Range   Sodium 129 (L) 135 - 145 mmol/L   Potassium 3.5 3.5 - 5.1 mmol/L   Chloride 96 (L) 98 - 111 mmol/L   CO2 25 22 - 32 mmol/L   Glucose, Bld 122 (H) 70 - 99 mg/dL    Comment: Glucose reference range applies only to samples taken after fasting for at least 8 hours.   BUN 21 8 - 23 mg/dL   Creatinine, Ser 6.04 (H) 0.44 - 1.00 mg/dL   Calcium 8.7 (L) 8.9 - 10.3 mg/dL   GFR, Estimated 7 (L) >60 mL/min    Comment: (NOTE) Calculated using the CKD-EPI Creatinine Equation (2021)    Anion gap 8 5 - 15    Comment: Performed at [facility], [address], [place], [place] [address]  CBC with Differential/Platelet     Status: Abnormal  Collection Time: 04/01/23  4:59 AM   Result Value Ref Range   WBC 5.9 4.0 - 10.5 K/uL   RBC 2.94 (L) 3.87 - 5.11 MIL/uL   Hemoglobin 8.7 (L) 12.0 - 15.0 g/dL   HCT 40.9 (L) 81.1 - 91.4 %   MCV 101.4 (H) 80.0 - 100.0 fL   MCH 29.6 26.0 - 34.0 pg   MCHC 29.2 (L) 30.0 - 36.0 g/dL   RDW 78.2 (H) 95.6 - 21.3 %   Platelets 96 (L) 150 - 400 K/uL    Comment: Immature Platelet Fraction may be clinically indicated, consider ordering this additional test [id number] REPEATED TO VERIFY    nRBC 0.0 0.0 - 0.2 %   Neutrophils Relative % 70 %   Neutro Abs 4.1 1.7 - 7.7 K/uL   Lymphocytes Relative 10 %   Lymphs Abs 0.6 (L) 0.7 - 4.0 K/uL   Monocytes Relative 8 %   Monocytes Absolute 0.5 0.1 - 1.0 K/uL   Eosinophils Relative 12 %   Eosinophils Absolute 0.7 (H) 0.0 - 0.5 K/uL   Basophils Relative 0 %   Basophils Absolute 0.0 0.0 - 0.1 K/uL   Immature Granulocytes 0 %   Abs Immature Granulocytes 0.02 0.00 - 0.07 K/uL    Comment: Performed at [facility], [address], [place], [place] [address]    Studies/Results: US Abdomen Limited RUQ (LIVER/GB) Result Date: 04/01/2023 CLINICAL DATA:  Right upper quadrant pain EXAM: ULTRASOUND ABDOMEN LIMITED RIGHT UPPER QUADRANT COMPARISON:  CT from the previous day. FINDINGS: Gallbladder: Gallbladder is decompressed with wall thickening. This is in part due to the decompressed state as well as underlying ascites. No cholelithiasis is seen. Common bile duct: Diameter: 1.9 mm. Liver: Increase in echogenicity consistent with fatty infiltration. Portal vein is patent on color Doppler imaging with normal direction of blood flow towards the liver. Other: None. IMPRESSION: Decompressed gallbladder with wall thickening accentuated by ascites. Correlate with the physical exam. HIDA scan may be helpful to assess for gallbladder function. Fatty liver. Electronically Signed   By: Alcide Clever M.D.   On: 04/01/2023 00:50   CT ABDOMEN PELVIS WO CONTRAST Result Date: 03/31/2023 CLINICAL DATA:  Acute  left-sided abdominal pain. EXAM: CT ABDOMEN AND PELVIS WITHOUT CONTRAST TECHNIQUE: Multidetector CT imaging of the abdomen and pelvis was performed following the standard protocol without IV contrast. RADIATION DOSE REDUCTION: This exam was performed according to the departmental dose-optimization program which includes automated exposure control, adjustment of the mA and/or kV according to patient size and/or use of iterative reconstruction technique. COMPARISON:  January 02, 2023. FINDINGS: Lower chest: No acute abnormality. Hepatobiliary: Nodular hepatic contours are noted suggesting hepatic cirrhosis. Moderate gallbladder wall thickening is noted without cholelithiasis. Pancreas: Unremarkable. No pancreatic ductal dilatation or surrounding inflammatory changes. Spleen: Normal in size without focal abnormality. Adrenals/Urinary Tract: Adrenal glands are unremarkable. Bilateral renal cysts are noted for which no further follow-up is required. No hydronephrosis or renal obstruction is noted. Stomach/Bowel: Stomach is unremarkable. There is no evidence of bowel obstruction or inflammation. The appendix appears normal. Diverticulosis of transverse colon is noted without inflammation. Vascular/Lymphatic: Aortic atherosclerosis. No enlarged abdominal or pelvic lymph nodes. Reproductive: Uterus and bilateral adnexa are unremarkable. Other: Minimal ascites is noted around the liver and spleen. No hernia. Musculoskeletal: Status post surgical posterior fusion of L3-4 with bilateral intrapedicular screw placement. 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 IMPRESSION: Nodular hepatic contours are noted suggesting hepatic cirrhosis. Moderate gallbladder wall thickening is noted  without cholelithiasis. Ultrasound is recommended for further evaluation. Minimal ascites. Diverticulosis of transverse colon without inflammation. Aortic Atherosclerosis (ICD10-I70.0). Electronically Signed   By:  Lupita Raider M.D.   On: 03/31/2023 16:38    Medications: I have reviewed the patient's current medications.  Assessment: Small-volume hematochezia thought to be related to hemorrhoids, hemoglobin stable at 8.7, hematochezia has resolved  Left upper quadrant abdominal pain, unclear etiology, CAT scan unremarkable  Nodular hepatic contour suggestive of hepatic cirrhosis, minimal ascites, currently does not have encephalopathy, did not have esophageal varices on EGD from 01/28/2023  Plan: Anemia may be multifactorial-history of of AVMs, end-stage renal disease on dialysis, cirrhosis of liver, this will likely be chronic and she may need periodic blood transfusions.  Currently does not have signs of encephalopathy but if she has confusion, recommend lactulose and Xifaxan.  No further recommendations from GI, I will sign off, please recall if needed.  Kerin Salen, MD 04/01/2023, 10:32 AM

## 2023-04-01 NOTE — Progress Notes (Addendum)
 Pt receives out-pt HD at [facility] on MWF. Will assist as needed.   Olivia Canter Renal Navigator [phone]  Addendum at 1:51 pm: D/C order noted. Order states to d/c after HD. Contacted [facility] to make staff aware of pt's d/c and that pt should resume care on Friday. It is too late for pt to make to clinic today and treatment tomorrow would likely prove difficult due to winter weather. Clinic has access to [facility] and can obtain needed clinicals.

## 2023-04-01 NOTE — Progress Notes (Signed)
 Pt return to unit from HD. Report given to oncoming shift.

## 2023-04-16 ENCOUNTER — Ambulatory Visit: Payer: Medicare PPO | Admitting: Pulmonary Disease

## 2023-04-16 ENCOUNTER — Encounter: Payer: Self-pay | Admitting: Pulmonary Disease

## 2023-04-20 ENCOUNTER — Other Ambulatory Visit: Payer: Self-pay

## 2023-04-20 ENCOUNTER — Emergency Department (HOSPITAL_COMMUNITY)

## 2023-04-20 ENCOUNTER — Inpatient Hospital Stay (HOSPITAL_COMMUNITY)
Admission: EM | Admit: 2023-04-20 | Discharge: 2023-05-12 | DRG: 871 | Disposition: E | Source: Ambulatory Visit | Attending: Pulmonary Disease | Admitting: Pulmonary Disease

## 2023-04-20 ENCOUNTER — Encounter (HOSPITAL_COMMUNITY): Payer: Self-pay

## 2023-04-20 DIAGNOSIS — I9589 Other hypotension: Secondary | ICD-10-CM | POA: Diagnosis not present

## 2023-04-20 DIAGNOSIS — J449 Chronic obstructive pulmonary disease, unspecified: Secondary | ICD-10-CM | POA: Diagnosis present

## 2023-04-20 DIAGNOSIS — I953 Hypotension of hemodialysis: Secondary | ICD-10-CM | POA: Diagnosis not present

## 2023-04-20 DIAGNOSIS — D631 Anemia in chronic kidney disease: Secondary | ICD-10-CM | POA: Diagnosis present

## 2023-04-20 DIAGNOSIS — J4489 Other specified chronic obstructive pulmonary disease: Secondary | ICD-10-CM | POA: Diagnosis present

## 2023-04-20 DIAGNOSIS — E119 Type 2 diabetes mellitus without complications: Secondary | ICD-10-CM

## 2023-04-20 DIAGNOSIS — Z87891 Personal history of nicotine dependence: Secondary | ICD-10-CM

## 2023-04-20 DIAGNOSIS — Z825 Family history of asthma and other chronic lower respiratory diseases: Secondary | ICD-10-CM

## 2023-04-20 DIAGNOSIS — I472 Ventricular tachycardia, unspecified: Secondary | ICD-10-CM | POA: Diagnosis not present

## 2023-04-20 DIAGNOSIS — I959 Hypotension, unspecified: Principal | ICD-10-CM | POA: Diagnosis present

## 2023-04-20 DIAGNOSIS — I469 Cardiac arrest, cause unspecified: Secondary | ICD-10-CM | POA: Insufficient documentation

## 2023-04-20 DIAGNOSIS — I462 Cardiac arrest due to underlying cardiac condition: Secondary | ICD-10-CM | POA: Diagnosis present

## 2023-04-20 DIAGNOSIS — R531 Weakness: Secondary | ICD-10-CM

## 2023-04-20 DIAGNOSIS — I2489 Other forms of acute ischemic heart disease: Secondary | ICD-10-CM | POA: Diagnosis present

## 2023-04-20 DIAGNOSIS — L4050 Arthropathic psoriasis, unspecified: Secondary | ICD-10-CM | POA: Diagnosis present

## 2023-04-20 DIAGNOSIS — Z801 Family history of malignant neoplasm of trachea, bronchus and lung: Secondary | ICD-10-CM

## 2023-04-20 DIAGNOSIS — E1122 Type 2 diabetes mellitus with diabetic chronic kidney disease: Secondary | ICD-10-CM | POA: Diagnosis present

## 2023-04-20 DIAGNOSIS — Z888 Allergy status to other drugs, medicaments and biological substances status: Secondary | ICD-10-CM

## 2023-04-20 DIAGNOSIS — I080 Rheumatic disorders of both mitral and aortic valves: Secondary | ICD-10-CM | POA: Diagnosis present

## 2023-04-20 DIAGNOSIS — T465X5A Adverse effect of other antihypertensive drugs, initial encounter: Secondary | ICD-10-CM | POA: Diagnosis present

## 2023-04-20 DIAGNOSIS — L409 Psoriasis, unspecified: Secondary | ICD-10-CM | POA: Diagnosis present

## 2023-04-20 DIAGNOSIS — E11649 Type 2 diabetes mellitus with hypoglycemia without coma: Secondary | ICD-10-CM | POA: Diagnosis present

## 2023-04-20 DIAGNOSIS — R570 Cardiogenic shock: Secondary | ICD-10-CM

## 2023-04-20 DIAGNOSIS — R112 Nausea with vomiting, unspecified: Secondary | ICD-10-CM | POA: Diagnosis not present

## 2023-04-20 DIAGNOSIS — R451 Restlessness and agitation: Secondary | ICD-10-CM | POA: Diagnosis not present

## 2023-04-20 DIAGNOSIS — D721 Eosinophilia, unspecified: Secondary | ICD-10-CM

## 2023-04-20 DIAGNOSIS — A419 Sepsis, unspecified organism: Secondary | ICD-10-CM | POA: Diagnosis not present

## 2023-04-20 DIAGNOSIS — Z981 Arthrodesis status: Secondary | ICD-10-CM

## 2023-04-20 DIAGNOSIS — E871 Hypo-osmolality and hyponatremia: Secondary | ICD-10-CM | POA: Diagnosis present

## 2023-04-20 DIAGNOSIS — I132 Hypertensive heart and chronic kidney disease with heart failure and with stage 5 chronic kidney disease, or end stage renal disease: Secondary | ICD-10-CM | POA: Diagnosis present

## 2023-04-20 DIAGNOSIS — M159 Polyosteoarthritis, unspecified: Secondary | ICD-10-CM | POA: Diagnosis present

## 2023-04-20 DIAGNOSIS — I25709 Atherosclerosis of coronary artery bypass graft(s), unspecified, with unspecified angina pectoris: Secondary | ICD-10-CM | POA: Diagnosis present

## 2023-04-20 DIAGNOSIS — Z7984 Long term (current) use of oral hypoglycemic drugs: Secondary | ICD-10-CM

## 2023-04-20 DIAGNOSIS — T82857A Stenosis of cardiac prosthetic devices, implants and grafts, initial encounter: Secondary | ICD-10-CM | POA: Diagnosis present

## 2023-04-20 DIAGNOSIS — Z79899 Other long term (current) drug therapy: Secondary | ICD-10-CM

## 2023-04-20 DIAGNOSIS — I480 Paroxysmal atrial fibrillation: Secondary | ICD-10-CM | POA: Diagnosis present

## 2023-04-20 DIAGNOSIS — M109 Gout, unspecified: Secondary | ICD-10-CM | POA: Diagnosis present

## 2023-04-20 DIAGNOSIS — Z66 Do not resuscitate: Secondary | ICD-10-CM | POA: Diagnosis present

## 2023-04-20 DIAGNOSIS — I2510 Atherosclerotic heart disease of native coronary artery without angina pectoris: Secondary | ICD-10-CM | POA: Diagnosis present

## 2023-04-20 DIAGNOSIS — Z87442 Personal history of urinary calculi: Secondary | ICD-10-CM

## 2023-04-20 DIAGNOSIS — E860 Dehydration: Secondary | ICD-10-CM

## 2023-04-20 DIAGNOSIS — Z833 Family history of diabetes mellitus: Secondary | ICD-10-CM

## 2023-04-20 DIAGNOSIS — T17918A Gastric contents in respiratory tract, part unspecified causing other injury, initial encounter: Secondary | ICD-10-CM | POA: Diagnosis not present

## 2023-04-20 DIAGNOSIS — D65 Disseminated intravascular coagulation [defibrination syndrome]: Secondary | ICD-10-CM | POA: Diagnosis present

## 2023-04-20 DIAGNOSIS — Z91018 Allergy to other foods: Secondary | ICD-10-CM

## 2023-04-20 DIAGNOSIS — K219 Gastro-esophageal reflux disease without esophagitis: Secondary | ICD-10-CM | POA: Diagnosis present

## 2023-04-20 DIAGNOSIS — Z720 Tobacco use: Secondary | ICD-10-CM | POA: Diagnosis present

## 2023-04-20 DIAGNOSIS — G8929 Other chronic pain: Secondary | ICD-10-CM | POA: Diagnosis present

## 2023-04-20 DIAGNOSIS — Z96652 Presence of left artificial knee joint: Secondary | ICD-10-CM | POA: Diagnosis present

## 2023-04-20 DIAGNOSIS — I10 Essential (primary) hypertension: Secondary | ICD-10-CM | POA: Diagnosis present

## 2023-04-20 DIAGNOSIS — M549 Dorsalgia, unspecified: Secondary | ICD-10-CM | POA: Diagnosis present

## 2023-04-20 DIAGNOSIS — K224 Dyskinesia of esophagus: Secondary | ICD-10-CM | POA: Diagnosis present

## 2023-04-20 DIAGNOSIS — Y831 Surgical operation with implant of artificial internal device as the cause of abnormal reaction of the patient, or of later complication, without mention of misadventure at the time of the procedure: Secondary | ICD-10-CM | POA: Diagnosis present

## 2023-04-20 DIAGNOSIS — E874 Mixed disorder of acid-base balance: Secondary | ICD-10-CM | POA: Diagnosis present

## 2023-04-20 DIAGNOSIS — Z515 Encounter for palliative care: Secondary | ICD-10-CM

## 2023-04-20 DIAGNOSIS — B789 Strongyloidiasis, unspecified: Secondary | ICD-10-CM | POA: Diagnosis present

## 2023-04-20 DIAGNOSIS — I5032 Chronic diastolic (congestive) heart failure: Secondary | ICD-10-CM | POA: Diagnosis present

## 2023-04-20 DIAGNOSIS — N2581 Secondary hyperparathyroidism of renal origin: Secondary | ICD-10-CM | POA: Diagnosis present

## 2023-04-20 DIAGNOSIS — E876 Hypokalemia: Secondary | ICD-10-CM | POA: Diagnosis present

## 2023-04-20 DIAGNOSIS — I272 Pulmonary hypertension, unspecified: Secondary | ICD-10-CM | POA: Diagnosis present

## 2023-04-20 DIAGNOSIS — E1151 Type 2 diabetes mellitus with diabetic peripheral angiopathy without gangrene: Secondary | ICD-10-CM | POA: Diagnosis present

## 2023-04-20 DIAGNOSIS — Z604 Social exclusion and rejection: Secondary | ICD-10-CM | POA: Diagnosis present

## 2023-04-20 DIAGNOSIS — R630 Anorexia: Secondary | ICD-10-CM | POA: Diagnosis present

## 2023-04-20 DIAGNOSIS — K529 Noninfective gastroenteritis and colitis, unspecified: Secondary | ICD-10-CM | POA: Diagnosis present

## 2023-04-20 DIAGNOSIS — Z5982 Transportation insecurity: Secondary | ICD-10-CM

## 2023-04-20 DIAGNOSIS — Z808 Family history of malignant neoplasm of other organs or systems: Secondary | ICD-10-CM

## 2023-04-20 DIAGNOSIS — N186 End stage renal disease: Secondary | ICD-10-CM

## 2023-04-20 DIAGNOSIS — Z7982 Long term (current) use of aspirin: Secondary | ICD-10-CM

## 2023-04-20 DIAGNOSIS — D696 Thrombocytopenia, unspecified: Secondary | ICD-10-CM | POA: Diagnosis present

## 2023-04-20 DIAGNOSIS — Z8249 Family history of ischemic heart disease and other diseases of the circulatory system: Secondary | ICD-10-CM

## 2023-04-20 DIAGNOSIS — K922 Gastrointestinal hemorrhage, unspecified: Secondary | ICD-10-CM | POA: Diagnosis present

## 2023-04-20 DIAGNOSIS — R54 Age-related physical debility: Secondary | ICD-10-CM | POA: Diagnosis present

## 2023-04-20 DIAGNOSIS — J9621 Acute and chronic respiratory failure with hypoxia: Secondary | ICD-10-CM | POA: Diagnosis present

## 2023-04-20 DIAGNOSIS — Z953 Presence of xenogenic heart valve: Secondary | ICD-10-CM

## 2023-04-20 DIAGNOSIS — I4891 Unspecified atrial fibrillation: Secondary | ICD-10-CM | POA: Diagnosis present

## 2023-04-20 DIAGNOSIS — J9622 Acute and chronic respiratory failure with hypercapnia: Secondary | ICD-10-CM | POA: Diagnosis present

## 2023-04-20 DIAGNOSIS — K7460 Unspecified cirrhosis of liver: Secondary | ICD-10-CM | POA: Diagnosis present

## 2023-04-20 DIAGNOSIS — G4733 Obstructive sleep apnea (adult) (pediatric): Secondary | ICD-10-CM | POA: Diagnosis present

## 2023-04-20 DIAGNOSIS — Z992 Dependence on renal dialysis: Secondary | ICD-10-CM

## 2023-04-20 LAB — I-STAT CHEM 8, ED
BUN: 14 mg/dL (ref 8–23)
Calcium, Ion: 1.25 mmol/L (ref 1.15–1.40)
Chloride: 100 mmol/L (ref 98–111)
Creatinine, Ser: 7.3 mg/dL — ABNORMAL HIGH (ref 0.44–1.00)
Glucose, Bld: 64 mg/dL — ABNORMAL LOW (ref 70–99)
HCT: 44 % (ref 36.0–46.0)
Hemoglobin: 15 g/dL (ref 12.0–15.0)
Potassium: 3.2 mmol/L — ABNORMAL LOW (ref 3.5–5.1)
Sodium: 135 mmol/L (ref 135–145)
TCO2: 25 mmol/L (ref 22–32)

## 2023-04-20 LAB — CBC
HCT: 44.6 % (ref 36.0–46.0)
Hemoglobin: 13.1 g/dL (ref 12.0–15.0)
MCH: 29.2 pg (ref 26.0–34.0)
MCHC: 29.4 g/dL — ABNORMAL LOW (ref 30.0–36.0)
MCV: 99.3 fL (ref 80.0–100.0)
Platelets: 62 10*3/uL — ABNORMAL LOW (ref 150–400)
RBC: 4.49 MIL/uL (ref 3.87–5.11)
RDW: 19.7 % — ABNORMAL HIGH (ref 11.5–15.5)
WBC: 13.8 10*3/uL — ABNORMAL HIGH (ref 4.0–10.5)
nRBC: 0.2 % (ref 0.0–0.2)

## 2023-04-20 LAB — BASIC METABOLIC PANEL
Anion gap: 10 (ref 5–15)
BUN: 13 mg/dL (ref 8–23)
CO2: 27 mmol/L (ref 22–32)
Calcium: 9.8 mg/dL (ref 8.9–10.3)
Chloride: 101 mmol/L (ref 98–111)
Creatinine, Ser: 6.51 mg/dL — ABNORMAL HIGH (ref 0.44–1.00)
GFR, Estimated: 7 mL/min — ABNORMAL LOW (ref >60)
Glucose, Bld: 68 mg/dL — ABNORMAL LOW (ref 70–99)
Potassium: 3.2 mmol/L — ABNORMAL LOW (ref 3.5–5.1)
Sodium: 138 mmol/L (ref 135–145)

## 2023-04-20 LAB — PROTIME-INR
INR: 1.7 — ABNORMAL HIGH (ref 0.8–1.2)
Prothrombin Time: 19.8 s — ABNORMAL HIGH (ref 11.4–15.2)

## 2023-04-20 LAB — I-STAT CG4 LACTIC ACID, ED: Lactic Acid, Venous: 1.6 mmol/L (ref 0.5–1.9)

## 2023-04-20 LAB — HEPATIC FUNCTION PANEL
ALT: 9 U/L (ref 0–44)
AST: 19 U/L (ref 15–41)
Albumin: 2.3 g/dL — ABNORMAL LOW (ref 3.5–5.0)
Alkaline Phosphatase: 81 U/L (ref 38–126)
Bilirubin, Direct: 0.2 mg/dL (ref 0.0–0.2)
Indirect Bilirubin: 0.6 mg/dL (ref 0.3–0.9)
Total Bilirubin: 0.8 mg/dL (ref 0.0–1.2)
Total Protein: 5.3 g/dL — ABNORMAL LOW (ref 6.5–8.1)

## 2023-04-20 LAB — TROPONIN I (HIGH SENSITIVITY)
Troponin I (High Sensitivity): 182 ng/L (ref <18)
Troponin I (High Sensitivity): 151 ng/L (ref <18)

## 2023-04-20 LAB — CBG MONITORING, ED: Glucose-Capillary: 76 mg/dL (ref 70–99)

## 2023-04-20 MED ORDER — ACETAMINOPHEN 650 MG RE SUPP: 650.0000 mg | Freq: Four times a day (QID) | RECTAL | Status: DC | PRN

## 2023-04-20 MED ORDER — CINACALCET HCL 30 MG PO TABS
60.0000 mg | ORAL_TABLET | ORAL | Status: DC
  Filled 2023-04-20: qty 2

## 2023-04-20 MED ORDER — ALBUTEROL SULFATE (2.5 MG/3ML) 0.083% IN NEBU
3.0000 mL | INHALATION_SOLUTION | Freq: Four times a day (QID) | RESPIRATORY_TRACT | Status: DC | PRN
  Administered 2023-04-21 (×2): 3 mL via RESPIRATORY_TRACT
  Filled 2023-04-20 (×2): qty 3

## 2023-04-20 MED ORDER — UMECLIDINIUM BROMIDE 62.5 MCG/ACT IN AEPB
1.0000 | INHALATION_SPRAY | Freq: Every day | RESPIRATORY_TRACT | Status: DC
  Administered 2023-04-21: 1 via RESPIRATORY_TRACT
  Filled 2023-04-20: qty 7

## 2023-04-20 MED ORDER — MIDODRINE HCL 5 MG PO TABS
5.0000 mg | ORAL_TABLET | Freq: Three times a day (TID) | ORAL | Status: DC
  Administered 2023-04-20: 5 mg via ORAL
  Filled 2023-04-20 (×2): qty 1

## 2023-04-20 MED ORDER — CINACALCET HCL 30 MG PO TABS
60.0000 mg | ORAL_TABLET | ORAL | Status: DC
Start: 2023-04-22 — End: 2023-04-20

## 2023-04-20 MED ORDER — BUSPIRONE HCL 10 MG PO TABS
5.0000 mg | ORAL_TABLET | Freq: Three times a day (TID) | ORAL | Status: DC
  Administered 2023-04-20 – 2023-04-23 (×10): 5 mg via ORAL
  Filled 2023-04-20 (×11): qty 1

## 2023-04-20 MED ORDER — ACETAMINOPHEN 325 MG PO TABS
650.0000 mg | ORAL_TABLET | Freq: Four times a day (QID) | ORAL | Status: DC | PRN
  Administered 2023-04-21 – 2023-04-22 (×4): 650 mg via ORAL
  Filled 2023-04-20 (×5): qty 2

## 2023-04-20 MED ORDER — PANTOPRAZOLE SODIUM 40 MG IV SOLR
40.0000 mg | Freq: Two times a day (BID) | INTRAVENOUS | Status: DC
  Administered 2023-04-20: 40 mg via INTRAVENOUS
  Filled 2023-04-20: qty 10

## 2023-04-20 MED ORDER — ARFORMOTEROL TARTRATE 15 MCG/2ML IN NEBU
15.0000 ug | INHALATION_SOLUTION | Freq: Two times a day (BID) | RESPIRATORY_TRACT | Status: DC
  Administered 2023-04-21 – 2023-04-25 (×7): 15 ug via RESPIRATORY_TRACT
  Filled 2023-04-20 (×8): qty 2

## 2023-04-20 MED ORDER — CINACALCET HCL 30 MG PO TABS
90.0000 mg | ORAL_TABLET | ORAL | Status: DC
  Administered 2023-04-22: 90 mg via ORAL
  Filled 2023-04-20: qty 3

## 2023-04-20 MED ORDER — SODIUM CHLORIDE 0.9% FLUSH
3.0000 mL | Freq: Two times a day (BID) | INTRAVENOUS | Status: DC
  Administered 2023-04-21 – 2023-04-25 (×7): 3 mL via INTRAVENOUS

## 2023-04-20 NOTE — ED Notes (Signed)
Got patient on the monitor into a gown patient is resting with call bell in reach  

## 2023-04-20 NOTE — Consult Note (Signed)
 [facility] Renal Consultation Note  Requesting MD: Irena Cords, MD Indication for Consultation:  ESRD and hypotension with HD   Chief complaint: shortness of breath  HPI:  Jodi Bell is a 63 y.o. female with a history of ESRD on hemodialysis Monday Wednesday Friday, COPD, type 2 diabetes mellitus, obstructive sleep apnea, and PAD who presented to the hospital from dialysis with hypotension and shortness of breath.  She stopped HD mid-treatment today due to low blood pressures and nausea with dry heaving.  She states that she felt short of breath but her pulse ox was fine.  She was concerned that she might lose control of her bowels.  This happened the last few HD treatments including Friday and today.  Stopped HD today after just 40 minutes. Note that she has had to have dental extractions recently.  She has not been taking all of her BP medications because her BP has been low - she previously was on a clonidine patch and stopped this two weeks ago. She states that she thinks she has lost weight because she is not eating well - she's had just one meal a day for the past couple of weeks and she self-reports her EDW as 58 kg.  She is on coreg, nifedipine, and imdur.  The ER attending, Dr. Anitra Lauth, indicates that her blood pressure was initially in the 70s and this improved with a bolus in the ER; I am unable to see a fluid bolus charted.  She did get a dose of midodrine once.  She has been on 2 liters oxygen with sats charted at 100% and states that she is always on 2-3 liters oxygen.  She has been told recently of concern for cirrhosis.   She dialyzes at [facility] in [place] through [place]    PMHx:   Past Medical History:  Diagnosis Date   Anemia of chronic disease    Anxiety    Arthritis    oa and psoriatic ra   Asthma    Back pain, chronic    lower back   Chronic insomnia    COPD (chronic obstructive pulmonary disease) (HCC)    Diabetes mellitus    type  2   ESRD on dialysis [facility])    Essential hypertension    GERD (gastroesophageal reflux disease)    Gout    History of hiatal hernia    Hypoalbuminemia    Hyponatremia    Mild aortic stenosis    Mitral regurgitation    Mitral stenosis    Obesity    PAD (peripheral artery disease) (HCC)    a. externial iliac calcification seen on CT 04/2020.   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 Lynnette Caffey & Laneta Simmers.   Sleep apnea    Tobacco abuse    Upper GI bleed 05/2019    Past Surgical History:  Procedure Laterality Date   BACK SURGERY  2016   lower back fusion with cage   BIOPSY  09/23/2017   Procedure: BIOPSY;  Surgeon: Graylin Shiver, MD;  Location: [facility] ENDOSCOPY;  Service: Endoscopy;;   BIOPSY  05/19/2019   Procedure: BIOPSY;  Surgeon: Kathi Der, MD;  Location: [facility] ENDOSCOPY;  Service: Gastroenterology;;   CESAREAN SECTION  1990   x 1    COLONOSCOPY WITH PROPOFOL N/A 09/23/2017   Procedure: COLONOSCOPY WITH PROPOFOL Hemostatic clips placed;  Surgeon: Graylin Shiver, MD;  Location: [facility] ENDOSCOPY;  Service: Endoscopy;  Laterality: N/A;   COLONOSCOPY WITH PROPOFOL N/A 10/02/2020   Procedure: COLONOSCOPY WITH PROPOFOL;  Surgeon: Willis Modena, MD;  Location: [facility] ENDOSCOPY;  Service: Endoscopy;  Laterality: N/A;   CORONARY ARTERY BYPASS GRAFT N/A 09/13/2020   Procedure: CORONARY ARTERY BYPASS GRAFTING (CABG), ON PUMP, TIMES TWO, USING LEFT INTERNAL MAMMARY ARTERY AND RIGHT ENDOSCOPICALLY HARVESTED GREATER SAPHENOUS VEIN;  Surgeon: Alleen Borne, MD;  Location: [facility] OR;  Service: Open Heart Surgery;  Laterality: N/A;   DILATION AND CURETTAGE OF UTERUS  1988   ENTEROSCOPY N/A 07/22/2021   Procedure: ENTEROSCOPY;  Surgeon: Kerin Salen, MD;  Location: [facility] ENDOSCOPY;  Service: Gastroenterology;  Laterality: N/A;   ENTEROSCOPY N/A 01/28/2023   Procedure: ENTEROSCOPY;  Surgeon: [name], MD;  Location: [facility] ENDOSCOPY;   Service: Gastroenterology;  Laterality: N/A;   ESOPHAGOGASTRODUODENOSCOPY N/A 09/22/2020   Procedure: ESOPHAGOGASTRODUODENOSCOPY (EGD);  Surgeon: Willis Modena, MD;  Location: [facility] ENDOSCOPY;  Service: Endoscopy;  Laterality: N/A;   ESOPHAGOGASTRODUODENOSCOPY (EGD) WITH PROPOFOL N/A 09/23/2017   Procedure: ESOPHAGOGASTRODUODENOSCOPY (EGD) WITH PROPOFOL;  Surgeon: Graylin Shiver, MD;  [facility]: [facility];  Service: Endoscopy;  Laterality: N/A;   ESOPHAGOGASTRODUODENOSCOPY (EGD) WITH PROPOFOL N/A 05/19/2019   Procedure: ESOPHAGOGASTRODUODENOSCOPY (EGD) WITH PROPOFOL;  Surgeon: Kathi Der, MD;  Location: [facility];  Service: Gastroenterology;  Laterality: N/A;   ESOPHAGOGASTRODUODENOSCOPY (EGD) WITH PROPOFOL N/A 10/02/2020   Procedure: ESOPHAGOGASTRODUODENOSCOPY (EGD) WITH PROPOFOL;  Surgeon: Willis Modena, MD;  Location: [facility] ENDOSCOPY;  Service: Endoscopy;  Laterality: N/A;   GIVENS CAPSULE STUDY N/A 05/19/2019   Procedure: GIVENS CAPSULE STUDY;  Surgeon: Kathi Der, MD;  Location: [facility] ENDOS[facility];  Service: Gastroenterology;  Laterality: N/A;   GIVENS CAPSULE STUDY N/A 07/19/2021   Procedure: GIVENS CAPSULE STUDY;  Surgeon: [name], MD;  Location: [facility] ENDOSCOPY;  Service: Gastroenterology;  Laterality: N/A;   HEMOSTASIS CLIP PLACEMENT  09/22/2020   Procedure: HEMOSTASIS CLIP PLACEMENT;  Surgeon: Willis Modena, MD;  [facility]: [facility]OS[facility];  Service: Endoscopy;;   HEMOSTASIS CONTROL  09/22/2020   Procedure: HEMOSTASIS CONTROL;  Surgeon: Willis Modena, MD;  Location: [facility]COPY;  Service: Endoscopy;;   HOT HEMOSTASIS N/A 05/19/2019   Procedure: HOT HEMOSTASIS (ARGON PLASMA COAGULATION/BICAP);  Surgeon: Kathi Der, MD;  Location: [facility] ENDOSCOPY;  Service: Gastroenterology;  Laterality: N/A;   HOT HEMOSTASIS N/A 01/28/2023   Procedure: HOT HEMOSTASIS (ARGON PLASMA COAGULATION/BICAP);  Surgeon: [name], MD;  Location: [facility] ENDOSCOPY;  Service: Gastroenterology;  Laterality: N/A;    INTRAOPERATIVE TRANSTHORACIC ECHOCARDIOGRAM N/A 12/23/2022   Procedure: INTRAOPERATIVE TRANSTHORACIC ECHOCARDIOGRAM;  Surgeon: [name], MD;  [facility] INVASIVE CV LAB;  Service: Cardiovascular;  Laterality: N/A;   LEFT HEART CATH AND CORONARY ANGIOGRAPHY N/A 09/10/2020   Procedure: LEFT HEART CATH AND CORONARY ANGIOGRAPHY;  Surgeon: Yvonne Kendall, MD;  [facility] INVAS[facility] CV LAB;  Service: Cardiovascular;  Laterality: N/A;   PARTIAL KNEE ARTHROPLASTY Left 11/12/2017   Procedure: left unicompartmental arthroplasty-medial;  Surgeon: Durene Romans, MD;  Location: [facility] ORS;  Service: Orthopedics;  Laterality: Left;    RIGHT HEART CATH AND CORONARY/GRAFT ANGIOGRAPHY N/A 06/26/2022   Procedure: RIGHT HEART CATH AND CORONARY/GRAFT ANGIOGRAPHY;  Surgeon: [name], MD;  [facility] CV LAB;  Service: Cardiovascular;  Laterality: N/A;   SUBMUCOSAL INJECTION  09/23/2017   Procedure: SUBMUCOSAL INJECTION;  Surgeon: Graylin Shiver, MD;  Location: [facility] ENDOS[facility];  Service: Endoscopy;;  in colon   TEE WITHOUT CARDIOVERSION N/A 09/13/2020   Procedure: TRANSESOPHAGEAL ECHOCARDIOGRAM (TEE);  Surgeon:  Alleen Borne, MD;  Location: [facility] OR;  Service: Open Heart Surgery;  Laterality: N/A;   TUBAL LIGATION  1990    Family Hx:  Family History  Problem Relation Age of Onset   Lung cancer Mother    Diabetes Sister    Heart disease Sister    Asthma Daughter    Arthritis Daughter    Arthritis Daughter    Thyroid cancer Other        1/2 sister   Heart disease Other        1/2 sister    Social History:  reports that she quit smoking about 2 years ago. Her smoking use included cigarettes. She started smoking about 42 years ago. She has a 20 pack-year smoking history. She has never used smokeless tobacco. She reports current alcohol use. She reports that she does not use drugs.  Allergies:  Allergies  Allergen Reactions   3-Methyl-2-Benzothiazolinone Hydrazone Other (See Comments)     Muscle weakness muscle cramping in legs Other reaction(s): Myalgias (Muscle Pain)   Banana Anaphylaxis, Swelling and Other (See Comments)    TONGUE AND MOUTH SWELLING   Black Buyer, retail (Non-Screening) Anaphylaxis and Itching    Walnuts   Hazelnut ([place]) Anaphylaxis    Hazelnuts   Leflunomide And Related Other (See Comments)    Severe Headache   Lisinopril Swelling    Angioedema  Swelling of the face   No Healtht[employer] Food Allergies Anaphylaxis    Spicy Mustard 4.7.2021 Pt reports that she eats Regular Yellow Mustard Swelling and itching of tongue   Other Anaphylaxis, Swelling and Other (See Comments)    Cosentyx= angioedema Mouth itches and tongue swelling Hazel nuts and pecans also Walnuts Renal failure Serotonin syndrome  Tremors Muscle weakness muscle cramping in legs Other reaction(s): Myalgias (Muscle Pain)   Pecan Nut (Diagnostic) Anaphylaxis and Itching    Pecans   Trazodone And Nefazodone Other (See Comments)    Serotonin Syndrome  Tremors   Adalimumab Other (See Comments)    Blisters on abdomen =humira   Escitalopram Oxalate Other (See Comments)    Hand problems - Serotonin Syndrome     Ezetimibe Other (See Comments)    myalgias cramps    Secukinumab Swelling    Cosentyx = angiodema    Statins Other (See Comments)    Leg pains and weakness   Ferrlecit [Na Ferric Gluc Cplx In Sucrose]     Not reaction given from HD   Gabapentin Other (See Comments)    tremors   Nsaids     Avoid due to renal problems     Medications: Prior to Admission medications   Medication Sig Start Date End Date Taking? Authorizing Provider  acetaminophen (TYLENOL) 500 MG tablet Take 1,000 mg by mouth daily as needed (pain).    [provider]  albuterol (PROVENTIL) (2.5 MG/3ML) 0.083% nebulizer solution Take 3 mLs (2.5 mg total) by nebulization every 6 (six) hours as needed for wheezing or shortness of breath. 07/10/21   Hunsucker, Lesia Sago, MD   allopurinol (ZYLOPRIM) 100 MG tablet Take 100 mg by mouth every evening.    [provider]  Artificial Tear Solution (TEARS NATURALE OP) Place 1 drop into both eyes 4 (four) times daily as needed (dry eye).    [provider]  aspirin 81 MG chewable tablet Chew 1 tablet (81 mg total) by mouth every other day. 01/07/23   Monna Fam, MD  busPIRone (BUSPAR) 5 MG tablet Take 5 mg by  mouth 3 (three) times daily. 01/01/23 01/01/24  [provider]  carvedilol (COREG) 25 MG tablet Take 1 tablet (25 mg total) by mouth 2 (two) times daily with a meal. 12/25/22   Janetta Hora, PA-C  cetirizine (ZYRTEC) 10 MG tablet Take 10 mg by mouth daily as needed for allergies.    [provider]  Cholecalciferol (VITAMIN D) 50 MCG (2000 UT) tablet Take 2,000 Units by mouth daily.    [provider]  cinacalcet (SENSIPAR) 30 MG tablet Take 60 mg by mouth every Monday, Wednesday, and Friday with hemodialysis. Pt states she takes 2 tab on Mon, 3 tab on Wed, and 2 tab on Fri.    [provider]  cloNIDine (CATAPRES - DOSED IN MG/24 HR) 0.3 mg/24hr patch Place 0.3 mg onto the skin every Sunday.    [provider]  diclofenac Sodium (VOLTAREN) 1 % GEL Apply 2 g topically daily as needed (pain).    [provider]  docusate sodium (COLACE) 100 MG capsule Take 100 mg by mouth daily as needed for moderate constipation.    [provider]  EPINEPHrine 0.3 mg/0.3 mL IJ SOAJ injection Inject into the muscle as needed for anaphylaxis. Patient not taking: Reported on 03/28/2023 07/12/21   [provider]  fluticasone (FLONASE) 50 MCG/ACT nasal spray Place 1 spray into both nostrils daily as needed for allergies or rhinitis.    [provider]  hydrocortisone 2.5%-nystatin-zinc oxide 20% 1:1:1 ointment mixture Apply topically daily as needed. Patient not taking: Reported on 03/28/2023 01/07/23   Monna Fam, MD  isosorbide  mononitrate (IMDUR) 60 MG 24 hr tablet Take 1 tablet (60 mg total) by mouth daily. 10/27/22   Jake Bathe, MD  LORazepam (ATIVAN) 0.5 MG tablet Take 0.5 mg by mouth 3 (three) times daily.    [provider]  montelukast (SINGULAIR) 10 MG tablet Take 10 mg by mouth daily with supper.     [provider]  multivitamin (RENA-VIT) TABS tablet Take 1 tablet by mouth daily.    [provider]  NIFEdipine (PROCARDIA XL/NIFEDICAL XL) 60 MG 24 hr tablet Take 60 mg by mouth 2 (two) times daily. 10/01/21   [provider]  olmesartan (BENICAR) 40 MG tablet Take 40 mg by mouth every evening. 05/06/19   [provider]  ondansetron (ZOFRAN) 4 MG tablet Take 4 mg by mouth 2 (two) times daily as needed for nausea or vomiting.    [provider]  OXYGEN Inhale 2-3 L into the lungs continuous.    [provider]  pantoprazole (PROTONIX) 40 MG tablet Take 40 mg by mouth 2 (two) times daily. 06/09/20   [provider]  predniSONE (DELTASONE) 5 MG tablet Take 1 tablet (5 mg total) by mouth daily with breakfast. 02/27/22   Marrianne Mood, MD  Probiotic Product (PROBIOTIC PO) Take 1 capsule by mouth daily.    [provider]  silver sulfADIAZINE (SILVADENE) 1 % cream Apply 1 Application topically daily as needed (to prevent wound).    [provider]  Tiotropium Bromide-Olodaterol (STIOLTO RESPIMAT) 2.5-2.5 MCG/ACT AERS Inhale 2 puffs once daily into the lungs. 11/28/21   Hunsucker, Lesia Sago, MD  VENTOLIN HFA 108 (90 Base) MCG/ACT inhaler Inhale 2 puffs into the lungs See admin instructions. Take 2 puffs every 4-6 hours as needed for wheezing and shortness of breath 04/08/22   Hunsucker, Lesia Sago, MD  zolpidem (AMBIEN) 5 MG tablet Take 5 mg by mouth at  bedtime as needed for sleep. 02/04/21   [provider]   I have reviewed the patient's current and reported prior to admission medications.  Labs:     Latest Ref Rng &  Units 04/20/2023   10:10 AM 04/20/2023    9:50 AM 04/01/2023    4:59 AM  BMP  Glucose 70 - 99 mg/dL 64  68  914   BUN 8 - 23 mg/dL 14  13  21    Creatinine 0.44 - 1.00 mg/dL 7.82  9.56  2.13   Sodium 135 - 145 mmol/L 135  138  129   Potassium 3.5 - 5.1 mmol/L 3.2  3.2  3.5   Chloride 98 - 111 mmol/L 100  101  96   CO2 22 - 32 mmol/L  27  25   Calcium 8.9 - 10.3 mg/dL  9.8  8.7     Urinalysis    Component Value Date/Time   COLORURINE YELLOW 09/12/2020 2333   APPEARANCEUR HAZY (A) 09/12/2020 2333   LABSPEC 1.012 09/12/2020 2333   PHURINE 8.0 09/12/2020 2333   GLUCOSEU NEGATIVE 09/12/2020 2333   HGBUR SMALL (A) 09/12/2020 2333   BILIRUBINUR NEGATIVE 09/12/2020 2333   KETONESUR NEGATIVE 09/12/2020 2333   PROTEINUR 100 (A) 09/12/2020 2333   UROBILINOGEN 0.2 09/05/2014 0013   NITRITE NEGATIVE 09/12/2020 2333   LEUKOCYTESUR NEGATIVE 09/12/2020 2333     ROS:  Pertinent items noted in HPI and remainder of comprehensive ROS otherwise negative.  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; bruit and thrill appreciated   Assessment/Plan:  # ESRD - HD per MWF schedule  - No acute indication for HD and had a short treatment outpatient earlier today   - Will need to call outpatient HD unit tomorrow for her rx as well as to get any updated information from nursing staff there - optimize UF with  HD off of anti-hypertensives or on a reduced dose of anti-hypertensives  # Hypotension - Improved most recently - Note hx of HTN and she is usually on multiple anti-hypertensives (clonidine, coreg, imdur, nifedipine) - Would hold her hold medications and would add back as needed in a step-wise fashion, beginning with the carvedilol.  Would re-introduce carvedilol at a lower dose  - Note blood cultures sent as well   # Hypokalemia - Labs today were obtained after HD - Would defer repletion  - Can repeat CMP in AM as ordered to reassess K  # Chronic hypoxic respiratory failure  - She is usually on 2-3 liters oxygen at home  # Secondary hyperparathyroidism, renal  - She is on sensipar per what is reported as her home regimen    Estanislado Emms 04/20/2023, 9:01 PM

## 2023-04-20 NOTE — Assessment & Plan Note (Signed)
 Stable, Prn albuterol.

## 2023-04-20 NOTE — Assessment & Plan Note (Signed)
 Pt is followed by GI, and will defer if consult needed. Unclear if this is related to NASH.

## 2023-04-20 NOTE — ED Notes (Signed)
MD Patel at bedside.

## 2023-04-20 NOTE — Assessment & Plan Note (Signed)
 SR with prolonged pr . No anticoagulation except for aspirin.

## 2023-04-20 NOTE — ED Provider Notes (Signed)
 [place] EMERGENCY DEPARTMENT AT [facility] Provider Note   CSN: [id number] Arrival date & time: 04/20/23  4098     History  Chief Complaint  Patient presents with   Hypotension   Weakness    Jodi Bell is a 63 y.o. female.   Weakness Associated symptoms: chest pain and shortness of breath   Patient presents for hypertension.  Medical history includes ESRD, HTN, CAD, atrial fibrillation, DM, COPD, CHF, GI bleeding, aortic stenosis.  She underwent CABG in 2022.  She underwent TAVR last November.  Home blood pressure medications include clonidine, Coreg, hydralazine, Imdur, nifedipine, olmesartan.  Patient states that she typically takes these at dialysis.  She states that she did not take any of them today.  She undergoes dialysis on M, W, F.  At baseline, she wears 2 L of supplemental oxygen throughout the day.  She states that she has had decreased p.o. intake due to poor appetite over the past several weeks.  On Friday, she became short of breath during dialysis and session was cut short.  Today, she again became short of breath and had a drop in her blood pressure.  She also states that she had some mild chest pain at the time.  This has since resolved.  She received approximately 1 hour of treatment today prior to it being cut short due to hypotension.  Currently, she endorses associated generalized weakness.     Home Medications Prior to Admission medications   Medication Sig Start Date End Date Taking? Authorizing Provider  acetaminophen (TYLENOL) 500 MG tablet Take 1,000 mg by mouth daily as needed (pain).    [provider]  albuterol (PROVENTIL) (2.5 MG/3ML) 0.083% nebulizer solution Take 3 mLs (2.5 mg total) by nebulization every 6 (six) hours as needed for wheezing or shortness of breath. 07/10/21   Hunsucker, Lesia Sago, MD  allopurinol (ZYLOPRIM) 100 MG tablet Take 100 mg by mouth every evening.    [provider]  Artificial Tear  Solution (TEARS NATURALE OP) Place 1 drop into both eyes 4 (four) times daily as needed (dry eye).    [provider]  aspirin 81 MG chewable tablet Chew 1 tablet (81 mg total) by mouth every other day. 01/07/23   Monna Fam, MD  busPIRone (BUSPAR) 5 MG tablet Take 5 mg by mouth 3 (three) times daily. 01/01/23 01/01/24  [provider]  carvedilol (COREG) 25 MG tablet Take 1 tablet (25 mg total) by mouth 2 (two) times daily with a meal. 12/25/22   Janetta Hora, PA-C  cetirizine (ZYRTEC) 10 MG tablet Take 10 mg by mouth daily as needed for allergies.    [provider]  Cholecalciferol (VITAMIN D) 50 MCG (2000 UT) tablet Take 2,000 Units by mouth daily.    [provider]  cinacalcet (SENSIPAR) 30 MG tablet Take 60 mg by mouth every Monday, Wednesday, and Friday with hemodialysis. Pt states she takes 2 tab on Mon, 3 tab on Wed, and 2 tab on Fri.    [provider]  cloNIDine (CATAPRES - DOSED IN MG/24 HR) 0.3 mg/24hr patch Place 0.3 mg onto the skin every Sunday.    [provider]  diclofenac Sodium (VOLTAREN) 1 % GEL Apply 2 g topically daily as needed (pain).    [provider]  docusate sodium (COLACE) 100 MG capsule Take 100 mg by mouth daily as needed for moderate constipation.    [provider]  EPINEPHrine 0.3 mg/0.3 mL IJ  SOAJ injection Inject into the muscle as needed for anaphylaxis. Patient not taking: Reported on 03/28/2023 07/12/21   [provider]  fluticasone (FLONASE) 50 MCG/ACT nasal spray Place 1 spray into both nostrils daily as needed for allergies or rhinitis.    [provider]  hydrocortisone 2.5%-nystatin-zinc oxide 20% 1:1:1 ointment mixture Apply topically daily as needed. Patient not taking: Reported on 03/28/2023 01/07/23   Monna Fam, MD  isosorbide mononitrate (IMDUR) 60 MG 24 hr tablet Take 1 tablet (60 mg total) by mouth daily. 10/27/22   Jake Bathe, MD  LORazepam  (ATIVAN) 0.5 MG tablet Take 0.5 mg by mouth 3 (three) times daily.    [provider]  montelukast (SINGULAIR) 10 MG tablet Take 10 mg by mouth daily with supper.     [provider]  multivitamin (RENA-VIT) TABS tablet Take 1 tablet by mouth daily.    [provider]  NIFEdipine (PROCARDIA XL/NIFEDICAL XL) 60 MG 24 hr tablet Take 60 mg by mouth 2 (two) times daily. 10/01/21   [provider]  olmesartan (BENICAR) 40 MG tablet Take 40 mg by mouth every evening. 05/06/19   [provider]  ondansetron (ZOFRAN) 4 MG tablet Take 4 mg by mouth 2 (two) times daily as needed for nausea or vomiting.    [provider]  OXYGEN Inhale 2-3 L into the lungs continuous.    [provider]  pantoprazole (PROTONIX) 40 MG tablet Take 40 mg by mouth 2 (two) times daily. 06/09/20   [provider]  predniSONE (DELTASONE) 5 MG tablet Take 1 tablet (5 mg total) by mouth daily with breakfast. 02/27/22   Marrianne Mood, MD  Probiotic Product (PROBIOTIC PO) Take 1 capsule by mouth daily.    [provider]  silver sulfADIAZINE (SILVADENE) 1 % cream Apply 1 Application topically daily as needed (to prevent wound).    [provider]  Tiotropium Bromide-Olodaterol (STIOLTO RESPIMAT) 2.5-2.5 MCG/ACT AERS Inhale 2 puffs once daily into the lungs. 11/28/21   Hunsucker, Lesia Sago, MD  VENTOLIN HFA 108 (90 Base) MCG/ACT inhaler Inhale 2 puffs into the lungs See admin instructions. Take 2 puffs every 4-6 hours as needed for wheezing and shortness of breath 04/08/22   Hunsucker, Lesia Sago, MD  zolpidem (AMBIEN) 5 MG tablet Take 5 mg by mouth at bedtime as needed for sleep. 02/04/21   [provider]      Allergies    3-methyl-2-benzothiazolinone hydrazone, Banana, Black walnut flavoring agent (non-screening), Hazelnut (filbert), Leflunomide and related, Lisinopril, No healthtouch food allergies, Other, Pecan nut (diagnostic),  Trazodone and nefazodone, Adalimumab, Escitalopram oxalate, Ezetimibe, Secukinumab, Statins, Ferrlecit [na ferric gluc cplx in sucrose], Gabapentin, and Nsaids    Review of Systems   Review of Systems  Constitutional:  Positive for appetite change and fatigue.  Respiratory:  Positive for shortness of breath.   Cardiovascular:  Positive for chest pain.  Neurological:  Positive for weakness.  All other systems reviewed and are negative.   Physical Exam Updated Vital Signs BP 112/63   Pulse 67   Temp (!) 97.5 F (36.4 C)   Resp 20   SpO2 100%  Physical Exam Vitals and nursing note reviewed.  Constitutional:      General: She is not in acute distress.    Appearance: Normal appearance. She is well-developed. She is ill-appearing (Chronically). She is not toxic-appearing or diaphoretic.  HENT:     Head: Normocephalic and atraumatic.     Right Ear: External  ear normal.     Left Ear: External ear normal.     Nose: Nose normal.     Mouth/Throat:     Mouth: Mucous membranes are moist.  Eyes:     Extraocular Movements: Extraocular movements intact.     Conjunctiva/sclera: Conjunctivae normal.  Cardiovascular:     Rate and Rhythm: Normal rate and regular rhythm.     Heart sounds: No murmur heard. Pulmonary:     Effort: Pulmonary effort is normal. No respiratory distress.     Breath sounds: Normal breath sounds. No wheezing or rales.  Chest:     Chest wall: No tenderness.  Abdominal:     General: There is no distension.     Palpations: Abdomen is soft.     Tenderness: There is no abdominal tenderness.  Musculoskeletal:        General: No swelling or deformity.     Cervical back: Normal range of motion.  Skin:    General: Skin is warm and dry.     Coloration: Skin is not jaundiced or pale.  Neurological:     General: No focal deficit present.     Mental Status: She is alert and oriented to person, place, and time.  Psychiatric:        Mood and Affect: Mood normal.         Behavior: Behavior normal.     ED Results / Procedures / Treatments   Labs (all labs ordered are listed, but only abnormal results are displayed) Labs Reviewed  CBC - Abnormal; Notable for the following components:      Result Value   WBC 13.8 (*)    MCHC 29.4 (*)    RDW 19.7 (*)    Platelets 62 (*)    All other components within normal limits  PROTIME-INR - Abnormal; Notable for the following components:   Prothrombin Time 19.8 (*)    INR 1.7 (*)    All other components within normal limits  HEPATIC FUNCTION PANEL - Abnormal; Notable for the following components:   Total Protein 5.3 (*)    Albumin 2.3 (*)    All other components within normal limits  BASIC METABOLIC PANEL - Abnormal; Notable for the following components:   Potassium 3.2 (*)    Glucose, Bld 68 (*)    Creatinine, Ser 6.51 (*)    GFR, Estimated 7 (*)    All other components within normal limits  I-STAT CHEM 8, ED - Abnormal; Notable for the following components:   Potassium 3.2 (*)    Creatinine, Ser 7.30 (*)    Glucose, Bld 64 (*)    All other components within normal limits  TROPONIN I (HIGH SENSITIVITY) - Abnormal; Notable for the following components:   Troponin I (High Sensitivity) 182 (*)    All other components within normal limits  TROPONIN I (HIGH SENSITIVITY) - Abnormal; Notable for the following components:   Troponin I (High Sensitivity) 151 (*)    All other components within normal limits  CULTURE, BLOOD (ROUTINE X 2)  CULTURE, BLOOD (ROUTINE X 2)  CBG MONITORING, ED  I-STAT CG4 LACTIC ACID, ED  I-STAT CG4 LACTIC ACID, ED  CBG MONITORING, ED    EKG EKG Interpretation Date/Time:  Monday April 20 2023 08:15:33 EDT Ventricular Rate:  72 PR Interval:  248 QRS Duration:  108 QT Interval:  394 QTC Calculation: 431 R Axis:   113  Text Interpretation: Sinus rhythm with 1st degree A-V block Possible Left atrial enlargement  Right axis deviation ST & T wave abnormality, consider inferolateral  ischemia Abnormal ECG Confirmed by Gloris Manchester [phone]) on 04/20/2023 10:44:15 AM  Radiology DG Chest Port 1 View Result Date: 04/20/2023 CLINICAL DATA:  Questionable sepsis - evaluate for abnormality EXAM: PORTABLE CHEST 1 VIEW COMPARISON:  03/28/2023. FINDINGS: Low lung volume. There are atelectatic changes/scarring at the left lung base. Bilateral lung fields are otherwise clear. Bilateral costophrenic angles are clear. Note is made of elevated right hemidiaphragm. Stable mildly enlarged cardio-mediastinal silhouette. There are surgical staples along the heart border and sternotomy wires, status post CABG (coronary artery bypass graft). Prosthetic aortic valve seen. No acute osseous abnormalities. Lower cervical spinal fixation hardware partially seen. The soft tissues are within normal limits. IMPRESSION: No active disease. Electronically Signed   By: Jules Schick M.D.   On: 04/20/2023 10:21    Procedures Procedures    Medications Ordered in ED Medications - No data to display  ED Course/ Medical Decision Making/ A&P                                 Medical Decision Making Amount and/or Complexity of Data Reviewed Labs: ordered. Radiology: ordered.   This patient presents to the ED for concern of hypotension, this involves an extensive number of treatment options, and is a complaint that carries with it a high risk of complications and morbidity.  The differential diagnosis includes polypharmacy, sepsis, hypovolemia, anemia, valvular dysfunction   Co morbidities that complicate the patient evaluation  ESRD, HTN, CAD, atrial fibrillation, DM, COPD, CHF, GI bleeding, aortic stenosis   Additional history obtained:  Additional history obtained from N/A External records from outside source obtained and reviewed including EMR   Lab Tests:  I Ordered, and personally interpreted labs.  The pertinent results include: Elevated creatinine consistent with ESRD, mild hypokalemia,  leukocytosis, chronic thrombocytopenia, baseline elevation in troponin   Imaging Studies ordered:  I ordered imaging studies including chest x-ray, CT of chest, abdomen, pelvis I independently visualized and interpreted imaging which showed no acute findings and x-ray.  CT scan pending at time of signout. I agree with the radiologist interpretation   Cardiac Monitoring: / EKG:  The patient was maintained on a cardiac monitor.  I personally viewed and interpreted the cardiac monitored which showed an underlying rhythm of: Sinus rhythm  Problem List / ED Course / Critical interventions / Medication management  Patient presents for hypotension, shortness of breath, chest pain.  Onset was during dialysis session today.  Dialysis session was cut short.  Patient denies taking any of her home blood pressure medications today.  Blood pressure on arrival in the ED was 76/54.  Upon patient being bedded in the ED, blood pressure mildly improved with systolic in the 90s.  Patient is alert and oriented at this time.  She endorses generalized weakness but denies any other current symptoms.  Workup was initiated.  Lab work notable for leukocytosis.  Glucose was low-normal.  Patient was given juice.  She had continued improvement in her blood pressure while in the ED. CT was pending at time of signout.  Care of patient was signed out to oncoming ED provider.   Social Determinants of Health:  Frequent hospitalizations        Final Clinical Impression(s) / ED Diagnoses Final diagnoses:  Hypotension, unspecified hypotension type    Rx / DC Orders ED Discharge Orders     None  Gloris Manchester, MD 04/20/23 [phone]

## 2023-04-20 NOTE — ED Triage Notes (Signed)
 Pt arrives via EMS from her dialysis center. Pt made it through 1 hour of her treatment then her blood pressure dropped. Pt states this has been happening very frequent lately. Pt arrives AxOx4. Pt c/o generalized weakness and feeling dizzy. BP during triage is 76/54

## 2023-04-20 NOTE — Assessment & Plan Note (Signed)
 hold asa  due to thrombocytopenia  and 2 d echo ordered / pending. Chronically elevated TNI due to ESRD on HD.

## 2023-04-20 NOTE — Assessment & Plan Note (Signed)
 2/2 to volume overload and will continue with supplemental oxygen. Low threshold for NIPPV. 2 d echo to assess any changes in her EF.

## 2023-04-20 NOTE — ED Notes (Signed)
 Provider notified for trop 182

## 2023-04-20 NOTE — Assessment & Plan Note (Signed)
 Patient is on a Monday Wednesday Friday schedule, nephrology consulted.  Pressure meds held.d/w nephrology that pt may need HD overnight.

## 2023-04-20 NOTE — Assessment & Plan Note (Signed)
 Platelet - 62000,2/2 to cirrhosis monitor count and scd's for dvt.

## 2023-04-20 NOTE — H&P (Signed)
 History and Physical    Patient: Jodi Bell:[id number] DOB: [birth year] DOA: 04/20/2023 DOS: the patient was seen and examined on 04/20/2023 PCP: Genelle Gather, MD  Patient coming from:  HD center Chief complaint: Chief Complaint  Patient presents with   Hypotension   Weakness   HPI:  Jodi Bell is a 63 y.o. female with past medical history  of  history of CAD status post CABG x 2 vessel, ESRD on dialysis MWF schedule, chronic hypoxic respiratory failure 2 L oxygen baseline, diet controlled DM type II, essential hypertension, chronic thrombocytopenia, aortic stenosis status post TAVR 12/2022, afib on asa 81, left bundle branch block, generalized anxiety disorder, history of recurrent GI bleed, anemia of chronic disease coming for generalized weakness and hypotension during her HD session and not able to finish her HD session patient only made it through for 1 hour and her blood pressure was noted to drop to 76/54. No reports of syncope passing out dizziness falls or trauma.  No reports of headaches blurred vision speech or gait issues no chest pain palpitation.  Patient was admitted on 15 February similar presentation of fall and weakness. Of note patient has allergies to multiple medications and foods. Per report patient's had her teeth extracted on Thursday because of that she has had poor intake.  >>ED Course: Pt in ed is alert awake oriented afebrile with O2 sats of 100% on room air. >>Vital signs in the ED were notable for the following:  Vitals:   04/20/23 1645 04/20/23 1646 04/20/23 1700 04/20/23 1834  BP: (!) 107/59  113/67 120/61  Pulse: 66  70 68  Temp:  98.4 F (36.9 C)    Resp: 19  (!) 26 (!) 21  SpO2: 100%  100% 100%  TempSrc:  Oral    >>Labs were notable for the following: Metabolic panel showing potassium of 3.2 glucose 68 creatinine of 6.51, LFTs, initial troponin of 180.2 repeat of 151. CBC shows wbc of 13.8 and hb of 13.1 and RDW of 19.7. Lactic  1.6.  >>EKG: Independently reviewed: SR  72 with first-degree heart block PR of 248, right axis deviation, ST-T wave depression in leads II, III, aVF and lateral leads.EKG is different than her previous EKG and February 2025 as far as the ST changes  >>Imaging and additional notable ED work-up:  Chest x-ray today is negative for any acute process.  >>While in the ED patient received the following: Medications  busPIRone (BUSPAR) tablet 5 mg (5 mg Oral Given 04/20/23 1752)  albuterol (PROVENTIL) (2.5 MG/3ML) 0.083% nebulizer solution 3 mL (has no administration in time range)  arformoterol (BROVANA) nebulizer solution 15 mcg (has no administration in time range)    And  umeclidinium bromide (INCRUSE ELLIPTA) 62.5 MCG/ACT 1 puff (has no administration in time range)  sodium chloride flush (NS) 0.9 % injection 3 mL (has no administration in time range)  acetaminophen (TYLENOL) tablet 650 mg (has no administration in time range)    Or  acetaminophen (TYLENOL) suppository 650 mg (has no administration in time range)  pantoprazole (PROTONIX) injection 40 mg (has no administration in time range)  cinacalcet (SENSIPAR) tablet 60 mg (has no administration in time range)     Review of Systems  Respiratory:  Positive for shortness of breath.   Neurological:  Positive for dizziness and weakness.   Past Medical History:  Diagnosis Date   Anemia of chronic disease    Anxiety    Arthritis    oa  and psoriatic ra   Asthma    Back pain, chronic    lower back   Chronic insomnia    COPD (chronic obstructive pulmonary disease) (HCC)    Diabetes mellitus    type 2   ESRD on dialysis [facility])    Essential hypertension    GERD (gastroesophageal reflux disease)    Gout    History of hiatal hernia    Hypoalbuminemia    Hyponatremia    Mild aortic stenosis    Mitral regurgitation    Mitral stenosis    Obesity    PAD (peripheral artery disease) (HCC)    a. externial iliac calcification seen on  CT 04/2020.   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 Lynnette Caffey & Laneta Simmers.   Sleep apnea    Tobacco abuse    Upper GI bleed 05/2019   Past Surgical History:  Procedure Laterality Date   BACK SURGERY  2016   lower back fusion with cage   BIOPSY  09/23/2017   Procedure: BIOPSY;  Surgeon: Graylin Shiver, MD;  Location: [facility] ENDOSCOPY;  Service: Endoscopy;;   BIOPSY  05/19/2019   Procedure: BIOPSY;  Surgeon: Kathi Der, MD;  Location: [facility]OS[facility];  Service: Gastroenterology;;   CESAREAN SECTION  1990   x 1    COLONOSCOPY WITH PROPOFOL N/A 09/23/2017   Procedure: COLONOSCOPY WITH PROPOFOL Hemostatic clips placed;  Surgeon: Graylin Shiver, MD;  Location: [facility] ENDOS[facility];  Service: Endoscopy;  Laterality: N/A;   COLONOSCOPY WITH PROPOFOL N/A 10/02/2020   Procedure: COLONOSCOPY WITH PROPOFOL;  Surgeon: Willis Modena, MD;  Location: [facility] ENDOSCOPY;  Service: Endoscopy;  Laterality: N/A;   CORONARY ARTERY BYPASS GRAFT N/A 09/13/2020   Procedure: CORONARY ARTERY BYPASS GRAFTING (CABG), ON PUMP, TIMES TWO, USING LEFT INTERNAL MAMMARY ARTERY AND RIGHT ENDOSCOPICALLY HARVESTED GREATER SAPHENOUS VEIN;  Surgeon: Alleen Borne, MD;  Location: [facility] OR;  Service: Open Heart Surgery;  Laterality: N/A;   DILATION AND CURETTAGE OF UTERUS  1988   ENTEROSCOPY N/A 07/22/2021   Procedure: ENTEROSCOPY;  Surgeon: Kerin Salen, MD;  Location: [facility] ENDOSCOPY;  Service: Gastroenterology;  Laterality: N/A;   ENTEROSCOPY N/A 01/28/2023   Procedure: ENTEROSCOPY;  Surgeon: [name], MD;  Location: [facility] ENDOSCOPY;  Service: Gastroenterology;  Laterality: N/A;   ESOPHAGOGASTRODUODENOSCOPY N/A 09/22/2020   Procedure: ESOPHAGOGASTRODUODENOSCOPY (EGD);  Surgeon: Willis Modena, MD;  Location: [facility] ENDOSCOPY;  Service: Endoscopy;  Laterality: N/A;   ESOPHAGOGASTRODUODENOSCOPY (EGD) WITH PROPOFOL N/A 09/23/2017   Procedure: ESOPHAGOGASTRODUODENOSCOPY (EGD)  WITH PROPOFOL;  Surgeon: Graylin Shiver, MD;  Location: [facility]OS[facility];  Service: Endoscopy;  Laterality: N/A;   ESOPHAGOGASTRODUODENOSCOPY (EGD) WITH PROPOFOL N/A 05/19/2019   Procedure: ESOPHAGOGASTRODUODENOSCOPY (EGD) WITH PROPOFOL;  Surgeon: Kathi Der, MD;  Location: [facility]OS[facility];  Service: Gastroenterology;  Laterality: N/A;   ESOPHAGOGASTRODUODENOSCOPY (EGD) WITH PROPOFOL N/A 10/02/2020   Procedure: ESOPHAGOGASTRODUODENOSCOPY (EGD) WITH PROPOFOL;  Surgeon: Willis Modena, MD;  Location: [facility] ENDOSCOPY;  Service: Endoscopy;  Laterality: N/A;   GIVENS CAPSULE STUDY N/A 05/19/2019   Procedure: GIVENS CAPSULE STUDY;  Surgeon: Kathi Der, MD;  Location: [facility]OS[facility];  Service: Gastroenterology;  Laterality: N/A;   GIVENS CAPSULE STUDY N/A 07/19/2021   Procedure: GIVENS CAPSULE STUDY;  Surgeon: [name], MD;  Location: [facility] ENDOSCOPY;  Service: Gastroenterology;  Laterality: N/A;   HEMOSTASIS CLIP PLACEMENT  09/22/2020   Procedure: HEMOSTASIS CLIP PLACEMENT;  Surgeon: Willis Modena, MD;  Location: [facility]OS[facility];  Service:  Endoscopy;;   HEMOSTASIS CONTROL  09/22/2020   Procedure: HEMOSTASIS CONTROL;  Surgeon: Willis Modena, MD;  Location: [facility] ENDOSCOPY;  Service: Endoscopy;;   HOT HEMOSTASIS N/A 05/19/2019   Procedure: HOT HEMOSTASIS (ARGON PLASMA COAGULATION/BICAP);  Surgeon: Kathi Der, MD;  Location: [facility] ENDOSCOPY;  Service: Gastroenterology;  Laterality: N/A;   HOT HEMOSTASIS N/A 01/28/2023   Procedure: HOT HEMOSTASIS (ARGON PLASMA COAGULATION/BICAP);  Surgeon: [name], MD;  Location: [facility] ENDOSCOPY;  Service: Gastroenterology;  Laterality: N/A;   INTRAOPERATIVE TRANSTHORACIC ECHOCARDIOGRAM N/A 12/23/2022   Procedure: INTRAOPERATIVE TRANSTHORACIC ECHOCARDIOGRAM;  Surgeon: [name], MD;  [facility] INVASIVE CV LAB;  Service: Cardiovascular;  Laterality: N/A;   LEFT HEART CATH AND CORONARY ANGIOGRAPHY N/A 09/10/2020   Procedure: LEFT HEART CATH AND CORONARY ANGIOGRAPHY;   Surgeon: Yvonne Kendall, MD;  [facility]: [facility] INVAS[facility] CV LAB;  Service: Cardiovascular;  Laterality: N/A;   PARTIAL KNEE ARTHROPLASTY Left 11/12/2017   Procedure: left unicompartmental arthroplasty-medial;  Surgeon: Durene Romans, MD;  Location: [facility] ORS;  Service: Orthopedics;  Laterality: Left;    RIGHT HEART CATH AND CORONARY/GRAFT ANGIOGRAPHY N/A 06/26/2022   Procedure: RIGHT HEART CATH AND CORONARY/GRAFT ANGIOGRAPHY;  Surgeon: [name], MD;  [facility]: [facility] CV LAB;  Service: Cardiovascular;  Laterality: N/A;   SUBMUCOSAL INJECTION  09/23/2017   Procedure: SUBMUCOSAL INJECTION;  Surgeon: Graylin Shiver, MD;  Location: [facility] ENDOSCOPY;  Service: Endoscopy;;  in colon   TEE WITHOUT CARDIOVERSION N/A 09/13/2020   Procedure: TRANSESOPHAGEAL ECHOCARDIOGRAM (TEE);  Surgeon: Alleen Borne, MD;  Location: [facility] OR;  Service: Open Heart Surgery;  Laterality: N/A;   TUBAL LIGATION  1990    reports that she quit smoking about 2 years ago. Her smoking use included cigarettes. She started smoking about 42 years ago. She has a 20 pack-year smoking history. She has never used smokeless tobacco. She reports current alcohol use. She reports that she does not use drugs.  Allergies  Allergen Reactions   3-Methyl-2-Benzothiazolinone Hydrazone Other (See Comments)    Muscle weakness muscle cramping in legs Other reaction(s): Myalgias (Muscle Pain)   Banana Anaphylaxis, Swelling and Other (See Comments)    TONGUE AND MOUTH SWELLING   Black Buyer, retail (Non-Screening) Anaphylaxis and Itching    Walnuts   Hazelnut (Filbert) Anaphylaxis    Hazelnuts   Leflunomide And Related Other (See Comments)    Severe Headache   Lisinopril Swelling    Angioedema  Swelling of the face   No Healtht[employer] Food Allergies Anaphylaxis    Spicy Mustard 4.7.2021 Pt reports that she eats Regular Yellow Mustard Swelling and itching of tongue   Other Anaphylaxis, Swelling and Other (See Comments)     Cosentyx= angioedema Mouth itches and tongue swelling Hazel nuts and pecans also Walnuts Renal failure Serotonin syndrome  Tremors Muscle weakness muscle cramping in legs Other reaction(s): Myalgias (Muscle Pain)   Pecan Nut (Diagnostic) Anaphylaxis and Itching    Pecans   Trazodone And Nefazodone Other (See Comments)    Serotonin Syndrome  Tremors   Adalimumab Other (See Comments)    Blisters on abdomen =humira   Escitalopram Oxalate Other (See Comments)    Hand problems - Serotonin Syndrome     Ezetimibe Other (See Comments)    myalgias cramps    Secukinumab Swelling    Cosentyx = angiodema    Statins Other (See Comments)    Leg pains and weakness   Ferrlecit [Na Ferric Gluc Cplx In Sucrose]     Not reaction given  from HD   Gabapentin Other (See Comments)    tremors   Nsaids     Avoid due to renal problems     Family History  Problem Relation Age of Onset   Lung cancer Mother    Diabetes Sister    Heart disease Sister    Asthma Daughter    Arthritis Daughter    Arthritis Daughter    Thyroid cancer Other        1/2 sister   Heart disease Other        1/2 sister    Prior to Admission medications   Medication Sig Start Date End Date Taking? Authorizing Provider  acetaminophen (TYLENOL) 500 MG tablet Take 1,000 mg by mouth daily as needed (pain).    [provider]  albuterol (PROVENTIL) (2.5 MG/3ML) 0.083% nebulizer solution Take 3 mLs (2.5 mg total) by nebulization every 6 (six) hours as needed for wheezing or shortness of breath. 07/10/21   Hunsucker, Lesia Sago, MD  allopurinol (ZYLOPRIM) 100 MG tablet Take 100 mg by mouth every evening.    [provider]  Artificial Tear Solution (TEARS NATURALE OP) Place 1 drop into both eyes 4 (four) times daily as needed (dry eye).    [provider]  aspirin 81 MG chewable tablet Chew 1 tablet (81 mg total) by mouth every other day. 01/07/23   Monna Fam, MD  busPIRone (BUSPAR) 5 MG tablet  Take 5 mg by mouth 3 (three) times daily. 01/01/23 01/01/24  [provider]  carvedilol (COREG) 25 MG tablet Take 1 tablet (25 mg total) by mouth 2 (two) times daily with a meal. 12/25/22   Janetta Hora, PA-C  cetirizine (ZYRTEC) 10 MG tablet Take 10 mg by mouth daily as needed for allergies.    [provider]  Cholecalciferol (VITAMIN D) 50 MCG (2000 UT) tablet Take 2,000 Units by mouth daily.    [provider]  cinacalcet (SENSIPAR) 30 MG tablet Take 60 mg by mouth every Monday, Wednesday, and Friday with hemodialysis. Pt states she takes 2 tab on Mon, 3 tab on Wed, and 2 tab on Fri.    [provider]  cloNIDine (CATAPRES - DOSED IN MG/24 HR) 0.3 mg/24hr patch Place 0.3 mg onto the skin every Sunday.    [provider]  diclofenac Sodium (VOLTAREN) 1 % GEL Apply 2 g topically daily as needed (pain).    [provider]  docusate sodium (COLACE) 100 MG capsule Take 100 mg by mouth daily as needed for moderate constipation.    [provider]  EPINEPHrine 0.3 mg/0.3 mL IJ SOAJ injection Inject into the muscle as needed for anaphylaxis. Patient not taking: Reported on 03/28/2023 07/12/21   [provider]  fluticasone (FLONASE) 50 MCG/ACT nasal spray Place 1 spray into both nostrils daily as needed for allergies or rhinitis.    [provider]  hydrocortisone 2.5%-nystatin-zinc oxide 20% 1:1:1 ointment mixture Apply topically daily as needed. Patient not taking: Reported on 03/28/2023 01/07/23   Monna Fam, MD  isosorbide mononitrate (IMDUR) 60 MG 24 hr tablet Take 1 tablet (60 mg total) by mouth daily. 10/27/22   Jake Bathe, MD  LORazepam (ATIVAN) 0.5 MG tablet Take 0.5 mg by mouth 3 (three) times daily.    [provider]  montelukast (SINGULAIR) 10 MG tablet Take 10 mg by mouth daily with supper.     [provider]  multivitamin (RENA-VIT) TABS tablet Take 1 tablet by mouth daily.  [provider]  NIFEdipine (PROCARDIA XL/NIFEDICAL XL) 60 MG 24 hr tablet Take 60 mg by mouth 2 (two) times daily. 10/01/21   [provider]  olmesartan (BENICAR) 40 MG tablet Take 40 mg by mouth every evening. 05/06/19   [provider]  ondansetron (ZOFRAN) 4 MG tablet Take 4 mg by mouth 2 (two) times daily as needed for nausea or vomiting.    [provider]  OXYGEN Inhale 2-3 L into the lungs continuous.    [provider]  pantoprazole (PROTONIX) 40 MG tablet Take 40 mg by mouth 2 (two) times daily. 06/09/20   [provider]  predniSONE (DELTASONE) 5 MG tablet Take 1 tablet (5 mg total) by mouth daily with breakfast. 02/27/22   Marrianne Mood, MD  Probiotic Product (PROBIOTIC PO) Take 1 capsule by mouth daily.    [provider]  silver sulfADIAZINE (SILVADENE) 1 % cream Apply 1 Application topically daily as needed (to prevent wound).    [provider]  Tiotropium Bromide-Olodaterol (STIOLTO RESPIMAT) 2.5-2.5 MCG/ACT AERS Inhale 2 puffs once daily into the lungs. 11/28/21   Hunsucker, Lesia Sago, MD  VENTOLIN HFA 108 (90 Base) MCG/ACT inhaler Inhale 2 puffs into the lungs See admin instructions. Take 2 puffs every 4-6 hours as needed for wheezing and shortness of breath 04/08/22   Hunsucker, Lesia Sago, MD  zolpidem (AMBIEN) 5 MG tablet Take 5 mg by mouth at bedtime as needed for sleep. 02/04/21   [provider]                                                                                   Vitals:   04/20/23 1645 04/20/23 1646 04/20/23 1700 04/20/23 1834  BP: (!) 107/59  113/67 120/61  Pulse: 66  70 68  Resp: 19  (!) 26 (!) 21  Temp:  98.4 F (36.9 C)    TempSrc:  Oral    SpO2: 100%  100% 100%   Physical Exam Vitals and nursing note reviewed.  Constitutional:      General: She is not in acute distress. HENT:     Head: Normocephalic and atraumatic.     Right Ear: Hearing normal.     Left Ear:  Hearing normal.     Nose: Nose normal. No nasal deformity.     Mouth/Throat:     Lips: Pink.     Tongue: No lesions.     Pharynx: Oropharynx is clear.  Eyes:     General: Lids are normal.     Extraocular Movements: Extraocular movements intact.  Cardiovascular:     Rate and Rhythm: Normal rate and regular rhythm.     Heart sounds: Murmur heard.  Pulmonary:     Effort: Pulmonary effort is normal. No tachypnea, bradypnea or respiratory distress.     Breath sounds: Examination of the right-middle field reveals rales. Examination of the left-middle field reveals rales. Examination of the right-lower field reveals rales. Examination of the left-lower field reveals rales. Rales present.  Abdominal:     General: Bowel sounds are normal. There is distension.     Palpations: Abdomen is soft. There is no hepatomegaly, splenomegaly or mass.  Tenderness: There is no abdominal tenderness.  Musculoskeletal:     Right lower leg: No edema.     Left lower leg: No edema.  Skin:    General: Skin is warm.  Neurological:     General: No focal deficit present.     Mental Status: She is alert and oriented to person, place, and time.     Cranial Nerves: Cranial nerves 2-12 are intact.  Psychiatric:        Attention and Perception: Attention normal.        Mood and Affect: Mood normal.        Speech: Speech normal.        Behavior: Behavior normal. Behavior is cooperative.    Labs on Admission: I have personally reviewed following labs and imaging studies.  CBC: Recent Labs  Lab 04/20/23 0827 04/20/23 1010  WBC 13.8*  --   HGB 13.1 15.0  HCT 44.6 44.0  MCV 99.3  --   PLT 62*  --    Basic Metabolic Panel: Recent Labs  Lab 04/20/23 0950 04/20/23 1010  NA 138 135  K 3.2* 3.2*  CL 101 100  CO2 27  --   GLUCOSE 68* 64*  BUN 13 14  CREATININE 6.51* 7.30*  CALCIUM 9.8  --    GFR: CrCl cannot be calculated (Unknown ideal weight.). Liver Function Tests: Recent Labs  Lab  04/20/23 0937  AST 19  ALT 9  ALKPHOS 81  BILITOT 0.8  PROT 5.3*  ALBUMIN 2.3*   No results for input(s): "LIPASE", "AMYLASE" in the last 168 hours. No results for input(s): "AMMONIA" in the last 168 hours. Coagulation Profile: Recent Labs  Lab 04/20/23 0937  INR 1.7*   Cardiac Enzymes: No results for input(s): "CKTOTAL", "CKMB", "CKMBINDEX", "TROPONINI" in the last 168 hours. BNP (last 3 results) No results for input(s): "PROBNP" in the last 8760 hours. HbA1C: No results for input(s): "HGBA1C" in the last 72 hours. CBG: Recent Labs  Lab 04/20/23 1459  GLUCAP 76   Lipid Profile: No results for input(s): "CHOL", "HDL", "LDLCALC", "TRIG", "CHOLHDL", "LDLDIRECT" in the last 72 hours. Thyroid Function Tests: No results for input(s): "TSH", "T4TOTAL", "FREET4", "T3FREE", "THYROIDAB" in the last 72 hours. Anemia Panel: No results for input(s): "VITAMINB12", "FOLATE", "FERRITIN", "TIBC", "IRON", "RETICCTPCT" in the last 72 hours. Urine analysis:    Component Value Date/Time   COLORURINE YELLOW 09/12/2020 2333   APPEARANCEUR HAZY (A) 09/12/2020 2333   LABSPEC 1.012 09/12/2020 2333   PHURINE 8.0 09/12/2020 2333   GLUCOSEU NEGATIVE 09/12/2020 2333   HGBUR SMALL (A) 09/12/2020 2333   BILIRUBINUR NEGATIVE 09/12/2020 2333   KETONESUR NEGATIVE 09/12/2020 2333   PROTEINUR 100 (A) 09/12/2020 2333   UROBILINOGEN 0.2 09/05/2014 0013   NITRITE NEGATIVE 09/12/2020 2333   LEUKOCYTESUR NEGATIVE 09/12/2020 2333   Radiological Exams on Admission: CT CHEST ABDOMEN PELVIS WO CONTRAST Result Date: 04/20/2023 CLINICAL DATA:  Sepsis. EXAM: CT CHEST, ABDOMEN AND PELVIS WITHOUT CONTRAST TECHNIQUE: Multidetector CT imaging of the chest, abdomen and pelvis was performed following the standard protocol without IV contrast. RADIATION DOSE REDUCTION: This exam was performed according to the departmental dose-optimization program which includes automated exposure control, adjustment of the mA and/or  kV according to patient size and/or use of iterative reconstruction technique. COMPARISON:  CT abdomen pelvis 03/31/2023, CT chest 07/24/2022. FINDINGS: CT CHEST FINDINGS Cardiovascular: Atherosclerotic calcification of the aorta. Aortic valve repair. Three-vessel coronary artery calcification. Enlarged pulmonic trunk and heart. No pericardial effusion. Mediastinum/Nodes: No pathologically  enlarged mediastinal or axillary lymph nodes. Hilar regions are difficult to definitively evaluate without IV contrast. Debris and air in a dilated esophagus. Lungs/Pleura: Very minimal patchy ground-glass in the right upper lobe, likely infectious/inflammatory in etiology. A few scattered pulmonary nodules measure 5 mm or less in size, unchanged. Small right pleural effusion and trace left pleural effusion with dependent atelectasis in the lower lobes. No pleural fluid. Airway is unremarkable. Musculoskeletal: Degenerative changes in the spine. Increased bone density is in keeping with renal osteodystrophy. CT ABDOMEN PELVIS FINDINGS Hepatobiliary: Probable small cyst in the inferior right hepatic lobe. No specific follow-up necessary. Gallbladder is minimally prominent and the wall appears slightly thickened, unchanged from 07/24/2022. No biliary ductal dilatation. Pancreas: Negative. Spleen: Negative. Adrenals/Urinary Tract: Adrenal glands are unremarkable. Numerous low-attenuation lesions in the kidneys. No specific follow-up necessary. Bilateral renal stones. Ureters are decompressed. Bladder is very low in volume. Stomach/Bowel: Similar chronic distal gastric wall thickening. Stomach, small bowel and appendix are otherwise grossly unremarkable. Slight thickening of the wall of the ascending colon, new from 03/31/2023. Colon is otherwise grossly unremarkable. Vascular/Lymphatic: Atherosclerotic calcification of the aorta. No pathologically enlarged lymph nodes. Reproductive: Uterus is visualized.  No adnexal mass. Other:  Small ascites.  Diffuse body wall edema. Musculoskeletal: Degenerative changes in the spine. Increased bone density, indicative of renal osteodystrophy. L3-4 posterior lumbar interbody fusion. Old L2 compression fracture. IMPRESSION: 1. Slight ascending colonic wall thickening, new from 03/31/2023. Difficult to exclude colitis. 2. Tiny right and trace left pleural effusions with minimal dependent atelectasis in both lower lobes. 3. Small ascites. 4. Debris and air in a dilated esophagus, indicative of dysmotility. 5. Small pulmonary nodules, stable from 07/24/2022. Recommend attention on follow-up. 6. Bilateral renal stones. 7. Aortic atherosclerosis (ICD10-I70.0). Three-vessel coronary artery calcification. 8. Enlarged pulmonic trunk, indicative of pulmonary arterial hypertension. Electronically Signed   By: Leanna Battles M.D.   On: 04/20/2023 16:30   DG Chest Port 1 View Result Date: 04/20/2023 CLINICAL DATA:  Questionable sepsis - evaluate for abnormality EXAM: PORTABLE CHEST 1 VIEW COMPARISON:  03/28/2023. FINDINGS: Low lung volume. There are atelectatic changes/scarring at the left lung base. Bilateral lung fields are otherwise clear. Bilateral costophrenic angles are clear. Note is made of elevated right hemidiaphragm. Stable mildly enlarged cardio-mediastinal silhouette. There are surgical staples along the heart border and sternotomy wires, status post CABG (coronary artery bypass graft). Prosthetic aortic valve seen. No acute osseous abnormalities. Lower cervical spinal fixation hardware partially seen. The soft tissues are within normal limits. IMPRESSION: No active disease. Electronically Signed   By: Jules Schick M.D.   On: 04/20/2023 10:21   Data Reviewed: Relevant notes from primary care and specialist visits, past discharge summaries as available in EHR, including Care Everywhere. Prior diagnostic testing as pertinent to current admission diagnoses, Updated medications and problem lists for  reconciliation ED course, including vitals, labs, imaging, treatment and response to treatment,Triage notes, nursing and pharmacy notes and ED provider's notes Notable results as noted in HPI.Discussed case with EDMD/ ED APP/ or Specialty MD on call and as needed.  Assessment & Plan Hypotension D/d include: Medication related-we will currently hold patient's Coreg 25 twice daily, Imdur 60, clonidine, nifedipine, Benicar. Cardiac related-with an abnormal 2D echocardiogram done in December 2024 we will repeat and defer a.m. team to consult cardiology . GI blood loss: Patient has had guaiac positive stools, currently anemia has resolved patient is receiving treatment with nephrology, will repeat stool guaiac and may need GI consult.  Type and  screen, IV PPI. Will add midodrine.  ESRD on dialysis [facility]) Patient is on a Monday Wednesday Friday schedule, nephrology consulted.  Pressure meds held.d/w nephrology that pt may need HD overnight.  Non-insulin dependent type 2 diabetes mellitus (HCC) Resolved. Renal Diet.  Essential hypertension Holding all her BP meds.  COPD (chronic obstructive pulmonary disease) (HCC) Stable, Prn albuterol.  Tobacco use Quit many years ago. Coronary artery disease involving coronary bypass graft of native heart with angina pectoris (HCC) hold asa  due to thrombocytopenia  and 2 d echo ordered / pending. Chronically elevated TNI due to ESRD on HD. Paroxysmal atrial fibrillation (HCC) SR with prolonged pr . No anticoagulation except for aspirin.  GI bleeding Pt S/P GI eval with - endoscopy showing Two very minimal bleeding angiodysplastic lesions in the stomach. Treated with argon plasma coagulation ( APC). Occult to identify chronic blood loss. Thrombocytopenia (HCC) Platelet - 62000,2/2 to cirrhosis monitor count and scd's for dvt.  Cirrhosis of liver (HCC) Pt is followed by GI, and will defer if consult needed. Unclear if this is related to NASH.  Acute on  chronic respiratory failure with hypoxia (HCC) 2/2 to volume overload and will continue with supplemental oxygen. Low threshold for NIPPV. 2 d echo to assess any changes in her EF.  >>Ct shows ? Colitis pt has no abd pain or BM changes.  We will follow and decide on abx regimen if pt develops fever, white count abd pain diarrhea .  DVT prophylaxis:  Scd's  Consults:  Nephrology.  Advance Care Planning:    Code Status: Full Code   Family Communication:  None.  Disposition Plan:  Home  Severity of Illness: The appropriate patient status for this patient is INPATIENT. Inpatient status is judged to be reasonable and necessary in order to provide the required intensity of service to ensure the patient's safety. The patient's presenting symptoms, physical exam findings, and initial radiographic and laboratory data in the context of their chronic comorbidities is felt to place them at high risk for further clinical deterioration. Furthermore, it is not anticipated that the patient will be medically stable for discharge from the hospital within 2 midnights of admission.   * I certify that at the point of admission it is my clinical judgment that the patient will require inpatient hospital care spanning beyond 2 midnights from the point of admission due to high intensity of service, high risk for further deterioration and high frequency of surveillance required.*  Author: Gertha Calkin, MD 04/20/2023 7:50 PM  For on call review [email or web address].   Unresulted Labs (From admission, onward)     Start     Ordered   04/21/23 0500  Comprehensive metabolic panel  Tomorrow morning,   R        04/20/23 1934   04/21/23 0500  CBC  Tomorrow morning,   R        04/20/23 1934   04/20/23 1951  Type and screen  Once,   R        04/20/23 1950   04/20/23 0909  Blood Culture (routine x 2)  (Undifferentiated presentation (screening labs and basic nursing orders))  BLOOD CULTURE X 2,   STAT      04/20/23 0909    Unscheduled  Occult blood card to lab, stool RN will collect  As needed,   R     Question:  Specimen to be collected by:  Answer:  RN will collect   04/20/23 1844  Orders Placed This Encounter  Procedures   Blood Culture (routine x 2)   DG Chest Port 1 View   CT CHEST ABDOMEN PELVIS WO CONTRAST   CBC   Protime-INR   Hepatic function panel   Basic metabolic panel   Occult blood card to lab, stool RN will collect   Comprehensive metabolic panel   CBC   Diet NPO time specified Except for: Sips with Meds, Ice Chips   Document Height and Actual Weight   ED Cardiac monitoring   Maintain IV access   Vital signs   Notify physician (specify)   Mobility Protocol: No Restrictions RN to initiate protocols based on patient's level of care   Refer to Sidebar Report Refer to ICU, Med-Surg, Progressive, and Step-Down Mobility Protocol Sidebars   Initiate Adult Central Line Maintenance and Catheter Protocol for patients with central line (CVC, PICC, Port, Hemodialysis, Trialysis)   Daily weights   Intake and Output   Do not place and if present remove PureWick   Initiate Oral Care Protocol   Initiate Carrier Fluid Protocol   RN may order General Admission PRN Orders utilizing "General Admission PRN medications" (through manage orders) for the following patient needs: allergy symptoms (Claritin), cold sores (Carmex), cough (Robitussin DM), eye irritation (Liquifilm Tears), hemorrhoids (Tucks), indigestion (Maalox), minor skin irritation (Hydrocortisone Cream), muscle pain Romeo Apple Gay), nose irritation (saline nasal spray) and sore throat (Chloraseptic spray).   SCDs   Cardiac Monitoring - Continuous Indefinite   Full code   Consult for Unassigned Medical Admission   Consult to hospitalist   Consult to nephrology   Pulse oximetry check with vital signs   Oxygen therapy Mode or (Route): Nasal cannula; Liters Per Minute: 2; Keep O2 saturation between: greater than 92 %   CBG  monitoring, ED   I-Stat Lactic Acid, ED   I-Stat Chem 8, ED   POC CBG, ED   ED EKG   ECHOCARDIOGRAM COMPLETE BUBBLE STUDY   Type and screen   Place in observation (patient's expected length of stay will be less than 2 midnights)   Aspiration precautions   Fall precautions

## 2023-04-20 NOTE — Assessment & Plan Note (Addendum)
 Pt S/P GI eval with - endoscopy showing Two very minimal bleeding angiodysplastic lesions in the stomach. Treated with argon plasma coagulation ( APC). Occult to identify chronic blood loss.

## 2023-04-20 NOTE — Assessment & Plan Note (Signed)
 Resolved. Renal Diet.

## 2023-04-20 NOTE — Assessment & Plan Note (Signed)
 Quit many years ago

## 2023-04-20 NOTE — Hospital Course (Signed)
  SpO2: 100 % O2 Flow Rate (L/min): 2 L/min

## 2023-04-20 NOTE — Assessment & Plan Note (Signed)
 D/d include: Medication related-we will currently hold patient's Coreg 25 twice daily, Imdur 60, clonidine, nifedipine, Benicar. Cardiac related-with an abnormal 2D echocardiogram done in December 2024 we will repeat and defer a.m. team to consult cardiology . GI blood loss: Patient has had guaiac positive stools, currently anemia has resolved patient is receiving treatment with nephrology, will repeat stool guaiac and may need GI consult.  Type and screen, IV PPI. Will add midodrine.

## 2023-04-20 NOTE — ED Provider Notes (Signed)
 Dialysis pt with hypotension and SOB/chest pain at dialysis today.  Similar event on friday.  Trops chronically elevated today.  Borderline blood sugar today.  Still feels weak but chest pain and SOB improved.  Leukocytosis today of 13.  CXR wnl.  CT CAP pending.  5:13 PM I have independently visualized and interpreted pt's images today.  CT chest abdomen pelvis today shows no sign of lung consolidation despite patient's complaint of dry cough.  Radiology reports slight ascending colonic wall thickening, small pleural effusions which could be the result of her cough and esophageal dysmotility but no other acute causes.  Speaking with the patient she also reports having her teeth extracted on Thursday and since that time she has had very poor oral intake.  She has been only taking her blood pressure medication when her blood pressure is over 130/80 and her last dose of blood pressure medication was yesterday evening.  She reports she has had still ongoing poor intake and given she has now been unable to complete dialysis she may need gentle hydration and medication adjustments and a full course of dialysis in the hospital.  Patient also complaining of feeling weak.  Discussed the findings with the patient.  Will admit to hospitalist medicine.    Gwyneth Sprout, MD 04/20/23 [phone]

## 2023-04-20 NOTE — Assessment & Plan Note (Signed)
 Holding all her BP meds.

## 2023-04-21 ENCOUNTER — Observation Stay (HOSPITAL_BASED_OUTPATIENT_CLINIC_OR_DEPARTMENT_OTHER)

## 2023-04-21 DIAGNOSIS — J9601 Acute respiratory failure with hypoxia: Secondary | ICD-10-CM

## 2023-04-21 DIAGNOSIS — I959 Hypotension, unspecified: Secondary | ICD-10-CM | POA: Diagnosis not present

## 2023-04-21 LAB — TYPE AND SCREEN
ABO/RH(D): AB POS
Antibody Screen: NEGATIVE

## 2023-04-21 LAB — CBC
HCT: 32.7 % — ABNORMAL LOW (ref 36.0–46.0)
Hemoglobin: 10 g/dL — ABNORMAL LOW (ref 12.0–15.0)
MCH: 30 pg (ref 26.0–34.0)
MCHC: 30.6 g/dL (ref 30.0–36.0)
MCV: 98.2 fL (ref 80.0–100.0)
Platelets: 46 10*3/uL — ABNORMAL LOW (ref 150–400)
RBC: 3.33 MIL/uL — ABNORMAL LOW (ref 3.87–5.11)
RDW: 19.9 % — ABNORMAL HIGH (ref 11.5–15.5)
WBC: 27.6 10*3/uL — ABNORMAL HIGH (ref 4.0–10.5)
nRBC: 0 % (ref 0.0–0.2)

## 2023-04-21 LAB — ECHOCARDIOGRAM COMPLETE BUBBLE STUDY
AR max vel: 1.44 cm2
AV Area VTI: 1.48 cm2
AV Area mean vel: 1.37 cm2
AV Mean grad: 36 mmHg
AV Peak grad: 62.4 mmHg
Ao pk vel: 3.95 m/s
Area-P 1/2: 3.48 cm2
MV VTI: 2.75 cm2
S' Lateral: 2.4 cm

## 2023-04-21 LAB — PHOSPHORUS: Phosphorus: 3.1 mg/dL (ref 2.5–4.6)

## 2023-04-21 LAB — COMPREHENSIVE METABOLIC PANEL
ALT: 8 U/L (ref 0–44)
AST: 15 U/L (ref 15–41)
Albumin: 2 g/dL — ABNORMAL LOW (ref 3.5–5.0)
Alkaline Phosphatase: 70 U/L (ref 38–126)
Anion gap: 3 — ABNORMAL LOW (ref 5–15)
BUN: 15 mg/dL (ref 8–23)
CO2: 24 mmol/L (ref 22–32)
Calcium: 8.2 mg/dL — ABNORMAL LOW (ref 8.9–10.3)
Chloride: 104 mmol/L (ref 98–111)
Creatinine, Ser: 7.13 mg/dL — ABNORMAL HIGH (ref 0.44–1.00)
GFR, Estimated: 6 mL/min — ABNORMAL LOW (ref >60)
Glucose, Bld: 80 mg/dL (ref 70–99)
Potassium: 3.2 mmol/L — ABNORMAL LOW (ref 3.5–5.1)
Sodium: 131 mmol/L — ABNORMAL LOW (ref 135–145)
Total Bilirubin: 0.4 mg/dL (ref 0.0–1.2)
Total Protein: 4.2 g/dL — ABNORMAL LOW (ref 6.5–8.1)

## 2023-04-21 LAB — PROCALCITONIN: Procalcitonin: 2.65 ng/mL

## 2023-04-21 MED ORDER — PANTOPRAZOLE SODIUM 40 MG PO TBEC
40.0000 mg | DELAYED_RELEASE_TABLET | Freq: Two times a day (BID) | ORAL | Status: DC
Start: 2023-04-21 — End: 2023-04-22
  Administered 2023-04-21 – 2023-04-22 (×4): 40 mg via ORAL
  Filled 2023-04-21 (×4): qty 1

## 2023-04-21 MED ORDER — ZOLPIDEM TARTRATE 5 MG PO TABS
5.0000 mg | ORAL_TABLET | Freq: Every evening | ORAL | Status: DC | PRN
  Administered 2023-04-21: 5 mg via ORAL
  Filled 2023-04-21 (×2): qty 1

## 2023-04-21 MED ORDER — GUAIFENESIN 100 MG/5ML PO LIQD
5.0000 mL | ORAL | Status: DC | PRN
  Administered 2023-04-21 – 2023-04-22 (×3): 5 mL via ORAL
  Filled 2023-04-21 (×3): qty 10

## 2023-04-21 MED ORDER — POLYVINYL ALCOHOL 1.4 % OP SOLN
1.0000 [drp] | OPHTHALMIC | Status: DC | PRN
Start: 2023-04-21 — End: 2023-04-25
  Administered 2023-04-21 – 2023-04-24 (×3): 1 [drp] via OPHTHALMIC
  Filled 2023-04-21 (×2): qty 15

## 2023-04-21 NOTE — Progress Notes (Signed)
 Nephrology Follow-Up Consult note   Assessment/Recommendations: Jodi Bell is a/an 63 y.o. female with a past medical history significant for ESRD, admitted for Hypertension.       ESRD: Monday been sick Friday dialysis a try at [facility].  Received short treatment on Monday.  Limited by hypotension.  Plan for dialysis tomorrow per schedule.  Volume Status: Seems acceptable.  Manage with ultrafiltration on dialysis  Hypotension: History of being on multiple blood pressure medications.  Possibly overtreated.  Hold blood pressure medications for now.  Chronic hypoxic arrest or failure: Continue home oxygen supplementation  Hypokalemia: Can replace orally when necessary.    Secondary hyperparathyroidism: On Sensipar reportedly.  Hold his calcium is somewhat low although corrects near normal.  NSTEMI:Management and workup per primary team  Leukocytosis: Management workup per primary team  Anemia: Hemoglobin 10.  ESA if needed  Liver cirrhosis: Chronic issue also with some thrombocytopenia.  Management per primary team   Recommendations conveyed to primary service.    Darnell Level [facility] 04/21/2023 2:10 PM  ___________________________________________________________  CC: Low BP  Interval History/Subjective: Blood pressure fairly stable overnight.  Possibly related to overuse of blood pressure medications in the outpatient setting.  Feels okay today   Medications:  Current Facility-Administered Medications  Medication Dose Route Frequency Provider Last Rate Last Admin   acetaminophen (TYLENOL) tablet 650 mg  650 mg Oral Q6H PRN Gertha Calkin, MD   650 mg at 04/21/23 1046   Or   acetaminophen (TYLENOL) suppository 650 mg  650 mg Rectal Q6H PRN Gertha Calkin, MD       albuterol (PROVENTIL) (2.5 MG/3ML) 0.083% nebulizer solution 3 mL  3 mL Inhalation Q6H PRN Gertha Calkin, MD   3 mL at 04/21/23 0122   arformoterol (BROVANA) nebulizer solution 15  mcg  15 mcg Nebulization BID Gertha Calkin, MD   15 mcg at 04/21/23 1028   And   umeclidinium bromide (INCRUSE ELLIPTA) 62.5 MCG/ACT 1 puff  1 puff Inhalation Daily Irena Cords V, MD   1 puff at 04/21/23 1028   busPIRone (BUSPAR) tablet 5 mg  5 mg Oral TID Gertha Calkin, MD   5 mg at 04/21/23 0949   [START ON 04/24/2023] cinacalcet (SENSIPAR) tablet 60 mg  60 mg Oral Once per day on Monday Friday Gertha Calkin, MD       And   [START ON 04/22/2023] cinacalcet (SENSIPAR) tablet 90 mg  90 mg Oral Every Wednesday Gertha Calkin, MD       pantoprazole (PROTONIX) EC tablet 40 mg  40 mg Oral BID Wilmer Floor, RPH   40 mg at 04/21/23 1610   polyvinyl alcohol (LIQUIFILM TEARS) 1.4 % ophthalmic solution 1 drop  1 drop Both Eyes PRN Kc, Ramesh, MD       sodium chloride flush (NS) 0.9 % injection 3 mL  3 mL Intravenous Q12H Irena Cords V, MD   3 mL at 04/21/23 0950   zolpidem (AMBIEN) tablet 5 mg  5 mg Oral QHS PRN Kc, Dayna Barker, MD          Review of Systems: 10 systems reviewed and negative except per interval history/subjective  Physical Exam: Vitals:   04/21/23 1215 04/21/23 1303  BP: 126/64 131/65  Pulse: 69 71  Resp: 19 15  Temp:  98 F (36.7 C)  SpO2: 100% 100%   No intake/output data recorded.  Intake/Output Summary (Last 24 hours) at 04/21/2023 1410 Last data  filed at 04/21/2023 1300 Gross per 24 hour  Intake 0 ml  Output 0 ml  Net 0 ml   Constitutional: lying in bed, no acute distress ENMT: ears and nose without scars or lesions, MMM CV: normal rate, no edema Respiratory: bilateral chest rise, normal work of breathing Gastrointestinal: soft, non-tender, no palpable masses or hernias Skin: no visible lesions or rashes Psych: alert, judgement/insight appropriate, appropriate mood and affect   Test Results I personally reviewed new and old clinical labs and radiology tests Lab Results  Component Value Date   NA 131 (L) 04/21/2023   K 3.2 (L) 04/21/2023   CL 104 04/21/2023    CO2 24 04/21/2023   BUN 15 04/21/2023   CREATININE 7.13 (H) 04/21/2023   CALCIUM 8.2 (L) 04/21/2023   ALBUMIN 2.0 (L) 04/21/2023   PHOS 3.7 03/29/2023    CBC Recent Labs  Lab 04/20/23 0827 04/20/23 1010 04/21/23 0450  WBC 13.8*  --  27.6*  HGB 13.1 15.0 10.0*  HCT 44.6 44.0 32.7*  MCV 99.3  --  98.2  PLT 62*  --  46*

## 2023-04-21 NOTE — Progress Notes (Signed)
  Echocardiogram 2D Echocardiogram has been performed.  Jodi Bell 04/21/2023, 10:22 AM

## 2023-04-21 NOTE — Plan of Care (Signed)

## 2023-04-21 NOTE — Progress Notes (Signed)
 PROGRESS NOTE Jodi Bell  WJX:[id number] DOB: 06-30-1960 DOA: 04/20/2023 PCP: Genelle Gather, MD  Brief Narrative/Hospital Course: 63 year old female with ESRD on HD MWF HTN, COPD/asthma T2DM OSA PAD/history of TAVR 12/2022, history of GI bleed, sleep apnea anxiety/chronic insomnia sent to the ED from dialysis center due to hypotension and shortness of breath, dialysis was started mid treatment due to low blood pressure and patient having nausea and dry heaving and feeling short of breath although not hypoxic and patient is concerned she might lose control of her bowels.  She had this similar episodes last few PRN treatments including Friday.  Patient had recent dental work, has not been eating well and has lost weight-her weight in November was 68 kg and in feb 55kg. Patient underwent further workup vitals showed hypotensive 70s improved with fluid bolus in the ED, got a dose of midodrine placed on supplemental oxygen, labs with hypokalemia will close 68 creatinine 6.5 albumin 2.3 normal LFTs lactic acid, and had leukocytosis 13.8 and thrombocytopenia 62K INR 1.7.  Chest x-ray unremarkable   Significant procedure/imaging: CT chest abdomen pelvis without contrast 3/10: Slight ascending colon wall thickening new from 03/31/2023 difficult to exclude colitis trace pleural effusion and dependent atelectasis, small ascites small pulmonary nodules stable aortic atherosclerosis enlarged pulmonary trunk indicating PAH.     Subjective: Seen and examined this morning Sitting comfortably denies fever chills nausea vomiting Requesting urine to be checked  Assessment and Plan: Principal Problem:   Hypotension Active Problems:   Non-insulin dependent type 2 diabetes mellitus (HCC)   Essential hypertension   COPD (chronic obstructive pulmonary disease) (HCC)   Tobacco use   GI bleeding   Coronary artery disease involving coronary bypass graft of native heart with angina pectoris (HCC)   Paroxysmal  atrial fibrillation (HCC)   Acute on chronic respiratory failure with hypoxia (HCC)   Thrombocytopenia (HCC)   ESRD on dialysis (HCC)   Cirrhosis of liver (HCC)  Hypotension Leukocytosis: BP improved with IV fluids and midodrine in the ED.On multiple meds at home clonidine/Coreg/Imdur/nifedipine, and also recent weight loss due to dental extraction and decreased oral intake-likely due to factors contributing.Patient afebrile but does have leukocytosis blood culture sent to rule out infection, has no fever.  Checking urine analysis BP has improved since initial low in the ED-cont to hold antihypertensives.May need Midodrine prn. Follow-up repeat echo to rule out cardiac etiology.Some drop in hb noted-Hemoccult ordered.  She has not been eating well and has lost weight-her weight in November was 68 kg and in feb 55kg.Check daily weight.  ESRD on HD MWF: Did not complete HD PTA.Nephrology following closely.  Liyes stable with low potassium.  Liver cirrhosis hx Thrombocytopenia Chronic anemia from renal disease/chronic disease: Trace ascites and pleural effusion on imaging.  Has low albumin likely contributing, LFTs stable .  Baseline platelet 80-100k, monitor. b/l hb~8-9 gm in Feb 2025.  On admission 13-15: likely hemoconcentrated.    Hypokalemia: Monitor and defer to nephrology 2/2 HD need. Recent Labs  Lab 04/20/23 0950 04/20/23 1010 04/21/23 0450  K 3.2* 3.2* 3.2*    On supplement oxygen 2 L: No documented hypoxia, will try to wean off oxygen  PAF: NSR.No anticoagulation except for aspirin.   Elevated troponins 812>151 CAD S/P CABG HX of TAVR: No chest pain, troponin elevation unclear etiology likely from demand ischemia.  Follow-up echocardiogram. Aspirin held on admission due to thrombocytopenia.  History of GI bleeding: She had EGD in the past needing  APC.Monitor hb.  COPD: Not in exacerbation continue inhaler as needed, and home tiotropium Singulair.  T2DM W/  borderline hypoglycemia in 60s in ED: BS well-controlled resolved.  Recent Labs  Lab 04/20/23 1459  GLUCAP 76    Gout: Resume allopurinol.   DVT prophylaxis: SCDs Start: 04/20/23 1921 Code Status:   Code Status: Full Code Family Communication: plan of care discussed with patient/SISTER on phone Patient status is: Remains hospitalized because of severity of illness Level of care: Progressive   Dispo: The patient is from: Home            Anticipated disposition: TBD.  Getting PT OT evaluation Objective: Vitals last 24 hrs: Vitals:   04/21/23 0240 04/21/23 0451 04/21/23 0645 04/21/23 0900  BP:  118/61 (!) 117/58 128/60  Pulse:  67 66 70  Resp:  19 16 (!) 23  Temp: 98 F (36.7 C) 98.1 F (36.7 C)    TempSrc: Oral     SpO2:  100% 100% 100%   Weight change:   Physical Examination: General exam: alert awake,at baseline, older than stated age HEENT:Oral mucosa moist, Ear/Nose WNL grossly Respiratory system: Bilaterally clear BS,no use of accessory muscle Cardiovascular system: S1 & S2 +, No JVD. Gastrointestinal system: Abdomen soft,NT,ND, BS+ Nervous System: Alert, awake, moving all extremities,and following commands. Extremities: LE edema neg,distal peripheral pulses palpable and warm.  Skin: No rashes,no icterus. MSK: Normal muscle bulk,tone, power   Medications reviewed:  Scheduled Meds:  arformoterol  15 mcg Nebulization BID   And   umeclidinium bromide  1 puff Inhalation Daily   busPIRone  5 mg Oral TID   [START ON 04/24/2023] cinacalcet  60 mg Oral Once per day on Monday Friday   And   [START ON 04/22/2023] cinacalcet  90 mg Oral Every Wednesday   pantoprazole  40 mg Oral BID   sodium chloride flush  3 mL Intravenous Q12H  Continuous Infusions:   Diet Order             Diet renal with fluid restriction Fluid restriction: 1200 mL Fluid; Room service appropriate? Yes; Fluid consistency: Thin  Diet effective now                 No intake or output data in  the 24 hours ending 04/21/23 0947 Net IO Since Admission: No IO data has been entered for this period [04/21/23 0947]  Wt Readings from Last 3 Encounters:  04/01/23 55.4 kg  01/29/23 64 kg  01/06/23 68.1 kg    Unresulted Labs (From admission, onward)     Start     Ordered   04/22/23 0500  Basic metabolic panel  Daily,   R      04/21/23 0836   04/22/23 0500  CBC  Daily,   R      04/21/23 0836   04/21/23 0837  Procalcitonin  Add-on,   AD       References:    Procalcitonin Lower Respiratory Tract Infection AND Sepsis Procalcitonin Algorithm   04/21/23 0836   Unscheduled  Occult blood card to lab, stool RN will collect  As needed,   R     Question:  Specimen to be collected by:  Answer:  RN will collect   04/20/23 1844          Data Reviewed: I have personally reviewed following labs and imaging studies CBC: Recent Labs  Lab 04/20/23 0827 04/20/23 1010 04/21/23 0450  WBC 13.8*  --  27.6*  HGB 13.1 15.0 10.0*  HCT 44.6 44.0 32.7*  MCV 99.3  --  98.2  PLT 62*  --  46*  Basic Metabolic Panel:  Recent Labs  Lab 04/20/23 0950 04/20/23 1010 04/21/23 0450  NA 138 135 131*  K 3.2* 3.2* 3.2*  CL 101 100 104  CO2 27  --  24  GLUCOSE 68* 64* 80  BUN 13 14 15   CREATININE 6.51* 7.30* 7.13*  CALCIUM 9.8  --  8.2*  GFR: CrCl cannot be calculated (Unknown ideal weight.). Liver Function Tests:  Recent Labs  Lab 04/20/23 0937 04/21/23 0450  AST 19 15  ALT 9 8  ALKPHOS 81 70  BILITOT 0.8 0.4  PROT 5.3* 4.2*  ALBUMIN 2.3* 2.0*  Sepsis Labs: Recent Labs  Lab 04/20/23 1010  LATICACIDVEN 1.6   Recent Results (from the past 240 hours)  Blood Culture (routine x 2)     Status: None (Preliminary result)   Collection Time: 04/20/23  9:14 AM   Specimen: BLOOD RIGHT HAND  Result Value Ref Range Status   Specimen Description BLOOD RIGHT HAND  Final   Special Requests   Final    BOTTLES DRAWN AEROBIC ONLY Blood Culture results may not be optimal due to an inadequate volume of  blood received in culture bottles   Culture   Final    NO GROWTH < 24 HOURS Performed at [facility], [address] [place], [place] [address]    Report Status PENDING  Incomplete  Blood Culture (routine x 2)     Status: None (Preliminary result)   Collection Time: 04/20/23  9:37 AM   Specimen: BLOOD RIGHT ARM  Result Value Ref Range Status   Specimen Description BLOOD RIGHT ARM  Final   Special Requests   Final    BOTTLES DRAWN AEROBIC AND ANAEROBIC Blood Culture results may not be optimal due to an inadequate volume of blood received in culture bottles   Culture   Final    NO GROWTH < 24 HOURS Performed at [facility], [address] [place], [place] [address]    Report Status PENDING  Incomplete    Antimicrobials/Microbiology: Anti-infectives (From admission, onward)    None         Component Value Date/Time   SDES BLOOD RIGHT ARM 04/20/2023 0937   SPECREQUEST  04/20/2023 1914    BOTTLES DRAWN AEROBIC AND ANAEROBIC Blood Culture results may not be optimal due to an inadequate volume of blood received in culture bottles   CULT  04/20/2023 0937    NO GROWTH < 24 HOURS Performed at [facility], [address], [place], [place] [address]    REPTSTATUS PENDING 04/20/2023 6213   Radiology Studies: CT CHEST ABDOMEN PELVIS WO CONTRAST Result Date: 04/20/2023 CLINICAL DATA:  Sepsis. EXAM: CT CHEST, ABDOMEN AND PELVIS WITHOUT CONTRAST TECHNIQUE: Multidetector CT imaging of the chest, abdomen and pelvis was performed following the standard protocol without IV contrast. RADIATION DOSE REDUCTION: This exam was performed according to the departmental dose-optimization program which includes automated exposure control, adjustment of the mA and/or kV according to patient size and/or use of iterative reconstruction technique. COMPARISON:  CT abdomen pelvis 03/31/2023, CT chest 07/24/2022. FINDINGS: CT CHEST FINDINGS Cardiovascular: Atherosclerotic calcification of the  aorta. Aortic valve repair. Three-vessel coronary artery calcification. Enlarged pulmonic trunk and heart. No pericardial effusion. Mediastinum/Nodes: No pathologically enlarged mediastinal or axillary lymph nodes. Hilar regions are difficult to definitively evaluate without IV contrast. Debris and air in a dilated  esophagus. Lungs/Pleura: Very minimal patchy ground-glass in the right upper lobe, likely infectious/inflammatory in etiology. A few scattered pulmonary nodules measure 5 mm or less in size, unchanged. Small right pleural effusion and trace left pleural effusion with dependent atelectasis in the lower lobes. No pleural fluid. Airway is unremarkable. Musculoskeletal: Degenerative changes in the spine. Increased bone density is in keeping with renal osteodystrophy. CT ABDOMEN PELVIS FINDINGS Hepatobiliary: Probable small cyst in the inferior right hepatic lobe. No specific follow-up necessary. Gallbladder is minimally prominent and the wall appears slightly thickened, unchanged from 07/24/2022. No biliary ductal dilatation. Pancreas: Negative. Spleen: Negative. Adrenals/Urinary Tract: Adrenal glands are unremarkable. Numerous low-attenuation lesions in the kidneys. No specific follow-up necessary. Bilateral renal stones. Ureters are decompressed. Bladder is very low in volume. Stomach/Bowel: Similar chronic distal gastric wall thickening. Stomach, small bowel and appendix are otherwise grossly unremarkable. Slight thickening of the wall of the ascending colon, new from 03/31/2023. Colon is otherwise grossly unremarkable. Vascular/Lymphatic: Atherosclerotic calcification of the aorta. No pathologically enlarged lymph nodes. Reproductive: Uterus is visualized.  No adnexal mass. Other: Small ascites.  Diffuse body wall edema. Musculoskeletal: Degenerative changes in the spine. Increased bone density, indicative of renal osteodystrophy. L3-4 posterior lumbar interbody fusion. Old L2 compression fracture.  IMPRESSION: 1. Slight ascending colonic wall thickening, new from 03/31/2023. Difficult to exclude colitis. 2. Tiny right and trace left pleural effusions with minimal dependent atelectasis in both lower lobes. 3. Small ascites. 4. Debris and air in a dilated esophagus, indicative of dysmotility. 5. Small pulmonary nodules, stable from 07/24/2022. Recommend attention on follow-up. 6. Bilateral renal stones. 7. Aortic atherosclerosis (ICD10-I70.0). Three-vessel coronary artery calcification. 8. Enlarged pulmonic trunk, indicative of pulmonary arterial hypertension. Electronically Signed   By: Leanna Battles M.D.   On: 04/20/2023 16:30   DG Chest Port 1 View Result Date: 04/20/2023 CLINICAL DATA:  Questionable sepsis - evaluate for abnormality EXAM: PORTABLE CHEST 1 VIEW COMPARISON:  03/28/2023. FINDINGS: Low lung volume. There are atelectatic changes/scarring at the left lung base. Bilateral lung fields are otherwise clear. Bilateral costophrenic angles are clear. Note is made of elevated right hemidiaphragm. Stable mildly enlarged cardio-mediastinal silhouette. There are surgical staples along the heart border and sternotomy wires, status post CABG (coronary artery bypass graft). Prosthetic aortic valve seen. No acute osseous abnormalities. Lower cervical spinal fixation hardware partially seen. The soft tissues are within normal limits. IMPRESSION: No active disease. Electronically Signed   By: Jules Schick M.D.   On: 04/20/2023 10:21   LOS: 0 days   Total time spent in review of labs and imaging, patient evaluation, formulation of plan, documentation and communication with family: 35 minutes  Lanae Boast, MD  [facility]  04/21/2023, 9:47 AM

## 2023-04-21 NOTE — Care Management Obs Status (Signed)
 MEDICARE OBSERVATION STATUS NOTIFICATION   Patient Details  Name: Jodi Bell MRN: [id number] Date of Birth: Aug 24, 1960   Medicare Observation Status Notification Given:  Yes    Lorri [name], LCSW 04/21/2023, 4:21 PM

## 2023-04-22 DIAGNOSIS — I350 Nonrheumatic aortic (valve) stenosis: Secondary | ICD-10-CM

## 2023-04-22 DIAGNOSIS — D7219 Other eosinophilia: Secondary | ICD-10-CM

## 2023-04-22 DIAGNOSIS — I959 Hypotension, unspecified: Secondary | ICD-10-CM | POA: Diagnosis not present

## 2023-04-22 LAB — GLUCOSE, CAPILLARY
Glucose-Capillary: 66 mg/dL — ABNORMAL LOW (ref 70–99)
Glucose-Capillary: 63 mg/dL — ABNORMAL LOW (ref 70–99)
Glucose-Capillary: 68 mg/dL — ABNORMAL LOW (ref 70–99)
Glucose-Capillary: 63 mg/dL — ABNORMAL LOW (ref 70–99)
Glucose-Capillary: 70 mg/dL (ref 70–99)
Glucose-Capillary: 125 mg/dL — ABNORMAL HIGH (ref 70–99)

## 2023-04-22 LAB — CBC WITH DIFFERENTIAL/PLATELET
Abs Immature Granulocytes: 0.09 10*3/uL — ABNORMAL HIGH (ref 0.00–0.07)
Basophils Absolute: 0.2 10*3/uL — ABNORMAL HIGH (ref 0.0–0.1)
Basophils Relative: 1 %
Eosinophils Absolute: 23.4 10*3/uL — ABNORMAL HIGH (ref 0.0–0.5)
Eosinophils Relative: 84 %
HCT: 38 % (ref 36.0–46.0)
Hemoglobin: 11.9 g/dL — ABNORMAL LOW (ref 12.0–15.0)
Immature Granulocytes: 0 %
Lymphocytes Relative: 8 %
Lymphs Abs: 2.2 10*3/uL (ref 0.7–4.0)
MCH: 30.1 pg (ref 26.0–34.0)
MCHC: 31.3 g/dL (ref 30.0–36.0)
MCV: 96.2 fL (ref 80.0–100.0)
Monocytes Absolute: 0.3 10*3/uL (ref 0.1–1.0)
Monocytes Relative: 1 %
Neutro Abs: 1.6 10*3/uL — ABNORMAL LOW (ref 1.7–7.7)
Neutrophils Relative %: 6 %
Platelets: 53 10*3/uL — ABNORMAL LOW (ref 150–400)
RBC: 3.95 MIL/uL (ref 3.87–5.11)
RDW: 19.6 % — ABNORMAL HIGH (ref 11.5–15.5)
Smear Review: NORMAL
WBC: 27.8 10*3/uL — ABNORMAL HIGH (ref 4.0–10.5)
nRBC: 0 % (ref 0.0–0.2)

## 2023-04-22 LAB — BASIC METABOLIC PANEL
Anion gap: 9 (ref 5–15)
BUN: 20 mg/dL (ref 8–23)
CO2: 24 mmol/L (ref 22–32)
Calcium: 9.1 mg/dL (ref 8.9–10.3)
Chloride: 98 mmol/L (ref 98–111)
Creatinine, Ser: 8.16 mg/dL — ABNORMAL HIGH (ref 0.44–1.00)
GFR, Estimated: 5 mL/min — ABNORMAL LOW (ref >60)
Glucose, Bld: 64 mg/dL — ABNORMAL LOW (ref 70–99)
Potassium: 3.9 mmol/L (ref 3.5–5.1)
Sodium: 131 mmol/L — ABNORMAL LOW (ref 135–145)

## 2023-04-22 LAB — CBC
HCT: 35.7 % — ABNORMAL LOW (ref 36.0–46.0)
Hemoglobin: 11 g/dL — ABNORMAL LOW (ref 12.0–15.0)
MCH: 30 pg (ref 26.0–34.0)
MCHC: 30.8 g/dL (ref 30.0–36.0)
MCV: 97.3 fL (ref 80.0–100.0)
Platelets: 48 10*3/uL — ABNORMAL LOW (ref 150–400)
RBC: 3.67 MIL/uL — ABNORMAL LOW (ref 3.87–5.11)
RDW: 19.7 % — ABNORMAL HIGH (ref 11.5–15.5)
WBC: 28 10*3/uL — ABNORMAL HIGH (ref 4.0–10.5)
nRBC: 0 % (ref 0.0–0.2)

## 2023-04-22 MED ORDER — ONDANSETRON HCL 4 MG/2ML IJ SOLN
4.0000 mg | Freq: Once | INTRAMUSCULAR | Status: AC
  Administered 2023-04-22: 4 mg via INTRAVENOUS
  Filled 2023-04-22: qty 2

## 2023-04-22 MED ORDER — DEXTROSE 50 % IV SOLN
12.5000 g | INTRAVENOUS | Status: AC
  Administered 2023-04-22: 12.5 g via INTRAVENOUS

## 2023-04-22 MED ORDER — DEXTROSE 50 % IV SOLN
INTRAVENOUS | Status: AC
  Filled 2023-04-22: qty 50

## 2023-04-22 MED ORDER — HYDROXYZINE HCL 25 MG PO TABS
25.0000 mg | ORAL_TABLET | Freq: Once | ORAL | Status: AC
  Administered 2023-04-22: 25 mg via ORAL
  Filled 2023-04-22: qty 1

## 2023-04-22 MED ORDER — ALUM & MAG HYDROXIDE-SIMETH 200-200-20 MG/5ML PO SUSP
30.0000 mL | Freq: Four times a day (QID) | ORAL | Status: DC | PRN
  Administered 2023-04-23: 30 mL via ORAL
  Filled 2023-04-22: qty 30

## 2023-04-22 MED ORDER — CHLORHEXIDINE GLUCONATE CLOTH 2 % EX PADS
6.0000 | MEDICATED_PAD | Freq: Every day | CUTANEOUS | Status: DC
  Administered 2023-04-23 – 2023-04-24 (×3): 6 via TOPICAL

## 2023-04-22 MED ORDER — DEXTROSE 5 % IV SOLN: INTRAVENOUS | Status: AC

## 2023-04-22 MED ORDER — PANTOPRAZOLE SODIUM 40 MG IV SOLR
40.0000 mg | Freq: Two times a day (BID) | INTRAVENOUS | Status: DC
  Administered 2023-04-23 – 2023-04-25 (×5): 40 mg via INTRAVENOUS
  Filled 2023-04-22 (×6): qty 10

## 2023-04-22 NOTE — Evaluation (Signed)
 Occupational Therapy Evaluation Patient Details Name: Jodi Bell MRN: [id number] DOB: 1960/09/26 Today's Date: 04/22/2023   History of Present Illness   63 year old female sent to the ED from dialysis center due to hypotension and shortness of breath, PMH with ESRD on HD MWF HTN, COPD/asthma T2DM OSA PAD/history of TAVR 12/2022, history of GI bleed, sleep apnea anxiety/chronic insomnia     Clinical Impressions Pt c/o fatigue, some agitation about having to perform OOB activities, wants to rest and be left alone, agreeable to answer questions and to minimal activity for a short amount of time. Pt lives at home with daughter and son in law who work during the day, PLOF mod I for ADLs, children help with IADLs. Pt currently close to baseline, limited due to fatigue and recent BP drops with OOB activity, was able to ambulate at supervision level with RW to bathroom and back with nurse tech earlier. Pt ableto complete bed mobility at supervision level for safety. Pt would benefit from further acute OT to maximize activity tolerance and participation in ADLs, HHOT recommended at this time but Pt will likely decline, she is eager to return home. No DME needs expected.      If plan is discharge home, recommend the following:   A little help with walking and/or transfers;A little help with bathing/dressing/bathroom;Assistance with cooking/housework;Assist for transportation;Help with stairs or ramp for entrance     Functional Status Assessment   Patient has had a recent decline in their functional status and demonstrates the ability to make significant improvements in function in a reasonable and predictable amount of time.     Equipment Recommendations   None recommended by OT     Recommendations for Other Services         Precautions/Restrictions   Precautions Precautions: Fall Recall of Precautions/Restrictions: Intact Restrictions Weight Bearing Restrictions Per Provider  Order: No     Mobility Bed Mobility Overal bed mobility: Needs Assistance Bed Mobility: Supine to Sit, Sit to Supine     Supine to sit: Supervision Sit to supine: Supervision   General bed mobility comments: supervision for safety    Transfers Overall transfer level: Needs assistance Equipment used: Rolling walker (2 wheels) Transfers: Sit to/from Stand Sit to Stand: Supervision           General transfer comment: supervision for safety      Balance Overall balance assessment: History of Falls, Needs assistance Sitting-balance support: No upper extremity supported, Feet supported Sitting balance-Leahy Scale: Good         Standing balance comment: refused to stand, per RN able to stand and ambulate with RW as needed                           ADL either performed or assessed with clinical judgement   ADL Overall ADL's : Needs assistance/impaired                                       General ADL Comments: limited eval, Pt able to complete bed mobility and transfer to toilet at supervision level using RW, refused to complete ADLs.     Vision Baseline Vision/History: 1 Wears glasses Ability to See in Adequate Light: 0 Adequate Patient Visual Report: No change from baseline       Perception         Praxis  Pertinent Vitals/Pain Pain Assessment Pain Assessment: No/denies pain     Extremity/Trunk Assessment Upper Extremity Assessment Upper Extremity Assessment: Generalized weakness           Communication Communication Communication: No apparent difficulties   Cognition Arousal: Alert Behavior During Therapy: Agitated Cognition: No apparent impairments             OT - Cognition Comments: grossly oriented, agitated about performing OOB activity                 Following commands: Intact       Cueing  General Comments   Cueing Techniques: Verbal cues      Exercises     Shoulder  Instructions      Home Living Family/patient expects to be discharged to:: Private residence Living Arrangements: Children Available Help at Discharge: Family;Available PRN/intermittently Type of Home: House Home Access: Level entry     Home Layout: Multi-level;Able to live on main level with bedroom/bathroom Alternate Level Stairs-Number of Steps: 13 Alternate Level Stairs-Rails: Left Bathroom Shower/Tub: Tub/shower unit   Bathroom Toilet: Standard Bathroom Accessibility: Yes   Home Equipment: Cane - single point;Rolling Walker (2 wheels);Rollator (4 wheels);Shower seat;Toilet riser;Grab bars - tub/shower;Hand held shower head   Additional Comments: Pt lives with daughter and son in law      Prior Functioning/Environment Prior Level of Function : Independent/Modified Independent             Mobility Comments: family takes pt to HD, uses cane in home and rollator out of home ADLs Comments: son in law does pt's laundry, and tries to assist her with meals. sits for showers, reports ind with ADLs but has difficulty finding the energy for it. Does not drive    OT Problem List: Decreased strength;Decreased activity tolerance;Impaired balance (sitting and/or standing);Cardiopulmonary status limiting activity;Decreased safety awareness   OT Treatment/Interventions: Self-care/ADL training;Therapeutic exercise;DME and/or AE instruction;Balance training;Patient/family education;Therapeutic activities;Energy conservation      OT Goals(Current goals can be found in the care plan section)   Acute Rehab OT Goals Patient Stated Goal: to go home OT Goal Formulation: With patient Time For Goal Achievement: 04/29/23 Potential to Achieve Goals: Good   OT Frequency:  Min 1X/week    Co-evaluation              AM-PAC OT "6 Clicks" Daily Activity     Outcome Measure Help from another person eating meals?: None Help from another person taking care of personal grooming?: A  Little Help from another person toileting, which includes using toliet, bedpan, or urinal?: A Little Help from another person bathing (including washing, rinsing, drying)?: A Little Help from another person to put on and taking off regular upper body clothing?: A Little Help from another person to put on and taking off regular lower body clothing?: A Little 6 Click Score: 19   End of Session Nurse Communication: Mobility status  Activity Tolerance: Treatment limited secondary to agitation Patient left: in bed;with call bell/phone within reach  OT Visit Diagnosis: Unsteadiness on feet (R26.81);History of falling (Z91.81);Muscle weakness (generalized) (M62.81)                Time: 1610-9604 OT Time Calculation (min): 15 min Charges:  OT General Charges $OT Visit: 1 Visit OT Evaluation $OT Eval Low Complexity: 1 Low  [address], OTR/L   Alexis Goodell 04/22/2023, 1:21 PM

## 2023-04-22 NOTE — Progress Notes (Signed)
  [facility] Progress Note   Subjective: Seen in room. Doesn't feel good with nausea/vomiting this am. Also coughing and wheezing.   Objective Vitals:   04/22/23 0500 04/22/23 0627 04/22/23 0809 04/22/23 0838  BP:  (!) 152/68 (!) 170/72   Pulse:  80 74   Resp:  17    Temp:  97.6 F (36.4 C) 97.9 F (36.6 C)   TempSrc:  Oral Oral   SpO2:  100% 100% 99%  Weight: 55 kg     Height:        Additional Objective Labs: Basic Metabolic Panel: Recent Labs  Lab 04/20/23 0950 04/20/23 1010 04/21/23 0450 04/22/23 0546  NA 138 135 131* 131*  K 3.2* 3.2* 3.2* 3.9  CL 101 100 104 98  CO2 27  --  24 24  GLUCOSE 68* 64* 80 64*  BUN 13 14 15 20   CREATININE 6.51* 7.30* 7.13* 8.16*  CALCIUM 9.8  --  8.2* 9.1  PHOS  --   --  3.1  --    CBC: Recent Labs  Lab 04/20/23 0827 04/20/23 1010 04/21/23 0450 04/22/23 0546  WBC 13.8*  --  27.6* 28.0*  HGB 13.1 15.0 10.0* 11.0*  HCT 44.6 44.0 32.7* 35.7*  MCV 99.3  --  98.2 97.3  PLT 62*  --  46* 48*   Blood Culture    Component Value Date/Time   SDES BLOOD RIGHT ARM 04/20/2023 0937   SPECREQUEST  04/20/2023 0937    BOTTLES DRAWN AEROBIC AND ANAEROBIC Blood Culture results may not be optimal due to an inadequate volume of blood received in culture bottles   CULT  04/20/2023 0937    NO GROWTH 2 DAYS Performed at [facility], [address] [place], [place] [address]    REPTSTATUS PENDING 04/20/2023 2355     Physical Exam General: Alert, nad, on nasal oxygen Heart: RRR Lungs: Diminished  Abdomen: soft no masses  Extremities: Trace LE edema  Dialysis Access: LUF AVF +bruit   Medications:   arformoterol  15 mcg Nebulization BID   And   umeclidinium bromide  1 puff Inhalation Daily   busPIRone  5 mg Oral TID   [START ON 04/24/2023] cinacalcet  60 mg Oral Once per day on Monday Friday   And   cinacalcet  90 mg Oral Every Wednesday   pantoprazole  40 mg Oral BID   sodium chloride flush  3 mL  Intravenous Q12H    Dialysis Orders: [facility] MWF (will call for records)   Assessment/Plan: 1. Hypotension - History of being on multiple blood pressure medications. Possibly overtreated. Holding blood pressure medications for now.  2. ESRD - HD MWF. Continue on schedule. HD today.  3. Hypokalemia - resolved.  4. Volume- UF limited by hypotension. Tolerates 1-2L UF.  5. Anemia- Hgb 10-11. Follow trends.  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 CAD s/p CAGB, Hx TAVR - 8. Leukocytosis - Blood Cx NGTD. Per primary. ID consulted.  9. Liver cirrhosis: Chronic issue. Per primary  10.  COPD/Chronic respiratory failure  - 2-3L [place] at baseline per patient   Tomasa Blase PA-C [facility] 04/22/2023,11:37 AM

## 2023-04-22 NOTE — Progress Notes (Incomplete)
    Patient Name: Jodi Bell           DOB: 09/07/1960  MRN: [id number]      Admission Date: 04/20/2023  Attending Provider: Lanae Boast, MD  Primary Diagnosis: Hypotension   Level of care: Telemetry Medical    CROSS COVER NOTE   Date of Service   04/22/2023   [name], 63 y.o. female, was admitted on 04/20/2023 for Hypotension.    HPI/Events of Note   Dark Stool, Melena?  PMHx GI bleed Patient reported 5 loose bowel movements during dialysis.  On arrival to tele unit, patient had another BM.  RN reports dark stool is "mucus- like" and "smelled like blood."   No hematemesis, bright red bleeding reported.  Reports indigestion.  Maalox and IV Protonix noted. Hemodynamically stable.  Last hemoglobin 11.9.  Check hemoglobin.   Interventions/ Plan   FOBT CBC IV Protonix        Anthoney Harada, DNP, [employer] [facility] [place]

## 2023-04-22 NOTE — Progress Notes (Signed)
 PROGRESS NOTE Jodi Bell  ZOX:[id number] DOB: [birth year] DOA: 04/20/2023 PCP: Genelle Gather, MD  Brief Narrative/Hospital Course: 63 year old female with ESRD on HD MWF HTN, COPD/asthma T2DM OSA PAD/history of TAVR 12/2022, history of GI bleed, sleep apnea anxiety/chronic insomnia sent to the ED from dialysis center due to hypotension and shortness of breath, dialysis was started mid treatment due to low blood pressure and patient having nausea and dry heaving and feeling short of breath although not hypoxic and patient is concerned she might lose control of her bowels.  She had this similar episodes last few PRN treatments including Friday.  Patient had recent dental work, has not been eating well and has lost weight-her weight in November was 68 kg and in feb 55kg. Patient underwent further workup vitals showed hypotensive 70s improved with fluid bolus in the ED, got a dose of midodrine placed on supplemental oxygen, labs with hypokalemia will close 68 creatinine 6.5 albumin 2.3 normal LFTs lactic acid, and had leukocytosis 13.8 and thrombocytopenia 62K INR 1.7.  Chest x-ray unremarkable   Significant procedure/imaging: CT chest abdomen pelvis without contrast 3/10: Slight ascending colon wall thickening new from 03/31/2023 difficult to exclude colitis trace pleural effusion and dependent atelectasis, small ascites small pulmonary nodules stable aortic atherosclerosis enlarged pulmonary trunk indicating PAH.     Subjective: Seen and examined Some nausea, had BMX1 last one loose No abdomen pain, feeling cold No fever, BP running high in 150s-170s, on 2 L [place]  Labs with persistent leukocytosis  Assessment and Plan: Principal Problem:   Hypotension Active Problems:   Non-insulin dependent type 2 diabetes mellitus (HCC)   Essential hypertension   COPD (chronic obstructive pulmonary disease) (HCC)   Tobacco use   GI bleeding   Coronary artery disease involving coronary bypass graft of  native heart with angina pectoris (HCC)   Paroxysmal atrial fibrillation (HCC)   Acute on chronic respiratory failure with hypoxia (HCC)   Thrombocytopenia (HCC)   ESRD on dialysis (HCC)   Cirrhosis of liver (HCC)  Hypotension Hypotension multifactorial-On multiple meds at home clonidine/Coreg/Imdur/nifedipine, and also recent weight loss due to dental extraction and decreased oral intake. BP improved with IV fluids and midodrine in the ED. has leukocytosis see below but no obvious infection.  Echo with EF 55-60% G2 DD RV function mildly reduced RV size mildly enlarged and severely elevated pulmonary artery systolic pressure noted She has not been eating well and has lost weight-her weight in November was 68 kg and in feb 55kg  Leukocytosis: Unclear etiology, no other evidence of infection, blood culture from 3/10 NGTD.  Chest x-ray no pneumonia.  Has fistula for HD Unable to get UA despite in and out. CT abdomen difficult to exclude colitis? Checking gip and c diff- ID consulted given ESRD/immunocompromise statu and elevated procalcitonin 2.6. Recent Labs  Lab 04/20/23 0827 04/21/23 0450 04/22/23 0546  WBC 13.8* 27.6* 28.0*    ESRD on HD MWF: Nephrology following closely for HD.  Labs stable  Recent Labs    01/29/23 0716 02/18/23 0915 03/28/23 1745 03/29/23 0519 03/30/23 0509 03/31/23 0617 04/01/23 0459 04/20/23 0950 04/20/23 1010 04/21/23 0450 04/22/23 0546  BUN 21 29* 12 14 19 15 21 13 14 15 20   CREATININE 3.54* 4.64* 4.69* 5.37* 6.28* 4.81* 6.22* 6.51* 7.30* 7.13* 8.16*  CO2 27 26 24 26  21* 27 25 27   --  24 24  K 3.9 3.6 3.4* 3.2* 4.3 3.9 3.5 3.2* 3.2* 3.2* 3.9    Liver cirrhosis  hx-but CT does not report any abnormal liver finding Thrombocytopenia Chronic anemia from renal disease/chronic disease: Trace ascites and pleural effusion on imaging.Has low albumin likely contributing, LFTs stable Baseline platelet 80-100k and baseline hb~8-9 gm in Feb 2025-monitor  platelet count hemoglobin as below On admission hb 13-15: likely hemoconcentrated.  Recent Labs  Lab 04/20/23 0827 04/20/23 1010 04/21/23 0450 04/22/23 0546  HGB 13.1 15.0 10.0* 11.0*  HCT 44.6 44.0 32.7* 35.7*  WBC 13.8*  --  27.6* 28.0*  PLT 62*  --  46* 48*     Hypokalemia: Resolved.   On supplement oxygen 2 L: No documented hypoxia, will try to wean off oxygen  PAF: NSR.No anticoagulation except for aspirin.   Elevated troponins 812>151 CAD S/P CABG HX of TAVR Severe pulmonary hypertension per echo: No chest pain, troponin elevation unclear etiology likely from demand ischemia.  Echo shows normal EF G2 DD and severe pulmonary hypertension  Aspirin held on admission due to thrombocytopenia.  History of GI bleeding CT showed debris's and air in the dilated esophagus indicating dysmotility  She had EGD in the past needing  APC.no evidence of bleeding currently, obtain occult blood as able.diet as tolerated  COPD: Not in exacerbation continue inhaler as needed, and home tiotropium Singulair.  T2DM W/ borderline hypoglycemia in 60s in ED: Well-controlled-mild hypoglycemia this morning checking CBG-encourage oral intake.  Will add d5w if still low. Recent Labs  Lab 04/22/23 0919 04/22/23 0943 04/22/23 1028 04/22/23 1048 04/22/23 1110  GLUCAP 66* 68* 63* 70 63*    Gout: Cont allopurinol.   DVT prophylaxis: SCDs Start: 04/20/23 1921 Code Status:   Code Status: Full Code Family Communication: plan of care discussed with patient/none at bedside Patient status is: Remains hospitalized because of severity of illness Level of care: Telemetry Medical   Dispo: The patient is from: Home.            Anticipated disposition: Consulted PT OT  Objective: Vitals last 24 hrs: Vitals:   04/22/23 0500 04/22/23 0627 04/22/23 0809 04/22/23 0838  BP:  (!) 152/68 (!) 170/72   Pulse:  80 74   Resp:  17    Temp:  97.6 F (36.4 C) 97.9 F (36.6 C)   TempSrc:  Oral Oral    SpO2:  100% 100% 99%  Weight: 55 kg     Height:       Weight change:   Physical Examination: General exam: alert awake, oriented appears sick HEENT:Oral mucosa moist, Ear/Nose WNL grossly Respiratory system: Bilaterally clear BS,no use of accessory muscle Cardiovascular system:S1 & S2 +,No JVD. Gastrointestinal system:Abdomen soft, reducible hernia+,ND, BS+ Nervous System:Alert, awake, moving all extremities,and following commands. Extremities:LE edema mild,distal peripheral pulses palpable and warm.  Skin:No rashes,no icterus. ZOX:WRUEAV muscle bulk,tone, power.  arm avf+  Medications reviewed:  Scheduled Meds:  arformoterol  15 mcg Nebulization BID   And   umeclidinium bromide  1 puff Inhalation Daily   busPIRone  5 mg Oral TID   [START ON 04/24/2023] cinacalcet  60 mg Oral Once per day on Monday Friday   And   cinacalcet  90 mg Oral Every Wednesday   pantoprazole  40 mg Oral BID   sodium chloride flush  3 mL Intravenous Q12H  Continuous Infusions:   Diet Order             Diet renal with fluid restriction Fluid restriction: 1200 mL Fluid; Room service appropriate? Yes; Fluid consistency: Thin  Diet effective now  Intake/Output Summary (Last 24 hours) at 04/22/2023 1125 Last data filed at 04/22/2023 0756 Gross per 24 hour  Intake 480 ml  Output 0 ml  Net 480 ml   Net IO Since Admission: 480 mL [04/22/23 1125]  Wt Readings from Last 3 Encounters:  04/22/23 55 kg  04/01/23 55.4 kg  01/29/23 64 kg    Unresulted Labs (From admission, onward)     Start     Ordered   04/22/23 1048  C Difficile Quick Screen w PCR reflex  (C Difficile quick screen w PCR reflex panel )  Once, for 24 hours,   TIMED       References:    CDiff Information Tool   04/22/23 1048   04/22/23 1048  Gastrointestinal Panel by PCR , Stool  (Gastrointestinal Panel by PCR, Stool                                                                                                                                                      **Does Not include CLOSTRIDIUM DIFFICILE testing. **If CDIFF testing is needed, place order from the "C Difficile Testing" order set.**)  Once,   R        04/22/23 1048   04/22/23 0947  Differential  Add-on,   AD       Question:  Specimen collection method  Answer:  Lab=Lab collect   04/22/23 0946   04/22/23 0500  Basic metabolic panel  Daily,   R      04/21/23 0836   04/22/23 0500  CBC  Daily,   R      04/21/23 0836   Unscheduled  Occult blood card to lab, stool RN will collect  As needed,   R     Question:  Specimen to be collected by:  Answer:  RN will collect   04/20/23 1844          Data Reviewed: I have personally reviewed following labs and imaging studies CBC: Recent Labs  Lab 04/20/23 0827 04/20/23 1010 04/21/23 0450 04/22/23 0546  WBC 13.8*  --  27.6* 28.0*  HGB 13.1 15.0 10.0* 11.0*  HCT 44.6 44.0 32.7* 35.7*  MCV 99.3  --  98.2 97.3  PLT 62*  --  46* 48*  Basic Metabolic Panel:  Recent Labs  Lab 04/20/23 0950 04/20/23 1010 04/21/23 0450 04/22/23 0546  NA 138 135 131* 131*  K 3.2* 3.2* 3.2* 3.9  CL 101 100 104 98  CO2 27  --  24 24  GLUCOSE 68* 64* 80 64*  BUN 13 14 15 20   CREATININE 6.51* 7.30* 7.13* 8.16*  CALCIUM 9.8  --  8.2* 9.1  PHOS  --   --  3.1  --   GFR: Estimated Creatinine Clearance: 5.7 mL/min (A) (by C-G formula based on SCr of 8.16 mg/dL (  H)). Liver Function Tests:  Recent Labs  Lab 04/20/23 0937 04/21/23 0450  AST 19 15  ALT 9 8  ALKPHOS 81 70  BILITOT 0.8 0.4  PROT 5.3* 4.2*  ALBUMIN 2.3* 2.0*  Sepsis Labs: Recent Labs  Lab 04/20/23 1010 04/21/23 0450  PROCALCITON  --  2.65  LATICACIDVEN 1.6  --    Recent Results (from the past 240 hours)  Blood Culture (routine x 2)     Status: None (Preliminary result)   Collection Time: 04/20/23  9:14 AM   Specimen: BLOOD RIGHT HAND  Result Value Ref Range Status   Specimen Description BLOOD RIGHT HAND  Final   Special Requests   Final     BOTTLES DRAWN AEROBIC ONLY Blood Culture results may not be optimal due to an inadequate volume of blood received in culture bottles   Culture   Final    NO GROWTH 2 DAYS Performed at [facility], [address], [place], [place] [address]    Report Status PENDING  Incomplete  Blood Culture (routine x 2)     Status: None (Preliminary result)   Collection Time: 04/20/23  9:37 AM   Specimen: BLOOD RIGHT ARM  Result Value Ref Range Status   Specimen Description BLOOD RIGHT ARM  Final   Special Requests   Final    BOTTLES DRAWN AEROBIC AND ANAEROBIC Blood Culture results may not be optimal due to an inadequate volume of blood received in culture bottles   Culture   Final    NO GROWTH 2 DAYS Performed at [facility], [address], [place], [place] [address]    Report Status PENDING  Incomplete    Antimicrobials/Microbiology: Anti-infectives (From admission, onward)    None         Component Value Date/Time   SDES BLOOD RIGHT ARM 04/20/2023 0937   SPECREQUEST  04/20/2023 6440    BOTTLES DRAWN AEROBIC AND ANAEROBIC Blood Culture results may not be optimal due to an inadequate volume of blood received in culture bottles   CULT  04/20/2023 0937    NO GROWTH 2 DAYS Performed at [facility], [address] [place], [place] [address]    REPTSTATUS PENDING 04/20/2023 5956   Radiology Studies: ECHOCARDIOGRAM COMPLETE BUBBLE STUDY Result Date: 04/21/2023    ECHOCARDIOGRAM REPORT   Patient Name:   Jodi Bell Date of Exam: 04/21/2023 Medical Rec #:  [id number]        Height:       62.0 in Accession #:    [id number]       Weight:       122.1 lb Date of Birth:  04-Aug-1960         BSA:          1.550 m Patient Age:    62 years         BP:           117/58 mmHg Patient Gender: F                HR:           71 bpm. Exam Location:  Inpatient Procedure: 2D Echo, 3D Echo, Cardiac Doppler, Color Doppler, Strain Analysis and            Saline Contrast Bubble Study (Both  Spectral and Color Flow Doppler            were utilized during procedure). Indications:    Acute Hypoxemic Respiratory  Failure  History:        Patient has prior history of Echocardiogram examinations, most                 recent 02/02/2023. Prior CABG, COPD; Risk Factors:Hypertension,                 Diabetes and Former Smoker.  Sonographer:    Karma Ganja Referring Phys: [phone] EKTA V PATEL IMPRESSIONS  1. Mildly D-shaped interventricular septum suggestive of a degree of RV pressure/volume overload. Left ventricular ejection fraction, by estimation, is 55 to 60%. The left ventricle has normal function. The left ventricle has no regional wall motion abnormalities. There is moderate concentric left ventricular hypertrophy. Left ventricular diastolic parameters are consistent with Grade II diastolic dysfunction (pseudonormalization). The average left ventricular global longitudinal strain is -13.7 %. The global longitudinal strain is abnormal.  2. Right ventricular systolic function is mildly reduced. The right ventricular size is mildly enlarged. There is severely elevated pulmonary artery systolic pressure. The estimated right ventricular systolic pressure is 64.6 mmHg.  3. Left atrial size was severely dilated.  4. Right atrial size was moderately dilated.  5. Bubble study is negative (a few bubbles cross very late).  6. The mitral valve is degenerative. Trivial mitral valve regurgitation. Mild mitral stenosis. The mean mitral valve gradient is 5.0 mmHg. Moderate mitral annular calcification.  7. The tricuspid valve is abnormal. Tricuspid valve regurgitation is moderate.  8. 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 Mild perivalvular leakage. Mean gradient 36 mmmHg with dimensionless index 0.38 and EOA 1.48 cm^2. LVOT VTI 33 with SV 126 mL. There does appear to be significant bioprosthetic aortic  valve stenosis, similar gradient to prior echo. However, high gradient is likely exacerbated  by high flow.  9. The inferior vena cava is dilated in size with <50% respiratory variability, suggesting right atrial pressure of 15 mmHg. FINDINGS  Left Ventricle: Mildly D-shaped interventricular septum suggestive of a degree of RV pressure/volume overload. Left ventricular ejection fraction, by estimation, is 55 to 60%. The left ventricle has normal function. The left ventricle has no regional wall motion abnormalities. The average left ventricular global longitudinal strain is -13.7 %. Strain was performed and the global longitudinal strain is abnormal. The left ventricular internal cavity size was normal in size. There is moderate concentric  left ventricular hypertrophy. Left ventricular diastolic parameters are consistent with Grade II diastolic dysfunction (pseudonormalization). Right Ventricle: The right ventricular size is mildly enlarged. No increase in right ventricular wall thickness. Right ventricular systolic function is mildly reduced. There is severely elevated pulmonary artery systolic pressure. The tricuspid regurgitant velocity is 3.52 m/s, and with an assumed right atrial pressure of 15 mmHg, the estimated right ventricular systolic pressure is 64.6 mmHg. Left Atrium: Left atrial size was severely dilated. Right Atrium: Right atrial size was moderately dilated. Pericardium: There is no evidence of pericardial effusion. Mitral Valve: The mitral valve is degenerative in appearance. There is mild calcification of the mitral valve leaflet(s). Moderate mitral annular calcification. Trivial mitral valve regurgitation. Mild mitral valve stenosis. MV peak gradient, 13.1 mmHg. The mean mitral valve gradient is 5.0 mmHg. Tricuspid Valve: The tricuspid valve is abnormal. Tricuspid valve regurgitation is moderate. 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 Mild perivalvular leakage. Mean gradient 36 mmmHg with dimensionless index 0.38 and EOA 1.48 cm^2. LVOT VTI 33  with SV 126 mL. There does appear to be significant bioprosthetic  aortic valve stenosis, similar gradient to prior echo. However, high gradient is likely exacerbated by high flow. The aortic valve has been repaired/replaced. Aortic valve regurgitation is mild. Aortic valve mean gradient measures 36.0 mmHg. Aortic valve peak gradient measures 62.4 mmHg. Aortic valve area, by VTI measures 1.48 cm. Pulmonic Valve: The pulmonic valve was normal in structure. Pulmonic valve regurgitation is trivial. Aorta: The aortic root is normal in size and structure. Venous: The inferior vena cava is dilated in size with less than 50% respiratory variability, suggesting right atrial pressure of 15 mmHg. IAS/Shunts: Bubble study is negative (a few bubbles cross very late). Agitated saline contrast was given intravenously to evaluate for intracardiac shunting.  LEFT VENTRICLE PLAX 2D LVIDd:         3.70 cm   Diastology LVIDs:         2.40 cm   LV e' medial:    5.22 cm/s LV PW:         2.40 cm   LV E/e' medial:  31.0 LV IVS:        1.50 cm   LV e' lateral:   6.53 cm/s LVOT diam:     2.20 cm   LV E/e' lateral: 24.8 LV SV:         126 LV SV Index:   81        2D Longitudinal Strain LVOT Area:     3.80 cm  2D Strain GLS Avg:     -13.7 %                           3D Volume EF:                          3D EF:        57 %                          LV EDV:       159 ml                          LV ESV:       69 ml                          LV SV:        90 ml RIGHT VENTRICLE            IVC RV Basal diam:  4.60 cm    IVC diam: 2.30 cm RV S prime:     7.51 cm/s TAPSE (M-mode): 1.4 cm LEFT ATRIUM              Index         RIGHT ATRIUM           Index LA diam:        4.50 cm  2.90 cm/m    RA Area:     24.10 cm LA Vol (A2C):   158.0 ml 101.92 ml/m  RA Volume:   71.00 ml  45.80 ml/m LA Vol (A4C):   83.5 ml  53.86 ml/m LA Biplane Vol: 117.0 ml 75.47 ml/m  AORTIC VALVE AV Area (Vmax):    1.44 cm AV Area (Vmean):   1.37 cm AV Area (VTI):      1.48 cm AV Vmax:  395.00 cm/s AV Vmean:          280.000 cm/s AV VTI:            0.853 m AV Peak Grad:      62.4 mmHg AV Mean Grad:      36.0 mmHg LVOT Vmax:         150.00 cm/s LVOT Vmean:        101.000 cm/s LVOT VTI:          0.332 m LVOT/AV VTI ratio: 0.39  AORTA Ao Root diam: 2.80 cm Ao Asc diam:  2.80 cm MITRAL VALVE                TRICUSPID VALVE MV Area (PHT): 3.48 cm     TR Peak grad:   49.6 mmHg MV Area VTI:   2.75 cm     TR Vmax:        352.00 cm/s MV Peak grad:  13.1 mmHg MV Mean grad:  5.0 mmHg     SHUNTS MV Vmax:       1.81 m/s     Systemic VTI:  0.33 m MV Vmean:      109.0 cm/s   Systemic Diam: 2.20 cm MV Decel Time: 218 msec MV E velocity: 162.00 cm/s MV A velocity: 126.00 cm/s MV E/A ratio:  1.29 Dalton McleanMD Electronically signed by Wilfred Lacy Signature Date/Time: 04/21/2023/10:56:31 AM    Final    CT CHEST ABDOMEN PELVIS WO CONTRAST Result Date: 04/20/2023 CLINICAL DATA:  Sepsis. EXAM: CT CHEST, ABDOMEN AND PELVIS WITHOUT CONTRAST TECHNIQUE: Multidetector CT imaging of the chest, abdomen and pelvis was performed following the standard protocol without IV contrast. RADIATION DOSE REDUCTION: This exam was performed according to the departmental dose-optimization program which includes automated exposure control, adjustment of the mA and/or kV according to patient size and/or use of iterative reconstruction technique. COMPARISON:  CT abdomen pelvis 03/31/2023, CT chest 07/24/2022. FINDINGS: CT CHEST FINDINGS Cardiovascular: Atherosclerotic calcification of the aorta. Aortic valve repair. Three-vessel coronary artery calcification. Enlarged pulmonic trunk and heart. No pericardial effusion. Mediastinum/Nodes: No pathologically enlarged mediastinal or axillary lymph nodes. Hilar regions are difficult to definitively evaluate without IV contrast. Debris and air in a dilated esophagus. Lungs/Pleura: Very minimal patchy ground-glass in the right upper lobe, likely  infectious/inflammatory in etiology. A few scattered pulmonary nodules measure 5 mm or less in size, unchanged. Small right pleural effusion and trace left pleural effusion with dependent atelectasis in the lower lobes. No pleural fluid. Airway is unremarkable. Musculoskeletal: Degenerative changes in the spine. Increased bone density is in keeping with renal osteodystrophy. CT ABDOMEN PELVIS FINDINGS Hepatobiliary: Probable small cyst in the inferior right hepatic lobe. No specific follow-up necessary. Gallbladder is minimally prominent and the wall appears slightly thickened, unchanged from 07/24/2022. No biliary ductal dilatation. Pancreas: Negative. Spleen: Negative. Adrenals/Urinary Tract: Adrenal glands are unremarkable. Numerous low-attenuation lesions in the kidneys. No specific follow-up necessary. Bilateral renal stones. Ureters are decompressed. Bladder is very low in volume. Stomach/Bowel: Similar chronic distal gastric wall thickening. Stomach, small bowel and appendix are otherwise grossly unremarkable. Slight thickening of the wall of the ascending colon, new from 03/31/2023. Colon is otherwise grossly unremarkable. Vascular/Lymphatic: Atherosclerotic calcification of the aorta. No pathologically enlarged lymph nodes. Reproductive: Uterus is visualized.  No adnexal mass. Other: Small ascites.  Diffuse body wall edema. Musculoskeletal: Degenerative changes in the spine. Increased bone density, indicative of renal osteodystrophy. L3-4 posterior lumbar interbody fusion. Old L2 compression fracture. IMPRESSION: 1. Slight  ascending colonic wall thickening, new from 03/31/2023. Difficult to exclude colitis. 2. Tiny right and trace left pleural effusions with minimal dependent atelectasis in both lower lobes. 3. Small ascites. 4. Debris and air in a dilated esophagus, indicative of dysmotility. 5. Small pulmonary nodules, stable from 07/24/2022. Recommend attention on follow-up. 6. Bilateral renal stones. 7.  Aortic atherosclerosis (ICD10-I70.0). Three-vessel coronary artery calcification. 8. Enlarged pulmonic trunk, indicative of pulmonary arterial hypertension. Electronically Signed   By: Leanna Battles M.D.   On: 04/20/2023 16:30   LOS: 0 days   Total time spent in review of labs and imaging, patient evaluation, formulation of plan, documentation and communication with family: 35 minutes  Lanae Boast, MD  [facility]  04/22/2023, 11:25 AM

## 2023-04-22 NOTE — Consult Note (Signed)
 [facility]    Date of Admission:  04/20/2023   Total days of inpatient antibiotics 2        Reason for Consult: Leukocytosis   Principal Problem:   Hypotension Active Problems:   Non-insulin dependent type 2 diabetes mellitus (HCC)   Essential hypertension   COPD (chronic obstructive pulmonary disease) (HCC)   Tobacco use   GI bleeding   Coronary artery disease involving coronary bypass graft of native heart with angina pectoris (HCC)   Paroxysmal atrial fibrillation (HCC)   Acute on chronic respiratory failure with hypoxia (HCC)   Thrombocytopenia (HCC)   ESRD on dialysis (HCC)   Cirrhosis of liver [facility])   Assessment: 62 year old female with ESRD on HD VF, chronic hypoxic respiratory failure on 2 L O2, diabetes mellitus, aortic stenosis status post TAVR in 2024, A-fib on aspirin admitted with weakness and hypotension continues to have [place] ptosis ID engaged #Leukocytosis eosinophilia #Aortic stenosis status post TAVR - WBC around 27K since admission.  Patient has not been on antibiotics.  CT showed slight ascending colonic thickening cannot exclude colitis trace pleural effusions, small pulmonary nodule stable from 07/24/2022, bilateral renal stones ID engaged as etiology of leukocytosis unclear..  Patient denies diarrhea.  She denies dysuria. - TTE showed no vegetations. - Patient remains afebrile.  We added differentials to CBC which shows eosinophilia  Recommendations:  -Hold antibiotics - TEE - 23K eosinophilia, ANC 1.6K.  No rash.  Engaged hematology so for noninfectious causes Reviewed notes with pharmacy, no drug-induced causes do not feel that identified  Evaluation of this patient requires complex antimicrobial therapy evaluation and counseling + isolation needs for disease transmission risk assessment and mitigation   Microbiology:   Antibiotics: N/A  Cultures: Blood 3/10 Urine  Other   HPI: Jodi Bell is a 63  y.o. female with past medical history of CAD status post CABG, ESRD on HD, chronic hypoxic respite failure on 2 L O2 at baseline, diabetes mellitus, hypertension, chronic thrombocytopenia, aortic stenosis status post TAVR on 12/2022, A-fib on ASA, LBBB, generalized anxiety disorder, history of GIB presented with generalized weakness and hypotension.  She was unable to finish hemodialysis session.  On arrival patient was afebrile although WBC 13.8 K continues to be elevated around 27K.CT chest abdomen pelvis showed slight ascending colonic thickening cannot exclude colitis trace pleural effusions, small pulmonary nodule stable from 07/24/2022, bilateral renal stones ID engaged as etiology of leukocytosis unclear.   Review of Systems: Review of Systems  All other systems reviewed and are negative.   Past Medical History:  Diagnosis Date   Anemia of chronic disease    Anxiety    Arthritis    oa and psoriatic ra   Asthma    Back pain, chronic    lower back   Chronic insomnia    COPD (chronic obstructive pulmonary disease) (HCC)    Diabetes mellitus    type 2   ESRD on dialysis [facility])    Essential hypertension    GERD (gastroesophageal reflux disease)    Gout    History of hiatal hernia    Hypoalbuminemia    Hyponatremia    Mild aortic stenosis    Mitral regurgitation    Mitral stenosis    Obesity    PAD (peripheral artery disease) (HCC)    a. externial iliac calcification seen on CT 04/2020.   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 Lynnette Caffey & Laneta Simmers.   Sleep apnea    Tobacco abuse    Upper GI bleed 05/2019    Social History   Tobacco Use   Smoking status: Former    Current packs/day: 0.00    Average packs/day: 0.5 packs/day for 40.0 years (20.0 ttl pk-yrs)    Types: Cigarettes    Start date: 08/1980    Quit date: 08/2020    Years since quitting: 2.6   Smokeless tobacco: Never  Vaping Use    Vaping status: Never Used  Substance Use Topics   Alcohol use: Yes    Comment: occ   Drug use: No    Family History  Problem Relation Age of Onset   Lung cancer Mother    Diabetes Sister    Heart disease Sister    Asthma Daughter    Arthritis Daughter    Arthritis Daughter    Thyroid cancer Other        1/2 sister   Heart disease Other        1/2 sister   Scheduled Meds:  arformoterol  15 mcg Nebulization BID   And   umeclidinium bromide  1 puff Inhalation Daily   busPIRone  5 mg Oral TID   [START ON 04/23/2023] Chlorhexidine Gluconate Cloth  6 each Topical Q0600   [START ON 04/24/2023] cinacalcet  60 mg Oral Once per day on Monday Friday   And   cinacalcet  90 mg Oral Every Wednesday   dextrose       dextrose       pantoprazole  40 mg Oral BID   sodium chloride flush  3 mL Intravenous Q12H   Continuous Infusions:  dextrose 30 mL/hr at 04/22/23 1225   PRN Meds:.acetaminophen **OR** acetaminophen, albuterol, dextrose, dextrose, guaiFENesin, polyvinyl alcohol, zolpidem Allergies  Allergen Reactions   3-Methyl-2-Benzothiazolinone Hydrazone Other (See Comments)    Muscle weakness muscle cramping in legs Other reaction(s): Myalgias (Muscle Pain)   Banana Anaphylaxis, Swelling and Other (See Comments)    TONGUE AND MOUTH SWELLING   Black Buyer, retail (Non-Screening) Anaphylaxis and Itching    Walnuts   Hazelnut ([place]) Anaphylaxis    Hazelnuts   Leflunomide And Related Other (See Comments)    Severe Headache   Lisinopril Swelling    Angioedema  Swelling of the face   No [employer] Food Allergies Anaphylaxis    Spicy Mustard 4.7.2021 Pt reports that she eats Regular Yellow Mustard Swelling and itching of tongue   Other Anaphylaxis, Swelling and Other (See Comments)    Cosentyx= angioedema Mouth itches and tongue swelling Hazel nuts and pecans also Walnuts Renal failure Serotonin syndrome  Tremors Muscle weakness muscle cramping in legs Other  reaction(s): Myalgias (Muscle Pain)   Pecan Nut (Diagnostic) Anaphylaxis and Itching    Pecans   Trazodone And Nefazodone Other (See Comments)    Serotonin Syndrome  Tremors   Adalimumab Other (See Comments)    Blisters on abdomen =humira   Escitalopram Oxalate Other (See Comments)    Hand problems - Serotonin Syndrome     Ezetimibe Other (See Comments)    myalgias cramps    Secukinumab Swelling    Cosentyx = angiodema    Statins Other (See Comments)    Leg pains and weakness   Ferrlecit [Na Ferric Gluc Cplx In Sucrose]     Not reaction given from HD   Gabapentin Other (See Comments)    tremors  Nsaids     Avoid due to renal problems     OBJECTIVE: Blood pressure (!) 185/79, pulse 76, temperature 97.8 F (36.6 C), resp. rate 18, height 5\' 2"  (1.575 m), weight 55 kg, SpO2 99%.  Physical Exam Constitutional:      Appearance: Normal appearance.  HENT:     Head: Normocephalic and atraumatic.     Right Ear: Tympanic membrane normal.     Left Ear: Tympanic membrane normal.     Nose: Nose normal.     Mouth/Throat:     Mouth: Mucous membranes are moist.  Eyes:     Extraocular Movements: Extraocular movements intact.     Conjunctiva/sclera: Conjunctivae normal.     Pupils: Pupils are equal, round, and reactive to light.  Cardiovascular:     Rate and Rhythm: Normal rate and regular rhythm.     Heart sounds: No murmur heard.    No friction rub. No gallop.  Pulmonary:     Effort: Pulmonary effort is normal.     Breath sounds: Normal breath sounds.  Abdominal:     General: Abdomen is flat.     Palpations: Abdomen is soft.  Musculoskeletal:        General: Normal range of motion.  Skin:    General: Skin is warm and dry.  Neurological:     General: No focal deficit present.     Mental Status: She is alert and oriented to person, place, and time.  Psychiatric:        Mood and Affect: Mood normal.     Lab Results Lab Results  Component Value Date   WBC 27.8 (H)  04/22/2023   HGB 11.9 (L) 04/22/2023   HCT 38.0 04/22/2023   MCV 96.2 04/22/2023   PLT 53 (L) 04/22/2023    Lab Results  Component Value Date   CREATININE 8.16 (H) 04/22/2023   BUN 20 04/22/2023   NA 131 (L) 04/22/2023   K 3.9 04/22/2023   CL 98 04/22/2023   CO2 24 04/22/2023    Lab Results  Component Value Date   ALT 8 04/21/2023   AST 15 04/21/2023   ALKPHOS 70 04/21/2023   BILITOT 0.4 04/21/2023       Danelle Earthly, MD [facility] 04/22/2023, 2:26 PM

## 2023-04-23 ENCOUNTER — Observation Stay (HOSPITAL_COMMUNITY)

## 2023-04-23 DIAGNOSIS — Z992 Dependence on renal dialysis: Secondary | ICD-10-CM | POA: Diagnosis not present

## 2023-04-23 DIAGNOSIS — D721 Eosinophilia, unspecified: Secondary | ICD-10-CM

## 2023-04-23 DIAGNOSIS — R531 Weakness: Secondary | ICD-10-CM

## 2023-04-23 DIAGNOSIS — R109 Unspecified abdominal pain: Secondary | ICD-10-CM

## 2023-04-23 DIAGNOSIS — N186 End stage renal disease: Secondary | ICD-10-CM

## 2023-04-23 LAB — CBC WITH DIFFERENTIAL/PLATELET
Abs Immature Granulocytes: 0.07 10*3/uL (ref 0.00–0.07)
Basophils Absolute: 0.2 10*3/uL — ABNORMAL HIGH (ref 0.0–0.1)
Basophils Relative: 1 %
Eosinophils Absolute: 24.7 10*3/uL — ABNORMAL HIGH (ref 0.0–0.5)
Eosinophils Relative: 75 %
HCT: 38.1 % (ref 36.0–46.0)
Hemoglobin: 11.9 g/dL — ABNORMAL LOW (ref 12.0–15.0)
Immature Granulocytes: 0 %
Lymphocytes Relative: 6 %
Lymphs Abs: 1.9 10*3/uL (ref 0.7–4.0)
MCH: 30.1 pg (ref 26.0–34.0)
MCHC: 31.2 g/dL (ref 30.0–36.0)
MCV: 96.2 fL (ref 80.0–100.0)
Monocytes Absolute: 0.8 10*3/uL (ref 0.1–1.0)
Monocytes Relative: 2 %
Neutro Abs: 5.3 10*3/uL (ref 1.7–7.7)
Neutrophils Relative %: 16 %
Platelets: 46 10*3/uL — ABNORMAL LOW (ref 150–400)
RBC: 3.96 MIL/uL (ref 3.87–5.11)
RDW: 19.8 % — ABNORMAL HIGH (ref 11.5–15.5)
WBC: 33 10*3/uL — ABNORMAL HIGH (ref 4.0–10.5)
nRBC: 0 % (ref 0.0–0.2)

## 2023-04-23 LAB — CBC
HCT: 39.6 % (ref 36.0–46.0)
Hemoglobin: 12.2 g/dL (ref 12.0–15.0)
MCH: 29.4 pg (ref 26.0–34.0)
MCHC: 30.8 g/dL (ref 30.0–36.0)
MCV: 95.4 fL (ref 80.0–100.0)
Platelets: 46 10*3/uL — ABNORMAL LOW (ref 150–400)
RBC: 4.15 MIL/uL (ref 3.87–5.11)
RDW: 19.5 % — ABNORMAL HIGH (ref 11.5–15.5)
WBC: 20.6 10*3/uL — ABNORMAL HIGH (ref 4.0–10.5)
nRBC: 0.1 % (ref 0.0–0.2)

## 2023-04-23 LAB — BASIC METABOLIC PANEL
Anion gap: 10 (ref 5–15)
BUN: 13 mg/dL (ref 8–23)
CO2: 26 mmol/L (ref 22–32)
Calcium: 8.3 mg/dL — ABNORMAL LOW (ref 8.9–10.3)
Chloride: 96 mmol/L — ABNORMAL LOW (ref 98–111)
Creatinine, Ser: 6.19 mg/dL — ABNORMAL HIGH (ref 0.44–1.00)
GFR, Estimated: 7 mL/min — ABNORMAL LOW (ref >60)
Glucose, Bld: 64 mg/dL — ABNORMAL LOW (ref 70–99)
Potassium: 3.4 mmol/L — ABNORMAL LOW (ref 3.5–5.1)
Sodium: 132 mmol/L — ABNORMAL LOW (ref 135–145)

## 2023-04-23 LAB — GLUCOSE, CAPILLARY
Glucose-Capillary: 67 mg/dL — ABNORMAL LOW (ref 70–99)
Glucose-Capillary: 54 mg/dL — ABNORMAL LOW (ref 70–99)
Glucose-Capillary: 124 mg/dL — ABNORMAL HIGH (ref 70–99)
Glucose-Capillary: 76 mg/dL (ref 70–99)

## 2023-04-23 LAB — PATHOLOGIST SMEAR REVIEW

## 2023-04-23 LAB — HEPATITIS B SURFACE ANTIGEN: Hepatitis B Surface Ag: NONREACTIVE

## 2023-04-23 LAB — VITAMIN B12: Vitamin B-12: 2790 pg/mL — ABNORMAL HIGH (ref 180–914)

## 2023-04-23 MED ORDER — DEXTROSE 5 % IV SOLN: INTRAVENOUS | Status: DC

## 2023-04-23 MED ORDER — DEXTROSE 50 % IV SOLN
50.0000 mL | INTRAVENOUS | Status: AC
  Administered 2023-04-23: 50 mL via INTRAVENOUS
  Filled 2023-04-23: qty 50

## 2023-04-23 MED ORDER — HYDROMORPHONE HCL 1 MG/ML IJ SOLN
0.5000 mg | Freq: Once | INTRAMUSCULAR | Status: AC
  Administered 2023-04-23: 0.5 mg via INTRAVENOUS
  Filled 2023-04-23: qty 0.5

## 2023-04-23 MED ORDER — IOHEXOL 350 MG/ML SOLN
80.0000 mL | Freq: Once | INTRAVENOUS | Status: AC | PRN
  Administered 2023-04-23: 80 mL via INTRAVENOUS

## 2023-04-23 MED ORDER — HYDROMORPHONE HCL 1 MG/ML IJ SOLN
1.0000 mg | INTRAMUSCULAR | Status: DC | PRN
  Administered 2023-04-23 – 2023-04-24 (×4): 1 mg via INTRAVENOUS
  Filled 2023-04-23 (×4): qty 1

## 2023-04-23 MED ORDER — HYDROMORPHONE HCL 1 MG/ML IJ SOLN
0.5000 mg | INTRAMUSCULAR | Status: DC | PRN
  Administered 2023-04-23 (×2): 0.5 mg via INTRAVENOUS
  Filled 2023-04-23 (×2): qty 0.5

## 2023-04-23 MED ORDER — DEXTROSE 50 % IV SOLN
25.0000 mL | INTRAVENOUS | Status: AC
  Administered 2023-04-23: 25 mL via INTRAVENOUS
  Filled 2023-04-23: qty 50

## 2023-04-23 MED ORDER — DEXTROSE 50 % IV SOLN
25.0000 mL | INTRAVENOUS | Status: DC
Start: 2023-04-23 — End: 2023-04-23

## 2023-04-23 NOTE — Progress Notes (Signed)
 [facility] Progress Note   Subjective:  Dialysis yesterday - felt like she was having "panic attack" and was nauseated during treatment  Continued abdominal pain with nausea/vomiting this morning  Denies chest pain, sob.   Objective Vitals:   04/22/23 2000 04/23/23 0422 04/23/23 0500 04/23/23 0730  BP: (!) 144/71  (!) 159/75 (!) 142/77  Pulse: 77  84 88  Resp: (!) 23  18   Temp: 97.9 F (36.6 C)  98.2 F (36.8 C) 98.2 F (36.8 C)  TempSrc: Oral  Oral Axillary  SpO2: 94%  95% 100%  Weight:  67.2 kg    Height:        Additional Objective Labs: Basic Metabolic Panel: Recent Labs  Lab 04/21/23 0450 04/22/23 0546 04/23/23 0204  NA 131* 131* 132*  K 3.2* 3.9 3.4*  CL 104 98 96*  CO2 24 24 26   GLUCOSE 80 64* 64*  BUN 15 20 13   CREATININE 7.13* 8.16* 6.19*  CALCIUM 8.2* 9.1 8.3*  PHOS 3.1  --   --    CBC: Recent Labs  Lab 04/20/23 0827 04/20/23 1010 04/21/23 0450 04/22/23 0546 04/22/23 1055 04/23/23 0204  WBC 13.8*  --  27.6* 28.0* 27.8* 20.6*  NEUTROABS  --   --   --   --  1.6*  --   HGB 13.1   < > 10.0* 11.0* 11.9* 12.2  HCT 44.6   < > 32.7* 35.7* 38.0 39.6  MCV 99.3  --  98.2 97.3 96.2 95.4  PLT 62*  --  46* 48* 53* 46*   < > = values in this interval not displayed.   Blood Culture    Component Value Date/Time   SDES BLOOD RIGHT ARM 04/20/2023 0937   SPECREQUEST  04/20/2023 1610    BOTTLES DRAWN AEROBIC AND ANAEROBIC Blood Culture results may not be optimal due to an inadequate volume of blood received in culture bottles   CULT  04/20/2023 0937    NO GROWTH 3 DAYS Performed at [facility], [address] [place], [place] [address]    REPTSTATUS PENDING 04/20/2023 4098    Physical Exam General: Alert, nad, on nasal oxygen Heart: RRR Lungs: Diminished  Abdomen: soft no masses  Extremities: Trace LE edema  Dialysis Access: LUF AVF +bruit   Medications:  dextrose 30 mL/hr at 04/22/23 1225    arformoterol  15 mcg  Nebulization BID   And   umeclidinium bromide  1 puff Inhalation Daily   busPIRone  5 mg Oral TID   Chlorhexidine Gluconate Cloth  6 each Topical Q0600   [START ON 04/24/2023] cinacalcet  60 mg Oral Once per day on Monday Friday   And   cinacalcet  90 mg Oral Every Wednesday   pantoprazole (PROTONIX) IV  40 mg Intravenous Q12H   sodium chloride flush  3 mL Intravenous Q12H    Dialysis Orders: [facility] MWF (will call for records)   Assessment/Plan: 1. Hypotension - History of being on multiple blood pressure medications. Possibly overtreated. Holding blood pressure medications for now. BP back up.  2. ESRD - HD MWF. Continue on schedule. HD Friday  3. Hypokalemia - Improved.  4. Volume- UF limited by hypotension. Tolerates 1-2L UF.  5. Anemia- Hgb  11-12. No ESA indication.   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 CAD s/p CAGB, Hx TAVR - 8. Leukocytosis - Blood Cx NGTD. Per primary. ID consulted.  9. Thrombocytopenia/Hx liver cirrhosis: Chronic issue. Per  primary  10.  COPD/Chronic respiratory failure  - 2-3L [place] at baseline per patient   Tomasa Blase PA-C [facility] 04/23/2023,10:49 AM

## 2023-04-23 NOTE — Consult Note (Addendum)
 [facility]  [phone] [phone]    HEMATOLOGY CONSULTATION  PURPOSE OF CONSULTATION/CHIEF COMPLAINT:  Referring MD:  Dr. Benjamine Mola   HPI: Jodi Bell is a 63 year old female patient who was brought to the ED because of hypotension and weakness during hemodialysis session. Workup was done in the ED including CBC with differential which showed elevated eosinophils.  Therefore hematology consult has been requested. Patient seen, assessed and examined today.  Family members at bedside. She is awake alert and oriented x 3.  Relates she began to have poor appetite with nausea and generalized weakness for several days.  Patient had dental work done recently and states that caused her to not be able to eat and has had significant weight loss.  Also complains of diarrhea with bloody stool noted.  Medical history is significant for anemia of chronic disease, thrombocytopenia, recurrent GI bleed, end-stage renal disease on hemodialysis 3 times per week, diabetes, hypertension, chronic hypoxic respiratory failure and CABG x 2. Of note, patient had recent teeth extraction last week and has had poor food intake since then. Surgical history includes lower back surgery in 2016 and knee surgery in 2019.  She is status post right heart cath in 2024. Family oncologic history includes mother with lung cancer and a half sister with thyroid cancer. Social history includes tobacco use, started smoking at age 56 and reports she quit in 2022.  Admits to social alcohol use.  Denies illicit drug use.  Lives at home with daughter. Worked as a Engineer, civil (consulting), retired.    ASSESSMENT AND PLAN:  Eosinophilia - CBC with differential done 04/22/2023 shows elevated absolute eosinophils 23.4 - CBC done 1 month ago 04/01/2023 with slightly elevated absolute eos 0.7.  Also noted slight elevation 1.1 earlier in February 2025. - May be due to parasitic infection, inflammation, or bone marrow  disorder - Blood cultures drawn 04/20/2023 with no growth to date. - Pathology smear review and progress - CT abdomen pelvis done 04/23/2023.  Results pending. -- Will order hypereosinophilia workup to include tryptase, vitamin B12, MPN panel, IgE and immunoglobulin studies and hypereosinophilia panel.  Of note the genetic testing with the MPN panel and hypereosinophilia panel will take 7-10 business days to return. --reassuringly the levels appear to be subsiding without intervention. If persistently elevated or worsening would consider 1 mg/kg prednisone to help lower the eosinophilia.  - Agree with ID consult - Hematology/Dr. Leonides Schanz following  Thrombocytopenia History of cirrhosis - Platelets low 46K today.  Platelets noted to be dropping since mid February 96K. - May be due to cirrhosis, medications. - Hep b neg. - Transfuse platelets for counts <20 K or <50 K with active bleeding.  No active bleeding noted. - Continue to monitor CBC with differential  Anemia, normocytic History of recurrent GI bleed - Mild - Likely due to chronic disease/ESRD - Hemoglobin stable 12.2 today.  Noted low hemoglobin 8.7 on 04/01/2023. - No transfusional intervention at this time required. - Continue to monitor CBC with differential  ESRD - On hemodialysis 3 times per week (MWF) -Per nephrology management  Diarrhea - Complains of diarrhea with bloody stool.  States she has not had a bowel movement today. - Recommend FOBT - Several positive FOBT's noted prior admissions, most recent 01/02/2023.  Hypertension Diabetes - Monitor BP levels - Continue antihypertensives as ordered - Monitor blood sugar levels   Past Medical History:  Diagnosis Date   Anemia of chronic disease    Anxiety  Arthritis    oa and psoriatic ra   Asthma    Back pain, chronic    lower back   Chronic insomnia    COPD (chronic obstructive pulmonary disease) (HCC)    Diabetes mellitus    type 2   ESRD on dialysis  [facility])    Essential hypertension    GERD (gastroesophageal reflux disease)    Gout    History of hiatal hernia    Hypoalbuminemia    Hyponatremia    Mild aortic stenosis    Mitral regurgitation    Mitral stenosis    Obesity    PAD (peripheral artery disease) (HCC)    a. externial iliac calcification seen on CT 04/2020.   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 Lynnette Caffey & Laneta Simmers.   Sleep apnea    Tobacco abuse    Upper GI bleed 05/2019  :  Past Surgical History:  Procedure Laterality Date   BACK SURGERY  2016   lower back fusion with cage   BIOPSY  09/23/2017   Procedure: BIOPSY;  Surgeon: Graylin Shiver, MD;  Location: [facility] ENDOSCOPY;  Service: Endoscopy;;   BIOPSY  05/19/2019   Procedure: BIOPSY;  Surgeon: Kathi Der, MD;  Location: [facility] ENDOSCOPY;  Service: Gastroenterology;;   CESAREAN SECTION  1990   x 1    COLONOSCOPY WITH PROPOFOL N/A 09/23/2017   Procedure: COLONOSCOPY WITH PROPOFOL Hemostatic clips placed;  Surgeon: Graylin Shiver, MD;  Location: [facility] ENDOSCOPY;  Service: Endoscopy;  Laterality: N/A;   COLONOSCOPY WITH PROPOFOL N/A 10/02/2020   Procedure: COLONOSCOPY WITH PROPOFOL;  Surgeon: Willis Modena, MD;  Location: [facility] ENDOSCOPY;  Service: Endoscopy;  Laterality: N/A;   CORONARY ARTERY BYPASS GRAFT N/A 09/13/2020   Procedure: CORONARY ARTERY BYPASS GRAFTING (CABG), ON PUMP, TIMES TWO, USING LEFT INTERNAL MAMMARY ARTERY AND RIGHT ENDOSCOPICALLY HARVESTED GREATER SAPHENOUS VEIN;  Surgeon: Alleen Borne, MD;  Location: [facility] OR;  Service: Open Heart Surgery;  Laterality: N/A;   DILATION AND CURETTAGE OF UTERUS  1988   ENTEROSCOPY N/A 07/22/2021   Procedure: ENTEROSCOPY;  Surgeon: Kerin Salen, MD;  Location: [facility] ENDOSCOPY;  Service: Gastroenterology;  Laterality: N/A;   ENTEROSCOPY N/A 01/28/2023   Procedure: ENTEROSCOPY;  Surgeon: [name], MD;  Location: [facility] ENDOSCOPY;  Service:  Gastroenterology;  Laterality: N/A;   ESOPHAGOGASTRODUODENOSCOPY N/A 09/22/2020   Procedure: ESOPHAGOGASTRODUODENOSCOPY (EGD);  Surgeon: Willis Modena, MD;  Location: [facility] ENDOSCOPY;  Service: Endoscopy;  Laterality: N/A;   ESOPHAGOGASTRODUODENOSCOPY (EGD) WITH PROPOFOL N/A 09/23/2017   Procedure: ESOPHAGOGASTRODUODENOSCOPY (EGD) WITH PROPOFOL;  Surgeon: Graylin Shiver, MD;  Location: [facility] ENDOS[facility];  Service: Endoscopy;  Laterality: N/A;   ESOPHAGOGASTRODUODENOSCOPY (EGD) WITH PROPOFOL N/A 05/19/2019   Procedure: ESOPHAGOGASTRODUODENOSCOPY (EGD) WITH PROPOFOL;  Surgeon: Kathi Der, MD;  [facility];  Service: Gastroenterology;  Laterality: N/A;   ESOPHAGOGASTRODUODENOSCOPY (EGD) WITH PROPOFOL N/A 10/02/2020   Procedure: ESOPHAGOGASTRODUODENOSCOPY (EGD) WITH PROPOFOL;  Surgeon: Willis Modena, MD;  Location: [facility] ENDOSCOPY;  Service: Endoscopy;  Laterality: N/A;   GIVENS CAPSULE STUDY N/A 05/19/2019   Procedure: GIVENS CAPSULE STUDY;  Surgeon: Kathi Der, MD;  Location: [facility] ENDOSCOPY;  Service: Gastroenterology;  Laterality: N/A;   GIVENS CAPSULE STUDY N/A 07/19/2021   Procedure: GIVENS CAPSULE STUDY;  Surgeon: [name], MD;  Location: [facility] ENDOSCOPY;  Service: Gastroenterology;  Laterality: N/A;   HEMOSTASIS CLIP PLACEMENT  09/22/2020   Procedure: HEMOSTASIS CLIP PLACEMENT;  Surgeon: Willis Modena, MD;  [facility] ENDOS[facility];  Service: Endoscopy;;   HEMOSTASIS CONTROL  09/22/2020   Procedure: HEMOSTASIS CONTROL;  Surgeon: Willis Modena, MD;  [facility]: [facility]OS[facility];  Service: Endoscopy;;   HOT HEMOSTASIS N/A 05/19/2019   Procedure: HOT HEMOSTASIS (ARGON PLASMA COAGULATION/BICAP);  Surgeon: Kathi Der, MD;  Location: [facility] ENDOSCOPY;  Service: Gastroenterology;  Laterality: N/A;   HOT HEMOSTASIS N/A 01/28/2023   Procedure: HOT HEMOSTASIS (ARGON PLASMA COAGULATION/BICAP);  Surgeon: [name], MD;  Location: [facility] ENDOSCOPY;  Service: Gastroenterology;  Laterality: N/A;    INTRAOPERATIVE TRANSTHORACIC ECHOCARDIOGRAM N/A 12/23/2022   Procedure: INTRAOPERATIVE TRANSTHORACIC ECHOCARDIOGRAM;  Surgeon: [name], MD;  [facility] INVASIVE CV LAB;  Service: Cardiovascular;  Laterality: N/A;   LEFT HEART CATH AND CORONARY ANGIOGRAPHY N/A 09/10/2020   Procedure: LEFT HEART CATH AND CORONARY ANGIOGRAPHY;  Surgeon: Yvonne Kendall, MD;  [facility] INVASIVE CV LAB;  Service: Cardiovascular;  Laterality: N/A;   PARTIAL KNEE ARTHROPLASTY Left 11/12/2017   Procedure: left unicompartmental arthroplasty-medial;  Surgeon: Durene Romans, MD;  Location: [facility] ORS;  Service: Orthopedics;  Laterality: Left;    RIGHT HEART CATH AND CORONARY/GRAFT ANGIOGRAPHY N/A 06/26/2022   Procedure: RIGHT HEART CATH AND CORONARY/GRAFT ANGIOGRAPHY;  Surgeon: [name], MD;  [facility] INVASIVE CV LAB;  Service: Cardiovascular;  Laterality: N/A;   SUBMUCOSAL INJECTION  09/23/2017   Procedure: SUBMUCOSAL INJECTION;  Surgeon: Graylin Shiver, MD;  Location: [facility] ENDOSCOPY;  Service: Endoscopy;;  in colon   TEE WITHOUT CARDIOVERSION N/A 09/13/2020   Procedure: TRANSESOPHAGEAL ECHOCARDIOGRAM (TEE);  Surgeon: Alleen Borne, MD;  Location: [facility] OR;  Service: Open Heart Surgery;  Laterality: N/A;   TUBAL LIGATION  1990  :  Allergies  Allergen Reactions   3-Methyl-2-Benzothiazolinone Hydrazone Other (See Comments)    Muscle weakness muscle cramping in legs Other reaction(s): Myalgias (Muscle Pain)   Banana Anaphylaxis, Swelling and Other (See Comments)    TONGUE AND MOUTH SWELLING   Black Buyer, retail (Non-Screening) Anaphylaxis and Itching    Walnuts   Hazelnut (Filbert) Anaphylaxis    Hazelnuts   Leflunomide And Related Other (See Comments)    Severe Headache   Lisinopril Swelling    Angioedema  Swelling of the face   No Healthtouch Food Allergies Anaphylaxis    Spicy Mustard 4.7.2021 Pt reports that she eats Regular Yellow Mustard Swelling and itching of tongue   Other  Anaphylaxis, Swelling and Other (See Comments)    Cosentyx= angioedema Mouth itches and tongue swelling Hazel nuts and pecans also Walnuts Renal failure Serotonin syndrome  Tremors Muscle weakness muscle cramping in legs Other reaction(s): Myalgias (Muscle Pain)   Pecan Nut (Diagnostic) Anaphylaxis and Itching    Pecans   Trazodone And Nefazodone Other (See Comments)    Serotonin Syndrome  Tremors   Adalimumab Other (See Comments)    Blisters on abdomen =humira   Escitalopram Oxalate Other (See Comments)    Hand problems - Serotonin Syndrome     Ezetimibe Other (See Comments)    myalgias cramps    Secukinumab Swelling    Cosentyx = angiodema    Statins Other (See Comments)    Leg pains and weakness   Ferrlecit [Na Ferric Gluc Cplx In Sucrose]     Not reaction given from HD   Gabapentin Other (See Comments)    tremors   Nsaids     Avoid due to renal problems   :   Family History  Problem Relation Age of Onset   Lung cancer Mother  Diabetes Sister    Heart disease Sister    Asthma Daughter    Arthritis Daughter    Arthritis Daughter    Thyroid cancer Other        1/2 sister   Heart disease Other        1/2 sister  :   Social History   Socioeconomic History   Marital status: Divorced    Spouse name: BASIL   Number of children: 3   Years of education: 1   Highest education level: GED or equivalent  Occupational History   Occupation: retired Public house manager  Tobacco Use   Smoking status: Former    Current packs/day: 0.00    Average packs/day: 0.5 packs/day for 40.0 years (20.0 ttl pk-yrs)    Types: Cigarettes    Start date: 08/1980    Quit date: 08/2020    Years since quitting: 2.7   Smokeless tobacco: Never  Vaping Use   Vaping status: Never Used  Substance and Sexual Activity   Alcohol use: Yes    Comment: occ   Drug use: No   Sexual activity: Not on file  Other Topics Concern   Not on file  Social History Narrative   Not on file   Social  Drivers of Health   Financial Resource Strain: Low Risk  (02/26/2023)   Received from [facility]   Overall Financial Resource Strain (CARDIA)    Difficulty of Paying Living Expenses: Not hard at all  Food Insecurity: No Food Insecurity (04/20/2023)   Hunger Vital Sign    Worried About Running Out of Food in the Last Year: Never true    Ran Out of Food in the Last Year: Never true  Transportation Needs: No Transportation Needs (04/20/2023)   PRAPARE - Administrator, Civil Service (Medical): No    Lack of Transportation (Non-Medical): No  Recent Concern: Transportation Needs - Unmet Transportation Needs (02/26/2023)   Received from [facility] - Transportation    Lack of Transportation (Medical): Yes    Lack of Transportation (Non-Medical): Yes  Physical Activity: Unknown (02/26/2023)   Received from [facility]   Exercise Vital Sign    Days of Exercise per Week: 0 days    Minutes of Exercise per Session: Not on file  Stress: No Stress Concern Present (02/26/2023)   Received from [facility] of Occupational [facility] - Occupational Stress Questionnaire    Feeling of Stress : Not at all  Social Connections: Socially Isolated (04/20/2023)   Social Connection and Isolation Panel [NHANES]    Frequency of Communication with Friends and Family: More than three times a week    Frequency of Social Gatherings with Friends and Family: Three times a week    Attends Religious Services: Never    Active Member of Clubs or Organizations: No    Attends Banker Meetings: Never    Marital Status: Divorced  Catering manager Violence: Not At Risk (04/20/2023)   Humiliation, Afraid, Rape, and Kick questionnaire    Fear of Current or Ex-Partner: No    Emotionally Abused: No    Physically Abused: No    Sexually Abused: No  :   CURRENT MEDS: Current Facility-Administered Medications  Medication Dose Route Frequency Provider Last Rate Last  Admin   acetaminophen (TYLENOL) tablet 650 mg  650 mg Oral Q6H PRN Gertha Calkin, MD   650 mg at 04/22/23 2119   Or   acetaminophen (TYLENOL) suppository  650 mg  650 mg Rectal Q6H PRN Gertha Calkin, MD       albuterol (PROVENTIL) (2.5 MG/3ML) 0.083% nebulizer solution 3 mL  3 mL Inhalation Q6H PRN Gertha Calkin, MD   3 mL at 04/21/23 1608   alum & mag hydroxide-simeth (MAALOX/MYLANTA) 200-200-20 MG/5ML suspension 30 mL  30 mL Oral Q6H PRN Anthoney Harada, NP   30 mL at 04/23/23 0003   arformoterol (BROVANA) nebulizer solution 15 mcg  15 mcg Nebulization BID Gertha Calkin, MD   15 mcg at 04/22/23 2023   And   umeclidinium bromide (INCRUSE ELLIPTA) 62.5 MCG/ACT 1 puff  1 puff Inhalation Daily Gertha Calkin, MD   1 puff at 04/21/23 1028   busPIRone (BUSPAR) tablet 5 mg  5 mg Oral TID Gertha Calkin, MD   5 mg at 04/23/23 1610   Chlorhexidine Gluconate Cloth 2 % PADS 6 each  6 each Topical Q0600 Tomasa Blase, PA-C   6 each at 04/23/23 0505   [START ON 04/24/2023] cinacalcet (SENSIPAR) tablet 60 mg  60 mg Oral Once per day on Monday Friday Gertha Calkin, MD       And   cinacalcet [facility]) tablet 90 mg  90 mg Oral Every Wednesday Gertha Calkin, MD   90 mg at 04/22/23 1210   dextrose 5 % solution   Intravenous Continuous Vann, Jessica U, DO       dextrose 50 % solution 50 mL  50 mL Intravenous STAT Vann, Jessica U, DO       guaiFENesin (ROBITUSSIN) 100 MG/5ML liquid 5 mL  5 mL Oral Q4H PRN Kc, Dayna Barker, MD   5 mL at 04/22/23 0752   HYDROmorphone (DILAUDID) injection 0.5 mg  0.5 mg Intravenous Q4H PRN Marlin Canary U, DO   0.5 mg at 04/23/23 1454   pantoprazole (PROTONIX) injection 40 mg  40 mg Intravenous Q12H Anthoney Harada, NP   40 mg at 04/23/23 9604   polyvinyl alcohol (LIQUIFILM TEARS) 1.4 % ophthalmic solution 1 drop  1 drop Both Eyes PRN Lanae Boast, MD   1 drop at 04/22/23 0752   sodium chloride flush (NS) 0.9 % injection 3 mL  3 mL Intravenous Q12H Gertha Calkin, MD   3 mL at 04/23/23  0842   zolpidem (AMBIEN) tablet 5 mg  5 mg Oral QHS PRN Lanae Boast, MD   5 mg at 04/21/23 2159    REVIEW OF SYSTEMS:   Constitutional: +fatigue, Denies fevers, chills or abnormal night sweats Eyes: Denies blurriness of vision, double vision or watery eyes Ears, nose, mouth, throat, and face: Denies mucositis or sore throat Respiratory: Denies cough, dyspnea or wheezes Cardiovascular: Denies palpitation, chest discomfort or lower extremity swelling Gastrointestinal: +diarrhea, +generalized abd pain, +nausea, Skin: +jaundiced Lymphatics: Denies new lymphadenopathy or easy bruising Neurological: Denies numbness, tingling or new weaknesses Behavioral/Psych: Mood is stable, no new changes  All other systems were reviewed with the patient and are negative.  PHYSICAL EXAMINATION: ECOG PERFORMANCE STATUS: 2 - Symptomatic, <50% confined to bed  Vitals:   04/23/23 0730 04/23/23 1336  BP: (!) 142/77 (!) 154/70  Pulse: 88 87  Resp:  18  Temp: 98.2 F (36.8 C) 98.9 F (37.2 C)  SpO2: 100% 100%   Filed Weights   04/22/23 0500 04/22/23 1908 04/23/23 0422  Weight: 121 lb 4.1 oz (55 kg) 148 lb 2.4 oz (67.2 kg) 148 lb 2.4 oz (67.2 kg)    GENERAL: alert, +generalized weakness  SKIN: +jaundiced skin color, texture, turgor are normal, no rashes or significant lesions EYES: +scleral icterus OROPHARYNX: no exudate, no erythema and lips, buccal mucosa, and tongue normal  NECK: supple, thyroid normal size, non-tender, without nodularity LYMPH: no palpable lymphadenopathy in the cervical, axillary or inguinal LUNGS: clear to auscultation and percussion with normal breathing effort HEART: regular rate & rhythm and no murmurs and no lower extremity edema ABDOMEN: abdomen soft, +tender and normal bowel sounds MUSCULOSKELETAL: no cyanosis of digits and no clubbing  PSYCH: alert & oriented x 3 with fluent speech NEURO: no focal motor/sensory deficits   LABS: Lab Results  Component Value Date    WBC 20.6 (H) 04/23/2023   HGB 12.2 04/23/2023   HCT 39.6 04/23/2023   MCV 95.4 04/23/2023   PLT 46 (L) 04/23/2023    Lab Results  Component Value Date   WBC 20.6 (H) 04/23/2023   HGB 12.2 04/23/2023   HCT 39.6 04/23/2023   PLT 46 (L) 04/23/2023   GLUCOSE 64 (L) 04/23/2023   CHOL 131 07/16/2021   TRIG 34 07/16/2021   HDL 55 07/16/2021   LDLCALC 69 07/16/2021   ALT 8 04/21/2023   AST 15 04/21/2023   NA 132 (L) 04/23/2023   K 3.4 (L) 04/23/2023   CL 96 (L) 04/23/2023   CREATININE 6.19 (H) 04/23/2023   BUN 13 04/23/2023   CO2 26 04/23/2023   INR 1.7 (H) 04/20/2023   HGBA1C 4.5 (L) 09/20/2022    ECHOCARDIOGRAM COMPLETE BUBBLE STUDY Result Date: 04/21/2023    ECHOCARDIOGRAM REPORT   Patient Name:   Jodi Bell Date of Exam: 04/21/2023 Medical Rec #:  [id number]        Height:       62.0 in Accession #:    [id number]       Weight:       122.1 lb Date of Birth:  [birth year]         BSA:          1.550 m Patient Age:    62 years         BP:           117/58 mmHg Patient Gender: F                HR:           71 bpm. Exam Location:  Inpatient Procedure: 2D Echo, 3D Echo, Cardiac Doppler, Color Doppler, Strain Analysis and            Saline Contrast Bubble Study (Both Spectral and Color Flow Doppler            were utilized during procedure). Indications:    Acute Hypoxemic Respiratory Failure  History:        Patient has prior history of Echocardiogram examinations, most                 recent 02/02/2023. Prior CABG, COPD; Risk Factors:Hypertension,                 Diabetes and Former Smoker.  Sonographer:    Karma Ganja Referring Phys: [phone] EKTA V PATEL IMPRESSIONS  1. Mildly D-shaped interventricular septum suggestive of a degree of RV pressure/volume overload. Left ventricular ejection fraction, by estimation, is 55 to 60%. The left ventricle has normal function. The left ventricle has no regional wall motion abnormalities. There is moderate concentric left ventricular hypertrophy. Left  ventricular diastolic parameters are consistent with Grade II diastolic dysfunction (pseudonormalization). The average  left ventricular global longitudinal strain is -13.7 %. The global longitudinal strain is abnormal.  2. Right ventricular systolic function is mildly reduced. The right ventricular size is mildly enlarged. There is severely elevated pulmonary artery systolic pressure. The estimated right ventricular systolic pressure is 64.6 mmHg.  3. Left atrial size was severely dilated.  4. Right atrial size was moderately dilated.  5. Bubble study is negative (a few bubbles cross very late).  6. The mitral valve is degenerative. Trivial mitral valve regurgitation. Mild mitral stenosis. The mean mitral valve gradient is 5.0 mmHg. Moderate mitral annular calcification.  7. The tricuspid valve is abnormal. Tricuspid valve regurgitation is moderate.  8. 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 Mild perivalvular leakage. Mean gradient 36 mmmHg with dimensionless index 0.38 and EOA 1.48 cm^2. LVOT VTI 33 with SV 126 mL. There does appear to be significant bioprosthetic aortic  valve stenosis, similar gradient to prior echo. However, high gradient is likely exacerbated by high flow.  9. The inferior vena cava is dilated in size with <50% respiratory variability, suggesting right atrial pressure of 15 mmHg. FINDINGS  Left Ventricle: Mildly D-shaped interventricular septum suggestive of a degree of RV pressure/volume overload. Left ventricular ejection fraction, by estimation, is 55 to 60%. The left ventricle has normal function. The left ventricle has no regional wall motion abnormalities. The average left ventricular global longitudinal strain is -13.7 %. Strain was performed and the global longitudinal strain is abnormal. The left ventricular internal cavity size was normal in size. There is moderate concentric  left ventricular hypertrophy. Left ventricular diastolic parameters are  consistent with Grade II diastolic dysfunction (pseudonormalization). Right Ventricle: The right ventricular size is mildly enlarged. No increase in right ventricular wall thickness. Right ventricular systolic function is mildly reduced. There is severely elevated pulmonary artery systolic pressure. The tricuspid regurgitant velocity is 3.52 m/s, and with an assumed right atrial pressure of 15 mmHg, the estimated right ventricular systolic pressure is 64.6 mmHg. Left Atrium: Left atrial size was severely dilated. Right Atrium: Right atrial size was moderately dilated. Pericardium: There is no evidence of pericardial effusion. Mitral Valve: The mitral valve is degenerative in appearance. There is mild calcification of the mitral valve leaflet(s). Moderate mitral annular calcification. Trivial mitral valve regurgitation. Mild mitral valve stenosis. MV peak gradient, 13.1 mmHg. The mean mitral valve gradient is 5.0 mmHg. Tricuspid Valve: The tricuspid valve is abnormal. Tricuspid valve regurgitation is moderate. 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 Mild perivalvular leakage. Mean gradient 36 mmmHg with dimensionless index 0.38 and EOA 1.48 cm^2. LVOT VTI 33 with SV 126 mL. There does appear to be significant bioprosthetic aortic valve stenosis, similar gradient to prior echo. However, high gradient is likely exacerbated by high flow. The aortic valve has been repaired/replaced. Aortic valve regurgitation is mild. Aortic valve mean gradient measures 36.0 mmHg. Aortic valve peak gradient measures 62.4 mmHg. Aortic valve area, by VTI measures 1.48 cm. Pulmonic Valve: The pulmonic valve was normal in structure. Pulmonic valve regurgitation is trivial. Aorta: The aortic root is normal in size and structure. Venous: The inferior vena cava is dilated in size with less than 50% respiratory variability, suggesting right atrial pressure of 15 mmHg. IAS/Shunts: Bubble study is negative  (a few bubbles cross very late). Agitated saline contrast was given intravenously to evaluate for intracardiac shunting.  LEFT VENTRICLE PLAX 2D LVIDd:         3.70 cm  Diastology LVIDs:         2.40 cm   LV e' medial:    5.22 cm/s LV PW:         2.40 cm   LV E/e' medial:  31.0 LV IVS:        1.50 cm   LV e' lateral:   6.53 cm/s LVOT diam:     2.20 cm   LV E/e' lateral: 24.8 LV SV:         126 LV SV Index:   81        2D Longitudinal Strain LVOT Area:     3.80 cm  2D Strain GLS Avg:     -13.7 %                           3D Volume EF:                          3D EF:        57 %                          LV EDV:       159 ml                          LV ESV:       69 ml                          LV SV:        90 ml RIGHT VENTRICLE            IVC RV Basal diam:  4.60 cm    IVC diam: 2.30 cm RV S prime:     7.51 cm/s TAPSE (M-mode): 1.4 cm LEFT ATRIUM              Index         RIGHT ATRIUM           Index LA diam:        4.50 cm  2.90 cm/m    RA Area:     24.10 cm LA Vol (A2C):   158.0 ml 101.92 ml/m  RA Volume:   71.00 ml  45.80 ml/m LA Vol (A4C):   83.5 ml  53.86 ml/m LA Biplane Vol: 117.0 ml 75.47 ml/m  AORTIC VALVE AV Area (Vmax):    1.44 cm AV Area (Vmean):   1.37 cm AV Area (VTI):     1.48 cm AV Vmax:           395.00 cm/s AV Vmean:          280.000 cm/s AV VTI:            0.853 m AV Peak Grad:      62.4 mmHg AV Mean Grad:      36.0 mmHg LVOT Vmax:         150.00 cm/s LVOT Vmean:        101.000 cm/s LVOT VTI:          0.332 m LVOT/AV VTI ratio: 0.39  AORTA Ao Root diam: 2.80 cm Ao Asc diam:  2.80 cm MITRAL VALVE                TRICUSPID VALVE MV Area (PHT): 3.48 cm  TR Peak grad:   49.6 mmHg MV Area VTI:   2.75 cm     TR Vmax:        352.00 cm/s MV Peak grad:  13.1 mmHg MV Mean grad:  5.0 mmHg     SHUNTS MV Vmax:       1.81 m/s     Systemic VTI:  0.33 m MV Vmean:      109.0 cm/s   Systemic Diam: 2.20 cm MV Decel Time: 218 msec MV E velocity: 162.00 cm/s MV A velocity: 126.00 cm/s MV E/A ratio:  1.29  Dalton McleanMD Electronically signed by Wilfred Lacy Signature Date/Time: 04/21/2023/10:56:31 AM    Final    CT CHEST ABDOMEN PELVIS WO CONTRAST Result Date: 04/20/2023 CLINICAL DATA:  Sepsis. EXAM: CT CHEST, ABDOMEN AND PELVIS WITHOUT CONTRAST TECHNIQUE: Multidetector CT imaging of the chest, abdomen and pelvis was performed following the standard protocol without IV contrast. RADIATION DOSE REDUCTION: This exam was performed according to the departmental dose-optimization program which includes automated exposure control, adjustment of the mA and/or kV according to patient size and/or use of iterative reconstruction technique. COMPARISON:  CT abdomen pelvis 03/31/2023, CT chest 07/24/2022. FINDINGS: CT CHEST FINDINGS Cardiovascular: Atherosclerotic calcification of the aorta. Aortic valve repair. Three-vessel coronary artery calcification. Enlarged pulmonic trunk and heart. No pericardial effusion. Mediastinum/Nodes: No pathologically enlarged mediastinal or axillary lymph nodes. Hilar regions are difficult to definitively evaluate without IV contrast. Debris and air in a dilated esophagus. Lungs/Pleura: Very minimal patchy ground-glass in the right upper lobe, likely infectious/inflammatory in etiology. A few scattered pulmonary nodules measure 5 mm or less in size, unchanged. Small right pleural effusion and trace left pleural effusion with dependent atelectasis in the lower lobes. No pleural fluid. Airway is unremarkable. Musculoskeletal: Degenerative changes in the spine. Increased bone density is in keeping with renal osteodystrophy. CT ABDOMEN PELVIS FINDINGS Hepatobiliary: Probable small cyst in the inferior right hepatic lobe. No specific follow-up necessary. Gallbladder is minimally prominent and the wall appears slightly thickened, unchanged from 07/24/2022. No biliary ductal dilatation. Pancreas: Negative. Spleen: Negative. Adrenals/Urinary Tract: Adrenal glands are unremarkable. Numerous  low-attenuation lesions in the kidneys. No specific follow-up necessary. Bilateral renal stones. Ureters are decompressed. Bladder is very low in volume. Stomach/Bowel: Similar chronic distal gastric wall thickening. Stomach, small bowel and appendix are otherwise grossly unremarkable. Slight thickening of the wall of the ascending colon, new from 03/31/2023. Colon is otherwise grossly unremarkable. Vascular/Lymphatic: Atherosclerotic calcification of the aorta. No pathologically enlarged lymph nodes. Reproductive: Uterus is visualized.  No adnexal mass. Other: Small ascites.  Diffuse body wall edema. Musculoskeletal: Degenerative changes in the spine. Increased bone density, indicative of renal osteodystrophy. L3-4 posterior lumbar interbody fusion. Old L2 compression fracture. IMPRESSION: 1. Slight ascending colonic wall thickening, new from 03/31/2023. Difficult to exclude colitis. 2. Tiny right and trace left pleural effusions with minimal dependent atelectasis in both lower lobes. 3. Small ascites. 4. Debris and air in a dilated esophagus, indicative of dysmotility. 5. Small pulmonary nodules, stable from 07/24/2022. Recommend attention on follow-up. 6. Bilateral renal stones. 7. Aortic atherosclerosis (ICD10-I70.0). Three-vessel coronary artery calcification. 8. Enlarged pulmonic trunk, indicative of pulmonary arterial hypertension. Electronically Signed   By: Leanna Battles M.D.   On: 04/20/2023 16:30   DG Chest Port 1 View Result Date: 04/20/2023 CLINICAL DATA:  Questionable sepsis - evaluate for abnormality EXAM: PORTABLE CHEST 1 VIEW COMPARISON:  03/28/2023. FINDINGS: Low lung volume. There are atelectatic changes/scarring at the left lung base. Bilateral lung fields  are otherwise clear. Bilateral costophrenic angles are clear. Note is made of elevated right hemidiaphragm. Stable mildly enlarged cardio-mediastinal silhouette. There are surgical staples along the heart border and sternotomy wires,  status post CABG (coronary artery bypass graft). Prosthetic aortic valve seen. No acute osseous abnormalities. Lower cervical spinal fixation hardware partially seen. The soft tissues are within normal limits. IMPRESSION: No active disease. Electronically Signed   By: Jules Schick M.D.   On: 04/20/2023 10:21   US Abdomen Limited RUQ (LIVER/GB) Result Date: 04/01/2023 CLINICAL DATA:  Right upper quadrant pain EXAM: ULTRASOUND ABDOMEN LIMITED RIGHT UPPER QUADRANT COMPARISON:  CT from the previous day. FINDINGS: Gallbladder: Gallbladder is decompressed with wall thickening. This is in part due to the decompressed state as well as underlying ascites. No cholelithiasis is seen. Common bile duct: Diameter: 1.9 mm. Liver: Increase in echogenicity consistent with fatty infiltration. Portal vein is patent on color Doppler imaging with normal direction of blood flow towards the liver. Other: None. IMPRESSION: Decompressed gallbladder with wall thickening accentuated by ascites. Correlate with the physical exam. HIDA scan may be helpful to assess for gallbladder function. Fatty liver. Electronically Signed   By: Alcide Clever M.D.   On: 04/01/2023 00:50   CT ABDOMEN PELVIS WO CONTRAST Result Date: 03/31/2023 CLINICAL DATA:  Acute left-sided abdominal pain. EXAM: CT ABDOMEN AND PELVIS WITHOUT CONTRAST TECHNIQUE: Multidetector CT imaging of the abdomen and pelvis was performed following the standard protocol without IV contrast. RADIATION DOSE REDUCTION: This exam was performed according to the departmental dose-optimization program which includes automated exposure control, adjustment of the mA and/or kV according to patient size and/or use of iterative reconstruction technique. COMPARISON:  January 02, 2023. FINDINGS: Lower chest: No acute abnormality. Hepatobiliary: Nodular hepatic contours are noted suggesting hepatic cirrhosis. Moderate gallbladder wall thickening is noted without cholelithiasis. Pancreas:  Unremarkable. No pancreatic ductal dilatation or surrounding inflammatory changes. Spleen: Normal in size without focal abnormality. Adrenals/Urinary Tract: Adrenal glands are unremarkable. Bilateral renal cysts are noted for which no further follow-up is required. No hydronephrosis or renal obstruction is noted. Stomach/Bowel: Stomach is unremarkable. There is no evidence of bowel obstruction or inflammation. The appendix appears normal. Diverticulosis of transverse colon is noted without inflammation. Vascular/Lymphatic: Aortic atherosclerosis. No enlarged abdominal or pelvic lymph nodes. Reproductive: Uterus and bilateral adnexa are unremarkable. Other: Minimal ascites is noted around the liver and spleen. No hernia. Musculoskeletal: Status post surgical posterior fusion of L3-4 with bilateral intrapedicular screw placement. 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 IMPRESSION: Nodular hepatic contours are noted suggesting hepatic cirrhosis. Moderate gallbladder wall thickening is noted without cholelithiasis. Ultrasound is recommended for further evaluation. Minimal ascites. Diverticulosis of transverse colon without inflammation. Aortic Atherosclerosis (ICD10-I70.0). Electronically Signed   By: Lupita Raider M.D.   On: 03/31/2023 16:38   DG Chest Port 1 View Result Date: 03/28/2023 CLINICAL DATA:  weakness EXAM: PORTABLE CHEST 1 VIEW COMPARISON:  Chest x-ray 01/02/2023, CT cardiac 03/10/2023 FINDINGS: Persistent cardiomegaly. The heart and mediastinal contours are unchanged. Atherosclerotic plaque. Aortic valve replacement. Coronary artery calcification. Mitral annular calcification. Prominent hilar vasculature. No focal consolidation. No over pulmonary edema. No pleural effusion. No pneumothorax. No acute osseous abnormality.  Intact sternotomy wires noted. IMPRESSION: 1. Cardiomegaly with pulmonary venous congestion. 2. Aortic Atherosclerosis (ICD10-I70.0) including  coronary artery and mitral annular calcification. Electronically Signed   By: Tish Frederickson M.D.   On: 03/28/2023 17:17   CT Head Wo Contrast Result Date: 03/28/2023 CLINICAL DATA:  Facial paralysis/weakness (CN  7) possible cva not acute EXAM: CT HEAD WITHOUT CONTRAST TECHNIQUE: Contiguous axial images were obtained from the base of the skull through the vertex without intravenous contrast. RADIATION DOSE REDUCTION: This exam was performed according to the departmental dose-optimization program which includes automated exposure control, adjustment of the mA and/or kV according to patient size and/or use of iterative reconstruction technique. COMPARISON:  CT head 01/02/2023, MRI head 05/02/2021 FINDINGS: Brain: Patchy and confluent areas of decreased attenuation are noted throughout the deep and periventricular white matter of the cerebral hemispheres bilaterally, compatible with chronic microvascular ischemic disease. No evidence of large-territorial acute infarction. No parenchymal hemorrhage. No mass lesion. No extra-axial collection. No mass effect or midline shift. No hydrocephalus. Basilar cisterns are patent. Vascular: No hyperdense vessel. Atherosclerotic calcifications are present within the cavernous internal carotid arteries. Skull: No acute fracture or focal lesion. Sinuses/Orbits: Paranasal sinuses and mastoid air cells are clear. Chronic mild proptosis bilaterally. Bilateral lens replacement. Otherwise the orbits are unremarkable. Other: None. IMPRESSION: 1. No acute intracranial abnormality. 2. Chronic mild proptosis bilaterally. Consider ophthalmology consultation. Electronically Signed   By: Tish Frederickson M.D.   On: 03/28/2023 17:05     The total time spent in the appointment was 55 minutes encounter with patients including review of chart and various tests results, discussions about plan of care and coordination of care plan   All questions were answered. The patient knows to call the  clinic with any problems, questions or concerns. No barriers to learning was detected.  Thank you for the courtesy of this consultation, Jaci Standard, MD  3/13/20255:31 PM  I have read the above note and personally examined the patient. I agree with the assessment and plan as noted above.  Briefly Mrs. Alishah Schulte is a 63 year old female who was admitted due to hypertension weakness during hemodialysis session.  She is found to have anemia, thrombocytopenia, and concern for possible GI bleed.  Hematology was consulted due to concern for eosinophilia.  At this time etiology is unclear, though suspect inflammation.  Lower suspicion for primary hematological process, however will order hypereosinophilia workup in order to effectively rule that out.  Infectious diseases also been consulted and is evaluating for possible infectious etiologies. Elevated troponin likely does not represent end organ damage; it is declining without intervention and was elevated since Nov 2024 when eosinophils were considerably lower (and patient underwent TAVR). Patient voiced understanding of our findings today moving forward.  Will conduct a full workup, much of which is genetic testing which will take 7-10 business days to return. Will make determination by tomorrow if starting prednisone 1 mg/kg would be appropriate. Would want to avoid steroids in setting of GI infection (concern with possible bloody stool as reported by patient). CT abdomen and labwork will help [place] make this decision.   Ulysees Barns, MD Department of Hematology/Oncology [facility] Phone: [phone] Pager: [phone] Email: [email or web address]

## 2023-04-23 NOTE — Evaluation (Signed)
 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  Clinical Impression  Patient presents with decreased mobility due to abdominal pain, generalized weakness, decreased balance and decreased activity tolerance.  Currently needing min A for short in room ambulation and previously mobilizing on her own when family at work.  Seems they can manage to have someone with her most of the time at least initially.  Hopeful for skilled PT at d/c via HHPT.  PT will continue to follow while in the acute setting.         If plan is discharge home, recommend the following: A little help with walking and/or transfers;A little help with bathing/dressing/bathroom;Assistance with cooking/housework;Help with stairs or ramp for entrance;Assist for transportation   Can travel by private vehicle        Equipment Recommendations None recommended by PT  Recommendations for Other Services       Functional Status Assessment Patient has had a recent decline in their functional status and demonstrates the ability to make significant improvements in function in a reasonable and predictable amount of time.     Precautions / Restrictions Precautions Precautions: Fall Recall of Precautions/Restrictions: Impaired      Mobility  Bed Mobility Overal bed mobility: Needs Assistance Bed Mobility: Supine to Sit, Sit to Supine     Supine to sit: Used rails, HOB elevated, Min assist Sit to supine: Supervision   General bed mobility comments: increased time, assist to initiate and lift trunk due to abdominal pain    Transfers Overall transfer level: Needs assistance Equipment  used: Rolling walker (2 wheels) Transfers: Sit to/from Stand Sit to Stand: Min assist           General transfer comment: some lifting help to stand (daughter helping too)    Ambulation/Gait   Gait Distance (Feet): 15 Feet (x 2) Assistive device: Rolling walker (2 wheels) Gait Pattern/deviations: Step-to pattern, Decreased stride length, Trunk flexed       General Gait Details: walked to window to sit on couch then back to bed  Stairs            Wheelchair Mobility     Tilt Bed    Modified Rankin (Stroke Patients Only)       Balance   Sitting-balance support: No upper extremity supported Sitting balance-Leahy Scale: Fair       Standing balance-Leahy Scale: Poor Standing balance comment: generally weak in trunk, medicated for pain per RN and CBG low though she was given supplement to help                             Pertinent Vitals/Pain Pain Assessment Pain Assessment: 0-10 Pain Score: 8  Pain Location: abdomen Pain Descriptors / Indicators: Discomfort, Grimacing, Guarding Pain Intervention(s): Monitored during session, Premedicated before session, Repositioned, Limited activity within patient's tolerance    Home Living Family/patient expects to be discharged to:: Private residence Living Arrangements: Children Available Help at Discharge: Family;Available PRN/intermittently Type of Home: House Home Access: Level entry     Alternate Level Stairs-Number of Steps: 12; upstairs to kitchen Home Layout: Multi-level Home Equipment: Cane - single Librarian, academic (2 wheels);Rollator (4 wheels);Shower  seat;Toilet riser;Grab bars - tub/shower;Hand held shower head;Transport chair Additional Comments: Pt lives with daughter and son in law    Prior Function Prior Level of Function : Independent/Modified Independent             Mobility Comments: family takes pt to HD, uses cane in home and rollator out of home ADLs Comments: son in  law does pt's laundry, and tries to assist her with meals. sits for showers, reports ind with ADLs but has difficulty finding the energy for it. Does not drive     Extremity/Trunk Assessment   Upper Extremity Assessment Upper Extremity Assessment: Defer to OT evaluation    Lower Extremity Assessment Lower Extremity Assessment: RLE deficits/detail;LLE deficits/detail RLE Deficits / Details: AROM WFL, strength 4-/5 RLE Sensation: history of peripheral neuropathy LLE Deficits / Details: AROM WFL,s trength 4-/5 LLE Sensation: history of peripheral neuropathy       Communication   Communication Communication: No apparent difficulties    Cognition Arousal: Alert Behavior During Therapy: WFL for tasks assessed/performed   PT - Cognitive impairments: Memory, Initiation, Problem solving                       PT - Cognition Comments: slow processing, increased time to initiate Following commands: Impaired Following commands impaired: Follows one step commands with increased time, Only follows one step commands consistently     Cueing       General Comments General comments (skin integrity, edema, etc.): daughter in the room and supportive    Exercises     Assessment/Plan    PT Assessment Patient needs continued PT services  PT Problem List Decreased strength;Decreased mobility;Decreased activity tolerance;Decreased balance;Decreased knowledge of use of DME;Pain;Decreased safety awareness       PT Treatment Interventions DME instruction;Gait training;Functional mobility training;Therapeutic activities;Therapeutic exercise;Balance training;Patient/family education;Cognitive remediation    PT Goals (Current goals can be found in the Care Plan section)  Acute Rehab PT Goals Patient Stated Goal: to go home PT Goal Formulation: With patient/family Time For Goal Achievement: 05/07/23 Potential to Achieve Goals: Good    Frequency Min 3X/week     Co-evaluation                AM-PAC PT "6 Clicks" Mobility  Outcome Measure Help needed turning from your back to your side while in a flat bed without using bedrails?: A Little Help needed moving from lying on your back to sitting on the side of a flat bed without using bedrails?: A Little Help needed moving to and from a bed to a chair (including a wheelchair)?: A Little Help needed standing up from a chair using your arms (e.g., wheelchair or bedside chair)?: A Little Help needed to walk in hospital room?: A Little Help needed climbing 3-5 steps with a railing? : Total 6 Click Score: 16    End of Session Equipment Utilized During Treatment: Gait belt;Oxygen Activity Tolerance: Patient limited by fatigue;Patient limited by pain Patient left: in bed;with call bell/phone within reach;with family/visitor present   PT Visit Diagnosis: Muscle weakness (generalized) (M62.81);Pain;Other abnormalities of gait and mobility (R26.89) Pain - part of body:  (abdomen)    Time: 8469-6295 PT Time Calculation (min) (ACUTE ONLY): 29 min   Charges:   PT Evaluation $PT Eval Moderate Complexity: 1 Mod PT Treatments $Gait Training: 8-22 mins PT General Charges $$ ACUTE PT VISIT: 1 Visit         Sheran Lawless, PT Acute Rehabilitation Services Office:[phone]  04/23/2023   Jodi Bell 04/23/2023, 1:44 PM

## 2023-04-23 NOTE — Progress Notes (Signed)
 Hypoglycemic Event  CBG: 54  Treatment: D50 50 mL (25 gm)  Symptoms: None  Follow-up CBG: Time:15 min CBG Result:124  Possible Reasons for Event: Inadequate meal intake  Comments/MD notified:MD Vann notified, new orders placed.      Lovey Newcomer

## 2023-04-23 NOTE — Progress Notes (Signed)
 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  She had this similar episodes last few PRN treatments including Friday.  Patient had recent dental work, has not been eating well and has lost weight-her weight in November was 68 kg and in feb 55kg. Patient underwent further workup vitals showed hypotensive 70s improved with fluid bolus in the ED, got a dose of midodrine placed on supplemental oxygen, labs with hypokalemia will close 68 creatinine 6.5 albumin 2.3 normal LFTs lactic acid, and had leukocytosis 13.8 and thrombocytopenia 62K INR 1.7.  Chest x-ray unremarkable   Significant procedure/imaging: CT chest abdomen pelvis without contrast 3/10: Slight ascending colon wall thickening new from 03/31/2023 difficult to exclude colitis trace pleural effusion and dependent atelectasis, small ascites small pulmonary nodules stable aortic atherosclerosis enlarged pulmonary trunk indicating PAH.     Subjective: Overnight had abdominal pain, diarrhea and ? Blood (no stool studies sent)  Assessment and Plan: Principal Problem:   Hypotension Active Problems:   Non-insulin dependent type 2 diabetes mellitus (HCC)   Essential hypertension   COPD (chronic obstructive pulmonary disease) (HCC)   Tobacco use   GI bleeding   Coronary artery disease involving coronary bypass graft of native heart with angina pectoris (HCC)   Paroxysmal atrial fibrillation (HCC)   Acute on  chronic respiratory failure with hypoxia (HCC)   Thrombocytopenia (HCC)   ESRD on dialysis (HCC)   Cirrhosis of liver (HCC)   Hypotension Hypotension multifactorial-On multiple meds at home clonidine/Coreg/Imdur/nifedipine, and also recent weight loss due to dental extraction and decreased oral intake. BP improved with IV fluids and midodrine in the ED. has leukocytosis see below but no obvious infection.  Echo with EF 55-60% G2 DD RV function mildly reduced RV size mildly enlarged and severely elevated pulmonary artery systolic pressure noted She has not been eating well and has lost weight-her weight in November was 68 kg and in feb 55kg  Abdominal pain/Leukocytosis: Unclear etiology, no other evidence of infection, blood culture from 3/10 NGTD.  Chest x-ray no pneumonia.  Has fistula for HD Unable to get UA despite in and out. CT abdomen difficult to exclude colitis? Checking gip and c diff - ID consulted given ESRD/immunocompromise statu and elevated procalcitonin 2.6. ? Abx- defer to ID -get CTA to r/o mesenteric ischemia  ESRD on HD MWF: Nephrology following closely for HD.  Labs stable   Liver cirrhosis hx-but CT does not report any abnormal liver finding Thrombocytopenia Chronic anemia from renal disease/chronic disease: Trace ascites and pleural effusion on imaging.Has low albumin likely contributing, LFTs stable Baseline platelet 80-100k and baseline hb~8-9 gm in Feb 2025-monitor platelet count hemoglobin as below On admission hb 13-15: likely hemoconcentrated.     Hypokalemia: Resolved.    PAF: NSR.No anticoagulation except for aspirin.   Elevated troponins 812>151 CAD S/P CABG HX of TAVR Severe pulmonary hypertension per echo: No chest pain, troponin elevation unclear etiology likely from demand ischemia.  Echo shows normal EF G2 DD and severe pulmonary  hypertension  Aspirin held on admission due to thrombocytopenia.  History of GI bleeding CT showed debris's and  air in the dilated esophagus indicating dysmotility  She had EGD in the past needing  APC.no evidence of bleeding currently, obtain occult blood as able.diet as tolerated -may need GI consult if CTA unrevealing  COPD: Not in exacerbation - continue inhaler as needed, and home tiotropium Singulair.  T2DM W/ borderline hypoglycemia in 60s in ED: Check accuchecks as NPO for now  Gout: Cont allopurinol.   DVT prophylaxis: SCDs Start: 04/20/23 1921 Code Status:   Code Status: Full Code Family Communication: daughter updated at bedside Patient status is: Remains hospitalized because of severity of illness Level of care: Telemetry Medical   Dispo: The patient is from: Home.            Anticipated disposition: Consulted PT OT  Objective: Vitals last 24 hrs: Vitals:   04/22/23 2000 04/23/23 0422 04/23/23 0500 04/23/23 0730  BP: (!) 144/71  (!) 159/75 (!) 142/77  Pulse: 77  84 88  Resp: (!) 23  18   Temp: 97.9 F (36.6 C)  98.2 F (36.8 C) 98.2 F (36.8 C)  TempSrc: Oral  Oral Axillary  SpO2: 94%  95% 100%  Weight:  67.2 kg    Height:       Weight change: 12.2 kg  Physical Examination:  General: Appearance:     Overweight female who appears uncomfortable   + BS, tender in most quadrants  Lungs:     respirations unlabored  Heart:    Normal heart rate.      Neurologic:   Awake, alert, oriented x 3. No apparent focal neurological           defect.      Medications reviewed:  Scheduled Meds:  arformoterol  15 mcg Nebulization BID   And   umeclidinium bromide  1 puff Inhalation Daily   busPIRone  5 mg Oral TID   Chlorhexidine Gluconate Cloth  6 each Topical Q0600   [START ON 04/24/2023] cinacalcet  60 mg Oral Once per day on Monday Friday   And   cinacalcet  90 mg Oral Every Wednesday   pantoprazole (PROTONIX) IV  40 mg Intravenous Q12H   sodium chloride flush  3 mL Intravenous Q12H  Continuous Infusions:  dextrose 30 mL/hr at 04/22/23 1225     Diet Order              Diet NPO time specified Except for: Sips with Meds, Ice Chips  Diet effective now                  Intake/Output Summary (Last 24 hours) at 04/23/2023 1118 Last data filed at 04/22/2023 2000 Gross per 24 hour  Intake 120 ml  Output 800 ml  Net -680 ml   Net IO Since Admission: -200 mL [04/23/23 1118]  Wt Readings from Last 3 Encounters:  04/23/23 67.2 kg  04/01/23 55.4 kg  01/29/23 64 kg     Data Reviewed: I have personally reviewed following labs and imaging studies CBC: Recent Labs  Lab 04/20/23 0827 04/20/23 1010 04/21/23 0450 04/22/23 0546 04/22/23 1055 04/23/23 0204  WBC 13.8*  --  27.6* 28.0* 27.8* 20.6*  NEUTROABS  --   --   --   --  1.6*  --   HGB 13.1 15.0 10.0* 11.0* 11.9* 12.2  HCT 44.6 44.0 32.7* 35.7* 38.0 39.6  MCV 99.3  --  98.2 97.3 96.2  95.4  PLT 62*  --  46* 48* 53* 46*  Basic Metabolic Panel:  Recent Labs  Lab 04/20/23 0950 04/20/23 1010 04/21/23 0450 04/22/23 0546 04/23/23 0204  NA 138 135 131* 131* 132*  K 3.2* 3.2* 3.2* 3.9 3.4*  CL 101 100 104 98 96*  CO2 27  --  24 24 26   GLUCOSE 68* 64* 80 64* 64*  BUN 13 14 15 20 13   CREATININE 6.51* 7.30* 7.13* 8.16* 6.19*  CALCIUM 9.8  --  8.2* 9.1 8.3*  PHOS  --   --  3.1  --   --   GFR: Estimated Creatinine Clearance: 8.5 mL/min (A) (by C-G formula based on SCr of 6.19 mg/dL (H)). Liver Function Tests:  Recent Labs  Lab 04/20/23 0937 04/21/23 0450  AST 19 15  ALT 9 8  ALKPHOS 81 70  BILITOT 0.8 0.4  PROT 5.3* 4.2*  ALBUMIN 2.3* 2.0*  Sepsis Labs: Recent Labs  Lab 04/20/23 1010 04/21/23 0450  PROCALCITON  --  2.65  LATICACIDVEN 1.6  --    Recent Results (from the past 240 hours)  Blood Culture (routine x 2)     Status: None (Preliminary result)   Collection Time: 04/20/23  9:14 AM   Specimen: BLOOD RIGHT HAND  Result Value Ref Range Status   Specimen Description BLOOD RIGHT HAND  Final   Special Requests   Final    BOTTLES DRAWN AEROBIC ONLY Blood Culture results may not  be optimal due to an inadequate volume of blood received in culture bottles   Culture   Final    NO GROWTH 3 DAYS Performed at [facility], [address], [place], [place] [address]    Report Status PENDING  Incomplete  Blood Culture (routine x 2)     Status: None (Preliminary result)   Collection Time: 04/20/23  9:37 AM   Specimen: BLOOD RIGHT ARM  Result Value Ref Range Status   Specimen Description BLOOD RIGHT ARM  Final   Special Requests   Final    BOTTLES DRAWN AEROBIC AND ANAEROBIC Blood Culture results may not be optimal due to an inadequate volume of blood received in culture bottles   Culture   Final    NO GROWTH 3 DAYS Performed at [facility], [address], [place], [place] [address]    Report Status PENDING  Incomplete      Total time spent in review of labs and imaging, patient evaluation, formulation of plan, documentation and communication with family: 35 minutes  Joseph Art, DO  [facility]  04/23/2023, 11:18 AM

## 2023-04-24 ENCOUNTER — Encounter (HOSPITAL_COMMUNITY): Admission: EM | Disposition: E | Payer: Self-pay | Source: Ambulatory Visit | Attending: Internal Medicine

## 2023-04-24 ENCOUNTER — Observation Stay (HOSPITAL_COMMUNITY)

## 2023-04-24 DIAGNOSIS — I350 Nonrheumatic aortic (valve) stenosis: Secondary | ICD-10-CM | POA: Diagnosis not present

## 2023-04-24 DIAGNOSIS — E1122 Type 2 diabetes mellitus with diabetic chronic kidney disease: Secondary | ICD-10-CM | POA: Diagnosis present

## 2023-04-24 DIAGNOSIS — R570 Cardiogenic shock: Secondary | ICD-10-CM | POA: Diagnosis present

## 2023-04-24 DIAGNOSIS — K7460 Unspecified cirrhosis of liver: Secondary | ICD-10-CM | POA: Diagnosis present

## 2023-04-24 DIAGNOSIS — D696 Thrombocytopenia, unspecified: Secondary | ICD-10-CM

## 2023-04-24 DIAGNOSIS — I10 Essential (primary) hypertension: Secondary | ICD-10-CM

## 2023-04-24 DIAGNOSIS — R579 Shock, unspecified: Secondary | ICD-10-CM | POA: Diagnosis not present

## 2023-04-24 DIAGNOSIS — B789 Strongyloidiasis, unspecified: Secondary | ICD-10-CM | POA: Diagnosis present

## 2023-04-24 DIAGNOSIS — I272 Pulmonary hypertension, unspecified: Secondary | ICD-10-CM | POA: Diagnosis present

## 2023-04-24 DIAGNOSIS — I2489 Other forms of acute ischemic heart disease: Secondary | ICD-10-CM | POA: Diagnosis present

## 2023-04-24 DIAGNOSIS — E119 Type 2 diabetes mellitus without complications: Secondary | ICD-10-CM

## 2023-04-24 DIAGNOSIS — I462 Cardiac arrest due to underlying cardiac condition: Secondary | ICD-10-CM | POA: Diagnosis present

## 2023-04-24 DIAGNOSIS — R197 Diarrhea, unspecified: Secondary | ICD-10-CM

## 2023-04-24 DIAGNOSIS — A419 Sepsis, unspecified organism: Secondary | ICD-10-CM | POA: Diagnosis present

## 2023-04-24 DIAGNOSIS — D721 Eosinophilia, unspecified: Secondary | ICD-10-CM | POA: Diagnosis present

## 2023-04-24 DIAGNOSIS — I469 Cardiac arrest, cause unspecified: Secondary | ICD-10-CM | POA: Insufficient documentation

## 2023-04-24 DIAGNOSIS — J9622 Acute and chronic respiratory failure with hypercapnia: Secondary | ICD-10-CM | POA: Diagnosis present

## 2023-04-24 DIAGNOSIS — I132 Hypertensive heart and chronic kidney disease with heart failure and with stage 5 chronic kidney disease, or end stage renal disease: Secondary | ICD-10-CM | POA: Diagnosis present

## 2023-04-24 DIAGNOSIS — D65 Disseminated intravascular coagulation [defibrination syndrome]: Secondary | ICD-10-CM | POA: Diagnosis present

## 2023-04-24 DIAGNOSIS — I953 Hypotension of hemodialysis: Secondary | ICD-10-CM | POA: Diagnosis present

## 2023-04-24 DIAGNOSIS — J9601 Acute respiratory failure with hypoxia: Secondary | ICD-10-CM

## 2023-04-24 DIAGNOSIS — D7219 Other eosinophilia: Secondary | ICD-10-CM | POA: Diagnosis not present

## 2023-04-24 DIAGNOSIS — Z66 Do not resuscitate: Secondary | ICD-10-CM | POA: Diagnosis not present

## 2023-04-24 DIAGNOSIS — J4489 Other specified chronic obstructive pulmonary disease: Secondary | ICD-10-CM | POA: Diagnosis present

## 2023-04-24 DIAGNOSIS — Z992 Dependence on renal dialysis: Secondary | ICD-10-CM | POA: Diagnosis not present

## 2023-04-24 DIAGNOSIS — L4050 Arthropathic psoriasis, unspecified: Secondary | ICD-10-CM | POA: Diagnosis present

## 2023-04-24 DIAGNOSIS — Z515 Encounter for palliative care: Secondary | ICD-10-CM | POA: Diagnosis not present

## 2023-04-24 DIAGNOSIS — N186 End stage renal disease: Secondary | ICD-10-CM | POA: Diagnosis present

## 2023-04-24 DIAGNOSIS — Y831 Surgical operation with implant of artificial internal device as the cause of abnormal reaction of the patient, or of later complication, without mention of misadventure at the time of the procedure: Secondary | ICD-10-CM | POA: Diagnosis present

## 2023-04-24 DIAGNOSIS — I959 Hypotension, unspecified: Secondary | ICD-10-CM | POA: Diagnosis present

## 2023-04-24 DIAGNOSIS — J9621 Acute and chronic respiratory failure with hypoxia: Secondary | ICD-10-CM | POA: Diagnosis present

## 2023-04-24 LAB — COMPREHENSIVE METABOLIC PANEL
ALT: 9 U/L (ref 0–44)
AST: 24 U/L (ref 15–41)
Albumin: <1.5 g/dL — ABNORMAL LOW (ref 3.5–5.0)
Alkaline Phosphatase: 62 U/L (ref 38–126)
Anion gap: 13 (ref 5–15)
BUN: 15 mg/dL (ref 8–23)
CO2: 22 mmol/L (ref 22–32)
Calcium: 7.8 mg/dL — ABNORMAL LOW (ref 8.9–10.3)
Chloride: 97 mmol/L — ABNORMAL LOW (ref 98–111)
Creatinine, Ser: 6.09 mg/dL — ABNORMAL HIGH (ref 0.44–1.00)
GFR, Estimated: 7 mL/min — ABNORMAL LOW (ref >60)
Glucose, Bld: 198 mg/dL — ABNORMAL HIGH (ref 70–99)
Potassium: 4.3 mmol/L (ref 3.5–5.1)
Sodium: 132 mmol/L — ABNORMAL LOW (ref 135–145)
Total Bilirubin: 0.5 mg/dL (ref 0.0–1.2)
Total Protein: <3 g/dL — ABNORMAL LOW (ref 6.5–8.1)

## 2023-04-24 LAB — GLUCOSE, CAPILLARY
Glucose-Capillary: 152 mg/dL — ABNORMAL HIGH (ref 70–99)
Glucose-Capillary: 48 mg/dL — ABNORMAL LOW (ref 70–99)
Glucose-Capillary: 55 mg/dL — ABNORMAL LOW (ref 70–99)
Glucose-Capillary: 60 mg/dL — ABNORMAL LOW (ref 70–99)
Glucose-Capillary: 73 mg/dL (ref 70–99)
Glucose-Capillary: 78 mg/dL (ref 70–99)
Glucose-Capillary: 78 mg/dL (ref 70–99)
Glucose-Capillary: 87 mg/dL (ref 70–99)
Glucose-Capillary: 99 mg/dL (ref 70–99)
Glucose-Capillary: 99 mg/dL (ref 70–99)

## 2023-04-24 LAB — DIC (DISSEMINATED INTRAVASCULAR COAGULATION)PANEL
D-Dimer, Quant: 5.7 ug{FEU}/mL — ABNORMAL HIGH (ref 0.00–0.50)
Fibrinogen: 137 mg/dL — ABNORMAL LOW (ref 210–475)
INR: 3 — ABNORMAL HIGH (ref 0.8–1.2)
Platelets: 30 10*3/uL — ABNORMAL LOW (ref 150–400)
Prothrombin Time: 31.2 s — ABNORMAL HIGH (ref 11.4–15.2)
Smear Review: NONE SEEN
aPTT: 91 s — ABNORMAL HIGH (ref 24–36)

## 2023-04-24 LAB — POCT I-STAT 7, (LYTES, BLD GAS, ICA,H+H)
Acid-base deficit: 4 mmol/L — ABNORMAL HIGH (ref 0.0–2.0)
Bicarbonate: 24 mmol/L (ref 20.0–28.0)
Calcium, Ion: 1.2 mmol/L (ref 1.15–1.40)
HCT: 33 % — ABNORMAL LOW (ref 36.0–46.0)
Hemoglobin: 11.2 g/dL — ABNORMAL LOW (ref 12.0–15.0)
O2 Saturation: 100 %
Patient temperature: 36.6
Potassium: 4.1 mmol/L (ref 3.5–5.1)
Sodium: 128 mmol/L — ABNORMAL LOW (ref 135–145)
TCO2: 26 mmol/L (ref 22–32)
pCO2 arterial: 57.6 mmHg — ABNORMAL HIGH (ref 32–48)
pH, Arterial: 7.225 — ABNORMAL LOW (ref 7.35–7.45)
pO2, Arterial: 288 mmHg — ABNORMAL HIGH (ref 83–108)

## 2023-04-24 LAB — CBC WITH DIFFERENTIAL/PLATELET
Abs Immature Granulocytes: 0.02 10*3/uL (ref 0.00–0.07)
Basophils Absolute: 0 10*3/uL (ref 0.0–0.1)
Basophils Relative: 1 %
Eosinophils Absolute: 0.6 10*3/uL — ABNORMAL HIGH (ref 0.0–0.5)
Eosinophils Relative: 17 %
HCT: 36.4 % (ref 36.0–46.0)
Hemoglobin: 11.8 g/dL — ABNORMAL LOW (ref 12.0–15.0)
Immature Granulocytes: 1 %
Lymphocytes Relative: 17 %
Lymphs Abs: 0.6 10*3/uL — ABNORMAL LOW (ref 0.7–4.0)
MCH: 29.5 pg (ref 26.0–34.0)
MCHC: 32.4 g/dL (ref 30.0–36.0)
MCV: 91 fL (ref 80.0–100.0)
Monocytes Absolute: 0.2 10*3/uL (ref 0.1–1.0)
Monocytes Relative: 7 %
Neutro Abs: 2.1 10*3/uL (ref 1.7–7.7)
Neutrophils Relative %: 57 %
Platelets: 36 10*3/uL — ABNORMAL LOW (ref 150–400)
RBC: 4 MIL/uL (ref 3.87–5.11)
RDW: 19.5 % — ABNORMAL HIGH (ref 11.5–15.5)
Smear Review: DECREASED
WBC Morphology: INCREASED
WBC: 3.6 10*3/uL — ABNORMAL LOW (ref 4.0–10.5)
nRBC: 0.8 % — ABNORMAL HIGH (ref 0.0–0.2)

## 2023-04-24 LAB — HEPATITIS B SURFACE ANTIBODY, QUANTITATIVE: Hep B S AB Quant (Post): 11.5 m[IU]/mL

## 2023-04-24 LAB — CBC
HCT: 37.8 % (ref 36.0–46.0)
HCT: 30.9 % — ABNORMAL LOW (ref 36.0–46.0)
Hemoglobin: 11.7 g/dL — ABNORMAL LOW (ref 12.0–15.0)
Hemoglobin: 9.5 g/dL — ABNORMAL LOW (ref 12.0–15.0)
MCH: 29.5 pg (ref 26.0–34.0)
MCH: 29.5 pg (ref 26.0–34.0)
MCHC: 31 g/dL (ref 30.0–36.0)
MCHC: 30.7 g/dL (ref 30.0–36.0)
MCV: 95.2 fL (ref 80.0–100.0)
MCV: 96 fL (ref 80.0–100.0)
Platelets: 44 10*3/uL — ABNORMAL LOW (ref 150–400)
Platelets: 29 10*3/uL — CL (ref 150–400)
RBC: 3.97 MIL/uL (ref 3.87–5.11)
RBC: 3.22 MIL/uL — ABNORMAL LOW (ref 3.87–5.11)
RDW: 19.9 % — ABNORMAL HIGH (ref 11.5–15.5)
RDW: 19.6 % — ABNORMAL HIGH (ref 11.5–15.5)
WBC: 24.9 10*3/uL — ABNORMAL HIGH (ref 4.0–10.5)
WBC: 6.1 10*3/uL (ref 4.0–10.5)
nRBC: 0.1 % (ref 0.0–0.2)
nRBC: 0.3 % — ABNORMAL HIGH (ref 0.0–0.2)

## 2023-04-24 LAB — BASIC METABOLIC PANEL
Anion gap: 8 (ref 5–15)
BUN: 20 mg/dL (ref 8–23)
CO2: 26 mmol/L (ref 22–32)
Calcium: 8 mg/dL — ABNORMAL LOW (ref 8.9–10.3)
Chloride: 94 mmol/L — ABNORMAL LOW (ref 98–111)
Creatinine, Ser: 7.23 mg/dL — ABNORMAL HIGH (ref 0.44–1.00)
GFR, Estimated: 6 mL/min — ABNORMAL LOW (ref >60)
Glucose, Bld: 88 mg/dL (ref 70–99)
Potassium: 4 mmol/L (ref 3.5–5.1)
Sodium: 128 mmol/L — ABNORMAL LOW (ref 135–145)

## 2023-04-24 LAB — MAGNESIUM: Magnesium: 1.4 mg/dL — ABNORMAL LOW (ref 1.7–2.4)

## 2023-04-24 LAB — PHOSPHORUS: Phosphorus: 3.2 mg/dL (ref 2.5–4.6)

## 2023-04-24 SURGERY — TRANSESOPHAGEAL ECHOCARDIOGRAM (TEE) (CATHLAB)
Anesthesia: Monitor Anesthesia Care

## 2023-04-24 MED ORDER — DOCUSATE SODIUM 50 MG/5ML PO LIQD
100.0000 mg | Freq: Two times a day (BID) | ORAL | Status: DC
  Administered 2023-04-24 – 2023-04-25 (×3): 100 mg
  Filled 2023-04-24 (×3): qty 10

## 2023-04-24 MED ORDER — REVEFENACIN 175 MCG/3ML IN SOLN
175.0000 ug | Freq: Every day | RESPIRATORY_TRACT | Status: DC
  Administered 2023-04-25: 175 ug via RESPIRATORY_TRACT
  Filled 2023-04-24: qty 3

## 2023-04-24 MED ORDER — SODIUM CHLORIDE 0.9 % IV SOLN: INTRAVENOUS | Status: AC | PRN

## 2023-04-24 MED ORDER — BUDESONIDE 0.5 MG/2ML IN SUSP: 0.5000 mg | Freq: Two times a day (BID) | RESPIRATORY_TRACT | Status: DC

## 2023-04-24 MED ORDER — EPINEPHRINE HCL 5 MG/250ML IV SOLN IN NS
0.5000 ug/min | INTRAVENOUS | Status: DC
  Administered 2023-04-24: 20 ug/min via INTRAVENOUS
  Filled 2023-04-24: qty 250

## 2023-04-24 MED ORDER — LIDOCAINE-PRILOCAINE 2.5-2.5 % EX CREA: 1.0000 | TOPICAL_CREAM | CUTANEOUS | Status: DC | PRN

## 2023-04-24 MED ORDER — REVEFENACIN 175 MCG/3ML IN SOLN: 175.0000 ug | Freq: Every day | RESPIRATORY_TRACT | Status: DC

## 2023-04-24 MED ORDER — DEXTROSE 50 % IV SOLN
INTRAVENOUS | Status: AC
  Administered 2023-04-24: 25 mL
  Filled 2023-04-24: qty 50

## 2023-04-24 MED ORDER — FENTANYL CITRATE PF 50 MCG/ML IJ SOSY
25.0000 ug | PREFILLED_SYRINGE | INTRAMUSCULAR | Status: DC | PRN
  Administered 2023-04-24: 50 ug via INTRAVENOUS
  Administered 2023-04-24: 25 ug via INTRAVENOUS
  Administered 2023-04-25 (×2): 50 ug via INTRAVENOUS
  Filled 2023-04-24 (×4): qty 1

## 2023-04-24 MED ORDER — AMIODARONE HCL IN DEXTROSE 360-4.14 MG/200ML-% IV SOLN
30.0000 mg/h | INTRAVENOUS | Status: DC
  Administered 2023-04-25: 30 mg/h via INTRAVENOUS
  Filled 2023-04-24 (×2): qty 200

## 2023-04-24 MED ORDER — ORAL CARE MOUTH RINSE
15.0000 mL | OROMUCOSAL | Status: DC
  Administered 2023-04-24 – 2023-04-25 (×11): 15 mL via OROMUCOSAL

## 2023-04-24 MED ORDER — LIDOCAINE HCL (PF) 1 % IJ SOLN: 5.0000 mL | INTRAMUSCULAR | Status: DC | PRN

## 2023-04-24 MED ORDER — FENTANYL CITRATE PF 50 MCG/ML IJ SOSY: 25.0000 ug | PREFILLED_SYRINGE | INTRAMUSCULAR | Status: DC | PRN

## 2023-04-24 MED ORDER — POLYETHYLENE GLYCOL 3350 17 G PO PACK
17.0000 g | PACK | Freq: Every day | ORAL | Status: DC
  Administered 2023-04-24 – 2023-04-25 (×2): 17 g
  Filled 2023-04-24 (×2): qty 1

## 2023-04-24 MED ORDER — IVERMECTIN 3 MG PO TABS
200.0000 ug/kg | ORAL_TABLET | Freq: Once | ORAL | Status: AC
  Administered 2023-04-24: 12000 ug via ORAL
  Filled 2023-04-24: qty 4

## 2023-04-24 MED ORDER — MIDAZOLAM HCL 2 MG/2ML IJ SOLN
1.0000 mg | INTRAMUSCULAR | Status: DC | PRN
  Administered 2023-04-25: 2 mg via INTRAVENOUS
  Filled 2023-04-24: qty 2

## 2023-04-24 MED ORDER — MAGNESIUM SULFATE 4 GM/100ML IV SOLN
4.0000 g | Freq: Once | INTRAVENOUS | Status: AC
  Administered 2023-04-24: 4 g via INTRAVENOUS
  Filled 2023-04-24: qty 100

## 2023-04-24 MED ORDER — SODIUM CHLORIDE 0.9 % IV SOLN
20.0000 ug | Freq: Once | INTRAVENOUS | Status: AC
  Administered 2023-04-24: 20 ug via INTRAVENOUS
  Filled 2023-04-24: qty 5

## 2023-04-24 MED ORDER — AMIODARONE HCL IN DEXTROSE 360-4.14 MG/200ML-% IV SOLN
INTRAVENOUS | Status: AC
  Filled 2023-04-24: qty 200

## 2023-04-24 MED ORDER — ORAL CARE MOUTH RINSE: 15.0000 mL | OROMUCOSAL | Status: DC | PRN

## 2023-04-24 MED ORDER — NOREPINEPHRINE 4 MG/250ML-% IV SOLN
INTRAVENOUS | Status: AC
  Filled 2023-04-24: qty 250

## 2023-04-24 MED ORDER — DEXTROSE 50 % IV SOLN
25.0000 mL | Freq: Once | INTRAVENOUS | Status: AC
  Administered 2023-04-24: 25 mL via INTRAVENOUS
  Filled 2023-04-24: qty 50

## 2023-04-24 MED ORDER — BUDESONIDE 0.5 MG/2ML IN SUSP
0.5000 mg | Freq: Two times a day (BID) | RESPIRATORY_TRACT | Status: DC
  Administered 2023-04-24 – 2023-04-25 (×2): 0.5 mg via RESPIRATORY_TRACT
  Filled 2023-04-24 (×2): qty 2

## 2023-04-24 MED ORDER — SODIUM CHLORIDE 0.9 % IV SOLN
100.0000 mg | Freq: Two times a day (BID) | INTRAVENOUS | Status: DC
  Administered 2023-04-24 – 2023-04-25 (×3): 100 mg via INTRAVENOUS
  Filled 2023-04-24 (×4): qty 100

## 2023-04-24 MED ORDER — FAMOTIDINE 20 MG PO TABS: 10.0000 mg | ORAL_TABLET | Freq: Every day | ORAL | Status: DC

## 2023-04-24 MED ORDER — VASOPRESSIN 20 UNITS/100 ML INFUSION FOR SHOCK
0.0000 [IU]/min | INTRAVENOUS | Status: DC
  Administered 2023-04-24 – 2023-04-25 (×3): 0.03 [IU]/min via INTRAVENOUS
  Filled 2023-04-24 (×3): qty 100

## 2023-04-24 MED ORDER — HYDROCORTISONE SOD SUC (PF) 100 MG IJ SOLR
100.0000 mg | Freq: Three times a day (TID) | INTRAMUSCULAR | Status: DC
  Administered 2023-04-24 – 2023-04-25 (×4): 100 mg via INTRAVENOUS
  Filled 2023-04-24 (×4): qty 2

## 2023-04-24 MED ORDER — DEXTROSE 50 % IV SOLN
12.5000 g | INTRAVENOUS | Status: AC
  Administered 2023-04-24: 12.5 g via INTRAVENOUS
  Filled 2023-04-24: qty 50

## 2023-04-24 MED ORDER — DEXTROSE 50 % IV SOLN
1.0000 | Freq: Once | INTRAVENOUS | Status: AC
Start: 2023-04-24 — End: 2023-04-24

## 2023-04-24 MED ORDER — DEXTROSE 50 % IV SOLN
INTRAVENOUS | Status: AC
  Administered 2023-04-24: 50 mL via INTRAVENOUS
  Filled 2023-04-24: qty 50

## 2023-04-24 MED ORDER — AMIODARONE HCL IN DEXTROSE 360-4.14 MG/200ML-% IV SOLN
60.0000 mg/h | INTRAVENOUS | Status: AC
  Administered 2023-04-24 (×2): 60 mg/h via INTRAVENOUS
  Filled 2023-04-24: qty 200

## 2023-04-24 MED ORDER — AMIODARONE HCL IN DEXTROSE 360-4.14 MG/200ML-% IV SOLN
INTRAVENOUS | Status: AC
  Administered 2023-04-24: 60 mg/h
  Filled 2023-04-24: qty 200

## 2023-04-24 MED ORDER — VASOPRESSIN 20 UNITS/100 ML INFUSION FOR SHOCK
INTRAVENOUS | Status: AC
  Administered 2023-04-24: 0.03 [IU]/min via INTRAVENOUS
  Filled 2023-04-24: qty 100

## 2023-04-24 MED ORDER — NOREPINEPHRINE 4 MG/250ML-% IV SOLN
0.0000 ug/min | INTRAVENOUS | Status: DC
  Administered 2023-04-24: 30 ug/min via INTRAVENOUS

## 2023-04-24 MED ORDER — PENTAFLUOROPROP-TETRAFLUOROETH EX AERO: 1.0000 | INHALATION_SPRAY | CUTANEOUS | Status: DC | PRN

## 2023-04-24 MED ORDER — NOREPINEPHRINE 16 MG/250ML-% IV SOLN
0.0000 ug/min | INTRAVENOUS | Status: DC
  Administered 2023-04-24: 34 ug/min via INTRAVENOUS
  Administered 2023-04-24 – 2023-04-25 (×2): 30 ug/min via INTRAVENOUS
  Administered 2023-04-25: 26 ug/min via INTRAVENOUS
  Filled 2023-04-24 (×4): qty 250

## 2023-04-24 MED ORDER — IVERMECTIN 3 MG PO TABS
200.0000 ug/kg | ORAL_TABLET | Freq: Once | ORAL | Status: DC
  Filled 2023-04-24: qty 4

## 2023-04-24 MED ORDER — DEXMEDETOMIDINE HCL IN NACL 400 MCG/100ML IV SOLN
0.0000 ug/kg/h | INTRAVENOUS | Status: DC
  Administered 2023-04-24: 0.4 ug/kg/h via INTRAVENOUS
  Filled 2023-04-24 (×2): qty 100

## 2023-04-24 MED ORDER — PIPERACILLIN-TAZOBACTAM IN DEX 2-0.25 GM/50ML IV SOLN
2.2500 g | Freq: Four times a day (QID) | INTRAVENOUS | Status: DC
  Administered 2023-04-24 – 2023-04-25 (×5): 2.25 g via INTRAVENOUS
  Filled 2023-04-24 (×8): qty 50

## 2023-04-24 MED ORDER — LACTATED RINGERS IV BOLUS: 1000.0000 mL | Freq: Once | INTRAVENOUS | Status: DC

## 2023-04-24 NOTE — Procedures (Signed)
 Intubation Procedure Note  Jodi Bell  [id number]  Jan 11, 1961  Date:04/24/23  Time:12:22 PM   Provider Performing:Janaysha Depaulo    Procedure: Intubation (31500)  Indication(s) Respiratory Failure  Consent Unable to obtain consent due to emergent nature of procedure.   Anesthesia None during cardiac arrest   Time Out Verified patient identification, verified procedure, site/side was marked, verified correct patient position, special equipment/implants available, medications/allergies/relevant history reviewed, required imaging and test results available.   Sterile Technique Usual hand hygeine, masks, and gloves were used   Procedure Description Patient positioned in bed supine.  Sedation given as noted above.  Patient was intubated with endotracheal tube using  DL .  View was Grade 1 full glottis .  Number of attempts was 1.  Colorimetric CO2 detector was consistent with tracheal placement.   Complications/Tolerance None; patient tolerated the procedure well. Chest X-ray is ordered to verify placement.   EBL Minimal   Specimen(s) None

## 2023-04-24 NOTE — Procedures (Signed)
 Cardiopulmonary Resuscitation Note  Jodi Bell  [id number]  1960/07/03  Date:04/24/23  Time:12:21 PM   Provider Performing:Kameryn Davern   Procedure: Cardiopulmonary Resuscitation (92950)  Indication(s) Loss of Pulse  Consent N/A  Anesthesia N/A   Time Out N/A   Sterile Technique Hand hygiene, gloves   Procedure Description Called to patient's room for CODE BLUE. Initial rhythm was PEA/Asystole. Patient received high quality chest compressions for 6 minutes with defibrillation or cardioversion when appropriate. Epinephrine was administered every 3 minutes as directed by time Biomedical engineer. Additional pharmacologic interventions included sodium bicarbonate. Additional procedural interventions include intra-osseus line and intubation.  Return of spontaneous circulation was achieved.  Family called and notified.   Complications/Tolerance N/A   EBL N/A   Specimen(s) N/A  Estimated time to ROSC: 

## 2023-04-24 NOTE — Procedures (Signed)
 Central Venous Catheter Insertion Procedure Note  JANIFER GIESELMAN  [id number]  October 21, 1960  Date:04/24/23  Time:12:24 PM   Provider Performing:Les Longmore   Procedure: Insertion of Non-tunneled Central Venous Catheter(36556) with US guidance (08657)   Indication(s) Difficult access  Consent Unable to obtain consent due to emergent nature of procedure.  Anesthesia Topical only with 1% lidocaine   Timeout Verified patient identification, verified procedure, site/side was marked, verified correct patient position, special equipment/implants available, medications/allergies/relevant history reviewed, required imaging and test results available.  Sterile Technique Maximal sterile technique including full sterile barrier drape, hand hygiene, sterile gown, sterile gloves, mask, hair covering, sterile ultrasound probe cover (if used).  Procedure Description Area of catheter insertion was cleaned with chlorhexidine and draped in sterile fashion.  With real-time ultrasound guidance a central venous catheter was placed into the right subclavian vein. Nonpulsatile blood flow and easy flushing noted in all ports.  The catheter was sutured in place and sterile dressing applied.  Complications/Tolerance None; patient tolerated the procedure well. Chest X-ray is ordered to verify placement for internal jugular or subclavian cannulation.   Chest x-ray is not ordered for femoral cannulation.  EBL Minimal  Specimen(s) None

## 2023-04-24 NOTE — Procedures (Signed)
 Arterial Line Insertion Start/End3/14/2025 11:30 AM, 04/24/2023 11:47 AM  Emergency situation Right, radial was placed Catheter size: 20 G Hand hygiene performed  and maximum sterile barriers used  Allen's test indicative of satisfactory collateral circulation Attempts: 1 Procedure performed without using ultrasound guided technique. Following insertion, dressing applied and Biopatch. Patient tolerated the procedure well with no immediate complications.

## 2023-04-24 NOTE — Procedures (Signed)
 Intraosseous Needle Insertion Procedure Note    Date:04/24/23  Time:12:23 PM   Provider Performing:Rosina Cressler   Procedure: Insertion Intraosseous (46962)  Indication(s) Difficult access  Consent Unable to obtain consent due to emergent nature of procedure.  Anesthesia Topical only with 1% lidocaine   Timeout Verified patient identification, verified procedure, site/side was marked, verified correct patient position, special equipment/implants available, medications/allergies/relevant history reviewed, required imaging and test results available.  Procedure Description Area of needle insertion was cleaned with chlorhexidine. Intraosseous needle was placed into the right tibia. Bone marrow was aspirated and site easily flushed. The needle was secured in place and dressing applied.  Complications/Tolerance None; patient tolerated the procedure well.  EBL Minimal

## 2023-04-24 NOTE — Progress Notes (Signed)
 MD Merrily Pew) notified of critical lab result of platelet count of 29.

## 2023-04-24 NOTE — Progress Notes (Signed)
 Jodi Bell   DOB:1960/12/27   WU#:[id number]      ASSESSMENT & PLAN:  Status post CPR 04/24/2023 Cardiac arrest -Patient unresponsive during dialysis today.  Code activated.  Transferred to ICU.  Seen intubated.  - ICU team managing  Eosinophilia - CBC with differential 04/22/2023 shows elevated absolute eosinophils 23.4 - Etiology unclear low suspicion for inflammatory process with lower suspicion for primary hematologic process. - Path smear review shows marked eosinophilia. - CBC done 1 month ago 04/01/2023 with slightly elevated absolute eos 0.7.  Also noted slight elevation 1.1 earlier in February 2025. - May be due to parasitic infection, inflammation, or bone marrow disorder - Blood cultures drawn 04/20/2023 with no growth to date. - CT abdomen pelvis done 04/23/2023 suggestive of enteritis. - Ordered hypereosinophilia workup to include tryptase, vitamin B12, MPN panel, IgE and immunoglobulin studies and hypereosinophilia panel.  Of note the genetic testing with the MPN panel and hypereosinophilia panel will take 7-10 business days to return. - TEE 04/24/2023 cancelled due to code.  - Recommend continuation of steroid therapy, at least equivalent of 1 mg/kg prednisone daily to help lower eosinophils - ID follow-up for infectious process.  On Zosyn and ivermectin.  Workup pending - Hematology/Dr. Leonides Schanz following   Thrombocytopenia History of cirrhosis - Platelets remain low low 46-->44K-->29k postdialysis today.  Platelets dropping since mid February 96K. - May be due to cirrhosis, medications. - Hep b neg. - Transfuse platelets for counts <20 K or <50 K with active bleeding.   - Continue to monitor CBC with differential   Anemia, normocytic History of recurrent GI bleed Concern for current GI bleed - Mild - Likely due to chronic disease/ESRD - Hemoglobin dropped 2g postdialysis today 11.7-->9.5. - B12 level adequate. -Monitor closely   ESRD - On hemodialysis 3 times  per week (MWF).  Patient had dialysis session today during which time she was noted to be unresponsive - Nephrology managing   Diarrhea - Patient c/o diarrhea with bloody stool at home prior to admission.   - Several positive FOBT's noted prior admissions, most recent 01/02/2023. -- Recommend fobt  Hypertension Diabetes - Monitor BP levels - Continue antihypertensives as ordered - Monitor blood sugar levels      Code Status DNR-INTERV  Subjective:  Patient seen in ICU, intubated.  ICU team managing.   Objective:  Vitals:   04/24/23 1136 04/24/23 1144  BP: (!) 58/50   Pulse:    Resp: (!) 26   Temp:  97.8 F (36.6 C)  SpO2:       Intake/Output Summary (Last 24 hours) at 04/24/2023 1344 Last data filed at 04/24/2023 1300 Gross per 24 hour  Intake 2251.66 ml  Output 0 ml  Net 2251.66 ml     REVIEW OF SYSTEMS:  Unable to obtain  PHYSICAL EXAMINATION: ECOG PERFORMANCE STATUS: 4 - Bedbound  Vitals:   04/24/23 1136 04/24/23 1144  BP: (!) 58/50   Pulse:    Resp: (!) 26   Temp:  97.8 F (36.6 C)  SpO2:     Filed Weights   04/23/23 0422 04/24/23 0500 04/24/23 0943  Weight: 148 lb 2.4 oz (67.2 kg) 147 lb 11.3 oz (67 kg) 139 lb 5.3 oz (63.2 kg)    GENERAL:+intubated SKIN: skin color, texture, turgor are normal, no rashes or significant lesions EYES: normal, conjunctiva are pink and non-injected, sclera clear OROPHARYNX: +intubated NECK: supple, thyroid normal size, non-tender, without nodularity LYMPH: no palpable lymphadenopathy in the cervical, axillary or inguinal  LUNGS: clear to auscultation and percussion with normal breathing effort HEART: regular rate & rhythm and no murmurs and no lower extremity edema ABDOMEN: abdomen soft, non-tender and normal bowel sounds MUSCULOSKELETAL: no cyanosis of digits and no clubbing  PSYCH: +intubated NEURO: no focal motor/sensory deficits   All questions were answered. The patient knows to call the clinic with any  problems, questions or concerns.   The total time spent in the appointment was 40 minutes encounter with patient including review of chart and various tests results, discussions about plan of care and coordination of care plan  Dawson Bills, NP 04/24/2023 1:44 PM    Labs Reviewed:  Lab Results  Component Value Date   WBC 6.1 04/24/2023   HGB 11.2 (L) 04/24/2023   HCT 33.0 (L) 04/24/2023   MCV 96.0 04/24/2023   PLT 29 (LL) 04/24/2023   PLT 30 (L) 04/24/2023   Recent Labs    01/02/23 1811 01/02/23 2045 04/20/23 0937 04/20/23 0950 04/21/23 0450 04/22/23 0546 04/23/23 0204 04/24/23 0419 04/24/23 1224 04/24/23 1231  NA 131*   < >  --    < > 131*   < > 132* 128* 132* 128*  K 6.2*   < >  --    < > 3.2*   < > 3.4* 4.0 4.3 4.1  CL 100   < >  --    < > 104   < > 96* 94* 97*  --   CO2 22   < >  --    < > 24   < > 26 26 22   --   GLUCOSE 90   < >  --    < > 80   < > 64* 88 198*  --   BUN 51*   < >  --    < > 15   < > 13 20 15   --   CREATININE 8.19*   < >  --    < > 7.13*   < > 6.19* 7.23* 6.09*  --   CALCIUM 8.3*   < >  --    < > 8.2*   < > 8.3* 8.0* 7.8*  --   GFRNONAA 5*   < >  --    < > 6*   < > 7* 6* 7*  --   PROT 5.0*   < > 5.3*  --  4.2*  --   --   --  <3.0*  --   ALBUMIN 2.4*  2.4*   < > 2.3*  --  2.0*  --   --   --  <1.5*  --   AST 22   < > 19  --  15  --   --   --  24  --   ALT 11   < > 9  --  8  --   --   --  9  --   ALKPHOS 48   < > 81  --  70  --   --   --  62  --   BILITOT 1.2*   < > 0.8  --  0.4  --   --   --  0.5  --   BILIDIR 0.6*  --  0.2  --   --   --   --   --   --   --   IBILI 0.6  --  0.6  --   --   --   --   --   --   --    < > =  values in this interval not displayed.    Studies Reviewed:  CT Angio Abd/Pel w/ and/or w/o Result Date: 04/23/2023 CLINICAL DATA:  Acute mesenteric ischemia, colitis EXAM: CTA ABDOMEN AND PELVIS WITHOUT AND WITH CONTRAST TECHNIQUE: Multidetector CT imaging of the abdomen and pelvis was performed using the standard protocol  during bolus administration of intravenous contrast. Multiplanar reconstructed images and MIPs were obtained and reviewed to evaluate the vascular anatomy. RADIATION DOSE REDUCTION: This exam was performed according to the departmental dose-optimization program which includes automated exposure control, adjustment of the mA and/or kV according to patient size and/or use of iterative reconstruction technique. CONTRAST:  80mL OMNIPAQUE IOHEXOL 350 MG/ML SOLN COMPARISON:  04/20/2023 FINDINGS: VASCULAR Aorta: Moderate calcified atheromatous plaque. No aneurysm, dissection, or stenosis. Celiac: Patent without evidence of aneurysm, dissection, vasculitis or significant stenosis. SMA: Patent without evidence of aneurysm, dissection, vasculitis or significant stenosis. Renals: Single bilaterally, both with calcified ostial plaque resulting in only mild stenosis, patent distally. IMA: Patent without evidence of aneurysm, dissection, vasculitis or significant stenosis. Inflow: Mild calcified plaque in bilateral common and internal iliac arteries. No aneurysm, dissection, or stenosis. Proximal Outflow: Minimally atheromatous, patent. Veins: Patent hepatic veins, portal vein, SMV. Incomplete opacification of the iliac venous system, IVC, and renal veins. Review of the MIP images confirms the above findings. NON-VASCULAR Lower chest: Trace pleural effusions right greater than left. Dependent atelectasis in the lung bases, worse right. Hepatobiliary: Gallbladder physiologically distended. Small subcentimeter hepatic cysts. No worrisome liver lesion or biliary ductal dilatation. Pancreas: Unremarkable. No pancreatic ductal dilatation or surrounding inflammatory changes. Spleen: Normal in size. Wedge-shaped perfusion defect superiorly, new since 03/10/2023. Adrenals/Urinary Tract: Significantly decreased renal parenchymal enhancement. Extensive renal arterial vascular calcifications. Multiple cortical lesions in both kidneys, some  of which can be characterized as cysts, largest 3.1 cm 5 HU right lower pole; no followup recommended. Urinary bladder nondistended. Stomach/Bowel: Stomach is distended by gas and fluid without acute finding. Circumferential wall thickening with heterogenous enhancement in proximal and mid small bowel loops with which remain nondilated. No pneumatosis. The more distal small bowel is unremarkable. Normal appendix. The colon is partially distended, with scattered diverticula in ascending and descending segments, without adjacent inflammatory change. Lymphatic: No abdominal or pelvic adenopathy. Reproductive: Uterus and bilateral adnexa are unremarkable. Other: Trace pelvic, perihepatic and perisplenic ascites. No free air. Musculoskeletal: Post PLIF L3-4. chronic erosive and destructive changes L2-3 with some loss of height. Coarse diffuse osseous sclerosis at all the levels, chronic. IMPRESSION: 1. No proximal visceral arterial occlusive disease to suggest an etiology of occlusive mesenteric ischemia. 2. Proximal and mid small bowel wall thickening and enhancement, suggesting enteritis. 3. Wedge-shaped perfusion defect in the superior spleen, new since 03/10/2023, suggesting infarct. 4. Significantly decreased renal parenchymal enhancement, suggesting renal insufficiency. 5. Trace pleural effusions and ascites. 6.  Aortic Atherosclerosis (ICD10-I70.0). Electronically Signed   By: Corlis Leak M.D.   On: 04/23/2023 20:39   ECHOCARDIOGRAM COMPLETE BUBBLE STUDY Result Date: 04/21/2023    ECHOCARDIOGRAM REPORT   Patient Name:   LUVERNE FARONE Date of Exam: 04/21/2023 Medical Rec #:  [id number]        Height:       62.0 in Accession #:    [id number]       Weight:       122.1 lb Date of Birth:  1960-07-08         BSA:          1.550 m Patient Age:    84  years         BP:           117/58 mmHg Patient Gender: F                HR:           71 bpm. Exam Location:  Inpatient Procedure: 2D Echo, 3D Echo, Cardiac Doppler, Color  Doppler, Strain Analysis and            Saline Contrast Bubble Study (Both Spectral and Color Flow Doppler            were utilized during procedure). Indications:    Acute Hypoxemic Respiratory Failure  History:        Patient has prior history of Echocardiogram examinations, most                 recent 02/02/2023. Prior CABG, COPD; Risk Factors:Hypertension,                 Diabetes and Former Smoker.  Sonographer:    Karma Ganja Referring Phys: [id number] EKTA V PATEL IMPRESSIONS  1. Mildly D-shaped interventricular septum suggestive of a degree of RV pressure/volume overload. Left ventricular ejection fraction, by estimation, is 55 to 60%. The left ventricle has normal function. The left ventricle has no regional wall motion abnormalities. There is moderate concentric left ventricular hypertrophy. Left ventricular diastolic parameters are consistent with Grade II diastolic dysfunction (pseudonormalization). The average left ventricular global longitudinal strain is -13.7 %. The global longitudinal strain is abnormal.  2. Right ventricular systolic function is mildly reduced. The right ventricular size is mildly enlarged. There is severely elevated pulmonary artery systolic pressure. The estimated right ventricular systolic pressure is 64.6 mmHg.  3. Left atrial size was severely dilated.  4. Right atrial size was moderately dilated.  5. Bubble study is negative (a few bubbles cross very late).  6. The mitral valve is degenerative. Trivial mitral valve regurgitation. Mild mitral stenosis. The mean mitral valve gradient is 5.0 mmHg. Moderate mitral annular calcification.  7. The tricuspid valve is abnormal. Tricuspid valve regurgitation is moderate.  8. 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 Mild perivalvular leakage. Mean gradient 36 mmmHg with dimensionless index 0.38 and EOA 1.48 cm^2. LVOT VTI 33 with SV 126 mL. There does appear to be significant bioprosthetic aortic  valve stenosis,  similar gradient to prior echo. However, high gradient is likely exacerbated by high flow.  9. The inferior vena cava is dilated in size with <50% respiratory variability, suggesting right atrial pressure of 15 mmHg. FINDINGS  Left Ventricle: Mildly D-shaped interventricular septum suggestive of a degree of RV pressure/volume overload. Left ventricular ejection fraction, by estimation, is 55 to 60%. The left ventricle has normal function. The left ventricle has no regional wall motion abnormalities. The average left ventricular global longitudinal strain is -13.7 %. Strain was performed and the global longitudinal strain is abnormal. The left ventricular internal cavity size was normal in size. There is moderate concentric  left ventricular hypertrophy. Left ventricular diastolic parameters are consistent with Grade II diastolic dysfunction (pseudonormalization). Right Ventricle: The right ventricular size is mildly enlarged. No increase in right ventricular wall thickness. Right ventricular systolic function is mildly reduced. There is severely elevated pulmonary artery systolic pressure. The tricuspid regurgitant velocity is 3.52 m/s, and with an assumed right atrial pressure of 15 mmHg, the estimated right ventricular systolic pressure is 64.6 mmHg. Left Atrium: Left atrial size was severely dilated. Right  Atrium: Right atrial size was moderately dilated. Pericardium: There is no evidence of pericardial effusion. Mitral Valve: The mitral valve is degenerative in appearance. There is mild calcification of the mitral valve leaflet(s). Moderate mitral annular calcification. Trivial mitral valve regurgitation. Mild mitral valve stenosis. MV peak gradient, 13.1 mmHg. The mean mitral valve gradient is 5.0 mmHg. Tricuspid Valve: The tricuspid valve is abnormal. Tricuspid valve regurgitation is moderate. 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 Mild perivalvular leakage. Mean  gradient 36 mmmHg with dimensionless index 0.38 and EOA 1.48 cm^2. LVOT VTI 33 with SV 126 mL. There does appear to be significant bioprosthetic aortic valve stenosis, similar gradient to prior echo. However, high gradient is likely exacerbated by high flow. The aortic valve has been repaired/replaced. Aortic valve regurgitation is mild. Aortic valve mean gradient measures 36.0 mmHg. Aortic valve peak gradient measures 62.4 mmHg. Aortic valve area, by VTI measures 1.48 cm. Pulmonic Valve: The pulmonic valve was normal in structure. Pulmonic valve regurgitation is trivial. Aorta: The aortic root is normal in size and structure. Venous: The inferior vena cava is dilated in size with less than 50% respiratory variability, suggesting right atrial pressure of 15 mmHg. IAS/Shunts: Bubble study is negative (a few bubbles cross very late). Agitated saline contrast was given intravenously to evaluate for intracardiac shunting.  LEFT VENTRICLE PLAX 2D LVIDd:         3.70 cm   Diastology LVIDs:         2.40 cm   LV e' medial:    5.22 cm/s LV PW:         2.40 cm   LV E/e' medial:  31.0 LV IVS:        1.50 cm   LV e' lateral:   6.53 cm/s LVOT diam:     2.20 cm   LV E/e' lateral: 24.8 LV SV:         126 LV SV Index:   81        2D Longitudinal Strain LVOT Area:     3.80 cm  2D Strain GLS Avg:     -13.7 %                           3D Volume EF:                          3D EF:        57 %                          LV EDV:       159 ml                          LV ESV:       69 ml                          LV SV:        90 ml RIGHT VENTRICLE            IVC RV Basal diam:  4.60 cm    IVC diam: 2.30 cm RV S prime:     7.51 cm/s TAPSE (M-mode): 1.4 cm LEFT ATRIUM              Index  RIGHT ATRIUM           Index LA diam:        4.50 cm  2.90 cm/m    RA Area:     24.10 cm LA Vol (A2C):   158.0 ml 101.92 ml/m  RA Volume:   71.00 ml  45.80 ml/m LA Vol (A4C):   83.5 ml  53.86 ml/m LA Biplane Vol: 117.0 ml 75.47 ml/m  AORTIC  VALVE AV Area (Vmax):    1.44 cm AV Area (Vmean):   1.37 cm AV Area (VTI):     1.48 cm AV Vmax:           395.00 cm/s AV Vmean:          280.000 cm/s AV VTI:            0.853 m AV Peak Grad:      62.4 mmHg AV Mean Grad:      36.0 mmHg LVOT Vmax:         150.00 cm/s LVOT Vmean:        101.000 cm/s LVOT VTI:          0.332 m LVOT/AV VTI ratio: 0.39  AORTA Ao Root diam: 2.80 cm Ao Asc diam:  2.80 cm MITRAL VALVE                TRICUSPID VALVE MV Area (PHT): 3.48 cm     TR Peak grad:   49.6 mmHg MV Area VTI:   2.75 cm     TR Vmax:        352.00 cm/s MV Peak grad:  13.1 mmHg MV Mean grad:  5.0 mmHg     SHUNTS MV Vmax:       1.81 m/s     Systemic VTI:  0.33 m MV Vmean:      109.0 cm/s   Systemic Diam: 2.20 cm MV Decel Time: 218 msec MV E velocity: 162.00 cm/s MV A velocity: 126.00 cm/s MV E/A ratio:  1.29 Dalton McleanMD Electronically signed by Wilfred Lacy Signature Date/Time: 04/21/2023/10:56:31 AM    Final    CT CHEST ABDOMEN PELVIS WO CONTRAST Result Date: 04/20/2023 CLINICAL DATA:  Sepsis. EXAM: CT CHEST, ABDOMEN AND PELVIS WITHOUT CONTRAST TECHNIQUE: Multidetector CT imaging of the chest, abdomen and pelvis was performed following the standard protocol without IV contrast. RADIATION DOSE REDUCTION: This exam was performed according to the departmental dose-optimization program which includes automated exposure control, adjustment of the mA and/or kV according to patient size and/or use of iterative reconstruction technique. COMPARISON:  CT abdomen pelvis 03/31/2023, CT chest 07/24/2022. FINDINGS: CT CHEST FINDINGS Cardiovascular: Atherosclerotic calcification of the aorta. Aortic valve repair. Three-vessel coronary artery calcification. Enlarged pulmonic trunk and heart. No pericardial effusion. Mediastinum/Nodes: No pathologically enlarged mediastinal or axillary lymph nodes. Hilar regions are difficult to definitively evaluate without IV contrast. Debris and air in a dilated esophagus. Lungs/Pleura:  Very minimal patchy ground-glass in the right upper lobe, likely infectious/inflammatory in etiology. A few scattered pulmonary nodules measure 5 mm or less in size, unchanged. Small right pleural effusion and trace left pleural effusion with dependent atelectasis in the lower lobes. No pleural fluid. Airway is unremarkable. Musculoskeletal: Degenerative changes in the spine. Increased bone density is in keeping with renal osteodystrophy. CT ABDOMEN PELVIS FINDINGS Hepatobiliary: Probable small cyst in the inferior right hepatic lobe. No specific follow-up necessary. Gallbladder is minimally prominent and the wall appears slightly thickened, unchanged from 07/24/2022. No biliary ductal dilatation. Pancreas: Negative. Spleen:  Negative. Adrenals/Urinary Tract: Adrenal glands are unremarkable. Numerous low-attenuation lesions in the kidneys. No specific follow-up necessary. Bilateral renal stones. Ureters are decompressed. Bladder is very low in volume. Stomach/Bowel: Similar chronic distal gastric wall thickening. Stomach, small bowel and appendix are otherwise grossly unremarkable. Slight thickening of the wall of the ascending colon, new from 03/31/2023. Colon is otherwise grossly unremarkable. Vascular/Lymphatic: Atherosclerotic calcification of the aorta. No pathologically enlarged lymph nodes. Reproductive: Uterus is visualized.  No adnexal mass. Other: Small ascites.  Diffuse body wall edema. Musculoskeletal: Degenerative changes in the spine. Increased bone density, indicative of renal osteodystrophy. L3-4 posterior lumbar interbody fusion. Old L2 compression fracture. IMPRESSION: 1. Slight ascending colonic wall thickening, new from 03/31/2023. Difficult to exclude colitis. 2. Tiny right and trace left pleural effusions with minimal dependent atelectasis in both lower lobes. 3. Small ascites. 4. Debris and air in a dilated esophagus, indicative of dysmotility. 5. Small pulmonary nodules, stable from  07/24/2022. Recommend attention on follow-up. 6. Bilateral renal stones. 7. Aortic atherosclerosis (ICD10-I70.0). Three-vessel coronary artery calcification. 8. Enlarged pulmonic trunk, indicative of pulmonary arterial hypertension. Electronically Signed   By: Leanna Battles M.D.   On: 04/20/2023 16:30   DG Chest Port 1 View Result Date: 04/20/2023 CLINICAL DATA:  Questionable sepsis - evaluate for abnormality EXAM: PORTABLE CHEST 1 VIEW COMPARISON:  03/28/2023. FINDINGS: Low lung volume. There are atelectatic changes/scarring at the left lung base. Bilateral lung fields are otherwise clear. Bilateral costophrenic angles are clear. Note is made of elevated right hemidiaphragm. Stable mildly enlarged cardio-mediastinal silhouette. There are surgical staples along the heart border and sternotomy wires, status post CABG (coronary artery bypass graft). Prosthetic aortic valve seen. No acute osseous abnormalities. Lower cervical spinal fixation hardware partially seen. The soft tissues are within normal limits. IMPRESSION: No active disease. Electronically Signed   By: Jules Schick M.D.   On: 04/20/2023 10:21   US Abdomen Limited RUQ (LIVER/GB) Result Date: 04/01/2023 CLINICAL DATA:  Right upper quadrant pain EXAM: ULTRASOUND ABDOMEN LIMITED RIGHT UPPER QUADRANT COMPARISON:  CT from the previous day. FINDINGS: Gallbladder: Gallbladder is decompressed with wall thickening. This is in part due to the decompressed state as well as underlying ascites. No cholelithiasis is seen. Common bile duct: Diameter: 1.9 mm. Liver: Increase in echogenicity consistent with fatty infiltration. Portal vein is patent on color Doppler imaging with normal direction of blood flow towards the liver. Other: None. IMPRESSION: Decompressed gallbladder with wall thickening accentuated by ascites. Correlate with the physical exam. HIDA scan may be helpful to assess for gallbladder function. Fatty liver. Electronically Signed   By: Alcide Clever M.D.   On: 04/01/2023 00:50   CT ABDOMEN PELVIS WO CONTRAST Result Date: 03/31/2023 CLINICAL DATA:  Acute left-sided abdominal pain. EXAM: CT ABDOMEN AND PELVIS WITHOUT CONTRAST TECHNIQUE: Multidetector CT imaging of the abdomen and pelvis was performed following the standard protocol without IV contrast. RADIATION DOSE REDUCTION: This exam was performed according to the departmental dose-optimization program which includes automated exposure control, adjustment of the mA and/or kV according to patient size and/or use of iterative reconstruction technique. COMPARISON:  January 02, 2023. FINDINGS: Lower chest: No acute abnormality. Hepatobiliary: Nodular hepatic contours are noted suggesting hepatic cirrhosis. Moderate gallbladder wall thickening is noted without cholelithiasis. Pancreas: Unremarkable. No pancreatic ductal dilatation or surrounding inflammatory changes. Spleen: Normal in size without focal abnormality. Adrenals/Urinary Tract: Adrenal glands are unremarkable. Bilateral renal cysts are noted for which no further follow-up is required. No hydronephrosis or renal obstruction is noted.  Stomach/Bowel: Stomach is unremarkable. There is no evidence of bowel obstruction or inflammation. The appendix appears normal. Diverticulosis of transverse colon is noted without inflammation. Vascular/Lymphatic: Aortic atherosclerosis. No enlarged abdominal or pelvic lymph nodes. Reproductive: Uterus and bilateral adnexa are unremarkable. Other: Minimal ascites is noted around the liver and spleen. No hernia. Musculoskeletal: Status post surgical posterior fusion of L3-4 with bilateral intrapedicular screw placement. 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 IMPRESSION: Nodular hepatic contours are noted suggesting hepatic cirrhosis. Moderate gallbladder wall thickening is noted without cholelithiasis. Ultrasound is recommended for further evaluation. Minimal ascites.  Diverticulosis of transverse colon without inflammation. Aortic Atherosclerosis (ICD10-I70.0). Electronically Signed   By: Lupita Raider M.D.   On: 03/31/2023 16:38   DG Chest Port 1 View Result Date: 03/28/2023 CLINICAL DATA:  weakness EXAM: PORTABLE CHEST 1 VIEW COMPARISON:  Chest x-ray 01/02/2023, CT cardiac 03/10/2023 FINDINGS: Persistent cardiomegaly. The heart and mediastinal contours are unchanged. Atherosclerotic plaque. Aortic valve replacement. Coronary artery calcification. Mitral annular calcification. Prominent hilar vasculature. No focal consolidation. No over pulmonary edema. No pleural effusion. No pneumothorax. No acute osseous abnormality.  Intact sternotomy wires noted. IMPRESSION: 1. Cardiomegaly with pulmonary venous congestion. 2. Aortic Atherosclerosis (ICD10-I70.0) including coronary artery and mitral annular calcification. Electronically Signed   By: Tish Frederickson M.D.   On: 03/28/2023 17:17   CT Head Wo Contrast Result Date: 03/28/2023 CLINICAL DATA:  Facial paralysis/weakness (CN 7) possible cva not acute EXAM: CT HEAD WITHOUT CONTRAST TECHNIQUE: Contiguous axial images were obtained from the base of the skull through the vertex without intravenous contrast. RADIATION DOSE REDUCTION: This exam was performed according to the departmental dose-optimization program which includes automated exposure control, adjustment of the mA and/or kV according to patient size and/or use of iterative reconstruction technique. COMPARISON:  CT head 01/02/2023, MRI head 05/02/2021 FINDINGS: Brain: Patchy and confluent areas of decreased attenuation are noted throughout the deep and periventricular white matter of the cerebral hemispheres bilaterally, compatible with chronic microvascular ischemic disease. No evidence of large-territorial acute infarction. No parenchymal hemorrhage. No mass lesion. No extra-axial collection. No mass effect or midline shift. No hydrocephalus. Basilar cisterns are  patent. Vascular: No hyperdense vessel. Atherosclerotic calcifications are present within the cavernous internal carotid arteries. Skull: No acute fracture or focal lesion. Sinuses/Orbits: Paranasal sinuses and mastoid air cells are clear. Chronic mild proptosis bilaterally. Bilateral lens replacement. Otherwise the orbits are unremarkable. Other: None. IMPRESSION: 1. No acute intracranial abnormality. 2. Chronic mild proptosis bilaterally. Consider ophthalmology consultation. Electronically Signed   By: Tish Frederickson M.D.   On: 03/28/2023 17:05

## 2023-04-24 NOTE — Procedures (Signed)
 DIRECT CURRENT CARDIOVERSION   Jodi Bell  [id number]  Sep 25, 1960   Date:04/24/23  Time:2:28 PM   Provider Performing:Harmonee Tozer   INDICATIONS: A-fib with RVR   PROCEDURE:  Informed consent was not obtained due to emergent procedure. Once an appropriate time out was taken, the patient had the defibrillator pads placed in the anterior and posterior position, the patient received a single biphasic, synchronized 120J shock with prompt conversion to sinus rhythm. No apparent complications.      Cheri Fowler, MD [facility] See Amion for pager If no response to pager, please call [phone] until 7pm After 7pm, Please call E-link [phone]

## 2023-04-24 NOTE — Progress Notes (Addendum)
 Two hour recheck bs is 78. On call NP is aware orders to give 1/2 amp d50 and increase d5 rate have been placed and followed as ordered.

## 2023-04-24 NOTE — Progress Notes (Addendum)
 [facility]  Date of Admission:  04/20/2023   Total days of inpatient antibiotics 4  Principal Problem:   Hypotension Active Problems:   Non-insulin dependent type 2 diabetes mellitus (HCC)   Essential hypertension   COPD (chronic obstructive pulmonary disease) (HCC)   Tobacco use   GI bleeding   Coronary artery disease involving coronary bypass graft of native heart with angina pectoris (HCC)   Paroxysmal atrial fibrillation (HCC)   Acute on chronic respiratory failure with hypoxia (HCC)   Thrombocytopenia (HCC)   ESRD on dialysis (HCC)   Cirrhosis of liver (HCC)   Eosinophilia   Cardiac arrest (HCC)   Cardiogenic shock [facility])          Assessment: 63 year old female with ESRD on HD VF, chronic hypoxic respiratory failure on 2 L O2, diabetes mellitus, aortic stenosis status post TAVR in 2024, A-fib on aspirin admitted with weakness and hypotension continues to have [place] ptosis ID engaged #Leukocytosis eosinophilia #Thrombocytopenia(chronic) #Aortic stenosis status post TAVR with concern for splenic wedge infarct - WBC around 27K since admission.  Patient has not been on antibiotics.  CT showed slight ascending colonic thickening cannot exclude colitis trace pleural effusions, small pulmonary nodule stable from 07/24/2022, bilateral renal stones ID engaged as etiology of leukocytosis unclear..  Patient denies diarrhea.  She denies dysuria. - TTE showed no vegetations. - Patient remains afebrile.  We added differentials to CBC which shows eosinophilia and thrombocytopenia -CTA  abdomen and pelvis showed small bowel wall thickening suggestive of enteritis.  Possible superior spleen wedge infarct -Patient has not had diarrhea although imaging is concerning for enteritis.  I will order stool ova parasite, stool cultures. -Patient coded in HD suspect likely aspiration transferred to ICU Recommendations:  - Infectious etiology is highly unlikely  given her symptoms and exposures. - Oncology following, hypereos workup pending. - TEE(canceled form today due to code).  Will recommend getting TEE given veg infarct in spleen, possibly emboli from heart(I'm for possible cardiac involvement secondary to hypereosinophilic syndrome) - I will add Doxy at this point, although seasonally tickborne illness is not likely and thrombocytopenia is chronic.  There is no rash or tick exposure history either. Eosinophilia/thormb can sometimes happen with Ehrlichia, although again unlikely in March and was someone with no tick exposure/rash.  Ehrlichia, RMSF, Lyme pending -Follow-up stool studies for possible parasitic infection.  Follow-up strongyloidiasis antibody.  I will give her a dose of ivermectin.  Patient does not have diarrhea as such this is unlikely. -Blood Cx - Okay to continue pip-tazo for now.  Patient was steroids.     Evaluation of this patient requires complex antimicrobial therapy evaluation and counseling + isolation needs for disease transmission risk assessment and mitigation    Microbiology:   Antibiotics: NA Cultures: Blood 3/10 ng Urine  Other   SUBJECTIVE: Resting in bed. No new complaints.  Interval: Afebrile overnight  Review of Systems: Review of Systems  All other systems reviewed and are negative.    Scheduled Meds:  arformoterol  15 mcg Nebulization BID   budesonide (PULMICORT) nebulizer solution  0.5 mg Nebulization BID   Chlorhexidine Gluconate Cloth  6 each Topical Q0600   docusate  100 mg Per Tube BID   hydrocortisone sod succinate (SOLU-CORTEF) inj  100 mg Intravenous Q8H   ivermectin  200 mcg/kg Oral Once   pantoprazole (PROTONIX) IV  40 mg Intravenous Q12H   polyethylene glycol  17 g Per Tube  Daily   [START ON 04/25/2023] revefenacin  175 mcg Nebulization Daily   sodium chloride flush  3 mL Intravenous Q12H   Continuous Infusions:  sodium chloride     epinephrine Stopped (04/24/23 1229)    lactated ringers 999 mL/hr at 04/24/23 1300   norepinephrine (LEVOPHED) Adult infusion     piperacillin-tazobactam (ZOSYN)  IV     vasopressin 0.03 Units/min (04/24/23 1300)   PRN Meds:.Place/Maintain arterial line **AND** sodium chloride, acetaminophen **OR** acetaminophen, albuterol, alum & mag hydroxide-simeth, fentaNYL (SUBLIMAZE) injection, fentaNYL (SUBLIMAZE) injection, guaiFENesin, midazolam, polyvinyl alcohol, zolpidem Allergies  Allergen Reactions   3-Methyl-2-Benzothiazolinone Hydrazone Other (See Comments)    Muscle weakness muscle cramping in legs Other reaction(s): Myalgias (Muscle Pain)   Banana Anaphylaxis, Swelling and Other (See Comments)    TONGUE AND MOUTH SWELLING   Black Buyer, retail (Non-Screening) Anaphylaxis and Itching    Walnuts   Hazelnut (Filbert) Anaphylaxis    Hazelnuts   Leflunomide And Related Other (See Comments)    Severe Headache   Lisinopril Swelling    Angioedema  Swelling of the face   No [employer] Food Allergies Anaphylaxis    Spicy Mustard 4.7.2021 Pt reports that she eats Regular Yellow Mustard Swelling and itching of tongue   Other Anaphylaxis, Swelling and Other (See Comments)    Cosentyx= angioedema Mouth itches and tongue swelling Hazel nuts and pecans also Walnuts Renal failure Serotonin syndrome  Tremors Muscle weakness muscle cramping in legs Other reaction(s): Myalgias (Muscle Pain)   Pecan Nut (Diagnostic) Anaphylaxis and Itching    Pecans   Trazodone And Nefazodone Other (See Comments)    Serotonin Syndrome  Tremors   Adalimumab Other (See Comments)    Blisters on abdomen =humira   Escitalopram Oxalate Other (See Comments)    Hand problems - Serotonin Syndrome     Ezetimibe Other (See Comments)    myalgias cramps    Secukinumab Swelling    Cosentyx = angiodema    Statins Other (See Comments)    Leg pains and weakness   Ferrlecit [Na Ferric Gluc Cplx In Sucrose]     Not reaction given from HD    Gabapentin Other (See Comments)    tremors   Nsaids     Avoid due to renal problems     OBJECTIVE: Vitals:   04/24/23 1121 04/24/23 1133 04/24/23 1136 04/24/23 1144  BP:  (!) 73/61 (!) 58/50   Pulse:      Resp:  (!) 23 (!) 26   Temp:    97.8 F (36.6 C)  TempSrc:    Axillary  SpO2: 100%     Weight:      Height:       Body mass index is 25.48 kg/m.  Physical Exam Constitutional:      Appearance: Normal appearance.  HENT:     Head: Normocephalic and atraumatic.     Right Ear: Tympanic membrane normal.     Left Ear: Tympanic membrane normal.     Nose: Nose normal.     Mouth/Throat:     Mouth: Mucous membranes are moist.  Eyes:     Extraocular Movements: Extraocular movements intact.     Conjunctiva/sclera: Conjunctivae normal.     Pupils: Pupils are equal, round, and reactive to light.  Cardiovascular:     Rate and Rhythm: Normal rate and regular rhythm.     Heart sounds: No murmur heard.    No friction rub. No gallop.  Pulmonary:     Effort: Pulmonary  effort is normal.     Breath sounds: Normal breath sounds.  Abdominal:     General: Abdomen is flat.     Palpations: Abdomen is soft.  Musculoskeletal:        General: Normal range of motion.  Skin:    General: Skin is warm and dry.  Neurological:     General: No focal deficit present.     Mental Status: She is alert and oriented to person, place, and time.  Psychiatric:        Mood and Affect: Mood normal.       Lab Results Lab Results  Component Value Date   WBC 6.1 04/24/2023   HGB 11.2 (L) 04/24/2023   HCT 33.0 (L) 04/24/2023   MCV 96.0 04/24/2023   PLT 29 (LL) 04/24/2023   PLT PENDING 04/24/2023    Lab Results  Component Value Date   CREATININE 7.23 (H) 04/24/2023   BUN 20 04/24/2023   NA 128 (L) 04/24/2023   K 4.1 04/24/2023   CL 94 (L) 04/24/2023   CO2 26 04/24/2023    Lab Results  Component Value Date   ALT 8 04/21/2023   AST 15 04/21/2023   ALKPHOS 70 04/21/2023   BILITOT 0.4  04/21/2023        Danelle Earthly, MD [facility] 04/24/2023, 1:08 PM

## 2023-04-24 NOTE — Consult Note (Addendum)
 NAME:  Jodi Bell, MRN:  [id number], DOB:  06-01-1960, LOS: 0 ADMISSION DATE:  04/20/2023, CONSULTATION DATE:  3/14 REFERRING MD:  Dr. Jonathon Bellows, CHIEF COMPLAINT:  Cardiac arrest.    History of Present Illness:   55F CAD s/p CABG, ESRD on HD, chronic hypoxemic respiratory failure on 2L, DM, s/p TAVR, AF on asa, cirrhosis, and recurrent GI bleed. Admitted 3/10 after experiencing weakness and hypotension during her outpatient HD treatment. Hypotension was felt to be medication related. Multiple antihypertensives at home, the thought is maybe she no longer needed so many/such high doses. Occult blood was positive, but hemoglobin was stable. Home antihypertensives were held.  Improved with some volume. Echo with some evidence of pulmonary hypertension. Course complicated by abdominal pain. CT showed slight ascending colon wall thickening. Leukocytosis. ID consulted for possible antibiotics. Eosinophil noted. Hematology called and workup sent. 3/14 during HD she suffered cardiac arrest. Brief duration. Intubated, transferred to ICU.   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   Significant Hospital Events: Including procedures, antibiotic start and stop dates in addition to other pertinent events   3/10 admit to [facility] for hypotension 3/14 code in HD, tx to ICU.   Interim History / Subjective:    Objective   Blood pressure (!) 127/94, pulse 88,  temperature 98.2 F (36.8 C), resp. rate (!) 31, height 5\' 2"  (1.575 m), weight 63.2 kg, SpO2 93%.        Intake/Output Summary (Last 24 hours) at 04/24/2023 1109 Last data filed at 04/23/2023 2000 Gross per 24 hour  Intake 30 ml  Output 0 ml  Net 30 ml   Filed Weights   04/23/23 0422 04/24/23 0500 04/24/23 0943  Weight: 67.2 kg 67 kg 63.2 kg    Examination: General: Elderly appearing female in NAD HENT: [place]/AT, PERRL, JVD Lungs: Clear vent assisted breaths Cardiovascular: IRIR tachy Abdomen: Soft, NT, ND Extremities: No acute deformity or significant edema.  Neuro: Coma   Resolved Hospital Problem list     Assessment & Plan:   Cardiac arrest: Sound like it was pretty sudden during HD. Likely did not tolerate fluid shift well although events surrounding her arrest are not well defined.  - Transfer to ICU - ETT for airway protection - TEE pending - Avoid fever - Allow to wake up if able.  - STAT labs  Cardiogenic shock following arrest, may also be dry happened during HD - NE, vaso for MAP goal 65. - TEE pending for ID workup - Give back gentle volume  Acute respiratory failure with hypoxia: Aspriated during code - full vent support - CXR and ABG pending - VAP bundle - Zosyn  Leukocytosis Eosinophilia Thrombocytopenia Anemia of chronic disease : occult blood also positive - ID and hematology following, workup pending - ? Roundworm, continue ivermectin per ID - BC NGTD - Smear pending - infectious workup per ID.  - Trend CBC  ESRD on HD MWF - nephrology following, suspect she will require CRRT.  - BMP now  Diarrhea - supportive care. FOBTs have been positive historically. Hgb stable.  HTN - holding home medications due to shock  DM - CBG monitoring and SSI  Family updated via phone and are coming in   Best Practice (right click and "Reselect all SmartList Selections" daily)   Diet/type: NPO DVT prophylaxis SCD Pressure ulcer(s): N/A GI  prophylaxis: PPI Lines: Central line and Arterial Line Foley:  N/A Code Status:  full code Last date of multidisciplinary goals of care discussion [ ]   Labs   CBC: Recent Labs  Lab 04/22/23 0546 04/22/23 1055 04/23/23 0204 04/23/23 2126 04/24/23 0419  WBC 28.0* 27.8* 20.6* 33.0* 24.9*  NEUTROABS  --  1.6*  --  5.3  --   HGB 11.0* 11.9* 12.2 11.9* 11.7*  HCT 35.7* 38.0 39.6 38.1 37.8  MCV 97.3 96.2 95.4 96.2 95.2  PLT 48* 53* 46* 46* 44*    Basic Metabolic Panel: Recent Labs  Lab 04/20/23 0950 04/20/23 1010 04/21/23 0450 04/22/23 0546 04/23/23 0204 04/24/23 0419  NA 138 135 131* 131* 132* 128*  K 3.2* 3.2* 3.2* 3.9 3.4* 4.0  CL 101 100 104 98 96* 94*  CO2 27  --  24 24 26 26   GLUCOSE 68* 64* 80 64* 64* 88  BUN 13 14 15 20 13 20   CREATININE 6.51* 7.30* 7.13* 8.16* 6.19* 7.23*  CALCIUM 9.8  --  8.2* 9.1 8.3* 8.0*  PHOS  --   --  3.1  --   --   --    GFR: Estimated Creatinine Clearance: 7 mL/min (A) (by C-G formula based on SCr of 7.23 mg/dL (H)). Recent Labs  Lab 04/20/23 1010 04/21/23 0450 04/22/23 0546 04/22/23 1055 04/23/23 0204 04/23/23 2126 04/24/23 0419  PROCALCITON  --  2.65  --   --   --   --   --   WBC  --  27.6*   < > 27.8* 20.6* 33.0* 24.9*  LATICACIDVEN 1.6  --   --   --   --   --   --    < > = values in this interval not displayed.    Liver Function Tests: Recent Labs  Lab 04/20/23 0937 04/21/23 0450  AST 19 15  ALT 9 8  ALKPHOS 81 70  BILITOT 0.8 0.4  PROT 5.3* 4.2*  ALBUMIN 2.3* 2.0*   No results for input(s): "LIPASE", "AMYLASE" in the last 168 hours. No results for input(s): "AMMONIA" in the last 168 hours.  ABG    Component Value Date/Time   PHART 7.240 (L) 06/26/2022 0808   PCO2ART 61.1 (H) 06/26/2022 0808   PO2ART 68 (L) 06/26/2022 0808   HCO3 25.1 01/02/2023 2045   TCO2 25 04/20/2023 1010   ACIDBASEDEF 1.0 01/02/2023 2045   O2SAT 93 01/02/2023 2045     Coagulation Profile: Recent Labs  Lab 04/20/23 0937  INR  1.7*    Cardiac Enzymes: No results for input(s): "CKTOTAL", "CKMB", "CKMBINDEX", "TROPONINI" in the last 168 hours.  HbA1C: Hgb A1c MFr Bld  Date/Time Value Ref Range Status  09/20/2022 12:17 AM 4.5 (L) 4.8 - 5.6 % Final    Comment:    (NOTE) Pre diabetes:          5.7%-6.4%  Diabetes:              >6.4%  Glycemic control for   <7.0% adults with diabetes   03/18/2022 07:47 PM 4.8 4.8 - 5.6 % Final    Comment:    (NOTE) Pre diabetes:  5.7%-6.4%  Diabetes:              >6.4%  Glycemic control for   <7.0% adults with diabetes     CBG: Recent Labs  Lab 04/23/23 2101 04/24/23 0005 04/24/23 0035 04/24/23 0231 04/24/23 0534  GLUCAP 76 60* 99 78 78    Review of Systems:   Patient is encephalopathic and/or intubated; therefore, history has been obtained from chart review.    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   Surgical History:   Past Surgical History:  Procedure Laterality Date   BACK SURGERY  2016   lower back fusion with cage   BIOPSY  09/23/2017   Procedure: BIOPSY;  Surgeon: Graylin Shiver, MD;  Location: [facility] ENDOSCOPY;  Service: Endoscopy;;   BIOPSY  05/19/2019   Procedure: BIOPSY;  Surgeon: Kathi Der, MD;  Location: [facility] ENDOSCOPY;  Service: Gastroenterology;;   CESAREAN SECTION  1990   x 1    COLONOSCOPY WITH PROPOFOL N/A 09/23/2017   Procedure: COLONOSCOPY WITH PROPOFOL Hemostatic  clips placed;  Surgeon: Graylin Shiver, MD;  Location: [facility] ENDOSCOPY;  Service: Endoscopy;  Laterality: N/A;   COLONOSCOPY WITH PROPOFOL N/A 10/02/2020   Procedure: COLONOSCOPY WITH PROPOFOL;  Surgeon: Willis Modena, MD;  Location: [facility] ENDOSCOPY;  Service: Endoscopy;  Laterality: N/A;   CORONARY ARTERY BYPASS GRAFT N/A 09/13/2020   Procedure: CORONARY ARTERY BYPASS GRAFTING (CABG), ON PUMP, TIMES TWO, USING LEFT INTERNAL MAMMARY ARTERY AND RIGHT ENDOSCOPICALLY HARVESTED GREATER SAPHENOUS VEIN;  Surgeon: Alleen Borne, MD;  Location: [facility] OR;  Service: Open Heart Surgery;  Laterality: N/A;   DILATION AND CURETTAGE OF UTERUS  1988   ENTEROSCOPY N/A 07/22/2021   Procedure: ENTEROSCOPY;  Surgeon: Kerin Salen, MD;  Location: [facility] ENDOSCOPY;  Service: Gastroenterology;  Laterality: N/A;   ENTEROSCOPY N/A 01/28/2023   Procedure: ENTEROSCOPY;  Surgeon: [name], MD;  Location: [facility] ENDOSCOPY;  Service: Gastroenterology;  Laterality: N/A;   ESOPHAGOGASTRODUODENOSCOPY N/A 09/22/2020   Procedure: ESOPHAGOGASTRODUODENOSCOPY (EGD);  Surgeon: Willis Modena, MD;  Location: [facility] ENDOSCOPY;  Service: Endoscopy;  Laterality: N/A;   ESOPHAGOGASTRODUODENOSCOPY (EGD) WITH PROPOFOL N/A 09/23/2017   Procedure: ESOPHAGOGASTRODUODENOSCOPY (EGD) WITH PROPOFOL;  Surgeon: Graylin Shiver, MD;  Location: [facility] ENDOSCOPY;  Service: Endoscopy;  Laterality: N/A;   ESOPHAGOGASTRODUODENOSCOPY (EGD) WITH PROPOFOL N/A 05/19/2019   Procedure: ESOPHAGOGASTRODUODENOSCOPY (EGD) WITH PROPOFOL;  Surgeon: Kathi Der, MD;  Location: [facility] ENDOSCOPY;  Service: Gastroenterology;  Laterality: N/A;   ESOPHAGOGASTRODUODENOSCOPY (EGD) WITH PROPOFOL N/A 10/02/2020   Procedure: ESOPHAGOGASTRODUODENOSCOPY (EGD) WITH PROPOFOL;  Surgeon: Willis Modena, MD;  Location: [facility] ENDOSCOPY;  Service: Endoscopy;  Laterality: N/A;   GIVENS CAPSULE STUDY N/A 05/19/2019   Procedure: GIVENS CAPSULE STUDY;  Surgeon: Kathi Der, MD;  Location: [facility] ENDOSCOPY;  Service:  Gastroenterology;  Laterality: N/A;   GIVENS CAPSULE STUDY N/A 07/19/2021   Procedure: GIVENS CAPSULE STUDY;  Surgeon: [name], MD;  Location: [facility] ENDOSCOPY;  Service: Gastroenterology;  Laterality: N/A;   HEMOSTASIS CLIP PLACEMENT  09/22/2020   Procedure: HEMOSTASIS CLIP PLACEMENT;  Surgeon: Willis Modena, MD;  Location: [facility] ENDOSCOPY;  Service:  Endoscopy;;   HEMOSTASIS CONTROL  09/22/2020   Procedure: HEMOSTASIS CONTROL;  Surgeon: Willis Modena, MD;  Location: [facility] ENDOSCOPY;  Service: Endoscopy;;   HOT HEMOSTASIS N/A 05/19/2019   Procedure: HOT HEMOSTASIS (ARGON PLASMA COAGULATION/BICAP);  Surgeon: Kathi Der, MD;  Location: [facility] ENDOSCOPY;  Service: Gastroenterology;  Laterality: N/A;   HOT HEMOSTASIS N/A 01/28/2023   Procedure: HOT HEMOSTASIS (ARGON PLASMA COAGULATION/BICAP);  Surgeon: [name], MD;  Location: [facility] ENDOSCOPY;  Service: Gastroenterology;  Laterality: N/A;   INTRAOPERATIVE TRANSTHORACIC ECHOCARDIOGRAM N/A 12/23/2022   Procedure: INTRAOPERATIVE TRANSTHORACIC ECHOCARDIOGRAM;  Surgeon: [name], MD;  [facility]: [facility] INVASIVE CV LAB;  Service: Cardiovascular;  Laterality: N/A;   LEFT HEART CATH AND CORONARY ANGIOGRAPHY N/A 09/10/2020   Procedure: LEFT HEART CATH AND CORONARY ANGIOGRAPHY;  Surgeon: Yvonne Kendall, MD;  Location: [facility] INVASIVE CV LAB;  Service: Cardiovascular;  Laterality: N/A;   PARTIAL KNEE ARTHROPLASTY Left 11/12/2017   Procedure: left unicompartmental arthroplasty-medial;  Surgeon: Durene Romans, MD;  Location: [facility] ORS;  Service: Orthopedics;  Laterality: Left;    RIGHT HEART CATH AND CORONARY/GRAFT ANGIOGRAPHY N/A 06/26/2022   Procedure: RIGHT HEART CATH AND CORONARY/GRAFT ANGIOGRAPHY;  Surgeon: [name], MD;  Location: [facility] INVASIVE CV LAB;  Service: Cardiovascular;  Laterality: N/A;   SUBMUCOSAL INJECTION  09/23/2017   Procedure: SUBMUCOSAL INJECTION;  Surgeon: Graylin Shiver, MD;  Location: [facility] ENDOSCOPY;  Service: Endoscopy;;  in colon   TEE  WITHOUT CARDIOVERSION N/A 09/13/2020   Procedure: TRANSESOPHAGEAL ECHOCARDIOGRAM (TEE);  Surgeon: Alleen Borne, MD;  Location: [facility];  Service: Open Heart Surgery;  Laterality: N/A;   TUBAL LIGATION  1990     Social History:   reports that she quit smoking about 2 years ago. Her smoking use included cigarettes. She started smoking about 42 years ago. She has a 20 pack-year smoking history. She has never used smokeless tobacco. She reports current alcohol use. She reports that she does not use drugs.   Family History:  Her family history includes Arthritis in her daughter and daughter; Asthma in her daughter; Diabetes in her sister; Heart disease in her sister and another family member; Lung cancer in her mother; Thyroid cancer in an other family member.   Allergies Allergies  Allergen Reactions   3-Methyl-2-Benzothiazolinone Hydrazone Other (See Comments)    Muscle weakness muscle cramping in legs Other reaction(s): Myalgias (Muscle Pain)   Banana Anaphylaxis, Swelling and Other (See Comments)    TONGUE AND MOUTH SWELLING   Black Buyer, retail (Non-Screening) Anaphylaxis and Itching    Walnuts   Hazelnut (Filbert) Anaphylaxis    Hazelnuts   Leflunomide And Related Other (See Comments)    Severe Headache   Lisinopril Swelling    Angioedema  Swelling of the face   No Healtht[employer] Food Allergies Anaphylaxis    Spicy Mustard 4.7.2021 Pt reports that she eats Regular Yellow Mustard Swelling and itching of tongue   Other Anaphylaxis, Swelling and Other (See Comments)    Cosentyx= angioedema Mouth itches and tongue swelling Hazel nuts and pecans also Walnuts Renal failure Serotonin syndrome  Tremors Muscle weakness muscle cramping in legs Other reaction(s): Myalgias (Muscle Pain)   Pecan Nut (Diagnostic) Anaphylaxis and Itching    Pecans   Trazodone And Nefazodone Other (See Comments)    Serotonin Syndrome  Tremors   Adalimumab Other (See Comments)    Blisters on  abdomen =humira   Escitalopram Oxalate Other (See Comments)    Hand problems - Serotonin Syndrome  Ezetimibe Other (See Comments)    myalgias cramps    Secukinumab Swelling    Cosentyx = angiodema    Statins Other (See Comments)    Leg pains and weakness   Ferrlecit [Na Ferric Gluc Cplx In Sucrose]     Not reaction given from HD   Gabapentin Other (See Comments)    tremors   Nsaids     Avoid due to renal problems      Home Medications  Prior to Admission medications   Medication Sig Start Date End Date Taking? Authorizing Provider  acetaminophen (TYLENOL) 500 MG tablet Take 1,000 mg by mouth daily as needed (pain).   Yes [provider]  allopurinol (ZYLOPRIM) 100 MG tablet Take 100 mg by mouth every evening.   Yes [provider]  Artificial Tear Solution (TEARS NATURALE OP) Place 1 drop into both eyes 4 (four) times daily as needed (dry eye).   Yes [provider]  aspirin 81 MG chewable tablet Chew 1 tablet (81 mg total) by mouth every other day. 01/07/23  Yes Monna Fam, MD  busPIRone (BUSPAR) 5 MG tablet Take 5 mg by mouth 3 (three) times daily. 01/01/23 01/01/24 Yes [provider]  carvedilol (COREG) 25 MG tablet Take 1 tablet (25 mg total) by mouth 2 (two) times daily with a meal. 12/25/22  Yes Janetta Hora, PA-C  Cholecalciferol (VITAMIN D) 50 MCG (2000 UT) tablet Take 2,000 Units by mouth daily.   Yes [provider]  diclofenac Sodium (VOLTAREN) 1 % GEL Apply 2 g topically daily as needed (pain).   Yes [provider]  diphenhydrAMINE (BENADRYL) 25 mg capsule Take 25 mg by mouth every 6 (six) hours as needed for sleep.   Yes [provider]  docusate sodium (COLACE) 100 MG capsule Take 100 mg by mouth daily as needed for moderate constipation.   Yes [provider]  EPINEPHrine 0.3 mg/0.3 mL IJ SOAJ injection Inject into the muscle as needed for anaphylaxis. 07/12/21  Yes [provider]  fluticasone (FLONASE) 50 MCG/ACT nasal spray Place 1 spray into both nostrils daily as needed for allergies or rhinitis.   Yes [provider]  isosorbide mononitrate (IMDUR) 60 MG 24 hr tablet Take 1 tablet (60 mg total) by mouth daily. 10/27/22  Yes Jake Bathe, MD  LORazepam (ATIVAN) 0.5 MG tablet Take 0.5 mg by mouth as needed for anxiety. Take as breakthrough medication to buspirone.   Yes [provider]  montelukast (SINGULAIR) 10 MG tablet Take 10 mg by mouth daily with supper.    Yes [provider]  multivitamin (RENA-VIT) TABS tablet Take 1 tablet by mouth daily with supper.   Yes [provider]  NIFEdipine (PROCARDIA XL/NIFEDICAL XL) 60 MG 24 hr tablet Take 60 mg by mouth 2 (two) times daily. 10/01/21  Yes [provider]  olmesartan (BENICAR) 40 MG tablet Take 40 mg by mouth every evening. 05/06/19  Yes [provider]  ondansetron (ZOFRAN) 4 MG tablet Take 4 mg by mouth 2 (two) times daily as needed for nausea or vomiting.   Yes [provider]  OXYGEN Inhale 2-3 L into the lungs continuous.   Yes [provider]  pantoprazole (PROTONIX) 40 MG tablet Take 40 mg by mouth 2 (two) times daily. 06/09/20  Yes [provider]  predniSONE (DELTASONE) 5 MG tablet Take 1 tablet (5 mg total) by mouth daily with breakfast. 02/27/22  Yes Marrianne Mood, MD  Probiotic  Product (PROBIOTIC PO) Take 1 capsule by mouth daily.   Yes [provider]  Tiotropium Bromide-Olodaterol (STIOLTO RESPIMAT) 2.5-2.5 MCG/ACT AERS Inhale 2 puffs once daily into the lungs. Patient taking differently: Inhale 2 puffs into the lungs daily as needed (SOB Wheezing). 11/28/21  Yes Hunsucker, Lesia Sago, MD  VENTOLIN HFA 108 (90 Base) MCG/ACT inhaler Inhale 2 puffs into the lungs See admin instructions. Take 2 puffs every 4-6 hours as needed for wheezing and shortness of breath 04/08/22  Yes Hunsucker, Lesia Sago, MD   zolpidem (AMBIEN) 5 MG tablet Take 5 mg by mouth at bedtime as needed for sleep. 02/04/21  Yes [provider]  albuterol (PROVENTIL) (2.5 MG/3ML) 0.083% nebulizer solution Take 3 mLs (2.5 mg total) by nebulization every 6 (six) hours as needed for wheezing or shortness of breath. 07/10/21   Hunsucker, Lesia Sago, MD  cetirizine (ZYRTEC) 10 MG tablet Take 10 mg by mouth daily as needed for allergies. Patient not taking: Reported on 04/20/2023    [provider]  hydrocortisone 2.5%-nystatin-zinc oxide 20% 1:1:1 ointment mixture Apply topically daily as needed. 01/07/23   Monna Fam, MD  silver sulfADIAZINE (SILVADENE) 1 % cream Apply 1 Application topically daily as needed (to prevent wound). Patient not taking: Reported on 04/20/2023    [provider]     Critical care time: 60 minutes     Joneen Roach, AGACNP-BC [facility]  See Amion for personal pager PCCM on call pager [phone] until 7pm. Please call Elink 7p-7a. [phone]  04/24/2023 12:08 PM

## 2023-04-24 NOTE — Progress Notes (Signed)
 PROGRESS NOTE Jodi Bell  WUJ:[id number] DOB: [birth year] DOA: 04/20/2023 PCP: Genelle Gather, MD     Brief Narrative/Hospital Course: 63 year old female with ESRD on HD MWF HTN, COPD/asthma T2DM OSA PAD/history of TAVR 12/2022, history of GI bleed, sleep apnea anxiety/chronic insomnia sent to the ED from dialysis center due to hypotension and shortness of breath, dialysis was started mid treatment due to low blood pressure and patient having nausea and dry heaving and feeling short of breath although not hypoxic and patient is concerned she might lose control of her bowels.  She had this similar episodes last few PRN treatments including Friday.  Patient had recent dental work, has not been eating well and has lost weight-her weight in November was 68 kg and in feb 55kg. Patient underwent further workup vitals showed hypotensive 70s improved with fluid bolus in the ED, got a dose of midodrine placed on supplemental oxygen, labs with hypokalemia will close 68 creatinine 6.5 albumin 2.3 normal LFTs lactic acid, and had leukocytosis 13.8 and thrombocytopenia 62K INR 1.7.  Chest x-ray unremarkable  Significant procedure/imaging: Echo 02/02/23 post TAVR>There is severely elevated pulmonary artery systolic pressure. LVEF 60-65%.   CT chest abdomen pelvis without contrast 3/10:Slight ascending colon wall thickening new from 03/31/2023 difficult to exclude colitis trace pleural effusion and dependent atelectasis, small ascites small pulmonary nodules stable aortic atherosclerosis enlarged pulmonary trunk indicating PAH.  TTE 04/21/23: LVEF55-60%+g2dd,Severely elevated pulmonary artery systolic pressure.  Bioprosthetic aortic valve.There does appear to  be significant bioprosthetic aortic   valve stenosis, similar gradient to prior echo   Consultation: Nephrology Infectious disease   Subjective: Patient seen and examined this morning in dialysis s/p cardiac arrest  with hypotension in HD in 60s and  brief loss of pulse> recessively multiple rounds of epinephrine, atropine sodium bicarb dextrose, intubated and transferred to ICU. Overnight afebrile BP stable in 130-150s. On 3 L [place] Labs reviewed sodium 132>128 wbc 33>24K, hb stable 11.9>11.7 gm Earlier D5 water stopped due to risk of fluid overload in ESRD setting She had black stool this morning-hem occult was ordered pending  Assessment and Plan: Principal Problem:   Hypotension Active Problems:   Non-insulin dependent type 2 diabetes mellitus (HCC)   Essential hypertension   COPD (chronic obstructive pulmonary disease) (HCC)   Tobacco use   GI bleeding   Coronary artery disease involving coronary bypass graft of native heart with angina pectoris (HCC)   Paroxysmal atrial fibrillation (HCC)   Acute on chronic respiratory failure with hypoxia (HCC)   Thrombocytopenia (HCC)   ESRD on dialysis (HCC)   Cirrhosis of liver (HCC)   Eosinophilia  Cardiac arrest with hypotension in HD: Appeared to  have sustained PEA cardiac arrest w/ BP in 60s- ?  Fluid shift related, not tolerating HD. Lost pulses briefly few few seconds ~ 15s and successful ROSC, intubated and transferred to ICU for further care per PCCM, suspect aspiration during code BP was stable postresuscitation, airway secured with intubation.Signed out to critical care-further care per critical care.  Hypotension: Hypotension felt to be multifactorial during outpatient HD, in ttriage BP in 76.  Likely due to ESRD/fluid shift, anti-hypertensive/poor intake- PTA on clonidine/Coreg/Imdur/nifedipine, and also recent weight loss due to dental extraction and decreased oral intake. BP had improved with IV fluids and midodrine x1. Echo with EF 55-60% G2 DD RV function mildly reduced RV size mildly enlarged and unchanged severely elevated pulmonary artery systolic pressure as her echo form 01/2023 post TAVR.  ESRD on HD MWF:  Nephro following for HD.  Patient only had 1 hour of dialysis  stopped due to hypotension- and admitted on 3/10 Patient was getting HD and became hypotensive today  and cardiac arrest  COPD Chronic respiratory failure-saturating well on 2-3  [place]- home setting Severe pulmonary hypertension: continue inhaler as needed, and home tiotropium Singulair.   Recent weight loss Suspect FTT: Weight on 03/30/23: 69.7KG and this admit 55kg 3/11> 63.2kg.  Augment diet as able.  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 Cxr no PNA on admission.  Has fistula for HD. Unable to get UA despite in and out in ED. CT abdomen difficult to exclude colitis? - but no significant diarrhea to obntain C. difficile and GIP. Blackish stool this morning Hemoccult pending. She had  CTA to r/o mesenteric ischemia 3/13-showed with perfusion defect in the superior spleen suggesting infarct, no proximal vessel arterial occlusive disease proximal and mid small bowel thickening and enhancement suggesting enteritis. ID and oncology has been following- ?steroid initiation.  Parasites workup in process. Planning for TEE, holding off on antibiotics per ID.  Liver cirrhosis hx-but CT does not report any abnormal liver finding Thrombocytopenia Chronic anemia from renal disease/chronic disease/history of GI bleeding: Trace ascites and pleural effusion on imaging.Has low albumin likely contributing, LFTs stable Baseline platelet 80-100k and baseline hb~8-9 gm in Feb 2025. Hemoglobin stable 10-11.  Platelet count  in 44K. Recent Labs  Lab 04/23/23 0204 04/23/23 2126 04/24/23 0419  HGB 12.2 11.9* 11.7*  HCT 39.6 38.1 37.8  WBC 20.6* 33.0* 24.9*  PLT 46* 46* 44*     Hypokalemia: Resolved.   PAF: She had post op a fib per cardio note 06/05/22,Not on  anticoagulation. Has hx of GI bleeding  Elevated troponins 182>151 in ED-her baseline  troponin chronically elevated in 190s-230s. CAD S/P CABG 2022 HX of TAVR June 2024 Chronic diastolic CHF: Patient without chest pain on admission. Hypotensive in HD as outpatient and soft bp in ED-elevated troponin likely from demand ischemia Echo-normal EF G2 DD, No WMA, and w/ chronic severe pulmonary hypertension . Aspirin held on admission due to thrombocytopenia.  CT showed debris's and air in the dilated esophagus indicating dysmotility History of GI bleeding-dailofou lesions, capsule endo in 03/2021 She had EGD in the past needing  APC.no evidence of bleeding currently.  Hemoccult pending See CTA above. Hb has been stable.  T2DM W/ borderline blood glucose asymptomatic in 60s: Intermittent borderline blood glucose level , needing D5 water stopped. Monitor  Recent Labs  Lab 04/24/23 0005 04/24/23 0035 04/24/23 0231 04/24/23 0534 04/24/23 1139  GLUCAP 60* 99 78 78 55*    Gout Psoriatic arthritis GAD: Cont allopurinol.On prednisone low dose before-per med rec last dose was last month.   DVT prophylaxis: SCDs Start: 04/20/23 1921 Code Status:   Code Status: Full Code Family Communication: None at bedside Patient status is: Remains hospitalized because of severity of illness patient is critically ill and transferred to ICU.  Level of care: ICU  Dispo: The patient is from: Home.            Anticipated disposition: TBD  Objective: Vitals last 24 hrs: Vitals:   04/24/23 1121 04/24/23 1133 04/24/23 1136 04/24/23 1144  BP:  (!) 73/61 (!) 58/50   Pulse:      Resp:  (!) 23 (!) 26   Temp:    97.8 F (36.6  C)  TempSrc:    Axillary  SpO2: 100%     Weight:      Height:       Weight change: -0.2 kg  Physical Examination: General exam: Unresponsive, on ET tube, chronically ill looking and frail. HEENT:Oral mucosa dryt, Ear/Nose WNL grossly. Respiratory system: Bilaterally clear BS,no use of accessory muscle. Cardiovascular system: S1 & S2 +, No JVD. Gastrointestinal  system: Abdomen soft, distention present.  Nervous System: Unresponsive. Extremities: LE edema mild. Skin: No rashes,no icterus. MSK: thin and small muscle bulk,tone, power.   Medications reviewed:  Scheduled Meds:  arformoterol  15 mcg Nebulization BID   And   umeclidinium bromide  1 puff Inhalation Daily   busPIRone  5 mg Oral TID   Chlorhexidine Gluconate Cloth  6 each Topical Q0600   cinacalcet  60 mg Oral Once per day on Monday Friday   And   cinacalcet  90 mg Oral Every Wednesday   dextrose       docusate  100 mg Per Tube BID   famotidine  10 mg Per Tube Daily   ivermectin  200 mcg/kg Oral Once   pantoprazole (PROTONIX) IV  40 mg Intravenous Q12H   polyethylene glycol  17 g Per Tube Daily   sodium chloride flush  3 mL Intravenous Q12H   vasopressin      Continuous Infusions:  sodium chloride     amiodarone     lactated ringers     norepinephrine (LEVOPHED) Adult infusion 30 mcg/min (04/24/23 1140)      Diet Order             Diet NPO time specified  Diet effective now                  Intake/Output Summary (Last 24 hours) at 04/24/2023 1152 Last data filed at 04/23/2023 2000 Gross per 24 hour  Intake 30 ml  Output 0 ml  Net 30 ml   Net IO Since Admission: -170 mL [04/24/23 1152]  Wt Readings from Last 3 Encounters:  04/24/23 63.2 kg  04/01/23 55.4 kg  01/29/23 64 kg     Data Reviewed: I have personally reviewed following labs and imaging studies CBC: Recent Labs  Lab 04/22/23 0546 04/22/23 1055 04/23/23 0204 04/23/23 2126 04/24/23 0419  WBC 28.0* 27.8* 20.6* 33.0* 24.9*  NEUTROABS  --  1.6*  --  5.3  --   HGB 11.0* 11.9* 12.2 11.9* 11.7*  HCT 35.7* 38.0 39.6 38.1 37.8  MCV 97.3 96.2 95.4 96.2 95.2  PLT 48* 53* 46* 46* 44*  Basic Metabolic Panel:  Recent Labs  Lab 04/20/23 0950 04/20/23 1010 04/21/23 0450 04/22/23 0546 04/23/23 0204 04/24/23 0419  NA 138 135 131* 131* 132* 128*  K 3.2* 3.2* 3.2* 3.9 3.4* 4.0  CL 101 100 104 98 96*  94*  CO2 27  --  24 24 26 26   GLUCOSE 68* 64* 80 64* 64* 88  BUN 13 14 15 20 13 20   CREATININE 6.51* 7.30* 7.13* 8.16* 6.19* 7.23*  CALCIUM 9.8  --  8.2* 9.1 8.3* 8.0*  PHOS  --   --  3.1  --   --   --   GFR: Estimated Creatinine Clearance: 7 mL/min (A) (by C-G formula based on SCr of 7.23 mg/dL (H)). Liver Function Tests:  Recent Labs  Lab 04/20/23 0937 04/21/23 0450  AST 19 15  ALT 9 8  ALKPHOS 81 70  BILITOT 0.8 0.4  PROT 5.3*  4.2*  ALBUMIN 2.3* 2.0*  Sepsis Labs: Recent Labs  Lab 04/20/23 1010 04/21/23 0450  PROCALCITON  --  2.65  LATICACIDVEN 1.6  --    Recent Results (from the past 240 hours)  Blood Culture (routine x 2)     Status: None (Preliminary result)   Collection Time: 04/20/23  9:14 AM   Specimen: BLOOD RIGHT HAND  Result Value Ref Range Status   Specimen Description BLOOD RIGHT HAND  Final   Special Requests   Final    BOTTLES DRAWN AEROBIC ONLY Blood Culture results may not be optimal due to an inadequate volume of blood received in culture bottles   Culture   Final    NO GROWTH 4 DAYS Performed at [facility], [address], [place], [place] [address]    Report Status PENDING  Incomplete  Blood Culture (routine x 2)     Status: None (Preliminary result)   Collection Time: 04/20/23  9:37 AM   Specimen: BLOOD RIGHT ARM  Result Value Ref Range Status   Specimen Description BLOOD RIGHT ARM  Final   Special Requests   Final    BOTTLES DRAWN AEROBIC AND ANAEROBIC Blood Culture results may not be optimal due to an inadequate volume of blood received in culture bottles   Culture   Final    NO GROWTH 4 DAYS Performed at [facility], [address], [place], [place] [address]    Report Status PENDING  Incomplete      Total time spent in review of labs and imaging, patient evaluation, formulation of plan, documentation and communication with family:  50 minutes  Lanae Boast, DO  [facility]  04/24/2023, 11:52 AM

## 2023-04-24 NOTE — Progress Notes (Addendum)
 eLink Physician-Brief Progress Note Patient Name: NIKOL LEMAR DOB: [birth year] MRN: [id number]   Date of Service  04/24/2023  HPI/Events of Note  63 year old female with coronary artery disease status post CABG, NSAID use on hemodialysis on chronic respiratory failure with hypoxia on 2 L oxygen who initially presented with generalized weakness and hypotension during dialysis, considered to be due to antihypertensive meds, course was complicated with abdominal pain, CTA showed enteritis/colitis   Currently ventilated after cardiac arrest earlier this morning.  Asking for something for sedation as only PRNs are ordered.  eICU Interventions  Used fentanyl and as needed Versed as first-line as already ordered.  As a backup, can utilize Precedex for RASS goal of 0.   0348 -(repleted hypoglycemic episodes.  Will initiate D10 infusion.  Intervention Category Minor Interventions: Agitation / anxiety - evaluation and management  Jadan Hinojos 04/24/2023, 7:53 PM

## 2023-04-24 NOTE — Progress Notes (Addendum)
 [facility] Progress Note   Subjective:   Noted hypoglycemia overnight. Pt seated on bedside, reports she is SOB when she lays flat. Denies CP, dizziness, nausea. Perseverating a bit, says "we need to get rid of that," repeatedly referring to her shortness of breath. O2 sat 100% on 3L O2. Getting ready to go to HD now.   Objective Vitals:   04/23/23 2000 04/23/23 2052 04/24/23 0321 04/24/23 0500  BP: 137/78  (!) 147/75   Pulse: 92  91   Resp: 18     Temp: 99.3 F (37.4 C)  98.3 F (36.8 C)   TempSrc: Oral  Oral   SpO2: 99% 98% 100%   Weight:    67 kg  Height:       Physical Exam General: Alert female, NAD, on O2 via [place] Heart: RRR, no murmurs, rubs or gallops Lungs: Diminished breath sounds in bases, respirations unlabored on O2 Abdomen: Soft, non-distended, +BS Extremities: trace edema b/l lower extremities Dialysis Access:  LUE AVF + bruit  Additional Objective Labs: Basic Metabolic Panel: Recent Labs  Lab 04/21/23 0450 04/22/23 0546 04/23/23 0204 04/24/23 0419  NA 131* 131* 132* 128*  K 3.2* 3.9 3.4* 4.0  CL 104 98 96* 94*  CO2 24 24 26 26   GLUCOSE 80 64* 64* 88  BUN 15 20 13 20   CREATININE 7.13* 8.16* 6.19* 7.23*  CALCIUM 8.2* 9.1 8.3* 8.0*  PHOS 3.1  --   --   --    Liver Function Tests: Recent Labs  Lab 04/20/23 0937 04/21/23 0450  AST 19 15  ALT 9 8  ALKPHOS 81 70  BILITOT 0.8 0.4  PROT 5.3* 4.2*  ALBUMIN 2.3* 2.0*   No results for input(s): "LIPASE", "AMYLASE" in the last 168 hours. CBC: Recent Labs  Lab 04/22/23 0546 04/22/23 1055 04/23/23 0204 04/23/23 2126 04/24/23 0419  WBC 28.0* 27.8* 20.6* 33.0* 24.9*  NEUTROABS  --  1.6*  --  5.3  --   HGB 11.0* 11.9* 12.2 11.9* 11.7*  HCT 35.7* 38.0 39.6 38.1 37.8  MCV 97.3 96.2 95.4 96.2 95.2  PLT 48* 53* 46* 46* 44*   Blood Culture    Component Value Date/Time   SDES BLOOD RIGHT ARM 04/20/2023 0937   SPECREQUEST  04/20/2023 1610    BOTTLES DRAWN AEROBIC AND ANAEROBIC  Blood Culture results may not be optimal due to an inadequate volume of blood received in culture bottles   CULT  04/20/2023 0937    NO GROWTH 4 DAYS Performed at [facility], [address], [place], [place] [address]    REPTSTATUS PENDING 04/20/2023 4098    Cardiac Enzymes: No results for input(s): "CKTOTAL", "CKMB", "CKMBINDEX", "TROPONINI" in the last 168 hours. CBG: Recent Labs  Lab 04/23/23 2101 04/24/23 0005 04/24/23 0035 04/24/23 0231 04/24/23 0534  GLUCAP 76 60* 99 78 78   Iron Studies: No results for input(s): "IRON", "TIBC", "TRANSFERRIN", "FERRITIN" in the last 72 hours. @lablastinr3 @ Studies/Results: CT Angio Abd/Pel w/ and/or w/o Result Date: 04/23/2023 CLINICAL DATA:  Acute mesenteric ischemia, colitis EXAM: CTA ABDOMEN AND PELVIS WITHOUT AND WITH CONTRAST TECHNIQUE: Multidetector CT imaging of the abdomen and pelvis was performed using the standard protocol during bolus administration of intravenous contrast. Multiplanar reconstructed images and MIPs were obtained and reviewed to evaluate the vascular anatomy. RADIATION DOSE REDUCTION: This exam was performed according to the departmental dose-optimization program which includes automated exposure control, adjustment of the mA and/or kV according to patient size and/or  use of iterative reconstruction technique. CONTRAST:  80mL OMNIPAQUE IOHEXOL 350 MG/ML SOLN COMPARISON:  04/20/2023 FINDINGS: VASCULAR Aorta: Moderate calcified atheromatous plaque. No aneurysm, dissection, or stenosis. Celiac: Patent without evidence of aneurysm, dissection, vasculitis or significant stenosis. SMA: Patent without evidence of aneurysm, dissection, vasculitis or significant stenosis. Renals: Single bilaterally, both with calcified ostial plaque resulting in only mild stenosis, patent distally. IMA: Patent without evidence of aneurysm, dissection, vasculitis or significant stenosis. Inflow: Mild calcified plaque in bilateral common and  internal iliac arteries. No aneurysm, dissection, or stenosis. Proximal Outflow: Minimally atheromatous, patent. Veins: Patent hepatic veins, portal vein, SMV. Incomplete opacification of the iliac venous system, IVC, and renal veins. Review of the MIP images confirms the above findings. NON-VASCULAR Lower chest: Trace pleural effusions right greater than left. Dependent atelectasis in the lung bases, worse right. Hepatobiliary: Gallbladder physiologically distended. Small subcentimeter hepatic cysts. No worrisome liver lesion or biliary ductal dilatation. Pancreas: Unremarkable. No pancreatic ductal dilatation or surrounding inflammatory changes. Spleen: Normal in size. Wedge-shaped perfusion defect superiorly, new since 03/10/2023. Adrenals/Urinary Tract: Significantly decreased renal parenchymal enhancement. Extensive renal arterial vascular calcifications. Multiple cortical lesions in both kidneys, some of which can be characterized as cysts, largest 3.1 cm 5 HU right lower pole; no followup recommended. Urinary bladder nondistended. Stomach/Bowel: Stomach is distended by gas and fluid without acute finding. Circumferential wall thickening with heterogenous enhancement in proximal and mid small bowel loops with which remain nondilated. No pneumatosis. The more distal small bowel is unremarkable. Normal appendix. The colon is partially distended, with scattered diverticula in ascending and descending segments, without adjacent inflammatory change. Lymphatic: No abdominal or pelvic adenopathy. Reproductive: Uterus and bilateral adnexa are unremarkable. Other: Trace pelvic, perihepatic and perisplenic ascites. No free air. Musculoskeletal: Post PLIF L3-4. chronic erosive and destructive changes L2-3 with some loss of height. Coarse diffuse osseous sclerosis at all the levels, chronic. IMPRESSION: 1. No proximal visceral arterial occlusive disease to suggest an etiology of occlusive mesenteric ischemia. 2. Proximal  and mid small bowel wall thickening and enhancement, suggesting enteritis. 3. Wedge-shaped perfusion defect in the superior spleen, new since 03/10/2023, suggesting infarct. 4. Significantly decreased renal parenchymal enhancement, suggesting renal insufficiency. 5. Trace pleural effusions and ascites. 6.  Aortic Atherosclerosis (ICD10-I70.0). Electronically Signed   By: Corlis Leak M.D.   On: 04/23/2023 20:39   Medications:  dextrose 75 mL/hr at 04/24/23 0245    arformoterol  15 mcg Nebulization BID   And   umeclidinium bromide  1 puff Inhalation Daily   busPIRone  5 mg Oral TID   Chlorhexidine Gluconate Cloth  6 each Topical Q0600   cinacalcet  60 mg Oral Once per day on Monday Friday   And   cinacalcet  90 mg Oral Every Wednesday   pantoprazole (PROTONIX) IV  40 mg Intravenous Q12H   sodium chloride flush  3 mL Intravenous Q12H    Dialysis Orders: [facility] MWF - records have been requested  Assessment/Plan: 1. Hypotension - History of being on multiple blood pressure medications. Possibly overtreated. Holding blood pressure medications for now, BP now slightly elevated, continue to hold meds to facilitate volume removal with HD 2. ESRD - HD MWF. Continue on schedule. HD Friday  3. Hypokalemia - Improved. K+ 4.0 today.  4. Volume- UF limited by hypotension. Tolerates 1-2L UF. Will attempt to challenge today if BP tolerates since she is SOB 5. Anemia- Hgb  11-12. No ESA indication.   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  Leukocytosis - Blood Cx NGTD. Per primary. ID consulted.  8. Thrombocytopenia/Hx liver cirrhosis: Chronic issue. Per primary. No heparin with HD.  9.  COPD/Chronic respiratory failure  - 2-3L [place] at baseline per patient   Addendum: Prior to HD, patient's BP was 90's systolic and she was alert and oriented. Approved to start HD with UF goal of only 1L as long as BP stayed stable and mentation was stable. BP initially improved during HD however patient suddenly became  unresponsive. Report received from HD charge RN who reports last BP in the 120's systolic prior to this event. RN gave IVF and place pt in trendelenburg with no improvement, RR then code team called. RN reports loss of pulse for about 15 seconds. Patient was approx. 1 hour into treatment with minimal UF removed according to HD RN. Transferred to ICU, Dr. Valentino Nose updated.   Rogers Blocker, PA-C 04/24/2023, 8:49 AM  [facility] Pager: [phone]

## 2023-04-24 NOTE — Progress Notes (Signed)
 Pharmacy Antibiotic Note  Jodi Bell is a 63 y.o. female admitted on 04/20/2023 with  multifactorial hypotension .  Pharmacy has been consulted for pip-tazo dosing in setting of massive aspiration during cardiac arrest, profound shock while receiving hemodialysis. Did not tolerate HD well at previous session, will proceed with dosing CrCl <10 until nephrology plan can be determined as patient is unlikely to tolerate renal replacement therapy at this time.  Plan: Pip-tazo 2.25 IV q6h F/u clinical improvement, duration of therapy, renal plan ID following, f/u recommendations and diagnosis  Height: 5\' 2"  (157.5 cm) Weight: 63.2 kg (139 lb 5.3 oz) (bed) IBW/kg (Calculated) : 50.1  Temp (24hrs), Avg:98.4 F (36.9 C), Min:97.8 F (36.6 C), Max:99.3 F (37.4 C)  Recent Labs  Lab 04/20/23 1010 04/21/23 0450 04/22/23 0546 04/22/23 1055 04/23/23 0204 04/23/23 2126 04/24/23 0419 04/24/23 1224  WBC  --  27.6* 28.0* 27.8* 20.6* 33.0* 24.9* 6.1  CREATININE 7.30* 7.13* 8.16*  --  6.19*  --  7.23* 6.09*  LATICACIDVEN 1.6  --   --   --   --   --   --   --     Estimated Creatinine Clearance: 8.4 mL/min (A) (by C-G formula based on SCr of 6.09 mg/dL (H)).    Allergies  Allergen Reactions   3-Methyl-2-Benzothiazolinone Hydrazone Other (See Comments)    Muscle weakness muscle cramping in legs Other reaction(s): Myalgias (Muscle Pain)   Banana Anaphylaxis, Swelling and Other (See Comments)    TONGUE AND MOUTH SWELLING   Black Buyer, retail (Non-Screening) Anaphylaxis and Itching    Walnuts   Hazelnut ([place]) Anaphylaxis    Hazelnuts   Leflunomide And Related Other (See Comments)    Severe Headache   Lisinopril Swelling    Angioedema  Swelling of the face   No Healthtouch Food Allergies Anaphylaxis    Spicy Mustard 4.7.2021 Pt reports that she eats Regular Yellow Mustard Swelling and itching of tongue   Other Anaphylaxis, Swelling and Other (See Comments)    Cosentyx=  angioedema Mouth itches and tongue swelling Hazel nuts and pecans also Walnuts Renal failure Serotonin syndrome  Tremors Muscle weakness muscle cramping in legs Other reaction(s): Myalgias (Muscle Pain)   Pecan Nut (Diagnostic) Anaphylaxis and Itching    Pecans   Trazodone And Nefazodone Other (See Comments)    Serotonin Syndrome  Tremors   Adalimumab Other (See Comments)    Blisters on abdomen =humira   Escitalopram Oxalate Other (See Comments)    Hand problems - Serotonin Syndrome     Ezetimibe Other (See Comments)    myalgias cramps    Secukinumab Swelling    Cosentyx = angiodema    Statins Other (See Comments)    Leg pains and weakness   Ferrlecit [Na Ferric Gluc Cplx In Sucrose]     Not reaction given from HD   Gabapentin Other (See Comments)    tremors   Nsaids     Avoid due to renal problems     Antimicrobials this admission: Zosyn 3/14 >> Doxy 3/14 (?ehrliciosis) >> Previously planned for ivermectin 3/14  Microbiology results: 3/10 BCx: ng 4d 3/14 Ova/Parasite Stool: 3/14 MRSA PCR:   Thank you for allowing pharmacy to be a part of this patient's care.  Rutherford Nail, PharmD PGY2 Critical Care Pharmacy Resident 04/24/2023 2:42 PM

## 2023-04-24 NOTE — Procedures (Signed)
 Patient BP became low. 64/27.  Attempted to get another BP by moving cuff to a different location. Patient became unresponsive, MD at bedside. CN notified and at bedside. Sternal rubbed perform, unable to rouse patient. Placed in trendelenburg. Full code called. Code team came to bedside.

## 2023-04-24 NOTE — Progress Notes (Signed)
   04/24/23 1200  Spiritual Encounters  Type of Visit Initial  Care provided to: Patient  Conversation partners present during encounter Physician;Nurse  Referral source Nurse (RN/NT/LPN);Physician;Clinical staff  Reason for visit Code  OnCall Visit No  Spiritual Framework  Presenting Themes Meaning/purpose/sources of inspiration;Rituals and practive  Patient Stress Factors None identified  Family Stress Factors None identified  Interventions  Spiritual Care Interventions Made Established relationship of care and support;Prayer   Chaplain met patient at bedside during CODE BLUE event this afternoon.  She was attended to by Res. Team and Chaplain prayed and provided Ministry of Presence.

## 2023-04-24 NOTE — Progress Notes (Signed)
 OT Cancellation Note  Patient Details Name: Jodi Bell MRN: [id number] DOB: 13-Oct-1960   Cancelled Treatment:    Reason Eval/Treat Not Completed: Patient at procedure or test/ unavailable.  Off the floor for HD.  [name] D [name] 04/24/2023, 10:12 AM 04/24/2023  RP, OTR/L  [facility]  Office:  [phone]

## 2023-04-24 NOTE — Progress Notes (Addendum)
 Hypoglycemic Event  CBG: 60  Treatment: D50 25 mL (12.5 gm)  Symptoms: None  Follow-up CBG: Time:15 CBG Result:99  Possible Reasons for Event: Inadequate meal intake  Comments/MD notified: NP Chavez notified, orders to recheck in 2 hours received.     Jodi Bell Session

## 2023-04-25 DIAGNOSIS — N186 End stage renal disease: Secondary | ICD-10-CM | POA: Diagnosis not present

## 2023-04-25 DIAGNOSIS — R579 Shock, unspecified: Secondary | ICD-10-CM | POA: Diagnosis not present

## 2023-04-25 DIAGNOSIS — I469 Cardiac arrest, cause unspecified: Secondary | ICD-10-CM | POA: Diagnosis not present

## 2023-04-25 DIAGNOSIS — J9601 Acute respiratory failure with hypoxia: Secondary | ICD-10-CM | POA: Diagnosis not present

## 2023-04-25 LAB — BLOOD GAS, ARTERIAL
Acid-base deficit: 6.5 mmol/L — ABNORMAL HIGH (ref 0.0–2.0)
Bicarbonate: 16.5 mmol/L — ABNORMAL LOW (ref 20.0–28.0)
Drawn by: 137461
O2 Saturation: 99.2 %
Patient temperature: 38.5
pCO2 arterial: 28 mmHg — ABNORMAL LOW (ref 32–48)
pH, Arterial: 7.39 (ref 7.35–7.45)
pO2, Arterial: 158 mmHg — ABNORMAL HIGH (ref 83–108)

## 2023-04-25 LAB — CULTURE, BLOOD (ROUTINE X 2)
Culture: NO GROWTH
Culture: NO GROWTH

## 2023-04-25 LAB — CBC
HCT: 33.4 % — ABNORMAL LOW (ref 36.0–46.0)
Hemoglobin: 10.9 g/dL — ABNORMAL LOW (ref 12.0–15.0)
MCH: 29.6 pg (ref 26.0–34.0)
MCHC: 32.6 g/dL (ref 30.0–36.0)
MCV: 90.8 fL (ref 80.0–100.0)
Platelets: 37 10*3/uL — ABNORMAL LOW (ref 150–400)
RBC: 3.68 MIL/uL — ABNORMAL LOW (ref 3.87–5.11)
RDW: 19.9 % — ABNORMAL HIGH (ref 11.5–15.5)
WBC: 5.7 10*3/uL (ref 4.0–10.5)
nRBC: 0.4 % — ABNORMAL HIGH (ref 0.0–0.2)

## 2023-04-25 LAB — GLUCOSE, CAPILLARY
Glucose-Capillary: 115 mg/dL — ABNORMAL HIGH (ref 70–99)
Glucose-Capillary: 118 mg/dL — ABNORMAL HIGH (ref 70–99)
Glucose-Capillary: 167 mg/dL — ABNORMAL HIGH (ref 70–99)
Glucose-Capillary: 210 mg/dL — ABNORMAL HIGH (ref 70–99)
Glucose-Capillary: 28 mg/dL — CL (ref 70–99)
Glucose-Capillary: 38 mg/dL — CL (ref 70–99)
Glucose-Capillary: 83 mg/dL (ref 70–99)
Glucose-Capillary: 95 mg/dL (ref 70–99)

## 2023-04-25 LAB — BASIC METABOLIC PANEL
Anion gap: 14 (ref 5–15)
BUN: 19 mg/dL (ref 8–23)
CO2: 17 mmol/L — ABNORMAL LOW (ref 22–32)
Calcium: 7.7 mg/dL — ABNORMAL LOW (ref 8.9–10.3)
Chloride: 95 mmol/L — ABNORMAL LOW (ref 98–111)
Creatinine, Ser: 6.42 mg/dL — ABNORMAL HIGH (ref 0.44–1.00)
GFR, Estimated: 7 mL/min — ABNORMAL LOW (ref >60)
Glucose, Bld: 82 mg/dL (ref 70–99)
Potassium: 4.1 mmol/L (ref 3.5–5.1)
Sodium: 126 mmol/L — ABNORMAL LOW (ref 135–145)

## 2023-04-25 LAB — MRSA NEXT GEN BY PCR, NASAL: MRSA by PCR Next Gen: NOT DETECTED

## 2023-04-25 MED ORDER — DEXTROSE 10 % IV SOLN: INTRAVENOUS | Status: DC

## 2023-04-25 MED ORDER — DEXTROSE 50 % IV SOLN
25.0000 g | INTRAVENOUS | Status: AC
  Administered 2023-04-25: 25 g via INTRAVENOUS

## 2023-04-25 MED ORDER — ACETAMINOPHEN 650 MG RE SUPP: 650.0000 mg | Freq: Four times a day (QID) | RECTAL | Status: DC | PRN

## 2023-04-25 MED ORDER — SODIUM CHLORIDE 0.9 % IV SOLN: INTRAVENOUS | Status: DC

## 2023-04-25 MED ORDER — GLYCOPYRROLATE 1 MG PO TABS: 1.0000 mg | ORAL_TABLET | ORAL | Status: DC | PRN

## 2023-04-25 MED ORDER — DEXTROSE 50 % IV SOLN
INTRAVENOUS | Status: AC
  Filled 2023-04-25: qty 50

## 2023-04-25 MED ORDER — ACETAMINOPHEN 325 MG PO TABS
650.0000 mg | ORAL_TABLET | Freq: Four times a day (QID) | ORAL | Status: DC | PRN
Start: 2023-04-25 — End: 2023-04-25
  Administered 2023-04-25 (×3): 650 mg
  Filled 2023-04-25 (×2): qty 2

## 2023-04-25 MED ORDER — GLYCOPYRROLATE 0.2 MG/ML IJ SOLN
0.2000 mg | INTRAMUSCULAR | Status: DC | PRN
  Administered 2023-04-25: 0.2 mg via INTRAVENOUS
  Filled 2023-04-25: qty 1

## 2023-04-25 MED ORDER — ACETAMINOPHEN 325 MG PO TABS: 650.0000 mg | ORAL_TABLET | Freq: Four times a day (QID) | ORAL | Status: DC | PRN

## 2023-04-25 MED ORDER — DEXTROSE 50 % IV SOLN
INTRAVENOUS | Status: AC
  Administered 2023-04-25: 50 mL
  Filled 2023-04-25: qty 50

## 2023-04-25 MED ORDER — GLYCOPYRROLATE 0.2 MG/ML IJ SOLN: 0.2000 mg | INTRAMUSCULAR | Status: DC | PRN

## 2023-04-25 MED ORDER — MORPHINE 100MG IN NS 100ML (1MG/ML) PREMIX INFUSION
0.0000 mg/h | INTRAVENOUS | Status: DC
  Administered 2023-04-25: 5 mg/h via INTRAVENOUS
  Filled 2023-04-25: qty 100

## 2023-04-25 MED ORDER — MORPHINE BOLUS VIA INFUSION
5.0000 mg | INTRAVENOUS | Status: DC | PRN
  Administered 2023-04-25: 5 mg via INTRAVENOUS

## 2023-04-25 NOTE — Progress Notes (Signed)
 RT placed pt on 6L 35% venti mask per pt family request.

## 2023-04-25 NOTE — Progress Notes (Signed)
 RT called to bedside per daughter. Daughter wanted to know why the ETT was being moved because her mother does not like it. This RT had explained to same daughter during first rounds that the ETT is moved to prevent any breakdown and is best for the pt. Daughter was upset because another RT came to help and moved the tube without knowing this RT had already. Justin RRT, then moved the tube back to original place moved. For second rounds, the ETT was moved again, which is protocol.  PER DAUGHTERS REQUEST; MOTHER DOES NOT LIKE ETT IN CENTER. PLEASE KEEP ETT TO LEFT OR RIGHT WHEN MOVING IT ON Q4 ROUNDS.  RT will continue to monitor.

## 2023-04-25 NOTE — Progress Notes (Signed)
 [facility] Progress Note   Subjective:   Coded while on dialysis, PEA arrest requiring CPR then transferred to ICU on pressors with norepi and vaso. Sedated on the vent.  Objective Vitals:   04/25/23 0600 04/25/23 0615 04/25/23 0630 04/25/23 0729  BP:      Pulse: 83 83 88   Resp: (!) 32 (!) 32 (!) 33   Temp:    (!) 102.5 F (39.2 C)  TempSrc:    Axillary  SpO2: 100% 100% 100%   Weight:      Height:       Physical Exam General: Lying in bed, no distress, ventilated Heart: Normal rate, no audible rub Lungs: Bilateral chest rise, clear to auscultation anteriorly, ventilated Abdomen: Soft, non-distended, +BS Extremities: trace edema b/l lower extremities  Additional Objective Labs: Basic Metabolic Panel: Recent Labs  Lab 04/21/23 0450 04/22/23 0546 04/24/23 0419 04/24/23 1224 04/24/23 1231 04/25/23 0252  NA 131*   < > 128* 132* 128* 126*  K 3.2*   < > 4.0 4.3 4.1 4.1  CL 104   < > 94* 97*  --  95*  CO2 24   < > 26 22  --  17*  GLUCOSE 80   < > 88 198*  --  82  BUN 15   < > 20 15  --  19  CREATININE 7.13*   < > 7.23* 6.09*  --  6.42*  CALCIUM 8.2*   < > 8.0* 7.8*  --  7.7*  PHOS 3.1  --   --  3.2  --   --    < > = values in this interval not displayed.   Liver Function Tests: Recent Labs  Lab 04/20/23 0937 04/21/23 0450 04/24/23 1224  AST 19 15 24   ALT 9 8 9   ALKPHOS 81 70 62  BILITOT 0.8 0.4 0.5  PROT 5.3* 4.2* <3.0*  ALBUMIN 2.3* 2.0* <1.5*   No results for input(s): "LIPASE", "AMYLASE" in the last 168 hours. CBC: Recent Labs  Lab 04/22/23 1055 04/23/23 0204 04/23/23 2126 04/24/23 0419 04/24/23 1224 04/24/23 1231 04/24/23 1928 04/25/23 0252  WBC 27.8*   < > 33.0* 24.9* 6.1  --  3.6* 5.7  NEUTROABS 1.6*  --  5.3  --   --   --  2.1  --   HGB 11.9*   < > 11.9* 11.7* 9.5* 11.2* 11.8* 10.9*  HCT 38.0   < > 38.1 37.8 30.9* 33.0* 36.4 33.4*  MCV 96.2   < > 96.2 95.2 96.0  --  91.0 90.8  PLT 53*   < > 46* 44* 30*  29*  --  36* 37*    < > = values in this interval not displayed.   Blood Culture    Component Value Date/Time   SDES BLOOD RIGHT ARM 04/20/2023 0937   SPECREQUEST  04/20/2023 7829    BOTTLES DRAWN AEROBIC AND ANAEROBIC Blood Culture results may not be optimal due to an inadequate volume of blood received in culture bottles   CULT  04/20/2023 0937    NO GROWTH 4 DAYS Performed at [facility], [address] [place], [place] [address]    REPTSTATUS PENDING 04/20/2023 0865    Cardiac Enzymes: No results for input(s): "CKTOTAL", "CKMB", "CKMBINDEX", "TROPONINI" in the last 168 hours. CBG: Recent Labs  Lab 04/24/23 2337 04/25/23 0012 04/25/23 0255 04/25/23 0532 04/25/23 0732  GLUCAP 73 118* 83 95 28*   Iron Studies: No results for  input(s): "IRON", "TIBC", "TRANSFERRIN", "FERRITIN" in the last 72 hours. @lablastinr3 @ Studies/Results: DG Abd 1 View Result Date: 04/24/2023 CLINICAL DATA:  Nasogastric tube placement. EXAM: ABDOMEN - 1 VIEW COMPARISON:  None Available. FINDINGS: Tip and side port of the enteric tube below the diaphragm in the stomach there is gaseous gastric distension. No small bowel dilatation. Multiple overlying artifacts. IMPRESSION: Tip and side port of the enteric tube below the diaphragm in the stomach. Electronically Signed   By: Narda Rutherford M.D.   On: 04/24/2023 14:48   DG CHEST PORT 1 VIEW Result Date: 04/24/2023 CLINICAL DATA:  Central line placement.  Nasogastric tube placement. EXAM: PORTABLE CHEST 1 VIEW COMPARISON:  Chest CT 04/20/2023 FINDINGS: The endotracheal tube tip is just above carina, recommend retraction of 2-3 cm. Enteric tube tip below the diaphragm not included in the chest field of view. Right subclavian central line tip overlies the upper SVC. Prior median sternotomy and TAVR. Stable cardiomegaly. No pneumothorax. Interstitial thickening may represent pulmonary edema. Small pleural effusions on CT are not well seen by radiograph. Overlying artifact  projects over the left lung base. IMPRESSION: 1. Endotracheal tube tip is just above carina, recommend retraction of 2-3 cm. 2. Right subclavian central line tip overlies the upper SVC. No pneumothorax. 3. Enteric tube tip below the diaphragm not included in the chest field of view. 4. Cardiomegaly with interstitial thickening suggesting pulmonary edema. Electronically Signed   By: Narda Rutherford M.D.   On: 04/24/2023 14:43   CT Angio Abd/Pel w/ and/or w/o Result Date: 04/23/2023 CLINICAL DATA:  Acute mesenteric ischemia, colitis EXAM: CTA ABDOMEN AND PELVIS WITHOUT AND WITH CONTRAST TECHNIQUE: Multidetector CT imaging of the abdomen and pelvis was performed using the standard protocol during bolus administration of intravenous contrast. Multiplanar reconstructed images and MIPs were obtained and reviewed to evaluate the vascular anatomy. RADIATION DOSE REDUCTION: This exam was performed according to the departmental dose-optimization program which includes automated exposure control, adjustment of the mA and/or kV according to patient size and/or use of iterative reconstruction technique. CONTRAST:  80mL OMNIPAQUE IOHEXOL 350 MG/ML SOLN COMPARISON:  04/20/2023 FINDINGS: VASCULAR Aorta: Moderate calcified atheromatous plaque. No aneurysm, dissection, or stenosis. Celiac: Patent without evidence of aneurysm, dissection, vasculitis or significant stenosis. SMA: Patent without evidence of aneurysm, dissection, vasculitis or significant stenosis. Renals: Single bilaterally, both with calcified ostial plaque resulting in only mild stenosis, patent distally. IMA: Patent without evidence of aneurysm, dissection, vasculitis or significant stenosis. Inflow: Mild calcified plaque in bilateral common and internal iliac arteries. No aneurysm, dissection, or stenosis. Proximal Outflow: Minimally atheromatous, patent. Veins: Patent hepatic veins, portal vein, SMV. Incomplete opacification of the iliac venous system, IVC, and  renal veins. Review of the MIP images confirms the above findings. NON-VASCULAR Lower chest: Trace pleural effusions right greater than left. Dependent atelectasis in the lung bases, worse right. Hepatobiliary: Gallbladder physiologically distended. Small subcentimeter hepatic cysts. No worrisome liver lesion or biliary ductal dilatation. Pancreas: Unremarkable. No pancreatic ductal dilatation or surrounding inflammatory changes. Spleen: Normal in size. Wedge-shaped perfusion defect superiorly, new since 03/10/2023. Adrenals/Urinary Tract: Significantly decreased renal parenchymal enhancement. Extensive renal arterial vascular calcifications. Multiple cortical lesions in both kidneys, some of which can be characterized as cysts, largest 3.1 cm 5 HU right lower pole; no followup recommended. Urinary bladder nondistended. Stomach/Bowel: Stomach is distended by gas and fluid without acute finding. Circumferential wall thickening with heterogenous enhancement in proximal and mid small bowel loops with which remain nondilated. No pneumatosis. The more distal small  bowel is unremarkable. Normal appendix. The colon is partially distended, with scattered diverticula in ascending and descending segments, without adjacent inflammatory change. Lymphatic: No abdominal or pelvic adenopathy. Reproductive: Uterus and bilateral adnexa are unremarkable. Other: Trace pelvic, perihepatic and perisplenic ascites. No free air. Musculoskeletal: Post PLIF L3-4. chronic erosive and destructive changes L2-3 with some loss of height. Coarse diffuse osseous sclerosis at all the levels, chronic. IMPRESSION: 1. No proximal visceral arterial occlusive disease to suggest an etiology of occlusive mesenteric ischemia. 2. Proximal and mid small bowel wall thickening and enhancement, suggesting enteritis. 3. Wedge-shaped perfusion defect in the superior spleen, new since 03/10/2023, suggesting infarct. 4. Significantly decreased renal parenchymal  enhancement, suggesting renal insufficiency. 5. Trace pleural effusions and ascites. 6.  Aortic Atherosclerosis (ICD10-I70.0). Electronically Signed   By: Corlis Leak M.D.   On: 04/23/2023 20:39   Medications:  sodium chloride     amiodarone 30 mg/hr (04/25/23 0731)   dexmedetomidine (PRECEDEX) IV infusion Stopped (04/25/23 0649)   dextrose 20 mL/hr at 04/25/23 0731   doxycycline (VIBRAMYCIN) IV Stopped (04/25/23 0400)   epinephrine Stopped (04/24/23 1229)   lactated ringers 999 mL/hr at 04/25/23 0731   norepinephrine (LEVOPHED) Adult infusion 44 mcg/min (04/25/23 0731)   piperacillin-tazobactam (ZOSYN)  IV Stopped (04/25/23 0549)   vasopressin 0.03 Units/min (04/25/23 0731)    arformoterol  15 mcg Nebulization BID   budesonide (PULMICORT) nebulizer solution  0.5 mg Nebulization BID   Chlorhexidine Gluconate Cloth  6 each Topical Q0600   docusate  100 mg Per Tube BID   hydrocortisone sod succinate (SOLU-CORTEF) inj  100 mg Intravenous Q8H   mouth rinse  15 mL Mouth Rinse Q2H   pantoprazole (PROTONIX) IV  40 mg Intravenous Q12H   polyethylene glycol  17 g Per Tube Daily   revefenacin  175 mcg Nebulization Daily   sodium chloride flush  3 mL Intravenous Q12H    Dialysis Orders: [facility] MWF - records have been requested  Assessment/Plan: 1. Hypotension/shock - BP fluctuating throughout hospitalization. Now w/ shock on pressors. Per primary 2. ESRD - HD MWF. 1 hour of HD on Friday before Code. Would try to hold on RRT for today. Would need temp cath placed for CRRT if she developed a need. Recommend advancing goals of care 4. Volume-likely moderately volume overloaded.  Ultrafiltration as tolerated with renal replacement therapy 5. 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 Leukocytosis -workup per infectious disease and hematology 8. Thrombocytopenia/Hx liver cirrhosis: Chronic issue. Per primary. No heparin with HD.  9.  COPD/Chronic  respiratory failure  - 2-3L [place] at baseline per patient  10.  Cardiac arrest: On dialysis but minimal ultrafiltration.  May be not related.  Overall prognosis fairly poor.  Recommend advancing goals of care conversation

## 2023-04-25 NOTE — IPAL (Signed)
  Interdisciplinary Goals of Care Family Meeting   Date carried out: 04/25/2023  Location of the meeting: Conference room  Member's involved: Physician, Bedside Registered Nurse, Social Worker, and Family Member or next of kin  Durable Power of Attorney or Environmental health practitioner:     Discussion: We discussed goals of care for [employer] .  Family is aware that she is on life support requiring multiple pressors.  They do not want her to suffer and have requested transition to comfort measures.  Orders placed.  Code status:   Code Status: Do not attempt resuscitation (DNR) PRE-ARREST INTERVENTIONS DESIRED   Disposition: In-patient comfort care  Time spent for the meeting: 10 mins    Chilton Greathouse, MD  04/25/2023, 5:37 PM

## 2023-04-25 NOTE — Progress Notes (Signed)
 Pt extubated to 6l [place] per comfort care order with family and RN at bedside.

## 2023-04-25 NOTE — Progress Notes (Signed)
 Pt made comfort care by daughter. Pt extubated and morphine gtt started. All vasopressors stopped. Pt appears comfortable and in no distress at this time. Family declined chaplain. Support and comfort provided to pt and family.

## 2023-04-25 NOTE — Progress Notes (Signed)
 NAME:  Jodi Bell, MRN:  [id number], DOB:  12/20/1960, LOS: 1 ADMISSION DATE:  04/20/2023, CONSULTATION DATE:  3/14 REFERRING MD:  Dr. Jonathon Bellows, CHIEF COMPLAINT:  Cardiac arrest.    History of Present Illness:   60F CAD s/p CABG, ESRD on HD, chronic hypoxemic respiratory failure on 2L, DM, s/p TAVR, AF on asa, cirrhosis, and recurrent GI bleed. Admitted 3/10 after experiencing weakness and hypotension during her outpatient HD treatment. Hypotension was felt to be medication related. Multiple antihypertensives at home, the thought is maybe she no longer needed so many/such high doses. Occult blood was positive, but hemoglobin was stable. Home antihypertensives were held.  Improved with some volume. Echo with some evidence of pulmonary hypertension. Course complicated by abdominal pain. CT showed slight ascending colon wall thickening. Leukocytosis. ID consulted for possible antibiotics. Eosinophil noted. Hematology called and workup sent. 3/14 during HD she suffered cardiac arrest. Brief duration. Intubated, transferred to ICU.   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   Significant Hospital Events: Including procedures, antibiotic start and stop dates in addition to other pertinent events   3/10 admit to [facility] for hypotension 3/14 code in HD, tx to ICU.   Interim History / Subjective:  Hypoglycemic overnight, and this morning, was treated with  D50.  Daughter at bedside, patient is awake but unable to follow commands.  Objective   Blood pressure (!) 143/28, pulse 81, temperature (!) 101.4 F (38.6 C), temperature source Axillary, resp. rate (!) 32, height 5\' 2"  (1.575 m), weight 65.2 kg, SpO2 100%.    Vent Mode: PRVC FiO2 (%):  [40 %-60 %] 40 % Set Rate:  [32 bmp] 32 bmp Vt Set:  [400 mL] 400 mL PEEP:  [8 cmH20] 8 cmH20 Plateau Pressure:  [22 cmH20-24 cmH20] 23 cmH20   Intake/Output Summary (Last 24 hours) at 04/25/2023 1201 Last data filed at 04/25/2023 1000 Gross per 24 hour  Intake 25745.47 ml  Output 1150 ml  Net 24595.47 ml   Filed Weights   04/24/23 0943 04/24/23 2325 04/25/23 0245  Weight: 63.2 kg 65.2 kg 65.2 kg   WBC 5.7 NA 126. SCr 6.42.  Examination: General: Elderly appearing female in NAD HENT: [place]/AT, PERRL, JVD Lungs: Clear vent assisted breaths Cardiovascular: IRIR tachy Abdomen: Soft, NT, ND Extremities: No acute deformity or significant edema.  Neuro: Awake, unable to follow commands.  Resolved Hospital Problem list   N/a  Assessment & Plan:   # Shock Cardiogenic versus hypovolemic. On the vent PRVC, 100% Fio2, labile blood pressures  OV, on epinephrine and vasopressin, MAP >65.  Patient is DNR/DNI, daughter at bedside, will speak to other members of the family this afternoon  for potential switch to comfort care,   -Norepinephrine -Vasopressin  # Acute respiratory failure with hypoxia: AHRF secondary to aspiration during intubation. -Vent as above. -IV Zosyn  Eosinophilia Thrombocytopenia Anemia of chronic disease,Hb stable at 10.9, platelets <37, bleeding from IV site has resolved.  Immature and decreased  platelets on blood smear.  Elevated eosinophils, etiology unclear, infectious disease and hematology workup pending. - ID and  hematology following, workup pending -Trend CBC - ? Roundworm, continue ivermectin per ID - BC NGTD - Smear pending - infectious workup per ID.   #  ESRD on HD MWF Per nephrology notes, will hold off on CRRT for today.  Recommending goals of care with family.  Hyponatremia secondary to volume overload.  - nephrology following - GOC.  HTN - holding home medications due to shock  DM - CBG monitoring and SSI  Best Practice (right click and "Reselect all SmartList Selections" daily)   Diet/type: NPO DVT prophylaxis SCD Pressure ulcer(s): N/A GI prophylaxis: PPI Lines: Central line and Arterial Line Foley:  N/A Code Status:  full code Last date of multidisciplinary goals of care discussion [ ]   Labs   CBC: Recent Labs  Lab 04/22/23 1055 04/23/23 0204 04/23/23 2126 04/24/23 0419 04/24/23 1224 04/24/23 1231 04/24/23 1928 04/25/23 0252  WBC 27.8*   < > 33.0* 24.9* 6.1  --  3.6* 5.7  NEUTROABS 1.6*  --  5.3  --   --   --  2.1  --   HGB 11.9*   < > 11.9* 11.7* 9.5* 11.2* 11.8* 10.9*  HCT 38.0   < > 38.1 37.8 30.9* 33.0* 36.4 33.4*  MCV 96.2   < > 96.2 95.2 96.0  --  91.0 90.8  PLT 53*   < > 46* 44* 30*  29*  --  36* 37*   < > = values in this interval not displayed.    Basic Metabolic Panel: Recent Labs  Lab 04/21/23 0450 04/22/23 0546 04/23/23 0204 04/24/23 0419 04/24/23 1224 04/24/23 1231 04/25/23 0252  NA 131* 131* 132* 128* 132* 128* 126*  K 3.2* 3.9 3.4* 4.0 4.3 4.1 4.1  CL 104 98 96* 94* 97*  --  95*  CO2 24 24 26 26 22   --  17*  GLUCOSE 80 64* 64* 88 198*  --  82  BUN 15 20 13 20 15   --  19  CREATININE 7.13* 8.16* 6.19* 7.23* 6.09*  --  6.42*  CALCIUM 8.2* 9.1 8.3* 8.0* 7.8*  --  7.7*  MG  --   --   --   --  1.4*  --   --   PHOS 3.1  --   --   --  3.2  --   --    GFR: Estimated Creatinine Clearance: 8 mL/min (A) (by C-G formula based on SCr of 6.42 mg/dL (H)). Recent Labs  Lab 04/20/23 1010 04/21/23 0450 04/22/23 0546 04/24/23 0419 04/24/23 1224 04/24/23 1928 04/25/23 0252  PROCALCITON  --  2.65  --   --   --   --   --   WBC  --  27.6*   < > 24.9* 6.1 3.6* 5.7  LATICACIDVEN 1.6  --   --    --   --   --   --    < > = values in this interval not displayed.    Liver Function Tests: Recent Labs  Lab 04/20/23 0937 04/21/23 0450 04/24/23 1224  AST 19 15 24   ALT 9 8 9   ALKPHOS 81 70 62  BILITOT 0.8 0.4 0.5  PROT 5.3* 4.2* <3.0*  ALBUMIN 2.3* 2.0* <1.5*   No results for input(s): "LIPASE", "AMYLASE" in the last 168 hours. No results for input(s): "AMMONIA" in the last 168 hours.  ABG    Component Value Date/Time   PHART 7.225 (L) 04/24/2023 1231   PCO2ART 57.6 (H) 04/24/2023 1231  PO2ART 288 (H) 04/24/2023 1231   HCO3 24.0 04/24/2023 1231   TCO2 26 04/24/2023 1231   ACIDBASEDEF 4.0 (H) 04/24/2023 1231   O2SAT 100 04/24/2023 1231     Coagulation Profile: Recent Labs  Lab 04/20/23 0937 04/24/23 1224  INR 1.7* 3.0*    Cardiac Enzymes: No results for input(s): "CKTOTAL", "CKMB", "CKMBINDEX", "TROPONINI" in the last 168 hours.  HbA1C: Hgb A1c MFr Bld  Date/Time Value Ref Range Status  09/20/2022 12:17 AM 4.5 (L) 4.8 - 5.6 % Final    Comment:    (NOTE) Pre diabetes:          5.7%-6.4%  Diabetes:              >6.4%  Glycemic control for   <7.0% adults with diabetes   03/18/2022 07:47 PM 4.8 4.8 - 5.6 % Final    Comment:    (NOTE) Pre diabetes:          5.7%-6.4%  Diabetes:              >6.4%  Glycemic control for   <7.0% adults with diabetes     CBG: Recent Labs  Lab 04/25/23 0532 04/25/23 0732 04/25/23 0829 04/25/23 1121 04/25/23 1149  GLUCAP 95 28* 167* 38* 210*    Review of Systems:   Patient is encephalopathic and/or intubated; therefore, history has been obtained from chart review.    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   Surgical History:   Past Surgical History:  Procedure Laterality Date   BACK SURGERY  2016   lower back fusion with cage   BIOPSY  09/23/2017   Procedure: BIOPSY;  Surgeon: Graylin Shiver, MD;  Location: [facility] ENDOSCOPY;  Service: Endoscopy;;   BIOPSY  05/19/2019   Procedure: BIOPSY;  Surgeon: Kathi Der, MD;  Location: [facility] ENDOSCOPY;  Service: Gastroenterology;;   CESAREAN SECTION  1990   x 1    COLONOSCOPY WITH PROPOFOL N/A 09/23/2017   Procedure: COLONOSCOPY WITH PROPOFOL Hemostatic clips placed;  Surgeon: Graylin Shiver, MD;  Location: [facility] ENDOSCOPY;  Service: Endoscopy;  Laterality: N/A;   COLONOSCOPY WITH PROPOFOL N/A 10/02/2020   Procedure: COLONOSCOPY WITH PROPOFOL;  Surgeon: Willis Modena, MD;  Location: [facility] ENDOSCOPY;  Service: Endoscopy;  Laterality: N/A;   CORONARY ARTERY BYPASS GRAFT N/A 09/13/2020   Procedure: CORONARY ARTERY BYPASS GRAFTING (CABG), ON PUMP, TIMES TWO, USING LEFT INTERNAL MAMMARY ARTERY AND RIGHT ENDOSCOPICALLY HARVESTED GREATER SAPHENOUS VEIN;  Surgeon: Alleen Borne, MD;  Location: [facility] OR;  Service: Open Heart Surgery;  Laterality: N/A;   DILATION AND CURETTAGE OF UTERUS  1988   ENTEROSCOPY N/A 07/22/2021   Procedure: ENTEROSCOPY;  Surgeon: Kerin Salen, MD;  Location: [facility] ENDOSCOPY;  Service: Gastroenterology;  Laterality: N/A;   ENTEROSCOPY N/A 01/28/2023   Procedure: ENTEROSCOPY;  Surgeon: [name], MD;  Location: [facility] ENDOSCOPY;  Service: Gastroenterology;  Laterality: N/A;   ESOPHAGOGASTRODUODENOSCOPY N/A 09/22/2020   Procedure: ESOPHAGOGASTRODUODENOSCOPY (EGD);  Surgeon: Willis Modena, MD;  Location: [facility] ENDOSCOPY;  Service: Endoscopy;  Laterality: N/A;   ESOPHAGOGASTRODUODENOSCOPY (EGD) WITH  PROPOFOL  N/A 09/23/2017   Procedure: ESOPHAGOGASTRODUODENOSCOPY (EGD) WITH PROPOFOL;  Surgeon: Graylin Shiver, MD;  Location: [facility] ENDOSCOPY;  Service: Endoscopy;  Laterality: N/A;   ESOPHAGOGASTRODUODENOSCOPY (EGD) WITH PROPOFOL N/A 05/19/2019   Procedure: ESOPHAGOGASTRODUODENOSCOPY (EGD) WITH PROPOFOL;  Surgeon: Kathi Der, MD;  Location: [facility] ENDOS[facility];  Service: Gastroenterology;  Laterality: N/A;   ESOPHAGOGASTRODUODENOSCOPY (EGD) WITH PROPOFOL N/A 10/02/2020   Procedure: ESOPHAGOGASTRODUODENOSCOPY (EGD) WITH PROPOFOL;  Surgeon: Willis Modena, MD;  Location: [facility] ENDOSCOPY;  Service: Endoscopy;  Laterality: N/A;   GIVENS CAPSULE STUDY N/A 05/19/2019   Procedure: GIVENS CAPSULE STUDY;  Surgeon: Kathi Der, MD;  Location: [facility] ENDOSCOPY;  Service: Gastroenterology;  Laterality: N/A;   GIVENS CAPSULE STUDY N/A 07/19/2021   Procedure: GIVENS CAPSULE STUDY;  Surgeon: [name], MD;  Location: [facility] ENDOSCOPY;  Service: Gastroenterology;  Laterality: N/A;   HEMOSTASIS CLIP PLACEMENT  09/22/2020   Procedure: HEMOSTASIS CLIP PLACEMENT;  Surgeon: Willis Modena, MD;  Location: [facility] ENDOSCOPY;  Service: Endoscopy;;   HEMOSTASIS CONTROL  09/22/2020   Procedure: HEMOSTASIS CONTROL;  Surgeon: Willis Modena, MD;  Location: [facility] ENDOSCOPY;  Service: Endoscopy;;   HOT HEMOSTASIS N/A 05/19/2019   Procedure: HOT HEMOSTASIS (ARGON PLASMA COAGULATION/BICAP);  Surgeon: Kathi Der, MD;  Location: [facility] ENDOSCOPY;  Service: Gastroenterology;  Laterality: N/A;   HOT HEMOSTASIS N/A 01/28/2023   Procedure: HOT HEMOSTASIS (ARGON PLASMA COAGULATION/BICAP);  Surgeon: [name], MD;  Location: [facility] ENDOSCOPY;  Service: Gastroenterology;  Laterality: N/A;   INTRAOPERATIVE TRANSTHORACIC ECHOCARDIOGRAM N/A 12/23/2022   Procedure: INTRAOPERATIVE TRANSTHORACIC ECHOCARDIOGRAM;  Surgeon: [name], MD;  [facility]: [facility] INVASIVE CV LAB;  Service: Cardiovascular;  Laterality: N/A;   LEFT HEART CATH AND CORONARY ANGIOGRAPHY N/A  09/10/2020   Procedure: LEFT HEART CATH AND CORONARY ANGIOGRAPHY;  Surgeon: Yvonne Kendall, MD;  [facility]: [facility] INVASIVE CV LAB;  Service: Cardiovascular;  Laterality: N/A;   PARTIAL KNEE ARTHROPLASTY Left 11/12/2017   Procedure: left unicompartmental arthroplasty-medial;  Surgeon: Durene Romans, MD;  Location: [facility] ORS;  Service: Orthopedics;  Laterality: Left;    RIGHT HEART CATH AND CORONARY/GRAFT ANGIOGRAPHY N/A 06/26/2022   Procedure: RIGHT HEART CATH AND CORONARY/GRAFT ANGIOGRAPHY;  Surgeon: [name], MD;  [facility]: [facility] INVASIVE CV LAB;  Service: Cardiovascular;  Laterality: N/A;   SUBMUCOSAL INJECTION  09/23/2017   Procedure: SUBMUCOSAL INJECTION;  Surgeon: Graylin Shiver, MD;  Location: [facility] ENDOSCOPY;  Service: Endoscopy;;  in colon   TEE WITHOUT CARDIOVERSION N/A 09/13/2020   Procedure: TRANSESOPHAGEAL ECHOCARDIOGRAM (TEE);  Surgeon: Alleen Borne, MD;  Location: [facility] OR;  Service: Open Heart Surgery;  Laterality: N/A;   TUBAL LIGATION  1990     Social History:   reports that she quit smoking about 2 years ago. Her smoking use included cigarettes. She started smoking about 42 years ago. She has a 20 pack-year smoking history. She has never used smokeless tobacco. She reports current alcohol use. She reports that she does not use drugs.   Family History:  Her family history includes Arthritis in her daughter and daughter; Asthma in her daughter; Diabetes in her sister; Heart disease in her sister and another family member; Lung cancer in her mother; Thyroid cancer in an other family member.   Allergies Allergies  Allergen Reactions   3-Methyl-2-Benzothiazolinone Hydrazone Other (See Comments)    Muscle weakness muscle cramping in legs Other reaction(s): Myalgias (Muscle Pain)   Banana Anaphylaxis, Swelling and Other (See Comments)    TONGUE AND MOUTH SWELLING   Black Walnut Flavoring Agent (Non-Screening) Anaphylaxis and Itching  Walnuts   Hazelnut (Filbert) Anaphylaxis     Hazelnuts   Leflunomide And Related Other (See Comments)    Severe Headache   Lisinopril Swelling    Angioedema  Swelling of the face   No [employer]alt[employer] Food Allergies Anaphylaxis    Spicy Mustard 4.7.2021 Pt reports that she eats Regular Yellow Mustard Swelling and itching of tongue   Other Anaphylaxis, Swelling and Other (See Comments)    Cosentyx= angioedema Mouth itches and tongue swelling Hazel nuts and pecans also Walnuts Renal failure Serotonin syndrome  Tremors Muscle weakness muscle cramping in legs Other reaction(s): Myalgias (Muscle Pain)   Pecan Nut (Diagnostic) Anaphylaxis and Itching    Pecans   Trazodone And Nefazodone Other (See Comments)    Serotonin Syndrome  Tremors   Adalimumab Other (See Comments)    Blisters on abdomen =humira   Escitalopram Oxalate Other (See Comments)    Hand problems - Serotonin Syndrome     Ezetimibe Other (See Comments)    myalgias cramps    Secukinumab Swelling    Cosentyx = angiodema    Statins Other (See Comments)    Leg pains and weakness   Ferrlecit [Na Ferric Gluc Cplx In Sucrose]     Not reaction given from HD   Gabapentin Other (See Comments)    tremors   Nsaids     Avoid due to renal problems      Home Medications  Prior to Admission medications   Medication Sig Start Date End Date Taking? Authorizing Provider  acetaminophen (TYLENOL) 500 MG tablet Take 1,000 mg by mouth daily as needed (pain).   Yes [provider]  allopurinol (ZYLOPRIM) 100 MG tablet Take 100 mg by mouth every evening.   Yes [provider]  Artificial Tear Solution (TEARS NATURALE OP) Place 1 drop into both eyes 4 (four) times daily as needed (dry eye).   Yes [provider]  aspirin 81 MG chewable tablet Chew 1 tablet (81 mg total) by mouth every other day. 01/07/23  Yes Monna Fam, MD  busPIRone (BUSPAR) 5 MG tablet Take 5 mg by mouth 3 (three) times daily. 01/01/23 01/01/24 Yes [provider]   carvedilol (COREG) 25 MG tablet Take 1 tablet (25 mg total) by mouth 2 (two) times daily with a meal. 12/25/22  Yes Janetta Hora, PA-C  Cholecalciferol (VITAMIN D) 50 MCG (2000 UT) tablet Take 2,000 Units by mouth daily.   Yes [provider]  diclofenac Sodium (VOLTAREN) 1 % GEL Apply 2 g topically daily as needed (pain).   Yes [provider]  diphenhydrAMINE (BENADRYL) 25 mg capsule Take 25 mg by mouth every 6 (six) hours as needed for sleep.   Yes [provider]  docusate sodium (COLACE) 100 MG capsule Take 100 mg by mouth daily as needed for moderate constipation.   Yes [provider]  EPINEPHrine 0.3 mg/0.3 mL IJ SOAJ injection Inject into the muscle as needed for anaphylaxis. 07/12/21  Yes [provider]  fluticasone (FLONASE) 50 MCG/ACT nasal spray Place 1 spray into both nostrils daily as needed for allergies or rhinitis.   Yes [provider]  isosorbide mononitrate (IMDUR) 60 MG 24 hr tablet Take 1 tablet (60 mg total) by mouth daily. 10/27/22  Yes Jake Bathe, MD  LORazepam (ATIVAN) 0.5 MG tablet Take 0.5 mg by mouth as needed for anxiety. Take as breakthrough medication to buspirone.   Yes [provider]  montelukast (SINGULAIR) 10 MG tablet Take 10  mg by mouth daily with supper.    Yes [provider]  multivitamin (RENA-VIT) TABS tablet Take 1 tablet by mouth daily with supper.   Yes [provider]  NIFEdipine (PROCARDIA XL/NIFEDICAL XL) 60 MG 24 hr tablet Take 60 mg by mouth 2 (two) times daily. 10/01/21  Yes [provider]  olmesartan (BENICAR) 40 MG tablet Take 40 mg by mouth every evening. 05/06/19  Yes [provider]  ondansetron (ZOFRAN) 4 MG tablet Take 4 mg by mouth 2 (two) times daily as needed for nausea or vomiting.   Yes [provider]  OXYGEN Inhale 2-3 L into the lungs continuous.   Yes [provider]  pantoprazole (PROTONIX) 40 MG tablet  Take 40 mg by mouth 2 (two) times daily. 06/09/20  Yes [provider]  predniSONE (DELTASONE) 5 MG tablet Take 1 tablet (5 mg total) by mouth daily with breakfast. 02/27/22  Yes Marrianne Mood, MD  Probiotic Product (PROBIOTIC PO) Take 1 capsule by mouth daily.   Yes [provider]  Tiotropium Bromide-Olodaterol (STIOLTO RESPIMAT) 2.5-2.5 MCG/ACT AERS Inhale 2 puffs once daily into the lungs. Patient taking differently: Inhale 2 puffs into the lungs daily as needed (SOB Wheezing). 11/28/21  Yes Hunsucker, Lesia Sago, MD  VENTOLIN HFA 108 (90 Base) MCG/ACT inhaler Inhale 2 puffs into the lungs See admin instructions. Take 2 puffs every 4-6 hours as needed for wheezing and shortness of breath 04/08/22  Yes Hunsucker, Lesia Sago, MD  zolpidem (AMBIEN) 5 MG tablet Take 5 mg by mouth at bedtime as needed for sleep. 02/04/21  Yes [provider]  albuterol (PROVENTIL) (2.5 MG/3ML) 0.083% nebulizer solution Take 3 mLs (2.5 mg total) by nebulization every 6 (six) hours as needed for wheezing or shortness of breath. 07/10/21   Hunsucker, Lesia Sago, MD  cetirizine (ZYRTEC) 10 MG tablet Take 10 mg by mouth daily as needed for allergies. Patient not taking: Reported on 04/20/2023    [provider]  hydrocortisone 2.5%-nystatin-zinc oxide 20% 1:1:1 ointment mixture Apply topically daily as needed. 01/07/23   Monna Fam, MD  silver sulfADIAZINE (SILVADENE) 1 % cream Apply 1 Application topically daily as needed (to prevent wound). Patient not taking: Reported on 04/20/2023    [provider]         Laretta Bolster, M.D.  Internal Medicine Resident, PGY-1 [facility]  Pager: [phone] 12:01 PM, 04/25/2023   See Amion for personal pager PCCM on call pager [phone] until 7pm. Please call Elink 7p-7a. [phone]  04/25/2023 12:01 PM

## 2023-04-25 NOTE — Progress Notes (Incomplete)
 Attending note: I have seen and examined the patient. History, labs and imaging reviewed.  63 Y/O with CAD, ESRD, presenting with hypotension, enteritis, suffered, cardiac arrest Continues on vent,   Blood pressure (!) 143/28, pulse 88, temperature (!) 102.8 F (39.3 C), temperature source Axillary, resp. rate (!) 33, height 5\' 2"  (1.575 m), weight 65.2 kg, SpO2 100%. Gen:      No acute distress HEENT:  EOMI, sclera anicteric Neck:     No masses; no thyromegaly Lungs:    Clear to auscultation bilaterally; normal respiratory effort*** CV:         Regular rate and rhythm; no murmurs Abd:      + bowel sounds; soft, non-tender; no palpable masses, no distension Ext:    No edema; adequate peripheral perfusion Skin:      Warm and dry; no rash Neuro: alert and oriented x 3 Psych: normal mood and affect   Labs/Imaging personally reviewed, significant for Plts 37  Assessment/plan: Cardiac arrest, VTs Shock due to sepsis vs cardiogenic On Norepi and vasopressin On amiodarone Zosyn for antibiotic coverage  Acite resp failure Aspiration Abx as above Wean down O2, Fio2 as toelrated  ESRD Nephrology is following May need to CRRT with new HD line  Abnormal labs Leukocytosis, eosiophilia Being treated with ivermectin empirical for parasitic infection Needs TEE at some time when she is more stable   Goals of care Now DNR. WIll need golas of care convesation   The patient is critically ill with multiple organ systems failure and requires high complexity decision making for assessment and support, frequent evaluation and titration of therapies, application of advanced monitoring technologies and extensive interpretation of multiple databases.  Critical care time - 35 mins. This represents my time independent of the NPs time taking care of the pt.  Chilton Greathouse MD [facility] 04/25/2023, 10:06 AM

## 2023-04-26 LAB — IGG, IGA, IGM
IgA: 361 mg/dL — ABNORMAL HIGH (ref 87–352)
IgG (Immunoglobin G), Serum: 1056 mg/dL (ref 586–1602)
IgM (Immunoglobulin M), Srm: 59 mg/dL (ref 26–217)

## 2023-04-26 LAB — STRONGYLOIDES, AB, IGG: Strongyloides, Ab, IgG: NEGATIVE

## 2023-04-27 LAB — SPOTTED FEVER GROUP ANTIBODIES
Spotted Fever Group IgG: <1:64 {titer}
Spotted Fever Group IgM: <1:64 {titer}

## 2023-04-27 LAB — EHRLICHIA ANTIBODY PANEL
E chaffeensis (HGE) Ab, IgG: NEGATIVE
E chaffeensis (HGE) Ab, IgM: NEGATIVE
E. Chaffeensis (HME) IgM Titer: NEGATIVE
E.Chaffeensis (HME) IgG: NEGATIVE

## 2023-04-27 LAB — LYME DISEASE SEROLOGY W/REFLEX: Lyme Total Antibody EIA: NEGATIVE

## 2023-04-27 LAB — GLUCOSE, CAPILLARY: Glucose-Capillary: 84 mg/dL (ref 70–99)

## 2023-04-28 LAB — TRYPTASE: Tryptase: 20 ug/L — ABNORMAL HIGH (ref 2.2–13.2)

## 2023-04-29 LAB — MPN W/HYPEREOSINOPHILIA FISH
MPN Cells Analyzed: 200
MPN Cells Counted: 200

## 2023-04-29 LAB — MISC LABCORP TEST (SEND OUT): Labcorp test code: 512287

## 2023-05-01 LAB — IGE: IgE (Immunoglobulin E), Serum: 325 [IU]/mL (ref 6–495)

## 2023-05-06 LAB — NGS JAK2 E12-15/CALR/MPL

## 2023-05-10 ENCOUNTER — Other Ambulatory Visit: Payer: Self-pay | Admitting: Cardiology

## 2023-05-12 NOTE — Death Summary Note (Signed)
 DEATH SUMMARY   Patient Details  Name: Jodi Bell MRN: [id number] DOB: Nov 18, 1960  Admission/Discharge Information   Admit Date:  05/08/23  Date of Death: Date of Death: May 13, 2023  Time of Death: Time of Death: 05-20-2216  Length of Stay: 2  Referring Physician: Genelle Gather, MD   Reason(s) for Hospitalization  Hypotension and generalized weakness  Diagnoses  Preliminary cause of death:  Secondary Diagnoses (including complications and co-morbidities):  Principal Problem:   Hypotension Active Problems:   Non-insulin dependent type 2 diabetes mellitus (HCC)   Essential hypertension   COPD (chronic obstructive pulmonary disease) (HCC)   Tobacco use   GI bleeding   Coronary artery disease involving coronary bypass graft of native heart with angina pectoris (HCC)   Atrial fibrillation (HCC)   Acute on chronic respiratory failure with hypoxia (HCC)   Thrombocytopenia (HCC)   ESRD on dialysis (HCC)   Cirrhosis of liver (HCC)   Eosinophilia   Cardiac arrest, cause unspecified (HCC)   Cardiogenic shock [facility])   Brief Hospital Course (including significant findings, care, treatment, and services provided and events leading to death)  Jodi Bell is a 63 y.o. year old female with a history of  CAD s/p CABG, ESRD on HD, chronic hypoxemic respiratory failure on 2L, who presented with weakness and hypotension during her outpatient HD treatment.  She had a cardiac arrest while receiving inpatient HD, Patient received high quality chest compressions for 6 minutes with defibrillation or cardioversion when appropriate. Epinephrine was administered every 3 minutes as directed by time Biomedical engineer.  She was then transferred to the ICU, where she had A-fib with RVR, and underwent direct-current cardioversion.  Cardiac arrest, VTs Shock due to sepsis vs cardiogenic On Norepi and vasopressin On amiodarone Zosyn for antibiotic coverage    Acite resp failure Aspiration Treated with  antibiotics, and vent support.  ESRD -Unable to tolerate HD sessions due to persistent episodes of hypotension despite minimal ultrafiltration.  CRRT was not started in the ICU.   We discussed goals of care for Jodi Bell, and family requested transition to comfort measures given the poor prognosis of illness.  Pertinent Labs and Studies  Significant Diagnostic Studies DG Abd 1 View Result Date: 04/24/2023 CLINICAL DATA:  Nasogastric tube placement. EXAM: ABDOMEN - 1 VIEW COMPARISON:  None Available. FINDINGS: Tip and side port of the enteric tube below the diaphragm in the stomach there is gaseous gastric distension. No small bowel dilatation. Multiple overlying artifacts. IMPRESSION: Tip and side port of the enteric tube below the diaphragm in the stomach. Electronically Signed   By: Narda Rutherford M.D.   On: 04/24/2023 14:48   DG CHEST PORT 1 VIEW Result Date: 04/24/2023 CLINICAL DATA:  Central line placement.  Nasogastric tube placement. EXAM: PORTABLE CHEST 1 VIEW COMPARISON:  Chest CT 05/08/23 FINDINGS: The endotracheal tube tip is just above carina, recommend retraction of 2-3 cm. Enteric tube tip below the diaphragm not included in the chest field of view. Right subclavian central line tip overlies the upper SVC. Prior median sternotomy and TAVR. Stable cardiomegaly. No pneumothorax. Interstitial thickening may represent pulmonary edema. Small pleural effusions on CT are not well seen by radiograph. Overlying artifact projects over the left lung base. IMPRESSION: 1. Endotracheal tube tip is just above carina, recommend retraction of 2-3 cm. 2. Right subclavian central line tip overlies the upper SVC. No pneumothorax. 3. Enteric tube tip below the diaphragm not included in the chest field of view. 4. Cardiomegaly with  interstitial thickening suggesting pulmonary edema. Electronically Signed   By: Narda Rutherford M.D.   On: 04/24/2023 14:43   CT Angio Abd/Pel w/ and/or w/o Result  Date: 04/23/2023 CLINICAL DATA:  Acute mesenteric ischemia, colitis EXAM: CTA ABDOMEN AND PELVIS WITHOUT AND WITH CONTRAST TECHNIQUE: Multidetector CT imaging of the abdomen and pelvis was performed using the standard protocol during bolus administration of intravenous contrast. Multiplanar reconstructed images and MIPs were obtained and reviewed to evaluate the vascular anatomy. RADIATION DOSE REDUCTION: This exam was performed according to the departmental dose-optimization program which includes automated exposure control, adjustment of the mA and/or kV according to patient size and/or use of iterative reconstruction technique. CONTRAST:  80mL OMNIPAQUE IOHEXOL 350 MG/ML SOLN COMPARISON:  04/20/2023 FINDINGS: VASCULAR Aorta: Moderate calcified atheromatous plaque. No aneurysm, dissection, or stenosis. Celiac: Patent without evidence of aneurysm, dissection, vasculitis or significant stenosis. SMA: Patent without evidence of aneurysm, dissection, vasculitis or significant stenosis. Renals: Single bilaterally, both with calcified ostial plaque resulting in only mild stenosis, patent distally. IMA: Patent without evidence of aneurysm, dissection, vasculitis or significant stenosis. Inflow: Mild calcified plaque in bilateral common and internal iliac arteries. No aneurysm, dissection, or stenosis. Proximal Outflow: Minimally atheromatous, patent. Veins: Patent hepatic veins, portal vein, SMV. Incomplete opacification of the iliac venous system, IVC, and renal veins. Review of the MIP images confirms the above findings. NON-VASCULAR Lower chest: Trace pleural effusions right greater than left. Dependent atelectasis in the lung bases, worse right. Hepatobiliary: Gallbladder physiologically distended. Small subcentimeter hepatic cysts. No worrisome liver lesion or biliary ductal dilatation. Pancreas: Unremarkable. No pancreatic ductal dilatation or surrounding inflammatory changes. Spleen: Normal in size. Wedge-shaped  perfusion defect superiorly, new since 03/10/2023. Adrenals/Urinary Tract: Significantly decreased renal parenchymal enhancement. Extensive renal arterial vascular calcifications. Multiple cortical lesions in both kidneys, some of which can be characterized as cysts, largest 3.1 cm 5 HU right lower pole; no followup recommended. Urinary bladder nondistended. Stomach/Bowel: Stomach is distended by gas and fluid without acute finding. Circumferential wall thickening with heterogenous enhancement in proximal and mid small bowel loops with which remain nondilated. No pneumatosis. The more distal small bowel is unremarkable. Normal appendix. The colon is partially distended, with scattered diverticula in ascending and descending segments, without adjacent inflammatory change. Lymphatic: No abdominal or pelvic adenopathy. Reproductive: Uterus and bilateral adnexa are unremarkable. Other: Trace pelvic, perihepatic and perisplenic ascites. No free air. Musculoskeletal: Post PLIF L3-4. chronic erosive and destructive changes L2-3 with some loss of height. Coarse diffuse osseous sclerosis at all the levels, chronic. IMPRESSION: 1. No proximal visceral arterial occlusive disease to suggest an etiology of occlusive mesenteric ischemia. 2. Proximal and mid small bowel wall thickening and enhancement, suggesting enteritis. 3. Wedge-shaped perfusion defect in the superior spleen, new since 03/10/2023, suggesting infarct. 4. Significantly decreased renal parenchymal enhancement, suggesting renal insufficiency. 5. Trace pleural effusions and ascites. 6.  Aortic Atherosclerosis (ICD10-I70.0). Electronically Signed   By: Corlis Leak M.D.   On: 04/23/2023 20:39   ECHOCARDIOGRAM COMPLETE BUBBLE STUDY Result Date: 04/21/2023    ECHOCARDIOGRAM REPORT   Patient Name:   THATIANA RENBARGER Date of Exam: 04/21/2023 Medical Rec #:  [id number]        Height:       62.0 in Accession #:    [id number]       Weight:       122.1 lb Date of Birth:   06-May-1960         BSA:  1.550 m Patient Age:    62 years         BP:           117/58 mmHg Patient Gender: F                HR:           71 bpm. Exam Location:  Inpatient Procedure: 2D Echo, 3D Echo, Cardiac Doppler, Color Doppler, Strain Analysis and            Saline Contrast Bubble Study (Both Spectral and Color Flow Doppler            were utilized during procedure). Indications:    Acute Hypoxemic Respiratory Failure  History:        Patient has prior history of Echocardiogram examinations, most                 recent 02/02/2023. Prior CABG, COPD; Risk Factors:Hypertension,                 Diabetes and Former Smoker.  Sonographer:    Karma Ganja Referring Phys: [phone] EKTA V PATEL IMPRESSIONS  1. Mildly D-shaped interventricular septum suggestive of a degree of RV pressure/volume overload. Left ventricular ejection fraction, by estimation, is 55 to 60%. The left ventricle has normal function. The left ventricle has no regional wall motion abnormalities. There is moderate concentric left ventricular hypertrophy. Left ventricular diastolic parameters are consistent with Grade II diastolic dysfunction (pseudonormalization). The average left ventricular global longitudinal strain is -13.7 %. The global longitudinal strain is abnormal.  2. Right ventricular systolic function is mildly reduced. The right ventricular size is mildly enlarged. There is severely elevated pulmonary artery systolic pressure. The estimated right ventricular systolic pressure is 64.6 mmHg.  3. Left atrial size was severely dilated.  4. Right atrial size was moderately dilated.  5. Bubble study is negative (a few bubbles cross very late).  6. The mitral valve is degenerative. Trivial mitral valve regurgitation. Mild mitral stenosis. The mean mitral valve gradient is 5.0 mmHg. Moderate mitral annular calcification.  7. The tricuspid valve is abnormal. Tricuspid valve regurgitation is moderate.  8. Bioprosthetic aortic valve s/p TAVR  with 23 mm Edwards Sapien THV. Mild perivalvular leakage. Mean gradient 36 mmmHg with dimensionless index 0.38 and EOA 1.48 cm^2. LVOT VTI 33 with SV 126 mL. There does appear to be significant bioprosthetic aortic  valve stenosis, similar gradient to prior echo. However, high gradient is likely exacerbated by high flow.  9. The inferior vena cava is dilated in size with <50% respiratory variability, suggesting right atrial pressure of 15 mmHg. FINDINGS  Left Ventricle: Mildly D-shaped interventricular septum suggestive of a degree of RV pressure/volume overload. Left ventricular ejection fraction, by estimation, is 55 to 60%. The left ventricle has normal function. The left ventricle has no regional wall motion abnormalities. The average left ventricular global longitudinal strain is -13.7 %. Strain was performed and the global longitudinal strain is abnormal. The left ventricular internal cavity size was normal in size. There is moderate concentric  left ventricular hypertrophy. Left ventricular diastolic parameters are consistent with Grade II diastolic dysfunction (pseudonormalization). Right Ventricle: The right ventricular size is mildly enlarged. No increase in right ventricular wall thickness. Right ventricular systolic function is mildly reduced. There is severely elevated pulmonary artery systolic pressure. The tricuspid regurgitant velocity is 3.52 m/s, and with an assumed right atrial pressure of 15 mmHg, the estimated right ventricular systolic pressure is 64.6 mmHg. Left Atrium:  Left atrial size was severely dilated. Right Atrium: Right atrial size was moderately dilated. Pericardium: There is no evidence of pericardial effusion. Mitral Valve: The mitral valve is degenerative in appearance. There is mild calcification of the mitral valve leaflet(s). Moderate mitral annular calcification. Trivial mitral valve regurgitation. Mild mitral valve stenosis. MV peak gradient, 13.1 mmHg. The mean mitral valve  gradient is 5.0 mmHg. Tricuspid Valve: The tricuspid valve is abnormal. Tricuspid valve regurgitation is moderate. 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 Mild perivalvular leakage. Mean gradient 36 mmmHg with dimensionless index 0.38 and EOA 1.48 cm^2. LVOT VTI 33 with SV 126 mL. There does appear to be significant bioprosthetic aortic valve stenosis, similar gradient to prior echo. However, high gradient is likely exacerbated by high flow. The aortic valve has been repaired/replaced. Aortic valve regurgitation is mild. Aortic valve mean gradient measures 36.0 mmHg. Aortic valve peak gradient measures 62.4 mmHg. Aortic valve area, by VTI measures 1.48 cm. Pulmonic Valve: The pulmonic valve was normal in structure. Pulmonic valve regurgitation is trivial. Aorta: The aortic root is normal in size and structure. Venous: The inferior vena cava is dilated in size with less than 50% respiratory variability, suggesting right atrial pressure of 15 mmHg. IAS/Shunts: Bubble study is negative (a few bubbles cross very late). Agitated saline contrast was given intravenously to evaluate for intracardiac shunting.  LEFT VENTRICLE PLAX 2D LVIDd:         3.70 cm   Diastology LVIDs:         2.40 cm   LV e' medial:    5.22 cm/s LV PW:         2.40 cm   LV E/e' medial:  31.0 LV IVS:        1.50 cm   LV e' lateral:   6.53 cm/s LVOT diam:     2.20 cm   LV E/e' lateral: 24.8 LV SV:         126 LV SV Index:   81        2D Longitudinal Strain LVOT Area:     3.80 cm  2D Strain GLS Avg:     -13.7 %                           3D Volume EF:                          3D EF:        57 %                          LV EDV:       159 ml                          LV ESV:       69 ml                          LV SV:        90 ml RIGHT VENTRICLE            IVC RV Basal diam:  4.60 cm    IVC diam: 2.30 cm RV S prime:     7.51 cm/s TAPSE (M-mode): 1.4 cm LEFT ATRIUM  Index         RIGHT ATRIUM            Index LA diam:        4.50 cm  2.90 cm/m    RA Area:     24.10 cm LA Vol (A2C):   158.0 ml 101.92 ml/m  RA Volume:   71.00 ml  45.80 ml/m LA Vol (A4C):   83.5 ml  53.86 ml/m LA Biplane Vol: 117.0 ml 75.47 ml/m  AORTIC VALVE AV Area (Vmax):    1.44 cm AV Area (Vmean):   1.37 cm AV Area (VTI):     1.48 cm AV Vmax:           395.00 cm/s AV Vmean:          280.000 cm/s AV VTI:            0.853 m AV Peak Grad:      62.4 mmHg AV Mean Grad:      36.0 mmHg LVOT Vmax:         150.00 cm/s LVOT Vmean:        101.000 cm/s LVOT VTI:          0.332 m LVOT/AV VTI ratio: 0.39  AORTA Ao Root diam: 2.80 cm Ao Asc diam:  2.80 cm MITRAL VALVE                TRICUSPID VALVE MV Area (PHT): 3.48 cm     TR Peak grad:   49.6 mmHg MV Area VTI:   2.75 cm     TR Vmax:        352.00 cm/s MV Peak grad:  13.1 mmHg MV Mean grad:  5.0 mmHg     SHUNTS MV Vmax:       1.81 m/s     Systemic VTI:  0.33 m MV Vmean:      109.0 cm/s   Systemic Diam: 2.20 cm MV Decel Time: 218 msec MV E velocity: 162.00 cm/s MV A velocity: 126.00 cm/s MV E/A ratio:  1.29 Dalton McleanMD Electronically signed by Wilfred Lacy Signature Date/Time: 04/21/2023/10:56:31 AM    Final    CT CHEST ABDOMEN PELVIS WO CONTRAST Result Date: 04/20/2023 CLINICAL DATA:  Sepsis. EXAM: CT CHEST, ABDOMEN AND PELVIS WITHOUT CONTRAST TECHNIQUE: Multidetector CT imaging of the chest, abdomen and pelvis was performed following the standard protocol without IV contrast. RADIATION DOSE REDUCTION: This exam was performed according to the departmental dose-optimization program which includes automated exposure control, adjustment of the mA and/or kV according to patient size and/or use of iterative reconstruction technique. COMPARISON:  CT abdomen pelvis 03/31/2023, CT chest 07/24/2022. FINDINGS: CT CHEST FINDINGS Cardiovascular: Atherosclerotic calcification of the aorta. Aortic valve repair. Three-vessel coronary artery calcification. Enlarged pulmonic trunk and heart. No pericardial  effusion. Mediastinum/Nodes: No pathologically enlarged mediastinal or axillary lymph nodes. Hilar regions are difficult to definitively evaluate without IV contrast. Debris and air in a dilated esophagus. Lungs/Pleura: Very minimal patchy ground-glass in the right upper lobe, likely infectious/inflammatory in etiology. A few scattered pulmonary nodules measure 5 mm or less in size, unchanged. Small right pleural effusion and trace left pleural effusion with dependent atelectasis in the lower lobes. No pleural fluid. Airway is unremarkable. Musculoskeletal: Degenerative changes in the spine. Increased bone density is in keeping with renal osteodystrophy. CT ABDOMEN PELVIS FINDINGS Hepatobiliary: Probable small cyst in the inferior right hepatic lobe. No specific follow-up necessary. Gallbladder is minimally prominent and the wall appears slightly thickened, unchanged  from 07/24/2022. No biliary ductal dilatation. Pancreas: Negative. Spleen: Negative. Adrenals/Urinary Tract: Adrenal glands are unremarkable. Numerous low-attenuation lesions in the kidneys. No specific follow-up necessary. Bilateral renal stones. Ureters are decompressed. Bladder is very low in volume. Stomach/Bowel: Similar chronic distal gastric wall thickening. Stomach, small bowel and appendix are otherwise grossly unremarkable. Slight thickening of the wall of the ascending colon, new from 03/31/2023. Colon is otherwise grossly unremarkable. Vascular/Lymphatic: Atherosclerotic calcification of the aorta. No pathologically enlarged lymph nodes. Reproductive: Uterus is visualized.  No adnexal mass. Other: Small ascites.  Diffuse body wall edema. Musculoskeletal: Degenerative changes in the spine. Increased bone density, indicative of renal osteodystrophy. L3-4 posterior lumbar interbody fusion. Old L2 compression fracture. IMPRESSION: 1. Slight ascending colonic wall thickening, new from 03/31/2023. Difficult to exclude colitis. 2. Tiny right and  trace left pleural effusions with minimal dependent atelectasis in both lower lobes. 3. Small ascites. 4. Debris and air in a dilated esophagus, indicative of dysmotility. 5. Small pulmonary nodules, stable from 07/24/2022. Recommend attention on follow-up. 6. Bilateral renal stones. 7. Aortic atherosclerosis (ICD10-I70.0). Three-vessel coronary artery calcification. 8. Enlarged pulmonic trunk, indicative of pulmonary arterial hypertension. Electronically Signed   By: Leanna Battles M.D.   On: 04/20/2023 16:30   DG Chest Port 1 View Result Date: 04/20/2023 CLINICAL DATA:  Questionable sepsis - evaluate for abnormality EXAM: PORTABLE CHEST 1 VIEW COMPARISON:  03/28/2023. FINDINGS: Low lung volume. There are atelectatic changes/scarring at the left lung base. Bilateral lung fields are otherwise clear. Bilateral costophrenic angles are clear. Note is made of elevated right hemidiaphragm. Stable mildly enlarged cardio-mediastinal silhouette. There are surgical staples along the heart border and sternotomy wires, status post CABG (coronary artery bypass graft). Prosthetic aortic valve seen. No acute osseous abnormalities. Lower cervical spinal fixation hardware partially seen. The soft tissues are within normal limits. IMPRESSION: No active disease. Electronically Signed   By: Jules Schick M.D.   On: 04/20/2023 10:21   US Abdomen Limited RUQ (LIVER/GB) Result Date: 04/01/2023 CLINICAL DATA:  Right upper quadrant pain EXAM: ULTRASOUND ABDOMEN LIMITED RIGHT UPPER QUADRANT COMPARISON:  CT from the previous day. FINDINGS: Gallbladder: Gallbladder is decompressed with wall thickening. This is in part due to the decompressed state as well as underlying ascites. No cholelithiasis is seen. Common bile duct: Diameter: 1.9 mm. Liver: Increase in echogenicity consistent with fatty infiltration. Portal vein is patent on color Doppler imaging with normal direction of blood flow towards the liver. Other: None. IMPRESSION:  Decompressed gallbladder with wall thickening accentuated by ascites. Correlate with the physical exam. HIDA scan may be helpful to assess for gallbladder function. Fatty liver. Electronically Signed   By: Alcide Clever M.D.   On: 04/01/2023 00:50   CT ABDOMEN PELVIS WO CONTRAST Result Date: 03/31/2023 CLINICAL DATA:  Acute left-sided abdominal pain. EXAM: CT ABDOMEN AND PELVIS WITHOUT CONTRAST TECHNIQUE: Multidetector CT imaging of the abdomen and pelvis was performed following the standard protocol without IV contrast. RADIATION DOSE REDUCTION: This exam was performed according to the departmental dose-optimization program which includes automated exposure control, adjustment of the mA and/or kV according to patient size and/or use of iterative reconstruction technique. COMPARISON:  January 02, 2023. FINDINGS: Lower chest: No acute abnormality. Hepatobiliary: Nodular hepatic contours are noted suggesting hepatic cirrhosis. Moderate gallbladder wall thickening is noted without cholelithiasis. Pancreas: Unremarkable. No pancreatic ductal dilatation or surrounding inflammatory changes. Spleen: Normal in size without focal abnormality. Adrenals/Urinary Tract: Adrenal glands are unremarkable. Bilateral renal cysts are noted for which no further follow-up  is required. No hydronephrosis or renal obstruction is noted. Stomach/Bowel: Stomach is unremarkable. There is no evidence of bowel obstruction or inflammation. The appendix appears normal. Diverticulosis of transverse colon is noted without inflammation. Vascular/Lymphatic: Aortic atherosclerosis. No enlarged abdominal or pelvic lymph nodes. Reproductive: Uterus and bilateral adnexa are unremarkable. Other: Minimal ascites is noted around the liver and spleen. No hernia. Musculoskeletal: Status post surgical posterior fusion of L3-4 with bilateral intrapedicular screw placement. 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 IMPRESSION:  Nodular hepatic contours are noted suggesting hepatic cirrhosis. Moderate gallbladder wall thickening is noted without cholelithiasis. Ultrasound is recommended for further evaluation. Minimal ascites. Diverticulosis of transverse colon without inflammation. Aortic Atherosclerosis (ICD10-I70.0). Electronically Signed   By: Lupita Raider M.D.   On: 03/31/2023 16:38    Microbiology Recent Results (from the past 240 hours)  Blood Culture (routine x 2)     Status: None   Collection Time: 04/20/23  9:14 AM   Specimen: BLOOD RIGHT HAND  Result Value Ref Range Status   Specimen Description BLOOD RIGHT HAND  Final   Special Requests   Final    BOTTLES DRAWN AEROBIC ONLY Blood Culture results may not be optimal due to an inadequate volume of blood received in culture bottles   Culture   Final    NO GROWTH 5 DAYS Performed at [facility], [address] [place], [place] [address]    Report Status 04/25/2023 FINAL  Final  Blood Culture (routine x 2)     Status: None   Collection Time: 04/20/23  9:37 AM   Specimen: BLOOD RIGHT ARM  Result Value Ref Range Status   Specimen Description BLOOD RIGHT ARM  Final   Special Requests   Final    BOTTLES DRAWN AEROBIC AND ANAEROBIC Blood Culture results may not be optimal due to an inadequate volume of blood received in culture bottles   Culture   Final    NO GROWTH 5 DAYS Performed at [facility], [address], [place], [place] [address]    Report Status 04/25/2023 FINAL  Final  MRSA Next Gen by PCR, Nasal     Status: None   Collection Time: 04/24/23 10:40 PM   Specimen: Nasal Mucosa; Nasal Swab  Result Value Ref Range Status   MRSA by PCR Next Gen NOT DETECTED NOT DETECTED Final    Comment: (NOTE) The GeneXpert MRSA Assay (FDA approved for NASAL specimens only), is one component of a comprehensive MRSA colonization surveillance program. It is not intended to diagnose MRSA infection nor to guide or monitor treatment for MRSA  infections. Test performance is not FDA approved in patients less than 32 years old. Performed at [facility], [address], [place], [place] [address]     Lab Basic Metabolic Panel: Recent Labs  Lab 04/23/23 0204 04/24/23 0419 04/24/23 1224 04/24/23 1231 04/25/23 0252  NA 132* 128* 132* 128* 126*  K 3.4* 4.0 4.3 4.1 4.1  CL 96* 94* 97*  --  95*  CO2 26 26 22   --  17*  GLUCOSE 64* 88 198*  --  82  BUN 13 20 15   --  19  CREATININE 6.19* 7.23* 6.09*  --  6.42*  CALCIUM 8.3* 8.0* 7.8*  --  7.7*  MG  --   --  1.4*  --   --   PHOS  --   --  3.2  --   --    Liver Function  Tests: Recent Labs  Lab 04/24/23 1224  AST 24  ALT 9  ALKPHOS 62  BILITOT 0.5  PROT <3.0*  ALBUMIN <1.5*   No results for input(s): "LIPASE", "AMYLASE" in the last 168 hours. No results for input(s): "AMMONIA" in the last 168 hours. CBC: Recent Labs  Lab 04/22/23 1055 04/23/23 0204 04/23/23 2126 04/24/23 0419 04/24/23 1224 04/24/23 1231 04/24/23 1928 04/25/23 0252  WBC 27.8*   < > 33.0* 24.9* 6.1  --  3.6* 5.7  NEUTROABS 1.6*  --  5.3  --   --   --  2.1  --   HGB 11.9*   < > 11.9* 11.7* 9.5* 11.2* 11.8* 10.9*  HCT 38.0   < > 38.1 37.8 30.9* 33.0* 36.4 33.4*  MCV 96.2   < > 96.2 95.2 96.0  --  91.0 90.8  PLT 53*   < > 46* 44* 30*  29*  --  36* 37*   < > = values in this interval not displayed.   Cardiac Enzymes: No results for input(s): "CKTOTAL", "CKMB", "CKMBINDEX", "TROPONINI" in the last 168 hours. Sepsis Labs: Recent Labs  Lab 04/24/23 0419 04/24/23 1224 04/24/23 1928 04/25/23 0252  WBC 24.9* 6.1 3.6* 5.7    Laretta Bolster, M.D.  Internal Medicine Resident, PGY-1 [facility]

## 2023-05-12 DEATH — deceased

## 2023-05-13 ENCOUNTER — Ambulatory Visit: Payer: Medicare PPO | Admitting: Cardiology

## 2023-07-02 MED FILL — Medication: Qty: 1 | Status: AC

## 2023-12-14 ENCOUNTER — Ambulatory Visit: Payer: Medicare PPO | Admitting: Physician Assistant

## 2023-12-14 ENCOUNTER — Other Ambulatory Visit (HOSPITAL_COMMUNITY): Payer: Medicare PPO

## 2023-12-25 ENCOUNTER — Other Ambulatory Visit (HOSPITAL_COMMUNITY): Payer: Medicare PPO

## 2023-12-25 ENCOUNTER — Ambulatory Visit: Payer: Medicare PPO
